# Patient Record
Sex: Female | Born: 1940 | Race: White | Hispanic: No | Marital: Married | Smoking: Never smoker
Health system: Southern US, Community
[De-identification: ages and names within clinical notes are randomized; demographics above are authoritative.]

## PROBLEM LIST (undated history)

## (undated) DIAGNOSIS — F419 Anxiety disorder, unspecified: Secondary | ICD-10-CM

## (undated) DIAGNOSIS — D45 Polycythemia vera: Principal | ICD-10-CM

## (undated) DIAGNOSIS — H9190 Unspecified hearing loss, unspecified ear: Secondary | ICD-10-CM

## (undated) DIAGNOSIS — E785 Hyperlipidemia, unspecified: Secondary | ICD-10-CM

## (undated) DIAGNOSIS — Z9981 Dependence on supplemental oxygen: Secondary | ICD-10-CM

## (undated) DIAGNOSIS — K219 Gastro-esophageal reflux disease without esophagitis: Secondary | ICD-10-CM

## (undated) DIAGNOSIS — L0390 Cellulitis, unspecified: Secondary | ICD-10-CM

## (undated) DIAGNOSIS — I509 Heart failure, unspecified: Secondary | ICD-10-CM

## (undated) DIAGNOSIS — D500 Iron deficiency anemia secondary to blood loss (chronic): Principal | ICD-10-CM

## (undated) DIAGNOSIS — I8390 Asymptomatic varicose veins of unspecified lower extremity: Secondary | ICD-10-CM

## (undated) DIAGNOSIS — E039 Hypothyroidism, unspecified: Secondary | ICD-10-CM

## (undated) DIAGNOSIS — G629 Polyneuropathy, unspecified: Secondary | ICD-10-CM

## (undated) DIAGNOSIS — J449 Chronic obstructive pulmonary disease, unspecified: Secondary | ICD-10-CM

## (undated) DIAGNOSIS — I96 Gangrene, not elsewhere classified: Secondary | ICD-10-CM

## (undated) DIAGNOSIS — J9611 Chronic respiratory failure with hypoxia: Secondary | ICD-10-CM

## (undated) DIAGNOSIS — I10 Essential (primary) hypertension: Secondary | ICD-10-CM

## (undated) DIAGNOSIS — I739 Peripheral vascular disease, unspecified: Secondary | ICD-10-CM

## (undated) DIAGNOSIS — L97309 Non-pressure chronic ulcer of unspecified ankle with unspecified severity: Secondary | ICD-10-CM

## (undated) DIAGNOSIS — I482 Chronic atrial fibrillation, unspecified: Secondary | ICD-10-CM

## (undated) DIAGNOSIS — T148XXA Other injury of unspecified body region, initial encounter: Secondary | ICD-10-CM

## (undated) DIAGNOSIS — M1990 Unspecified osteoarthritis, unspecified site: Secondary | ICD-10-CM

## (undated) DIAGNOSIS — I82409 Acute embolism and thrombosis of unspecified deep veins of unspecified lower extremity: Secondary | ICD-10-CM

## (undated) HISTORY — DX: Peripheral vascular disease, unspecified: I73.9

## (undated) HISTORY — DX: Polycythemia vera: D45

## (undated) HISTORY — DX: Unspecified hearing loss, unspecified ear: H91.90

## (undated) HISTORY — PX: ABDOMINAL HYSTERECTOMY: SHX81

## (undated) HISTORY — DX: Acute embolism and thrombosis of unspecified deep veins of unspecified lower extremity: I82.409

## (undated) HISTORY — PX: TUBAL LIGATION: SHX77

## (undated) HISTORY — DX: Asymptomatic varicose veins of unspecified lower extremity: I83.90

## (undated) HISTORY — DX: Non-pressure chronic ulcer of unspecified ankle with unspecified severity: L97.309

## (undated) HISTORY — DX: Heart failure, unspecified: I50.9

## (undated) HISTORY — PX: CORONARY ANGIOPLASTY: SHX604

## (undated) HISTORY — DX: Iron deficiency anemia secondary to blood loss (chronic): D50.0

## (undated) HISTORY — DX: Unspecified osteoarthritis, unspecified site: M19.90

## (undated) HISTORY — DX: Polyneuropathy, unspecified: G62.9

---

## 1999-05-20 ENCOUNTER — Ambulatory Visit (HOSPITAL_COMMUNITY): Admission: RE | Admit: 1999-05-20 | Discharge: 1999-05-20 | Payer: Self-pay | Admitting: Cardiovascular Disease

## 2004-01-18 ENCOUNTER — Ambulatory Visit (HOSPITAL_COMMUNITY): Admission: RE | Admit: 2004-01-18 | Discharge: 2004-01-18 | Payer: Self-pay | Admitting: Family Medicine

## 2004-01-19 ENCOUNTER — Ambulatory Visit (HOSPITAL_COMMUNITY): Admission: RE | Admit: 2004-01-19 | Discharge: 2004-01-19 | Payer: Self-pay | Admitting: Family Medicine

## 2004-03-25 ENCOUNTER — Encounter (HOSPITAL_COMMUNITY): Admission: RE | Admit: 2004-03-25 | Discharge: 2004-04-24 | Payer: Self-pay | Admitting: Oncology

## 2004-03-25 ENCOUNTER — Encounter: Admission: RE | Admit: 2004-03-25 | Discharge: 2004-03-25 | Payer: Self-pay | Admitting: Oncology

## 2004-04-26 ENCOUNTER — Encounter: Admission: RE | Admit: 2004-04-26 | Discharge: 2004-05-03 | Payer: Self-pay | Admitting: Oncology

## 2004-04-26 ENCOUNTER — Encounter (HOSPITAL_COMMUNITY): Admission: RE | Admit: 2004-04-26 | Discharge: 2004-05-03 | Payer: Self-pay | Admitting: Oncology

## 2004-05-09 ENCOUNTER — Encounter (HOSPITAL_COMMUNITY): Admission: RE | Admit: 2004-05-09 | Discharge: 2004-06-08 | Payer: Self-pay | Admitting: Oncology

## 2004-05-09 ENCOUNTER — Encounter: Admission: RE | Admit: 2004-05-09 | Discharge: 2004-05-09 | Payer: Self-pay | Admitting: Oncology

## 2004-06-26 ENCOUNTER — Encounter (HOSPITAL_COMMUNITY): Admission: RE | Admit: 2004-06-26 | Discharge: 2004-07-26 | Payer: Self-pay | Admitting: Oncology

## 2004-06-26 ENCOUNTER — Ambulatory Visit (HOSPITAL_COMMUNITY): Payer: Self-pay | Admitting: Oncology

## 2004-06-26 ENCOUNTER — Encounter: Admission: RE | Admit: 2004-06-26 | Discharge: 2004-06-26 | Payer: Self-pay | Admitting: Oncology

## 2004-07-31 ENCOUNTER — Encounter (HOSPITAL_COMMUNITY): Admission: RE | Admit: 2004-07-31 | Discharge: 2004-08-30 | Payer: Self-pay | Admitting: Oncology

## 2004-07-31 ENCOUNTER — Encounter: Admission: RE | Admit: 2004-07-31 | Discharge: 2004-07-31 | Payer: Self-pay | Admitting: Oncology

## 2004-08-12 ENCOUNTER — Ambulatory Visit (HOSPITAL_COMMUNITY): Payer: Self-pay | Admitting: Oncology

## 2004-09-06 ENCOUNTER — Encounter (HOSPITAL_COMMUNITY): Admission: RE | Admit: 2004-09-06 | Discharge: 2004-10-06 | Payer: Self-pay | Admitting: Oncology

## 2004-09-06 ENCOUNTER — Encounter: Admission: RE | Admit: 2004-09-06 | Discharge: 2004-09-06 | Payer: Self-pay | Admitting: Oncology

## 2004-10-04 ENCOUNTER — Ambulatory Visit (HOSPITAL_COMMUNITY): Payer: Self-pay | Admitting: Oncology

## 2004-11-01 ENCOUNTER — Encounter: Admission: RE | Admit: 2004-11-01 | Discharge: 2004-11-01 | Payer: Self-pay | Admitting: Oncology

## 2004-11-01 ENCOUNTER — Encounter (HOSPITAL_COMMUNITY): Admission: RE | Admit: 2004-11-01 | Discharge: 2004-12-01 | Payer: Self-pay | Admitting: Oncology

## 2004-11-29 ENCOUNTER — Ambulatory Visit (HOSPITAL_COMMUNITY): Payer: Self-pay | Admitting: Oncology

## 2004-12-27 ENCOUNTER — Encounter: Admission: RE | Admit: 2004-12-27 | Discharge: 2004-12-27 | Payer: Self-pay | Admitting: Oncology

## 2004-12-27 ENCOUNTER — Encounter (HOSPITAL_COMMUNITY): Admission: RE | Admit: 2004-12-27 | Discharge: 2005-01-26 | Payer: Self-pay | Admitting: Oncology

## 2005-01-24 ENCOUNTER — Ambulatory Visit (HOSPITAL_COMMUNITY): Payer: Self-pay | Admitting: Oncology

## 2005-01-30 ENCOUNTER — Ambulatory Visit (HOSPITAL_COMMUNITY): Admission: RE | Admit: 2005-01-30 | Discharge: 2005-01-30 | Payer: Self-pay | Admitting: Family Medicine

## 2005-02-21 ENCOUNTER — Encounter: Admission: RE | Admit: 2005-02-21 | Discharge: 2005-02-21 | Payer: Self-pay | Admitting: Oncology

## 2005-02-21 ENCOUNTER — Encounter (HOSPITAL_COMMUNITY): Admission: RE | Admit: 2005-02-21 | Discharge: 2005-03-23 | Payer: Self-pay | Admitting: Oncology

## 2005-02-24 ENCOUNTER — Ambulatory Visit (HOSPITAL_COMMUNITY): Admission: RE | Admit: 2005-02-24 | Discharge: 2005-02-24 | Payer: Self-pay | Admitting: Family Medicine

## 2005-03-21 ENCOUNTER — Ambulatory Visit (HOSPITAL_COMMUNITY): Payer: Self-pay | Admitting: Oncology

## 2005-04-18 ENCOUNTER — Encounter: Admission: RE | Admit: 2005-04-18 | Discharge: 2005-05-02 | Payer: Self-pay | Admitting: Oncology

## 2005-04-18 ENCOUNTER — Encounter (HOSPITAL_COMMUNITY): Admission: RE | Admit: 2005-04-18 | Discharge: 2005-05-02 | Payer: Self-pay | Admitting: Oncology

## 2005-05-16 ENCOUNTER — Encounter: Admission: RE | Admit: 2005-05-16 | Discharge: 2005-05-16 | Payer: Self-pay | Admitting: Oncology

## 2005-05-16 ENCOUNTER — Encounter (HOSPITAL_COMMUNITY): Admission: RE | Admit: 2005-05-16 | Discharge: 2005-06-15 | Payer: Self-pay | Admitting: Oncology

## 2005-05-16 ENCOUNTER — Ambulatory Visit (HOSPITAL_COMMUNITY): Payer: Self-pay | Admitting: Oncology

## 2005-07-17 ENCOUNTER — Encounter (HOSPITAL_COMMUNITY): Admission: RE | Admit: 2005-07-17 | Discharge: 2005-07-17 | Payer: Self-pay | Admitting: Oncology

## 2005-07-17 ENCOUNTER — Ambulatory Visit (HOSPITAL_COMMUNITY): Payer: Self-pay | Admitting: Oncology

## 2005-07-17 ENCOUNTER — Encounter: Admission: RE | Admit: 2005-07-17 | Discharge: 2005-07-17 | Payer: Self-pay | Admitting: Oncology

## 2005-08-14 ENCOUNTER — Encounter (HOSPITAL_COMMUNITY): Admission: RE | Admit: 2005-08-14 | Discharge: 2005-09-13 | Payer: Self-pay | Admitting: Oncology

## 2005-08-14 ENCOUNTER — Encounter: Admission: RE | Admit: 2005-08-14 | Discharge: 2005-08-14 | Payer: Self-pay | Admitting: Oncology

## 2005-09-08 ENCOUNTER — Ambulatory Visit (HOSPITAL_COMMUNITY): Payer: Self-pay | Admitting: Oncology

## 2005-09-15 ENCOUNTER — Encounter (HOSPITAL_COMMUNITY): Admission: RE | Admit: 2005-09-15 | Discharge: 2005-10-15 | Payer: Self-pay | Admitting: Oncology

## 2005-09-15 ENCOUNTER — Encounter: Admission: RE | Admit: 2005-09-15 | Discharge: 2005-09-15 | Payer: Self-pay | Admitting: Oncology

## 2005-10-27 ENCOUNTER — Encounter (HOSPITAL_COMMUNITY): Admission: RE | Admit: 2005-10-27 | Discharge: 2005-11-26 | Payer: Self-pay | Admitting: Oncology

## 2005-10-27 ENCOUNTER — Ambulatory Visit (HOSPITAL_COMMUNITY): Payer: Self-pay | Admitting: Oncology

## 2005-10-27 ENCOUNTER — Encounter: Admission: RE | Admit: 2005-10-27 | Discharge: 2005-10-27 | Payer: Self-pay | Admitting: Oncology

## 2005-12-08 ENCOUNTER — Encounter (HOSPITAL_COMMUNITY): Admission: RE | Admit: 2005-12-08 | Discharge: 2006-01-07 | Payer: Self-pay | Admitting: Oncology

## 2005-12-08 ENCOUNTER — Encounter: Admission: RE | Admit: 2005-12-08 | Discharge: 2005-12-08 | Payer: Self-pay | Admitting: Oncology

## 2006-01-19 ENCOUNTER — Encounter: Admission: RE | Admit: 2006-01-19 | Discharge: 2006-01-19 | Payer: Self-pay | Admitting: Oncology

## 2006-01-19 ENCOUNTER — Encounter (HOSPITAL_COMMUNITY): Admission: RE | Admit: 2006-01-19 | Discharge: 2006-02-18 | Payer: Self-pay | Admitting: Oncology

## 2006-01-19 ENCOUNTER — Ambulatory Visit (HOSPITAL_COMMUNITY): Payer: Self-pay | Admitting: Oncology

## 2006-02-25 ENCOUNTER — Ambulatory Visit (HOSPITAL_COMMUNITY): Admission: RE | Admit: 2006-02-25 | Discharge: 2006-02-25 | Payer: Self-pay | Admitting: Family Medicine

## 2006-03-02 ENCOUNTER — Encounter (HOSPITAL_COMMUNITY): Admission: RE | Admit: 2006-03-02 | Discharge: 2006-04-01 | Payer: Self-pay | Admitting: Oncology

## 2006-03-02 ENCOUNTER — Encounter: Admission: RE | Admit: 2006-03-02 | Discharge: 2006-03-02 | Payer: Self-pay | Admitting: Oncology

## 2006-03-23 ENCOUNTER — Ambulatory Visit (HOSPITAL_COMMUNITY): Payer: Self-pay | Admitting: Oncology

## 2006-04-13 ENCOUNTER — Encounter: Admission: RE | Admit: 2006-04-13 | Discharge: 2006-05-01 | Payer: Self-pay | Admitting: Oncology

## 2006-04-13 ENCOUNTER — Encounter (HOSPITAL_COMMUNITY): Admission: RE | Admit: 2006-04-13 | Discharge: 2006-05-01 | Payer: Self-pay | Admitting: Oncology

## 2006-05-25 ENCOUNTER — Encounter: Admission: RE | Admit: 2006-05-25 | Discharge: 2006-05-25 | Payer: Self-pay | Admitting: Oncology

## 2006-05-25 ENCOUNTER — Encounter (HOSPITAL_COMMUNITY): Admission: RE | Admit: 2006-05-25 | Discharge: 2006-06-24 | Payer: Self-pay | Admitting: Oncology

## 2006-05-25 ENCOUNTER — Ambulatory Visit (HOSPITAL_COMMUNITY): Payer: Self-pay | Admitting: Oncology

## 2006-07-06 ENCOUNTER — Encounter (HOSPITAL_COMMUNITY): Admission: RE | Admit: 2006-07-06 | Discharge: 2006-08-03 | Payer: Self-pay | Admitting: Oncology

## 2006-08-17 ENCOUNTER — Ambulatory Visit (HOSPITAL_COMMUNITY): Payer: Self-pay | Admitting: Oncology

## 2006-08-17 ENCOUNTER — Encounter (HOSPITAL_COMMUNITY): Admission: RE | Admit: 2006-08-17 | Discharge: 2006-09-16 | Payer: Self-pay | Admitting: Oncology

## 2006-09-28 ENCOUNTER — Encounter (HOSPITAL_COMMUNITY): Admission: RE | Admit: 2006-09-28 | Discharge: 2006-10-28 | Payer: Self-pay | Admitting: Oncology

## 2006-11-09 ENCOUNTER — Encounter (HOSPITAL_COMMUNITY): Admission: RE | Admit: 2006-11-09 | Discharge: 2006-12-09 | Payer: Self-pay | Admitting: Oncology

## 2006-11-09 ENCOUNTER — Ambulatory Visit (HOSPITAL_COMMUNITY): Payer: Self-pay | Admitting: Oncology

## 2006-12-21 ENCOUNTER — Encounter (HOSPITAL_COMMUNITY): Admission: RE | Admit: 2006-12-21 | Discharge: 2007-01-20 | Payer: Self-pay | Admitting: Oncology

## 2007-02-01 ENCOUNTER — Encounter (HOSPITAL_COMMUNITY): Admission: RE | Admit: 2007-02-01 | Discharge: 2007-03-03 | Payer: Self-pay | Admitting: Oncology

## 2007-02-01 ENCOUNTER — Ambulatory Visit (HOSPITAL_COMMUNITY): Payer: Self-pay | Admitting: Oncology

## 2007-03-15 ENCOUNTER — Encounter (HOSPITAL_COMMUNITY): Admission: RE | Admit: 2007-03-15 | Discharge: 2007-04-14 | Payer: Self-pay | Admitting: Oncology

## 2007-03-29 ENCOUNTER — Ambulatory Visit (HOSPITAL_COMMUNITY): Admission: RE | Admit: 2007-03-29 | Discharge: 2007-03-29 | Payer: Self-pay | Admitting: Family Medicine

## 2007-04-12 ENCOUNTER — Ambulatory Visit (HOSPITAL_COMMUNITY): Payer: Self-pay | Admitting: Oncology

## 2007-04-26 ENCOUNTER — Encounter (HOSPITAL_COMMUNITY): Admission: RE | Admit: 2007-04-26 | Discharge: 2007-05-04 | Payer: Self-pay | Admitting: Oncology

## 2007-06-07 ENCOUNTER — Encounter (HOSPITAL_COMMUNITY): Admission: RE | Admit: 2007-06-07 | Discharge: 2007-07-07 | Payer: Self-pay | Admitting: Oncology

## 2007-06-07 ENCOUNTER — Ambulatory Visit (HOSPITAL_COMMUNITY): Payer: Self-pay | Admitting: Oncology

## 2007-07-19 ENCOUNTER — Encounter (HOSPITAL_COMMUNITY): Admission: RE | Admit: 2007-07-19 | Discharge: 2007-08-04 | Payer: Self-pay | Admitting: Oncology

## 2007-08-30 ENCOUNTER — Ambulatory Visit (HOSPITAL_COMMUNITY): Payer: Self-pay | Admitting: Oncology

## 2007-08-30 ENCOUNTER — Encounter (HOSPITAL_COMMUNITY): Admission: RE | Admit: 2007-08-30 | Discharge: 2007-09-29 | Payer: Self-pay | Admitting: Oncology

## 2007-11-22 ENCOUNTER — Encounter (HOSPITAL_COMMUNITY): Admission: RE | Admit: 2007-11-22 | Discharge: 2007-12-22 | Payer: Self-pay | Admitting: Oncology

## 2007-11-22 ENCOUNTER — Ambulatory Visit (HOSPITAL_COMMUNITY): Payer: Self-pay | Admitting: Oncology

## 2008-01-03 ENCOUNTER — Encounter (HOSPITAL_COMMUNITY): Admission: RE | Admit: 2008-01-03 | Discharge: 2008-02-02 | Payer: Self-pay | Admitting: Oncology

## 2008-02-14 ENCOUNTER — Ambulatory Visit (HOSPITAL_COMMUNITY): Payer: Self-pay | Admitting: Oncology

## 2008-02-14 ENCOUNTER — Encounter (HOSPITAL_COMMUNITY): Admission: RE | Admit: 2008-02-14 | Discharge: 2008-03-15 | Payer: Self-pay | Admitting: Oncology

## 2008-03-27 ENCOUNTER — Encounter (HOSPITAL_COMMUNITY): Admission: RE | Admit: 2008-03-27 | Discharge: 2008-04-26 | Payer: Self-pay | Admitting: Oncology

## 2008-03-29 ENCOUNTER — Ambulatory Visit (HOSPITAL_COMMUNITY): Admission: RE | Admit: 2008-03-29 | Discharge: 2008-03-29 | Payer: Self-pay | Admitting: Family Medicine

## 2008-04-11 ENCOUNTER — Ambulatory Visit (HOSPITAL_COMMUNITY): Payer: Self-pay | Admitting: Oncology

## 2008-05-08 ENCOUNTER — Encounter (HOSPITAL_COMMUNITY): Admission: RE | Admit: 2008-05-08 | Discharge: 2008-06-07 | Payer: Self-pay | Admitting: Oncology

## 2008-06-28 ENCOUNTER — Encounter (HOSPITAL_COMMUNITY): Admission: RE | Admit: 2008-06-28 | Discharge: 2008-07-28 | Payer: Self-pay | Admitting: Oncology

## 2008-06-28 ENCOUNTER — Ambulatory Visit (HOSPITAL_COMMUNITY): Payer: Self-pay | Admitting: Oncology

## 2008-08-09 ENCOUNTER — Encounter (HOSPITAL_COMMUNITY): Admission: RE | Admit: 2008-08-09 | Discharge: 2008-09-08 | Payer: Self-pay | Admitting: Oncology

## 2008-09-20 ENCOUNTER — Encounter (HOSPITAL_COMMUNITY): Admission: RE | Admit: 2008-09-20 | Discharge: 2008-10-20 | Payer: Self-pay | Admitting: Oncology

## 2008-09-20 ENCOUNTER — Ambulatory Visit (HOSPITAL_COMMUNITY): Payer: Self-pay | Admitting: Oncology

## 2008-11-28 ENCOUNTER — Encounter (HOSPITAL_COMMUNITY): Admission: RE | Admit: 2008-11-28 | Discharge: 2008-12-28 | Payer: Self-pay | Admitting: Oncology

## 2008-11-28 ENCOUNTER — Ambulatory Visit (HOSPITAL_COMMUNITY): Payer: Self-pay | Admitting: Oncology

## 2009-01-09 ENCOUNTER — Encounter (HOSPITAL_COMMUNITY): Admission: RE | Admit: 2009-01-09 | Discharge: 2009-02-08 | Payer: Self-pay | Admitting: Oncology

## 2009-02-20 ENCOUNTER — Ambulatory Visit (HOSPITAL_COMMUNITY): Payer: Self-pay | Admitting: Oncology

## 2009-02-20 ENCOUNTER — Encounter (HOSPITAL_COMMUNITY): Admission: RE | Admit: 2009-02-20 | Discharge: 2009-03-22 | Payer: Self-pay | Admitting: Oncology

## 2009-03-31 ENCOUNTER — Ambulatory Visit (HOSPITAL_COMMUNITY): Admission: RE | Admit: 2009-03-31 | Discharge: 2009-03-31 | Payer: Self-pay | Admitting: Family Medicine

## 2009-04-03 ENCOUNTER — Encounter (HOSPITAL_COMMUNITY): Admission: RE | Admit: 2009-04-03 | Discharge: 2009-05-02 | Payer: Self-pay | Admitting: Oncology

## 2009-04-23 ENCOUNTER — Ambulatory Visit (HOSPITAL_COMMUNITY): Payer: Self-pay | Admitting: Internal Medicine

## 2009-05-12 ENCOUNTER — Emergency Department (HOSPITAL_COMMUNITY): Admission: EM | Admit: 2009-05-12 | Discharge: 2009-05-12 | Payer: Self-pay | Admitting: Emergency Medicine

## 2009-05-15 ENCOUNTER — Encounter (HOSPITAL_COMMUNITY): Admission: RE | Admit: 2009-05-15 | Discharge: 2009-06-14 | Payer: Self-pay | Admitting: Oncology

## 2009-05-15 ENCOUNTER — Ambulatory Visit (HOSPITAL_COMMUNITY): Payer: Self-pay | Admitting: Oncology

## 2009-06-19 ENCOUNTER — Encounter (HOSPITAL_COMMUNITY): Admission: RE | Admit: 2009-06-19 | Discharge: 2009-07-19 | Payer: Self-pay | Admitting: Oncology

## 2009-07-06 ENCOUNTER — Encounter (INDEPENDENT_AMBULATORY_CARE_PROVIDER_SITE_OTHER): Payer: Self-pay | Admitting: Cardiology

## 2009-07-06 ENCOUNTER — Ambulatory Visit (HOSPITAL_COMMUNITY): Admission: RE | Admit: 2009-07-06 | Discharge: 2009-07-06 | Payer: Self-pay | Admitting: Cardiology

## 2009-07-18 ENCOUNTER — Inpatient Hospital Stay (HOSPITAL_COMMUNITY): Admission: EM | Admit: 2009-07-18 | Discharge: 2009-07-20 | Payer: Self-pay | Admitting: Emergency Medicine

## 2009-07-18 ENCOUNTER — Ambulatory Visit: Payer: Self-pay | Admitting: Cardiology

## 2009-07-19 ENCOUNTER — Ambulatory Visit: Payer: Self-pay | Admitting: Oncology

## 2009-07-19 ENCOUNTER — Encounter (INDEPENDENT_AMBULATORY_CARE_PROVIDER_SITE_OTHER): Payer: Self-pay | Admitting: General Surgery

## 2009-08-16 ENCOUNTER — Ambulatory Visit (HOSPITAL_COMMUNITY): Payer: Self-pay | Admitting: Oncology

## 2009-08-16 ENCOUNTER — Encounter (HOSPITAL_COMMUNITY): Admission: RE | Admit: 2009-08-16 | Discharge: 2009-09-15 | Payer: Self-pay | Admitting: Oncology

## 2009-09-18 ENCOUNTER — Encounter (HOSPITAL_COMMUNITY): Admission: RE | Admit: 2009-09-18 | Discharge: 2009-10-18 | Payer: Self-pay | Admitting: Oncology

## 2009-10-12 ENCOUNTER — Ambulatory Visit (HOSPITAL_COMMUNITY): Payer: Self-pay | Admitting: Oncology

## 2009-10-13 ENCOUNTER — Emergency Department (HOSPITAL_COMMUNITY): Admission: EM | Admit: 2009-10-13 | Discharge: 2009-10-13 | Payer: Self-pay | Admitting: Emergency Medicine

## 2009-11-13 ENCOUNTER — Encounter (HOSPITAL_COMMUNITY): Admission: RE | Admit: 2009-11-13 | Discharge: 2009-12-13 | Payer: Self-pay | Admitting: Oncology

## 2009-12-11 ENCOUNTER — Ambulatory Visit (HOSPITAL_COMMUNITY): Payer: Self-pay | Admitting: Oncology

## 2010-01-08 ENCOUNTER — Encounter (HOSPITAL_COMMUNITY): Admission: RE | Admit: 2010-01-08 | Discharge: 2010-02-07 | Payer: Self-pay | Admitting: Oncology

## 2010-02-06 ENCOUNTER — Ambulatory Visit (HOSPITAL_COMMUNITY): Payer: Self-pay | Admitting: Oncology

## 2010-03-06 ENCOUNTER — Encounter (HOSPITAL_COMMUNITY): Admission: RE | Admit: 2010-03-06 | Discharge: 2010-04-05 | Payer: Self-pay | Admitting: Oncology

## 2010-04-03 ENCOUNTER — Ambulatory Visit (HOSPITAL_COMMUNITY): Payer: Self-pay | Admitting: Oncology

## 2010-04-15 ENCOUNTER — Encounter (HOSPITAL_COMMUNITY)
Admission: RE | Admit: 2010-04-15 | Discharge: 2010-05-03 | Payer: Self-pay | Source: Home / Self Care | Admitting: Oncology

## 2010-05-29 ENCOUNTER — Ambulatory Visit (HOSPITAL_COMMUNITY): Payer: Self-pay | Admitting: Oncology

## 2010-05-29 ENCOUNTER — Encounter (HOSPITAL_COMMUNITY)
Admission: RE | Admit: 2010-05-29 | Discharge: 2010-06-28 | Payer: Self-pay | Source: Home / Self Care | Admitting: Oncology

## 2010-07-23 ENCOUNTER — Ambulatory Visit (HOSPITAL_COMMUNITY): Payer: Self-pay | Admitting: Oncology

## 2010-07-23 ENCOUNTER — Encounter (HOSPITAL_COMMUNITY)
Admission: RE | Admit: 2010-07-23 | Discharge: 2010-08-22 | Payer: Self-pay | Source: Home / Self Care | Attending: Oncology | Admitting: Oncology

## 2010-08-21 LAB — CBC
HCT: 45.6 % (ref 36.0–46.0)
Hemoglobin: 15.5 g/dL — ABNORMAL HIGH (ref 12.0–15.0)
MCH: 34.8 pg — ABNORMAL HIGH (ref 26.0–34.0)
MCHC: 34 g/dL (ref 30.0–36.0)
MCV: 102.2 fL — ABNORMAL HIGH (ref 78.0–100.0)
Platelets: 358 10*3/uL (ref 150–400)
RBC: 4.46 MIL/uL (ref 3.87–5.11)
RDW: 15.6 % — ABNORMAL HIGH (ref 11.5–15.5)
WBC: 7.2 10*3/uL (ref 4.0–10.5)

## 2010-08-26 ENCOUNTER — Encounter: Payer: Self-pay | Admitting: Family Medicine

## 2010-09-17 ENCOUNTER — Other Ambulatory Visit (HOSPITAL_COMMUNITY): Payer: Medicare Other

## 2010-09-17 ENCOUNTER — Encounter (HOSPITAL_COMMUNITY): Payer: Medicare Other | Attending: Oncology

## 2010-09-17 DIAGNOSIS — D45 Polycythemia vera: Secondary | ICD-10-CM | POA: Insufficient documentation

## 2010-09-30 ENCOUNTER — Ambulatory Visit (HOSPITAL_COMMUNITY): Payer: Medicare Other | Admitting: Oncology

## 2010-09-30 DIAGNOSIS — D45 Polycythemia vera: Secondary | ICD-10-CM

## 2010-10-09 ENCOUNTER — Encounter (HOSPITAL_COMMUNITY): Payer: Medicare Other

## 2010-10-09 DIAGNOSIS — D45 Polycythemia vera: Secondary | ICD-10-CM

## 2010-10-14 LAB — CBC
HCT: 44.5 % (ref 36.0–46.0)
Hemoglobin: 15 g/dL (ref 12.0–15.0)
MCH: 34.7 pg — ABNORMAL HIGH (ref 26.0–34.0)
MCHC: 33.7 g/dL (ref 30.0–36.0)
MCV: 103 fL — ABNORMAL HIGH (ref 78.0–100.0)
Platelets: 299 10*3/uL (ref 150–400)
RBC: 4.32 MIL/uL (ref 3.87–5.11)
RDW: 16.5 % — ABNORMAL HIGH (ref 11.5–15.5)
WBC: 5.9 10*3/uL (ref 4.0–10.5)

## 2010-10-15 LAB — CBC
HCT: 44.3 % (ref 36.0–46.0)
Hemoglobin: 15.1 g/dL — ABNORMAL HIGH (ref 12.0–15.0)
MCH: 34.7 pg — ABNORMAL HIGH (ref 26.0–34.0)
MCHC: 34.1 g/dL (ref 30.0–36.0)
MCV: 101.8 fL — ABNORMAL HIGH (ref 78.0–100.0)
Platelets: 383 10*3/uL (ref 150–400)
RBC: 4.35 MIL/uL (ref 3.87–5.11)
RDW: 16.8 % — ABNORMAL HIGH (ref 11.5–15.5)
WBC: 7.5 10*3/uL (ref 4.0–10.5)

## 2010-10-16 LAB — CBC
HCT: 45.9 % (ref 36.0–46.0)
Hemoglobin: 14.9 g/dL (ref 12.0–15.0)
MCH: 34 pg (ref 26.0–34.0)
MCHC: 32.4 g/dL (ref 30.0–36.0)
MCV: 105 fL — ABNORMAL HIGH (ref 78.0–100.0)
Platelets: 400 10*3/uL (ref 150–400)
RBC: 4.37 MIL/uL (ref 3.87–5.11)
RDW: 18.1 % — ABNORMAL HIGH (ref 11.5–15.5)
WBC: 6.4 10*3/uL (ref 4.0–10.5)

## 2010-10-17 LAB — CBC
HCT: 46.4 % — ABNORMAL HIGH (ref 36.0–46.0)
HCT: 46.5 % — ABNORMAL HIGH (ref 36.0–46.0)
Hemoglobin: 14.8 g/dL (ref 12.0–15.0)
Hemoglobin: 15.1 g/dL — ABNORMAL HIGH (ref 12.0–15.0)
MCH: 33.3 pg (ref 26.0–34.0)
MCH: 33.8 pg (ref 26.0–34.0)
MCHC: 32 g/dL (ref 30.0–36.0)
MCHC: 32.5 g/dL (ref 30.0–36.0)
MCV: 103.7 fL — ABNORMAL HIGH (ref 78.0–100.0)
MCV: 104.2 fL — ABNORMAL HIGH (ref 78.0–100.0)
Platelets: 466 10*3/uL — ABNORMAL HIGH (ref 150–400)
Platelets: 481 10*3/uL — ABNORMAL HIGH (ref 150–400)
RBC: 4.46 MIL/uL (ref 3.87–5.11)
RBC: 4.49 MIL/uL (ref 3.87–5.11)
RDW: 17 % — ABNORMAL HIGH (ref 11.5–15.5)
RDW: 17 % — ABNORMAL HIGH (ref 11.5–15.5)
WBC: 7.1 10*3/uL (ref 4.0–10.5)
WBC: 8 10*3/uL (ref 4.0–10.5)

## 2010-10-18 LAB — CBC
HCT: 45 % (ref 36.0–46.0)
Hemoglobin: 14.6 g/dL (ref 12.0–15.0)
MCH: 34 pg (ref 26.0–34.0)
MCHC: 32.4 g/dL (ref 30.0–36.0)
MCV: 104.9 fL — ABNORMAL HIGH (ref 78.0–100.0)
Platelets: 468 10*3/uL — ABNORMAL HIGH (ref 150–400)
RBC: 4.29 MIL/uL (ref 3.87–5.11)
RDW: 16.8 % — ABNORMAL HIGH (ref 11.5–15.5)
WBC: 7.5 10*3/uL (ref 4.0–10.5)

## 2010-10-20 LAB — CBC
HCT: 41.5 % (ref 36.0–46.0)
HCT: 45.8 % (ref 36.0–46.0)
Hemoglobin: 13.7 g/dL (ref 12.0–15.0)
Hemoglobin: 14.6 g/dL (ref 12.0–15.0)
MCH: 34.3 pg — ABNORMAL HIGH (ref 26.0–34.0)
MCHC: 32.9 g/dL (ref 30.0–36.0)
MCHC: 32 g/dL (ref 30.0–36.0)
MCV: 104.2 fL — ABNORMAL HIGH (ref 78.0–100.0)
MCV: 95 fL (ref 78.0–100.0)
Platelets: 398 10*3/uL (ref 150–400)
Platelets: 314 10*3/uL (ref 150–400)
RBC: 3.98 MIL/uL (ref 3.87–5.11)
RBC: 4.82 MIL/uL (ref 3.87–5.11)
RDW: 18.1 % — ABNORMAL HIGH (ref 11.5–15.5)
RDW: 19.2 % — ABNORMAL HIGH (ref 11.5–15.5)
WBC: 6.6 10*3/uL (ref 4.0–10.5)
WBC: 7.3 10*3/uL (ref 4.0–10.5)

## 2010-10-21 LAB — CBC
HCT: 43.5 % (ref 36.0–46.0)
Hemoglobin: 14.3 g/dL (ref 12.0–15.0)
MCHC: 32.8 g/dL (ref 30.0–36.0)
MCV: 102.3 fL — ABNORMAL HIGH (ref 78.0–100.0)
Platelets: 316 10*3/uL (ref 150–400)
RBC: 4.26 MIL/uL (ref 3.87–5.11)
RDW: 18.5 % — ABNORMAL HIGH (ref 11.5–15.5)
WBC: 7.6 10*3/uL (ref 4.0–10.5)

## 2010-10-22 LAB — CBC
HCT: 43.4 % (ref 36.0–46.0)
Hemoglobin: 14.6 g/dL (ref 12.0–15.0)
MCHC: 33.5 g/dL (ref 30.0–36.0)
MCV: 98.9 fL (ref 78.0–100.0)
Platelets: 259 10*3/uL (ref 150–400)
RBC: 4.39 MIL/uL (ref 3.87–5.11)
RDW: 19.6 % — ABNORMAL HIGH (ref 11.5–15.5)
WBC: 6.1 10*3/uL (ref 4.0–10.5)

## 2010-10-23 LAB — CBC
Hemoglobin: 14.8 g/dL (ref 12.0–15.0)
Hemoglobin: 15.2 g/dL — ABNORMAL HIGH (ref 12.0–15.0)
MCHC: 32.3 g/dL (ref 30.0–36.0)
RBC: 4.94 MIL/uL (ref 3.87–5.11)
RDW: 19.9 % — ABNORMAL HIGH (ref 11.5–15.5)
WBC: 6.6 10*3/uL (ref 4.0–10.5)

## 2010-10-23 LAB — THERAPEUTIC PHLEBOTOMY

## 2010-10-27 LAB — DIFFERENTIAL
Basophils Relative: 0 % (ref 0–1)
Lymphs Abs: 0.8 10*3/uL (ref 0.7–4.0)
Monocytes Relative: 3 % (ref 3–12)
Neutro Abs: 6.7 10*3/uL (ref 1.7–7.7)
Neutrophils Relative %: 86 % — ABNORMAL HIGH (ref 43–77)

## 2010-10-27 LAB — CBC
HCT: 43.3 % (ref 36.0–46.0)
HCT: 43.7 % (ref 36.0–46.0)
Hemoglobin: 14.2 g/dL (ref 12.0–15.0)
Hemoglobin: 14.5 g/dL (ref 12.0–15.0)
MCHC: 33.2 g/dL (ref 30.0–36.0)
RDW: 18.1 % — ABNORMAL HIGH (ref 11.5–15.5)
RDW: 18.7 % — ABNORMAL HIGH (ref 11.5–15.5)
WBC: 6.7 10*3/uL (ref 4.0–10.5)

## 2010-10-27 LAB — COMPREHENSIVE METABOLIC PANEL
BUN: 15 mg/dL (ref 6–23)
Calcium: 9.2 mg/dL (ref 8.4–10.5)
Creatinine, Ser: 0.74 mg/dL (ref 0.4–1.2)
Glucose, Bld: 117 mg/dL — ABNORMAL HIGH (ref 70–99)
Total Protein: 7 g/dL (ref 6.0–8.3)

## 2010-10-27 LAB — PROTIME-INR
INR: 1.65 — ABNORMAL HIGH (ref 0.00–1.49)
Prothrombin Time: 19.4 seconds — ABNORMAL HIGH (ref 11.6–15.2)

## 2010-10-28 ENCOUNTER — Other Ambulatory Visit (HOSPITAL_COMMUNITY): Payer: Medicare Other

## 2010-10-29 ENCOUNTER — Other Ambulatory Visit (HOSPITAL_COMMUNITY): Payer: Medicare Other

## 2010-11-04 LAB — DIFFERENTIAL
Basophils Absolute: 0 10*3/uL (ref 0.0–0.1)
Eosinophils Absolute: 0 10*3/uL (ref 0.0–0.7)
Eosinophils Absolute: 0 10*3/uL (ref 0.0–0.7)
Eosinophils Relative: 0 % (ref 0–5)
Eosinophils Relative: 0 % (ref 0–5)
Lymphs Abs: 1.1 10*3/uL (ref 0.7–4.0)
Monocytes Absolute: 0.3 10*3/uL (ref 0.1–1.0)
Monocytes Absolute: 0.4 10*3/uL (ref 0.1–1.0)
Monocytes Relative: 6 % (ref 3–12)

## 2010-11-04 LAB — COMPREHENSIVE METABOLIC PANEL
ALT: 9 U/L (ref 0–35)
AST: 16 U/L (ref 0–37)
Albumin: 3.3 g/dL — ABNORMAL LOW (ref 3.5–5.2)
CO2: 25 mEq/L (ref 19–32)
Calcium: 9.1 mg/dL (ref 8.4–10.5)
GFR calc Af Amer: >60 mL/min (ref >60)
GFR calc non Af Amer: >60 mL/min (ref >60)
Sodium: 138 mEq/L (ref 135–145)

## 2010-11-04 LAB — LIPID PANEL
Cholesterol: 119 mg/dL (ref 0–200)
LDL Cholesterol: 62 mg/dL (ref 0–99)
Triglycerides: 124 mg/dL (ref <150)

## 2010-11-04 LAB — PROTIME-INR
INR: 1.36 (ref 0.00–1.49)
Prothrombin Time: 18.1 seconds — ABNORMAL HIGH (ref 11.6–15.2)

## 2010-11-04 LAB — CBC
HCT: 41.8 % (ref 36.0–46.0)
Hemoglobin: 13.6 g/dL (ref 12.0–15.0)
MCHC: 32.5 g/dL (ref 30.0–36.0)
MCHC: 32.5 g/dL (ref 30.0–36.0)
MCV: 96 fL (ref 78.0–100.0)
Platelets: 300 10*3/uL (ref 150–400)
RBC: 4.28 MIL/uL (ref 3.87–5.11)
RDW: 20.6 % — ABNORMAL HIGH (ref 11.5–15.5)
WBC: 6.6 10*3/uL (ref 4.0–10.5)

## 2010-11-04 LAB — APTT: aPTT: 35 seconds (ref 24–37)

## 2010-11-04 LAB — MAGNESIUM: Magnesium: 2 mg/dL (ref 1.5–2.5)

## 2010-11-05 LAB — COMPREHENSIVE METABOLIC PANEL
Albumin: 3.8 g/dL (ref 3.5–5.2)
Alkaline Phosphatase: 73 U/L (ref 39–117)
BUN: 13 mg/dL (ref 6–23)
Chloride: 102 mEq/L (ref 96–112)
Potassium: 4.2 mEq/L (ref 3.5–5.1)
Total Bilirubin: 1.4 mg/dL — ABNORMAL HIGH (ref 0.3–1.2)

## 2010-11-05 LAB — URINALYSIS, ROUTINE W REFLEX MICROSCOPIC
Glucose, UA: NEGATIVE mg/dL
Hgb urine dipstick: NEGATIVE
Ketones, ur: NEGATIVE mg/dL
Protein, ur: NEGATIVE mg/dL
pH: 6 (ref 5.0–8.0)

## 2010-11-05 LAB — URINE MICROSCOPIC-ADD ON

## 2010-11-05 LAB — DIFFERENTIAL
Basophils Absolute: 0 10*3/uL (ref 0.0–0.1)
Basophils Relative: 0 % (ref 0–1)
Eosinophils Absolute: 0 10*3/uL (ref 0.0–0.7)
Eosinophils Relative: 1 % (ref 0–5)
Monocytes Absolute: 0.3 10*3/uL (ref 0.1–1.0)
Neutro Abs: 6.6 10*3/uL (ref 1.7–7.7)

## 2010-11-05 LAB — CBC
HCT: 46.6 % — ABNORMAL HIGH (ref 36.0–46.0)
Hemoglobin: 15.1 g/dL — ABNORMAL HIGH (ref 12.0–15.0)
Platelets: 315 10*3/uL (ref 150–400)
WBC: 8.2 10*3/uL (ref 4.0–10.5)

## 2010-11-06 ENCOUNTER — Other Ambulatory Visit (HOSPITAL_COMMUNITY): Payer: Self-pay | Admitting: Oncology

## 2010-11-06 ENCOUNTER — Ambulatory Visit (HOSPITAL_COMMUNITY): Payer: Medicare Other

## 2010-11-06 ENCOUNTER — Encounter (HOSPITAL_COMMUNITY): Payer: Medicare Other | Attending: Oncology

## 2010-11-06 DIAGNOSIS — D45 Polycythemia vera: Secondary | ICD-10-CM

## 2010-11-06 LAB — CBC
HCT: 43.7 % (ref 36.0–46.0)
Hemoglobin: 14.2 g/dL (ref 12.0–15.0)
MCH: 32.2 pg (ref 26.0–34.0)
MCHC: 32.4 g/dL (ref 30.0–36.0)
MCHC: 31.7 g/dL (ref 30.0–36.0)
MCV: 91.5 fL (ref 78.0–100.0)
Platelets: 388 10*3/uL (ref 150–400)
Platelets: 379 10*3/uL (ref 150–400)
RDW: 18.5 % — ABNORMAL HIGH (ref 11.5–15.5)
RDW: 19.1 % — ABNORMAL HIGH (ref 11.5–15.5)
WBC: 6.9 10*3/uL (ref 4.0–10.5)

## 2010-11-07 LAB — URINE MICROSCOPIC-ADD ON

## 2010-11-07 LAB — COMPREHENSIVE METABOLIC PANEL
ALT: 15 U/L (ref 0–35)
Alkaline Phosphatase: 72 U/L (ref 39–117)
CO2: 27 mEq/L (ref 19–32)
Calcium: 10 mg/dL (ref 8.4–10.5)
Chloride: 105 mEq/L (ref 96–112)
GFR calc non Af Amer: 60 mL/min — ABNORMAL LOW (ref >60)
Glucose, Bld: 86 mg/dL (ref 70–99)
Sodium: 139 mEq/L (ref 135–145)
Total Bilirubin: 0.8 mg/dL (ref 0.3–1.2)

## 2010-11-07 LAB — DIFFERENTIAL
Basophils Absolute: 0 10*3/uL (ref 0.0–0.1)
Basophils Absolute: 0.1 10*3/uL (ref 0.0–0.1)
Basophils Relative: 1 % (ref 0–1)
Basophils Relative: 1 % (ref 0–1)
Basophils Relative: 1 % (ref 0–1)
Eosinophils Absolute: 0.1 10*3/uL (ref 0.0–0.7)
Eosinophils Relative: 1 % (ref 0–5)
Lymphocytes Relative: 22 % (ref 12–46)
Lymphs Abs: 1.4 10*3/uL (ref 0.7–4.0)
Lymphs Abs: 1.9 10*3/uL (ref 0.7–4.0)
Monocytes Absolute: 0.3 10*3/uL (ref 0.1–1.0)
Monocytes Relative: 5 % (ref 3–12)
Neutro Abs: 6.7 10*3/uL (ref 1.7–7.7)
Neutrophils Relative %: 71 % (ref 43–77)
Neutrophils Relative %: 74 % (ref 43–77)
Neutrophils Relative %: 77 % (ref 43–77)

## 2010-11-07 LAB — BASIC METABOLIC PANEL
CO2: 24 mEq/L (ref 19–32)
Calcium: 9.9 mg/dL (ref 8.4–10.5)
Creatinine, Ser: 0.97 mg/dL (ref 0.4–1.2)
GFR calc Af Amer: >60 mL/min (ref >60)
Glucose, Bld: 180 mg/dL — ABNORMAL HIGH (ref 70–99)

## 2010-11-07 LAB — CBC
Hemoglobin: 14.1 g/dL (ref 12.0–15.0)
Hemoglobin: 16 g/dL — ABNORMAL HIGH (ref 12.0–15.0)
MCH: 33 pg (ref 26.0–34.0)
MCHC: 29.6 g/dL — ABNORMAL LOW (ref 30.0–36.0)
MCHC: 32.5 g/dL (ref 30.0–36.0)
MCHC: 32.5 g/dL (ref 30.0–36.0)
RBC: 5.28 MIL/uL — ABNORMAL HIGH (ref 3.87–5.11)
RBC: 5.33 MIL/uL — ABNORMAL HIGH (ref 3.87–5.11)
RDW: 17.9 % — ABNORMAL HIGH (ref 11.5–15.5)
RDW: 18.1 % — ABNORMAL HIGH (ref 11.5–15.5)

## 2010-11-07 LAB — POCT CARDIAC MARKERS
CKMB, poc: 1.1 ng/mL (ref 1.0–8.0)
CKMB, poc: <1 ng/mL — ABNORMAL LOW (ref 1.0–8.0)
Myoglobin, poc: 88.3 ng/mL (ref 12–200)

## 2010-11-07 LAB — URINALYSIS, ROUTINE W REFLEX MICROSCOPIC
Nitrite: NEGATIVE
Specific Gravity, Urine: 1.02 (ref 1.005–1.030)
Urobilinogen, UA: 1 mg/dL (ref 0.0–1.0)

## 2010-11-07 LAB — URINE CULTURE: Culture: NO GROWTH

## 2010-11-09 LAB — DIFFERENTIAL
Lymphocytes Relative: 21 % (ref 12–46)
Lymphs Abs: 1.5 10*3/uL (ref 0.7–4.0)
Monocytes Relative: 3 % (ref 3–12)
Neutro Abs: 5.2 10*3/uL (ref 1.7–7.7)
Neutrophils Relative %: 74 % (ref 43–77)

## 2010-11-09 LAB — CBC
RBC: 5.21 MIL/uL — ABNORMAL HIGH (ref 3.87–5.11)
WBC: 7 10*3/uL (ref 4.0–10.5)

## 2010-11-10 LAB — DIFFERENTIAL
Basophils Relative: 1 % (ref 0–1)
Eosinophils Absolute: 0.1 10*3/uL (ref 0.0–0.7)
Lymphs Abs: 1.5 10*3/uL (ref 0.7–4.0)
Monocytes Absolute: 0.3 10*3/uL (ref 0.1–1.0)
Monocytes Relative: 4 % (ref 3–12)
Neutro Abs: 5.4 10*3/uL (ref 1.7–7.7)

## 2010-11-10 LAB — CBC
Hemoglobin: 14 g/dL (ref 12.0–15.0)
MCHC: 33.1 g/dL (ref 30.0–36.0)
MCV: 90.7 fL (ref 78.0–100.0)
RBC: 4.67 MIL/uL (ref 3.87–5.11)
WBC: 7.4 10*3/uL (ref 4.0–10.5)

## 2010-11-11 LAB — CBC
Hemoglobin: 16 g/dL — ABNORMAL HIGH (ref 12.0–15.0)
MCHC: 33.4 g/dL (ref 30.0–36.0)
MCV: 91.1 fL (ref 78.0–100.0)
RDW: 18.9 % — ABNORMAL HIGH (ref 11.5–15.5)

## 2010-11-11 LAB — DIFFERENTIAL
Basophils Absolute: 0 10*3/uL (ref 0.0–0.1)
Eosinophils Absolute: 0.1 10*3/uL (ref 0.0–0.7)
Eosinophils Relative: 1 % (ref 0–5)
Monocytes Absolute: 0.5 10*3/uL (ref 0.1–1.0)

## 2010-11-11 LAB — THERAPEUTIC PHLEBOTOMY

## 2010-11-13 LAB — CBC
MCHC: 32.5 g/dL (ref 30.0–36.0)
MCV: 91 fL (ref 78.0–100.0)
RDW: 17.9 % — ABNORMAL HIGH (ref 11.5–15.5)

## 2010-11-13 LAB — DIFFERENTIAL
Eosinophils Relative: 1 % (ref 0–5)
Neutro Abs: 6.5 10*3/uL (ref 1.7–7.7)
Neutrophils Relative %: 78 % — ABNORMAL HIGH (ref 43–77)

## 2010-11-14 LAB — CBC
Hemoglobin: 14.2 g/dL (ref 12.0–15.0)
MCHC: 32.4 g/dL (ref 30.0–36.0)
MCV: 90.9 fL (ref 78.0–100.0)
RBC: 4.8 MIL/uL (ref 3.87–5.11)

## 2010-11-14 LAB — BASIC METABOLIC PANEL
CO2: 26 mEq/L (ref 19–32)
Chloride: 100 mEq/L (ref 96–112)
Creatinine, Ser: 0.76 mg/dL (ref 0.4–1.2)
GFR calc Af Amer: >60 mL/min (ref >60)

## 2010-11-14 LAB — DIFFERENTIAL
Basophils Relative: 1 % (ref 0–1)
Eosinophils Absolute: 0.1 10*3/uL (ref 0.0–0.7)
Monocytes Absolute: 0.4 10*3/uL (ref 0.1–1.0)
Monocytes Relative: 5 % (ref 3–12)
Neutrophils Relative %: 73 % (ref 43–77)

## 2010-11-18 LAB — CBC
MCHC: 32.2 g/dL (ref 30.0–36.0)
Platelets: 453 10*3/uL — ABNORMAL HIGH (ref 150–400)
RDW: 17.3 % — ABNORMAL HIGH (ref 11.5–15.5)

## 2010-11-18 LAB — DIFFERENTIAL
Basophils Absolute: 0.1 10*3/uL (ref 0.0–0.1)
Basophils Relative: 1 % (ref 0–1)
Neutro Abs: 5.4 10*3/uL (ref 1.7–7.7)
Neutrophils Relative %: 71 % (ref 43–77)

## 2010-11-19 LAB — CBC
Hemoglobin: 14.1 g/dL (ref 12.0–15.0)
RBC: 4.76 MIL/uL (ref 3.87–5.11)

## 2010-11-19 LAB — DIFFERENTIAL
Basophils Absolute: 0.1 10*3/uL (ref 0.0–0.1)
Lymphocytes Relative: 24 % (ref 12–46)
Monocytes Absolute: 0.4 10*3/uL (ref 0.1–1.0)
Monocytes Relative: 5 % (ref 3–12)
Neutro Abs: 5 10*3/uL (ref 1.7–7.7)

## 2010-11-28 ENCOUNTER — Other Ambulatory Visit (HOSPITAL_COMMUNITY): Payer: Medicare Other

## 2010-12-04 ENCOUNTER — Other Ambulatory Visit (HOSPITAL_COMMUNITY): Payer: Self-pay | Admitting: Oncology

## 2010-12-04 ENCOUNTER — Encounter (HOSPITAL_COMMUNITY): Payer: Medicare Other | Attending: Oncology

## 2010-12-04 DIAGNOSIS — D45 Polycythemia vera: Secondary | ICD-10-CM | POA: Insufficient documentation

## 2010-12-06 LAB — CBC
HCT: 43.6 % (ref 36.0–46.0)
Hemoglobin: 13.9 g/dL (ref 12.0–15.0)
MCHC: 31.9 g/dL (ref 30.0–36.0)

## 2010-12-06 LAB — DIFFERENTIAL
Basophils Relative: 1 % (ref 0–1)
Lymphs Abs: 1.7 10*3/uL (ref 0.7–4.0)
Monocytes Absolute: 0.3 10*3/uL (ref 0.1–1.0)
Monocytes Relative: 5 % (ref 3–12)
Neutro Abs: 4.8 10*3/uL (ref 1.7–7.7)

## 2010-12-26 ENCOUNTER — Other Ambulatory Visit (HOSPITAL_COMMUNITY): Payer: Medicare Other

## 2011-01-01 ENCOUNTER — Other Ambulatory Visit (HOSPITAL_COMMUNITY): Payer: Self-pay | Admitting: Oncology

## 2011-01-01 ENCOUNTER — Encounter (HOSPITAL_COMMUNITY): Payer: Medicare Other

## 2011-01-01 DIAGNOSIS — D45 Polycythemia vera: Secondary | ICD-10-CM

## 2011-01-01 LAB — DIFFERENTIAL
Basophils Relative: 1 % (ref 0–1)
Eosinophils Absolute: 0.1 10*3/uL (ref 0.0–0.7)
Eosinophils Relative: 1 % (ref 0–5)
Monocytes Absolute: 0.3 10*3/uL (ref 0.1–1.0)
Monocytes Relative: 5 % (ref 3–12)
Neutrophils Relative %: 66 % (ref 43–77)

## 2011-01-01 LAB — CBC
HCT: 43 % (ref 36.0–46.0)
Hemoglobin: 13.9 g/dL (ref 12.0–15.0)
MCH: 32.1 pg (ref 26.0–34.0)
MCHC: 32.3 g/dL (ref 30.0–36.0)
MCV: 99.3 fL (ref 78.0–100.0)

## 2011-01-23 ENCOUNTER — Ambulatory Visit (INDEPENDENT_AMBULATORY_CARE_PROVIDER_SITE_OTHER): Payer: Medicare Other | Admitting: Otolaryngology

## 2011-01-23 DIAGNOSIS — R070 Pain in throat: Secondary | ICD-10-CM

## 2011-01-23 DIAGNOSIS — K2100 Gastro-esophageal reflux disease with esophagitis, without bleeding: Secondary | ICD-10-CM

## 2011-01-23 DIAGNOSIS — H903 Sensorineural hearing loss, bilateral: Secondary | ICD-10-CM

## 2011-01-29 ENCOUNTER — Other Ambulatory Visit (HOSPITAL_COMMUNITY): Payer: Self-pay | Admitting: Oncology

## 2011-01-29 ENCOUNTER — Encounter (HOSPITAL_COMMUNITY): Payer: Medicare Other | Attending: Oncology

## 2011-01-29 DIAGNOSIS — D45 Polycythemia vera: Secondary | ICD-10-CM | POA: Insufficient documentation

## 2011-01-29 LAB — DIFFERENTIAL
Basophils Absolute: 0.1 10*3/uL (ref 0.0–0.1)
Basophils Relative: 1 % (ref 0–1)
Eosinophils Absolute: 0.1 10*3/uL (ref 0.0–0.7)
Eosinophils Relative: 1 % (ref 0–5)
Monocytes Absolute: 0.5 10*3/uL (ref 0.1–1.0)
Monocytes Relative: 6 % (ref 3–12)

## 2011-01-29 LAB — CBC
MCH: 32 pg (ref 26.0–34.0)
MCHC: 32.2 g/dL (ref 30.0–36.0)
RDW: 16.7 % — ABNORMAL HIGH (ref 11.5–15.5)

## 2011-02-26 ENCOUNTER — Other Ambulatory Visit (HOSPITAL_COMMUNITY): Payer: Self-pay | Admitting: Oncology

## 2011-02-26 ENCOUNTER — Encounter (HOSPITAL_COMMUNITY): Payer: Medicare Other | Attending: Oncology

## 2011-02-26 ENCOUNTER — Telehealth (HOSPITAL_COMMUNITY): Payer: Self-pay

## 2011-02-26 DIAGNOSIS — D45 Polycythemia vera: Secondary | ICD-10-CM

## 2011-02-26 LAB — CBC
Platelets: 441 10*3/uL — ABNORMAL HIGH (ref 150–400)
RBC: 4.55 MIL/uL (ref 3.87–5.11)
RDW: 16.9 % — ABNORMAL HIGH (ref 11.5–15.5)
WBC: 7.9 10*3/uL (ref 4.0–10.5)

## 2011-02-26 LAB — DIFFERENTIAL
Basophils Absolute: 0.1 10*3/uL (ref 0.0–0.1)
Eosinophils Absolute: 0.1 10*3/uL (ref 0.0–0.7)
Eosinophils Relative: 1 % (ref 0–5)
Lymphocytes Relative: 20 % (ref 12–46)
Lymphs Abs: 1.6 10*3/uL (ref 0.7–4.0)
Neutrophils Relative %: 74 % (ref 43–77)

## 2011-02-26 NOTE — Progress Notes (Signed)
Labs drawn today for cbc/diff 

## 2011-02-26 NOTE — Telephone Encounter (Signed)
Patient notified to continue same dose of Hydrea and labs as ordered.

## 2011-03-26 ENCOUNTER — Encounter (HOSPITAL_COMMUNITY): Payer: Medicare Other | Attending: Oncology

## 2011-03-26 DIAGNOSIS — R51 Headache: Secondary | ICD-10-CM | POA: Insufficient documentation

## 2011-03-26 DIAGNOSIS — D45 Polycythemia vera: Secondary | ICD-10-CM

## 2011-03-26 LAB — DIFFERENTIAL
Basophils Absolute: 0.1 10*3/uL (ref 0.0–0.1)
Eosinophils Relative: 1 % (ref 0–5)
Lymphocytes Relative: 23 % (ref 12–46)
Lymphs Abs: 1.7 10*3/uL (ref 0.7–4.0)
Neutro Abs: 5.2 10*3/uL (ref 1.7–7.7)

## 2011-03-26 LAB — CBC
HCT: 45.4 % (ref 36.0–46.0)
MCV: 97.2 fL (ref 78.0–100.0)
Platelets: 308 10*3/uL (ref 150–400)
RBC: 4.67 MIL/uL (ref 3.87–5.11)
RDW: 17.1 % — ABNORMAL HIGH (ref 11.5–15.5)
WBC: 7.3 10*3/uL (ref 4.0–10.5)

## 2011-03-26 NOTE — Progress Notes (Signed)
Labs drawn today for cbc,diff 

## 2011-03-28 ENCOUNTER — Encounter (HOSPITAL_COMMUNITY): Payer: Self-pay | Admitting: Oncology

## 2011-03-28 ENCOUNTER — Encounter (HOSPITAL_BASED_OUTPATIENT_CLINIC_OR_DEPARTMENT_OTHER): Payer: Medicare Other | Admitting: Oncology

## 2011-03-28 DIAGNOSIS — G8929 Other chronic pain: Secondary | ICD-10-CM

## 2011-03-28 DIAGNOSIS — D45 Polycythemia vera: Secondary | ICD-10-CM

## 2011-03-28 DIAGNOSIS — R51 Headache: Secondary | ICD-10-CM
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
## 2011-03-28 NOTE — Progress Notes (Signed)
This office note has been dictated.
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
## 2011-04-01 ENCOUNTER — Encounter (HOSPITAL_COMMUNITY): Payer: Self-pay

## 2011-04-01 ENCOUNTER — Ambulatory Visit (HOSPITAL_COMMUNITY)
Admission: RE | Admit: 2011-04-01 | Discharge: 2011-04-01 | Disposition: A | Discharge status: Home / Self Care | Payer: Medicare Other | Source: Ambulatory Visit | Attending: Oncology | Admitting: Oncology

## 2011-04-01 DIAGNOSIS — G8929 Other chronic pain: Secondary | ICD-10-CM

## 2011-04-01 DIAGNOSIS — R51 Headache: Secondary | ICD-10-CM | POA: Insufficient documentation

## 2011-04-02 ENCOUNTER — Telehealth (HOSPITAL_COMMUNITY): Payer: Self-pay | Admitting: *Deleted

## 2011-04-02 NOTE — Telephone Encounter (Signed)
Message left as below.
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
## 2011-04-24 ENCOUNTER — Encounter (HOSPITAL_COMMUNITY): Payer: Medicare Other | Attending: Oncology

## 2011-04-24 DIAGNOSIS — D45 Polycythemia vera: Secondary | ICD-10-CM | POA: Insufficient documentation

## 2011-04-24 LAB — DIFFERENTIAL
Basophils Absolute: 0.1
Eosinophils Absolute: 0.1
Eosinophils Relative: 1
Lymphocytes Relative: 22 % (ref 12–46)
Lymphocytes Relative: 22
Lymphs Abs: 1.5 10*3/uL (ref 0.7–4.0)
Monocytes Absolute: 0.4
Monocytes Relative: 5 % (ref 3–12)
Neutro Abs: 5 10*3/uL (ref 1.7–7.7)
Neutrophils Relative %: 71 % (ref 43–77)

## 2011-04-24 LAB — CBC
Hemoglobin: 14.4 g/dL (ref 12.0–15.0)
Hemoglobin: 15.4 — ABNORMAL HIGH
MCHC: 33.2
MCV: 94.3
RBC: 4.56 MIL/uL (ref 3.87–5.11)
RBC: 4.91
WBC: 7 10*3/uL (ref 4.0–10.5)
WBC: 7.9

## 2011-04-24 NOTE — Progress Notes (Signed)
Labs drawn today for cbc/diff 

## 2011-04-29 LAB — CBC
HCT: 46.7 — ABNORMAL HIGH
MCV: 94.2
Platelets: 416 — ABNORMAL HIGH
RDW: 17.8 — ABNORMAL HIGH
WBC: 7.9

## 2011-04-29 LAB — THERAPEUTIC PHLEBOTOMY: Volume Drawn: 500

## 2011-04-29 LAB — DIFFERENTIAL
Basophils Absolute: 0
Eosinophils Absolute: 0.1
Eosinophils Relative: 1
Lymphocytes Relative: 22
Neutrophils Relative %: 72

## 2011-05-01 LAB — DIFFERENTIAL
Basophils Absolute: 0
Basophils Absolute: 0.1
Basophils Relative: 0
Lymphocytes Relative: 23
Lymphocytes Relative: 24
Lymphs Abs: 1.6
Monocytes Absolute: 0.3
Monocytes Relative: 5
Neutro Abs: 5.3
Neutro Abs: 4.8
Neutrophils Relative %: 70

## 2011-05-01 LAB — CBC
Hemoglobin: 14.8
MCHC: 34.1
RBC: 4.76
RDW: 16.9 — ABNORMAL HIGH
WBC: 6.9

## 2011-05-05 LAB — DIFFERENTIAL
Eosinophils Absolute: 0.1
Lymphs Abs: 1.7
Monocytes Relative: 5
Neutrophils Relative %: 71

## 2011-05-05 LAB — CBC
Hemoglobin: 14.9
MCV: 95.3
RBC: 4.73
WBC: 7.5

## 2011-05-05 LAB — THERAPEUTIC PHLEBOTOMY

## 2011-05-06 LAB — CBC
HCT: 42.8
Hemoglobin: 13.9
RDW: 16.5 — ABNORMAL HIGH
WBC: 7.3

## 2011-05-06 LAB — DIFFERENTIAL
Eosinophils Relative: 1
Lymphocytes Relative: 25
Lymphs Abs: 1.8
Monocytes Absolute: 0.4

## 2011-05-09 LAB — CBC
HCT: 44.8
MCHC: 32.6
MCV: 96.9
Platelets: 445 — ABNORMAL HIGH
RDW: 14.2

## 2011-05-09 LAB — DIFFERENTIAL
Basophils Absolute: 0
Eosinophils Absolute: 0.1 — ABNORMAL LOW
Eosinophils Relative: 1

## 2011-05-13 LAB — DIFFERENTIAL
Basophils Absolute: 0.1
Eosinophils Absolute: 0.1
Lymphocytes Relative: 25
Lymphs Abs: 1.8
Neutrophils Relative %: 67

## 2011-05-13 LAB — CBC
Platelets: 405 — ABNORMAL HIGH
WBC: 7.2

## 2011-05-15 LAB — DIFFERENTIAL
Eosinophils Absolute: 0.1
Lymphocytes Relative: 26
Lymphs Abs: 1.7
Monocytes Relative: 6
Neutrophils Relative %: 67

## 2011-05-15 LAB — CBC
HCT: 44.5
MCV: 99.6
RBC: 4.47
WBC: 6.6

## 2011-05-19 LAB — DIFFERENTIAL
Basophils Relative: 0
Eosinophils Absolute: 0.1
Eosinophils Relative: 1
Lymphs Abs: 1.4
Monocytes Absolute: 0.4
Monocytes Relative: 5
Neutrophils Relative %: 76

## 2011-05-19 LAB — CBC
HCT: 45.1
Hemoglobin: 14.8
MCHC: 32.9
MCV: 100.4 — ABNORMAL HIGH
RBC: 4.49

## 2011-05-21 LAB — DIFFERENTIAL
Basophils Absolute: 0
Basophils Relative: 1
Eosinophils Absolute: 0.1
Neutro Abs: 6
Neutrophils Relative %: 76

## 2011-05-21 LAB — CBC
MCHC: 33.4
MCV: 99.8
Platelets: 485 — ABNORMAL HIGH
RDW: 15.4 — ABNORMAL HIGH

## 2011-05-22 ENCOUNTER — Encounter (HOSPITAL_COMMUNITY): Payer: Medicare Other | Attending: Oncology

## 2011-05-22 DIAGNOSIS — D45 Polycythemia vera: Secondary | ICD-10-CM

## 2011-05-22 LAB — DIFFERENTIAL
Eosinophils Relative: 1 % (ref 0–5)
Lymphocytes Relative: 21 % (ref 12–46)
Lymphs Abs: 1.5 10*3/uL (ref 0.7–4.0)
Monocytes Absolute: 0.5 10*3/uL (ref 0.1–1.0)

## 2011-05-22 LAB — CBC
Hemoglobin: 14.8 g/dL (ref 12.0–15.0)
Platelets: 410 10*3/uL — ABNORMAL HIGH (ref 150–400)
RBC: 4.73 MIL/uL (ref 3.87–5.11)

## 2011-05-22 NOTE — Progress Notes (Signed)
Labs drawn today for cbc,diff 

## 2011-05-26 ENCOUNTER — Other Ambulatory Visit (HOSPITAL_COMMUNITY): Payer: Self-pay | Admitting: Internal Medicine

## 2011-05-26 ENCOUNTER — Other Ambulatory Visit (HOSPITAL_COMMUNITY): Payer: Self-pay | Admitting: Family Medicine

## 2011-05-26 DIAGNOSIS — Z139 Encounter for screening, unspecified: Secondary | ICD-10-CM

## 2011-05-30 ENCOUNTER — Ambulatory Visit (HOSPITAL_COMMUNITY)
Admission: RE | Admit: 2011-05-30 | Discharge: 2011-05-30 | Disposition: A | Discharge status: Home / Self Care | Payer: Medicare Other | Source: Ambulatory Visit | Attending: Internal Medicine | Admitting: Internal Medicine

## 2011-05-30 DIAGNOSIS — M818 Other osteoporosis without current pathological fracture: Secondary | ICD-10-CM | POA: Insufficient documentation

## 2011-05-30 DIAGNOSIS — Z139 Encounter for screening, unspecified: Secondary | ICD-10-CM

## 2011-06-19 ENCOUNTER — Encounter (HOSPITAL_COMMUNITY): Payer: Medicare Other | Attending: Oncology

## 2011-06-19 DIAGNOSIS — D45 Polycythemia vera: Secondary | ICD-10-CM | POA: Insufficient documentation

## 2011-06-19 LAB — CBC
HCT: 44.7 % (ref 36.0–46.0)
MCH: 31.7 pg (ref 26.0–34.0)
MCHC: 32 g/dL (ref 30.0–36.0)
MCV: 99.1 fL (ref 78.0–100.0)
RDW: 17.4 % — ABNORMAL HIGH (ref 11.5–15.5)

## 2011-06-19 LAB — DIFFERENTIAL
Basophils Absolute: 0.1 10*3/uL (ref 0.0–0.1)
Basophils Relative: 1 % (ref 0–1)
Eosinophils Relative: 1 % (ref 0–5)
Monocytes Absolute: 0.3 10*3/uL (ref 0.1–1.0)

## 2011-06-19 NOTE — Progress Notes (Signed)
Labs drawn today for cbc/diff 

## 2011-06-20 ENCOUNTER — Encounter (HOSPITAL_COMMUNITY): Payer: Self-pay

## 2011-07-17 ENCOUNTER — Encounter (HOSPITAL_COMMUNITY): Payer: Medicare Other | Attending: Oncology

## 2011-07-17 DIAGNOSIS — D45 Polycythemia vera: Secondary | ICD-10-CM

## 2011-07-17 LAB — DIFFERENTIAL
Basophils Absolute: 0.1 10*3/uL (ref 0.0–0.1)
Basophils Relative: 1 % (ref 0–1)
Monocytes Absolute: 0.2 10*3/uL (ref 0.1–1.0)
Neutro Abs: 3.8 10*3/uL (ref 1.7–7.7)
Neutrophils Relative %: 68 % (ref 43–77)

## 2011-07-17 LAB — CBC
MCHC: 32.7 g/dL (ref 30.0–36.0)
RDW: 19.1 % — ABNORMAL HIGH (ref 11.5–15.5)

## 2011-07-17 NOTE — Progress Notes (Signed)
Charlotte [name] presented for labwork. Labs per MD order drawn via Peripheral Line 25 gauge needle inserted in lt ac  Good blood return present. Procedure without incident.  Needle removed intact. Patient tolerated procedure well.

## 2011-07-18 ENCOUNTER — Other Ambulatory Visit (HOSPITAL_COMMUNITY): Payer: Self-pay

## 2011-07-18 DIAGNOSIS — D45 Polycythemia vera: Secondary | ICD-10-CM

## 2011-08-12 DIAGNOSIS — I4892 Unspecified atrial flutter: Secondary | ICD-10-CM | POA: Diagnosis not present

## 2011-08-14 ENCOUNTER — Encounter (HOSPITAL_COMMUNITY): Payer: Medicare Other | Attending: Oncology

## 2011-08-14 DIAGNOSIS — D45 Polycythemia vera: Secondary | ICD-10-CM | POA: Diagnosis not present

## 2011-08-14 LAB — DIFFERENTIAL
Basophils Absolute: 0.1 10*3/uL (ref 0.0–0.1)
Basophils Relative: 1 % (ref 0–1)
Lymphocytes Relative: 30 % (ref 12–46)
Monocytes Absolute: 0.3 10*3/uL (ref 0.1–1.0)
Neutro Abs: 3.2 10*3/uL (ref 1.7–7.7)

## 2011-08-14 LAB — CBC
HCT: 43.8 % (ref 36.0–46.0)
MCHC: 32.6 g/dL (ref 30.0–36.0)
Platelets: 403 10*3/uL — ABNORMAL HIGH (ref 150–400)
RDW: 21.2 % — ABNORMAL HIGH (ref 11.5–15.5)
WBC: 5.1 10*3/uL (ref 4.0–10.5)

## 2011-08-14 NOTE — Progress Notes (Signed)
Labs drawn today for cbc/diff 

## 2011-09-09 DIAGNOSIS — I4891 Unspecified atrial fibrillation: Secondary | ICD-10-CM | POA: Diagnosis not present

## 2011-09-11 ENCOUNTER — Encounter (HOSPITAL_COMMUNITY): Payer: Medicare Other | Attending: Oncology

## 2011-09-11 DIAGNOSIS — D45 Polycythemia vera: Secondary | ICD-10-CM | POA: Insufficient documentation

## 2011-09-11 LAB — CBC
HCT: 42 % (ref 36.0–46.0)
Hemoglobin: 13.7 g/dL (ref 12.0–15.0)
MCHC: 32.6 g/dL (ref 30.0–36.0)
RDW: 20.8 % — ABNORMAL HIGH (ref 11.5–15.5)
WBC: 4.3 10*3/uL (ref 4.0–10.5)

## 2011-09-11 LAB — DIFFERENTIAL
Basophils Absolute: 0 10*3/uL (ref 0.0–0.1)
Basophils Relative: 1 % (ref 0–1)
Lymphocytes Relative: 32 % (ref 12–46)
Neutro Abs: 2.7 10*3/uL (ref 1.7–7.7)
Neutrophils Relative %: 62 % (ref 43–77)

## 2011-09-11 NOTE — Progress Notes (Signed)
Charlotte [name] presented for labwork. Labs per MD order drawn via Peripheral Line 25 gauge needle inserted in lt ac.  Good blood return present. Procedure without incident.  Needle removed intact. Patient tolerated procedure well.

## 2011-09-26 ENCOUNTER — Ambulatory Visit (HOSPITAL_COMMUNITY): Payer: Medicare Other | Admitting: Oncology

## 2011-09-29 ENCOUNTER — Encounter (HOSPITAL_BASED_OUTPATIENT_CLINIC_OR_DEPARTMENT_OTHER): Payer: Medicare Other | Admitting: Oncology

## 2011-09-29 ENCOUNTER — Encounter (HOSPITAL_COMMUNITY): Payer: Self-pay | Admitting: Oncology

## 2011-09-29 VITALS — BP 131/81 | HR 67 | Temp 97.4°F | Wt 199.0 lb

## 2011-09-29 DIAGNOSIS — I4891 Unspecified atrial fibrillation: Secondary | ICD-10-CM

## 2011-09-29 DIAGNOSIS — I10 Essential (primary) hypertension: Secondary | ICD-10-CM | POA: Diagnosis not present

## 2011-09-29 DIAGNOSIS — D45 Polycythemia vera: Secondary | ICD-10-CM | POA: Diagnosis not present

## 2011-09-29 NOTE — Patient Instructions (Signed)
Charlotte [name]  [id number] 07-02-1941   [facility]  Discharge Instructions  RECOMMENDATIONS MADE BY THE CONSULTANT AND ANY TEST RESULTS WILL BE SENT TO YOUR REFERRING DOCTOR.   EXAM FINDINGS BY MD TODAY AND SIGNS AND SYMPTOMS TO REPORT TO CLINIC OR PRIMARY MD: Your blood counts are good.  Will continue the same dosage of hydrea that you are taking.  2 pills each morning and 1 pill each evening.  MEDICATIONS PRESCRIBED: none   INSTRUCTIONS GIVEN AND DISCUSSED: Other :  Report fevers, infections, unusual bruising or bleeding.  SPECIAL INSTRUCTIONS/FOLLOW-UP: Lab work Needed every 4 weeks and Return to Clinic in 6 months to see Dr. Delphia Grates   I acknowledge that I have been informed and understand all the instructions given to me and received a copy. I do not have any more questions at this time, but understand that I may call the Specialty Clinic at [facility] at [phone] during business hours should I have any further questions or need assistance in obtaining follow-up care.    __________________________________________  _____________  __________ Signature of Patient or Authorized Representative            Date                   Time    __________________________________________ Nurse's Signature

## 2011-09-29 NOTE — Progress Notes (Signed)
This office note has been dictated.
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
## 2011-10-01 ENCOUNTER — Other Ambulatory Visit (HOSPITAL_COMMUNITY): Payer: Self-pay | Admitting: Oncology

## 2011-10-01 ENCOUNTER — Encounter (HOSPITAL_COMMUNITY): Payer: Self-pay | Admitting: Oncology

## 2011-10-01 DIAGNOSIS — D45 Polycythemia vera: Secondary | ICD-10-CM | POA: Insufficient documentation

## 2011-10-01 HISTORY — DX: Polycythemia vera: D45

## 2011-10-01 MED ORDER — HYDROXYUREA 500 MG PO CAPS: ORAL_CAPSULE | ORAL | Status: DC

## 2011-10-07 DIAGNOSIS — I4891 Unspecified atrial fibrillation: Secondary | ICD-10-CM | POA: Diagnosis not present

## 2011-10-09 ENCOUNTER — Encounter (HOSPITAL_COMMUNITY): Payer: Medicare Other | Attending: Oncology

## 2011-10-09 DIAGNOSIS — D45 Polycythemia vera: Secondary | ICD-10-CM | POA: Diagnosis not present

## 2011-10-09 LAB — CBC
MCH: 38.5 pg — ABNORMAL HIGH (ref 26.0–34.0)
MCHC: 33.7 g/dL (ref 30.0–36.0)
MCV: 114.2 fL — ABNORMAL HIGH (ref 78.0–100.0)
Platelets: 326 10*3/uL (ref 150–400)
RBC: 3.74 MIL/uL — ABNORMAL LOW (ref 3.87–5.11)

## 2011-10-09 LAB — DIFFERENTIAL
Basophils Relative: 1 % (ref 0–1)
Eosinophils Absolute: 0 10*3/uL (ref 0.0–0.7)
Eosinophils Relative: 0 % (ref 0–5)
Lymphs Abs: 1.3 10*3/uL (ref 0.7–4.0)
Monocytes Relative: 7 % (ref 3–12)

## 2011-10-09 NOTE — Progress Notes (Signed)
Lab draw

## 2011-10-10 ENCOUNTER — Encounter (HOSPITAL_COMMUNITY): Payer: Medicare Other

## 2011-10-14 DIAGNOSIS — B351 Tinea unguium: Secondary | ICD-10-CM | POA: Diagnosis not present

## 2011-10-21 DIAGNOSIS — I4892 Unspecified atrial flutter: Secondary | ICD-10-CM | POA: Diagnosis not present

## 2011-11-06 ENCOUNTER — Encounter (HOSPITAL_COMMUNITY): Payer: Medicare Other

## 2011-11-06 ENCOUNTER — Encounter (HOSPITAL_COMMUNITY): Payer: Medicare Other | Attending: Oncology

## 2011-11-06 DIAGNOSIS — D45 Polycythemia vera: Secondary | ICD-10-CM

## 2011-11-06 LAB — CBC
MCH: 40.9 pg — ABNORMAL HIGH (ref 26.0–34.0)
MCV: 118.5 fL — ABNORMAL HIGH (ref 78.0–100.0)
Platelets: 238 10*3/uL (ref 150–400)
RDW: 16.7 % — ABNORMAL HIGH (ref 11.5–15.5)
WBC: 4.7 10*3/uL (ref 4.0–10.5)

## 2011-11-06 LAB — DIFFERENTIAL
Basophils Absolute: 0 10*3/uL (ref 0.0–0.1)
Eosinophils Absolute: 0 10*3/uL (ref 0.0–0.7)
Eosinophils Relative: 0 % (ref 0–5)

## 2011-11-07 ENCOUNTER — Other Ambulatory Visit: Payer: Self-pay

## 2011-11-07 ENCOUNTER — Emergency Department (HOSPITAL_COMMUNITY)
Admission: EM | Admit: 2011-11-07 | Discharge: 2011-11-07 | Disposition: A | Discharge status: Home / Self Care | Payer: Medicare Other | Attending: Emergency Medicine | Admitting: Emergency Medicine

## 2011-11-07 ENCOUNTER — Encounter (HOSPITAL_COMMUNITY): Payer: Self-pay

## 2011-11-07 DIAGNOSIS — I10 Essential (primary) hypertension: Secondary | ICD-10-CM | POA: Insufficient documentation

## 2011-11-07 DIAGNOSIS — Z7901 Long term (current) use of anticoagulants: Secondary | ICD-10-CM | POA: Diagnosis not present

## 2011-11-07 DIAGNOSIS — R42 Dizziness and giddiness: Secondary | ICD-10-CM | POA: Insufficient documentation

## 2011-11-07 DIAGNOSIS — I499 Cardiac arrhythmia, unspecified: Secondary | ICD-10-CM | POA: Insufficient documentation

## 2011-11-07 LAB — CBC
Hemoglobin: 14.2 g/dL (ref 12.0–15.0)
MCHC: 34.7 g/dL (ref 30.0–36.0)

## 2011-11-07 LAB — DIFFERENTIAL
Basophils Absolute: 0 10*3/uL (ref 0.0–0.1)
Basophils Relative: 1 % (ref 0–1)
Eosinophils Absolute: 0 10*3/uL (ref 0.0–0.7)
Lymphocytes Relative: 32 % (ref 12–46)
Neutrophils Relative %: 62 % (ref 43–77)

## 2011-11-07 LAB — BASIC METABOLIC PANEL
CO2: 25 mEq/L (ref 19–32)
Calcium: 9.9 mg/dL (ref 8.4–10.5)
GFR calc Af Amer: >90 mL/min (ref >90)
GFR calc non Af Amer: 84 mL/min — ABNORMAL LOW (ref >90)
Sodium: 138 mEq/L (ref 135–145)

## 2011-11-07 LAB — PROTIME-INR
INR: 3.13 — ABNORMAL HIGH (ref 0.00–1.49)
Prothrombin Time: 32.7 seconds — ABNORMAL HIGH (ref 11.6–15.2)

## 2011-11-07 MED ORDER — MECLIZINE HCL 25 MG PO TABS: 25.0000 mg | ORAL_TABLET | Freq: Three times a day (TID) | ORAL | Status: AC | PRN

## 2011-11-07 MED ORDER — MECLIZINE HCL 12.5 MG PO TABS
25.0000 mg | ORAL_TABLET | Freq: Once | ORAL | Status: AC
  Administered 2011-11-07: 25 mg via ORAL
  Filled 2011-11-07: qty 2
  Filled 2011-11-07: qty 1

## 2011-11-07 MED ORDER — METOCLOPRAMIDE HCL 10 MG PO TABS: 10.0000 mg | ORAL_TABLET | Freq: Four times a day (QID) | ORAL | Status: DC | PRN

## 2011-11-07 MED ORDER — ONDANSETRON HCL 4 MG/2ML IJ SOLN
4.0000 mg | Freq: Once | INTRAMUSCULAR | Status: AC
  Administered 2011-11-07: 4 mg via INTRAVENOUS
  Filled 2011-11-07: qty 2

## 2011-11-07 NOTE — ED Notes (Signed)
Pt presents with hypertension (154/100), dizziness, and nausea that started today.

## 2011-11-07 NOTE — Progress Notes (Signed)
Lab draw

## 2011-11-07 NOTE — Discharge Instructions (Signed)
Continue monitoring her blood pressure at home. Keep a record of your blood pressures and take it with you when you see Dr. Hilma Favors. Return to the emergency department if symptoms worsen, otherwise, see Dr. Hilma Favors next week.  Vertigo Vertigo means you feel like you or your surroundings are moving when they are not. Vertigo can be dangerous if it occurs when you are at work, driving, or performing difficult activities.  CAUSES  Vertigo occurs when there is a conflict of signals sent to your brain from the visual and sensory systems in your body. There are many different causes of vertigo, including:  Infections, especially in the inner ear.   A bad reaction to a drug or misuse of alcohol and medicines.   Withdrawal from drugs or alcohol.   Rapidly changing positions, such as lying down or rolling over in bed.   A migraine headache.   Decreased blood flow to the brain.   Increased pressure in the brain from a head injury, infection, tumor, or bleeding.  SYMPTOMS  You may feel as though the world is spinning around or you are falling to the ground. Because your balance is upset, vertigo can cause nausea and vomiting. You may have involuntary eye movements (nystagmus). DIAGNOSIS  Vertigo is usually diagnosed by physical exam. If the cause of your vertigo is unknown, your caregiver may perform imaging tests, such as an MRI scan (magnetic resonance imaging). TREATMENT  Most cases of vertigo resolve on their own, without treatment. Depending on the cause, your caregiver may prescribe certain medicines. If your vertigo is related to body position issues, your caregiver may recommend movements or procedures to correct the problem. In rare cases, if your vertigo is caused by certain inner ear problems, you may need surgery. HOME CARE INSTRUCTIONS   Follow your caregiver's instructions.   Avoid driving.   Avoid operating heavy machinery.   Avoid performing any tasks that would be dangerous to  you or others during a vertigo episode.   Tell your caregiver if you notice that certain medicines seem to be causing your vertigo. Some of the medicines used to treat vertigo episodes can actually make them worse in some people.  SEEK IMMEDIATE MEDICAL CARE IF:   Your medicines do not relieve your vertigo or are making it worse.   You develop problems with talking, walking, weakness, or using your arms, hands, or legs.   You develop severe headaches.   Your nausea or vomiting continues or gets worse.   You develop visual changes.   A family member notices behavioral changes.   Your condition gets worse.  MAKE SURE YOU:  Understand these instructions.   Will watch your condition.   Will get help right away if you are not doing well or get worse.  Document Released: 04/30/2005 Document Revised: 07/10/2011 Document Reviewed: 02/06/2011 [facility] Patient Information 2012 [employer].  Meclizine tablets or capsules What is this medicine? MECLIZINE (MEK li zeen) is an antihistamine. It is used to prevent nausea, vomiting, or dizziness caused by motion sickness. It is also used to prevent and treat vertigo (extreme dizziness or a feeling that you or your surroundings are tilting or spinning around). This medicine may be used for other purposes; ask your health care provider or pharmacist if you have questions. What should I tell my health care provider before I take this medicine? They need to know if you have any of these conditions: -asthma -glaucoma -prostate trouble -stomach problems -urinary problems -an unusual or  allergic reaction to meclizine, other medicines, foods, dyes, or preservatives -pregnant or trying to get pregnant -breast-feeding How should I use this medicine? Take this medicine by mouth with a glass of water. Follow the directions on the prescription label. If you are using this medicine to prevent motion sickness, take the dose at least 1 hour before  travel. If it upsets your stomach, take it with food or milk. Take your doses at regular intervals. Do not take your medicine more often than directed. Talk to your pediatrician regarding the use of this medicine in children. Special care may be needed. Overdosage: If you think you have taken too much of this medicine contact a poison control center or emergency room at once. NOTE: This medicine is only for you. Do not share this medicine with others. What if I miss a dose? If you miss a dose, take it as soon as you can. If it is almost time for your next dose, take only that dose. Do not take double or extra doses. What may interact with this medicine? -barbiturate medicines for inducing sleep or treating seizures -digoxin -medicines for anxiety or sleeping problems, like alprazolam, diazepam or temazepam -medicines for hay fever and other allergies -medicines for mental depression -medicines for movement abnormalities as in Parkinson's disease, or for stomach problems -medicines for pain -medicines that relax muscles This list may not describe all possible interactions. Give your health care provider a list of all the medicines, herbs, non-prescription drugs, or dietary supplements you use. Also tell them if you smoke, drink alcohol, or use illegal drugs. Some items may interact with your medicine. What should I watch for while using this medicine? If you are taking this medicine on a regular schedule, visit your doctor or health care professional for regular checks on your progress. You may get dizzy, drowsy or have blurred vision. Do not drive, use machinery, or do anything that needs mental alertness until you know how this medicine affects you. Do not stand or sit up quickly, especially if you are an older patient. This reduces the risk of dizzy or fainting spells. Alcohol can increase possible dizziness. Avoid alcoholic drinks. Your mouth may get dry. Chewing sugarless gum or sucking hard  candy, and drinking plenty of water may help. Contact your doctor if the problem does not go away or is severe. This medicine may cause dry eyes and blurred vision. If you wear contact lenses you may feel some discomfort. Lubricating drops may help. See your eye doctor if the problem does not go away or is severe. What side effects may I notice from receiving this medicine? Side effects that you should report to your doctor or health care professional as soon as possible: -fainting spells -fast or irregular heartbeat Side effects that usually do not require medical attention (report to your doctor or health care professional if they continue or are bothersome): -constipation -difficulty passing urine -difficulty sleeping -headache -stomach upset This list may not describe all possible side effects. Call your doctor for medical advice about side effects. You may report side effects to FDA at 1-800-FDA-1088. Where should I keep my medicine? Keep out of the reach of children. Store at room temperature between 15 and 30 degrees C (59 and 86 degrees F). Keep container tightly closed. Throw away any unused medicine after the expiration date. NOTE: This sheet is a summary. It may not cover all possible information. If you have questions about this medicine, talk to your doctor, pharmacist, or  health care provider.  2012, Elsevier/Gold Standard. (01/27/2008 10:35:36 AM)  Metoclopramide tablets What is this medicine? METOCLOPRAMIDE (met oh kloe PRA mide) is used to treat the symptoms of gastroesophageal reflux disease (GERD) like heartburn. It is also used to treat people with slow emptying of the stomach and intestinal tract. This medicine may be used for other purposes; ask your health care provider or pharmacist if you have questions. What should I tell my health care provider before I take this medicine? They need to know if you have any of these conditions: -breast  cancer -depression -diabetes -heart failure -high blood pressure -kidney disease -liver disease -Parkinson's disease or a movement disorder -pheochromocytoma -seizures -stomach obstruction, bleeding, or perforation -an unusual or allergic reaction to metoclopramide, procainamide, sulfites, other medicines, foods, dyes, or preservatives -pregnant or trying to get pregnant -breast-feeding How should I use this medicine? Take this medicine by mouth with a glass of water. Follow the directions on the prescription label. Take this medicine on an empty stomach, about 30 minutes before eating. Take your doses at regular intervals. Do not take your medicine more often than directed. Do not stop taking except on the advice of your doctor or health care professional. A special MedGuide will be given to you by the pharmacist with each prescription and refill. Be sure to read this information carefully each time. Talk to your pediatrician regarding the use of this medicine in children. Special care may be needed. Overdosage: If you think you have taken too much of this medicine contact a poison control center or emergency room at once. NOTE: This medicine is only for you. Do not share this medicine with others. What if I miss a dose? If you miss a dose, take it as soon as you can. If it is almost time for your next dose, take only that dose. Do not take double or extra doses. What may interact with this medicine? -acetaminophen -cyclosporine -digoxin -medicines for blood pressure -medicines for diabetes, including insulin -medicines for hay fever and other allergies -medicines for depression, especially an Monoamine Oxidase Inhibitor (MAOI) -medicines for Parkinson's disease, like levodopa -medicines for sleep or for pain -tetracycline This list may not describe all possible interactions. Give your health care provider a list of all the medicines, herbs, non-prescription drugs, or dietary  supplements you use. Also tell them if you smoke, drink alcohol, or use illegal drugs. Some items may interact with your medicine. What should I watch for while using this medicine? It may take a few weeks for your stomach condition to start to get better. However, do not take this medicine for longer than 12 weeks. The longer you take this medicine, and the more you take it, the greater your chances are of developing serious side effects. If you are an elderly patient, a female patient, or you have diabetes, you may be at an increased risk for side effects from this medicine. Contact your doctor immediately if you start having movements you cannot control such as lip smacking, rapid movements of the tongue, involuntary or uncontrollable movements of the eyes, head, arms and legs, or muscle twitches and spasms. Patients and their families should watch out for worsening depression or thoughts of suicide. Also watch out for any sudden or severe changes in feelings such as feeling anxious, agitated, panicky, irritable, hostile, aggressive, impulsive, severely restless, overly excited and hyperactive, or not being able to sleep. If this happens, especially at the beginning of treatment or after a change in  dose, call your doctor. Do not treat yourself for high fever. Ask your doctor or health care professional for advice. You may get drowsy or dizzy. Do not drive, use machinery, or do anything that needs mental alertness until you know how this drug affects you. Do not stand or sit up quickly, especially if you are an older patient. This reduces the risk of dizzy or fainting spells. Alcohol can make you more drowsy and dizzy. Avoid alcoholic drinks. What side effects may I notice from receiving this medicine? Side effects that you should report to your doctor or health care professional as soon as possible: -allergic reactions like skin rash, itching or hives, swelling of the face, lips, or tongue -abnormal  production of milk in females -breast enlargement in both males and females -change in the way you walk -difficulty moving, speaking or swallowing -drooling, lip smacking, or rapid movements of the tongue -excessive sweating -fever -involuntary or uncontrollable movements of the eyes, head, arms and legs -irregular heartbeat or palpitations -muscle twitches and spasms -unusually weak or tired Side effects that usually do not require medical attention (report to your doctor or health care professional if they continue or are bothersome): -change in sex drive or performance -depressed mood -diarrhea -difficulty sleeping -headache -menstrual changes -restless or nervous This list may not describe all possible side effects. Call your doctor for medical advice about side effects. You may report side effects to FDA at 1-800-FDA-1088. Where should I keep my medicine? Keep out of the reach of children. Store at room temperature between 20 and 25 degrees C (68 and 77 degrees F). Protect from light. Keep container tightly closed. Throw away any unused medicine after the expiration date. NOTE: This sheet is a summary. It may not cover all possible information. If you have questions about this medicine, talk to your doctor, pharmacist, or health care provider.  2012, [employer]vier/Gold Standard. (03/15/2008 4:30:05 PM)

## 2011-11-07 NOTE — ED Notes (Signed)
Pt complain of nausea and vomiting. Vomiting yellow liquid

## 2011-11-07 NOTE — ED Provider Notes (Signed)
History   This chart was scribed for Delora Fuel, MD by Marin Comment . The patient was seen in room APA01/APA01 and the patient's care was started at 3:12pm.   CSN: [id number]  Arrival date & time 11/07/11  1444   First MD Initiated Contact with Patient 11/07/11 1457      Chief Complaint  Patient presents with  . Dizziness  . Nausea  . Hypertension    (Consider location/radiation/quality/duration/timing/severity/associated sxs/prior treatment) HPI Charlotte [name] is a 71 y.o. female who has a h/o HTN and irregular heart rate, presents to the Emergency Department complaining of constant, moderate dizziness that started this morning. Patient describes the onset as sudden. Patient reports associated nausea and HTN, but denies vomiting. Patient denies any ear pain or ear ringing. Patient denies any chest pain. Patient reports no associated pain. Patient states that the dizziness is made better by laying down or being still. Patient states that the dizziness is aggravated by movement and bending over. Patient denies taking any medications for her symptoms. Patient denies smoking or alcohol use. Patient notes that she has a h/o inner ear problems in the past, but states that today's symptoms are not similar. Patient states that her BP was 154/100 at home. BP here in ED is 180/106.   PCP: Dr. Hilma Favors   Past Medical History  Diagnosis Date  . Hypertension   . Arthritis   . Varicose veins   . Deaf   . Elevated WBC count     and platelets  . Thyroid disease   . Irregular heart rate   . Polycythemia vera   . Peripheral neuropathy     feet  . Polycythemia vera 10/01/2011    Past Surgical History  Procedure Date  . Blt   . Abdominal hysterectomy   . Coronary angioplasty   . Ct of abd     No family history on file.  History  Substance Use Topics  . Smoking status: Never Smoker   . Smokeless tobacco: Not on file  . Alcohol Use: No    OB History    Grav Para Term  Preterm Abortions TAB SAB Ect Mult Living                  Review of Systems A complete 10 system review of systems was obtained and all systems are negative except as noted in the HPI and PMH.   Allergies  Review of patient's allergies indicates no known allergies.  Home Medications   Current Outpatient Rx  Name Route Sig Dispense Refill  . ASPIRIN 81 MG PO TABS Oral Take 81 mg by mouth daily.      Marland Kitchen DILTIAZEM HCL ER BEADS 180 MG PO CP24 Oral Take 180 mg by mouth daily.    Marland Kitchen GABAPENTIN 100 MG PO CAPS Oral Take 100 mg by mouth 3 (three) times daily.      Marland Kitchen HYDROXYUREA 500 MG PO CAPS  Taking 2 pills each morning and 1 pill each evening. 90 capsule 3  . LEVOTHYROXINE SODIUM 25 MCG PO TABS Oral Take 25 mcg by mouth daily.      Marland Kitchen METOPROLOL SUCCINATE ER 100 MG PO TB24 Oral Take 100 mg by mouth 2 (two) times daily.      Marland Kitchen FISH OIL 1200 MG PO CAPS Oral Take 1 capsule by mouth 3 (three) times daily.     . WARFARIN SODIUM 5 MG PO TABS Oral Take 5 mg by mouth daily.  BP 164/93  Pulse 85  Temp(Src) 97.9 F (36.6 C) (Oral)  Resp 15  Ht 6' (1.829 m)  Wt 197 lb (89.359 kg)  BMI 26.72 kg/m2  SpO2 95%  Physical Exam  Nursing note and vitals reviewed. Constitutional: She is oriented to person, place, and time. She appears well-developed and well-nourished. No distress.       Dizziness reproducible by head movements.   HENT:  Head: Normocephalic and atraumatic.       TM's normal bilaterally.   Eyes: EOM are normal. Pupils are equal, round, and reactive to light. Right eye exhibits no nystagmus. Left eye exhibits no nystagmus.  Neck: Normal range of motion. Neck supple. No tracheal deviation present.  Cardiovascular: Normal rate and normal heart sounds.  An irregular rhythm present.        No carotid bruits.  Pulmonary/Chest: Effort normal and breath sounds normal. No respiratory distress. She has no wheezes. She has no rales.  Abdominal: Soft. Bowel sounds are normal. She exhibits  no distension. There is no tenderness.  Musculoskeletal: Normal range of motion. She exhibits no edema.  Neurological: She is alert and oriented to person, place, and time. No sensory deficit.  Skin: Skin is warm and dry.  Psychiatric: She has a normal mood and affect. Her behavior is normal.    ED Course  Procedures (including critical care time)  DIAGNOSTIC STUDIES: Oxygen Saturation is 100% on room air, normal by my interpretation.    COORDINATION OF CARE:  1520: Discussed planned course of treatment with the patient who is agreeable at this time.  1530: Medication Orders: Meclizine tablet 25 mg-once 1700: Medication Orders: Ondansetron injection 4 mg-once  Results for orders placed during the hospital encounter of 11/07/11  CBC      Component Value Range   WBC 4.2  4.0 - 10.5 (K/uL)   RBC 3.46 (*) 3.87 - 5.11 (MIL/uL)   Hemoglobin 14.2  12.0 - 15.0 (g/dL)   HCT 40.9  36.0 - 46.0 (%)   MCV 118.2 (*) 78.0 - 100.0 (fL)   MCH 41.0 (*) 26.0 - 34.0 (pg)   MCHC 34.7  30.0 - 36.0 (g/dL)   RDW 16.5 (*) 11.5 - 15.5 (%)   Platelets 239  150 - 400 (K/uL)  DIFFERENTIAL      Component Value Range   Neutrophils Relative 62  43 - 77 (%)   Lymphocytes Relative 32  12 - 46 (%)   Monocytes Relative 4  3 - 12 (%)   Eosinophils Relative 1  0 - 5 (%)   Basophils Relative 1  0 - 1 (%)   Neutro Abs 2.7  1.7 - 7.7 (K/uL)   Lymphs Abs 1.3  0.7 - 4.0 (K/uL)   Monocytes Absolute 0.2  0.1 - 1.0 (K/uL)   Eosinophils Absolute 0.0  0.0 - 0.7 (K/uL)   Basophils Absolute 0.0  0.0 - 0.1 (K/uL)   RBC Morphology STOMATOCYTES     Smear Review LARGE PLATELETS PRESENT    BASIC METABOLIC PANEL      Component Value Range   Sodium 138  135 - 145 (mEq/L)   Potassium 4.1  3.5 - 5.1 (mEq/L)   Chloride 102  96 - 112 (mEq/L)   CO2 25  19 - 32 (mEq/L)   Glucose, Bld 98  70 - 99 (mg/dL)   BUN 19  6 - 23 (mg/dL)   Creatinine, Ser 0.75  0.50 - 1.10 (mg/dL)   Calcium 9.9  8.4 - 10.5 (  mg/dL)   GFR calc non Af  Amer 84 (*) >90 (mL/min)   GFR calc Af Amer >90  >90 (mL/min)  PROTIME-INR      Component Value Range   Prothrombin Time 32.7 (*) 11.6 - 15.2 (seconds)   INR 3.13 (*) 0.00 - 1.49     Date: 11/07/2011  Rate: 79  Rhythm: atrial fibrillation  QRS Axis: normal  Intervals: normal  ST/T Wave abnormalities: nonspecific ST changes  Conduction Disutrbances:none  Narrative Interpretation: Atrial fibrillation with nonspecific ST changes. When compared with ECG of 07/06/2009, no significant changes are seen.  Old EKG Reviewed: unchanged  1700: She feels much better after her oral meclizine. She'll be discharged with a prescription for metoclopramide and meclizine. INR is minimally supratherapeutic. No adjustments will be made on warfarin dose.  1. Vertigo   2. Hypertension       MDM  Dizziness which seems most likely to be labyrinthine in origin. She does not show any of the signs of central vertigo. She will get treated with meclizine and he reevaluated.   I personally performed the services described in this documentation, which was scribed in my presence. The recorded information has been reviewed and considered.         Delora Fuel, MD [id number]

## 2011-11-10 ENCOUNTER — Other Ambulatory Visit (HOSPITAL_COMMUNITY): Payer: Medicare Other

## 2011-11-11 DIAGNOSIS — I4891 Unspecified atrial fibrillation: Secondary | ICD-10-CM | POA: Diagnosis not present

## 2011-11-15 ENCOUNTER — Emergency Department (HOSPITAL_COMMUNITY): Payer: Medicare Other

## 2011-11-15 ENCOUNTER — Inpatient Hospital Stay (HOSPITAL_COMMUNITY)
Admission: EM | Admit: 2011-11-15 | Discharge: 2011-11-21 | DRG: 482 | Disposition: A | Discharge status: Home Health | Payer: Medicare Other | Attending: Internal Medicine | Admitting: Internal Medicine

## 2011-11-15 ENCOUNTER — Encounter (HOSPITAL_COMMUNITY): Payer: Self-pay | Admitting: *Deleted

## 2011-11-15 DIAGNOSIS — I10 Essential (primary) hypertension: Secondary | ICD-10-CM | POA: Diagnosis not present

## 2011-11-15 DIAGNOSIS — S61409A Unspecified open wound of unspecified hand, initial encounter: Secondary | ICD-10-CM | POA: Diagnosis not present

## 2011-11-15 DIAGNOSIS — S62609A Fracture of unspecified phalanx of unspecified finger, initial encounter for closed fracture: Secondary | ICD-10-CM

## 2011-11-15 DIAGNOSIS — S60219A Contusion of unspecified wrist, initial encounter: Secondary | ICD-10-CM | POA: Diagnosis not present

## 2011-11-15 DIAGNOSIS — I517 Cardiomegaly: Secondary | ICD-10-CM | POA: Diagnosis not present

## 2011-11-15 DIAGNOSIS — W19XXXA Unspecified fall, initial encounter: Secondary | ICD-10-CM

## 2011-11-15 DIAGNOSIS — I4891 Unspecified atrial fibrillation: Secondary | ICD-10-CM | POA: Diagnosis not present

## 2011-11-15 DIAGNOSIS — M25539 Pain in unspecified wrist: Secondary | ICD-10-CM | POA: Diagnosis not present

## 2011-11-15 DIAGNOSIS — R791 Abnormal coagulation profile: Secondary | ICD-10-CM | POA: Diagnosis present

## 2011-11-15 DIAGNOSIS — Z4789 Encounter for other orthopedic aftercare: Secondary | ICD-10-CM | POA: Diagnosis not present

## 2011-11-15 DIAGNOSIS — R42 Dizziness and giddiness: Secondary | ICD-10-CM | POA: Diagnosis present

## 2011-11-15 DIAGNOSIS — Y92009 Unspecified place in unspecified non-institutional (private) residence as the place of occurrence of the external cause: Secondary | ICD-10-CM

## 2011-11-15 DIAGNOSIS — T45515A Adverse effect of anticoagulants, initial encounter: Secondary | ICD-10-CM | POA: Diagnosis present

## 2011-11-15 DIAGNOSIS — S72009A Fracture of unspecified part of neck of unspecified femur, initial encounter for closed fracture: Secondary | ICD-10-CM | POA: Diagnosis not present

## 2011-11-15 DIAGNOSIS — M7989 Other specified soft tissue disorders: Secondary | ICD-10-CM | POA: Diagnosis not present

## 2011-11-15 DIAGNOSIS — E039 Hypothyroidism, unspecified: Secondary | ICD-10-CM | POA: Diagnosis present

## 2011-11-15 DIAGNOSIS — G609 Hereditary and idiopathic neuropathy, unspecified: Secondary | ICD-10-CM | POA: Diagnosis present

## 2011-11-15 DIAGNOSIS — IMO0002 Reserved for concepts with insufficient information to code with codable children: Secondary | ICD-10-CM | POA: Diagnosis not present

## 2011-11-15 DIAGNOSIS — I482 Chronic atrial fibrillation, unspecified: Secondary | ICD-10-CM | POA: Diagnosis present

## 2011-11-15 DIAGNOSIS — H9190 Unspecified hearing loss, unspecified ear: Secondary | ICD-10-CM | POA: Diagnosis present

## 2011-11-15 DIAGNOSIS — Z7901 Long term (current) use of anticoagulants: Secondary | ICD-10-CM | POA: Diagnosis not present

## 2011-11-15 DIAGNOSIS — D45 Polycythemia vera: Secondary | ICD-10-CM | POA: Diagnosis not present

## 2011-11-15 DIAGNOSIS — M79609 Pain in unspecified limb: Secondary | ICD-10-CM | POA: Diagnosis not present

## 2011-11-15 DIAGNOSIS — Z79899 Other long term (current) drug therapy: Secondary | ICD-10-CM

## 2011-11-15 DIAGNOSIS — I8390 Asymptomatic varicose veins of unspecified lower extremity: Secondary | ICD-10-CM | POA: Diagnosis present

## 2011-11-15 DIAGNOSIS — M25559 Pain in unspecified hip: Secondary | ICD-10-CM | POA: Diagnosis not present

## 2011-11-15 DIAGNOSIS — Y999 Unspecified external cause status: Secondary | ICD-10-CM

## 2011-11-15 DIAGNOSIS — D6832 Hemorrhagic disorder due to extrinsic circulating anticoagulants: Secondary | ICD-10-CM

## 2011-11-15 DIAGNOSIS — Z7982 Long term (current) use of aspirin: Secondary | ICD-10-CM

## 2011-11-15 DIAGNOSIS — W108XXA Fall (on) (from) other stairs and steps, initial encounter: Secondary | ICD-10-CM | POA: Diagnosis present

## 2011-11-15 DIAGNOSIS — S72001A Fracture of unspecified part of neck of right femur, initial encounter for closed fracture: Secondary | ICD-10-CM | POA: Diagnosis present

## 2011-11-15 HISTORY — DX: Chronic atrial fibrillation, unspecified: I48.20

## 2011-11-15 LAB — COMPREHENSIVE METABOLIC PANEL
ALT: 14 U/L (ref 0–35)
Alkaline Phosphatase: 84 U/L (ref 39–117)
CO2: 24 mEq/L (ref 19–32)
GFR calc Af Amer: >90 mL/min (ref >90)
GFR calc non Af Amer: 83 mL/min — ABNORMAL LOW (ref >90)
Glucose, Bld: 95 mg/dL (ref 70–99)
Potassium: 3.6 mEq/L (ref 3.5–5.1)
Sodium: 138 mEq/L (ref 135–145)

## 2011-11-15 LAB — URINALYSIS, ROUTINE W REFLEX MICROSCOPIC
Bilirubin Urine: NEGATIVE
Leukocytes, UA: NEGATIVE
Nitrite: NEGATIVE
Protein, ur: NEGATIVE mg/dL

## 2011-11-15 LAB — DIFFERENTIAL
Basophils Absolute: 0 10*3/uL (ref 0.0–0.1)
Basophils Relative: 0 % (ref 0–1)
Eosinophils Absolute: 0 10*3/uL (ref 0.0–0.7)
Neutro Abs: 3.2 10*3/uL (ref 1.7–7.7)
Neutrophils Relative %: 77 % (ref 43–77)

## 2011-11-15 LAB — CBC
MCHC: 34.5 g/dL (ref 30.0–36.0)
Platelets: 303 10*3/uL (ref 150–400)
RDW: 16.2 % — ABNORMAL HIGH (ref 11.5–15.5)

## 2011-11-15 LAB — SAMPLE TO BLOOD BANK

## 2011-11-15 MED ORDER — ASPIRIN EC 81 MG PO TBEC
81.0000 mg | DELAYED_RELEASE_TABLET | Freq: Every day | ORAL | Status: DC
  Administered 2011-11-15 – 2011-11-21 (×6): 81 mg via ORAL
  Filled 2011-11-15 (×9): qty 1

## 2011-11-15 MED ORDER — METOPROLOL TARTRATE 1 MG/ML IV SOLN
5.0000 mg | Freq: Four times a day (QID) | INTRAVENOUS | Status: DC | PRN
  Administered 2011-11-20: 5 mg via INTRAVENOUS
  Filled 2011-11-15: qty 5

## 2011-11-15 MED ORDER — DILTIAZEM HCL ER BEADS 180 MG PO CP24
180.0000 mg | ORAL_CAPSULE | Freq: Every day | ORAL | Status: DC
  Filled 2011-11-15 (×3): qty 1

## 2011-11-15 MED ORDER — GABAPENTIN 100 MG PO CAPS
100.0000 mg | ORAL_CAPSULE | Freq: Three times a day (TID) | ORAL | Status: DC
  Administered 2011-11-15 – 2011-11-21 (×16): 100 mg via ORAL
  Filled 2011-11-15 (×21): qty 1

## 2011-11-15 MED ORDER — ALBUTEROL SULFATE (5 MG/ML) 0.5% IN NEBU: 2.5000 mg | INHALATION_SOLUTION | RESPIRATORY_TRACT | Status: DC | PRN

## 2011-11-15 MED ORDER — LEVOTHYROXINE SODIUM 25 MCG PO TABS
25.0000 ug | ORAL_TABLET | Freq: Every day | ORAL | Status: DC
  Filled 2011-11-15 (×3): qty 1

## 2011-11-15 MED ORDER — METOPROLOL SUCCINATE ER 100 MG PO TB24
100.0000 mg | ORAL_TABLET | Freq: Two times a day (BID) | ORAL | Status: DC
  Filled 2011-11-15 (×4): qty 1

## 2011-11-15 MED ORDER — LEVOTHYROXINE SODIUM 25 MCG PO TABS
25.0000 ug | ORAL_TABLET | Freq: Every day | ORAL | Status: DC
  Administered 2011-11-15 – 2011-11-21 (×6): 25 ug via ORAL
  Filled 2011-11-15 (×9): qty 1

## 2011-11-15 MED ORDER — ONDANSETRON HCL 4 MG/2ML IJ SOLN
4.0000 mg | Freq: Four times a day (QID) | INTRAMUSCULAR | Status: DC | PRN
  Administered 2011-11-16: 4 mg via INTRAVENOUS
  Filled 2011-11-15: qty 2

## 2011-11-15 MED ORDER — ACETAMINOPHEN 650 MG RE SUPP: 650.0000 mg | Freq: Four times a day (QID) | RECTAL | Status: DC | PRN

## 2011-11-15 MED ORDER — ONDANSETRON HCL 4 MG/2ML IJ SOLN
4.0000 mg | Freq: Once | INTRAMUSCULAR | Status: AC
  Administered 2011-11-15: 4 mg via INTRAVENOUS
  Filled 2011-11-15: qty 2

## 2011-11-15 MED ORDER — SODIUM CHLORIDE 0.9 % IV SOLN
INTRAVENOUS | Status: DC
  Administered 2011-11-15: 17:00:00 via INTRAVENOUS

## 2011-11-15 MED ORDER — MORPHINE SULFATE 4 MG/ML IJ SOLN
4.0000 mg | Freq: Once | INTRAMUSCULAR | Status: AC
  Administered 2011-11-15: 4 mg via INTRAVENOUS
  Filled 2011-11-15: qty 1

## 2011-11-15 MED ORDER — DOCUSATE SODIUM 100 MG PO CAPS
100.0000 mg | ORAL_CAPSULE | Freq: Two times a day (BID) | ORAL | Status: DC
  Administered 2011-11-15 – 2011-11-21 (×11): 100 mg via ORAL
  Filled 2011-11-15 (×16): qty 1

## 2011-11-15 MED ORDER — METOPROLOL SUCCINATE ER 50 MG PO TB24
100.0000 mg | ORAL_TABLET | Freq: Two times a day (BID) | ORAL | Status: DC
  Administered 2011-11-15 – 2011-11-21 (×11): 100 mg via ORAL
  Filled 2011-11-15 (×3): qty 2
  Filled 2011-11-15: qty 1
  Filled 2011-11-15: qty 2
  Filled 2011-11-15: qty 1
  Filled 2011-11-15: qty 2
  Filled 2011-11-15: qty 1
  Filled 2011-11-15 (×4): qty 2
  Filled 2011-11-15: qty 1
  Filled 2011-11-15: qty 2

## 2011-11-15 MED ORDER — ACETAMINOPHEN 325 MG PO TABS: 650.0000 mg | ORAL_TABLET | Freq: Four times a day (QID) | ORAL | Status: DC | PRN

## 2011-11-15 MED ORDER — ONDANSETRON HCL 4 MG PO TABS: 4.0000 mg | ORAL_TABLET | Freq: Four times a day (QID) | ORAL | Status: DC | PRN

## 2011-11-15 MED ORDER — METOCLOPRAMIDE HCL 10 MG PO TABS: 10.0000 mg | ORAL_TABLET | Freq: Four times a day (QID) | ORAL | Status: DC | PRN

## 2011-11-15 MED ORDER — DILTIAZEM HCL ER BEADS 180 MG PO CP24
180.0000 mg | ORAL_CAPSULE | Freq: Every day | ORAL | Status: DC
  Administered 2011-11-15: 180 mg via ORAL
  Filled 2011-11-15 (×3): qty 1

## 2011-11-15 MED ORDER — OXYCODONE HCL 5 MG PO TABS
5.0000 mg | ORAL_TABLET | ORAL | Status: DC | PRN
  Administered 2011-11-16: 5 mg via ORAL
  Filled 2011-11-15: qty 1

## 2011-11-15 MED ORDER — MECLIZINE HCL 12.5 MG PO TABS: 25.0000 mg | ORAL_TABLET | Freq: Three times a day (TID) | ORAL | Status: DC | PRN

## 2011-11-15 MED ORDER — GABAPENTIN 100 MG PO CAPS
100.0000 mg | ORAL_CAPSULE | Freq: Three times a day (TID) | ORAL | Status: DC
  Filled 2011-11-15 (×5): qty 1

## 2011-11-15 MED ORDER — HYDROMORPHONE HCL PF 1 MG/ML IJ SOLN: 1.0000 mg | INTRAMUSCULAR | Status: DC | PRN

## 2011-11-15 MED ORDER — DEXTROSE-NACL 5-0.45 % IV SOLN
INTRAVENOUS | Status: DC
  Administered 2011-11-15: via INTRAVENOUS

## 2011-11-15 MED ORDER — SODIUM CHLORIDE 0.9 % IJ SOLN
3.0000 mL | Freq: Two times a day (BID) | INTRAMUSCULAR | Status: DC
  Administered 2011-11-16 (×2): 3 mL via INTRAVENOUS
  Filled 2011-11-15 (×2): qty 3

## 2011-11-15 MED ORDER — HYDROXYUREA 500 MG PO CAPS
500.0000 mg | ORAL_CAPSULE | Freq: Two times a day (BID) | ORAL | Status: DC
  Administered 2011-11-16: 500 mg via ORAL
  Filled 2011-11-15 (×3): qty 2
  Filled 2011-11-15: qty 1
  Filled 2011-11-15: qty 2

## 2011-11-15 NOTE — ED Provider Notes (Cosign Needed)
History     CSN: [id number]  Arrival date & time 11/15/11  1428   First MD Initiated Contact with Patient 11/15/11 1541      Chief Complaint  Patient presents with  . Fall    (Consider location/radiation/quality/duration/timing/severity/associated sxs/prior treatment) HPI  She relates about 1 PM she was stepping out of her back door onto the porch and she misjudged the depth of the step and fell. She states she reached out with her left hand to grab the railing and has a bruise and pain in her left wrist and her left little finger. She also hit her right elbow. She states she landed on her right hip pain since she fell she's been unable to walk even with a cane. She denies hitting her head or having any loss of consciousness.  PCP Dr. Hilma Favors Cardiologist Dr. Lonny Prude heart and vascular Orthopedist none  Past Medical History  Diagnosis Date  . Hypertension   . Arthritis   . Varicose veins   . Deaf   . Elevated WBC count     and platelets  . Thyroid disease   . Irregular heart rate   . Polycythemia vera   . Peripheral neuropathy     feet  . Polycythemia vera 10/01/2011  . Atrial fibrillation, chronic     Past Surgical History  Procedure Date  . Blt   . Abdominal hysterectomy   . Coronary angioplasty   . Ct of abd     Family History  Problem Relation Age of Onset  . Diabetes Son     History  Substance Use Topics  . Smoking status: Never Smoker   . Smokeless tobacco: Not on file  . Alcohol Use: No  lives with husband Hard of hearing  OB History    Grav Para Term Preterm Abortions TAB SAB Ect Mult Living                  Review of Systems  All other systems reviewed and are negative.    Allergies  Review of patient's allergies indicates no known allergies.  Home Medications   Current Outpatient Rx  Name Route Sig Dispense Refill  . ASPIRIN EC 81 MG PO TBEC Oral Take 81 mg by mouth daily.    Marland Kitchen DILTIAZEM HCL ER BEADS 180 MG PO CP24 Oral  Take 180 mg by mouth daily.    Marland Kitchen GABAPENTIN 100 MG PO CAPS Oral Take 100 mg by mouth 3 (three) times daily.      Marland Kitchen HYDROXYUREA 500 MG PO CAPS  Taking 2 pills each morning and 1 pill each evening. 90 capsule 3  . LEVOTHYROXINE SODIUM 25 MCG PO TABS Oral Take 25 mcg by mouth daily.      Marland Kitchen MECLIZINE HCL 25 MG PO TABS Oral Take 1 tablet (25 mg total) by mouth 3 (three) times daily as needed. 30 tablet 0  . METOCLOPRAMIDE HCL 10 MG PO TABS Oral Take 1 tablet (10 mg total) by mouth every 6 (six) hours as needed (nausea). 30 tablet 0  . METOPROLOL SUCCINATE ER 100 MG PO TB24 Oral Take 100 mg by mouth 2 (two) times daily.      Marland Kitchen FISH OIL 1200 MG PO CAPS Oral Take 1 capsule by mouth 3 (three) times daily.     . WARFARIN SODIUM 5 MG PO TABS Oral Take 5 mg by mouth daily.        BP 148/83  Pulse 71  Temp(Src) 97.3 F (  36.3 C) (Oral)  Resp 18  Ht 6' (1.829 m)  Wt 196 lb (88.905 kg)  BMI 26.58 kg/m2  SpO2 98%  Vital signs normal    Physical Exam  Constitutional: She is oriented to person, place, and time. She appears well-developed and well-nourished.  Non-toxic appearance. She does not appear ill. No distress.  HENT:  Head: Normocephalic and atraumatic.  Right Ear: External ear normal.  Left Ear: External ear normal.  Nose: Nose normal. No mucosal edema or rhinorrhea.  Mouth/Throat: Oropharynx is clear and moist and mucous membranes are normal. No dental abscesses or uvula swelling.       Patient has absolutely no pain to palpation of her head.  Eyes: Conjunctivae and EOM are normal. Pupils are equal, round, and reactive to light.  Neck: Normal range of motion and full passive range of motion without pain. Neck supple.  Cardiovascular: Normal rate, regular rhythm and normal heart sounds.  Exam reveals no gallop and no friction rub.   No murmur heard. Pulmonary/Chest: Effort normal and breath sounds normal. No respiratory distress. She has no wheezes. She has no rhonchi. She has no rales. She  exhibits no tenderness and no crepitus.  Abdominal: Soft. Normal appearance and bowel sounds are normal. She exhibits no distension. There is no tenderness. There is no rebound and no guarding.  Musculoskeletal: Normal range of motion. She exhibits no edema and no tenderness.       Patient is unable to move her right lower leg because of pain in her right hip. There is no shortening or external rotation or internal rotation noted. As good distal pulses. She started to have a lot of bruising on the volar aspect of her left wrist. She does have good range of motion of her wrist. She also has bruising and swelling over the proximal left little finger that is painful on range of motion. She has some bruising on her right elbow however she has excellent range of motion without pain.  Neurological: She is alert and oriented to person, place, and time. She has normal strength. No cranial nerve deficit.  Skin: Skin is warm, dry and intact. No rash noted. No erythema. No pallor.  Psychiatric: She has a normal mood and affect. Her speech is normal and behavior is normal. Her mood appears not anxious.    ED Course  Procedures (including critical care time)    Medications  0.9 %  sodium chloride infusion (  Intravenous New Bag/Given 11/15/11 1656)  aspirin EC 81 MG tablet (not administered)  meclizine (ANTIVERT) tablet 25 mg (not administered)  metoCLOPramide (REGLAN) tablet 10 mg (not administered)  diltiazem (TIAZAC) 24 hr capsule 180 mg (not administered)  gabapentin (NEURONTIN) capsule 100 mg (not administered)  levothyroxine (SYNTHROID, LEVOTHROID) tablet 25 mcg (not administered)  metoprolol succinate (TOPROL-XL) 24 hr tablet 100 mg (not administered)  morphine 4 MG/ML injection 4 mg (4 mg Intravenous Given 11/15/11 1657)  ondansetron (ZOFRAN) injection 4 mg (4 mg Intravenous Given 11/15/11 1657)   19:11 Dr Luna Glasgow, asks hospitalist to admit  1942 Dr [place] Pink, admit to tel  Finger splint  applied.    Results for orders placed during the hospital encounter of 11/15/11  CBC      Component Value Range   WBC 4.2  4.0 - 10.5 (K/uL)   RBC 3.57 (*) 3.87 - 5.11 (MIL/uL)   Hemoglobin 14.7  12.0 - 15.0 (g/dL)   HCT 42.6  36.0 - 46.0 (%)   MCV 119.3 (*)  78.0 - 100.0 (fL)   MCH 41.2 (*) 26.0 - 34.0 (pg)   MCHC 34.5  30.0 - 36.0 (g/dL)   RDW 16.2 (*) 11.5 - 15.5 (%)   Platelets 303  150 - 400 (K/uL)  DIFFERENTIAL      Component Value Range   Neutrophils Relative 77  43 - 77 (%)   Neutro Abs 3.2  1.7 - 7.7 (K/uL)   Lymphocytes Relative 20  12 - 46 (%)   Lymphs Abs 0.8  0.7 - 4.0 (K/uL)   Monocytes Relative 3  3 - 12 (%)   Monocytes Absolute 0.1  0.1 - 1.0 (K/uL)   Eosinophils Relative 0  0 - 5 (%)   Eosinophils Absolute 0.0  0.0 - 0.7 (K/uL)   Basophils Relative 0  0 - 1 (%)   Basophils Absolute 0.0  0.0 - 0.1 (K/uL)  SAMPLE TO BLOOD BANK      Component Value Range   Blood Bank Specimen [id number]     Sample Expiration 11/18/2011    URINALYSIS, ROUTINE W REFLEX MICROSCOPIC      Component Value Range   Color, Urine YELLOW  YELLOW    APPearance CLEAR  CLEAR    Specific Gravity, Urine 1.020  1.005 - 1.030    pH 6.0  5.0 - 8.0    Glucose, UA NEGATIVE  NEGATIVE (mg/dL)   Hgb urine dipstick NEGATIVE  NEGATIVE    Bilirubin Urine NEGATIVE  NEGATIVE    Ketones, ur TRACE (*) NEGATIVE (mg/dL)   Protein, ur NEGATIVE  NEGATIVE (mg/dL)   Urobilinogen, UA 1.0  0.0 - 1.0 (mg/dL)   Nitrite NEGATIVE  NEGATIVE    Leukocytes, UA NEGATIVE  NEGATIVE   COMPREHENSIVE METABOLIC PANEL      Component Value Range   Sodium 138  135 - 145 (mEq/L)   Potassium 3.6  3.5 - 5.1 (mEq/L)   Chloride 102  96 - 112 (mEq/L)   CO2 24  19 - 32 (mEq/L)   Glucose, Bld 95  70 - 99 (mg/dL)   BUN 20  6 - 23 (mg/dL)   Creatinine, Ser 0.78  0.50 - 1.10 (mg/dL)   Calcium 9.5  8.4 - 10.5 (mg/dL)   Total Protein 7.2  6.0 - 8.3 (g/dL)   Albumin 4.0  3.5 - 5.2 (g/dL)   AST 22  0 - 37 (U/L)   ALT 14  0 - 35 (U/L)    Alkaline Phosphatase 84  39 - 117 (U/L)   Total Bilirubin 1.1  0.3 - 1.2 (mg/dL)   GFR calc non Af Amer 83 (*) >90 (mL/min)   GFR calc Af Amer >90  >90 (mL/min)  PROTIME-INR      Component Value Range   Prothrombin Time 27.3 (*) 11.6 - 15.2 (seconds)   INR 2.49 (*) 0.00 - 1.49   APTT      Component Value Range   aPTT 55 (*) 24 - 37 (seconds)    Laboratory interpretation all normal except therapeutic INR therapeutic  Dg Chest 1 View  11/15/2011  *RADIOLOGY REPORT*  Clinical Data: Question right hip fracture  CHEST - 1 VIEW  Comparison: Chest radiograph 05/12/2009.  Findings: Stable enlarged heart silhouette.  No effusion, infiltrate, or pneumothorax. No acute osseous abnormality.  IMPRESSION: No acute cardiopulmonary process.  Original Report Authenticated By: Suzy Bouchard, M.D.   Dg Wrist Complete Left  11/15/2011  *RADIOLOGY REPORT*  Clinical Data: Fall.  Pain and bruising across posterior wrist.  LEFT WRIST -  COMPLETE 3+ VIEW  Comparison: Hand films from the same day.  Findings: A nondisplaced fracture at the base of the fifth proximal phalanx is again noted.  The carpal bones are intact.  No significant bone or soft tissue abnormalities present at the wrist.  IMPRESSION:  1.  Nondisplaced fracture involving the proximal phalanx in the fifth digit. 2.  No acute abnormality of the wrist.  Original Report Authenticated By: Resa Miner. MATTERN, M.D.   Dg Hip Complete Right  11/15/2011  *RADIOLOGY REPORT*  Clinical Data: Lateral hip pain status post fall today.  RIGHT HIP - COMPLETE 2+ VIEW  Comparison: None.  Findings: There is generalized osteopenia.  There is a mildly impacted fracture of the mid right femoral neck.  There is no evidence of pelvic fracture or dislocation.  No significant hip joint space loss is identified.  IMPRESSION: Mildly impacted acute fracture of the mid right femoral neck. Per CMS PQRS reporting requirements (PQRS Measure 24): Given the patient's age of greater  than 71 and the fracture site (hip, distal radius, or spine), the patient should be tested for osteoporosis using DXA, and the appropriate treatment considered based on the DXA results.  Original Report Authenticated By: Vivia Ewing, M.D.   Dg Hand Complete Left  11/15/2011  *RADIOLOGY REPORT*  Clinical Data: Status post fall.  Proximal hand laceration.  LEFT HAND - COMPLETE 3+ VIEW  Comparison: None.  Findings: There is generalized osteopenia with interphalangeal joint space loss.  There is suspicion of a nondisplaced intra- articular fracture involving the ulnar base of the fifth proximal phalanx, best seen on the oblique view.  No other acute fractures are identified.  No foreign bodies are seen.  IMPRESSION: Suspected fracture of the base of the fifth proximal phalanx. Correlate clinically.  Mild interphalangeal degenerative changes.  Original Report Authenticated By: Vivia Ewing, M.D.   Dg Finger Little Left  11/15/2011  *RADIOLOGY REPORT*  Clinical Data: Left little finger injury and pain.  LEFT LITTLE FINGER 2+V  Comparison: None  Findings: A fracture at the base of the proximal phalanx is identified extending into the joint.  No significant displacement is identified. There is no evidence of subluxation or dislocation. Soft tissue swelling is identified.  IMPRESSION: Nondisplaced intrarticular fracture at the base of the proximal phalanx.  Original Report Authenticated By: Lura Em, M.D.    Date: 11/15/2011  Rate: 82  Rhythm: atrial fibrillation  QRS Axis: normal  Intervals: normal  ST/T Wave abnormalities: nonspecific T wave changes  Conduction Disutrbances:none  Narrative Interpretation:   Old EKG Reviewed: none available      1. Polycythemia vera   2. Hip fracture, right   3. Finger fracture, left   4. Fall   5. Warfarin-induced coagulopathy    Plan admission  Rolland Porter, MD, [place], MD 11/15/11 [phone]

## 2011-11-15 NOTE — ED Notes (Signed)
Pt fell on the steps today. States that she missed her step and fell. C/o pain in her right hip and left hand.

## 2011-11-15 NOTE — H&P (Signed)
Charlotte [name] is an 71 y.o. female.    PCP: Charlotte Kilts, MD, MD   Chief Complaint: Pain in the right hip, as well as in the left hand  HPI: This is a 71 year old, Caucasian female, with a past medical history of chronic atrial fibrillation on anticoagulation, hypertension, history of vertigo, polycythemia vera, who was in her usual state of health earlier today when she missed a step at her house and fell. She used her left arm to block the fall. As a result she fell on her right hip, as well as injured her left hand and left wrist area. She did not hit her head. Did not have any syncopal episodes. No chest pain. She denies any dizziness or lightheadedness prior to the fall. Pain is reasonably well controlled at this time. She is extremely hard of hearing. She denies any chest pain with exertion at home. She's had the stress test, and cardiac catheterization in the past according to the patient, but doesn't have any stents.   Home Medications: Prior to Admission medications   Medication Sig Start Date End Date Taking? Authorizing Provider  aspirin EC 81 MG tablet Take 81 mg by mouth daily.   Yes Historical Provider, MD  diltiazem (TIAZAC) 180 MG 24 hr capsule Take 180 mg by mouth daily.   Yes Historical Provider, MD  gabapentin (NEURONTIN) 100 MG capsule Take 100 mg by mouth 3 (three) times daily.     Yes Historical Provider, MD  hydroxyurea (HYDREA) 500 MG capsule Taking 2 pills each morning and 1 pill each evening. 10/01/11  Yes Baird Cancer, PA  levothyroxine (SYNTHROID, LEVOTHROID) 25 MCG tablet Take 25 mcg by mouth daily.     Yes Historical Provider, MD  meclizine (ANTIVERT) 25 MG tablet Take 1 tablet (25 mg total) by mouth 3 (three) times daily as needed. 11/07/11 123XX123 Yes Charlotte Fuel, MD  metoCLOPramide (REGLAN) 10 MG tablet Take 1 tablet (10 mg total) by mouth every 6 (six) hours as needed (nausea). 11/07/11 123XX123 Yes Charlotte Fuel, MD  metoprolol (TOPROL-XL) 100 MG 24 hr  tablet Take 100 mg by mouth 2 (two) times daily.     Yes Historical Provider, MD  Omega-3 Fatty Acids (FISH OIL) 1200 MG CAPS Take 1 capsule by mouth 3 (three) times daily.    Yes Historical Provider, MD  warfarin (COUMADIN) 5 MG tablet Take 5 mg by mouth daily.     Yes Historical Provider, MD    Allergies: No Known Allergies  Past Medical History: Past Medical History  Diagnosis Date  . Hypertension   . Arthritis   . Varicose veins   . Deaf   . Elevated WBC count     and platelets  . Thyroid disease   . Irregular heart rate   . Polycythemia vera   . Peripheral neuropathy     feet  . Polycythemia vera 10/01/2011  . Atrial fibrillation, chronic     Past Surgical History  Procedure Date  . Blt   . Abdominal hysterectomy   . Coronary angioplasty   . Ct of abd     Social History:  reports that she has never smoked. She does not have any smokeless tobacco history on file. She reports that she does not drink alcohol or use illicit drugs.  Family History:  Family History  Problem Relation Age of Onset  . Diabetes Son     Review of Systems - History obtained from the patient General ROS: negative Psychological  ROS: negative Ophthalmic ROS: negative ENT ROS: positive for - hearing impairment Allergy and Immunology ROS:  Hematological and Lymphatic ROS: negative Endocrine ROS: negative Respiratory ROS: no cough, shortness of breath, or wheezing Cardiovascular ROS: no chest pain or dyspnea on exertion Gastrointestinal ROS: no abdominal pain, change in bowel habits, or black or bloody stools Genito-Urinary ROS: no dysuria, trouble voiding, or hematuria Musculoskeletal ROS: As in history of present illness Neurological ROS: negative Dermatological ROS: negative  Physical Examination Blood pressure 148/83, pulse 71, temperature 97.3 F (36.3 C), temperature source Oral, resp. rate 18, height 6' (1.829 m), weight 88.905 kg (196 lb), SpO2 98.00%.  General appearance:  alert, cooperative, appears stated age and no distress Head: Normocephalic, without obvious abnormality, atraumatic Eyes: conjunctivae/corneas clear. PERRL, EOM's intact.  Throat: lips, mucosa, and tongue normal; teeth and gums normal Neck: no adenopathy, no carotid bruit, no JVD, supple, symmetrical, trachea midline and thyroid not enlarged, symmetric, no tenderness/mass/nodules Resp: clear to auscultation bilaterally Cardio: irregular rhythm, no murmur, click, rub or gallop GI: soft, non-tender; bowel sounds normal; no masses,  no organomegaly Extremities: Right lower extremity is externally rotated. She's bruises on her left arm and wrist as well as hand. She has bruising and swelling around the left fifth digit as well. She is limited range of motion in that digit. She's got good range of motion in the left elbow as well as left wrist Pulses: 2+ and symmetric Skin: Skin color, texture, turgor normal. No rashes or lesions Lymph nodes: Cervical, supraclavicular, and axillary nodes normal. Neurologic: Grossly normal  Laboratory Data: Results for orders placed during the hospital encounter of 11/15/11 (from the past 48 hour(s))  CBC     Status: Abnormal   Collection Time   11/15/11  4:54 PM      Component Value Range Comment   WBC 4.2  4.0 - 10.5 (K/uL)    RBC 3.57 (*) 3.87 - 5.11 (MIL/uL)    Hemoglobin 14.7  12.0 - 15.0 (g/dL)    HCT 42.6  36.0 - 46.0 (%)    MCV 119.3 (*) 78.0 - 100.0 (fL)    MCH 41.2 (*) 26.0 - 34.0 (pg)    MCHC 34.5  30.0 - 36.0 (g/dL)    RDW 16.2 (*) 11.5 - 15.5 (%)    Platelets 303  150 - 400 (K/uL)   DIFFERENTIAL     Status: Normal   Collection Time   11/15/11  4:54 PM      Component Value Range Comment   Neutrophils Relative 77  43 - 77 (%)    Neutro Abs 3.2  1.7 - 7.7 (K/uL)    Lymphocytes Relative 20  12 - 46 (%)    Lymphs Abs 0.8  0.7 - 4.0 (K/uL)    Monocytes Relative 3  3 - 12 (%)    Monocytes Absolute 0.1  0.1 - 1.0 (K/uL)    Eosinophils Relative 0   0 - 5 (%)    Eosinophils Absolute 0.0  0.0 - 0.7 (K/uL)    Basophils Relative 0  0 - 1 (%)    Basophils Absolute 0.0  0.0 - 0.1 (K/uL)   SAMPLE TO BLOOD BANK     Status: Normal   Collection Time   11/15/11  4:54 PM      Component Value Range Comment   Blood Bank Specimen [id number]      Sample Expiration 11/18/2011     URINALYSIS, ROUTINE W REFLEX MICROSCOPIC     Status: Abnormal  Collection Time   11/15/11  5:24 PM      Component Value Range Comment   Color, Urine YELLOW  YELLOW     APPearance CLEAR  CLEAR     Specific Gravity, Urine 1.020  1.005 - 1.030     pH 6.0  5.0 - 8.0     Glucose, UA NEGATIVE  NEGATIVE (mg/dL)    Hgb urine dipstick NEGATIVE  NEGATIVE     Bilirubin Urine NEGATIVE  NEGATIVE     Ketones, ur TRACE (*) NEGATIVE (mg/dL)    Protein, ur NEGATIVE  NEGATIVE (mg/dL)    Urobilinogen, UA 1.0  0.0 - 1.0 (mg/dL)    Nitrite NEGATIVE  NEGATIVE     Leukocytes, UA NEGATIVE  NEGATIVE  MICROSCOPIC NOT DONE ON URINES WITH NEGATIVE PROTEIN, BLOOD, LEUKOCYTES, NITRITE, OR GLUCOSE <1000 mg/dL.  COMPREHENSIVE METABOLIC PANEL     Status: Abnormal   Collection Time   11/15/11  5:30 PM      Component Value Range Comment   Sodium 138  135 - 145 (mEq/L)    Potassium 3.6  3.5 - 5.1 (mEq/L)    Chloride 102  96 - 112 (mEq/L)    CO2 24  19 - 32 (mEq/L)    Glucose, Bld 95  70 - 99 (mg/dL)    BUN 20  6 - 23 (mg/dL)    Creatinine, Ser 0.78  0.50 - 1.10 (mg/dL)    Calcium 9.5  8.4 - 10.5 (mg/dL)    Total Protein 7.2  6.0 - 8.3 (g/dL)    Albumin 4.0  3.5 - 5.2 (g/dL)    AST 22  0 - 37 (U/L)    ALT 14  0 - 35 (U/L)    Alkaline Phosphatase 84  39 - 117 (U/L)    Total Bilirubin 1.1  0.3 - 1.2 (mg/dL)    GFR calc non Af Amer 83 (*) >90 (mL/min)    GFR calc Af Amer >90  >90 (mL/min)   PROTIME-INR     Status: Abnormal   Collection Time   11/15/11  5:30 PM      Component Value Range Comment   Prothrombin Time 27.3 (*) 11.6 - 15.2 (seconds)    INR 2.49 (*) 0.00 - 1.49    APTT     Status:  Abnormal   Collection Time   11/15/11  5:30 PM      Component Value Range Comment   aPTT 55 (*) 24 - 37 (seconds)     Radiology Reports: Dg Chest 1 View  11/15/2011  *RADIOLOGY REPORT*  Clinical Data: Question right hip fracture  CHEST - 1 VIEW  Comparison: Chest radiograph 05/12/2009.  Findings: Stable enlarged heart silhouette.  No effusion, infiltrate, or pneumothorax. No acute osseous abnormality.  IMPRESSION: No acute cardiopulmonary process.  Original Report Authenticated By: Suzy Bouchard, M.D.   Dg Wrist Complete Left  11/15/2011  *RADIOLOGY REPORT*  Clinical Data: Fall.  Pain and bruising across posterior wrist.  LEFT WRIST - COMPLETE 3+ VIEW  Comparison: Hand films from the same day.  Findings: A nondisplaced fracture at the base of the fifth proximal phalanx is again noted.  The carpal bones are intact.  No significant bone or soft tissue abnormalities present at the wrist.  IMPRESSION:  1.  Nondisplaced fracture involving the proximal phalanx in the fifth digit. 2.  No acute abnormality of the wrist.  Original Report Authenticated By: Resa Miner. MATTERN, M.D.   Dg Hip Complete Right  11/15/2011  *  RADIOLOGY REPORT*  Clinical Data: Lateral hip pain status post fall today.  RIGHT HIP - COMPLETE 2+ VIEW  Comparison: None.  Findings: There is generalized osteopenia.  There is a mildly impacted fracture of the mid right femoral neck.  There is no evidence of pelvic fracture or dislocation.  No significant hip joint space loss is identified.  IMPRESSION: Mildly impacted acute fracture of the mid right femoral neck. Per CMS PQRS reporting requirements (PQRS Measure 24): Given the patient's age of greater than 22 and the fracture site (hip, distal radius, or spine), the patient should be tested for osteoporosis using DXA, and the appropriate treatment considered based on the DXA results.  Original Report Authenticated By: Vivia Ewing, M.D.   Dg Hand Complete Left  11/15/2011   *RADIOLOGY REPORT*  Clinical Data: Status post fall.  Proximal hand laceration.  LEFT HAND - COMPLETE 3+ VIEW  Comparison: None.  Findings: There is generalized osteopenia with interphalangeal joint space loss.  There is suspicion of a nondisplaced intra- articular fracture involving the ulnar base of the fifth proximal phalanx, best seen on the oblique view.  No other acute fractures are identified.  No foreign bodies are seen.  IMPRESSION: Suspected fracture of the base of the fifth proximal phalanx. Correlate clinically.  Mild interphalangeal degenerative changes.  Original Report Authenticated By: Vivia Ewing, M.D.   Dg Finger Little Left  11/15/2011  *RADIOLOGY REPORT*  Clinical Data: Left little finger injury and pain.  LEFT LITTLE FINGER 2+V  Comparison: None  Findings: A fracture at the base of the proximal phalanx is identified extending into the joint.  No significant displacement is identified. There is no evidence of subluxation or dislocation. Soft tissue swelling is identified.  IMPRESSION: Nondisplaced intrarticular fracture at the base of the proximal phalanx.  Original Report Authenticated By: Lura Em, M.D.    Electrocardiogram: Atrial fibrillation at 82 beats per minute. Normal axis. Normal Intervals. PVCs are seen. No acute ST or T-wave changes are noted. No Q waves are present.  Assessment/Plan  Principal Problem:  *Hip fracture, right Active Problems:  Polycythemia vera  Chronic atrial fibrillation  Anticoagulant long-term use  HTN (hypertension)  Hypothyroidism   #1 right hip fracture: This will need to be operated on. We await orthopedic consultation. They have been notified. Patient does have atrial fibrillation. However, she doesn't have any other high risk predictors. She's got good functional capacity. She had an echocardiogram in 2010, which showed normal. EF. At this time patient may proceed to surgery without additional testing, when her INR is below a  level that is comfortable for her surgeon.  #2 fracture of the left fifth digit of the hand: Will defer this to the orthopedic surgeon. No other injuries identified  #3 chronic atrial fibrillation: She has had DC cardioversion in the past, which has not been successful. Rate is very well controlled at this time. Continue with Cardizem as well as beta blockers. We will hold anticoagulation for the surgery. May continue with aspirin.  #4 history of polycythemia vera: This is stable.  #5 history of hypertension, stable.  #6 history of hypothyroidism. Continue with Synthroid.  #7 history of vertigo. Continue with meclizine as needed  DVT, prophylaxis. She has had therapeutic INR.  Further management decisions will depend on results of further testing and patient's response to treatment.  [facility]  [facility] Pager [phone]  11/15/2011, 8:09 PM

## 2011-11-15 NOTE — ED Notes (Signed)
Floor nurse will call me back

## 2011-11-16 ENCOUNTER — Inpatient Hospital Stay (HOSPITAL_COMMUNITY): Payer: Medicare Other

## 2011-11-16 DIAGNOSIS — IMO0002 Reserved for concepts with insufficient information to code with codable children: Secondary | ICD-10-CM | POA: Diagnosis not present

## 2011-11-16 DIAGNOSIS — S72009A Fracture of unspecified part of neck of unspecified femur, initial encounter for closed fracture: Secondary | ICD-10-CM | POA: Diagnosis not present

## 2011-11-16 LAB — CBC
Hemoglobin: 11.8 g/dL — ABNORMAL LOW (ref 12.0–15.0)
MCV: 120.1 fL — ABNORMAL HIGH (ref 78.0–100.0)
Platelets: 238 10*3/uL (ref 150–400)
RBC: 2.84 MIL/uL — ABNORMAL LOW (ref 3.87–5.11)
WBC: 3.6 10*3/uL — ABNORMAL LOW (ref 4.0–10.5)

## 2011-11-16 LAB — COMPREHENSIVE METABOLIC PANEL
AST: 16 U/L (ref 0–37)
Albumin: 3.3 g/dL — ABNORMAL LOW (ref 3.5–5.2)
BUN: 19 mg/dL (ref 6–23)
Calcium: 9.1 mg/dL (ref 8.4–10.5)
Chloride: 104 mEq/L (ref 96–112)
Creatinine, Ser: 0.75 mg/dL (ref 0.50–1.10)
Total Bilirubin: 1.5 mg/dL — ABNORMAL HIGH (ref 0.3–1.2)
Total Protein: 6.3 g/dL (ref 6.0–8.3)

## 2011-11-16 MED ORDER — DILTIAZEM HCL ER COATED BEADS 180 MG PO CP24
ORAL_CAPSULE | ORAL | Status: AC
  Filled 2011-11-16: qty 1

## 2011-11-16 MED ORDER — HYDROXYUREA 500 MG PO CAPS
500.0000 mg | ORAL_CAPSULE | Freq: Every day | ORAL | Status: DC
  Administered 2011-11-16 – 2011-11-20 (×5): 500 mg via ORAL
  Filled 2011-11-16 (×4): qty 1

## 2011-11-16 MED ORDER — DILTIAZEM HCL ER COATED BEADS 180 MG PO CP24
180.0000 mg | ORAL_CAPSULE | Freq: Every day | ORAL | Status: DC
  Administered 2011-11-16 – 2011-11-21 (×5): 180 mg via ORAL
  Filled 2011-11-16 (×5): qty 1

## 2011-11-16 MED ORDER — HYDROXYUREA 500 MG PO CAPS
1000.0000 mg | ORAL_CAPSULE | Freq: Every day | ORAL | Status: DC
  Administered 2011-11-16 – 2011-11-21 (×5): 1000 mg via ORAL
  Filled 2011-11-16 (×6): qty 2

## 2011-11-16 MED ORDER — BIOTENE DRY MOUTH MT LIQD
15.0000 mL | Freq: Two times a day (BID) | OROMUCOSAL | Status: DC
  Administered 2011-11-16 – 2011-11-21 (×9): 15 mL via OROMUCOSAL

## 2011-11-16 MED ORDER — PHYTONADIONE 5 MG PO TABS
5.0000 mg | ORAL_TABLET | Freq: Once | ORAL | Status: AC
  Administered 2011-11-16: 5 mg via ORAL
  Filled 2011-11-16: qty 1

## 2011-11-16 NOTE — Progress Notes (Signed)
Subjective: Pain is controlled, no chest pain or shortness of breath  Objective: Vital signs in last 24 hours: Temp:  [97.3 F (36.3 C)-97.7 F (36.5 C)] 97.7 F (36.5 C) (04/14 0559) Pulse Rate:  [71-99] 72  (04/14 0559) Resp:  [16-20] 20  (04/14 0559) BP: (115-148)/(74-88) 115/74 mmHg (04/14 0559) SpO2:  [84 %-98 %] 90 % (04/14 0559) Weight:  [88.905 kg (196 lb)] 88.905 kg (196 lb) (04/13 1517) Weight change:  Last BM Date: 11/15/11  Intake/Output from previous day: 04/13 0701 - 04/14 0700 In: 218.3 [I.V.:218.3] Out: -      Physical Exam: General: Alert, awake, oriented x3, in no acute distress. HEENT: No bruits, no goiter. Heart: irregular Lungs: Clear to auscultation bilaterally. Abdomen: Soft, nontender, nondistended, positive bowel sounds. Extremities: No clubbing cyanosis or edema with positive pedal pulses. Neuro: Grossly intact, nonfocal.    Lab Results: Basic Metabolic Panel:  Basename 11/16/11 0655 11/15/11 1730  NA 136 138  K 4.4 3.6  CL 104 102  CO2 23 24  GLUCOSE 123* 95  BUN 19 20  CREATININE 0.75 0.78  CALCIUM 9.1 9.5  MG -- --  PHOS -- --   Liver Function Tests:  [facility] 11/16/11 0655 11/15/11 1730  AST 16 22  ALT 7 14  ALKPHOS 68 84  BILITOT 1.5* 1.1  PROT 6.3 7.2  ALBUMIN 3.3* 4.0   No results found for this basename: LIPASE:2,AMYLASE:2 in the last 72 hours No results found for this basename: AMMONIA:2 in the last 72 hours CBC:  Basename 11/16/11 0655 11/15/11 1654  WBC 3.6* 4.2  NEUTROABS -- 3.2  HGB 11.8* 14.7  HCT 34.1* 42.6  MCV 120.1* 119.3*  PLT 238 303   Cardiac Enzymes: No results found for this basename: CKTOTAL:3,CKMB:3,CKMBINDEX:3,TROPONINI:3 in the last 72 hours BNP: No results found for this basename: PROBNP:3 in the last 72 hours D-Dimer: No results found for this basename: DDIMER:2 in the last 72 hours CBG: No results found for this basename: GLUCAP:6 in the last 72 hours Hemoglobin A1C: No results  found for this basename: HGBA1C in the last 72 hours Fasting Lipid Panel: No results found for this basename: CHOL,HDL,LDLCALC,TRIG,CHOLHDL,LDLDIRECT in the last 72 hours Thyroid Function Tests: No results found for this basename: TSH,T4TOTAL,FREET4,T3FREE,THYROIDAB in the last 72 hours Anemia Panel: No results found for this basename: VITAMINB12,FOLATE,FERRITIN,TIBC,IRON,RETICCTPCT in the last 72 hours Coagulation:  Basename 11/16/11 0655 11/15/11 1730  LABPROT 26.8* 27.3*  INR 2.43* 2.49*   Urine Drug Screen: Drugs of Abuse  No results found for this basename: labopia, cocainscrnur, labbenz, amphetmu, thcu, labbarb    Alcohol Level: No results found for this basename: ETH:2 in the last 72 hours Urinalysis:  Basename 11/15/11 1724  COLORURINE YELLOW  LABSPEC 1.020  PHURINE 6.0  GLUCOSEU NEGATIVE  HGBUR NEGATIVE  BILIRUBINUR NEGATIVE  KETONESUR TRACE*  PROTEINUR NEGATIVE  UROBILINOGEN 1.0  NITRITE NEGATIVE  LEUKOCYTESUR NEGATIVE    No results found for this or any previous visit (from the past 240 hour(s)).  Studies/Results: Dg Chest 1 View  11/15/2011  *RADIOLOGY REPORT*  Clinical Data: Question right hip fracture  CHEST - 1 VIEW  Comparison: Chest radiograph 05/12/2009.  Findings: Stable enlarged heart silhouette.  No effusion, infiltrate, or pneumothorax. No acute osseous abnormality.  IMPRESSION: No acute cardiopulmonary process.  Original Report Authenticated By: Suzy Bouchard, M.D.   Dg Wrist Complete Left  11/15/2011  *RADIOLOGY REPORT*  Clinical Data: Fall.  Pain and bruising across posterior wrist.  LEFT WRIST - COMPLETE  3+ VIEW  Comparison: Hand films from the same day.  Findings: A nondisplaced fracture at the base of the fifth proximal phalanx is again noted.  The carpal bones are intact.  No significant bone or soft tissue abnormalities present at the wrist.  IMPRESSION:  1.  Nondisplaced fracture involving the proximal phalanx in the fifth digit. 2.  No acute  abnormality of the wrist.  Original Report Authenticated By: Resa Miner. MATTERN, M.D.   Dg Hip Complete Right  11/15/2011  *RADIOLOGY REPORT*  Clinical Data: Lateral hip pain status post fall today.  RIGHT HIP - COMPLETE 2+ VIEW  Comparison: None.  Findings: There is generalized osteopenia.  There is a mildly impacted fracture of the mid right femoral neck.  There is no evidence of pelvic fracture or dislocation.  No significant hip joint space loss is identified.  IMPRESSION: Mildly impacted acute fracture of the mid right femoral neck. Per CMS PQRS reporting requirements (PQRS Measure 24): Given the patient's age of greater than 34 and the fracture site (hip, distal radius, or spine), the patient should be tested for osteoporosis using DXA, and the appropriate treatment considered based on the DXA results.  Original Report Authenticated By: Vivia Ewing, M.D.   Dg Hand Complete Left  11/15/2011  *RADIOLOGY REPORT*  Clinical Data: Status post fall.  Proximal hand laceration.  LEFT HAND - COMPLETE 3+ VIEW  Comparison: None.  Findings: There is generalized osteopenia with interphalangeal joint space loss.  There is suspicion of a nondisplaced intra- articular fracture involving the ulnar base of the fifth proximal phalanx, best seen on the oblique view.  No other acute fractures are identified.  No foreign bodies are seen.  IMPRESSION: Suspected fracture of the base of the fifth proximal phalanx. Correlate clinically.  Mild interphalangeal degenerative changes.  Original Report Authenticated By: Vivia Ewing, M.D.   Dg Finger Little Left  11/15/2011  *RADIOLOGY REPORT*  Clinical Data: Left little finger injury and pain.  LEFT LITTLE FINGER 2+V  Comparison: None  Findings: A fracture at the base of the proximal phalanx is identified extending into the joint.  No significant displacement is identified. There is no evidence of subluxation or dislocation. Soft tissue swelling is identified.   IMPRESSION: Nondisplaced intrarticular fracture at the base of the proximal phalanx.  Original Report Authenticated By: Lura Em, M.D.    Medications: Scheduled Meds:   . antiseptic oral rinse  15 mL Mouth Rinse BID  . aspirin EC  81 mg Oral Daily  . diltiazem  180 mg Oral Daily  . docusate sodium  100 mg Oral BID  . gabapentin  100 mg Oral TID  . hydroxyurea  1,000 mg Oral Daily   And  . hydroxyurea  500 mg Oral QHS  . levothyroxine  25 mcg Oral Daily  . metoprolol succinate  100 mg Oral BID  .  morphine injection  4 mg Intravenous Once  . ondansetron (ZOFRAN) IV  4 mg Intravenous Once  . sodium chloride  3 mL Intravenous Q12H  . DISCONTD: diltiazem  180 mg Oral Daily  . DISCONTD: diltiazem  180 mg Oral Daily  . DISCONTD: gabapentin  100 mg Oral TID  . DISCONTD: hydroxyurea  500-1,000 mg Oral BID  . DISCONTD: levothyroxine  25 mcg Oral Daily  . DISCONTD: metoprolol succinate  100 mg Oral BID   Continuous Infusions:   . dextrose 5 % and 0.45% NaCl 50 mL/hr at 11/15/11 2358  . DISCONTD: sodium chloride  75 mL/hr at 11/15/11 1656   PRN Meds:.acetaminophen, acetaminophen, albuterol, HYDROmorphone, meclizine, metoprolol, ondansetron (ZOFRAN) IV, ondansetron, oxyCODONE, DISCONTD: meclizine, DISCONTD: metoCLOPramide  Assessment/Plan:  Principal Problem:  *Hip fracture, right Active Problems:  Polycythemia vera  Chronic atrial fibrillation  Anticoagulant long-term use  HTN (hypertension)  Hypothyroidism  Plan:  1. Hip fracture/ fracture of left hand, await orthopedics input, would likely require surgical repair.  2. Chronic Atrial Fib.  Appears to be rate controlled.  She is anticoagulated.  Will hold further anticoagulation and give small dose of vit k in anticipation for surgery.  Continue rate control meds and aspirin.  Will need to restart anticoagulation with lovenox/coumadin after surgery  3. History of polycythemia vera, stable  4. Hypothyroidism, stable    LOS: 1 day   Abeera Flannery [facility] Pager: [phone] 11/16/2011, 12:08 PM

## 2011-11-16 NOTE — Consult Note (Signed)
Reason for Consult:fracture of the right hip and left little finger Referring Physician: hospitalist  Charlotte [name] is an 71 y.o. female.  HPI: She fell at home yesterday and hurt her right hip and left little finger.  Xrays in the ER showed fracture of the right femoral neck nondisplaced and of the left little finger proximal phalanx nondisplaced.  She is on Coumadin for atrial fibrillation.  She will need surgery on the right hip once her blood count has returned to normal.  Past Medical History  Diagnosis Date  . Hypertension   . Arthritis   . Varicose veins   . Deaf   . Elevated WBC count     and platelets  . Thyroid disease   . Irregular heart rate   . Polycythemia vera   . Peripheral neuropathy     feet  . Polycythemia vera 10/01/2011  . Atrial fibrillation, chronic     Past Surgical History  Procedure Date  . Blt   . Abdominal hysterectomy   . Coronary angioplasty   . Ct of abd     Family History  Problem Relation Age of Onset  . Diabetes Son     Social History:  reports that she has never smoked. She does not have any smokeless tobacco history on file. She reports that she does not drink alcohol or use illicit drugs.  Allergies: No Known Allergies  Medications: I have reviewed the patient's current medications.  Results for orders placed during the hospital encounter of 11/15/11 (from the past 48 hour(s))  CBC     Status: Abnormal   Collection Time   11/15/11  4:54 PM      Component Value Range Comment   WBC 4.2  4.0 - 10.5 (K/uL)    RBC 3.57 (*) 3.87 - 5.11 (MIL/uL)    Hemoglobin 14.7  12.0 - 15.0 (g/dL)    HCT 42.6  36.0 - 46.0 (%)    MCV 119.3 (*) 78.0 - 100.0 (fL)    MCH 41.2 (*) 26.0 - 34.0 (pg)    MCHC 34.5  30.0 - 36.0 (g/dL)    RDW 16.2 (*) 11.5 - 15.5 (%)    Platelets 303  150 - 400 (K/uL)   DIFFERENTIAL     Status: Normal   Collection Time   11/15/11  4:54 PM      Component Value Range Comment   Neutrophils Relative 77  43 - 77 (%)    Neutro Abs 3.2  1.7 - 7.7 (K/uL)    Lymphocytes Relative 20  12 - 46 (%)    Lymphs Abs 0.8  0.7 - 4.0 (K/uL)    Monocytes Relative 3  3 - 12 (%)    Monocytes Absolute 0.1  0.1 - 1.0 (K/uL)    Eosinophils Relative 0  0 - 5 (%)    Eosinophils Absolute 0.0  0.0 - 0.7 (K/uL)    Basophils Relative 0  0 - 1 (%)    Basophils Absolute 0.0  0.0 - 0.1 (K/uL)   SAMPLE TO BLOOD BANK     Status: Normal   Collection Time   11/15/11  4:54 PM      Component Value Range Comment   Blood Bank Specimen [id number]      Sample Expiration 11/18/2011     URINALYSIS, ROUTINE W REFLEX MICROSCOPIC     Status: Abnormal   Collection Time   11/15/11  5:24 PM      Component Value Range Comment  Color, Urine YELLOW  YELLOW     APPearance CLEAR  CLEAR     Specific Gravity, Urine 1.020  1.005 - 1.030     pH 6.0  5.0 - 8.0     Glucose, UA NEGATIVE  NEGATIVE (mg/dL)    Hgb urine dipstick NEGATIVE  NEGATIVE     Bilirubin Urine NEGATIVE  NEGATIVE     Ketones, ur TRACE (*) NEGATIVE (mg/dL)    Protein, ur NEGATIVE  NEGATIVE (mg/dL)    Urobilinogen, UA 1.0  0.0 - 1.0 (mg/dL)    Nitrite NEGATIVE  NEGATIVE     Leukocytes, UA NEGATIVE  NEGATIVE  MICROSCOPIC NOT DONE ON URINES WITH NEGATIVE PROTEIN, BLOOD, LEUKOCYTES, NITRITE, OR GLUCOSE <1000 mg/dL.  COMPREHENSIVE METABOLIC PANEL     Status: Abnormal   Collection Time   11/15/11  5:30 PM      Component Value Range Comment   Sodium 138  135 - 145 (mEq/L)    Potassium 3.6  3.5 - 5.1 (mEq/L)    Chloride 102  96 - 112 (mEq/L)    CO2 24  19 - 32 (mEq/L)    Glucose, Bld 95  70 - 99 (mg/dL)    BUN 20  6 - 23 (mg/dL)    Creatinine, Ser 0.78  0.50 - 1.10 (mg/dL)    Calcium 9.5  8.4 - 10.5 (mg/dL)    Total Protein 7.2  6.0 - 8.3 (g/dL)    Albumin 4.0  3.5 - 5.2 (g/dL)    AST 22  0 - 37 (U/L)    ALT 14  0 - 35 (U/L)    Alkaline Phosphatase 84  39 - 117 (U/L)    Total Bilirubin 1.1  0.3 - 1.2 (mg/dL)    GFR calc non Af Amer 83 (*) >90 (mL/min)    GFR calc Af Amer >90  >90  (mL/min)   PROTIME-INR     Status: Abnormal   Collection Time   11/15/11  5:30 PM      Component Value Range Comment   Prothrombin Time 27.3 (*) 11.6 - 15.2 (seconds)    INR 2.49 (*) 0.00 - 1.49    APTT     Status: Abnormal   Collection Time   11/15/11  5:30 PM      Component Value Range Comment   aPTT 55 (*) 24 - 37 (seconds)   COMPREHENSIVE METABOLIC PANEL     Status: Abnormal   Collection Time   11/16/11  6:55 AM      Component Value Range Comment   Sodium 136  135 - 145 (mEq/L)    Potassium 4.4  3.5 - 5.1 (mEq/L) DELTA CHECK NOTED   Chloride 104  96 - 112 (mEq/L)    CO2 23  19 - 32 (mEq/L)    Glucose, Bld 123 (*) 70 - 99 (mg/dL)    BUN 19  6 - 23 (mg/dL)    Creatinine, Ser 0.75  0.50 - 1.10 (mg/dL)    Calcium 9.1  8.4 - 10.5 (mg/dL)    Total Protein 6.3  6.0 - 8.3 (g/dL)    Albumin 3.3 (*) 3.5 - 5.2 (g/dL)    AST 16  0 - 37 (U/L)    ALT 7  0 - 35 (U/L)    Alkaline Phosphatase 68  39 - 117 (U/L)    Total Bilirubin 1.5 (*) 0.3 - 1.2 (mg/dL)    GFR calc non Af Amer 84 (*) >90 (mL/min)    GFR calc  Af Amer >90  >90 (mL/min)   CBC     Status: Abnormal   Collection Time   11/16/11  6:55 AM      Component Value Range Comment   WBC 3.6 (*) 4.0 - 10.5 (K/uL)    RBC 2.84 (*) 3.87 - 5.11 (MIL/uL)    Hemoglobin 11.8 (*) 12.0 - 15.0 (g/dL) DELTA CHECK NOTED   HCT 34.1 (*) 36.0 - 46.0 (%)    MCV 120.1 (*) 78.0 - 100.0 (fL)    MCH 41.5 (*) 26.0 - 34.0 (pg)    MCHC 34.6  30.0 - 36.0 (g/dL)    RDW 16.1 (*) 11.5 - 15.5 (%)    Platelets 238  150 - 400 (K/uL)   PROTIME-INR     Status: Abnormal   Collection Time   11/16/11  6:55 AM      Component Value Range Comment   Prothrombin Time 26.8 (*) 11.6 - 15.2 (seconds)    INR 2.43 (*) 0.00 - 1.49      Dg Chest 1 View  11/15/2011  *RADIOLOGY REPORT*  Clinical Data: Question right hip fracture  CHEST - 1 VIEW  Comparison: Chest radiograph 05/12/2009.  Findings: Stable enlarged heart silhouette.  No effusion, infiltrate, or pneumothorax. No  acute osseous abnormality.  IMPRESSION: No acute cardiopulmonary process.  Original Report Authenticated By: Suzy Bouchard, M.D.   Dg Wrist Complete Left  11/15/2011  *RADIOLOGY REPORT*  Clinical Data: Fall.  Pain and bruising across posterior wrist.  LEFT WRIST - COMPLETE 3+ VIEW  Comparison: Hand films from the same day.  Findings: A nondisplaced fracture at the base of the fifth proximal phalanx is again noted.  The carpal bones are intact.  No significant bone or soft tissue abnormalities present at the wrist.  IMPRESSION:  1.  Nondisplaced fracture involving the proximal phalanx in the fifth digit. 2.  No acute abnormality of the wrist.  Original Report Authenticated By: Resa Miner. MATTERN, M.D.   Dg Hip Complete Right  11/15/2011  *RADIOLOGY REPORT*  Clinical Data: Lateral hip pain status post fall today.  RIGHT HIP - COMPLETE 2+ VIEW  Comparison: None.  Findings: There is generalized osteopenia.  There is a mildly impacted fracture of the mid right femoral neck.  There is no evidence of pelvic fracture or dislocation.  No significant hip joint space loss is identified.  IMPRESSION: Mildly impacted acute fracture of the mid right femoral neck. Per CMS PQRS reporting requirements (PQRS Measure 24): Given the patient's age of greater than 20 and the fracture site (hip, distal radius, or spine), the patient should be tested for osteoporosis using DXA, and the appropriate treatment considered based on the DXA results.  Original Report Authenticated By: Vivia Ewing, M.D.   Dg Hand Complete Left  11/15/2011  *RADIOLOGY REPORT*  Clinical Data: Status post fall.  Proximal hand laceration.  LEFT HAND - COMPLETE 3+ VIEW  Comparison: None.  Findings: There is generalized osteopenia with interphalangeal joint space loss.  There is suspicion of a nondisplaced intra- articular fracture involving the ulnar base of the fifth proximal phalanx, best seen on the oblique view.  No other acute fractures are  identified.  No foreign bodies are seen.  IMPRESSION: Suspected fracture of the base of the fifth proximal phalanx. Correlate clinically.  Mild interphalangeal degenerative changes.  Original Report Authenticated By: Vivia Ewing, M.D.   Dg Finger Little Left  11/15/2011  *RADIOLOGY REPORT*  Clinical Data: Left little finger injury and pain.  LEFT LITTLE FINGER 2+V  Comparison: None  Findings: A fracture at the base of the proximal phalanx is identified extending into the joint.  No significant displacement is identified. There is no evidence of subluxation or dislocation. Soft tissue swelling is identified.  IMPRESSION: Nondisplaced intrarticular fracture at the base of the proximal phalanx.  Original Report Authenticated By: Lura Em, M.D.    Review of Systems  Constitutional: Negative.   HENT: Negative.   Eyes: Negative.   Respiratory: Negative.   Cardiovascular: Positive for palpitations.       History of atrial fibrillation  Gastrointestinal: Negative.   Genitourinary: Negative.   Musculoskeletal: Positive for joint pain (right hip) and falls (fell at home yesterday.  Hurt right hip and left little finger.).  Skin: Negative.   Neurological: Negative.   Endo/Heme/Allergies:       History of polycythemia vera  Psychiatric/Behavioral: Negative.    Blood pressure 115/74, pulse 72, temperature 97.7 F (36.5 C), temperature source Oral, resp. rate 20, height 6' (1.829 m), weight 88.905 kg (196 lb), SpO2 90.00%. Physical Exam  Constitutional: She is oriented to person, place, and time. She appears well-developed and well-nourished.  HENT:  Head: Normocephalic and atraumatic.  Eyes: Conjunctivae are normal. Pupils are equal, round, and reactive to light.  Neck: Normal range of motion. Neck supple.  Cardiovascular:       Irregular rhythm   Respiratory: Effort normal and breath sounds normal.  GI: Soft. Bowel sounds are normal.  Musculoskeletal: She exhibits tenderness (right  hip pain and left little finger in splint).       Hands:      Legs: Neurological: She is alert and oriented to person, place, and time. She has normal reflexes.  Skin: Skin is warm and dry.  Psychiatric: She has a normal mood and affect. Her behavior is normal. Judgment and thought content normal.    Assessment/Plan: Fracture of the right femoral neck of hip and the left little finger.  Continue splint for little finger. She will need surgery on the right hip.  If it remains nondisplaced, then Asnis pinning in situ.  If the hip displaces, she will need bipolar hip.  I have talked to her and her husband about this.  They appear to understand.  It will probably be Wednesday before surgery can be done because of the Coumadin.  Charlotte [name] 11/16/2011, 1:08 PM

## 2011-11-17 LAB — BASIC METABOLIC PANEL
Calcium: 9.2 mg/dL (ref 8.4–10.5)
GFR calc non Af Amer: 86 mL/min — ABNORMAL LOW (ref >90)
Sodium: 137 mEq/L (ref 135–145)

## 2011-11-17 LAB — CBC
MCH: 41.5 pg — ABNORMAL HIGH (ref 26.0–34.0)
Platelets: 264 10*3/uL (ref 150–400)
RBC: 2.99 MIL/uL — ABNORMAL LOW (ref 3.87–5.11)
WBC: 4.1 10*3/uL (ref 4.0–10.5)

## 2011-11-17 LAB — URINE CULTURE: Colony Count: NO GROWTH

## 2011-11-17 LAB — PROTIME-INR
INR: 1.65 — ABNORMAL HIGH (ref 0.00–1.49)
Prothrombin Time: 19.8 seconds — ABNORMAL HIGH (ref 11.6–15.2)

## 2011-11-17 MED ORDER — ENOXAPARIN SODIUM 100 MG/ML ~~LOC~~ SOLN
1.0000 mg/kg | Freq: Two times a day (BID) | SUBCUTANEOUS | Status: DC
  Administered 2011-11-17 – 2011-11-18 (×2): 90 mg via SUBCUTANEOUS
  Filled 2011-11-17 (×8): qty 1

## 2011-11-17 NOTE — Progress Notes (Signed)
Teaching done with patient about hip fracture as well as finger fracture.  Also gave additional information on atrial fibrillation.  Spoke with her at length about these and the benefit of Buck's traction.  Patient verbalized understanding.   D. Deneisha Dade. RN

## 2011-11-17 NOTE — Progress Notes (Signed)
UR Chart Review Completed  

## 2011-11-17 NOTE — Progress Notes (Signed)
ANTICOAGULATION CONSULT NOTE - Initial Consult  Pharmacy Consult for Lovenox  Indication: atrial fibrillation  No Known Allergies  Patient Measurements: Height: 6' (182.9 cm) Weight: 196 lb (88.905 kg) IBW/kg (Calculated) : 73.1    Vital Signs: Temp: 98.1 F (36.7 C) (04/15 0608) Temp src: Oral (04/15 0608) BP: 157/118 mmHg (04/15 0608) Pulse Rate: 81  (04/15 0608)  Labs:  Basename 11/17/11 0541 11/16/11 0655 11/15/11 1730 11/15/11 1654  HGB 12.4 11.8* -- --  HCT 36.1 34.1* -- 42.6  PLT 264 238 -- 303  APTT -- -- 55* --  LABPROT 19.8* 26.8* 27.3* --  INR 1.65* 2.43* 2.49* --  HEPARINUNFRC -- -- -- --  CREATININE 0.72 0.75 0.78 --  CKTOTAL -- -- -- --  CKMB -- -- -- --  TROPONINI -- -- -- --   Estimated Creatinine Clearance: 83.2 ml/min (by C-G formula based on Cr of 0.72).  Medical History: Past Medical History  Diagnosis Date  . Hypertension   . Arthritis   . Varicose veins   . Deaf   . Elevated WBC count     and platelets  . Thyroid disease   . Irregular heart rate   . Polycythemia vera   . Peripheral neuropathy     feet  . Polycythemia vera 10/01/2011  . Atrial fibrillation, chronic     Medications:  Scheduled:    . antiseptic oral rinse  15 mL Mouth Rinse BID  . aspirin EC  81 mg Oral Daily  . diltiazem  180 mg Oral Daily  . docusate sodium  100 mg Oral BID  . enoxaparin (LOVENOX) injection  1 mg/kg Subcutaneous Q12H  . gabapentin  100 mg Oral TID  . hydroxyurea  1,000 mg Oral Daily   And  . hydroxyurea  500 mg Oral QHS  . levothyroxine  25 mcg Oral Daily  . metoprolol succinate  100 mg Oral BID  . DISCONTD: sodium chloride  3 mL Intravenous Q12H    Assessment: Ok for protocol Good Renal function  Goal of Therapy:  Lovenox full dose anticoagulation   Plan:  Lovenox 1 mg/kg every 12 hours  Labs per protocol   Abner Greenspan, Kemonie Cutillo Bennett 11/17/2011,4:08 PM

## 2011-11-17 NOTE — Progress Notes (Signed)
Subjective: My hip is OK.   Objective: Vital signs in last 24 hours: Temp:  [97.6 F (36.4 C)-98.5 F (36.9 C)] 98.1 F (36.7 C) (04/15 0608) Pulse Rate:  [54-81] 81  (04/15 0608) Resp:  [16-20] 20  (04/15 0608) BP: (93-157)/(56-118) 157/118 mmHg (04/15 0608) SpO2:  [90 %-91 %] 91 % (04/15 0608)  Intake/Output from previous day: 04/14 0701 - 04/15 0700 In: 943.3 [P.O.:360; I.V.:583.3] Out: 500 [Urine:500] Intake/Output this shift:     Basename 11/17/11 0541 11/16/11 0655 11/15/11 1654  HGB 12.4 11.8* 14.7    Basename 11/17/11 0541 11/16/11 0655  WBC 4.1 3.6*  RBC 2.99* 2.84*  HCT 36.1 34.1*  PLT 264 238    Basename 11/17/11 0541 11/16/11 0655  NA 137 136  K 3.7 4.4  CL 104 104  CO2 24 23  BUN 14 19  CREATININE 0.72 0.75  GLUCOSE 111* 123*  CALCIUM 9.2 9.1    Basename 11/17/11 0541 11/16/11 0655  LABPT -- --  INR 1.65* 2.43*    Neurologically intact Neurovascular intact Sensation intact distally Intact pulses distally Dorsiflexion/Plantar flexion intact  Assessment/Plan: Her INR is decreased today.  I will not be able to do the surgery until Wednesday at the earliest.  I will post the case today for Wednesday.   Charlotte [name] 11/17/2011, 7:39 AM

## 2011-11-17 NOTE — Progress Notes (Signed)
Subjective: No complaints of pain or shortness of breath  Objective: Vital signs in last 24 hours: Temp:  [98.1 F (36.7 C)-98.5 F (36.9 C)] 98.1 F (36.7 C) (04/15 0608) Pulse Rate:  [69-81] 81  (04/15 0608) Resp:  [20] 20  (04/15 0608) BP: (122-157)/(76-118) 157/118 mmHg (04/15 0608) SpO2:  [90 %-91 %] 91 % (04/15 [id number]) Weight change:  Last BM Date: 11/15/11  Intake/Output from previous day: 04/14 0701 - 04/15 0700 In: 943.3 [P.O.:360; I.V.:583.3] Out: 500 [Urine:500] Total I/O In: 480 [P.O.:480] Out: -    Physical Exam: General: Alert, awake, oriented x3, in no acute distress. HEENT: No bruits, no goiter. Heart: irregular Lungs: Clear to auscultation bilaterally. Abdomen: Soft, nontender, nondistended, positive bowel sounds. Extremities: No clubbing cyanosis or edema with positive pedal pulses. Neuro: Grossly intact, nonfocal.    Lab Results: Basic Metabolic Panel:  Basename 11/17/11 0541 11/16/11 0655  NA 137 136  K 3.7 4.4  CL 104 104  CO2 24 23  GLUCOSE 111* 123*  BUN 14 19  CREATININE 0.72 0.75  CALCIUM 9.2 9.1  MG -- --  PHOS -- --   Liver Function Tests:  [facility] 11/16/11 0655 11/15/11 1730  AST 16 22  ALT 7 14  ALKPHOS 68 84  BILITOT 1.5* 1.1  PROT 6.3 7.2  ALBUMIN 3.3* 4.0   No results found for this basename: LIPASE:2,AMYLASE:2 in the last 72 hours No results found for this basename: AMMONIA:2 in the last 72 hours CBC:  Basename 11/17/11 0541 11/16/11 0655 11/15/11 1654  WBC 4.1 3.6* --  NEUTROABS -- -- 3.2  HGB 12.4 11.8* --  HCT 36.1 34.1* --  MCV 120.7* 120.1* --  PLT 264 238 --   Cardiac Enzymes: No results found for this basename: CKTOTAL:3,CKMB:3,CKMBINDEX:3,TROPONINI:3 in the last 72 hours BNP: No results found for this basename: PROBNP:3 in the last 72 hours D-Dimer: No results found for this basename: DDIMER:2 in the last 72 hours CBG: No results found for this basename: GLUCAP:6 in the last 72 hours Hemoglobin  A1C: No results found for this basename: HGBA1C in the last 72 hours Fasting Lipid Panel: No results found for this basename: CHOL,HDL,LDLCALC,TRIG,CHOLHDL,LDLDIRECT in the last 72 hours Thyroid Function Tests: No results found for this basename: TSH,T4TOTAL,FREET4,T3FREE,THYROIDAB in the last 72 hours Anemia Panel: No results found for this basename: VITAMINB12,FOLATE,FERRITIN,TIBC,IRON,RETICCTPCT in the last 72 hours Coagulation:  Basename 11/17/11 0541 11/16/11 0655  LABPROT 19.8* 26.8*  INR 1.65* 2.43*   Urine Drug Screen: Drugs of Abuse  No results found for this basename: labopia, cocainscrnur, labbenz, amphetmu, thcu, labbarb    Alcohol Level: No results found for this basename: ETH:2 in the last 72 hours Urinalysis:  Basename 11/15/11 1724  COLORURINE YELLOW  LABSPEC 1.020  PHURINE 6.0  GLUCOSEU NEGATIVE  HGBUR NEGATIVE  BILIRUBINUR NEGATIVE  KETONESUR TRACE*  PROTEINUR NEGATIVE  UROBILINOGEN 1.0  NITRITE NEGATIVE  LEUKOCYTESUR NEGATIVE    No results found for this or any previous visit (from the past 240 hour(s)).  Studies/Results: Dg Wrist Complete Left  11/15/2011  *RADIOLOGY REPORT*  Clinical Data: Fall.  Pain and bruising across posterior wrist.  LEFT WRIST - COMPLETE 3+ VIEW  Comparison: Hand films from the same day.  Findings: A nondisplaced fracture at the base of the fifth proximal phalanx is again noted.  The carpal bones are intact.  No significant bone or soft tissue abnormalities present at the wrist.  IMPRESSION:  1.  Nondisplaced fracture involving the proximal phalanx in the  fifth digit. 2.  No acute abnormality of the wrist.  Original Report Authenticated By: Resa Miner. MATTERN, M.D.   Dg Hip 1 View Right  11/17/2011  *RADIOLOGY REPORT*  Clinical Data: Follow up right hip fracture.  Assess for interval displacement.  RIGHT HIP - 1 VIEW  Comparison: Right hip radiographs performed 11/15/2011  Findings: A minimally displaced transcervical fracture  is again noted through the right femoral neck, grossly unchanged from the prior study.  The right femoral head remains seated at the acetabulum.  No significant soft tissue abnormalities are characterized on radiograph.  No new fractures are seen.  IMPRESSION: No significant interval change in the minimally displaced transcervical fracture through the right femoral neck.  Original Report Authenticated By: Santa Lighter, M.D.   Dg Finger Little Left  11/15/2011  *RADIOLOGY REPORT*  Clinical Data: Left little finger injury and pain.  LEFT LITTLE FINGER 2+V  Comparison: None  Findings: A fracture at the base of the proximal phalanx is identified extending into the joint.  No significant displacement is identified. There is no evidence of subluxation or dislocation. Soft tissue swelling is identified.  IMPRESSION: Nondisplaced intrarticular fracture at the base of the proximal phalanx.  Original Report Authenticated By: Lura Em, M.D.    Medications: Scheduled Meds:   . antiseptic oral rinse  15 mL Mouth Rinse BID  . aspirin EC  81 mg Oral Daily  . diltiazem  180 mg Oral Daily  . docusate sodium  100 mg Oral BID  . gabapentin  100 mg Oral TID  . hydroxyurea  1,000 mg Oral Daily   And  . hydroxyurea  500 mg Oral QHS  . levothyroxine  25 mcg Oral Daily  . metoprolol succinate  100 mg Oral BID  . DISCONTD: sodium chloride  3 mL Intravenous Q12H   Continuous Infusions:   . dextrose 5 % and 0.45% NaCl 50 mL/hr at 11/15/11 2358   PRN Meds:.acetaminophen, acetaminophen, HYDROmorphone, meclizine, metoprolol, ondansetron (ZOFRAN) IV, ondansetron, oxyCODONE, DISCONTD: albuterol  Assessment/Plan:  Principal Problem:  *Hip fracture, right Active Problems:  Polycythemia vera  Chronic atrial fibrillation  Anticoagulant long-term use  HTN (hypertension)  Hypothyroidism  Plan:  1. Hip fracture/ fracture of left hand, surgical repair of hip per ortho .  2. Chronic Atrial Fib. Appears to be  rate controlled. She was on coumadin but this is being reversed for surgery.  We will start lovenox now since her INR is less than 2.  3. History of polycythemia vera, stable   4. Hypothyroidism, stable    LOS: 2 days   Tamaya Pun [facility] Pager: [phone] 11/17/2011, 3:53 PM

## 2011-11-18 LAB — PROTIME-INR: Prothrombin Time: 16.5 seconds — ABNORMAL HIGH (ref 11.6–15.2)

## 2011-11-18 LAB — SURGICAL PCR SCREEN: MRSA, PCR: NEGATIVE

## 2011-11-18 LAB — ABO/RH: ABO/RH(D): A POS

## 2011-11-18 MED ORDER — CEFAZOLIN SODIUM 1-5 GM-% IV SOLN
1.0000 g | Freq: Once | INTRAVENOUS | Status: DC
  Filled 2011-11-18: qty 50

## 2011-11-18 MED ORDER — POVIDONE-IODINE 10 % EX SOLN
Freq: Once | CUTANEOUS | Status: AC
  Administered 2011-11-18: 23:00:00 via TOPICAL
  Filled 2011-11-18: qty 118

## 2011-11-18 NOTE — Progress Notes (Signed)
Subjective: No complaints today, pain controlled.  Objective: Vital signs in last 24 hours: Temp:  [97.5 F (36.4 C)-98.2 F (36.8 C)] 97.5 F (36.4 C) (04/16 0445) Pulse Rate:  [87-91] 91  (04/16 0445) Resp:  [20] 20  (04/16 0445) BP: (135-149)/(94-95) 149/95 mmHg (04/16 0445) SpO2:  [91 %-92 %] 91 % (04/16 0445) Weight change:  Last BM Date: 11/14/11  Intake/Output from previous day: 04/15 0701 - 04/16 0700 In: 720 [P.O.:720] Out: 3450 [Urine:3450]     Physical Exam: General: Alert, awake, oriented x3, in no acute distress. HEENT: No bruits, no goiter. Heart: irregular Lungs: Clear to auscultation bilaterally. Abdomen: Soft, nontender, nondistended, positive bowel sounds. Extremities: No clubbing cyanosis or edema with positive pedal pulses. Neuro: Grossly intact, nonfocal.    Lab Results: Basic Metabolic Panel:  Basename 11/17/11 0541 11/16/11 0655  NA 137 136  K 3.7 4.4  CL 104 104  CO2 24 23  GLUCOSE 111* 123*  BUN 14 19  CREATININE 0.72 0.75  CALCIUM 9.2 9.1  MG -- --  PHOS -- --   Liver Function Tests:  [facility] 11/16/11 0655 11/15/11 1730  AST 16 22  ALT 7 14  ALKPHOS 68 84  BILITOT 1.5* 1.1  PROT 6.3 7.2  ALBUMIN 3.3* 4.0   No results found for this basename: LIPASE:2,AMYLASE:2 in the last 72 hours No results found for this basename: AMMONIA:2 in the last 72 hours CBC:  Basename 11/17/11 0541 11/16/11 0655 11/15/11 1654  WBC 4.1 3.6* --  NEUTROABS -- -- 3.2  HGB 12.4 11.8* --  HCT 36.1 34.1* --  MCV 120.7* 120.1* --  PLT 264 238 --   Cardiac Enzymes: No results found for this basename: CKTOTAL:3,CKMB:3,CKMBINDEX:3,TROPONINI:3 in the last 72 hours BNP: No results found for this basename: PROBNP:3 in the last 72 hours D-Dimer: No results found for this basename: DDIMER:2 in the last 72 hours CBG: No results found for this basename: GLUCAP:6 in the last 72 hours Hemoglobin A1C: No results found for this basename: HGBA1C in the last  72 hours Fasting Lipid Panel: No results found for this basename: CHOL,HDL,LDLCALC,TRIG,CHOLHDL,LDLDIRECT in the last 72 hours Thyroid Function Tests: No results found for this basename: TSH,T4TOTAL,FREET4,T3FREE,THYROIDAB in the last 72 hours Anemia Panel: No results found for this basename: VITAMINB12,FOLATE,FERRITIN,TIBC,IRON,RETICCTPCT in the last 72 hours Coagulation:  Basename 11/18/11 0900 11/17/11 0541  LABPROT 16.5* 19.8*  INR 1.31 1.65*   Urine Drug Screen: Drugs of Abuse  No results found for this basename: labopia, cocainscrnur, labbenz, amphetmu, thcu, labbarb    Alcohol Level: No results found for this basename: ETH:2 in the last 72 hours Urinalysis:  Basename 11/15/11 1724  COLORURINE YELLOW  LABSPEC 1.020  PHURINE 6.0  GLUCOSEU NEGATIVE  HGBUR NEGATIVE  BILIRUBINUR NEGATIVE  KETONESUR TRACE*  PROTEINUR NEGATIVE  UROBILINOGEN 1.0  NITRITE NEGATIVE  LEUKOCYTESUR NEGATIVE   Recent Results (from the past 240 hour(s))  URINE CULTURE     Status: Normal   Collection Time   11/15/11  5:24 PM      Component Value Range Status Comment   Specimen Description URINE, CATHETERIZED   Final    Special Requests NONE   Final    Culture  Setup Time [id number]   Final    Colony Count NO GROWTH   Final    Culture NO GROWTH   Final    Report Status 11/17/2011 FINAL   Final     Studies/Results: Dg Hip 1 View Right  11/17/2011  *RADIOLOGY REPORT*  Clinical Data: Follow up right hip fracture.  Assess for interval displacement.  RIGHT HIP - 1 VIEW  Comparison: Right hip radiographs performed 11/15/2011  Findings: A minimally displaced transcervical fracture is again noted through the right femoral neck, grossly unchanged from the prior study.  The right femoral head remains seated at the acetabulum.  No significant soft tissue abnormalities are characterized on radiograph.  No new fractures are seen.  IMPRESSION: No significant interval change in the minimally displaced  transcervical fracture through the right femoral neck.  Original Report Authenticated By: Santa Lighter, M.D.    Medications: Scheduled Meds:   . antiseptic oral rinse  15 mL Mouth Rinse BID  . aspirin EC  81 mg Oral Daily  .  ceFAZolin (ANCEF) IV  1 g Intravenous Once  . diltiazem  180 mg Oral Daily  . docusate sodium  100 mg Oral BID  . enoxaparin (LOVENOX) injection  1 mg/kg Subcutaneous Q12H  . gabapentin  100 mg Oral TID  . hydroxyurea  1,000 mg Oral Daily   And  . hydroxyurea  500 mg Oral QHS  . levothyroxine  25 mcg Oral Daily  . metoprolol succinate  100 mg Oral BID  . povidone-iodine   Topical Once   Continuous Infusions:   . DISCONTD: dextrose 5 % and 0.45% NaCl 50 mL/hr at 11/15/11 2358   PRN Meds:.acetaminophen, acetaminophen, HYDROmorphone, meclizine, metoprolol, ondansetron (ZOFRAN) IV, ondansetron, oxyCODONE  Assessment/Plan:  Principal Problem:  *Hip fracture, right Active Problems:  Polycythemia vera  Chronic atrial fibrillation  Anticoagulant long-term use  HTN (hypertension)  Hypothyroidism   Plan:  1. Hip fracture/ fracture of left hand, surgical repair of hip per ortho  .  2. Chronic Atrial Fib. Appears to be rate controlled. She was on coumadin but this is being held  for surgery. Currently on full dose lovenox.  Will hold dose tonight in anticipation for surgery tomorrow  3. History of polycythemia vera, on hydroxyurea, stable   4. Hypothyroidism, stable    LOS: 3 days   Ziah Leandro [facility] Pager: [phone] 11/18/2011, 1:29 PM

## 2011-11-18 NOTE — Progress Notes (Signed)
   CARE MANAGEMENT NOTE 11/18/2011  Patient:  Charlotte [name], Charlotte [name]   Account Number:  [id number]  Date Initiated:  11/18/2011  Documentation initiated by:  Claretha Cooper  Subjective/Objective Assessment:   pt admitted 4/13 13 for fractured /r hip  after fall. Due to surgical schedule, was unable to schedule her untile 11/19/11. PTA lived at home with her husband at home.     Action/Plan:   Spoke with pt at bedside. After surgery we anticipate  HH and DME needs. Pt selected [place]. We will speak again after surgery to better assess [facility] DME needs   Anticipated DC Date:  11/22/2011   Anticipated DC Plan:  [place]  CM consult      Choice offered to / List presented to:     DME arranged  3-N-1      DME agency  [place] arranged  HH-1 RN  [place].   Status of service:  In process, will continue to follow Medicare Important Message given?   (If response is "NO", the following Medicare IM given date fields will be blank) Date Medicare IM given:   Date Additional Medicare IM given:    Discharge Disposition:    Per UR Regulation:    If discussed at Long Length of Stay Meetings, dates discussed:    Comments:  11/18/11 [place]

## 2011-11-18 NOTE — Progress Notes (Signed)
Subjective: I am ready to get my hip fixed.   Objective: Vital signs in last 24 hours: Temp:  [97.5 F (36.4 C)-98.2 F (36.8 C)] 97.5 F (36.4 C) (04/16 0445) Pulse Rate:  [87-91] 91  (04/16 0445) Resp:  [20] 20  (04/16 0445) BP: (135-149)/(94-95) 149/95 mmHg (04/16 0445) SpO2:  [91 %-92 %] 91 % (04/16 0445)  Intake/Output from previous day: 04/15 0701 - 04/16 0700 In: 720 [P.O.:720] Out: 3450 [Urine:3450] Intake/Output this shift:     Basename 11/17/11 0541 11/16/11 0655 11/15/11 1654  HGB 12.4 11.8* 14.7    Basename 11/17/11 0541 11/16/11 0655  WBC 4.1 3.6*  RBC 2.99* 2.84*  HCT 36.1 34.1*  PLT 264 238    Basename 11/17/11 0541 11/16/11 0655  NA 137 136  K 3.7 4.4  CL 104 104  CO2 24 23  BUN 14 19  CREATININE 0.72 0.75  GLUCOSE 111* 123*  CALCIUM 9.2 9.1    Basename 11/18/11 0900 11/17/11 0541  LABPT -- --  INR 1.31 1.65*    Neurologically intact Neurovascular intact Sensation intact distally Intact pulses distally Dorsiflexion/Plantar flexion intact  Assessment/Plan: For surgery of the right hip tomorrow.   Ladale Sherburn 11/18/2011, 11:03 AM

## 2011-11-19 ENCOUNTER — Inpatient Hospital Stay (HOSPITAL_COMMUNITY): Payer: Medicare Other

## 2011-11-19 ENCOUNTER — Inpatient Hospital Stay (HOSPITAL_COMMUNITY): Payer: Medicare Other | Admitting: Anesthesiology

## 2011-11-19 ENCOUNTER — Encounter (HOSPITAL_COMMUNITY): Payer: Self-pay | Admitting: *Deleted

## 2011-11-19 ENCOUNTER — Encounter (HOSPITAL_COMMUNITY): Payer: Self-pay | Admitting: Anesthesiology

## 2011-11-19 ENCOUNTER — Encounter (HOSPITAL_COMMUNITY)
Admission: EM | Disposition: A | Discharge status: Home Health | Payer: Self-pay | Source: Home / Self Care | Attending: Internal Medicine

## 2011-11-19 HISTORY — PX: HIP PINNING,CANNULATED: SHX1758

## 2011-11-19 LAB — CBC
HCT: 38.7 % (ref 36.0–46.0)
MCHC: 33.9 g/dL (ref 30.0–36.0)
MCV: 120.9 fL — ABNORMAL HIGH (ref 78.0–100.0)
Platelets: 309 10*3/uL (ref 150–400)
RDW: 15.8 % — ABNORMAL HIGH (ref 11.5–15.5)

## 2011-11-19 LAB — BASIC METABOLIC PANEL
BUN: 13 mg/dL (ref 6–23)
Creatinine, Ser: 0.68 mg/dL (ref 0.50–1.10)
GFR calc Af Amer: >90 mL/min (ref >90)
GFR calc non Af Amer: 87 mL/min — ABNORMAL LOW (ref >90)

## 2011-11-19 SURGERY — FIXATION, FEMUR, NECK, PERCUTANEOUS, USING SCREW
Anesthesia: Spinal | Site: Hip | Laterality: Right | Wound class: Clean

## 2011-11-19 MED ORDER — FENTANYL CITRATE 0.05 MG/ML IJ SOLN
INTRAMUSCULAR | Status: DC | PRN
  Administered 2011-11-19: 50 ug via INTRAVENOUS
  Administered 2011-11-19: 25 ug via INTRATHECAL

## 2011-11-19 MED ORDER — SODIUM CHLORIDE 0.9 % IJ SOLN
INTRAMUSCULAR | Status: AC
  Filled 2011-11-19: qty 6

## 2011-11-19 MED ORDER — ENOXAPARIN SODIUM 150 MG/ML ~~LOC~~ SOLN
140.0000 mg | SUBCUTANEOUS | Status: DC
  Administered 2011-11-20 – 2011-11-21 (×2): 140 mg via SUBCUTANEOUS
  Filled 2011-11-19 (×5): qty 1

## 2011-11-19 MED ORDER — SODIUM CHLORIDE 0.9 % IJ SOLN: 9.0000 mL | INTRAMUSCULAR | Status: DC | PRN

## 2011-11-19 MED ORDER — ONDANSETRON HCL 4 MG/2ML IJ SOLN: 4.0000 mg | Freq: Four times a day (QID) | INTRAMUSCULAR | Status: DC | PRN

## 2011-11-19 MED ORDER — DIPHENHYDRAMINE HCL 50 MG/ML IJ SOLN: 12.5000 mg | Freq: Four times a day (QID) | INTRAMUSCULAR | Status: DC | PRN

## 2011-11-19 MED ORDER — NALOXONE HCL 0.4 MG/ML IJ SOLN: 0.4000 mg | INTRAMUSCULAR | Status: DC | PRN

## 2011-11-19 MED ORDER — LACTATED RINGERS IV SOLN
INTRAVENOUS | Status: DC
  Administered 2011-11-19: 1000 mL via INTRAVENOUS

## 2011-11-19 MED ORDER — ZOLPIDEM TARTRATE 5 MG PO TABS
5.0000 mg | ORAL_TABLET | Freq: Every day | ORAL | Status: DC
  Administered 2011-11-19 – 2011-11-20 (×2): 5 mg via ORAL
  Filled 2011-11-19 (×2): qty 1

## 2011-11-19 MED ORDER — ONDANSETRON HCL 4 MG/2ML IJ SOLN
INTRAMUSCULAR | Status: AC
  Filled 2011-11-19: qty 2

## 2011-11-19 MED ORDER — ACETAMINOPHEN 10 MG/ML IV SOLN
1000.0000 mg | Freq: Four times a day (QID) | INTRAVENOUS | Status: AC
  Administered 2011-11-19 – 2011-11-20 (×4): 1000 mg via INTRAVENOUS
  Filled 2011-11-19 (×4): qty 100

## 2011-11-19 MED ORDER — FENTANYL CITRATE 0.05 MG/ML IJ SOLN
INTRAMUSCULAR | Status: AC
  Filled 2011-11-19: qty 2

## 2011-11-19 MED ORDER — MORPHINE SULFATE (PF) 1 MG/ML IV SOLN
INTRAVENOUS | Status: DC
  Administered 2011-11-19: 4.5 mg via INTRAVENOUS
  Administered 2011-11-19: 16:00:00 via INTRAVENOUS
  Administered 2011-11-20: 1.5 mg via INTRAVENOUS
  Filled 2011-11-19: qty 25

## 2011-11-19 MED ORDER — MIDAZOLAM HCL 5 MG/5ML IJ SOLN
INTRAMUSCULAR | Status: DC | PRN
  Administered 2011-11-19: 2 mg via INTRAVENOUS

## 2011-11-19 MED ORDER — WARFARIN - PHARMACIST DOSING INPATIENT: Freq: Every day | Status: DC

## 2011-11-19 MED ORDER — ONDANSETRON HCL 4 MG/2ML IJ SOLN
4.0000 mg | Freq: Once | INTRAMUSCULAR | Status: AC | PRN
  Administered 2011-11-19: 4 mg via INTRAVENOUS

## 2011-11-19 MED ORDER — FENTANYL CITRATE 0.05 MG/ML IJ SOLN: 25.0000 ug | INTRAMUSCULAR | Status: DC | PRN

## 2011-11-19 MED ORDER — PROPOFOL 10 MG/ML IV BOLUS
INTRAVENOUS | Status: DC | PRN
  Administered 2011-11-19: 20 mg via INTRAVENOUS
  Administered 2011-11-19: 10 mg via INTRAVENOUS

## 2011-11-19 MED ORDER — ENOXAPARIN SODIUM 40 MG/0.4ML ~~LOC~~ SOLN: 40.0000 mg | SUBCUTANEOUS | Status: DC

## 2011-11-19 MED ORDER — BUPIVACAINE IN DEXTROSE 0.75-8.25 % IT SOLN
INTRATHECAL | Status: DC | PRN
  Administered 2011-11-19: 15 mg via INTRATHECAL

## 2011-11-19 MED ORDER — MIDAZOLAM HCL 2 MG/2ML IJ SOLN
1.0000 mg | INTRAMUSCULAR | Status: DC | PRN
  Administered 2011-11-19: 2 mg via INTRAVENOUS

## 2011-11-19 MED ORDER — BUPIVACAINE IN DEXTROSE 0.75-8.25 % IT SOLN
INTRATHECAL | Status: AC
  Filled 2011-11-19: qty 2

## 2011-11-19 MED ORDER — PROPOFOL 10 MG/ML IV EMUL
INTRAVENOUS | Status: AC
  Filled 2011-11-19: qty 20

## 2011-11-19 MED ORDER — MIDAZOLAM HCL 2 MG/2ML IJ SOLN
INTRAMUSCULAR | Status: AC
  Filled 2011-11-19: qty 2

## 2011-11-19 MED ORDER — DIPHENHYDRAMINE HCL 12.5 MG/5ML PO ELIX: 12.5000 mg | ORAL_SOLUTION | Freq: Four times a day (QID) | ORAL | Status: DC | PRN

## 2011-11-19 MED ORDER — DEXAMETHASONE SODIUM PHOSPHATE 4 MG/ML IJ SOLN
INTRAMUSCULAR | Status: AC
  Administered 2011-11-19: 4 mg via INTRAVENOUS
  Filled 2011-11-19: qty 1

## 2011-11-19 MED ORDER — DEXAMETHASONE SODIUM PHOSPHATE 4 MG/ML IJ SOLN
4.0000 mg | Freq: Once | INTRAMUSCULAR | Status: AC
  Administered 2011-11-19: 4 mg via INTRAVENOUS

## 2011-11-19 MED ORDER — PROPOFOL 10 MG/ML IV EMUL
INTRAVENOUS | Status: DC | PRN
  Administered 2011-11-19: 25 ug/kg/min via INTRAVENOUS

## 2011-11-19 MED ORDER — ONDANSETRON HCL 4 MG/2ML IJ SOLN
4.0000 mg | Freq: Once | INTRAMUSCULAR | Status: AC
  Administered 2011-11-19: 4 mg via INTRAVENOUS

## 2011-11-19 MED ORDER — PROMETHAZINE HCL 25 MG/ML IJ SOLN: 12.5000 mg | INTRAMUSCULAR | Status: DC | PRN

## 2011-11-19 MED ORDER — ONDANSETRON HCL 4 MG/2ML IJ SOLN
INTRAMUSCULAR | Status: AC
  Administered 2011-11-19: 4 mg via INTRAVENOUS
  Filled 2011-11-19: qty 2

## 2011-11-19 MED ORDER — WARFARIN SODIUM 5 MG PO TABS
5.0000 mg | ORAL_TABLET | Freq: Once | ORAL | Status: AC
  Administered 2011-11-19: 5 mg via ORAL
  Filled 2011-11-19: qty 1

## 2011-11-19 MED ORDER — ALBUTEROL SULFATE (5 MG/ML) 0.5% IN NEBU
2.5000 mg | INHALATION_SOLUTION | Freq: Four times a day (QID) | RESPIRATORY_TRACT | Status: DC
  Administered 2011-11-19 – 2011-11-21 (×5): 2.5 mg via RESPIRATORY_TRACT
  Filled 2011-11-19 (×7): qty 0.5

## 2011-11-19 MED ORDER — SODIUM CHLORIDE 0.9 % IR SOLN
Status: DC | PRN
  Administered 2011-11-19: 1000 mL

## 2011-11-19 SURGICAL SUPPLY — 44 items
BAG HAMPER (MISCELLANEOUS) ×2 IMPLANT
BLADE SURG SZ10 CARB STEEL (BLADE) ×2 IMPLANT
CLOTH BEACON ORANGE TIMEOUT ST (SAFETY) ×2 IMPLANT
COVER MAYO STAND XLG (DRAPE) ×2 IMPLANT
COVER SURGICAL LIGHT HANDLE (MISCELLANEOUS) ×4 IMPLANT
DRAPE STERI IOBAN 125X83 (DRAPES) ×2 IMPLANT
DRSG XEROFORM 1X8 (GAUZE/BANDAGES/DRESSINGS) ×2 IMPLANT
EVACUATOR 3/16  PVC DRAIN (DRAIN) ×1
EVACUATOR 3/16 PVC DRAIN (DRAIN) ×1 IMPLANT
GAUZE KERLIX 2  STERILE LF (GAUZE/BANDAGES/DRESSINGS) ×2 IMPLANT
GAUZE XEROFORM 5X9 LF (GAUZE/BANDAGES/DRESSINGS) IMPLANT
GLOVE BIO SURGEON STRL SZ8 (GLOVE) ×2 IMPLANT
GLOVE BIO SURGEON STRL SZ8.5 (GLOVE) ×2 IMPLANT
GLOVE BIOGEL PI IND STRL 7.0 (GLOVE) ×2 IMPLANT
GLOVE BIOGEL PI INDICATOR 7.0 (GLOVE) ×2
GLOVE ECLIPSE 6.5 STRL STRAW (GLOVE) ×4 IMPLANT
GLOVE EXAM NITRILE MD LF STRL (GLOVE) ×2 IMPLANT
GLOVE INDICATOR 7.0 STRL GRN (GLOVE) ×4 IMPLANT
GOWN STRL REIN XL XLG (GOWN DISPOSABLE) ×8 IMPLANT
GUIDEWIRE CALB ASNIS (WIRE) ×6 IMPLANT
INST SET MAJOR BONE (KITS) ×2 IMPLANT
KIT BLADEGUARD II DBL (SET/KITS/TRAYS/PACK) ×2 IMPLANT
KIT ROOM TURNOVER APOR (KITS) ×2 IMPLANT
MANIFOLD NEPTUNE II (INSTRUMENTS) ×2 IMPLANT
MARKER SKIN DUAL TIP RULER LAB (MISCELLANEOUS) ×2 IMPLANT
NS IRRIG 1000ML POUR BTL (IV SOLUTION) ×2 IMPLANT
PACK BASIC III (CUSTOM PROCEDURE TRAY) ×1
PACK SRG BSC III STRL LF ECLPS (CUSTOM PROCEDURE TRAY) ×1 IMPLANT
PAD ABD 5X9 TENDERSORB (GAUZE/BANDAGES/DRESSINGS) ×2 IMPLANT
PENCIL HANDSWITCHING (ELECTRODE) ×2 IMPLANT
SCREW ASNIS 6.5X105MM (Screw) ×1 IMPLANT
SCREW ASNIS 90MM (Screw) ×2 IMPLANT
SCREW ASNIS 95MM (Screw) ×2 IMPLANT
SCREW CANN ASNIS 6.5X105 STRL (Screw) ×1 IMPLANT
SET BASIN LINEN APH (SET/KITS/TRAYS/PACK) ×2 IMPLANT
SPONGE GAUZE 4X4 12PLY (GAUZE/BANDAGES/DRESSINGS) ×2 IMPLANT
SPONGE LAP 18X18 X RAY DECT (DISPOSABLE) ×2 IMPLANT
STAPLER VISISTAT 35W (STAPLE) ×2 IMPLANT
SUT BRALON NAB BRD #1 30IN (SUTURE) ×4 IMPLANT
SUT PLAIN 2 0 XLH (SUTURE) ×2 IMPLANT
SUT SILK 0 FSL (SUTURE) ×2 IMPLANT
SYR BULB IRRIGATION 50ML (SYRINGE) ×2 IMPLANT
TAPE MEDIFIX FOAM 3 (GAUZE/BANDAGES/DRESSINGS) ×2 IMPLANT
YANKAUER SUCT 12FT TUBE ARGYLE (SUCTIONS) ×2 IMPLANT

## 2011-11-19 NOTE — Progress Notes (Signed)
Subjective: Patient is seen in her room after surgery. She feels somewhat nauseous but denies any significant pain. Denies shortness of breath  Objective: Vital signs in last 24 hours: Temp:  [97.2 F (36.2 C)-98.3 F (36.8 C)] 97.2 F (36.2 C) (04/17 1619) Pulse Rate:  [84-104] 102  (04/17 1619) Resp:  [10-26] 20  (04/17 1619) BP: (102-168)/(59-113) 134/88 mmHg (04/17 1619) SpO2:  [90 %-97 %] 92 % (04/17 1619) Weight change:  Last BM Date: 11/18/11  Intake/Output from previous day: 04/16 0701 - 04/17 0700 In: 960 [P.O.:960] Out: 2450 [Urine:2450] Total I/O In: 1100 [I.V.:1100] Out: 400 [Urine:350; Blood:50]   Physical Exam: General: Alert, awake, oriented x3, in no acute distress. HEENT: No bruits, no goiter. Heart: irregular Lungs: Clear to auscultation bilaterally. Abdomen: Soft, nontender, nondistended, positive bowel sounds. Extremities: No clubbing cyanosis or edema with positive pedal pulses. Neuro: Grossly intact, nonfocal.    Lab Results: Basic Metabolic Panel:  Basename 11/19/11 0511 11/17/11 0541  NA 139 137  K 3.9 3.7  CL 104 104  CO2 25 24  GLUCOSE 97 111*  BUN 13 14  CREATININE 0.68 0.72  CALCIUM 9.7 9.2  MG -- --  PHOS -- --   Liver Function Tests: No results found for this basename: AST:2,ALT:2,ALKPHOS:2,BILITOT:2,PROT:2,ALBUMIN:2 in the last 72 hours No results found for this basename: LIPASE:2,AMYLASE:2 in the last 72 hours No results found for this basename: AMMONIA:2 in the last 72 hours CBC:  Basename 11/19/11 0511 11/17/11 0541  WBC 4.0 4.1  NEUTROABS -- --  HGB 13.1 12.4  HCT 38.7 36.1  MCV 120.9* 120.7*  PLT 309 264   Cardiac Enzymes: No results found for this basename: CKTOTAL:3,CKMB:3,CKMBINDEX:3,TROPONINI:3 in the last 72 hours BNP: No results found for this basename: PROBNP:3 in the last 72 hours D-Dimer: No results found for this basename: DDIMER:2 in the last 72 hours CBG: No results found for this basename:  GLUCAP:6 in the last 72 hours Hemoglobin A1C: No results found for this basename: HGBA1C in the last 72 hours Fasting Lipid Panel: No results found for this basename: CHOL,HDL,LDLCALC,TRIG,CHOLHDL,LDLDIRECT in the last 72 hours Thyroid Function Tests: No results found for this basename: TSH,T4TOTAL,FREET4,T3FREE,THYROIDAB in the last 72 hours Anemia Panel: No results found for this basename: VITAMINB12,FOLATE,FERRITIN,TIBC,IRON,RETICCTPCT in the last 72 hours Coagulation:  Basename 11/18/11 0900 11/17/11 0541  LABPROT 16.5* 19.8*  INR 1.31 1.65*   Urine Drug Screen: Drugs of Abuse  No results found for this basename: labopia, cocainscrnur, labbenz, amphetmu, thcu, labbarb    Alcohol Level: No results found for this basename: ETH:2 in the last 72 hours Urinalysis: No results found for this basename: COLORURINE:2,APPERANCEUR:2,LABSPEC:2,PHURINE:2,GLUCOSEU:2,HGBUR:2,BILIRUBINUR:2,KETONESUR:2,PROTEINUR:2,UROBILINOGEN:2,NITRITE:2,LEUKOCYTESUR:2 in the last 72 hours  Recent Results (from the past 240 hour(s))  URINE CULTURE     Status: Normal   Collection Time   11/15/11  5:24 PM      Component Value Range Status Comment   Specimen Description URINE, CATHETERIZED   Final    Special Requests NONE   Final    Culture  Setup Time [id number]   Final    Colony Count NO GROWTH   Final    Culture NO GROWTH   Final    Report Status 11/17/2011 FINAL   Final   SURGICAL PCR SCREEN     Status: Normal   Collection Time   11/18/11  3:03 PM      Component Value Range Status Comment   MRSA, PCR NEGATIVE  NEGATIVE  Final    Staphylococcus aureus NEGATIVE  NEGATIVE  Final     Studies/Results: Dg Hip Operative Right  11/19/2011  *RADIOLOGY REPORT*  Clinical Data: Fracture fixation.  OPERATIVE RIGHT HIP  Comparison: Plain films 11/15/2011.  Findings: We are provided three fluoroscopic intraoperative spot views of the right hip.  Images demonstrate placement of three pins for fixation of a  subcapital right hip fracture.  No evidence of complication.  IMPRESSION: ORIF right hip fracture.  Original Report Authenticated By: Arvid Right. Luther Parody, M.D.    Medications: Scheduled Meds:   . acetaminophen  1,000 mg Intravenous Q6H  . albuterol  2.5 mg Nebulization Q6H  . antiseptic oral rinse  15 mL Mouth Rinse BID  . aspirin EC  81 mg Oral Daily  . bupivacaine 0.75% in dextrose 8.25% (intrathecal)      .  ceFAZolin (ANCEF) IV  1 g Intravenous Once  . dexamethasone  4 mg Intravenous Once  . diltiazem  180 mg Oral Daily  . docusate sodium  100 mg Oral BID  . enoxaparin (LOVENOX) injection  40 mg Subcutaneous Q24H  . enoxaparin (LOVENOX) injection  1 mg/kg Subcutaneous Q12H  . fentaNYL      . gabapentin  100 mg Oral TID  . hydroxyurea  1,000 mg Oral Daily   And  . hydroxyurea  500 mg Oral QHS  . levothyroxine  25 mcg Oral Daily  . metoprolol succinate  100 mg Oral BID  . midazolam      . midazolam      . morphine   Intravenous Q4H  . ondansetron      . ondansetron (ZOFRAN) IV  4 mg Intravenous Once  . povidone-iodine   Topical Once  . propofol      . zolpidem  5 mg Oral QHS   Continuous Infusions:   . DISCONTD: lactated ringers 1,000 mL (11/19/11 1142)   PRN Meds:.acetaminophen, acetaminophen, diphenhydrAMINE, diphenhydrAMINE, HYDROmorphone, meclizine, metoprolol, naloxone, ondansetron (ZOFRAN) IV, ondansetron (ZOFRAN) IV, ondansetron (ZOFRAN) IV, ondansetron, oxyCODONE, promethazine, sodium chloride, DISCONTD: fentaNYL, DISCONTD: midazolam, DISCONTD: sodium chloride irrigation  Assessment/Plan:  Principal Problem:  *Hip fracture, right Active Problems:  Polycythemia vera  Chronic atrial fibrillation  Anticoagulant long-term use  HTN (hypertension)  Hypothyroidism  Plan:  1. Hip fracture/ fracture of left hand, s/p surgical repair of hip fracture, physical therapy .  2. Chronic Atrial Fib. Appears to be rate controlled. Currently on lovenox, will add coumadin  per pharmacy.   3. History of polycythemia vera, on hydroxyurea, stable   4. Hypothyroidism, stable   LOS: 4 days   Phill Steck [facility] Pager: [phone] 11/19/2011, 4:36 PM

## 2011-11-19 NOTE — Anesthesia Postprocedure Evaluation (Signed)
  Anesthesia Post-op Note  Patient: Charlotte [name]  Procedure(s) Performed: Procedure(s) (LRB): CANNULATED HIP PINNING (Right)  Patient Location: PACU  Anesthesia Type: Spinal  Level of Consciousness: awake, alert  and oriented  Airway and Oxygen Therapy: Patient Spontanous Breathing and Patient connected to face mask oxygen  Post-op Pain: none  Post-op Assessment: Post-op Vital signs reviewed, Patient's Cardiovascular Status Stable, Respiratory Function Stable, Patent Airway and No signs of Nausea or vomiting  Post-op Vital Signs: Reviewed and stable  Complications: No apparent anesthesia complications  T8

## 2011-11-19 NOTE — Transfer of Care (Signed)
Immediate Anesthesia Transfer of Care Note  Patient: Charlotte [name]  Procedure(s) Performed: Procedure(s) (LRB): CANNULATED HIP PINNING (Right)  Patient Location: PACU  Anesthesia Type: Spinal  Level of Consciousness: awake, alert  and oriented  Airway & Oxygen Therapy: Patient Spontanous Breathing and Patient connected to face mask oxygen  Post-op Assessment: Report given to PACU RN  Post vital signs: Reviewed and stable  Complications: No apparent anesthesia complications

## 2011-11-19 NOTE — Anesthesia Preprocedure Evaluation (Addendum)
Anesthesia Evaluation  Patient identified by MRN, date of birth, ID band Patient awake    Reviewed: Allergy & Precautions, H&P , NPO status , Patient's Chart, lab work & pertinent test results, reviewed documented beta blocker date and time   Airway Mallampati: I  Neck ROM: Full    Dental  (+) Edentulous Upper and Edentulous Lower   Pulmonary  breath sounds clear to auscultation        Cardiovascular hypertension, Pt. on medications + dysrhythmias Atrial Fibrillation Rhythm:Irregular Rate:Normal     Neuro/Psych  Neuromuscular disease    GI/Hepatic   Endo/Other  Hypothyroidism   Renal/GU      Musculoskeletal   Abdominal   Peds  Hematology  (+) Blood dyscrasia (polycythemia Vera), ,   Anesthesia Other Findings   Reproductive/Obstetrics                           Anesthesia Physical Anesthesia Plan  ASA: III  Anesthesia Plan: Spinal   Post-op Pain Management:    Induction:   Airway Management Planned: Nasal Cannula  Additional Equipment:   Intra-op Plan:   Post-operative Plan:   Informed Consent: I have reviewed the patients History and Physical, chart, labs and discussed the procedure including the risks, benefits and alternatives for the proposed anesthesia with the patient or authorized representative who has indicated his/her understanding and acceptance.     Plan Discussed with:   Anesthesia Plan Comments:         Anesthesia Quick Evaluation

## 2011-11-19 NOTE — Brief Op Note (Signed)
11/15/2011 - 11/19/2011  1:48 PM  PATIENT:  Charlotte [name]  71 y.o. female  PRE-OPERATIVE DIAGNOSIS:  right hip fracture femoral neck, [place] type II  POST-OPERATIVE DIAGNOSIS:  right hip fracture  PROCEDURE:  Procedure(s) (LRB): CANNULATED HIP PINNING (Right) in situ  SURGEON:  Surgeon(s) and Role:    * Sanjuana Kava, MD - Primary  PHYSICIAN ASSISTANT:   ASSISTANTS: none   ANESTHESIA:   spinal  EBL:  Total I/O In: 250 [I.V.:250] Out: 350 [Urine:300; Blood:50]  BLOOD ADMINISTERED:none  DRAINS: (Large) Hemovact drain(s) in the right hip with  Suction Open   LOCAL MEDICATIONS USED:  NONE  SPECIMEN:  No Specimen  DISPOSITION OF SPECIMEN:  N/A  COUNTS:  YES  TOURNIQUET:  * No tourniquets in log *  DICTATION: .Other Dictation: Dictation Number [id number]  PLAN OF CARE: Admit to inpatient   PATIENT DISPOSITION:  PACU - hemodynamically stable.   Delay start of Pharmacological VTE agent (>24hrs) due to surgical blood loss or risk of bleeding: no

## 2011-11-19 NOTE — Progress Notes (Addendum)
[place] for Lovenox  Indication: atrial fibrillation  No Known Allergies  Patient Measurements: Height: 6' (182.9 cm) Weight: 196 lb (88.905 kg) IBW/kg (Calculated) : 73.1   Vital Signs: Temp: 97.2 F (36.2 C) (04/17 1619) Temp src: Oral (04/17 1619) BP: 134/88 mmHg (04/17 1619) Pulse Rate: 102  (04/17 1619)  Labs:  Basename 11/19/11 0511 11/18/11 0900 11/17/11 0541  HGB 13.1 -- 12.4  HCT 38.7 -- 36.1  PLT 309 -- 264  APTT -- -- --  LABPROT -- 16.5* 19.8*  INR -- 1.31 1.65*  HEPARINUNFRC -- -- --  CREATININE 0.68 -- 0.72  CKTOTAL -- -- --  CKMB -- -- --  TROPONINI -- -- --   Estimated Creatinine Clearance: 83.2 ml/min (by C-G formula based on Cr of 0.68).  Medical History: Past Medical History  Diagnosis Date  . Hypertension   . Arthritis   . Varicose veins   . Deaf   . Elevated WBC count     and platelets  . Thyroid disease   . Irregular heart rate   . Polycythemia vera   . Peripheral neuropathy     feet  . Polycythemia vera 10/01/2011  . Atrial fibrillation, chronic    Medications:  Scheduled:     . acetaminophen  1,000 mg Intravenous Q6H  . albuterol  2.5 mg Nebulization Q6H  . antiseptic oral rinse  15 mL Mouth Rinse BID  . aspirin EC  81 mg Oral Daily  . bupivacaine 0.75% in dextrose 8.25% (intrathecal)      .  ceFAZolin (ANCEF) IV  1 g Intravenous Once  . dexamethasone  4 mg Intravenous Once  . diltiazem  180 mg Oral Daily  . docusate sodium  100 mg Oral BID  . enoxaparin (LOVENOX) injection  40 mg Subcutaneous Q24H  . enoxaparin (LOVENOX) injection  1 mg/kg Subcutaneous Q12H  . fentaNYL      . gabapentin  100 mg Oral TID  . hydroxyurea  1,000 mg Oral Daily   And  . hydroxyurea  500 mg Oral QHS  . levothyroxine  25 mcg Oral Daily  . metoprolol succinate  100 mg Oral BID  . midazolam      . midazolam      . morphine   Intravenous Q4H  . ondansetron      . ondansetron (ZOFRAN) IV  4 mg  Intravenous Once  . povidone-iodine   Topical Once  . propofol      . warfarin  5 mg Oral ONCE-1800  . Warfarin - Pharmacist Dosing Inpatient   Does not apply q1800  . zolpidem  5 mg Oral QHS   Assessment: Resume coumadin protocol today per MD [facility] for protocol  Goal of Therapy:  Lovenox full dose anticoagulation INR 2-3   Plan:  Lovenox 1.5mg /Kg daily until INR therapeutic Labs per protocol Coumadin 5mg  today x 1 INR daily until stable  Charlotte [name] 11/19/2011,5:08 PM

## 2011-11-19 NOTE — Anesthesia Procedure Notes (Signed)
Spinal  Patient location during procedure: OR Start time: 11/19/2011 1:03 PM Staffing CRNA/Resident: Tressie Stalker E Preanesthetic Checklist Completed: patient identified, site marked, surgical consent, pre-op evaluation, timeout performed, IV checked, risks and benefits discussed and monitors and equipment checked Spinal Block Patient position: right lateral decubitus Prep: Betadine Patient monitoring: heart rate, cardiac monitor, continuous pulse ox and blood pressure Approach: right paramedian Location: L3-4 Injection technique: single-shot Needle Needle type: Spinocan  Needle gauge: 22 G Needle length: 9 cm Assessment Sensory level: T8 Additional Notes ATTEMPTS:1 TRAY [id number] TRAY EXPIRATION DATE:02/2013

## 2011-11-19 NOTE — Progress Notes (Signed)
Patient has done well during the night.  She has not been in pain and has not had pain medication, although it was assessed and offered.  She did get the Betadine scrub to her right hip in preparation for the surgery and her Lovenox will be held this am.  The CHG bath will not be given until day shift as it needs to be given within 3 hours of surgery.  Spoke with patient about the surgery and stressed how important it was for her to get moving quickly after surgery in order to achieve a full recovery.  Encouraged her to use her incentive spirometer and to use a pillow to splint when she coughs, deep breathes, or sneezes.  She verbalized understanding of my instructions and I encouraged her to ask questions if she thought of any.

## 2011-11-20 LAB — DIFFERENTIAL
Basophils Relative: 0 % (ref 0–1)
Eosinophils Absolute: 0 10*3/uL (ref 0.0–0.7)
Eosinophils Relative: 0 % (ref 0–5)
Lymphs Abs: 0.6 10*3/uL — ABNORMAL LOW (ref 0.7–4.0)
Monocytes Absolute: 0.3 10*3/uL (ref 0.1–1.0)
Monocytes Relative: 6 % (ref 3–12)

## 2011-11-20 LAB — BASIC METABOLIC PANEL
BUN: 18 mg/dL (ref 6–23)
CO2: 25 mEq/L (ref 19–32)
GFR calc non Af Amer: 89 mL/min — ABNORMAL LOW (ref >90)
Glucose, Bld: 122 mg/dL — ABNORMAL HIGH (ref 70–99)
Potassium: 3.8 mEq/L (ref 3.5–5.1)
Sodium: 135 mEq/L (ref 135–145)

## 2011-11-20 LAB — CBC
Hemoglobin: 12.3 g/dL (ref 12.0–15.0)
MCH: 41.1 pg — ABNORMAL HIGH (ref 26.0–34.0)
MCHC: 34.5 g/dL (ref 30.0–36.0)
MCV: 119.4 fL — ABNORMAL HIGH (ref 78.0–100.0)
RBC: 2.99 MIL/uL — ABNORMAL LOW (ref 3.87–5.11)

## 2011-11-20 LAB — PROTIME-INR: INR: 1.23 (ref 0.00–1.49)

## 2011-11-20 MED ORDER — WARFARIN SODIUM 5 MG PO TABS
5.0000 mg | ORAL_TABLET | Freq: Once | ORAL | Status: AC
  Administered 2011-11-20: 5 mg via ORAL
  Filled 2011-11-20: qty 1

## 2011-11-20 MED ORDER — HYDROMORPHONE HCL PF 1 MG/ML IJ SOLN: 1.0000 mg | INTRAMUSCULAR | Status: DC | PRN

## 2011-11-20 NOTE — Addendum Note (Signed)
Addendum  created 11/20/11 0753 by Jule Economy, CRNA   Modules edited:Notes Section

## 2011-11-20 NOTE — Progress Notes (Signed)
   CARE MANAGEMENT NOTE 11/20/2011  Patient:  Charlotte [name], Charlotte [name]   Account Number:  [id number]  Date Initiated:  11/18/2011  Documentation initiated by:  Claretha Cooper  Subjective/Objective Assessment:   pt admitted 4/13 13 for fractured /r hip  after fall. Due to surgical schedule, was unable to schedule her untile 11/19/11. PTA lived at home with her husband at home.     Action/Plan:   Spoke with pt at bedside. After surgery we anticipate  HH and DME needs. Pt selected [place]. We will speak again after surgery to better assess [facility] DME needs   Anticipated DC Date:  11/22/2011   Anticipated DC Plan:  [place]  CM consult      Choice offered to / List presented to:     DME arranged  3-N-1  [place]      DME agency  [place] arranged  HH-1 RN  [place].   Status of service:  In process, will continue to follow Medicare Important Message given?   (If response is "NO", the following Medicare IM given date fields will be blank) Date Medicare IM given:   Date Additional Medicare IM given:    Discharge Disposition:    Per UR Regulation:    If discussed at Long Length of Stay Meetings, dates discussed:    Comments:  11/20/11 [place] BSN CM Pt doing great. Per PT, pt will need 3 in 1 and rolling walker with 5"wheels. [place] RN possibly needed for INR checks for levonox to coumadin bridge. Pt definitely wants to go home as soon as possible.  11/18/11 [place] BSN CM

## 2011-11-20 NOTE — Evaluation (Signed)
Physical Therapy Evaluation Patient Details Name: Charlotte [name] PATIENT MRN: [id number] DOB: 1941-01-21 Today's Date: 11/20/2011  Problem List:  Patient Active Problem List  Diagnoses  . Polycythemia vera  . Hip fracture, right  . Chronic atrial fibrillation  . Anticoagulant long-term use  . HTN (hypertension)  . Hypothyroidism    Past Medical History:  Past Medical History  Diagnosis Date  . Hypertension   . Arthritis   . Varicose veins   . Deaf   . Elevated WBC count     and platelets  . Thyroid disease   . Irregular heart rate   . Polycythemia vera   . Peripheral neuropathy     feet  . Polycythemia vera 10/01/2011  . Atrial fibrillation, chronic    Past Surgical History:  Past Surgical History  Procedure Date  . Blt   . Abdominal hysterectomy   . Coronary angioplasty   . Ct of abd     PT Assessment/Plan/Recommendation PT Assessment Clinical Impression Statement: Pt with new hip fx, highly motivated to increase mobility.  She  is able to move RLE independently in the bed with only min assist for transfers.  Able to ambulate 15' with walker, excellent gait pattern.  she should be able to transition to home at d/c with HHPT. PT Recommendation/Assessment: Patient will need skilled PT in the acute care venue PT Problem List: Decreased strength;Decreased range of motion;Decreased activity tolerance;Decreased mobility;Decreased knowledge of use of DME;Decreased knowledge of precautions;Pain PT Therapy Diagnosis : Difficulty walking;Abnormality of gait;Acute pain PT Plan PT Frequency: 7X/week PT Treatment/Interventions: DME instruction;Gait training;Stair training;Functional mobility training;Therapeutic exercise;Patient/family education PT Recommendation Recommendations for Other Services: OT consult Follow Up Recommendations: Home health PT Equipment Recommended: Rolling walker with 5" wheels;3 in 1 bedside comode PT Goals  Acute Rehab PT Goals PT Goal Formulation:  With patient Time For Goal Achievement: 7 days Pt will go Sit to Supine/Side: with modified independence PT Goal: Sit to Supine/Side - Progress: Goal set today Pt will Transfer Bed to Chair/Chair to Bed: with modified independence PT Transfer Goal: Bed to Chair/Chair to Bed - Progress: Goal set today Pt will Ambulate: 16 - 50 feet;with modified independence PT Goal: Ambulate - Progress: Goal set today Pt will Go Up / Down Stairs: 3-5 stairs;with rail(s);with min assist PT Goal: Up/Down Stairs - Progress: Goal set today  PT Evaluation Precautions/Restrictions  Precautions Precautions: Fall Restrictions Weight Bearing Restrictions: Yes RLE Weight Bearing: Touchdown weight bearing Prior Functioning  Home Living Lives With: Spouse;Son Available Help at Discharge: Family Type of Home: Mobile home Home Access: Stairs to enter Technical brewer of Steps: 4 Entrance Stairs-Rails: Right Home Layout: One level Bathroom Shower/Tub: Chiropodist: Standard Bathroom Accessibility: Yes How Accessible: Accessible via walker Home Adaptive Equipment: Straight cane Prior Function Level of Independence: Independent Able to Take Stairs?: Yes Driving: Yes Vocation: Retired Public librarian Arousal/Alertness: Awake/alert Overall Cognitive Status: Appears within functional limits for tasks assessed Orientation Level: Oriented X4 Sensation/Coordination Sensation Light Touch: Appears Intact Stereognosis: Not tested Hot/Cold: Not tested Proprioception: Appears Intact Coordination Gross Motor Movements are Fluid and Coordinated: Yes Fine Motor Movements are Fluid and Coordinated: Yes Extremity Assessment RUE Assessment RUE Assessment: Within Functional Limits LUE Assessment LUE Assessment: Within Functional Limits RLE Assessment RLE Assessment: Exceptions to [facility] RLE Strength RLE Overall Strength Comments: 3-/5 at hip LLE Assessment LLE Assessment: Within  Functional Limits Mobility (including Balance) Bed Mobility Bed Mobility: Yes Supine to Sit: 4: Min assist;HOB elevated (Comment degrees) (HOB at  40 deg) Supine to Sit Details (indicate cue type and reason): to assist RLE Sit to Supine: 4: Min assist Transfers Transfers: Yes Sit to Stand: 6: Modified independent (Device/Increase time) Stand to Sit: 6: Modified independent (Device/Increase time) Ambulation/Gait Ambulation/Gait: Yes Ambulation/Gait Assistance: 5: Supervision Ambulation/Gait Assistance Details (indicate cue type and reason): for gait technique Ambulation Distance (Feet): 15 Feet Assistive device: Rolling walker Gait Pattern: Decreased stance time - right;Antalgic Stairs: No Wheelchair Mobility Wheelchair Mobility: No  Posture/Postural Control Posture/Postural Control: No significant limitations Balance Balance Assessed: No Exercise  General Exercises - Lower Extremity Ankle Circles/Pumps: AROM;Both;10 reps;Supine Quad Sets: AROM;Both;10 reps;Supine Gluteal Sets: AROM;Both;10 reps;Supine Short Arc Quad: AAROM;Right;10 reps;Supine Heel Slides: AAROM;Right;10 reps;Supine Hip ABduction/ADduction: AAROM;Right;10 reps;Supine End of Session PT - End of Session Equipment Utilized During Treatment: Gait belt Activity Tolerance: Patient tolerated treatment well Patient left: in chair;with call bell in reach Nurse Communication: Mobility status for transfers;Mobility status for ambulation General Behavior During Session: [facility] for tasks performed Cognition: [facility] for tasks performed  Sable Feil 11/20/2011, 9:45 AM

## 2011-11-20 NOTE — Anesthesia Postprocedure Evaluation (Signed)
  Anesthesia Post-op Note  Patient: Charlotte [name]  Procedure(s) Performed: Procedure(s) (LRB): CANNULATED HIP PINNING (Right)  Patient Location: Room 307  Anesthesia Type: Spinal  Level of Consciousness: awake, alert , oriented and patient cooperative  Airway and Oxygen Therapy: Patient Spontanous Breathing and Patient connected to nasal cannula oxygen  Post-op Pain: mild  Post-op Assessment: Post-op Vital signs reviewed, Patient's Cardiovascular Status Stable, Respiratory Function Stable, Patent Airway and Pain level controlled  Post-op Vital Signs: Reviewed and stable  Complications: No apparent anesthesia complications

## 2011-11-20 NOTE — Progress Notes (Signed)
[place] for Lovenox and Warfarin Indication: atrial fibrillation, s/p hip surgery fx  No Known Allergies  Patient Measurements: Height: 6' (182.9 cm) Weight: 196 lb (88.905 kg) IBW/kg (Calculated) : 73.1   Vital Signs: Temp: 97.3 F (36.3 C) (04/18 0549) BP: 144/88 mmHg (04/18 0549) Pulse Rate: 93  (04/18 0549)  Labs:  Basename 11/20/11 0555 11/19/11 0511 11/18/11 0900  HGB 12.3 13.1 --  HCT 35.7* 38.7 --  PLT 355 309 --  APTT -- -- --  LABPROT 15.8* -- 16.5*  INR 1.23 -- 1.31  HEPARINUNFRC -- -- --  CREATININE 0.63 0.68 --  CKTOTAL -- -- --  CKMB -- -- --  TROPONINI -- -- --   Estimated Creatinine Clearance: 83.2 ml/min (by C-G formula based on Cr of 0.63).  Medical History: Past Medical History  Diagnosis Date  . Hypertension   . Arthritis   . Varicose veins   . Deaf   . Elevated WBC count     and platelets  . Thyroid disease   . Irregular heart rate   . Polycythemia vera   . Peripheral neuropathy     feet  . Polycythemia vera 10/01/2011  . Atrial fibrillation, chronic    Medications:  Scheduled:     . acetaminophen  1,000 mg Intravenous Q6H  . albuterol  2.5 mg Nebulization Q6H  . antiseptic oral rinse  15 mL Mouth Rinse BID  . aspirin EC  81 mg Oral Daily  . bupivacaine 0.75% in dextrose 8.25% (intrathecal)      .  ceFAZolin (ANCEF) IV  1 g Intravenous Once  . diltiazem  180 mg Oral Daily  . docusate sodium  100 mg Oral BID  . enoxaparin (LOVENOX) injection  140 mg Subcutaneous Q24H  . fentaNYL      . gabapentin  100 mg Oral TID  . hydroxyurea  1,000 mg Oral Daily   And  . hydroxyurea  500 mg Oral QHS  . levothyroxine  25 mcg Oral Daily  . metoprolol succinate  100 mg Oral BID  . midazolam      . midazolam      . morphine   Intravenous Q4H  . ondansetron      . propofol      . sodium chloride      . warfarin  5 mg Oral ONCE-1800  . Warfarin - Pharmacist Dosing Inpatient   Does not apply q1800  .  zolpidem  5 mg Oral QHS  . DISCONTD: enoxaparin (LOVENOX) injection  40 mg Subcutaneous Q24H  . DISCONTD: enoxaparin (LOVENOX) injection  1 mg/kg Subcutaneous Q12H   Assessment: OK to resume coumadin protocol per MD INR below goal and trending down  Goal of Therapy:  Lovenox full dose anticoagulation INR 2-3   Plan:  Lovenox 1.5mg /Kg daily until INR therapeutic Labs per protocol Coumadin 5mg  today x 1 INR daily until stable  Margrette Wynia A 11/20/2011,12:59 PM

## 2011-11-20 NOTE — Progress Notes (Signed)
Physical Therapy Treatment Patient Details Name: Charlotte [name] MRN: [id number] DOB: 11-12-1940 Today's Date: 11/20/2011  PT Assessment/Plan  PT - Assessment/Plan Comments on Treatment Session: Excellent tolerance to activity today.  Able to ambulate 25', TTWB R, with walker.  Doing well with ther ex and transfers.  Will plan to increase gait endurance tomorrow and instruct in gait on steps either tomorrow or Sat. PT Plan: Discharge plan remains appropriate;Frequency remains appropriate PT Frequency: 7X/week Recommendations for Other Services: OT consult Follow Up Recommendations: Home health PT Equipment Recommended: Rolling walker with 5" wheels;3 in 1 bedside comode PT Goals  Acute Rehab PT Goals PT Goal Formulation: With patient Time For Goal Achievement: 7 days Pt will go Sit to Supine/Side: with modified independence PT Goal: Sit to Supine/Side - Progress: Goal set today Pt will Transfer Bed to Chair/Chair to Bed: with modified independence PT Transfer Goal: Bed to Chair/Chair to Bed - Progress: Goal set today Pt will Ambulate: 16 - 50 feet;with modified independence PT Goal: Ambulate - Progress: Progressing toward goal Pt will Go Up / Down Stairs: 3-5 stairs;with rail(s);with min assist PT Goal: Up/Down Stairs - Progress: Goal set today  PT Treatment Precautions/Restrictions  Precautions Precautions: Fall Restrictions Weight Bearing Restrictions: Yes RLE Weight Bearing: Touchdown weight bearing Mobility (including Balance) Bed Mobility Bed Mobility: Yes Supine to Sit: 4: Min assist;HOB elevated (Comment degrees) (HOB at 40 deg) Supine to Sit Details (indicate cue type and reason): to assist RLE Sit to Supine: 4: Min assist Transfers Transfers: Yes Sit to Stand: From chair/3-in-1;With upper extremity assist;6: Modified independent (Device/Increase time) Stand to Sit: 6: Modified independent (Device/Increase time);To chair/3-in-1;Without upper extremity  assist Ambulation/Gait Ambulation/Gait: Yes Ambulation/Gait Assistance: 5: Supervision Ambulation/Gait Assistance Details (indicate cue type and reason): cues for increased thoracic extension Ambulation Distance (Feet): 25 Feet Assistive device: Rolling walker Gait Pattern: Decreased hip/knee flexion - right;Decreased stance time - right Gait velocity: WNL Stairs: No Wheelchair Mobility Wheelchair Mobility: No  Posture/Postural Control Posture/Postural Control: No significant limitations Balance Balance Assessed: No Exercise  General Exercises - Lower Extremity Ankle Circles/Pumps: AROM;Both;10 reps;Seated Quad Sets: AROM;Both;10 reps;Seated Gluteal Sets: AROM;Both;10 reps;Seated Short Arc Quad: AAROM;Right;10 reps;Supine Long Arc Quad: AROM;Right;10 reps;Seated Heel Slides: AAROM;Right;10 reps;Seated Hip ABduction/ADduction: AAROM;Right;10 reps;Seated End of Session PT - End of Session Equipment Utilized During Treatment: Gait belt Activity Tolerance: Patient tolerated treatment well Patient left: in chair (pt requests to sit in chair until after bath) Nurse Communication: Mobility status for transfers;Mobility status for ambulation General Behavior During Session: [facility] for tasks performed Cognition: [facility] for tasks performed  Sable Feil 11/20/2011, 1:05 PM

## 2011-11-20 NOTE — Progress Notes (Signed)
Subjective: 1 Day Post-Op Procedure(s) (LRB): CANNULATED HIP PINNING (Right) Patient reports pain as 2 on 0-10 scale.    Objective: Vital signs in last 24 hours: Temp:  [97.1 F (36.2 C)-98.3 F (36.8 C)] 97.3 F (36.3 C) (04/18 0549) Pulse Rate:  [91-106] 93  (04/18 0549) Resp:  [10-26] 14  (04/18 0549) BP: (102-168)/(59-113) 144/88 mmHg (04/18 0549) SpO2:  [90 %-98 %] 95 % (04/18 0734)  Intake/Output from previous day: 04/17 0701 - 04/18 0700 In: 1220 [P.O.:120; I.V.:1100] Out: 1750 [Urine:1700; Blood:50] Intake/Output this shift:     Basename 11/20/11 0555 11/19/11 0511  HGB 12.3 13.1    Basename 11/20/11 0555 11/19/11 0511  WBC 4.4 4.0  RBC 2.99* 3.20*  HCT 35.7* 38.7  PLT 355 309    Basename 11/20/11 0555 11/19/11 0511  NA 135 139  K 3.8 3.9  CL 99 104  CO2 25 25  BUN 18 13  CREATININE 0.63 0.68  GLUCOSE 122* 97  CALCIUM 9.6 9.7    Basename 11/20/11 0555 11/18/11 0900  LABPT -- --  INR 1.23 1.31    Neurologically intact Neurovascular intact Sensation intact distally Intact pulses distally Dorsiflexion/Plantar flexion intact  Assessment/Plan: 1 Day Post-Op Procedure(s) (LRB): CANNULATED HIP PINNING (Right) Up with therapy  Charlotte [name] 11/20/2011, 9:06 AM

## 2011-11-20 NOTE — Op Note (Signed)
NAMESHALU, JANKOWSKI             ACCOUNT NO.:  [id number]  MEDICAL RECORD NO.:  [id number]  LOCATION:  A307                          FACILITY:  [facility]  PHYSICIAN:  J. Sanjuana Kava, M.D. DATE OF BIRTH:  May 17, 1941  DATE OF PROCEDURE: DATE OF DISCHARGE:                              OPERATIVE REPORT   PREOPERATIVE DIAGNOSIS:  Femoral neck fracture, right hip, Garden type 2.  POSTOPERATIVE DIAGNOSIS:  Femoral neck fracture, right hip, Garden type 2.  PROCEDURE:  In situ pinning of cannulated screws of the right hip. Three parallel Asnis pins were placed measuring from 105 mm to 90 mm.  ANESTHESIA:  Spinal.  SURGEON:  J. Sanjuana Kava, MD.  DRAINS:  One large Hemovac drain.  ESTIMATED BLOOD LOSS:  Less than 50 mL.  INDICATIONS:  The patient is a 71 year old female who fell, injured her hip over the weekend.  She had a fractured to hip, however, she has been on Coumadin for other medical problems, who had to wait until INR and Protime that come back to normal,and I thought it would probably take until today and it has.  She had increased risk for being off the Coumadin, but we have to stop that.  She has femoral neck fracture.  I explained to her that the possibility of avascular necrosis can develop that happens at about 20-35% of the hips, even though pinned in situ and with minimal difficulty that she could have problems with the blood supply to the femoral head.  If that develops, she may have to have another hip procedures such as a bipolar hip prosthesis.  We will have to see how she does.  Should be limited weightbearing for a period of time also.  I have talked to her about the possibility of blood transfusion, possibility of infection, need for anesthesia and recommended spinal anesthesia.  I have also talked to her about post hospital care and the possibility of rehab or nursing home placement.  She appears to understand.  She has appropriate questions.  The  patient is seen in the holding area, and the right hip was identified as correct surgical site.  I have talked to the patient and her family.  I put a mark on the right hip.  She was brought back to the operating room, given spinal anesthesia and placed on the fracture table.  AP and lateral views ascertained to be satisfactory using C-arm fluoroscopy unit.  Prior to using the C-arm equipment, everyone had lead aprons and everyone had thyroid shields and x-ray bandages and the machine was working properly.  The patient was then prepped and draped in the usual manner.  We had a generalized time-out identifying the patient as Ms. Bechtel.  We are doing a right hip for femoral neck fracture.  All instrumentation was properly positioned and appeared to be working well.  OR team knew each other.  The C-arm fluoroscopy unit was working as stated.  Small incision was made laterally through skin and subcutaneous tissue, tensor fascia lata and vastus lateralis.  Guide pin was placed with good AP and lateral views and then 2 more parallel pins were then placed.  This measured from 105 to 90  mm in length. Cannulated screws were then inserted, appeared to look good on AP and lateral views and there were parallel to each other very nicely.  The guide pins have been removed.  Permanent x-rays were taken.  Hemovac drain was placed, sewn in with 2-0 silk.  The vastus lateralis reapproximated using a locking running #1 Bralon suture.  Tensor fascia lata reapproximated with a figure-of-eight #1 Bralon suture. Subcutaneous tissue was closed with 2-0 plain.  Skin reapproximated with skin staples.  Sterile dressing applied.  The patient tolerated the procedure well and will go to recovery in good condition.          ______________________________ Lenna Sciara. Sanjuana Kava, M.D.     JWK/MEDQ  D:  11/19/2011  T:  11/20/2011  Job:  [id number]

## 2011-11-20 NOTE — Progress Notes (Signed)
Subjective: Doing well, pain controlled, ambulated with walker  Objective: Vital signs in last 24 hours: Temp:  [97.1 F (36.2 C)-97.7 F (36.5 C)] 97.3 F (36.3 C) (04/18 0549) Pulse Rate:  [91-106] 93  (04/18 0549) Resp:  [10-21] 14  (04/18 1200) BP: (119-144)/(79-103) 144/88 mmHg (04/18 0549) SpO2:  [91 %-98 %] 97 % (04/18 1305) FiO2 (%):  [29 %] 29 % (04/18 0800) Weight change:  Last BM Date: 11/18/11  Intake/Output from previous day: 04/17 0701 - 04/18 0700 In: 1220 [P.O.:120; I.V.:1100] Out: 1750 [Urine:1700; Blood:50] Total I/O In: 120 [P.O.:120] Out: -    Physical Exam: General: Alert, awake, oriented x3, in no acute distress. HEENT: No bruits, no goiter. Heart: Regular rate and rhythm, without murmurs, rubs, gallops. Lungs: Clear to auscultation bilaterally. Abdomen: Soft, nontender, nondistended, positive bowel sounds. Extremities: No clubbing cyanosis or edema with positive pedal pulses. Neuro: Grossly intact, nonfocal.    Lab Results: Basic Metabolic Panel:  Basename 11/20/11 0555 11/19/11 0511  NA 135 139  K 3.8 3.9  CL 99 104  CO2 25 25  GLUCOSE 122* 97  BUN 18 13  CREATININE 0.63 0.68  CALCIUM 9.6 9.7  MG -- --  PHOS -- --   Liver Function Tests: No results found for this basename: AST:2,ALT:2,ALKPHOS:2,BILITOT:2,PROT:2,ALBUMIN:2 in the last 72 hours No results found for this basename: LIPASE:2,AMYLASE:2 in the last 72 hours No results found for this basename: AMMONIA:2 in the last 72 hours CBC:  Basename 11/20/11 0555 11/19/11 0511  WBC 4.4 4.0  NEUTROABS 3.5 --  HGB 12.3 13.1  HCT 35.7* 38.7  MCV 119.4* 120.9*  PLT 355 309   Cardiac Enzymes: No results found for this basename: CKTOTAL:3,CKMB:3,CKMBINDEX:3,TROPONINI:3 in the last 72 hours BNP: No results found for this basename: PROBNP:3 in the last 72 hours D-Dimer: No results found for this basename: DDIMER:2 in the last 72 hours CBG: No results found for this basename:  GLUCAP:6 in the last 72 hours Hemoglobin A1C: No results found for this basename: HGBA1C in the last 72 hours Fasting Lipid Panel: No results found for this basename: CHOL,HDL,LDLCALC,TRIG,CHOLHDL,LDLDIRECT in the last 72 hours Thyroid Function Tests: No results found for this basename: TSH,T4TOTAL,FREET4,T3FREE,THYROIDAB in the last 72 hours Anemia Panel: No results found for this basename: VITAMINB12,FOLATE,FERRITIN,TIBC,IRON,RETICCTPCT in the last 72 hours Coagulation:  Basename 11/20/11 0555 11/18/11 0900  LABPROT 15.8* 16.5*  INR 1.23 1.31   Urine Drug Screen: Drugs of Abuse  No results found for this basename: labopia, cocainscrnur, labbenz, amphetmu, thcu, labbarb    Alcohol Level: No results found for this basename: ETH:2 in the last 72 hours Urinalysis: No results found for this basename: COLORURINE:2,APPERANCEUR:2,LABSPEC:2,PHURINE:2,GLUCOSEU:2,HGBUR:2,BILIRUBINUR:2,KETONESUR:2,PROTEINUR:2,UROBILINOGEN:2,NITRITE:2,LEUKOCYTESUR:2 in the last 72 hours  Recent Results (from the past 240 hour(s))  URINE CULTURE     Status: Normal   Collection Time   11/15/11  5:24 PM      Component Value Range Status Comment   Specimen Description URINE, CATHETERIZED   Final    Special Requests NONE   Final    Culture  Setup Time [id number]   Final    Colony Count NO GROWTH   Final    Culture NO GROWTH   Final    Report Status 11/17/2011 FINAL   Final   SURGICAL PCR SCREEN     Status: Normal   Collection Time   11/18/11  3:03 PM      Component Value Range Status Comment   MRSA, PCR NEGATIVE  NEGATIVE  Final  Staphylococcus aureus NEGATIVE  NEGATIVE  Final     Studies/Results: Dg Hip Operative Right  11/19/2011  *RADIOLOGY REPORT*  Clinical Data: Fracture fixation.  OPERATIVE RIGHT HIP  Comparison: Plain films 11/15/2011.  Findings: We are provided three fluoroscopic intraoperative spot views of the right hip.  Images demonstrate placement of three pins for fixation of a subcapital  right hip fracture.  No evidence of complication.  IMPRESSION: ORIF right hip fracture.  Original Report Authenticated By: Arvid Right. Luther Parody, M.D.    Medications: Scheduled Meds:   . acetaminophen  1,000 mg Intravenous Q6H  . albuterol  2.5 mg Nebulization Q6H  . antiseptic oral rinse  15 mL Mouth Rinse BID  . aspirin EC  81 mg Oral Daily  . bupivacaine 0.75% in dextrose 8.25% (intrathecal)      .  ceFAZolin (ANCEF) IV  1 g Intravenous Once  . diltiazem  180 mg Oral Daily  . docusate sodium  100 mg Oral BID  . enoxaparin (LOVENOX) injection  140 mg Subcutaneous Q24H  . fentaNYL      . gabapentin  100 mg Oral TID  . hydroxyurea  1,000 mg Oral Daily   And  . hydroxyurea  500 mg Oral QHS  . levothyroxine  25 mcg Oral Daily  . metoprolol succinate  100 mg Oral BID  . midazolam      . midazolam      . morphine   Intravenous Q4H  . ondansetron      . propofol      . sodium chloride      . warfarin  5 mg Oral ONCE-1800  . warfarin  5 mg Oral ONCE-1800  . Warfarin - Pharmacist Dosing Inpatient   Does not apply q1800  . zolpidem  5 mg Oral QHS  . DISCONTD: enoxaparin (LOVENOX) injection  40 mg Subcutaneous Q24H  . DISCONTD: enoxaparin (LOVENOX) injection  1 mg/kg Subcutaneous Q12H   Continuous Infusions:   . DISCONTD: lactated ringers 1,000 mL (11/19/11 1142)   PRN Meds:.acetaminophen, acetaminophen, diphenhydrAMINE, diphenhydrAMINE, HYDROmorphone, meclizine, metoprolol, naloxone, ondansetron (ZOFRAN) IV, ondansetron (ZOFRAN) IV, ondansetron (ZOFRAN) IV, ondansetron, oxyCODONE, promethazine, sodium chloride, DISCONTD: fentaNYL, DISCONTD: midazolam  Assessment/Plan:  Principal Problem:  *Hip fracture, right Active Problems:  Polycythemia vera  Chronic atrial fibrillation  Anticoagulant long-term use  HTN (hypertension)  Hypothyroidism  Plan: 1. Hip fracture/ fracture of left hand, s/p surgical repair of hip fracture, plans are for home health PT .  2. Chronic Atrial  Fib. Appears to be rate controlled. Currently on lovenox, and coumadin, will likely need to be bridged at home.  3. History of polycythemia vera, on hydroxyurea, stable   4. Hypothyroidism, stable  5. Macrocytosis.  Check b12 and folate level  6. Dispo.  Plans are to discharge home with HHPT when cleared by ortho.  ?tomorrow?   LOS: 5 days   Saylor Sheckler [facility] Pager: [phone] 11/20/2011, 2:15 PM

## 2011-11-21 LAB — CBC
Hemoglobin: 12.1 g/dL (ref 12.0–15.0)
MCH: 41.4 pg — ABNORMAL HIGH (ref 26.0–34.0)
MCHC: 34.3 g/dL (ref 30.0–36.0)
RDW: 16.1 % — ABNORMAL HIGH (ref 11.5–15.5)

## 2011-11-21 LAB — BASIC METABOLIC PANEL
BUN: 20 mg/dL (ref 6–23)
Calcium: 9.6 mg/dL (ref 8.4–10.5)
Creatinine, Ser: 0.68 mg/dL (ref 0.50–1.10)
GFR calc Af Amer: >90 mL/min (ref >90)
GFR calc non Af Amer: 87 mL/min — ABNORMAL LOW (ref >90)
Glucose, Bld: 92 mg/dL (ref 70–99)

## 2011-11-21 LAB — FOLATE RBC: RBC Folate: 812 ng/mL — ABNORMAL HIGH (ref >=366)

## 2011-11-21 LAB — DIFFERENTIAL
Basophils Absolute: 0 10*3/uL (ref 0.0–0.1)
Basophils Relative: 0 % (ref 0–1)
Eosinophils Absolute: 0 10*3/uL (ref 0.0–0.7)
Monocytes Relative: 3 % (ref 3–12)
Neutrophils Relative %: 62 % (ref 43–77)

## 2011-11-21 LAB — PROTIME-INR: INR: 1.32 (ref 0.00–1.49)

## 2011-11-21 MED ORDER — ENOXAPARIN (LOVENOX) PATIENT EDUCATION KIT
PACK | Freq: Once | Status: DC
  Filled 2011-11-21: qty 1

## 2011-11-21 MED ORDER — OXYCODONE HCL 5 MG PO TABS: 5.0000 mg | ORAL_TABLET | ORAL | Status: AC | PRN

## 2011-11-21 MED ORDER — ENOXAPARIN SODIUM 100 MG/ML ~~LOC~~ SOLN: 140.0000 mg | SUBCUTANEOUS | Status: DC

## 2011-11-21 MED ORDER — WARFARIN SODIUM 5 MG PO TABS: 5.0000 mg | ORAL_TABLET | Freq: Once | ORAL | Status: DC

## 2011-11-21 NOTE — Progress Notes (Signed)
Patient discharge by Bo Merino, RN.

## 2011-11-21 NOTE — Progress Notes (Signed)
   CARE MANAGEMENT NOTE 11/21/2011  Patient:  Charlotte [name], Charlotte [name]   Account Number:  [id number]  Date Initiated:  11/18/2011  Documentation initiated by:  Claretha Cooper  Subjective/Objective Assessment:   pt admitted 4/13 13 for fractured /r hip  after fall. Due to surgical schedule, was unable to schedule her untile 11/19/11. PTA lived at home with her husband at home.     Action/Plan:   Spoke with pt at bedside. After surgery we anticipate  HH and DME needs. Pt selected [facility]. We will speak again after surgery to better assess [facility] DME needs   Anticipated DC Date:  11/22/2011   Anticipated DC Plan:  [place]  CM consult      Choice offered to / List presented to:     DME arranged  3-N-1  [facility]ass[facility] Moselle      DME agency  [place] arranged  HH-1 RN  [place].   Status of service:  Completed, signed off Medicare Important Message given?   (If response is "NO", the following Medicare IM given date fields will be blank) Date Medicare IM given:   Date Additional Medicare IM given:    Discharge Disposition:  [place]  Per UR Regulation:    If discussed at Long Length of Stay Meetings, dates discussed:    Comments:  11/20/11 [place] BSN CM Pt doing great. Per PT, pt will need 3 in 1 and rolling walker with 5"wheels. [place] RN possibly needed for INR checks for levonox to coumadin bridge. Pt definitely wants to go home as soon as possible.  11/18/11 [place] BSN CM

## 2011-11-21 NOTE — Progress Notes (Signed)
Physician Discharge Summary  Patient ID: Charlotte [name] MRN: [id number] DOB/AGE: 10/12/1940 71 y.o.  Admit date: 11/15/2011 Discharge date: 11/21/2011  Primary Care Physician:  [name], MD, MD   Discharge Diagnoses:    Principal Problem:  *Hip fracture, right Active Problems:  Polycythemia vera  Chronic atrial fibrillation  Anticoagulant long-term use  HTN (hypertension)  Hypothyroidism Mechanical Fall   Medication List  As of 11/21/2011 12:35 PM   STOP taking these medications         meclizine 25 MG tablet         TAKE these medications         aspirin EC 81 MG tablet   Take 81 mg by mouth daily.      diltiazem 180 MG 24 hr capsule   Commonly known as: TIAZAC   Take 180 mg by mouth daily.      enoxaparin 100 MG/ML injection   Commonly known as: LOVENOX   Inject 1.4 mLs (140 mg total) into the skin daily.      Fish Oil 1200 MG Caps   Take 1 capsule by mouth 3 (three) times daily.      gabapentin 100 MG capsule   Commonly known as: NEURONTIN   Take 100 mg by mouth 3 (three) times daily.      hydroxyurea 500 MG capsule   Commonly known as: HYDREA   Taking 2 pills each morning and 1 pill each evening.      levothyroxine 25 MCG tablet   Commonly known as: SYNTHROID, LEVOTHROID   Take 25 mcg by mouth daily.      metoCLOPramide 10 MG tablet   Commonly known as: REGLAN   Take 1 tablet (10 mg total) by mouth every 6 (six) hours as needed (nausea).      metoprolol succinate 100 MG 24 hr tablet   Commonly known as: TOPROL-XL   Take 100 mg by mouth 2 (two) times daily.      oxyCODONE 5 MG immediate release tablet   Commonly known as: Oxy IR/ROXICODONE   Take 1 tablet (5 mg total) by mouth every 4 (four) hours as needed.      warfarin 5 MG tablet   Commonly known as: COUMADIN   Take 5 mg by mouth daily.           Discharge Exam: Blood pressure 146/90, pulse 69, temperature 97.7 F (36.5 C), temperature source Oral, resp. rate 20, height  6' (1.829 m), weight 88.905 kg (196 lb), SpO2 92.00%. NAD CTA B S1,S2, RRR Soft, NT, BS+ No edema b/l  Disposition and Follow-up:  Discharge home today Home health PT/RN Follow up with Dr. Luna Glasgow in 1 month Follow up with PCP in 2 weeks INR in 3 days with results sent to Dr. Debara Pickett at [facility]  Consults:  Orthopedics, Dr. Luna Glasgow   Significant Diagnostic Studies:  Dg Hip Operative Right  11/19/2011  *RADIOLOGY REPORT*  Clinical Data: Fracture fixation.  OPERATIVE RIGHT HIP  Comparison: Plain films 11/15/2011.  Findings: We are provided three fluoroscopic intraoperative spot views of the right hip.  Images demonstrate placement of three pins for fixation of a subcapital right hip fracture.  No evidence of complication.  IMPRESSION: ORIF right hip fracture.  Original Report Authenticated By: Arvid Right. D'ALESSIO, M.D.    Brief H and P: For complete details please refer to admission H and P, but in brief This is a 71 year old, Caucasian female, with a past medical history of chronic atrial fibrillation  on anticoagulation, hypertension, history of vertigo, polycythemia vera, who was in her usual state of health earlier today when she missed a step at her house and fell. She used her left arm to block the fall. As a result she fell on her right hip, as well as injured her left hand and left wrist area. She did not hit her head. Did not have any syncopal episodes. No chest pain. She denies any dizziness or lightheadedness prior to the fall. Pain is reasonably well controlled at this time. She is extremely hard of hearing. She denies any chest pain with exertion at home. She's had the stress test, and cardiac catheterization in the past according to the patient, but doesn't have any stents.   Hospital Course:  This lady was admitted to the hospital after suffering a mechanical fall and resultant hip fracture. She is on coumadin for atrial fibrillation.  Her INR was reversed for surgery.  She underwent  operative repair by Dr. Luna Glasgow and tolerated very well.  She is ambulating with a walker and is felt safe to discharge home today.  She has been restarted on coumadin with lovenox bridge which will be continued at home.  She will follow up with Dr. Luna Glasgow in one month. The remainder of her chronic medical issues have remained stable.  Time spent on Discharge: 53mins  Signed: An Lannan [facility] Pager: [phone] 11/21/2011, 12:35 PM

## 2011-11-21 NOTE — Progress Notes (Signed)
Subjective: 2 Days Post-Op Procedure(s) (LRB): CANNULATED HIP PINNING (Right) Patient reports pain as 1 on 0-10 scale.    Objective: Vital signs in last 24 hours: Temp:  [97.3 F (36.3 C)-97.7 F (36.5 C)] 97.7 F (36.5 C) (04/19 0539) Pulse Rate:  [69] 69  (04/18 1400) Resp:  [14-20] 20  (04/19 0539) BP: (100-146)/(54-90) 146/90 mmHg (04/19 0539) SpO2:  [92 %-97 %] 92 % (04/19 0539) FiO2 (%):  [29 %] 29 % (04/18 0800)  Intake/Output from previous day: 04/18 0701 - 04/19 0700 In: 480 [P.O.:480] Out: 2350 [Urine:2350] Intake/Output this shift:     Basename 11/21/11 0541 11/20/11 0555 11/19/11 0511  HGB 12.1 12.3 13.1    Basename 11/21/11 0541 11/20/11 0555  WBC 4.1 4.4  RBC 2.92* 2.99*  HCT 35.3* 35.7*  PLT 375 355    Basename 11/21/11 0541 11/20/11 0555  NA 139 135  K 3.8 3.8  CL 104 99  CO2 26 25  BUN 20 18  CREATININE 0.68 0.63  GLUCOSE 92 122*  CALCIUM 9.6 9.6    Basename 11/21/11 0541 11/20/11 0555  LABPT -- --  INR 1.32 1.23    Neurologically intact Neurovascular intact Sensation intact distally Intact pulses distally Dorsiflexion/Plantar flexion intact Incision: scant drainage No cellulitis present Compartment soft  Assessment/Plan: 2 Days Post-Op Procedure(s) (LRB): CANNULATED HIP PINNING (Right) Discharge home with home health when medicine feels appropriate for anti-coagulation reasons.  She is doing excellent.  She is up in chair independent.  She has good ROM of the right hip just two days after surgery.  She has little pain.  She can go home to home health.  I will need to see in one month in office.  Staples to be removed by home health April 28 or 29th.  She will need to be on the enoxaparin until she achieves desired INR level with the Coumadin.    She will need elevated toilet seat also.  Charlotte [name] 11/21/2011, 7:40 AM

## 2011-11-21 NOTE — Progress Notes (Signed)
[facility] for Lovenox and Warfarin Indication: atrial fibrillation, s/p hip surgery fx  No Known Allergies  Patient Measurements: Height: 6' (182.9 cm) Weight: 196 lb (88.905 kg) IBW/kg (Calculated) : 73.1   Vital Signs: Temp: 97.7 F (36.5 C) (04/19 0539) Temp src: Oral (04/19 0539) BP: 146/90 mmHg (04/19 0539)  Labs:  Basename 11/21/11 0541 11/20/11 0555 11/19/11 0511  HGB 12.1 12.3 --  HCT 35.3* 35.7* 38.7  PLT 375 355 309  APTT -- -- --  LABPROT 16.6* 15.8* --  INR 1.32 1.23 --  HEPARINUNFRC -- -- --  CREATININE 0.68 0.63 0.68  CKTOTAL -- -- --  CKMB -- -- --  TROPONINI -- -- --   Estimated Creatinine Clearance: 83.2 ml/min (by C-G formula based on Cr of 0.68).  Medical History: Past Medical History  Diagnosis Date  . Hypertension   . Arthritis   . Varicose veins   . Deaf   . Elevated WBC count     and platelets  . Thyroid disease   . Irregular heart rate   . Polycythemia vera   . Peripheral neuropathy     feet  . Polycythemia vera 10/01/2011  . Atrial fibrillation, chronic    Medications:  Scheduled:     . acetaminophen  1,000 mg Intravenous Q6H  . albuterol  2.5 mg Nebulization Q6H  . antiseptic oral rinse  15 mL Mouth Rinse BID  . aspirin EC  81 mg Oral Daily  .  ceFAZolin (ANCEF) IV  1 g Intravenous Once  . diltiazem  180 mg Oral Daily  . docusate sodium  100 mg Oral BID  . enoxaparin (LOVENOX) injection  140 mg Subcutaneous Q24H  . gabapentin  100 mg Oral TID  . hydroxyurea  1,000 mg Oral Daily   And  . hydroxyurea  500 mg Oral QHS  . levothyroxine  25 mcg Oral Daily  . metoprolol succinate  100 mg Oral BID  . warfarin  5 mg Oral ONCE-1800  . warfarin  5 mg Oral ONCE-1800  . Warfarin - Pharmacist Dosing Inpatient   Does not apply q1800  . zolpidem  5 mg Oral QHS  . DISCONTD: morphine   Intravenous Q4H   Assessment: OK to resume coumadin protocol per MD INR below goal   Goal of Therapy:    Lovenox full dose anticoagulation INR 2-3   Plan:  Lovenox 1.5mg /Kg daily until INR therapeutic Labs per protocol Coumadin 5mg  today x 1 INR daily until stable  Charlotte [name] A 11/21/2011,11:37 AM

## 2011-11-21 NOTE — Discharge Instructions (Signed)
Hip Fracture (Upper Femoral Fracture) You have a hip fracture (break in bone). This is a fracture of the upper part of the big bone (femur, thigh bone) between your hip and knee. If your caregiver feels it is a stable fracture, occasionally it can be treated without surgery. Usually these fractures are unstable. This means that the bones will not heal properly without surgery. Surgery is necessary to hold the bones together in a good position where they will heal well. DIAGNOSIS A physical exam can determine if a fracture has occurred. X-ray studies are needed to see what type of fracture is present and to look for other injuries. These studies will help your caregiver determine what the best treatment is for you. If there is more than one option, your caregiver can give you the information needed to help you decide on the treatment. TREATMENT  The treatment for an unstable fracture is usually surgery. This means using a screw, nail, or rod to hold the bones in place.  RISKS AND COMPLICATIONS All surgery is associated with risks. Sometimes the implant may fail. Other complications of surgery include infection or the bones not healing properly. Sometimes the fracture may damage the blood supply to the head of the femur. That portion of bone may die (osteonecrosis or avascular necrosis). Sometimes to avoid this complication, an implant is used which just replaces the ball of the femur (hemi-arthroplasty or prosthetic replacement). Some of the other risks are:  Excessive bleeding.   Infection.   Dislocation if a hemi-arthroplasty or a total hip was inserted.   Failure to heal properly resulting in an unstable hip.   Stiffness of hip following repair.   On occasion, blood may have to be replaced before or during the procedure  LET YOUR CAREGIVERS KNOW ABOUT:  Allergies.   Medications taken including herbs, eye drops, over the counter medications, and creams.   Use of steroids (by mouth or  creams).   History of bleeding or blood problems.   History of serious infection.   Previous problems with anesthetics or novocaine.   Possibility of pregnancy, if this applies.   History of blood clots (thrombophlebitis).   Previous surgery.   Other health problems.  BEFORE THE PROCEDURE Before surgery, an IV (intravenous line connected to your vein) may be started. You will be given an anesthetic (medications and gas to make you sleep) or given medications in your back to make you numb from the waist down (spinal anesthetic). AFTER THE PROCEDURE After surgery, you will be taken to the recovery area where a nurse will watch your progress. You may have a catheter (a long, narrow, hollow tube) in your bladder that helps you pass your water. Once you're awake, stable, and taking fluids well, you will be returned to your room. You will receive physical therapy and other care until you are doing well and your caregiver feels it is safe for you to be transferred either to home or to an extended care facility. Your activity level will change as your caregiver determines what is best for you.  You may resume normal diet and activities as directed or allowed.   Change dressings if necessary or as directed.   Only take over-the-counter or prescription medicines for pain, discomfort, or fever as directed by your caregiver.   You may be placed on blood thinners for 4-6 weeks to prevent blood clots.  SEEK IMMEDIATE MEDICAL CARE IF:  There is swelling of your calf or leg.   You  have shortness of breath or chest pain.   There is redness, swelling, or increasing pain in the wound.   There is pus coming from wound.   You have an unexplained oral temperature above 102 F (38.9 C).   There is a foul (bad) smell coming from the wound or dressing.   There is a breaking open of the wound (edges not staying together) after sutures or staples have been removed.   There is a marked increase in  pain or shortening of the leg.   You have severe pain anywhere in the leg.   There is any change in color or temperature of your leg below the injury.  MAKE SURE YOU:   Understand these instructions.   Will watch your condition.   Will get help right away if you are not doing well or get worse.  Document Released: 07/21/2005 Document Revised: 07/10/2011 Document Reviewed: 03/10/2008 [facility] Patient Information 2012 [place].Cast or Splint Care Casts and splints support injured limbs and keep bones from moving while they heal.  HOME CARE  Keep the cast or splint uncovered during the drying period.   A plaster cast can take 24 to 48 hours to dry.   A fiberglass cast will dry in less than 1 hour.   Do not rest the cast on anything harder than a pillow for 24 hours.   Do not put weight on your injured limb. Do not put pressure on the cast. Wait for your doctor's approval.   Keep the cast or splint dry.   Cover the cast or splint with a plastic bag during baths or wet weather.   If you have a cast over your chest and belly (trunk), take sponge baths until the cast is taken off.   Keep your cast or splint clean. Wash a dirty cast with a damp cloth.   Do not put any objects under your cast or splint. Do not scratch the skin under the cast with an object.   Do not take out the padding from inside your cast.   Exercise your joints near the cast as told by your doctor.   Raise (elevate) your injured limb on 1 or 2 pillows for the first 1 to 3 days.  GET HELP RIGHT AWAY IF:  Your cast or splint cracks.   Your cast or splint is too tight or too loose.   You itch badly under the cast.   Your cast gets wet or has a soft spot.   You have a bad smell coming from the cast.   You get an object stuck under the cast.   Your skin around the cast becomes red or raw.   You have new or more pain after the cast is put on.   You have fluid leaking through the cast.   You  cannot move your fingers or toes.   Your fingers or toes turn colors or are cool, painful, or puffy (swollen).   You have tingling or lose feeling (numbness) around the injured area.   You have pain or pressure under the cast.   You have trouble breathing or have shortness of breath.   You have chest pain.  MAKE SURE YOU:  Understand these instructions.   Will watch your condition.   Will get help right away if you are not doing well or get worse.  Document Released: 11/20/2010 Document Revised: 07/10/2011 Document Reviewed: 11/20/2010 [facility] Patient Information 2012 [place].

## 2011-11-21 NOTE — Plan of Care (Signed)
Problem: Phase I Progression Outcomes Goal: Post op clear liquids, advance diet as tolerated Outcome: Completed/Met Date Met:  11/21/11 Regular diet, tolerating well.

## 2011-11-21 NOTE — Progress Notes (Addendum)
Physical Therapy Treatment Patient Details Name: Charlotte [name] MRN: [id number] DOB: 11-16-1940 Today's Date: 11/21/2011 Time: [id number] PT Time Calculation (min): 27 min  Patient continues to improve gait distance today and completed stair training;supervision with all activities, minimal cueing (15%)  needed for sequencing on stairs.  PT Assessment / Plan / Recommendation Clinical Impression       Follow Up Recommendations       Equipment Recommendations       Frequency      Precautions / Restrictions     Pertinent Vitals/Pain     Mobility  Transfers Sit to Stand: 6: Modified independent (Device/Increase time);From chair/3-in-1;With upper extremity assist Stand to Sit: 6: Modified independent (Device/Increase time) Ambulation/Gait Ambulation/Gait Assistance: 5: Supervision Ambulation Distance (Feet): 120 Feet (80' + 40') Assistive device: Rolling walker Stairs: Yes Stairs Assistance: 5: Supervision Stairs Assistance Details (indicate cue type and reason): verbal cues for sequencing Stair Management Technique: One rail Right;With walker Number of Stairs: 2     Exercises General Exercises - Lower Extremity Long Arc Quad: Both;20 reps Other Exercises Other Exercises: sit<>stand x5   PT Goals Acute Rehab PT Goals PT Goal: Ambulate - Progress: Progressing toward goal PT Goal: Up/Down Stairs - Progress: Progressing toward goal  Visit Information  Last PT Received On: 11/21/11    Subjective Data      Cognition       Balance     End of Session @[id number]    Kamalani Mastro, [place] 11/21/2011, 11:19 AM

## 2011-11-22 DIAGNOSIS — G609 Hereditary and idiopathic neuropathy, unspecified: Secondary | ICD-10-CM | POA: Diagnosis not present

## 2011-11-22 DIAGNOSIS — Z7901 Long term (current) use of anticoagulants: Secondary | ICD-10-CM | POA: Diagnosis not present

## 2011-11-22 DIAGNOSIS — I10 Essential (primary) hypertension: Secondary | ICD-10-CM | POA: Diagnosis not present

## 2011-11-22 DIAGNOSIS — I4891 Unspecified atrial fibrillation: Secondary | ICD-10-CM | POA: Diagnosis not present

## 2011-11-22 DIAGNOSIS — S7290XD Unspecified fracture of unspecified femur, subsequent encounter for closed fracture with routine healing: Secondary | ICD-10-CM | POA: Diagnosis not present

## 2011-11-22 DIAGNOSIS — Z5181 Encounter for therapeutic drug level monitoring: Secondary | ICD-10-CM | POA: Diagnosis not present

## 2011-11-22 LAB — TYPE AND SCREEN
ABO/RH(D): A POS
Antibody Screen: NEGATIVE
Unit division: 0
Unit division: 0

## 2011-11-24 DIAGNOSIS — Z5181 Encounter for therapeutic drug level monitoring: Secondary | ICD-10-CM | POA: Diagnosis not present

## 2011-11-24 DIAGNOSIS — I10 Essential (primary) hypertension: Secondary | ICD-10-CM | POA: Diagnosis not present

## 2011-11-24 DIAGNOSIS — S7290XD Unspecified fracture of unspecified femur, subsequent encounter for closed fracture with routine healing: Secondary | ICD-10-CM | POA: Diagnosis not present

## 2011-11-24 DIAGNOSIS — Z7901 Long term (current) use of anticoagulants: Secondary | ICD-10-CM | POA: Diagnosis not present

## 2011-11-24 DIAGNOSIS — I4891 Unspecified atrial fibrillation: Secondary | ICD-10-CM | POA: Diagnosis not present

## 2011-11-24 DIAGNOSIS — G609 Hereditary and idiopathic neuropathy, unspecified: Secondary | ICD-10-CM | POA: Diagnosis not present

## 2011-11-26 DIAGNOSIS — G609 Hereditary and idiopathic neuropathy, unspecified: Secondary | ICD-10-CM | POA: Diagnosis not present

## 2011-11-26 DIAGNOSIS — I10 Essential (primary) hypertension: Secondary | ICD-10-CM | POA: Diagnosis not present

## 2011-11-26 DIAGNOSIS — S7290XD Unspecified fracture of unspecified femur, subsequent encounter for closed fracture with routine healing: Secondary | ICD-10-CM | POA: Diagnosis not present

## 2011-11-26 DIAGNOSIS — Z7901 Long term (current) use of anticoagulants: Secondary | ICD-10-CM | POA: Diagnosis not present

## 2011-11-26 DIAGNOSIS — I4891 Unspecified atrial fibrillation: Secondary | ICD-10-CM | POA: Diagnosis not present

## 2011-11-26 DIAGNOSIS — Z5181 Encounter for therapeutic drug level monitoring: Secondary | ICD-10-CM | POA: Diagnosis not present

## 2011-11-27 DIAGNOSIS — Z7901 Long term (current) use of anticoagulants: Secondary | ICD-10-CM | POA: Diagnosis not present

## 2011-11-27 DIAGNOSIS — S7290XD Unspecified fracture of unspecified femur, subsequent encounter for closed fracture with routine healing: Secondary | ICD-10-CM | POA: Diagnosis not present

## 2011-11-27 DIAGNOSIS — I10 Essential (primary) hypertension: Secondary | ICD-10-CM | POA: Diagnosis not present

## 2011-11-27 DIAGNOSIS — I4891 Unspecified atrial fibrillation: Secondary | ICD-10-CM | POA: Diagnosis not present

## 2011-11-27 DIAGNOSIS — Z5181 Encounter for therapeutic drug level monitoring: Secondary | ICD-10-CM | POA: Diagnosis not present

## 2011-11-27 DIAGNOSIS — G609 Hereditary and idiopathic neuropathy, unspecified: Secondary | ICD-10-CM | POA: Diagnosis not present

## 2011-11-28 ENCOUNTER — Emergency Department (HOSPITAL_COMMUNITY): Payer: Medicare Other

## 2011-11-28 ENCOUNTER — Inpatient Hospital Stay (HOSPITAL_COMMUNITY)
Admission: EM | Admit: 2011-11-28 | Discharge: 2011-12-08 | DRG: 500 | Disposition: A | Discharge status: Home Health | Payer: Medicare Other | Attending: Orthopedic Surgery | Admitting: Orthopedic Surgery

## 2011-11-28 ENCOUNTER — Encounter (HOSPITAL_COMMUNITY): Payer: Self-pay

## 2011-11-28 DIAGNOSIS — IMO0002 Reserved for concepts with insufficient information to code with codable children: Secondary | ICD-10-CM | POA: Diagnosis not present

## 2011-11-28 DIAGNOSIS — G8918 Other acute postprocedural pain: Secondary | ICD-10-CM

## 2011-11-28 DIAGNOSIS — M79609 Pain in unspecified limb: Secondary | ICD-10-CM | POA: Diagnosis not present

## 2011-11-28 DIAGNOSIS — S7010XA Contusion of unspecified thigh, initial encounter: Secondary | ICD-10-CM | POA: Diagnosis not present

## 2011-11-28 DIAGNOSIS — E876 Hypokalemia: Secondary | ICD-10-CM | POA: Diagnosis present

## 2011-11-28 DIAGNOSIS — M79A29 Nontraumatic compartment syndrome of unspecified lower extremity: Secondary | ICD-10-CM | POA: Clinically undetermined

## 2011-11-28 DIAGNOSIS — I5033 Acute on chronic diastolic (congestive) heart failure: Secondary | ICD-10-CM | POA: Diagnosis present

## 2011-11-28 DIAGNOSIS — M799 Soft tissue disorder, unspecified: Secondary | ICD-10-CM | POA: Diagnosis not present

## 2011-11-28 DIAGNOSIS — D45 Polycythemia vera: Secondary | ICD-10-CM | POA: Diagnosis present

## 2011-11-28 DIAGNOSIS — D62 Acute posthemorrhagic anemia: Secondary | ICD-10-CM | POA: Diagnosis not present

## 2011-11-28 DIAGNOSIS — R609 Edema, unspecified: Secondary | ICD-10-CM | POA: Diagnosis not present

## 2011-11-28 DIAGNOSIS — M79651 Pain in right thigh: Secondary | ICD-10-CM | POA: Diagnosis present

## 2011-11-28 DIAGNOSIS — M7981 Nontraumatic hematoma of soft tissue: Secondary | ICD-10-CM | POA: Diagnosis present

## 2011-11-28 DIAGNOSIS — M66259 Spontaneous rupture of extensor tendons, unspecified thigh: Principal | ICD-10-CM | POA: Diagnosis present

## 2011-11-28 DIAGNOSIS — I4891 Unspecified atrial fibrillation: Secondary | ICD-10-CM | POA: Diagnosis present

## 2011-11-28 DIAGNOSIS — R52 Pain, unspecified: Secondary | ICD-10-CM | POA: Diagnosis not present

## 2011-11-28 DIAGNOSIS — I509 Heart failure, unspecified: Secondary | ICD-10-CM | POA: Diagnosis present

## 2011-11-28 DIAGNOSIS — M62838 Other muscle spasm: Secondary | ICD-10-CM | POA: Diagnosis present

## 2011-11-28 DIAGNOSIS — Z7901 Long term (current) use of anticoagulants: Secondary | ICD-10-CM

## 2011-11-28 DIAGNOSIS — I10 Essential (primary) hypertension: Secondary | ICD-10-CM | POA: Diagnosis present

## 2011-11-28 DIAGNOSIS — M25551 Pain in right hip: Secondary | ICD-10-CM

## 2011-11-28 DIAGNOSIS — M25559 Pain in unspecified hip: Secondary | ICD-10-CM | POA: Diagnosis not present

## 2011-11-28 DIAGNOSIS — M7989 Other specified soft tissue disorders: Secondary | ICD-10-CM | POA: Diagnosis not present

## 2011-11-28 LAB — URINALYSIS, ROUTINE W REFLEX MICROSCOPIC
Bilirubin Urine: NEGATIVE
Glucose, UA: NEGATIVE mg/dL
Hgb urine dipstick: NEGATIVE
Specific Gravity, Urine: 1.02 (ref 1.005–1.030)
pH: 6.5 (ref 5.0–8.0)

## 2011-11-28 LAB — CBC
MCH: 41.6 pg — ABNORMAL HIGH (ref 26.0–34.0)
MCV: 122.6 fL — ABNORMAL HIGH (ref 78.0–100.0)
Platelets: 477 10*3/uL — ABNORMAL HIGH (ref 150–400)
RBC: 2.74 MIL/uL — ABNORMAL LOW (ref 3.87–5.11)

## 2011-11-28 LAB — BASIC METABOLIC PANEL
BUN: 20 mg/dL (ref 6–23)
Calcium: 10.3 mg/dL (ref 8.4–10.5)
Creatinine, Ser: 0.83 mg/dL (ref 0.50–1.10)
GFR calc non Af Amer: 70 mL/min — ABNORMAL LOW (ref >90)
Glucose, Bld: 118 mg/dL — ABNORMAL HIGH (ref 70–99)
Sodium: 136 mEq/L (ref 135–145)

## 2011-11-28 LAB — DIFFERENTIAL
Eosinophils Absolute: 0 10*3/uL (ref 0.0–0.7)
Eosinophils Relative: 0 % (ref 0–5)
Lymphs Abs: 0.9 10*3/uL (ref 0.7–4.0)

## 2011-11-28 LAB — PROTIME-INR: Prothrombin Time: 24.3 seconds — ABNORMAL HIGH (ref 11.6–15.2)

## 2011-11-28 LAB — CK: Total CK: 163 U/L (ref 7–177)

## 2011-11-28 MED ORDER — HYDROMORPHONE HCL PF 1 MG/ML IJ SOLN
0.5000 mg | Freq: Once | INTRAMUSCULAR | Status: AC
  Administered 2011-11-28: 0.5 mg via INTRAVENOUS

## 2011-11-28 MED ORDER — SODIUM CHLORIDE 0.9 % IV SOLN
INTRAVENOUS | Status: DC
  Administered 2011-11-28: 09:00:00 via INTRAVENOUS

## 2011-11-28 MED ORDER — DILTIAZEM HCL ER COATED BEADS 180 MG PO CP24
180.0000 mg | ORAL_CAPSULE | Freq: Every day | ORAL | Status: DC
  Administered 2011-11-28 – 2011-12-03 (×6): 180 mg via ORAL
  Filled 2011-11-28 (×6): qty 1

## 2011-11-28 MED ORDER — METOCLOPRAMIDE HCL 10 MG PO TABS: 10.0000 mg | ORAL_TABLET | Freq: Four times a day (QID) | ORAL | Status: DC | PRN

## 2011-11-28 MED ORDER — TEMAZEPAM 15 MG PO CAPS
15.0000 mg | ORAL_CAPSULE | Freq: Every evening | ORAL | Status: DC | PRN
  Administered 2011-11-28: 15 mg via ORAL
  Filled 2011-11-28: qty 1

## 2011-11-28 MED ORDER — HYDROMORPHONE HCL PF 1 MG/ML IJ SOLN
0.5000 mg | INTRAMUSCULAR | Status: DC | PRN
  Administered 2011-11-28 – 2011-11-29 (×3): 1 mg via INTRAVENOUS
  Filled 2011-11-28 (×3): qty 1

## 2011-11-28 MED ORDER — METOPROLOL SUCCINATE ER 50 MG PO TB24
100.0000 mg | ORAL_TABLET | Freq: Two times a day (BID) | ORAL | Status: DC
  Administered 2011-11-28 – 2011-11-30 (×5): 100 mg via ORAL
  Filled 2011-11-28: qty 1
  Filled 2011-11-28 (×4): qty 2
  Filled 2011-11-28: qty 1

## 2011-11-28 MED ORDER — SODIUM CHLORIDE 0.9 % IV SOLN: INTRAVENOUS | Status: DC

## 2011-11-28 MED ORDER — SODIUM CHLORIDE 0.9 % IV SOLN
INTRAVENOUS | Status: DC
  Administered 2011-11-28: 950 mL via INTRAVENOUS
  Administered 2011-11-29: 10:00:00 via INTRAVENOUS

## 2011-11-28 MED ORDER — GABAPENTIN 100 MG PO CAPS
100.0000 mg | ORAL_CAPSULE | Freq: Three times a day (TID) | ORAL | Status: DC
  Administered 2011-11-28 – 2011-12-08 (×29): 100 mg via ORAL
  Filled 2011-11-28 (×35): qty 1

## 2011-11-28 MED ORDER — OXYCODONE HCL 5 MG PO TABS
5.0000 mg | ORAL_TABLET | ORAL | Status: DC | PRN
  Administered 2011-11-28 – 2011-11-29 (×2): 5 mg via ORAL
  Filled 2011-11-28 (×2): qty 1

## 2011-11-28 MED ORDER — ONDANSETRON HCL 4 MG/2ML IJ SOLN
4.0000 mg | Freq: Once | INTRAMUSCULAR | Status: AC
  Administered 2011-11-28 – 2011-11-29 (×2): 4 mg via INTRAVENOUS
  Filled 2011-11-28: qty 2

## 2011-11-28 MED ORDER — HYDROMORPHONE HCL PF 1 MG/ML IJ SOLN
1.0000 mg | INTRAMUSCULAR | Status: DC | PRN
  Administered 2011-11-28 (×2): 1 mg via INTRAVENOUS
  Filled 2011-11-28 (×3): qty 1

## 2011-11-28 MED ORDER — ONDANSETRON 8 MG PO TBDP
8.0000 mg | ORAL_TABLET | Freq: Once | ORAL | Status: AC
  Administered 2011-11-28: 8 mg via ORAL
  Filled 2011-11-28: qty 1

## 2011-11-28 MED ORDER — MAGNESIUM HYDROXIDE 400 MG/5ML PO SUSP: 30.0000 mL | Freq: Every day | ORAL | Status: DC | PRN

## 2011-11-28 MED ORDER — ONDANSETRON HCL 4 MG/2ML IJ SOLN
4.0000 mg | Freq: Once | INTRAMUSCULAR | Status: AC
  Administered 2011-11-28: 4 mg via INTRAVENOUS
  Filled 2011-11-28: qty 2

## 2011-11-28 MED ORDER — DIPHENHYDRAMINE HCL 50 MG/ML IJ SOLN
25.0000 mg | Freq: Once | INTRAMUSCULAR | Status: AC
  Administered 2011-11-28: 25 mg via INTRAVENOUS
  Filled 2011-11-28: qty 1

## 2011-11-28 MED ORDER — HYDROXYUREA 500 MG PO CAPS
500.0000 mg | ORAL_CAPSULE | Freq: Every day | ORAL | Status: DC
  Administered 2011-11-28 – 2011-12-07 (×10): 500 mg via ORAL
  Filled 2011-11-28 (×13): qty 1

## 2011-11-28 MED ORDER — HYDROMORPHONE HCL PF 1 MG/ML IJ SOLN
1.0000 mg | INTRAMUSCULAR | Status: DC | PRN
  Administered 2011-11-28: 1 mg via INTRAMUSCULAR
  Filled 2011-11-28: qty 1

## 2011-11-28 MED ORDER — ACETAMINOPHEN 500 MG PO TABS
500.0000 mg | ORAL_TABLET | Freq: Once | ORAL | Status: AC
  Administered 2011-11-28: 500 mg via ORAL
  Filled 2011-11-28: qty 1

## 2011-11-28 MED ORDER — HYDROXYUREA 500 MG PO CAPS
1000.0000 mg | ORAL_CAPSULE | Freq: Every day | ORAL | Status: DC
  Administered 2011-11-29 – 2011-12-08 (×10): 1000 mg via ORAL
  Filled 2011-11-28 (×7): qty 2
  Filled 2011-11-28: qty 1
  Filled 2011-11-28: qty 2

## 2011-11-28 MED ORDER — SODIUM CHLORIDE 0.9 % IV BOLUS (SEPSIS)
1000.0000 mL | Freq: Once | INTRAVENOUS | Status: AC
  Administered 2011-11-28: 1000 mL via INTRAVENOUS

## 2011-11-28 MED ORDER — HYDROXYUREA 500 MG PO CAPS: 500.0000 mg | ORAL_CAPSULE | Freq: Two times a day (BID) | ORAL | Status: DC

## 2011-11-28 MED ORDER — LEVOTHYROXINE SODIUM 25 MCG PO TABS
25.0000 ug | ORAL_TABLET | Freq: Every day | ORAL | Status: DC
  Administered 2011-11-28 – 2011-12-08 (×10): 25 ug via ORAL
  Filled 2011-11-28 (×11): qty 1

## 2011-11-28 MED ORDER — HYDROCODONE-ACETAMINOPHEN 5-325 MG PO TABS
1.0000 | ORAL_TABLET | Freq: Once | ORAL | Status: AC
  Administered 2011-11-28: 1 via ORAL
  Filled 2011-11-28: qty 1

## 2011-11-28 MED ORDER — HYDROMORPHONE HCL PF 1 MG/ML IJ SOLN
1.0000 mg | Freq: Once | INTRAMUSCULAR | Status: AC
Start: 2011-11-28 — End: 2011-11-28
  Administered 2011-11-28: 1 mg via INTRAVENOUS
  Filled 2011-11-28: qty 1

## 2011-11-28 MED ORDER — HYDROMORPHONE HCL PF 1 MG/ML IJ SOLN: 1.0000 mg | INTRAMUSCULAR | Status: DC | PRN

## 2011-11-28 NOTE — Progress Notes (Signed)
Patient ID: Charlotte [name], female   DOB: [birth year], 71 y.o.   MRN: [id number] Chief Complaint  Patient presents with  . Hip Pain   Reexamination right thigh  Medications Dilaudid and oxycodone  The patient has less tenderness than she had in the emergency room. The tension in the anterior compartment is less. The thigh is still swollen.  Continue present management using ice and pain medication monitoring amount of medication given and if increasing medication needed. Patient is awake alert and can cooperate with examination.  Impending compartment syndrome continue monitoring and examination

## 2011-11-28 NOTE — ED Provider Notes (Addendum)
History    This chart was scribed for Charlena Cross, MD, MD by Rhae Lerner. The patient was seen in room APA05 and the patient's care was started at 8:23AM.   CSN: [id number]  Arrival date & time 11/28/11  0711   First MD Initiated Contact with Patient 11/28/11 [phone]      Chief Complaint  Patient presents with  . Hip Pain    (Consider location/radiation/quality/duration/timing/severity/associated sxs/prior treatment) The history is provided by the patient, medical records and the spouse.   Charlotte [name] is a 71 y.o. female who presents to the Emergency Department BIB EMS complaining of severe right hip/thigh pain. Pt had surgery on April 13th following a traumatic right hip fracture, procedure performed by Dr. Luna Glasgow. The patient was working with PT yesterday and did multiple exercises, notably working on stairs/stairclimbing. Pt reports pain started gradually worsening after the exercises and became moderate to more severe in intensity later that night around 9PM. Pt rates pain at 9/10. The pain has been constant without radiation, localized to the right hip, right thigh laterally, medially, and anteriorly, described as aching, worsened by flexion of the right hip, extension of the right knee, or palpation of the right upper leg/hip.  Ecchymosis apparent at the proximal/lateral, and mid-distal/medial thigh with moderately severe tenderness to palpation, swelling of the upper leg, and muscle compartments that feel moderately tense on palpation compared to the left upper leg. Pt took pain medication pta without relief and is having difficulty experiencing adequate relief of pain in the ED with repeated doses of narcotics.  Pain improved at best to 5/10 lasting less than an hour before worsening again. Dr. Celedonio Miyamoto is surgeon  Past Medical History  Diagnosis Date  . Hypertension   . Arthritis   . Varicose veins   . Deaf   . Elevated WBC count     and platelets  . Thyroid disease     . Irregular heart rate   . Polycythemia vera   . Peripheral neuropathy     feet  . Polycythemia vera 10/01/2011  . Atrial fibrillation, chronic     Past Surgical History  Procedure Date  . Blt   . Abdominal hysterectomy   . Coronary angioplasty   . Ct of abd     Family History  Problem Relation Age of Onset  . Diabetes Son     History  Substance Use Topics  . Smoking status: Never Smoker   . Smokeless tobacco: Not on file  . Alcohol Use: No    OB History    Grav Para Term Preterm Abortions TAB SAB Ect Mult Living                  Review of Systems  All other systems reviewed and are negative.   10 Systems reviewed and all are negative for acute change except as noted in the HPI.   Allergies  Review of patient's allergies indicates no known allergies.  Home Medications   Current Outpatient Rx  Name Route Sig Dispense Refill  . ASPIRIN EC 81 MG PO TBEC Oral Take 81 mg by mouth daily.    Marland Kitchen DILTIAZEM HCL ER BEADS 180 MG PO CP24 Oral Take 180 mg by mouth daily.    Marland Kitchen ENOXAPARIN SODIUM 100 MG/ML [place] SOLN Subcutaneous Inject 1.4 mLs (140 mg total) into the skin daily. 10 Syringe 0  . GABAPENTIN 100 MG PO CAPS Oral Take 100 mg by mouth 3 (three) times daily.      Marland Kitchen  HYDROXYUREA 500 MG PO CAPS  Taking 2 pills each morning and 1 pill each evening. 90 capsule 3  . LEVOTHYROXINE SODIUM 25 MCG PO TABS Oral Take 25 mcg by mouth daily.      Marland Kitchen METOCLOPRAMIDE HCL 10 MG PO TABS Oral Take 1 tablet (10 mg total) by mouth every 6 (six) hours as needed (nausea). 30 tablet 0  . METOPROLOL SUCCINATE ER 100 MG PO TB24 Oral Take 100 mg by mouth 2 (two) times daily.      Marland Kitchen FISH OIL 1200 MG PO CAPS Oral Take 1 capsule by mouth 3 (three) times daily.     . OXYCODONE HCL 5 MG PO TABS Oral Take 1 tablet (5 mg total) by mouth every 4 (four) hours as needed. 30 tablet 0  . WARFARIN SODIUM 5 MG PO TABS Oral Take 5 mg by mouth daily.        There were no vitals taken for this  visit.  Physical Exam  Nursing note and vitals reviewed. Constitutional: She is oriented to person, place, and time. She appears well-developed and well-nourished. She appears distressed.  HENT:  Head: Normocephalic and atraumatic.  Eyes: EOM are normal. Pupils are equal, round, and reactive to light.  Neck: Normal range of motion. Neck supple. No JVD present.  Cardiovascular: Normal rate, regular rhythm, normal heart sounds and intact distal pulses.  Exam reveals no gallop and no friction rub.   No murmur heard.      Distal dorsalis pedis and posterior tibialis pulses are full to palpation at the right foot.  Capillary refill at the toes is less than 3 seconds.  No pallor, no dusky appearance to suggest perfusion defecit to distal rle.  No palpable cord or significant tenderness/swelling/tension of muscular compartment of the right calf on palpation.  Pulmonary/Chest: Effort normal and breath sounds normal. No respiratory distress. She has no wheezes. She has no rales.  Abdominal: Soft. Bowel sounds are normal. She exhibits no distension. There is no tenderness.  Musculoskeletal: She exhibits tenderness.       Lateral right hip and proximal lateral right thigh tenderness  7 cm linear surgical incision at lateral and proximal right thigh without suppuration, dehiscence, erythema or other evident local would complication.  Right ant./lat./medial muscular compartment tenderness, moderately firm compared to contralateral limb.  Right thigh appears enlarged compared to left.  Mild to moderate swelling in right knee with no ecchymosis.  Mild Tenderness at bilateral right knee joint-line without other deformity or instability. Good dp and good pt pulse Distal right thigh and proximal lateral right thigh Movement is intact distally toes warm and well perfused  capillary refill about 2 seconds  Inversion and eversion of right hip  Mild tension of muscle compartment of right thigh but not suggestive of  compartment syndrome  Pelvis is stable   Neurological: She is alert and oriented to person, place, and time. No cranial nerve deficit. She exhibits normal muscle tone.       The patient reports pre-existent peripheral neuropathy at lower legs that is unchanged.  No new numbness/paresthesia reported but diminished light touch sensation at bilateral lower extremities.  Skin: Skin is warm and dry. No rash noted. She is not diaphoretic. No erythema. No pallor.       Ecchymosis at proximal/lateral right thigh and mid-distal medial right thigh.  Psychiatric: She has a normal mood and affect. Her behavior is normal.    ED Course  Procedures (including critical care time)  COORDINATION  OF CARE: 8:34AM EDP discusses pt Ed treatment course with pt.  8:34AM EDP ordered medication: 0.96% NaCl infusion, dilaudid 1-2 mg, zofran 8 mg 12:27PM RECHECK EDP discusses pt's lab results with pt. Pt reports pain 9/10. EDP orders more pain medication.     Labs Reviewed  PROTIME-INR - Abnormal; Notable for the following:    Prothrombin Time 24.3 (*)    INR 2.14 (*)    All other components within normal limits  APTT - Abnormal; Notable for the following:    aPTT 57 (*)    All other components within normal limits  CBC - Abnormal; Notable for the following:    RBC 2.74 (*)    Hemoglobin 11.4 (*)    HCT 33.6 (*)    MCV 122.6 (*)    MCH 41.6 (*)    RDW 15.9 (*)    Platelets 477 (*)    All other components within normal limits  DIFFERENTIAL - Abnormal; Notable for the following:    Neutrophils Relative 81 (*)    All other components within normal limits  BASIC METABOLIC PANEL - Abnormal; Notable for the following:    Glucose, Bld 118 (*)    GFR calc non Af Amer 70 (*)    GFR calc Af Amer 82 (*)    All other components within normal limits  LACTIC ACID, PLASMA - Abnormal; Notable for the following:    Lactic Acid, Venous 2.6 (*)    All other components within normal limits  KETONES, URINE  CK    URINALYSIS, ROUTINE W REFLEX MICROSCOPIC   Dg Pelvis 1-2 Views  11/28/2011  *RADIOLOGY REPORT*  Clinical Data: Status post ORIF for right femoral neck fracture.  RIGHT HIP - COMPLETE 2+ VIEW  Comparison: 11/19/2011 and 11/16/2011.  Findings: Cannulated compression screw is identified in the right femoral neck, as before.  No evidence for hardware complication. The head of the most caudal screw has not been included on the film.  SI joints and symphysis pubis are unremarkable.  IMPRESSION: No complicating features identified on this film although the inferior most aspect of the orthopedic hardware has not been visualized.  Per CMS PQRS reporting requirements (PQRS Measure 24): Given the patient's age of greater than 18 and the fracture site (hip, distal radius, or spine), the patient should be tested for osteoporosis using DXA, and the appropriate treatment considered based on the DXA results.  Original Report Authenticated By: ERIC A. MANSELL, M.D.   Dg Femur Right  11/28/2011  *RADIOLOGY REPORT*  Clinical Data: Status post ORIF for hip fracture.  RIGHT FEMUR - 2 VIEW  Comparison: 11/19/2011.  Findings: The patient has had pain placement for right femoral neck fracture.  No evidence for hardware complications.  IMPRESSION: Status post ORIF for right femoral neck fracture.  No evidence for hardware complications.  Original Report Authenticated By: ERIC A. MANSELL, M.D.   US Venous Img Lower Unilateral Right  11/28/2011  *RADIOLOGY REPORT*  Clinical Data: Status post ORIF for right femoral neck fracture. Now with right lower extremity pain and swelling.  RIGHT LOWER EXTREMITY VENOUS DUPLEX ULTRASOUND  Technique: Gray-scale sonography with graded compression, as well as color Doppler and duplex ultrasound, were performed to evaluate the deep venous system of the right lower extremity from the level of the common femoral vein through the popliteal and proximal calf veins. Spectral Doppler was utilized to  evaluate flow at rest and with distal augmentation maneuvers.  Comparison: No comparison studies available.  Findings: The  right common femoral vein, superficial femoral vein, deep femoral vein, saphenofemoral junction, and popliteal vein show normal patent directional flow, normal phasicity, normal augmentation, and normal compression.  Flow signal is seen within the posterior tibial and peroneal veins where visualized below the knee.  IMPRESSION: No evidence for DVT in the right lower extremity.  Original Report Authenticated By: ERIC A. MANSELL, M.D.   [place] Extrem Low Right Ltd  11/28/2011  *RADIOLOGY REPORT*  Clinical Data: Bruising to right medial thigh, evaluate for hematoma status post right hip surgery  ULTRASOUND RIGHT LOWER EXTREMITY LIMITED  Technique:  Ultrasound examination of the region of interest in the right lower extremity was performed.  Comparison:  None.  Findings: Limited evaluation of the area of clinical concern (medial right thigh) demonstrates mild subcutaneous edema without organized fluid collection, abscess, or hematoma.  IMPRESSION: No hematoma is seen at the site of clinical concern (medial right thigh).  Original Report Authenticated By: Julian Hy, M.D.   Studies and lab results reviewed by me.  No apparent post-operative complications of fracture, hardware migration/failure/displacement, significant intra-muscular hematoma, or DVT.  No apparent rhabdomyolysis.  Physical exam does not indicate impaired circulation to the distal lower extremity at this time.  The patient is requiring what is for her, a disproportionate amount of analgesia with insufficient relief concerning for compartment syndrome that I worry may be due to intramuscular bleeding due to anticoagulation despite no apparent hematoma on ultrasound.   No diagnosis found.  CRITICAL CARE Performed by: Charlena Cross   Total critical care time: 60 minutes  Critical care time was exclusive of separately  billable procedures and treating other patients.  Critical care was necessary to treat or prevent imminent or life-threatening deterioration.  Critical care was time spent personally by me on the following activities: development of treatment plan with patient and/or surrogate as well as nursing, discussions with consultants, evaluation of patient's response to treatment, examination of patient, obtaining history from patient or surrogate, ordering and performing treatments and interventions, ordering and review of laboratory studies, ordering and review of radiographic studies, pulse oximetry and re-evaluation of patient's condition.   MDM  Hip fractue, displacement of surgical hardware, muscle strain, intramuscular hematoma, compartment syndrome, DVT, postoperative pain, rhabdomyolysis, tissue ischemia/limb ischemia considered among other potential etiologies in the patient's differential diagnosis.  If compartment syndrome is developing, it would be in early stages at this time, despite the lower liklihood that this complication would arise this late after injury/surgery and after the less invasive/traumatic nature of the surgical repair, and cannot be fully excluded in the relatively short time of an ED presentation and evaluation. In my opinion, her symptoms, examination, and ED course warrants emergent orthopedic evaluation and further observation as an inpatient.    Dr. Luna Glasgow was contacted and informed of the patient's presentation, symptoms, and findings and my concern regarding possible developing compartment syndrome, and the possibility of subsequent tissue ischemia that could result and her need for orthopedic consultation and observation as an inpatient.  He agreed that compartment syndrome would typically be less likely at this time in her postoperative course, but that orthopedic evaluation was justified.  He will not be available however, and he refers me to on-call orthopedist Dr.  Aline Brochure.  The above concerns were discussed with Dr. Aline Brochure who agrees to bring the patient into the hospital for observation, evaluation, pain control, and further management by him.     I personally performed the services described in this documentation, which was scribed  in my presence. The recorded information has been reviewed and considered.    Charlena Cross, MD 11/29/11 [id number]  Charlena Cross, MD 11/29/11 [id number]  Charlena Cross, MD 11/29/11 [phone]

## 2011-11-28 NOTE — H&P (Signed)
Charlotte [name] is an 71 y.o. female.   Chief Complaint: right thigh pain  HPI:  Hip Pain   (Consider location/radiation/quality/duration/timing/severity/associated sxs/prior  treatment)  The history is provided by the patient.  Charlotte [name] is a 71 y.o. female who presents to the Emergency Department By [facility] complaining of moderate right hip pain. Pt had surgery on April 17th. Pt was at PT 1 day ago and did multiple exercises. Pt reports pain started after the exercises later that night around 9PM. Pt rates pain at 9/10. The pain has been constant without radiation. Pt took pain medication pta without relief.  Dr. Celedonio Miyamoto is surgeon   Past Medical History  Diagnosis Date  . Hypertension   . Arthritis   . Varicose veins   . Deaf   . Elevated WBC count     and platelets  . Thyroid disease   . Irregular heart rate   . Polycythemia vera   . Peripheral neuropathy     feet  . Polycythemia vera 10/01/2011  . Atrial fibrillation, chronic     Past Surgical History  Procedure Date  . Blt   . Abdominal hysterectomy   . Coronary angioplasty   . Ct of abd   . Hip pinning,cannulated 11/19/2011    Procedure: CANNULATED HIP PINNING;  Surgeon: Sanjuana Kava, MD;  Location: [facility];  Service: Orthopedics;  Laterality: Right;    Family History  Problem Relation Age of Onset  . Diabetes Son    Social History:  reports that she has never smoked. She does not have any smokeless tobacco history on file. She reports that she does not drink alcohol or use illicit drugs.  Allergies: No Known Allergies  Medications Prior to Admission  Medication Sig Dispense Refill  . aspirin EC 81 MG tablet Take 81 mg by mouth daily.      Marland Kitchen diltiazem (TIAZAC) 180 MG 24 hr capsule Take 180 mg by mouth daily.      Marland Kitchen enoxaparin (LOVENOX) 100 MG/ML injection Inject 1.4 mLs (140 mg total) into the skin daily.  10 Syringe  0  . gabapentin (NEURONTIN) 100 MG capsule Take 100 mg by mouth 3 (three) times daily.         . hydroxyurea (HYDREA) 500 MG capsule Take 500-1,000 mg by mouth 2 (two) times daily. Take 2 capsules each morning and 1 capsule each evening.      Marland Kitchen levothyroxine (SYNTHROID, LEVOTHROID) 25 MCG tablet Take 25 mcg by mouth daily.        . metoprolol (TOPROL-XL) 100 MG 24 hr tablet Take 100 mg by mouth 2 (two) times daily.        Marland Kitchen oxyCODONE (OXY IR/ROXICODONE) 5 MG immediate release tablet Take 1 tablet (5 mg total) by mouth every 4 (four) hours as needed.  30 tablet  0  . warfarin (COUMADIN) 5 MG tablet Take 5 mg by mouth daily.       . metoCLOPramide (REGLAN) 10 MG tablet Take 1 tablet (10 mg total) by mouth every 6 (six) hours as needed (nausea).  30 tablet  0    Results for orders placed during the hospital encounter of 11/28/11 (from the past 48 hour(s))  PROTIME-INR     Status: Abnormal   Collection Time   11/28/11  8:37 AM      Component Value Range Comment   Prothrombin Time 24.3 (*) 11.6 - 15.2 (seconds)    INR 2.14 (*) 0.00 - 1.49    APTT  Status: Abnormal   Collection Time   11/28/11  8:37 AM      Component Value Range Comment   aPTT 57 (*) 24 - 37 (seconds)   CBC     Status: Abnormal   Collection Time   11/28/11  8:37 AM      Component Value Range Comment   WBC 6.2  4.0 - 10.5 (K/uL)    RBC 2.74 (*) 3.87 - 5.11 (MIL/uL)    Hemoglobin 11.4 (*) 12.0 - 15.0 (g/dL)    HCT 33.6 (*) 36.0 - 46.0 (%)    MCV 122.6 (*) 78.0 - 100.0 (fL)    MCH 41.6 (*) 26.0 - 34.0 (pg)    MCHC 33.9  30.0 - 36.0 (g/dL)    RDW 15.9 (*) 11.5 - 15.5 (%)    Platelets 477 (*) 150 - 400 (K/uL)   DIFFERENTIAL     Status: Abnormal   Collection Time   11/28/11  8:37 AM      Component Value Range Comment   Neutrophils Relative 81 (*) 43 - 77 (%)    Neutro Abs 5.1  1.7 - 7.7 (K/uL)    Lymphocytes Relative 14  12 - 46 (%)    Lymphs Abs 0.9  0.7 - 4.0 (K/uL)    Monocytes Relative 5  3 - 12 (%)    Monocytes Absolute 0.3  0.1 - 1.0 (K/uL)    Eosinophils Relative 0  0 - 5 (%)    Eosinophils Absolute  0.0  0.0 - 0.7 (K/uL)    Basophils Relative 1  0 - 1 (%)    Basophils Absolute 0.0  0.0 - 0.1 (K/uL)   BASIC METABOLIC PANEL     Status: Abnormal   Collection Time   11/28/11  8:37 AM      Component Value Range Comment   Sodium 136  135 - 145 (mEq/L)    Potassium 4.9  3.5 - 5.1 (mEq/L)    Chloride 100  96 - 112 (mEq/L)    CO2 25  19 - 32 (mEq/L)    Glucose, Bld 118 (*) 70 - 99 (mg/dL)    BUN 20  6 - 23 (mg/dL)    Creatinine, Ser 0.83  0.50 - 1.10 (mg/dL)    Calcium 10.3  8.4 - 10.5 (mg/dL)    GFR calc non Af Amer 70 (*) >90 (mL/min)    GFR calc Af Amer 82 (*) >90 (mL/min)   CK     Status: Normal   Collection Time   11/28/11  8:37 AM      Component Value Range Comment   Total CK 163  7 - 177 (U/L)   LACTIC ACID, PLASMA     Status: Abnormal   Collection Time   11/28/11  8:38 AM      Component Value Range Comment   Lactic Acid, Venous 2.6 (*) 0.5 - 2.2 (mmol/L)   KETONES, URINE     Status: Normal   Collection Time   11/28/11  8:52 AM      Component Value Range Comment   Ketones, ur NEGATIVE  NEGATIVE (mg/dL)   URINALYSIS, ROUTINE W REFLEX MICROSCOPIC     Status: Normal   Collection Time   11/28/11 10:30 AM      Component Value Range Comment   Color, Urine YELLOW  YELLOW     APPearance CLEAR  CLEAR     Specific Gravity, Urine 1.020  1.005 - 1.030     pH 6.5  5.0 - 8.0     Glucose, UA NEGATIVE  NEGATIVE (mg/dL)    Hgb urine dipstick NEGATIVE  NEGATIVE     Bilirubin Urine NEGATIVE  NEGATIVE     Ketones, ur NEGATIVE  NEGATIVE (mg/dL)    Protein, ur NEGATIVE  NEGATIVE (mg/dL)    Urobilinogen, UA 0.2  0.0 - 1.0 (mg/dL)    Nitrite NEGATIVE  NEGATIVE     Leukocytes, UA NEGATIVE  NEGATIVE  MICROSCOPIC NOT DONE ON URINES WITH NEGATIVE PROTEIN, BLOOD, LEUKOCYTES, NITRITE, OR GLUCOSE <1000 mg/dL.   Dg Pelvis 1-2 Views  11/28/2011  *RADIOLOGY REPORT*  Clinical Data: Status post ORIF for right femoral neck fracture.  RIGHT HIP - COMPLETE 2+ VIEW  Comparison: 11/19/2011 and 11/16/2011.   Findings: Cannulated compression screw is identified in the right femoral neck, as before.  No evidence for hardware complication. The head of the most caudal screw has not been included on the film.  SI joints and symphysis pubis are unremarkable.  IMPRESSION: No complicating features identified on this film although the inferior most aspect of the orthopedic hardware has not been visualized.  Per CMS PQRS reporting requirements (PQRS Measure 24): Given the patient's age of greater than 33 and the fracture site (hip, distal radius, or spine), the patient should be tested for osteoporosis using DXA, and the appropriate treatment considered based on the DXA results.  Original Report Authenticated By: ERIC A. MANSELL, M.D.   Dg Femur Right  11/28/2011  *RADIOLOGY REPORT*  Clinical Data: Status post ORIF for hip fracture.  RIGHT FEMUR - 2 VIEW  Comparison: 11/19/2011.  Findings: The patient has had pain placement for right femoral neck fracture.  No evidence for hardware complications.  IMPRESSION: Status post ORIF for right femoral neck fracture.  No evidence for hardware complications.  Original Report Authenticated By: ERIC A. MANSELL, M.D.   US Venous Img Lower Unilateral Right  11/28/2011  *RADIOLOGY REPORT*  Clinical Data: Status post ORIF for right femoral neck fracture. Now with right lower extremity pain and swelling.  RIGHT LOWER EXTREMITY VENOUS DUPLEX ULTRASOUND  Technique: Gray-scale sonography with graded compression, as well as color Doppler and duplex ultrasound, were performed to evaluate the deep venous system of the right lower extremity from the level of the common femoral vein through the popliteal and proximal calf veins. Spectral Doppler was utilized to evaluate flow at rest and with distal augmentation maneuvers.  Comparison: No comparison studies available.  Findings: The right common femoral vein, superficial femoral vein, deep femoral vein, saphenofemoral junction, and popliteal vein  show normal patent directional flow, normal phasicity, normal augmentation, and normal compression.  Flow signal is seen within the posterior tibial and peroneal veins where visualized below the knee.  IMPRESSION: No evidence for DVT in the right lower extremity.  Original Report Authenticated By: ERIC A. MANSELL, M.D.   [place] Extrem Low Right Ltd  11/28/2011  *RADIOLOGY REPORT*  Clinical Data: Bruising to right medial thigh, evaluate for hematoma status post right hip surgery  ULTRASOUND RIGHT LOWER EXTREMITY LIMITED  Technique:  Ultrasound examination of the region of interest in the right lower extremity was performed.  Comparison:  None.  Findings: Limited evaluation of the area of clinical concern (medial right thigh) demonstrates mild subcutaneous edema without organized fluid collection, abscess, or hematoma.  IMPRESSION: No hematoma is seen at the site of clinical concern (medial right thigh).  Original Report Authenticated By: Julian Hy, M.D.    Review of Systems  Constitutional: Negative for  fever, chills and diaphoresis.  Eyes: Negative for photophobia.  Respiratory: Negative for sputum production and shortness of breath.   Cardiovascular: Positive for palpitations and leg swelling. Negative for chest pain.  Gastrointestinal: Positive for nausea and vomiting. Negative for heartburn and abdominal pain.  Genitourinary: Negative for dysuria.  Musculoskeletal: Positive for myalgias.  Neurological: Positive for weakness. Negative for dizziness, tingling, sensory change, speech change and headaches.  Endo/Heme/Allergies: Bruises/bleeds easily.  Psychiatric/Behavioral: Negative for depression.    Blood pressure 157/88, pulse 110, temperature 97.7 F (36.5 C), temperature source Oral, resp. rate 15, height 5\' 9"  (1.753 m), weight 196 lb (88.905 kg), SpO2 94.00%. Physical Exam  Musculoskeletal:       Vital signs are stable as recorded  General appearance is normal  The patient is  alert and oriented x3  The patient's mood and affect are normal  Gait assessment: in bed  The cardiovascular exam reveals normal pulses and temperature without edema swelling.  The lymphatic system is negative for palpable lymph nodes  The sensory exam is normal.  There are no pathologic reflexes.  Balance is normal.   Exam of the right thigh and leg Inspection mild swelling right thigh, tender anterior compartment , increased tension vs left but not tense or hard  Range of motion hip not tested, POPS TEST OF THE KNEE : MILD DISCOMFORT, NOT EXCRUCIATING OR EXCESSIVE Stability HIP KNEE NORMAL Strength NOT TESTED  Skin NEW ECCHYMOSIS MEDIAL THIGH, OLD LATERAL THIGH NEAR INCISION; STAPLES ARE IN   Upper extremity exam  Inspection and palpation revealed no abnormalities in the upper extremities.  Range of motion is full without contracture.  Motor exam is normal with grade 5 strength.  The joints are fully reduced without subluxation.  There is no atrophy or tremor and muscle tone is normal.  All joints are stable.    RIGHT THIGH IS NORMAL      Assessment/Plan RIGHT THIGH PAIN RIGHT HIP FRACTURE S/P OTIF CANNULATED SCREWS IMPENDING COMPARTMENT SYNDROME   PLAN OBSERVATION SERIAL EXAMS   THE PATIENT IS AWAKE AND RELIABLE    Arther Abbott 11/28/2011, 4:28 PM

## 2011-11-28 NOTE — ED Notes (Signed)
Pt became diaphoretic in xray and vomiting. Dr. Kae Heller made aware.

## 2011-11-28 NOTE — Progress Notes (Addendum)
Subjective: Right thigh pain x since last night after PT with the stairs  Obvious bleed into right thigh    Objective: Vital signs in last 24 hours: Temp:  [97.7 F (36.5 C)] 97.7 F (36.5 C) (04/26 0713) Pulse Rate:  [96-108] 108  (04/26 1004) Resp:  [20-22] 22  (04/26 1004) BP: (142-183)/(100-103) 183/103 mmHg (04/26 1004) SpO2:  [94 %-98 %] 94 % (04/26 1004) Weight:  [196 lb (88.905 kg)] 196 lb (88.905 kg) (04/26 0713)  Intake/Output from previous day:   Intake/Output this shift:     Basename 11/28/11 0837  HGB 11.4*    Basename 11/28/11 0837  WBC 6.2  RBC 2.74*  HCT 33.6*  PLT 477*    Basename 11/28/11 0837  NA 136  K 4.9  CL 100  CO2 25  BUN 20  CREATININE 0.83  GLUCOSE 118*  CALCIUM 10.3    Basename 11/28/11 0837  LABPT --  INR 2.14*    Neurologically intact Sensation intact distally Intact pulses distally Dorsiflexion/Plantar flexion intact Compartment soft  but tender anterior thigh compartment   Assessment/Plan: Observation , pain control  No anticoagulants    Charlotte [name] 11/28/2011, 3:24 PM

## 2011-11-28 NOTE — ED Notes (Signed)
Arrived to er by ems, c/o right hip pain, had hip surgery April 17th, was at pt yesterday and did multiple exercises,  Began having severe pain around 9pm last hs, she has moderate swelling and limited movement to ext.

## 2011-11-29 ENCOUNTER — Encounter (HOSPITAL_COMMUNITY): Payer: Self-pay | Admitting: Anesthesiology

## 2011-11-29 ENCOUNTER — Encounter (HOSPITAL_COMMUNITY)
Admission: EM | Disposition: A | Discharge status: Home Health | Payer: Self-pay | Source: Home / Self Care | Attending: Orthopedic Surgery

## 2011-11-29 ENCOUNTER — Observation Stay (HOSPITAL_COMMUNITY): Payer: Medicare Other | Admitting: Anesthesiology

## 2011-11-29 DIAGNOSIS — R609 Edema, unspecified: Secondary | ICD-10-CM | POA: Diagnosis not present

## 2011-11-29 DIAGNOSIS — T79A29A Traumatic compartment syndrome of unspecified lower extremity, initial encounter: Secondary | ICD-10-CM

## 2011-11-29 DIAGNOSIS — M7981 Nontraumatic hematoma of soft tissue: Secondary | ICD-10-CM | POA: Diagnosis present

## 2011-11-29 DIAGNOSIS — M79A29 Nontraumatic compartment syndrome of unspecified lower extremity: Secondary | ICD-10-CM | POA: Diagnosis not present

## 2011-11-29 DIAGNOSIS — R918 Other nonspecific abnormal finding of lung field: Secondary | ICD-10-CM | POA: Diagnosis not present

## 2011-11-29 DIAGNOSIS — J90 Pleural effusion, not elsewhere classified: Secondary | ICD-10-CM | POA: Diagnosis not present

## 2011-11-29 DIAGNOSIS — I10 Essential (primary) hypertension: Secondary | ICD-10-CM | POA: Diagnosis not present

## 2011-11-29 DIAGNOSIS — IMO0002 Reserved for concepts with insufficient information to code with codable children: Secondary | ICD-10-CM | POA: Diagnosis not present

## 2011-11-29 DIAGNOSIS — I4891 Unspecified atrial fibrillation: Secondary | ICD-10-CM | POA: Diagnosis not present

## 2011-11-29 DIAGNOSIS — Z7901 Long term (current) use of anticoagulants: Secondary | ICD-10-CM | POA: Diagnosis not present

## 2011-11-29 DIAGNOSIS — I509 Heart failure, unspecified: Secondary | ICD-10-CM | POA: Diagnosis not present

## 2011-11-29 DIAGNOSIS — R112 Nausea with vomiting, unspecified: Secondary | ICD-10-CM | POA: Diagnosis not present

## 2011-11-29 DIAGNOSIS — M7989 Other specified soft tissue disorders: Secondary | ICD-10-CM | POA: Diagnosis not present

## 2011-11-29 DIAGNOSIS — G8918 Other acute postprocedural pain: Secondary | ICD-10-CM | POA: Diagnosis not present

## 2011-11-29 DIAGNOSIS — I5033 Acute on chronic diastolic (congestive) heart failure: Secondary | ICD-10-CM | POA: Diagnosis not present

## 2011-11-29 DIAGNOSIS — R0602 Shortness of breath: Secondary | ICD-10-CM | POA: Diagnosis not present

## 2011-11-29 DIAGNOSIS — R0902 Hypoxemia: Secondary | ICD-10-CM | POA: Diagnosis not present

## 2011-11-29 DIAGNOSIS — D45 Polycythemia vera: Secondary | ICD-10-CM | POA: Diagnosis not present

## 2011-11-29 DIAGNOSIS — Z9889 Other specified postprocedural states: Secondary | ICD-10-CM | POA: Diagnosis not present

## 2011-11-29 DIAGNOSIS — M62838 Other muscle spasm: Secondary | ICD-10-CM | POA: Diagnosis present

## 2011-11-29 DIAGNOSIS — S7010XA Contusion of unspecified thigh, initial encounter: Secondary | ICD-10-CM | POA: Diagnosis not present

## 2011-11-29 DIAGNOSIS — M799 Soft tissue disorder, unspecified: Secondary | ICD-10-CM | POA: Diagnosis not present

## 2011-11-29 DIAGNOSIS — M79609 Pain in unspecified limb: Secondary | ICD-10-CM | POA: Diagnosis not present

## 2011-11-29 DIAGNOSIS — M25559 Pain in unspecified hip: Secondary | ICD-10-CM | POA: Diagnosis not present

## 2011-11-29 DIAGNOSIS — M66259 Spontaneous rupture of extensor tendons, unspecified thigh: Secondary | ICD-10-CM | POA: Diagnosis not present

## 2011-11-29 DIAGNOSIS — E876 Hypokalemia: Secondary | ICD-10-CM | POA: Diagnosis present

## 2011-11-29 DIAGNOSIS — D62 Acute posthemorrhagic anemia: Secondary | ICD-10-CM | POA: Diagnosis not present

## 2011-11-29 HISTORY — PX: FASCIOTOMY: SHX132

## 2011-11-29 LAB — BASIC METABOLIC PANEL
CO2: 27 mEq/L (ref 19–32)
GFR calc non Af Amer: 89 mL/min — ABNORMAL LOW (ref >90)
Glucose, Bld: 117 mg/dL — ABNORMAL HIGH (ref 70–99)
Potassium: 5 mEq/L (ref 3.5–5.1)
Sodium: 136 mEq/L (ref 135–145)

## 2011-11-29 LAB — PROTIME-INR
INR: 2.73 — ABNORMAL HIGH (ref 0.00–1.49)
Prothrombin Time: 29.4 seconds — ABNORMAL HIGH (ref 11.6–15.2)

## 2011-11-29 LAB — PREPARE RBC (CROSSMATCH)

## 2011-11-29 LAB — DIFFERENTIAL
Basophils Absolute: 0 10*3/uL (ref 0.0–0.1)
Lymphocytes Relative: 15 % (ref 12–46)
Monocytes Absolute: 0.5 10*3/uL (ref 0.1–1.0)
Monocytes Relative: 8 % (ref 3–12)
Neutro Abs: 5.1 10*3/uL (ref 1.7–7.7)

## 2011-11-29 LAB — CBC
HCT: 29 % — ABNORMAL LOW (ref 36.0–46.0)
Hemoglobin: 9.8 g/dL — ABNORMAL LOW (ref 12.0–15.0)
RBC: 2.29 MIL/uL — ABNORMAL LOW (ref 3.87–5.11)
WBC: 6.7 10*3/uL (ref 4.0–10.5)

## 2011-11-29 LAB — CK TOTAL AND CKMB (NOT AT ARMC): Relative Index: 0.9 (ref 0.0–2.5)

## 2011-11-29 SURGERY — FASCIOTOMY, UPPER EXTREMITY
Anesthesia: General | Laterality: Right

## 2011-11-29 SURGERY — FASCIOTOMY, UPPER EXTREMITY
Anesthesia: General | Laterality: Right | Wound class: Clean

## 2011-11-29 MED ORDER — OXYCODONE HCL 5 MG PO TABS
ORAL_TABLET | ORAL | Status: AC
  Filled 2011-11-29: qty 1

## 2011-11-29 MED ORDER — LACTATED RINGERS IV SOLN
INTRAVENOUS | Status: DC | PRN
  Administered 2011-11-29: 14:00:00 via INTRAVENOUS

## 2011-11-29 MED ORDER — ONDANSETRON HCL 4 MG/2ML IJ SOLN
INTRAMUSCULAR | Status: AC
  Administered 2011-11-29: 4 mg via INTRAVENOUS
  Filled 2011-11-29: qty 2

## 2011-11-29 MED ORDER — ONDANSETRON HCL 4 MG/2ML IJ SOLN: 4.0000 mg | Freq: Once | INTRAMUSCULAR | Status: DC

## 2011-11-29 MED ORDER — PHENOL 1.4 % MT LIQD
1.0000 | OROMUCOSAL | Status: DC | PRN
  Filled 2011-11-29: qty 177

## 2011-11-29 MED ORDER — CEFAZOLIN SODIUM-DEXTROSE 2-3 GM-% IV SOLR: 2.0000 g | INTRAVENOUS | Status: DC

## 2011-11-29 MED ORDER — METOCLOPRAMIDE HCL 10 MG PO TABS: 5.0000 mg | ORAL_TABLET | Freq: Three times a day (TID) | ORAL | Status: DC | PRN

## 2011-11-29 MED ORDER — HYDROMORPHONE HCL PF 1 MG/ML IJ SOLN
0.5000 mg | INTRAMUSCULAR | Status: DC | PRN
  Administered 2011-11-29 – 2011-12-05 (×4): 0.5 mg via INTRAVENOUS
  Filled 2011-11-29 (×4): qty 1

## 2011-11-29 MED ORDER — FLEET ENEMA 7-19 GM/118ML RE ENEM: 1.0000 | ENEMA | Freq: Once | RECTAL | Status: AC | PRN

## 2011-11-29 MED ORDER — SENNOSIDES-DOCUSATE SODIUM 8.6-50 MG PO TABS
1.0000 | ORAL_TABLET | Freq: Every evening | ORAL | Status: DC | PRN
  Administered 2011-11-29 – 2011-12-03 (×3): 1 via ORAL
  Filled 2011-11-29 (×6): qty 1

## 2011-11-29 MED ORDER — ACETAMINOPHEN 10 MG/ML IV SOLN
1000.0000 mg | Freq: Once | INTRAVENOUS | Status: AC
  Administered 2011-11-29: 1000 mg via INTRAVENOUS

## 2011-11-29 MED ORDER — VITAMIN K1 10 MG/ML IJ SOLN
5.0000 mg | Freq: Once | INTRAVENOUS | Status: AC
  Administered 2011-11-29: 5 mg via INTRAVENOUS
  Filled 2011-11-29: qty 1
  Filled 2011-11-29: qty 0.5

## 2011-11-29 MED ORDER — CEFAZOLIN SODIUM-DEXTROSE 2-3 GM-% IV SOLR
INTRAVENOUS | Status: AC
  Administered 2011-11-29: 2000 mg via INTRAVENOUS
  Filled 2011-11-29: qty 50

## 2011-11-29 MED ORDER — LIDOCAINE HCL 1 % IJ SOLN
INTRAMUSCULAR | Status: DC | PRN
  Administered 2011-11-29: 30 mg via INTRADERMAL

## 2011-11-29 MED ORDER — FENTANYL CITRATE 0.05 MG/ML IJ SOLN
INTRAMUSCULAR | Status: AC
  Filled 2011-11-29: qty 2

## 2011-11-29 MED ORDER — FENTANYL CITRATE 0.05 MG/ML IJ SOLN
INTRAMUSCULAR | Status: DC | PRN
  Administered 2011-11-29 (×4): 50 ug via INTRAVENOUS

## 2011-11-29 MED ORDER — ETOMIDATE 2 MG/ML IV SOLN
INTRAVENOUS | Status: DC | PRN
  Administered 2011-11-29: 12 mg via INTRAVENOUS
  Administered 2011-11-29: 4 mg via INTRAVENOUS

## 2011-11-29 MED ORDER — MENTHOL 3 MG MT LOZG
1.0000 | LOZENGE | OROMUCOSAL | Status: DC | PRN
  Filled 2011-11-29: qty 9

## 2011-11-29 MED ORDER — METHOCARBAMOL 100 MG/ML IJ SOLN
500.0000 mg | Freq: Four times a day (QID) | INTRAVENOUS | Status: DC
  Filled 2011-11-29 (×36): qty 5

## 2011-11-29 MED ORDER — SODIUM CHLORIDE 0.9 % IJ SOLN
INTRAMUSCULAR | Status: AC
  Filled 2011-11-29: qty 3

## 2011-11-29 MED ORDER — METHOCARBAMOL 100 MG/ML IJ SOLN
INTRAMUSCULAR | Status: AC
  Filled 2011-11-29: qty 10

## 2011-11-29 MED ORDER — CEFAZOLIN SODIUM 1-5 GM-% IV SOLN
INTRAVENOUS | Status: DC | PRN
  Administered 2011-11-29: 2 g via INTRAVENOUS

## 2011-11-29 MED ORDER — FERROUS SULFATE 325 (65 FE) MG PO TABS
325.0000 mg | ORAL_TABLET | Freq: Three times a day (TID) | ORAL | Status: DC
  Administered 2011-11-29 – 2011-12-08 (×26): 325 mg via ORAL
  Filled 2011-11-29 (×25): qty 1

## 2011-11-29 MED ORDER — 0.9 % SODIUM CHLORIDE (POUR BTL) OPTIME
TOPICAL | Status: DC | PRN
  Administered 2011-11-29: 1000 mL

## 2011-11-29 MED ORDER — HYDROMORPHONE HCL PF 1 MG/ML IJ SOLN
INTRAMUSCULAR | Status: AC
  Administered 2011-11-29: 0.5 mg via INTRAVENOUS
  Filled 2011-11-29: qty 1

## 2011-11-29 MED ORDER — ONDANSETRON HCL 4 MG PO TABS: 4.0000 mg | ORAL_TABLET | Freq: Four times a day (QID) | ORAL | Status: DC | PRN

## 2011-11-29 MED ORDER — SUCCINYLCHOLINE CHLORIDE 20 MG/ML IJ SOLN
INTRAMUSCULAR | Status: DC | PRN
  Administered 2011-11-29: 120 mg via INTRAVENOUS

## 2011-11-29 MED ORDER — ALUM & MAG HYDROXIDE-SIMETH 200-200-20 MG/5ML PO SUSP: 30.0000 mL | ORAL | Status: DC | PRN

## 2011-11-29 MED ORDER — MIDAZOLAM HCL 2 MG/2ML IJ SOLN
INTRAMUSCULAR | Status: AC
  Filled 2011-11-29: qty 2

## 2011-11-29 MED ORDER — MIDAZOLAM HCL 5 MG/5ML IJ SOLN
INTRAMUSCULAR | Status: DC | PRN
  Administered 2011-11-29: 2 mg via INTRAVENOUS

## 2011-11-29 MED ORDER — LIDOCAINE HCL (PF) 1 % IJ SOLN
INTRAMUSCULAR | Status: AC
  Filled 2011-11-29: qty 5

## 2011-11-29 MED ORDER — ONDANSETRON HCL 4 MG/2ML IJ SOLN
INTRAMUSCULAR | Status: AC
  Filled 2011-11-29: qty 2

## 2011-11-29 MED ORDER — ONDANSETRON HCL 4 MG/2ML IJ SOLN
INTRAMUSCULAR | Status: DC | PRN
  Administered 2011-11-29: 4 mg via INTRAVENOUS

## 2011-11-29 MED ORDER — ETOMIDATE 2 MG/ML IV SOLN
INTRAVENOUS | Status: AC
  Filled 2011-11-29: qty 10

## 2011-11-29 MED ORDER — DOCUSATE SODIUM 100 MG PO CAPS
100.0000 mg | ORAL_CAPSULE | Freq: Two times a day (BID) | ORAL | Status: DC
  Administered 2011-11-29 – 2011-12-08 (×18): 100 mg via ORAL
  Filled 2011-11-29 (×19): qty 1

## 2011-11-29 MED ORDER — OXYCODONE HCL 5 MG PO TABS: 5.0000 mg | ORAL_TABLET | ORAL | Status: DC

## 2011-11-29 MED ORDER — SODIUM CHLORIDE 0.9 % IV SOLN
INTRAVENOUS | Status: DC
  Administered 2011-11-29 – 2011-11-30 (×2): via INTRAVENOUS
  Administered 2011-11-30: 1000 mL via INTRAVENOUS
  Administered 2011-12-01: 07:00:00 via INTRAVENOUS

## 2011-11-29 MED ORDER — HYDROMORPHONE HCL PF 1 MG/ML IJ SOLN
0.5000 mg | INTRAMUSCULAR | Status: DC | PRN
  Administered 2011-11-29: 0.5 mg via INTRAVENOUS

## 2011-11-29 MED ORDER — ALBUTEROL SULFATE (5 MG/ML) 0.5% IN NEBU
2.5000 mg | INHALATION_SOLUTION | Freq: Four times a day (QID) | RESPIRATORY_TRACT | Status: DC
  Administered 2011-11-29 – 2011-12-05 (×22): 2.5 mg via RESPIRATORY_TRACT
  Filled 2011-11-29 (×22): qty 0.5

## 2011-11-29 MED ORDER — CEFAZOLIN SODIUM-DEXTROSE 2-3 GM-% IV SOLR
2.0000 g | Freq: Four times a day (QID) | INTRAVENOUS | Status: AC
  Administered 2011-11-29: 2 g via INTRAVENOUS
  Administered 2011-11-29: 2000 mg via INTRAVENOUS
  Administered 2011-11-30: 2 g via INTRAVENOUS
  Filled 2011-11-29 (×3): qty 50

## 2011-11-29 MED ORDER — ACETAMINOPHEN 10 MG/ML IV SOLN
INTRAVENOUS | Status: AC
  Administered 2011-11-29: 1000 mg via INTRAVENOUS
  Filled 2011-11-29: qty 100

## 2011-11-29 MED ORDER — ROCURONIUM BROMIDE 50 MG/5ML IV SOLN
INTRAVENOUS | Status: AC
  Filled 2011-11-29: qty 1

## 2011-11-29 MED ORDER — SUCCINYLCHOLINE CHLORIDE 20 MG/ML IJ SOLN
INTRAMUSCULAR | Status: AC
  Filled 2011-11-29: qty 1

## 2011-11-29 MED ORDER — ONDANSETRON HCL 4 MG/2ML IJ SOLN: 4.0000 mg | Freq: Four times a day (QID) | INTRAMUSCULAR | Status: DC | PRN

## 2011-11-29 MED ORDER — METHOCARBAMOL 500 MG PO TABS
500.0000 mg | ORAL_TABLET | Freq: Four times a day (QID) | ORAL | Status: DC
  Administered 2011-11-29 – 2011-12-07 (×33): 500 mg via ORAL
  Filled 2011-11-29 (×32): qty 1

## 2011-11-29 MED ORDER — SENNA 8.6 MG PO TABS
1.0000 | ORAL_TABLET | Freq: Two times a day (BID) | ORAL | Status: DC
  Administered 2011-11-29 – 2011-12-07 (×14): 8.6 mg via ORAL
  Filled 2011-11-29 (×13): qty 1

## 2011-11-29 MED ORDER — METOPROLOL TARTRATE 1 MG/ML IV SOLN
INTRAVENOUS | Status: AC
  Filled 2011-11-29: qty 5

## 2011-11-29 MED ORDER — METOCLOPRAMIDE HCL 5 MG/ML IJ SOLN: 5.0000 mg | Freq: Three times a day (TID) | INTRAMUSCULAR | Status: DC | PRN

## 2011-11-29 MED ORDER — CEFAZOLIN SODIUM-DEXTROSE 2-3 GM-% IV SOLR
INTRAVENOUS | Status: AC
  Filled 2011-11-29: qty 100

## 2011-11-29 MED ORDER — BISACODYL 5 MG PO TBEC
5.0000 mg | DELAYED_RELEASE_TABLET | Freq: Every day | ORAL | Status: DC | PRN
  Administered 2011-11-30: 5 mg via ORAL
  Filled 2011-11-29: qty 1

## 2011-11-29 MED ORDER — CEFAZOLIN SODIUM-DEXTROSE 2-3 GM-% IV SOLR
INTRAVENOUS | Status: AC
  Filled 2011-11-29: qty 50

## 2011-11-29 MED ORDER — METOPROLOL TARTRATE 1 MG/ML IV SOLN
INTRAVENOUS | Status: DC | PRN
  Administered 2011-11-29: 2 mg via INTRAVENOUS

## 2011-11-29 MED ORDER — OXYCODONE HCL 5 MG PO TABS
5.0000 mg | ORAL_TABLET | ORAL | Status: DC
  Administered 2011-11-29 – 2011-12-01 (×8): 5 mg via ORAL
  Filled 2011-11-29 (×8): qty 1

## 2011-11-29 MED ORDER — TEMAZEPAM 15 MG PO CAPS: 15.0000 mg | ORAL_CAPSULE | Freq: Every evening | ORAL | Status: DC | PRN

## 2011-11-29 SURGICAL SUPPLY — 31 items
BANDAGE ELASTIC 6 VELCRO NS (GAUZE/BANDAGES/DRESSINGS) ×2 IMPLANT
BANDAGE GAUZE ELAST BULKY 4 IN (GAUZE/BANDAGES/DRESSINGS) ×4 IMPLANT
BLADE SURG SZ10 CARB STEEL (BLADE) ×4 IMPLANT
BNDG COHESIVE 4X5 TAN STRL (GAUZE/BANDAGES/DRESSINGS) ×2 IMPLANT
COVER LIGHT HANDLE STERIS (MISCELLANEOUS) ×4 IMPLANT
DRAPE INCISE IOBAN 44X35 STRL (DRAPES) ×2 IMPLANT
DRAPE ORTHO 2.5IN SPLIT 77X108 (DRAPES) ×2 IMPLANT
DRAPE ORTHO SPLIT 77X108 STRL (DRAPES) ×2
DRAPE PROXIMA HALF (DRAPES) ×2 IMPLANT
ELECT REM PT RETURN 9FT ADLT (ELECTROSURGICAL) ×2
ELECTRODE REM PT RTRN 9FT ADLT (ELECTROSURGICAL) ×1 IMPLANT
GAUZE XEROFORM 5X9 LF (GAUZE/BANDAGES/DRESSINGS) ×2 IMPLANT
GLOVE BIOGEL PI IND STRL 7.0 (GLOVE) ×2 IMPLANT
GLOVE BIOGEL PI INDICATOR 7.0 (GLOVE) ×2
GLOVE ECLIPSE 6.5 STRL STRAW (GLOVE) ×2 IMPLANT
GOWN STRL REIN XL XLG (GOWN DISPOSABLE) ×6 IMPLANT
INST SET MINOR BONE (KITS) ×2 IMPLANT
MARKER SKIN DUAL TIP RULER LAB (MISCELLANEOUS) ×2 IMPLANT
PACK BASIC III (CUSTOM PROCEDURE TRAY) ×1
PACK SRG BSC III STRL LF ECLPS (CUSTOM PROCEDURE TRAY) ×1 IMPLANT
PAD ABD 5X9 TENDERSORB (GAUZE/BANDAGES/DRESSINGS) ×2 IMPLANT
PADDING CAST COTTON 6X4 STRL (CAST SUPPLIES) ×2 IMPLANT
SET BASIN LINEN APH (SET/KITS/TRAYS/PACK) ×2 IMPLANT
SPONGE GAUZE 4X4 12PLY (GAUZE/BANDAGES/DRESSINGS) ×4 IMPLANT
SPONGE LAP 18X18 X RAY DECT (DISPOSABLE) ×6 IMPLANT
STAPLER VISISTAT 35W (STAPLE) ×2 IMPLANT
STOCKINETTE IMPERVIOUS LG (DRAPES) ×2 IMPLANT
SUT VIC AB 0 CTX 36 (SUTURE) ×4
SUT VIC AB 0 CTX36XBRD ANTBCTR (SUTURE) ×4 IMPLANT
SYR BULB IRRIGATION 50ML (SYRINGE) ×2 IMPLANT
YANKAUER SUCT BULB TIP 10FT TU (MISCELLANEOUS) ×2 IMPLANT

## 2011-11-29 NOTE — Anesthesia Postprocedure Evaluation (Addendum)
  Anesthesia Post-op Note  Patient: Charlotte [name]  Procedure(s) Performed: Procedure(s) (LRB): FASCIOTOMY (Right)  Patient Location: PACU  Anesthesia Type: General  Level of Consciousness: sedated  Airway and Oxygen Therapy: Patient Spontanous Breathing and Patient connected to face mask oxygen  Post-op Pain: none  Post-op Assessment: Post-op Vital signs reviewed, Patient's Cardiovascular Status Stable, Respiratory Function Stable, Patent Airway and No signs of Nausea or vomiting  Post-op Vital Signs: Reviewed and stable  Complications: No apparent anesthesia complications Patient doing well today.  VSS.  No complaints or concerns regarding anesthesia.

## 2011-11-29 NOTE — Brief Op Note (Signed)
11/28/2011 - 11/29/2011  3:29 PM  PATIENT:  Charlotte [name]  71 y.o. female  PRE-OPERATIVE DIAGNOSIS: Compartment Syndrome Right Thigh                                                       POST-OPERATIVE DIAGNOSIS:  Compartment Syndrome Right Thigh                                                         Rupture vastus intermedius  PROCEDURE:  Procedure(s) (LRB): FASCIOTOMY (Right) Irrigation and debridement right quadriceps  Operative findings the deep musculature of the quadriceps muscle primarily the vastus intermedius was ruptured with a large hematoma. The rectus femoris vastus lateralis and vastus medialis were intact with good color and good contractility and no clot. The vastus intermedius however was ruptured from its proximal portion with large clot in its compartment and the distal portion of the musculature had moderate contractility.  SURGEON:  Surgeon(s) and Role:    * Carole Civil, MD - Primary  PHYSICIAN ASSISTANT:   ASSISTANTS: none   ANESTHESIA:   general  EBL:  Total I/O In: 400 [I.V.:400] Out: -   BLOOD ADMINISTERED:2 FFP  DRAINS: 1 Curlex packing   LOCAL MEDICATIONS USED:  NONE  SPECIMEN:  No Specimen  DISPOSITION OF SPECIMEN:  N/A  COUNTS:  YES  TOURNIQUET:  * No tourniquets in log *  DICTATION: .Dragon Dictation  PLAN OF CARE: Admit to ICU  PATIENT DISPOSITION:  PACU - hemodynamically stable.   Delay start of Pharmacological VTE agent (>24hrs) due to surgical blood loss or risk of bleeding: yes

## 2011-11-29 NOTE — Op Note (Signed)
11/28/2011 - 11/29/2011  Surgery date April 27  3:29 PM  PATIENT:  Charlotte [name]  71 y.o. female  PRE-OPERATIVE DIAGNOSIS: Compartment Syndrome Right Thigh                                                       POST-OPERATIVE DIAGNOSIS:  Compartment Syndrome Right Thigh                                                         Rupture vastus intermedius  Primary indication for procedure compartment syndrome with elevated compartment pressures and clinical course not improving  PROCEDURE:  Procedure(s) (LRB): FASCIOTOMY (Right) Irrigation and debridement right quadriceps  Operative findings the deep musculature of the quadriceps muscle primarily the vastus intermedius was ruptured with a large hematoma. The rectus femoris vastus lateralis and vastus medialis were intact with good color and good contractility and no clot. The vastus intermedius however was ruptured from its proximal portion with large clot in its compartment and the distal portion of the musculature had moderate contractility.  SURGEON:  Surgeon(s) and Role:    * Carole Civil, MD - Primary  Details of procedure:  The site was marked and confirmed the chart was reviewed and the consent was signed  The patient was taken to the operating room given 2 g of Ancef based on her weight of over 80 kg. Her FFP second unit was given as well.  General anesthesia was given. In the supine position with a 5 pound sandbag under the right leg the right lower extremity was prepped and draped with Betadine. The timeout procedure was completed  A midline incision was made over the rectus femoris down to the level of the patella.  The subcutaneous tissue was divided. The rectus fascia was opened. Pressure release was noted but was not as traumatic as expected  The vastus medialis was then explored and the fascia was opened. The vastus lateralis was explored and the fascia was opened as well. These 2 compartments had minimal  evidence of pressure release. Further dissection was carried out to the vastus intermedius where a large clot was found of old blood. The muscle had ruptured from its proximal aspect. This muscle was not contractile and was excised. The distal portion of the muscle was noted to have moderate contractility and was kept. Further hand dissection was made throughout all the compartments proximally and distally in between the compartments to evaluate for any other pathology.  The wound was packed with a Kerlix wet with saline and closure was performed with 0 Monocryl and staples in a loose fashion. At the end of the procedure it was noted that the compartments were soft at the side looked very good and the compartments had been decompressed PHYSICIAN ASSISTANT:   ASSISTANTS: none   ANESTHESIA:   general  EBL:  Total I/O In: 400 [I.V.:400] Out: -   BLOOD ADMINISTERED:2 FFP  DRAINS: 1 Curlex packing   LOCAL MEDICATIONS USED:  NONE  SPECIMEN:  No Specimen  DISPOSITION OF SPECIMEN:  N/A  COUNTS:  YES  TOURNIQUET:  * No tourniquets in log *  DICTATION: .Viviann Spare  Dictation  PLAN OF CARE: Admit to ICU  PATIENT DISPOSITION:  PACU - hemodynamically stable.   Delay start of Pharmacological VTE agent (>24hrs) due to surgical blood loss or risk of bleeding: yes

## 2011-11-29 NOTE — Anesthesia Procedure Notes (Signed)
Procedure Name: Intubation Date/Time: 11/29/2011 2:21 PM Performed by: Tressie Stalker E Pre-anesthesia Checklist: Patient identified, Patient being monitored, Timeout performed, Emergency Drugs available and Suction available Patient Re-evaluated:Patient Re-evaluated prior to inductionOxygen Delivery Method: Circle System Utilized Preoxygenation: Pre-oxygenation with 100% oxygen Intubation Type: IV induction, Rapid sequence and Cricoid Pressure applied Laryngoscope Size: Mac and 3 Grade View: Grade I Tube type: Oral Tube size: 7.0 mm Number of attempts: 1 Airway Equipment and Method: stylet Placement Confirmation: ETT inserted through vocal cords under direct vision,  positive ETCO2 and breath sounds checked- equal and bilateral Secured at: 21 cm Tube secured with: Tape Dental Injury: Teeth and Oropharynx as per pre-operative assessment

## 2011-11-29 NOTE — Anesthesia Preprocedure Evaluation (Signed)
Anesthesia Evaluation  Patient identified by MRN, date of birth, ID band Patient awake    Reviewed: Allergy & Precautions, H&P , NPO status , Patient's Chart, lab work & pertinent test results, reviewed documented beta blocker date and time   History of Anesthesia Complications (+) PONV  Airway Mallampati: I TM Distance: >3 FB     Dental  (+) Edentulous Lower and Edentulous Upper   Pulmonary former smoker breath sounds clear to auscultation        Cardiovascular hypertension, Pt. on medications + dysrhythmias Atrial Fibrillation Rhythm:Irregular Rate:Normal     Neuro/Psych    GI/Hepatic   Endo/Other  Hypothyroidism   Renal/GU      Musculoskeletal   Abdominal   Peds  Hematology   Anesthesia Other Findings   Reproductive/Obstetrics                           Anesthesia Physical Anesthesia Plan  ASA: III and Emergent  Anesthesia Plan: General   Post-op Pain Management:    Induction: Intravenous  Airway Management Planned: Oral ETT  Additional Equipment:   Intra-op Plan:   Post-operative Plan: Extubation in OR  Informed Consent: I have reviewed the patients History and Physical, chart, labs and discussed the procedure including the risks, benefits and alternatives for the proposed anesthesia with the patient or authorized representative who has indicated his/her understanding and acceptance.     Plan Discussed with: Anesthesiologist  Anesthesia Plan Comments:         Anesthesia Quick Evaluation

## 2011-11-29 NOTE — Progress Notes (Signed)
Subjective: Right thigh pain   Objective: Vital signs in last 24 hours: Temp:  [97.9 F (36.6 C)] 97.9 F (36.6 C) (04/27 0419) Pulse Rate:  [82-110] 82  (04/27 0419) Resp:  [10-18] 18  (04/27 0419) BP: (120-167)/(77-115) 120/77 mmHg (04/27 0419) SpO2:  [93 %-98 %] 94 % (04/27 0419)  Intake/Output from previous day: 04/26 0701 - 04/27 0700 In: 978 [P.O.:240; I.V.:738] Out: 575 [Urine:575] Intake/Output this shift:     Basename 11/29/11 0705 11/28/11 0837  HGB 9.8* 11.4*    Basename 11/29/11 0705 11/28/11 0837  WBC 6.7 6.2  RBC 2.29* 2.74*  HCT 29.0* 33.6*  PLT 446* 477*    Basename 11/29/11 0705 11/28/11 0837  NA 136 136  K 5.0 4.9  CL 103 100  CO2 27 25  BUN 16 20  CREATININE 0.64 0.83  GLUCOSE 117* 118*  CALCIUM 9.2 10.3    Basename 11/29/11 0705 11/28/11 0837  LABPT -- --  INR 2.63* 2.14*   The cpk is elevated from yesterday  Although the thigh is less tense and the pain is better controlled the pressures are elevated   right thigh compartment pressure: anterior compartment 44, 42 Lateral compartment: 33  Assessment/Plan:  Compartment syndrome right thigh   Rt thigh fasciotomy    Arther Abbott 11/29/2011, 10:59 AM

## 2011-11-29 NOTE — Transfer of Care (Signed)
Immediate Anesthesia Transfer of Care Note  Patient: Charlotte [name]  Procedure(s) Performed: Procedure(s) (LRB): FASCIOTOMY (Right)  Patient Location: PACU  Anesthesia Type: General  Level of Consciousness: sedated  Airway & Oxygen Therapy: Patient Spontanous Breathing and Patient connected to face mask oxygen  Post-op Assessment: Report given to PACU RN  Post vital signs: Reviewed and stable  Complications: No apparent anesthesia complications

## 2011-11-29 NOTE — Progress Notes (Signed)
Received call from [name] in lab with a critical lab value of CK-MB 7.4 @ 817.  Will notify physician and continue to monitor px.

## 2011-11-29 NOTE — Consult Note (Signed)
Charlotte [name] MRN: [id number] DOB/AGE: Feb 23, 1941 71 y.o. Referring Physician: Dr. Aline Brochure. Admit date: 11/28/2011 Chief Complaint:  Right leg swelling with probable compartment syndrome, chronic atrial fibrillation on anticoagulation, polycythemia rubra vera. HPI: This 71 year old lady was admitted to the hospital yesterday with symptoms of her swollen right leg associated with pain. The pain was rated 9/10. Dr. Aline Brochure, orthopedics, feels that the patient likely has impending compartment syndrome. Her CPK has been elevated from yesterday. Her leg is firm and swollen. She has chronic medical problems including chronic atrial fibrillation, polycythemia rubra vera, hypertension.  Past Medical History  Diagnosis Date  . Hypertension   . Arthritis   . Varicose veins   . Deaf   . Elevated WBC count     and platelets  . Thyroid disease   . Irregular heart rate   . Polycythemia vera   . Peripheral neuropathy     feet  . Polycythemia vera 10/01/2011  . Atrial fibrillation, chronic    Past Surgical History  Procedure Date  . Blt   . Abdominal hysterectomy   . Coronary angioplasty   . Ct of abd   . Hip pinning,cannulated 11/19/2011    Procedure: CANNULATED HIP PINNING;  Surgeon: Sanjuana Kava, MD;  Location: [facility];  Service: Orthopedics;  Laterality: Right;        Family History  Problem Relation Age of Onset  . Diabetes Son     Social History:  reports that she has never smoked. She does not have any smokeless tobacco history on file. She reports that she does not drink alcohol or use illicit drugs.   Allergies: No Known Allergies  Medications Prior to Admission  Medication Sig Dispense Refill  . aspirin EC 81 MG tablet Take 81 mg by mouth daily.      Marland Kitchen diltiazem (TIAZAC) 180 MG 24 hr capsule Take 180 mg by mouth daily.      Marland Kitchen enoxaparin (LOVENOX) 100 MG/ML injection Inject 1.4 mLs (140 mg total) into the skin daily.  10 Syringe  0  . gabapentin (NEURONTIN) 100 MG  capsule Take 100 mg by mouth 3 (three) times daily.        . hydroxyurea (HYDREA) 500 MG capsule Take 500-1,000 mg by mouth 2 (two) times daily. Take 2 capsules each morning and 1 capsule each evening.      Marland Kitchen levothyroxine (SYNTHROID, LEVOTHROID) 25 MCG tablet Take 25 mcg by mouth daily.        . metoprolol (TOPROL-XL) 100 MG 24 hr tablet Take 100 mg by mouth 2 (two) times daily.        Marland Kitchen oxyCODONE (OXY IR/ROXICODONE) 5 MG immediate release tablet Take 1 tablet (5 mg total) by mouth every 4 (four) hours as needed.  30 tablet  0  . warfarin (COUMADIN) 5 MG tablet Take 5 mg by mouth daily.       . metoCLOPramide (REGLAN) 10 MG tablet Take 1 tablet (10 mg total) by mouth every 6 (six) hours as needed (nausea).  30 tablet  0       [id number] from the symptoms mentioned above,there are no other symptoms referable to all systems reviewed.  Physical Exam: Blood pressure 120/77, pulse 82, temperature 97.9 F (36.6 C), temperature source Oral, resp. rate 18, height 5\' 9"  (1.753 m), weight 88.905 kg (196 lb), SpO2 94.00%. She currently looks systemically well. She is not toxic or septic. She is hemodynamically stable. She is in atrial fibrillation. Lung fields are clear. She is  alert and orientated. Her abdomen is soft and nontender. Examination of the right leg shows significant swelling in the whole leg with ecchymosis and there is a tightness around the leg.    Basename 11/29/11 0705 11/28/11 0837  WBC 6.7 6.2  NEUTROABS 5.1 5.1  HGB 9.8* 11.4*  HCT 29.0* 33.6*  MCV 126.6* 122.6*  PLT 446* 477*    Basename 11/29/11 0705 11/28/11 0837  NA 136 136  K 5.0 4.9  CL 103 100  CO2 27 25  GLUCOSE 117* 118*  BUN 16 20  CREATININE 0.64 0.83  CALCIUM 9.2 10.3  MG -- --   INR 2.63. CPK 804.      Dg Chest 1 View  11/15/2011  *RADIOLOGY REPORT*  Clinical Data: Question right hip fracture  CHEST - 1 VIEW  Comparison: Chest radiograph 05/12/2009.  Findings: Stable enlarged heart silhouette.   No effusion, infiltrate, or pneumothorax. No acute osseous abnormality.  IMPRESSION: No acute cardiopulmonary process.  Original Report Authenticated By: Suzy Bouchard, M.D.   Dg Pelvis 1-2 Views  11/28/2011  *RADIOLOGY REPORT*  Clinical Data: Status post ORIF for right femoral neck fracture.  RIGHT HIP - COMPLETE 2+ VIEW  Comparison: 11/19/2011 and 11/16/2011.  Findings: Cannulated compression screw is identified in the right femoral neck, as before.  No evidence for hardware complication. The head of the most caudal screw has not been included on the film.  SI joints and symphysis pubis are unremarkable.  IMPRESSION: No complicating features identified on this film although the inferior most aspect of the orthopedic hardware has not been visualized.  Per CMS PQRS reporting requirements (PQRS Measure 24): Given the patient's age of greater than 80 and the fracture site (hip, distal radius, or spine), the patient should be tested for osteoporosis using DXA, and the appropriate treatment considered based on the DXA results.  Original Report Authenticated By: ERIC A. MANSELL, M.D.   Dg Wrist Complete Left  11/15/2011  *RADIOLOGY REPORT*  Clinical Data: Fall.  Pain and bruising across posterior wrist.  LEFT WRIST - COMPLETE 3+ VIEW  Comparison: Hand films from the same day.  Findings: A nondisplaced fracture at the base of the fifth proximal phalanx is again noted.  The carpal bones are intact.  No significant bone or soft tissue abnormalities present at the wrist.  IMPRESSION:  1.  Nondisplaced fracture involving the proximal phalanx in the fifth digit. 2.  No acute abnormality of the wrist.  Original Report Authenticated By: Resa Miner. MATTERN, M.D.   Dg Hip 1 View Right  11/17/2011  *RADIOLOGY REPORT*  Clinical Data: Follow up right hip fracture.  Assess for interval displacement.  RIGHT HIP - 1 VIEW  Comparison: Right hip radiographs performed 11/15/2011  Findings: A minimally displaced transcervical  fracture is again noted through the right femoral neck, grossly unchanged from the prior study.  The right femoral head remains seated at the acetabulum.  No significant soft tissue abnormalities are characterized on radiograph.  No new fractures are seen.  IMPRESSION: No significant interval change in the minimally displaced transcervical fracture through the right femoral neck.  Original Report Authenticated By: Santa Lighter, M.D.   Dg Hip Complete Right  11/15/2011  *RADIOLOGY REPORT*  Clinical Data: Lateral hip pain status post fall today.  RIGHT HIP - COMPLETE 2+ VIEW  Comparison: None.  Findings: There is generalized osteopenia.  There is a mildly impacted fracture of the mid right femoral neck.  There is no evidence of pelvic fracture or  dislocation.  No significant hip joint space loss is identified.  IMPRESSION: Mildly impacted acute fracture of the mid right femoral neck. Per CMS PQRS reporting requirements (PQRS Measure 24): Given the patient's age of greater than 25 and the fracture site (hip, distal radius, or spine), the patient should be tested for osteoporosis using DXA, and the appropriate treatment considered based on the DXA results.  Original Report Authenticated By: Vivia Ewing, M.D.   Dg Hip Operative Right  11/19/2011  *RADIOLOGY REPORT*  Clinical Data: Fracture fixation.  OPERATIVE RIGHT HIP  Comparison: Plain films 11/15/2011.  Findings: We are provided three fluoroscopic intraoperative spot views of the right hip.  Images demonstrate placement of three pins for fixation of a subcapital right hip fracture.  No evidence of complication.  IMPRESSION: ORIF right hip fracture.  Original Report Authenticated By: Arvid Right. D'ALESSIO, M.D.   Dg Femur Right  11/28/2011  *RADIOLOGY REPORT*  Clinical Data: Status post ORIF for hip fracture.  RIGHT FEMUR - 2 VIEW  Comparison: 11/19/2011.  Findings: The patient has had pain placement for right femoral neck fracture.  No evidence for  hardware complications.  IMPRESSION: Status post ORIF for right femoral neck fracture.  No evidence for hardware complications.  Original Report Authenticated By: ERIC A. MANSELL, M.D.   US Venous Img Lower Unilateral Right  11/28/2011  *RADIOLOGY REPORT*  Clinical Data: Status post ORIF for right femoral neck fracture. Now with right lower extremity pain and swelling.  RIGHT LOWER EXTREMITY VENOUS DUPLEX ULTRASOUND  Technique: Gray-scale sonography with graded compression, as well as color Doppler and duplex ultrasound, were performed to evaluate the deep venous system of the right lower extremity from the level of the common femoral vein through the popliteal and proximal calf veins. Spectral Doppler was utilized to evaluate flow at rest and with distal augmentation maneuvers.  Comparison: No comparison studies available.  Findings: The right common femoral vein, superficial femoral vein, deep femoral vein, saphenofemoral junction, and popliteal vein show normal patent directional flow, normal phasicity, normal augmentation, and normal compression.  Flow signal is seen within the posterior tibial and peroneal veins where visualized below the knee.  IMPRESSION: No evidence for DVT in the right lower extremity.  Original Report Authenticated By: ERIC A. MANSELL, M.D.   [place] Extrem Low Right Ltd  11/28/2011  *RADIOLOGY REPORT*  Clinical Data: Bruising to right medial thigh, evaluate for hematoma status post right hip surgery  ULTRASOUND RIGHT LOWER EXTREMITY LIMITED  Technique:  Ultrasound examination of the region of interest in the right lower extremity was performed.  Comparison:  None.  Findings: Limited evaluation of the area of clinical concern (medial right thigh) demonstrates mild subcutaneous edema without organized fluid collection, abscess, or hematoma.  IMPRESSION: No hematoma is seen at the site of clinical concern (medial right thigh).  Original Report Authenticated By: Julian Hy, M.D.   Dg  Hand Complete Left  11/15/2011  *RADIOLOGY REPORT*  Clinical Data: Status post fall.  Proximal hand laceration.  LEFT HAND - COMPLETE 3+ VIEW  Comparison: None.  Findings: There is generalized osteopenia with interphalangeal joint space loss.  There is suspicion of a nondisplaced intra- articular fracture involving the ulnar base of the fifth proximal phalanx, best seen on the oblique view.  No other acute fractures are identified.  No foreign bodies are seen.  IMPRESSION: Suspected fracture of the base of the fifth proximal phalanx. Correlate clinically.  Mild interphalangeal degenerative changes.  Original Report Authenticated By: Jenne Pane.  Lin Landsman, M.D.   Dg Finger Little Left  11/15/2011  *RADIOLOGY REPORT*  Clinical Data: Left little finger injury and pain.  LEFT LITTLE FINGER 2+V  Comparison: None  Findings: A fracture at the base of the proximal phalanx is identified extending into the joint.  No significant displacement is identified. There is no evidence of subluxation or dislocation. Soft tissue swelling is identified.  IMPRESSION: Nondisplaced intrarticular fracture at the base of the proximal phalanx.  Original Report Authenticated By: Lura Em, M.D.   Impression: 1. Impending compartment syndrome in the right leg. 2. Chronic atrial fibrillation with anticoagulation, therapeutic. 3. Polycythemia rubra vera with hemoglobin 9.8, which is a decrease from yesterday., Likely indicating bleeding. 4. Anemia, acute blood loss. 5. Hypertension, controlled.      Plan: 1. Reverse the effects of Coumadin with vitamin K. Repeat INR. 2.  SCDs for the time being. 3. We'll follow with you postoperatively.   Doree Albee Pager [phone]  11/29/2011, 10:17 AM

## 2011-11-30 LAB — PROTIME-INR: INR: 1.44 (ref 0.00–1.49)

## 2011-11-30 LAB — PREPARE FRESH FROZEN PLASMA: Unit division: 0

## 2011-11-30 LAB — CBC
Hemoglobin: 8.1 g/dL — ABNORMAL LOW (ref 12.0–15.0)
MCH: 44.8 pg — ABNORMAL HIGH (ref 26.0–34.0)
Platelets: 384 10*3/uL (ref 150–400)
RBC: 1.81 MIL/uL — ABNORMAL LOW (ref 3.87–5.11)

## 2011-11-30 LAB — BASIC METABOLIC PANEL
BUN: 12 mg/dL (ref 6–23)
CO2: 25 mEq/L (ref 19–32)
GFR calc non Af Amer: 89 mL/min — ABNORMAL LOW (ref >90)
Glucose, Bld: 106 mg/dL — ABNORMAL HIGH (ref 70–99)
Potassium: 3.6 mEq/L (ref 3.5–5.1)
Sodium: 133 mEq/L — ABNORMAL LOW (ref 135–145)

## 2011-11-30 LAB — HEMOGLOBIN AND HEMATOCRIT, BLOOD: HCT: 27.3 % — ABNORMAL LOW (ref 36.0–46.0)

## 2011-11-30 MED ORDER — ACETAMINOPHEN 500 MG PO TABS
500.0000 mg | ORAL_TABLET | ORAL | Status: DC | PRN
  Administered 2011-11-30: 500 mg via ORAL
  Filled 2011-11-30: qty 1

## 2011-11-30 NOTE — Progress Notes (Signed)
Both units of prbc's have infused. No transfusion reaction.  Pt being transfered to to room 306. Vss. Pt alert and oriented. Denies any rle pain. neurochecks to rle are good. Ice pack in place to rt thigh. Foley catheter is now removed.o2 at 3l/min via [place]. I will continue  As this pt's nurse to the end of this 12hr shift.

## 2011-11-30 NOTE — Plan of Care (Signed)
Problem: Phase I Progression Outcomes Goal: Voiding-avoid urinary catheter unless indicated Outcome: Not Progressing Has foley but should be removed on post op day 1.

## 2011-11-30 NOTE — Progress Notes (Signed)
FIRST UNIT PRBC  TRANSFUSION STARTED.

## 2011-11-30 NOTE — Progress Notes (Signed)
Pt sat's decrease while asleep, o2 increased to 3lpm/

## 2011-11-30 NOTE — Progress Notes (Signed)
FIRST UNIT PRBC COMPLETED AT 1200. SECOND UNIT PRBC FOR TRANSFUSION STARTED. NO TRANSFUSION REACTION OBSERVED SO FAR.

## 2011-11-30 NOTE — Addendum Note (Signed)
Addendum  created 11/30/11 1308 by Ollen Bowl, CRNA   Modules edited:Notes Section

## 2011-11-30 NOTE — Progress Notes (Signed)
Subjective: This lady underwent surgery by Dr. Aline Brochure for right sided compartment syndrome. There was apparently significant amount of blood clot that he had to evacuate. She is now in the intensive care unit. She has done well overnight.           Physical Exam: Blood pressure 114/71, pulse 95, temperature 100.8 F (38.2 C), temperature source Axillary, resp. rate 19, height 6' (1.829 m), weight 98.1 kg (216 lb 4.3 oz), SpO2 98.00%. She looks systemically well. Her peripheries are warm and then they were yesterday. She remains any true fibrillation. Lung fields are clear. She is alert and orientated. Both her feet are warm, especially the right one. Pulses there are good.   Investigations:     Basic Metabolic Panel:  Basename 11/30/11 0429 11/29/11 0705  NA 133* 136  K 3.6 5.0  CL 101 103  CO2 25 27  GLUCOSE 106* 117*  BUN 12 16  CREATININE 0.64 0.64  CALCIUM 8.6 9.2  MG -- --  PHOS -- --   Liver Function Tests: No results found for this basename: AST:2,ALT:2,ALKPHOS:2,BILITOT:2,PROT:2,ALBUMIN:2 in the last 72 hours   CBC:  Basename 11/30/11 0429 11/29/11 0705 11/28/11 0837  WBC 4.4 6.7 --  NEUTROABS -- 5.1 5.1  HGB 8.1* 9.8* --  HCT 23.0* 29.0* --  MCV 127.1* 126.6* --  PLT 384 446* --    Dg Pelvis 1-2 Views  11/28/2011  *RADIOLOGY REPORT*  Clinical Data: Status post ORIF for right femoral neck fracture.  RIGHT HIP - COMPLETE 2+ VIEW  Comparison: 11/19/2011 and 11/16/2011.  Findings: Cannulated compression screw is identified in the right femoral neck, as before.  No evidence for hardware complication. The head of the most caudal screw has not been included on the film.  SI joints and symphysis pubis are unremarkable.  IMPRESSION: No complicating features identified on this film although the inferior most aspect of the orthopedic hardware has not been visualized.  Per CMS PQRS reporting requirements (PQRS Measure 24): Given the patient's age of greater than 23  and the fracture site (hip, distal radius, or spine), the patient should be tested for osteoporosis using DXA, and the appropriate treatment considered based on the DXA results.  Original Report Authenticated By: ERIC A. MANSELL, M.D.   Dg Femur Right  11/28/2011  *RADIOLOGY REPORT*  Clinical Data: Status post ORIF for hip fracture.  RIGHT FEMUR - 2 VIEW  Comparison: 11/19/2011.  Findings: The patient has had pain placement for right femoral neck fracture.  No evidence for hardware complications.  IMPRESSION: Status post ORIF for right femoral neck fracture.  No evidence for hardware complications.  Original Report Authenticated By: ERIC A. MANSELL, M.D.   US Venous Img Lower Unilateral Right  11/28/2011  *RADIOLOGY REPORT*  Clinical Data: Status post ORIF for right femoral neck fracture. Now with right lower extremity pain and swelling.  RIGHT LOWER EXTREMITY VENOUS DUPLEX ULTRASOUND  Technique: Gray-scale sonography with graded compression, as well as color Doppler and duplex ultrasound, were performed to evaluate the deep venous system of the right lower extremity from the level of the common femoral vein through the popliteal and proximal calf veins. Spectral Doppler was utilized to evaluate flow at rest and with distal augmentation maneuvers.  Comparison: No comparison studies available.  Findings: The right common femoral vein, superficial femoral vein, deep femoral vein, saphenofemoral junction, and popliteal vein show normal patent directional flow, normal phasicity, normal augmentation, and normal compression.  Flow signal is seen within the posterior  tibial and peroneal veins where visualized below the knee.  IMPRESSION: No evidence for DVT in the right lower extremity.  Original Report Authenticated By: ERIC A. MANSELL, M.D.   [place] Extrem Low Right Ltd  11/28/2011  *RADIOLOGY REPORT*  Clinical Data: Bruising to right medial thigh, evaluate for hematoma status post right hip surgery  ULTRASOUND  RIGHT LOWER EXTREMITY LIMITED  Technique:  Ultrasound examination of the region of interest in the right lower extremity was performed.  Comparison:  None.  Findings: Limited evaluation of the area of clinical concern (medial right thigh) demonstrates mild subcutaneous edema without organized fluid collection, abscess, or hematoma.  IMPRESSION: No hematoma is seen at the site of clinical concern (medial right thigh).  Original Report Authenticated By: Julian Hy, M.D.      Medications:  Scheduled:   . acetaminophen  1,000 mg Intravenous Once  . albuterol  2.5 mg Nebulization QID  .  ceFAZolin (ANCEF) IV  2 g Intravenous Q6H  . diltiazem  180 mg Oral Daily  . docusate sodium  100 mg Oral BID  . etomidate      . fentaNYL      . fentaNYL      . ferrous sulfate  325 mg Oral TID PC  . gabapentin  100 mg Oral TID  . hydroxyurea  1,000 mg Oral Daily  . hydroxyurea  500 mg Oral QHS  . levothyroxine  25 mcg Oral Daily  . lidocaine      . methocarbamol  500 mg Oral QID   Or  . methocarbamol (ROBAXIN) IV  500 mg Intravenous QID  . metoprolol      . metoprolol succinate  100 mg Oral BID  . midazolam      . ondansetron      . ondansetron  4 mg Intravenous Once  . oxyCODONE  5 mg Oral Q4H  . phytonadione (VITAMIN K) IV  5 mg Intravenous Once  . rocuronium      . senna  1 tablet Oral BID  . sodium chloride      . sodium chloride      . succinylcholine      . DISCONTD:  ceFAZolin (ANCEF) IV  2 g Intravenous 60 min Pre-Op  . DISCONTD: ondansetron (ZOFRAN) IV  4 mg Intravenous Once  . DISCONTD: oxyCODONE  5 mg Oral Q4H    Impression: 1. Acute blood loss anemia secondary to blood loss in the leg. 2. Compartment syndrome, status post fasciotomy and release. 3. Atrial fibrillation, anticoagulation held for the time being and reversed. 4. Polycythemia vera. 5. Hypertension, controlled.     Plan: 1. 2 units blood transfusion. 2. Decrease IV fluids a little. 3. She should be able  to move to the telemetry floor, if okay with Dr. Aline Brochure.     LOS: 2 days   Doree Albee Pager [phone]  11/30/2011, 7:35 AM

## 2011-11-30 NOTE — Progress Notes (Signed)
Subjective: 1 Day Post-Op Procedure(s) (LRB): FASCIOTOMY (Right) Patient reports pain as significantly improved .    Objective: Vital signs in last 24 hours: Temp:  [97.9 F (36.6 C)-100.8 F (38.2 C)] 100.8 F (38.2 C) (04/28 0400) Pulse Rate:  [79-108] 95  (04/28 0700) Resp:  [13-19] 19  (04/28 0700) BP: (89-146)/(46-90) 114/71 mmHg (04/28 0700) SpO2:  [90 %-100 %] 98 % (04/28 0700) Weight:  [94 kg (207 lb 3.7 oz)-98.1 kg (216 lb 4.3 oz)] 98.1 kg (216 lb 4.3 oz) (04/28 0500)  Intake/Output from previous day: 04/27 0701 - 04/28 0700 In: 5473 [P.O.:720; I.V.:2500; Blood:653; IV Piggyback:300] Out: 1975 [Urine:1825; Blood:150] Intake/Output this shift:     Basename 11/30/11 0429 11/29/11 0705 11/28/11 0837  HGB 8.1* 9.8* 11.4*    Basename 11/30/11 0429 11/29/11 0705  WBC 4.4 6.7  RBC 1.81* 2.29*  HCT 23.0* 29.0*  PLT 384 446*    Basename 11/30/11 0429 11/29/11 0705  NA 133* 136  K 3.6 5.0  CL 101 103  CO2 25 27  BUN 12 16  CREATININE 0.64 0.64  GLUCOSE 106* 117*  CALCIUM 8.6 9.2    Basename 11/30/11 0429 11/29/11 1235  LABPT -- --  INR 1.44 2.73*    Neurologically intact Neurovascular intact Sensation intact distally Intact pulses distally Dorsiflexion/Plantar flexion intact Compartment soft  Assessment/Plan: 1 Day Post-Op Procedure(s) (LRB): FASCIOTOMY (Right) Advance diet Transfer to floor telemetry Transfuse 2 units of blood   Arther Abbott 11/30/2011, 8:05 AM

## 2011-12-01 LAB — COMPREHENSIVE METABOLIC PANEL
ALT: 15 U/L (ref 0–35)
AST: 26 U/L (ref 0–37)
Albumin: 2.4 g/dL — ABNORMAL LOW (ref 3.5–5.2)
CO2: 24 mEq/L (ref 19–32)
Chloride: 99 mEq/L (ref 96–112)
Creatinine, Ser: 0.67 mg/dL (ref 0.50–1.10)
GFR calc non Af Amer: 88 mL/min — ABNORMAL LOW (ref >90)
Potassium: 3.6 mEq/L (ref 3.5–5.1)
Sodium: 131 mEq/L — ABNORMAL LOW (ref 135–145)
Total Bilirubin: 1.4 mg/dL — ABNORMAL HIGH (ref 0.3–1.2)

## 2011-12-01 LAB — CBC
Hemoglobin: 8.9 g/dL — ABNORMAL LOW (ref 12.0–15.0)
RBC: 2.31 MIL/uL — ABNORMAL LOW (ref 3.87–5.11)
WBC: 6.5 10*3/uL (ref 4.0–10.5)

## 2011-12-01 LAB — PROTIME-INR
INR: 1.52 — ABNORMAL HIGH (ref 0.00–1.49)
Prothrombin Time: 18.6 seconds — ABNORMAL HIGH (ref 11.6–15.2)

## 2011-12-01 MED ORDER — OXYCODONE HCL 5 MG PO TABS
5.0000 mg | ORAL_TABLET | ORAL | Status: DC | PRN
  Administered 2011-12-01 – 2011-12-05 (×4): 5 mg via ORAL
  Filled 2011-12-01 (×4): qty 1

## 2011-12-01 MED ORDER — CHLORHEXIDINE GLUCONATE 4 % EX LIQD
60.0000 mL | Freq: Once | CUTANEOUS | Status: DC
  Filled 2011-12-01: qty 60

## 2011-12-01 MED ORDER — METOPROLOL SUCCINATE ER 50 MG PO TB24
50.0000 mg | ORAL_TABLET | Freq: Two times a day (BID) | ORAL | Status: DC
  Administered 2011-12-01 – 2011-12-03 (×5): 50 mg via ORAL
  Filled 2011-12-01 (×5): qty 1

## 2011-12-01 NOTE — Progress Notes (Signed)
Subjective: This lady underwent surgery by Dr. Aline Brochure for right sided compartment syndrome. There was apparently significant amount of blood clot that he had to evacuate. She received 2 units of blood yesterday and post transfusion hemoglobin was still only 8.1! Her hemoglobin this morning is 8.9 and she is due to receive 2 further units of blood. She feels well, she is not short of breath.           Physical Exam: Blood pressure 105/69, pulse 93, temperature 99.4 F (37.4 C), temperature source Oral, resp. rate 20, height 6' (1.829 m), weight 98.1 kg (216 lb 4.3 oz), SpO2 93.00%. She looks systemically well. Her peripheries are warm and then they were yesterday. She remains in atrial fibrillation. Lung fields are clear. She is alert and orientated. Both her feet are warm, especially the right one. Pulses there are good.   Investigations:     Basic Metabolic Panel:  Basename 12/01/11 0506 11/30/11 0429  NA 131* 133*  K 3.6 3.6  CL 99 101  CO2 24 25  GLUCOSE 125* 106*  BUN 12 12  CREATININE 0.67 0.64  CALCIUM 8.6 8.6  MG -- --  PHOS -- --   Liver Function Tests:  [facility] 12/01/11 0506  AST 26  ALT 15  ALKPHOS 60  BILITOT 1.4*  PROT 5.3*  ALBUMIN 2.4*     CBC:  Basename 12/01/11 0506 11/30/11 1725 11/30/11 0429 11/29/11 0705 11/28/11 0837  WBC 6.5 -- 4.4 -- --  NEUTROABS -- -- -- 5.1 5.1  HGB 8.9* POST TRANSFUSION SPECIMEN -- -- --  HCT 26.3* 27.3* -- -- --  MCV 113.9* -- 127.1* -- --  PLT 320 -- 384 -- --    No results found.    Medications:  Scheduled:    . albuterol  2.5 mg Nebulization QID  . diltiazem  180 mg Oral Daily  . docusate sodium  100 mg Oral BID  . ferrous sulfate  325 mg Oral TID PC  . gabapentin  100 mg Oral TID  . hydroxyurea  1,000 mg Oral Daily  . hydroxyurea  500 mg Oral QHS  . levothyroxine  25 mcg Oral Daily  . methocarbamol  500 mg Oral QID   Or  . methocarbamol (ROBAXIN) IV  500 mg Intravenous QID  . metoprolol  succinate  50 mg Oral BID  . senna  1 tablet Oral BID  . DISCONTD: metoprolol succinate  100 mg Oral BID  . DISCONTD: oxyCODONE  5 mg Oral Q4H    Impression: 1. Acute blood loss anemia secondary to blood loss in the leg, status post 2 units blood transfusion, due to have further 2 units today. 2. Compartment syndrome, status post fasciotomy and release. 3. Atrial fibrillation, anticoagulation held for the time being and reversed. 4. Polycythemia vera. 5. Hypertension, blood pressure on the lower side.     Plan: 1. 2 units blood transfusion. 2. Reduce metoprolol dose in view of lower blood pressures.      LOS: 3 days   Doree Albee Pager [phone]  12/01/2011, 8:22 AM

## 2011-12-01 NOTE — Progress Notes (Signed)
Clinical Social Work Department BRIEF PSYCHOSOCIAL ASSESSMENT 12/01/2011  Patient:  Charlotte [name], Charlotte [name]     Account Number:  [id number]     Admit date:  11/28/2011  Clinical Social Worker:  Daiva Huge  Date/Time:  12/01/2011 11:45 AM  Referred by:  RN  Date Referred:  12/01/2011 Referred for  SNF Placement?   Other Referral:   Per report, patient admitted from a SNF but actually is from home.   Interview type:  Family Other interview type:   Stanley,Billy son- [phone]    PSYCHOSOCIAL DATA Living Status:  FAMILY Admitted from facility:   Level of care:   Primary support name:  Billy son and husband Primary support relationship to patient:  FAMILY Degree of support available:   good    CURRENT CONCERNS Current Concerns  Post-Acute Placement   Other Concerns:    SOCIAL WORK ASSESSMENT / PLAN Son states the patient did not come to [place] from a SNF- has been living at home with her husband and a son- [facility] is active and providing [place] care- Son denies any additional needs and declines interest in SNF.   Assessment/plan status:  Other - See comment Other assessment/ plan:   RNCM advised for HH/DME arrangements at d/c.   Information/referral to community resources:    PATIENT'S/FAMILY'S RESPONSE TO PLAN OF CARE: Son and patient agreeable to these plans-     Eduard Clos, MSW, [place]

## 2011-12-01 NOTE — Progress Notes (Signed)
UR Chart Review Completed  

## 2011-12-01 NOTE — Evaluation (Signed)
Physical Therapy Evaluation Patient Details Name: Charlotte [name] MRN: [id number] DOB: [birth year] Today's Date: 12/01/2011 Time: [id number] PT Time Calculation (min): 28 min  PT Assessment / Plan / Recommendation Clinical Impression  Pt now with very limited mobility due to recent R thigh surgery.  She remains very cooperative and well motivated, pain is well controlled until she tries to move.  She was premedicated for PT visit but c/o significant pain in R thigh during movement of Leg OOB.  Mod assist was required for transfer bed to chair using a walker.  She as able to tolerate some weight on RLE.  Will follow along 2x daily for transfers until orders  are updated.    PT Assessment  Patient needs continued PT services    Follow Up Recommendations  Home health PT    Equipment Recommendations  Defer to next venue    Frequency 7X/week    Precautions / Restrictions Precautions Precautions: Fall Restrictions Weight Bearing Restrictions: No RLE Weight Bearing: Weight bearing as tolerated   Pertinent Vitals/Pain       Mobility  Bed Mobility Bed Mobility: Supine to Sit;Sit to Supine Supine to Sit: 3: Mod assist;HOB elevated Details for Bed Mobility Assistance: unable to move RLE in the bed Transfers Transfers: Sit to Stand;Stand to Sit Sit to Stand: 3: Mod assist;From elevated surface;With upper extremity assist Stand to Sit: 4: Min assist;With upper extremity assist;To chair/3-in-1 Ambulation/Gait Ambulation/Gait Assistance: Not tested (comment) (not ordered)    Exercises     PT Goals Acute Rehab PT Goals PT Goal Formulation: With patient Time For Goal Achievement: 12/08/11 Potential to Achieve Goals: Good Pt will go Supine/Side to Sit: with min assist PT Goal: Supine/Side to Sit - Progress: Goal set today Pt will go Sit to Supine/Side: with min assist PT Goal: Sit to Supine/Side - Progress: Goal set today Pt will go Sit to Stand: with supervision PT Goal: Sit to  Stand - Progress: Goal set today Pt will go Stand to Sit: with supervision PT Goal: Stand to Sit - Progress: Goal set today Pt will Transfer Bed to Chair/Chair to Bed: with min assist PT Transfer Goal: Bed to Chair/Chair to Bed - Progress: Goal set today PT Goal: Ambulate - Progress: Discontinued (comment) (no gait ordered) PT Goal: Up/Down Stairs - Progress: Discontinued (comment)  Visit Information  Last PT Received On: 12/01/11    Subjective Data  Subjective: I feel OK Patient Stated Goal: none stated   Prior Functioning  Communication Communication: HOH    Cognition  Overall Cognitive Status: Appears within functional limits for tasks assessed/performed Arousal/Alertness: Awake/alert Orientation Level: Appears intact for tasks assessed Behavior During Session: [facility] for tasks performed    Extremity/Trunk Assessment Right Upper Extremity Assessment RUE ROM/Strength/Tone: Within functional levels Left Upper Extremity Assessment LUE ROM/Strength/Tone: Within functional levels Right Lower Extremity Assessment RLE ROM/Strength/Tone:  (R thigh wrapped in Ace- unable to flex knee) Left Lower Extremity Assessment LLE ROM/Strength/Tone: Within functional levels LLE Sensation: WFL - Light Touch;WFL - Proprioception LLE Coordination: WFL - gross motor   Balance Balance Balance Assessed: No  End of Session PT - End of Session Equipment Utilized During Treatment: Gait belt Activity Tolerance: Patient tolerated treatment well Patient left: in chair;with call bell/phone within reach Nurse Communication: Mobility status   Sable Feil 12/01/2011, 10:10 AM

## 2011-12-01 NOTE — Progress Notes (Signed)
Physical Therapy Treatment Patient Details Name: Charlotte [name] MRN: [id number] DOB: May 23, 1941 Today's Date: 12/01/2011 Time: [id number] PT Time Calculation (min): 21 min  PT Assessment / Plan / Recommendation Comments on Treatment Session  Pt currently receiving blood transfusion.  Instructed in chair to [facility] to Bed transfer with walker, mod assist.  Fatigued.  Pt states that she is to have more surgery tomorrow.    Follow Up Recommendations  Home health PT    Equipment Recommendations  Defer to next venue    Frequency 7X/week   Plan      Precautions / Restrictions Precautions Precautions: Fall Restrictions Weight Bearing Restrictions: Yes RLE Weight Bearing: Weight bearing as tolerated   Pertinent Vitals/Pain     Mobility  Bed Mobility Bed Mobility: Supine to Sit;Sit to Supine Supine to Sit: 3: Mod assist;HOB elevated Sit to Supine: 3: Mod assist Details for Bed Mobility Assistance: unable to move RLE in the bed Transfers Transfers: Squat Pivot Transfers Sit to Stand: 3: Mod assist;With upper extremity assist;From chair/3-in-1 Stand to Sit: 4: Min assist;With upper extremity assist Squat Pivot Transfers: 3: Mod assist;With upper extremity assistance Ambulation/Gait Ambulation/Gait Assistance: Not tested (comment) (not ordered)    Exercises     PT Goals Acute Rehab PT Goals PT Goal Formulation: With patient Time For Goal Achievement: 12/08/11 Potential to Achieve Goals: Good Pt will go Supine/Side to Sit: with min assist PT Goal: Supine/Side to Sit - Progress: Progressing toward goal Pt will go Sit to Supine/Side: with min assist PT Goal: Sit to Supine/Side - Progress: Progressing toward goal Pt will go Sit to Stand: with supervision PT Goal: Sit to Stand - Progress: Progressing toward goal Pt will go Stand to Sit: with supervision PT Goal: Stand to Sit - Progress: Progressing toward goal Pt will Transfer Bed to Chair/Chair to Bed: with min assist PT  Transfer Goal: Bed to Chair/Chair to Bed - Progress: Progressing toward goal PT Goal: Ambulate - Progress: Discontinued (comment) (no gait ordered) PT Goal: Up/Down Stairs - Progress: Discontinued (comment)  Visit Information  Last PT Received On: 12/01/11    Subjective Data  Subjective: I feel OK Patient Stated Goal: none stated   Cognition  Overall Cognitive Status: Appears within functional limits for tasks assessed/performed Arousal/Alertness: Awake/alert Orientation Level: Appears intact for tasks assessed Behavior During Session: [facility] for tasks performed    Balance  Balance Balance Assessed: No  End of Session PT - End of Session Equipment Utilized During Treatment: Gait belt Activity Tolerance: Patient tolerated treatment well Patient left: in bed;with call bell/phone within reach Nurse Communication: Mobility status    Sable Feil 12/01/2011, 1:26 PM

## 2011-12-02 ENCOUNTER — Encounter (HOSPITAL_COMMUNITY)
Admission: EM | Disposition: A | Discharge status: Home Health | Payer: Self-pay | Source: Home / Self Care | Attending: Orthopedic Surgery

## 2011-12-02 ENCOUNTER — Inpatient Hospital Stay (HOSPITAL_COMMUNITY): Payer: Medicare Other | Admitting: Anesthesiology

## 2011-12-02 ENCOUNTER — Inpatient Hospital Stay (HOSPITAL_COMMUNITY): Payer: Medicare Other

## 2011-12-02 ENCOUNTER — Encounter (HOSPITAL_COMMUNITY): Payer: Self-pay | Admitting: Orthopedic Surgery

## 2011-12-02 ENCOUNTER — Encounter (HOSPITAL_COMMUNITY): Payer: Self-pay | Admitting: Anesthesiology

## 2011-12-02 HISTORY — PX: DRESSING CHANGE UNDER ANESTHESIA: SHX5237

## 2011-12-02 LAB — CBC
MCV: 107.4 fL — ABNORMAL HIGH (ref 78.0–100.0)
Platelets: 341 10*3/uL (ref 150–400)
RBC: 2.96 MIL/uL — ABNORMAL LOW (ref 3.87–5.11)
WBC: 6 10*3/uL (ref 4.0–10.5)

## 2011-12-02 LAB — BASIC METABOLIC PANEL
CO2: 24 mEq/L (ref 19–32)
Chloride: 100 mEq/L (ref 96–112)
Creatinine, Ser: 0.63 mg/dL (ref 0.50–1.10)
Sodium: 134 mEq/L — ABNORMAL LOW (ref 135–145)

## 2011-12-02 LAB — PROTIME-INR
INR: 1.44 (ref 0.00–1.49)
Prothrombin Time: 17.8 seconds — ABNORMAL HIGH (ref 11.6–15.2)

## 2011-12-02 SURGERY — FASCIOTOMY CLOSURE
Anesthesia: General | Site: Thigh | Laterality: Right | Wound class: Dirty or Infected

## 2011-12-02 MED ORDER — MIDAZOLAM HCL 2 MG/2ML IJ SOLN
INTRAMUSCULAR | Status: AC
  Administered 2011-12-02: 2 mg via INTRAVENOUS
  Filled 2011-12-02: qty 2

## 2011-12-02 MED ORDER — ACETAMINOPHEN 10 MG/ML IV SOLN
INTRAVENOUS | Status: AC
  Administered 2011-12-02: 1000 mg via INTRAVENOUS
  Filled 2011-12-02: qty 100

## 2011-12-02 MED ORDER — PROPOFOL 10 MG/ML IV EMUL
INTRAVENOUS | Status: AC
  Filled 2011-12-02: qty 20

## 2011-12-02 MED ORDER — TRAMADOL HCL 50 MG PO TABS
50.0000 mg | ORAL_TABLET | Freq: Once | ORAL | Status: AC
  Administered 2011-12-02: 50 mg via ORAL

## 2011-12-02 MED ORDER — FENTANYL CITRATE 0.05 MG/ML IJ SOLN
INTRAMUSCULAR | Status: AC
  Administered 2011-12-02: 25 ug via INTRAVENOUS
  Filled 2011-12-02: qty 2

## 2011-12-02 MED ORDER — ONDANSETRON HCL 4 MG/2ML IJ SOLN: 4.0000 mg | Freq: Once | INTRAMUSCULAR | Status: DC | PRN

## 2011-12-02 MED ORDER — SODIUM CHLORIDE 0.9 % IV SOLN
INTRAVENOUS | Status: DC
  Administered 2011-12-02: 18:00:00 via INTRAVENOUS
  Administered 2011-12-03: 1000 mL via INTRAVENOUS

## 2011-12-02 MED ORDER — ACETAMINOPHEN 10 MG/ML IV SOLN
1000.0000 mg | Freq: Four times a day (QID) | INTRAVENOUS | Status: AC
  Administered 2011-12-02 – 2011-12-03 (×3): 1000 mg via INTRAVENOUS
  Filled 2011-12-02 (×3): qty 100

## 2011-12-02 MED ORDER — PROPOFOL 10 MG/ML IV BOLUS
INTRAVENOUS | Status: DC | PRN
  Administered 2011-12-02: 150 mg via INTRAVENOUS

## 2011-12-02 MED ORDER — LIDOCAINE HCL (PF) 1 % IJ SOLN
INTRAMUSCULAR | Status: AC
  Filled 2011-12-02: qty 5

## 2011-12-02 MED ORDER — FENTANYL CITRATE 0.05 MG/ML IJ SOLN
INTRAMUSCULAR | Status: DC | PRN
  Administered 2011-12-02 (×2): 50 ug via INTRAVENOUS

## 2011-12-02 MED ORDER — FENTANYL CITRATE 0.05 MG/ML IJ SOLN
25.0000 ug | INTRAMUSCULAR | Status: DC | PRN
  Administered 2011-12-02: 25 ug via INTRAVENOUS
  Administered 2011-12-02: 50 ug via INTRAVENOUS
  Administered 2011-12-02: 25 ug via INTRAVENOUS

## 2011-12-02 MED ORDER — LACTATED RINGERS IV SOLN
INTRAVENOUS | Status: DC
  Administered 2011-12-02: 1000 mL via INTRAVENOUS

## 2011-12-02 MED ORDER — SODIUM CHLORIDE 0.9 % IR SOLN
Status: DC | PRN
  Administered 2011-12-02: 1000 mL

## 2011-12-02 MED ORDER — ACETAMINOPHEN 10 MG/ML IV SOLN
1000.0000 mg | Freq: Once | INTRAVENOUS | Status: AC
  Administered 2011-12-02: 1000 mg via INTRAVENOUS

## 2011-12-02 MED ORDER — FENTANYL CITRATE 0.05 MG/ML IJ SOLN
INTRAMUSCULAR | Status: AC
  Administered 2011-12-02: 50 ug via INTRAVENOUS
  Filled 2011-12-02: qty 2

## 2011-12-02 MED ORDER — ONDANSETRON HCL 4 MG/2ML IJ SOLN
4.0000 mg | Freq: Once | INTRAMUSCULAR | Status: AC
  Administered 2011-12-02: 4 mg via INTRAVENOUS

## 2011-12-02 MED ORDER — MIDAZOLAM HCL 5 MG/5ML IJ SOLN
INTRAMUSCULAR | Status: DC | PRN
  Administered 2011-12-02: 2 mg via INTRAVENOUS

## 2011-12-02 MED ORDER — ONDANSETRON HCL 4 MG/2ML IJ SOLN
INTRAMUSCULAR | Status: AC
  Administered 2011-12-02: 4 mg via INTRAVENOUS
  Filled 2011-12-02: qty 2

## 2011-12-02 MED ORDER — MIDAZOLAM HCL 2 MG/2ML IJ SOLN
1.0000 mg | INTRAMUSCULAR | Status: DC | PRN
Start: 2011-12-02 — End: 2011-12-02
  Administered 2011-12-02: 2 mg via INTRAVENOUS

## 2011-12-02 MED ORDER — TRAMADOL HCL 50 MG PO TABS
ORAL_TABLET | ORAL | Status: AC
  Administered 2011-12-02: 50 mg via ORAL
  Filled 2011-12-02: qty 1

## 2011-12-02 MED ORDER — LIDOCAINE HCL 1 % IJ SOLN
INTRAMUSCULAR | Status: DC | PRN
  Administered 2011-12-02: 50 mg via INTRADERMAL

## 2011-12-02 MED ORDER — MIDAZOLAM HCL 2 MG/2ML IJ SOLN
INTRAMUSCULAR | Status: AC
  Filled 2011-12-02: qty 2

## 2011-12-02 SURGICAL SUPPLY — 49 items
BAG HAMPER (MISCELLANEOUS) ×3 IMPLANT
BANDAGE ELASTIC 4 VELCRO NS (GAUZE/BANDAGES/DRESSINGS) IMPLANT
BANDAGE ELASTIC 6 VELCRO NS (GAUZE/BANDAGES/DRESSINGS) ×3 IMPLANT
BANDAGE ESMARK 4X12 BL STRL LF (DISPOSABLE) ×2 IMPLANT
BANDAGE GAUZE ELAST BULKY 4 IN (GAUZE/BANDAGES/DRESSINGS) IMPLANT
BLADE HEX COATED 2.75 (ELECTRODE) ×3 IMPLANT
BNDG COHESIVE 4X5 TAN NS LF (GAUZE/BANDAGES/DRESSINGS) ×3 IMPLANT
BNDG ESMARK 4X12 BLUE STRL LF (DISPOSABLE) ×3
CLOTH BEACON ORANGE TIMEOUT ST (SAFETY) ×3 IMPLANT
COVER SURGICAL LIGHT HANDLE (MISCELLANEOUS) ×6 IMPLANT
CUFF TOURNIQUET SINGLE 18IN (TOURNIQUET CUFF) IMPLANT
DECANTER SPIKE VIAL GLASS SM (MISCELLANEOUS) IMPLANT
DRSG XEROFORM 1X8 (GAUZE/BANDAGES/DRESSINGS) ×3 IMPLANT
ELECT REM PT RETURN 9FT ADLT (ELECTROSURGICAL) ×3
ELECTRODE REM PT RTRN 9FT ADLT (ELECTROSURGICAL) ×2 IMPLANT
EVACUATOR 3/16  PVC DRAIN (DRAIN) ×1
EVACUATOR 3/16 PVC DRAIN (DRAIN) ×2 IMPLANT
GAUZE XEROFORM 5X9 LF (GAUZE/BANDAGES/DRESSINGS) ×3 IMPLANT
GLOVE SKINSENSE NS SZ8.0 LF (GLOVE) ×1
GLOVE SKINSENSE STRL SZ8.0 LF (GLOVE) ×2 IMPLANT
GLOVE SS N UNI LF 8.5 STRL (GLOVE) ×3 IMPLANT
GOWN STRL REIN XL XLG (GOWN DISPOSABLE) ×6 IMPLANT
HANDPIECE INTERPULSE COAX TIP (DISPOSABLE)
INST SET MINOR BONE (KITS) ×3 IMPLANT
IV NS IRRIG 3000ML ARTHROMATIC (IV SOLUTION) IMPLANT
KIT ROOM TURNOVER APOR (KITS) ×3 IMPLANT
MANIFOLD NEPTUNE II (INSTRUMENTS) ×3 IMPLANT
NS IRRIG 1000ML POUR BTL (IV SOLUTION) ×6 IMPLANT
PACK BASIC LIMB (CUSTOM PROCEDURE TRAY) ×3 IMPLANT
PAD ABD 5X9 TENDERSORB (GAUZE/BANDAGES/DRESSINGS) ×3 IMPLANT
PAD ARMBOARD 7.5X6 YLW CONV (MISCELLANEOUS) ×3 IMPLANT
PAD CAST 4YDX4 CTTN HI CHSV (CAST SUPPLIES) ×2 IMPLANT
PADDING CAST COTTON 4X4 STRL (CAST SUPPLIES) ×1
PADDING CAST COTTON 6X4 STRL (CAST SUPPLIES) ×3 IMPLANT
SET BASIN LINEN APH (SET/KITS/TRAYS/PACK) ×3 IMPLANT
SET HNDPC FAN SPRY TIP SCT (DISPOSABLE) IMPLANT
SOL PREP PROV IODINE SCRUB 4OZ (MISCELLANEOUS) ×3 IMPLANT
SPONGE DRAIN TRACH 4X4 STRL 2S (GAUZE/BANDAGES/DRESSINGS) ×3 IMPLANT
SPONGE GAUZE 4X4 12PLY (GAUZE/BANDAGES/DRESSINGS) ×3 IMPLANT
SPONGE LAP 18X18 X RAY DECT (DISPOSABLE) ×6 IMPLANT
STAPLER VISISTAT 35W (STAPLE) ×3 IMPLANT
SUT ETHILON 3 0 FSL (SUTURE) ×3 IMPLANT
SUT MNCRL 0 VIOLET CTX 36 (SUTURE) ×6 IMPLANT
SUT MON AB 0 CT1 (SUTURE) ×3 IMPLANT
SUT MON AB 2-0 CT1 36 (SUTURE) ×3 IMPLANT
SUT MONOCRYL 0 CTX 36 (SUTURE) ×3
SWAB CULTURE LIQ STUART DBL (MISCELLANEOUS) IMPLANT
SYR BULB IRRIGATION 50ML (SYRINGE) ×3 IMPLANT
TUBE ANAEROBIC PORT A CUL  W/M (MISCELLANEOUS) IMPLANT

## 2011-12-02 NOTE — Progress Notes (Signed)
Patient ID: Charlotte [name], female   DOB: 1941-07-25, 71 y.o.   MRN: [id number] The patient has been scheduled for surgery to change the packing and closed the wound of the right thigh status post right thigh fasciotomy

## 2011-12-02 NOTE — Progress Notes (Signed)
   CARE MANAGEMENT NOTE 12/02/2011  Patient:  Charlotte [name], Charlotte [name]   Account Number:  [id number]  Date Initiated:  12/01/2011  Documentation initiated by:  Vladimir Creeks  Subjective/Objective Assessment:   Pt admitted with compartment syndrome, and had surgical intervention, with wound left open. She had a fx hip and surgery 2-3 weeks ago, and was doing well at home with [facility] until 4/26.     Action/Plan:   Plan is to return home with spouse and son, with [facility], if does well after the surgery to close the wound on 12/02/11   Anticipated DC Date:  12/04/2011   Anticipated DC Plan:  [place]  CM consult      [place]   Choice offered to / List presented to:  [name]        [place] arranged  HH-1 RN  [place].   Status of service:  In process, will continue to follow Medicare Important Message given?   (If response is "NO", the following Medicare IM given date fields will be blank) Date Medicare IM given:   Date Additional Medicare IM given:    Discharge Disposition:    Per UR Regulation:  Reviewed for med. necessity/level of care/duration of stay  If discussed at [place] of Stay Meetings, dates discussed:    Comments:  12/01/11 [place]

## 2011-12-02 NOTE — Transfer of Care (Signed)
Immediate Anesthesia Transfer of Care Note  Patient: Charlotte [name]  Procedure(s) Performed: Procedure(s) (LRB): FASCIOTOMY CLOSURE (Right)  Patient Location: PACU  Anesthesia Type: General  Level of Consciousness: awake and patient cooperative  Airway & Oxygen Therapy: Patient Spontanous Breathing and Patient connected to face mask oxygen  Post-op Assessment: Report given to PACU RN, Post -op Vital signs reviewed and stable and Patient moving all extremities  Post vital signs: Reviewed and stable  Complications: No apparent anesthesia complications

## 2011-12-02 NOTE — Anesthesia Preprocedure Evaluation (Signed)
Anesthesia Evaluation  Patient identified by MRN, date of birth, ID band Patient awake    Reviewed: Allergy & Precautions, H&P , NPO status , Patient's Chart, lab work & pertinent test results, reviewed documented beta blocker date and time   History of Anesthesia Complications Negative for: history of anesthetic complications  Airway Mallampati: I  Neck ROM: Full    Dental  (+) Edentulous Upper and Edentulous Lower   Pulmonary  breath sounds clear to auscultation        Cardiovascular hypertension, Pt. on medications + dysrhythmias Atrial Fibrillation Rhythm:Irregular Rate:Normal     Neuro/Psych  Neuromuscular disease    GI/Hepatic   Endo/Other  Hypothyroidism   Renal/GU      Musculoskeletal   Abdominal   Peds  Hematology  (+) Blood dyscrasia (polycythemia Vera), ,   Anesthesia Other Findings   Reproductive/Obstetrics                           Anesthesia Physical Anesthesia Plan  ASA: III  Anesthesia Plan: General   Post-op Pain Management:    Induction:   Airway Management Planned: LMA  Additional Equipment:   Intra-op Plan:   Post-operative Plan: Extubation in OR  Informed Consent: I have reviewed the patients History and Physical, chart, labs and discussed the procedure including the risks, benefits and alternatives for the proposed anesthesia with the patient or authorized representative who has indicated his/her understanding and acceptance.     Plan Discussed with:   Anesthesia Plan Comments:         Anesthesia Quick Evaluation

## 2011-12-02 NOTE — Op Note (Signed)
11/28/2011 - 12/02/2011  2:39 PM  PATIENT:  Charlotte [name]  71 y.o. female  PRE-OPERATIVE DIAGNOSIS:  status post fasciotomy right thigh  POST-OPERATIVE DIAGNOSIS:  status post fasciotomy right thigh  PROCEDURE:  Procedure(s) (LRB): FASCIOTOMY CLOSURE (Right)  FINDINGS: 3 cm of poorly contracting muscle and ~ 100c of blood clot, clean wound   DETAILS: After proper site marking and chart update, the consent was signed  The patient was taken back to the operating room for general anesthesia  The right lower extremity was prepped and draped in sterile technique  The timeout was completed  The wound was opened through the previous incision the packing was removed, the old sutures were taken out. The wound is explored. 100 cc of blood clot were removed  The vastus medialis muscle was tested for contractility, color, blood flow and consistency. Approximately 3 cm of the muscle was removed. The remaining musculature was contractile and had good color and bleeding.  The wound was then irrigated and closed with 0 Monocryl, 2-0 Monocryl and staples over a suction drain.  After 48 hours anticoagulation can be restarted  SURGEON:  Surgeon(s) and Role:    * Carole Civil, MD - Primary  PHYSICIAN ASSISTANT:   ASSISTANTS: none   ANESTHESIA:   general  EBL:  Total I/O In: 700 [I.V.:700] Out: 100 [Blood:100]  BLOOD ADMINISTERED:none  DRAINS: (1) Hemovact drain(s) in the right thigh  with  Suction Open   LOCAL MEDICATIONS USED:  NONE  SPECIMEN:  No Specimen  DISPOSITION OF SPECIMEN:  N/A  COUNTS:  YES  TOURNIQUET:  * No tourniquets in log *  DICTATION: .Dragon Dictation  PLAN OF CARE: Admit to inpatient   PATIENT DISPOSITION:  PACU - hemodynamically stable.   Delay start of Pharmacological VTE agent (>24hrs) due to surgical blood loss or risk of bleeding: yes

## 2011-12-02 NOTE — Interval H&P Note (Signed)
History and Physical Interval Note:  12/02/2011 1:02 PM  Charlotte [name]  has presented today for surgery, with the diagnosis of status post fasciotomy right thigh  The various methods of treatment have been discussed with the patient and family. After consideration of risks, benefits and other options for treatment, the patient has consented to  Procedure(s) (LRB):dressing change and wound closure / INCISION AND DRAINAGE WOUND WITH FASCIOTOMY (Right) as a surgical intervention .  The patients' history has been reviewed, patient examined, no change in status, stable for surgery.  I have reviewed the patients' chart and labs.  Questions were answered to the patient's satisfaction.     Arther Abbott

## 2011-12-02 NOTE — Brief Op Note (Addendum)
11/28/2011 - 12/02/2011  2:39 PM  PATIENT:  Charlotte [name]  71 y.o. female  PRE-OPERATIVE DIAGNOSIS:  status post fasciotomy right thigh  POST-OPERATIVE DIAGNOSIS:  status post fasciotomy right thigh  PROCEDURE:  Procedure(s) (LRB): FASCIOTOMY CLOSURE (Right)  3 cm of poorly contracting muscle and ~ 100c of blood clot, clean wound   SURGEON:  Surgeon(s) and Role:    * Carole Civil, MD - Primary  PHYSICIAN ASSISTANT:   ASSISTANTS: none   ANESTHESIA:   general  EBL:  Total I/O In: 700 [I.V.:700] Out: 100 [Blood:100]  BLOOD ADMINISTERED:none  DRAINS: (1) Hemovact drain(s) in the right thigh  with  Suction Open   LOCAL MEDICATIONS USED:  NONE  SPECIMEN:  No Specimen  DISPOSITION OF SPECIMEN:  N/A  COUNTS:  YES  TOURNIQUET:  * No tourniquets in log *  DICTATION: .Dragon Dictation  PLAN OF CARE: Admit to inpatient   PATIENT DISPOSITION:  PACU - hemodynamically stable.   Delay start of Pharmacological VTE agent (>24hrs) due to surgical blood loss or risk of bleeding: yes

## 2011-12-02 NOTE — Progress Notes (Signed)
Subjective: This lady feels well after another 2 units of blood transfusion. She continues on oxygen and I'm not sure why. She denies any dyspnea.           Physical Exam: Blood pressure 145/92, pulse 95, temperature 97.9 F (36.6 C), temperature source Oral, resp. rate 18, height 6' (1.829 m), weight 98.1 kg (216 lb 4.3 oz), SpO2 91.00%. She looks systemically well. Her peripheries are warm and then they were yesterday. She remains in atrial fibrillation. Lung fields are clear. She is alert and orientated. Both her feet are warm, especially the right one. Pulses there are good.   Investigations:     Basic Metabolic Panel:  Basename 12/02/11 0530 12/01/11 0506  NA 134* 131*  K 3.9 3.6  CL 100 99  CO2 24 24  GLUCOSE 101* 125*  BUN 13 12  CREATININE 0.63 0.67  CALCIUM 9.1 8.6  MG -- --  PHOS -- --   Liver Function Tests:  [facility] 12/01/11 0506  AST 26  ALT 15  ALKPHOS 60  BILITOT 1.4*  PROT 5.3*  ALBUMIN 2.4*     CBC:  Basename 12/02/11 0530 12/01/11 0506  WBC 6.0 6.5  NEUTROABS -- --  HGB 10.9* 8.9*  HCT 31.8* 26.3*  MCV 107.4* 113.9*  PLT 341 320    No results found.    Medications:  Scheduled:    . albuterol  2.5 mg Nebulization QID  . chlorhexidine  60 mL Topical Once  . chlorhexidine  60 mL Topical Once  . diltiazem  180 mg Oral Daily  . docusate sodium  100 mg Oral BID  . ferrous sulfate  325 mg Oral TID PC  . gabapentin  100 mg Oral TID  . hydroxyurea  1,000 mg Oral Daily  . hydroxyurea  500 mg Oral QHS  . levothyroxine  25 mcg Oral Daily  . methocarbamol  500 mg Oral QID   Or  . methocarbamol (ROBAXIN) IV  500 mg Intravenous QID  . metoprolol succinate  50 mg Oral BID  . senna  1 tablet Oral BID    Impression: 1. Acute blood loss anemia secondary to blood loss in the leg, status post 4 units blood transfusion. 2. Compartment syndrome, status post fasciotomy and release. 3. Atrial fibrillation, anticoagulation held for the  time being and reversed. 4. Polycythemia vera. 5. Hypertension, blood pressure on the lower side.     Plan: 1. Continue with current therapy. 2. Chest x-ray today and try and wean off oxygen..      LOS: 4 days   Doree Albee Pager [phone]  12/02/2011, 8:18 AM

## 2011-12-02 NOTE — Anesthesia Postprocedure Evaluation (Signed)
  Anesthesia Post-op Note  Patient: Charlotte [name]  Procedure(s) Performed: Procedure(s) (LRB): FASCIOTOMY CLOSURE (Right)  Patient Location: PACU  Anesthesia Type: General  Level of Consciousness: awake, alert , oriented and patient cooperative  Airway and Oxygen Therapy: Patient Spontanous Breathing  Post-op Pain: none  Post-op Assessment: Post-op Vital signs reviewed, Patient's Cardiovascular Status Stable, Respiratory Function Stable, RESPIRATORY FUNCTION UNSTABLE, No signs of Nausea or vomiting and Pain level controlled  Post-op Vital Signs: Reviewed and stable  Complications: No apparent anesthesia complications

## 2011-12-02 NOTE — H&P (View-Only) (Signed)
Subjective: Right thigh pain x since last night after PT with the stairs  Obvious bleed into right thigh    Objective: Vital signs in last 24 hours: Temp:  [97.7 F (36.5 C)] 97.7 F (36.5 C) (04/26 0713) Pulse Rate:  [96-108] 108  (04/26 1004) Resp:  [20-22] 22  (04/26 1004) BP: (142-183)/(100-103) 183/103 mmHg (04/26 1004) SpO2:  [94 %-98 %] 94 % (04/26 1004) Weight:  [196 lb (88.905 kg)] 196 lb (88.905 kg) (04/26 0713)  Intake/Output from previous day:   Intake/Output this shift:     Basename 11/28/11 0837  HGB 11.4*    Basename 11/28/11 0837  WBC 6.2  RBC 2.74*  HCT 33.6*  PLT 477*    Basename 11/28/11 0837  NA 136  K 4.9  CL 100  CO2 25  BUN 20  CREATININE 0.83  GLUCOSE 118*  CALCIUM 10.3    Basename 11/28/11 0837  LABPT --  INR 2.14*    Neurologically intact Sensation intact distally Intact pulses distally Dorsiflexion/Plantar flexion intact Compartment soft  but tender anterior thigh compartment   Assessment/Plan: Observation , pain control  No anticoagulants    Charlotte [name] 11/28/2011, 3:24 PM

## 2011-12-02 NOTE — Anesthesia Procedure Notes (Signed)
Procedure Name: LMA Insertion Date/Time: 12/02/2011 1:26 PM Performed by: Charmaine Downs Pre-anesthesia Checklist: Emergency Drugs available, Patient identified, Suction available and Patient being monitored Patient Re-evaluated:Patient Re-evaluated prior to inductionOxygen Delivery Method: Circle system utilized Preoxygenation: Pre-oxygenation with 100% oxygen Intubation Type: IV induction Ventilation: Mask ventilation without difficulty LMA Size: 3.0 Tube type: Oral Number of attempts: 2 Placement Confirmation: positive ETCO2 and breath sounds checked- equal and bilateral Tube secured with: Tape Dental Injury: Teeth and Oropharynx as per pre-operative assessment

## 2011-12-03 DIAGNOSIS — I4891 Unspecified atrial fibrillation: Secondary | ICD-10-CM

## 2011-12-03 DIAGNOSIS — Z7901 Long term (current) use of anticoagulants: Secondary | ICD-10-CM

## 2011-12-03 DIAGNOSIS — R112 Nausea with vomiting, unspecified: Secondary | ICD-10-CM

## 2011-12-03 DIAGNOSIS — I10 Essential (primary) hypertension: Secondary | ICD-10-CM

## 2011-12-03 LAB — TYPE AND SCREEN
ABO/RH(D): A POS
Unit division: 0
Unit division: 0
Unit division: 0

## 2011-12-03 LAB — BASIC METABOLIC PANEL
BUN: 11 mg/dL (ref 6–23)
CO2: 24 mEq/L (ref 19–32)
Chloride: 102 mEq/L (ref 96–112)
Creatinine, Ser: 0.52 mg/dL (ref 0.50–1.10)
GFR calc Af Amer: >90 mL/min (ref >90)
Potassium: 3.6 mEq/L (ref 3.5–5.1)

## 2011-12-03 LAB — PROTIME-INR: Prothrombin Time: 18.4 seconds — ABNORMAL HIGH (ref 11.6–15.2)

## 2011-12-03 LAB — CBC
HCT: 31.4 % — ABNORMAL LOW (ref 36.0–46.0)
MCV: 107.9 fL — ABNORMAL HIGH (ref 78.0–100.0)
Platelets: 311 10*3/uL (ref 150–400)
RBC: 2.91 MIL/uL — ABNORMAL LOW (ref 3.87–5.11)
WBC: 5.3 10*3/uL (ref 4.0–10.5)

## 2011-12-03 MED ORDER — SODIUM CHLORIDE 0.9 % IJ SOLN
INTRAMUSCULAR | Status: AC
  Administered 2011-12-03: 10 mL
  Filled 2011-12-03: qty 3

## 2011-12-03 MED ORDER — BIOTENE DRY MOUTH MT LIQD
15.0000 mL | Freq: Two times a day (BID) | OROMUCOSAL | Status: DC
  Administered 2011-12-03 – 2011-12-08 (×10): 15 mL via OROMUCOSAL

## 2011-12-03 MED ORDER — FUROSEMIDE 10 MG/ML IJ SOLN
40.0000 mg | Freq: Once | INTRAMUSCULAR | Status: AC
  Administered 2011-12-03: 40 mg via INTRAVENOUS
  Filled 2011-12-03: qty 4

## 2011-12-03 MED ORDER — METOPROLOL SUCCINATE ER 50 MG PO TB24
100.0000 mg | ORAL_TABLET | Freq: Two times a day (BID) | ORAL | Status: DC
  Administered 2011-12-04 – 2011-12-08 (×9): 100 mg via ORAL
  Filled 2011-12-03: qty 2
  Filled 2011-12-03: qty 1
  Filled 2011-12-03 (×3): qty 2
  Filled 2011-12-03: qty 1
  Filled 2011-12-03 (×2): qty 2
  Filled 2011-12-03: qty 1
  Filled 2011-12-03: qty 2

## 2011-12-03 NOTE — Addendum Note (Signed)
Addendum  created 12/03/11 1124 by Jule Economy, CRNA   Modules edited:Notes Section

## 2011-12-03 NOTE — Anesthesia Postprocedure Evaluation (Signed)
  Anesthesia Post-op Note  Patient: Charlotte [name]  Procedure(s) Performed: Procedure(s) (LRB): FASCIOTOMY CLOSURE (Right)  Patient Location: Room 306  Anesthesia Type: General  Level of Consciousness: awake, alert , oriented and patient cooperative  Airway and Oxygen Therapy: Patient Spontanous Breathing  Post-op Pain: mild  Post-op Assessment: Post-op Vital signs reviewed, Patient's Cardiovascular Status Stable, Respiratory Function Stable and Patent Airway  Post-op Vital Signs: Reviewed and stable  Complications: No apparent anesthesia complications

## 2011-12-03 NOTE — Progress Notes (Signed)
Subjective: 1 Day Post-Op Procedure(s) (LRB): FASCIOTOMY CLOSURE (Right) Patient reports pain as mild.    Objective: Vital signs in last 24 hours: Temp:  [97 F (36.1 C)-98.4 F (36.9 C)] 98.4 F (36.9 C) (05/01 0646) Pulse Rate:  [42-117] 117  (05/01 0646) Resp:  [10-37] 20  (05/01 0646) BP: (107-157)/(71-95) 157/92 mmHg (05/01 0646) SpO2:  [88 %-100 %] 92 % (05/01 0646)  Intake/Output from previous day: 04/30 0701 - 05/01 0700 In: 800 [I.V.:800] Out: 905 [Urine:800; Drains:5; Blood:100] Intake/Output this shift: Total I/O In: -  Out: 805 [Urine:800; Drains:5]   Basename 12/02/11 0530 12/01/11 0506 11/30/11 1725  HGB 10.9* 8.9* POST TRANSFUSION SPECIMEN    Basename 12/02/11 0530 12/01/11 0506  WBC 6.0 6.5  RBC 2.96* 2.31*  HCT 31.8* 26.3*  PLT 341 320    Basename 12/02/11 0530 12/01/11 0506  NA 134* 131*  K 3.9 3.6  CL 100 99  CO2 24 24  BUN 13 12  CREATININE 0.63 0.67  GLUCOSE 101* 125*  CALCIUM 9.1 8.6    Basename 12/02/11 0530 12/01/11 0506  LABPT -- --  INR 1.44 1.52*    Neurologically intact Neurovascular intact Sensation intact distally Intact pulses distally Dorsiflexion/Plantar flexion intact Compartment soft  Assessment/Plan: 1 Day Post-Op Procedure(s) (LRB): FASCIOTOMY CLOSURE (Right)  PT   Start anticoagulation tomorrow   Remove drains tomorrow   Arther Abbott 12/03/2011, 6:56 AM

## 2011-12-03 NOTE — Progress Notes (Signed)
Subjective: Pain is improved, no shortness of breath, no new complaints  Objective: Vital signs in last 24 hours: Temp:  [97 F (36.1 C)-98.4 F (36.9 C)] 98.4 F (36.9 C) (05/01 0646) Pulse Rate:  [42-117] 117  (05/01 0646) Resp:  [10-37] 20  (05/01 0800) BP: (107-157)/(71-95) 157/92 mmHg (05/01 0646) SpO2:  [88 %-100 %] 92 % (05/01 0658) Weight change:  Last BM Date: 11/30/11  Intake/Output from previous day: 04/30 0701 - 05/01 0700 In: 800 [I.V.:800] Out: 905 [Urine:800; Drains:5; Blood:100] Total I/O In: -  Out: 5 [Drains:5]   Physical Exam: General: Alert, awake, oriented x3, in no acute distress. HEENT: No bruits, no goiter. Heart: irregular Lungs: Clear to auscultation bilaterally. Abdomen: Soft, nontender, nondistended, positive bowel sounds. Extremities: No clubbing cyanosis or edema with positive pedal pulses. Neuro: Grossly intact, nonfocal.    Lab Results: Basic Metabolic Panel:  Basename 12/03/11 0530 12/02/11 0530  NA 135 134*  K 3.6 3.9  CL 102 100  CO2 24 24  GLUCOSE 96 101*  BUN 11 13  CREATININE 0.52 0.63  CALCIUM 8.5 9.1  MG -- --  PHOS -- --   Liver Function Tests:  Basename 12/01/11 0506  AST 26  ALT 15  ALKPHOS 60  BILITOT 1.4*  PROT 5.3*  ALBUMIN 2.4*   No results found for this basename: LIPASE:2,AMYLASE:2 in the last 72 hours No results found for this basename: AMMONIA:2 in the last 72 hours CBC:  Basename 12/03/11 0530 12/02/11 0530  WBC 5.3 6.0  NEUTROABS -- --  HGB 10.6* 10.9*  HCT 31.4* 31.8*  MCV 107.9* 107.4*  PLT 311 341   Cardiac Enzymes:  Basename 12/01/11 0506  CKTOTAL 758*  CKMB --  CKMBINDEX --  TROPONINI --   BNP: No results found for this basename: PROBNP:3 in the last 72 hours D-Dimer: No results found for this basename: DDIMER:2 in the last 72 hours CBG: No results found for this basename: GLUCAP:6 in the last 72 hours Hemoglobin A1C: No results found for this basename: HGBA1C in the last  72 hours Fasting Lipid Panel: No results found for this basename: CHOL,HDL,LDLCALC,TRIG,CHOLHDL,LDLDIRECT in the last 72 hours Thyroid Function Tests: No results found for this basename: TSH,T4TOTAL,FREET4,T3FREE,THYROIDAB in the last 72 hours Anemia Panel: No results found for this basename: VITAMINB12,FOLATE,FERRITIN,TIBC,IRON,RETICCTPCT in the last 72 hours Coagulation:  Basename 12/03/11 0530 12/02/11 0530  LABPROT 18.4* 17.8*  INR 1.50* 1.44   Urine Drug Screen: Drugs of Abuse  No results found for this basename: labopia, cocainscrnur, labbenz, amphetmu, thcu, labbarb    Alcohol Level: No results found for this basename: ETH:2 in the last 72 hours Urinalysis: No results found for this basename: COLORURINE:2,APPERANCEUR:2,LABSPEC:2,PHURINE:2,GLUCOSEU:2,HGBUR:2,BILIRUBINUR:2,KETONESUR:2,PROTEINUR:2,UROBILINOGEN:2,NITRITE:2,LEUKOCYTESUR:2 in the last 72 hours  No results found for this or any previous visit (from the past 240 hour(s)).  Studies/Results: Dg Chest Port 1 View  12/02/2011  *RADIOLOGY REPORT*  Clinical Data: Hypoxia.  PORTABLE CHEST - 1 VIEW  Comparison: Chest x-ray 11/15/2011.  Findings: There is an opacity at the left base partially obscuring the left hemidiaphragm.  Blunting of both costophrenic sulci is noted, compatible with small bilateral pleural effusions. There is cephalization of the pulmonary vasculature and slight indistinctness of the interstitial markings suggestive of mild pulmonary edema.  Heart is borderline enlarged.  Mediastinal contours are unremarkable.  IMPRESSION: 1.  Findings as above, concerning for mild congestive heart failure with small bilateral pleural effusions. 2.  In addition, there is a more focal opacity in the medial aspect of  the left lower lobe which may simply reflect atelectasis, however, underlying airspace consolidation from infection or aspiration is not excluded.  Original Report Authenticated By: Etheleen Mayhew, M.D.     Medications: Scheduled Meds:   . acetaminophen  1,000 mg Intravenous Q6H  . acetaminophen  1,000 mg Intravenous Once  . albuterol  2.5 mg Nebulization QID  . antiseptic oral rinse  15 mL Mouth Rinse BID  . diltiazem  180 mg Oral Daily  . docusate sodium  100 mg Oral BID  . ferrous sulfate  325 mg Oral TID PC  . gabapentin  100 mg Oral TID  . hydroxyurea  1,000 mg Oral Daily  . hydroxyurea  500 mg Oral QHS  . levothyroxine  25 mcg Oral Daily  . lidocaine      . methocarbamol  500 mg Oral QID   Or  . methocarbamol (ROBAXIN) IV  500 mg Intravenous QID  . metoprolol succinate  50 mg Oral BID  . midazolam      . ondansetron (ZOFRAN) IV  4 mg Intravenous Once  . ondansetron (ZOFRAN) IV  4 mg Intravenous Once  . propofol      . senna  1 tablet Oral BID  . traMADol  50 mg Oral Once  . DISCONTD: chlorhexidine  60 mL Topical Once  . DISCONTD: chlorhexidine  60 mL Topical Once   Continuous Infusions:   . sodium chloride 20 mL/hr at 12/03/11 0933  . DISCONTD: sodium chloride 10 mL/hr at 12/01/11 0706  . DISCONTD: lactated ringers 1,000 mL (12/02/11 1233)   PRN Meds:.acetaminophen, alum & mag hydroxide-simeth, bisacodyl, HYDROmorphone (DILAUDID) injection, magnesium hydroxide, menthol-cetylpyridinium, metoCLOPramide (REGLAN) injection, metoCLOPramide, oxyCODONE, phenol, senna-docusate, temazepam, DISCONTD: fentaNYL, DISCONTD: midazolam, DISCONTD: ondansetron (ZOFRAN) IV, DISCONTD: ondansetron (ZOFRAN) IV, DISCONTD: sodium chloride irrigation  Assessment/Plan:  1. Acute blood loss anemia. S/p 4 units prbc.  Hemoglobin stable at this point 2. Compartment syndrome, s/p fasciotomy and release 3. A fib with RVR.  Will increase toprol back to home dose also on diltiazem, restart anticoagulation tomorrow 4. 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 Hypertension, stable 6. Mild CHF, likely due to transfusions, will give one dose of lasix, try and wean off oxygen.   LOS: 5 days    Hance Caspers [facility] Pager: [phone] 12/03/2011, 10:12 AM

## 2011-12-03 NOTE — Progress Notes (Signed)
Physical Therapy Treatment Patient Details Name: Charlotte [name] MRN: [id number] DOB: 15-Jan-1941 Today's Date: 12/03/2011 Time: [id number] PT Time Calculation (min): 16 min  PT Assessment / Plan / Recommendation Comments on Treatment Session  Pt is highly motivated to return home at d/c, so states that she is now able to try to ambulate.  She fatigued easily, but gait was stable.  She should now progress well with walking.  She has steps to get into her home, so this will be a challenge to do before discharge.    Follow Up Recommendations  Home health PT;Skilled nursing facility (to be determined)    Equipment Recommendations  Defer to next venue    Frequency 7X/week   Plan      Precautions / Restrictions Precautions Precautions: Fall Restrictions Weight Bearing Restrictions: No   Pertinent Vitals/Pain     Mobility  Bed Mobility Bed Mobility: Supine to Sit;Sit to Supine;Sitting - Scoot to Edge of Bed Supine to Sit: 3: Mod assist;HOB elevated Sitting - Scoot to Edge of Bed: 4: Min guard Sit to Supine: 4: Min assist Details for Bed Mobility Assistance: less fearful of moving RLE Transfers Sit to Stand: 4: Min guard Stand to Sit: 4: Min guard Ambulation/Gait Ambulation/Gait Assistance: 4: Min guard Assistive device: Rolling walker Gait Pattern: Step-through pattern;Antalgic General Gait Details: able to put 50% weight on RLE without pain Stairs: No    Exercises     PT Goals Acute Rehab PT Goals PT Goal Formulation: With patient Time For Goal Achievement: 12/10/11 Potential to Achieve Goals: Good Pt will go Supine/Side to Sit: with supervision PT Goal: Supine/Side to Sit - Progress: Updated due to goal met Pt will go Sit to Supine/Side: with supervision PT Goal: Sit to Supine/Side - Progress: Updated due to goal met Pt will go Sit to Stand: with modified independence PT Goal: Sit to Stand - Progress: Updated due to goal met Pt will go Stand to Sit: with modified  independence PT Goal: Stand to Sit - Progress: Updated due to goals met Pt will Transfer Bed to Chair/Chair to Bed: with min assist PT Transfer Goal: Bed to Chair/Chair to Bed - Progress: Goal set today Pt will Ambulate: 16 - 50 feet;with rolling walker PT Goal: Ambulate - Progress: Updated due to goal met Pt will Go Up / Down Stairs: 3-5 stairs;with rail(s);with min assist PT Goal: Up/Down Stairs - Progress: Goal set today  Visit Information  Last PT Received On: 12/03/11    Subjective Data  Subjective: I don't want to go to rehab Patient Stated Goal: wants to return home   Cognition  Overall Cognitive Status: Appears within functional limits for tasks assessed/performed Arousal/Alertness: Awake/alert Orientation Level: Appears intact for tasks assessed Behavior During Session: [facility] for tasks performed    Balance  Balance Balance Assessed: No  End of Session PT - End of Session Equipment Utilized During Treatment: Gait belt Activity Tolerance: Patient tolerated treatment well Patient left: in bed;with call bell/phone within reach Nurse Communication: Mobility status    Sable Feil 12/03/2011, 1:59 PM

## 2011-12-03 NOTE — Evaluation (Signed)
Physical Therapy Evaluation Patient Details Name: Charlotte [name] MRN: [id number] DOB: [birth year] Today's Date: 12/03/2011 Time: [id number] PT Time Calculation (min): 30 min  PT Assessment / Plan / Recommendation Clinical Impression  Pt now post closure of R thigh incision.  Pain controlled at rest but c/o significant pain with movement.  She has become very deconditioned through this ordeal and feels that she is unable to ambulate.  From prior experience with her, I know that she is not malingering.  I am concerned that she might need SNF at d/c, but pt is resistant to this.  She states that she will be able to do more tomorrow.  We will keep monitoring and progress to gait as soon as possible.          PT Assessment  Patient needs continued PT services    Follow Up Recommendations  Home health PT;Skilled nursing facility (to be determined)    Equipment Recommendations  Defer to next venue    Frequency 7X/week    Precautions / Restrictions Precautions Precautions: Fall Restrictions Weight Bearing Restrictions: No RLE Weight Bearing: Weight bearing as tolerated (with PT per report)   Pertinent Vitals/Pain       Mobility  Bed Mobility Bed Mobility: Supine to Sit;Sit to Supine;Sitting - Scoot to Edge of Bed Supine to Sit: 3: Mod assist;HOB elevated Sitting - Scoot to Edge of Bed: 4: Min guard Transfers Sit to Stand: 3: Mod assist;With upper extremity assist;From elevated surface Stand to Sit: 4: Min assist;To chair/3-in-1;With upper extremity assist Ambulation/Gait Ambulation/Gait Assistance: Not tested (comment) (Pt feels too weak)    Exercises     PT Goals Acute Rehab PT Goals PT Goal Formulation: With patient Time For Goal Achievement: 12/10/11 Potential to Achieve Goals: Good Pt will go Supine/Side to Sit: with min assist;with HOB 0 degrees PT Goal: Supine/Side to Sit - Progress: Goal set today Pt will go Sit to Supine/Side: with min assist;with HOB not 0 degrees  (comment degree) PT Goal: Sit to Supine/Side - Progress: Goal set today Pt will go Sit to Stand: with supervision;with upper extremity assist PT Goal: Sit to Stand - Progress: Goal set today Pt will go Stand to Sit: with supervision;with upper extremity assist PT Goal: Stand to Sit - Progress: Goal set today Pt will Transfer Bed to Chair/Chair to Bed: with min assist PT Transfer Goal: Bed to Chair/Chair to Bed - Progress: Goal set today Pt will Ambulate: 1 - 15 feet;with supervision;with rolling walker PT Goal: Ambulate - Progress: Goal set today Pt will Go Up / Down Stairs: 3-5 stairs;with rail(s);with min assist PT Goal: Up/Down Stairs - Progress: Goal set today  Visit Information  Last PT Received On: 12/03/11    Subjective Data  Subjective: I don't think I can walk Patient Stated Goal: wants to return home   Prior Functioning       Cognition  Overall Cognitive Status: Appears within functional limits for tasks assessed/performed Arousal/Alertness: Awake/alert Orientation Level: Appears intact for tasks assessed Behavior During Session: [facility] for tasks performed    Extremity/Trunk Assessment Right Upper Extremity Assessment RUE ROM/Strength/Tone: Within functional levels RUE Sensation: WFL - Light Touch;WFL - Proprioception RUE Coordination: WFL - gross motor Left Upper Extremity Assessment LUE ROM/Strength/Tone: Within functional levels LUE Sensation: WFL - Light Touch;WFL - Proprioception LUE Coordination: WFL - gross motor Right Lower Extremity Assessment RLE ROM/Strength/Tone: Unable to fully assess;Due to pain (Has pain with movement-premedicated) RLE Sensation: WFL - Light Touch Left Lower Extremity Assessment LLE  ROM/Strength/Tone: [facility] for tasks assessed LLE Sensation: WFL - Light Touch;WFL - Proprioception LLE Coordination: WFL - gross motor Trunk Assessment Trunk Assessment: Normal   Balance Balance Balance Assessed: No  End of Session PT - End of  Session Equipment Utilized During Treatment: Gait belt Activity Tolerance: Patient limited by fatigue;Patient limited by pain Patient left: in chair;with call bell/phone within reach Nurse Communication: Mobility status   Sable Feil 12/03/2011, 11:28 AM

## 2011-12-04 ENCOUNTER — Encounter (HOSPITAL_COMMUNITY): Payer: Medicare Other

## 2011-12-04 DIAGNOSIS — R112 Nausea with vomiting, unspecified: Secondary | ICD-10-CM

## 2011-12-04 DIAGNOSIS — I10 Essential (primary) hypertension: Secondary | ICD-10-CM

## 2011-12-04 DIAGNOSIS — I4891 Unspecified atrial fibrillation: Secondary | ICD-10-CM

## 2011-12-04 DIAGNOSIS — Z7901 Long term (current) use of anticoagulants: Secondary | ICD-10-CM

## 2011-12-04 LAB — CBC
Hemoglobin: 10.9 g/dL — ABNORMAL LOW (ref 12.0–15.0)
MCH: 37.2 pg — ABNORMAL HIGH (ref 26.0–34.0)
Platelets: 312 10*3/uL (ref 150–400)
RBC: 2.93 MIL/uL — ABNORMAL LOW (ref 3.87–5.11)
WBC: 5.4 10*3/uL (ref 4.0–10.5)

## 2011-12-04 LAB — BASIC METABOLIC PANEL
CO2: 24 mEq/L (ref 19–32)
Calcium: 8.7 mg/dL (ref 8.4–10.5)
Chloride: 101 mEq/L (ref 96–112)
Glucose, Bld: 104 mg/dL — ABNORMAL HIGH (ref 70–99)
Sodium: 135 mEq/L (ref 135–145)

## 2011-12-04 LAB — PROTIME-INR
INR: 1.47 (ref 0.00–1.49)
Prothrombin Time: 18.1 seconds — ABNORMAL HIGH (ref 11.6–15.2)

## 2011-12-04 MED ORDER — WARFARIN SODIUM 5 MG PO TABS
5.0000 mg | ORAL_TABLET | Freq: Every day | ORAL | Status: AC
  Administered 2011-12-04: 5 mg via ORAL
  Filled 2011-12-04: qty 1

## 2011-12-04 MED ORDER — METOPROLOL TARTRATE 1 MG/ML IV SOLN
5.0000 mg | Freq: Once | INTRAVENOUS | Status: AC
  Administered 2011-12-04: 5 mg via INTRAVENOUS
  Filled 2011-12-04: qty 5

## 2011-12-04 MED ORDER — POTASSIUM CHLORIDE CRYS ER 20 MEQ PO TBCR
40.0000 meq | EXTENDED_RELEASE_TABLET | Freq: Two times a day (BID) | ORAL | Status: DC
  Administered 2011-12-04 – 2011-12-05 (×3): 40 meq via ORAL
  Filled 2011-12-04: qty 1
  Filled 2011-12-04 (×2): qty 2

## 2011-12-04 MED ORDER — DILTIAZEM HCL ER COATED BEADS 180 MG PO CP24
300.0000 mg | ORAL_CAPSULE | Freq: Every day | ORAL | Status: DC
  Administered 2011-12-04: 300 mg via ORAL
  Filled 2011-12-04 (×2): qty 1

## 2011-12-04 MED ORDER — WARFARIN - PHYSICIAN DOSING INPATIENT
Freq: Every day | Status: DC
  Administered 2011-12-05: 18:00:00

## 2011-12-04 MED ORDER — SODIUM CHLORIDE 0.9 % IN NEBU
INHALATION_SOLUTION | RESPIRATORY_TRACT | Status: AC
  Administered 2011-12-04: 3 mL
  Filled 2011-12-04: qty 3

## 2011-12-04 NOTE — Progress Notes (Signed)
Physical Therapy Treatment Patient Details Name: Charlotte [name] MRN: [id number] DOB: 23-Jan-1941 Today's Date: 12/04/2011 Time: [id number] PT Time Calculation (min): 30 min  PT Assessment / Plan / Recommendation Comments on Treatment Session  Patient was able to complete a total of 24' of gait training with RW;min guard. Patient fatigued easily and c/o 5/10 RLE pain with activity (pain meds admin prior to therapy). Plan is to attempt stair training this afternoon if appropriate due to pt fatiguing so quickly Gait was completed 45' x2 with seat rest break needed. Patient was also able to tolerate 5 mins of standing with RW during pericare.    Follow Up Recommendations       Equipment Recommendations       Frequency     Plan      Precautions / Restrictions Restrictions Weight Bearing Restrictions: Yes RLE Weight Bearing: Weight bearing as tolerated   Pertinent Vitals/Pain 5/10 RLE with pain meds    Mobility  Transfers Sit to Stand: 4: Min guard Stand to Sit: 4: Min guard Ambulation/Gait Ambulation/Gait Assistance: 4: Min guard Ambulation Distance (Feet): 24 Feet (12' + 12' with seated rest break between) Assistive device: Rolling walker Gait Pattern: Trunk flexed;Step-through pattern;Antalgic Gait velocity: slow and labored Stairs:  (will attempt at 2nd visit if appropriate)    Exercises Other Exercises Other Exercises: sit<>stand x3   PT Goals Acute Rehab PT Goals PT Goal: Stand to Sit - Progress: Progressing toward goal PT Transfer Goal: Bed to Chair/Chair to Bed - Progress: Met PT Goal: Ambulate - Progress: Progressing toward goal PT Goal: Up/Down Stairs - Progress: Not met  Visit Information  Last PT Received On: 12/04/11    Subjective Data      Cognition       Balance     End of Session PT - End of Session Equipment Utilized During Treatment: Gait belt Activity Tolerance: Patient limited by fatigue Patient left: in chair;with call bell/phone within  reach    [facility], [place] 12/04/2011, 10:31 AM

## 2011-12-04 NOTE — Progress Notes (Signed)
Patient ID: Charlotte [name], female   DOB: 1940/09/17, 71 y.o.   MRN: [id number] Chief Complaint  Patient presents with  . Hip Pain   BP 148/84  Pulse 125  Temp(Src) 98.3 F (36.8 C) (Oral)  Resp 20  Ht 6' (1.829 m)  Wt 98.1 kg (216 lb 4.3 oz)  BMI 29.33 kg/m2  SpO2 94% BMET    Component Value Date/Time   NA 135 12/04/2011 0528   K 3.0* 12/04/2011 0528   CL 101 12/04/2011 0528   CO2 24 12/04/2011 0528   GLUCOSE 104* 12/04/2011 0528   BUN 9 12/04/2011 0528   CREATININE 0.52 12/04/2011 0528   CALCIUM 8.7 12/04/2011 0528   GFRNONAA >90 12/04/2011 0528   GFRAA >90 12/04/2011 0528    CBC    Component Value Date/Time   WBC 5.4 12/04/2011 0528   RBC 2.93* 12/04/2011 0528   HGB 10.9* 12/04/2011 0528   HCT 31.5* 12/04/2011 0528   PLT 312 12/04/2011 0528   MCV 107.5* 12/04/2011 0528   MCH 37.2* 12/04/2011 0528   MCHC 34.6 12/04/2011 0528   RDW 24.2* 12/04/2011 0528   LYMPHSABS 1.0 11/29/2011 0705   MONOABS 0.5 11/29/2011 0705   EOSABS 0.0 11/29/2011 0705   BASOSABS 0.0 11/29/2011 0705    The patient is status post wound closure from a fasciotomy. She's in stable condition she did have a rapid heart rate today.  She can start Coumadin again. I will dose the Coumadin we'll start with 5 mg daily. No Lovenox. No aspirin at this time.  The staples were removed from her original surgical wound that wound is clean. The dressing was changed from the wound closure and that wound is clean.  The patient is eager to return to home and I anticipate a return to home with home health probably tomorrow.

## 2011-12-04 NOTE — Progress Notes (Signed)
Subjective: Patient denies any complaints, sitting up in bed, no shortness of breath, no chest pain  Objective: Vital signs in last 24 hours: Temp:  [97.8 F (36.6 C)-99 F (37.2 C)] 98.2 F (36.8 C) (05/02 1407) Pulse Rate:  [94-125] 94  (05/02 1407) Resp:  [18-20] 18  (05/02 1407) BP: (125-148)/(82-95) 125/82 mmHg (05/02 1407) SpO2:  [90 %-94 %] 91 % (05/02 1407) FiO2 (%):  [21 %] 21 % (05/02 1123) Weight change:  Last BM Date: 12/02/11  Intake/Output from previous day: 05/01 0701 - 05/02 0700 In: 880 [P.O.:720; I.V.:56; IV Piggyback:104] Out: 3305 [Urine:3300; Drains:5] Total I/O In: [id number] [P.O.:240; I.V.:411] Out: 600 [Urine:600]   Physical Exam: General: Alert, awake, oriented x3, in no acute distress. HEENT: No bruits, no goiter. Heart: irregular, tachycardic Lungs: Clear to auscultation bilaterally. Abdomen: Soft, nontender, nondistended, positive bowel sounds. Extremities: No clubbing cyanosis or edema with positive pedal pulses. Neuro: Grossly intact, nonfocal.    Lab Results: Basic Metabolic Panel:  Basename 12/04/11 0528 12/03/11 0530  NA 135 135  K 3.0* 3.6  CL 101 102  CO2 24 24  GLUCOSE 104* 96  BUN 9 11  CREATININE 0.52 0.52  CALCIUM 8.7 8.5  MG -- --  PHOS -- --   Liver Function Tests: No results found for this basename: AST:2,ALT:2,ALKPHOS:2,BILITOT:2,PROT:2,ALBUMIN:2 in the last 72 hours No results found for this basename: LIPASE:2,AMYLASE:2 in the last 72 hours No results found for this basename: AMMONIA:2 in the last 72 hours CBC:  Basename 12/04/11 0528 12/03/11 0530  WBC 5.4 5.3  NEUTROABS -- --  HGB 10.9* 10.6*  HCT 31.5* 31.4*  MCV 107.5* 107.9*  PLT 312 311   Cardiac Enzymes: No results found for this basename: CKTOTAL:3,CKMB:3,CKMBINDEX:3,TROPONINI:3 in the last 72 hours BNP: No results found for this basename: PROBNP:3 in the last 72 hours D-Dimer: No results found for this basename: DDIMER:2 in the last 72  hours CBG: No results found for this basename: GLUCAP:6 in the last 72 hours Hemoglobin A1C: No results found for this basename: HGBA1C in the last 72 hours Fasting Lipid Panel: No results found for this basename: CHOL,HDL,LDLCALC,TRIG,CHOLHDL,LDLDIRECT in the last 72 hours Thyroid Function Tests: No results found for this basename: TSH,T4TOTAL,FREET4,T3FREE,THYROIDAB in the last 72 hours Anemia Panel: No results found for this basename: VITAMINB12,FOLATE,FERRITIN,TIBC,IRON,RETICCTPCT in the last 72 hours Coagulation:  Basename 12/04/11 0528 12/03/11 0530  LABPROT 18.1* 18.4*  INR 1.47 1.50*   Urine Drug Screen: Drugs of Abuse  No results found for this basename: labopia, cocainscrnur, labbenz, amphetmu, thcu, labbarb    Alcohol Level: No results found for this basename: ETH:2 in the last 72 hours Urinalysis: No results found for this basename: COLORURINE:2,APPERANCEUR:2,LABSPEC:2,PHURINE:2,GLUCOSEU:2,HGBUR:2,BILIRUBINUR:2,KETONESUR:2,PROTEINUR:2,UROBILINOGEN:2,NITRITE:2,LEUKOCYTESUR:2 in the last 72 hours  No results found for this or any previous visit (from the past 240 hour(s)).  Studies/Results: No results found.  Medications: Scheduled Meds:   . albuterol  2.5 mg Nebulization QID  . antiseptic oral rinse  15 mL Mouth Rinse BID  . diltiazem  300 mg Oral Daily  . docusate sodium  100 mg Oral BID  . ferrous sulfate  325 mg Oral TID PC  . gabapentin  100 mg Oral TID  . hydroxyurea  1,000 mg Oral Daily  . hydroxyurea  500 mg Oral QHS  . levothyroxine  25 mcg Oral Daily  . methocarbamol  500 mg Oral QID   Or  . methocarbamol (ROBAXIN) IV  500 mg Intravenous QID  . metoprolol  5 mg Intravenous Once  .  metoprolol succinate  100 mg Oral BID  . potassium chloride  40 mEq Oral BID  . senna  1 tablet Oral BID  . sodium chloride      . warfarin  5 mg Oral q1800  . Warfarin - Physician Dosing Inpatient   Does not apply q1800  . DISCONTD: diltiazem  180 mg Oral Daily    Continuous Infusions:   . sodium chloride 20 mL/hr at 12/03/11 0933   PRN Meds:.acetaminophen, alum & mag hydroxide-simeth, bisacodyl, HYDROmorphone (DILAUDID) injection, magnesium hydroxide, menthol-cetylpyridinium, metoCLOPramide (REGLAN) injection, metoCLOPramide, oxyCODONE, phenol, senna-docusate, temazepam  Assessment/Plan:  1. Acute blood loss anemia. S/p 4 units prbc. Hemoglobin stable at this point  2. Compartment syndrome, s/p fasciotomy and release  3. A fib with RVR.Increased toprol and diltiazem.  HR is better this afternoon, started back on anticoagulation 4. 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 Hypertension, stable  6. Mild CHF, likely due to transfusions, improved with lasix.  If HR remains stable, then can possibly discharge home tomorrow   LOS: 6 days   Janalyn Higby [facility] Pager: [phone] 12/04/2011, 2:52 PM

## 2011-12-04 NOTE — Progress Notes (Signed)
Physical Therapy Treatment Patient Details Name: Charlotte [name] MRN: [id number] DOB: 1940-10-12 Today's Date: 12/04/2011 Time: [id number] PT Time Calculation (min): 20 min Charges: Gait x 10'   PT Assessment / Plan / Recommendation Comments on Treatment Session  Pt able to ambulate 66' this session before becoming too fatigued. Stair training not completed secondary to fatigue with gait. Pt reports pain increase to 4/10 with gait. Pt ambulates safely with walker with minimal gait deviations. Pt requires vc's for safety with sit-stand/ stand-sit.                       Mobility  Transfers Sit to Stand: 4: Min guard (with vc's for safety) Stand to Sit: 4: Min guard (with vc's for safety) Ambulation/Gait Ambulation/Gait Assistance: 4: Min guard Ambulation Distance (Feet): 36 Feet (without rest break) Assistive device: Rolling walker Gait Pattern: Step-through pattern;Decreased stride length;Trunk flexed Gait velocity: normal with mild gait deviations Stairs:  (will attempt at 2nd visit if appropriate)    Exercises Other Exercises Other Exercises: sit<>stand x3   PT Goals Acute Rehab PT Goals PT Goal: Stand to Sit - Progress: Progressing toward goal PT Transfer Goal: Bed to Chair/Chair to Bed - Progress: Met PT Goal: Ambulate - Progress: Progressing toward goal PT Goal: Up/Down Stairs - Progress: Not met  Visit Information  Last PT Received On: 12/04/11    Subjective Data  Subjective: I only have pain when I move my leg.         End of Session PT - End of Session Equipment Utilized During Treatment: Gait belt Activity Tolerance: Patient limited by fatigue Patient left: in bed;with call bell/phone within reach    Rachelle Hora, PTA 12/04/2011, 1:48 PM

## 2011-12-05 ENCOUNTER — Encounter (HOSPITAL_COMMUNITY): Payer: Self-pay | Admitting: Orthopedic Surgery

## 2011-12-05 ENCOUNTER — Inpatient Hospital Stay (HOSPITAL_COMMUNITY): Payer: Medicare Other

## 2011-12-05 DIAGNOSIS — R112 Nausea with vomiting, unspecified: Secondary | ICD-10-CM

## 2011-12-05 DIAGNOSIS — Z7901 Long term (current) use of anticoagulants: Secondary | ICD-10-CM

## 2011-12-05 DIAGNOSIS — I4891 Unspecified atrial fibrillation: Secondary | ICD-10-CM

## 2011-12-05 DIAGNOSIS — I10 Essential (primary) hypertension: Secondary | ICD-10-CM

## 2011-12-05 LAB — PROTIME-INR: Prothrombin Time: 18.6 seconds — ABNORMAL HIGH (ref 11.6–15.2)

## 2011-12-05 LAB — BASIC METABOLIC PANEL
BUN: 11 mg/dL (ref 6–23)
Calcium: 9 mg/dL (ref 8.4–10.5)
Chloride: 101 mEq/L (ref 96–112)
Creatinine, Ser: 0.54 mg/dL (ref 0.50–1.10)
GFR calc Af Amer: >90 mL/min (ref >90)
GFR calc non Af Amer: >90 mL/min (ref >90)

## 2011-12-05 MED ORDER — HYDROXYUREA 500 MG PO CAPS: 500.0000 mg | ORAL_CAPSULE | Freq: Every day | ORAL | Status: DC

## 2011-12-05 MED ORDER — FERROUS SULFATE 325 (65 FE) MG PO TABS: 325.0000 mg | ORAL_TABLET | Freq: Three times a day (TID) | ORAL | Status: DC

## 2011-12-05 MED ORDER — ALBUTEROL SULFATE (5 MG/ML) 0.5% IN NEBU
2.5000 mg | INHALATION_SOLUTION | Freq: Two times a day (BID) | RESPIRATORY_TRACT | Status: DC
  Administered 2011-12-05 – 2011-12-08 (×6): 2.5 mg via RESPIRATORY_TRACT
  Filled 2011-12-05 (×6): qty 0.5

## 2011-12-05 MED ORDER — SODIUM CHLORIDE 0.9 % IJ SOLN
INTRAMUSCULAR | Status: AC
  Filled 2011-12-05: qty 3

## 2011-12-05 MED ORDER — WARFARIN SODIUM 5 MG PO TABS
5.0000 mg | ORAL_TABLET | Freq: Every day | ORAL | Status: DC
  Administered 2011-12-05 – 2011-12-07 (×3): 5 mg via ORAL
  Filled 2011-12-05 (×3): qty 1

## 2011-12-05 MED ORDER — POTASSIUM CHLORIDE CRYS ER 20 MEQ PO TBCR
40.0000 meq | EXTENDED_RELEASE_TABLET | Freq: Two times a day (BID) | ORAL | Status: DC
  Administered 2011-12-05 – 2011-12-08 (×6): 40 meq via ORAL
  Filled 2011-12-05 (×6): qty 2

## 2011-12-05 MED ORDER — DIGOXIN 250 MCG PO TABS
0.2500 mg | ORAL_TABLET | Freq: Every day | ORAL | Status: DC
  Administered 2011-12-05 – 2011-12-08 (×4): 0.25 mg via ORAL
  Filled 2011-12-05 (×3): qty 1

## 2011-12-05 MED ORDER — FUROSEMIDE 10 MG/ML IJ SOLN
40.0000 mg | Freq: Once | INTRAMUSCULAR | Status: AC
  Administered 2011-12-05: 40 mg via INTRAVENOUS
  Filled 2011-12-05: qty 4

## 2011-12-05 MED ORDER — METOCLOPRAMIDE HCL 10 MG PO TABS: 10.0000 mg | ORAL_TABLET | Freq: Four times a day (QID) | ORAL | Status: DC | PRN

## 2011-12-05 MED ORDER — DIGOXIN 0.25 MG/ML IJ SOLN
0.5000 mg | Freq: Once | INTRAMUSCULAR | Status: AC
  Administered 2011-12-05: 0.5 mg via INTRAVENOUS

## 2011-12-05 MED ORDER — OXYCODONE HCL 5 MG PO TABS: 5.0000 mg | ORAL_TABLET | ORAL | Status: DC | PRN

## 2011-12-05 MED ORDER — FUROSEMIDE 10 MG/ML IJ SOLN
40.0000 mg | Freq: Two times a day (BID) | INTRAMUSCULAR | Status: DC
  Administered 2011-12-05 – 2011-12-08 (×6): 40 mg via INTRAVENOUS
  Filled 2011-12-05 (×6): qty 4

## 2011-12-05 MED ORDER — DILTIAZEM HCL ER COATED BEADS 180 MG PO CP24
360.0000 mg | ORAL_CAPSULE | Freq: Every day | ORAL | Status: DC
  Administered 2011-12-05 – 2011-12-08 (×4): 360 mg via ORAL
  Filled 2011-12-05 (×2): qty 2
  Filled 2011-12-05: qty 1
  Filled 2011-12-05: qty 2

## 2011-12-05 NOTE — Discharge Summary (Addendum)
Physician Discharge Summary  Patient ID: Charlotte [name] MRN: [id number] DOB/AGE: [birth year] 71 y.o.  Admit date: 11/28/2011 Discharge date: 12/05/2011  Admission Diagnoses: right thigh pain   Discharge Diagnoses: compartment syndrome right thigh  Active Problems:  Pain of right thigh  Compartment syndrome, nontraumatic, lower extremity compartment syndrome traumatic   Discharged Condition: good  Hospital Course: The patient was admitted on Friday, 11/28/2011 with right thigh pain and impending compartment syndrome. She was treated with IV pain medication seemed to improve initially was able to sleep through the night but in the morning her examination revealed that her thigh was more tense. Compartment pressures measured 44 and 42 in the rectus femoris muscle and 33 on the vastus lateralis muscle and it was felt that her clinical course with worsening she was therefore taken to surgery on Saturday, April 27. She underwent a right thigh fasciotomy. At the time of surgery we found a torn vastus intermedius muscle with a large hematoma consisting of clotted blood. The vastus intermedius musculature had questionable viability, a portion was resected. The portion that was contractile was left intact and the wound was packed. The patient required several blood transfusions but was taken back to surgery on Tuesday, April 30 for second look and wound closure. The remaining vastus musculature was intact contractile with good color. The wound is therefore closed and drained. The drain was subsequently removed the patient tolerated physical therapy well she had good pain control and was scheduled for discharged home on May 3 but kept for HR > 100. This was treated with dig. She also had some fluid on her lungs which was treated with lasix. The lasix dropped her potassium which led to hypokalemia and need for supplementation.    Consults: Internal medicine for management of medical problems  Significant  Diagnostic Studies: labs:  CBC    Component Value Date/Time   WBC 5.4 12/04/2011 0528   RBC 2.93* 12/04/2011 0528   HGB 11.5* 12/08/2011 0516   HCT 34.0* 12/08/2011 0516   PLT 312 12/04/2011 0528   MCV 107.5* 12/04/2011 0528   MCH 37.2* 12/04/2011 0528   MCHC 34.6 12/04/2011 0528   RDW 24.2* 12/04/2011 0528   LYMPHSABS 1.0 11/29/2011 0705   MONOABS 0.5 11/29/2011 0705   EOSABS 0.0 11/29/2011 0705   BASOSABS 0.0 11/29/2011 0705    BMET    Component Value Date/Time   NA 134* 12/08/2011 0516   K 3.8 12/08/2011 0516   CL 99 12/08/2011 0516   CO2 23 12/08/2011 0516   GLUCOSE 108* 12/08/2011 0516   BUN 12 12/08/2011 0516   CREATININE 0.55 12/08/2011 0516   CALCIUM 9.0 12/08/2011 0516   GFRNONAA >90 12/08/2011 0516   GFRAA >90 12/08/2011 0516      INR/Prothrombin Time: 1.8   Treatments: surgery: Right thigh fasciotomy. 4 units of blood.  Discharge Exam: Blood pressure 137/96, pulse 108, temperature 98.9 F (37.2 C), temperature source Oral, resp. rate 16, height 6' (1.829 m), weight 98.1 kg (216 lb 4.3 oz), SpO2 95.00%. Incision/Wound: clean. Thigh compartment soft. Pain controlled.  Disposition: 06-Home-Health Care Svc  Discharge Orders    Future Appointments: Provider: Department: Dept Phone: Center:   12/08/2011 9:10 AM [place] [phone] None   01/08/2012 9:20 AM [place] [phone] None   02/06/2012 9:20 AM [place] [phone] None   03/25/2012 10:00 AM [place] [phone] None   03/29/2012 10:30 AM Pieter Partridge, MD [place]  [phone] None     Future Orders Please Complete By Expires   Diet - low sodium heart healthy      Call MD / Call 911      Comments:   If you experience chest pain or shortness of breath, CALL 911 and be transported to the hospital emergency room.  If you develope a fever above 101 F, pus (white drainage) or increased drainage or redness at the wound, or calf pain, call your  surgeon's office.   Constipation Prevention      Comments:   Drink plenty of fluids.  Prune juice may be helpful.  You may use a stool softener, such as Colace (over the counter) 100 mg twice a day.  Use MiraLax (over the counter) for constipation as needed.   Increase activity slowly as tolerated      Weight Bearing as taught in Physical Therapy      Comments:   Use a walker or crutches as instructed.   Home Health      Scheduling Instructions:   Pt/inr M and Th   PT gait train and rom exercises left knee and hip WBAT   Questions: Responses:   To provide the following care/treatments PT    [place]   Face-to-face encounter      Comments:   I Arther Abbott certify that this patient is under my care and that I, or a nurse practitioner or physician's assistant working with me, had a face-to-face encounter that meets the physician face-to-face encounter requirements with this patient on 12/05/2011.       Questions: Responses:   The encounter with the patient was in whole, or in part, for the following medical condition, which is the primary reason for home health care hip fracture , fasciotomy   I certify that, based on my findings, the following services are medically necessary home health services Physical therapy    Nursing   My clinical findings support the need for the above services Patients with multiple co-morbidities (wounds and uncontrolled DM for example)   Further, I certify that my clinical findings support that this patient is homebound (i.e. absences from home require considerable and taxing effort and are for medical reasons or religious services or infrequently or of short duration when for other reasons) Ambulates short distances less than 300 feet   To provide the following care/treatments PT    [place] A[facility]     Medication List  As of 12/05/2011  7:44 AM   STOP taking these medications         enoxaparin 100 MG/ML injection         TAKE  these medications         aspirin EC 81 MG tablet   Take 81 mg by mouth daily.      diltiazem 180 MG 24 hr capsule   Commonly known as: TIAZAC   Take 180 mg by mouth daily.      ferrous sulfate 325 (65 FE) MG tablet   Take 1 tablet (325 mg total) by mouth 3 (three) times daily after meals.      gabapentin 100 MG capsule   Commonly known as: NEURONTIN   Take 100 mg by mouth 3 (three) times daily.      hydroxyurea 500 MG capsule   Commonly known as: HYDREA   Take 1 capsule (500 mg total) by mouth at bedtime. May take with food to  minimize GI side effects.      levothyroxine 25 MCG tablet   Commonly known as: SYNTHROID, LEVOTHROID   Take 25 mcg by mouth daily.      metoCLOPramide 10 MG tablet   Commonly known as: REGLAN   Take 1 tablet (10 mg total) by mouth every 6 (six) hours as needed (nausea).      metoprolol succinate 100 MG 24 hr tablet   Commonly known as: TOPROL-XL   Take 100 mg by mouth 2 (two) times daily.      oxyCODONE 5 MG immediate release tablet   Commonly known as: Oxy IR/ROXICODONE   Take 1 tablet (5 mg total) by mouth every 4 (four) hours as needed.      warfarin 5 MG tablet   Commonly known as: COUMADIN   Take 5 mg by mouth daily.           Follow-up Information    Follow up with Arther Abbott, MD on 12/15/2011.   Contact information:   [address], [place] [phone]       Follow up with Sanjuana Kava, MD on 01/05/2012.   Contact information:   [address] [place] [phone]       Follow up with Pieter Partridge, MD on 12/17/2011. (adjust medications hydroxyurea)    Contact information:   [address] [phone]         patient will go be on warfarin 7.5 m, w , f and s,t,th,sa onwarfarin 5 mg a day with PT/INRs on tues and friday. She should see the hematologist to adjust her Hydrea. I will see her  regarding her wound related to the fasciotomy. She will followup with her orthopedist related to her hip fracture.  Signed: Arther Abbott 12/05/2011, 7:44 AM

## 2011-12-05 NOTE — Progress Notes (Signed)
*  PRELIMINARY RESULTS* Echocardiogram 2D Echocardiogram has been performed.  Charlotte [name] 12/05/2011, 11:47 AM

## 2011-12-05 NOTE — Care Management Note (Addendum)
    Page 1 of 1   12/08/2011     12:25:28 PM   CARE MANAGEMENT NOTE 12/08/2011  Patient:  Charlotte [name], Charlotte [name]   Account Number:  [id number]  Date Initiated:  12/01/2011  Documentation initiated by:  Vladimir Creeks  Subjective/Objective Assessment:   Pt admitted with compartment syndrome, and had surgical intervention, with wound left open. She had a fx hip and surgery 2-3 weeks ago, and was doing well at home with [facility] until 4/26.     Action/Plan:   Plan is to return home with spouse and son, with [facility], if does well after the surgery to close the wound on 12/02/11   Anticipated DC Date:  12/04/2011   Anticipated DC Plan:  [place]  CM consult      [place]   Choice offered to / List presented to:  [name]   DME arranged  [facility]      DME agency  [place] arranged  HH-1 RN  [place]   Status of service:  Completed, signed off Medicare Important Message given?   (If response is "NO", the following Medicare IM given date fields will be blank) Date Medicare IM given:   Date Additional Medicare IM given:    Discharge Disposition:  [place]  Per UR Regulation:  Reviewed for med. necessity/level of care/duration of stay  If discussed at [place] of Stay Meetings, dates discussed:   12/04/2011    Comments:  12/08/11 [place] notified to bring O2 for DC home.  12/05/11 [place], RN BSN CM [facility] RN will draw PT/INR on Monday's and Thursday's and call results to Dr. Aline Brochure. Romualdo Bolk of [facility] is aware.  12/05/11 [place], RN BSN CM Pt to be discharged on 12/06/11. Pt discharge for today cancelled due to pt having increased heart rate. Hospitalist services requested discharged be held today. Romualdo Bolk of [facility] is aware of hold on discharge and will collect information from chart and arrange  services to be started on 12/07/11 if pt is discharged on 12/06/11. Pt will require RN and aide. Pt and pts nurse is aware of pts discharge arrangements.  12/01/11 [place]

## 2011-12-05 NOTE — Progress Notes (Signed)
Patient ID: Charlotte [name], female   DOB: [birth year], 71 y.o.   MRN: [id number] Discharge cancelled   HR elevated

## 2011-12-05 NOTE — Progress Notes (Signed)
Subjective: Patient having significant pain in her leg, denies any shortness of breath or chest pain  Objective: Vital signs in last 24 hours: Temp:  [97.8 F (36.6 C)-98.9 F (37.2 C)] 98.9 F (37.2 C) (05/02 2030) Pulse Rate:  [94-125] 108  (05/02 2030) Resp:  [16-18] 16  (05/02 2030) BP: (125-143)/(82-96) 137/96 mmHg (05/02 2030) SpO2:  [89 %-95 %] 95 % (05/03 0656) FiO2 (%):  [21 %] 21 % (05/02 1123) Weight change:  Last BM Date: 12/04/11  Intake/Output from previous day: 05/02 0701 - 05/03 0700 In: 1073 [P.O.:240; I.V.:833] Out: 1200 [Urine:1200] Total I/O In: 120 [P.O.:120] Out: -    Physical Exam: General: Alert, awake, oriented x3, in no acute distress. HEENT: No bruits, no goiter. Heart: irregular Lungs: Clear to auscultation bilaterally. Abdomen: Soft, nontender, nondistended, positive bowel sounds. Extremities: 1+ edema b/l Neuro: Grossly intact, nonfocal.    Lab Results: Basic Metabolic Panel:  Basename 12/05/11 0506 12/04/11 0528  NA 137 135  K 4.2 3.0*  CL 101 101  CO2 23 24  GLUCOSE 101* 104*  BUN 11 9  CREATININE 0.54 0.52  CALCIUM 9.0 8.7  MG -- --  PHOS -- --   Liver Function Tests: No results found for this basename: AST:2,ALT:2,ALKPHOS:2,BILITOT:2,PROT:2,ALBUMIN:2 in the last 72 hours No results found for this basename: LIPASE:2,AMYLASE:2 in the last 72 hours No results found for this basename: AMMONIA:2 in the last 72 hours CBC:  Basename 12/04/11 0528 12/03/11 0530  WBC 5.4 5.3  NEUTROABS -- --  HGB 10.9* 10.6*  HCT 31.5* 31.4*  MCV 107.5* 107.9*  PLT 312 311   Cardiac Enzymes: No results found for this basename: CKTOTAL:3,CKMB:3,CKMBINDEX:3,TROPONINI:3 in the last 72 hours BNP: No results found for this basename: PROBNP:3 in the last 72 hours D-Dimer: No results found for this basename: DDIMER:2 in the last 72 hours CBG: No results found for this basename: GLUCAP:6 in the last 72 hours Hemoglobin A1C: No results found  for this basename: HGBA1C in the last 72 hours Fasting Lipid Panel: No results found for this basename: CHOL,HDL,LDLCALC,TRIG,CHOLHDL,LDLDIRECT in the last 72 hours Thyroid Function Tests: No results found for this basename: TSH,T4TOTAL,FREET4,T3FREE,THYROIDAB in the last 72 hours Anemia Panel: No results found for this basename: VITAMINB12,FOLATE,FERRITIN,TIBC,IRON,RETICCTPCT in the last 72 hours Coagulation:  Basename 12/05/11 0503 12/04/11 0528  LABPROT 18.6* 18.1*  INR 1.52* 1.47   Urine Drug Screen: Drugs of Abuse  No results found for this basename: labopia, cocainscrnur, labbenz, amphetmu, thcu, labbarb    Alcohol Level: No results found for this basename: ETH:2 in the last 72 hours Urinalysis: No results found for this basename: COLORURINE:2,APPERANCEUR:2,LABSPEC:2,PHURINE:2,GLUCOSEU:2,HGBUR:2,BILIRUBINUR:2,KETONESUR:2,PROTEINUR:2,UROBILINOGEN:2,NITRITE:2,LEUKOCYTESUR:2 in the last 72 hours  No results found for this or any previous visit (from the past 240 hour(s)).  Studies/Results: No results found.  Medications: Scheduled Meds:   . albuterol  2.5 mg Nebulization QID  . antiseptic oral rinse  15 mL Mouth Rinse BID  . digoxin  0.5 mg Intravenous Once  . digoxin  0.25 mg Oral Daily  . diltiazem  360 mg Oral Daily  . docusate sodium  100 mg Oral BID  . ferrous sulfate  325 mg Oral TID PC  . gabapentin  100 mg Oral TID  . hydroxyurea  1,000 mg Oral Daily  . hydroxyurea  500 mg Oral QHS  . levothyroxine  25 mcg Oral Daily  . methocarbamol  500 mg Oral QID   Or  . methocarbamol (ROBAXIN) IV  500 mg Intravenous QID  . metoprolol  5 mg Intravenous Once  . metoprolol succinate  100 mg Oral BID  . potassium chloride  40 mEq Oral BID  . senna  1 tablet Oral BID  . sodium chloride      . warfarin  5 mg Oral q1800  . Warfarin - Physician Dosing Inpatient   Does not apply q1800  . DISCONTD: diltiazem  300 mg Oral Daily   Continuous Infusions:   . sodium chloride 20  mL/hr at 12/03/11 0933   PRN Meds:.acetaminophen, alum & mag hydroxide-simeth, bisacodyl, HYDROmorphone (DILAUDID) injection, magnesium hydroxide, menthol-cetylpyridinium, metoCLOPramide (REGLAN) injection, metoCLOPramide, oxyCODONE, phenol, senna-docusate, temazepam  Assessment/Plan:  1. Acute blood loss anemia. S/p 4 units prbc. Hemoglobin stable at this point   2. Compartment syndrome, s/p fasciotomy and release   3. A fib with RVR. Patient's heart rate remains elevated again today in the 120 range.  Will add digoxin for better rate control.  Will check 2d echo to assess EF. She is on anticoagulation. Her cardiologist is Mali Hilty at [facility]  4. 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 Hypertension, stable   6. Mild CHF, likely due to transfusions, will give another dose of lasix, as she does have some pitting edema.  Repeat chest xray and check bnp.  Check oxygen saturation on room air.  Patient will likely require another 61 - 48hrs in the hospital.  Discussed with Dr. Aline Brochure.   LOS: 7 days   Alexandrya Chim [facility] Pager: [phone] 12/05/2011, 9:11 AM

## 2011-12-06 DIAGNOSIS — R112 Nausea with vomiting, unspecified: Secondary | ICD-10-CM

## 2011-12-06 DIAGNOSIS — I10 Essential (primary) hypertension: Secondary | ICD-10-CM

## 2011-12-06 DIAGNOSIS — I4891 Unspecified atrial fibrillation: Secondary | ICD-10-CM

## 2011-12-06 DIAGNOSIS — Z7901 Long term (current) use of anticoagulants: Secondary | ICD-10-CM

## 2011-12-06 LAB — BASIC METABOLIC PANEL
BUN: 9 mg/dL (ref 6–23)
CO2: 23 mEq/L (ref 19–32)
Calcium: 9 mg/dL (ref 8.4–10.5)
Glucose, Bld: 103 mg/dL — ABNORMAL HIGH (ref 70–99)
Sodium: 134 mEq/L — ABNORMAL LOW (ref 135–145)

## 2011-12-06 NOTE — Progress Notes (Signed)
Physical Therapy Treatment Patient Details Name: Charlotte [name] MRN: [id number] DOB: Mar 07, 1941 Today's Date: 12/06/2011 Time: 1200-1229 PT Time Calculation (min): 29 min  PT Assessment / Plan / Recommendation Comments on Treatment Session  Pt. modified independent with all transfers/mobility.  Pt. completed all tasks with proper safety awareness today.  Continued difficulty with AROM of R LE but able to  lift LE to complete supine to sit independently.                Precautions / Restrictions Restrictions Weight Bearing Restrictions: Yes RLE Weight Bearing: Weight bearing as tolerated   Pertinent Vitals/Pain     Mobility  Bed Mobility Bed Mobility: Supine to Sit;Sitting - Scoot to [employer] of Bed Sitting - Scoot to [place] of Bed: 6: Modified independent (Device/Increase time) Sit to Supine: 6: Modified independent (Device/Increase time) Transfers Transfers: Sit to Stand;Stand to Sit Sit to Stand: 6: Modified independent (Device/Increase time) Stand to Sit: 6: Modified independent (Device/Increase time) Ambulation/Gait Ambulation/Gait Assistance: 6: Modified independent (Device/Increase time) Ambulation Distance (Feet): 15 Feet Assistive device: Rolling walker Ambulation/Gait Assistance Details: postural cues but otherwise with good stride and technique Gait Pattern: Trunk flexed;Step-through pattern General Gait Details: slower but with normal stride Stairs: No    Exercises General Exercises - Lower Extremity Ankle Circles/Pumps: AROM;Both;10 reps Heel Slides: AROM;Right;10 reps   PT Goals Acute Rehab PT Goals PT Goal: Sit to Stand - Progress: Met PT Goal: Stand to Sit - Progress: Met PT Transfer Goal: Bed to Chair/Chair to Bed - Progress: Met PT Goal: Ambulate - Progress: Progressing toward goal PT Goal: Up/Down Stairs - Progress: Not met  Visit Information  Last PT Received On: 12/06/11    Subjective Data  Subjective: Pt. states she is not having any pain;  reports she has been having problems with her heart rate.         End of Session PT - End of Session Equipment Utilized During Treatment: Gait belt Activity Tolerance: Patient tolerated treatment well Patient left: in chair;with call bell/phone within reach    Lendy Dittrich B. Mare Ferrari, PTA 12/06/2011, 12:39 PM

## 2011-12-06 NOTE — Progress Notes (Signed)
Subjective: Patient just got back to bed, has some pain in her leg.  No shortness of breath at rest but does feel short of breath on exertion.  Objective: Vital signs in last 24 hours: Temp:  [98.1 F (36.7 C)-98.3 F (36.8 C)] 98.3 F (36.8 C) (05/04 0623) Pulse Rate:  [82-114] 114  (05/04 0623) Resp:  [16-18] 18  (05/04 0800) BP: (130-167)/(76-104) 167/104 mmHg (05/04 0623) SpO2:  [89 %-93 %] 92 % (05/04 0800) Weight change:  Last BM Date: 12/04/11  Intake/Output from previous day: 05/03 0701 - 05/04 0700 In: 600 [P.O.:600] Out: 1625 [Urine:1625] Total I/O In: 320 [P.O.:320] Out: 900 [Urine:900]   Physical Exam: General: Alert, awake, oriented x3, in no acute distress. HEENT: No bruits, no goiter. Heart: irregular Lungs: crackles at bases Abdomen: Soft, nontender, nondistended, positive bowel sounds. Extremities:1+ edema in LE b/l Neuro: Grossly intact, nonfocal.    Lab Results: Basic Metabolic Panel:  Basename 12/06/11 0620 12/05/11 0506  NA 134* 137  K 3.3* 4.2  CL 99 101  CO2 23 23  GLUCOSE 103* 101*  BUN 9 11  CREATININE 0.47* 0.54  CALCIUM 9.0 9.0  MG -- --  PHOS -- --   Liver Function Tests: No results found for this basename: AST:2,ALT:2,ALKPHOS:2,BILITOT:2,PROT:2,ALBUMIN:2 in the last 72 hours No results found for this basename: LIPASE:2,AMYLASE:2 in the last 72 hours No results found for this basename: AMMONIA:2 in the last 72 hours CBC:  Basename 12/04/11 0528  WBC 5.4  NEUTROABS --  HGB 10.9*  HCT 31.5*  MCV 107.5*  PLT 312   Cardiac Enzymes: No results found for this basename: CKTOTAL:3,CKMB:3,CKMBINDEX:3,TROPONINI:3 in the last 72 hours BNP:  Basename 12/05/11 0506  PROBNP 2256.0*   D-Dimer: No results found for this basename: DDIMER:2 in the last 72 hours CBG: No results found for this basename: GLUCAP:6 in the last 72 hours Hemoglobin A1C: No results found for this basename: HGBA1C in the last 72 hours Fasting Lipid  Panel: No results found for this basename: CHOL,HDL,LDLCALC,TRIG,CHOLHDL,LDLDIRECT in the last 72 hours Thyroid Function Tests: No results found for this basename: TSH,T4TOTAL,FREET4,T3FREE,THYROIDAB in the last 72 hours Anemia Panel: No results found for this basename: VITAMINB12,FOLATE,FERRITIN,TIBC,IRON,RETICCTPCT in the last 72 hours Coagulation:  Basename 12/06/11 0620 12/05/11 0503  LABPROT 19.5* 18.6*  INR 1.62* 1.52*   Urine Drug Screen: Drugs of Abuse  No results found for this basename: labopia, cocainscrnur, labbenz, amphetmu, thcu, labbarb    Alcohol Level: No results found for this basename: ETH:2 in the last 72 hours Urinalysis: No results found for this basename: COLORURINE:2,APPERANCEUR:2,LABSPEC:2,PHURINE:2,GLUCOSEU:2,HGBUR:2,BILIRUBINUR:2,KETONESUR:2,PROTEINUR:2,UROBILINOGEN:2,NITRITE:2,LEUKOCYTESUR:2 in the last 72 hours  No results found for this or any previous visit (from the past 240 hour(s)).  Studies/Results: Dg Chest Port 1 View  12/05/2011  *RADIOLOGY REPORT*  Clinical Data: Shortness of breath and CHF  PORTABLE CHEST - 1 VIEW  Comparison: 12/02/2011  Findings:  Heart size appears mildly enlarged.  There are bilateral pleural effusions and there is interstitial edema.  No focal airspace consolidation.  The visualized osseous structures appear intact.  IMPRESSION:  1.  Mild CHF.  Unchanged from previous exam.  Original Report Authenticated By: Angelita Ingles, M.D.    Medications: Scheduled Meds:   . albuterol  2.5 mg Nebulization BID  . antiseptic oral rinse  15 mL Mouth Rinse BID  . digoxin  0.25 mg Oral Daily  . diltiazem  360 mg Oral Daily  . docusate sodium  100 mg Oral BID  . ferrous sulfate  325  mg Oral TID PC  . furosemide  40 mg Intravenous BID  . gabapentin  100 mg Oral TID  . hydroxyurea  1,000 mg Oral Daily  . hydroxyurea  500 mg Oral QHS  . levothyroxine  25 mcg Oral Daily  . methocarbamol  500 mg Oral QID   Or  . methocarbamol  (ROBAXIN) IV  500 mg Intravenous QID  . metoprolol succinate  100 mg Oral BID  . potassium chloride  40 mEq Oral BID  . senna  1 tablet Oral BID  . sodium chloride      . warfarin  5 mg Oral q1800  . Warfarin - Physician Dosing Inpatient   Does not apply q1800  . DISCONTD: potassium chloride  40 mEq Oral BID   Continuous Infusions:   . sodium chloride 20 mL/hr at 12/03/11 0933   PRN Meds:.acetaminophen, alum & mag hydroxide-simeth, bisacodyl, HYDROmorphone (DILAUDID) injection, magnesium hydroxide, menthol-cetylpyridinium, metoCLOPramide (REGLAN) injection, metoCLOPramide, oxyCODONE, phenol, senna-docusate, temazepam  Assessment/Plan:  1. Acute blood loss anemia. S/p 4 units prbc. Hemoglobin stable at this point   2. Compartment syndrome, s/p fasciotomy and release   3. A fib with RVR. Heart rate still elevated but does not appear to be as high as yesterday.  Continue current management and check digoxin level in am, since she may need another loading dose.  If heart rate fails to stabilize by tomorrow, may need to get cardiology input.   4. 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 Hypertension, stable   6. Acute on chronic diastolic CHF, likely due to transfusions, Still appears to be volume overloaded.  Continue lasix. EF intact on echo. BNP >2000. Continue diuresis.  7. Hypokalemia, replete   LOS: 8 days   MEMON,JEHANZEB [facility] Pager: [phone] 12/06/2011, 2:52 PM

## 2011-12-06 NOTE — Progress Notes (Signed)
Subjective: 4 Days Post-Op Procedure(s) (LRB): FASCIOTOMY CLOSURE (Right) DRESSING CHANGE UNDER ANESTHESIA (Right) Patient reports pain as 2 on 0-10 scale.    Objective: Vital signs in last 24 hours: Temp:  [98.1 F (36.7 C)-98.3 F (36.8 C)] 98.3 F (36.8 C) (05/04 0623) Pulse Rate:  [82-114] 114  (05/04 0623) Resp:  [16-18] 18  (05/04 0623) BP: (130-167)/(76-104) 167/104 mmHg (05/04 0623) SpO2:  [89 %-93 %] 93 % (05/04 0719)  Intake/Output from previous day: 05/03 0701 - 05/04 0700 In: 600 [P.O.:600] Out: 1625 [Urine:1625] Intake/Output this shift: Total I/O In: 120 [P.O.:120] Out: -    Basename 12/04/11 0528  HGB 10.9*    Basename 12/04/11 0528  WBC 5.4  RBC 2.93*  HCT 31.5*  PLT 312    Basename 12/06/11 0620 12/05/11 0506  NA 134* 137  K 3.3* 4.2  CL 99 101  CO2 23 23  BUN 9 11  CREATININE 0.47* 0.54  GLUCOSE 103* 101*  CALCIUM 9.0 9.0    Basename 12/06/11 0620 12/05/11 0503  LABPT -- --  INR 1.62* 1.52*    Neurologically intact Neurovascular intact Sensation intact distally Intact pulses distally Dorsiflexion/Plantar flexion intact Compartment soft  Assessment/Plan: 4 Days Post-Op Procedure(s) (LRB): FASCIOTOMY CLOSURE (Right) DRESSING CHANGE UNDER ANESTHESIA (Right) Up with therapy Correct k Check mg   Arther Abbott 12/06/2011, 8:29 AM

## 2011-12-07 DIAGNOSIS — Z7901 Long term (current) use of anticoagulants: Secondary | ICD-10-CM

## 2011-12-07 DIAGNOSIS — I4891 Unspecified atrial fibrillation: Secondary | ICD-10-CM

## 2011-12-07 DIAGNOSIS — R112 Nausea with vomiting, unspecified: Secondary | ICD-10-CM

## 2011-12-07 DIAGNOSIS — I10 Essential (primary) hypertension: Secondary | ICD-10-CM

## 2011-12-07 LAB — BASIC METABOLIC PANEL
BUN: 10 mg/dL (ref 6–23)
CO2: 24 mEq/L (ref 19–32)
Chloride: 97 mEq/L (ref 96–112)
GFR calc Af Amer: >90 mL/min (ref >90)
Potassium: 3.5 mEq/L (ref 3.5–5.1)

## 2011-12-07 LAB — HEMOGLOBIN AND HEMATOCRIT, BLOOD: Hemoglobin: 11.8 g/dL — ABNORMAL LOW (ref 12.0–15.0)

## 2011-12-07 LAB — PROTIME-INR: INR: 1.85 — ABNORMAL HIGH (ref 0.00–1.49)

## 2011-12-07 LAB — MAGNESIUM: Magnesium: 1.7 mg/dL (ref 1.5–2.5)

## 2011-12-07 LAB — DIGOXIN LEVEL: Digoxin Level: 0.5 ng/mL — ABNORMAL LOW (ref 0.8–2.0)

## 2011-12-07 MED ORDER — POTASSIUM CHLORIDE CRYS ER 20 MEQ PO TBCR: 40.0000 meq | EXTENDED_RELEASE_TABLET | Freq: Two times a day (BID) | ORAL | Status: DC

## 2011-12-07 MED ORDER — FUROSEMIDE 40 MG PO TABS: 40.0000 mg | ORAL_TABLET | Freq: Two times a day (BID) | ORAL | Status: DC

## 2011-12-07 MED ORDER — DILTIAZEM HCL ER COATED BEADS 360 MG PO CP24: 360.0000 mg | ORAL_CAPSULE | Freq: Every day | ORAL | Status: DC

## 2011-12-07 MED ORDER — DIGOXIN 250 MCG PO TABS: 0.2500 mg | ORAL_TABLET | Freq: Every day | ORAL | Status: DC

## 2011-12-07 MED ORDER — DIGOXIN 0.25 MG/ML IJ SOLN
0.5000 mg | Freq: Once | INTRAMUSCULAR | Status: AC
  Administered 2011-12-07: 0.5 mg via INTRAVENOUS
  Filled 2011-12-07: qty 2

## 2011-12-07 NOTE — Progress Notes (Signed)
Subjective: No complaints, pain controlled, no shortness of breath  Objective: Vital signs in last 24 hours: Temp:  [98 F (36.7 C)-98.5 F (36.9 C)] 98 F (36.7 C) (05/05 0520) Pulse Rate:  [75-108] 75  (05/05 0520) Resp:  [18] 18  (05/05 0520) BP: (147-162)/(95-105) 147/100 mmHg (05/05 0520) SpO2:  [89 %-92 %] 91 % (05/05 0715) Weight change:  Last BM Date: 12/04/11  Intake/Output from previous day: 05/04 0701 - 05/05 0700 In: 1100 [P.O.:1100] Out: 2725 [Urine:2725] Total I/O In: 240 [P.O.:240] Out: -    Physical Exam: General: Alert, awake, oriented x3, in no acute distress. HEENT: No bruits, no goiter. Heart: irregular Lungs: Clear to auscultation bilaterally. Abdomen: Soft, nontender, nondistended, positive bowel sounds. Extremities: +edema b/l Neuro: Grossly intact, nonfocal.    Lab Results: Basic Metabolic Panel:  Basename 12/07/11 0635 12/06/11 0620  NA 133* 134*  K 3.5 3.3*  CL 97 99  CO2 24 23  GLUCOSE 112* 103*  BUN 10 9  CREATININE 0.51 0.47*  CALCIUM 9.0 9.0  MG 1.7 --  PHOS -- --   Liver Function Tests: No results found for this basename: AST:2,ALT:2,ALKPHOS:2,BILITOT:2,PROT:2,ALBUMIN:2 in the last 72 hours No results found for this basename: LIPASE:2,AMYLASE:2 in the last 72 hours No results found for this basename: AMMONIA:2 in the last 72 hours CBC:  Basename 12/07/11 0635  WBC --  NEUTROABS --  HGB 11.8*  HCT 34.7*  MCV --  PLT --   Cardiac Enzymes: No results found for this basename: CKTOTAL:3,CKMB:3,CKMBINDEX:3,TROPONINI:3 in the last 72 hours BNP:  Basename 12/05/11 0506  PROBNP 2256.0*   D-Dimer: No results found for this basename: DDIMER:2 in the last 72 hours CBG: No results found for this basename: GLUCAP:6 in the last 72 hours Hemoglobin A1C: No results found for this basename: HGBA1C in the last 72 hours Fasting Lipid Panel: No results found for this basename: CHOL,HDL,LDLCALC,TRIG,CHOLHDL,LDLDIRECT in the last  72 hours Thyroid Function Tests: No results found for this basename: TSH,T4TOTAL,FREET4,T3FREE,THYROIDAB in the last 72 hours Anemia Panel: No results found for this basename: VITAMINB12,FOLATE,FERRITIN,TIBC,IRON,RETICCTPCT in the last 72 hours Coagulation:  Basename 12/07/11 0635 12/06/11 0620  LABPROT 21.7* 19.5*  INR 1.85* 1.62*   Urine Drug Screen: Drugs of Abuse  No results found for this basename: labopia, cocainscrnur, labbenz, amphetmu, thcu, labbarb    Alcohol Level: No results found for this basename: ETH:2 in the last 72 hours Urinalysis: No results found for this basename: COLORURINE:2,APPERANCEUR:2,LABSPEC:2,PHURINE:2,GLUCOSEU:2,HGBUR:2,BILIRUBINUR:2,KETONESUR:2,PROTEINUR:2,UROBILINOGEN:2,NITRITE:2,LEUKOCYTESUR:2 in the last 72 hours  No results found for this or any previous visit (from the past 240 hour(s)).  Studies/Results: Dg Chest Port 1 View  12/05/2011  *RADIOLOGY REPORT*  Clinical Data: Shortness of breath and CHF  PORTABLE CHEST - 1 VIEW  Comparison: 12/02/2011  Findings:  Heart size appears mildly enlarged.  There are bilateral pleural effusions and there is interstitial edema.  No focal airspace consolidation.  The visualized osseous structures appear intact.  IMPRESSION:  1.  Mild CHF.  Unchanged from previous exam.  Original Report Authenticated By: Angelita Ingles, M.D.    Medications: Scheduled Meds:   . albuterol  2.5 mg Nebulization BID  . antiseptic oral rinse  15 mL Mouth Rinse BID  . digoxin  0.25 mg Oral Daily  . diltiazem  360 mg Oral Daily  . docusate sodium  100 mg Oral BID  . ferrous sulfate  325 mg Oral TID PC  . furosemide  40 mg Intravenous BID  . gabapentin  100 mg Oral TID  .  hydroxyurea  1,000 mg Oral Daily  . hydroxyurea  500 mg Oral QHS  . levothyroxine  25 mcg Oral Daily  . methocarbamol  500 mg Oral QID   Or  . methocarbamol (ROBAXIN) IV  500 mg Intravenous QID  . metoprolol succinate  100 mg Oral BID  . potassium chloride   40 mEq Oral BID  . senna  1 tablet Oral BID  . sodium chloride      . warfarin  5 mg Oral q1800  . Warfarin - Physician Dosing Inpatient   Does not apply q1800   Continuous Infusions:   . sodium chloride 20 mL/hr at 12/03/11 0933   PRN Meds:.acetaminophen, alum & mag hydroxide-simeth, bisacodyl, HYDROmorphone (DILAUDID) injection, magnesium hydroxide, menthol-cetylpyridinium, metoCLOPramide (REGLAN) injection, metoCLOPramide, oxyCODONE, phenol, senna-docusate, temazepam  Assessment/Plan:  1. Acute blood loss anemia. S/p 4 units prbc. Hemoglobin stable at this point   2. Compartment syndrome, s/p fasciotomy and release   3. A fib with RVR. Heart appears to be improved today.  Mostly running in 90's to low 100s overnight.  Her digoxin level is low, so we will give one more IV dose.  Cont digoxin, toprol and cardizem for rate control.  She is on anticoagulation.  4. 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 Hypertension, stable   6. Acute on chronic diastolic CHF, likely due to transfusions, Although she still appears to have some excess volume, I think we can transition her to oral lasix.  She will likely need to go home with some oxygen which can be titrated off as an outpatient.   7. Hypokalemia, replete  Disposition. Patient reports that she will plan on close follow up with her cardiologist this week.  From my perspective, I think she can discharge home today and follow with her cardiologist this coming week.  I have ordered home oxygen and home PT/RN.    LOS: 9 days   Charlotte [name] [facility] Pager: [phone] 12/07/2011, 9:00 AM

## 2011-12-07 NOTE — Progress Notes (Signed)
Subjective: 5 Days Post-Op Procedure(s) (LRB): FASCIOTOMY CLOSURE (Right) DRESSING CHANGE UNDER ANESTHESIA (Right) Patient reports pain as mild.    Objective: Vital signs in last 24 hours: Temp:  [98 F (36.7 C)-98.5 F (36.9 C)] 98 F (36.7 C) (05/05 0520) Pulse Rate:  [75-108] 75  (05/05 0520) Resp:  [18] 18  (05/05 0520) BP: (147-162)/(95-105) 147/100 mmHg (05/05 0520) SpO2:  [89 %-92 %] 91 % (05/05 0715)  Intake/Output from previous day: 05/04 0701 - 05/05 0700 In: 1100 [P.O.:1100] Out: 2725 [Urine:2725] Intake/Output this shift:     Basename 12/07/11 0635  HGB 11.8*    Basename 12/07/11 0635  WBC --  RBC --  HCT 34.7*  PLT --    Basename 12/06/11 0620 12/05/11 0506  NA 134* 137  K 3.3* 4.2  CL 99 101  CO2 23 23  BUN 9 11  CREATININE 0.47* 0.54  GLUCOSE 103* 101*  CALCIUM 9.0 9.0    Basename 12/06/11 0620 12/05/11 0503  LABPT -- --  INR 1.62* 1.52*    Neurologically intact  Assessment/Plan: 5 Days Post-Op Procedure(s) (LRB): FASCIOTOMY CLOSURE (Right) DRESSING CHANGE UNDER ANESTHESIA (Right) Up with therapy Potassium correction HR correction  Arther Abbott 12/07/2011, 7:21 AM

## 2011-12-08 ENCOUNTER — Other Ambulatory Visit (HOSPITAL_COMMUNITY): Payer: Medicare Other

## 2011-12-08 DIAGNOSIS — Z7901 Long term (current) use of anticoagulants: Secondary | ICD-10-CM

## 2011-12-08 DIAGNOSIS — I4891 Unspecified atrial fibrillation: Secondary | ICD-10-CM

## 2011-12-08 DIAGNOSIS — R112 Nausea with vomiting, unspecified: Secondary | ICD-10-CM

## 2011-12-08 DIAGNOSIS — I10 Essential (primary) hypertension: Secondary | ICD-10-CM

## 2011-12-08 LAB — BASIC METABOLIC PANEL
Chloride: 99 mEq/L (ref 96–112)
GFR calc Af Amer: >90 mL/min (ref >90)
GFR calc non Af Amer: >90 mL/min (ref >90)
Potassium: 3.8 mEq/L (ref 3.5–5.1)
Sodium: 134 mEq/L — ABNORMAL LOW (ref 135–145)

## 2011-12-08 LAB — HEMOGLOBIN AND HEMATOCRIT, BLOOD
HCT: 34 % — ABNORMAL LOW (ref 36.0–46.0)
Hemoglobin: 11.5 g/dL — ABNORMAL LOW (ref 12.0–15.0)

## 2011-12-08 LAB — PROTIME-INR
INR: 1.81 — ABNORMAL HIGH (ref 0.00–1.49)
Prothrombin Time: 21.3 seconds — ABNORMAL HIGH (ref 11.6–15.2)

## 2011-12-08 LAB — MAGNESIUM: Magnesium: 1.7 mg/dL (ref 1.5–2.5)

## 2011-12-08 MED ORDER — WARFARIN SODIUM 5 MG PO TABS: 5.0000 mg | ORAL_TABLET | ORAL | Status: DC

## 2011-12-08 MED ORDER — WARFARIN SODIUM 7.5 MG PO TABS: 7.5000 mg | ORAL_TABLET | ORAL | Status: DC

## 2011-12-08 MED ORDER — ASPIRIN 81 MG PO CHEW
81.0000 mg | CHEWABLE_TABLET | Freq: Every day | ORAL | Status: DC
  Administered 2011-12-08: 81 mg via ORAL
  Filled 2011-12-08: qty 1

## 2011-12-08 MED ORDER — FUROSEMIDE 40 MG PO TABS: 40.0000 mg | ORAL_TABLET | Freq: Two times a day (BID) | ORAL | Status: DC

## 2011-12-08 NOTE — Progress Notes (Signed)
Brief Nutrition Note  Chart reviewed due to LOS (day 10). Pt is on a heart healthy diet. Good intake; PO: 40-100%. Noted pt to be discharged on 12/06/11, but discharged cancelled due to increased heart rate. Per Hospitalist notes, pt medically stable for discharge. No nutrition recommendations or interventions at this time. Continue with current nutrition plan of care.  Joaquim Lai, RD, LDN Pager: [phone]

## 2011-12-08 NOTE — Progress Notes (Signed)
Pt did not qualify for O2 because she does not have a chronic pulmonary disease. Charlotte [name] D

## 2011-12-08 NOTE — Progress Notes (Signed)
Physical Therapy Treatment Patient Details Name: Charlotte [name] MRN: [id number] DOB: 09-03-1940 Today's Date: 12/08/2011 Time:  -    Patient being d/c to home today. Spoke with patient regarding stair training before leaving hospital and she and husband comfortable with negotiating stairs at home without practicing here due to having been trained here 2 weeks ago with prior admission. Educated safety points of stairs again for reinforcement.   Donnell Beauchamp ATKINSO 12/08/2011, 12:26 PM

## 2011-12-08 NOTE — Progress Notes (Signed)
12/08/11 1316 Patient being discharged home. Reviewed discharge instructions with patient and husband at bedside via teachback method, verbalize understanding. Given copy of instructions, med list, prescriptions, and follow-up appointments. Some prescriptions called in to pharmacy per dr Aline Brochure. IV site d/c'd, site clean and dry, telemetry d/c'd. [facility]. Home O2 to be delivered to hospital prior to discharge per case management. Denies pain or discomfort, dressings changed and dry and intact. Pt in stable condition awaiting discharge.

## 2011-12-08 NOTE — Progress Notes (Signed)
Subjective: This lady has been doing well. There is no dyspnea, cough, palpitations or chest pain. She has had no fever. However ventricular rate is fairly well controlled with her atrial fibrillation.          Physical Exam: Blood pressure 150/91, pulse 94, temperature 98.8 F (37.1 C), temperature source Oral, resp. rate 18, height 6' (1.829 m), weight 98.1 kg (216 lb 4.3 oz), SpO2 97.00%. She looks systemically well. Heart sounds are present and appear to be in atrial fibrillation. Lung fields are entirely clear. She is now clinically in heart failure. There is no peripheral pitting edema. She is alert and orientated.   Investigations:     Basic Metabolic Panel:  Basename 12/08/11 0516 12/07/11 0635  NA 134* 133*  K 3.8 3.5  CL 99 97  CO2 23 24  GLUCOSE 108* 112*  BUN 12 10  CREATININE 0.55 0.51  CALCIUM 9.0 9.0  MG 1.7 1.7  PHOS -- --       CBC:  Basename 12/08/11 0516 12/07/11 0635  WBC -- --  NEUTROABS -- --  HGB 11.5* 11.8*  HCT 34.0* 34.7*  MCV -- --  PLT -- --        Medications:  Scheduled:   . albuterol  2.5 mg Nebulization BID  . antiseptic oral rinse  15 mL Mouth Rinse BID  . aspirin  81 mg Oral Daily  . digoxin  0.5 mg Intravenous Once  . digoxin  0.25 mg Oral Daily  . diltiazem  360 mg Oral Daily  . docusate sodium  100 mg Oral BID  . ferrous sulfate  325 mg Oral TID PC  . furosemide  40 mg Oral BID  . gabapentin  100 mg Oral TID  . hydroxyurea  1,000 mg Oral Daily  . hydroxyurea  500 mg Oral QHS  . levothyroxine  25 mcg Oral Daily  . methocarbamol  500 mg Oral QID   Or  . methocarbamol (ROBAXIN) IV  500 mg Intravenous QID  . metoprolol succinate  100 mg Oral BID  . potassium chloride  40 mEq Oral BID  . senna  1 tablet Oral BID  . warfarin  5 mg Oral Q T,Th,S,Su-1800  . warfarin  7.5 mg Oral Q M,W,F-1800  . Warfarin - Physician Dosing Inpatient   Does not apply q1800  . DISCONTD: furosemide  40 mg Intravenous BID  .  DISCONTD: warfarin  5 mg Oral q1800  . DISCONTD: warfarin  5 mg Oral QODAY  . DISCONTD: warfarin  7.5 mg Oral QODAY    Impression: 1. Acute blood loss anemia. S/p 4 units prbc. Hemoglobin stable at this point  2. Compartment syndrome, s/p fasciotomy and release  3. A fib with RVR. Heart appears to be improved. Mostly running in 90's to low 100s overnight.  Cont digoxin, toprol and cardizem for rate control. She is on anticoagulation.  4. 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 Hypertension, stable  6. Acute on chronic diastolic CHF, likely due to transfusions, Although she still appears to have some excess volume, I think we can transition her to oral lasix. She will likely need to go home with some oxygen which can be titrated off as an outpatient.       Plan: 1. Discontinue her IV Lasix and started her on oral Lasix. 2. From my point of view, she is now medically stable, and can be discharged home or skilled nursing facility for rehabilitation, as felt appropriate by Dr. Aline Brochure.  3. I will sign off. Please call/consult again if required.     LOS: 10 days   Doree Albee Pager [phone]  12/08/2011, 9:31 AM

## 2011-12-09 DIAGNOSIS — I4891 Unspecified atrial fibrillation: Secondary | ICD-10-CM | POA: Diagnosis not present

## 2011-12-09 DIAGNOSIS — Z7901 Long term (current) use of anticoagulants: Secondary | ICD-10-CM | POA: Diagnosis not present

## 2011-12-09 DIAGNOSIS — S7290XD Unspecified fracture of unspecified femur, subsequent encounter for closed fracture with routine healing: Secondary | ICD-10-CM | POA: Diagnosis not present

## 2011-12-09 DIAGNOSIS — I10 Essential (primary) hypertension: Secondary | ICD-10-CM | POA: Diagnosis not present

## 2011-12-09 DIAGNOSIS — Z5181 Encounter for therapeutic drug level monitoring: Secondary | ICD-10-CM | POA: Diagnosis not present

## 2011-12-09 DIAGNOSIS — G609 Hereditary and idiopathic neuropathy, unspecified: Secondary | ICD-10-CM | POA: Diagnosis not present

## 2011-12-10 DIAGNOSIS — I10 Essential (primary) hypertension: Secondary | ICD-10-CM | POA: Diagnosis not present

## 2011-12-10 DIAGNOSIS — G609 Hereditary and idiopathic neuropathy, unspecified: Secondary | ICD-10-CM | POA: Diagnosis not present

## 2011-12-10 DIAGNOSIS — Z7901 Long term (current) use of anticoagulants: Secondary | ICD-10-CM | POA: Diagnosis not present

## 2011-12-10 DIAGNOSIS — S7290XD Unspecified fracture of unspecified femur, subsequent encounter for closed fracture with routine healing: Secondary | ICD-10-CM | POA: Diagnosis not present

## 2011-12-10 DIAGNOSIS — Z5181 Encounter for therapeutic drug level monitoring: Secondary | ICD-10-CM | POA: Diagnosis not present

## 2011-12-10 DIAGNOSIS — I4891 Unspecified atrial fibrillation: Secondary | ICD-10-CM | POA: Diagnosis not present

## 2011-12-11 DIAGNOSIS — Z7901 Long term (current) use of anticoagulants: Secondary | ICD-10-CM | POA: Diagnosis not present

## 2011-12-11 DIAGNOSIS — G609 Hereditary and idiopathic neuropathy, unspecified: Secondary | ICD-10-CM | POA: Diagnosis not present

## 2011-12-11 DIAGNOSIS — I10 Essential (primary) hypertension: Secondary | ICD-10-CM | POA: Diagnosis not present

## 2011-12-11 DIAGNOSIS — Z5181 Encounter for therapeutic drug level monitoring: Secondary | ICD-10-CM | POA: Diagnosis not present

## 2011-12-11 DIAGNOSIS — I4891 Unspecified atrial fibrillation: Secondary | ICD-10-CM | POA: Diagnosis not present

## 2011-12-11 DIAGNOSIS — S7290XD Unspecified fracture of unspecified femur, subsequent encounter for closed fracture with routine healing: Secondary | ICD-10-CM | POA: Diagnosis not present

## 2011-12-15 ENCOUNTER — Ambulatory Visit (INDEPENDENT_AMBULATORY_CARE_PROVIDER_SITE_OTHER): Payer: Medicare Other | Admitting: Orthopedic Surgery

## 2011-12-15 ENCOUNTER — Ambulatory Visit: Payer: Medicare Other | Admitting: Orthopedic Surgery

## 2011-12-15 ENCOUNTER — Encounter: Payer: Self-pay | Admitting: Orthopedic Surgery

## 2011-12-15 ENCOUNTER — Other Ambulatory Visit: Payer: Self-pay | Admitting: *Deleted

## 2011-12-15 VITALS — BP 120/78 | Ht 72.0 in | Wt 216.0 lb

## 2011-12-15 DIAGNOSIS — M79A29 Nontraumatic compartment syndrome of unspecified lower extremity: Secondary | ICD-10-CM

## 2011-12-15 DIAGNOSIS — S7290XD Unspecified fracture of unspecified femur, subsequent encounter for closed fracture with routine healing: Secondary | ICD-10-CM | POA: Diagnosis not present

## 2011-12-15 DIAGNOSIS — Z7901 Long term (current) use of anticoagulants: Secondary | ICD-10-CM | POA: Diagnosis not present

## 2011-12-15 DIAGNOSIS — Z5181 Encounter for therapeutic drug level monitoring: Secondary | ICD-10-CM | POA: Diagnosis not present

## 2011-12-15 DIAGNOSIS — I4891 Unspecified atrial fibrillation: Secondary | ICD-10-CM | POA: Diagnosis not present

## 2011-12-15 DIAGNOSIS — G609 Hereditary and idiopathic neuropathy, unspecified: Secondary | ICD-10-CM | POA: Diagnosis not present

## 2011-12-15 DIAGNOSIS — S72001A Fracture of unspecified part of neck of right femur, initial encounter for closed fracture: Secondary | ICD-10-CM

## 2011-12-15 DIAGNOSIS — I10 Essential (primary) hypertension: Secondary | ICD-10-CM | POA: Diagnosis not present

## 2011-12-15 MED ORDER — OXYCODONE HCL 5 MG PO TABS: 5.0000 mg | ORAL_TABLET | ORAL | Status: AC | PRN

## 2011-12-15 NOTE — Patient Instructions (Signed)
Please go to [facility] for chest x ray  F/u with Dr Luna Glasgow for hip fracture   Dr Hilma Favors for chest pain

## 2011-12-15 NOTE — Progress Notes (Signed)
Patient ID: Charlotte [name], female   DOB: 03/26/1941, 71 y.o.   MRN: [id number] Chief Complaint  Patient presents with  . Follow-up    Hospital follow up on right thigh, wound check.    The incision has been clean. The staples were removed.  She did complain of some right-sided chest pain. We know that in the hospital. She had some fluid in her lungs and we are going to send her for a chest x-ray. She's not short of breath and her blood pressure was good. Today.  She will follow up for her hip fracture with Dr Luna Glasgow and she will also followup with Dr. Hilma Favors, pending the results of the chest x-ray.

## 2011-12-16 ENCOUNTER — Ambulatory Visit (HOSPITAL_COMMUNITY)
Admission: RE | Admit: 2011-12-16 | Discharge: 2011-12-16 | Disposition: A | Discharge status: Home / Self Care | Payer: Medicare Other | Source: Ambulatory Visit | Attending: Orthopedic Surgery | Admitting: Orthopedic Surgery

## 2011-12-16 ENCOUNTER — Other Ambulatory Visit: Payer: Self-pay | Admitting: Orthopedic Surgery

## 2011-12-16 ENCOUNTER — Telehealth: Payer: Self-pay | Admitting: *Deleted

## 2011-12-16 DIAGNOSIS — R071 Chest pain on breathing: Secondary | ICD-10-CM | POA: Insufficient documentation

## 2011-12-16 DIAGNOSIS — R0789 Other chest pain: Secondary | ICD-10-CM

## 2011-12-16 DIAGNOSIS — Z8709 Personal history of other diseases of the respiratory system: Secondary | ICD-10-CM | POA: Diagnosis not present

## 2011-12-16 DIAGNOSIS — J9819 Other pulmonary collapse: Secondary | ICD-10-CM | POA: Insufficient documentation

## 2011-12-16 DIAGNOSIS — J90 Pleural effusion, not elsewhere classified: Secondary | ICD-10-CM | POA: Diagnosis not present

## 2011-12-16 DIAGNOSIS — I10 Essential (primary) hypertension: Secondary | ICD-10-CM | POA: Diagnosis not present

## 2011-12-16 DIAGNOSIS — G609 Hereditary and idiopathic neuropathy, unspecified: Secondary | ICD-10-CM | POA: Diagnosis not present

## 2011-12-16 DIAGNOSIS — Z5181 Encounter for therapeutic drug level monitoring: Secondary | ICD-10-CM | POA: Diagnosis not present

## 2011-12-16 DIAGNOSIS — J438 Other emphysema: Secondary | ICD-10-CM | POA: Diagnosis not present

## 2011-12-16 DIAGNOSIS — I4891 Unspecified atrial fibrillation: Secondary | ICD-10-CM | POA: Diagnosis not present

## 2011-12-16 DIAGNOSIS — S7290XD Unspecified fracture of unspecified femur, subsequent encounter for closed fracture with routine healing: Secondary | ICD-10-CM | POA: Diagnosis not present

## 2011-12-16 DIAGNOSIS — Z7901 Long term (current) use of anticoagulants: Secondary | ICD-10-CM | POA: Diagnosis not present

## 2011-12-17 DIAGNOSIS — Z5181 Encounter for therapeutic drug level monitoring: Secondary | ICD-10-CM | POA: Diagnosis not present

## 2011-12-17 DIAGNOSIS — G609 Hereditary and idiopathic neuropathy, unspecified: Secondary | ICD-10-CM | POA: Diagnosis not present

## 2011-12-17 DIAGNOSIS — S7290XD Unspecified fracture of unspecified femur, subsequent encounter for closed fracture with routine healing: Secondary | ICD-10-CM | POA: Diagnosis not present

## 2011-12-17 DIAGNOSIS — I10 Essential (primary) hypertension: Secondary | ICD-10-CM | POA: Diagnosis not present

## 2011-12-17 DIAGNOSIS — I4891 Unspecified atrial fibrillation: Secondary | ICD-10-CM | POA: Diagnosis not present

## 2011-12-17 DIAGNOSIS — Z7901 Long term (current) use of anticoagulants: Secondary | ICD-10-CM | POA: Diagnosis not present

## 2011-12-17 NOTE — Telephone Encounter (Signed)
Weightbearing as tolerated per dr Aline Brochure

## 2011-12-17 NOTE — Telephone Encounter (Signed)
Nurse aware 

## 2011-12-17 NOTE — Telephone Encounter (Signed)
This info is ion the chart and discharge summary

## 2011-12-18 ENCOUNTER — Ambulatory Visit (HOSPITAL_COMMUNITY): Payer: Medicare Other | Admitting: Oncology

## 2011-12-18 DIAGNOSIS — I4891 Unspecified atrial fibrillation: Secondary | ICD-10-CM | POA: Diagnosis not present

## 2011-12-18 DIAGNOSIS — Z7901 Long term (current) use of anticoagulants: Secondary | ICD-10-CM | POA: Diagnosis not present

## 2011-12-18 DIAGNOSIS — Z5181 Encounter for therapeutic drug level monitoring: Secondary | ICD-10-CM | POA: Diagnosis not present

## 2011-12-18 DIAGNOSIS — S7290XD Unspecified fracture of unspecified femur, subsequent encounter for closed fracture with routine healing: Secondary | ICD-10-CM | POA: Diagnosis not present

## 2011-12-18 DIAGNOSIS — G609 Hereditary and idiopathic neuropathy, unspecified: Secondary | ICD-10-CM | POA: Diagnosis not present

## 2011-12-18 DIAGNOSIS — I10 Essential (primary) hypertension: Secondary | ICD-10-CM | POA: Diagnosis not present

## 2011-12-19 DIAGNOSIS — Z5181 Encounter for therapeutic drug level monitoring: Secondary | ICD-10-CM | POA: Diagnosis not present

## 2011-12-19 DIAGNOSIS — I4891 Unspecified atrial fibrillation: Secondary | ICD-10-CM | POA: Diagnosis not present

## 2011-12-19 DIAGNOSIS — I10 Essential (primary) hypertension: Secondary | ICD-10-CM | POA: Diagnosis not present

## 2011-12-19 DIAGNOSIS — S7290XD Unspecified fracture of unspecified femur, subsequent encounter for closed fracture with routine healing: Secondary | ICD-10-CM | POA: Diagnosis not present

## 2011-12-19 DIAGNOSIS — G609 Hereditary and idiopathic neuropathy, unspecified: Secondary | ICD-10-CM | POA: Diagnosis not present

## 2011-12-19 DIAGNOSIS — Z7901 Long term (current) use of anticoagulants: Secondary | ICD-10-CM | POA: Diagnosis not present

## 2011-12-22 ENCOUNTER — Ambulatory Visit: Payer: Medicare Other | Admitting: Orthopedic Surgery

## 2011-12-22 DIAGNOSIS — Z7901 Long term (current) use of anticoagulants: Secondary | ICD-10-CM | POA: Diagnosis not present

## 2011-12-22 DIAGNOSIS — Z5181 Encounter for therapeutic drug level monitoring: Secondary | ICD-10-CM | POA: Diagnosis not present

## 2011-12-22 DIAGNOSIS — I4891 Unspecified atrial fibrillation: Secondary | ICD-10-CM | POA: Diagnosis not present

## 2011-12-22 DIAGNOSIS — I10 Essential (primary) hypertension: Secondary | ICD-10-CM | POA: Diagnosis not present

## 2011-12-22 DIAGNOSIS — S7290XD Unspecified fracture of unspecified femur, subsequent encounter for closed fracture with routine healing: Secondary | ICD-10-CM | POA: Diagnosis not present

## 2011-12-22 DIAGNOSIS — G609 Hereditary and idiopathic neuropathy, unspecified: Secondary | ICD-10-CM | POA: Diagnosis not present

## 2011-12-23 DIAGNOSIS — Z5181 Encounter for therapeutic drug level monitoring: Secondary | ICD-10-CM | POA: Diagnosis not present

## 2011-12-23 DIAGNOSIS — I10 Essential (primary) hypertension: Secondary | ICD-10-CM | POA: Diagnosis not present

## 2011-12-23 DIAGNOSIS — Z7901 Long term (current) use of anticoagulants: Secondary | ICD-10-CM | POA: Diagnosis not present

## 2011-12-23 DIAGNOSIS — I4891 Unspecified atrial fibrillation: Secondary | ICD-10-CM | POA: Diagnosis not present

## 2011-12-23 DIAGNOSIS — G609 Hereditary and idiopathic neuropathy, unspecified: Secondary | ICD-10-CM | POA: Diagnosis not present

## 2011-12-23 DIAGNOSIS — S7290XD Unspecified fracture of unspecified femur, subsequent encounter for closed fracture with routine healing: Secondary | ICD-10-CM | POA: Diagnosis not present

## 2011-12-23 DIAGNOSIS — S72143A Displaced intertrochanteric fracture of unspecified femur, initial encounter for closed fracture: Secondary | ICD-10-CM | POA: Diagnosis not present

## 2011-12-24 DIAGNOSIS — I4891 Unspecified atrial fibrillation: Secondary | ICD-10-CM | POA: Diagnosis not present

## 2011-12-24 DIAGNOSIS — Z5181 Encounter for therapeutic drug level monitoring: Secondary | ICD-10-CM | POA: Diagnosis not present

## 2011-12-24 DIAGNOSIS — G609 Hereditary and idiopathic neuropathy, unspecified: Secondary | ICD-10-CM | POA: Diagnosis not present

## 2011-12-24 DIAGNOSIS — Z7901 Long term (current) use of anticoagulants: Secondary | ICD-10-CM | POA: Diagnosis not present

## 2011-12-24 DIAGNOSIS — S7290XD Unspecified fracture of unspecified femur, subsequent encounter for closed fracture with routine healing: Secondary | ICD-10-CM | POA: Diagnosis not present

## 2011-12-24 DIAGNOSIS — I10 Essential (primary) hypertension: Secondary | ICD-10-CM | POA: Diagnosis not present

## 2011-12-25 DIAGNOSIS — S7290XD Unspecified fracture of unspecified femur, subsequent encounter for closed fracture with routine healing: Secondary | ICD-10-CM | POA: Diagnosis not present

## 2011-12-25 DIAGNOSIS — G609 Hereditary and idiopathic neuropathy, unspecified: Secondary | ICD-10-CM | POA: Diagnosis not present

## 2011-12-25 DIAGNOSIS — I10 Essential (primary) hypertension: Secondary | ICD-10-CM | POA: Diagnosis not present

## 2011-12-25 DIAGNOSIS — Z7901 Long term (current) use of anticoagulants: Secondary | ICD-10-CM | POA: Diagnosis not present

## 2011-12-25 DIAGNOSIS — Z5181 Encounter for therapeutic drug level monitoring: Secondary | ICD-10-CM | POA: Diagnosis not present

## 2011-12-25 DIAGNOSIS — I4891 Unspecified atrial fibrillation: Secondary | ICD-10-CM | POA: Diagnosis not present

## 2011-12-26 DIAGNOSIS — Z7901 Long term (current) use of anticoagulants: Secondary | ICD-10-CM | POA: Diagnosis not present

## 2011-12-26 DIAGNOSIS — S7290XD Unspecified fracture of unspecified femur, subsequent encounter for closed fracture with routine healing: Secondary | ICD-10-CM | POA: Diagnosis not present

## 2011-12-26 DIAGNOSIS — I10 Essential (primary) hypertension: Secondary | ICD-10-CM | POA: Diagnosis not present

## 2011-12-26 DIAGNOSIS — I4891 Unspecified atrial fibrillation: Secondary | ICD-10-CM | POA: Diagnosis not present

## 2011-12-26 DIAGNOSIS — Z5181 Encounter for therapeutic drug level monitoring: Secondary | ICD-10-CM | POA: Diagnosis not present

## 2011-12-26 DIAGNOSIS — G609 Hereditary and idiopathic neuropathy, unspecified: Secondary | ICD-10-CM | POA: Diagnosis not present

## 2011-12-30 DIAGNOSIS — G609 Hereditary and idiopathic neuropathy, unspecified: Secondary | ICD-10-CM | POA: Diagnosis not present

## 2011-12-30 DIAGNOSIS — Z7901 Long term (current) use of anticoagulants: Secondary | ICD-10-CM | POA: Diagnosis not present

## 2011-12-30 DIAGNOSIS — I10 Essential (primary) hypertension: Secondary | ICD-10-CM | POA: Diagnosis not present

## 2011-12-30 DIAGNOSIS — S7290XD Unspecified fracture of unspecified femur, subsequent encounter for closed fracture with routine healing: Secondary | ICD-10-CM | POA: Diagnosis not present

## 2011-12-30 DIAGNOSIS — Z5181 Encounter for therapeutic drug level monitoring: Secondary | ICD-10-CM | POA: Diagnosis not present

## 2011-12-30 DIAGNOSIS — I4891 Unspecified atrial fibrillation: Secondary | ICD-10-CM | POA: Diagnosis not present

## 2011-12-31 ENCOUNTER — Ambulatory Visit (HOSPITAL_COMMUNITY): Payer: Medicare Other | Admitting: Oncology

## 2012-01-01 ENCOUNTER — Other Ambulatory Visit (HOSPITAL_COMMUNITY): Payer: Self-pay | Admitting: Oncology

## 2012-01-01 ENCOUNTER — Encounter (HOSPITAL_COMMUNITY): Payer: Medicare Other | Attending: Oncology | Admitting: Oncology

## 2012-01-01 ENCOUNTER — Encounter (HOSPITAL_COMMUNITY): Payer: Medicare Other

## 2012-01-01 VITALS — BP 120/78 | HR 101 | Temp 97.0°F | Ht 72.0 in | Wt 178.0 lb

## 2012-01-01 DIAGNOSIS — G608 Other hereditary and idiopathic neuropathies: Secondary | ICD-10-CM | POA: Diagnosis not present

## 2012-01-01 DIAGNOSIS — D45 Polycythemia vera: Secondary | ICD-10-CM | POA: Diagnosis not present

## 2012-01-01 DIAGNOSIS — G609 Hereditary and idiopathic neuropathy, unspecified: Secondary | ICD-10-CM | POA: Diagnosis not present

## 2012-01-01 DIAGNOSIS — Z7901 Long term (current) use of anticoagulants: Secondary | ICD-10-CM

## 2012-01-01 DIAGNOSIS — I4891 Unspecified atrial fibrillation: Secondary | ICD-10-CM | POA: Diagnosis not present

## 2012-01-01 DIAGNOSIS — Z5181 Encounter for therapeutic drug level monitoring: Secondary | ICD-10-CM | POA: Diagnosis not present

## 2012-01-01 DIAGNOSIS — I10 Essential (primary) hypertension: Secondary | ICD-10-CM | POA: Diagnosis not present

## 2012-01-01 DIAGNOSIS — S7290XD Unspecified fracture of unspecified femur, subsequent encounter for closed fracture with routine healing: Secondary | ICD-10-CM | POA: Diagnosis not present

## 2012-01-01 LAB — COMPREHENSIVE METABOLIC PANEL
ALT: 8 U/L (ref 0–35)
AST: 15 U/L (ref 0–37)
Albumin: 3.4 g/dL — ABNORMAL LOW (ref 3.5–5.2)
Chloride: 99 mEq/L (ref 96–112)
Creatinine, Ser: 0.78 mg/dL (ref 0.50–1.10)
Potassium: 4 mEq/L (ref 3.5–5.1)
Sodium: 138 mEq/L (ref 135–145)
Total Bilirubin: 0.9 mg/dL (ref 0.3–1.2)

## 2012-01-01 LAB — DIFFERENTIAL
Basophils Absolute: 0.1 10*3/uL (ref 0.0–0.1)
Basophils Relative: 1 % (ref 0–1)
Eosinophils Absolute: 0.1 10*3/uL (ref 0.0–0.7)
Eosinophils Relative: 1 % (ref 0–5)
Neutrophils Relative %: 77 % (ref 43–77)

## 2012-01-01 LAB — CBC
MCH: 36.8 pg — ABNORMAL HIGH (ref 26.0–34.0)
MCHC: 33.3 g/dL (ref 30.0–36.0)
Platelets: 746 10*3/uL — ABNORMAL HIGH (ref 150–400)
RBC: 3.8 MIL/uL — ABNORMAL LOW (ref 3.87–5.11)
RDW: 20.3 % — ABNORMAL HIGH (ref 11.5–15.5)
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
## 2012-01-01 NOTE — Patient Instructions (Signed)
[facility]  Discharge Instructions KESHEA CAMAJ  [id number] 04/22/1941 Dr. Everardo All  RECOMMENDATIONS MADE BY THE CONSULTANT AND ANY TEST RESULTS WILL BE SENT TO YOUR REFERRING DOCTOR.   EXAM FINDINGS BY MD TODAY AND SIGNS AND SYMPTOMS TO REPORT TO CLINIC OR PRIMARY MD:  Exam today per Alma: we will call you with instructions on your hydrea based on labs today.   INSTRUCTIONS GIVEN AND DISCUSSED: Lab work monthly  SPECIAL INSTRUCTIONS/FOLLOW-UP: 6 months to see Dr. Tressie Stalker   I acknowledge that I have been informed and understand all the instructions given to me and received a copy. I do not have any more questions at this time, but understand that I may call the [facility] at [facility] at [phone] during business hours should I have any further questions or need assistance in obtaining follow-up care.

## 2012-01-02 DIAGNOSIS — Z7901 Long term (current) use of anticoagulants: Secondary | ICD-10-CM | POA: Diagnosis not present

## 2012-01-02 DIAGNOSIS — I4891 Unspecified atrial fibrillation: Secondary | ICD-10-CM | POA: Diagnosis not present

## 2012-01-02 DIAGNOSIS — Z5181 Encounter for therapeutic drug level monitoring: Secondary | ICD-10-CM | POA: Diagnosis not present

## 2012-01-02 DIAGNOSIS — I10 Essential (primary) hypertension: Secondary | ICD-10-CM | POA: Diagnosis not present

## 2012-01-02 DIAGNOSIS — G609 Hereditary and idiopathic neuropathy, unspecified: Secondary | ICD-10-CM | POA: Diagnosis not present

## 2012-01-02 DIAGNOSIS — S7290XD Unspecified fracture of unspecified femur, subsequent encounter for closed fracture with routine healing: Secondary | ICD-10-CM | POA: Diagnosis not present

## 2012-01-02 DIAGNOSIS — T8140XA Infection following a procedure, unspecified, initial encounter: Secondary | ICD-10-CM | POA: Diagnosis not present

## 2012-01-05 DIAGNOSIS — Z7901 Long term (current) use of anticoagulants: Secondary | ICD-10-CM | POA: Diagnosis not present

## 2012-01-05 DIAGNOSIS — S7290XD Unspecified fracture of unspecified femur, subsequent encounter for closed fracture with routine healing: Secondary | ICD-10-CM | POA: Diagnosis not present

## 2012-01-05 DIAGNOSIS — I4891 Unspecified atrial fibrillation: Secondary | ICD-10-CM | POA: Diagnosis not present

## 2012-01-05 DIAGNOSIS — G609 Hereditary and idiopathic neuropathy, unspecified: Secondary | ICD-10-CM | POA: Diagnosis not present

## 2012-01-05 DIAGNOSIS — Z5181 Encounter for therapeutic drug level monitoring: Secondary | ICD-10-CM | POA: Diagnosis not present

## 2012-01-05 DIAGNOSIS — I10 Essential (primary) hypertension: Secondary | ICD-10-CM | POA: Diagnosis not present

## 2012-01-07 ENCOUNTER — Encounter: Payer: Self-pay | Admitting: Orthopedic Surgery

## 2012-01-07 ENCOUNTER — Telehealth: Payer: Self-pay | Admitting: Orthopedic Surgery

## 2012-01-07 ENCOUNTER — Ambulatory Visit (INDEPENDENT_AMBULATORY_CARE_PROVIDER_SITE_OTHER): Payer: Medicare Other | Admitting: Orthopedic Surgery

## 2012-01-07 ENCOUNTER — Other Ambulatory Visit (HOSPITAL_COMMUNITY): Payer: Medicare Other

## 2012-01-07 VITALS — BP 104/78 | Ht 72.0 in | Wt 178.0 lb

## 2012-01-07 DIAGNOSIS — I10 Essential (primary) hypertension: Secondary | ICD-10-CM | POA: Diagnosis not present

## 2012-01-07 DIAGNOSIS — S7290XD Unspecified fracture of unspecified femur, subsequent encounter for closed fracture with routine healing: Secondary | ICD-10-CM | POA: Diagnosis not present

## 2012-01-07 DIAGNOSIS — T8140XA Infection following a procedure, unspecified, initial encounter: Secondary | ICD-10-CM

## 2012-01-07 DIAGNOSIS — G609 Hereditary and idiopathic neuropathy, unspecified: Secondary | ICD-10-CM | POA: Diagnosis not present

## 2012-01-07 DIAGNOSIS — T8149XA Infection following a procedure, other surgical site, initial encounter: Secondary | ICD-10-CM

## 2012-01-07 DIAGNOSIS — I4891 Unspecified atrial fibrillation: Secondary | ICD-10-CM | POA: Diagnosis not present

## 2012-01-07 DIAGNOSIS — Z5181 Encounter for therapeutic drug level monitoring: Secondary | ICD-10-CM | POA: Diagnosis not present

## 2012-01-07 DIAGNOSIS — Z7901 Long term (current) use of anticoagulants: Secondary | ICD-10-CM | POA: Diagnosis not present

## 2012-01-07 MED ORDER — OXYCODONE-ACETAMINOPHEN 5-325 MG PO TABS: 1.0000 | ORAL_TABLET | ORAL | Status: AC | PRN

## 2012-01-07 MED ORDER — CIPROFLOXACIN HCL 500 MG PO TABS: 500.0000 mg | ORAL_TABLET | Freq: Two times a day (BID) | ORAL | Status: DC

## 2012-01-07 NOTE — Progress Notes (Signed)
Addended by: Christia Reading on: 01/07/2012 09:32 AM   Modules accepted: Orders

## 2012-01-07 NOTE — Progress Notes (Signed)
Subjective:     Patient ID: Charlotte [name], female   DOB: [birth year], 71 y.o.   MRN: [id number]  Wound Check  Unscheduled visit.  71 year old female had cannulated screw fixation, RIGHT hip abductor Keeling on April 17 developed compartment syndrome and fasciotomy on April 27 followed by 2nd looking wound closure on April 30 presented to Dr. Brooke Bonito office Friday with wound drainage x5 days. Culture was taken on May 31 and it showed staph. Aureus, sensitive to tetracycline, and ciprofloxacin with ciprofloxacin be in a better antibiotic so she was switched from the tetracycline, that she's been on since the 31st to Cipro. Wound debridement was performed and wet to dry dressing was placed. Noticeably sent to the house to perform wet to dry dressing changes daily and I will see her again on Monday. If she is not improving we will go to a wound VAC.   Review of Systems     Objective:   Physical Exam     Assessment:     Wound infection     Plan:     Wet to dry dressing and change antibiotics

## 2012-01-07 NOTE — Telephone Encounter (Signed)
[facility] supervisor Charlotte [name] called, following up on orders received regarding daily wound care.  Asking what specifically do we want them to do, what type of dressing?  Also, do you want them to pack it?  If so, with what?   Please call and advise at ph# [phone], Ext [phone].  Patient's next scheduled appointment is 01/12/12.

## 2012-01-07 NOTE — Patient Instructions (Signed)
Stop doxycycline and start ciprofloxicin

## 2012-01-08 ENCOUNTER — Other Ambulatory Visit (HOSPITAL_COMMUNITY): Payer: Medicare Other

## 2012-01-08 ENCOUNTER — Other Ambulatory Visit: Payer: Self-pay | Admitting: *Deleted

## 2012-01-08 DIAGNOSIS — G609 Hereditary and idiopathic neuropathy, unspecified: Secondary | ICD-10-CM | POA: Diagnosis not present

## 2012-01-08 DIAGNOSIS — T8189XA Other complications of procedures, not elsewhere classified, initial encounter: Secondary | ICD-10-CM

## 2012-01-08 DIAGNOSIS — I4891 Unspecified atrial fibrillation: Secondary | ICD-10-CM | POA: Diagnosis not present

## 2012-01-08 DIAGNOSIS — Z5181 Encounter for therapeutic drug level monitoring: Secondary | ICD-10-CM | POA: Diagnosis not present

## 2012-01-08 DIAGNOSIS — Z7901 Long term (current) use of anticoagulants: Secondary | ICD-10-CM | POA: Diagnosis not present

## 2012-01-08 DIAGNOSIS — S7290XD Unspecified fracture of unspecified femur, subsequent encounter for closed fracture with routine healing: Secondary | ICD-10-CM | POA: Diagnosis not present

## 2012-01-08 DIAGNOSIS — I10 Essential (primary) hypertension: Secondary | ICD-10-CM | POA: Diagnosis not present

## 2012-01-08 NOTE — Telephone Encounter (Signed)
Faxed new order

## 2012-01-09 ENCOUNTER — Other Ambulatory Visit (HOSPITAL_COMMUNITY): Payer: Self-pay | Admitting: Oncology

## 2012-01-09 ENCOUNTER — Encounter (HOSPITAL_COMMUNITY): Payer: Medicare Other | Attending: Oncology

## 2012-01-09 DIAGNOSIS — G609 Hereditary and idiopathic neuropathy, unspecified: Secondary | ICD-10-CM | POA: Diagnosis not present

## 2012-01-09 DIAGNOSIS — S7290XD Unspecified fracture of unspecified femur, subsequent encounter for closed fracture with routine healing: Secondary | ICD-10-CM | POA: Diagnosis not present

## 2012-01-09 DIAGNOSIS — I10 Essential (primary) hypertension: Secondary | ICD-10-CM | POA: Diagnosis not present

## 2012-01-09 DIAGNOSIS — D45 Polycythemia vera: Secondary | ICD-10-CM

## 2012-01-09 DIAGNOSIS — I4891 Unspecified atrial fibrillation: Secondary | ICD-10-CM | POA: Diagnosis not present

## 2012-01-09 DIAGNOSIS — Z5181 Encounter for therapeutic drug level monitoring: Secondary | ICD-10-CM | POA: Diagnosis not present

## 2012-01-09 DIAGNOSIS — Z7901 Long term (current) use of anticoagulants: Secondary | ICD-10-CM | POA: Diagnosis not present

## 2012-01-09 LAB — CBC
Hemoglobin: 15.3 g/dL — ABNORMAL HIGH (ref 12.0–15.0)
MCH: 36.8 pg — ABNORMAL HIGH (ref 26.0–34.0)
MCHC: 32.8 g/dL (ref 30.0–36.0)
MCV: 112 fL — ABNORMAL HIGH (ref 78.0–100.0)
Platelets: 556 10*3/uL — ABNORMAL HIGH (ref 150–400)
RBC: 4.16 MIL/uL (ref 3.87–5.11)

## 2012-01-09 NOTE — Progress Notes (Signed)
Charlotte [name] presented for labwork. Labs per MD order drawn via Peripheral Line 24 gauge needle inserted in lt ac  Good blood return present. Procedure without incident.  Needle removed intact. Patient tolerated procedure well.

## 2012-01-12 ENCOUNTER — Encounter: Payer: Self-pay | Admitting: Orthopedic Surgery

## 2012-01-12 ENCOUNTER — Ambulatory Visit (INDEPENDENT_AMBULATORY_CARE_PROVIDER_SITE_OTHER): Payer: Medicare Other | Admitting: Orthopedic Surgery

## 2012-01-12 VITALS — BP 98/60 | Ht 72.0 in | Wt 178.0 lb

## 2012-01-12 DIAGNOSIS — T8149XA Infection following a procedure, other surgical site, initial encounter: Secondary | ICD-10-CM

## 2012-01-12 DIAGNOSIS — T8140XA Infection following a procedure, unspecified, initial encounter: Secondary | ICD-10-CM

## 2012-01-12 NOTE — Patient Instructions (Signed)
Continue antibiotics and dress change

## 2012-01-12 NOTE — Progress Notes (Signed)
Patient ID: Charlotte [name], female   DOB: 08-13-1940, 71 y.o.   MRN: [id number] Chief Complaint  Patient presents with  . Follow-up    Recheck wound on right thigh.   Current Outpatient Prescriptions on File Prior to Visit  Medication Sig Dispense Refill  . aspirin EC 81 MG tablet Take 81 mg by mouth daily.      . ciprofloxacin (CIPRO) 500 MG tablet Take 1 tablet (500 mg total) by mouth 2 (two) times daily.  42 tablet  1  . digoxin (LANOXIN) 0.25 MG tablet Take 1 tablet (0.25 mg total) by mouth daily.  30 tablet  1  . diltiazem (CARDIZEM CD) 360 MG 24 hr capsule Take 180 mg by mouth daily.      . ferrous sulfate 325 (65 FE) MG tablet Take 1 tablet (325 mg total) by mouth 3 (three) times daily after meals.  60 tablet  0  . fish oil-omega-3 fatty acids 1000 MG capsule Take 1,200 mg by mouth daily.      . furosemide (LASIX) 40 MG tablet Take 1 tablet (40 mg total) by mouth 2 (two) times daily.  10 tablet  0  . gabapentin (NEURONTIN) 100 MG capsule Take 100 mg by mouth 3 (three) times daily.        Marland Kitchen levothyroxine (SYNTHROID, LEVOTHROID) 25 MCG tablet Take 25 mcg by mouth daily.        . metoprolol (TOPROL-XL) 100 MG 24 hr tablet Take 100 mg by mouth 2 (two) times daily.        Marland Kitchen oxyCODONE-acetaminophen (ROXICET) 5-325 MG per tablet Take 1 tablet by mouth every 4 (four) hours as needed for pain.  60 tablet  0  . potassium chloride SA (K-DUR,KLOR-CON) 20 MEQ tablet Take 2 tablets (40 mEq total) by mouth 2 (two) times daily.  30 tablet  0  . warfarin (COUMADIN) 5 MG tablet Take 5 mg by mouth daily.      Marland Kitchen doxycycline (DORYX) 100 MG DR capsule Take 100 mg by mouth 2 (two) times daily.      . metoCLOPramide (REGLAN) 10 MG tablet Take 1 tablet (10 mg total) by mouth every 6 (six) hours as needed (nausea).  30 tablet  0  . metoCLOPramide (REGLAN) 10 MG tablet Take 1 tablet (10 mg total) by mouth every 6 (six) hours as needed (nausea).  30 tablet  0  . oxycodone (OXY-IR) 5 MG capsule Take 5 mg by mouth  every 6 (six) hours as needed.      . warfarin (COUMADIN) 7.5 MG tablet Take 1 tablet (7.5 mg total) by mouth every Monday, Wednesday, and Friday at 6 PM.  30 tablet  1    Wound has improved with dressing changes.  Continue current antibiotics and dressing change daily.

## 2012-01-13 DIAGNOSIS — S7290XD Unspecified fracture of unspecified femur, subsequent encounter for closed fracture with routine healing: Secondary | ICD-10-CM | POA: Diagnosis not present

## 2012-01-13 DIAGNOSIS — G609 Hereditary and idiopathic neuropathy, unspecified: Secondary | ICD-10-CM | POA: Diagnosis not present

## 2012-01-13 DIAGNOSIS — I4891 Unspecified atrial fibrillation: Secondary | ICD-10-CM | POA: Diagnosis not present

## 2012-01-13 DIAGNOSIS — Z7901 Long term (current) use of anticoagulants: Secondary | ICD-10-CM | POA: Diagnosis not present

## 2012-01-13 DIAGNOSIS — Z5181 Encounter for therapeutic drug level monitoring: Secondary | ICD-10-CM | POA: Diagnosis not present

## 2012-01-13 DIAGNOSIS — I10 Essential (primary) hypertension: Secondary | ICD-10-CM | POA: Diagnosis not present

## 2012-01-15 DIAGNOSIS — S7290XD Unspecified fracture of unspecified femur, subsequent encounter for closed fracture with routine healing: Secondary | ICD-10-CM | POA: Diagnosis not present

## 2012-01-15 DIAGNOSIS — I4891 Unspecified atrial fibrillation: Secondary | ICD-10-CM | POA: Diagnosis not present

## 2012-01-15 DIAGNOSIS — I10 Essential (primary) hypertension: Secondary | ICD-10-CM | POA: Diagnosis not present

## 2012-01-15 DIAGNOSIS — Z5181 Encounter for therapeutic drug level monitoring: Secondary | ICD-10-CM | POA: Diagnosis not present

## 2012-01-15 DIAGNOSIS — G609 Hereditary and idiopathic neuropathy, unspecified: Secondary | ICD-10-CM | POA: Diagnosis not present

## 2012-01-15 DIAGNOSIS — Z7901 Long term (current) use of anticoagulants: Secondary | ICD-10-CM | POA: Diagnosis not present

## 2012-01-19 ENCOUNTER — Ambulatory Visit (INDEPENDENT_AMBULATORY_CARE_PROVIDER_SITE_OTHER): Payer: Medicare Other | Admitting: Orthopedic Surgery

## 2012-01-19 ENCOUNTER — Encounter: Payer: Self-pay | Admitting: Orthopedic Surgery

## 2012-01-19 VITALS — BP 100/78 | Ht 72.0 in | Wt 178.0 lb

## 2012-01-19 DIAGNOSIS — G609 Hereditary and idiopathic neuropathy, unspecified: Secondary | ICD-10-CM | POA: Diagnosis not present

## 2012-01-19 DIAGNOSIS — T8149XA Infection following a procedure, other surgical site, initial encounter: Secondary | ICD-10-CM

## 2012-01-19 DIAGNOSIS — Z5181 Encounter for therapeutic drug level monitoring: Secondary | ICD-10-CM | POA: Diagnosis not present

## 2012-01-19 DIAGNOSIS — I4891 Unspecified atrial fibrillation: Secondary | ICD-10-CM | POA: Diagnosis not present

## 2012-01-19 DIAGNOSIS — S7290XD Unspecified fracture of unspecified femur, subsequent encounter for closed fracture with routine healing: Secondary | ICD-10-CM | POA: Diagnosis not present

## 2012-01-19 DIAGNOSIS — I10 Essential (primary) hypertension: Secondary | ICD-10-CM | POA: Diagnosis not present

## 2012-01-19 DIAGNOSIS — T8140XA Infection following a procedure, unspecified, initial encounter: Secondary | ICD-10-CM

## 2012-01-19 DIAGNOSIS — Z7901 Long term (current) use of anticoagulants: Secondary | ICD-10-CM | POA: Diagnosis not present

## 2012-01-19 NOTE — Progress Notes (Signed)
Patient ID: Charlotte [name], female   DOB: [birth year], 71 y.o.   MRN: [id number] Chief Complaint  Patient presents with  . Wound Check    1 week recheck wound, DOS 11/19/11    BP 100/78  Ht 6' (1.829 m)  Wt 178 lb (80.74 kg)  BMI 24.14 kg/m2  Current Outpatient Prescriptions on File Prior to Visit  Medication Sig Dispense Refill  . aspirin EC 81 MG tablet Take 81 mg by mouth daily.      . digoxin (LANOXIN) 0.25 MG tablet Take 1 tablet (0.25 mg total) by mouth daily.  30 tablet  1  . diltiazem (CARDIZEM CD) 360 MG 24 hr capsule Take 180 mg by mouth daily.      Marland Kitchen doxycycline (DORYX) 100 MG DR capsule Take 100 mg by mouth 2 (two) times daily.      . ferrous sulfate 325 (65 FE) MG tablet Take 1 tablet (325 mg total) by mouth 3 (three) times daily after meals.  60 tablet  0  . fish oil-omega-3 fatty acids 1000 MG capsule Take 1,200 mg by mouth daily.      . furosemide (LASIX) 40 MG tablet Take 1 tablet (40 mg total) by mouth 2 (two) times daily.  10 tablet  0  . gabapentin (NEURONTIN) 100 MG capsule Take 100 mg by mouth 3 (three) times daily.        Marland Kitchen levothyroxine (SYNTHROID, LEVOTHROID) 25 MCG tablet Take 25 mcg by mouth daily.        . metoprolol (TOPROL-XL) 100 MG 24 hr tablet Take 100 mg by mouth 2 (two) times daily.        Marland Kitchen oxycodone (OXY-IR) 5 MG capsule Take 5 mg by mouth every 6 (six) hours as needed.      . potassium chloride SA (K-DUR,KLOR-CON) 20 MEQ tablet Take 2 tablets (40 mEq total) by mouth 2 (two) times daily.  30 tablet  0  . warfarin (COUMADIN) 5 MG tablet Take 5 mg by mouth daily.      Marland Kitchen warfarin (COUMADIN) 7.5 MG tablet Take 1 tablet (7.5 mg total) by mouth every Monday, Wednesday, and Friday at 6 PM.  30 tablet  1

## 2012-01-20 DIAGNOSIS — I10 Essential (primary) hypertension: Secondary | ICD-10-CM | POA: Diagnosis not present

## 2012-01-20 DIAGNOSIS — I4891 Unspecified atrial fibrillation: Secondary | ICD-10-CM | POA: Diagnosis not present

## 2012-01-20 DIAGNOSIS — Z7901 Long term (current) use of anticoagulants: Secondary | ICD-10-CM | POA: Diagnosis not present

## 2012-01-20 DIAGNOSIS — S7290XD Unspecified fracture of unspecified femur, subsequent encounter for closed fracture with routine healing: Secondary | ICD-10-CM | POA: Diagnosis not present

## 2012-01-20 DIAGNOSIS — Z5181 Encounter for therapeutic drug level monitoring: Secondary | ICD-10-CM | POA: Diagnosis not present

## 2012-01-20 DIAGNOSIS — G609 Hereditary and idiopathic neuropathy, unspecified: Secondary | ICD-10-CM | POA: Diagnosis not present

## 2012-01-21 DIAGNOSIS — Z4789 Encounter for other orthopedic aftercare: Secondary | ICD-10-CM | POA: Diagnosis not present

## 2012-01-21 DIAGNOSIS — I509 Heart failure, unspecified: Secondary | ICD-10-CM | POA: Diagnosis not present

## 2012-01-21 DIAGNOSIS — S7290XD Unspecified fracture of unspecified femur, subsequent encounter for closed fracture with routine healing: Secondary | ICD-10-CM | POA: Diagnosis not present

## 2012-01-21 DIAGNOSIS — G609 Hereditary and idiopathic neuropathy, unspecified: Secondary | ICD-10-CM | POA: Diagnosis not present

## 2012-01-21 DIAGNOSIS — I5033 Acute on chronic diastolic (congestive) heart failure: Secondary | ICD-10-CM | POA: Diagnosis not present

## 2012-01-21 DIAGNOSIS — I4891 Unspecified atrial fibrillation: Secondary | ICD-10-CM | POA: Diagnosis not present

## 2012-01-23 DIAGNOSIS — I5033 Acute on chronic diastolic (congestive) heart failure: Secondary | ICD-10-CM | POA: Diagnosis not present

## 2012-01-23 DIAGNOSIS — I509 Heart failure, unspecified: Secondary | ICD-10-CM | POA: Diagnosis not present

## 2012-01-23 DIAGNOSIS — G609 Hereditary and idiopathic neuropathy, unspecified: Secondary | ICD-10-CM | POA: Diagnosis not present

## 2012-01-23 DIAGNOSIS — Z4789 Encounter for other orthopedic aftercare: Secondary | ICD-10-CM | POA: Diagnosis not present

## 2012-01-23 DIAGNOSIS — I4891 Unspecified atrial fibrillation: Secondary | ICD-10-CM | POA: Diagnosis not present

## 2012-01-23 DIAGNOSIS — S7290XD Unspecified fracture of unspecified femur, subsequent encounter for closed fracture with routine healing: Secondary | ICD-10-CM | POA: Diagnosis not present

## 2012-01-27 DIAGNOSIS — G609 Hereditary and idiopathic neuropathy, unspecified: Secondary | ICD-10-CM | POA: Diagnosis not present

## 2012-01-27 DIAGNOSIS — I4891 Unspecified atrial fibrillation: Secondary | ICD-10-CM | POA: Diagnosis not present

## 2012-01-27 DIAGNOSIS — Z4789 Encounter for other orthopedic aftercare: Secondary | ICD-10-CM | POA: Diagnosis not present

## 2012-01-27 DIAGNOSIS — S7290XD Unspecified fracture of unspecified femur, subsequent encounter for closed fracture with routine healing: Secondary | ICD-10-CM | POA: Diagnosis not present

## 2012-01-27 DIAGNOSIS — I509 Heart failure, unspecified: Secondary | ICD-10-CM | POA: Diagnosis not present

## 2012-01-27 DIAGNOSIS — I5033 Acute on chronic diastolic (congestive) heart failure: Secondary | ICD-10-CM | POA: Diagnosis not present

## 2012-01-29 ENCOUNTER — Encounter (HOSPITAL_COMMUNITY): Payer: Medicare Other

## 2012-01-29 ENCOUNTER — Other Ambulatory Visit (HOSPITAL_COMMUNITY): Payer: Medicare Other

## 2012-01-30 ENCOUNTER — Encounter (HOSPITAL_BASED_OUTPATIENT_CLINIC_OR_DEPARTMENT_OTHER): Payer: Medicare Other

## 2012-01-30 DIAGNOSIS — D45 Polycythemia vera: Secondary | ICD-10-CM

## 2012-01-30 DIAGNOSIS — G609 Hereditary and idiopathic neuropathy, unspecified: Secondary | ICD-10-CM | POA: Diagnosis not present

## 2012-01-30 DIAGNOSIS — I509 Heart failure, unspecified: Secondary | ICD-10-CM | POA: Diagnosis not present

## 2012-01-30 DIAGNOSIS — Z4789 Encounter for other orthopedic aftercare: Secondary | ICD-10-CM | POA: Diagnosis not present

## 2012-01-30 DIAGNOSIS — I4891 Unspecified atrial fibrillation: Secondary | ICD-10-CM | POA: Diagnosis not present

## 2012-01-30 DIAGNOSIS — S7290XD Unspecified fracture of unspecified femur, subsequent encounter for closed fracture with routine healing: Secondary | ICD-10-CM | POA: Diagnosis not present

## 2012-01-30 DIAGNOSIS — I5033 Acute on chronic diastolic (congestive) heart failure: Secondary | ICD-10-CM | POA: Diagnosis not present

## 2012-01-30 LAB — CBC
HCT: 45.5 % (ref 36.0–46.0)
MCHC: 32.7 g/dL (ref 30.0–36.0)
MCV: 109.6 fL — ABNORMAL HIGH (ref 78.0–100.0)
Platelets: 375 10*3/uL (ref 150–400)
RDW: 17.8 % — ABNORMAL HIGH (ref 11.5–15.5)

## 2012-01-30 LAB — DIFFERENTIAL
Basophils Absolute: 0.1 10*3/uL (ref 0.0–0.1)
Lymphocytes Relative: 19 % (ref 12–46)
Monocytes Absolute: 0.2 10*3/uL (ref 0.1–1.0)
Monocytes Relative: 3 % (ref 3–12)
Neutro Abs: 5.1 10*3/uL (ref 1.7–7.7)

## 2012-01-30 NOTE — Progress Notes (Signed)
Charlotte [name] presented for labwork. Labs per MD order drawn via Peripheral Line 23 gauge needle inserted in right AC  Good blood return present. Procedure without incident.  Needle removed intact. Patient tolerated procedure well.

## 2012-02-02 ENCOUNTER — Ambulatory Visit (INDEPENDENT_AMBULATORY_CARE_PROVIDER_SITE_OTHER): Payer: Medicare Other | Admitting: Orthopedic Surgery

## 2012-02-02 ENCOUNTER — Encounter: Payer: Self-pay | Admitting: Orthopedic Surgery

## 2012-02-02 VITALS — BP 102/70 | Ht 72.0 in | Wt 178.0 lb

## 2012-02-02 DIAGNOSIS — T8149XA Infection following a procedure, other surgical site, initial encounter: Secondary | ICD-10-CM

## 2012-02-02 DIAGNOSIS — M79A29 Nontraumatic compartment syndrome of unspecified lower extremity: Secondary | ICD-10-CM

## 2012-02-02 DIAGNOSIS — T8140XA Infection following a procedure, unspecified, initial encounter: Secondary | ICD-10-CM

## 2012-02-02 NOTE — Patient Instructions (Signed)
Continue antibiotics and dressings

## 2012-02-02 NOTE — Progress Notes (Signed)
Patient ID: Charlotte [name], female   DOB: [birth year], 71 y.o.   MRN: [id number] Chief Complaint  Patient presents with  . Follow-up    2 week recheck wound Rt thigh, DOS 11/19/11    BP 102/70  Ht 6' (1.829 m)  Wt 178 lb (80.74 kg)  BMI 24.14 kg/m2  Postoperative wound breakdown after fasciotomy of the RIGHT thigh. She's currently on oral antibiotics. Her wound measures 15 x 11 mm and is a good granulation bed.  She will come back in 4 weeks. Continue dressing changes

## 2012-02-03 DIAGNOSIS — S7290XD Unspecified fracture of unspecified femur, subsequent encounter for closed fracture with routine healing: Secondary | ICD-10-CM | POA: Diagnosis not present

## 2012-02-03 DIAGNOSIS — Z4789 Encounter for other orthopedic aftercare: Secondary | ICD-10-CM | POA: Diagnosis not present

## 2012-02-03 DIAGNOSIS — I4891 Unspecified atrial fibrillation: Secondary | ICD-10-CM | POA: Diagnosis not present

## 2012-02-03 DIAGNOSIS — I509 Heart failure, unspecified: Secondary | ICD-10-CM | POA: Diagnosis not present

## 2012-02-03 DIAGNOSIS — G609 Hereditary and idiopathic neuropathy, unspecified: Secondary | ICD-10-CM | POA: Diagnosis not present

## 2012-02-03 DIAGNOSIS — I5033 Acute on chronic diastolic (congestive) heart failure: Secondary | ICD-10-CM | POA: Diagnosis not present

## 2012-02-04 DIAGNOSIS — I5033 Acute on chronic diastolic (congestive) heart failure: Secondary | ICD-10-CM | POA: Diagnosis not present

## 2012-02-04 DIAGNOSIS — Z4789 Encounter for other orthopedic aftercare: Secondary | ICD-10-CM | POA: Diagnosis not present

## 2012-02-04 DIAGNOSIS — Z7901 Long term (current) use of anticoagulants: Secondary | ICD-10-CM | POA: Diagnosis not present

## 2012-02-04 DIAGNOSIS — S7290XD Unspecified fracture of unspecified femur, subsequent encounter for closed fracture with routine healing: Secondary | ICD-10-CM | POA: Diagnosis not present

## 2012-02-04 DIAGNOSIS — I509 Heart failure, unspecified: Secondary | ICD-10-CM | POA: Diagnosis not present

## 2012-02-04 DIAGNOSIS — G609 Hereditary and idiopathic neuropathy, unspecified: Secondary | ICD-10-CM | POA: Diagnosis not present

## 2012-02-04 DIAGNOSIS — I4891 Unspecified atrial fibrillation: Secondary | ICD-10-CM | POA: Diagnosis not present

## 2012-02-05 DIAGNOSIS — Z4789 Encounter for other orthopedic aftercare: Secondary | ICD-10-CM | POA: Diagnosis not present

## 2012-02-05 DIAGNOSIS — S7290XD Unspecified fracture of unspecified femur, subsequent encounter for closed fracture with routine healing: Secondary | ICD-10-CM | POA: Diagnosis not present

## 2012-02-05 DIAGNOSIS — I5033 Acute on chronic diastolic (congestive) heart failure: Secondary | ICD-10-CM | POA: Diagnosis not present

## 2012-02-05 DIAGNOSIS — I4891 Unspecified atrial fibrillation: Secondary | ICD-10-CM | POA: Diagnosis not present

## 2012-02-05 DIAGNOSIS — I509 Heart failure, unspecified: Secondary | ICD-10-CM | POA: Diagnosis not present

## 2012-02-05 DIAGNOSIS — G609 Hereditary and idiopathic neuropathy, unspecified: Secondary | ICD-10-CM | POA: Diagnosis not present

## 2012-02-06 ENCOUNTER — Other Ambulatory Visit (HOSPITAL_COMMUNITY): Payer: Medicare Other

## 2012-02-09 DIAGNOSIS — I5033 Acute on chronic diastolic (congestive) heart failure: Secondary | ICD-10-CM | POA: Diagnosis not present

## 2012-02-09 DIAGNOSIS — G609 Hereditary and idiopathic neuropathy, unspecified: Secondary | ICD-10-CM | POA: Diagnosis not present

## 2012-02-09 DIAGNOSIS — Z4789 Encounter for other orthopedic aftercare: Secondary | ICD-10-CM | POA: Diagnosis not present

## 2012-02-09 DIAGNOSIS — I509 Heart failure, unspecified: Secondary | ICD-10-CM | POA: Diagnosis not present

## 2012-02-09 DIAGNOSIS — I4891 Unspecified atrial fibrillation: Secondary | ICD-10-CM | POA: Diagnosis not present

## 2012-02-09 DIAGNOSIS — S7290XD Unspecified fracture of unspecified femur, subsequent encounter for closed fracture with routine healing: Secondary | ICD-10-CM | POA: Diagnosis not present

## 2012-02-11 ENCOUNTER — Telehealth: Payer: Self-pay | Admitting: Orthopedic Surgery

## 2012-02-11 NOTE — Telephone Encounter (Signed)
Ms. [name] , RN with [facility] left a message that Charlotte [name] will be discharged from wound care as of Monday, 02/16/12. Says she is independent with her wound care.

## 2012-02-16 DIAGNOSIS — Z4789 Encounter for other orthopedic aftercare: Secondary | ICD-10-CM | POA: Diagnosis not present

## 2012-02-16 DIAGNOSIS — I4891 Unspecified atrial fibrillation: Secondary | ICD-10-CM | POA: Diagnosis not present

## 2012-02-16 DIAGNOSIS — I5033 Acute on chronic diastolic (congestive) heart failure: Secondary | ICD-10-CM | POA: Diagnosis not present

## 2012-02-16 DIAGNOSIS — I509 Heart failure, unspecified: Secondary | ICD-10-CM | POA: Diagnosis not present

## 2012-02-16 DIAGNOSIS — S7290XD Unspecified fracture of unspecified femur, subsequent encounter for closed fracture with routine healing: Secondary | ICD-10-CM | POA: Diagnosis not present

## 2012-02-16 DIAGNOSIS — G609 Hereditary and idiopathic neuropathy, unspecified: Secondary | ICD-10-CM | POA: Diagnosis not present

## 2012-02-17 DIAGNOSIS — B351 Tinea unguium: Secondary | ICD-10-CM | POA: Diagnosis not present

## 2012-02-17 DIAGNOSIS — M79609 Pain in unspecified limb: Secondary | ICD-10-CM | POA: Diagnosis not present

## 2012-02-18 ENCOUNTER — Other Ambulatory Visit: Payer: Self-pay | Admitting: Orthopedic Surgery

## 2012-02-20 DIAGNOSIS — IMO0002 Reserved for concepts with insufficient information to code with codable children: Secondary | ICD-10-CM | POA: Diagnosis not present

## 2012-02-20 DIAGNOSIS — I4892 Unspecified atrial flutter: Secondary | ICD-10-CM | POA: Diagnosis not present

## 2012-02-20 DIAGNOSIS — I10 Essential (primary) hypertension: Secondary | ICD-10-CM | POA: Diagnosis not present

## 2012-02-26 ENCOUNTER — Other Ambulatory Visit (HOSPITAL_COMMUNITY): Payer: Medicare Other

## 2012-02-26 ENCOUNTER — Encounter (HOSPITAL_COMMUNITY): Payer: Medicare Other

## 2012-02-27 ENCOUNTER — Encounter (HOSPITAL_COMMUNITY): Payer: Medicare Other | Attending: Oncology

## 2012-02-27 DIAGNOSIS — D45 Polycythemia vera: Secondary | ICD-10-CM | POA: Diagnosis not present

## 2012-02-27 LAB — DIFFERENTIAL
Basophils Relative: 1 % (ref 0–1)
Lymphs Abs: 1.2 10*3/uL (ref 0.7–4.0)
Monocytes Absolute: 0.2 10*3/uL (ref 0.1–1.0)
Monocytes Relative: 4 % (ref 3–12)
Neutro Abs: 4.2 10*3/uL (ref 1.7–7.7)

## 2012-02-27 LAB — CBC
HCT: 42.9 % (ref 36.0–46.0)
Hemoglobin: 14.4 g/dL (ref 12.0–15.0)
MCHC: 33.6 g/dL (ref 30.0–36.0)

## 2012-02-27 NOTE — Progress Notes (Signed)
Labs drawn today for cbc/diff 

## 2012-03-08 ENCOUNTER — Encounter: Payer: Self-pay | Admitting: Orthopedic Surgery

## 2012-03-08 ENCOUNTER — Ambulatory Visit (INDEPENDENT_AMBULATORY_CARE_PROVIDER_SITE_OTHER): Payer: Medicare Other | Admitting: Orthopedic Surgery

## 2012-03-08 VITALS — BP 130/90 | Ht 72.0 in | Wt 179.2 lb

## 2012-03-08 DIAGNOSIS — G629 Polyneuropathy, unspecified: Secondary | ICD-10-CM

## 2012-03-08 DIAGNOSIS — G609 Hereditary and idiopathic neuropathy, unspecified: Secondary | ICD-10-CM

## 2012-03-08 DIAGNOSIS — M79A29 Nontraumatic compartment syndrome of unspecified lower extremity: Secondary | ICD-10-CM

## 2012-03-08 MED ORDER — GABAPENTIN 100 MG PO CAPS: 100.0000 mg | ORAL_CAPSULE | Freq: Three times a day (TID) | ORAL | Status: DC

## 2012-03-08 NOTE — Patient Instructions (Signed)
Finish antibiotics

## 2012-03-08 NOTE — Progress Notes (Signed)
Patient ID: Charlotte [name], female   DOB: 1940-11-19, 71 y.o.   MRN: [id number] Chief Complaint  Patient presents with  . Follow-up    recheck wound right thigh, DOS 11/19/11     Postoperative wound breakdown after fasciotomy of the RIGHT thigh. She's currently on oral antibiotics.   The wound has closed. The compartments are soft.  She is bothered by peripheral neuropathy 100 mg of Neurontin 3 times a day.  I increased to 300 mg at night and 100 mg twice a day.  She should finish her antibiotics and follow up as needed

## 2012-03-09 ENCOUNTER — Other Ambulatory Visit (HOSPITAL_COMMUNITY): Payer: Medicare Other

## 2012-03-18 DIAGNOSIS — S72143A Displaced intertrochanteric fracture of unspecified femur, initial encounter for closed fracture: Secondary | ICD-10-CM | POA: Diagnosis not present

## 2012-03-19 ENCOUNTER — Other Ambulatory Visit (HOSPITAL_COMMUNITY): Payer: Self-pay | Admitting: Oncology

## 2012-03-25 ENCOUNTER — Encounter (HOSPITAL_COMMUNITY): Payer: Medicare Other

## 2012-03-25 DIAGNOSIS — I4891 Unspecified atrial fibrillation: Secondary | ICD-10-CM | POA: Diagnosis not present

## 2012-03-25 DIAGNOSIS — I83893 Varicose veins of bilateral lower extremities with other complications: Secondary | ICD-10-CM | POA: Diagnosis not present

## 2012-03-25 DIAGNOSIS — I10 Essential (primary) hypertension: Secondary | ICD-10-CM | POA: Diagnosis not present

## 2012-03-26 ENCOUNTER — Encounter (HOSPITAL_COMMUNITY): Payer: Medicare Other | Attending: Oncology

## 2012-03-26 DIAGNOSIS — D45 Polycythemia vera: Secondary | ICD-10-CM

## 2012-03-26 LAB — CBC
HCT: 42.9 % (ref 36.0–46.0)
MCH: 37.2 pg — ABNORMAL HIGH (ref 26.0–34.0)
MCHC: 34 g/dL (ref 30.0–36.0)
MCV: 109.4 fL — ABNORMAL HIGH (ref 78.0–100.0)
RDW: 19.9 % — ABNORMAL HIGH (ref 11.5–15.5)

## 2012-03-26 LAB — DIFFERENTIAL
Basophils Absolute: 0.1 10*3/uL (ref 0.0–0.1)
Eosinophils Relative: 1 % (ref 0–5)
Lymphocytes Relative: 24 % (ref 12–46)
Lymphs Abs: 1.5 10*3/uL (ref 0.7–4.0)
Monocytes Absolute: 0.2 10*3/uL (ref 0.1–1.0)
Monocytes Relative: 3 % (ref 3–12)

## 2012-03-26 NOTE — Progress Notes (Signed)
Labs drawn today for cbc/diff 

## 2012-03-29 ENCOUNTER — Ambulatory Visit (HOSPITAL_COMMUNITY): Payer: Medicare Other | Admitting: Oncology

## 2012-04-01 ENCOUNTER — Encounter (HOSPITAL_COMMUNITY): Payer: Medicare Other

## 2012-04-16 DIAGNOSIS — I4892 Unspecified atrial flutter: Secondary | ICD-10-CM | POA: Diagnosis not present

## 2012-04-23 ENCOUNTER — Encounter (HOSPITAL_COMMUNITY): Payer: Medicare Other | Attending: Oncology

## 2012-04-23 DIAGNOSIS — D45 Polycythemia vera: Secondary | ICD-10-CM | POA: Diagnosis not present

## 2012-04-23 DIAGNOSIS — S72143A Displaced intertrochanteric fracture of unspecified femur, initial encounter for closed fracture: Secondary | ICD-10-CM | POA: Diagnosis not present

## 2012-04-23 LAB — DIFFERENTIAL
Basophils Absolute: 0.1 10*3/uL (ref 0.0–0.1)
Eosinophils Relative: 1 % (ref 0–5)
Lymphocytes Relative: 24 % (ref 12–46)

## 2012-04-23 LAB — CBC
MCV: 114.7 fL — ABNORMAL HIGH (ref 78.0–100.0)
Platelets: 331 10*3/uL (ref 150–400)
RDW: 19.4 % — ABNORMAL HIGH (ref 11.5–15.5)
WBC: 4.5 10*3/uL (ref 4.0–10.5)

## 2012-04-23 NOTE — Progress Notes (Signed)
Labs drawn today for cbc/diff 

## 2012-04-29 ENCOUNTER — Other Ambulatory Visit (HOSPITAL_COMMUNITY): Payer: Medicare Other

## 2012-05-04 DIAGNOSIS — M79609 Pain in unspecified limb: Secondary | ICD-10-CM | POA: Diagnosis not present

## 2012-05-04 DIAGNOSIS — B351 Tinea unguium: Secondary | ICD-10-CM | POA: Diagnosis not present

## 2012-05-07 DIAGNOSIS — I4892 Unspecified atrial flutter: Secondary | ICD-10-CM | POA: Diagnosis not present

## 2012-05-11 DIAGNOSIS — Z23 Encounter for immunization: Secondary | ICD-10-CM | POA: Diagnosis not present

## 2012-05-21 ENCOUNTER — Encounter (HOSPITAL_COMMUNITY): Payer: Medicare Other | Attending: Oncology

## 2012-05-21 DIAGNOSIS — D45 Polycythemia vera: Secondary | ICD-10-CM | POA: Diagnosis not present

## 2012-05-21 LAB — CBC
Hemoglobin: 14.5 g/dL (ref 12.0–15.0)
MCHC: 34.4 g/dL (ref 30.0–36.0)
RDW: 17.5 % — ABNORMAL HIGH (ref 11.5–15.5)

## 2012-05-21 LAB — DIFFERENTIAL
Basophils Absolute: 0.1 10*3/uL (ref 0.0–0.1)
Basophils Relative: 1 % (ref 0–1)
Monocytes Relative: 3 % (ref 3–12)
Neutro Abs: 5.2 10*3/uL (ref 1.7–7.7)
Neutrophils Relative %: 78 % — ABNORMAL HIGH (ref 43–77)

## 2012-05-21 NOTE — Progress Notes (Signed)
Labs drawn today for cbc/diff 

## 2012-05-25 DIAGNOSIS — B351 Tinea unguium: Secondary | ICD-10-CM | POA: Diagnosis not present

## 2012-05-25 DIAGNOSIS — R609 Edema, unspecified: Secondary | ICD-10-CM | POA: Diagnosis not present

## 2012-05-25 DIAGNOSIS — L259 Unspecified contact dermatitis, unspecified cause: Secondary | ICD-10-CM | POA: Diagnosis not present

## 2012-05-27 ENCOUNTER — Other Ambulatory Visit (HOSPITAL_COMMUNITY): Payer: Medicare Other

## 2012-06-04 DIAGNOSIS — I4892 Unspecified atrial flutter: Secondary | ICD-10-CM | POA: Diagnosis not present

## 2012-06-18 ENCOUNTER — Encounter (HOSPITAL_COMMUNITY): Payer: Medicare Other | Attending: Oncology

## 2012-06-18 DIAGNOSIS — D45 Polycythemia vera: Secondary | ICD-10-CM | POA: Insufficient documentation

## 2012-06-18 LAB — DIFFERENTIAL
Lymphocytes Relative: 31 % (ref 12–46)
Lymphs Abs: 1.1 10*3/uL (ref 0.7–4.0)
Monocytes Relative: 5 % (ref 3–12)
Neutro Abs: 2.3 10*3/uL (ref 1.7–7.7)
Neutrophils Relative %: 63 % (ref 43–77)

## 2012-06-18 LAB — CBC
Hemoglobin: 14 g/dL (ref 12.0–15.0)
RBC: 3.25 MIL/uL — ABNORMAL LOW (ref 3.87–5.11)
WBC: 3.6 10*3/uL — ABNORMAL LOW (ref 4.0–10.5)

## 2012-06-18 NOTE — Progress Notes (Signed)
Labs drawn today for cbc/diff 

## 2012-06-24 ENCOUNTER — Other Ambulatory Visit (HOSPITAL_COMMUNITY): Payer: Medicare Other

## 2012-06-25 DIAGNOSIS — I4892 Unspecified atrial flutter: Secondary | ICD-10-CM | POA: Diagnosis not present

## 2012-07-02 ENCOUNTER — Ambulatory Visit (HOSPITAL_COMMUNITY): Payer: Medicare Other | Admitting: Oncology

## 2012-07-06 ENCOUNTER — Ambulatory Visit (HOSPITAL_COMMUNITY): Payer: Medicare Other | Admitting: Oncology

## 2012-07-14 ENCOUNTER — Other Ambulatory Visit (HOSPITAL_COMMUNITY): Payer: Self-pay | Admitting: Oncology

## 2012-07-16 ENCOUNTER — Ambulatory Visit (HOSPITAL_COMMUNITY)
Admission: RE | Admit: 2012-07-16 | Discharge: 2012-07-16 | Disposition: A | Discharge status: Home / Self Care | Payer: Medicare Other | Source: Ambulatory Visit | Attending: Oncology | Admitting: Oncology

## 2012-07-16 ENCOUNTER — Encounter (HOSPITAL_COMMUNITY): Payer: Medicare Other | Attending: Oncology | Admitting: Oncology

## 2012-07-16 ENCOUNTER — Other Ambulatory Visit (HOSPITAL_COMMUNITY): Payer: Self-pay | Admitting: Oncology

## 2012-07-16 ENCOUNTER — Encounter (HOSPITAL_COMMUNITY): Payer: Self-pay | Admitting: Oncology

## 2012-07-16 VITALS — BP 128/82 | HR 72 | Temp 97.3°F | Resp 16 | Wt 183.7 lb

## 2012-07-16 DIAGNOSIS — M79606 Pain in leg, unspecified: Secondary | ICD-10-CM

## 2012-07-16 DIAGNOSIS — M79609 Pain in unspecified limb: Secondary | ICD-10-CM | POA: Insufficient documentation

## 2012-07-16 DIAGNOSIS — D45 Polycythemia vera: Secondary | ICD-10-CM | POA: Diagnosis not present

## 2012-07-16 DIAGNOSIS — I82409 Acute embolism and thrombosis of unspecified deep veins of unspecified lower extremity: Secondary | ICD-10-CM | POA: Insufficient documentation

## 2012-07-16 DIAGNOSIS — I824Y9 Acute embolism and thrombosis of unspecified deep veins of unspecified proximal lower extremity: Secondary | ICD-10-CM | POA: Diagnosis not present

## 2012-07-16 DIAGNOSIS — R0989 Other specified symptoms and signs involving the circulatory and respiratory systems: Secondary | ICD-10-CM | POA: Diagnosis not present

## 2012-07-16 LAB — CBC
HCT: 42.5 % (ref 36.0–46.0)
Hemoglobin: 14.8 g/dL (ref 12.0–15.0)
MCV: 123.2 fL — ABNORMAL HIGH (ref 78.0–100.0)
RDW: 14.8 % (ref 11.5–15.5)
WBC: 6.4 10*3/uL (ref 4.0–10.5)

## 2012-07-16 LAB — DIFFERENTIAL
Basophils Absolute: 0.1 10*3/uL (ref 0.0–0.1)
Eosinophils Relative: 1 % (ref 0–5)
Lymphocytes Relative: 24 % (ref 12–46)
Monocytes Absolute: 0.1 10*3/uL (ref 0.1–1.0)
Monocytes Relative: 2 % — ABNORMAL LOW (ref 3–12)

## 2012-07-16 NOTE — Patient Instructions (Addendum)
[facility] Discharge Instructions  RECOMMENDATIONS MADE BY THE CONSULTANT AND ANY TEST RESULTS WILL BE SENT TO YOUR REFERRING PHYSICIAN.  EXAM FINDINGS BY THE PHYSICIAN TODAY AND SIGNS OR SYMPTOMS TO REPORT TO CLINIC OR PRIMARY PHYSICIAN:  Exam and discussion by MD.  [place] Scrape that the problem with your feet is arterial in nature.  Need to do some xray studies to see if we can find out what's going on.  Will also check your blood work today and if we need to make any changes in your hydrea we will call you.  MEDICATIONS PRESCRIBED:  none    SPECIAL INSTRUCTIONS/FOLLOW-UP: Lab work today and to see PA in 1 month.  Thank you for choosing [facility] to provide your oncology and hematology care.  To afford each patient quality time with our providers, please arrive at least 15 minutes before your scheduled appointment time.  With your help, our goal is to use those 15 minutes to complete the necessary work-up to ensure our physicians have the information they need to help with your evaluation and healthcare recommendations.    Effective January 1st, 2014, we ask that you re-schedule your appointment with our physicians should you arrive 10 or more minutes late for your appointment.  We strive to give you quality time with our providers, and arriving late affects you and other patients whose appointments are after yours.    Again, thank you for choosing [facility].  Our hope is that these requests will decrease the amount of time that you wait before being seen by our physicians.       _____________________________________________________________  I acknowledge that I have been informed and understand all the instructions given to me and received a copy. I do not have anymore questions at this time but understand that I may call the [place] at [facility] at [phone] during business hours should I have any further questions or need  assistance in obtaining follow-up care.    __________________________________________  _____________  __________ Signature of Patient or Authorized Representative            Date                   Time    __________________________________________ Nurse's Signature

## 2012-07-16 NOTE — Progress Notes (Signed)
Patient appears to have a subacute clot in the right femoral veins with some recanalization. She is already  on Coumadin and therefore I do not think we need to do anything different. We will await the arterial study

## 2012-07-16 NOTE — Progress Notes (Signed)
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 She is accompanied by her husband and the only complaint she has his chronic right foot pain close to the ankle with some burning the bottom of the right foot some on the left but nothing like the right.  She is on gabapentin 100 mg 3 times a day by her for Dr. with no improvement. She does state that her cardiologist, Dr. Debara Pickett, mentioned to her that she might need work on her vessels of her feet. This was about a year ago but she does not remember having arterial Dopplers etc.  We will set up those Dopplers both of the veins and arteries and see if she has insufficiency and then decide what to do. With her falling and hip fracture and fasciotomy earlier this year I don't think increasing the gabapentin right now is what I want to do.

## 2012-07-21 DIAGNOSIS — I4891 Unspecified atrial fibrillation: Secondary | ICD-10-CM | POA: Diagnosis not present

## 2012-08-11 DIAGNOSIS — I4891 Unspecified atrial fibrillation: Secondary | ICD-10-CM | POA: Diagnosis not present

## 2012-08-16 ENCOUNTER — Ambulatory Visit (HOSPITAL_COMMUNITY): Payer: Medicare Other | Admitting: Oncology

## 2012-08-20 ENCOUNTER — Encounter (HOSPITAL_COMMUNITY): Payer: Self-pay | Admitting: Oncology

## 2012-08-20 ENCOUNTER — Encounter (HOSPITAL_COMMUNITY): Payer: Medicare Other | Attending: Oncology | Admitting: Oncology

## 2012-08-20 VITALS — BP 117/78 | HR 65 | Temp 97.8°F | Resp 16 | Wt 184.0 lb

## 2012-08-20 DIAGNOSIS — Z7901 Long term (current) use of anticoagulants: Secondary | ICD-10-CM

## 2012-08-20 DIAGNOSIS — D45 Polycythemia vera: Secondary | ICD-10-CM | POA: Diagnosis not present

## 2012-08-20 DIAGNOSIS — I771 Stricture of artery: Secondary | ICD-10-CM

## 2012-08-20 DIAGNOSIS — I4891 Unspecified atrial fibrillation: Secondary | ICD-10-CM | POA: Diagnosis not present

## 2012-08-20 LAB — COMPREHENSIVE METABOLIC PANEL
AST: 19 U/L (ref 0–37)
Albumin: 3.9 g/dL (ref 3.5–5.2)
Alkaline Phosphatase: 72 U/L (ref 39–117)
BUN: 18 mg/dL (ref 6–23)
Chloride: 104 mEq/L (ref 96–112)
Potassium: 4.6 mEq/L (ref 3.5–5.1)
Total Bilirubin: 0.9 mg/dL (ref 0.3–1.2)

## 2012-08-20 LAB — CBC WITH DIFFERENTIAL/PLATELET
Basophils Relative: 1 % (ref 0–1)
Hemoglobin: 15.1 g/dL — ABNORMAL HIGH (ref 12.0–15.0)
MCHC: 34.4 g/dL (ref 30.0–36.0)
Monocytes Relative: 3 % (ref 3–12)
Neutro Abs: 4.1 10*3/uL (ref 1.7–7.7)
Neutrophils Relative %: 74 % (ref 43–77)
RBC: 3.56 MIL/uL — ABNORMAL LOW (ref 3.87–5.11)
WBC: 5.6 10*3/uL (ref 4.0–10.5)
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
## 2012-08-20 NOTE — Patient Instructions (Addendum)
[facility] Discharge Instructions  RECOMMENDATIONS MADE BY THE CONSULTANT AND ANY TEST RESULTS WILL BE SENT TO YOUR REFERRING PHYSICIAN.  Lab work today. Lab work every 4 weeks. We will make a referral to a vascular surgeon. Return to clinic in 6 months to see MD.  Thank you for choosing [facility] to provide your oncology and hematology care.  To afford each patient quality time with our providers, please arrive at least 15 minutes before your scheduled appointment time.  With your help, our goal is to use those 15 minutes to complete the necessary work-up to ensure our physicians have the information they need to help with your evaluation and healthcare recommendations.    Effective January 1st, 2014, we ask that you re-schedule your appointment with our physicians should you arrive 10 or more minutes late for your appointment.  We strive to give you quality time with our providers, and arriving late affects you and other patients whose appointments are after yours.    Again, thank you for choosing [facility].  Our hope is that these requests will decrease the amount of time that you wait before being seen by our physicians.       _____________________________________________________________  Should you have questions after your visit to [facility], please contact our office at [phone] between the hours of 8:30 a.m. and 5:00 p.m.  Voicemails left after 4:30 p.m. will not be returned until the following business day.  For prescription refill requests, have your pharmacy contact our office with your prescription refill request.

## 2012-08-24 DIAGNOSIS — B351 Tinea unguium: Secondary | ICD-10-CM | POA: Diagnosis not present

## 2012-08-24 DIAGNOSIS — M79609 Pain in unspecified limb: Secondary | ICD-10-CM | POA: Diagnosis not present

## 2012-08-25 DIAGNOSIS — I4892 Unspecified atrial flutter: Secondary | ICD-10-CM | POA: Diagnosis not present

## 2012-08-31 ENCOUNTER — Other Ambulatory Visit: Payer: Self-pay | Admitting: *Deleted

## 2012-08-31 DIAGNOSIS — I83893 Varicose veins of bilateral lower extremities with other complications: Secondary | ICD-10-CM

## 2012-09-03 ENCOUNTER — Encounter: Payer: Self-pay | Admitting: Vascular Surgery

## 2012-09-03 ENCOUNTER — Other Ambulatory Visit: Payer: Self-pay | Admitting: *Deleted

## 2012-09-03 DIAGNOSIS — I739 Peripheral vascular disease, unspecified: Secondary | ICD-10-CM

## 2012-09-06 ENCOUNTER — Ambulatory Visit (INDEPENDENT_AMBULATORY_CARE_PROVIDER_SITE_OTHER): Payer: Medicare Other | Admitting: Vascular Surgery

## 2012-09-06 ENCOUNTER — Encounter (INDEPENDENT_AMBULATORY_CARE_PROVIDER_SITE_OTHER): Payer: Medicare Other | Admitting: *Deleted

## 2012-09-06 ENCOUNTER — Other Ambulatory Visit: Payer: Self-pay

## 2012-09-06 ENCOUNTER — Encounter: Payer: Self-pay | Admitting: Vascular Surgery

## 2012-09-06 ENCOUNTER — Encounter (HOSPITAL_COMMUNITY): Payer: Self-pay | Admitting: Pharmacy Technician

## 2012-09-06 VITALS — BP 156/97 | HR 84 | Resp 18 | Ht 72.0 in | Wt 184.0 lb

## 2012-09-06 DIAGNOSIS — I739 Peripheral vascular disease, unspecified: Secondary | ICD-10-CM

## 2012-09-06 DIAGNOSIS — I7092 Chronic total occlusion of artery of the extremities: Secondary | ICD-10-CM

## 2012-09-06 DIAGNOSIS — I83893 Varicose veins of bilateral lower extremities with other complications: Secondary | ICD-10-CM

## 2012-09-06 NOTE — Progress Notes (Signed)
Subjective:     Patient ID: Charlotte [name], female   DOB: [birth year], 72 y.o.   MRN: [id number]  HPI this 72 year old female was referred by Dr. Tressie Stalker for evaluation of right leg pain. Patient has had a previous fracture of the right pelvis which required additional surgery on the anterior aspect of the right thigh about one year ago. She has no known history of DVT. She has been having pain in the right lower leg particularly with ambulation. This limits her considerably. She is able to walk about 50 feet. She denies rest pain. 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 She is on chronic Coumadin therapy for atrial fibrillation but has had no embolic events  She also has polycythemia vera. She has no history of nonhealing ulcers infection gangrene and rest pain in the right foot.  Past Medical History  Diagnosis Date  . Hypertension   . Arthritis   . Varicose veins   . Deaf   . Elevated WBC count     and platelets  . Thyroid disease   . Irregular heart rate   . Polycythemia vera   . Peripheral neuropathy     feet  . Polycythemia vera 10/01/2011  . Atrial fibrillation, chronic   . 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 Smoking status: Never Smoker   . Smokeless tobacco: Never Used  . Alcohol Use: No    Family History  Problem Relation Age of Onset  . Diabetes Son     No Known Allergies  Current outpatient prescriptions:aspirin EC 81 MG tablet, Take 81 mg by mouth daily., Disp: , Rfl: ;  diltiazem (DILACOR XR) 180 MG 24 hr capsule, Take 180 mg by mouth daily., Disp: , Rfl: ;  fish oil-omega-3 fatty acids 1000 MG capsule, Take 1,200 mg by mouth daily., Disp: , Rfl: ;  fluticasone (CUTIVATE) 0.05 % cream, Apply 1 application topically. Apply to right ankle and foot, Disp: , Rfl:  gabapentin (NEURONTIN) 100 MG capsule, Take 100 mg by mouth 3 (three) times daily.  , Disp: , Rfl: ;  hydroxyurea (HYDREA) 500 MG capsule, , Disp: ,  Rfl: ;  levothyroxine (SYNTHROID, LEVOTHROID) 25 MCG tablet, Take 25 mcg by mouth daily.  , Disp: , Rfl: ;  metoprolol (TOPROL-XL) 100 MG 24 hr tablet, Take 100 mg by mouth 2 (two) times daily.  , Disp: , Rfl:  warfarin (COUMADIN) 5 MG tablet, Take 5 mg by mouth daily. Takes 1 tablet Mondays, Tuesdays, Thursdays, Saturdays and Sundays, Disp: , Rfl: ;  warfarin (COUMADIN) 7.5 MG tablet, Take 1 tablet (7.5 mg total) by mouth every Monday, Wednesday, and Friday at 6 PM., Disp: 30 tablet, Rfl: 1  BP 156/97  Pulse 84  Resp 18  Ht 6' (1.829 m)  Wt 184 lb (83.462 kg)  BMI 24.95 kg/m2  Body mass index is 24.95 kg/(m^2).       Review of Systems denies chest pain, dyspnea on exertion, PND, orthopnea. Does have history of varicose veins with darkening of the skin     Objective:   Physical Exam blood pressure 1.6 and 97 heart rate 84 respirations 18 Gen.-alert and oriented x3 in no apparent distress HEENT normal for age Lungs no rhonchi or wheezing Cardiovascular regular rhythm no murmurs carotid pulses 3+ palpable no bruits audible Abdomen soft nontender no palpable masses Musculoskeletal free of  major deformities Skin clear -no rashes Neurologic normal Lower extremities-right leg with  absent femoral and distal pulses. Motion and sensation intact. Evidence of chronic venous insufficiency with hyperpigmentation lower third right leg with no active ulcers. Multiple reticular and spider veins in the right lower leg. Left leg 3+ femoral 2+ popliteal 2+ posterior tibial pulse palpable. Evidence of chronic venous insufficiency left leg but not as severe as right. No active ulcers noted.  Today I ordered bi  lateral lower extremity arterial study which revealed right common femoral occlusion with ABI of 0.54 left leg with ABI 1.08 Also ordered right leg venous duplex exam. This revealed reflux in the right great saphenous vein in the right small saphenous vein. There is also evidence of previous DVT  with some chronic deep venous thrombosis in the right superficial femoral and popliteal veins as well as the tibial veins.    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 Trula Slade on Tuesday, February 11 #3 surgery on Wednesday-either right femoral endarterectomy or left-to-right femoral-femoral bypass depending on findings of angiogram Discussed this with patient and she would like to proceed

## 2012-09-07 ENCOUNTER — Other Ambulatory Visit: Payer: Self-pay

## 2012-09-13 MED ORDER — DEXTROSE 5 % IV SOLN
1.5000 g | INTRAVENOUS | Status: DC
  Filled 2012-09-13: qty 1.5

## 2012-09-14 ENCOUNTER — Inpatient Hospital Stay (HOSPITAL_COMMUNITY)
Admission: RE | Admit: 2012-09-14 | Discharge: 2012-09-18 | DRG: 253 | Disposition: A | Discharge status: Home / Self Care | Payer: Medicare Other | Source: Ambulatory Visit | Attending: Vascular Surgery | Admitting: Vascular Surgery

## 2012-09-14 ENCOUNTER — Encounter (HOSPITAL_COMMUNITY): Payer: Medicare Other

## 2012-09-14 ENCOUNTER — Encounter (HOSPITAL_COMMUNITY)
Admission: RE | Disposition: A | Discharge status: Home / Self Care | Payer: Self-pay | Source: Ambulatory Visit | Attending: Vascular Surgery

## 2012-09-14 ENCOUNTER — Other Ambulatory Visit: Payer: Self-pay

## 2012-09-14 DIAGNOSIS — D45 Polycythemia vera: Secondary | ICD-10-CM | POA: Diagnosis present

## 2012-09-14 DIAGNOSIS — I70219 Atherosclerosis of native arteries of extremities with intermittent claudication, unspecified extremity: Secondary | ICD-10-CM

## 2012-09-14 DIAGNOSIS — H9190 Unspecified hearing loss, unspecified ear: Secondary | ICD-10-CM | POA: Diagnosis present

## 2012-09-14 DIAGNOSIS — I82509 Chronic embolism and thrombosis of unspecified deep veins of unspecified lower extremity: Secondary | ICD-10-CM | POA: Diagnosis present

## 2012-09-14 DIAGNOSIS — G609 Hereditary and idiopathic neuropathy, unspecified: Secondary | ICD-10-CM | POA: Diagnosis not present

## 2012-09-14 DIAGNOSIS — I872 Venous insufficiency (chronic) (peripheral): Secondary | ICD-10-CM | POA: Diagnosis present

## 2012-09-14 DIAGNOSIS — I7092 Chronic total occlusion of artery of the extremities: Secondary | ICD-10-CM

## 2012-09-14 DIAGNOSIS — I10 Essential (primary) hypertension: Secondary | ICD-10-CM | POA: Diagnosis present

## 2012-09-14 DIAGNOSIS — Z833 Family history of diabetes mellitus: Secondary | ICD-10-CM

## 2012-09-14 DIAGNOSIS — I4891 Unspecified atrial fibrillation: Secondary | ICD-10-CM | POA: Diagnosis present

## 2012-09-14 HISTORY — PX: ABDOMINAL AORTAGRAM: SHX5454

## 2012-09-14 LAB — POCT I-STAT, CHEM 8
BUN: 20 mg/dL (ref 6–23)
Calcium, Ion: 1.23 mmol/L (ref 1.13–1.30)
Chloride: 109 mEq/L (ref 96–112)
Creatinine, Ser: 1.4 mg/dL — ABNORMAL HIGH (ref 0.50–1.10)
Sodium: 141 mEq/L (ref 135–145)
TCO2: 22 mmol/L (ref 0–100)

## 2012-09-14 LAB — APTT
aPTT: 41 seconds — ABNORMAL HIGH (ref 24–37)
aPTT: 38 seconds — ABNORMAL HIGH (ref 24–37)

## 2012-09-14 LAB — ABO/RH: ABO/RH(D): A POS

## 2012-09-14 LAB — URINALYSIS, ROUTINE W REFLEX MICROSCOPIC
Bilirubin Urine: NEGATIVE
Ketones, ur: NEGATIVE mg/dL
Nitrite: NEGATIVE
Protein, ur: NEGATIVE mg/dL
Urobilinogen, UA: 1 mg/dL (ref 0.0–1.0)

## 2012-09-14 LAB — PROTIME-INR
INR: 1.3 (ref 0.00–1.49)
Prothrombin Time: 15.9 seconds — ABNORMAL HIGH (ref 11.6–15.2)

## 2012-09-14 SURGERY — ABDOMINAL AORTAGRAM
Anesthesia: LOCAL

## 2012-09-14 MED ORDER — SODIUM CHLORIDE 0.9 % IV SOLN: INTRAVENOUS | Status: DC

## 2012-09-14 MED ORDER — HYDROXYUREA 500 MG PO CAPS
500.0000 mg | ORAL_CAPSULE | Freq: Every day | ORAL | Status: DC
  Filled 2012-09-14: qty 1

## 2012-09-14 MED ORDER — METOPROLOL TARTRATE 1 MG/ML IV SOLN: 2.0000 mg | INTRAVENOUS | Status: DC | PRN

## 2012-09-14 MED ORDER — DEXTROSE 5 % IV SOLN
1.5000 g | INTRAVENOUS | Status: AC
  Administered 2012-09-15: 1.5 g via INTRAVENOUS
  Filled 2012-09-14: qty 1.5

## 2012-09-14 MED ORDER — SODIUM CHLORIDE 0.9 % IV SOLN: 1.0000 mL/kg/h | INTRAVENOUS | Status: DC

## 2012-09-14 MED ORDER — ACETAMINOPHEN 650 MG RE SUPP: 325.0000 mg | RECTAL | Status: DC | PRN

## 2012-09-14 MED ORDER — ALUM & MAG HYDROXIDE-SIMETH 200-200-20 MG/5ML PO SUSP: 15.0000 mL | ORAL | Status: DC | PRN

## 2012-09-14 MED ORDER — GABAPENTIN 100 MG PO CAPS
100.0000 mg | ORAL_CAPSULE | Freq: Three times a day (TID) | ORAL | Status: DC
  Administered 2012-09-14 – 2012-09-17 (×10): 100 mg via ORAL
  Filled 2012-09-14 (×15): qty 1

## 2012-09-14 MED ORDER — LIDOCAINE HCL (PF) 1 % IJ SOLN
INTRAMUSCULAR | Status: AC
  Filled 2012-09-14: qty 30

## 2012-09-14 MED ORDER — HYDROXYUREA 500 MG PO CAPS
1000.0000 mg | ORAL_CAPSULE | Freq: Every day | ORAL | Status: DC
  Administered 2012-09-14 – 2012-09-17 (×4): 1000 mg via ORAL
  Filled 2012-09-14 (×6): qty 2

## 2012-09-14 MED ORDER — OXYCODONE-ACETAMINOPHEN 5-325 MG PO TABS
1.0000 | ORAL_TABLET | ORAL | Status: DC | PRN
  Administered 2012-09-16: 2 via ORAL
  Filled 2012-09-14: qty 2

## 2012-09-14 MED ORDER — ACETAMINOPHEN 325 MG PO TABS: 325.0000 mg | ORAL_TABLET | ORAL | Status: DC | PRN

## 2012-09-14 MED ORDER — MORPHINE SULFATE 2 MG/ML IJ SOLN
2.0000 mg | INTRAMUSCULAR | Status: DC | PRN
  Administered 2012-09-16: 2 mg via INTRAVENOUS
  Filled 2012-09-14: qty 1

## 2012-09-14 MED ORDER — CLONIDINE HCL 0.2 MG PO TABS
0.2000 mg | ORAL_TABLET | ORAL | Status: DC | PRN
  Administered 2012-09-14: 0.2 mg via ORAL
  Filled 2012-09-14 (×2): qty 1

## 2012-09-14 MED ORDER — MIDAZOLAM HCL 2 MG/2ML IJ SOLN
INTRAMUSCULAR | Status: AC
  Filled 2012-09-14: qty 2

## 2012-09-14 MED ORDER — GUAIFENESIN-DM 100-10 MG/5ML PO SYRP: 15.0000 mL | ORAL_SOLUTION | ORAL | Status: DC | PRN

## 2012-09-14 MED ORDER — PHENOL 1.4 % MT LIQD
1.0000 | OROMUCOSAL | Status: DC | PRN
  Filled 2012-09-14: qty 177

## 2012-09-14 MED ORDER — ONDANSETRON HCL 4 MG/2ML IJ SOLN: 4.0000 mg | Freq: Four times a day (QID) | INTRAMUSCULAR | Status: DC | PRN

## 2012-09-14 MED ORDER — FENTANYL CITRATE 0.05 MG/ML IJ SOLN
INTRAMUSCULAR | Status: AC
  Filled 2012-09-14: qty 2

## 2012-09-14 MED ORDER — METOPROLOL SUCCINATE ER 100 MG PO TB24
100.0000 mg | ORAL_TABLET | Freq: Two times a day (BID) | ORAL | Status: DC
  Administered 2012-09-14 – 2012-09-17 (×6): 100 mg via ORAL
  Filled 2012-09-14 (×10): qty 1

## 2012-09-14 MED ORDER — SODIUM CHLORIDE 0.9 % IV SOLN
INTRAVENOUS | Status: DC
  Administered 2012-09-14 – 2012-09-16 (×4): via INTRAVENOUS

## 2012-09-14 MED ORDER — SODIUM CHLORIDE 0.9 % IV SOLN
INTRAVENOUS | Status: DC
  Administered 2012-09-14: 06:00:00 via INTRAVENOUS

## 2012-09-14 MED ORDER — LEVOTHYROXINE SODIUM 25 MCG PO TABS
25.0000 ug | ORAL_TABLET | Freq: Every day | ORAL | Status: DC
  Administered 2012-09-15 – 2012-09-18 (×4): 25 ug via ORAL
  Filled 2012-09-14 (×6): qty 1

## 2012-09-14 MED ORDER — ASPIRIN EC 81 MG PO TBEC
81.0000 mg | DELAYED_RELEASE_TABLET | Freq: Every day | ORAL | Status: DC
  Administered 2012-09-14 – 2012-09-17 (×3): 81 mg via ORAL
  Filled 2012-09-14 (×5): qty 1

## 2012-09-14 MED ORDER — DILTIAZEM HCL ER 180 MG PO CP24
180.0000 mg | ORAL_CAPSULE | Freq: Every day | ORAL | Status: DC
  Administered 2012-09-16 – 2012-09-17 (×2): 180 mg via ORAL
  Filled 2012-09-14 (×4): qty 1

## 2012-09-14 MED ORDER — HYDROXYUREA 500 MG PO CAPS
500.0000 mg | ORAL_CAPSULE | Freq: Every day | ORAL | Status: DC
  Administered 2012-09-16 – 2012-09-17 (×2): 500 mg via ORAL
  Filled 2012-09-14 (×4): qty 1

## 2012-09-14 NOTE — Care Management Note (Signed)
    Page 1 of 1   09/17/2012     4:21:57 PM   CARE MANAGEMENT NOTE 09/17/2012  Patient:  Charlotte [name], Charlotte [name]   Account Number:  [id number]  Date Initiated:  09/14/2012  Documentation initiated by:  Kaleia Longhi  Subjective/Objective Assessment:   PT S/P AORTOGRAM 09/14/12.  PTA, PT INDEPENDENT, LIVES WITH SPOUSE.  FEM POP SCHEDULED FOR 2/12.     Action/Plan:   HUSBAND AND SON TO PROVIDE CARE AT DC.  WILL FOLLOW FOR HOME NEEDS AS PT PROGRESSES.   Anticipated DC Date:  09/17/2012   Anticipated DC Plan:  [place]  CM consult      [facility]  HOME HEALTH   Choice offered to / List presented to:  [name]        [place] arranged  [place] PT      [place].   Status of service:  Completed, signed off Medicare Important Message given?   (If response is "NO", the following Medicare IM given date fields will be blank) Date Medicare IM given:   Date Additional Medicare IM given:    Discharge Disposition:    Per UR Regulation:  Reviewed for med. necessity/level of care/duration of stay  If discussed at [place] of Stay Meetings, dates discussed:    Comments:  09/17/12 Ardith Test,RN,BSN [id number] PT [place].  WILL NEED HHPT FOLLOW UP. REFERRAL TO [facility], PER PT CHOICE.   START OF CARE 24-48H POST DC DATE.  PT HAS RW AT HOME.

## 2012-09-14 NOTE — H&P (View-Only) (Signed)
Subjective:     Patient ID: Charlotte [name], female   DOB: June 18, 1941, 72 y.o.   MRN: [id number]  HPI this 72 year old female was referred by Dr. Tressie Stalker for evaluation of right leg pain. Patient has had a previous fracture of the right pelvis which required additional surgery on the anterior aspect of the right thigh about one year ago. She has no known history of DVT. She has been having pain in the right lower leg particularly with ambulation. This limits her considerably. She is able to walk about 50 feet. She denies rest pain. 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 She is on chronic Coumadin therapy for atrial fibrillation but has had no embolic events  She also has polycythemia vera. She has no history of nonhealing ulcers infection gangrene and rest pain in the right foot.  Past Medical History  Diagnosis Date  . Hypertension   . Arthritis   . Varicose veins   . Deaf   . Elevated WBC count     and platelets  . Thyroid disease   . Irregular heart rate   . Polycythemia vera   . Peripheral neuropathy     feet  . Polycythemia vera 10/01/2011  . Atrial fibrillation, chronic   . 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 Smoking status: Never Smoker   . Smokeless tobacco: Never Used  . Alcohol Use: No    Family History  Problem Relation Age of Onset  . Diabetes Son     No Known Allergies  Current outpatient prescriptions:aspirin EC 81 MG tablet, Take 81 mg by mouth daily., Disp: , Rfl: ;  diltiazem (DILACOR XR) 180 MG 24 hr capsule, Take 180 mg by mouth daily., Disp: , Rfl: ;  fish oil-omega-3 fatty acids 1000 MG capsule, Take 1,200 mg by mouth daily., Disp: , Rfl: ;  fluticasone (CUTIVATE) 0.05 % cream, Apply 1 application topically. Apply to right ankle and foot, Disp: , Rfl:  gabapentin (NEURONTIN) 100 MG capsule, Take 100 mg by mouth 3 (three) times daily.  , Disp: , Rfl: ;  hydroxyurea (HYDREA) 500 MG capsule, , Disp: ,  Rfl: ;  levothyroxine (SYNTHROID, LEVOTHROID) 25 MCG tablet, Take 25 mcg by mouth daily.  , Disp: , Rfl: ;  metoprolol (TOPROL-XL) 100 MG 24 hr tablet, Take 100 mg by mouth 2 (two) times daily.  , Disp: , Rfl:  warfarin (COUMADIN) 5 MG tablet, Take 5 mg by mouth daily. Takes 1 tablet Mondays, Tuesdays, Thursdays, Saturdays and Sundays, Disp: , Rfl: ;  warfarin (COUMADIN) 7.5 MG tablet, Take 1 tablet (7.5 mg total) by mouth every Monday, Wednesday, and Friday at 6 PM., Disp: 30 tablet, Rfl: 1  BP 156/97  Pulse 84  Resp 18  Ht 6' (1.829 m)  Wt 184 lb (83.462 kg)  BMI 24.95 kg/m2  Body mass index is 24.95 kg/(m^2).       Review of Systems denies chest pain, dyspnea on exertion, PND, orthopnea. Does have history of varicose veins with darkening of the skin     Objective:   Physical Exam blood pressure 1.6 and 97 heart rate 84 respirations 18 Gen.-alert and oriented x3 in no apparent distress HEENT normal for age Lungs no rhonchi or wheezing Cardiovascular regular rhythm no murmurs carotid pulses 3+ palpable no bruits audible Abdomen soft nontender no palpable masses Musculoskeletal free of  major deformities Skin clear -no rashes Neurologic normal Lower extremities-right leg with  absent femoral and distal pulses. Motion and sensation intact. Evidence of chronic venous insufficiency with hyperpigmentation lower third right leg with no active ulcers. Multiple reticular and spider veins in the right lower leg. Left leg 3+ femoral 2+ popliteal 2+ posterior tibial pulse palpable. Evidence of chronic venous insufficiency left leg but not as severe as right. No active ulcers noted.  Today I ordered bi  lateral lower extremity arterial study which revealed right common femoral occlusion with ABI of 0.54 left leg with ABI 1.08 Also ordered right leg venous duplex exam. This revealed reflux in the right great saphenous vein in the right small saphenous vein. There is also evidence of previous DVT  with some chronic deep venous thrombosis in the right superficial femoral and popliteal veins as well as the tibial veins.    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 Trula Slade on Tuesday, February 11 #3 surgery on Wednesday-either right femoral endarterectomy or left-to-right femoral-femoral bypass depending on findings of angiogram Discussed this with patient and she would like to proceed

## 2012-09-14 NOTE — Interval H&P Note (Signed)
History and Physical Interval Note:  09/14/2012 6:56 AM  Charlotte [name]  has presented today for surgery, with the diagnosis of PVD  The various methods of treatment have been discussed with the patient and family. After consideration of risks, benefits and other options for treatment, the patient has consented to  Procedure(s): ABDOMINAL AORTAGRAM (N/A) as a surgical intervention .  The patient's history has been reviewed, patient examined, no change in status, stable for surgery.  I have reviewed the patient's chart and labs.  Questions were answered to the patient's satisfaction.     Marcellino Fidalgo IV, V. WELLS

## 2012-09-14 NOTE — Op Note (Signed)
09/14/2012 Pre-operative Diagnosis: Right leg claudication Post-operative diagnosis:  Same Surgeon:  Eldridge Abrahams Procedure Performed:  1.  ultrasound-guided access, left femoral artery  2.  abdominal aortogram  3.  bilateral lower extremity runoff  4.  second order catheterization   Indications:  The patient suffers from venous insufficiency as well as arterial insufficiency. Duplex ultrasound revealed a possible occluded femoral artery on the right. She comes in today for further evaluation and possible treatment.  Procedure:  The patient was identified in the holding area and taken to room 8.  The patient was then placed supine on the table and prepped and draped in the usual sterile fashion.  A time out was called.  Ultrasound was used to evaluate the left common femoral artery.  It was patent .  A digital ultrasound image was acquired.  A micropuncture needle was used to access the left common femoral artery under ultrasound guidance.  An 018 wire was advanced without resistance and a micropuncture sheath was placed.  The 018 wire was removed and a benson wire was placed.  The micropuncture sheath was exchanged for a 5 french sheath.  An omniflush catheter was advanced over the wire to the level of L-1.  An abdominal angiogram was obtained.  Next, using the omniflush catheter and a benson wire, the aortic bifurcation was crossed and the catheter was placed into theright external iliac artery and right runoff was obtained.  left runoff was performed via retrograde sheath injections.  Findings:   Aortogram:  The visualized portions of the suprarenal abdominal aorta showed no significant stenosis. The catheter was slightly below the renal arteries at the time of the abdominal aortogram. Both renal arteries are visualized. No obvious renal artery stenosis is identified. The infrarenal abdominal aorta is widely patent. The left common and external iliac arteries arriving patent. The right  common iliac artery is widely patent the right external iliac artery is widely patent  Right Lower Extremity:  There is acute occlusion of the right common femoral artery which extends into the proximal profunda femoral artery as well as the proximal superficial femoral artery. There is reconstitution of the distal superficial femoral artery. The popliteal artery is widely patent. There is two-vessel runoff via the anterior tibial and peroneal artery.  Left Lower Extremity:  Left common femoral artery is widely patent as is the profunda femoral artery. There is mild disease within the left superficial femoral artery with out evidence of hemodynamically significant stenosis. The popliteal artery is widely patent. The dominant runoff is the posterior tibial artery. The peroneal and anterior tibial artery are superimposed at the ankle. The anterior tibial artery appears to occlude at the ankle.  Intervention:  None performed today  Impression:  #1  right common femoral, profundofemoral, and superficial femoral artery occlusion with reconstitution of the distal superficial femoral artery with 2 vessel runoff via the anterior tibial and peroneal artery    V. Annamarie Major, M.D. [facility] Office: [phone] Pager:  [phone]

## 2012-09-15 ENCOUNTER — Encounter (HOSPITAL_COMMUNITY): Payer: Self-pay | Admitting: *Deleted

## 2012-09-15 ENCOUNTER — Encounter (HOSPITAL_COMMUNITY): Payer: Self-pay | Admitting: Anesthesiology

## 2012-09-15 ENCOUNTER — Ambulatory Visit (HOSPITAL_COMMUNITY): Payer: Medicare Other

## 2012-09-15 ENCOUNTER — Ambulatory Visit (HOSPITAL_COMMUNITY): Payer: Medicare Other | Admitting: *Deleted

## 2012-09-15 ENCOUNTER — Encounter (HOSPITAL_COMMUNITY)
Admission: RE | Disposition: A | Discharge status: Home / Self Care | Payer: Self-pay | Source: Ambulatory Visit | Attending: Vascular Surgery

## 2012-09-15 ENCOUNTER — Inpatient Hospital Stay (HOSPITAL_COMMUNITY): Admission: RE | Admit: 2012-09-15 | Payer: Medicare Other | Source: Ambulatory Visit | Admitting: Vascular Surgery

## 2012-09-15 DIAGNOSIS — I745 Embolism and thrombosis of iliac artery: Secondary | ICD-10-CM | POA: Diagnosis not present

## 2012-09-15 DIAGNOSIS — J9819 Other pulmonary collapse: Secondary | ICD-10-CM | POA: Diagnosis not present

## 2012-09-15 DIAGNOSIS — I70219 Atherosclerosis of native arteries of extremities with intermittent claudication, unspecified extremity: Secondary | ICD-10-CM | POA: Diagnosis not present

## 2012-09-15 DIAGNOSIS — I743 Embolism and thrombosis of arteries of the lower extremities: Secondary | ICD-10-CM | POA: Diagnosis not present

## 2012-09-15 DIAGNOSIS — I4891 Unspecified atrial fibrillation: Secondary | ICD-10-CM | POA: Diagnosis not present

## 2012-09-15 DIAGNOSIS — G609 Hereditary and idiopathic neuropathy, unspecified: Secondary | ICD-10-CM | POA: Diagnosis not present

## 2012-09-15 DIAGNOSIS — I70209 Unspecified atherosclerosis of native arteries of extremities, unspecified extremity: Secondary | ICD-10-CM | POA: Diagnosis not present

## 2012-09-15 DIAGNOSIS — Z01818 Encounter for other preprocedural examination: Secondary | ICD-10-CM | POA: Diagnosis not present

## 2012-09-15 DIAGNOSIS — I10 Essential (primary) hypertension: Secondary | ICD-10-CM | POA: Diagnosis not present

## 2012-09-15 DIAGNOSIS — I872 Venous insufficiency (chronic) (peripheral): Secondary | ICD-10-CM | POA: Diagnosis not present

## 2012-09-15 DIAGNOSIS — I82509 Chronic embolism and thrombosis of unspecified deep veins of unspecified lower extremity: Secondary | ICD-10-CM | POA: Diagnosis not present

## 2012-09-15 HISTORY — PX: FEMORAL-POPLITEAL BYPASS GRAFT: SHX937

## 2012-09-15 HISTORY — PX: ENDARTERECTOMY FEMORAL: SHX5804

## 2012-09-15 HISTORY — PX: PATCH ANGIOPLASTY: SHX6230

## 2012-09-15 LAB — BASIC METABOLIC PANEL
BUN: 16 mg/dL (ref 6–23)
Calcium: 8.9 mg/dL (ref 8.4–10.5)
GFR calc non Af Amer: 89 mL/min — ABNORMAL LOW (ref >90)
Glucose, Bld: 95 mg/dL (ref 70–99)
Potassium: 3.6 mEq/L (ref 3.5–5.1)

## 2012-09-15 LAB — CBC
Hemoglobin: 14.3 g/dL (ref 12.0–15.0)
MCH: 41.6 pg — ABNORMAL HIGH (ref 26.0–34.0)
MCHC: 34.5 g/dL (ref 30.0–36.0)

## 2012-09-15 LAB — PROTIME-INR: Prothrombin Time: 16.3 seconds — ABNORMAL HIGH (ref 11.6–15.2)

## 2012-09-15 SURGERY — ENDARTERECTOMY, FEMORAL
Anesthesia: General | Site: Leg Upper | Laterality: Right | Wound class: Clean

## 2012-09-15 MED ORDER — SODIUM CHLORIDE 0.9 % IV SOLN: 500.0000 mL | Freq: Once | INTRAVENOUS | Status: AC | PRN

## 2012-09-15 MED ORDER — LACTATED RINGERS IV SOLN
INTRAVENOUS | Status: DC
  Administered 2012-09-15: 11:00:00 via INTRAVENOUS

## 2012-09-15 MED ORDER — DEXTROSE 5 % IV SOLN
1.5000 g | Freq: Two times a day (BID) | INTRAVENOUS | Status: AC
  Administered 2012-09-16 (×2): 1.5 g via INTRAVENOUS
  Filled 2012-09-15 (×2): qty 1.5

## 2012-09-15 MED ORDER — HYDROMORPHONE HCL PF 1 MG/ML IJ SOLN: 0.2500 mg | INTRAMUSCULAR | Status: DC | PRN

## 2012-09-15 MED ORDER — 0.9 % SODIUM CHLORIDE (POUR BTL) OPTIME
TOPICAL | Status: DC | PRN
  Administered 2012-09-15 (×2): 1000 mL

## 2012-09-15 MED ORDER — DOPAMINE-DEXTROSE 3.2-5 MG/ML-% IV SOLN
3.0000 ug/kg/min | INTRAVENOUS | Status: DC | PRN
  Filled 2012-09-15: qty 250

## 2012-09-15 MED ORDER — SODIUM CHLORIDE 0.9 % IR SOLN
Status: DC | PRN
  Administered 2012-09-15: 11:00:00

## 2012-09-15 MED ORDER — LACTATED RINGERS IV SOLN
INTRAVENOUS | Status: DC | PRN
  Administered 2012-09-15 (×2): via INTRAVENOUS

## 2012-09-15 MED ORDER — ONDANSETRON HCL 4 MG/2ML IJ SOLN
INTRAMUSCULAR | Status: AC
  Filled 2012-09-15: qty 2

## 2012-09-15 MED ORDER — FENTANYL CITRATE 0.05 MG/ML IJ SOLN
INTRAMUSCULAR | Status: DC | PRN
  Administered 2012-09-15 (×2): 100 ug via INTRAVENOUS
  Administered 2012-09-15 (×3): 25 ug via INTRAVENOUS
  Administered 2012-09-15: 50 ug via INTRAVENOUS

## 2012-09-15 MED ORDER — PANTOPRAZOLE SODIUM 40 MG PO TBEC
40.0000 mg | DELAYED_RELEASE_TABLET | Freq: Every day | ORAL | Status: DC
  Administered 2012-09-16 – 2012-09-17 (×3): 40 mg via ORAL
  Filled 2012-09-15 (×3): qty 1

## 2012-09-15 MED ORDER — POTASSIUM CHLORIDE CRYS ER 20 MEQ PO TBCR: 20.0000 meq | EXTENDED_RELEASE_TABLET | Freq: Once | ORAL | Status: AC | PRN

## 2012-09-15 MED ORDER — DOCUSATE SODIUM 100 MG PO CAPS
100.0000 mg | ORAL_CAPSULE | Freq: Every day | ORAL | Status: DC
  Administered 2012-09-16 – 2012-09-17 (×2): 100 mg via ORAL
  Filled 2012-09-15 (×3): qty 1

## 2012-09-15 MED ORDER — PROMETHAZINE HCL 25 MG/ML IJ SOLN
6.2500 mg | INTRAMUSCULAR | Status: DC | PRN
  Administered 2012-09-15: 6.25 mg via INTRAVENOUS

## 2012-09-15 MED ORDER — PROMETHAZINE HCL 25 MG/ML IJ SOLN
INTRAMUSCULAR | Status: AC
  Administered 2012-09-15: 6.25 mg via INTRAVENOUS
  Filled 2012-09-15: qty 1

## 2012-09-15 MED ORDER — PROTAMINE SULFATE 10 MG/ML IV SOLN
INTRAVENOUS | Status: DC | PRN
  Administered 2012-09-15: 10 mg via INTRAVENOUS
  Administered 2012-09-15: 40 mg via INTRAVENOUS

## 2012-09-15 MED ORDER — ONDANSETRON HCL 4 MG/2ML IJ SOLN
INTRAMUSCULAR | Status: DC | PRN
  Administered 2012-09-15: 4 mg via INTRAVENOUS

## 2012-09-15 MED ORDER — LIDOCAINE HCL (CARDIAC) 20 MG/ML IV SOLN
INTRAVENOUS | Status: DC | PRN
  Administered 2012-09-15: 100 mg via INTRAVENOUS

## 2012-09-15 MED ORDER — ONDANSETRON HCL 4 MG/2ML IJ SOLN
4.0000 mg | Freq: Once | INTRAMUSCULAR | Status: AC | PRN
  Administered 2012-09-15: 4 mg via INTRAVENOUS

## 2012-09-15 MED ORDER — ROCURONIUM BROMIDE 100 MG/10ML IV SOLN
INTRAVENOUS | Status: DC | PRN
  Administered 2012-09-15: 30 mg via INTRAVENOUS
  Administered 2012-09-15 (×2): 20 mg via INTRAVENOUS

## 2012-09-15 MED ORDER — NEOSTIGMINE METHYLSULFATE 1 MG/ML IJ SOLN
INTRAMUSCULAR | Status: DC | PRN
  Administered 2012-09-15: 3 mg via INTRAVENOUS

## 2012-09-15 MED ORDER — PHENYLEPHRINE HCL 10 MG/ML IJ SOLN
INTRAMUSCULAR | Status: DC | PRN
  Administered 2012-09-15 (×8): 40 ug via INTRAVENOUS

## 2012-09-15 MED ORDER — PROPOFOL 10 MG/ML IV BOLUS
INTRAVENOUS | Status: DC | PRN
  Administered 2012-09-15: 20 mg via INTRAVENOUS
  Administered 2012-09-15: 130 mg via INTRAVENOUS

## 2012-09-15 MED ORDER — HEPARIN SODIUM (PORCINE) 1000 UNIT/ML IJ SOLN
INTRAMUSCULAR | Status: DC | PRN
  Administered 2012-09-15: 6000 [IU] via INTRAVENOUS

## 2012-09-15 MED ORDER — GLYCOPYRROLATE 0.2 MG/ML IJ SOLN
INTRAMUSCULAR | Status: DC | PRN
  Administered 2012-09-15: 0.4 mg via INTRAVENOUS

## 2012-09-15 MED ORDER — LIDOCAINE HCL 4 % MT SOLN
OROMUCOSAL | Status: DC | PRN
  Administered 2012-09-15: 4 mL via TOPICAL

## 2012-09-15 MED ORDER — BISACODYL 10 MG RE SUPP: 10.0000 mg | Freq: Every day | RECTAL | Status: DC | PRN

## 2012-09-15 SURGICAL SUPPLY — 64 items
BANDAGE ESMARK 6X9 LF (GAUZE/BANDAGES/DRESSINGS) IMPLANT
BNDG ESMARK 6X9 LF (GAUZE/BANDAGES/DRESSINGS)
BOOT SUTURE AID YELLOW STND (SUTURE) IMPLANT
CANISTER SUCTION 2500CC (MISCELLANEOUS) ×3 IMPLANT
CLIP TI MEDIUM 24 (CLIP) ×3 IMPLANT
CLIP TI WIDE RED SMALL 24 (CLIP) ×3 IMPLANT
CLOTH BEACON ORANGE TIMEOUT ST (SAFETY) ×3 IMPLANT
COVER SURGICAL LIGHT HANDLE (MISCELLANEOUS) ×3 IMPLANT
DECANTER SPIKE VIAL GLASS SM (MISCELLANEOUS) IMPLANT
DERMABOND ADVANCED (GAUZE/BANDAGES/DRESSINGS) ×2
DERMABOND ADVANCED .7 DNX12 (GAUZE/BANDAGES/DRESSINGS) ×4 IMPLANT
DRAIN SNY 10X20 3/4 PERF (WOUND CARE) IMPLANT
DRAPE WARM FLUID 44X44 (DRAPE) ×3 IMPLANT
DRAPE X-RAY CASS 24X20 (DRAPES) IMPLANT
DRSG COVADERM 4X8 (GAUZE/BANDAGES/DRESSINGS) ×6 IMPLANT
ELECT REM PT RETURN 9FT ADLT (ELECTROSURGICAL) ×3
ELECTRODE REM PT RTRN 9FT ADLT (ELECTROSURGICAL) ×2 IMPLANT
EVACUATOR SILICONE 100CC (DRAIN) IMPLANT
GLOVE BIO SURGEON STRL SZ 6.5 (GLOVE) ×3 IMPLANT
GLOVE BIOGEL PI IND STRL 6.5 (GLOVE) ×6 IMPLANT
GLOVE BIOGEL PI IND STRL 7.0 (GLOVE) ×2 IMPLANT
GLOVE BIOGEL PI INDICATOR 6.5 (GLOVE) ×3
GLOVE BIOGEL PI INDICATOR 7.0 (GLOVE) ×1
GLOVE ECLIPSE 7.5 STRL STRAW (GLOVE) ×6 IMPLANT
GLOVE SS BIOGEL STRL SZ 7 (GLOVE) ×2 IMPLANT
GLOVE SUPERSENSE BIOGEL SZ 7 (GLOVE) ×1
GOWN PREVENTION PLUS XLARGE (GOWN DISPOSABLE) ×6 IMPLANT
GOWN STRL NON-REIN LRG LVL3 (GOWN DISPOSABLE) ×9 IMPLANT
GRAFT GORETEX 6X40 (Vascular Products) ×3 IMPLANT
HEMASHIELD FINESSE CARDIO (Vascular Products) ×3 IMPLANT
INSERT FOGARTY SM (MISCELLANEOUS) ×3 IMPLANT
KIT BASIN OR (CUSTOM PROCEDURE TRAY) ×3 IMPLANT
KIT ROOM TURNOVER OR (KITS) ×3 IMPLANT
NS IRRIG 1000ML POUR BTL (IV SOLUTION) ×6 IMPLANT
PACK PERIPHERAL VASCULAR (CUSTOM PROCEDURE TRAY) ×3 IMPLANT
PAD ARMBOARD 7.5X6 YLW CONV (MISCELLANEOUS) ×6 IMPLANT
PADDING CAST COTTON 6X4 STRL (CAST SUPPLIES) IMPLANT
PATCH HEMASHIELD FINESS CARDIO (Vascular Products) ×2 IMPLANT
SET COLLECT BLD 21X3/4 12 (NEEDLE) IMPLANT
SPONGE INTESTINAL PEANUT (DISPOSABLE) ×3 IMPLANT
SPONGE LAP 18X18 X RAY DECT (DISPOSABLE) ×6 IMPLANT
STAPLER VISISTAT 35W (STAPLE) IMPLANT
STOPCOCK 4 WAY LG BORE MALE ST (IV SETS) IMPLANT
SUT PROLENE 5 0 C 1 24 (SUTURE) ×3 IMPLANT
SUT PROLENE 6 0 BV (SUTURE) IMPLANT
SUT PROLENE 6 0 C 1 24 (SUTURE) ×6 IMPLANT
SUT PROLENE 6 0 C 1 30 (SUTURE) ×3 IMPLANT
SUT PROLENE 6 0 CC (SUTURE) ×12 IMPLANT
SUT PROLENE 7 0 BV 1 (SUTURE) IMPLANT
SUT PROLENE 7 0 BV1 MDA (SUTURE) IMPLANT
SUT SILK 2 0 SH (SUTURE) ×3 IMPLANT
SUT SILK 3 0 (SUTURE)
SUT SILK 3-0 18XBRD TIE 12 (SUTURE) IMPLANT
SUT VIC AB 2-0 CTX 36 (SUTURE) ×6 IMPLANT
SUT VIC AB 3-0 SH 27 (SUTURE) ×2
SUT VIC AB 3-0 SH 27X BRD (SUTURE) ×4 IMPLANT
SUT VICRYL 4-0 PS2 18IN ABS (SUTURE) ×3 IMPLANT
SYR TB 1ML LUER SLIP (SYRINGE) ×3 IMPLANT
TOWEL OR 17X24 6PK STRL BLUE (TOWEL DISPOSABLE) ×9 IMPLANT
TOWEL OR 17X26 10 PK STRL BLUE (TOWEL DISPOSABLE) ×9 IMPLANT
TRAY FOLEY CATH 14FRSI W/METER (CATHETERS) ×3 IMPLANT
TUBING EXTENTION W/L.L. (IV SETS) IMPLANT
UNDERPAD 30X30 INCONTINENT (UNDERPADS AND DIAPERS) ×3 IMPLANT
WATER STERILE IRR 1000ML POUR (IV SOLUTION) ×3 IMPLANT

## 2012-09-15 NOTE — Anesthesia Preprocedure Evaluation (Signed)
Anesthesia Evaluation  Patient identified by MRN, date of birth, ID band Patient awake    Reviewed: Allergy & Precautions, H&P , NPO status , Patient's Chart, lab work & pertinent test results  Airway Mallampati: I TM Distance: >3 FB Neck ROM: full    Dental   Pulmonary          Cardiovascular hypertension, + Peripheral Vascular Disease + dysrhythmias Atrial Fibrillation Rhythm:irregular Rate:Normal     Neuro/Psych  Neuromuscular disease    GI/Hepatic   Endo/Other  Hypothyroidism   Renal/GU      Musculoskeletal   Abdominal   Peds  Hematology  (+) Blood dyscrasia, ,   Anesthesia Other Findings   Reproductive/Obstetrics                           Anesthesia Physical Anesthesia Plan  ASA: III  Anesthesia Plan: General   Post-op Pain Management:    Induction: Intravenous  Airway Management Planned: Oral ETT  Additional Equipment:   Intra-op Plan:   Post-operative Plan: Extubation in OR  Informed Consent: I have reviewed the patients History and Physical, chart, labs and discussed the procedure including the risks, benefits and alternatives for the proposed anesthesia with the patient or authorized representative who has indicated his/her understanding and acceptance.     Plan Discussed with: CRNA, Anesthesiologist and Surgeon  Anesthesia Plan Comments:         Anesthesia Quick Evaluation

## 2012-09-15 NOTE — Interval H&P Note (Signed)
History and Physical Interval Note:  09/15/2012 11:04 AM  Charlotte [name]  has presented today for surgery, with the diagnosis of Right Iliac Occlusive Disease  The various methods of treatment have been discussed with the patient and family. After consideration of risks, benefits and other options for treatment, the patient has consented to  Procedure(s) with comments: ENDARTERECTOMY FEMORAL (Right) - Left to Right Femoral Femoral Artery Bypass BYPASS GRAFT FEMORAL-POPLITEAL ARTERY (Right) as a surgical intervention .  The patient's history has been reviewed, patient examined, no change in status, stable for surgery.  I have reviewed the patient's chart and labs.  Questions were answered to the patient's satisfaction.     Tinnie Gens

## 2012-09-15 NOTE — Preoperative (Signed)
Beta Blockers   Reason not to administer Beta Blockers:Not Applicable, took this AM

## 2012-09-15 NOTE — Anesthesia Postprocedure Evaluation (Signed)
  Anesthesia Post-op Note  Patient: Charlotte [name]  Procedure(s) Performed: Procedure(s): ENDARTERECTOMY FEMORAL (Right) BYPASS GRAFT FEMORAL-POPLITEAL ARTERY (Right) PATCH ANGIOPLASTY (Right)  Patient Location: PACU  Anesthesia Type:General  Level of Consciousness: awake, oriented, sedated and patient cooperative  Airway and Oxygen Therapy: Patient Spontanous Breathing  Post-op Pain: mild  Post-op Assessment: Post-op Vital signs reviewed, Patient's Cardiovascular Status Stable, Respiratory Function Stable, Patent Airway, No signs of Nausea or vomiting and Pain level controlled  Post-op Vital Signs: stable  Complications: No apparent anesthesia complications

## 2012-09-15 NOTE — Transfer of Care (Signed)
Immediate Anesthesia Transfer of Care Note  Patient: Charlotte [name]  Procedure(s) Performed: Procedure(s): ENDARTERECTOMY FEMORAL (Right) BYPASS GRAFT FEMORAL-POPLITEAL ARTERY (Right) PATCH ANGIOPLASTY (Right)  Patient Location: PACU  Anesthesia Type:General  Level of Consciousness: patient cooperative, lethargic and responds to stimulation  Airway & Oxygen Therapy: Patient Spontanous Breathing and Patient connected to face mask oxygen  Post-op Assessment: Report given to PACU RN and Post -op Vital signs reviewed and stable  Post vital signs: Reviewed and stable  Complications: No apparent anesthesia complications

## 2012-09-15 NOTE — Op Note (Signed)
OPERATIVE REPORT  Date of Surgery: 09/14/2012 - 09/15/2012  Surgeon: Tinnie Gens, MD  Assistant: Johnette Abraham  Pre-op Diagnosis: Occlusion right common femoral and profunda femoris artery and superficial femoral artery with severe claudication right leg  Post-op Diagnosis: Same Procedure: Procedure(s): Right femoral endarterectomy with Dacron patch angioplasty and profundoplasty Right femoral popliteal bypass graft using 6 mm Gore-Tex (above-knee)  Anesthesia: General  EBL: [id number]  Complications: None  The patient was taken to the operating room placed in the supine position at which time satisfactory general endotracheal anesthesia was administered. The right leg was prepped with Betadine scrub and solution draped in routine sterile manner. Longitudinal incision was made in the right inguinal area common superficial and profunda femoris arteries dissected free. There was total occlusion of the common femoral, superficial femoral, and the origin of the profunda. External iliac was exposed anything malignant or it had an excellent pulse was normal in appearance. Following this the above-knee popliteal artery was exposed through a medial incision in the distal aspect of the right thigh. It was diffusely diseased proximally but was soft distally. Care was taken not to injure the saphenous vein. Subfascial anatomic tunnel was created a 6 mm standard Gore-Tex graft delivered through the tunnel and the patient heparinized. Femoral vessels were occluded with vascular clamps of the external iliac occluded beneath the inguinal ligament longitudinal opening made in the common femoral artery extended up to the external iliac and down through the occluded area into the profunda. Endarterectomy was performed. Eversion endarterectomy of the profunda was performed with a long tapered plaque being removed Backbleeding. A large lateral branch was also preserved. After all loose debris was carefully removed  a Dacron patch was fashioned and sewn in place with 6-0 Prolene. Following completion of this vessels were opened and attention turned to the popliteal artery which is occluded proximally and distally opened the 15 blade extended with Potts scissors. It was a good vessel. Gore-Tex was spatulated and anastomosed end-to-side with 6-0 Prolene. Following this attention return to the groin where vessels reoccluded and a Dacron patch which was placed was opened longitudinally an oval opening created and the Gore-Tex spatulated and anastomosed end to side with 6-0 Prolene. Clamps released there was an excellent pulse in the vessels and excellent Doppler flow in the dorsalis pedis with a palpable dorsalis pedis pulse. Protamine was given to reverse the heparin following adequate hemostasis the wounds irrigated with saline closed in layers with Vicryl in a subcuticular fashion with Dermabond patient taken to recovery in stable condition  Procedure Details:   Tinnie Gens, MD 09/15/2012 2:15 PM

## 2012-09-16 ENCOUNTER — Encounter (HOSPITAL_COMMUNITY): Payer: Self-pay | Admitting: Family Medicine

## 2012-09-16 DIAGNOSIS — Z48812 Encounter for surgical aftercare following surgery on the circulatory system: Secondary | ICD-10-CM | POA: Diagnosis not present

## 2012-09-16 DIAGNOSIS — G609 Hereditary and idiopathic neuropathy, unspecified: Secondary | ICD-10-CM | POA: Diagnosis present

## 2012-09-16 DIAGNOSIS — I872 Venous insufficiency (chronic) (peripheral): Secondary | ICD-10-CM | POA: Diagnosis present

## 2012-09-16 DIAGNOSIS — I70219 Atherosclerosis of native arteries of extremities with intermittent claudication, unspecified extremity: Secondary | ICD-10-CM | POA: Diagnosis present

## 2012-09-16 DIAGNOSIS — D45 Polycythemia vera: Secondary | ICD-10-CM | POA: Diagnosis present

## 2012-09-16 DIAGNOSIS — I4891 Unspecified atrial fibrillation: Secondary | ICD-10-CM | POA: Diagnosis present

## 2012-09-16 DIAGNOSIS — I10 Essential (primary) hypertension: Secondary | ICD-10-CM | POA: Diagnosis present

## 2012-09-16 DIAGNOSIS — M79609 Pain in unspecified limb: Secondary | ICD-10-CM | POA: Diagnosis not present

## 2012-09-16 DIAGNOSIS — Z833 Family history of diabetes mellitus: Secondary | ICD-10-CM | POA: Diagnosis not present

## 2012-09-16 DIAGNOSIS — H9190 Unspecified hearing loss, unspecified ear: Secondary | ICD-10-CM | POA: Diagnosis present

## 2012-09-16 DIAGNOSIS — I82509 Chronic embolism and thrombosis of unspecified deep veins of unspecified lower extremity: Secondary | ICD-10-CM | POA: Diagnosis present

## 2012-09-16 LAB — BASIC METABOLIC PANEL
BUN: 12 mg/dL (ref 6–23)
CO2: 24 mEq/L (ref 19–32)
Calcium: 8.7 mg/dL (ref 8.4–10.5)
Creatinine, Ser: 0.66 mg/dL (ref 0.50–1.10)
GFR calc non Af Amer: 88 mL/min — ABNORMAL LOW (ref >90)
Glucose, Bld: 96 mg/dL (ref 70–99)

## 2012-09-16 LAB — CBC
Hemoglobin: 12.7 g/dL (ref 12.0–15.0)
MCH: 40.4 pg — ABNORMAL HIGH (ref 26.0–34.0)
MCHC: 34 g/dL (ref 30.0–36.0)
MCV: 119.1 fL — ABNORMAL HIGH (ref 78.0–100.0)
RBC: 3.14 MIL/uL — ABNORMAL LOW (ref 3.87–5.11)

## 2012-09-16 MED ORDER — WARFARIN SODIUM 7.5 MG PO TABS
7.5000 mg | ORAL_TABLET | Freq: Once | ORAL | Status: AC
  Administered 2012-09-16: 7.5 mg via ORAL
  Filled 2012-09-16: qty 1

## 2012-09-16 MED ORDER — WARFARIN - PHARMACIST DOSING INPATIENT
Freq: Every day | Status: DC
  Administered 2012-09-16 – 2012-09-17 (×2)

## 2012-09-16 NOTE — Progress Notes (Addendum)
ANTICOAGULATION CONSULT NOTE - Follow Up Consult  Pharmacy Consult for coumadin Indication: atrial fibrillation, Chronic right DVT-right lower extremity   No Known Allergies  Patient Measurements: Height: 6' (182.9 cm) Weight: 184 lb (83.462 kg) IBW/kg (Calculated) : 73.1  Vital Signs: Temp: 97.3 F (36.3 C) (02/13 0700) Temp src: Oral (02/13 0800) BP: 121/67 mmHg (02/13 0800) Pulse Rate: 95 (02/13 0533)  Labs:  Recent Labs  09/14/12 0600  09/14/12 0607 09/14/12 1746 09/15/12 0612 09/16/12 0320  HGB  --   < > 17.0*  --  14.3 12.7  HCT  --   --  50.0*  --  41.4 37.4  PLT  --   --   --   --  178 170  APTT 41*  --   --  38*  --   --   LABPROT 15.9*  --   --   --  16.3* 15.7*  INR 1.30  --   --   --  1.34 1.28  CREATININE  --   --  1.40*  --  0.65 0.66  < > = values in this interval not displayed.  Estimated Creatinine Clearance: 76.6 ml/min (by C-G formula based on Cr of 0.66).   Medications:  Scheduled:  . aspirin EC  81 mg Oral Daily  . [COMPLETED] cefUROXime (ZINACEF)  IV  1.5 g Intravenous Q12H  . diltiazem  180 mg Oral Daily  . docusate sodium  100 mg Oral Daily  . gabapentin  100 mg Oral TID  . hydroxyurea  1,000 mg Oral QHS  . hydroxyurea  500 mg Oral Daily  . levothyroxine  25 mcg Oral Q breakfast  . metoprolol succinate  100 mg Oral BID  . [EXPIRED] ondansetron      . [EXPIRED] ondansetron      . pantoprazole  40 mg Oral Daily    Assessment: 72 yo female found with chronic right DVT-right lower extremity now s/p right femoral endarterectomy with profundoplasty and right femoral-popliteal bypass graft (POD #1). Patient on coumadin PTA for afib and to continue while admitted. INR today is 1.28, Hg=12.7 and trend down noted.   Goal of Therapy:  INR 2-3 Monitor platelets by anticoagulation protocol: Yes   Plan:  -Coumadin 7.5mg  today -Daily PT/INR  Hildred Laser, Pharm D 09/16/2012 11:37 AM

## 2012-09-16 NOTE — Evaluation (Signed)
Occupational Therapy Evaluation Patient Details Name: Charlotte [name] MRN: [id number] DOB: 17-Nov-1940 Today's Date: 09/16/2012 Time: [id number] OT Time Calculation (min): 9 min  OT Assessment / Plan / Recommendation Clinical Impression  72 yo female admitted for p right femoral endarterectomy with Dacron patch angioplasty and profundoplasty and right femoral popliteal bypass graft.  Ot to follow acutely. Recommend HHOT for d/c planning.    OT Assessment  Patient needs continued OT Services    Follow Up Recommendations  Home health OT    Barriers to Discharge      Equipment Recommendations  None recommended by OT    Recommendations for Other Services    Frequency  Min 2X/week    Precautions / Restrictions Precautions Precautions: Fall   Pertinent Vitals/Pain Reports fatigue from being up most of the AM and recent change of rooms Pt decline OOB at this time and prefer to have lunch in bed watching snow    ADL  Eating/Feeding: Independent Where Assessed - Eating/Feeding: Bed level Grooming: Wash/dry hands;Wash/dry face;Independent Where Assessed - Grooming: Unsupported sitting Lower Body Dressing: Supervision/safety Where Assessed - Lower Body Dressing: Unsupported sit to stand Toilet Transfer: Minimal assistance Toilet Transfer Method: Sit to stand Toilet Transfer Equipment: Regular height toilet Equipment Used: Gait belt Transfers/Ambulation Related to ADLs: Pt completed sit<>Stand at EOB. Pt s/p tranfer from 3300 to 2025 at this time. Pt fatigued but agreeable to EOB task. Pt reports "ive been up in the chair all morning" ADL Comments: Pt is able to touch toes on bil LE demonstrating no need for AE> Pt will have assistance of husband for x1 week and then son. Pt's huband taking 1 week off to be at home. Pt completed bed mobility at mod I level. Pt progressing quickly    OT Diagnosis: Generalized weakness;Acute pain  OT Problem List: Decreased strength;Decreased activity  tolerance;Impaired balance (sitting and/or standing);Pain;Decreased safety awareness;Decreased knowledge of use of DME or AE;Decreased knowledge of precautions OT Treatment Interventions: Self-care/ADL training;DME and/or AE instruction;Therapeutic activities;Patient/family education;Balance training   OT Goals Acute Rehab OT Goals OT Goal Formulation: With patient Time For Goal Achievement: 09/30/12 Potential to Achieve Goals: Good ADL Goals Pt Will Transfer to Toilet: Independently;Ambulation;3-in-1 ADL Goal: Toilet Transfer - Progress: Goal set today Miscellaneous OT Goals Miscellaneous OT Goal #1: Pt will complete dynamic standing at sink level for 2 grooming task MOD I level  OT Goal: Miscellaneous Goal #1 - Progress: Goal set today Miscellaneous OT Goal #2: Pt will complete basic transfer supervision level with RW OT Goal: Miscellaneous Goal #2 - Progress: Goal set today  Visit Information  Last OT Received On: 09/16/12 Assistance Needed: +1    Subjective Data  Subjective: "they had me up in the chair all morning" Patient Stated Goal: to go home and to watch it snow today   Prior [place] Lives With: Spouse;Son Available Help at Discharge: Family;Available PRN/intermittently Type of Home: Mobile home Home Access: Stairs to enter Entrance Stairs-Number of Steps: 5 Entrance Stairs-Rails: Left;Right;Can reach both Home Layout: One level Bathroom Shower/Tub: Chiropodist: Standard Home Adaptive Equipment: Bedside commode/3-in-1;Walker - rolling;Straight cane Prior Function Level of Independence: Independent Able to Take Stairs?: Yes Driving: Yes Communication Communication: No difficulties;HOH Dominant Hand: Right         Vision/Perception Vision - History Baseline Vision: No visual deficits Patient Visual Report: No change from baseline   Cognition  Cognition Overall Cognitive Status: Appears within functional limits  for tasks assessed/performed  Arousal/Alertness: Awake/alert Orientation Level: Appears intact for tasks assessed Behavior During Session: [facility] for tasks performed    Extremity/Trunk Assessment Right Upper Extremity Assessment RUE ROM/Strength/Tone: Within functional levels RUE Sensation: WFL - Light Touch RUE Coordination: WFL - gross/fine motor Left Upper Extremity Assessment LUE ROM/Strength/Tone: Within functional levels LUE Sensation: WFL - Light Touch LUE Coordination: WFL - gross/fine motor  Trunk Assessment Trunk Assessment: Normal     Mobility Bed Mobility Bed Mobility: Supine to Sit;Sitting - Scoot to Edge of Bed;Sit to Supine Supine to Sit: 6: Modified independent (Device/Increase time);HOB flat Sitting - Scoot to Edge of Bed: 6: Modified independent (Device/Increase time) Sit to Supine: 6: Modified independent (Device/Increase time) Details for Bed Mobility Assistance: WFL for home Transfers Transfers: Sit to Stand;Stand to Sit Sit to Stand: 4: Min assist;With upper extremity assist;From bed Stand to Sit: 4: Min assist;With upper extremity assist;To bed Details for Transfer Assistance: Pt reports pain with weight bearing on Rt LE. Pt demonstrates incr independence carrying over education from PT [place]: Yes Static Standing Balance Static Standing - Balance Support: Left upper extremity supported;During functional activity Static Standing - Level of Assistance: 4: Min assist    End of Session OT - End of Session Activity Tolerance: Patient tolerated treatment well Patient left: in bed;with call bell/phone within reach Nurse Communication: Mobility status;Precautions  GO     Veneda Melter 09/16/2012, 1:45 PM Pager: [phone]

## 2012-09-16 NOTE — Progress Notes (Signed)
VASCULAR LAB PRELIMINARY  ARTERIAL  ABI completed:    RIGHT    LEFT    PRESSURE WAVEFORM  PRESSURE WAVEFORM  BRACHIAL 113 Tri BRACHIAL 114 Tri  DP 121 Bi DP 121 Mono   AT   AT    PT 124 Mono  PT 120 Tri   PER   PER    GREAT TOE  NA GREAT TOE  NA    RIGHT LEFT  ABI 1.09 1.06     Landry Mellow, RDMS, RVT  09/16/2012, 3:49 PM

## 2012-09-16 NOTE — Evaluation (Signed)
Physical Therapy Evaluation Patient Details Name: Charlotte [name] MRN: [id number] DOB: [birth year] Today's Date: 09/16/2012 Time: 1150-1208 PT Time Calculation (min): 18 min  PT Assessment / Plan / Recommendation Clinical Impression  Patient is a 72 y/o female s/p right femoral endarterectomy with Dacron patch angioplasty and profundoplasty and right femoral popliteal bypass graft.  She presents with decreased balance, decreased strength,acute pain, decreased activity tolerance all limiting independence with mobility and will benefit from skilled PT in the acute setting to maximize independence and allow d/c home with family assist.    PT Assessment  Patient needs continued PT services    Follow Up Recommendations  Home health PT          Equipment Recommendations  None recommended by PT       Frequency Min 3X/week    Precautions / Restrictions Precautions Precautions: Fall   Pertinent Vitals/Pain 5-6/10 right knee and groin      Mobility  Bed Mobility Details for Bed Mobility Assistance: patient out of bed in recliner Transfers Transfers: Sit to Stand;Stand to Sit Sit to Stand: 3: Mod assist;With upper extremity assist;From chair/3-in-1;With armrests Stand to Sit: 4: Min assist;To chair/3-in-1;With armrests;With upper extremity assist Details for Transfer Assistance: increased pain with movement especially transitions, kept right leg out in front independently Ambulation/Gait Ambulation/Gait Assistance: 4: Min assist Ambulation Distance (Feet): 120 Feet Assistive device: Rolling walker Ambulation/Gait Assistance Details: stopped about 4-5 times due to burning pain right knee (comes and goes), cues for safety Gait Pattern: Step-to pattern;Decreased hip/knee flexion - right;Decreased step length - left;Decreased stride length;Trunk flexed    Exercises General Exercises - Lower Extremity Ankle Circles/Pumps: AROM;Both;5 reps;Seated Heel Slides: AROM;Right;Other reps  (comment);Seated (3 reps)   PT Diagnosis: Difficulty walking;Acute pain;Generalized weakness  PT Problem List: Decreased strength;Decreased range of motion;Decreased activity tolerance;Decreased balance;Decreased mobility;Pain PT Treatment Interventions: Gait training;Stair training;DME instruction;Therapeutic activities;Therapeutic exercise;Functional mobility training;Patient/family education;Balance training   PT Goals Acute Rehab PT Goals PT Goal Formulation: With patient Time For Goal Achievement: 09/23/12 Potential to Achieve Goals: Good Pt will go Supine/Side to Sit: with min assist;with HOB 0 degrees PT Goal: Supine/Side to Sit - Progress: Goal set today Pt will go Sit to Supine/Side: with min assist PT Goal: Sit to Supine/Side - Progress: Goal set today Pt will go Sit to Stand: with supervision;with upper extremity assist PT Goal: Sit to Stand - Progress: Goal set today Pt will go Stand to Sit: with supervision;with upper extremity assist PT Goal: Stand to Sit - Progress: Goal set today Pt will Stand: with modified independence;with unilateral upper extremity support;3 - 5 min PT Goal: Stand - Progress: Goal set today Pt will Ambulate: >150 feet;with supervision;with rolling walker PT Goal: Ambulate - Progress: Goal set today Pt will Go Up / Down Stairs: 3-5 stairs;with rail(s);with min assist PT Goal: Up/Down Stairs - Progress: Goal set today Pt will Perform Home Exercise Program: with supervision, verbal cues required/provided PT Goal: Perform Home Exercise Program - Progress: Goal set today  Visit Information  Last PT Received On: 09/16/12 Assistance Needed: +1    Subjective Data  Subjective: I'm surprised I could do this well.  Really hurt getting out of bed this morning. Patient Stated Goal: To return to independent   Prior [place] Lives With: Spouse;Son Available Help at Discharge: Family;Available PRN/intermittently Type of Home: Mobile  home Home Access: Stairs to enter Entrance Stairs-Number of Steps: 5 Entrance Stairs-Rails: Left;Right;Can reach both Home Layout: One level Home Adaptive Equipment: Bedside  commode/3-in-1;Walker - rolling;Straight cane Prior Function Level of Independence: Independent Able to Take Stairs?: Yes Driving: Yes Communication Communication: No difficulties;HOH    Cognition  Cognition Overall Cognitive Status: Appears within functional limits for tasks assessed/performed Arousal/Alertness: Awake/alert Orientation Level: Appears intact for tasks assessed Behavior During Session: [facility] for tasks performed    Extremity/Trunk Assessment Right Lower Extremity Assessment RLE ROM/Strength/Tone: Deficits RLE ROM/Strength/Tone Deficits: AROM knee flexion approx 70 with pain, hip flexion 100; strength hip flexion 3/5, knee extension 3+/5, ankle dorsiflexion 4/5 RLE Sensation: Deficits;History of peripheral neuropathy RLE Sensation Deficits: decreased to light touch to mid shin (reports some better since surgery) Left Lower Extremity Assessment LLE ROM/Strength/Tone: [facility] for tasks assessed LLE Sensation: Deficits LLE Sensation Deficits: decreased to light touch on foot   Balance Balance Balance Assessed: Yes Static Standing Balance Static Standing - Balance Support: Bilateral upper extremity supported Static Standing - Level of Assistance: 4: Min assist Static Standing - Comment/# of Minutes: due to pain pt unable to focus for safe balance independently  End of Session PT - End of Session Equipment Utilized During Treatment: Gait belt Activity Tolerance: Patient limited by pain Patient left: in chair;with call bell/phone within reach  GP Functional Assessment Tool Used: Clinical Judgement Functional Limitation: Mobility: Walking and moving around Mobility: Walking and Moving Around Current Status [id number]): At least 20 percent but less than 40 percent impaired, limited or restricted Mobility:  Walking and Moving Around Goal Status [phone]): At least 1 percent but less than 20 percent impaired, limited or restricted   [facility] 09/16/2012, 12:24 PM Magda Kiel, [place] 09/16/2012

## 2012-09-16 NOTE — Progress Notes (Signed)
ANTICOAGULATION CONSULT NOTE - Initial Consult  Pharmacy Consult for Coumadin Indication: h/o Afib  No Known Allergies  Patient Measurements: Height: 6' (182.9 cm) Weight: 184 lb (83.462 kg) IBW/kg (Calculated) : 73.1  Vital Signs: Temp: 97.9 F (36.6 C) (02/12 1630) BP: 146/90 mmHg (02/12 2015) Pulse Rate: 94 (02/12 2015)  Labs:  Recent Labs  09/14/12 0600 09/14/12 0607 09/14/12 1746 09/15/12 0612  HGB  --  17.0*  --  14.3  HCT  --  50.0*  --  41.4  PLT  --   --   --  178  APTT 41*  --  38*  --   LABPROT 15.9*  --   --  16.3*  INR 1.30  --   --  1.34  CREATININE  --  1.40*  --  0.65    Estimated Creatinine Clearance: 76.6 ml/min (by C-G formula based on Cr of 0.65).   Medical History: Past Medical History  Diagnosis Date  . Hypertension   . Arthritis   . Varicose veins   . Deaf   . Elevated WBC count     and platelets  . Thyroid disease   . Irregular heart rate   . Polycythemia vera   . Peripheral neuropathy     feet  . Polycythemia vera 10/01/2011  . Atrial fibrillation, chronic   . 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 aspirin EC 81 MG tablet Take 81 mg by mouth daily.      Marland Kitchen diltiazem (DILACOR XR) 180 MG 24 hr capsule Take 180 mg by mouth daily.      Marland Kitchen gabapentin (NEURONTIN) 100 MG capsule Take 100 mg by mouth 3 (three) times daily.        . hydroxyurea (HYDREA) 500 MG capsule Take 500-1,000 mg by mouth 2 (two) times daily. 1 capsule in morning and 2 capsules in the evening      . levothyroxine (SYNTHROID, LEVOTHROID) 25 MCG tablet Take 25 mcg by mouth daily.        . metoprolol (TOPROL-XL) 100 MG 24 hr tablet Take 100 mg by mouth 2 (two) times daily.        Marland Kitchen warfarin (COUMADIN) 5 MG tablet Take 5 mg by mouth daily.         Assessment: 72 yo female s/p femoral endarterectomy, h/o Afib, to resume anticoagulation  Goal of Therapy:  INR 2-3 Monitor  platelets by anticoagulation protocol: Yes   Plan:  F/U daily INR  Trenton Verne, Bronson Curb 09/16/2012,12:21 AM

## 2012-09-16 NOTE — Progress Notes (Signed)
Patient ID: Charlotte [name], female   DOB: [birth year], 72 y.o.   MRN: [id number] Vascular Surgery Progress Note  Subjective: One day post right femoral endarterectomy with profundoplasty and right femoral-popliteal bypass graft. Patient with no complaints today other than mild discomfort right leg. Hungry. Objective:  Filed Vitals:   09/16/12 0533  BP: 133/80  Pulse: 95  Temp: 98.2 F (36.8 C)  Resp: 17    General well-developed well-nourished female in no apparent stress alert and oriented x3 Lungs no rhonchi or wheezing Right lower extremity with dry dressings no evidence of hematoma a 3+ dorsalis pedis pulse palpable and well perfused right foot   Labs:  Recent Labs Lab 09/14/12 0607 09/15/12 0612 09/16/12 0320  CREATININE 1.40* 0.65 0.66    Recent Labs Lab 09/14/12 0607 09/15/12 0612 09/16/12 0320  NA 141 140 135  K 4.1 3.6 3.9  CL 109 109 102  CO2  --  22 24  BUN 20 16 12   CREATININE 1.40* 0.65 0.66  GLUCOSE 88 95 96  CALCIUM  --  8.9 8.7    Recent Labs Lab 09/14/12 0607 09/15/12 0612 09/16/12 0320  WBC  --  4.6 5.1  HGB 17.0* 14.3 12.7  HCT 50.0* 41.4 37.4  PLT  --  178 170    Recent Labs Lab 09/14/12 0600 09/15/12 0612 09/16/12 0320  INR 1.30 1.34 1.28    I/O last 3 completed shifts: In: [id number] [P.O.:240; I.V.:4500; IV Piggyback:50] Out: 1150 [Urine:1150]  Imaging: Dg Chest 2 View  09/15/2012  *RADIOLOGY REPORT*  Clinical Data: Preop.  CHEST - 2 VIEW  Comparison: 12/16/2011.  Findings: Trachea is midline.  Heart size stable.  Biapical pleural parenchymal scarring.  Lungs are hyperinflated with pleural parenchymal scarring at the right costophrenic angle.  Minimal left basilar atelectasis or scarring and probable pleural thickening.  IMPRESSION: Probable bibasilar pleural parenchymal scarring.  Difficult to exclude subsegmental atelectasis in the left lung base.   Original Report Authenticated By: Lorin Picket, M.D.      Assessment/Plan:  POD #1  LOS: 2 days  s/p Procedure(s): ENDARTERECTOMY FEMORAL BYPASS GRAFT FEMORAL-POPLITEAL ARTERY PATCH ANGIOPLASTY-left femoral artery  #1 out of bed today in chair #2 DC IV and Foley catheter-done #3 begin to ambulate short distances #4 transfer to 2000 today #5 resume Coumadin therapy-per pharmacy  Doing well-DC'd home in next few days     Tinnie Gens, MD 09/16/2012 8:33 AM

## 2012-09-17 ENCOUNTER — Other Ambulatory Visit (HOSPITAL_COMMUNITY): Payer: Medicare Other

## 2012-09-17 ENCOUNTER — Telehealth: Payer: Self-pay | Admitting: Vascular Surgery

## 2012-09-17 LAB — TYPE AND SCREEN
Antibody Screen: NEGATIVE
Unit division: 0

## 2012-09-17 MED ORDER — WARFARIN SODIUM 7.5 MG PO TABS
7.5000 mg | ORAL_TABLET | Freq: Once | ORAL | Status: AC
  Administered 2012-09-17: 7.5 mg via ORAL
  Filled 2012-09-17: qty 1

## 2012-09-17 NOTE — Telephone Encounter (Signed)
Message copied by Berniece Salines on Fri Sep 17, 2012  2:18 PM ------      Message from: Alfonso Patten      Created: Fri Sep 17, 2012  1:10 PM                   ----- Message -----         From: Richrd Prime, PA         Sent: 09/17/2012  12:44 PM           To: Alfonso Patten, RN            4 weeks fem-pop F/U - lawson ------

## 2012-09-17 NOTE — Progress Notes (Signed)
ANTICOAGULATION CONSULT NOTE - Follow Up Consult  Pharmacy Consult for coumadin Indication: atrial fibrillation, Chronic right DVT-right lower extremity   No Known Allergies  Patient Measurements: Height: 6' (182.9 cm) Weight: 184 lb (83.462 kg) IBW/kg (Calculated) : 73.1  Vital Signs: Temp: 97.1 F (36.2 C) (02/14 0506) Temp src: Oral (02/14 0506) BP: 136/80 mmHg (02/14 0506) Pulse Rate: 89 (02/14 0506)  Labs:  Recent Labs  09/14/12 1746 09/15/12 0612 09/16/12 0320 09/17/12 0445  HGB  --  14.3 12.7  --   HCT  --  41.4 37.4  --   PLT  --  178 170  --   APTT 38*  --   --   --   LABPROT  --  16.3* 15.7* 15.9*  INR  --  1.34 1.28 1.30  CREATININE  --  0.65 0.66  --     Estimated Creatinine Clearance: 76.6 ml/min (by C-G formula based on Cr of 0.66).   Medications:  Scheduled:  . aspirin EC  81 mg Oral Daily  . [COMPLETED] cefUROXime (ZINACEF)  IV  1.5 g Intravenous Q12H  . diltiazem  180 mg Oral Daily  . docusate sodium  100 mg Oral Daily  . gabapentin  100 mg Oral TID  . hydroxyurea  1,000 mg Oral QHS  . hydroxyurea  500 mg Oral Daily  . levothyroxine  25 mcg Oral Q breakfast  . metoprolol succinate  100 mg Oral BID  . pantoprazole  40 mg Oral Daily  . [COMPLETED] warfarin  7.5 mg Oral ONCE-1800  . Warfarin - Pharmacist Dosing Inpatient   Does not apply q1800    Assessment: 72 yo female found with chronic DVT-right lower extremity now s/p right femoral endarterectomy with profundoplasty and right femoral-popliteal bypass graft (POD #2). Patient on coumadin PTA for afib and to continue while admitted. INR today is 1.3.    Goal of Therapy:  INR 2-3   Plan:  -Coumadin 7.5mg  today -Daily PT/INR  [employer], Pharm.D., BCPS Clinical Pharmacist Pager [phone] 09/17/2012 8:35 AM

## 2012-09-17 NOTE — Progress Notes (Addendum)
[facility] [place]  Progress Note Bypass Surgery  Date of Surgery: 09/14/2012 - 09/15/2012  Procedure(s): ENDARTERECTOMY FEMORAL BYPASS GRAFT FEMORAL-POPLITEAL ARTERY PATCH ANGIOPLASTY Surgeon: Surgeon(s): Mal Misty, MD  2 Days Post-Op  History of Present Illness  Charlotte [name] is a 72 y.o. female who is S/P Procedure(s): ENDARTERECTOMY FEMORAL BYPASS GRAFT FEMORAL-POPLITEAL ARTERY PATCH ANGIOPLASTY right.  The patient's pre-op symptoms of pain are Improved . Patients pain is well controlled.    VASC. LAB Studies: Pending  Imaging: No results found.  Significant Diagnostic Studies: CBC Lab Results  Component Value Date   WBC 5.1 09/16/2012   HGB 12.7 09/16/2012   HCT 37.4 09/16/2012   MCV 119.1* 09/16/2012   PLT 170 09/16/2012    BMET     Component Value Date/Time   NA 135 09/16/2012 0320   K 3.9 09/16/2012 0320   CL 102 09/16/2012 0320   CO2 24 09/16/2012 0320   GLUCOSE 96 09/16/2012 0320   BUN 12 09/16/2012 0320   CREATININE 0.66 09/16/2012 0320   CALCIUM 8.7 09/16/2012 0320   GFRNONAA 88* 09/16/2012 0320   GFRAA >90 09/16/2012 0320    COAG Lab Results  Component Value Date   INR 1.30 09/17/2012   INR 1.28 09/16/2012   INR 1.34 09/15/2012   No results found for this basename: PTT    Physical Examination  BP Readings from Last 3 Encounters:  09/17/12 136/80  09/17/12 136/80  09/17/12 136/80   Temp Readings from Last 3 Encounters:  09/17/12 97.1 F (36.2 C) Oral  09/17/12 97.1 F (36.2 C) Oral  09/17/12 97.1 F (36.2 C) Oral   SpO2 Readings from Last 3 Encounters:  09/17/12 97%  09/17/12 97%  09/17/12 97%   Pulse Readings from Last 3 Encounters:  09/17/12 89  09/17/12 89  09/17/12 89    Pt is A&O x 3 right lower extremity: Incision/s is/are clean,dry.intact, and  healing without hematoma, erythema or drainage Ecchymosis around the incisions Limb is warm; with good color palpable Dp right  foot   Assessment/Plan: Pt. Doing well Post-op pain is controlled Wounds are healing well PT/OT for ambulation Plan D/C  Laurence Slate [facility] [id number] 09/17/2012 10:05 AM       Agree with the above-assessment  3+ dorsalis pedis pulse palpable-wounds okay Beginning to ambulate  Possible home in a.m. Coumadin to be managed following discharge from hospital

## 2012-09-17 NOTE — Progress Notes (Signed)
Physical Therapy Treatment Patient Details Name: Charlotte [name] MRN: [id number] DOB: [birth year] Today's Date: 09/17/2012 Time: [id number] PT Time Calculation (min): 20 min  PT Assessment / Plan / Recommendation Comments on Treatment Session  Patient progressing with activity tolerance, distance with ambulation, and independence.  Feel will be ready for d/c tomorrow with HHPT to follow.    Follow Up Recommendations  Home health PT           Equipment Recommendations  None recommended by PT       Frequency Min 3X/week   Plan Discharge plan remains appropriate    Precautions / Restrictions Precautions Precautions: Fall   Pertinent Vitals/Pain 6/10 right LE with mobility    Mobility  Bed Mobility Supine to Sit: 6: Modified independent (Device/Increase time) Sitting - Scoot to Edge of Bed: 6: Modified independent (Device/Increase time) Sit to Supine: 6: Modified independent (Device/Increase time) Transfers Sit to Stand: 4: Min assist;From bed;With upper extremity assist Stand to Sit: 5: Supervision;With upper extremity assist;To bed Ambulation/Gait Ambulation/Gait Assistance: 5: Supervision;4: Min guard Ambulation Distance (Feet): 200 Feet Assistive device: Rolling walker Ambulation/Gait Assistance Details: Only two standing rests (one due to pain, and one due to conversation with PA) Gait Pattern: Step-through pattern;Antalgic;Decreased step length - left;Decreased stance time - right    Exercises General Exercises - Lower Extremity Ankle Circles/Pumps: AROM;Right;10 reps;Seated Long Arc Quad: AROM;Right;10 reps;Seated   PT Goals Acute Rehab PT Goals Pt will go Supine/Side to Sit: with HOB 0 degrees;with modified independence PT Goal: Supine/Side to Sit - Progress: Updated due to goal met Pt will go Sit to Supine/Side: with modified independence PT Goal: Sit to Supine/Side - Progress: Updated due to goal met Pt will go Sit to Stand: with modified independence PT  Goal: Sit to Stand - Progress: Updated due to goal met Pt will go Stand to Sit: with modified independence PT Goal: Stand to Sit - Progress: Updated due to goals met Pt will Stand: with modified independence;with unilateral upper extremity support;3 - 5 min PT Goal: Stand - Progress: Progressing toward goal Pt will Ambulate: >150 feet;with rolling walker;with modified independence PT Goal: Ambulate - Progress: Updated due to goal met Pt will Perform Home Exercise Program: with supervision, verbal cues required/provided PT Goal: Perform Home Exercise Program - Progress: Progressing toward goal  Visit Information  Last PT Received On: 09/17/12    Subjective Data  Subjective: Feels a little better, just got dressing off. Going home tomorrow.   Cognition  Cognition Overall Cognitive Status: Appears within functional limits for tasks assessed/performed Arousal/Alertness: Awake/alert Orientation Level: Appears intact for tasks assessed Behavior During Session: [facility] for tasks performed    Balance  Static Standing Balance Static Standing - Balance Support: Bilateral upper extremity supported Static Standing - Level of Assistance: 5: Stand by assistance Static Standing - Comment/# of Minutes: standing with walker at bedside  End of Session PT - End of Session Equipment Utilized During Treatment: Gait belt Activity Tolerance: Patient tolerated treatment well Patient left: in bed;with call bell/phone within reach   GP     [facility] 09/17/2012, 11:23 AM Magda Kiel, [place] 09/17/2012

## 2012-09-18 LAB — PROTIME-INR
INR: 1.55 — ABNORMAL HIGH (ref 0.00–1.49)
Prothrombin Time: 18.1 seconds — ABNORMAL HIGH (ref 11.6–15.2)

## 2012-09-18 MED ORDER — OXYCODONE-ACETAMINOPHEN 5-325 MG PO TABS: 1.0000 | ORAL_TABLET | ORAL | Status: DC | PRN

## 2012-09-18 NOTE — Progress Notes (Signed)
[facility] [place]  Progress Note Bypass Surgery  Date of Surgery: 09/14/2012 - 09/15/2012  Procedure(s): ENDARTERECTOMY FEMORAL BYPASS GRAFT FEMORAL-POPLITEAL ARTERY PATCH ANGIOPLASTY Surgeon: Surgeon(s): Mal Misty, MD  3 Days Post-Op  History of Present Illness  Charlotte [name] is a 72 y.o. female who is S/P Procedure(s): ENDARTERECTOMY FEMORAL BYPASS GRAFT FEMORAL-POPLITEAL ARTERY PATCH ANGIOPLASTY right.  The patient's pre-op symptoms of pain are Improved . Patients pain is well controlled.    VASC. LAB Studies: Right 1.09, Left 1.06   Imaging: No results found.  Significant Diagnostic Studies: CBC Lab Results  Component Value Date   WBC 5.1 09/16/2012   HGB 12.7 09/16/2012   HCT 37.4 09/16/2012   MCV 119.1* 09/16/2012   PLT 170 09/16/2012    BMET     Component Value Date/Time   NA 135 09/16/2012 0320   K 3.9 09/16/2012 0320   CL 102 09/16/2012 0320   CO2 24 09/16/2012 0320   GLUCOSE 96 09/16/2012 0320   BUN 12 09/16/2012 0320   CREATININE 0.66 09/16/2012 0320   CALCIUM 8.7 09/16/2012 0320   GFRNONAA 88* 09/16/2012 0320   GFRAA >90 09/16/2012 0320    COAG Lab Results  Component Value Date   INR 1.55* 09/18/2012   INR 1.30 09/17/2012   INR 1.28 09/16/2012   No results found for this basename: PTT    Physical Examination  BP Readings from Last 3 Encounters:  09/18/12 133/84  09/18/12 133/84  09/18/12 133/84   Temp Readings from Last 3 Encounters:  09/18/12 98.3 F (36.8 C) Oral  09/18/12 98.3 F (36.8 C) Oral  09/18/12 98.3 F (36.8 C) Oral   SpO2 Readings from Last 3 Encounters:  09/18/12 91%  09/18/12 91%  09/18/12 91%   Pulse Readings from Last 3 Encounters:  09/18/12 109  09/18/12 109  09/18/12 109    Pt is A&O x 3 right lower extremity: Incision/s is/are clean,dry.intact, and  healing without hematoma, erythema or drainage Limb is warm; with good color Palpable DP pulses  Assessment/Plan: Pt. Doing  well Post-op pain is controlled Wounds are healing well PT/OT for ambulation   Laurence Slate [facility] [id number] 09/18/2012 7:55 AM

## 2012-09-18 NOTE — Discharge Summary (Signed)
[facility] Discharge Summary   Patient ID:  Charlotte [name] MRN: [id number] DOB/AGE: [birth year] 72 y.o.  Admit date: 09/14/2012 Discharge date: 09/18/2012 Date of Surgery: 09/14/2012 - 09/15/2012 Surgeon: Juliann Mule): Mal Misty, MD  Admission Diagnosis: PVD Right Iliac Occlusive Disease  Discharge Diagnoses:  PVD Right Iliac Occlusive Disease  Secondary Diagnoses: Past Medical History  Diagnosis Date  . Hypertension   . Arthritis   . Varicose veins   . Deaf   . Elevated WBC count     and platelets  . Thyroid disease   . Irregular heart rate   . Polycythemia vera   . Peripheral neuropathy     feet  . Polycythemia vera 10/01/2011  . Atrial fibrillation, chronic   . 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 Tressie Stalker for evaluation of right leg pain. Patient has had a previous fracture of the right pelvis which required additional surgery on the anterior aspect of the right thigh about one year ago. She has no known history of DVT. She has been having pain in the right lower leg particularly with ambulation. This limits her considerably. She is able to walk about 50 feet. She denies rest pain. 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 She is on chronic Coumadin therapy for atrial fibrillation but has had no embolic events  She also has polycythemia vera. She has no history of nonhealing ulcers infection gangrene and rest pain in the right foot.     Hospital Course:  Charlotte [name] is a 72 y.o. female is S/P Right Procedure(s): ENDARTERECTOMY FEMORAL BYPASS GRAFT FEMORAL-POPLITEAL ARTERY PATCH ANGIOPLASTY Extubated: POD # 0 Post-op wounds healing well Pt. Ambulating, voiding and taking PO diet without difficulty. Pt pain controlled with PO pain meds. Labs as  below Complications:none  Consults:     Significant Diagnostic Studies: CBC Lab Results  Component Value Date   WBC 5.1 09/16/2012   HGB 12.7 09/16/2012   HCT 37.4 09/16/2012   MCV 119.1* 09/16/2012   PLT 170 09/16/2012    BMET    Component Value Date/Time   NA 135 09/16/2012 0320   K 3.9 09/16/2012 0320   CL 102 09/16/2012 0320   CO2 24 09/16/2012 0320   GLUCOSE 96 09/16/2012 0320   BUN 12 09/16/2012 0320   CREATININE 0.66 09/16/2012 0320   CALCIUM 8.7 09/16/2012 0320   GFRNONAA 88* 09/16/2012 0320   GFRAA >90 09/16/2012 0320   COAG Lab Results  Component Value Date   INR 1.55* 09/18/2012   INR 1.30 09/17/2012   INR 1.28 09/16/2012     Disposition:  Discharge to :Home Discharge Orders   Future Appointments Provider Department Dept Phone   10/19/2012 12:45 PM Mal Misty, MD [facility] [phone]   02/18/2013 1:00 PM Pieter Partridge, MD [facility] [phone]   Future Orders Complete By Expires     Call MD for:  redness, tenderness, or signs of infection (pain, swelling, bleeding, redness, odor or green/yellow discharge around incision site)  As directed     Call MD for:  severe or increased pain, loss or decreased feeling  in affected limb(s)  As directed     Call MD for:  temperature >100.5  As directed     Discharge patient  As directed     Comments:  Discharge pt to home    Driving Restrictions  As directed     Comments:      No driving for 3 weeks    Increase activity slowly  As directed     Comments:      Walk with assistance use walker or cane as needed    May shower   As directed     No dressing needed  As directed     Resume previous diet  As directed     may wash over wound with mild soap and water  As directed       @DISCHMEDS @ Verbal and written Discharge instructions given to the patient. Wound care per Discharge AVS Follow-up Information   Follow up with Tinnie Gens, MD In 4 weeks. (offfice will  arrange -sent)    Contact information:   [address] [place] [address] [phone]       Signed: Laurence Slate [facility] 09/18/2012, 7:58 AM

## 2012-09-19 NOTE — Progress Notes (Signed)
09/19/2012 1400 Notified [facility] of pt's dc home. Jonnie Finner RN CCM Case Mgmt phone [phone]

## 2012-09-20 ENCOUNTER — Encounter (HOSPITAL_COMMUNITY): Payer: Self-pay | Admitting: Vascular Surgery

## 2012-09-21 DIAGNOSIS — G609 Hereditary and idiopathic neuropathy, unspecified: Secondary | ICD-10-CM | POA: Diagnosis not present

## 2012-09-21 DIAGNOSIS — M159 Polyosteoarthritis, unspecified: Secondary | ICD-10-CM | POA: Diagnosis not present

## 2012-09-21 DIAGNOSIS — I10 Essential (primary) hypertension: Secondary | ICD-10-CM | POA: Diagnosis not present

## 2012-09-21 DIAGNOSIS — IMO0001 Reserved for inherently not codable concepts without codable children: Secondary | ICD-10-CM | POA: Diagnosis not present

## 2012-09-21 DIAGNOSIS — D45 Polycythemia vera: Secondary | ICD-10-CM | POA: Diagnosis not present

## 2012-09-21 DIAGNOSIS — Z48812 Encounter for surgical aftercare following surgery on the circulatory system: Secondary | ICD-10-CM | POA: Diagnosis not present

## 2012-09-23 DIAGNOSIS — D45 Polycythemia vera: Secondary | ICD-10-CM | POA: Diagnosis not present

## 2012-09-23 DIAGNOSIS — G609 Hereditary and idiopathic neuropathy, unspecified: Secondary | ICD-10-CM | POA: Diagnosis not present

## 2012-09-23 DIAGNOSIS — M159 Polyosteoarthritis, unspecified: Secondary | ICD-10-CM | POA: Diagnosis not present

## 2012-09-23 DIAGNOSIS — I10 Essential (primary) hypertension: Secondary | ICD-10-CM | POA: Diagnosis not present

## 2012-09-23 DIAGNOSIS — Z48812 Encounter for surgical aftercare following surgery on the circulatory system: Secondary | ICD-10-CM | POA: Diagnosis not present

## 2012-09-23 DIAGNOSIS — IMO0001 Reserved for inherently not codable concepts without codable children: Secondary | ICD-10-CM | POA: Diagnosis not present

## 2012-09-24 ENCOUNTER — Encounter: Payer: Self-pay | Admitting: Neurosurgery

## 2012-09-24 ENCOUNTER — Ambulatory Visit (INDEPENDENT_AMBULATORY_CARE_PROVIDER_SITE_OTHER): Payer: Medicare Other | Admitting: Neurosurgery

## 2012-09-24 VITALS — BP 126/88 | HR 58 | Temp 97.1°F | Resp 16 | Ht 72.0 in | Wt 190.0 lb

## 2012-09-24 DIAGNOSIS — I739 Peripheral vascular disease, unspecified: Secondary | ICD-10-CM

## 2012-09-24 DIAGNOSIS — M7989 Other specified soft tissue disorders: Secondary | ICD-10-CM

## 2012-09-24 NOTE — Progress Notes (Signed)
Subjective:     Patient ID: Charlotte [name], female   DOB: 16-May-1941, 72 y.o.   MRN: [id number]  HPI: 72 year old female patient of Dr. Kellie Simmering who is status post a right femoral endarterectomy with Dacron patch angioplasty and profundoplasty and right femoral popliteal bypass graft above the knee that was completed 09/15/2012. The patient called office asking to be seen due to to some bruising on the posterior aspect of of her leg and was brought in for evaluation. The patient denies any other difficulties.   Review of Systems: 12 point review of systems is notable for the difficulties described above otherwise unremarkable     Objective:   Physical Exam: Afebrile, vital signs are stable, surgical wounds are healing well, Dermabond is still in place, there is some bruising with mild hematoma on the posterior aspect of the right leg. Dr. Bridgett Larsson explained to the patient as well as I that this was blood that was pooling due to the space created during the procedure itself.     Assessment:     Mild hematoma with surgical bruising posterior aspect right leg distal incision.    Plan:     The patient will followup with Dr. Kellie Simmering as scheduled in March, her questions were encouraged and answered, she is in agreement with this plan.  Beatris Ship ANP  Clinic M.D.: Bridgett Larsson

## 2012-09-27 DIAGNOSIS — I10 Essential (primary) hypertension: Secondary | ICD-10-CM | POA: Diagnosis not present

## 2012-09-27 DIAGNOSIS — D45 Polycythemia vera: Secondary | ICD-10-CM | POA: Diagnosis not present

## 2012-09-27 DIAGNOSIS — IMO0001 Reserved for inherently not codable concepts without codable children: Secondary | ICD-10-CM | POA: Diagnosis not present

## 2012-09-27 DIAGNOSIS — Z48812 Encounter for surgical aftercare following surgery on the circulatory system: Secondary | ICD-10-CM | POA: Diagnosis not present

## 2012-09-27 DIAGNOSIS — M159 Polyosteoarthritis, unspecified: Secondary | ICD-10-CM | POA: Diagnosis not present

## 2012-09-27 DIAGNOSIS — G609 Hereditary and idiopathic neuropathy, unspecified: Secondary | ICD-10-CM | POA: Diagnosis not present

## 2012-09-30 DIAGNOSIS — G609 Hereditary and idiopathic neuropathy, unspecified: Secondary | ICD-10-CM | POA: Diagnosis not present

## 2012-09-30 DIAGNOSIS — I10 Essential (primary) hypertension: Secondary | ICD-10-CM | POA: Diagnosis not present

## 2012-09-30 DIAGNOSIS — D45 Polycythemia vera: Secondary | ICD-10-CM | POA: Diagnosis not present

## 2012-09-30 DIAGNOSIS — IMO0001 Reserved for inherently not codable concepts without codable children: Secondary | ICD-10-CM | POA: Diagnosis not present

## 2012-09-30 DIAGNOSIS — Z48812 Encounter for surgical aftercare following surgery on the circulatory system: Secondary | ICD-10-CM | POA: Diagnosis not present

## 2012-09-30 DIAGNOSIS — M159 Polyosteoarthritis, unspecified: Secondary | ICD-10-CM | POA: Diagnosis not present

## 2012-10-04 DIAGNOSIS — IMO0001 Reserved for inherently not codable concepts without codable children: Secondary | ICD-10-CM | POA: Diagnosis not present

## 2012-10-04 DIAGNOSIS — M159 Polyosteoarthritis, unspecified: Secondary | ICD-10-CM | POA: Diagnosis not present

## 2012-10-04 DIAGNOSIS — G609 Hereditary and idiopathic neuropathy, unspecified: Secondary | ICD-10-CM | POA: Diagnosis not present

## 2012-10-04 DIAGNOSIS — I10 Essential (primary) hypertension: Secondary | ICD-10-CM | POA: Diagnosis not present

## 2012-10-04 DIAGNOSIS — D45 Polycythemia vera: Secondary | ICD-10-CM | POA: Diagnosis not present

## 2012-10-04 DIAGNOSIS — Z48812 Encounter for surgical aftercare following surgery on the circulatory system: Secondary | ICD-10-CM | POA: Diagnosis not present

## 2012-10-07 DIAGNOSIS — M159 Polyosteoarthritis, unspecified: Secondary | ICD-10-CM | POA: Diagnosis not present

## 2012-10-07 DIAGNOSIS — IMO0001 Reserved for inherently not codable concepts without codable children: Secondary | ICD-10-CM | POA: Diagnosis not present

## 2012-10-07 DIAGNOSIS — G609 Hereditary and idiopathic neuropathy, unspecified: Secondary | ICD-10-CM | POA: Diagnosis not present

## 2012-10-07 DIAGNOSIS — I10 Essential (primary) hypertension: Secondary | ICD-10-CM | POA: Diagnosis not present

## 2012-10-07 DIAGNOSIS — Z48812 Encounter for surgical aftercare following surgery on the circulatory system: Secondary | ICD-10-CM | POA: Diagnosis not present

## 2012-10-07 DIAGNOSIS — D45 Polycythemia vera: Secondary | ICD-10-CM | POA: Diagnosis not present

## 2012-10-08 ENCOUNTER — Other Ambulatory Visit (HOSPITAL_COMMUNITY): Payer: Medicare Other

## 2012-10-10 ENCOUNTER — Ambulatory Visit: Payer: Self-pay | Admitting: Internal Medicine

## 2012-10-10 DIAGNOSIS — Z7901 Long term (current) use of anticoagulants: Secondary | ICD-10-CM

## 2012-10-10 DIAGNOSIS — I482 Chronic atrial fibrillation, unspecified: Secondary | ICD-10-CM

## 2012-10-13 DIAGNOSIS — I4892 Unspecified atrial flutter: Secondary | ICD-10-CM | POA: Diagnosis not present

## 2012-10-15 ENCOUNTER — Encounter (HOSPITAL_COMMUNITY): Payer: Medicare Other | Attending: Oncology

## 2012-10-15 DIAGNOSIS — D45 Polycythemia vera: Secondary | ICD-10-CM | POA: Insufficient documentation

## 2012-10-15 LAB — CBC WITH DIFFERENTIAL/PLATELET
Basophils Absolute: 0.1 10*3/uL (ref 0.0–0.1)
Basophils Relative: 2 % — ABNORMAL HIGH (ref 0–1)
HCT: 43.2 % (ref 36.0–46.0)
Hemoglobin: 14.4 g/dL (ref 12.0–15.0)
Lymphocytes Relative: 21 % (ref 12–46)
MCHC: 33.3 g/dL (ref 30.0–36.0)
Monocytes Absolute: 0.2 10*3/uL (ref 0.1–1.0)
Neutro Abs: 4.2 10*3/uL (ref 1.7–7.7)
Neutrophils Relative %: 73 % (ref 43–77)
RDW: 15.4 % (ref 11.5–15.5)
WBC: 5.8 10*3/uL (ref 4.0–10.5)

## 2012-10-15 NOTE — Progress Notes (Signed)
Labs drawn today for cbc/diff 

## 2012-10-18 ENCOUNTER — Encounter: Payer: Self-pay | Admitting: Vascular Surgery

## 2012-10-18 DIAGNOSIS — I4891 Unspecified atrial fibrillation: Secondary | ICD-10-CM | POA: Diagnosis not present

## 2012-10-18 DIAGNOSIS — E039 Hypothyroidism, unspecified: Secondary | ICD-10-CM | POA: Diagnosis not present

## 2012-10-18 DIAGNOSIS — IMO0002 Reserved for concepts with insufficient information to code with codable children: Secondary | ICD-10-CM | POA: Diagnosis not present

## 2012-10-18 DIAGNOSIS — E785 Hyperlipidemia, unspecified: Secondary | ICD-10-CM | POA: Diagnosis not present

## 2012-10-19 ENCOUNTER — Ambulatory Visit (INDEPENDENT_AMBULATORY_CARE_PROVIDER_SITE_OTHER): Payer: Medicare Other | Admitting: Vascular Surgery

## 2012-10-19 ENCOUNTER — Encounter: Payer: Self-pay | Admitting: Vascular Surgery

## 2012-10-19 VITALS — BP 123/90 | HR 79 | Resp 16 | Ht 72.0 in | Wt 181.9 lb

## 2012-10-19 DIAGNOSIS — M79609 Pain in unspecified limb: Secondary | ICD-10-CM | POA: Insufficient documentation

## 2012-10-19 DIAGNOSIS — I739 Peripheral vascular disease, unspecified: Secondary | ICD-10-CM

## 2012-10-19 NOTE — Progress Notes (Signed)
Subjective:     Patient ID: Charlotte [name], female   DOB: [birth year], 72 y.o.   MRN: [id number]  HPI this 72 year old female returns 4 weeks post right femoral endarterectomy and femoral-popliteal above-knee bypass using 6 mm Gore-Tex for severe claudication right leg. She states her claudication symptoms are completely relieved and she has no rest pain. She does continue to have peripheral neuropathy. She takes one aspirin per day.   Review of Systems     Objective:   Physical Exam BP 123/90  Pulse 79  Resp 16  Ht 6' (1.829 m)  Wt 181 lb 14.4 oz (82.509 kg)  BMI 24.66 kg/m2  SpO2 96%  General well-developed well-nourished female in no apparent stress alert and oriented x3 Right lower extremity with well-healed inguinal and distal thigh wound. 3+ dorsalis pedis pulse palpable with well-perfused right foot but decreased sensation      Assessment:     Nicely functioning right femoral endarterectomy and femoral-popliteal Gore-Tex graft-above-knee-resolution of claudication symptoms     Plan:     Return in 2 months for duplex scan of bypass and ABIs and will then be followed on a regular basis for surveillance

## 2012-10-20 ENCOUNTER — Other Ambulatory Visit (HOSPITAL_COMMUNITY): Payer: Self-pay | Admitting: Oncology

## 2012-10-22 DIAGNOSIS — I10 Essential (primary) hypertension: Secondary | ICD-10-CM | POA: Diagnosis not present

## 2012-10-22 DIAGNOSIS — S72143A Displaced intertrochanteric fracture of unspecified femur, initial encounter for closed fracture: Secondary | ICD-10-CM | POA: Diagnosis not present

## 2012-10-26 DIAGNOSIS — I739 Peripheral vascular disease, unspecified: Secondary | ICD-10-CM | POA: Diagnosis not present

## 2012-10-26 DIAGNOSIS — I83893 Varicose veins of bilateral lower extremities with other complications: Secondary | ICD-10-CM | POA: Diagnosis not present

## 2012-10-26 DIAGNOSIS — I4891 Unspecified atrial fibrillation: Secondary | ICD-10-CM | POA: Diagnosis not present

## 2012-10-26 DIAGNOSIS — Z79899 Other long term (current) drug therapy: Secondary | ICD-10-CM | POA: Diagnosis not present

## 2012-10-26 DIAGNOSIS — E782 Mixed hyperlipidemia: Secondary | ICD-10-CM | POA: Diagnosis not present

## 2012-11-02 ENCOUNTER — Other Ambulatory Visit: Payer: Self-pay | Admitting: *Deleted

## 2012-11-02 DIAGNOSIS — Z48812 Encounter for surgical aftercare following surgery on the circulatory system: Secondary | ICD-10-CM

## 2012-11-02 DIAGNOSIS — I739 Peripheral vascular disease, unspecified: Secondary | ICD-10-CM

## 2012-11-12 ENCOUNTER — Encounter (HOSPITAL_COMMUNITY): Payer: Medicare Other | Attending: Oncology

## 2012-11-12 DIAGNOSIS — D45 Polycythemia vera: Secondary | ICD-10-CM | POA: Diagnosis not present

## 2012-11-12 LAB — CBC WITH DIFFERENTIAL/PLATELET
HCT: 43.3 % (ref 36.0–46.0)
Hemoglobin: 14.7 g/dL (ref 12.0–15.0)
Lymphocytes Relative: 22 % (ref 12–46)
Lymphs Abs: 1.3 10*3/uL (ref 0.7–4.0)
MCHC: 33.9 g/dL (ref 30.0–36.0)
Monocytes Absolute: 0.3 10*3/uL (ref 0.1–1.0)
Monocytes Relative: 4 % (ref 3–12)
Neutro Abs: 4.2 10*3/uL (ref 1.7–7.7)
Neutrophils Relative %: 72 % (ref 43–77)
RBC: 3.64 MIL/uL — ABNORMAL LOW (ref 3.87–5.11)
WBC: 5.8 10*3/uL (ref 4.0–10.5)

## 2012-11-12 NOTE — Progress Notes (Signed)
Labs drawn today for cbc/diff 

## 2012-11-23 DIAGNOSIS — I4892 Unspecified atrial flutter: Secondary | ICD-10-CM | POA: Diagnosis not present

## 2012-11-29 DIAGNOSIS — E039 Hypothyroidism, unspecified: Secondary | ICD-10-CM | POA: Diagnosis not present

## 2012-11-29 DIAGNOSIS — Z6825 Body mass index (BMI) 25.0-25.9, adult: Secondary | ICD-10-CM | POA: Diagnosis not present

## 2012-12-10 ENCOUNTER — Encounter (HOSPITAL_COMMUNITY): Payer: Medicare Other | Attending: Oncology

## 2012-12-10 DIAGNOSIS — D45 Polycythemia vera: Secondary | ICD-10-CM | POA: Diagnosis not present

## 2012-12-10 LAB — CBC WITH DIFFERENTIAL/PLATELET
Basophils Relative: 2 % — ABNORMAL HIGH (ref 0–1)
Eosinophils Absolute: 0 10*3/uL (ref 0.0–0.7)
HCT: 42.4 % (ref 36.0–46.0)
Hemoglobin: 14.3 g/dL (ref 12.0–15.0)
Lymphs Abs: 1.2 10*3/uL (ref 0.7–4.0)
MCH: 40.5 pg — ABNORMAL HIGH (ref 26.0–34.0)
MCHC: 33.7 g/dL (ref 30.0–36.0)
MCV: 120.1 fL — ABNORMAL HIGH (ref 78.0–100.0)
Monocytes Absolute: 0.1 10*3/uL (ref 0.1–1.0)
Monocytes Relative: 3 % (ref 3–12)
Neutrophils Relative %: 73 % (ref 43–77)
RBC: 3.53 MIL/uL — ABNORMAL LOW (ref 3.87–5.11)

## 2012-12-10 NOTE — Progress Notes (Signed)
Labs drawn today for cbc/diff 

## 2012-12-20 ENCOUNTER — Encounter: Payer: Self-pay | Admitting: Vascular Surgery

## 2012-12-21 ENCOUNTER — Ambulatory Visit (INDEPENDENT_AMBULATORY_CARE_PROVIDER_SITE_OTHER): Payer: Medicare Other | Admitting: Vascular Surgery

## 2012-12-21 ENCOUNTER — Encounter (INDEPENDENT_AMBULATORY_CARE_PROVIDER_SITE_OTHER): Payer: Medicare Other | Admitting: *Deleted

## 2012-12-21 ENCOUNTER — Ambulatory Visit: Payer: Medicare Other

## 2012-12-21 ENCOUNTER — Encounter: Payer: Self-pay | Admitting: Vascular Surgery

## 2012-12-21 VITALS — BP 138/93 | HR 95 | Ht 72.0 in | Wt 184.9 lb

## 2012-12-21 DIAGNOSIS — Z48812 Encounter for surgical aftercare following surgery on the circulatory system: Secondary | ICD-10-CM | POA: Insufficient documentation

## 2012-12-21 DIAGNOSIS — I739 Peripheral vascular disease, unspecified: Secondary | ICD-10-CM | POA: Diagnosis not present

## 2012-12-21 NOTE — Progress Notes (Signed)
Subjective:     Patient ID: Charlotte [name], female   DOB: 12/05/1940, 72 y.o.   MRN: [id number]  HPI this 72 year old female returns 3-1/2 months post right femoral-popliteal bypass graft and extensive right femoral endarterectomy for severe claudication right leg. Her claudication symptoms are relieved. She does have chronic neuropathy in both lower extremities. She is also had some worsening of edema in the right leg below the knee and does have a rash on the lateral aspect of her right foot which is new. She has no symptoms in her left leg. She denies rest pain or nonhealing ulcers.  Past Medical History  Diagnosis Date  . Hypertension   . Arthritis   . Varicose veins   . Deaf   . Elevated WBC count     and platelets  . Thyroid disease   . Irregular heart rate   . Polycythemia vera   . Peripheral neuropathy     feet  . Polycythemia vera 10/01/2011  . Atrial fibrillation, chronic   . 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 Peripheral vascular disease     History  Substance Use Topics  . Smoking status: Never Smoker   . Smokeless tobacco: Never Used  . Alcohol Use: No    Family History  Problem Relation Age of Onset  . Diabetes Son     No Known Allergies  Current outpatient prescriptions:aspirin EC 81 MG tablet, Take 81 mg by mouth daily., Disp: , Rfl: ;  diltiazem (DILACOR XR) 180 MG 24 hr capsule, Take 180 mg by mouth daily., Disp: , Rfl: ;  gabapentin (NEURONTIN) 100 MG capsule, Take 100 mg by mouth 3 (three) times daily.  , Disp: , Rfl: ;  hydroxyurea (HYDREA) 500 MG capsule, Take 500-1,000 mg by mouth 2 (two) times daily. 1 capsule in morning and 2 capsules in the evening, Disp: , Rfl:  hydroxyurea (HYDREA) 500 MG capsule, TAKE TWO CAPSULES BY MOUTH EVERY DAY IN THE MORNING AND ONE IN THE EVENING, Disp: 90 capsule, Rfl: 2;  levothyroxine (SYNTHROID, LEVOTHROID) 25 MCG tablet, Take 25 mcg by mouth daily.  , Disp: , Rfl: ;  metoprolol (TOPROL-XL) 100 MG 24 hr  tablet, Take 100 mg by mouth 2 (two) times daily.  , Disp: , Rfl:  oxyCODONE-acetaminophen (PERCOCET/ROXICET) 5-325 MG per tablet, Take 1-2 tablets by mouth every 4 (four) hours as needed., Disp: 30 tablet, Rfl: 0;  warfarin (COUMADIN) 5 MG tablet, Take 5 mg by mouth daily. , Disp: , Rfl:   BP 138/93  Pulse 95  Ht 6' (1.829 m)  Wt 184 lb 14.4 oz (83.87 kg)  BMI 25.07 kg/m2  SpO2 95%  Body mass index is 25.07 kg/(m^2).          Review of Systems denies chest pain, dyspnea on exertion, PND, orthopnea, hemoptysis. Complains of chronic neuropathy in both lower extremities.     Objective:   Physical Exam blood pressure 130/93 heart rate 95 respirations 16 Gen.-alert and oriented x3 in no apparent distress HEENT normal for age Lungs no rhonchi or wheezing Cardiovascular regular rhythm no murmurs carotid pulses 3+ palpable no bruits audible Abdomen soft nontender no palpable masses Musculoskeletal free of  major deformities Skin clear -no rashes Neurologic normal Lower extremities 3+ femoral and dorsalis pedis pulses palpable bilaterally with no edema left leg, 1+ edema right calf and ankle. There is a scaly erythematous rash on the lateral aspect of her right foot with no ulceration noted.  Today I ordered  lower extremity arterial study which I reviewed and interpreted. ABIs 1.13 compared to 0.54 preoperatively.       Assessment:     Patent right femoral popliteal bypass graft (Gore-Tex) with resolution of claudication Mild chronic venous insufficiency with chronic edema and reflux in the right great saphenous vein and deep venous system with skin rash right foot    Plan:     #1 short-leg elastic compression stockings #2 try Cortaid ointment on rash on foot-no improvement C. dermatologist #3 patient does have symptoms of chronic venous insufficiency but will preserve right great saphenous vein for possible revascularization in the future-if Gore-Tex fails  #4 repeat duplex  scan of right leg bypass in 3 months

## 2012-12-22 ENCOUNTER — Ambulatory Visit: Payer: Medicare Other

## 2012-12-22 ENCOUNTER — Ambulatory Visit (INDEPENDENT_AMBULATORY_CARE_PROVIDER_SITE_OTHER): Payer: Medicare Other | Admitting: Pharmacist Clinician (PhC)/ Clinical Pharmacy Specialist

## 2012-12-22 DIAGNOSIS — Z7901 Long term (current) use of anticoagulants: Secondary | ICD-10-CM | POA: Diagnosis not present

## 2012-12-22 DIAGNOSIS — I4891 Unspecified atrial fibrillation: Secondary | ICD-10-CM

## 2012-12-22 DIAGNOSIS — I482 Chronic atrial fibrillation, unspecified: Secondary | ICD-10-CM

## 2012-12-23 ENCOUNTER — Other Ambulatory Visit: Payer: Self-pay | Admitting: *Deleted

## 2012-12-23 DIAGNOSIS — I739 Peripheral vascular disease, unspecified: Secondary | ICD-10-CM

## 2012-12-23 DIAGNOSIS — Z48812 Encounter for surgical aftercare following surgery on the circulatory system: Secondary | ICD-10-CM

## 2013-01-07 ENCOUNTER — Encounter (HOSPITAL_COMMUNITY): Payer: Medicare Other | Attending: Oncology

## 2013-01-07 DIAGNOSIS — D45 Polycythemia vera: Secondary | ICD-10-CM | POA: Insufficient documentation

## 2013-01-07 LAB — CBC WITH DIFFERENTIAL/PLATELET
Basophils Absolute: 0.1 10*3/uL (ref 0.0–0.1)
Eosinophils Absolute: 0 10*3/uL (ref 0.0–0.7)
Eosinophils Relative: 0 % (ref 0–5)
HCT: 43.8 % (ref 36.0–46.0)
Lymphocytes Relative: 17 % (ref 12–46)
Lymphs Abs: 1.2 10*3/uL (ref 0.7–4.0)
MCH: 40.5 pg — ABNORMAL HIGH (ref 26.0–34.0)
MCV: 119 fL — ABNORMAL HIGH (ref 78.0–100.0)
Monocytes Absolute: 0.2 10*3/uL (ref 0.1–1.0)
Platelets: 218 10*3/uL (ref 150–400)
RDW: 15.1 % (ref 11.5–15.5)
WBC: 7 10*3/uL (ref 4.0–10.5)

## 2013-01-07 NOTE — Progress Notes (Signed)
Labs drawn today for cbc/diff 

## 2013-01-18 ENCOUNTER — Other Ambulatory Visit (HOSPITAL_COMMUNITY): Payer: Self-pay | Admitting: Oncology

## 2013-01-18 DIAGNOSIS — D45 Polycythemia vera: Secondary | ICD-10-CM

## 2013-01-18 MED ORDER — HYDROXYUREA 500 MG PO CAPS: ORAL_CAPSULE | ORAL | Status: DC

## 2013-01-19 ENCOUNTER — Ambulatory Visit: Payer: Medicare Other | Admitting: Pharmacist Clinician (PhC)/ Clinical Pharmacy Specialist

## 2013-01-19 DIAGNOSIS — I4891 Unspecified atrial fibrillation: Secondary | ICD-10-CM | POA: Diagnosis not present

## 2013-02-02 DIAGNOSIS — I4891 Unspecified atrial fibrillation: Secondary | ICD-10-CM | POA: Diagnosis not present

## 2013-02-03 ENCOUNTER — Encounter (HOSPITAL_COMMUNITY): Payer: Medicare Other

## 2013-02-03 ENCOUNTER — Encounter (HOSPITAL_COMMUNITY): Payer: Medicare Other | Attending: Oncology

## 2013-02-03 DIAGNOSIS — D45 Polycythemia vera: Secondary | ICD-10-CM | POA: Diagnosis not present

## 2013-02-03 LAB — CBC WITH DIFFERENTIAL/PLATELET
Basophils Absolute: 0.1 10*3/uL (ref 0.0–0.1)
Basophils Relative: 2 % — ABNORMAL HIGH (ref 0–1)
Eosinophils Absolute: 0 10*3/uL (ref 0.0–0.7)
Eosinophils Relative: 1 % (ref 0–5)
HCT: 43.7 % (ref 36.0–46.0)
MCHC: 33.9 g/dL (ref 30.0–36.0)
MCV: 119.1 fL — ABNORMAL HIGH (ref 78.0–100.0)
Monocytes Absolute: 0.2 10*3/uL (ref 0.1–1.0)
Platelets: 272 10*3/uL (ref 150–400)
RDW: 15.1 % (ref 11.5–15.5)

## 2013-02-03 NOTE — Progress Notes (Signed)
Labs drawn today for cbc/diff 

## 2013-02-16 DIAGNOSIS — I4891 Unspecified atrial fibrillation: Secondary | ICD-10-CM | POA: Diagnosis not present

## 2013-02-16 DIAGNOSIS — Z7901 Long term (current) use of anticoagulants: Secondary | ICD-10-CM | POA: Diagnosis not present

## 2013-02-18 ENCOUNTER — Encounter (HOSPITAL_BASED_OUTPATIENT_CLINIC_OR_DEPARTMENT_OTHER): Payer: Medicare Other

## 2013-02-18 VITALS — BP 132/76 | HR 102 | Temp 97.4°F | Resp 20 | Wt 181.7 lb

## 2013-02-18 DIAGNOSIS — D45 Polycythemia vera: Secondary | ICD-10-CM | POA: Diagnosis not present

## 2013-02-18 NOTE — Patient Instructions (Addendum)
[facility] Discharge Instructions  RECOMMENDATIONS MADE BY THE CONSULTANT AND ANY TEST RESULTS WILL BE SENT TO YOUR REFERRING PHYSICIAN.  EXAM FINDINGS BY THE PHYSICIAN TODAY AND SIGNS OR SYMPTOMS TO REPORT TO CLINIC OR PRIMARY PHYSICIAN:    We are checking for JAK 2 genotype mutation. This is a genetic mutation that can occur in some people. It will take about a week to get back.   See you in 6 months August 22, 2013 @ 11:30   Thank you for choosing [facility] to provide your oncology and hematology care.  To afford each patient quality time with our providers, please arrive at least 15 minutes before your scheduled appointment time.  With your help, our goal is to use those 15 minutes to complete the necessary work-up to ensure our physicians have the information they need to help with your evaluation and healthcare recommendations.    Effective January 1st, 2014, we ask that you re-schedule your appointment with our physicians should you arrive 10 or more minutes late for your appointment.  We strive to give you quality time with our providers, and arriving late affects you and other patients whose appointments are after yours.    Again, thank you for choosing [facility].  Our hope is that these requests will decrease the amount of time that you wait before being seen by our physicians.       _____________________________________________________________  Should you have questions after your visit to [facility], please contact our office at [phone] between the hours of 8:30 a.m. and 5:00 p.m.  Voicemails left after 4:30 p.m. will not be returned until the following business day.  For prescription refill requests, have your pharmacy contact our office with your prescription refill request.

## 2013-02-18 NOTE — Addendum Note (Signed)
Addended by: Gerhard Perches on: 02/18/2013 02:07 PM   Modules accepted: Orders

## 2013-02-18 NOTE — Progress Notes (Addendum)
[facility] [phone]   Fax:([phone]) [place], MD [id number] [place] [address]  DIAGNOSIS:  Polycythemia vera.    INTERVAL HISTORY:   Charlotte [name] 72 y.o. female returns to the clinic today for scheduled follow up.  She has a history of polycythemia which dates back to 2005 at least. Presently she tell me that she  is on Hydrea 500 mg in morning and 1 g in the afternoon daily.  She is tolerating this very well and denies any side effects at this time.   She's had multiple recent surgeries on the right lower extremity locate to anterior disease and hip problem.    Patient has significant hearing impairment. She tells me she feels well, denies any pruritus, does not report any recent thromboembolic event or CVA. She also denies fever. Since approximately one year ago,she has had about 35 pounds Weight Loss which she attributes  to multiple recent surgeries and associates reduced oral intake. She was accompanied by her husband.  MEDICAL HISTORY: Past Medical History  Diagnosis Date  . Hypertension   . Arthritis   . Varicose veins   . Deaf   . Elevated WBC count     and platelets  . Thyroid disease   . Irregular heart rate   . 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 Peripheral neuropathy     feet  . 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 Atrial fibrillation, chronic   . 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 Peripheral vascular disease     ALLERGIES:  has No Known Allergies.  MEDICATIONS:  Current Outpatient Prescriptions  Medication Sig Dispense Refill  . aspirin EC 81 MG tablet Take 81 mg by mouth daily.      Marland Kitchen diltiazem (DILACOR XR) 180 MG 24 hr capsule Take 180 mg by mouth daily.      Marland Kitchen gabapentin (NEURONTIN) 100 MG capsule Take 100 mg by mouth 3 (three) times daily.        . hydroxyurea (HYDREA) 500 MG capsule Take 500mg  in AM and 1000 mg in PM, PO, daily        . levothyroxine (SYNTHROID, LEVOTHROID) 50 MCG tablet Take 50 mcg by mouth daily before breakfast.      . metoprolol (TOPROL-XL) 100 MG 24 hr tablet Take 100 mg by mouth 2 (two) times daily.        Marland Kitchen warfarin (COUMADIN) 5 MG tablet Take 5 mg by mouth daily.       No current facility-administered medications for this visit.    SURGICAL HISTORY:  Past Surgical History  Procedure Laterality Date  . Blt    . Abdominal hysterectomy    . Coronary angioplasty    . Ct of abd    . Hip pinning,cannulated  11/19/2011    Procedure: CANNULATED HIP PINNING;  Surgeon: Sanjuana Kava, MD;  Location: [facility];  Service: Orthopedics;  Laterality: Right;  . Fasciotomy  11/29/2011    Procedure: FASCIOTOMY;  Surgeon: Carole Civil, MD;  Location: [facility];  Service: Orthopedics;  Laterality: Right;  right thigh   . Dressing change under anesthesia  12/02/2011    Procedure: DRESSING CHANGE UNDER ANESTHESIA;  Surgeon: Carole Civil, MD;  Location: [facility];  Service: Orthopedics;  Laterality: Right;  . Endarterectomy femoral Right 09/15/2012    Procedure: ENDARTERECTOMY FEMORAL;  Surgeon: Mal Misty, MD;  Location: [facility];  Service: Vascular;  Laterality: Right;  . Femoral-popliteal bypass graft Right 09/15/2012    Procedure: BYPASS GRAFT FEMORAL-POPLITEAL ARTERY;  Surgeon: Mal Misty, MD;  Location: [facility];  Service: Vascular;  Laterality: Right;  . Patch angioplasty Right 09/15/2012    Procedure: PATCH ANGIOPLASTY;  Surgeon: Mal Misty, MD;  Location: [facility];  Service: Vascular;  Laterality: Right;     REVIEW OF SYSTEMS: 14 point review of system is as in the history above otherwise negative.    PHYSICAL EXAMINATION:  Blood pressure 132/76, pulse 102, temperature 97.4 F (36.3 C), temperature source Oral, resp. rate 20, weight 181 lb 11.2 oz (82.419 kg). GENERAL: No distress. SKIN:  No rashes or significant lesions, ecchymosis or petechial rash. HEAD: Normocephalic, No masses, lesions,  tenderness or abnormalities  EYES: Conjunctiva are pink and non-injected  ENT: External ears normal ,lips, buccal mucosa, and tongue normal and mucous membranes are moist  LYMPH: No palpable lymphadenopathy, in the neck or supraclavicular areas. LUNGS: clear to auscultation , no crackles or wheezes HEART: regular rate & rhythm, no murmurs, no gallops, S1 normal and S2 normal  ABDOMEN: Abdomen soft, non-tender, normal bowel sounds, no masses or organomegaly and no hepatosplenomegaly palpable EXTREMITIES: No edema, no skin discoloration or tenderness      LABORATORY DATA: Lab Results  Component Value Date   WBC 5.6 02/03/2013   HGB 14.8 02/03/2013   HCT 43.7 02/03/2013   MCV 119.1* 02/03/2013   PLT 272 02/03/2013      Chemistry      Component Value Date/Time   NA 135 09/16/2012 0320   K 3.9 09/16/2012 0320   CL 102 09/16/2012 0320   CO2 24 09/16/2012 0320   BUN 12 09/16/2012 0320   CREATININE 0.66 09/16/2012 0320      Component Value Date/Time   CALCIUM 8.7 09/16/2012 0320   ALKPHOS 72 08/20/2012 1215   AST 19 08/20/2012 1215   ALT 12 08/20/2012 1215   BILITOT 0.9 08/20/2012 1215       RADIOGRAPHIC STUDIES: No results found.   ASSESSMENT:  Ms. Orear is stable and as regards the polycythemia.  Hematocrit is up to get that less than 45.  She's had some weight loss but this is actually able to her recent surgeries.  I looked into  her paper charts and noted that the patient has not had a Jak 2 testing documented. Low EPO level at diagnosis noted.   PLAN:  1. I ordered Jak 2 testing today. 2. Repeat CBC / CMP at next visit. 3. Return to clinic in 6 months.     All questions were satisfactorily answered. Patient knows to call if  any concern arises.  I spent more than 50 % counseling the patient face to face. The total time spent in the appointment was 30 minutes.   Verlan Friends, MD FACP. Hematology/Oncology.     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

## 2013-02-24 LAB — JAK2 GENOTYPR: JAK2 GenotypR: DETECTED

## 2013-02-25 DIAGNOSIS — S72143A Displaced intertrochanteric fracture of unspecified femur, initial encounter for closed fracture: Secondary | ICD-10-CM | POA: Diagnosis not present

## 2013-02-25 DIAGNOSIS — I10 Essential (primary) hypertension: Secondary | ICD-10-CM | POA: Diagnosis not present

## 2013-03-04 ENCOUNTER — Encounter (HOSPITAL_COMMUNITY): Payer: Medicare Other | Attending: Oncology

## 2013-03-04 DIAGNOSIS — D45 Polycythemia vera: Secondary | ICD-10-CM | POA: Insufficient documentation

## 2013-03-04 LAB — CBC WITH DIFFERENTIAL/PLATELET
Basophils Absolute: 0.1 10*3/uL (ref 0.0–0.1)
Basophils Relative: 1 % (ref 0–1)
Eosinophils Absolute: 0 10*3/uL (ref 0.0–0.7)
MCH: 38.7 pg — ABNORMAL HIGH (ref 26.0–34.0)
MCHC: 33.4 g/dL (ref 30.0–36.0)
Monocytes Relative: 3 % (ref 3–12)
Neutrophils Relative %: 73 % (ref 43–77)
Platelets: 147 10*3/uL — ABNORMAL LOW (ref 150–400)
RDW: 14.9 % (ref 11.5–15.5)

## 2013-03-04 NOTE — Progress Notes (Signed)
Labs drawn today for cbc/diff 

## 2013-03-17 DIAGNOSIS — Z7901 Long term (current) use of anticoagulants: Secondary | ICD-10-CM | POA: Diagnosis not present

## 2013-03-17 DIAGNOSIS — I4891 Unspecified atrial fibrillation: Secondary | ICD-10-CM | POA: Diagnosis not present

## 2013-03-29 ENCOUNTER — Encounter (INDEPENDENT_AMBULATORY_CARE_PROVIDER_SITE_OTHER): Payer: Medicare Other | Admitting: *Deleted

## 2013-03-29 DIAGNOSIS — Z48812 Encounter for surgical aftercare following surgery on the circulatory system: Secondary | ICD-10-CM

## 2013-03-29 DIAGNOSIS — I739 Peripheral vascular disease, unspecified: Secondary | ICD-10-CM

## 2013-03-30 ENCOUNTER — Other Ambulatory Visit: Payer: Self-pay | Admitting: *Deleted

## 2013-03-30 DIAGNOSIS — I739 Peripheral vascular disease, unspecified: Secondary | ICD-10-CM

## 2013-03-30 DIAGNOSIS — Z48812 Encounter for surgical aftercare following surgery on the circulatory system: Secondary | ICD-10-CM

## 2013-03-31 ENCOUNTER — Encounter: Payer: Self-pay | Admitting: Vascular Surgery

## 2013-04-01 ENCOUNTER — Encounter (HOSPITAL_BASED_OUTPATIENT_CLINIC_OR_DEPARTMENT_OTHER): Payer: Medicare Other

## 2013-04-01 DIAGNOSIS — D45 Polycythemia vera: Secondary | ICD-10-CM | POA: Diagnosis not present

## 2013-04-01 LAB — DIFFERENTIAL
Eosinophils Absolute: 0 10*3/uL (ref 0.0–0.7)
Lymphs Abs: 1.4 10*3/uL (ref 0.7–4.0)
Neutro Abs: 4 10*3/uL (ref 1.7–7.7)
Neutrophils Relative %: 69 % (ref 43–77)

## 2013-04-01 LAB — CBC
MCH: 38.8 pg — ABNORMAL HIGH (ref 26.0–34.0)
Platelets: 198 10*3/uL (ref 150–400)
RBC: 3.94 MIL/uL (ref 3.87–5.11)
WBC: 5.8 10*3/uL (ref 4.0–10.5)

## 2013-04-01 NOTE — Progress Notes (Signed)
Labs drawn today for cbc/diff 

## 2013-04-08 ENCOUNTER — Other Ambulatory Visit (HOSPITAL_COMMUNITY): Payer: Medicare Other

## 2013-04-13 DIAGNOSIS — Z7901 Long term (current) use of anticoagulants: Secondary | ICD-10-CM | POA: Diagnosis not present

## 2013-04-13 DIAGNOSIS — I4891 Unspecified atrial fibrillation: Secondary | ICD-10-CM | POA: Diagnosis not present

## 2013-04-14 ENCOUNTER — Other Ambulatory Visit (HOSPITAL_COMMUNITY): Payer: Self-pay | Admitting: Oncology

## 2013-04-14 DIAGNOSIS — D45 Polycythemia vera: Secondary | ICD-10-CM

## 2013-04-14 MED ORDER — HYDROXYUREA 500 MG PO CAPS: ORAL_CAPSULE | ORAL | Status: DC

## 2013-04-18 ENCOUNTER — Ambulatory Visit: Payer: Self-pay | Admitting: Pharmacist Clinician (PhC)/ Clinical Pharmacy Specialist

## 2013-04-18 ENCOUNTER — Other Ambulatory Visit: Payer: Self-pay | Admitting: Internal Medicine

## 2013-04-18 DIAGNOSIS — Z7901 Long term (current) use of anticoagulants: Secondary | ICD-10-CM

## 2013-04-18 DIAGNOSIS — I482 Chronic atrial fibrillation, unspecified: Secondary | ICD-10-CM

## 2013-04-29 ENCOUNTER — Encounter (HOSPITAL_COMMUNITY): Payer: Medicare Other | Attending: Oncology

## 2013-04-29 DIAGNOSIS — D45 Polycythemia vera: Secondary | ICD-10-CM

## 2013-04-29 LAB — DIFFERENTIAL
Eosinophils Absolute: 0 10*3/uL (ref 0.0–0.7)
Eosinophils Relative: 0 % (ref 0–5)
Lymphs Abs: 1.4 10*3/uL (ref 0.7–4.0)
Monocytes Relative: 3 % (ref 3–12)
Neutro Abs: 5.1 10*3/uL (ref 1.7–7.7)

## 2013-04-29 LAB — CBC
MCV: 114.3 fL — ABNORMAL HIGH (ref 78.0–100.0)
Platelets: 116 10*3/uL — ABNORMAL LOW (ref 150–400)
RBC: 3.84 MIL/uL — ABNORMAL LOW (ref 3.87–5.11)
WBC: 6.9 10*3/uL (ref 4.0–10.5)

## 2013-04-29 NOTE — Progress Notes (Signed)
Labs drawn today for cbc/diff 

## 2013-05-09 DIAGNOSIS — Z23 Encounter for immunization: Secondary | ICD-10-CM | POA: Diagnosis not present

## 2013-05-11 DIAGNOSIS — Z7901 Long term (current) use of anticoagulants: Secondary | ICD-10-CM | POA: Diagnosis not present

## 2013-05-11 DIAGNOSIS — I4891 Unspecified atrial fibrillation: Secondary | ICD-10-CM | POA: Diagnosis not present

## 2013-05-23 ENCOUNTER — Encounter: Payer: Self-pay | Admitting: Cardiovascular Disease

## 2013-05-23 ENCOUNTER — Ambulatory Visit (INDEPENDENT_AMBULATORY_CARE_PROVIDER_SITE_OTHER): Payer: Medicare Other | Admitting: Cardiovascular Disease

## 2013-05-23 VITALS — BP 140/84 | HR 60 | Ht 72.0 in | Wt 187.5 lb

## 2013-05-23 DIAGNOSIS — I10 Essential (primary) hypertension: Secondary | ICD-10-CM | POA: Diagnosis not present

## 2013-05-23 DIAGNOSIS — Z7901 Long term (current) use of anticoagulants: Secondary | ICD-10-CM | POA: Diagnosis not present

## 2013-05-23 DIAGNOSIS — I7092 Chronic total occlusion of artery of the extremities: Secondary | ICD-10-CM

## 2013-05-23 DIAGNOSIS — I4891 Unspecified atrial fibrillation: Secondary | ICD-10-CM | POA: Diagnosis not present

## 2013-05-23 DIAGNOSIS — I482 Chronic atrial fibrillation, unspecified: Secondary | ICD-10-CM

## 2013-05-23 NOTE — Progress Notes (Signed)
Patient ID: Charlotte [name], female   DOB: [birth year], 72 y.o.   MRN: [id number]       CARDIOLOGY CONSULT NOTE  Patient ID: Charlotte [name] MRN: [id number] DOB/AGE: 1941-07-10 72 y.o.  Admit date: (Not on file) Primary Physician Charlotte Kilts, MD  Reason for Consultation: atrial fibrillation  HPI: Charlotte [name] is a 72 yr old woman with a PMH significant for permanent atrial fibrillation, polycythemia vera, HTN, and PVD with a h/o right fem-pop bypass and endarterectomy. She was formerly seen by Dr. Mali [name] of [facility].  She is doing well, and denies chest pain, shortness of breath, palpitations, and syncope.  Echocardiogram from 12/2011 revealed normal LV systolic function, EF 0000000, mild MR, and moderate LAE/mild RAE.  SocHx: denies smoking and alcohol use.  FamHx: noncontributory    No Known Allergies  Current Outpatient Prescriptions  Medication Sig Dispense Refill  . aspirin EC 81 MG tablet Take 81 mg by mouth daily.      Marland Kitchen diltiazem (DILACOR XR) 180 MG 24 hr capsule Take 180 mg by mouth daily.      Marland Kitchen gabapentin (NEURONTIN) 100 MG capsule Take 100 mg by mouth 3 (three) times daily.        . hydroxyurea (HYDREA) 500 MG capsule Take 500mg  in AM and 1000 mg in PM, PO, daily  90 capsule  2  . levothyroxine (SYNTHROID, LEVOTHROID) 50 MCG tablet Take 50 mcg by mouth daily before breakfast.      . metoprolol (TOPROL-XL) 100 MG 24 hr tablet Take 100 mg by mouth 2 (two) times daily.        Marland Kitchen warfarin (COUMADIN) 5 MG tablet TAKE AS DIRECTED BY PHYSICIAN  30 tablet  0   No current facility-administered medications for this visit.    Past Medical History  Diagnosis Date  . Hypertension   . Arthritis   . Varicose veins   . Deaf   . Elevated WBC count     and platelets  . Thyroid disease   . Irregular heart rate   . 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 Peripheral neuropathy     feet  . 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 Atrial fibrillation, chronic   . 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 Peripheral vascular disease     Past Surgical History  Procedure Laterality Date  . Blt    . Abdominal hysterectomy    . Coronary angioplasty    . Ct of abd    . Hip pinning,cannulated  11/19/2011    Procedure: CANNULATED HIP PINNING;  Surgeon: Charlotte Kava, MD;  Location: [facility];  Service: Orthopedics;  Laterality: Right;  . Fasciotomy  11/29/2011    Procedure: FASCIOTOMY;  Surgeon: Charlotte Civil, MD;  Location: [facility];  Service: Orthopedics;  Laterality: Right;  right thigh   . Dressing change under anesthesia  12/02/2011    Procedure: DRESSING CHANGE UNDER ANESTHESIA;  Surgeon: Charlotte Civil, MD;  Location: [facility];  Service: Orthopedics;  Laterality: Right;  . Endarterectomy femoral Right 09/15/2012    Procedure: ENDARTERECTOMY FEMORAL;  Surgeon: Charlotte Misty, MD;  Location: [place];  Service: Vascular;  Laterality: Right;  . Femoral-popliteal bypass graft Right 09/15/2012    Procedure: BYPASS GRAFT FEMORAL-POPLITEAL ARTERY;  Surgeon: Charlotte Misty, MD;  Location: [place];  Service: Vascular;  Laterality: Right;  . Patch angioplasty Right 09/15/2012    Procedure: PATCH ANGIOPLASTY;  Surgeon: Charlotte Misty, MD;  Location: [place];  Service:  Vascular;  Laterality: Right;    History   Social History  . Marital Status: Married    Spouse Name: N/A    Number of Children: N/A  . Years of Education: N/A   Occupational History  . Not on file.   Social History Main Topics  . Smoking status: Never Smoker   . Smokeless tobacco: Never Used  . Alcohol Use: No  . Drug Use: No  . Sexual Activity: No   Other Topics Concern  . Not on file   Social History Narrative  . No narrative on file     Family History  Problem Relation Age of Onset  . Diabetes Son      Prior to Admission medications   Medication Sig Start Date End Date Taking? Authorizing Provider  aspirin EC 81 MG tablet Take 81 mg by mouth daily.   Yes Historical Provider, MD   diltiazem (DILACOR XR) 180 MG 24 hr capsule Take 180 mg by mouth daily.   Yes Historical Provider, MD  gabapentin (NEURONTIN) 100 MG capsule Take 100 mg by mouth 3 (three) times daily.     Yes Historical Provider, MD  hydroxyurea (HYDREA) 500 MG capsule Take 500mg  in AM and 1000 mg in PM, PO, daily 04/14/13  Yes Charlotte Cancer, PA-C  levothyroxine (SYNTHROID, LEVOTHROID) 50 MCG tablet Take 50 mcg by mouth daily before breakfast.   Yes Historical Provider, MD  metoprolol (TOPROL-XL) 100 MG 24 hr tablet Take 100 mg by mouth 2 (two) times daily.     Yes Historical Provider, MD  warfarin (COUMADIN) 5 MG tablet TAKE AS DIRECTED BY PHYSICIAN 04/18/13  Yes Charlotte [name], RPH-CPP     Review of systems complete and found to be negative unless listed above in HPI     Physical exam Blood pressure 140/84, pulse 60, height 6' (1.829 m), weight 187 lb 8 oz (85.049 kg). General: NAD Neck: No JVD, no thyromegaly or thyroid nodule.  Lungs: Clear to auscultation bilaterally with normal respiratory effort. CV: Nondisplaced PMI.  Heart irregular rhythm, with normal S1/S2, no S3/S4, no murmur.  No peripheral edema.  No carotid bruit.  Normal pedal pulses.  Abdomen: Soft, nontender, no hepatosplenomegaly, no distention.  Skin: Intact without lesions or rashes.  Neurologic: Alert and oriented x 3.  Psych: Normal affect. Extremities: No clubbing or cyanosis.  HEENT: Normal.   Labs:   Lab Results  Component Value Date   WBC 6.9 04/29/2013   HGB 14.6 04/29/2013   HCT 43.9 04/29/2013   MCV 114.3* 04/29/2013   PLT 116* 04/29/2013   No results found for this basename: NA, K, CL, CO2, BUN, CREATININE, CALCIUM, LABALBU, PROT, BILITOT, ALKPHOS, ALT, AST, GLUCOSE,  in the last 168 hours Lab Results  Component Value Date   CKTOTAL 758* 12/01/2011   CKMB 7.4* 11/29/2011    Lab Results  Component Value Date   CHOL  Value: 119        ATP III CLASSIFICATION:  <200     mg/dL   Desirable  200-239  mg/dL    Borderline High  >=240    mg/dL   High        07/19/2009   Lab Results  Component Value Date   HDL 32* 07/19/2009   Lab Results  Component Value Date   LDLCALC  Value: 62        Total Cholesterol/HDL:CHD Risk Coronary Heart Disease Risk Table  Men   Women  1/2 Average Risk   3.4   3.3  Average Risk       5.0   4.4  2 X Average Risk   9.6   7.1  3 X Average Risk  23.4   11.0        Use the calculated Patient Ratio above and the CHD Risk Table to determine the patient's CHD Risk.        ATP III CLASSIFICATION (LDL):  <100     mg/dL   Optimal  100-129  mg/dL   Near or Above                    Optimal  130-159  mg/dL   Borderline  160-189  mg/dL   High  >190     mg/dL   Very High 07/19/2009   Lab Results  Component Value Date   TRIG 124 07/19/2009   Lab Results  Component Value Date   CHOLHDL 3.7 07/19/2009   No results found for this basename: LDLDIRECT       EKG: see 09/2012  Studies: See HPI for echo results   ASSESSMENT AND PLAN: 1. Permanent atrial fibrillation: rate is controlled on current doses of metoprolol and diltiazem. Will enroll in warfarin clinic for INR monitoring. 2. HTN: relatively well controlled. 3. PVD: follows closely with vascular surgery.  Signed: Kate Sable, M.D., F.A.C.C.  05/23/2013, 1:43 PM

## 2013-05-23 NOTE — Patient Instructions (Addendum)
Your physician recommends that you schedule a follow-up appointment in: ONE YEAR WITH Dr. Delorise Jackson WITH [facility]

## 2013-05-24 DIAGNOSIS — R0989 Other specified symptoms and signs involving the circulatory and respiratory systems: Secondary | ICD-10-CM | POA: Diagnosis not present

## 2013-05-24 DIAGNOSIS — R0609 Other forms of dyspnea: Secondary | ICD-10-CM | POA: Diagnosis not present

## 2013-05-24 DIAGNOSIS — D3190 Benign neoplasm of unspecified part of unspecified eye: Secondary | ICD-10-CM | POA: Diagnosis not present

## 2013-05-24 DIAGNOSIS — E039 Hypothyroidism, unspecified: Secondary | ICD-10-CM | POA: Diagnosis not present

## 2013-05-24 DIAGNOSIS — I4891 Unspecified atrial fibrillation: Secondary | ICD-10-CM | POA: Diagnosis not present

## 2013-05-24 DIAGNOSIS — IMO0002 Reserved for concepts with insufficient information to code with codable children: Secondary | ICD-10-CM | POA: Diagnosis not present

## 2013-05-25 ENCOUNTER — Ambulatory Visit (INDEPENDENT_AMBULATORY_CARE_PROVIDER_SITE_OTHER): Payer: Medicare Other | Admitting: *Deleted

## 2013-05-25 ENCOUNTER — Encounter: Payer: Self-pay | Admitting: Cardiovascular Disease

## 2013-05-25 DIAGNOSIS — I482 Chronic atrial fibrillation, unspecified: Secondary | ICD-10-CM

## 2013-05-25 DIAGNOSIS — I4891 Unspecified atrial fibrillation: Secondary | ICD-10-CM | POA: Diagnosis not present

## 2013-05-25 DIAGNOSIS — Z7901 Long term (current) use of anticoagulants: Secondary | ICD-10-CM

## 2013-05-25 LAB — POCT INR: INR: 2.5

## 2013-05-25 MED ORDER — WARFARIN SODIUM 5 MG PO TABS: 5.0000 mg | ORAL_TABLET | Freq: Every day | ORAL | Status: DC

## 2013-05-27 ENCOUNTER — Encounter (HOSPITAL_COMMUNITY): Payer: Medicare Other | Attending: Oncology

## 2013-05-27 DIAGNOSIS — D45 Polycythemia vera: Secondary | ICD-10-CM | POA: Diagnosis not present

## 2013-05-27 LAB — CBC WITH DIFFERENTIAL/PLATELET
Basophils Absolute: 0.1 10*3/uL (ref 0.0–0.1)
Eosinophils Relative: 1 % (ref 0–5)
HCT: 45.5 % (ref 36.0–46.0)
Hemoglobin: 15.2 g/dL — ABNORMAL HIGH (ref 12.0–15.0)
Lymphocytes Relative: 17 % (ref 12–46)
MCHC: 33.4 g/dL (ref 30.0–36.0)
MCV: 116.7 fL — ABNORMAL HIGH (ref 78.0–100.0)
Monocytes Absolute: 0.2 10*3/uL (ref 0.1–1.0)
Neutrophils Relative %: 77 % (ref 43–77)
RBC: 3.9 MIL/uL (ref 3.87–5.11)
RDW: 16 % — ABNORMAL HIGH (ref 11.5–15.5)
WBC: 5.6 10*3/uL (ref 4.0–10.5)

## 2013-05-27 NOTE — Progress Notes (Signed)
Labs drawn today for cbc/diff 

## 2013-06-10 DIAGNOSIS — I10 Essential (primary) hypertension: Secondary | ICD-10-CM | POA: Diagnosis not present

## 2013-06-10 DIAGNOSIS — S72143A Displaced intertrochanteric fracture of unspecified femur, initial encounter for closed fracture: Secondary | ICD-10-CM | POA: Diagnosis not present

## 2013-06-14 ENCOUNTER — Other Ambulatory Visit: Payer: Self-pay | Admitting: Vascular Surgery

## 2013-06-14 DIAGNOSIS — I739 Peripheral vascular disease, unspecified: Secondary | ICD-10-CM

## 2013-06-14 DIAGNOSIS — Z48812 Encounter for surgical aftercare following surgery on the circulatory system: Secondary | ICD-10-CM

## 2013-06-15 ENCOUNTER — Ambulatory Visit (INDEPENDENT_AMBULATORY_CARE_PROVIDER_SITE_OTHER): Payer: Medicare Other | Admitting: *Deleted

## 2013-06-15 DIAGNOSIS — Z7901 Long term (current) use of anticoagulants: Secondary | ICD-10-CM | POA: Diagnosis not present

## 2013-06-15 DIAGNOSIS — I482 Chronic atrial fibrillation, unspecified: Secondary | ICD-10-CM

## 2013-06-15 DIAGNOSIS — I4891 Unspecified atrial fibrillation: Secondary | ICD-10-CM

## 2013-06-24 ENCOUNTER — Encounter (HOSPITAL_COMMUNITY): Payer: Medicare Other | Attending: Oncology

## 2013-06-24 DIAGNOSIS — D45 Polycythemia vera: Secondary | ICD-10-CM | POA: Diagnosis not present

## 2013-06-24 LAB — DIFFERENTIAL
Basophils Absolute: 0.1 10*3/uL (ref 0.0–0.1)
Basophils Relative: 2 % — ABNORMAL HIGH (ref 0–1)
Eosinophils Absolute: 0 10*3/uL (ref 0.0–0.7)
Lymphocytes Relative: 17 % (ref 12–46)
Monocytes Relative: 4 % (ref 3–12)
Neutrophils Relative %: 77 % (ref 43–77)

## 2013-06-24 LAB — CBC
HCT: 48.8 % — ABNORMAL HIGH (ref 36.0–46.0)
Hemoglobin: 16.4 g/dL — ABNORMAL HIGH (ref 12.0–15.0)
MCHC: 33.6 g/dL (ref 30.0–36.0)
Platelets: 178 10*3/uL (ref 150–400)
RDW: 16.9 % — ABNORMAL HIGH (ref 11.5–15.5)
WBC: 6.6 10*3/uL (ref 4.0–10.5)

## 2013-06-24 NOTE — Progress Notes (Signed)
Labs drawn today for cbc/diff 

## 2013-06-28 ENCOUNTER — Ambulatory Visit: Payer: Medicare Other | Admitting: Family

## 2013-06-28 ENCOUNTER — Other Ambulatory Visit (HOSPITAL_COMMUNITY): Payer: Medicare Other

## 2013-06-28 ENCOUNTER — Encounter (HOSPITAL_COMMUNITY): Payer: Medicare Other

## 2013-07-06 ENCOUNTER — Ambulatory Visit (INDEPENDENT_AMBULATORY_CARE_PROVIDER_SITE_OTHER): Payer: Medicare Other | Admitting: *Deleted

## 2013-07-06 DIAGNOSIS — Z7901 Long term (current) use of anticoagulants: Secondary | ICD-10-CM

## 2013-07-06 DIAGNOSIS — I4891 Unspecified atrial fibrillation: Secondary | ICD-10-CM

## 2013-07-06 DIAGNOSIS — I482 Chronic atrial fibrillation, unspecified: Secondary | ICD-10-CM

## 2013-07-12 ENCOUNTER — Encounter (HOSPITAL_COMMUNITY): Payer: Medicare Other

## 2013-07-12 ENCOUNTER — Ambulatory Visit: Payer: Medicare Other | Admitting: Family

## 2013-07-12 ENCOUNTER — Other Ambulatory Visit (HOSPITAL_COMMUNITY): Payer: Medicare Other

## 2013-07-18 ENCOUNTER — Other Ambulatory Visit (HOSPITAL_COMMUNITY): Payer: Self-pay | Admitting: Oncology

## 2013-07-18 DIAGNOSIS — D45 Polycythemia vera: Secondary | ICD-10-CM

## 2013-07-18 MED ORDER — HYDROXYUREA 500 MG PO CAPS: ORAL_CAPSULE | ORAL | Status: DC

## 2013-07-22 ENCOUNTER — Encounter (HOSPITAL_COMMUNITY): Payer: Medicare Other | Attending: Oncology

## 2013-07-22 ENCOUNTER — Other Ambulatory Visit (HOSPITAL_COMMUNITY): Payer: Self-pay | Admitting: Oncology

## 2013-07-22 DIAGNOSIS — D45 Polycythemia vera: Secondary | ICD-10-CM

## 2013-07-22 LAB — COMPREHENSIVE METABOLIC PANEL
ALT: 10 U/L (ref 0–35)
AST: 17 U/L (ref 0–37)
Alkaline Phosphatase: 97 U/L (ref 39–117)
BUN: 18 mg/dL (ref 6–23)
CO2: 25 mEq/L (ref 19–32)
Calcium: 9.7 mg/dL (ref 8.4–10.5)
Chloride: 102 mEq/L (ref 96–112)
GFR calc non Af Amer: 67 mL/min — ABNORMAL LOW (ref >90)
Glucose, Bld: 97 mg/dL (ref 70–99)
Potassium: 4.4 mEq/L (ref 3.5–5.1)
Sodium: 138 mEq/L (ref 135–145)
Total Bilirubin: 1.1 mg/dL (ref 0.3–1.2)
Total Protein: 7.5 g/dL (ref 6.0–8.3)

## 2013-07-22 LAB — CBC WITH DIFFERENTIAL/PLATELET
Basophils Relative: 2 % — ABNORMAL HIGH (ref 0–1)
Eosinophils Absolute: 0 10*3/uL (ref 0.0–0.7)
Eosinophils Relative: 0 % (ref 0–5)
Hemoglobin: 16.1 g/dL — ABNORMAL HIGH (ref 12.0–15.0)
Lymphocytes Relative: 19 % (ref 12–46)
Lymphs Abs: 0.9 10*3/uL (ref 0.7–4.0)
MCV: 118.2 fL — ABNORMAL HIGH (ref 78.0–100.0)
Monocytes Relative: 4 % (ref 3–12)
Neutro Abs: 3.5 10*3/uL (ref 1.7–7.7)
Neutrophils Relative %: 75 % (ref 43–77)
Platelets: 384 10*3/uL (ref 150–400)
RBC: 4.01 MIL/uL (ref 3.87–5.11)
WBC: 4.7 10*3/uL (ref 4.0–10.5)

## 2013-07-22 MED ORDER — HYDROXYUREA 500 MG PO CAPS: ORAL_CAPSULE | ORAL | Status: DC

## 2013-07-22 NOTE — Progress Notes (Signed)
Charlotte [name] presented for labwork. Labs per MD order drawn via Peripheral Line 23 gauge needle inserted in LT AC  Good blood return present. Procedure without incident.  Needle removed intact. Patient tolerated procedure well.

## 2013-08-03 ENCOUNTER — Ambulatory Visit (INDEPENDENT_AMBULATORY_CARE_PROVIDER_SITE_OTHER): Payer: Medicare Other | Admitting: *Deleted

## 2013-08-03 DIAGNOSIS — Z7901 Long term (current) use of anticoagulants: Secondary | ICD-10-CM

## 2013-08-03 DIAGNOSIS — I4891 Unspecified atrial fibrillation: Secondary | ICD-10-CM

## 2013-08-03 DIAGNOSIS — I482 Chronic atrial fibrillation, unspecified: Secondary | ICD-10-CM

## 2013-08-03 LAB — POCT INR: INR: 2.2

## 2013-08-09 DIAGNOSIS — Z6825 Body mass index (BMI) 25.0-25.9, adult: Secondary | ICD-10-CM | POA: Diagnosis not present

## 2013-08-09 DIAGNOSIS — I4891 Unspecified atrial fibrillation: Secondary | ICD-10-CM | POA: Diagnosis not present

## 2013-08-09 DIAGNOSIS — E039 Hypothyroidism, unspecified: Secondary | ICD-10-CM | POA: Diagnosis not present

## 2013-08-12 ENCOUNTER — Encounter: Payer: Self-pay | Admitting: *Deleted

## 2013-08-12 ENCOUNTER — Encounter: Payer: Self-pay | Admitting: Cardiovascular Disease

## 2013-08-12 ENCOUNTER — Ambulatory Visit (INDEPENDENT_AMBULATORY_CARE_PROVIDER_SITE_OTHER): Payer: Medicare Other | Admitting: Cardiovascular Disease

## 2013-08-12 VITALS — BP 140/99 | HR 80 | Ht 72.0 in | Wt 188.0 lb

## 2013-08-12 DIAGNOSIS — R0602 Shortness of breath: Secondary | ICD-10-CM | POA: Diagnosis not present

## 2013-08-12 DIAGNOSIS — I7092 Chronic total occlusion of artery of the extremities: Secondary | ICD-10-CM

## 2013-08-12 DIAGNOSIS — R079 Chest pain, unspecified: Secondary | ICD-10-CM

## 2013-08-12 DIAGNOSIS — I482 Chronic atrial fibrillation, unspecified: Secondary | ICD-10-CM

## 2013-08-12 DIAGNOSIS — I10 Essential (primary) hypertension: Secondary | ICD-10-CM | POA: Diagnosis not present

## 2013-08-12 DIAGNOSIS — I4891 Unspecified atrial fibrillation: Secondary | ICD-10-CM

## 2013-08-12 MED ORDER — ISOSORBIDE DINITRATE 10 MG PO TABS: 10.0000 mg | ORAL_TABLET | Freq: Three times a day (TID) | ORAL | Status: DC

## 2013-08-12 NOTE — Patient Instructions (Addendum)
Your physician recommends that you schedule a follow-up appointment in: [place] has recommended you make the following change in your medication:   1) START TAKING ISORDIL 10MG  [place]   Your physician has requested that you have a lexiscan myoview. For further information please visit [email or web address]. Please follow instruction sheet, as given.  WE WILL CALL YOU WITH YOUR TEST RESULTS/INSTRUCTIONS/NEXT STEPS ONCE RECEIVED BY THE PROVIDER  PLEASE BE ADVISED YOU WILL STILL NEED TO KEEP YOUR FOLLOW UP APPOINTMENT TO DISCUSS FURTHER DETAILS OF YOUR TEST RESULTS/NEXT STEPS/FUTURE PLAN OF CARE WITH YOUR PROVIDER DESPITE THE FACT THAT YOU MAY HAVE NORMAL TEST RESULTS

## 2013-08-12 NOTE — Progress Notes (Signed)
Patient ID: Charlotte [name], female   DOB: 1941/07/07, 73 y.o.   MRN: [id number]      SUBJECTIVE: Mrs. Fayer is a 73 yr old woman with a PMH significant for permanent atrial fibrillation, polycythemia vera, HTN, and PVD with a h/o right fem-pop bypass and endarterectomy.  Echocardiogram from 12/2011 revealed normal LV systolic function, EF 0000000, mild MR, and moderate LAE/mild RAE. She has been experiencing chest pain behind her left breast which may last 30-40 seconds at a time. It may occur anywhere from 2-3 times per week. It can occur both with and without exertion. She describes it as a throbbing sensation and says it is similar to a toothache. She's also had associated shortness of breath. She denies leg swelling. These symptoms have been going on for the past 2 months.     No Known Allergies  Current Outpatient Prescriptions  Medication Sig Dispense Refill  . aspirin EC 81 MG tablet Take 81 mg by mouth daily.      Marland Kitchen diltiazem (DILACOR XR) 180 MG 24 hr capsule Take 180 mg by mouth daily.      Marland Kitchen gabapentin (NEURONTIN) 100 MG capsule Take 100 mg by mouth 3 (three) times daily.        . hydroxyurea (HYDREA) 500 MG capsule Take 500mg  in AM and 1000 mg in PM PO M-F and 1000 mg BID on Saturday and Sunday  100 capsule  2  . levothyroxine (SYNTHROID, LEVOTHROID) 50 MCG tablet Take 75 mcg by mouth daily before breakfast.       . metoprolol (TOPROL-XL) 100 MG 24 hr tablet Take 100 mg by mouth 2 (two) times daily.        Marland Kitchen warfarin (COUMADIN) 5 MG tablet Take 1 tablet (5 mg total) by mouth daily.  30 tablet  3   No current facility-administered medications for this visit.    Past Medical History  Diagnosis Date  . Hypertension   . Arthritis   . Varicose veins   . Deaf   . Elevated WBC count     and platelets  . Thyroid disease   . Irregular heart rate   . 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 Peripheral neuropathy     feet  . 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 Atrial fibrillation,  chronic   . 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 Peripheral vascular disease     Past Surgical History  Procedure Laterality Date  . Blt    . Abdominal hysterectomy    . Coronary angioplasty    . Ct of abd    . Hip pinning,cannulated  11/19/2011    Procedure: CANNULATED HIP PINNING;  Surgeon: Sanjuana Kava, MD;  Location: [facility];  Service: Orthopedics;  Laterality: Right;  . Fasciotomy  11/29/2011    Procedure: FASCIOTOMY;  Surgeon: Carole Civil, MD;  Location: [facility];  Service: Orthopedics;  Laterality: Right;  right thigh   . Dressing change under anesthesia  12/02/2011    Procedure: DRESSING CHANGE UNDER ANESTHESIA;  Surgeon: Carole Civil, MD;  Location: [facility];  Service: Orthopedics;  Laterality: Right;  . Endarterectomy femoral Right 09/15/2012    Procedure: ENDARTERECTOMY FEMORAL;  Surgeon: Mal Misty, MD;  Location: [place];  Service: Vascular;  Laterality: Right;  . Femoral-popliteal bypass graft Right 09/15/2012    Procedure: BYPASS GRAFT FEMORAL-POPLITEAL ARTERY;  Surgeon: Mal Misty, MD;  Location: [place];  Service: Vascular;  Laterality: Right;  . Patch angioplasty Right 09/15/2012  Procedure: PATCH ANGIOPLASTY;  Surgeon: Mal Misty, MD;  Location: [place];  Service: Vascular;  Laterality: Right;    History   Social History  . Marital Status: Married    Spouse Name: N/A    Number of Children: N/A  . Years of Education: N/A   Occupational History  . Not on file.   Social History Main Topics  . Smoking status: Never Smoker   . Smokeless tobacco: Never Used  . Alcohol Use: No  . Drug Use: No  . Sexual Activity: No   Other Topics Concern  . Not on file   Social History Narrative  . No narrative on file     Filed Vitals:   08/12/13 1556  BP: 140/99  Pulse: 80  Height: 6' (1.829 m)  Weight: 188 lb (85.276 kg)    PHYSICAL EXAM General: NAD Neck: No JVD, no thyromegaly or thyroid nodule.  Lungs: Clear to auscultation  bilaterally with normal respiratory effort. CV: Nondisplaced PMI.  Heart regular S1/S2, no S3/S4, no murmur.  No peripheral edema.  No carotid bruit.  Normal pedal pulses.  Abdomen: Soft, nontender, no hepatosplenomegaly, no distention.  Neurologic: Alert and oriented x 3.  Psych: Normal affect. Extremities: No clubbing or cyanosis.   ECG: reviewed and available in electronic records.      ASSESSMENT AND PLAN: 1. Permanent atrial fibrillation: rate is controlled on current doses of metoprolol and diltiazem. Will continue warfarin. 2. HTN: borderline elevation of systolic pressure, with diastolic pressure being mildly elevated. I will monitor this. 3. PVD: follows closely with vascular surgery. 4. Chest pain and shortness of breath: Given her risk factors for coronary artery disease (HTN, PVD), I will proceed with a Lexiscan Cardiolite stress test to evaluate for inducible ischemia. I will also initiate Isordil 10 mg 3 times daily.  Dispo: f/u 1 month.  Kate Sable, M.D., F.A.C.C.

## 2013-08-17 ENCOUNTER — Encounter (HOSPITAL_COMMUNITY)
Admission: RE | Admit: 2013-08-17 | Discharge: 2013-08-17 | Disposition: A | Discharge status: Home / Self Care | Payer: Medicare Other | Source: Ambulatory Visit | Attending: Cardiovascular Disease | Admitting: Cardiovascular Disease

## 2013-08-17 ENCOUNTER — Encounter (HOSPITAL_COMMUNITY): Payer: Self-pay

## 2013-08-17 DIAGNOSIS — R0602 Shortness of breath: Secondary | ICD-10-CM | POA: Diagnosis not present

## 2013-08-17 DIAGNOSIS — R0609 Other forms of dyspnea: Secondary | ICD-10-CM | POA: Diagnosis not present

## 2013-08-17 DIAGNOSIS — R079 Chest pain, unspecified: Secondary | ICD-10-CM | POA: Diagnosis not present

## 2013-08-17 DIAGNOSIS — I4891 Unspecified atrial fibrillation: Secondary | ICD-10-CM | POA: Diagnosis not present

## 2013-08-17 DIAGNOSIS — I10 Essential (primary) hypertension: Secondary | ICD-10-CM | POA: Diagnosis not present

## 2013-08-17 DIAGNOSIS — I739 Peripheral vascular disease, unspecified: Secondary | ICD-10-CM | POA: Insufficient documentation

## 2013-08-17 DIAGNOSIS — R0989 Other specified symptoms and signs involving the circulatory and respiratory systems: Secondary | ICD-10-CM | POA: Insufficient documentation

## 2013-08-17 MED ORDER — SODIUM CHLORIDE 0.9 % IJ SOLN
INTRAMUSCULAR | Status: AC
  Administered 2013-08-17: 10 mL via INTRAVENOUS
  Filled 2013-08-17: qty 10

## 2013-08-17 MED ORDER — TECHNETIUM TC 99M SESTAMIBI - CARDIOLITE
30.0000 | Freq: Once | INTRAVENOUS | Status: AC | PRN
  Administered 2013-08-17: 30 via INTRAVENOUS

## 2013-08-17 MED ORDER — TECHNETIUM TC 99M SESTAMIBI - CARDIOLITE
10.0000 | Freq: Once | INTRAVENOUS | Status: AC | PRN
  Administered 2013-08-17: 08:00:00 10 via INTRAVENOUS

## 2013-08-17 MED ORDER — REGADENOSON 0.4 MG/5ML IV SOLN
INTRAVENOUS | Status: AC
  Administered 2013-08-17: 0.4 mg via INTRAVENOUS
  Filled 2013-08-17: qty 5

## 2013-08-17 NOTE — Progress Notes (Signed)
Stress Lab Nurses Notes - [facility]  TALITA MATIN 08/17/2013 Reason for doing test: Chest Pain and Dyspnea Type of test: Wille Glaser Nurse performing test: Gerrit Halls, RN Nuclear Medicine Tech: Melburn Hake Echo Tech: Not Applicable MD performing test: Dr. Clearance Coots.Bonnell Public PA Family MD: Dr. Hilma Favors Test explained and consent signed: yes IV started: 22g jelco, Saline lock flushed, No redness or edema and Saline lock started in radiology Symptoms: SOB Treatment/Intervention: None Reason test stopped: protocol completed After recovery IV was: Discontinued via X-ray tech and No redness or edema Patient to return to [facility]. Med at : 9:30 Patient discharged: Home Patient's Condition upon discharge was: stable Comments: Pre test BP 134/105 . During test BP 146/51 & HR 85.  Recovery BP 139/89 & HR 74.  Symptoms resolved in recovery. Geanie Cooley T

## 2013-08-19 ENCOUNTER — Encounter (HOSPITAL_COMMUNITY): Payer: Medicare Other | Attending: Oncology

## 2013-08-19 DIAGNOSIS — D45 Polycythemia vera: Secondary | ICD-10-CM | POA: Diagnosis not present

## 2013-08-19 LAB — CBC WITH DIFFERENTIAL/PLATELET
Basophils Absolute: 0.1 10*3/uL (ref 0.0–0.1)
Basophils Relative: 1 % (ref 0–1)
EOS ABS: 0 10*3/uL (ref 0.0–0.7)
EOS PCT: 0 % (ref 0–5)
HEMATOCRIT: 42.1 % (ref 36.0–46.0)
Hemoglobin: 15 g/dL (ref 12.0–15.0)
Lymphocytes Relative: 24 % (ref 12–46)
Lymphs Abs: 1.2 10*3/uL (ref 0.7–4.0)
MCH: 43.1 pg — AB (ref 26.0–34.0)
MCHC: 35.6 g/dL (ref 30.0–36.0)
MCV: 121 fL — AB (ref 78.0–100.0)
MONO ABS: 0.1 10*3/uL (ref 0.1–1.0)
Monocytes Relative: 2 % — ABNORMAL LOW (ref 3–12)
Neutro Abs: 3.8 10*3/uL (ref 1.7–7.7)
Neutrophils Relative %: 73 % (ref 43–77)
Platelets: 129 10*3/uL — ABNORMAL LOW (ref 150–400)
RBC: 3.48 MIL/uL — ABNORMAL LOW (ref 3.87–5.11)
RDW: 17.1 % — AB (ref 11.5–15.5)
WBC: 5.2 10*3/uL (ref 4.0–10.5)

## 2013-08-19 LAB — COMPREHENSIVE METABOLIC PANEL
ALT: 9 U/L (ref 0–35)
AST: 17 U/L (ref 0–37)
Albumin: 3.4 g/dL — ABNORMAL LOW (ref 3.5–5.2)
Alkaline Phosphatase: 93 U/L (ref 39–117)
BILIRUBIN TOTAL: 1.2 mg/dL (ref 0.3–1.2)
BUN: 21 mg/dL (ref 6–23)
CALCIUM: 9.1 mg/dL (ref 8.4–10.5)
CHLORIDE: 105 meq/L (ref 96–112)
CO2: 25 meq/L (ref 19–32)
CREATININE: 0.87 mg/dL (ref 0.50–1.10)
GFR, EST AFRICAN AMERICAN: 76 mL/min — AB (ref >90)
GFR, EST NON AFRICAN AMERICAN: 66 mL/min — AB (ref >90)
GLUCOSE: 94 mg/dL (ref 70–99)
Potassium: 4.4 mEq/L (ref 3.7–5.3)
Sodium: 142 mEq/L (ref 137–147)
Total Protein: 6.6 g/dL (ref 6.0–8.3)

## 2013-08-19 LAB — RETICULOCYTES
RBC.: 3.48 MIL/uL — ABNORMAL LOW (ref 3.87–5.11)
Retic Count, Absolute: 87 10*3/uL (ref 19.0–186.0)
Retic Ct Pct: 2.5 % (ref 0.4–3.1)

## 2013-08-19 LAB — LACTATE DEHYDROGENASE: LDH: 326 U/L — AB (ref 94–250)

## 2013-08-19 NOTE — Progress Notes (Signed)
Labs drawn today for cbc/diff,retic,cmp,ldh

## 2013-08-22 ENCOUNTER — Encounter (HOSPITAL_COMMUNITY): Payer: Self-pay

## 2013-08-22 ENCOUNTER — Encounter (HOSPITAL_BASED_OUTPATIENT_CLINIC_OR_DEPARTMENT_OTHER): Payer: Medicare Other

## 2013-08-22 ENCOUNTER — Ambulatory Visit (HOSPITAL_COMMUNITY)
Admission: RE | Admit: 2013-08-22 | Discharge: 2013-08-22 | Disposition: A | Discharge status: Home / Self Care | Payer: Medicare Other | Source: Ambulatory Visit | Attending: Hematology and Oncology | Admitting: Hematology and Oncology

## 2013-08-22 VITALS — BP 124/81 | HR 73 | Temp 97.1°F | Resp 18 | Wt 187.1 lb

## 2013-08-22 DIAGNOSIS — D45 Polycythemia vera: Secondary | ICD-10-CM

## 2013-08-22 DIAGNOSIS — R0602 Shortness of breath: Secondary | ICD-10-CM | POA: Diagnosis not present

## 2013-08-22 NOTE — Progress Notes (Signed)
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 polycythemia vera JAK-2 positive    Hydrea therapy    CURRENT THERAPY: Hydrea 571m AM,1000 mg p.m. for 5 days, 1000 mg daily for 2 days out of every week.  INTERVAL HISTORY: Charlotte VENTOLA73y.o. female returns for followup of P. Vera, JAK-2 positive, while taking Hydrea. She is markedly hard of hearing. She has had increased shortness of breath over the past month and was evaluated with a stress test recently with negative findings. She does suffer with chronic atrial fibrillation. Appetite is good with no nausea or vomiting. She denies diarrhea, constipation, melena, hematochezia, hematuria, easy bruising, epistaxis, cough, wheezing, chest pain, PND, orthopnea, palpitations, expectoration, chronic cough, wheezing, or heartburn symptoms.  MEDICAL HISTORY: Past Medical History  Diagnosis Date  . Hypertension   . Arthritis   . Varicose veins   . Deaf   . Elevated WBC count     and platelets  . Thyroid disease   . Irregular heart rate   . 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 Peripheral neuropathy     feet  . 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 Atrial fibrillation, chronic   . 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    ALLERGIES:  has No Known Allergies.  MEDICATIONS: has a current medication list which includes the following prescription(s): aspirin ec, diltiazem, gabapentin, hydroxyurea, levothyroxine, metoprolol succinate, warfarin, and isosorbide dinitrate.  SURGICAL HISTORY:  Past Surgical History  Procedure Laterality Date  . Blt    . Abdominal hysterectomy    . Coronary angioplasty    . Ct of abd    . Hip pinning,cannulated  11/19/2011    Procedure: CANNULATED HIP PINNING;  Surgeon: WSanjuana Kava MD;  Location: [facility];  Service: Orthopedics;  Laterality: Right;  . Fasciotomy  11/29/2011    Procedure: FASCIOTOMY;  Surgeon: SCarole Civil MD;  Location: [facility];  Service: Orthopedics;  Laterality: Right;  right thigh   . Dressing change under anesthesia  12/02/2011    Procedure: DRESSING CHANGE UNDER ANESTHESIA;  Surgeon: SCarole Civil MD;  Location: [facility];  Service: Orthopedics;  Laterality: Right;  . Endarterectomy femoral Right 09/15/2012    Procedure: ENDARTERECTOMY FEMORAL;  Surgeon: JMal Misty MD;  Location: [facility]  Service: Vascular;  Laterality: Right;  . Femoral-popliteal bypass graft Right 09/15/2012    Procedure: BYPASS GRAFT FEMORAL-POPLITEAL ARTERY;  Surgeon: JMal Misty MD;  Location[facility]  Service: Vascular;  Laterality: Right;  . Patch angioplasty Right 09/15/2012    Procedure: PATCH ANGIOPLASTY;  Surgeon: JMal Misty MD;  [facility]  Service: Vascular;  Laterality: Right;    FAMILY  HISTORY: family history includes Diabetes in her son.  SOCIAL HISTORY:  reports that she has never smoked. She has never used smokeless tobacco. She reports that she does not drink alcohol or use illicit drugs.  REVIEW OF  SYSTEMS:  Other than that discussed above is noncontributory.  PHYSICAL EXAMINATION: ECOG PERFORMANCE STATUS: 1 - Symptomatic but completely ambulatory  Blood pressure 124/81, pulse 73, temperature 97.1 F (36.2 C), temperature source Oral, resp. rate 18, weight 187 lb 1.6 oz (84.868 kg), SpO2 99.00%.  GENERAL:alert, no distress and comfortable SKIN: skin color, texture, turgor are normal, no rashes or significant lesions EYES: PERLA; Conjunctiva are pink and non-injected, sclera clear OROPHARYNX:no exudate, no erythema on lips, buccal mucosa, or tongue. NECK: supple, thyroid normal size, non-tender, without nodularity. No masses CHEST: normal AP diameter with no breast masses. LYMPH:  no palpable lymphadenopathy in the cervical, axillary or inguinal LUNGS: clear to auscultation and percussion with normal breathing effort HEART:irregularly irregular with no S3. ABDOMEN:abdomen soft, non-tender and normal bowel sounds. Spleen not palpable. MUSCULOSKELETAL:no cyanosis of digits and no clubbing. Range of motion normal.  NEURO: alert & oriented x 3 with fluent speech, no focal motor/sensory deficits   LABORATORY DATA: Infusion on 08/19/2013  Component Date Value Range Status  . WBC 08/19/2013 5.2  4.0 - 10.5 K/uL Final  . RBC 08/19/2013 3.48* 3.87 - 5.11 MIL/uL Final  . Hemoglobin 08/19/2013 15.0  12.0 - 15.0 g/dL Final  . HCT 08/19/2013 42.1  36.0 - 46.0 % Final  . MCV 08/19/2013 121.0* 78.0 - 100.0 fL Final  . MCH 08/19/2013 43.1* 26.0 - 34.0 pg Final  . MCHC 08/19/2013 35.6  30.0 - 36.0 g/dL Final  . RDW 08/19/2013 17.1* 11.5 - 15.5 % Final  . Platelets 08/19/2013 129* 150 - 400 K/uL Final  . Neutrophils Relative % 08/19/2013 73  43 - 77 % Final  . Neutro Abs 08/19/2013 3.8  1.7 - 7.7 K/uL Final  . Lymphocytes Relative 08/19/2013 24  12 - 46 % Final  . Lymphs Abs 08/19/2013 1.2  0.7 - 4.0 K/uL Final  . Monocytes Relative 08/19/2013 2* 3 - 12 % Final  . Monocytes Absolute  08/19/2013 0.1  0.1 - 1.0 K/uL Final  . Eosinophils Relative 08/19/2013 0  0 - 5 % Final  . Eosinophils Absolute 08/19/2013 0.0  0.0 - 0.7 K/uL Final  . Basophils Relative 08/19/2013 1  0 - 1 % Final  . Basophils Absolute 08/19/2013 0.1  0.0 - 0.1 K/uL Final  . Retic Ct Pct 08/19/2013 2.5  0.4 - 3.1 % Final  . RBC. 08/19/2013 3.48* 3.87 - 5.11 MIL/uL Final  . Retic Count, Manual 08/19/2013 87.0  19.0 - 186.0 K/uL Final  . Sodium 08/19/2013 142  137 - 147 mEq/L Final  . Potassium 08/19/2013 4.4  3.7 - 5.3 mEq/L Final  . Chloride 08/19/2013 105  96 - 112 mEq/L Final  . CO2 08/19/2013 25  19 - 32 mEq/L Final  . Glucose, Bld 08/19/2013 94  70 - 99 mg/dL Final  . BUN 08/19/2013 21  6 - 23 mg/dL Final  . Creatinine, Ser 08/19/2013 0.87  0.50 - 1.10 mg/dL Final  . Calcium 08/19/2013 9.1  8.4 - 10.5 mg/dL Final  . Total Protein 08/19/2013 6.6  6.0 - 8.3 g/dL Final  . Albumin 08/19/2013 3.4* 3.5 - 5.2 g/dL Final  . AST 08/19/2013 17  0 - 37 U/L Final  . ALT 08/19/2013 9  0 - 35 U/L  Final  . Alkaline Phosphatase 08/19/2013 93  39 - 117 U/L Final  . Total Bilirubin 08/19/2013 1.2  0.3 - 1.2 mg/dL Final  . GFR calc non Af Amer 08/19/2013 66* >90 mL/min Final  . GFR calc Af Amer 08/19/2013 76* >90 mL/min Final   Comment: (NOTE)                          The eGFR has been calculated using the CKD EPI equation.                          This calculation has not been validated in all clinical situations.                          eGFR's persistently <90 mL/min signify possible Chronic Kidney                          Disease.  Marland Kitchen LDH 08/19/2013 326* 94 - 250 U/L Final  Anti-coag visit on 08/03/2013  Component Date Value Range Status  . INR 08/03/2013 2.2   Final    PATHOLOGY:peripheral smear failed to reveal evidence of premature forms.  Urinalysis    Component Value Date/Time   COLORURINE YELLOW 09/14/2012 2337   APPEARANCEUR CLEAR 09/14/2012 2337   LABSPEC 1.020 09/14/2012 2337   PHURINE 6.5  09/14/2012 2337   GLUCOSEU NEGATIVE 09/14/2012 2337   HGBUR NEGATIVE 09/14/2012 2337   BILIRUBINUR NEGATIVE 09/14/2012 2337   KETONESUR NEGATIVE 09/14/2012 2337   PROTEINUR NEGATIVE 09/14/2012 2337   UROBILINOGEN 1.0 09/14/2012 2337   NITRITE NEGATIVE 09/14/2012 2337   LEUKOCYTESUR NEGATIVE 09/14/2012 2337    RADIOGRAPHIC STUDIES: Nm Myocar Single W/spect W/wall Motion And Ef  08/17/2013   CLINICAL DATA:  73 year old woman with history of atrial fibrillation, hypertension, peripheral arterial disease, now referred with symptoms of chest pain and shortness of breath for the assessment of ischemia.  EXAM: MYOCARDIAL IMAGING WITH SPECT (REST AND PHARMACOLOGIC-STRESS)  GATED LEFT VENTRICULAR WALL MOTION STUDY  LEFT VENTRICULAR EJECTION FRACTION  TECHNIQUE: Standard myocardial SPECT imaging was performed after resting intravenous injection of 10 mCi [id number]. Subsequently, intravenous infusion of Lexiscan was performed under the supervision of the Cardiology staff. At peak effect of the drug, 30 mCi [id number] was injected intravenously and standard myocardial SPECT imaging was performed. Quantitative gated imaging was also performed to evaluate left ventricular wall motion, and estimate left ventricular ejection fraction.  COMPARISON:  None.  FINDINGS: Baseline tracing shows atrial fibrillation at 77 beats per min with PVCs verses aberrantly conducted complexes, nonspecific ST-T changes. Lexiscan bolus was given in standard fashion. Heart rate increased from 72 beats per min up to 90 beats per min, and blood pressure changed from 134/105 to 151/87. No chest pain was reported. No diagnostic ST segment abnormalities were noted by standard criteria.  Analysis of the overall perfusion data finds breast attenuation.  Tomographic views were obtained using the short axis, vertical long axis, and horizontal long axis planes. There is a small, mild to moderate intensity, anteroseptal defect that is fixed and  consistent with soft tissue attenuation. No reversible defects to indicate ischemia.  Gated imaging reveals an EDV of 63, ESV of 24, TID ratio of 1.03, and LVEF of 61% without wall motion abnormalities.  IMPRESSION: Low risk Lexiscan Cardiolite. There were no diagnostic ST segment abnormalities, atrial fibrillation  persistent throughout. Perfusion imaging is consistent with breast attenuation affecting the anteroseptal wall toward the apex, no clear evidence of scar or ischemia. LV volumes are normal with normal LVEF of 61%.   Electronically Signed   By: Rozann Lesches M.D.   On: 08/17/2013 15:21    ASSESSMENT:  #1. Polycythemia vera, currently on Hydrea, now with thrombocytopenia. #2. Increasing shortness of breath, rule out pulmonary pathology. #3. Chronic atrial fibrillation, controlled ventricular response. #4. Hypothyroidism, on treatment. #5. Peripheral vascular disease, status post femoral endarterectomy and femorals-popliteal bypass. #6. Severe hearing deficit.   PLAN:  #1. Chest x-ray PA and lateral today. #2. Pulmonary function tests with DLCO.patient was told to call after testing is completed for discussion of the results. #3. Change Hydrea dose to 500 mg in the morning, 1035m in the evening 7 days a week.. #4. Followup in 3 months with CBC.   All questions were answered. The patient knows to call the clinic with any problems, questions or concerns. We can certainly see the patient much sooner if necessary.   I spent 25 minutes counseling the patient face to face. The total time spent in the appointment was 30 minutes.    FDoroteo Bradford MD 08/22/2013 12:06 PM

## 2013-08-22 NOTE — Patient Instructions (Signed)
[facility] Discharge Instructions  RECOMMENDATIONS MADE BY THE CONSULTANT AND ANY TEST RESULTS WILL BE SENT TO YOUR REFERRING PHYSICIAN.  EXAM FINDINGS BY THE PHYSICIAN TODAY AND SIGNS OR SYMPTOMS TO REPORT TO CLINIC OR PRIMARY PHYSICIAN: Exam and findings as discussed by Dr. Barnet Glasgow.  Will have you get a chest xray as you leave today and will get you scheduled for Pulmonary Function Tests.  Report unusual bruising or bleeding.  MEDICATIONS PRESCRIBED:  Hydrea change dosage to 500 mg every morning and 1000 mg every evening.  Take this amount on the weekend as well.  INSTRUCTIONS/FOLLOW-UP: Pulmonary Function Test on 1/27 at 10:30am - on 4th floor (same location as the [facility]) Blood work and follow-up in 3 months.  Thank you for choosing [facility] to provide your oncology and hematology care.  To afford each patient quality time with our providers, please arrive at least 15 minutes before your scheduled appointment time.  With your help, our goal is to use those 15 minutes to complete the necessary work-up to ensure our physicians have the information they need to help with your evaluation and healthcare recommendations.    Effective January 1st, 2014, we ask that you re-schedule your appointment with our physicians should you arrive 10 or more minutes late for your appointment.  We strive to give you quality time with our providers, and arriving late affects you and other patients whose appointments are after yours.    Again, thank you for choosing [facility].  Our hope is that these requests will decrease the amount of time that you wait before being seen by our physicians.       _____________________________________________________________  Should you have questions after your visit to [facility], please contact our office at [phone] between the hours of 8:30 a.m. and 5:00 p.m.  Voicemails left after 4:30 p.m.  will not be returned until the following business day.  For prescription refill requests, have your pharmacy contact our office with your prescription refill request.

## 2013-08-30 ENCOUNTER — Ambulatory Visit (HOSPITAL_COMMUNITY)
Admission: RE | Admit: 2013-08-30 | Discharge: 2013-08-30 | Disposition: A | Discharge status: Home / Self Care | Payer: Medicare Other | Source: Ambulatory Visit | Attending: Hematology and Oncology | Admitting: Hematology and Oncology

## 2013-08-30 DIAGNOSIS — D45 Polycythemia vera: Secondary | ICD-10-CM

## 2013-08-30 DIAGNOSIS — R0602 Shortness of breath: Secondary | ICD-10-CM | POA: Insufficient documentation

## 2013-08-30 LAB — PULMONARY FUNCTION TEST
DL/VA % PRED: 42 %
DL/VA: 2.28 ml/min/mmHg/L
DLCO UNC % PRED: 34 %
DLCO UNC: 11.24 ml/min/mmHg
DLCO cor % pred: 34 %
DLCO cor: 11.24 ml/min/mmHg
FEF 25-75 POST: 1.88 L/s
FEF 25-75 Pre: 1.34 L/sec
FEF2575-%Change-Post: 40 %
FEF2575-%PRED-POST: 84 %
FEF2575-%Pred-Pre: 60 %
FEV1-%CHANGE-POST: 11 %
FEV1-%PRED-POST: 93 %
FEV1-%Pred-Pre: 83 %
FEV1-POST: 2.64 L
FEV1-Pre: 2.38 L
FEV1FVC-%Change-Post: 12 %
FEV1FVC-%Pred-Pre: 87 %
FEV6-%Change-Post: 1 %
FEV6-%PRED-PRE: 97 %
FEV6-%Pred-Post: 98 %
FEV6-POST: 3.53 L
FEV6-Pre: 3.48 L
FEV6FVC-%CHANGE-POST: 2 %
FEV6FVC-%PRED-POST: 103 %
FEV6FVC-%PRED-PRE: 101 %
FVC-%Change-Post: 0 %
FVC-%PRED-PRE: 95 %
FVC-%Pred-Post: 94 %
FVC-PRE: 3.57 L
FVC-Post: 3.54 L
Post FEV1/FVC ratio: 75 %
Post FEV6/FVC ratio: 100 %
Pre FEV1/FVC ratio: 66 %
Pre FEV6/FVC Ratio: 97 %
RV % PRED: 105 %
RV: 2.65 L
TLC % pred: 101 %
TLC: 6.06 L

## 2013-08-30 MED ORDER — ALBUTEROL SULFATE (2.5 MG/3ML) 0.083% IN NEBU
2.5000 mg | INHALATION_SOLUTION | Freq: Once | RESPIRATORY_TRACT | Status: AC
  Administered 2013-08-30: 2.5 mg via RESPIRATORY_TRACT

## 2013-08-31 ENCOUNTER — Ambulatory Visit (INDEPENDENT_AMBULATORY_CARE_PROVIDER_SITE_OTHER): Payer: Medicare Other | Admitting: *Deleted

## 2013-08-31 DIAGNOSIS — Z5181 Encounter for therapeutic drug level monitoring: Secondary | ICD-10-CM | POA: Diagnosis not present

## 2013-08-31 DIAGNOSIS — I4891 Unspecified atrial fibrillation: Secondary | ICD-10-CM | POA: Diagnosis not present

## 2013-08-31 DIAGNOSIS — Z7901 Long term (current) use of anticoagulants: Secondary | ICD-10-CM | POA: Diagnosis not present

## 2013-08-31 DIAGNOSIS — I482 Chronic atrial fibrillation, unspecified: Secondary | ICD-10-CM

## 2013-08-31 LAB — POCT INR: INR: 2.1

## 2013-08-31 NOTE — Procedures (Signed)
NAMEEMMERSON, ZANELLI             ACCOUNT NO.:  [id number]  MEDICAL RECORD NO.:  [id number]  LOCATION:  RESP                          FACILITY:  [facility]  PHYSICIAN:  Keerthana Vanrossum L. Luan Pulling, M.D.DATE OF BIRTH:  Feb 01, 1941  DATE OF PROCEDURE: DATE OF DISCHARGE:  08/30/2013                           PULMONARY FUNCTION TEST   REASON FOR PULMONARY FUNCTION TESTING:  Polycythemia vera.  1. Spirometry shows no ventilatory defect, but evidence of airflow     obstruction. 2. Lung volumes are normal. 3. DLCO is severely reduced, but does correct somewhat when volume is     taken into account. 4. Airway resistance is normal. 5. There is significant bronchodilator improvement. 6. The study is consistent with ventilatory defect, which may be     related to airflow obstruction and clinical correlation is     suggested.     Robbyn Hodkinson L. Luan Pulling, M.D.     ELH/MEDQ  D:  08/30/2013  T:  08/31/2013  Job:  [id number]

## 2013-09-16 ENCOUNTER — Other Ambulatory Visit (HOSPITAL_COMMUNITY): Payer: Medicare Other

## 2013-09-19 ENCOUNTER — Ambulatory Visit (INDEPENDENT_AMBULATORY_CARE_PROVIDER_SITE_OTHER): Payer: Medicare Other | Admitting: Cardiovascular Disease

## 2013-09-19 ENCOUNTER — Ambulatory Visit: Payer: Medicare Other | Admitting: Cardiovascular Disease

## 2013-09-19 VITALS — BP 144/91 | HR 82 | Ht 72.0 in | Wt 190.5 lb

## 2013-09-19 DIAGNOSIS — R0602 Shortness of breath: Secondary | ICD-10-CM

## 2013-09-19 DIAGNOSIS — IMO0001 Reserved for inherently not codable concepts without codable children: Secondary | ICD-10-CM

## 2013-09-19 DIAGNOSIS — I4891 Unspecified atrial fibrillation: Secondary | ICD-10-CM | POA: Diagnosis not present

## 2013-09-19 DIAGNOSIS — Z7901 Long term (current) use of anticoagulants: Secondary | ICD-10-CM

## 2013-09-19 DIAGNOSIS — I10 Essential (primary) hypertension: Secondary | ICD-10-CM

## 2013-09-19 DIAGNOSIS — R079 Chest pain, unspecified: Secondary | ICD-10-CM

## 2013-09-19 DIAGNOSIS — I482 Chronic atrial fibrillation, unspecified: Secondary | ICD-10-CM

## 2013-09-19 DIAGNOSIS — I7092 Chronic total occlusion of artery of the extremities: Secondary | ICD-10-CM

## 2013-09-19 DIAGNOSIS — Z136 Encounter for screening for cardiovascular disorders: Secondary | ICD-10-CM

## 2013-09-19 DIAGNOSIS — R942 Abnormal results of pulmonary function studies: Secondary | ICD-10-CM

## 2013-09-19 MED ORDER — DILTIAZEM HCL ER COATED BEADS 240 MG PO CP24: 240.0000 mg | ORAL_CAPSULE | Freq: Every day | ORAL | Status: DC

## 2013-09-19 NOTE — Progress Notes (Signed)
Patient ID: Charlotte [name], female   DOB: 1941/04/24, 73 y.o.   MRN: [id number]      SUBJECTIVE: Charlotte [name] is a 73 yr old woman with a PMH significant for permanent atrial fibrillation, polycythemia vera, HTN, and PVD with a h/o right fem-pop bypass and endarterectomy.  Echocardiogram from 12/2011 revealed normal LV systolic function, EF 0000000, mild MR, and moderate LAE/mild RAE.  She had been experiencing chest pain and shortness of breath, and I ordered a stress test at her last visit and started isosorbide dinitrate. Since that time, she underwent PFT's which demonstrated a ventilatory defect with significantly reduced DLCO. Her nuclear stress test was normal. She stopped Isordil due to headaches. Her chest pain has since resolved. She experiences intermittent shortness of breath. Chest xray showed mild basilar scarring at the lateral right lung base and medial left lung base, but no evidence of acute cardiopulmonary disease.       No Known Allergies  Current Outpatient Prescriptions  Medication Sig Dispense Refill  . aspirin EC 81 MG tablet Take 81 mg by mouth daily.      Marland Kitchen diltiazem (DILACOR XR) 180 MG 24 hr capsule Take 180 mg by mouth daily.      Marland Kitchen gabapentin (NEURONTIN) 100 MG capsule Take 100 mg by mouth 3 (three) times daily.        . hydroxyurea (HYDREA) 500 MG capsule Take 500mg  in AM and 1000 mg in PM PO M-F and 1000 mg BID on Saturday and Sunday  100 capsule  2  . levothyroxine (SYNTHROID, LEVOTHROID) 50 MCG tablet Take 75 mcg by mouth daily before breakfast.       . metoprolol (TOPROL-XL) 100 MG 24 hr tablet Take 100 mg by mouth 2 (two) times daily.        Marland Kitchen warfarin (COUMADIN) 5 MG tablet Take 1 tablet (5 mg total) by mouth daily.  30 tablet  3  . isosorbide dinitrate (ISORDIL) 10 MG tablet Take 1 tablet (10 mg total) by mouth 3 (three) times daily.  90 tablet  6   No current facility-administered medications for this visit.    Past Medical History  Diagnosis Date   . Hypertension   . Arthritis   . Varicose veins   . Deaf   . Elevated WBC count     and platelets  . Thyroid disease   . Irregular heart rate   . 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 Peripheral neuropathy     feet  . 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 Atrial fibrillation, chronic   . 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 Peripheral vascular disease     Past Surgical History  Procedure Laterality Date  . Blt    . Abdominal hysterectomy    . Coronary angioplasty    . Ct of abd    . Hip pinning,cannulated  11/19/2011    Procedure: CANNULATED HIP PINNING;  Surgeon: Sanjuana Kava, MD;  Location: [facility];  Service: Orthopedics;  Laterality: Right;  . Fasciotomy  11/29/2011    Procedure: FASCIOTOMY;  Surgeon: Carole Civil, MD;  Location: [facility];  Service: Orthopedics;  Laterality: Right;  right thigh   . Dressing change under anesthesia  12/02/2011    Procedure: DRESSING CHANGE UNDER ANESTHESIA;  Surgeon: Carole Civil, MD;  Location: [facility];  Service: Orthopedics;  Laterality: Right;  . Endarterectomy femoral Right 09/15/2012    Procedure: ENDARTERECTOMY FEMORAL;  Surgeon: Mal Misty, MD;  Location: [facility];  Service:  Vascular;  Laterality: Right;  . Femoral-popliteal bypass graft Right 09/15/2012    Procedure: BYPASS GRAFT FEMORAL-POPLITEAL ARTERY;  Surgeon: Mal Misty, MD;  Location: [facility];  Service: Vascular;  Laterality: Right;  . Patch angioplasty Right 09/15/2012    Procedure: PATCH ANGIOPLASTY;  Surgeon: Mal Misty, MD;  Location: [facility];  Service: Vascular;  Laterality: Right;    History   Social History  . Marital Status: Married    Spouse Name: N/A    Number of Children: N/A  . Years of Education: N/A   Occupational History  . Not on file.   Social History Main Topics  . Smoking status: Never Smoker   . Smokeless tobacco: Never Used  . Alcohol Use: No  . Drug Use: No  . Sexual Activity: No   Other Topics Concern  . Not on file   Social  History Narrative  . No narrative on file     Filed Vitals:   09/19/13 1016  BP: 144/91  Pulse: 82  Height: 6' (1.829 m)  Weight: 190 lb 8 oz (86.41 kg)    PHYSICAL EXAM General: NAD Neck: No JVD, no thyromegaly or thyroid nodule.  Lungs: Clear to auscultation bilaterally with normal respiratory effort. CV: Nondisplaced PMI.  Heart regular S1/S2, no S3/S4, no murmur.  No peripheral edema.  No carotid bruit.  Normal pedal pulses.  Abdomen: Soft, nontender, no hepatosplenomegaly, no distention.  Neurologic: Alert and oriented x 3.  Psych: Normal affect. Extremities: No clubbing or cyanosis.   ECG: reviewed and available in electronic records.      ASSESSMENT AND PLAN: 1. Permanent atrial fibrillation: rate is controlled on current doses of metoprolol and diltiazem. Will continue warfarin. Will increase diltiazem for more optimal BP control. 2. HTN: she continues to have borderline elevation of systolic pressure, with diastolic pressure being mildly elevated as well. I will increase long-acting diltiazem to 240 mg daily. 3. PVD: follows closely with vascular surgery.  4. Chest pain and shortness of breath: Lexiscan Cardiolite stress test was negative for inducible ischemia. Symptoms of chest pain have since resolved. She did not tolerate Isordil due to headaches. DLCO significantly reduced with ventilatory defect. No further cardiac testing is indicated. I wonder if she has pulmonary thromboembolic disease, albeit she is on warfarin and hyrdroxyurea. Will defer to pulmonary.  Dispo: f/u 6 months.    Kate Sable, M.D., F.A.C.C.

## 2013-09-19 NOTE — Patient Instructions (Addendum)
Your physician recommends that you schedule a follow-up appointment in: 6 months   Your physician has recommended you make the following change in your medication:    Increase Diltiazem to 240 mg daily   Thanks for choosing [facility] !

## 2013-09-21 ENCOUNTER — Telehealth: Payer: Self-pay | Admitting: *Deleted

## 2013-09-21 NOTE — Telephone Encounter (Signed)
Called pharmacy and instead of 90 day we did 30 day. Pt made aware and states "that is better"

## 2013-09-21 NOTE — Telephone Encounter (Signed)
Pt dose of diltizem was changed and called in. Pt can not afford increase in cost for this medication and wants to know if we can call in a generic

## 2013-09-28 ENCOUNTER — Ambulatory Visit (INDEPENDENT_AMBULATORY_CARE_PROVIDER_SITE_OTHER): Payer: Medicare Other | Admitting: *Deleted

## 2013-09-28 DIAGNOSIS — I4891 Unspecified atrial fibrillation: Secondary | ICD-10-CM | POA: Diagnosis not present

## 2013-09-28 DIAGNOSIS — I482 Chronic atrial fibrillation, unspecified: Secondary | ICD-10-CM

## 2013-09-28 DIAGNOSIS — Z5181 Encounter for therapeutic drug level monitoring: Secondary | ICD-10-CM | POA: Diagnosis not present

## 2013-09-28 DIAGNOSIS — Z7901 Long term (current) use of anticoagulants: Secondary | ICD-10-CM | POA: Diagnosis not present

## 2013-09-28 LAB — POCT INR: INR: 2.6

## 2013-10-04 DIAGNOSIS — I10 Essential (primary) hypertension: Secondary | ICD-10-CM | POA: Diagnosis not present

## 2013-10-04 DIAGNOSIS — Z6825 Body mass index (BMI) 25.0-25.9, adult: Secondary | ICD-10-CM | POA: Diagnosis not present

## 2013-10-04 DIAGNOSIS — E039 Hypothyroidism, unspecified: Secondary | ICD-10-CM | POA: Diagnosis not present

## 2013-10-04 DIAGNOSIS — J45990 Exercise induced bronchospasm: Secondary | ICD-10-CM | POA: Diagnosis not present

## 2013-10-11 ENCOUNTER — Other Ambulatory Visit (HOSPITAL_COMMUNITY): Payer: Self-pay | Admitting: Oncology

## 2013-10-11 DIAGNOSIS — D45 Polycythemia vera: Secondary | ICD-10-CM

## 2013-10-11 MED ORDER — HYDROXYUREA 500 MG PO CAPS: ORAL_CAPSULE | ORAL | Status: DC

## 2013-10-14 ENCOUNTER — Emergency Department (HOSPITAL_COMMUNITY): Payer: Medicare Other

## 2013-10-14 ENCOUNTER — Emergency Department (HOSPITAL_COMMUNITY)
Admission: EM | Admit: 2013-10-14 | Discharge: 2013-10-14 | Disposition: A | Discharge status: Home / Self Care | Payer: Medicare Other | Attending: Emergency Medicine | Admitting: Emergency Medicine

## 2013-10-14 ENCOUNTER — Encounter (HOSPITAL_COMMUNITY): Payer: Self-pay | Admitting: Emergency Medicine

## 2013-10-14 DIAGNOSIS — Z9861 Coronary angioplasty status: Secondary | ICD-10-CM | POA: Diagnosis not present

## 2013-10-14 DIAGNOSIS — I10 Essential (primary) hypertension: Secondary | ICD-10-CM | POA: Insufficient documentation

## 2013-10-14 DIAGNOSIS — Z7982 Long term (current) use of aspirin: Secondary | ICD-10-CM | POA: Diagnosis not present

## 2013-10-14 DIAGNOSIS — R0602 Shortness of breath: Secondary | ICD-10-CM | POA: Insufficient documentation

## 2013-10-14 DIAGNOSIS — H9190 Unspecified hearing loss, unspecified ear: Secondary | ICD-10-CM | POA: Diagnosis not present

## 2013-10-14 DIAGNOSIS — E079 Disorder of thyroid, unspecified: Secondary | ICD-10-CM | POA: Diagnosis not present

## 2013-10-14 DIAGNOSIS — J449 Chronic obstructive pulmonary disease, unspecified: Secondary | ICD-10-CM | POA: Diagnosis not present

## 2013-10-14 DIAGNOSIS — I4891 Unspecified atrial fibrillation: Secondary | ICD-10-CM | POA: Diagnosis not present

## 2013-10-14 DIAGNOSIS — Z7901 Long term (current) use of anticoagulants: Secondary | ICD-10-CM | POA: Diagnosis not present

## 2013-10-14 DIAGNOSIS — J90 Pleural effusion, not elsewhere classified: Secondary | ICD-10-CM | POA: Diagnosis not present

## 2013-10-14 DIAGNOSIS — M129 Arthropathy, unspecified: Secondary | ICD-10-CM | POA: Insufficient documentation

## 2013-10-14 DIAGNOSIS — Z8669 Personal history of other diseases of the nervous system and sense organs: Secondary | ICD-10-CM | POA: Insufficient documentation

## 2013-10-14 DIAGNOSIS — R911 Solitary pulmonary nodule: Secondary | ICD-10-CM | POA: Diagnosis not present

## 2013-10-14 LAB — CBC WITH DIFFERENTIAL/PLATELET
Basophils Absolute: 0 10*3/uL (ref 0.0–0.1)
Basophils Relative: 1 % (ref 0–1)
EOS PCT: 0 % (ref 0–5)
Eosinophils Absolute: 0 10*3/uL (ref 0.0–0.7)
HEMATOCRIT: 49 % — AB (ref 36.0–46.0)
Hemoglobin: 16.6 g/dL — ABNORMAL HIGH (ref 12.0–15.0)
LYMPHS ABS: 0.4 10*3/uL — AB (ref 0.7–4.0)
LYMPHS PCT: 10 % — AB (ref 12–46)
MCH: 41.8 pg — AB (ref 26.0–34.0)
MCHC: 33.9 g/dL (ref 30.0–36.0)
MCV: 123.4 fL — AB (ref 78.0–100.0)
MONO ABS: 0.1 10*3/uL (ref 0.1–1.0)
MONOS PCT: 2 % — AB (ref 3–12)
Neutro Abs: 3.8 10*3/uL (ref 1.7–7.7)
Neutrophils Relative %: 88 % — ABNORMAL HIGH (ref 43–77)
Platelets: 177 10*3/uL (ref 150–400)
RBC: 3.97 MIL/uL (ref 3.87–5.11)
RDW: 14.4 % (ref 11.5–15.5)
WBC: 4.3 10*3/uL (ref 4.0–10.5)

## 2013-10-14 LAB — PROTIME-INR
INR: 2.45 — ABNORMAL HIGH (ref 0.00–1.49)
Prothrombin Time: 25.8 seconds — ABNORMAL HIGH (ref 11.6–15.2)

## 2013-10-14 LAB — BASIC METABOLIC PANEL
BUN: 17 mg/dL (ref 6–23)
CO2: 24 meq/L (ref 19–32)
CREATININE: 0.79 mg/dL (ref 0.50–1.10)
Calcium: 9.6 mg/dL (ref 8.4–10.5)
Chloride: 103 mEq/L (ref 96–112)
GFR calc Af Amer: >90 mL/min (ref >90)
GFR calc non Af Amer: 82 mL/min — ABNORMAL LOW (ref >90)
Glucose, Bld: 102 mg/dL — ABNORMAL HIGH (ref 70–99)
Potassium: 4.6 mEq/L (ref 3.7–5.3)
Sodium: 142 mEq/L (ref 137–147)

## 2013-10-14 LAB — TROPONIN I: Troponin I: <0.3 ng/mL (ref <0.30)

## 2013-10-14 LAB — D-DIMER, QUANTITATIVE (NOT AT ARMC): D DIMER QUANT: 1.42 ug{FEU}/mL — AB (ref 0.00–0.48)

## 2013-10-14 MED ORDER — ONDANSETRON 4 MG PO TBDP
4.0000 mg | ORAL_TABLET | Freq: Once | ORAL | Status: AC
  Administered 2013-10-14: 4 mg via ORAL
  Filled 2013-10-14: qty 1

## 2013-10-14 MED ORDER — SODIUM CHLORIDE 0.9 % IJ SOLN
INTRAMUSCULAR | Status: AC
  Filled 2013-10-14: qty 200

## 2013-10-14 MED ORDER — IOHEXOL 350 MG/ML SOLN
100.0000 mL | Freq: Once | INTRAVENOUS | Status: AC | PRN
  Administered 2013-10-14: 100 mL via INTRAVENOUS

## 2013-10-14 MED ORDER — SODIUM CHLORIDE 0.9 % IV BOLUS (SEPSIS)
500.0000 mL | Freq: Once | INTRAVENOUS | Status: AC
  Administered 2013-10-14: 500 mL via INTRAVENOUS

## 2013-10-14 NOTE — ED Provider Notes (Signed)
CSN: [id number]     Arrival date & time 10/14/13  0429 History   First MD Initiated Contact with Patient 10/14/13 0454     No chief complaint on file.    (Consider location/radiation/quality/duration/timing/severity/associated sxs/prior Treatment) HPI  This is a 73 year old female with a history of atrial fibrillation, polycythemia vera, peripheral vascular disease, and hypertension who presents with shortness of breath.  Patient reports that she's been having shortness of breath ever since before Christmas. She states "I've seen 3 different doctors but nobody can help me." Per the patient, she has been cleared by cardiology. She was given an inhaler last week but has not had any improvement of symptoms. She reports a chronic cough that is nonproductive. She states that this morning she became acutely more short of breath. She denies any chest pain during this time. She denies any recent fevers.  Patient denies any leg swelling, recent trips or hospitalizations. She's currently on Coumadin for her atrial fibrillation.   Patient is satting 98% on room air during my evaluation.   Past Medical History  Diagnosis Date  . Hypertension   . Arthritis   . Varicose veins   . Deaf   . Elevated WBC count     and platelets  . Thyroid disease   . Irregular heart rate   . 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 Peripheral neuropathy     feet  . 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 Atrial fibrillation, chronic   . 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 Peripheral vascular disease    Past Surgical History  Procedure Laterality Date  . Blt    . Abdominal hysterectomy    . Coronary angioplasty    . Ct of abd    . Hip pinning,cannulated  11/19/2011    Procedure: CANNULATED HIP PINNING;  Surgeon: Sanjuana Kava, MD;  Location: [facility];  Service: Orthopedics;  Laterality: Right;  . Fasciotomy  11/29/2011    Procedure: FASCIOTOMY;  Surgeon: Carole Civil, MD;  Location: [facility];  Service:  Orthopedics;  Laterality: Right;  right thigh   . Dressing change under anesthesia  12/02/2011    Procedure: DRESSING CHANGE UNDER ANESTHESIA;  Surgeon: Carole Civil, MD;  Location: [facility];  Service: Orthopedics;  Laterality: Right;  . Endarterectomy femoral Right 09/15/2012    Procedure: ENDARTERECTOMY FEMORAL;  Surgeon: Mal Misty, MD;  Location: [place];  Service: Vascular;  Laterality: Right;  . Femoral-popliteal bypass graft Right 09/15/2012    Procedure: BYPASS GRAFT FEMORAL-POPLITEAL ARTERY;  Surgeon: Mal Misty, MD;  Location: [place];  Service: Vascular;  Laterality: Right;  . Patch angioplasty Right 09/15/2012    Procedure: PATCH ANGIOPLASTY;  Surgeon: Mal Misty, MD;  Location: [place];  Service: Vascular;  Laterality: Right;   Family History  Problem Relation Age of Onset  . Diabetes Son    History  Substance Use Topics  . Smoking status: Never Smoker   . Smokeless tobacco: Never Used  . Alcohol Use: No   OB History   Grav Para Term Preterm Abortions TAB SAB Ect Mult Living                 Review of Systems  Constitutional: Negative for fever and chills.  Respiratory: Positive for cough and shortness of breath. Negative for chest tightness and wheezing.   Cardiovascular: Negative for chest pain, palpitations and leg swelling.  Gastrointestinal: Negative for nausea, vomiting and abdominal pain.  Genitourinary: Negative for dysuria.  Musculoskeletal:  Negative for back pain.  Skin: Negative for wound.  Neurological: Negative for headaches.  Psychiatric/Behavioral: Negative for confusion.  All other systems reviewed and are negative.      Allergies  Review of patient's allergies indicates no known allergies.  Home Medications   Current Outpatient Rx  Name  Route  Sig  Dispense  Refill  . aspirin EC 81 MG tablet   Oral   Take 81 mg by mouth daily.         Marland Kitchen diltiazem (CARDIZEM CD) 240 MG 24 hr capsule   Oral   Take 1 capsule (240 mg total) by  mouth daily.   90 capsule   3   . gabapentin (NEURONTIN) 100 MG capsule   Oral   Take 100 mg by mouth 3 (three) times daily.           . hydroxyurea (HYDREA) 500 MG capsule      Take 500mg  in AM and 1000 mg in PM PO daily   90 capsule   5   . levothyroxine (SYNTHROID, LEVOTHROID) 50 MCG tablet   Oral   Take 75 mcg by mouth daily before breakfast.          . metoprolol (TOPROL-XL) 100 MG 24 hr tablet   Oral   Take 100 mg by mouth 2 (two) times daily.           Marland Kitchen warfarin (COUMADIN) 5 MG tablet   Oral   Take 1 tablet (5 mg total) by mouth daily.   30 tablet   3    BP 130/83  Pulse 78  Temp(Src) 97.5 F (36.4 C) (Oral)  Resp 20  Ht 6\' 1"  (1.854 m)  Wt 188 lb (85.276 kg)  BMI 24.81 kg/m2  SpO2 98% Physical Exam  Nursing note and vitals reviewed. Constitutional: She is oriented to person, place, and time.  Elderly, nontoxic  HENT:  Head: Normocephalic and atraumatic.  Eyes: Pupils are equal, round, and reactive to light.  Neck: Neck supple.  Cardiovascular: Normal rate and normal heart sounds.   Irregular rate  Pulmonary/Chest: Effort normal and breath sounds normal. No respiratory distress. She has no wheezes.  Abdominal: Soft. There is no tenderness.  Musculoskeletal:  Trace bilateral lower extremity edema  Neurological: She is alert and oriented to person, place, and time.  Skin: Skin is warm and dry.  Psychiatric: She has a normal mood and affect.    ED Course  Procedures (including critical care time) Labs Review Labs Reviewed  CBC WITH DIFFERENTIAL - Abnormal; Notable for the following:    Hemoglobin 16.6 (*)    HCT 49.0 (*)    MCV 123.4 (*)    MCH 41.8 (*)    Neutrophils Relative % 88 (*)    Lymphocytes Relative 10 (*)    Lymphs Abs 0.4 (*)    Monocytes Relative 2 (*)    All other components within normal limits  BASIC METABOLIC PANEL - Abnormal; Notable for the following:    Glucose, Bld 102 (*)    GFR calc non Af Amer 82 (*)    All  other components within normal limits  D-DIMER, QUANTITATIVE - Abnormal; Notable for the following:    D-Dimer, Quant 1.42 (*)    All other components within normal limits  PROTIME-INR - Abnormal; Notable for the following:    Prothrombin Time 25.8 (*)    INR 2.45 (*)    All other components within normal limits  TROPONIN I  Imaging Review Dg Chest 2 View  10/14/2013   CLINICAL DATA:  Shortness of breath  EXAM: CHEST  2 VIEW  COMPARISON:  Prior radiograph from 08/22/2013  FINDINGS: The cardiac and mediastinal silhouettes are stable in size and contour, and remain within normal limits.  Lungs are mildly hyperinflated with attenuation of the pulmonary markings, suggestive of COPD. No airspace consolidation, pleural effusion, or pulmonary edema is identified. There is no pneumothorax.  No acute osseous abnormality identified.  IMPRESSION: COPD.  No acute cardiopulmonary abnormality identified.   Electronically Signed   By: Jeannine Boga M.D.   On: 10/14/2013 06:01   Ct Angio Chest W/cm &/or Wo Cm  10/14/2013   ADDENDUM REPORT: 10/14/2013 07:07  ADDENDUM: Calcified granuloma left upper lobe.  Noncalcified 3 mm nodule left upper lobe series 8, image 107. If the patient is at high risk for bronchogenic carcinoma, follow-up chest CT at 1 year is recommended. If the patient is at low risk, no follow-up is needed. This recommendation follows the consensus statement: Guidelines for Management of Small Pulmonary Nodules Detected on CT Scans: A Statement from the [place] as published in Radiology 2005; 237:395-400.   Electronically Signed   By: Carlos Levering M.D.   On: 10/14/2013 07:07   10/14/2013   CLINICAL DATA:  Shortness of breath  EXAM: CT ANGIOGRAPHY CHEST WITH CONTRAST  TECHNIQUE: Multidetector CT imaging of the chest was performed using the standard protocol during bolus administration of intravenous contrast. Multiplanar CT image reconstructions and MIPs were obtained to evaluate  the vascular anatomy.  CONTRAST:  135mL OMNIPAQUE IOHEXOL 350 MG/ML SOLN  COMPARISON:  10/14/2013 radiograph, 08/22/2013 radiograph  FINDINGS: Degraded by respiratory motion. The central pulmonary arteries are patent. The segmental and subsegmental branches to the lower lobes are nondiagnostic due to motion.  Normal caliber aorta with scattered atherosclerotic disease of the aorta and branch vessels.  Heart size upper normal to mildly enlarged. Coronary artery calcifications. No pericardial effusion.  Trace right pleural effusion.  Central airways are patent.  No pneumothorax.  Lower lobe interlobular septal thickening and mild lung base linear opacities.  No acute osseous finding.  Review of the MIP images confirms the above findings.  IMPRESSION: Degraded by respiratory motion. No central pulmonary embolism. Lower lobe segmental and subsegmental branches are nondiagnostic due to motion.  Trace right pleural effusion. Lower lobe interlobular septal thickening may reflect interstitial edema.  Mild linear lung base opacities, favor atelectasis or scarring.  Electronically Signed: By: Carlos Levering M.D. On: 10/14/2013 07:00     EKG Interpretation   Date/Time:  Friday October 14 2013 04:43:24 EDT Ventricular Rate:  82 PR Interval:    QRS Duration: 88 QT Interval:  402 QTC Calculation: 469 R Axis:   84 Text Interpretation:  Atrial fibrillation with premature ventricular or  aberrantly conducted complexes Nonspecific ST abnormality Abnormal QRS-T  angle, consider primary T wave abnormality Abnormal ECG When compared with  ECG of 14-Sep-2012 06:12, No significant change was found Confirmed by  Ransome Helwig  MD, Donterius Filley ([id number]) on 10/14/2013 4:46:51 AM      MDM   Final diagnoses:  Shortness of breath    Patient presents with shortness of breath. She's had difficulty with shortness of breath over the last several months. He does worsen this morning. She is satting 98% on my evaluation. Lung exam is  clear. I reviewed the patient's chart. She's been cleared from a cardiology perspective. An EKG here is nonischemic.  Patient is on Coumadin  however she has not had evaluation for clot or PE. D-dimer is mildly elevated at 1.42. Patient was sent to the CT scanner and has no evidence of pulmonary embolism. She does have a small pulmonary nodule but has no smoking history and this is of unknown significance.  Discuss workup with the patient. Patient ambulated with pulse ox and maintained oxygen saturations greater than 90%. She has both cardiology and pulmonology followup. Patient was instructed to call to get appointments as soon as possible given recent worsening of shortness of breath.  After history, exam, and medical workup I feel the patient has been appropriately medically screened and is safe for discharge home. Pertinent diagnoses were discussed with the patient. Patient was given return precautions.     Merryl Hacker, MD 10/14/13 [phone]

## 2013-10-14 NOTE — ED Notes (Signed)
Pt states she has been having breathing problems for several months, has had  Multiple tests for same, states she awoke this am feeling sob again, denies pain

## 2013-10-14 NOTE — ED Notes (Signed)
Discharge instructions reviewed with pt, questions answered. Pt verbalized understanding.  

## 2013-10-14 NOTE — Discharge Instructions (Signed)

## 2013-10-25 ENCOUNTER — Telehealth: Payer: Self-pay | Admitting: Cardiovascular Disease

## 2013-10-25 MED ORDER — WARFARIN SODIUM 5 MG PO TABS: ORAL_TABLET | ORAL | Status: DC

## 2013-10-25 NOTE — Telephone Encounter (Signed)
Received fax refill request  Rx # [id number] Medication:  Warfarin 5 mg tab Qty 30 Sig:  Take one tablet by mouth once daily Physician:  Bronson Ing

## 2013-11-09 ENCOUNTER — Ambulatory Visit (INDEPENDENT_AMBULATORY_CARE_PROVIDER_SITE_OTHER): Payer: Medicare Other | Admitting: *Deleted

## 2013-11-09 DIAGNOSIS — Z5181 Encounter for therapeutic drug level monitoring: Secondary | ICD-10-CM

## 2013-11-09 DIAGNOSIS — I4891 Unspecified atrial fibrillation: Secondary | ICD-10-CM

## 2013-11-09 DIAGNOSIS — I482 Chronic atrial fibrillation, unspecified: Secondary | ICD-10-CM

## 2013-11-09 LAB — POCT INR
INR: 2.6
INR: 4.6

## 2013-11-14 ENCOUNTER — Encounter (HOSPITAL_COMMUNITY): Payer: Medicare Other | Attending: Oncology

## 2013-11-14 DIAGNOSIS — D45 Polycythemia vera: Secondary | ICD-10-CM | POA: Insufficient documentation

## 2013-11-14 LAB — CBC WITH DIFFERENTIAL/PLATELET
BASOS ABS: 0.1 10*3/uL (ref 0.0–0.1)
BASOS PCT: 2 % — AB (ref 0–1)
Eosinophils Absolute: 0 10*3/uL (ref 0.0–0.7)
Eosinophils Relative: 0 % (ref 0–5)
HCT: 45.7 % (ref 36.0–46.0)
Hemoglobin: 15.2 g/dL — ABNORMAL HIGH (ref 12.0–15.0)
Lymphocytes Relative: 21 % (ref 12–46)
Lymphs Abs: 1.2 10*3/uL (ref 0.7–4.0)
MCH: 40.1 pg — ABNORMAL HIGH (ref 26.0–34.0)
MCHC: 33.3 g/dL (ref 30.0–36.0)
MCV: 120.6 fL — ABNORMAL HIGH (ref 78.0–100.0)
MONOS PCT: 3 % (ref 3–12)
Monocytes Absolute: 0.2 10*3/uL (ref 0.1–1.0)
Neutro Abs: 4.2 10*3/uL (ref 1.7–7.7)
Neutrophils Relative %: 74 % (ref 43–77)
Platelets: 135 10*3/uL — ABNORMAL LOW (ref 150–400)
RBC: 3.79 MIL/uL — AB (ref 3.87–5.11)
RDW: 14.5 % (ref 11.5–15.5)
WBC: 5.7 10*3/uL (ref 4.0–10.5)

## 2013-11-14 NOTE — Progress Notes (Signed)
Labs drawn today for cbc/diff 

## 2013-11-15 ENCOUNTER — Encounter (HOSPITAL_COMMUNITY): Payer: Self-pay

## 2013-11-15 ENCOUNTER — Encounter (HOSPITAL_BASED_OUTPATIENT_CLINIC_OR_DEPARTMENT_OTHER): Payer: Medicare Other

## 2013-11-15 VITALS — BP 139/85 | HR 62 | Temp 97.3°F | Resp 18 | Wt 188.0 lb

## 2013-11-15 DIAGNOSIS — D696 Thrombocytopenia, unspecified: Secondary | ICD-10-CM | POA: Diagnosis not present

## 2013-11-15 DIAGNOSIS — Z7901 Long term (current) use of anticoagulants: Secondary | ICD-10-CM

## 2013-11-15 DIAGNOSIS — D45 Polycythemia vera: Secondary | ICD-10-CM

## 2013-11-15 DIAGNOSIS — E039 Hypothyroidism, unspecified: Secondary | ICD-10-CM

## 2013-11-15 DIAGNOSIS — I4891 Unspecified atrial fibrillation: Secondary | ICD-10-CM | POA: Diagnosis not present

## 2013-11-15 NOTE — Patient Instructions (Signed)
[facility] Discharge Instructions  RECOMMENDATIONS MADE BY THE CONSULTANT AND ANY TEST RESULTS WILL BE SENT TO YOUR REFERRING PHYSICIAN.  EXAM FINDINGS BY THE PHYSICIAN TODAY AND SIGNS OR SYMPTOMS TO REPORT TO CLINIC OR PRIMARY PHYSICIAN: Exam and findings as discussed by Dr. Barnet Glasgow.  MEDICATIONS PRESCRIBED:  None  INSTRUCTIONS/FOLLOW-UP: Return in 4 months for blood work and follow-up visit with doctor.  Thank you for choosing [facility] to provide your oncology and hematology care.  To afford each patient quality time with our providers, please arrive at least 15 minutes before your scheduled appointment time.  With your help, our goal is to use those 15 minutes to complete the necessary work-up to ensure our physicians have the information they need to help with your evaluation and healthcare recommendations.    Effective January 1st, 2014, we ask that you re-schedule your appointment with our physicians should you arrive 10 or more minutes late for your appointment.  We strive to give you quality time with our providers, and arriving late affects you and other patients whose appointments are after yours.    Again, thank you for choosing [facility].  Our hope is that these requests will decrease the amount of time that you wait before being seen by our physicians.       _____________________________________________________________  Should you have questions after your visit to [facility], please contact our office at [phone] between the hours of 8:30 a.m. and 5:00 p.m.  Voicemails left after 4:30 p.m. will not be returned until the following business day.  For prescription refill requests, have your pharmacy contact our office with your prescription refill request.

## 2013-11-15 NOTE — Progress Notes (Signed)
[name]  OFFICE PROGRESS NOTE  [name], MD [id number] [place] [address]  DIAGNOSIS: Polycythemia vera(238.4)  Anticoagulant long-term use  Chief Complaint  Patient presents with  . Polycythemia vera    CURRENT THERAPY: Hydrea 500 mg in the morning and 1000 mg at bedtime 7 days a week.  INTERVAL HISTORY: Charlotte [name] 73 y.o. female returns for followup of JAK-2 positive P. vera while on Hydrea therapy.  She continues to do well and had cardiac medications altered recently improving shortness of breath. She denies any PND, orthopnea, palpitations, melena, better cheesy, hematuria, hemoptysis, lower extremity swelling or redness, peripheral cyanosis, skin rash, headache, or seizures.  MEDICAL HISTORY: Past Medical History  Diagnosis Date  . Hypertension   . Arthritis   . Varicose veins   . Deaf   . Elevated WBC count     and platelets  . Thyroid disease   . Irregular heart rate   . 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 Peripheral neuropathy     feet  . 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 Atrial fibrillation, chronic   . 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    ALLERGIES:  has No Known Allergies.  MEDICATIONS: has a current medication list which  includes the following prescription(s): aspirin ec, diltiazem, gabapentin, hydroxyurea, levothyroxine, metoprolol succinate, and warfarin.  SURGICAL HISTORY:  Past Surgical History  Procedure Laterality Date  . Blt    . Abdominal hysterectomy    . Coronary angioplasty    . Ct of abd    . Hip pinning,cannulated  11/19/2011    Procedure: CANNULATED HIP PINNING;  Surgeon: Sanjuana Kava, MD;  Location: [facility];  Service: Orthopedics;  Laterality: Right;  . Fasciotomy  11/29/2011    Procedure: FASCIOTOMY;  Surgeon: Carole Civil, MD;  Location: [facility];  Service: Orthopedics;  Laterality: Right;  right thigh   . Dressing change under anesthesia  12/02/2011    Procedure: DRESSING CHANGE UNDER ANESTHESIA;  Surgeon: Carole Civil, MD;  Location: [facility];  Service: Orthopedics;  Laterality: Right;  . Endarterectomy femoral Right 09/15/2012    Procedure: ENDARTERECTOMY FEMORAL;  Surgeon: Mal Misty, MD;  Location: [place];  Service: Vascular;  Laterality: Right;  . Femoral-popliteal bypass graft Right 09/15/2012    Procedure: BYPASS GRAFT FEMORAL-POPLITEAL ARTERY;  Surgeon: Mal Misty, MD;  Location: [place];  Service: Vascular;  Laterality: Right;  . Patch angioplasty Right 09/15/2012    Procedure: PATCH ANGIOPLASTY;  Surgeon: Mal Misty, MD;  Location: [facility];  Service: Vascular;  Laterality: Right;    FAMILY HISTORY: family history includes Diabetes in her son.  SOCIAL HISTORY:  reports that she has never smoked. She has never used smokeless tobacco. She reports that she does not drink alcohol or use illicit drugs.  REVIEW OF SYSTEMS:  Other than that discussed above is noncontributory.  PHYSICAL EXAMINATION: ECOG PERFORMANCE  STATUS: 1 - Symptomatic but completely ambulatory  Blood pressure 139/85, pulse 62, temperature 97.3 F (36.3 C), temperature source Oral, resp. rate 18, weight 188 lb (85.276 kg).  GENERAL:alert, no distress and comfortable SKIN: skin color, texture, turgor  are normal, no rashes or significant lesions EYES: PERLA; Conjunctiva are pink and non-injected, sclera clear SINUSES: No redness or tenderness over maxillary or ethmoid sinuses OROPHARYNX:no exudate, no erythema on lips, buccal mucosa, or tongue. NECK: supple, thyroid normal size, non-tender, without nodularity. No masses CHEST: Increased AP diameter with no breast masses. LYMPH:  no palpable lymphadenopathy in the cervical, axillary or inguinal LUNGS: clear to auscultation and percussion with normal breathing effort HEART: Irregularly irregular with no S3. ABDOMEN:abdomen soft, non-tender and normal bowel sounds. Liver and spleen not enlarged. MUSCULOSKELETAL:no cyanosis of digits and no clubbing. Range of motion normal.  NEURO: alert & oriented x 3 with fluent speech, no focal motor/sensory deficits   LABORATORY DATA: Infusion on 11/14/2013  Component Date Value Ref Range Status  . WBC 11/14/2013 5.7  4.0 - 10.5 K/uL Final  . RBC 11/14/2013 3.79* 3.87 - 5.11 MIL/uL Final  . Hemoglobin 11/14/2013 15.2* 12.0 - 15.0 g/dL Final  . HCT 11/14/2013 45.7  36.0 - 46.0 % Final  . MCV 11/14/2013 120.6* 78.0 - 100.0 fL Final  . MCH 11/14/2013 40.1* 26.0 - 34.0 pg Final  . MCHC 11/14/2013 33.3  30.0 - 36.0 g/dL Final  . RDW 11/14/2013 14.5  11.5 - 15.5 % Final  . Platelets 11/14/2013 135* 150 - 400 K/uL Final  . Neutrophils Relative % 11/14/2013 74  43 - 77 % Final  . Neutro Abs 11/14/2013 4.2  1.7 - 7.7 K/uL Final  . Lymphocytes Relative 11/14/2013 21  12 - 46 % Final  . Lymphs Abs 11/14/2013 1.2  0.7 - 4.0 K/uL Final  . Monocytes Relative 11/14/2013 3  3 - 12 % Final  . Monocytes Absolute 11/14/2013 0.2  0.1 - 1.0 K/uL Final  . Eosinophils Relative 11/14/2013 0  0 - 5 % Final  . Eosinophils Absolute 11/14/2013 0.0  0.0 - 0.7 K/uL Final  . Basophils Relative 11/14/2013 2* 0 - 1 % Final  . Basophils Absolute 11/14/2013 0.1  0.0 - 0.1 K/uL Final  Anti-coag visit on 11/09/2013  Component  Date Value Ref Range Status  . INR 11/09/2013 4.6   Final  . INR 11/09/2013 2.6   Final    PATHOLOGY: No new pathology. JAK-2 mutation is positive.  Urinalysis    Component Value Date/Time   COLORURINE YELLOW 09/14/2012 2337   APPEARANCEUR CLEAR 09/14/2012 2337   LABSPEC 1.020 09/14/2012 2337   PHURINE 6.5 09/14/2012 2337   GLUCOSEU NEGATIVE 09/14/2012 2337   HGBUR NEGATIVE 09/14/2012 2337   BILIRUBINUR NEGATIVE 09/14/2012 2337   KETONESUR NEGATIVE 09/14/2012 2337   PROTEINUR NEGATIVE 09/14/2012 2337   UROBILINOGEN 1.0 09/14/2012 2337   NITRITE NEGATIVE 09/14/2012 2337   LEUKOCYTESUR NEGATIVE 09/14/2012 2337    RADIOGRAPHIC STUDIES: CT Angio Chest W/Cm &/Or Wo Cm Status: Edited Result - FINAL         PACS Images    Show images for CT Angio Chest W/Cm &/Or Wo Cm         Addendum    Lucilla Lame, MD [facility] Oct 14, 2013 7:10:00 AM EDT       ADDENDUM REPORT: 10/14/2013 07:07  ADDENDUM:  Calcified granuloma left upper lobe.  Noncalcified 3 mm nodule left upper lobe series 8, image 107.  If the  patient is at high risk for bronchogenic carcinoma, follow-up chest  CT at 1 year is recommended. If the patient is at low risk, no  follow-up is needed. This recommendation follows the consensus  statement: Guidelines for Management of Small Pulmonary Nodules  Detected on CT Scans: A Statement from the [place] as  published in Radiology 2005; 237:395-400.  Electronically Signed  By: Carlos Levering M.D.  On: 10/14/2013 07:07       Study Result    CLINICAL DATA: Shortness of breath  EXAM:  CT ANGIOGRAPHY CHEST WITH CONTRAST  TECHNIQUE:  Multidetector CT imaging of the chest was performed using the  standard protocol during bolus administration of intravenous  contrast. Multiplanar CT image reconstructions and MIPs were  obtained to evaluate the vascular anatomy.  CONTRAST: 136mL OMNIPAQUE IOHEXOL 350 MG/ML SOLN  COMPARISON: 10/14/2013 radiograph, 08/22/2013  radiograph  FINDINGS:  Degraded by respiratory motion. The central pulmonary arteries are  patent. The segmental and subsegmental branches to the lower lobes  are nondiagnostic due to motion.  Normal caliber aorta with scattered atherosclerotic disease of the  aorta and branch vessels.  Heart size upper normal to mildly enlarged. Coronary artery  calcifications. No pericardial effusion.  Trace right pleural effusion.  Central airways are patent. No pneumothorax.  Lower lobe interlobular septal thickening and mild lung base linear  opacities.  No acute osseous finding.  Review of the MIP images confirms the above findings.  IMPRESSION:  Degraded by respiratory motion. No central pulmonary embolism. Lower  lobe segmental and subsegmental branches are nondiagnostic due to  motion.  Trace right pleural effusion. Lower lobe interlobular septal  thickening may reflect interstitial edema.  Mild linear lung base opacities, favor atelectasis or scarring.  Electronically Signed:  By: Carlos Levering M.D.  On: 10/14/2013     ASSESSMENT:  #1. Polycythemia vera, currently on Hydrea, now with thrombocytopenia.  #2. Increasing shortness of breath, rule out pulmonary pathology.  #3. Chronic atrial fibrillation, controlled ventricular response.  #4. Hypothyroidism, on treatment.  #5. Peripheral vascular disease, status post femoral endarterectomy and femorals-popliteal bypass.  #6. Severe hearing deficit    PLAN:  #1. Continue Hydrea 500 mg in the morning and 1000 mg in the evening 7 days per week. #2. Followup in 4 months with CBC, chem profile, and LDH. Warfarin dosages being monitored by her cardiologist.   All questions were answered. The patient knows to call the clinic with any problems, questions or concerns. We can certainly see the patient much sooner if necessary.   I spent 25 minutes counseling the patient face to face. The total time spent in the appointment was 30  minutes.    Farrel Gobble, MD 11/15/2013 11:05 AM

## 2013-11-19 DIAGNOSIS — I83009 Varicose veins of unspecified lower extremity with ulcer of unspecified site: Secondary | ICD-10-CM | POA: Diagnosis not present

## 2013-11-19 DIAGNOSIS — I10 Essential (primary) hypertension: Secondary | ICD-10-CM | POA: Diagnosis not present

## 2013-11-19 DIAGNOSIS — Z6825 Body mass index (BMI) 25.0-25.9, adult: Secondary | ICD-10-CM | POA: Diagnosis not present

## 2013-11-21 ENCOUNTER — Other Ambulatory Visit (HOSPITAL_COMMUNITY): Payer: Self-pay | Admitting: Family Medicine

## 2013-11-21 ENCOUNTER — Telehealth: Payer: Self-pay | Admitting: *Deleted

## 2013-11-21 DIAGNOSIS — Z139 Encounter for screening, unspecified: Secondary | ICD-10-CM

## 2013-11-21 DIAGNOSIS — Z1231 Encounter for screening mammogram for malignant neoplasm of breast: Secondary | ICD-10-CM

## 2013-11-21 NOTE — Telephone Encounter (Signed)
Patient states that she was started on antibiotic on Saturday.  Please call with instructions / tgs

## 2013-11-21 NOTE — Telephone Encounter (Signed)
Started on Keflex 500mg  qid x 5 days on Saturday for toe wound.  Told pt to take coumadin 1/2 tablet daily until INR check on 11/24/13.  She verbalized understanding.

## 2013-11-22 DIAGNOSIS — E039 Hypothyroidism, unspecified: Secondary | ICD-10-CM | POA: Diagnosis not present

## 2013-11-22 DIAGNOSIS — J45990 Exercise induced bronchospasm: Secondary | ICD-10-CM | POA: Diagnosis not present

## 2013-11-22 DIAGNOSIS — I10 Essential (primary) hypertension: Secondary | ICD-10-CM | POA: Diagnosis not present

## 2013-11-22 DIAGNOSIS — Z6825 Body mass index (BMI) 25.0-25.9, adult: Secondary | ICD-10-CM | POA: Diagnosis not present

## 2013-11-24 ENCOUNTER — Ambulatory Visit (INDEPENDENT_AMBULATORY_CARE_PROVIDER_SITE_OTHER): Payer: Medicare Other | Admitting: *Deleted

## 2013-11-24 ENCOUNTER — Ambulatory Visit (HOSPITAL_COMMUNITY)
Admission: RE | Admit: 2013-11-24 | Discharge: 2013-11-24 | Disposition: A | Discharge status: Home / Self Care | Payer: Medicare Other | Source: Ambulatory Visit | Attending: Family Medicine | Admitting: Family Medicine

## 2013-11-24 DIAGNOSIS — I4891 Unspecified atrial fibrillation: Secondary | ICD-10-CM

## 2013-11-24 DIAGNOSIS — I482 Chronic atrial fibrillation, unspecified: Secondary | ICD-10-CM

## 2013-11-24 DIAGNOSIS — Z1231 Encounter for screening mammogram for malignant neoplasm of breast: Secondary | ICD-10-CM | POA: Diagnosis not present

## 2013-11-24 DIAGNOSIS — Z5181 Encounter for therapeutic drug level monitoring: Secondary | ICD-10-CM | POA: Diagnosis not present

## 2013-11-24 LAB — POCT INR: INR: 1.6

## 2013-11-28 ENCOUNTER — Other Ambulatory Visit: Payer: Self-pay | Admitting: Family Medicine

## 2013-11-28 DIAGNOSIS — R928 Other abnormal and inconclusive findings on diagnostic imaging of breast: Secondary | ICD-10-CM

## 2013-12-08 ENCOUNTER — Ambulatory Visit (INDEPENDENT_AMBULATORY_CARE_PROVIDER_SITE_OTHER): Payer: Medicare Other | Admitting: *Deleted

## 2013-12-08 DIAGNOSIS — Z5181 Encounter for therapeutic drug level monitoring: Secondary | ICD-10-CM | POA: Diagnosis not present

## 2013-12-08 DIAGNOSIS — I4891 Unspecified atrial fibrillation: Secondary | ICD-10-CM | POA: Diagnosis not present

## 2013-12-08 DIAGNOSIS — I482 Chronic atrial fibrillation, unspecified: Secondary | ICD-10-CM

## 2013-12-08 LAB — POCT INR: INR: 2.5

## 2013-12-13 ENCOUNTER — Other Ambulatory Visit: Payer: Self-pay | Admitting: Family Medicine

## 2013-12-13 ENCOUNTER — Ambulatory Visit (HOSPITAL_COMMUNITY)
Admission: RE | Admit: 2013-12-13 | Discharge: 2013-12-13 | Disposition: A | Discharge status: Home / Self Care | Payer: Medicare Other | Source: Ambulatory Visit | Attending: Family Medicine | Admitting: Family Medicine

## 2013-12-13 DIAGNOSIS — R928 Other abnormal and inconclusive findings on diagnostic imaging of breast: Secondary | ICD-10-CM

## 2013-12-13 DIAGNOSIS — N63 Unspecified lump in unspecified breast: Secondary | ICD-10-CM | POA: Diagnosis not present

## 2013-12-21 ENCOUNTER — Encounter (HOSPITAL_COMMUNITY): Payer: Medicare Other

## 2013-12-27 ENCOUNTER — Ambulatory Visit (HOSPITAL_COMMUNITY)
Admission: RE | Admit: 2013-12-27 | Discharge: 2013-12-27 | Disposition: A | Discharge status: Home / Self Care | Payer: Medicare Other | Source: Ambulatory Visit | Attending: Family Medicine | Admitting: Family Medicine

## 2013-12-27 ENCOUNTER — Other Ambulatory Visit (HOSPITAL_COMMUNITY): Payer: Self-pay | Admitting: Family Medicine

## 2013-12-27 DIAGNOSIS — M25473 Effusion, unspecified ankle: Secondary | ICD-10-CM | POA: Insufficient documentation

## 2013-12-27 DIAGNOSIS — IMO0002 Reserved for concepts with insufficient information to code with codable children: Secondary | ICD-10-CM

## 2013-12-27 DIAGNOSIS — Z6825 Body mass index (BMI) 25.0-25.9, adult: Secondary | ICD-10-CM | POA: Diagnosis not present

## 2013-12-27 DIAGNOSIS — M25476 Effusion, unspecified foot: Secondary | ICD-10-CM | POA: Diagnosis not present

## 2013-12-27 DIAGNOSIS — S81809A Unspecified open wound, unspecified lower leg, initial encounter: Secondary | ICD-10-CM

## 2013-12-27 DIAGNOSIS — S81009A Unspecified open wound, unspecified knee, initial encounter: Secondary | ICD-10-CM | POA: Diagnosis not present

## 2013-12-27 DIAGNOSIS — IMO0001 Reserved for inherently not codable concepts without codable children: Secondary | ICD-10-CM | POA: Insufficient documentation

## 2013-12-27 DIAGNOSIS — M25579 Pain in unspecified ankle and joints of unspecified foot: Secondary | ICD-10-CM | POA: Diagnosis not present

## 2013-12-27 DIAGNOSIS — L02419 Cutaneous abscess of limb, unspecified: Secondary | ICD-10-CM | POA: Diagnosis not present

## 2013-12-27 DIAGNOSIS — M7989 Other specified soft tissue disorders: Secondary | ICD-10-CM | POA: Diagnosis not present

## 2013-12-27 DIAGNOSIS — S91009A Unspecified open wound, unspecified ankle, initial encounter: Secondary | ICD-10-CM

## 2013-12-27 NOTE — Progress Notes (Signed)
Physical Therapy - Wound Therapy Evaluation  Evaluation   Patient Details  Name: Charlotte [name] MRN: [id number] Date of Birth: 05-28-1941  Today's Date: 12/27/2013 Time: [id number] Time Calculation (min): 35 min    Charges: 1 Evaluation and 1440-1515 debrideemnt Visit#: 1 of 15  Re-eval:  01/26/2014  Subjective Subjective Assessment Subjective: Patient's primary complaint is Lt medial maleolus pain and  Patient and Family Stated Goals: for wound to heal Date of Onset: 11/29/13 Prior Treatments: no  Pain Assessment Pain Assessment Pain Assessment: 0-10 Pain Score: 9  Pain Location: Ankle  Wound Therapy Wound / Incision (Open or Dehisced) 12/27/13 Puncture (Active)  Dressing Type Honeycomb;Gauze (Comment) 12/27/2013  7:01 PM  Dressing Changed New 12/27/2013  7:01 PM  Dressing Status Clean 12/27/2013  7:01 PM  Dressing Change Frequency Daily 12/27/2013  7:01 PM  Site / Wound Assessment Bleeding;Yellow;Red 12/27/2013  7:01 PM  % Wound base Red or Granulating 5% 12/27/2013  7:01 PM  % Wound base Yellow 95% 12/27/2013  7:01 PM  % Wound base Black 0% 12/27/2013  7:01 PM  % Wound base Other (Comment) 0% 12/27/2013  7:01 PM  Peri-wound Assessment Erythema (blanchable);Intact 12/27/2013  7:01 PM  Wound Length (cm) 2.9 cm 12/27/2013  7:01 PM  Wound Width (cm) 3 cm 12/27/2013  7:01 PM  Margins Unattached edges (unapproximated) 12/27/2013  7:01 PM  Drainage Amount Minimal 12/27/2013  7:01 PM  Drainage Description Serosanguineous;Sanguineous 12/27/2013  7:01 PM  Treatment Cleansed;Debridement (Selective) 12/27/2013  7:01 PM     Incision 11/19/11 Hip Right (Active)     Incision 11/29/11 Hip Right (Active)     Incision 12/02/11 Leg Right (Active)     Incision 09/14/12 Groin Left (Active)     Incision 09/15/12 Groin Right (Active)     Incision 09/15/12 Thigh Right (Active)   Selective Debridement Selective Debridement - Location: Lt ankle lateral maleolus  Selective Debridement - Tools  Used: Forceps Selective Debridement - Tissue Removed: slaugh   Physical Therapy Assessment and Plan Wound Therapy - Assess/Plan/Recommendations Wound Therapy - Clinical Statement: Patient has high amounts of pain during debridement with wound slough being very resistant to debridment little debridement was obtained. Utilizing honey to improve wound heling environement in anticipation of improved debridemnt next session.  Wound Therapy - Functional Problem List: pain with walking Factors Delaying/Impairing Wound Healing: Altered sensation (suspecting possibly vascular compromise though patient denies this. ) Wound Therapy - Frequency: 3X / week Wound Plan: Continue dressing change once daily and debridement 3x a week to improve wound healing enviroment and decrease risk of infection.       Goals Wound Therapy Goals - Improve the function of patient's integumentary system by progressing the wound(s) through the phases of wound healing by: Decrease Necrotic Tissue to: 0% Decrease Necrotic Tissue - Progress: Goal set today Increase Granulation Tissue to: 10% Increase Granulation Tissue - Progress: Goal set today Decrease Length/Width/Depth by (cm): 3x2.9 Decrease Length/Width/Depth - Progress: Goal set today Improve Drainage Characteristics: Min Improve Drainage Characteristics - Progress: Goal set today Patient/Family will be able to : perform dressign changes independently at home.  Patient/Family Instruction Goal - Progress: Goal set today Goals/treatment plan/discharge plan were made with and agreed upon by patient/family: Yes Time For Goal Achievement: Other (comment) (4 weeks)  Problem List Patient Active Problem List   Diagnosis Date Noted  . Encounter for therapeutic drug monitoring 08/31/2013  . Aftercare following surgery of the circulatory system, [place] 12/21/2012  . Pain in limb 10/19/2012  .  Peripheral vascular disease, unspecified 09/24/2012  . Swollen leg 09/24/2012  .  Varicose veins of lower extremities with other complications [id number]  . Chronic total occlusion of artery of the extremities 09/06/2012  . 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 Wound infection after surgery 01/07/2012  . Compartment syndrome, nontraumatic, lower extremity 11/29/2011  . Pain of right thigh 11/28/2011  . Hip fracture, right 11/15/2011  . Chronic atrial fibrillation 11/15/2011  . Anticoagulant long-term use 11/15/2011  . HTN (hypertension) 11/15/2011  . Hypothyroidism 11/15/2011  . Polycythemia vera 10/01/2011    GP Functional Assessment Tool Used: Clinical judgement Functional Limitation: Mobility: Walking and moving around Mobility: Walking and Moving Around Current Status [phone]): At least 20 percent but less than 40 percent impaired, limited or restricted Mobility: Walking and Moving Around Goal Status [phone]): At least 1 percent but less than 20 percent impaired, limited or restricted  Ortha Metts R Karan Inclan 12/27/2013, 7:16 PM

## 2013-12-28 ENCOUNTER — Ambulatory Visit (HOSPITAL_COMMUNITY)
Admission: RE | Admit: 2013-12-28 | Discharge: 2013-12-28 | Disposition: A | Discharge status: Home / Self Care | Payer: Medicare Other | Source: Ambulatory Visit | Attending: Family Medicine | Admitting: Family Medicine

## 2013-12-28 DIAGNOSIS — Z6825 Body mass index (BMI) 25.0-25.9, adult: Secondary | ICD-10-CM | POA: Diagnosis not present

## 2013-12-28 DIAGNOSIS — L03119 Cellulitis of unspecified part of limb: Secondary | ICD-10-CM | POA: Diagnosis not present

## 2013-12-28 DIAGNOSIS — L02419 Cutaneous abscess of limb, unspecified: Secondary | ICD-10-CM | POA: Diagnosis not present

## 2013-12-28 NOTE — Progress Notes (Signed)
Physical Therapy - Wound Therapy  Treatment   Patient Details  Name: Charlotte [name] MRN: [id number] Date of Birth: [birth year]  Today's Date: 12/28/2013 Time: [id number] Time Calculation (min): 30 min Charges: Selective debridement (= or < 20 cm)   Visit#: 2 of 15   Subjective Subjective Assessment Subjective: Pt states that her wound aches at night.  Pain Assessment Pain Assessment Pain Assessment: No/denies pain  Wound Therapy Wound / Incision (Open or Dehisced) 12/27/13 Puncture (Active)  Dressing Type Honeycomb;Gauze (Comment) 12/28/2013  1:38 PM  Dressing Changed Changed 12/28/2013  1:38 PM  Dressing Status Clean;Dry;Intact 12/28/2013  1:38 PM  Dressing Change Frequency Every other day 12/28/2013  1:38 PM  Site / Wound Assessment Bleeding;Yellow;Red 12/28/2013  1:38 PM  % Wound base Red or Granulating 5% 12/28/2013  1:38 PM  % Wound base Yellow 95% 12/28/2013  1:38 PM  % Wound base Black 0% 12/28/2013  1:38 PM  % Wound base Other (Comment) 0% 12/28/2013  1:38 PM  Peri-wound Assessment Erythema (blanchable);Intact 12/28/2013  1:38 PM  Wound Length (cm) 2.9 cm 12/27/2013  7:01 PM  Wound Width (cm) 3 cm 12/27/2013  7:01 PM  Margins Unattached edges (unapproximated) 12/28/2013  1:38 PM  Drainage Amount Minimal 12/28/2013  1:38 PM  Drainage Description Serosanguineous;Sanguineous 12/28/2013  1:38 PM  Treatment Cleansed;Debridement (Selective) 12/28/2013  1:38 PM     Incision 11/19/11 Hip Right (Active)     Incision 11/29/11 Hip Right (Active)     Incision 12/02/11 Leg Right (Active)     Incision 09/14/12 Groin Left (Active)     Incision 09/15/12 Groin Right (Active)     Incision 09/15/12 Thigh Right (Active)   Selective Debridement Selective Debridement - Location: Lt ankle lateral maleolus  Selective Debridement - Tools Used: Forceps;Scalpel Selective Debridement - Tissue Removed: slough   Physical Therapy Assessment and Plan Wound Therapy -  Assess/Plan/Recommendations Wound Therapy - Clinical Statement: Pt continues to have low tolerance debridement. Therapist able to cleanse wound and remove small amount of necrotic tissue with scalpel and forceps. Continued with honey alginate as wound appears to be responding well to it. Vaseline applied to periwound to protect skin integrity. Wound Plan: Continue wound care per PT POC.  Problem List Patient Active Problem List   Diagnosis Date Noted  . Encounter for therapeutic drug monitoring 08/31/2013  . Aftercare following surgery of the circulatory system, [place] 12/21/2012  . Pain in limb 10/19/2012  . Peripheral vascular disease, unspecified 09/24/2012  . Swollen leg 09/24/2012  . Varicose veins of lower extremities with other complications [id number]  . Chronic total occlusion of artery of the extremities 09/06/2012  . 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 Wound infection after surgery 01/07/2012  . Compartment syndrome, nontraumatic, lower extremity 11/29/2011  . Pain of right thigh 11/28/2011  . Hip fracture, right 11/15/2011  . Chronic atrial fibrillation 11/15/2011  . Anticoagulant long-term use 11/15/2011  . HTN (hypertension) 11/15/2011  . Hypothyroidism 11/15/2011  . Polycythemia vera 10/01/2011   Rachelle Hora, PTA 12/28/2013, 1:45 PM

## 2013-12-29 ENCOUNTER — Telehealth: Payer: Self-pay | Admitting: *Deleted

## 2013-12-29 NOTE — Telephone Encounter (Signed)
Pt states she was started on Augmentin today. Will finish 6/10.  Wants to know what to do about coumadin.  Told pt to decrease coumadin to 1/2 tablet daily except 1 tablet on Tuesdays and Fridays until INR appt on 6/4.  She verbalized understanding.

## 2013-12-30 ENCOUNTER — Ambulatory Visit (HOSPITAL_COMMUNITY)
Admission: RE | Admit: 2013-12-30 | Discharge: 2013-12-30 | Disposition: A | Discharge status: Home / Self Care | Payer: Medicare Other | Source: Ambulatory Visit | Attending: Family Medicine | Admitting: Family Medicine

## 2013-12-30 NOTE — Progress Notes (Signed)
Physical Therapy - Wound Therapy  Treatment   Patient Details  Name: Charlotte [name] MRN: [id number] Date of Birth: 1941/04/03  Today's Date: 12/30/2013 Time: 0930-0950 Time Calculation (min): 20 min  Visit#: 3 of 15  Re-eval: 01/26/14  Subjective Subjective Assessment Subjective: Pt states that her wound aches at night and is still sensitive to touch whcih is whyu she doesn't wear shoes very often.   Pain Assessment Pain Assessment Pain Score: 7  Pain Location: Ankle  Wound Therapy Wound / Incision (Open or Dehisced) 12/27/13 Puncture (Active)  Dressing Type Honeycomb;Gauze (Comment) 12/30/2013  9:55 AM  Dressing Changed Changed 12/30/2013  9:55 AM  Dressing Status Clean;Dry;Intact 12/30/2013  9:55 AM  Dressing Change Frequency Every other day 12/30/2013  9:55 AM  Site / Wound Assessment Bleeding;Yellow;Red 12/30/2013  9:55 AM  % Wound base Red or Granulating 15% 12/30/2013  9:55 AM  % Wound base Yellow 85% 12/30/2013  9:55 AM  % Wound base Black 0% 12/30/2013  9:55 AM  % Wound base Other (Comment) 0% 12/30/2013  9:55 AM  Peri-wound Assessment Erythema (blanchable);Intact 12/30/2013  9:55 AM  Wound Length (cm) 2.9 cm 12/27/2013  7:01 PM  Wound Width (cm) 3 cm 12/27/2013  7:01 PM  Margins Unattached edges (unapproximated) 12/30/2013  9:55 AM  Drainage Amount Minimal 12/30/2013  9:55 AM  Drainage Description Serosanguineous;Sanguineous 12/30/2013  9:55 AM  Treatment Cleansed;Debridement (Selective) 12/30/2013  9:55 AM     Incision 11/19/11 Hip Right (Active)     Incision 11/29/11 Hip Right (Active)     Incision 12/02/11 Leg Right (Active)     Incision 09/14/12 Groin Left (Active)     Incision 09/15/12 Groin Right (Active)     Incision 09/15/12 Thigh Right (Active)   Selective Debridement Selective Debridement - Location: Lt ankle lateral maleolus  Selective Debridement - Tools Used: Forceps;Scalpel Selective Debridement - Tissue Removed: slough   Physical Therapy Assessment  and Plan Wound Therapy - Assess/Plan/Recommendations Wound Therapy - Clinical Statement: Pt continues to have low tolerance debridement with patient noting severe pain during selective debridement. Therapist able to cleanse wound and remove small amount of necrotic tissue with scalpel and forceps. Continued with honey alginate as wound appears to be responding well to it. Vaseline applied to periwound to protect skin integrity. Wound Therapy - Functional Problem List: pain with walking Factors Delaying/Impairing Wound Healing: Altered sensation Wound Therapy - Frequency: 3X / week Wound Plan: Continue wound care 2 to 3 times a week for 2 more weeks.       Goals Wound Therapy Goals - Improve the function of patient's integumentary system by progressing the wound(s) through the phases of wound healing by: Decrease Necrotic Tissue to: 0% Decrease Necrotic Tissue - Progress: Progressing toward goal Increase Granulation Tissue to: 100% Increase Granulation Tissue - Progress: Progressing toward goal Decrease Length/Width/Depth by (cm): 3x2.9 Decrease Length/Width/Depth - Progress: Progressing toward goal Patient/Family will be able to : perform dressign changes independently at home.  Patient/Family Instruction Goal - Progress: Progressing toward goal  Problem List Patient Active Problem List   Diagnosis Date Noted  . Encounter for therapeutic drug monitoring 08/31/2013  . Aftercare following surgery of the circulatory system, [place] 12/21/2012  . Pain in limb 10/19/2012  . Peripheral vascular disease, unspecified 09/24/2012  . Swollen leg 09/24/2012  . Varicose veins of lower extremities with other complications [id number]  . Chronic total occlusion of artery of the extremities 09/06/2012  . 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 Wound  infection after surgery 01/07/2012  . Compartment syndrome, nontraumatic, lower extremity 11/29/2011  . Pain of right thigh 11/28/2011  . Hip  fracture, right 11/15/2011  . Chronic atrial fibrillation 11/15/2011  . Anticoagulant long-term use 11/15/2011  . HTN (hypertension) 11/15/2011  . Hypothyroidism 11/15/2011  . Polycythemia vera 10/01/2011    GP    Rilynne Lonsway R Jannae Fagerstrom PT DPT 12/30/2013, 9:59 AM

## 2014-01-03 ENCOUNTER — Ambulatory Visit (HOSPITAL_COMMUNITY)
Admission: RE | Admit: 2014-01-03 | Discharge: 2014-01-03 | Disposition: A | Discharge status: Home / Self Care | Payer: Medicare Other | Source: Ambulatory Visit | Attending: Family Medicine | Admitting: Family Medicine

## 2014-01-03 DIAGNOSIS — I739 Peripheral vascular disease, unspecified: Secondary | ICD-10-CM | POA: Insufficient documentation

## 2014-01-03 DIAGNOSIS — IMO0001 Reserved for inherently not codable concepts without codable children: Secondary | ICD-10-CM | POA: Insufficient documentation

## 2014-01-03 DIAGNOSIS — L97309 Non-pressure chronic ulcer of unspecified ankle with unspecified severity: Secondary | ICD-10-CM | POA: Insufficient documentation

## 2014-01-03 NOTE — Progress Notes (Signed)
Physical Therapy - Wound Therapy  Treatment   Patient Details  Name: Charlotte [name] MRN: [id number] Date of Birth: 10/08/1940  Today's Date: 01/03/2014 Time: [id number] Time Calculation (min): 32 min Charge: selective debridement <20 cm  Visit#: 4 of 15  Re-eval: 01/26/14  Subjective Subjective Assessment Subjective: Pt stated wound pain scale 8/10, stated MD told her to remove dressings and let her wound air out.    Pain Assessment Pain Assessment Pain Score: 8  Pain Location: Ankle Pain Orientation: Left  Wound Therapy  01/03/14 1340  Subjective Assessment  Subjective Pt stated wound pain scale 8/10, stated MD told her to remove dressings and let her wound air out.    Evaluation and Treatment  Evaluation and Treatment Procedures Explained to Patient/Family Yes  Evaluation and Treatment Procedures agreed to  Wound / Incision (Open or Dehisced)  Date First Assessed/Time First Assessed: 12/27/13 1600   Wound Type: Puncture  Dressing Type Other (Comment);Gauze (Comment);Compression wrap (medihoney alginate)  Dressing Changed New  Dressing Status Clean;Dry  Site / Wound Assessment Bleeding;Yellow;Red  % Wound base Red or Granulating 35%  % Wound base Yellow 65%  % Wound base Black 0%  Peri-wound Assessment Erythema (blanchable);Intact  Margins Unattached edges (unapproximated)  Drainage Amount Minimal  Drainage Description Serosanguineous;Sanguineous  Treatment Cleansed;Debridement (Selective);Other (Comment);Ice applied (Ice massage complete prior wound debridement for pain contro)  Selective Debridement  Selective Debridement - Location Lt ankle lateral maleolus   Selective Debridement - Tools Used Forceps;Scalpel  Selective Debridement - Tissue Removed slough  Wound Therapy - Assess/Plan/Recommendations  Wound Therapy - Clinical Statement Pt continues to be limited by pain with debridement.  Began session with ice massage for pain control.  Increase ease with  slough removal with use of medihoney, continued with medihoney dressings this session.  Pt reported she needs pain medication to continue debridement.   Ended session due to pain.  Pt educated on wound healing process and encouraged to keep wound covered. for maximum benefits.    Hydrotherapy Plan Debridement  Wound Plan Continue wound care as tolerated.    Selective Debridement Selective Debridement - Location: Lt ankle lateral maleolus  Selective Debridement - Tools Used: Forceps;Scalpel Selective Debridement - Tissue Removed: slough   Physical Therapy Assessment and Plan Wound Therapy - Assess/Plan/Recommendations Wound Therapy - Clinical Statement: Pt continues to be limited by pain with debridement.  Began session with ice massage for pain control.  Increase ease with slough removal with use of medihoney, continued with medihoney dressings this session.  Pt reported she needs pain medication to continue debridement.   Ended session due to pain.  Pt educated on wound healing process and encouraged to keep wound covered. for maximum benefits.   Hydrotherapy Plan: Debridement Wound Plan: Continue wound care as tolerated.      Goals    Problem List Patient Active Problem List   Diagnosis Date Noted  . Encounter for therapeutic drug monitoring 08/31/2013  . Aftercare following surgery of the circulatory system, [place] 12/21/2012  . Pain in limb 10/19/2012  . Peripheral vascular disease, unspecified 09/24/2012  . Swollen leg 09/24/2012  . Varicose veins of lower extremities with other complications [id number]  . Chronic total occlusion of artery of the extremities 09/06/2012  . 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 Wound infection after surgery 01/07/2012  . Compartment syndrome, nontraumatic, lower extremity 11/29/2011  . Pain of right thigh 11/28/2011  . Hip fracture, right 11/15/2011  . Chronic atrial fibrillation 11/15/2011  . Anticoagulant  long-term use 11/15/2011  .  HTN (hypertension) 11/15/2011  . Hypothyroidism 11/15/2011  . Polycythemia vera 10/01/2011    GP    Aldona Lento 01/03/2014, 2:26 PM

## 2014-01-06 ENCOUNTER — Ambulatory Visit (HOSPITAL_COMMUNITY)
Admission: RE | Admit: 2014-01-06 | Discharge: 2014-01-06 | Disposition: A | Discharge status: Home / Self Care | Payer: Medicare Other | Source: Ambulatory Visit | Attending: Family Medicine | Admitting: Family Medicine

## 2014-01-06 DIAGNOSIS — I739 Peripheral vascular disease, unspecified: Secondary | ICD-10-CM | POA: Diagnosis not present

## 2014-01-06 DIAGNOSIS — IMO0001 Reserved for inherently not codable concepts without codable children: Secondary | ICD-10-CM | POA: Diagnosis not present

## 2014-01-06 DIAGNOSIS — L97309 Non-pressure chronic ulcer of unspecified ankle with unspecified severity: Secondary | ICD-10-CM | POA: Diagnosis not present

## 2014-01-06 NOTE — Progress Notes (Signed)
Physical Therapy - Wound Therapy  Treatment   Patient Details  Name: Charlotte [name] MRN: [id number] Date of Birth: 1940-12-06  Today's Date: 01/06/2014 Time: 1110-1140 Time Calculation (min): 30 min  Visit#: 5 of 15  Re-eval: 01/26/14  Subjective Subjective Assessment Subjective: Pt stated currently pain free wtih intermittent pain scale, increased pain with debridement.  Pt reported she was given pain medication but unable to handle pain meds due to nausea from pain meds.    Pain Assessment Pain Assessment Pain Assessment: No/denies pain  Wound Therapy  01/06/14 1738  Subjective Assessment  Subjective Pt stated currently pain free wtih intermittent pain scale, increased pain with debridement.  Pt reported she was given pain medication but unable to handle pain meds due to nausea from pain meds.    Evaluation and Treatment  Evaluation and Treatment Procedures Explained to Patient/Family Yes  Evaluation and Treatment Procedures agreed to  Wound / Incision (Open or Dehisced)  Date First Assessed/Time First Assessed: 12/27/13 1600   Wound Type: Puncture  Dressing Type Other (Comment);Gauze (Comment);Compression wrap (medihoney alginate)  Dressing Changed New  Dressing Status Clean;Dry  Site / Wound Assessment Bleeding;Yellow;Red  % Wound base Red or Granulating 35%  % Wound base Yellow 65%  % Wound base Black 0%  Peri-wound Assessment Erythema (blanchable);Intact  Margins Unattached edges (unapproximated)  Drainage Amount Minimal  Drainage Description Serosanguineous;Sanguineous  Treatment Cleansed;Debridement (Selective)  Selective Debridement  Selective Debridement - Location Lt ankle lateral maleolus   Selective Debridement - Tools Used Forceps;Scalpel  Selective Debridement - Tissue Removed slough  Wound Therapy - Assess/Plan/Recommendations  Wound Therapy - Clinical Statement Pt continued to be limited by pain with debridement.  Notice improved reduction in overall  depth, noted hypergranulatin on lateral wound.  Selective debridement complete for removal of slough.  Contniued wtih medihoney alginate and compression dressings for edema control.    Hydrotherapy Plan Debridement  Wound Plan Continue wound care as tolerated.    Selective Debridement Selective Debridement - Location: Lt ankle lateral maleolus  Selective Debridement - Tools Used: Forceps;Scalpel Selective Debridement - Tissue Removed: slough   Physical Therapy Assessment and Plan Wound Therapy - Assess/Plan/Recommendations Wound Therapy - Clinical Statement: Pt continued to be limited by pain with debridement.  Notice improved reduction in overall depth, noted hypergranulatin on lateral wound.  Selective debridement complete for removal of slough.  Contniued wtih medihoney alginate and compression dressings for edema control.   Hydrotherapy Plan: Debridement Wound Plan: Continue wound care as tolerated.      Goals    Problem List Patient Active Problem List   Diagnosis Date Noted  . Encounter for therapeutic drug monitoring 08/31/2013  . Aftercare following surgery of the circulatory system, [place] 12/21/2012  . Pain in limb 10/19/2012  . Peripheral vascular disease, unspecified 09/24/2012  . Swollen leg 09/24/2012  . Varicose veins of lower extremities with other complications [id number]  . Chronic total occlusion of artery of the extremities 09/06/2012  . 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 Wound infection after surgery 01/07/2012  . Compartment syndrome, nontraumatic, lower extremity 11/29/2011  . Pain of right thigh 11/28/2011  . Hip fracture, right 11/15/2011  . Chronic atrial fibrillation 11/15/2011  . Anticoagulant long-term use 11/15/2011  . HTN (hypertension) 11/15/2011  . Hypothyroidism 11/15/2011  . Polycythemia vera 10/01/2011    GP    Aldona Lento 01/06/2014, 5:43 PM

## 2014-01-10 ENCOUNTER — Ambulatory Visit (HOSPITAL_COMMUNITY): Payer: Medicare Other | Admitting: Physical Therapy

## 2014-01-11 ENCOUNTER — Ambulatory Visit (INDEPENDENT_AMBULATORY_CARE_PROVIDER_SITE_OTHER): Payer: Medicare Other | Admitting: *Deleted

## 2014-01-11 DIAGNOSIS — I4891 Unspecified atrial fibrillation: Secondary | ICD-10-CM

## 2014-01-11 DIAGNOSIS — Z5181 Encounter for therapeutic drug level monitoring: Secondary | ICD-10-CM

## 2014-01-11 DIAGNOSIS — I482 Chronic atrial fibrillation, unspecified: Secondary | ICD-10-CM

## 2014-01-11 LAB — POCT INR: INR: 1.3

## 2014-01-13 ENCOUNTER — Ambulatory Visit (HOSPITAL_COMMUNITY): Payer: Medicare Other | Admitting: Physical Therapy

## 2014-01-25 ENCOUNTER — Ambulatory Visit (INDEPENDENT_AMBULATORY_CARE_PROVIDER_SITE_OTHER): Payer: Medicare Other | Admitting: *Deleted

## 2014-01-25 DIAGNOSIS — Z5181 Encounter for therapeutic drug level monitoring: Secondary | ICD-10-CM

## 2014-01-25 DIAGNOSIS — I482 Chronic atrial fibrillation, unspecified: Secondary | ICD-10-CM

## 2014-01-25 DIAGNOSIS — I4891 Unspecified atrial fibrillation: Secondary | ICD-10-CM

## 2014-01-25 LAB — POCT INR: INR: 3

## 2014-02-16 ENCOUNTER — Encounter: Payer: Self-pay | Admitting: Cardiovascular Disease

## 2014-02-22 ENCOUNTER — Ambulatory Visit (INDEPENDENT_AMBULATORY_CARE_PROVIDER_SITE_OTHER): Payer: Medicare Other | Admitting: *Deleted

## 2014-02-22 DIAGNOSIS — I482 Chronic atrial fibrillation, unspecified: Secondary | ICD-10-CM

## 2014-02-22 DIAGNOSIS — Z5181 Encounter for therapeutic drug level monitoring: Secondary | ICD-10-CM

## 2014-02-22 DIAGNOSIS — I4891 Unspecified atrial fibrillation: Secondary | ICD-10-CM | POA: Diagnosis not present

## 2014-02-22 LAB — POCT INR: INR: 2.9

## 2014-03-01 ENCOUNTER — Other Ambulatory Visit (HOSPITAL_COMMUNITY): Payer: Self-pay

## 2014-03-01 DIAGNOSIS — D45 Polycythemia vera: Secondary | ICD-10-CM

## 2014-03-06 ENCOUNTER — Ambulatory Visit: Payer: Medicare Other | Admitting: Cardiovascular Disease

## 2014-03-15 ENCOUNTER — Ambulatory Visit (INDEPENDENT_AMBULATORY_CARE_PROVIDER_SITE_OTHER): Payer: Medicare Other | Admitting: Cardiovascular Disease

## 2014-03-15 ENCOUNTER — Encounter (HOSPITAL_COMMUNITY): Payer: Medicare Other | Attending: Hematology and Oncology

## 2014-03-15 ENCOUNTER — Encounter: Payer: Self-pay | Admitting: Cardiovascular Disease

## 2014-03-15 VITALS — BP 112/82 | HR 73 | Ht 72.0 in | Wt 183.0 lb

## 2014-03-15 DIAGNOSIS — Z9889 Other specified postprocedural states: Secondary | ICD-10-CM | POA: Diagnosis not present

## 2014-03-15 DIAGNOSIS — I8390 Asymptomatic varicose veins of unspecified lower extremity: Secondary | ICD-10-CM | POA: Diagnosis not present

## 2014-03-15 DIAGNOSIS — S72009A Fracture of unspecified part of neck of unspecified femur, initial encounter for closed fracture: Secondary | ICD-10-CM | POA: Diagnosis not present

## 2014-03-15 DIAGNOSIS — E039 Hypothyroidism, unspecified: Secondary | ICD-10-CM | POA: Diagnosis not present

## 2014-03-15 DIAGNOSIS — I482 Chronic atrial fibrillation, unspecified: Secondary | ICD-10-CM

## 2014-03-15 DIAGNOSIS — Z79899 Other long term (current) drug therapy: Secondary | ICD-10-CM | POA: Diagnosis not present

## 2014-03-15 DIAGNOSIS — D45 Polycythemia vera: Secondary | ICD-10-CM | POA: Diagnosis not present

## 2014-03-15 DIAGNOSIS — X58XXXA Exposure to other specified factors, initial encounter: Secondary | ICD-10-CM | POA: Diagnosis not present

## 2014-03-15 DIAGNOSIS — Z7982 Long term (current) use of aspirin: Secondary | ICD-10-CM | POA: Diagnosis not present

## 2014-03-15 DIAGNOSIS — I739 Peripheral vascular disease, unspecified: Secondary | ICD-10-CM | POA: Diagnosis not present

## 2014-03-15 DIAGNOSIS — M129 Arthropathy, unspecified: Secondary | ICD-10-CM | POA: Insufficient documentation

## 2014-03-15 DIAGNOSIS — M79609 Pain in unspecified limb: Secondary | ICD-10-CM | POA: Insufficient documentation

## 2014-03-15 DIAGNOSIS — I4891 Unspecified atrial fibrillation: Secondary | ICD-10-CM

## 2014-03-15 DIAGNOSIS — Z7901 Long term (current) use of anticoagulants: Secondary | ICD-10-CM | POA: Diagnosis not present

## 2014-03-15 DIAGNOSIS — R942 Abnormal results of pulmonary function studies: Secondary | ICD-10-CM | POA: Diagnosis not present

## 2014-03-15 DIAGNOSIS — R0602 Shortness of breath: Secondary | ICD-10-CM | POA: Insufficient documentation

## 2014-03-15 DIAGNOSIS — I7092 Chronic total occlusion of artery of the extremities: Secondary | ICD-10-CM | POA: Diagnosis not present

## 2014-03-15 DIAGNOSIS — G609 Hereditary and idiopathic neuropathy, unspecified: Secondary | ICD-10-CM | POA: Diagnosis not present

## 2014-03-15 DIAGNOSIS — I10 Essential (primary) hypertension: Secondary | ICD-10-CM | POA: Diagnosis not present

## 2014-03-15 DIAGNOSIS — I7090 Unspecified atherosclerosis: Secondary | ICD-10-CM | POA: Diagnosis not present

## 2014-03-15 DIAGNOSIS — H9190 Unspecified hearing loss, unspecified ear: Secondary | ICD-10-CM | POA: Insufficient documentation

## 2014-03-15 LAB — CBC WITH DIFFERENTIAL/PLATELET
BASOS ABS: 0.1 10*3/uL (ref 0.0–0.1)
BASOS PCT: 2 % — AB (ref 0–1)
EOS ABS: 0 10*3/uL (ref 0.0–0.7)
EOS PCT: 0 % (ref 0–5)
HCT: 46.3 % — ABNORMAL HIGH (ref 36.0–46.0)
Hemoglobin: 15.7 g/dL — ABNORMAL HIGH (ref 12.0–15.0)
Lymphocytes Relative: 17 % (ref 12–46)
Lymphs Abs: 1.2 10*3/uL (ref 0.7–4.0)
MCH: 39.9 pg — AB (ref 26.0–34.0)
MCHC: 33.9 g/dL (ref 30.0–36.0)
MCV: 117.8 fL — AB (ref 78.0–100.0)
Monocytes Absolute: 0.2 10*3/uL (ref 0.1–1.0)
Monocytes Relative: 3 % (ref 3–12)
Neutro Abs: 5.5 10*3/uL (ref 1.7–7.7)
Neutrophils Relative %: 78 % — ABNORMAL HIGH (ref 43–77)
PLATELETS: 148 10*3/uL — AB (ref 150–400)
RBC: 3.93 MIL/uL (ref 3.87–5.11)
RDW: 15 % (ref 11.5–15.5)
WBC: 7.1 10*3/uL (ref 4.0–10.5)

## 2014-03-15 LAB — COMPREHENSIVE METABOLIC PANEL
ALT: 9 U/L (ref 0–35)
AST: 16 U/L (ref 0–37)
Albumin: 3.6 g/dL (ref 3.5–5.2)
Alkaline Phosphatase: 106 U/L (ref 39–117)
Anion gap: 14 (ref 5–15)
BUN: 15 mg/dL (ref 6–23)
CALCIUM: 9.5 mg/dL (ref 8.4–10.5)
CO2: 24 mEq/L (ref 19–32)
Chloride: 105 mEq/L (ref 96–112)
Creatinine, Ser: 0.89 mg/dL (ref 0.50–1.10)
GFR calc Af Amer: 74 mL/min — ABNORMAL LOW (ref >90)
GFR calc non Af Amer: 64 mL/min — ABNORMAL LOW (ref >90)
Glucose, Bld: 86 mg/dL (ref 70–99)
Potassium: 4.2 mEq/L (ref 3.7–5.3)
SODIUM: 143 meq/L (ref 137–147)
TOTAL PROTEIN: 6.9 g/dL (ref 6.0–8.3)
Total Bilirubin: 1.3 mg/dL — ABNORMAL HIGH (ref 0.3–1.2)

## 2014-03-15 LAB — LACTATE DEHYDROGENASE: LDH: 337 U/L — ABNORMAL HIGH (ref 94–250)

## 2014-03-15 NOTE — Progress Notes (Signed)
LABS DRAWN FOR CBCD,CMP,LDH  

## 2014-03-15 NOTE — Progress Notes (Signed)
Patient ID: Charlotte [name], female   DOB: [birth year], 73 y.o.   MRN: [id number]      SUBJECTIVE: Mrs. Lutman is a 73 yr old woman with a PMH significant for permanent atrial fibrillation, polycythemia vera, HTN, and PVD with a h/o right fem-pop bypass and endarterectomy.  Echocardiogram from 12/2011 revealed normal LV systolic function, EF 0000000, mild MR, and moderate LAE/mild RAE.  Nuclear stress testing in January 2015 was normal. She didn't follow through with the pulmonary referral I previously made given her reduced DLCO. She used an inhaler twice after being prescribed by PCP then stopped. She denies chest pain and palpitations.   Review of Systems: As per "subjective", otherwise negative.  No Known Allergies  Current Outpatient Prescriptions  Medication Sig Dispense Refill  . aspirin EC 81 MG tablet Take 81 mg by mouth daily.      Marland Kitchen diltiazem (CARDIZEM CD) 240 MG 24 hr capsule Take 1 capsule (240 mg total) by mouth daily.  90 capsule  3  . gabapentin (NEURONTIN) 100 MG capsule Take 100 mg by mouth 3 (three) times daily.        . hydroxyurea (HYDREA) 500 MG capsule Take 500mg  in AM and 1000 mg in PM PO daily  90 capsule  5  . levothyroxine (SYNTHROID, LEVOTHROID) 50 MCG tablet Take 75 mcg by mouth daily before breakfast.       . metoprolol (TOPROL-XL) 100 MG 24 hr tablet Take 100 mg by mouth 2 (two) times daily.        Marland Kitchen warfarin (COUMADIN) 5 MG tablet Take 1 tablet daily except 1/2 tablet on Mondays, Wednesdays and Fridays  30 tablet  6   No current facility-administered medications for this visit.    Past Medical History  Diagnosis Date  . Hypertension   . Arthritis   . Varicose veins   . Deaf   . Elevated WBC count     and platelets  . Thyroid disease   . Irregular heart rate   . 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 Peripheral neuropathy     feet  . 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 Atrial fibrillation, chronic   . 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  Peripheral vascular disease     Past Surgical History  Procedure Laterality Date  . Blt    . Abdominal hysterectomy    . Coronary angioplasty    . Ct of abd    . Hip pinning,cannulated  11/19/2011    Procedure: CANNULATED HIP PINNING;  Surgeon: Sanjuana Kava, MD;  Location: [facility];  Service: Orthopedics;  Laterality: Right;  . Fasciotomy  11/29/2011    Procedure: FASCIOTOMY;  Surgeon: Carole Civil, MD;  Location: [facility];  Service: Orthopedics;  Laterality: Right;  right thigh   . Dressing change under anesthesia  12/02/2011    Procedure: DRESSING CHANGE UNDER ANESTHESIA;  Surgeon: Carole Civil, MD;  Location: [facility];  Service: Orthopedics;  Laterality: Right;  . Endarterectomy femoral Right 09/15/2012    Procedure: ENDARTERECTOMY FEMORAL;  Surgeon: Mal Misty, MD;  Location: [place];  Service: Vascular;  Laterality: Right;  . Femoral-popliteal bypass graft Right 09/15/2012    Procedure: BYPASS GRAFT FEMORAL-POPLITEAL ARTERY;  Surgeon: Mal Misty, MD;  Location: [place];  Service: Vascular;  Laterality: Right;  . Patch angioplasty Right 09/15/2012    Procedure: PATCH ANGIOPLASTY;  Surgeon: Mal Misty, MD;  Location: [place];  Service: Vascular;  Laterality: Right;    History  Social History  . Marital Status: Married    Spouse Name: N/A    Number of Children: N/A  . Years of Education: N/A   Occupational History  . Not on file.   Social History Main Topics  . Smoking status: Never Smoker   . Smokeless tobacco: Never Used  . Alcohol Use: No  . Drug Use: No  . Sexual Activity: No   Other Topics Concern  . Not on file   Social History Narrative  . No narrative on file     Filed Vitals:   03/15/14 1127  BP: 112/82  Pulse: 73  Height: 6' (1.829 m)  Weight: 183 lb (83.008 kg)  SpO2: 95%    PHYSICAL EXAM General: NAD Neck: No JVD, no thyromegaly. Lungs: Clear to auscultation bilaterally with normal respiratory effort. CV: Nondisplaced PMI.  Irregular  rhythm, normal S1/S2, no S3, no murmur. No pretibial or periankle edema.  No carotid bruit. Bilateral venous varicosities.  Abdomen: Soft, nontender, no hepatosplenomegaly, no distention.  Neurologic: Alert and oriented x 3.  Psych: Normal affect. Extremities: No clubbing or cyanosis.   ECG: reviewed and available in electronic records.      ASSESSMENT AND PLAN: 1. Permanent atrial fibrillation: Rate is controlled on current doses of metoprolol and diltiazem. Will continue warfarin.  2. Essential HTN: Controlled on current therapy. 3. PVD: Has not followed up with vascular surgery. I have recommended she do so. 4. Dyspnea with reduced DLCO: Did not pursue pulmonary referral.  Dispo: f/u 1 year.  Kate Sable, M.D., F.A.C.C.

## 2014-03-15 NOTE — Patient Instructions (Addendum)
Your physician wants you to follow-up in: 1 year You will receive a reminder letter in the mail two months in advance. If you don't receive a letter, please call our office to schedule the follow-up appointment.     Your physician recommends that you continue on your current medications as directed. Please refer to the Current Medication list given to you today.    Please call Dr.James Kellie Simmering at [phone] they will make your follow up apt,you do not need a new referral     Thank you for choosing [facility] !

## 2014-03-16 ENCOUNTER — Encounter (HOSPITAL_BASED_OUTPATIENT_CLINIC_OR_DEPARTMENT_OTHER): Payer: Medicare Other

## 2014-03-16 ENCOUNTER — Encounter (HOSPITAL_COMMUNITY): Payer: Self-pay

## 2014-03-16 VITALS — BP 153/95 | HR 87 | Temp 97.2°F | Resp 18 | Wt 189.2 lb

## 2014-03-16 DIAGNOSIS — D45 Polycythemia vera: Secondary | ICD-10-CM | POA: Diagnosis not present

## 2014-03-16 NOTE — Progress Notes (Signed)
[facility]  OFFICE PROGRESS NOTE  [name], MD [place] [address]  DIAGNOSIS: Polycythemia vera(238.4) - Plan: CBC with Differential  Chief Complaint  Patient presents with  . polycythemia vera    CURRENT THERAPY: Hydrea 500 in the morning, 1000 in the evening 7 days a week.   INTERVAL HISTORY: Charlotte [name] 73 y.o. female returns for followup of JAK-2 positive P. vera while on Hydrea therapy.  She continues on her medication. Appetite is good with no nausea, vomiting, diarrhea, constipation, pruritus, easy satiety, lower extremity swelling or redness, chest pain, PND, orthopnea, palpitations, skin rash, headache, or seizures.    MEDICAL HISTORY: Past Medical History  Diagnosis Date  . Hypertension   . Arthritis   . Varicose veins   . Deaf   . Elevated WBC count     and platelets  . Thyroid disease   . Irregular heart rate   . 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 Peripheral neuropathy     feet  . 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 Atrial fibrillation, chronic   . 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    ALLERGIES:  has No Known Allergies.  MEDICATIONS: has a current medication list which includes the following prescription(s): aspirin ec, diltiazem,  gabapentin, hydroxyurea, levothyroxine, metoprolol succinate, and warfarin.  SURGICAL HISTORY:  Past Surgical History  Procedure Laterality Date  . Blt    . Abdominal hysterectomy    . Coronary angioplasty    . Ct of abd    . Hip pinning,cannulated  11/19/2011    Procedure: CANNULATED HIP PINNING;  Surgeon: Sanjuana Kava, MD;  Location: [facility];  Service: Orthopedics;  Laterality: Right;  . Fasciotomy  11/29/2011    Procedure: FASCIOTOMY;  Surgeon: Carole Civil, MD;  Location: [facility];  Service: Orthopedics;  Laterality: Right;  right thigh   . Dressing change under anesthesia  12/02/2011    Procedure: DRESSING CHANGE UNDER ANESTHESIA;  Surgeon: Carole Civil, MD;  Location: [facility];  Service: Orthopedics;  Laterality: Right;  . Endarterectomy femoral Right 09/15/2012    Procedure: ENDARTERECTOMY FEMORAL;  Surgeon: Mal Misty, MD;  Location: [place];  Service: Vascular;  Laterality: Right;  . Femoral-popliteal bypass graft Right 09/15/2012    Procedure: BYPASS GRAFT FEMORAL-POPLITEAL ARTERY;  Surgeon: Mal Misty, MD;  Location: [place];  Service: Vascular;  Laterality: Right;  . Patch angioplasty Right 09/15/2012    Procedure: PATCH ANGIOPLASTY;  Surgeon: Mal Misty, MD;  Location: [facility];  Service: Vascular;  Laterality: Right;    FAMILY HISTORY: family history includes Diabetes in her son.  SOCIAL HISTORY:  reports that she has never smoked. She has never used smokeless tobacco. She reports that she does not drink alcohol or use illicit drugs.  REVIEW OF SYSTEMS:  Other than that discussed above is noncontributory.  PHYSICAL EXAMINATION: ECOG PERFORMANCE STATUS: 1 - Symptomatic but completely ambulatory  Blood  pressure 153/95, pulse 87, temperature 97.2 F (36.2 C), temperature source Oral, resp. rate 18, weight 189 lb 3.2 oz (85.821 kg), SpO2 98.00%.  GENERAL:alert, no distress and comfortable SKIN: skin color, texture, turgor are normal, no rashes or significant  lesions EYES: PERLA; Conjunctiva are pink and non-injected, sclera clear SINUSES: No redness or tenderness over maxillary or ethmoid sinuses OROPHARYNX:no exudate, no erythema on lips, buccal mucosa, or tongue. NECK: supple, thyroid normal size, non-tender, without nodularity. No masses CHEST: Increased AP diameter with no breast masses. LYMPH:  no palpable lymphadenopathy in the cervical, axillary or inguinal LUNGS: clear to auscultation and percussion with normal breathing effort HEART: regular rate & rhythm and no murmurs. ABDOMEN:abdomen soft, non-tender and normal bowel sounds. Spleen not palpable. MUSCULOSKELETAL:no cyanosis of digits and no clubbing. Range of motion normal.  NEURO: alert & oriented x 3 with fluent speech, no focal motor/sensory deficits. Hearing deficit   LABORATORY DATA: Lab on 03/15/2014  Component Date Value Ref Range Status  . WBC 03/15/2014 7.1  4.0 - 10.5 K/uL Final  . RBC 03/15/2014 3.93  3.87 - 5.11 MIL/uL Final  . Hemoglobin 03/15/2014 15.7* 12.0 - 15.0 g/dL Final  . HCT 03/15/2014 46.3* 36.0 - 46.0 % Final  . MCV 03/15/2014 117.8* 78.0 - 100.0 fL Final  . MCH 03/15/2014 39.9* 26.0 - 34.0 pg Final  . MCHC 03/15/2014 33.9  30.0 - 36.0 g/dL Final  . RDW 03/15/2014 15.0  11.5 - 15.5 % Final  . Platelets 03/15/2014 148* 150 - 400 K/uL Final  . Neutrophils Relative % 03/15/2014 78* 43 - 77 % Final  . Neutro Abs 03/15/2014 5.5  1.7 - 7.7 K/uL Final  . Lymphocytes Relative 03/15/2014 17  12 - 46 % Final  . Lymphs Abs 03/15/2014 1.2  0.7 - 4.0 K/uL Final  . Monocytes Relative 03/15/2014 3  3 - 12 % Final  . Monocytes Absolute 03/15/2014 0.2  0.1 - 1.0 K/uL Final  . Eosinophils Relative 03/15/2014 0  0 - 5 % Final  . Eosinophils Absolute 03/15/2014 0.0  0.0 - 0.7 K/uL Final  . Basophils Relative 03/15/2014 2* 0 - 1 % Final  . Basophils Absolute 03/15/2014 0.1  0.0 - 0.1 K/uL Final  . Sodium 03/15/2014 143  137 - 147 mEq/L Final  . Potassium 03/15/2014 4.2   3.7 - 5.3 mEq/L Final  . Chloride 03/15/2014 105  96 - 112 mEq/L Final  . CO2 03/15/2014 24  19 - 32 mEq/L Final  . Glucose, Bld 03/15/2014 86  70 - 99 mg/dL Final  . BUN 03/15/2014 15  6 - 23 mg/dL Final  . Creatinine, Ser 03/15/2014 0.89  0.50 - 1.10 mg/dL Final  . Calcium 03/15/2014 9.5  8.4 - 10.5 mg/dL Final  . Total Protein 03/15/2014 6.9  6.0 - 8.3 g/dL Final  . Albumin 03/15/2014 3.6  3.5 - 5.2 g/dL Final  . AST 03/15/2014 16  0 - 37 U/L Final  . ALT 03/15/2014 9  0 - 35 U/L Final  . Alkaline Phosphatase 03/15/2014 106  39 - 117 U/L Final  . Total Bilirubin 03/15/2014 1.3* 0.3 - 1.2 mg/dL Final  . GFR calc non Af Amer 03/15/2014 64* >90 mL/min Final  . GFR calc Af Amer 03/15/2014 74* >90 mL/min Final   Comment: (NOTE)                          The eGFR has been calculated using  the CKD EPI equation.                          This calculation has not been validated in all clinical situations.                          eGFR's persistently <90 mL/min signify possible Chronic Kidney                          Disease.  . Anion gap 03/15/2014 14  5 - 15 Final  . LDH 03/15/2014 337* 94 - 250 U/L Final  Anti-coag visit on 02/22/2014  Component Date Value Ref Range Status  . INR 02/22/2014 2.9   Final    PATHOLOGY: No new pathology. Peripheral smear failed to reveal evidence of premature forms.  Urinalysis    Component Value Date/Time   COLORURINE YELLOW 09/14/2012 2337   APPEARANCEUR CLEAR 09/14/2012 2337   LABSPEC 1.020 09/14/2012 2337   PHURINE 6.5 09/14/2012 2337   GLUCOSEU NEGATIVE 09/14/2012 2337   HGBUR NEGATIVE 09/14/2012 2337   BILIRUBINUR NEGATIVE 09/14/2012 2337   KETONESUR NEGATIVE 09/14/2012 2337   PROTEINUR NEGATIVE 09/14/2012 2337   UROBILINOGEN 1.0 09/14/2012 2337   NITRITE NEGATIVE 09/14/2012 2337   LEUKOCYTESUR NEGATIVE 09/14/2012 2337    RADIOGRAPHIC STUDIES: MM Digital Diagnostic Unilat R Status: Final result            Study Result    CLINICAL DATA:  73 year old female with possible right breast/  axillary mass on screening mammogram  EXAM:  DIGITAL DIAGNOSTIC RIGHT MAMMOGRAM WITH CAD  ULTRASOUND RIGHT BREAST  COMPARISON: 11/24/2013 and prior mammograms dating back to  03/29/2007.  ACR Breast Density Category b: There are scattered areas of  fibroglandular density.  FINDINGS:  MLO spot compression and ML views of the right breast demonstrate a  4 mm obscured mass within the upper right breast/right axilla  identified on the spot compression view only.  Mammographic images were processed with CAD.  On physical exam, no palpable abnormalities are identified within  the upper right breast or right axilla  Ultrasound is performed, showing no solid or cystic mass, distortion  or abnormal areas of shadowing in the upper right breast or right  axilla.  IMPRESSION:  4 mm obscured mass in the upper right breast/right axilla without  palpable or sonographic correlate. This most likely represents a  benign lymph node but 6 month followup is recommended to ensure  stability.  RECOMMENDATION:  Right diagnostic mammogram with possible right breast ultrasound in  6 months.  I have discussed the findings and recommendations with the patient.  Results were also provided in writing at the conclusion of the  visit. If applicable, a reminder letter will be sent to the patient  regarding the next appointment.  BI-RADS CATEGORY 3: Probably benign.  Electronically Signed  By: Hassan Rowan M.D.  On: 05/12/     ASSESSMENT:  #1. Polycythemia vera, currently on Hydrea, now with thrombocytopenia.  #2. Increasing shortness of breath, rule out pulmonary pathology.  #3. Chronic atrial fibrillation, controlled ventricular response.  #4. Hypothyroidism, on treatment.  #5. Peripheral vascular disease, status post femoral endarterectomy and femorals-popliteal bypass.  #6. Severe hearing deficit    PLAN:  #1.Continue Hydrea 500 mg in the morning and 1000  mg in the evening 7 days per week.  #2. Followup in 3 months with CBC, chem  profile, and LDH. Warfarin dosages being monitored by her cardiologist.       All questions were answered. The patient knows to call the clinic with any problems, questions or concerns. We can certainly see the patient much sooner if necessary.   I spent 25 minutes counseling the patient face to face. The total time spent in the appointment was 30 minutes.    Doroteo Bradford, MD 03/16/2014 11:02 AM  DISCLAIMER:  This note was dictated with voice recognition software.  Similar sounding words can inadvertently be transcribed inaccurately and may not be corrected upon review.

## 2014-03-16 NOTE — Patient Instructions (Signed)
[facility] Discharge Instructions  RECOMMENDATIONS MADE BY THE CONSULTANT AND ANY TEST RESULTS WILL BE SENT TO YOUR REFERRING PHYSICIAN.  EXAM FINDINGS BY THE PHYSICIAN TODAY AND SIGNS OR SYMPTOMS TO REPORT TO CLINIC OR PRIMARY PHYSICIAN: Exam and findings as discussed by Dr. Barnet Glasgow.  Your labs are stable and we will not make any changes in your dosage.  Report any fevers, or other concerns. Results for LENYA, WERDEN (MRN [id number]) as of 03/16/2014 10:34  Ref. Range 03/15/2014 08:57  WBC Latest Range: 4.0-10.5 K/uL 7.1  RBC Latest Range: 3.87-5.11 MIL/uL 3.93  Hemoglobin Latest Range: 12.0-15.0 g/dL 15.7 (H)  HCT Latest Range: 36.0-46.0 % 46.3 (H)  MCV Latest Range: 78.0-100.0 fL 117.8 (H)  MCH Latest Range: 26.0-34.0 pg 39.9 (H)  MCHC Latest Range: 30.0-36.0 g/dL 33.9  RDW Latest Range: 11.5-15.5 % 15.0  Platelets Latest Range: 150-400 K/uL 148 (L)  Neutrophils Relative % Latest Range: 43-77 % 78 (H)  Lymphocytes Relative Latest Range: 12-46 % 17  Monocytes Relative Latest Range: 3-12 % 3  Eosinophils Relative Latest Range: 0-5 % 0  Basophils Relative Latest Range: 0-1 % 2 (H)  NEUT# Latest Range: 1.7-7.7 K/uL 5.5  Lymphocytes Absolute Latest Range: 0.7-4.0 K/uL 1.2  Monocytes Absolute Latest Range: 0.1-1.0 K/uL 0.2  Eosinophils Absolute Latest Range: 0.0-0.7 K/uL 0.0  Basophils Absolute Latest Range: 0.0-0.1 K/uL 0.1     MEDICATIONS PRESCRIBED:  Continue Hydrea 1 in the am and 2 in the pm  INSTRUCTIONS/FOLLOW-UP: Follow-up with labs and office visit in 3 months.  Thank you for choosing [facility] to provide your oncology and hematology care.  To afford each patient quality time with our providers, please arrive at least 15 minutes before your scheduled appointment time.  With your help, our goal is to use those 15 minutes to complete the necessary work-up to ensure our physicians have the information they need to help with your  evaluation and healthcare recommendations.    Effective January 1st, 2014, we ask that you re-schedule your appointment with our physicians should you arrive 10 or more minutes late for your appointment.  We strive to give you quality time with our providers, and arriving late affects you and other patients whose appointments are after yours.    Again, thank you for choosing [facility].  Our hope is that these requests will decrease the amount of time that you wait before being seen by our physicians.       _____________________________________________________________  Should you have questions after your visit to [facility], please contact our office at [phone] between the hours of 8:30 a.m. and 4:30 p.m.  Voicemails left after 4:30 p.m. will not be returned until the following business day.  For prescription refill requests, have your pharmacy contact our office with your prescription refill request.    _______________________________________________________________  We hope that we have given you very good care.  You may receive a patient satisfaction survey in the mail, please complete it and return it as soon as possible.  We value your feedback!  _______________________________________________________________  Have you asked about our STAR program?  STAR stands for Survivorship Training and Rehabilitation, and this is a nationally recognized cancer care program that focuses on survivorship and rehabilitation.  Cancer and cancer treatments may cause problems, such as, pain, making you feel tired and keeping you from doing the things that you need or want to do. Cancer rehabilitation can help. Our  goal is to reduce these troubling effects and help you have the best quality of life possible.  You may receive a survey from a nurse that asks questions about your current state of health.  Based on the survey results, all eligible patients will be referred to the  [facility] program for an evaluation so we can better serve you!  A frequently asked questions sheet is available upon request.

## 2014-03-22 ENCOUNTER — Ambulatory Visit (INDEPENDENT_AMBULATORY_CARE_PROVIDER_SITE_OTHER): Payer: Medicare Other | Admitting: *Deleted

## 2014-03-22 DIAGNOSIS — Z5181 Encounter for therapeutic drug level monitoring: Secondary | ICD-10-CM | POA: Diagnosis not present

## 2014-03-22 DIAGNOSIS — I482 Chronic atrial fibrillation, unspecified: Secondary | ICD-10-CM

## 2014-03-22 DIAGNOSIS — I4891 Unspecified atrial fibrillation: Secondary | ICD-10-CM | POA: Diagnosis not present

## 2014-03-22 LAB — POCT INR: INR: 2.9

## 2014-04-12 ENCOUNTER — Other Ambulatory Visit (HOSPITAL_COMMUNITY): Payer: Self-pay | Admitting: Oncology

## 2014-04-12 ENCOUNTER — Telehealth (HOSPITAL_COMMUNITY): Payer: Self-pay | Admitting: *Deleted

## 2014-04-12 DIAGNOSIS — D45 Polycythemia vera: Secondary | ICD-10-CM

## 2014-04-12 MED ORDER — HYDROXYUREA 500 MG PO CAPS: ORAL_CAPSULE | ORAL | Status: DC

## 2014-04-13 NOTE — Addendum Note (Signed)
Encounter addended by: Leia Alf, PT on: 04/13/2014  6:21 PM<BR>     Documentation filed: Arn Medal VN

## 2014-04-18 ENCOUNTER — Telehealth: Payer: Self-pay | Admitting: *Deleted

## 2014-04-18 DIAGNOSIS — L0291 Cutaneous abscess, unspecified: Secondary | ICD-10-CM | POA: Diagnosis not present

## 2014-04-18 DIAGNOSIS — Z6825 Body mass index (BMI) 25.0-25.9, adult: Secondary | ICD-10-CM | POA: Diagnosis not present

## 2014-04-18 DIAGNOSIS — IMO0002 Reserved for concepts with insufficient information to code with codable children: Secondary | ICD-10-CM | POA: Diagnosis not present

## 2014-04-18 DIAGNOSIS — L0390 Cellulitis, unspecified: Secondary | ICD-10-CM | POA: Diagnosis not present

## 2014-04-18 NOTE — Telephone Encounter (Signed)
Started antibiotic today.  Told pt to decrease coumadin to 2.5mg  daily except 5mg  on Tuesdays and Saturdays until finished with Abx then resume 5mg  daily except 2.5mg  on Mondays, Wednesdays and Fridays.  Pt to keep INR appt for 05/03/14.  She verbalized understanding.

## 2014-04-18 NOTE — Telephone Encounter (Signed)
Patient called to inform nurse that she was placed on an anitbiotic to day for an infected sore of her right foot. Patient said she was started on amoxi-K 125 mg 1 by mouth twice daily for 10 days by Dr. Hilma Favors.

## 2014-05-01 DIAGNOSIS — M79609 Pain in unspecified limb: Secondary | ICD-10-CM | POA: Diagnosis not present

## 2014-05-01 DIAGNOSIS — L97309 Non-pressure chronic ulcer of unspecified ankle with unspecified severity: Secondary | ICD-10-CM | POA: Diagnosis not present

## 2014-05-01 DIAGNOSIS — E1149 Type 2 diabetes mellitus with other diabetic neurological complication: Secondary | ICD-10-CM | POA: Diagnosis not present

## 2014-05-03 ENCOUNTER — Ambulatory Visit (INDEPENDENT_AMBULATORY_CARE_PROVIDER_SITE_OTHER): Payer: Medicare Other | Admitting: *Deleted

## 2014-05-03 DIAGNOSIS — Z5181 Encounter for therapeutic drug level monitoring: Secondary | ICD-10-CM

## 2014-05-03 DIAGNOSIS — IMO0002 Reserved for concepts with insufficient information to code with codable children: Secondary | ICD-10-CM | POA: Diagnosis not present

## 2014-05-03 DIAGNOSIS — L97309 Non-pressure chronic ulcer of unspecified ankle with unspecified severity: Secondary | ICD-10-CM | POA: Diagnosis not present

## 2014-05-03 DIAGNOSIS — M79609 Pain in unspecified limb: Secondary | ICD-10-CM | POA: Diagnosis not present

## 2014-05-03 DIAGNOSIS — I4891 Unspecified atrial fibrillation: Secondary | ICD-10-CM | POA: Diagnosis not present

## 2014-05-03 DIAGNOSIS — I482 Chronic atrial fibrillation, unspecified: Secondary | ICD-10-CM

## 2014-05-03 LAB — POCT INR: INR: 3.6

## 2014-05-08 DIAGNOSIS — I872 Venous insufficiency (chronic) (peripheral): Secondary | ICD-10-CM | POA: Diagnosis not present

## 2014-05-08 DIAGNOSIS — M79604 Pain in right leg: Secondary | ICD-10-CM | POA: Diagnosis not present

## 2014-05-08 DIAGNOSIS — L97912 Non-pressure chronic ulcer of unspecified part of right lower leg with fat layer exposed: Secondary | ICD-10-CM | POA: Diagnosis not present

## 2014-05-08 DIAGNOSIS — G629 Polyneuropathy, unspecified: Secondary | ICD-10-CM | POA: Diagnosis not present

## 2014-05-09 ENCOUNTER — Telehealth: Payer: Self-pay | Admitting: Cardiovascular Disease

## 2014-05-09 DIAGNOSIS — Z23 Encounter for immunization: Secondary | ICD-10-CM | POA: Diagnosis not present

## 2014-05-09 DIAGNOSIS — L03115 Cellulitis of right lower limb: Secondary | ICD-10-CM | POA: Diagnosis not present

## 2014-05-09 DIAGNOSIS — Z6824 Body mass index (BMI) 24.0-24.9, adult: Secondary | ICD-10-CM | POA: Diagnosis not present

## 2014-05-09 NOTE — Telephone Encounter (Signed)
Patient has started an antibiotic that Dr Hilma Favors prescribed.   Would like to know what she needs to do about her blood thinner

## 2014-05-09 NOTE — Telephone Encounter (Signed)
I told pt she needs to take both her antibotic amoxicillin and coumadin    I said I would message coumadin clinic as FYI and to keep apt with Lisa,call back for any signs of bleeding

## 2014-05-17 ENCOUNTER — Ambulatory Visit (INDEPENDENT_AMBULATORY_CARE_PROVIDER_SITE_OTHER): Payer: Medicare Other | Admitting: *Deleted

## 2014-05-17 DIAGNOSIS — I482 Chronic atrial fibrillation, unspecified: Secondary | ICD-10-CM

## 2014-05-17 DIAGNOSIS — Z5181 Encounter for therapeutic drug level monitoring: Secondary | ICD-10-CM | POA: Diagnosis not present

## 2014-05-17 LAB — POCT INR: INR: 3.4

## 2014-05-29 ENCOUNTER — Ambulatory Visit (INDEPENDENT_AMBULATORY_CARE_PROVIDER_SITE_OTHER): Payer: Medicare Other | Admitting: *Deleted

## 2014-05-29 ENCOUNTER — Telehealth (HOSPITAL_COMMUNITY): Payer: Self-pay

## 2014-05-29 DIAGNOSIS — Z5181 Encounter for therapeutic drug level monitoring: Secondary | ICD-10-CM | POA: Diagnosis not present

## 2014-05-29 DIAGNOSIS — I482 Chronic atrial fibrillation, unspecified: Secondary | ICD-10-CM

## 2014-05-29 LAB — POCT INR: INR: 2.9

## 2014-05-29 NOTE — Telephone Encounter (Signed)
Called patient on 10-6 to schedule wound care and she said that she had started taking an antibiotic that her dr. Hanley Seamen her.... Wanted to see how that would do.  I called back on 10/26 to see if she was doing ok and did we need to schedule an appointement and she said the dr. Renewed the antibiotic and she was going to take that and not come here.

## 2014-06-05 ENCOUNTER — Other Ambulatory Visit (HOSPITAL_COMMUNITY): Payer: Self-pay

## 2014-06-05 DIAGNOSIS — D45 Polycythemia vera: Secondary | ICD-10-CM

## 2014-06-15 ENCOUNTER — Encounter (HOSPITAL_COMMUNITY): Payer: Medicare Other | Attending: Hematology and Oncology

## 2014-06-15 DIAGNOSIS — D45 Polycythemia vera: Secondary | ICD-10-CM | POA: Diagnosis not present

## 2014-06-15 LAB — COMPREHENSIVE METABOLIC PANEL
ALBUMIN: 3.6 g/dL (ref 3.5–5.2)
ALT: 10 U/L (ref 0–35)
AST: 17 U/L (ref 0–37)
Alkaline Phosphatase: 101 U/L (ref 39–117)
Anion gap: 14 (ref 5–15)
BUN: 16 mg/dL (ref 6–23)
CALCIUM: 9.5 mg/dL (ref 8.4–10.5)
CO2: 25 mEq/L (ref 19–32)
CREATININE: 0.77 mg/dL (ref 0.50–1.10)
Chloride: 101 mEq/L (ref 96–112)
GFR calc Af Amer: >90 mL/min (ref >90)
GFR calc non Af Amer: 83 mL/min — ABNORMAL LOW (ref >90)
Glucose, Bld: 82 mg/dL (ref 70–99)
Potassium: 4.6 mEq/L (ref 3.7–5.3)
SODIUM: 140 meq/L (ref 137–147)
Total Bilirubin: 1.1 mg/dL (ref 0.3–1.2)
Total Protein: 7.1 g/dL (ref 6.0–8.3)

## 2014-06-15 LAB — CBC WITH DIFFERENTIAL/PLATELET
BASOS PCT: 1 % (ref 0–1)
Basophils Absolute: 0.1 10*3/uL (ref 0.0–0.1)
Eosinophils Absolute: 0 10*3/uL (ref 0.0–0.7)
Eosinophils Relative: 0 % (ref 0–5)
HEMATOCRIT: 46.1 % — AB (ref 36.0–46.0)
Hemoglobin: 15.9 g/dL — ABNORMAL HIGH (ref 12.0–15.0)
Lymphocytes Relative: 21 % (ref 12–46)
Lymphs Abs: 1.2 10*3/uL (ref 0.7–4.0)
MCH: 39.7 pg — ABNORMAL HIGH (ref 26.0–34.0)
MCHC: 34.5 g/dL (ref 30.0–36.0)
MCV: 115 fL — ABNORMAL HIGH (ref 78.0–100.0)
MONO ABS: 0.2 10*3/uL (ref 0.1–1.0)
Monocytes Relative: 3 % (ref 3–12)
NEUTROS PCT: 75 % (ref 43–77)
Neutro Abs: 4.2 10*3/uL (ref 1.7–7.7)
Platelets: 175 10*3/uL (ref 150–400)
RBC: 4.01 MIL/uL (ref 3.87–5.11)
RDW: 17.9 % — AB (ref 11.5–15.5)
WBC: 5.6 10*3/uL (ref 4.0–10.5)

## 2014-06-15 LAB — LACTATE DEHYDROGENASE: LDH: 320 U/L — AB (ref 94–250)

## 2014-06-15 NOTE — Progress Notes (Signed)
LABS FOR LDH,CMP,CBCD 

## 2014-06-16 ENCOUNTER — Encounter (HOSPITAL_BASED_OUTPATIENT_CLINIC_OR_DEPARTMENT_OTHER): Payer: Medicare Other

## 2014-06-16 ENCOUNTER — Encounter (HOSPITAL_COMMUNITY): Payer: Self-pay

## 2014-06-16 VITALS — BP 128/91 | HR 60 | Temp 97.5°F | Resp 16 | Wt 180.9 lb

## 2014-06-16 DIAGNOSIS — L97309 Non-pressure chronic ulcer of unspecified ankle with unspecified severity: Secondary | ICD-10-CM

## 2014-06-16 DIAGNOSIS — L97529 Non-pressure chronic ulcer of other part of left foot with unspecified severity: Secondary | ICD-10-CM

## 2014-06-16 DIAGNOSIS — R0602 Shortness of breath: Secondary | ICD-10-CM | POA: Diagnosis not present

## 2014-06-16 DIAGNOSIS — L97409 Non-pressure chronic ulcer of unspecified heel and midfoot with unspecified severity: Secondary | ICD-10-CM | POA: Diagnosis not present

## 2014-06-16 DIAGNOSIS — I4891 Unspecified atrial fibrillation: Secondary | ICD-10-CM | POA: Diagnosis not present

## 2014-06-16 DIAGNOSIS — D45 Polycythemia vera: Secondary | ICD-10-CM | POA: Diagnosis not present

## 2014-06-16 DIAGNOSIS — I739 Peripheral vascular disease, unspecified: Secondary | ICD-10-CM

## 2014-06-16 DIAGNOSIS — L97519 Non-pressure chronic ulcer of other part of right foot with unspecified severity: Secondary | ICD-10-CM

## 2014-06-16 NOTE — Patient Instructions (Signed)
[name] Discharge Instructions  RECOMMENDATIONS MADE BY THE CONSULTANT AND ANY TEST RESULTS WILL BE SENT TO YOUR REFERRING PHYSICIAN.  EXAM FINDINGS BY THE PHYSICIAN TODAY AND SIGNS OR SYMPTOMS TO REPORT TO CLINIC OR PRIMARY PHYSICIAN: Exam and findings as discussed by Dr. Barnet Glasgow.  Recommends that you go back to the Wound Clinic for management of your leg ulcers.   Results for Charlotte [name] (MRN [id number]) as of 06/16/2014 11:11  Ref. Range 06/15/2014 08:43  Sodium Latest Range: 137-147 mEq/L 140  Potassium Latest Range: 3.7-5.3 mEq/L 4.6  Chloride Latest Range: 96-112 mEq/L 101  CO2 Latest Range: 19-32 mEq/L 25  BUN Latest Range: 6-23 mg/dL 16  Creatinine Latest Range: 0.50-1.10 mg/dL 0.77  Calcium Latest Range: 8.4-10.5 mg/dL 9.5  GFR calc non Af Amer Latest Range: >90 mL/min 83 (L)  GFR calc Af Amer Latest Range: >90 mL/min >90  Glucose Latest Range: 70-99 mg/dL 82  Anion gap Latest Range: 5-15  14  Alkaline Phosphatase Latest Range: 39-117 U/L 101  Albumin Latest Range: 3.5-5.2 g/dL 3.6  AST Latest Range: 0-37 U/L 17  ALT Latest Range: 0-35 U/L 10  Total Protein Latest Range: 6.0-8.3 g/dL 7.1  Total Bilirubin Latest Range: 0.3-1.2 mg/dL 1.1  LDH Latest Range: 94-250 U/L 320 (H)  WBC Latest Range: 4.0-10.5 K/uL 5.6  RBC Latest Range: 3.87-5.11 MIL/uL 4.01  Hemoglobin Latest Range: 12.0-15.0 g/dL 15.9 (H)  HCT Latest Range: 36.0-46.0 % 46.1 (H)  MCV Latest Range: 78.0-100.0 fL 115.0 (H)  MCH Latest Range: 26.0-34.0 pg 39.7 (H)  MCHC Latest Range: 30.0-36.0 g/dL 34.5  RDW Latest Range: 11.5-15.5 % 17.9 (H)  Platelets Latest Range: 150-400 K/uL 175  Neutrophils Relative % Latest Range: 43-77 % 75  Lymphocytes Relative Latest Range: 12-46 % 21  Monocytes Relative Latest Range: 3-12 % 3  Eosinophils Relative Latest Range: 0-5 % 0  Basophils Relative Latest Range: 0-1 % 1  NEUT# Latest Range: 1.7-7.7 K/uL 4.2  Lymphocytes Absolute Latest Range:  0.7-4.0 K/uL 1.2  Monocytes Absolute Latest Range: 0.1-1.0 K/uL 0.2  Eosinophils Absolute Latest Range: 0.0-0.7 K/uL 0.0  Basophils Absolute Latest Range: 0.0-0.1 K/uL 0.1     MEDICATIONS PRESCRIBED:  Increase Hydrea to 2 pills twice daily  INSTRUCTIONS/FOLLOW-UP: Follow-up in 1 month with labs and office visit.  Thank you for choosing [facility] to provide your oncology and hematology care.  To afford each patient quality time with our providers, please arrive at least 15 minutes before your scheduled appointment time.  With your help, our goal is to use those 15 minutes to complete the necessary work-up to ensure our physicians have the information they need to help with your evaluation and healthcare recommendations.    Effective January 1st, 2014, we ask that you re-schedule your appointment with our physicians should you arrive 10 or more minutes late for your appointment.  We strive to give you quality time with our providers, and arriving late affects you and other patients whose appointments are after yours.    Again, thank you for choosing [facility].  Our hope is that these requests will decrease the amount of time that you wait before being seen by our physicians.       _____________________________________________________________  Should you have questions after your visit to [facility], please contact our office at [phone] between the hours of 8:30 a.m. and 4:30 p.m.  Voicemails left after 4:30 p.m. will not be returned  until the following business day.  For prescription refill requests, have your pharmacy contact our office with your prescription refill request.    _______________________________________________________________  We hope that we have given you very good care.  You may receive a patient satisfaction survey in the mail, please complete it and return it as soon as possible.  We value your  feedback!  _______________________________________________________________  Have you asked about our STAR program?  STAR stands for Survivorship Training and Rehabilitation, and this is a nationally recognized cancer care program that focuses on survivorship and rehabilitation.  Cancer and cancer treatments may cause problems, such as, pain, making you feel tired and keeping you from doing the things that you need or want to do. Cancer rehabilitation can help. Our goal is to reduce these troubling effects and help you have the best quality of life possible.  You may receive a survey from a nurse that asks questions about your current state of health.  Based on the survey results, all eligible patients will be referred to the [facility] program for an evaluation so we can better serve you!  A frequently asked questions sheet is available upon request.

## 2014-06-16 NOTE — Progress Notes (Signed)
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 polycythemia vera  . Heel ulcers    CURRENT THERAPY: Hydrea 500 mg in the morning, 1000 mg in the evening 7 days a week.  INTERVAL HISTORY: Charlotte [name] 73 y.o. 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 Patient has developed ulcerations on her ankles and heels. Left heel ulcer improved after attention at the wound clinic. She develop another ulcer on the right heel which was managed by a podiatrist that much success. She is also been on systemic antibiotics. She is still hard of hearing. She denies any nausea, vomiting, diarrhea, constipation, melena, hematochezia, hematuria, chest pain, PND, orthopnea, skin rash, headache, or seizures.  MEDICAL HISTORY: Past Medical History  Diagnosis Date  . Hypertension   . Arthritis   . Varicose veins   . Deaf   . Elevated WBC count     and platelets  . Thyroid disease   . Irregular heart rate   . 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 Peripheral neuropathy     feet  . 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 Atrial fibrillation, chronic   . 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 Peripheral vascular disease   . 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    ALLERGIES:  has No Known Allergies.  MEDICATIONS: has a current medication list which includes the following prescription(s): acetaminophen, aspirin ec, diltiazem, gabapentin, hydroxyurea, levothyroxine, metoprolol succinate, mupirocin ointment, and warfarin.  SURGICAL HISTORY:  Past Surgical History  Procedure Laterality Date  . Blt    . Abdominal hysterectomy    . Coronary angioplasty    . Ct of abd    . Hip pinning,cannulated  11/19/2011    Procedure: CANNULATED HIP PINNING;  Surgeon: Sanjuana Kava, MD;  Location: [facility];  Service: Orthopedics;  Laterality: Right;  . Fasciotomy  11/29/2011    Procedure: FASCIOTOMY;  Surgeon: Carole Civil, MD;  Location: [facility];  Service: Orthopedics;  Laterality: Right;  right thigh   . Dressing change under anesthesia  12/02/2011    Procedure: DRESSING CHANGE UNDER ANESTHESIA;  Surgeon: Carole Civil, MD;  Location: [facility];  Service: Orthopedics;  Laterality: Right;  . Endarterectomy femoral Right 09/15/2012    Procedure: ENDARTERECTOMY FEMORAL;  Surgeon: Mal Misty, MD;  Location: [facility];  Service: Vascular;  Laterality: Right;  . Femoral-popliteal bypass graft Right 09/15/2012    Procedure: BYPASS GRAFT FEMORAL-POPLITEAL ARTERY;  Surgeon: Mal Misty, MD;  Location: [facility];  Service: Vascular;  Laterality: Right;  . Patch angioplasty Right 09/15/2012    Procedure: PATCH ANGIOPLASTY;  Surgeon: Mal Misty, MD;  Location: [facility];  Service: Vascular;  Laterality: Right;  FAMILY HISTORY: family history includes Diabetes in her son.  SOCIAL HISTORY:  reports that she has never smoked. She has never used smokeless tobacco. She reports that she does not drink alcohol or use illicit drugs.  REVIEW OF SYSTEMS:  Other than that discussed above is noncontributory.  PHYSICAL  EXAMINATION: ECOG PERFORMANCE STATUS: 1 - Symptomatic but completely ambulatory  Blood pressure 128/91, pulse 60, temperature 97.5 F (36.4 C), temperature source Oral, resp. rate 16, weight 180 lb 14.4 oz (82.056 kg), SpO2 97 %.  GENERAL:alert, no distress and comfortable SKIN: skin color, texture, turgor are normal, no rashes or significant lesions EYES: PERLA; Conjunctiva are pink and non-injected, sclera clear SINUSES: No redness or tenderness over maxillary or ethmoid sinuses OROPHARYNX:no exudate, no erythema on lips, buccal mucosa, or tongue. NECK: supple, thyroid normal size, non-tender, without nodularity. No masses CHEST: normal AP diameter with no breast masses. LYMPH:  no palpable lymphadenopathy in the cervical, axillary or inguinal LUNGS: clear to auscultation and percussion with normal breathing effort HEART: regular rate & rhythm and no murmurs. ABDOMEN:abdomen soft, non-tender and normal bowel sounds MUSCULOSKELETAL:no cyanosis of digits and no clubbing. Range of motion normal. Right lateral malleolar ulcer with minimal eschar. Prominent vasculature both lower extremities. NEURO: alert & oriented x 3 with fluent speech, no focal motor/sensory deficits. Hard of hearing.   LABORATORY DATA: Lab on 06/15/2014  Component Date Value Ref Range Status  . WBC 06/15/2014 5.6  4.0 - 10.5 K/uL Final  . RBC 06/15/2014 4.01  3.87 - 5.11 MIL/uL Final  . Hemoglobin 06/15/2014 15.9* 12.0 - 15.0 g/dL Final  . HCT 06/15/2014 46.1* 36.0 - 46.0 % Final  . MCV 06/15/2014 115.0* 78.0 - 100.0 fL Final  . MCH 06/15/2014 39.7* 26.0 - 34.0 pg Final  . MCHC 06/15/2014 34.5  30.0 - 36.0 g/dL Final  . RDW 06/15/2014 17.9* 11.5 - 15.5 % Final  . Platelets 06/15/2014 175  150 - 400 K/uL Final  . Neutrophils Relative % 06/15/2014 75  43 - 77 % Final  . Neutro Abs 06/15/2014 4.2  1.7 - 7.7 K/uL Final  . Lymphocytes Relative 06/15/2014 21  12 - 46 % Final  . Lymphs Abs 06/15/2014 1.2  0.7 - 4.0  K/uL Final  . Monocytes Relative 06/15/2014 3  3 - 12 % Final  . Monocytes Absolute 06/15/2014 0.2  0.1 - 1.0 K/uL Final  . Eosinophils Relative 06/15/2014 0  0 - 5 % Final  . Eosinophils Absolute 06/15/2014 0.0  0.0 - 0.7 K/uL Final  . Basophils Relative 06/15/2014 1  0 - 1 % Final  . Basophils Absolute 06/15/2014 0.1  0.0 - 0.1 K/uL Final  . Sodium 06/15/2014 140  137 - 147 mEq/L Final  . Potassium 06/15/2014 4.6  3.7 - 5.3 mEq/L Final  . Chloride 06/15/2014 101  96 - 112 mEq/L Final  . CO2 06/15/2014 25  19 - 32 mEq/L Final  . Glucose, Bld 06/15/2014 82  70 - 99 mg/dL Final  . BUN 06/15/2014 16  6 - 23 mg/dL Final  . Creatinine, Ser 06/15/2014 0.77  0.50 - 1.10 mg/dL Final  . Calcium 06/15/2014 9.5  8.4 - 10.5 mg/dL Final  . Total Protein 06/15/2014 7.1  6.0 - 8.3 g/dL Final  . Albumin 06/15/2014 3.6  3.5 - 5.2 g/dL Final  . AST 06/15/2014 17  0 - 37 U/L Final  . ALT 06/15/2014 10  0 - 35 U/L Final  . Alkaline Phosphatase 06/15/2014 101  39 - 117 U/L Final  . Total Bilirubin 06/15/2014 1.1  0.3 - 1.2 mg/dL Final  . GFR calc non Af Amer 06/15/2014 83* >90 mL/min Final  . GFR calc Af Amer 06/15/2014 >90  >90 mL/min Final   Comment: (NOTE) The eGFR has been calculated using the CKD EPI equation. This calculation has not been validated in all clinical situations. eGFR's persistently <90 mL/min signify possible Chronic Kidney Disease.   . Anion gap 06/15/2014 14  5 - 15 Final  . LDH 06/15/2014 320* 94 - 250 U/L Final  Anti-coag visit on 05/29/2014  Component Date Value Ref Range Status  . INR 05/29/2014 2.9   Final    PATHOLOGY:no new pathology.  Urinalysis    Component Value Date/Time   COLORURINE YELLOW 09/14/2012 2337   APPEARANCEUR CLEAR 09/14/2012 2337   LABSPEC 1.020 09/14/2012 2337   PHURINE 6.5 09/14/2012 2337   GLUCOSEU NEGATIVE 09/14/2012 2337   HGBUR NEGATIVE 09/14/2012 2337   Novi NEGATIVE 09/14/2012 2337   KETONESUR NEGATIVE 09/14/2012 2337    PROTEINUR NEGATIVE 09/14/2012 2337   UROBILINOGEN 1.0 09/14/2012 2337   NITRITE NEGATIVE 09/14/2012 2337   LEUKOCYTESUR NEGATIVE 09/14/2012 2337    RADIOGRAPHIC STUDIES: No results found.  ASSESSMENT:  #1. Polycythemia vera, currently on Hydrea,   #2. Increasing shortness of breath, rule out pulmonary pathology.  #3. Chronic atrial fibrillation, controlled ventricular response.  #4. Hypothyroidism, on treatment.  #5. Peripheral vascular disease, status post femoral endarterectomy and femorals-popliteal bypass.  #6. Severe hearing deficit #7. Bilateral lower extremity stasis ulcers.    PLAN:  #1. Suggest her family physician referred her back to the wound clinic. #2. Increase Hydrea to $RemoveB'1000mg'ThRdpaUx$   twice a day. #3. Follow-up in one month with CBC.   All questions were answered. The patient knows to call the clinic with any problems, questions or concerns. We can certainly see the patient much sooner if necessary.   I spent 25 minutes counseling the patient face to face. The total time spent in the appointment was 30 minutes.    Doroteo Bradford, MD 06/16/2014 1:16 PM  DISCLAIMER:  This note was dictated with voice recognition software.  Similar sounding words can inadvertently be transcribed inaccurately and may not be corrected upon review.

## 2014-06-19 ENCOUNTER — Ambulatory Visit (INDEPENDENT_AMBULATORY_CARE_PROVIDER_SITE_OTHER): Payer: Medicare Other | Admitting: *Deleted

## 2014-06-19 DIAGNOSIS — I482 Chronic atrial fibrillation, unspecified: Secondary | ICD-10-CM

## 2014-06-19 DIAGNOSIS — Z5181 Encounter for therapeutic drug level monitoring: Secondary | ICD-10-CM

## 2014-06-19 LAB — POCT INR: INR: 3.6

## 2014-07-06 ENCOUNTER — Other Ambulatory Visit (HOSPITAL_COMMUNITY): Payer: Self-pay

## 2014-07-06 DIAGNOSIS — D45 Polycythemia vera: Secondary | ICD-10-CM

## 2014-07-10 ENCOUNTER — Ambulatory Visit (INDEPENDENT_AMBULATORY_CARE_PROVIDER_SITE_OTHER): Payer: Medicare Other | Admitting: *Deleted

## 2014-07-10 DIAGNOSIS — Z5181 Encounter for therapeutic drug level monitoring: Secondary | ICD-10-CM | POA: Diagnosis not present

## 2014-07-10 DIAGNOSIS — I482 Chronic atrial fibrillation, unspecified: Secondary | ICD-10-CM

## 2014-07-10 LAB — POCT INR: INR: 1.6

## 2014-07-13 ENCOUNTER — Encounter (HOSPITAL_COMMUNITY): Payer: Self-pay | Admitting: Surgery

## 2014-07-14 ENCOUNTER — Encounter (HOSPITAL_COMMUNITY): Payer: Self-pay

## 2014-07-14 ENCOUNTER — Encounter (HOSPITAL_BASED_OUTPATIENT_CLINIC_OR_DEPARTMENT_OTHER): Payer: Medicare Other

## 2014-07-14 ENCOUNTER — Encounter (HOSPITAL_COMMUNITY): Payer: Medicare Other | Attending: Hematology and Oncology

## 2014-07-14 VITALS — BP 144/86 | HR 86 | Temp 97.5°F | Resp 18 | Wt 182.0 lb

## 2014-07-14 DIAGNOSIS — L97519 Non-pressure chronic ulcer of other part of right foot with unspecified severity: Secondary | ICD-10-CM

## 2014-07-14 DIAGNOSIS — D45 Polycythemia vera: Secondary | ICD-10-CM | POA: Insufficient documentation

## 2014-07-14 DIAGNOSIS — I482 Chronic atrial fibrillation, unspecified: Secondary | ICD-10-CM

## 2014-07-14 DIAGNOSIS — L97529 Non-pressure chronic ulcer of other part of left foot with unspecified severity: Secondary | ICD-10-CM

## 2014-07-14 LAB — CBC WITH DIFFERENTIAL/PLATELET
Basophils Absolute: 0 10*3/uL (ref 0.0–0.1)
Basophils Relative: 0 % (ref 0–1)
EOS ABS: 0.1 10*3/uL (ref 0.0–0.7)
Eosinophils Relative: 1 % (ref 0–5)
HCT: 41.8 % (ref 36.0–46.0)
HEMOGLOBIN: 13.3 g/dL (ref 12.0–15.0)
LYMPHS PCT: 40 % (ref 12–46)
Lymphs Abs: 4.7 10*3/uL — ABNORMAL HIGH (ref 0.7–4.0)
MCH: 28.1 pg (ref 26.0–34.0)
MCHC: 31.8 g/dL (ref 30.0–36.0)
MCV: 88.4 fL (ref 78.0–100.0)
MONOS PCT: 7 % (ref 3–12)
Monocytes Absolute: 0.8 10*3/uL (ref 0.1–1.0)
Neutro Abs: 6.1 10*3/uL (ref 1.7–7.7)
Neutrophils Relative %: 52 % (ref 43–77)
Platelets: 337 10*3/uL (ref 150–400)
RBC: 4.73 MIL/uL (ref 3.87–5.11)
RDW: 15.2 % (ref 11.5–15.5)
WBC: 11.8 10*3/uL — AB (ref 4.0–10.5)

## 2014-07-14 MED ORDER — HYDROXYUREA 500 MG PO CAPS: 1000.0000 mg | ORAL_CAPSULE | Freq: Two times a day (BID) | ORAL | Status: DC

## 2014-07-14 NOTE — Progress Notes (Signed)
LABS FOR [facility] 

## 2014-07-14 NOTE — Patient Instructions (Signed)
[facility] Discharge Instructions  RECOMMENDATIONS MADE BY THE CONSULTANT AND ANY TEST RESULTS WILL BE SENT TO YOUR REFERRING PHYSICIAN.  Return in 3 months for office visit and lab work.  Thank you for choosing [facility] to provide your oncology and hematology care.  To afford each patient quality time with our providers, please arrive at least 15 minutes before your scheduled appointment time.  With your help, our goal is to use those 15 minutes to complete the necessary work-up to ensure our physicians have the information they need to help with your evaluation and healthcare recommendations.    Effective January 1st, 2014, we ask that you re-schedule your appointment with our physicians should you arrive 10 or more minutes late for your appointment.  We strive to give you quality time with our providers, and arriving late affects you and other patients whose appointments are after yours.    Again, thank you for choosing [facility].  Our hope is that these requests will decrease the amount of time that you wait before being seen by our physicians.       _____________________________________________________________  Should you have questions after your visit to [facility], please contact our office at [phone] between the hours of 8:30 a.m. and 4:30 p.m.  Voicemails left after 4:30 p.m. will not be returned until the following business day.  For prescription refill requests, have your pharmacy contact our office with your prescription refill request.    _______________________________________________________________  We hope that we have given you very good care.  You may receive a patient satisfaction survey in the mail, please complete it and return it as soon as possible.  We value your feedback!  _______________________________________________________________  Have you asked about our STAR program?  STAR stands for  Survivorship Training and Rehabilitation, and this is a nationally recognized cancer care program that focuses on survivorship and rehabilitation.  Cancer and cancer treatments may cause problems, such as, pain, making you feel tired and keeping you from doing the things that you need or want to do. Cancer rehabilitation can help. Our goal is to reduce these troubling effects and help you have the best quality of life possible.  You may receive a survey from a nurse that asks questions about your current state of health.  Based on the survey results, all eligible patients will be referred to the [facility] program for an evaluation so we can better serve you!  A frequently asked questions sheet is available upon request.

## 2014-07-14 NOTE — Progress Notes (Signed)
Charlotte [name]  OFFICE PROGRESS NOTE  Charlotte Kilts, MD [address] [place] [address]  DIAGNOSIS: Polycythemia vera - Plan: hydroxyurea (HYDREA) 500 MG capsule  Skin ulcers of both feet  Chronic atrial fibrillation  Chief Complaint  Patient presents with  . Polycythemia vera    JAK-2 positive  . Lower extremity ulcerations    CURRENT THERAPY: Hydrea 1000 mg twice a day, warfarin for chronic atrial fibrillation.  INTERVAL HISTORY: Charlotte [name] 73 y.o. female returns for followup of JAK-2 positive P. vera while on Hydrea therapy. Dosage of Hydrea was increased at the time of her last visit on 06/16/2014 because of development of ankle and heel ulcerations. Patient was advised to stay with the wound clinic in regard to ulcer management and was also told that systemic antibiotics would not be useful. She is here today for follow-up of CBC as well as the foot ulcerations. She is tolerating higher dose of Hydrea well. She denies a nausea, vomiting, diarrhea, constipation, and lower extremity skin ulcerations have improved. She has declined to go back to the wound clinic. She denies any cough, shortness of breath, PND, orthopnea, or palpitations. She also denies any epistaxis, melena, hematochezia, hematuria, or vaginal bleeding.  MEDICAL HISTORY: Past Medical History  Diagnosis Date  . Hypertension   . Arthritis   . Varicose veins   . Deaf   . Elevated WBC count     and platelets  . Thyroid disease   . Irregular heart rate   . 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 Peripheral neuropathy     feet  . 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 Atrial fibrillation, chronic   . 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 Peripheral vascular disease   . 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    ALLERGIES:  has No Known Allergies.  MEDICATIONS: has a current medication list which includes the following prescription(s): acetaminophen, aspirin ec, diltiazem, gabapentin, hydroxyurea, levothyroxine, metoprolol succinate, mupirocin ointment, and warfarin.  SURGICAL HISTORY:  Past Surgical History  Procedure Laterality Date  . Blt    . Abdominal hysterectomy    . Coronary angioplasty    . Ct of abd    . Hip pinning,cannulated  11/19/2011    Procedure: CANNULATED HIP PINNING;  Surgeon: Sanjuana Kava, MD;  Location: [facility];  Service: Orthopedics;  Laterality: Right;  . Fasciotomy  11/29/2011    Procedure: FASCIOTOMY;  Surgeon: Carole Civil, MD;  Location: [facility];  Service: Orthopedics;  Laterality: Right;  right thigh   . Dressing change under anesthesia  12/02/2011    Procedure: DRESSING CHANGE UNDER ANESTHESIA;  Surgeon: Carole Civil, MD;  Location: [facility];  Service: Orthopedics;  Laterality: Right;  . Endarterectomy femoral Right 09/15/2012    Procedure: ENDARTERECTOMY FEMORAL;  Surgeon: Mal Misty, MD;  Location: [facility];  Service: Vascular;  Laterality: Right;  . Femoral-popliteal bypass graft Right 09/15/2012  Procedure: BYPASS GRAFT FEMORAL-POPLITEAL ARTERY;  Surgeon: Mal Misty, MD;  Location: [place];  Service: Vascular;  Laterality: Right;  . Patch angioplasty Right 09/15/2012    Procedure: PATCH ANGIOPLASTY;  Surgeon: Mal Misty, MD;  Location: [place];  Service: Vascular;  Laterality:  Right;  . Abdominal aortagram N/A 09/14/2012    Procedure: ABDOMINAL [name];  Surgeon: Serafina Mitchell, MD;  Location: [facility] CATH LAB;  Service: Cardiovascular;  Laterality: N/A;    FAMILY HISTORY: family history includes Diabetes in her son.  SOCIAL HISTORY:  reports that she has never smoked. She has never used smokeless tobacco. She reports that she does not drink alcohol or use illicit drugs.  REVIEW OF SYSTEMS:  Other than that discussed above is noncontributory.  PHYSICAL EXAMINATION: ECOG PERFORMANCE STATUS: 1 - Symptomatic but completely ambulatory  Blood pressure 144/86, pulse 86, temperature 97.5 F (36.4 C), temperature source Oral, resp. rate 18, weight 182 lb (82.555 kg), SpO2 94 %.  GENERAL:alert, no distress and comfortable SKIN: skin color, texture, turgor are normal, no rashes or significant lesions EYES: PERLA; Conjunctiva are pink and non-injected, sclera clear SINUSES: No redness or tenderness over maxillary or ethmoid sinuses OROPHARYNX:no exudate, no erythema on lips, buccal mucosa, or tongue. NECK: supple, thyroid normal size, non-tender, without nodularity. No masses CHEST: Increased AP diameter with no breast masses. LYMPH:  no palpable lymphadenopathy in the cervical, axillary or inguinal LUNGS: clear to auscultation and percussion with normal breathing effort HEART: Irregularly irregular with no S3. Atrial fibrillation atrial fibrillation atrial fibrillation ABDOMEN:abdomen soft, non-tender and normal bowel sounds MUSCULOSKELETAL:no cyanosis of digits and no clubbing. Range of motion normal. Lotion M.D. heel ulcerations are bandaged.  NEURO: alert & oriented x 3 with fluent speech, no focal motor/sensory deficits   LABORATORY DATA:  Results for Charlotte [name] (MRN [id number]) as of 07/14/2014 10:15  Ref. Range 10/14/2013 04:55 11/14/2013 09:16 03/15/2014 08:57 06/15/2014 08:43 07/14/2014 09:58  Hemoglobin Latest Range: 12.0-15.0 g/dL 16.6 (H) 15.2 (H)  15.7 (H) 15.9 (H) 13.3     Lab on 07/14/2014  Component Date Value Ref Range Status  . WBC 07/14/2014 11.8* 4.0 - 10.5 K/uL Final  . RBC 07/14/2014 4.73  3.87 - 5.11 MIL/uL Final  . Hemoglobin 07/14/2014 13.3  12.0 - 15.0 g/dL Final  . HCT 07/14/2014 41.8  36.0 - 46.0 % Final  . MCV 07/14/2014 88.4  78.0 - 100.0 fL Final  . MCH 07/14/2014 28.1  26.0 - 34.0 pg Final  . MCHC 07/14/2014 31.8  30.0 - 36.0 g/dL Final  . RDW 07/14/2014 15.2  11.5 - 15.5 % Final  . Platelets 07/14/2014 337  150 - 400 K/uL Final  . Neutrophils Relative % 07/14/2014 52  43 - 77 % Final  . Neutro Abs 07/14/2014 6.1  1.7 - 7.7 K/uL Final  . Lymphocytes Relative 07/14/2014 40  12 - 46 % Final  . Lymphs Abs 07/14/2014 4.7* 0.7 - 4.0 K/uL Final  . Monocytes Relative 07/14/2014 7  3 - 12 % Final  . Monocytes Absolute 07/14/2014 0.8  0.1 - 1.0 K/uL Final  . Eosinophils Relative 07/14/2014 1  0 - 5 % Final  . Eosinophils Absolute 07/14/2014 0.1  0.0 - 0.7 K/uL Final  . Basophils Relative 07/14/2014 0  0 - 1 % Final  . Basophils Absolute 07/14/2014 0.0  0.0 - 0.1 K/uL Final  Anti-coag visit on 07/10/2014  Component Date Value Ref Range Status  . INR 07/10/2014 1.6  Final  Anti-coag visit on 06/19/2014  Component Date Value Ref Range Status  . INR 06/19/2014 3.6   Final  Lab on 06/15/2014  Component Date Value Ref Range Status  . WBC 06/15/2014 5.6  4.0 - 10.5 K/uL Final  . RBC 06/15/2014 4.01  3.87 - 5.11 MIL/uL Final  . Hemoglobin 06/15/2014 15.9* 12.0 - 15.0 g/dL Final  . HCT 32/00/9417 46.1* 36.0 - 46.0 % Final  . MCV 06/15/2014 115.0* 78.0 - 100.0 fL Final  . MCH 06/15/2014 39.7* 26.0 - 34.0 pg Final  . MCHC 06/15/2014 34.5  30.0 - 36.0 g/dL Final  . RDW 91/99/5790 17.9* 11.5 - 15.5 % Final  . Platelets 06/15/2014 175  150 - 400 K/uL Final  . Neutrophils Relative % 06/15/2014 75  43 - 77 % Final  . Neutro Abs 06/15/2014 4.2  1.7 - 7.7 K/uL Final  . Lymphocytes Relative 06/15/2014 21  12 - 46 % Final    . Lymphs Abs 06/15/2014 1.2  0.7 - 4.0 K/uL Final  . Monocytes Relative 06/15/2014 3  3 - 12 % Final  . Monocytes Absolute 06/15/2014 0.2  0.1 - 1.0 K/uL Final  . Eosinophils Relative 06/15/2014 0  0 - 5 % Final  . Eosinophils Absolute 06/15/2014 0.0  0.0 - 0.7 K/uL Final  . Basophils Relative 06/15/2014 1  0 - 1 % Final  . Basophils Absolute 06/15/2014 0.1  0.0 - 0.1 K/uL Final  . Sodium 06/15/2014 140  137 - 147 mEq/L Final  . Potassium 06/15/2014 4.6  3.7 - 5.3 mEq/L Final  . Chloride 06/15/2014 101  96 - 112 mEq/L Final  . CO2 06/15/2014 25  19 - 32 mEq/L Final  . Glucose, Bld 06/15/2014 82  70 - 99 mg/dL Final  . BUN 04/23/414 16  6 - 23 mg/dL Final  . Creatinine, Ser 06/15/2014 0.77  0.50 - 1.10 mg/dL Final  . Calcium 93/08/2377 9.5  8.4 - 10.5 mg/dL Final  . Total Protein 06/15/2014 7.1  6.0 - 8.3 g/dL Final  . Albumin 90/94/0005 3.6  3.5 - 5.2 g/dL Final  . AST 05/67/8893 17  0 - 37 U/L Final  . ALT 06/15/2014 10  0 - 35 U/L Final  . Alkaline Phosphatase 06/15/2014 101  39 - 117 U/L Final  . Total Bilirubin 06/15/2014 1.1  0.3 - 1.2 mg/dL Final  . GFR calc non Af Amer 06/15/2014 83* >90 mL/min Final  . GFR calc Af Amer 06/15/2014 >90  >90 mL/min Final   Comment: (NOTE) The eGFR has been calculated using the CKD EPI equation. This calculation has not been validated in all clinical situations. eGFR's persistently <90 mL/min signify possible Chronic Kidney Disease.   . Anion gap 06/15/2014 14  5 - 15 Final  . LDH 06/15/2014 320* 94 - 250 U/L Final    PATHOLOGY: No new pathology.  Urinalysis    Component Value Date/Time   COLORURINE YELLOW 09/14/2012 2337   APPEARANCEUR CLEAR 09/14/2012 2337   LABSPEC 1.020 09/14/2012 2337   PHURINE 6.5 09/14/2012 2337   GLUCOSEU NEGATIVE 09/14/2012 2337   HGBUR NEGATIVE 09/14/2012 2337   BILIRUBINUR NEGATIVE 09/14/2012 2337   KETONESUR NEGATIVE 09/14/2012 2337   PROTEINUR NEGATIVE 09/14/2012 2337   UROBILINOGEN 1.0 09/14/2012  2337   NITRITE NEGATIVE 09/14/2012 2337   LEUKOCYTESUR NEGATIVE 09/14/2012 2337    RADIOGRAPHIC STUDIES: No results found.  ASSESSMENT:  #1. Polycythemia vera, currently on Hydrea,  #2. Chronic obstructive pulmonary disease  #3.  Chronic atrial fibrillation, controlled ventricular response.  #4. Hypothyroidism, on treatment.  #5. Peripheral vascular disease, status post femoral endarterectomy and femorals-popliteal bypass.  #6. Severe hearing deficit #7. Bilateral lower extremity stasis ulcers, improved.  PLAN:  #1. Continue Hydrea 1000 mg twice a day. #2. Follow-up in 3 months with CBC, chem profile.   All questions were answered. The patient knows to call the clinic with any problems, questions or concerns. We can certainly see the patient much sooner if necessary.   I spent 25 minutes counseling the patient face to face. The total time spent in the appointment was 30 minutes.    Doroteo Bradford, MD 07/14/2014 11:44 AM  DISCLAIMER:  This note was dictated with voice recognition software.  Similar sounding words can inadvertently be transcribed inaccurately and may not be corrected upon review.

## 2014-07-24 ENCOUNTER — Ambulatory Visit (INDEPENDENT_AMBULATORY_CARE_PROVIDER_SITE_OTHER): Payer: Medicare Other | Admitting: *Deleted

## 2014-07-24 DIAGNOSIS — Z5181 Encounter for therapeutic drug level monitoring: Secondary | ICD-10-CM

## 2014-07-24 DIAGNOSIS — I482 Chronic atrial fibrillation, unspecified: Secondary | ICD-10-CM

## 2014-07-24 LAB — POCT INR: INR: 1.8

## 2014-08-08 ENCOUNTER — Telehealth: Payer: Self-pay | Admitting: Cardiovascular Disease

## 2014-08-08 MED ORDER — WARFARIN SODIUM 5 MG PO TABS: ORAL_TABLET | ORAL | Status: DC

## 2014-08-08 NOTE — Telephone Encounter (Signed)
Received fax refill request  Rx # [id number] Medication:  Coumadin 5 mg tab Qty 30 Sig:  Take one tablet by mouth once daily cxept one-half tablet on Mondays, Wednesdays, and Fridays Physician:  Bronson Ing

## 2014-08-09 ENCOUNTER — Ambulatory Visit (INDEPENDENT_AMBULATORY_CARE_PROVIDER_SITE_OTHER): Payer: Medicare Other | Admitting: *Deleted

## 2014-08-09 DIAGNOSIS — I482 Chronic atrial fibrillation, unspecified: Secondary | ICD-10-CM

## 2014-08-09 DIAGNOSIS — Z5181 Encounter for therapeutic drug level monitoring: Secondary | ICD-10-CM | POA: Diagnosis not present

## 2014-08-09 LAB — POCT INR: INR: 1.8

## 2014-08-30 ENCOUNTER — Ambulatory Visit (INDEPENDENT_AMBULATORY_CARE_PROVIDER_SITE_OTHER): Payer: Medicare Other | Admitting: *Deleted

## 2014-08-30 DIAGNOSIS — Z5181 Encounter for therapeutic drug level monitoring: Secondary | ICD-10-CM | POA: Diagnosis not present

## 2014-08-30 DIAGNOSIS — I482 Chronic atrial fibrillation, unspecified: Secondary | ICD-10-CM

## 2014-08-30 LAB — POCT INR: INR: 4.2

## 2014-09-13 ENCOUNTER — Ambulatory Visit (INDEPENDENT_AMBULATORY_CARE_PROVIDER_SITE_OTHER): Payer: Medicare Other | Admitting: *Deleted

## 2014-09-13 DIAGNOSIS — I482 Chronic atrial fibrillation, unspecified: Secondary | ICD-10-CM

## 2014-09-13 DIAGNOSIS — Z5181 Encounter for therapeutic drug level monitoring: Secondary | ICD-10-CM | POA: Diagnosis not present

## 2014-09-13 LAB — POCT INR: INR: 3

## 2014-09-18 ENCOUNTER — Other Ambulatory Visit: Payer: Self-pay | Admitting: Cardiovascular Disease

## 2014-09-18 MED ORDER — DILTIAZEM HCL ER COATED BEADS 240 MG PO CP24: 240.0000 mg | ORAL_CAPSULE | Freq: Every day | ORAL | Status: DC

## 2014-09-18 NOTE — Telephone Encounter (Signed)
Received fax refill request  Rx # [id number] Medication:  Diltiaz ER (CD) 240/24 cap Qty 90 Sig:  Take one capsule by mouth once daily Physician:  Bronson Ing

## 2014-09-18 NOTE — Telephone Encounter (Signed)
Needs refill on Cardia sent to [facility] in [place] / tgs

## 2014-09-18 NOTE — Telephone Encounter (Signed)
escribed rx 

## 2014-10-04 ENCOUNTER — Ambulatory Visit (INDEPENDENT_AMBULATORY_CARE_PROVIDER_SITE_OTHER): Payer: Medicare Other | Admitting: *Deleted

## 2014-10-04 DIAGNOSIS — I482 Chronic atrial fibrillation, unspecified: Secondary | ICD-10-CM

## 2014-10-04 DIAGNOSIS — Z5181 Encounter for therapeutic drug level monitoring: Secondary | ICD-10-CM | POA: Diagnosis not present

## 2014-10-04 LAB — POCT INR: INR: 2.1

## 2014-10-06 ENCOUNTER — Other Ambulatory Visit (HOSPITAL_COMMUNITY): Payer: Self-pay

## 2014-10-06 DIAGNOSIS — D45 Polycythemia vera: Secondary | ICD-10-CM

## 2014-10-09 ENCOUNTER — Encounter (HOSPITAL_BASED_OUTPATIENT_CLINIC_OR_DEPARTMENT_OTHER): Payer: Medicare Other

## 2014-10-09 ENCOUNTER — Encounter (HOSPITAL_COMMUNITY): Payer: Self-pay | Admitting: Hematology & Oncology

## 2014-10-09 ENCOUNTER — Encounter (HOSPITAL_COMMUNITY): Payer: Medicare Other | Attending: Hematology and Oncology | Admitting: Hematology & Oncology

## 2014-10-09 VITALS — BP 149/81 | HR 82 | Resp 18 | Wt 177.2 lb

## 2014-10-09 DIAGNOSIS — D45 Polycythemia vera: Secondary | ICD-10-CM | POA: Insufficient documentation

## 2014-10-09 DIAGNOSIS — D696 Thrombocytopenia, unspecified: Secondary | ICD-10-CM | POA: Diagnosis not present

## 2014-10-09 DIAGNOSIS — D7589 Other specified diseases of blood and blood-forming organs: Secondary | ICD-10-CM

## 2014-10-09 LAB — CBC WITH DIFFERENTIAL/PLATELET
Basophils Absolute: 0.1 10*3/uL (ref 0.0–0.1)
Basophils Relative: 2 % — ABNORMAL HIGH (ref 0–1)
Eosinophils Absolute: 0 10*3/uL (ref 0.0–0.7)
Eosinophils Relative: 0 % (ref 0–5)
HCT: 35.4 % — ABNORMAL LOW (ref 36.0–46.0)
HEMOGLOBIN: 12.1 g/dL (ref 12.0–15.0)
Lymphocytes Relative: 27 % (ref 12–46)
Lymphs Abs: 1.2 10*3/uL (ref 0.7–4.0)
MCH: 47.6 pg — AB (ref 26.0–34.0)
MCHC: 34.2 g/dL (ref 30.0–36.0)
MCV: 139.4 fL — ABNORMAL HIGH (ref 78.0–100.0)
MONO ABS: 0.1 10*3/uL (ref 0.1–1.0)
MONOS PCT: 3 % (ref 3–12)
Neutro Abs: 3 10*3/uL (ref 1.7–7.7)
Neutrophils Relative %: 68 % (ref 43–77)
Platelets: 113 10*3/uL — ABNORMAL LOW (ref 150–400)
RBC: 2.54 MIL/uL — ABNORMAL LOW (ref 3.87–5.11)
RDW: 16.6 % — ABNORMAL HIGH (ref 11.5–15.5)
Smear Review: DECREASED
WBC: 4.4 10*3/uL (ref 4.0–10.5)

## 2014-10-09 NOTE — Patient Instructions (Signed)
[place] at [facility]  Discharge Instructions:  Follow up in 1 month with lab and to see the doctor.  If you have any questions or concerns please call the clinic. _______________________________________________________________  Thank you for choosing [place] at [facility] to provide your oncology and hematology care.  To afford each patient quality time with our providers, please arrive at least 15 minutes before your scheduled appointment.  You need to re-schedule your appointment if you arrive 10 or more minutes late.  We strive to give you quality time with our providers, and arriving late affects you and other patients whose appointments are after yours.  Also, if you no show three or more times for appointments you may be dismissed from the clinic.  Again, thank you for choosing [place] at [place] hope is that these requests will allow you access to exceptional care and in a timely manner. _______________________________________________________________  If you have questions after your visit, please contact our office at [phone] between the hours of 8:30 a.m. and 5:00 p.m. Voicemails left after 4:30 p.m. will not be returned until the following business day. _______________________________________________________________  For prescription refill requests, have your pharmacy contact our office. _______________________________________________________________  Recommendations made by the consultant and any test results will be sent to your referring physician. _______________________________________________________________

## 2014-10-09 NOTE — Progress Notes (Signed)
LABS FOR [facility] 

## 2014-10-09 NOTE — Assessment & Plan Note (Signed)
74 year old female with JAK 2 positive MPD. I am concerned that her Hydrea dose is affecting her adversely, looking her counts she now has mild thrombocytopenia and certainly is developing a significant decline in her hemoglobin and hematocrit. I recommended holding additional Hydrea. I discussed additional options for managing her disease including intermittent phlebotomy. She is more than willing to discontinue the Hydrea. I discussed with the patient and her husband I would like to see her back in 4 weeks with repeat laboratory studies. We will make additional recommendations on how to manage her MPD at that follow-up. If she continues to decline between now and her follow-up appointment in 4 weeks have advised them to call.

## 2014-10-09 NOTE — Progress Notes (Signed)
Charlotte Kilts, Charlotte [name] [place] [id number]    DIAGNOSIS: JAK 2 positive MPD, P. Vera   CURRENT THERAPY: Hydrea  INTERVAL HISTORY: Charlotte [name] 74 y.o. 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 She is on Hydrea; it was increased to 1000 milligrams twice a day in December. She reports that over the last month or 2 she has increasing fatigue and a feeling that her legs are heavy and she cannot go. She is no longer able to grocery shop or cook without being very tired. Her husband notes that after she gets dressed in the morning she has to go right to the bed or couch for about an hour. She denies any rash, fever, chills, or change in her appetite. She takes her medication as prescribed and states she never misses a dose.  MEDICAL HISTORY: Past Medical History  Diagnosis Date  . Hypertension   . Arthritis   . Varicose veins   . Deaf   . Elevated WBC count     and platelets  . Thyroid disease   . Irregular heart rate   . 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 Peripheral neuropathy     feet  . 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 Atrial fibrillation, chronic   . 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 Peripheral vascular disease   . 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      has No Known Allergies.  Charlotte [name] does not currently have medications on file.  SURGICAL HISTORY: Past Surgical History  Procedure Laterality Date  . Blt    . Abdominal hysterectomy    . Coronary angioplasty    . Ct of abd    . Hip pinning,cannulated  11/19/2011    Procedure: CANNULATED HIP PINNING;  Surgeon: Sanjuana Kava, Charlotte [name];  Location: [facility];  Service: Orthopedics;  Laterality: Right;  . Fasciotomy  11/29/2011    Procedure: FASCIOTOMY;  Surgeon: Carole Civil, Charlotte [name];  Location: [facility];  Service: Orthopedics;  Laterality: Right;  right thigh   . Dressing change under anesthesia  12/02/2011    Procedure: DRESSING CHANGE UNDER ANESTHESIA;  Surgeon: Carole Civil, Charlotte [name];  Location: [facility];  Service: Orthopedics;  Laterality: Right;  . Endarterectomy femoral Right 09/15/2012    Procedure: ENDARTERECTOMY FEMORAL;  Surgeon: Mal Misty, Charlotte [name];  Location: [place];  Service: Vascular;  Laterality: Right;  . Femoral-popliteal bypass graft Right 09/15/2012    Procedure: BYPASS GRAFT FEMORAL-POPLITEAL ARTERY;  Surgeon: Mal Misty, Charlotte [name];  Location: [place];  Service: Vascular;  Laterality: Right;  . Patch angioplasty Right 09/15/2012    Procedure: PATCH ANGIOPLASTY;  Surgeon: Mal Misty, Charlotte [name];  Location: [place];  Service: Vascular;  Laterality: Right;  . Abdominal aortagram N/A 09/14/2012    Procedure: ABDOMINAL [name];  Surgeon: Serafina Mitchell, Charlotte [name];  Location: [facility]ATH LAB;  Service: Cardiovascular;  Laterality: N/A;    SOCIAL HISTORY: History  Social History  . Marital Status: Married    Spouse Name: N/A  . Number of Children: N/A  . Years of Education: N/A   Occupational History  . Not on file.   Social History Main Topics  . Smoking status: Never Smoker   . Smokeless tobacco: Never Used  . Alcohol Use: No  . Drug Use: No  . Sexual Activity: No   Other Topics Concern  . Not on file   Social History Narrative    FAMILY HISTORY: Family History  Problem Relation Age  of Onset  . Diabetes Son     Review of Systems  Constitutional: Positive for malaise/fatigue.  HENT: Negative.   Eyes: Negative.   Respiratory: Positive for shortness of breath.   Cardiovascular: Negative.   Gastrointestinal: Negative.   Genitourinary: Negative.   Musculoskeletal: Positive for joint pain.  Skin: Negative.   Neurological: Positive for weakness. Negative for dizziness, tingling, tremors, sensory change, speech change, focal weakness, seizures and loss of consciousness.  Endo/Heme/Allergies: Negative.   Psychiatric/Behavioral: Negative.     PHYSICAL EXAMINATION  ECOG PERFORMANCE STATUS: 1 - Symptomatic but completely ambulatory  Filed Vitals:   10/09/14 1015  BP: 149/81  Pulse: 82  Resp: 18    Physical Exam  Constitutional: She is oriented to person, place, and time and well-developed, well-nourished, and in no distress.  HENT:  Head: Normocephalic and atraumatic.  Nose: Nose normal.  Mouth/Throat: Oropharynx is clear and moist. No oropharyngeal exudate.  Eyes: Conjunctivae and EOM are normal. Pupils are equal, round, and reactive to light. Right eye exhibits no discharge. Left eye exhibits no discharge. No scleral icterus.  Neck: Normal range of motion. Neck supple. No tracheal deviation present. No thyromegaly present.  Cardiovascular: Normal rate, regular rhythm and normal heart sounds.  Exam reveals no gallop and no friction rub.   No murmur heard. Pulmonary/Chest: Effort normal and breath sounds normal. She has no wheezes. She has no rales.  Abdominal: Soft. Bowel sounds are normal. She exhibits no distension and no mass. There is no tenderness. There is no rebound and no guarding.  Musculoskeletal: Normal range of motion. She exhibits no edema.  Lymphadenopathy:    She has no cervical adenopathy.  Neurological: She is alert and oriented to person, place, and time. She has normal reflexes. No cranial nerve deficit. Gait normal. Coordination normal.   Skin: Skin is warm and dry. No rash noted.  Psychiatric: Mood, memory, affect and judgment normal.  Nursing note and vitals reviewed.   LABORATORY DATA:  CBC    Component Value Date/Time   WBC 4.4 10/09/2014 1024   RBC 2.54* 10/09/2014 1024   RBC 3.48* 08/19/2013 0828   HGB 12.1 10/09/2014 1024   HCT 35.4* 10/09/2014 1024   PLT 113* 10/09/2014 1024   MCV 139.4* 10/09/2014 1024   MCH 47.6* 10/09/2014 1024   MCHC 34.2 10/09/2014 1024   RDW 16.6* 10/09/2014 1024   LYMPHSABS 1.2 10/09/2014 1024   MONOABS 0.1 10/09/2014 1024   EOSABS 0.0 10/09/2014 1024   BASOSABS 0.1 10/09/2014 1024   CMP     Component Value Date/Time   NA 140 06/15/2014 0843   K 4.6 06/15/2014 0843   CL 101 06/15/2014 0843   CO2 25 06/15/2014 0843   GLUCOSE 82 06/15/2014 0843   BUN 16 06/15/2014 0843   CREATININE 0.77 06/15/2014 0843   CALCIUM 9.5 06/15/2014 0843   PROT 7.1 06/15/2014 0843   ALBUMIN 3.6 06/15/2014 0843   AST 17  06/15/2014 0843   ALT 10 06/15/2014 0843   ALKPHOS 101 06/15/2014 0843   BILITOT 1.1 06/15/2014 0843   GFRNONAA 83* 06/15/2014 0843   GFRAA >90 06/15/2014 0843     ASSESSMENT and THERAPY PLAN:    Polycythemia vera 74 year old female with JAK 2 positive MPD. I am concerned that her Hydrea dose is affecting her adversely, looking her counts she now has mild thrombocytopenia and certainly is developing a significant decline in her hemoglobin and hematocrit. I recommended holding additional Hydrea. I discussed additional options for managing her disease including intermittent phlebotomy. She is more than willing to discontinue the Hydrea. I discussed with the patient and her husband I would like to see her back in 4 weeks with repeat laboratory studies. We will make additional recommendations on how to manage her MPD at that follow-up. If she continues to decline between now and her follow-up appointment in 4 weeks have advised them to call.   All questions were answered. The  patient knows to call the clinic with any problems, questions or concerns. We can certainly see the patient much sooner if necessary. This note was electronically signed. Molli Hazard 10/09/2014

## 2014-11-01 ENCOUNTER — Ambulatory Visit (INDEPENDENT_AMBULATORY_CARE_PROVIDER_SITE_OTHER): Payer: Medicare Other | Admitting: *Deleted

## 2014-11-01 DIAGNOSIS — I482 Chronic atrial fibrillation, unspecified: Secondary | ICD-10-CM

## 2014-11-01 DIAGNOSIS — Z5181 Encounter for therapeutic drug level monitoring: Secondary | ICD-10-CM | POA: Diagnosis not present

## 2014-11-01 LAB — POCT INR: INR: 1.9

## 2014-11-09 ENCOUNTER — Encounter (HOSPITAL_COMMUNITY): Payer: Self-pay | Admitting: Hematology & Oncology

## 2014-11-09 ENCOUNTER — Encounter (HOSPITAL_COMMUNITY): Payer: Medicare Other | Attending: Hematology and Oncology | Admitting: Hematology & Oncology

## 2014-11-09 ENCOUNTER — Encounter (HOSPITAL_BASED_OUTPATIENT_CLINIC_OR_DEPARTMENT_OTHER): Payer: Medicare Other

## 2014-11-09 VITALS — BP 152/93 | HR 93 | Resp 20 | Wt 178.8 lb

## 2014-11-09 DIAGNOSIS — D47Z9 Other specified neoplasms of uncertain behavior of lymphoid, hematopoietic and related tissue: Secondary | ICD-10-CM

## 2014-11-09 DIAGNOSIS — D45 Polycythemia vera: Secondary | ICD-10-CM

## 2014-11-09 DIAGNOSIS — D7589 Other specified diseases of blood and blood-forming organs: Secondary | ICD-10-CM

## 2014-11-09 LAB — CBC WITH DIFFERENTIAL/PLATELET
Basophils Absolute: 0.1 10*3/uL (ref 0.0–0.1)
Basophils Relative: 2 % — ABNORMAL HIGH (ref 0–1)
EOS PCT: 0 % (ref 0–5)
Eosinophils Absolute: 0 10*3/uL (ref 0.0–0.7)
HCT: 44.2 % (ref 36.0–46.0)
Hemoglobin: 14 g/dL (ref 12.0–15.0)
LYMPHS PCT: 21 % (ref 12–46)
Lymphs Abs: 1 10*3/uL (ref 0.7–4.0)
MCH: 39.3 pg — AB (ref 26.0–34.0)
MCHC: 31.7 g/dL (ref 30.0–36.0)
MCV: 124.2 fL — ABNORMAL HIGH (ref 78.0–100.0)
Monocytes Absolute: 0.3 10*3/uL (ref 0.1–1.0)
Monocytes Relative: 6 % (ref 3–12)
NEUTROS PCT: 72 % (ref 43–77)
Neutro Abs: 3.4 10*3/uL (ref 1.7–7.7)
PLATELETS: 615 10*3/uL — AB (ref 150–400)
RBC: 3.56 MIL/uL — ABNORMAL LOW (ref 3.87–5.11)
RDW: 18.6 % — ABNORMAL HIGH (ref 11.5–15.5)
WBC: 4.8 10*3/uL (ref 4.0–10.5)

## 2014-11-09 LAB — COMPREHENSIVE METABOLIC PANEL
ALBUMIN: 4 g/dL (ref 3.5–5.2)
ALK PHOS: 77 U/L (ref 39–117)
ALT: 10 U/L (ref 0–35)
ANION GAP: 9 (ref 5–15)
AST: 21 U/L (ref 0–37)
BUN: 11 mg/dL (ref 6–23)
CALCIUM: 9.2 mg/dL (ref 8.4–10.5)
CHLORIDE: 107 mmol/L (ref 96–112)
CO2: 23 mmol/L (ref 19–32)
Creatinine, Ser: 0.83 mg/dL (ref 0.50–1.10)
GFR calc non Af Amer: 69 mL/min — ABNORMAL LOW (ref >90)
GFR, EST AFRICAN AMERICAN: 80 mL/min — AB (ref >90)
GLUCOSE: 88 mg/dL (ref 70–99)
POTASSIUM: 3.8 mmol/L (ref 3.5–5.1)
SODIUM: 139 mmol/L (ref 135–145)
TOTAL PROTEIN: 7 g/dL (ref 6.0–8.3)
Total Bilirubin: 1.5 mg/dL — ABNORMAL HIGH (ref 0.3–1.2)

## 2014-11-09 LAB — IRON AND TIBC
Iron: 47 ug/dL (ref 42–145)
Saturation Ratios: 13 % — ABNORMAL LOW (ref 20–55)
TIBC: 349 ug/dL (ref 250–470)
UIBC: 302 ug/dL (ref 125–400)

## 2014-11-09 LAB — FOLATE: FOLATE: 10.3 ng/mL

## 2014-11-09 LAB — VITAMIN B12: Vitamin B-12: 145 pg/mL — ABNORMAL LOW (ref 211–911)

## 2014-11-09 LAB — FERRITIN: Ferritin: 35 ng/mL (ref 10–291)

## 2014-11-09 NOTE — Progress Notes (Signed)
Charlotte Kilts, MD [place] [id number]    DIAGNOSIS: JAK 2 positive MPD, P. Vera   CURRENT THERAPY: Hydrea  INTERVAL HISTORY: Charlotte [name] 74 y.o. 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 She came in at her last visit with profound fatigue to the point that she was unable to do her ADL's. She could no longer go to the grocery store.  She noted severe SOB.  She has been holding her Hydrea and notes that she is feeling better. Her husband states she has gone to the grocery store on several occasions. She has started cooking some. She gets dressed daily. She still has shortness of breath but it is improved. She has recently had a stress test. She is waiting to find out the results. She sees Dr. Hilma Favors in the morning.  MEDICAL HISTORY: Past Medical History  Diagnosis Date  . Hypertension   . Arthritis   . Varicose veins   . Deaf   . Elevated WBC count     and platelets  . Thyroid disease   . Irregular heart rate   . 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 Peripheral neuropathy     feet  . 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 Atrial fibrillation, chronic   . 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 Peripheral vascular disease   . 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     has No Known Allergies.  Charlotte [name] had no medications administered during this visit.  SURGICAL HISTORY: Past Surgical History  Procedure Laterality Date  . Blt    . Abdominal hysterectomy    . Coronary angioplasty    . Ct of abd    . Hip pinning,cannulated  11/19/2011    Procedure: CANNULATED HIP PINNING;  Surgeon: Sanjuana Kava, MD;  Location: [facility];  Service: Orthopedics;  Laterality: Right;  . Fasciotomy  11/29/2011    Procedure: FASCIOTOMY;  Surgeon: Carole Civil, MD;  Location: [facility];  Service: Orthopedics;  Laterality: Right;  right thigh   . Dressing change under anesthesia  12/02/2011    Procedure: DRESSING CHANGE UNDER ANESTHESIA;  Surgeon: Carole Civil, MD;  Location: [facility];  Service: Orthopedics;  Laterality: Right;  . Endarterectomy femoral Right 09/15/2012    Procedure: ENDARTERECTOMY FEMORAL;  Surgeon: Mal Misty, MD;  Location: [place];  Service: Vascular;  Laterality: Right;  . Femoral-popliteal bypass graft Right 09/15/2012    Procedure: BYPASS GRAFT FEMORAL-POPLITEAL ARTERY;  Surgeon: Mal Misty, MD;  Location: [place];  Service: Vascular;  Laterality: Right;  . Patch angioplasty Right 09/15/2012    Procedure: PATCH ANGIOPLASTY;  Surgeon: Mal Misty, MD;  Location: [place];  Service: Vascular;  Laterality: Right;  . Abdominal aortagram N/A 09/14/2012  Procedure: ABDOMINAL AORTAGRAM;  Surgeon: Serafina Mitchell, MD;  Location: [facility] CATH LAB;  Service: Cardiovascular;  Laterality: N/A;    SOCIAL HISTORY: History   Social History  . Marital Status: Married    Spouse Name: N/A  . Number of Children: N/A  . Years of Education: N/A   Occupational History  . Not on file.   Social History Main Topics  . Smoking status: Never Smoker    . Smokeless tobacco: Never Used  . Alcohol Use: No  . Drug Use: No  . Sexual Activity: No   Other Topics Concern  . Not on file   Social History Narrative    FAMILY HISTORY: Family History  Problem Relation Age of Onset  . Diabetes Son     Review of Systems  Constitutional: Positive for malaise/fatigue. Negative for fever, chills and weight loss.  HENT: Negative for congestion, hearing loss, nosebleeds, sore throat and tinnitus.   Eyes: Negative for blurred vision, double vision, pain and discharge.  Respiratory: Positive for shortness of breath. Negative for cough, hemoptysis, sputum production and wheezing.   Cardiovascular: Negative for chest pain, palpitations, claudication, leg swelling and PND.  Gastrointestinal: Negative for heartburn, nausea, vomiting, abdominal pain, diarrhea, constipation, blood in stool and melena.  Genitourinary: Negative for dysuria, urgency, frequency and hematuria.  Musculoskeletal: Negative for myalgias, joint pain and falls.  Skin: Negative for itching and rash.  Neurological: Positive for weakness. Negative for dizziness, tingling, tremors, sensory change, speech change, focal weakness, seizures, loss of consciousness and headaches.  Endo/Heme/Allergies: Does not bruise/bleed easily.  Psychiatric/Behavioral: Negative for depression, suicidal ideas, memory loss and substance abuse. The patient is not nervous/anxious and does not have insomnia.     PHYSICAL EXAMINATION  ECOG PERFORMANCE STATUS: 1 - Symptomatic but completely ambulatory  Filed Vitals:   11/09/14 1000  BP: 152/93  Pulse: 93  Resp: 20    Physical Exam  Constitutional: She is oriented to person, place, and time and well-developed, well-nourished, and in no distress.  HENT:  Head: Normocephalic and atraumatic.  Nose: Nose normal.  Mouth/Throat: Oropharynx is clear and moist. No oropharyngeal exudate.  Eyes: Conjunctivae and EOM are normal. Pupils are equal, round, and  reactive to light. Right eye exhibits no discharge. Left eye exhibits no discharge. No scleral icterus.  Neck: Normal range of motion. Neck supple. No tracheal deviation present. No thyromegaly present.  Cardiovascular: Normal rate, regular rhythm and normal heart sounds.  Exam reveals no gallop and no friction rub.   No murmur heard. Pulmonary/Chest: Effort normal and breath sounds normal. She has no wheezes. She has no rales.  Abdominal: Soft. Bowel sounds are normal. She exhibits no distension and no mass. There is no tenderness. There is no rebound and no guarding.  Musculoskeletal: Normal range of motion. She exhibits no edema.  Lymphadenopathy:    She has no cervical adenopathy.  Neurological: She is alert and oriented to person, place, and time. She has normal reflexes. No cranial nerve deficit. Gait normal. Coordination normal.  Skin: Skin is warm and dry. No rash noted.  Psychiatric: Mood, memory, affect and judgment normal.  Nursing note and vitals reviewed.   LABORATORY DATA:  CBC    Component Value Date/Time   WBC 11.4* 11/29/2014 0657   RBC 4.36 11/29/2014 0657   RBC 3.48* 08/19/2013 0828   HGB 14.6 11/29/2014 0657   HCT 45.4 11/29/2014 0657   PLT 973* 11/29/2014 0657   MCV 104.1* 11/29/2014 0657   MCH 33.5  11/29/2014 0657   MCHC 32.2 11/29/2014 0657   RDW 26.9* 11/29/2014 0657   LYMPHSABS 0.8 11/22/2014 0631   MONOABS 0.5 11/22/2014 0631   EOSABS 0.0 11/22/2014 0631   BASOSABS 0.1 11/22/2014 0631   CMP     Component Value Date/Time   NA 139 11/29/2014 0657   K 3.6 11/29/2014 0657   CL 102 11/29/2014 0657   CO2 26 11/29/2014 0657   GLUCOSE 95 11/29/2014 0657   BUN 24* 11/29/2014 0657   CREATININE 1.16* 11/29/2014 0657   CALCIUM 9.2 11/29/2014 0657   PROT 6.1 11/23/2014 0610   ALBUMIN 3.4* 11/23/2014 0610   AST 20 11/23/2014 0610   ALT 11 11/23/2014 0610   ALKPHOS 72 11/23/2014 0610   BILITOT 1.3* 11/23/2014 0610   GFRNONAA 46* 11/29/2014 0657   GFRAA  53* 11/29/2014 0657     ASSESSMENT and THERAPY PLAN:  JAK 2 positive MPD  She is going to continue to hold her Hydrea and we will recheck laboratory studies in 2 weeks. We discussed different options for managing her disease. We may resort to phlebotomy. We will advise her of her laboratory studies at the two-week mark. If her hematocrit is significantly elevated we will discuss phlebotomy versus reinitiation of Hydrea. We will plan on formally seeing her back in the clinic in 6 weeks.   All questions were answered. The patient knows to call the clinic with any problems, questions or concerns. We can certainly see the patient much sooner if necessary. This note was electronically signed. Molli Hazard MD 12/04/2014

## 2014-11-09 NOTE — Progress Notes (Signed)
LABS DRAWN

## 2014-11-09 NOTE — Patient Instructions (Signed)
[place] [facility] Discharge Instructions  RECOMMENDATIONS MADE BY THE CONSULTANT AND ANY TEST RESULTS WILL BE SENT TO YOUR REFERRING PHYSICIAN.  Exam and discussion by Dr. Whitney Muse. Continue to hold the hydrea. Call with any issues or concerns.  Follow-up with labs only in 2 weeks Labs and office visit in 6 weeks.  Thank you for choosing [place] at [facility] to provide your oncology and hematology care.  To afford each patient quality time with our provider, please arrive at least 15 minutes before your scheduled appointment time.    You need to re-schedule your appointment should you arrive 10 or more minutes late.  We strive to give you quality time with our providers, and arriving late affects you and other patients whose appointments are after yours.  Also, if you no show three or more times for appointments you may be dismissed from the clinic at the providers discretion.     Again, thank you for choosing [facility].  Our hope is that these requests will decrease the amount of time that you wait before being seen by our physicians.       _____________________________________________________________  Should you have questions after your visit to [facility], please contact our office at [phone] between the hours of 8:30 a.m. and 4:30 p.m.  Voicemails left after 4:30 p.m. will not be returned until the following business day.  For prescription refill requests, have your pharmacy contact our office.

## 2014-11-10 DIAGNOSIS — E063 Autoimmune thyroiditis: Secondary | ICD-10-CM | POA: Diagnosis not present

## 2014-11-10 DIAGNOSIS — E782 Mixed hyperlipidemia: Secondary | ICD-10-CM | POA: Diagnosis not present

## 2014-11-10 DIAGNOSIS — I10 Essential (primary) hypertension: Secondary | ICD-10-CM | POA: Diagnosis not present

## 2014-11-10 DIAGNOSIS — Z6824 Body mass index (BMI) 24.0-24.9, adult: Secondary | ICD-10-CM | POA: Diagnosis not present

## 2014-11-22 ENCOUNTER — Emergency Department (HOSPITAL_COMMUNITY): Payer: Medicare Other

## 2014-11-22 ENCOUNTER — Inpatient Hospital Stay (HOSPITAL_COMMUNITY)
Admission: EM | Admit: 2014-11-22 | Discharge: 2014-11-29 | DRG: 291 | Disposition: A | Discharge status: Home / Self Care | Payer: Medicare Other | Attending: Internal Medicine | Admitting: Internal Medicine

## 2014-11-22 ENCOUNTER — Encounter (HOSPITAL_COMMUNITY): Payer: Self-pay | Admitting: *Deleted

## 2014-11-22 DIAGNOSIS — I10 Essential (primary) hypertension: Secondary | ICD-10-CM | POA: Diagnosis not present

## 2014-11-22 DIAGNOSIS — Z9861 Coronary angioplasty status: Secondary | ICD-10-CM

## 2014-11-22 DIAGNOSIS — Z90710 Acquired absence of both cervix and uterus: Secondary | ICD-10-CM | POA: Diagnosis not present

## 2014-11-22 DIAGNOSIS — H9190 Unspecified hearing loss, unspecified ear: Secondary | ICD-10-CM | POA: Diagnosis present

## 2014-11-22 DIAGNOSIS — I482 Chronic atrial fibrillation, unspecified: Secondary | ICD-10-CM | POA: Diagnosis present

## 2014-11-22 DIAGNOSIS — Z7982 Long term (current) use of aspirin: Secondary | ICD-10-CM

## 2014-11-22 DIAGNOSIS — G629 Polyneuropathy, unspecified: Secondary | ICD-10-CM | POA: Diagnosis present

## 2014-11-22 DIAGNOSIS — E039 Hypothyroidism, unspecified: Secondary | ICD-10-CM | POA: Diagnosis not present

## 2014-11-22 DIAGNOSIS — I739 Peripheral vascular disease, unspecified: Secondary | ICD-10-CM | POA: Diagnosis present

## 2014-11-22 DIAGNOSIS — D473 Essential (hemorrhagic) thrombocythemia: Secondary | ICD-10-CM | POA: Diagnosis present

## 2014-11-22 DIAGNOSIS — J90 Pleural effusion, not elsewhere classified: Secondary | ICD-10-CM | POA: Diagnosis not present

## 2014-11-22 DIAGNOSIS — Z7901 Long term (current) use of anticoagulants: Secondary | ICD-10-CM

## 2014-11-22 DIAGNOSIS — M1990 Unspecified osteoarthritis, unspecified site: Secondary | ICD-10-CM | POA: Diagnosis present

## 2014-11-22 DIAGNOSIS — J811 Chronic pulmonary edema: Secondary | ICD-10-CM | POA: Diagnosis present

## 2014-11-22 DIAGNOSIS — I5033 Acute on chronic diastolic (congestive) heart failure: Principal | ICD-10-CM | POA: Insufficient documentation

## 2014-11-22 DIAGNOSIS — I5031 Acute diastolic (congestive) heart failure: Secondary | ICD-10-CM | POA: Diagnosis not present

## 2014-11-22 DIAGNOSIS — J948 Other specified pleural conditions: Secondary | ICD-10-CM | POA: Diagnosis not present

## 2014-11-22 DIAGNOSIS — R0600 Dyspnea, unspecified: Secondary | ICD-10-CM

## 2014-11-22 DIAGNOSIS — I509 Heart failure, unspecified: Secondary | ICD-10-CM

## 2014-11-22 DIAGNOSIS — D75839 Thrombocytosis, unspecified: Secondary | ICD-10-CM | POA: Diagnosis present

## 2014-11-22 DIAGNOSIS — J9601 Acute respiratory failure with hypoxia: Secondary | ICD-10-CM | POA: Diagnosis not present

## 2014-11-22 DIAGNOSIS — R0602 Shortness of breath: Secondary | ICD-10-CM

## 2014-11-22 DIAGNOSIS — J810 Acute pulmonary edema: Secondary | ICD-10-CM

## 2014-11-22 DIAGNOSIS — E038 Other specified hypothyroidism: Secondary | ICD-10-CM | POA: Diagnosis not present

## 2014-11-22 DIAGNOSIS — D45 Polycythemia vera: Secondary | ICD-10-CM | POA: Diagnosis not present

## 2014-11-22 LAB — CBC WITH DIFFERENTIAL/PLATELET
BASOS PCT: 2 % — AB (ref 0–1)
Basophils Absolute: 0.1 10*3/uL (ref 0.0–0.1)
EOS ABS: 0 10*3/uL (ref 0.0–0.7)
EOS PCT: 1 % (ref 0–5)
HCT: 45.7 % (ref 36.0–46.0)
Hemoglobin: 14.4 g/dL (ref 12.0–15.0)
LYMPHS ABS: 0.8 10*3/uL (ref 0.7–4.0)
Lymphocytes Relative: 10 % — ABNORMAL LOW (ref 12–46)
MCH: 35.1 pg — ABNORMAL HIGH (ref 26.0–34.0)
MCHC: 31.5 g/dL (ref 30.0–36.0)
MCV: 111.5 fL — AB (ref 78.0–100.0)
Monocytes Absolute: 0.5 10*3/uL (ref 0.1–1.0)
Monocytes Relative: 7 % (ref 3–12)
NEUTROS ABS: 6.3 10*3/uL (ref 1.7–7.7)
Neutrophils Relative %: 81 % — ABNORMAL HIGH (ref 43–77)
PLATELETS: 779 10*3/uL — AB (ref 150–400)
RBC: 4.1 MIL/uL (ref 3.87–5.11)
RDW: 24.7 % — ABNORMAL HIGH (ref 11.5–15.5)
SMEAR REVIEW: INCREASED
WBC: 7.9 10*3/uL (ref 4.0–10.5)

## 2014-11-22 LAB — BASIC METABOLIC PANEL
Anion gap: 9 (ref 5–15)
BUN: 14 mg/dL (ref 6–23)
CO2: 23 mmol/L (ref 19–32)
CREATININE: 0.87 mg/dL (ref 0.50–1.10)
Calcium: 9.1 mg/dL (ref 8.4–10.5)
Chloride: 109 mmol/L (ref 96–112)
GFR, EST AFRICAN AMERICAN: 75 mL/min — AB (ref >90)
GFR, EST NON AFRICAN AMERICAN: 65 mL/min — AB (ref >90)
Glucose, Bld: 109 mg/dL — ABNORMAL HIGH (ref 70–99)
POTASSIUM: 4.2 mmol/L (ref 3.5–5.1)
Sodium: 141 mmol/L (ref 135–145)

## 2014-11-22 LAB — PROTIME-INR
INR: 2.47 — AB (ref 0.00–1.49)
PROTHROMBIN TIME: 27 s — AB (ref 11.6–15.2)

## 2014-11-22 LAB — TROPONIN I

## 2014-11-22 LAB — BRAIN NATRIURETIC PEPTIDE: B NATRIURETIC PEPTIDE 5: 456 pg/mL — AB (ref 0.0–100.0)

## 2014-11-22 LAB — TSH: TSH: 1.984 u[IU]/mL (ref 0.350–4.500)

## 2014-11-22 MED ORDER — ONDANSETRON HCL 4 MG/2ML IJ SOLN: 4.0000 mg | Freq: Four times a day (QID) | INTRAMUSCULAR | Status: DC | PRN

## 2014-11-22 MED ORDER — IPRATROPIUM-ALBUTEROL 0.5-2.5 (3) MG/3ML IN SOLN
3.0000 mL | RESPIRATORY_TRACT | Status: DC
  Administered 2014-11-22 – 2014-11-23 (×3): 3 mL via RESPIRATORY_TRACT
  Filled 2014-11-22 (×3): qty 3

## 2014-11-22 MED ORDER — DILTIAZEM HCL ER COATED BEADS 240 MG PO CP24
240.0000 mg | ORAL_CAPSULE | Freq: Every day | ORAL | Status: DC
  Administered 2014-11-23: 240 mg via ORAL
  Filled 2014-11-22: qty 1

## 2014-11-22 MED ORDER — ONDANSETRON HCL 4 MG PO TABS: 4.0000 mg | ORAL_TABLET | Freq: Four times a day (QID) | ORAL | Status: DC | PRN

## 2014-11-22 MED ORDER — WARFARIN SODIUM 5 MG PO TABS
2.5000 mg | ORAL_TABLET | Freq: Once | ORAL | Status: AC
  Administered 2014-11-22: 2.5 mg via ORAL
  Filled 2014-11-22: qty 1

## 2014-11-22 MED ORDER — HYDRALAZINE HCL 20 MG/ML IJ SOLN: 5.0000 mg | INTRAMUSCULAR | Status: DC | PRN

## 2014-11-22 MED ORDER — ACETAMINOPHEN 650 MG RE SUPP: 650.0000 mg | Freq: Four times a day (QID) | RECTAL | Status: DC | PRN

## 2014-11-22 MED ORDER — ASPIRIN EC 81 MG PO TBEC
81.0000 mg | DELAYED_RELEASE_TABLET | Freq: Every day | ORAL | Status: DC
Start: 2014-11-22 — End: 2014-11-29
  Administered 2014-11-22 – 2014-11-29 (×8): 81 mg via ORAL
  Filled 2014-11-22 (×8): qty 1

## 2014-11-22 MED ORDER — ALUM & MAG HYDROXIDE-SIMETH 200-200-20 MG/5ML PO SUSP: 30.0000 mL | Freq: Four times a day (QID) | ORAL | Status: DC | PRN

## 2014-11-22 MED ORDER — SODIUM CHLORIDE 0.9 % IJ SOLN
3.0000 mL | INTRAMUSCULAR | Status: DC | PRN
  Administered 2014-11-22: 3 mL via INTRAVENOUS
  Filled 2014-11-22: qty 3

## 2014-11-22 MED ORDER — SODIUM CHLORIDE 0.9 % IV SOLN: 250.0000 mL | INTRAVENOUS | Status: DC | PRN

## 2014-11-22 MED ORDER — ACETAMINOPHEN 325 MG PO TABS: 650.0000 mg | ORAL_TABLET | Freq: Four times a day (QID) | ORAL | Status: DC | PRN

## 2014-11-22 MED ORDER — HYDROCODONE-ACETAMINOPHEN 5-325 MG PO TABS: 1.0000 | ORAL_TABLET | ORAL | Status: DC | PRN

## 2014-11-22 MED ORDER — TRAZODONE HCL 50 MG PO TABS: 25.0000 mg | ORAL_TABLET | Freq: Every evening | ORAL | Status: DC | PRN

## 2014-11-22 MED ORDER — SODIUM CHLORIDE 0.9 % IJ SOLN
3.0000 mL | Freq: Two times a day (BID) | INTRAMUSCULAR | Status: DC
  Administered 2014-11-22 – 2014-11-27 (×5): 3 mL via INTRAVENOUS

## 2014-11-22 MED ORDER — LEVOTHYROXINE SODIUM 75 MCG PO TABS
75.0000 ug | ORAL_TABLET | Freq: Every day | ORAL | Status: DC
  Administered 2014-11-23 – 2014-11-29 (×7): 75 ug via ORAL
  Filled 2014-11-22 (×7): qty 1

## 2014-11-22 MED ORDER — METOPROLOL SUCCINATE ER 50 MG PO TB24
100.0000 mg | ORAL_TABLET | Freq: Two times a day (BID) | ORAL | Status: DC
  Administered 2014-11-22: 100 mg via ORAL
  Filled 2014-11-22 (×2): qty 2

## 2014-11-22 MED ORDER — FUROSEMIDE 10 MG/ML IJ SOLN
40.0000 mg | Freq: Once | INTRAMUSCULAR | Status: AC
  Administered 2014-11-22: 40 mg via INTRAVENOUS
  Filled 2014-11-22: qty 4

## 2014-11-22 MED ORDER — WARFARIN - PHARMACIST DOSING INPATIENT: Status: DC

## 2014-11-22 MED ORDER — GABAPENTIN 100 MG PO CAPS
100.0000 mg | ORAL_CAPSULE | Freq: Three times a day (TID) | ORAL | Status: DC
  Administered 2014-11-22 – 2014-11-29 (×22): 100 mg via ORAL
  Filled 2014-11-22 (×23): qty 1

## 2014-11-22 MED ORDER — FUROSEMIDE 10 MG/ML IJ SOLN
40.0000 mg | Freq: Two times a day (BID) | INTRAMUSCULAR | Status: DC
  Administered 2014-11-22 – 2014-11-23 (×2): 40 mg via INTRAVENOUS
  Filled 2014-11-22 (×2): qty 4

## 2014-11-22 MED ORDER — POLYETHYLENE GLYCOL 3350 17 G PO PACK: 17.0000 g | PACK | Freq: Every day | ORAL | Status: DC | PRN

## 2014-11-22 MED ORDER — SODIUM CHLORIDE 0.9 % IJ SOLN
3.0000 mL | Freq: Two times a day (BID) | INTRAMUSCULAR | Status: DC
  Administered 2014-11-22 – 2014-11-29 (×11): 3 mL via INTRAVENOUS

## 2014-11-22 NOTE — Progress Notes (Signed)
[place] for Warfarin Indication: atrial fibrillation, Hx DVT  No Known Allergies  Patient Measurements: Height: 6' (182.9 cm) Weight: 179 lb 3.7 oz (81.3 kg) IBW/kg (Calculated) : 73.1  Vital Signs: Temp: 97.5 F (36.4 C) (04/20 0940) Temp Source: Oral (04/20 0940) BP: 152/96 mmHg (04/20 1058) Pulse Rate: 91 (04/20 0940)  Labs:  Recent Labs  11/22/14 0631  HGB 14.4  HCT 45.7  PLT 779*  LABPROT 27.0*  INR 2.47*  CREATININE 0.87  TROPONINI <0.03    Estimated Creatinine Clearance: 67.5 mL/min (by C-G formula based on Cr of 0.87).   Medical History: Past Medical History  Diagnosis Date  . Hypertension   . Arthritis   . Varicose veins   . Deaf   . Elevated WBC count     and platelets  . Thyroid disease   . Irregular heart rate   . 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 Peripheral neuropathy     feet  . 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 Atrial fibrillation, chronic   . 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 Peripheral vascular disease   . Ulcer of ankle 05/2014    left and right ankle   Medications:  Prescriptions prior to admission  Medication Sig Dispense Refill Last Dose  . aspirin EC 81 MG tablet Take 81 mg by mouth daily.   11/21/2014 at 0500  . Cholecalciferol (VITAMIN D3) 5000 UNITS CAPS Take 1 capsule by mouth daily.   11/21/2014 at 1500  . diltiazem (CARDIZEM CD) 240 MG 24 hr capsule Take 1 capsule (240 mg total) by mouth daily. 90 capsule 3 11/22/2014 at 0500  . gabapentin (NEURONTIN) 100 MG capsule Take 100 mg by mouth 3 (three) times daily.     11/21/2014 at 2000  . levothyroxine (SYNTHROID, LEVOTHROID) 75 MCG tablet    11/21/2014 at 0500  . metoprolol (TOPROL-XL) 100 MG 24 hr tablet Take 100 mg by mouth 2 (two) times daily.     11/22/2014 at 0500  . warfarin (COUMADIN) 5 MG tablet Take 1 tablet daily except 1/2 tablet on Mondays, Wednesdays and Fridays (Patient taking differently: Take 2.5-5 mg by mouth See  admin instructions. Take 1 tablet daily except 1/2 tablet on Mondays, Wednesdays, Fridays and Saturdays.) 30 tablet 6 11/21/2014 at 1800    Assessment: Okay for Protocol, INR @ goal.  Last dose 4/19.  Goal of Therapy:  INR 2-3   Plan:  Warfarin 2.5mg  PO today. Daily PT/INR.  Biagio Quint R 11/22/2014,11:38 AM

## 2014-11-22 NOTE — Progress Notes (Signed)
Patient BP 152/96. Scheduled Metoprolol was for 1100. Pt refused and stated she had medication this AM before coming to the ER. Made PA aware of BP and states she would put in a PRN order for High BP.

## 2014-11-22 NOTE — Progress Notes (Signed)
  Echocardiogram 2D Echocardiogram has been performed.  Charlotte [name] 11/22/2014, 4:48 PM

## 2014-11-22 NOTE — ED Notes (Signed)
Pt reporting SOB worse last night.  Pt reports increased SOB when laying flat.  Denies cough or any CP.

## 2014-11-22 NOTE — Consult Note (Signed)
Reason for Consult: CHF Referring Physician: [facility] Cardiologist Dr.Koneswaran   Charlotte [name] is an 74 y.o. female.  HPI: This is a 74 year old female patient of Dr.Koneswaran with history of chronic atrial fibrillation treated with metoprolol and diltiazem and warfarin. She also has hypertension, peripheral vascular disease status post right femoropopliteal bypass and endarterectomy. She also has polycythemia vera. She had a low risk Lexi scan Myoview January 2015 and 2-D echo in 2013 revealing normal LV function EF 55-60% with mild MR. She last saw Dr. Bronson Ing  03/2014 at which time she was doing well. She was referred to pulmonary for reduced DLCO but she had not gone.  Patient was on Hydrea for polycythemia vera until one month ago when she developed severe weakness and it was stopped. She has follow-up tomorrow. She began to feel better but over the past couple weeks has had progressive dyspnea on exertion which progressed to dyspnea at rest and orthopnea. She denied any edema or weight gain. She tries to watch her salt but does eat out at a steak house fairly often. She denies any chest pain, palpitations, dizziness or presyncope. BNP elevated, troponins negative  Past Medical History  Diagnosis Date  . Hypertension   . Arthritis   . Varicose veins   . Deaf   . Elevated WBC count     and platelets  . Thyroid disease   . Irregular heart rate   . 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 Peripheral neuropathy     feet  . 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 Atrial fibrillation, chronic   . 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 Peripheral vascular disease   . Ulcer of ankle 05/2014    left and right ankle    Past Surgical History  Procedure Laterality Date  . Blt    . Abdominal hysterectomy    . Coronary angioplasty    . Ct of abd    . Hip pinning,cannulated  11/19/2011    Procedure: CANNULATED HIP PINNING;  Surgeon: Sanjuana Kava, MD;  Location: [facility];  Service:  Orthopedics;  Laterality: Right;  . Fasciotomy  11/29/2011    Procedure: FASCIOTOMY;  Surgeon: Carole Civil, MD;  Location: [facility];  Service: Orthopedics;  Laterality: Right;  right thigh   . Dressing change under anesthesia  12/02/2011    Procedure: DRESSING CHANGE UNDER ANESTHESIA;  Surgeon: Carole Civil, MD;  Location: [facility];  Service: Orthopedics;  Laterality: Right;  . Endarterectomy femoral Right 09/15/2012    Procedure: ENDARTERECTOMY FEMORAL;  Surgeon: Mal Misty, MD;  Location: [place];  Service: Vascular;  Laterality: Right;  . Femoral-popliteal bypass graft Right 09/15/2012    Procedure: BYPASS GRAFT FEMORAL-POPLITEAL ARTERY;  Surgeon: Mal Misty, MD;  Location: [place];  Service: Vascular;  Laterality: Right;  . Patch angioplasty Right 09/15/2012    Procedure: PATCH ANGIOPLASTY;  Surgeon: Mal Misty, MD;  Location: [place];  Service: Vascular;  Laterality: Right;  . Abdominal aortagram N/A 09/14/2012    Procedure: ABDOMINAL [name];  Surgeon: Serafina Mitchell, MD;  Location: [facility] CATH LAB;  Service: Cardiovascular;  Laterality: N/A;    Family History  Problem Relation Age of Onset  . Diabetes Son     Social History:  reports that she has never smoked. She has never used smokeless tobacco. She reports that she does not drink alcohol or use illicit drugs.  Allergies: No Known Allergies  Medications: Scheduled Meds: . aspirin EC  81 mg Oral Daily  .  diltiazem  240 mg Oral Daily  . furosemide  40 mg Intravenous Q12H  . gabapentin  100 mg Oral TID  . [START ON 11/23/2014] levothyroxine  75 mcg Oral QAC breakfast  . metoprolol succinate  100 mg Oral BID  . sodium chloride  3 mL Intravenous Q12H  . sodium chloride  3 mL Intravenous Q12H  . warfarin  2.5 mg Oral Once  . [START ON 11/23/2014] Warfarin - Pharmacist Dosing Inpatient   Does not apply Q24H   Continuous Infusions:  PRN Meds:.sodium chloride, acetaminophen **OR** acetaminophen, alum & mag  hydroxide-simeth, hydrALAZINE, HYDROcodone-acetaminophen, ondansetron **OR** ondansetron (ZOFRAN) IV, polyethylene glycol, sodium chloride, traZODone   Results for orders placed or performed during the hospital encounter of 11/22/14 (from the past 48 hour(s))  TSH     Status: None   Collection Time: 11/22/14  6:26 AM  Result Value Ref Range   TSH 1.984 0.350 - 4.500 uIU/mL  CBC with Differential     Status: Abnormal   Collection Time: 11/22/14  6:31 AM  Result Value Ref Range   WBC 7.9 4.0 - 10.5 K/uL   RBC 4.10 3.87 - 5.11 MIL/uL   Hemoglobin 14.4 12.0 - 15.0 g/dL   HCT 45.7 36.0 - 46.0 %   MCV 111.5 (H) 78.0 - 100.0 fL   MCH 35.1 (H) 26.0 - 34.0 pg   MCHC 31.5 30.0 - 36.0 g/dL   RDW 24.7 (H) 11.5 - 15.5 %   Platelets 779 (H) 150 - 400 K/uL   Neutrophils Relative % 81 (H) 43 - 77 %   Neutro Abs 6.3 1.7 - 7.7 K/uL   Lymphocytes Relative 10 (L) 12 - 46 %   Lymphs Abs 0.8 0.7 - 4.0 K/uL   Monocytes Relative 7 3 - 12 %   Monocytes Absolute 0.5 0.1 - 1.0 K/uL   Eosinophils Relative 1 0 - 5 %   Eosinophils Absolute 0.0 0.0 - 0.7 K/uL   Basophils Relative 2 (H) 0 - 1 %   Basophils Absolute 0.1 0.0 - 0.1 K/uL   Smear Review PLATELETS APPEAR INCREASED   Basic metabolic panel     Status: Abnormal   Collection Time: 11/22/14  6:31 AM  Result Value Ref Range   Sodium 141 135 - 145 mmol/L   Potassium 4.2 3.5 - 5.1 mmol/L   Chloride 109 96 - 112 mmol/L   CO2 23 19 - 32 mmol/L   Glucose, Bld 109 (H) 70 - 99 mg/dL   BUN 14 6 - 23 mg/dL   Creatinine, Ser 0.87 0.50 - 1.10 mg/dL   Calcium 9.1 8.4 - 10.5 mg/dL   GFR calc non Af Amer 65 (L) >90 mL/min   GFR calc Af Amer 75 (L) >90 mL/min    Comment: (NOTE) The eGFR has been calculated using the CKD EPI equation. This calculation has not been validated in all clinical situations. eGFR's persistently <90 mL/min signify possible Chronic Kidney Disease.    Anion gap 9 5 - 15  Brain natriuretic peptide     Status: Abnormal   Collection  Time: 11/22/14  6:31 AM  Result Value Ref Range   B Natriuretic Peptide 456.0 (H) 0.0 - 100.0 pg/mL  Troponin I     Status: None   Collection Time: 11/22/14  6:31 AM  Result Value Ref Range   Troponin I <0.03 <0.031 ng/mL    Comment:        NO INDICATION OF MYOCARDIAL INJURY.   Protime-INR  Status: Abnormal   Collection Time: 11/22/14  6:31 AM  Result Value Ref Range   Prothrombin Time 27.0 (H) 11.6 - 15.2 seconds   INR 2.47 (H) 0.00 - 1.49    Dg Chest Portable 1 View  11/22/2014   CLINICAL DATA:  Shortness of breath this morning. History of atrial fibrillation, polycythemia vera.  EXAM: PORTABLE CHEST - 1 VIEW  COMPARISON:  Chest radiograph October 14, 2013  FINDINGS: The cardiac silhouette is appears mild to moderately enlarged, mediastinal silhouette is nonsuspicious. Pulmonary vascular congestion, patchy LEFT greater than RIGHT lower lobe consolidation with small to moderate LEFT, small RIGHT pleural effusions. No pneumothorax. Mild osteopenia. Soft tissue planes are nonsuspicious.  IMPRESSION: Mild-to-moderate cardiomegaly. Pulmonary vascular congestion. LEFT greater than RIGHT alveolar airspace opacities and small pleural effusions concerning for pulmonary edema, though superimposed pneumonia cannot be excluded. Recommend followup chest radiograph after treatment to verify improvement.   Electronically Signed   By: Elon Alas   On: 11/22/2014 06:16    ROS  See HPI Eyes: Negative Ears:Negative for hearing loss, tinnitus Cardiovascular: Negative for chest pain, palpitations,irregular heartbeat,  near-syncope,  paroxysmal nocturnal dyspnea and syncope,edema, claudication, cyanosis,.  Respiratory:   Negative for cough, hemoptysis, sleep disturbances due to breathing, sputum production and wheezing.   Endocrine: Negative for cold intolerance and heat intolerance.  Hematologic/Lymphatic: Negative for adenopathy and bleeding problem. Does not bruise/bleed easily.   Musculoskeletal: Negative.   Gastrointestinal: Negative for nausea, vomiting, reflux, abdominal pain, diarrhea, constipation.   Genitourinary: Negative for bladder incontinence, dysuria, flank pain, frequency, hematuria, hesitancy, nocturia and urgency.  Neurological: Negative.  Allergic/Immunologic: Negative for environmental allergies.  Blood pressure 152/96, pulse 91, temperature 97.5 F (36.4 C), temperature source Oral, resp. rate 18, height 6' (1.829 m), weight 179 lb 3.7 oz (81.3 kg), SpO2 90 %. Physical Exam PHYSICAL EXAM: Well-nournished, in no acute distress. Neck: No JVD, HJR, Bruit, or thyroid enlargement Lungs: Decreased breath sounds with few crackles at the bases  Cardiovascular: Irregular irregular, PMI not displaced, 2/6 systolic murmur at the left sternal border, no  gallops, bruit, thrill, or heave. Abdomen: BS normal. Soft without organomegaly, masses, lesions or tenderness. Extremities: without cyanosis, clubbing or edema. Good distal pulses bilateral SKin: Warm, no lesions or rashes  Musculoskeletal: No deformities Neuro: no focal signs  IMPRESSION: Low risk Lexiscan Cardiolite. There were no diagnostic ST segment abnormalities, atrial fibrillation persistent throughout. Perfusion imaging is consistent with breast attenuation affecting the anteroseptal wall toward the apex, no clear evidence of scar or ischemia. LV volumes are normal with normal LVEF of 61%.     Electronically Signed   By: Rozann Lesches M.D.   On: 08/17/2013 15:21    2-D echo 2013 Study Conclusions  - Left ventricle: The cavity size was normal. There was   severe concentric hypertrophy. Systolic function was   normal. The estimated ejection fraction was in the range   of 55% to 60%. Wall motion was normal; there were no   regional wall motion abnormalities. The study is not   technically sufficient to allow evaluation of LV diastolic   function. - Mitral valve: Calcified annulus.  Mild regurgitation. - Left atrium: Moderately dilated left atrium. - Right atrium: The atrium was mildly dilated. - Inferior vena cava: The vessel was normal in size; the   respirophasic diameter changes were in the normal range (=   50%); findings are consistent with normal central venous   pressure. - Pericardium, extracardiac: ? ascites There was a  left   pleural effusion.  EKG atrial fibrillation with controlled ventricular rate, no acute change   Assessment/Plan: Acute  CHF patients had a normal EF in the past. Repeat 2-D echo pending. Continue diuresis on IV Lasix 40 mg twice a day  Chronic atrial fibrillation with controlled ventricular rate on diltiazem, Toprol and Coumadin  Polycythemia vera recently Hydrea was stopped because of side effects  Hypertension blood pressure elevated  Peripheral vascular disease status post femoropopliteal bypass    Ermalinda Barrios 11/22/2014, 12:49 PM   Patient seen and discussed with [facility], I agree with her documentation. 74 yo female hx of afib, polycythemia vera, HTN, PAD, HTN admitted with SOB and orthopnea.    TSH 1.98, BNP 456, K 4.2, Cr 0.87, Hgb 14.4, Plt 779, trop neg x 1 CXR cardiomegaly, pulm edema, bilateral pleural effusions 12/2011 echo LVEF 55-60%, severe LVH, cannot eval diastolic function, mild MR EKG afib, RAD   Presentation consistent acute heart failure, echo is pending to further evaluate CHF etiology. Continue IV lasix, will monitor I/Os and renal function. Continue beta blocker and CCB for afib rate control, continue coumadin for stroke prevention.    Zandra Abts MD

## 2014-11-22 NOTE — ED Provider Notes (Signed)
CSN: [id number]     Arrival date & time 11/22/14  0542 History   First MD Initiated Contact with Patient 11/22/14 0550     No chief complaint on file.    (Consider location/radiation/quality/duration/timing/severity/associated sxs/prior Treatment) HPI  This is a 74 year old female with a history of hypertension, polycythemia vera, atrial fibrillation on Coumadin who presents with shortness of breath. Patient reports increasing shortness of breath over the last 24 hours. Reports recent history of shortness of breath. Has seen her primary physician for this. She states that her shortness of breath is worse when lying flat. Denies any fever, cough, or chest pain. Denies any lower extremity swelling. Does not use home oxygen.  No history of COPD and never smoked.  Past Medical History  Diagnosis Date  . Hypertension   . Arthritis   . Varicose veins   . Deaf   . Elevated WBC count     and platelets  . Thyroid disease   . Irregular heart rate   . 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 Peripheral neuropathy     feet  . 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 Atrial fibrillation, chronic   . 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 Peripheral vascular disease   . Ulcer of ankle 05/2014    left and right ankle   Past Surgical History  Procedure Laterality Date  . Blt    . Abdominal hysterectomy    . Coronary angioplasty    . Ct of abd    . Hip pinning,cannulated  11/19/2011    Procedure: CANNULATED HIP PINNING;  Surgeon: Sanjuana Kava, MD;  Location: [facility];  Service: Orthopedics;  Laterality: Right;  . Fasciotomy  11/29/2011    Procedure: FASCIOTOMY;  Surgeon: Carole Civil, MD;  Location: [facility];  Service: Orthopedics;  Laterality: Right;  right thigh   . Dressing change under anesthesia  12/02/2011    Procedure: DRESSING CHANGE UNDER ANESTHESIA;  Surgeon: Carole Civil, MD;  Location: [facility];  Service: Orthopedics;  Laterality: Right;  . Endarterectomy femoral Right 09/15/2012     Procedure: ENDARTERECTOMY FEMORAL;  Surgeon: Mal Misty, MD;  Location: [place];  Service: Vascular;  Laterality: Right;  . Femoral-popliteal bypass graft Right 09/15/2012    Procedure: BYPASS GRAFT FEMORAL-POPLITEAL ARTERY;  Surgeon: Mal Misty, MD;  Location: [place];  Service: Vascular;  Laterality: Right;  . Patch angioplasty Right 09/15/2012    Procedure: PATCH ANGIOPLASTY;  Surgeon: Mal Misty, MD;  Location: [place];  Service: Vascular;  Laterality: Right;  . Abdominal aortagram N/A 09/14/2012    Procedure: ABDOMINAL [name];  Surgeon: Serafina Mitchell, MD;  Location: [facility] CATH LAB;  Service: Cardiovascular;  Laterality: N/A;   Family History  Problem Relation Age of Onset  . Diabetes Son    History  Substance Use Topics  . Smoking status: Never Smoker   . Smokeless tobacco: Never Used  . Alcohol Use: No   OB History    No data available     Review of Systems  Constitutional: Negative for fever and chills.  Respiratory: Positive for cough and shortness of breath. Negative for chest tightness.   Cardiovascular: Negative for chest pain and leg swelling.  Gastrointestinal: Negative for nausea, vomiting and abdominal pain.  Genitourinary: Negative for dysuria.  Musculoskeletal: Negative for back pain.  Neurological: Negative for headaches.  Psychiatric/Behavioral: Negative for confusion.  All other systems reviewed and are negative.     Allergies  Review of patient's allergies indicates  no known allergies.  Home Medications   Prior to Admission medications   Medication Sig Start Date End Date Taking? Authorizing Provider  Acetaminophen (TYLENOL EXTRA STRENGTH PO) Take 1 tablet by mouth as needed.    Historical Provider, MD  aspirin EC 81 MG tablet Take 81 mg by mouth daily.    Historical Provider, MD  diltiazem (CARDIZEM CD) 240 MG 24 hr capsule Take 1 capsule (240 mg total) by mouth daily. 09/18/14   Herminio Commons, MD  gabapentin (NEURONTIN) 100 MG capsule  Take 100 mg by mouth 3 (three) times daily.      Historical Provider, MD  hydroxyurea (HYDREA) 500 MG capsule Take 2 capsules (1,000 mg total) by mouth 2 (two) times daily. Patient not taking: Reported on 11/09/2014 07/14/14   Farrel Gobble, MD  levothyroxine (SYNTHROID, LEVOTHROID) 75 MCG tablet  10/16/14   Historical Provider, MD  metoprolol (TOPROL-XL) 100 MG 24 hr tablet Take 100 mg by mouth 2 (two) times daily.      Historical Provider, MD  warfarin (COUMADIN) 5 MG tablet Take 1 tablet daily except 1/2 tablet on Mondays, Wednesdays and Fridays 08/08/14   Herminio Commons, MD   BP 180/96 mmHg  Pulse 88  Temp(Src) 98.5 F (36.9 C) (Oral)  Resp 18  Ht 6' (1.829 m)  Wt 178 lb (80.74 kg)  BMI 24.14 kg/m2  SpO2 90% Physical Exam  Constitutional: She is oriented to person, place, and time. No distress.  Elderly  HENT:  Head: Normocephalic and atraumatic.  Cardiovascular: Normal rate and normal heart sounds.   Irregular rhythm  Pulmonary/Chest: Effort normal. No respiratory distress. She has no wheezes.  Crackles left lower lobe, O2 in place, mildly tachypneic  Abdominal: Soft. Bowel sounds are normal. There is no tenderness. There is no rebound.  Musculoskeletal:  Trace symmetric bilateral lower extremity edema  Neurological: She is alert and oriented to person, place, and time.  Skin: Skin is warm and dry.  Psychiatric: She has a normal mood and affect.  Nursing note and vitals reviewed.   ED Course  Procedures (including critical care time) Labs Review Labs Reviewed  CBC WITH DIFFERENTIAL/PLATELET - Abnormal; Notable for the following:    MCV 111.5 (*)    MCH 35.1 (*)    RDW 24.7 (*)    Platelets 779 (*)    Neutrophils Relative % 81 (*)    Lymphocytes Relative 10 (*)    Basophils Relative 2 (*)    All other components within normal limits  BASIC METABOLIC PANEL - Abnormal; Notable for the following:    Glucose, Bld 109 (*)    GFR calc non Af Amer 65 (*)    GFR calc  Af Amer 75 (*)    All other components within normal limits  BRAIN NATRIURETIC PEPTIDE - Abnormal; Notable for the following:    B Natriuretic Peptide 456.0 (*)    All other components within normal limits  PROTIME-INR - Abnormal; Notable for the following:    Prothrombin Time 27.0 (*)    INR 2.47 (*)    All other components within normal limits  TROPONIN I    Imaging Review Dg Chest Portable 1 View  11/22/2014   CLINICAL DATA:  Shortness of breath this morning. History of atrial fibrillation, polycythemia vera.  EXAM: PORTABLE CHEST - 1 VIEW  COMPARISON:  Chest radiograph October 14, 2013  FINDINGS: The cardiac silhouette is appears mild to moderately enlarged, mediastinal silhouette is nonsuspicious. Pulmonary vascular congestion, patchy  LEFT greater than RIGHT lower lobe consolidation with small to moderate LEFT, small RIGHT pleural effusions. No pneumothorax. Mild osteopenia. Soft tissue planes are nonsuspicious.  IMPRESSION: Mild-to-moderate cardiomegaly. Pulmonary vascular congestion. LEFT greater than RIGHT alveolar airspace opacities and small pleural effusions concerning for pulmonary edema, though superimposed pneumonia cannot be excluded. Recommend followup chest radiograph after treatment to verify improvement.   Electronically Signed   By: Elon Alas   On: 11/22/2014 06:16     EKG Interpretation   Date/Time:  Wednesday November 22 2014 06:00:36 EDT Ventricular Rate:  92 PR Interval:    QRS Duration: 83 QT Interval:  435 QTC Calculation: 538 R Axis:   102 Text Interpretation:  Atrial fibrillation Right axis deviation Borderline  low voltage, extremity leads Prolonged QT interval Baseline wander in  lead(s) V1 V2 Confirmed by HORTON  MD, COURTNEY ([id number]) on 11/22/2014  6:23:41 AM      MDM   Final diagnoses:  Shortness of breath  Pleural effusion  Acute pulmonary edema    History presents with shortness of breath. Mild tachypnea noted. Patient satting 80% on room  air. Not on home oxygen. Patient placed on 2 L nasal cannula. Crackles at the lung bases. No overt signs of volume overload. No history of heart failure. Denies infectious symptoms including fever. Does endorse orthopnea. Echo from 2013 with normal EF. Basic labwork obtained. BNP 456. Chest x-ray shows mild to moderate cardiomegaly with pulmonary vascular congestion as well as bilateral pleural effusions concerning for pulmonary edema. Pneumonia cannot be ruled out. No evidence of leukocytosis in patient denies other infectious symptoms. Will defer antibiotics at this time. Patient given IV Lasix. Given oxygen requirement and new onset pulmonary edema, will admit for further workup and diuresis.    Merryl Hacker, MD 11/22/14 [phone]

## 2014-11-22 NOTE — H&P (Signed)
[facility] History and Physical  ASHLEYLYNN BEZANSON [id number] DOB: Aug 05, 1940 DOA: 11/22/2014  Referring physician: Dina Rich PCP: [name], MD   Chief Complaint: Shortness of breath  HPI: MATTALYNN BAUMEL is a Vertell Limber very pleasant 74 y.o. female with a past medical history that includes A. fib on Coumadin, hypertension, PVD, [place]. Demeo vera, peripheral vascular disease, DVT presents to the emergency department with chief complaint of shortness of breath. Initial evaluation reveals pulmonary edema and small pleural effusion per chest x-ray and an elevated BNP of 456. Patient reports she has been developing gradual worsening shortness of breath for the last 2 days. Last night however she was unable to sleep or lie flat. She denies chest pain palpitations diaphoresis nausea vomiting headache visual disturbances numbness or tingling of extremities. She denies lower extremity edema dysuria hematuria frequency or urgency. She denies fever chills or recent sick contacts. Reports compliance with all of her medications and physician visits. Workup in the emergency department includes basic metabolic panel that is unremarkable, complete blood count significant for platelets of 779. Initial troponin is negative, BNP 456, INR 2.47, chest x-ray as noted above, EKG with A. Fib. She is afebrile blood pressure trending towards the higher end of normal heart rate 87 she is not hypoxic. In the emergency department she is provided with Lasix 40 mg IV at the time of my exam respiratory status much improved.  Review of Systems:  10 point review of systems completed and all systems are negative except as indicated in the history of present illness  Past Medical History  Diagnosis Date  . Hypertension   . Arthritis   . Varicose veins   . Deaf   . Elevated WBC count     and platelets  . Thyroid disease   . Irregular heart rate   . 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 Peripheral neuropathy     feet  . 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 Atrial fibrillation, chronic   . 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 Peripheral vascular disease   . Ulcer of ankle 05/2014    left and right ankle   Past Surgical History  Procedure Laterality Date  . Blt    . Abdominal hysterectomy    . Coronary angioplasty    . Ct of abd    . Hip pinning,cannulated  11/19/2011    Procedure: CANNULATED HIP PINNING;  Surgeon: Sanjuana Kava, MD;  Location: [facility];  Service: Orthopedics;  Laterality: Right;  . Fasciotomy  11/29/2011    Procedure: FASCIOTOMY;  Surgeon: Carole Civil, MD;  Location: [facility];  Service: Orthopedics;  Laterality: Right;  right thigh   . Dressing change under anesthesia  12/02/2011    Procedure: DRESSING CHANGE UNDER ANESTHESIA;  Surgeon: Carole Civil, MD;  Location: [facility];  Service: Orthopedics;  Laterality: Right;  . Endarterectomy femoral Right 09/15/2012    Procedure: ENDARTERECTOMY FEMORAL;  Surgeon: Mal Misty, MD;  Location: [place];  Service: Vascular;  Laterality: Right;  . Femoral-popliteal bypass graft Right 09/15/2012    Procedure: BYPASS GRAFT FEMORAL-POPLITEAL ARTERY;  Surgeon: Mal Misty, MD;  Location: [place];  Service: Vascular;  Laterality: Right;  . Patch angioplasty Right 09/15/2012    Procedure: PATCH ANGIOPLASTY;  Surgeon: Mal Misty, MD;  Location: [place];  Service: Vascular;  Laterality: Right;  . Abdominal aortagram N/A 09/14/2012    Procedure: ABDOMINAL [name];  Surgeon: Serafina Mitchell, MD;  Location: [facility] C[facility] LAB;  Service:  Cardiovascular;  Laterality: N/A;   Social History:  reports that she has never smoked. She has never used smokeless tobacco. She reports that she does not drink alcohol or use illicit drugs. 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  Diabetes Son    mother deceased in her 15s medical history of CAD and multiple MIs, father deceased in his [id number] from complications of emphysema she has 3 siblings their collective medical history is positive for stroke  Prior to Admission medications   Medication Sig Start Date End Date Taking? Authorizing Provider  aspirin EC 81 MG tablet Take 81 mg by mouth daily.   Yes Historical Provider, MD  Cholecalciferol (VITAMIN D3) 5000 UNITS CAPS Take 1 capsule by mouth daily.   Yes Historical Provider, MD  diltiazem (CARDIZEM CD) 240 MG 24 hr capsule Take 1 capsule (240 mg total) by mouth daily. 09/18/14  Yes Herminio Commons, MD  gabapentin (NEURONTIN) 100 MG capsule Take 100 mg by mouth 3 (three) times daily.     Yes Historical Provider, MD  levothyroxine (SYNTHROID, LEVOTHROID) 75 MCG tablet  10/16/14  Yes Historical Provider, MD  metoprolol (TOPROL-XL) 100 MG 24 hr tablet Take 100 mg by mouth 2 (two) times daily.     Yes Historical Provider, MD  warfarin (COUMADIN) 5 MG tablet Take 1 tablet daily except 1/2 tablet on Mondays, Wednesdays and Fridays 08/08/14  Yes Herminio Commons, MD  hydroxyurea (HYDREA) 500 MG capsule Take 2 capsules (1,000 mg total) by mouth 2 (two) times daily. Patient not taking: Reported on 11/09/2014 07/14/14   Farrel Gobble, MD   Physical Exam: Filed Vitals:   11/22/14 0557 11/22/14 0700 11/22/14 0800 11/22/14 0830  BP: 180/96 128/80 149/92 154/95  Pulse: 88 77 85 98  Temp: 98.5 F (36.9 C)     TempSrc: Oral     Resp: 18 16 14 13   Height: 6' (1.829 m)     Weight: 80.74 kg (178 lb)     SpO2: 90% 92% 91% 92%    Wt Readings from Last 3 Encounters:  11/22/14 80.74 kg (178 lb)  11/09/14 81.103 kg (178 lb 12.8 oz)  10/09/14 80.377 kg (177 lb 3.2 oz)    General:  Appears calm and comfortable Eyes: PERRL, normal lids, irises & conjunctiva ENT: grossly normal hearing, lips & tongue, he does membranes of her mouth are moist and pink Neck: no LAD, masses or  thyromegaly Cardiovascular: Irregularly irregular I hear no murmur no gallop there is trace lower extremity edema. Respiratory: Breath sounds are diminished throughout but I hear fair air flow fine crackles bilateral bases no wheeze her effort is normal with conversation Abdomen: soft, ntnd other bowel sounds no guarding no rebound Skin: no rash or induration seen on limited exam Musculoskeletal: grossly normal tone BUE/BLE Psychiatric: grossly normal mood and affect, speech fluent and appropriate Neurologic: grossly non-focal. Speech clear facial symmetry           Labs on Admission:  Basic Metabolic Panel:  Recent Labs Lab 11/22/14 0631  NA 141  K 4.2  CL 109  CO2 23  GLUCOSE 109*  BUN 14  CREATININE 0.87  CALCIUM 9.1   Liver Function Tests: No results for input(s): AST, ALT, ALKPHOS, BILITOT, PROT, ALBUMIN in the last 168 hours. No results for input(s): LIPASE,  AMYLASE in the last 168 hours. No results for input(s): AMMONIA in the last 168 hours. CBC:  Recent Labs Lab 11/22/14 0631  WBC 7.9  NEUTROABS 6.3  HGB 14.4  HCT 45.7  MCV 111.5*  PLT 779*   Cardiac Enzymes:  Recent Labs Lab 11/22/14 0631  TROPONINI <0.03    BNP (last 3 results)  Recent Labs  11/22/14 0631  BNP 456.0*    ProBNP (last 3 results) No results for input(s): PROBNP in the last 8760 hours.  CBG: No results for input(s): GLUCAP in the last 168 hours.  Radiological Exams on Admission: Dg Chest Portable 1 View  11/22/2014   CLINICAL DATA:  Shortness of breath this morning. History of atrial fibrillation, polycythemia vera.  EXAM: PORTABLE CHEST - 1 VIEW  COMPARISON:  Chest radiograph October 14, 2013  FINDINGS: The cardiac silhouette is appears mild to moderately enlarged, mediastinal silhouette is nonsuspicious. Pulmonary vascular congestion, patchy LEFT greater than RIGHT lower lobe consolidation with small to moderate LEFT, small RIGHT pleural effusions. No pneumothorax. Mild  osteopenia. Soft tissue planes are nonsuspicious.  IMPRESSION: Mild-to-moderate cardiomegaly. Pulmonary vascular congestion. LEFT greater than RIGHT alveolar airspace opacities and small pleural effusions concerning for pulmonary edema, though superimposed pneumonia cannot be excluded. Recommend followup chest radiograph after treatment to verify improvement.   Electronically Signed   By: Elon Alas   On: 11/22/2014 06:16    EKG: Independently reviewed A. fib  Assessment/Plan Principal Problem:   Dyspnea: likely related to pulmonary edema confirmed on chest xray concerning, for acute chf in setting of chronic afib and hx low DLCO. Last echo 2013 chart review indicates a normal EF at that time. Admit to telemetry we'll continue IV Lasix will cycle troponins and obtain a 2-D echo. Repeat EKG in the morning. Monitor intake and output and obtain daily weights. Will continue home aspirin and metoprolol. Will consider repeating chest x-ray in the a.m. Chart review indicates last cardiology visit September 2015 and pulmonary referral was made the patient did not follow-up. Cardiology consult  Active Problems:  Chronic atrial fibrillation: Currently rate controlled. INR therapeutic. Will request pharmacy for Coumadin dosing. Monitor on telemetry.    HTN (hypertension): At time of my exam blood pressure trending towards high end of normal. However she not taken her morning medications. Will continue her Cardizem and beta blocker. Monitor  Pulmonary edema/  Pleural effusion: Per chest x-ray. Likely contributing to #1. See #1 plan.  Hypothyroidism: We'll obtain a TSH. Continue her home Synthroid  Thrombocytosis: Latest to polycythemia vera. Last month platelets 677. Trending up.    Polycythemia vera: Currently followed by Dr. Whitney Muse. Chart review indicates recently taken off Hydrea due to a decline in hemoglobin and hematocrit.        Peripheral vascular disease: Appears stable at baseline       Hard of hearing: stable   cardiology  Code Status: full DVT Prophylaxis: Family Communication: none present Disposition Plan: home when ready  Time spent: 27 minutes  [facility] Pager [phone]

## 2014-11-22 NOTE — ED Notes (Signed)
Emptied bedside commode of 850 ml urine.

## 2014-11-23 ENCOUNTER — Other Ambulatory Visit (HOSPITAL_COMMUNITY): Payer: Medicare Other

## 2014-11-23 ENCOUNTER — Inpatient Hospital Stay (HOSPITAL_COMMUNITY): Payer: Medicare Other

## 2014-11-23 DIAGNOSIS — I5033 Acute on chronic diastolic (congestive) heart failure: Secondary | ICD-10-CM | POA: Insufficient documentation

## 2014-11-23 DIAGNOSIS — I5031 Acute diastolic (congestive) heart failure: Secondary | ICD-10-CM

## 2014-11-23 DIAGNOSIS — E039 Hypothyroidism, unspecified: Secondary | ICD-10-CM

## 2014-11-23 DIAGNOSIS — J9601 Acute respiratory failure with hypoxia: Secondary | ICD-10-CM

## 2014-11-23 LAB — COMPREHENSIVE METABOLIC PANEL
ALT: 11 U/L (ref 0–35)
AST: 20 U/L (ref 0–37)
Albumin: 3.4 g/dL — ABNORMAL LOW (ref 3.5–5.2)
Alkaline Phosphatase: 72 U/L (ref 39–117)
Anion gap: 10 (ref 5–15)
BUN: 15 mg/dL (ref 6–23)
CO2: 24 mmol/L (ref 19–32)
Calcium: 8.7 mg/dL (ref 8.4–10.5)
Chloride: 106 mmol/L (ref 96–112)
Creatinine, Ser: 0.85 mg/dL (ref 0.50–1.10)
GFR calc Af Amer: 77 mL/min — ABNORMAL LOW (ref >90)
GFR calc non Af Amer: 67 mL/min — ABNORMAL LOW (ref >90)
GLUCOSE: 98 mg/dL (ref 70–99)
Potassium: 3.4 mmol/L — ABNORMAL LOW (ref 3.5–5.1)
Sodium: 140 mmol/L (ref 135–145)
Total Bilirubin: 1.3 mg/dL — ABNORMAL HIGH (ref 0.3–1.2)
Total Protein: 6.1 g/dL (ref 6.0–8.3)

## 2014-11-23 LAB — TROPONIN I: Troponin I: <0.03 ng/mL (ref <0.031)

## 2014-11-23 LAB — PROTIME-INR
INR: 2.36 — ABNORMAL HIGH (ref 0.00–1.49)
PROTHROMBIN TIME: 26 s — AB (ref 11.6–15.2)

## 2014-11-23 MED ORDER — FUROSEMIDE 10 MG/ML IJ SOLN
20.0000 mg | Freq: Once | INTRAMUSCULAR | Status: AC
  Administered 2014-11-23: 20 mg via INTRAVENOUS
  Filled 2014-11-23: qty 2

## 2014-11-23 MED ORDER — METOPROLOL SUCCINATE ER 50 MG PO TB24
150.0000 mg | ORAL_TABLET | Freq: Two times a day (BID) | ORAL | Status: DC
  Filled 2014-11-23: qty 3

## 2014-11-23 MED ORDER — IPRATROPIUM-ALBUTEROL 0.5-2.5 (3) MG/3ML IN SOLN
3.0000 mL | RESPIRATORY_TRACT | Status: DC
  Administered 2014-11-23 – 2014-11-24 (×10): 3 mL via RESPIRATORY_TRACT
  Filled 2014-11-23 (×11): qty 3

## 2014-11-23 MED ORDER — METOPROLOL SUCCINATE ER 50 MG PO TB24
100.0000 mg | ORAL_TABLET | Freq: Two times a day (BID) | ORAL | Status: DC
  Administered 2014-11-24 – 2014-11-29 (×11): 100 mg via ORAL
  Filled 2014-11-23 (×12): qty 2

## 2014-11-23 MED ORDER — DILTIAZEM HCL ER COATED BEADS 180 MG PO CP24
300.0000 mg | ORAL_CAPSULE | Freq: Every day | ORAL | Status: DC
  Administered 2014-11-24 – 2014-11-29 (×6): 300 mg via ORAL
  Filled 2014-11-23 (×12): qty 1

## 2014-11-23 MED ORDER — POTASSIUM CHLORIDE CRYS ER 20 MEQ PO TBCR
40.0000 meq | EXTENDED_RELEASE_TABLET | Freq: Once | ORAL | Status: AC
  Administered 2014-11-23: 40 meq via ORAL
  Filled 2014-11-23: qty 2

## 2014-11-23 MED ORDER — FUROSEMIDE 10 MG/ML IJ SOLN
60.0000 mg | Freq: Two times a day (BID) | INTRAMUSCULAR | Status: DC
  Administered 2014-11-23 – 2014-11-25 (×4): 60 mg via INTRAVENOUS
  Filled 2014-11-23 (×4): qty 6

## 2014-11-23 MED ORDER — WARFARIN SODIUM 5 MG PO TABS
5.0000 mg | ORAL_TABLET | Freq: Once | ORAL | Status: AC
  Administered 2014-11-23: 5 mg via ORAL
  Filled 2014-11-23: qty 1

## 2014-11-23 MED ORDER — METOPROLOL SUCCINATE ER 50 MG PO TB24
150.0000 mg | ORAL_TABLET | Freq: Two times a day (BID) | ORAL | Status: DC
  Administered 2014-11-23: 150 mg via ORAL

## 2014-11-23 NOTE — Progress Notes (Signed)
Patient started on incentive. Patient saturation on 5lpm/[place] run 88-91.

## 2014-11-23 NOTE — Progress Notes (Signed)
UR chart review completed.  

## 2014-11-23 NOTE — Progress Notes (Signed)
[facility] for Warfarin Indication: atrial fibrillation, Hx DVT  No Known Allergies  Patient Measurements: Height: 6' (182.9 cm) Weight: 181 lb 4.8 oz (82.237 kg) IBW/kg (Calculated) : 73.1  Vital Signs: Temp: 98 F (36.7 C) (04/21 0458) Temp Source: Oral (04/21 0458) BP: 120/82 mmHg (04/21 1003) Pulse Rate: 105 (04/21 1024)  Labs:  Recent Labs  11/22/14 0631 11/22/14 1324 11/23/14 0610  HGB 14.4  --   --   HCT 45.7  --   --   PLT 779*  --   --   LABPROT 27.0*  --  26.0*  INR 2.47*  --  2.36*  CREATININE 0.87  --  0.85  TROPONINI <0.03 <0.03 <0.03    Estimated Creatinine Clearance: 69 mL/min (by C-G formula based on Cr of 0.85).   Medical History: Past Medical History  Diagnosis Date  . Hypertension   . Arthritis   . Varicose veins   . Deaf   . Elevated WBC count     and platelets  . Thyroid disease   . Irregular heart rate   . 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 Peripheral neuropathy     feet  . 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 Atrial fibrillation, chronic   . 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 Peripheral vascular disease   . Ulcer of ankle 05/2014    left and right ankle   Medications:  Prescriptions prior to admission  Medication Sig Dispense Refill Last Dose  . aspirin EC 81 MG tablet Take 81 mg by mouth daily.   11/21/2014 at 0500  . Cholecalciferol (VITAMIN D3) 5000 UNITS CAPS Take 1 capsule by mouth daily.   11/21/2014 at 1500  . diltiazem (CARDIZEM CD) 240 MG 24 hr capsule Take 1 capsule (240 mg total) by mouth daily. 90 capsule 3 11/22/2014 at 0500  . gabapentin (NEURONTIN) 100 MG capsule Take 100 mg by mouth 3 (three) times daily.     11/21/2014 at 2000  . levothyroxine (SYNTHROID, LEVOTHROID) 75 MCG tablet    11/21/2014 at 0500  . metoprolol (TOPROL-XL) 100 MG 24 hr tablet Take 100 mg by mouth 2 (two) times daily.     11/22/2014 at 0500  . warfarin (COUMADIN) 5 MG tablet Take 1 tablet daily except  1/2 tablet on Mondays, Wednesdays and Fridays (Patient taking differently: Take 2.5-5 mg by mouth See admin instructions. Take 1 tablet daily except 1/2 tablet on Mondays, Wednesdays, Fridays and Saturdays.) 30 tablet 6 11/21/2014 at 1800    Assessment: Okay for Protocol, INR @ goal.  Last dose 4/19.  Goal of Therapy:  INR 2-3   Plan:  Warfarin 5mg  PO today. Daily PT/INR.  Pricilla Larsson 11/23/2014,11:03 AM

## 2014-11-23 NOTE — Progress Notes (Signed)
[facility] PROGRESS NOTE  Charlotte [name] [id number] DOB: 03/16/1941 DOA: 11/22/2014 PCP: [name], MD  Assessment/Plan: 1. Acute respiratory failure with hypoxia. Related to CHF. Patient is requiring increasing amounts of oxygen. Currently on 6 L but does not appear to be in any distress. Agree with rechecking chest x-ray. 2. Acute diastolic congestive heart failure. Cardiology following. Increase in Lasix dosing today. Continue to monitor intake and output. 3. Hypertension. Stable 4. Chronic atrial fibrillation. Rate controlled. Anticoagulated with Coumadin. 5. Hypothyroidism. TSH normal. Continue Synthroid 6. Bilateral pleural effusions. Related to #2. Continue to current treatments 7. Polycythemia vera. Follow-up with hematology as an outpatient.   Code Status: full code Family Communication: discussed with patient Disposition Plan: discharge home once improved   Consultants:  Cardiology  Procedures:  Echo: - Left ventricle: The cavity size was normal. Wall thickness was normal. Systolic function was normal. The estimated ejection fraction was in the range of 55% to 60%. Wall motion was normal; there were no regional wall motion abnormalities. - Aortic valve: Mildly calcified annulus. Trileaflet; mildly thickened leaflets. Valve area (VTI): 2.15 cm^2. Valve area (Vmax): 2.35 cm^2. - Mitral valve: Mildly calcified annulus. Mildly thickened leaflets . There was mild regurgitation. - Left atrium: The atrium was severely dilated. - Right ventricle: The cavity size was mildly dilated. - Right atrium: The atrium was severely dilated. - Atrial septum: No defect or patent foramen ovale was identified. - Pulmonary arteries: Systolic pressure was moderately increased. PA peak pressure: 50 mm Hg (S). - Inferior vena cava: The vessel was dilated. The respirophasic diameter changes were blunted (< 50%), consistent with elevated central venous  pressure. - Pericardium, extracardiac: There is a large left pleural effusion.  Antibiotics:    HPI/Subjective: Patient feels that shortness of breath is getting better. No cough. She is now on 6L nasal cannula but does not describe worsening shortness of breath  Objective: Filed Vitals:   11/23/14 1024  BP:   Pulse: 105  Temp:   Resp:     Intake/Output Summary (Last 24 hours) at 11/23/14 1049 Last data filed at 11/23/14 0917  Gross per 24 hour  Intake    600 ml  Output      0 ml  Net    600 ml   Filed Weights   11/22/14 0557 11/22/14 0940 11/23/14 0458  Weight: 80.74 kg (178 lb) 81.3 kg (179 lb 3.7 oz) 82.237 kg (181 lb 4.8 oz)    Exam:   General:  NAD, sitting up in bed, appears comfortable  Cardiovascular: s1, s2, irregular  Respiratory: good air movement bilaterally, no wheezes  Abdomen: soft, nt, nd, bs+  Musculoskeletal: no edema b/l   Data Reviewed: Basic Metabolic Panel:  Recent Labs Lab 11/22/14 0631 11/23/14 0610  NA 141 140  K 4.2 3.4*  CL 109 106  CO2 23 24  GLUCOSE 109* 98  BUN 14 15  CREATININE 0.87 0.85  CALCIUM 9.1 8.7   Liver Function Tests:  Recent Labs Lab 11/23/14 0610  AST 20  ALT 11  ALKPHOS 72  BILITOT 1.3*  PROT 6.1  ALBUMIN 3.4*   No results for input(s): LIPASE, AMYLASE in the last 168 hours. No results for input(s): AMMONIA in the last 168 hours. CBC:  Recent Labs Lab 11/22/14 0631  WBC 7.9  NEUTROABS 6.3  HGB 14.4  HCT 45.7  MCV 111.5*  PLT 779*   Cardiac Enzymes:  Recent Labs Lab 11/22/14 0631 11/22/14 1324 11/23/14 0610  TROPONINI <  0.03 <0.03 <0.03   BNP (last 3 results)  Recent Labs  11/22/14 0631  BNP 456.0*    ProBNP (last 3 results) No results for input(s): PROBNP in the last 8760 hours.  CBG: No results for input(s): GLUCAP in the last 168 hours.  No results found for this or any previous visit (from the past 240 hour(s)).   Studies: Dg Chest Portable 1  View  11/22/2014   CLINICAL DATA:  Shortness of breath this morning. History of atrial fibrillation, polycythemia vera.  EXAM: PORTABLE CHEST - 1 VIEW  COMPARISON:  Chest radiograph October 14, 2013  FINDINGS: The cardiac silhouette is appears mild to moderately enlarged, mediastinal silhouette is nonsuspicious. Pulmonary vascular congestion, patchy LEFT greater than RIGHT lower lobe consolidation with small to moderate LEFT, small RIGHT pleural effusions. No pneumothorax. Mild osteopenia. Soft tissue planes are nonsuspicious.  IMPRESSION: Mild-to-moderate cardiomegaly. Pulmonary vascular congestion. LEFT greater than RIGHT alveolar airspace opacities and small pleural effusions concerning for pulmonary edema, though superimposed pneumonia cannot be excluded. Recommend followup chest radiograph after treatment to verify improvement.   Electronically Signed   By: Elon Alas   On: 11/22/2014 06:16    Scheduled Meds: . aspirin EC  81 mg Oral Daily  . [START ON 11/24/2014] diltiazem  300 mg Oral Daily  . furosemide  20 mg Intravenous Once  . furosemide  60 mg Intravenous Q12H  . gabapentin  100 mg Oral TID  . ipratropium-albuterol  3 mL Nebulization Q4H WA  . levothyroxine  75 mcg Oral QAC breakfast  . metoprolol succinate  100 mg Oral BID  . sodium chloride  3 mL Intravenous Q12H  . sodium chloride  3 mL Intravenous Q12H  . Warfarin - Pharmacist Dosing Inpatient   Does not apply Q24H   Continuous Infusions:   Principal Problem:   Acute respiratory failure with hypoxia Active Problems:   Polycythemia vera   Chronic atrial fibrillation   HTN (hypertension)   Hypothyroidism   Peripheral vascular disease   Pulmonary edema   Dyspnea   Pleural effusion   Hard of hearing   Thrombocytosis   Acute heart failure   Acute pulmonary edema    Time spent: 82mins    Carmalita Wakefield  [facility] Pager [phone]. If 7PM-7AM, please contact night-coverage at [email or web address], password  [facility] 11/23/2014, 10:49 AM  LOS: 1 day

## 2014-11-23 NOTE — Care Management Note (Addendum)
    Page 1 of 2   11/28/2014     2:02:44 PM CARE MANAGEMENT NOTE 11/28/2014  Patient:  Charlotte [name], Charlotte [name]   Account Number:  [id number]  Date Initiated:  11/23/2014  Documentation initiated by:  Theophilus Kinds  Subjective/Objective Assessment:   Pt admitted from home with acute respiratory failure and CHF. Pt lives with her husband and will return home at discharge. Pt is independent with ADL's.     Action/Plan:   No Cm needs noted. Anticipate discharge within 24 hours.   Anticipated DC Date:  11/24/2014   Anticipated DC Plan:  [place]  CM consult      PAC Choice  DURABLE MEDICAL EQUIPMENT   Choice offered to / List presented to:  [name]   DME arranged  [facility]      DME agency  [facility].        Status of service:  Completed, signed off Medicare Important Message given?  YES (If response is "NO", the following Medicare IM given date fields will be blank) Date Medicare IM given:  11/24/2014 Medicare IM given by:  Christinia Gully C Date Additional Medicare IM given:  11/28/2014 Additional Medicare IM given by:  JESSICA CHILDRESS  Discharge Disposition:  HOME/SELF CARE  Per UR Regulation:    If discussed at Long Length of Stay Meetings, dates discussed:   11/28/2014    Comments:  11/28/2014 [place], R,N MSN, CM Pt discharging home today. Pt meets criteria for home O2. Pt has choosen [facility] for DME needs. Emma, of [facility], has been notified and will obtain pt info from chart and have O2 delivered to pt room prior to discharge. No further CM needs.  11/24/14 [place], RN BSN CM Potential discharge over the weekend. If pt needs home health or home O2/neb machine, pt would like that arranged with [place].  11/23/14 [place], RN BSN CM

## 2014-11-23 NOTE — Progress Notes (Signed)
Consulting cardiologist: Carlyle Dolly MD Primary Cardiologist: Kate Sable MD  Cardiology Specific Problem List:  1. Chronic Atrial fibrillation-CHADS VASC Score 4. 2. Hypertension 3. Acute CHF   Subjective:    Breathing much better. Wants to go home.   Objective:   Temp:  [97.4 F (36.3 C)-98 F (36.7 C)] 98 F (36.7 C) (04/21 0458) Pulse Rate:  [79-98] 97 (04/21 0458) Resp:  [13-18] 18 (04/21 0458) BP: (123-165)/(68-97) 138/89 mmHg (04/21 0458) SpO2:  [88 %-93 %] 88 % (04/21 0736) Weight:  [179 lb 3.7 oz (81.3 kg)-181 lb 4.8 oz (82.237 kg)] 181 lb 4.8 oz (82.237 kg) (04/21 0458) Last BM Date: 11/22/14  Filed Weights   11/22/14 0557 11/22/14 0940 11/23/14 0458  Weight: 178 lb (80.74 kg) 179 lb 3.7 oz (81.3 kg) 181 lb 4.8 oz (82.237 kg)    Intake/Output Summary (Last 24 hours) at 11/23/14 0810 Last data filed at 11/23/14 0443  Gross per 24 hour  Intake    360 ml  Output      0 ml  Net    360 ml    Telemetry: Atrial fib rates in the 90-110 bpm range.   Exam:  General: No acute distress.  HEENT: Conjunctiva and lids normal, oropharynx clear.  Lungs: Clear to auscultation, nonlabored.Slgihtlyly diminished in the left lower lobe.   Cardiac: No elevated JVP or bruits. IRRR, tachycardic, no gallop or rub.   Abdomen: Normoactive bowel sounds, nontender, nondistended.  Extremities: No pitting edema, distal pulses full.Woody thickened skin changes bilateral LE  Neuropsychiatric: Alert and oriented x3, affect appropriate. Hard of hearing.   Echocardiogram 11/22/2014 Left ventricle: The cavity size was normal. Wall thickness was normal. Systolic function was normal. The estimated ejection fraction was in the range of 55% to 60%. Wall motion was normal; there were no regional wall motion abnormalities. - Aortic valve: Mildly calcified annulus. Trileaflet; mildly thickened leaflets. Valve area (VTI): 2.15 cm^2. Valve area (Vmax): 2.35  cm^2. - Mitral valve: Mildly calcified annulus. Mildly thickened leaflets . There was mild regurgitation. - Left atrium: The atrium was severely dilated. - Right ventricle: The cavity size was mildly dilated. - Right atrium: The atrium was severely dilated. - Atrial septum: No defect or patent foramen ovale was identified. - Pulmonary arteries: Systolic pressure was moderately increased. PA peak pressure: 50 mm Hg (S). - Inferior vena cava: The vessel was dilated. The respirophasic diameter changes were blunted (< 50%), consistent with elevated central venous pressure. - Pericardium, extracardiac: There is a large left pleural effusion. - Technically adequate study.  Lab Results:  Basic Metabolic Panel:  Recent Labs Lab 11/22/14 0631 11/23/14 0610  NA 141 140  K 4.2 3.4*  CL 109 106  CO2 23 24  GLUCOSE 109* 98  BUN 14 15  CREATININE 0.87 0.85  CALCIUM 9.1 8.7    Liver Function Tests:  Recent Labs Lab 11/23/14 0610  AST 20  ALT 11  ALKPHOS 72  BILITOT 1.3*  PROT 6.1  ALBUMIN 3.4*    CBC:  Recent Labs Lab 11/22/14 0631  WBC 7.9  HGB 14.4  HCT 45.7  MCV 111.5*  PLT 779*    Cardiac Enzymes:  Recent Labs Lab 11/22/14 0631 11/22/14 1324 11/23/14 0610  TROPONINI <0.03 <0.03 <0.03    Coagulation:  Recent Labs Lab 11/22/14 0631 11/23/14 0610  INR 2.47* 2.36*    Radiology: Dg Chest Portable 1 View  11/22/2014   CLINICAL DATA:  Shortness of breath this morning. History  of atrial fibrillation, polycythemia vera.  EXAM: PORTABLE CHEST - 1 VIEW  COMPARISON:  Chest radiograph October 14, 2013  FINDINGS: The cardiac silhouette is appears mild to moderately enlarged, mediastinal silhouette is nonsuspicious. Pulmonary vascular congestion, patchy LEFT greater than RIGHT lower lobe consolidation with small to moderate LEFT, small RIGHT pleural effusions. No pneumothorax. Mild osteopenia. Soft tissue planes are nonsuspicious.  IMPRESSION:  Mild-to-moderate cardiomegaly. Pulmonary vascular congestion. LEFT greater than RIGHT alveolar airspace opacities and small pleural effusions concerning for pulmonary edema, though superimposed pneumonia cannot be excluded. Recommend followup chest radiograph after treatment to verify improvement.   Electronically Signed   By: Elon Alas   On: 11/22/2014 06:16     Medications:   Scheduled Medications: . aspirin EC  81 mg Oral Daily  . diltiazem  240 mg Oral Daily  . furosemide  40 mg Intravenous Q12H  . gabapentin  100 mg Oral TID  . ipratropium-albuterol  3 mL Nebulization Q4H WA  . levothyroxine  75 mcg Oral QAC breakfast  . metoprolol succinate  100 mg Oral BID  . sodium chloride  3 mL Intravenous Q12H  . sodium chloride  3 mL Intravenous Q12H  . Warfarin - Pharmacist Dosing Inpatient   Does not apply Q24H      PRN Medications: sodium chloride, acetaminophen **OR** acetaminophen, alum & mag hydroxide-simeth, hydrALAZINE, HYDROcodone-acetaminophen, ondansetron **OR** ondansetron (ZOFRAN) IV, polyethylene glycol, sodium chloride, traZODone   Assessment and Plan:   1. Atrial fibrillation: Heart rate is not well controlled at rest, but she is without complaint. I will increase dilt cd to 300mg  daily. Coumadin with CHADS VASC Score of 4. INR this am.2.36.   2. CHF: She appears to be well-compensated currently, states that she has urinated a lot, and is breathing better. I/0 documentation in unimpressive, wts are higher. Still on IV lasix 40 mg BID. Echo demonstrates normal EF with high pulmonary pressures  No concern for PE as she has been on coumadin. Creatinine 0.85. CO2 24. Potassium 3.4. Will repleate  Will repeat CXR. If pleural effusion is still large, recommend decubitus films. Will order strict I/O.   3.Hypertension: Stable.  [name]. Lawrence NP [facility]  11/23/2014, 8:10 AM   Patient seen and discussed with NP Purcell Nails, I agree with her documentation above. Echo with  normal LVEF 55-60%, cannot eval diastolic function due to afib, however severe biatrial enlargement suggests significant diastolic dysfunction. PASP 50 ( likely due to left sided diastolic dysfunction,she is also being worked up for decreased Grant on PFTs), dilated IVC. I/Os are incomplete, wts are overall stable. She has been on lasix 40mg  IV bid, renal function remains stable. Will increase IV lasix to 60mg  bid. Afib with some elevated rates a times, will increase dilt cd to 300mg  daily.   Zandra Abts MD

## 2014-11-24 DIAGNOSIS — D45 Polycythemia vera: Secondary | ICD-10-CM

## 2014-11-24 DIAGNOSIS — J90 Pleural effusion, not elsewhere classified: Secondary | ICD-10-CM | POA: Insufficient documentation

## 2014-11-24 DIAGNOSIS — J948 Other specified pleural conditions: Secondary | ICD-10-CM

## 2014-11-24 LAB — BASIC METABOLIC PANEL
ANION GAP: 10 (ref 5–15)
BUN: 16 mg/dL (ref 6–23)
CALCIUM: 8.8 mg/dL (ref 8.4–10.5)
CO2: 24 mmol/L (ref 19–32)
Chloride: 104 mmol/L (ref 96–112)
Creatinine, Ser: 0.97 mg/dL (ref 0.50–1.10)
GFR, EST AFRICAN AMERICAN: 66 mL/min — AB (ref >90)
GFR, EST NON AFRICAN AMERICAN: 57 mL/min — AB (ref >90)
GLUCOSE: 91 mg/dL (ref 70–99)
POTASSIUM: 3.8 mmol/L (ref 3.5–5.1)
SODIUM: 138 mmol/L (ref 135–145)

## 2014-11-24 LAB — PROTIME-INR
INR: 1.98 — AB (ref 0.00–1.49)
Prothrombin Time: 22.7 seconds — ABNORMAL HIGH (ref 11.6–15.2)

## 2014-11-24 MED ORDER — WARFARIN SODIUM 5 MG PO TABS
5.0000 mg | ORAL_TABLET | Freq: Once | ORAL | Status: AC
  Administered 2014-11-24: 5 mg via ORAL
  Filled 2014-11-24: qty 1

## 2014-11-24 MED ORDER — WARFARIN - PHARMACIST DOSING INPATIENT
Status: DC
  Administered 2014-11-24: 16:00:00
  Administered 2014-11-25: 1

## 2014-11-24 NOTE — Progress Notes (Signed)
[facility] PROGRESS NOTE  Charlotte [name] [id number] DOB: 11-Aug-1940 DOA: 11/22/2014 PCP: [name], MD  Assessment/Plan: 1. Acute respiratory failure with hypoxia. Related to CHF. Patient is requiring increasing amounts of oxygen. Currently on 6 L but does not appear to be in any distress. We'll continue to try to wean down oxygen as tolerated. Continue to consider thoracentesis if she is unable to wean. 2. Acute diastolic congestive heart failure. Cardiology following. She is on 60 mg of IV Lasix twice a day. Intake and output has not been accurately recorded. Chest x-ray repeated on 4/21 had showed improvement of congestion. Continue current treatments 3. Hypertension. Stable 4. Chronic atrial fibrillation. Rate controlled. Anticoagulated with Coumadin. Will hold Coumadin for now in case thoracentesis as needed. 5. Hypothyroidism. TSH normal. Continue Synthroid 6. Bilateral pleural effusions. Related to #2. Continue to current treatments. May need to consider thoracentesis if she does not continue to improve 7. Polycythemia vera. Follow-up with hematology as an outpatient.   Code Status: full code Family Communication: discussed with patient Disposition Plan: discharge home once improved   Consultants:  Cardiology  Procedures:  Echo: - Left ventricle: The cavity size was normal. Wall thickness was normal. Systolic function was normal. The estimated ejection fraction was in the range of 55% to 60%. Wall motion was normal; there were no regional wall motion abnormalities. - Aortic valve: Mildly calcified annulus. Trileaflet; mildly thickened leaflets. Valve area (VTI): 2.15 cm^2. Valve area (Vmax): 2.35 cm^2. - Mitral valve: Mildly calcified annulus. Mildly thickened leaflets . There was mild regurgitation. - Left atrium: The atrium was severely dilated. - Right ventricle: The cavity size was mildly dilated. - Right atrium: The atrium was  severely dilated. - Atrial septum: No defect or patent foramen ovale was identified. - Pulmonary arteries: Systolic pressure was moderately increased. PA peak pressure: 50 mm Hg (S). - Inferior vena cava: The vessel was dilated. The respirophasic diameter changes were blunted (< 50%), consistent with elevated central venous pressure. - Pericardium, extracardiac: There is a large left pleural effusion.  Antibiotics:    HPI/Subjective: Feeling better. Shortness of breath improving. Reports fair urine output  Objective: Filed Vitals:   11/24/14 0607  BP: 137/84  Pulse: 87  Temp: 97.8 F (36.6 C)  Resp: 20    Intake/Output Summary (Last 24 hours) at 11/24/14 0919 Last data filed at 11/24/14 0900  Gross per 24 hour  Intake    720 ml  Output      0 ml  Net    720 ml   Filed Weights   11/22/14 0940 11/23/14 0458 11/24/14 0607  Weight: 81.3 kg (179 lb 3.7 oz) 82.237 kg (181 lb 4.8 oz) 81.829 kg (180 lb 6.4 oz)    Exam:   General:  NAD, sitting up in bed, appears comfortable, normal resp effort  Cardiovascular: s1, s2, irregular  Respiratory: clear breath sounds bilaterally, diminished at bases L>R  Abdomen: soft, nt, nd, bs+  Musculoskeletal: no edema b/l   Data Reviewed: Basic Metabolic Panel:  Recent Labs Lab 11/22/14 0631 11/23/14 0610 11/24/14 0631  NA 141 140 138  K 4.2 3.4* 3.8  CL 109 106 104  CO2 23 24 24   GLUCOSE 109* 98 91  BUN 14 15 16   CREATININE 0.87 0.85 0.97  CALCIUM 9.1 8.7 8.8   Liver Function Tests:  Recent Labs Lab 11/23/14 0610  AST 20  ALT 11  ALKPHOS 72  BILITOT 1.3*  PROT 6.1  ALBUMIN 3.4*  No results for input(s): LIPASE, AMYLASE in the last 168 hours. No results for input(s): AMMONIA in the last 168 hours. CBC:  Recent Labs Lab 11/22/14 0631  WBC 7.9  NEUTROABS 6.3  HGB 14.4  HCT 45.7  MCV 111.5*  PLT 779*   Cardiac Enzymes:  Recent Labs Lab 11/22/14 0631 11/22/14 1324 11/23/14 0610   TROPONINI <0.03 <0.03 <0.03   BNP (last 3 results)  Recent Labs  11/22/14 0631  BNP 456.0*    ProBNP (last 3 results) No results for input(s): PROBNP in the last 8760 hours.  CBG: No results for input(s): GLUCAP in the last 168 hours.  No results found for this or any previous visit (from the past 240 hour(s)).   Studies: Dg Chest 2 View  11/23/2014   CLINICAL DATA:  Left-sided pleural effusion with shortness of Breath  EXAM: CHEST  2 VIEW  COMPARISON:  11/22/2014  FINDINGS: Bilateral pleural effusions are again seen left greater than right. The degree of bibasilar opacities has improved in the interval from the prior exam. No new focal infiltrate is seen. No bony abnormality is noted.  IMPRESSION: Bilateral pleural effusions as described. Improved aeration is noted bilaterally.   Electronically Signed   By: Inez Catalina M.D.   On: 11/23/2014 12:23    Scheduled Meds: . aspirin EC  81 mg Oral Daily  . diltiazem  300 mg Oral Daily  . furosemide  60 mg Intravenous Q12H  . gabapentin  100 mg Oral TID  . ipratropium-albuterol  3 mL Nebulization Q4H WA  . levothyroxine  75 mcg Oral QAC breakfast  . metoprolol succinate  100 mg Oral BID  . sodium chloride  3 mL Intravenous Q12H  . sodium chloride  3 mL Intravenous Q12H  . Warfarin - Pharmacist Dosing Inpatient   Does not apply Q24H   Continuous Infusions:   Principal Problem:   Acute respiratory failure with hypoxia Active Problems:   Polycythemia vera   Chronic atrial fibrillation   HTN (hypertension)   Hypothyroidism   Peripheral vascular disease   Pulmonary edema   Dyspnea   Pleural effusion   Hard of hearing   Thrombocytosis   Acute heart failure   Acute pulmonary edema   Acute diastolic congestive heart failure    Time spent: 46mins    Charlotte [name]  [facility] Pager [phone]. If 7PM-7AM, please contact night-coverage at [email or web address], password [facility] 11/24/2014, 9:19 AM  LOS: 2 days

## 2014-11-24 NOTE — Progress Notes (Signed)
Consulting cardiologist: Carlyle Dolly MD Primary Cardiologist: Kate Sable MD  Cardiology Specific Problem List:  1. Chronic Atrial fibrillation-CHADS VASC Score 4. 2. Hypertension 3. Acute CHF   Subjective:    Breathing well good night sleep   Objective:   Temp:  [97.7 F (36.5 C)-97.8 F (36.6 C)] 97.8 F (36.6 C) (04/22 0607) Pulse Rate:  [51-105] 87 (04/22 0607) Resp:  [20] 20 (04/22 0607) BP: (120-137)/(64-84) 137/84 mmHg (04/22 0607) SpO2:  [87 %-95 %] 94 % (04/22 0757) Weight:  [180 lb 6.4 oz (81.829 kg)] 180 lb 6.4 oz (81.829 kg) (04/22 0607) Last BM Date: 11/22/14  Filed Weights   11/22/14 0940 11/23/14 0458 11/24/14 [id number]  Weight: 179 lb 3.7 oz (81.3 kg) 181 lb 4.8 oz (82.237 kg) 180 lb 6.4 oz (81.829 kg)    Intake/Output Summary (Last 24 hours) at 11/24/14 0848 Last data filed at 11/23/14 2105  Gross per 24 hour  Intake    720 ml  Output      0 ml  Net    720 ml    Telemetry: Atrial fib rates in the 80-100  bpm range.   Exam:  General: No acute distress.  HEENT: Conjunctiva and lids normal, oropharynx clear.  Lungs: Clear to auscultation, nonlabored.Slgihtlyly diminished in the left lower lobe.   Cardiac: No elevated JVP or bruits. IRRR, tachycardic, no gallop or rub.   Abdomen: Normoactive bowel sounds, nontender, nondistended.  Extremities: No pitting edema, distal pulses full.Woody thickened skin changes bilateral LE  Neuropsychiatric: Alert and oriented x3, affect appropriate. Hard of hearing.   Echocardiogram 11/22/2014 Left ventricle: The cavity size was normal. Wall thickness was normal. Systolic function was normal. The estimated ejection fraction was in the range of 55% to 60%. Wall motion was normal; there were no regional wall motion abnormalities. - Aortic valve: Mildly calcified annulus. Trileaflet; mildly thickened leaflets. Valve area (VTI): 2.15 cm^2. Valve area (Vmax): 2.35 cm^2. - Mitral valve:  Mildly calcified annulus. Mildly thickened leaflets . There was mild regurgitation. - Left atrium: The atrium was severely dilated. - Right ventricle: The cavity size was mildly dilated. - Right atrium: The atrium was severely dilated. - Atrial septum: No defect or patent foramen ovale was identified. - Pulmonary arteries: Systolic pressure was moderately increased. PA peak pressure: 50 mm Hg (S). - Inferior vena cava: The vessel was dilated. The respirophasic diameter changes were blunted (< 50%), consistent with elevated central venous pressure. - Pericardium, extracardiac: There is a large left pleural effusion. - Technically adequate study.  Lab Results:  Basic Metabolic Panel:  Recent Labs Lab 11/22/14 0631 11/23/14 0610 11/24/14 0631  NA 141 140 138  K 4.2 3.4* 3.8  CL 109 106 104  CO2 23 24 24   GLUCOSE 109* 98 91  BUN 14 15 16   CREATININE 0.87 0.85 0.97  CALCIUM 9.1 8.7 8.8    Liver Function Tests:  Recent Labs Lab 11/23/14 0610  AST 20  ALT 11  ALKPHOS 72  BILITOT 1.3*  PROT 6.1  ALBUMIN 3.4*    CBC:  Recent Labs Lab 11/22/14 0631  WBC 7.9  HGB 14.4  HCT 45.7  MCV 111.5*  PLT 779*    Cardiac Enzymes:  Recent Labs Lab 11/22/14 0631 11/22/14 1324 11/23/14 0610  TROPONINI <0.03 <0.03 <0.03    Coagulation:  Recent Labs Lab 11/22/14 0631 11/23/14 0610 11/24/14 0631  INR 2.47* 2.36* 1.98*    Radiology: Dg Chest 2 View  11/23/2014   CLINICAL  DATA:  Left-sided pleural effusion with shortness of Breath  EXAM: CHEST  2 VIEW  COMPARISON:  11/22/2014  FINDINGS: Bilateral pleural effusions are again seen left greater than right. The degree of bibasilar opacities has improved in the interval from the prior exam. No new focal infiltrate is seen. No bony abnormality is noted.  IMPRESSION: Bilateral pleural effusions as described. Improved aeration is noted bilaterally.   Electronically Signed   By: Inez Catalina M.D.   On: 11/23/2014  12:23     Medications:   Scheduled Medications: . aspirin EC  81 mg Oral Daily  . diltiazem  300 mg Oral Daily  . furosemide  60 mg Intravenous Q12H  . gabapentin  100 mg Oral TID  . ipratropium-albuterol  3 mL Nebulization Q4H WA  . levothyroxine  75 mcg Oral QAC breakfast  . metoprolol succinate  100 mg Oral BID  . sodium chloride  3 mL Intravenous Q12H  . sodium chloride  3 mL Intravenous Q12H  . Warfarin - Pharmacist Dosing Inpatient   Does not apply Q24H      PRN Medications: sodium chloride, acetaminophen **OR** acetaminophen, alum & mag hydroxide-simeth, hydrALAZINE, HYDROcodone-acetaminophen, ondansetron **OR** ondansetron (ZOFRAN) IV, polyethylene glycol, sodium chloride, traZODone   Assessment and Plan:   1. Atrial fibrillation: Heart rate better cardizem increased yesterday  Coumadin with CHADS VASC Score of 4. INR this am 1.98   2. CHF: She appears to be well-compensated currently, states that she has urinated a lot, and is breathing better. I/0 documentation in unimpressive, wts are higher. Lasix increased to 60 iv bid yesterday . Echo demonstrates normal EF with high pulmonary pressures  No concern for PE as she has been on coumadin.   Would consider left sided thoracentesis   3.Hypertension: Stable.  Jenkins Rouge

## 2014-11-24 NOTE — Progress Notes (Signed)
[place] for Warfarin Indication: atrial fibrillation, Hx DVT  No Known Allergies  Patient Measurements: Height: 6' (182.9 cm) Weight: 180 lb 6.4 oz (81.829 kg) IBW/kg (Calculated) : 73.1  Vital Signs: Temp: 97.8 F (36.6 C) (04/22 0607) Temp Source: Oral (04/22 0607) BP: 106/75 mmHg (04/22 0937) Pulse Rate: 100 (04/22 0937)  Labs:  Recent Labs  11/22/14 0631 11/22/14 1324 11/23/14 0610 11/24/14 0631  HGB 14.4  --   --   --   HCT 45.7  --   --   --   PLT 779*  --   --   --   LABPROT 27.0*  --  26.0* 22.7*  INR 2.47*  --  2.36* 1.98*  CREATININE 0.87  --  0.85 0.97  TROPONINI <0.03 <0.03 <0.03  --     Estimated Creatinine Clearance: 60.5 mL/min (by C-G formula based on Cr of 0.97).   Medical History: Past Medical History  Diagnosis Date  . Hypertension   . Arthritis   . Varicose veins   . Deaf   . Elevated WBC count     and platelets  . Thyroid disease   . Irregular heart rate   . 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 Peripheral neuropathy     feet  . 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 Atrial fibrillation, chronic   . 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 Peripheral vascular disease   . Ulcer of ankle 05/2014    left and right ankle   Medications:  Prescriptions prior to admission  Medication Sig Dispense Refill Last Dose  . aspirin EC 81 MG tablet Take 81 mg by mouth daily.   11/21/2014 at 0500  . Cholecalciferol (VITAMIN D3) 5000 UNITS CAPS Take 1 capsule by mouth daily.   11/21/2014 at 1500  . diltiazem (CARDIZEM CD) 240 MG 24 hr capsule Take 1 capsule (240 mg total) by mouth daily. 90 capsule 3 11/22/2014 at 0500  . gabapentin (NEURONTIN) 100 MG capsule Take 100 mg by mouth 3 (three) times daily.     11/21/2014 at 2000  . levothyroxine (SYNTHROID, LEVOTHROID) 75 MCG tablet    11/21/2014 at 0500  . metoprolol (TOPROL-XL) 100 MG 24 hr tablet Take 100 mg by mouth 2 (two) times daily.     11/22/2014 at 0500  .  warfarin (COUMADIN) 5 MG tablet Take 1 tablet daily except 1/2 tablet on Mondays, Wednesdays and Fridays (Patient taking differently: Take 2.5-5 mg by mouth See admin instructions. Take 1 tablet daily except 1/2 tablet on Mondays, Wednesdays, Fridays and Saturdays.) 30 tablet 6 11/21/2014 at 1800    Assessment: Okay for Protocol, INR below goal and trending down.   Goal of Therapy:  INR 2-3   Plan:  Repeat Warfarin 5mg  PO today. Daily PT/INR.  Hart Robinsons A 11/24/2014,2:11 PM

## 2014-11-25 LAB — PROTIME-INR
INR: 2.07 — ABNORMAL HIGH (ref 0.00–1.49)
Prothrombin Time: 23.5 seconds — ABNORMAL HIGH (ref 11.6–15.2)

## 2014-11-25 LAB — BASIC METABOLIC PANEL
Anion gap: 8 (ref 5–15)
BUN: 15 mg/dL (ref 6–23)
CO2: 25 mmol/L (ref 19–32)
Calcium: 8.6 mg/dL (ref 8.4–10.5)
Chloride: 106 mmol/L (ref 96–112)
Creatinine, Ser: 0.91 mg/dL (ref 0.50–1.10)
GFR calc Af Amer: 71 mL/min — ABNORMAL LOW (ref >90)
GFR calc non Af Amer: 62 mL/min — ABNORMAL LOW (ref >90)
Glucose, Bld: 90 mg/dL (ref 70–99)
Potassium: 3.4 mmol/L — ABNORMAL LOW (ref 3.5–5.1)
SODIUM: 139 mmol/L (ref 135–145)

## 2014-11-25 MED ORDER — WARFARIN SODIUM 5 MG PO TABS
5.0000 mg | ORAL_TABLET | Freq: Once | ORAL | Status: AC
  Administered 2014-11-25: 5 mg via ORAL
  Filled 2014-11-25: qty 1

## 2014-11-25 MED ORDER — POTASSIUM CHLORIDE CRYS ER 20 MEQ PO TBCR
40.0000 meq | EXTENDED_RELEASE_TABLET | Freq: Once | ORAL | Status: AC
  Administered 2014-11-25: 40 meq via ORAL
  Filled 2014-11-25: qty 2

## 2014-11-25 MED ORDER — FUROSEMIDE 10 MG/ML IJ SOLN
80.0000 mg | Freq: Two times a day (BID) | INTRAMUSCULAR | Status: DC
  Administered 2014-11-25 – 2014-11-28 (×6): 80 mg via INTRAVENOUS
  Filled 2014-11-25 (×6): qty 8

## 2014-11-25 MED ORDER — IPRATROPIUM-ALBUTEROL 0.5-2.5 (3) MG/3ML IN SOLN
3.0000 mL | Freq: Three times a day (TID) | RESPIRATORY_TRACT | Status: DC
  Administered 2014-11-25 – 2014-11-27 (×9): 3 mL via RESPIRATORY_TRACT
  Filled 2014-11-25 (×9): qty 3

## 2014-11-25 NOTE — Progress Notes (Signed)
[place] for Warfarin Indication: atrial fibrillation, Hx DVT  No Known Allergies  Patient Measurements: Height: 6' (182.9 cm) Weight: 180 lb 8 oz (81.874 kg) IBW/kg (Calculated) : 73.1  Vital Signs: Temp: 97.9 F (36.6 C) (04/23 0507) Temp Source: Oral (04/23 0507) BP: 131/72 mmHg (04/23 0507) Pulse Rate: 88 (04/23 0507)  Labs:  Recent Labs  11/22/14 1324 11/23/14 0610 11/24/14 0631 11/25/14 0455  LABPROT  --  26.0* 22.7* 23.5*  INR  --  2.36* 1.98* 2.07*  CREATININE  --  0.85 0.97  --   TROPONINI <0.03 <0.03  --   --     Estimated Creatinine Clearance: 60.5 mL/min (by C-G formula based on Cr of 0.97).  Medical History: Past Medical History  Diagnosis Date  . Hypertension   . Arthritis   . Varicose veins   . Deaf   . Elevated WBC count     and platelets  . Thyroid disease   . Irregular heart rate   . 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 Peripheral neuropathy     feet  . 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 Atrial fibrillation, chronic   . 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 Peripheral vascular disease   . Ulcer of ankle 05/2014    left and right ankle   Medications:  Prescriptions prior to admission  Medication Sig Dispense Refill Last Dose  . aspirin EC 81 MG tablet Take 81 mg by mouth daily.   11/21/2014 at 0500  . Cholecalciferol (VITAMIN D3) 5000 UNITS CAPS Take 1 capsule by mouth daily.   11/21/2014 at 1500  . diltiazem (CARDIZEM CD) 240 MG 24 hr capsule Take 1 capsule (240 mg total) by mouth daily. 90 capsule 3 11/22/2014 at 0500  . gabapentin (NEURONTIN) 100 MG capsule Take 100 mg by mouth 3 (three) times daily.     11/21/2014 at 2000  . levothyroxine (SYNTHROID, LEVOTHROID) 75 MCG tablet    11/21/2014 at 0500  . metoprolol (TOPROL-XL) 100 MG 24 hr tablet Take 100 mg by mouth 2 (two) times daily.     11/22/2014 at 0500  . warfarin (COUMADIN) 5 MG tablet Take 1 tablet daily except 1/2 tablet on Mondays,  Wednesdays and Fridays (Patient taking differently: Take 2.5-5 mg by mouth See admin instructions. Take 1 tablet daily except 1/2 tablet on Mondays, Wednesdays, Fridays and Saturdays.) 30 tablet 6 11/21/2014 at 1800    Assessment: Okay for Protocol, INR at goal.    Goal of Therapy:  INR 2-3   Plan:  Repeat Warfarin 5mg  PO today. Daily PT/INR.  [place] Crane, Deloyce Walthers A 11/25/2014,9:35 AM

## 2014-11-25 NOTE — Plan of Care (Signed)
Problem: Phase I Progression Outcomes Goal: OOB as tolerated unless otherwise ordered Outcome: Completed/Met Date Met:  11/25/14 Patient OOB to Chair

## 2014-11-25 NOTE — Progress Notes (Signed)
[facility] PROGRESS NOTE  ROZITA TILMON [id number] DOB: 1941/07/19 DOA: 11/22/2014 PCP: [name], MD  Assessment/Plan: 1. Acute respiratory failure with hypoxia. Related to CHF. Patient is requiring increasing amounts of oxygen. Currently on 5 L and does not appear to be in any distress. We'll continue to try to wean down oxygen as tolerated. Continue to consider thoracentesis if she is unable to wean. 2. Acute diastolic congestive heart failure. Cardiology following. Her weight is minimally changed since admission. Urine output has not been accurately recorded. She is on 60 mg of IV Lasix twice a day. Will increase dose to 80mg  bid. Chest x-ray was repeated on 4/21 and had showed improvement of congestion. Continue current treatments 3. Hypertension. Stable 4. Chronic atrial fibrillation. Rate controlled.  Will hold Coumadin for now in case thoracentesis is needed. 5. Hypothyroidism. TSH normal. Continue Synthroid 6. Bilateral pleural effusions. Related to #2. Continue to current treatments. May need to consider thoracentesis if she does not continue to improve. Will need repeat chest xray in next 24-48 hours. 7. Polycythemia vera. Follow-up with hematology as an outpatient.   Code Status: full code Family Communication: discussed with patient Disposition Plan: discharge home once improved   Consultants:  Cardiology  Procedures:  Echo: - Left ventricle: The cavity size was normal. Wall thickness was normal. Systolic function was normal. The estimated ejection fraction was in the range of 55% to 60%. Wall motion was normal; there were no regional wall motion abnormalities. - Aortic valve: Mildly calcified annulus. Trileaflet; mildly thickened leaflets. Valve area (VTI): 2.15 cm^2. Valve area (Vmax): 2.35 cm^2. - Mitral valve: Mildly calcified annulus. Mildly thickened leaflets . There was mild regurgitation. - Left atrium: The atrium was  severely dilated. - Right ventricle: The cavity size was mildly dilated. - Right atrium: The atrium was severely dilated. - Atrial septum: No defect or patent foramen ovale was identified. - Pulmonary arteries: Systolic pressure was moderately increased. PA peak pressure: 50 mm Hg (S). - Inferior vena cava: The vessel was dilated. The respirophasic diameter changes were blunted (< 50%), consistent with elevated central venous pressure. - Pericardium, extracardiac: There is a large left pleural effusion.  Antibiotics:    HPI/Subjective: Feels that she may be producing more urine now. Feels that breathing is slowly improving. She is still requiring 5L oxygen  Objective: Filed Vitals:   11/25/14 0507  BP: 131/72  Pulse: 88  Temp: 97.9 F (36.6 C)  Resp: 18    Intake/Output Summary (Last 24 hours) at 11/25/14 1336 Last data filed at 11/25/14 0800  Gross per 24 hour  Intake    360 ml  Output   1250 ml  Net   -890 ml   Filed Weights   11/23/14 0458 11/24/14 0607 11/25/14 0507  Weight: 82.237 kg (181 lb 4.8 oz) 81.829 kg (180 lb 6.4 oz) 81.874 kg (180 lb 8 oz)    Exam:   General:  Sitting up in chair, NAD  Cardiovascular: s1, s2, irregular  Respiratory: clear breath sounds bilaterally, diminished at bases L>R, no wheezing  Abdomen: soft, nt, nd, bs+  Musculoskeletal: no edema b/l   Data Reviewed: Basic Metabolic Panel:  Recent Labs Lab 11/22/14 0631 11/23/14 0610 11/24/14 0631 11/25/14 0853  NA 141 140 138 139  K 4.2 3.4* 3.8 3.4*  CL 109 106 104 106  CO2 23 24 24 25   GLUCOSE 109* 98 91 90  BUN 14 15 16 15   CREATININE 0.87 0.85 0.97 0.91  CALCIUM  9.1 8.7 8.8 8.6   Liver Function Tests:  Recent Labs Lab 11/23/14 0610  AST 20  ALT 11  ALKPHOS 72  BILITOT 1.3*  PROT 6.1  ALBUMIN 3.4*   No results for input(s): LIPASE, AMYLASE in the last 168 hours. No results for input(s): AMMONIA in the last 168 hours. CBC:  Recent Labs Lab  11/22/14 0631  WBC 7.9  NEUTROABS 6.3  HGB 14.4  HCT 45.7  MCV 111.5*  PLT 779*   Cardiac Enzymes:  Recent Labs Lab 11/22/14 0631 11/22/14 1324 11/23/14 0610  TROPONINI <0.03 <0.03 <0.03   BNP (last 3 results)  Recent Labs  11/22/14 0631  BNP 456.0*    ProBNP (last 3 results) No results for input(s): PROBNP in the last 8760 hours.  CBG: No results for input(s): GLUCAP in the last 168 hours.  No results found for this or any previous visit (from the past 240 hour(s)).   Studies: No results found.  Scheduled Meds: . aspirin EC  81 mg Oral Daily  . diltiazem  300 mg Oral Daily  . furosemide  80 mg Intravenous Q12H  . gabapentin  100 mg Oral TID  . ipratropium-albuterol  3 mL Nebulization TID  . levothyroxine  75 mcg Oral QAC breakfast  . metoprolol succinate  100 mg Oral BID  . potassium chloride  40 mEq Oral Once  . sodium chloride  3 mL Intravenous Q12H  . sodium chloride  3 mL Intravenous Q12H  . warfarin  5 mg Oral Once  . Warfarin - Pharmacist Dosing Inpatient   Does not apply Q24H   Continuous Infusions:   Principal Problem:   Acute respiratory failure with hypoxia Active Problems:   Polycythemia vera   Chronic atrial fibrillation   HTN (hypertension)   Hypothyroidism   Peripheral vascular disease   Pulmonary edema   Dyspnea   Pleural effusion   Hard of hearing   Thrombocytosis   Acute heart failure   Acute pulmonary edema   Acute diastolic congestive heart failure   Pleural effusion on left    Time spent: 46mins    MEMON,JEHANZEB  [facility] Pager [phone]. If 7PM-7AM, please contact night-coverage at [email or web address], password [facility] 11/25/2014, 1:36 PM  LOS: 3 days

## 2014-11-26 DIAGNOSIS — I10 Essential (primary) hypertension: Secondary | ICD-10-CM

## 2014-11-26 LAB — PROTIME-INR
INR: 2.3 — ABNORMAL HIGH (ref 0.00–1.49)
Prothrombin Time: 25.5 seconds — ABNORMAL HIGH (ref 11.6–15.2)

## 2014-11-26 LAB — BASIC METABOLIC PANEL
Anion gap: 9 (ref 5–15)
BUN: 20 mg/dL (ref 6–23)
CHLORIDE: 105 mmol/L (ref 96–112)
CO2: 26 mmol/L (ref 19–32)
Calcium: 8.9 mg/dL (ref 8.4–10.5)
Creatinine, Ser: 0.93 mg/dL (ref 0.50–1.10)
GFR, EST AFRICAN AMERICAN: 69 mL/min — AB (ref >90)
GFR, EST NON AFRICAN AMERICAN: 60 mL/min — AB (ref >90)
Glucose, Bld: 95 mg/dL (ref 70–99)
Potassium: 4 mmol/L (ref 3.5–5.1)
SODIUM: 140 mmol/L (ref 135–145)

## 2014-11-26 MED ORDER — PHYTONADIONE 5 MG PO TABS
2.5000 mg | ORAL_TABLET | Freq: Once | ORAL | Status: AC
  Administered 2014-11-26: 2.5 mg via ORAL
  Filled 2014-11-26: qty 1

## 2014-11-26 NOTE — Progress Notes (Signed)
[facility] PROGRESS NOTE  Charlotte [name] [id number] DOB: 10/07/1940 DOA: 11/22/2014 PCP: [name], MD  Assessment/Plan: 1. Acute respiratory failure with hypoxia. Related to CHF. Patient is requiring increasing amounts of oxygen. Currently on 5 L and does not appear to be in any distress. Plan to repeat chest x-ray in a.m. Persistent effusions, can consider thoracentesis 2. Acute diastolic congestive heart failure. Cardiology following. Her weight is minimally changed since admission. Urine output has not been accurately recorded. Currently on Lasix 80 mg IV twice a day. Continue current treatments. Repeat chest x-ray in a.m. and consider thoracentesis if necessary. 3. Hypertension. Stable 4. Chronic atrial fibrillation. Rate controlled.  Continue to hold Coumadin. We'll give small dose of vitamin K so that thoracentesis may be done tomorrow if needed.. 5. Hypothyroidism. TSH normal. Continue Synthroid 6. Bilateral pleural effusions. Related to #2. Continue current treatments. May need to consider thoracentesis if she does not continue to improve. Will need repeat chest xray in a.m. 7. Polycythemia vera. Follow-up with hematology as an outpatient.   Code Status: full code Family Communication: discussed with patient Disposition Plan: discharge home once improved   Consultants:  Cardiology  Procedures:  Echo: - Left ventricle: The cavity size was normal. Wall thickness was normal. Systolic function was normal. The estimated ejection fraction was in the range of 55% to 60%. Wall motion was normal; there were no regional wall motion abnormalities. - Aortic valve: Mildly calcified annulus. Trileaflet; mildly thickened leaflets. Valve area (VTI): 2.15 cm^2. Valve area (Vmax): 2.35 cm^2. - Mitral valve: Mildly calcified annulus. Mildly thickened leaflets . There was mild regurgitation. - Left atrium: The atrium was severely dilated. - Right ventricle:  The cavity size was mildly dilated. - Right atrium: The atrium was severely dilated. - Atrial septum: No defect or patent foramen ovale was identified. - Pulmonary arteries: Systolic pressure was moderately increased. PA peak pressure: 50 mm Hg (S). - Inferior vena cava: The vessel was dilated. The respirophasic diameter changes were blunted (< 50%), consistent with elevated central venous pressure. - Pericardium, extracardiac: There is a large left pleural effusion.  Antibiotics:    HPI/Subjective: Feeling better. Wants to walk around. Denies cough or chest pain. Reports significant urine output  Objective: Filed Vitals:   11/26/14 0624  BP: 114/69  Pulse: 89  Temp: 97.4 F (36.3 C)  Resp: 20    Intake/Output Summary (Last 24 hours) at 11/26/14 1029 Last data filed at 11/26/14 [id number]  Gross per 24 hour  Intake    480 ml  Output   1950 ml  Net  -1470 ml   Filed Weights   11/24/14 0607 11/25/14 0507 11/26/14 0624  Weight: 81.829 kg (180 lb 6.4 oz) 81.874 kg (180 lb 8 oz) 82.01 kg (180 lb 12.8 oz)    Exam:   General:  Sitting up in bed NAD  Cardiovascular: s1, s2, irregular  Respiratory: clear breath sounds bilaterally, good air movement, diminished at bases L>R, no wheezing  Abdomen: soft, nt, nd, bs+  Musculoskeletal: trace edema b/l   Data Reviewed: Basic Metabolic Panel:  Recent Labs Lab 11/22/14 0631 11/23/14 0610 11/24/14 0631 11/25/14 0853 11/26/14 0512  NA 141 140 138 139 140  K 4.2 3.4* 3.8 3.4* 4.0  CL 109 106 104 106 105  CO2 23 24 24 25 26   GLUCOSE 109* 98 91 90 95  BUN 14 15 16 15 20   CREATININE 0.87 0.85 0.97 0.91 0.93  CALCIUM 9.1 8.7 8.8 8.6 8.9  Liver Function Tests:  Recent Labs Lab 11/23/14 0610  AST 20  ALT 11  ALKPHOS 72  BILITOT 1.3*  PROT 6.1  ALBUMIN 3.4*   No results for input(s): LIPASE, AMYLASE in the last 168 hours. No results for input(s): AMMONIA in the last 168 hours. CBC:  Recent Labs Lab  11/22/14 0631  WBC 7.9  NEUTROABS 6.3  HGB 14.4  HCT 45.7  MCV 111.5*  PLT 779*   Cardiac Enzymes:  Recent Labs Lab 11/22/14 0631 11/22/14 1324 11/23/14 0610  TROPONINI <0.03 <0.03 <0.03   BNP (last 3 results)  Recent Labs  11/22/14 0631  BNP 456.0*    ProBNP (last 3 results) No results for input(s): PROBNP in the last 8760 hours.  CBG: No results for input(s): GLUCAP in the last 168 hours.  No results found for this or any previous visit (from the past 240 hour(s)).   Studies: No results found.  Scheduled Meds: . aspirin EC  81 mg Oral Daily  . diltiazem  300 mg Oral Daily  . furosemide  80 mg Intravenous Q12H  . gabapentin  100 mg Oral TID  . ipratropium-albuterol  3 mL Nebulization TID  . levothyroxine  75 mcg Oral QAC breakfast  . metoprolol succinate  100 mg Oral BID  . phytonadione  2.5 mg Oral Once  . sodium chloride  3 mL Intravenous Q12H  . sodium chloride  3 mL Intravenous Q12H   Continuous Infusions:   Principal Problem:   Acute respiratory failure with hypoxia Active Problems:   Polycythemia vera   Chronic atrial fibrillation   HTN (hypertension)   Hypothyroidism   Peripheral vascular disease   Pulmonary edema   Dyspnea   Pleural effusion   Hard of hearing   Thrombocytosis   Acute heart failure   Acute pulmonary edema   Acute diastolic congestive heart failure   Pleural effusion on left    Time spent: 57mins    Milania Haubner  [facility] Pager [phone]. If 7PM-7AM, please contact night-coverage at [email or web address], password [facility] 11/26/2014, 10:29 AM  LOS: 4 days

## 2014-11-26 NOTE — Progress Notes (Signed)
Patient ambulated in the hall with little assist from the nurse tech

## 2014-11-26 NOTE — Progress Notes (Signed)
Patient walked around nursing station twice with out oxygen with nursing aide following and showed little to no sign of hypoxia. Her saturation on oxygen still reading low. Will most likely need a abg to determine true blood oxygen level. She had a PFT in the past.

## 2014-11-27 ENCOUNTER — Inpatient Hospital Stay (HOSPITAL_COMMUNITY): Payer: Medicare Other

## 2014-11-27 DIAGNOSIS — I5033 Acute on chronic diastolic (congestive) heart failure: Principal | ICD-10-CM

## 2014-11-27 DIAGNOSIS — J90 Pleural effusion, not elsewhere classified: Secondary | ICD-10-CM

## 2014-11-27 LAB — BASIC METABOLIC PANEL
ANION GAP: 9 (ref 5–15)
BUN: 22 mg/dL (ref 6–23)
CO2: 25 mmol/L (ref 19–32)
Calcium: 9.1 mg/dL (ref 8.4–10.5)
Chloride: 105 mmol/L (ref 96–112)
Creatinine, Ser: 0.93 mg/dL (ref 0.50–1.10)
GFR calc non Af Amer: 60 mL/min — ABNORMAL LOW (ref >90)
GFR, EST AFRICAN AMERICAN: 69 mL/min — AB (ref >90)
Glucose, Bld: 100 mg/dL — ABNORMAL HIGH (ref 70–99)
Potassium: 3.9 mmol/L (ref 3.5–5.1)
Sodium: 139 mmol/L (ref 135–145)

## 2014-11-27 LAB — CBC
HCT: 47.6 % — ABNORMAL HIGH (ref 36.0–46.0)
Hemoglobin: 15.5 g/dL — ABNORMAL HIGH (ref 12.0–15.0)
MCH: 34.4 pg — ABNORMAL HIGH (ref 26.0–34.0)
MCHC: 32.6 g/dL (ref 30.0–36.0)
MCV: 105.8 fL — ABNORMAL HIGH (ref 78.0–100.0)
Platelets: 932 10*3/uL (ref 150–400)
RBC: 4.5 MIL/uL (ref 3.87–5.11)
RDW: 26.4 % — AB (ref 11.5–15.5)
WBC: 10.7 10*3/uL — ABNORMAL HIGH (ref 4.0–10.5)

## 2014-11-27 LAB — BLOOD GAS, ARTERIAL
Acid-Base Excess: 0.3 mmol/L (ref 0.0–2.0)
Bicarbonate: 23.6 mEq/L (ref 20.0–24.0)
Drawn by: 27407
FIO2: 0.21 %
O2 SAT: 84.8 %
PCO2 ART: 32.5 mmHg — AB (ref 35.0–45.0)
PO2 ART: 49.6 mmHg — AB (ref 80.0–100.0)
Patient temperature: 37
TCO2: 20.3 mmol/L (ref 0–100)
pH, Arterial: 7.474 — ABNORMAL HIGH (ref 7.350–7.450)

## 2014-11-27 LAB — PROTIME-INR
INR: 1.59 — AB (ref 0.00–1.49)
Prothrombin Time: 19.1 seconds — ABNORMAL HIGH (ref 11.6–15.2)

## 2014-11-27 MED ORDER — WARFARIN - PHARMACIST DOSING INPATIENT
Status: DC
  Administered 2014-11-27: 16:00:00

## 2014-11-27 MED ORDER — WARFARIN SODIUM 5 MG PO TABS
5.0000 mg | ORAL_TABLET | Freq: Once | ORAL | Status: AC
  Administered 2014-11-27: 5 mg via ORAL
  Filled 2014-11-27: qty 1

## 2014-11-27 NOTE — Progress Notes (Signed)
Charlotte [name] for Warfarin Indication: atrial fibrillation, Hx DVT  No Known Allergies  Patient Measurements: Height: 6' (182.9 cm) Weight: 177 lb 8 oz (80.513 kg) IBW/kg (Calculated) : 73.1  Vital Signs: Temp: 97.8 F (36.6 C) (04/25 0651) Temp Source: Oral (04/25 0651) BP: 119/69 mmHg (04/25 0651) Pulse Rate: 87 (04/25 0651)  Labs:  Recent Labs  11/25/14 0455 11/25/14 0853 11/26/14 0512 11/27/14 0620  HGB  --   --   --  15.5*  HCT  --   --   --  47.6*  PLT  --   --   --  932*  LABPROT 23.5*  --  25.5* 19.1*  INR 2.07*  --  2.30* 1.59*  CREATININE  --  0.91 0.93 0.93    Estimated Creatinine Clearance: 63.1 mL/min (by C-G formula based on Cr of 0.93).  Medical History: Past Medical History  Diagnosis Date  . Hypertension   . Arthritis   . Varicose veins   . Deaf   . Elevated WBC count     and platelets  . Thyroid disease   . Irregular heart rate   . 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 Peripheral neuropathy     feet  . 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 Atrial fibrillation, chronic   . 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 Peripheral vascular disease   . Ulcer of ankle 05/2014    left and right ankle   Medications:  Prescriptions prior to admission  Medication Sig Dispense Refill Last Dose  . aspirin EC 81 MG tablet Take 81 mg by mouth daily.   11/21/2014 at 0500  . Cholecalciferol (VITAMIN D3) 5000 UNITS CAPS Take 1 capsule by mouth daily.   11/21/2014 at 1500  . diltiazem (CARDIZEM CD) 240 MG 24 hr capsule Take 1 capsule (240 mg total) by mouth daily. 90 capsule 3 11/22/2014 at 0500  . gabapentin (NEURONTIN) 100 MG capsule Take 100 mg by mouth 3 (three) times daily.     11/21/2014 at 2000  . levothyroxine (SYNTHROID, LEVOTHROID) 75 MCG tablet    11/21/2014 at 0500  . metoprolol (TOPROL-XL) 100 MG 24 hr tablet Take 100 mg by mouth 2 (two) times daily.     11/22/2014 at 0500  . warfarin (COUMADIN) 5 MG tablet Take 1  tablet daily except 1/2 tablet on Mondays, Wednesdays and Fridays (Patient taking differently: Take 2.5-5 mg by mouth See admin instructions. Take 1 tablet daily except 1/2 tablet on Mondays, Wednesdays, Fridays and Saturdays.) 30 tablet 6 11/21/2014 at 1800   Assessment: Okay for Protocol, INR at goal on admission. Coumadin was held and pt was given Vitamin K 2.5mg  yesterday in anticipation of thoracentesis but plans have changed, thoracentesis likely not needed.  Asked to resume Coumadin Rx.  INR is below goal today due to Vit K. Records from Coumadin clinic re: Coumadin dosing regimen noted below:  Anticoagulation Monitoring 11/01/2014  INR goal 2.0-3.0  Assoc. INR Date 11/01/2014  Associated INR 1.9  Pt. deviation No  Current weekly dose 25 mg  Sunday dose 5 mg  Monday dose 2.5 mg  Tuesday dose 5 mg  Wednesday dose 2.5 mg  Thursday dose 5 mg  Friday dose 2.5 mg  Saturday dose 2.5 mg  Weekly dose 25 mg  Dose description Take coumadin 1 tablet tonight then resume 1/2  tablet daily except 1 tablet on Sundays, Tuesdays and Thursdays  Return date 11/22/2014   Goal of Therapy:  INR 2-3  Plan:  Warfarin 5mg  PO today to boost INR and attempt to overcome Vit K effect Daily PT/INR.  [place] Crane, Ethyle Tiedt A 11/27/2014,1:15 PM

## 2014-11-27 NOTE — Progress Notes (Signed)
Notified Dr. Roderic Palau notified that the patient is currently on the O2 sat machine that is at the bedside in the room, not the O2 sat monitored by CMT.  The sats on room air was 82% room air so the patient placed on 2 LPM.  The patient appeared to be stable she had no complaints of discomfort or SOB.  On the 2 LPM via n/c the patient seemed to range from 90-94% with occassional drops in the 80's.  MD states to leave the O2 machine on with the probe on her forehead to monitor the patient throughout to night he asked me to pass on in report to leave the patient at 2 lpm as long as the patient is clinically stable.  I verbalized understanding and will continue to monitor the patient.

## 2014-11-27 NOTE — Progress Notes (Signed)
Platelets [phone].  Dr. Roderic Palau Notified.

## 2014-11-27 NOTE — Progress Notes (Signed)
[facility] PROGRESS NOTE  Charlotte [name] [id number] DOB: 05-22-1941 DOA: 11/22/2014 PCP: [name], MD  Assessment/Plan:  Acute respiratory failure with hypoxia. Likely related to CHF but some concern pleural effusion playing part. Continues to require 4-5L oxygen. Not on oxygen at home. Reportedly ambulated in hall without oxygen without hypoxia.  Repeat chest x-ray reviewed and shows slight improvement bilateral pleural effusions. Not completely sure oxygen saturation data accurate. Will obtain a room air ABG  Acute diastolic congestive heart failure.  Her weight is 80.5kg from 82.0kg yesterday. Urine output has not been accurately recorded. Evaluated by cardiology who recommend continuing.  Lasix 80 mg IV twice a day.   Hypertension. Remains stable  Chronic atrial fibrillation. Rate controlled.INR sub therapeutic. Have been holding coumadin in anticipation of possible thoracentesis. Will resume coumadin as not likely needing thoracentiesis. ..  Hypothyroidism. TSH normal. Continue Synthroid  Bilateral pleural effusions. Related to #2. See #1.  Continue current treatments.   Polycythemia vera. Follow-up with hematology as an outpatient.   Code Status: full Family Communication: none present Disposition Plan: home when ready hopefully 24-48 hours   Consultants:  cardiology  Procedures:  Echo: - Left ventricle: The cavity size was normal. Wall thickness was normal. Systolic function was normal. The estimated ejection fraction was in the range of 55% to 60%. Wall motion was normal; there were no regional wall motion abnormalities. - Aortic valve: Mildly calcified annulus. Trileaflet; mildly thickened leaflets. Valve area (VTI): 2.15 cm^2. Valve area (Vmax): 2.35 cm^2. - Mitral valve: Mildly calcified annulus. Mildly thickened leaflets . There was mild regurgitation. - Left atrium: The atrium was severely dilated. - Right ventricle: The  cavity size was mildly dilated. - Right atrium: The atrium was severely dilated. - Atrial septum: No defect or patent foramen ovale was identified. - Pulmonary arteries: Systolic pressure was moderately increased. PA peak pressure: 50 mm Hg (S). - Inferior vena cava: The vessel was dilated. The respirophasic diameter changes were blunted (< 50%), consistent with elevated central venous pressure. - Pericardium, extracardiac: There is a large left pleural  Antibiotics: none  HPI/Subjective: Sitting up in chair smiling. Denies pain/discomfort or shortness of breath  Objective: Filed Vitals:   11/27/14 0651  BP: 119/69  Pulse: 87  Temp: 97.8 F (36.6 C)  Resp: 18    Intake/Output Summary (Last 24 hours) at 11/27/14 1147 Last data filed at 11/27/14 0657  Gross per 24 hour  Intake    720 ml  Output   2150 ml  Net  -1430 ml   Filed Weights   11/25/14 0507 11/26/14 0624 11/27/14 0651  Weight: 81.874 kg (180 lb 8 oz) 82.01 kg (180 lb 12.8 oz) 80.513 kg (177 lb 8 oz)    Exam:   General:  Well nourished appears well  Cardiovascular: irregularly irregular no MGR no LE edema  Respiratory: normal effort BS diminished in bases but otherwise clear to auscultation  Abdomen: flat soft +BS non-tender to palpation  Musculoskeletal: no clubbing or cyanosis   Data Reviewed: Basic Metabolic Panel:  Recent Labs Lab 11/23/14 0610 11/24/14 0631 11/25/14 0853 11/26/14 0512 11/27/14 0620  NA 140 138 139 140 139  K 3.4* 3.8 3.4* 4.0 3.9  CL 106 104 106 105 105  CO2 24 24 25 26 25   GLUCOSE 98 91 90 95 100*  BUN 15 16 15 20 22   CREATININE 0.85 0.97 0.91 0.93 0.93  CALCIUM 8.7 8.8 8.6 8.9 9.1   Liver Function Tests:  Recent  Labs Lab 11/23/14 0610  AST 20  ALT 11  ALKPHOS 72  BILITOT 1.3*  PROT 6.1  ALBUMIN 3.4*   No results for input(s): LIPASE, AMYLASE in the last 168 hours. No results for input(s): AMMONIA in the last 168 hours. CBC:  Recent Labs Lab  11/22/14 0631 11/27/14 0620  WBC 7.9 10.7*  NEUTROABS 6.3  --   HGB 14.4 15.5*  HCT 45.7 47.6*  MCV 111.5* 105.8*  PLT 779* 932*   Cardiac Enzymes:  Recent Labs Lab 11/22/14 0631 11/22/14 1324 11/23/14 0610  TROPONINI <0.03 <0.03 <0.03   BNP (last 3 results)  Recent Labs  11/22/14 0631  BNP 456.0*    ProBNP (last 3 results) No results for input(s): PROBNP in the last 8760 hours.  CBG: No results for input(s): GLUCAP in the last 168 hours.  No results found for this or any previous visit (from the past 240 hour(s)).   Studies: Dg Chest 2 View  11/27/2014   CLINICAL DATA:  Shortness of breath.  EXAM: CHEST  2 VIEW  COMPARISON:  11/23/2014, 11/22/2014.  FINDINGS: Mediastinum and hilar structures normal. Cardiomegaly with mild pulmonary vascular prominence and diffuse interstitial prominence with bilateral pleural effusions. These findings consistent congestive heart failure. No acute bony abnormality .  IMPRESSION: Cardiomegaly with pulmonary vascular prominence, bilateral interstitial prominence bilateral pleural effusions consistent with congestive heart failure. Pulmonary interstitial prominence has increased slightly from prior exams   Electronically Signed   By: Kelly   On: 11/27/2014 09:33    Scheduled Meds: . aspirin EC  81 mg Oral Daily  . diltiazem  300 mg Oral Daily  . furosemide  80 mg Intravenous Q12H  . gabapentin  100 mg Oral TID  . ipratropium-albuterol  3 mL Nebulization TID  . levothyroxine  75 mcg Oral QAC breakfast  . metoprolol succinate  100 mg Oral BID  . sodium chloride  3 mL Intravenous Q12H  . sodium chloride  3 mL Intravenous Q12H   Continuous Infusions:   Principal Problem:   Acute respiratory failure with hypoxia Active Problems:   Polycythemia vera   Chronic atrial fibrillation   HTN (hypertension)   Hypothyroidism   Peripheral vascular disease   Pulmonary edema   Dyspnea   Pleural effusion   Hard of hearing    Thrombocytosis   Acute heart failure   Acute pulmonary edema   Acute diastolic congestive heart failure   Pleural effusion on left    Time spent: 30 minutes    [facility] Pager [phone]. If 7PM-7AM, please contact night-coverage at [email or web address], password [facility] 11/27/2014, 11:47 AM  LOS: 5 days

## 2014-11-27 NOTE — Plan of Care (Signed)
Problem: Phase II Progression Outcomes Goal: Progress activity as tolerated unless otherwise ordered Outcome: Progressing Patient ambulates to the bathroom, and has ambulated out into the hall.

## 2014-11-27 NOTE — Progress Notes (Signed)
Primary Cardiologist: Kate Sable MD  Cardiology Specific Problem List: 1. Atrial fibrillation 2. Hypertension  3. Diastolic CHF  Subjective:    Breathing better, no chest pain   Objective:   Temp:  [97.5 F (36.4 C)-97.8 F (36.6 C)] 97.8 F (36.6 C) (04/25 0651) Pulse Rate:  [66-87] 87 (04/25 0651) Resp:  [18] 18 (04/25 0651) BP: (109-119)/(69-74) 119/69 mmHg (04/25 0651) SpO2:  [90 %-97 %] 94 % (04/25 0809) Weight:  [177 lb 8 oz (80.513 kg)] 177 lb 8 oz (80.513 kg) (04/25 0651) Last BM Date: 11/25/14  Filed Weights   11/25/14 0507 11/26/14 0624 11/27/14 0651  Weight: 180 lb 8 oz (81.874 kg) 180 lb 12.8 oz (82.01 kg) 177 lb 8 oz (80.513 kg)    Intake/Output Summary (Last 24 hours) at 11/27/14 1015 Last data filed at 11/27/14 0657  Gross per 24 hour  Intake    720 ml  Output   2150 ml  Net  -1430 ml   Echocardiogram: 11/22/2014 Left ventricle: The cavity size was normal. Wall thickness was normal. Systolic function was normal. The estimated ejection fraction was in the range of 55% to 60%. Wall motion was normal; there were no regional wall motion abnormalities. - Aortic valve: Mildly calcified annulus. Trileaflet; mildly thickened leaflets. Valve area (VTI): 2.15 cm^2. Valve area (Vmax): 2.35 cm^2. - Mitral valve: Mildly calcified annulus. Mildly thickened leaflets . There was mild regurgitation. - Left atrium: The atrium was severely dilated. - Right ventricle: The cavity size was mildly dilated. - Right atrium: The atrium was severely dilated. - Atrial septum: No defect or patent foramen ovale was identified. - Pulmonary arteries: Systolic pressure was moderately increased. PA peak pressure: 50 mm Hg (S). - Inferior vena cava: The vessel was dilated. The respirophasic diameter changes were blunted (< 50%), consistent with elevated central venous pressure. - Pericardium, extracardiac: There is a large left pleural effusion. -  Technically adequate study.  Telemetry: Atrial fibrillation rates in the 80's.   Exam:  General: No acute distress.  HEENT: Conjunctiva and lids normal, oropharynx clear.  Lungs: Clear to auscultation, diminished n the bases without wheezes or rhonchi. .  Cardiac: No elevated JVP or bruits. IRRR, no gallop or rub.   Extremities: No pitting edema, distal pulses full.Woody skin thickening in the lower extremities.   Neuropsychiatric: Alert and oriented x3, affect appropriate. Hard of hearing.    Lab Results:  Basic Metabolic Panel:  Recent Labs Lab 11/25/14 0853 11/26/14 0512 11/27/14 0620  NA 139 140 139  K 3.4* 4.0 3.9  CL 106 105 105  CO2 25 26 25   GLUCOSE 90 95 100*  BUN 15 20 22   CREATININE 0.91 0.93 0.93  CALCIUM 8.6 8.9 9.1    CBC:  Recent Labs Lab 11/22/14 0631 11/27/14 0620  WBC 7.9 10.7*  HGB 14.4 15.5*  HCT 45.7 47.6*  MCV 111.5* 105.8*  PLT 779* 932*    Cardiac Enzymes:  Recent Labs Lab 11/22/14 0631 11/22/14 1324 11/23/14 0610  TROPONINI <0.03 <0.03 <0.03    Coagulation:  Recent Labs Lab 11/25/14 0455 11/26/14 0512 11/27/14 0620  INR 2.07* 2.30* 1.59*     Medications:   Scheduled Medications: . aspirin EC  81 mg Oral Daily  . diltiazem  300 mg Oral Daily  . furosemide  80 mg Intravenous Q12H  . gabapentin  100 mg Oral TID  . ipratropium-albuterol  3 mL Nebulization TID  . levothyroxine  75 mcg Oral QAC breakfast  .  metoprolol succinate  100 mg Oral BID  . sodium chloride  3 mL Intravenous Q12H  . sodium chloride  3 mL Intravenous Q12H    PRN Medications: sodium chloride, acetaminophen **OR** acetaminophen, alum & mag hydroxide-simeth, hydrALAZINE, HYDROcodone-acetaminophen, ondansetron **OR** ondansetron (ZOFRAN) IV, polyethylene glycol, sodium chloride, traZODone   Assessment and Plan:   1. Atrial fibrillation: Has been off coumadin in anticipation of possible thoracentesis. INR this am 1.59. Rate is controlled, on  metoprolol succinate 100 mg BID and Cardizem CD 300 mg daily. CHADSVASC score of 4.   2. Acute on chronic diastolic CHF: Weight down 4 pounds from admission. Between 2 and 3 liters diuresed since admission. She remains on lasix 80 mg Q 12 hours. Creatinine 0.93, CO2 25 this am. She does not appear to be volume overloaded on exam. Chest x-ray shows improving bilateral pleural effusions.   3. Hypertension: BP is well controlled.   Charlotte [name]. Lawrence NP [place]  11/27/2014, 10:15 AM    Attending note:  Patient seen and examined. Patient seen and examined. Reviewed hospital course and modified above note by Ms. Lawrence NP. Ms. Tardie reports improved shortness of breath, still has oxygen requirement. Her weight is down 4 pounds from admission and she has diuresed between 2-3 L on IV Lasix. On examination she is comfortable, lungs exhibit decreased breath sounds at the bases, cardiac exam with irregularly irregular rhythm. Her chest x-ray does show improving bilateral pleural effusions, left greater than right. Not entirely clear that therapeutic thoracentesis would make a tremendous difference in her symptoms at this time or necessarily even her oxygen status. Would probably continue IV diuresis further. Otherwise heart rate is controlled in atrial fibrillation.   Satira Sark, M.D., F.A.C.C.

## 2014-11-28 LAB — CBC
HEMATOCRIT: 45.3 % (ref 36.0–46.0)
HEMOGLOBIN: 14.1 g/dL (ref 12.0–15.0)
MCH: 32.9 pg (ref 26.0–34.0)
MCHC: 31.1 g/dL (ref 30.0–36.0)
MCV: 105.8 fL — ABNORMAL HIGH (ref 78.0–100.0)
PLATELETS: 852 10*3/uL — AB (ref 150–400)
RBC: 4.28 MIL/uL (ref 3.87–5.11)
RDW: 26.9 % — ABNORMAL HIGH (ref 11.5–15.5)
WBC: 10.3 10*3/uL (ref 4.0–10.5)

## 2014-11-28 LAB — BASIC METABOLIC PANEL
ANION GAP: 11 (ref 5–15)
BUN: 23 mg/dL (ref 6–23)
CO2: 25 mmol/L (ref 19–32)
Calcium: 8.7 mg/dL (ref 8.4–10.5)
Chloride: 104 mmol/L (ref 96–112)
Creatinine, Ser: 0.97 mg/dL (ref 0.50–1.10)
GFR calc Af Amer: 66 mL/min — ABNORMAL LOW (ref >90)
GFR calc non Af Amer: 57 mL/min — ABNORMAL LOW (ref >90)
Glucose, Bld: 100 mg/dL — ABNORMAL HIGH (ref 70–99)
POTASSIUM: 3.2 mmol/L — AB (ref 3.5–5.1)
SODIUM: 140 mmol/L (ref 135–145)

## 2014-11-28 LAB — PROTIME-INR
INR: 1.38 (ref 0.00–1.49)
PROTHROMBIN TIME: 17.1 s — AB (ref 11.6–15.2)

## 2014-11-28 MED ORDER — METOLAZONE 5 MG PO TABS
2.5000 mg | ORAL_TABLET | Freq: Once | ORAL | Status: AC
  Administered 2014-11-28: 2.5 mg via ORAL
  Filled 2014-11-28: qty 1

## 2014-11-28 MED ORDER — FUROSEMIDE 40 MG PO TABS: 40.0000 mg | ORAL_TABLET | Freq: Every day | ORAL | Status: DC

## 2014-11-28 MED ORDER — WARFARIN SODIUM 5 MG PO TABS
5.0000 mg | ORAL_TABLET | Freq: Once | ORAL | Status: AC
  Administered 2014-11-28: 5 mg via ORAL
  Filled 2014-11-28: qty 1

## 2014-11-28 MED ORDER — TORSEMIDE 20 MG PO TABS: 20.0000 mg | ORAL_TABLET | Freq: Every day | ORAL | Status: DC

## 2014-11-28 MED ORDER — TORSEMIDE 20 MG PO TABS
20.0000 mg | ORAL_TABLET | Freq: Every day | ORAL | Status: DC
  Administered 2014-11-28 – 2014-11-29 (×2): 20 mg via ORAL
  Filled 2014-11-28 (×2): qty 1

## 2014-11-28 MED ORDER — IPRATROPIUM-ALBUTEROL 0.5-2.5 (3) MG/3ML IN SOLN
3.0000 mL | Freq: Two times a day (BID) | RESPIRATORY_TRACT | Status: DC
  Administered 2014-11-28 – 2014-11-29 (×3): 3 mL via RESPIRATORY_TRACT
  Filled 2014-11-28 (×3): qty 3

## 2014-11-28 MED ORDER — POTASSIUM CHLORIDE CRYS ER 20 MEQ PO TBCR
40.0000 meq | EXTENDED_RELEASE_TABLET | Freq: Once | ORAL | Status: AC
  Administered 2014-11-28: 40 meq via ORAL
  Filled 2014-11-28: qty 2

## 2014-11-28 MED ORDER — DILTIAZEM HCL ER COATED BEADS 300 MG PO CP24: 300.0000 mg | ORAL_CAPSULE | Freq: Every day | ORAL | Status: DC

## 2014-11-28 NOTE — Progress Notes (Signed)
Patient up sitting in the chair tolerated well. No shortness of breathe noted when patient ambulated in hallway earlier.

## 2014-11-28 NOTE — Progress Notes (Signed)
[place] for Warfarin Indication: atrial fibrillation, Hx DVT  No Known Allergies  Patient Measurements: Height: 6' (182.9 cm) Weight: 178 lb 8 oz (80.967 kg) IBW/kg (Calculated) : 73.1  Vital Signs: Temp: 97.7 F (36.5 C) (04/26 0537) Temp Source: Oral (04/26 0537) BP: 123/74 mmHg (04/26 0537) Pulse Rate: 80 (04/26 0537)  Labs:  Recent Labs  11/26/14 0512 11/27/14 0620 11/28/14 0644  HGB  --  15.5* 14.1  HCT  --  47.6* 45.3  PLT  --  932* 852*  LABPROT 25.5* 19.1* 17.1*  INR 2.30* 1.59* 1.38  CREATININE 0.93 0.93 0.97   Estimated Creatinine Clearance: 60.5 mL/min (by C-G formula based on Cr of 0.97).  Medical History: Past Medical History  Diagnosis Date  . Hypertension   . Arthritis   . Varicose veins   . Deaf   . Elevated WBC count     and platelets  . Thyroid disease   . Irregular heart rate   . 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 Peripheral neuropathy     feet  . 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 Atrial fibrillation, chronic   . 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 Peripheral vascular disease   . Ulcer of ankle 05/2014    left and right ankle   Medications:  Prescriptions prior to admission  Medication Sig Dispense Refill Last Dose  . aspirin EC 81 MG tablet Take 81 mg by mouth daily.   11/21/2014 at 0500  . Cholecalciferol (VITAMIN D3) 5000 UNITS CAPS Take 1 capsule by mouth daily.   11/21/2014 at 1500  . diltiazem (CARDIZEM CD) 240 MG 24 hr capsule Take 1 capsule (240 mg total) by mouth daily. 90 capsule 3 11/22/2014 at 0500  . gabapentin (NEURONTIN) 100 MG capsule Take 100 mg by mouth 3 (three) times daily.     11/21/2014 at 2000  . levothyroxine (SYNTHROID, LEVOTHROID) 75 MCG tablet    11/21/2014 at 0500  . metoprolol (TOPROL-XL) 100 MG 24 hr tablet Take 100 mg by mouth 2 (two) times daily.     11/22/2014 at 0500  . warfarin (COUMADIN) 5 MG tablet Take 1 tablet daily except 1/2 tablet on Mondays,  Wednesdays and Fridays (Patient taking differently: Take 2.5-5 mg by mouth See admin instructions. Take 1 tablet daily except 1/2 tablet on Mondays, Wednesdays, Fridays and Saturdays.) 30 tablet 6 11/21/2014 at 1800   Assessment: Okay for Protocol, INR at goal on admission. Coumadin was held and pt was given Vitamin K 2.5mg  yesterday in anticipation of thoracentesis but plans have changed, thoracentesis likely not needed.  Asked to resume Coumadin Rx.  INR is below goal today due to Vit K. Records from Coumadin clinic re: Coumadin dosing regimen noted below:  Anticoagulation Monitoring 11/01/2014  INR goal 2.0-3.0  Assoc. INR Date 11/01/2014  Associated INR 1.9  Pt. deviation No  Current weekly dose 25 mg  Sunday dose 5 mg  Monday dose 2.5 mg  Tuesday dose 5 mg  Wednesday dose 2.5 mg  Thursday dose 5 mg  Friday dose 2.5 mg  Saturday dose 2.5 mg  Weekly dose 25 mg  Dose description Take coumadin 1 tablet tonight then resume 1/2  tablet daily except 1 tablet on Sundays, Tuesdays and Thursdays  Return date 11/22/2014   Goal of Therapy:  INR 2-3   Plan:  Warfarin 5mg  PO today (per home regimen) Daily PT/INR.  [place] Crane, [place] A 11/28/2014,10:57 AM

## 2014-11-28 NOTE — Discharge Summary (Signed)
Physician Discharge Summary  Charlotte [name] [id number] DOB: 14-Feb-1941 DOA: 11/22/2014  PCP: [name], MD  Admit date: 11/22/2014 Discharge date: 11/28/2014  Time spent: 40 minutes  Recommendations for Outpatient Follow-up:  1. PCP 1 week for evaluation of respiratory status and continued need for oxygen. Recommend BMET to assess electrolytes 2. Follow up with Jory Sims to assess afb and HTN control  3. Home oxygen ordered  Discharge Diagnoses:  Principal Problem:   Acute respiratory failure with hypoxia Active Problems:   Polycythemia vera   Chronic atrial fibrillation   HTN (hypertension)   Hypothyroidism   Peripheral vascular disease   Pulmonary edema   Dyspnea   Pleural effusion   Hard of hearing   Thrombocytosis   Acute heart failure   Acute pulmonary edema   Acute diastolic congestive heart failure   Pleural effusion on left   Discharge Condition: stable  Diet recommendation: heart healthy  Filed Weights   11/26/14 0624 11/27/14 0651 11/28/14 0537  Weight: 82.01 kg (180 lb 12.8 oz) 80.513 kg (177 lb 8 oz) 80.967 kg (178 lb 8 oz)    History of present illness:  Charlotte [name] a very pleasant 74 y.o. female with a past medical history that includes A. fib on Coumadin, hypertension, PVD, peripheral vascular disease, DVT presented to the emergency department on 11/22/14 with chief complaint of shortness of breath. Initial evaluation revealed pulmonary edema and small pleural effusion per chest x-ray and an elevated BNP of 456.  Patient reported she had been developing gradual worsening shortness of breath for the previous 2 days. Night prior however she was unable to sleep or lie flat. She denied chest pain palpitations diaphoresis nausea vomiting headache visual disturbances numbness or tingling of extremities. She denied lower extremity edema dysuria hematuria frequency or urgency. She denied fever chills or recent sick contacts. Reported  compliance with all of her medications and physician visits. Workup in the emergency department included basic metabolic panel that was unremarkable, complete blood count significant for platelets of 779. Initial troponin is negative, BNP 456, INR 2.47, chest x-ray as noted above, EKG with A. Fib. She was afebrile blood pressure trending towards the higher end of normal heart rate 87 she was not hypoxic. In the emergency department she was provided with Lasix 40 mg IV at the time of my exam respiratory status much improved.  Hospital Course:   Acute respiratory failure with hypoxia. Related to CHF in setting of  pleural effusion. Provided with IV lasix and diuresed between 2-3 liters.  Continued to require oxygen to keep sats >90%. Repeat chest x-ray on 11/27/14 reviewed and showed slight improvement bilateral pleural effusions. Evaluated by cardiology who opined hypoxia not likely related to pleural effusions.  ABG on room air with pO2 49.6 and CO2 32.5. At discharge sats 91-99% on 2L. Will discharge with home oxygen. Follow up with PCP in 1 week to evaluation continued need for home oxygen. Also has appointment with cardiology scheduled 12/11/14.  Acute diastolic congestive heart failure.IV lasix given.  Her weight is 80.kg at discharge, down  from 82.0kg.  Evaluated by cardiology who recommend demadex 20mg  BID at discharge.    Hypertension. Remained stable  Chronic atrial fibrillation. Rate controlled on dilt and metoprolol.INR 1.3 at discharge. Coumadin held for awhile adn Vit K given in anticipation of possible thoracentesis. 5mg  coumadin given on day of discharge and home regimen resumed. Has appointment for INR check 12/04/14.  Hypothyroidism. TSH normal. Continue Synthroid  Bilateral  pleural effusions. Related to #2. See #1.   Polycythemia vera. Follow-up with hematology as an outpatient as schedule  Procedures:  Echo: - Left ventricle: The cavity size was normal. Wall thickness  was normal. Systolic function was normal. The estimated ejection fraction was in the range of 55% to 60%. Wall motion was normal; there were no regional wall motion abnormalities. - Aortic valve: Mildly calcified annulus. Trileaflet; mildly thickened leaflets. Valve area (VTI): 2.15 cm^2. Valve area (Vmax): 2.35 cm^2. - Mitral valve: Mildly calcified annulus. Mildly thickened leaflets . There was mild regurgitation. - Left atrium: The atrium was severely dilated. - Right ventricle: The cavity size was mildly dilated. - Right atrium: The atrium was severely dilated. - Atrial septum: No defect or patent foramen ovale was identified. - Pulmonary arteries: Systolic pressure was moderately increased. PA peak pressure: 50 mm Hg (S). - Inferior vena cava: The vessel was dilated. The respirophasic diameter changes were blunted (< 50%), consistent with elevated central venous pressure. - Pericardium, extracardiac: There is a large left pleural  Consultations:  cardiology  Discharge Exam: Filed Vitals:   11/28/14 0537  BP: 123/74  Pulse: 80  Temp: 97.7 F (36.5 C)  Resp: 18    General: well nourished appears well Cardiovascular: irregularly irregular no MGR No LE edema Respiratory: normal effort BS fine crackles bases no wheeze  Discharge Instructions    Current Discharge Medication List    START taking these medications   Details  torsemide (DEMADEX) 20 MG tablet Take 1 tablet (20 mg total) by mouth daily. Qty: 60 tablet, Refills: 1      CONTINUE these medications which have CHANGED   Details  diltiazem (CARDIZEM CD) 300 MG 24 hr capsule Take 1 capsule (300 mg total) by mouth daily. Qty: 30 capsule, Refills: 1      CONTINUE these medications which have NOT CHANGED   Details  aspirin EC 81 MG tablet Take 81 mg by mouth daily.    Cholecalciferol (VITAMIN D3) 5000 UNITS CAPS Take 1 capsule by mouth daily.    gabapentin (NEURONTIN) 100 MG capsule  Take 100 mg by mouth 3 (three) times daily.      levothyroxine (SYNTHROID, LEVOTHROID) 75 MCG tablet    Associated Diagnoses: Polycythemia vera    metoprolol (TOPROL-XL) 100 MG 24 hr tablet Take 100 mg by mouth 2 (two) times daily.      warfarin (COUMADIN) 5 MG tablet Take 1 tablet daily except 1/2 tablet on Mondays, Wednesdays and Fridays Qty: 30 tablet, Refills: 6       No Known Allergies Follow-up Information    Follow up with Jory Sims, NP On 12/11/2014.   Specialty:  Nurse Practitioner   Why:  3:10 pm   Contact information:   [place] [phone]       Follow up On 12/04/2014.   Why:  9:50 am coumadin clinic       Follow up with [name], MD In 1 week.   Specialty:  Family Medicine   Why:  evaluate respiratory status and need for continuous oxygen.    Contact information:   [address] [place] [id number] [phone]        The results of significant diagnostics from this hospitalization (including imaging, microbiology, ancillary and laboratory) are listed below for reference.    Significant Diagnostic Studies: Dg Chest 2 View  11/27/2014   CLINICAL DATA:  Shortness of breath.  EXAM: CHEST  2 VIEW  COMPARISON:  11/23/2014, 11/22/2014.  FINDINGS: Mediastinum and hilar structures normal. Cardiomegaly with mild pulmonary vascular prominence and diffuse interstitial prominence with bilateral pleural effusions. These findings consistent congestive heart failure. No acute bony abnormality .  IMPRESSION: Cardiomegaly with pulmonary vascular prominence, bilateral interstitial prominence bilateral pleural effusions consistent with congestive heart failure. Pulmonary interstitial prominence has increased slightly from prior exams   Electronically Signed   By: [place]   On: 11/27/2014 09:33   Dg Chest 2 View  11/23/2014   CLINICAL DATA:  Left-sided pleural effusion with shortness of Breath  EXAM: CHEST  2 VIEW   COMPARISON:  11/22/2014  FINDINGS: Bilateral pleural effusions are again seen left greater than right. The degree of bibasilar opacities has improved in the interval from the prior exam. No new focal infiltrate is seen. No bony abnormality is noted.  IMPRESSION: Bilateral pleural effusions as described. Improved aeration is noted bilaterally.   Electronically Signed   By: Inez Catalina M.D.   On: 11/23/2014 12:23   Dg Chest Portable 1 View  11/22/2014   CLINICAL DATA:  Shortness of breath this morning. History of atrial fibrillation, polycythemia vera.  EXAM: PORTABLE CHEST - 1 VIEW  COMPARISON:  Chest radiograph October 14, 2013  FINDINGS: The cardiac silhouette is appears mild to moderately enlarged, mediastinal silhouette is nonsuspicious. Pulmonary vascular congestion, patchy LEFT greater than RIGHT lower lobe consolidation with small to moderate LEFT, small RIGHT pleural effusions. No pneumothorax. Mild osteopenia. Soft tissue planes are nonsuspicious.  IMPRESSION: Mild-to-moderate cardiomegaly. Pulmonary vascular congestion. LEFT greater than RIGHT alveolar airspace opacities and small pleural effusions concerning for pulmonary edema, though superimposed pneumonia cannot be excluded. Recommend followup chest radiograph after treatment to verify improvement.   Electronically Signed   By: Elon Alas   On: 11/22/2014 06:16    Microbiology: No results found for this or any previous visit (from the past 240 hour(s)).   Labs: Basic Metabolic Panel:  Recent Labs Lab 11/24/14 0631 11/25/14 0853 11/26/14 0512 11/27/14 0620 11/28/14 0644  NA 138 139 140 139 140  K 3.8 3.4* 4.0 3.9 3.2*  CL 104 106 105 105 104  CO2 24 25 26 25 25   GLUCOSE 91 90 95 100* 100*  BUN 16 15 20 22 23   CREATININE 0.97 0.91 0.93 0.93 0.97  CALCIUM 8.8 8.6 8.9 9.1 8.7   Liver Function Tests:  Recent Labs Lab 11/23/14 0610  AST 20  ALT 11  ALKPHOS 72  BILITOT 1.3*  PROT 6.1  ALBUMIN 3.4*   No results for  input(s): LIPASE, AMYLASE in the last 168 hours. No results for input(s): AMMONIA in the last 168 hours. CBC:  Recent Labs Lab 11/22/14 0631 11/27/14 0620 11/28/14 0644  WBC 7.9 10.7* 10.3  NEUTROABS 6.3  --   --   HGB 14.4 15.5* 14.1  HCT 45.7 47.6* 45.3  MCV 111.5* 105.8* 105.8*  PLT 779* 932* 852*   Cardiac Enzymes:  Recent Labs Lab 11/22/14 0631 11/22/14 1324 11/23/14 0610  TROPONINI <0.03 <0.03 <0.03   BNP: BNP (last 3 results)  Recent Labs  11/22/14 0631  BNP 456.0*    ProBNP (last 3 results) No results for input(s): PROBNP in the last 8760 hours.  CBG: No results for input(s): GLUCAP in the last 168 hours.     SignedRadene Gunning  [facility] 11/28/2014, 11:59 AM

## 2014-11-28 NOTE — Progress Notes (Signed)
SATURATION QUALIFICATIONS: (This note is used to comply with regulatory documentation for home oxygen)  Patient Saturations on Room Air at Rest  90%  Patient Saturations on Room Air while Ambulating  84%  Patient Saturations on 3 Liters of oxygen while Ambulating 92%  Please briefly explain why patient needs home oxygen:

## 2014-11-28 NOTE — Progress Notes (Signed)
Primary Cardiologist: Bronson Ing MD  Cardiology Specific Problem List:  1. Atrial fibrillation 2. Hypertension 3. Diastolic CHF  Subjective:    Feels great. Wants to go home. No issues with breathing    Objective:   Temp:  [97.7 F (36.5 C)-98.7 F (37.1 C)] 97.7 F (36.5 C) (04/26 0537) Pulse Rate:  [79-80] 80 (04/26 0537) Resp:  [18] 18 (04/26 0537) BP: (105-123)/(59-74) 123/74 mmHg (04/26 0537) SpO2:  [90 %-100 %] 95 % (04/26 0710) Weight:  [178 lb 8 oz (80.967 kg)] 178 lb 8 oz (80.967 kg) (04/26 0537) Last BM Date: 11/26/14  Filed Weights   11/26/14 0624 11/27/14 0651 11/28/14 0537  Weight: 180 lb 12.8 oz (82.01 kg) 177 lb 8 oz (80.513 kg) 178 lb 8 oz (80.967 kg)    Intake/Output Summary (Last 24 hours) at 11/28/14 [id number] Last data filed at 11/28/14 0653  Gross per 24 hour  Intake    720 ml  Output   1150 ml  Net   -430 ml    Telemetry: NSR rate of 60-80 bpm  Exam:  General: No acute distress.  HEENT: Conjunctiva and lids normal, oropharynx clear.  Lungs: Clear to auscultation, diminished in the bases, with mild crackles.   Cardiac: No elevated JVP or bruits. RRR, no gallop or rub.   Abdomen: Normoactive bowel sounds, nontender, nondistended.  Extremities: No pitting edema, distal pulses full.  Neuropsychiatric: Alert and oriented x3, affect appropriate.   Lab Results:  Basic Metabolic Panel:  Recent Labs Lab 11/26/14 0512 11/27/14 0620 11/28/14 0644  NA 140 139 140  K 4.0 3.9 3.2*  CL 105 105 104  CO2 26 25 25   GLUCOSE 95 100* 100*  BUN 20 22 23   CREATININE 0.93 0.93 0.97  CALCIUM 8.9 9.1 8.7    Liver Function Tests:  Recent Labs Lab 11/23/14 0610  AST 20  ALT 11  ALKPHOS 72  BILITOT 1.3*  PROT 6.1  ALBUMIN 3.4*    CBC:  Recent Labs Lab 11/22/14 0631 11/27/14 0620 11/28/14 0644  WBC 7.9 10.7* 10.3  HGB 14.4 15.5* 14.1  HCT 45.7 47.6* 45.3  MCV 111.5* 105.8* 105.8*  PLT 779* 932* 852*    Cardiac  Enzymes:  Recent Labs Lab 11/22/14 0631 11/22/14 1324 11/23/14 0610  TROPONINI <0.03 <0.03 <0.03   Coagulation:  Recent Labs Lab 11/26/14 0512 11/27/14 0620 11/28/14 0644  INR 2.30* 1.59* 1.38    Radiology: Dg Chest 2 View  11/27/2014   CLINICAL DATA:  Shortness of breath.  EXAM: CHEST  2 VIEW  COMPARISON:  11/23/2014, 11/22/2014.  FINDINGS: Mediastinum and hilar structures normal. Cardiomegaly with mild pulmonary vascular prominence and diffuse interstitial prominence with bilateral pleural effusions. These findings consistent congestive heart failure. No acute bony abnormality .  IMPRESSION: Cardiomegaly with pulmonary vascular prominence, bilateral interstitial prominence bilateral pleural effusions consistent with congestive heart failure. Pulmonary interstitial prominence has increased slightly from prior exams   Electronically Signed   By: [place]   On: 11/27/2014 09:33     Medications:   Scheduled Medications: . aspirin EC  81 mg Oral Daily  . diltiazem  300 mg Oral Daily  . furosemide  80 mg Intravenous Q12H  . gabapentin  100 mg Oral TID  . ipratropium-albuterol  3 mL Nebulization BID  . levothyroxine  75 mcg Oral QAC breakfast  . metoprolol succinate  100 mg Oral BID  . sodium chloride  3 mL Intravenous Q12H  . sodium chloride  3 mL  Intravenous Q12H  . Warfarin - Pharmacist Dosing Inpatient   Does not apply Q24H   PRN Medications: sodium chloride, acetaminophen **OR** acetaminophen, alum & mag hydroxide-simeth, hydrALAZINE, HYDROcodone-acetaminophen, ondansetron **OR** ondansetron (ZOFRAN) IV, polyethylene glycol, sodium chloride, traZODone   Assessment and Plan:   1.Atrial Fibrillation: Heart rate is controlled. Coumadin is restarted Will make a follow up in our office coumadin clinic for Monday, at 9:50 am. Follow up appt is made for our office in 2 weeks post hospitalization follow up.   2. Diastolic CHF:  Will d/c IV lasix and begin oral torsemide  20mg  bid. She will need a follow up BMET in one week.  3. Pleural Effusion: No need for thoracentesis with improvement in breathing with possible home O2 on discharge.     [name]. Lawrence NP [facility]  11/28/2014, 8:22 AM   1. Afib - rate controlled with dilt and metoprolol, anticoag with coumadin - continue current meds  2. Acute on chronic diastolic HF - I/O's are incomplete this admit, she is negative at least 2.5 liters but likely more. Weights are unclear but appears to be down around 3 lbs. From yesterday I/Os appears to have only put on 1119mL of urine despite receiving lasix IV bid.  - she is on lasix 80mg  bid, Cr and BUN overall stable.  - CXR yesterday still with pulm edema and pleural effusions  - overall her diuresis is difficult to assess by available I/Os and weights. She still has O2 requirement, xray yesterday still with pulm edema and effusions. Yesterday just 1150 out despite 80mg  of lasix bid. Will give dose of metolazone 2.5mg  today. Can change to oral torsemide.       Carlyle Dolly, M.D.

## 2014-11-29 ENCOUNTER — Other Ambulatory Visit (HOSPITAL_COMMUNITY): Payer: Self-pay | Admitting: Oncology

## 2014-11-29 DIAGNOSIS — E038 Other specified hypothyroidism: Secondary | ICD-10-CM

## 2014-11-29 LAB — CBC
HCT: 45.4 % (ref 36.0–46.0)
Hemoglobin: 14.6 g/dL (ref 12.0–15.0)
MCH: 33.5 pg (ref 26.0–34.0)
MCHC: 32.2 g/dL (ref 30.0–36.0)
MCV: 104.1 fL — ABNORMAL HIGH (ref 78.0–100.0)
Platelets: 973 10*3/uL (ref 150–400)
RBC: 4.36 MIL/uL (ref 3.87–5.11)
RDW: 26.9 % — ABNORMAL HIGH (ref 11.5–15.5)
WBC: 11.4 10*3/uL — ABNORMAL HIGH (ref 4.0–10.5)

## 2014-11-29 LAB — BASIC METABOLIC PANEL
Anion gap: 11 (ref 5–15)
BUN: 24 mg/dL — AB (ref 6–23)
CHLORIDE: 102 mmol/L (ref 96–112)
CO2: 26 mmol/L (ref 19–32)
CREATININE: 1.16 mg/dL — AB (ref 0.50–1.10)
Calcium: 9.2 mg/dL (ref 8.4–10.5)
GFR calc non Af Amer: 46 mL/min — ABNORMAL LOW (ref >90)
GFR, EST AFRICAN AMERICAN: 53 mL/min — AB (ref >90)
GLUCOSE: 95 mg/dL (ref 70–99)
Potassium: 3.6 mmol/L (ref 3.5–5.1)
Sodium: 139 mmol/L (ref 135–145)

## 2014-11-29 LAB — PROTIME-INR
INR: 1.45 (ref 0.00–1.49)
Prothrombin Time: 17.8 seconds — ABNORMAL HIGH (ref 11.6–15.2)

## 2014-11-29 MED ORDER — WARFARIN SODIUM 5 MG PO TABS: 5.0000 mg | ORAL_TABLET | Freq: Once | ORAL | Status: DC

## 2014-11-29 MED ORDER — POTASSIUM CHLORIDE ER 20 MEQ PO TBCR: 20.0000 meq | EXTENDED_RELEASE_TABLET | Freq: Every day | ORAL | Status: DC

## 2014-11-29 MED ORDER — POTASSIUM CHLORIDE ER 20 MEQ PO TBCR: 10.0000 meq | EXTENDED_RELEASE_TABLET | Freq: Every day | ORAL | Status: DC

## 2014-11-29 MED ORDER — POTASSIUM CHLORIDE CRYS ER 20 MEQ PO TBCR
20.0000 meq | EXTENDED_RELEASE_TABLET | Freq: Once | ORAL | Status: AC
  Administered 2014-11-29: 20 meq via ORAL
  Filled 2014-11-29: qty 1

## 2014-11-29 NOTE — Discharge Summary (Deleted)
No changes in status since discharge summary dated 11/28/14.   Will follow up with cardiology 12/04/14  Will follow up with PCP 12/12/14. Recommend BMET to track creatinine level.     Dyanne Carrel NP

## 2014-11-29 NOTE — Discharge Summary (Signed)
Physician Discharge Summary  Charlotte [name] [id number] DOB: 13-Jan-1941 DOA: 11/22/2014  PCP: [name], MD  Admit date: 11/22/2014 Discharge date: 11/29/2014  Time spent: 40 minutes  Recommendations for Outpatient Follow-up:  1. Follow up with cardiology 12/04/14 as scheduled for evaluation of CHF, volume status, afib and HTN control 2. PCP 12/12/14 for evaluation of respiratory status and continued need for home oxygen, recommend BMET to assess potassium level 3. Has appointment with oncology/hematology 12/21/14 for follow up. Hematology will contact patient to schedule lab work sooner than 5/19.   Discharge Diagnoses:  Principal Problem:   Acute respiratory failure with hypoxia Active Problems:   Polycythemia vera   Chronic atrial fibrillation   HTN (hypertension)   Hypothyroidism   Peripheral vascular disease   Pulmonary edema   Dyspnea   Pleural effusion   Hard of hearing   Thrombocytosis   Acute heart failure   Acute pulmonary edema   Acute diastolic congestive heart failure   Pleural effusion on left   Discharge Condition: stable  Diet recommendation: heart healthy  Filed Weights   11/27/14 0651 11/28/14 0537 11/29/14 0500  Weight: 80.513 kg (177 lb 8 oz) 80.967 kg (178 lb 8 oz) 80.513 kg (177 lb 8 oz)    History of present illness:  Charlotte [name] a very pleasant 74 y.o. female with a past medical history that includes A. fib on Coumadin, hypertension, PVD, peripheral vascular disease, DVT presented to the emergency department on 11/22/14 with chief complaint of shortness of breath. Initial evaluation revealed pulmonary edema and small pleural effusion per chest x-ray and an elevated BNP of 456.  Patient reported she had been developing gradual worsening shortness of breath for the previous 2 days. Night prior however she was unable to sleep or lie flat. She denied chest pain palpitations diaphoresis nausea vomiting headache visual disturbances numbness  or tingling of extremities. She denied lower extremity edema dysuria hematuria frequency or urgency. She denied fever chills or recent sick contacts. Reported compliance with all of her medications and physician visits. Workup in the emergency department included basic metabolic panel that was unremarkable, complete blood count significant for platelets of 779. Initial troponin is negative, BNP 456, INR 2.47, chest x-ray as noted above, EKG with A. Fib. She was afebrile blood pressure trending towards the higher end of normal heart rate 87 she was not hypoxic. In the emergency department she was provided with Lasix 40 mg IV at the time of my exam respiratory status much improved.   Hospital Course:   Acute respiratory failure with hypoxia. Related to CHF in setting of pleural effusion. Provided with IV lasix and diuresed between 2-3 liters. Continued to require oxygen to keep sats >90%. Repeat chest x-ray on 11/27/14 reviewed and showed slight improvement bilateral pleural effusions. Evaluated by cardiology who opined hypoxia not likely related to pleural effusions. ABG on room air with pO2 49.6 and CO2 32.5. At discharge sats 91-99% on 2L. Will discharge with home oxygen. Follow up with PCP in 1 week to evaluation continued need for home oxygen. Also has appointment with cardiology scheduled 12/04/14.  Acute diastolic congestive heart failure.IV lasix given. Her weight is 80.kg at discharge, down from 82.0kg. Evaluated by cardiology who recommend demadex 20mg  BID at discharge.will discharge with daily potassium supplement as well.    Hypertension. Remained stable  Chronic atrial fibrillation. Rate controlled on dilt and metoprolol.INR 1.3 at discharge. Coumadin held for awhile adn Vit K given in anticipation of possible thoracentesis.  5mg  coumadin given on day of discharge and home regimen resumed. Has appointment for INR check 12/04/14.  Hypothyroidism. TSH normal. Continue  Synthroid  Bilateral pleural effusions. Related to #2. See #1.   Polycythemia vera. Platelet count trending up during this hospitalization. Has appointment for follow up 5/19 with hem. Spoke with hematology and they will contact patient for lab work to be done earlier than 12/21/14 Follow-up with hematology as an outpatient as schedule on 12/21/14  Procedures:  Echo: - Left ventricle: The cavity size was normal. Wall thickness was normal. Systolic function was normal. The estimated ejection fraction was in the range of 55% to 60%. Wall motion was normal; there were no regional wall motion abnormalities. - Aortic valve: Mildly calcified annulus. Trileaflet; mildly thickened leaflets. Valve area (VTI): 2.15 cm^2. Valve area (Vmax): 2.35 cm^2. - Mitral valve: Mildly calcified annulus. Mildly thickened leaflets . There was mild regurgitation. - Left atrium: The atrium was severely dilated. - Right ventricle: The cavity size was mildly dilated. - Right atrium: The atrium was severely dilated. - Atrial septum: No defect or patent foramen ovale was identified. - Pulmonary arteries: Systolic pressure was moderately increased. PA peak pressure: 50 mm Hg (S). - Inferior vena cava: The vessel was dilated. The respirophasic diameter changes were blunted (< 50%), consistent with elevated central venous pressure. - Pericardium, extracardiac: There is a large left pleural  Consultations:  cardiology  Discharge Exam: Filed Vitals:   11/29/14 0647  BP: 110/63  Pulse: 80  Temp: 97.5 F (36.4 C)  Resp: 20    General: well nourished appears comfortable Cardiovascular: irregularly irregular no MGR No LE edema Respiratory: normal effort BS clear bilaterally no wheeze  Discharge Instructions    Current Discharge Medication List    START taking these medications   Details  Potassium Chloride ER 20 MEQ TBCR Take 20 mEq by mouth daily. Qty: 30 tablet, Refills: 1     torsemide (DEMADEX) 20 MG tablet Take 1 tablet (20 mg total) by mouth daily. Qty: 60 tablet, Refills: 1      CONTINUE these medications which have CHANGED   Details  diltiazem (CARDIZEM CD) 300 MG 24 hr capsule Take 1 capsule (300 mg total) by mouth daily. Qty: 30 capsule, Refills: 1      CONTINUE these medications which have NOT CHANGED   Details  aspirin EC 81 MG tablet Take 81 mg by mouth daily.    Cholecalciferol (VITAMIN D3) 5000 UNITS CAPS Take 1 capsule by mouth daily.    gabapentin (NEURONTIN) 100 MG capsule Take 100 mg by mouth 3 (three) times daily.      levothyroxine (SYNTHROID, LEVOTHROID) 75 MCG tablet    Associated Diagnoses: Polycythemia vera    metoprolol (TOPROL-XL) 100 MG 24 hr tablet Take 100 mg by mouth 2 (two) times daily.      warfarin (COUMADIN) 5 MG tablet Take 1 tablet daily except 1/2 tablet on Mondays, Wednesdays and Fridays Qty: 30 tablet, Refills: 6       No Known Allergies Follow-up Information    Follow up with Ermalinda Barrios, PA-C On 12/04/2014.   Specialty:  Cardiology   Why:  at 11:40 am   Contact information:   [address] [place] [address] [phone]       Follow up with Delman Cheadle, PA-C On 12/12/2014.   Specialty:  Family Medicine   Why:  appointment at 12 noon   Contact information:   [address] [place] [id number] [phone]  The results of significant diagnostics from this hospitalization (including imaging, microbiology, ancillary and laboratory) are listed below for reference.    Significant Diagnostic Studies: Dg Chest 2 View  11/27/2014   CLINICAL DATA:  Shortness of breath.  EXAM: CHEST  2 VIEW  COMPARISON:  11/23/2014, 11/22/2014.  FINDINGS: Mediastinum and hilar structures normal. Cardiomegaly with mild pulmonary vascular prominence and diffuse interstitial prominence with bilateral pleural effusions. These findings consistent congestive heart failure. No acute bony abnormality .   IMPRESSION: Cardiomegaly with pulmonary vascular prominence, bilateral interstitial prominence bilateral pleural effusions consistent with congestive heart failure. Pulmonary interstitial prominence has increased slightly from prior exams   Electronically Signed   By: [place]   On: 11/27/2014 09:33   Dg Chest 2 View  11/23/2014   CLINICAL DATA:  Left-sided pleural effusion with shortness of Breath  EXAM: CHEST  2 VIEW  COMPARISON:  11/22/2014  FINDINGS: Bilateral pleural effusions are again seen left greater than right. The degree of bibasilar opacities has improved in the interval from the prior exam. No new focal infiltrate is seen. No bony abnormality is noted.  IMPRESSION: Bilateral pleural effusions as described. Improved aeration is noted bilaterally.   Electronically Signed   By: Inez Catalina M.D.   On: 11/23/2014 12:23   Dg Chest Portable 1 View  11/22/2014   CLINICAL DATA:  Shortness of breath this morning. History of atrial fibrillation, polycythemia vera.  EXAM: PORTABLE CHEST - 1 VIEW  COMPARISON:  Chest radiograph October 14, 2013  FINDINGS: The cardiac silhouette is appears mild to moderately enlarged, mediastinal silhouette is nonsuspicious. Pulmonary vascular congestion, patchy LEFT greater than RIGHT lower lobe consolidation with small to moderate LEFT, small RIGHT pleural effusions. No pneumothorax. Mild osteopenia. Soft tissue planes are nonsuspicious.  IMPRESSION: Mild-to-moderate cardiomegaly. Pulmonary vascular congestion. LEFT greater than RIGHT alveolar airspace opacities and small pleural effusions concerning for pulmonary edema, though superimposed pneumonia cannot be excluded. Recommend followup chest radiograph after treatment to verify improvement.   Electronically Signed   By: Elon Alas   On: 11/22/2014 06:16    Microbiology: No results found for this or any previous visit (from the past 240 hour(s)).   Labs: Basic Metabolic Panel:  Recent Labs Lab  11/25/14 0853 11/26/14 0512 11/27/14 0620 11/28/14 0644 11/29/14 0657  NA 139 140 139 140 139  K 3.4* 4.0 3.9 3.2* 3.6  CL 106 105 105 104 102  CO2 25 26 25 25 26   GLUCOSE 90 95 100* 100* 95  BUN 15 20 22 23  24*  CREATININE 0.91 0.93 0.93 0.97 1.16*  CALCIUM 8.6 8.9 9.1 8.7 9.2   Liver Function Tests:  Recent Labs Lab 11/23/14 0610  AST 20  ALT 11  ALKPHOS 72  BILITOT 1.3*  PROT 6.1  ALBUMIN 3.4*   No results for input(s): LIPASE, AMYLASE in the last 168 hours. No results for input(s): AMMONIA in the last 168 hours. CBC:  Recent Labs Lab 11/27/14 0620 11/28/14 0644 11/29/14 0657  WBC 10.7* 10.3 11.4*  HGB 15.5* 14.1 14.6  HCT 47.6* 45.3 45.4  MCV 105.8* 105.8* 104.1*  PLT 932* 852* 973*   Cardiac Enzymes:  Recent Labs Lab 11/23/14 0610  TROPONINI <0.03   BNP: BNP (last 3 results)  Recent Labs  11/22/14 0631  BNP 456.0*    ProBNP (last 3 results) No results for input(s): PROBNP in the last 8760 hours.  CBG: No results for input(s): GLUCAP in the last 168 hours.  SignedRadene Gunning  [facility] 11/29/2014, 1:24 PM

## 2014-11-29 NOTE — Progress Notes (Signed)
Platelet count of 973,000. Dr.fisher was notified. Will continue to monitor

## 2014-11-29 NOTE — Progress Notes (Signed)
[place] for Warfarin Indication: atrial fibrillation, Hx DVT  No Known Allergies  Patient Measurements: Height: 6' (182.9 cm) Weight: 177 lb 8 oz (80.513 kg) IBW/kg (Calculated) : 73.1  Vital Signs: Temp: 97.5 F (36.4 C) (04/27 0647) Temp Source: Oral (04/27 0647) BP: 110/63 mmHg (04/27 0647) Pulse Rate: 80 (04/27 0647)  Labs:  Recent Labs  11/27/14 0620 11/28/14 0644 11/29/14 0657  HGB 15.5* 14.1 14.6  HCT 47.6* 45.3 45.4  PLT 932* 852* 973*  LABPROT 19.1* 17.1* 17.8*  INR 1.59* 1.38 1.45  CREATININE 0.93 0.97 1.16*   Estimated Creatinine Clearance: 50.6 mL/min (by C-G formula based on Cr of 1.16).  Medical History: Past Medical History  Diagnosis Date  . Hypertension   . Arthritis   . Varicose veins   . Deaf   . Elevated WBC count     and platelets  . Thyroid disease   . Irregular heart rate   . 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 Peripheral neuropathy     feet  . 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 Atrial fibrillation, chronic   . 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 Peripheral vascular disease   . Ulcer of ankle 05/2014    left and right ankle   Medications:  Prescriptions prior to admission  Medication Sig Dispense Refill Last Dose  . aspirin EC 81 MG tablet Take 81 mg by mouth daily.   11/21/2014 at 0500  . Cholecalciferol (VITAMIN D3) 5000 UNITS CAPS Take 1 capsule by mouth daily.   11/21/2014 at 1500  . diltiazem (CARDIZEM CD) 240 MG 24 hr capsule Take 1 capsule (240 mg total) by mouth daily. 90 capsule 3 11/22/2014 at 0500  . gabapentin (NEURONTIN) 100 MG capsule Take 100 mg by mouth 3 (three) times daily.     11/21/2014 at 2000  . levothyroxine (SYNTHROID, LEVOTHROID) 75 MCG tablet    11/21/2014 at 0500  . metoprolol (TOPROL-XL) 100 MG 24 hr tablet Take 100 mg by mouth 2 (two) times daily.     11/22/2014 at 0500  . warfarin (COUMADIN) 5 MG tablet Take 1 tablet daily except 1/2 tablet on Mondays,  Wednesdays and Fridays (Patient taking differently: Take 2.5-5 mg by mouth See admin instructions. Take 1 tablet daily except 1/2 tablet on Mondays, Wednesdays, Fridays and Saturdays.) 30 tablet 6 11/21/2014 at 1800   Assessment: 74 yo F on chronic Coumadin for Afib.  INR at goal on admission.  Home dose listed above. Coumadin was held and pt was given Vitamin K 2.5mg  4/25 in anticipation of thoracentesis but plans have changed & Coumadin resumed 4/26.  INR is below goal today due to Vit K. No bleeding noted.   Goal of Therapy:  INR 2-3   Plan:  Warfarin 5mg  PO today Daily PT/INR Plan resume previous home regimen at discharge  Biagio Borg 11/29/2014,8:36 AM

## 2014-11-29 NOTE — Progress Notes (Signed)
Patient ID: Charlotte [name], female   DOB: August 24, 1940, 74 y.o.   MRN: [id number]      Subjective:    SOB improving  Objective:   Temp:  [97.5 F (36.4 C)-98 F (36.7 C)] 97.5 F (36.4 C) (04/27 0647) Pulse Rate:  [80] 80 (04/27 0647) Resp:  [18-20] 20 (04/27 0647) BP: (100-110)/(58-63) 110/63 mmHg (04/27 0647) SpO2:  [90 %-96 %] 94 % (04/27 0738) Weight:  [177 lb 8 oz (80.513 kg)] 177 lb 8 oz (80.513 kg) (04/27 0500) Last BM Date: 11/26/14  Filed Weights   11/27/14 0651 11/28/14 0537 11/29/14 0500  Weight: 177 lb 8 oz (80.513 kg) 178 lb 8 oz (80.967 kg) 177 lb 8 oz (80.513 kg)    Intake/Output Summary (Last 24 hours) at 11/29/14 0819 Last data filed at 11/29/14 0630  Gross per 24 hour  Intake   1320 ml  Output   2450 ml  Net  -1130 ml    Telemetry: afib  Exam:  General: NAD  Resp: CTAB  Cardiac: irreg, no m/r/g, no JVD  [id number] soft, Nt, ND  MSK: no LE edema  Neuro: no focal deficits   Lab Results:  Basic Metabolic Panel:  Recent Labs Lab 11/27/14 0620 11/28/14 0644 11/29/14 0657  NA 139 140 139  K 3.9 3.2* 3.6  CL 105 104 102  CO2 25 25 26   GLUCOSE 100* 100* 95  BUN 22 23 24*  CREATININE 0.93 0.97 1.16*  CALCIUM 9.1 8.7 9.2    Liver Function Tests:  Recent Labs Lab 11/23/14 0610  AST 20  ALT 11  ALKPHOS 72  BILITOT 1.3*  PROT 6.1  ALBUMIN 3.4*    CBC:  Recent Labs Lab 11/27/14 0620 11/28/14 0644 11/29/14 0657  WBC 10.7* 10.3 11.4*  HGB 15.5* 14.1 14.6  HCT 47.6* 45.3 45.4  MCV 105.8* 105.8* 104.1*  PLT 932* 852* 973*    Cardiac Enzymes:  Recent Labs Lab 11/22/14 1324 11/23/14 0610  TROPONINI <0.03 <0.03    BNP: No results for input(s): PROBNP in the last 8760 hours.  Coagulation:  Recent Labs Lab 11/27/14 0620 11/28/14 0644 11/29/14 0657  INR 1.59* 1.38 1.45    ECG:   Medications:   Scheduled Medications: . aspirin EC  81 mg Oral Daily  . diltiazem  300 mg Oral Daily  . gabapentin  100 mg  Oral TID  . ipratropium-albuterol  3 mL Nebulization BID  . levothyroxine  75 mcg Oral QAC breakfast  . metoprolol succinate  100 mg Oral BID  . sodium chloride  3 mL Intravenous Q12H  . sodium chloride  3 mL Intravenous Q12H  . torsemide  20 mg Oral Daily  . Warfarin - Pharmacist Dosing Inpatient   Does not apply Q24H     Infusions:     PRN Medications:  sodium chloride, acetaminophen **OR** acetaminophen, alum & mag hydroxide-simeth, hydrALAZINE, HYDROcodone-acetaminophen, ondansetron **OR** ondansetron (ZOFRAN) IV, polyethylene glycol, sodium chloride, traZODone     Assessment/Plan    1.Atrial Fibrillation - rate controlled with dilt and metoprolol, anticoag with coumadin - continue current meds  2. Diastolic CHF: - I/O's are incomplete this admit, she is negative at least 3.6 liters but likely more. Weights are unclear but appears to be down at least 4 lbs.  - yesterday changed to oral torsemide, given one time dose of metolazone 2.5mg . Uptrend in Cr and BUN, agree with changing torsemide to 20mg  daily - from cardiac standpoint can consider discharge, will need home  O2 for a period at discharge. She can f/u with [place] in clinic in 1 week.       Carlyle Dolly, M.D.

## 2014-11-29 NOTE — Progress Notes (Signed)
NURSING PROGRESS NOTE  Charlotte [name] [id number] Discharge Data: 11/29/2014 7:07 PM Attending Provider: No att. providers found [id number], Betsy Coder, MD   [name] to be D/C'd Home per MD order.    All IV's discontinued and monitored for bleeding.  All belongings returned to patient for patient to take home.  AVS summary and prescriptions reviewed with patient and spouse.  Patient left floor via wheelchair, escorted by NT.  Last Documented Vital Signs:  Blood pressure 110/63, pulse 80, temperature 97.5 F (36.4 C), temperature source Oral, resp. rate 20, height 6' (1.829 m), weight 80.513 kg (177 lb 8 oz), SpO2 89 %.  [name] D

## 2014-11-29 NOTE — Progress Notes (Signed)
SATURATION QUALIFICATIONS: (This note is used to comply with regulatory documentation for home oxygen)  Patient Saturations on Room Air at Rest = 90%  Patient Saturations on Room Air while Ambulating = 84%  Patient Saturations on 2 Liters of oxygen while Ambulating = 90%  Please briefly explain why patient needs home oxygen: Unable to maintain O2 saturations >88% on room air with exertion.

## 2014-11-30 NOTE — Care Management Utilization Note (Signed)
UR completed 

## 2014-12-04 ENCOUNTER — Encounter (HOSPITAL_COMMUNITY): Payer: Self-pay | Admitting: Hematology & Oncology

## 2014-12-04 ENCOUNTER — Ambulatory Visit (INDEPENDENT_AMBULATORY_CARE_PROVIDER_SITE_OTHER): Payer: Medicare Other | Admitting: *Deleted

## 2014-12-04 ENCOUNTER — Encounter: Payer: Self-pay | Admitting: Physician Assistant

## 2014-12-04 ENCOUNTER — Ambulatory Visit (INDEPENDENT_AMBULATORY_CARE_PROVIDER_SITE_OTHER): Payer: Medicare Other | Admitting: Physician Assistant

## 2014-12-04 VITALS — BP 118/68 | HR 72 | Ht 72.0 in | Wt 177.4 lb

## 2014-12-04 DIAGNOSIS — I5032 Chronic diastolic (congestive) heart failure: Secondary | ICD-10-CM

## 2014-12-04 DIAGNOSIS — I482 Chronic atrial fibrillation, unspecified: Secondary | ICD-10-CM

## 2014-12-04 DIAGNOSIS — Z136 Encounter for screening for cardiovascular disorders: Secondary | ICD-10-CM | POA: Diagnosis not present

## 2014-12-04 DIAGNOSIS — Z5181 Encounter for therapeutic drug level monitoring: Secondary | ICD-10-CM | POA: Diagnosis not present

## 2014-12-04 DIAGNOSIS — I10 Essential (primary) hypertension: Secondary | ICD-10-CM

## 2014-12-04 LAB — BASIC METABOLIC PANEL
BUN: 24 mg/dL — AB (ref 6–23)
CALCIUM: 9.9 mg/dL (ref 8.4–10.5)
CHLORIDE: 102 meq/L (ref 96–112)
CO2: 25 mEq/L (ref 19–32)
Creat: 1.07 mg/dL (ref 0.50–1.10)
GLUCOSE: 79 mg/dL (ref 70–99)
Potassium: 5.3 mEq/L (ref 3.5–5.3)
Sodium: 142 mEq/L (ref 135–145)

## 2014-12-04 LAB — POCT INR: INR: 2

## 2014-12-04 NOTE — Progress Notes (Signed)
Cardiology Office Note   Date:  12/04/2014   ID:  Charlotte [name], DOB [birth year], MRN [id number]  PCP:  [name], MD  Cardiologist:  Dr. Bronson Ing  Chief Complaint: irreg heartbeat    History of Present Illness: Charlotte [name] is a 74 y.o. female who presents for post hospital follow-up after admission with congestive heart failure. 2-D echo showed normal LV function EF 55-60%. Diastolic dysfunction could not be evaluated because of atrial fibrillation however she had severe biatrial enlargement suggestive of significant diastolic dysfunction. PA SP was 50 when she had dilated IVC. She will diuresed and diltiazem was increased to 300 mg daily for better rate control. She had already been on Coumadin. She was sent home on oxygen but only had to use it 1 day. She is feeling much better and denies any worsening dyspnea, edema, palpitations or chest pain. She is watching her salt closely. She sees primary care later this week.  She has history of chronic atrial fibrillation, hypertension, peripheral vascular disease status post right femoral popliteal bypass and endarterectomy. She also has polycythemia vera treated with Hydrea for years but recently stopped because of severe weakness. Lexi scan Myoview in January 2015 was normal.    Past Medical History  Diagnosis Date  . Hypertension   . Arthritis   . Varicose veins   . Deaf   . Elevated WBC count     and platelets  . Thyroid disease   . Irregular heart rate   . 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 Peripheral neuropathy     feet  . 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 Atrial fibrillation, chronic   . 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 Peripheral vascular disease   . Ulcer of ankle 05/2014    left and right ankle    Past Surgical History  Procedure Laterality Date  . Blt    . Abdominal hysterectomy    . Coronary angioplasty    . Ct of abd    . Hip pinning,cannulated  11/19/2011    Procedure: CANNULATED  HIP PINNING;  Surgeon: Sanjuana Kava, MD;  Location: [facility];  Service: Orthopedics;  Laterality: Right;  . Fasciotomy  11/29/2011    Procedure: FASCIOTOMY;  Surgeon: Carole Civil, MD;  Location: [facility];  Service: Orthopedics;  Laterality: Right;  right thigh   . Dressing change under anesthesia  12/02/2011    Procedure: DRESSING CHANGE UNDER ANESTHESIA;  Surgeon: Carole Civil, MD;  Location: [facility];  Service: Orthopedics;  Laterality: Right;  . Endarterectomy femoral Right 09/15/2012    Procedure: ENDARTERECTOMY FEMORAL;  Surgeon: Mal Misty, MD;  Location: [place];  Service: Vascular;  Laterality: Right;  . Femoral-popliteal bypass graft Right 09/15/2012    Procedure: BYPASS GRAFT FEMORAL-POPLITEAL ARTERY;  Surgeon: Mal Misty, MD;  Location: [place];  Service: Vascular;  Laterality: Right;  . Patch angioplasty Right 09/15/2012    Procedure: PATCH ANGIOPLASTY;  Surgeon: Mal Misty, MD;  Location: [place];  Service: Vascular;  Laterality: Right;  . Abdominal aortagram N/A 09/14/2012    Procedure: ABDOMINAL [name];  Surgeon: Serafina Mitchell, MD;  Location: [facility] CATH LAB;  Service: Cardiovascular;  Laterality: N/A;     Current Outpatient Prescriptions  Medication Sig Dispense Refill  . aspirin EC 81 MG tablet Take 81 mg by mouth daily.    . Cholecalciferol (VITAMIN D3) 5000 UNITS CAPS Take 1 capsule by mouth daily.    Marland Kitchen diltiazem (CARDIZEM CD) 300 MG  24 hr capsule Take 1 capsule (300 mg total) by mouth daily. 30 capsule 1  . gabapentin (NEURONTIN) 100 MG capsule Take 100 mg by mouth 3 (three) times daily.      Marland Kitchen levothyroxine (SYNTHROID, LEVOTHROID) 75 MCG tablet     . metoprolol (TOPROL-XL) 100 MG 24 hr tablet Take 100 mg by mouth 2 (two) times daily.      . Potassium Chloride ER 20 MEQ TBCR Take 20 mEq by mouth daily. 30 tablet 1  . torsemide (DEMADEX) 20 MG tablet Take 1 tablet (20 mg total) by mouth daily. 60 tablet 1  . warfarin (COUMADIN) 5 MG tablet Take 1 tablet daily  except 1/2 tablet on Mondays, Wednesdays and Fridays (Patient taking differently: Take 2.5-5 mg by mouth See admin instructions. Take 1 tablet daily except 1/2 tablet on Mondays, Wednesdays, Fridays and Saturdays.) 30 tablet 6   No current facility-administered medications for this visit.    Allergies:   Review of patient's allergies indicates no known allergies.    Social History:  The patient  reports that she has never smoked. She has never used smokeless tobacco. She reports that she does not drink alcohol or use illicit drugs.   Family History:  The patient's family history includes Diabetes in her son.    ROS:  Please see the history of present illness.   Otherwise, review of systems are positive for none.   All other systems are reviewed and negative.    PHYSICAL EXAM: VS:  BP 118/68 mmHg  Pulse 72  Ht 6' (1.829 m)  Wt 177 lb 6.4 oz (80.468 kg)  BMI 24.05 kg/m2  SpO2 97% , BMI Body mass index is 24.05 kg/(m^2). GEN: Well nourished, well developed, in no acute distress Neck: no JVD, HJR, carotid bruits, or masses Cardiac: Irreg irreg; [id number] systolic murmur LSB no gallop, rubs, thrill or heave,  Respiratory:  clear to auscultation bilaterally, normal work of breathing GI: soft, nontender, nondistended, + BS MS: no deformity or atrophy Extremities: without cyanosis, clubbing, edema, good distal pulses bilaterally.  Skin: warm and dry, no rash Neuro:  Strength and sensation are intact    EKG:  EKG is ordered today. The ekg ordered today demonstrates atrial fibrillation at 77 beats per minute, no acute change Recent Labs: 11/22/2014: B Natriuretic Peptide 456.0*; TSH 1.984 11/23/2014: ALT 11 11/29/2014: BUN 24*; Creatinine 1.16*; Hemoglobin 14.6; Platelets 973*; Potassium 3.6; Sodium 139    Lipid Panel    Component Value Date/Time   CHOL  07/19/2009 0448    119        ATP III CLASSIFICATION:  <200     mg/dL   Desirable  200-239  mg/dL   Borderline High  >=240     mg/dL   High          TRIG 124 07/19/2009 0448   HDL 32* 07/19/2009 0448   CHOLHDL 3.7 07/19/2009 0448   VLDL 25 07/19/2009 0448   LDLCALC  07/19/2009 0448    62        Total Cholesterol/HDL:CHD Risk Coronary Heart Disease Risk Table                     Men   Women  1/2 Average Risk   3.4   3.3  Average Risk       5.0   4.4  2 X Average Risk   9.6   7.1  3 X Average Risk  23.4   11.0  Use the calculated Patient Ratio above and the CHD Risk Table to determine the patient's CHD Risk.        ATP III CLASSIFICATION (LDL):  <100     mg/dL   Optimal  100-129  mg/dL   Near or Above                    Optimal  130-159  mg/dL   Borderline  160-189  mg/dL   High  >190     mg/dL   Very High      Wt Readings from Last 3 Encounters:  12/04/14 177 lb 6.4 oz (80.468 kg)  11/29/14 177 lb 8 oz (80.513 kg)  11/09/14 178 lb 12.8 oz (81.103 kg)      Other studies Reviewed: Additional studies/ records that were reviewed today include and review of the records demonstrates:  Echocardiogram 11/22/2014 Left ventricle: The cavity size was normal. Wall thickness was   normal. Systolic function was normal. The estimated ejection   fraction was in the range of 55% to 60%. Wall motion was normal;   there were no regional wall motion abnormalities. - Aortic valve: Mildly calcified annulus. Trileaflet; mildly   thickened leaflets. Valve area (VTI): 2.15 cm^2. Valve area   (Vmax): 2.35 cm^2. - Mitral valve: Mildly calcified annulus. Mildly thickened leaflets   . There was mild regurgitation. - Left atrium: The atrium was severely dilated. - Right ventricle: The cavity size was mildly dilated. - Right atrium: The atrium was severely dilated. - Atrial septum: No defect or patent foramen ovale was identified. - Pulmonary arteries: Systolic pressure was moderately increased.   PA peak pressure: 50 mm Hg (S). - Inferior vena cava: The vessel was dilated. The respirophasic   diameter changes  were blunted (< 50%), consistent with elevated   central venous pressure. - Pericardium, extracardiac: There is a large left pleural   effusion. - Technically adequate study.     ASSESSMENT AND PLAN:  Chronic diastolic congestive heart failure Patient had recent admission for acute on chronic diastolic heart failure. 2-D echo showed normal LV function. Diastolic function could not be assessed but she had severely dilated right and left atrium PA pressure was 50 mmHg. She diuresed and is currently compensated. Will check renal function and potassium today. Continue current diuretics   Chronic atrial fibrillation Rate better controlled on higher dose diltiazem. Continue metoprolol and Coumadin.   HTN (hypertension) Controlled     Signed, Ermalinda Barrios, PA-C  12/04/2014 12:10 PM    [facility] [address], [place], [place]  [address] Phone: [phone]; Fax: [phone]

## 2014-12-04 NOTE — Addendum Note (Signed)
Addended by: Imogene Burn on: 12/04/2014 12:35 PM   Modules accepted: Level of Service

## 2014-12-04 NOTE — Assessment & Plan Note (Signed)
Controlled.  

## 2014-12-04 NOTE — Assessment & Plan Note (Signed)
Rate better controlled on higher dose diltiazem. Continue metoprolol and Coumadin.

## 2014-12-04 NOTE — Assessment & Plan Note (Signed)
Patient had recent admission for acute on chronic diastolic heart failure. 2-D echo showed normal LV function. Diastolic function could not be assessed but she had severely dilated right and left atrium PA pressure was 50 mmHg. She diuresed and is currently compensated. Will check renal function and potassium today. Continue current diuretics

## 2014-12-04 NOTE — Patient Instructions (Signed)
Your physician recommends that you schedule a follow-up appointment in: 4 -6 weeks with Dr. Bronson Ing  Your physician recommends that you return for lab work in: BMET  Your physician recommends that you continue on your current medications as directed. Please refer to the Current Medication list given to you today.  Thank you for choosing [facility]!

## 2014-12-11 ENCOUNTER — Encounter: Payer: Medicare Other | Admitting: Adult Health

## 2014-12-12 ENCOUNTER — Encounter (HOSPITAL_COMMUNITY): Payer: Self-pay | Admitting: Emergency Medicine

## 2014-12-12 ENCOUNTER — Other Ambulatory Visit (HOSPITAL_COMMUNITY): Payer: Self-pay | Admitting: Hematology & Oncology

## 2014-12-12 ENCOUNTER — Encounter (HOSPITAL_COMMUNITY): Payer: Medicare Other | Attending: Hematology and Oncology

## 2014-12-12 DIAGNOSIS — Z6824 Body mass index (BMI) 24.0-24.9, adult: Secondary | ICD-10-CM | POA: Diagnosis not present

## 2014-12-12 DIAGNOSIS — D45 Polycythemia vera: Secondary | ICD-10-CM | POA: Diagnosis not present

## 2014-12-12 DIAGNOSIS — H9193 Unspecified hearing loss, bilateral: Secondary | ICD-10-CM | POA: Diagnosis not present

## 2014-12-12 DIAGNOSIS — J90 Pleural effusion, not elsewhere classified: Secondary | ICD-10-CM | POA: Diagnosis not present

## 2014-12-12 DIAGNOSIS — I482 Chronic atrial fibrillation: Secondary | ICD-10-CM | POA: Diagnosis not present

## 2014-12-12 DIAGNOSIS — I5031 Acute diastolic (congestive) heart failure: Secondary | ICD-10-CM | POA: Diagnosis not present

## 2014-12-12 LAB — CBC WITH DIFFERENTIAL/PLATELET
BASOS PCT: 1 % (ref 0–1)
Basophils Absolute: 0.2 10*3/uL — ABNORMAL HIGH (ref 0.0–0.1)
EOS PCT: 1 % (ref 0–5)
Eosinophils Absolute: 0.2 10*3/uL (ref 0.0–0.7)
HEMATOCRIT: 45.5 % (ref 36.0–46.0)
Hemoglobin: 14.3 g/dL (ref 12.0–15.0)
LYMPHS ABS: 1.4 10*3/uL (ref 0.7–4.0)
Lymphocytes Relative: 9 % — ABNORMAL LOW (ref 12–46)
MCH: 31 pg (ref 26.0–34.0)
MCHC: 31.4 g/dL (ref 30.0–36.0)
MCV: 98.5 fL (ref 78.0–100.0)
MONO ABS: 0.8 10*3/uL (ref 0.1–1.0)
MONOS PCT: 5 % (ref 3–12)
Neutro Abs: 14.2 10*3/uL — ABNORMAL HIGH (ref 1.7–7.7)
Neutrophils Relative %: 84 % — ABNORMAL HIGH (ref 43–77)
Platelets: 1024 10*3/uL (ref 150–400)
RBC: 4.62 MIL/uL (ref 3.87–5.11)
RDW: 28.4 % — ABNORMAL HIGH (ref 11.5–15.5)
WBC: 16.8 10*3/uL — ABNORMAL HIGH (ref 4.0–10.5)

## 2014-12-12 MED ORDER — ANAGRELIDE HCL 1 MG PO CAPS: 1.0000 mg | ORAL_CAPSULE | Freq: Two times a day (BID) | ORAL | Status: DC

## 2014-12-12 NOTE — Progress Notes (Signed)
CRITICAL VALUE ALERT Critical value received:  Platelets [id number] Date of notification:  12/12/2014 Time of notification: [id number] Critical value read back:  Yes.   Nurse who received alert:  Shellia Carwin RN MD notified (1st page):  Dr Micheline Maze pt to notify her to start taking hydrea 500 mg once a day again.  Follow up with Dr Barbee Shropshire on 12/21/2014.  Pt verbalized understanding

## 2014-12-12 NOTE — Progress Notes (Signed)
LABS DRAWN

## 2014-12-21 ENCOUNTER — Encounter (HOSPITAL_COMMUNITY): Payer: Self-pay | Admitting: Hematology & Oncology

## 2014-12-21 ENCOUNTER — Encounter (HOSPITAL_BASED_OUTPATIENT_CLINIC_OR_DEPARTMENT_OTHER): Payer: Medicare Other | Admitting: Hematology & Oncology

## 2014-12-21 ENCOUNTER — Encounter (HOSPITAL_BASED_OUTPATIENT_CLINIC_OR_DEPARTMENT_OTHER): Payer: Medicare Other

## 2014-12-21 ENCOUNTER — Telehealth (HOSPITAL_COMMUNITY): Payer: Self-pay

## 2014-12-21 VITALS — BP 108/70 | HR 67 | Temp 98.0°F | Resp 20 | Wt 182.2 lb

## 2014-12-21 DIAGNOSIS — D47Z9 Other specified neoplasms of uncertain behavior of lymphoid, hematopoietic and related tissue: Secondary | ICD-10-CM

## 2014-12-21 DIAGNOSIS — D45 Polycythemia vera: Secondary | ICD-10-CM | POA: Diagnosis present

## 2014-12-21 DIAGNOSIS — I4891 Unspecified atrial fibrillation: Secondary | ICD-10-CM | POA: Diagnosis not present

## 2014-12-21 LAB — CBC WITH DIFFERENTIAL/PLATELET
Basophils Absolute: 0.3 10*3/uL — ABNORMAL HIGH (ref 0.0–0.1)
Basophils Relative: 2 % — ABNORMAL HIGH (ref 0–1)
EOS PCT: 2 % (ref 0–5)
Eosinophils Absolute: 0.3 10*3/uL (ref 0.0–0.7)
HEMATOCRIT: 45 % (ref 36.0–46.0)
Hemoglobin: 13.9 g/dL (ref 12.0–15.0)
LYMPHS PCT: 10 % — AB (ref 12–46)
Lymphs Abs: 1.4 10*3/uL (ref 0.7–4.0)
MCH: 28.9 pg (ref 26.0–34.0)
MCHC: 30.9 g/dL (ref 30.0–36.0)
MCV: 93.6 fL (ref 78.0–100.0)
MONO ABS: 0.6 10*3/uL (ref 0.1–1.0)
Monocytes Relative: 4 % (ref 3–12)
Neutro Abs: 11.8 10*3/uL — ABNORMAL HIGH (ref 1.7–7.7)
Neutrophils Relative %: 82 % — ABNORMAL HIGH (ref 43–77)
PLATELETS: 925 10*3/uL — AB (ref 150–400)
RBC: 4.81 MIL/uL (ref 3.87–5.11)
RDW: 28.5 % — ABNORMAL HIGH (ref 11.5–15.5)
WBC: 14.3 10*3/uL — ABNORMAL HIGH (ref 4.0–10.5)

## 2014-12-21 LAB — COMPREHENSIVE METABOLIC PANEL
ALK PHOS: 96 U/L (ref 38–126)
ALT: 10 U/L — ABNORMAL LOW (ref 14–54)
ANION GAP: 9 (ref 5–15)
AST: 18 U/L (ref 15–41)
Albumin: 3.5 g/dL (ref 3.5–5.0)
BUN: 18 mg/dL (ref 6–20)
CALCIUM: 8.9 mg/dL (ref 8.9–10.3)
CO2: 26 mmol/L (ref 22–32)
Chloride: 105 mmol/L (ref 101–111)
Creatinine, Ser: 1.04 mg/dL — ABNORMAL HIGH (ref 0.44–1.00)
GFR calc non Af Amer: 52 mL/min — ABNORMAL LOW (ref >60)
GLUCOSE: 89 mg/dL (ref 65–99)
Potassium: 4.7 mmol/L (ref 3.5–5.1)
Sodium: 140 mmol/L (ref 135–145)
Total Bilirubin: 1.5 mg/dL — ABNORMAL HIGH (ref 0.3–1.2)
Total Protein: 6.4 g/dL — ABNORMAL LOW (ref 6.5–8.1)

## 2014-12-21 LAB — FERRITIN: Ferritin: 23 ng/mL (ref 11–307)

## 2014-12-21 NOTE — Telephone Encounter (Signed)
CRITICAL VALUE ALERT Critical value received:  Platelet count [phone] Date of notification:  12/21/14 Time of notification: 11:30 Critical value read back:  Yes.   Nurse who received alert:  Mickie Kay, RN MD notified (1st page):  Dr. Whitney Muse - patient being seen in clinic today.

## 2014-12-21 NOTE — Progress Notes (Signed)
[name], MD [place] [id number]    DIAGNOSIS: JAK 2 positive MPD, P. Vera   CURRENT THERAPY: Hydrea  INTERVAL HISTORY: Charlotte [name] 74 y.o. 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   She had developed pancytopenia on her hydrea with worsening fatigue. Therapy was interrupted with improvement in her ADL's and energy level.  She however has just been admitted with newly diagnosed atrial fibrillation and diastolic CHF. Hemoglobin and HCT have completely normalized but platelet count today is 925K. She denies any bleeding or bruising.She is now on coumadin and continues on baby asa daily.   She has a follow-up appointment with cardiology on Friday.  MEDICAL HISTORY: Past Medical History  Diagnosis Date  . Hypertension   . Arthritis   . Varicose veins   . Deaf   . Elevated WBC count     and platelets  . Thyroid disease   . Irregular heart rate   . 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 Peripheral neuropathy     feet  . 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 Atrial fibrillation, chronic   . 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 Peripheral vascular disease   . Ulcer of ankle 05/2014    left and right ankle  . Heart failure   . 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     has No Known Allergies.  Current Outpatient Prescriptions on File Prior to Visit  Medication Sig Dispense Refill  . aspirin EC 81 MG tablet Take 81 mg by mouth daily.    . Cholecalciferol (VITAMIN D3) 5000 UNITS CAPS Take 1 capsule by mouth daily.    Marland Kitchen diltiazem (CARDIZEM CD) 300 MG 24 hr capsule Take 1 capsule (300 mg total) by mouth daily. 30 capsule 1  . gabapentin (NEURONTIN) 100 MG capsule Take 100 mg by mouth 3 (three) times daily.      . hydroxyurea (HYDREA) 500 MG capsule Take 500 mg by mouth daily. May take with food to minimize GI side effects.    Marland Kitchen levothyroxine (SYNTHROID, LEVOTHROID) 75 MCG tablet     . metoprolol (TOPROL-XL) 100 MG 24 hr tablet Take 100 mg by mouth 2 (two) times daily.      . Potassium Chloride ER 20 MEQ TBCR Take 20 mEq by mouth daily. 30 tablet 1  . torsemide (DEMADEX) 20 MG tablet Take 1 tablet (20 mg total) by mouth daily. 60 tablet 1  . warfarin (COUMADIN) 5 MG tablet Take 1 tablet daily except 1/2 tablet on Mondays, Wednesdays and Fridays (Patient taking differently: Take 2.5-5 mg by mouth See admin instructions. Take 1 tablet daily except 1/2 tablet on Mondays, Wednesdays, Fridays and Saturdays.) 30 tablet 6   No current facility-administered medications on file prior to visit.     SURGICAL HISTORY: Past  Surgical History  Procedure Laterality Date  . Blt    . Abdominal hysterectomy    . Coronary angioplasty    . Ct of abd    . Hip pinning,cannulated  11/19/2011    Procedure: CANNULATED HIP PINNING;  Surgeon: Sanjuana Kava, MD;  Location: [facility];  Service: Orthopedics;  Laterality: Right;  . Fasciotomy  11/29/2011    Procedure: FASCIOTOMY;  Surgeon: Carole Civil, MD;  Location: [facility];  Service: Orthopedics;  Laterality: Right;  right thigh     . Dressing change under anesthesia  12/02/2011    Procedure: DRESSING CHANGE UNDER ANESTHESIA;  Surgeon: Carole Civil, MD;  Location: [facility];  Service: Orthopedics;  Laterality: Right;  . Endarterectomy femoral Right 09/15/2012    Procedure: ENDARTERECTOMY FEMORAL;  Surgeon: Mal Misty, MD;  Location: [place];  Service: Vascular;  Laterality: Right;  . Femoral-popliteal bypass graft Right 09/15/2012    Procedure: BYPASS GRAFT FEMORAL-POPLITEAL ARTERY;  Surgeon: Mal Misty, MD;  Location: [place];  Service: Vascular;  Laterality: Right;  . Patch angioplasty Right 09/15/2012    Procedure: PATCH ANGIOPLASTY;  Surgeon: Mal Misty, MD;  Location: [place];  Service: Vascular;  Laterality: Right;  . Abdominal aortagram N/A 09/14/2012    Procedure: ABDOMINAL [name];  Surgeon: Serafina Mitchell, MD;  Location: [facility] CATH LAB;  Service: Cardiovascular;  Laterality: N/A;    SOCIAL HISTORY: History   Social History  . Marital Status: Married    Spouse Name: N/A  . Number of Children: N/A  . Years of Education: N/A   Occupational History  . Not on file.   Social History Main Topics  . Smoking status: Never Smoker   . Smokeless tobacco: Never Used  . Alcohol Use: No  . Drug Use: No  . Sexual Activity: No   Other Topics Concern  . Not on file   Social History Narrative    FAMILY HISTORY: Family History  Problem Relation Age of Onset  . Diabetes Son     Review of Systems  Constitutional: Positive for malaise/fatigue. Negative for fever, chills and weight loss.  HENT: Negative for congestion, hearing loss, nosebleeds, sore throat and tinnitus.   Eyes: Negative for blurred vision, double vision, pain and discharge.  Respiratory: Positive for shortness of breath. Negative for cough, hemoptysis, sputum production and wheezing.   Cardiovascular: Negative for chest pain, palpitations, claudication, leg swelling and PND.  Gastrointestinal: Negative for heartburn, nausea, vomiting,  abdominal pain, diarrhea, constipation, blood in stool and melena.  Genitourinary: Negative for dysuria, urgency, frequency and hematuria.  Musculoskeletal: Negative for myalgias, joint pain and falls.  Skin: Negative for itching and rash.  Neurological: Positive for weakness. Negative for dizziness, tingling, tremors, sensory change, speech change, focal weakness, seizures, loss of consciousness and headaches.  Endo/Heme/Allergies: Does not bruise/bleed easily.  Psychiatric/Behavioral: Negative for depression, suicidal ideas, memory loss and substance abuse. The patient is not nervous/anxious and does not have insomnia.     PHYSICAL EXAMINATION  ECOG PERFORMANCE STATUS: 1 - Symptomatic but completely ambulatory  Filed Vitals:   12/21/14 1055  BP: 108/70  Pulse: 67  Temp: 98 F (36.7 C)  Resp: 20    Physical Exam  Constitutional: She is oriented to person, place, and time and well-developed, well-nourished, and in no distress.  HENT:  Head: Normocephalic and atraumatic.  Nose: Nose normal.  Mouth/Throat: Oropharynx is clear and moist. No oropharyngeal exudate.  Eyes: Conjunctivae and EOM are normal. Pupils are equal,  round, and reactive to light. Right eye exhibits no discharge. Left eye exhibits no discharge. No scleral icterus.  Neck: Normal range of motion. Neck supple. No tracheal deviation present. No thyromegaly present.  Cardiovascular: Normal rate, regular rhythm and normal heart sounds.  Exam reveals no gallop and no friction rub.   No murmur heard. Pulmonary/Chest: Effort normal and breath sounds normal. She has no wheezes. She has no rales.  Abdominal: Soft. Bowel sounds are normal. She exhibits no distension and no mass. There is no tenderness. There is no rebound and no guarding.  Musculoskeletal: Normal range of motion. She exhibits no edema.  Lymphadenopathy:    She has no cervical adenopathy.  Neurological: She is alert and oriented to person, place, and time. She  has normal reflexes. No cranial nerve deficit. Gait normal. Coordination normal.  Skin: Skin is warm and dry. No rash noted.  Psychiatric: Mood, memory, affect and judgment normal.  Nursing note and vitals reviewed.   LABORATORY DATA:  CBC    Component Value Date/Time   WBC 14.3* 12/21/2014 1104   RBC 4.81 12/21/2014 1104   RBC 3.48* 08/19/2013 0828   HGB 13.9 12/21/2014 1104   HCT 45.0 12/21/2014 1104   PLT 925* 12/21/2014 1104   MCV 93.6 12/21/2014 1104   MCH 28.9 12/21/2014 1104   MCHC 30.9 12/21/2014 1104   RDW 28.5* 12/21/2014 1104   LYMPHSABS 1.4 12/21/2014 1104   MONOABS 0.6 12/21/2014 1104   EOSABS 0.3 12/21/2014 1104   BASOSABS 0.3* 12/21/2014 1104   CMP     Component Value Date/Time   NA 140 12/21/2014 1104   K 4.7 12/21/2014 1104   CL 105 12/21/2014 1104   CO2 26 12/21/2014 1104   GLUCOSE 89 12/21/2014 1104   BUN 18 12/21/2014 1104   CREATININE 1.04* 12/21/2014 1104   CREATININE 1.07 12/04/2014 1248   CALCIUM 8.9 12/21/2014 1104   PROT 6.4* 12/21/2014 1104   ALBUMIN 3.5 12/21/2014 1104   AST 18 12/21/2014 1104   ALT 10* 12/21/2014 1104   ALKPHOS 96 12/21/2014 1104   BILITOT 1.5* 12/21/2014 1104   GFRNONAA 52* 12/21/2014 1104   GFRAA >60 12/21/2014 1104     ASSESSMENT and THERAPY PLAN:  JAK 2 positive MPD Atrial Fibrillation Anticoagulation with coumadin  We will increase her hydrea to 500 mg po bid. She is not a candidate for anagrelide given her cardiac issues. I am hoping that with slow titration we can get her on an acceptable dose of hydrea that will maintain her other counts and control her thrombocytosis.  She will return in one month with repeat labs and an office visit. Hydrea will be adjusted pending her labs at follow-up.  All questions were answered. The patient knows to call the clinic with any problems, questions or concerns. We can certainly see the patient much sooner if necessary. This note was electronically signed.  This  document serves as a record of services personally performed by Ancil Linsey, MD. It was created on her behalf by Pearlie Oyster, a trained medical scribe. The creation of this record is based on the scribe's personal observations and the provider's statements to them. This document has been checked and approved by the attending provider.    I have reviewed the above documentation for accuracy and completeness, and I agree with the above.  Kelby Fam. Penland MD

## 2014-12-21 NOTE — Patient Instructions (Signed)
[place] at [facility] Discharge Instructions  RECOMMENDATIONS MADE BY THE CONSULTANT AND ANY TEST RESULTS WILL BE SENT TO YOUR REFERRING PHYSICIAN.  Exam and discussion by Dr. Whitney Muse. Please use your oxygen at home.  Your saturation is too low.  It will make you feel better. Increase your hydrea to 500 mg in the am and 500 mg in the afternoon. Labs every 2 weeks and office visit in 1 month.  Thank you for choosing [place] at [facility] to provide your oncology and hematology care.  To afford each patient quality time with our provider, please arrive at least 15 minutes before your scheduled appointment time.    You need to re-schedule your appointment should you arrive 10 or more minutes late.  We strive to give you quality time with our providers, and arriving late affects you and other patients whose appointments are after yours.  Also, if you no show three or more times for appointments you may be dismissed from the clinic at the providers discretion.     Again, thank you for choosing [facility].  Our hope is that these requests will decrease the amount of time that you wait before being seen by our physicians.       _____________________________________________________________  Should you have questions after your visit to [facility], please contact our office at [phone] between the hours of 8:30 a.m. and 4:30 p.m.  Voicemails left after 4:30 p.m. will not be returned until the following business day.  For prescription refill requests, have your pharmacy contact our office.

## 2014-12-21 NOTE — Progress Notes (Signed)
Labs drawn

## 2014-12-25 ENCOUNTER — Ambulatory Visit (INDEPENDENT_AMBULATORY_CARE_PROVIDER_SITE_OTHER): Payer: Medicare Other | Admitting: *Deleted

## 2014-12-25 DIAGNOSIS — I482 Chronic atrial fibrillation, unspecified: Secondary | ICD-10-CM

## 2014-12-25 DIAGNOSIS — Z5181 Encounter for therapeutic drug level monitoring: Secondary | ICD-10-CM | POA: Diagnosis not present

## 2014-12-25 LAB — POCT INR: INR: 3.8

## 2014-12-29 ENCOUNTER — Ambulatory Visit (INDEPENDENT_AMBULATORY_CARE_PROVIDER_SITE_OTHER): Payer: Medicare Other | Admitting: Cardiovascular Disease

## 2014-12-29 ENCOUNTER — Encounter: Payer: Self-pay | Admitting: Cardiovascular Disease

## 2014-12-29 VITALS — BP 116/68 | HR 92 | Ht 72.0 in | Wt 179.0 lb

## 2014-12-29 DIAGNOSIS — I482 Chronic atrial fibrillation, unspecified: Secondary | ICD-10-CM

## 2014-12-29 DIAGNOSIS — I5032 Chronic diastolic (congestive) heart failure: Secondary | ICD-10-CM | POA: Diagnosis not present

## 2014-12-29 DIAGNOSIS — I10 Essential (primary) hypertension: Secondary | ICD-10-CM | POA: Diagnosis not present

## 2014-12-29 DIAGNOSIS — I739 Peripheral vascular disease, unspecified: Secondary | ICD-10-CM | POA: Diagnosis not present

## 2014-12-29 NOTE — Addendum Note (Signed)
Addended by: Barbarann Ehlers A on: 12/29/2014 02:29 PM   Modules accepted: Level of Service

## 2014-12-29 NOTE — Progress Notes (Signed)
Patient ID: Charlotte [name], female   DOB: 1941-01-04, 74 y.o.   MRN: [id number]      SUBJECTIVE: Mrs. Vandevander presents for follow up of chronic atrial fibrillation, chronic diastolic heart failure, polycythemia vera, HTN, and PVD with a h/o right fem-pop bypass and endarterectomy.   She was hospitalized earlier this year for heart failure. Echocardiogram showed normal LV systolic function, EF 0000000. Diastolic dysfunction could not be evaluated because of atrial fibrillation however she had severe biatrial enlargement suggestive of significant diastolic dysfunction. PASP was 50 and she had a dilated IVC.  She is doing well and denies palpitations. She sometimes gets short of breath and uses 2L oxygen but this is infrequent.   Review of Systems: As per "subjective", otherwise negative.  No Known Allergies  Current Outpatient Prescriptions  Medication Sig Dispense Refill  . aspirin EC 81 MG tablet Take 81 mg by mouth daily.    . Cholecalciferol (VITAMIN D3) 5000 UNITS CAPS Take 1 capsule by mouth daily.    Marland Kitchen diltiazem (CARDIZEM CD) 300 MG 24 hr capsule Take 1 capsule (300 mg total) by mouth daily. 30 capsule 1  . gabapentin (NEURONTIN) 100 MG capsule Take 100 mg by mouth 3 (three) times daily.      . hydroxyurea (HYDREA) 500 MG capsule Take 500 mg by mouth daily. May take with food to minimize GI side effects.    Marland Kitchen levothyroxine (SYNTHROID, LEVOTHROID) 75 MCG tablet     . metoprolol (TOPROL-XL) 100 MG 24 hr tablet Take 100 mg by mouth 2 (two) times daily.      . Potassium Chloride ER 20 MEQ TBCR Take 20 mEq by mouth daily. 30 tablet 1  . torsemide (DEMADEX) 20 MG tablet Take 1 tablet (20 mg total) by mouth daily. 60 tablet 1  . warfarin (COUMADIN) 5 MG tablet Take 1 tablet daily except 1/2 tablet on Mondays, Wednesdays and Fridays (Patient taking differently: Take 2.5-5 mg by mouth See admin instructions. Take 1 tablet daily except 1/2 tablet on Mondays, Wednesdays, Fridays and Saturdays.)  30 tablet 6   No current facility-administered medications for this visit.    Past Medical History  Diagnosis Date  . Hypertension   . Arthritis   . Varicose veins   . Deaf   . Elevated WBC count     and platelets  . Thyroid disease   . Irregular heart rate   . 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 Peripheral neuropathy     feet  . 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 Atrial fibrillation, chronic   . 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 Peripheral vascular disease   . Ulcer of ankle 05/2014    left and right ankle  . Heart failure   . Pulmonary edema     Past Surgical History  Procedure Laterality Date  . Blt    . Abdominal hysterectomy    . Coronary angioplasty    . Ct of abd    . Hip pinning,cannulated  11/19/2011    Procedure: CANNULATED HIP PINNING;  Surgeon: Sanjuana Kava, MD;  Location: [facility];  Service: Orthopedics;  Laterality: Right;  . Fasciotomy  11/29/2011    Procedure: FASCIOTOMY;  Surgeon: Carole Civil, MD;  Location: [facility];  Service: Orthopedics;  Laterality: Right;  right thigh   . Dressing change under anesthesia  12/02/2011    Procedure: DRESSING CHANGE UNDER ANESTHESIA;  Surgeon: Carole Civil, MD;  Location: [facility];  Service: Orthopedics;  Laterality: Right;  .  Endarterectomy femoral Right 09/15/2012    Procedure: ENDARTERECTOMY FEMORAL;  Surgeon: Mal Misty, MD;  Location: [place];  Service: Vascular;  Laterality: Right;  . Femoral-popliteal bypass graft Right 09/15/2012    Procedure: BYPASS GRAFT FEMORAL-POPLITEAL ARTERY;  Surgeon: Mal Misty, MD;  Location: [place];  Service: Vascular;  Laterality: Right;  . Patch angioplasty Right 09/15/2012    Procedure: PATCH ANGIOPLASTY;  Surgeon: Mal Misty, MD;  Location: [place];  Service: Vascular;  Laterality: Right;  . Abdominal aortagram N/A 09/14/2012    Procedure: ABDOMINAL [name];  Surgeon: Serafina Mitchell, MD;  Location: [facility];  Service: Cardiovascular;  Laterality: N/A;     History   Social History  . Marital Status: Married    Spouse Name: N/A  . Number of Children: N/A  . Years of Education: N/A   Occupational History  . Not on file.   Social History Main Topics  . Smoking status: Never Smoker   . Smokeless tobacco: Never Used  . Alcohol Use: No  . Drug Use: No  . Sexual Activity: No   Other Topics Concern  . Not on file   Social History Narrative     Filed Vitals:   12/29/14 1405  BP: 116/68  Pulse: 92  Height: 6' (1.829 m)  Weight: 179 lb (81.194 kg)  SpO2: 94%    PHYSICAL EXAM General: NAD Neck: No JVD, no thyromegaly. Lungs: Diminished but clear to auscultation bilaterally with normal respiratory effort. CV: Nondisplaced PMI. Irregular rhythm, normal S1/S2, no S3, no murmur. No pretibial or periankle edema. No carotid bruit. Bilateral venous varicosities.  Abdomen: Soft, nontender, no distention.  Neurologic: Alert and oriented x 3.  Psych: Normal affect. Skin: Normal. Extremities: No clubbing or cyanosis.  Musculoskeletal: No gross deformities.    ECG: Most recent ECG reviewed.      ASSESSMENT AND PLAN: 1. Chronic atrial fibrillation: Symptomatically stable. Rate is controlled on current doses of metoprolol and diltiazem. Will continue warfarin.   2. Essential HTN: Controlled on current therapy. No changes.  3. PVD: Should follow up with vascular surgery. On ASA.  4. Chronic diastolic heart failure: Euvolemic and stable. No changes to diuretic regimen, torsemide 20 mg daily.  Dispo: f/u 6 months.   Kate Sable, M.D., F.A.C.C.

## 2014-12-29 NOTE — Patient Instructions (Signed)
Your physician wants you to follow-up in: 6 months You will receive a reminder letter in the mail two months in advance. If you don't receive a letter, please call our office to schedule the follow-up appointment.     Your physician recommends that you continue on your current medications as directed. Please refer to the Current Medication list given to you today.      Thank you for choosing [facility] !        

## 2015-01-01 ENCOUNTER — Encounter (HOSPITAL_COMMUNITY): Payer: Self-pay | Admitting: Hematology & Oncology

## 2015-01-04 ENCOUNTER — Encounter (HOSPITAL_COMMUNITY): Payer: Medicare Other | Attending: Hematology and Oncology

## 2015-01-04 DIAGNOSIS — D45 Polycythemia vera: Secondary | ICD-10-CM

## 2015-01-04 DIAGNOSIS — D47Z9 Other specified neoplasms of uncertain behavior of lymphoid, hematopoietic and related tissue: Secondary | ICD-10-CM

## 2015-01-04 LAB — CBC WITH DIFFERENTIAL/PLATELET
Basophils Absolute: 0.2 10*3/uL — ABNORMAL HIGH (ref 0.0–0.1)
Basophils Relative: 3 % — ABNORMAL HIGH (ref 0–1)
EOS ABS: 0.1 10*3/uL (ref 0.0–0.7)
Eosinophils Relative: 2 % (ref 0–5)
HCT: 46.1 % — ABNORMAL HIGH (ref 36.0–46.0)
Hemoglobin: 14.2 g/dL (ref 12.0–15.0)
Lymphocytes Relative: 9 % — ABNORMAL LOW (ref 12–46)
Lymphs Abs: 0.9 10*3/uL (ref 0.7–4.0)
MCH: 27.8 pg (ref 26.0–34.0)
MCHC: 30.8 g/dL (ref 30.0–36.0)
MCV: 90.2 fL (ref 78.0–100.0)
MONO ABS: 0.3 10*3/uL (ref 0.1–1.0)
Monocytes Relative: 3 % (ref 3–12)
NEUTROS ABS: 8 10*3/uL — AB (ref 1.7–7.7)
NEUTROS PCT: 84 % — AB (ref 43–77)
Platelets: 196 10*3/uL (ref 150–400)
RBC: 5.11 MIL/uL (ref 3.87–5.11)
RDW: 26.6 % — AB (ref 11.5–15.5)
WBC: 9.6 10*3/uL (ref 4.0–10.5)

## 2015-01-04 NOTE — Progress Notes (Signed)
LABS DRAWN

## 2015-01-15 ENCOUNTER — Ambulatory Visit (INDEPENDENT_AMBULATORY_CARE_PROVIDER_SITE_OTHER): Payer: Medicare Other | Admitting: *Deleted

## 2015-01-15 DIAGNOSIS — Z5181 Encounter for therapeutic drug level monitoring: Secondary | ICD-10-CM

## 2015-01-15 DIAGNOSIS — I482 Chronic atrial fibrillation, unspecified: Secondary | ICD-10-CM

## 2015-01-15 LAB — POCT INR: INR: 3.1

## 2015-01-23 ENCOUNTER — Encounter (HOSPITAL_BASED_OUTPATIENT_CLINIC_OR_DEPARTMENT_OTHER): Payer: Medicare Other | Admitting: Oncology

## 2015-01-23 ENCOUNTER — Encounter (HOSPITAL_BASED_OUTPATIENT_CLINIC_OR_DEPARTMENT_OTHER): Payer: Medicare Other

## 2015-01-23 ENCOUNTER — Encounter (HOSPITAL_COMMUNITY): Payer: Self-pay | Admitting: Oncology

## 2015-01-23 VITALS — BP 109/68 | HR 71 | Temp 97.8°F | Resp 18 | Wt 175.3 lb

## 2015-01-23 DIAGNOSIS — D45 Polycythemia vera: Secondary | ICD-10-CM

## 2015-01-23 NOTE — Patient Instructions (Addendum)
[place] at [facility] Discharge Instructions  RECOMMENDATIONS MADE BY THE CONSULTANT AND ANY TEST RESULTS WILL BE SENT TO YOUR REFERRING PHYSICIAN.  Exam completed by Kirby Crigler today Lab work is still pending today we will call with the results Lab work every 2 weeks Return to see the doctor in 8 weeks Please call the clinic if you have any questions or concerns  Thank you for choosing [place] at [facility] to provide your oncology and hematology care.  To afford each patient quality time with our provider, please arrive at least 15 minutes before your scheduled appointment time.    You need to re-schedule your appointment should you arrive 10 or more minutes late.  We strive to give you quality time with our providers, and arriving late affects you and other patients whose appointments are after yours.  Also, if you no show three or more times for appointments you may be dismissed from the clinic at the providers discretion.     Again, thank you for choosing [facility].  Our hope is that these requests will decrease the amount of time that you wait before being seen by our physicians.       _____________________________________________________________  Should you have questions after your visit to [facility], please contact our office at [phone] between the hours of 8:30 a.m. and 4:30 p.m.  Voicemails left after 4:30 p.m. will not be returned until the following business day.  For prescription refill requests, have your pharmacy contact our office.

## 2015-01-23 NOTE — Assessment & Plan Note (Addendum)
JAK 2 positive MPD, P. Vera. Currently on Hydrea 500 mg BID after an interruption in the past secondary to symptomatic pancytopenia.  Counts normalized with cessation of Hydrea, but thrombocytosis was noted.  She is not a candidate for Anagrelide given her cardiac issue(s).  Hydrea was restarted with a slow titration.  Labs today: CBC diff.  Labs every 2 weeks: CBC diff  "Can I stop taking Torsemide?"  The answer is no according to Dr. Court Joy note and this is relayed to the patient.  Return in 6-8  weeks for follow-up.  Continue same dose of Hydrea (500 mg BID).

## 2015-01-23 NOTE — Progress Notes (Signed)
Charlotte Kilts, MD [place] [id number]  Polycythemia vera  CURRENT THERAPY: Hydrea 500 mg BID (after interruption in past with symptomatic pancytopenia)  INTERVAL HISTORY: Charlotte [name] 74 y.o. female returns for followup of JAK 2 positive MPD, P. Vera.   I personally reviewed and went over laboratory results with the patient.  The results are noted within this dictation.  Most recent labs are pending at the time of this dictation.  Chart reviewed.  Dr. Court Joy note appreciated and reviewed: 1. Chronic atrial fibrillation: Symptomatically stable. Rate is controlled on current doses of metoprolol and diltiazem. Will continue warfarin.  2. Essential HTN: Controlled on current therapy. No changes. 3. PVD: Should follow up with vascular surgery. On ASA. 4. Chronic diastolic heart failure: Euvolemic and stable. No changes to diuretic regimen, torsemide 20 mg daily. Dispo: f/u 6 months.  "Can I stop my Torsemide?"  The answer is no and this is relayed to the patient in no confusing terms.   She reports her energy level is improved.  She denies any active complaints today.  Past Medical History  Diagnosis Date  . Hypertension   . Arthritis   . Varicose veins   . Deaf   . Elevated WBC count     and platelets  . Thyroid disease   . Irregular heart rate   . 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 Peripheral neuropathy     feet  . 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 Atrial fibrillation, chronic   . 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 Peripheral vascular disease   . Ulcer of ankle 05/2014    left and right ankle  . Heart failure   . 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     has No Known Allergies.  Current Outpatient Prescriptions on File Prior to Visit  Medication Sig Dispense Refill  . aspirin EC 81 MG tablet Take 81 mg by mouth daily.    . Cholecalciferol (VITAMIN D3) 5000 UNITS CAPS Take 1 capsule by mouth daily.    Marland Kitchen diltiazem (CARDIZEM CD) 300 MG 24 hr capsule Take 1 capsule (300 mg total) by mouth daily. 30 capsule 1  . gabapentin (NEURONTIN) 100 MG capsule Take 100 mg by mouth 3 (three) times daily.      . hydroxyurea (HYDREA) 500 MG capsule Take 500 mg by mouth daily. May take with food to minimize GI side effects.  Take one in morning one in  afternoon Monday through friday    . levothyroxine (SYNTHROID, LEVOTHROID) 75 MCG tablet     . metoprolol (TOPROL-XL) 100 MG 24 hr tablet Take 100 mg by mouth 2 (two) times daily.      . Potassium Chloride ER 20 MEQ TBCR Take 20 mEq by mouth daily. 30 tablet 1  . torsemide (DEMADEX) 20 MG tablet Take 1 tablet (  20 mg total) by mouth daily. 60 tablet 1  . warfarin (COUMADIN) 5 MG tablet Take 1 tablet daily except 1/2 tablet on Mondays, Wednesdays and Fridays (Patient taking differently: Take 2.5-5 mg by mouth See admin instructions. Take 1 tablet daily except 1/2 tablet on Mondays, Wednesdays, Fridays) 30 tablet 6   No current facility-administered medications on file prior to visit.    Past Surgical History  Procedure Laterality Date  . Blt    . Abdominal hysterectomy    . Coronary  angioplasty    . Ct of abd    . Hip pinning,cannulated  11/19/2011    Procedure: CANNULATED HIP PINNING;  Surgeon: Sanjuana Kava, MD;  Location: [facility];  Service: Orthopedics;  Laterality: Right;  . Fasciotomy  11/29/2011    Procedure: FASCIOTOMY;  Surgeon: Carole Civil, MD;  Location: [facility];  Service: Orthopedics;  Laterality: Right;  right thigh   . Dressing change under anesthesia  12/02/2011    Procedure: DRESSING CHANGE UNDER ANESTHESIA;  Surgeon: Carole Civil, MD;  Location: [facility];  Service: Orthopedics;  Laterality: Right;  . Endarterectomy femoral Right 09/15/2012    Procedure: ENDARTERECTOMY FEMORAL;  Surgeon: Mal Misty, MD;  Location: [place];  Service: Vascular;  Laterality: Right;  . Femoral-popliteal bypass graft Right 09/15/2012    Procedure: BYPASS GRAFT FEMORAL-POPLITEAL ARTERY;  Surgeon: Mal Misty, MD;  Location: [place];  Service: Vascular;  Laterality: Right;  . Patch angioplasty Right 09/15/2012    Procedure: PATCH ANGIOPLASTY;  Surgeon: Mal Misty, MD;  Location: [place];  Service: Vascular;  Laterality: Right;  . Abdominal aortagram N/A 09/14/2012    Procedure: ABDOMINAL [name];  Surgeon: Serafina Mitchell, MD;  Location: [facility] C[facility] LAB;  Service: Cardiovascular;  Laterality: N/A;    Denies any headaches, dizziness, double vision, fevers, chills, night sweats, nausea, vomiting, diarrhea, constipation, chest pain, heart palpitations, shortness of breath, blood in stool, black tarry stool, urinary pain, urinary burning, urinary frequency, hematuria.   PHYSICAL EXAMINATION  ECOG PERFORMANCE STATUS: 1 - Symptomatic but completely ambulatory  Filed Vitals:   01/23/15 1326  BP: 109/68  Pulse: 71  Temp: 97.8 F (36.6 C)  Resp: 18    GENERAL:alert, no distress, well nourished, well developed, comfortable, cooperative, smiling and accompanied by husband. SKIN: skin color, texture, turgor are normal, no rashes or significant lesions HEAD: Normocephalic, No  masses, lesions, tenderness or abnormalities EYES: normal, PERRLA, EOMI, Conjunctiva are pink and non-injected EARS: External ears normal OROPHARYNX:lips, buccal mucosa, and tongue normal and mucous membranes are moist  NECK: supple, no adenopathy, thyroid normal size, non-tender, without nodularity, trachea midline LYMPH:  no palpable lymphadenopathy BREAST:not examined LUNGS: clear to auscultation and percussion HEART: irregularly irregular ABDOMEN:abdomen soft, non-tender and normal bowel sounds BACK: Back symmetric, no curvature. EXTREMITIES:less then 2 second capillary refill, no joint deformities, effusion, or inflammation, no skin discoloration, no cyanosis  NEURO: alert & oriented x 3 with fluent speech, no focal motor/sensory deficits   LABORATORY DATA: CBC    Component Value Date/Time   WBC 9.6 01/04/2015 0931   RBC 5.11 01/04/2015 0931   RBC 3.48* 08/19/2013 0828   HGB 14.2 01/04/2015 0931   HCT 46.1* 01/04/2015 0931   PLT 196 01/04/2015 0931   MCV 90.2 01/04/2015 0931   MCH 27.8 01/04/2015 0931   MCHC 30.8 01/04/2015 0931   RDW 26.6* 01/04/2015 0931   LYMPHSABS 0.9 01/04/2015 0931   MONOABS 0.3 01/04/2015 0931   EOSABS 0.1 01/04/2015  0931   BASOSABS 0.2* 01/04/2015 0931      Chemistry      Component Value Date/Time   NA 140 12/21/2014 1104   K 4.7 12/21/2014 1104   CL 105 12/21/2014 1104   CO2 26 12/21/2014 1104   BUN 18 12/21/2014 1104   CREATININE 1.04* 12/21/2014 1104   CREATININE 1.07 12/04/2014 1248      Component Value Date/Time   CALCIUM 8.9 12/21/2014 1104   ALKPHOS 96 12/21/2014 1104   AST 18 12/21/2014 1104   ALT 10* 12/21/2014 1104   BILITOT 1.5* 12/21/2014 1104       ASSESSMENT AND PLAN:  Polycythemia vera JAK 2 positive MPD, P. Vera. Currently on Hydrea 500 mg BID after an interruption in the past secondary to symptomatic pancytopenia.  Counts normalized with cessation of Hydrea, but thrombocytosis was noted.  She is not a candidate  for Anagrelide given her cardiac issue(s).  Hydrea was restarted with a slow titration.  Labs today: CBC diff.  Labs in 4 weeks: CBC diff  "Can I stop taking Torsemide?"  The answer is no according to Dr. Court Joy note and this is relayed to the patient.  Return in 4 weeks for follow-up.  Continue same dose of Hydrea (500 mg BID).    THERAPY PLAN:  Continue wit Hydrea as prescribed.  Will adjust dose if necessary.  All questions were answered. The patient knows to call the clinic with any problems, questions or concerns. We can certainly see the patient much sooner if necessary.  Patient and plan discussed with Dr. Ancil Linsey and she is in agreement with the aforementioned.   This note is electronically signed by: Doy Mince 01/23/2015 1:38 PM

## 2015-01-24 ENCOUNTER — Telehealth (HOSPITAL_COMMUNITY): Payer: Self-pay | Admitting: Emergency Medicine

## 2015-01-24 LAB — CBC WITH DIFFERENTIAL/PLATELET
Basophils Absolute: 0.2 10*3/uL — ABNORMAL HIGH (ref 0.0–0.1)
Basophils Relative: 2 % — ABNORMAL HIGH (ref 0–1)
Eosinophils Absolute: 0.1 10*3/uL (ref 0.0–0.7)
Eosinophils Relative: 1 % (ref 0–5)
HCT: 46.1 % — ABNORMAL HIGH (ref 36.0–46.0)
Hemoglobin: 14 g/dL (ref 12.0–15.0)
LYMPHS PCT: 9 % — AB (ref 12–46)
Lymphs Abs: 1.2 10*3/uL (ref 0.7–4.0)
MCH: 26.6 pg (ref 26.0–34.0)
MCHC: 30.4 g/dL (ref 30.0–36.0)
MCV: 87.5 fL (ref 78.0–100.0)
Monocytes Absolute: 0.5 10*3/uL (ref 0.1–1.0)
Monocytes Relative: 4 % (ref 3–12)
NEUTROS ABS: 10.9 10*3/uL — AB (ref 1.7–7.7)
Neutrophils Relative %: 85 % — ABNORMAL HIGH (ref 43–77)
PLATELETS: 514 10*3/uL — AB (ref 150–400)
RBC: 5.27 MIL/uL — ABNORMAL HIGH (ref 3.87–5.11)
RDW: 25.7 % — ABNORMAL HIGH (ref 11.5–15.5)
WBC: 12.9 10*3/uL — ABNORMAL HIGH (ref 4.0–10.5)

## 2015-01-24 NOTE — Telephone Encounter (Signed)
Pt advised that she can keep taking the same dose of hydrea, pt verbalized understanding

## 2015-01-24 NOTE — Progress Notes (Signed)
Labs drawn

## 2015-01-24 NOTE — Telephone Encounter (Signed)
-----   Message from Baird Cancer, PA-C sent at 01/23/2015  6:54 PM EDT ----- So can we get a platelet count?  If not, she needs to come back for repeat due to quick decrease in platelets with Hydrea in short amount of time.  I want to make sure she is not thrombocytopenic.  Let me know.

## 2015-02-06 ENCOUNTER — Encounter (HOSPITAL_COMMUNITY): Payer: Medicare Other | Attending: Hematology and Oncology

## 2015-02-06 DIAGNOSIS — D45 Polycythemia vera: Secondary | ICD-10-CM

## 2015-02-06 LAB — CBC WITH DIFFERENTIAL/PLATELET
BASOS PCT: 2 % — AB (ref 0–1)
Basophils Absolute: 0.3 10*3/uL — ABNORMAL HIGH (ref 0.0–0.1)
EOS ABS: 0.2 10*3/uL (ref 0.0–0.7)
Eosinophils Relative: 1 % (ref 0–5)
HCT: 46.2 % — ABNORMAL HIGH (ref 36.0–46.0)
Hemoglobin: 14.2 g/dL (ref 12.0–15.0)
Lymphocytes Relative: 11 % — ABNORMAL LOW (ref 12–46)
Lymphs Abs: 1.5 10*3/uL (ref 0.7–4.0)
MCH: 26.4 pg (ref 26.0–34.0)
MCHC: 30.7 g/dL (ref 30.0–36.0)
MCV: 85.9 fL (ref 78.0–100.0)
Monocytes Absolute: 0.4 10*3/uL (ref 0.1–1.0)
Monocytes Relative: 3 % (ref 3–12)
Neutro Abs: 10.8 10*3/uL — ABNORMAL HIGH (ref 1.7–7.7)
Neutrophils Relative %: 82 % — ABNORMAL HIGH (ref 43–77)
PLATELETS: 348 10*3/uL (ref 150–400)
RBC: 5.38 MIL/uL — ABNORMAL HIGH (ref 3.87–5.11)
RDW: 25.2 % — ABNORMAL HIGH (ref 11.5–15.5)
WBC: 13.2 10*3/uL — ABNORMAL HIGH (ref 4.0–10.5)

## 2015-02-06 NOTE — Progress Notes (Signed)
Labs drawn

## 2015-02-07 ENCOUNTER — Ambulatory Visit (INDEPENDENT_AMBULATORY_CARE_PROVIDER_SITE_OTHER): Payer: Medicare Other | Admitting: *Deleted

## 2015-02-07 DIAGNOSIS — I482 Chronic atrial fibrillation, unspecified: Secondary | ICD-10-CM

## 2015-02-07 DIAGNOSIS — Z5181 Encounter for therapeutic drug level monitoring: Secondary | ICD-10-CM

## 2015-02-07 LAB — POCT INR: INR: 3.4

## 2015-02-16 ENCOUNTER — Encounter: Payer: Self-pay | Admitting: *Deleted

## 2015-02-20 ENCOUNTER — Encounter (HOSPITAL_BASED_OUTPATIENT_CLINIC_OR_DEPARTMENT_OTHER): Payer: Medicare Other

## 2015-02-20 ENCOUNTER — Telehealth (HOSPITAL_COMMUNITY): Payer: Self-pay | Admitting: Emergency Medicine

## 2015-02-20 DIAGNOSIS — D45 Polycythemia vera: Secondary | ICD-10-CM | POA: Diagnosis present

## 2015-02-20 LAB — CBC WITH DIFFERENTIAL/PLATELET
Basophils Absolute: 0.2 10*3/uL — ABNORMAL HIGH (ref 0.0–0.1)
Basophils Relative: 2 % — ABNORMAL HIGH (ref 0–1)
Eosinophils Absolute: 0.2 10*3/uL (ref 0.0–0.7)
Eosinophils Relative: 1 % (ref 0–5)
HCT: 47.2 % — ABNORMAL HIGH (ref 36.0–46.0)
Hemoglobin: 14.4 g/dL (ref 12.0–15.0)
Lymphocytes Relative: 11 % — ABNORMAL LOW (ref 12–46)
Lymphs Abs: 1.2 10*3/uL (ref 0.7–4.0)
MCH: 25.7 pg — AB (ref 26.0–34.0)
MCHC: 30.5 g/dL (ref 30.0–36.0)
MCV: 84.1 fL (ref 78.0–100.0)
Monocytes Absolute: 0.4 10*3/uL (ref 0.1–1.0)
Monocytes Relative: 4 % (ref 3–12)
NEUTROS ABS: 8.9 10*3/uL — AB (ref 1.7–7.7)
Neutrophils Relative %: 82 % — ABNORMAL HIGH (ref 43–77)
PLATELETS: 432 10*3/uL — AB (ref 150–400)
RBC: 5.61 MIL/uL — AB (ref 3.87–5.11)
RDW: 24.9 % — ABNORMAL HIGH (ref 11.5–15.5)
WBC: 10.9 10*3/uL — AB (ref 4.0–10.5)

## 2015-02-20 NOTE — Telephone Encounter (Signed)
-----   Message from Baird Cancer, PA-C sent at 02/20/2015  3:32 PM EDT ----- Stable.  If HCT is > 45% with next lab test, she will need a phlebotomy.  Labs as scheduled

## 2015-02-20 NOTE — Telephone Encounter (Signed)
Notified pt of lab results 

## 2015-02-21 NOTE — Progress Notes (Signed)
Labs drawn

## 2015-02-22 ENCOUNTER — Encounter: Payer: Self-pay | Admitting: Internal Medicine

## 2015-03-05 ENCOUNTER — Ambulatory Visit (INDEPENDENT_AMBULATORY_CARE_PROVIDER_SITE_OTHER): Payer: Medicare Other | Admitting: *Deleted

## 2015-03-05 DIAGNOSIS — I482 Chronic atrial fibrillation, unspecified: Secondary | ICD-10-CM

## 2015-03-05 DIAGNOSIS — Z5181 Encounter for therapeutic drug level monitoring: Secondary | ICD-10-CM

## 2015-03-05 LAB — POCT INR: INR: 1.7

## 2015-03-06 ENCOUNTER — Encounter (HOSPITAL_COMMUNITY): Payer: Medicare Other | Attending: Hematology and Oncology

## 2015-03-06 DIAGNOSIS — D45 Polycythemia vera: Secondary | ICD-10-CM | POA: Diagnosis not present

## 2015-03-06 LAB — CBC WITH DIFFERENTIAL/PLATELET
BASOS PCT: 2 % — AB (ref 0–1)
Basophils Absolute: 0.2 10*3/uL — ABNORMAL HIGH (ref 0.0–0.1)
EOS ABS: 0.1 10*3/uL (ref 0.0–0.7)
EOS PCT: 1 % (ref 0–5)
HEMATOCRIT: 45.9 % (ref 36.0–46.0)
Hemoglobin: 14.1 g/dL (ref 12.0–15.0)
LYMPHS ABS: 1.4 10*3/uL (ref 0.7–4.0)
Lymphocytes Relative: 11 % — ABNORMAL LOW (ref 12–46)
MCH: 25.8 pg — ABNORMAL LOW (ref 26.0–34.0)
MCHC: 30.7 g/dL (ref 30.0–36.0)
MCV: 84.1 fL (ref 78.0–100.0)
MONOS PCT: 3 % (ref 3–12)
Monocytes Absolute: 0.4 10*3/uL (ref 0.1–1.0)
NEUTROS PCT: 83 % — AB (ref 43–77)
Neutro Abs: 9.9 10*3/uL — ABNORMAL HIGH (ref 1.7–7.7)
Platelets: 424 10*3/uL — ABNORMAL HIGH (ref 150–400)
RBC: 5.46 MIL/uL — ABNORMAL HIGH (ref 3.87–5.11)
RDW: 24.4 % — ABNORMAL HIGH (ref 11.5–15.5)
WBC: 12 10*3/uL — AB (ref 4.0–10.5)

## 2015-03-06 NOTE — Progress Notes (Signed)
..  Charlotte [name]'s reason for visit today are for labs as scheduled per MD orders.  Venipuncture performed with a 23 gauge butterfly needle to R Antecubital.  Charlotte [name] tolerated venipuncture well and without incident; questions were answered and patient was discharged.

## 2015-03-20 ENCOUNTER — Ambulatory Visit (HOSPITAL_COMMUNITY): Payer: Medicare Other | Admitting: Oncology

## 2015-03-20 ENCOUNTER — Encounter (HOSPITAL_COMMUNITY): Payer: Medicare Other

## 2015-03-22 ENCOUNTER — Encounter (HOSPITAL_BASED_OUTPATIENT_CLINIC_OR_DEPARTMENT_OTHER): Payer: Medicare Other | Admitting: Oncology

## 2015-03-22 ENCOUNTER — Encounter (HOSPITAL_COMMUNITY): Payer: Self-pay | Admitting: Oncology

## 2015-03-22 ENCOUNTER — Encounter (HOSPITAL_COMMUNITY): Payer: Medicare Other

## 2015-03-22 VITALS — BP 131/63 | HR 67 | Temp 97.6°F | Resp 18 | Wt 176.0 lb

## 2015-03-22 DIAGNOSIS — D45 Polycythemia vera: Secondary | ICD-10-CM

## 2015-03-22 LAB — CBC WITH DIFFERENTIAL/PLATELET
BASOS ABS: 0.3 10*3/uL — AB (ref 0.0–0.1)
Basophils Relative: 2 % — ABNORMAL HIGH (ref 0–1)
Eosinophils Absolute: 0.2 10*3/uL (ref 0.0–0.7)
Eosinophils Relative: 1 % (ref 0–5)
HEMATOCRIT: 44.5 % (ref 36.0–46.0)
HEMOGLOBIN: 13.4 g/dL (ref 12.0–15.0)
LYMPHS PCT: 7 % — AB (ref 12–46)
Lymphs Abs: 1.2 10*3/uL (ref 0.7–4.0)
MCH: 26.1 pg (ref 26.0–34.0)
MCHC: 30.1 g/dL (ref 30.0–36.0)
MCV: 86.6 fL (ref 78.0–100.0)
Monocytes Absolute: 0.4 10*3/uL (ref 0.1–1.0)
Monocytes Relative: 2 % — ABNORMAL LOW (ref 3–12)
NEUTROS ABS: 13.8 10*3/uL — AB (ref 1.7–7.7)
NEUTROS PCT: 87 % — AB (ref 43–77)
PLATELETS: 354 10*3/uL (ref 150–400)
RBC: 5.14 MIL/uL — AB (ref 3.87–5.11)
RDW: 23.8 % — ABNORMAL HIGH (ref 11.5–15.5)
WBC: 15.8 10*3/uL — AB (ref 4.0–10.5)

## 2015-03-22 NOTE — Patient Instructions (Signed)
[place] at [facility] Discharge Instructions  RECOMMENDATIONS MADE BY THE CONSULTANT AND ANY TEST RESULTS WILL BE SENT TO YOUR REFERRING PHYSICIAN.  Exam and discussion by Robynn Pane, PA-C No changes in hydrea. Call with any concerns or questions.  Labs every 4 weeks and office visit in 12 weeks.  Thank you for choosing [place] at [facility] to provide your oncology and hematology care.  To afford each patient quality time with our provider, please arrive at least 15 minutes before your scheduled appointment time.    You need to re-schedule your appointment should you arrive 10 or more minutes late.  We strive to give you quality time with our providers, and arriving late affects you and other patients whose appointments are after yours.  Also, if you no show three or more times for appointments you may be dismissed from the clinic at the providers discretion.     Again, thank you for choosing [facility].  Our hope is that these requests will decrease the amount of time that you wait before being seen by our physicians.       _____________________________________________________________  Should you have questions after your visit to [facility], please contact our office at [phone] between the hours of 8:30 a.m. and 4:30 p.m.  Voicemails left after 4:30 p.m. will not be returned until the following business day.  For prescription refill requests, have your pharmacy contact our office.

## 2015-03-22 NOTE — Progress Notes (Signed)
[name], MD [place] [id number]  Polycythemia vera  CURRENT THERAPY: Hydrea 500 mg BID (after interruption in past with symptomatic pancytopenia)  INTERVAL HISTORY: Charlotte [name] 74 y.o. female returns for followup of JAK 2 positive MPD, P. Vera.   I personally reviewed and went over laboratory results with the patient.  The results are noted within this dictation.  Labs updated today demonstrating a Hgb WNL and platelet count WNL.  2 weeks ago, she was at [employer] and reached for a menu.  She reports a right shoulder blade pain, which she calls a "muscle strain."  She reports that a few days later, she noted a large ecchymosis on her right side, encompassing the inferior portion of right breast, right hip, right pelvic area, and right sided back.  She notes that it is improving.  Her last INR on 8/2 was 1.7.  She reports that she is due for an INR check on Monday.  She reports her ecchymosis is improving.  She denies any active complaints today.  Past Medical History  Diagnosis Date  . Hypertension   . Arthritis   . Varicose veins   . Deaf   . Elevated WBC count     and platelets  . Thyroid disease   . Irregular heart rate   . 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 Peripheral neuropathy     feet  . 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 Atrial fibrillation, chronic   . 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 Peripheral vascular disease   . Ulcer of ankle 05/2014    left and right ankle  . Heart failure   . Pulmonary edema   . 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     has No Known Allergies.  Current Outpatient Prescriptions on File Prior to Visit  Medication Sig Dispense Refill  . aspirin EC 81 MG tablet Take 81 mg by mouth daily.    . Cholecalciferol (VITAMIN D3) 5000 UNITS CAPS Take 1 capsule by mouth daily.    Marland Kitchen diltiazem (CARDIZEM CD) 300 MG 24 hr capsule Take 1 capsule (300 mg total) by mouth daily. 30 capsule 1  . gabapentin (NEURONTIN) 100 MG capsule Take 100 mg by mouth 3 (three) times daily.      . hydroxyurea (HYDREA) 500 MG capsule Take 500 mg by mouth daily. May take with food to minimize GI side effects.  Take one in morning one in  afternoon Monday through friday    . levothyroxine (SYNTHROID, LEVOTHROID) 75 MCG tablet     . metoprolol (TOPROL-XL) 100 MG 24 hr tablet Take 100 mg by mouth 2 (two) times daily.      . Potassium Chloride ER 20 MEQ TBCR Take 20 mEq by mouth daily. 30 tablet 1  . torsemide (DEMADEX) 20 MG tablet Take  1 tablet (20 mg total) by mouth daily. 60 tablet 1  . warfarin (COUMADIN) 5 MG tablet Take 1 tablet daily except 1/2 tablet on Mondays, Wednesdays and Fridays (Patient taking differently: Take 2.5-5 mg by mouth See admin instructions. Take 1 tablet daily except 1/2 tablet on Mondays, Wednesdays, Fridays) 30 tablet 6   No current facility-administered medications on file prior to visit.    Past Surgical History  Procedure Laterality Date  . Blt    . Abdominal hysterectomy    . Coronary angioplasty    . Ct of abd    . Hip pinning,cannulated  11/19/2011    Procedure: CANNULATED  HIP PINNING;  Surgeon: Sanjuana Kava, MD;  Location: [facility];  Service: Orthopedics;  Laterality: Right;  . Fasciotomy  11/29/2011    Procedure: FASCIOTOMY;  Surgeon: Carole Civil, MD;  Location: [facility];  Service: Orthopedics;  Laterality: Right;  right thigh   . Dressing change under anesthesia  12/02/2011    Procedure: DRESSING CHANGE UNDER ANESTHESIA;  Surgeon: Carole Civil, MD;  Location: [facility];  Service: Orthopedics;  Laterality: Right;  . Endarterectomy femoral Right 09/15/2012    Procedure: ENDARTERECTOMY FEMORAL;  Surgeon: Mal Misty, MD;  Location: [place];  Service: Vascular;  Laterality: Right;  . Femoral-popliteal bypass graft Right 09/15/2012    Procedure: BYPASS GRAFT FEMORAL-POPLITEAL ARTERY;  Surgeon: Mal Misty, MD;  Location: [place];  Service: Vascular;  Laterality: Right;  . Patch angioplasty Right 09/15/2012    Procedure: PATCH ANGIOPLASTY;  Surgeon: Mal Misty, MD;  Location: [place];  Service: Vascular;  Laterality: Right;  . Abdominal aortagram N/A 09/14/2012    Procedure: ABDOMINAL [name];  Surgeon: Serafina Mitchell, MD;  Location: [facility] CATH LAB;  Service: Cardiovascular;  Laterality: N/A;    Denies any headaches, dizziness, double vision, fevers, chills, night sweats, nausea, vomiting, diarrhea, constipation, chest pain, heart palpitations, shortness of breath, blood in stool, black tarry stool, urinary pain, urinary burning, urinary frequency, hematuria.   PHYSICAL EXAMINATION  ECOG PERFORMANCE STATUS: 1 - Symptomatic but completely ambulatory  Filed Vitals:   03/22/15 1301  BP: 131/63  Pulse: 67  Temp: 97.6 F (36.4 C)  Resp: 18    GENERAL:alert, no distress, well nourished, well developed, comfortable, cooperative, smiling and accompanied by husband. SKIN: skin color, texture, turgor are normal, no rashes or significant lesions, large right sided ecchymosis encompassing the inferior portion of right breast, large portion of right back, right  pelvis, and right hip. HEAD: Normocephalic, No masses, lesions, tenderness or abnormalities EYES: normal, PERRLA, EOMI, Conjunctiva are pink and non-injected EARS: External ears normal OROPHARYNX:lips, buccal mucosa, and tongue normal and mucous membranes are moist  NECK: supple, no adenopathy, thyroid normal size, non-tender, without nodularity, trachea midline LYMPH:  no palpable lymphadenopathy BREAST:not examined LUNGS: clear to auscultation and percussion HEART: irregularly irregular ABDOMEN:abdomen soft, non-tender and normal bowel sounds BACK: Back symmetric, no curvature. EXTREMITIES:less then 2 second capillary refill, no joint deformities, effusion, or inflammation, no skin discoloration, no cyanosis  NEURO: alert & oriented x 3 with fluent speech, no focal motor/sensory deficits   LABORATORY DATA: CBC    Component Value Date/Time   WBC 15.8* 03/22/2015 1231   RBC 5.14* 03/22/2015 1231   RBC 3.48* 08/19/2013 0828   HGB 13.4 03/22/2015 1231   HCT 44.5 03/22/2015 1231   PLT 354 03/22/2015 1231   MCV 86.6 03/22/2015 1231   MCH 26.1 03/22/2015 1231   MCHC 30.1 03/22/2015 1231  RDW 23.8* 03/22/2015 1231   LYMPHSABS 1.2 03/22/2015 1231   MONOABS 0.4 03/22/2015 1231   EOSABS 0.2 03/22/2015 1231   BASOSABS 0.3* 03/22/2015 1231      Chemistry      Component Value Date/Time   NA 140 12/21/2014 1104   K 4.7 12/21/2014 1104   CL 105 12/21/2014 1104   CO2 26 12/21/2014 1104   BUN 18 12/21/2014 1104   CREATININE 1.04* 12/21/2014 1104   CREATININE 1.07 12/04/2014 1248      Component Value Date/Time   CALCIUM 8.9 12/21/2014 1104   ALKPHOS 96 12/21/2014 1104   AST 18 12/21/2014 1104   ALT 10* 12/21/2014 1104   BILITOT 1.5* 12/21/2014 1104       ASSESSMENT AND PLAN:  Polycythemia vera JAK 2 positive MPD, P. Vera. Currently on Hydrea 500 mg BID after an interruption in the past secondary to symptomatic pancytopenia.  Counts normalized with cessation of Hydrea, but  thrombocytosis was noted.  She is not a candidate for Anagrelide given her cardiac issue(s).  Hydrea was restarted with a slow titration.  Labs today: CBC diff.  Labs every 4 weeks: CBC diff  INR check on Monday as scheduled.  Ecchymosis is improving and therefore concern for supratherapeutic INR is less suspicious.    Return in 12  weeks for follow-up.  Continue same dose of Hydrea (500 mg BID).  Will adjust dose if necessary.   THERAPY PLAN:  Continue with Hydrea as prescribed.  Will adjust dose if necessary.  All questions were answered. The patient knows to call the clinic with any problems, questions or concerns. We can certainly see the patient much sooner if necessary.  Patient and plan discussed with Dr. Ancil Linsey and she is in agreement with the aforementioned.   This note is electronically signed by: Doy Mince 03/22/2015 1:37 PM

## 2015-03-22 NOTE — Assessment & Plan Note (Addendum)
JAK 2 positive MPD, P. Vera. Currently on Hydrea 500 mg BID after an interruption in the past secondary to symptomatic pancytopenia.  Counts normalized with cessation of Hydrea, but thrombocytosis was noted.  She is not a candidate for Anagrelide given her cardiac issue(s).  Hydrea was restarted with a slow titration.  Labs today: CBC diff.  Labs every 4 weeks: CBC diff  INR check on Monday as scheduled.  Ecchymosis is improving and therefore concern for supratherapeutic INR is less suspicious.    Return in 12  weeks for follow-up.  Continue same dose of Hydrea (500 mg BID).  Will adjust dose if necessary.

## 2015-03-26 ENCOUNTER — Ambulatory Visit (INDEPENDENT_AMBULATORY_CARE_PROVIDER_SITE_OTHER): Payer: Medicare Other | Admitting: *Deleted

## 2015-03-26 DIAGNOSIS — I482 Chronic atrial fibrillation, unspecified: Secondary | ICD-10-CM

## 2015-03-26 DIAGNOSIS — Z5181 Encounter for therapeutic drug level monitoring: Secondary | ICD-10-CM | POA: Diagnosis not present

## 2015-03-26 LAB — POCT INR: INR: 2.3

## 2015-03-27 DIAGNOSIS — Z1389 Encounter for screening for other disorder: Secondary | ICD-10-CM | POA: Diagnosis not present

## 2015-03-27 DIAGNOSIS — Z6824 Body mass index (BMI) 24.0-24.9, adult: Secondary | ICD-10-CM | POA: Diagnosis not present

## 2015-03-27 DIAGNOSIS — I509 Heart failure, unspecified: Secondary | ICD-10-CM | POA: Diagnosis not present

## 2015-03-27 DIAGNOSIS — R600 Localized edema: Secondary | ICD-10-CM | POA: Diagnosis not present

## 2015-03-27 DIAGNOSIS — E063 Autoimmune thyroiditis: Secondary | ICD-10-CM | POA: Diagnosis not present

## 2015-03-27 DIAGNOSIS — I4891 Unspecified atrial fibrillation: Secondary | ICD-10-CM | POA: Diagnosis not present

## 2015-03-27 DIAGNOSIS — I10 Essential (primary) hypertension: Secondary | ICD-10-CM | POA: Diagnosis not present

## 2015-04-05 ENCOUNTER — Other Ambulatory Visit (HOSPITAL_COMMUNITY): Payer: Self-pay

## 2015-04-05 DIAGNOSIS — D45 Polycythemia vera: Secondary | ICD-10-CM

## 2015-04-19 ENCOUNTER — Encounter (HOSPITAL_COMMUNITY): Payer: Medicare Other | Attending: Hematology and Oncology

## 2015-04-19 DIAGNOSIS — D45 Polycythemia vera: Secondary | ICD-10-CM | POA: Diagnosis not present

## 2015-04-19 LAB — CBC WITH DIFFERENTIAL/PLATELET
BASOS ABS: 0.3 10*3/uL — AB (ref 0.0–0.1)
Basophils Relative: 2 %
Eosinophils Absolute: 0.2 10*3/uL (ref 0.0–0.7)
Eosinophils Relative: 1 %
HEMATOCRIT: 47.4 % — AB (ref 36.0–46.0)
Hemoglobin: 14.6 g/dL (ref 12.0–15.0)
LYMPHS PCT: 9 %
Lymphs Abs: 1.4 10*3/uL (ref 0.7–4.0)
MCH: 26.7 pg (ref 26.0–34.0)
MCHC: 30.8 g/dL (ref 30.0–36.0)
MCV: 86.7 fL (ref 78.0–100.0)
Monocytes Absolute: 0.5 10*3/uL (ref 0.1–1.0)
Monocytes Relative: 3 %
Neutro Abs: 13.7 10*3/uL — ABNORMAL HIGH (ref 1.7–7.7)
Neutrophils Relative %: 85 %
PLATELETS: 449 10*3/uL — AB (ref 150–400)
RBC: 5.47 MIL/uL — AB (ref 3.87–5.11)
RDW: 21.6 % — ABNORMAL HIGH (ref 11.5–15.5)
WBC: 16.2 10*3/uL — AB (ref 4.0–10.5)

## 2015-04-23 ENCOUNTER — Ambulatory Visit (INDEPENDENT_AMBULATORY_CARE_PROVIDER_SITE_OTHER): Payer: Medicare Other | Admitting: *Deleted

## 2015-04-23 DIAGNOSIS — Z5181 Encounter for therapeutic drug level monitoring: Secondary | ICD-10-CM | POA: Diagnosis not present

## 2015-04-23 DIAGNOSIS — I482 Chronic atrial fibrillation, unspecified: Secondary | ICD-10-CM

## 2015-04-23 LAB — POCT INR: INR: 2.3

## 2015-05-14 DIAGNOSIS — Z23 Encounter for immunization: Secondary | ICD-10-CM | POA: Diagnosis not present

## 2015-05-17 ENCOUNTER — Encounter (HOSPITAL_COMMUNITY): Payer: Medicare Other | Attending: Hematology and Oncology

## 2015-05-17 ENCOUNTER — Telehealth (HOSPITAL_COMMUNITY): Payer: Self-pay | Admitting: *Deleted

## 2015-05-17 DIAGNOSIS — D75839 Thrombocytosis, unspecified: Secondary | ICD-10-CM

## 2015-05-17 DIAGNOSIS — D45 Polycythemia vera: Secondary | ICD-10-CM

## 2015-05-17 DIAGNOSIS — D473 Essential (hemorrhagic) thrombocythemia: Secondary | ICD-10-CM

## 2015-05-17 LAB — CBC WITH DIFFERENTIAL/PLATELET
Basophils Absolute: 0.3 10*3/uL — ABNORMAL HIGH (ref 0.0–0.1)
Basophils Relative: 2 %
EOS ABS: 0.2 10*3/uL (ref 0.0–0.7)
Eosinophils Relative: 1 %
HEMATOCRIT: 46.5 % — AB (ref 36.0–46.0)
Hemoglobin: 14.2 g/dL (ref 12.0–15.0)
Lymphocytes Relative: 6 %
Lymphs Abs: 1.1 10*3/uL (ref 0.7–4.0)
MCH: 26.4 pg (ref 26.0–34.0)
MCHC: 30.5 g/dL (ref 30.0–36.0)
MCV: 86.6 fL (ref 78.0–100.0)
MONOS PCT: 3 %
Monocytes Absolute: 0.5 10*3/uL (ref 0.1–1.0)
NEUTROS ABS: 15.1 10*3/uL — AB (ref 1.7–7.7)
Neutrophils Relative %: 87 %
Platelets: 437 10*3/uL — ABNORMAL HIGH (ref 150–400)
RBC: 5.37 MIL/uL — ABNORMAL HIGH (ref 3.87–5.11)
RDW: 21.2 % — ABNORMAL HIGH (ref 11.5–15.5)
WBC: 17.2 10*3/uL — ABNORMAL HIGH (ref 4.0–10.5)

## 2015-05-17 NOTE — Telephone Encounter (Signed)
Patient notified that short stay will be calling her to schedule a therapeutic phlebotomy

## 2015-05-18 ENCOUNTER — Ambulatory Visit (HOSPITAL_COMMUNITY)
Admission: RE | Admit: 2015-05-18 | Discharge: 2015-05-18 | Disposition: A | Discharge status: Home / Self Care | Payer: Medicare Other | Source: Ambulatory Visit | Attending: Hematology & Oncology | Admitting: Hematology & Oncology

## 2015-05-18 VITALS — BP 133/94 | Temp 98.7°F | Resp 16

## 2015-05-18 DIAGNOSIS — D45 Polycythemia vera: Secondary | ICD-10-CM

## 2015-05-18 NOTE — Progress Notes (Signed)
Labs drawn

## 2015-05-18 NOTE — Progress Notes (Signed)
Charlotte [name] presents today for phlebotomy per MD orders. Phlebotomy procedure started at 1038 and ended at 1047. 20oz removed. Patient tolerated procedure well. IV needle removed intact.

## 2015-05-21 ENCOUNTER — Ambulatory Visit (INDEPENDENT_AMBULATORY_CARE_PROVIDER_SITE_OTHER): Payer: Medicare Other | Admitting: *Deleted

## 2015-05-21 DIAGNOSIS — I482 Chronic atrial fibrillation, unspecified: Secondary | ICD-10-CM

## 2015-05-21 DIAGNOSIS — Z5181 Encounter for therapeutic drug level monitoring: Secondary | ICD-10-CM | POA: Diagnosis not present

## 2015-05-21 LAB — POCT INR: INR: 1.8

## 2015-05-22 ENCOUNTER — Other Ambulatory Visit: Payer: Self-pay

## 2015-05-22 MED ORDER — WARFARIN SODIUM 5 MG PO TABS: ORAL_TABLET | ORAL | Status: DC

## 2015-06-05 DIAGNOSIS — I10 Essential (primary) hypertension: Secondary | ICD-10-CM | POA: Diagnosis not present

## 2015-06-05 DIAGNOSIS — E039 Hypothyroidism, unspecified: Secondary | ICD-10-CM | POA: Diagnosis not present

## 2015-06-05 DIAGNOSIS — Z6823 Body mass index (BMI) 23.0-23.9, adult: Secondary | ICD-10-CM | POA: Diagnosis not present

## 2015-06-05 DIAGNOSIS — Z1389 Encounter for screening for other disorder: Secondary | ICD-10-CM | POA: Diagnosis not present

## 2015-06-11 ENCOUNTER — Ambulatory Visit (INDEPENDENT_AMBULATORY_CARE_PROVIDER_SITE_OTHER): Payer: Medicare Other | Admitting: *Deleted

## 2015-06-11 DIAGNOSIS — I482 Chronic atrial fibrillation, unspecified: Secondary | ICD-10-CM

## 2015-06-11 DIAGNOSIS — Z5181 Encounter for therapeutic drug level monitoring: Secondary | ICD-10-CM | POA: Diagnosis not present

## 2015-06-11 LAB — POCT INR: INR: 2.1

## 2015-06-13 DIAGNOSIS — D510 Vitamin B12 deficiency anemia due to intrinsic factor deficiency: Secondary | ICD-10-CM | POA: Diagnosis not present

## 2015-06-14 ENCOUNTER — Encounter (HOSPITAL_BASED_OUTPATIENT_CLINIC_OR_DEPARTMENT_OTHER): Payer: Medicare Other

## 2015-06-14 ENCOUNTER — Encounter (HOSPITAL_COMMUNITY): Payer: Medicare Other | Attending: Oncology | Admitting: Oncology

## 2015-06-14 VITALS — BP 125/64 | HR 62 | Temp 97.7°F | Resp 20 | Wt 180.6 lb

## 2015-06-14 DIAGNOSIS — D45 Polycythemia vera: Secondary | ICD-10-CM

## 2015-06-14 DIAGNOSIS — R0609 Other forms of dyspnea: Secondary | ICD-10-CM | POA: Diagnosis not present

## 2015-06-14 LAB — CBC WITH DIFFERENTIAL/PLATELET
Basophils Absolute: 0.3 10*3/uL — ABNORMAL HIGH (ref 0.0–0.1)
Basophils Relative: 2 %
Eosinophils Absolute: 0.3 10*3/uL (ref 0.0–0.7)
Eosinophils Relative: 2 %
HEMATOCRIT: 44.2 % (ref 36.0–46.0)
HEMOGLOBIN: 13.2 g/dL (ref 12.0–15.0)
LYMPHS PCT: 8 %
Lymphs Abs: 1.4 10*3/uL (ref 0.7–4.0)
MCH: 25.6 pg — ABNORMAL LOW (ref 26.0–34.0)
MCHC: 29.9 g/dL — AB (ref 30.0–36.0)
MCV: 85.7 fL (ref 78.0–100.0)
MONO ABS: 0.4 10*3/uL (ref 0.1–1.0)
MONOS PCT: 2 %
NEUTROS ABS: 14.6 10*3/uL — AB (ref 1.7–7.7)
Neutrophils Relative %: 86 %
Platelets: 345 10*3/uL (ref 150–400)
RBC: 5.16 MIL/uL — ABNORMAL HIGH (ref 3.87–5.11)
RDW: 20.6 % — AB (ref 11.5–15.5)
WBC: 16.9 10*3/uL — ABNORMAL HIGH (ref 4.0–10.5)

## 2015-06-14 MED ORDER — HYDROXYUREA 500 MG PO CAPS: ORAL_CAPSULE | ORAL | Status: DC

## 2015-06-14 NOTE — Patient Instructions (Addendum)
Charlotte [name] at [facility] Discharge Instructions  RECOMMENDATIONS MADE BY THE CONSULTANT AND ANY TEST RESULTS WILL BE SENT TO YOUR REFERRING PHYSICIAN.  Labs every 4 weeks  Continue Hydrea  Hydrea 500mg  twice a day M-F  Return follow up in 3 months to see Charlotte [name]    Thank you for choosing [name] at [facility] to provide your oncology and hematology care.  To afford each patient quality time with our provider, please arrive at least 15 minutes before your scheduled appointment time.    You need to re-schedule your appointment should you arrive 10 or more minutes late.  We strive to give you quality time with our providers, and arriving late affects you and other patients whose appointments are after yours.  Also, if you no show three or more times for appointments you may be dismissed from the clinic at the providers discretion.     Again, thank you for choosing [facility].  Our hope is that these requests will decrease the amount of time that you wait before being seen by our physicians.       _____________________________________________________________  Should you have questions after your visit to [facility], please contact our office at [phone] between the hours of 8:30 a.m. and 4:30 p.m.  Voicemails left after 4:30 p.m. will not be returned until the following business day.  For prescription refill requests, have your pharmacy contact our office.

## 2015-06-14 NOTE — Assessment & Plan Note (Signed)
JAK 2 positive MPD, P. Vera. Currently on Hydrea 500 mg BID after an interruption in the past secondary to symptomatic pancytopenia.  Counts normalized with cessation of Hydrea, but thrombocytosis was noted.  She is not a candidate for Anagrelide given her cardiac issue(s).  Hydrea was restarted with a slow titration.  Labs today: CBC diff.  Labs every 4 weeks: CBC diff  Return in 12  weeks for follow-up.  Continue same dose of Hydrea (500 mg BID).  Will adjust dose if necessary.  Patient is not seen with O2 presently.  She notes that she has O2 at home.  In the clinic her O2 was 81% with walking, 87% with sitting.  She is not in acute distress.  She is smiling.  She denies any complaints.  She will follow-up with cardiology as planned.

## 2015-06-14 NOTE — Progress Notes (Signed)
..  Charlotte [name]'s reason for visit today is for labs as scheduled per MD orders.  Venipuncture performed with a 23 gauge butterfly needle to R Antecubital.  Charlotte [name] tolerated procedure well and without incident; questions were answered and patient was discharged.

## 2015-06-14 NOTE — Progress Notes (Signed)
Charlotte Kilts, MD [place] [id number]  Polycythemia vera [facility]) - Plan: hydroxyurea (HYDREA) 500 MG capsule  CURRENT THERAPY: Hydrea 500 mg BID M-F.  INTERVAL HISTORY: Charlotte [name] 74 y.o. female returns for followup of JAK 2 positive MPD, P. Vera.   I personally reviewed and went over laboratory results with the patient.  The results are noted within this dictation.  Labs updated today demonstrating a Hgb WNL and platelet count WNL with a HCT < 45%.  She reports that her breathing is at baseline.  She notes that her shortness of breath is exacerbated with exertion.  She has O2 at home.  I will keep her cardiologist informed of today's visit.  She has an upcoming appointment with him upcoming.    She denies any chest pain or heart palpations.  She denies any cough.  Past Medical History  Diagnosis Date  . Hypertension   . Arthritis   . Varicose veins   . Deaf   . Elevated WBC count     and platelets  . Thyroid disease   . Irregular heart rate   . 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 Peripheral neuropathy     feet  . 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 Atrial fibrillation, chronic   . 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 Peripheral vascular disease   . Ulcer of ankle 05/2014    left and right ankle  . Heart failure   . Pulmonary edema   . 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); Peripheral vascular disease ([place]); Swollen leg; Pain in limb; Aftercare following surgery of the circulatory system, NEC; Encounter for  therapeutic drug monitoring; Pulmonary edema; Dyspnea; Pleural effusion; Hard of hearing; Thrombocytosis ([place]); Acute heart failure ([place]); Acute pulmonary edema ([place]); Acute respiratory failure with hypoxia ([place]); Chronic diastolic congestive heart failure ([place]); and Pleural effusion on left on her problem list.     has No Known Allergies.  Current Outpatient Prescriptions on File Prior to Visit  Medication Sig Dispense Refill  . aspirin EC 81 MG tablet Take 81 mg by mouth daily.    . Cholecalciferol (VITAMIN D3) 5000 UNITS CAPS Take 1 capsule by mouth daily.    Marland Kitchen diltiazem (CARDIZEM CD) 300 MG 24 hr capsule Take 1 capsule (300 mg total) by mouth daily. 30 capsule 1  . gabapentin (NEURONTIN) 100 MG capsule Take 100 mg by mouth 3 (three) times daily.      Marland Kitchen levothyroxine (SYNTHROID, LEVOTHROID) 75 MCG tablet     . metoprolol (TOPROL-XL) 100 MG 24 hr tablet Take 100 mg by mouth 2 (two) times daily.      . Potassium Chloride ER 20 MEQ TBCR Take 20 mEq by mouth daily. 30 tablet 1  . torsemide (DEMADEX) 20 MG tablet Take 1 tablet (20 mg total) by mouth daily. 60 tablet 1  . warfarin (COUMADIN) 5 MG tablet Take as directed by [facility] 30 tablet 3   No current facility-administered medications on file prior to visit.    Past Surgical History  Procedure Laterality Date  . Blt    . Abdominal hysterectomy    .  Coronary angioplasty    . Ct of abd    . Hip pinning,cannulated  11/19/2011    Procedure: CANNULATED HIP PINNING;  Surgeon: Sanjuana Kava, MD;  Location: [facility];  Service: Orthopedics;  Laterality: Right;  . Fasciotomy  11/29/2011    Procedure: FASCIOTOMY;  Surgeon: Carole Civil, MD;  Location: [facility];  Service: Orthopedics;  Laterality: Right;  right thigh   . Dressing change under anesthesia  12/02/2011    Procedure: DRESSING CHANGE UNDER ANESTHESIA;  Surgeon: Carole Civil, MD;  Location: [facility];  Service: Orthopedics;  Laterality: Right;  . Endarterectomy femoral Right  09/15/2012    Procedure: ENDARTERECTOMY FEMORAL;  Surgeon: Mal Misty, MD;  Location: [place];  Service: Vascular;  Laterality: Right;  . Femoral-popliteal bypass graft Right 09/15/2012    Procedure: BYPASS GRAFT FEMORAL-POPLITEAL ARTERY;  Surgeon: Mal Misty, MD;  Location: [place];  Service: Vascular;  Laterality: Right;  . Patch angioplasty Right 09/15/2012    Procedure: PATCH ANGIOPLASTY;  Surgeon: Mal Misty, MD;  Location: [place];  Service: Vascular;  Laterality: Right;  . Abdominal aortagram N/A 09/14/2012    Procedure: ABDOMINAL [name];  Surgeon: Serafina Mitchell, MD;  Location: [facility] CATH LAB;  Service: Cardiovascular;  Laterality: N/A;    Denies any headaches, dizziness, double vision, fevers, chills, night sweats, nausea, vomiting, diarrhea, constipation, chest pain, heart palpitations, shortness of breath, blood in stool, black tarry stool, urinary pain, urinary burning, urinary frequency, hematuria.   PHYSICAL EXAMINATION  ECOG PERFORMANCE STATUS: 1 - Symptomatic but completely ambulatory  Filed Vitals:   06/14/15 1252  BP: 125/64  Pulse: 62  Temp: 97.7 F (36.5 C)  Resp: 20    GENERAL:alert, no distress, well nourished, well developed, comfortable, cooperative, smiling and accompanied by husband. SKIN: skin color, texture, turgor are normal, no rashes or significant lesions HEAD: Normocephalic, No masses, lesions, tenderness or abnormalities EYES: normal, PERRLA, EOMI, Conjunctiva are pink and non-injected EARS: External ears normal OROPHARYNX:lips, buccal mucosa, and tongue normal and mucous membranes are moist  NECK: supple, no adenopathy, thyroid normal size, non-tender, without nodularity, trachea midline LYMPH:  no palpable lymphadenopathy BREAST:not examined LUNGS: clear to auscultation and percussion HEART: irregularly irregular without murmur ABDOMEN:abdomen soft, non-tender and normal bowel sounds BACK: Back symmetric, no curvature. EXTREMITIES:less then  2 second capillary refill, no joint deformities, effusion, or inflammation, no skin discoloration, no cyanosis  NEURO: alert & oriented x 3 with fluent speech, no focal motor/sensory deficits   LABORATORY DATA: CBC    Component Value Date/Time   WBC 16.9* 06/14/2015 1240   RBC 5.16* 06/14/2015 1240   RBC 3.48* 08/19/2013 0828   HGB 13.2 06/14/2015 1240   HCT 44.2 06/14/2015 1240   PLT 345 06/14/2015 1240   MCV 85.7 06/14/2015 1240   MCH 25.6* 06/14/2015 1240   MCHC 29.9* 06/14/2015 1240   RDW 20.6* 06/14/2015 1240   LYMPHSABS 1.4 06/14/2015 1240   MONOABS 0.4 06/14/2015 1240   EOSABS 0.3 06/14/2015 1240   BASOSABS 0.3* 06/14/2015 1240      Chemistry      Component Value Date/Time   NA 140 12/21/2014 1104   K 4.7 12/21/2014 1104   CL 105 12/21/2014 1104   CO2 26 12/21/2014 1104   BUN 18 12/21/2014 1104   CREATININE 1.04* 12/21/2014 1104   CREATININE 1.07 12/04/2014 1248      Component Value Date/Time   CALCIUM 8.9 12/21/2014 1104   ALKPHOS 96 12/21/2014 1104  AST 18 12/21/2014 1104   ALT 10* 12/21/2014 1104   BILITOT 1.5* 12/21/2014 1104       ASSESSMENT AND PLAN:  Polycythemia vera JAK 2 positive MPD, P. Vera. Currently on Hydrea 500 mg BID after an interruption in the past secondary to symptomatic pancytopenia.  Counts normalized with cessation of Hydrea, but thrombocytosis was noted.  She is not a candidate for Anagrelide given her cardiac issue(s).  Hydrea was restarted with a slow titration.  Labs today: CBC diff.  Labs every 4 weeks: CBC diff  Return in 12  weeks for follow-up.  Continue same dose of Hydrea (500 mg BID).  Will adjust dose if necessary.  Patient is not seen with O2 presently.  She notes that she has O2 at home.  In the clinic her O2 was 81% with walking, 87% with sitting.  She is not in acute distress.  She is smiling.  She denies any complaints.  She will follow-up with cardiology as planned.   THERAPY PLAN:  Continue with Hydrea as  prescribed.  Will adjust dose if necessary.  All questions were answered. The patient knows to call the clinic with any problems, questions or concerns. We can certainly see the patient much sooner if necessary.  Patient and plan discussed with Dr. Ancil Linsey and she is in agreement with the aforementioned.   This note is electronically signed by: Doy Mince 06/14/2015 5:12 PM

## 2015-06-27 ENCOUNTER — Encounter: Payer: Self-pay | Admitting: Cardiovascular Disease

## 2015-06-27 ENCOUNTER — Ambulatory Visit (INDEPENDENT_AMBULATORY_CARE_PROVIDER_SITE_OTHER): Payer: Medicare Other | Admitting: Cardiovascular Disease

## 2015-06-27 VITALS — BP 128/80 | HR 84 | Ht 72.0 in | Wt 175.6 lb

## 2015-06-27 DIAGNOSIS — I10 Essential (primary) hypertension: Secondary | ICD-10-CM | POA: Diagnosis not present

## 2015-06-27 DIAGNOSIS — I482 Chronic atrial fibrillation, unspecified: Secondary | ICD-10-CM

## 2015-06-27 DIAGNOSIS — I5032 Chronic diastolic (congestive) heart failure: Secondary | ICD-10-CM | POA: Diagnosis not present

## 2015-06-27 DIAGNOSIS — I739 Peripheral vascular disease, unspecified: Secondary | ICD-10-CM

## 2015-06-27 DIAGNOSIS — R0602 Shortness of breath: Secondary | ICD-10-CM

## 2015-06-27 NOTE — Progress Notes (Signed)
Patient ID: Charlotte [name], female   DOB: [birth year], 74 y.o.   MRN: [id number]      SUBJECTIVE: Mrs. Railsback presents for follow up of chronic atrial fibrillation, chronic diastolic heart failure, polycythemia vera, HTN, and PVD with a h/o right fem-pop bypass and endarterectomy.   She was hospitalized earlier this year for heart failure. Echocardiogram showed normal LV systolic function, EF 0000000. Diastolic dysfunction could not be evaluated because of atrial fibrillation however she had severe biatrial enlargement suggestive of significant diastolic dysfunction. PASP was 50 and she had a dilated IVC.  She occasionally becomes short of breath when doing household activities and uses between 2.5 and 3 L of oxygen. She denies chest pain, leg swelling, and palpitations.    Review of Systems: As per "subjective", otherwise negative.  No Known Allergies  Current Outpatient Prescriptions  Medication Sig Dispense Refill  . aspirin EC 81 MG tablet Take 81 mg by mouth daily.    . Cholecalciferol (VITAMIN D3) 5000 UNITS CAPS Take 1 capsule by mouth daily.    Marland Kitchen diltiazem (CARDIZEM CD) 300 MG 24 hr capsule Take 1 capsule (300 mg total) by mouth daily. 30 capsule 1  . gabapentin (NEURONTIN) 100 MG capsule Take 100 mg by mouth 3 (three) times daily.      . hydroxyurea (HYDREA) 500 MG capsule Take 500 mg PO BID Monday- Friday.  May take with food to minimize GI side effects. 40 capsule 5  . levothyroxine (SYNTHROID, LEVOTHROID) 75 MCG tablet     . metoprolol (TOPROL-XL) 100 MG 24 hr tablet Take 100 mg by mouth 2 (two) times daily.      . Potassium Chloride ER 20 MEQ TBCR Take 20 mEq by mouth daily. 30 tablet 1  . torsemide (DEMADEX) 20 MG tablet Take 1 tablet (20 mg total) by mouth daily. 60 tablet 1  . warfarin (COUMADIN) 5 MG tablet Take as directed by [facility] 30 tablet 3   No current facility-administered medications for this visit.    Past Medical History  Diagnosis Date  .  Hypertension   . Arthritis   . Varicose veins   . Deaf   . Elevated WBC count     and platelets  . Thyroid disease   . Irregular heart rate   . 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 Peripheral neuropathy (HCC)     feet  . 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 Atrial fibrillation, chronic ([place])   . 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 Peripheral vascular disease ([place])   . Ulcer of ankle ([place]) 05/2014    left and right ankle  . Heart failure ([place])   . Pulmonary edema   . DVT (deep venous thrombosis) [facility])     Past Surgical History  Procedure Laterality Date  . Blt    . Abdominal hysterectomy    . Coronary angioplasty    . Ct of abd    . Hip pinning,cannulated  11/19/2011    Procedure: CANNULATED HIP PINNING;  Surgeon: Sanjuana Kava, MD;  Location: [facility];  Service: Orthopedics;  Laterality: Right;  . Fasciotomy  11/29/2011    Procedure: FASCIOTOMY;  Surgeon: Carole Civil, MD;  Location: [facility];  Service: Orthopedics;  Laterality: Right;  right thigh   . Dressing change under anesthesia  12/02/2011    Procedure: DRESSING CHANGE UNDER ANESTHESIA;  Surgeon: Carole Civil, MD;  Location: [facility];  Service: Orthopedics;  Laterality: Right;  . Endarterectomy femoral Right 09/15/2012  Procedure: ENDARTERECTOMY FEMORAL;  Surgeon: Mal Misty, MD;  Location: [place];  Service: Vascular;  Laterality: Right;  . Femoral-popliteal bypass graft Right 09/15/2012    Procedure: BYPASS GRAFT FEMORAL-POPLITEAL ARTERY;  Surgeon: Mal Misty, MD;  Location: [place];  Service: Vascular;  Laterality: Right;  . Patch angioplasty Right 09/15/2012    Procedure: PATCH ANGIOPLASTY;  Surgeon: Mal Misty, MD;  Location: [place];  Service: Vascular;  Laterality: Right;  . Abdominal aortagram N/A 09/14/2012    Procedure: ABDOMINAL [name];  Surgeon: Serafina Mitchell, MD;  Location: [facility] CATH LAB;  Service: Cardiovascular;  Laterality: N/A;    Social History   Social History  . Marital  Status: Married    Spouse Name: N/A  . Number of Children: N/A  . Years of Education: N/A   Occupational History  . Not on file.   Social History Main Topics  . Smoking status: Never Smoker   . Smokeless tobacco: Never Used  . Alcohol Use: No  . Drug Use: No  . Sexual Activity: No   Other Topics Concern  . Not on file   Social History Narrative     Filed Vitals:   06/27/15 0941  BP: 128/80  Pulse: 84  Height: 6' (1.829 m)  Weight: 175 lb 9.6 oz (79.652 kg)  SpO2: 88%    PHYSICAL EXAM General: NAD, hard of hearing Neck: No JVD, no thyromegaly. Lungs: Diminished but clear to auscultation bilaterally with normal respiratory effort. CV: Nondisplaced PMI. Irregular rhythm, normal S1/S2, no S3, no murmur. No pretibial or periankle edema. No carotid bruit. Bilateral venous varicosities.  Abdomen: Soft, nontender, no distention.  Neurologic: Alert and oriented x 3.  Psych: Normal affect. Skin: Normal. Extremities: No clubbing or cyanosis.  Musculoskeletal: No gross deformities.   ECG: Most recent ECG reviewed.      ASSESSMENT AND PLAN: 1. Chronic atrial fibrillation: Symptomatically stable. Rate is controlled on current doses of metoprolol and diltiazem. Will continue warfarin.   2. Essential HTN: Controlled on current therapy. No changes.  3. PVD: Should follow up with vascular surgery. On ASA.  4. Chronic diastolic heart failure: Euvolemic and stable. No changes to diuretic regimen, torsemide 20 mg daily.  Dispo: f/u 6 months with [facility].  Kate Sable, M.D., F.A.C.C.

## 2015-06-27 NOTE — Patient Instructions (Signed)
Your physician wants you to follow-up in: 6 months with K Lawrence NP You will receive a reminder letter in the mail two months in advance. If you don't receive a letter, please call our office to schedule the follow-up appointment.    Your physician recommends that you continue on your current medications as directed. Please refer to the Current Medication list given to you today.     If you need a refill on your cardiac medications before your next appointment, please call your pharmacy.     Thank you for choosing [facility] !        

## 2015-07-09 ENCOUNTER — Ambulatory Visit (INDEPENDENT_AMBULATORY_CARE_PROVIDER_SITE_OTHER): Payer: Medicare Other | Admitting: *Deleted

## 2015-07-09 DIAGNOSIS — I482 Chronic atrial fibrillation, unspecified: Secondary | ICD-10-CM

## 2015-07-09 DIAGNOSIS — Z5181 Encounter for therapeutic drug level monitoring: Secondary | ICD-10-CM

## 2015-07-09 LAB — POCT INR: INR: 2.4

## 2015-07-12 ENCOUNTER — Encounter (HOSPITAL_COMMUNITY): Payer: Medicare Other | Attending: Hematology and Oncology

## 2015-07-12 DIAGNOSIS — D45 Polycythemia vera: Secondary | ICD-10-CM | POA: Diagnosis not present

## 2015-07-12 LAB — CBC WITH DIFFERENTIAL/PLATELET
BASOS ABS: 0.4 10*3/uL — AB (ref 0.0–0.1)
BASOS PCT: 2 %
EOS ABS: 0.3 10*3/uL (ref 0.0–0.7)
Eosinophils Relative: 1 %
HEMATOCRIT: 45 % (ref 36.0–46.0)
HEMOGLOBIN: 13.6 g/dL (ref 12.0–15.0)
Lymphocytes Relative: 7 %
Lymphs Abs: 1.4 10*3/uL (ref 0.7–4.0)
MCH: 25.3 pg — ABNORMAL LOW (ref 26.0–34.0)
MCHC: 30.2 g/dL (ref 30.0–36.0)
MCV: 83.6 fL (ref 78.0–100.0)
MONOS PCT: 3 %
Monocytes Absolute: 0.6 10*3/uL (ref 0.1–1.0)
NEUTROS ABS: 17.2 10*3/uL — AB (ref 1.7–7.7)
NEUTROS PCT: 87 %
Platelets: 402 10*3/uL — ABNORMAL HIGH (ref 150–400)
RBC: 5.38 MIL/uL — AB (ref 3.87–5.11)
RDW: 20.6 % — ABNORMAL HIGH (ref 11.5–15.5)
WBC: 19.9 10*3/uL — AB (ref 4.0–10.5)

## 2015-07-12 NOTE — Progress Notes (Signed)
LABS DRAWN

## 2015-08-07 ENCOUNTER — Inpatient Hospital Stay (HOSPITAL_COMMUNITY)
Admission: EM | Admit: 2015-08-07 | Discharge: 2015-08-10 | DRG: 193 | Disposition: A | Discharge status: Home / Self Care | Payer: Medicare Other | Attending: Internal Medicine | Admitting: Internal Medicine

## 2015-08-07 ENCOUNTER — Emergency Department (HOSPITAL_COMMUNITY): Payer: Medicare Other

## 2015-08-07 ENCOUNTER — Encounter (HOSPITAL_COMMUNITY): Payer: Self-pay | Admitting: Emergency Medicine

## 2015-08-07 DIAGNOSIS — R0602 Shortness of breath: Secondary | ICD-10-CM | POA: Diagnosis not present

## 2015-08-07 DIAGNOSIS — Z23 Encounter for immunization: Secondary | ICD-10-CM | POA: Diagnosis not present

## 2015-08-07 DIAGNOSIS — J101 Influenza due to other identified influenza virus with other respiratory manifestations: Secondary | ICD-10-CM | POA: Diagnosis present

## 2015-08-07 DIAGNOSIS — Z7982 Long term (current) use of aspirin: Secondary | ICD-10-CM | POA: Diagnosis not present

## 2015-08-07 DIAGNOSIS — I10 Essential (primary) hypertension: Secondary | ICD-10-CM | POA: Diagnosis present

## 2015-08-07 DIAGNOSIS — H9190 Unspecified hearing loss, unspecified ear: Secondary | ICD-10-CM | POA: Diagnosis present

## 2015-08-07 DIAGNOSIS — J9621 Acute and chronic respiratory failure with hypoxia: Secondary | ICD-10-CM | POA: Diagnosis present

## 2015-08-07 DIAGNOSIS — J1000 Influenza due to other identified influenza virus with unspecified type of pneumonia: Secondary | ICD-10-CM | POA: Diagnosis not present

## 2015-08-07 DIAGNOSIS — I5032 Chronic diastolic (congestive) heart failure: Secondary | ICD-10-CM | POA: Diagnosis present

## 2015-08-07 DIAGNOSIS — J189 Pneumonia, unspecified organism: Secondary | ICD-10-CM | POA: Diagnosis present

## 2015-08-07 DIAGNOSIS — G629 Polyneuropathy, unspecified: Secondary | ICD-10-CM | POA: Diagnosis present

## 2015-08-07 DIAGNOSIS — J90 Pleural effusion, not elsewhere classified: Secondary | ICD-10-CM

## 2015-08-07 DIAGNOSIS — I5033 Acute on chronic diastolic (congestive) heart failure: Secondary | ICD-10-CM | POA: Diagnosis present

## 2015-08-07 DIAGNOSIS — Z7901 Long term (current) use of anticoagulants: Secondary | ICD-10-CM

## 2015-08-07 DIAGNOSIS — Z9981 Dependence on supplemental oxygen: Secondary | ICD-10-CM | POA: Diagnosis not present

## 2015-08-07 DIAGNOSIS — D45 Polycythemia vera: Secondary | ICD-10-CM | POA: Diagnosis present

## 2015-08-07 DIAGNOSIS — I482 Chronic atrial fibrillation, unspecified: Secondary | ICD-10-CM | POA: Diagnosis present

## 2015-08-07 DIAGNOSIS — R918 Other nonspecific abnormal finding of lung field: Secondary | ICD-10-CM | POA: Diagnosis not present

## 2015-08-07 DIAGNOSIS — J9601 Acute respiratory failure with hypoxia: Secondary | ICD-10-CM | POA: Diagnosis not present

## 2015-08-07 DIAGNOSIS — R0600 Dyspnea, unspecified: Secondary | ICD-10-CM

## 2015-08-07 DIAGNOSIS — I739 Peripheral vascular disease, unspecified: Secondary | ICD-10-CM | POA: Diagnosis present

## 2015-08-07 LAB — PROTIME-INR
INR: 1.83 — ABNORMAL HIGH (ref 0.00–1.49)
Prothrombin Time: 21.1 seconds — ABNORMAL HIGH (ref 11.6–15.2)

## 2015-08-07 LAB — CBC
HCT: 46.7 % — ABNORMAL HIGH (ref 36.0–46.0)
Hemoglobin: 14.1 g/dL (ref 12.0–15.0)
MCH: 25.2 pg — AB (ref 26.0–34.0)
MCHC: 30.2 g/dL (ref 30.0–36.0)
MCV: 83.5 fL (ref 78.0–100.0)
PLATELETS: 314 10*3/uL (ref 150–400)
RBC: 5.59 MIL/uL — AB (ref 3.87–5.11)
RDW: 21.4 % — ABNORMAL HIGH (ref 11.5–15.5)
WBC: 16.8 10*3/uL — ABNORMAL HIGH (ref 4.0–10.5)

## 2015-08-07 LAB — LACTIC ACID, PLASMA
Lactic Acid, Venous: 1.5 mmol/L (ref 0.5–2.0)
Lactic Acid, Venous: 2.5 mmol/L (ref 0.5–2.0)

## 2015-08-07 LAB — BASIC METABOLIC PANEL
Anion gap: 12 (ref 5–15)
BUN: 15 mg/dL (ref 6–20)
CO2: 27 mmol/L (ref 22–32)
Calcium: 9.3 mg/dL (ref 8.9–10.3)
Chloride: 104 mmol/L (ref 101–111)
Creatinine, Ser: 1 mg/dL (ref 0.44–1.00)
GFR calc Af Amer: >60 mL/min (ref >60)
GFR, EST NON AFRICAN AMERICAN: 55 mL/min — AB (ref >60)
GLUCOSE: 114 mg/dL — AB (ref 65–99)
POTASSIUM: 3.8 mmol/L (ref 3.5–5.1)
Sodium: 143 mmol/L (ref 135–145)

## 2015-08-07 LAB — I-STAT CG4 LACTIC ACID, ED: LACTIC ACID, VENOUS: 3.07 mmol/L — AB (ref 0.5–2.0)

## 2015-08-07 LAB — TROPONIN I: Troponin I: <0.03 ng/mL (ref <0.031)

## 2015-08-07 MED ORDER — CEFTRIAXONE SODIUM 1 G IJ SOLR
1.0000 g | Freq: Once | INTRAMUSCULAR | Status: AC
  Administered 2015-08-07: 1 g via INTRAVENOUS
  Filled 2015-08-07: qty 10

## 2015-08-07 MED ORDER — CETYLPYRIDINIUM CHLORIDE 0.05 % MT LIQD
7.0000 mL | Freq: Two times a day (BID) | OROMUCOSAL | Status: DC
  Administered 2015-08-07 – 2015-08-09 (×5): 7 mL via OROMUCOSAL

## 2015-08-07 MED ORDER — ALBUTEROL SULFATE (2.5 MG/3ML) 0.083% IN NEBU
5.0000 mg | INHALATION_SOLUTION | Freq: Once | RESPIRATORY_TRACT | Status: AC
  Administered 2015-08-07: 5 mg via RESPIRATORY_TRACT
  Filled 2015-08-07: qty 6

## 2015-08-07 MED ORDER — PNEUMOCOCCAL VAC POLYVALENT 25 MCG/0.5ML IJ INJ
0.5000 mL | INJECTION | INTRAMUSCULAR | Status: AC
  Administered 2015-08-08: 0.5 mL via INTRAMUSCULAR
  Filled 2015-08-07: qty 0.5

## 2015-08-07 MED ORDER — SODIUM CHLORIDE 0.9 % IV BOLUS (SEPSIS): 500.0000 mL | INTRAVENOUS | Status: DC

## 2015-08-07 MED ORDER — SODIUM CHLORIDE 0.9 % IV BOLUS (SEPSIS)
1000.0000 mL | INTRAVENOUS | Status: AC
  Administered 2015-08-07: 1000 mL via INTRAVENOUS

## 2015-08-07 MED ORDER — DEXTROSE 5 % IV SOLN
500.0000 mg | Freq: Once | INTRAVENOUS | Status: AC
  Administered 2015-08-07: 500 mg via INTRAVENOUS
  Filled 2015-08-07: qty 500

## 2015-08-07 MED ORDER — ASPIRIN EC 81 MG PO TBEC
81.0000 mg | DELAYED_RELEASE_TABLET | Freq: Every day | ORAL | Status: DC
  Administered 2015-08-08 – 2015-08-10 (×3): 81 mg via ORAL
  Filled 2015-08-07 (×3): qty 1

## 2015-08-07 MED ORDER — VITAMIN D 1000 UNITS PO TABS
5000.0000 [IU] | ORAL_TABLET | Freq: Every day | ORAL | Status: DC
  Administered 2015-08-08 – 2015-08-09 (×2): 5000 [IU] via ORAL
  Filled 2015-08-07 (×2): qty 5

## 2015-08-07 MED ORDER — DEXTROSE 5 % IV SOLN
500.0000 mg | INTRAVENOUS | Status: DC
  Filled 2015-08-07: qty 500

## 2015-08-07 MED ORDER — WARFARIN - PHARMACIST DOSING INPATIENT
Status: DC
  Administered 2015-08-09: 16:00:00

## 2015-08-07 MED ORDER — METOPROLOL TARTRATE 50 MG PO TABS
100.0000 mg | ORAL_TABLET | Freq: Two times a day (BID) | ORAL | Status: DC
  Administered 2015-08-07 – 2015-08-10 (×6): 100 mg via ORAL
  Filled 2015-08-07 (×6): qty 2

## 2015-08-07 MED ORDER — DEXTROSE 5 % IV SOLN
1.0000 g | INTRAVENOUS | Status: DC
  Filled 2015-08-07: qty 10

## 2015-08-07 MED ORDER — GABAPENTIN 100 MG PO CAPS
100.0000 mg | ORAL_CAPSULE | Freq: Three times a day (TID) | ORAL | Status: DC
  Administered 2015-08-07 – 2015-08-10 (×8): 100 mg via ORAL
  Filled 2015-08-07 (×8): qty 1

## 2015-08-07 MED ORDER — LEVOTHYROXINE SODIUM 75 MCG PO TABS
75.0000 ug | ORAL_TABLET | Freq: Every day | ORAL | Status: DC
Start: 2015-08-08 — End: 2015-08-10
  Administered 2015-08-08 – 2015-08-10 (×3): 75 ug via ORAL
  Filled 2015-08-07 (×3): qty 1

## 2015-08-07 MED ORDER — DILTIAZEM HCL ER COATED BEADS 240 MG PO CP24
240.0000 mg | ORAL_CAPSULE | Freq: Every day | ORAL | Status: DC
  Administered 2015-08-08 – 2015-08-10 (×3): 240 mg via ORAL
  Filled 2015-08-07 (×3): qty 1

## 2015-08-07 MED ORDER — WARFARIN SODIUM 5 MG PO TABS
5.0000 mg | ORAL_TABLET | Freq: Once | ORAL | Status: AC
  Administered 2015-08-07: 5 mg via ORAL
  Filled 2015-08-07: qty 1

## 2015-08-07 MED ORDER — METHYLPREDNISOLONE SODIUM SUCC 125 MG IJ SOLR
125.0000 mg | Freq: Once | INTRAMUSCULAR | Status: AC
  Administered 2015-08-07: 125 mg via INTRAVENOUS
  Filled 2015-08-07: qty 2

## 2015-08-07 NOTE — ED Notes (Signed)
C/o chills, nasal and chest congestion. And c/o SOB, uses O2 @ 3l/m.

## 2015-08-07 NOTE — H&P (Signed)
PCP:   [name], MD   Chief Complaint:  Cough, chills, sob for over a day  HPI: 75 yo female h/o htn, afib on coumadin, chf, on 3 liters oxygen at home prn comes in with sob, chills , cough , weakness for a day.  She has had a lot of nasal congestion, coughing a lot last night.  Today got worse sob, and started having chills.  Came to the ED mainly for her dyspnea.  No swelling in legs.  She did not check her temperature.  No sick contacts.  No recent abx.  Found to have pna.  Pt oxygen sats on arrival were 81% on RA.  She feels better now that she has oxygen on and given some ivf.  referrred for admission for her hypoxia and pna.  Review of Systems:  Positive and negative as per HPI otherwise all other systems are negative  Past Medical History: Past Medical History  Diagnosis Date  . Hypertension   . Arthritis   . Varicose veins   . Deaf   . Elevated WBC count     and platelets  . Thyroid disease   . Irregular heart rate   . 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 Peripheral neuropathy (HCC)     feet  . 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 Atrial fibrillation, chronic ([place])   . 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 Peripheral vascular disease ([place])   . Ulcer of ankle ([place]) 05/2014    left and right ankle  . Heart failure ([place])   . Pulmonary edema   . DVT (deep venous thrombosis) [facility])    Past Surgical History  Procedure Laterality Date  . Blt    . Abdominal hysterectomy    . Coronary angioplasty    . Ct of abd    . Hip pinning,cannulated  11/19/2011    Procedure: CANNULATED HIP PINNING;  Surgeon: Sanjuana Kava, MD;  [facility];  Service: Orthopedics;  Laterality: Right;  . Fasciotomy  11/29/2011    Procedure: FASCIOTOMY;  Surgeon: Carole Civil, MD;  [facility];  Service: Orthopedics;  Laterality: Right;  right thigh   . Dressing change under anesthesia  12/02/2011    Procedure: DRESSING CHANGE UNDER ANESTHESIA;  Surgeon: Carole Civil, MD;  Location[facility];  Service: Orthopedics;  Laterality: Right;  . Endarterectomy femoral Right 09/15/2012    Procedure: ENDARTERECTOMY FEMORAL;  Surgeon: Mal Misty, MD;  Location: [place];  Service: Vascular;  Laterality: Right;  . Femoral-popliteal bypass graft Right 09/15/2012    Procedure: BYPASS GRAFT FEMORAL-POPLITEAL ARTERY;  Surgeon: Mal Misty, MD;  Location: [place];  Service: Vascular;  Laterality: Right;  . Patch angioplasty Right 09/15/2012    Procedure: PATCH ANGIOPLASTY;  Surgeon: Mal Misty, MD;  Location: [place];  Service: Vascular;  Laterality: Right;  . Abdominal aortagram N/A 09/14/2012    Procedure: ABDOMINAL [name];  Surgeon: Serafina Mitchell, MD;  Location: [facility] CATH LAB;  Service: Cardiovascular;  Laterality: N/A;    Medications: Prior to Admission medications   Medication Sig Start Date End Date Taking? Authorizing Provider  aspirin EC 81 MG tablet Take 81 mg by mouth daily.   Yes Historical Provider, MD  CARTIA XT 240 MG 24 hr capsule Take 240 mg by mouth daily.  06/18/15  Yes Historical Provider, MD  Cholecalciferol (VITAMIN D3) 5000 UNITS CAPS Take 1 capsule by mouth daily.   Yes Historical Provider, MD  gabapentin (  NEURONTIN) 100 MG capsule Take 100 mg by mouth 3 (three) times daily.     Yes Historical Provider, MD  hydroxyurea (HYDREA) 500 MG capsule Take 500 mg PO BID Monday- Friday.  May take with food to minimize GI side effects. Patient taking differently: Take 500 mg by mouth 2 (two) times daily. Monday- Friday ONLY.  May take with food to minimize GI side effects. 06/14/15  Yes Baird Cancer, PA-C  levothyroxine (SYNTHROID, LEVOTHROID) 75 MCG tablet Take 75 mcg by mouth daily before breakfast.  10/16/14  Yes Historical Provider, MD  metoprolol (LOPRESSOR) 100 MG tablet Take 100 mg by mouth 2 (two) times daily. 06/05/15  Yes Historical Provider, MD  Potassium Chloride ER 20 MEQ TBCR Take 20 mEq by mouth daily. 11/29/14  Yes Lezlie Octave Black, NP   torsemide (DEMADEX) 20 MG tablet Take 1 tablet (20 mg total) by mouth daily. 11/28/14  Yes Radene Gunning, NP  warfarin (COUMADIN) 5 MG tablet Take as directed by [facility] Patient taking differently: Take 5 mg by mouth. 5mg  on MWF, and take 2.5mg  on all other days. Take as directed by [facility] 05/22/15  Yes Herminio Commons, MD    Allergies:  No Known Allergies  Social History:  reports that she has never smoked. She has never used smokeless tobacco. She reports that she does not drink alcohol or use illicit drugs.  Family History: Family History  Problem Relation Age of Onset  . Diabetes Son   . Heart failure Mother   . Stroke Father   . Hypertension Sister   . Hypertension Sister   . Stroke Sister     Physical Exam: Filed Vitals:   08/07/15 1830 08/07/15 1832 08/07/15 1900 08/07/15 1930  BP: 131/83  127/88 134/82  Pulse: 100  87 99  Temp:      TempSrc:      Resp: 19  20 16   Height:      Weight:      SpO2: 91% 95% 88% 89%   General appearance: alert, cooperative and no distress Head: Normocephalic, without obvious abnormality, atraumatic Eyes: negative Nose: Nares normal. Septum midline. Mucosa normal. No drainage or sinus tenderness. Neck: no JVD and supple, symmetrical, trachea midline Lungs: clear to auscultation bilaterally Heart: regular rate and rhythm, S1, S2 normal, no murmur, click, rub or gallop Abdomen: soft, non-tender; bowel sounds normal; no masses,  no organomegaly Extremities: extremities normal, atraumatic, no cyanosis or edema Pulses: 2+ and symmetric Skin: Skin color, texture, turgor normal. No rashes or lesions Neurologic: Grossly normal    Labs on Admission:   Recent Labs  08/07/15 1733  NA 143  K 3.8  CL 104  CO2 27  GLUCOSE 114*  BUN 15  CREATININE 1.00  CALCIUM 9.3    Recent Labs  08/07/15 1733  WBC 16.8*  HGB 14.1  HCT 46.7*  MCV 83.5  PLT 314    Recent Labs  08/07/15 1733  TROPONINI <0.03    Radiological Exams on Admission: Dg Chest 1 View  08/07/2015  CLINICAL DATA:  75 year old female with a history of shortness of breath. EXAM: CHEST 1 VIEW COMPARISON:  CT 10/14/2013, chest x-ray 11/22/2014, 11/23/2014, 11/27/2014 FINDINGS: Cardiomediastinal silhouette again demonstrates cardiomegaly. More pronounced than the comparison. Bilateral dense basilar opacities, greater on the left. Obscuration the left hemidiaphragm and left heart border. Blunting of the right costophrenic angle. Airspace opacity at the right base. Interlobular septal thickening with coarsened interstitial markings and prominence of the  central vasculature. IMPRESSION: Bilateral basilar opacities, worst on the left, may reflect a combination of edema, left greater than right pleural effusion, and/ or consolidation. Signed, Dulcy Fanny. Earleen Newport, DO [facility] Electronically Signed   By: Corrie Mckusick D.O.   On: 08/07/2015 18:10   Old chart reviewed Case discussed with dr ray  Assessment/Plan  75 yo female with acute hypoxic respiratory failure secondary to CAP  Principal Problem:   CAP (community acquired pneumonia)- pna pathway, rocephin and azithromycin.  Blood and sputum cx ordered.  oxgyen supplentation to keep sats above 92%.  Appears nontoxic.  Check quick flu.  Lactate 3, given appropriate ivf bolus in ED, will serial lactic acid levels q 3 hours until resolves.  Active Problems:   Polycythemia vera ([place])- noted   Chronic atrial fibrillation ([place])- rate controlled, coumadin per pharm consult   Peripheral vascular disease ([place])- noted   Dyspnea- due to pna   Hard of hearing- noted   Acute respiratory failure with hypoxia ([place])- due to pna, improved on 2 liters [place]   Chronic diastolic congestive heart failure ([place])- stable and compensated at this time.   Hold her diuretics for now.    Admit to medical bed.  Full code.  Jamilynn Whitacre A 08/07/2015, 8:01  PM

## 2015-08-07 NOTE — Progress Notes (Signed)
ANTICOAGULATION CONSULT NOTE - Initial Consult  Pharmacy Consult for coumadin Indication: atrial fibrillation  No Known Allergies  Patient Measurements: Height: 6' (182.9 cm) Weight: 174 lb (78.926 kg) IBW/kg (Calculated) : 73.1   Vital Signs: Temp: 98.4 F (36.9 C) (01/03 2102) Temp Source: Oral (01/03 2102) BP: 129/73 mmHg (01/03 2102) Pulse Rate: 89 (01/03 2102)  Labs:  Recent Labs  08/07/15 1733  HGB 14.1  HCT 46.7*  PLT 314  LABPROT 21.1*  INR 1.83*  CREATININE 1.00  TROPONINI <0.03    Estimated Creatinine Clearance: 58.7 mL/min (by C-G formula based on Cr of 1).   Medical History: Past Medical History  Diagnosis Date  . Hypertension   . Arthritis   . Varicose veins   . Deaf   . Elevated WBC count     and platelets  . Thyroid disease   . Irregular heart rate   . 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 Peripheral neuropathy (HCC)     feet  . 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 Atrial fibrillation, chronic ([place])   . 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 Peripheral vascular disease ([place])   . Ulcer of ankle ([place]) 05/2014    left and right ankle  . Heart failure ([place])   . Pulmonary edema   . DVT (deep venous thrombosis) (HCC)     Medications:  Prescriptions prior to admission  Medication Sig Dispense Refill Last Dose  . aspirin EC 81 MG tablet Take 81 mg by mouth daily.   08/07/2015 at Unknown time  . CARTIA XT 240 MG 24 hr capsule Take 240 mg by mouth daily.    08/07/2015 at Unknown time  . Cholecalciferol (VITAMIN D3) 5000 UNITS CAPS Take 1 capsule by mouth daily.   08/07/2015 at Unknown time  . gabapentin (NEURONTIN) 100 MG capsule Take 100 mg by mouth 3 (three) times daily.     08/07/2015 at Unknown time  . hydroxyurea (HYDREA) 500 MG capsule Take 500 mg PO BID Monday- Friday.  May take with food to minimize GI side effects. (Patient taking differently: Take 500 mg by mouth 2 (two) times daily. Monday- Friday ONLY.  May take with food to  minimize GI side effects.) 40 capsule 5 08/07/2015 at Unknown time  . levothyroxine (SYNTHROID, LEVOTHROID) 75 MCG tablet Take 75 mcg by mouth daily before breakfast.    08/07/2015 at Unknown time  . metoprolol (LOPRESSOR) 100 MG tablet Take 100 mg by mouth 2 (two) times daily.   08/07/2015 at 1700  . Potassium Chloride ER 20 MEQ TBCR Take 20 mEq by mouth daily. 30 tablet 1 08/07/2015 at Unknown time  . torsemide (DEMADEX) 20 MG tablet Take 1 tablet (20 mg total) by mouth daily. 60 tablet 1 08/07/2015 at Unknown time  . warfarin (COUMADIN) 5 MG tablet Take as directed by [facility] (Patient taking differently: Take 5 mg by mouth. 5mg  on MWF, and take 2.5mg  on all other days. Take as directed by [facility]madin Clinic) 30 tablet 3 08/06/2015 at 1800    Assessment: 75 yo lady to continue coumadin for afib.  Admission INR subtherapeutic. Goal of Therapy:  INR 2-3 Monitor platelets by anticoagulation protocol: Yes   Plan:  Coumadin 5 mg po tonight. Daily PT/INR Monitor for bleeding complications  Thanks for allowing pharmacy to be a part of this patient's care.  Excell Seltzer, PharmD Clinical Pharmacist  08/07/2015,9:31 PM

## 2015-08-07 NOTE — ED Provider Notes (Signed)
CSN: [id number]     Arrival date & time 08/07/15  1711 History   First MD Initiated Contact with Patient 08/07/15 1726     Chief Complaint  Patient presents with  . Nasal Congestion  . Chills  . Shortness of Breath     (Consider location/radiation/quality/duration/timing/severity/associated sxs/prior Treatment) HPI  75 year old female history of polycythemia vera, chronic atrial fibrillation, and peripheral vascular disease who presents today complaining of cough, congestion, and dyspnea. She reports beginning to have some URI symptoms that began yesterday. Cough has been intermittently productive but she is unable to state whether or not the sputum has been discolored. She does not know if she has had a fever but has had some chills and body aches. Today she became more dyspneic and came in secondary to this. She has had her flu shot this year. No known sick contacts.  Past Medical History  Diagnosis Date  . Hypertension   . Arthritis   . Varicose veins   . Deaf   . Elevated WBC count     and platelets  . Thyroid disease   . Irregular heart rate   . 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 Peripheral neuropathy (HCC)     feet  . 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 Atrial fibrillation, chronic ([place])   . 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 Peripheral vascular disease ([place])   . Ulcer of ankle ([place]) 05/2014    left and right ankle  . Heart failure ([place])   . Pulmonary edema   . DVT (deep venous thrombosis) [facility])    Past Surgical History  Procedure Laterality Date  . Blt    . Abdominal hysterectomy    . Coronary angioplasty    . Ct of abd    . Hip pinning,cannulated  11/19/2011    Procedure: CANNULATED HIP PINNING;  Surgeon: Sanjuana Kava, MD;  Location: [facility];  Service: Orthopedics;  Laterality: Right;  . Fasciotomy  11/29/2011    Procedure: FASCIOTOMY;  Surgeon: Carole Civil, MD;  Location: [facility];  Service: Orthopedics;  Laterality: Right;  right thigh   .  Dressing change under anesthesia  12/02/2011    Procedure: DRESSING CHANGE UNDER ANESTHESIA;  Surgeon: Carole Civil, MD;  Location: [facility];  Service: Orthopedics;  Laterality: Right;  . Endarterectomy femoral Right 09/15/2012    Procedure: ENDARTERECTOMY FEMORAL;  Surgeon: Mal Misty, MD;  Location: [place];  Service: Vascular;  Laterality: Right;  . Femoral-popliteal bypass graft Right 09/15/2012    Procedure: BYPASS GRAFT FEMORAL-POPLITEAL ARTERY;  Surgeon: Mal Misty, MD;  Location: [place];  Service: Vascular;  Laterality: Right;  . Patch angioplasty Right 09/15/2012    Procedure: PATCH ANGIOPLASTY;  Surgeon: Mal Misty, MD;  Location: [place];  Service: Vascular;  Laterality: Right;  . Abdominal aortagram N/A 09/14/2012    Procedure: ABDOMINAL [name];  Surgeon: Serafina Mitchell, MD;  Location: [facility] CATH LAB;  Service: Cardiovascular;  Laterality: N/A;   Family History  Problem Relation Age of Onset  . Diabetes Son   . Heart failure Mother   . Stroke Father   . Hypertension Sister   . Hypertension Sister   . Stroke Sister    Social History  Substance Use Topics  . Smoking status: Never Smoker   . Smokeless tobacco: Never Used  . Alcohol Use: No   OB History    No data available     Review of Systems  All other systems reviewed  and are negative.     Allergies  Review of patient's allergies indicates no known allergies.  Home Medications   Prior to Admission medications   Medication Sig Start Date End Date Taking? Authorizing Provider  aspirin EC 81 MG tablet Take 81 mg by mouth daily.   Yes Historical Provider, MD  CARTIA XT 240 MG 24 hr capsule Take 240 mg by mouth daily.  06/18/15  Yes Historical Provider, MD  Cholecalciferol (VITAMIN D3) 5000 UNITS CAPS Take 1 capsule by mouth daily.   Yes Historical Provider, MD  gabapentin (NEURONTIN) 100 MG capsule Take 100 mg by mouth 3 (three) times daily.     Yes Historical Provider, MD  hydroxyurea (HYDREA) 500 MG  capsule Take 500 mg PO BID Monday- Friday.  May take with food to minimize GI side effects. Patient taking differently: Take 500 mg by mouth 2 (two) times daily. Monday- Friday ONLY.  May take with food to minimize GI side effects. 06/14/15  Yes Baird Cancer, PA-C  levothyroxine (SYNTHROID, LEVOTHROID) 75 MCG tablet Take 75 mcg by mouth daily before breakfast.  10/16/14  Yes Historical Provider, MD  metoprolol (LOPRESSOR) 100 MG tablet Take 100 mg by mouth 2 (two) times daily. 06/05/15  Yes Historical Provider, MD  Potassium Chloride ER 20 MEQ TBCR Take 20 mEq by mouth daily. 11/29/14  Yes Lezlie Octave Black, NP  torsemide (DEMADEX) 20 MG tablet Take 1 tablet (20 mg total) by mouth daily. 11/28/14  Yes Radene Gunning, NP  warfarin (COUMADIN) 5 MG tablet Take as directed by [facility] Patient taking differently: Take 5 mg by mouth. 5mg  on MWF, and take 2.5mg  on all other days. Take as directed by [facility] 05/22/15  Yes Herminio Commons, MD   BP 131/83 mmHg  Pulse 100  Temp(Src) 100.7 F (38.2 C) (Rectal)  Resp 19  Ht 6' (1.829 m)  Wt 79.833 kg  BMI 23.86 kg/m2  SpO2 95% Physical Exam  Constitutional: She is oriented to person, place, and time. She appears well-developed and well-nourished. No distress.  Patient is hard of hearing  HENT:  Head: Normocephalic and atraumatic.  Right Ear: External ear normal.  Left Ear: External ear normal.  Mouth/Throat: Oropharynx is clear and moist.  Eyes: Conjunctivae are normal. Pupils are equal, round, and reactive to light.  Neck: Normal range of motion.  Cardiovascular: An irregularly irregular rhythm present.  Pulmonary/Chest: Effort normal.  Decreased breath sounds bilateral bases no Rales noted  Abdominal: Soft. Bowel sounds are normal. She exhibits no distension. There is no tenderness. There is no rebound and no guarding.  Musculoskeletal: Normal range of motion.  Neurological: She is alert and oriented to person, place, and time.   Skin: Skin is warm and dry.  Psychiatric: She has a normal mood and affect.  Nursing note and vitals reviewed.   ED Course  Procedures (including critical care time) Labs Review Labs Reviewed  CBC - Abnormal; Notable for the following:    WBC 16.8 (*)    RBC 5.59 (*)    HCT 46.7 (*)    MCH 25.2 (*)    RDW 21.4 (*)    All other components within normal limits  BASIC METABOLIC PANEL - Abnormal; Notable for the following:    Glucose, Bld 114 (*)    GFR calc non Af Amer 55 (*)    All other components within normal limits  PROTIME-INR - Abnormal; Notable for the following:    Prothrombin Time 21.1 (*)  INR 1.83 (*)    All other components within normal limits  I-STAT CG4 LACTIC ACID, ED - Abnormal; Notable for the following:    Lactic Acid, Venous 3.07 (*)    All other components within normal limits  CULTURE, BLOOD (ROUTINE X 2)  CULTURE, BLOOD (ROUTINE X 2)  URINE CULTURE  TROPONIN I    Imaging Review Dg Chest 1 View  08/07/2015  CLINICAL DATA:  75 year old female with a history of shortness of breath. EXAM: CHEST 1 VIEW COMPARISON:  CT 10/14/2013, chest x-Esterlene Atiyeh 11/22/2014, 11/23/2014, 11/27/2014 FINDINGS: Cardiomediastinal silhouette again demonstrates cardiomegaly. More pronounced than the comparison. Bilateral dense basilar opacities, greater on the left. Obscuration the left hemidiaphragm and left heart border. Blunting of the right costophrenic angle. Airspace opacity at the right base. Interlobular septal thickening with coarsened interstitial markings and prominence of the central vasculature. IMPRESSION: Bilateral basilar opacities, worst on the left, may reflect a combination of edema, left greater than right pleural effusion, and/ or consolidation. Signed, Dulcy Fanny. Earleen Newport, DO [facility] Radiology Electronically Signed   By: Corrie Mckusick D.O.   On: 08/07/2015 18:10   I have personally reviewed and evaluated these images  and lab results as part of my medical decision-making.   EKG Interpretation None      MDM   Final diagnoses:  Pleural effusion      1- dyspnea- patient with cough, fever, and uri symptoms.  Sats 81% on arrival and now 95% on oxygen at 4 l/m.  CXR with bibasilar opacities, suspect pneumonia alhtough cannot rule out edema/effusion.  Patient does not otherwise appear volume overloaded.She is treated here with zithromax and rocephin for cap.  WBC elevated but has been somewhat chronically elevated.  Temp 100.7.  Patient with initial elevated lactic acid at 3 but normotensive.   2- atrial fib- rate 85-100.   3- subtherapeutic inr- 1.83   Plan admission for futher evaluation and treatment.   Pattricia Boss, MD 08/09/15 [phone]

## 2015-08-08 DIAGNOSIS — J1000 Influenza due to other identified influenza virus with unspecified type of pneumonia: Secondary | ICD-10-CM | POA: Diagnosis not present

## 2015-08-08 DIAGNOSIS — J9601 Acute respiratory failure with hypoxia: Secondary | ICD-10-CM

## 2015-08-08 DIAGNOSIS — I482 Chronic atrial fibrillation: Secondary | ICD-10-CM

## 2015-08-08 DIAGNOSIS — I5032 Chronic diastolic (congestive) heart failure: Secondary | ICD-10-CM

## 2015-08-08 DIAGNOSIS — J189 Pneumonia, unspecified organism: Secondary | ICD-10-CM

## 2015-08-08 DIAGNOSIS — D45 Polycythemia vera: Secondary | ICD-10-CM

## 2015-08-08 DIAGNOSIS — I739 Peripheral vascular disease, unspecified: Secondary | ICD-10-CM

## 2015-08-08 DIAGNOSIS — H9193 Unspecified hearing loss, bilateral: Secondary | ICD-10-CM

## 2015-08-08 DIAGNOSIS — R0600 Dyspnea, unspecified: Secondary | ICD-10-CM

## 2015-08-08 LAB — CBC WITH DIFFERENTIAL/PLATELET
Basophils Absolute: 0 10*3/uL (ref 0.0–0.1)
Basophils Relative: 0 %
Eosinophils Absolute: 0 10*3/uL (ref 0.0–0.7)
Eosinophils Relative: 0 %
HEMATOCRIT: 42.8 % (ref 36.0–46.0)
Hemoglobin: 12.9 g/dL (ref 12.0–15.0)
LYMPHS PCT: 3 %
Lymphs Abs: 0.5 10*3/uL — ABNORMAL LOW (ref 0.7–4.0)
MCH: 25.3 pg — ABNORMAL LOW (ref 26.0–34.0)
MCHC: 30.1 g/dL (ref 30.0–36.0)
MCV: 83.9 fL (ref 78.0–100.0)
MONO ABS: 0.1 10*3/uL (ref 0.1–1.0)
MONOS PCT: 1 %
NEUTROS ABS: 16 10*3/uL — AB (ref 1.7–7.7)
Neutrophils Relative %: 96 %
Platelets: 288 10*3/uL (ref 150–400)
RBC: 5.1 MIL/uL (ref 3.87–5.11)
RDW: 21.2 % — AB (ref 11.5–15.5)
WBC: 16.6 10*3/uL — ABNORMAL HIGH (ref 4.0–10.5)

## 2015-08-08 LAB — BASIC METABOLIC PANEL
ANION GAP: 11 (ref 5–15)
BUN: 15 mg/dL (ref 6–20)
CALCIUM: 8.9 mg/dL (ref 8.9–10.3)
CO2: 27 mmol/L (ref 22–32)
Chloride: 105 mmol/L (ref 101–111)
Creatinine, Ser: 0.91 mg/dL (ref 0.44–1.00)
GFR calc Af Amer: >60 mL/min (ref >60)
GFR calc non Af Amer: >60 mL/min (ref >60)
GLUCOSE: 140 mg/dL — AB (ref 65–99)
POTASSIUM: 3.7 mmol/L (ref 3.5–5.1)
Sodium: 143 mmol/L (ref 135–145)

## 2015-08-08 LAB — INFLUENZA PANEL BY PCR (TYPE A & B)
H1N1FLUPCR: NOT DETECTED
INFLBPCR: NEGATIVE
Influenza A By PCR: POSITIVE — AB

## 2015-08-08 LAB — STREP PNEUMONIAE URINARY ANTIGEN: Strep Pneumo Urinary Antigen: NEGATIVE

## 2015-08-08 LAB — PROTIME-INR
INR: 1.78 — ABNORMAL HIGH (ref 0.00–1.49)
Prothrombin Time: 20.7 seconds — ABNORMAL HIGH (ref 11.6–15.2)

## 2015-08-08 MED ORDER — GUAIFENESIN ER 600 MG PO TB12
1200.0000 mg | ORAL_TABLET | Freq: Two times a day (BID) | ORAL | Status: DC
  Administered 2015-08-08 – 2015-08-10 (×5): 1200 mg via ORAL
  Filled 2015-08-08 (×5): qty 2

## 2015-08-08 MED ORDER — OSELTAMIVIR PHOSPHATE 75 MG PO CAPS
75.0000 mg | ORAL_CAPSULE | Freq: Two times a day (BID) | ORAL | Status: DC
  Administered 2015-08-08 – 2015-08-10 (×6): 75 mg via ORAL
  Filled 2015-08-08 (×6): qty 1

## 2015-08-08 MED ORDER — POTASSIUM CHLORIDE CRYS ER 20 MEQ PO TBCR
40.0000 meq | EXTENDED_RELEASE_TABLET | Freq: Once | ORAL | Status: AC
  Administered 2015-08-08: 40 meq via ORAL
  Filled 2015-08-08: qty 2

## 2015-08-08 MED ORDER — TORSEMIDE 20 MG PO TABS
20.0000 mg | ORAL_TABLET | Freq: Every day | ORAL | Status: DC
  Administered 2015-08-09 – 2015-08-10 (×2): 20 mg via ORAL
  Filled 2015-08-08 (×2): qty 1

## 2015-08-08 MED ORDER — GUAIFENESIN-DM 100-10 MG/5ML PO SYRP: 5.0000 mL | ORAL_SOLUTION | ORAL | Status: DC | PRN

## 2015-08-08 MED ORDER — WARFARIN SODIUM 5 MG PO TABS
2.5000 mg | ORAL_TABLET | Freq: Once | ORAL | Status: DC
Start: 2015-08-08 — End: 2015-08-08

## 2015-08-08 MED ORDER — FUROSEMIDE 10 MG/ML IJ SOLN
40.0000 mg | Freq: Once | INTRAMUSCULAR | Status: AC
  Administered 2015-08-08: 40 mg via INTRAVENOUS
  Filled 2015-08-08: qty 4

## 2015-08-08 MED ORDER — WARFARIN SODIUM 5 MG PO TABS
5.0000 mg | ORAL_TABLET | Freq: Once | ORAL | Status: AC
  Administered 2015-08-08: 5 mg via ORAL
  Filled 2015-08-08: qty 1

## 2015-08-08 NOTE — Care Management Note (Signed)
Case Management Note  Patient Details  Name: ONDREA VANWAGNER MRN: [id number] Date of Birth: 08-24-1940  Subjective/Objective:                  Pt is from home, lives with her husband and son. Pt is ind at baseline but has cane, walker, wheelchair and BSC to use if she needs them. Pt has home O2 with port tanks prior to admission. Pt uses home O2 as needed. Pt has used [facility] for [facility] services in the past but is not currently active.   Action/Plan: Pt plans to return home with self care. No CM needs.   Expected Discharge Date:  08/09/15               Expected Discharge Plan:  Home/Self Care  In-House Referral:  NA  Discharge planning Services  CM Consult  Post Acute Care Choice:  NA Choice offered to:  NA  DME Arranged:    DME Agency:     HH Arranged:    HH Agency:     Status of Service:  Completed, signed off  Medicare Important Message Given:    Date Medicare IM Given:    Medicare IM give by:    Date Additional Medicare IM Given:    Additional Medicare Important Message give by:     If discussed at [place] of Stay Meetings, dates discussed:    Additional Comments:  Sherald Barge, RN 08/08/2015, 11:26 AM

## 2015-08-08 NOTE — Progress Notes (Signed)
[facility] PROGRESS NOTE   Charlotte [name] [id number] DOB: 1941-07-18 DOA: 08/07/2015 PCP: [name], MD  HPI/Subjective: Patient is very hard of hearing, appears on the left side better. Denies any new complaints, feels much better than yesterday, no fever or chills overnight.  Assessment/Plan: Principal Problem:   CAP (community acquired pneumonia) Active Problems:   Polycythemia vera ([place])   Chronic atrial fibrillation (HCC)   Peripheral vascular disease (HCC)   Dyspnea   Hard of hearing   Acute respiratory failure with hypoxia (HCC)   Chronic diastolic congestive heart failure (HCC)   CAP, likely related to influenza type A Pna pathway, rocephin and azithromycin.  Blood and sputum cx ordered. oxgyen supplentation to keep sats above 92%.  Treated with IV fluids and antibiotics in the emergency department, positive for influenza type A. As patient appears nontoxic, discontinue antibiotics and continue Tamiflu for total 5 days.  Acute on chronic respiratory failure with hypoxia Patient is oxygen, 3 L at home as needed, presented with O2 saturation of 81% on room air. Currently on 4 L of oxygen saturation is in the mid 90s.  Polycythemia vera Patient follows with oncology as outpatient, is on Hydrea and aspirin, continue.  Chronic atrial fibrillation Rate controlled, coumadin per pharm consult This patients CHA2DS2-VASc Score and unadjusted Ischemic Stroke Rate (% per year) is equal to 3.2 % stroke rate/year from a score of 3 for CHF, HTN and PVD Above score calculated as 1 point each if present [CHF, HTN, DM, Vascular=MI/PAD/Aortic Plaque, Age if 65-74, or Female] Above score calculated as 2 points each if present [Age > 75, or Stroke/TIA/TE]  Peripheral vascular disease No acute issues for now.  Dyspnea- due to pna  Hard of hearing, chronic conditions.  Chronic diastolic congestive heart failure Oh decompensation, given IV fluids and  diuretics held, restart diuretics.  Code Status: Full Code Family Communication: Plan discussed with the patient. Disposition Plan: Remains inpatient Diet: Diet Heart Room service appropriate?: Yes; Fluid consistency:: Thin  Consultants:  None  Procedures:  None  Antibiotics:  None   Objective: Filed Vitals:   08/07/15 2350 08/08/15 0723  BP: 118/68 143/77  Pulse: 90 96  Temp: 98.9 F (37.2 C) 97.6 F (36.4 C)  Resp: 20 20    Intake/Output Summary (Last 24 hours) at 08/08/15 1214 Last data filed at 08/08/15 0415  Gross per 24 hour  Intake   1250 ml  Output    100 ml  Net   1150 ml   Filed Weights   08/07/15 1716 08/07/15 2102  Weight: 79.833 kg (176 lb) 78.926 kg (174 lb)    Exam: General: Alert and awake, oriented x3, not in any acute distress. HEENT: anicteric sclera, pupils reactive to light and accommodation, EOMI CVS: S1-S2 clear, no murmur rubs or gallops Chest: clear to auscultation bilaterally, no wheezing, rales or rhonchi Abdomen: soft nontender, nondistended, normal bowel sounds, no organomegaly Extremities: no cyanosis, clubbing or edema noted bilaterally Neuro: Cranial nerves II-XII intact, no focal neurological deficits  Data Reviewed: Basic Metabolic Panel:  Recent Labs Lab 08/07/15 1733 08/08/15 0619  NA 143 143  K 3.8 3.7  CL 104 105  CO2 27 27  GLUCOSE 114* 140*  BUN 15 15  CREATININE 1.00 0.91  CALCIUM 9.3 8.9   Liver Function Tests: No results for input(s): AST, ALT, ALKPHOS, BILITOT, PROT, ALBUMIN in the last 168 hours. No results for input(s): LIPASE, AMYLASE in the last 168 hours. No results for input(s):  AMMONIA in the last 168 hours. CBC:  Recent Labs Lab 08/07/15 1733 08/08/15 0619  WBC 16.8* 16.6*  NEUTROABS  --  16.0*  HGB 14.1 12.9  HCT 46.7* 42.8  MCV 83.5 83.9  PLT 314 288   Cardiac Enzymes:  Recent Labs Lab 08/07/15 1733  TROPONINI <0.03   BNP (last 3 results)  Recent Labs  11/22/14 0631    BNP 456.0*    ProBNP (last 3 results) No results for input(s): PROBNP in the last 8760 hours.  CBG: No results for input(s): GLUCAP in the last 168 hours.  Micro Recent Results (from the past 240 hour(s))  Blood culture (routine x 2)     Status: None (Preliminary result)   Collection Time: 08/07/15  6:48 PM  Result Value Ref Range Status   Specimen Description BLOOD LEFT ARM  Final   Special Requests BOTTLES DRAWN AEROBIC AND ANAEROBIC 6CC EACH  Final   Culture NO GROWTH < 24 HOURS  Final   Report Status PENDING  Incomplete  Blood culture (routine x 2)     Status: None (Preliminary result)   Collection Time: 08/07/15  6:51 PM  Result Value Ref Range Status   Specimen Description BLOOD LEFT ARM  Final   Special Requests   Final    BOTTLES DRAWN AEROBIC AND ANAEROBIC AEB=8CC ANA=6CC   Culture NO GROWTH < 24 HOURS  Final   Report Status PENDING  Incomplete     Studies: Dg Chest 1 View  08/07/2015  CLINICAL DATA:  75 year old female with a history of shortness of breath. EXAM: CHEST 1 VIEW COMPARISON:  CT 10/14/2013, chest x-ray 11/22/2014, 11/23/2014, 11/27/2014 FINDINGS: Cardiomediastinal silhouette again demonstrates cardiomegaly. More pronounced than the comparison. Bilateral dense basilar opacities, greater on the left. Obscuration the left hemidiaphragm and left heart border. Blunting of the right costophrenic angle. Airspace opacity at the right base. Interlobular septal thickening with coarsened interstitial markings and prominence of the central vasculature. IMPRESSION: Bilateral basilar opacities, worst on the left, may reflect a combination of edema, left greater than right pleural effusion, and/ or consolidation. Signed, Dulcy Fanny. Earleen Newport, DO [facility] Radiology Electronically Signed   By: Corrie Mckusick D.O.   On: 08/07/2015 18:10    Scheduled Meds: . antiseptic oral rinse  7 mL Mouth Rinse BID  . aspirin EC  81 mg Oral  Daily  . azithromycin  500 mg Intravenous Q24H  . cefTRIAXone (ROCEPHIN)  IV  1 g Intravenous Q24H  . cholecalciferol  5,000 Units Oral Daily  . diltiazem  240 mg Oral Daily  . gabapentin  100 mg Oral TID  . levothyroxine  75 mcg Oral QAC breakfast  . metoprolol  100 mg Oral BID  . oseltamivir  75 mg Oral BID  . warfarin  5 mg Oral Once  . Warfarin - Pharmacist Dosing Inpatient   Does not apply Q24H   Continuous Infusions:      Time spent: 35 minutes    [facility]  [facility] Pager [phone] If 7PM-7AM, please contact night-coverage at [email or web address], password [facility] 08/08/2015, 12:14 PM  LOS: 1 day

## 2015-08-08 NOTE — Progress Notes (Addendum)
ANTICOAGULATION CONSULT NOTE - follow up  Pharmacy Consult for coumadin Indication: atrial fibrillation  No Known Allergies  Patient Measurements: Height: 6' (182.9 cm) Weight: 174 lb (78.926 kg) IBW/kg (Calculated) : 73.1   Vital Signs: Temp: 97.6 F (36.4 C) (01/04 0723) Temp Source: Oral (01/04 0723) BP: 143/77 mmHg (01/04 0723) Pulse Rate: 96 (01/04 0723)  Labs:  Recent Labs  08/07/15 1733 08/08/15 0619  HGB 14.1 12.9  HCT 46.7* 42.8  PLT 314 288  LABPROT 21.1* 20.7*  INR 1.83* 1.78*  CREATININE 1.00 0.91  TROPONINI <0.03  --     Estimated Creatinine Clearance: 64.5 mL/min (by C-G formula based on Cr of 0.91).   Medical History: Past Medical History  Diagnosis Date  . Hypertension   . Arthritis   . Varicose veins   . Deaf   . Elevated WBC count     and platelets  . Thyroid disease   . Irregular heart rate   . 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 Peripheral neuropathy (HCC)     feet  . 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 Atrial fibrillation, chronic ([place])   . 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 Peripheral vascular disease ([place])   . Ulcer of ankle ([place]) 05/2014    left and right ankle  . Heart failure ([place])   . Pulmonary edema   . DVT (deep venous thrombosis) (HCC)     Medications:  Prescriptions prior to admission  Medication Sig Dispense Refill Last Dose  . aspirin EC 81 MG tablet Take 81 mg by mouth daily.   08/07/2015 at Unknown time  . CARTIA XT 240 MG 24 hr capsule Take 240 mg by mouth daily.    08/07/2015 at Unknown time  . Cholecalciferol (VITAMIN D3) 5000 UNITS CAPS Take 1 capsule by mouth daily.   08/07/2015 at Unknown time  . gabapentin (NEURONTIN) 100 MG capsule Take 100 mg by mouth 3 (three) times daily.     08/07/2015 at Unknown time  . hydroxyurea (HYDREA) 500 MG capsule Take 500 mg PO BID Monday- Friday.  May take with food to minimize GI side effects. (Patient taking differently: Take 500 mg by mouth 2 (two) times daily.  Monday- Friday ONLY.  May take with food to minimize GI side effects.) 40 capsule 5 08/07/2015 at Unknown time  . levothyroxine (SYNTHROID, LEVOTHROID) 75 MCG tablet Take 75 mcg by mouth daily before breakfast.    08/07/2015 at Unknown time  . metoprolol (LOPRESSOR) 100 MG tablet Take 100 mg by mouth 2 (two) times daily.   08/07/2015 at 1700  . Potassium Chloride ER 20 MEQ TBCR Take 20 mEq by mouth daily. 30 tablet 1 08/07/2015 at Unknown time  . torsemide (DEMADEX) 20 MG tablet Take 1 tablet (20 mg total) by mouth daily. 60 tablet 1 08/07/2015 at Unknown time  . warfarin (COUMADIN) 5 MG tablet Take as directed by Coumadin Clinic (Patient taking differently: Take 5 mg by mouth. 5mg  on MWF, and take 2.5mg  on all other days. Take as directed by Coumadin Clinic) 30 tablet 3 08/06/2015 at 1800    Assessment: 75 yo lady to continue coumadin for afib.  Admission INR subtherapeutic and remains today just slightly below goal. With abx anticipate may increase INR, therefore will keep home regimen.  Goal of Therapy:  INR 2-3 Monitor platelets by anticoagulation protocol: Yes   Plan:  Coumadin 5 mg po tonight. Daily PT/INR Monitor for bleeding complications  Thanks for allowing pharmacy to be a  part of this patient's care.  Isac Sarna, BS Vena [place], BCPS Clinical Pharmacist Pager [phone]  08/08/2015,8:33 AM

## 2015-08-09 ENCOUNTER — Inpatient Hospital Stay (HOSPITAL_COMMUNITY): Payer: Medicare Other

## 2015-08-09 ENCOUNTER — Encounter (HOSPITAL_COMMUNITY): Payer: Medicare Other

## 2015-08-09 LAB — PROTIME-INR
INR: 2.24 — AB (ref 0.00–1.49)
Prothrombin Time: 24.6 seconds — ABNORMAL HIGH (ref 11.6–15.2)

## 2015-08-09 LAB — URINE CULTURE

## 2015-08-09 MED ORDER — POTASSIUM CHLORIDE CRYS ER 20 MEQ PO TBCR
40.0000 meq | EXTENDED_RELEASE_TABLET | Freq: Once | ORAL | Status: AC
  Administered 2015-08-09: 40 meq via ORAL
  Filled 2015-08-09: qty 2

## 2015-08-09 MED ORDER — WARFARIN SODIUM 5 MG PO TABS
2.5000 mg | ORAL_TABLET | Freq: Once | ORAL | Status: AC
  Administered 2015-08-09: 2.5 mg via ORAL
  Filled 2015-08-09: qty 1

## 2015-08-09 MED ORDER — FUROSEMIDE 10 MG/ML IJ SOLN
40.0000 mg | Freq: Once | INTRAMUSCULAR | Status: AC
  Administered 2015-08-09: 40 mg via INTRAVENOUS
  Filled 2015-08-09: qty 4

## 2015-08-09 NOTE — Consult Note (Signed)
   [facility] CM Inpatient Consult   08/09/2015  Charlotte [name] 1940-10-21 [id number]  Spoke with patient at bedside regarding [facility] services. Patient does not want to participate with [facility] at this time. Patient given [facility] brochure and contact information for future reference.  Of note, [facility] Care Management services would not replace or interfere with any services that are arranged by inpatient case management or social work. For additional questions or referrals please contact:  Royetta Crochet. Laymond Purser, RN, BSN, [facility] Liaison [phone]

## 2015-08-09 NOTE — Progress Notes (Signed)
ANTICOAGULATION CONSULT NOTE - follow up  Pharmacy Consult for coumadin Indication: atrial fibrillation  No Known Allergies  Patient Measurements: Height: 6' (182.9 cm) Weight: 174 lb (78.926 kg) IBW/kg (Calculated) : 73.1  Vital Signs: Temp: 98 F (36.7 C) (01/05 0652) Temp Source: Oral (01/05 0652) BP: 147/83 mmHg (01/05 0810) Pulse Rate: 85 (01/05 0810)  Labs:  Recent Labs  08/07/15 1733 08/08/15 0619 08/09/15 0602  HGB 14.1 12.9  --   HCT 46.7* 42.8  --   PLT 314 288  --   LABPROT 21.1* 20.7* 24.6*  INR 1.83* 1.78* 2.24*  CREATININE 1.00 0.91  --   TROPONINI <0.03  --   --    Estimated Creatinine Clearance: 64.5 mL/min (by C-G formula based on Cr of 0.91).  Medical History: Past Medical History  Diagnosis Date  . Hypertension   . Arthritis   . Varicose veins   . Deaf   . Elevated WBC count     and platelets  . Thyroid disease   . Irregular heart rate   . 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 Peripheral neuropathy (HCC)     feet  . 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 Atrial fibrillation, chronic ([place])   . 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 Peripheral vascular disease ([place])   . Ulcer of ankle ([place]) 05/2014    left and right ankle  . Heart failure ([place])   . Pulmonary edema   . DVT (deep venous thrombosis) (HCC)    Medications:  Prescriptions prior to admission  Medication Sig Dispense Refill Last Dose  . aspirin EC 81 MG tablet Take 81 mg by mouth daily.   08/07/2015 at Unknown time  . CARTIA XT 240 MG 24 hr capsule Take 240 mg by mouth daily.    08/07/2015 at Unknown time  . Cholecalciferol (VITAMIN D3) 5000 UNITS CAPS Take 1 capsule by mouth daily.   08/07/2015 at Unknown time  . gabapentin (NEURONTIN) 100 MG capsule Take 100 mg by mouth 3 (three) times daily.     08/07/2015 at Unknown time  . hydroxyurea (HYDREA) 500 MG capsule Take 500 mg PO BID Monday- Friday.  May take with food to minimize GI side effects. (Patient taking differently:  Take 500 mg by mouth 2 (two) times daily. Monday- Friday ONLY.  May take with food to minimize GI side effects.) 40 capsule 5 08/07/2015 at Unknown time  . levothyroxine (SYNTHROID, LEVOTHROID) 75 MCG tablet Take 75 mcg by mouth daily before breakfast.    08/07/2015 at Unknown time  . metoprolol (LOPRESSOR) 100 MG tablet Take 100 mg by mouth 2 (two) times daily.   08/07/2015 at 1700  . Potassium Chloride ER 20 MEQ TBCR Take 20 mEq by mouth daily. 30 tablet 1 08/07/2015 at Unknown time  . torsemide (DEMADEX) 20 MG tablet Take 1 tablet (20 mg total) by mouth daily. 60 tablet 1 08/07/2015 at Unknown time  . warfarin (COUMADIN) 5 MG tablet Take as directed by Coumadin Clinic (Patient taking differently: Take 5 mg by mouth. 5mg  on MWF, and take 2.5mg  on all other days. Take as directed by Coumadin Clinic) 30 tablet 3 08/06/2015 at 1800   Assessment: 75 yo lady to continue coumadin for afib.  Admission INR subtherapeutic but has now trended up to therapeutic level.  Home dose noted above.   Goal of Therapy:  INR 2-3 Monitor platelets by anticoagulation protocol: Yes   Plan:  Coumadin 2.5 mg po tonight. Daily PT/INR Monitor for  bleeding complications  Thanks for allowing pharmacy to be a part of this patient's care.  Hart Robinsons, PharmD Clinical Pharmacist  08/09/2015,1:27 PM

## 2015-08-09 NOTE — Progress Notes (Signed)
[facility] PROGRESS NOTE   Charlotte [name] [id number] DOB: 1940/12/04 DOA: 08/07/2015 PCP: [name], MD  HPI/Subjective: Patient is very hard of hearing, reported feeling much better. No fever or chills overnight.  Assessment/Plan: Principal Problem:   CAP (community acquired pneumonia) Active Problems:   Polycythemia vera ([place])   Chronic atrial fibrillation (HCC)   Peripheral vascular disease (HCC)   Dyspnea   Hard of hearing   Acute respiratory failure with hypoxia (HCC)   Chronic diastolic congestive heart failure (HCC)   CAP, likely related to influenza type A Pna pathway, rocephin and azithromycin.  Blood and sputum cx ordered. oxgyen supplentation to keep sats above 92%.  Treated with IV fluids and antibiotics in the emergency department, positive for influenza type A. As patient appears nontoxic, discontinue antibiotics and continue Tamiflu for total 5 days. Repeat x-ray, PA and lateral.  Acute on chronic respiratory failure with hypoxia Patient is oxygen, 3 L at home as needed, presented with O2 saturation of 81% on room air. Currently on 4 L of oxygen saturation is in the mid 90s. We'll try to wean off of oxygen.   Polycythemia vera Patient follows with oncology as outpatient, is on Hydrea and aspirin, continue.  Chronic atrial fibrillation Rate controlled, coumadin per pharm consult This patients CHA2DS2-VASc Score and unadjusted Ischemic Stroke Rate (% per year) is equal to 3.2 % stroke rate/year from a score of 3 for CHF, HTN and PVD Above score calculated as 1 point each if present [CHF, HTN, DM, Vascular=MI/PAD/Aortic Plaque, Age if 65-74, or Female] Above score calculated as 2 points each if present [Age > 75, or Stroke/TIA/TE]  Peripheral vascular disease No acute issues for now.  Dyspnea- due to pna  Hard of hearing, chronic conditions.  Chronic diastolic congestive heart failure Oh decompensation, given IV fluids and  diuretics held, restart diuretics. She is on Demadex at home, will start IV Lasix to keep negative fluid balance.  Code Status: Full Code Family Communication: Plan discussed with the patient. Disposition Plan: Remains inpatient Diet: Diet Heart Room service appropriate?: Yes; Fluid consistency:: Thin  Consultants:  None  Procedures:  None  Antibiotics:  None   Objective: Filed Vitals:   08/09/15 0652 08/09/15 0810  BP: 155/97 147/83  Pulse: 63 85  Temp: 98 F (36.7 C)   Resp: 20 19    Intake/Output Summary (Last 24 hours) at 08/09/15 1059 Last data filed at 08/09/15 0817  Gross per 24 hour  Intake    720 ml  Output      0 ml  Net    720 ml   Filed Weights   08/07/15 1716 08/07/15 2102  Weight: 79.833 kg (176 lb) 78.926 kg (174 lb)    Exam: General: Alert and awake, oriented x3, not in any acute distress. HEENT: anicteric sclera, pupils reactive to light and accommodation, EOMI CVS: S1-S2 clear, no murmur rubs or gallops Chest: clear to auscultation bilaterally, no wheezing, rales or rhonchi Abdomen: soft nontender, nondistended, normal bowel sounds, no organomegaly Extremities: no cyanosis, clubbing or edema noted bilaterally Neuro: Cranial nerves II-XII intact, no focal neurological deficits  Data Reviewed: Basic Metabolic Panel:  Recent Labs Lab 08/07/15 1733 08/08/15 0619  NA 143 143  K 3.8 3.7  CL 104 105  CO2 27 27  GLUCOSE 114* 140*  BUN 15 15  CREATININE 1.00 0.91  CALCIUM 9.3 8.9   Liver Function Tests: No results for input(s): AST, ALT, ALKPHOS, BILITOT, PROT, ALBUMIN in the  last 168 hours. No results for input(s): LIPASE, AMYLASE in the last 168 hours. No results for input(s): AMMONIA in the last 168 hours. CBC:  Recent Labs Lab 08/07/15 1733 08/08/15 0619  WBC 16.8* 16.6*  NEUTROABS  --  16.0*  HGB 14.1 12.9  HCT 46.7* 42.8  MCV 83.5 83.9  PLT 314 288   Cardiac Enzymes:  Recent Labs Lab 08/07/15 1733  TROPONINI  <0.03   BNP (last 3 results)  Recent Labs  11/22/14 0631  BNP 456.0*    ProBNP (last 3 results) No results for input(s): PROBNP in the last 8760 hours.  CBG: No results for input(s): GLUCAP in the last 168 hours.  Micro Recent Results (from the past 240 hour(s))  Blood culture (routine x 2)     Status: None (Preliminary result)   Collection Time: 08/07/15  6:48 PM  Result Value Ref Range Status   Specimen Description BLOOD LEFT ARM  Final   Special Requests BOTTLES DRAWN AEROBIC AND ANAEROBIC 6CC EACH  Final   Culture NO GROWTH 2 DAYS  Final   Report Status PENDING  Incomplete  Blood culture (routine x 2)     Status: None (Preliminary result)   Collection Time: 08/07/15  6:51 PM  Result Value Ref Range Status   Specimen Description BLOOD LEFT ARM  Final   Special Requests   Final    BOTTLES DRAWN AEROBIC AND ANAEROBIC AEB=8CC ANA=6CC   Culture NO GROWTH 2 DAYS  Final   Report Status PENDING  Incomplete  Urine culture     Status: None (Preliminary result)   Collection Time: 08/08/15  4:15 AM  Result Value Ref Range Status   Specimen Description URINE, CLEAN CATCH  Final   Special Requests NONE  Final   Culture   Final    TOO YOUNG TO READ Performed at [facility]    Report Status PENDING  Incomplete     Studies: Dg Chest 1 View  08/07/2015  CLINICAL DATA:  75 year old female with a history of shortness of breath. EXAM: CHEST 1 VIEW COMPARISON:  CT 10/14/2013, chest x-ray 11/22/2014, 11/23/2014, 11/27/2014 FINDINGS: Cardiomediastinal silhouette again demonstrates cardiomegaly. More pronounced than the comparison. Bilateral dense basilar opacities, greater on the left. Obscuration the left hemidiaphragm and left heart border. Blunting of the right costophrenic angle. Airspace opacity at the right base. Interlobular septal thickening with coarsened interstitial markings and prominence of the central vasculature. IMPRESSION: Bilateral basilar opacities, worst on the  left, may reflect a combination of edema, left greater than right pleural effusion, and/ or consolidation. Signed, Dulcy Fanny. Earleen Newport, DO [facility] Electronically Signed   By: Corrie Mckusick D.O.   On: 08/07/2015 18:10    Scheduled Meds: . antiseptic oral rinse  7 mL Mouth Rinse BID  . aspirin EC  81 mg Oral Daily  . cholecalciferol  5,000 Units Oral Daily  . diltiazem  240 mg Oral Daily  . gabapentin  100 mg Oral TID  . guaiFENesin  1,200 mg Oral BID  . levothyroxine  75 mcg Oral QAC breakfast  . metoprolol  100 mg Oral BID  . oseltamivir  75 mg Oral BID  . torsemide  20 mg Oral Daily  . Warfarin - Pharmacist Dosing Inpatient   Does not apply Q24H   Continuous Infusions:      Time spent: 35 minutes    [facility]  [facility] Pager [phone] If 7PM-7AM, please contact night-coverage at [email or web address],  password [email or web address] 08/09/2015, 10:59 AM  LOS: 2 days

## 2015-08-10 LAB — RENAL FUNCTION PANEL
ALBUMIN: 3.4 g/dL — AB (ref 3.5–5.0)
ANION GAP: 10 (ref 5–15)
BUN: 19 mg/dL (ref 6–20)
CO2: 29 mmol/L (ref 22–32)
Calcium: 8.8 mg/dL — ABNORMAL LOW (ref 8.9–10.3)
Chloride: 103 mmol/L (ref 101–111)
Creatinine, Ser: 0.82 mg/dL (ref 0.44–1.00)
GFR calc Af Amer: >60 mL/min (ref >60)
GFR calc non Af Amer: >60 mL/min (ref >60)
GLUCOSE: 77 mg/dL (ref 65–99)
Phosphorus: 3.1 mg/dL (ref 2.5–4.6)
Potassium: 3.6 mmol/L (ref 3.5–5.1)
SODIUM: 142 mmol/L (ref 135–145)

## 2015-08-10 LAB — PROTIME-INR
INR: 2.04 — AB (ref 0.00–1.49)
Prothrombin Time: 22.9 seconds — ABNORMAL HIGH (ref 11.6–15.2)

## 2015-08-10 MED ORDER — WARFARIN SODIUM 5 MG PO TABS
5.0000 mg | ORAL_TABLET | Freq: Once | ORAL | Status: DC
Start: 2015-08-10 — End: 2015-08-10

## 2015-08-10 MED ORDER — OSELTAMIVIR PHOSPHATE 75 MG PO CAPS: 75.0000 mg | ORAL_CAPSULE | Freq: Two times a day (BID) | ORAL | Status: DC

## 2015-08-10 NOTE — Discharge Summary (Signed)
Physician Discharge Summary  Charlotte [name] [id number] DOB: 02-19-1941 DOA: 08/07/2015  PCP: [name], MD  Admit date: 08/07/2015 Discharge date: 08/10/2015  Time spent: 40 minutes  Recommendations for Outpatient Follow-up:  1. Follow-up with primary care physician within one week.   Discharge Diagnoses:  Principal Problem:   CAP (community acquired pneumonia) Active Problems:   Polycythemia vera ([place])   Chronic atrial fibrillation (HCC)   Peripheral vascular disease ([place])   Dyspnea   Hard of hearing   Acute respiratory failure with hypoxia (HCC)   Chronic diastolic congestive heart failure ([place])   Discharge Condition: Stable  Diet recommendation: Heart healthy  Filed Weights   08/07/15 1716 08/07/15 2102  Weight: 79.833 kg (176 lb) 78.926 kg (174 lb)    History of present illness:  75 yo female h/o htn, afib on coumadin, chf, on 3 liters oxygen at home prn comes in with sob, chills , cough , weakness for a day. She has had a lot of nasal congestion, coughing a lot last night. Today got worse sob, and started having chills. Came to the ED mainly for her dyspnea. No swelling in legs. She did not check her temperature. No sick contacts. No recent abx. Found to have pna. Pt oxygen sats on arrival were 81% on RA. She feels better now that she has oxygen on and given some ivf. referrred for admission for her hypoxia and pna.  Hospital Course:   CAP, likely related to influenza type A Started initially on PNA pathway, rocephin and azithromycin.  Blood and sputum cx ordered. oxgyen supplentation to keep sats above 92%.  Treated with IV fluids and antibiotics in the ED, positive for influenza type A. As patient appears nontoxic, discontinue antibiotics and continue Tamiflu for total 5 days. Repeat x-ray, PA and lateral showed improvement on x-ray, discharge and Tamiflu to complete total of 5 days.  Acute on chronic respiratory failure with  hypoxia Patient is oxygen, 3 L at home as needed, presented with O2 saturation of 81% on room air. Patient currently on 3 L of oxygen, continue home oxygen.  Polycythemia vera Patient follows with oncology as outpatient, is on Hydrea and aspirin, continue.  Chronic atrial fibrillation Rate controlled, Coumadin continued per pharmacy consultation. This patients CHA2DS2-VASc Score and unadjusted Ischemic Stroke Rate (% per year) is equal to 3.2 % stroke rate/year from a score of 3 for CHF, HTN and PVD Above score calculated as 1 point each if present [CHF, HTN, DM, Vascular=MI/PAD/Aortic Plaque, Age if 65-74, or Female] Above score calculated as 2 points each if present [Age > 75, or Stroke/TIA/TE]  Peripheral vascular disease No acute issues for now.  Hard of hearing, chronic conditions.  Chronic diastolic congestive heart failure No decompensation, given IV fluids and diuretics held, restart diuretics. She is on Demadex at home, diuretics restarted on discharge.  Procedures:  None  Consultations:  None  Discharge Exam: Filed Vitals:   08/09/15 2243 08/10/15 0547  BP: 152/87 150/80  Pulse: 96 85  Temp: 97.7 F (36.5 C) 97.9 F (36.6 C)  Resp: 18 18   General: Alert and awake, oriented x3, not in any acute distress. HEENT: anicteric sclera, pupils reactive to light and accommodation, EOMI CVS: S1-S2 clear, no murmur rubs or gallops Chest: clear to auscultation bilaterally, no wheezing, rales or rhonchi Abdomen: soft nontender, nondistended, normal bowel sounds, no organomegaly Extremities: no cyanosis, clubbing or edema noted bilaterally Neuro: Cranial nerves II-XII intact, no focal neurological deficits  Discharge  Instructions   Discharge Instructions    Diet - low sodium heart healthy    Complete by:  As directed      Increase activity slowly    Complete by:  As directed           Current Discharge Medication List    START taking these medications    Details  oseltamivir (TAMIFLU) 75 MG capsule Take 1 capsule (75 mg total) by mouth 2 (two) times daily. Qty: 6 capsule, Refills: 0      CONTINUE these medications which have NOT CHANGED   Details  aspirin EC 81 MG tablet Take 81 mg by mouth daily.    CARTIA XT 240 MG 24 hr capsule Take 240 mg by mouth daily.     Cholecalciferol (VITAMIN D3) 5000 UNITS CAPS Take 1 capsule by mouth daily.    gabapentin (NEURONTIN) 100 MG capsule Take 100 mg by mouth 3 (three) times daily.      hydroxyurea (HYDREA) 500 MG capsule Take 500 mg PO BID Monday- Friday.  May take with food to minimize GI side effects. Qty: 40 capsule, Refills: 5   Associated Diagnoses: Polycythemia vera (HCC)    levothyroxine (SYNTHROID, LEVOTHROID) 75 MCG tablet Take 75 mcg by mouth daily before breakfast.    Associated Diagnoses: Polycythemia vera (HCC)    metoprolol (LOPRESSOR) 100 MG tablet Take 100 mg by mouth 2 (two) times daily.    Potassium Chloride ER 20 MEQ TBCR Take 20 mEq by mouth daily. Qty: 30 tablet, Refills: 1    torsemide (DEMADEX) 20 MG tablet Take 1 tablet (20 mg total) by mouth daily. Qty: 60 tablet, Refills: 1    warfarin (COUMADIN) 5 MG tablet Take as directed by [facility] Clinic Qty: 30 tablet, Refills: 3       No Known Allergies Follow-up Information    Follow up with [name], MD In 1 week.   Specialty:  Family Medicine   Contact information:   [address] [place] [id number] [phone]        The results of significant diagnostics from this hospitalization (including imaging, microbiology, ancillary and laboratory) are listed below for reference.    Significant Diagnostic Studies: Dg Chest 1 View  08/07/2015  CLINICAL DATA:  75 year old female with a history of shortness of breath. EXAM: CHEST 1 VIEW COMPARISON:  CT 10/14/2013, chest x-ray 11/22/2014, 11/23/2014, 11/27/2014 FINDINGS: Cardiomediastinal silhouette again demonstrates cardiomegaly. More pronounced  than the comparison. Bilateral dense basilar opacities, greater on the left. Obscuration the left hemidiaphragm and left heart border. Blunting of the right costophrenic angle. Airspace opacity at the right base. Interlobular septal thickening with coarsened interstitial markings and prominence of the central vasculature. IMPRESSION: Bilateral basilar opacities, worst on the left, may reflect a combination of edema, left greater than right pleural effusion, and/ or consolidation. Signed, Dulcy Fanny. Earleen Newport, DO [facility]iology Electronically Signed   By: Corrie Mckusick D.O.   On: 08/07/2015 18:10   Dg Chest 2 View  08/09/2015  CLINICAL DATA:  Shortness of breath, pleural effusion EXAM: CHEST  2 VIEW COMPARISON:  08/07/2015 FINDINGS: Cardiomediastinal silhouette is stable. Central mild vascular congestion without convincing pulmonary edema. Trace right pleural effusion. Stable left pleural effusion with left lower lobe atelectasis or infiltrate. Mild degenerative changes thoracic spine again noted. IMPRESSION: Central mild vascular congestion without convincing pulmonary edema. Trace right pleural effusion. Stable left pleural effusion with left lower lobe atelectasis or infiltrate. Electronically Signed  By: Lahoma Crocker M.D.   On: 08/09/2015 14:48    Microbiology: Recent Results (from the past 240 hour(s))  Blood culture (routine x 2)     Status: None (Preliminary result)   Collection Time: 08/07/15  6:48 PM  Result Value Ref Range Status   Specimen Description BLOOD LEFT ARM  Final   Special Requests BOTTLES DRAWN AEROBIC AND ANAEROBIC 6CC EACH  Final   Culture NO GROWTH 3 DAYS  Final   Report Status PENDING  Incomplete  Blood culture (routine x 2)     Status: None (Preliminary result)   Collection Time: 08/07/15  6:51 PM  Result Value Ref Range Status   Specimen Description BLOOD LEFT ARM  Final   Special Requests   Final    BOTTLES DRAWN AEROBIC  AND ANAEROBIC AEB=8CC ANA=6CC   Culture NO GROWTH 3 DAYS  Final   Report Status PENDING  Incomplete  Urine culture     Status: None   Collection Time: 08/08/15  4:15 AM  Result Value Ref Range Status   Specimen Description URINE, CLEAN CATCH  Final   Special Requests NONE  Final   Culture   Final    MULTIPLE SPECIES PRESENT, SUGGEST RECOLLECTION Performed at [facility]    Report Status 08/09/2015 FINAL  Final     Labs: Basic Metabolic Panel:  Recent Labs Lab 08/07/15 1733 08/08/15 0619 08/10/15 0620  NA 143 143 142  K 3.8 3.7 3.6  CL 104 105 103  CO2 27 27 29   GLUCOSE 114* 140* 77  BUN 15 15 19   CREATININE 1.00 0.91 0.82  CALCIUM 9.3 8.9 8.8*  PHOS  --   --  3.1   Liver Function Tests:  Recent Labs Lab 08/10/15 0620  ALBUMIN 3.4*   No results for input(s): LIPASE, AMYLASE in the last 168 hours. No results for input(s): AMMONIA in the last 168 hours. CBC:  Recent Labs Lab 08/07/15 1733 08/08/15 0619  WBC 16.8* 16.6*  NEUTROABS  --  16.0*  HGB 14.1 12.9  HCT 46.7* 42.8  MCV 83.5 83.9  PLT 314 288   Cardiac Enzymes:  Recent Labs Lab 08/07/15 1733  TROPONINI <0.03   BNP: BNP (last 3 results)  Recent Labs  11/22/14 0631  BNP 456.0*    ProBNP (last 3 results) No results for input(s): PROBNP in the last 8760 hours.  CBG: No results for input(s): GLUCAP in the last 168 hours.     Signed:  Birdie Hopes MD  [facility] 08/10/2015, 10:17 AM

## 2015-08-10 NOTE — Care Management Note (Signed)
Case Management Note  Patient Details  Name: Charlotte [name] MRN: [id number] Date of Birth: 1940-10-21  Subjective/Objective:                    Action/Plan:   Expected Discharge Date:  08/09/15               Expected Discharge Plan:  Home/Self Care  In-House Referral:  NA  Discharge planning Services  CM Consult  Post Acute Care Choice:  NA Choice offered to:  NA  DME Arranged:    DME Agency:     HH Arranged:    [place] Agency:     Status of Service:  Completed, signed off  Medicare Important Message Given:  Yes Date Medicare IM Given:    Medicare IM give by:    Date Additional Medicare IM Given:    Additional Medicare Important Message give by:     If discussed at [place] of Stay Meetings, dates discussed:    Additional Comments: Pt discharged home today. No CM needs noted. Christinia Gully Wickliffe, RN 08/10/2015, 10:40 AM

## 2015-08-10 NOTE — Progress Notes (Signed)
ANTICOAGULATION CONSULT NOTE - follow up  Pharmacy Consult for coumadin Indication: atrial fibrillation  No Known Allergies  Patient Measurements: Height: 6' (182.9 cm) Weight: 174 lb (78.926 kg) IBW/kg (Calculated) : 73.1  Vital Signs: Temp: 97.9 F (36.6 C) (01/06 0547) Temp Source: Oral (01/06 0547) BP: 150/80 mmHg (01/06 0547) Pulse Rate: 85 (01/06 0547)  Labs:  Recent Labs  08/07/15 1733 08/08/15 0619 08/09/15 0602 08/10/15 0620  HGB 14.1 12.9  --   --   HCT 46.7* 42.8  --   --   PLT 314 288  --   --   LABPROT 21.1* 20.7* 24.6* 22.9*  INR 1.83* 1.78* 2.24* 2.04*  CREATININE 1.00 0.91  --  0.82  TROPONINI <0.03  --   --   --    Estimated Creatinine Clearance: 71.6 mL/min (by C-G formula based on Cr of 0.82).  Medical History: Past Medical History  Diagnosis Date  . Hypertension   . Arthritis   . Varicose veins   . Deaf   . Elevated WBC count     and platelets  . Thyroid disease   . Irregular heart rate   . 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 Peripheral neuropathy (HCC)     feet  . 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 Atrial fibrillation, chronic ([place])   . 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 Peripheral vascular disease ([place])   . Ulcer of ankle ([place]) 05/2014    left and right ankle  . Heart failure ([place])   . Pulmonary edema   . DVT (deep venous thrombosis) (HCC)    Medications:  Prescriptions prior to admission  Medication Sig Dispense Refill Last Dose  . aspirin EC 81 MG tablet Take 81 mg by mouth daily.   08/07/2015 at Unknown time  . CARTIA XT 240 MG 24 hr capsule Take 240 mg by mouth daily.    08/07/2015 at Unknown time  . Cholecalciferol (VITAMIN D3) 5000 UNITS CAPS Take 1 capsule by mouth daily.   08/07/2015 at Unknown time  . gabapentin (NEURONTIN) 100 MG capsule Take 100 mg by mouth 3 (three) times daily.     08/07/2015 at Unknown time  . hydroxyurea (HYDREA) 500 MG capsule Take 500 mg PO BID Monday- Friday.  May take with food to  minimize GI side effects. (Patient taking differently: Take 500 mg by mouth 2 (two) times daily. Monday- Friday ONLY.  May take with food to minimize GI side effects.) 40 capsule 5 08/07/2015 at Unknown time  . levothyroxine (SYNTHROID, LEVOTHROID) 75 MCG tablet Take 75 mcg by mouth daily before breakfast.    08/07/2015 at Unknown time  . metoprolol (LOPRESSOR) 100 MG tablet Take 100 mg by mouth 2 (two) times daily.   08/07/2015 at 1700  . Potassium Chloride ER 20 MEQ TBCR Take 20 mEq by mouth daily. 30 tablet 1 08/07/2015 at Unknown time  . torsemide (DEMADEX) 20 MG tablet Take 1 tablet (20 mg total) by mouth daily. 60 tablet 1 08/07/2015 at Unknown time  . warfarin (COUMADIN) 5 MG tablet Take as directed by [facility] (Patient taking differently: Take 5 mg by mouth. 5mg  on MWF, and take 2.5mg  on all other days. Take as directed by [facility]in Clinic) 30 tablet 3 08/06/2015 at 1800   Assessment: 75 yo lady to continue coumadin for afib.  Admission INR subtherapeutic but has now trended up to therapeutic level.  Home dose noted above.   Goal of Therapy:  INR 2-3 Monitor platelets  by anticoagulation protocol: Yes   Plan:  Coumadin 5 mg po tonight. Daily PT/INR Monitor for bleeding complications  Thanks for allowing pharmacy to be a part of this patient's care.  Excell Seltzer, PharmD 08/10/2015,8:35 AM

## 2015-08-10 NOTE — Care Management Important Message (Signed)
Important Message  Patient Details  Name: Charlotte [name] MRN: [id number] Date of Birth: 26-Nov-1940   Medicare Important Message Given:  Yes    Joylene Draft, RN 08/10/2015, 10:40 AM

## 2015-08-10 NOTE — Progress Notes (Signed)
Discharge instructions read to patient and family. All questions were satisfied.  Discharged to home with family.

## 2015-08-12 LAB — CULTURE, BLOOD (ROUTINE X 2)
CULTURE: NO GROWTH
CULTURE: NO GROWTH

## 2015-08-16 DIAGNOSIS — Z1389 Encounter for screening for other disorder: Secondary | ICD-10-CM | POA: Diagnosis not present

## 2015-08-16 DIAGNOSIS — I5030 Unspecified diastolic (congestive) heart failure: Secondary | ICD-10-CM | POA: Diagnosis not present

## 2015-08-16 DIAGNOSIS — J111 Influenza due to unidentified influenza virus with other respiratory manifestations: Secondary | ICD-10-CM | POA: Diagnosis not present

## 2015-08-16 DIAGNOSIS — Z6823 Body mass index (BMI) 23.0-23.9, adult: Secondary | ICD-10-CM | POA: Diagnosis not present

## 2015-08-20 ENCOUNTER — Ambulatory Visit (INDEPENDENT_AMBULATORY_CARE_PROVIDER_SITE_OTHER): Payer: Medicare Other | Admitting: *Deleted

## 2015-08-20 DIAGNOSIS — Z5181 Encounter for therapeutic drug level monitoring: Secondary | ICD-10-CM | POA: Diagnosis not present

## 2015-08-20 DIAGNOSIS — I482 Chronic atrial fibrillation, unspecified: Secondary | ICD-10-CM

## 2015-08-20 LAB — POCT INR: INR: 2.8

## 2015-09-05 NOTE — Progress Notes (Signed)
Charlotte Kilts, MD [place] [id number]  Polycythemia vera [facility])  CURRENT THERAPY: Hydrea 500 mg BID M-F and intermittent therapeutic phlebotomies.  Last one was on 05/18/2015.  INTERVAL HISTORY: Charlotte [name] 75 y.o. female returns for followup of JAK 2 positive MPD, P. Vera.   I personally reviewed and went over laboratory results with the patient.  The results are noted within this dictation.  Labs updated today demonstrating a Hgb WNL and platelet count WNL with a HCT < 45%.  Chart reviewed.  Hospitalization noted from 08/07/2015- 08/10/2015 for CAP.  She reports complete recovery from this hospitalization.    She is oxygen dependent and uses O2 at home as needed.  Her O2 sat today in the clinic on RA is 90%.    She reports compliance with Hydrea.  She denies any missed doses.  ROS questioning is negative.  Past Medical History  Diagnosis Date  . Hypertension   . Arthritis   . Varicose veins   . Deaf   . Elevated WBC count     and platelets  . Thyroid disease   . Irregular heart rate   . 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 Peripheral neuropathy (HCC)     feet  . 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 Atrial fibrillation, chronic ([place])   . 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 Peripheral vascular disease ([place])   . Ulcer of ankle ([place]) 05/2014    left and right ankle  . Heart failure ([place])   . Pulmonary edema   . 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     has No Known Allergies.  Current Outpatient Prescriptions on File Prior to Visit  Medication Sig Dispense Refill  . aspirin EC 81 MG tablet Take 81 mg by mouth daily.    Marland Kitchen CARTIA XT 240 MG 24 hr capsule Take 240 mg by mouth daily.     . Cholecalciferol (VITAMIN D3) 5000 UNITS CAPS Take 1 capsule by mouth daily.    Marland Kitchen gabapentin (NEURONTIN) 100 MG capsule Take 100 mg by mouth 3 (three) times daily.      . hydroxyurea (HYDREA) 500 MG capsule Take 500 mg PO BID Monday- Friday.  May take with food to minimize GI side effects. (Patient taking differently: Take 500 mg by mouth 2 (two) times daily. Monday- Friday ONLY.  May take with food to minimize GI side effects.) 40 capsule 5  . levothyroxine (SYNTHROID, LEVOTHROID) 75 MCG tablet Take 75 mcg by mouth daily before breakfast.     . metoprolol (LOPRESSOR) 100 MG tablet Take 100 mg by mouth 2 (two) times daily.    Marland Kitchen oseltamivir (TAMIFLU) 75 MG capsule Take 1 capsule (75 mg total) by mouth 2 (  two) times daily. 6 capsule 0  . Potassium Chloride ER 20 MEQ TBCR Take 20 mEq by mouth daily. 30 tablet 1  . torsemide (DEMADEX) 20 MG tablet Take 1 tablet (20 mg total) by mouth daily. 60 tablet 1  . warfarin (COUMADIN) 5 MG tablet Take as directed by [facility]madin Clinic (Patient taking differently: Take 5 mg by mouth. 5mg  on MWF, and take 2.5mg  on all other days. Take as directed by Coumadin Clinic) 30 tablet 3   No current facility-administered medications on file prior to visit.    Past Surgical History  Procedure Laterality Date  . Blt     . Abdominal hysterectomy    . Coronary angioplasty    . Ct of abd    . Hip pinning,cannulated  11/19/2011    Procedure: CANNULATED HIP PINNING;  Surgeon: Sanjuana Kava, MD;  [facility];  Service: Orthopedics;  Laterality: Right;  . Fasciotomy  11/29/2011    Procedure: FASCIOTOMY;  Surgeon: Carole Civil, MD;  Location: [facility];  Service: Orthopedics;  Laterality: Right;  right thigh   . Dressing change under anesthesia  12/02/2011    Procedure: DRESSING CHANGE UNDER ANESTHESIA;  Surgeon: Carole Civil, MD;  Location: [facility];  Service: Orthopedics;  Laterality: Right;  . Endarterectomy femoral Right 09/15/2012    Procedure: ENDARTERECTOMY FEMORAL;  Surgeon: Mal Misty, MD;  Location: [place];  Service: Vascular;  Laterality: Right;  . Femoral-popliteal bypass graft Right 09/15/2012    Procedure: BYPASS GRAFT FEMORAL-POPLITEAL ARTERY;  Surgeon: Mal Misty, MD;  Location: [place];  Service: Vascular;  Laterality: Right;  . Patch angioplasty Right 09/15/2012    Procedure: PATCH ANGIOPLASTY;  Surgeon: Mal Misty, MD;  Location: [place];  Service: Vascular;  Laterality: Right;  . Abdominal aortagram N/A 09/14/2012    Procedure: ABDOMINAL [name];  Surgeon: Serafina Mitchell, MD;  Location: [facility] CATH LAB;  Service: Cardiovascular;  Laterality: N/A;    Denies any headaches, dizziness, double vision, fevers, chills, night sweats, nausea, vomiting, diarrhea, constipation, chest pain, heart palpitations, shortness of breath, blood in stool, black tarry stool, urinary pain, urinary burning, urinary frequency, hematuria.   PHYSICAL EXAMINATION  ECOG PERFORMANCE STATUS: 1 - Symptomatic but completely ambulatory  Filed Vitals:   09/06/15 1300  BP: 131/75  Pulse: 76  Temp: 97.9 F (36.6 C)  Resp: 18    GENERAL:alert, no distress, well nourished, well developed, comfortable, cooperative, smiling and accompanied by husband. SKIN: skin color, texture, turgor are normal, no rashes or  significant lesions HEAD: Normocephalic, No masses, lesions, tenderness or abnormalities EYES: normal, PERRLA, EOMI, Conjunctiva are pink and non-injected EARS: External ears normal OROPHARYNX:lips, buccal mucosa, and tongue normal and mucous membranes are moist  NECK: supple, no adenopathy, thyroid normal size, non-tender, without nodularity, trachea midline LYMPH:  no palpable lymphadenopathy BREAST:not examined LUNGS: clear to auscultation and percussion HEART: irregularly irregular without murmur ABDOMEN:abdomen soft, non-tender and normal bowel sounds BACK: Back symmetric, no curvature. EXTREMITIES:less then 2 second capillary refill, no joint deformities, effusion, or inflammation, no skin discoloration, no cyanosis  NEURO: alert & oriented x 3 with fluent speech, no focal motor/sensory deficits   LABORATORY DATA: CBC    Component Value Date/Time   WBC 18.9* 09/06/2015 1157   RBC 5.30* 09/06/2015 1157   RBC 3.48* 08/19/2013 0828   HGB 13.0 09/06/2015 1157   HCT 43.3 09/06/2015 1157   PLT 300 09/06/2015 1157   MCV 81.7 09/06/2015 1157   MCH 24.5*  09/06/2015 1157   MCHC 30.0 09/06/2015 1157   RDW 21.9* 09/06/2015 1157   LYMPHSABS 1.6 09/06/2015 1157   MONOABS 0.6 09/06/2015 1157   EOSABS 0.4 09/06/2015 1157   BASOSABS 0.4* 09/06/2015 1157      Chemistry      Component Value Date/Time   NA 142 08/10/2015 0620   K 3.6 08/10/2015 0620   CL 103 08/10/2015 0620   CO2 29 08/10/2015 0620   BUN 19 08/10/2015 0620   CREATININE 0.82 08/10/2015 0620   CREATININE 1.07 12/04/2014 1248      Component Value Date/Time   CALCIUM 8.8* 08/10/2015 0620   ALKPHOS 96 12/21/2014 1104   AST 18 12/21/2014 1104   ALT 10* 12/21/2014 1104   BILITOT 1.5* 12/21/2014 1104       ASSESSMENT AND PLAN:  Polycythemia vera ([place]) JAK 2 positive MPD, P. Vera. Currently on Hydrea 500 mg BID M-F with a history of interruption of therapy secondary symptomatic pancytopenia.  Counts normalized  with cessation of Hydrea, but thrombocytosis was noted.  She is not a candidate for Anagrelide given her cardiac issue(s).  Hydrea was restarted and she is very stable at the current dose of 500 mg BID M-F with intermittent therapeutic phlebotomy.  Labs today: CBC diff.  Labs every 4 weeks: CBC diff  Return in 12  weeks for follow-up.  Continue same dose of Hydrea (500 mg BID).  Will adjust dose if necessary.   THERAPY PLAN:  Continue with Hydrea as prescribed.  Will adjust dose if necessary.  All questions were answered. The patient knows to call the clinic with any problems, questions or concerns. We can certainly see the patient much sooner if necessary.  Patient and plan discussed with Dr. Ancil Linsey and she is in agreement with the aforementioned.   This note is electronically signed by: Doy Mince 09/06/2015 1:44 PM

## 2015-09-05 NOTE — Assessment & Plan Note (Addendum)
JAK 2 positive MPD, P. Vera. Currently on Hydrea 500 mg BID M-F with a history of interruption of therapy secondary symptomatic pancytopenia.  Counts normalized with cessation of Hydrea, but thrombocytosis was noted.  She is not a candidate for Anagrelide given her cardiac issue(s).  Hydrea was restarted and she is very stable at the current dose of 500 mg BID M-F with intermittent therapeutic phlebotomy.  Labs today: CBC diff.  Labs every 4 weeks: CBC diff  Return in 12  weeks for follow-up.  Continue same dose of Hydrea (500 mg BID).  Will adjust dose if necessary.

## 2015-09-06 ENCOUNTER — Encounter (HOSPITAL_COMMUNITY): Payer: Self-pay | Admitting: Oncology

## 2015-09-06 ENCOUNTER — Encounter (HOSPITAL_COMMUNITY): Payer: Medicare Other | Attending: Hematology and Oncology | Admitting: Oncology

## 2015-09-06 ENCOUNTER — Encounter (HOSPITAL_COMMUNITY): Payer: Medicare Other

## 2015-09-06 VITALS — BP 131/75 | HR 76 | Temp 97.9°F | Resp 18 | Wt 179.0 lb

## 2015-09-06 DIAGNOSIS — D45 Polycythemia vera: Secondary | ICD-10-CM | POA: Insufficient documentation

## 2015-09-06 LAB — CBC WITH DIFFERENTIAL/PLATELET
Basophils Absolute: 0.4 10*3/uL — ABNORMAL HIGH (ref 0.0–0.1)
Basophils Relative: 2 %
Eosinophils Absolute: 0.4 10*3/uL (ref 0.0–0.7)
Eosinophils Relative: 2 %
HEMATOCRIT: 43.3 % (ref 36.0–46.0)
Hemoglobin: 13 g/dL (ref 12.0–15.0)
LYMPHS PCT: 9 %
Lymphs Abs: 1.6 10*3/uL (ref 0.7–4.0)
MCH: 24.5 pg — AB (ref 26.0–34.0)
MCHC: 30 g/dL (ref 30.0–36.0)
MCV: 81.7 fL (ref 78.0–100.0)
MONO ABS: 0.6 10*3/uL (ref 0.1–1.0)
MONOS PCT: 3 %
NEUTROS ABS: 15.9 10*3/uL — AB (ref 1.7–7.7)
Neutrophils Relative %: 84 %
Platelets: 300 10*3/uL (ref 150–400)
RBC: 5.3 MIL/uL — ABNORMAL HIGH (ref 3.87–5.11)
RDW: 21.9 % — AB (ref 11.5–15.5)
WBC: 18.9 10*3/uL — ABNORMAL HIGH (ref 4.0–10.5)

## 2015-09-06 NOTE — Patient Instructions (Signed)
[place] at [facility] Discharge Instructions  RECOMMENDATIONS MADE BY THE CONSULTANT AND ANY TEST RESULTS WILL BE SENT TO YOUR REFERRING PHYSICIAN.  Exam and discussion today with Kirby Crigler, PA-C. Continue Hydrea 500 mg twice a day on Monday-Friday. Lab work every 4 weeks. Office visit in 12 weeks with Dr. Whitney Muse.   Thank you for choosing [place] at [facility] to provide your oncology and hematology care.  To afford each patient quality time with our provider, please arrive at least 15 minutes before your scheduled appointment time.   Beginning January 23rd 2017 lab work for the [facility] will be done in the  Main lab at [facility] on 1st floor. If you have a lab appointment with the [facility] please come in thru the  Main Entrance and check in at the main information desk  You need to re-schedule your appointment should you arrive 10 or more minutes late.  We strive to give you quality time with our providers, and arriving late affects you and other patients whose appointments are after yours.  Also, if you no show three or more times for appointments you may be dismissed from the clinic at the providers discretion.     Again, thank you for choosing [facility].  Our hope is that these requests will decrease the amount of time that you wait before being seen by our physicians.       _____________________________________________________________  Should you have questions after your visit to [facility], please contact our office at [phone] between the hours of 8:30 a.m. and 4:30 p.m.  Voicemails left after 4:30 p.m. will not be returned until the following business day.  For prescription refill requests, have your pharmacy contact our office.

## 2015-09-27 ENCOUNTER — Encounter (HOSPITAL_COMMUNITY): Payer: Medicare Other

## 2015-09-27 DIAGNOSIS — D45 Polycythemia vera: Secondary | ICD-10-CM

## 2015-09-27 LAB — CBC WITH DIFFERENTIAL/PLATELET
BASOS PCT: 2 %
Basophils Absolute: 0.4 10*3/uL — ABNORMAL HIGH (ref 0.0–0.1)
Eosinophils Absolute: 0.3 10*3/uL (ref 0.0–0.7)
Eosinophils Relative: 2 %
HEMATOCRIT: 44.1 % (ref 36.0–46.0)
HEMOGLOBIN: 13.2 g/dL (ref 12.0–15.0)
LYMPHS ABS: 1.2 10*3/uL (ref 0.7–4.0)
LYMPHS PCT: 6 %
MCH: 24.4 pg — ABNORMAL LOW (ref 26.0–34.0)
MCHC: 29.9 g/dL — AB (ref 30.0–36.0)
MCV: 81.7 fL (ref 78.0–100.0)
MONO ABS: 0.6 10*3/uL (ref 0.1–1.0)
MONOS PCT: 3 %
NEUTROS ABS: 17.1 10*3/uL — AB (ref 1.7–7.7)
NEUTROS PCT: 88 %
Platelets: 320 10*3/uL (ref 150–400)
RBC: 5.4 MIL/uL — ABNORMAL HIGH (ref 3.87–5.11)
RDW: 22.1 % — AB (ref 11.5–15.5)
WBC: 19.5 10*3/uL — ABNORMAL HIGH (ref 4.0–10.5)

## 2015-10-01 ENCOUNTER — Ambulatory Visit (INDEPENDENT_AMBULATORY_CARE_PROVIDER_SITE_OTHER): Payer: Medicare Other | Admitting: *Deleted

## 2015-10-01 DIAGNOSIS — I482 Chronic atrial fibrillation, unspecified: Secondary | ICD-10-CM

## 2015-10-01 DIAGNOSIS — Z5181 Encounter for therapeutic drug level monitoring: Secondary | ICD-10-CM | POA: Diagnosis not present

## 2015-10-01 LAB — POCT INR: INR: 2.6

## 2015-10-18 ENCOUNTER — Encounter (HOSPITAL_COMMUNITY): Payer: Medicare Other | Attending: Hematology and Oncology

## 2015-10-18 DIAGNOSIS — D45 Polycythemia vera: Secondary | ICD-10-CM | POA: Insufficient documentation

## 2015-10-18 LAB — CBC WITH DIFFERENTIAL/PLATELET
BASOS PCT: 1 %
Basophils Absolute: 0.2 10*3/uL — ABNORMAL HIGH (ref 0.0–0.1)
EOS ABS: 0.3 10*3/uL (ref 0.0–0.7)
Eosinophils Relative: 2 %
HCT: 44.7 % (ref 36.0–46.0)
Hemoglobin: 13.1 g/dL (ref 12.0–15.0)
Lymphocytes Relative: 5 %
Lymphs Abs: 1 10*3/uL (ref 0.7–4.0)
MCH: 24.1 pg — ABNORMAL LOW (ref 26.0–34.0)
MCHC: 29.3 g/dL — AB (ref 30.0–36.0)
MCV: 82.2 fL (ref 78.0–100.0)
MONO ABS: 0.5 10*3/uL (ref 0.1–1.0)
MONOS PCT: 3 %
Neutro Abs: 17.1 10*3/uL — ABNORMAL HIGH (ref 1.7–7.7)
Neutrophils Relative %: 89 %
PLATELETS: 497 10*3/uL — AB (ref 150–400)
RBC: 5.44 MIL/uL — ABNORMAL HIGH (ref 3.87–5.11)
RDW: 22.2 % — AB (ref 11.5–15.5)
WBC: 19.2 10*3/uL — ABNORMAL HIGH (ref 4.0–10.5)

## 2015-11-05 ENCOUNTER — Other Ambulatory Visit: Payer: Self-pay | Admitting: Cardiovascular Disease

## 2015-11-08 ENCOUNTER — Encounter (HOSPITAL_COMMUNITY): Payer: Medicare Other | Attending: Hematology and Oncology

## 2015-11-08 DIAGNOSIS — D45 Polycythemia vera: Secondary | ICD-10-CM | POA: Insufficient documentation

## 2015-11-08 LAB — CBC WITH DIFFERENTIAL/PLATELET
Basophils Absolute: 0.4 10*3/uL — ABNORMAL HIGH (ref 0.0–0.1)
Basophils Relative: 2 %
EOS PCT: 1 %
Eosinophils Absolute: 0.3 10*3/uL (ref 0.0–0.7)
HCT: 42.7 % (ref 36.0–46.0)
HEMOGLOBIN: 12.5 g/dL (ref 12.0–15.0)
LYMPHS ABS: 0.9 10*3/uL (ref 0.7–4.0)
LYMPHS PCT: 4 %
MCH: 24 pg — AB (ref 26.0–34.0)
MCHC: 29.3 g/dL — AB (ref 30.0–36.0)
MCV: 82 fL (ref 78.0–100.0)
MONOS PCT: 3 %
Monocytes Absolute: 0.6 10*3/uL (ref 0.1–1.0)
NEUTROS PCT: 90 %
Neutro Abs: 18.3 10*3/uL — ABNORMAL HIGH (ref 1.7–7.7)
Platelets: 491 10*3/uL — ABNORMAL HIGH (ref 150–400)
RBC: 5.21 MIL/uL — AB (ref 3.87–5.11)
RDW: 22.3 % — ABNORMAL HIGH (ref 11.5–15.5)
WBC: 20.4 10*3/uL — ABNORMAL HIGH (ref 4.0–10.5)

## 2015-11-14 ENCOUNTER — Ambulatory Visit (INDEPENDENT_AMBULATORY_CARE_PROVIDER_SITE_OTHER): Payer: Medicare Other | Admitting: *Deleted

## 2015-11-14 DIAGNOSIS — I482 Chronic atrial fibrillation, unspecified: Secondary | ICD-10-CM

## 2015-11-14 DIAGNOSIS — Z5181 Encounter for therapeutic drug level monitoring: Secondary | ICD-10-CM

## 2015-11-14 LAB — POCT INR: INR: 2

## 2015-11-19 DIAGNOSIS — Z1389 Encounter for screening for other disorder: Secondary | ICD-10-CM | POA: Diagnosis not present

## 2015-11-19 DIAGNOSIS — Z6824 Body mass index (BMI) 24.0-24.9, adult: Secondary | ICD-10-CM | POA: Diagnosis not present

## 2015-11-19 DIAGNOSIS — R600 Localized edema: Secondary | ICD-10-CM | POA: Diagnosis not present

## 2015-11-19 DIAGNOSIS — R0602 Shortness of breath: Secondary | ICD-10-CM | POA: Diagnosis not present

## 2015-11-21 ENCOUNTER — Other Ambulatory Visit (HOSPITAL_COMMUNITY)
Admission: RE | Admit: 2015-11-21 | Discharge: 2015-11-21 | Disposition: A | Discharge status: Home / Self Care | Payer: Medicare Other | Source: Ambulatory Visit | Attending: Physician Assistant | Admitting: Physician Assistant

## 2015-11-21 ENCOUNTER — Encounter: Payer: Self-pay | Admitting: Physician Assistant

## 2015-11-21 ENCOUNTER — Ambulatory Visit (INDEPENDENT_AMBULATORY_CARE_PROVIDER_SITE_OTHER): Payer: Medicare Other | Admitting: Physician Assistant

## 2015-11-21 VITALS — BP 114/68 | HR 75 | Ht 72.0 in | Wt 180.0 lb

## 2015-11-21 DIAGNOSIS — I482 Chronic atrial fibrillation, unspecified: Secondary | ICD-10-CM

## 2015-11-21 DIAGNOSIS — I10 Essential (primary) hypertension: Secondary | ICD-10-CM

## 2015-11-21 DIAGNOSIS — I5031 Acute diastolic (congestive) heart failure: Secondary | ICD-10-CM | POA: Diagnosis not present

## 2015-11-21 LAB — BASIC METABOLIC PANEL
ANION GAP: 9 (ref 5–15)
BUN: 18 mg/dL (ref 6–20)
CALCIUM: 9 mg/dL (ref 8.9–10.3)
CO2: 27 mmol/L (ref 22–32)
Chloride: 105 mmol/L (ref 101–111)
Creatinine, Ser: 0.98 mg/dL (ref 0.44–1.00)
GFR, EST NON AFRICAN AMERICAN: 56 mL/min — AB (ref >60)
GLUCOSE: 87 mg/dL (ref 65–99)
POTASSIUM: 4 mmol/L (ref 3.5–5.1)
SODIUM: 141 mmol/L (ref 135–145)

## 2015-11-21 LAB — BRAIN NATRIURETIC PEPTIDE: B NATRIURETIC PEPTIDE 5: 815 pg/mL — AB (ref 0.0–100.0)

## 2015-11-21 NOTE — Progress Notes (Signed)
Cardiology Office Note    Date:  11/21/2015   ID:  Charlotte [name], DOB 1940/10/03, MRN [id number]  PCP:  [name], MD  Cardiologist: Dr. Bronson Ing   Chief complaint: Shortness of breath and edema  History of Present Illness:  Charlotte [name] is a 75 y.o. female with history of chronic atrial fibrillation on Coumadin, chronic diastolic heart failure, polycythemia vera, hypertension, PVD status post right femoropopliteal bypass grafting and endarterectomy. EF 55-60% on echo 11/22/14.  Patient comes in today on referral from her primary care with 2 week history of worsening dyspnea on exertion, edema and weight gain. She eats out a lot at the steak house, fish house and [place]. At home she tries to avoid salt. She denies chest pain, rapid heartbeats. She is on oxygen for COPD but did not bring it in with her today because she doesn't like to carry the heavy tank.    Past Medical History  Diagnosis Date  . Hypertension   . Arthritis   . Varicose veins   . Deaf   . Elevated WBC count     and platelets  . Thyroid disease   . Irregular heart rate   . 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 Peripheral neuropathy (HCC)     feet  . 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 Atrial fibrillation, chronic ([place])   . 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 Peripheral vascular disease ([place])   . Ulcer of ankle ([place]) 05/2014    left and right ankle  . Heart failure ([place])   . Pulmonary edema   . DVT (deep venous thrombosis) [facility])     Past Surgical History  Procedure Laterality Date  . Blt    . Abdominal hysterectomy    . Coronary angioplasty    . Ct of abd    . Hip pinning,cannulated  11/19/2011    Procedure: CANNULATED HIP PINNING;  Surgeon: Sanjuana Kava, MD;  Location[facility];  Service: Orthopedics;  Laterality: Right;  . Fasciotomy  11/29/2011    Procedure: FASCIOTOMY;  Surgeon: Carole Civil, MD;  Location: [facility];  Service: Orthopedics;  Laterality:  Right;  right thigh   . Dressing change under anesthesia  12/02/2011    Procedure: DRESSING CHANGE UNDER ANESTHESIA;  Surgeon: Carole Civil, MD;  Location[facility];  Service: Orthopedics;  Laterality: Right;  . Endarterectomy femoral Right 09/15/2012    Procedure: ENDARTERECTOMY FEMORAL;  Surgeon: Mal Misty, MD;  Location: [place];  Service: Vascular;  Laterality: Right;  . Femoral-popliteal bypass graft Right 09/15/2012    Procedure: BYPASS GRAFT FEMORAL-POPLITEAL ARTERY;  Surgeon: Mal Misty, MD;  Location: [place];  Service: Vascular;  Laterality: Right;  . Patch angioplasty Right 09/15/2012    Procedure: PATCH ANGIOPLASTY;  Surgeon: Mal Misty, MD;  Location: [place];  Service: Vascular;  Laterality: Right;  . Abdominal aortagram N/A 09/14/2012    Procedure: ABDOMINAL [name];  Surgeon: Serafina Mitchell, MD;  Location: [facility] CATH LAB;  Service: Cardiovascular;  Laterality: N/A;    Current Medications: Outpatient Prescriptions Prior to Visit  Medication Sig Dispense Refill  . aspirin EC 81 MG tablet Take 81 mg by mouth daily.    Marland Kitchen CARTIA XT 240 MG 24 hr capsule Take 240 mg by mouth daily.     . Cholecalciferol (VITAMIN D3) 5000 UNITS CAPS Take 1 capsule by mouth daily.    Marland Kitchen gabapentin (NEURONTIN) 100 MG capsule Take 100 mg by mouth  3 (three) times daily.      . hydroxyurea (HYDREA) 500 MG capsule Take 500 mg PO BID Monday- Friday.  May take with food to minimize GI side effects. (Patient taking differently: Take 500 mg by mouth 2 (two) times daily. Monday- Friday ONLY.  May take with food to minimize GI side effects.) 40 capsule 5  . levothyroxine (SYNTHROID, LEVOTHROID) 75 MCG tablet Take 75 mcg by mouth daily before breakfast.     . metoprolol (LOPRESSOR) 100 MG tablet Take 100 mg by mouth 2 (two) times daily.    . Potassium Chloride ER 20 MEQ TBCR Take 20 mEq by mouth daily. 30 tablet 1  . torsemide (DEMADEX) 20 MG tablet Take 1 tablet (20 mg total) by mouth daily. 60 tablet 1  .  warfarin (COUMADIN) 5 MG tablet TAKE AS DIRECTED BY  COUMADIN  CLINIC 30 tablet 0  . oseltamivir (TAMIFLU) 75 MG capsule Take 1 capsule (75 mg total) by mouth 2 (two) times daily. 6 capsule 0   No facility-administered medications prior to visit.     Allergies:   Review of patient's allergies indicates no known allergies.   Social History   Social History  . Marital Status: Married    Spouse Name: N/A  . Number of Children: N/A  . Years of Education: N/A   Social History Main Topics  . Smoking status: Never Smoker   . Smokeless tobacco: Never Used  . Alcohol Use: No  . Drug Use: No  . Sexual Activity: No   Other Topics Concern  . None   Social History Narrative     Family History:  The patient's    family history includes Diabetes in her son; Heart failure in her mother; Hypertension in her sister and sister; Stroke in her father and sister.   ROS:   Please see the history of present illness.    Review of Systems  Constitution: Positive for malaise/fatigue and weight gain.  HENT: Positive for hearing loss.   Cardiovascular: Positive for claudication, dyspnea on exertion, irregular heartbeat, leg swelling and orthopnea.  Respiratory: Positive for shortness of breath and sleep disturbances due to breathing.   Endocrine: Positive for cold intolerance.  Musculoskeletal: Positive for muscle weakness.  Gastrointestinal: Negative.   Neurological: Negative.    All other systems reviewed and are negative.   PHYSICAL EXAM:   VS:  BP 114/68 mmHg  Pulse 75  Ht 6' (1.829 m)  Wt 180 lb (81.647 kg)  BMI 24.41 kg/m2  SpO2 87%   GEN: Well nourished, well developed, in no acute distress Neck: Increased JVD, no carotid bruits, or masses Cardiac: Irregular irregular; no murmurs, rubs, or gallops,no edema  Respiratory: Decreased breath sounds throughout but clear to auscultation bilaterally GI: soft, nontender, nondistended, + BS Skin: warm and dry, no rash Neuro:  Alert and  Oriented x 3, Strength and sensation are intact   Wt Readings from Last 3 Encounters:  11/21/15 180 lb (81.647 kg)  09/06/15 179 lb (81.194 kg)  08/07/15 174 lb (78.926 kg)      Studies/Labs Reviewed:   EKG:  EKG isIs not ordered today.    Recent Labs: 11/22/2014: B Natriuretic Peptide 456.0*; TSH 1.984 12/21/2014: ALT 10* 08/10/2015: BUN 19; Creatinine, Ser 0.82; Potassium 3.6; Sodium 142 11/08/2015: Hemoglobin 12.5; Platelets 491*   Lipid Panel    Component Value Date/Time   CHOL  07/19/2009 0448    119        ATP III CLASSIFICATION:  <  200     mg/dL   Desirable  200-239  mg/dL   Borderline High  >=240    mg/dL   High          TRIG 124 07/19/2009 0448   HDL 32* 07/19/2009 0448   CHOLHDL 3.7 07/19/2009 0448   VLDL 25 07/19/2009 0448   LDLCALC  07/19/2009 0448    62        Total Cholesterol/HDL:CHD Risk Coronary Heart Disease Risk Table                     Men   Women  1/2 Average Risk   3.4   3.3  Average Risk       5.0   4.4  2 X Average Risk   9.6   7.1  3 X Average Risk  23.4   11.0        Use the calculated Patient Ratio above and the CHD Risk Table to determine the patient's CHD Risk.        ATP III CLASSIFICATION (LDL):  <100     mg/dL   Optimal  100-129  mg/dL   Near or Above                    Optimal  130-159  mg/dL   Borderline  160-189  mg/dL   High  >190     mg/dL   Very High    Additional studies/ records that were reviewed today include:  2-D echo 4/2016Echocardiogram 11/22/2014 Left ventricle: The cavity size was normal. Wall thickness was   normal. Systolic function was normal. The estimated ejection   fraction was in the range of 55% to 60%. Wall motion was normal;   there were no regional wall motion abnormalities. - Aortic valve: Mildly calcified annulus. Trileaflet; mildly   thickened leaflets. Valve area (VTI): 2.15 cm^2. Valve area   (Vmax): 2.35 cm^2. - Mitral valve: Mildly calcified annulus. Mildly thickened leaflets   . There was  mild regurgitation. - Left atrium: The atrium was severely dilated. - Right ventricle: The cavity size was mildly dilated. - Right atrium: The atrium was severely dilated. - Atrial septum: No defect or patent foramen ovale was identified. - Pulmonary arteries: Systolic pressure was moderately increased.   PA peak pressure: 50 mm Hg (S). - Inferior vena cava: The vessel was dilated. The respirophasic   diameter changes were blunted (< 50%), consistent with elevated   central venous pressure. - Pericardium, extracardiac: There is a large left pleural   effusion. - Technically adequate study.      ASSESSMENT:    1. Acute diastolic heart failure ([place])   2. Chronic atrial fibrillation (HCC)   3. Essential hypertension      PLAN:  In order of problems listed above:  Diastolic heart failure I suspect is secondary to dietary indiscretion. She has over 5 pound weight gain and increased edema. Recommend increasing Demadex to 40 mg daily for 5 days increase potassium to 40 mEq daily for 5 days then back to her 20 mEq daily and Demadex 20 mg daily. Check be met and BNP today. Follow-up with me in 2 weeks. 2 g sodium diet given and discussed with her in detail.  Patient has chronic atrial fibrillation and is on Coumadin. Rate is controlled today  Hypertension controlled   Medication Adjustments/Labs and Tests Ordered: Current medicines are reviewed at length with the patient today.  Concerns regarding medicines are outlined  above.  Medication changes, Labs and Tests ordered today are listed in the Patient Instructions below. There are no Patient Instructions on file for this visit.   Signed, Ermalinda Barrios, PA-C  11/21/2015 2:25 PM    [facility] [place], [place], [place]  [address] Phone: [phone]; Fax: [phone]

## 2015-11-21 NOTE — Patient Instructions (Signed)
Your physician recommends that you schedule a follow-up appointment in: 2 weeks with Ermalinda Barrios, PA-C  Your physician recommends that you have lab work done today.   Your physician has recommended you make the following change in your medication:   Increase Demadex (Torsemide) to 2 Tablet ( 40 mg) Daily for the next 5 days and then return to taking 20 mg Daily.   Increase Potassium to 40 mEq (2 Tablets) Daily for the next 5 days and then return to taking 20 mEq Daily.   If you need a refill on your cardiac medications before your next appointment, please call your pharmacy.  Thank you for choosing [facility]!

## 2015-11-28 DIAGNOSIS — J069 Acute upper respiratory infection, unspecified: Secondary | ICD-10-CM | POA: Diagnosis not present

## 2015-11-28 DIAGNOSIS — R070 Pain in throat: Secondary | ICD-10-CM | POA: Diagnosis not present

## 2015-11-28 DIAGNOSIS — J209 Acute bronchitis, unspecified: Secondary | ICD-10-CM | POA: Diagnosis not present

## 2015-11-28 DIAGNOSIS — Z6824 Body mass index (BMI) 24.0-24.9, adult: Secondary | ICD-10-CM | POA: Diagnosis not present

## 2015-11-28 DIAGNOSIS — R05 Cough: Secondary | ICD-10-CM | POA: Diagnosis not present

## 2015-11-28 DIAGNOSIS — J343 Hypertrophy of nasal turbinates: Secondary | ICD-10-CM | POA: Diagnosis not present

## 2015-11-28 DIAGNOSIS — R0602 Shortness of breath: Secondary | ICD-10-CM | POA: Diagnosis not present

## 2015-11-28 DIAGNOSIS — J302 Other seasonal allergic rhinitis: Secondary | ICD-10-CM | POA: Diagnosis not present

## 2015-11-29 ENCOUNTER — Encounter (HOSPITAL_COMMUNITY): Payer: Self-pay | Admitting: Hematology & Oncology

## 2015-11-29 ENCOUNTER — Encounter (HOSPITAL_COMMUNITY): Payer: Self-pay | Admitting: Emergency Medicine

## 2015-11-29 ENCOUNTER — Other Ambulatory Visit: Payer: Self-pay

## 2015-11-29 ENCOUNTER — Inpatient Hospital Stay (HOSPITAL_COMMUNITY)
Admission: EM | Admit: 2015-11-29 | Discharge: 2015-12-03 | DRG: 291 | Disposition: A | Discharge status: Home / Self Care | Payer: Medicare Other | Attending: Internal Medicine | Admitting: Internal Medicine

## 2015-11-29 ENCOUNTER — Emergency Department (HOSPITAL_COMMUNITY): Payer: Medicare Other

## 2015-11-29 ENCOUNTER — Encounter (HOSPITAL_COMMUNITY): Payer: Medicare Other

## 2015-11-29 ENCOUNTER — Encounter (HOSPITAL_BASED_OUTPATIENT_CLINIC_OR_DEPARTMENT_OTHER): Payer: Medicare Other | Admitting: Hematology & Oncology

## 2015-11-29 ENCOUNTER — Other Ambulatory Visit (HOSPITAL_COMMUNITY): Payer: Self-pay

## 2015-11-29 VITALS — BP 128/70 | HR 102 | Temp 98.9°F | Resp 22 | Wt 175.3 lb

## 2015-11-29 DIAGNOSIS — I5030 Unspecified diastolic (congestive) heart failure: Secondary | ICD-10-CM

## 2015-11-29 DIAGNOSIS — Z9861 Coronary angioplasty status: Secondary | ICD-10-CM | POA: Diagnosis not present

## 2015-11-29 DIAGNOSIS — R05 Cough: Secondary | ICD-10-CM

## 2015-11-29 DIAGNOSIS — Z823 Family history of stroke: Secondary | ICD-10-CM | POA: Diagnosis not present

## 2015-11-29 DIAGNOSIS — D45 Polycythemia vera: Secondary | ICD-10-CM | POA: Diagnosis present

## 2015-11-29 DIAGNOSIS — Z9981 Dependence on supplemental oxygen: Secondary | ICD-10-CM | POA: Diagnosis not present

## 2015-11-29 DIAGNOSIS — I272 Other secondary pulmonary hypertension: Secondary | ICD-10-CM | POA: Diagnosis present

## 2015-11-29 DIAGNOSIS — I739 Peripheral vascular disease, unspecified: Secondary | ICD-10-CM | POA: Diagnosis present

## 2015-11-29 DIAGNOSIS — I482 Chronic atrial fibrillation, unspecified: Secondary | ICD-10-CM | POA: Diagnosis present

## 2015-11-29 DIAGNOSIS — I5033 Acute on chronic diastolic (congestive) heart failure: Secondary | ICD-10-CM | POA: Diagnosis not present

## 2015-11-29 DIAGNOSIS — Z8249 Family history of ischemic heart disease and other diseases of the circulatory system: Secondary | ICD-10-CM | POA: Diagnosis not present

## 2015-11-29 DIAGNOSIS — J9621 Acute and chronic respiratory failure with hypoxia: Secondary | ICD-10-CM | POA: Diagnosis not present

## 2015-11-29 DIAGNOSIS — I110 Hypertensive heart disease with heart failure: Secondary | ICD-10-CM | POA: Diagnosis not present

## 2015-11-29 DIAGNOSIS — R0902 Hypoxemia: Secondary | ICD-10-CM

## 2015-11-29 DIAGNOSIS — I509 Heart failure, unspecified: Secondary | ICD-10-CM | POA: Diagnosis not present

## 2015-11-29 DIAGNOSIS — Z79899 Other long term (current) drug therapy: Secondary | ICD-10-CM | POA: Diagnosis not present

## 2015-11-29 DIAGNOSIS — Z86718 Personal history of other venous thrombosis and embolism: Secondary | ICD-10-CM | POA: Diagnosis not present

## 2015-11-29 DIAGNOSIS — Z7901 Long term (current) use of anticoagulants: Secondary | ICD-10-CM

## 2015-11-29 DIAGNOSIS — Z833 Family history of diabetes mellitus: Secondary | ICD-10-CM | POA: Diagnosis not present

## 2015-11-29 DIAGNOSIS — Z9111 Patient's noncompliance with dietary regimen: Secondary | ICD-10-CM | POA: Diagnosis not present

## 2015-11-29 DIAGNOSIS — E876 Hypokalemia: Secondary | ICD-10-CM | POA: Diagnosis present

## 2015-11-29 DIAGNOSIS — J189 Pneumonia, unspecified organism: Secondary | ICD-10-CM | POA: Diagnosis not present

## 2015-11-29 DIAGNOSIS — R0602 Shortness of breath: Secondary | ICD-10-CM | POA: Diagnosis not present

## 2015-11-29 DIAGNOSIS — I5031 Acute diastolic (congestive) heart failure: Secondary | ICD-10-CM | POA: Diagnosis not present

## 2015-11-29 DIAGNOSIS — H9190 Unspecified hearing loss, unspecified ear: Secondary | ICD-10-CM | POA: Diagnosis present

## 2015-11-29 DIAGNOSIS — E039 Hypothyroidism, unspecified: Secondary | ICD-10-CM | POA: Diagnosis present

## 2015-11-29 DIAGNOSIS — I10 Essential (primary) hypertension: Secondary | ICD-10-CM | POA: Diagnosis not present

## 2015-11-29 DIAGNOSIS — R0600 Dyspnea, unspecified: Secondary | ICD-10-CM

## 2015-11-29 DIAGNOSIS — R059 Cough, unspecified: Secondary | ICD-10-CM | POA: Diagnosis present

## 2015-11-29 HISTORY — DX: Essential (primary) hypertension: I10

## 2015-11-29 HISTORY — DX: Hypothyroidism, unspecified: E03.9

## 2015-11-29 LAB — CBC
HCT: 44.4 % (ref 36.0–46.0)
Hemoglobin: 13 g/dL (ref 12.0–15.0)
MCH: 23.9 pg — ABNORMAL LOW (ref 26.0–34.0)
MCHC: 29.3 g/dL — ABNORMAL LOW (ref 30.0–36.0)
MCV: 81.6 fL (ref 78.0–100.0)
Platelets: 270 10*3/uL (ref 150–400)
RBC: 5.44 MIL/uL — ABNORMAL HIGH (ref 3.87–5.11)
RDW: 22.8 % — ABNORMAL HIGH (ref 11.5–15.5)
WBC: 12.1 10*3/uL — ABNORMAL HIGH (ref 4.0–10.5)

## 2015-11-29 LAB — CBC WITH DIFFERENTIAL/PLATELET
BASOS ABS: 0.2 10*3/uL — AB (ref 0.0–0.1)
Basophils Relative: 1 %
EOS ABS: 0.1 10*3/uL (ref 0.0–0.7)
EOS PCT: 1 %
HCT: 44.3 % (ref 36.0–46.0)
Hemoglobin: 13.1 g/dL (ref 12.0–15.0)
LYMPHS ABS: 0.7 10*3/uL (ref 0.7–4.0)
Lymphocytes Relative: 5 %
MCH: 24.2 pg — AB (ref 26.0–34.0)
MCHC: 29.6 g/dL — ABNORMAL LOW (ref 30.0–36.0)
MCV: 81.7 fL (ref 78.0–100.0)
MONO ABS: 0.8 10*3/uL (ref 0.1–1.0)
Monocytes Relative: 6 %
Neutro Abs: 11 10*3/uL — ABNORMAL HIGH (ref 1.7–7.7)
Neutrophils Relative %: 86 %
PLATELETS: 280 10*3/uL (ref 150–400)
RBC: 5.42 MIL/uL — AB (ref 3.87–5.11)
RDW: 22.7 % — AB (ref 11.5–15.5)
WBC: 12.8 10*3/uL — AB (ref 4.0–10.5)

## 2015-11-29 LAB — TROPONIN I: Troponin I: <0.03 ng/mL (ref <0.031)

## 2015-11-29 LAB — COMPREHENSIVE METABOLIC PANEL
ALT: 15 U/L (ref 14–54)
AST: 20 U/L (ref 15–41)
Albumin: 3.4 g/dL — ABNORMAL LOW (ref 3.5–5.0)
Alkaline Phosphatase: 66 U/L (ref 38–126)
Anion gap: 10 (ref 5–15)
BUN: 21 mg/dL — ABNORMAL HIGH (ref 6–20)
CO2: 27 mmol/L (ref 22–32)
Calcium: 8.5 mg/dL — ABNORMAL LOW (ref 8.9–10.3)
Chloride: 101 mmol/L (ref 101–111)
Creatinine, Ser: 0.97 mg/dL (ref 0.44–1.00)
GFR calc Af Amer: >60 mL/min (ref >60)
GFR calc non Af Amer: 57 mL/min — ABNORMAL LOW (ref >60)
Glucose, Bld: 91 mg/dL (ref 65–99)
Potassium: 3.5 mmol/L (ref 3.5–5.1)
Sodium: 138 mmol/L (ref 135–145)
Total Bilirubin: 1.9 mg/dL — ABNORMAL HIGH (ref 0.3–1.2)
Total Protein: 6.5 g/dL (ref 6.5–8.1)

## 2015-11-29 LAB — PROTIME-INR
INR: 3.03 — ABNORMAL HIGH (ref 0.00–1.49)
Prothrombin Time: 30.9 seconds — ABNORMAL HIGH (ref 11.6–15.2)

## 2015-11-29 LAB — BRAIN NATRIURETIC PEPTIDE: B Natriuretic Peptide: 816 pg/mL — ABNORMAL HIGH (ref 0.0–100.0)

## 2015-11-29 MED ORDER — POTASSIUM CHLORIDE CRYS ER 10 MEQ PO TBCR: 20.0000 meq | EXTENDED_RELEASE_TABLET | Freq: Every day | ORAL | Status: DC

## 2015-11-29 MED ORDER — CEFTRIAXONE SODIUM 1 G IJ SOLR
1.0000 g | Freq: Once | INTRAMUSCULAR | Status: AC
  Administered 2015-11-29: 1 g via INTRAVENOUS
  Filled 2015-11-29: qty 10

## 2015-11-29 MED ORDER — DEXTROSE 5 % IV SOLN
500.0000 mg | INTRAVENOUS | Status: DC
  Administered 2015-11-29: 500 mg via INTRAVENOUS
  Filled 2015-11-29: qty 500

## 2015-11-29 MED ORDER — ASPIRIN EC 81 MG PO TBEC
81.0000 mg | DELAYED_RELEASE_TABLET | Freq: Every day | ORAL | Status: DC
  Administered 2015-11-30 – 2015-12-03 (×4): 81 mg via ORAL
  Filled 2015-11-29 (×4): qty 1

## 2015-11-29 MED ORDER — ENSURE ENLIVE PO LIQD
237.0000 mL | Freq: Two times a day (BID) | ORAL | Status: DC
  Administered 2015-12-01 (×2): 237 mL via ORAL

## 2015-11-29 MED ORDER — ONDANSETRON HCL 4 MG/2ML IJ SOLN: 4.0000 mg | Freq: Three times a day (TID) | INTRAMUSCULAR | Status: DC | PRN

## 2015-11-29 MED ORDER — IPRATROPIUM-ALBUTEROL 0.5-2.5 (3) MG/3ML IN SOLN
3.0000 mL | Freq: Once | RESPIRATORY_TRACT | Status: AC
  Administered 2015-11-29: 3 mL via RESPIRATORY_TRACT
  Filled 2015-11-29: qty 3

## 2015-11-29 MED ORDER — FUROSEMIDE 10 MG/ML IJ SOLN
60.0000 mg | Freq: Two times a day (BID) | INTRAMUSCULAR | Status: DC
  Administered 2015-11-29: 60 mg via INTRAVENOUS
  Filled 2015-11-29 (×2): qty 6

## 2015-11-29 MED ORDER — LEVOTHYROXINE SODIUM 75 MCG PO TABS
75.0000 ug | ORAL_TABLET | Freq: Every day | ORAL | Status: DC
  Administered 2015-11-30 – 2015-12-03 (×4): 75 ug via ORAL
  Filled 2015-11-29 (×4): qty 1

## 2015-11-29 MED ORDER — DEXTROSE 5 % IV SOLN
1.0000 g | INTRAVENOUS | Status: DC
  Administered 2015-11-30 – 2015-12-02 (×3): 1 g via INTRAVENOUS
  Filled 2015-11-29 (×4): qty 10

## 2015-11-29 MED ORDER — CETYLPYRIDINIUM CHLORIDE 0.05 % MT LIQD
7.0000 mL | Freq: Two times a day (BID) | OROMUCOSAL | Status: DC
  Administered 2015-11-29 – 2015-12-03 (×8): 7 mL via OROMUCOSAL

## 2015-11-29 MED ORDER — GABAPENTIN 100 MG PO CAPS
100.0000 mg | ORAL_CAPSULE | Freq: Three times a day (TID) | ORAL | Status: DC
  Administered 2015-11-29 – 2015-12-03 (×11): 100 mg via ORAL
  Filled 2015-11-29 (×11): qty 1

## 2015-11-29 MED ORDER — SODIUM CHLORIDE 0.9 % IV SOLN: 250.0000 mL | INTRAVENOUS | Status: DC | PRN

## 2015-11-29 MED ORDER — LISINOPRIL 10 MG PO TABS
10.0000 mg | ORAL_TABLET | Freq: Every day | ORAL | Status: DC
  Administered 2015-11-30 – 2015-12-03 (×4): 10 mg via ORAL
  Filled 2015-11-29 (×4): qty 1

## 2015-11-29 MED ORDER — ALBUTEROL SULFATE (2.5 MG/3ML) 0.083% IN NEBU: 2.5000 mg | INHALATION_SOLUTION | Freq: Four times a day (QID) | RESPIRATORY_TRACT | Status: AC | PRN

## 2015-11-29 MED ORDER — ACETAMINOPHEN 325 MG PO TABS: 650.0000 mg | ORAL_TABLET | ORAL | Status: DC | PRN

## 2015-11-29 MED ORDER — SODIUM CHLORIDE 0.9% FLUSH: 3.0000 mL | INTRAVENOUS | Status: DC | PRN

## 2015-11-29 MED ORDER — ONDANSETRON HCL 4 MG/2ML IJ SOLN: 4.0000 mg | Freq: Four times a day (QID) | INTRAMUSCULAR | Status: DC | PRN

## 2015-11-29 MED ORDER — DILTIAZEM HCL 25 MG/5ML IV SOLN
10.0000 mg | Freq: Once | INTRAVENOUS | Status: AC
  Administered 2015-11-29: 10 mg via INTRAVENOUS
  Filled 2015-11-29: qty 5

## 2015-11-29 MED ORDER — DEXTROSE 5 % IV SOLN
250.0000 mg | INTRAVENOUS | Status: DC
  Administered 2015-11-30 – 2015-12-01 (×2): 250 mg via INTRAVENOUS
  Filled 2015-11-29 (×3): qty 250

## 2015-11-29 MED ORDER — DILTIAZEM HCL ER COATED BEADS 120 MG PO CP24
120.0000 mg | ORAL_CAPSULE | Freq: Every day | ORAL | Status: DC
  Administered 2015-11-29 – 2015-12-03 (×5): 120 mg via ORAL
  Filled 2015-11-29 (×5): qty 1

## 2015-11-29 MED ORDER — FUROSEMIDE 10 MG/ML IJ SOLN
60.0000 mg | Freq: Once | INTRAMUSCULAR | Status: AC
  Administered 2015-11-29: 60 mg via INTRAVENOUS
  Filled 2015-11-29: qty 6

## 2015-11-29 MED ORDER — HYDROXYUREA 500 MG PO CAPS
500.0000 mg | ORAL_CAPSULE | Freq: Two times a day (BID) | ORAL | Status: DC
  Administered 2015-11-30 – 2015-12-03 (×7): 500 mg via ORAL
  Filled 2015-11-29 (×7): qty 1

## 2015-11-29 MED ORDER — METOPROLOL TARTRATE 50 MG PO TABS
100.0000 mg | ORAL_TABLET | Freq: Two times a day (BID) | ORAL | Status: DC
Start: 2015-11-29 — End: 2015-12-03
  Administered 2015-11-29 – 2015-12-03 (×8): 100 mg via ORAL
  Filled 2015-11-29 (×8): qty 2

## 2015-11-29 MED ORDER — SODIUM CHLORIDE 0.9% FLUSH
3.0000 mL | Freq: Two times a day (BID) | INTRAVENOUS | Status: DC
  Administered 2015-11-29 – 2015-12-03 (×6): 3 mL via INTRAVENOUS

## 2015-11-29 NOTE — H&P (Signed)
[facility] History and Physical  Charlotte [name] [id number] DOB: 14-Mar-1941 DOA: 11/29/2015  Referring physician: Dr Wilson Singer PCP: [name], MD   Chief Complaint: SOB  HPI: Charlotte [name] is a 75 y.o. female with history of DVT, PVD, ankle ulcer, chron afib, polycythemia vera, neuropathy, deafness, DJD and HTN. Hx of diast CHF and PNA this January, treated here at [facility].  Patient was seen by Hem-Onc today in clinic for f/u of PV, on Hydrea.  Pt noted worsening SOB, and low O2 levels at home despite home O2.  Sent to ED for evaluation.  In ED SaO2's are high 80's to low 90's on 6L [place] O2.  Pt reports SOB for 2-3 days.  She has had chills and some prod cough, was seen by PCP yesterday and received " a shot" and Rx for augmentin.  SOB worse today.  No chest pain, no abd pain, n/v/d.  No voiding problems.  Takes torsemide at home.  We are asked to see for hypoxemia.    Patient has hx of polycythemia vera x 14 yrs, on chemoRx per Dr Whitney Muse.  Her son was in [facility] this week for PNA and DM.  She was cold visiting him at the hospital this week and had to keep a coat on.  Then the last two days she had a bad cough, w some yellow/ grayish colored sputum.  Chills are better today after ABx in PCP's office yetserday.  She notes her abdomen is more swollen just today.  Her legs were swollen but not now.  In jan she says she was here with PNA and then developed CHF also.    Followed in [place] for heart problems, afib.  No hx of any MI or stents.  Mother died of CAD.    Worked at [employer] center in [place] for 3 yrs, also worked at a [employer] which is now closed.  Never smoked or drank, but husband was a "chain smoker" for many years.  She had R pelvic bone fx in [id number] complicated by "pressure in my thigh" a week later for which they had to go in and open the R thigh.  Then in 2014 she had "blood clot" in her R leg and Dr Kellie Simmering did surgery/created a bypass in the R leg.  Has had  afib for about 4 yrs per pt.      Chart review: 2010 - abd pain , poss splenic infarctions due to PV and/or afib.  Sub Rx INR. HTN. Mild LVH on echo. HTN rx's with dilt / MTP.  PV rx'd with hydrea.  2013 - R thigh compartment syndrome (after recent R pelvic fx) - rx 'd with fasciotomy here at [facility].  Intraop findings were a torn vastus intermedius muscle with a large hematoma consisting of clotted blood.  Some nonviable muscle was resected, the contractile portion was left in place.  She had "fluid in her lungs" rx'd with lasix.   2014 - R fem endarterectomy w R fem-pop BPG and patch angioplasty.  Came in with R leg pain, prior R pelvis fracture and compt syndrome as above. Hx of DVT and afib.  Also PV>  Apr 2016- acute resp failure w hypoxmiea/ diast HF acute.  Rx with IV lasix. Improved. dc'd on home O2. Jan 2017 - acute / chron resp failure, influenza A infection. Rx with abx first and IVF's.   Diuretics resumed upon dc.    ROS  denies CP  no joint pain  no HA  no blurry vision  no rash  no diarrhea  no nausea/ vomiting  no dysuria  no difficulty voiding  no change in urine color    Where does patient live home w husband Can patient participate in ADLs? yes  Past Medical History  Past Medical History  Diagnosis Date  . Hypertension   . Arthritis   . Varicose veins   . Deaf   . Elevated WBC count     and platelets  . Thyroid disease   . Irregular heart rate   . 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 Peripheral neuropathy (HCC)     feet  . 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 Atrial fibrillation, chronic ([place])   . 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 Peripheral vascular disease ([place])   . Ulcer of ankle ([place]) 05/2014    left and right ankle  . Heart failure ([place])   . Pulmonary edema   . DVT (deep venous thrombosis) [facility])    Past Surgical History  Past Surgical History  Procedure Laterality Date  . Blt    . Abdominal hysterectomy    . Coronary angioplasty     . Ct of abd    . Hip pinning,cannulated  11/19/2011    Procedure: CANNULATED HIP PINNING;  Surgeon: Sanjuana Kava, MD;  Location: [facility];  Service: Orthopedics;  Laterality: Right;  . Fasciotomy  11/29/2011    Procedure: FASCIOTOMY;  Surgeon: Carole Civil, MD;  Location: [facility];  Service: Orthopedics;  Laterality: Right;  right thigh   . Dressing change under anesthesia  12/02/2011    Procedure: DRESSING CHANGE UNDER ANESTHESIA;  Surgeon: Carole Civil, MD;  Location: [facility];  Service: Orthopedics;  Laterality: Right;  . Endarterectomy femoral Right 09/15/2012    Procedure: ENDARTERECTOMY FEMORAL;  Surgeon: Mal Misty, MD;  Location: [place];  Service: Vascular;  Laterality: Right;  . Femoral-popliteal bypass graft Right 09/15/2012    Procedure: BYPASS GRAFT FEMORAL-POPLITEAL ARTERY;  Surgeon: Mal Misty, MD;  Location: [place];  Service: Vascular;  Laterality: Right;  . Patch angioplasty Right 09/15/2012    Procedure: PATCH ANGIOPLASTY;  Surgeon: Mal Misty, MD;  Location: [place];  Service: Vascular;  Laterality: Right;  . Abdominal aortagram N/A 09/14/2012    Procedure: ABDOMINAL [name];  Surgeon: Serafina Mitchell, MD;  Location: [facility] CATH LAB;  Service: Cardiovascular;  Laterality: N/A;   Family History  Family History  Problem Relation Age of Onset  . Diabetes Son   . Heart failure Mother   . Stroke Father   . Hypertension Sister   . Hypertension Sister   . Stroke Sister    Social History  reports that she has never smoked. She has never used smokeless tobacco. She reports that she does not drink alcohol or use illicit drugs. Allergies No Known Allergies Home medications Prior to Admission medications   Medication Sig Start Date End Date Taking? Authorizing Provider  aspirin EC 81 MG tablet Take 81 mg by mouth daily.   Yes Historical Provider, MD  benzonatate (TESSALON) 100 MG capsule  11/28/15  Yes Historical Provider, MD  CARTIA XT 240 MG 24 hr capsule Take 240 mg  by mouth daily.  06/18/15  Yes Historical Provider, MD  Cholecalciferol (VITAMIN D3) 5000 UNITS CAPS Take 1 capsule by mouth daily.   Yes Historical Provider, MD  gabapentin (NEURONTIN) 100 MG capsule Take 100 mg by mouth 3 (three) times daily.  Yes Historical Provider, MD  hydroxyurea (HYDREA) 500 MG capsule Take 500 mg PO BID Monday- Friday.  May take with food to minimize GI side effects. Patient taking differently: Take 500 mg by mouth 2 (two) times daily. Monday- Friday ONLY.  May take with food to minimize GI side effects. 06/14/15  Yes Baird Cancer, PA-C  levothyroxine (SYNTHROID, LEVOTHROID) 75 MCG tablet Take 75 mcg by mouth daily before breakfast.  10/16/14  Yes Historical Provider, MD  metoprolol (LOPRESSOR) 100 MG tablet Take 100 mg by mouth 2 (two) times daily. 06/05/15  Yes Historical Provider, MD  Potassium Chloride ER 20 MEQ TBCR Take 20 mEq by mouth daily. 11/29/14  Yes Lezlie Octave Black, NP  torsemide (DEMADEX) 20 MG tablet Take 1 tablet (20 mg total) by mouth daily. 11/28/14  Yes Radene Gunning, NP  warfarin (COUMADIN) 5 MG tablet TAKE AS DIRECTED BY  COUMADIN  CLINIC 11/05/15  Yes Herminio Commons, MD  amoxicillin-clavulanate (AUGMENTIN) 875-125 MG tablet  11/28/15   Historical Provider, MD   Liver Function Tests  Recent Labs Lab 11/29/15 1349  AST 20  ALT 15  ALKPHOS 66  BILITOT 1.9*  PROT 6.5  ALBUMIN 3.4*   No results for input(s): LIPASE, AMYLASE in the last 168 hours. CBC  Recent Labs Lab 11/29/15 1206 11/29/15 1349  WBC 12.8* 12.1*  NEUTROABS 11.0*  --   HGB 13.1 13.0  HCT 44.3 44.4  MCV 81.7 81.6  PLT 280 [id number]   Basic Metabolic Panel  Recent Labs Lab 11/29/15 1349  NA 138  K 3.5  CL 101  CO2 27  GLUCOSE 91  BUN 21*  CREATININE 0.97  CALCIUM 8.5*     Filed Vitals:   11/29/15 1730 11/29/15 1800 11/29/15 1830 11/29/15 1900  BP: 139/86 121/78 123/81 124/74  Pulse: 93 88 96 89  Temp:      TempSrc:      Resp: 20 18 24 16   Height:       Weight:      SpO2: 88% 87% 87% 87%   Exam: VS: BP and HR stable, SaO2 is borderline 88-91% on 6L [place] Gen is not in distress, lips slightly cyanotic, chron ill appearing, pleasant No rash, cyanosis or gangrene Sclera anicteric, throat clear  No jvd or bruits Chest bibasilar rales/ rhonchi/ exp wheezing Cor irreg irreg no MRG Abd soft nt no mass or ascites, mildly distended GU deferred MS no joint effusions or deformity Ext 1+ bilat pretib/ pedal edema /chronic skin changes w erythema bilat LE's Neuro is alert, Ox 3 , nonfocal   EKG (independently reviewed) > afib 80's, PVC's, RAD, no acute changes CXR (independently reviewed) > bilat pleural effusions, small-moderate, was there on old films too. No pulm edema. All old CXR's from 2016-2017 are showing bilat effusions +/- pulm edema.  Before that in 2014 CXR's are normal  Last echo Apr 2016 > LV 55-60%, severe dilated LA/RA. RV mildly dilated. PA pressures mod increased 50 mm Hg. IVC respirophasic diameter changes were blunted c/w ^CVP. Large left pleural effusion.   Home medications: Cartia (dilt) XT 240/d, metoprolol 100 bid, KCl, torsemide 20 mg qd Coumadin as directed Synthroid, hydroxyurea, neurontin, vit D3, tessalon, ecasa  Na 138 K 3.5 BUN 21 Creat 0.97 Ca 8.5 Alb 3.5 LFT"s wnl. BNP 816 ^ Trop < 0.03 WBC 12k Hb 13 plt 270 INR 3.0   Assessment: 1. Cough / SOB / hypoxemia - in patient w known diastolic CHF.  No  fever/ WBC slightly up.  Some URI symptoms/ prod cough.  Suspect acute/ chron diast HF, can't r/o basilar PNA as well or bronchitis.  Will treat for both w IV lasix and IV abx for CAP.  Have decreased diltiazem (consider dc altogether), added low dose ace inhibitor, increase as needed but not while diuresing acutely. BP's normal now. Chronically ill lady with multiple medical problems. Doubt PE with INR 3.0 on coumadin.  2. Polycythemia vera - on hydrea 3. Chron afib on coumadin 4. HypoT4 5. Pulm HTN /diast  dysfunction - last echo 6. HTN on diltiazem/ BB   Plan - as above, inpatient admit/ telemetry   DVT Prophylaxis on coumadin  Code Status: full, d/w pt  Family Communication: none here  Disposition Plan: home when better    Sol Blazing [facility] Pager [phone]  Cell [phone]  If 7PM-7AM, please contact night-coverage [email or web address] Password [facility] 11/29/2015, 7:55 PM

## 2015-11-29 NOTE — ED Notes (Signed)
Pt voiding in bedpan frequently and overflowing into bed. Linen changed. Sats decrease to 80 % when moving in bed to be changed

## 2015-11-29 NOTE — ED Notes (Signed)
Pt sent from cancer center due to SOB and cyanosis of lips. Pt reports that she has been coughing since Monday. Cough is productive. Seen by PCP and given augmentin for URI. Reports difficulty breathing since yesterday. Oxygen level low yesterday and was told to wear home O2 continuosly. O2 increased to 6 L/via [place]. Pt is alert and oriented. Family at bedside

## 2015-11-29 NOTE — ED Notes (Signed)
Hospitalist at bedside. Aware of oxygen levels in upper 80's

## 2015-11-29 NOTE — Progress Notes (Signed)
[name], MD [place] [id number]    DIAGNOSIS: JAK 2 positive MPD, P. Vera   CURRENT THERAPY: Hydrea  INTERVAL HISTORY: Charlotte [name] 75 y.o. 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   She continues on hydrea. Today she presents with multiple complaints. [name] was here today with her husband and was in a wheelchair with an oxygen tank.  She is not feeling well today. She started feeling bad around Tuesday. She notes that she feels she cannot breathe. She was seen by her PCP yesterday and was given a "shot." She now has oral antibiotics. She notes she is getting worse and not better. She was told her to wear an oxygen tank all the time.   The inside of her mouth is raw and very dry. She said that this has been like this for several months. She said that nothing she eats or drinks helps and that it burns.   She went to go visit her son recently for 3 days; he is currently in the hospital. She said that it was freezing in his room the entire time she was there. She thinks maybe she has pneumonia.  She has no appetite and has not been eating anything.   She denies any fevers. She notes that she feels unable to go home. "I feel so bad."  MEDICAL HISTORY: Past Medical History  Diagnosis Date  . Hypertension   . Arthritis   . Varicose veins   . Deaf   . Elevated WBC count     and platelets  . Thyroid disease   . Irregular heart rate   . 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 Peripheral neuropathy (HCC)     feet  . 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 Atrial fibrillation, chronic ([place])   . 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 Peripheral vascular disease ([place])   . Ulcer of ankle ([place]) 05/2014    left and right ankle  . Heart failure ([place])   . Pulmonary edema   . 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     has No Known Allergies.  Current Outpatient Prescriptions on File Prior to Visit  Medication Sig Dispense Refill  . aspirin EC 81 MG tablet Take 81 mg by mouth daily.    Marland Kitchen CARTIA XT 240 MG 24 hr capsule Take 240 mg by mouth daily.     . Cholecalciferol (VITAMIN D3) 5000 UNITS CAPS Take 1 capsule by mouth daily.    Marland Kitchen gabapentin (NEURONTIN) 100 MG capsule Take 100 mg by mouth 3 (three) times daily.      . hydroxyurea (HYDREA) 500 MG capsule Take 500 mg PO BID Monday- Friday.  May take with food to minimize GI side effects. (Patient taking differently: Take 500 mg by mouth 2 (two) times daily. Monday- Friday ONLY.  May take with food to minimize GI side effects.) 40 capsule 5  . levothyroxine (SYNTHROID, LEVOTHROID) 75 MCG tablet Take 75 mcg by mouth daily before breakfast.     . metoprolol (LOPRESSOR) 100 MG tablet Take 100 mg by mouth 2 (two) times daily.    . Potassium Chloride ER 20 MEQ TBCR Take 20 mEq by mouth daily. 30 tablet 1  . torsemide (DEMADEX) 20 MG tablet Take 1 tablet (20 mg total) by mouth daily. 60 tablet 1  .  warfarin (COUMADIN) 5 MG tablet TAKE AS DIRECTED BY  COUMADIN  CLINIC 30 tablet 0   No current facility-administered medications on file prior to visit.     SURGICAL HISTORY: Past Surgical History  Procedure Laterality Date  . Blt    . Abdominal hysterectomy    . Coronary angioplasty    . Ct of abd    . Hip pinning,cannulated  11/19/2011    Procedure: CANNULATED HIP PINNING;  Surgeon: Sanjuana Kava, MD;  Location: [facility];  Service: Orthopedics;  Laterality: Right;  . Fasciotomy  11/29/2011    Procedure: FASCIOTOMY;  Surgeon: Carole Civil, MD;  Location: [facility];  Service: Orthopedics;  Laterality: Right;  right thigh   . Dressing change under anesthesia  12/02/2011    Procedure: DRESSING CHANGE UNDER ANESTHESIA;  Surgeon: Carole Civil, MD;  Location: [facility];  Service: Orthopedics;  Laterality: Right;  . Endarterectomy femoral Right 09/15/2012    Procedure: ENDARTERECTOMY FEMORAL;  Surgeon: Mal Misty, MD;  Location: [place];  Service: Vascular;  Laterality: Right;  . Femoral-popliteal bypass graft Right 09/15/2012    Procedure: BYPASS GRAFT FEMORAL-POPLITEAL ARTERY;  Surgeon: Mal Misty, MD;  Location: [place];  Service: Vascular;  Laterality: Right;  . Patch angioplasty Right 09/15/2012    Procedure: PATCH ANGIOPLASTY;  Surgeon: Mal Misty, MD;  Location: [place];  Service: Vascular;  Laterality: Right;  . Abdominal aortagram N/A 09/14/2012    Procedure: ABDOMINAL [name];  Surgeon: Serafina Mitchell, MD;  Location: [facility] CATH LAB;  Service: Cardiovascular;  Laterality: N/A;    SOCIAL HISTORY: Social History   Social History  . Marital Status: Married    Spouse Name: N/A  . Number of Children: N/A  . Years of Education: N/A   Occupational History  . Not on file.   Social History Main Topics  . Smoking status: Never Smoker   . Smokeless tobacco: Never Used  . Alcohol Use: No  . Drug Use: No  . Sexual Activity: No   Other Topics Concern  . Not on file   Social  History Narrative    FAMILY HISTORY: Family History  Problem Relation Age of Onset  . Diabetes Son   . Heart failure Mother   . Stroke Father   . Hypertension Sister   . Hypertension Sister   . Stroke Sister     Review of Systems  Constitutional: Positive for malaise/fatigue. Negative for fever, chills and weight loss.  HENT: Positive for dry mouth. Negative for congestion, hearing loss, nosebleeds, sore throat and tinnitus.   Mouth is dry and burns when she eats or drinks.  Eyes: Negative for blurred vision, double vision, pain and discharge.  Respiratory: Positive for shortness of breath, congestion. Negative for cough, hemoptysis, sputum production and wheezing.   Has a lot of congestion  in his lungs and it is hard to breathe.  Cardiovascular: Negative for chest pain, palpitations, claudication, leg swelling and PND.  Gastrointestinal: Negative for heartburn, nausea, vomiting, abdominal pain, diarrhea, constipation, blood in stool and melena.  Genitourinary: Negative for dysuria, urgency, frequency and hematuria.  Musculoskeletal: Negative for myalgias, joint pain and falls.  Skin: Negative for itching and rash.  Neurological: Positive for weakness. Negative for dizziness, tingling, tremors, sensory change, speech change, focal weakness, seizures, loss of consciousness and headaches.  Endo/Heme/Allergies: Does not bruise/bleed easily.  Psychiatric/Behavioral: Negative for depression, suicidal ideas, memory loss and substance abuse. The patient is not nervous/anxious and does not have insomnia.     PHYSICAL EXAMINATION  ECOG PERFORMANCE STATUS: 1 - Symptomatic but completely ambulatory  Filed Vitals:   11/29/15 1200  BP: 128/70  Pulse: 102  Temp: 98.9 F (37.2 C)  Resp: 22    Physical Exam  Constitutional: She is oriented to person, place, and time and well-developed, She is in distress Wearing O2. sats 89% on 3.5 L HENT:  Head: Normocephalic and atraumatic.  Nose:  Nose normal.  Mouth/Throat: Mucous membranes are dry and red. No oropharyngeal exudate.  Eyes: Conjunctivae and EOM are normal. Pupils are equal, round, and reactive to light. Right eye exhibits no discharge. Left eye exhibits no discharge. No scleral icterus.  Neck: Normal range of motion. Neck supple. No tracheal deviation present. No thyromegaly present.  Cardiovascular: Normal rate, regular rhythm and normal heart sounds.  Exam reveals no gallop and no friction rub.   No murmur heard. Pulmonary/Chest: Coarse BS scattered throughout.   Abdominal: Soft. Bowel sounds are normal. She exhibits no distension and no mass. There is no tenderness. There is no rebound and no guarding.  Musculoskeletal: Normal range of motion. She exhibits no edema.  Lymphadenopathy:    She has no cervical adenopathy.  Neurological: She is alert and oriented to person, place, and time. She has normal reflexes. No cranial nerve deficit In wheelchair Skin: Skin is warm and dry. No rash noted.   Nursing note and vitals reviewed.   LABORATORY DATA: I have reviwed the data as listed.  CBC    Component Value Date/Time   WBC 12.8* 11/29/2015 1206   RBC 5.42* 11/29/2015 1206   RBC 3.48* 08/19/2013 0828   HGB 13.1 11/29/2015 1206   HCT 44.3 11/29/2015 1206   PLT 280 11/29/2015 1206   MCV 81.7 11/29/2015 1206   MCH 24.2* 11/29/2015 1206   MCHC 29.6* 11/29/2015 1206   RDW 22.7* 11/29/2015 1206   LYMPHSABS 0.7 11/29/2015 1206   MONOABS 0.8 11/29/2015 1206   EOSABS 0.1 11/29/2015 1206   BASOSABS 0.2* 11/29/2015 1206   CMP     Component Value Date/Time   NA 141 11/21/2015 1439   K 4.0 11/21/2015 1439   CL 105 11/21/2015 1439   CO2 27 11/21/2015 1439   GLUCOSE 87 11/21/2015 1439   BUN 18 11/21/2015 1439   CREATININE 0.98 11/21/2015 1439   CREATININE 1.07 12/04/2014 1248   CALCIUM 9.0 11/21/2015 1439   PROT 6.4* 12/21/2014 1104   ALBUMIN 3.4* 08/10/2015 0620   AST 18 12/21/2014 1104   ALT 10* 12/21/2014  1104   ALKPHOS 96 12/21/2014 1104   BILITOT 1.5* 12/21/2014 1104   GFRNONAA 56* 11/21/2015 1439   GFRAA >60 11/21/2015 1439    ASSESSMENT and THERAPY PLAN:  JAK 2 positive MPD Atrial Fibrillation Anticoagulation with coumadin Hypoxemia   I am going to hold her hydrea. She  will return in 3 weeks for ongoing follow-up of her MPD. Based upon her PE, hypoxemia I believe she will need admission. I have called the ER attending and discussed her case with him. The patient and husband agree to go to the ED for further evaluation and stabilization.   She will return in 3 weeks for a follow up.   All questions were answered. The patient knows to call the clinic with any problems, questions or concerns. We can certainly see the patient much sooner if necessary. This note was electronically signed.  This document serves as a record of services personally performed by Ancil Linsey, MD. It was created on her behalf by Kandace Blitz, a trained medical scribe. The creation of this record is based on the scribe's personal observations and the provider's statements to them. This document has been checked and approved by the attending provider.  I have reviewed the above documentation for accuracy and completeness, and I agree with the above.  Kelby Fam. [place] MD

## 2015-11-29 NOTE — ED Provider Notes (Signed)
CSN: [id number]     Arrival date & time 11/29/15  1332 History   First MD Initiated Contact with Patient 11/29/15 1411     Chief Complaint  Patient presents with  . Shortness of Breath     (Consider location/radiation/quality/duration/timing/severity/associated sxs/prior Treatment) HPI   75yF with chronic atrial fibrillation, chronic diastolic heart failure, myeloproliferative disease, HTN, DVT and PVD. Presenting from cancer center where she was following up and noted to be hypoxic. She reports increasing dyspnea over the past 3-4 days. She was evaluated by cardiology on 4/19 though and progress note at that time mentions worsening dyspnea over the previous 2-3 weeks. Her torsemide was doubled to 40mg  for 5 days and she is currently back on 20mg  daily. She reports her LE edema improved but her dyspnea has not. She has baseline respiratory failure on 2-3L oxygen at baseline. Reports she saw her PCP two days ago for these symptoms and advised to increase her oxygen to 4L and also started on augmentin for possible infection. Symptoms have progressed despite this. She denies fever but has had chills. Has has increasing nonproductive cough. Denies any CP. Is on coumadin for hx of afib. Denies overt bleeding/melena/easy bruising, etc.   Cardiologist is Dr. Bronson Ing. Last ECHO was almost exactly a year ago and showed normal LV systolic function, EF 0000000. Atrial fibrillation limited assessment but had severe biatrial enlargement suggesting significant diastolic dysfunction.  Past Medical History  Diagnosis Date  . Hypertension   . Arthritis   . Varicose veins   . Deaf   . Elevated WBC count     and platelets  . Thyroid disease   . Irregular heart rate   . 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 Peripheral neuropathy (HCC)     feet  . 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 Atrial fibrillation, chronic ([place])   . 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 Peripheral vascular disease ([place])    . Ulcer of ankle ([place]) 05/2014    left and right ankle  . Heart failure ([place])   . Pulmonary edema   . DVT (deep venous thrombosis) [facility])    Past Surgical History  Procedure Laterality Date  . Blt    . Abdominal hysterectomy    . Coronary angioplasty    . Ct of abd    . Hip pinning,cannulated  11/19/2011    Procedure: CANNULATED HIP PINNING;  Surgeon: Sanjuana Kava, MD;  Location: [facility];  Service: Orthopedics;  Laterality: Right;  . Fasciotomy  11/29/2011    Procedure: FASCIOTOMY;  Surgeon: Carole Civil, MD;  Location: [facility];  Service: Orthopedics;  Laterality: Right;  right thigh   . Dressing change under anesthesia  12/02/2011    Procedure: DRESSING CHANGE UNDER ANESTHESIA;  Surgeon: Carole Civil, MD;  Location: [facility];  Service: Orthopedics;  Laterality: Right;  . Endarterectomy femoral Right 09/15/2012    Procedure: ENDARTERECTOMY FEMORAL;  Surgeon: Mal Misty, MD;  Location: [place];  Service: Vascular;  Laterality: Right;  . Femoral-popliteal bypass graft Right 09/15/2012    Procedure: BYPASS GRAFT FEMORAL-POPLITEAL ARTERY;  Surgeon: Mal Misty, MD;  Location: [place];  Service: Vascular;  Laterality: Right;  . Patch angioplasty Right 09/15/2012    Procedure: PATCH ANGIOPLASTY;  Surgeon: Mal Misty, MD;  Location: [place];  Service: Vascular;  Laterality: Right;  . Abdominal aortagram N/A 09/14/2012    Procedure: ABDOMINAL [name];  Surgeon: Serafina Mitchell, MD;  Location: [facility] CATH LAB;  Service:  Cardiovascular;  Laterality: N/A;   Family History  Problem Relation Age of Onset  . Diabetes Son   . Heart failure Mother   . Stroke Father   . Hypertension Sister   . Hypertension Sister   . Stroke Sister    Social History  Substance Use Topics  . Smoking status: Never Smoker   . Smokeless tobacco: Never Used  . Alcohol Use: No   OB History    No data available     Review of Systems  All systems reviewed and negative, other than as noted in HPI.    Allergies  Review of patient's allergies indicates no known allergies.  Home Medications   Prior to Admission medications   Medication Sig Start Date End Date Taking? Authorizing Provider  amoxicillin-clavulanate (AUGMENTIN) 875-125 MG tablet  11/28/15   Historical Provider, MD  aspirin EC 81 MG tablet Take 81 mg by mouth daily.    Historical Provider, MD  benzonatate (TESSALON) 100 MG capsule  11/28/15   Historical Provider, MD  CARTIA XT 240 MG 24 hr capsule Take 240 mg by mouth daily.  06/18/15   Historical Provider, MD  Cholecalciferol (VITAMIN D3) 5000 UNITS CAPS Take 1 capsule by mouth daily.    Historical Provider, MD  gabapentin (NEURONTIN) 100 MG capsule Take 100 mg by mouth 3 (three) times daily.      Historical Provider, MD  hydroxyurea (HYDREA) 500 MG capsule Take 500 mg PO BID Monday- Friday.  May take with food to minimize GI side effects. Patient taking differently: Take 500 mg by mouth 2 (two) times daily. Monday- Friday ONLY.  May take with food to minimize GI side effects. 06/14/15   Baird Cancer, PA-C  levothyroxine (SYNTHROID, LEVOTHROID) 75 MCG tablet Take 75 mcg by mouth daily before breakfast.  10/16/14   Historical Provider, MD  metoprolol (LOPRESSOR) 100 MG tablet Take 100 mg by mouth 2 (two) times daily. 06/05/15   Historical Provider, MD  Potassium Chloride ER 20 MEQ TBCR Take 20 mEq by mouth daily. 11/29/14   Radene Gunning, NP  torsemide (DEMADEX) 20 MG tablet Take 1 tablet (20 mg total) by mouth daily. 11/28/14   Radene Gunning, NP  warfarin (COUMADIN) 5 MG tablet TAKE AS DIRECTED BY  COUMADIN  CLINIC 11/05/15   Herminio Commons, MD   BP 135/83 mmHg  Pulse 94  Temp(Src) 98.8 F (37.1 C) (Oral)  Resp 24  Ht 6' (1.829 m)  Wt 175 lb (79.379 kg)  BMI 23.73 kg/m2  SpO2 90% Physical Exam  Constitutional: She appears well-developed and well-nourished. No distress.  Sitting in bed. NAD. Hard of hearing.   HENT:  Head: Normocephalic and atraumatic.  Eyes:  Conjunctivae are normal. Right eye exhibits no discharge. Left eye exhibits no discharge.  Neck: Neck supple.  Cardiovascular: Regular rhythm and normal heart sounds.  Exam reveals no gallop and no friction rub.   No murmur heard. Irregularly irregular  Pulmonary/Chest:  Mild tachypnea. No accessory muscle usage. rhonchorous breath sounds b/l.   Abdominal: Soft. She exhibits no distension. There is no tenderness.  Musculoskeletal: She exhibits edema. She exhibits no tenderness.  Mild LE edema with chronic stasis changes  Neurological: She is alert.  Skin: Skin is warm and dry. She is not diaphoretic.  Psychiatric: She has a normal mood and affect. Her behavior is normal. Thought content normal.  Nursing note and vitals reviewed.   ED Course  Procedures (including critical care time) Labs Review  Labs Reviewed  CBC - Abnormal; Notable for the following:    WBC 12.1 (*)    RBC 5.44 (*)    MCH 23.9 (*)    MCHC 29.3 (*)    RDW 22.8 (*)    All other components within normal limits  COMPREHENSIVE METABOLIC PANEL - Abnormal; Notable for the following:    BUN 21 (*)    Calcium 8.5 (*)    Albumin 3.4 (*)    Total Bilirubin 1.9 (*)    GFR calc non Af Amer 57 (*)    All other components within normal limits  BRAIN NATRIURETIC PEPTIDE - Abnormal; Notable for the following:    B Natriuretic Peptide 816.0 (*)    All other components within normal limits  PROTIME-INR - Abnormal; Notable for the following:    Prothrombin Time 30.9 (*)    INR 3.03 (*)    All other components within normal limits  TROPONIN I    Imaging Review Dg Chest 2 View  11/29/2015  CLINICAL DATA:  Productive cough, congestion since Monday. Increased sob/hypoxia x 2 days. Oxygen upped to 6 liters from 4 constantly. EXAM: CHEST  2 VIEW COMPARISON:  08/09/2015 FINDINGS: There are moderate left and small right pleural effusions. The left has decreased in size from prior x-ray. Right is stable. There is associated basilar  lung opacity, greater on the left, most likely atelectasis. There are prominent bronchovascular markings but no overt pulmonary edema. No No pneumothorax. Cardiac silhouette is mildly enlarged. No mediastinal or hilar masses or evidence of adenopathy. Bony thorax is demineralized but grossly intact. IMPRESSION: 1. No acute cardiopulmonary disease. 2. Moderate left and small right pleural effusions with associated lung base atelectasis. No convincing pulmonary edema. Left pleural effusion is smaller than it was on the prior chest radiographs. Electronically Signed   By: Lajean Manes M.D.   On: 11/29/2015 14:30   I have personally reviewed and evaluated these images and lab results as part of my medical decision-making.   EKG Interpretation   Date/Time:  Thursday November 29 2015 14:07:54 EDT Ventricular Rate:  82 PR Interval:    QRS Duration: 89 QT Interval:  343 QTC Calculation: 400 R Axis:   128 Text Interpretation:  Atrial fibrillation Ventricular premature complex  Right axis deviation Nonspecific T abnormalities, anterior leads Confirmed  by Kaylani Fromme  MD, Ikechukwu Cerny ([id number]) on 11/29/2015 3:21:38 PM      MDM   Final diagnoses:  Acute on chronic respiratory failure with hypoxia (HCC)    75yF with increasing dyspnea and increasing oxygen requirement. I'm not completely sure of exact etiology. Probably multifactorial. Hx of diastolic HF. Diuretic recently increased with improving peripheral edema, but not dyspnea. BNP elevated but stable from 8 days ago. CXR w/o overt edema. Hx of afib. Ventricular rate is somewhat elevated, but not terribly. Mostly 90-100 range but occasionally higher. Will give a dose of cardizem. She reports cough, chills and has leukocytosis, but elevated WBC appears chronic and may reflect underlying myelodysplastic disorder. Will cover with abx at this time, but not convinced of infectious bacterial process. Possibly viral? Doubt ACS. Atypical symptoms. Troponin normal after  days-weeks of symptoms. Consider PE but not likely with INR of 3. Given lasix, abx and breathing tx in ED. Remains symptomatic at rest and needing increased oxygen from her baseline.     Virgel Manifold, MD 12/05/15 1323

## 2015-11-29 NOTE — ED Notes (Signed)
Pts sats 87-88% on O2 at 6 L via [place]. No resp distress noted.  Dr Wilson Singer aware  No new orders

## 2015-11-29 NOTE — Patient Instructions (Addendum)
[place] at [facility] Discharge Instructions  RECOMMENDATIONS MADE BY THE CONSULTANT AND ANY TEST RESULTS WILL BE SENT TO YOUR REFERRING PHYSICIAN.   Exam and discussion by Dr Whitney Muse today Hold hydrea til you come back to see [place] in 3 years  Return to see the doctor in 3 weeks with labs Please call the clinic if you have any questions or concerns     Thank you for choosing [place] at [facility] to provide your oncology and hematology care.  To afford each patient quality time with our provider, please arrive at least 15 minutes before your scheduled appointment time.   Beginning January 23rd 2017 lab work for the [facility] will be done in the  Main lab at [facility] on 1st floor. If you have a lab appointment with the [place] please come in thru the  Main Entrance and check in at the main information desk  You need to re-schedule your appointment should you arrive 10 or more minutes late.  We strive to give you quality time with our providers, and arriving late affects you and other patients whose appointments are after yours.  Also, if you no show three or more times for appointments you may be dismissed from the clinic at the providers discretion.     Again, thank you for choosing [facility].  Our hope is that these requests will decrease the amount of time that you wait before being seen by our physicians.       _____________________________________________________________  Should you have questions after your visit to [facility], please contact our office at [phone] between the hours of 8:30 a.m. and 4:30 p.m.  Voicemails left after 4:30 p.m. will not be returned until the following business day.  For prescription refill requests, have your pharmacy contact our office.         Resources For Cancer Patients and their Caregivers ? American Cancer Society: Can assist with  transportation, wigs, general needs, runs Look Good Feel Better.        6706791924 ? Cancer Care: Provides financial assistance, online support groups, medication/co-pay assistance.  1-800-813-HOPE 660-328-3799) ? [place] Assists [place] cancer patients and their families through emotional , educational and financial support.  603-728-1509 ? [place] DSS Where to apply for food stamps, Medicaid and utility assistance. 936-198-5168 ? [facility]: Transportation to medical appointments. 619-309-6009 ? Social Security Administration: May apply for disability if have a Stage IV cancer. 864-470-0669 336-258-5645 ? [employer], Disability and Transit Services: Assists with nutrition, care and transit needs. 602-872-8990

## 2015-11-29 NOTE — ED Notes (Signed)
Pt states she has been more short of breath than normal.  Pt was seen by primary doctor yesterday and found to be hypoxic.  Instructed to turn oxygen up to 4L and to wear 24/7.  Pt was seen at the cancer center today and found to be hypoxic and sent to ER for eval.  Pt in no distress.

## 2015-11-30 ENCOUNTER — Encounter (HOSPITAL_COMMUNITY): Payer: Self-pay | Admitting: Hematology & Oncology

## 2015-11-30 ENCOUNTER — Inpatient Hospital Stay (HOSPITAL_COMMUNITY): Payer: Medicare Other

## 2015-11-30 DIAGNOSIS — R0902 Hypoxemia: Secondary | ICD-10-CM

## 2015-11-30 DIAGNOSIS — J189 Pneumonia, unspecified organism: Secondary | ICD-10-CM

## 2015-11-30 DIAGNOSIS — I509 Heart failure, unspecified: Secondary | ICD-10-CM

## 2015-11-30 DIAGNOSIS — D45 Polycythemia vera: Secondary | ICD-10-CM

## 2015-11-30 DIAGNOSIS — I10 Essential (primary) hypertension: Secondary | ICD-10-CM

## 2015-11-30 DIAGNOSIS — I482 Chronic atrial fibrillation: Secondary | ICD-10-CM

## 2015-11-30 DIAGNOSIS — R0600 Dyspnea, unspecified: Secondary | ICD-10-CM

## 2015-11-30 DIAGNOSIS — J9621 Acute and chronic respiratory failure with hypoxia: Secondary | ICD-10-CM

## 2015-11-30 DIAGNOSIS — I5033 Acute on chronic diastolic (congestive) heart failure: Secondary | ICD-10-CM

## 2015-11-30 LAB — BASIC METABOLIC PANEL
Anion gap: 11 (ref 5–15)
BUN: 16 mg/dL (ref 6–20)
CHLORIDE: 101 mmol/L (ref 101–111)
CO2: 28 mmol/L (ref 22–32)
CREATININE: 0.84 mg/dL (ref 0.44–1.00)
Calcium: 8.2 mg/dL — ABNORMAL LOW (ref 8.9–10.3)
GFR calc Af Amer: >60 mL/min (ref >60)
GLUCOSE: 93 mg/dL (ref 65–99)
POTASSIUM: 2.8 mmol/L — AB (ref 3.5–5.1)
SODIUM: 140 mmol/L (ref 135–145)

## 2015-11-30 LAB — ECHOCARDIOGRAM COMPLETE
HEIGHTINCHES: 72 in
WEIGHTICAEL: 2701.96 [oz_av]

## 2015-11-30 LAB — PROTIME-INR
INR: 4.31 — ABNORMAL HIGH (ref 0.00–1.49)
PROTHROMBIN TIME: 40.2 s — AB (ref 11.6–15.2)

## 2015-11-30 MED ORDER — POTASSIUM CHLORIDE CRYS ER 20 MEQ PO TBCR
40.0000 meq | EXTENDED_RELEASE_TABLET | Freq: Two times a day (BID) | ORAL | Status: AC
  Administered 2015-11-30 (×2): 40 meq via ORAL
  Filled 2015-11-30 (×2): qty 2

## 2015-11-30 MED ORDER — POTASSIUM CHLORIDE CRYS ER 10 MEQ PO TBCR: 20.0000 meq | EXTENDED_RELEASE_TABLET | Freq: Every day | ORAL | Status: DC

## 2015-11-30 MED ORDER — TORSEMIDE 20 MG PO TABS
40.0000 mg | ORAL_TABLET | Freq: Every day | ORAL | Status: DC
  Administered 2015-12-01 – 2015-12-03 (×3): 40 mg via ORAL
  Filled 2015-11-30 (×3): qty 2

## 2015-11-30 NOTE — Consult Note (Signed)
CARDIOLOGY CONSULT NOTE   Patient ID: Charlotte [name] MRN: [id number] DOB/AGE: [birth year] 75 y.o.  Admit Date: 11/29/2015 Referring Physician: TRH-Chui MD Primary Physician: [name], MD Consulting Cardiologist: Kate Sable  Primary Cardiologist Kate Sable MD Reason for Consultation: Acute on Chronic Diastolic CHF and atrial fibrillation  Clinical Summary Charlotte [name] is a 75 y.o.female with known history of chronic atrial fibrillation on Coumadin, CHADS VASC score of  5, (hypertrension,female, age, vascular disease,) chronic diastolic heart failure, polycythemia vera, hypertension, and peripheral vascular disease status post right femoral pop bypass grafting and endarterectomy. She has recently seen in our office on 11/21/2015 on referral from primary care with worsening dyspnea on exertion edema and weight gain. She admitted to dietary noncompliance, especially eating out. On that office visit the patient had torsemide increased to 40 mg daily for 5 days and increase potassium to 40 mEq for 5 days, and thereafter return to Demadex 20 mg daily along with potassium  20 mEq daily. Weight in the office was 180 pounds.  Unfortunately, the patient presented to the emergency room on 11/29/2015 with worsening dyspnea, cough, chills, and edema. The patient has been seen by her primary care physician the day before and was given an injection and prescription for Augmentin by PCP. The patient states that after increasing her dose of torsemide as directed on last cardiology office visit she did urinate more and lower extremity edema was improved. She returned to normal daily dosing on Sunday, but cold symptoms and cough and congestion worsened by Monday. She saw her primary care physician on Wednesday. She had a follow-up appointment with oncology, Dr. Whitney Muse who sent her to ER after evaluating her in her office on Thursday. 11/29/2015.  On arrival to the emergency room the  patient's blood pressure was 120/70, heart rate 89, O2 sat 89% on room air, she was afebrile. PTT 30.9, INR 3.03. Blood blood cells were elevated to 12.1. Potassium was 3.5 BUN 21 creatinine 0.97. Bili was elevated at 1.9. BNP 816. Chest x-ray revealed no acute cardiopulmonary disease, moderate left and small right pleural effusions with associated lung base atelectasis. No events in pulmonary edema, the left pleural effusion was smaller than it was on prior chest radiographs. No EKG was ordered. The patient was given IV Lasix 60 mg 1, with 470 cc fluid output, started on IV Rocephin, and IV azithromycin. The patient was also given 10 mg of diltiazem IV. We are asked for cardiology recommendations for diastolic heart failure and atrial fibrillation management. Weight on admission 175 lbs, weight this a.m. 168 lbs.   She states that she is feeling much better, continues to have some cough and congestion.  No Known Allergies  Medications Scheduled Medications: . antiseptic oral rinse  7 mL Mouth Rinse BID  . aspirin EC  81 mg Oral Daily  . azithromycin  250 mg Intravenous Q24H  . cefTRIAXone (ROCEPHIN)  IV  1 g Intravenous Q24H  . diltiazem  120 mg Oral Daily  . feeding supplement (ENSURE ENLIVE)  237 mL Oral BID BM  . furosemide  60 mg Intravenous Q12H  . gabapentin  100 mg Oral TID  . hydroxyurea  500 mg Oral BID  . levothyroxine  75 mcg Oral QAC breakfast  . lisinopril  10 mg Oral Daily  . metoprolol  100 mg Oral BID  . [START ON 12/01/2015] potassium chloride  20 mEq Oral Daily  . potassium chloride  40 mEq Oral BID  . sodium  chloride flush  3 mL Intravenous Q12H    Infusions:    PRN Medications: sodium chloride, acetaminophen, albuterol, ondansetron (ZOFRAN) IV, sodium chloride flush   Past Medical History  Diagnosis Date  . Hypertension   . Arthritis   . Varicose veins   . Deaf   . Elevated WBC count     and platelets  . Thyroid disease   . Irregular heart rate   .  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 Peripheral neuropathy (HCC)     feet  . 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 Atrial fibrillation, chronic ([place])   . 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 Peripheral vascular disease ([place])   . Ulcer of ankle ([place]) 05/2014    left and right ankle  . Heart failure ([place])   . Pulmonary edema   . DVT (deep venous thrombosis) [name])     Past Surgical History  Procedure Laterality Date  . Blt    . Abdominal hysterectomy    . Coronary angioplasty    . Ct of abd    . Hip pinning,cannulated  11/19/2011    Procedure: CANNULATED HIP PINNING;  Surgeon: Sanjuana Kava, MD;  Location: [facility];  Service: Orthopedics;  Laterality: Right;  . Fasciotomy  11/29/2011    Procedure: FASCIOTOMY;  Surgeon: Carole Civil, MD;  Location: [facility];  Service: Orthopedics;  Laterality: Right;  right thigh   . Dressing change under anesthesia  12/02/2011    Procedure: DRESSING CHANGE UNDER ANESTHESIA;  Surgeon: Carole Civil, MD;  Location: [facility];  Service: Orthopedics;  Laterality: Right;  . Endarterectomy femoral Right 09/15/2012    Procedure: ENDARTERECTOMY FEMORAL;  Surgeon: Mal Misty, MD;  Location: [place];  Service: Vascular;  Laterality: Right;  . Femoral-popliteal bypass graft Right 09/15/2012    Procedure: BYPASS GRAFT FEMORAL-POPLITEAL ARTERY;  Surgeon: Mal Misty, MD;  Location: [place];  Service: Vascular;  Laterality: Right;  . Patch angioplasty Right 09/15/2012    Procedure: PATCH ANGIOPLASTY;  Surgeon: Mal Misty, MD;  Location: [place];  Service: Vascular;  Laterality: Right;  . Abdominal aortagram N/A 09/14/2012    Procedure: ABDOMINAL [name];  Surgeon: Serafina Mitchell, MD;  Location: [facility] CATH LAB;  Service: Cardiovascular;  Laterality: N/A;    Family History  Problem Relation Age of Onset  . Diabetes Son   . Heart failure Mother   . Stroke Father   . Hypertension Sister   . Hypertension Sister   . Stroke Sister     Social  History Charlotte [name] reports that she has never smoked. She has never used smokeless tobacco. Charlotte [name] reports that she does not drink alcohol.  Review of Systems Complete review of systems are found to be negative unless outlined in H&P above.  Physical Examination Blood pressure 130/69, pulse 98, temperature 98.6 F (37 C), temperature source Oral, resp. rate 20, height 6' (1.829 m), weight 168 lb 14 oz (76.6 kg), SpO2 91 %.  Intake/Output Summary (Last 24 hours) at 11/30/15 0804 Last data filed at 11/29/15 1725  Gross per 24 hour  Intake      0 ml  Output    470 ml  Net   -470 ml    Telemetry: Atrial fibrillation with occasional PVCs  GEN: Ill-appearing in no acute distress HEENT: Conjunctiva and lids normal, oropharynx clear with moist mucosa. Neck: Supple, no elevated JVP or carotid bruits, no thyromegaly. Lungs: Bilateral wheezes on inspiration, with crackles. Cardiac: Irregular rate  and rhythm, no S3 or significant systolic murmur, no pericardial rub. Abdomen: Soft, nontender, no hepatomegaly, bowel sounds present, no guarding or rebound. Extremities: No pitting edema, distal pulses 2+. Large well-healed incision in the upper right thigh. Skin: Warm and dry. Musculoskeletal: No kyphosis. Neuropsychiatric: Alert and oriented x3, affect grossly appropriate.  Prior Cardiac Testing/Procedures  1. Echocardiogram 11/22/2014 Left ventricle: The cavity size was normal. Wall thickness was normal. Systolic function was normal. The estimated ejection fraction was in the range of 55% to 60%. Wall motion was normal; there were no regional wall motion abnormalities. - Aortic valve: Mildly calcified annulus. Trileaflet; mildly thickened leaflets. Valve area (VTI): 2.15 cm^2. Valve area (Vmax): 2.35 cm^2. - Mitral valve: Mildly calcified annulus. Mildly thickened leaflets . There was mild regurgitation. - Left atrium: The atrium was severely dilated. - Right ventricle:  The cavity size was mildly dilated. - Right atrium: The atrium was severely dilated. - Atrial septum: No defect or patent foramen ovale was identified. - Pulmonary arteries: Systolic pressure was moderately increased. PA peak pressure: 50 mm Hg (S). - Inferior vena cava: The vessel was dilated. The respirophasic diameter changes were blunted (< 50%), consistent with elevated central venous pressure. - Pericardium, extracardiac: There is a large left pleural effusion.  NM Study 08/17/2013 IMPRESSION: Low risk Lexiscan Cardiolite. There were no diagnostic ST segment abnormalities, atrial fibrillation persistent throughout. Perfusion imaging is consistent with breast attenuation affecting the anteroseptal wall toward the apex, no clear evidence of scar or ischemia. LV volumes are normal with normal LVEF of 61%. Lab Results  Basic Metabolic Panel:  Recent Labs Lab 11/29/15 1349 11/30/15 0423  NA 138 140  K 3.5 2.8*  CL 101 101  CO2 27 28  GLUCOSE 91 93  BUN 21* 16  CREATININE 0.97 0.84  CALCIUM 8.5* 8.2*    Liver Function Tests:  Recent Labs Lab 11/29/15 1349  AST 20  ALT 15  ALKPHOS 66  BILITOT 1.9*  PROT 6.5  ALBUMIN 3.4*    CBC:  Recent Labs Lab 11/29/15 1206 11/29/15 1349  WBC 12.8* 12.1*  NEUTROABS 11.0*  --   HGB 13.1 13.0  HCT 44.3 44.4  MCV 81.7 81.6  PLT 280 270    Cardiac Enzymes:  Recent Labs Lab 11/29/15 1349  TROPONINI <0.03    BNP: Invalid input(s): POCBNP   Radiology: Dg Chest 2 View  11/29/2015  CLINICAL DATA:  Productive cough, congestion since Monday. Increased sob/hypoxia x 2 days. Oxygen upped to 6 liters from 4 constantly. EXAM: CHEST  2 VIEW COMPARISON:  08/09/2015 FINDINGS: There are moderate left and small right pleural effusions. The left has decreased in size from prior x-ray. Right is stable. There is associated basilar lung opacity, greater on the left, most likely atelectasis. There are prominent  bronchovascular markings but no overt pulmonary edema. No No pneumothorax. Cardiac silhouette is mildly enlarged. No mediastinal or hilar masses or evidence of adenopathy. Bony thorax is demineralized but grossly intact. IMPRESSION: 1. No acute cardiopulmonary disease. 2. Moderate left and small right pleural effusions with associated lung base atelectasis. No convincing pulmonary edema. Left pleural effusion is smaller than it was on the prior chest radiographs. Electronically Signed   By: Lajean Manes M.D.   On: 11/29/2015 14:30     ECG: Dated 11/29/2014: Atrial fibrillation rate of 82 bpm with nonspecific T-wave abnormalities.   Impression and Recommendations  1. Acute on chronic diastolic heart failure: No evidence of decompensation on examination this a.m. There  is no evidence of lower extremity edema, weight is down 5 pounds from initial office evaluation at 180 pounds down to 175 pounds, this a.m. she isn't 168 pounds after one dose of IV furosemide. She remains on IV furosemide 60 mg every 12 hours. We will transition back to by mouth on dose she was taking at home of torsemide 40 mg daily. She is hypokalemic this a.m. with a potassium of 2.8 and is receiving by mouth potassium replacement. Will repeat echo.  2. Atrial fibrillation: Heart rate is currently controlled on 120 mg daily. Coumadin is currently on hold due to elevated INR (3.03). We'll consult pharmacy for dosing. CHADS VASC score of 5.  3. Community-acquired pneumonia:. This is being treated by Hosppitalist service with IV antibiotics.   4.  Oxygen-dependent COPD: Remains on oxygen via nasal cannula. Complaining of burning in her nose and throat. We'll add humidification.   Signed: [name]. Lawrence NP [place]  11/30/2015, 8:04 AM Co-Sign MD  The patient was seen and examined, and I agree with the history, physical exam, assessment and plan as documented above which has been discussed with Arnold Long NP, with modifications  as noted below. Upon reviewing chest films, left pleural effusion is moderate in size and slightly smaller when compared to January. She is about to be administered another dose of IV Lasix 60 mg. The patient feels better when wearing oxygen.  No convincing pulmonary edema nor infiltrate. Presumptively being treated for both community acquired pneumonia and acute on chronic diastolic heart failure.  Discussed with Dr. Wyline Copas. If necessary, can give another dose of IV Lasix this evening and over the weekend as deemed necessary, to see if resolution of pleural effusion improves oxygenation with concomitant treatment for pneumonia. Otherwise, agree with switching back to oral diuretics.  Kate Sable, MD, [facility]  11/30/2015 9:44 AM

## 2015-11-30 NOTE — Care Management Note (Signed)
Case Management Note  Patient Details  Name: Charlotte [name] MRN: [id number] Date of Birth: 03-Aug-1941  Subjective/Objective:                  Pt admitted with HF. Pt is from home, lives with husband and grandson. Pt has PCP, drives herself to appointments and has no difficulty affording medications. Pt has home O2 with port tank for PRN use. Pt does NOT have neb machine. Pt has cane, walker, WC, BSC at home if she needs them. Pt is ind with ADL's. Pt plans to return home with self care at DC.   Action/Plan: No CM needs anticipated.   Expected Discharge Date:  12/02/15               Expected Discharge Plan:  Home/Self Care  In-House Referral:  NA  Discharge planning Services  CM Consult  Post Acute Care Choice:  NA Choice offered to:  NA  DME Arranged:    DME Agency:     HH Arranged:    HH Agency:     Status of Service:  Completed, signed off  Medicare Important Message Given:  Yes Date Medicare IM Given:    Medicare IM give by:    Date Additional Medicare IM Given:    Additional Medicare Important Message give by:     If discussed at [place] of Stay Meetings, dates discussed:    Additional Comments:  Sherald Barge, RN 11/30/2015, 1:36 PM

## 2015-11-30 NOTE — Progress Notes (Signed)
PROGRESS NOTE    Charlotte [name]  [id number] DOB: Jan 25, 1941 DOA: 11/29/2015 PCP: [name], MD  Outpatient Specialists:     Brief Narrative:  75 y.o. female with history of DVT, PVD, ankle ulcer, chron afib, polycythemia vera, neuropathy, deafness, DJD and HTN. Hx of diast CHF and PNA this January, treated here at [facility]. Patient was seen by Hem-Onc today in clinic for f/u of PV, on Hydrea. Pt noted worsening SOB, and low O2 levels at home despite home O2. Sent to ED for evaluation. In ED SaO2's are high 80's to low 90's on 6L [place] O2. Pt reports SOB for 2-3 days. She has had chills and some prod cough, was seen by PCP yesterday and received " a shot" and Rx for augmentin. SOB worse today. No chest pain, no abd pain, n/v/d. No voiding problems. Takes torsemide at home. Patient was admitted for work up of hypoxia   Assessment & Plan:   Principal Problem:   Acute on chronic diastolic (congestive) heart failure (HCC) Active Problems:   Polycythemia vera (HCC)   Chronic atrial fibrillation (HCC)   HTN (hypertension)   Dyspnea   Cough   Hypoxemia   Community acquired pneumonia   CHF (congestive heart failure) ([place])   Acute on chronic respiratory failure with hypoxia ([place])   1. Cough / SOB / hypoxemia -  1. in patient w known diastolic CHF.  2. No fever/ WBC slightly up.  3. Suspect acute/ chron diast HF,  4. Patient has been continued with IV lasix and empirically IV abx for CAP.  5. Doubt PE with INR 3.0 on coumadin.  6. Cardiology was consulted and appreciate input 2. Polycythemia vera -  1. on hydrea 3. Chron afib  1. on coumadin 4. Hypothyroid 1. Cont thyroid replacement 5. Pulm HTN /diast dysfunction -  1. 2d echo done 4/28, pending results 6. HTN 1. Cont on diltiazem/ BB   DVT prophylaxis: coumadin Code Status: Full Family Communication: Pt in room Disposition Plan: Unclear at this point   Consultants:   Cardiology  Procedures:  2d  echo 4/28  Antimicrobials:   Rocephin 4/28>>>  azithro 4/28>>>    Subjective: Reports feeling somewhat better today  Objective: Filed Vitals:   11/29/15 2000 11/29/15 2026 11/29/15 2056 11/30/15 0630  BP: 134/81 125/87 143/80 130/69  Pulse: 97 102 104 98  Temp:  98.4 F (36.9 C) 99.2 F (37.3 C) 98.6 F (37 C)  TempSrc:  Oral Oral Oral  Resp: 26 24  20   Height:   6' (1.829 m)   Weight:   77.6 kg (171 lb 1.2 oz) 76.6 kg (168 lb 14 oz)  SpO2: 88% 89% 91% 91%    Intake/Output Summary (Last 24 hours) at 11/30/15 1309 Last data filed at 11/30/15 1220  Gross per 24 hour  Intake    240 ml  Output    470 ml  Net   -230 ml   Filed Weights   11/29/15 1336 11/29/15 2056 11/30/15 0630  Weight: 79.379 kg (175 lb) 77.6 kg (171 lb 1.2 oz) 76.6 kg (168 lb 14 oz)    Examination:  General exam: Appears calm and comfortable  Respiratory system: Clear to auscultation. Respiratory effort normal. Cardiovascular system: S1 & S2 heard, RRR.Marland Kitchen Gastrointestinal system: Abdomen is nondistended, soft and nontender. No organomegaly or masses felt. Normal bowel sounds heard. Central nervous system: Alert and oriented. No focal neurological deficits. Extremities: Symmetric 5 x 5 power. Skin: No rashes, lesions Psychiatry:  Judgement and insight appear normal. Mood & affect appropriate.     Data Reviewed: I have personally reviewed following labs and imaging studies  CBC:  Recent Labs Lab 11/29/15 1206 11/29/15 1349  WBC 12.8* 12.1*  NEUTROABS 11.0*  --   HGB 13.1 13.0  HCT 44.3 44.4  MCV 81.7 81.6  PLT 280 [id number]   Basic Metabolic Panel:  Recent Labs Lab 11/29/15 1349 11/30/15 0423  NA 138 140  K 3.5 2.8*  CL 101 101  CO2 27 28  GLUCOSE 91 93  BUN 21* 16  CREATININE 0.97 0.84  CALCIUM 8.5* 8.2*   GFR: Estimated Creatinine Clearance: 68.8 mL/min (by C-G formula based on Cr of 0.84). Liver Function Tests:  Recent Labs Lab 11/29/15 1349  AST 20  ALT 15  ALKPHOS 66   BILITOT 1.9*  PROT 6.5  ALBUMIN 3.4*   No results for input(s): LIPASE, AMYLASE in the last 168 hours. No results for input(s): AMMONIA in the last 168 hours. Coagulation Profile:  Recent Labs Lab 11/29/15 1415 11/30/15 1019  INR 3.03* 4.31*   Cardiac Enzymes:  Recent Labs Lab 11/29/15 1349  TROPONINI <0.03   BNP (last 3 results) No results for input(s): PROBNP in the last 8760 hours. HbA1C: No results for input(s): HGBA1C in the last 72 hours. CBG: No results for input(s): GLUCAP in the last 168 hours. Lipid Profile: No results for input(s): CHOL, HDL, LDLCALC, TRIG, CHOLHDL, LDLDIRECT in the last 72 hours. Thyroid Function Tests: No results for input(s): TSH, T4TOTAL, FREET4, T3FREE, THYROIDAB in the last 72 hours. Anemia Panel: No results for input(s): VITAMINB12, FOLATE, FERRITIN, TIBC, IRON, RETICCTPCT in the last 72 hours. Urine analysis:    Component Value Date/Time   COLORURINE YELLOW 09/14/2012 2337   APPEARANCEUR CLEAR 09/14/2012 2337   LABSPEC 1.020 09/14/2012 2337   PHURINE 6.5 09/14/2012 2337   GLUCOSEU NEGATIVE 09/14/2012 2337   HGBUR NEGATIVE 09/14/2012 2337   BILIRUBINUR NEGATIVE 09/14/2012 2337   KETONESUR NEGATIVE 09/14/2012 2337   PROTEINUR NEGATIVE 09/14/2012 2337   UROBILINOGEN 1.0 09/14/2012 2337   NITRITE NEGATIVE 09/14/2012 2337   LEUKOCYTESUR NEGATIVE 09/14/2012 2337   Sepsis Labs: @LABRCNTIP (procalcitonin:4,lacticidven:4)  )No results found for this or any previous visit (from the past 240 hour(s)).       Radiology Studies: Dg Chest 2 View  11/29/2015  CLINICAL DATA:  Productive cough, congestion since Monday. Increased sob/hypoxia x 2 days. Oxygen upped to 6 liters from 4 constantly. EXAM: CHEST  2 VIEW COMPARISON:  08/09/2015 FINDINGS: There are moderate left and small right pleural effusions. The left has decreased in size from prior x-ray. Right is stable. There is associated basilar lung opacity, greater on the left, most  likely atelectasis. There are prominent bronchovascular markings but no overt pulmonary edema. No No pneumothorax. Cardiac silhouette is mildly enlarged. No mediastinal or hilar masses or evidence of adenopathy. Bony thorax is demineralized but grossly intact. IMPRESSION: 1. No acute cardiopulmonary disease. 2. Moderate left and small right pleural effusions with associated lung base atelectasis. No convincing pulmonary edema. Left pleural effusion is smaller than it was on the prior chest radiographs. Electronically Signed   By: Lajean Manes M.D.   On: 11/29/2015 14:30        Scheduled Meds: . antiseptic oral rinse  7 mL Mouth Rinse BID  . aspirin EC  81 mg Oral Daily  . azithromycin  250 mg Intravenous Q24H  . cefTRIAXone (ROCEPHIN)  IV  1 g Intravenous Q24H  .  diltiazem  120 mg Oral Daily  . feeding supplement (ENSURE ENLIVE)  237 mL Oral BID BM  . gabapentin  100 mg Oral TID  . hydroxyurea  500 mg Oral BID  . levothyroxine  75 mcg Oral QAC breakfast  . lisinopril  10 mg Oral Daily  . metoprolol  100 mg Oral BID  . [START ON 12/01/2015] potassium chloride  20 mEq Oral Daily  . potassium chloride  40 mEq Oral BID  . sodium chloride flush  3 mL Intravenous Q12H  . torsemide  40 mg Oral Daily   Continuous Infusions:    LOS: 1 day    CHIU, Orpah Melter, MD [facility] Pager [phone]  If 7PM-7AM, please contact night-coverage [email or web address] Password [email or web address] 11/30/2015, 1:09 PM

## 2015-11-30 NOTE — Care Management Important Message (Signed)
Important Message  Patient Details  Name: Charlotte [name] MRN: [id number] Date of Birth: March 17, 1941   Medicare Important Message Given:  Yes    Sherald Barge, RN 11/30/2015, 1:34 PM

## 2015-11-30 NOTE — Progress Notes (Signed)
ANTICOAGULATION CONSULT NOTE - Initial Consult  Pharmacy Consult for coumadin Indication: atrial fibrillation  No Known Allergies  Patient Measurements: Height: 6' (182.9 cm) Weight: 168 lb 14 oz (76.6 kg) IBW/kg (Calculated) : 73.1   Vital Signs: Temp: 98.6 F (37 C) (04/28 0630) Temp Source: Oral (04/28 0630) BP: 130/69 mmHg (04/28 0630) Pulse Rate: 98 (04/28 0630)  Labs:  Recent Labs  11/29/15 1206 11/29/15 1349 11/29/15 1415 11/30/15 0423 11/30/15 1019  HGB 13.1 13.0  --   --   --   HCT 44.3 44.4  --   --   --   PLT 280 270  --   --   --   LABPROT  --   --  30.9*  --  40.2*  INR  --   --  3.03*  --  4.31*  CREATININE  --  0.97  --  0.84  --   TROPONINI  --  <0.03  --   --   --     Estimated Creatinine Clearance: 68.8 mL/min (by C-G formula based on Cr of 0.84).   Medical History: Past Medical History  Diagnosis Date  . Hypertension   . Arthritis   . Varicose veins   . Deaf   . Elevated WBC count     and platelets  . Thyroid disease   . Irregular heart rate   . 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 Peripheral neuropathy (HCC)     feet  . 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 Atrial fibrillation, chronic ([place])   . 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 Peripheral vascular disease ([place])   . Ulcer of ankle ([place]) 05/2014    left and right ankle  . Heart failure ([place])   . Pulmonary edema   . DVT (deep venous thrombosis) (HCC)     Medications:  Prescriptions prior to admission  Medication Sig Dispense Refill Last Dose  . aspirin EC 81 MG tablet Take 81 mg by mouth daily.   11/29/2015 at Unknown time  . benzonatate (TESSALON) 100 MG capsule    11/29/2015 at Unknown time  . CARTIA XT 240 MG 24 hr capsule Take 240 mg by mouth daily.    11/28/2015 at Unknown time  . Cholecalciferol (VITAMIN D3) 5000 UNITS CAPS Take 1 capsule by mouth daily.   11/29/2015 at Unknown time  . gabapentin (NEURONTIN) 100 MG capsule Take 100 mg by mouth 3 (three) times  daily.     11/29/2015 at Unknown time  . hydroxyurea (HYDREA) 500 MG capsule Take 500 mg PO BID Monday- Friday.  May take with food to minimize GI side effects. (Patient taking differently: Take 500 mg by mouth 2 (two) times daily. Monday- Friday ONLY.  May take with food to minimize GI side effects.) 40 capsule 5 11/29/2015 at Unknown time  . levothyroxine (SYNTHROID, LEVOTHROID) 75 MCG tablet Take 75 mcg by mouth daily before breakfast.    11/29/2015 at Unknown time  . metoprolol (LOPRESSOR) 100 MG tablet Take 100 mg by mouth 2 (two) times daily.   11/29/2015 at 0730  . Potassium Chloride ER 20 MEQ TBCR Take 20 mEq by mouth daily. 30 tablet 1 11/29/2015 at Unknown time  . torsemide (DEMADEX) 20 MG tablet Take 1 tablet (20 mg total) by mouth daily. 60 tablet 1 11/29/2015 at Unknown time  . warfarin (COUMADIN) 5 MG tablet TAKE AS DIRECTED BY  COUMADIN  CLINIC 30 tablet 0 11/29/2015 at 0730  . amoxicillin-clavulanate (AUGMENTIN) 875-125 MG tablet  Taking    Assessment: 75 yo lady to continue coumadin for afib.  INR today is elevated at 4.31.  No bleeding noted Goal of Therapy:  INR 2-3 Monitor platelets by anticoagulation protocol: Yes   Plan:  No coumadin today Daily PT/INR Monitor for bleeding complications  Sonnet Rizor Poteet 11/30/2015,11:55 AM

## 2015-11-30 NOTE — Plan of Care (Addendum)
Problem: Food- and Nutrition-Related Knowledge Deficit (NB-1.1) Goal: Nutrition education Formal process to instruct or train a patient/client in a skill or to impart knowledge to help patients/clients voluntarily manage or modify food choices and eating behavior to maintain or improve health. Outcome: Adequate for Discharge Nutrition Education Note  RD consulted for nutrition education regarding acute CHF.  RD provided "Low Sodium Nutrition Therapy" handout from the Academy of [employer]. Reviewed patient's dietary recall. Provided examples on ways to decrease sodium intake in diet. Discouraged intake of processed foods and use of salt shaker. Encouraged fresh fruits and vegetables as well as whole grain sources of carbohydrates to maximize fiber intake.   RD discussed why it is important for patient to adhere to diet recommendations, and emphasized the role of fluids, foods to avoid, and importance of weighing self daily. Teach back method used.  Expect good compliance.  Body mass index is 22.9 kg/(m^2). Pt meets criteria for normal range based on current BMI. Usual body weight reported around 175# per pt.  Current diet order is heart healthy, patient is consuming approximately 50% of meals at this time. Labs and medications reviewed. No further nutrition interventions warranted at this time. RD contact information provided. If additional nutrition issues arise, please re-consult RD.   Colman Cater MS,RD,CSG,LDN Office: [phone] Pager: [phone]

## 2015-12-01 DIAGNOSIS — I5031 Acute diastolic (congestive) heart failure: Secondary | ICD-10-CM

## 2015-12-01 LAB — BASIC METABOLIC PANEL
Anion gap: 9 (ref 5–15)
BUN: 17 mg/dL (ref 6–20)
CALCIUM: 8.4 mg/dL — AB (ref 8.9–10.3)
CHLORIDE: 102 mmol/L (ref 101–111)
CO2: 28 mmol/L (ref 22–32)
CREATININE: 0.86 mg/dL (ref 0.44–1.00)
Glucose, Bld: 111 mg/dL — ABNORMAL HIGH (ref 65–99)
Potassium: 3 mmol/L — ABNORMAL LOW (ref 3.5–5.1)
SODIUM: 139 mmol/L (ref 135–145)

## 2015-12-01 LAB — PROTIME-INR
INR: 2.84 — AB (ref 0.00–1.49)
PROTHROMBIN TIME: 29.3 s — AB (ref 11.6–15.2)

## 2015-12-01 LAB — GLUCOSE, CAPILLARY: GLUCOSE-CAPILLARY: 96 mg/dL (ref 65–99)

## 2015-12-01 LAB — MAGNESIUM: Magnesium: 1.6 mg/dL — ABNORMAL LOW (ref 1.7–2.4)

## 2015-12-01 MED ORDER — WARFARIN - PHARMACIST DOSING INPATIENT: Freq: Every day | Status: DC

## 2015-12-01 MED ORDER — POTASSIUM CHLORIDE CRYS ER 10 MEQ PO TBCR
20.0000 meq | EXTENDED_RELEASE_TABLET | Freq: Every day | ORAL | Status: DC
  Administered 2015-12-02 – 2015-12-03 (×2): 20 meq via ORAL
  Filled 2015-12-01 (×2): qty 2

## 2015-12-01 MED ORDER — POTASSIUM CHLORIDE CRYS ER 20 MEQ PO TBCR
40.0000 meq | EXTENDED_RELEASE_TABLET | Freq: Two times a day (BID) | ORAL | Status: AC
  Administered 2015-12-01 (×2): 40 meq via ORAL
  Filled 2015-12-01 (×2): qty 2

## 2015-12-01 MED ORDER — MAGNESIUM SULFATE 2 GM/50ML IV SOLN
2.0000 g | Freq: Once | INTRAVENOUS | Status: AC
  Administered 2015-12-01: 2 g via INTRAVENOUS
  Filled 2015-12-01: qty 50

## 2015-12-01 MED ORDER — SALINE SPRAY 0.65 % NA SOLN
1.0000 | NASAL | Status: DC | PRN
  Filled 2015-12-01: qty 44

## 2015-12-01 MED ORDER — WARFARIN SODIUM 5 MG PO TABS
5.0000 mg | ORAL_TABLET | Freq: Once | ORAL | Status: AC
Start: 2015-12-01 — End: 2015-12-01
  Administered 2015-12-01: 5 mg via ORAL
  Filled 2015-12-01: qty 1

## 2015-12-01 NOTE — Progress Notes (Signed)
PROGRESS NOTE    Charlotte [name]  [id number] DOB: Jan 01, 1941 DOA: 11/29/2015 PCP: [name], MD  Outpatient Specialists:     Brief Narrative:  75 y.o. female with history of DVT, PVD, ankle ulcer, chron afib, polycythemia vera, neuropathy, deafness, DJD and HTN. Hx of diast CHF and PNA this January, treated here at [facility]. Patient was seen by Hem-Onc today in clinic for f/u of PV, on Hydrea. Pt noted worsening SOB, and low O2 levels at home despite home O2. Sent to ED for evaluation. In ED SaO2's are high 80's to low 90's on 6L [place] O2. Pt reports SOB for 2-3 days. She has had chills and some prod cough, was seen by PCP yesterday and received " a shot" and Rx for augmentin. SOB worse today. No chest pain, no abd pain, n/v/d. No voiding problems. Takes torsemide at home. 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 Cough / SOB / hypoxemia -  1. in patient w known diastolic CHF.  2. No fever/ WBC slightly up.  3. Likely acute/ chron diast HF,  4. Patient has been continued with IV lasix and empirically IV abx for CAP.  5. Unlikely PE with INR 3.0 on coumadin.  6. Cardiology was consulted and appreciate input 7. Continue to wean o2 as tolerated 2. Polycythemia vera -  1. on hydrea 3. Chron afib  1. on coumadin 4. Hypothyroid 1. Cont thyroid replacement 5. Pulm HTN /diast dysfunction -  1. 2d echo done 4/28, normal EF, unable to assess diastolic function 6. HTN 1. Cont on diltiazem/ BB   DVT prophylaxis: coumadin Code Status: Full Family Communication: Pt in room, husband at  bedside Disposition Plan: Unclear at this point   Consultants:   Cardiology  Procedures:  2d echo 4/28  Antimicrobials:   Rocephin 4/28>>>  azithro 4/28>>>    Subjective: Patients states she is breathing better today. Husband agrees pt seems much improved since admission.  Objective: Filed Vitals:   11/30/15 1349 11/30/15 1812 11/30/15 2144 12/01/15 0505  BP: 111/58  137/78 141/74  Pulse: 66  54 74  Temp: 98.4 F (36.9 C)  97.8 F (36.6 C) 97.8 F (36.6 C)  TempSrc: Oral  Oral Oral  Resp: 20  20 20   Height:      Weight:    76.25 kg (168 lb 1.6 oz)  SpO2: 93% 93% 92% 95%    Intake/Output Summary (Last 24 hours) at 12/01/15 1522 Last data filed at 12/01/15 1131  Gross per 24 hour  Intake    120 ml  Output    450 ml  Net   -330 ml   Filed Weights   11/29/15 2056 11/30/15 0630 12/01/15 0505  Weight: 77.6 kg (171 lb 1.2 oz) 76.6 kg (168 lb 14 oz) 76.25 kg (168 lb 1.6 oz)    Examination:  General exam: Appears calm and comfortable, laying in bed Respiratory system: Clear to auscultation. Respiratory effort normal. Cardiovascular system: S1 & S2 heard, RRR.Marland Kitchen Gastrointestinal system: Abdomen is nondistended, soft and nontender. No organomegaly or masses felt. Normal bowel sounds heard.  Central nervous system: Alert and oriented. No focal neurological deficits. Extremities: Symmetric 5 x 5 power. Skin: No rashes, lesions Psychiatry: Judgement and insight appear normal. Mood & affect appropriate.     Data Reviewed: I have personally reviewed following labs and imaging studies  CBC:  Recent Labs Lab 11/29/15 1206 11/29/15 1349  WBC 12.8* 12.1*  NEUTROABS 11.0*  --   HGB 13.1 13.0  HCT 44.3 44.4  MCV 81.7 81.6  PLT 280 [id number]   Basic Metabolic Panel:  Recent Labs Lab 11/29/15 1349 11/30/15 0423 12/01/15 0608  NA 138 140 139  K 3.5 2.8* 3.0*  CL 101 101 102  CO2 27 28 28   GLUCOSE 91 93 111*  BUN 21* 16 17  CREATININE 0.97 0.84 0.86  CALCIUM  8.5* 8.2* 8.4*  MG  --   --  1.6*   GFR: Estimated Creatinine Clearance: 67.2 mL/min (by C-G formula based on Cr of 0.86). Liver Function Tests:  Recent Labs Lab 11/29/15 1349  AST 20  ALT 15  ALKPHOS 66  BILITOT 1.9*  PROT 6.5  ALBUMIN 3.4*   No results for input(s): LIPASE, AMYLASE in the last 168 hours. No results for input(s): AMMONIA in the last 168 hours. Coagulation Profile:  Recent Labs Lab 11/29/15 1415 11/30/15 1019 12/01/15 0608  INR 3.03* 4.31* 2.84*   Cardiac Enzymes:  Recent Labs Lab 11/29/15 1349  TROPONINI <0.03   BNP (last 3 results) No results for input(s): PROBNP in the last 8760 hours. HbA1C: No results for input(s): HGBA1C in the last 72 hours. CBG: No results for input(s): GLUCAP in the last 168 hours. Lipid Profile: No results for input(s): CHOL, HDL, LDLCALC, TRIG, CHOLHDL, LDLDIRECT in the last 72 hours. Thyroid Function Tests: No results for input(s): TSH, T4TOTAL, FREET4, T3FREE, THYROIDAB in the last 72 hours. Anemia Panel: No results for input(s): VITAMINB12, FOLATE, FERRITIN, TIBC, IRON, RETICCTPCT in the last 72 hours. Urine analysis:    Component Value Date/Time   COLORURINE YELLOW 09/14/2012 2337   APPEARANCEUR CLEAR 09/14/2012 2337   LABSPEC 1.020 09/14/2012 2337   PHURINE 6.5 09/14/2012 2337   GLUCOSEU NEGATIVE 09/14/2012 2337   HGBUR NEGATIVE 09/14/2012 2337   BILIRUBINUR NEGATIVE 09/14/2012 2337   KETONESUR NEGATIVE 09/14/2012 2337   PROTEINUR NEGATIVE 09/14/2012 2337   UROBILINOGEN 1.0 09/14/2012 2337   NITRITE NEGATIVE 09/14/2012 2337   LEUKOCYTESUR NEGATIVE 09/14/2012 2337   Sepsis Labs: @LABRCNTIP (procalcitonin:4,lacticidven:4)  )No results found for this or any previous visit (from the past 240 hour(s)).       Radiology Studies: No results found.      Scheduled Meds: . antiseptic oral rinse  7 mL Mouth Rinse BID  . aspirin EC  81 mg Oral Daily  . azithromycin  250 mg Intravenous Q24H  .  cefTRIAXone (ROCEPHIN)  IV  1 g Intravenous Q24H  . diltiazem  120 mg Oral Daily  . feeding supplement (ENSURE ENLIVE)  237 mL Oral BID BM  . gabapentin  100 mg Oral TID  . hydroxyurea  500 mg Oral BID  . levothyroxine  75 mcg Oral QAC breakfast  . lisinopril  10 mg Oral Daily  . metoprolol  100 mg Oral BID  . [START ON 12/02/2015] potassium chloride  20 mEq Oral Daily  . potassium chloride  40 mEq Oral BID  . sodium chloride flush  3 mL Intravenous Q12H  . torsemide  40 mg Oral Daily  . warfarin  5 mg Oral Once  . Warfarin -  Pharmacist Dosing Inpatient   Does not apply q1800   Continuous Infusions:    LOS: 2 days    [name], Charlotte Melter, MD [facility] Pager [phone]  If 7PM-7AM, please contact night-coverage [email or web address] Password [email or web address] 12/01/2015, 3:22 PM

## 2015-12-01 NOTE — Progress Notes (Signed)
ANTICOAGULATION CONSULT NOTE   Pharmacy Consult for coumadin Indication: atrial fibrillation  No Known Allergies  Patient Measurements: Height: 6' (182.9 cm) Weight: 168 lb 1.6 oz (76.25 kg) IBW/kg (Calculated) : 73.1   Vital Signs: Temp: 97.8 F (36.6 C) (04/29 0505) Temp Source: Oral (04/29 0505) BP: 141/74 mmHg (04/29 0505) Pulse Rate: 74 (04/29 0505)  Labs:  Recent Labs  11/29/15 1206 11/29/15 1349 11/29/15 1415 11/30/15 0423 11/30/15 1019 12/01/15 0608  HGB 13.1 13.0  --   --   --   --   HCT 44.3 44.4  --   --   --   --   PLT 280 270  --   --   --   --   LABPROT  --   --  30.9*  --  40.2* 29.3*  INR  --   --  3.03*  --  4.31* 2.84*  CREATININE  --  0.97  --  0.84  --  0.86  TROPONINI  --  <0.03  --   --   --   --     Estimated Creatinine Clearance: 67.2 mL/min (by C-G formula based on Cr of 0.86).   Medical History: Past Medical History  Diagnosis Date  . Hypertension   . Arthritis   . Varicose veins   . Deaf   . Elevated WBC count     and platelets  . Thyroid disease   . Irregular heart rate   . 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 Peripheral neuropathy (HCC)     feet  . 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 Atrial fibrillation, chronic ([place])   . 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 Peripheral vascular disease ([place])   . Ulcer of ankle ([place]) 05/2014    left and right ankle  . Heart failure ([place])   . Pulmonary edema   . DVT (deep venous thrombosis) (HCC)     Medications:  Prescriptions prior to admission  Medication Sig Dispense Refill Last Dose  . aspirin EC 81 MG tablet Take 81 mg by mouth daily.   11/29/2015 at Unknown time  . benzonatate (TESSALON) 100 MG capsule    11/29/2015 at Unknown time  . CARTIA XT 240 MG 24 hr capsule Take 240 mg by mouth daily.    11/28/2015 at Unknown time  . Cholecalciferol (VITAMIN D3) 5000 UNITS CAPS Take 1 capsule by mouth daily.   11/29/2015 at Unknown time  . gabapentin (NEURONTIN) 100 MG capsule  Take 100 mg by mouth 3 (three) times daily.     11/29/2015 at Unknown time  . hydroxyurea (HYDREA) 500 MG capsule Take 500 mg PO BID Monday- Friday.  May take with food to minimize GI side effects. (Patient taking differently: Take 500 mg by mouth 2 (two) times daily. Monday- Friday ONLY.  May take with food to minimize GI side effects.) 40 capsule 5 11/29/2015 at Unknown time  . levothyroxine (SYNTHROID, LEVOTHROID) 75 MCG tablet Take 75 mcg by mouth daily before breakfast.    11/29/2015 at Unknown time  . metoprolol (LOPRESSOR) 100 MG tablet Take 100 mg by mouth 2 (two) times daily.   11/29/2015 at 0730  . Potassium Chloride ER 20 MEQ TBCR Take 20 mEq by mouth daily. 30 tablet 1 11/29/2015 at Unknown time  . torsemide (DEMADEX) 20 MG tablet Take 1 tablet (20 mg total) by mouth daily. 60 tablet 1 11/29/2015 at Unknown time  . warfarin (COUMADIN) 5 MG tablet TAKE AS DIRECTED BY  COUMADIN  CLINIC 30 tablet 0 11/29/2015 at 0730  . amoxicillin-clavulanate (AUGMENTIN) 875-125 MG tablet    Taking    Assessment: 75 yo lady to continue coumadin for afib.  INR today is therapeutic.  No bleeding noted Goal of Therapy:  INR 2-3 Monitor platelets by anticoagulation protocol: Yes   Plan:  Coumadin 5 mg po today Daily PT/INR Monitor for bleeding complications  Charlotte [name] 12/01/2015,8:37 AM

## 2015-12-02 LAB — BASIC METABOLIC PANEL
Anion gap: 8 (ref 5–15)
BUN: 16 mg/dL (ref 6–20)
CALCIUM: 8.4 mg/dL — AB (ref 8.9–10.3)
CO2: 30 mmol/L (ref 22–32)
CREATININE: 0.87 mg/dL (ref 0.44–1.00)
Chloride: 103 mmol/L (ref 101–111)
GFR calc Af Amer: >60 mL/min (ref >60)
GLUCOSE: 89 mg/dL (ref 65–99)
Potassium: 3.4 mmol/L — ABNORMAL LOW (ref 3.5–5.1)
Sodium: 141 mmol/L (ref 135–145)

## 2015-12-02 LAB — PROTIME-INR
INR: 2.73 — ABNORMAL HIGH (ref 0.00–1.49)
Prothrombin Time: 28.5 seconds — ABNORMAL HIGH (ref 11.6–15.2)

## 2015-12-02 MED ORDER — AZITHROMYCIN 250 MG PO TABS
250.0000 mg | ORAL_TABLET | ORAL | Status: DC
  Administered 2015-12-02: 250 mg via ORAL
  Filled 2015-12-02: qty 1

## 2015-12-02 MED ORDER — WARFARIN SODIUM 5 MG PO TABS
5.0000 mg | ORAL_TABLET | Freq: Once | ORAL | Status: AC
  Administered 2015-12-02: 5 mg via ORAL
  Filled 2015-12-02: qty 1

## 2015-12-02 NOTE — Progress Notes (Signed)
[facility] for coumadin Indication: atrial fibrillation  No Known Allergies  Patient Measurements: Height: 6' (182.9 cm) Weight: 168 lb 3.4 oz (76.3 kg) IBW/kg (Calculated) : 73.1   Vital Signs: Temp: 97.7 F (36.5 C) (04/30 0533) Temp Source: Oral (04/30 0533) BP: 151/89 mmHg (04/30 0533) Pulse Rate: 78 (04/30 0533)  Labs:  Recent Labs  11/29/15 1206  11/29/15 1349  11/30/15 0423 11/30/15 1019 12/01/15 0608 12/02/15 0452  HGB 13.1  --  13.0  --   --   --   --   --   HCT 44.3  --  44.4  --   --   --   --   --   PLT 280  --  270  --   --   --   --   --   LABPROT  --   --   --   < >  --  40.2* 29.3* 28.5*  INR  --   --   --   < >  --  4.31* 2.84* 2.73*  CREATININE  --   < > 0.97  --  0.84  --  0.86 0.87  TROPONINI  --   --  <0.03  --   --   --   --   --   < > = values in this interval not displayed.  Estimated Creatinine Clearance: 66.5 mL/min (by C-G formula based on Cr of 0.87).   Medical History: Past Medical History  Diagnosis Date  . Hypertension   . Arthritis   . Varicose veins   . Deaf   . Elevated WBC count     and platelets  . Thyroid disease   . Irregular heart rate   . 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 Peripheral neuropathy (HCC)     feet  . 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 Atrial fibrillation, chronic ([place])   . 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 Peripheral vascular disease ([place])   . Ulcer of ankle ([place]) 05/2014    left and right ankle  . Heart failure ([place])   . Pulmonary edema   . DVT (deep venous thrombosis) (HCC)     Medications:  Prescriptions prior to admission  Medication Sig Dispense Refill Last Dose  . aspirin EC 81 MG tablet Take 81 mg by mouth daily.   11/29/2015 at Unknown time  . benzonatate (TESSALON) 100 MG capsule    11/29/2015 at Unknown time  . CARTIA XT 240 MG 24 hr capsule Take 240 mg by mouth daily.    11/28/2015 at Unknown time  . Cholecalciferol (VITAMIN D3)  5000 UNITS CAPS Take 1 capsule by mouth daily.   11/29/2015 at Unknown time  . gabapentin (NEURONTIN) 100 MG capsule Take 100 mg by mouth 3 (three) times daily.     11/29/2015 at Unknown time  . hydroxyurea (HYDREA) 500 MG capsule Take 500 mg PO BID Monday- Friday.  May take with food to minimize GI side effects. (Patient taking differently: Take 500 mg by mouth 2 (two) times daily. Monday- Friday ONLY.  May take with food to minimize GI side effects.) 40 capsule 5 11/29/2015 at Unknown time  . levothyroxine (SYNTHROID, LEVOTHROID) 75 MCG tablet Take 75 mcg by mouth daily before breakfast.    11/29/2015 at Unknown time  . metoprolol (LOPRESSOR) 100 MG tablet Take 100 mg by mouth 2 (two) times daily.   11/29/2015 at 0730  . Potassium Chloride ER 20 MEQ  TBCR Take 20 mEq by mouth daily. 30 tablet 1 11/29/2015 at Unknown time  . torsemide (DEMADEX) 20 MG tablet Take 1 tablet (20 mg total) by mouth daily. 60 tablet 1 11/29/2015 at Unknown time  . warfarin (COUMADIN) 5 MG tablet TAKE AS DIRECTED BY  COUMADIN  CLINIC 30 tablet 0 11/29/2015 at 0730  . amoxicillin-clavulanate (AUGMENTIN) 875-125 MG tablet    Taking    Assessment: 75 yo lady to continue coumadin for afib.  INR today is therapeutic.  No bleeding noted Goal of Therapy:  INR 2-3 Monitor platelets by anticoagulation protocol: Yes   Plan:  Coumadin 5 mg po today Daily PT/INR Monitor for bleeding complications  Teri Diltz Poteet 12/02/2015,8:02 AM

## 2015-12-02 NOTE — Progress Notes (Signed)
PROGRESS NOTE    ARLINGTON RILE  [id number] DOB: 03/17/1941 DOA: 11/29/2015 PCP: [name], MD  Outpatient Specialists:     Brief Narrative:  75 y.o. female with history of DVT, PVD, ankle ulcer, chron afib, polycythemia vera, neuropathy, deafness, DJD and HTN. Hx of diast CHF and PNA this January, treated here at [facility]. Patient was seen by Hem-Onc today in clinic for f/u of PV, on Hydrea. Pt noted worsening SOB, and low O2 levels at home despite home O2. Sent to ED for evaluation. In ED SaO2's are high 80's to low 90's on 6L [place] O2. Pt reports SOB for 2-3 days. She has had chills and some prod cough, was seen by PCP yesterday and received " a shot" and Rx for augmentin. SOB worse today. No chest pain, no abd pain, n/v/d. No voiding problems. Takes torsemide at home. Patient was admitted for work up of hypoxia   Assessment & Plan:   Principal Problem:   Acute on chronic diastolic (congestive) heart failure (HCC) Active Problems:   Polycythemia vera (HCC)   Chronic atrial fibrillation (HCC)   HTN (hypertension)   Dyspnea   Cough   Hypoxemia   Community acquired pneumonia   CHF (congestive heart failure) ([place])   Acute on chronic respiratory failure with hypoxia ([place])  1. Cough / SOB / hypoxemia likely secondary to acute on chronic diastolic chf with CAP  1. Patient w/ known diastolic CHF.  2. Patient has been continued empirically IV abx for CAP.  3. Unlikely PE with INR 3.0 on coumadin.  4. Cardiology was consulted and is following 5. Continue to wean o2 as tolerated, down to 3L today 6. I/o not accurately obtained overnight 7. As pt is showing clinical improvement, will cont PO diuretic for now 2. Polycythemia vera -  1. on hydrea 3. Chron afib  1. on coumadin 4. Hypothyroid 1. Cont thyroid replacement 5. Pulm HTN /diast dysfunction -  1. 2d echo done 4/28, normal EF, unable to assess diastolic function 6. HTN 1. Cont on diltiazem/ BB   DVT  prophylaxis: coumadin Code Status: Full Family Communication: Pt in room, husband at bedside Disposition Plan: Unclear at this point   Consultants:   Cardiology  Procedures:  2d echo 4/28  Antimicrobials:   Rocephin 4/28>>>  azithro 4/28>>>    Subjective: States breathing better today  Objective: Filed Vitals:   12/01/15 2203 12/02/15 0533 12/02/15 0800 12/02/15 0859  BP: 153/96 151/89    Pulse: 73 78    Temp: 97.7 F (36.5 C) 97.7 F (36.5 C)    TempSrc: Oral Oral    Resp: 20 20    Height:      Weight:  76.3 kg (168 lb 3.4 oz)    SpO2: 95% 95% 95% 92%   No intake or output data in the 24 hours ending 12/02/15 1434 Filed Weights   11/30/15 0630 12/01/15 0505 12/02/15 0533  Weight: 76.6 kg (168 lb 14 oz) 76.25 kg (168 lb 1.6 oz) 76.3 kg (168 lb 3.4 oz)    Examination:  General exam: Appears calm and comfortable, laying in bed Respiratory system: Clear to auscultation. Respiratory effort normal, no wheezing Cardiovascular system: S1 & S2 heard, RRR.Marland Kitchen Gastrointestinal system: Abdomen is nondistended, soft and nontender. No organomegaly or masses felt. Normal bowel sounds heard. Central nervous system: Alert and oriented. No focal neurological deficits. Extremities: Symmetric 5 x 5 power, no clubbing Skin: No rashes, lesions Psychiatry: Judgement and insight appear normal. Mood &  affect appropriate.     Data Reviewed: I have personally reviewed following labs and imaging studies  CBC:  Recent Labs Lab 11/29/15 1206 11/29/15 1349  WBC 12.8* 12.1*  NEUTROABS 11.0*  --   HGB 13.1 13.0  HCT 44.3 44.4  MCV 81.7 81.6  PLT 280 [id number]   Basic Metabolic Panel:  Recent Labs Lab 11/29/15 1349 11/30/15 0423 12/01/15 0608 12/02/15 0452  NA 138 140 139 141  K 3.5 2.8* 3.0* 3.4*  CL 101 101 102 103  CO2 27 28 28 30   GLUCOSE 91 93 111* 89  BUN 21* 16 17 16   CREATININE 0.97 0.84 0.86 0.87  CALCIUM 8.5* 8.2* 8.4* 8.4*  MG  --   --  1.6*  --     GFR: Estimated Creatinine Clearance: 66.5 mL/min (by C-G formula based on Cr of 0.87). Liver Function Tests:  Recent Labs Lab 11/29/15 1349  AST 20  ALT 15  ALKPHOS 66  BILITOT 1.9*  PROT 6.5  ALBUMIN 3.4*   No results for input(s): LIPASE, AMYLASE in the last 168 hours. No results for input(s): AMMONIA in the last 168 hours. Coagulation Profile:  Recent Labs Lab 11/29/15 1415 11/30/15 1019 12/01/15 0608 12/02/15 0452  INR 3.03* 4.31* 2.84* 2.73*   Cardiac Enzymes:  Recent Labs Lab 11/29/15 1349  TROPONINI <0.03   BNP (last 3 results) No results for input(s): PROBNP in the last 8760 hours. HbA1C: No results for input(s): HGBA1C in the last 72 hours. CBG:  Recent Labs Lab 12/01/15 2013  GLUCAP 96   Lipid Profile: No results for input(s): CHOL, HDL, LDLCALC, TRIG, CHOLHDL, LDLDIRECT in the last 72 hours. Thyroid Function Tests: No results for input(s): TSH, T4TOTAL, FREET4, T3FREE, THYROIDAB in the last 72 hours. Anemia Panel: No results for input(s): VITAMINB12, FOLATE, FERRITIN, TIBC, IRON, RETICCTPCT in the last 72 hours. Urine analysis:    Component Value Date/Time   COLORURINE YELLOW 09/14/2012 2337   APPEARANCEUR CLEAR 09/14/2012 2337   LABSPEC 1.020 09/14/2012 2337   PHURINE 6.5 09/14/2012 2337   GLUCOSEU NEGATIVE 09/14/2012 2337   HGBUR NEGATIVE 09/14/2012 2337   BILIRUBINUR NEGATIVE 09/14/2012 2337   KETONESUR NEGATIVE 09/14/2012 2337   PROTEINUR NEGATIVE 09/14/2012 2337   UROBILINOGEN 1.0 09/14/2012 2337   NITRITE NEGATIVE 09/14/2012 2337   LEUKOCYTESUR NEGATIVE 09/14/2012 2337   Sepsis Labs: @LABRCNTIP (procalcitonin:4,lacticidven:4)  )No results found for this or any previous visit (from the past 240 hour(s)).       Radiology Studies: No results found.      Scheduled Meds: . antiseptic oral rinse  7 mL Mouth Rinse BID  . aspirin EC  81 mg Oral Daily  . azithromycin  250 mg Oral Q24H  . cefTRIAXone (ROCEPHIN)  IV  1 g  Intravenous Q24H  . diltiazem  120 mg Oral Daily  . feeding supplement (ENSURE ENLIVE)  237 mL Oral BID BM  . gabapentin  100 mg Oral TID  . hydroxyurea  500 mg Oral BID  . levothyroxine  75 mcg Oral QAC breakfast  . lisinopril  10 mg Oral Daily  . metoprolol  100 mg Oral BID  . potassium chloride  20 mEq Oral Daily  . sodium chloride flush  3 mL Intravenous Q12H  . torsemide  40 mg Oral Daily  . warfarin  5 mg Oral Once  . Warfarin - Pharmacist Dosing Inpatient   Does not apply q1800   Continuous Infusions:    LOS: 3 days    Devarius Nelles,  Orpah Melter, MD [facility] Pager [phone]  If 7PM-7AM, please contact night-coverage [email or web address] Password [facility] 12/02/2015, 2:34 PM

## 2015-12-03 ENCOUNTER — Encounter (HOSPITAL_COMMUNITY): Payer: Self-pay | Admitting: Cardiology

## 2015-12-03 LAB — BASIC METABOLIC PANEL
ANION GAP: 10 (ref 5–15)
BUN: 15 mg/dL (ref 6–20)
CALCIUM: 8.6 mg/dL — AB (ref 8.9–10.3)
CO2: 30 mmol/L (ref 22–32)
Chloride: 102 mmol/L (ref 101–111)
Creatinine, Ser: 0.75 mg/dL (ref 0.44–1.00)
GFR calc Af Amer: >60 mL/min (ref >60)
GFR calc non Af Amer: >60 mL/min (ref >60)
GLUCOSE: 88 mg/dL (ref 65–99)
Potassium: 3.5 mmol/L (ref 3.5–5.1)
Sodium: 142 mmol/L (ref 135–145)

## 2015-12-03 LAB — PROTIME-INR
INR: 2.84 — ABNORMAL HIGH (ref 0.00–1.49)
PROTHROMBIN TIME: 29.4 s — AB (ref 11.6–15.2)

## 2015-12-03 MED ORDER — LISINOPRIL 10 MG PO TABS: 10.0000 mg | ORAL_TABLET | Freq: Every day | ORAL | Status: DC

## 2015-12-03 MED ORDER — SALINE SPRAY 0.65 % NA SOLN: 1.0000 | NASAL | Status: DC | PRN

## 2015-12-03 MED ORDER — TORSEMIDE 20 MG PO TABS: 40.0000 mg | ORAL_TABLET | Freq: Every day | ORAL | Status: DC

## 2015-12-03 MED ORDER — AZITHROMYCIN 250 MG PO TABS: ORAL_TABLET | ORAL | Status: DC

## 2015-12-03 MED ORDER — WARFARIN SODIUM 5 MG PO TABS: 5.0000 mg | ORAL_TABLET | Freq: Once | ORAL | Status: DC

## 2015-12-03 MED ORDER — CEFUROXIME AXETIL 250 MG PO TABS: 250.0000 mg | ORAL_TABLET | Freq: Two times a day (BID) | ORAL | Status: DC

## 2015-12-03 NOTE — Progress Notes (Signed)
Discharged PT per MD order and protocol. Discharge handouts reviewed/explained. Education completed.  Pt verbalized understanding and left with all belongings. O2 ambulation test completed and patient will continue oxygen at home. Oxygen education completed. VSS. IV catheter D/C.  Patient wheeled down by staff member.

## 2015-12-03 NOTE — Care Management Important Message (Signed)
Important Message  Patient Details  Name: KEVIANNA KLEPINGER MRN: [id number] Date of Birth: [birth year]   Medicare Important Message Given:  Yes    Sherald Barge, RN 12/03/2015, 11:20 AM

## 2015-12-03 NOTE — Progress Notes (Signed)
Primary cardiologist: Dr. Kate Sable  Seen for followup: Diastolic heart failure  Subjective:    No chest pain or shortness of breath at rest. No leg edema. Reports some sinus congestion and intermittent cough.  Objective:   Temp:  [97.5 F (36.4 C)-97.8 F (36.6 C)] 97.8 F (36.6 C) (05/01 0429) Pulse Rate:  [52-88] 88 (05/01 0429) Resp:  [20] 20 (05/01 0429) BP: (122-155)/(73-88) 155/88 mmHg (05/01 0429) SpO2:  [92 %-96 %] 92 % (05/01 0429) Weight:  [165 lb 5.5 oz (75 kg)] 165 lb 5.5 oz (75 kg) (05/01 0429) Last BM Date: 11/30/15  Filed Weights   12/01/15 0505 12/02/15 0533 12/03/15 0429  Weight: 168 lb 1.6 oz (76.25 kg) 168 lb 3.4 oz (76.3 kg) 165 lb 5.5 oz (75 kg)    Intake/Output Summary (Last 24 hours) at 12/03/15 0930 Last data filed at 12/03/15 0800  Gross per 24 hour  Intake    120 ml  Output      0 ml  Net    120 ml    Telemetry: Atrial fibrillation.  Exam:  General: Elderly woman in no distress.  Lungs: Decreased breath sounds at the bases consistent with effusions.  Cardiac: Irregularly irregular.  Abdomen: NABS.  Extremities: No pitting edema.  Lab Results:  Basic Metabolic Panel:  Recent Labs Lab 12/01/15 0608 12/02/15 0452 12/03/15 0456  NA 139 141 142  K 3.0* 3.4* 3.5  CL 102 103 102  CO2 28 30 30   GLUCOSE 111* 89 88  BUN 17 16 15   CREATININE 0.86 0.87 0.75  CALCIUM 8.4* 8.4* 8.6*  MG 1.6*  --   --     Liver Function Tests:  Recent Labs Lab 11/29/15 1349  AST 20  ALT 15  ALKPHOS 66  BILITOT 1.9*  PROT 6.5  ALBUMIN 3.4*    CBC:  Recent Labs Lab 11/29/15 1206 11/29/15 1349  WBC 12.8* 12.1*  HGB 13.1 13.0  HCT 44.3 44.4  MCV 81.7 81.6  PLT 280 270    Cardiac Enzymes:  Recent Labs Lab 11/29/15 1349  TROPONINI <0.03    Coagulation:  Recent Labs Lab 12/01/15 0608 12/02/15 0452 12/03/15 0456  INR 2.84* 2.73* 2.84*    Echocardiogram 11/22/2015: Study Conclusions  - Left ventricle:  The cavity size was normal. Wall thickness was  increased in a pattern of mild LVH. Systolic function was normal.  The estimated ejection fraction was in the range of 60% to 65%.  Wall motion was normal; there were no regional wall motion  abnormalities. The study was not technically sufficient to allow  evaluation of LV diastolic dysfunction due to atrial  fibrillation. - Mitral valve: Calcified annulus. There was mild regurgitation. - Left atrium: The atrium was severely dilated. - Right ventricle: Systolic function was mildly reduced. - Right atrium: The atrium was severely dilated. - Tricuspid valve: There was mild regurgitation. - Pulmonary arteries: Systolic pressure was mildly to moderately  increased. PA peak pressure: 48 mm Hg (S). - Systemic veins: IVC dilated with normal respiratory variation.  Estimated CVP 8 mmHg.   Medications:   Scheduled Medications: . antiseptic oral rinse  7 mL Mouth Rinse BID  . aspirin EC  81 mg Oral Daily  . azithromycin  250 mg Oral Q24H  . cefTRIAXone (ROCEPHIN)  IV  1 g Intravenous Q24H  . diltiazem  120 mg Oral Daily  . feeding supplement (ENSURE ENLIVE)  237 mL Oral BID BM  . gabapentin  100 mg  Oral TID  . hydroxyurea  500 mg Oral BID  . levothyroxine  75 mcg Oral QAC breakfast  . lisinopril  10 mg Oral Daily  . metoprolol  100 mg Oral BID  . potassium chloride  20 mEq Oral Daily  . sodium chloride flush  3 mL Intravenous Q12H  . torsemide  40 mg Oral Daily  . Warfarin - Pharmacist Dosing Inpatient   Does not apply q1800     PRN Medications:  sodium chloride, acetaminophen, ondansetron (ZOFRAN) IV, sodium chloride, sodium chloride flush   Assessment:   1. Acute on chronic diastolic heart failure. Overall diuresis not well documented, but weight is down between 5 and 10 pounds since admission. She is currently on Demadex at 40 mg daily which is higher than prior outpatient dose.  2. Chronic atrial fibrillation. She  continues on Coumadin for stroke prophylaxis and diltiazem CD well as metoprolol for heart rate control.  3. Hypoxic respiratory failure, requiring supplemental oxygen. Not entirely clear that diastolic heart failure is entirely to blame, suspect underlying chronic lung disease as well. Would have her ambulate today to reassess pulse oximetry on baseline oxygen requirement. May need follow-up chest x-ray to reassess effusions if hypoxemia persists. Might also want to consider outpatient PFTs if not done recently.  Plan/Discussion:    Discussed with Dr. Wyline Copas. Continue current regimen including Cardizem CD, metoprolol, and Coumadin. INR therapeutic at 2.8. She continues on Demadex 40 mg daily, no change in dose for now. If remains hypoxic with ambulation on supplemental oxygen, consider follow-up chest x-ray and conversion to IV diuretic if effusions persist to significant degree. Otherwise may want to workup for possible chronic lung disease with PFTs.  Satira Sark, M.D., F.A.C.C.

## 2015-12-03 NOTE — Care Management Note (Signed)
Case Management Note  Patient Details  Name: Charlotte [name] MRN: [id number] Date of Birth: [birth year]  Expected Discharge Date:  12/02/15               Expected Discharge Plan:  Home/Self Care  In-House Referral:  NA  Discharge planning Services  CM Consult  Post Acute Care Choice:  NA Choice offered to:  NA  DME Arranged:    DME Agency:     HH Arranged:    [place] Agency:     Status of Service:  Completed, signed off  Medicare Important Message Given:  Yes Date Medicare IM Given:    Medicare IM give by:    Date Additional Medicare IM Given:    Additional Medicare Important Message give by:     If discussed at Home of Stay Meetings, dates discussed:    Additional Comments: Pt is discharging home today with self care. Pt's husband is bringing port O2 tank for transport home. No CM needs.   Sherald Barge, RN 12/03/2015, 11:20 AM

## 2015-12-03 NOTE — Discharge Summary (Signed)
Physician Discharge Summary  Charlotte [name] [id number] DOB: 1940-11-29 DOA: 11/29/2015  PCP: [name], MD  Admit date: 11/29/2015 Discharge date: 12/03/2015  Time spent: 20 minutes  Recommendations for Outpatient Follow-up:  1. Follow up with PCP in 2-3 weeks 2. Consider outpatient PFT's to work up chronic lung disease  Discharge Diagnoses:  Principal Problem:   Acute on chronic diastolic (congestive) heart failure (HCC) Active Problems:   Polycythemia vera (HCC)   Chronic atrial fibrillation (HCC)   HTN (hypertension)   Dyspnea   Cough   Hypoxemia   Community acquired pneumonia   CHF (congestive heart failure) ([place])   Acute on chronic respiratory failure with hypoxia [facility])   Discharge Condition: Improved  Diet recommendation: Heart healthy  Filed Weights   12/01/15 0505 12/02/15 0533 12/03/15 0429  Weight: 76.25 kg (168 lb 1.6 oz) 76.3 kg (168 lb 3.4 oz) 75 kg (165 lb 5.5 oz)    History of present illness:  Please review dictated H and P from 4/27 for details. Briefly, 75 y.o. female with history of DVT, PVD, ankle ulcer, chron afib, polycythemia vera, neuropathy, deafness, DJD and HTN. Hx of diast CHF and PNA this January, treated here at [facility]. Patient was seen by Hem-Onc today in clinic for f/u of PV, on Hydrea. Pt noted worsening SOB, and low O2 levels at home despite home O2. Sent to ED for evaluation. In ED SaO2's are high 80's to low 90's on 6L [place] O2. Pt reports SOB for 2-3 days. She has had chills and some prod cough, was seen by PCP yesterday and received " a shot" and Rx for augmentin. SOB worse today. No chest pain, no abd pain, n/v/d. No voiding problems. Takes torsemide at home. Patient was admitted for work up of hypoxia  Hospital Course:  1. Cough / SOB / hypoxemia likely secondary to acute on chronic diastolic chf with CAP  1. Patient w/ known diastolic CHF.  2. Patient was continued empirically on IV abx for CAP.  3. Unlikely PE  with INR 3.0 on coumadin.  4. Cardiology was consulted and is following 5. Weaned o2 to baseline 3L 6. Per Cardiology, plan to continue on increased dose of torsemide on discharge 2. Polycythemia vera -  1. Continued on hydrea 3. Chron afib  1. on coumadin 4. Hypothyroid 1. Cont thyroid replacement 5. Pulm HTN /diast dysfunction -  1. 2d echo done 4/28, normal EF, unable to assess diastolic function 6. HTN 1. Cont on diltiazem/ BB  Consultations:  Cardiology  Discharge Exam: Filed Vitals:   12/02/15 0859 12/02/15 1440 12/02/15 2032 12/03/15 0429  BP:  122/77 142/73 155/88  Pulse:  78 52 88  Temp:  97.5 F (36.4 C) 97.8 F (36.6 C) 97.8 F (36.6 C)  TempSrc:  Oral Oral Oral  Resp:  20 20 20   Height:      Weight:    75 kg (165 lb 5.5 oz)  SpO2: 92% 92% 96% 92%    General: Awake, in nad Cardiovascular: regular, s1, s2 Respiratory: normal resp effort, no wheezing  Discharge Instructions     Medication List    STOP taking these medications        amoxicillin-clavulanate 875-125 MG tablet  Commonly known as:  AUGMENTIN      TAKE these medications        aspirin EC 81 MG tablet  Take 81 mg by mouth daily.     azithromycin 250 MG tablet  Commonly known as:  ZITHROMAX  1 tab po qday x 3 more days starting on 5/2, zero refills     benzonatate 100 MG capsule  Commonly known as:  TESSALON     CARTIA XT 240 MG 24 hr capsule  Generic drug:  diltiazem  Take 240 mg by mouth daily.     cefUROXime 250 MG tablet  Commonly known as:  CEFTIN  Take 1 tablet (250 mg total) by mouth 2 (two) times daily with a meal.     gabapentin 100 MG capsule  Commonly known as:  NEURONTIN  Take 100 mg by mouth 3 (three) times daily.     hydroxyurea 500 MG capsule  Commonly known as:  HYDREA  Take 500 mg PO BID Monday- Friday.  May take with food to minimize GI side effects.     levothyroxine 75 MCG tablet  Commonly known as:  SYNTHROID, LEVOTHROID  Take 75 mcg by mouth  daily before breakfast.     lisinopril 10 MG tablet  Commonly known as:  PRINIVIL,ZESTRIL  Take 1 tablet (10 mg total) by mouth daily.     metoprolol 100 MG tablet  Commonly known as:  LOPRESSOR  Take 100 mg by mouth 2 (two) times daily.     Potassium Chloride ER 20 MEQ Tbcr  Take 20 mEq by mouth daily.     sodium chloride 0.65 % Soln nasal spray  Commonly known as:  OCEAN  Place 1 spray into both nostrils as needed for congestion.     torsemide 20 MG tablet  Commonly known as:  DEMADEX  Take 2 tablets (40 mg total) by mouth daily.     Vitamin D3 5000 units Caps  Take 1 capsule by mouth daily.     warfarin 5 MG tablet  Commonly known as:  COUMADIN  TAKE AS DIRECTED BY  COUMADIN  CLINIC       No Known Allergies Follow-up Information    Follow up with Earleen Newport, NP On 12/11/2015.   Specialty:  Psychiatry   Why:  at 10:30 am   Contact information:   [address] [place] [facility] [address] [phone]        The results of significant diagnostics from this hospitalization (including imaging, microbiology, ancillary and laboratory) are listed below for reference.    Significant Diagnostic Studies: Dg Chest 2 View  11/29/2015  CLINICAL DATA:  Productive cough, congestion since Monday. Increased sob/hypoxia x 2 days. Oxygen upped to 6 liters from 4 constantly. EXAM: CHEST  2 VIEW COMPARISON:  08/09/2015 FINDINGS: There are moderate left and small right pleural effusions. The left has decreased in size from prior x-ray. Right is stable. There is associated basilar lung opacity, greater on the left, most likely atelectasis. There are prominent bronchovascular markings but no overt pulmonary edema. No No pneumothorax. Cardiac silhouette is mildly enlarged. No mediastinal or hilar masses or evidence of adenopathy. Bony thorax is demineralized but grossly intact. IMPRESSION: 1. No acute cardiopulmonary disease. 2. Moderate left and small right pleural effusions with associated  lung base atelectasis. No convincing pulmonary edema. Left pleural effusion is smaller than it was on the prior chest radiographs. Electronically Signed   By: Lajean Manes M.D.   On: 11/29/2015 14:30    Microbiology: No results found for this or any previous visit (from the past 240 hour(s)).   Labs: Basic Metabolic Panel:  Recent Labs Lab 11/29/15 1349 11/30/15 0423 12/01/15 0608 12/02/15 0452 12/03/15 0456  NA 138 140 139 141 142  K 3.5 2.8* 3.0* 3.4* 3.5  CL 101 101 102 103 102  CO2 27 28 28 30 30   GLUCOSE 91 93 111* 89 88  BUN 21* 16 17 16 15   CREATININE 0.97 0.84 0.86 0.87 0.75  CALCIUM 8.5* 8.2* 8.4* 8.4* 8.6*  MG  --   --  1.6*  --   --    Liver Function Tests:  Recent Labs Lab 11/29/15 1349  AST 20  ALT 15  ALKPHOS 66  BILITOT 1.9*  PROT 6.5  ALBUMIN 3.4*   No results for input(s): LIPASE, AMYLASE in the last 168 hours. No results for input(s): AMMONIA in the last 168 hours. CBC:  Recent Labs Lab 11/29/15 1206 11/29/15 1349  WBC 12.8* 12.1*  NEUTROABS 11.0*  --   HGB 13.1 13.0  HCT 44.3 44.4  MCV 81.7 81.6  PLT 280 270   Cardiac Enzymes:  Recent Labs Lab 11/29/15 1349  TROPONINI <0.03   BNP: BNP (last 3 results)  Recent Labs  11/21/15 1439 11/29/15 1349  BNP 815.0* 816.0*    ProBNP (last 3 results) No results for input(s): PROBNP in the last 8760 hours.  CBG:  Recent Labs Lab 12/01/15 2013  GLUCAP 96   Signed:  CHIU, STEPHEN K  [facility] 12/03/2015, 5:30 PM

## 2015-12-03 NOTE — Progress Notes (Signed)
SATURATION QUALIFICATIONS: (This note is used to comply with regulatory documentation for home oxygen)  Patient Saturations on Room Air at Rest = 88%  Patient Saturations on Room Air while Ambulating = 85%  Patient Saturations on 3 Liters of oxygen while Ambulating = 93%

## 2015-12-03 NOTE — Progress Notes (Signed)
ANTICOAGULATION CONSULT NOTE   Pharmacy Consult for coumadin Indication: atrial fibrillation  No Known Allergies  Patient Measurements: Height: 6' (182.9 cm) Weight: 165 lb 5.5 oz (75 kg) IBW/kg (Calculated) : 73.1   Vital Signs: Temp: 97.8 F (36.6 C) (05/01 0429) Temp Source: Oral (05/01 0429) BP: 155/88 mmHg (05/01 0429) Pulse Rate: 88 (05/01 0429)  Labs:  Recent Labs  12/01/15 0608 12/02/15 0452 12/03/15 0456  LABPROT 29.3* 28.5* 29.4*  INR 2.84* 2.73* 2.84*  CREATININE 0.86 0.87 0.75    Estimated Creatinine Clearance: 72.3 mL/min (by C-G formula based on Cr of 0.75).   Medical History: Past Medical History  Diagnosis Date  . Essential hypertension   . Arthritis   . Varicose veins   . Deaf   . Peripheral neuropathy ([place])   . 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 Atrial fibrillation, chronic ([place])   . Peripheral vascular disease ([place])   . Ulcer of ankle ([place])     Bilateral 2015  . DVT (deep venous thrombosis) ([place])   . Hypothyroidism   . Diastolic heart failure (HCC)     Medications:  Prescriptions prior to admission  Medication Sig Dispense Refill Last Dose  . aspirin EC 81 MG tablet Take 81 mg by mouth daily.   11/29/2015 at Unknown time  . benzonatate (TESSALON) 100 MG capsule    11/29/2015 at Unknown time  . CARTIA XT 240 MG 24 hr capsule Take 240 mg by mouth daily.    11/28/2015 at Unknown time  . Cholecalciferol (VITAMIN D3) 5000 UNITS CAPS Take 1 capsule by mouth daily.   11/29/2015 at Unknown time  . gabapentin (NEURONTIN) 100 MG capsule Take 100 mg by mouth 3 (three) times daily.     11/29/2015 at Unknown time  . hydroxyurea (HYDREA) 500 MG capsule Take 500 mg PO BID Monday- Friday.  May take with food to minimize GI side effects. (Patient taking differently: Take 500 mg by mouth 2 (two) times daily. Monday- Friday ONLY.  May take with food to minimize GI side effects.) 40 capsule 5 11/29/2015 at Unknown time  . levothyroxine (SYNTHROID, LEVOTHROID)  75 MCG tablet Take 75 mcg by mouth daily before breakfast.    11/29/2015 at Unknown time  . metoprolol (LOPRESSOR) 100 MG tablet Take 100 mg by mouth 2 (two) times daily.   11/29/2015 at 0730  . Potassium Chloride ER 20 MEQ TBCR Take 20 mEq by mouth daily. 30 tablet 1 11/29/2015 at Unknown time  . torsemide (DEMADEX) 20 MG tablet Take 1 tablet (20 mg total) by mouth daily. 60 tablet 1 11/29/2015 at Unknown time  . warfarin (COUMADIN) 5 MG tablet TAKE AS DIRECTED BY  COUMADIN  CLINIC 30 tablet 0 11/29/2015 at 0730  . amoxicillin-clavulanate (AUGMENTIN) 875-125 MG tablet    Taking    Assessment: 75 yo lady to continue coumadin for afib.  INR today is therapeutic.  No bleeding noted. Per anticoagulation clinic patient takes 5mg  on MWF, and 2.5mg  on other days. Last visit on 11/14/2015.   Goal of Therapy:  INR 2-3 Monitor platelets by anticoagulation protocol: Yes   Plan:  Coumadin 5 mg po today Daily PT/INR Monitor for bleeding complications  Isac Sarna, BS [place]ena [place], BCPS Clinical Pharmacist Pager [phone] 12/03/2015,10:42 AM

## 2015-12-05 ENCOUNTER — Ambulatory Visit: Payer: Medicare Other | Admitting: Physician Assistant

## 2015-12-10 DIAGNOSIS — I509 Heart failure, unspecified: Secondary | ICD-10-CM | POA: Diagnosis not present

## 2015-12-10 DIAGNOSIS — E039 Hypothyroidism, unspecified: Secondary | ICD-10-CM | POA: Diagnosis not present

## 2015-12-10 DIAGNOSIS — I10 Essential (primary) hypertension: Secondary | ICD-10-CM | POA: Diagnosis not present

## 2015-12-10 DIAGNOSIS — Z6822 Body mass index (BMI) 22.0-22.9, adult: Secondary | ICD-10-CM | POA: Diagnosis not present

## 2015-12-20 ENCOUNTER — Encounter (HOSPITAL_COMMUNITY): Payer: Medicare Other | Attending: Hematology and Oncology | Admitting: Hematology & Oncology

## 2015-12-20 ENCOUNTER — Encounter (HOSPITAL_COMMUNITY): Payer: Medicare Other

## 2015-12-20 ENCOUNTER — Encounter (HOSPITAL_COMMUNITY): Payer: Self-pay | Admitting: Hematology & Oncology

## 2015-12-20 VITALS — BP 105/72 | HR 63 | Temp 98.3°F | Resp 20 | Wt 162.8 lb

## 2015-12-20 DIAGNOSIS — D45 Polycythemia vera: Secondary | ICD-10-CM | POA: Diagnosis not present

## 2015-12-20 DIAGNOSIS — I4891 Unspecified atrial fibrillation: Secondary | ICD-10-CM | POA: Diagnosis not present

## 2015-12-20 DIAGNOSIS — C946 Myelodysplastic disease, not classified: Secondary | ICD-10-CM

## 2015-12-20 LAB — CBC WITH DIFFERENTIAL/PLATELET
Basophils Absolute: 0.5 10*3/uL — ABNORMAL HIGH (ref 0.0–0.1)
Basophils Relative: 2 %
EOS ABS: 0.3 10*3/uL (ref 0.0–0.7)
EOS PCT: 2 %
HEMATOCRIT: 45.5 % (ref 36.0–46.0)
Hemoglobin: 13.3 g/dL (ref 12.0–15.0)
LYMPHS ABS: 1.5 10*3/uL (ref 0.7–4.0)
LYMPHS PCT: 7 %
MCH: 24.4 pg — AB (ref 26.0–34.0)
MCHC: 29.2 g/dL — AB (ref 30.0–36.0)
MCV: 83.6 fL (ref 78.0–100.0)
MONO ABS: 0.5 10*3/uL (ref 0.1–1.0)
Monocytes Relative: 2 %
Neutro Abs: 18.2 10*3/uL — ABNORMAL HIGH (ref 1.7–7.7)
Neutrophils Relative %: 87 %
Platelets: 394 10*3/uL (ref 150–400)
RBC: 5.44 MIL/uL — ABNORMAL HIGH (ref 3.87–5.11)
RDW: 24.2 % — AB (ref 11.5–15.5)
WBC: 21 10*3/uL — ABNORMAL HIGH (ref 4.0–10.5)

## 2015-12-20 NOTE — Patient Instructions (Addendum)
[place] at [facility] Discharge Instructions  RECOMMENDATIONS MADE BY THE CONSULTANT AND ANY TEST RESULTS WILL BE SENT TO YOUR REFERRING PHYSICIAN  Exam and discussion with Dr. Whitney Muse. Lab work in 4 weeks. Office visit in 8 weeks with Tom.   Thank you for choosing [place] at [facility] to provide your oncology and hematology care.  To afford each patient quality time with our provider, please arrive at least 15 minutes before your scheduled appointment time.   Beginning January 23rd 2017 lab work for the [facility] will be done in the  Main lab at [facility] on 1st floor. If you have a lab appointment with the [facility] please come in thru the  Main Entrance and check in at the main information desk  You need to re-schedule your appointment should you arrive 10 or more minutes late.  We strive to give you quality time with our providers, and arriving late affects you and other patients whose appointments are after yours.  Also, if you no show three or more times for appointments you may be dismissed from the clinic at the providers discretion.     Again, thank you for choosing [facility].  Our hope is that these requests will decrease the amount of time that you wait before being seen by our physicians.       _____________________________________________________________  Should you have questions after your visit to [facility], please contact our office at [phone] between the hours of 8:30 a.m. and 4:30 p.m.  Voicemails left after 4:30 p.m. will not be returned until the following business day.  For prescription refill requests, have your pharmacy contact our office.         Resources For Cancer Patients and their Caregivers ? American Cancer Society: Can assist with transportation, wigs, general needs, runs Look Good Feel Better.        514-479-5743 ? Cancer Care: Provides financial  assistance, online support groups, medication/co-pay assistance.  1-800-813-HOPE 519-306-0031) ? [place] Assists [place] cancer patients and their families through emotional , educational and financial support.  (206)888-7008 ? [place] DSS Where to apply for food stamps, Medicaid and utility assistance. 505-400-3000 ? [facility]: Transportation to medical appointments. 774-670-0955 ? Social Security Administration: May apply for disability if have a Stage IV cancer. (786)385-1709 269-748-6649 ? [employer], Disability and Transit Services: Assists with nutrition, care and transit needs. [place] Support Programs: @10RELATIVEDAYS @ > Cancer Support Group  2nd Tuesday of the month 1pm-2pm, Journey Room  > Creative Journey  3rd Tuesday of the month 1130am-1pm, Journey Room  > Look Good Feel Better  1st Wednesday of the month 10am-12 noon, Journey Room (Call [place] to register [phone])

## 2015-12-20 NOTE — Progress Notes (Signed)
Charlotte Kilts, MD [place] [id number]    DIAGNOSIS: JAK 2 positive MPD, P. Vera   CURRENT THERAPY: Hydrea  INTERVAL HISTORY: Charlotte [name] 75 y.o. 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   At our last visit she was taken to the ED with hypoxemia. She was admitted and noted to have CHF.   Charlotte [name] is accompanied by her husband.  She continues to take Hydrea. She is smiling today. No O2. She notes she is comfortable and feeling well.  At times she feels short of breath, but this is managed with intermittent oxygen at home. Her strength is improving. She is eating well. She will follow with [facility] cardiology next week.  She denies nausea, vomiting, no headaches, blurry vision.   MEDICAL HISTORY: Past Medical History  Diagnosis Date  . Essential hypertension   . Arthritis   . Varicose veins   . Deaf   . Peripheral neuropathy ([place])   . 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 Atrial fibrillation, chronic ([place])   . Peripheral vascular disease ([place])   . Ulcer of ankle ([place])     Bilateral 2015  . DVT (deep venous thrombosis) ([place])   . Hypothyroidism   . 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     has No Known Allergies.  Current Outpatient Prescriptions on File Prior to Visit  Medication Sig Dispense Refill  . aspirin EC 81 MG tablet Take 81 mg by mouth daily.    Marland Kitchen CARTIA XT 240 MG 24 hr capsule Take 240 mg by mouth daily.     . Cholecalciferol (VITAMIN D3) 5000 UNITS CAPS Take 1 capsule by mouth daily.    Marland Kitchen gabapentin (NEURONTIN) 100 MG capsule Take 100 mg by mouth 3 (three) times daily.      . hydroxyurea (HYDREA) 500 MG capsule Take 500 mg PO BID Monday- Friday.  May take with food to minimize GI side effects. (Patient taking differently: Take 500 mg by mouth 2 (two) times daily. Monday- Friday ONLY.  May take with food to minimize GI side effects.) 40 capsule 5  . levothyroxine (SYNTHROID, LEVOTHROID) 75 MCG tablet Take 75 mcg by mouth daily before breakfast.     . lisinopril (PRINIVIL,ZESTRIL) 10 MG tablet Take 1 tablet (10 mg total) by mouth daily. 30 tablet 0  . metoprolol (LOPRESSOR) 100 MG tablet Take 100 mg by mouth 2 (two) times daily.    . Potassium Chloride ER 20 MEQ TBCR Take 20 mEq by mouth daily. 30 tablet 1  . sodium chloride (OCEAN) 0.65 % SOLN nasal spray Place 1 spray into both nostrils as needed for congestion. 1 Bottle 0  . torsemide (DEMADEX) 20 MG tablet Take  2 tablets (40 mg total) by mouth daily. 30 tablet 0  . warfarin (COUMADIN) 5 MG tablet TAKE AS DIRECTED BY  COUMADIN  CLINIC 30 tablet 0  . benzonatate (TESSALON) 100 MG capsule Reported on 12/20/2015     No current facility-administered medications on file prior to visit.     SURGICAL HISTORY: Past Surgical History  Procedure Laterality Date  . Tubal ligation    . Abdominal hysterectomy    . Coronary angioplasty    . Hip pinning,cannulated  11/19/2011     Procedure: CANNULATED HIP PINNING;  Surgeon: Sanjuana Kava, MD;  Location: [facility];  Service: Orthopedics;  Laterality: Right;  . Fasciotomy  11/29/2011    Procedure: FASCIOTOMY;  Surgeon: Carole Civil, MD;  Location: [facility];  Service: Orthopedics;  Laterality: Right;  right thigh   . Dressing change under anesthesia  12/02/2011    Procedure: DRESSING CHANGE UNDER ANESTHESIA;  Surgeon: Carole Civil, MD;  Location: [facility];  Service: Orthopedics;  Laterality: Right;  . Endarterectomy femoral Right 09/15/2012    Procedure: ENDARTERECTOMY FEMORAL;  Surgeon: Mal Misty, MD;  Location: [place];  Service: Vascular;  Laterality: Right;  . Femoral-popliteal bypass graft Right 09/15/2012    Procedure: BYPASS GRAFT FEMORAL-POPLITEAL ARTERY;  Surgeon: Mal Misty, MD;  Location: [place];  Service: Vascular;  Laterality: Right;  . Patch angioplasty Right 09/15/2012    Procedure: PATCH ANGIOPLASTY;  Surgeon: Mal Misty, MD;  Location: [place];  Service: Vascular;  Laterality: Right;  . Abdominal aortagram N/A 09/14/2012    Procedure: ABDOMINAL [name];  Surgeon: Serafina Mitchell, MD;  Location[facility] C[facility] LAB;  Service: Cardiovascular;  Laterality: N/A;    SOCIAL HISTORY: Social History   Social History  . Marital Status: Married    Spouse Name: N/A  . Number of Children: N/A  . Years of Education: N/A   Occupational History  . Not on file.   Social History Main Topics  . Smoking status: Never Smoker   . Smokeless tobacco: Never Used  . Alcohol Use: No  . Drug Use: No  . Sexual Activity: No   Other Topics Concern  . Not on file   Social History Narrative    FAMILY HISTORY: Family History  Problem Relation Age of Onset  . Diabetes Son   . Heart failure Mother   . Stroke Father   . Hypertension Sister   . Hypertension Sister   . Stroke Sister     Review of Systems  Constitutional: Positive for malaise/fatigue. Negative for fever, chills and weight loss.  HENT:  Negative  for congestion, hearing loss, nosebleeds, sore throat and tinnitus.    Eyes: Negative for blurred vision, double vision, pain and discharge.  Respiratory: Positive for shortness of breath. Negative for cough, hemoptysis, sputum production and wheezing.   SOB managed with O2 at home. Cardiovascular: Negative for chest pain, palpitations, claudication, leg swelling and PND.  Gastrointestinal: Negative for heartburn, nausea, vomiting, abdominal pain, diarrhea, constipation, blood in stool and melena.  Genitourinary: Negative for dysuria, urgency, frequency and hematuria.  Musculoskeletal: Negative for myalgias, joint pain and falls.  Skin: Negative for itching and rash.  Neurological: Positive for weakness, improving. Negative for dizziness, tingling, tremors, sensory change, speech change, focal weakness, seizures, loss of consciousness and headaches.  Endo/Heme/Allergies: Does not bruise/bleed easily.  Psychiatric/Behavioral: Negative for depression, suicidal ideas, memory loss and substance abuse. The patient is not nervous/anxious and does not have insomnia.  PHYSICAL EXAMINATION  ECOG PERFORMANCE STATUS: 1 - Symptomatic but completely ambulatory  Filed Vitals:   12/20/15 1329  BP: 105/72  Pulse: 63  Temp: 98.3 F (36.8 C)  Resp: 20    Physical Exam  Constitutional: She is oriented to person, place, and time and well-developed, not in distress. HENT:  Head: Normocephalic and atraumatic.  Nose: Nose normal.  Mouth/Throat:  No oropharyngeal exudate.  Eyes: Conjunctivae and EOM are normal. Pupils are equal, round, and reactive to light. Right eye exhibits no discharge. Left eye exhibits no discharge. No scleral icterus.  Neck: Normal range of motion. Neck supple. No tracheal deviation present. No thyromegaly present.  Cardiovascular: Normal rate, regular rhythm and normal heart sounds.  Exam reveals no gallop and no friction rub.   No murmur heard. Pulmonary/Chest: Clear today     Abdominal: Soft. Bowel sounds are normal. She exhibits no distension and no mass. There is no tenderness. There is no rebound and no guarding.  Musculoskeletal: Normal range of motion. She exhibits no edema.  Lymphadenopathy:    She has no cervical adenopathy.  Neurological: She is alert and oriented to person, place, and time. She has normal reflexes. No cranial nerve deficit In wheelchair Skin: Skin is warm and dry. No rash noted.   Nursing note and vitals reviewed.   LABORATORY DATA: I have reviewed the data as listed.  CBC    Component Value Date/Time   WBC 21.0* 12/20/2015 1246   RBC 5.44* 12/20/2015 1246   RBC 3.48* 08/19/2013 0828   HGB 13.3 12/20/2015 1246   HCT 45.5 12/20/2015 1246   PLT 394 12/20/2015 1246   MCV 83.6 12/20/2015 1246   MCH 24.4* 12/20/2015 1246   MCHC 29.2* 12/20/2015 1246   RDW 24.2* 12/20/2015 1246   LYMPHSABS 1.5 12/20/2015 1246   MONOABS 0.5 12/20/2015 1246   EOSABS 0.3 12/20/2015 1246   BASOSABS 0.5* 12/20/2015 1246   CMP     Component Value Date/Time   NA 142 12/03/2015 0456   K 3.5 12/03/2015 0456   CL 102 12/03/2015 0456   CO2 30 12/03/2015 0456   GLUCOSE 88 12/03/2015 0456   BUN 15 12/03/2015 0456   CREATININE 0.75 12/03/2015 0456   CREATININE 1.07 12/04/2014 1248   CALCIUM 8.6* 12/03/2015 0456   PROT 6.5 11/29/2015 1349   ALBUMIN 3.4* 11/29/2015 1349   AST 20 11/29/2015 1349   ALT 15 11/29/2015 1349   ALKPHOS 66 11/29/2015 1349   BILITOT 1.9* 11/29/2015 1349   GFRNONAA >60 12/03/2015 0456   GFRAA >60 12/03/2015 0456    ASSESSMENT and THERAPY PLAN:  JAK 2 positive MPD Atrial Fibrillation Anticoagulation with coumadin Hypoxemia, CHF   She has continued on her Hydrea. She is clinically doing well today.  Ideally goal is for HCT < 45. I am not going to phlebotomize her given her recent cardiac issues, instead I will repeat a CBC in a few weeks and if HCT is still mildly elevated will consider phlebotomy at that  time.  The patient will return for labs in 1 month and follow up in 2 months.   Orders Placed This Encounter  Procedures  . CBC with Differential    Standing Status: Future     Number of Occurrences:      Standing Expiration Date: 12/19/2016    All questions were answered. The patient knows to call the clinic with any problems, questions or concerns. We can certainly see the patient much sooner if necessary.  This note was electronically signed.  This document serves as a record of services personally performed by Ancil Linsey, MD. It was created on her behalf by Arlyce Harman, a trained medical scribe. The creation of this record is based on the scribe's personal observations and the provider's statements to them. This document has been checked and approved by the attending provider.  I have reviewed the above documentation for accuracy and completeness, and I agree with the above.  [place] Fam. Orrin Yurkovich MD

## 2015-12-21 ENCOUNTER — Encounter (HOSPITAL_COMMUNITY): Payer: Self-pay | Admitting: Hematology & Oncology

## 2015-12-24 ENCOUNTER — Other Ambulatory Visit (HOSPITAL_COMMUNITY)
Admission: RE | Admit: 2015-12-24 | Discharge: 2015-12-24 | Disposition: A | Discharge status: Home / Self Care | Payer: Medicare Other | Source: Ambulatory Visit | Attending: Adult Health | Admitting: Adult Health

## 2015-12-24 ENCOUNTER — Ambulatory Visit (INDEPENDENT_AMBULATORY_CARE_PROVIDER_SITE_OTHER): Payer: Medicare Other | Admitting: *Deleted

## 2015-12-24 ENCOUNTER — Other Ambulatory Visit (HOSPITAL_COMMUNITY): Payer: Self-pay | Admitting: Oncology

## 2015-12-24 ENCOUNTER — Ambulatory Visit (INDEPENDENT_AMBULATORY_CARE_PROVIDER_SITE_OTHER): Payer: Medicare Other | Admitting: Adult Health

## 2015-12-24 ENCOUNTER — Encounter: Payer: Self-pay | Admitting: Adult Health

## 2015-12-24 ENCOUNTER — Other Ambulatory Visit: Payer: Self-pay | Admitting: Cardiovascular Disease

## 2015-12-24 VITALS — BP 112/62 | HR 84 | Ht 72.0 in | Wt 162.0 lb

## 2015-12-24 DIAGNOSIS — Z5181 Encounter for therapeutic drug level monitoring: Secondary | ICD-10-CM | POA: Diagnosis not present

## 2015-12-24 DIAGNOSIS — I4891 Unspecified atrial fibrillation: Secondary | ICD-10-CM

## 2015-12-24 DIAGNOSIS — I10 Essential (primary) hypertension: Secondary | ICD-10-CM | POA: Diagnosis not present

## 2015-12-24 DIAGNOSIS — Z79899 Other long term (current) drug therapy: Secondary | ICD-10-CM | POA: Diagnosis not present

## 2015-12-24 DIAGNOSIS — I482 Chronic atrial fibrillation, unspecified: Secondary | ICD-10-CM

## 2015-12-24 DIAGNOSIS — I5032 Chronic diastolic (congestive) heart failure: Secondary | ICD-10-CM

## 2015-12-24 LAB — BASIC METABOLIC PANEL
ANION GAP: 8 (ref 5–15)
BUN: 18 mg/dL (ref 6–20)
CHLORIDE: 101 mmol/L (ref 101–111)
CO2: 27 mmol/L (ref 22–32)
Calcium: 8.8 mg/dL — ABNORMAL LOW (ref 8.9–10.3)
Creatinine, Ser: 1.01 mg/dL — ABNORMAL HIGH (ref 0.44–1.00)
GFR calc Af Amer: >60 mL/min (ref >60)
GFR, EST NON AFRICAN AMERICAN: 54 mL/min — AB (ref >60)
Glucose, Bld: 91 mg/dL (ref 65–99)
POTASSIUM: 3.7 mmol/L (ref 3.5–5.1)
SODIUM: 136 mmol/L (ref 135–145)

## 2015-12-24 LAB — POCT INR: INR: 3.3

## 2015-12-24 MED ORDER — LISINOPRIL 10 MG PO TABS: 10.0000 mg | ORAL_TABLET | Freq: Every day | ORAL | Status: DC

## 2015-12-24 MED ORDER — TORSEMIDE 20 MG PO TABS: 40.0000 mg | ORAL_TABLET | Freq: Every day | ORAL | Status: DC

## 2015-12-24 NOTE — Progress Notes (Signed)
Name: Charlotte [name]    DOB: Aug 17, 1940  Age: 75 y.o.  MR#: [id number]       PCP:  [name], MD      Insurance: Payor: MEDICARE / Plan: MEDICARE PART A AND B / Product Type: *No Product type* /   CC:   No chief complaint on file.   VS Filed Vitals:   12/24/15 1257  BP: 112/62  Pulse: 84  Height: 6' (1.829 m)  Weight: 162 lb (73.483 kg)  SpO2: 91%    Weights Current Weight  12/24/15 162 lb (73.483 kg)  12/20/15 162 lb 12.8 oz (73.846 kg)  12/03/15 165 lb 5.5 oz (75 kg)    Blood Pressure  BP Readings from Last 3 Encounters:  12/24/15 112/62  12/20/15 105/72  12/03/15 155/88     Admit date:  (Not on file) Last encounter with RMR:  Visit date not found   Allergy Review of patient's allergies indicates no known allergies.  Current Outpatient Prescriptions  Medication Sig Dispense Refill  . aspirin EC 81 MG tablet Take 81 mg by mouth daily.    . benzonatate (TESSALON) 100 MG capsule Reported on 12/20/2015    . CARTIA XT 240 MG 24 hr capsule Take 240 mg by mouth daily.     . Cholecalciferol (VITAMIN D3) 5000 UNITS CAPS Take 1 capsule by mouth daily.    Marland Kitchen gabapentin (NEURONTIN) 100 MG capsule Take 100 mg by mouth 3 (three) times daily.      . hydroxyurea (HYDREA) 500 MG capsule Take 500 mg PO BID Monday- Friday.  May take with food to minimize GI side effects. (Patient taking differently: Take 500 mg by mouth 2 (two) times daily. Monday- Friday ONLY.  May take with food to minimize GI side effects.) 40 capsule 5  . levothyroxine (SYNTHROID, LEVOTHROID) 75 MCG tablet Take 75 mcg by mouth daily before breakfast.     . lisinopril (PRINIVIL,ZESTRIL) 10 MG tablet Take 1 tablet (10 mg total) by mouth daily. 30 tablet 0  . metoprolol (LOPRESSOR) 100 MG tablet Take 100 mg by mouth 2 (two) times daily.    . Potassium Chloride ER 20 MEQ TBCR Take 20 mEq by mouth daily. 30 tablet 1  . sodium chloride (OCEAN) 0.65 % SOLN nasal spray Place 1 spray into both nostrils as needed for  congestion. 1 Bottle 0  . torsemide (DEMADEX) 20 MG tablet Take 2 tablets (40 mg total) by mouth daily. 30 tablet 0  . warfarin (COUMADIN) 5 MG tablet TAKE AS DIRECTED BY  COUMADIN  CLINIC 30 tablet 0   No current facility-administered medications for this visit.    Discontinued Meds:   There are no discontinued medications.  Patient Active Problem List   Diagnosis Date Noted  . Cough 11/29/2015  . Hypoxemia 11/29/2015  . Community acquired pneumonia 11/29/2015  . CHF (congestive heart failure) ([place]) 11/29/2015  . Acute on chronic respiratory failure with hypoxia ([place])   . Pleural effusion on left   . Acute on chronic diastolic (congestive) heart failure ([place])   . Dyspnea 11/22/2014  . Pleural effusion 11/22/2014  . Hard of hearing 11/22/2014  . Thrombocytosis ([place]) 11/22/2014  . Encounter for therapeutic drug monitoring 08/31/2013  . Aftercare following surgery of the circulatory system, [place] 12/21/2012  . Pain in limb 10/19/2012  . Peripheral vascular disease ([place]) 09/24/2012  . Swollen leg 09/24/2012  . Varicose veins of lower extremities with other complications [id number]  . 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 Arterial occlusion due to stenosis ([place]) 08/20/2012  . Wound infection after surgery 01/07/2012  . Compartment syndrome, nontraumatic, lower extremity 11/29/2011  . Pain of right thigh 11/28/2011  . Hip fracture, right ([place]) 11/15/2011  . Chronic atrial fibrillation ([place]) 11/15/2011  . Anticoagulant long-term use 11/15/2011  . HTN (hypertension) 11/15/2011  . Hypothyroidism 11/15/2011  . 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/01/2011    LABS    Component Value Date/Time   NA 142 12/03/2015 0456   NA 141 12/02/2015 0452   NA 139 12/01/2015 0608   K 3.5 12/03/2015 0456   K 3.4* 12/02/2015 0452   K 3.0* 12/01/2015 0608   CL 102 12/03/2015 0456   CL 103 12/02/2015 0452   CL 102 12/01/2015 0608   CO2 30 12/03/2015 0456   CO2 30  12/02/2015 0452   CO2 28 12/01/2015 0608   GLUCOSE 88 12/03/2015 0456   GLUCOSE 89 12/02/2015 0452   GLUCOSE 111* 12/01/2015 0608   BUN 15 12/03/2015 0456   BUN 16 12/02/2015 0452   BUN 17 12/01/2015 0608   CREATININE 0.75 12/03/2015 0456   CREATININE 0.87 12/02/2015 0452   CREATININE 0.86 12/01/2015 0608   CREATININE 1.07 12/04/2014 1248   CALCIUM 8.6* 12/03/2015 0456   CALCIUM 8.4* 12/02/2015 0452   CALCIUM 8.4* 12/01/2015 0608   GFRNONAA >60 12/03/2015 0456   GFRNONAA >60 12/02/2015 0452   GFRNONAA >60 12/01/2015 0608   GFRAA >60 12/03/2015 0456   GFRAA >60 12/02/2015 0452   GFRAA >60 12/01/2015 0608   CMP     Component Value Date/Time   NA 142 12/03/2015 0456   K 3.5 12/03/2015 0456   CL 102 12/03/2015 0456   CO2 30 12/03/2015 0456   GLUCOSE 88 12/03/2015 0456   BUN 15 12/03/2015 0456   CREATININE 0.75 12/03/2015 0456   CREATININE 1.07 12/04/2014 1248   CALCIUM 8.6* 12/03/2015 0456   PROT 6.5 11/29/2015 1349   ALBUMIN 3.4* 11/29/2015 1349   AST 20 11/29/2015 1349   ALT 15 11/29/2015 1349   ALKPHOS 66 11/29/2015 1349   BILITOT 1.9* 11/29/2015 1349   GFRNONAA >60 12/03/2015 0456   GFRAA >60 12/03/2015 0456       Component Value Date/Time   WBC 21.0* 12/20/2015 1246   WBC 12.1* 11/29/2015 1349   WBC 12.8* 11/29/2015 1206   HGB 13.3 12/20/2015 1246   HGB 13.0 11/29/2015 1349   HGB 13.1 11/29/2015 1206   HCT 45.5 12/20/2015 1246   HCT 44.4 11/29/2015 1349   HCT 44.3 11/29/2015 1206   MCV 83.6 12/20/2015 1246   MCV 81.6 11/29/2015 1349   MCV 81.7 11/29/2015 1206    Lipid Panel     Component Value Date/Time   CHOL  07/19/2009 0448    119        ATP III CLASSIFICATION:  <200     mg/dL   Desirable  200-239  mg/dL   Borderline High  >=240    mg/dL   High          TRIG 124 07/19/2009 0448   HDL 32* 07/19/2009 0448   CHOLHDL 3.7 07/19/2009 0448   VLDL 25 07/19/2009 0448   LDLCALC  07/19/2009 0448    62        Total Cholesterol/HDL:CHD Risk Coronary  Heart Disease Risk Table                     Men  Women  1/2 Average Risk   3.4   3.3  Average Risk       5.0   4.4  2 X Average Risk   9.6   7.1  3 X Average Risk  23.4   11.0        Use the calculated Patient Ratio above and the CHD Risk Table to determine the patient's CHD Risk.        ATP III CLASSIFICATION (LDL):  <100     mg/dL   Optimal  100-129  mg/dL   Near or Above                    Optimal  130-159  mg/dL   Borderline  160-189  mg/dL   High  >190     mg/dL   Very High    ABG    Component Value Date/Time   PHART 7.474* 11/27/2014 1215   PCO2ART 32.5* 11/27/2014 1215   PO2ART 49.6* 11/27/2014 1215   HCO3 23.6 11/27/2014 1215   TCO2 20.3 11/27/2014 1215   O2SAT 84.8 11/27/2014 1215     Lab Results  Component Value Date   TSH 1.984 11/22/2014   BNP (last 3 results)  Recent Labs  11/21/15 1439 11/29/15 1349  BNP 815.0* 816.0*    ProBNP (last 3 results) No results for input(s): PROBNP in the last 8760 hours.  Cardiac Panel (last 3 results) No results for input(s): CKTOTAL, CKMB, TROPONINI, RELINDX in the last 72 hours.  Iron/TIBC/Ferritin/ %Sat    Component Value Date/Time   IRON 47 11/09/2014 1046   TIBC 349 11/09/2014 1046   FERRITIN 23 12/21/2014 1104   IRONPCTSAT 13* 11/09/2014 1046     EKG Orders placed or performed during the hospital encounter of 11/29/15  . EKG     Prior Assessment and Plan Problem List as of 12/24/2015      Cardiovascular and Mediastinum   Chronic atrial fibrillation [facility])   Last Assessment & Plan 12/04/2014 Office Visit Written 12/04/2014 12:18 PM by Imogene Burn, PA-C    Rate better controlled on higher dose diltiazem. Continue metoprolol and Coumadin.      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 2-D echo showed normal LV function. Diastolic function could not be assessed but she had severely dilated right and left atrium PA pressure was 50 mmHg. She diuresed and is currently compensated. Will check renal function and potassium today. Continue current diuretics      CHF (congestive heart failure) (HCC)     Respiratory   Pleural effusion   Pleural effusion on left   Community acquired pneumonia   Acute on chronic respiratory failure with hypoxia (HCC)     Endocrine   Hypothyroidism     Nervous and Auditory   Hard of hearing     Musculoskeletal and Integument   Hip fracture, right (HCC)   Compartment syndrome, nontraumatic, lower  extremity     Hematopoietic and Hemostatic   Thrombocytosis ([place])     Other   Anticoagulant long-term use   Polycythemia vera [facility])   Last Assessment & Plan 09/06/2015 Office Visit Edited 09/06/2015  1:44 PM by Baird Cancer, PA-C    JAK 2 positive MPD, P. Vera. Currently on Hydrea 500 mg BID M-F with a history of interruption of therapy secondary symptomatic pancytopenia.  Counts normalized with cessation of Hydrea, but thrombocytosis was noted.  She is not a candidate for Anagrelide given her cardiac issue(s).  Hydrea was restarted and she is very stable at the current dose of 500 mg BID M-F with intermittent therapeutic phlebotomy.  Labs today: CBC diff.  Labs every 4 weeks: CBC diff  Return in 12  weeks for follow-up.  Continue same dose of Hydrea (500 mg BID).  Will adjust dose if necessary.      Pain of right thigh   Wound infection after surgery   Swollen leg   Pain in limb   Aftercare following surgery of the  circulatory system, NEC   Encounter for therapeutic drug monitoring   Dyspnea   Cough   Hypoxemia       Imaging: Dg Chest 2 View  11/29/2015  CLINICAL DATA:  Productive cough, congestion since Monday. Increased sob/hypoxia x 2 days. Oxygen upped to 6 liters from 4 constantly. EXAM: CHEST  2 VIEW COMPARISON:  08/09/2015 FINDINGS: There are moderate left and small right pleural effusions. The left has decreased in size from prior x-ray. Right is stable. There is associated basilar lung opacity, greater on the left, most likely atelectasis. There are prominent bronchovascular markings but no overt pulmonary edema. No No pneumothorax. Cardiac silhouette is mildly enlarged. No mediastinal or hilar masses or evidence of adenopathy. Bony thorax is demineralized but grossly intact. IMPRESSION: 1. No acute cardiopulmonary disease. 2. Moderate left and small right pleural effusions with associated lung base atelectasis. No convincing pulmonary edema. Left pleural effusion is smaller than it was on the prior chest radiographs. Electronically Signed   By: Lajean Manes M.D.   On: 11/29/2015 14:30

## 2015-12-24 NOTE — Patient Instructions (Signed)
Medication Instructions:  Your physician recommends that you continue on your current medications as directed. Please refer to the Current Medication list given to you today.    Labwork: Your physician recommends that you return for lab work in: today  bmet    Testing/Procedures: none  Follow-Up: Your physician recommends that you schedule a follow-up appointment in: 3 months    Any Other Special Instructions Will Be Listed Below (If Applicable).     If you need a refill on your cardiac medications before your next appointment, please call your pharmacy.

## 2015-12-24 NOTE — Progress Notes (Signed)
Cardiology Office Note   Date:  12/24/2015   ID:  Charlotte [name], DOB 1941/05/28, MRN [id number]  PCP:  [name], MD  Cardiologist: Woodroe Chen, NP   No chief complaint on file.     History of Present Illness: Charlotte [name] is a 75 y.o. female who presents for ongoing assessment and management of chronic diastolic heart failure, atrial fibrillation on Coumadin,most recent echocardiogram April 2017 revealing EF of 60-65% with severely dilated left atrium. PA pressure 48 mm mercury.  She was discharged from [facility] on 11/29/2015 after decompensated diastolic heart failure.she was diuresed with IV diuretics, she was sent home on higher doses of torsemide at 40 mg daily, continued on lisinopril, potassium, and diltiazem 240 mg daily.she is also to continue Coumadin therapy.  She is also followed by Dr. Whitney Muse, oncology, for myeloproliferative disease and polycythemia vera.  She is here today feeling very well. Maintaining her weight. Medications have not caused any problems with dizziness bleeding she has not felt palpitations. She is able to sleep through the night now without getting up often in order to breath, as she had been in the past. INR is checked in the office today. The more  Past Medical History  Diagnosis Date  . Essential hypertension   . Arthritis   . Varicose veins   . Deaf   . Peripheral neuropathy ([place])   . 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 Atrial fibrillation, chronic ([place])   . Peripheral vascular disease ([place])   . Ulcer of ankle ([place])     Bilateral 2015  . DVT (deep venous thrombosis) ([place])   . Hypothyroidism   . Diastolic heart failure [facility])     Past Surgical History  Procedure Laterality Date  . Tubal ligation    . Abdominal hysterectomy    . Coronary angioplasty    . Hip pinning,cannulated  11/19/2011    Procedure: CANNULATED HIP PINNING;  Surgeon: Sanjuana Kava, MD;  Location: [facility];  Service:  Orthopedics;  Laterality: Right;  . Fasciotomy  11/29/2011    Procedure: FASCIOTOMY;  Surgeon: Carole Civil, MD;  Location: [facility];  Service: Orthopedics;  Laterality: Right;  right thigh   . Dressing change under anesthesia  12/02/2011    Procedure: DRESSING CHANGE UNDER ANESTHESIA;  Surgeon: Carole Civil, MD;  Location: [facility];  Service: Orthopedics;  Laterality: Right;  . Endarterectomy femoral Right 09/15/2012    Procedure: ENDARTERECTOMY FEMORAL;  Surgeon: Mal Misty, MD;  Location: [place];  Service: Vascular;  Laterality: Right;  . Femoral-popliteal bypass graft Right 09/15/2012    Procedure: BYPASS GRAFT FEMORAL-POPLITEAL ARTERY;  Surgeon: Mal Misty, MD;  Location: [place];  Service: Vascular;  Laterality: Right;  . Patch angioplasty Right 09/15/2012    Procedure: PATCH ANGIOPLASTY;  Surgeon: Mal Misty, MD;  Location: [place];  Service: Vascular;  Laterality: Right;  . Abdominal aortagram N/A 09/14/2012    Procedure: ABDOMINAL [name];  Surgeon: Serafina Mitchell, MD;  Location: [facility] CATH LAB;  Service: Cardiovascular;  Laterality: N/A;     Current Outpatient Prescriptions  Medication Sig Dispense Refill  . aspirin EC 81 MG tablet Take 81 mg by mouth daily.    . benzonatate (TESSALON) 100 MG capsule Reported on 12/20/2015    . CARTIA XT 240 MG 24 hr capsule Take 240 mg by mouth daily.     . Cholecalciferol (VITAMIN D3) 5000 UNITS CAPS Take 1 capsule by mouth daily.    Marland Kitchen  gabapentin (NEURONTIN) 100 MG capsule Take 100 mg by mouth 3 (three) times daily.      . hydroxyurea (HYDREA) 500 MG capsule Take 500 mg PO BID Monday- Friday.  May take with food to minimize GI side effects. (Patient taking differently: Take 500 mg by mouth 2 (two) times daily. Monday- Friday ONLY.  May take with food to minimize GI side effects.) 40 capsule 5  . levothyroxine (SYNTHROID, LEVOTHROID) 75 MCG tablet Take 75 mcg by mouth daily before breakfast.     . lisinopril (PRINIVIL,ZESTRIL) 10 MG tablet  Take 1 tablet (10 mg total) by mouth daily. 30 tablet 0  . metoprolol (LOPRESSOR) 100 MG tablet Take 100 mg by mouth 2 (two) times daily.    . Potassium Chloride ER 20 MEQ TBCR Take 20 mEq by mouth daily. 30 tablet 1  . sodium chloride (OCEAN) 0.65 % SOLN nasal spray Place 1 spray into both nostrils as needed for congestion. 1 Bottle 0  . torsemide (DEMADEX) 20 MG tablet Take 2 tablets (40 mg total) by mouth daily. 30 tablet 0  . warfarin (COUMADIN) 5 MG tablet TAKE AS DIRECTED BY  COUMADIN  CLINIC 30 tablet 0   No current facility-administered medications for this visit.    Allergies:   Review of patient's allergies indicates no known allergies.    Social History:  The patient  reports that she has never smoked. She has never used smokeless tobacco. She reports that she does not drink alcohol or use illicit drugs.   Family History:  The patient's family history includes Diabetes in her son; Heart failure in her mother; Hypertension in her sister and sister; Stroke in her father and sister.    ROS: All other systems are reviewed and negative. Unless otherwise mentioned in H&P    PHYSICAL EXAM: VS:  BP 112/62 mmHg  Pulse 84  Ht 6' (1.829 m)  Wt 162 lb (73.483 kg)  BMI 21.97 kg/m2  SpO2 91% , BMI Body mass index is 21.97 kg/(m^2). GEN: Well nourished, well developed, in no acute distress HEENT: normal Neck: no JVD, carotid bruits, or masses Cardiac: IRRR; no murmurs, rubs, or gallops,no edema  Respiratory:  clear to auscultation bilaterally, normal work of breathing GI: soft, nontender, nondistended, + BS MS: no deformity or atrophy Skin: warm and dry, no rash. Multiple varicosities. Neuro:  Strength and sensation are intact Psych: euthymic mood, full affect   Recent Labs: 11/29/2015: ALT 15; B Natriuretic Peptide 816.0* 12/01/2015: Magnesium 1.6* 12/03/2015: BUN 15; Creatinine, Ser 0.75; Potassium 3.5; Sodium 142 12/20/2015: Hemoglobin 13.3; Platelets 394    Lipid Panel     Component Value Date/Time   CHOL  07/19/2009 0448    119        ATP III CLASSIFICATION:  <200     mg/dL   Desirable  200-239  mg/dL   Borderline High  >=240    mg/dL   High          TRIG 124 07/19/2009 0448   HDL 32* 07/19/2009 0448   CHOLHDL 3.7 07/19/2009 0448   VLDL 25 07/19/2009 0448   LDLCALC  07/19/2009 0448    62        Total Cholesterol/HDL:CHD Risk Coronary Heart Disease Risk Table                     Men   Women  1/2 Average Risk   3.4   3.3  Average Risk  5.0   4.4  2 X Average Risk   9.6   7.1  3 X Average Risk  23.4   11.0        Use the calculated Patient Ratio above and the CHD Risk Table to determine the patient's CHD Risk.        ATP III CLASSIFICATION (LDL):  <100     mg/dL   Optimal  100-129  mg/dL   Near or Above                    Optimal  130-159  mg/dL   Borderline  160-189  mg/dL   High  >190     mg/dL   Very High      Wt Readings from Last 3 Encounters:  12/24/15 162 lb (73.483 kg)  12/20/15 162 lb 12.8 oz (73.846 kg)  12/03/15 165 lb 5.5 oz (75 kg)      ASSESSMENT AND PLAN:  1.  Atrial fibrillation: heart rate is currently well-controlled on Cartia 240 mg daily, and metoprolol 100 mg twice a day. She also has tolerated Coumadin and will have INR checked today. No changes in her medication regimen.  2. Chronic diastolic CHF: she has maintained her weight, is avoiding salty foods, and weighing daily. I will give her refills on lisinopril 10 mg daily and torsemide 40 mg daily. I will check a BMET.  3. Hypertension: blood pressure is stable. We'll continue her on her current medication regimen. We'll not make any changes at this time.   Current medicines are reviewed at length with the patient today.    Labs/ tests ordered today include: BMET No orders of the defined types were placed in this encounter.     Disposition:   FU with 3 months Signed, Jory Sims, NP  12/24/2015 12:59 PM    [facility] [address], [place], [place] [address] Phone: [phone]; Fax: [phone]

## 2015-12-31 ENCOUNTER — Other Ambulatory Visit: Payer: Self-pay | Admitting: Cardiovascular Disease

## 2016-01-17 ENCOUNTER — Encounter (HOSPITAL_COMMUNITY): Payer: Medicare Other | Attending: Hematology & Oncology

## 2016-01-17 DIAGNOSIS — D45 Polycythemia vera: Secondary | ICD-10-CM | POA: Diagnosis not present

## 2016-01-17 LAB — CBC WITH DIFFERENTIAL/PLATELET
BASOS ABS: 0.3 10*3/uL — AB (ref 0.0–0.1)
Basophils Relative: 2 %
EOS PCT: 2 %
Eosinophils Absolute: 0.3 10*3/uL (ref 0.0–0.7)
HEMATOCRIT: 40.9 % (ref 36.0–46.0)
Hemoglobin: 12.2 g/dL (ref 12.0–15.0)
LYMPHS ABS: 1.5 10*3/uL (ref 0.7–4.0)
LYMPHS PCT: 9 %
MCH: 25.2 pg — AB (ref 26.0–34.0)
MCHC: 29.8 g/dL — ABNORMAL LOW (ref 30.0–36.0)
MCV: 84.3 fL (ref 78.0–100.0)
MONO ABS: 0.5 10*3/uL (ref 0.1–1.0)
MONOS PCT: 3 %
NEUTROS ABS: 14.2 10*3/uL — AB (ref 1.7–7.7)
Neutrophils Relative %: 84 %
PLATELETS: 398 10*3/uL (ref 150–400)
RBC: 4.85 MIL/uL (ref 3.87–5.11)
RDW: 23.3 % — AB (ref 11.5–15.5)
WBC: 16.9 10*3/uL — ABNORMAL HIGH (ref 4.0–10.5)

## 2016-01-21 ENCOUNTER — Ambulatory Visit (INDEPENDENT_AMBULATORY_CARE_PROVIDER_SITE_OTHER): Payer: Medicare Other | Admitting: *Deleted

## 2016-01-21 DIAGNOSIS — I482 Chronic atrial fibrillation, unspecified: Secondary | ICD-10-CM

## 2016-01-21 DIAGNOSIS — Z5181 Encounter for therapeutic drug level monitoring: Secondary | ICD-10-CM

## 2016-01-21 LAB — POCT INR: INR: 2.1

## 2016-02-14 ENCOUNTER — Encounter (HOSPITAL_COMMUNITY): Payer: Medicare Other

## 2016-02-14 ENCOUNTER — Encounter (HOSPITAL_COMMUNITY): Payer: Medicare Other | Attending: Hematology and Oncology | Admitting: Oncology

## 2016-02-14 VITALS — BP 120/62 | HR 78 | Temp 97.8°F | Resp 20 | Wt 164.9 lb

## 2016-02-14 DIAGNOSIS — D45 Polycythemia vera: Secondary | ICD-10-CM | POA: Insufficient documentation

## 2016-02-14 LAB — CBC WITH DIFFERENTIAL/PLATELET
BASOS ABS: 0.5 10*3/uL — AB (ref 0.0–0.1)
Basophils Relative: 2 %
EOS ABS: 0.4 10*3/uL (ref 0.0–0.7)
EOS PCT: 2 %
HCT: 39.3 % (ref 36.0–46.0)
Hemoglobin: 11.5 g/dL — ABNORMAL LOW (ref 12.0–15.0)
Lymphocytes Relative: 6 %
Lymphs Abs: 1.2 10*3/uL (ref 0.7–4.0)
MCH: 24.7 pg — AB (ref 26.0–34.0)
MCHC: 29.3 g/dL — ABNORMAL LOW (ref 30.0–36.0)
MCV: 84.3 fL (ref 78.0–100.0)
MONO ABS: 0.7 10*3/uL (ref 0.1–1.0)
Monocytes Relative: 3 %
Neutro Abs: 18.4 10*3/uL — ABNORMAL HIGH (ref 1.7–7.7)
Neutrophils Relative %: 87 %
PLATELETS: 426 10*3/uL — AB (ref 150–400)
RBC: 4.66 MIL/uL (ref 3.87–5.11)
RDW: 21.2 % — AB (ref 11.5–15.5)
WBC: 21.1 10*3/uL — AB (ref 4.0–10.5)

## 2016-02-14 NOTE — Progress Notes (Signed)
Charlotte Kilts, MD [facility] [id number]  Polycythemia vera [facility]) - Plan: CBC, CBC, CBC with Differential  CURRENT THERAPY: Hydrea 1000 mg Monday-Friday  INTERVAL HISTORY: Charlotte [name] 75 y.o. 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    From a hematologic standpoint, the patient is doing very well from a hematologic standpoint.  She denies any complaints or new complaints.    She is tolerating Hydrea very well.  Review of Systems  Constitutional: Negative for fever, chills and weight loss.  HENT: Negative.   Eyes: Negative.   Respiratory: Positive for shortness of breath (at baseline).   Cardiovascular: Negative.   Gastrointestinal: Negative.   Genitourinary: Negative.   Musculoskeletal: Negative.   Skin: Negative.  Negative for rash.  Neurological: Negative.   Endo/Heme/Allergies: Negative.   Psychiatric/Behavioral: Negative.     Past Medical History  Diagnosis Date  . Essential hypertension   . Arthritis   . Varicose veins   . Deaf   . Peripheral neuropathy ([place])   . 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 Atrial fibrillation, chronic ([place])   . Peripheral vascular disease ([place])   . Ulcer of ankle ([place])     Bilateral 2015  . DVT (deep venous thrombosis) ([place])   . Hypothyroidism   . Diastolic heart failure [facility])     Past Surgical History  Procedure Laterality Date  . Tubal ligation    . Abdominal hysterectomy    . Coronary angioplasty    . Hip pinning,cannulated  11/19/2011    Procedure: CANNULATED HIP PINNING;  Surgeon: Sanjuana Kava, MD;  Location: [facility];  Service: Orthopedics;  Laterality: Right;  . Fasciotomy  11/29/2011    Procedure: FASCIOTOMY;  Surgeon: Carole Civil, MD;  Location: [facility];  Service: Orthopedics;  Laterality: Right;  right thigh   . Dressing change under anesthesia  12/02/2011    Procedure: DRESSING  CHANGE UNDER ANESTHESIA;  Surgeon: Carole Civil, MD;  Location: [facility];  Service: Orthopedics;  Laterality: Right;  . Endarterectomy femoral Right 09/15/2012    Procedure: ENDARTERECTOMY FEMORAL;  Surgeon: Mal Misty, MD;  Location: [place];  Service: Vascular;  Laterality: Right;  . Femoral-popliteal bypass graft Right 09/15/2012    Procedure: BYPASS GRAFT FEMORAL-POPLITEAL ARTERY;  Surgeon: Mal Misty, MD;  Location: [place];  Service: Vascular;  Laterality: Right;  . Patch angioplasty Right 09/15/2012    Procedure: PATCH ANGIOPLASTY;  Surgeon: Mal Misty, MD;  Location: [place];  Service: Vascular;  Laterality: Right;  . Abdominal aortagram N/A 09/14/2012    Procedure: ABDOMINAL [name];  Surgeon: Serafina Mitchell, MD;  Location: [facility] CATH LAB;  Service: Cardiovascular;  Laterality: N/A;    Family History  Problem Relation Age of Onset  . Diabetes Son   . Heart failure Mother   . Stroke Father   . Hypertension Sister   . Hypertension Sister   . Stroke Sister     Social History   Social History  . Marital Status: Married    Spouse Name: N/A  . Number of Children: N/A  . Years of Education: N/A   Social History Main Topics  . Smoking status: Never Smoker   . Smokeless tobacco: Never Used  . Alcohol Use: No  . Drug Use: No  . Sexual Activity: No   Other Topics Concern  . Not on file   Social History Narrative  PHYSICAL EXAMINATION  ECOG PERFORMANCE STATUS: 1 - Symptomatic but completely ambulatory  Filed Vitals:   02/14/16 1449  Temp: 97.8 F (36.6 C)  Resp: 20    GENERAL:alert, no distress, comfortable, cooperative, smiling and accompanied by her husband SKIN: skin color, texture, turgor are normal, no rashes or significant lesions HEAD: Normocephalic, No masses, lesions, tenderness or abnormalities EYES: normal, PERRLA, Conjunctiva are pink and non-injected EARS: External ears normal OROPHARYNX:lips, buccal mucosa, and tongue normal and mucous  membranes are moist  NECK: supple, trachea midline LYMPH:  not examined BREAST:not examined LUNGS: clear to auscultation , decreased breath sounds HEART: irregularly irregular ABDOMEN:abdomen soft and normal bowel sounds BACK: Back symmetric, no curvature. EXTREMITIES:less then 2 second capillary refill, no joint deformities, effusion, or inflammation, no skin discoloration, no cyanosis, positive findings:  Varicose veins in lower extremities.  NEURO: alert & oriented x 3 with fluent speech, no focal motor/sensory deficits, gait normal   LABORATORY DATA: CBC    Component Value Date/Time   WBC 21.1* 02/14/2016 1342   RBC 4.66 02/14/2016 1342   RBC 3.48* 08/19/2013 0828   HGB 11.5* 02/14/2016 1342   HCT 39.3 02/14/2016 1342   PLT 426* 02/14/2016 1342   MCV 84.3 02/14/2016 1342   MCH 24.7* 02/14/2016 1342   MCHC 29.3* 02/14/2016 1342   RDW 21.2* 02/14/2016 1342   LYMPHSABS 1.2 02/14/2016 1342   MONOABS 0.7 02/14/2016 1342   EOSABS 0.4 02/14/2016 1342   BASOSABS 0.5* 02/14/2016 1342      Chemistry      Component Value Date/Time   NA 136 12/24/2015 1354   K 3.7 12/24/2015 1354   CL 101 12/24/2015 1354   CO2 27 12/24/2015 1354   BUN 18 12/24/2015 1354   CREATININE 1.01* 12/24/2015 1354   CREATININE 1.07 12/04/2014 1248      Component Value Date/Time   CALCIUM 8.8* 12/24/2015 1354   ALKPHOS 66 11/29/2015 1349   AST 20 11/29/2015 1349   ALT 15 11/29/2015 1349   BILITOT 1.9* 11/29/2015 1349        PENDING LABS:   RADIOGRAPHIC STUDIES:  No results found.   PATHOLOGY:    ASSESSMENT AND PLAN:  Polycythemia vera ([place]) JAK 2 positive MPD, P. Vera. Currently on Hydrea 1000 mg daily M-F with a history of interruption of therapy secondary symptomatic pancytopenia.  Counts normalized with cessation of Hydrea, but thrombocytosis was noted.  She is not a candidate for Anagrelide given her cardiac issue(s).  Hydrea was restarted and she is very stable at the current dose  of 1000 mg M-F with intermittent therapeutic phlebotomy.  This is complicated by chronic a-fib requiring anticoagulation, and COPD and chronic hypoxemia.  Labs today: CBC diff.  I personally reviewed and went over laboratory results with the patient.  The results are noted within this dictation.  She is noted to be anemic today.  Given her significant cardiac issues, it would be prudent to reduce her Hydrea dose with short-interval repeat CBC check.  We will decrease dose to 500 mg Monday-Friday.  Labs in 2 weeks: CBC  Labs in 4 weeks: CBC  Labs in 8 weeks: CBC diff.  Return in 8 weeks for follow-up with possible dosing adjustments moving forward depending on response to decrease Hydrea dose.    ORDERS PLACED FOR THIS ENCOUNTER: Orders Placed This Encounter  Procedures  . CBC  . CBC  . CBC with Differential    MEDICATIONS PRESCRIBED THIS ENCOUNTER: No orders of the defined types  were placed in this encounter.    THERAPY PLAN:  Continue with Hydrea as planned with outlined dosing adjustment.   All questions were answered. The patient knows to call the clinic with any problems, questions or concerns. We can certainly see the patient much sooner if necessary.  Patient and plan discussed with Dr. Ancil Linsey and she is in agreement with the aforementioned.   This note is electronically signed by: Doy Mince 02/14/2016 3:23 PM

## 2016-02-14 NOTE — Patient Instructions (Signed)
[place] at [facility] Discharge Instructions  RECOMMENDATIONS MADE BY THE CONSULTANT AND ANY TEST RESULTS WILL BE SENT TO YOUR REFERRING PHYSICIAN.  You were seen by Kirby Crigler, PA today. He would like you to decrease your hydrea to 500 mg Monday-Friday. Do not take this on Saturday and Sunday. Lab work in 2 weeks, 4 weeks, and then 8 weeks. Return to clinic in 8 weeks for follow-up appointment. Call clinic with any questions or concerns.   Thank you for choosing [place] at [facility] to provide your oncology and hematology care.  To afford each patient quality time with our provider, please arrive at least 15 minutes before your scheduled appointment time.   Beginning January 23rd 2017 lab work for the [facility] will be done in the  Main lab at [facility] on 1st floor. If you have a lab appointment with the [facility] please come in thru the  Main Entrance and check in at the main information desk  You need to re-schedule your appointment should you arrive 10 or more minutes late.  We strive to give you quality time with our providers, and arriving late affects you and other patients whose appointments are after yours.  Also, if you no show three or more times for appointments you may be dismissed from the clinic at the providers discretion.     Again, thank you for choosing [facility].  Our hope is that these requests will decrease the amount of time that you wait before being seen by our physicians.       _____________________________________________________________  Should you have questions after your visit to [facility], please contact our office at [phone] between the hours of 8:30 a.m. and 4:30 p.m.  Voicemails left after 4:30 p.m. will not be returned until the following business day.  For prescription refill requests, have your pharmacy contact our office.         Resources For Cancer  Patients and their Caregivers ? American Cancer Society: Can assist with transportation, wigs, general needs, runs Look Good Feel Better.        412-530-1475 ? Cancer Care: Provides financial assistance, online support groups, medication/co-pay assistance.  1-800-813-HOPE 660-787-2488) ? [place] Assists [place] cancer patients and their families through emotional , educational and financial support.  (347)454-3340 ? [place] DSS Where to apply for food stamps, Medicaid and utility assistance. 518 615 0922 ? [employer]: Transportation to medical appointments. 215-319-4209 ? Social Security Administration: May apply for disability if have a Stage IV cancer. (562) 823-6695 424-020-5049 ? [employer], Disability and Transit Services: Assists with nutrition, care and transit needs. [place] Support Programs: @10RELATIVEDAYS @ > Cancer Support Group  2nd Tuesday of the month 1pm-2pm, Journey Room  > Creative Journey  3rd Tuesday of the month 1130am-1pm, Journey Room  > Look Good Feel Better  1st Wednesday of the month 10am-12 noon, Journey Room (Call [place] to register [phone])

## 2016-02-14 NOTE — Assessment & Plan Note (Addendum)
JAK 2 positive MPD, P. Vera. Currently on Hydrea 1000 mg daily M-F with a history of interruption of therapy secondary symptomatic pancytopenia.  Counts normalized with cessation of Hydrea, but thrombocytosis was noted.  She is not a candidate for Anagrelide given her cardiac issue(s).  Hydrea was restarted and she is very stable at the current dose of 1000 mg M-F with intermittent therapeutic phlebotomy.  This is complicated by chronic a-fib requiring anticoagulation, and COPD and chronic hypoxemia.  Labs today: CBC diff.  I personally reviewed and went over laboratory results with the patient.  The results are noted within this dictation.  She is noted to be anemic today.  Given her significant cardiac issues, it would be prudent to reduce her Hydrea dose with short-interval repeat CBC check.  We will decrease dose to 500 mg Monday-Friday.  Labs in 2 weeks: CBC  Labs in 4 weeks: CBC  Labs in 8 weeks: CBC diff.  Return in 8 weeks for follow-up with possible dosing adjustments moving forward depending on response to decrease Hydrea dose.

## 2016-02-18 ENCOUNTER — Ambulatory Visit (INDEPENDENT_AMBULATORY_CARE_PROVIDER_SITE_OTHER): Payer: Medicare Other | Admitting: *Deleted

## 2016-02-18 DIAGNOSIS — Z5181 Encounter for therapeutic drug level monitoring: Secondary | ICD-10-CM | POA: Diagnosis not present

## 2016-02-18 DIAGNOSIS — I482 Chronic atrial fibrillation, unspecified: Secondary | ICD-10-CM

## 2016-02-18 LAB — POCT INR: INR: 2

## 2016-02-26 ENCOUNTER — Other Ambulatory Visit: Payer: Self-pay | Admitting: Cardiovascular Disease

## 2016-02-28 ENCOUNTER — Encounter (HOSPITAL_COMMUNITY): Payer: Medicare Other

## 2016-02-28 DIAGNOSIS — D45 Polycythemia vera: Secondary | ICD-10-CM | POA: Diagnosis not present

## 2016-02-28 LAB — CBC
HCT: 41 % (ref 36.0–46.0)
HEMOGLOBIN: 11.9 g/dL — AB (ref 12.0–15.0)
MCH: 23.7 pg — AB (ref 26.0–34.0)
MCHC: 29 g/dL — ABNORMAL LOW (ref 30.0–36.0)
MCV: 81.5 fL (ref 78.0–100.0)
Platelets: 739 10*3/uL — ABNORMAL HIGH (ref 150–400)
RBC: 5.03 MIL/uL (ref 3.87–5.11)
RDW: 20.6 % — ABNORMAL HIGH (ref 11.5–15.5)
WBC: 26.7 10*3/uL — ABNORMAL HIGH (ref 4.0–10.5)

## 2016-03-04 DIAGNOSIS — L03116 Cellulitis of left lower limb: Secondary | ICD-10-CM | POA: Diagnosis not present

## 2016-03-04 DIAGNOSIS — Z6822 Body mass index (BMI) 22.0-22.9, adult: Secondary | ICD-10-CM | POA: Diagnosis not present

## 2016-03-04 DIAGNOSIS — E039 Hypothyroidism, unspecified: Secondary | ICD-10-CM | POA: Diagnosis not present

## 2016-03-04 DIAGNOSIS — Z1389 Encounter for screening for other disorder: Secondary | ICD-10-CM | POA: Diagnosis not present

## 2016-03-05 ENCOUNTER — Telehealth: Payer: Self-pay | Admitting: *Deleted

## 2016-03-06 NOTE — Telephone Encounter (Signed)
Pt was started on Augmentin 1 tablet 2 x day x 10 days for foot wound.  Told pt to decrease coumadin to 2.5mg  daily except 5mg  on Friday until finished with antibiotic. Then resume 2.5mg  daily except 5mg  on M,W,F.  Keep INR appt on 8/14.  Pt verbalized understanding.

## 2016-03-13 ENCOUNTER — Encounter (HOSPITAL_COMMUNITY): Payer: Medicare Other | Attending: Hematology & Oncology

## 2016-03-13 ENCOUNTER — Encounter (HOSPITAL_COMMUNITY): Payer: Self-pay | Admitting: Emergency Medicine

## 2016-03-13 ENCOUNTER — Other Ambulatory Visit (HOSPITAL_COMMUNITY): Payer: Self-pay | Admitting: Oncology

## 2016-03-13 DIAGNOSIS — D45 Polycythemia vera: Secondary | ICD-10-CM | POA: Insufficient documentation

## 2016-03-13 DIAGNOSIS — D473 Essential (hemorrhagic) thrombocythemia: Secondary | ICD-10-CM | POA: Diagnosis not present

## 2016-03-13 DIAGNOSIS — D649 Anemia, unspecified: Secondary | ICD-10-CM | POA: Insufficient documentation

## 2016-03-13 DIAGNOSIS — D72829 Elevated white blood cell count, unspecified: Secondary | ICD-10-CM | POA: Insufficient documentation

## 2016-03-13 LAB — CBC
HCT: 38.8 % (ref 36.0–46.0)
Hemoglobin: 11.4 g/dL — ABNORMAL LOW (ref 12.0–15.0)
MCH: 23.6 pg — ABNORMAL LOW (ref 26.0–34.0)
MCHC: 29.4 g/dL — ABNORMAL LOW (ref 30.0–36.0)
MCV: 80.3 fL (ref 78.0–100.0)
PLATELETS: 949 10*3/uL — AB (ref 150–400)
RBC: 4.83 MIL/uL (ref 3.87–5.11)
RDW: 20.7 % — ABNORMAL HIGH (ref 11.5–15.5)
WBC: 24.6 10*3/uL — AB (ref 4.0–10.5)

## 2016-03-13 NOTE — Progress Notes (Signed)
Called pt to verify dose that she was taking on her hydrea.  She was taking 2 capsule daily m-f.  Kirby Crigler PA notified.  Called pt to let her know to take 1000 mg Monday through Sunday (everyday).  Pt verbalized understanding.

## 2016-03-13 NOTE — Progress Notes (Unsigned)
CRITICAL VALUE ALERT Critical value received:  Platelets [phone] 03/13/16  Date of notification:  03/13/16 Time of notification: [id number] Critical value read back:  Yes.   Nurse who received alert:  A.Travis,LPN MD notified (1st page):  Verbal to Meriel Flavors at 1245

## 2016-03-14 LAB — PATHOLOGIST SMEAR REVIEW

## 2016-03-17 ENCOUNTER — Ambulatory Visit (INDEPENDENT_AMBULATORY_CARE_PROVIDER_SITE_OTHER): Payer: Medicare Other | Admitting: *Deleted

## 2016-03-17 DIAGNOSIS — Z5181 Encounter for therapeutic drug level monitoring: Secondary | ICD-10-CM

## 2016-03-17 DIAGNOSIS — I482 Chronic atrial fibrillation, unspecified: Secondary | ICD-10-CM

## 2016-03-17 LAB — POCT INR: INR: 1.5

## 2016-03-25 ENCOUNTER — Ambulatory Visit (INDEPENDENT_AMBULATORY_CARE_PROVIDER_SITE_OTHER): Payer: Medicare Other | Admitting: Adult Health

## 2016-03-25 ENCOUNTER — Encounter: Payer: Self-pay | Admitting: Adult Health

## 2016-03-25 VITALS — BP 110/68 | HR 69 | Ht 72.0 in | Wt 170.0 lb

## 2016-03-25 DIAGNOSIS — I10 Essential (primary) hypertension: Secondary | ICD-10-CM | POA: Diagnosis not present

## 2016-03-25 DIAGNOSIS — I5032 Chronic diastolic (congestive) heart failure: Secondary | ICD-10-CM | POA: Diagnosis not present

## 2016-03-25 DIAGNOSIS — I482 Chronic atrial fibrillation, unspecified: Secondary | ICD-10-CM

## 2016-03-25 MED ORDER — TORSEMIDE 20 MG PO TABS: 40.0000 mg | ORAL_TABLET | Freq: Every day | ORAL | 3 refills | Status: DC

## 2016-03-25 NOTE — Progress Notes (Signed)
Name: Charlotte [name]    DOB: October 18, 1940  Age: 75 y.o.  MR#: [id number]       PCP:  [name], MD      Insurance: Payor: MEDICARE / Plan: MEDICARE PART A AND B / Product Type: *No Product type* /   CC:    Chief Complaint  Patient presents with  . Congestive Heart Failure  . Atrial Fibrillation    VS Vitals:   03/25/16 1256  Weight: 170 lb (77.1 kg)  Height: 6' (1.829 m)    Weights Current Weight  03/25/16 170 lb (77.1 kg)  02/14/16 164 lb 14.4 oz (74.8 kg)  12/24/15 162 lb (73.5 kg)    Blood Pressure  BP Readings from Last 3 Encounters:  02/14/16 120/62  12/24/15 112/62  12/20/15 105/72     Admit date:  (Not on file) Last encounter with RMR:  12/24/2015   Allergy Review of patient's allergies indicates no known allergies.  Current Outpatient Prescriptions  Medication Sig Dispense Refill  . aspirin EC 81 MG tablet Take 81 mg by mouth daily.    Marland Kitchen CARTIA XT 240 MG 24 hr capsule Take 240 mg by mouth daily.     Marland Kitchen CARTIA XT 240 MG 24 hr capsule TAKE ONE CAPSULE BY MOUTH ONCE DAILY 90 capsule 3  . Cholecalciferol (VITAMIN D3) 5000 UNITS CAPS Take 1 capsule by mouth daily.    Marland Kitchen gabapentin (NEURONTIN) 100 MG capsule Take 100 mg by mouth 3 (three) times daily.      . hydroxyurea (HYDREA) 500 MG capsule TAKE ONE CAPSULE BY MOUTH TWICE DAILY MONDAY  THROUGH  FRIDAY  --MAY  TAKE  WITH  FOOD  TO  MINIMIZE  GI  SIDE  EFFECTS 40 capsule 3  . levothyroxine (SYNTHROID, LEVOTHROID) 75 MCG tablet Take 75 mcg by mouth daily before breakfast.     . metoprolol (LOPRESSOR) 100 MG tablet Take 100 mg by mouth 2 (two) times daily.    . Potassium Chloride ER 20 MEQ TBCR Take 20 mEq by mouth daily. 30 tablet 1  . torsemide (DEMADEX) 20 MG tablet TAKE TWO TABLETS BY MOUTH ONCE DAILY 30 tablet 3  . warfarin (COUMADIN) 5 MG tablet TAKE AS DIRECTED BY  COUMADIN  CLINIC 30 tablet 4   No current facility-administered medications for this visit.     Discontinued Meds:   There are no  discontinued medications.  Patient Active Problem List   Diagnosis Date Noted  . Cough 11/29/2015  . Hypoxemia 11/29/2015  . Community acquired pneumonia 11/29/2015  . CHF (congestive heart failure) ([employer]) 11/29/2015  . Acute on chronic respiratory failure with hypoxia ([place])   . Pleural effusion on left   . Acute on chronic diastolic (congestive) heart failure ([place])   . Dyspnea 11/22/2014  . Pleural effusion 11/22/2014  . Hard of hearing 11/22/2014  . Thrombocytosis ([place]) 11/22/2014  . Encounter for therapeutic drug monitoring 08/31/2013  . Aftercare following surgery of the circulatory system, [place] 12/21/2012  . Pain in limb 10/19/2012  . Peripheral vascular disease ([place]) 09/24/2012  . Swollen leg 09/24/2012  . Varicose veins of lower extremities with other complications [id number]  . 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 Wound infection after surgery 01/07/2012  . Compartment syndrome, nontraumatic, lower extremity 11/29/2011  . Pain of right thigh 11/28/2011  . Hip fracture, right ([place]) 11/15/2011  . Chronic atrial fibrillation ([place]) 11/15/2011  .  Anticoagulant long-term use 11/15/2011  . HTN (hypertension) 11/15/2011  . Hypothyroidism 11/15/2011  . Polycythemia vera ([place]) 10/01/2011    LABS    Component Value Date/Time   NA 136 12/24/2015 1354   NA 142 12/03/2015 0456   NA 141 12/02/2015 0452   K 3.7 12/24/2015 1354   K 3.5 12/03/2015 0456   K 3.4 (L) 12/02/2015 0452   CL 101 12/24/2015 1354   CL 102 12/03/2015 0456   CL 103 12/02/2015 0452   CO2 27 12/24/2015 1354   CO2 30 12/03/2015 0456   CO2 30 12/02/2015 0452   GLUCOSE 91 12/24/2015 1354   GLUCOSE 88 12/03/2015 0456   GLUCOSE 89 12/02/2015 0452   BUN 18 12/24/2015 1354   BUN 15 12/03/2015 0456   BUN 16 12/02/2015 0452   CREATININE 1.01 (H) 12/24/2015 1354   CREATININE 0.75 12/03/2015 0456   CREATININE 0.87  12/02/2015 0452   CREATININE 1.07 12/04/2014 1248   CALCIUM 8.8 (L) 12/24/2015 1354   CALCIUM 8.6 (L) 12/03/2015 0456   CALCIUM 8.4 (L) 12/02/2015 0452   GFRNONAA 54 (L) 12/24/2015 1354   GFRNONAA >60 12/03/2015 0456   GFRNONAA >60 12/02/2015 0452   GFRAA >60 12/24/2015 1354   GFRAA >60 12/03/2015 0456   GFRAA >60 12/02/2015 0452   CMP     Component Value Date/Time   NA 136 12/24/2015 1354   K 3.7 12/24/2015 1354   CL 101 12/24/2015 1354   CO2 27 12/24/2015 1354   GLUCOSE 91 12/24/2015 1354   BUN 18 12/24/2015 1354   CREATININE 1.01 (H) 12/24/2015 1354   CREATININE 1.07 12/04/2014 1248   CALCIUM 8.8 (L) 12/24/2015 1354   PROT 6.5 11/29/2015 1349   ALBUMIN 3.4 (L) 11/29/2015 1349   AST 20 11/29/2015 1349   ALT 15 11/29/2015 1349   ALKPHOS 66 11/29/2015 1349   BILITOT 1.9 (H) 11/29/2015 1349   GFRNONAA 54 (L) 12/24/2015 1354   GFRAA >60 12/24/2015 1354       Component Value Date/Time   WBC 24.6 (H) 03/13/2016 1149   WBC 26.7 (H) 02/28/2016 1132   WBC 21.1 (H) 02/14/2016 1342   HGB 11.4 (L) 03/13/2016 1149   HGB 11.9 (L) 02/28/2016 1132   HGB 11.5 (L) 02/14/2016 1342   HCT 38.8 03/13/2016 1149   HCT 41.0 02/28/2016 1132   HCT 39.3 02/14/2016 1342   MCV 80.3 03/13/2016 1149   MCV 81.5 02/28/2016 1132   MCV 84.3 02/14/2016 1342    Lipid Panel     Component Value Date/Time   CHOL  07/19/2009 0448    119        ATP III CLASSIFICATION:  <200     mg/dL   Desirable  200-239  mg/dL   Borderline High  >=240    mg/dL   High          TRIG 124 07/19/2009 0448   HDL 32 (L) 07/19/2009 0448   CHOLHDL 3.7 07/19/2009 0448   VLDL 25 07/19/2009 0448   LDLCALC  07/19/2009 0448    62        Total Cholesterol/HDL:CHD Risk Coronary Heart Disease Risk Table                     Men   Women  1/2 Average Risk   3.4   3.3  Average Risk       5.0   4.4  2 X Average Risk   9.6   7.1  3 X Average Risk  23.4   11.0        Use the calculated Patient Ratio above and the CHD Risk  Table to determine the patient's CHD Risk.        ATP III CLASSIFICATION (LDL):  <100     mg/dL   Optimal  100-129  mg/dL   Near or Above                    Optimal  130-159  mg/dL   Borderline  160-189  mg/dL   High  >190     mg/dL   Very High    ABG    Component Value Date/Time   PHART 7.474 (H) 11/27/2014 1215   PCO2ART 32.5 (L) 11/27/2014 1215   PO2ART 49.6 (L) 11/27/2014 1215   HCO3 23.6 11/27/2014 1215   TCO2 20.3 11/27/2014 1215   O2SAT 84.8 11/27/2014 1215     Lab Results  Component Value Date   TSH 1.984 11/22/2014   BNP (last 3 results)  Recent Labs  11/21/15 1439 11/29/15 1349  BNP 815.0* 816.0*    ProBNP (last 3 results) No results for input(s): PROBNP in the last 8760 hours.  Cardiac Panel (last 3 results) No results for input(s): CKTOTAL, CKMB, TROPONINI, RELINDX in the last 72 hours.  Iron/TIBC/Ferritin/ %Sat    Component Value Date/Time   IRON 47 11/09/2014 1046   TIBC 349 11/09/2014 1046   FERRITIN 23 12/21/2014 1104   IRONPCTSAT 13 (L) 11/09/2014 1046     EKG Orders placed or performed during the hospital encounter of 11/29/15  . EKG     Prior Assessment and Plan Problem List as of 03/25/2016 Reviewed: 02/14/2016  3:24 PM by Robynn Pane, PA-C     Cardiovascular and Mediastinum   Chronic atrial fibrillation [facility])   Last Assessment & Plan 12/04/2014 Office Visit Written 12/04/2014 12:18 PM by Imogene Burn, PA-C    Rate better controlled on higher dose diltiazem. Continue metoprolol and Coumadin.      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 2-D echo showed normal LV function. Diastolic function could not be assessed but she had severely dilated right and left atrium PA pressure was 50 mmHg. She diuresed and is currently compensated. Will check renal function and potassium today. Continue current diuretics      CHF (congestive heart failure) (HCC)     Respiratory   Pleural effusion   Pleural effusion on left   Community acquired pneumonia   Acute on chronic respiratory failure with hypoxia (HCC)     Endocrine   Hypothyroidism     Nervous and Auditory   Hard of hearing     Musculoskeletal and Integument   Hip fracture, right (HCC)   Compartment syndrome, nontraumatic, lower extremity     Hematopoietic and Hemostatic   Thrombocytosis (HCC)     Other   Anticoagulant long-term use  Polycythemia vera [facility])   Last Assessment & Plan 02/14/2016 Office Visit Edited 02/14/2016  3:14 PM by Baird Cancer, PA-C    JAK 2 positive MPD, P. Vera. Currently on Hydrea 1000 mg daily M-F with a history of interruption of therapy secondary symptomatic pancytopenia.  Counts normalized with cessation of Hydrea, but thrombocytosis was noted.  She is not a candidate for Anagrelide given her cardiac issue(s).  Hydrea was restarted and she is very stable at the current dose of 1000 mg M-F with intermittent therapeutic phlebotomy.  This is complicated by chronic a-fib requiring anticoagulation, and COPD and chronic hypoxemia.  Labs today: CBC diff.  I personally reviewed and went over laboratory results with the patient.  The results are noted within this dictation.  She is noted to be anemic today.  Given her significant cardiac issues, it would be prudent to reduce her Hydrea dose with short-interval repeat CBC check.  We will decrease dose to 500 mg Monday-Friday.  Labs in 2 weeks: CBC  Labs in 4 weeks:  CBC  Labs in 8 weeks: CBC diff.  Return in 8 weeks for follow-up with possible dosing adjustments moving forward depending on response to decrease Hydrea dose.      Pain of right thigh   Wound infection after surgery   Swollen leg   Pain in limb   Aftercare following surgery of the circulatory system, NEC   Encounter for therapeutic drug monitoring   Dyspnea   Cough   Hypoxemia       Imaging: No results found.

## 2016-03-25 NOTE — Progress Notes (Signed)
Cardiology Office Note   Date:  03/25/2016   ID:  Charlotte [name], DOB 18-Nov-1940, MRN [id number]  PCP:  [name], MD  Cardiologist: Woodroe Chen, NP   Chief Complaint  Patient presents with  . Congestive Heart Failure  . Atrial Fibrillation      History of Present Illness: Charlotte [name] is a 75 y.o. female who presents for  ongoing assessment and management of chronic diastolic heart failure, atrial fibrillation on Coumadin, CHADS VASC 4, most recent echocardiogram April 2017 revealing EF of 60-65% with severely dilated left atrium. PA pressure 48 mm mercury.  She is also followed by Dr. Whitney Muse, oncology, for myeloproliferative disease and polycythemia vera.  She is here for 3 months follow up.   She comes today with continued DOE and is O2 Dependent. She is requesting renewal of her Disability sticker for her car. She is medically compliant and is being followed by PCP monthly along with our coumadin clinic in [place].    Past Medical History:  Diagnosis Date  . Arthritis   . Atrial fibrillation, chronic ([place])   . Deaf   . Diastolic heart failure ([place])   . DVT (deep venous thrombosis) ([place])   . Essential hypertension   . Hypothyroidism   . Peripheral neuropathy ([place])   . Peripheral vascular disease ([place])   . 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 Ulcer of ankle ([place])    Bilateral 2015  . Varicose veins     Past Surgical History:  Procedure Laterality Date  . ABDOMINAL AORTAGRAM N/A 09/14/2012   Procedure: ABDOMINAL [name];  Surgeon: Serafina Mitchell, MD;  Location: [facility] CATH LAB;  Service: Cardiovascular;  Laterality: N/A;  . ABDOMINAL HYSTERECTOMY    . CORONARY ANGIOPLASTY    . DRESSING CHANGE UNDER ANESTHESIA  12/02/2011   Procedure: DRESSING CHANGE UNDER ANESTHESIA;  Surgeon: Carole Civil, MD;  Location: [facility];  Service: Orthopedics;  Laterality: Right;  . ENDARTERECTOMY FEMORAL Right 09/15/2012   Procedure:  ENDARTERECTOMY FEMORAL;  Surgeon: Mal Misty, MD;  Location: [place];  Service: Vascular;  Laterality: Right;  . FASCIOTOMY  11/29/2011   Procedure: FASCIOTOMY;  Surgeon: Carole Civil, MD;  Location: [facility];  Service: Orthopedics;  Laterality: Right;  right thigh   . FEMORAL-POPLITEAL BYPASS GRAFT Right 09/15/2012   Procedure: BYPASS GRAFT FEMORAL-POPLITEAL ARTERY;  Surgeon: Mal Misty, MD;  Location: [place];  Service: Vascular;  Laterality: Right;  . HIP PINNING,CANNULATED  11/19/2011   Procedure: CANNULATED HIP PINNING;  Surgeon: Sanjuana Kava, MD;  Location: [facility];  Service: Orthopedics;  Laterality: Right;  . PATCH ANGIOPLASTY Right 09/15/2012   Procedure: PATCH ANGIOPLASTY;  Surgeon: Mal Misty, MD;  Location: [place];  Service: Vascular;  Laterality: Right;  . TUBAL LIGATION       Current Outpatient Prescriptions  Medication Sig Dispense Refill  . aspirin EC 81 MG tablet Take 81 mg by mouth daily.    Marland Kitchen CARTIA XT 240 MG 24 hr capsule Take 240 mg by mouth daily.     Marland Kitchen CARTIA XT 240 MG 24 hr capsule TAKE ONE CAPSULE BY MOUTH ONCE DAILY 90 capsule 3  . Cholecalciferol (VITAMIN D3) 5000 UNITS CAPS Take 1 capsule by mouth daily.    Marland Kitchen gabapentin (NEURONTIN) 100 MG capsule Take 100 mg by mouth 3 (three) times daily.      . hydroxyurea (HYDREA) 500 MG capsule TAKE ONE CAPSULE BY MOUTH TWICE DAILY MONDAY  THROUGH  FRIDAY  --  MAY  TAKE  WITH  FOOD  TO  MINIMIZE  GI  SIDE  EFFECTS 40 capsule 3  . levothyroxine (SYNTHROID, LEVOTHROID) 75 MCG tablet Take 75 mcg by mouth daily before breakfast.     . metoprolol (LOPRESSOR) 100 MG tablet Take 100 mg by mouth 2 (two) times daily.    . Potassium Chloride ER 20 MEQ TBCR Take 20 mEq by mouth daily. 30 tablet 1  . torsemide (DEMADEX) 20 MG tablet TAKE TWO TABLETS BY MOUTH ONCE DAILY 30 tablet 3  . warfarin (COUMADIN) 5 MG tablet TAKE AS DIRECTED BY  COUMADIN  CLINIC 30 tablet 4   No current facility-administered medications for this visit.      Allergies:   Review of patient's allergies indicates no known allergies.    Social History:  The patient  reports that she has never smoked. She has never used smokeless tobacco. She reports that she does not drink alcohol or use drugs.   Family History:  The patient's family history includes Diabetes in her son; Heart failure in her mother; Hypertension in her sister and sister; Stroke in her father and sister.    ROS: All other systems are reviewed and negative. Unless otherwise mentioned in H&P    PHYSICAL EXAM: VS:  Ht 6' (1.829 m)   Wt 170 lb (77.1 kg)   BMI 23.06 kg/m  , BMI Body mass index is 23.06 kg/m. GEN: Well nourished, well developed, in no acute distress  HEENT: normal  Neck: no JVD, carotid bruits, or masses Cardiac: IRRR; no murmurs, rubs, or gallops,no edema  Respiratory:  clear to auscultation bilaterally, increased work of breathing on O2.  GI: soft, nontender, nondistended, + BS MS: no deformity or atrophy Significant venous insufficiency with vascularity and stasis skin changes.  Skin: warm and dry, no rash Neuro:  Strength and sensation are intact Psych: euthymic mood, full affect   Recent Labs: 11/29/2015: ALT 15; B Natriuretic Peptide 816.0 12/01/2015: Magnesium 1.6 12/24/2015: BUN 18; Creatinine, Ser 1.01; Potassium 3.7; Sodium 136 03/13/2016: Hemoglobin 11.4; Platelets 949    Lipid Panel    Component Value Date/Time   CHOL  07/19/2009 0448    119        ATP III CLASSIFICATION:  <200     mg/dL   Desirable  200-239  mg/dL   Borderline High  >=240    mg/dL   High          TRIG 124 07/19/2009 0448   HDL 32 (L) 07/19/2009 0448   CHOLHDL 3.7 07/19/2009 0448   VLDL 25 07/19/2009 0448   LDLCALC  07/19/2009 0448    62        Total Cholesterol/HDL:CHD Risk Coronary Heart Disease Risk Table                     Men   Women  1/2 Average Risk   3.4   3.3  Average Risk       5.0   4.4  2 X Average Risk   9.6   7.1  3 X Average Risk  23.4   11.0         Use the calculated Patient Ratio above and the CHD Risk Table to determine the patient's CHD Risk.        ATP III CLASSIFICATION (LDL):  <100     mg/dL   Optimal  100-129  mg/dL   Near or Above  Optimal  130-159  mg/dL   Borderline  160-189  mg/dL   High  >190     mg/dL   Very High      Wt Readings from Last 3 Encounters:  03/25/16 170 lb (77.1 kg)  02/14/16 164 lb 14.4 oz (74.8 kg)  12/24/15 162 lb (73.5 kg)       ASSESSMENT AND PLAN:  1.  Chronic Diastolic CHF: She does not appear to be decompensated on exam. I have refilled her torsemide 20 mg BID, with 90 day supply and 3 refills. She continues to have NYHA Class III symptoms and is O2 dependent. I have renewed her disability seal for her car. Labs have just been drawn by PCP.  2. Atrial fibrillation: She is without complains of melena, or excessive bruising. She is being followed in our coumadin clinic once a month. Rate is well controlled on Cartia XT 240 mg daily.   3. Hypertension:  BP is well controlled currently. No changes in management,.    Current medicines are reviewed at length with the patient today.    Labs/ tests ordered today include:  No orders of the defined types were placed in this encounter.    Disposition:   FU with 4 months.   Signed, Jory Sims, NP  03/25/2016 1:00 PM    [facility] [address] [place], [place] [address] Phone: [phone]; Fax: [phone]

## 2016-03-25 NOTE — Patient Instructions (Signed)
Your physician recommends that you schedule a follow-up appointment in: 4 Months with Dr. Bronson Ing.   Your physician recommends that you continue on your current medications as directed. Please refer to the Current Medication list given to you today.  If you need a refill on your cardiac medications before your next appointment, please call your pharmacy.  Thank you for choosing [facility]!

## 2016-04-02 ENCOUNTER — Ambulatory Visit (INDEPENDENT_AMBULATORY_CARE_PROVIDER_SITE_OTHER): Payer: Medicare Other | Admitting: *Deleted

## 2016-04-02 DIAGNOSIS — I482 Chronic atrial fibrillation, unspecified: Secondary | ICD-10-CM

## 2016-04-02 DIAGNOSIS — Z5181 Encounter for therapeutic drug level monitoring: Secondary | ICD-10-CM | POA: Diagnosis not present

## 2016-04-02 LAB — POCT INR: INR: 2.7

## 2016-04-10 ENCOUNTER — Encounter (HOSPITAL_COMMUNITY): Payer: Self-pay | Admitting: Oncology

## 2016-04-10 ENCOUNTER — Encounter (HOSPITAL_COMMUNITY): Payer: Medicare Other | Attending: Oncology | Admitting: Oncology

## 2016-04-10 ENCOUNTER — Ambulatory Visit (HOSPITAL_COMMUNITY)
Admission: RE | Admit: 2016-04-10 | Discharge: 2016-04-10 | Disposition: A | Discharge status: Home / Self Care | Payer: Medicare Other | Source: Ambulatory Visit | Attending: Oncology | Admitting: Oncology

## 2016-04-10 ENCOUNTER — Encounter (HOSPITAL_COMMUNITY): Payer: Medicare Other

## 2016-04-10 VITALS — BP 107/57 | HR 62 | Temp 97.4°F | Resp 18

## 2016-04-10 DIAGNOSIS — R0902 Hypoxemia: Secondary | ICD-10-CM | POA: Diagnosis not present

## 2016-04-10 DIAGNOSIS — R059 Cough, unspecified: Secondary | ICD-10-CM

## 2016-04-10 DIAGNOSIS — D45 Polycythemia vera: Secondary | ICD-10-CM

## 2016-04-10 DIAGNOSIS — R05 Cough: Secondary | ICD-10-CM

## 2016-04-10 DIAGNOSIS — J90 Pleural effusion, not elsewhere classified: Secondary | ICD-10-CM | POA: Insufficient documentation

## 2016-04-10 DIAGNOSIS — J449 Chronic obstructive pulmonary disease, unspecified: Secondary | ICD-10-CM | POA: Diagnosis not present

## 2016-04-10 DIAGNOSIS — I517 Cardiomegaly: Secondary | ICD-10-CM | POA: Insufficient documentation

## 2016-04-10 DIAGNOSIS — Z7901 Long term (current) use of anticoagulants: Secondary | ICD-10-CM | POA: Insufficient documentation

## 2016-04-10 DIAGNOSIS — I482 Chronic atrial fibrillation: Secondary | ICD-10-CM | POA: Insufficient documentation

## 2016-04-10 LAB — CBC WITH DIFFERENTIAL/PLATELET
BASOS ABS: 0.3 10*3/uL — AB (ref 0.0–0.1)
BASOS PCT: 2 %
Eosinophils Absolute: 0.3 10*3/uL (ref 0.0–0.7)
Eosinophils Relative: 2 %
HEMATOCRIT: 40.3 % (ref 36.0–46.0)
HEMOGLOBIN: 11.7 g/dL — AB (ref 12.0–15.0)
Lymphocytes Relative: 5 %
Lymphs Abs: 1 10*3/uL (ref 0.7–4.0)
MCH: 23.8 pg — ABNORMAL LOW (ref 26.0–34.0)
MCHC: 29 g/dL — ABNORMAL LOW (ref 30.0–36.0)
MCV: 82.1 fL (ref 78.0–100.0)
Monocytes Absolute: 0.8 10*3/uL (ref 0.1–1.0)
Monocytes Relative: 4 %
NEUTROS ABS: 16.5 10*3/uL — AB (ref 1.7–7.7)
NEUTROS PCT: 87 %
Platelets: 833 10*3/uL — ABNORMAL HIGH (ref 150–400)
RBC: 4.91 MIL/uL (ref 3.87–5.11)
RDW: 25.5 % — AB (ref 11.5–15.5)
WBC: 18.9 10*3/uL — AB (ref 4.0–10.5)

## 2016-04-10 MED ORDER — HYDROXYUREA 500 MG PO CAPS: 1000.0000 mg | ORAL_CAPSULE | Freq: Every day | ORAL | 2 refills | Status: DC

## 2016-04-10 NOTE — Patient Instructions (Signed)
[place] at [facility] Discharge Instructions  RECOMMENDATIONS MADE BY THE CONSULTANT AND ANY TEST RESULTS WILL BE SENT TO YOUR REFERRING PHYSICIAN.  You were seen by [name] today. Continue Hydrea 1000mg  by mouth daily Labs every 2 weeks Return in 12 weeks for follow up  Thank you for choosing [place] at [facility] to provide your oncology and hematology care.  To afford each patient quality time with our provider, please arrive at least 15 minutes before your scheduled appointment time.   Beginning January 23rd 2017 lab work for the [facility] will be done in the  Main lab at [facility] on 1st floor. If you have a lab appointment with the [facility] please come in thru the  Main Entrance and check in at the main information desk  You need to re-schedule your appointment should you arrive 10 or more minutes late.  We strive to give you quality time with our providers, and arriving late affects you and other patients whose appointments are after yours.  Also, if you no show three or more times for appointments you may be dismissed from the clinic at the providers discretion.     Again, thank you for choosing [facility].  Our hope is that these requests will decrease the amount of time that you wait before being seen by our physicians.       _____________________________________________________________  Should you have questions after your visit to [name], please contact our office at [phone] between the hours of 8:30 a.m. and 4:30 p.m.  Voicemails left after 4:30 p.m. will not be returned until the following business day.  For prescription refill requests, have your pharmacy contact our office.         Resources For Cancer Patients and their Caregivers ? American Cancer Society: Can assist with transportation, wigs, general needs, runs Look Good Feel Better.        202-367-3180 ? Cancer  Care: Provides financial assistance, online support groups, medication/co-pay assistance.  1-800-813-HOPE (902)843-5057) ? [place] Assists [place] cancer patients and their families through emotional , educational and financial support.  (414) 410-2139 ? [place] DSS Where to apply for food stamps, Medicaid and utility assistance. (564) 472-9257 ? [facility]: Transportation to medical appointments. 343-419-5986 ? Social Security Administration: May apply for disability if have a Stage IV cancer. 915 167 5324 719-181-9068 ? [employer], Disability and Transit Services: Assists with nutrition, care and transit needs. [place] Support Programs: @10RELATIVEDAYS @ > Cancer Support Group  2nd Tuesday of the month 1pm-2pm, Journey Room  > Creative Journey  3rd Tuesday of the month 1130am-1pm, Journey Room  > Look Good Feel Better  1st Wednesday of the month 10am-12 noon, Journey Room (Call [place] to register [phone])

## 2016-04-10 NOTE — Assessment & Plan Note (Addendum)
JAK 2 positive MPD, P. Vera. Currently on Hydrea 1000 mg daily with a history of interruption of therapy secondary symptomatic pancytopenia.  Counts normalized with cessation of Hydrea, but thrombocytosis was noted.  She is not a candidate for Anagrelide given her cardiac issue(s).  This is complicated by chronic a-fib requiring anticoagulation, and COPD and chronic hypoxemia.  Labs today: CBC diff.  I personally reviewed and went over laboratory results with the patient.  The results are noted within this dictation.  She is noted to be minimally anemic today.  Platelet count has come down since 03/13/2016 when her Hydrea dose was decreased.  Labs every 2 weeks: CBC  She notes an intermittent coughing episode that causes her difficulty to catch her breath.  She denies any sputum production.  She denies any new medication.  She denies any fevers.  Order for Chest Xray is placed.  Return in 12 weeks for follow-up with possible dosing adjustments moving forward depending on response to decrease Hydrea dose.  Given her thrombocytosis and limited options moving forward regarding treatment, we may need to sacrifice some mild anemia in order to maintain platelet count at a reasonable level.

## 2016-04-10 NOTE — Progress Notes (Signed)
Charlotte Kilts, MD [place] [address]  Polycythemia vera [facility]) - Plan: hydroxyurea (HYDREA) 500 MG capsule, CBC  Cough - Plan: DG Chest 1 View  CURRENT THERAPY: Hydrea 1000 mg daily  INTERVAL HISTORY: Charlotte [name] 75 y.o. 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    From a hematologic standpoint, the patient is doing very well from a hematologic standpoint.  She denies any complaints or new complaints.    She is tolerating Hydrea very well.  She reports compliance to hydrea as well.  She notes an intermittent cough.  When it occurs, she has a difficult time catching her breath.  She denies any sputum production, fevers, chest pain, hemoptysis.  Review of Systems  Constitutional: Negative for chills, fever and weight loss.  HENT: Negative.   Eyes: Negative.   Respiratory: Positive for cough and shortness of breath (at baseline). Negative for hemoptysis and sputum production.   Cardiovascular: Negative.  Negative for chest pain.  Gastrointestinal: Negative.   Genitourinary: Negative.   Musculoskeletal: Negative.   Skin: Negative.  Negative for rash.  Neurological: Negative.   Endo/Heme/Allergies: Negative.   Psychiatric/Behavioral: Negative.     Past Medical History:  Diagnosis Date  . Arthritis   . Atrial fibrillation, chronic ([place])   . Deaf   . Diastolic heart failure ([place])   . DVT (deep venous thrombosis) ([place])   . Essential hypertension   . Hypothyroidism   . Peripheral neuropathy ([place])   . Peripheral vascular disease ([place])   . 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 Ulcer of ankle ([place])    Bilateral 2015  . Varicose veins     Past Surgical History:  Procedure Laterality Date  . ABDOMINAL AORTAGRAM N/A 09/14/2012   Procedure: ABDOMINAL [name];  Surgeon: Serafina Mitchell, MD;  Location: [facility] CATH LAB;  Service: Cardiovascular;   Laterality: N/A;  . ABDOMINAL HYSTERECTOMY    . CORONARY ANGIOPLASTY    . DRESSING CHANGE UNDER ANESTHESIA  12/02/2011   Procedure: DRESSING CHANGE UNDER ANESTHESIA;  Surgeon: Carole Civil, MD;  Location: [facility];  Service: Orthopedics;  Laterality: Right;  . ENDARTERECTOMY FEMORAL Right 09/15/2012   Procedure: ENDARTERECTOMY FEMORAL;  Surgeon: Mal Misty, MD;  Location: [place];  Service: Vascular;  Laterality: Right;  . FASCIOTOMY  11/29/2011   Procedure: FASCIOTOMY;  Surgeon: Carole Civil, MD;  Location: [facility];  Service: Orthopedics;  Laterality: Right;  right thigh   . FEMORAL-POPLITEAL BYPASS GRAFT Right 09/15/2012   Procedure: BYPASS GRAFT FEMORAL-POPLITEAL ARTERY;  Surgeon: Mal Misty, MD;  Location: [place];  Service: Vascular;  Laterality: Right;  . HIP PINNING,CANNULATED  11/19/2011   Procedure: CANNULATED HIP PINNING;  Surgeon: Sanjuana Kava, MD;  Location: [facility];  Service: Orthopedics;  Laterality: Right;  . PATCH ANGIOPLASTY Right 09/15/2012   Procedure: PATCH ANGIOPLASTY;  Surgeon: Mal Misty, MD;  Location: [place];  Service: Vascular;  Laterality: Right;  . TUBAL LIGATION      Family History  Problem Relation Age of Onset  . Heart failure Mother   . Stroke Father   . Diabetes Son   . Hypertension Sister   . Hypertension Sister   . Stroke Sister     Social History   Social History  . Marital status: Married    Spouse name: N/A  . Number of children: N/A  . Years of education: N/A  Social History Main Topics  . Smoking status: Never Smoker  . Smokeless tobacco: Never Used  . Alcohol use No  . Drug use: No  . Sexual activity: No   Other Topics Concern  . None   Social History Narrative  . None     PHYSICAL EXAMINATION  ECOG PERFORMANCE STATUS: 1 - Symptomatic but completely ambulatory  Vitals:   04/10/16 1348  BP: (!) 107/57  Pulse: 62  Resp: 18  Temp: 97.4 F (36.3 C)    GENERAL:alert, no distress, comfortable, cooperative,  smiling and accompanied by her husband SKIN: skin color, texture, turgor are normal, no rashes or significant lesions HEAD: Normocephalic, No masses, lesions, tenderness or abnormalities EYES: normal, PERRLA, Conjunctiva are pink and non-injected EARS: External ears normal OROPHARYNX:lips, buccal mucosa, and tongue normal and mucous membranes are moist  NECK: supple, trachea midline LYMPH:  not examined BREAST:not examined LUNGS: clear to auscultation, decreased breath sounds HEART: irregularly irregular ABDOMEN:abdomen soft and normal bowel sounds BACK: Back symmetric, no curvature. EXTREMITIES:less then 2 second capillary refill, no joint deformities, effusion, or inflammation, no skin discoloration, no cyanosis, positive findings:  Varicose veins in lower extremities.  NEURO: alert & oriented x 3 with fluent speech, no focal motor/sensory deficits, gait normal   LABORATORY DATA: CBC    Component Value Date/Time   WBC 18.9 (H) 04/10/2016 1322   RBC 4.91 04/10/2016 1322   HGB 11.7 (L) 04/10/2016 1322   HCT 40.3 04/10/2016 1322   PLT 833 (H) 04/10/2016 1322   MCV 82.1 04/10/2016 1322   MCH 23.8 (L) 04/10/2016 1322   MCHC 29.0 (L) 04/10/2016 1322   RDW 25.5 (H) 04/10/2016 1322   LYMPHSABS 1.0 04/10/2016 1322   MONOABS 0.8 04/10/2016 1322   EOSABS 0.3 04/10/2016 1322   BASOSABS 0.3 (H) 04/10/2016 1322      Chemistry      Component Value Date/Time   NA 136 12/24/2015 1354   K 3.7 12/24/2015 1354   CL 101 12/24/2015 1354   CO2 27 12/24/2015 1354   BUN 18 12/24/2015 1354   CREATININE 1.01 (H) 12/24/2015 1354   CREATININE 1.07 12/04/2014 1248      Component Value Date/Time   CALCIUM 8.8 (L) 12/24/2015 1354   ALKPHOS 66 11/29/2015 1349   AST 20 11/29/2015 1349   ALT 15 11/29/2015 1349   BILITOT 1.9 (H) 11/29/2015 1349        PENDING LABS:   RADIOGRAPHIC STUDIES:  No results found.   PATHOLOGY:    ASSESSMENT AND PLAN:  Polycythemia vera ([place]) JAK 2  positive MPD, P. Vera. Currently on Hydrea 1000 mg daily with a history of interruption of therapy secondary symptomatic pancytopenia.  Counts normalized with cessation of Hydrea, but thrombocytosis was noted.  She is not a candidate for Anagrelide given her cardiac issue(s).  This is complicated by chronic a-fib requiring anticoagulation, and COPD and chronic hypoxemia.  Labs today: CBC diff.  I personally reviewed and went over laboratory results with the patient.  The results are noted within this dictation.  She is noted to be minimally anemic today.  Platelet count has come down since 03/13/2016 when her Hydrea dose was decreased.  Labs every 2 weeks: CBC  She notes an intermittent coughing episode that causes her difficulty to catch her breath.  She denies any sputum production.  She denies any new medication.  She denies any fevers.  Order for Chest Xray is placed.  Return in 12 weeks for follow-up with  possible dosing adjustments moving forward depending on response to decrease Hydrea dose.  Given her thrombocytosis and limited options moving forward regarding treatment, we may need to sacrifice some mild anemia in order to maintain platelet count at a reasonable level.    ORDERS PLACED FOR THIS ENCOUNTER: Orders Placed This Encounter  Procedures  . DG Chest 1 View  . CBC    MEDICATIONS PRESCRIBED THIS ENCOUNTER: Meds ordered this encounter  Medications  . hydroxyurea (HYDREA) 500 MG capsule    Sig: Take 2 capsules (1,000 mg total) by mouth daily. May take with food to minimize GI side effects.    Dispense:  60 capsule    Refill:  2    Order Specific Question:   Supervising Provider    Answer:   Patrici Ranks [id number]    THERAPY PLAN:  Continue with Hydrea as planned with outlined dosing adjustment.   All questions were answered. The patient knows to call the clinic with any problems, questions or concerns. We can certainly see the patient much sooner if  necessary.  Patient and plan discussed with Dr. Ancil Linsey and she is in agreement with the aforementioned.   This note is electronically signed by: Doy Mince 04/10/2016 2:27 PM

## 2016-04-11 ENCOUNTER — Telehealth (HOSPITAL_COMMUNITY): Payer: Self-pay | Admitting: *Deleted

## 2016-04-11 NOTE — Telephone Encounter (Signed)
Pt aware chest xray is stable.

## 2016-04-11 NOTE — Telephone Encounter (Signed)
-----   Message from Baird Cancer, PA-C sent at 04/11/2016 10:55 AM EDT ----- Stable.  Discussed with cardiology NP.  No obvious cause for her cough.

## 2016-04-24 ENCOUNTER — Encounter (HOSPITAL_COMMUNITY): Payer: Medicare Other

## 2016-04-24 DIAGNOSIS — D45 Polycythemia vera: Secondary | ICD-10-CM

## 2016-04-24 DIAGNOSIS — Z7901 Long term (current) use of anticoagulants: Secondary | ICD-10-CM | POA: Diagnosis not present

## 2016-04-24 DIAGNOSIS — I482 Chronic atrial fibrillation: Secondary | ICD-10-CM | POA: Diagnosis not present

## 2016-04-24 DIAGNOSIS — J449 Chronic obstructive pulmonary disease, unspecified: Secondary | ICD-10-CM | POA: Diagnosis not present

## 2016-04-24 DIAGNOSIS — R0902 Hypoxemia: Secondary | ICD-10-CM | POA: Diagnosis not present

## 2016-04-24 LAB — CBC
HCT: 38.9 % (ref 36.0–46.0)
HEMOGLOBIN: 11.6 g/dL — AB (ref 12.0–15.0)
MCH: 24.9 pg — AB (ref 26.0–34.0)
MCHC: 29.8 g/dL — AB (ref 30.0–36.0)
MCV: 83.5 fL (ref 78.0–100.0)
Platelets: 57 10*3/uL — ABNORMAL LOW (ref 150–400)
RBC: 4.66 MIL/uL (ref 3.87–5.11)
RDW: 27.6 % — ABNORMAL HIGH (ref 11.5–15.5)
WBC: 16.8 10*3/uL — ABNORMAL HIGH (ref 4.0–10.5)

## 2016-04-25 ENCOUNTER — Encounter (HOSPITAL_COMMUNITY): Payer: Medicare Other

## 2016-04-25 DIAGNOSIS — D45 Polycythemia vera: Secondary | ICD-10-CM | POA: Diagnosis not present

## 2016-04-25 DIAGNOSIS — Z7901 Long term (current) use of anticoagulants: Secondary | ICD-10-CM | POA: Diagnosis not present

## 2016-04-25 DIAGNOSIS — J449 Chronic obstructive pulmonary disease, unspecified: Secondary | ICD-10-CM | POA: Diagnosis not present

## 2016-04-25 DIAGNOSIS — I482 Chronic atrial fibrillation: Secondary | ICD-10-CM | POA: Diagnosis not present

## 2016-04-25 DIAGNOSIS — R0902 Hypoxemia: Secondary | ICD-10-CM | POA: Diagnosis not present

## 2016-04-25 LAB — CBC WITH DIFFERENTIAL/PLATELET
BASOS ABS: 0.4 10*3/uL — AB (ref 0.0–0.1)
Basophils Relative: 2 %
EOS ABS: 0.4 10*3/uL (ref 0.0–0.7)
EOS PCT: 2 %
HCT: 42.1 % (ref 36.0–46.0)
HEMOGLOBIN: 12.4 g/dL (ref 12.0–15.0)
LYMPHS ABS: 2.3 10*3/uL (ref 0.7–4.0)
Lymphocytes Relative: 12 %
MCH: 25.1 pg — AB (ref 26.0–34.0)
MCHC: 29.5 g/dL — AB (ref 30.0–36.0)
MCV: 85.1 fL (ref 78.0–100.0)
MONO ABS: 0.6 10*3/uL (ref 0.1–1.0)
Monocytes Relative: 3 %
NEUTROS PCT: 81 %
Neutro Abs: 15.6 10*3/uL — ABNORMAL HIGH (ref 1.7–7.7)
Platelets: 84 10*3/uL — ABNORMAL LOW (ref 150–400)
RBC: 4.95 MIL/uL (ref 3.87–5.11)
RDW: 27.8 % — AB (ref 11.5–15.5)
WBC: 19.3 10*3/uL — AB (ref 4.0–10.5)

## 2016-04-26 ENCOUNTER — Other Ambulatory Visit (HOSPITAL_COMMUNITY): Payer: Self-pay | Admitting: Oncology

## 2016-04-26 DIAGNOSIS — D45 Polycythemia vera: Secondary | ICD-10-CM

## 2016-04-30 ENCOUNTER — Ambulatory Visit (INDEPENDENT_AMBULATORY_CARE_PROVIDER_SITE_OTHER): Payer: Medicare Other | Admitting: *Deleted

## 2016-04-30 DIAGNOSIS — I482 Chronic atrial fibrillation, unspecified: Secondary | ICD-10-CM

## 2016-04-30 DIAGNOSIS — Z5181 Encounter for therapeutic drug level monitoring: Secondary | ICD-10-CM

## 2016-04-30 LAB — POCT INR: INR: 1.7

## 2016-05-01 ENCOUNTER — Other Ambulatory Visit (HOSPITAL_COMMUNITY): Payer: Medicare Other

## 2016-05-01 DIAGNOSIS — H3530 Unspecified macular degeneration: Secondary | ICD-10-CM | POA: Diagnosis not present

## 2016-05-02 ENCOUNTER — Encounter (HOSPITAL_COMMUNITY): Payer: Medicare Other

## 2016-05-02 DIAGNOSIS — D45 Polycythemia vera: Secondary | ICD-10-CM

## 2016-05-02 DIAGNOSIS — I482 Chronic atrial fibrillation: Secondary | ICD-10-CM | POA: Diagnosis not present

## 2016-05-02 DIAGNOSIS — J449 Chronic obstructive pulmonary disease, unspecified: Secondary | ICD-10-CM | POA: Diagnosis not present

## 2016-05-02 DIAGNOSIS — Z7901 Long term (current) use of anticoagulants: Secondary | ICD-10-CM | POA: Diagnosis not present

## 2016-05-02 DIAGNOSIS — R0902 Hypoxemia: Secondary | ICD-10-CM | POA: Diagnosis not present

## 2016-05-02 LAB — CBC
HCT: 40.4 % (ref 36.0–46.0)
Hemoglobin: 11.6 g/dL — ABNORMAL LOW (ref 12.0–15.0)
MCH: 24.5 pg — ABNORMAL LOW (ref 26.0–34.0)
MCHC: 28.7 g/dL — ABNORMAL LOW (ref 30.0–36.0)
MCV: 85.2 fL (ref 78.0–100.0)
PLATELETS: 743 10*3/uL — AB (ref 150–400)
RBC: 4.74 MIL/uL (ref 3.87–5.11)
RDW: 28.9 % — ABNORMAL HIGH (ref 11.5–15.5)
WBC: 21 10*3/uL — AB (ref 4.0–10.5)

## 2016-05-05 ENCOUNTER — Other Ambulatory Visit (HOSPITAL_COMMUNITY): Payer: Self-pay | Admitting: Oncology

## 2016-05-05 DIAGNOSIS — D45 Polycythemia vera: Secondary | ICD-10-CM

## 2016-05-06 ENCOUNTER — Telehealth (HOSPITAL_COMMUNITY): Payer: Self-pay | Admitting: *Deleted

## 2016-05-06 NOTE — Telephone Encounter (Signed)
-----   Message from Baird Cancer, PA-C sent at 05/05/2016  5:04 PM EDT ----- Continue same dose.  Repeat CBC in ~1-2 weeks.  Order is in.

## 2016-05-06 NOTE — Telephone Encounter (Signed)
Pt aware of labs and to continue taking the same dose as now. Pt also aware that she will need labs in 1-2 weeks. Pt has an appointment for the 5th that will need to moved to the beginning of next week.

## 2016-05-08 ENCOUNTER — Other Ambulatory Visit (HOSPITAL_COMMUNITY): Payer: Medicare Other

## 2016-05-13 DIAGNOSIS — Z23 Encounter for immunization: Secondary | ICD-10-CM | POA: Diagnosis not present

## 2016-05-14 ENCOUNTER — Encounter (HOSPITAL_COMMUNITY): Payer: Medicare Other | Attending: Oncology

## 2016-05-14 ENCOUNTER — Ambulatory Visit (INDEPENDENT_AMBULATORY_CARE_PROVIDER_SITE_OTHER): Payer: Medicare Other | Admitting: *Deleted

## 2016-05-14 DIAGNOSIS — R0902 Hypoxemia: Secondary | ICD-10-CM | POA: Diagnosis not present

## 2016-05-14 DIAGNOSIS — Z5181 Encounter for therapeutic drug level monitoring: Secondary | ICD-10-CM | POA: Diagnosis not present

## 2016-05-14 DIAGNOSIS — I482 Chronic atrial fibrillation, unspecified: Secondary | ICD-10-CM

## 2016-05-14 DIAGNOSIS — D45 Polycythemia vera: Secondary | ICD-10-CM | POA: Insufficient documentation

## 2016-05-14 DIAGNOSIS — Z7901 Long term (current) use of anticoagulants: Secondary | ICD-10-CM | POA: Insufficient documentation

## 2016-05-14 DIAGNOSIS — J449 Chronic obstructive pulmonary disease, unspecified: Secondary | ICD-10-CM | POA: Insufficient documentation

## 2016-05-14 LAB — POCT INR: INR: 1.6

## 2016-05-14 LAB — CBC
HCT: 40.5 % (ref 36.0–46.0)
HEMOGLOBIN: 11.8 g/dL — AB (ref 12.0–15.0)
MCH: 24.5 pg — AB (ref 26.0–34.0)
MCHC: 29.1 g/dL — ABNORMAL LOW (ref 30.0–36.0)
MCV: 84.2 fL (ref 78.0–100.0)
PLATELETS: 580 10*3/uL — AB (ref 150–400)
RBC: 4.81 MIL/uL (ref 3.87–5.11)
RDW: 26.8 % — ABNORMAL HIGH (ref 11.5–15.5)
WBC: 20.2 10*3/uL — AB (ref 4.0–10.5)

## 2016-05-22 ENCOUNTER — Encounter (HOSPITAL_COMMUNITY): Payer: Medicare Other

## 2016-05-22 DIAGNOSIS — D45 Polycythemia vera: Secondary | ICD-10-CM | POA: Diagnosis not present

## 2016-05-22 DIAGNOSIS — J449 Chronic obstructive pulmonary disease, unspecified: Secondary | ICD-10-CM | POA: Diagnosis not present

## 2016-05-22 DIAGNOSIS — Z7901 Long term (current) use of anticoagulants: Secondary | ICD-10-CM | POA: Diagnosis not present

## 2016-05-22 DIAGNOSIS — I482 Chronic atrial fibrillation: Secondary | ICD-10-CM | POA: Diagnosis not present

## 2016-05-22 DIAGNOSIS — R0902 Hypoxemia: Secondary | ICD-10-CM | POA: Diagnosis not present

## 2016-05-22 LAB — CBC
HEMATOCRIT: 41.6 % (ref 36.0–46.0)
HEMOGLOBIN: 12.2 g/dL (ref 12.0–15.0)
MCH: 24.9 pg — AB (ref 26.0–34.0)
MCHC: 29.3 g/dL — ABNORMAL LOW (ref 30.0–36.0)
MCV: 84.9 fL (ref 78.0–100.0)
Platelets: 470 10*3/uL — ABNORMAL HIGH (ref 150–400)
RBC: 4.9 MIL/uL (ref 3.87–5.11)
RDW: 26 % — ABNORMAL HIGH (ref 11.5–15.5)
WBC: 31.2 10*3/uL — ABNORMAL HIGH (ref 4.0–10.5)

## 2016-05-26 ENCOUNTER — Ambulatory Visit (INDEPENDENT_AMBULATORY_CARE_PROVIDER_SITE_OTHER): Payer: Medicare Other | Admitting: *Deleted

## 2016-05-26 DIAGNOSIS — I482 Chronic atrial fibrillation, unspecified: Secondary | ICD-10-CM

## 2016-05-26 DIAGNOSIS — Z5181 Encounter for therapeutic drug level monitoring: Secondary | ICD-10-CM | POA: Diagnosis not present

## 2016-05-26 LAB — POCT INR: INR: 3.2

## 2016-06-05 ENCOUNTER — Encounter (HOSPITAL_COMMUNITY): Payer: Medicare Other | Attending: Hematology & Oncology

## 2016-06-05 DIAGNOSIS — D45 Polycythemia vera: Secondary | ICD-10-CM | POA: Insufficient documentation

## 2016-06-05 LAB — CBC
HCT: 39.3 % (ref 36.0–46.0)
Hemoglobin: 11.6 g/dL — ABNORMAL LOW (ref 12.0–15.0)
MCH: 24.3 pg — AB (ref 26.0–34.0)
MCHC: 29.5 g/dL — AB (ref 30.0–36.0)
MCV: 82.4 fL (ref 78.0–100.0)
Platelets: 868 10*3/uL — ABNORMAL HIGH (ref 150–400)
RBC: 4.77 MIL/uL (ref 3.87–5.11)
RDW: 24.6 % — AB (ref 11.5–15.5)
WBC: 26.8 10*3/uL — ABNORMAL HIGH (ref 4.0–10.5)

## 2016-06-12 ENCOUNTER — Emergency Department (HOSPITAL_COMMUNITY)
Admission: EM | Admit: 2016-06-12 | Discharge: 2016-06-12 | Disposition: A | Discharge status: Home / Self Care | Payer: Medicare Other | Attending: Emergency Medicine | Admitting: Emergency Medicine

## 2016-06-12 ENCOUNTER — Emergency Department (HOSPITAL_COMMUNITY): Payer: Medicare Other

## 2016-06-12 ENCOUNTER — Encounter (HOSPITAL_COMMUNITY): Payer: Self-pay | Admitting: Emergency Medicine

## 2016-06-12 DIAGNOSIS — X58XXXA Exposure to other specified factors, initial encounter: Secondary | ICD-10-CM | POA: Diagnosis not present

## 2016-06-12 DIAGNOSIS — E039 Hypothyroidism, unspecified: Secondary | ICD-10-CM | POA: Insufficient documentation

## 2016-06-12 DIAGNOSIS — Z79899 Other long term (current) drug therapy: Secondary | ICD-10-CM | POA: Insufficient documentation

## 2016-06-12 DIAGNOSIS — Y929 Unspecified place or not applicable: Secondary | ICD-10-CM | POA: Insufficient documentation

## 2016-06-12 DIAGNOSIS — Y999 Unspecified external cause status: Secondary | ICD-10-CM | POA: Insufficient documentation

## 2016-06-12 DIAGNOSIS — Y939 Activity, unspecified: Secondary | ICD-10-CM | POA: Diagnosis not present

## 2016-06-12 DIAGNOSIS — I5033 Acute on chronic diastolic (congestive) heart failure: Secondary | ICD-10-CM | POA: Insufficient documentation

## 2016-06-12 DIAGNOSIS — Z7901 Long term (current) use of anticoagulants: Secondary | ICD-10-CM | POA: Insufficient documentation

## 2016-06-12 DIAGNOSIS — Z7982 Long term (current) use of aspirin: Secondary | ICD-10-CM | POA: Diagnosis not present

## 2016-06-12 DIAGNOSIS — I110 Hypertensive heart disease with heart failure: Secondary | ICD-10-CM | POA: Insufficient documentation

## 2016-06-12 DIAGNOSIS — L03116 Cellulitis of left lower limb: Secondary | ICD-10-CM | POA: Diagnosis not present

## 2016-06-12 DIAGNOSIS — S91002A Unspecified open wound, left ankle, initial encounter: Secondary | ICD-10-CM | POA: Diagnosis present

## 2016-06-12 DIAGNOSIS — J90 Pleural effusion, not elsewhere classified: Secondary | ICD-10-CM | POA: Diagnosis not present

## 2016-06-12 LAB — CBC WITH DIFFERENTIAL/PLATELET
Basophils Absolute: 0.2 10*3/uL — ABNORMAL HIGH (ref 0.0–0.1)
Basophils Relative: 2 %
Eosinophils Absolute: 0.2 10*3/uL (ref 0.0–0.7)
Eosinophils Relative: 2 %
HEMATOCRIT: 40.2 % (ref 36.0–46.0)
HEMOGLOBIN: 11.6 g/dL — AB (ref 12.0–15.0)
LYMPHS ABS: 0.5 10*3/uL — AB (ref 0.7–4.0)
Lymphocytes Relative: 5 %
MCH: 23.7 pg — AB (ref 26.0–34.0)
MCHC: 28.9 g/dL — AB (ref 30.0–36.0)
MCV: 82.2 fL (ref 78.0–100.0)
MONO ABS: 0.4 10*3/uL (ref 0.1–1.0)
MONOS PCT: 4 %
NEUTROS ABS: 9.3 10*3/uL — AB (ref 1.7–7.7)
NEUTROS PCT: 87 %
Platelets: 237 10*3/uL (ref 150–400)
RBC: 4.89 MIL/uL (ref 3.87–5.11)
RDW: 23.4 % — AB (ref 11.5–15.5)
WBC: 10.6 10*3/uL — ABNORMAL HIGH (ref 4.0–10.5)

## 2016-06-12 LAB — COMPREHENSIVE METABOLIC PANEL
ALK PHOS: 92 U/L (ref 38–126)
ALT: 9 U/L — ABNORMAL LOW (ref 14–54)
ANION GAP: 7 (ref 5–15)
AST: 14 U/L — ABNORMAL LOW (ref 15–41)
Albumin: 3.5 g/dL (ref 3.5–5.0)
BUN: 24 mg/dL — ABNORMAL HIGH (ref 6–20)
CALCIUM: 8.8 mg/dL — AB (ref 8.9–10.3)
CO2: 28 mmol/L (ref 22–32)
Chloride: 102 mmol/L (ref 101–111)
Creatinine, Ser: 1.15 mg/dL — ABNORMAL HIGH (ref 0.44–1.00)
GFR, EST AFRICAN AMERICAN: 53 mL/min — AB (ref >60)
GFR, EST NON AFRICAN AMERICAN: 46 mL/min — AB (ref >60)
GLUCOSE: 84 mg/dL (ref 65–99)
POTASSIUM: 4.6 mmol/L (ref 3.5–5.1)
Sodium: 137 mmol/L (ref 135–145)
TOTAL PROTEIN: 6.4 g/dL — AB (ref 6.5–8.1)
Total Bilirubin: 1.7 mg/dL — ABNORMAL HIGH (ref 0.3–1.2)

## 2016-06-12 LAB — PROTIME-INR
INR: 2.07
PROTHROMBIN TIME: 23.7 s — AB (ref 11.4–15.2)

## 2016-06-12 MED ORDER — CLINDAMYCIN HCL 300 MG PO CAPS: 300.0000 mg | ORAL_CAPSULE | Freq: Four times a day (QID) | ORAL | 0 refills | Status: DC

## 2016-06-12 MED ORDER — VANCOMYCIN HCL IN DEXTROSE 1-5 GM/200ML-% IV SOLN
1000.0000 mg | Freq: Once | INTRAVENOUS | Status: AC
  Administered 2016-06-12: 1000 mg via INTRAVENOUS
  Filled 2016-06-12: qty 200

## 2016-06-12 NOTE — ED Triage Notes (Signed)
Patient complaining of wound infection to left foot "for months." States "My doctor gave me a shot of antibiotic a couple of months ago and I went to see him today but he couldn't see me." Patient O2 saturation 84% in triage. States "I'm on oxygen sometimes, 3L, but I haven't used it this morning at all." Denies shortness of breath.

## 2016-06-12 NOTE — Discharge Instructions (Signed)
Follow-up with your family doctor next week. Return sooner if your foot is worse

## 2016-06-12 NOTE — ED Notes (Signed)
Pt taken to xray. nad 

## 2016-06-12 NOTE — ED Provider Notes (Signed)
[facility] DEPT Provider Note   CSN: [id number] Arrival date & time: 06/12/16  8657  By signing my name below, I, [name], attest that this documentation has been prepared under the direction and in the presence of Milton Ferguson, MD. Electronically Signed: [name], ED Scribe. 06/12/16. 10:31 AM.   History   Chief Complaint Chief Complaint  Patient presents with  . Wound Infection    HPI HPI Comments: Charlotte [name] is a 75 y.o. female who presents to the Emergency Department complaining of a worsening wound to her left ankle which has been an ongoing issue for a few months. She states she was initially evaluated and given IM abx and a topical antibiotic w/o significant relief. She applied topical ointment to it this morning. Pt reports associated clear drainage and moderate pain across the wound. She is on home O2 intermittently. No other associated symptoms at this time.   The history is provided by the patient. No language interpreter was used.  Rash     - Past Medical History:  Diagnosis Date  . Arthritis   . Atrial fibrillation, chronic ([place])   . Deaf   . Diastolic heart failure ([place])   . DVT (deep venous thrombosis) ([place])   . Essential hypertension   . Hypothyroidism   . Peripheral neuropathy ([place])   . Peripheral vascular disease ([place])   . 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 Ulcer of ankle ([place])    Bilateral 2015  . Varicose veins     Patient Active Problem List   Diagnosis Date Noted  . Cough 11/29/2015  . Hypoxemia 11/29/2015  . Community acquired pneumonia 11/29/2015  . CHF (congestive heart failure) ([place]) 11/29/2015  . Acute on chronic respiratory failure with hypoxia ([place])   . Pleural effusion on left   . Acute on chronic diastolic (congestive) heart failure   . Dyspnea 11/22/2014  . Pleural effusion 11/22/2014  . Hard of hearing 11/22/2014  . Thrombocytosis ([place]) 11/22/2014  . Encounter for therapeutic drug monitoring 08/31/2013   . Aftercare following surgery of the circulatory system, [place] 12/21/2012  . Pain in limb 10/19/2012  . Peripheral vascular disease ([place]) 09/24/2012  . Swollen leg 09/24/2012  . Varicose veins of lower extremities with other complications 84/69/6295  . 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 Wound infection after surgery 01/07/2012  . Compartment syndrome, nontraumatic, lower extremity 11/29/2011  . Pain of right thigh 11/28/2011  . Hip fracture, right ([place]) 11/15/2011  . Chronic atrial fibrillation ([place]) 11/15/2011  . Anticoagulant long-term use 11/15/2011  . HTN (hypertension) 11/15/2011  . Hypothyroidism 11/15/2011  . Polycythemia vera ([place]) 10/01/2011    Past Surgical History:  Procedure Laterality Date  . ABDOMINAL AORTAGRAM N/A 09/14/2012   Procedure: ABDOMINAL [name];  Surgeon: Serafina Mitchell, MD;  Location: [facility] CATH LAB;  Service: Cardiovascular;  Laterality: N/A;  . ABDOMINAL HYSTERECTOMY    . CORONARY ANGIOPLASTY    . DRESSING CHANGE UNDER ANESTHESIA  12/02/2011   Procedure: DRESSING CHANGE UNDER ANESTHESIA;  Surgeon: Carole Civil, MD;  Location: [facility];  Service: Orthopedics;  Laterality: Right;  . ENDARTERECTOMY FEMORAL Right 09/15/2012   Procedure: ENDARTERECTOMY FEMORAL;  Surgeon: Mal Misty, MD;  Location: [facility];  Service: Vascular;  Laterality: Right;  . FASCIOTOMY  11/29/2011   Procedure: FASCIOTOMY;  Surgeon: Carole Civil, MD;  Location: [facility];  Service: Orthopedics;  Laterality: Right;  right  thigh   . FEMORAL-POPLITEAL BYPASS GRAFT Right 09/15/2012   Procedure: BYPASS GRAFT FEMORAL-POPLITEAL ARTERY;  Surgeon: Mal Misty, MD;  Location: [facility];  Service: Vascular;  Laterality: Right;  . HIP PINNING,CANNULATED  11/19/2011   Procedure: CANNULATED HIP PINNING;  Surgeon: Sanjuana Kava, MD;  Location: [facility];  Service: Orthopedics;  Laterality: Right;  . PATCH  ANGIOPLASTY Right 09/15/2012   Procedure: PATCH ANGIOPLASTY;  Surgeon: Mal Misty, MD;  Location: [facility];  Service: Vascular;  Laterality: Right;  . TUBAL LIGATION      OB History    No data available       Home Medications    Prior to Admission medications   Medication Sig Start Date End Date Taking? Authorizing Provider  aspirin EC 81 MG tablet Take 81 mg by mouth daily.    Historical Provider, MD  CARTIA XT 240 MG 24 hr capsule Take 240 mg by mouth daily.  06/18/15   Historical Provider, MD  CARTIA XT 240 MG 24 hr capsule TAKE ONE CAPSULE BY MOUTH ONCE DAILY 12/24/15   Lendon Colonel, NP  Cholecalciferol (VITAMIN D3) 5000 UNITS CAPS Take 1 capsule by mouth daily.    Historical Provider, MD  gabapentin (NEURONTIN) 100 MG capsule Take 100 mg by mouth 3 (three) times daily.      Historical Provider, MD  hydroxyurea (HYDREA) 500 MG capsule Take 2 capsules (1,000 mg total) by mouth daily. May take with food to minimize GI side effects. 04/10/16   Baird Cancer, PA-C  levothyroxine (SYNTHROID, LEVOTHROID) 75 MCG tablet Take 75 mcg by mouth daily before breakfast.  10/16/14   Historical Provider, MD  metoprolol (LOPRESSOR) 100 MG tablet Take 100 mg by mouth 2 (two) times daily. 06/05/15   Historical Provider, MD  Potassium Chloride ER 20 MEQ TBCR Take 20 mEq by mouth daily. 11/29/14   Radene Gunning, NP  torsemide (DEMADEX) 20 MG tablet Take 2 tablets (40 mg total) by mouth daily. 03/25/16   Lendon Colonel, NP  warfarin (COUMADIN) 5 MG tablet TAKE AS DIRECTED BY  COUMADIN  CLINIC 01/01/16   Herminio Commons, MD    Family History Family History  Problem Relation Age of Onset  . Heart failure Mother   . Stroke Father   . Diabetes Son   . Hypertension Sister   . Hypertension Sister   . Stroke Sister     Social History Social History  Substance Use Topics  . Smoking status: Never Smoker  . Smokeless tobacco: Never Used  . Alcohol use No     Allergies   Patient has no  known allergies.   Review of Systems Review of Systems  Constitutional: Negative for appetite change and fatigue.  HENT: Negative for congestion, ear discharge and sinus pressure.   Eyes: Negative for discharge.  Respiratory: Negative for cough.   Cardiovascular: Negative for chest pain.  Gastrointestinal: Negative for abdominal pain and diarrhea.  Genitourinary: Negative for frequency and hematuria.  Musculoskeletal: Positive for arthralgias. Negative for back pain.  Skin: Positive for color change and wound. Negative for rash.  Neurological: Negative for seizures and headaches.  Psychiatric/Behavioral: Negative for hallucinations.   Physical Exam Updated Vital Signs BP 121/72 (BP Location: Right Arm)   Pulse 81   Temp 97.5 F (36.4 C) (Oral)   Resp 24   Ht 6' (1.829 m)   Wt 164 lb (74.4 kg)   SpO2 (!) 84%   BMI 22.24 kg/m  Physical Exam  Constitutional: She is oriented to person, place, and time. She appears well-developed.  HENT:  Head: Normocephalic.  Eyes: Conjunctivae and EOM are normal. No scleral icterus.  Neck: Neck supple. No thyromegaly present.  Cardiovascular: Normal rate and regular rhythm.  Exam reveals no gallop and no friction rub.   No murmur heard. Pulmonary/Chest: No stridor. She has no wheezes. She has no rales. She exhibits no tenderness.  Abdominal: She exhibits no distension. There is no tenderness. There is no rebound.  Musculoskeletal: Normal range of motion. She exhibits no edema.  Lateral left ankle with mild swelling, scaling rash and minimal tenderness  Lymphadenopathy:    She has no cervical adenopathy.  Neurological: She is oriented to person, place, and time. She exhibits normal muscle tone. Coordination normal.  Neurovascularly intact  Skin: No rash noted. No erythema.  Psychiatric: She has a normal mood and affect. Her behavior is normal.   ED Treatments / Results  Labs (all labs ordered are listed, but only abnormal results are  displayed) Labs Reviewed  CBC WITH DIFFERENTIAL/PLATELET - Abnormal; Notable for the following:       Result Value   WBC 10.6 (*)    Hemoglobin 11.6 (*)    MCH 23.7 (*)    MCHC 28.9 (*)    RDW 23.4 (*)    Neutro Abs 21.3 (*)    Basophils Absolute 0.4 (*)    All other components within normal limits  COMPREHENSIVE METABOLIC PANEL - Abnormal; Notable for the following:    BUN 24 (*)    Creatinine, Ser 1.15 (*)    Calcium 8.8 (*)    Total Protein 6.4 (*)    AST 14 (*)    ALT 9 (*)    Total Bilirubin 1.7 (*)    GFR calc non Af Amer 46 (*)    GFR calc Af Amer 53 (*)    All other components within normal limits  PROTIME-INR - Abnormal; Notable for the following:    Prothrombin Time 23.7 (*)    All other components within normal limits    EKG  EKG Interpretation None       Radiology No results found.  Procedures Procedures  COORDINATION OF CARE: 10:30 AM Discussed next steps with pt. Pt verbalized understanding and is agreeable with the plan.    Medications Ordered in ED Medications - No data to display   Initial Impression / Assessment and Plan / ED Course  I have reviewed the triage vital signs and the nursing notes.  Pertinent labs & imaging results that were available during my care of the patient were reviewed by me and considered in my medical decision making (see chart for details).  Clinical Course     Cellulitis to left foot. Patient given IV antibiotics here. She'll be discharged home on clindamycin and will follow-up with her PCP  The chart was scribed for me under my direct supervision.  I personally performed the history, physical, and medical decision making and all procedures in the evaluation of this patient..  Final Clinical Impressions(s) / ED Diagnoses   Final diagnoses:  None    New Prescriptions New Prescriptions   No medications on file     Milton Ferguson, MD 06/12/16 1526

## 2016-06-12 NOTE — ED Notes (Signed)
Placed patient on 3L O2, oxygen saturation increased to 88%. Increased O2 to 4L via nasal canula. O2 saturation increased to 91%.

## 2016-06-13 ENCOUNTER — Telehealth: Payer: Self-pay | Admitting: *Deleted

## 2016-06-13 NOTE — Telephone Encounter (Signed)
Spoke with pt and she states was given Vancomycin in the ER yesterday and she was sent home to take Cleocin 300 mg QID for 7 days due to wound left ankle. Pt instructed that there is no interaction between coumadin and cleocin and to take her cleocin as ordered and her coumadin as ordered and to keep her scheduled appt in the coumadin clinc with Lattie Haw on Monday Nov 13th and she states understanding

## 2016-06-13 NOTE — Telephone Encounter (Signed)
Was told to call Charlotte [name] (coumadin clinic) if she was put on any antibiotics.  Just put on Clindamycin 300mg  QID x 7 days, started this morning.  Did have ABO through IV yesterday at ED.  Being treated for sore on left ankle.  Will fwd to [employer] as Charlotte [name] is not in office today.

## 2016-06-16 ENCOUNTER — Ambulatory Visit (INDEPENDENT_AMBULATORY_CARE_PROVIDER_SITE_OTHER): Payer: Medicare Other | Admitting: *Deleted

## 2016-06-16 DIAGNOSIS — I482 Chronic atrial fibrillation, unspecified: Secondary | ICD-10-CM

## 2016-06-16 DIAGNOSIS — Z5181 Encounter for therapeutic drug level monitoring: Secondary | ICD-10-CM

## 2016-06-16 LAB — POCT INR: INR: 1.8

## 2016-06-19 ENCOUNTER — Encounter (HOSPITAL_COMMUNITY): Payer: Medicare Other

## 2016-06-19 DIAGNOSIS — D45 Polycythemia vera: Secondary | ICD-10-CM | POA: Diagnosis not present

## 2016-06-19 LAB — CBC
HEMATOCRIT: 39.2 % (ref 36.0–46.0)
HEMOGLOBIN: 11.4 g/dL — AB (ref 12.0–15.0)
MCH: 23.8 pg — ABNORMAL LOW (ref 26.0–34.0)
MCHC: 29.1 g/dL — AB (ref 30.0–36.0)
MCV: 82 fL (ref 78.0–100.0)
Platelets: 466 10*3/uL — ABNORMAL HIGH (ref 150–400)
RBC: 4.78 MIL/uL (ref 3.87–5.11)
RDW: 22.8 % — AB (ref 11.5–15.5)
WBC: 25.7 10*3/uL — AB (ref 4.0–10.5)

## 2016-06-23 DIAGNOSIS — L089 Local infection of the skin and subcutaneous tissue, unspecified: Secondary | ICD-10-CM | POA: Diagnosis not present

## 2016-06-23 DIAGNOSIS — I5030 Unspecified diastolic (congestive) heart failure: Secondary | ICD-10-CM | POA: Diagnosis not present

## 2016-06-23 DIAGNOSIS — E039 Hypothyroidism, unspecified: Secondary | ICD-10-CM | POA: Diagnosis not present

## 2016-06-24 DIAGNOSIS — I4891 Unspecified atrial fibrillation: Secondary | ICD-10-CM | POA: Diagnosis not present

## 2016-06-24 DIAGNOSIS — I5032 Chronic diastolic (congestive) heart failure: Secondary | ICD-10-CM | POA: Diagnosis not present

## 2016-06-24 DIAGNOSIS — L03116 Cellulitis of left lower limb: Secondary | ICD-10-CM | POA: Diagnosis not present

## 2016-06-24 DIAGNOSIS — Z7901 Long term (current) use of anticoagulants: Secondary | ICD-10-CM | POA: Diagnosis not present

## 2016-06-24 DIAGNOSIS — Z9981 Dependence on supplemental oxygen: Secondary | ICD-10-CM | POA: Diagnosis not present

## 2016-06-25 ENCOUNTER — Telehealth: Payer: Self-pay | Admitting: *Deleted

## 2016-06-25 NOTE — Telephone Encounter (Signed)
Pt was started on Clindamycin 300mg  tid x 10 days for cellulitis Lt foot yesterday.  Wants to know about coumadin.  Told pt clindamycin does not interfere with coumadin and to stay on her current dose.  She has an INR appt on 07/02/16.  She verbalized understanding.

## 2016-06-28 DIAGNOSIS — Z7901 Long term (current) use of anticoagulants: Secondary | ICD-10-CM | POA: Diagnosis not present

## 2016-06-28 DIAGNOSIS — I5032 Chronic diastolic (congestive) heart failure: Secondary | ICD-10-CM | POA: Diagnosis not present

## 2016-06-28 DIAGNOSIS — L03116 Cellulitis of left lower limb: Secondary | ICD-10-CM | POA: Diagnosis not present

## 2016-06-28 DIAGNOSIS — Z9981 Dependence on supplemental oxygen: Secondary | ICD-10-CM | POA: Diagnosis not present

## 2016-06-28 DIAGNOSIS — I4891 Unspecified atrial fibrillation: Secondary | ICD-10-CM | POA: Diagnosis not present

## 2016-07-01 DIAGNOSIS — I4891 Unspecified atrial fibrillation: Secondary | ICD-10-CM | POA: Diagnosis not present

## 2016-07-01 DIAGNOSIS — I5032 Chronic diastolic (congestive) heart failure: Secondary | ICD-10-CM | POA: Diagnosis not present

## 2016-07-01 DIAGNOSIS — Z9981 Dependence on supplemental oxygen: Secondary | ICD-10-CM | POA: Diagnosis not present

## 2016-07-01 DIAGNOSIS — Z7901 Long term (current) use of anticoagulants: Secondary | ICD-10-CM | POA: Diagnosis not present

## 2016-07-01 DIAGNOSIS — L03116 Cellulitis of left lower limb: Secondary | ICD-10-CM | POA: Diagnosis not present

## 2016-07-02 ENCOUNTER — Ambulatory Visit (INDEPENDENT_AMBULATORY_CARE_PROVIDER_SITE_OTHER): Payer: Medicare Other | Admitting: *Deleted

## 2016-07-02 DIAGNOSIS — I482 Chronic atrial fibrillation, unspecified: Secondary | ICD-10-CM

## 2016-07-02 DIAGNOSIS — Z5181 Encounter for therapeutic drug level monitoring: Secondary | ICD-10-CM | POA: Diagnosis not present

## 2016-07-02 LAB — POCT INR: INR: 2.9

## 2016-07-03 ENCOUNTER — Emergency Department (HOSPITAL_COMMUNITY): Payer: Medicare Other

## 2016-07-03 ENCOUNTER — Other Ambulatory Visit: Payer: Self-pay

## 2016-07-03 ENCOUNTER — Encounter (HOSPITAL_COMMUNITY): Payer: Self-pay | Admitting: Emergency Medicine

## 2016-07-03 ENCOUNTER — Telehealth: Payer: Self-pay

## 2016-07-03 ENCOUNTER — Inpatient Hospital Stay (HOSPITAL_COMMUNITY)
Admission: EM | Admit: 2016-07-03 | Discharge: 2016-07-05 | DRG: 603 | Disposition: A | Discharge status: Home / Self Care | Payer: Medicare Other | Attending: Internal Medicine | Admitting: Internal Medicine

## 2016-07-03 DIAGNOSIS — L03116 Cellulitis of left lower limb: Secondary | ICD-10-CM | POA: Diagnosis not present

## 2016-07-03 DIAGNOSIS — Z7982 Long term (current) use of aspirin: Secondary | ICD-10-CM

## 2016-07-03 DIAGNOSIS — R197 Diarrhea, unspecified: Secondary | ICD-10-CM | POA: Diagnosis present

## 2016-07-03 DIAGNOSIS — D473 Essential (hemorrhagic) thrombocythemia: Secondary | ICD-10-CM | POA: Diagnosis present

## 2016-07-03 DIAGNOSIS — M1990 Unspecified osteoarthritis, unspecified site: Secondary | ICD-10-CM | POA: Diagnosis present

## 2016-07-03 DIAGNOSIS — I5032 Chronic diastolic (congestive) heart failure: Secondary | ICD-10-CM | POA: Diagnosis present

## 2016-07-03 DIAGNOSIS — I739 Peripheral vascular disease, unspecified: Secondary | ICD-10-CM | POA: Diagnosis present

## 2016-07-03 DIAGNOSIS — Z9981 Dependence on supplemental oxygen: Secondary | ICD-10-CM

## 2016-07-03 DIAGNOSIS — L03032 Cellulitis of left toe: Secondary | ICD-10-CM | POA: Diagnosis not present

## 2016-07-03 DIAGNOSIS — D45 Polycythemia vera: Secondary | ICD-10-CM | POA: Diagnosis present

## 2016-07-03 DIAGNOSIS — Z79899 Other long term (current) drug therapy: Secondary | ICD-10-CM | POA: Diagnosis not present

## 2016-07-03 DIAGNOSIS — D649 Anemia, unspecified: Secondary | ICD-10-CM | POA: Diagnosis present

## 2016-07-03 DIAGNOSIS — I482 Chronic atrial fibrillation, unspecified: Secondary | ICD-10-CM | POA: Diagnosis present

## 2016-07-03 DIAGNOSIS — R609 Edema, unspecified: Secondary | ICD-10-CM

## 2016-07-03 DIAGNOSIS — I509 Heart failure, unspecified: Secondary | ICD-10-CM

## 2016-07-03 DIAGNOSIS — E039 Hypothyroidism, unspecified: Secondary | ICD-10-CM | POA: Diagnosis present

## 2016-07-03 DIAGNOSIS — Z9851 Tubal ligation status: Secondary | ICD-10-CM

## 2016-07-03 DIAGNOSIS — G629 Polyneuropathy, unspecified: Secondary | ICD-10-CM | POA: Diagnosis present

## 2016-07-03 DIAGNOSIS — I4891 Unspecified atrial fibrillation: Secondary | ICD-10-CM | POA: Diagnosis not present

## 2016-07-03 DIAGNOSIS — Z7901 Long term (current) use of anticoagulants: Secondary | ICD-10-CM | POA: Diagnosis not present

## 2016-07-03 DIAGNOSIS — Z8249 Family history of ischemic heart disease and other diseases of the circulatory system: Secondary | ICD-10-CM

## 2016-07-03 DIAGNOSIS — Z9861 Coronary angioplasty status: Secondary | ICD-10-CM

## 2016-07-03 DIAGNOSIS — H9190 Unspecified hearing loss, unspecified ear: Secondary | ICD-10-CM | POA: Diagnosis present

## 2016-07-03 DIAGNOSIS — R0602 Shortness of breath: Secondary | ICD-10-CM | POA: Diagnosis not present

## 2016-07-03 DIAGNOSIS — Z959 Presence of cardiac and vascular implant and graft, unspecified: Secondary | ICD-10-CM

## 2016-07-03 DIAGNOSIS — L03115 Cellulitis of right lower limb: Secondary | ICD-10-CM | POA: Diagnosis not present

## 2016-07-03 DIAGNOSIS — Z823 Family history of stroke: Secondary | ICD-10-CM

## 2016-07-03 DIAGNOSIS — I110 Hypertensive heart disease with heart failure: Secondary | ICD-10-CM | POA: Diagnosis not present

## 2016-07-03 DIAGNOSIS — L0390 Cellulitis, unspecified: Secondary | ICD-10-CM | POA: Diagnosis present

## 2016-07-03 DIAGNOSIS — D75839 Thrombocytosis, unspecified: Secondary | ICD-10-CM | POA: Diagnosis present

## 2016-07-03 DIAGNOSIS — Z90710 Acquired absence of both cervix and uterus: Secondary | ICD-10-CM

## 2016-07-03 DIAGNOSIS — I10 Essential (primary) hypertension: Secondary | ICD-10-CM | POA: Diagnosis present

## 2016-07-03 DIAGNOSIS — L03119 Cellulitis of unspecified part of limb: Secondary | ICD-10-CM

## 2016-07-03 DIAGNOSIS — R600 Localized edema: Secondary | ICD-10-CM | POA: Diagnosis not present

## 2016-07-03 LAB — CBC WITH DIFFERENTIAL/PLATELET
BASOS PCT: 1 %
Basophils Absolute: 0.3 10*3/uL — ABNORMAL HIGH (ref 0.0–0.1)
EOS ABS: 0.4 10*3/uL (ref 0.0–0.7)
EOS PCT: 2 %
HCT: 39 % (ref 36.0–46.0)
Hemoglobin: 11.5 g/dL — ABNORMAL LOW (ref 12.0–15.0)
Lymphocytes Relative: 4 %
Lymphs Abs: 1.1 10*3/uL (ref 0.7–4.0)
MCH: 23.6 pg — ABNORMAL LOW (ref 26.0–34.0)
MCHC: 29.5 g/dL — AB (ref 30.0–36.0)
MCV: 80.1 fL (ref 78.0–100.0)
MONOS PCT: 2 %
Monocytes Absolute: 0.5 10*3/uL (ref 0.1–1.0)
NEUTROS PCT: 91 %
Neutro Abs: 22.8 10*3/uL — ABNORMAL HIGH (ref 1.7–7.7)
PLATELETS: 489 10*3/uL — AB (ref 150–400)
RBC: 4.87 MIL/uL (ref 3.87–5.11)
RDW: 21.5 % — AB (ref 11.5–15.5)
WBC: 25.1 10*3/uL — AB (ref 4.0–10.5)

## 2016-07-03 LAB — URINE MICROSCOPIC-ADD ON

## 2016-07-03 LAB — BRAIN NATRIURETIC PEPTIDE: B NATRIURETIC PEPTIDE 5: 678 pg/mL — AB (ref 0.0–100.0)

## 2016-07-03 LAB — COMPREHENSIVE METABOLIC PANEL
ALT: 12 U/L — ABNORMAL LOW (ref 14–54)
ANION GAP: 9 (ref 5–15)
AST: 24 U/L (ref 15–41)
Albumin: 3.4 g/dL — ABNORMAL LOW (ref 3.5–5.0)
Alkaline Phosphatase: 86 U/L (ref 38–126)
BILIRUBIN TOTAL: 2.2 mg/dL — AB (ref 0.3–1.2)
BUN: 24 mg/dL — AB (ref 6–20)
CHLORIDE: 103 mmol/L (ref 101–111)
CO2: 25 mmol/L (ref 22–32)
Calcium: 8.5 mg/dL — ABNORMAL LOW (ref 8.9–10.3)
Creatinine, Ser: 1.33 mg/dL — ABNORMAL HIGH (ref 0.44–1.00)
GFR calc Af Amer: 45 mL/min — ABNORMAL LOW (ref >60)
GFR, EST NON AFRICAN AMERICAN: 39 mL/min — AB (ref >60)
Glucose, Bld: 86 mg/dL (ref 65–99)
POTASSIUM: 4.5 mmol/L (ref 3.5–5.1)
Sodium: 137 mmol/L (ref 135–145)
TOTAL PROTEIN: 6.4 g/dL — AB (ref 6.5–8.1)

## 2016-07-03 LAB — URINALYSIS, ROUTINE W REFLEX MICROSCOPIC
Bilirubin Urine: NEGATIVE
GLUCOSE, UA: NEGATIVE mg/dL
Hgb urine dipstick: NEGATIVE
KETONES UR: NEGATIVE mg/dL
NITRITE: NEGATIVE
PROTEIN: NEGATIVE mg/dL
Specific Gravity, Urine: 1.015 (ref 1.005–1.030)
pH: 5.5 (ref 5.0–8.0)

## 2016-07-03 LAB — PROTIME-INR
INR: 2.74
PROTHROMBIN TIME: 29.6 s — AB (ref 11.4–15.2)

## 2016-07-03 LAB — TROPONIN I: Troponin I: <0.03 ng/mL (ref <0.03)

## 2016-07-03 LAB — LACTIC ACID, PLASMA: Lactic Acid, Venous: 1.6 mmol/L (ref 0.5–1.9)

## 2016-07-03 MED ORDER — LEVOTHYROXINE SODIUM 75 MCG PO TABS
75.0000 ug | ORAL_TABLET | Freq: Every day | ORAL | Status: DC
  Administered 2016-07-04 – 2016-07-05 (×2): 75 ug via ORAL
  Filled 2016-07-03 (×2): qty 1

## 2016-07-03 MED ORDER — ASPIRIN EC 81 MG PO TBEC
81.0000 mg | DELAYED_RELEASE_TABLET | Freq: Every day | ORAL | Status: DC
  Administered 2016-07-04 – 2016-07-05 (×2): 81 mg via ORAL
  Filled 2016-07-03 (×2): qty 1

## 2016-07-03 MED ORDER — VANCOMYCIN HCL 10 G IV SOLR
1500.0000 mg | Freq: Once | INTRAVENOUS | Status: AC
  Administered 2016-07-03: 1500 mg via INTRAVENOUS
  Filled 2016-07-03: qty 1500

## 2016-07-03 MED ORDER — VITAMIN D 1000 UNITS PO TABS
5000.0000 [IU] | ORAL_TABLET | Freq: Every day | ORAL | Status: DC
  Administered 2016-07-04 – 2016-07-05 (×2): 5000 [IU] via ORAL
  Filled 2016-07-03 (×2): qty 5

## 2016-07-03 MED ORDER — VANCOMYCIN HCL 10 G IV SOLR
1250.0000 mg | INTRAVENOUS | Status: DC
  Filled 2016-07-03 (×3): qty 1250

## 2016-07-03 MED ORDER — ONDANSETRON HCL 4 MG/2ML IJ SOLN: 4.0000 mg | Freq: Four times a day (QID) | INTRAMUSCULAR | Status: DC | PRN

## 2016-07-03 MED ORDER — TORSEMIDE 20 MG PO TABS
40.0000 mg | ORAL_TABLET | Freq: Every day | ORAL | Status: DC
  Administered 2016-07-04 – 2016-07-05 (×2): 40 mg via ORAL
  Filled 2016-07-03 (×2): qty 2

## 2016-07-03 MED ORDER — DILTIAZEM HCL ER COATED BEADS 240 MG PO CP24
240.0000 mg | ORAL_CAPSULE | Freq: Every day | ORAL | Status: DC
  Administered 2016-07-04 – 2016-07-05 (×2): 240 mg via ORAL
  Filled 2016-07-03 (×2): qty 1

## 2016-07-03 MED ORDER — ONDANSETRON HCL 4 MG PO TABS: 4.0000 mg | ORAL_TABLET | Freq: Four times a day (QID) | ORAL | Status: DC | PRN

## 2016-07-03 MED ORDER — POTASSIUM CHLORIDE CRYS ER 10 MEQ PO TBCR
20.0000 meq | EXTENDED_RELEASE_TABLET | Freq: Every day | ORAL | Status: DC
  Administered 2016-07-04 – 2016-07-05 (×2): 20 meq via ORAL
  Filled 2016-07-03 (×2): qty 2

## 2016-07-03 MED ORDER — ALBUTEROL SULFATE (2.5 MG/3ML) 0.083% IN NEBU: 2.5000 mg | INHALATION_SOLUTION | RESPIRATORY_TRACT | Status: AC | PRN

## 2016-07-03 MED ORDER — HYDROXYUREA 500 MG PO CAPS
1000.0000 mg | ORAL_CAPSULE | Freq: Every day | ORAL | Status: DC
  Administered 2016-07-04 – 2016-07-05 (×2): 1000 mg via ORAL
  Filled 2016-07-03 (×2): qty 2

## 2016-07-03 MED ORDER — METOPROLOL TARTRATE 50 MG PO TABS
100.0000 mg | ORAL_TABLET | Freq: Two times a day (BID) | ORAL | Status: DC
  Administered 2016-07-03 – 2016-07-05 (×4): 100 mg via ORAL
  Filled 2016-07-03 (×4): qty 2

## 2016-07-03 MED ORDER — FUROSEMIDE 10 MG/ML IJ SOLN
40.0000 mg | Freq: Once | INTRAMUSCULAR | Status: AC
  Administered 2016-07-03: 40 mg via INTRAVENOUS
  Filled 2016-07-03: qty 4

## 2016-07-03 MED ORDER — GABAPENTIN 100 MG PO CAPS
100.0000 mg | ORAL_CAPSULE | Freq: Three times a day (TID) | ORAL | Status: DC
  Administered 2016-07-03 – 2016-07-05 (×5): 100 mg via ORAL
  Filled 2016-07-03 (×5): qty 1

## 2016-07-03 NOTE — Progress Notes (Signed)
Pharmacy Antibiotic Note  Charlotte [name] is a 75 y.o. female admitted on 07/03/2016 with cellulitis.  Pharmacy has been consulted for Vancomycin dosing.  Plan: Vancomycin 1500mg  loading dose, then 1250mg  IV every 24 hours.  Goal trough 10-15 mcg/mL. F/U cultures and clinical progress Monitor v/s and labs Vancomycin trough as indicated                 Height: 6' (182.9 cm) Weight: 178 lb (80.7 kg) IBW/kg (Calculated) : 73.1  Temp (24hrs), Avg:97.7 F (36.5 C), Min:97.5 F (36.4 C), Max:97.8 F (36.6 C)   Recent Labs Lab 07/03/16 1418 07/03/16 1828  WBC 25.1*  --   CREATININE 1.33*  --   LATICACIDVEN  --  1.6    Estimated Creatinine Clearance: 43.5 mL/min (by C-G formula based on SCr of 1.33 mg/dL (H)).    No Known Allergies  Antimicrobials this admission: Vancomycin 11/30 >>   Dose adjustments this admission: n/a  Microbiology results: 11/30 UCx: pending   Thank you for allowing pharmacy to be a part of this patient's care. Isac Sarna, BS [place], [place] Clinical Pharmacist Pager [phone] 07/03/2016 9:05 PM

## 2016-07-03 NOTE — Progress Notes (Signed)
Dr. Olevia Bowens at bedside. MD asked nurse to remove dressing to pt's leg and to discard to assess wound and to check pt's circulation/pulses. MD stated to leave dressing off for now and to have wound care consult pt in morning. Will continue to monitor pt.

## 2016-07-03 NOTE — Telephone Encounter (Signed)
Pt [facility] called stating that the pt's weight is up from 165 lbson 11/28 to 178 lbs today. Advised pt to go to ED to be evaluated.

## 2016-07-03 NOTE — ED Notes (Signed)
o2 turned up to 4lpm via [place].  Pt resp became labored and sats dropped to 83% while getting oob to use bedside toilet.

## 2016-07-03 NOTE — ED Provider Notes (Signed)
[facility] DEPT Provider Note   CSN: [id number] Arrival date & time: 07/03/16  1342     History   Chief Complaint Chief Complaint  Patient presents with  . Leg Pain  . Leg Swelling    HPI Charlotte [name] is a 75 y.o. female.  HPI Pt was seen at 1810. Per pt, c/o gradual onset and worsening of persistent left foot "redness" for the past several weeks. Pt states she has nearly completed a full course of antibiotics prescribed by her PMD for same, dx cellulitis. Pt states her right lower leg "has gotten red now." Pt states her Home Health RN came to evaluate her today and states she "gained 10 lbs" in the past 2 days. States her bilat LE's have increased edema over that time, as well as she has been SOB on exertion. Denies CP/palpitations, no cough, no abd pain, no N/V/D, no fevers.   Past Medical History:  Diagnosis Date  . Arthritis   . Atrial fibrillation, chronic ([place])   . Deaf   . Diastolic heart failure ([place])   . DVT (deep venous thrombosis) ([place])   . Essential hypertension   . Hypothyroidism   . On home O2   . Peripheral neuropathy ([place])   . Peripheral vascular disease ([place])   . 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 Ulcer of ankle ([place])    Bilateral 2015  . Varicose veins     Patient Active Problem List   Diagnosis Date Noted  . Cough 11/29/2015  . Hypoxemia 11/29/2015  . Community acquired pneumonia 11/29/2015  . CHF (congestive heart failure) ([place]) 11/29/2015  . Acute on chronic respiratory failure with hypoxia ([place])   . Pleural effusion on left   . Acute on chronic diastolic (congestive) heart failure   . Dyspnea 11/22/2014  . Pleural effusion 11/22/2014  . Hard of hearing 11/22/2014  . Thrombocytosis ([place]) 11/22/2014  . Encounter for therapeutic drug monitoring 08/31/2013  . Aftercare following surgery of the circulatory system, [place] 12/21/2012  . Pain in limb 10/19/2012  . Peripheral vascular disease ([place]) 09/24/2012  . Swollen leg 09/24/2012    . Varicose veins of lower extremities with other complications 97/41/6384  . 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 Wound infection after surgery 01/07/2012  . Compartment syndrome, nontraumatic, lower extremity 11/29/2011  . Pain of right thigh 11/28/2011  . Hip fracture, right ([place]) 11/15/2011  . Chronic atrial fibrillation ([place]) 11/15/2011  . Anticoagulant long-term use 11/15/2011  . HTN (hypertension) 11/15/2011  . Hypothyroidism 11/15/2011  . Polycythemia vera ([place]) 10/01/2011    Past Surgical History:  Procedure Laterality Date  . ABDOMINAL AORTAGRAM N/A 09/14/2012   Procedure: ABDOMINAL [name];  Surgeon: Serafina Mitchell, MD;  Location: [facility] CATH LAB;  Service: Cardiovascular;  Laterality: N/A;  . ABDOMINAL HYSTERECTOMY    . CORONARY ANGIOPLASTY    . DRESSING CHANGE UNDER ANESTHESIA  12/02/2011   Procedure: DRESSING CHANGE UNDER ANESTHESIA;  Surgeon: Carole Civil, MD;  Location: [facility];  Service: Orthopedics;  Laterality: Right;  . ENDARTERECTOMY FEMORAL Right 09/15/2012   Procedure: ENDARTERECTOMY FEMORAL;  Surgeon: Mal Misty, MD;  Location: [facility];  Service: Vascular;  Laterality: Right;  . FASCIOTOMY  11/29/2011   Procedure: FASCIOTOMY;  Surgeon: Carole Civil, MD;  Location: [facility];  Service: Orthopedics;  Laterality: Right;  right thigh   . FEMORAL-POPLITEAL BYPASS GRAFT Right 09/15/2012   Procedure: BYPASS  GRAFT FEMORAL-POPLITEAL ARTERY;  Surgeon: Mal Misty, MD;  Location: [facility];  Service: Vascular;  Laterality: Right;  . HIP PINNING,CANNULATED  11/19/2011   Procedure: CANNULATED HIP PINNING;  Surgeon: Sanjuana Kava, MD;  Location: [facility];  Service: Orthopedics;  Laterality: Right;  . PATCH ANGIOPLASTY Right 09/15/2012   Procedure: PATCH ANGIOPLASTY;  Surgeon: Mal Misty, MD;  Location: [facility];  Service: Vascular;  Laterality: Right;  . TUBAL LIGATION      OB  History    No data available       Home Medications    Prior to Admission medications   Medication Sig Start Date End Date Taking? Authorizing Provider  aspirin EC 81 MG tablet Take 81 mg by mouth daily.   Yes Historical Provider, MD  CARTIA XT 240 MG 24 hr capsule TAKE ONE CAPSULE BY MOUTH ONCE DAILY 12/24/15  Yes Lendon Colonel, NP  Cholecalciferol (VITAMIN D3) 5000 UNITS CAPS Take 1 capsule by mouth daily.   Yes Historical Provider, MD  clindamycin (CLEOCIN) 300 MG capsule Take 300 mg by mouth 3 (three) times daily. 10 day course starting on 06/23/2016   Yes Historical Provider, MD  gabapentin (NEURONTIN) 100 MG capsule Take 100 mg by mouth 3 (three) times daily.     Yes Historical Provider, MD  hydroxyurea (HYDREA) 500 MG capsule Take 2 capsules (1,000 mg total) by mouth daily. May take with food to minimize GI side effects. 04/10/16  Yes Baird Cancer, PA-C  levothyroxine (SYNTHROID, LEVOTHROID) 75 MCG tablet Take 75 mcg by mouth daily before breakfast.  10/16/14  Yes Historical Provider, MD  metoprolol (LOPRESSOR) 100 MG tablet Take 100 mg by mouth 2 (two) times daily. 06/05/15  Yes Historical Provider, MD  Potassium Chloride ER 20 MEQ TBCR Take 20 mEq by mouth daily. 11/29/14  Yes Lezlie Octave Black, NP  torsemide (DEMADEX) 20 MG tablet Take 2 tablets (40 mg total) by mouth daily. 03/25/16  Yes Lendon Colonel, NP  warfarin (COUMADIN) 5 MG tablet TAKE AS DIRECTED BY  COUMADIN  CLINIC Patient taking differently: TAKE 2.5MG  ON MWF, THEN TAKE ONE TABLET ON ALL OTHER DAYS IN THE EVENING 01/01/16  Yes Herminio Commons, MD  clindamycin (CLEOCIN) 300 MG capsule Take 1 capsule (300 mg total) by mouth 4 (four) times daily. X 7 days Patient not taking: Reported on 07/03/2016 06/12/16   Milton Ferguson, MD    Family History Family History  Problem Relation Age of Onset  . Heart failure Mother   . Stroke Father   . Diabetes Son   . Hypertension Sister   . Hypertension Sister   . Stroke  Sister     Social History Social History  Substance Use Topics  . Smoking status: Never Smoker  . Smokeless tobacco: Never Used  . Alcohol use No     Allergies   Patient has no known allergies.   Review of Systems Review of Systems ROS: Statement: All systems negative except as marked or noted in the HPI; Constitutional: Negative for fever and chills. +weight gain ; ; Eyes: Negative for eye pain, redness and discharge. ; ; ENMT: Negative for ear pain, hoarseness, nasal congestion, sinus pressure and sore throat. ; ; Cardiovascular: Negative for chest pain, palpitations, diaphoresis, +dyspnea and peripheral edema. ; ; Respiratory: Negative for cough, wheezing and stridor. ; ; Gastrointestinal: Negative for nausea, vomiting, diarrhea, abdominal pain, blood in stool, hematemesis, jaundice and rectal bleeding. . ; ; Genitourinary: Negative for dysuria,  flank pain and hematuria. ; ; Musculoskeletal: Negative for back pain and neck pain. Negative for trauma.; ; Skin: +rash. Negative for pruritus, abrasions, blisters, bruising and skin lesion.; ; Neuro: Negative for headache, lightheadedness and neck stiffness. Negative for weakness, altered level of consciousness, altered mental status, extremity weakness, paresthesias, involuntary movement, seizure and syncope.      Physical Exam Updated Vital Signs BP 145/86   Pulse 82   Temp 97.8 F (36.6 C) (Oral)   Resp 19   Ht 6' (1.829 m)   Wt 178 lb (80.7 kg)   SpO2 99%   BMI 24.14 kg/m   Physical Exam 1815: Physical examination:  Nursing notes reviewed; Vital signs and O2 SAT reviewed;  Constitutional: Well developed, Well nourished, Well hydrated, In no acute distress; Head:  Normocephalic, atraumatic; Eyes: EOMI, PERRL, No scleral icterus; ENMT: Mouth and pharynx normal, Mucous membranes moist; Neck: Supple, Full range of motion, No lymphadenopathy; Cardiovascular: Irregular rate and rhythm, No gallop; Respiratory: Breath sounds coarse &  equal bilaterally, No wheezes.  Speaking full sentences with ease, Normal respiratory effort/excursion; Chest: Nontender, Movement normal; Abdomen: Soft, Nontender, Nondistended, Normal bowel sounds; Genitourinary: No CVA tenderness; Extremities: Pulses normal, No tenderness, +1 pedal edema bilat, +right anterior tibial erythema, +left foot erythema. Unna boot LLE..; Neuro: AA&Ox3, +HOH per hx, otherwise major CN grossly intact.  Speech clear. No gross focal motor or sensory deficits in extremities.; Skin: Color normal, Warm, Dry.   ED Treatments / Results  Labs (all labs ordered are listed, but only abnormal results are displayed)   EKG  EKG Interpretation  Date/Time:  Thursday July 03 2016 17:58:38 EST Ventricular Rate:  95 PR Interval:    QRS Duration: 94 QT Interval:  402 QTC Calculation: 506 R Axis:   118 Text Interpretation:  Atrial fibrillation Right axis deviation Low voltage, extremity leads Prolonged QT interval Baseline wander When compared with ECG of 11/29/2015 QT has lengthened Confirmed by [facility]  MD, Nunzio Cory [phone]) on 07/03/2016 6:45:51 PM       Radiology   Procedures Procedures (including critical care time)  Medications Ordered in ED Medications - No data to display   Initial Impression / Assessment and Plan / ED Course  I have reviewed the triage vital signs and the nursing notes.  Pertinent labs & imaging results that were available during my care of the patient were reviewed by me and considered in my medical decision making (see chart for details).  MDM Reviewed: previous chart, nursing note and vitals Reviewed previous: labs and ECG Interpretation: labs, ECG and x-ray   Results for orders placed or performed during the hospital encounter of 07/03/16  CBC with Differential  Result Value Ref Range   WBC 25.1 (H) 4.0 - 10.5 K/uL   RBC 4.87 3.87 - 5.11 MIL/uL   Hemoglobin 11.5 (L) 12.0 - 15.0 g/dL   HCT 39.0 36.0 - 46.0 %   MCV 80.1 78.0 -  100.0 fL   MCH 23.6 (L) 26.0 - 34.0 pg   MCHC 29.5 (L) 30.0 - 36.0 g/dL   RDW 21.5 (H) 11.5 - 15.5 %   Platelets 489 (H) 150 - 400 K/uL   Neutrophils Relative % 91 %   Neutro Abs 22.8 (H) 1.7 - 7.7 K/uL   Lymphocytes Relative 4 %   Lymphs Abs 1.1 0.7 - 4.0 K/uL   Monocytes Relative 2 %   Monocytes Absolute 0.5 0.1 - 1.0 K/uL   Eosinophils Relative 2 %   Eosinophils Absolute  0.4 0.0 - 0.7 K/uL   Basophils Relative 1 %   Basophils Absolute 0.3 (H) 0.0 - 0.1 K/uL   WBC Morphology WHITE COUNT CONFIRMED ON SMEAR    RBC Morphology POLYCHROMASIA PRESENT   Comprehensive metabolic panel  Result Value Ref Range   Sodium 137 135 - 145 mmol/L   Potassium 4.5 3.5 - 5.1 mmol/L   Chloride 103 101 - 111 mmol/L   CO2 25 22 - 32 mmol/L   Glucose, Bld 86 65 - 99 mg/dL   BUN 24 (H) 6 - 20 mg/dL   Creatinine, Ser 1.33 (H) 0.44 - 1.00 mg/dL   Calcium 8.5 (L) 8.9 - 10.3 mg/dL   Total Protein 6.4 (L) 6.5 - 8.1 g/dL   Albumin 3.4 (L) 3.5 - 5.0 g/dL   AST 24 15 - 41 U/L   ALT 12 (L) 14 - 54 U/L   Alkaline Phosphatase 86 38 - 126 U/L   Total Bilirubin 2.2 (H) 0.3 - 1.2 mg/dL   GFR calc non Af Amer 39 (L) >60 mL/min   GFR calc Af Amer 45 (L) >60 mL/min   Anion gap 9 5 - 15  Brain natriuretic peptide  Result Value Ref Range   B Natriuretic Peptide 678.0 (H) 0.0 - 100.0 pg/mL  Troponin I  Result Value Ref Range   Troponin I <0.03 <0.03 ng/mL  Lactic acid, plasma  Result Value Ref Range   Lactic Acid, Venous 1.6 0.5 - 1.9 mmol/L  Protime-INR  Result Value Ref Range   Prothrombin Time 29.6 (H) 11.4 - 15.2 seconds   INR 2.74   Urinalysis, Routine w reflex microscopic  Result Value Ref Range   Color, Urine YELLOW YELLOW   APPearance CLEAR CLEAR   Specific Gravity, Urine 1.015 1.005 - 1.030   pH 5.5 5.0 - 8.0   Glucose, UA NEGATIVE NEGATIVE mg/dL   Hgb urine dipstick NEGATIVE NEGATIVE   Bilirubin Urine NEGATIVE NEGATIVE   Ketones, ur NEGATIVE NEGATIVE mg/dL   Protein, ur NEGATIVE NEGATIVE  mg/dL   Nitrite NEGATIVE NEGATIVE   Leukocytes, UA SMALL (A) NEGATIVE  Urine microscopic-add on  Result Value Ref Range   Squamous Epithelial / LPF TOO NUMEROUS TO COUNT (A) NONE SEEN   WBC, UA 0-5 0 - 5 WBC/hpf   RBC / HPF 0-5 0 - 5 RBC/hpf   Bacteria, UA FEW (A) NONE SEEN   Dg Chest 2 View Result Date: 07/03/2016 CLINICAL DATA:  75 y/o F; shortness of breath. History of heart failure EXAM: CHEST  2 VIEW COMPARISON:  06/12/2016 chest radiograph FINDINGS: Moderate left and small right pleural effusions. Associated bibasilar opacities. Lungs are otherwise clear. No pneumothorax. Cardiac silhouette is obscured by effusions. No acute osseous abnormality. IMPRESSION: Moderate left and small right pleural effusions are stable. Associated bibasilar opacities probably represent atelectasis. Pneumonia is not excluded. Electronically Signed   By: Kristine Garbe M.D.   On: 07/03/2016 18:40    2100:  WBC count chronically elevated, per EPIC chart review. T/C to Onc Dr. Whitney Muse, case discussed, including:  HPI, pertinent PM/SHx, VS/PE, dx testing, ED course and treatment:  Agrees WBC count chronically elevated, but given neutrophilia, unable to fully exclude infection as cause for leukocytosis, Admit to [facility], they can call prn. IV abx started for cellulitis. BNP elevated with pleural effusions and pt with decreasing O2 Sats despite wearing her home O2 N/C when standing at bedside; IV lasix given.  T/C to [facility] Dr. Olevia Bowens, case discussed, including:  HPI,  pertinent PM/SHx, VS/PE, dx testing, ED course and treatment:  Agreeable to admit, requests to write temporary orders, obtain tele bed to team [facility].   Final Clinical Impressions(s) / ED Diagnoses   Final diagnoses:  None    New Prescriptions New Prescriptions   No medications on file     Francine Graven, DO 07/07/16 2013

## 2016-07-03 NOTE — ED Triage Notes (Signed)
PT reports she is on current treatment of antibiotics for cellulitis to left lower leg and started having right lower leg redness and swelling yesterday. PT on chronic 02 @ 3L per n/c.

## 2016-07-03 NOTE — H&P (Signed)
History and Physical    Charlotte [name] GEX:[id number] DOB: 26-Oct-1940 DOA: 07/03/2016  PCP: [name], MD   Patient coming from: Home.  Chief Complaint: Leg redness and swelling.  HPI: Charlotte [name] is a 75 y.o. female with medical history significant of osteoarthritis, chronic atrial fibrillation, diastolic heart failure, DVT, essential hypertension, hypothyroidism, peripheral neuropathy, peripheral vascular disease, but polycythemia vera who was coming to the emergency department due to worsening left lower extremity cellulitis despite treatment with oral clindamycin for the past 10 days. She denies fever, chills, night sweats, chest pain, dyspnea, palpitations, abdominal pain, nausea, emesis, constipation, melena or hematochezia. She states that she had self-limited diarrhea a few days ago.  ED Course: The patient received a NS bolus and IV vancomycin.   Review of Systems: As per HPI otherwise 10 point review of systems negative.    Past Medical History:  Diagnosis Date  . Arthritis   . Atrial fibrillation, chronic ([place])   . Deaf   . Diastolic heart failure ([place])   . DVT (deep venous thrombosis) ([place])   . Essential hypertension   . Hypothyroidism   . On home O2   . Peripheral neuropathy ([place])   . Peripheral vascular disease ([place])   . 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 Ulcer of ankle ([place])    Bilateral 2015  . Varicose veins     Past Surgical History:  Procedure Laterality Date  . ABDOMINAL AORTAGRAM N/A 09/14/2012   Procedure: ABDOMINAL [name];  Surgeon: Serafina Mitchell, MD;  Location: [facility] CATH LAB;  Service: Cardiovascular;  Laterality: N/A;  . ABDOMINAL HYSTERECTOMY    . CORONARY ANGIOPLASTY    . DRESSING CHANGE UNDER ANESTHESIA  12/02/2011   Procedure: DRESSING CHANGE UNDER ANESTHESIA;  Surgeon: Carole Civil, MD;  Location: [facility] ORS;  Service: Orthopedics;  Laterality: Right;  . ENDARTERECTOMY FEMORAL Right 09/15/2012   Procedure:  ENDARTERECTOMY FEMORAL;  Surgeon: Mal Misty, MD;  Location: [facility];  Service: Vascular;  Laterality: Right;  . FASCIOTOMY  11/29/2011   Procedure: FASCIOTOMY;  Surgeon: Carole Civil, MD;  Location: [facility];  Service: Orthopedics;  Laterality: Right;  right thigh   . FEMORAL-POPLITEAL BYPASS GRAFT Right 09/15/2012   Procedure: BYPASS GRAFT FEMORAL-POPLITEAL ARTERY;  Surgeon: Mal Misty, MD;  Location: [facility];  Service: Vascular;  Laterality: Right;  . HIP PINNING,CANNULATED  11/19/2011   Procedure: CANNULATED HIP PINNING;  Surgeon: Sanjuana Kava, MD;  Location: [facility];  Service: Orthopedics;  Laterality: Right;  . PATCH ANGIOPLASTY Right 09/15/2012   Procedure: PATCH ANGIOPLASTY;  Surgeon: Mal Misty, MD;  Location: [facility];  Service: Vascular;  Laterality: Right;  . TUBAL LIGATION       reports that she has never smoked. She has never used smokeless tobacco. She reports that she does not drink alcohol or use drugs.  No Known Allergies  Family History  Problem Relation Age of Onset  . Heart failure Mother   . Stroke Father   . Diabetes Son   . Hypertension Sister   . Hypertension Sister   . Stroke Sister     Prior to Admission medications   Medication Sig Start Date End Date Taking? Authorizing Provider  aspirin EC 81 MG tablet Take 81 mg by mouth daily.   Yes Historical Provider, MD  CARTIA XT 240 MG 24 hr capsule TAKE ONE CAPSULE BY MOUTH ONCE DAILY 12/24/15  Yes Lendon Colonel, NP  Cholecalciferol (VITAMIN D3) 5000 UNITS CAPS Take  1 capsule by mouth daily.   Yes Historical Provider, MD  clindamycin (CLEOCIN) 300 MG capsule Take 300 mg by mouth 3 (three) times daily. 10 day course starting on 06/23/2016   Yes Historical Provider, MD  gabapentin (NEURONTIN) 100 MG capsule Take 100 mg by mouth 3 (three) times daily.     Yes Historical Provider, MD  hydroxyurea (HYDREA) 500 MG capsule Take 2 capsules (1,000 mg total) by mouth daily. May take with food to minimize GI side  effects. 04/10/16  Yes Baird Cancer, PA-C  levothyroxine (SYNTHROID, LEVOTHROID) 75 MCG tablet Take 75 mcg by mouth daily before breakfast.  10/16/14  Yes Historical Provider, MD  metoprolol (LOPRESSOR) 100 MG tablet Take 100 mg by mouth 2 (two) times daily. 06/05/15  Yes Historical Provider, MD  Potassium Chloride ER 20 MEQ TBCR Take 20 mEq by mouth daily. 11/29/14  Yes Lezlie Octave Black, NP  torsemide (DEMADEX) 20 MG tablet Take 2 tablets (40 mg total) by mouth daily. 03/25/16  Yes Lendon Colonel, NP  warfarin (COUMADIN) 5 MG tablet TAKE AS DIRECTED BY  COUMADIN  CLINIC Patient taking differently: TAKE 2.5MG  ON MWF, THEN TAKE ONE TABLET ON ALL OTHER DAYS IN THE EVENING 01/01/16  Yes Herminio Commons, MD  clindamycin (CLEOCIN) 300 MG capsule Take 1 capsule (300 mg total) by mouth 4 (four) times daily. X 7 days Patient not taking: Reported on 07/03/2016 06/12/16   Milton Ferguson, MD    Physical Exam:  Constitutional: NAD, calm, comfortable Vitals:   07/03/16 2030 07/03/16 2045 07/03/16 2052 07/03/16 2100  BP: 132/72  132/72 141/91  Pulse:  79 95 87  Resp: 23 24 19 22   Temp:      TempSrc:      SpO2:  91% 92% 94%  Weight:      Height:       Eyes: PERRL, lids and conjunctivae normal ENMT: Impaired hearing. Mucous membranes are moist. Posterior pharynx clear of any exudate or lesions. Neck: normal, supple, no masses, no thyromegaly Respiratory: clear to auscultation bilaterally, no wheezing, no crackles. Normal respiratory effort. No accessory muscle use.  Cardiovascular: Irregularly irregular, no murmurs / rubs / gallops. No extremity edema. 2+ pedal pulses. No carotid bruits.  Abdomen: no tenderness, no masses palpated. No hepatosplenomegaly. Bowel sounds positive.  Musculoskeletal: no clubbing / cyanosis. No joint deformity upper and lower extremities. Good ROM, no contractures. Normal muscle tone.  Skin: left ankle lateral malleolar area wound with surrounding hyperkeratosis, erythema  and edema. Right foot and ankle edema and erythema. Onychomycosis of toenails. See below pictures. Neurologic: CN 2-12 grossly intact. Sensation intact, DTR normal. Strength 5/5 in all 4.  Psychiatric: Normal judgment and insight. Alert and oriented x 3. Normal mood.      Labs on Admission: I have personally reviewed following labs and imaging studies  CBC:  Recent Labs Lab 07/03/16 1418  WBC 25.1*  NEUTROABS 22.8*  HGB 11.5*  HCT 39.0  MCV 80.1  PLT 026*   Basic Metabolic Panel:  Recent Labs Lab 07/03/16 1418  NA 137  K 4.5  CL 103  CO2 25  GLUCOSE 86  BUN 24*  CREATININE 1.33*  CALCIUM 8.5*   GFR: Estimated Creatinine Clearance: 43.5 mL/min (by C-G formula based on SCr of 1.33 mg/dL (H)). Liver Function Tests:  Recent Labs Lab 07/03/16 1418  AST 24  ALT 12*  ALKPHOS 86  BILITOT 2.2*  PROT 6.4*  ALBUMIN 3.4*   No results for input(s):  LIPASE, AMYLASE in the last 168 hours. No results for input(s): AMMONIA in the last 168 hours. Coagulation Profile:  Recent Labs Lab 07/02/16 1225 07/03/16 1828  INR 2.9 2.74   Cardiac Enzymes:  Recent Labs Lab 07/03/16 1828  TROPONINI <0.03   BNP (last 3 results) No results for input(s): PROBNP in the last 8760 hours. HbA1C: No results for input(s): HGBA1C in the last 72 hours. CBG: No results for input(s): GLUCAP in the last 168 hours. Lipid Profile: No results for input(s): CHOL, HDL, LDLCALC, TRIG, CHOLHDL, LDLDIRECT in the last 72 hours. Thyroid Function Tests: No results for input(s): TSH, T4TOTAL, FREET4, T3FREE, THYROIDAB in the last 72 hours. Anemia Panel: No results for input(s): VITAMINB12, FOLATE, FERRITIN, TIBC, IRON, RETICCTPCT in the last 72 hours. Urine analysis:    Component Value Date/Time   COLORURINE YELLOW 07/03/2016 1900   APPEARANCEUR CLEAR 07/03/2016 1900   LABSPEC 1.015 07/03/2016 1900   PHURINE 5.5 07/03/2016 1900   GLUCOSEU NEGATIVE 07/03/2016 1900   HGBUR NEGATIVE  07/03/2016 1900   BILIRUBINUR NEGATIVE 07/03/2016 1900   KETONESUR NEGATIVE 07/03/2016 1900   PROTEINUR NEGATIVE 07/03/2016 1900   UROBILINOGEN 1.0 09/14/2012 2337   NITRITE NEGATIVE 07/03/2016 1900   LEUKOCYTESUR SMALL (A) 07/03/2016 1900    Radiological Exams on Admission: Dg Chest 2 View  Result Date: 07/03/2016 CLINICAL DATA:  75 y/o F; shortness of breath. History of heart failure EXAM: CHEST  2 VIEW COMPARISON:  06/12/2016 chest radiograph FINDINGS: Moderate left and small right pleural effusions. Associated bibasilar opacities. Lungs are otherwise clear. No pneumothorax. Cardiac silhouette is obscured by effusions. No acute osseous abnormality. IMPRESSION: Moderate left and small right pleural effusions are stable. Associated bibasilar opacities probably represent atelectasis. Pneumonia is not excluded. Electronically Signed   By: Kristine Garbe M.D.   On: 07/03/2016 18:40   Echocardiogram 11/30/2015  ------------------------------------------------------------------- LV EF: 60% -   65%  ------------------------------------------------------------------- Indications:      CHF - 428.0.  ------------------------------------------------------------------- History:   PMH:   Dyspnea.  Atrial fibrillation.  PMH:  Long term Anticoagulation  Risk factors:  Hypertension.  ------------------------------------------------------------------- Study Conclusions  - Left ventricle: The cavity size was normal. Wall thickness was   increased in a pattern of mild LVH. Systolic function was normal.   The estimated ejection fraction was in the range of 60% to 65%.   Wall motion was normal; there were no regional wall motion   abnormalities. The study was not technically sufficient to allow   evaluation of LV diastolic dysfunction due to atrial   fibrillation. - Mitral valve: Calcified annulus. There was mild regurgitation. - Left atrium: The atrium was severely dilated. - Right  ventricle: Systolic function was mildly reduced. - Right atrium: The atrium was severely dilated. - Tricuspid valve: There was mild regurgitation. - Pulmonary arteries: Systolic pressure was mildly to moderately   increased. PA peak pressure: 48 mm Hg (S). - Systemic veins: IVC dilated with normal respiratory variation.   Estimated CVP 8 mmHg.  EKG: Independently reviewed. Vent. rate 95 BPM PR interval * ms QRS duration 94 ms QT/QTc 402/506 ms P-R-T axes * 118 -8 Atrial fibrillation Right axis deviation Low voltage, extremity leads Prolonged QT interval Baseline wander  Assessment/Plan Principal Problem:   Cellulitis Admit to telemetry/inpatient. Continue vancomycin per pharmacy. Zosyn per pharmacy. Consult wound care. Consult case management and/or social services.  Active Problems:   Chronic atrial fibrillation (HCC) CHA2DS2-VASc Score of at least 5. Continue Cardizem CD 24-hour 240 mg  by mouth daily. Continue metoprolol 100 mg by mouth twice a day. Warfarin per pharmacy.    HTN (hypertension) Continue Cardizem 240 mg by mouth daily. Monitor blood pressure    Hypothyroidism Continue levothyroxine. Interval monitoring of TSH or as needed.    Thrombocytosis (HCC) Monitor platelets. On warfarin per pharmacy.    Anemia Monitor hematocrit and hemoglobin.    Leukocytosis Discussed by Dr. Thurnell Garbe with Dr. Tanna Savoy (oncology), who suggested to monitor wound clinically and not be guided by WBC in this instance given her chronic leukocytosis with left shift.     DVT prophylaxis: On warfarin. Code Status: Full code. Family Communication:  Disposition Plan: Admit for IV antibiotic therapy for 2-3 days. Consults called:  Admission status: Inpatient/telemetry.   Reubin Milan MD [facility] Pager [phone].  If 7PM-7AM, please contact night-coverage [email or web address] Password [email or web address]  07/03/2016, 10:40 PM

## 2016-07-04 ENCOUNTER — Other Ambulatory Visit (HOSPITAL_COMMUNITY): Payer: Medicare Other

## 2016-07-04 ENCOUNTER — Ambulatory Visit (HOSPITAL_COMMUNITY): Payer: Medicare Other | Admitting: Oncology

## 2016-07-04 DIAGNOSIS — L03032 Cellulitis of left toe: Secondary | ICD-10-CM

## 2016-07-04 LAB — COMPREHENSIVE METABOLIC PANEL
ALBUMIN: 3 g/dL — AB (ref 3.5–5.0)
ALK PHOS: 80 U/L (ref 38–126)
ALT: 10 U/L — ABNORMAL LOW (ref 14–54)
AST: 17 U/L (ref 15–41)
Anion gap: 8 (ref 5–15)
BILIRUBIN TOTAL: 2.4 mg/dL — AB (ref 0.3–1.2)
BUN: 20 mg/dL (ref 6–20)
CALCIUM: 8.1 mg/dL — AB (ref 8.9–10.3)
CO2: 24 mmol/L (ref 22–32)
Chloride: 105 mmol/L (ref 101–111)
Creatinine, Ser: 1.03 mg/dL — ABNORMAL HIGH (ref 0.44–1.00)
GFR calc Af Amer: >60 mL/min (ref >60)
GFR, EST NON AFRICAN AMERICAN: 53 mL/min — AB (ref >60)
GLUCOSE: 91 mg/dL (ref 65–99)
POTASSIUM: 3.5 mmol/L (ref 3.5–5.1)
Sodium: 137 mmol/L (ref 135–145)
TOTAL PROTEIN: 5.4 g/dL — AB (ref 6.5–8.1)

## 2016-07-04 LAB — CBC WITH DIFFERENTIAL/PLATELET
BASOS ABS: 0.4 10*3/uL — AB (ref 0.0–0.1)
BASOS PCT: 2 %
Eosinophils Absolute: 0.4 10*3/uL (ref 0.0–0.7)
Eosinophils Relative: 2 %
HEMATOCRIT: 36.3 % (ref 36.0–46.0)
HEMOGLOBIN: 10.5 g/dL — AB (ref 12.0–15.0)
LYMPHS PCT: 5 %
Lymphs Abs: 1.1 10*3/uL (ref 0.7–4.0)
MCH: 23.1 pg — ABNORMAL LOW (ref 26.0–34.0)
MCHC: 28.9 g/dL — AB (ref 30.0–36.0)
MCV: 79.8 fL (ref 78.0–100.0)
MONO ABS: 0.6 10*3/uL (ref 0.1–1.0)
MONOS PCT: 3 %
NEUTROS ABS: 20.6 10*3/uL — AB (ref 1.7–7.7)
NEUTROS PCT: 90 %
Platelets: 456 10*3/uL — ABNORMAL HIGH (ref 150–400)
RBC: 4.55 MIL/uL (ref 3.87–5.11)
RDW: 21.4 % — AB (ref 11.5–15.5)
WBC: 23 10*3/uL — ABNORMAL HIGH (ref 4.0–10.5)

## 2016-07-04 MED ORDER — IBUPROFEN 400 MG PO TABS
400.0000 mg | ORAL_TABLET | Freq: Three times a day (TID) | ORAL | Status: DC | PRN
  Administered 2016-07-04 (×2): 400 mg via ORAL
  Filled 2016-07-04 (×2): qty 1

## 2016-07-04 MED ORDER — WARFARIN SODIUM 5 MG PO TABS
2.5000 mg | ORAL_TABLET | Freq: Once | ORAL | Status: AC
  Administered 2016-07-04: 2.5 mg via ORAL
  Filled 2016-07-04: qty 1

## 2016-07-04 MED ORDER — WARFARIN - PHARMACIST DOSING INPATIENT: Status: DC

## 2016-07-04 MED ORDER — HYDROCERIN EX CREA
TOPICAL_CREAM | Freq: Two times a day (BID) | CUTANEOUS | Status: DC
  Administered 2016-07-04 – 2016-07-05 (×3): via TOPICAL
  Filled 2016-07-04: qty 113

## 2016-07-04 MED ORDER — CLINDAMYCIN HCL 150 MG PO CAPS
300.0000 mg | ORAL_CAPSULE | Freq: Four times a day (QID) | ORAL | Status: DC
  Administered 2016-07-04 – 2016-07-05 (×4): 300 mg via ORAL
  Filled 2016-07-04 (×4): qty 2

## 2016-07-04 NOTE — Consult Note (Signed)
[facility] Nurse wound consult note Reason for Consult: Patient with cellulitis, dry, flaking skin.  No open wounds/ulcers Wound type: infectious Pressure Ulcer POA: No Measurement:DIffuse presentation of erythema (L>R) with dry flaking skin on LEs Wound HDQ:QIWL Drainage (amount, consistency, odor) None Periwound:No wounds Dressing procedure/placement/frequency: I have provided Nursing with guidance via the Orders for twice daily cleansing and applying of a moisturizer/emollient.  Heels are to be floated off of bed surface to prevent pressure injury. [place] nursing team will not follow, but will remain available to this patient, the nursing and medical teams.  Please re-consult if needed. Thanks, Maudie Flakes, MSN, RN, [place], Arther Abbott  Pager# [phone]

## 2016-07-04 NOTE — Progress Notes (Signed)
PROGRESS NOTE    Charlotte [name]  ZCH:[id number] DOB: July 18, 1941 DOA: 07/03/2016 PCP: [name], MD     Brief Narrative:  75 y/o woman admitted to the hospital from home on 11/30 due to cellulitis of the left ankle that failed to respond to a 10 day course of clindamyicn as an OP. Admission requested.   Assessment & Plan:   Principal Problem:   Cellulitis Active Problems:   Chronic atrial fibrillation (HCC)   HTN (hypertension)   Hypothyroidism   Thrombocytosis (HCC)   Anemia   Cellulitis -Seems to be improving. -Will narrow abx to clindamycin as I do not believe she failed treatment course as she states the cellulitis is much better. I believe she just needs a longer course of abx. -As long as no worsening overnight, plan to DC home in am.  A Fib -Chronic; rate controlled. -Continue anticoagulation with coumadin.   DVT prophylaxis: coumadin Code Status: full code Family Communication: husband at bedside updated on plan of care and all questions answered Disposition Plan: likely home in am  Consultants:   none  Procedures:   none  Antimicrobials:  Anti-infectives    Start     Dose/Rate Route Frequency Ordered Stop   07/04/16 2130  vancomycin (VANCOCIN) 1,250 mg in sodium chloride 0.9 % 250 mL IVPB     1,250 mg 166.7 mL/hr over 90 Minutes Intravenous Every 24 hours 07/03/16 2104     07/03/16 2130  vancomycin (VANCOCIN) 1,500 mg in sodium chloride 0.9 % 500 mL IVPB     1,500 mg 250 mL/hr over 120 Minutes Intravenous  Once 07/03/16 2104 07/03/16 2338       Subjective: No complaints  Objective: Vitals:   07/03/16 2100 07/03/16 2300 07/04/16 0511 07/04/16 1432  BP: 141/91 125/78 120/63 124/74  Pulse: 87 92 86 84  Resp: 22 20 20 18   Temp:  97.6 F (36.4 C) 97.9 F (36.6 C) 98.2 F (36.8 C)  TempSrc:  Oral Oral   SpO2: 94% 94% 92% 94%  Weight:  81.6 kg (180 lb)    Height:        Intake/Output Summary (Last 24 hours) at 07/04/16  1632 Last data filed at 07/04/16 1100  Gross per 24 hour  Intake              720 ml  Output              900 ml  Net             -180 ml   Filed Weights   07/03/16 1356 07/03/16 2300  Weight: 80.7 kg (178 lb) 81.6 kg (180 lb)    Examination:  General exam: Alert, awake, oriented x 3 Respiratory system: Clear to auscultation. Respiratory effort normal. Cardiovascular system:RRR. No murmurs, rubs, gallops. Gastrointestinal system: Abdomen is nondistended, soft and nontender. No organomegaly or masses felt. Normal bowel sounds heard. Central nervous system: Alert and oriented. No focal neurological deficits. Extremities: some redness over left ankle Skin: No rashes, lesions or ulcers Psychiatry: Judgement and insight appear normal. Mood & affect appropriate.     Data Reviewed: I have personally reviewed following labs and imaging studies  CBC:  Recent Labs Lab 07/03/16 1418 07/04/16 0503  WBC 25.1* 23.0*  NEUTROABS 22.8* 20.6*  HGB 11.5* 10.5*  HCT 39.0 36.3  MCV 80.1 79.8  PLT 489* 287*   Basic Metabolic Panel:  Recent Labs Lab 07/03/16 1418 07/04/16 0503  NA 137 137  K  4.5 3.5  CL 103 105  CO2 25 24  GLUCOSE 86 91  BUN 24* 20  CREATININE 1.33* 1.03*  CALCIUM 8.5* 8.1*   GFR: Estimated Creatinine Clearance: 56.1 mL/min (by C-G formula based on SCr of 1.03 mg/dL (H)). Liver Function Tests:  Recent Labs Lab 07/03/16 1418 07/04/16 0503  AST 24 17  ALT 12* 10*  ALKPHOS 86 80  BILITOT 2.2* 2.4*  PROT 6.4* 5.4*  ALBUMIN 3.4* 3.0*   No results for input(s): LIPASE, AMYLASE in the last 168 hours. No results for input(s): AMMONIA in the last 168 hours. Coagulation Profile:  Recent Labs Lab 07/02/16 1225 07/03/16 1828  INR 2.9 2.74   Cardiac Enzymes:  Recent Labs Lab 07/03/16 1828  TROPONINI <0.03   BNP (last 3 results) No results for input(s): PROBNP in the last 8760 hours. HbA1C: No results for input(s): HGBA1C in the last 72  hours. CBG: No results for input(s): GLUCAP in the last 168 hours. Lipid Profile: No results for input(s): CHOL, HDL, LDLCALC, TRIG, CHOLHDL, LDLDIRECT in the last 72 hours. Thyroid Function Tests: No results for input(s): TSH, T4TOTAL, FREET4, T3FREE, THYROIDAB in the last 72 hours. Anemia Panel: No results for input(s): VITAMINB12, FOLATE, FERRITIN, TIBC, IRON, RETICCTPCT in the last 72 hours. Urine analysis:    Component Value Date/Time   COLORURINE YELLOW 07/03/2016 1900   APPEARANCEUR CLEAR 07/03/2016 1900   LABSPEC 1.015 07/03/2016 1900   PHURINE 5.5 07/03/2016 1900   GLUCOSEU NEGATIVE 07/03/2016 1900   HGBUR NEGATIVE 07/03/2016 1900   BILIRUBINUR NEGATIVE 07/03/2016 1900   KETONESUR NEGATIVE 07/03/2016 1900   PROTEINUR NEGATIVE 07/03/2016 1900   UROBILINOGEN 1.0 09/14/2012 2337   NITRITE NEGATIVE 07/03/2016 1900   LEUKOCYTESUR SMALL (A) 07/03/2016 1900   Sepsis Labs: @LABRCNTIP (procalcitonin:4,lacticidven:4)  )No results found for this or any previous visit (from the past 240 hour(s)).       Radiology Studies: Dg Chest 2 View  Result Date: 07/03/2016 CLINICAL DATA:  75 y/o F; shortness of breath. History of heart failure EXAM: CHEST  2 VIEW COMPARISON:  06/12/2016 chest radiograph FINDINGS: Moderate left and small right pleural effusions. Associated bibasilar opacities. Lungs are otherwise clear. No pneumothorax. Cardiac silhouette is obscured by effusions. No acute osseous abnormality. IMPRESSION: Moderate left and small right pleural effusions are stable. Associated bibasilar opacities probably represent atelectasis. Pneumonia is not excluded. Electronically Signed   By: Kristine Garbe M.D.   On: 07/03/2016 18:40        Scheduled Meds: . aspirin EC  81 mg Oral Daily  . cholecalciferol  5,000 Units Oral Daily  . diltiazem  240 mg Oral Daily  . gabapentin  100 mg Oral TID  . hydrocerin   Topical BID  . hydroxyurea  1,000 mg Oral Daily  .  levothyroxine  75 mcg Oral QAC breakfast  . metoprolol  100 mg Oral BID  . potassium chloride  20 mEq Oral Daily  . torsemide  40 mg Oral Daily  . vancomycin  1,250 mg Intravenous Q24H  . warfarin  2.5 mg Oral Once  . [START ON 07/05/2016] Warfarin - Pharmacist Dosing Inpatient   Does not apply Q24H   Continuous Infusions:   LOS: 1 day    Time spent: 25 minutes. Greater than 50% of this time was spent in direct contact with the patient coordinating care.     Lelon Frohlich, MD [facility] Pager [phone]  If 7PM-7AM, please contact night-coverage [email or web address] Password [email or web address] 07/04/2016, 4:32 PM

## 2016-07-04 NOTE — Progress Notes (Signed)
ANTICOAGULATION CONSULT NOTE - Initial Consult  Pharmacy Consult for COUMADIN (chronic Rx PTA) Indication: atrial fibrillation  No Known Allergies  Patient Measurements: Height: 6' (182.9 cm) Weight: 180 lb (81.6 kg) IBW/kg (Calculated) : 73.1  Vital Signs: Temp: 98.2 F (36.8 C) (12/01 1432) Temp Source: Oral (12/01 0511) BP: 124/74 (12/01 1432) Pulse Rate: 84 (12/01 1432)  Labs:  Recent Labs  07/02/16 1225 07/03/16 1418 07/03/16 1828 07/04/16 0503  HGB  --  11.5*  --  10.5*  HCT  --  39.0  --  36.3  PLT  --  489*  --  456*  LABPROT  --   --  29.6*  --   INR 2.9  --  2.74  --   CREATININE  --  1.33*  --  1.03*  TROPONINI  --   --  <0.03  --     Estimated Creatinine Clearance: 56.1 mL/min (by C-G formula based on SCr of 1.03 mg/dL (H)).   Medical History: Past Medical History:  Diagnosis Date  . Arthritis   . Atrial fibrillation, chronic ([place])   . Deaf   . Diastolic heart failure ([place])   . DVT (deep venous thrombosis) ([place])   . Essential hypertension   . Hypothyroidism   . On home O2   . Peripheral neuropathy ([place])   . Peripheral vascular disease ([place])   . 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 Ulcer of ankle ([place])    Bilateral 2015  . Varicose veins     Medications:  Prescriptions Prior to Admission  Medication Sig Dispense Refill Last Dose  . aspirin EC 81 MG tablet Take 81 mg by mouth daily.   07/03/2016 at Unknown time  . CARTIA XT 240 MG 24 hr capsule TAKE ONE CAPSULE BY MOUTH ONCE DAILY 90 capsule 3 07/03/2016 at Unknown time  . Cholecalciferol (VITAMIN D3) 5000 UNITS CAPS Take 1 capsule by mouth daily.   07/03/2016 at Unknown time  . clindamycin (CLEOCIN) 300 MG capsule Take 300 mg by mouth 3 (three) times daily. 10 day course starting on 06/23/2016   07/03/2016 at Unknown time  . gabapentin (NEURONTIN) 100 MG capsule Take 100 mg by mouth 3 (three) times daily.     07/03/2016 at Unknown time  . hydroxyurea (HYDREA) 500 MG capsule Take 2 capsules  (1,000 mg total) by mouth daily. May take with food to minimize GI side effects. 60 capsule 2 07/03/2016 at Unknown time  . levothyroxine (SYNTHROID, LEVOTHROID) 75 MCG tablet Take 75 mcg by mouth daily before breakfast.    07/03/2016 at Unknown time  . metoprolol (LOPRESSOR) 100 MG tablet Take 100 mg by mouth 2 (two) times daily.   07/03/2016 at [place]  . Potassium Chloride ER 20 MEQ TBCR Take 20 mEq by mouth daily. 30 tablet 1 07/03/2016 at Unknown time  . torsemide (DEMADEX) 20 MG tablet Take 2 tablets (40 mg total) by mouth daily. 180 tablet 3 07/03/2016 at Unknown time  . warfarin (COUMADIN) 5 MG tablet TAKE AS DIRECTED BY  COUMADIN  CLINIC (Patient taking differently: TAKE 2.5MG  ON MWF, THEN TAKE ONE TABLET ON ALL OTHER DAYS IN THE EVENING) 30 tablet 4 07/02/2016 at Unknown time  . clindamycin (CLEOCIN) 300 MG capsule Take 1 capsule (300 mg total) by mouth 4 (four) times daily. X 7 days (Patient not taking: Reported on 07/03/2016) 28 capsule 0 Not Taking at Unknown time    Assessment: Asked to continue coumadin from home for afib.  INR therapeutic.  Goal of Therapy:  INR 2-3 Monitor platelets by anticoagulation protocol: Yes   Plan:  Coumadin 2.5mg  po today x 1 (home dose) INR daily Monitor for s/sx bleeding  Hart Robinsons A 07/04/2016,4:28 PM

## 2016-07-05 LAB — PROTIME-INR
INR: 1.95
PROTHROMBIN TIME: 22.5 s — AB (ref 11.4–15.2)

## 2016-07-05 MED ORDER — CLINDAMYCIN HCL 300 MG PO CAPS: 300.0000 mg | ORAL_CAPSULE | Freq: Four times a day (QID) | ORAL | 0 refills | Status: DC

## 2016-07-05 MED ORDER — WARFARIN SODIUM 5 MG PO TABS: 5.0000 mg | ORAL_TABLET | Freq: Once | ORAL | Status: DC

## 2016-07-05 NOTE — Discharge Summary (Signed)
Physician Discharge Summary  Charlotte [name] SVX:[id number] DOB: 1940/09/07 DOA: 07/03/2016  PCP: Charlotte Kilts, MD  Admit date: 07/03/2016 Discharge date: 07/05/2016  Time spent: 45 minutes  Recommendations for Outpatient Follow-up:  -Will be discharged home today. -Advised to follow up with PCP in 2 weeks to monitor cellulitis.   Discharge Diagnoses:  Principal Problem:   Cellulitis Active Problems:   Chronic atrial fibrillation (HCC)   HTN (hypertension)   Hypothyroidism   Thrombocytosis (HCC)   Anemia   Discharge Condition: Stable and improved  Filed Weights   07/03/16 1356 07/03/16 2300  Weight: 80.7 kg (178 lb) 81.6 kg (180 lb)    History of present illness:  As per Dr. Olevia Bowens on 11/30: Charlotte [name] is a 75 y.o. female with medical history significant of osteoarthritis, chronic atrial fibrillation, diastolic heart failure, DVT, essential hypertension, hypothyroidism, peripheral neuropathy, peripheral vascular disease, but polycythemia vera who was coming to the emergency department due to worsening left lower extremity cellulitis despite treatment with oral clindamycin for the past 10 days. She denies fever, chills, night sweats, chest pain, dyspnea, palpitations, abdominal pain, nausea, emesis, constipation, melena or hematochezia. She states that she had self-limited diarrhea a few days ago.  ED Course: The patient received a NS bolus and IV vancomycin.   Hospital Course:   Cellulitis -Seems to be improving. -Will narrow abx to clindamycin as I do not believe she failed treatment course as she states the cellulitis is much better. I believe she just needs a longer course of abx. -Will give her an extra 10 days on clinda. -Picture on Dc:         A Fib -Chronic; rate controlled. -Continue anticoagulation with coumadin.    Procedures:  None   Consultations:  None  Discharge Instructions  Discharge Instructions    Diet - low  sodium heart healthy    Complete by:  As directed    Increase activity slowly    Complete by:  As directed        Medication List    TAKE these medications   aspirin EC 81 MG tablet Take 81 mg by mouth daily.   CARTIA XT 240 MG 24 hr capsule Generic drug:  diltiazem TAKE ONE CAPSULE BY MOUTH ONCE DAILY   clindamycin 300 MG capsule Commonly known as:  CLEOCIN Take 1 capsule (300 mg total) by mouth every 6 (six) hours. What changed:  when to take this  additional instructions  Another medication with the same name was removed. Continue taking this medication, and follow the directions you see here.   gabapentin 100 MG capsule Commonly known as:  NEURONTIN Take 100 mg by mouth 3 (three) times daily.   hydroxyurea 500 MG capsule Commonly known as:  HYDREA Take 2 capsules (1,000 mg total) by mouth daily. May take with food to minimize GI side effects.   levothyroxine 75 MCG tablet Commonly known as:  SYNTHROID, LEVOTHROID Take 75 mcg by mouth daily before breakfast.   metoprolol 100 MG tablet Commonly known as:  LOPRESSOR Take 100 mg by mouth 2 (two) times daily.   Potassium Chloride ER 20 MEQ Tbcr Take 20 mEq by mouth daily.   torsemide 20 MG tablet Commonly known as:  DEMADEX Take 2 tablets (40 mg total) by mouth daily.   Vitamin D3 5000 units Caps Take 1 capsule by mouth daily.   warfarin 5 MG tablet Commonly known as:  COUMADIN TAKE AS DIRECTED BY  COUMADIN  CLINIC What changed:  See the new instructions.      No Known Allergies Follow-up Information    Charlotte Kilts, MD. Schedule an appointment as soon as possible for a visit in 2 week(s).   Specialty:  Family Medicine Contact information: [address] [place] [address] [phone]            The results of significant diagnostics from this hospitalization (including imaging, microbiology, ancillary and laboratory) are listed below for reference.    Significant  Diagnostic Studies: Dg Chest 2 View  Result Date: 07/03/2016 CLINICAL DATA:  75 y/o F; shortness of breath. History of heart failure EXAM: CHEST  2 VIEW COMPARISON:  06/12/2016 chest radiograph FINDINGS: Moderate left and small right pleural effusions. Associated bibasilar opacities. Lungs are otherwise clear. No pneumothorax. Cardiac silhouette is obscured by effusions. No acute osseous abnormality. IMPRESSION: Moderate left and small right pleural effusions are stable. Associated bibasilar opacities probably represent atelectasis. Pneumonia is not excluded. Electronically Signed   By: Kristine Garbe M.D.   On: 07/03/2016 18:40   Dg Chest 2 View  Result Date: 06/12/2016 CLINICAL DATA:  Hypoxia EXAM: CHEST  2 VIEW COMPARISON:  04/10/2016 FINDINGS: Bilateral pleural effusion, left more than right. The left effusion is low moderate volume. The underlying lung is obscured. No visible air bronchogram. Atelectatic changes seen. Pulmonary vascular congestion. IMPRESSION: 1. Left more than right pleural effusion, chronic based on previous studies. Left effusion is moderate volume. 2. Pulmonary vascular congestion. Electronically Signed   By: Monte Fantasia M.D.   On: 06/12/2016 13:58    Microbiology: Recent Results (from the past 240 hour(s))  Urine culture     Status: Abnormal (Preliminary result)   Collection Time: 07/03/16  7:00 PM  Result Value Ref Range Status   Specimen Description URINE, CLEAN CATCH  Final   Special Requests NONE  Final   Culture 80,000 COLONIES/mL GRAM NEGATIVE RODS (A)  Final   Report Status PENDING  Incomplete     Labs: Basic Metabolic Panel:  Recent Labs Lab 07/03/16 1418 07/04/16 0503  NA 137 137  K 4.5 3.5  CL 103 105  CO2 25 24  GLUCOSE 86 91  BUN 24* 20  CREATININE 1.33* 1.03*  CALCIUM 8.5* 8.1*   Liver Function Tests:  Recent Labs Lab 07/03/16 1418 07/04/16 0503  AST 24 17  ALT 12* 10*  ALKPHOS 86 80  BILITOT 2.2* 2.4*  PROT 6.4*  5.4*  ALBUMIN 3.4* 3.0*   No results for input(s): LIPASE, AMYLASE in the last 168 hours. No results for input(s): AMMONIA in the last 168 hours. CBC:  Recent Labs Lab 07/03/16 1418 07/04/16 0503  WBC 25.1* 23.0*  NEUTROABS 22.8* 20.6*  HGB 11.5* 10.5*  HCT 39.0 36.3  MCV 80.1 79.8  PLT 489* 456*   Cardiac Enzymes:  Recent Labs Lab 07/03/16 1828  TROPONINI <0.03   BNP: BNP (last 3 results)  Recent Labs  11/21/15 1439 11/29/15 1349 07/03/16 1828  BNP 815.0* 816.0* 678.0*    ProBNP (last 3 results) No results for input(s): PROBNP in the last 8760 hours.  CBG: No results for input(s): GLUCAP in the last 168 hours.     SignedLelon Frohlich  [facility] Pager: [phone] 07/05/2016, 12:31 PM

## 2016-07-05 NOTE — Progress Notes (Signed)
Client is stable at this time, reviewed discharge instructions, educated on s/s to report to md/ems, educated on follow up appointments, reviewed medication list including next dose due. Client to be discharged to home with spouse, client and spouse voiced understanding to instructions provided.

## 2016-07-05 NOTE — Progress Notes (Signed)
[place] for COUMADIN (chronic Rx PTA) Indication: atrial fibrillation  No Known Allergies  Patient Measurements: Height: 6' (182.9 cm) Weight: 180 lb (81.6 kg) IBW/kg (Calculated) : 73.1  Vital Signs: Temp: 97.9 F (36.6 C) (12/02 0440) Temp Source: Oral (12/02 0440) BP: 135/72 (12/02 0440) Pulse Rate: 76 (12/02 0440)  Labs:  Recent Labs  07/02/16 1225 07/03/16 1418 07/03/16 1828 07/04/16 0503 07/05/16 0633  HGB  --  11.5*  --  10.5*  --   HCT  --  39.0  --  36.3  --   PLT  --  489*  --  456*  --   LABPROT  --   --  29.6*  --  22.5*  INR 2.9  --  2.74  --  1.95  CREATININE  --  1.33*  --  1.03*  --   TROPONINI  --   --  <0.03  --   --     Estimated Creatinine Clearance: 56.1 mL/min (by C-G formula based on SCr of 1.03 mg/dL (H)).   Medical History: Past Medical History:  Diagnosis Date  . Arthritis   . Atrial fibrillation, chronic ([place])   . Deaf   . Diastolic heart failure ([place])   . DVT (deep venous thrombosis) ([place])   . Essential hypertension   . Hypothyroidism   . On home O2   . Peripheral neuropathy ([place])   . Peripheral vascular disease ([place])   . 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 Ulcer of ankle ([place])    Bilateral 2015  . Varicose veins     Medications:  Prescriptions Prior to Admission  Medication Sig Dispense Refill Last Dose  . aspirin EC 81 MG tablet Take 81 mg by mouth daily.   07/03/2016 at Unknown time  . CARTIA XT 240 MG 24 hr capsule TAKE ONE CAPSULE BY MOUTH ONCE DAILY 90 capsule 3 07/03/2016 at Unknown time  . Cholecalciferol (VITAMIN D3) 5000 UNITS CAPS Take 1 capsule by mouth daily.   07/03/2016 at Unknown time  . clindamycin (CLEOCIN) 300 MG capsule Take 300 mg by mouth 3 (three) times daily. 10 day course starting on 06/23/2016   07/03/2016 at Unknown time  . gabapentin (NEURONTIN) 100 MG capsule Take 100 mg by mouth 3 (three) times daily.     07/03/2016 at Unknown time  . hydroxyurea  (HYDREA) 500 MG capsule Take 2 capsules (1,000 mg total) by mouth daily. May take with food to minimize GI side effects. 60 capsule 2 07/03/2016 at Unknown time  . levothyroxine (SYNTHROID, LEVOTHROID) 75 MCG tablet Take 75 mcg by mouth daily before breakfast.    07/03/2016 at Unknown time  . metoprolol (LOPRESSOR) 100 MG tablet Take 100 mg by mouth 2 (two) times daily.   07/03/2016 at [place]  . Potassium Chloride ER 20 MEQ TBCR Take 20 mEq by mouth daily. 30 tablet 1 07/03/2016 at Unknown time  . torsemide (DEMADEX) 20 MG tablet Take 2 tablets (40 mg total) by mouth daily. 180 tablet 3 07/03/2016 at Unknown time  . warfarin (COUMADIN) 5 MG tablet TAKE AS DIRECTED BY  COUMADIN  CLINIC (Patient taking differently: TAKE 2.5MG  ON MWF, THEN TAKE ONE TABLET ON ALL OTHER DAYS IN THE EVENING) 30 tablet 4 07/02/2016 at Unknown time  . clindamycin (CLEOCIN) 300 MG capsule Take 1 capsule (300 mg total) by mouth 4 (four) times daily. X 7 days (Patient not taking: Reported on 07/03/2016) 28 capsule 0 Not Taking at Unknown time  Assessment: Asked to continue coumadin from home for afib.  INR trending down.    Goal of Therapy:  INR 2-3 Monitor platelets by anticoagulation protocol: Yes   Plan:  Coumadin 5mg  po today x 1 INR daily Monitor for s/sx bleeding  Hart Robinsons A 07/05/2016,8:29 AM

## 2016-07-06 LAB — URINE CULTURE

## 2016-07-07 ENCOUNTER — Telehealth: Payer: Self-pay | Admitting: *Deleted

## 2016-07-07 DIAGNOSIS — I4891 Unspecified atrial fibrillation: Secondary | ICD-10-CM | POA: Diagnosis not present

## 2016-07-07 DIAGNOSIS — Z7901 Long term (current) use of anticoagulants: Secondary | ICD-10-CM | POA: Diagnosis not present

## 2016-07-07 DIAGNOSIS — L03116 Cellulitis of left lower limb: Secondary | ICD-10-CM | POA: Diagnosis not present

## 2016-07-07 DIAGNOSIS — Z9981 Dependence on supplemental oxygen: Secondary | ICD-10-CM | POA: Diagnosis not present

## 2016-07-07 DIAGNOSIS — I5032 Chronic diastolic (congestive) heart failure: Secondary | ICD-10-CM | POA: Diagnosis not present

## 2016-07-07 NOTE — Telephone Encounter (Signed)
Order given for [facility] nurse to check INR on 07/09/16.  Spoke with Benjamine Mola.

## 2016-07-07 NOTE — Telephone Encounter (Signed)
Has questions regarding INR check / tg

## 2016-07-09 ENCOUNTER — Telehealth: Payer: Self-pay | Admitting: *Deleted

## 2016-07-09 ENCOUNTER — Ambulatory Visit (INDEPENDENT_AMBULATORY_CARE_PROVIDER_SITE_OTHER): Payer: Medicare Other | Admitting: *Deleted

## 2016-07-09 DIAGNOSIS — Z9981 Dependence on supplemental oxygen: Secondary | ICD-10-CM | POA: Diagnosis not present

## 2016-07-09 DIAGNOSIS — I5032 Chronic diastolic (congestive) heart failure: Secondary | ICD-10-CM | POA: Diagnosis not present

## 2016-07-09 DIAGNOSIS — Z5181 Encounter for therapeutic drug level monitoring: Secondary | ICD-10-CM

## 2016-07-09 DIAGNOSIS — L03116 Cellulitis of left lower limb: Secondary | ICD-10-CM | POA: Diagnosis not present

## 2016-07-09 DIAGNOSIS — I482 Chronic atrial fibrillation, unspecified: Secondary | ICD-10-CM

## 2016-07-09 DIAGNOSIS — Z7901 Long term (current) use of anticoagulants: Secondary | ICD-10-CM | POA: Diagnosis not present

## 2016-07-09 DIAGNOSIS — I4891 Unspecified atrial fibrillation: Secondary | ICD-10-CM | POA: Diagnosis not present

## 2016-07-09 LAB — POCT INR: INR: 2.8

## 2016-07-09 NOTE — Telephone Encounter (Signed)
Done.  See coumadin note. 

## 2016-07-09 NOTE — Telephone Encounter (Signed)
INR 2.8  Per Maudie Mercury [phone]

## 2016-07-10 DIAGNOSIS — L03119 Cellulitis of unspecified part of limb: Secondary | ICD-10-CM | POA: Diagnosis not present

## 2016-07-10 DIAGNOSIS — I509 Heart failure, unspecified: Secondary | ICD-10-CM | POA: Diagnosis not present

## 2016-07-10 DIAGNOSIS — E876 Hypokalemia: Secondary | ICD-10-CM | POA: Diagnosis not present

## 2016-07-10 DIAGNOSIS — Z1389 Encounter for screening for other disorder: Secondary | ICD-10-CM | POA: Diagnosis not present

## 2016-07-10 DIAGNOSIS — Z6824 Body mass index (BMI) 24.0-24.9, adult: Secondary | ICD-10-CM | POA: Diagnosis not present

## 2016-07-11 DIAGNOSIS — Z9981 Dependence on supplemental oxygen: Secondary | ICD-10-CM | POA: Diagnosis not present

## 2016-07-11 DIAGNOSIS — I4891 Unspecified atrial fibrillation: Secondary | ICD-10-CM | POA: Diagnosis not present

## 2016-07-11 DIAGNOSIS — I5032 Chronic diastolic (congestive) heart failure: Secondary | ICD-10-CM | POA: Diagnosis not present

## 2016-07-11 DIAGNOSIS — L03116 Cellulitis of left lower limb: Secondary | ICD-10-CM | POA: Diagnosis not present

## 2016-07-11 DIAGNOSIS — Z7901 Long term (current) use of anticoagulants: Secondary | ICD-10-CM | POA: Diagnosis not present

## 2016-07-14 DIAGNOSIS — L03116 Cellulitis of left lower limb: Secondary | ICD-10-CM | POA: Diagnosis not present

## 2016-07-14 DIAGNOSIS — I4891 Unspecified atrial fibrillation: Secondary | ICD-10-CM | POA: Diagnosis not present

## 2016-07-14 DIAGNOSIS — Z7901 Long term (current) use of anticoagulants: Secondary | ICD-10-CM | POA: Diagnosis not present

## 2016-07-14 DIAGNOSIS — I5032 Chronic diastolic (congestive) heart failure: Secondary | ICD-10-CM | POA: Diagnosis not present

## 2016-07-14 DIAGNOSIS — Z9981 Dependence on supplemental oxygen: Secondary | ICD-10-CM | POA: Diagnosis not present

## 2016-07-16 ENCOUNTER — Ambulatory Visit (INDEPENDENT_AMBULATORY_CARE_PROVIDER_SITE_OTHER): Payer: Medicare Other | Admitting: *Deleted

## 2016-07-16 DIAGNOSIS — I4891 Unspecified atrial fibrillation: Secondary | ICD-10-CM | POA: Diagnosis not present

## 2016-07-16 DIAGNOSIS — I482 Chronic atrial fibrillation, unspecified: Secondary | ICD-10-CM

## 2016-07-16 DIAGNOSIS — I5032 Chronic diastolic (congestive) heart failure: Secondary | ICD-10-CM | POA: Diagnosis not present

## 2016-07-16 DIAGNOSIS — L03116 Cellulitis of left lower limb: Secondary | ICD-10-CM | POA: Diagnosis not present

## 2016-07-16 DIAGNOSIS — Z9981 Dependence on supplemental oxygen: Secondary | ICD-10-CM | POA: Diagnosis not present

## 2016-07-16 DIAGNOSIS — Z5181 Encounter for therapeutic drug level monitoring: Secondary | ICD-10-CM

## 2016-07-16 DIAGNOSIS — Z7901 Long term (current) use of anticoagulants: Secondary | ICD-10-CM | POA: Diagnosis not present

## 2016-07-16 LAB — POCT INR: INR: 2.6

## 2016-07-17 ENCOUNTER — Encounter: Payer: Self-pay | Admitting: Cardiovascular Disease

## 2016-07-17 ENCOUNTER — Ambulatory Visit (INDEPENDENT_AMBULATORY_CARE_PROVIDER_SITE_OTHER): Payer: Medicare Other | Admitting: Cardiovascular Disease

## 2016-07-17 VITALS — BP 90/56 | HR 75 | Ht 72.0 in | Wt 180.0 lb

## 2016-07-17 DIAGNOSIS — I10 Essential (primary) hypertension: Secondary | ICD-10-CM

## 2016-07-17 DIAGNOSIS — I482 Chronic atrial fibrillation, unspecified: Secondary | ICD-10-CM

## 2016-07-17 DIAGNOSIS — I5031 Acute diastolic (congestive) heart failure: Secondary | ICD-10-CM

## 2016-07-17 DIAGNOSIS — I739 Peripheral vascular disease, unspecified: Secondary | ICD-10-CM

## 2016-07-17 DIAGNOSIS — Z79899 Other long term (current) drug therapy: Secondary | ICD-10-CM | POA: Diagnosis not present

## 2016-07-17 MED ORDER — METOLAZONE 2.5 MG PO TABS: ORAL_TABLET | ORAL | 3 refills | Status: DC

## 2016-07-17 NOTE — Patient Instructions (Addendum)
Your physician recommends that you schedule a follow-up appointment in: 2 months Dr Bronson Ing   Take Metolazone 2.5 mg on Monday,Wednesday, and Fridays   Get lab work in 2 weeks:BMET  Your physician has requested that you have an ankle brachial index (ABI). During this test an ultrasound and blood pressure cuff are used to evaluate the arteries that supply the arms and legs with blood. Allow thirty minutes for this exam. There are no restrictions or special instructions.   Your physician has requested that you have a lower extremity arterial duplex. This test is an ultrasound of the arteries in the legs or arms. It looks at arterial blood flow in the legs. Allow one hour for Lower and Upper Arterial scans. There are no restrictions or special instructions  You have been referred to Vascular Surgery in [facility], vascular and [facility]      Thank you for choosing [facility] !

## 2016-07-17 NOTE — Progress Notes (Signed)
SUBJECTIVE: The patient presents for follow-up of chronic atrial fibrillation. She was recently hospitalized for cellulitis.  She also has a history of chronic diastolic heart failure, polycythemia vera, HTN, and PVD with a h/o right fem-pop bypass and endarterectomy.   Echocardiogram 11/30/15 showed normal left ventricular systolic function, LVEF 35-59%, mild LVH, mild mitral and tricuspid regurgitation, severe biatrial dilatation, moderately elevated pulmonary pressures 48 mmHg.  She has difficulty breathing when lying down. She props her legs up on 3 pillows to alleviate swelling. She has orthopnea. She now uses between 3 and 3.5 L of oxygen. Denies chest pain and palpitations. She has had more difficulty recently with lower extremity wound healing.  ABIs 03/29/13: Left 1.11, right 1.24.  07/04/16: BUN 20, creatinine 1.03.   Review of Systems: As per "subjective", otherwise negative.  No Known Allergies  Current Outpatient Prescriptions  Medication Sig Dispense Refill  . aspirin EC 81 MG tablet Take 81 mg by mouth daily.    Marland Kitchen CARTIA XT 240 MG 24 hr capsule TAKE ONE CAPSULE BY MOUTH ONCE DAILY 90 capsule 3  . Cholecalciferol (VITAMIN D3) 5000 UNITS CAPS Take 1 capsule by mouth daily.    . clindamycin (CLEOCIN) 300 MG capsule Take 1 capsule (300 mg total) by mouth every 6 (six) hours. 40 capsule 0  . gabapentin (NEURONTIN) 100 MG capsule Take 100 mg by mouth 3 (three) times daily.      . hydroxyurea (HYDREA) 500 MG capsule Take 2 capsules (1,000 mg total) by mouth daily. May take with food to minimize GI side effects. 60 capsule 2  . levothyroxine (SYNTHROID, LEVOTHROID) 75 MCG tablet Take 75 mcg by mouth daily before breakfast.     . metoprolol (LOPRESSOR) 100 MG tablet Take 100 mg by mouth 2 (two) times daily.    . Potassium Chloride ER 20 MEQ TBCR Take 20 mEq by mouth daily. 30 tablet 1  . torsemide (DEMADEX) 20 MG tablet Take 2 tablets (40 mg total) by mouth daily. 180 tablet  3  . warfarin (COUMADIN) 5 MG tablet TAKE AS DIRECTED BY  COUMADIN  CLINIC (Patient taking differently: TAKE 2.5MG  ON MWF, THEN TAKE ONE TABLET ON ALL OTHER DAYS IN THE EVENING) 30 tablet 4   No current facility-administered medications for this visit.     Past Medical History:  Diagnosis Date  . Arthritis   . Atrial fibrillation, chronic ([place])   . Deaf   . Diastolic heart failure ([place])   . DVT (deep venous thrombosis) ([place])   . Essential hypertension   . Hypothyroidism   . On home O2   . Peripheral neuropathy ([place])   . Peripheral vascular disease ([place])   . 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 Ulcer of ankle ([place])    Bilateral 2015  . Varicose veins     Past Surgical History:  Procedure Laterality Date  . ABDOMINAL AORTAGRAM N/A 09/14/2012   Procedure: ABDOMINAL [name];  Surgeon: Serafina Mitchell, MD;  Location: [facility] C[facility];  Service: Cardiovascular;  Laterality: N/A;  . ABDOMINAL HYSTERECTOMY    . CORONARY ANGIOPLASTY    . DRESSING CHANGE UNDER ANESTHESIA  12/02/2011   Procedure: DRESSING CHANGE UNDER ANESTHESIA;  Surgeon: Carole Civil, MD;  Location: [facility];  Service: Orthopedics;  Laterality: Right;  . ENDARTERECTOMY FEMORAL Right 09/15/2012   Procedure: ENDARTERECTOMY FEMORAL;  Surgeon: Mal Misty, MD;  Location: [facility];  Service: Vascular;  Laterality: Right;  . FASCIOTOMY  11/29/2011   Procedure:  FASCIOTOMY;  Surgeon: Carole Civil, MD;  Location: [facility];  Service: Orthopedics;  Laterality: Right;  right thigh   . FEMORAL-POPLITEAL BYPASS GRAFT Right 09/15/2012   Procedure: BYPASS GRAFT FEMORAL-POPLITEAL ARTERY;  Surgeon: Mal Misty, MD;  Location: [facility];  Service: Vascular;  Laterality: Right;  . HIP PINNING,CANNULATED  11/19/2011   Procedure: CANNULATED HIP PINNING;  Surgeon: Sanjuana Kava, MD;  Location: [facility];  Service: Orthopedics;  Laterality: Right;  . PATCH ANGIOPLASTY Right 09/15/2012   Procedure: PATCH ANGIOPLASTY;  Surgeon: Mal Misty, MD;   Location: [facility];  Service: Vascular;  Laterality: Right;  . TUBAL LIGATION      Social History   Social History  . Marital status: Married    Spouse name: N/A  . Number of children: N/A  . Years of education: N/A   Occupational History  . Not on file.   Social History Main Topics  . Smoking status: Never Smoker  . Smokeless tobacco: Never Used  . Alcohol use No  . Drug use: No  . Sexual activity: Not Currently   Other Topics Concern  . Not on file   Social History Narrative  . No narrative on file     Vitals:   07/17/16 1306  BP: (!) 90/56  Pulse: 75  SpO2: (!) 89%  Weight: 180 lb (81.6 kg)  Height: 6' (1.829 m)    PHYSICAL EXAM General: NAD, hard of hearing, using O2 by nasal cannula Neck: No JVD, no thyromegaly. Lungs: Diminished but clear to auscultation bilaterally with normal respiratory effort. CV: Nondisplaced PMI. Regular rate,irregular rhythm, normal S1/S2, no S3, no murmur. 1+ pitting pretibial edema b/l, left leg bandaged. No carotid bruit. Bilateral venous varicosities.  Abdomen: Soft, nontender, no distention.  Neurologic: Alert and oriented x 3.  Psych: Normal affect. Skin: Normal.    ECG: Most recent ECG reviewed.      ASSESSMENT AND PLAN: 1. Chronic atrial fibrillation: Symptomatically stable. Rate is controlled on current doses of metoprolol and diltiazem. Will continue warfarin.   2. Essential HTN: Hypotensive. However, needs more diuresis.  3. PVD: Should follow up with vascular surgery. I will obtain ABI's. On ASA.  4. Acute on chronic diastolic heart failure: Edematous and orthopneic on torsemide 40 mg daily. Will add metolazone 2.5 mg every Mon, Wed, Fri. Check BMET next week.  Dispo: f/u 2 months   Kate [place], M.D., F.A.C.C.

## 2016-07-21 ENCOUNTER — Telehealth: Payer: Self-pay

## 2016-07-21 DIAGNOSIS — I5032 Chronic diastolic (congestive) heart failure: Secondary | ICD-10-CM | POA: Diagnosis not present

## 2016-07-21 DIAGNOSIS — Z7901 Long term (current) use of anticoagulants: Secondary | ICD-10-CM | POA: Diagnosis not present

## 2016-07-21 DIAGNOSIS — I4891 Unspecified atrial fibrillation: Secondary | ICD-10-CM | POA: Diagnosis not present

## 2016-07-21 DIAGNOSIS — L03116 Cellulitis of left lower limb: Secondary | ICD-10-CM | POA: Diagnosis not present

## 2016-07-21 DIAGNOSIS — Z9981 Dependence on supplemental oxygen: Secondary | ICD-10-CM | POA: Diagnosis not present

## 2016-07-21 NOTE — Telephone Encounter (Signed)
-----   Message from Herminio Commons, MD sent at 07/21/2016  4:15 PM EST ----- Regarding: RE: zaroxolyn denied by pharmacy Then increase torsemide to 40 mg q am and 20 mg q pm x 5 days and then back to 40 mg daily.  ----- Message ----- From: Bernita Raisin, RN Sent: 07/21/2016   9:19 AM To: Herminio Commons, MD Subject: zaroxolyn denied by pharmacy                   Zaroxolyn is Non formulary, they want her to try chlorthalidone,indapamide, hctz,chlorothiazide

## 2016-07-21 NOTE — Telephone Encounter (Signed)
I explained to pt that insurance will not cover zaroxolyn, and she is to take 20 mg extra demadex for 5 days in the pm

## 2016-07-25 DIAGNOSIS — Z9981 Dependence on supplemental oxygen: Secondary | ICD-10-CM | POA: Diagnosis not present

## 2016-07-25 DIAGNOSIS — Z7901 Long term (current) use of anticoagulants: Secondary | ICD-10-CM | POA: Diagnosis not present

## 2016-07-25 DIAGNOSIS — I4891 Unspecified atrial fibrillation: Secondary | ICD-10-CM | POA: Diagnosis not present

## 2016-07-25 DIAGNOSIS — L03116 Cellulitis of left lower limb: Secondary | ICD-10-CM | POA: Diagnosis not present

## 2016-07-25 DIAGNOSIS — I5032 Chronic diastolic (congestive) heart failure: Secondary | ICD-10-CM | POA: Diagnosis not present

## 2016-07-30 ENCOUNTER — Ambulatory Visit (INDEPENDENT_AMBULATORY_CARE_PROVIDER_SITE_OTHER): Payer: Medicare Other | Admitting: *Deleted

## 2016-07-30 ENCOUNTER — Other Ambulatory Visit: Payer: Self-pay | Admitting: Cardiovascular Disease

## 2016-07-30 DIAGNOSIS — I482 Chronic atrial fibrillation, unspecified: Secondary | ICD-10-CM

## 2016-07-30 DIAGNOSIS — Z5181 Encounter for therapeutic drug level monitoring: Secondary | ICD-10-CM

## 2016-07-30 DIAGNOSIS — L03116 Cellulitis of left lower limb: Secondary | ICD-10-CM | POA: Diagnosis not present

## 2016-07-30 DIAGNOSIS — I5032 Chronic diastolic (congestive) heart failure: Secondary | ICD-10-CM | POA: Diagnosis not present

## 2016-07-30 DIAGNOSIS — Z9981 Dependence on supplemental oxygen: Secondary | ICD-10-CM | POA: Diagnosis not present

## 2016-07-30 DIAGNOSIS — I4891 Unspecified atrial fibrillation: Secondary | ICD-10-CM | POA: Diagnosis not present

## 2016-07-30 DIAGNOSIS — Z7901 Long term (current) use of anticoagulants: Secondary | ICD-10-CM | POA: Diagnosis not present

## 2016-07-30 LAB — POCT INR: INR: 3

## 2016-07-31 ENCOUNTER — Telehealth: Payer: Self-pay

## 2016-07-31 DIAGNOSIS — Z79899 Other long term (current) drug therapy: Secondary | ICD-10-CM | POA: Diagnosis not present

## 2016-07-31 LAB — BASIC METABOLIC PANEL
BUN: 39 mg/dL — AB (ref 7–25)
CHLORIDE: 100 mmol/L (ref 98–110)
CO2: 29 mmol/L (ref 20–31)
CREATININE: 1.27 mg/dL — AB (ref 0.60–0.93)
Calcium: 9 mg/dL (ref 8.6–10.4)
Glucose, Bld: 80 mg/dL (ref 65–99)
POTASSIUM: 4.3 mmol/L (ref 3.5–5.3)
Sodium: 139 mmol/L (ref 135–146)

## 2016-07-31 NOTE — Telephone Encounter (Signed)
Pt is not on Metolazone,insurance wouldn't cover (see note from 07/21/16) .You had her take an extra 20 mg Demadex in the pm for 5 days, which should have ended on 07/26/16, BUT , pt kept taking extra PM dose until I just spoke with her.She repeated to me that she will only take 40 mg daily now and repeat labs in 2 weeks

## 2016-07-31 NOTE — Telephone Encounter (Signed)
-----   Message from Herminio Commons, MD sent at 07/31/2016  3:46 PM EST ----- Cut back metolazone to 2.5 mg every Monday and Friday. Repeat BMET in 2 weeks.

## 2016-08-01 DIAGNOSIS — I5032 Chronic diastolic (congestive) heart failure: Secondary | ICD-10-CM | POA: Diagnosis not present

## 2016-08-01 DIAGNOSIS — I4891 Unspecified atrial fibrillation: Secondary | ICD-10-CM | POA: Diagnosis not present

## 2016-08-01 DIAGNOSIS — L03116 Cellulitis of left lower limb: Secondary | ICD-10-CM | POA: Diagnosis not present

## 2016-08-01 DIAGNOSIS — Z7901 Long term (current) use of anticoagulants: Secondary | ICD-10-CM | POA: Diagnosis not present

## 2016-08-01 DIAGNOSIS — Z9981 Dependence on supplemental oxygen: Secondary | ICD-10-CM | POA: Diagnosis not present

## 2016-08-06 ENCOUNTER — Ambulatory Visit: Payer: Medicare Other

## 2016-08-06 ENCOUNTER — Telehealth: Payer: Self-pay | Admitting: Cardiovascular Disease

## 2016-08-06 DIAGNOSIS — L03116 Cellulitis of left lower limb: Secondary | ICD-10-CM | POA: Diagnosis not present

## 2016-08-06 DIAGNOSIS — Z7901 Long term (current) use of anticoagulants: Secondary | ICD-10-CM | POA: Diagnosis not present

## 2016-08-06 DIAGNOSIS — R202 Paresthesia of skin: Secondary | ICD-10-CM | POA: Diagnosis not present

## 2016-08-06 DIAGNOSIS — I739 Peripheral vascular disease, unspecified: Secondary | ICD-10-CM

## 2016-08-06 DIAGNOSIS — I5032 Chronic diastolic (congestive) heart failure: Secondary | ICD-10-CM | POA: Diagnosis not present

## 2016-08-06 DIAGNOSIS — Z9981 Dependence on supplemental oxygen: Secondary | ICD-10-CM | POA: Diagnosis not present

## 2016-08-06 DIAGNOSIS — I4891 Unspecified atrial fibrillation: Secondary | ICD-10-CM | POA: Diagnosis not present

## 2016-08-06 LAB — VAS US LOWER EXTREMITY ARTERIAL DUPLEX
RIGHT ANT DIST TIBAL SYS PSV: 35 cm/s
RIGHT POST TIB DIST SYS: 45 cm/s
Right peroneal sys PSV: 25 cm/s
Right popliteal dist sys PSV: 43 cm/s

## 2016-08-06 NOTE — Telephone Encounter (Signed)
Please give [facility] home [facility] nurse Shara Blazing a call @ [phone] --she'd like to speak w/ someone about pt's weight gain and DVT study

## 2016-08-07 NOTE — Telephone Encounter (Signed)
Pt was taking torsemide 40 mg dialy , I advised her to take 40 bid for next two days and then resume 40 daily. She voiced understanding.

## 2016-08-07 NOTE — Telephone Encounter (Signed)
Dr Marthann Schiller last note and her med list indicate she is to be taking torsemide 40mg  once daily. Is she taking 40mg  bid? Please clarify how she is taking, ok to take additional torsemide dose x 2 days then resumre prior regimen  Zandra Abts MD

## 2016-08-07 NOTE — Telephone Encounter (Signed)
Returned call to [place] ([facility]) and she was concerned that the pt has had a 4 lb weight gain since Saturday. ( she is 176 lbs and was 172 lbs.)She is having some SOB( but no more than usual)  her right ankle is swollen and  weeping a little, but complains of no chest pain or other symptoms.  She was taking torsemide three times daily, but was recently told to decrease to two times daily per Dr. Bronson Ing after labs. She does have a few metolazone 2.5 pills left and was wondering if she could take 1. Please advise.

## 2016-08-08 ENCOUNTER — Telehealth: Payer: Self-pay

## 2016-08-08 DIAGNOSIS — Z7901 Long term (current) use of anticoagulants: Secondary | ICD-10-CM | POA: Diagnosis not present

## 2016-08-08 DIAGNOSIS — L03116 Cellulitis of left lower limb: Secondary | ICD-10-CM | POA: Diagnosis not present

## 2016-08-08 DIAGNOSIS — Z9981 Dependence on supplemental oxygen: Secondary | ICD-10-CM | POA: Diagnosis not present

## 2016-08-08 DIAGNOSIS — I4891 Unspecified atrial fibrillation: Secondary | ICD-10-CM | POA: Diagnosis not present

## 2016-08-08 DIAGNOSIS — I5032 Chronic diastolic (congestive) heart failure: Secondary | ICD-10-CM | POA: Diagnosis not present

## 2016-08-08 NOTE — Telephone Encounter (Signed)
-----   Message from Laurine Blazer, LPN sent at 09/04/310  8:46 AM EST -----   ----- Message ----- From: Herminio Commons, MD Sent: 08/08/2016   8:28 AM To: Laurine Blazer, LPN  Needs to see vascular surgery. Please make referral.

## 2016-08-08 NOTE — Telephone Encounter (Signed)
Spoke with pt,has apt with [facility] on 08/19/16

## 2016-08-11 ENCOUNTER — Telehealth: Payer: Self-pay | Admitting: Cardiovascular Disease

## 2016-08-11 DIAGNOSIS — I4891 Unspecified atrial fibrillation: Secondary | ICD-10-CM | POA: Diagnosis not present

## 2016-08-11 DIAGNOSIS — Z7901 Long term (current) use of anticoagulants: Secondary | ICD-10-CM | POA: Diagnosis not present

## 2016-08-11 DIAGNOSIS — Z9981 Dependence on supplemental oxygen: Secondary | ICD-10-CM | POA: Diagnosis not present

## 2016-08-11 DIAGNOSIS — L03116 Cellulitis of left lower limb: Secondary | ICD-10-CM | POA: Diagnosis not present

## 2016-08-11 DIAGNOSIS — I5032 Chronic diastolic (congestive) heart failure: Secondary | ICD-10-CM | POA: Diagnosis not present

## 2016-08-11 NOTE — Telephone Encounter (Signed)
Please call Benjamine Mola w/ [facility] @ [phone] has had a 3.5 lb weight gain over night

## 2016-08-11 NOTE — Telephone Encounter (Signed)
Home Health Nurse states that pt was 176 lbs yesterday and was 179.5 lbs now.They noted she started gaining when she was taken off the metolazone 3 times a week. They wonder if they can have a prn order when she has extra weight gain so they don't have to call us each time

## 2016-08-12 MED ORDER — METOLAZONE 2.5 MG PO TABS: ORAL_TABLET | ORAL | 3 refills | Status: DC

## 2016-08-12 NOTE — Telephone Encounter (Signed)
That would be fine. 2.5 mg for weight gain of 3 lbs or more in 24 hrs.

## 2016-08-12 NOTE — Telephone Encounter (Signed)
I left message for Benjamine Mola that the metolazone was escribed to the local pharmacy and left her Dr Court Joy instructions

## 2016-08-13 ENCOUNTER — Ambulatory Visit (INDEPENDENT_AMBULATORY_CARE_PROVIDER_SITE_OTHER): Payer: Medicare Other | Admitting: *Deleted

## 2016-08-13 DIAGNOSIS — I482 Chronic atrial fibrillation, unspecified: Secondary | ICD-10-CM

## 2016-08-13 DIAGNOSIS — Z7901 Long term (current) use of anticoagulants: Secondary | ICD-10-CM | POA: Diagnosis not present

## 2016-08-13 DIAGNOSIS — Z9981 Dependence on supplemental oxygen: Secondary | ICD-10-CM | POA: Diagnosis not present

## 2016-08-13 DIAGNOSIS — I5032 Chronic diastolic (congestive) heart failure: Secondary | ICD-10-CM | POA: Diagnosis not present

## 2016-08-13 DIAGNOSIS — Z5181 Encounter for therapeutic drug level monitoring: Secondary | ICD-10-CM

## 2016-08-13 DIAGNOSIS — L03116 Cellulitis of left lower limb: Secondary | ICD-10-CM | POA: Diagnosis not present

## 2016-08-13 DIAGNOSIS — I4891 Unspecified atrial fibrillation: Secondary | ICD-10-CM | POA: Diagnosis not present

## 2016-08-13 LAB — POCT INR: INR: 2.8

## 2016-08-14 ENCOUNTER — Encounter: Payer: Self-pay | Admitting: Vascular Surgery

## 2016-08-14 DIAGNOSIS — Z79899 Other long term (current) drug therapy: Secondary | ICD-10-CM | POA: Diagnosis not present

## 2016-08-14 LAB — BASIC METABOLIC PANEL
BUN: 25 mg/dL (ref 7–25)
CALCIUM: 9 mg/dL (ref 8.6–10.4)
CO2: 28 mmol/L (ref 20–31)
Chloride: 101 mmol/L (ref 98–110)
Creat: 1.21 mg/dL — ABNORMAL HIGH (ref 0.60–0.93)
Glucose, Bld: 73 mg/dL (ref 65–99)
Potassium: 4.4 mmol/L (ref 3.5–5.3)
Sodium: 139 mmol/L (ref 135–146)

## 2016-08-19 ENCOUNTER — Encounter: Payer: Self-pay | Admitting: Vascular Surgery

## 2016-08-19 ENCOUNTER — Encounter: Payer: Medicare Other | Admitting: Vascular Surgery

## 2016-08-19 ENCOUNTER — Ambulatory Visit (INDEPENDENT_AMBULATORY_CARE_PROVIDER_SITE_OTHER): Payer: Medicare Other | Admitting: Vascular Surgery

## 2016-08-19 VITALS — BP 102/64 | HR 82 | Temp 96.7°F | Resp 18 | Ht 72.0 in | Wt 172.0 lb

## 2016-08-19 DIAGNOSIS — I739 Peripheral vascular disease, unspecified: Secondary | ICD-10-CM

## 2016-08-19 DIAGNOSIS — I87303 Chronic venous hypertension (idiopathic) without complications of bilateral lower extremity: Secondary | ICD-10-CM | POA: Diagnosis not present

## 2016-08-19 MED ORDER — SILVER SULFADIAZINE 1 % EX CREA: 1.0000 "application " | TOPICAL_CREAM | Freq: Every day | CUTANEOUS | 0 refills | Status: DC

## 2016-08-19 NOTE — Progress Notes (Signed)
Vascular and Vein Specialist of [place]  Patient name: Charlotte [name] MRN: [id number] DOB: 07-23-1941 Sex: female  REASON FOR CONSULT: Lower extremity swelling and pain with a history of prior right femoral to above-knee popliteal Gore-Tex graft  HPI: Charlotte [name] is a 76 y.o. female, who is here today for follow-up. She had undergone a right femoral endarterectomy and femoral to above-knee popliteal bypass with Dr. Kellie Simmering in 2014. Her last visit with [place] was in the spring of 2014 at which time she had a normal ankle arm index on the right and the mildly diminished on the left at 0.7. She had been lost to follow-up. She has had recent difficulty with swelling in both lower extremities with ulceration initially over her left lateral malleolus and now on ulceration over her right medial malleolus. She had that required hospitalization for the cellulitis in her left leg and is undergoing local wound care with home health with the compression. She's had near complete healing of her left leg and marked improvement in the swelling. Her main complaint is of swelling in both lower from it is more so on the right than on the left. She does have swelling and stiffness around her right knee. She does have a prior history of right hip fracture requiring surgical repair. She had postop complication of a compartment syndrome and had extensive fasciotomy through the anterior portion of her right thigh. She does not have any arterial rest pain. She does very little walking due to her severe pulmonary dysfunction. She reports that she becomes short of breath with minimal walking and is on oxygen at home. She specifically denies claudication type symptoms  Past Medical History:  Diagnosis Date  . Arthritis   . Atrial fibrillation ([place])   . Atrial fibrillation, chronic ([place])   . CHF (congestive heart failure) ([place])   . Deaf   . Diastolic heart failure ([place])   . DVT (deep  venous thrombosis) ([place])   . Essential hypertension   . Hypothyroidism   . On home O2   . Peripheral neuropathy ([place])   . Peripheral vascular disease ([place])   . 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 Ulcer of ankle ([place])    Bilateral 2015  . Varicose veins     Family History  Problem Relation Age of Onset  . Heart failure Mother   . Heart disease Mother   . Stroke Father   . Heart disease Sister   . Diabetes Son   . Hypertension Sister   . Hypertension Sister   . Stroke Sister     SOCIAL HISTORY: Social History   Social History  . Marital status: Married    Spouse name: N/A  . Number of children: N/A  . Years of education: N/A   Occupational History  . Not on file.   Social History Main Topics  . Smoking status: Never Smoker  . Smokeless tobacco: Never Used  . Alcohol use No  . Drug use: No  . Sexual activity: Not Currently   Other Topics Concern  . Not on file   Social History Narrative  . No narrative on file    No Known Allergies  Current Outpatient Prescriptions  Medication Sig Dispense Refill  . aspirin EC 81 MG tablet Take 81 mg by mouth daily.    Marland Kitchen CARTIA XT 240 MG 24 hr capsule TAKE ONE CAPSULE BY MOUTH ONCE DAILY 90 capsule 3  . Cholecalciferol (VITAMIN D3) 5000 UNITS CAPS Take 1 capsule  by mouth daily.    . clindamycin (CLEOCIN) 300 MG capsule Take 1 capsule (300 mg total) by mouth every 6 (six) hours. 40 capsule 0  . gabapentin (NEURONTIN) 100 MG capsule Take 100 mg by mouth 3 (three) times daily.      . hydroxyurea (HYDREA) 500 MG capsule Take 2 capsules (1,000 mg total) by mouth daily. May take with food to minimize GI side effects. 60 capsule 2  . levothyroxine (SYNTHROID, LEVOTHROID) 75 MCG tablet Take 75 mcg by mouth daily before breakfast.     . metolazone (ZAROXOLYN) 2.5 MG tablet Take 2.5 mg if wt gain is 3 lbs or more in 24 hrs 30 tablet 3  . metoprolol (LOPRESSOR) 100 MG tablet Take 100 mg by mouth 2 (two) times daily.    . Potassium  Chloride ER 20 MEQ TBCR Take 20 mEq by mouth daily. 30 tablet 1  . torsemide (DEMADEX) 20 MG tablet Take 2 tablets (40 mg total) by mouth daily. 180 tablet 3  . warfarin (COUMADIN) 5 MG tablet Take 1 tablet daily except 1/2 tablet on Mondays, Wednesdays and Fridays 90 tablet 4   No current facility-administered medications for this visit.     REVIEW OF SYSTEMS:  [X]  denotes positive finding, [ ]  denotes negative finding Cardiac  Comments:  Chest pain or chest pressure:    Shortness of breath upon exertion: x   Short of breath when lying flat: x   Irregular heart rhythm: x       Vascular    Pain in calf, thigh, or hip brought on by ambulation:    Pain in feet at night that wakes you up from your sleep:  x   Blood clot in your veins:    Leg swelling:  x       Pulmonary    Oxygen at home: x   Productive cough:     Wheezing:         Neurologic    Sudden weakness in arms or legs:     Sudden numbness in arms or legs:     Sudden onset of difficulty speaking or slurred speech:    Temporary loss of vision in one eye:     Problems with dizziness:         Gastrointestinal    Blood in stool:     Vomited blood:         Genitourinary    Burning when urinating:     Blood in urine:        Psychiatric    Major depression:         Hematologic    Bleeding problems:    Problems with blood clotting too easily:        Skin    Rashes or ulcers:        Constitutional    Fever or chills:      PHYSICAL EXAM: Vitals:   08/19/16 0922  BP: 102/64  Pulse: 82  Resp: 18  Temp: (!) 96.7 F (35.9 C)  TempSrc: Oral  SpO2: 90%  Weight: 172 lb (78 kg)  Height: 6' (1.829 m)    GENERAL: The patient is a well-nourished female, in no acute distress. The vital signs are documented above. CARDIOVASCULAR: Carotid arteries without bruits bilaterally. Heart rate irregular. 2+ femoral pulses and absent popliteal and distal pulses bilaterally PULMONARY: There is good air exchange  ABDOMEN:  Soft and non-tender  MUSCULOSKELETAL: There are no major deformities or cyanosis. NEUROLOGIC: No focal  weakness or paresthesias are detected. SKIN: Long incision over her anterior right thigh. Thickening and changes consistent with chronic venous hypertension in both lower extremities. Small ulcer over her left lateral ankle and excoriated open area over her right medial ankle PSYCHIATRIC: The patient has a normal affect.  DATA:  Noninvasive studies from [facility] were reviewed ankle arm index was 0.71 on the right and normal at 1.1 on the left. Duplex imaging revealed superficial femoral artery occlusion and also occlusion of her old Gore-Tex film above-knee popliteal bypass  MEDICAL ISSUES: Had long discussion with the patient and her husband present. She does not have any symptoms associated with her occluded femoropopliteal bypass. It is difficult to know how long this is been occluded since his last documented patency was 2014. She specifically does not walk to the level of claudication and does not have arterial rest pain. She does have severe venous hypertension changes with venous stasis ulceration. I did explain that rarely arterial insufficiency makes venous stasis ulceration more difficult to heal. She has done extremely well with local wound care and compression on her left leg. I explained that it will be important for her to have the same treatment on the right leg. I would recommend Silvadene and compression. She is being seen by home health nurse currently. We dressed both legs today with Silvadene and compression and recommended that she continue this treatment with home health. She was reassured with this discussion will see [place] again on an as-needed basis   Rosetta Posner, MD [facility] Vascular and Vein Specialists of [facility] Tel [phone] Pager [phone]

## 2016-08-22 ENCOUNTER — Ambulatory Visit (INDEPENDENT_AMBULATORY_CARE_PROVIDER_SITE_OTHER): Payer: Medicare Other | Admitting: Cardiology

## 2016-08-22 ENCOUNTER — Telehealth: Payer: Self-pay | Admitting: Cardiovascular Disease

## 2016-08-22 DIAGNOSIS — I482 Chronic atrial fibrillation, unspecified: Secondary | ICD-10-CM

## 2016-08-22 DIAGNOSIS — Z9981 Dependence on supplemental oxygen: Secondary | ICD-10-CM | POA: Diagnosis not present

## 2016-08-22 DIAGNOSIS — Z7901 Long term (current) use of anticoagulants: Secondary | ICD-10-CM | POA: Diagnosis not present

## 2016-08-22 DIAGNOSIS — L03116 Cellulitis of left lower limb: Secondary | ICD-10-CM | POA: Diagnosis not present

## 2016-08-22 DIAGNOSIS — I5032 Chronic diastolic (congestive) heart failure: Secondary | ICD-10-CM | POA: Diagnosis not present

## 2016-08-22 DIAGNOSIS — Z5181 Encounter for therapeutic drug level monitoring: Secondary | ICD-10-CM

## 2016-08-22 DIAGNOSIS — I4891 Unspecified atrial fibrillation: Secondary | ICD-10-CM | POA: Diagnosis not present

## 2016-08-22 LAB — POCT INR: INR: 2.3

## 2016-08-22 NOTE — Telephone Encounter (Signed)
INR 2.3  See coumadin encounter of this date

## 2016-08-22 NOTE — Telephone Encounter (Signed)
Pt 27.0 inr 2.3

## 2016-08-23 DIAGNOSIS — I5032 Chronic diastolic (congestive) heart failure: Secondary | ICD-10-CM | POA: Diagnosis not present

## 2016-08-23 DIAGNOSIS — I110 Hypertensive heart disease with heart failure: Secondary | ICD-10-CM | POA: Diagnosis not present

## 2016-08-23 DIAGNOSIS — I739 Peripheral vascular disease, unspecified: Secondary | ICD-10-CM | POA: Diagnosis not present

## 2016-08-23 DIAGNOSIS — I4891 Unspecified atrial fibrillation: Secondary | ICD-10-CM | POA: Diagnosis not present

## 2016-08-23 DIAGNOSIS — I872 Venous insufficiency (chronic) (peripheral): Secondary | ICD-10-CM | POA: Diagnosis not present

## 2016-08-23 DIAGNOSIS — L97321 Non-pressure chronic ulcer of left ankle limited to breakdown of skin: Secondary | ICD-10-CM | POA: Diagnosis not present

## 2016-08-25 ENCOUNTER — Encounter (HOSPITAL_COMMUNITY): Payer: Medicare Other | Attending: Adult Health | Admitting: Adult Health

## 2016-08-25 ENCOUNTER — Encounter (HOSPITAL_COMMUNITY): Payer: Self-pay | Admitting: Adult Health

## 2016-08-25 ENCOUNTER — Encounter (HOSPITAL_COMMUNITY): Payer: Medicare Other

## 2016-08-25 VITALS — BP 95/46 | HR 77 | Temp 97.5°F | Resp 18 | Wt 165.7 lb

## 2016-08-25 DIAGNOSIS — D45 Polycythemia vera: Secondary | ICD-10-CM

## 2016-08-25 LAB — CBC
HCT: 36.5 % (ref 36.0–46.0)
Hemoglobin: 10.5 g/dL — ABNORMAL LOW (ref 12.0–15.0)
MCH: 22.8 pg — AB (ref 26.0–34.0)
MCHC: 28.8 g/dL — AB (ref 30.0–36.0)
MCV: 79.3 fL (ref 78.0–100.0)
PLATELETS: 616 10*3/uL — AB (ref 150–400)
RBC: 4.6 MIL/uL (ref 3.87–5.11)
RDW: 21.9 % — AB (ref 11.5–15.5)
WBC: 24 10*3/uL — ABNORMAL HIGH (ref 4.0–10.5)

## 2016-08-25 NOTE — Progress Notes (Signed)
[place], [place] [address]    CLINIC:  Medical Oncology/Hematology  PRIMARY CARE PROVIDER:  [name], MD [place] [address]  REASON FOR FOLLOW-UP:  Polycythemia vera   CURRENT THERAPY: Hydrea 1000 mg daily  INTERVAL HISTORY: Charlotte [name] 76 y.o. 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    She is seen today with her husband. She is wearing oxygen; her O2 sats on RA were 77%; 92% on 2 L.  She has been off and on the O2 at home for the past year or so.    She has bilateral lower extremity cellulitis and swelling.  She tells me, "I've lost about 10 lbs since December in fluid. They've still got me on the fluid pills now."  (our records indicate she has lost about 15 lbs since mid-December).  Home Health comes out to her home once weekly to help manage cellulitis.    Her appetite and energy levels are good.  She denies any bleeding.  She continues to tolerate the Hydrea well, largely with no complaints of the medication.     REVIEW OF SYSTEMS:  Review of Systems  Constitutional: Negative for chills, fever, malaise/fatigue and weight loss.  HENT: Negative.   Eyes: Negative.   Respiratory: Positive for cough (dry cough for about ~1 year; chronic and unchanged) and shortness of breath (at baseline). Negative for hemoptysis and sputum production.   Cardiovascular: Positive for leg swelling. Negative for chest pain.  Gastrointestinal: Negative.  Negative for blood in stool and melena.  Genitourinary: Negative.  Negative for dysuria and hematuria.  Musculoskeletal: Negative.   Skin: Negative.  Negative for rash.  Neurological: Negative.   Endo/Heme/Allergies: Negative.   Psychiatric/Behavioral: Negative.     PAST MEDICAL/SURGICAL HISTORY:  Past Medical History:  Diagnosis Date  . Arthritis   . Atrial fibrillation  ([place])   . Atrial fibrillation, chronic ([place])   . CHF (congestive heart failure) ([place])   . Deaf   . Diastolic heart failure ([place])   . DVT (deep venous thrombosis) ([place])   . Essential hypertension   . Hypothyroidism   . On home O2   . Peripheral neuropathy ([place])   . Peripheral vascular disease ([place])   . 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 Ulcer of ankle ([place])    Bilateral 2015  . Varicose veins     Past Surgical History:  Procedure Laterality Date  . ABDOMINAL AORTAGRAM N/A 09/14/2012   Procedure: ABDOMINAL [name];  Surgeon: Serafina Mitchell, MD;  [facility] C[facility] LAB;  Service: Cardiovascular;  Laterality: N/A;  . ABDOMINAL HYSTERECTOMY    . CORONARY ANGIOPLASTY    . DRESSING CHANGE UNDER ANESTHESIA  12/02/2011   Procedure: DRESSING CHANGE UNDER ANESTHESIA;  Surgeon: Carole Civil, MD;  Location: [facility] OR[facility];  Service: Orthopedics;  Laterality: Right;  . ENDARTERECTOMY FEMORAL Right 09/15/2012   Procedure: ENDARTERECTOMY FEMORAL;  Surgeon: Mal Misty, MD;  Location: [place] [facility];  Service: Vascular;  Laterality: Right;  . FASCIOTOMY  11/29/2011   Procedure: FASCIOTOMY;  Surgeon: Carole Civil, MD;  Location: [facility];  Service: Orthopedics;  Laterality: Right;  right thigh   . FEMORAL-POPLITEAL BYPASS GRAFT Right 09/15/2012   Procedure: BYPASS GRAFT FEMORAL-POPLITEAL ARTERY;  Surgeon: Mal Misty, MD;  Location: [facility];  Service: Vascular;  Laterality: Right;  . HIP PINNING,CANNULATED  11/19/2011   Procedure: CANNULATED HIP PINNING;  Surgeon: Sanjuana Kava, MD;  Location: [facility];  Service: Orthopedics;  Laterality: Right;  . PATCH ANGIOPLASTY Right 09/15/2012   Procedure: PATCH ANGIOPLASTY;  Surgeon: Mal Misty, MD;  Location: [facility];  Service: Vascular;  Laterality: Right;  . TUBAL LIGATION      FAMILY/SOCIAL HISTORY:  Family History  Problem Relation Age of Onset  . Heart failure Mother   . Heart disease Mother   . Stroke Father   . Heart disease Sister   . Diabetes  Son   . Hypertension Sister   . Hypertension Sister   . Stroke Sister     Social History   Social History  . Marital status: Married    Spouse name: N/A  . Number of children: N/A  . Years of education: N/A   Social History Main Topics  . Smoking status: Never Smoker  . Smokeless tobacco: Never Used  . Alcohol use No  . Drug use: No  . Sexual activity: Not Currently   Other Topics Concern  . Not on file   Social History Narrative  . No narrative on file     CURRENT MEDICATIONS:  Outpatient Encounter Prescriptions as of 08/25/2016  Medication Sig Note  . aspirin EC 81 MG tablet Take 81 mg by mouth daily.   Marland Kitchen CARTIA XT 240 MG 24 hr capsule TAKE ONE CAPSULE BY MOUTH ONCE DAILY   . Cholecalciferol (VITAMIN D3) 5000 UNITS CAPS Take 1 capsule by mouth daily.   . clindamycin (CLEOCIN) 300 MG capsule Take 1 capsule (300 mg total) by mouth every 6 (six) hours.   . gabapentin (NEURONTIN) 100 MG capsule Take 100 mg by mouth 3 (three) times daily.     . hydroxyurea (HYDREA) 500 MG capsule Take 2 capsules (1,000 mg total) by mouth daily. May take with food to minimize GI side effects.   Marland Kitchen levothyroxine (SYNTHROID, LEVOTHROID) 75 MCG tablet Take 75 mcg by mouth daily before breakfast.  11/09/2014: Received from: External Pharmacy  . metolazone (ZAROXOLYN) 2.5 MG tablet Take 2.5 mg if wt gain is 3 lbs or more in 24 hrs   . metoprolol (LOPRESSOR) 100 MG tablet Take 100 mg by mouth 2 (two) times daily. 08/07/2015: Received from: External Pharmacy Received Sig:   . Potassium Chloride ER 20 MEQ TBCR Take 20 mEq by mouth daily.   . silver sulfADIAZINE (SILVADENE) 1 % cream Apply 1 application topically daily.   Marland Kitchen torsemide (DEMADEX) 20 MG tablet Take 2 tablets (40 mg total) by mouth daily.   Marland Kitchen warfarin (COUMADIN) 5 MG tablet Take 1 tablet daily except 1/2 tablet on Mondays, Wednesdays and Fridays    No facility-administered encounter medications on file as of 08/25/2016.     ALLERGIES:  No  Known Allergies  PHYSICAL EXAMINATION  ECOG PERFORMANCE STATUS: 1-2   Vitals:   08/25/16 1506  BP: (!) 95/46  Pulse: 77  Resp: 18  Temp: 97.5 F (36.4 C)    Filed Weights   08/25/16 1506  Weight: 165 lb 11.2 oz (75.2 kg)    General: Female in no acute distress wearing oxygen.  Accompanied by her husband.   HEENT: Head is normocephalic.  Pupils equal and reactive to light. Conjunctivae clear without exudate.  Sclerae anicteric. Oral mucosa is pink and moist without lesions. Oropharynx is pink and moist without lesions. Lymph: No cervical, supraclavicular, infraclavicular lymphadenopathy noted on palpation.   Cardiovascular: Irregular rhythm, normal rate.  Respiratory: Diminished breath sounds throughout, but clear to auscultation  bilaterally.  Breathing non-labored.    GU: Deferred.   GI: Soft, non-tender abdomen. Normoactive bowel sounds. No hepatosplenomegaly.  Neuro: No focal deficits. Steady gait.   Psych: Normal mood and affect for situation. Extremities: 2+ BLE edema; legs wrapped.      LABORATORY DATA: CBC    Component Value Date/Time   WBC 24.0 (H) 08/25/2016 1336   RBC 4.60 08/25/2016 1336   HGB 10.5 (L) 08/25/2016 1336   HCT 36.5 08/25/2016 1336   PLT 616 (H) 08/25/2016 1336   MCV 79.3 08/25/2016 1336   MCH 22.8 (L) 08/25/2016 1336   MCHC 28.8 (L) 08/25/2016 1336   RDW 21.9 (H) 08/25/2016 1336   LYMPHSABS 1.1 07/04/2016 0503   MONOABS 0.6 07/04/2016 0503   EOSABS 0.4 07/04/2016 0503   BASOSABS 0.4 (H) 07/04/2016 0503       Chemistry      Component Value Date/Time   NA 139 08/14/2016 0733   K 4.4 08/14/2016 0733   CL 101 08/14/2016 0733   CO2 28 08/14/2016 0733   BUN 25 08/14/2016 0733   CREATININE 1.21 (H) 08/14/2016 0733      Component Value Date/Time   CALCIUM 9.0 08/14/2016 0733   ALKPHOS 80 07/04/2016 0503   AST 17 07/04/2016 0503   ALT 10 (L) 07/04/2016 0503   BILITOT 2.4 (H) 07/04/2016 0503        PENDING LABS:   RADIOGRAPHIC  STUDIES:  No results found.   PATHOLOGY:    ASSESSMENT AND PLAN:  Ms. Eggleton is pleasant 76 y.o. female with JAK2+ polycythemia vera. Also h/o chronic A-fib on anticoagulation with coumadin.   Polycythemia vera:  -Blood counts are largely stable from previous. Hemoglobin stable at 10.5; platelets elevated at 616. Given her cardiac and respiratory comorbidities, maintain current dose of Hydrea at 1000 mg daily. She is not a candidate for Anagrelide d/t cardiac history.  She will return in 4 months for continued follow-up.   Bilateral lower extremity cellulitis:  -She is on Clindamycin. Maintain current Home Health schedule with once weekly visits for continued management.    Chronic hypoxia:  -Encouraged her to maintain O2 via nasal cannula to keep O2 sats above 90%.  Her hemoglobin is stable at 10.5.    ORDERS PLACED FOR THIS ENCOUNTER: No orders of the defined types were placed in this encounter.   MEDICATIONS PRESCRIBED THIS ENCOUNTER: No orders of the defined types were placed in this encounter.   THERAPY PLAN:  Continue with Hydrea as planned.   All questions were answered. The patient knows to call the clinic with any problems, questions or concerns. We can certainly see the patient much sooner if necessary.  Patient and plan discussed with Dr. Ancil Linsey and she is in agreement with the aforementioned.   Mike Craze, NP [place] [phone]

## 2016-08-25 NOTE — Patient Instructions (Signed)
[place] at [facility] Discharge Instructions  RECOMMENDATIONS MADE BY THE CONSULTANT AND ANY TEST RESULTS WILL BE SENT TO YOUR REFERRING PHYSICIAN.  Exam with Mike Craze, NP today. We will see you in 4 months for follow up with labs.    Thank you for choosing [place] at [facility] to provide your oncology and hematology care.  To afford each patient quality time with our provider, please arrive at least 15 minutes before your scheduled appointment time.    If you have a lab appointment with the [facility] please come in thru the  Main Entrance and check in at the main information desk  You need to re-schedule your appointment should you arrive 10 or more minutes late.  We strive to give you quality time with our providers, and arriving late affects you and other patients whose appointments are after yours.  Also, if you no show three or more times for appointments you may be dismissed from the clinic at the providers discretion.     Again, thank you for choosing [facility].  Our hope is that these requests will decrease the amount of time that you wait before being seen by our physicians.       _____________________________________________________________  Should you have questions after your visit to [facility], please contact our office at [phone] between the hours of 8:30 a.m. and 4:30 p.m.  Voicemails left after 4:30 p.m. will not be returned until the following business day.  For prescription refill requests, have your pharmacy contact our office.       Resources For Cancer Patients and their Caregivers ? American Cancer Society: Can assist with transportation, wigs, general needs, runs Look Good Feel Better.        614-477-8928 ? Cancer Care: Provides financial assistance, online support groups, medication/co-pay assistance.  1-800-813-HOPE 303-301-2566) ? [facility] Assists [place] cancer patients and their families through emotional , educational and financial support.  (575) 125-3322 ? [place] DSS Where to apply for food stamps, Medicaid and utility assistance. (231)594-3271 ? [facility]: Transportation to medical appointments. (832)840-3233 ? Social Security Administration: May apply for disability if have a Stage IV cancer. 9170059625 2027352715 ? [employer], Disability and Transit Services: Assists with nutrition, care and transit needs. [place] Support Programs: @10RELATIVEDAYS @ > Cancer Support Group  2nd Tuesday of the month 1pm-2pm, Journey Room  > Creative Journey  3rd Tuesday of the month 1130am-1pm, Journey Room  > Look Good Feel Better  1st Wednesday of the month 10am-12 noon, Journey Room (Call [facility] to register [phone])

## 2016-08-29 DIAGNOSIS — I110 Hypertensive heart disease with heart failure: Secondary | ICD-10-CM | POA: Diagnosis not present

## 2016-08-29 DIAGNOSIS — I4891 Unspecified atrial fibrillation: Secondary | ICD-10-CM | POA: Diagnosis not present

## 2016-08-29 DIAGNOSIS — I872 Venous insufficiency (chronic) (peripheral): Secondary | ICD-10-CM | POA: Diagnosis not present

## 2016-08-29 DIAGNOSIS — I5032 Chronic diastolic (congestive) heart failure: Secondary | ICD-10-CM | POA: Diagnosis not present

## 2016-08-29 DIAGNOSIS — L97321 Non-pressure chronic ulcer of left ankle limited to breakdown of skin: Secondary | ICD-10-CM | POA: Diagnosis not present

## 2016-08-29 DIAGNOSIS — I739 Peripheral vascular disease, unspecified: Secondary | ICD-10-CM | POA: Diagnosis not present

## 2016-09-03 ENCOUNTER — Other Ambulatory Visit (HOSPITAL_COMMUNITY): Payer: Self-pay | Admitting: Oncology

## 2016-09-03 ENCOUNTER — Telehealth: Payer: Self-pay

## 2016-09-03 ENCOUNTER — Ambulatory Visit (INDEPENDENT_AMBULATORY_CARE_PROVIDER_SITE_OTHER): Payer: Medicare Other | Admitting: *Deleted

## 2016-09-03 DIAGNOSIS — I739 Peripheral vascular disease, unspecified: Secondary | ICD-10-CM | POA: Diagnosis not present

## 2016-09-03 DIAGNOSIS — I4891 Unspecified atrial fibrillation: Secondary | ICD-10-CM | POA: Diagnosis not present

## 2016-09-03 DIAGNOSIS — I110 Hypertensive heart disease with heart failure: Secondary | ICD-10-CM | POA: Diagnosis not present

## 2016-09-03 DIAGNOSIS — L97321 Non-pressure chronic ulcer of left ankle limited to breakdown of skin: Secondary | ICD-10-CM | POA: Diagnosis not present

## 2016-09-03 DIAGNOSIS — I872 Venous insufficiency (chronic) (peripheral): Secondary | ICD-10-CM | POA: Diagnosis not present

## 2016-09-03 DIAGNOSIS — I5032 Chronic diastolic (congestive) heart failure: Secondary | ICD-10-CM | POA: Diagnosis not present

## 2016-09-03 DIAGNOSIS — I482 Chronic atrial fibrillation, unspecified: Secondary | ICD-10-CM

## 2016-09-03 DIAGNOSIS — D45 Polycythemia vera: Secondary | ICD-10-CM

## 2016-09-03 DIAGNOSIS — Z5181 Encounter for therapeutic drug level monitoring: Secondary | ICD-10-CM

## 2016-09-03 LAB — POCT INR: INR: 2.8

## 2016-09-03 NOTE — Telephone Encounter (Signed)
Charlotte [name] from [facility] called to report that patient is refusing treatment including LE dressing change and compression wraps. Advised that Dr. Donnetta Hutching did state in dictation at last OV 08/19/16 that he would see her on an as needed basis. She states she will report to PCP and let them decided how to handle this.

## 2016-09-10 DIAGNOSIS — I110 Hypertensive heart disease with heart failure: Secondary | ICD-10-CM | POA: Diagnosis not present

## 2016-09-10 DIAGNOSIS — I4891 Unspecified atrial fibrillation: Secondary | ICD-10-CM | POA: Diagnosis not present

## 2016-09-10 DIAGNOSIS — I739 Peripheral vascular disease, unspecified: Secondary | ICD-10-CM | POA: Diagnosis not present

## 2016-09-10 DIAGNOSIS — I872 Venous insufficiency (chronic) (peripheral): Secondary | ICD-10-CM | POA: Diagnosis not present

## 2016-09-10 DIAGNOSIS — I5032 Chronic diastolic (congestive) heart failure: Secondary | ICD-10-CM | POA: Diagnosis not present

## 2016-09-10 DIAGNOSIS — L97321 Non-pressure chronic ulcer of left ankle limited to breakdown of skin: Secondary | ICD-10-CM | POA: Diagnosis not present

## 2016-09-17 ENCOUNTER — Ambulatory Visit (INDEPENDENT_AMBULATORY_CARE_PROVIDER_SITE_OTHER): Payer: Medicare Other | Admitting: *Deleted

## 2016-09-17 DIAGNOSIS — I5032 Chronic diastolic (congestive) heart failure: Secondary | ICD-10-CM | POA: Diagnosis not present

## 2016-09-17 DIAGNOSIS — I482 Chronic atrial fibrillation, unspecified: Secondary | ICD-10-CM

## 2016-09-17 DIAGNOSIS — I110 Hypertensive heart disease with heart failure: Secondary | ICD-10-CM | POA: Diagnosis not present

## 2016-09-17 DIAGNOSIS — I872 Venous insufficiency (chronic) (peripheral): Secondary | ICD-10-CM | POA: Diagnosis not present

## 2016-09-17 DIAGNOSIS — L97321 Non-pressure chronic ulcer of left ankle limited to breakdown of skin: Secondary | ICD-10-CM | POA: Diagnosis not present

## 2016-09-17 DIAGNOSIS — Z5181 Encounter for therapeutic drug level monitoring: Secondary | ICD-10-CM

## 2016-09-17 DIAGNOSIS — I4891 Unspecified atrial fibrillation: Secondary | ICD-10-CM | POA: Diagnosis not present

## 2016-09-17 DIAGNOSIS — I739 Peripheral vascular disease, unspecified: Secondary | ICD-10-CM | POA: Diagnosis not present

## 2016-09-17 LAB — POCT INR: INR: 2.6

## 2016-09-22 ENCOUNTER — Ambulatory Visit (INDEPENDENT_AMBULATORY_CARE_PROVIDER_SITE_OTHER): Payer: Medicare Other | Admitting: Cardiovascular Disease

## 2016-09-22 ENCOUNTER — Encounter: Payer: Self-pay | Admitting: Cardiovascular Disease

## 2016-09-22 VITALS — BP 90/70 | HR 99 | Ht 73.0 in | Wt 161.0 lb

## 2016-09-22 DIAGNOSIS — I5032 Chronic diastolic (congestive) heart failure: Secondary | ICD-10-CM | POA: Diagnosis not present

## 2016-09-22 DIAGNOSIS — I10 Essential (primary) hypertension: Secondary | ICD-10-CM | POA: Diagnosis not present

## 2016-09-22 DIAGNOSIS — I482 Chronic atrial fibrillation, unspecified: Secondary | ICD-10-CM

## 2016-09-22 DIAGNOSIS — I739 Peripheral vascular disease, unspecified: Secondary | ICD-10-CM

## 2016-09-22 NOTE — Patient Instructions (Signed)
Your physician recommends that you schedule a follow-up appointment in: 4 months with Dr Bronson Ing     Your physician recommends that you continue on your current medications as directed. Please refer to the Current Medication list given to you today.    Thank you for choosing [facility] !

## 2016-09-22 NOTE — Progress Notes (Signed)
SUBJECTIVE: The patient presents for follow-up of chronic atrial fibrillation, acute on chronic diastolic heart failure, polycythemia vera, PVD, and HTN.  Has an occluded femoropopliteal bypass and follows with vascular surgery. Also has severe venous hypertension changes with venous stasis ulceration.   Echocardiogram 11/30/15 showed normal left ventricular systolic function, LVEF 42-59%, mild LVH, mild mitral and tricuspid regurgitation, severe biatrial dilatation, moderately elevated pulmonary pressures 48 mmHg.  I added metolazone at her last visit on 07/17/16. She now takes it for rapid weight gain and has only had to do so twice since I last saw her.  Wt today 161 lbs (180 lbs 07/17/16)  She is feeling much better and denies chest pain and leg swelling.   Review of Systems: As per "subjective", otherwise negative.  No Known Allergies  Current Outpatient Prescriptions  Medication Sig Dispense Refill  . aspirin EC 81 MG tablet Take 81 mg by mouth daily.    Marland Kitchen CARTIA XT 240 MG 24 hr capsule TAKE ONE CAPSULE BY MOUTH ONCE DAILY 90 capsule 3  . Cholecalciferol (VITAMIN D3) 5000 UNITS CAPS Take 1 capsule by mouth daily.    Marland Kitchen gabapentin (NEURONTIN) 100 MG capsule Take 100 mg by mouth 3 (three) times daily.      . hydroxyurea (HYDREA) 500 MG capsule TAKE TWO CAPSULES BY MOUTH ONCE DAILY.  MAY TAKE WITH FOOD TO MINIMIZE GI SIDE EFFECTS 60 capsule 2  . levothyroxine (SYNTHROID, LEVOTHROID) 75 MCG tablet Take 75 mcg by mouth daily before breakfast.     . metolazone (ZAROXOLYN) 2.5 MG tablet Take 2.5 mg if wt gain is 3 lbs or more in 24 hrs 30 tablet 3  . metoprolol (LOPRESSOR) 100 MG tablet Take 100 mg by mouth 2 (two) times daily.    . Potassium Chloride ER 20 MEQ TBCR Take 20 mEq by mouth daily. 30 tablet 1  . silver sulfADIAZINE (SILVADENE) 1 % cream Apply 1 application topically daily. 50 g 0  . torsemide (DEMADEX) 20 MG tablet Take 2 tablets (40 mg total) by mouth daily. 180  tablet 3  . warfarin (COUMADIN) 5 MG tablet Take 1 tablet daily except 1/2 tablet on Mondays, Wednesdays and Fridays 90 tablet 4   No current facility-administered medications for this visit.     Past Medical History:  Diagnosis Date  . Arthritis   . Atrial fibrillation ([place])   . Atrial fibrillation, chronic ([place])   . CHF (congestive heart failure) ([place])   . Deaf   . Diastolic heart failure ([place])   . DVT (deep venous thrombosis) ([place])   . Essential hypertension   . Hypothyroidism   . On home O2   . Peripheral neuropathy ([place])   . Peripheral vascular disease ([place])   . 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 Ulcer of ankle ([place])    Bilateral 2015  . Varicose veins     Past Surgical History:  Procedure Laterality Date  . ABDOMINAL AORTAGRAM N/A 09/14/2012   Procedure: ABDOMINAL [name];  Surgeon: Serafina Mitchell, MD;  Location: [facility] CATH LAB;  Service: Cardiovascular;  Laterality: N/A;  . ABDOMINAL HYSTERECTOMY    . CORONARY ANGIOPLASTY    . DRESSING CHANGE UNDER ANESTHESIA  12/02/2011   Procedure: DRESSING CHANGE UNDER ANESTHESIA;  Surgeon: Carole Civil, MD;  Location: [facility];  Service: Orthopedics;  Laterality: Right;  . ENDARTERECTOMY FEMORAL Right 09/15/2012   Procedure: ENDARTERECTOMY FEMORAL;  Surgeon: Mal Misty, MD;  Location: [place];  Service: Vascular;  Laterality: Right;  . FASCIOTOMY  11/29/2011   Procedure: FASCIOTOMY;  Surgeon: Carole Civil, MD;  Location: [facility];  Service: Orthopedics;  Laterality: Right;  right thigh   . FEMORAL-POPLITEAL BYPASS GRAFT Right 09/15/2012   Procedure: BYPASS GRAFT FEMORAL-POPLITEAL ARTERY;  Surgeon: Mal Misty, MD;  Location: [place];  Service: Vascular;  Laterality: Right;  . HIP PINNING,CANNULATED  11/19/2011   Procedure: CANNULATED HIP PINNING;  Surgeon: Sanjuana Kava, MD;  Location: [facility];  Service: Orthopedics;  Laterality: Right;  . PATCH ANGIOPLASTY Right 09/15/2012   Procedure: PATCH ANGIOPLASTY;  Surgeon: Mal Misty, MD;  Location: [place];  Service: Vascular;  Laterality: Right;  . TUBAL LIGATION      Social History   Social History  . Marital status: Married    Spouse name: N/A  . Number of children: N/A  . Years of education: N/A   Occupational History  . Not on file.   Social History Main Topics  . Smoking status: Never Smoker  . Smokeless tobacco: Never Used  . Alcohol use No  . Drug use: No  . Sexual activity: Not Currently   Other Topics Concern  . Not on file   Social History Narrative  . No narrative on file     Vitals:   09/22/16 1303  BP: 90/70  Pulse: 99  SpO2: (!) 86%  Weight: 161 lb (73 kg)  Height: 6\' 1"  (1.854 m)    PHYSICAL EXAM General: NAD, hard of hearing, using O2 by nasal cannula Neck: No JVD, no thyromegaly. Lungs: Diminished but clear to auscultation bilaterally with normal respiratory effort. CV: Nondisplaced PMI. Regular rate,irregular rhythm, normal S1/S2, no S3, no murmur. No pretibial edema. Bilateral venous varicosities.  Abdomen: Soft, nontender, no distention.  Neurologic: Alert and oriented x 3.  Psych: Normal affect. Skin: Normal.   ECG: Most recent ECG reviewed.      ASSESSMENT AND PLAN: 1. Chronic atrial fibrillation: Symptomatically stable. Rate is controlled on current doses of metoprolol and diltiazem. Will continue warfarin.   2. Essential HTN: Low normal but asymptomatic. No changes.  3. PVD: Follows with vascular surgery. On ASA.  4. Chronic diastolic heart failure: Euvolemic on torsemide 40 mg daily. Continue metolazone 2.5 mg for weight gain of 3 or more lbs within 24 hrs.  Dispo: f/u 4 months   Kate Sable, M.D., F.A.C.C.

## 2016-09-25 DIAGNOSIS — I4891 Unspecified atrial fibrillation: Secondary | ICD-10-CM | POA: Diagnosis not present

## 2016-09-25 DIAGNOSIS — I110 Hypertensive heart disease with heart failure: Secondary | ICD-10-CM | POA: Diagnosis not present

## 2016-09-25 DIAGNOSIS — I739 Peripheral vascular disease, unspecified: Secondary | ICD-10-CM | POA: Diagnosis not present

## 2016-09-25 DIAGNOSIS — I872 Venous insufficiency (chronic) (peripheral): Secondary | ICD-10-CM | POA: Diagnosis not present

## 2016-09-25 DIAGNOSIS — I5032 Chronic diastolic (congestive) heart failure: Secondary | ICD-10-CM | POA: Diagnosis not present

## 2016-09-25 DIAGNOSIS — L97321 Non-pressure chronic ulcer of left ankle limited to breakdown of skin: Secondary | ICD-10-CM | POA: Diagnosis not present

## 2016-10-15 ENCOUNTER — Ambulatory Visit (INDEPENDENT_AMBULATORY_CARE_PROVIDER_SITE_OTHER): Payer: Medicare Other | Admitting: *Deleted

## 2016-10-15 DIAGNOSIS — I482 Chronic atrial fibrillation, unspecified: Secondary | ICD-10-CM

## 2016-10-15 DIAGNOSIS — Z5181 Encounter for therapeutic drug level monitoring: Secondary | ICD-10-CM

## 2016-10-15 LAB — POCT INR: INR: 3.9

## 2016-11-03 ENCOUNTER — Ambulatory Visit (INDEPENDENT_AMBULATORY_CARE_PROVIDER_SITE_OTHER): Payer: Medicare Other | Admitting: *Deleted

## 2016-11-03 DIAGNOSIS — Z5181 Encounter for therapeutic drug level monitoring: Secondary | ICD-10-CM

## 2016-11-03 DIAGNOSIS — I482 Chronic atrial fibrillation, unspecified: Secondary | ICD-10-CM

## 2016-11-03 LAB — POCT INR: INR: 2.4

## 2016-11-18 DIAGNOSIS — Z6821 Body mass index (BMI) 21.0-21.9, adult: Secondary | ICD-10-CM | POA: Diagnosis not present

## 2016-11-18 DIAGNOSIS — E039 Hypothyroidism, unspecified: Secondary | ICD-10-CM | POA: Diagnosis not present

## 2016-11-18 DIAGNOSIS — Z1389 Encounter for screening for other disorder: Secondary | ICD-10-CM | POA: Diagnosis not present

## 2016-11-18 DIAGNOSIS — K120 Recurrent oral aphthae: Secondary | ICD-10-CM | POA: Diagnosis not present

## 2016-12-01 ENCOUNTER — Ambulatory Visit (INDEPENDENT_AMBULATORY_CARE_PROVIDER_SITE_OTHER): Payer: Medicare Other | Admitting: *Deleted

## 2016-12-01 DIAGNOSIS — Z5181 Encounter for therapeutic drug level monitoring: Secondary | ICD-10-CM | POA: Diagnosis not present

## 2016-12-01 DIAGNOSIS — I482 Chronic atrial fibrillation, unspecified: Secondary | ICD-10-CM

## 2016-12-01 LAB — POCT INR: INR: 2.5

## 2016-12-16 ENCOUNTER — Other Ambulatory Visit: Payer: Self-pay | Admitting: Adult Health

## 2016-12-22 ENCOUNTER — Ambulatory Visit (HOSPITAL_COMMUNITY): Payer: Medicare Other

## 2016-12-22 ENCOUNTER — Encounter (HOSPITAL_COMMUNITY): Payer: Medicare Other | Attending: Oncology

## 2016-12-22 DIAGNOSIS — D45 Polycythemia vera: Secondary | ICD-10-CM | POA: Diagnosis not present

## 2016-12-22 LAB — CBC
HEMATOCRIT: 34.4 % — AB (ref 36.0–46.0)
Hemoglobin: 9.5 g/dL — ABNORMAL LOW (ref 12.0–15.0)
MCH: 20.3 pg — ABNORMAL LOW (ref 26.0–34.0)
MCHC: 27.6 g/dL — ABNORMAL LOW (ref 30.0–36.0)
MCV: 73.7 fL — ABNORMAL LOW (ref 78.0–100.0)
PLATELETS: 625 10*3/uL — AB (ref 150–400)
RBC: 4.67 MIL/uL (ref 3.87–5.11)
RDW: 22.7 % — AB (ref 11.5–15.5)
WBC: 23.9 10*3/uL — ABNORMAL HIGH (ref 4.0–10.5)

## 2017-01-05 ENCOUNTER — Ambulatory Visit (INDEPENDENT_AMBULATORY_CARE_PROVIDER_SITE_OTHER): Payer: Medicare Other | Admitting: *Deleted

## 2017-01-05 DIAGNOSIS — I482 Chronic atrial fibrillation, unspecified: Secondary | ICD-10-CM

## 2017-01-05 DIAGNOSIS — Z5181 Encounter for therapeutic drug level monitoring: Secondary | ICD-10-CM | POA: Diagnosis not present

## 2017-01-05 LAB — POCT INR: INR: 2.6

## 2017-01-08 DIAGNOSIS — H3530 Unspecified macular degeneration: Secondary | ICD-10-CM | POA: Diagnosis not present

## 2017-01-15 ENCOUNTER — Ambulatory Visit (HOSPITAL_COMMUNITY): Payer: Medicare Other

## 2017-01-15 ENCOUNTER — Other Ambulatory Visit (HOSPITAL_COMMUNITY): Payer: Medicare Other

## 2017-01-16 ENCOUNTER — Ambulatory Visit: Payer: Medicare Other | Admitting: Cardiovascular Disease

## 2017-01-16 DIAGNOSIS — I4891 Unspecified atrial fibrillation: Secondary | ICD-10-CM | POA: Diagnosis not present

## 2017-01-16 DIAGNOSIS — D638 Anemia in other chronic diseases classified elsewhere: Secondary | ICD-10-CM | POA: Diagnosis not present

## 2017-01-16 DIAGNOSIS — D45 Polycythemia vera: Secondary | ICD-10-CM | POA: Diagnosis not present

## 2017-01-16 DIAGNOSIS — Z6820 Body mass index (BMI) 20.0-20.9, adult: Secondary | ICD-10-CM | POA: Diagnosis not present

## 2017-01-16 DIAGNOSIS — I10 Essential (primary) hypertension: Secondary | ICD-10-CM | POA: Diagnosis not present

## 2017-01-16 DIAGNOSIS — H2640 Unspecified secondary cataract: Secondary | ICD-10-CM | POA: Diagnosis not present

## 2017-01-16 DIAGNOSIS — E039 Hypothyroidism, unspecified: Secondary | ICD-10-CM | POA: Diagnosis not present

## 2017-01-16 DIAGNOSIS — I5030 Unspecified diastolic (congestive) heart failure: Secondary | ICD-10-CM | POA: Diagnosis not present

## 2017-01-20 ENCOUNTER — Encounter (INDEPENDENT_AMBULATORY_CARE_PROVIDER_SITE_OTHER): Payer: Medicare Other | Admitting: Ophthalmology

## 2017-01-20 DIAGNOSIS — H43813 Vitreous degeneration, bilateral: Secondary | ICD-10-CM

## 2017-01-20 DIAGNOSIS — I10 Essential (primary) hypertension: Secondary | ICD-10-CM

## 2017-01-20 DIAGNOSIS — H35033 Hypertensive retinopathy, bilateral: Secondary | ICD-10-CM | POA: Diagnosis not present

## 2017-01-20 DIAGNOSIS — H353132 Nonexudative age-related macular degeneration, bilateral, intermediate dry stage: Secondary | ICD-10-CM | POA: Diagnosis not present

## 2017-01-27 ENCOUNTER — Other Ambulatory Visit (HOSPITAL_COMMUNITY): Payer: Medicare Other

## 2017-01-27 ENCOUNTER — Ambulatory Visit (HOSPITAL_COMMUNITY): Payer: Medicare Other

## 2017-01-28 DIAGNOSIS — E039 Hypothyroidism, unspecified: Secondary | ICD-10-CM | POA: Diagnosis not present

## 2017-01-28 DIAGNOSIS — D45 Polycythemia vera: Secondary | ICD-10-CM | POA: Diagnosis not present

## 2017-01-28 DIAGNOSIS — I6523 Occlusion and stenosis of bilateral carotid arteries: Secondary | ICD-10-CM | POA: Diagnosis not present

## 2017-01-28 DIAGNOSIS — H2640 Unspecified secondary cataract: Secondary | ICD-10-CM | POA: Diagnosis not present

## 2017-01-28 DIAGNOSIS — I10 Essential (primary) hypertension: Secondary | ICD-10-CM | POA: Diagnosis not present

## 2017-01-28 DIAGNOSIS — I4891 Unspecified atrial fibrillation: Secondary | ICD-10-CM | POA: Diagnosis not present

## 2017-02-02 DIAGNOSIS — R944 Abnormal results of kidney function studies: Secondary | ICD-10-CM | POA: Diagnosis not present

## 2017-02-03 ENCOUNTER — Other Ambulatory Visit (HOSPITAL_COMMUNITY): Payer: Self-pay | Admitting: *Deleted

## 2017-02-03 DIAGNOSIS — D649 Anemia, unspecified: Secondary | ICD-10-CM

## 2017-02-05 ENCOUNTER — Encounter (HOSPITAL_COMMUNITY): Payer: Medicare Other | Attending: Oncology | Admitting: Oncology

## 2017-02-05 ENCOUNTER — Encounter (HOSPITAL_COMMUNITY): Payer: Self-pay

## 2017-02-05 ENCOUNTER — Encounter (HOSPITAL_COMMUNITY): Payer: Medicare Other | Attending: Oncology

## 2017-02-05 VITALS — BP 91/59 | HR 79 | Temp 97.9°F | Resp 16 | Wt 155.6 lb

## 2017-02-05 DIAGNOSIS — D473 Essential (hemorrhagic) thrombocythemia: Secondary | ICD-10-CM | POA: Diagnosis not present

## 2017-02-05 DIAGNOSIS — Z833 Family history of diabetes mellitus: Secondary | ICD-10-CM | POA: Insufficient documentation

## 2017-02-05 DIAGNOSIS — Z79899 Other long term (current) drug therapy: Secondary | ICD-10-CM | POA: Diagnosis not present

## 2017-02-05 DIAGNOSIS — D45 Polycythemia vera: Secondary | ICD-10-CM | POA: Diagnosis not present

## 2017-02-05 DIAGNOSIS — Z7982 Long term (current) use of aspirin: Secondary | ICD-10-CM | POA: Diagnosis not present

## 2017-02-05 DIAGNOSIS — Z90710 Acquired absence of both cervix and uterus: Secondary | ICD-10-CM | POA: Diagnosis not present

## 2017-02-05 DIAGNOSIS — Z8249 Family history of ischemic heart disease and other diseases of the circulatory system: Secondary | ICD-10-CM | POA: Diagnosis not present

## 2017-02-05 DIAGNOSIS — I482 Chronic atrial fibrillation: Secondary | ICD-10-CM | POA: Insufficient documentation

## 2017-02-05 DIAGNOSIS — Z9889 Other specified postprocedural states: Secondary | ICD-10-CM | POA: Diagnosis not present

## 2017-02-05 DIAGNOSIS — I739 Peripheral vascular disease, unspecified: Secondary | ICD-10-CM | POA: Insufficient documentation

## 2017-02-05 DIAGNOSIS — D649 Anemia, unspecified: Secondary | ICD-10-CM

## 2017-02-05 DIAGNOSIS — G629 Polyneuropathy, unspecified: Secondary | ICD-10-CM | POA: Insufficient documentation

## 2017-02-05 DIAGNOSIS — D75839 Thrombocytosis, unspecified: Secondary | ICD-10-CM

## 2017-02-05 DIAGNOSIS — I5032 Chronic diastolic (congestive) heart failure: Secondary | ICD-10-CM | POA: Insufficient documentation

## 2017-02-05 DIAGNOSIS — Z823 Family history of stroke: Secondary | ICD-10-CM | POA: Diagnosis not present

## 2017-02-05 DIAGNOSIS — Z9981 Dependence on supplemental oxygen: Secondary | ICD-10-CM | POA: Insufficient documentation

## 2017-02-05 DIAGNOSIS — I110 Hypertensive heart disease with heart failure: Secondary | ICD-10-CM | POA: Insufficient documentation

## 2017-02-05 DIAGNOSIS — E039 Hypothyroidism, unspecified: Secondary | ICD-10-CM | POA: Insufficient documentation

## 2017-02-05 LAB — CBC
HCT: 33.3 % — ABNORMAL LOW (ref 36.0–46.0)
HEMOGLOBIN: 9.2 g/dL — AB (ref 12.0–15.0)
MCH: 20.7 pg — AB (ref 26.0–34.0)
MCHC: 27.6 g/dL — AB (ref 30.0–36.0)
MCV: 75 fL — AB (ref 78.0–100.0)
PLATELETS: 418 10*3/uL — AB (ref 150–400)
RBC: 4.44 MIL/uL (ref 3.87–5.11)
RDW: 23.4 % — ABNORMAL HIGH (ref 11.5–15.5)
WBC: 29.8 10*3/uL — ABNORMAL HIGH (ref 4.0–10.5)

## 2017-02-05 LAB — IRON AND TIBC
IRON: 14 ug/dL — AB (ref 28–170)
Saturation Ratios: 3 % — ABNORMAL LOW (ref 10.4–31.8)
TIBC: 461 ug/dL — ABNORMAL HIGH (ref 250–450)
UIBC: 447 ug/dL

## 2017-02-05 NOTE — Progress Notes (Signed)
[address], [place] [address]    CLINIC:  Medical Oncology/Hematology  PRIMARY CARE PROVIDER:  [name], [place] [address]  REASON FOR FOLLOW-UP:  Polycythemia vera   CURRENT THERAPY: Hydrea   INTERVAL HISTORY: Charlotte [name] 76 y.o. 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    She is seen today with her husband. Patient states that she was having an infection on both her feet and she put on some antibiotic cream that she bought from [employer]. This actually caused a rash to develop on her bilateral feet. She continues to take her hydrea 1000 mg PO daily.  REVIEW OF SYSTEMS:  Review of Systems  Constitutional: Negative for chills, fever, malaise/fatigue and weight loss.  HENT: Negative.   Eyes: Negative.   Respiratory: Negative for cough, hemoptysis, sputum production and shortness of breath.   Cardiovascular: Negative for chest pain and leg swelling.  Gastrointestinal: Negative.  Negative for blood in stool and melena.  Genitourinary: Negative.  Negative for dysuria and hematuria.  Musculoskeletal: Negative.        Complains of a rash around her feet  Skin: Negative.  Negative for rash.  Neurological: Negative.   Endo/Heme/Allergies: Negative.   Psychiatric/Behavioral: Negative.     PAST MEDICAL/SURGICAL HISTORY:  Past Medical History:  Diagnosis Date  . Arthritis   . Atrial fibrillation ([place])   . Atrial fibrillation, chronic ([place])   . CHF (congestive heart failure) ([place])   . Deaf   . Diastolic heart failure ([place])   . DVT (deep venous thrombosis) ([place])   . Essential hypertension   . Hypothyroidism   . On home O2   . Peripheral neuropathy   . Peripheral vascular disease ([place])   . 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 Ulcer of ankle ([place])    Bilateral 2015  . Varicose veins     Past Surgical  History:  Procedure Laterality Date  . ABDOMINAL AORTAGRAM N/A 09/14/2012   Procedure: ABDOMINAL [name];  Surgeon: Serafina Mitchell, MD;  Location: [facility] CATH LAB;  Service: Cardiovascular;  Laterality: N/A;  . ABDOMINAL HYSTERECTOMY    . CORONARY ANGIOPLASTY    . DRESSING CHANGE UNDER ANESTHESIA  12/02/2011   Procedure: DRESSING CHANGE UNDER ANESTHESIA;  Surgeon: Carole Civil, MD;  Location: [facility];  Service: Orthopedics;  Laterality: Right;  . ENDARTERECTOMY FEMORAL Right 09/15/2012   Procedure: ENDARTERECTOMY FEMORAL;  Surgeon: Mal Misty, MD;  Location: [place];  Service: Vascular;  Laterality: Right;  . FASCIOTOMY  11/29/2011   Procedure: FASCIOTOMY;  Surgeon: Carole Civil, MD;  Location: [facility];  Service: Orthopedics;  Laterality: Right;  right thigh   . FEMORAL-POPLITEAL BYPASS GRAFT Right 09/15/2012   Procedure: BYPASS GRAFT FEMORAL-POPLITEAL ARTERY;  Surgeon: Mal Misty, MD;  Location: [place];  Service: Vascular;  Laterality: Right;  . HIP PINNING,CANNULATED  11/19/2011   Procedure: CANNULATED HIP PINNING;  Surgeon: Sanjuana Kava, MD;  Location: [facility];  Service: Orthopedics;  Laterality: Right;  . PATCH ANGIOPLASTY Right 09/15/2012   Procedure: PATCH ANGIOPLASTY;  Surgeon: Mal Misty, MD;  Location: [place];  Service: Vascular;  Laterality: Right;  . TUBAL LIGATION      FAMILY/SOCIAL HISTORY:  Family History  Problem Relation Age of Onset  . Heart failure Mother   . Heart disease Mother   . Stroke Father   . Heart disease Sister   .  Diabetes Son   . Hypertension Sister   . Hypertension Sister   . Stroke Sister     Social History   Social History  . Marital status: Married    Spouse name: N/A  . Number of children: N/A  . Years of education: N/A   Social History Main Topics  . Smoking status: Never Smoker  . Smokeless tobacco: Never Used  . Alcohol use No  . Drug use: No  . Sexual activity: Not Currently   Other Topics Concern  . None   Social  History Narrative  . None     CURRENT MEDICATIONS:  Outpatient Encounter Prescriptions as of 02/05/2017  Medication Sig Note  . aspirin EC 81 MG tablet Take 81 mg by mouth daily.   Marland Kitchen CARTIA XT 240 MG 24 hr capsule TAKE ONE CAPSULE BY MOUTH ONCE DAILY   . Cholecalciferol (VITAMIN D3) 5000 UNITS CAPS Take 1 capsule by mouth daily.   Marland Kitchen gabapentin (NEURONTIN) 100 MG capsule Take 100 mg by mouth 3 (three) times daily.     . hydroxyurea (HYDREA) 500 MG capsule TAKE TWO CAPSULES BY MOUTH ONCE DAILY.  MAY TAKE WITH FOOD TO MINIMIZE GI SIDE EFFECTS   . levothyroxine (SYNTHROID, LEVOTHROID) 75 MCG tablet Take 75 mcg by mouth daily before breakfast.  11/09/2014: Received from: External Pharmacy  . metolazone (ZAROXOLYN) 2.5 MG tablet Take 2.5 mg if wt gain is 3 lbs or more in 24 hrs   . metoprolol (LOPRESSOR) 100 MG tablet Take 100 mg by mouth 2 (two) times daily. 08/07/2015: Received from: External Pharmacy Received Sig:   . Potassium Chloride ER 20 MEQ TBCR Take 20 mEq by mouth daily.   . pravastatin (PRAVACHOL) 20 MG tablet Take 20 mg by mouth daily. Half a tablet everyday   . torsemide (DEMADEX) 20 MG tablet Take 2 tablets (40 mg total) by mouth daily.   Marland Kitchen warfarin (COUMADIN) 5 MG tablet Take 1 tablet daily except 1/2 tablet on Mondays, Wednesdays and Fridays   . silver sulfADIAZINE (SILVADENE) 1 % cream Apply 1 application topically daily. (Patient not taking: Reported on 02/05/2017)    No facility-administered encounter medications on file as of 02/05/2017.     ALLERGIES:  No Known Allergies  PHYSICAL EXAMINATION  ECOG PERFORMANCE STATUS: 1-2   Vitals:   02/05/17 1024  BP: (!) 91/59  Pulse: 79  Resp: 16  Temp: 97.9 F (36.6 C)    Filed Weights   02/05/17 1024  Weight: 155 lb 9.6 oz (70.6 kg)    General: Female in no acute distress wearing oxygen.  Accompanied by her husband.   HEENT: Head is normocephalic.  Pupils equal and reactive to light. Conjunctivae clear without exudate.  Sclerae  anicteric. Oral mucosa is pink and moist without lesions. Oropharynx is pink and moist without lesions. Lymph: No cervical, supraclavicular, infraclavicular lymphadenopathy noted on palpation.   Cardiovascular: Irregular rhythm, normal rate.  Respiratory: Diminished breath sounds throughout, but clear to auscultation bilaterally.  Breathing non-labored.    GU: Deferred.   GI: Soft, non-tender abdomen. Normoactive bowel sounds. No hepatosplenomegaly.  Neuro: No focal deficits. Steady gait.   Psych: Normal mood and affect for situation. Extremities: Erythema over the skin of bilateral feet around the ankles.  LABORATORY DATA: CBC    Component Value Date/Time   WBC 29.8 (H) 02/05/2017 0949   RBC 4.44 02/05/2017 0949   HGB 9.2 (L) 02/05/2017 0949   HCT 33.3 (L) 02/05/2017 0949   PLT 418 (  H) 02/05/2017 0949   MCV 75.0 (L) 02/05/2017 0949   MCH 20.7 (L) 02/05/2017 0949   MCHC 27.6 (L) 02/05/2017 0949   RDW 23.4 (H) 02/05/2017 0949   LYMPHSABS 1.1 07/04/2016 0503   MONOABS 0.6 07/04/2016 0503   EOSABS 0.4 07/04/2016 0503   BASOSABS 0.4 (H) 07/04/2016 0503       Chemistry      Component Value Date/Time   NA 139 08/14/2016 0733   K 4.4 08/14/2016 0733   CL 101 08/14/2016 0733   CO2 28 08/14/2016 0733   BUN 25 08/14/2016 0733   CREATININE 1.21 (H) 08/14/2016 0733      Component Value Date/Time   CALCIUM 9.0 08/14/2016 0733   ALKPHOS 80 07/04/2016 0503   AST 17 07/04/2016 0503   ALT 10 (L) 07/04/2016 0503   BILITOT 2.4 (H) 07/04/2016 0503        PENDING LABS:   RADIOGRAPHIC STUDIES:  No results found.   PATHOLOGY:    ASSESSMENT AND PLAN:  Ms. Amadon is pleasant 76 y.o. female with JAK2+ polycythemia vera. Also h/o chronic A-fib on anticoagulation with coumadin.   Polycythemia vera:  -Patient's hemoglobin continues to trend downwards with a microcytosis. I will add on iron studies to her labs today. Patient denies any bleeding. She should have a macrocytosis  caused by the hydrea, however she doesn't although she states she's compliant. -Given her declining hemoglobin I have advised patient to decrease her hydrea to 500mg  PO daily. -RTC in 2 months for follow up with labs.   ORDERS PLACED FOR THIS ENCOUNTER: Orders Placed This Encounter  Procedures  . Iron and TIBC  . Ferritin  . CBC with Differential  . Comprehensive metabolic panel    THERAPY PLAN:  Continue with Hydrea   All questions were answered. The patient knows to call the clinic with any problems, questions or concerns. We can certainly see the patient much sooner if necessary.  Twana First, MD

## 2017-02-05 NOTE — Addendum Note (Signed)
Addended by: Farley Ly on: 02/05/2017 04:07 PM   Modules accepted: Orders

## 2017-02-07 ENCOUNTER — Other Ambulatory Visit (HOSPITAL_COMMUNITY): Payer: Self-pay | Admitting: Oncology

## 2017-02-07 DIAGNOSIS — D45 Polycythemia vera: Secondary | ICD-10-CM

## 2017-02-12 ENCOUNTER — Inpatient Hospital Stay (HOSPITAL_COMMUNITY)
Admission: EM | Admit: 2017-02-12 | Discharge: 2017-02-15 | DRG: 194 | Disposition: A | Discharge status: Home / Self Care | Payer: Medicare Other | Attending: Internal Medicine | Admitting: Internal Medicine

## 2017-02-12 ENCOUNTER — Encounter (HOSPITAL_COMMUNITY): Payer: Self-pay | Admitting: *Deleted

## 2017-02-12 ENCOUNTER — Emergency Department (HOSPITAL_COMMUNITY): Payer: Medicare Other

## 2017-02-12 ENCOUNTER — Other Ambulatory Visit: Payer: Self-pay

## 2017-02-12 DIAGNOSIS — J9611 Chronic respiratory failure with hypoxia: Secondary | ICD-10-CM | POA: Diagnosis present

## 2017-02-12 DIAGNOSIS — D473 Essential (hemorrhagic) thrombocythemia: Secondary | ICD-10-CM | POA: Diagnosis present

## 2017-02-12 DIAGNOSIS — H9190 Unspecified hearing loss, unspecified ear: Secondary | ICD-10-CM

## 2017-02-12 DIAGNOSIS — D75839 Thrombocytosis, unspecified: Secondary | ICD-10-CM

## 2017-02-12 DIAGNOSIS — Z7901 Long term (current) use of anticoagulants: Secondary | ICD-10-CM

## 2017-02-12 DIAGNOSIS — J189 Pneumonia, unspecified organism: Secondary | ICD-10-CM | POA: Diagnosis present

## 2017-02-12 DIAGNOSIS — R1310 Dysphagia, unspecified: Secondary | ICD-10-CM | POA: Diagnosis present

## 2017-02-12 DIAGNOSIS — Z9981 Dependence on supplemental oxygen: Secondary | ICD-10-CM

## 2017-02-12 DIAGNOSIS — D72829 Elevated white blood cell count, unspecified: Secondary | ICD-10-CM | POA: Diagnosis not present

## 2017-02-12 DIAGNOSIS — J90 Pleural effusion, not elsewhere classified: Secondary | ICD-10-CM | POA: Diagnosis not present

## 2017-02-12 DIAGNOSIS — D45 Polycythemia vera: Secondary | ICD-10-CM | POA: Diagnosis present

## 2017-02-12 DIAGNOSIS — R05 Cough: Secondary | ICD-10-CM | POA: Diagnosis not present

## 2017-02-12 DIAGNOSIS — I739 Peripheral vascular disease, unspecified: Secondary | ICD-10-CM | POA: Diagnosis not present

## 2017-02-12 DIAGNOSIS — I5032 Chronic diastolic (congestive) heart failure: Secondary | ICD-10-CM | POA: Diagnosis not present

## 2017-02-12 DIAGNOSIS — I10 Essential (primary) hypertension: Secondary | ICD-10-CM | POA: Diagnosis not present

## 2017-02-12 DIAGNOSIS — R918 Other nonspecific abnormal finding of lung field: Secondary | ICD-10-CM

## 2017-02-12 DIAGNOSIS — I110 Hypertensive heart disease with heart failure: Secondary | ICD-10-CM | POA: Diagnosis present

## 2017-02-12 DIAGNOSIS — Z8249 Family history of ischemic heart disease and other diseases of the circulatory system: Secondary | ICD-10-CM

## 2017-02-12 DIAGNOSIS — I482 Chronic atrial fibrillation, unspecified: Secondary | ICD-10-CM | POA: Diagnosis present

## 2017-02-12 DIAGNOSIS — Z823 Family history of stroke: Secondary | ICD-10-CM

## 2017-02-12 DIAGNOSIS — D509 Iron deficiency anemia, unspecified: Secondary | ICD-10-CM | POA: Diagnosis not present

## 2017-02-12 DIAGNOSIS — E039 Hypothyroidism, unspecified: Secondary | ICD-10-CM | POA: Diagnosis present

## 2017-02-12 DIAGNOSIS — Z833 Family history of diabetes mellitus: Secondary | ICD-10-CM

## 2017-02-12 DIAGNOSIS — R059 Cough, unspecified: Secondary | ICD-10-CM | POA: Diagnosis present

## 2017-02-12 LAB — BASIC METABOLIC PANEL
ANION GAP: 13 (ref 5–15)
BUN: 38 mg/dL — ABNORMAL HIGH (ref 6–20)
CALCIUM: 8.7 mg/dL — AB (ref 8.9–10.3)
CHLORIDE: 94 mmol/L — AB (ref 101–111)
CO2: 27 mmol/L (ref 22–32)
Creatinine, Ser: 1.54 mg/dL — ABNORMAL HIGH (ref 0.44–1.00)
GFR calc non Af Amer: 32 mL/min — ABNORMAL LOW (ref >60)
GFR, EST AFRICAN AMERICAN: 37 mL/min — AB (ref >60)
GLUCOSE: 85 mg/dL (ref 65–99)
POTASSIUM: 3.6 mmol/L (ref 3.5–5.1)
Sodium: 134 mmol/L — ABNORMAL LOW (ref 135–145)

## 2017-02-12 LAB — CBC WITH DIFFERENTIAL/PLATELET
BASOS ABS: 0.4 10*3/uL — AB (ref 0.0–0.1)
Basophils Relative: 1 %
EOS PCT: 2 %
Eosinophils Absolute: 0.8 10*3/uL — ABNORMAL HIGH (ref 0.0–0.7)
HEMATOCRIT: 33 % — AB (ref 36.0–46.0)
HEMOGLOBIN: 9.3 g/dL — AB (ref 12.0–15.0)
LYMPHS ABS: 1.8 10*3/uL (ref 0.7–4.0)
LYMPHS PCT: 4 %
MCH: 20.8 pg — ABNORMAL LOW (ref 26.0–34.0)
MCHC: 28.2 g/dL — ABNORMAL LOW (ref 30.0–36.0)
MCV: 73.8 fL — AB (ref 78.0–100.0)
Monocytes Absolute: 1.5 10*3/uL — ABNORMAL HIGH (ref 0.1–1.0)
Monocytes Relative: 4 %
NEUTROS ABS: 38 10*3/uL — AB (ref 1.7–7.7)
NEUTROS PCT: 90 %
Platelets: 884 10*3/uL — ABNORMAL HIGH (ref 150–400)
RBC: 4.47 MIL/uL (ref 3.87–5.11)
RDW: 23.2 % — ABNORMAL HIGH (ref 11.5–15.5)
WBC: 43.1 10*3/uL — AB (ref 4.0–10.5)

## 2017-02-12 LAB — HEPATIC FUNCTION PANEL
ALBUMIN: 3.3 g/dL — AB (ref 3.5–5.0)
ALK PHOS: 113 U/L (ref 38–126)
ALT: 9 U/L — ABNORMAL LOW (ref 14–54)
AST: 17 U/L (ref 15–41)
BILIRUBIN TOTAL: 1.7 mg/dL — AB (ref 0.3–1.2)
Bilirubin, Direct: 0.4 mg/dL (ref 0.1–0.5)
Indirect Bilirubin: 1.3 mg/dL — ABNORMAL HIGH (ref 0.3–0.9)
TOTAL PROTEIN: 6.6 g/dL (ref 6.5–8.1)

## 2017-02-12 LAB — CG4 I-STAT (LACTIC ACID)
LACTIC ACID, VENOUS: 1.23 mmol/L (ref 0.5–1.9)
Lactic Acid, Venous: 1.87 mmol/L (ref 0.5–1.9)

## 2017-02-12 LAB — POCT I-STAT TROPONIN I: Troponin i, poc: 0.02 ng/mL (ref 0.00–0.08)

## 2017-02-12 LAB — PROTIME-INR
INR: 7.57
Prothrombin Time: 66.7 seconds — ABNORMAL HIGH (ref 11.4–15.2)

## 2017-02-12 LAB — BRAIN NATRIURETIC PEPTIDE: B Natriuretic Peptide: 608 pg/mL — ABNORMAL HIGH (ref 0.0–100.0)

## 2017-02-12 MED ORDER — HYDROXYUREA 500 MG PO CAPS
500.0000 mg | ORAL_CAPSULE | Freq: Every day | ORAL | Status: DC
  Administered 2017-02-13 – 2017-02-15 (×3): 500 mg via ORAL
  Filled 2017-02-12 (×3): qty 1

## 2017-02-12 MED ORDER — LEVOFLOXACIN IN D5W 500 MG/100ML IV SOLN
500.0000 mg | Freq: Once | INTRAVENOUS | Status: AC
  Administered 2017-02-12: 500 mg via INTRAVENOUS
  Filled 2017-02-12: qty 100

## 2017-02-12 MED ORDER — SODIUM CHLORIDE 0.9% FLUSH: 3.0000 mL | INTRAVENOUS | Status: DC | PRN

## 2017-02-12 MED ORDER — PRAVASTATIN SODIUM 10 MG PO TABS
10.0000 mg | ORAL_TABLET | Freq: Every day | ORAL | Status: DC
  Administered 2017-02-13 – 2017-02-15 (×3): 10 mg via ORAL
  Filled 2017-02-12 (×3): qty 1

## 2017-02-12 MED ORDER — DEXTROSE 5 % IV SOLN
500.0000 mg | INTRAVENOUS | Status: DC
  Administered 2017-02-13 – 2017-02-14 (×2): 500 mg via INTRAVENOUS
  Filled 2017-02-12 (×3): qty 500

## 2017-02-12 MED ORDER — SODIUM CHLORIDE 0.9 % IV SOLN: 250.0000 mL | INTRAVENOUS | Status: DC | PRN

## 2017-02-12 MED ORDER — DEXTROSE 5 % IV SOLN
1.0000 g | INTRAVENOUS | Status: DC
  Administered 2017-02-13 – 2017-02-14 (×3): 1 g via INTRAVENOUS
  Filled 2017-02-12 (×5): qty 10

## 2017-02-12 MED ORDER — TORSEMIDE 20 MG PO TABS
40.0000 mg | ORAL_TABLET | Freq: Every day | ORAL | Status: DC
  Administered 2017-02-13 – 2017-02-15 (×3): 40 mg via ORAL
  Filled 2017-02-12 (×3): qty 2

## 2017-02-12 MED ORDER — SODIUM CHLORIDE 0.9% FLUSH
3.0000 mL | Freq: Two times a day (BID) | INTRAVENOUS | Status: DC
  Administered 2017-02-13 – 2017-02-15 (×6): 3 mL via INTRAVENOUS

## 2017-02-12 MED ORDER — ASPIRIN EC 81 MG PO TBEC
81.0000 mg | DELAYED_RELEASE_TABLET | Freq: Every day | ORAL | Status: DC
  Administered 2017-02-13 – 2017-02-15 (×3): 81 mg via ORAL
  Filled 2017-02-12 (×3): qty 1

## 2017-02-12 MED ORDER — DILTIAZEM HCL ER COATED BEADS 240 MG PO CP24
240.0000 mg | ORAL_CAPSULE | Freq: Every day | ORAL | Status: DC
  Administered 2017-02-13 – 2017-02-15 (×3): 240 mg via ORAL
  Filled 2017-02-12 (×3): qty 1

## 2017-02-12 MED ORDER — VITAMIN D 1000 UNITS PO TABS
5000.0000 [IU] | ORAL_TABLET | Freq: Every day | ORAL | Status: DC
  Administered 2017-02-13 – 2017-02-15 (×3): 5000 [IU] via ORAL
  Filled 2017-02-12 (×3): qty 5

## 2017-02-12 MED ORDER — METOPROLOL TARTRATE 50 MG PO TABS
100.0000 mg | ORAL_TABLET | Freq: Two times a day (BID) | ORAL | Status: DC
  Administered 2017-02-12 – 2017-02-15 (×6): 100 mg via ORAL
  Filled 2017-02-12 (×6): qty 2

## 2017-02-12 MED ORDER — LEVOTHYROXINE SODIUM 75 MCG PO TABS
75.0000 ug | ORAL_TABLET | Freq: Every day | ORAL | Status: DC
  Administered 2017-02-13 – 2017-02-15 (×3): 75 ug via ORAL
  Filled 2017-02-12 (×3): qty 1

## 2017-02-12 MED ORDER — GABAPENTIN 100 MG PO CAPS
100.0000 mg | ORAL_CAPSULE | Freq: Three times a day (TID) | ORAL | Status: DC
  Administered 2017-02-12 – 2017-02-15 (×9): 100 mg via ORAL
  Filled 2017-02-12 (×9): qty 1

## 2017-02-12 MED ORDER — POTASSIUM CHLORIDE CRYS ER 20 MEQ PO TBCR
20.0000 meq | EXTENDED_RELEASE_TABLET | Freq: Every day | ORAL | Status: DC
  Administered 2017-02-12 – 2017-02-15 (×4): 20 meq via ORAL
  Filled 2017-02-12 (×4): qty 1

## 2017-02-12 MED ORDER — WARFARIN - PHARMACIST DOSING INPATIENT: Status: DC

## 2017-02-12 NOTE — ED Triage Notes (Signed)
Pt c/o productive cough, chest congestion x 2-3 days. Denies fever.

## 2017-02-12 NOTE — Progress Notes (Signed)
ANTICOAGULATION CONSULT NOTE - Initial Consult  Pharmacy Consult for Warfarin Indication: atrial fibrillation  No Known Allergies  Patient Measurements: Height: 6\' 1"  (185.4 cm) Weight: 152 lb 3.2 oz (69 kg) IBW/kg (Calculated) : 75.4  Vital Signs: Temp: 97.5 F (36.4 C) (07/12 1357) Temp Source: Oral (07/12 2100) BP: 105/60 (07/12 2100) Pulse Rate: 83 (07/12 2100)  Labs:  Recent Labs  02/12/17 1505  HGB 9.3*  HCT 33.0*  PLT 884*  LABPROT 66.7*  INR 7.57*  CREATININE 1.54*    Estimated Creatinine Clearance: 34.9 mL/min (A) (by C-G formula based on SCr of 1.54 mg/dL (H)).   Medical History: Past Medical History:  Diagnosis Date  . Arthritis   . Atrial fibrillation ([place])   . Atrial fibrillation, chronic ([place])   . CHF (congestive heart failure) ([place])   . Deaf   . Diastolic heart failure ([place])   . DVT (deep venous thrombosis) ([place])   . Essential hypertension   . Hypothyroidism   . On home O2   . Peripheral neuropathy   . Peripheral vascular disease ([place])   . 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 Ulcer of ankle ([place])    Bilateral 2015  . Varicose veins     Medications:  Prescriptions Prior to Admission  Medication Sig Dispense Refill Last Dose  . aspirin EC 81 MG tablet Take 81 mg by mouth daily.   02/12/2017 at Unknown time  . CARTIA XT 240 MG 24 hr capsule TAKE ONE CAPSULE BY MOUTH ONCE DAILY 90 capsule 3 02/12/2017 at Unknown time  . gabapentin (NEURONTIN) 100 MG capsule Take 100 mg by mouth 3 (three) times daily.     02/12/2017 at Unknown time  . hydroxyurea (HYDREA) 500 MG capsule TAKE TWO CAPSULES BY MOUTH ONCE DAILY WITH FOOD TO  MINIMIZE  GI  SIDE  EFFECTS 60 capsule 2 02/12/2017 at Unknown time  . levothyroxine (SYNTHROID, LEVOTHROID) 75 MCG tablet Take 75 mcg by mouth daily before breakfast.    02/12/2017 at Unknown time  . metolazone (ZAROXOLYN) 2.5 MG tablet Take 2.5 mg if wt gain is 3 lbs or more in 24 hrs (Patient taking differently: Take 2.5 mg by  mouth daily as needed (for fluid). Take 2.5 mg if wt gain is 3 lbs or more in 24 hrs) 30 tablet 3 unknown  . metoprolol (LOPRESSOR) 100 MG tablet Take 100 mg by mouth 2 (two) times daily.   02/12/2017 at [place]  . Potassium Chloride ER 20 MEQ TBCR Take 20 mEq by mouth daily. 30 tablet 1 Past Week at Unknown time  . pravastatin (PRAVACHOL) 20 MG tablet Take 10 mg by mouth daily.    02/12/2017 at Unknown time  . torsemide (DEMADEX) 20 MG tablet Take 2 tablets (40 mg total) by mouth daily. 180 tablet 3 02/12/2017 at Unknown time  . warfarin (COUMADIN) 5 MG tablet Take 1 tablet daily except 1/2 tablet on Mondays, Wednesdays and Fridays (Patient taking differently: Take 2.5-5 mg by mouth See admin instructions. Take 1 tablet daily except 1/2 tablet on Mondays, Wednesdays, Fridays, and Saturdays) 90 tablet 4 02/11/2017 at 1800    Assessment: Okay for Protocol, elevated INR.  Goal of Therapy:  INR 2-3   Plan:  No Warfarin today. Daily PT/INR. Monitor for signs and symptoms of bleeding.   Pricilla Larsson 02/12/2017,9:27 PM

## 2017-02-12 NOTE — ED Notes (Signed)
CRITICAL VALUE ALERT  Critical Value:  INR 7.57  Date & Time Notied:  02/12/2017 at 1800  Provider Notified: Dr. Lita Mains   Orders Received/Actions taken:

## 2017-02-12 NOTE — ED Provider Notes (Signed)
[facility] DEPT Provider Note   CSN: [id number] Arrival date & time: 02/12/17  1352     History   Chief Complaint Chief Complaint  Patient presents with  . Cough    HPI Charlotte [name] is a 76 y.o. female.  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 She's had some increased shortness of breath. She denies any chest or thoracic back pain. No new lower extremity swelling. States she is on 3-1/2 L of oxygen at home. Denies fevers or chills. She does sometimes feel bilateral leg weakness. Past Medical History:  Diagnosis Date  . Arthritis   . Atrial fibrillation ([place])   . Atrial fibrillation, chronic ([place])   . CHF (congestive heart failure) ([place])   . Deaf   . Diastolic heart failure ([place])   . DVT (deep venous thrombosis) ([place])   . Essential hypertension   . Hypothyroidism   . On home O2   . Peripheral neuropathy   . Peripheral vascular disease ([place])   . 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 Ulcer of ankle ([place])    Bilateral 2015  . Varicose veins     Patient Active Problem List   Diagnosis Date Noted  . Chronic respiratory failure with hypoxia ([place]) 02/12/2017  . CAP (community acquired pneumonia) 02/12/2017  . Cellulitis 07/03/2016  . Anemia 07/03/2016  . Cough 11/29/2015  . Hypoxemia 11/29/2015  . Community acquired pneumonia 11/29/2015  . CHF (congestive heart failure) ([place]) 11/29/2015  . Acute on chronic respiratory failure with hypoxia ([place])   . Pleural effusion on left   . Acute on chronic diastolic (congestive) heart failure ([place])   . Dyspnea 11/22/2014  . Pleural effusion 11/22/2014  . Hard of hearing 11/22/2014  . Thrombocytosis ([place]) 11/22/2014  . Encounter for therapeutic drug monitoring 08/31/2013  . Aftercare following surgery of the circulatory system, [place] 12/21/2012  . Pain in limb 10/19/2012  . Peripheral vascular disease ([place]) 09/24/2012  . Swollen  leg 09/24/2012  . Varicose veins of lower extremities with other complications [id number]/41/[id number]  . 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 Arterial occlusion due to stenosis ([place]) 08/20/2012  . Wound infection after surgery 01/07/2012  . Compartment syndrome, nontraumatic, lower extremity 11/29/2011  . Pain of right thigh 11/28/2011  . Hip fracture, right ([place]) 11/15/2011  . Chronic atrial fibrillation ([place]) 11/15/2011  . Anticoagulant long-term use 11/15/2011  . HTN (hypertension) 11/15/2011  . Hypothyroidism 11/15/2011  . Polycythemia vera ([place]) 10/01/2011    Past Surgical History:  Procedure Laterality Date  . ABDOMINAL AORTAGRAM N/A 09/14/2012   Procedure: ABDOMINAL [name];  Surgeon: Serafina Mitchell, MD;  Location: [facility] CATH LAB;  Service: Cardiovascular;  Laterality: N/A;  . ABDOMINAL HYSTERECTOMY    . CORONARY ANGIOPLASTY    . DRESSING CHANGE UNDER ANESTHESIA  12/02/2011   Procedure: DRESSING CHANGE UNDER ANESTHESIA;  Surgeon: Carole Civil, MD;  Location: [facility] ORS;  Service: Orthopedics;  Laterality: Right;  . ENDARTERECTOMY FEMORAL Right 09/15/2012   Procedure: ENDARTERECTOMY FEMORAL;  Surgeon: Mal Misty, MD;  Location: [facility];  Service: Vascular;  Laterality: Right;  . FASCIOTOMY  11/29/2011   Procedure: FASCIOTOMY;  Surgeon: Carole Civil, MD;  Location: [facility]S;  Service: Orthopedics;  Laterality: Right;  right thigh   . FEMORAL-POPLITEAL BYPASS GRAFT Right 09/15/2012   Procedure: BYPASS GRAFT FEMORAL-POPLITEAL ARTERY;  Surgeon: Mal Misty, MD;  Location: [facility]  OR;  Service: Vascular;  Laterality: Right;  . HIP PINNING,CANNULATED  11/19/2011   Procedure: CANNULATED HIP PINNING;  Surgeon: Sanjuana Kava, MD;  Location: [facility]S;  Service: Orthopedics;  Laterality: Right;  . PATCH ANGIOPLASTY Right 09/15/2012   Procedure: PATCH ANGIOPLASTY;  Surgeon: Mal Misty, MD;  Location: [facility];  Service: Vascular;  Laterality: Right;  . TUBAL LIGATION       OB History    No data available       Home Medications    Prior to Admission medications   Medication Sig Start Date End Date Taking? Authorizing Provider  aspirin EC 81 MG tablet Take 81 mg by mouth daily.   Yes [provider]  CARTIA XT 240 MG 24 hr capsule TAKE ONE CAPSULE BY MOUTH ONCE DAILY 12/16/16  Yes Herminio Commons, MD  gabapentin (NEURONTIN) 100 MG capsule Take 100 mg by mouth 3 (three) times daily.     Yes [provider]  hydroxyurea (HYDREA) 500 MG capsule TAKE TWO CAPSULES BY MOUTH ONCE DAILY WITH FOOD TO  MINIMIZE  GI  SIDE  EFFECTS 02/08/17  Yes Kefalas, Manon Hilding, PA-C  levothyroxine (SYNTHROID, LEVOTHROID) 75 MCG tablet Take 75 mcg by mouth daily before breakfast.  10/16/14  Yes [provider]  metolazone (ZAROXOLYN) 2.5 MG tablet Take 2.5 mg if wt gain is 3 lbs or more in 24 hrs Patient taking differently: Take 2.5 mg by mouth daily as needed (for fluid). Take 2.5 mg if wt gain is 3 lbs or more in 24 hrs 08/12/16  Yes Herminio Commons, MD  metoprolol (LOPRESSOR) 100 MG tablet Take 100 mg by mouth 2 (two) times daily. 06/05/15  Yes [provider]  Potassium Chloride ER 20 MEQ TBCR Take 20 mEq by mouth daily. 11/29/14  Yes Black, Lezlie Octave, NP  pravastatin (PRAVACHOL) 20 MG tablet Take 10 mg by mouth daily.    Yes [provider]  torsemide (DEMADEX) 20 MG tablet Take 2 tablets (40 mg total) by mouth daily. 03/25/16  Yes Lendon Colonel, NP  warfarin (COUMADIN) 5 MG tablet Take 1 tablet daily except 1/2 tablet on Mondays, Wednesdays and Fridays Patient taking differently: Take 2.5-5 mg by mouth See admin instructions. Take 1 tablet daily except 1/2 tablet on Mondays, Wednesdays, Fridays, and Saturdays 07/31/16  Yes Herminio Commons, MD    Family History Family History  Problem Relation Age of Onset  . Heart failure Mother   . Heart disease Mother   . Stroke Father   . Heart disease Sister   . Diabetes Son     . Hypertension Sister   . Hypertension Sister   . Stroke Sister     Social History Social History  Substance Use Topics  . Smoking status: Never Smoker  . Smokeless tobacco: Never Used  . Alcohol use No     Allergies   Patient has no known allergies.   Review of Systems Review of Systems  Constitutional: Positive for fatigue. Negative for chills and fever.  Respiratory: Positive for cough and shortness of breath. Negative for wheezing.   Cardiovascular: Negative for chest pain, palpitations and leg swelling.  Gastrointestinal: Negative for abdominal pain, diarrhea, nausea and vomiting.  Genitourinary: Negative for dysuria, flank pain and frequency.  Musculoskeletal: Negative for back pain, myalgias, neck pain and neck stiffness.  Skin: Negative for rash and wound.  Neurological: Positive for weakness. Negative for dizziness, numbness and headaches.  All other systems reviewed and  are negative.    Physical Exam Updated Vital Signs BP 105/60 (BP Location: Left Arm)   Pulse 83   Temp 97.6 F (36.4 C) (Oral)   Resp 16   Ht 6\' 1"  (1.854 m)   Wt 69 kg (152 lb 3.2 oz)   SpO2 93%   BMI 20.08 kg/m   Physical Exam  Constitutional: She is oriented to person, place, and time. She appears well-developed and well-nourished. No distress.  HENT:  Head: Normocephalic and atraumatic.  Mouth/Throat: Oropharynx is clear and moist. No oropharyngeal exudate.  Eyes: Pupils are equal, round, and reactive to light. EOM are normal.  Neck: Normal range of motion. Neck supple. No JVD present.  Cardiovascular: Normal rate.   Irregular  Pulmonary/Chest: Effort normal. She has rales.  Scattered rhonchi with mildly diminished lung sounds in the bases.  Abdominal: Soft. Bowel sounds are normal. There is no tenderness. There is no rebound and no guarding.  Musculoskeletal: Normal range of motion. She exhibits no edema or tenderness.  No lower extremity swelling, asymmetry or tenderness.   Lymphadenopathy:    She has no cervical adenopathy.  Neurological: She is alert and oriented to person, place, and time.  Moving all extremities without focal deficit. Sensation intact.  Skin: Skin is warm and dry. No rash noted. No erythema.  Psychiatric: She has a normal mood and affect. Her behavior is normal.  Nursing note and vitals reviewed.    ED Treatments / Results  Labs (all labs ordered are listed, but only abnormal results are displayed) Labs Reviewed  CBC WITH DIFFERENTIAL/PLATELET - Abnormal; Notable for the following:       Result Value   WBC 43.1 (*)    Hemoglobin 9.3 (*)    HCT 33.0 (*)    MCV 73.8 (*)    MCH 20.8 (*)    MCHC 28.2 (*)    RDW 23.2 (*)    Platelets 884 (*)    Neutro Abs 38.0 (*)    Monocytes Absolute 1.5 (*)    Eosinophils Absolute 0.8 (*)    Basophils Absolute 0.4 (*)    All other components within normal limits  BASIC METABOLIC PANEL - Abnormal; Notable for the following:    Sodium 134 (*)    Chloride 94 (*)    BUN 38 (*)    Creatinine, Ser 1.54 (*)    Calcium 8.7 (*)    GFR calc non Af Amer 32 (*)    GFR calc Af Amer 37 (*)    All other components within normal limits  BRAIN NATRIURETIC PEPTIDE - Abnormal; Notable for the following:    B Natriuretic Peptide 608.0 (*)    All other components within normal limits  HEPATIC FUNCTION PANEL - Abnormal; Notable for the following:    Albumin 3.3 (*)    ALT 9 (*)    Total Bilirubin 1.7 (*)    Indirect Bilirubin 1.3 (*)    All other components within normal limits  PROTIME-INR - Abnormal; Notable for the following:    Prothrombin Time 66.7 (*)    INR 7.57 (*)    All other components within normal limits  CULTURE, BLOOD (ROUTINE X 2)  CULTURE, BLOOD (ROUTINE X 2)  CULTURE, EXPECTORATED SPUTUM-ASSESSMENT  GRAM STAIN  HIV ANTIBODY (ROUTINE TESTING)  STREP PNEUMONIAE URINARY ANTIGEN  PROTIME-INR  CG4 I-STAT (LACTIC ACID)  POCT I-STAT TROPONIN I  CG4 I-STAT (LACTIC ACID)    EKG   EKG Interpretation None  Radiology Dg Chest 2 View  Result Date: 02/12/2017 CLINICAL DATA:  Patient with productive cough for multiple days. EXAM: CHEST  2 VIEW COMPARISON:  Chest radiograph 07/03/2016. FINDINGS: Stable cardiomegaly. Small to moderate bilateral pleural effusions with underlying pulmonary consolidation. No pneumothorax. Thoracic spine degenerative changes. IMPRESSION: Small to moderate bilateral pleural effusions with underlying consolidation which may represent atelectasis or infection. Electronically Signed   By: Lovey Newcomer M.D.   On: 02/12/2017 14:17    Procedures Procedures (including critical care time)  Medications Ordered in ED Medications  hydroxyurea (HYDREA) capsule 500 mg (not administered)  pravastatin (PRAVACHOL) tablet 10 mg (not administered)  diltiazem (CARDIZEM CD) 24 hr capsule 240 mg (not administered)  torsemide (DEMADEX) tablet 40 mg (not administered)  metoprolol tartrate (LOPRESSOR) tablet 100 mg (100 mg Oral Given 02/12/17 2335)  cholecalciferol (VITAMIN D) tablet 5,000 Units (not administered)  potassium chloride SA (K-DUR,KLOR-CON) CR tablet 20 mEq (20 mEq Oral Given 02/12/17 2334)  levothyroxine (SYNTHROID, LEVOTHROID) tablet 75 mcg (not administered)  aspirin EC tablet 81 mg (not administered)  gabapentin (NEURONTIN) capsule 100 mg (100 mg Oral Given 02/12/17 2335)  cefTRIAXone (ROCEPHIN) 1 g in dextrose 5 % 50 mL IVPB (not administered)  azithromycin (ZITHROMAX) 500 mg in dextrose 5 % 250 mL IVPB (not administered)  sodium chloride flush (NS) 0.9 % injection 3 mL (not administered)  sodium chloride flush (NS) 0.9 % injection 3 mL (not administered)  0.9 %  sodium chloride infusion (not administered)  Warfarin - Pharmacist Dosing Inpatient (not administered)  levofloxacin (LEVAQUIN) IVPB 500 mg (0 mg Intravenous Stopped 02/12/17 1950)     Initial Impression / Assessment and Plan / ED Course  I have reviewed the triage vital signs and  the nursing notes.  Pertinent labs & imaging results that were available during my care of the patient were reviewed by me and considered in my medical decision making (see chart for details).    Started on antibiotics for presumed pneumonia. Discussed with heme all in the patient's increase in her white blood cell count is likely reactive to infection. We'll need to follow-up closely as an outpatient once patient is discharge. Discussed with hospitalist and will admit.  Final Clinical Impressions(s) / ED Diagnoses   Final diagnoses:  Leukocytosis, unspecified type  Thrombocytosis [facility])  Pulmonary infiltrates    New Prescriptions Current Discharge Medication List       Julianne Rice, MD 02/12/17 2342

## 2017-02-12 NOTE — H&P (Signed)
[facility] History and Physical  Charlotte [name] JKK:[id number] DOB: October 16, 1940 DOA: 02/12/2017  Referring physician: Dr Lita Mains PCP: [name], MD   Chief Complaint: Cough and congestion x 2-3 days  HPI: Charlotte [name] is a 76 y.o. female with history of chronic afib, PVera, diast CHF, PAD, chron resp failure on home O2, deafness and hypothyroid presenting to ED today with 2-3 days hx of progressive cough and nasal congestion.  Sputum has been green per pt.  No LE edema, "I'm taking my fluid pills".  No fevers or chills.  No abd or chest pain, no n/v/d. On home O2 at 3.5 L baseline 24/7.  In ED WBC and plt count are higher than usual at 43k and 884 respectively.  CXR shows bibasilar infiltrates and/or effusions.  Asked to see for admission for CAP.    Pt was in Hematologist's office on July 5th for PV.  Fe studies showed low tsat and she was placed on po Fe tabs and the Hydrea dose was lowered due to lower Hb values.  RTC in 2 mos.   Patient notes swallowing difficulty, "chokes" on her food and her pills at times.    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  Is married and lives w/ her husband still in [place], they have 3 grown boys.  No tob/ etoh.  Husband smoked but quit a few yrs ago.  In her spare time she likes to go out to dinner in [place]. She doesn't use any aids for walking. She is very HOH.   Chart: April 13 - R thigh compartment syndrome, nontraumatic > underwent R thigh fasciotomy and found to have torn vastus intermedius muscle w large hematoma.  Feb 14 - SP right femoral endarterectomy w/ fem-pop bypass grafting Apr 16 - resp distress > pulm edema/ effusions, dx was CHF, normal LVEF by echo. Rx lasix IV and O2.  Rx demadex at DC.  Chron afib also, HTN, low T4.  Jan 17 - cough/ SOB/ weak > RA 81%, bilat PNA, rx for CAP IV abx, IVF and was also +flu A. Improved. DC'd on home O2 3L prn. Hx  PVera on hydrea and asa. CAF on coumadin.  Apr 17 - acute/ chron diast CHF > SOB/ hypoxia, prod cough, felt to have exac of diast CHF. Rx with IV abx for CAP also.  DC' on torsemide.  Dec 17 - L ankle cellulitis, chron afib, diast CHF (stable), PVera  ROS  denies CP  no joint pain   no HA  no blurry vision  no rash  no diarrhea  no nausea/ vomiting  no dysuria  no difficulty voiding  no change in urine color    Past Medical History  Past Medical History:  Diagnosis Date  . Arthritis   . Atrial fibrillation ([place])   . Atrial fibrillation, chronic ([place])   . CHF (congestive heart failure) ([place])   . Deaf   . Diastolic heart failure ([place])   . DVT (deep venous thrombosis) ([place])   . Essential hypertension   . Hypothyroidism   . On home O2   . Peripheral neuropathy   . Peripheral vascular disease ([place])   . 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 Ulcer of ankle ([place])    Bilateral 2015  . Varicose veins    Past Surgical History  Past Surgical History:  Procedure Laterality Date  . ABDOMINAL AORTAGRAM N/A 09/14/2012  Procedure: ABDOMINAL AORTAGRAM;  Surgeon: Serafina Mitchell, MD;  Location: [facility] CATH LAB;  Service: Cardiovascular;  Laterality: N/A;  . ABDOMINAL HYSTERECTOMY    . CORONARY ANGIOPLASTY    . DRESSING CHANGE UNDER ANESTHESIA  12/02/2011   Procedure: DRESSING CHANGE UNDER ANESTHESIA;  Surgeon: Carole Civil, MD;  Location: [facility];  Service: Orthopedics;  Laterality: Right;  . ENDARTERECTOMY FEMORAL Right 09/15/2012   Procedure: ENDARTERECTOMY FEMORAL;  Surgeon: Mal Misty, MD;  Location: [place];  Service: Vascular;  Laterality: Right;  . FASCIOTOMY  11/29/2011   Procedure: FASCIOTOMY;  Surgeon: Carole Civil, MD;  Location: [facility];  Service: Orthopedics;  Laterality: Right;  right thigh   . FEMORAL-POPLITEAL BYPASS GRAFT Right 09/15/2012   Procedure: BYPASS GRAFT FEMORAL-POPLITEAL ARTERY;  Surgeon: Mal Misty, MD;  Location: [place];  Service: Vascular;   Laterality: Right;  . HIP PINNING,CANNULATED  11/19/2011   Procedure: CANNULATED HIP PINNING;  Surgeon: Sanjuana Kava, MD;  Location: [facility];  Service: Orthopedics;  Laterality: Right;  . PATCH ANGIOPLASTY Right 09/15/2012   Procedure: PATCH ANGIOPLASTY;  Surgeon: Mal Misty, MD;  Location: [facility];  Service: Vascular;  Laterality: Right;  . TUBAL LIGATION     Family History  Family History  Problem Relation Age of Onset  . Heart failure Mother   . Heart disease Mother   . Stroke Father   . Heart disease Sister   . Diabetes Son   . Hypertension Sister   . Hypertension Sister   . Stroke Sister    Social History  reports that she has never smoked. She has never used smokeless tobacco. She reports that she does not drink alcohol or use drugs. Allergies No Known Allergies Home medications Prior to Admission medications   Medication Sig Start Date End Date Taking? Authorizing Provider  aspirin EC 81 MG tablet Take 81 mg by mouth daily.    [provider]  CARTIA XT 240 MG 24 hr capsule TAKE ONE CAPSULE BY MOUTH ONCE DAILY 12/16/16   Herminio Commons, MD  Cholecalciferol (VITAMIN D3) 5000 UNITS CAPS Take 1 capsule by mouth daily.    [provider]  gabapentin (NEURONTIN) 100 MG capsule Take 100 mg by mouth 3 (three) times daily.      [provider]  hydroxyurea (HYDREA) 500 MG capsule TAKE TWO CAPSULES BY MOUTH ONCE DAILY WITH FOOD TO  MINIMIZE  GI  SIDE  EFFECTS 02/08/17   Baird Cancer, PA-C  levothyroxine (SYNTHROID, LEVOTHROID) 75 MCG tablet Take 75 mcg by mouth daily before breakfast.  10/16/14   [provider]  metolazone (ZAROXOLYN) 2.5 MG tablet Take 2.5 mg if wt gain is 3 lbs or more in 24 hrs 08/12/16   Herminio Commons, MD  metoprolol (LOPRESSOR) 100 MG tablet Take 100 mg by mouth 2 (two) times daily. 06/05/15   [provider]  Potassium Chloride ER 20 MEQ TBCR Take 20 mEq by mouth daily. 11/29/14   Black, Lezlie Octave, NP   pravastatin (PRAVACHOL) 20 MG tablet Take 20 mg by mouth daily. Half a tablet everyday    [provider]  silver sulfADIAZINE (SILVADENE) 1 % cream Apply 1 application topically daily. Patient not taking: Reported on 02/05/2017 08/19/16   Rosetta Posner, MD  torsemide (DEMADEX) 20 MG tablet Take 2 tablets (40 mg total) by mouth daily. 03/25/16   Lendon Colonel, NP  warfarin (COUMADIN) 5 MG tablet Take 1 tablet daily except 1/2  tablet on Mondays, Wednesdays and Fridays 07/31/16   Herminio Commons, MD   Liver Function Tests  Recent Labs Lab 02/12/17 1505  AST 17  ALT 9*  ALKPHOS 113  BILITOT 1.7*  PROT 6.6  ALBUMIN 3.3*   No results for input(s): LIPASE, AMYLASE in the last 168 hours. CBC  Recent Labs Lab 02/12/17 1505  WBC 43.1*  NEUTROABS 38.0*  HGB 9.3*  HCT 33.0*  MCV 73.8*  PLT 654*   Basic Metabolic Panel  Recent Labs Lab 02/12/17 1505  NA 134*  K 3.6  CL 94*  CO2 27  GLUCOSE 85  BUN 38*  CREATININE 1.54*  CALCIUM 8.7*     Vitals:   02/12/17 1357 02/12/17 1647  BP: (!) 101/54 113/66  Pulse: 67 78  Resp: 18 16  Temp: (!) 97.5 F (36.4 C)   TempSrc: Oral   SpO2: 91% 97%  Weight: 69.4 kg (153 lb)   Height: 6\' 1"  (1.854 m)    Exam: Gen chron ill appearing, pleasant, nasal congestion noted No rash, cyanosis or gangrene Sclera anicteric, throat clear  No jvd or bruits, flat neck veins Chest mildly dec'd BS throughout, faint occ basilar rales bilat but mostly clear Cor irreg irreg no MRG Abd soft ntnd no mass or ascites +bs no bruits GU  defer MS no joint effusions or deformity Ext extensive varicosities in LE's, no pitting LE edema / no wounds or ulcers Neuro is alert, Ox 3 , nf   Home meds > asa, Cartia XT, neurontin, hydrea, T4, metolazone, metoprolol, KCl, pravastatin, demadex 40mg / d, coumadin 5mg  1/2 on MWF and 1 on the other days  Na 134  K 3.6  CO2 27  BUN 38  Cr 1.54   Ca 8.7   Alb 3.3   Tbili 1.7 LFT's ok BNP 608    Trop 0.02   Tsat 3%   LA 1.23 INR 7.57 WBC 43k (baseline 20-30k)  Hb 9.3   Plt 884 (baseline 400- 700)  ECHO April '17 > LVEF 60%, mild LVH, no WMA. Mild MR, severe LAE and RAE.  RV systolic slightly reduced. Mild TR.  PA peak 45mm Hg.     Assess: 1  Comm acquired PNA - w/ prod cough/ purulent sputum/ URI sxs/ basilar infiltrates on CXR.  Basilar changes can be seen in prior CXR's as well, there may be a chronic component.  Doesn't appear vol overloaded on exam, BNP up but was elevated at same level when last checked.  Plan admit, O2, IV abx for PNA.  2  Chron diast CHF - cont torsemide  3  HTN - cont Cartia, metoprolol; bp's soft 4  Chron afib - hold coumadin d/t Jeannie Done, asked pharm for help 5  Polycythemia vera - cont hydrea 500/d 6  Chronic resp failure - on home O2 7  ^^INR - pharm consult, hold coumadin 8  Dysphagia - get Speech consult. May have chronic aspiration  Plan - as above      [place] D [facility] Pager [phone]   If 7PM-7AM, please contact night-coverage [email or web address] Password [facility] 02/12/2017, 6:31 PM

## 2017-02-13 DIAGNOSIS — D45 Polycythemia vera: Secondary | ICD-10-CM | POA: Diagnosis not present

## 2017-02-13 DIAGNOSIS — J189 Pneumonia, unspecified organism: Principal | ICD-10-CM

## 2017-02-13 DIAGNOSIS — I482 Chronic atrial fibrillation: Secondary | ICD-10-CM

## 2017-02-13 DIAGNOSIS — I110 Hypertensive heart disease with heart failure: Secondary | ICD-10-CM | POA: Diagnosis present

## 2017-02-13 DIAGNOSIS — Z823 Family history of stroke: Secondary | ICD-10-CM | POA: Diagnosis not present

## 2017-02-13 DIAGNOSIS — Z8249 Family history of ischemic heart disease and other diseases of the circulatory system: Secondary | ICD-10-CM | POA: Diagnosis not present

## 2017-02-13 DIAGNOSIS — D473 Essential (hemorrhagic) thrombocythemia: Secondary | ICD-10-CM | POA: Diagnosis not present

## 2017-02-13 DIAGNOSIS — D72829 Elevated white blood cell count, unspecified: Secondary | ICD-10-CM | POA: Diagnosis not present

## 2017-02-13 DIAGNOSIS — J9611 Chronic respiratory failure with hypoxia: Secondary | ICD-10-CM | POA: Diagnosis not present

## 2017-02-13 DIAGNOSIS — Z7901 Long term (current) use of anticoagulants: Secondary | ICD-10-CM

## 2017-02-13 DIAGNOSIS — I10 Essential (primary) hypertension: Secondary | ICD-10-CM | POA: Diagnosis not present

## 2017-02-13 DIAGNOSIS — I739 Peripheral vascular disease, unspecified: Secondary | ICD-10-CM | POA: Diagnosis present

## 2017-02-13 DIAGNOSIS — H9190 Unspecified hearing loss, unspecified ear: Secondary | ICD-10-CM | POA: Diagnosis present

## 2017-02-13 DIAGNOSIS — D509 Iron deficiency anemia, unspecified: Secondary | ICD-10-CM | POA: Diagnosis not present

## 2017-02-13 DIAGNOSIS — R1310 Dysphagia, unspecified: Secondary | ICD-10-CM | POA: Diagnosis present

## 2017-02-13 DIAGNOSIS — I5032 Chronic diastolic (congestive) heart failure: Secondary | ICD-10-CM | POA: Diagnosis present

## 2017-02-13 DIAGNOSIS — E039 Hypothyroidism, unspecified: Secondary | ICD-10-CM | POA: Diagnosis present

## 2017-02-13 DIAGNOSIS — Z833 Family history of diabetes mellitus: Secondary | ICD-10-CM | POA: Diagnosis not present

## 2017-02-13 DIAGNOSIS — Z9981 Dependence on supplemental oxygen: Secondary | ICD-10-CM | POA: Diagnosis not present

## 2017-02-13 LAB — STREP PNEUMONIAE URINARY ANTIGEN: Strep Pneumo Urinary Antigen: NEGATIVE

## 2017-02-13 LAB — PROTIME-INR
INR: 6.81 — AB
Prothrombin Time: 61.2 seconds — ABNORMAL HIGH (ref 11.4–15.2)

## 2017-02-13 MED ORDER — GUAIFENESIN ER 600 MG PO TB12
1200.0000 mg | ORAL_TABLET | Freq: Two times a day (BID) | ORAL | Status: DC
  Administered 2017-02-13 – 2017-02-15 (×4): 1200 mg via ORAL
  Filled 2017-02-13 (×4): qty 2

## 2017-02-13 MED ORDER — IPRATROPIUM-ALBUTEROL 0.5-2.5 (3) MG/3ML IN SOLN
3.0000 mL | Freq: Four times a day (QID) | RESPIRATORY_TRACT | Status: DC
  Administered 2017-02-13 – 2017-02-15 (×8): 3 mL via RESPIRATORY_TRACT
  Filled 2017-02-13 (×8): qty 3

## 2017-02-13 MED ORDER — BENZONATATE 100 MG PO CAPS: 100.0000 mg | ORAL_CAPSULE | Freq: Three times a day (TID) | ORAL | Status: DC | PRN

## 2017-02-13 MED ORDER — WARFARIN - PHARMACIST DOSING INPATIENT: Status: DC

## 2017-02-13 NOTE — Progress Notes (Signed)
PROGRESS NOTE    Charlotte [name]  UMP:[id number] DOB: 1940-10-31 DOA: 02/12/2017 PCP: [name], MD    Brief Narrative:  76 year old female with multiple medical problems, presented with cough and congestion for 2-3 days prior to admission. She is found to have pneumonia on chest x-ray and started on intravenous antibiotics.   Assessment & Plan:   Principal Problem:   Community acquired pneumonia Active Problems:   Polycythemia vera ([place])   Chronic atrial fibrillation (HCC)   Anticoagulant long-term use   HTN (hypertension)   Hard of hearing   Thrombocytosis (HCC)   Cough   Chronic respiratory failure with hypoxia (HCC)   CAP (community acquired pneumonia)   1. Community acquired pneumonia. Started on Rocephin and azithromycin. Seen by speech therapy, no concerns for aspiration. Continue current treatments. 2. Chronic diastolic congestive heart failure. No signs of decompensation at this time. Continue on torsemide. 3. Hypertension, continue on cardiac metoprolol. Blood pressures are running on the low side. Continue to monitor 4. Chronic atrial fibrillation. Anticoagulated with Coumadin. INR supratherapeutic. Hold further Coumadin for now. She is rate controlled. 5. Polycythemia vera. Continue on Hydrea. CBC reviewed with hematologist, Dr. Oliva Bustard. Recommendations were to continue outpatient dose of Hydrea for now. Continue to follow CBC 6. Chronic respiratory failure. She is chronically on 3 L of oxygen at home. Currently at her baseline oxygen requirement.   DVT prophylaxis: Coumadin Code Status: Full code Family Communication: No family present Disposition Plan: Discharge home once improved   Consultants:   Speech therapy  Procedures:     Antimicrobials:   Rocephin 7/12>>  Azithromycin 7/12>>   Subjective: Continues to have productive cough. Shortness of breath is improving.  Objective: Vitals:   02/12/17 1834 02/12/17 2100 02/13/17 0546 02/13/17  1351  BP: 109/76 105/60 119/71 (!) 99/58  Pulse: 82 83 88 74  Resp: 16 16 18 18   Temp:  97.6 F (36.4 C) 97.6 F (36.4 C) 98.4 F (36.9 C)  TempSrc:  Oral Oral Oral  SpO2: 94% 93% 96% 92%  Weight:  69 kg (152 lb 3.2 oz)    Height:  6\' 1"  (1.854 m)      Intake/Output Summary (Last 24 hours) at 02/13/17 1912 Last data filed at 02/13/17 1800  Gross per 24 hour  Intake              540 ml  Output             1150 ml  Net             -610 ml   Filed Weights   02/12/17 1357 02/12/17 2100  Weight: 69.4 kg (153 lb) 69 kg (152 lb 3.2 oz)    Examination:  General exam: Appears calm and comfortable  Respiratory system: Clear to auscultation. Respiratory effort normal. Cardiovascular system: S1 & S2 heard,Irregular. No JVD, murmurs, rubs, gallops or clicks. No pedal edema. Gastrointestinal system: Abdomen is nondistended, soft and nontender. No organomegaly or masses felt. Normal bowel sounds heard. Central nervous system: Alert and oriented. No focal neurological deficits. Extremities: Symmetric 5 x 5 power. Skin: No rashes, lesions or ulcers Psychiatry: Judgement and insight appear normal. Mood & affect appropriate.     Data Reviewed: I have personally reviewed following labs and imaging studies  CBC:  Recent Labs Lab 02/12/17 1505  WBC 43.1*  NEUTROABS 38.0*  HGB 9.3*  HCT 33.0*  MCV 73.8*  PLT 400*   Basic Metabolic Panel:  Recent Labs Lab 02/12/17 1505  NA 134*  K 3.6  CL 94*  CO2 27  GLUCOSE 85  BUN 38*  CREATININE 1.54*  CALCIUM 8.7*   GFR: Estimated Creatinine Clearance: 34.9 mL/min (A) (by C-G formula based on SCr of 1.54 mg/dL (H)). Liver Function Tests:  Recent Labs Lab 02/12/17 1505  AST 17  ALT 9*  ALKPHOS 113  BILITOT 1.7*  PROT 6.6  ALBUMIN 3.3*   No results for input(s): LIPASE, AMYLASE in the last 168 hours. No results for input(s): AMMONIA in the last 168 hours. Coagulation Profile:  Recent Labs Lab 02/12/17 1505  02/13/17 0608  INR 7.57* 6.81*   Cardiac Enzymes: No results for input(s): CKTOTAL, CKMB, CKMBINDEX, TROPONINI in the last 168 hours. BNP (last 3 results) No results for input(s): PROBNP in the last 8760 hours. HbA1C: No results for input(s): HGBA1C in the last 72 hours. CBG: No results for input(s): GLUCAP in the last 168 hours. Lipid Profile: No results for input(s): CHOL, HDL, LDLCALC, TRIG, CHOLHDL, LDLDIRECT in the last 72 hours. Thyroid Function Tests: No results for input(s): TSH, T4TOTAL, FREET4, T3FREE, THYROIDAB in the last 72 hours. Anemia Panel: No results for input(s): VITAMINB12, FOLATE, FERRITIN, TIBC, IRON, RETICCTPCT in the last 72 hours. Sepsis Labs:  Recent Labs Lab 02/12/17 1744 02/12/17 1758  LATICACIDVEN 1.87 1.23    Recent Results (from the past 240 hour(s))  Culture, sputum-assessment     Status: None (Preliminary result)   Collection Time: 02/12/17  5:25 AM  Result Value Ref Range Status   Specimen Description SPUTUM  Final   Special Requests NONE  Final   Sputum evaluation THIS SPECIMEN IS ACCEPTABLE FOR SPUTUM CULTURE  Final   Report Status PENDING  Incomplete  Culture, respiratory (NON-Expectorated)     Status: None (Preliminary result)   Collection Time: 02/12/17  5:25 AM  Result Value Ref Range Status   Specimen Description SPUTUM  Final   Special Requests NONE Reflexed from [id number]  Final   Gram Stain   Final    ABUNDANT WBC PRESENT, PREDOMINANTLY PMN NO SQUAMOUS EPITHELIAL CELLS SEEN RARE YEAST Performed at [facility], [address], [place], [place] [address]    Culture PENDING  Incomplete   Report Status PENDING  Incomplete  Culture, blood (Routine X 2) w Reflex to ID Panel     Status: None (Preliminary result)   Collection Time: 02/12/17  3:14 PM  Result Value Ref Range Status   Specimen Description BLOOD LEFT FOREARM  Final   Special Requests   Final    BOTTLES DRAWN AEROBIC AND ANAEROBIC Blood Culture adequate volume    Culture NO GROWTH < 12 HOURS  Final   Report Status PENDING  Incomplete  Culture, blood (Routine X 2) w Reflex to ID Panel     Status: None (Preliminary result)   Collection Time: 02/12/17  5:35 PM  Result Value Ref Range Status   Specimen Description LEFT ANTECUBITAL  Final   Special Requests   Final    BOTTLES DRAWN AEROBIC AND ANAEROBIC Blood Culture adequate volume   Culture NO GROWTH < 12 HOURS  Final   Report Status PENDING  Incomplete         Radiology Studies: Dg Chest 2 View  Result Date: 02/12/2017 CLINICAL DATA:  Patient with productive cough for multiple days. EXAM: CHEST  2 VIEW COMPARISON:  Chest radiograph 07/03/2016. FINDINGS: Stable cardiomegaly. Small to moderate bilateral pleural effusions with underlying pulmonary consolidation. No pneumothorax. Thoracic spine degenerative changes. IMPRESSION:  Small to moderate bilateral pleural effusions with underlying consolidation which may represent atelectasis or infection. Electronically Signed   By: Lovey Newcomer M.D.   On: 02/12/2017 14:17        Scheduled Meds: . aspirin EC  81 mg Oral Daily  . cholecalciferol  5,000 Units Oral Daily  . diltiazem  240 mg Oral Daily  . gabapentin  100 mg Oral TID  . hydroxyurea  500 mg Oral Daily  . levothyroxine  75 mcg Oral QAC breakfast  . metoprolol tartrate  100 mg Oral BID  . potassium chloride SA  20 mEq Oral Daily  . pravastatin  10 mg Oral q1800  . sodium chloride flush  3 mL Intravenous Q12H  . torsemide  40 mg Oral Daily  . Warfarin - Pharmacist Dosing Inpatient   Does not apply Q24H   Continuous Infusions: . sodium chloride    . azithromycin Stopped (02/13/17 1732)  . cefTRIAXone (ROCEPHIN)  IV Stopped (02/13/17 0038)     LOS: 0 days    Time spent: 54mins    Adrian Specht, MD [facility] Pager [phone]  If 7PM-7AM, please contact night-coverage [email or web address] Password [facility] 02/13/2017, 7:12 PM

## 2017-02-13 NOTE — Care Management Note (Signed)
Case Management Note  Patient Details  Name: Charlotte [name] MRN: [id number] Date of Birth: 1941-05-31  Subjective/Objective:                  Pt admitted with CAP. She is from home, lives with her husband. She is ind with ADL's. She has a walker that she doesn't normally use but cane if she needs to. She wears supplemental oxygen pta. She plans to return home at DC. She communicates no needs.   Action/Plan: CM will follow to DC.   Expected Discharge Date:     02/16/2017             Expected Discharge Plan:  Home/Self Care  In-House Referral:  NA  Discharge planning Services  CM Consult  Post Acute Care Choice:  NA Choice offered to:  NA  Status of Service:  In process, will continue to follow  Sherald Barge, RN 02/13/2017, 10:26 AM

## 2017-02-13 NOTE — Progress Notes (Signed)
ANTICOAGULATION CONSULT NOTE - follow up  Pharmacy Consult for Warfarin Indication: atrial fibrillation  No Known Allergies  Patient Measurements: Height: 6\' 1"  (185.4 cm) Weight: 152 lb 3.2 oz (69 kg) IBW/kg (Calculated) : 75.4  Vital Signs: Temp: 97.6 F (36.4 C) (07/13 0546) Temp Source: Oral (07/13 0546) BP: 119/71 (07/13 0546) Pulse Rate: 88 (07/13 0546)  Labs:  Recent Labs  02/12/17 1505 02/13/17 0608  HGB 9.3*  --   HCT 33.0*  --   PLT 884*  --   LABPROT 66.7* 61.2*  INR 7.57* 6.81*  CREATININE 1.54*  --    Estimated Creatinine Clearance: 34.9 mL/min (A) (by C-G formula based on SCr of 1.54 mg/dL (H)).  Medical History: Past Medical History:  Diagnosis Date  . Arthritis   . Atrial fibrillation ([place])   . Atrial fibrillation, chronic ([place])   . CHF (congestive heart failure) ([place])   . Deaf   . Diastolic heart failure ([place])   . DVT (deep venous thrombosis) ([place])   . Essential hypertension   . Hypothyroidism   . On home O2   . Peripheral neuropathy   . Peripheral vascular disease ([place])   . 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 Ulcer of ankle ([place])    Bilateral 2015  . Varicose veins    Medications:  Prescriptions Prior to Admission  Medication Sig Dispense Refill Last Dose  . aspirin EC 81 MG tablet Take 81 mg by mouth daily.   02/12/2017 at Unknown time  . CARTIA XT 240 MG 24 hr capsule TAKE ONE CAPSULE BY MOUTH ONCE DAILY 90 capsule 3 02/12/2017 at Unknown time  . gabapentin (NEURONTIN) 100 MG capsule Take 100 mg by mouth 3 (three) times daily.     02/12/2017 at Unknown time  . hydroxyurea (HYDREA) 500 MG capsule TAKE TWO CAPSULES BY MOUTH ONCE DAILY WITH FOOD TO  MINIMIZE  GI  SIDE  EFFECTS 60 capsule 2 02/12/2017 at Unknown time  . levothyroxine (SYNTHROID, LEVOTHROID) 75 MCG tablet Take 75 mcg by mouth daily before breakfast.    02/12/2017 at Unknown time  . metolazone (ZAROXOLYN) 2.5 MG tablet Take 2.5 mg if wt gain is 3 lbs or more in 24 hrs (Patient  taking differently: Take 2.5 mg by mouth daily as needed (for fluid). Take 2.5 mg if wt gain is 3 lbs or more in 24 hrs) 30 tablet 3 unknown  . metoprolol (LOPRESSOR) 100 MG tablet Take 100 mg by mouth 2 (two) times daily.   02/12/2017 at [place]  . Potassium Chloride ER 20 MEQ TBCR Take 20 mEq by mouth daily. 30 tablet 1 Past Week at Unknown time  . pravastatin (PRAVACHOL) 20 MG tablet Take 10 mg by mouth daily.    02/12/2017 at Unknown time  . torsemide (DEMADEX) 20 MG tablet Take 2 tablets (40 mg total) by mouth daily. 180 tablet 3 02/12/2017 at Unknown time  . warfarin (COUMADIN) 5 MG tablet Take 1 tablet daily except 1/2 tablet on Mondays, Wednesdays and Fridays (Patient taking differently: Take 2.5-5 mg by mouth See admin instructions. Take 1 tablet daily except 1/2 tablet on Mondays, Wednesdays, Fridays, and Saturdays) 90 tablet 4 02/11/2017 at 1800   Assessment: Okay for Protocol, elevated INR, appears to be trending down.  Defer any Vitamin K orders to MD.  Goal of Therapy:  INR 2-3   Plan:  No Warfarin today, allow INR to trend down Daily PT/INR. Monitor for signs and symptoms of bleeding.   Hart Robinsons A  02/13/2017,8:44 AM

## 2017-02-13 NOTE — Care Management Obs Status (Signed)
[place] NOTIFICATION   Patient Details  Name: Charlotte [name] MRN: [id number] Date of Birth: [birth year]   Medicare Observation Status Notification Given:  Yes    Sherald Barge, RN 02/13/2017, 10:26 AM

## 2017-02-13 NOTE — Progress Notes (Signed)
CRITICAL VALUE ALERT  Critical Value:  INR 6.81  Date & Time Notied:  02/13/2017 1660  Provider Notified: Roderic Palau, MD  Orders Received/Actions taken: MD notified, awaiting further instructions

## 2017-02-13 NOTE — Evaluation (Signed)
Clinical/Bedside Swallow Evaluation Patient Details  Name: Charlotte [name] MRN: [id number] Date of Birth: 1940/12/25  Today's Date: 02/13/2017 Time: SLP Start Time (ACUTE ONLY): 1558 SLP Stop Time (ACUTE ONLY): 1623 SLP Time Calculation (min) (ACUTE ONLY): 25 min  Past Medical History:  Past Medical History:  Diagnosis Date  . Arthritis   . Atrial fibrillation ([place])   . Atrial fibrillation, chronic ([place])   . CHF (congestive heart failure) ([place])   . Deaf   . Diastolic heart failure ([place])   . DVT (deep venous thrombosis) ([place])   . Essential hypertension   . Hypothyroidism   . On home O2   . Peripheral neuropathy   . Peripheral vascular disease ([place])   . 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 Ulcer of ankle ([place])    Bilateral 2015  . Varicose veins    Past Surgical History:  Past Surgical History:  Procedure Laterality Date  . ABDOMINAL AORTAGRAM N/A 09/14/2012   Procedure: ABDOMINAL [name];  Surgeon: Serafina Mitchell, MD;  Location: [facility] CATH LAB;  Service: Cardiovascular;  Laterality: N/A;  . ABDOMINAL HYSTERECTOMY    . CORONARY ANGIOPLASTY    . DRESSING CHANGE UNDER ANESTHESIA  12/02/2011   Procedure: DRESSING CHANGE UNDER ANESTHESIA;  Surgeon: Carole Civil, MD;  Location: [facility] ORS;  Service: Orthopedics;  Laterality: Right;  . ENDARTERECTOMY FEMORAL Right 09/15/2012   Procedure: ENDARTERECTOMY FEMORAL;  Surgeon: Mal Misty, MD;  Location: [facility];  Service: Vascular;  Laterality: Right;  . FASCIOTOMY  11/29/2011   Procedure: FASCIOTOMY;  Surgeon: Carole Civil, MD;  Location: [facility];  Service: Orthopedics;  Laterality: Right;  right thigh   . FEMORAL-POPLITEAL BYPASS GRAFT Right 09/15/2012   Procedure: BYPASS GRAFT FEMORAL-POPLITEAL ARTERY;  Surgeon: Mal Misty, MD;  Location: [facility];  Service: Vascular;  Laterality: Right;  . HIP PINNING,CANNULATED  11/19/2011   Procedure: CANNULATED HIP PINNING;  Surgeon: Sanjuana Kava, MD;  Location: [facility];  Service:  Orthopedics;  Laterality: Right;  . PATCH ANGIOPLASTY Right 09/15/2012   Procedure: PATCH ANGIOPLASTY;  Surgeon: Mal Misty, MD;  Location: [facility];  Service: Vascular;  Laterality: Right;  . TUBAL LIGATION     HPI:  Charlotte [name] a 76 y.o.femalewith history of chronic afib, PVera, diast CHF, PAD, chron resp failure on home O2, deafness and hypothyroid presenting to ED today with 2-3 days hx of progressive cough and nasal congestion. Sputum has been green per pt. No LE edema, "I'm taking my fluid pills". No fevers or chills. No abd or chest pain, no n/v/d. On home O2 at 3.5 L baseline 24/7. In ED WBC and plt count are higher than usual at 43k and 884 respectively. CXR shows bibasilar infiltrates and/or effusions. Asked to see for admission for CAP. Pt also reports swallowing diffiuclty; she is currently on a regular/thin diet.   Assessment / Plan / Recommendation Clinical Impression  Pt was assessed at bedside by means of a clinical swallowing evaluation where she consumed regular textures, puree and thin liquids. Pt reports she has occasional difficulty swallowing large pills and that she gets "strangled" approximately once a week. She however consumed all textures and consistencies with no overt s/sx of aspiration. Pt was provided education of universal aspiration precautions and instructed to utilize a slow eating rate, to take small bites/sips and to always be seated at 90 degrees during all PO intake. Pt recommended a persistent swollen lymph gland on the R side of her neck and persistent coughing  any time she lays on that side; Recommend ENT referral to further address this concern (Pt reports she has had this difficulty for ~1 year). Recommend potassium tablet and other large pills be cut in half and crushed if pt demonstrates persistent difficulty with swallowing them. Recommend continue with regular diet and thin liquids. There are no further ST needs noted at this time.  SLP  Visit Diagnosis: Dysphagia, oropharyngeal phase (R13.12)    Aspiration Risk  Mild aspiration risk    Diet Recommendation Regular;Thin liquid   Liquid Administration via: Cup;Straw Medication Administration: Whole meds with liquid (cut large meds in half) Supervision: Intermittent supervision to cue for compensatory strategies Compensations: Minimize environmental distractions;Small sips/bites;Multiple dry swallows after each bite/sip;Follow solids with liquid;Clear throat after each swallow;Hard cough after swallow    Other  Recommendations Recommended Consults: Consider ENT evaluation Oral Care Recommendations: Oral care BID   Follow up Recommendations None             Prognosis Prognosis for Safe Diet Advancement: Good      Swallow Study   General Date of Onset: 02/12/17 HPI: Charlotte [name] a 76 y.o.femalewith history of chronic afib, PVera, diast CHF, PAD, chron resp failure on home O2, deafness and hypothyroid presenting to ED today with 2-3 days hx of progressive cough and nasal congestion. Sputum has been green per pt. No LE edema, "I'm taking my fluid pills". No fevers or chills. No abd or chest pain, no n/v/d. On home O2 at 3.5 L baseline 24/7. In ED WBC and plt count are higher than usual at 43k and 884 respectively. CXR shows bibasilar infiltrates and/or effusions. Asked to see for admission for CAP. Pt also reports swallowing diffiuclty; she is currently on a regular/thin diet. Type of Study: Bedside Swallow Evaluation Previous Swallow Assessment: none Diet Prior to this Study: Regular;Thin liquids Temperature Spikes Noted: No Respiratory Status: Nasal cannula (L3/mn) History of Recent Intubation: No Behavior/Cognition: Alert;Cooperative;Pleasant mood Oral Cavity Assessment: Within Functional Limits Vision: Functional for self-feeding Self-Feeding Abilities: Able to feed self Patient Positioning: Upright in bed Baseline Vocal Quality:  Normal Volitional Cough: Strong Volitional Swallow: Able to elicit    Oral/Motor/Sensory Function Overall Oral Motor/Sensory Function: Within functional limits   Ice Chips Ice chips: Within functional limits   Thin Liquid Thin Liquid: Within functional limits    Nectar Thick Nectar Thick Liquid: Not tested   Honey Thick Honey Thick Liquid: Not tested   Puree Puree: Within functional limits   Solid            Amelia H. Roddie Mc, CCC-SLP Speech Language Pathologist    GO   Solid: Within functional limits    Functional Assessment Tool Used: clinical judgement Functional Limitations: Swallowing Swallow Current Status [phone]): At least 1 percent but less than 20 percent impaired, limited or restricted Swallow Goal Status [phone]): At least 1 percent but less than 20 percent impaired, limited or restricted Swallow Discharge Status [phone]): At least 1 percent but less than 20 percent impaired, limited or restricted   Wende Bushy 02/13/2017,4:26 PM

## 2017-02-14 LAB — HIV ANTIBODY (ROUTINE TESTING W REFLEX): HIV SCREEN 4TH GENERATION: NONREACTIVE

## 2017-02-14 LAB — BASIC METABOLIC PANEL
ANION GAP: 9 (ref 5–15)
BUN: 26 mg/dL — ABNORMAL HIGH (ref 6–20)
CHLORIDE: 99 mmol/L — AB (ref 101–111)
CO2: 28 mmol/L (ref 22–32)
Calcium: 8.4 mg/dL — ABNORMAL LOW (ref 8.9–10.3)
Creatinine, Ser: 1.21 mg/dL — ABNORMAL HIGH (ref 0.44–1.00)
GFR calc Af Amer: 50 mL/min — ABNORMAL LOW (ref >60)
GFR, EST NON AFRICAN AMERICAN: 43 mL/min — AB (ref >60)
Glucose, Bld: 98 mg/dL (ref 65–99)
POTASSIUM: 3.6 mmol/L (ref 3.5–5.1)
SODIUM: 136 mmol/L (ref 135–145)

## 2017-02-14 LAB — CBC
HCT: 31.9 % — ABNORMAL LOW (ref 36.0–46.0)
HEMOGLOBIN: 8.7 g/dL — AB (ref 12.0–15.0)
MCH: 20.2 pg — AB (ref 26.0–34.0)
MCHC: 27.3 g/dL — ABNORMAL LOW (ref 30.0–36.0)
MCV: 74 fL — AB (ref 78.0–100.0)
PLATELETS: 880 10*3/uL — AB (ref 150–400)
RBC: 4.31 MIL/uL (ref 3.87–5.11)
RDW: 22.7 % — ABNORMAL HIGH (ref 11.5–15.5)
WBC: 36.6 10*3/uL — AB (ref 4.0–10.5)

## 2017-02-14 LAB — PROTIME-INR
INR: 4.22
PROTHROMBIN TIME: 43.2 s — AB (ref 11.4–15.2)

## 2017-02-14 NOTE — Progress Notes (Signed)
ANTICOAGULATION CONSULT NOTE - follow up  Pharmacy Consult for Warfarin Indication: atrial fibrillation  No Known Allergies  Patient Measurements: Height: 6\' 1"  (185.4 cm) Weight: 152 lb 3.2 oz (69 kg) IBW/kg (Calculated) : 75.4  Vital Signs:    Labs:  Recent Labs  02/12/17 1505 02/13/17 0608 02/14/17 0547  HGB 9.3*  --  8.7*  HCT 33.0*  --  31.9*  PLT 884*  --  880*  LABPROT 66.7* 61.2* 43.2*  INR 7.57* 6.81* 4.22*  CREATININE 1.54*  --  1.21*   Estimated Creatinine Clearance: 44.4 mL/min (A) (by C-G formula based on SCr of 1.21 mg/dL (H)).  Medical History: Past Medical History:  Diagnosis Date  . Arthritis   . Atrial fibrillation ([place])   . Atrial fibrillation, chronic ([place])   . CHF (congestive heart failure) ([place])   . Deaf   . Diastolic heart failure ([place])   . DVT (deep venous thrombosis) ([place])   . Essential hypertension   . Hypothyroidism   . On home O2   . Peripheral neuropathy   . Peripheral vascular disease ([place])   . 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 Ulcer of ankle ([place])    Bilateral 2015  . Varicose veins    Medications:  Prescriptions Prior to Admission  Medication Sig Dispense Refill Last Dose  . aspirin EC 81 MG tablet Take 81 mg by mouth daily.   02/12/2017 at Unknown time  . CARTIA XT 240 MG 24 hr capsule TAKE ONE CAPSULE BY MOUTH ONCE DAILY 90 capsule 3 02/12/2017 at Unknown time  . gabapentin (NEURONTIN) 100 MG capsule Take 100 mg by mouth 3 (three) times daily.     02/12/2017 at Unknown time  . hydroxyurea (HYDREA) 500 MG capsule TAKE TWO CAPSULES BY MOUTH ONCE DAILY WITH FOOD TO  MINIMIZE  GI  SIDE  EFFECTS 60 capsule 2 02/12/2017 at Unknown time  . levothyroxine (SYNTHROID, LEVOTHROID) 75 MCG tablet Take 75 mcg by mouth daily before breakfast.    02/12/2017 at Unknown time  . metolazone (ZAROXOLYN) 2.5 MG tablet Take 2.5 mg if wt gain is 3 lbs or more in 24 hrs (Patient taking differently: Take 2.5 mg by mouth daily as needed (for fluid).  Take 2.5 mg if wt gain is 3 lbs or more in 24 hrs) 30 tablet 3 unknown  . metoprolol (LOPRESSOR) 100 MG tablet Take 100 mg by mouth 2 (two) times daily.   02/12/2017 at [place]  . Potassium Chloride ER 20 MEQ TBCR Take 20 mEq by mouth daily. 30 tablet 1 Past Week at Unknown time  . pravastatin (PRAVACHOL) 20 MG tablet Take 10 mg by mouth daily.    02/12/2017 at Unknown time  . torsemide (DEMADEX) 20 MG tablet Take 2 tablets (40 mg total) by mouth daily. 180 tablet 3 02/12/2017 at Unknown time  . warfarin (COUMADIN) 5 MG tablet Take 1 tablet daily except 1/2 tablet on Mondays, Wednesdays and Fridays (Patient taking differently: Take 2.5-5 mg by mouth See admin instructions. Take 1 tablet daily except 1/2 tablet on Mondays, Wednesdays, Fridays, and Saturdays) 90 tablet 4 02/11/2017 at 1800   Assessment: Okay for Protocol, elevated INR, appears to be trending down.  Defer any Vitamin K orders to MD.  Goal of Therapy:  INR 2-3   Plan:  No Warfarin today, allow INR to trend down Daily PT/INR. Monitor for signs and symptoms of bleeding.   Hart Robinsons A 02/14/2017,10:34 AM

## 2017-02-14 NOTE — Progress Notes (Signed)
CRITICAL VALUE ALERT  Critical Value:  4.22 INR  Date & Time Notied:  02/14/17 @1003   Provider Notified: Pearletha Forge  Orders Received/Actions taken: MD made aware. Will continue to monitor.

## 2017-02-14 NOTE — Progress Notes (Signed)
PROGRESS NOTE    Charlotte [name]  VHQ:[id number] DOB: 13-Jun-1941 DOA: 02/12/2017 PCP: [name], MD    Brief Narrative:  76 year old female with multiple medical problems, presented with cough and congestion for 2-3 days prior to admission. She is found to have pneumonia on chest x-ray and started on intravenous antibiotics.   Assessment & Plan:   Principal Problem:   Community acquired pneumonia Active Problems:   Polycythemia vera ([place])   Chronic atrial fibrillation (HCC)   Anticoagulant long-term use   HTN (hypertension)   Hard of hearing   Thrombocytosis (HCC)   Cough   Chronic respiratory failure with hypoxia (HCC)   CAP (community acquired pneumonia)   1. Community acquired pneumonia. Continue on Rocephin and azithromycin. Seen by speech therapy, no concerns for aspiration. Continue current treatments. Continue pulmonary hygiene. If continues improvement, possible transition to oral antibiotics in a.m. 2. Chronic diastolic congestive heart failure. No signs of decompensation at this time. Continue on torsemide. 3. Hypertension, continue on Cardizem and metoprolol. Blood pressures are running on the low side. Continue to monitor 4. Chronic atrial fibrillation. Anticoagulated with Coumadin. INR supratherapeutic. Hold further Coumadin for now. INR is trending down. She is rate controlled. 5. Polycythemia vera. Continue on Hydrea. CBC reviewed with hematologist, Dr. Oliva Bustard. Recommendations were to continue outpatient dose of Hydrea for now. Continue to follow CBC 6. Chronic respiratory failure. She is chronically on 3 L of oxygen at home. Currently at her baseline oxygen requirement.   DVT prophylaxis: Coumadin Code Status: Full code Family Communication: No family present Disposition Plan: Discharge home once improved   Consultants:   Speech therapy  Procedures:     Antimicrobials:   Rocephin 7/12>>  Azithromycin 7/12>>   Subjective: Feeling better  today. Shortness of breath is improving. She does have a productive cough.  Objective: Vitals:   02/14/17 0143 02/14/17 0756 02/14/17 1300 02/14/17 1456  BP:   (!) 99/56   Pulse:   75   Resp:   20   Temp:   98.5 F (36.9 C)   TempSrc:   Oral   SpO2: 94% 93% 94% 92%  Weight:      Height:        Intake/Output Summary (Last 24 hours) at 02/14/17 1713 Last data filed at 02/14/17 1141  Gross per 24 hour  Intake              490 ml  Output             1300 ml  Net             -810 ml   Filed Weights   02/12/17 1357 02/12/17 2100  Weight: 69.4 kg (153 lb) 69 kg (152 lb 3.2 oz)    Examination:  General exam: Appears calm and comfortable  Respiratory system: Coarse breath sounds bilaterally. Respiratory effort normal. Cardiovascular system: S1 & S2 heard,Irregular. No JVD, murmurs, rubs, gallops or clicks. No pedal edema. Gastrointestinal system: Abdomen is nondistended, soft and nontender. No organomegaly or masses felt. Normal bowel sounds heard. Central nervous system: Alert and oriented. No focal neurological deficits. Extremities: Symmetric 5 x 5 power. Skin: No rashes, lesions or ulcers Psychiatry: Judgement and insight appear normal. Mood & affect appropriate.     Data Reviewed: I have personally reviewed following labs and imaging studies  CBC:  Recent Labs Lab 02/12/17 1505 02/14/17 0547  WBC 43.1* 36.6*  NEUTROABS 38.0*  --   HGB 9.3* 8.7*  HCT 33.0* 31.9*  MCV 73.8* 74.0*  PLT 884* 347*   Basic Metabolic Panel:  Recent Labs Lab 02/12/17 1505 02/14/17 0547  NA 134* 136  K 3.6 3.6  CL 94* 99*  CO2 27 28  GLUCOSE 85 98  BUN 38* 26*  CREATININE 1.54* 1.21*  CALCIUM 8.7* 8.4*   GFR: Estimated Creatinine Clearance: 44.4 mL/min (A) (by C-G formula based on SCr of 1.21 mg/dL (H)). Liver Function Tests:  Recent Labs Lab 02/12/17 1505  AST 17  ALT 9*  ALKPHOS 113  BILITOT 1.7*  PROT 6.6  ALBUMIN 3.3*   No results for input(s): LIPASE,  AMYLASE in the last 168 hours. No results for input(s): AMMONIA in the last 168 hours. Coagulation Profile:  Recent Labs Lab 02/12/17 1505 02/13/17 0608 02/14/17 0547  INR 7.57* 6.81* 4.22*   Cardiac Enzymes: No results for input(s): CKTOTAL, CKMB, CKMBINDEX, TROPONINI in the last 168 hours. BNP (last 3 results) No results for input(s): PROBNP in the last 8760 hours. HbA1C: No results for input(s): HGBA1C in the last 72 hours. CBG: No results for input(s): GLUCAP in the last 168 hours. Lipid Profile: No results for input(s): CHOL, HDL, LDLCALC, TRIG, CHOLHDL, LDLDIRECT in the last 72 hours. Thyroid Function Tests: No results for input(s): TSH, T4TOTAL, FREET4, T3FREE, THYROIDAB in the last 72 hours. Anemia Panel: No results for input(s): VITAMINB12, FOLATE, FERRITIN, TIBC, IRON, RETICCTPCT in the last 72 hours. Sepsis Labs:  Recent Labs Lab 02/12/17 1744 02/12/17 1758  LATICACIDVEN 1.87 1.23    Recent Results (from the past 240 hour(s))  Culture, sputum-assessment     Status: None (Preliminary result)   Collection Time: 02/12/17  5:25 AM  Result Value Ref Range Status   Specimen Description SPUTUM  Final   Special Requests NONE  Final   Sputum evaluation THIS SPECIMEN IS ACCEPTABLE FOR SPUTUM CULTURE  Final   Report Status PENDING  Incomplete  Culture, respiratory (NON-Expectorated)     Status: None (Preliminary result)   Collection Time: 02/12/17  5:25 AM  Result Value Ref Range Status   Specimen Description SPUTUM  Final   Special Requests NONE Reflexed from [id number]  Final   Gram Stain   Final    ABUNDANT WBC PRESENT, PREDOMINANTLY PMN NO SQUAMOUS EPITHELIAL CELLS SEEN RARE YEAST    Culture   Final    CULTURE REINCUBATED FOR BETTER GROWTH Performed at [facility], [place] [address], [place], [place] [address]    Report Status PENDING  Incomplete  Culture, blood (Routine X 2) w Reflex to ID Panel     Status: None (Preliminary result)   Collection Time:  02/12/17  3:14 PM  Result Value Ref Range Status   Specimen Description BLOOD LEFT FOREARM  Final   Special Requests   Final    BOTTLES DRAWN AEROBIC AND ANAEROBIC Blood Culture adequate volume   Culture NO GROWTH 2 DAYS  Final   Report Status PENDING  Incomplete  Culture, blood (Routine X 2) w Reflex to ID Panel     Status: None (Preliminary result)   Collection Time: 02/12/17  5:35 PM  Result Value Ref Range Status   Specimen Description LEFT ANTECUBITAL  Final   Special Requests   Final    BOTTLES DRAWN AEROBIC AND ANAEROBIC Blood Culture adequate volume   Culture NO GROWTH 2 DAYS  Final   Report Status PENDING  Incomplete         Radiology Studies: No results found.      Scheduled  Meds: . aspirin EC  81 mg Oral Daily  . cholecalciferol  5,000 Units Oral Daily  . diltiazem  240 mg Oral Daily  . gabapentin  100 mg Oral TID  . guaiFENesin  1,200 mg Oral BID  . hydroxyurea  500 mg Oral Daily  . ipratropium-albuterol  3 mL Nebulization Q6H  . levothyroxine  75 mcg Oral QAC breakfast  . metoprolol tartrate  100 mg Oral BID  . potassium chloride SA  20 mEq Oral Daily  . pravastatin  10 mg Oral q1800  . sodium chloride flush  3 mL Intravenous Q12H  . torsemide  40 mg Oral Daily  . Warfarin - Pharmacist Dosing Inpatient   Does not apply Q24H   Continuous Infusions: . sodium chloride    . azithromycin 500 mg (02/14/17 1707)  . cefTRIAXone (ROCEPHIN)  IV Stopped (02/13/17 2251)     LOS: 1 day    Time spent: 23mins    Hira Trent, MD [facility] Pager [phone]  If 7PM-7AM, please contact night-coverage [email or web address] Password [facility] 02/14/2017, 5:13 PM

## 2017-02-15 DIAGNOSIS — D473 Essential (hemorrhagic) thrombocythemia: Secondary | ICD-10-CM

## 2017-02-15 DIAGNOSIS — D72829 Elevated white blood cell count, unspecified: Secondary | ICD-10-CM

## 2017-02-15 DIAGNOSIS — I10 Essential (primary) hypertension: Secondary | ICD-10-CM

## 2017-02-15 LAB — BASIC METABOLIC PANEL
ANION GAP: 13 (ref 5–15)
BUN: 23 mg/dL — AB (ref 6–20)
CALCIUM: 8.9 mg/dL (ref 8.9–10.3)
CO2: 29 mmol/L (ref 22–32)
Chloride: 100 mmol/L — ABNORMAL LOW (ref 101–111)
Creatinine, Ser: 1.11 mg/dL — ABNORMAL HIGH (ref 0.44–1.00)
GFR calc Af Amer: 55 mL/min — ABNORMAL LOW (ref >60)
GFR, EST NON AFRICAN AMERICAN: 48 mL/min — AB (ref >60)
Glucose, Bld: 97 mg/dL (ref 65–99)
POTASSIUM: 4.4 mmol/L (ref 3.5–5.1)
SODIUM: 142 mmol/L (ref 135–145)

## 2017-02-15 LAB — CULTURE, RESPIRATORY W GRAM STAIN

## 2017-02-15 LAB — PROTIME-INR
INR: 2.88
PROTHROMBIN TIME: 30.7 s — AB (ref 11.4–15.2)

## 2017-02-15 LAB — CULTURE, RESPIRATORY: CULTURE: NORMAL

## 2017-02-15 LAB — CBC
HCT: 33.5 % — ABNORMAL LOW (ref 36.0–46.0)
Hemoglobin: 9 g/dL — ABNORMAL LOW (ref 12.0–15.0)
MCH: 20.2 pg — ABNORMAL LOW (ref 26.0–34.0)
MCHC: 26.9 g/dL — ABNORMAL LOW (ref 30.0–36.0)
MCV: 75.1 fL — ABNORMAL LOW (ref 78.0–100.0)
PLATELETS: 984 10*3/uL — AB (ref 150–400)
RBC: 4.46 MIL/uL (ref 3.87–5.11)
RDW: 23 % — AB (ref 11.5–15.5)
WBC: 35.5 10*3/uL — AB (ref 4.0–10.5)

## 2017-02-15 MED ORDER — CEFUROXIME AXETIL 500 MG PO TABS: 500.0000 mg | ORAL_TABLET | Freq: Two times a day (BID) | ORAL | 0 refills | Status: AC

## 2017-02-15 MED ORDER — SODIUM CHLORIDE 0.9 % IV SOLN
510.0000 mg | Freq: Once | INTRAVENOUS | Status: AC
  Administered 2017-02-15: 510 mg via INTRAVENOUS
  Filled 2017-02-15: qty 17

## 2017-02-15 MED ORDER — WARFARIN SODIUM 5 MG PO TABS
5.0000 mg | ORAL_TABLET | Freq: Once | ORAL | Status: AC
  Administered 2017-02-15: 5 mg via ORAL
  Filled 2017-02-15: qty 1

## 2017-02-15 MED ORDER — BENZONATATE 100 MG PO CAPS: 100.0000 mg | ORAL_CAPSULE | Freq: Three times a day (TID) | ORAL | 0 refills | Status: DC | PRN

## 2017-02-15 MED ORDER — AZITHROMYCIN 500 MG PO TABS: 500.0000 mg | ORAL_TABLET | Freq: Every day | ORAL | 0 refills | Status: AC

## 2017-02-15 MED ORDER — HYDROXYUREA 500 MG PO CAPS: 500.0000 mg | ORAL_CAPSULE | Freq: Every day | ORAL | 2 refills | Status: DC

## 2017-02-15 MED ORDER — GUAIFENESIN ER 600 MG PO TB12: 600.0000 mg | ORAL_TABLET | Freq: Two times a day (BID) | ORAL | 0 refills | Status: DC

## 2017-02-15 MED ORDER — ALBUTEROL SULFATE (2.5 MG/3ML) 0.083% IN NEBU: 2.5000 mg | INHALATION_SOLUTION | Freq: Four times a day (QID) | RESPIRATORY_TRACT | 12 refills | Status: DC | PRN

## 2017-02-15 NOTE — Progress Notes (Signed)
CRITICAL VALUE ALERT  Critical Value:  Platelets 984  Date & Time Notifed:  02/15/17 0840  Provider Notified: Dr. Roderic Palau via text page  Orders Received/Actions taken: Dr. Roderic Palau to see patient and consult with hematology.

## 2017-02-15 NOTE — Progress Notes (Signed)
Patient ambulated in hallway on 4L [place].  Sats 91-93%.  Tolerated well.  Dr. Roderic Palau notified via text page.

## 2017-02-15 NOTE — Discharge Summary (Signed)
Physician Discharge Summary  Charlotte [name] GYF:[id number] DOB: 1941/04/23 DOA: 02/12/2017  PCP: [name], MD  Admit date: 02/12/2017 Discharge date: 02/15/2017  Admitted From: home Disposition:  home  Recommendations for Outpatient Follow-up:  1. Follow up with PCP in 1-2 weeks 2. Please obtain BMP/CBC in one week 3. Patient will follow up with oncology next week.  Home Health: Equipment/Devices:oxygen 4L, neb machine  Discharge Condition: stable CODE STATUS: full code Diet recommendation: Heart Healthy  Brief/Interim Summary: 76 year old female with multiple medical problems, presented with cough and congestion for 2-3 days prior to admission. She is found to have pneumonia on chest x-ray and started on intravenous antibiotics.  Discharge Diagnoses:  Principal Problem:   Community acquired pneumonia Active Problems:   Polycythemia vera ([place])   Chronic atrial fibrillation (HCC)   Anticoagulant long-term use   HTN (hypertension)   Hard of hearing   Thrombocytosis (HCC)   Cough   Chronic respiratory failure with hypoxia (HCC)   CAP (community acquired pneumonia)  1. Community acquired pneumonia. Patient was treated with Rocephin and azithromycin. Seen by speech therapy, no concerns for aspiration. She's been afebrile. Transition to oral antibiotics.  Continue pulmonary hygiene. 2. Chronic diastolic congestive heart failure. No signs of decompensation at this time. Continue on torsemide. 3. Hypertension, continue on Cardizem and metoprolol. Blood pressures were stable 4. Chronic atrial fibrillation. Anticoagulated with Coumadin. Heart rate is stable. 5. Polycythemia vera. Continue on Hydrea. Case discussed with Dr. Talbert Cage who is the patient's primary hematologist. CBC was reviewed and it was felt that she likely has an iron deficiency anemia. She was given 1 dose of Feraheme prior to discharge. He was recommended to continue on patient's current dose of Hydrea. She will be  followed up next week by oncology. 6. Chronic respiratory failure. She is chronically on 3 L of oxygen at home. Currently near her baseline oxygen requirement. She is able to ambulate without difficulty.  Discharge Instructions  Discharge Instructions    DME Nebulizer machine    Complete by:  As directed    Patient needs a nebulizer to treat with the following condition:  CHF (congestive heart failure) (HCC)   Diet - low sodium heart healthy    Complete by:  As directed    Increase activity slowly    Complete by:  As directed      Allergies as of 02/15/2017   No Known Allergies     Medication List    TAKE these medications   albuterol (2.5 MG/3ML) 0.083% nebulizer solution Commonly known as:  PROVENTIL Take 3 mLs (2.5 mg total) by nebulization every 6 (six) hours as needed for wheezing or shortness of breath.   aspirin EC 81 MG tablet Take 81 mg by mouth daily.   azithromycin 500 MG tablet Commonly known as:  ZITHROMAX Take 1 tablet (500 mg total) by mouth daily.   benzonatate 100 MG capsule Commonly known as:  TESSALON Take 1 capsule (100 mg total) by mouth 3 (three) times daily as needed for cough.   CARTIA XT 240 MG 24 hr capsule Generic drug:  diltiazem TAKE ONE CAPSULE BY MOUTH ONCE DAILY   cefUROXime 500 MG tablet Commonly known as:  CEFTIN Take 1 tablet (500 mg total) by mouth 2 (two) times daily.   gabapentin 100 MG capsule Commonly known as:  NEURONTIN Take 100 mg by mouth 3 (three) times daily.   guaiFENesin 600 MG 12 hr tablet Commonly known as:  MUCINEX Take 1 tablet (600  mg total) by mouth 2 (two) times daily.   hydroxyurea 500 MG capsule Commonly known as:  HYDREA Take 1 capsule (500 mg total) by mouth daily. May take with food to minimize GI side effects. What changed:  See the new instructions.   levothyroxine 75 MCG tablet Commonly known as:  SYNTHROID, LEVOTHROID Take 75 mcg by mouth daily before breakfast.   metolazone 2.5 MG  tablet Commonly known as:  ZAROXOLYN Take 2.5 mg if wt gain is 3 lbs or more in 24 hrs What changed:  how much to take  how to take this  when to take this  reasons to take this  additional instructions   metoprolol tartrate 100 MG tablet Commonly known as:  LOPRESSOR Take 100 mg by mouth 2 (two) times daily.   Potassium Chloride ER 20 MEQ Tbcr Take 20 mEq by mouth daily.   pravastatin 20 MG tablet Commonly known as:  PRAVACHOL Take 10 mg by mouth daily.   torsemide 20 MG tablet Commonly known as:  DEMADEX Take 2 tablets (40 mg total) by mouth daily.   warfarin 5 MG tablet Commonly known as:  COUMADIN Take 1 tablet daily except 1/2 tablet on Mondays, Wednesdays and Fridays What changed:  how much to take  how to take this  when to take this  additional instructions            Durable Medical Equipment        Start     Ordered   02/15/17 0000  DME Nebulizer machine    Question:  Patient needs a nebulizer to treat with the following condition  Answer:  CHF (congestive heart failure) ([place])   02/15/17 1516      No Known Allergies  Consultations:     Procedures/Studies: Dg Chest 2 View  Result Date: 02/12/2017 CLINICAL DATA:  Patient with productive cough for multiple days. EXAM: CHEST  2 VIEW COMPARISON:  Chest radiograph 07/03/2016. FINDINGS: Stable cardiomegaly. Small to moderate bilateral pleural effusions with underlying pulmonary consolidation. No pneumothorax. Thoracic spine degenerative changes. IMPRESSION: Small to moderate bilateral pleural effusions with underlying consolidation which may represent atelectasis or infection. Electronically Signed   By: Lovey Newcomer M.D.   On: 02/12/2017 14:17       Subjective: Feeling better. Breathing has improved. Still has productive cough  Discharge Exam: Vitals:   02/14/17 2109 02/15/17 0549  BP: 118/65 120/64  Pulse: 95 87  Resp: 20 20  Temp: 98.3 F (36.8 C) 98.2 F (36.8 C)   Vitals:    02/15/17 0132 02/15/17 0549 02/15/17 0800 02/15/17 1452  BP:  120/64    Pulse:  87    Resp:  20    Temp:  98.2 F (36.8 C)    TempSrc:  Oral    SpO2: 95% 94% 95% 94%  Weight:      Height:        General: Pt is alert, awake, not in acute distress Cardiovascular: RRR, S1/S2 +, no rubs, no gallops Respiratory: CTA bilaterally, no wheezing, no rhonchi Abdominal: Soft, NT, ND, bowel sounds + Extremities: no edema, no cyanosis    The results of significant diagnostics from this hospitalization (including imaging, microbiology, ancillary and laboratory) are listed below for reference.     Microbiology: Recent Results (from the past 240 hour(s))  Culture, sputum-assessment     Status: None (Preliminary result)   Collection Time: 02/12/17  5:25 AM  Result Value Ref Range Status   Specimen Description SPUTUM  Final   Special Requests NONE  Final   Sputum evaluation THIS SPECIMEN IS ACCEPTABLE FOR SPUTUM CULTURE  Final   Report Status PENDING  Incomplete  Culture, respiratory (NON-Expectorated)     Status: None   Collection Time: 02/12/17  5:25 AM  Result Value Ref Range Status   Specimen Description SPUTUM  Final   Special Requests NONE Reflexed from [id number]  Final   Gram Stain   Final    ABUNDANT WBC PRESENT, PREDOMINANTLY PMN NO SQUAMOUS EPITHELIAL CELLS SEEN RARE YEAST    Culture   Final    Consistent with normal respiratory flora. Performed at [facility], [place] [address] [place], [place] [address]    Report Status 02/15/2017 FINAL  Final  Culture, blood (Routine X 2) w Reflex to ID Panel     Status: None (Preliminary result)   Collection Time: 02/12/17  3:14 PM  Result Value Ref Range Status   Specimen Description BLOOD LEFT FOREARM  Final   Special Requests   Final    BOTTLES DRAWN AEROBIC AND ANAEROBIC Blood Culture adequate volume   Culture NO GROWTH 3 DAYS  Final   Report Status PENDING  Incomplete  Culture, blood (Routine X 2) w Reflex to ID Panel      Status: None (Preliminary result)   Collection Time: 02/12/17  5:35 PM  Result Value Ref Range Status   Specimen Description LEFT ANTECUBITAL  Final   Special Requests   Final    BOTTLES DRAWN AEROBIC AND ANAEROBIC Blood Culture adequate volume   Culture NO GROWTH 3 DAYS  Final   Report Status PENDING  Incomplete     Labs: BNP (last 3 results)  Recent Labs  07/03/16 1828 02/12/17 1505  BNP 678.0* 902.4*   Basic Metabolic Panel:  Recent Labs Lab 02/12/17 1505 02/14/17 0547 02/15/17 0556  NA 134* 136 142  K 3.6 3.6 4.4  CL 94* 99* 100*  CO2 27 28 29   GLUCOSE 85 98 97  BUN 38* 26* 23*  CREATININE 1.54* 1.21* 1.11*  CALCIUM 8.7* 8.4* 8.9   Liver Function Tests:  Recent Labs Lab 02/12/17 1505  AST 17  ALT 9*  ALKPHOS 113  BILITOT 1.7*  PROT 6.6  ALBUMIN 3.3*   No results for input(s): LIPASE, AMYLASE in the last 168 hours. No results for input(s): AMMONIA in the last 168 hours. CBC:  Recent Labs Lab 02/12/17 1505 02/14/17 0547 02/15/17 0556  WBC 43.1* 36.6* 35.5*  NEUTROABS 38.0*  --   --   HGB 9.3* 8.7* 9.0*  HCT 33.0* 31.9* 33.5*  MCV 73.8* 74.0* 75.1*  PLT 884* 880* 984*   Cardiac Enzymes: No results for input(s): CKTOTAL, CKMB, CKMBINDEX, TROPONINI in the last 168 hours. BNP: Invalid input(s): POCBNP CBG: No results for input(s): GLUCAP in the last 168 hours. D-Dimer No results for input(s): DDIMER in the last 72 hours. Hgb A1c No results for input(s): HGBA1C in the last 72 hours. Lipid Profile No results for input(s): CHOL, HDL, LDLCALC, TRIG, CHOLHDL, LDLDIRECT in the last 72 hours. Thyroid function studies No results for input(s): TSH, T4TOTAL, T3FREE, THYROIDAB in the last 72 hours.  Invalid input(s): FREET3 Anemia work up No results for input(s): VITAMINB12, FOLATE, FERRITIN, TIBC, IRON, RETICCTPCT in the last 72 hours. Urinalysis    Component Value Date/Time   COLORURINE YELLOW 07/03/2016 1900   APPEARANCEUR CLEAR 07/03/2016  1900   LABSPEC 1.015 07/03/2016 1900   PHURINE 5.5 07/03/2016 1900   GLUCOSEU  NEGATIVE 07/03/2016 1900   HGBUR NEGATIVE 07/03/2016 1900   BILIRUBINUR NEGATIVE 07/03/2016 1900   KETONESUR NEGATIVE 07/03/2016 1900   PROTEINUR NEGATIVE 07/03/2016 1900   UROBILINOGEN 1.0 09/14/2012 2337   NITRITE NEGATIVE 07/03/2016 1900   LEUKOCYTESUR SMALL (A) 07/03/2016 1900   Sepsis Labs Invalid input(s): PROCALCITONIN,  WBC,  LACTICIDVEN Microbiology Recent Results (from the past 240 hour(s))  Culture, sputum-assessment     Status: None (Preliminary result)   Collection Time: 02/12/17  5:25 AM  Result Value Ref Range Status   Specimen Description SPUTUM  Final   Special Requests NONE  Final   Sputum evaluation THIS SPECIMEN IS ACCEPTABLE FOR SPUTUM CULTURE  Final   Report Status PENDING  Incomplete  Culture, respiratory (NON-Expectorated)     Status: None   Collection Time: 02/12/17  5:25 AM  Result Value Ref Range Status   Specimen Description SPUTUM  Final   Special Requests NONE Reflexed from [id number]  Final   Gram Stain   Final    ABUNDANT WBC PRESENT, PREDOMINANTLY PMN NO SQUAMOUS EPITHELIAL CELLS SEEN RARE YEAST    Culture   Final    Consistent with normal respiratory flora. Performed at [facility], [place] [address] [place], [place] [address]    Report Status 02/15/2017 FINAL  Final  Culture, blood (Routine X 2) w Reflex to ID Panel     Status: None (Preliminary result)   Collection Time: 02/12/17  3:14 PM  Result Value Ref Range Status   Specimen Description BLOOD LEFT FOREARM  Final   Special Requests   Final    BOTTLES DRAWN AEROBIC AND ANAEROBIC Blood Culture adequate volume   Culture NO GROWTH 3 DAYS  Final   Report Status PENDING  Incomplete  Culture, blood (Routine X 2) w Reflex to ID Panel     Status: None (Preliminary result)   Collection Time: 02/12/17  5:35 PM  Result Value Ref Range Status   Specimen Description LEFT ANTECUBITAL  Final   Special Requests    Final    BOTTLES DRAWN AEROBIC AND ANAEROBIC Blood Culture adequate volume   Culture NO GROWTH 3 DAYS  Final   Report Status PENDING  Incomplete     Time coordinating discharge: Over 30 minutes  SIGNED:   Kathie Dike, MD  [facility] 02/15/2017, 3:17 PM Pager   If 7PM-7AM, please contact night-coverage [email or web address] Password [email or web address]

## 2017-02-15 NOTE — Progress Notes (Signed)
Order for nebulizer machine sent to [facility] per patient request.  Per [place], nebulizer machines are not delivered on the week.  They are either picked up M-F or delivered via mail.  Dr. Roderic Palau notified.  Dr. Roderic Palau stated it is ok if machine is picked up tomorrow.

## 2017-02-15 NOTE — Progress Notes (Signed)
ANTICOAGULATION CONSULT NOTE - follow up  Pharmacy Consult for Warfarin Indication: atrial fibrillation  No Known Allergies  Patient Measurements: Height: 6\' 1"  (185.4 cm) Weight: 152 lb 3.2 oz (69 kg) IBW/kg (Calculated) : 75.4  Vital Signs: Temp: 98.2 F (36.8 C) (07/15 0549) Temp Source: Oral (07/15 0549) BP: 120/64 (07/15 0549) Pulse Rate: 87 (07/15 0549)  Labs:  Recent Labs  02/12/17 1505 02/13/17 0608 02/14/17 0547 02/15/17 0556  HGB 9.3*  --  8.7*  --   HCT 33.0*  --  31.9*  --   PLT 884*  --  880*  --   LABPROT 66.7* 61.2* 43.2* 30.7*  INR 7.57* 6.81* 4.22* 2.88  CREATININE 1.54*  --  1.21* 1.11*   Estimated Creatinine Clearance: 48.4 mL/min (A) (by C-G formula based on SCr of 1.11 mg/dL (H)).  Medical History: Past Medical History:  Diagnosis Date  . Arthritis   . Atrial fibrillation ([place])   . Atrial fibrillation, chronic ([place])   . CHF (congestive heart failure) ([place])   . Deaf   . Diastolic heart failure ([place])   . DVT (deep venous thrombosis) ([place])   . Essential hypertension   . Hypothyroidism   . On home O2   . Peripheral neuropathy   . Peripheral vascular disease ([place])   . 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 Ulcer of ankle ([place])    Bilateral 2015  . Varicose veins    Medications:  Prescriptions Prior to Admission  Medication Sig Dispense Refill Last Dose  . aspirin EC 81 MG tablet Take 81 mg by mouth daily.   02/12/2017 at Unknown time  . CARTIA XT 240 MG 24 hr capsule TAKE ONE CAPSULE BY MOUTH ONCE DAILY 90 capsule 3 02/12/2017 at Unknown time  . gabapentin (NEURONTIN) 100 MG capsule Take 100 mg by mouth 3 (three) times daily.     02/12/2017 at Unknown time  . hydroxyurea (HYDREA) 500 MG capsule TAKE TWO CAPSULES BY MOUTH ONCE DAILY WITH FOOD TO  MINIMIZE  GI  SIDE  EFFECTS 60 capsule 2 02/12/2017 at Unknown time  . levothyroxine (SYNTHROID, LEVOTHROID) 75 MCG tablet Take 75 mcg by mouth daily before breakfast.    02/12/2017 at Unknown time  .  metolazone (ZAROXOLYN) 2.5 MG tablet Take 2.5 mg if wt gain is 3 lbs or more in 24 hrs (Patient taking differently: Take 2.5 mg by mouth daily as needed (for fluid). Take 2.5 mg if wt gain is 3 lbs or more in 24 hrs) 30 tablet 3 unknown  . metoprolol (LOPRESSOR) 100 MG tablet Take 100 mg by mouth 2 (two) times daily.   02/12/2017 at [place]  . Potassium Chloride ER 20 MEQ TBCR Take 20 mEq by mouth daily. 30 tablet 1 Past Week at Unknown time  . pravastatin (PRAVACHOL) 20 MG tablet Take 10 mg by mouth daily.    02/12/2017 at Unknown time  . torsemide (DEMADEX) 20 MG tablet Take 2 tablets (40 mg total) by mouth daily. 180 tablet 3 02/12/2017 at Unknown time  . warfarin (COUMADIN) 5 MG tablet Take 1 tablet daily except 1/2 tablet on Mondays, Wednesdays and Fridays (Patient taking differently: Take 2.5-5 mg by mouth See admin instructions. Take 1 tablet daily except 1/2 tablet on Mondays, Wednesdays, Fridays, and Saturdays) 90 tablet 4 02/11/2017 at 1800   Assessment: Okay for Protocol, elevated INR on admission, has now trended down to goal range.  No Coumadin given since admission.   Goal of Therapy:  INR 2-3  Plan:  Coumadin 5mg  today x 1 Daily PT/INR. Monitor for signs and symptoms of bleeding.   Hart Robinsons A 02/15/2017,8:31 AM

## 2017-02-15 NOTE — Progress Notes (Signed)
Patient's IV removed.  Site WNL.  AVS reviewed with patient and patient's spouse.  Verbalized understanding of discharge instructions, physician follow-up (PCP, oncology), medications.  Husband to pickup nebulizer machine tomorrow.  Patient transported via w/c to main entrance at discharge.  Patient stable at time of discharge.

## 2017-02-16 ENCOUNTER — Telehealth: Payer: Self-pay | Admitting: *Deleted

## 2017-02-16 ENCOUNTER — Ambulatory Visit (INDEPENDENT_AMBULATORY_CARE_PROVIDER_SITE_OTHER): Payer: PRIVATE HEALTH INSURANCE | Admitting: *Deleted

## 2017-02-16 ENCOUNTER — Other Ambulatory Visit (HOSPITAL_COMMUNITY): Payer: Self-pay | Admitting: Oncology

## 2017-02-16 DIAGNOSIS — Z5181 Encounter for therapeutic drug level monitoring: Secondary | ICD-10-CM

## 2017-02-16 DIAGNOSIS — I482 Chronic atrial fibrillation, unspecified: Secondary | ICD-10-CM

## 2017-02-16 DIAGNOSIS — D45 Polycythemia vera: Secondary | ICD-10-CM

## 2017-02-16 LAB — POCT INR: INR: 2.3

## 2017-02-16 LAB — PATHOLOGIST SMEAR REVIEW

## 2017-02-16 NOTE — Telephone Encounter (Signed)
Pt called to change INR apt,transferred to Rennie Natter RN

## 2017-02-16 NOTE — Progress Notes (Signed)
Thank you. We have made a follow up appointment for the patient to see [place] as well as to repeat her labs.

## 2017-02-16 NOTE — Telephone Encounter (Signed)
Pt would like to speak w/you

## 2017-02-17 LAB — CULTURE, BLOOD (ROUTINE X 2)
Culture: NO GROWTH
Culture: NO GROWTH
Special Requests: ADEQUATE
Special Requests: ADEQUATE

## 2017-02-23 DIAGNOSIS — I5030 Unspecified diastolic (congestive) heart failure: Secondary | ICD-10-CM | POA: Diagnosis not present

## 2017-02-23 DIAGNOSIS — J189 Pneumonia, unspecified organism: Secondary | ICD-10-CM | POA: Diagnosis not present

## 2017-02-23 DIAGNOSIS — Z6821 Body mass index (BMI) 21.0-21.9, adult: Secondary | ICD-10-CM | POA: Diagnosis not present

## 2017-02-23 DIAGNOSIS — I4891 Unspecified atrial fibrillation: Secondary | ICD-10-CM | POA: Diagnosis not present

## 2017-02-23 LAB — EXPECTORATED SPUTUM ASSESSMENT W GRAM STAIN, RFLX TO RESP C

## 2017-02-23 LAB — EXPECTORATED SPUTUM ASSESSMENT W REFEX TO RESP CULTURE

## 2017-02-25 ENCOUNTER — Ambulatory Visit (INDEPENDENT_AMBULATORY_CARE_PROVIDER_SITE_OTHER): Payer: PRIVATE HEALTH INSURANCE | Admitting: *Deleted

## 2017-02-25 DIAGNOSIS — Z5181 Encounter for therapeutic drug level monitoring: Secondary | ICD-10-CM

## 2017-02-25 DIAGNOSIS — I482 Chronic atrial fibrillation, unspecified: Secondary | ICD-10-CM

## 2017-02-25 LAB — POCT INR: INR: 1.5

## 2017-03-03 ENCOUNTER — Encounter (HOSPITAL_BASED_OUTPATIENT_CLINIC_OR_DEPARTMENT_OTHER): Payer: Medicare Other | Admitting: Oncology

## 2017-03-03 ENCOUNTER — Encounter (HOSPITAL_COMMUNITY): Payer: Medicare Other

## 2017-03-03 VITALS — BP 104/52 | HR 85 | Temp 97.7°F | Resp 16 | Wt 154.9 lb

## 2017-03-03 DIAGNOSIS — D45 Polycythemia vera: Secondary | ICD-10-CM

## 2017-03-03 LAB — CBC WITH DIFFERENTIAL/PLATELET
BAND NEUTROPHILS: 5 %
BASOS PCT: 2 %
Basophils Absolute: 0.9 10*3/uL — ABNORMAL HIGH (ref 0.0–0.1)
Blasts: 0 %
EOS PCT: 0 %
Eosinophils Absolute: 0 10*3/uL (ref 0.0–0.7)
HEMATOCRIT: 37.9 % (ref 36.0–46.0)
Hemoglobin: 10.6 g/dL — ABNORMAL LOW (ref 12.0–15.0)
LYMPHS ABS: 1.7 10*3/uL (ref 0.7–4.0)
Lymphocytes Relative: 4 %
MCH: 21.9 pg — ABNORMAL LOW (ref 26.0–34.0)
MCHC: 28 g/dL — AB (ref 30.0–36.0)
MCV: 78.1 fL (ref 78.0–100.0)
METAMYELOCYTES PCT: 0 %
MONO ABS: 1.7 10*3/uL — AB (ref 0.1–1.0)
MONOS PCT: 4 %
Myelocytes: 0 %
NEUTROS PCT: 85 %
Neutro Abs: 39.2 10*3/uL — ABNORMAL HIGH (ref 1.7–7.7)
Platelets: 1234 10*3/uL (ref 150–400)
Promyelocytes Absolute: 0 %
RBC: 4.85 MIL/uL (ref 3.87–5.11)
RDW: 25.9 % — AB (ref 11.5–15.5)
WBC: 43.5 10*3/uL — ABNORMAL HIGH (ref 4.0–10.5)
nRBC: 0 /100 WBC

## 2017-03-03 LAB — COMPREHENSIVE METABOLIC PANEL
ALBUMIN: 3.1 g/dL — AB (ref 3.5–5.0)
ALT: 9 U/L — ABNORMAL LOW (ref 14–54)
ANION GAP: 11 (ref 5–15)
AST: 25 U/L (ref 15–41)
Alkaline Phosphatase: 133 U/L — ABNORMAL HIGH (ref 38–126)
BILIRUBIN TOTAL: 1.1 mg/dL (ref 0.3–1.2)
BUN: 33 mg/dL — ABNORMAL HIGH (ref 6–20)
CO2: 29 mmol/L (ref 22–32)
Calcium: 8.7 mg/dL — ABNORMAL LOW (ref 8.9–10.3)
Chloride: 97 mmol/L — ABNORMAL LOW (ref 101–111)
Creatinine, Ser: 1.56 mg/dL — ABNORMAL HIGH (ref 0.44–1.00)
GFR calc Af Amer: 37 mL/min — ABNORMAL LOW (ref >60)
GFR, EST NON AFRICAN AMERICAN: 32 mL/min — AB (ref >60)
Glucose, Bld: 103 mg/dL — ABNORMAL HIGH (ref 65–99)
POTASSIUM: 3.7 mmol/L (ref 3.5–5.1)
Sodium: 137 mmol/L (ref 135–145)
TOTAL PROTEIN: 6.7 g/dL (ref 6.5–8.1)

## 2017-03-03 LAB — IRON AND TIBC
IRON: 15 ug/dL — AB (ref 28–170)
SATURATION RATIOS: 4 % — AB (ref 10.4–31.8)
TIBC: 347 ug/dL (ref 250–450)
UIBC: 332 ug/dL

## 2017-03-03 LAB — FERRITIN: Ferritin: 61 ng/mL (ref 11–307)

## 2017-03-03 MED ORDER — HYDROXYUREA 500 MG PO CAPS: 1000.0000 mg | ORAL_CAPSULE | Freq: Every day | ORAL | 2 refills | Status: DC

## 2017-03-03 NOTE — Addendum Note (Signed)
Addended by: Twana First on: 03/03/2017 12:18 PM   Modules accepted: Orders

## 2017-03-03 NOTE — Patient Instructions (Signed)
[facility] at [facility] Discharge Instructions  RECOMMENDATIONS MADE BY THE CONSULTANT AND ANY TEST RESULTS WILL BE SENT TO YOUR REFERRING PHYSICIAN.    Thank you for choosing [facility] at [facility] to provide your oncology and hematology care.  To afford each patient quality time with our provider, please arrive at least 15 minutes before your scheduled appointment time.    If you have a lab appointment with the Cancer Center please come in thru the  Main Entrance and check in at the main information desk  You need to re-schedule your appointment should you arrive 10 or more minutes late.  We strive to give you quality time with our providers, and arriving late affects you and other patients whose appointments are after yours.  Also, if you no show three or more times for appointments you may be dismissed from the clinic at the providers discretion.     Again, thank you for choosing [facility].  Our hope is that these requests will decrease the amount of time that you wait before being seen by our physicians.       _____________________________________________________________  Should you have questions after your visit to  Cancer Center, please contact our office at [phone] between the hours of 8:30 a.m. and 4:30 p.m.  Voicemails left after 4:30 p.m. will not be returned until the following business day.  For prescription refill requests, have your pharmacy contact our office.       Resources For Cancer Patients and their Caregivers ? American Cancer Society: Can assist with transportation, wigs, general needs, runs Look Good Feel Better.        1-888-227-6333 ? Cancer Care: Provides financial assistance, online support groups, medication/co-pay assistance.  1-800-813-HOPE (4673) ? [facility] Assists [place] cancer patients and their families through emotional , educational  and financial support.  336-427-4357 ? [place] DSS Where to apply for food stamps, Medicaid and utility assistance. 336-342-1394 ? [facility]: Transportation to medical appointments. 336-347-2287 ? Social Security Administration: May apply for disability if have a Stage IV cancer. 336-342-7796 1-800-772-1213 ? [place] Aging, Disability and Transit Services: Assists with nutrition, care and transit needs. 336-349-2343  Cancer Center Support Programs: @[email or web address]@ > Cancer Support Group  2nd Tuesday of the month 1pm-2pm, Journey Room  > Creative Journey  3rd Tuesday of the month 1130am-1pm, Journey Room  > Look Good Feel Better  1st Wednesday of the month 10am-12 noon, Journey Room (Call American Cancer Society to register 1-800-395-5775)   

## 2017-03-03 NOTE — Progress Notes (Addendum)
[address], [place] [address]    CLINIC:  Medical Oncology/Hematology  PRIMARY CARE PROVIDER:  [name], [place] [address]  REASON FOR FOLLOW-UP:  Polycythemia vera   CURRENT THERAPY: Hydrea   INTERVAL HISTORY: Charlotte [name] 76 y.o. 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    She is seen today with her husband. Patient was hospitalized on 02/12/17 for pneumonia. On 02/12/17 her WBC was 40 3.1K, hemoglobin 9.3, hematocrit 33, MCV 73.8, platelet count 884K. At the time of discharge on 02/15/17 her platelet count was 984 K. Patient states that she has been taking her Hydrea and takes 500 mg by mouth daily Monday through Friday. She still has a residual minor cough. She is completed her antibiotic course. She states that her primary care recommended that she stay on oxygen continuously from now on.  REVIEW OF SYSTEMS:  Review of Systems  Constitutional: Negative for chills, fever, malaise/fatigue and weight loss.  HENT: Negative.   Eyes: Negative.   Respiratory: Positive for cough. Negative for hemoptysis, sputum production and shortness of breath.   Cardiovascular: Negative for chest pain and leg swelling.  Gastrointestinal: Negative.  Negative for blood in stool and melena.  Genitourinary: Negative.  Negative for dysuria and hematuria.  Musculoskeletal: Negative.   Skin: Negative.  Negative for rash.  Neurological: Negative.   Endo/Heme/Allergies: Negative.   Psychiatric/Behavioral: Negative.     PAST MEDICAL/SURGICAL HISTORY:  Past Medical History:  Diagnosis Date  . Arthritis   . Atrial fibrillation ([place])   . Atrial fibrillation, chronic ([place])   . CHF (congestive heart failure) ([place])   . Deaf   . Diastolic heart failure ([place])   . DVT (deep venous thrombosis) ([place])   . Essential hypertension   . Hypothyroidism     . On home O2   . Peripheral neuropathy   . Peripheral vascular disease ([place])   . 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 Ulcer of ankle ([place])    Bilateral 2015  . Varicose veins     Past Surgical History:  Procedure Laterality Date  . ABDOMINAL AORTAGRAM N/A 09/14/2012   Procedure: ABDOMINAL [name];  Surgeon: Serafina Mitchell, MD;  Location: [facility] CATH LAB;  Service: Cardiovascular;  Laterality: N/A;  . ABDOMINAL HYSTERECTOMY    . CORONARY ANGIOPLASTY    . DRESSING CHANGE UNDER ANESTHESIA  12/02/2011   Procedure: DRESSING CHANGE UNDER ANESTHESIA;  Surgeon: Carole Civil, MD;  Location: [facility] ORS;  Service: Orthopedics;  Laterality: Right;  . ENDARTERECTOMY FEMORAL Right 09/15/2012   Procedure: ENDARTERECTOMY FEMORAL;  Surgeon: Mal Misty, MD;  Location: [facility];  Service: Vascular;  Laterality: Right;  . FASCIOTOMY  11/29/2011   Procedure: FASCIOTOMY;  Surgeon: Carole Civil, MD;  Location: [facility];  Service: Orthopedics;  Laterality: Right;  right thigh   . FEMORAL-POPLITEAL BYPASS GRAFT Right 09/15/2012   Procedure: BYPASS GRAFT FEMORAL-POPLITEAL ARTERY;  Surgeon: Mal Misty, MD;  Location: [place];  Service: Vascular;  Laterality: Right;  . HIP PINNING,CANNULATED  11/19/2011   Procedure: CANNULATED HIP PINNING;  Surgeon: Sanjuana Kava, MD;  Location: [facility];  Service: Orthopedics;  Laterality: Right;  . PATCH ANGIOPLASTY Right 09/15/2012   Procedure: PATCH ANGIOPLASTY;  Surgeon: Mal Misty, MD;  Location: [facility];  Service: Vascular;  Laterality: Right;  . TUBAL LIGATION      FAMILY/SOCIAL HISTORY:  Family History  Problem Relation Age of Onset  . Heart failure Mother   . Heart disease Mother   . Stroke Father   . Heart disease Sister   . Diabetes Son   . Hypertension Sister   . Hypertension Sister   . Stroke Sister     Social History   Social History  . Marital status: Married    Spouse name: N/A  . Number of children: N/A  . Years of education: N/A   Social  History Main Topics  . Smoking status: Never Smoker  . Smokeless tobacco: Never Used  . Alcohol use No  . Drug use: No  . Sexual activity: Not Currently   Other Topics Concern  . Not on file   Social History Narrative  . No narrative on file     CURRENT MEDICATIONS:  Outpatient Encounter Prescriptions as of 03/03/2017  Medication Sig  . aspirin EC 81 MG tablet Take 81 mg by mouth daily.  . benzonatate (TESSALON) 100 MG capsule Take 1 capsule (100 mg total) by mouth 3 (three) times daily as needed for cough.  . CARTIA XT 240 MG 24 hr capsule TAKE ONE CAPSULE BY MOUTH ONCE DAILY  . gabapentin (NEURONTIN) 100 MG capsule Take 100 mg by mouth 3 (three) times daily.    Marland Kitchen guaiFENesin (MUCINEX) 600 MG 12 hr tablet Take 1 tablet (600 mg total) by mouth 2 (two) times daily.  . hydroxyurea (HYDREA) 500 MG capsule Take 1 capsule (500 mg total) by mouth daily. May take with food to minimize GI side effects.  Marland Kitchen levothyroxine (SYNTHROID, LEVOTHROID) 75 MCG tablet Take 75 mcg by mouth daily before breakfast.   . metolazone (ZAROXOLYN) 2.5 MG tablet Take 2.5 mg if wt gain is 3 lbs or more in 24 hrs (Patient taking differently: Take 2.5 mg by mouth daily as needed (for fluid). Take 2.5 mg if wt gain is 3 lbs or more in 24 hrs)  . metoprolol (LOPRESSOR) 100 MG tablet Take 100 mg by mouth 2 (two) times daily.  . Potassium Chloride ER 20 MEQ TBCR Take 20 mEq by mouth daily.  . pravastatin (PRAVACHOL) 20 MG tablet Take 10 mg by mouth daily.   Marland Kitchen torsemide (DEMADEX) 20 MG tablet Take 2 tablets (40 mg total) by mouth daily.  Marland Kitchen warfarin (COUMADIN) 5 MG tablet Take 1 tablet daily except 1/2 tablet on Mondays, Wednesdays and Fridays (Patient taking differently: Take 2.5-5 mg by mouth See admin instructions. Take 1 tablet daily except 1/2 tablet on Mondays, Wednesdays, Fridays, and Saturdays)  . [DISCONTINUED] albuterol (PROVENTIL) (2.5 MG/3ML) 0.083% nebulizer solution Take 3 mLs (2.5 mg total) by nebulization  every 6 (six) hours as needed for wheezing or shortness of breath.   No facility-administered encounter medications on file as of 03/03/2017.     ALLERGIES:  No Known Allergies  PHYSICAL EXAMINATION  ECOG PERFORMANCE STATUS: 1-2   Vitals:   03/03/17 1104  BP: (!) 104/52  Pulse: 85  Resp: 16  Temp: 97.7 F (36.5 C)    Filed Weights   03/03/17 1104  Weight: 154 lb 14.4 oz (70.3 kg)    General: Female in no acute distress wearing oxygen.  Accompanied by her husband.   HEENT: Head is normocephalic.  Pupils equal and reactive to light. Conjunctivae clear without exudate.  Sclerae anicteric. Oral mucosa is pink and moist without lesions. Oropharynx is pink and moist without lesions. Lymph: No cervical, supraclavicular, infraclavicular lymphadenopathy noted on palpation.   Cardiovascular: Irregular rhythm,  normal rate.  Respiratory: Diminished breath sounds throughout, but clear to auscultation bilaterally.  Breathing non-labored.    GU: Deferred.   GI: Soft, non-tender abdomen. Normoactive bowel sounds. No hepatosplenomegaly.  Neuro: No focal deficits. Steady gait.   Psych: Normal mood and affect for situation. Extremities:No rash or skin lesions.  LABORATORY DATA: CBC    Component Value Date/Time   WBC 35.5 (H) 02/15/2017 0556   RBC 4.46 02/15/2017 0556   HGB 9.0 (L) 02/15/2017 0556   HCT 33.5 (L) 02/15/2017 0556   PLT 984 (HH) 02/15/2017 0556   MCV 75.1 (L) 02/15/2017 0556   MCH 20.2 (L) 02/15/2017 0556   MCHC 26.9 (L) 02/15/2017 0556   RDW 23.0 (H) 02/15/2017 0556   LYMPHSABS 1.8 02/12/2017 1505   MONOABS 1.5 (H) 02/12/2017 1505   EOSABS 0.8 (H) 02/12/2017 1505   BASOSABS 0.4 (H) 02/12/2017 1505       Chemistry      Component Value Date/Time   NA 137 03/03/2017 1039   K 3.7 03/03/2017 1039   CL 97 (L) 03/03/2017 1039   CO2 29 03/03/2017 1039   BUN 33 (H) 03/03/2017 1039   CREATININE 1.56 (H) 03/03/2017 1039   CREATININE 1.21 (H) 08/14/2016 0733        Component Value Date/Time   CALCIUM 8.7 (L) 03/03/2017 1039   ALKPHOS 133 (H) 03/03/2017 1039   AST 25 03/03/2017 1039   ALT 9 (L) 03/03/2017 1039   BILITOT 1.1 03/03/2017 1039        PENDING LABS:   RADIOGRAPHIC STUDIES:  Dg Chest 2 View  Result Date: 02/12/2017 CLINICAL DATA:  Patient with productive cough for multiple days. EXAM: CHEST  2 VIEW COMPARISON:  Chest radiograph 07/03/2016. FINDINGS: Stable cardiomegaly. Small to moderate bilateral pleural effusions with underlying pulmonary consolidation. No pneumothorax. Thoracic spine degenerative changes. IMPRESSION: Small to moderate bilateral pleural effusions with underlying consolidation which may represent atelectasis or infection. Electronically Signed   By: Lovey Newcomer M.D.   On: 02/12/2017 14:17     PATHOLOGY:    ASSESSMENT AND PLAN:  Ms. Guedes is pleasant 76 y.o. female with JAK2+ polycythemia vera. Also h/o chronic A-fib on anticoagulation with coumadin.   Polycythemia vera:  -Her labs are pending today. Her worsening thrombocytosis may have been reactive to the pneumonia. We will see where her platelet count is today. She may need to have her hydrea dose increased if it remains high. Currently patient is taking hydrea 500mg  PO daily M-F. Patient states that she is compliant with her hydrea, however she should have a macrocytosis due to the hydrea and she is microcytic instead. Follow up on her iron studies from today. -RTC in 2 months for follow up with labs.   ORDERS PLACED FOR THIS ENCOUNTER: Orders Placed This Encounter  Procedures  . CBC with Differential  . Comprehensive metabolic panel    THERAPY PLAN:  Continue with Hydrea   All questions were answered. The patient knows to call the clinic with any problems, questions or concerns. We can certainly see the patient much sooner if necessary.  Twana First, MD  ADDENDUM: Got patient's CBC back. CBC    Component Value Date/Time   WBC 43.5 (H)  03/03/2017 1039   RBC 4.85 03/03/2017 1039   HGB 10.6 (L) 03/03/2017 1039   HCT 37.9 03/03/2017 1039   PLT 1,234 (HH) 03/03/2017 1039   MCV 78.1 03/03/2017 1039   MCH 21.9 (L) 03/03/2017 1039   MCHC 28.0 (L)  03/03/2017 1039   RDW 25.9 (H) 03/03/2017 1039   LYMPHSABS 1.7 03/03/2017 1039   MONOABS 1.7 (H) 03/03/2017 1039   EOSABS 0.0 03/03/2017 1039   BASOSABS 0.9 (H) 03/03/2017 1039   Advised patient to increase her hydrea to 1000mg  PO daily. Patient verbalized understanding and repeated the directions back to me. New Rx sent to her pharmacy. 02/21/17 12:18 pm

## 2017-03-03 NOTE — Progress Notes (Unsigned)
CRITICAL VALUE STICKER  CRITICAL VALUE: platelet 1234  RECEIVER: Jene Every, RN   DATE & TIME NOTIFIED: 1200 03/03/17  MD NOTIFIED: Dr. Talbert Cage notified 1210

## 2017-03-04 ENCOUNTER — Inpatient Hospital Stay (HOSPITAL_COMMUNITY)
Admission: EM | Admit: 2017-03-04 | Discharge: 2017-03-29 | DRG: 252 | Disposition: A | Discharge status: Other Acute Inpatient Hospital | Payer: Medicare Other | Attending: Internal Medicine | Admitting: Internal Medicine

## 2017-03-04 ENCOUNTER — Encounter (HOSPITAL_COMMUNITY): Payer: Self-pay | Admitting: *Deleted

## 2017-03-04 DIAGNOSIS — R0603 Acute respiratory distress: Secondary | ICD-10-CM

## 2017-03-04 DIAGNOSIS — I340 Nonrheumatic mitral (valve) insufficiency: Secondary | ICD-10-CM | POA: Diagnosis not present

## 2017-03-04 DIAGNOSIS — D509 Iron deficiency anemia, unspecified: Secondary | ICD-10-CM | POA: Diagnosis not present

## 2017-03-04 DIAGNOSIS — R918 Other nonspecific abnormal finding of lung field: Secondary | ICD-10-CM | POA: Diagnosis not present

## 2017-03-04 DIAGNOSIS — R0902 Hypoxemia: Secondary | ICD-10-CM

## 2017-03-04 DIAGNOSIS — J9600 Acute respiratory failure, unspecified whether with hypoxia or hypercapnia: Secondary | ICD-10-CM

## 2017-03-04 DIAGNOSIS — R34 Anuria and oliguria: Secondary | ICD-10-CM | POA: Diagnosis not present

## 2017-03-04 DIAGNOSIS — E038 Other specified hypothyroidism: Secondary | ICD-10-CM | POA: Diagnosis not present

## 2017-03-04 DIAGNOSIS — L03116 Cellulitis of left lower limb: Secondary | ICD-10-CM | POA: Diagnosis not present

## 2017-03-04 DIAGNOSIS — Z7901 Long term (current) use of anticoagulants: Secondary | ICD-10-CM

## 2017-03-04 DIAGNOSIS — H9190 Unspecified hearing loss, unspecified ear: Secondary | ICD-10-CM | POA: Diagnosis present

## 2017-03-04 DIAGNOSIS — I959 Hypotension, unspecified: Secondary | ICD-10-CM | POA: Diagnosis not present

## 2017-03-04 DIAGNOSIS — R1111 Vomiting without nausea: Secondary | ICD-10-CM | POA: Diagnosis not present

## 2017-03-04 DIAGNOSIS — I509 Heart failure, unspecified: Secondary | ICD-10-CM | POA: Diagnosis not present

## 2017-03-04 DIAGNOSIS — I361 Nonrheumatic tricuspid (valve) insufficiency: Secondary | ICD-10-CM | POA: Diagnosis not present

## 2017-03-04 DIAGNOSIS — E46 Unspecified protein-calorie malnutrition: Secondary | ICD-10-CM | POA: Diagnosis not present

## 2017-03-04 DIAGNOSIS — I70261 Atherosclerosis of native arteries of extremities with gangrene, right leg: Secondary | ICD-10-CM | POA: Diagnosis not present

## 2017-03-04 DIAGNOSIS — D45 Polycythemia vera: Secondary | ICD-10-CM | POA: Diagnosis present

## 2017-03-04 DIAGNOSIS — D689 Coagulation defect, unspecified: Secondary | ICD-10-CM | POA: Diagnosis not present

## 2017-03-04 DIAGNOSIS — I482 Chronic atrial fibrillation, unspecified: Secondary | ICD-10-CM | POA: Diagnosis present

## 2017-03-04 DIAGNOSIS — I481 Persistent atrial fibrillation: Secondary | ICD-10-CM | POA: Diagnosis not present

## 2017-03-04 DIAGNOSIS — M79604 Pain in right leg: Secondary | ICD-10-CM

## 2017-03-04 DIAGNOSIS — N179 Acute kidney failure, unspecified: Secondary | ICD-10-CM | POA: Diagnosis not present

## 2017-03-04 DIAGNOSIS — J9621 Acute and chronic respiratory failure with hypoxia: Secondary | ICD-10-CM | POA: Diagnosis not present

## 2017-03-04 DIAGNOSIS — I700 Atherosclerosis of aorta: Secondary | ICD-10-CM | POA: Diagnosis present

## 2017-03-04 DIAGNOSIS — Z9981 Dependence on supplemental oxygen: Secondary | ICD-10-CM | POA: Diagnosis not present

## 2017-03-04 DIAGNOSIS — Z95828 Presence of other vascular implants and grafts: Secondary | ICD-10-CM | POA: Diagnosis not present

## 2017-03-04 DIAGNOSIS — Z823 Family history of stroke: Secondary | ICD-10-CM

## 2017-03-04 DIAGNOSIS — R579 Shock, unspecified: Secondary | ICD-10-CM | POA: Diagnosis not present

## 2017-03-04 DIAGNOSIS — Z7982 Long term (current) use of aspirin: Secondary | ICD-10-CM

## 2017-03-04 DIAGNOSIS — A419 Sepsis, unspecified organism: Secondary | ICD-10-CM | POA: Diagnosis not present

## 2017-03-04 DIAGNOSIS — J449 Chronic obstructive pulmonary disease, unspecified: Secondary | ICD-10-CM | POA: Diagnosis present

## 2017-03-04 DIAGNOSIS — J440 Chronic obstructive pulmonary disease with acute lower respiratory infection: Secondary | ICD-10-CM | POA: Diagnosis not present

## 2017-03-04 DIAGNOSIS — D473 Essential (hemorrhagic) thrombocythemia: Secondary | ICD-10-CM | POA: Diagnosis present

## 2017-03-04 DIAGNOSIS — R339 Retention of urine, unspecified: Secondary | ICD-10-CM | POA: Diagnosis not present

## 2017-03-04 DIAGNOSIS — J9690 Respiratory failure, unspecified, unspecified whether with hypoxia or hypercapnia: Secondary | ICD-10-CM | POA: Diagnosis not present

## 2017-03-04 DIAGNOSIS — E785 Hyperlipidemia, unspecified: Secondary | ICD-10-CM | POA: Diagnosis present

## 2017-03-04 DIAGNOSIS — I5033 Acute on chronic diastolic (congestive) heart failure: Secondary | ICD-10-CM | POA: Diagnosis not present

## 2017-03-04 DIAGNOSIS — R1313 Dysphagia, pharyngeal phase: Secondary | ICD-10-CM | POA: Diagnosis not present

## 2017-03-04 DIAGNOSIS — E876 Hypokalemia: Secondary | ICD-10-CM | POA: Diagnosis not present

## 2017-03-04 DIAGNOSIS — J150 Pneumonia due to Klebsiella pneumoniae: Secondary | ICD-10-CM | POA: Diagnosis not present

## 2017-03-04 DIAGNOSIS — E875 Hyperkalemia: Secondary | ICD-10-CM | POA: Diagnosis not present

## 2017-03-04 DIAGNOSIS — L8990 Pressure ulcer of unspecified site, unspecified stage: Secondary | ICD-10-CM | POA: Insufficient documentation

## 2017-03-04 DIAGNOSIS — R740 Nonspecific elevation of levels of transaminase and lactic acid dehydrogenase [LDH]: Secondary | ICD-10-CM

## 2017-03-04 DIAGNOSIS — Z833 Family history of diabetes mellitus: Secondary | ICD-10-CM

## 2017-03-04 DIAGNOSIS — Z452 Encounter for adjustment and management of vascular access device: Secondary | ICD-10-CM | POA: Diagnosis not present

## 2017-03-04 DIAGNOSIS — I70621 Atherosclerosis of nonbiological bypass graft(s) of the extremities with rest pain, right leg: Principal | ICD-10-CM | POA: Diagnosis present

## 2017-03-04 DIAGNOSIS — N189 Chronic kidney disease, unspecified: Secondary | ICD-10-CM | POA: Diagnosis not present

## 2017-03-04 DIAGNOSIS — E872 Acidosis: Secondary | ICD-10-CM | POA: Diagnosis not present

## 2017-03-04 DIAGNOSIS — I081 Rheumatic disorders of both mitral and tricuspid valves: Secondary | ICD-10-CM | POA: Diagnosis present

## 2017-03-04 DIAGNOSIS — I130 Hypertensive heart and chronic kidney disease with heart failure and stage 1 through stage 4 chronic kidney disease, or unspecified chronic kidney disease: Secondary | ICD-10-CM | POA: Diagnosis not present

## 2017-03-04 DIAGNOSIS — I998 Other disorder of circulatory system: Secondary | ICD-10-CM

## 2017-03-04 DIAGNOSIS — E039 Hypothyroidism, unspecified: Secondary | ICD-10-CM | POA: Diagnosis present

## 2017-03-04 DIAGNOSIS — N170 Acute kidney failure with tubular necrosis: Secondary | ICD-10-CM | POA: Diagnosis not present

## 2017-03-04 DIAGNOSIS — R7401 Elevation of levels of liver transaminase levels: Secondary | ICD-10-CM

## 2017-03-04 DIAGNOSIS — G629 Polyneuropathy, unspecified: Secondary | ICD-10-CM | POA: Diagnosis present

## 2017-03-04 DIAGNOSIS — T45515A Adverse effect of anticoagulants, initial encounter: Secondary | ICD-10-CM | POA: Diagnosis present

## 2017-03-04 DIAGNOSIS — Z66 Do not resuscitate: Secondary | ICD-10-CM | POA: Diagnosis not present

## 2017-03-04 DIAGNOSIS — E162 Hypoglycemia, unspecified: Secondary | ICD-10-CM | POA: Diagnosis not present

## 2017-03-04 DIAGNOSIS — R1110 Vomiting, unspecified: Secondary | ICD-10-CM

## 2017-03-04 DIAGNOSIS — D751 Secondary polycythemia: Secondary | ICD-10-CM | POA: Diagnosis present

## 2017-03-04 DIAGNOSIS — K8020 Calculus of gallbladder without cholecystitis without obstruction: Secondary | ICD-10-CM | POA: Diagnosis not present

## 2017-03-04 DIAGNOSIS — R6521 Severe sepsis with septic shock: Secondary | ICD-10-CM | POA: Diagnosis not present

## 2017-03-04 DIAGNOSIS — M1990 Unspecified osteoarthritis, unspecified site: Secondary | ICD-10-CM | POA: Diagnosis present

## 2017-03-04 DIAGNOSIS — D6832 Hemorrhagic disorder due to extrinsic circulating anticoagulants: Secondary | ICD-10-CM | POA: Diagnosis present

## 2017-03-04 DIAGNOSIS — R680 Hypothermia, not associated with low environmental temperature: Secondary | ICD-10-CM | POA: Diagnosis not present

## 2017-03-04 DIAGNOSIS — D72829 Elevated white blood cell count, unspecified: Secondary | ICD-10-CM | POA: Diagnosis not present

## 2017-03-04 DIAGNOSIS — Z9911 Dependence on respirator [ventilator] status: Secondary | ICD-10-CM

## 2017-03-04 DIAGNOSIS — Z90710 Acquired absence of both cervix and uterus: Secondary | ICD-10-CM

## 2017-03-04 DIAGNOSIS — I70221 Atherosclerosis of native arteries of extremities with rest pain, right leg: Secondary | ICD-10-CM | POA: Diagnosis not present

## 2017-03-04 DIAGNOSIS — K7200 Acute and subacute hepatic failure without coma: Secondary | ICD-10-CM | POA: Diagnosis not present

## 2017-03-04 DIAGNOSIS — R5381 Other malaise: Secondary | ICD-10-CM | POA: Diagnosis not present

## 2017-03-04 DIAGNOSIS — K3189 Other diseases of stomach and duodenum: Secondary | ICD-10-CM | POA: Diagnosis not present

## 2017-03-04 DIAGNOSIS — Z9861 Coronary angioplasty status: Secondary | ICD-10-CM

## 2017-03-04 DIAGNOSIS — Z6823 Body mass index (BMI) 23.0-23.9, adult: Secondary | ICD-10-CM

## 2017-03-04 DIAGNOSIS — Z978 Presence of other specified devices: Secondary | ICD-10-CM

## 2017-03-04 DIAGNOSIS — J9601 Acute respiratory failure with hypoxia: Secondary | ICD-10-CM

## 2017-03-04 DIAGNOSIS — M21379 Foot drop, unspecified foot: Secondary | ICD-10-CM | POA: Diagnosis not present

## 2017-03-04 DIAGNOSIS — Z01810 Encounter for preprocedural cardiovascular examination: Secondary | ICD-10-CM | POA: Diagnosis not present

## 2017-03-04 DIAGNOSIS — Z4682 Encounter for fitting and adjustment of non-vascular catheter: Secondary | ICD-10-CM | POA: Diagnosis not present

## 2017-03-04 DIAGNOSIS — D500 Iron deficiency anemia secondary to blood loss (chronic): Secondary | ICD-10-CM | POA: Diagnosis present

## 2017-03-04 DIAGNOSIS — I110 Hypertensive heart disease with heart failure: Secondary | ICD-10-CM | POA: Diagnosis not present

## 2017-03-04 DIAGNOSIS — I739 Peripheral vascular disease, unspecified: Secondary | ICD-10-CM | POA: Diagnosis not present

## 2017-03-04 DIAGNOSIS — I50811 Acute right heart failure: Secondary | ICD-10-CM | POA: Diagnosis not present

## 2017-03-04 DIAGNOSIS — D75839 Thrombocytosis, unspecified: Secondary | ICD-10-CM | POA: Diagnosis present

## 2017-03-04 DIAGNOSIS — Z86718 Personal history of other venous thrombosis and embolism: Secondary | ICD-10-CM

## 2017-03-04 DIAGNOSIS — I10 Essential (primary) hypertension: Secondary | ICD-10-CM | POA: Diagnosis not present

## 2017-03-04 DIAGNOSIS — J90 Pleural effusion, not elsewhere classified: Secondary | ICD-10-CM | POA: Diagnosis not present

## 2017-03-04 DIAGNOSIS — Z01818 Encounter for other preprocedural examination: Secondary | ICD-10-CM

## 2017-03-04 DIAGNOSIS — Z8249 Family history of ischemic heart disease and other diseases of the circulatory system: Secondary | ICD-10-CM

## 2017-03-04 DIAGNOSIS — Z4659 Encounter for fitting and adjustment of other gastrointestinal appliance and device: Secondary | ICD-10-CM

## 2017-03-04 DIAGNOSIS — I4891 Unspecified atrial fibrillation: Secondary | ICD-10-CM | POA: Diagnosis not present

## 2017-03-04 DIAGNOSIS — N183 Chronic kidney disease, stage 3 (moderate): Secondary | ICD-10-CM | POA: Diagnosis present

## 2017-03-04 DIAGNOSIS — I517 Cardiomegaly: Secondary | ICD-10-CM | POA: Diagnosis not present

## 2017-03-04 HISTORY — DX: Hyperlipidemia, unspecified: E78.5

## 2017-03-04 LAB — BASIC METABOLIC PANEL
Anion gap: 14 (ref 5–15)
BUN: 39 mg/dL — ABNORMAL HIGH (ref 6–20)
CO2: 26 mmol/L (ref 22–32)
Calcium: 8.7 mg/dL — ABNORMAL LOW (ref 8.9–10.3)
Chloride: 100 mmol/L — ABNORMAL LOW (ref 101–111)
Creatinine, Ser: 1.39 mg/dL — ABNORMAL HIGH (ref 0.44–1.00)
GFR calc Af Amer: 42 mL/min — ABNORMAL LOW (ref >60)
GFR, EST NON AFRICAN AMERICAN: 36 mL/min — AB (ref >60)
Glucose, Bld: 88 mg/dL (ref 65–99)
Potassium: 3.1 mmol/L — ABNORMAL LOW (ref 3.5–5.1)
SODIUM: 140 mmol/L (ref 135–145)

## 2017-03-04 LAB — CBC WITH DIFFERENTIAL/PLATELET
BLASTS: 0 %
Band Neutrophils: 0 %
Basophils Absolute: 0.5 10*3/uL — ABNORMAL HIGH (ref 0.0–0.1)
Basophils Relative: 1 %
Eosinophils Absolute: 0.5 10*3/uL (ref 0.0–0.7)
Eosinophils Relative: 1 %
HCT: 35.8 % — ABNORMAL LOW (ref 36.0–46.0)
Hemoglobin: 10 g/dL — ABNORMAL LOW (ref 12.0–15.0)
LYMPHS PCT: 6 %
Lymphs Abs: 2.8 10*3/uL (ref 0.7–4.0)
MCH: 21.5 pg — AB (ref 26.0–34.0)
MCHC: 27.9 g/dL — ABNORMAL LOW (ref 30.0–36.0)
MCV: 77 fL — AB (ref 78.0–100.0)
MONOS PCT: 2 %
Metamyelocytes Relative: 0 %
Monocytes Absolute: 0.9 10*3/uL (ref 0.1–1.0)
Myelocytes: 0 %
NEUTROS ABS: 41.9 10*3/uL — AB (ref 1.7–7.7)
Neutrophils Relative %: 90 %
OTHER: 0 %
PLATELETS: 1620 10*3/uL — AB (ref 150–400)
Promyelocytes Absolute: 0 %
RBC: 4.65 MIL/uL (ref 3.87–5.11)
RDW: 25.9 % — AB (ref 11.5–15.5)
WBC: 46.6 10*3/uL — AB (ref 4.0–10.5)
nRBC: 0 /100 WBC

## 2017-03-04 LAB — PROTIME-INR
INR: 3.74
Prothrombin Time: 37.9 seconds — ABNORMAL HIGH (ref 11.4–15.2)

## 2017-03-04 MED ORDER — MIDODRINE HCL 5 MG PO TABS
10.0000 mg | ORAL_TABLET | Freq: Once | ORAL | Status: DC
  Filled 2017-03-04: qty 2

## 2017-03-04 MED ORDER — HEPARIN (PORCINE) IN NACL 100-0.45 UNIT/ML-% IJ SOLN
12.0000 [IU]/kg/h | INTRAMUSCULAR | Status: DC
  Administered 2017-03-04: 12 [IU]/kg/h via INTRAVENOUS
  Filled 2017-03-04: qty 250

## 2017-03-04 MED ORDER — MORPHINE SULFATE (PF) 4 MG/ML IV SOLN
4.0000 mg | Freq: Once | INTRAVENOUS | Status: AC
  Administered 2017-03-04: 4 mg via INTRAVENOUS
  Filled 2017-03-04: qty 1

## 2017-03-04 MED ORDER — MORPHINE SULFATE (PF) 4 MG/ML IV SOLN
4.0000 mg | INTRAVENOUS | Status: DC | PRN
  Administered 2017-03-05: 4 mg via INTRAVENOUS
  Filled 2017-03-04: qty 1

## 2017-03-04 NOTE — ED Notes (Signed)
Date and time results received: 03/04/17 2253 (use smartphrase ".now" to insert current time)  Test: platelets Critical Value: 1620  Name of Provider Notified: dr long  Orders Received? Or Actions Taken?: Actions Taken: no orders received.

## 2017-03-04 NOTE — Consult Note (Signed)
[facility] [place]  Patient name: Charlotte [name] MRN: [id number] DOB: 1941-02-24 Sex: female   REQUESTING PROVIDER:    ER   REASON FOR CONSULT:    Leg pain for several days  HISTORY OF PRESENT ILLNESS:   Charlotte [name] is a 76 y.o. female, who has a history of a right femoral endarterectomy and femoral to above knee popliteal bypass with 1mm Gortex for claudication by Dr. Kellie Simmering in 2014.  She was last seen by Dr. Donnetta Hutching in January of 2018.  At that time she came with an ultrasound that revealed an occluded bbypass.  She had no idea when her bypass occluded.  She did not have claudication, as her walking was limited by her pulmonary status. Her ABI at that time was 0.7.  Her ABI prior to her bypass surgery was 0.54.  She has a history of peripheral neuropathy as well as leg swelling.  She was diagnosed with venous hypertension.  She had been treated for ulceration, however with compression and wound care, her wounds have healed.  Since about Sunday, she has been complaining of worsening pain in her right leg which is somewhat discolored.  She caem to the [facility] for evaluation.  SHe has a history of AFIB, on coumadin.  She is followed by Dr. Bronson Ing for acute on chronic diastolic heart failure with an EF of 60% in 2017.  She is medically managed for hypertension.  She is a non smoker, but is on home O2.  She also has polycythemia vera.She is very hard of hearing  PAST MEDICAL HISTORY    Past Medical History:  Diagnosis Date  . Arthritis   . Atrial fibrillation ([place])   . Atrial fibrillation, chronic ([place])   . CHF (congestive heart failure) ([place])   . Deaf   . Diastolic heart failure ([place])   . DVT (deep venous thrombosis) ([place])   . Essential hypertension   . Hypothyroidism   . On home O2   . Peripheral neuropathy   . Peripheral vascular disease ([place])   . 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 Ulcer of ankle  ([place])    Bilateral 2015  . Varicose veins      FAMILY HISTORY   Family History  Problem Relation Age of Onset  . Heart failure Mother   . Heart disease Mother   . Stroke Father   . Heart disease Sister   . Diabetes Son   . Hypertension Sister   . Hypertension Sister   . Stroke Sister     SOCIAL HISTORY:   Social History   Social History  . Marital status: Married    Spouse name: N/A  . Number of children: N/A  . Years of education: N/A   Occupational History  . Not on file.   Social History Main Topics  . Smoking status: Never Smoker  . Smokeless tobacco: Never Used  . Alcohol use No  . Drug use: No  . Sexual activity: Not Currently   Other Topics Concern  . Not on file   Social History Narrative  . No narrative on file    ALLERGIES:    No Known Allergies  CURRENT MEDICATIONS:    Current Facility-Administered Medications  Medication Dose Route Frequency Provider Last Rate Last Dose  . morphine 4 MG/ML injection 4 mg  4 mg Intravenous Once Long, [place]onda [place], MD       Current Outpatient Prescriptions  Medication Sig Dispense Refill  .  aspirin EC 81 MG tablet Take 81 mg by mouth daily.    . benzonatate (TESSALON) 100 MG capsule Take 1 capsule (100 mg total) by mouth 3 (three) times daily as needed for cough. 20 capsule 0  . CARTIA XT 240 MG 24 hr capsule TAKE ONE CAPSULE BY MOUTH ONCE DAILY 90 capsule 3  . gabapentin (NEURONTIN) 100 MG capsule Take 100 mg by mouth 3 (three) times daily.      Marland Kitchen guaiFENesin (MUCINEX) 600 MG 12 hr tablet Take 1 tablet (600 mg total) by mouth 2 (two) times daily. 30 tablet 0  . hydroxyurea (HYDREA) 500 MG capsule Take 2 capsules (1,000 mg total) by mouth daily. May take with food to minimize GI side effects. 60 capsule 2  . levothyroxine (SYNTHROID, LEVOTHROID) 75 MCG tablet Take 75 mcg by mouth daily before breakfast.     . metolazone (ZAROXOLYN) 2.5 MG tablet Take 2.5 mg if wt gain is 3 lbs or more in 24 hrs (Patient taking  differently: Take 2.5 mg by mouth daily as needed (for fluid). Take 2.5 mg if wt gain is 3 lbs or more in 24 hrs) 30 tablet 3  . metoprolol (LOPRESSOR) 100 MG tablet Take 100 mg by mouth 2 (two) times daily.    . Potassium Chloride ER 20 MEQ TBCR Take 20 mEq by mouth daily. 30 tablet 1  . pravastatin (PRAVACHOL) 20 MG tablet Take 10 mg by mouth daily.     Marland Kitchen torsemide (DEMADEX) 20 MG tablet Take 2 tablets (40 mg total) by mouth daily. 180 tablet 3  . warfarin (COUMADIN) 5 MG tablet Take 1 tablet daily except 1/2 tablet on Mondays, Wednesdays and Fridays (Patient taking differently: Take 2.5-5 mg by mouth See admin instructions. Take 1 tablet daily except 1/2 tablet on Mondays, Wednesdays, Fridays, and Saturdays) 90 tablet 4    REVIEW OF SYSTEMS:   [X]  denotes positive finding, [ ]  denotes negative finding Cardiac  Comments:  Chest pain or chest pressure:    Shortness of breath upon exertion:    Short of breath when lying flat:    Irregular heart rhythm:        Vascular    Pain in calf, thigh, or hip brought on by ambulation: x   Pain in feet at night that wakes you up from your sleep:  x   Blood clot in your veins:    Leg swelling:         Pulmonary    Oxygen at home:    Productive cough:     Wheezing:         Neurologic    Sudden weakness in arms or legs:     Sudden numbness in arms or legs:     Sudden onset of difficulty speaking or slurred speech:    Temporary loss of vision in one eye:     Problems with dizziness:         Gastrointestinal    Blood in stool:      Vomited blood:         Genitourinary    Burning when urinating:     Blood in urine:        Psychiatric    Major depression:         Hematologic    Bleeding problems:    Problems with blood clotting too easily:        Skin    Rashes or ulcers:        Constitutional  Fever or chills:     PHYSICAL EXAM:   Vitals:   03/04/17 2111  BP: 117/71  Pulse: 89  Resp: (!) 22  Temp: 97.8 F (36.6 C)    TempSrc: Oral  SpO2: 96%  Weight: 154 lb (69.9 kg)    GENERAL: The patient is a well-nourished female, in no acute distress. The vital signs are documented above. CARDIAC: There is a regular rate and rhythm.  VASCULAR: Faint dorsalis pedis Doppler signal on the right leg PULMONARY: Nonlabored respirations ABDOMEN: Soft and non-tender with normal pitched bowel sounds.  MUSCULOSKELETAL: There are no major deformities or cyanosis. NEUROLOGIC: Chronic sensation loss in the right foot.  Motor function appears intact. SKIN: There are no ulcers or rashes noted. PSYCHIATRIC: The patient has a normal affect.  STUDIES:   I have reviewed her old angiogram where she had right femoral artery occlusion, along with SFA occlusion with reconstitution of the distal SFA.  Dr. Evelena Leyden op-note states that he took care not to damage the saphenous vein  Old venous u/s show a DVT in the femoral and popliteal veins  ASSESSMENT and PLAN   Rest pain, right leg: The patient is going to need an arteriogram to define her anatomy.  Her bypass graft is known to be occluded.  I would like to do for arteriogram Friday, however she is on Coumadin with a supratherapeutic INR.  I would like to try and correct her INR today with FFP so that she can have an arteriogram tomorrow.  All attempts at percutaneous revascularization will be tried tomorrow at angiography.  She will need to be nothing by mouth after midnight.   Annamarie Major, MD [facility] [phone] Pager [phone]

## 2017-03-04 NOTE — ED Provider Notes (Signed)
Emergency Department Provider Note   I have reviewed the triage vital signs and the nursing notes.   HISTORY  Chief Complaint Leg Pain   HPI Charlotte [name] is a 76 y.o. female with PMH of a-fib on Coumadin, CHF, PVD with chronic occluded fem-pop bypass graft last followed by Dr. Donnetta Hutching in 08/2016 presents to the ED with acute onset right foot/lower leg pain with color change starting 3 days prior. The patient reports pain much worse than her typical neuropathy symptoms. She was told that she has a 70% blockage in the right leg and is concerned that this has gotten worse. She denies significant lower extremity swelling. She denies any abdominal pain, chest pain, difficulty breathing. Patient is not very ambulatory at baseline. No significant modifying factors for the pain. She describes it as severe.   Past Medical History:  Diagnosis Date  . Arthritis   . Atrial fibrillation ([place])   . Atrial fibrillation, chronic ([place])   . CHF (congestive heart failure) ([place])   . Deaf   . Diastolic heart failure ([place])   . DVT (deep venous thrombosis) ([place])   . Essential hypertension   . Hypothyroidism   . On home O2   . Peripheral neuropathy   . Peripheral vascular disease ([place])   . 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 Ulcer of ankle ([place])    Bilateral 2015  . Varicose veins     Patient Active Problem List   Diagnosis Date Noted  . Chronic respiratory failure with hypoxia ([place]) 02/12/2017  . CAP (community acquired pneumonia) 02/12/2017  . Cellulitis 07/03/2016  . Anemia 07/03/2016  . Cough 11/29/2015  . Hypoxemia 11/29/2015  . Community acquired pneumonia 11/29/2015  . CHF (congestive heart failure) ([place]) 11/29/2015  . Acute on chronic respiratory failure with hypoxia ([place])   . Pleural effusion on left   . Acute on chronic diastolic (congestive) heart failure ([place])   . Dyspnea 11/22/2014  . Pleural effusion 11/22/2014  . Hard of hearing 11/22/2014  . Thrombocytosis ([place])  11/22/2014  . Encounter for therapeutic drug monitoring 08/31/2013  . Aftercare following surgery of the circulatory system, [place] 12/21/2012  . Pain in limb 10/19/2012  . Peripheral vascular disease ([place]) 09/24/2012  . Swollen leg 09/24/2012  . Varicose veins of lower extremities with other complications 35/36/1443  . 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 Wound infection after surgery 01/07/2012  . Compartment syndrome, nontraumatic, lower extremity 11/29/2011  . Pain of right thigh 11/28/2011  . Hip fracture, right ([place]) 11/15/2011  . Chronic atrial fibrillation ([place]) 11/15/2011  . Anticoagulant Amanda Pote-term use 11/15/2011  . HTN (hypertension) 11/15/2011  . Hypothyroidism 11/15/2011  . Polycythemia vera ([place]) 10/01/2011    Past Surgical History:  Procedure Laterality Date  . ABDOMINAL AORTAGRAM N/A 09/14/2012   Procedure: ABDOMINAL [name];  Surgeon: Serafina Mitchell, MD;  Location[facility] CATH LAB;  Service: Cardiovascular;  Laterality: N/A;  . ABDOMINAL HYSTERECTOMY    . CORONARY ANGIOPLASTY    . DRESSING CHANGE UNDER ANESTHESIA  12/02/2011   Procedure: DRESSING CHANGE UNDER ANESTHESIA;  Surgeon: Carole Civil, MD;  Location: [facility] OR[facility];  Service: Orthopedics;  Laterality: Right;  . ENDARTERECTOMY FEMORAL Right 09/15/2012   Procedure: ENDARTERECTOMY FEMORAL;  Surgeon: Mal Misty, MD;  Location: [facility];  Service: Vascular;  Laterality: Right;  . FASCIOTOMY  11/29/2011   Procedure: FASCIOTOMY;  Surgeon: Carole Civil, MD;  Location: [facility];  Service: Orthopedics;  Laterality: Right;  right thigh   . FEMORAL-POPLITEAL BYPASS GRAFT Right 09/15/2012   Procedure: BYPASS GRAFT FEMORAL-POPLITEAL ARTERY;  Surgeon: Mal Misty, MD;  Location: [facility];  Service: Vascular;  Laterality: Right;  . HIP PINNING,CANNULATED  11/19/2011   Procedure: CANNULATED HIP PINNING;  Surgeon: Sanjuana Kava, MD;   Location: [facility];  Service: Orthopedics;  Laterality: Right;  . PATCH ANGIOPLASTY Right 09/15/2012   Procedure: PATCH ANGIOPLASTY;  Surgeon: Mal Misty, MD;  Location: [facility];  Service: Vascular;  Laterality: Right;  . TUBAL LIGATION      Current Outpatient Rx  . Order #: [id number] Class: Historical Med  . Order #: [id number] Class: Normal  . Order #: [id number] Class: Historical Med  . Order #: [id number] Class: Normal  . Order #: [id number] Class: Normal  . Order #: [id number] Class: Historical Med  . Order #: [id number] Class: Normal  . Order #: [id number] Class: Historical Med  . Order #: [id number] Class: Print  . Order #: [id number] Class: Historical Med  . Order #: [id number] Class: Normal  . Order #: [id number] Class: Fax  . Order #: [id number] Class: Normal    Allergies Patient has no known allergies.  Family History  Problem Relation Age of Onset  . Heart failure Mother   . Heart disease Mother   . Stroke Father   . Heart disease Sister   . Diabetes Son   . Hypertension Sister   . Hypertension Sister   . Stroke Sister     Social History Social History  Substance Use Topics  . Smoking status: Never Smoker  . Smokeless tobacco: Never Used  . Alcohol use No    Review of Systems  Constitutional: No fever/chills Eyes: No visual changes. ENT: No sore throat. Cardiovascular: Denies chest pain. Respiratory: Denies shortness of breath. Gastrointestinal: No abdominal pain.  No nausea, no vomiting.  No diarrhea.  No constipation. Genitourinary: Negative for dysuria. Musculoskeletal: Negative for back pain. Positive right leg/foot pain and discoloration.  Skin: Negative for rash. Neurological: Negative for headaches, focal weakness or numbness.  10-point ROS otherwise negative.  ____________________________________________   PHYSICAL EXAM:  VITAL SIGNS: ED Triage Vitals  Enc Vitals Group     BP 03/04/17 2111 117/71     Pulse Rate 03/04/17 2111 89     Resp 03/04/17 2111 (!)  22     Temp 03/04/17 2111 97.8 F (36.6 C)     Temp Source 03/04/17 2111 Oral     SpO2 03/04/17 2111 96 %     Weight 03/04/17 2111 154 lb (69.9 kg)     Pain Score 03/04/17 2124 5   Constitutional: Alert and oriented. Chronically ill-appearing but in no acute distress.  Eyes: Conjunctivae are normal.  Head: Atraumatic. Nose: No congestion/rhinnorhea. [place] O2 in place per patient baseline.  Mouth/Throat: Mucous membranes are moist. Neck: No stridor.  Cardiovascular: Normal rate, regular rhythm. Grossly normal heart sounds.   Respiratory: Normal respiratory effort.  No retractions. Lungs CTAB. Gastrointestinal: Soft and nontender. No distention.  Musculoskeletal: No lower extremity edema with decreased movement in the RLE (toes) but still able to move slightly. No palpable or dopplerable pulses in the RLE. Normal DP and PT in the LLE.  Neurologic:  Normal speech and language. No gross focal neurologic deficits are appreciated.  Skin: Cool right foot with blueish discoloration. No open wound or ulceration.   ____________________________________________   LABS (all labs ordered are listed, but only abnormal results are displayed)  Labs Reviewed  BASIC METABOLIC  PANEL - Abnormal; Notable for the following:       Result Value   Potassium 3.1 (*)    Chloride 100 (*)    BUN 39 (*)    Creatinine, Ser 1.39 (*)    Calcium 8.7 (*)    GFR calc non Af Amer 36 (*)    GFR calc Af Amer 42 (*)    All other components within normal limits  CBC WITH DIFFERENTIAL/PLATELET - Abnormal; Notable for the following:    WBC 46.6 (*)    Hemoglobin 10.0 (*)    HCT 35.8 (*)    MCV 77.0 (*)    MCH 21.5 (*)    MCHC 27.9 (*)    RDW 25.9 (*)    Platelets 1,620 (*)    All other components within normal limits  PROTIME-INR - Abnormal; Notable for the following:    Prothrombin Time 37.9 (*)    All other components within normal limits  HEPARIN LEVEL (UNFRACTIONATED)  CBC  PATHOLOGIST SMEAR REVIEW    ____________________________________________  RADIOLOGY  None ____________________________________________   PROCEDURES  Procedure(s) performed:   Procedures  CRITICAL CARE Performed by: Margette Fast Total critical care time: 45 minutes Critical care time was exclusive of separately billable procedures and treating other patients. Critical care was necessary to treat or prevent imminent or life-threatening deterioration. Critical care was time spent personally by me on the following activities: development of treatment plan with patient and/or surrogate as well as nursing, discussions with consultants, evaluation of patient's response to treatment, examination of patient, obtaining history from patient or surrogate, ordering and performing treatments and interventions, ordering and review of laboratory studies, ordering and review of radiographic studies, pulse oximetry and re-evaluation of patient's condition.  Nanda Quinton, MD Emergency Medicine  ____________________________________________   INITIAL IMPRESSION / ASSESSMENT AND PLAN / ED COURSE  Pertinent labs & imaging results that were available during my care of the patient were reviewed by me and considered in my medical decision making (see chart for details).  Patient presents to the emergency department for evaluation of right leg/foot pain with positive color change. Symptoms began 3 days ago. Symptoms are severe and at rest. Patient not particularly active. The patient's right foot and lower calf are blue in color with no palpable or dopplerable pulse at bedside. Foot is cool to touch. Review of note from Dr. Donnetta Hutching in January shows an occluded superficial femoral artery as well as graft occlusion in the same leg. Plan for labs, pain control, Heparin, and emergent vascular surgery consult with concern for acute limb ischemia.   09:43 PM Spoke with Dr. Trula Slade with Vascular surgery. Suspects that the patient will need  arterial studies to decide on re-vascularization options with worsening of known chronic occlusion. Plan to start Heparin and he will see in the AM at [facility] No need to call when she arrives. Keep NPO after midnight in case coags are normal and angiography can happen tomorrow. Otherwise, patient may have angio day after. Requests Hospitalist admission to [facility] given her complex PMH and potential lack of vascular surgery options.   Discussed patient's case with Hospitalist, Dr. Olevia Bowens. Patient and family (if present) updated with plan. Care transferred to Hospitalist service.  I reviewed all nursing notes, vitals, pertinent old records, EKGs, labs, imaging (as available).  ____________________________________________  FINAL CLINICAL IMPRESSION(S) / ED DIAGNOSES  Final diagnoses:  Right leg pain  Lower limb ischemia     MEDICATIONS GIVEN DURING THIS VISIT:  Medications  heparin ADULT infusion 100 units/mL (25000 units/277mL sodium chloride 0.45%) (12 Units/kg/hr  69.9 kg Intravenous New Bag/Given 03/04/17 2227)  morphine 4 MG/ML injection 4 mg (4 mg Intravenous Given 03/04/17 2202)     NEW OUTPATIENT MEDICATIONS STARTED DURING THIS VISIT:  None   Note:  This document was prepared using Dragon voice recognition software and may include unintentional dictation errors.  Nanda Quinton, MD Emergency Medicine    Yashar Inclan, [facility], MD 03/04/17 [phone]

## 2017-03-04 NOTE — Progress Notes (Signed)
ANTICOAGULATION CONSULT NOTE - Initial Consult  Pharmacy Consult for heparin Indication: pulseless foot  No Known Allergies  Patient Measurements: Weight: 154 lb (69.9 kg) Heparin Dosing Weight: 69.9 kg  Vital Signs: Temp: 97.8 F (36.6 C) (08/01 2111) Temp Source: Oral (08/01 2111) BP: 117/71 (08/01 2111) Pulse Rate: 89 (08/01 2111)  Labs:  Recent Labs  03/03/17 1039  HGB 10.6*  HCT 37.9  PLT 1,234*  CREATININE 1.56*    Estimated Creatinine Clearance: 34.9 mL/min (A) (by C-G formula based on SCr of 1.56 mg/dL (H)).   Medical History: Past Medical History:  Diagnosis Date  . Arthritis   . Atrial fibrillation ([place])   . Atrial fibrillation, chronic ([place])   . CHF (congestive heart failure) ([employer])   . Deaf   . Diastolic heart failure ([place])   . DVT (deep venous thrombosis) ([place])   . Essential hypertension   . Hypothyroidism   . On home O2   . Peripheral neuropathy   . Peripheral vascular disease ([place])   . 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 Ulcer of ankle ([place])    Bilateral 2015  . Varicose veins     Medications:  See medication history  Assessment: 76 yo lady who presents with foot pain and discoloration.  She reports 70% blockage in right leg and has no dopplerable pulse in right foot.  She is on coumadin at home, INR is pending.  Given potential severity of occlusion will start heparin now with no bolus at lower dose.Plan to transfer to [facility] for evaluation. Goal of Therapy:  Heparin level 0.3-0.7 units/ml Monitor platelets by anticoagulation protocol: Yes   Plan:  Heparin drip at 12 units/kg/hr (850 units/hr) Check heparin level and CBC daily while on heparin  Mayjor Ager [place] 03/04/2017,10:15 PM

## 2017-03-04 NOTE — ED Triage Notes (Addendum)
Pt c/o right leg pain and discoloration  that became worse Sunday, pt reports that she has history of 70% blockage in her right leg, unable to palpate pedal pulse in triage,

## 2017-03-05 ENCOUNTER — Encounter (HOSPITAL_COMMUNITY): Payer: Self-pay | Admitting: Internal Medicine

## 2017-03-05 DIAGNOSIS — E785 Hyperlipidemia, unspecified: Secondary | ICD-10-CM | POA: Diagnosis present

## 2017-03-05 LAB — CBC WITH DIFFERENTIAL/PLATELET
Band Neutrophils: 0 %
Basophils Absolute: 2.2 10*3/uL — ABNORMAL HIGH (ref 0.0–0.1)
Basophils Relative: 5 %
Blasts: 0 %
Eosinophils Absolute: 0.9 10*3/uL — ABNORMAL HIGH (ref 0.0–0.7)
Eosinophils Relative: 2 %
HCT: 33.1 % — ABNORMAL LOW (ref 36.0–46.0)
Hemoglobin: 9.1 g/dL — ABNORMAL LOW (ref 12.0–15.0)
Lymphocytes Relative: 4 %
Lymphs Abs: 1.8 10*3/uL (ref 0.7–4.0)
MCH: 20.9 pg — ABNORMAL LOW (ref 26.0–34.0)
MCHC: 27.5 g/dL — ABNORMAL LOW (ref 30.0–36.0)
MCV: 75.9 fL — ABNORMAL LOW (ref 78.0–100.0)
Metamyelocytes Relative: 0 %
Monocytes Absolute: 0.9 10*3/uL (ref 0.1–1.0)
Monocytes Relative: 2 %
Myelocytes: 0 %
Neutro Abs: 39 10*3/uL — ABNORMAL HIGH (ref 1.7–7.7)
Neutrophils Relative %: 87 %
Platelets: 1392 10*3/uL (ref 150–400)
Promyelocytes Absolute: 0 %
RBC: 4.36 MIL/uL (ref 3.87–5.11)
RDW: 25.3 % — ABNORMAL HIGH (ref 11.5–15.5)
WBC: 44.8 10*3/uL — ABNORMAL HIGH (ref 4.0–10.5)
nRBC: 0 /100 WBC

## 2017-03-05 LAB — PATHOLOGIST SMEAR REVIEW

## 2017-03-05 LAB — COMPREHENSIVE METABOLIC PANEL
ALBUMIN: 2.8 g/dL — AB (ref 3.5–5.0)
ALK PHOS: 126 U/L (ref 38–126)
ALT: 11 U/L — ABNORMAL LOW (ref 14–54)
AST: 26 U/L (ref 15–41)
Anion gap: 9 (ref 5–15)
BILIRUBIN TOTAL: 1 mg/dL (ref 0.3–1.2)
BUN: 35 mg/dL — AB (ref 6–20)
CALCIUM: 8.2 mg/dL — AB (ref 8.9–10.3)
CO2: 29 mmol/L (ref 22–32)
Chloride: 99 mmol/L — ABNORMAL LOW (ref 101–111)
Creatinine, Ser: 1.55 mg/dL — ABNORMAL HIGH (ref 0.44–1.00)
GFR calc Af Amer: 37 mL/min — ABNORMAL LOW (ref >60)
GFR, EST NON AFRICAN AMERICAN: 32 mL/min — AB (ref >60)
GLUCOSE: 108 mg/dL — AB (ref 65–99)
Potassium: 2.8 mmol/L — ABNORMAL LOW (ref 3.5–5.1)
Sodium: 137 mmol/L (ref 135–145)
TOTAL PROTEIN: 5.9 g/dL — AB (ref 6.5–8.1)

## 2017-03-05 LAB — PROTIME-INR
INR: 3.93
Prothrombin Time: 39.5 seconds — ABNORMAL HIGH (ref 11.4–15.2)

## 2017-03-05 LAB — MAGNESIUM: Magnesium: 2.3 mg/dL (ref 1.7–2.4)

## 2017-03-05 MED ORDER — PANTOPRAZOLE SODIUM 40 MG PO TBEC
40.0000 mg | DELAYED_RELEASE_TABLET | Freq: Every day | ORAL | Status: DC
  Administered 2017-03-05 – 2017-03-10 (×6): 40 mg via ORAL
  Filled 2017-03-05 (×7): qty 1

## 2017-03-05 MED ORDER — METOLAZONE 2.5 MG PO TABS: 2.5000 mg | ORAL_TABLET | Freq: Every day | ORAL | Status: DC | PRN

## 2017-03-05 MED ORDER — ONDANSETRON HCL 4 MG PO TABS
4.0000 mg | ORAL_TABLET | Freq: Four times a day (QID) | ORAL | Status: DC | PRN
  Administered 2017-03-05: 4 mg via ORAL
  Filled 2017-03-05: qty 1

## 2017-03-05 MED ORDER — METOPROLOL TARTRATE 100 MG PO TABS
100.0000 mg | ORAL_TABLET | Freq: Two times a day (BID) | ORAL | Status: DC
  Administered 2017-03-05: 100 mg via ORAL
  Filled 2017-03-05: qty 1

## 2017-03-05 MED ORDER — VITAMIN K1 10 MG/ML IJ SOLN
2.5000 mg | Freq: Once | INTRAVENOUS | Status: AC
  Administered 2017-03-05: 2.5 mg via INTRAVENOUS
  Filled 2017-03-05: qty 0.25

## 2017-03-05 MED ORDER — TORSEMIDE 20 MG PO TABS
40.0000 mg | ORAL_TABLET | Freq: Every day | ORAL | Status: DC
  Administered 2017-03-05: 40 mg via ORAL
  Filled 2017-03-05: qty 2

## 2017-03-05 MED ORDER — HYDROXYUREA 500 MG PO CAPS
1000.0000 mg | ORAL_CAPSULE | Freq: Every day | ORAL | Status: DC
  Administered 2017-03-05 – 2017-03-10 (×6): 1000 mg via ORAL
  Filled 2017-03-05 (×8): qty 2

## 2017-03-05 MED ORDER — PRAVASTATIN SODIUM 20 MG PO TABS
10.0000 mg | ORAL_TABLET | Freq: Every day | ORAL | Status: DC
  Administered 2017-03-05 – 2017-03-10 (×6): 10 mg via ORAL
  Filled 2017-03-05 (×7): qty 1

## 2017-03-05 MED ORDER — HYDROMORPHONE HCL 1 MG/ML IJ SOLN: 1.0000 mg | Freq: Once | INTRAMUSCULAR | Status: DC

## 2017-03-05 MED ORDER — DILTIAZEM HCL ER COATED BEADS 240 MG PO CP24
240.0000 mg | ORAL_CAPSULE | Freq: Every day | ORAL | Status: DC
  Administered 2017-03-05: 240 mg via ORAL
  Filled 2017-03-05: qty 1

## 2017-03-05 MED ORDER — GABAPENTIN 100 MG PO CAPS
100.0000 mg | ORAL_CAPSULE | Freq: Three times a day (TID) | ORAL | Status: DC
  Administered 2017-03-05 – 2017-03-10 (×18): 100 mg via ORAL
  Filled 2017-03-05 (×19): qty 1

## 2017-03-05 MED ORDER — SODIUM CHLORIDE 0.9 % IV SOLN
INTRAVENOUS | Status: DC
  Administered 2017-03-05 – 2017-03-06 (×3): via INTRAVENOUS

## 2017-03-05 MED ORDER — POTASSIUM CHLORIDE CRYS ER 20 MEQ PO TBCR
40.0000 meq | EXTENDED_RELEASE_TABLET | Freq: Once | ORAL | Status: AC
  Administered 2017-03-05: 40 meq via ORAL
  Filled 2017-03-05: qty 2

## 2017-03-05 MED ORDER — LEVOTHYROXINE SODIUM 75 MCG PO TABS
75.0000 ug | ORAL_TABLET | Freq: Every day | ORAL | Status: DC
  Administered 2017-03-05 – 2017-03-11 (×7): 75 ug via ORAL
  Filled 2017-03-05 (×7): qty 1

## 2017-03-05 MED ORDER — DILTIAZEM HCL 60 MG PO TABS
60.0000 mg | ORAL_TABLET | Freq: Four times a day (QID) | ORAL | Status: DC
  Administered 2017-03-05 – 2017-03-11 (×22): 60 mg via ORAL
  Filled 2017-03-05 (×22): qty 1

## 2017-03-05 MED ORDER — ASPIRIN EC 81 MG PO TBEC
81.0000 mg | DELAYED_RELEASE_TABLET | Freq: Every day | ORAL | Status: DC
Start: 2017-03-05 — End: 2017-03-12
  Administered 2017-03-05 – 2017-03-10 (×6): 81 mg via ORAL
  Filled 2017-03-05 (×7): qty 1

## 2017-03-05 MED ORDER — POTASSIUM CHLORIDE CRYS ER 20 MEQ PO TBCR
20.0000 meq | EXTENDED_RELEASE_TABLET | Freq: Every day | ORAL | Status: DC
  Administered 2017-03-05 – 2017-03-09 (×5): 20 meq via ORAL
  Filled 2017-03-05 (×5): qty 1

## 2017-03-05 MED ORDER — METOPROLOL TARTRATE 50 MG PO TABS
50.0000 mg | ORAL_TABLET | Freq: Two times a day (BID) | ORAL | Status: DC
  Administered 2017-03-05 – 2017-03-10 (×8): 50 mg via ORAL
  Filled 2017-03-05 (×8): qty 1

## 2017-03-05 MED ORDER — OXYCODONE HCL 5 MG PO TABS
5.0000 mg | ORAL_TABLET | Freq: Four times a day (QID) | ORAL | Status: AC | PRN
  Administered 2017-03-05 – 2017-03-06 (×3): 5 mg via ORAL
  Filled 2017-03-05 (×2): qty 1

## 2017-03-05 MED ORDER — GUAIFENESIN ER 600 MG PO TB12
600.0000 mg | ORAL_TABLET | Freq: Two times a day (BID) | ORAL | Status: DC
  Administered 2017-03-05 – 2017-03-10 (×12): 600 mg via ORAL
  Filled 2017-03-05 (×13): qty 1

## 2017-03-05 MED ORDER — ONDANSETRON HCL 4 MG/2ML IJ SOLN
4.0000 mg | Freq: Four times a day (QID) | INTRAMUSCULAR | Status: DC | PRN
  Administered 2017-03-05 – 2017-03-07 (×2): 4 mg via INTRAVENOUS
  Filled 2017-03-05: qty 2

## 2017-03-05 NOTE — H&P (Signed)
History and Physical    Charlotte [name]:[id number] DOB: [birth year] DOA: 03/04/2017  PCP: [name], MD   Patient coming from: Home.  I have personally briefly reviewed patient's old medical records in [place]  Chief Complaint: Right lower extremity pain and discoloration.  HPI: Charlotte [name] is a 76 y.o. female with medical history significant of osteoarthritis, chronic atrial fibrillation, diastolic heart failure on home oxygen, impaired hearing, history of DVT, essential hypertension, hyperlipidemia, hypothyroidism, peripheral neuropathy, polycythemia vera, history of severe peripheral vascular disease with a history of right femoral endarterectomy and femoral to above knee popliteal bypass in 2014, who was followed in early January this year by vascular surgery and at that time ultrasound revealed that her bypass was occluded. She denies history of claudication at that time, but has has a history of lower extremity wounds and ulcerations. She mentions that since Sunday her right lower extremity pain has increased and it looks discolored when compared to the left lower extremity. She denies fever, chills, but feels fatigue. Denies headache, sore throat, worsening chronic dyspnea, productive cough, chest pain, palpitations, dizziness, diaphoresis, lower extremity edema, PND or orthopnea. She denies abdominal pain, nausea, emesis, diarrhea, constipation, melena, hematochezia, dysuria, frequency or hematuria. She denies polyuria, polydipsia or blurred vision.  ED Course: Initial vital signs in the emergency department temperature 97.69F, pulse 89, respirations 22, blood pressure 117/71 mmHg and O2 sat 96% on nasal cannula oxygen. Her WBC was 46.6 with 90% neutrophils which is consistent with previous values. Her hemoglobin was 10.0 g/dL and platelets 1620. PTT 37.9 and INR 3.74. Her BMP shows a potassium of 3.1 mmol/L, but other electrolytes levels are normal. BUN was 39 and  creatinine 1.39 and glucose 100 mg/dL. Her creatinine level in the past 15 months has ranged from 0.87 in April last year up to 1.56 mg/dL 2 days ago.  Vascular surgery was consulted by Dr. Laverta Baltimore who recommended starting heparin infusion and transferred to [facility] for further evaluation.  Review of Systems: As per HPI otherwise 10 point review of systems negative.    Past Medical History:  Diagnosis Date  . Arthritis   . Atrial fibrillation ([place])   . Atrial fibrillation, chronic ([place])   . CHF (congestive heart failure) ([place])   . Deaf   . Diastolic heart failure ([place])   . DVT (deep venous thrombosis) ([place])   . Essential hypertension   . Hypothyroidism   . On home O2   . Peripheral neuropathy   . Peripheral vascular disease ([place])   . 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 Ulcer of ankle ([place])    Bilateral 2015  . Varicose veins     Past Surgical History:  Procedure Laterality Date  . ABDOMINAL AORTAGRAM N/A 09/14/2012   Procedure: ABDOMINAL [name];  Surgeon: Serafina Mitchell, MD;  Location: [facility] CATH LAB;  Service: Cardiovascular;  Laterality: N/A;  . ABDOMINAL HYSTERECTOMY    . CORONARY ANGIOPLASTY    . DRESSING CHANGE UNDER ANESTHESIA  12/02/2011   Procedure: DRESSING CHANGE UNDER ANESTHESIA;  Surgeon: Carole Civil, MD;  Location: [facility];  Service: Orthopedics;  Laterality: Right;  . ENDARTERECTOMY FEMORAL Right 09/15/2012   Procedure: ENDARTERECTOMY FEMORAL;  Surgeon: Mal Misty, MD;  Location: [place];  Service: Vascular;  Laterality: Right;  . FASCIOTOMY  11/29/2011   Procedure: FASCIOTOMY;  Surgeon: Carole Civil, MD;  Location: [facility];  Service: Orthopedics;  Laterality: Right;  right thigh   . FEMORAL-POPLITEAL BYPASS  GRAFT Right 09/15/2012   Procedure: BYPASS GRAFT FEMORAL-POPLITEAL ARTERY;  Surgeon: Mal Misty, MD;  Location: [place];  Service: Vascular;  Laterality: Right;  . HIP PINNING,CANNULATED  11/19/2011   Procedure: CANNULATED HIP PINNING;   Surgeon: Sanjuana Kava, MD;  Location: [facility];  Service: Orthopedics;  Laterality: Right;  . PATCH ANGIOPLASTY Right 09/15/2012   Procedure: PATCH ANGIOPLASTY;  Surgeon: Mal Misty, MD;  Location: [place];  Service: Vascular;  Laterality: Right;  . TUBAL LIGATION       reports that she has never smoked. She has never used smokeless tobacco. She reports that she does not drink alcohol or use drugs.  No Known Allergies  Family History  Problem Relation Age of Onset  . Heart failure Mother   . Heart disease Mother   . Stroke Father   . Heart disease Sister   . Diabetes Son   . Hypertension Sister   . Hypertension Sister   . Stroke Sister     Prior to Admission medications   Medication Sig Start Date End Date Taking? Authorizing Provider  aspirin EC 81 MG tablet Take 81 mg by mouth daily.   Yes [provider]  CARTIA XT 240 MG 24 hr capsule TAKE ONE CAPSULE BY MOUTH ONCE DAILY 12/16/16  Yes Herminio Commons, MD  gabapentin (NEURONTIN) 100 MG capsule Take 100 mg by mouth 3 (three) times daily.     Yes [provider]  guaiFENesin (MUCINEX) 600 MG 12 hr tablet Take 1 tablet (600 mg total) by mouth 2 (two) times daily. 02/15/17  Yes Kathie Dike, MD  hydroxyurea (HYDREA) 500 MG capsule Take 2 capsules (1,000 mg total) by mouth daily. May take with food to minimize GI side effects. 03/03/17  Yes Twana First, MD  levothyroxine (SYNTHROID, LEVOTHROID) 75 MCG tablet Take 75 mcg by mouth daily before breakfast.  10/16/14  Yes [provider]  metolazone (ZAROXOLYN) 2.5 MG tablet Take 2.5 mg if wt gain is 3 lbs or more in 24 hrs Patient taking differently: Take 2.5 mg by mouth daily as needed (for fluid). Take 2.5 mg if wt gain is 3 lbs or more in 24 hrs 08/12/16  Yes Herminio Commons, MD  metoprolol (LOPRESSOR) 100 MG tablet Take 100 mg by mouth 2 (two) times daily. 06/05/15  Yes [provider]  Potassium Chloride ER 20 MEQ TBCR Take 20 mEq by mouth  daily. 11/29/14  Yes Black, Lezlie Octave, NP  pravastatin (PRAVACHOL) 20 MG tablet Take 10 mg by mouth daily.    Yes [provider]  torsemide (DEMADEX) 20 MG tablet Take 2 tablets (40 mg total) by mouth daily. 03/25/16  Yes Lendon Colonel, NP  warfarin (COUMADIN) 5 MG tablet Take 1 tablet daily except 1/2 tablet on Mondays, Wednesdays and Fridays Patient taking differently: Take 2.5-5 mg by mouth See admin instructions. Take 1 tablet daily except 1/2 tablet on Mondays, Wednesdays, Fridays, and Saturdays 07/31/16  Yes Herminio Commons, MD  benzonatate (TESSALON) 100 MG capsule Take 1 capsule (100 mg total) by mouth 3 (three) times daily as needed for cough. Patient not taking: Reported on 03/04/2017 02/15/17   Kathie Dike, MD    Physical Exam: Vitals:   03/04/17 2230 03/04/17 2300 03/04/17 2330 03/04/17 2345  BP: (!) 113/58 107/73 108/72   Pulse: 80 85 (!) 40 79  Resp: 18 16 20 20   Temp:      TempSrc:      SpO2: 94%  93% 92% 94%  Weight:        Constitutional: NAD, calm, comfortable Eyes: PERRL, lids and conjunctivae normal ENMT: Mucous membranes are moist. Posterior pharynx clear of any exudate or lesions. Neck: normal, supple, no masses, no thyromegaly Respiratory: clear to auscultation bilaterally, no wheezing, no crackles. Normal respiratory effort. No accessory muscle use.  Cardiovascular: Regular rate and rhythm, no murmurs / rubs / gallops. No extremity edema. Nonpalpable pedal pulse on RLE. LLE pulses are palpable. No carotid bruits.  Abdomen: no tenderness, no masses palpated. No hepatosplenomegaly. Bowel sounds positive.  Musculoskeletal: no clubbing / cyanosis.  Good ROM, no contractures. Normal muscle tone.  Skin: Multiple areas of ecchymosis on extremities. Mild cyanosis of RLE. Neurologic: CN 2-12 grossly intact. Decreased sensation on lower extremities, DTR normal. Strength 5/5 in all 4.  Psychiatric: Normal judgment and insight. Alert and oriented x 4.  Normal mood.     Labs on Admission: I have personally reviewed following labs and imaging studies  CBC:  Recent Labs Lab 03/03/17 1039 03/04/17 2159  WBC 43.5* 46.6*  NEUTROABS 39.2* 41.9*  HGB 10.6* 10.0*  HCT 37.9 35.8*  MCV 78.1 77.0*  PLT 1,234* 9,381*   Basic Metabolic Panel:  Recent Labs Lab 03/03/17 1039 03/04/17 2159  NA 137 140  K 3.7 3.1*  CL 97* 100*  CO2 29 26  GLUCOSE 103* 88  BUN 33* 39*  CREATININE 1.56* 1.39*  CALCIUM 8.7* 8.7*   GFR: Estimated Creatinine Clearance: 39.2 mL/min (A) (by C-G formula based on SCr of 1.39 mg/dL (H)). Liver Function Tests:  Recent Labs Lab 03/03/17 1039  AST 25  ALT 9*  ALKPHOS 133*  BILITOT 1.1  PROT 6.7  ALBUMIN 3.1*   No results for input(s): LIPASE, AMYLASE in the last 168 hours. No results for input(s): AMMONIA in the last 168 hours. Coagulation Profile:  Recent Labs Lab 03/04/17 2159  INR 3.74   Cardiac Enzymes: No results for input(s): CKTOTAL, CKMB, CKMBINDEX, TROPONINI in the last 168 hours. BNP (last 3 results) No results for input(s): PROBNP in the last 8760 hours. HbA1C: No results for input(s): HGBA1C in the last 72 hours. CBG: No results for input(s): GLUCAP in the last 168 hours. Lipid Profile: No results for input(s): CHOL, HDL, LDLCALC, TRIG, CHOLHDL, LDLDIRECT in the last 72 hours. Thyroid Function Tests: No results for input(s): TSH, T4TOTAL, FREET4, T3FREE, THYROIDAB in the last 72 hours. Anemia Panel:  Recent Labs  03/03/17 1039  FERRITIN 61  TIBC 347  IRON 15*   Urine analysis:    Component Value Date/Time   COLORURINE YELLOW 07/03/2016 1900   APPEARANCEUR CLEAR 07/03/2016 1900   LABSPEC 1.015 07/03/2016 1900   PHURINE 5.5 07/03/2016 1900   GLUCOSEU NEGATIVE 07/03/2016 1900   HGBUR NEGATIVE 07/03/2016 1900   BILIRUBINUR NEGATIVE 07/03/2016 1900   KETONESUR NEGATIVE 07/03/2016 1900   PROTEINUR NEGATIVE 07/03/2016 1900   UROBILINOGEN 1.0 09/14/2012 2337   NITRITE  NEGATIVE 07/03/2016 1900   LEUKOCYTESUR SMALL (A) 07/03/2016 1900    Radiological Exams on Admission: No results found.  11/30/2015 Echo completely without image enhancing agent ------------------------------------------------------------------- LV EF: 60% -   65%  ------------------------------------------------------------------- Indications:      CHF - 428.0.  ------------------------------------------------------------------- History:   PMH:   Dyspnea.  Atrial fibrillation.  PMH:  Long term Anticoagulation  Risk factors:  Hypertension.  ------------------------------------------------------------------- Study Conclusions  - Left ventricle: The cavity size was normal. Wall thickness was   increased in a pattern of  mild LVH. Systolic function was normal.   The estimated ejection fraction was in the range of 60% to 65%.   Wall motion was normal; there were no regional wall motion   abnormalities. The study was not technically sufficient to allow   evaluation of LV diastolic dysfunction due to atrial   fibrillation. - Mitral valve: Calcified annulus. There was mild regurgitation. - Left atrium: The atrium was severely dilated. - Right ventricle: Systolic function was mildly reduced. - Right atrium: The atrium was severely dilated. - Tricuspid valve: There was mild regurgitation. - Pulmonary arteries: Systolic pressure was mildly to moderately   increased. PA peak pressure: 48 mm Hg (S). - Systemic veins: IVC dilated with normal respiratory variation.   Estimated CVP 8 mmHg.  EKG: Independently reviewed.   Assessment/Plan Principal Problem:   Lower extremity arterial insufficiency, severe, right ([place]) Transferred to [facility]/stepdown. Continue heparin infusion. On home warfarin with a supratherapeutic INR 3.74 Continue analgesics and antiemetics as needed. Vascular surgery to evaluate.  Active Problems:   Peripheral vascular disease (HCC) Currently on  heparin infusion and has supratherapeutic INR. Vascular surgery to evaluate at [facility].    Chronic atrial fibrillation (HCC) CHA?DS?-VASc Score of at least 7. On warfarin at home. INR is currently supratherapeutic. The patient is currently on heparin infusion. Continue Cardizem and CD 240 mg by mouth daily. Continue metoprolol 100 mg by mouth twice a day.    HTN (hypertension) Continue Cardizem 240 mg by mouth daily. Continue metoprolol 100 mg by mouth twice a day. Also taking torsemide 40 mg by mouth once a day. Monitor blood pressure, renal function and electrolytes..    Hyperlipidemia Continue pravastatin 10 mg by mouth daily. Monitor LFTs periodically. Fasting lipid monitoring as an outpatient.    Hypothyroidism Continue levothyroxine 75 g by mouth daily.    Polycythemia vera   Thrombocytosis (HCC) Continue hydroxyurea. Continue anticoagulation. Monitor hemoglobin and platelet count.    DVT prophylaxis: Currently on warfarin for A. fib and started on heparin infusion per vascular surgery recommendation. Code Status: Full code. Family Communication:  Disposition Plan: Transfer to [facility] for vascular surgery evaluation and further treatment. Consults called: Vascular surgery Annamarie Major, MD) Admission status: Inpatient/stepdown.   Reubin Milan MD [facility] Pager [phone].  If 7PM-7AM, please contact night-coverage [email or web address] Password [email or web address]  03/05/2017, 12:00 AM

## 2017-03-05 NOTE — Plan of Care (Signed)
Problem: Safety: Goal: Ability to remain free from injury will improve Outcome: Progressing Call bell within reach. Bed in low position.

## 2017-03-05 NOTE — Progress Notes (Signed)
Patient admitted from [place] with right lower leg occlusion. Alert and oriented. Denies pain but nauseated. Vitals stable. Telemetry applied and CCMD called for verification. Oriented to room and surroundings. Will continue to monitor.

## 2017-03-05 NOTE — Progress Notes (Signed)
Lab called critical Platelets on patient. MD on call notified.

## 2017-03-05 NOTE — Progress Notes (Signed)
ANTICOAGULATION CONSULT NOTE - Initial Consult  Pharmacy Consult for Coumadin Indication: Afib and PVD  No Known Allergies  Patient Measurements: Weight: 154 lb (69.9 kg)  Vital Signs: Temp: 97.8 F (36.6 C) (08/01 2111) Temp Source: Oral (08/01 2111) BP: 102/63 (08/02 0000) Pulse Rate: 77 (08/02 0000)  Labs:  Recent Labs  03/03/17 1039 03/04/17 2159  HGB 10.6* 10.0*  HCT 37.9 35.8*  PLT 1,234* 1,620*  LABPROT  --  37.9*  INR  --  3.74  CREATININE 1.56* 1.39*    Estimated Creatinine Clearance: 39.2 mL/min (A) (by C-G formula based on SCr of 1.39 mg/dL (H)).   Medical History: Past Medical History:  Diagnosis Date  . Arthritis   . Atrial fibrillation ([place])   . Atrial fibrillation, chronic ([place])   . CHF (congestive heart failure) ([place])   . Deaf   . Diastolic heart failure ([place])   . DVT (deep venous thrombosis) ([place])   . Essential hypertension   . Hypothyroidism   . On home O2   . Peripheral neuropathy   . Peripheral vascular disease ([place])   . 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 Ulcer of ankle ([place])    Bilateral 2015  . Varicose veins     Medications:  Prescriptions Prior to Admission  Medication Sig Dispense Refill Last Dose  . aspirin EC 81 MG tablet Take 81 mg by mouth daily.   03/04/2017 at Unknown time  . CARTIA XT 240 MG 24 hr capsule TAKE ONE CAPSULE BY MOUTH ONCE DAILY 90 capsule 3 03/04/2017 at Unknown time  . gabapentin (NEURONTIN) 100 MG capsule Take 100 mg by mouth 3 (three) times daily.     03/04/2017 at Unknown time  . guaiFENesin (MUCINEX) 600 MG 12 hr tablet Take 1 tablet (600 mg total) by mouth 2 (two) times daily. 30 tablet 0 03/04/2017 at Unknown time  . hydroxyurea (HYDREA) 500 MG capsule Take 2 capsules (1,000 mg total) by mouth daily. May take with food to minimize GI side effects. 60 capsule 2 03/04/2017 at Unknown time  . levothyroxine (SYNTHROID, LEVOTHROID) 75 MCG tablet Take 75 mcg by mouth daily before breakfast.    03/04/2017 at Unknown  time  . metolazone (ZAROXOLYN) 2.5 MG tablet Take 2.5 mg if wt gain is 3 lbs or more in 24 hrs (Patient taking differently: Take 2.5 mg by mouth daily as needed (for fluid). Take 2.5 mg if wt gain is 3 lbs or more in 24 hrs) 30 tablet 3 unknown  . metoprolol (LOPRESSOR) 100 MG tablet Take 100 mg by mouth 2 (two) times daily.   03/04/2017 at Unknown time  . Potassium Chloride ER 20 MEQ TBCR Take 20 mEq by mouth daily. 30 tablet 1 03/04/2017 at Unknown time  . pravastatin (PRAVACHOL) 20 MG tablet Take 10 mg by mouth daily.    03/04/2017 at Unknown time  . torsemide (DEMADEX) 20 MG tablet Take 2 tablets (40 mg total) by mouth daily. 180 tablet 3 03/04/2017 at Unknown time  . warfarin (COUMADIN) 5 MG tablet Take 1 tablet daily except 1/2 tablet on Mondays, Wednesdays and Fridays (Patient taking differently: Take 2.5-5 mg by mouth See admin instructions. Take 1 tablet daily except 1/2 tablet on Mondays, Wednesdays, Fridays, and Saturdays) 90 tablet 4 03/04/2017 at 1800  . benzonatate (TESSALON) 100 MG capsule Take 1 capsule (100 mg total) by mouth 3 (three) times daily as needed for cough. (Patient not taking: Reported on 03/04/2017) 20 capsule 0 Not Taking at Unknown time  Scheduled:  . aspirin EC  81 mg Oral Daily  . diltiazem  240 mg Oral Daily  . gabapentin  100 mg Oral TID  . guaiFENesin  600 mg Oral BID  . hydroxyurea  1,000 mg Oral Q breakfast  . levothyroxine  75 mcg Oral QAC breakfast  . metoprolol tartrate  100 mg Oral BID  . pantoprazole  40 mg Oral Daily  . Potassium Chloride ER  20 mEq Oral Daily  . pravastatin  10 mg Oral Daily  . torsemide  40 mg Oral Daily   Infusions:  . heparin 12 Units/kg/hr (03/04/17 2227)    Assessment: 76yo female c/o leg pain, known chronic occlusion, vascular surgery to evaluate, to continue Coumadin for Afib and now PVD; current INR above goal with last dose of Coumadin taken 8/1.  Heparin was started at [facility] prior to INR result.  Goal of Therapy:  INR 2-3    Plan:  Will hold heparin and warfarin for now and monitor INR.  Wynona Neat, PharmD, BCPS  03/05/2017,1:02 AM

## 2017-03-05 NOTE — Progress Notes (Addendum)
[facility] Progress Note  Subjective: R calf and leg hurt " a whole lot",  Has morphine ordered but not asking for it. Nauseous   Vitals:   03/05/17 0113 03/05/17 0430 03/05/17 0828 03/05/17 1354  BP:  113/78 111/62 116/69  Pulse:  76  90  Resp:  14 19   Temp:  (!) 97.4 F (36.3 C)  97.9 F (36.6 C)  TempSrc:  Oral  Oral  SpO2:  94% 98%   Weight:      Height: 6' (1.829 m)       Inpatient medications: . aspirin EC  81 mg Oral Daily  . diltiazem  60 mg Oral Q6H  . gabapentin  100 mg Oral TID  . guaiFENesin  600 mg Oral BID  . hydroxyurea  1,000 mg Oral Q breakfast  . levothyroxine  75 mcg Oral QAC breakfast  . metoprolol tartrate  50 mg Oral BID  . pantoprazole  40 mg Oral Daily  . potassium chloride SA  20 mEq Oral Daily  . pravastatin  10 mg Oral Daily   . sodium chloride     morphine, ondansetron **OR** ondansetron (ZOFRAN) IV  Exam: Thin tall WF , no distress, nasal O2 No jvd Chest dec'd at bases, no wheezing Irreg irreg no mrg Abd soft ntnd no ascites Ext healed scars R upper leg; lower legs cyanotic, discolored purplish No wounds , no LE edema, warm to touch bilat. Absent pulses bilat feet  Creat up 1.55, K down 2.8      Impression: 1  R leg pain/ ischemic RLE - hx R fem-pop 3-4 yrs ago; on IV hep, reversing ^INR w vit K, for procedure tomorrow per [facility].   2  Chronic afib - CHADSVASC 2 score at least 7; warfarin at home, IV hep here. BP's soft, changed dilt to 60 qid for now and dec'd MTP to 50 bid from 100 bid.  3   AKI - not eating on torsemide. DC'd torse and give IVF at 75 overnight pre procedure 4  HTN - cont dilt/ MTP 5  PVera - cont hydrea 6  HL  Plan - as above   Kelly Splinter MD [facility] pgr [phone] 03/05/2017, 7:25 PM    Recent Labs Lab 03/03/17 1039 03/04/17 2159 03/05/17 0224  NA 137 140 137  K 3.7 3.1* 2.8*  CL 97* 100* 99*  CO2 29 26 29   GLUCOSE 103* 88 108*  BUN 33* 39* 35*  CREATININE 1.56* 1.39* 1.55*   CALCIUM 8.7* 8.7* 8.2*    Recent Labs Lab 03/03/17 1039 03/05/17 0224  AST 25 26  ALT 9* 11*  ALKPHOS 133* 126  BILITOT 1.1 1.0  PROT 6.7 5.9*  ALBUMIN 3.1* 2.8*    Recent Labs Lab 03/03/17 1039 03/04/17 2159 03/05/17 0224  WBC 43.5* 46.6* 44.8*  NEUTROABS 39.2* 41.9* 39.0*  HGB 10.6* 10.0* 9.1*  HCT 37.9 35.8* 33.1*  MCV 78.1 77.0* 75.9*  PLT 1,234* 1,620* 1,392*   Iron/TIBC/Ferritin/ %Sat    Component Value Date/Time   IRON 15 (L) 03/03/2017 1039   TIBC 347 03/03/2017 1039   FERRITIN 61 03/03/2017 1039   IRONPCTSAT 4 (L) 03/03/2017 1039

## 2017-03-06 ENCOUNTER — Encounter (HOSPITAL_COMMUNITY): Payer: Self-pay | Admitting: Vascular Surgery

## 2017-03-06 ENCOUNTER — Encounter (HOSPITAL_COMMUNITY)
Admission: EM | Disposition: A | Discharge status: Nursing Home (Includes ICF) | Payer: Self-pay | Source: Home / Self Care | Attending: Pulmonary Disease

## 2017-03-06 DIAGNOSIS — I10 Essential (primary) hypertension: Secondary | ICD-10-CM

## 2017-03-06 DIAGNOSIS — Z01810 Encounter for preprocedural cardiovascular examination: Secondary | ICD-10-CM

## 2017-03-06 DIAGNOSIS — I739 Peripheral vascular disease, unspecified: Secondary | ICD-10-CM

## 2017-03-06 DIAGNOSIS — E785 Hyperlipidemia, unspecified: Secondary | ICD-10-CM

## 2017-03-06 DIAGNOSIS — I482 Chronic atrial fibrillation: Secondary | ICD-10-CM

## 2017-03-06 DIAGNOSIS — D45 Polycythemia vera: Secondary | ICD-10-CM

## 2017-03-06 HISTORY — PX: LOWER EXTREMITY ANGIOGRAPHY: CATH118251

## 2017-03-06 HISTORY — PX: ABDOMINAL AORTOGRAM: CATH118222

## 2017-03-06 LAB — CBC WITH DIFFERENTIAL/PLATELET
BAND NEUTROPHILS: 0 %
BASOS ABS: 0 10*3/uL (ref 0.0–0.1)
BLASTS: 0 %
Basophils Relative: 0 %
Eosinophils Absolute: 1 10*3/uL — ABNORMAL HIGH (ref 0.0–0.7)
Eosinophils Relative: 2 %
HEMATOCRIT: 34.1 % — AB (ref 36.0–46.0)
HEMOGLOBIN: 9.5 g/dL — AB (ref 12.0–15.0)
LYMPHS ABS: 3.4 10*3/uL (ref 0.7–4.0)
Lymphocytes Relative: 7 %
MCH: 21.1 pg — ABNORMAL LOW (ref 26.0–34.0)
MCHC: 27.9 g/dL — ABNORMAL LOW (ref 30.0–36.0)
MCV: 75.8 fL — ABNORMAL LOW (ref 78.0–100.0)
METAMYELOCYTES PCT: 0 %
MYELOCYTES: 0 %
Monocytes Absolute: 0.5 10*3/uL (ref 0.1–1.0)
Monocytes Relative: 1 %
Neutro Abs: 44.2 10*3/uL — ABNORMAL HIGH (ref 1.7–7.7)
Neutrophils Relative %: 90 %
PLATELETS: 1591 10*3/uL — AB (ref 150–400)
PROMYELOCYTES ABS: 0 %
RBC: 4.5 MIL/uL (ref 3.87–5.11)
RDW: 25.4 % — ABNORMAL HIGH (ref 11.5–15.5)
WBC: 49.1 10*3/uL — ABNORMAL HIGH (ref 4.0–10.5)
nRBC: 0 /100 WBC

## 2017-03-06 LAB — PATHOLOGIST SMEAR REVIEW

## 2017-03-06 LAB — BASIC METABOLIC PANEL
ANION GAP: 8 (ref 5–15)
BUN: 34 mg/dL — ABNORMAL HIGH (ref 6–20)
CO2: 27 mmol/L (ref 22–32)
Calcium: 8.5 mg/dL — ABNORMAL LOW (ref 8.9–10.3)
Chloride: 99 mmol/L — ABNORMAL LOW (ref 101–111)
Creatinine, Ser: 1.6 mg/dL — ABNORMAL HIGH (ref 0.44–1.00)
GFR calc Af Amer: 36 mL/min — ABNORMAL LOW (ref >60)
GFR, EST NON AFRICAN AMERICAN: 31 mL/min — AB (ref >60)
GLUCOSE: 101 mg/dL — AB (ref 65–99)
POTASSIUM: 4 mmol/L (ref 3.5–5.1)
Sodium: 134 mmol/L — ABNORMAL LOW (ref 135–145)

## 2017-03-06 LAB — SURGICAL PCR SCREEN
MRSA, PCR: NEGATIVE
STAPHYLOCOCCUS AUREUS: NEGATIVE

## 2017-03-06 LAB — PROTIME-INR
INR: 1.85
INR: 1.49
PROTHROMBIN TIME: 18.2 s — AB (ref 11.4–15.2)
Prothrombin Time: 21.6 seconds — ABNORMAL HIGH (ref 11.4–15.2)

## 2017-03-06 SURGERY — ABDOMINAL AORTOGRAM
Anesthesia: LOCAL

## 2017-03-06 MED ORDER — ONDANSETRON HCL 4 MG/2ML IJ SOLN: 4.0000 mg | Freq: Four times a day (QID) | INTRAMUSCULAR | Status: DC | PRN

## 2017-03-06 MED ORDER — OXYCODONE HCL 5 MG PO TABS
5.0000 mg | ORAL_TABLET | Freq: Four times a day (QID) | ORAL | Status: DC | PRN
  Administered 2017-03-06 – 2017-03-09 (×9): 5 mg via ORAL
  Filled 2017-03-06 (×9): qty 1

## 2017-03-06 MED ORDER — IODIXANOL 320 MG/ML IV SOLN
INTRAVENOUS | Status: DC | PRN
  Administered 2017-03-06: 176 mL via INTRAVENOUS

## 2017-03-06 MED ORDER — MORPHINE SULFATE (PF) 2 MG/ML IV SOLN
2.0000 mg | INTRAVENOUS | Status: DC | PRN
  Administered 2017-03-08: 2 mg via INTRAVENOUS
  Administered 2017-03-08 – 2017-03-10 (×6): 4 mg via INTRAVENOUS
  Filled 2017-03-06 (×3): qty 2
  Filled 2017-03-06: qty 1
  Filled 2017-03-06 (×4): qty 2

## 2017-03-06 MED ORDER — HEPARIN (PORCINE) IN NACL 2-0.9 UNIT/ML-% IJ SOLN
INTRAMUSCULAR | Status: AC | PRN
  Administered 2017-03-06: 1000 mL

## 2017-03-06 MED ORDER — ACETAMINOPHEN 325 MG PO TABS
325.0000 mg | ORAL_TABLET | ORAL | Status: DC | PRN
  Administered 2017-03-07 – 2017-03-13 (×2): 650 mg via ORAL
  Filled 2017-03-06 (×2): qty 2

## 2017-03-06 MED ORDER — LIDOCAINE HCL (PF) 1 % IJ SOLN
INTRAMUSCULAR | Status: DC | PRN
  Administered 2017-03-06: 15 mL

## 2017-03-06 MED ORDER — SODIUM CHLORIDE 0.9 % IV SOLN: 500.0000 mL | Freq: Once | INTRAVENOUS | Status: DC | PRN

## 2017-03-06 MED ORDER — SODIUM CHLORIDE 0.45 % IV SOLN
INTRAVENOUS | Status: DC
  Administered 2017-03-06 – 2017-03-09 (×2): via INTRAVENOUS

## 2017-03-06 MED ORDER — HYDRALAZINE HCL 20 MG/ML IJ SOLN: 5.0000 mg | INTRAMUSCULAR | Status: DC | PRN

## 2017-03-06 MED ORDER — SODIUM CHLORIDE 0.9 % IV SOLN
Freq: Once | INTRAVENOUS | Status: AC
  Administered 2017-03-06: 10:00:00 via INTRAVENOUS

## 2017-03-06 MED ORDER — ACETAMINOPHEN 650 MG RE SUPP: 325.0000 mg | RECTAL | Status: DC | PRN

## 2017-03-06 MED ORDER — METOPROLOL TARTRATE 5 MG/5ML IV SOLN
2.0000 mg | INTRAVENOUS | Status: AC | PRN
  Administered 2017-03-18 – 2017-03-22 (×2): 5 mg via INTRAVENOUS
  Filled 2017-03-06 (×2): qty 5

## 2017-03-06 MED ORDER — HEPARIN (PORCINE) IN NACL 2-0.9 UNIT/ML-% IJ SOLN
INTRAMUSCULAR | Status: AC
  Filled 2017-03-06: qty 1000

## 2017-03-06 MED ORDER — LABETALOL HCL 5 MG/ML IV SOLN: 10.0000 mg | INTRAVENOUS | Status: DC | PRN

## 2017-03-06 MED ORDER — HEPARIN (PORCINE) IN NACL 100-0.45 UNIT/ML-% IJ SOLN
1450.0000 [IU]/h | INTRAMUSCULAR | Status: DC
  Administered 2017-03-06: 800 [IU]/h via INTRAVENOUS
  Administered 2017-03-07: 1200 [IU]/h via INTRAVENOUS
  Filled 2017-03-06 (×2): qty 250

## 2017-03-06 MED ORDER — HEPARIN (PORCINE) IN NACL 100-0.45 UNIT/ML-% IJ SOLN
800.0000 [IU]/h | INTRAMUSCULAR | Status: DC
  Administered 2017-03-06: 800 [IU]/h via INTRAVENOUS
  Filled 2017-03-06: qty 250

## 2017-03-06 MED ORDER — GUAIFENESIN-DM 100-10 MG/5ML PO SYRP
15.0000 mL | ORAL_SOLUTION | ORAL | Status: DC | PRN
  Filled 2017-03-06: qty 15

## 2017-03-06 MED ORDER — PHENOL 1.4 % MT LIQD: 1.0000 | OROMUCOSAL | Status: DC | PRN

## 2017-03-06 MED ORDER — LIDOCAINE HCL (PF) 1 % IJ SOLN
INTRAMUSCULAR | Status: AC
  Filled 2017-03-06: qty 30

## 2017-03-06 SURGICAL SUPPLY — 12 items
CATH ANGIO 5F PIGTAIL 65CM (CATHETERS) ×3 IMPLANT
CATH CROSS OVER TEMPO 5F (CATHETERS) ×3 IMPLANT
CATH STRAIGHT 5FR 65CM (CATHETERS) ×3 IMPLANT
COVER PRB 48X5XTLSCP FOLD TPE (BAG) ×2 IMPLANT
COVER PROBE 5X48 (BAG) ×1
GUIDEWIRE ANGLED .035X150CM (WIRE) ×3 IMPLANT
KIT PV (KITS) ×3 IMPLANT
SHEATH PINNACLE 5F 10CM (SHEATH) ×3 IMPLANT
SYRINGE MEDRAD AVANTA MACH 7 (SYRINGE) ×3 IMPLANT
TRANSDUCER W/STOPCOCK (MISCELLANEOUS) ×3 IMPLANT
TRAY PV CATH (CUSTOM PROCEDURE TRAY) ×3 IMPLANT
WIRE HITORQ VERSACORE ST 145CM (WIRE) ×3 IMPLANT

## 2017-03-06 NOTE — Progress Notes (Signed)
ANTICOAGULATION CONSULT NOTE - Follow Up Consult  Pharmacy Consult for Heparin when INR <2 (warfarin on hold) Indication: Afib/PVD  No Known Allergies  Patient Measurements: Height: 6' (182.9 cm) Weight: 154 lb (69.9 kg) IBW/kg (Calculated) : 73.1  Vital Signs: Temp: 98 F (36.7 C) (08/02 2000) Temp Source: Oral (08/02 2000) BP: 100/63 (08/03 0025) Pulse Rate: 78 (08/02 2141)  Labs:  Recent Labs  03/04/17 2159 03/05/17 0224 03/06/17 0329  HGB 10.0* 9.1* 9.5*  HCT 35.8* 33.1* 34.1*  PLT 1,620* 1,392* 1,591*  LABPROT 37.9* 39.5* 21.6*  INR 3.74 3.93 1.85  CREATININE 1.39* 1.55* 1.60*    Estimated Creatinine Clearance: 34 mL/min (A) (by C-G formula based on SCr of 1.6 mg/dL (H)).   Assessment:  76 y/o F on warfarin PTA that been reversed with Vit K/FFP. Plans to start heparin when INR <2. INR is 1.85 this AM. Plans for arteriogram once INR is acceptable for vascular surgery.   Goal of Therapy:  Heparin level 0.3-0.7 units/ml Monitor platelets by anticoagulation protocol: Yes   Plan:  -Start heparin drip at 800 units/hr -1300 HL  Jenelle Drennon 03/06/2017,4:38 AM

## 2017-03-06 NOTE — Op Note (Addendum)
Procedure: Abdominal aortogram with bilateral lower extremity runoff  Preoperative diagnosis: Rest pain right foot. Postoperative diagnosis: Same  Anesthesia: Local  Operative findings: #1 right leg occlusion of right superficial femoral popliteal posterior tibial and anterior tibial arteries and reconstitution of diminutive peroneal artery  Left lower extremity: Patent left lower extremity arterial tree in its entirety  Operative details: Cut obtaining informed consent, the patient taken PV lab. The patient was supine position the Angio table. Both groins were prepped and draped in usual sterile fashion. Ultrasound was used to locate the left common femoral artery as well as the femoral bifurcation. Local anesthesia was inferior over the left common femoral artery and an introducer needle was used to cannulate the left common femoral artery without difficulty. An 035 versacore wire was inserted up the abdominal aorta under fluoroscopic guidance. A 5 French sheath was placed over the guidewire and the left common femoral artery. This was thoroughly flushed with heparinized saline. A 5 French pigtail catheter was advanced over the guidewire into the abdominal aorta and abdominal aortogram was obtained in AP projection. The left and right renal arteries are patent. The infrarenal abdominal aorta is patent without stenosis. The left and right common external and internal iliac arteries are all patent. Next the Catheters pulled down just above the aortic bifurcation and bilateral extremity runoff views were performed through the pigtail catheter.  In the left lower extremity, the left common femoral profunda femoris and superficial femoral popliteal anterior tibial posterior tibial and peroneal arteries are all widely patent.  In the right lower extremity, the vessels were not very well opacified distally. So at this point I removed the pigtail catheter over a guidewire and the right common iliac artery  was selected with a crossover catheter. An 035 angled Glidewire was then advanced into the distal right external iliac artery. The Crosser catheter was removed and exchanged for a 5 French straight catheter. Right arch the arteriogram was then obtained through the straight catheter.  In the right lower externa, the right common femoral and profunda is patent. There are not very many distal branches off the profunda however it does give off some collaterals. The native right superficial femoral artery is occluded. There is reconstitution faintly of what appears to be either the very distal popliteal artery or tibioperoneal trunk via collaterals from the profunda. There appears to be one-vessel runoff via a very faint small peroneal artery which gives off a posterior communicating branch to the foot. The posterior tibial and anterior tibial arteries are occluded.  At this point the straight catheter was removed. The 5 French sheath was removed and hemostasis obtained with direct pressure.  Operative management: I will discuss the findings of the patient's angiogram with Dr. Trula Slade. Potentially the patient might be a candidate for a right common femoral to tibioperoneal trunk/peroneal bypass if she has adequate conduit. She also has tenuous pulmonary status and may not be a candidate for a bypass. I will review the films further with Dr. Trula Slade and he will make a decision regarding her further management.  Ruta Hinds, MD [facility] Office: [phone] Pager: [phone]

## 2017-03-06 NOTE — Consult Note (Signed)
Cardiology Consult    Patient ID: REJINA ODLE MRN: [id number], DOB/AGE: 1940-10-10   Admit date: 03/04/2017 Date of Consult: 03/06/2017  Primary Physician: [name], MD Reason for Consult: Preoperative Clearance Primary Cardiologist: Dr. Bronson Ing Requesting Provider: Dr. Trula Slade  Patient Profile    Charlotte [name] is a 76 y.o. female with past medical history of persistent atrial fibrillation (on Coumadin), chronic diastolic CHF, chronic respiratory failure (on 3L [place] at baseline), HTN, Stage 3 CKD, polycythemia vera, and PVD (s/p right femoral endarterectomy and femoral to above-knee popliteal bypass in 2014, known superficial femoral artery occlusion and occlusion of fem-pop bypass by imaging in 08/2016) who is being seen today for the evaluation of preoperative cardiac clearance at the request of Dr. Trula Slade.   History of Present Illness    She presented to [facility] ED on 03/04/2017 for evaluation of acute onset right foot pain with color changes over the past few days. Vascular surgery was consulted with plans to perform an arteriogram once INR was subtherapeutic. Labs today show WBC of 49.1, Hgb 9.5, platelets 1591, Na+ 134, K+ 4.0, creatinine 1.60 (close to baseline). INR 1.85. EKG shows atrial fibrillation, HR 79, with PVC's and nonspecific ST changes along inferior and lateral leads (slightly more prominent when compared to prior tracings).     She went to the catheterization lab today which showed the right common femoral and profunda is patent with the native right superficial femoral artery being occluded. There is reconstitution faintly of what appears to be either the very distal popliteal artery or tibioperoneal trunk via collaterals from the profunda. There appears to be one-vessel runoff via a very faint small peroneal artery which gives off a posterior communicating branch to the foot. The posterior tibial and anterior tibial arteries are occluded. The plan is  potentially for a right common femoral to tibioperoneal trunk/peroneal bypass early next week, therefore Cardiology is consulted for cardiac clearance.   In talking with the patient, functional status is difficult to assess as she is not overly active at baseline. She has required oxygen supplementation since last year due to "low oxygen levels". The patient nor her husband are sure of any diagnosis of COPD or ILD. She denies any recent chest pain or palpitations. No orthopnea, PND, or lower extremity edema. She is able to perform ADL's and household chores independently but cannot walk more than 20 ft at a time without stopping to rest due to her right foot pain and dyspnea. .   She denies any history of CAD or prior MI's. HR and BP have been well-controlled at home per her report.   Past Medical History   Past Medical History:  Diagnosis Date  . Arthritis   . Atrial fibrillation, chronic (HCC)    a. on Coumadin for anticoagulation.   . CHF (congestive heart failure) ([place])   . Deaf   . Diastolic heart failure ([place])   . DVT (deep venous thrombosis) ([place])   . Essential hypertension   . Hyperlipidemia   . Hypothyroidism   . On home O2   . Peripheral neuropathy   . Peripheral vascular disease ([place])   . 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 Ulcer of ankle ([place])    Bilateral 2015  . Varicose veins     Past Surgical History:  Procedure Laterality Date  . ABDOMINAL AORTAGRAM N/A 09/14/2012   Procedure: ABDOMINAL [name];  Surgeon: Serafina Mitchell, MD;  Location: [facility] CATH LAB;  Service: Cardiovascular;  Laterality: N/A;  .  ABDOMINAL AORTOGRAM N/A 03/06/2017   Procedure: ABDOMINAL AORTOGRAM;  Surgeon: Elam Dutch, MD;  Location: [place] CV LAB;  Service: Cardiovascular;  Laterality: N/A;  . ABDOMINAL HYSTERECTOMY    . CORONARY ANGIOPLASTY    . DRESSING CHANGE UNDER ANESTHESIA  12/02/2011   Procedure: DRESSING CHANGE UNDER ANESTHESIA;  Surgeon: Carole Civil, MD;  Location: [facility]  ORS;  Service: Orthopedics;  Laterality: Right;  . ENDARTERECTOMY FEMORAL Right 09/15/2012   Procedure: ENDARTERECTOMY FEMORAL;  Surgeon: Mal Misty, MD;  Location: [facility];  Service: Vascular;  Laterality: Right;  . FASCIOTOMY  11/29/2011   Procedure: FASCIOTOMY;  Surgeon: Carole Civil, MD;  Location: [facility] ORS;  Service: Orthopedics;  Laterality: Right;  right thigh   . FEMORAL-POPLITEAL BYPASS GRAFT Right 09/15/2012   Procedure: BYPASS GRAFT FEMORAL-POPLITEAL ARTERY;  Surgeon: Mal Misty, MD;  Location: [facility];  Service: Vascular;  Laterality: Right;  . HIP PINNING,CANNULATED  11/19/2011   Procedure: CANNULATED HIP PINNING;  Surgeon: Sanjuana Kava, MD;  Location: [facility] ORS;  Service: Orthopedics;  Laterality: Right;  . LOWER EXTREMITY ANGIOGRAPHY Bilateral 03/06/2017   Procedure: Lower Extremity Angiography;  Surgeon: Elam Dutch, MD;  Location: [place] [facility] CV LAB;  Service: Cardiovascular;  Laterality: Bilateral;  . PATCH ANGIOPLASTY Right 09/15/2012   Procedure: PATCH ANGIOPLASTY;  Surgeon: Mal Misty, MD;  Location: [facility];  Service: Vascular;  Laterality: Right;  . TUBAL LIGATION       Allergies  No Known Allergies  Inpatient Medications    . aspirin EC  81 mg Oral Daily  . diltiazem  60 mg Oral Q6H  . gabapentin  100 mg Oral TID  . guaiFENesin  600 mg Oral BID  . hydroxyurea  1,000 mg Oral Q breakfast  . levothyroxine  75 mcg Oral QAC breakfast  . metoprolol tartrate  50 mg Oral BID  . pantoprazole  40 mg Oral Daily  . potassium chloride SA  20 mEq Oral Daily  . pravastatin  10 mg Oral Daily    Family History    Family History  Problem Relation Age of Onset  . Heart failure Mother   . Heart disease Mother   . Stroke Father   . Heart disease Sister   . Diabetes Son   . Hypertension Sister   . Hypertension Sister   . Stroke Sister     Social History    Social History   Social History  . Marital status: Married    Spouse name: N/A  . Number of  children: N/A  . Years of education: N/A   Occupational History  . Not on file.   Social History Main Topics  . Smoking status: Never Smoker  . Smokeless tobacco: Never Used  . Alcohol use No  . Drug use: No  . Sexual activity: Not Currently   Other Topics Concern  . Not on file   Social History Narrative  . No narrative on file     Review of Systems    General:  No chills, fever, night sweats or weight changes.  Cardiovascular:  No chest pain, edema, orthopnea, palpitations, paroxysmal nocturnal dyspnea. Positive for dyspnea on exertion and right foot pain.  Dermatological: No rash, lesions/masses Respiratory: No cough, dyspnea Urologic: No hematuria, dysuria Abdominal:   No nausea, vomiting, diarrhea, bright red blood per rectum, melena, or hematemesis Neurologic:  No visual changes, wkns, changes in mental status.  All other systems reviewed and are otherwise negative except as noted  above.  Physical Exam    Blood pressure 117/70, pulse 79, temperature 98.3 F (36.8 C), temperature source Oral, resp. rate 17, height 6' (1.829 m), weight 154 lb 8.7 oz (70.1 kg), SpO2 97 %.  General: Pleasant, Caucasian female appearing in NAD Psych: Normal affect. Neuro: Alert and oriented X 3. Moves all extremities spontaneously. HEENT: Normal  Neck: Supple without bruits or JVD. Lungs:  Resp regular and unlabored, CTA without wheezing or rales. Heart: Irregularly irregular, no s3, s4, or murmurs. Abdomen: Soft, non-tender, non-distended, BS + x 4.  Extremities: No clubbing, cyanosis or edema. Distal pulses are 1+ on the left and significant diminished on the right. Right foot is cool to touch.   Labs    Troponin (Point of Care Test) No results for input(s): TROPIPOC in the last 72 hours. No results for input(s): CKTOTAL, CKMB, TROPONINI in the last 72 hours. Lab Results  Component Value Date   WBC 49.1 (H) 03/06/2017   HGB 9.5 (L) 03/06/2017   HCT 34.1 (L) 03/06/2017   MCV  75.8 (L) 03/06/2017   PLT 1,591 (HH) 03/06/2017     Recent Labs Lab 03/05/17 0224 03/06/17 0329  NA 137 134*  K 2.8* 4.0  CL 99* 99*  CO2 29 27  BUN 35* 34*  CREATININE 1.55* 1.60*  CALCIUM 8.2* 8.5*  PROT 5.9*  --   BILITOT 1.0  --   ALKPHOS 126  --   ALT 11*  --   AST 26  --   GLUCOSE 108* 101*   Lab Results  Component Value Date   CHOL  07/19/2009    119        ATP III CLASSIFICATION:  <200     mg/dL   Desirable  200-239  mg/dL   Borderline High  >=240    mg/dL   High          HDL 32 (L) 07/19/2009   LDLCALC  07/19/2009    62        Total Cholesterol/HDL:CHD Risk Coronary Heart Disease Risk Table                     Men   Women  1/2 Average Risk   3.4   3.3  Average Risk       5.0   4.4  2 X Average Risk   9.6   7.1  3 X Average Risk  23.4   11.0        Use the calculated Patient Ratio above and the CHD Risk Table to determine the patient's CHD Risk.        ATP III CLASSIFICATION (LDL):  <100     mg/dL   Optimal  100-129  mg/dL   Near or Above                    Optimal  130-159  mg/dL   Borderline  160-189  mg/dL   High  >190     mg/dL   Very High   TRIG 124 07/19/2009   Lab Results  Component Value Date   DDIMER 1.42 (H) 10/14/2013     Radiology Studies    Dg Chest 2 View  Result Date: 02/12/2017 CLINICAL DATA:  Patient with productive cough for multiple days. EXAM: CHEST  2 VIEW COMPARISON:  Chest radiograph 07/03/2016. FINDINGS: Stable cardiomegaly. Small to moderate bilateral pleural effusions with underlying pulmonary consolidation. No pneumothorax. Thoracic spine degenerative changes. IMPRESSION: Small to moderate  bilateral pleural effusions with underlying consolidation which may represent atelectasis or infection. Electronically Signed   By: Lovey Newcomer M.D.   On: 02/12/2017 14:17    EKG & Cardiac Imaging    EKG:  Atrial fibrillation, HR 79, with PVC's and nonspecific ST changes along inferior and lateral leads (slightly more prominent  when compared to prior tracings).   - Personally Reviewed  Echocardiogram: 11/2015 Study Conclusions  - Left ventricle: The cavity size was normal. Wall thickness was   increased in a pattern of mild LVH. Systolic function was normal.   The estimated ejection fraction was in the range of 60% to 65%.   Wall motion was normal; there were no regional wall motion   abnormalities. The study was not technically sufficient to allow   evaluation of LV diastolic dysfunction due to atrial   fibrillation. - Mitral valve: Calcified annulus. There was mild regurgitation. - Left atrium: The atrium was severely dilated. - Right ventricle: Systolic function was mildly reduced. - Right atrium: The atrium was severely dilated. - Tricuspid valve: There was mild regurgitation. - Pulmonary arteries: Systolic pressure was mildly to moderately   increased. PA peak pressure: 48 mm Hg (S). - Systemic veins: IVC dilated with normal respiratory variation.   Estimated CVP 8 mmHg.  Lower Extremity Angiography: 03/2017 Left lower extremity: Patent left lower extremity arterial tree in its entirety  Operative details: Cut obtaining informed consent, the patient taken PV lab. The patient was supine position the Angio table. Both groins were prepped and draped in usual sterile fashion. Ultrasound was used to locate the left common femoral artery as well as the femoral bifurcation. Local anesthesia was inferior over the left common femoral artery and an introducer needle was used to cannulate the left common femoral artery without difficulty. An 035 versacore wire was inserted up the abdominal aorta under fluoroscopic guidance. A 5 French sheath was placed over the guidewire and the left common femoral artery. This was thoroughly flushed with heparinized saline. A 5 French pigtail catheter was advanced over the guidewire into the abdominal aorta and abdominal aortogram was obtained in AP projection. The left and right  renal arteries are patent. The infrarenal abdominal aorta is patent without stenosis. The left and right common external and internal iliac arteries are all patent. Next the Catheters pulled down just above the aortic bifurcation and bilateral extremity runoff views were performed through the pigtail catheter.  In the left lower extremity, the left common femoral profunda femoris and superficial femoral popliteal anterior tibial posterior tibial and peroneal arteries are all widely patent.  In the right lower extremity, the vessels were not very well opacified distally. So at this point I removed the pigtail catheter over a guidewire and the right common iliac artery was selected with a crossover catheter. An 035 angled Glidewire was then advanced into the distal right external iliac artery. The Crosser catheter was removed and exchanged for a 5 French straight catheter. Right arch the arteriogram was then obtained through the straight catheter.  In the right lower externa, the right common femoral and profunda is patent. There are not very many distal branches off the profunda however it does give off some collaterals. The native right superficial femoral artery is occluded. There is reconstitution faintly of what appears to be either the very distal popliteal artery or tibioperoneal trunk via collaterals from the profunda. There appears to be one-vessel runoff via a very faint small peroneal artery which gives off a posterior  communicating branch to the foot. The posterior tibial and anterior tibial arteries are occluded.  At this point the straight catheter was removed. The 5 French sheath was removed and hemostasis obtained with direct pressure.  Operative management: I will discuss the findings of the patient's angiogram with Dr. Trula Slade. Potentially the patient might be a candidate for a right common femoral to tibioperoneal trunk/peroneal bypass if she has adequate conduit. She also has tenuous  pulmonary status and may not be a candidate for a bypass. I will review the films further with Dr. Trula Slade and he will make a decision regarding her further management.  Assessment & Plan    1. Preoperative Cardiac Clearance for Right common femoral to tibioperoneal trunk/peroneal bypass - she has known PVD, requiring right femoral endarterectomy and femoral to above-knee popliteal bypass in 2014, known superficial femoral artery occlusion and occlusion of fem-pop bypass by imaging in 08/2016.  - she presented with acute onset right foot pain with color changes over the past few days with angiography performed by vascular today. The plan is for a tibioperoneal trunk/peroneal bypass early next week. - functional status is difficult to assess as she is not very active at home secondary to claudication and respiratory symptoms. EKG shows nonspecific ST changes. Discussed with Dr. Meda [place]. Will plan for a Lexiscan Myoview in the morning to assess for significant ischemia.   2. Persistent Atrial Fibrillation - HR remains well-controlled in the 70's - 80's. Continue Cardizem and Lopressor for rate-control. - Coumadin held for upcoming surgery. Being bridged with Heparin. Appreciate Pharmacy's assistance.   3. Chronic Diastolic CHF - echo in 83/1517 showed a preserved EF of 60-65%.  - she does not appear volume overloaded by physical examination today.   4. Stage 3 CKD - creatinine stable at 1.60. Follow closely following arteriogram earlier today with repeat BMET in the AM.   5. Polycythemia Vera - CBC shows WBC of 49.1, Hgb 9.5, platelets 1591.  6. Chronic Hypoxic Respiratory Failure - on 3L [place] at home. Unknown etiology as the patient denies any history of COPD or ILD.   Signed, Erma Heritage, PA-C 03/06/2017, 3:48 PM Pager: [phone]   The patient was seen, examined and discussed with Bernerd Pho, PA-C and I agree with the above.    A very pleasant 76 year old female with h/o  persistent atrial fibrillation (on Coumadin), chronic diastolic CHF, HTN, Stage 3 CKD, polycythemia vera, and PVD (s/p right femoral endarterectomy and femoral to above-knee popliteal bypass in 2014, known superficial femoral artery occlusion and occlusion of fem-pop bypass by imaging in 08/2016) who was admitted with acute ischemia of her RLE. Arteriogram done today showed right common femoral and profunda is patent with the native right superficial femoral artery being occluded. There is one-vessel runoff via a very faint small peroneal artery which gives off a posterior communicating branch to the foot. The posterior tibial and anterior tibial arteries are occluded. The plan is potentially for a right common femoral to tibioperoneal trunk/peroneal bypass early next week, therefore Cardiology is consulted for cardiac clearance.   The patient is minimally active sec to PAD and chronic respiratory failure, it is unclear why she is on home O2 since last year, she has never smoked and doesn't have a known lung disease. She is unable to walk more than 20 steps at the time (less than 4 METS).  She denies any prior chest pain, the last stress test done in 2015 was negative for ischemia.  Her  baseline ECG shows a-fib with PVCs and negative T waves in the inferolateral leads. This is not significantly changed from prior. The last echo in 11/2015 showed LVEF 60-65%, moderate pulmonary hypertension.  We will schedule a Lexiscan nuclear stress tomorrow. Unless there is a significant ischemia we would recommend to proceed with vascular surgery on Monday.  She doesn't have symptoms of CHF and has no known significant valvular abnormalities. She will be increased risk based on underlying respiratory failure, pulmonary hypertension and poor functional status.   Ena Dawley, MD 03/06/2017

## 2017-03-06 NOTE — Progress Notes (Signed)
ANTICOAGULATION CONSULT NOTE - Follow Up Consult  Pharmacy Consult for Heparin Bridging while Coumadin is held for upcoming surgery Indication: Afib/PVD  No Known Allergies  Patient Measurements: Height: 6' (182.9 cm) Weight: 154 lb 8.7 oz (70.1 kg) IBW/kg (Calculated) : 73.1  Vital Signs: Temp: 98.3 F (36.8 C) (08/03 1136) Temp Source: Oral (08/03 1136) BP: 106/54 (08/03 1615) Pulse Rate: 79 (08/03 1456)  Labs:  Recent Labs  03/04/17 2159 03/05/17 0224 03/06/17 0329 03/06/17 1142  HGB 10.0* 9.1* 9.5*  --   HCT 35.8* 33.1* 34.1*  --   PLT 1,620* 1,392* 1,591*  --   LABPROT 37.9* 39.5* 21.6* 18.2*  INR 3.74 3.93 1.85 1.49  CREATININE 1.39* 1.55* 1.60*  --     Estimated Creatinine Clearance: 34.1 mL/min (A) (by C-G formula based on SCr of 1.6 mg/dL (H)).   Assessment:  75 y/o F on warfarin PTA that was reversed with Vit K/FFP 8/2 and 8/3.  S/p abdominal aortogram with bilateral lower extremity runoff.  Patient may be a candidate for a right common femoral to tibioperoneal trunk/peroneal bypass.   Pharmacy to resume heparin at 9pm tonight for bridging while Coumadin is held for upcoming surgery. INR 1.49 today,    Goal of Therapy:  Heparin level 0.3-0.7 units/ml Monitor platelets by anticoagulation protocol: Yes   Plan:  -At 9 pm tonight, start heparin drip at 800 units/hr Check 8h heparin level Daily heparin level and CBC   Nicole Cella, RPh Clinical Pharmacist Pager: [phone] 03/06/2017,5:14 PM

## 2017-03-06 NOTE — Progress Notes (Signed)
PROGRESS NOTE    Charlotte [name]  FAO:[id number] DOB: Mar 25, 1941 DOA: 03/04/2017 PCP: [name], MD   Chief Complaint  Patient presents with  . Leg Pain    Brief Narrative:  76 year old female with history of arthritis, chronic atrial fibrillation, diastolic heart failure, hypertension, hypothyroidism, peripheral neuropathy, polycythemia vera, peripheral vascular disease presented with complaints of right lower extremity pain. Patient found to have ischemic limb, vascular surgery consulted and appreciated. Plan for arteriogram today.  Assessment & Plan   Right lower extremity pain/ischemic/severe PVD -Patient with history of right femoral-popliteal bypass 3-4 years ago -Currently on IV heparin -Patient with supratherapeutic INR, was given vitamin K for reversal -Vascular surgery consulted and appreciated plan for arteriogram today -Continue pain control  Chronic atrial fibrillation -CHADSVASC 7 -Patient was on warfarin at home, as above, patient was given vitamin K for reversal -INR currently 1.49 -Continue IV heparin -Continue diltiazem and metoprolol for rate control  Acute kidney injury on chronic kidney disease, stage III -Secondary to poor oral intake as well as torsemide -Torsemide discontinued, patient given IV fluids -Bump in creatinine to 1.6, baseline approximately 1.2-1.3 -Continue to monitor BMP  Essential hypertension -Continue metoprolol, diltiazem  Polycythemia vera -Continue Hydrea  Hyperlipidemia -Continue statin  Hypokalemia -Replaced, continue to monitor BMP  Hypothyroidism -Continue Synthroid  Chronic diastolic heart failure -Currently appears to be euvolemic -Torsemide and metolazone held -Continue to monitor intake and output, daily weights  Chronic respiratory failure -On home oxygen, currently stable  DVT Prophylaxis  Heparin  Code Status: Full  Family Communication: None at bedside  Disposition Plan: Admitted. Pending  vascular procedure today and further recommendations.   Consultants Vascular surgery  Procedures  None  Antibiotics   Anti-infectives    None      Subjective:   Charlotte [name] seen and examined today. Patient denies any chest pain, shortness of breath, abdominal pain, nausea vomiting, diarrhea or constipation. Does complain of right leg soreness and pain.  Objective:   Vitals:   03/06/17 1044 03/06/17 1045 03/06/17 1049 03/06/17 1136  BP: 100/67 100/67 (!) 100/59   Pulse: 64  66 84  Resp: 18 15 (!) 22   Temp: 98 F (36.7 C)  97.9 F (36.6 C) 98.3 F (36.8 C)  TempSrc: Oral  Oral Oral  SpO2: 90% 91% (!) 89%   Weight:      Height:        Intake/Output Summary (Last 24 hours) at 03/06/17 1253 Last data filed at 03/06/17 1137  Gross per 24 hour  Intake            775.8 ml  Output             1000 ml  Net           -224.2 ml   Filed Weights   03/04/17 2111  Weight: 69.9 kg (154 lb)    Exam  General: Well developed, well nourished, NAD, appears stated age  HEENT: NCAT, mucous membranes moist.   Cardiovascular: S1 S2 auscultated, Irregular  Respiratory: Clear to auscultation bilaterally with equal chest rise  Abdomen: Soft, nontender, nondistended, + bowel sounds  Extremities: warm dry without clubbing or edema. LE with discoloration. Absent pulses B/L feet.  Neuro: AAOx3, nonfocal  Psych: Normal affect and demeanor with intact judgement and insight   Data Reviewed: I have personally reviewed following labs and imaging studies  CBC:  Recent Labs Lab 03/03/17 1039 03/04/17 2159 03/05/17 0224 03/06/17 0329  WBC 43.5* 46.6* 44.8*  49.1*  NEUTROABS 39.2* 41.9* 39.0* 44.2*  HGB 10.6* 10.0* 9.1* 9.5*  HCT 37.9 35.8* 33.1* 34.1*  MCV 78.1 77.0* 75.9* 75.8*  PLT 1,234* 1,620* 1,392* 0,045*   Basic Metabolic Panel:  Recent Labs Lab 03/03/17 1039 03/04/17 2159 03/05/17 0224 03/06/17 0329  NA 137 140 137 134*  K 3.7 3.1* 2.8* 4.0  CL 97* 100*  99* 99*  CO2 29 26 29 27   GLUCOSE 103* 88 108* 101*  BUN 33* 39* 35* 34*  CREATININE 1.56* 1.39* 1.55* 1.60*  CALCIUM 8.7* 8.7* 8.2* 8.5*  MG  --   --  2.3  --    GFR: Estimated Creatinine Clearance: 34 mL/min (A) (by C-G formula based on SCr of 1.6 mg/dL (H)). Liver Function Tests:  Recent Labs Lab 03/03/17 1039 03/05/17 0224  AST 25 26  ALT 9* 11*  ALKPHOS 133* 126  BILITOT 1.1 1.0  PROT 6.7 5.9*  ALBUMIN 3.1* 2.8*   No results for input(s): LIPASE, AMYLASE in the last 168 hours. No results for input(s): AMMONIA in the last 168 hours. Coagulation Profile:  Recent Labs Lab 03/04/17 2159 03/05/17 0224 03/06/17 0329 03/06/17 1142  INR 3.74 3.93 1.85 1.49   Cardiac Enzymes: No results for input(s): CKTOTAL, CKMB, CKMBINDEX, TROPONINI in the last 168 hours. BNP (last 3 results) No results for input(s): PROBNP in the last 8760 hours. HbA1C: No results for input(s): HGBA1C in the last 72 hours. CBG: No results for input(s): GLUCAP in the last 168 hours. Lipid Profile: No results for input(s): CHOL, HDL, LDLCALC, TRIG, CHOLHDL, LDLDIRECT in the last 72 hours. Thyroid Function Tests: No results for input(s): TSH, T4TOTAL, FREET4, T3FREE, THYROIDAB in the last 72 hours. Anemia Panel: No results for input(s): VITAMINB12, FOLATE, FERRITIN, TIBC, IRON, RETICCTPCT in the last 72 hours. Urine analysis:    Component Value Date/Time   COLORURINE YELLOW 07/03/2016 1900   APPEARANCEUR CLEAR 07/03/2016 1900   LABSPEC 1.015 07/03/2016 1900   PHURINE 5.5 07/03/2016 1900   GLUCOSEU NEGATIVE 07/03/2016 1900   HGBUR NEGATIVE 07/03/2016 1900   BILIRUBINUR NEGATIVE 07/03/2016 1900   KETONESUR NEGATIVE 07/03/2016 1900   PROTEINUR NEGATIVE 07/03/2016 1900   UROBILINOGEN 1.0 09/14/2012 2337   NITRITE NEGATIVE 07/03/2016 1900   LEUKOCYTESUR SMALL (A) 07/03/2016 1900   Sepsis Labs: @LABRCNTIP (procalcitonin:4,lacticidven:4)  )No results found for this or any previous visit (from  the past 240 hour(s)).    Radiology Studies: No results found.   Scheduled Meds: . aspirin EC  81 mg Oral Daily  . diltiazem  60 mg Oral Q6H  . gabapentin  100 mg Oral TID  . guaiFENesin  600 mg Oral BID  . hydroxyurea  1,000 mg Oral Q breakfast  . levothyroxine  75 mcg Oral QAC breakfast  . metoprolol tartrate  50 mg Oral BID  . pantoprazole  40 mg Oral Daily  . potassium chloride SA  20 mEq Oral Daily  . pravastatin  10 mg Oral Daily   Continuous Infusions: . sodium chloride 75 mL/hr at 03/06/17 0825     LOS: 2 days   Time Spent in minutes   30 minutes  Khalea Ventura D.O. on 03/06/2017 at 12:53 PM  Between 7am to 7pm - Pager - [phone]  After 7pm go to [email or web address] - password [email or web address]  And look for the night coverage person covering for me after hours  [facility] Office  [phone]

## 2017-03-06 NOTE — H&P (View-Only) (Signed)
[facility] [place]  Patient name: Charlotte [name] MRN: [id number] DOB: 1940-09-10 Sex: female   REQUESTING PROVIDER:    ER   REASON FOR CONSULT:    Leg pain for several days  HISTORY OF PRESENT ILLNESS:   Charlotte [name] is a 76 y.o. female, who has a history of a right femoral endarterectomy and femoral to above knee popliteal bypass with 19mm Gortex for claudication by Dr. Kellie Simmering in 2014.  She was last seen by Dr. Donnetta Hutching in January of 2018.  At that time she came with an ultrasound that revealed an occluded bbypass.  She had no idea when her bypass occluded.  She did not have claudication, as her walking was limited by her pulmonary status. Her ABI at that time was 0.7.  Her ABI prior to her bypass surgery was 0.54.  She has a history of peripheral neuropathy as well as leg swelling.  She was diagnosed with venous hypertension.  She had been treated for ulceration, however with compression and wound care, her wounds have healed.  Since about Sunday, she has been complaining of worsening pain in her right leg which is somewhat discolored.  She caem to the [facility] for evaluation.  SHe has a history of AFIB, on coumadin.  She is followed by Dr. Bronson Ing for acute on chronic diastolic heart failure with an EF of 60% in 2017.  She is medically managed for hypertension.  She is a non smoker, but is on home O2.  She also has polycythemia vera.She is very hard of hearing  PAST MEDICAL HISTORY    Past Medical History:  Diagnosis Date  . Arthritis   . Atrial fibrillation ([place])   . Atrial fibrillation, chronic ([place])   . CHF (congestive heart failure) ([place])   . Deaf   . Diastolic heart failure ([place])   . DVT (deep venous thrombosis) ([place])   . Essential hypertension   . Hypothyroidism   . On home O2   . Peripheral neuropathy   . Peripheral vascular disease ([place])   . 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 Ulcer of ankle  ([place])    Bilateral 2015  . Varicose veins      FAMILY HISTORY   Family History  Problem Relation Age of Onset  . Heart failure Mother   . Heart disease Mother   . Stroke Father   . Heart disease Sister   . Diabetes Son   . Hypertension Sister   . Hypertension Sister   . Stroke Sister     SOCIAL HISTORY:   Social History   Social History  . Marital status: Married    Spouse name: N/A  . Number of children: N/A  . Years of education: N/A   Occupational History  . Not on file.   Social History Main Topics  . Smoking status: Never Smoker  . Smokeless tobacco: Never Used  . Alcohol use No  . Drug use: No  . Sexual activity: Not Currently   Other Topics Concern  . Not on file   Social History Narrative  . No narrative on file    ALLERGIES:    No Known Allergies  CURRENT MEDICATIONS:    Current Facility-Administered Medications  Medication Dose Route Frequency Provider Last Rate Last Dose  . morphine 4 MG/ML injection 4 mg  4 mg Intravenous Once Long, Wonda Olds, MD       Current Outpatient Prescriptions  Medication Sig Dispense Refill  .  aspirin EC 81 MG tablet Take 81 mg by mouth daily.    . benzonatate (TESSALON) 100 MG capsule Take 1 capsule (100 mg total) by mouth 3 (three) times daily as needed for cough. 20 capsule 0  . CARTIA XT 240 MG 24 hr capsule TAKE ONE CAPSULE BY MOUTH ONCE DAILY 90 capsule 3  . gabapentin (NEURONTIN) 100 MG capsule Take 100 mg by mouth 3 (three) times daily.      Marland Kitchen guaiFENesin (MUCINEX) 600 MG 12 hr tablet Take 1 tablet (600 mg total) by mouth 2 (two) times daily. 30 tablet 0  . hydroxyurea (HYDREA) 500 MG capsule Take 2 capsules (1,000 mg total) by mouth daily. May take with food to minimize GI side effects. 60 capsule 2  . levothyroxine (SYNTHROID, LEVOTHROID) 75 MCG tablet Take 75 mcg by mouth daily before breakfast.     . metolazone (ZAROXOLYN) 2.5 MG tablet Take 2.5 mg if wt gain is 3 lbs or more in 24 hrs (Patient taking  differently: Take 2.5 mg by mouth daily as needed (for fluid). Take 2.5 mg if wt gain is 3 lbs or more in 24 hrs) 30 tablet 3  . metoprolol (LOPRESSOR) 100 MG tablet Take 100 mg by mouth 2 (two) times daily.    . Potassium Chloride ER 20 MEQ TBCR Take 20 mEq by mouth daily. 30 tablet 1  . pravastatin (PRAVACHOL) 20 MG tablet Take 10 mg by mouth daily.     Marland Kitchen torsemide (DEMADEX) 20 MG tablet Take 2 tablets (40 mg total) by mouth daily. 180 tablet 3  . warfarin (COUMADIN) 5 MG tablet Take 1 tablet daily except 1/2 tablet on Mondays, Wednesdays and Fridays (Patient taking differently: Take 2.5-5 mg by mouth See admin instructions. Take 1 tablet daily except 1/2 tablet on Mondays, Wednesdays, Fridays, and Saturdays) 90 tablet 4    REVIEW OF SYSTEMS:   [X]  denotes positive finding, [ ]  denotes negative finding Cardiac  Comments:  Chest pain or chest pressure:    Shortness of breath upon exertion:    Short of breath when lying flat:    Irregular heart rhythm:        Vascular    Pain in calf, thigh, or hip brought on by ambulation: x   Pain in feet at night that wakes you up from your sleep:  x   Blood clot in your veins:    Leg swelling:         Pulmonary    Oxygen at home:    Productive cough:     Wheezing:         Neurologic    Sudden weakness in arms or legs:     Sudden numbness in arms or legs:     Sudden onset of difficulty speaking or slurred speech:    Temporary loss of vision in one eye:     Problems with dizziness:         Gastrointestinal    Blood in stool:      Vomited blood:         Genitourinary    Burning when urinating:     Blood in urine:        Psychiatric    Major depression:         Hematologic    Bleeding problems:    Problems with blood clotting too easily:        Skin    Rashes or ulcers:        Constitutional  Fever or chills:     PHYSICAL EXAM:   Vitals:   03/04/17 2111  BP: 117/71  Pulse: 89  Resp: (!) 22  Temp: 97.8 F (36.6 C)    TempSrc: Oral  SpO2: 96%  Weight: 154 lb (69.9 kg)    GENERAL: The patient is a well-nourished female, in no acute distress. The vital signs are documented above. CARDIAC: There is a regular rate and rhythm.  VASCULAR: Faint dorsalis pedis Doppler signal on the right leg PULMONARY: Nonlabored respirations ABDOMEN: Soft and non-tender with normal pitched bowel sounds.  MUSCULOSKELETAL: There are no major deformities or cyanosis. NEUROLOGIC: Chronic sensation loss in the right foot.  Motor function appears intact. SKIN: There are no ulcers or rashes noted. PSYCHIATRIC: The patient has a normal affect.  STUDIES:   I have reviewed her old angiogram where she had right femoral artery occlusion, along with SFA occlusion with reconstitution of the distal SFA.  Dr. Evelena Leyden op-note states that he took care not to damage the saphenous vein  Old venous u/s show a DVT in the femoral and popliteal veins  ASSESSMENT and PLAN   Rest pain, right leg: The patient is going to need an arteriogram to define her anatomy.  Her bypass graft is known to be occluded.  I would like to do for arteriogram Friday, however she is on Coumadin with a supratherapeutic INR.  I would like to try and correct her INR today with FFP so that she can have an arteriogram tomorrow.  All attempts at percutaneous revascularization will be tried tomorrow at angiography.  She will need to be nothing by mouth after midnight.   Annamarie Major, MD [facility] [phone] Pager [phone]

## 2017-03-06 NOTE — Interval H&P Note (Signed)
History and Physical Interval Note:  03/06/2017 1:40 PM  Charlotte [name]  has presented today for surgery, with the diagnosis of pvd w/ right leg rest pain  The various methods of treatment have been discussed with the patient and family. After consideration of risks, benefits and other options for treatment, the patient has consented to  Procedure(s): ABDOMINAL AORTOGRAM W/LOWER EXTREMITY (N/A) as a surgical intervention .  The patient's history has been reviewed, patient examined, no change in status, stable for surgery.  I have reviewed the patient's chart and labs.  Questions were answered to the patient's satisfaction.     Ruta Hinds

## 2017-03-07 ENCOUNTER — Inpatient Hospital Stay (HOSPITAL_COMMUNITY): Payer: Medicare Other

## 2017-03-07 DIAGNOSIS — Z01810 Encounter for preprocedural cardiovascular examination: Secondary | ICD-10-CM

## 2017-03-07 DIAGNOSIS — E038 Other specified hypothyroidism: Secondary | ICD-10-CM

## 2017-03-07 DIAGNOSIS — I481 Persistent atrial fibrillation: Secondary | ICD-10-CM

## 2017-03-07 LAB — BASIC METABOLIC PANEL
Anion gap: 8 (ref 5–15)
BUN: 37 mg/dL — ABNORMAL HIGH (ref 6–20)
CALCIUM: 8.4 mg/dL — AB (ref 8.9–10.3)
CO2: 26 mmol/L (ref 22–32)
CREATININE: 1.73 mg/dL — AB (ref 0.44–1.00)
Chloride: 103 mmol/L (ref 101–111)
GFR, EST AFRICAN AMERICAN: 32 mL/min — AB (ref >60)
GFR, EST NON AFRICAN AMERICAN: 28 mL/min — AB (ref >60)
Glucose, Bld: 99 mg/dL (ref 65–99)
POTASSIUM: 3.8 mmol/L (ref 3.5–5.1)
SODIUM: 137 mmol/L (ref 135–145)

## 2017-03-07 LAB — CBC WITH DIFFERENTIAL/PLATELET
BAND NEUTROPHILS: 0 %
BASOS ABS: 0 10*3/uL (ref 0.0–0.1)
BASOS PCT: 0 %
BLASTS: 0 %
EOS ABS: 0.9 10*3/uL — AB (ref 0.0–0.7)
Eosinophils Relative: 2 %
HEMATOCRIT: 33.9 % — AB (ref 36.0–46.0)
HEMOGLOBIN: 9.7 g/dL — AB (ref 12.0–15.0)
LYMPHS PCT: 1 %
Lymphs Abs: 0.4 10*3/uL — ABNORMAL LOW (ref 0.7–4.0)
MCH: 21.9 pg — ABNORMAL LOW (ref 26.0–34.0)
MCHC: 28.6 g/dL — ABNORMAL LOW (ref 30.0–36.0)
MCV: 76.5 fL — ABNORMAL LOW (ref 78.0–100.0)
MYELOCYTES: 0 %
Metamyelocytes Relative: 0 %
Monocytes Absolute: 1.7 10*3/uL — ABNORMAL HIGH (ref 0.1–1.0)
Monocytes Relative: 4 %
Neutro Abs: 39.8 10*3/uL — ABNORMAL HIGH (ref 1.7–7.7)
Neutrophils Relative %: 93 %
PROMYELOCYTES ABS: 0 %
Platelets: 1854 10*3/uL (ref 150–400)
RBC: 4.43 MIL/uL (ref 3.87–5.11)
RDW: 26.3 % — ABNORMAL HIGH (ref 11.5–15.5)
WBC: 42.8 10*3/uL — ABNORMAL HIGH (ref 4.0–10.5)
nRBC: 0 /100 WBC

## 2017-03-07 LAB — PREPARE FRESH FROZEN PLASMA
UNIT DIVISION: 0
Unit division: 0

## 2017-03-07 LAB — BPAM FFP
BLOOD PRODUCT EXPIRATION DATE: 201808082359
BLOOD PRODUCT EXPIRATION DATE: 201808082359
ISSUE DATE / TIME: 201808030941
ISSUE DATE / TIME: 201808031023
UNIT TYPE AND RH: 6200
Unit Type and Rh: 6200

## 2017-03-07 LAB — HEPARIN LEVEL (UNFRACTIONATED)
Heparin Unfractionated: <0.1 IU/mL — ABNORMAL LOW (ref 0.30–0.70)
Heparin Unfractionated: <0.1 IU/mL — ABNORMAL LOW (ref 0.30–0.70)

## 2017-03-07 LAB — NM MYOCAR MULTI W/SPECT W/WALL MOTION / EF
CHL CUP MPHR: 146 {beats}/min
CSEPED: 5 min
CSEPHR: 64 %
CSEPPHR: 94 {beats}/min
Estimated workload: 1 METS
Rest HR: 82 {beats}/min

## 2017-03-07 LAB — PROTIME-INR
INR: 1.54
Prothrombin Time: 18.6 seconds — ABNORMAL HIGH (ref 11.4–15.2)

## 2017-03-07 MED ORDER — TECHNETIUM TC 99M TETROFOSMIN IV KIT
10.0000 | PACK | Freq: Once | INTRAVENOUS | Status: AC | PRN
  Administered 2017-03-07: 10 via INTRAVENOUS

## 2017-03-07 MED ORDER — REGADENOSON 0.4 MG/5ML IV SOLN
INTRAVENOUS | Status: AC
  Filled 2017-03-07: qty 5

## 2017-03-07 MED ORDER — TECHNETIUM TC 99M TETROFOSMIN IV KIT
30.0000 | PACK | Freq: Once | INTRAVENOUS | Status: AC
  Administered 2017-03-07: 30 via INTRAVENOUS

## 2017-03-07 MED ORDER — REGADENOSON 0.4 MG/5ML IV SOLN
0.4000 mg | Freq: Once | INTRAVENOUS | Status: AC
  Administered 2017-03-07: 0.4 mg via INTRAVENOUS

## 2017-03-07 MED ORDER — ONDANSETRON HCL 4 MG/2ML IJ SOLN
INTRAMUSCULAR | Status: AC
  Administered 2017-03-07: 4 mg via INTRAVENOUS
  Filled 2017-03-07: qty 2

## 2017-03-07 NOTE — Progress Notes (Signed)
ANTICOAGULATION CONSULT NOTE  Pharmacy Consult for Heparin  Indication: Afib/PVD  No Known Allergies  Patient Measurements: Height: 6' (182.9 cm) Weight: 157 lb 11.2 oz (71.5 kg) IBW/kg (Calculated) : 73.1  Vital Signs: Temp: 98.3 F (36.8 C) (08/04 0513) Temp Source: Oral (08/04 0513) BP: 101/64 (08/04 0513) Pulse Rate: 73 (08/04 0513)  Labs:  Recent Labs  03/05/17 0224 03/06/17 0329 03/06/17 1142 03/07/17 0443  HGB 9.1* 9.5*  --  9.7*  HCT 33.1* 34.1*  --  33.9*  PLT 1,392* 1,591*  --  1,854*  LABPROT 39.5* 21.6* 18.2* 18.6*  INR 3.93 1.85 1.49 1.54  HEPARINUNFRC  --   --   --  <0.10*  CREATININE 1.55* 1.60*  --  1.73*    Estimated Creatinine Clearance: 32.2 mL/min (A) (by C-G formula based on SCr of 1.73 mg/dL (H)).   Assessment:  76 y/o Female with h/o Afib, Coumadin on hold, for heparin   Goal of Therapy:  Heparin level 0.3-0.7 units/ml Monitor platelets by anticoagulation protocol: Yes   Plan:  Increase Heparin 1000 units/hr Check heparin level in 8 hours.   Phillis Knack, PharmD, BCPS   03/07/2017,6:03 AM

## 2017-03-07 NOTE — Progress Notes (Signed)
PROGRESS NOTE    Charlotte [name]  TLX:[id number] DOB: [birth year] DOA: 03/04/2017 PCP: [name], MD    Brief Narrative: 76 year old female with history of arthritis, chronic atrial fibrillation, diastolic heart failure, hypertension, hypothyroidism, peripheral neuropathy, polycythemia vera, peripheral vascular disease presented with complaints of right lower extremity pain. Patient found to have ischemic limb, vascular surgery consulted and appreciated. Patient underwent abdominal aortogram with bilateral lower extremity run off. She was found ot have right leg occlusion of the right superficial femoral popliteal posterior tibial and anterior tibial arteries and reconstitution of diminutive peroneal artery. Plan for a right common femoral to tibioperoneal trunk/peroneal bypass once she is cleared by cardiology and with adequate vein conduit. Vein mapping to be done. She is also scheduled for nuclear stress test today.    Assessment & Plan:   Principal Problem:   Lower extremity arterial insufficiency, severe, right (HCC) Active Problems:   Polycythemia vera (HCC)   Chronic atrial fibrillation (HCC)   HTN (hypertension)   Hypothyroidism   Peripheral vascular disease (HCC)   Thrombocytosis (HCC)   Hyperlipidemia   Right lower extremity ischemia secondary to severe PAD and right leg occlusion of the right superficial femoral, popliteal, posterior and anterior tibial arteries.  Started on IV heparin , vascular surgery consulted and underwent abdominal aortogram showing the above.  Plan for right common femoral to tibioperoneal trunk/peroneal bypass , with adequate vein conduit after cardiology clearance.  Pain control.     chronic atrial fibrillation:  Rate controlled, with metoprolol and cardizem.  Was on coumadin at home, transitioned to IV heparin for the surgery.  INR subtherapeutic at 1.54   Acute kidney injury sec to CKD stage 3.  Possibly from torsemide and hypoperfusion.   Gentle hydration and repeat renal parameters in am.   Hypothyroidism: resume synthroid.   Hypertension; well controlled. Resume cardizem and metoprolol, prn hydralazine.    Polycythemia Vera:  Resume hydroxyurea.  Hemoglobin stable around 9.  No signs of bleeding.       DVT prophylaxis: lovenox.  Code Status: full code.  Family Communication: none at bedside.  Disposition Plan: pending further evaluation.   Consultants:   Cardiology  Vascular surgery.    Procedures: NST    Antimicrobials: none.    Subjective: No new complaints.   Objective: Vitals:   03/07/17 0846 03/07/17 1046 03/07/17 1113 03/07/17 1114  BP: 103/61 (!) 168/94 122/65 106/63  Pulse: 78 76 76 80  Resp:      Temp: 98 F (36.7 C)     TempSrc: Oral     SpO2:      Weight:      Height:        Intake/Output Summary (Last 24 hours) at 03/07/17 1223 Last data filed at 03/07/17 0900  Gross per 24 hour  Intake            924.5 ml  Output              300 ml  Net            624.5 ml   Filed Weights   03/04/17 2111 03/06/17 1300 03/07/17 0513  Weight: 69.9 kg (154 lb) 70.1 kg (154 lb 8.7 oz) 71.5 kg (157 lb 11.2 oz)    Examination:  General exam: Appears calm and comfortable  Respiratory system: Clear to auscultation. Respiratory effort normal. Cardiovascular system: S1 & S2 heard, RRR. No JVD, murmurs, rubs, gallops or clicks. No pedal edema. Gastrointestinal system: Abdomen is nondistended, soft  and nontender. No organomegaly or masses felt. Normal bowel sounds heard. Central nervous system: Alert and oriented. No focal neurological deficits. Extremities: Symmetric 5 x 5 power. Skin: No rashes, lesions or ulcers Psychiatry: Judgement and insight appear normal. Mood & affect appropriate.     Data Reviewed: I have personally reviewed following labs and imaging studies  CBC:  Recent Labs Lab 03/03/17 1039 03/04/17 2159 03/05/17 0224 03/06/17 0329 03/07/17 0443  WBC 43.5*  46.6* 44.8* 49.1* 42.8*  NEUTROABS 39.2* 41.9* 39.0* 44.2* 39.8*  HGB 10.6* 10.0* 9.1* 9.5* 9.7*  HCT 37.9 35.8* 33.1* 34.1* 33.9*  MCV 78.1 77.0* 75.9* 75.8* 76.5*  PLT 1,234* 1,620* 1,392* 1,591* 8,756*   Basic Metabolic Panel:  Recent Labs Lab 03/03/17 1039 03/04/17 2159 03/05/17 0224 03/06/17 0329 03/07/17 0443  NA 137 140 137 134* 137  K 3.7 3.1* 2.8* 4.0 3.8  CL 97* 100* 99* 99* 103  CO2 29 26 29 27 26   GLUCOSE 103* 88 108* 101* 99  BUN 33* 39* 35* 34* 37*  CREATININE 1.56* 1.39* 1.55* 1.60* 1.73*  CALCIUM 8.7* 8.7* 8.2* 8.5* 8.4*  MG  --   --  2.3  --   --    GFR: Estimated Creatinine Clearance: 32.2 mL/min (A) (by C-G formula based on SCr of 1.73 mg/dL (H)). Liver Function Tests:  Recent Labs Lab 03/03/17 1039 03/05/17 0224  AST 25 26  ALT 9* 11*  ALKPHOS 133* 126  BILITOT 1.1 1.0  PROT 6.7 5.9*  ALBUMIN 3.1* 2.8*   No results for input(s): LIPASE, AMYLASE in the last 168 hours. No results for input(s): AMMONIA in the last 168 hours. Coagulation Profile:  Recent Labs Lab 03/04/17 2159 03/05/17 0224 03/06/17 0329 03/06/17 1142 03/07/17 0443  INR 3.74 3.93 1.85 1.49 1.54   Cardiac Enzymes: No results for input(s): CKTOTAL, CKMB, CKMBINDEX, TROPONINI in the last 168 hours. BNP (last 3 results) No results for input(s): PROBNP in the last 8760 hours. HbA1C: No results for input(s): HGBA1C in the last 72 hours. CBG: No results for input(s): GLUCAP in the last 168 hours. Lipid Profile: No results for input(s): CHOL, HDL, LDLCALC, TRIG, CHOLHDL, LDLDIRECT in the last 72 hours. Thyroid Function Tests: No results for input(s): TSH, T4TOTAL, FREET4, T3FREE, THYROIDAB in the last 72 hours. Anemia Panel: No results for input(s): VITAMINB12, FOLATE, FERRITIN, TIBC, IRON, RETICCTPCT in the last 72 hours. Sepsis Labs: No results for input(s): PROCALCITON, LATICACIDVEN in the last 168 hours.  Recent Results (from the past 240 hour(s))  Surgical PCR  screen     Status: None   Collection Time: 03/06/17 12:54 PM  Result Value Ref Range Status   MRSA, PCR NEGATIVE NEGATIVE Final   Staphylococcus aureus NEGATIVE NEGATIVE Final    Comment:        The Xpert SA Assay (FDA approved for NASAL specimens in patients over 59 years of age), is one component of a comprehensive surveillance program.  Test performance has been validated by [facility] for patients greater than or equal to 62 year old. It is not intended to diagnose infection nor to guide or monitor treatment.          Radiology Studies: No results found.      Scheduled Meds: . aspirin EC  81 mg Oral Daily  . diltiazem  60 mg Oral Q6H  . gabapentin  100 mg Oral TID  . guaiFENesin  600 mg Oral BID  . hydroxyurea  1,000 mg Oral Q breakfast  .  levothyroxine  75 mcg Oral QAC breakfast  . metoprolol tartrate  50 mg Oral BID  . pantoprazole  40 mg Oral Daily  . potassium chloride SA  20 mEq Oral Daily  . pravastatin  10 mg Oral Daily  . regadenoson       Continuous Infusions: . sodium chloride 10 mL/hr at 03/06/17 2048  . sodium chloride    . heparin 1,000 Units/hr (03/07/17 0619)     LOS: 3 days    Time spent: 35 MINUTES.     Hosie Poisson, MD [facility] Pager [phone]  If 7PM-7AM, please contact night-coverage [email or web address] Password [facility] 03/07/2017, 12:23 PM

## 2017-03-07 NOTE — Progress Notes (Signed)
Progress Note  Patient Name: Charlotte [name] Date of Encounter: 03/07/2017  Primary Cardiologist: Dr. Bronson Ing   Subjective   Only complaint is right LE pain. No chest pain or increased dyspnea. Tolerated NST well.   Inpatient Medications    Scheduled Meds: . aspirin EC  81 mg Oral Daily  . diltiazem  60 mg Oral Q6H  . gabapentin  100 mg Oral TID  . guaiFENesin  600 mg Oral BID  . hydroxyurea  1,000 mg Oral Q breakfast  . levothyroxine  75 mcg Oral QAC breakfast  . metoprolol tartrate  50 mg Oral BID  . pantoprazole  40 mg Oral Daily  . potassium chloride SA  20 mEq Oral Daily  . pravastatin  10 mg Oral Daily  . regadenoson       Continuous Infusions: . sodium chloride 10 mL/hr at 03/06/17 2048  . sodium chloride    . heparin 1,000 Units/hr (03/07/17 0619)   PRN Meds: sodium chloride, acetaminophen **OR** acetaminophen, guaiFENesin-dextromethorphan, hydrALAZINE, labetalol, metoprolol tartrate, morphine injection, ondansetron **OR** ondansetron (ZOFRAN) IV, ondansetron, oxyCODONE, phenol   Vital Signs    Vitals:   03/07/17 0256 03/07/17 0307 03/07/17 0513 03/07/17 0846  BP:   101/64 103/61  Pulse:   73 78  Resp: (!) 22 (!) 22 20   Temp:   98.3 F (36.8 C) 98 F (36.7 C)  TempSrc:   Oral Oral  SpO2: (!) 89% 92% 92%   Weight:   157 lb 11.2 oz (71.5 kg)   Height:        Intake/Output Summary (Last 24 hours) at 03/07/17 1044 Last data filed at 03/07/17 0900  Gross per 24 hour  Intake           995.33 ml  Output              300 ml  Net           695.33 ml   Filed Weights   03/04/17 2111 03/06/17 1300 03/07/17 0513  Weight: 154 lb (69.9 kg) 154 lb 8.7 oz (70.1 kg) 157 lb 11.2 oz (71.5 kg)    Telemetry    Atrial fibrillation w/ CVR - Personally Reviewed  ECG    Atrial fibrillation - Personally Reviewed  Physical Exam   GEN: No acute distress.   Neck: No JVD Cardiac: irregularlly irregular, regular rate, no murmurs, rubs, or gallops.    Respiratory: Clear to auscultation bilaterally. GI: Soft, nontender, non-distended  MS: No edema; No deformity. Extremities:  decreased distal LE pulses, slight cyanosis of RLE digits Psych: Normal affect   Labs    Chemistry Recent Labs Lab 03/03/17 1039  03/05/17 0224 03/06/17 0329 03/07/17 0443  NA 137  < > 137 134* 137  K 3.7  < > 2.8* 4.0 3.8  CL 97*  < > 99* 99* 103  CO2 29  < > 29 27 26   GLUCOSE 103*  < > 108* 101* 99  BUN 33*  < > 35* 34* 37*  CREATININE 1.56*  < > 1.55* 1.60* 1.73*  CALCIUM 8.7*  < > 8.2* 8.5* 8.4*  PROT 6.7  --  5.9*  --   --   ALBUMIN 3.1*  --  2.8*  --   --   AST 25  --  26  --   --   ALT 9*  --  11*  --   --   ALKPHOS 133*  --  126  --   --  BILITOT 1.1  --  1.0  --   --   GFRNONAA 32*  < > 32* 31* 28*  GFRAA 37*  < > 37* 36* 32*  ANIONGAP 11  < > 9 8 8   < > = values in this interval not displayed.   Hematology Recent Labs Lab 03/05/17 0224 03/06/17 0329 03/07/17 0443  WBC 44.8* 49.1* 42.8*  RBC 4.36 4.50 4.43  HGB 9.1* 9.5* 9.7*  HCT 33.1* 34.1* 33.9*  MCV 75.9* 75.8* 76.5*  MCH 20.9* 21.1* 21.9*  MCHC 27.5* 27.9* 28.6*  RDW 25.3* 25.4* 26.3*  PLT 1,392* 1,591* 1,854*    Cardiac EnzymesNo results for input(s): TROPONINI in the last 168 hours. No results for input(s): TROPIPOC in the last 168 hours.   BNPNo results for input(s): BNP, PROBNP in the last 168 hours.   DDimer No results for input(s): DDIMER in the last 168 hours.   Radiology    No results found.  Cardiac Studies   NST 03/07/17 - pending   Patient Profile     Charlotte [name] is a 76 y.o. female with past medical history of persistent atrial fibrillation (on Coumadin), chronic diastolic CHF, chronic respiratory failure (on 3L [place] at baseline), HTN, Stage 3 CKD, polycythemia vera, and PVD (s/p right femoral endarterectomy and femoral to above-knee popliteal bypass in 2014, known superficial femoral artery occlusion and occlusion of fem-pop bypass by imaging in  08/2016) who is being seen  for the evaluation of preoperative cardiac clearance at the request of Dr. Trula Slade. Pt needs right common femoral to tibioperoneal trunk/peroneal bypass. Nuclear study being performed today as part of pre-op w/u.   Assessment & Plan    1. Pre-op Clearance: pt needing to undergo right common femoral to tibioperoneal trunk/peroneal bypass.  NST pending to r/o ischemia. If negative, she can be cleared for surgery.   2. Persistent Atrial Fibrillation:  Ventricular rates are well controlled on current regimen. Coumadin held for upcoming surgery. Being bridged with Heparin.  3. Chronic Diastolic CHF: echo 08/9620 showed normal EF at 60-65%. Volume status is stable.   4. Stage 2 CKD: slight bump in SCr overnight from 1.60>>1.73. Monitor.   5. Chronic Hypoxic Respiratory Failure: on chronic home O2.   6. Polycythemia Vera: per primary   Signed, Lyda Jester, PA-C  03/07/2017, 10:44 AM    Cardiology attending  Patient seen and examined. Agree with above. She has undergone stress testing. I have reviewed the findings. She has a low risk stress test. She can proceed with vascular surgery. Her biggest risk is with rapid VR in atrial fib leading to worsening CHF.   Mikle Bosworth.D.

## 2017-03-07 NOTE — Progress Notes (Signed)
Right Lower Extremity Vein Map    Right Great Saphenous Vein   Segment Diameter Comment  1. Origin 4.61mm   2. High Thigh 2.59mm branch  3. Mid Thigh 2mm branch  4. Low Thigh 19mm branch  5. At Knee 25mm branch  6. High Calf 3.80mm Multiple branches  7. Low Calf 3.51mm Multiple branches  8. Ankle 2.50mm    mm    mm    mm      Left Lower Extremity Vein Map    Left Great Saphenous Vein   Segment Diameter Comment  1. Origin 4.44mm   2. High Thigh 4.53mm   3. Mid Thigh 4.72mm   4. Low Thigh 3.79mm   5. At Knee 3.79mm   6. High Calf 4.51mm Multiple branches  7. Low Calf 67mm Multiple branches  8. Ankle 3.24mm Mutliple branches   mm    mm    mm

## 2017-03-07 NOTE — Progress Notes (Signed)
ANTICOAGULATION CONSULT NOTE  Pharmacy Consult for Heparin  Indication: Afib/PVD  No Known Allergies  Patient Measurements: Height: 6' (182.9 cm) Weight: 157 lb 11.2 oz (71.5 kg) IBW/kg (Calculated) : 73.1  Vital Signs: Temp: 98 F (36.7 C) (08/04 1325) Temp Source: Oral (08/04 1325) BP: 106/63 (08/04 1114) Pulse Rate: 80 (08/04 1114)  Labs:  Recent Labs  03/05/17 0224 03/06/17 0329 03/06/17 1142 03/07/17 0443 03/07/17 1416  HGB 9.1* 9.5*  --  9.7*  --   HCT 33.1* 34.1*  --  33.9*  --   PLT 1,392* 1,591*  --  1,854*  --   LABPROT 39.5* 21.6* 18.2* 18.6*  --   INR 3.93 1.85 1.49 1.54  --   HEPARINUNFRC  --   --   --  <0.10* <0.10*  CREATININE 1.55* 1.60*  --  1.73*  --     Estimated Creatinine Clearance: 32.2 mL/min (A) (by C-G formula based on SCr of 1.73 mg/dL (H)).   Assessment:  76 y/o Female with h/o Afib, Coumadin on hold for upcoming surgery. Patient is being bridged with heparin. Heparin level remains undetectable (<0.10) despite earlier rate increase. H/H stable. Platelets on the rise.  Per nurse no issues with the line. Patient was off of the floor for a stress test but heparin was running at 1000 units/hr when patient returned. No signs of bleeding noted.   Goal of Therapy:  Heparin level 0.3-0.7 units/ml Monitor platelets by anticoagulation protocol: Yes   Plan:  Increase Heparin to 1200 units/hr  Check heparin level in 8 hours.  Daily heparin level/CBC  Jimmy Footman, PharmD PGY2 Infectious Diseases Pharmacy Resident  Pager: [phone]  03/07/2017,3:29 PM

## 2017-03-07 NOTE — Progress Notes (Addendum)
  Progress Note  SUBJECTIVE:     Some pain right leg.   OBJECTIVE:   Vitals:   03/07/17 0307 03/07/17 0513  BP:  101/64  Pulse:  73  Resp: (!) 22 20  Temp:  98.3 F (36.8 C)    Intake/Output Summary (Last 24 hours) at 03/07/17 0843 Last data filed at 03/07/17 0630  Gross per 24 hour  Intake           1124.5 ml  Output              300 ml  Net            824.5 ml   Left groin without hematoma. Moving right toes. Peripheral neuropathy at baseline with minimal sensation right foot.  Unable to detect reliable peroneal signal right foot. Sounds mixed venous/arterial.   ASSESSMENT/PLAN:   76 y.o. female with rest pain right foot s/p: aortogram with bilateral runoff, has occlusion of right SFA, popliteal, posterior tibial and anterior tibial arteries with reconstitution of dimuinitive peroneal artery 1 Day Post-Op   Patient with history of chronically occluded right femoral to above knee bypass now with rest pain. Potentially candidate for right femoral to peroneal bypass pending cardiology clearance and adequate vein conduit. Vein mapping pending.  Appreciate cardiology assistance. For nuclear stress test today.   Alvia Grove 03/07/2017 8:43 AM -- LABS:   CBC    Component Value Date/Time   WBC 42.8 (H) 03/07/2017 0443   HGB 9.7 (L) 03/07/2017 0443   HCT 33.9 (L) 03/07/2017 0443   PLT 1,854 (HH) 03/07/2017 0443    BMET    Component Value Date/Time   NA 137 03/07/2017 0443   K 3.8 03/07/2017 0443   CL 103 03/07/2017 0443   CO2 26 03/07/2017 0443   GLUCOSE 99 03/07/2017 0443   BUN 37 (H) 03/07/2017 0443   CREATININE 1.73 (H) 03/07/2017 0443   CREATININE 1.21 (H) 08/14/2016 0733   CALCIUM 8.4 (L) 03/07/2017 0443   GFRNONAA 28 (L) 03/07/2017 0443   GFRAA 32 (L) 03/07/2017 0443    COAG Lab Results  Component Value Date   INR 1.54 03/07/2017   INR 1.49 03/06/2017   INR 1.85 03/06/2017   No results found for: PTT  ANTIBIOTICS:   Anti-infectives    None       Virgina Jock, PA-C [facility] Office: [phone] Pager: [phone] 03/07/2017 8:43 AM  I have interviewed the patient and examined the patient. I agree with the findings by the PA. Significant rest pain in right foot. I have encouraged to sit in chair with leg down as this will help. Unable to review films (they may not have been sent to server), however, Dr. Oneida Alar and Dr. Trula Slade reviewed films and felt that she might be a candidate for a redo bypass if she has a reasonable GSV. Vein map is pending. Cardiac preop eval in progress.   Gae Gallop, MD [phone]

## 2017-03-08 DIAGNOSIS — I998 Other disorder of circulatory system: Secondary | ICD-10-CM

## 2017-03-08 DIAGNOSIS — M79604 Pain in right leg: Secondary | ICD-10-CM

## 2017-03-08 LAB — BASIC METABOLIC PANEL
Anion gap: 13 (ref 5–15)
BUN: 44 mg/dL — ABNORMAL HIGH (ref 6–20)
CALCIUM: 8.6 mg/dL — AB (ref 8.9–10.3)
CO2: 22 mmol/L (ref 22–32)
CREATININE: 2.18 mg/dL — AB (ref 0.44–1.00)
Chloride: 98 mmol/L — ABNORMAL LOW (ref 101–111)
GFR calc Af Amer: 24 mL/min — ABNORMAL LOW (ref >60)
GFR calc non Af Amer: 21 mL/min — ABNORMAL LOW (ref >60)
GLUCOSE: 106 mg/dL — AB (ref 65–99)
Potassium: 4.6 mmol/L (ref 3.5–5.1)
Sodium: 133 mmol/L — ABNORMAL LOW (ref 135–145)

## 2017-03-08 LAB — CBC
HCT: 34.8 % — ABNORMAL LOW (ref 36.0–46.0)
Hemoglobin: 9.7 g/dL — ABNORMAL LOW (ref 12.0–15.0)
MCH: 21.3 pg — AB (ref 26.0–34.0)
MCHC: 27.9 g/dL — AB (ref 30.0–36.0)
MCV: 76.3 fL — ABNORMAL LOW (ref 78.0–100.0)
PLATELETS: 1948 10*3/uL — AB (ref 150–400)
RBC: 4.56 MIL/uL (ref 3.87–5.11)
RDW: 25.8 % — ABNORMAL HIGH (ref 11.5–15.5)
WBC: 37.5 10*3/uL — ABNORMAL HIGH (ref 4.0–10.5)

## 2017-03-08 LAB — HEPARIN LEVEL (UNFRACTIONATED)
HEPARIN UNFRACTIONATED: 0.24 [IU]/mL — AB (ref 0.30–0.70)
Heparin Unfractionated: 0.1 IU/mL — ABNORMAL LOW (ref 0.30–0.70)
Heparin Unfractionated: 0.18 IU/mL — ABNORMAL LOW (ref 0.30–0.70)

## 2017-03-08 LAB — PROTIME-INR
INR: 1.41
Prothrombin Time: 17.4 seconds — ABNORMAL HIGH (ref 11.4–15.2)

## 2017-03-08 MED ORDER — ONDANSETRON HCL 4 MG PO TABS: 4.0000 mg | ORAL_TABLET | Freq: Four times a day (QID) | ORAL | Status: DC | PRN

## 2017-03-08 MED ORDER — HEPARIN (PORCINE) IN NACL 100-0.45 UNIT/ML-% IJ SOLN
1850.0000 [IU]/h | INTRAMUSCULAR | Status: DC
  Administered 2017-03-08: 1500 [IU]/h via INTRAVENOUS
  Administered 2017-03-10: 1850 [IU]/h via INTRAVENOUS
  Administered 2017-03-10: 1700 [IU]/h via INTRAVENOUS
  Administered 2017-03-11 (×2): 1850 [IU]/h via INTRAVENOUS
  Filled 2017-03-08 (×5): qty 250

## 2017-03-08 MED ORDER — HYDROMORPHONE HCL 1 MG/ML IJ SOLN
1.0000 mg | Freq: Once | INTRAMUSCULAR | Status: AC
  Administered 2017-03-08: 1 mg via INTRAVENOUS
  Filled 2017-03-08: qty 1

## 2017-03-08 MED ORDER — ONDANSETRON HCL 4 MG/2ML IJ SOLN: 4.0000 mg | Freq: Four times a day (QID) | INTRAMUSCULAR | Status: DC | PRN

## 2017-03-08 NOTE — Progress Notes (Signed)
ANTICOAGULATION CONSULT NOTE  Pharmacy Consult for Heparin  Indication: Afib/PVD  No Known Allergies  Patient Measurements: Height: 6' (182.9 cm) Weight: 157 lb 11.2 oz (71.5 kg) IBW/kg (Calculated) : 73.1  Vital Signs: Temp: 98.5 F (36.9 C) (08/04 2053) Temp Source: Oral (08/04 2053) BP: 104/61 (08/04 2359) Pulse Rate: 88 (08/04 2053)  Labs:  Recent Labs  03/05/17 0224 03/06/17 0329 03/06/17 1142 03/07/17 0443 03/07/17 1416 03/08/17 0038  HGB 9.1* 9.5*  --  9.7*  --   --   HCT 33.1* 34.1*  --  33.9*  --   --   PLT 1,392* 1,591*  --  1,854*  --   --   LABPROT 39.5* 21.6* 18.2* 18.6*  --   --   INR 3.93 1.85 1.49 1.54  --   --   HEPARINUNFRC  --   --   --  <0.10* <0.10* 0.10*  CREATININE 1.55* 1.60*  --  1.73*  --   --     Estimated Creatinine Clearance: 32.2 mL/min (A) (by C-G formula based on SCr of 1.73 mg/dL (H)).   Assessment:  76 y/o Female with h/o Afib, Coumadin on hold, for heparin   Goal of Therapy:  Heparin level 0.3-0.7 units/ml Monitor platelets by anticoagulation protocol: Yes   Plan:  Increase Heparin 1450 units/hr Check heparin level in 8 hours.   Phillis Knack, PharmD, BCPS   03/08/2017,1:22 AM

## 2017-03-08 NOTE — Progress Notes (Signed)
ANTICOAGULATION CONSULT NOTE  Pharmacy Consult for Heparin  Indication: Afib/PVD  No Known Allergies  Patient Measurements: Height: 6' (182.9 cm) Weight: 149 lb 14.4 oz (68 kg) IBW/kg (Calculated) : 73.1  Vital Signs: Temp: 98 F (36.7 C) (08/05 1559) Temp Source: Oral (08/05 1559) BP: 120/63 (08/05 1722) Pulse Rate: 92 (08/05 1722)  Labs:  Recent Labs  03/06/17 0329 03/06/17 1142 03/07/17 0443  03/08/17 0038 03/08/17 0654 03/08/17 1857  HGB 9.5*  --  9.7*  --   --  9.7*  --   HCT 34.1*  --  33.9*  --   --  34.8*  --   PLT 1,591*  --  1,854*  --   --  1,948*  --   LABPROT 21.6* 18.2* 18.6*  --   --  17.4*  --   INR 1.85 1.49 1.54  --   --  1.41  --   HEPARINUNFRC  --   --  <0.10*  < > 0.10* 0.24* 0.18*  CREATININE 1.60*  --  1.73*  --   --  2.18*  --   < > = values in this interval not displayed.  Estimated Creatinine Clearance: 24.3 mL/min (A) (by C-G formula based on SCr of 2.18 mg/dL (H)).   Assessment:  76 y/o Female with h/o Afib, Coumadin on hold for upcoming surgery, currently on heparin. Pt had low risk stress test yesterday. Patient scheduled to have redo bypass. H/H stable; however, creatinine increased from 1.73 to 2.18 overnight.   Heparin level this evening is subtherapeutic despite increase to 1500 units/hr. H/H this AM was WNL. Spoke with nurse and no issues with line or bleeding noted.   Goal of Therapy:  Heparin level 0.3-0.7 units/ml Monitor platelets by anticoagulation protocol: Yes   Plan:  Increase heparin to 1700 units/hr  F/u daily heparin level, CBCs, s/sx bleeding Re-check heparin level with AM labs (~ 8 hours)   Jimmy Footman, PharmD PGY2 Infectious Diseases Pharmacy Resident Pager: [phone] 03/08/2017,7:56 PM

## 2017-03-08 NOTE — Progress Notes (Signed)
PROGRESS NOTE    Charlotte [name]  NTI:[id number] DOB: 1941-01-28 DOA: 03/04/2017 PCP: [name], MD    Brief Narrative: 76 year old female with history of arthritis, chronic atrial fibrillation, diastolic heart failure, hypertension, hypothyroidism, peripheral neuropathy, polycythemia vera, peripheral vascular disease presented with complaints of right lower extremity pain. Patient found to have ischemic limb, vascular surgery consulted and appreciated. Patient underwent abdominal aortogram with bilateral lower extremity run off. She was found ot have right leg occlusion of the right superficial femoral popliteal posterior tibial and anterior tibial arteries and reconstitution of diminutive peroneal artery. Plan for a right common femoral to tibioperoneal trunk/peroneal bypass once she is cleared by cardiology and with adequate vein conduit. Vein mapping to be done. She is also scheduled for nuclear stress test today.    Assessment & Plan:   Principal Problem:   Lower extremity arterial insufficiency, severe, right (HCC) Active Problems:   Polycythemia vera (HCC)   Chronic atrial fibrillation (HCC)   HTN (hypertension)   Hypothyroidism   Peripheral vascular disease (HCC)   Thrombocytosis (HCC)   Hyperlipidemia   Right lower extremity ischemia secondary to severe PAD and right leg occlusion of the right superficial femoral, popliteal, posterior and anterior tibial arteries.  Started on IV heparin , vascular surgery consulted and underwent abdominal aortogram showing the above.  Plan for right common femoral to tibioperoneal trunk/peroneal bypass , with adequate vein conduit . Pt is cleared from cardiology stand point to undergo the procedure.  Vein mapping done.  Awaiting recommendations from vascular.  Pain control.     chronic atrial fibrillation:  Rate controlled, with metoprolol and cardizem.  Was on coumadin at home, transitioned to IV heparin for the surgery.  INR  subtherapeutic.   Acute kidney injury sec to CKD stage 3.  Possibly from torsemide and hypoperfusion.  Gentle hydration and repeat renal parameters show a slight increase in the creatinine. Repeat bMP in am.   Hypothyroidism: resume synthroid.   Hypertension; well controlled. Resume cardizem and metoprolol, prn hydralazine.    Polycythemia Vera:  Resume hydroxyurea.  Hemoglobin stable around 9.  No signs of bleeding.  Leukocytosis improving.  Thrombocytosis is worsening. Will get hematology input in am regarding the hydroxy urea dose.       DVT prophylaxis: lovenox.  Code Status: full code.  Family Communication: none at bedside.  Disposition Plan: pending further evaluation.   Consultants:   Cardiology  Vascular surgery.    Procedures: NST    Antimicrobials: none.    Subjective: No new complaints. No chest pain or sob.  She was weaned downto her home oxygen at 4 lit/min.   Objective: Vitals:   03/08/17 0736 03/08/17 0900 03/08/17 1559 03/08/17 1722  BP: 99/67   120/63  Pulse: 76   92  Resp:      Temp: 97.6 F (36.4 C)  98 F (36.7 C)   TempSrc: Oral  Oral   SpO2:  90% 97%   Weight:      Height:        Intake/Output Summary (Last 24 hours) at 03/08/17 1938 Last data filed at 03/08/17 1800  Gross per 24 hour  Intake           734.25 ml  Output              850 ml  Net          -115.75 ml   Filed Weights   03/06/17 1300 03/07/17 0513 03/08/17 0434  Weight: 70.1  kg (154 lb 8.7 oz) 71.5 kg (157 lb 11.2 oz) 68 kg (149 lb 14.4 oz)    Examination:  General exam: Appears calm and comfortable  Respiratory system: Clear to auscultation. Respiratory effort normal. Cardiovascular system: S1 & S2 heard, RRR. No JVD, murmurs, rubs, gallops or clicks. No pedal edema. Gastrointestinal system: Abdomen is nondistended, soft and nontender. No organomegaly or masses felt. Normal bowel sounds heard. Central nervous system: Alert and oriented. No focal  neurological deficits. Extremities: Symmetric 5 x 5 power. Cold extremities more on the right when compared to left. Venous stasis changes bialteral lower extremities.  Skin: No rashes, lesions or ulcers Psychiatry: Judgement and insight appear normal. Mood & affect appropriate.     Data Reviewed: I have personally reviewed following labs and imaging studies  CBC:  Recent Labs Lab 03/03/17 1039 03/04/17 2159 03/05/17 0224 03/06/17 0329 03/07/17 0443 03/08/17 0654  WBC 43.5* 46.6* 44.8* 49.1* 42.8* 37.5*  NEUTROABS 39.2* 41.9* 39.0* 44.2* 39.8*  --   HGB 10.6* 10.0* 9.1* 9.5* 9.7* 9.7*  HCT 37.9 35.8* 33.1* 34.1* 33.9* 34.8*  MCV 78.1 77.0* 75.9* 75.8* 76.5* 76.3*  PLT 1,234* 1,620* 1,392* 1,591* 1,854* 1,610*   Basic Metabolic Panel:  Recent Labs Lab 03/04/17 2159 03/05/17 0224 03/06/17 0329 03/07/17 0443 03/08/17 0654  NA 140 137 134* 137 133*  K 3.1* 2.8* 4.0 3.8 4.6  CL 100* 99* 99* 103 98*  CO2 26 29 27 26 22   GLUCOSE 88 108* 101* 99 106*  BUN 39* 35* 34* 37* 44*  CREATININE 1.39* 1.55* 1.60* 1.73* 2.18*  CALCIUM 8.7* 8.2* 8.5* 8.4* 8.6*  MG  --  2.3  --   --   --    GFR: Estimated Creatinine Clearance: 24.3 mL/min (A) (by C-G formula based on SCr of 2.18 mg/dL (H)). Liver Function Tests:  Recent Labs Lab 03/03/17 1039 03/05/17 0224  AST 25 26  ALT 9* 11*  ALKPHOS 133* 126  BILITOT 1.1 1.0  PROT 6.7 5.9*  ALBUMIN 3.1* 2.8*   No results for input(s): LIPASE, AMYLASE in the last 168 hours. No results for input(s): AMMONIA in the last 168 hours. Coagulation Profile:  Recent Labs Lab 03/05/17 0224 03/06/17 0329 03/06/17 1142 03/07/17 0443 03/08/17 0654  INR 3.93 1.85 1.49 1.54 1.41   Cardiac Enzymes: No results for input(s): CKTOTAL, CKMB, CKMBINDEX, TROPONINI in the last 168 hours. BNP (last 3 results) No results for input(s): PROBNP in the last 8760 hours. HbA1C: No results for input(s): HGBA1C in the last 72 hours. CBG: No results for  input(s): GLUCAP in the last 168 hours. Lipid Profile: No results for input(s): CHOL, HDL, LDLCALC, TRIG, CHOLHDL, LDLDIRECT in the last 72 hours. Thyroid Function Tests: No results for input(s): TSH, T4TOTAL, FREET4, T3FREE, THYROIDAB in the last 72 hours. Anemia Panel: No results for input(s): VITAMINB12, FOLATE, FERRITIN, TIBC, IRON, RETICCTPCT in the last 72 hours. Sepsis Labs: No results for input(s): PROCALCITON, LATICACIDVEN in the last 168 hours.  Recent Results (from the past 240 hour(s))  Surgical PCR screen     Status: None   Collection Time: 03/06/17 12:54 PM  Result Value Ref Range Status   MRSA, PCR NEGATIVE NEGATIVE Final   Staphylococcus aureus NEGATIVE NEGATIVE Final    Comment:        The Xpert SA Assay (FDA approved for NASAL specimens in patients over 100 years of age), is one component of a comprehensive surveillance program.  Test performance has been validated by [facility] for patients greater than or equal to 47 year old. It is not intended to diagnose infection nor to guide or monitor treatment.          Radiology Studies: Nm Myocar Multi W/spect W/wall Motion / Ef  Result Date: 03/07/2017 CLINICAL DATA:  Atrial fibrillation. Hypertension and hyperlipidemia. EXAM: MYOCARDIAL IMAGING WITH SPECT (REST AND PHARMACOLOGIC-STRESS) GATED LEFT VENTRICULAR WALL MOTION STUDY LEFT VENTRICULAR EJECTION FRACTION TECHNIQUE: Standard myocardial SPECT imaging was performed after resting intravenous injection of 10 mCi Tc-15m tetrofosmin. Subsequently, intravenous infusion of Lexiscan was performed under the supervision of the Cardiology staff. At peak effect of the drug, 30 mCi Tc-48m tetrofosmin was injected intravenously and standard myocardial SPECT imaging was performed. Quantitative gated imaging was also performed to evaluate left ventricular wall motion, and estimate left ventricular ejection fraction. COMPARISON:  None. FINDINGS: Perfusion: No decreased activity  in the left ventricle on stress imaging to suggest reversible ischemia or infarction. Wall Motion: Normal left ventricular wall motion. No left ventricular dilation. Left Ventricular Ejection Fraction: 69 % End diastolic volume 88 ml End systolic volume 27 ml IMPRESSION: 1. No reversible ischemia or infarction. 2. Normal left ventricular wall motion. 3. Left ventricular ejection fraction 69% 4. Non invasive risk stratification*: Low *2012 Appropriate Use Criteria for Coronary Revascularization Focused Update: J Am Coll Cardiol. 7035;00(9):381-829. http://content.airportbarriers.com.aspx?articleid=[email or web address] Electronically Signed   By: Earle Gell M.D.   On: 03/07/2017 14:34        Scheduled Meds: . aspirin EC  81 mg Oral Daily  . diltiazem  60 mg Oral Q6H  . gabapentin  100 mg Oral TID  . guaiFENesin  600 mg Oral BID  . hydroxyurea  1,000 mg Oral Q breakfast  . levothyroxine  75 mcg Oral QAC breakfast  . metoprolol tartrate  50 mg Oral BID  . pantoprazole  40 mg Oral Daily  . potassium chloride SA  20 mEq Oral Daily  . pravastatin  10 mg Oral Daily   Continuous Infusions: . sodium chloride 10 mL/hr at 03/08/17 0552  . sodium chloride    . heparin 1,500 Units/hr (03/08/17 1557)     LOS: 4 days    Time spent: 35 MINUTES.     Hosie Poisson, MD [facility] Pager [phone]  If 7PM-7AM, please contact night-coverage [email or web address] Password [facility] 03/08/2017, 7:38 PM

## 2017-03-08 NOTE — Progress Notes (Signed)
ANTICOAGULATION CONSULT NOTE  Pharmacy Consult for Heparin  Indication: Afib/PVD  No Known Allergies  Patient Measurements: Height: 6' (182.9 cm) Weight: 149 lb 14.4 oz (68 kg) IBW/kg (Calculated) : 73.1  Vital Signs: Temp: 97.6 F (36.4 C) (08/05 0736) Temp Source: Oral (08/05 0736) BP: 99/67 (08/05 0736) Pulse Rate: 76 (08/05 0736)  Labs:  Recent Labs  03/06/17 0329 03/06/17 1142  03/07/17 0443 03/07/17 1416 03/08/17 0038 03/08/17 0654  HGB 9.5*  --   --  9.7*  --   --  9.7*  HCT 34.1*  --   --  33.9*  --   --  34.8*  PLT 1,591*  --   --  1,854*  --   --  1,948*  LABPROT 21.6* 18.2*  --  18.6*  --   --  17.4*  INR 1.85 1.49  --  1.54  --   --  1.41  HEPARINUNFRC  --   --   < > <0.10* <0.10* 0.10* 0.24*  CREATININE 1.60*  --   --  1.73*  --   --  2.18*  < > = values in this interval not displayed.  Estimated Creatinine Clearance: 24.3 mL/min (A) (by C-G formula based on SCr of 2.18 mg/dL (H)).   Assessment:  76 y/o Female with h/o Afib, Coumadin on hold for upcoming surgery, currently on heparin. Pt had low risk stress test yesterday. Patient scheduled to have redo bypass. H/H stable; however, creatinine increased from 1.73 to 2.18 overnight. No signs/sx bleeding and no issues per RN.    0700 HL 0.24 from undetectable this morning; level collected roughly 3 hours early, didn't start 1450 units/hr infusion until 0200, and drew level at 0700. Therefore, level may be even lower than shown.  Goal of Therapy:  Heparin level 0.3-0.7 units/ml Monitor platelets by anticoagulation protocol: Yes   Plan:  Increase heparin to 1500 units/hr because of early level. F/u daily heparin level, CBCs, s/sx bleeding Monitor renal function closely and make adjustments as necessary. Check heparin level in 8 hours.  Nida Boatman, PharmD PGY1 Acute Care Pharmacy Resident Pager: [phone] 03/08/2017,10:03 AM

## 2017-03-08 NOTE — Progress Notes (Addendum)
Progress Note  SUBJECTIVE:     Right foot painful but tolerable.   OBJECTIVE:   Vitals:   03/08/17 0629 03/08/17 0736  BP: (!) 101/59 99/67  Pulse:  76  Resp:    Temp:  97.6 F (36.4 C)    Intake/Output Summary (Last 24 hours) at 03/08/17 0912 Last data filed at 03/08/17 0552  Gross per 24 hour  Intake           254.25 ml  Output              500 ml  Net          -245.75 ml   Right foot with motor sensation intact.  Has diminished feeling right foot.   DATA  Vein mapping 03/07/17  Right Great Saphenous Vein  Segment Diameter Comment  1. Origin 4.12mm   2. High Thigh 2.14mm branch  3. Mid Thigh 61mm branch  4. Low Thigh 33mm branch  5. At Knee 7mm branch  6. High Calf 3.29mm Multiple branches  7. Low Calf 3.72mm Multiple branches  8. Ankle 2.47mm    Left Great Saphenous Vein   Segment Diameter Comment  1. Origin 4.35mm   2. High Thigh 4.46mm   3. Mid Thigh 4.12mm   4. Low Thigh 3.63mm   5. At Knee 3.95mm   6. High Calf 4.5mm Multiple branches  7. Low Calf 63mm Multiple branches  8. Ankle 3.68mm Mutliple      ASSESSMENT/PLAN:   76 y.o. female with rest pain right foot s/p: aortogram with bilateral runoff, has occlusion of right SFA, popliteal, posterior tibial and anterior tibial arteries with reconstitution of dimuinitive peroneal artery  Had low risk nuclear stress test yesterday. GSV vein mapping completed. Right GSV is smaller around (64mm) at the thigh. Left GSV is of better caliber .  Dr. Scot Dock to see patient and discuss timing of redo bypass.   Alvia Grove 03/08/2017 9:12 AM -- LABS:   CBC    Component Value Date/Time   WBC 37.5 (H) 03/08/2017 0654   HGB 9.7 (L) 03/08/2017 0654   HCT 34.8 (L) 03/08/2017 0654   PLT 1,948 (HH) 03/08/2017 0654    BMET    Component Value Date/Time   NA 133 (L) 03/08/2017 0654   K 4.6 03/08/2017 0654   CL 98 (L) 03/08/2017 0654   CO2 22 03/08/2017 0654   GLUCOSE 106 (H) 03/08/2017 0654   BUN 44  (H) 03/08/2017 0654   CREATININE 2.18 (H) 03/08/2017 0654   CREATININE 1.21 (H) 08/14/2016 0733   CALCIUM 8.6 (L) 03/08/2017 0654   GFRNONAA 21 (L) 03/08/2017 0654   GFRAA 24 (L) 03/08/2017 0654    COAG Lab Results  Component Value Date   INR 1.41 03/08/2017   INR 1.54 03/07/2017   INR 1.49 03/06/2017   No results found for: PTT  ANTIBIOTICS:   Anti-infectives    None       Virgina Jock, PA-C [facility] Office: [phone] Pager: [phone] 03/08/2017 9:12 AM  I have interviewed the patient and examined the patient. I agree with the findings by the PA. I have reviewed the vein map. The great saphenous vein is small in the mid and distal thigh. I was unable to review her angiogram as the films have not been sent to the server. Cardiology has cleared the patient for surgery. I've explained to the patient that Dr. Donnetta Hutching will review the arteriogram and vein map tomorrow and decide if  she is a candidate for a bypass. If she is not felt to be a good candidate for bypass she may be able to get by as she states that her rest pain is tolerable. If she is not a candidate for bypass and her pain becomes intolerable then she would require a below the knee amputation. I've discussed this with her and her husband.  Gae Gallop, MD [phone]

## 2017-03-09 ENCOUNTER — Inpatient Hospital Stay (HOSPITAL_COMMUNITY): Payer: Medicare Other

## 2017-03-09 LAB — BASIC METABOLIC PANEL
ANION GAP: 9 (ref 5–15)
Anion gap: 12 (ref 5–15)
BUN: 50 mg/dL — AB (ref 6–20)
BUN: 48 mg/dL — ABNORMAL HIGH (ref 6–20)
CALCIUM: 8.5 mg/dL — AB (ref 8.9–10.3)
CALCIUM: 8.2 mg/dL — AB (ref 8.9–10.3)
CO2: 20 mmol/L — ABNORMAL LOW (ref 22–32)
CO2: 20 mmol/L — AB (ref 22–32)
CREATININE: 2.74 mg/dL — AB (ref 0.44–1.00)
CREATININE: 2.51 mg/dL — AB (ref 0.44–1.00)
Chloride: 100 mmol/L — ABNORMAL LOW (ref 101–111)
Chloride: 101 mmol/L (ref 101–111)
GFR calc non Af Amer: 16 mL/min — ABNORMAL LOW (ref >60)
GFR calc non Af Amer: 18 mL/min — ABNORMAL LOW (ref >60)
GFR, EST AFRICAN AMERICAN: 19 mL/min — AB (ref >60)
GFR, EST AFRICAN AMERICAN: 21 mL/min — AB (ref >60)
Glucose, Bld: 97 mg/dL (ref 65–99)
Glucose, Bld: 91 mg/dL (ref 65–99)
Potassium: 6.3 mmol/L (ref 3.5–5.1)
Potassium: 4.3 mmol/L (ref 3.5–5.1)
SODIUM: 132 mmol/L — AB (ref 135–145)
Sodium: 130 mmol/L — ABNORMAL LOW (ref 135–145)

## 2017-03-09 LAB — URINALYSIS, ROUTINE W REFLEX MICROSCOPIC
BILIRUBIN URINE: NEGATIVE
Glucose, UA: NEGATIVE mg/dL
Ketones, ur: NEGATIVE mg/dL
LEUKOCYTES UA: NEGATIVE
NITRITE: NEGATIVE
PH: 5 (ref 5.0–8.0)
Protein, ur: NEGATIVE mg/dL
SPECIFIC GRAVITY, URINE: 1.021 (ref 1.005–1.030)

## 2017-03-09 LAB — GLUCOSE, RANDOM: Glucose, Bld: 69 mg/dL (ref 65–99)

## 2017-03-09 LAB — CBC
HCT: 34.3 % — ABNORMAL LOW (ref 36.0–46.0)
HEMOGLOBIN: 9.7 g/dL — AB (ref 12.0–15.0)
MCH: 21.5 pg — ABNORMAL LOW (ref 26.0–34.0)
MCHC: 28.3 g/dL — ABNORMAL LOW (ref 30.0–36.0)
MCV: 76.1 fL — ABNORMAL LOW (ref 78.0–100.0)
PLATELETS: 2065 10*3/uL — AB (ref 150–400)
RBC: 4.51 MIL/uL (ref 3.87–5.11)
RDW: 25.6 % — ABNORMAL HIGH (ref 11.5–15.5)
WBC: 33 10*3/uL — AB (ref 4.0–10.5)

## 2017-03-09 LAB — NA AND K (SODIUM & POTASSIUM), RAND UR
Potassium Urine: 55 mmol/L
Sodium, Ur: <10 mmol/L

## 2017-03-09 LAB — HEPARIN LEVEL (UNFRACTIONATED): HEPARIN UNFRACTIONATED: 0.34 [IU]/mL (ref 0.30–0.70)

## 2017-03-09 LAB — PROTIME-INR
INR: 1.5
PROTHROMBIN TIME: 18.3 s — AB (ref 11.4–15.2)

## 2017-03-09 LAB — GLUCOSE, CAPILLARY: GLUCOSE-CAPILLARY: 96 mg/dL (ref 65–99)

## 2017-03-09 LAB — POTASSIUM
POTASSIUM: 3.9 mmol/L (ref 3.5–5.1)
Potassium: 6.3 mmol/L (ref 3.5–5.1)
Potassium: 4.1 mmol/L (ref 3.5–5.1)

## 2017-03-09 MED ORDER — SODIUM POLYSTYRENE SULFONATE 15 GM/60ML PO SUSP
30.0000 g | Freq: Once | ORAL | Status: AC
  Administered 2017-03-09: 30 g via ORAL
  Filled 2017-03-09: qty 120

## 2017-03-09 MED ORDER — SODIUM CHLORIDE 0.9 % IV SOLN
INTRAVENOUS | Status: DC
  Administered 2017-03-09 – 2017-03-11 (×5): via INTRAVENOUS

## 2017-03-09 MED ORDER — SENNOSIDES-DOCUSATE SODIUM 8.6-50 MG PO TABS
1.0000 | ORAL_TABLET | Freq: Two times a day (BID) | ORAL | Status: DC
  Administered 2017-03-09 – 2017-03-10 (×3): 1 via ORAL
  Filled 2017-03-09 (×4): qty 1

## 2017-03-09 MED ORDER — SODIUM CHLORIDE 0.9 % IV BOLUS (SEPSIS)
1000.0000 mL | Freq: Once | INTRAVENOUS | Status: AC
  Administered 2017-03-09: 1000 mL via INTRAVENOUS

## 2017-03-09 MED ORDER — SODIUM POLYSTYRENE SULFONATE 15 GM/60ML PO SUSP
45.0000 g | Freq: Once | ORAL | Status: AC
  Administered 2017-03-09: 45 g via ORAL
  Filled 2017-03-09 (×2): qty 180

## 2017-03-09 MED ORDER — DEXTROSE 50 % IV SOLN
1.0000 | Freq: Once | INTRAVENOUS | Status: AC
  Administered 2017-03-09: 50 mL via INTRAVENOUS
  Filled 2017-03-09: qty 50

## 2017-03-09 MED ORDER — POLYETHYLENE GLYCOL 3350 17 G PO PACK
17.0000 g | PACK | Freq: Every day | ORAL | Status: DC
  Administered 2017-03-12 – 2017-03-18 (×3): 17 g via ORAL
  Filled 2017-03-09 (×5): qty 1

## 2017-03-09 MED ORDER — SODIUM CHLORIDE 0.9 % IV SOLN
1.0000 g | Freq: Once | INTRAVENOUS | Status: AC
  Administered 2017-03-09: 1 g via INTRAVENOUS
  Filled 2017-03-09: qty 10

## 2017-03-09 MED ORDER — INSULIN ASPART 100 UNIT/ML IV SOLN
10.0000 [IU] | Freq: Once | INTRAVENOUS | Status: AC
  Administered 2017-03-09: 10 [IU] via INTRAVENOUS

## 2017-03-09 NOTE — Progress Notes (Signed)
Progress Note  Patient Name: Charlotte [name] Date of Encounter: 03/09/2017  Primary Cardiologist: Dr Bronson Ing  Subjective   Complains of RLE pain; no chest pain or dyspnea  Inpatient Medications    Scheduled Meds: . aspirin EC  81 mg Oral Daily  . diltiazem  60 mg Oral Q6H  . gabapentin  100 mg Oral TID  . guaiFENesin  600 mg Oral BID  . hydroxyurea  1,000 mg Oral Q breakfast  . levothyroxine  75 mcg Oral QAC breakfast  . metoprolol tartrate  50 mg Oral BID  . pantoprazole  40 mg Oral Daily  . potassium chloride SA  20 mEq Oral Daily  . pravastatin  10 mg Oral Daily   Continuous Infusions: . sodium chloride 10 mL/hr at 03/08/17 0552  . sodium chloride    . heparin 1,700 Units/hr (03/08/17 2007)   PRN Meds: sodium chloride, acetaminophen **OR** acetaminophen, guaiFENesin-dextromethorphan, hydrALAZINE, labetalol, metoprolol tartrate, morphine injection, ondansetron **OR** ondansetron (ZOFRAN) IV, oxyCODONE, phenol   Vital Signs    Vitals:   03/08/17 1722 03/08/17 2020 03/09/17 0433 03/09/17 0800  BP: 120/63 122/74 114/61   Pulse: 92 (!) 107 79   Resp:      Temp:  98 F (36.7 C) (!) 97.5 F (36.4 C) 98 F (36.7 C)  TempSrc:  Oral Oral Oral  SpO2:    96%  Weight:   73.8 kg (162 lb 11.2 oz)   Height:        Intake/Output Summary (Last 24 hours) at 03/09/17 0917 Last data filed at 03/09/17 0800  Gross per 24 hour  Intake              360 ml  Output              850 ml  Net             -490 ml   Filed Weights   03/07/17 0513 03/08/17 0434 03/09/17 0433  Weight: 71.5 kg (157 lb 11.2 oz) 68 kg (149 lb 14.4 oz) 73.8 kg (162 lb 11.2 oz)    Telemetry    Atrial fibrillation; rate controlled- Personally Reviewed   Physical Exam   GEN: No acute distress.   Neck: No JVD Cardiac: irregular Respiratory: Mildly diminished BS bases GI: Soft, nontender, non-distended  MS: No edema Neuro:  Nonfocal  Psych: Normal affect   Labs    Chemistry Recent  Labs Lab 03/03/17 1039  03/05/17 0224 03/06/17 0329 03/07/17 0443 03/08/17 0654  NA 137  < > 137 134* 137 133*  K 3.7  < > 2.8* 4.0 3.8 4.6  CL 97*  < > 99* 99* 103 98*  CO2 29  < > 29 27 26 22   GLUCOSE 103*  < > 108* 101* 99 106*  BUN 33*  < > 35* 34* 37* 44*  CREATININE 1.56*  < > 1.55* 1.60* 1.73* 2.18*  CALCIUM 8.7*  < > 8.2* 8.5* 8.4* 8.6*  PROT 6.7  --  5.9*  --   --   --   ALBUMIN 3.1*  --  2.8*  --   --   --   AST 25  --  26  --   --   --   ALT 9*  --  11*  --   --   --   ALKPHOS 133*  --  126  --   --   --   BILITOT 1.1  --  1.0  --   --   --  GFRNONAA 32*  < > 32* 31* 28* 21*  GFRAA 37*  < > 37* 36* 32* 24*  ANIONGAP 11  < > 9 8 8 13   < > = values in this interval not displayed.   Hematology Recent Labs Lab 03/07/17 0443 03/08/17 0654 03/09/17 0254  WBC 42.8* 37.5* 33.0*  RBC 4.43 4.56 4.51  HGB 9.7* 9.7* 9.7*  HCT 33.9* 34.8* 34.3*  MCV 76.5* 76.3* 76.1*  MCH 21.9* 21.3* 21.5*  MCHC 28.6* 27.9* 28.3*  RDW 26.3* 25.8* 25.6*  PLT 1,854* 1,948* 2,065*    Radiology    Nm Myocar Multi W/spect W/wall Motion / Ef  Result Date: 03/07/2017 CLINICAL DATA:  Atrial fibrillation. Hypertension and hyperlipidemia. EXAM: MYOCARDIAL IMAGING WITH SPECT (REST AND PHARMACOLOGIC-STRESS) GATED LEFT VENTRICULAR WALL MOTION STUDY LEFT VENTRICULAR EJECTION FRACTION TECHNIQUE: Standard myocardial SPECT imaging was performed after resting intravenous injection of 10 mCi Tc-53m tetrofosmin. Subsequently, intravenous infusion of Lexiscan was performed under the supervision of the Cardiology staff. At peak effect of the drug, 30 mCi Tc-50m tetrofosmin was injected intravenously and standard myocardial SPECT imaging was performed. Quantitative gated imaging was also performed to evaluate left ventricular wall motion, and estimate left ventricular ejection fraction. COMPARISON:  None. FINDINGS: Perfusion: No decreased activity in the left ventricle on stress imaging to suggest reversible  ischemia or infarction. Wall Motion: Normal left ventricular wall motion. No left ventricular dilation. Left Ventricular Ejection Fraction: 69 % End diastolic volume 88 ml End systolic volume 27 ml IMPRESSION: 1. No reversible ischemia or infarction. 2. Normal left ventricular wall motion. 3. Left ventricular ejection fraction 69% 4. Non invasive risk stratification*: Low *2012 Appropriate Use Criteria for Coronary Revascularization Focused Update: J Am Coll Cardiol. 9323;55(7):322-025. http://content.airportbarriers.com.aspx?articleid=1201161 Electronically Signed   By: Earle Gell M.D.   On: 03/07/2017 14:34    Patient Profile     76 y.o. female Charlotte [name] a 76 y.o. femalewith past medical history of persistent atrial fibrillation (on Coumadin), chronic diastolic CHF, chronic respiratory failure (on 3L [place] at baseline),HTN, Stage 3 CKD, polycythemia vera, and PVD (s/p right femoral endarterectomy and femoral to above-knee popliteal bypass in 2014, known superficial femoral artery occlusion and occlusion of fem-pop bypass by imaging in 08/2016) who is being seen  for the evaluation of preoperative cardiac clearanceat the request of Dr. Trula Slade. Pt needs right common femoral to tibioperoneal trunk/peroneal bypass. Nuclear study shows no ischemia or infarction.  Assessment & Plan    1 preoperative evaluation prior to peripheral vascular surgery-nuclear study shows no ischemia or infarction and normal LV function. Patient may proceed with her surgery.  2 permanent atrial fibrillation-continue metoprolol and Cardizem for rate control. Her Coumadin is being held in anticipation of surgery. Continue heparin.  3 Vascular disease-management per vascular surgery. Final decision concerning revascularization pending.  4 chronic diastolic congestive heart failure-patient's diuretics are on hold. Patient appears to be euvolemic.  5 acute on chronic stage III kidney disease-renal function is  slightly worse. Follow closely. I will hold potassium as she is not receiving diuretics.  6 hypertension-blood pressure is controlled. Continue present medications.   Signed, Kirk Ruths, MD  03/09/2017, 9:17 AM

## 2017-03-09 NOTE — Progress Notes (Signed)
Patient ID: Charlotte [name], female   DOB: August 10, 1940, 76 y.o.   MRN: [id number] Reviewed the patient's arteriogram, examined the patient had a long discussion with the patient and HER-2 sons and husband. Very difficult management situation. Patient is currently ambulatory. She is having severe rest pain in her right leg. No motor or sensory loss currently. Do not feel that she could tolerate this level of pain. Explained her only option would be right femoral to peroneal bypass versus right above-knee amputation. Explained that she did have a small diseased distal site to the peroneal artery. It does appear that her tibial peroneal trunk may be patent. Also her vein is patent throughout its course but marginal and mid thigh with 2 mm size. I am hesitant to recommend primary amputation since she is a bypass candidate. Expanding that this would not be possible until later in the week. Can tentatively plan this for Thursday. She will continue her discussion with her family. Following with you

## 2017-03-09 NOTE — Progress Notes (Signed)
Straight cath completed, 576ml of urine output. Pt is due to void at 22:30pm, if unable to void, insert a foley (per  Dr. Karleen Hampshire).

## 2017-03-09 NOTE — Progress Notes (Signed)
ANTICOAGULATION CONSULT NOTE - Follow Up Consult  Pharmacy Consult for Heparin  Indication: Afib/PVD  No Known Allergies  Patient Measurements: Height: 6' (182.9 cm) Weight: 162 lb 11.2 oz (73.8 kg) IBW/kg (Calculated) : 73.1  Vital Signs: Temp: 98 F (36.7 C) (08/06 0800) Temp Source: Oral (08/06 0800) BP: 114/61 (08/06 0433) Pulse Rate: 79 (08/06 0433)  Labs:  Recent Labs  03/07/17 0443  03/08/17 0654 03/08/17 1857 03/09/17 0254  HGB 9.7*  --  9.7*  --  9.7*  HCT 33.9*  --  34.8*  --  34.3*  PLT 1,854*  --  1,948*  --  2,065*  LABPROT 18.6*  --  17.4*  --  18.3*  INR 1.54  --  1.41  --  1.50  HEPARINUNFRC <0.10*  < > 0.24* 0.18* 0.34  CREATININE 1.73*  --  2.18*  --   --   < > = values in this interval not displayed.  Estimated Creatinine Clearance: 26.1 mL/min (A) (by C-G formula based on SCr of 2.18 mg/dL (H)).  Medications: Heparin @ 1700 units/hr  Assessment:  76yof on coumadin pta for afib, transitioned to heparin anticipating need for vascular procedure.  INR 3.93 on admit - received 2.5mg  vitamin k and several units of FFP - INR down to 1.5 today. Heparin level is therapeutic at 0.34. CBC stable. No bleeding.  Goal of Therapy:  Heparin level 0.3-0.7 units/ml Monitor platelets by anticoagulation protocol: Yes   Plan:  1) Continue heparin at 1700 units/hr 2) Daily heparin level and CBC 3) Follow up vascular surgery plan re: bypass vs BKA  Nena Jordan, PharmD, BCPS 03/09/2017,8:20 AM

## 2017-03-09 NOTE — Progress Notes (Signed)
Patient bladder scanned upon return from [place].  89 mL found in bladder.  Will continue to monitor.

## 2017-03-09 NOTE — Progress Notes (Signed)
PROGRESS NOTE    Charlotte [name]  WYO:[id number] DOB: September 23, 1940 DOA: 03/04/2017 PCP: [name], MD    Brief Narrative: 75 year old female with history of arthritis, chronic atrial fibrillation, diastolic heart failure, hypertension, hypothyroidism, peripheral neuropathy, polycythemia vera, peripheral vascular disease presented with complaints of right lower extremity pain. Patient found to have ischemic limb, vascular surgery consulted and appreciated. Patient underwent abdominal aortogram with bilateral lower extremity run off. She was found ot have right leg occlusion of the right superficial femoral popliteal posterior tibial and anterior tibial arteries and reconstitution of diminutive peroneal artery. Plan for a right common femoral to tibioperoneal trunk/peroneal bypass once she is cleared by cardiology and with adequate vein conduit. Vein mapping to be done. NST done on 8/4 , cleared by cardiology to undergo surgery.    Assessment & Plan:   Principal Problem:   Lower extremity arterial insufficiency, severe, right (HCC) Active Problems:   Polycythemia vera (HCC)   Chronic atrial fibrillation (HCC)   HTN (hypertension)   Hypothyroidism   Peripheral vascular disease (HCC)   Thrombocytosis (HCC)   Hyperlipidemia   Right lower extremity ischemia secondary to severe PAD and right leg occlusion of the right superficial femoral, popliteal, posterior and anterior tibial arteries.  Started on IV heparin , vascular surgery consulted and underwent abdominal aortogram showing the above.  Plan for right common femoral to tibioperoneal trunk/peroneal bypass , with adequate vein conduit vs AKA  as recommended by vascular . Pt is cleared from cardiology stand point to undergo the procedure.  Vein mapping done.  Pain control.     chronic atrial fibrillation:  Rate controlled, with metoprolol and cardizem.  Was on coumadin at home, transitioned to IV heparin for the surgery.  INR  subtherapeutic.   Acute kidney injury on  CKD stage 3.  Possibly from torsemide and hypoperfusion. Vs urinary retention.  Pt has minimal oral intake and hypotensive yesterday, her torsemide has been on hold. Fluid bolus ordered and increased her fluids.  Urine electrolytes, calculate FeNa. UA,  US renal to check for obstruction.  If her renal parameters does not improve with the increase in fluds, will get nephrology consult for recommendations.  She had urinary retention earlier today and had to undergo in and out cath, monitor urine output and if she continuesto have urinary retention, plan for foley catheter placement.   Hypothyroidism: resume synthroid.   Hypertension; bp running low. Hold bp med if sbp<90 mmhg.    Polycythemia Vera:  Resume hydroxyurea.  Hemoglobin stable around 9.  No signs of bleeding.  Leukocytosis improving.  Thrombocytosis is worsening. Spoke with Dr Talbert Cage over the phone, who has been seeing the patient for the PV, recommended to continue the same treatment for now.    Hyperkalemia:  Possibly from potassium supplements/ constipation.   Kayexalate, senna, colace and miralax ordered.  Calcium gluconate ordered.  ekg does not show any peaked t waves.     DVT prophylaxis: lovenox.  Code Status: full code.  Family Communication: none at bedside.  Disposition Plan: pending further evaluation.   Consultants:   Cardiology  Vascular surgery.    Procedures: NST    Antimicrobials: none.    Subjective: No new complaints. No chest pain or sob.  She was weaned downto her home oxygen at 4 lit/min.  She was teary today and little anxious.   Objective: Vitals:   03/09/17 0433 03/09/17 0800 03/09/17 1200 03/09/17 1544  BP: 114/61  94/68 101/64  Pulse: 79  Resp:   20 19  Temp: (!) 97.5 F (36.4 C) 98 F (36.7 C) 98 F (36.7 C) 98.3 F (36.8 C)  TempSrc: Oral Oral Oral Oral  SpO2:  96% 100% 99%  Weight: 73.8 kg (162 lb 11.2 oz)     Height:         Intake/Output Summary (Last 24 hours) at 03/09/17 1611 Last data filed at 03/09/17 0800  Gross per 24 hour  Intake              240 ml  Output              650 ml  Net             -410 ml   Filed Weights   03/07/17 0513 03/08/17 0434 03/09/17 0433  Weight: 71.5 kg (157 lb 11.2 oz) 68 kg (149 lb 14.4 oz) 73.8 kg (162 lb 11.2 oz)    Examination:  General exam: Appears calm and comfortable on 4 lit of [place] oxygen.  Respiratory system: Clear to auscultation. Respiratory effort normal. No wheeizng or rhonchi.  Cardiovascular system: S1 & S2 heard, RRR.  Gastrointestinal system: Abdomen is nondistended, soft and nontender. No organomegaly or masses felt. Normal bowel sounds heard. Central nervous system: Alert and oriented. No focal neurological deficits. Extremities: Symmetric 5 x 5 power. Cold extremities more on the right when compared to left. Venous stasis changes bialteral lower extremities.  Skin: No rashes, lesions or ulcers Psychiatry: . Anxious , but not agitated.    Data Reviewed: I have personally reviewed following labs and imaging studies  CBC:  Recent Labs Lab 03/03/17 1039 03/04/17 2159 03/05/17 0224 03/06/17 0329 03/07/17 0443 03/08/17 0654 03/09/17 0254  WBC 43.5* 46.6* 44.8* 49.1* 42.8* 37.5* 33.0*  NEUTROABS 39.2* 41.9* 39.0* 44.2* 39.8*  --   --   HGB 10.6* 10.0* 9.1* 9.5* 9.7* 9.7* 9.7*  HCT 37.9 35.8* 33.1* 34.1* 33.9* 34.8* 34.3*  MCV 78.1 77.0* 75.9* 75.8* 76.5* 76.3* 76.1*  PLT 1,234* 1,620* 1,392* 1,591* 1,854* 1,948* 1,962*   Basic Metabolic Panel:  Recent Labs Lab 03/05/17 0224 03/06/17 0329 03/07/17 0443 03/08/17 0654 03/09/17 1020  NA 137 134* 137 133* 132*  K 2.8* 4.0 3.8 4.6 6.3*  CL 99* 99* 103 98* 100*  CO2 29 27 26 22  20*  GLUCOSE 108* 101* 99 106* 97  BUN 35* 34* 37* 44* 50*  CREATININE 1.55* 1.60* 1.73* 2.18* 2.74*  CALCIUM 8.2* 8.5* 8.4* 8.6* 8.5*  MG 2.3  --   --   --   --    GFR: Estimated Creatinine Clearance:  20.8 mL/min (A) (by C-G formula based on SCr of 2.74 mg/dL (H)). Liver Function Tests:  Recent Labs Lab 03/03/17 1039 03/05/17 0224  AST 25 26  ALT 9* 11*  ALKPHOS 133* 126  BILITOT 1.1 1.0  PROT 6.7 5.9*  ALBUMIN 3.1* 2.8*   No results for input(s): LIPASE, AMYLASE in the last 168 hours. No results for input(s): AMMONIA in the last 168 hours. Coagulation Profile:  Recent Labs Lab 03/06/17 0329 03/06/17 1142 03/07/17 0443 03/08/17 0654 03/09/17 0254  INR 1.85 1.49 1.54 1.41 1.50   Cardiac Enzymes: No results for input(s): CKTOTAL, CKMB, CKMBINDEX, TROPONINI in the last 168 hours. BNP (last 3 results) No results for input(s): PROBNP in the last 8760 hours. HbA1C: No results for input(s): HGBA1C in the last 72 hours. CBG:  Recent Labs Lab 03/09/17 1430  GLUCAP 96   Lipid Profile: No  results for input(s): CHOL, HDL, LDLCALC, TRIG, CHOLHDL, LDLDIRECT in the last 72 hours. Thyroid Function Tests: No results for input(s): TSH, T4TOTAL, FREET4, T3FREE, THYROIDAB in the last 72 hours. Anemia Panel: No results for input(s): VITAMINB12, FOLATE, FERRITIN, TIBC, IRON, RETICCTPCT in the last 72 hours. Sepsis Labs: No results for input(s): PROCALCITON, LATICACIDVEN in the last 168 hours.  Recent Results (from the past 240 hour(s))  Surgical PCR screen     Status: None   Collection Time: 03/06/17 12:54 PM  Result Value Ref Range Status   MRSA, PCR NEGATIVE NEGATIVE Final   Staphylococcus aureus NEGATIVE NEGATIVE Final    Comment:        The Xpert SA Assay (FDA approved for NASAL specimens in patients over 41 years of age), is one component of a comprehensive surveillance program.  Test performance has been validated by [facility] for patients greater than or equal to 8 year old. It is not intended to diagnose infection nor to guide or monitor treatment.          Radiology Studies: No results found.      Scheduled Meds: . aspirin EC  81 mg Oral  Daily  . diltiazem  60 mg Oral Q6H  . gabapentin  100 mg Oral TID  . guaiFENesin  600 mg Oral BID  . hydroxyurea  1,000 mg Oral Q breakfast  . levothyroxine  75 mcg Oral QAC breakfast  . metoprolol tartrate  50 mg Oral BID  . pantoprazole  40 mg Oral Daily  . pravastatin  10 mg Oral Daily   Continuous Infusions: . sodium chloride    . sodium chloride    . heparin 1,700 Units/hr (03/08/17 2007)  . sodium chloride       LOS: 5 days    Time spent: 35 MINUTES.     Hosie Poisson, MD [facility] Pager [phone]  If 7PM-7AM, please contact night-coverage [email or web address] Password [email or web address] 03/09/2017, 4:11 PM

## 2017-03-09 NOTE — Care Management Important Message (Signed)
Important Message  Patient Details  Name: Charlotte [name] MRN: [id number] Date of Birth: [birth year]   Medicare Important Message Given:  Yes    Nathen May 03/09/2017, 10:22 AM

## 2017-03-09 NOTE — Progress Notes (Signed)
CRITICAL VALUE ALERT  Critical Value:  Potassium- 6.3. Dr. Karleen Hampshire paged.

## 2017-03-10 DIAGNOSIS — D473 Essential (hemorrhagic) thrombocythemia: Secondary | ICD-10-CM

## 2017-03-10 LAB — HEPARIN LEVEL (UNFRACTIONATED)
HEPARIN UNFRACTIONATED: 0.37 [IU]/mL (ref 0.30–0.70)
Heparin Unfractionated: 0.23 IU/mL — ABNORMAL LOW (ref 0.30–0.70)

## 2017-03-10 LAB — BASIC METABOLIC PANEL
ANION GAP: 11 (ref 5–15)
BUN: 46 mg/dL — ABNORMAL HIGH (ref 6–20)
CALCIUM: 8.2 mg/dL — AB (ref 8.9–10.3)
CHLORIDE: 103 mmol/L (ref 101–111)
CO2: 20 mmol/L — AB (ref 22–32)
Creatinine, Ser: 2.3 mg/dL — ABNORMAL HIGH (ref 0.44–1.00)
GFR calc non Af Amer: 20 mL/min — ABNORMAL LOW (ref >60)
GFR, EST AFRICAN AMERICAN: 23 mL/min — AB (ref >60)
Glucose, Bld: 90 mg/dL (ref 65–99)
POTASSIUM: 4 mmol/L (ref 3.5–5.1)
Sodium: 134 mmol/L — ABNORMAL LOW (ref 135–145)

## 2017-03-10 LAB — CBC
HEMATOCRIT: 32.1 % — AB (ref 36.0–46.0)
HEMOGLOBIN: 9.1 g/dL — AB (ref 12.0–15.0)
MCH: 21.3 pg — AB (ref 26.0–34.0)
MCHC: 28.3 g/dL — ABNORMAL LOW (ref 30.0–36.0)
MCV: 75.2 fL — AB (ref 78.0–100.0)
Platelets: 1898 10*3/uL (ref 150–400)
RBC: 4.27 MIL/uL (ref 3.87–5.11)
RDW: 25.4 % — ABNORMAL HIGH (ref 11.5–15.5)
WBC: 25.6 10*3/uL — ABNORMAL HIGH (ref 4.0–10.5)

## 2017-03-10 LAB — PROTIME-INR
INR: 1.67
Prothrombin Time: 19.9 seconds — ABNORMAL HIGH (ref 11.4–15.2)

## 2017-03-10 LAB — POTASSIUM: Potassium: 4.4 mmol/L (ref 3.5–5.1)

## 2017-03-10 MED ORDER — DIPHENHYDRAMINE HCL 25 MG PO CAPS: 25.0000 mg | ORAL_CAPSULE | Freq: Every evening | ORAL | Status: DC | PRN

## 2017-03-10 MED ORDER — OXYCODONE HCL 5 MG PO TABS
10.0000 mg | ORAL_TABLET | Freq: Four times a day (QID) | ORAL | Status: DC | PRN
  Administered 2017-03-10 – 2017-03-11 (×2): 10 mg via ORAL
  Filled 2017-03-10 (×2): qty 2

## 2017-03-10 MED ORDER — HYDROMORPHONE HCL 1 MG/ML IJ SOLN
1.0000 mg | INTRAMUSCULAR | Status: DC | PRN
  Administered 2017-03-10: 1 mg via INTRAVENOUS
  Administered 2017-03-10: 2 mg via INTRAVENOUS
  Administered 2017-03-11: 1 mg via INTRAVENOUS
  Filled 2017-03-10: qty 2
  Filled 2017-03-10 (×2): qty 1

## 2017-03-10 NOTE — Progress Notes (Signed)
ANTICOAGULATION CONSULT NOTE - Follow Up Consult  Pharmacy Consult for Heparin  Indication: Afib/PVD  No Known Allergies  Patient Measurements: Height: 6' (182.9 cm) Weight: 162 lb 11.2 oz (73.8 kg) IBW/kg (Calculated) : 73.1  Vital Signs: Temp: 98 F (36.7 C) (08/07 0007) Temp Source: Oral (08/07 0007) BP: 115/65 (08/07 0007) Pulse Rate: 110 (08/07 0007)  Labs:  Recent Labs  03/08/17 0654 03/08/17 1857 03/09/17 0254 03/09/17 1020 03/09/17 1755 03/10/17 0336  HGB 9.7*  --  9.7*  --   --   --   HCT 34.8*  --  34.3*  --   --   --   PLT 1,948*  --  2,065*  --   --   --   LABPROT 17.4*  --  18.3*  --   --  19.9*  INR 1.41  --  1.50  --   --  1.67  HEPARINUNFRC 0.24* 0.18* 0.34  --   --  0.23*  CREATININE 2.18*  --   --  2.74* 2.51* 2.30*    Estimated Creatinine Clearance: 24.8 mL/min (A) (by C-G formula based on SCr of 2.3 mg/dL (H)).  Assessment:  76yof on coumadin pta for afib, transitioned to heparin anticipating need for vascular procedure. INR 3.93 on admit - received 2.5mg  vitamin k and several units of FFP - INR down to 1.67. Heparin level is subtherapeutic at 0.23. RN reports one interruption for approximately 15 minutes overnight. CBC low, but stable and no bleeding noted.  Goal of Therapy:  Heparin level 0.3-0.7 units/ml Monitor platelets by anticoagulation protocol: Yes   Plan:  Increase heparin to 1850 units/hr  Heparin level in 8 hours  Daily heparin level and CBC Follow up vascular surgery plan re: bypass vs BKA  Argie Ramming, PharmD Clinical Pharmacist  03/10/17 5:17 AM

## 2017-03-10 NOTE — Care Management Note (Signed)
Case Management Note Charlotte Gibbons RN, BSN Unit 4E-Case Manager [phone]  Patient Details  Name: Charlotte [name] MRN: [id number] Date of Birth: [birth year]  Subjective/Objective:     Pt admitted with LE arterial insufficiency- plan for OR on Thur 8/9 for  Fem-tibioperoneal bypass for attempted limb salvage- remains on IV heparin              Action/Plan: PTA pt lived at home with spouse- will need PT/OT evals when able to participate to assist in d/c planning- pt may be eligible for Home First program with [facility]- CM will continue to follow post op for d/c needs and plan.   Expected Discharge Date:                 Expected Discharge Plan:  [place]  In-House Referral:  Clinical Social Work  Discharge planning Services  CM Consult  Post Acute Care Choice:    Choice offered to:     DME Arranged:    DME Agency:     HH Arranged:    [place] Agency:     Status of Service:  In process, will continue to follow  If discussed at Long Length of Stay Meetings, dates discussed:  8/7  Discharge Disposition:   Additional Comments:  Dawayne Patricia, RN 03/10/2017, 10:38 AM

## 2017-03-10 NOTE — Progress Notes (Signed)
Subjective: Interval History: none.. Continued with severe rest pain last night  Objective: Vital signs in last 24 hours: Temp:  [97.5 F (36.4 C)-98.4 F (36.9 C)] 97.5 F (36.4 C) (08/07 0801) Pulse Rate:  [88-118] 88 (08/07 0930) Resp:  [17-24] 17 (08/07 0801) BP: (94-122)/(64-83) 121/83 (08/07 0801) SpO2:  [98 %-100 %] 100 % (08/07 0801) Weight:  [169 lb 8.5 oz (76.9 kg)] 169 lb 8.5 oz (76.9 kg) (08/07 0630)  Intake/Output from previous day: 08/06 0701 - 08/07 0700 In: 2342 [P.O.:480; I.V.:1862] Out: 1575 [Urine:1575] Intake/Output this shift: No intake/output data recorded.  No change in physical exam. Right foot cool compared to left. Motor and sensory intact. No tissue loss  Lab Results:  Recent Labs  03/09/17 0254 03/10/17 0336  WBC 33.0* 25.6*  HGB 9.7* 9.1*  HCT 34.3* 32.1*  PLT 2,065* 1,898*   BMET  Recent Labs  03/09/17 1755  03/10/17 0336 03/10/17 0808  NA 130*  --  134*  --   K 4.3  < > 4.0 4.4  CL 101  --  103  --   CO2 20*  --  20*  --   GLUCOSE 91  --  90  --   BUN 48*  --  46*  --   CREATININE 2.51*  --  2.30*  --   CALCIUM 8.2*  --  8.2*  --   < > = values in this interval not displayed.  Studies/Results: Dg Chest 2 View  Result Date: 02/12/2017 CLINICAL DATA:  Patient with productive cough for multiple days. EXAM: CHEST  2 VIEW COMPARISON:  Chest radiograph 07/03/2016. FINDINGS: Stable cardiomegaly. Small to moderate bilateral pleural effusions with underlying pulmonary consolidation. No pneumothorax. Thoracic spine degenerative changes. IMPRESSION: Small to moderate bilateral pleural effusions with underlying consolidation which may represent atelectasis or infection. Electronically Signed   By: Lovey Newcomer M.D.   On: 02/12/2017 14:17   US Renal  Result Date: 03/09/2017 CLINICAL DATA:  Acute onset of renal failure.  Initial encounter. EXAM: RENAL / URINARY TRACT ULTRASOUND COMPLETE COMPARISON:  CT of the abdomen and pelvis performed  07/18/2009 FINDINGS: Right Kidney: Length: 9.6 cm. Mildly increased renal parenchymal echogenicity is suggested. No mass or hydronephrosis visualized. Left Kidney: Length: 10.4 cm. Mildly increased renal parenchymal echogenicity is suggested. Left-sided pelvicaliectasis remains within normal limits. No mass or hydronephrosis visualized. Bladder: Decompressed and not well assessed. IMPRESSION: 1. No evidence of hydronephrosis. 2. Mildly increased renal parenchymal echogenicity may reflect medical renal disease. Electronically Signed   By: Garald Balding M.D.   On: 03/09/2017 21:21   Nm Myocar Multi W/spect W/wall Motion / Ef  Result Date: 03/07/2017 CLINICAL DATA:  Atrial fibrillation. Hypertension and hyperlipidemia. EXAM: MYOCARDIAL IMAGING WITH SPECT (REST AND PHARMACOLOGIC-STRESS) GATED LEFT VENTRICULAR WALL MOTION STUDY LEFT VENTRICULAR EJECTION FRACTION TECHNIQUE: Standard myocardial SPECT imaging was performed after resting intravenous injection of 10 mCi Tc-13m tetrofosmin. Subsequently, intravenous infusion of Lexiscan was performed under the supervision of the Cardiology staff. At peak effect of the drug, 30 mCi Tc-55m tetrofosmin was injected intravenously and standard myocardial SPECT imaging was performed. Quantitative gated imaging was also performed to evaluate left ventricular wall motion, and estimate left ventricular ejection fraction. COMPARISON:  None. FINDINGS: Perfusion: No decreased activity in the left ventricle on stress imaging to suggest reversible ischemia or infarction. Wall Motion: Normal left ventricular wall motion. No left ventricular dilation. Left Ventricular Ejection Fraction: 69 % End diastolic volume 88 ml End systolic volume 27 ml IMPRESSION:  1. No reversible ischemia or infarction. 2. Normal left ventricular wall motion. 3. Left ventricular ejection fraction 69% 4. Non invasive risk stratification*: Low *2012 Appropriate Use Criteria for Coronary Revascularization Focused  Update: J Am Coll Cardiol. 9444;61(9):012-224. http://content.airportbarriers.com.aspx?articleid=[email or web address] Electronically Signed   By: Earle Gell M.D.   On: 03/07/2017 14:34   Anti-infectives: Anti-infectives    None      Assessment/Plan: s/p Procedure(s): ABDOMINAL AORTOGRAM (N/A) Lower Extremity Angiography (Bilateral) Discussed with the patient and her husband. Recommend right femoral to tibioperoneal trunk with vein for attempted limb salvage. Not able to get on the operative schedule until Thursday. Patient understands. Continue heparin until that time   LOS: 6 days   Charlotte [name] 03/10/2017, 10:02 AM

## 2017-03-10 NOTE — Progress Notes (Signed)
ANTICOAGULATION CONSULT NOTE - Follow Up Consult  Pharmacy Consult for Heparin  Indication: Afib/PVD  No Known Allergies  Patient Measurements: Height: 6' (182.9 cm) Weight: 169 lb 8.5 oz (76.9 kg) IBW/kg (Calculated) : 73.1  Vital Signs: Temp: 98.9 F (37.2 C) (08/07 1543) Temp Source: Oral (08/07 1543) BP: 123/71 (08/07 1543) Pulse Rate: 86 (08/07 1113)  Labs:  Recent Labs  03/08/17 0654  03/09/17 0254 03/09/17 1020 03/09/17 1755 03/10/17 0336 03/10/17 1518  HGB 9.7*  --  9.7*  --   --  9.1*  --   HCT 34.8*  --  34.3*  --   --  32.1*  --   PLT 1,948*  --  2,065*  --   --  1,898*  --   LABPROT 17.4*  --  18.3*  --   --  19.9*  --   INR 1.41  --  1.50  --   --  1.67  --   HEPARINUNFRC 0.24*  < > 0.34  --   --  0.23* 0.37  CREATININE 2.18*  --   --  2.74* 2.51* 2.30*  --   < > = values in this interval not displayed.  Estimated Creatinine Clearance: 24.8 mL/min (A) (by C-G formula based on SCr of 2.3 mg/dL (H)).  Assessment:  76yof on coumadin pta for afib, transitioned to heparin anticipating need for vascular procedure. Heparin level 0.37, therapeutic after rate increased to 1850 units/hr this morning.   Goal of Therapy:  Heparin level 0.3-0.7 units/ml Monitor platelets by anticoagulation protocol: Yes   Plan:  Continue heparin 1850 units/hr  Daily heparin level and CBC  Maryanna Shape, PharmD, BCPS  Clinical Pharmacist  Pager: [phone]   03/10/17 4:23 PM

## 2017-03-10 NOTE — Progress Notes (Signed)
PROGRESS NOTE    Charlotte [name]  GNF:[id number] DOB: May 10, 1941 DOA: 03/04/2017 PCP: [name], MD    Brief Narrative: 76 year old female with history of arthritis, chronic atrial fibrillation, diastolic heart failure, hypertension, hypothyroidism, peripheral neuropathy, polycythemia vera, peripheral vascular disease presented with complaints of right lower extremity pain. Patient found to have ischemic limb, vascular surgery consulted and appreciated. Patient underwent abdominal aortogram with bilateral lower extremity run off. She was found ot have right leg occlusion of the right superficial femoral popliteal posterior tibial and anterior tibial arteries and reconstitution of diminutive peroneal artery. Plan for a right common femoral to tibioperoneal trunk/peroneal bypass once she is cleared by cardiology and with adequate vein conduit. Vein mapping to be done. NST done on 8/4 , cleared by cardiology to undergo surgery.    Assessment & Plan:   Principal Problem:   Lower extremity arterial insufficiency, severe, right (HCC) Active Problems:   Polycythemia vera (HCC)   Chronic atrial fibrillation (HCC)   HTN (hypertension)   Hypothyroidism   Peripheral vascular disease (HCC)   Thrombocytosis (HCC)   Hyperlipidemia   Right lower extremity ischemia secondary to severe PAD and right leg occlusion of the right superficial femoral, popliteal, posterior and anterior tibial arteries.  Started on IV heparin , vascular surgery consulted and underwent abdominal aortogram showing the above.  Plan for right common femoral to tibioperoneal trunk/peroneal bypass , with adequate vein conduit as recommended by vascular on thursday . Pt is cleared from cardiology stand point to undergo the procedure.  Vein mapping done.  Pain control.     chronic atrial fibrillation:  Rate controlled, with metoprolol and cardizem.  Was on coumadin at home, transitioned to IV heparin for the surgery.  INR  subtherapeutic.   Acute kidney injury on  CKD stage 3.  Possibly from torsemide and hypoperfusion. Vs urinary retention.  Pt has minimal oral intake and hypotensive last 48 hours, her torsemide has been on hold.  UA negative for infection.  US renal does not show any hydronephrosis, has medical renal disease.  She had urinary retention earlier today and had to undergo in and out cath, monitor urine output and if she continuesto have urinary retention, plan for foley catheter placement.  Renal parameters improved with IV fluids, but not back to baseline.    Hypothyroidism: resume synthroid.   Hypertension; better bp today, on cardizem and metoprolol.    Polycythemia Vera:  Resume hydroxyurea.  Hemoglobin stable around 9.  No signs of bleeding.  Leukocytosis improving.  Thrombocytosis is better than yesterday. Spoke with Dr Talbert Cage over the phone, who has been seeing the patient for the PV, recommended to continue the same treatment for now. No changes.    Hyperkalemia:  Possibly from potassium supplements/ constipation.   Kayexalate, senna, colace and miralax ordered.  Calcium gluconate ordered.  ekg does not show any peaked t waves.  She had two bowel movements and her subsequent potassium levels have normalized.     DVT prophylaxis: heparin gtt.  Code Status: full code.  Family Communication: none at bedside.  Disposition Plan: pending surgery on Thursday.   Consultants:   Cardiology  Vascular surgery.    Procedures: NST    Antimicrobials: none.    Subjective: Right lower extremity pain,   Objective: Vitals:   03/10/17 0630 03/10/17 0801 03/10/17 0930 03/10/17 1113  BP:  121/83  106/64  Pulse:   88 86  Resp:  17    Temp:  (!) 97.5 F (36.4  C)    TempSrc:  Oral    SpO2:  100%  95%  Weight: 76.9 kg (169 lb 8.5 oz)     Height:        Intake/Output Summary (Last 24 hours) at 03/10/17 1123 Last data filed at 03/10/17 1000  Gross per 24 hour  Intake           2581.96 ml  Output             1175 ml  Net          1406.96 ml   Filed Weights   03/08/17 0434 03/09/17 0433 03/10/17 0630  Weight: 68 kg (149 lb 14.4 oz) 73.8 kg (162 lb 11.2 oz) 76.9 kg (169 lb 8.5 oz)    Examination:  General exam: Appears calm and comfortable on 4 lit of  oxygen.  Respiratory system: no wheezing or rhonchi, clear to auscultation.  Cardiovascular system: S1 & S2 heard, RRR.  Gastrointestinal system: Abdomen is soft NT nd bs+ Central nervous system: Alert and oriented. No focal neurological deficits. Extremities: Symmetric 5 x 5 power. Cold extremities more on the right when compared to left. Venous stasis changes bialteral lower extremities. No pedal edema.  Skin: No rashes, lesions or ulcers Psychiatry: . Mood and affect appropriate.    Data Reviewed: I have personally reviewed following labs and imaging studies  CBC:  Recent Labs Lab 03/04/17 2159 03/05/17 0224 03/06/17 0329 03/07/17 0443 03/08/17 0654 03/09/17 0254 03/10/17 0336  WBC 46.6* 44.8* 49.1* 42.8* 37.5* 33.0* 25.6*  NEUTROABS 41.9* 39.0* 44.2* 39.8*  --   --   --   HGB 10.0* 9.1* 9.5* 9.7* 9.7* 9.7* 9.1*  HCT 35.8* 33.1* 34.1* 33.9* 34.8* 34.3* 32.1*  MCV 77.0* 75.9* 75.8* 76.5* 76.3* 76.1* 75.2*  PLT 1,620* 1,392* 1,591* 1,854* 1,948* 2,065* 5,573*   Basic Metabolic Panel:  Recent Labs Lab 03/05/17 0224  03/07/17 0443 03/08/17 0654 03/09/17 1020 03/09/17 1542 03/09/17 1755 03/09/17 2136 03/09/17 2334 03/10/17 0336 03/10/17 0808  NA 137  < > 137 133* 132*  --  130*  --   --  134*  --   K 2.8*  < > 3.8 4.6 6.3* 6.3* 4.3 4.1 3.9 4.0 4.4  CL 99*  < > 103 98* 100*  --  101  --   --  103  --   CO2 29  < > 26 22 20*  --  20*  --   --  20*  --   GLUCOSE 108*  < > 99 106* 97 69 91  --   --  90  --   BUN 35*  < > 37* 44* 50*  --  48*  --   --  46*  --   CREATININE 1.55*  < > 1.73* 2.18* 2.74*  --  2.51*  --   --  2.30*  --   CALCIUM 8.2*  < > 8.4* 8.6* 8.5*  --  8.2*  --    --  8.2*  --   MG 2.3  --   --   --   --   --   --   --   --   --   --   < > = values in this interval not displayed. GFR: Estimated Creatinine Clearance: 24.8 mL/min (A) (by C-G formula based on SCr of 2.3 mg/dL (H)). Liver Function Tests:  Recent Labs Lab 03/05/17 0224  AST 26  ALT 11*  ALKPHOS 126  BILITOT  1.0  PROT 5.9*  ALBUMIN 2.8*   No results for input(s): LIPASE, AMYLASE in the last 168 hours. No results for input(s): AMMONIA in the last 168 hours. Coagulation Profile:  Recent Labs Lab 03/06/17 1142 03/07/17 0443 03/08/17 0654 03/09/17 0254 03/10/17 0336  INR 1.49 1.54 1.41 1.50 1.67   Cardiac Enzymes: No results for input(s): CKTOTAL, CKMB, CKMBINDEX, TROPONINI in the last 168 hours. BNP (last 3 results) No results for input(s): PROBNP in the last 8760 hours. HbA1C: No results for input(s): HGBA1C in the last 72 hours. CBG:  Recent Labs Lab 03/09/17 1430  GLUCAP 96   Lipid Profile: No results for input(s): CHOL, HDL, LDLCALC, TRIG, CHOLHDL, LDLDIRECT in the last 72 hours. Thyroid Function Tests: No results for input(s): TSH, T4TOTAL, FREET4, T3FREE, THYROIDAB in the last 72 hours. Anemia Panel: No results for input(s): VITAMINB12, FOLATE, FERRITIN, TIBC, IRON, RETICCTPCT in the last 72 hours. Sepsis Labs: No results for input(s): PROCALCITON, LATICACIDVEN in the last 168 hours.  Recent Results (from the past 240 hour(s))  Surgical PCR screen     Status: None   Collection Time: 03/06/17 12:54 PM  Result Value Ref Range Status   MRSA, PCR NEGATIVE NEGATIVE Final   Staphylococcus aureus NEGATIVE NEGATIVE Final    Comment:        The Xpert SA Assay (FDA approved for NASAL specimens in patients over 29 years of age), is one component of a comprehensive surveillance program.  Test performance has been validated by [facility] for patients greater than or equal to 64 year old. It is not intended to diagnose infection nor to guide or monitor  treatment.          Radiology Studies: US Renal  Result Date: 03/09/2017 CLINICAL DATA:  Acute onset of renal failure.  Initial encounter. EXAM: RENAL / URINARY TRACT ULTRASOUND COMPLETE COMPARISON:  CT of the abdomen and pelvis performed 07/18/2009 FINDINGS: Right Kidney: Length: 9.6 cm. Mildly increased renal parenchymal echogenicity is suggested. No mass or hydronephrosis visualized. Left Kidney: Length: 10.4 cm. Mildly increased renal parenchymal echogenicity is suggested. Left-sided pelvicaliectasis remains within normal limits. No mass or hydronephrosis visualized. Bladder: Decompressed and not well assessed. IMPRESSION: 1. No evidence of hydronephrosis. 2. Mildly increased renal parenchymal echogenicity may reflect medical renal disease. Electronically Signed   By: Garald Balding M.D.   On: 03/09/2017 21:21        Scheduled Meds: . aspirin EC  81 mg Oral Daily  . diltiazem  60 mg Oral Q6H  . gabapentin  100 mg Oral TID  . guaiFENesin  600 mg Oral BID  . hydroxyurea  1,000 mg Oral Q breakfast  . levothyroxine  75 mcg Oral QAC breakfast  . metoprolol tartrate  50 mg Oral BID  . pantoprazole  40 mg Oral Daily  . polyethylene glycol  17 g Oral Daily  . pravastatin  10 mg Oral Daily  . senna-docusate  1 tablet Oral BID   Continuous Infusions: . sodium chloride    . sodium chloride 125 mL/hr at 03/10/17 1108  . heparin 1,850 Units/hr (03/10/17 0539)     LOS: 6 days    Time spent: 35 MINUTES.     Hosie Poisson, MD [facility] Pager [phone]  If 7PM-7AM, please contact night-coverage [email or web address] Password [email or web address] 03/10/2017, 11:23 AM

## 2017-03-11 ENCOUNTER — Inpatient Hospital Stay (HOSPITAL_COMMUNITY): Payer: Medicare Other | Admitting: Certified Registered Nurse Anesthetist

## 2017-03-11 ENCOUNTER — Encounter (HOSPITAL_COMMUNITY)
Admission: EM | Disposition: A | Discharge status: Nursing Home (Includes ICF) | Payer: Self-pay | Source: Home / Self Care | Attending: Pulmonary Disease

## 2017-03-11 ENCOUNTER — Encounter (HOSPITAL_COMMUNITY): Payer: Self-pay | Admitting: Certified Registered Nurse Anesthetist

## 2017-03-11 ENCOUNTER — Inpatient Hospital Stay (HOSPITAL_COMMUNITY): Payer: Medicare Other

## 2017-03-11 DIAGNOSIS — N189 Chronic kidney disease, unspecified: Secondary | ICD-10-CM

## 2017-03-11 DIAGNOSIS — I70221 Atherosclerosis of native arteries of extremities with rest pain, right leg: Secondary | ICD-10-CM

## 2017-03-11 HISTORY — PX: FEMORAL-TIBIAL BYPASS GRAFT: SHX938

## 2017-03-11 LAB — BASIC METABOLIC PANEL
Anion gap: 12 (ref 5–15)
BUN: 44 mg/dL — ABNORMAL HIGH (ref 6–20)
CO2: 18 mmol/L — ABNORMAL LOW (ref 22–32)
Calcium: 8.2 mg/dL — ABNORMAL LOW (ref 8.9–10.3)
Chloride: 105 mmol/L (ref 101–111)
Creatinine, Ser: 1.99 mg/dL — ABNORMAL HIGH (ref 0.44–1.00)
GFR calc Af Amer: 27 mL/min — ABNORMAL LOW (ref >60)
GFR calc non Af Amer: 24 mL/min — ABNORMAL LOW (ref >60)
Glucose, Bld: 85 mg/dL (ref 65–99)
Potassium: 3.7 mmol/L (ref 3.5–5.1)
Sodium: 135 mmol/L (ref 135–145)

## 2017-03-11 LAB — BLOOD GAS, ARTERIAL
ACID-BASE DEFICIT: 5.3 mmol/L — AB (ref 0.0–2.0)
BICARBONATE: 19.5 mmol/L — AB (ref 20.0–28.0)
Drawn by: 51155
FIO2: 100
MECHVT: 580 mL
O2 SAT: 96.9 %
PATIENT TEMPERATURE: 98.6
PEEP/CPAP: 5 cmH2O
RATE: 12 resp/min
pCO2 arterial: 37.4 mmHg (ref 32.0–48.0)
pH, Arterial: 7.336 — ABNORMAL LOW (ref 7.350–7.450)
pO2, Arterial: 93.9 mmHg (ref 83.0–108.0)

## 2017-03-11 LAB — CBC
HCT: 31.4 % — ABNORMAL LOW (ref 36.0–46.0)
Hemoglobin: 8.6 g/dL — ABNORMAL LOW (ref 12.0–15.0)
MCH: 20.8 pg — ABNORMAL LOW (ref 26.0–34.0)
MCHC: 27.4 g/dL — ABNORMAL LOW (ref 30.0–36.0)
MCV: 75.8 fL — ABNORMAL LOW (ref 78.0–100.0)
Platelets: 1690 10*3/uL (ref 150–400)
RBC: 4.14 MIL/uL (ref 3.87–5.11)
RDW: 25.2 % — ABNORMAL HIGH (ref 11.5–15.5)
WBC: 26.1 10*3/uL — ABNORMAL HIGH (ref 4.0–10.5)

## 2017-03-11 LAB — PROCALCITONIN: Procalcitonin: 0.5 ng/mL

## 2017-03-11 LAB — LACTIC ACID, PLASMA: LACTIC ACID, VENOUS: 0.9 mmol/L (ref 0.5–1.9)

## 2017-03-11 LAB — PREPARE RBC (CROSSMATCH)

## 2017-03-11 LAB — PROTIME-INR
INR: 2.1
Prothrombin Time: 23.9 seconds — ABNORMAL HIGH (ref 11.4–15.2)

## 2017-03-11 LAB — HEPARIN LEVEL (UNFRACTIONATED): Heparin Unfractionated: 0.14 IU/mL — ABNORMAL LOW (ref 0.30–0.70)

## 2017-03-11 LAB — BRAIN NATRIURETIC PEPTIDE: B Natriuretic Peptide: 1374.8 pg/mL — ABNORMAL HIGH (ref 0.0–100.0)

## 2017-03-11 SURGERY — CREATION, BYPASS, ARTERIAL, FEMORAL TO TIBIAL, USING GRAFT
Anesthesia: General | Site: Leg Lower | Laterality: Right

## 2017-03-11 MED ORDER — ROCURONIUM BROMIDE 10 MG/ML (PF) SYRINGE
PREFILLED_SYRINGE | INTRAVENOUS | Status: AC
  Filled 2017-03-11: qty 5

## 2017-03-11 MED ORDER — METOPROLOL TARTRATE 50 MG PO TABS: 50.0000 mg | ORAL_TABLET | Freq: Two times a day (BID) | ORAL | Status: DC

## 2017-03-11 MED ORDER — FENTANYL CITRATE (PF) 100 MCG/2ML IJ SOLN: 50.0000 ug | Freq: Once | INTRAMUSCULAR | Status: DC

## 2017-03-11 MED ORDER — MIDAZOLAM HCL 2 MG/2ML IJ SOLN
INTRAMUSCULAR | Status: AC
  Filled 2017-03-11: qty 2

## 2017-03-11 MED ORDER — LACTATED RINGERS IV SOLN
INTRAVENOUS | Status: DC
  Administered 2017-03-11 (×2): via INTRAVENOUS

## 2017-03-11 MED ORDER — PHENYLEPHRINE HCL 10 MG/ML IJ SOLN
INTRAVENOUS | Status: DC | PRN
  Administered 2017-03-11: 85 ug/min via INTRAVENOUS

## 2017-03-11 MED ORDER — DEXTROSE 5 % IV SOLN
1.5000 g | Freq: Two times a day (BID) | INTRAVENOUS | Status: AC
  Administered 2017-03-11 – 2017-03-12 (×2): 1.5 g via INTRAVENOUS
  Filled 2017-03-11 (×2): qty 1.5

## 2017-03-11 MED ORDER — POTASSIUM CHLORIDE CRYS ER 20 MEQ PO TBCR: 20.0000 meq | EXTENDED_RELEASE_TABLET | Freq: Every day | ORAL | Status: DC | PRN

## 2017-03-11 MED ORDER — FENTANYL 2500MCG IN NS 250ML (10MCG/ML) PREMIX INFUSION
25.0000 ug/h | INTRAVENOUS | Status: DC
  Administered 2017-03-11: 50 ug/h via INTRAVENOUS
  Administered 2017-03-12: 100 ug/h via INTRAVENOUS
  Administered 2017-03-12: 200 ug/h via INTRAVENOUS
  Administered 2017-03-13 – 2017-03-14 (×2): 100 ug/h via INTRAVENOUS
  Administered 2017-03-16 (×2): 150 ug/h via INTRAVENOUS
  Administered 2017-03-17 – 2017-03-18 (×2): 125 ug/h via INTRAVENOUS
  Filled 2017-03-11 (×9): qty 250

## 2017-03-11 MED ORDER — PHENYLEPHRINE 40 MCG/ML (10ML) SYRINGE FOR IV PUSH (FOR BLOOD PRESSURE SUPPORT)
PREFILLED_SYRINGE | INTRAVENOUS | Status: AC
  Filled 2017-03-11: qty 10

## 2017-03-11 MED ORDER — ORAL CARE MOUTH RINSE
15.0000 mL | Freq: Four times a day (QID) | OROMUCOSAL | Status: DC
  Administered 2017-03-11 – 2017-03-12 (×3): 15 mL via OROMUCOSAL

## 2017-03-11 MED ORDER — EPHEDRINE 5 MG/ML INJ
INTRAVENOUS | Status: AC
  Filled 2017-03-11: qty 30

## 2017-03-11 MED ORDER — LEVOTHYROXINE SODIUM 75 MCG PO TABS
75.0000 ug | ORAL_TABLET | Freq: Every day | ORAL | Status: DC
  Administered 2017-03-13 – 2017-03-20 (×8): 75 ug via NASOGASTRIC
  Filled 2017-03-11 (×9): qty 1

## 2017-03-11 MED ORDER — DEXTROSE 5 % IV SOLN
1.5000 g | INTRAVENOUS | Status: AC
  Administered 2017-03-11: 1.5 g via INTRAVENOUS
  Filled 2017-03-11: qty 1.5

## 2017-03-11 MED ORDER — HEPARIN SODIUM (PORCINE) 5000 UNIT/ML IJ SOLN
INTRAMUSCULAR | Status: DC | PRN
  Administered 2017-03-11: 500 mL

## 2017-03-11 MED ORDER — HEPARIN (PORCINE) IN NACL 100-0.45 UNIT/ML-% IJ SOLN: 500.0000 [IU]/h | INTRAMUSCULAR | Status: DC

## 2017-03-11 MED ORDER — PAPAVERINE HCL 30 MG/ML IJ SOLN
INTRAMUSCULAR | Status: AC
  Filled 2017-03-11: qty 2

## 2017-03-11 MED ORDER — ALUM & MAG HYDROXIDE-SIMETH 200-200-20 MG/5ML PO SUSP: 15.0000 mL | ORAL | Status: DC | PRN

## 2017-03-11 MED ORDER — PROPOFOL 10 MG/ML IV BOLUS
INTRAVENOUS | Status: AC
  Filled 2017-03-11: qty 20

## 2017-03-11 MED ORDER — 0.9 % SODIUM CHLORIDE (POUR BTL) OPTIME
TOPICAL | Status: DC | PRN
  Administered 2017-03-11: 2000 mL

## 2017-03-11 MED ORDER — FENTANYL CITRATE (PF) 250 MCG/5ML IJ SOLN
INTRAMUSCULAR | Status: AC
  Filled 2017-03-11: qty 5

## 2017-03-11 MED ORDER — PANTOPRAZOLE SODIUM 40 MG IV SOLR
40.0000 mg | INTRAVENOUS | Status: DC
  Administered 2017-03-12 – 2017-03-18 (×7): 40 mg via INTRAVENOUS
  Filled 2017-03-11 (×7): qty 40

## 2017-03-11 MED ORDER — FENTANYL BOLUS VIA INFUSION
25.0000 ug | INTRAVENOUS | Status: DC | PRN
  Filled 2017-03-11: qty 25

## 2017-03-11 MED ORDER — LACTATED RINGERS IV SOLN
INTRAVENOUS | Status: DC | PRN
  Administered 2017-03-11: 14:00:00 via INTRAVENOUS

## 2017-03-11 MED ORDER — ROCURONIUM BROMIDE 100 MG/10ML IV SOLN
INTRAVENOUS | Status: DC | PRN
  Administered 2017-03-11: 50 mg via INTRAVENOUS

## 2017-03-11 MED ORDER — SODIUM BICARBONATE 4.2 % IV SOLN
INTRAVENOUS | Status: DC | PRN
  Administered 2017-03-11: 50 meq via INTRAVENOUS

## 2017-03-11 MED ORDER — FENTANYL CITRATE (PF) 100 MCG/2ML IJ SOLN
INTRAMUSCULAR | Status: DC | PRN
  Administered 2017-03-11: 50 ug via INTRAVENOUS
  Administered 2017-03-11: 100 ug via INTRAVENOUS
  Administered 2017-03-11 (×2): 50 ug via INTRAVENOUS

## 2017-03-11 MED ORDER — MAGNESIUM SULFATE 2 GM/50ML IV SOLN: 2.0000 g | Freq: Every day | INTRAVENOUS | Status: DC | PRN

## 2017-03-11 MED ORDER — SODIUM CHLORIDE 0.9 % IV SOLN: INTRAVENOUS | Status: DC

## 2017-03-11 MED ORDER — CHLORHEXIDINE GLUCONATE 0.12% ORAL RINSE (MEDLINE KIT)
15.0000 mL | Freq: Two times a day (BID) | OROMUCOSAL | Status: DC
  Administered 2017-03-11 – 2017-03-12 (×2): 15 mL via OROMUCOSAL

## 2017-03-11 MED ORDER — SODIUM CHLORIDE 0.9 % IV SOLN: 500.0000 mL | Freq: Once | INTRAVENOUS | Status: DC | PRN

## 2017-03-11 MED ORDER — ALBUMIN HUMAN 5 % IV SOLN
INTRAVENOUS | Status: DC | PRN
  Administered 2017-03-11: 15:00:00 via INTRAVENOUS

## 2017-03-11 MED ORDER — DOCUSATE SODIUM 100 MG PO CAPS: 100.0000 mg | ORAL_CAPSULE | Freq: Every day | ORAL | Status: DC

## 2017-03-11 MED ORDER — PANTOPRAZOLE SODIUM 40 MG IV SOLR: 40.0000 mg | INTRAVENOUS | Status: DC

## 2017-03-11 MED ORDER — DEXMEDETOMIDINE HCL IN NACL 400 MCG/100ML IV SOLN
INTRAVENOUS | Status: DC | PRN
  Administered 2017-03-11: 0.7 ug/kg/h via INTRAVENOUS

## 2017-03-11 SURGICAL SUPPLY — 72 items
APPLIER CLIP ROT 10 11.4 M/L (STAPLE) ×2
BANDAGE ESMARK 6X9 LF (GAUZE/BANDAGES/DRESSINGS) ×1 IMPLANT
BNDG ESMARK 6X9 LF (GAUZE/BANDAGES/DRESSINGS) ×2
CANISTER SUCT 3000ML PPV (MISCELLANEOUS) ×2 IMPLANT
CANNULA VESSEL 3MM 2 BLNT TIP (CANNULA) IMPLANT
CATH EMB 3FR 80CM (CATHETERS) ×2 IMPLANT
CLIP APPLIE ROT 10 11.4 M/L (STAPLE) ×1 IMPLANT
CLIP TI WIDE RED SMALL 24 (CLIP) ×2 IMPLANT
CLIP VESOCCLUDE MED 24/CT (CLIP) ×2 IMPLANT
CLIP VESOCCLUDE SM WIDE 24/CT (CLIP) ×2 IMPLANT
COVER PROBE W GEL 5X96 (DRAPES) ×4 IMPLANT
CUFF TOURNIQUET SINGLE 24IN (TOURNIQUET CUFF) IMPLANT
CUFF TOURNIQUET SINGLE 34IN LL (TOURNIQUET CUFF) ×2 IMPLANT
CUFF TOURNIQUET SINGLE 44IN (TOURNIQUET CUFF) IMPLANT
DERMABOND ADVANCED (GAUZE/BANDAGES/DRESSINGS) ×1
DERMABOND ADVANCED .7 DNX12 (GAUZE/BANDAGES/DRESSINGS) ×1 IMPLANT
DRAIN CHANNEL 15F RND FF W/TCR (WOUND CARE) IMPLANT
DRAPE C-ARM 42X72 X-RAY (DRAPES) IMPLANT
DRAPE HALF SHEET 40X57 (DRAPES) IMPLANT
DRSG COVADERM 4X14 (GAUZE/BANDAGES/DRESSINGS) ×2 IMPLANT
DRSG COVADERM 4X6 (GAUZE/BANDAGES/DRESSINGS) ×2 IMPLANT
DRSG COVADERM 4X8 (GAUZE/BANDAGES/DRESSINGS) ×4 IMPLANT
ELECT CAUTERY BLADE 6.4 (BLADE) ×2 IMPLANT
ELECT REM PT RETURN 9FT ADLT (ELECTROSURGICAL) ×4
ELECTRODE REM PT RTRN 9FT ADLT (ELECTROSURGICAL) ×2 IMPLANT
EVACUATOR SILICONE 100CC (DRAIN) IMPLANT
GLOVE BIO SURGEON STRL SZ 6.5 (GLOVE) ×8 IMPLANT
GLOVE BIO SURGEON STRL SZ7.5 (GLOVE) ×4 IMPLANT
GLOVE BIOGEL PI IND STRL 6.5 (GLOVE) ×4 IMPLANT
GLOVE BIOGEL PI IND STRL 7.0 (GLOVE) ×1 IMPLANT
GLOVE BIOGEL PI INDICATOR 6.5 (GLOVE) ×4
GLOVE BIOGEL PI INDICATOR 7.0 (GLOVE) ×1
GLOVE INDICATOR 7.5 STRL GRN (GLOVE) ×2 IMPLANT
GLOVE SURG SS PI 6.5 STRL IVOR (GLOVE) ×4 IMPLANT
GOWN STRL REUS W/ TWL LRG LVL3 (GOWN DISPOSABLE) ×5 IMPLANT
GOWN STRL REUS W/ TWL XL LVL3 (GOWN DISPOSABLE) ×1 IMPLANT
GOWN STRL REUS W/TWL LRG LVL3 (GOWN DISPOSABLE) ×5
GOWN STRL REUS W/TWL XL LVL3 (GOWN DISPOSABLE) ×1
HEMOSTAT SPONGE AVITENE ULTRA (HEMOSTASIS) IMPLANT
INSERT FOGARTY SM (MISCELLANEOUS) ×2 IMPLANT
KIT BASIN OR (CUSTOM PROCEDURE TRAY) ×2 IMPLANT
KIT ROOM TURNOVER OR (KITS) ×2 IMPLANT
MARKER GRAFT CORONARY BYPASS (MISCELLANEOUS) IMPLANT
NS IRRIG 1000ML POUR BTL (IV SOLUTION) ×4 IMPLANT
PACK PERIPHERAL VASCULAR (CUSTOM PROCEDURE TRAY) ×2 IMPLANT
PAD ARMBOARD 7.5X6 YLW CONV (MISCELLANEOUS) ×4 IMPLANT
PENCIL BUTTON HOLSTER BLD 10FT (ELECTRODE) ×2 IMPLANT
SET COLLECT BLD 21X3/4 12 (NEEDLE) IMPLANT
SET MICROPUNCTURE 5F STIFF (MISCELLANEOUS) ×2 IMPLANT
SPONGE LAP 18X18 X RAY DECT (DISPOSABLE) ×8 IMPLANT
STAPLER VISISTAT (STAPLE) ×2 IMPLANT
STAPLER VISISTAT 35W (STAPLE) ×2 IMPLANT
SUT ETHILON 3 0 PS 1 (SUTURE) IMPLANT
SUT GORETEX 6.0 TT13 (SUTURE) IMPLANT
SUT GORETEX 6.0 TT9 (SUTURE) IMPLANT
SUT MNCRL AB 4-0 PS2 18 (SUTURE) ×4 IMPLANT
SUT PROLENE 5 0 C 1 24 (SUTURE) ×10 IMPLANT
SUT PROLENE 6 0 BV (SUTURE) ×10 IMPLANT
SUT PROLENE 6 0 CC (SUTURE) ×4 IMPLANT
SUT SILK 2 0 SH (SUTURE) ×4 IMPLANT
SUT SILK 3 0 (SUTURE) ×2
SUT SILK 3-0 18XBRD TIE 12 (SUTURE) ×2 IMPLANT
SUT VIC AB 2-0 CT1 27 (SUTURE) ×2
SUT VIC AB 2-0 CT1 TAPERPNT 27 (SUTURE) ×2 IMPLANT
SUT VIC AB 3-0 SH 27 (SUTURE) ×4
SUT VIC AB 3-0 SH 27X BRD (SUTURE) ×4 IMPLANT
SUT VIC AB 4-0 PS2 27 (SUTURE) ×4 IMPLANT
SYR 3ML LL SCALE MARK (SYRINGE) ×2 IMPLANT
SYR 5ML LL (SYRINGE) ×2 IMPLANT
TRAY FOLEY W/METER SILVER 16FR (SET/KITS/TRAYS/PACK) ×2 IMPLANT
UNDERPAD 30X30 (UNDERPADS AND DIAPERS) ×2 IMPLANT
WATER STERILE IRR 1000ML POUR (IV SOLUTION) ×2 IMPLANT

## 2017-03-11 NOTE — Progress Notes (Signed)
ANTICOAGULATION CONSULT NOTE - Follow Up Consult  Pharmacy Consult for Heparin Indication: PVD  No Known Allergies  Patient Measurements:  Heparin Dosing Weight: 79 kg  Vital Signs: Temp: 97.6 F (36.4 C) (08/08 0500) Temp Source: Oral (08/08 0500) BP: 92/57 (08/08 0400)  Labs:  Recent Labs  03/09/17 0254  03/09/17 1755 03/10/17 0336 03/10/17 1518 03/11/17 0233  HGB 9.7*  --   --  9.1*  --  8.6*  HCT 34.3*  --   --  32.1*  --  31.4*  PLT 2,065*  --   --  1,898*  --  1,690*  LABPROT 18.3*  --   --  19.9*  --  23.9*  INR 1.50  --   --  1.67  --  2.10  HEPARINUNFRC 0.34  --   --  0.23* 0.37 0.14*  CREATININE  --   < > 2.51* 2.30*  --  1.99*  < > = values in this interval not displayed.  Estimated Creatinine Clearance: 28.6 mL/min (A) (by C-G formula based on SCr of 1.99 mg/dL (H)).   Assessment:  Anticoag: On heparin gtt Coumadin for Afib PTA and now PVD; INR was above goal with last dose of Coumadin taken 8/1. Holding Coumadin> heparin gtt prior to surgery. Did not get FFP/vit k 8/1, vit k ordered 8/2, giving FFP/vit k 8/3. INR 1.4 > 1.5>1.67>2.1 today. HL 0.14 low but not adjusted earlier this AM due to elevated INR. - 8/8: Per vascular note, right foot is cold and more mottled than on previous exam. Right toes are cyanotic. Unable to move right toes and no sensation detected in foot. Do not give any Vit K/FFP currently due to cold foot per T/O with Dr. Donnetta Hutching. Possibly operative exploration today. May have to give some during surgery.  Goal of Therapy:  Heparin level 0.3-0.7 units/ml Monitor platelets by anticoagulation protocol: Yes   Plan:  Heparin 1850 units/hr (no adjustment) Coumadin on hold Possibly operative exploration today. Will f/u afterwards for resumption of heparin   Maryjayne Kleven S. Alford Highland, PharmD, BCPS Clinical Staff Pharmacist Pager [phone]  Eilene Ghazi Stillinger 03/11/2017,11:25 AM

## 2017-03-11 NOTE — Anesthesia Procedure Notes (Signed)
Arterial Line Insertion Start/End8/03/2017 3:15 PM, 03/11/2017 3:30 PM Performed by: Lynford Humphrey, attending, CRNA  Preanesthetic checklist: patient identified, IV checked, site marked, monitors and equipment checked and timeout performed Emergency situation Patient sedated Right, radial was placed Catheter size: 20 G Hand hygiene performed , maximum sterile barriers used  and Seldinger technique used Allen's test indicative of satisfactory collateral circulation Attempts: 3 Procedure performed using ultrasound guided technique. Ultrasound Notes:anatomy identified, needle tip was noted to be adjacent to the nerve/plexus identified and no ultrasound evidence of intravascular and/or intraneural injection Following insertion, Biopatch and dressing applied. Post procedure assessment: normal  Patient tolerated the procedure well with no immediate complications. Additional procedure comments: Difficulty obtaining a-line.  3 attempts.  CRNA attempted on left x2.  Surgeon placed under [place] on right.  Marland Kitchen

## 2017-03-11 NOTE — Progress Notes (Signed)
eLink Physician-Brief Progress Note Patient Name: Charlotte [name] DOB: 07/15/1941 MRN: [id number]   Date of Service  03/11/2017  HPI/Events of Note  Intubated and ventilated - Notified of need for stress ulcer prophylaxis.   eICU Interventions  Will order Protonix IV.      Intervention Category Intermediate Interventions: Best-practice therapies (e.g. DVT, beta blocker, etc.)  Sommer,Steven Eugene 03/11/2017, 8:49 PM

## 2017-03-11 NOTE — Transfer of Care (Signed)
Immediate Anesthesia Transfer of Care Note  Patient: Charlotte [name]  Procedure(s) Performed: Procedure(s): RIGHT FEMORAL-TIBIAL BYPASS IN-SITU GREATER SAPHENOUS VEIN (Right)  Patient Location: SICU  Anesthesia Type:General  Level of Consciousness: sedated and Patient remains intubated per anesthesia plan  Airway & Oxygen Therapy: Patient remains intubated per anesthesia plan and Patient placed on Ventilator (see vital sign flow sheet for setting)  Post-op Assessment: Report given to RN and Post -op Vital signs reviewed and stable  Post vital signs: Reviewed and stable  Last Vitals:  Vitals:   03/11/17 1233 03/11/17 1234  BP:    Pulse:    Resp: 14 16  Temp:      Last Pain:  Vitals:   03/11/17 1220  TempSrc: Oral  PainSc:       Patients Stated Pain Goal: 3 (32/02/33 4356)  Complications: No apparent anesthesia complications

## 2017-03-11 NOTE — Anesthesia Procedure Notes (Signed)
Procedure Name: Intubation Date/Time: 03/11/2017 2:35 PM Performed by: Oletta Lamas Pre-anesthesia Checklist: Patient identified, Emergency Drugs available, Suction available and Patient being monitored Patient Re-evaluated:Patient Re-evaluated prior to induction Oxygen Delivery Method: Circle System Utilized Preoxygenation: Pre-oxygenation with 100% oxygen Induction Type: IV induction Ventilation: Mask ventilation without difficulty Laryngoscope Size: Mac and 4 Grade View: Grade I Tube type: Oral Tube size: 7.5 mm Number of attempts: 1 Airway Equipment and Method: Stylet and Oral airway Placement Confirmation: ETT inserted through vocal cords under direct vision,  positive ETCO2 and breath sounds checked- equal and bilateral Secured at: 22 cm Tube secured with: Tape Dental Injury: Teeth and Oropharynx as per pre-operative assessment

## 2017-03-11 NOTE — Progress Notes (Signed)
  Progress Note    03/11/2017 2:05 PM Day of Surgery  Subjective: severe right foot pain  Vitals:   03/11/17 1233 03/11/17 1234  BP:    Pulse:    Resp: (P) 14 (P) 16  Temp:      Physical Exam: [id number] Palpable femoral pulse 2+ Right foot is cool and mottles without sensation and minimal motor  CBC    Component Value Date/Time   WBC 26.1 (H) 03/11/2017 0233   RBC 4.14 03/11/2017 0233   HGB 8.6 (L) 03/11/2017 0233   HCT 31.4 (L) 03/11/2017 0233   PLT 1,690 (HH) 03/11/2017 0233   MCV 75.8 (L) 03/11/2017 0233   MCH 20.8 (L) 03/11/2017 0233   MCHC 27.4 (L) 03/11/2017 0233   RDW 25.2 (H) 03/11/2017 0233   LYMPHSABS 0.4 (L) 03/07/2017 0443   MONOABS 1.7 (H) 03/07/2017 0443   EOSABS 0.9 (H) 03/07/2017 0443   BASOSABS 0.0 03/07/2017 0443    BMET    Component Value Date/Time   NA 135 03/11/2017 0233   K 3.7 03/11/2017 0233   CL 105 03/11/2017 0233   CO2 18 (L) 03/11/2017 0233   GLUCOSE 85 03/11/2017 0233   BUN 44 (H) 03/11/2017 0233   CREATININE 1.99 (H) 03/11/2017 0233   CREATININE 1.21 (H) 08/14/2016 0733   CALCIUM 8.2 (L) 03/11/2017 0233   GFRNONAA 24 (L) 03/11/2017 0233   GFRAA 27 (L) 03/11/2017 0233    INR    Component Value Date/Time   INR 2.10 03/11/2017 0233     Intake/Output Summary (Last 24 hours) at 03/11/17 1405 Last data filed at 03/11/17 1200  Gross per 24 hour  Intake             2202 ml  Output             1325 ml  Net              877 ml     Assessment:  76 y.o. female is here with now rutherford 2a ischemia, failed previous R fem-ak pop bypass  Plan: OR today for right lower extremity bypass with possible vein and possible graft. She understands that rle may not be salvageable but is willing to proceed and wants everything done to save it.   Brandon C. [name], MD [facility] Office: [phone] Pager: [phone]  03/11/2017 2:05 PM

## 2017-03-11 NOTE — Anesthesia Preprocedure Evaluation (Addendum)
Anesthesia Evaluation  Patient identified by MRN, date of birth, ID band Patient awake    Reviewed: Allergy & Precautions, NPO status , Patient's Chart, lab work & pertinent test results  Airway Mallampati: II  TM Distance: >3 FB     Dental   Pulmonary shortness of breath, pneumonia,    breath sounds clear to auscultation       Cardiovascular hypertension, + Peripheral Vascular Disease and +CHF   Rhythm:Regular Rate:Normal     Neuro/Psych    GI/Hepatic negative GI ROS, Neg liver ROS,   Endo/Other  Hypothyroidism   Renal/GU negative Renal ROS     Musculoskeletal  (+) Arthritis ,   Abdominal   Peds  Hematology  (+) anemia ,   Anesthesia Other Findings   Reproductive/Obstetrics                             Anesthesia Physical Anesthesia Plan  ASA: IV  Anesthesia Plan: General   Post-op Pain Management:    Induction: Intravenous  PONV Risk Score and Plan: 3 and Ondansetron, Dexamethasone, Midazolam and Propofol infusion  Airway Management Planned: Oral ETT  Additional Equipment:   Intra-op Plan:   Post-operative Plan:   Informed Consent: I have reviewed the patients History and Physical, chart, labs and discussed the procedure including the risks, benefits and alternatives for the proposed anesthesia with the patient or authorized representative who has indicated his/her understanding and acceptance.   Dental advisory given  Plan Discussed with: CRNA and Anesthesiologist  Anesthesia Plan Comments:         Anesthesia Quick Evaluation

## 2017-03-11 NOTE — Consult Note (Signed)
PULMONARY / CRITICAL CARE MEDICINE   Name: HARJIT DOUDS MRN: [id number] DOB: [birth year]    ADMISSION DATE:  03/04/2017 CONSULTATION DATE:  03/11/2017  REFERRING MD:  Dr. Clementeen Graham   CHIEF COMPLAINT:  Post-Operative Vent Management   HISTORY OF PRESENT ILLNESS:   76 year old female with PMH of Arthritis, A.Fib (on coumadin), Diastolic HF (EF 60) on home oxygen (2L), Diastolic HF, DVT, HTN, HLD, Hypothyroidism, Peripheral neuropathy, PVD s/p right femoral endarterectomy and femoral to above knee popliteal bypass in 2014, Polycythemia, Varicose Veins   Presents to ED on 8/1 with leg pain for several days. Vascular surgery consulted and patient underwent abdominal aortogram with bilateral lower extremity run off, found to have right leg occlusion of the right superficial femoral popliteal posterior tibial and anterior tibial arteries and reconstitution of diminutive peroneal artery. On 8/8 patient underwent right common femoral to tibioperoneal trunk/peroneal bypass. Post-operatively reminded intubated. PCCM asked to consult.   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  PAST SURGICAL HISTORY: She  has a past surgical history that includes Tubal ligation; Abdominal hysterectomy; Coronary angioplasty; Hip pinning, cannulated (11/19/2011); Fasciotomy (11/29/2011); Dressing change under anesthesia (12/02/2011); Endarterectomy femoral (Right, 09/15/2012); Femoral-popliteal Bypass Graft (Right, 09/15/2012); Patch angioplasty (Right, 09/15/2012); abdominal aortagram (N/A, 09/14/2012); ABDOMINAL AORTOGRAM (N/A, 03/06/2017); and Lower Extremity  Angiography (Bilateral, 03/06/2017).  No Known Allergies  No current facility-administered medications on file prior to encounter.    Current Outpatient Prescriptions on File Prior to Encounter  Medication Sig  . aspirin EC 81 MG tablet Take 81 mg by mouth daily.  Marland Kitchen CARTIA XT 240 MG 24 hr capsule TAKE ONE CAPSULE BY MOUTH ONCE DAILY  . gabapentin (NEURONTIN) 100 MG capsule Take 100 mg by mouth 3 (three) times daily.    Marland Kitchen guaiFENesin (MUCINEX) 600 MG 12 hr tablet Take 1 tablet (600 mg total) by mouth 2 (two) times daily.  . hydroxyurea (HYDREA) 500 MG capsule Take 2 capsules (1,000 mg total) by mouth daily. May take with food to minimize GI side effects.  Marland Kitchen levothyroxine (SYNTHROID, LEVOTHROID) 75 MCG tablet Take 75 mcg by mouth daily before breakfast.   . metolazone (ZAROXOLYN) 2.5 MG tablet Take 2.5 mg if wt gain is 3 lbs or more in 24 hrs (Patient taking differently: Take 2.5 mg by mouth daily as needed (for fluid). Take 2.5 mg if wt gain is 3 lbs or more in 24 hrs)  . metoprolol (LOPRESSOR) 100 MG tablet Take 100 mg by mouth 2 (two) times daily.  . Potassium Chloride ER 20 MEQ TBCR Take 20 mEq by mouth daily.  . pravastatin (PRAVACHOL) 20 MG tablet Take 10 mg by mouth daily.   Marland Kitchen torsemide (DEMADEX) 20 MG tablet Take 2 tablets (40 mg total) by mouth daily.  Marland Kitchen warfarin (COUMADIN) 5 MG tablet Take 1 tablet daily except 1/2 tablet on Mondays, Wednesdays and Fridays (Patient taking differently: Take 2.5-5 mg by mouth See admin instructions. Take 1 tablet daily except 1/2 tablet on Mondays, Wednesdays, Fridays, and Saturdays)  . benzonatate (TESSALON) 100 MG capsule Take 1 capsule (100 mg total) by mouth 3 (three) times daily as needed for cough. (Patient not taking: Reported on 03/04/2017)    FAMILY HISTORY:  Her indicated that her mother is deceased. She indicated that her father is deceased. She indicated that two of her five sisters are alive. She indicated that her brother is alive. She  indicated that her maternal grandmother is deceased. She indicated that her maternal grandfather is deceased. She indicated that her paternal grandmother is deceased. She indicated that her paternal grandfather is deceased. She indicated that the status of her son is unknown.    SOCIAL HISTORY: She  reports that she has never smoked. She has never used smokeless tobacco. She reports that she does not drink alcohol or use drugs.  REVIEW OF SYSTEMS:   Unable to review as patient is sedated and intubated   SUBJECTIVE:   VITAL SIGNS: BP (!) 102/58 (BP Location: Left Arm)   Pulse 98   Temp 98.3 F (36.8 C) (Oral)   Resp 16   Ht 6' (1.829 m)   Wt 79 kg (174 lb 2.6 oz)   SpO2 95%   BMI 23.62 kg/m   HEMODYNAMICS:    VENTILATOR SETTINGS:    INTAKE / OUTPUT: I/O last 3 completed shifts: In: 6192 [P.O.:980; I.V.:3582; Blood:630; IV Piggyback:1000] Out: 2020 [Urine:1770; Blood:250]  PHYSICAL EXAMINATION: General:  Elderly female, no distress  Neuro:  Sedated, does not follow commands, pupils intact  HEENT:  ETT in place Cardiovascular:  Tachy, no MRG Lungs:  Diminished to bases, non-labored, no wheeze  Abdomen:  Non-distended, active bowel sounds  Musculoskeletal:  -edema, right foot cold to touch  Skin:  Dry, intact   LABS:  BMET  Recent Labs Lab 03/09/17 1755  03/10/17 0336 03/10/17 0808 03/11/17 0233  NA 130*  --  134*  --  135  K 4.3  < > 4.0 4.4 3.7  CL 101  --  103  --  105  CO2 20*  --  20*  --  18*  BUN 48*  --  46*  --  44*  CREATININE 2.51*  --  2.30*  --  1.99*  GLUCOSE 91  --  90  --  85  < > = values in this interval not displayed.  Electrolytes  Recent Labs Lab 03/05/17 0224  03/09/17 1755 03/10/17 0336 03/11/17 0233  CALCIUM 8.2*  < > 8.2* 8.2* 8.2*  MG 2.3  --   --   --   --   < > = values in this interval not displayed.  CBC  Recent Labs Lab 03/09/17 0254 03/10/17 0336 03/11/17 0233  WBC 33.0* 25.6* 26.1*  HGB 9.7* 9.1* 8.6*   HCT 34.3* 32.1* 31.4*  PLT 2,065* 1,898* 1,690*    Coag's  Recent Labs Lab 03/09/17 0254 03/10/17 0336 03/11/17 0233  INR 1.50 1.67 2.10    Sepsis Markers No results for input(s): LATICACIDVEN, PROCALCITON, O2SATVEN in the last 168 hours.  ABG No results for input(s): PHART, PCO2ART, PO2ART in the last 168 hours.  Liver Enzymes  Recent Labs Lab 03/05/17 0224  AST 26  ALT 11*  ALKPHOS 126  BILITOT 1.0  ALBUMIN 2.8*    Cardiac Enzymes No results for input(s): TROPONINI, PROBNP in the last 168 hours.  Glucose  Recent Labs Lab 03/09/17 1430  GLUCAP 96    Imaging No results found.   STUDIES:  NM Myocar 8/4 > No decreased activity in the left ventricle on stress imaging to suggest reversible ischemia or infarction. Normal left ventricular wall motion. No left ventricular dilation. Left Ventricular Ejection Fraction: 69 % US renal 8/6 > No evidence  of hydronephrosis, mildly increased renal parenchymal echogenicity may reflect medical renal disease CXR 8/8 >  Moderate bilateral pleural effusions with associated atelectasis, pneumonia or a combination. No convincing pulmonary edema  CULTURES: MRSA by PCR 8/3 > Negative  Staph A. 8/3 > Negative  Sputum 8/3 >>   ANTIBIOTICS: Cefuroxime 8/8 >>   SIGNIFICANT EVENTS: 8/1 > Presents to ED  8/8 > Underwent right lower extremity bypass   LINES/TUBES: ETT 8/8 >> Right Radial 8/8 >>   DISCUSSION: 76 year old female with sever right lower extremity arterial insufficiency s/p bypass on 8/8. Post-operatively remained intubated.   ASSESSMENT / PLAN:  PULMONARY A: Acute on Chronic Hypoxic Respiratory Failure  CXR with lower lobe atelectasis/infiltrate/pleural effusion   -On 2L home oxygen  P:   Vent Support  Trend ABG/CXR VAP Bundle  Wean in AM   CARDIOVASCULAR A:  Severe Right lower extremity arterial insufficiency s/p bypass  H/O PVD, A.Fib, HTN, HLD, diastolic HF  P:  Per Vascular Surgery   Continue Heparin gt (on home coumadin)  Continue home metoprolol  Hold home Cardizem   RENAL A:   Acute on Chronic Kidney Disease Stage 3 (Baseline Crt 1.1-1.3) in setting of contrast > improving  -US renal negative for hydronephrosis  Urinary Retention  Hyperkalemia > resolved  P:   Trend BMP Replace electrolytes as needed  Continue holding home torsemide  Continue foley   GASTROINTESTINAL A:   No issues  P:   NPO PPI  HEMATOLOGIC A:   Polycythemia  Thrombocytosis  P:  Trend CBC Continue home hydroxyurea  Monitor for S&S of Bleeding  Maintain Hbg >7   INFECTIOUS A:   Leukocytosis  P:   Trend WBC and Fever Curve  Send Sputum Culture  Trend Lactic Acid and PCT  Continue Cefuroxime per Vascular   ENDOCRINE A:   Hypothyroidism    P:   Trend Glucose  Continue Synthroid   NEUROLOGIC A:   Acute Encephalopathy secondary to sedation  Peripheral Neuropathy  P:   RASS goal: -1/-2 Wean Precedex down, start fentanyl gtt and wean to achieve RASS    FAMILY  - Updates: no family at bedside   - Inter-disciplinary family meet or Palliative Care meeting due by: 03/18/2017   CC Time: 76 minutes   Hayden Pedro, AGACNP-BC [facility]  Pgr: [phone]  PCCM Pgr: [phone]  Attending Addendum: I personally examined this patient and agree with plan as detailed above. Briefly this is a 4yoF with Afib (on anticoag), Diastolic CHF, CKD, HTN, Chronic Hypoxia, PVD, Polycythemia vera, who has severe PAD with RLE pain from ischemia. She has a failed Right fem-pop bypass and wen to the OR tonight for RLE bypass. Operative note still pending. Patient returns to ICU postoperatively still intubated, acute hypoxic respiratory failure due to RLL infiltrate vs atelectasis and R-pleural effusion. Think she is likely volume overloaded. Has good UOP so will stop the NS gtt. Check BNP. Has AKI-on-CKD with creatinine 1.99 now, down from high of 2.74, up  from baseline of 1.1-1.39. Timeline of the worsening renal function seems to correlate to an arteriogram she had earlier this admission, so suspect this is contrast induced nephropathy. On exam, both legs are cold/mottled/blue with faint pulses (right worse than left). Continue heparin gtt. For her Afib, her HR is currently 100. Restart metoprolol but hold dilt for now as SBP only in 90's. Re-eval in AM if BP better may restart dilt then.   60 minutes critical  care time  Vernie Murders, MD Pulmonary & Critical Care

## 2017-03-11 NOTE — Progress Notes (Signed)
ANTICOAGULATION CONSULT NOTE - Follow Up Consult  Pharmacy Consult for Heparin Indication: PVD  No Known Allergies  Patient Measurements:  Heparin Dosing Weight: 79 kg  Vital Signs: Temp: 98.3 F (36.8 C) (08/08 1220) Temp Source: Oral (08/08 1220) BP: 102/58 (08/08 1220) Pulse Rate: 98 (08/08 1220)  Labs:  Recent Labs  03/09/17 0254  03/09/17 1755 03/10/17 0336 03/10/17 1518 03/11/17 0233  HGB 9.7*  --   --  9.1*  --  8.6*  HCT 34.3*  --   --  32.1*  --  31.4*  PLT 2,065*  --   --  1,898*  --  1,690*  LABPROT 18.3*  --   --  19.9*  --  23.9*  INR 1.50  --   --  1.67  --  2.10  HEPARINUNFRC 0.34  --   --  0.23* 0.37 0.14*  CREATININE  --   < > 2.51* 2.30*  --  1.99*  < > = values in this interval not displayed.  Estimated Creatinine Clearance: 28.6 mL/min (A) (by C-G formula based on SCr of 1.99 mg/dL (H)).   Assessment: 76 yo F presents with leg pain and known chronic occlusion. On heparin gtt for PVD. Now s/p OR for R lower extremity bypass with graft. Had been near low end of goal on heparin 1,850 units/hr prior to surgery. Vascular only wants to run heparin at 500 units/hr after surgery and will follow up in the morning. Hgb down to 8.6, plts elevated at 1690.  Goal of Therapy:  Heparin level 0.3-0.7 units/ml Monitor platelets by anticoagulation protocol: Yes   Plan:  Heparin gtt at 500 units/hr per Vascular Monitor daily heparin level, CBC, s/s of bleed  Would expect heparin level in AM to be low, Rx will continue to monitor and will need to follow up with Vascular in the morning on plan going forward  [name], PharmD, [facility] Clinical Pharmacist Pager [phone] 03/11/2017 8:06 PM

## 2017-03-11 NOTE — Progress Notes (Addendum)
Patient left unit to short stay for emergent surgery. Report called to [place], [place] stay. Reported to Jackelyn Poling that patient had no written orders for consent, type of surgery and surgeon performing surgery. Patient on heparin infusing at 1850 u/hr, family followed patient to short stay. After patient left unit this nurse called Vascular surgeon office, spoke with Arbie Cookey one of the vascular schedulers, gave me a pager number for Dr. [name] [phone]), Dr. [name] called back and this nurse explained above referenced situation. Dr. [name] explained that either he or Dr. Russella Dar would be performing the surgery, consent would be signed in short stay, ok with heparin infusing. Will f/u with patient in short stay.

## 2017-03-11 NOTE — Progress Notes (Addendum)
PROGRESS NOTE                                                                                                                                                                                                             Patient Demographics:    Charlotte [name], is a 76 y.o. female, DOB - 04-Sep-1940, BHA:[id number]  Admit date - 03/04/2017   Admitting Physician Reubin Milan, MD  Outpatient Primary MD for the patient is [name], MD  LOS - 7  Outpatient Specialists:None  Chief Complaint  Patient presents with  . Leg Pain       Brief Narrative   76 year old female with history of chronic atrial fibrillation on anticoagulation, diastolic CHF, hypertension, hypothyroidism, peripheral neuropathy, polycythemia vera and peripheral vascular disease presented with severe right lower leg pain. She was found to have ischemic limb and vascular surgery consulted. She underwent abdominal aortogram with bilateral lower extremity done of and was found to have occlusion of the right superficial femoral, popliteal, posterior tibial and anterior tibial arteries and reconstruction of diminutive peroneal artery. Patient was planned to have right femoral to tibioperoneal trunk bypass for limb salvage on 8/9 but given her worsening pain and critical ischemia she is being taken to the OR today for salvage procedure.   Subjective:   Patient very hard of hearing. Complains of persistent pain in her right leg.   Assessment  & Plan :    Principal Problem:   Lower extremity arterial insufficiency, severe, right (HCC) Secondary to severe peripheral artery disease. Abdominal aortogram pending as above. On IV heparin. Vascular surgery plan on right common femoral to tibioperoneal trunk bypass for limb salvage today.  Patient cleared for surgery by cardiology. Continue pain control.  Active Problems: Acute on chronic kidney disease stage  III Suspect prerenal use of diuretic. Also concerning for urinary retention. Diuretic has been held. Foley placed in. UA negative for infection. Ultrasound abdomen without hydronephrosis. On 8/7 she had urinary retention and required in and out catheterization. Continue to monitor with Foley.    Chronic atrial fibrillation (HCC) Continue rate control with metoprolol and Cardizem. (Monitor with soft blood pressure) . On IV heparin (was on Coumadin at home)    Polycythemia vera ([place]) Chronically elevated platelets. Continue hydroxyurea. Hemoglobin stable. My partner spoke with her hematologist Dr Talbert Cage on the phone  on 8/6 was recommended to continue current treatment and no changes. Leukocytosis improving. Monitor.     Hypothyroidism Continue Synthroid.      Code Status : Full code  Family Communication  : None at bedside   Disposition Plan  :Pending hospital course  Barriers For Discharge : Active symptoms   Consults  :  Vascular surgery  Procedures  :  Abdominal aortogram  Femoropopliteal bypass  DVT Prophylaxis  : IV heparin  Lab Results  Component Value Date   PLT 1,690 (HH) 03/11/2017    Antibiotics  :    Anti-infectives    None        Objective:   Vitals:   03/10/17 2102 03/11/17 0000 03/11/17 0400 03/11/17 0500  BP: (!) 107/55 91/61 (!) 92/57   Pulse:      Resp: (!) 22 19 (!) 9 (!) 22  Temp:    97.6 F (36.4 C)  TempSrc:    Oral  SpO2: 93% 96% 96% 98%  Weight:    79 kg (174 lb 2.6 oz)  Height:        Wt Readings from Last 3 Encounters:  03/11/17 79 kg (174 lb 2.6 oz)  03/03/17 70.3 kg (154 lb 14.4 oz)  02/12/17 69 kg (152 lb 3.2 oz)     Intake/Output Summary (Last 24 hours) at 03/11/17 1227 Last data filed at 03/11/17 0954  Gross per 24 hour  Intake             2202 ml  Output             1075 ml  Net             1127 ml     Physical Exam Gen.: Elderly female not in distress, hard of hearing HEENT: Pallor present, moist mucosa, supple  neck Chest: Clear to auscultation bilaterally   CVS: S1 and S2 irregular irregular, no murmurs  GI: Soft, nondistended, nontender Musculoskeletal: Cold right lower leg with absent pulses and minimal sensations, venous stasis changes bilaterally     Data Review:    CBC  Recent Labs Lab 03/04/17 2159 03/05/17 0224 03/06/17 0329 03/07/17 0443 03/08/17 0654 03/09/17 0254 03/10/17 0336 03/11/17 0233  WBC 46.6* 44.8* 49.1* 42.8* 37.5* 33.0* 25.6* 26.1*  HGB 10.0* 9.1* 9.5* 9.7* 9.7* 9.7* 9.1* 8.6*  HCT 35.8* 33.1* 34.1* 33.9* 34.8* 34.3* 32.1* 31.4*  PLT 1,620* 1,392* 1,591* 1,854* 1,948* 2,065* 1,898* 1,690*  MCV 77.0* 75.9* 75.8* 76.5* 76.3* 76.1* 75.2* 75.8*  MCH 21.5* 20.9* 21.1* 21.9* 21.3* 21.5* 21.3* 20.8*  MCHC 27.9* 27.5* 27.9* 28.6* 27.9* 28.3* 28.3* 27.4*  RDW 25.9* 25.3* 25.4* 26.3* 25.8* 25.6* 25.4* 25.2*  LYMPHSABS 2.8 1.8 3.4 0.4*  --   --   --   --   MONOABS 0.9 0.9 0.5 1.7*  --   --   --   --   EOSABS 0.5 0.9* 1.0* 0.9*  --   --   --   --   BASOSABS 0.5* 2.2* 0.0 0.0  --   --   --   --     Chemistries   Recent Labs Lab 03/05/17 0224  03/08/17 0654 03/09/17 1020 03/09/17 1542 03/09/17 1755 03/09/17 2136 03/09/17 2334 03/10/17 0336 03/10/17 0808 03/11/17 0233  NA 137  < > 133* 132*  --  130*  --   --  134*  --  135  K 2.8*  < > 4.6 6.3* 6.3* 4.3 4.1 3.9 4.0 4.4 3.7  CL 99*  < > 98* 100*  --  101  --   --  103  --  105  CO2 29  < > 22 20*  --  20*  --   --  20*  --  18*  GLUCOSE 108*  < > 106* 97 69 91  --   --  90  --  85  BUN 35*  < > 44* 50*  --  48*  --   --  46*  --  44*  CREATININE 1.55*  < > 2.18* 2.74*  --  2.51*  --   --  2.30*  --  1.99*  CALCIUM 8.2*  < > 8.6* 8.5*  --  8.2*  --   --  8.2*  --  8.2*  MG 2.3  --   --   --   --   --   --   --   --   --   --   AST 26  --   --   --   --   --   --   --   --   --   --   ALT 11*  --   --   --   --   --   --   --   --   --   --   ALKPHOS 126  --   --   --   --   --   --   --   --   --   --    BILITOT 1.0  --   --   --   --   --   --   --   --   --   --   < > = values in this interval not displayed. ------------------------------------------------------------------------------------------------------------------ No results for input(s): CHOL, HDL, LDLCALC, TRIG, CHOLHDL, LDLDIRECT in the last 72 hours.  No results found for: HGBA1C ------------------------------------------------------------------------------------------------------------------ No results for input(s): TSH, T4TOTAL, T3FREE, THYROIDAB in the last 72 hours.  Invalid input(s): FREET3 ------------------------------------------------------------------------------------------------------------------ No results for input(s): VITAMINB12, FOLATE, FERRITIN, TIBC, IRON, RETICCTPCT in the last 72 hours.  Coagulation profile  Recent Labs Lab 03/07/17 0443 03/08/17 0654 03/09/17 0254 03/10/17 0336 03/11/17 0233  INR 1.54 1.41 1.50 1.67 2.10    No results for input(s): DDIMER in the last 72 hours.  Cardiac Enzymes No results for input(s): CKMB, TROPONINI, MYOGLOBIN in the last 168 hours.  Invalid input(s): CK ------------------------------------------------------------------------------------------------------------------    Component Value Date/Time   BNP 608.0 (H) 02/12/2017 1505    Inpatient Medications  Scheduled Meds: . aspirin EC  81 mg Oral Daily  . diltiazem  60 mg Oral Q6H  . gabapentin  100 mg Oral TID  . guaiFENesin  600 mg Oral BID  . hydroxyurea  1,000 mg Oral Q breakfast  . levothyroxine  75 mcg Oral QAC breakfast  . metoprolol tartrate  50 mg Oral BID  . pantoprazole  40 mg Oral Daily  . polyethylene glycol  17 g Oral Daily  . pravastatin  10 mg Oral Daily  . senna-docusate  1 tablet Oral BID   Continuous Infusions: . sodium chloride    . sodium chloride 75 mL/hr at 03/11/17 0954  . heparin 1,850 Units/hr (03/11/17 0954)   PRN Meds:.sodium chloride, acetaminophen **OR**  acetaminophen, diphenhydrAMINE, guaiFENesin-dextromethorphan, hydrALAZINE, HYDROmorphone (DILAUDID) injection, labetalol, metoprolol tartrate, ondansetron **OR** ondansetron (ZOFRAN) IV, oxyCODONE, phenol  Micro Results Recent Results (from the past 240 hour(s))  Surgical PCR  screen     Status: None   Collection Time: 03/06/17 12:54 PM  Result Value Ref Range Status   MRSA, PCR NEGATIVE NEGATIVE Final   Staphylococcus aureus NEGATIVE NEGATIVE Final    Comment:        The Xpert SA Assay (FDA approved for NASAL specimens in patients over 78 years of age), is one component of a comprehensive surveillance program.  Test performance has been validated by [facility] for patients greater than or equal to 46 year old. It is not intended to diagnose infection nor to guide or monitor treatment.     Radiology Reports Dg Chest 2 View  Result Date: 02/12/2017 CLINICAL DATA:  Patient with productive cough for multiple days. EXAM: CHEST  2 VIEW COMPARISON:  Chest radiograph 07/03/2016. FINDINGS: Stable cardiomegaly. Small to moderate bilateral pleural effusions with underlying pulmonary consolidation. No pneumothorax. Thoracic spine degenerative changes. IMPRESSION: Small to moderate bilateral pleural effusions with underlying consolidation which may represent atelectasis or infection. Electronically Signed   By: Lovey Newcomer M.D.   On: 02/12/2017 14:17   US Renal  Result Date: 03/09/2017 CLINICAL DATA:  Acute onset of renal failure.  Initial encounter. EXAM: RENAL / URINARY TRACT ULTRASOUND COMPLETE COMPARISON:  CT of the abdomen and pelvis performed 07/18/2009 FINDINGS: Right Kidney: Length: 9.6 cm. Mildly increased renal parenchymal echogenicity is suggested. No mass or hydronephrosis visualized. Left Kidney: Length: 10.4 cm. Mildly increased renal parenchymal echogenicity is suggested. Left-sided pelvicaliectasis remains within normal limits. No mass or hydronephrosis visualized. Bladder:  Decompressed and not well assessed. IMPRESSION: 1. No evidence of hydronephrosis. 2. Mildly increased renal parenchymal echogenicity may reflect medical renal disease. Electronically Signed   By: Garald Balding M.D.   On: 03/09/2017 21:21   Nm Myocar Multi W/spect W/wall Motion / Ef  Result Date: 03/07/2017 CLINICAL DATA:  Atrial fibrillation. Hypertension and hyperlipidemia. EXAM: MYOCARDIAL IMAGING WITH SPECT (REST AND PHARMACOLOGIC-STRESS) GATED LEFT VENTRICULAR WALL MOTION STUDY LEFT VENTRICULAR EJECTION FRACTION TECHNIQUE: Standard myocardial SPECT imaging was performed after resting intravenous injection of 10 mCi Tc-31m tetrofosmin. Subsequently, intravenous infusion of Lexiscan was performed under the supervision of the Cardiology staff. At peak effect of the drug, 30 mCi Tc-74m tetrofosmin was injected intravenously and standard myocardial SPECT imaging was performed. Quantitative gated imaging was also performed to evaluate left ventricular wall motion, and estimate left ventricular ejection fraction. COMPARISON:  None. FINDINGS: Perfusion: No decreased activity in the left ventricle on stress imaging to suggest reversible ischemia or infarction. Wall Motion: Normal left ventricular wall motion. No left ventricular dilation. Left Ventricular Ejection Fraction: 69 % End diastolic volume 88 ml End systolic volume 27 ml IMPRESSION: 1. No reversible ischemia or infarction. 2. Normal left ventricular wall motion. 3. Left ventricular ejection fraction 69% 4. Non invasive risk stratification*: Low *2012 Appropriate Use Criteria for Coronary Revascularization Focused Update: J Am Coll Cardiol. 5329;92(4):268-341. http://content.airportbarriers.com.aspx?articleid=1201161 Electronically Signed   By: Earle Gell M.D.   On: 03/07/2017 14:34    Time Spent in minutes  35   Louellen Molder M.D on 03/11/2017 at 12:27 PM  Between 7am to 7pm - Pager - [phone]  After 7pm go to [email or web address] - password  [facility]  [facility] -  Office  [phone]

## 2017-03-11 NOTE — Consult Note (Signed)
   [facility] Inpatient Consult   03/11/2017  Charlotte [name] March 16, 1941 [id number]   Screened for potential [facility] Care Management program services.   Spoke with inpatient RNCM about potential disposition plans. Discussed potential [facility] program pending hospital course and pending surgery. Too early to tell what the plan will be for post hospital discharge.   Will continue to follow and engage for potential [facility] Care Management services if and when appropriate.    Marthenia Rolling, MSN-Ed, RN,BSN [facility] Liaison [phone]

## 2017-03-11 NOTE — Progress Notes (Signed)
Dr. Oneida Alar notified of absent pulses in right lower extremity, MD to assess at bedside.

## 2017-03-11 NOTE — Brief Op Note (Signed)
03/04/2017 - 03/11/2017  9:12 PM  PATIENT:  Charlotte [name]  76 y.o. female  PRE-OPERATIVE DIAGNOSIS:  Peripheral Vascular Disease with Right Lower Extremity Rest Pain  I70.221  POST-OPERATIVE DIAGNOSIS:  Peripheral Vascular Disease with Right Lower Extremity Rest Pain    PROCEDURE:  1. US guided placement of right radial arterial monitoring line 2. Harvest of right greater saphenous vein 3. Redo exposure of right common femoral artery > 30 days 4. Right femoral to tp trunk bypass with in situ gsv  SURGEON:  Surgeon(s) and Role:    * [name], Georgia Dom, MD - Primary    * Fields, Jessy Oto, MD - Assisting  PHYSICIAN ASSISTANT: Leontine Locket, PA  ANESTHESIA:   general  EBL:  250cc  BLOOD ADMINISTERED:2 units CC PRBC  DRAINS: none   TOURNIQUET:   Total Tourniquet Time Documented: Thigh (Right) - 18 minutes Total: Thigh (Right) - 18 minutes   PATIENT DISPOSITION:  ICU - extubated and stable.   Delay start of Pharmacological VTE agent (>24hrs) due to surgical

## 2017-03-11 NOTE — Progress Notes (Addendum)
  Progress Note  SUBJECTIVE:    Had a lot of pain right foot yesterday afternoon.   OBJECTIVE:   Vitals:   03/11/17 0400 03/11/17 0500  BP: (!) 92/57   Pulse:    Resp: (!) 9 (!) 22  Temp:  97.6 F (36.4 C)    Intake/Output Summary (Last 24 hours) at 03/11/17 0739 Last data filed at 03/11/17 6384  Gross per 24 hour  Intake             1737 ml  Output             1075 ml  Net              662 ml   Doppler not working in room.  Right foot is cold and appears more mottled than on previous exam. Right toes are cyanotic. Unable to move right toes and no sensation detected in foot. This is different from exam yesterday.   ASSESSMENT/PLAN:   76 y.o. female with severe rest pain right foot.  5 Days Post-Op   Right foot is cold and mottled this am. She has no motor or sensory function of her right foot compared to yesterday's exam. Is scheduled for bypass surgery tomorrow but given state of right foot today, may need to cancel. Dr. Donnetta Hutching to see.   Alvia Grove 03/11/2017 7:39 AM -- LABS:   CBC    Component Value Date/Time   WBC 26.1 (H) 03/11/2017 0233   HGB 8.6 (L) 03/11/2017 0233   HCT 31.4 (L) 03/11/2017 0233   PLT 1,690 (HH) 03/11/2017 0233    BMET    Component Value Date/Time   NA 135 03/11/2017 0233   K 3.7 03/11/2017 0233   CL 105 03/11/2017 0233   CO2 18 (L) 03/11/2017 0233   GLUCOSE 85 03/11/2017 0233   BUN 44 (H) 03/11/2017 0233   CREATININE 1.99 (H) 03/11/2017 0233   CREATININE 1.21 (H) 08/14/2016 0733   CALCIUM 8.2 (L) 03/11/2017 0233   GFRNONAA 24 (L) 03/11/2017 0233   GFRAA 27 (L) 03/11/2017 0233    COAG Lab Results  Component Value Date   INR 2.10 03/11/2017   INR 1.67 03/10/2017   INR 1.50 03/09/2017   No results found for: PTT  ANTIBIOTICS:   Anti-infectives    None       Virgina Jock, PA-C [facility] Office: [phone] Pager: [phone] 03/11/2017 7:39 AM  I have examined the patient, reviewed  and agree with above.Significant change in her foot exam since yesterday. Foot now cold with no motor or sensory function. Some tenderness in her calf as well. Discussed with patient. Will make her in when necessary plan operative exploration today. Very difficult management situation with the high risk for above-knee amputation  Curt Jews, MD 03/11/2017 8:52 AM

## 2017-03-12 ENCOUNTER — Inpatient Hospital Stay (HOSPITAL_COMMUNITY): Payer: Medicare Other

## 2017-03-12 ENCOUNTER — Encounter (HOSPITAL_COMMUNITY): Payer: Self-pay | Admitting: Vascular Surgery

## 2017-03-12 DIAGNOSIS — N170 Acute kidney failure with tubular necrosis: Secondary | ICD-10-CM

## 2017-03-12 DIAGNOSIS — N179 Acute kidney failure, unspecified: Secondary | ICD-10-CM

## 2017-03-12 DIAGNOSIS — J9601 Acute respiratory failure with hypoxia: Secondary | ICD-10-CM

## 2017-03-12 LAB — CBC
HCT: 35.1 % — ABNORMAL LOW (ref 36.0–46.0)
HEMOGLOBIN: 10.4 g/dL — AB (ref 12.0–15.0)
MCH: 23.3 pg — AB (ref 26.0–34.0)
MCHC: 29.6 g/dL — ABNORMAL LOW (ref 30.0–36.0)
MCV: 78.5 fL (ref 78.0–100.0)
PLATELETS: 1767 10*3/uL — AB (ref 150–400)
RBC: 4.47 MIL/uL (ref 3.87–5.11)
RDW: 24.7 % — ABNORMAL HIGH (ref 11.5–15.5)
WBC: 23.4 10*3/uL — AB (ref 4.0–10.5)

## 2017-03-12 LAB — PHOSPHORUS
PHOSPHORUS: 4.3 mg/dL (ref 2.5–4.6)
PHOSPHORUS: 5 mg/dL — AB (ref 2.5–4.6)
Phosphorus: 5.3 mg/dL — ABNORMAL HIGH (ref 2.5–4.6)

## 2017-03-12 LAB — POCT I-STAT 3, ART BLOOD GAS (G3+)
Acid-base deficit: 2 mmol/L (ref 0.0–2.0)
Bicarbonate: 23 mmol/L (ref 20.0–28.0)
O2 Saturation: 91 %
TCO2: 24 mmol/L (ref 0–100)
pCO2 arterial: 41.6 mmHg (ref 32.0–48.0)
pH, Arterial: 7.351 (ref 7.350–7.450)
pO2, Arterial: 64 mmHg — ABNORMAL LOW (ref 83.0–108.0)

## 2017-03-12 LAB — BASIC METABOLIC PANEL
ANION GAP: 12 (ref 5–15)
Anion gap: 10 (ref 5–15)
BUN: 36 mg/dL — ABNORMAL HIGH (ref 6–20)
BUN: 42 mg/dL — AB (ref 6–20)
CALCIUM: 8.1 mg/dL — AB (ref 8.9–10.3)
CALCIUM: 8.3 mg/dL — AB (ref 8.9–10.3)
CO2: 21 mmol/L — AB (ref 22–32)
CO2: 19 mmol/L — ABNORMAL LOW (ref 22–32)
CREATININE: 1.73 mg/dL — AB (ref 0.44–1.00)
Chloride: 107 mmol/L (ref 101–111)
Chloride: 107 mmol/L (ref 101–111)
Creatinine, Ser: 2.31 mg/dL — ABNORMAL HIGH (ref 0.44–1.00)
GFR calc Af Amer: 32 mL/min — ABNORMAL LOW (ref >60)
GFR calc Af Amer: 23 mL/min — ABNORMAL LOW (ref >60)
GFR calc non Af Amer: 28 mL/min — ABNORMAL LOW (ref >60)
GFR, EST NON AFRICAN AMERICAN: 20 mL/min — AB (ref >60)
GLUCOSE: 100 mg/dL — AB (ref 65–99)
GLUCOSE: 166 mg/dL — AB (ref 65–99)
Potassium: 3 mmol/L — ABNORMAL LOW (ref 3.5–5.1)
Potassium: 4.7 mmol/L (ref 3.5–5.1)
Sodium: 140 mmol/L (ref 135–145)
Sodium: 136 mmol/L (ref 135–145)

## 2017-03-12 LAB — POCT I-STAT 7, (LYTES, BLD GAS, ICA,H+H)
ACID-BASE DEFICIT: 3 mmol/L — AB (ref 0.0–2.0)
ACID-BASE DEFICIT: 5 mmol/L — AB (ref 0.0–2.0)
Acid-base deficit: 12 mmol/L — ABNORMAL HIGH (ref 0.0–2.0)
Bicarbonate: 15.8 mmol/L — ABNORMAL LOW (ref 20.0–28.0)
Bicarbonate: 22 mmol/L (ref 20.0–28.0)
Bicarbonate: 19.8 mmol/L — ABNORMAL LOW (ref 20.0–28.0)
Calcium, Ion: 1.08 mmol/L — ABNORMAL LOW (ref 1.15–1.40)
Calcium, Ion: 1.17 mmol/L (ref 1.15–1.40)
Calcium, Ion: 1.14 mmol/L — ABNORMAL LOW (ref 1.15–1.40)
HCT: 33 % — ABNORMAL LOW (ref 36.0–46.0)
HEMATOCRIT: 35 % — AB (ref 36.0–46.0)
HEMATOCRIT: 36 % (ref 36.0–46.0)
HEMOGLOBIN: 12.2 g/dL (ref 12.0–15.0)
HEMOGLOBIN: 11.2 g/dL — AB (ref 12.0–15.0)
Hemoglobin: 11.9 g/dL — ABNORMAL LOW (ref 12.0–15.0)
O2 SAT: 93 %
O2 SAT: 94 %
O2 Saturation: 82 %
PCO2 ART: 38.5 mmHg (ref 32.0–48.0)
PCO2 ART: 40.6 mmHg (ref 32.0–48.0)
PCO2 ART: 33.9 mmHg (ref 32.0–48.0)
PH ART: 7.365 (ref 7.350–7.450)
PO2 ART: 73 mmHg — AB (ref 83.0–108.0)
POTASSIUM: 3.7 mmol/L (ref 3.5–5.1)
POTASSIUM: 3.1 mmol/L — AB (ref 3.5–5.1)
Potassium: 3.2 mmol/L — ABNORMAL LOW (ref 3.5–5.1)
SODIUM: 136 mmol/L (ref 135–145)
SODIUM: 139 mmol/L (ref 135–145)
Sodium: 140 mmol/L (ref 135–145)
TCO2: 17 mmol/L (ref 0–100)
TCO2: 23 mmol/L (ref 0–100)
TCO2: 21 mmol/L (ref 0–100)
pH, Arterial: 7.197 — CL (ref 7.350–7.450)
pH, Arterial: 7.376 (ref 7.350–7.450)
pO2, Arterial: 56 mmHg — ABNORMAL LOW (ref 83.0–108.0)
pO2, Arterial: 70 mmHg — ABNORMAL LOW (ref 83.0–108.0)

## 2017-03-12 LAB — PROCALCITONIN
PROCALCITONIN: 0.74 ng/mL
PROCALCITONIN: 0.75 ng/mL

## 2017-03-12 LAB — MAGNESIUM
Magnesium: 2.1 mg/dL (ref 1.7–2.4)
Magnesium: 2.2 mg/dL (ref 1.7–2.4)
Magnesium: 2 mg/dL (ref 1.7–2.4)

## 2017-03-12 LAB — PROTIME-INR
INR: 2.3
Prothrombin Time: 25.7 seconds — ABNORMAL HIGH (ref 11.4–15.2)

## 2017-03-12 LAB — CORTISOL: CORTISOL PLASMA: 32.4 ug/dL

## 2017-03-12 LAB — GLUCOSE, CAPILLARY
GLUCOSE-CAPILLARY: 103 mg/dL — AB (ref 65–99)
GLUCOSE-CAPILLARY: 137 mg/dL — AB (ref 65–99)
Glucose-Capillary: 122 mg/dL — ABNORMAL HIGH (ref 65–99)

## 2017-03-12 LAB — LACTIC ACID, PLASMA
LACTIC ACID, VENOUS: 1.5 mmol/L (ref 0.5–1.9)
Lactic Acid, Venous: 0.9 mmol/L (ref 0.5–1.9)
Lactic Acid, Venous: 3 mmol/L (ref 0.5–1.9)

## 2017-03-12 LAB — TROPONIN I: Troponin I: 0.16 ng/mL (ref <0.03)

## 2017-03-12 LAB — HEPARIN LEVEL (UNFRACTIONATED)

## 2017-03-12 MED ORDER — PRO-STAT SUGAR FREE PO LIQD
30.0000 mL | Freq: Three times a day (TID) | ORAL | Status: DC
  Administered 2017-03-12 – 2017-03-20 (×22): 30 mL
  Filled 2017-03-12 (×22): qty 30

## 2017-03-12 MED ORDER — SENNOSIDES-DOCUSATE SODIUM 8.6-50 MG PO TABS
1.0000 | ORAL_TABLET | Freq: Two times a day (BID) | ORAL | Status: DC
  Administered 2017-03-12 – 2017-03-14 (×3): 1
  Filled 2017-03-12 (×4): qty 1

## 2017-03-12 MED ORDER — PRAVASTATIN SODIUM 20 MG PO TABS
10.0000 mg | ORAL_TABLET | Freq: Every day | ORAL | Status: DC
  Administered 2017-03-12 – 2017-03-14 (×3): 10 mg
  Filled 2017-03-12 (×3): qty 1

## 2017-03-12 MED ORDER — SODIUM CHLORIDE 0.9% FLUSH
10.0000 mL | Freq: Two times a day (BID) | INTRAVENOUS | Status: DC
  Administered 2017-03-12 – 2017-03-20 (×14): 10 mL
  Administered 2017-03-21: 20 mL
  Administered 2017-03-22: 10 mL
  Administered 2017-03-22: 40 mL
  Administered 2017-03-23 – 2017-03-27 (×6): 10 mL
  Administered 2017-03-28: 30 mL
  Administered 2017-03-28 – 2017-03-29 (×2): 10 mL

## 2017-03-12 MED ORDER — AMIODARONE HCL 200 MG PO TABS
400.0000 mg | ORAL_TABLET | Freq: Two times a day (BID) | ORAL | Status: DC
  Administered 2017-03-12: 400 mg
  Filled 2017-03-12: qty 2

## 2017-03-12 MED ORDER — VANCOMYCIN HCL 10 G IV SOLR
1750.0000 mg | Freq: Once | INTRAVENOUS | Status: AC
  Administered 2017-03-12: 1750 mg via INTRAVENOUS
  Filled 2017-03-12: qty 1750

## 2017-03-12 MED ORDER — AMIODARONE LOAD VIA INFUSION
150.0000 mg | Freq: Once | INTRAVENOUS | Status: AC
  Administered 2017-03-12: 150 mg via INTRAVENOUS
  Filled 2017-03-12: qty 83.34

## 2017-03-12 MED ORDER — FUROSEMIDE 10 MG/ML IJ SOLN
40.0000 mg | Freq: Every day | INTRAMUSCULAR | Status: DC
  Filled 2017-03-12: qty 4

## 2017-03-12 MED ORDER — HYDROXYUREA 100 MG/ML ORAL SUSPENSION
1000.0000 mg | Freq: Every day | ORAL | Status: DC
  Administered 2017-03-13 – 2017-03-20 (×7): 1000 mg
  Filled 2017-03-12 (×11): qty 10

## 2017-03-12 MED ORDER — DEXTROSE 5 % IV SOLN
2.0000 g | INTRAVENOUS | Status: DC
  Administered 2017-03-12 – 2017-03-14 (×3): 2 g via INTRAVENOUS
  Filled 2017-03-12 (×3): qty 2

## 2017-03-12 MED ORDER — FENTANYL BOLUS VIA INFUSION
25.0000 ug | INTRAVENOUS | Status: DC | PRN
  Filled 2017-03-12: qty 25

## 2017-03-12 MED ORDER — AMIODARONE HCL 200 MG PO TABS
400.0000 mg | ORAL_TABLET | Freq: Two times a day (BID) | ORAL | Status: DC
  Filled 2017-03-12: qty 2

## 2017-03-12 MED ORDER — DOCUSATE SODIUM 50 MG/5ML PO LIQD
100.0000 mg | Freq: Every day | ORAL | Status: DC
  Administered 2017-03-12 – 2017-03-18 (×3): 100 mg
  Filled 2017-03-12 (×4): qty 10

## 2017-03-12 MED ORDER — ORAL CARE MOUTH RINSE
15.0000 mL | OROMUCOSAL | Status: DC
  Administered 2017-03-12 – 2017-03-21 (×83): 15 mL via OROMUCOSAL

## 2017-03-12 MED ORDER — POTASSIUM CHLORIDE 10 MEQ/100ML IV SOLN
10.0000 meq | INTRAVENOUS | Status: AC
  Administered 2017-03-12 (×6): 10 meq via INTRAVENOUS
  Filled 2017-03-12 (×6): qty 100

## 2017-03-12 MED ORDER — SODIUM CHLORIDE 0.9 % IV SOLN
0.0000 ug/min | INTRAVENOUS | Status: DC
  Administered 2017-03-12: 70 ug/min via INTRAVENOUS
  Filled 2017-03-12 (×4): qty 1

## 2017-03-12 MED ORDER — SODIUM CHLORIDE 0.9% FLUSH: 10.0000 mL | INTRAVENOUS | Status: DC | PRN

## 2017-03-12 MED ORDER — CHLORHEXIDINE GLUCONATE 0.12% ORAL RINSE (MEDLINE KIT)
15.0000 mL | Freq: Two times a day (BID) | OROMUCOSAL | Status: DC
  Administered 2017-03-12 – 2017-03-21 (×18): 15 mL via OROMUCOSAL

## 2017-03-12 MED ORDER — AMIODARONE IV BOLUS ONLY 150 MG/100ML
150.0000 mg | Freq: Once | INTRAVENOUS | Status: AC
  Administered 2017-03-12: 150 mg via INTRAVENOUS

## 2017-03-12 MED ORDER — GABAPENTIN 250 MG/5ML PO SOLN
100.0000 mg | Freq: Three times a day (TID) | ORAL | Status: DC
Start: 2017-03-12 — End: 2017-03-20
  Administered 2017-03-12 – 2017-03-20 (×21): 100 mg
  Filled 2017-03-12 (×26): qty 2

## 2017-03-12 MED ORDER — POTASSIUM CHLORIDE CRYS ER 20 MEQ PO TBCR
40.0000 meq | EXTENDED_RELEASE_TABLET | Freq: Once | ORAL | Status: DC
  Filled 2017-03-12: qty 2

## 2017-03-12 MED ORDER — GUAIFENESIN 100 MG/5ML PO SOLN
20.0000 mL | Freq: Three times a day (TID) | ORAL | Status: DC
  Administered 2017-03-12 – 2017-03-18 (×17): 400 mg
  Filled 2017-03-12: qty 5
  Filled 2017-03-12 (×5): qty 20
  Filled 2017-03-12: qty 5
  Filled 2017-03-12 (×2): qty 20
  Filled 2017-03-12 (×2): qty 5
  Filled 2017-03-12: qty 20
  Filled 2017-03-12: qty 5
  Filled 2017-03-12 (×2): qty 20
  Filled 2017-03-12: qty 15
  Filled 2017-03-12: qty 10
  Filled 2017-03-12 (×2): qty 20

## 2017-03-12 MED ORDER — DEXTROSE 5 % IV SOLN
0.0000 ug/min | INTRAVENOUS | Status: DC
  Administered 2017-03-12: 20 ug/min via INTRAVENOUS
  Administered 2017-03-12: 2 ug/min via INTRAVENOUS
  Administered 2017-03-12: 20 ug/min via INTRAVENOUS
  Administered 2017-03-12: 16 ug/min via INTRAVENOUS
  Administered 2017-03-13: 20 ug/min via INTRAVENOUS
  Administered 2017-03-13: 22 ug/min via INTRAVENOUS
  Filled 2017-03-12 (×11): qty 4

## 2017-03-12 MED ORDER — VITAL HIGH PROTEIN PO LIQD
1000.0000 mL | ORAL | Status: DC
  Administered 2017-03-12: 1000 mL

## 2017-03-12 MED ORDER — VITAL AF 1.2 CAL PO LIQD
1000.0000 mL | ORAL | Status: DC
  Administered 2017-03-12 – 2017-03-17 (×3): 1000 mL

## 2017-03-12 MED ORDER — AMIODARONE HCL IN DEXTROSE 360-4.14 MG/200ML-% IV SOLN
60.0000 mg/h | INTRAVENOUS | Status: DC
  Administered 2017-03-12 (×2): 60 mg/h via INTRAVENOUS
  Filled 2017-03-12: qty 200

## 2017-03-12 MED ORDER — PRO-STAT SUGAR FREE PO LIQD
30.0000 mL | Freq: Two times a day (BID) | ORAL | Status: DC
  Administered 2017-03-12: 30 mL
  Filled 2017-03-12: qty 30

## 2017-03-12 MED ORDER — INSULIN ASPART 100 UNIT/ML ~~LOC~~ SOLN
0.0000 [IU] | SUBCUTANEOUS | Status: DC
  Administered 2017-03-13: 2 [IU] via SUBCUTANEOUS
  Administered 2017-03-13: 3 [IU] via SUBCUTANEOUS
  Administered 2017-03-13 – 2017-03-14 (×5): 2 [IU] via SUBCUTANEOUS
  Administered 2017-03-14: 3 [IU] via SUBCUTANEOUS
  Administered 2017-03-14 – 2017-03-23 (×9): 2 [IU] via SUBCUTANEOUS

## 2017-03-12 MED ORDER — AMIODARONE HCL IN DEXTROSE 360-4.14 MG/200ML-% IV SOLN
30.0000 mg/h | INTRAVENOUS | Status: DC
  Administered 2017-03-12 – 2017-03-17 (×10): 30 mg/h via INTRAVENOUS
  Filled 2017-03-12 (×13): qty 200

## 2017-03-12 MED ORDER — CHLORHEXIDINE GLUCONATE CLOTH 2 % EX PADS
6.0000 | MEDICATED_PAD | Freq: Every day | CUTANEOUS | Status: DC
  Administered 2017-03-12 – 2017-03-13 (×2): 6 via TOPICAL

## 2017-03-12 MED ORDER — ASPIRIN 81 MG PO CHEW
81.0000 mg | CHEWABLE_TABLET | Freq: Every day | ORAL | Status: DC
  Administered 2017-03-12 – 2017-03-20 (×8): 81 mg
  Filled 2017-03-12 (×8): qty 1

## 2017-03-12 MED ORDER — VANCOMYCIN HCL 10 G IV SOLR
1250.0000 mg | INTRAVENOUS | Status: DC
  Filled 2017-03-12: qty 1250

## 2017-03-12 MED ORDER — POTASSIUM CHLORIDE 20 MEQ/15ML (10%) PO SOLN
40.0000 meq | Freq: Once | ORAL | Status: AC
  Administered 2017-03-12: 40 meq
  Filled 2017-03-12: qty 30

## 2017-03-12 NOTE — Progress Notes (Signed)
Pharmacy Antibiotic Note  Charlotte [name] is a 76 y.o. female admitted on 03/04/2017 with pneumonia.  Pharmacy has been consulted for Cefepime and Vancomycin dosing.   SCr 1.73 (BL~ 1.1), WBC 26.7, Est. CrCl ~ 30-35 mL/min   Plan: -Vancomycin 1750 mg IV x 1 dose then Vancomycin 1250 IV every 24 hours.  Goal trough 15-20 mcg/mL.  -Cefepime 2 gm IV Q 24 hours  -Monitor CBC, renal fx, cultures and clinical progress -VT at [facility]   Height: 6' (182.9 cm) Weight: 195 lb 12.3 oz (88.8 kg) IBW/kg (Calculated) : 73.1  Temp (24hrs), Avg:98.3 F (36.8 C), Min:97.5 F (36.4 C), Max:99.2 F (37.3 C)   Recent Labs Lab 03/07/17 0443 03/08/17 0654 03/09/17 0254 03/09/17 1020 03/09/17 1755 03/10/17 0336 03/11/17 0233 03/11/17 2115 03/11/17 2343 03/12/17 0433  WBC 42.8* 37.5* 33.0*  --   --  25.6* 26.1*  --   --   --   CREATININE 1.73* 2.18*  --  2.74* 2.51* 2.30* 1.99*  --   --  1.73*  LATICACIDVEN  --   --   --   --   --   --   --  0.9 0.9  --     Estimated Creatinine Clearance: 35.8 mL/min (A) (by C-G formula based on SCr of 1.73 mg/dL (H)).    No Known Allergies  Antimicrobials this admission: Vanc 8/9 >>  Cefepime 8/9 >>   Dose adjustments this admission: None   Microbiology results: 8/8 Trach asp >> rare GPC 8/3 MRSA PCR: neg  Thank you for allowing pharmacy to be a part of this patient's care.  Albertina Parr, PharmD., BCPS Clinical Pharmacist Pager [phone]

## 2017-03-12 NOTE — Procedures (Signed)
Central Venous Catheter Insertion Procedure Note Charlotte [name] [id number] 13-Aug-1940  Procedure: Insertion of Central Venous Catheter Indications: Assessment of intravascular volume  Procedure Details Consent: Risks of procedure as well as the alternatives and risks of each were explained to the (patient/caregiver).  Consent for procedure obtained. Time Out: Verified patient identification, verified procedure, site/side was marked, verified correct patient position, special equipment/implants available, medications/allergies/relevent history reviewed, required imaging and test results available.  Performed  Maximum sterile technique was used including antiseptics, cap, gloves, gown, hand hygiene, mask and sheet. Skin prep: Chlorhexidine; local anesthetic administered A antimicrobial bonded/coated triple lumen catheter was placed in the left subclavian vein using the Seldinger technique.  Ultrasound was used to verify the patency of the vein and for real time needle guidance.  Evaluation Blood flow good Complications: No apparent complications Patient did tolerate procedure well. Chest X-ray ordered to verify placement.  CXR: pending.  Simonne Maffucci 03/12/2017, 9:20 AM

## 2017-03-12 NOTE — Progress Notes (Signed)
  Progress Note    03/12/2017 7:52 AM 1 Day Post-Op  Subjective: remains intubated, sedated  Vitals:   03/12/17 0645 03/12/17 0700  BP:  98/62  Pulse: (!) 53 (!) 113  Resp: 12 12  Temp:      Physical Exam: Intubated, minimally responsive Incisions are cdi rle with dressings in tact Bypass has strong signal in mid thigh Foot is warmer although remains cool in forefoot, cannot assess neurologic function  CBC    Component Value Date/Time   WBC 26.1 (H) 03/11/2017 0233   RBC 4.14 03/11/2017 0233   HGB 11.2 (L) 03/11/2017 1831   HCT 33.0 (L) 03/11/2017 1831   PLT 1,690 (HH) 03/11/2017 0233   MCV 75.8 (L) 03/11/2017 0233   MCH 20.8 (L) 03/11/2017 0233   MCHC 27.4 (L) 03/11/2017 0233   RDW 25.2 (H) 03/11/2017 0233   LYMPHSABS 0.4 (L) 03/07/2017 0443   MONOABS 1.7 (H) 03/07/2017 0443   EOSABS 0.9 (H) 03/07/2017 0443   BASOSABS 0.0 03/07/2017 0443    BMET    Component Value Date/Time   NA 140 03/12/2017 0433   K 3.0 (L) 03/12/2017 0433   CL 107 03/12/2017 0433   CO2 21 (L) 03/12/2017 0433   GLUCOSE 100 (H) 03/12/2017 0433   BUN 36 (H) 03/12/2017 0433   CREATININE 1.73 (H) 03/12/2017 0433   CREATININE 1.21 (H) 08/14/2016 0733   CALCIUM 8.1 (L) 03/12/2017 0433   GFRNONAA 28 (L) 03/12/2017 0433   GFRAA 32 (L) 03/12/2017 0433    INR    Component Value Date/Time   INR 2.30 03/12/2017 0433     Intake/Output Summary (Last 24 hours) at 03/12/17 0752 Last data filed at 03/12/17 0647  Gross per 24 hour  Intake          4744.42 ml  Output             1175 ml  Net          3569.42 ml     Assessment/plan:  76 y.o. female is s/p right femoral to tp trunk bypass. The bypass appears patent by doppler in the thigh but she does not have large vessel runoff to the foot. The foot is warmer but has unknown viability until we can assess the neuro status. She will be at high risk of needs aka and I will communicate this to the family when they are available. pccm managing vent  and appreciated. No plans for further surgery at this time, heparin can be held with therapeutic inr. Needs aspirin.     Edithe Dobbin C. [name], MD [facility] [place] Office: [phone] Pager: [phone]  03/12/2017 7:52 AM

## 2017-03-12 NOTE — Progress Notes (Signed)
OT Cancellation Note  Patient Details Name: Charlotte [name] MRN: [id number] DOB: 1940-11-21   Cancelled Treatment:    Reason Eval/Treat Not Completed: Patient not medically ready. Spoke to RN--pt still intubated,sedated, and on pressors. Will re-attempt eval at later time.  Almon Register [phone] 03/12/2017, 8:08 AM

## 2017-03-12 NOTE — Progress Notes (Signed)
eLink Physician-Brief Progress Note Patient Name: Charlotte [name] DOB: [birth year] MRN: [id number]   Date of Service  03/12/2017  HPI/Events of Note  Hypoxia - Sat = 92% AM CXR read as atelectasis. and 2. Troponin = 0.16  eICU Interventions  Will order: 1. Increase PEEP to 10.     Intervention Category Major Interventions: Hypoxemia - evaluation and management  Sommer,Steven Eugene 03/12/2017, 6:26 PM

## 2017-03-12 NOTE — Progress Notes (Signed)
eLink Physician-Brief Progress Note Patient Name: Charlotte [name] DOB: July 08, 1941 MRN: [id number]   Date of Service  03/12/2017  HPI/Events of Note  Hyperglycemia  eICU Interventions  SSI - moderate scale ordered     Intervention Category Intermediate Interventions: Hyperglycemia - evaluation and treatment  DETERDING,ELIZABETH 03/12/2017, 11:59 PM

## 2017-03-12 NOTE — Progress Notes (Signed)
PULMONARY / CRITICAL CARE MEDICINE   Name: Charlotte [name] MRN: [id number] DOB: Dec 18, 1940    ADMISSION DATE:  03/04/2017 CONSULTATION DATE:  03/11/2017  REFERRING MD:  Windell Moulding  CHIEF COMPLAINT:  Leg pain  BRIEF:   76 y/o female with known Afib, polycythemia, and peripheral vascular disease and CHF on home O2 admitted on 8/1 with leg pain due to limb ischemia.  On 8/8 underwent R fem to tp trunk bypass, returned to the ICU intubated.     SUBJECTIVE:  On neo overnight Oliguric Relatively high O2 needs Worsening limb ischemia  VITAL SIGNS: BP 98/62   Pulse (!) 113   Temp 99.2 F (37.3 C) (Oral)   Resp 12   Ht 6' (1.829 m)   Wt 88.8 kg (195 lb 12.3 oz)   SpO2 95%   BMI 26.55 kg/m   HEMODYNAMICS:    VENTILATOR SETTINGS: Vent Mode: PRVC FiO2 (%):  [60 %-100 %] 60 % Set Rate:  [12 bmp] 12 bmp Vt Set:  [580 mL] 580 mL PEEP:  [5 cmH20] 5 cmH20 Plateau Pressure:  [16 cmH20-22 cmH20] 16 cmH20  INTAKE / OUTPUT: I/O last 3 completed shifts: In: 6096.4 [P.O.:480; I.V.:3786.4; Blood:630; IV Piggyback:1200] Out: 1450 [Urine:1200; Blood:250]  PHYSICAL EXAMINATION:  General:  In bed on vent HENT: NCAT ETT in place PULM: Crackles bases B, vent supported breathing CV: Tachy, irregular, no mgr GI: BS+, soft, nontender Derm: R foot cool, cyanotic, no palpable pulses Neuro: awake, on vent, eyes open but not following commands   LABS:  BMET  Recent Labs Lab 03/10/17 0336  03/11/17 0233  03/11/17 1610 03/11/17 1831 03/12/17 0433  NA 134*  --  135  < > 140 139 140  K 4.0  < > 3.7  < > 3.2* 3.1* 3.0*  CL 103  --  105  --   --   --  107  CO2 20*  --  18*  --   --   --  21*  BUN 46*  --  44*  --   --   --  36*  CREATININE 2.30*  --  1.99*  --   --   --  1.73*  GLUCOSE 90  --  85  --   --   --  100*  < > = values in this interval not displayed.  Electrolytes  Recent Labs Lab 03/10/17 0336 03/11/17 0233 03/12/17 0433  CALCIUM 8.2* 8.2* 8.1*  MG  --   --   2.1  PHOS  --   --  4.3    CBC  Recent Labs Lab 03/09/17 0254 03/10/17 0336 03/11/17 0233 03/11/17 1541 03/11/17 1610 03/11/17 1831  WBC 33.0* 25.6* 26.1*  --   --   --   HGB 9.7* 9.1* 8.6* 12.2 11.9* 11.2*  HCT 34.3* 32.1* 31.4* 36.0 35.0* 33.0*  PLT 2,065* 1,898* 1,690*  --   --   --     Coag's  Recent Labs Lab 03/10/17 0336 03/11/17 0233 03/12/17 0433  INR 1.67 2.10 2.30    Sepsis Markers  Recent Labs Lab 03/11/17 2115 03/11/17 2343 03/12/17 0433  LATICACIDVEN 0.9 0.9  --   PROCALCITON 0.50  --  0.74    ABG  Recent Labs Lab 03/11/17 1831 03/11/17 2100 03/12/17 0445  PHART 7.376 7.336* 7.351  PCO2ART 33.9 37.4 41.6  PO2ART 73.0* 93.9 64.0*    Liver Enzymes No results for input(s): AST, ALT, ALKPHOS, BILITOT, ALBUMIN in the last 168 hours.  Cardiac Enzymes No results for input(s): TROPONINI, PROBNP in the last 168 hours.  Glucose  Recent Labs Lab 03/09/17 1430  GLUCAP 96    Imaging Dg Abd 1 View  Result Date: 03/11/2017 CLINICAL DATA:  Encounter for nasogastric tube placement. EXAM: ABDOMEN - 1 VIEW COMPARISON:  None. FINDINGS: Nasal/orogastric tube passes below the diaphragm into the stomach. IMPRESSION: Well-positioned nasal/ orogastric tube. Electronically Signed   By: Lajean Manes M.D.   On: 03/11/2017 21:45   Dg Chest Port 1 View  Result Date: 03/11/2017 CLINICAL DATA:  Encounter for endotracheal tube placement. EXAM: PORTABLE CHEST 1 VIEW COMPARISON:  02/12/2017 FINDINGS: Endotracheal tube tip projects 5 cm above the carina. Nasogastric tube passes below the included field of view, not definitely below diaphragm, but likely into the stomach. All There is opacity in the lung bases obscuring hemidiaphragms extending to the perihilar regions. This is likely due to a combination of moderate pleural effusions with either atelectasis, pneumonia or a combination. No convincing pulmonary edema. Lung apices are clear. No pneumothorax. Skeletal  structures are demineralized but grossly intact. IMPRESSION: 1. Endotracheal tube tip projects 5 cm above carina. 2. Nasogastric tube incompletely imaged but likely extends to the stomach. 3. Moderate bilateral pleural effusions with associated atelectasis, pneumonia or a combination. No convincing pulmonary edema. Electronically Signed   By: Lajean Manes M.D.   On: 03/11/2017 20:41     STUDIES:  NM Myocar 8/4 > No decreased activity in the left ventricle on stress imaging to suggest reversible ischemia or infarction. Normal left ventricular wall motion. No left ventricular dilation. Left Ventricular Ejection Fraction: 69 % US renal 8/6 > No evidence of hydronephrosis, mildly increased renal parenchymal echogenicity may reflect medical renal disease CXR 8/8 >  Moderate bilateral pleural effusions with associated atelectasis, pneumonia or a combination. No convincing pulmonary edema  CULTURES: MRSA by PCR 8/3 > Negative  Staph A. 8/3 > Negative  Sputum 8/3 >>   ANTIBIOTICS: Cefuroxime 8/8 >>   SIGNIFICANT EVENTS: 8/1 > Presents to ED  8/8 > Underwent right lower extremity bypass   LINES/TUBES: ETT 8/8 >> Right Radial 8/8 >>   DISCUSSION: 76 y/o chronically ill lady with critical R leg ischemia due to peripheral vascular disease, now with post operative respiratory failure presumably due to acute pulmonary edema in the setting of known diastolic chf and poorly controlled heart rate from Afib.   ASSESSMENT / PLAN:  PULMONARY A: Acute respiratory failure Acute pulmonary edema Bilateral pleural effusions Baseline Home O2 requirement  P:   Full mechanical vent support VAP prevention Daily WUA/SBT Daily CXR Prn ABG   CARDIOVASCULAR A:  Afib with RVR Acute diastolic heart failure Shock, worsening, uncertain etiology; Afib/rate related? Sepsis?  I wonder if BP readings are spurious as renal function has been improving over last 2-3 days. P:  Tele Place central line now  to measure CVP, get Coox Check lactic acid Check ABG Check cortisol Continue amiodarone Continue heparin Check troponin Check hemoglobin Check procalcitonin  RENAL A:   Hypokalemia Acute on chronic renal failure P:   Monitor BMET and UOP Replace electrolytes as needed KVO fluids for now as appears volume up  GASTROINTESTINAL A:   No acute issues P:   Continue PPI for stress ulcer prophylaxis Start tube feedings  HEMATOLOGIC A:   Polycythemia vera History of DVT P:  Continue heparin gtt Check CBC now  INFECTIOUS A:   Ischemic R foot but no obvious infection 8/9 (WBC down, afebrile) P:  Continue cefuroxime for now Send procalcitonin  ENDOCRINE A:   Hypothyroidism   P:   Continue synthroid  NEUROLOGIC A:   Sedation needs for vent synchrony P:   RASS goal: -1 Fentanyl infusion per PAD protocol   FAMILY  - Updates: none bedside  - Inter-disciplinary family meet or Palliative Care meeting due by:  day 7  My cc time 40 minutes  Roselie Awkward, MD [facility] Pager: [phone] Cell: [phone] After 3pm or if no response, call [phone]  03/12/2017, 8:08 AM

## 2017-03-12 NOTE — Anesthesia Postprocedure Evaluation (Signed)
Anesthesia Post Note  Patient: LEIGHLA CHESTNUTT  Procedure(s) Performed: Procedure(s) (LRB): RIGHT FEMORAL-TIBIAL BYPASS IN-SITU GREATER SAPHENOUS VEIN (Right)     Patient location during evaluation: SICU Anesthesia Type: General Level of consciousness: sedated Pain management: pain level controlled Vital Signs Assessment: vitals unstable Respiratory status: patient on ventilator - see flowsheet for VS Cardiovascular status: unstable (on pressors ) Anesthetic complications: no    Last Vitals:  Vitals:   03/12/17 1415 03/12/17 1430  BP: (!) 85/57 (!) 89/60  Pulse: (!) 111 (!) 110  Resp: 11 13  Temp:    SpO2: 93% 93%    Last Pain:  Vitals:   03/12/17 1259  TempSrc: Oral  PainSc:                  Tonetta Napoles P Velmer Broadfoot

## 2017-03-12 NOTE — Progress Notes (Signed)
[facility]  Persistent/worsening shock with poorly controlled Afib Change to levophed Stop neo Add empiric antibiotics Stop po amiodarone, start IV  Roselie Awkward, MD [facility] Pager: [phone] Cell: [phone] After 3pm or if no response, call [phone]

## 2017-03-12 NOTE — Progress Notes (Signed)
CRITICAL VALUE ALERT  Critical Value:  Trop .16  Date & Time Notied: 03/12/17 1128  Provider Notified: Dr. Lake Bells  Orders Received/Actions taken: No new orders. Pt in Afib RVR.

## 2017-03-12 NOTE — Progress Notes (Signed)
eLink Physician-Brief Progress Note Patient Name: JALEIA HANKE DOB: Jun 02, 1941 MRN: [id number]   Date of Service  03/12/2017  HPI/Events of Note  Remains in AFIB with RVR - Ventricular rate = 130's to 140's. Already on an Amiodarone IV infusion. K+ = 4.7.  eICU Interventions  Will order: 1. Bolus with Amiodarone 150 mg IV over 10 minutes. Now.      Intervention Category Major Interventions: Arrhythmia - evaluation and management  Kailene Steinhart Eugene 03/12/2017, 10:52 PM

## 2017-03-12 NOTE — Progress Notes (Signed)
ANTICOAGULATION CONSULT NOTE - Follow Up Consult  Pharmacy Consult for Heparin Indication: Afib  No Known Allergies  Patient Measurements:  Heparin Dosing Weight: 79 kg  Vital Signs: Temp: 99.2 F (37.3 C) (08/09 0407) Temp Source: Oral (08/09 0407) BP: 98/62 (08/09 0700) Pulse Rate: 113 (08/09 0700)  Labs:  Recent Labs  03/10/17 0336 03/10/17 1518 03/11/17 0233 03/11/17 1541 03/11/17 1610 03/11/17 1831 03/12/17 0433  HGB 9.1*  --  8.6* 12.2 11.9* 11.2*  --   HCT 32.1*  --  31.4* 36.0 35.0* 33.0*  --   PLT 1,898*  --  1,690*  --   --   --   --   LABPROT 19.9*  --  23.9*  --   --   --  25.7*  INR 1.67  --  2.10  --   --   --  2.30  HEPARINUNFRC 0.23* 0.37 0.14*  --   --   --  <0.10*  CREATININE 2.30*  --  1.99*  --   --   --  1.73*    Estimated Creatinine Clearance: 35.8 mL/min (A) (by C-G formula based on SCr of 1.73 mg/dL (H)).   Assessment: 76 yo F presents with leg pain and known chronic occlusion. On heparin gtt for Afib. Now s/p OR for R lower extremity bypass with graft. As of this morning, the bypass appears patent in the thigh but no large vessel runoff to the foot per vascular surgery. Discussed patient's anticoagulation plan with vascular surgery. She will likely require further surgery. INR is therapeutic this AM at 2.3. Her last Coumadin dose was prior to admission. Currently on IV heparin at 500 units/hr. H/H 11.2/33, Plt 1690  Goal of Therapy:  Heparin level 0.3-0.7 units/ml Monitor platelets by anticoagulation protocol: Yes   Plan:  Stop heparin infusion since INR therapeutic. Discussed with vascular surgery  Hold off on resuming warfarin since she may likely need further surgery  Monitor daily PT/INR and s/s of bleeding  Albertina Parr, PharmD., BCPS Clinical Pharmacist Pager [phone]

## 2017-03-12 NOTE — Progress Notes (Signed)
CRITICAL VALUE ALERT  Critical Value:  Lactic Acid 3.0  Date & Time Notied:  03/12/17 1120  Provider Notified: Dr. Lake Bells  Orders Received/Actions taken: Repeat value in six hours.

## 2017-03-12 NOTE — Op Note (Addendum)
Patient name: Charlotte [name] MRN: [id number] DOB: 05-08-1941 Sex: female  03/11/2017  PRE-OPERATIVE DIAGNOSIS:  Peripheral Vascular Disease with Right Lower Extremity Rest Pain  I70.221  POST-OPERATIVE DIAGNOSIS:  Peripheral Vascular Disease with Right Lower Extremity Rest Pain    PROCEDURE:  1. US guided placement of right radial arterial monitoring line 2. Harvest of right greater saphenous vein 3. Redo exposure of right common femoral artery > 30 days 4. Right femoral to tp trunk bypass with in situ gsv  SURGEON: Waynetta Sandy, MD - Primary  Fields, Jessy Oto, MD - Assisting  PHYSICIAN ASSISTANT: Leontine Locket, PA  ANESTHESIA:   general  EBL:  250cc  BLOOD ADMINISTERED: 2 units CC PRBC   Indications:  76 year old female with a history of a femoral to above-knee popliteal artery bypass with graft several years ago. This was accompanied with common femoral endarterectomy and Dacron patch angioplasty. She has a known thrombosed graft. She is now admitted with right lower extremity rest pain and overnight prior to this procedure developed acute limb ischemia requiring surgery on an urgent basis. We have discussed the risks benefits and alternatives to bypass including the high likelihood of primary amputation at completion.  Findings: There are multiple varicose veins causing bleeding to the skin incisions below right lower extremity. The below-knee popliteal and TP trunk were soft and amenable for bypass. The greater saphenous vein was large throughout its course amenable for bypass. Common femoral artery had a very strong pulse in the graft proximally was used for the bypass. At completion there was a very strong signal in the bypass with flow through diastole demonstrating runoff through some vessels. There was only a monophasic signal at the posterior tibial artery at the level of the ankle with no strong signals distally.   Procedure:  The patient was  identified in the holding area and taken to the operating room where she was placed supine on the operating table general anesthesia was induced. She was first sterilely prepped in her right wrist. [place] was used to identify the radial artery. This was cannulated with micopuncture needle followed by wire.   A radial catheter was then placed and transduced expected waveform. It was affixed with silk suture.  She was sterilely prepped and draped in the right lower extremity given antibiotics and timeout called. Following this a longitudinal incision was made below the knee in which we encountered significant varicosities and bleeding was controlled. I then identified the greater saphenous vein distally protected this throughout its course and the incision divided branches between clips and ties and retracted this posteriorly. The fascia was then divided deep the soleus muscle taken down. We then identified the popliteal vein which there were multiple small veins around it was hard to dissected free from the popliteal artery. I then divided the deep fascia down to the TP trunk and this was then demonstrated by dividing multiple crossing veins. I did not place a vessel loop around this with the plan for using a tourniquet for the bypass. At the same time Dr. fields began with reopening the right femoral incision. He dissected down onto the common femoral artery with dacryon patch and then identified the previous bypass in the native SFA both of which were occluded. Given the significant scar tissue noted of these vessels were encircled with vessel loops but we dissected out the bypass for several centimeters. We then transected it where was noted to be occluded and with limited thrombectomy we had very  strong inflow. Via a series of 5 additional incisions we then exposed the greater saphenous vein leaving it in situ and clipping branches. There was one large branch above-the-knee which I clipped and plan to use for a  valvulotome insertion. At this time we then dissected out the greater saphenous vein through the common femoral exposure incision where there were multiple branches. These were all divided between ties down to the saphenofemoral junction. She had remained on her heparin drip throughout the case but an additional dose of heparin was given. The saphenous vein was then clamped at the junction and divided and clamped distally. I oversewed the saphenofemoral junction with 5-0 Prolene suture. The existing graft was already clamped for a previous femoropopliteal bypass. We again demonstrated brisk flow through this and trimmed back to its near origin and spatulated. The vein was then spatulated and sewn end and the previous PTFE graft with 5-0 Prolene suture. Upon releasing the clamp I then had brisk flow through the origin of the vein graft. The branch above-the-knee was then cut off and accessed with a valvulotome which was passed on 2 separate occasions until there was pulsatile antegrade bleeding down to that level. This was then oversewn. I then terminated attention to the very distal vein which was clamped distally and tied off and divided. Valvulotome was introduced through the most distal aspect of the vein and the valves lysed with 2 passes until we had antegrade bleeding. At this time we marked the vein for orientation. A tourniquet was brought to the above-knee area and placed. I confirmed that we were still good timing for heparin. Which she had maintained on a drip throughout the course of the case. An Esmarch was then used to exsanguinate the leg and tourniquet inflated to 250 mmHg. The leg was then straightened and vein trimmed to size and spatulated. TP trunk was opened longitudinally and noted to be soft and amenable for bypass. The vein was then sewn end to side with 6-0 Prolene suture. Prior to completing the anastomosis we did allow the tourniquet down we had strong antegrade bleeding we also had  backbleeding from our TP trunk. On completion the signal in the vein graft was multiphasic was flow through diastole. There was only a weak monophasic signal at the PT at the level of the ankle which did augment with compression of the vein graft. Satisfied with this we evaluated the muscle which was noted to be viable and reactive to electrocautery. Satisfied with this we irrigated all of our wounds obtain hemostasis. They were all then closed with Vicryl and staples excepting the groin incision which was closed at the level of the skin with Monocryl and Dermabond placed above that. Patient did tolerate the procedure well and will be transferred to ICU and remained intubated for her pulmonary status. All counts were correct at completion.     Cliffton Spradley C. [name], MD [facility] Office: [phone] Pager: [phone]

## 2017-03-12 NOTE — Progress Notes (Signed)
Called to see pt for absent doppler signals right foot.  No PT or DP or peroneal doppler Foot is cool and dusky Most likely bypass has occluded.   Flow was very diminished at conclusion of case in what was a longshot bypass Will not pursue thrombectomy as really no other options especially in light of the pts overall poor condition She will most likely need AKA Will inform Dr [name] later this morning  Ruta Hinds, MD [facility]: [phone] Pager: [phone]

## 2017-03-12 NOTE — Progress Notes (Signed)
PT Cancellation Note  Patient Details Name: Charlotte [name] MRN: [id number] DOB: 07/31/1941   Cancelled Treatment:    Reason Eval/Treat Not Completed: Patient not medically ready.  pt still intubated,sedated, and on pressors.  Will defer the eval today and see tomorrow as able. 03/12/2017  Donnella Sham, [place] [phone]  (pager)   Tessie Fass Isauro Skelley 03/12/2017, 9:44 AM

## 2017-03-12 NOTE — Progress Notes (Addendum)
eLink Physician-Brief Progress Note Patient Name: CYRENA KUCHENBECKER DOB: [birth year] MRN: [id number]   Date of Service  03/12/2017  HPI/Events of Note  AFIB with RVR - Ventricular rate = 120's to 130's. Mg++ = 2.0.  eICU Interventions  Will order:. 1. BMP STAT. 2. Bolus with Amiodarone 150 mg IV over 10 minutes now.       Intervention Category Major Interventions: Arrhythmia - evaluation and management  Delilah Mulgrew Eugene 03/12/2017, 7:10 PM

## 2017-03-12 NOTE — Progress Notes (Signed)
Progress Note  Patient Name: Charlotte [name] Date of Encounter: 03/12/2017  Primary Cardiologist: Dr Bronson Ing  Subjective   Intubated; opens eyes  Inpatient Medications    Scheduled Meds: . aspirin EC  81 mg Oral Daily  . chlorhexidine gluconate (MEDLINE KIT)  15 mL Mouth Rinse BID  . docusate sodium  100 mg Oral Daily  . fentaNYL (SUBLIMAZE) injection  50 mcg Intravenous Once  . gabapentin  100 mg Oral TID  . guaiFENesin  600 mg Oral BID  . hydroxyurea  1,000 mg Oral Q breakfast  . levothyroxine  75 mcg Per NG tube QAC breakfast  . mouth rinse  15 mL Mouth Rinse QID  . metoprolol tartrate  50 mg Per Tube BID  . pantoprazole (PROTONIX) IV  40 mg Intravenous Q24H  . polyethylene glycol  17 g Oral Daily  . pravastatin  10 mg Oral Daily  . senna-docusate  1 tablet Oral BID   Continuous Infusions: . cefUROXime (ZINACEF)  IV Stopped (03/11/17 2121)  . fentaNYL infusion INTRAVENOUS 200 mcg/hr (03/12/17 3009)  . heparin 500 Units/hr (03/11/17 2100)  . phenylephrine (NEO-SYNEPHRINE) Adult infusion 70 mcg/min (03/12/17 0647)  . potassium chloride Stopped (03/12/17 0648)   PRN Meds: acetaminophen **OR** acetaminophen, alum & mag hydroxide-simeth, fentaNYL, guaiFENesin-dextromethorphan, hydrALAZINE, labetalol, metoprolol tartrate, ondansetron **OR** ondansetron (ZOFRAN) IV, phenol   Vital Signs    Vitals:   03/12/17 0615 03/12/17 0630 03/12/17 0645 03/12/17 0700  BP:    98/62  Pulse: (!) 103 90 (!) 53 (!) 113  Resp: _0 Temp:      TempSrc:      SpO2: 96% 96% 97% 95%  Weight:      Height:        Intake/Output Summary (Last 24 hours) at 03/12/17 0731 Last data filed at 03/12/17 0647  Gross per 24 hour  Intake          4744.42 ml  Output             1175 ml  Net          3569.42 ml   Filed Weights   03/10/17 0630 03/11/17 0500 03/12/17 0500  Weight: 76.9 kg (169 lb 8.5 oz) 79 kg (174 lb 2.6 oz) 88.8 kg (195 lb 12.3 oz)    Telemetry    Atrial  fibrillation; rate elevated- Personally Reviewed   Physical Exam   GEN: WD, intubated Neck: No JVD, supple Cardiac: irregular, tachycardic Respiratory: Diminished BS bases GI: Soft, no masses MS: s/p RLE surgery, cool  Neuro:  moves all ext  Labs    Chemistry  Recent Labs Lab 03/10/17 0336 03/10/17 0808 03/11/17 0233 03/12/17 0433  NA 134*  --  135 140  K 4.0 4.4 3.7 3.0*  CL 103  --  105 107  CO2 20*  --  18* 21*  GLUCOSE 90  --  85 100*  BUN 46*  --  44* 36*  CREATININE 2.30*  --  1.99* 1.73*  CALCIUM 8.2*  --  8.2* 8.1*  GFRNONAA 20*  --  24* 28*  GFRAA 23*  --  27* 32*  ANIONGAP 11  --  12 12     Hematology  Recent Labs Lab 03/09/17 0254 03/10/17 0336 03/11/17 0233  WBC 33.0* 25.6* 26.1*  RBC 4.51 4.27 4.14  HGB 9.7* 9.1* 8.6*  HCT 34.3* 32.1* 31.4*  MCV 76.1* 75.2* 75.8*  MCH 21.5* 21.3* 20.8*  MCHC 28.3* 28.3* 27.4*  RDW 25.6* 25.4* 25.2*  PLT 2,065* 1,898* 1,690*    Radiology    Dg Abd 1 View  Result Date: 03/11/2017 CLINICAL DATA:  Encounter for nasogastric tube placement. EXAM: ABDOMEN - 1 VIEW COMPARISON:  None. FINDINGS: Nasal/orogastric tube passes below the diaphragm into the stomach. IMPRESSION: Well-positioned nasal/ orogastric tube. Electronically Signed   By: Lajean Manes M.D.   On: 03/11/2017 21:45   Dg Chest Port 1 View  Result Date: 03/11/2017 CLINICAL DATA:  Encounter for endotracheal tube placement. EXAM: PORTABLE CHEST 1 VIEW COMPARISON:  02/12/2017 FINDINGS: Endotracheal tube tip projects 5 cm above the carina. Nasogastric tube passes below the included field of view, not definitely below diaphragm, but likely into the stomach. All There is opacity in the lung bases obscuring hemidiaphragms extending to the perihilar regions. This is likely due to a combination of moderate pleural effusions with either atelectasis, pneumonia or a combination. No convincing pulmonary edema. Lung apices are clear. No pneumothorax. Skeletal structures  are demineralized but grossly intact. IMPRESSION: 1. Endotracheal tube tip projects 5 cm above carina. 2. Nasogastric tube incompletely imaged but likely extends to the stomach. 3. Moderate bilateral pleural effusions with associated atelectasis, pneumonia or a combination. No convincing pulmonary edema. Electronically Signed   By: Lajean Manes M.D.   On: 03/11/2017 20:41    Patient Profile     76 y.o. female Charlotte [name] a 76 y.o. femalewith past medical history of persistent atrial fibrillation (on Coumadin), chronic diastolic CHF, chronic respiratory failure (on 3L [place] at baseline),HTN, Stage 3 CKD, polycythemia vera, and PVD (s/p right femoral endarterectomy and femoral to above-knee popliteal bypass in 2014, known superficial femoral artery occlusion and occlusion of fem-pop bypass by imaging in 08/2016) who is being seen  for the evaluation of preoperative cardiac clearanceat the request of Dr. Trula Slade. Pt needs right common femoral to tibioperoneal trunk/peroneal bypass. Nuclear study shows no ischemia or infarction. Pt had surgery 8/8.  Assessment & Plan    1 permanent atrial fibrillation-HR elevated postoperatively and she is hypotensive requiring phenylephrine to maintain blood pressure. I will hold metoprolol and Cardizem for now. I will add amiodarone 400 mg twice a day for rate control. Given COPD I would not plan to continue this long-term. We will resume preadmission AV nodal blocking agents when blood pressure allows. Continue heparin.  2 Vascular disease-management per vascular surgery. Status post revascularization but right foot is cool.  3 chronic diastolic congestive heart failure-patient is volume overloaded. Would gently diurese as tolerated. We'll begin Lasix 40 mg IV daily.   4 acute on chronic stage III kidney disease-follow renal function closely with diuresis.  5 VDRF-management per CCM.  6 hypokalemia-supplement   Signed, Kirk Ruths, MD  03/12/2017,  7:31 AM

## 2017-03-12 NOTE — Progress Notes (Signed)
Initial Nutrition Assessment  INTERVENTION:   D/C Vital High Protein   Initiate Vital AF 1.2 @ 40 ml/hr (960 ml/day) Increase Prostat to 30 ml TID Provides: 1452 kcal, 117 grams protein, and 778 ml free water.    NUTRITION DIAGNOSIS:   Increased nutrient needs related to acute illness as evidenced by estimated needs.  GOAL:   Patient will meet greater than or equal to 90% of their needs  MONITOR:   TF tolerance, Vent status, I & O's  REASON FOR ASSESSMENT:   Consult, Ventilator Enteral/tube feeding initiation and management  ASSESSMENT:   Pt with PMH of Afib, PVD, CHF on home O2 admitted with R limb ischemia on 8/1, s/p fem to tp trunk bypass 8/8.    Pt remains intubated after surgery 8/8 for acute respiratory failure, acute pulmonary edema and bilateral pleural effusions.  She remains at high risk for AKA.  Per CCM pt with persistent/worsening shock changing to norepinephrine stopping phenylephrine  MV: 6.9 L/min Temp (24hrs), Avg:98.4 F (36.9 C), Min:97.5 F (36.4 C), Max:99.2 F (37.3 C) MAP: [phone] Medications reviewed and include: colace, lasix, synthroid, miralax, senokot-s, 10 mEq KCl, phenylephrine, norepinephrine Labs reviewed: K+ 3.0 (L)  Nutrition-Focused physical exam completed. Findings are no fat depletion, mild/moderate muscle depletion of temples, and moderate edema.   Meal Completion: 25-50% during admission TF: Vital High Protein @ 40 ml/hr with 30 ml Prostat BID (1160 kcal and 114 grams protein)  Diet Order:  Diet NPO time specified  Skin:   (R groin and leg incisions)  Last BM:  8/7 large  Height:   Ht Readings from Last 1 Encounters:  03/05/17 6' (1.829 m)    Weight:   Wt Readings from Last 1 Encounters:  03/12/17 195 lb 12.3 oz (88.8 kg)  Admission weight: 154 lb (69.9 kg)  Ideal Body Weight:  72.7 kg  BMI:  Body mass index is 26.55 kg/m.  Estimated Nutritional Needs:   Kcal:  1494  Protein:  105-115  grams  Fluid:  > 1.5 L/day  EDUCATION NEEDS:   No education needs identified at this time  [place], [place], [place] Pager [phone] After Hours Pager

## 2017-03-13 ENCOUNTER — Inpatient Hospital Stay (HOSPITAL_COMMUNITY): Payer: Medicare Other

## 2017-03-13 ENCOUNTER — Encounter (HOSPITAL_COMMUNITY): Payer: Medicare Other

## 2017-03-13 DIAGNOSIS — I361 Nonrheumatic tricuspid (valve) insufficiency: Secondary | ICD-10-CM

## 2017-03-13 DIAGNOSIS — I340 Nonrheumatic mitral (valve) insufficiency: Secondary | ICD-10-CM

## 2017-03-13 LAB — BLOOD GAS, ARTERIAL
Acid-base deficit: 10.3 mmol/L — ABNORMAL HIGH (ref 0.0–2.0)
Acid-base deficit: 10.2 mmol/L — ABNORMAL HIGH (ref 0.0–2.0)
Bicarbonate: 14.3 mmol/L — ABNORMAL LOW (ref 20.0–28.0)
Bicarbonate: 14.4 mmol/L — ABNORMAL LOW (ref 20.0–28.0)
FIO2: 70
FIO2: 60
LHR: 30 {breaths}/min
MECHVT: 600 mL
MECHVT: 600 mL
O2 Saturation: 98.9 %
O2 Saturation: 89.4 %
PATIENT TEMPERATURE: 99
PATIENT TEMPERATURE: 98.6
PCO2 ART: 27 mmHg — AB (ref 32.0–48.0)
PEEP/CPAP: 10 cmH2O
PEEP: 10 cmH2O
PO2 ART: 125 mmHg — AB (ref 83.0–108.0)
PO2 ART: 58.2 mmHg — AB (ref 83.0–108.0)
RATE: 30 resp/min
pCO2 arterial: 27.4 mmHg — ABNORMAL LOW (ref 32.0–48.0)
pH, Arterial: 7.344 — ABNORMAL LOW (ref 7.350–7.450)
pH, Arterial: 7.341 — ABNORMAL LOW (ref 7.350–7.450)

## 2017-03-13 LAB — GLUCOSE, CAPILLARY
GLUCOSE-CAPILLARY: 144 mg/dL — AB (ref 65–99)
GLUCOSE-CAPILLARY: 124 mg/dL — AB (ref 65–99)
GLUCOSE-CAPILLARY: 117 mg/dL — AB (ref 65–99)
GLUCOSE-CAPILLARY: 125 mg/dL — AB (ref 65–99)
Glucose-Capillary: 121 mg/dL — ABNORMAL HIGH (ref 65–99)
Glucose-Capillary: 194 mg/dL — ABNORMAL HIGH (ref 65–99)

## 2017-03-13 LAB — CBC WITH DIFFERENTIAL/PLATELET
Basophils Absolute: 0 10*3/uL (ref 0.0–0.1)
Basophils Relative: 0 %
EOS ABS: 0 10*3/uL (ref 0.0–0.7)
Eosinophils Relative: 0 %
HCT: 38.6 % (ref 36.0–46.0)
Hemoglobin: 11.1 g/dL — ABNORMAL LOW (ref 12.0–15.0)
LYMPHS PCT: 3 %
Lymphs Abs: 0.7 10*3/uL (ref 0.7–4.0)
MCH: 23 pg — ABNORMAL LOW (ref 26.0–34.0)
MCHC: 28.8 g/dL — ABNORMAL LOW (ref 30.0–36.0)
MCV: 80.1 fL (ref 78.0–100.0)
MONO ABS: 1.5 10*3/uL — AB (ref 0.1–1.0)
Monocytes Relative: 7 %
NEUTROS PCT: 90 %
Neutro Abs: 19.9 10*3/uL — ABNORMAL HIGH (ref 1.7–7.7)
PLATELETS: 1631 10*3/uL — AB (ref 150–400)
RBC: 4.82 MIL/uL (ref 3.87–5.11)
RDW: 25.3 % — AB (ref 11.5–15.5)
WBC: 22.1 10*3/uL — AB (ref 4.0–10.5)

## 2017-03-13 LAB — HEPATIC FUNCTION PANEL
ALK PHOS: 83 U/L (ref 38–126)
ALT: 146 U/L — AB (ref 14–54)
AST: 259 U/L — ABNORMAL HIGH (ref 15–41)
Albumin: 2.3 g/dL — ABNORMAL LOW (ref 3.5–5.0)
BILIRUBIN DIRECT: 0.8 mg/dL — AB (ref 0.1–0.5)
BILIRUBIN INDIRECT: 0.8 mg/dL (ref 0.3–0.9)
BILIRUBIN TOTAL: 1.6 mg/dL — AB (ref 0.3–1.2)
Total Protein: 4.7 g/dL — ABNORMAL LOW (ref 6.5–8.1)

## 2017-03-13 LAB — POCT I-STAT 3, ART BLOOD GAS (G3+)
ACID-BASE DEFICIT: 14 mmol/L — AB (ref 0.0–2.0)
ACID-BASE DEFICIT: 8 mmol/L — AB (ref 0.0–2.0)
BICARBONATE: 15.4 mmol/L — AB (ref 20.0–28.0)
Bicarbonate: 14.6 mmol/L — ABNORMAL LOW (ref 20.0–28.0)
O2 Saturation: 87 %
O2 Saturation: 94 %
PH ART: 7.396 (ref 7.350–7.450)
PO2 ART: 71 mmHg — AB (ref 83.0–108.0)
TCO2: 17 mmol/L (ref 0–100)
TCO2: 15 mmol/L (ref 0–100)
pCO2 arterial: 48.7 mmHg — ABNORMAL HIGH (ref 32.0–48.0)
pCO2 arterial: 23.7 mmHg — ABNORMAL LOW (ref 32.0–48.0)
pH, Arterial: 7.11 — CL (ref 7.350–7.450)
pO2, Arterial: 68 mmHg — ABNORMAL LOW (ref 83.0–108.0)

## 2017-03-13 LAB — BASIC METABOLIC PANEL
ANION GAP: 12 (ref 5–15)
Anion gap: 10 (ref 5–15)
BUN: 44 mg/dL — ABNORMAL HIGH (ref 6–20)
BUN: 49 mg/dL — ABNORMAL HIGH (ref 6–20)
CALCIUM: 7.6 mg/dL — AB (ref 8.9–10.3)
CO2: 16 mmol/L — ABNORMAL LOW (ref 22–32)
CO2: 14 mmol/L — AB (ref 22–32)
Calcium: 7.8 mg/dL — ABNORMAL LOW (ref 8.9–10.3)
Chloride: 108 mmol/L (ref 101–111)
Chloride: 110 mmol/L (ref 101–111)
Creatinine, Ser: 2.5 mg/dL — ABNORMAL HIGH (ref 0.44–1.00)
Creatinine, Ser: 2.51 mg/dL — ABNORMAL HIGH (ref 0.44–1.00)
GFR calc Af Amer: 21 mL/min — ABNORMAL LOW (ref >60)
GFR calc non Af Amer: 18 mL/min — ABNORMAL LOW (ref >60)
GFR, EST AFRICAN AMERICAN: 21 mL/min — AB (ref >60)
GFR, EST NON AFRICAN AMERICAN: 18 mL/min — AB (ref >60)
Glucose, Bld: 156 mg/dL — ABNORMAL HIGH (ref 65–99)
Glucose, Bld: 134 mg/dL — ABNORMAL HIGH (ref 65–99)
Potassium: 4.4 mmol/L (ref 3.5–5.1)
Potassium: 3.8 mmol/L (ref 3.5–5.1)
SODIUM: 136 mmol/L (ref 135–145)
Sodium: 134 mmol/L — ABNORMAL LOW (ref 135–145)

## 2017-03-13 LAB — PROTIME-INR
INR: 5.97 — AB
PROTHROMBIN TIME: 55.1 s — AB (ref 11.4–15.2)

## 2017-03-13 LAB — PHOSPHORUS: Phosphorus: 5.4 mg/dL — ABNORMAL HIGH (ref 2.5–4.6)

## 2017-03-13 LAB — ECHOCARDIOGRAM COMPLETE
HEIGHTINCHES: 72 in
Weight: 3192.26 oz

## 2017-03-13 LAB — PROCALCITONIN: Procalcitonin: 5.08 ng/mL

## 2017-03-13 LAB — PATHOLOGIST SMEAR REVIEW

## 2017-03-13 LAB — MAGNESIUM: MAGNESIUM: 2 mg/dL (ref 1.7–2.4)

## 2017-03-13 MED ORDER — SODIUM BICARBONATE 8.4 % IV SOLN
100.0000 meq | Freq: Once | INTRAVENOUS | Status: AC
  Administered 2017-03-13: 100 meq via INTRAVENOUS
  Filled 2017-03-13: qty 100

## 2017-03-13 MED ORDER — FUROSEMIDE 10 MG/ML IJ SOLN
40.0000 mg | Freq: Two times a day (BID) | INTRAMUSCULAR | Status: DC
  Filled 2017-03-13: qty 4

## 2017-03-13 MED ORDER — FUROSEMIDE 10 MG/ML IJ SOLN
80.0000 mg | Freq: Three times a day (TID) | INTRAMUSCULAR | Status: DC
  Administered 2017-03-13: 80 mg via INTRAVENOUS
  Filled 2017-03-13: qty 8

## 2017-03-13 MED ORDER — MIDAZOLAM HCL 2 MG/2ML IJ SOLN
1.0000 mg | INTRAMUSCULAR | Status: DC | PRN
  Administered 2017-03-13 – 2017-03-15 (×6): 2 mg via INTRAVENOUS
  Filled 2017-03-13 (×5): qty 2

## 2017-03-13 MED ORDER — DEXTROSE 5 % IV SOLN
0.0000 ug/min | INTRAVENOUS | Status: DC
  Administered 2017-03-13: 35 ug/min via INTRAVENOUS
  Administered 2017-03-13: 22 ug/min via INTRAVENOUS
  Administered 2017-03-14: 20 ug/min via INTRAVENOUS
  Administered 2017-03-14: 22 ug/min via INTRAVENOUS
  Administered 2017-03-15: 7 ug/min via INTRAVENOUS
  Administered 2017-03-17: 8.96 ug/min via INTRAVENOUS
  Administered 2017-03-18: 9 ug/min via INTRAVENOUS
  Filled 2017-03-13 (×8): qty 16

## 2017-03-13 MED ORDER — FUROSEMIDE 10 MG/ML IJ SOLN
120.0000 mg | Freq: Three times a day (TID) | INTRAMUSCULAR | Status: DC
  Administered 2017-03-13 – 2017-03-21 (×24): 120 mg via INTRAVENOUS
  Filled 2017-03-13: qty 10
  Filled 2017-03-13 (×2): qty 12
  Filled 2017-03-13 (×2): qty 10
  Filled 2017-03-13: qty 12
  Filled 2017-03-13: qty 10
  Filled 2017-03-13 (×5): qty 12
  Filled 2017-03-13: qty 4
  Filled 2017-03-13 (×3): qty 10
  Filled 2017-03-13 (×4): qty 12
  Filled 2017-03-13: qty 10
  Filled 2017-03-13 (×4): qty 12
  Filled 2017-03-13 (×2): qty 10

## 2017-03-13 MED ORDER — MIDAZOLAM HCL 2 MG/2ML IJ SOLN
INTRAMUSCULAR | Status: AC
  Administered 2017-03-13: 2 mg via INTRAVENOUS
  Filled 2017-03-13: qty 2

## 2017-03-13 MED ORDER — VANCOMYCIN HCL IN DEXTROSE 1-5 GM/200ML-% IV SOLN
1000.0000 mg | INTRAVENOUS | Status: DC
  Administered 2017-03-13: 1000 mg via INTRAVENOUS
  Filled 2017-03-13 (×2): qty 200

## 2017-03-13 NOTE — Progress Notes (Signed)
RT advanced pt's ETT to 23cm at the lip per MD order. Patient had good return volumes.

## 2017-03-13 NOTE — Progress Notes (Signed)
RT NOTE:  Critical ISTAT ABG results reported to Mick Sell, RN @ [phone].   03/13/2017 05:15  Sample type ARTERIAL  pH, Arterial 7.110 (LL)  pCO2 arterial 48.7 (H)  pO2, Arterial 71.0 (L)  TCO2 17  Acid-base deficit 14.0 (H)  Bicarbonate 15.4 (L)  O2 Saturation 87.0  Patient temperature 99.0 F  Collection site ARTERIAL LINE   RN will report to [facility]. RT will make vent adjustments as ordered.

## 2017-03-13 NOTE — Progress Notes (Signed)
  Echocardiogram 2D Echocardiogram has been performed.  Darlina Sicilian M 03/13/2017, 1:51 PM

## 2017-03-13 NOTE — Progress Notes (Signed)
    Recent Labs Lab 03/11/17 1541 03/11/17 1610 03/11/17 1831 03/11/17 2100 03/12/17 0445 03/13/17 0515 03/13/17 0655 03/13/17 0955  PHART 7.197* 7.365 7.376 7.336* 7.351 7.110* 7.344* 7.341*  PCO2ART 40.6 38.5 33.9 37.4 41.6 48.7* 27.0* 27.4*  PO2ART 56.0* 70.0* 73.0* 93.9 64.0* 71.0* 125* 58.2*  HCO3 15.8* 22.0 19.8* 19.5* 23.0 15.4* 14.3* 14.4*  TCO2 17 23 21   --  24 17  --   --   O2SAT 82.0 93.0 94.0 96.9 91.0 87.0 98.9 89.4    Mild acidodsis +  Plan Repeat ABG and bmet at 3pm and based on this consider renal consult +/- bic gtt   Dr. Brand Males, M.D., [facility].C.P Pulmonary and Critical Care Medicine Staff Physician [place] [facility] Pager: [phone], If no answer or between  15:00h - 7:00h: call [phone]  03/13/2017 11:06 AM

## 2017-03-13 NOTE — Progress Notes (Signed)
OT Cancellation Note  Patient Details Name: Charlotte [name] MRN: [id number] DOB: 02/23/1941   Cancelled Treatment:    Reason Eval/Treat Not Completed: Medical issues which prohibited therapy.  Will check back next week for appropriateness.   [place], OTR/L [phone]   [name] 03/13/2017, 11:55 AM

## 2017-03-13 NOTE — Progress Notes (Signed)
eLink Physician-Brief Progress Note Patient Name: Charlotte [name] DOB: 1940/09/05 MRN: [id number]   Date of Service  03/13/2017  HPI/Events of Note  Oliguria - CVP = 16 and Creatinine = 2.51. No real response to Lasix 80 mg IV Q 8 hours.   eICU Interventions  Will increase Lasix dose to 120 mg IV Q 8 hours.      Intervention Category Intermediate Interventions: Oliguria - evaluation and management  [place] Tome [place] 03/13/2017, 5:19 PM

## 2017-03-13 NOTE — Progress Notes (Addendum)
Progress Note  SUBJECTIVE:     Awake on vent. Not following commands.   OBJECTIVE:   Vitals:   03/13/17 0630 03/13/17 0645  BP: 96/70 96/70  Pulse: (!) 101 84  Resp: (!) 30 (!) 30  Temp:    SpO2: 96% 96%    Intake/Output Summary (Last 24 hours) at 03/13/17 0759 Last data filed at 03/13/17 0600  Gross per 24 hour  Intake          4891.38 ml  Output               90 ml  Net          4801.38 ml   Right distal foot cool and mottled. Monophasic right PT signal.  Right leg dressings with minimal drainage.  Not following commands.  ASSESSMENT/PLAN:   76 y.o. female is s/p: right femoral to TPT bypass with in situ gsv 2 Days Post-Op   Remains intubated this am. Not following commands to assess neuro function. Right foot is cool, but there is a monophasic right PT signal.  Family understands she is at high risk of AKA.   Charlotte [name] 03/13/2017 7:59 AM -- LABS:   CBC    Component Value Date/Time   WBC 22.1 (H) 03/13/2017 0308   HGB 11.1 (L) 03/13/2017 0308   HCT 38.6 03/13/2017 0308   PLT 1,631 (HH) 03/13/2017 0308    BMET    Component Value Date/Time   NA 134 (L) 03/13/2017 0308   K 4.4 03/13/2017 0308   CL 108 03/13/2017 0308   CO2 16 (L) 03/13/2017 0308   GLUCOSE 156 (H) 03/13/2017 0308   BUN 44 (H) 03/13/2017 0308   CREATININE 2.50 (H) 03/13/2017 0308   CREATININE 1.21 (H) 08/14/2016 0733   CALCIUM 7.8 (L) 03/13/2017 0308   GFRNONAA 18 (L) 03/13/2017 0308   GFRAA 21 (L) 03/13/2017 0308    COAG Lab Results  Component Value Date   INR 5.97 (HH) 03/13/2017   INR 2.30 03/12/2017   INR 2.10 03/11/2017   No results found for: PTT  ANTIBIOTICS:   Anti-infectives    Start     Dose/Rate Route Frequency Ordered Stop   03/13/17 1100  vancomycin (VANCOCIN) 1,250 mg in sodium chloride 0.9 % 250 mL IVPB  Status:  Discontinued     1,250 mg 166.7 mL/hr over 90 Minutes Intravenous Every 24 hours 03/12/17 0937 03/13/17 0741   03/13/17 1100  vancomycin  (VANCOCIN) IVPB 1000 mg/200 mL premix     1,000 mg 200 mL/hr over 60 Minutes Intravenous Every 24 hours 03/13/17 0741     03/12/17 1000  ceFEPIme (MAXIPIME) 2 g in dextrose 5 % 50 mL IVPB     2 g 100 mL/hr over 30 Minutes Intravenous Every 24 hours 03/12/17 0931     03/12/17 1000  vancomycin (VANCOCIN) 1,750 mg in sodium chloride 0.9 % 500 mL IVPB     1,750 mg 250 mL/hr over 120 Minutes Intravenous  Once 03/12/17 0931 03/12/17 1210   03/11/17 2000  cefUROXime (ZINACEF) 1.5 g in dextrose 5 % 50 mL IVPB     1.5 g 100 mL/hr over 30 Minutes Intravenous Every 12 hours 03/11/17 1955 03/12/17 0930   03/11/17 1245  cefUROXime (ZINACEF) 1.5 g in dextrose 5 % 50 mL IVPB     1.5 g 100 mL/hr over 30 Minutes Intravenous On call to O.R. 03/11/17 1244 03/11/17 [place], PA-C [facility] Office: [phone] Pager: [phone] 03/13/2017 7:59 AM   I have independently interviewed and examined patient and agree with PA assessment and plan above. Bypass is patent but difficult to assess neuro status of right leg. Cardiopulmonary are greatest issues at this time being managed by pccm.   Kolten Ryback C. [name], MD [facility] [place] Office: [phone] Pager: [phone]

## 2017-03-13 NOTE — Progress Notes (Addendum)
PULMONARY / CRITICAL CARE MEDICINE   Name: Charlotte [name] MRN: [id number] DOB: November 04, 1940    ADMISSION DATE:  03/04/2017 CONSULTATION DATE:  03/11/2017  REFERRING MD:  Windell Moulding  CHIEF COMPLAINT:  Leg pain  BRIEF:   76 y/o female with known Afib, polycythemia, and peripheral vascular disease and CHF on home O2 admitted on 8/1 with leg pain due to limb ischemia.  On 8/8 underwent R fem to tp trunk bypass, returned to the ICU intubated.     STUDIES:  NM Myocar 8/4 > No decreased activity in the left ventricle on stress imaging to suggest reversible ischemia or infarction. Normal left ventricular wall motion. No left ventricular dilation. Left Ventricular Ejection Fraction: 69 % US renal 8/6 > No evidence of hydronephrosis, mildly increased renal parenchymal echogenicity may reflect medical renal disease CXR 8/8 >  Moderate bilateral pleural effusions with associated atelectasis, pneumonia or a combination. No convincing pulmonary edema  CULTURES: MRSA by PCR 8/3 > Negative  Staph A. 8/3 > Negative  Sputum 8/3 >>   ANTIBIOTICS: Cefuroxime 8/8 >>  LINES/TUBES: ETT 8/8 >> Right Radial 8/8 >>    SIGNIFICANT EVENTS: 8/1 > Presents to ED  8/8 > Underwent right lower extremity bypass  8/9 - On neo overnight Oliguric Relatively high O2 needs Worsening limb ischemia    SUBJECTIVE/OVERNIGHT/INTERVAL HX 03/13/17 - worsening AKI creat 2.5. Severe thromboctyosis improving. Leukocytosis improving. PCT high 5s. Per RN- still on levophed 37mcg but improving , nearly anuric, probably acidotic. On amio gtt. On fent gtt but needed fent and versed prn for calm . Rt foot still cold with toes purple. R DP - not dopplerable. On 60% fio2/peep 10 with effusion on cxr  VITAL SIGNS: BP 92/66 (BP Location: Left Arm)   Pulse (!) 111   Temp 98.7 F (37.1 C) (Axillary)   Resp (!) 32   Ht 6' (1.829 m)   Wt 90.5 kg (199 lb 8.3 oz)   SpO2 95%   BMI 27.06 kg/m   HEMODYNAMICS: CVP:  [18  mmHg-22 mmHg] 22 mmHg  VENTILATOR SETTINGS: Vent Mode: PRVC FiO2 (%):  [50 %-70 %] 60 % Set Rate:  [12 bmp-30 bmp] 30 bmp Vt Set:  [580 mL-600 mL] 600 mL PEEP:  [5 cmH20-10 cmH20] 10 cmH20 Plateau Pressure:  [14 cmH20-25 cmH20] 25 cmH20  INTAKE / OUTPUT: I/O last 3 completed shifts: In: 5420.8 [I.V.:3220.8; NG/GT:1050; IV Piggyback:1150] Out: 320 [Urine:320]  PHYSICAL EXAMINATION:  General Appearance:    Looks criticall ill   Head:    Normocephalic, without obvious abnormality, atraumatic  Eyes:    PERRL - yes, conjunctiva/corneas - clear      Ears:    Normal external ear canals, both ears  Nose:   NG tube - no  Throat:  ETT TUBE - yes , OG tube - yes  Neck:   Supple,  No enlargement/tenderness/nodules     Lungs:     Clear to auscultation bilaterally, Ventilator   Synchrony - yes 60% fio2/peep 10  Chest wall:    No deformity  Heart:    S1 and S2 normal, no murmur, CVP 16.  Pressors - levophed 14mcg  Abdomen:     Soft, no masses, no organomegaly  Genitalia:    Not done  Rectal:   not done  Extremities:   Extremities- Rt foot cold with DP on right without dopplerable pulse     Skin:   Intact in exposed areas . Rt foot skin blue  Neurologic:   Sedation -fent gtt with fent prn and versed prn-> RASS - -3 but off +2       LABS:   PULMONARY  Recent Labs Lab 03/11/17 1541 03/11/17 1610 03/11/17 1831 03/11/17 2100 03/12/17 0445 03/13/17 0515 03/13/17 0655  PHART 7.197* 7.365 7.376 7.336* 7.351 7.110* 7.344*  PCO2ART 40.6 38.5 33.9 37.4 41.6 48.7* 27.0*  PO2ART 56.0* 70.0* 73.0* 93.9 64.0* 71.0* 125*  HCO3 15.8* 22.0 19.8* 19.5* 23.0 15.4* 14.3*  TCO2 17 23 21   --  24 17  --   O2SAT 82.0 93.0 94.0 96.9 91.0 87.0 98.9    CBC  Recent Labs Lab 03/11/17 0233  03/11/17 1831 03/12/17 1030 03/13/17 0308  HGB 8.6*  < > 11.2* 10.4* 11.1*  HCT 31.4*  < > 33.0* 35.1* 38.6  WBC 26.1*  --   --  23.4* 22.1*  PLT 1,690*  --   --  1,767* 1,631*  < > = values in  this interval not displayed.  COAGULATION  Recent Labs Lab 03/09/17 0254 03/10/17 0336 03/11/17 0233 03/12/17 0433 03/13/17 0308  INR 1.50 1.67 2.10 2.30 5.97*    CARDIAC   Recent Labs Lab 03/12/17 1030  TROPONINI 0.16*   No results for input(s): PROBNP in the last 168 hours.   CHEMISTRY  Recent Labs Lab 03/10/17 0336  03/11/17 0233  03/11/17 1610 03/11/17 1831 03/12/17 0433 03/12/17 1030 03/12/17 1553 03/12/17 2021 03/13/17 0308  NA 134*  --  135  < > 140 139 140  --   --  136 134*  K 4.0  < > 3.7  < > 3.2* 3.1* 3.0*  --   --  4.7 4.4  CL 103  --  105  --   --   --  107  --   --  107 108  CO2 20*  --  18*  --   --   --  21*  --   --  19* 16*  GLUCOSE 90  --  85  --   --   --  100*  --   --  166* 156*  BUN 46*  --  44*  --   --   --  36*  --   --  42* 44*  CREATININE 2.30*  --  1.99*  --   --   --  1.73*  --   --  2.31* 2.50*  CALCIUM 8.2*  --  8.2*  --   --   --  8.1*  --   --  8.3* 7.8*  MG  --   --   --   --   --   --  2.1 2.2 2.0  --  2.0  PHOS  --   --   --   --   --   --  4.3 5.3* 5.0*  --  5.4*  < > = values in this interval not displayed. Estimated Creatinine Clearance: 25 mL/min (A) (by C-G formula based on SCr of 2.5 mg/dL (H)).   LIVER  Recent Labs Lab 03/09/17 0254 03/10/17 0336 03/11/17 0233 03/12/17 0433 03/13/17 0308 03/13/17 0501  AST  --   --   --   --   --  259*  ALT  --   --   --   --   --  146*  ALKPHOS  --   --   --   --   --  36  BILITOT  --   --   --   --   --  1.6*  PROT  --   --   --   --   --  4.7*  ALBUMIN  --   --   --   --   --  2.3*  INR 1.50 1.67 2.10 2.30 5.97*  --      INFECTIOUS  Recent Labs Lab 03/11/17 2343 03/12/17 0433 03/12/17 1030 03/12/17 1553 03/13/17 0308  LATICACIDVEN 0.9  --  3.0* 1.5  --   PROCALCITON  --  0.74 0.75  --  5.08     ENDOCRINE CBG (last 3)   Recent Labs  03/12/17 1613 03/12/17 2353 03/13/17 0827  GLUCAP 137* 194* 121*         IMAGING x48h  - image(s)  personally visualized  -   highlighted in bold Dg Abd 1 View  Result Date: 03/11/2017 CLINICAL DATA:  Encounter for nasogastric tube placement. EXAM: ABDOMEN - 1 VIEW COMPARISON:  None. FINDINGS: Nasal/orogastric tube passes below the diaphragm into the stomach. IMPRESSION: Well-positioned nasal/ orogastric tube. Electronically Signed   By: Lajean Manes M.D.   On: 03/11/2017 21:45   Dg Chest Port 1 View  Result Date: 03/12/2017 CLINICAL DATA:  Central line placement. EXAM: PORTABLE CHEST 1 VIEW COMPARISON:  Chest x-ray from earlier today. FINDINGS: Interval placement of a left subclavian central venous catheter with tip at the cavoatrial junction. Endotracheal and enteric tubes are in unchanged position. Stable cardiomegaly. Persistent bibasilar hazy opacities reflecting moderate effusions and atelectasis. No pneumothorax. IMPRESSION: 1. Interval insertion of a left subclavian central venous catheter with tip in good position projecting over the cavoatrial junction. No pneumothorax. 2. Persistent bibasilar hazy opacities, likely combination of effusion and atelectasis. Electronically Signed   By: Titus Dubin M.D.   On: 03/12/2017 09:47   Dg Chest Port 1 View  Result Date: 03/12/2017 CLINICAL DATA:  Endotracheal tube placement, respiratory failure. EXAM: PORTABLE CHEST 1 VIEW COMPARISON:  03/11/2017 FINDINGS: Endotracheal tube with tip 6.1 cm above the carina. Nasogastric tube courses into the region of the stomach and off the inferior portion of the film. Exam demonstrates no change to slight worsening of hazy opacification over the mid to lower lungs likely moderate bilateral pleural effusions with associated bibasilar atelectasis. Right effusion appears slightly improved no left effusion slightly worse. Likely mild associated interstitial edema. Stable cardiomegaly. Remainder of the exam is unchanged. IMPRESSION: Persistent hazy opacification over the mid to lower lungs likely bilateral effusions with  bibasilar atelectasis. Right effusion slightly improved on left effusion slightly worse. Mild interstitial edema.  Stable cardiomegaly. Tubes and lines as described. Electronically Signed   By: Marin Olp M.D.   On: 03/12/2017 08:14   Dg Chest Port 1 View  Result Date: 03/11/2017 CLINICAL DATA:  Encounter for endotracheal tube placement. EXAM: PORTABLE CHEST 1 VIEW COMPARISON:  02/12/2017 FINDINGS: Endotracheal tube tip projects 5 cm above the carina. Nasogastric tube passes below the included field of view, not definitely below diaphragm, but likely into the stomach. All There is opacity in the lung bases obscuring hemidiaphragms extending to the perihilar regions. This is likely due to a combination of moderate pleural effusions with either atelectasis, pneumonia or a combination. No convincing pulmonary edema. Lung apices are clear. No pneumothorax. Skeletal structures are demineralized but grossly intact. IMPRESSION: 1. Endotracheal tube tip projects 5 cm above carina. 2. Nasogastric tube incompletely imaged but likely extends to the stomach. 3. Moderate bilateral pleural effusions with associated atelectasis, pneumonia or a combination. No convincing pulmonary edema. Electronically Signed  By: Lajean Manes M.D.   On: 03/11/2017 20:41      DISCUSSION: 76 y/o chronically ill lady with critical R leg ischemia due to peripheral vascular disease, now with post operative respiratory failure presumably due to acute pulmonary edema in the setting of known diastolic chf and poorly controlled heart rate from Afib.   ASSESSMENT / PLAN:  PULMONARY A: Acute respiratory failure Acute pulmonary edema Bilateral pleural effusions Baseline Home O2 requirement  03/13/2017 - > does NOT  meet criteria for SBT/Extubation in setting of Acute Respiratory Failure due to 60% fip3, circulatory shock and renal failure    P:   PRVC No sbt or extubation 03/13/2017   CARDIOVASCULAR A:  Afib with RVR Acute  diastolic heart failure Shock, worsening, uncertain etiology; Afib/rate related? Sepsis?  I wonder if BP readings are spurious as renal function has been improving over last 2-3 days.    -03/13/2017 ->  YES on Pressor levopjhed. Cirrculatory shock +. CVP 16. CXR wet. IV heparin on hold (A fib) INR > 5  P:  Continue amiodarone gtt  heparin per pharmacy START DIURESIS -  RENAL A:   Acute on chronic renal failure - anuric. Might be acidotic  P:   Aggressie diuresis Check ABG and if acidotic - start bic gtt and would need renal consult  GASTROINTESTINAL A:   No acute issues other than rising LFT 03/13/2017  P:   Continue PPI for stress ulcer prophylaxis Start tube feedings Track LFT 03/14/17   HEMATOLOGIC A:   Polycythemia vera History of DVT P:  Continue heparin gtt Check CBC now  INFECTIOUS A:   Ischemic R foot but no obvious infection 8/9 (WBC down, afebrile)   - 03/13/2017 - RT foot cold  P:   Antibiotics Given (last 72 hours)    Date/Time Action Medication Dose Rate   03/11/17 1500 New Bag/Given   cefUROXime (ZINACEF) 1.5 g in dextrose 5 % 50 mL IVPB 1.5 g    03/11/17 2051 New Bag/Given   cefUROXime (ZINACEF) 1.5 g in dextrose 5 % 50 mL IVPB 1.5 g 100 mL/hr   03/12/17 0900 New Bag/Given   cefUROXime (ZINACEF) 1.5 g in dextrose 5 % 50 mL IVPB 1.5 g 100 mL/hr   03/12/17 1000 New Bag/Given   ceFEPIme (MAXIPIME) 2 g in dextrose 5 % 50 mL IVPB 2 g 100 mL/hr   03/12/17 1010 New Bag/Given   vancomycin (VANCOCIN) 1,750 mg in sodium chloride 0.9 % 500 mL IVPB 1,750 mg 250 mL/hr       ENDOCRINE A:   Hypothyroidism   P:   Continue synthroid  NEUROLOGIC A:   Sedation needs for vent synchrony  03/13/2017 - agitated encephlaopathy   P:   RASS goal: -1 Fentanyl infusion per PAD protocol Start versed prn Continue fent prn Might need versed gtt  FAMILY  - Updates: none bedside 03/13/2017   - Inter-disciplinary family meet or Palliative Care meeting due  by:  day 7     The patient is critically ill with multiple organ systems failure and requires high complexity decision making for assessment and support, frequent evaluation and titration of therapies, application of advanced monitoring technologies and extensive interpretation of multiple databases.   Critical Care Time devoted to patient care services described in this note is  30  Minutes. This time reflects time of care of this signee Dr Brand Males. This critical care time does not reflect procedure time, or teaching time or supervisory time of  PA/NP/Med student/Med Resident etc but could involve care discussion time    Dr. Brand Males, M.D., [facility].C.P Pulmonary and Critical Care Medicine Staff Physician [place] Pager: [phone], If no answer or between  15:00h - 7:00h: call [phone]  03/13/2017 9:12 AM

## 2017-03-13 NOTE — Progress Notes (Signed)
eLink Physician-Brief Progress Note Patient Name: Charlotte [name] DOB: 1941/07/23 MRN: [id number]   Date of Service  03/13/2017  HPI/Events of Note  ABG showing mixed resp and metabolic acidosis with pH 7.11/49/71/15  eICU Interventions  Increase RR to 30 Increase TV to 600 cc 2 amps of bicarb IVP Recheck ABG in 2 hours     Intervention Category Major Interventions: Acid-Base disturbance - evaluation and management  DETERDING,ELIZABETH 03/13/2017, 5:24 AM

## 2017-03-13 NOTE — Progress Notes (Signed)
Progress Note  Patient Name: Charlotte [name] Date of Encounter: 03/13/2017  Primary Cardiologist: Dr Bronson Ing  Subjective   Intubated; does not respond to commands  Inpatient Medications    Scheduled Meds: . aspirin  81 mg Per Tube Daily  . chlorhexidine gluconate (MEDLINE KIT)  15 mL Mouth Rinse BID  . Chlorhexidine Gluconate Cloth  6 each Topical Daily  . docusate  100 mg Per Tube Daily  . feeding supplement (PRO-STAT SUGAR FREE 64)  30 mL Per Tube TID  . fentaNYL (SUBLIMAZE) injection  50 mcg Intravenous Once  . furosemide  40 mg Intravenous Daily  . gabapentin  100 mg Per Tube Q8H  . guaiFENesin  20 mL Per Tube TID  . hydroxyurea  1,000 mg Per Tube Daily  . insulin aspart  0-15 Units Subcutaneous Q4H  . levothyroxine  75 mcg Per NG tube QAC breakfast  . mouth rinse  15 mL Mouth Rinse 10 times per day  . pantoprazole (PROTONIX) IV  40 mg Intravenous Q24H  . polyethylene glycol  17 g Oral Daily  . pravastatin  10 mg Per Tube Daily  . senna-docusate  1 tablet Per Tube BID  . sodium chloride flush  10-40 mL Intracatheter Q12H   Continuous Infusions: . amiodarone 30 mg/hr (03/13/17 0600)  . ceFEPime (MAXIPIME) IV Stopped (03/12/17 1030)  . feeding supplement (VITAL AF 1.2 CAL) 1,000 mL (03/13/17 0600)  . fentaNYL infusion INTRAVENOUS 50 mcg/hr (03/13/17 0600)  . norepinephrine (LEVOPHED) Adult infusion 22 mcg/min (03/13/17 0600)  . phenylephrine (NEO-SYNEPHRINE) Adult infusion Stopped (03/12/17 1245)  . vancomycin     PRN Meds: acetaminophen **OR** acetaminophen, alum & mag hydroxide-simeth, fentaNYL, guaiFENesin-dextromethorphan, hydrALAZINE, labetalol, metoprolol tartrate, ondansetron **OR** ondansetron (ZOFRAN) IV, phenol, sodium chloride flush   Vital Signs    Vitals:   03/13/17 0600 03/13/17 0615 03/13/17 0630 03/13/17 0645  BP: 95/74 101/83 96/70 96/70  Pulse: 79 (!) 106 (!) 101 84  Resp: (!) 30 (!) 30 (!) 30 (!) 30  Temp:      TempSrc:      SpO2: 97%  96% 96% 96%  Weight:      Height:        Intake/Output Summary (Last 24 hours) at 03/13/17 0729 Last data filed at 03/13/17 0600  Gross per 24 hour  Intake          4891.38 ml  Output               90 ml  Net          4801.38 ml   Filed Weights   03/11/17 0500 03/12/17 0500 03/13/17 0500  Weight: 79 kg (174 lb 2.6 oz) 88.8 kg (195 lb 12.3 oz) 90.5 kg (199 lb 8.3 oz)    Telemetry    Atrial fibrillation; rate elevated- Personally Reviewed   Physical Exam   GEN: WD, chronically ill appearing, intubated Neck: Positive JVD Cardiac: irregular, tachycardic, no murmur Respiratory: CTA anteriorly GI: Soft; not distended; presacral edema MS: s/p RLE surgery, cool; 1+ edema Neuro:  does not respond; eyes open   Labs    Chemistry  Recent Labs Lab 03/12/17 0433 03/12/17 2021 03/13/17 0308 03/13/17 0501  NA 140 136 134*  --   K 3.0* 4.7 4.4  --   CL 107 107 108  --   CO2 21* 19* 16*  --   GLUCOSE 100* 166* 156*  --   BUN 36* 42* 44*  --   CREATININE 1.73*  2.31* 2.50*  --   CALCIUM 8.1* 8.3* 7.8*  --   PROT  --   --   --  4.7*  ALBUMIN  --   --   --  2.3*  AST  --   --   --  259*  ALT  --   --   --  146*  ALKPHOS  --   --   --  83  BILITOT  --   --   --  1.6*  GFRNONAA 28* 20* 18*  --   GFRAA 32* 23* 21*  --   ANIONGAP _0 --      Hematology  Recent Labs Lab 03/11/17 0233  03/11/17 1831 03/12/17 1030 03/13/17 0308  WBC 26.1*  --   --  23.4* 22.1*  RBC 4.14  --   --  4.47 4.82  HGB 8.6*  < > 11.2* 10.4* 11.1*  HCT 31.4*  < > 33.0* 35.1* 38.6  MCV 75.8*  --   --  78.5 80.1  MCH 20.8*  --   --  23.3* 23.0*  MCHC 27.4*  --   --  29.6* 28.8*  RDW 25.2*  --   --  24.7* 25.3*  PLT 1,690*  --   --  1,767* 1,631*  < > = values in this interval not displayed.  Radiology    Dg Abd 1 View  Result Date: 03/11/2017 CLINICAL DATA:  Encounter for nasogastric tube placement. EXAM: ABDOMEN - 1 VIEW COMPARISON:  None. FINDINGS: Nasal/orogastric tube passes below  the diaphragm into the stomach. IMPRESSION: Well-positioned nasal/ orogastric tube. Electronically Signed   By: Lajean Manes M.D.   On: 03/11/2017 21:45   Dg Chest Port 1 View  Result Date: 03/12/2017 CLINICAL DATA:  Central line placement. EXAM: PORTABLE CHEST 1 VIEW COMPARISON:  Chest x-ray from earlier today. FINDINGS: Interval placement of a left subclavian central venous catheter with tip at the cavoatrial junction. Endotracheal and enteric tubes are in unchanged position. Stable cardiomegaly. Persistent bibasilar hazy opacities reflecting moderate effusions and atelectasis. No pneumothorax. IMPRESSION: 1. Interval insertion of a left subclavian central venous catheter with tip in good position projecting over the cavoatrial junction. No pneumothorax. 2. Persistent bibasilar hazy opacities, likely combination of effusion and atelectasis. Electronically Signed   By: Titus Dubin M.D.   On: 03/12/2017 09:47   Dg Chest Port 1 View  Result Date: 03/12/2017 CLINICAL DATA:  Endotracheal tube placement, respiratory failure. EXAM: PORTABLE CHEST 1 VIEW COMPARISON:  03/11/2017 FINDINGS: Endotracheal tube with tip 6.1 cm above the carina. Nasogastric tube courses into the region of the stomach and off the inferior portion of the film. Exam demonstrates no change to slight worsening of hazy opacification over the mid to lower lungs likely moderate bilateral pleural effusions with associated bibasilar atelectasis. Right effusion appears slightly improved no left effusion slightly worse. Likely mild associated interstitial edema. Stable cardiomegaly. Remainder of the exam is unchanged. IMPRESSION: Persistent hazy opacification over the mid to lower lungs likely bilateral effusions with bibasilar atelectasis. Right effusion slightly improved on left effusion slightly worse. Mild interstitial edema.  Stable cardiomegaly. Tubes and lines as described. Electronically Signed   By: Marin Olp M.D.   On: 03/12/2017  08:14   Dg Chest Port 1 View  Result Date: 03/11/2017 CLINICAL DATA:  Encounter for endotracheal tube placement. EXAM: PORTABLE CHEST 1 VIEW COMPARISON:  02/12/2017 FINDINGS: Endotracheal tube tip projects 5 cm above the carina. Nasogastric tube passes below the  included field of view, not definitely below diaphragm, but likely into the stomach. All There is opacity in the lung bases obscuring hemidiaphragms extending to the perihilar regions. This is likely due to a combination of moderate pleural effusions with either atelectasis, pneumonia or a combination. No convincing pulmonary edema. Lung apices are clear. No pneumothorax. Skeletal structures are demineralized but grossly intact. IMPRESSION: 1. Endotracheal tube tip projects 5 cm above carina. 2. Nasogastric tube incompletely imaged but likely extends to the stomach. 3. Moderate bilateral pleural effusions with associated atelectasis, pneumonia or a combination. No convincing pulmonary edema. Electronically Signed   By: Lajean Manes M.D.   On: 03/11/2017 20:41    Patient Profile     76 y.o. female GERLINE RATTO a 76 y.o. femalewith past medical history of persistent atrial fibrillation (on Coumadin), chronic diastolic CHF, chronic respiratory failure (on 3L [place] at baseline),HTN, Stage 3 CKD, polycythemia vera, and PVD (s/p right femoral endarterectomy and femoral to above-knee popliteal bypass in 2014, known superficial femoral artery occlusion and occlusion of fem-pop bypass by imaging in 08/2016) who was seen for preoperative cardiac clearanceat the request of Dr. Trula Slade. Nuclear study showed no ischemia or infarction. Pt had right common femoral to tibioperoneal trunk/peroneal bypass 8/8.   Assessment & Plan    1 permanent atrial fibrillation-Patient remains hypotensive postoperatively but some improvement this AM with reduced requirement of norepinephrine. She has long-standing permanent atrial fibrillation and thus I doubt this is  cause of hypotension/shock. Heart rate is elevated. Presently we cannot use beta blockade or calcium channel blockers. Continue IV amiodarone for rate control. We'll resume preadmission medications as blood pressure improves. Continue heparin. Note troponin only minimally elevated and this does not appear to be ischemia mediated. Will check echocardiogram for LV function.   2 Vascular disease-right foot remains cool. Managed by vascular surgery.  3 chronic diastolic congestive heart failure-patient remains volume overloaded. CVP 22. Would gently diurese as tolerated by BP. Increase lasix to 40 mg BID. Follow renal function.   4 acute on chronic stage III kidney disease-follow renal function closely with diuresis.  5 VDRF-management per CCM.  6 hypokalemia-supplement   Signed, Kirk Ruths, MD  03/13/2017, 7:29 AM

## 2017-03-13 NOTE — Progress Notes (Signed)
CRITICAL VALUE ALERT  Critical Value:  INR 5.97  Date & Time Notied:  5834 03/13/2017   Provider Notified: Dr. Jimmy Footman  Orders Received/Actions taken: Labs ordered

## 2017-03-13 NOTE — Progress Notes (Signed)
Pharmacy Antibiotic Note  Charlotte [name] is a 76 y.o. female admitted on 03/04/2017 with pneumonia.  Pharmacy managing Cefepime and Vancomycin dosing.   SCr up to 2.5 (BL~ 1.1), WBC down to 22.1, Est. CrCl ~ 25 mL/min   Plan: -Decrease Vancomycin to 1000 IV every 24 hours.  Goal trough 15-20 mcg/mL.  -Continue Cefepime 2 gm IV Q 24 hours  -Monitor CBC, renal fx, cultures and clinical progress -VT at [facility]   Height: 6' (182.9 cm) Weight: 199 lb 8.3 oz (90.5 kg) IBW/kg (Calculated) : 73.1  Temp (24hrs), Avg:99.3 F (37.4 C), Min:97.8 F (36.6 C), Max:101.4 F (38.6 C)   Recent Labs Lab 03/09/17 0254  03/10/17 0336 03/11/17 0233 03/11/17 2115 03/11/17 2343 03/12/17 0433 03/12/17 1030 03/12/17 1553 03/12/17 2021 03/13/17 0308  WBC 33.0*  --  25.6* 26.1*  --   --   --  23.4*  --   --  22.1*  CREATININE  --   < > 2.30* 1.99*  --   --  1.73*  --   --  2.31* 2.50*  LATICACIDVEN  --   --   --   --  0.9 0.9  --  3.0* 1.5  --   --   < > = values in this interval not displayed.  Estimated Creatinine Clearance: 25 mL/min (A) (by C-G formula based on SCr of 2.5 mg/dL (H)).    No Known Allergies  Antimicrobials this admission: Vanc 8/9 >>  Cefepime 8/9 >>   Dose adjustments this admission: None   Microbiology results: 8/9 Trach asp >> pending  8/8 Trach asp >> rare GPC 8/3 MRSA PCR: neg  Thank you for allowing pharmacy to be a part of this patient's care.  Albertina Parr, PharmD., BCPS Clinical Pharmacist Pager [phone]

## 2017-03-13 NOTE — Progress Notes (Signed)
PT Cancellation Note  Patient Details Name: Charlotte [name] MRN: [id number] DOB: [birth year]   Cancelled Treatment:    Reason Eval/Treat Not Completed: Patient not medically ready.  Will check back 8/11, but expect may still not be ready. 03/13/2017  Donnella Sham, [place] [phone]  (pager)   Tessie Fass Jaesean Litzau 03/13/2017, 2:34 PM

## 2017-03-14 ENCOUNTER — Encounter (HOSPITAL_COMMUNITY): Payer: Medicare Other

## 2017-03-14 ENCOUNTER — Encounter (HOSPITAL_COMMUNITY): Payer: Self-pay

## 2017-03-14 ENCOUNTER — Inpatient Hospital Stay (HOSPITAL_COMMUNITY): Payer: Medicare Other

## 2017-03-14 DIAGNOSIS — L8990 Pressure ulcer of unspecified site, unspecified stage: Secondary | ICD-10-CM | POA: Insufficient documentation

## 2017-03-14 LAB — CULTURE, RESPIRATORY W GRAM STAIN

## 2017-03-14 LAB — CBC
HEMATOCRIT: 34.9 % — AB (ref 36.0–46.0)
HEMATOCRIT: 35 % — AB (ref 36.0–46.0)
HEMOGLOBIN: 10.6 g/dL — AB (ref 12.0–15.0)
HEMOGLOBIN: 10.8 g/dL — AB (ref 12.0–15.0)
MCH: 22.6 pg — ABNORMAL LOW (ref 26.0–34.0)
MCH: 23 pg — ABNORMAL LOW (ref 26.0–34.0)
MCHC: 30.4 g/dL (ref 30.0–36.0)
MCHC: 30.9 g/dL (ref 30.0–36.0)
MCV: 74.4 fL — AB (ref 78.0–100.0)
MCV: 74.5 fL — AB (ref 78.0–100.0)
Platelets: 1171 10*3/uL (ref 150–400)
Platelets: 1134 10*3/uL (ref 150–400)
RBC: 4.69 MIL/uL (ref 3.87–5.11)
RBC: 4.7 MIL/uL (ref 3.87–5.11)
RDW: 25.7 % — AB (ref 11.5–15.5)
RDW: 25.5 % — AB (ref 11.5–15.5)
WBC: 25.2 10*3/uL — AB (ref 4.0–10.5)
WBC: 29.1 10*3/uL — AB (ref 4.0–10.5)

## 2017-03-14 LAB — CULTURE, RESPIRATORY

## 2017-03-14 LAB — HEPATIC FUNCTION PANEL
ALBUMIN: 1.9 g/dL — AB (ref 3.5–5.0)
ALK PHOS: 74 U/L (ref 38–126)
ALT: 285 U/L — ABNORMAL HIGH (ref 14–54)
AST: 150 U/L — ABNORMAL HIGH (ref 15–41)
BILIRUBIN DIRECT: 0.6 mg/dL — AB (ref 0.1–0.5)
BILIRUBIN TOTAL: 1.5 mg/dL — AB (ref 0.3–1.2)
Indirect Bilirubin: 0.9 mg/dL (ref 0.3–0.9)
TOTAL PROTEIN: 4.5 g/dL — AB (ref 6.5–8.1)

## 2017-03-14 LAB — GLUCOSE, CAPILLARY
GLUCOSE-CAPILLARY: 129 mg/dL — AB (ref 65–99)
GLUCOSE-CAPILLARY: 122 mg/dL — AB (ref 65–99)
Glucose-Capillary: 146 mg/dL — ABNORMAL HIGH (ref 65–99)
Glucose-Capillary: 165 mg/dL — ABNORMAL HIGH (ref 65–99)
Glucose-Capillary: 136 mg/dL — ABNORMAL HIGH (ref 65–99)

## 2017-03-14 LAB — POCT I-STAT 3, ART BLOOD GAS (G3+)
Acid-base deficit: 11 mmol/L — ABNORMAL HIGH (ref 0.0–2.0)
Bicarbonate: 13.4 mmol/L — ABNORMAL LOW (ref 20.0–28.0)
O2 Saturation: 94 %
PH ART: 7.338 — AB (ref 7.350–7.450)
TCO2: 14 mmol/L (ref 0–100)
pCO2 arterial: 25 mmHg — ABNORMAL LOW (ref 32.0–48.0)
pO2, Arterial: 73 mmHg — ABNORMAL LOW (ref 83.0–108.0)

## 2017-03-14 LAB — BASIC METABOLIC PANEL
ANION GAP: 12 (ref 5–15)
BUN: 59 mg/dL — ABNORMAL HIGH (ref 6–20)
CALCIUM: 7.6 mg/dL — AB (ref 8.9–10.3)
CO2: 15 mmol/L — ABNORMAL LOW (ref 22–32)
Chloride: 108 mmol/L (ref 101–111)
Creatinine, Ser: 2.6 mg/dL — ABNORMAL HIGH (ref 0.44–1.00)
GFR, EST AFRICAN AMERICAN: 20 mL/min — AB (ref >60)
GFR, EST NON AFRICAN AMERICAN: 17 mL/min — AB (ref >60)
GLUCOSE: 140 mg/dL — AB (ref 65–99)
Potassium: 3.4 mmol/L — ABNORMAL LOW (ref 3.5–5.1)
SODIUM: 135 mmol/L (ref 135–145)

## 2017-03-14 LAB — PROTIME-INR
INR: 5.49
Prothrombin Time: 51.6 seconds — ABNORMAL HIGH (ref 11.4–15.2)

## 2017-03-14 LAB — BLOOD PRODUCT ORDER (VERBAL) VERIFICATION

## 2017-03-14 LAB — PROCALCITONIN: Procalcitonin: 8.73 ng/mL

## 2017-03-14 MED ORDER — CHLORHEXIDINE GLUCONATE CLOTH 2 % EX PADS
6.0000 | MEDICATED_PAD | Freq: Every day | CUTANEOUS | Status: DC
  Administered 2017-03-14 – 2017-03-21 (×8): 6 via TOPICAL

## 2017-03-14 MED ORDER — CEFTRIAXONE SODIUM 1 G IJ SOLR: 1.0000 g | INTRAMUSCULAR | Status: DC

## 2017-03-14 MED ORDER — DEXTROSE 5 % IV SOLN
1.0000 g | INTRAVENOUS | Status: DC
  Administered 2017-03-15: 1 g via INTRAVENOUS
  Filled 2017-03-14 (×2): qty 10

## 2017-03-14 NOTE — Progress Notes (Signed)
PT Cancellation Note  Patient Details Name: Charlotte [name] MRN: [id number] DOB: [birth year]   Cancelled Treatment:    Reason Eval/Treat Not Completed: Patient not medically ready. Pt remains intubated and sedated per Cardiology this AM. PT will continue to f/u with pt as available and appropriate.   [place] 03/14/2017, 8:19 AM

## 2017-03-14 NOTE — Progress Notes (Signed)
PULMONARY / CRITICAL CARE MEDICINE   Name: Charlotte [name] MRN: [id number] DOB: 05-Aug-1940    ADMISSION DATE:  03/04/2017 CONSULTATION DATE:  03/11/2017  REFERRING MD:  Windell Moulding  CHIEF COMPLAINT:  Leg pain  BRIEF:   76 y/o female with known Afib, polycythemia, and peripheral vascular disease and CHF on home O2 admitted on 8/1 with leg pain due to limb ischemia.  On 8/8 underwent R fem to tp trunk bypass, returned to the ICU intubated.    SUBJECTIVE:  RN reports doppler positive pulses in RLE, no acute events.  Remains on amiodarone 30 mg/hr, levohped 22 mcg, fentanyl 100 mcg.     VITAL SIGNS: BP 102/65   Pulse (!) 127   Temp 98.9 F (37.2 C) (Oral)   Resp (!) 30   Ht 6' (1.829 m)   Wt 216 lb 14.9 oz (98.4 kg)   SpO2 94%   BMI 29.42 kg/m   HEMODYNAMICS: CVP:  [13 mmHg-16 mmHg] 14 mmHg  VENTILATOR SETTINGS: Vent Mode: PRVC FiO2 (%):  [60 %-70 %] 60 % Set Rate:  [30 bmp] 30 bmp Vt Set:  [600 mL] 600 mL PEEP:  [10 cmH20] 10 cmH20 Plateau Pressure:  [25 cmH20-28 cmH20] 26 cmH20  INTAKE / OUTPUT: I/O last 3 completed shifts: In: 4928.3 [I.V.:2674.3; Other:10; NG/GT:1870; IV Piggyback:374] Out: 445 [Urine:445]  PHYSICAL EXAMINATION: General: ill appearing elderly female in NAD on vent  HEENT: MM pink/moist, ETT Neuro: opens eyes to voice, no follow commands, generalized weakness  CV: s1s2 rrr, no m/r/g PULM: even/non-labored, lungs bilaterally coarse anterior, diminished lower NW:GNFA, non-tender, bsx4 active  Extremities: LLE wnl, RLE post-op bypass, cool, pulses + doppler, blue discoloration.  Incision c/d/i, R hand warm (radial aline site) Skin: no rashes or lesions   LABS:  BMET  Recent Labs Lab 03/13/17 0308 03/13/17 1544 03/14/17 0418  NA 134* 136 135  K 4.4 3.8 3.4*  CL 108 110 108  CO2 16* 14* 15*  BUN 44* 49* 59*  CREATININE 2.50* 2.51* 2.60*  GLUCOSE 156* 134* 140*    Electrolytes  Recent Labs Lab 03/12/17 1030 03/12/17 1553   03/13/17 0308 03/13/17 1544 03/14/17 0418  CALCIUM  --   --   < > 7.8* 7.6* 7.6*  MG 2.2 2.0  --  2.0  --   --   PHOS 5.3* 5.0*  --  5.4*  --   --   < > = values in this interval not displayed.  CBC  Recent Labs Lab 03/12/17 1030 03/13/17 0308 03/14/17 0635  WBC 23.4* 22.1* 25.2*  HGB 10.4* 11.1* 10.6*  HCT 35.1* 38.6 34.9*  PLT 1,767* 1,631* 1,171*    Coag's  Recent Labs Lab 03/12/17 0433 03/13/17 0308 03/14/17 0418  INR 2.30 5.97* 5.49*    Sepsis Markers  Recent Labs Lab 03/11/17 2343  03/12/17 1030 03/12/17 1553 03/13/17 0308 03/14/17 0418  LATICACIDVEN 0.9  --  3.0* 1.5  --   --   PROCALCITON  --   < > 0.75  --  5.08 8.73  < > = values in this interval not displayed.  ABG  Recent Labs Lab 03/13/17 0955 03/13/17 1553 03/14/17 0424  PHART 7.341* 7.396 7.338*  PCO2ART 27.4* 23.7* 25.0*  PO2ART 58.2* 68.0* 73.0*    Liver Enzymes  Recent Labs Lab 03/13/17 0501 03/14/17 0418  AST 259* 150*  ALT 146* 285*  ALKPHOS 83 74  BILITOT 1.6* 1.5*  ALBUMIN 2.3* 1.9*    Cardiac Enzymes  Recent Labs Lab 03/12/17 1030  TROPONINI 0.16*    Glucose  Recent Labs Lab 03/13/17 1155 03/13/17 1609 03/13/17 1945 03/14/17 0107 03/14/17 0422 03/14/17 0905  GLUCAP 124* 117* 125* 146* 165* 136*    Imaging Dg Chest Port 1 View  Result Date: 03/14/2017 CLINICAL DATA:  Endotracheal tube placement. EXAM: PORTABLE CHEST 1 VIEW COMPARISON:  03/13/2017 FINDINGS: Endotracheal tube tip is 4.8 cm above the carina. Left subclavian central venous catheter with tip over the SVC unchanged. Nasogastric tube courses into the region of the stomach and off the inferior portion of the film unchanged. Lungs are adequately inflated demonstrate persistent hazy opacification over the mid to lower lungs bilaterally suggesting moderate size effusions with associated atelectasis. Cannot exclude a component of interstitial edema or infection. Stable cardiomegaly. Remainder the  exam is unchanged. IMPRESSION: Stable hazy airspace density over the mid to lower lung suggesting moderate effusions with atelectasis. Cannot exclude infection or component of mild interstitial edema. Stable cardiomegaly. Tubes and lines as described. Electronically Signed   By: Marin Olp M.D.   On: 03/14/2017 07:28     STUDIES:  NM Myocar 8/4 > No decreased activity in the left ventricle on stress imaging to suggest reversible ischemia or infarction. Normal left ventricular wall motion. No left ventricular dilation. Left Ventricular Ejection Fraction: 69 % US renal 8/6 > No evidence of hydronephrosis, mildly increased renal parenchymal echogenicity may reflect medical renal disease CXR 8/8 >  Moderate bilateral pleural effusions with associated atelectasis, pneumonia or a combination. No convincing pulmonary edema  CULTURES: MRSA by PCR 8/3 > Negative  Staph A. 8/3 > Negative  Sputum 8/3 >> moderate klebsiella pneumoniae >> S- rocephine  ANTIBIOTICS: Cefuroxime 8/8 >> 8/11 Rocephin 8/11 >>   SIGNIFICANT EVENTS: 8/01  Presents to ED  8/08  Underwent right lower extremity bypass  8/09  On neo overnight, oliguric, increased O2 needs, concern for worsening limb ischemia  LINES/TUBES: ETT 8/8 >> Right Radial 8/8 >>   DISCUSSION: 76 y/o chronically ill lady with critical R leg ischemia due to peripheral vascular disease, now with post operative respiratory failure presumably due to acute pulmonary edema in the setting of known diastolic CHF and poorly controlled heart rate from Afib.   ASSESSMENT / PLAN:  PULMONARY A: Acute on Chronic Hypoxic Respiratory Failure  Bilateral Pleural Effusions  Acute Pulmonary Edema  P:   PRVC 8 cc/kg  Wean PEEP / FiO2 for sats > 92% VAP prevention measures  Intermittent CXR Daily WUA / SBT  Consider assessment of effusions in am on CXR  CARDIOVASCULAR A:  Afib with RVR Acute diastolic heart failure Shock, worsening, uncertain  etiology; Afib/rate related? Sepsis?  I wonder if BP readings are spurious as renal function has been improving over last 2-3 days. P:  ICU monitoring  Follow CVP Continue amiodarone  RENAL A:   Hypokalemia Acute on chronic renal failure Metabolic Acidosis  P:   Trend BMP / urinary output Replace electrolytes as indicated Avoid nephrotoxic agents, ensure adequate renal perfusion May need Nephrology input pending sr cr trend / acidosis > no emergent HD needs at this time Discontinue vanco   GASTROINTESTINAL A:   Elevated LFT's - ? 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     CC Time: 30 minutes  Noe Gens, NP-C [facility] Pgr: [phone] or if no answer [phone] 03/14/2017, 9:58 AM

## 2017-03-14 NOTE — Progress Notes (Addendum)
Pharmacy Antibiotic/Anticoagulant Note  Charlotte [name] is a 76 y.o. female admitted on 03/04/2017 with pneumonia.  Pharmacy managing Cefepime and Vancomycin dosing. SCr up to 2.6 (BL~ 1.1) with poor UOP, WBC and PCT back up.   Pharmacy is also consulted for anticoagulation. Heparin currently held as INR today is 5.49. Pt on warfarin PTA for AFib, last dose 8/1.  Plan: -Continue Vancomycin 1000 IV every 24 hours.  Goal trough 15-20 mcg/mL.  -Continue cefepime 2g IV Q 24 hours  -Monitor CBC, renal fx, cultures and clinical progress -VT at [facility] -Continue to hold heparin and warfarin -INR daily  Height: 6' (182.9 cm) Weight: 216 lb 14.9 oz (98.4 kg) IBW/kg (Calculated) : 73.1  Temp (24hrs), Avg:98.4 F (36.9 C), Min:97.8 F (36.6 C), Max:98.9 F (37.2 C)   Recent Labs Lab 03/10/17 0336 03/11/17 0233 03/11/17 2115 03/11/17 2343 03/12/17 0433 03/12/17 1030 03/12/17 1553 03/12/17 2021 03/13/17 0308 03/13/17 1544 03/14/17 0418 03/14/17 0635  WBC 25.6* 26.1*  --   --   --  23.4*  --   --  22.1*  --   --  25.2*  CREATININE 2.30* 1.99*  --   --  1.73*  --   --  2.31* 2.50* 2.51* 2.60*  --   LATICACIDVEN  --   --  0.9 0.9  --  3.0* 1.5  --   --   --   --   --     Estimated Creatinine Clearance: 24.9 mL/min (A) (by C-G formula based on SCr of 2.6 mg/dL (H)).    No Known Allergies  Antimicrobials this admission: Vanc 8/9 >>  Cefepime 8/9 >>   Dose adjustments this admission: None   Microbiology results: 8/9 Trach asp >> moderate Kleb pneumoniae, rare yeast 8/8 Trach asp >> rare GPC 8/3 MRSA PCR: neg  Thank you for allowing pharmacy to be a part of this patient's care.  Arrie Senate, PharmD PGY-2 Cardiology Pharmacy Resident Pager: [phone] 03/14/2017

## 2017-03-14 NOTE — Plan of Care (Signed)
Problem: Activity: Goal: Ability to tolerate increased activity will improve Outcome: Not Progressing Pt remains intubated and sedated  Problem: Coping: Goal: Level of anxiety will decrease Outcome: Progressing Decreasing sedation  Problem: Nutritional: Goal: Intake of prescribed amount of daily calories will improve Outcome: Progressing Tolerating tube feeds at goal  Problem: Respiratory: Goal: Ability to maintain a clear airway and adequate ventilation will improve Outcome: Progressing Unable to wean  Problem: Role Relationship: Goal: Method of communication will improve Outcome: Not Progressing Intubated and sedated

## 2017-03-14 NOTE — Progress Notes (Signed)
  Progress Note    03/14/2017 11:42 AM 3 Days Post-Op  Subjective: remains intubated and sedated  Vitals:   03/14/17 0930 03/14/17 1000  BP:  90/65  Pulse: (!) 127 (!) 116  Resp: (!) 30 (!) 30  Temp:    SpO2: 94% 95%    Physical Exam: Intubated and sedated, no neuro function evaluated RLE incisions are cdi with staples R foot is warmer with good cap refill. Monophasic signal at pt  CBC    Component Value Date/Time   WBC 29.1 (H) 03/14/2017 0930   RBC 4.70 03/14/2017 0930   HGB 10.8 (L) 03/14/2017 0930   HCT 35.0 (L) 03/14/2017 0930   PLT 1,134 (HH) 03/14/2017 0930   MCV 74.5 (L) 03/14/2017 0930   MCH 23.0 (L) 03/14/2017 0930   MCHC 30.9 03/14/2017 0930   RDW 25.5 (H) 03/14/2017 0930   LYMPHSABS 0.7 03/13/2017 0308   MONOABS 1.5 (H) 03/13/2017 0308   EOSABS 0.0 03/13/2017 0308   BASOSABS 0.0 03/13/2017 0308    BMET    Component Value Date/Time   NA 135 03/14/2017 0418   K 3.4 (L) 03/14/2017 0418   CL 108 03/14/2017 0418   CO2 15 (L) 03/14/2017 0418   GLUCOSE 140 (H) 03/14/2017 0418   BUN 59 (H) 03/14/2017 0418   CREATININE 2.60 (H) 03/14/2017 0418   CREATININE 1.21 (H) 08/14/2016 0733   CALCIUM 7.6 (L) 03/14/2017 0418   GFRNONAA 17 (L) 03/14/2017 0418   GFRAA 20 (L) 03/14/2017 0418    INR    Component Value Date/Time   INR 5.49 (HH) 03/14/2017 0418     Intake/Output Summary (Last 24 hours) at 03/14/17 1142 Last data filed at 03/14/17 1000  Gross per 24 hour  Intake          2760.98 ml  Output              455 ml  Net          2305.98 ml     Assessment:  76 y.o. female is s/p R fem-tp trunk bypass with in situ vein  Plan: Ccm managing cardiopulmonary issues Keep right foot warm. Family updated   Eda Paschal. [name], MD [facility] Office: [phone] Pager: [phone]  03/14/2017 11:42 AM

## 2017-03-14 NOTE — Progress Notes (Signed)
Progress Note  Patient Name: Charlotte [name] Date of Encounter: 03/14/2017  Primary Cardiologist: Dr Bronson Ing  Subjective   Intubated; sedated; not responsive  Inpatient Medications    Scheduled Meds: . aspirin  81 mg Per Tube Daily  . chlorhexidine gluconate (MEDLINE KIT)  15 mL Mouth Rinse BID  . Chlorhexidine Gluconate Cloth  6 each Topical Daily  . docusate  100 mg Per Tube Daily  . feeding supplement (PRO-STAT SUGAR FREE 64)  30 mL Per Tube TID  . fentaNYL (SUBLIMAZE) injection  50 mcg Intravenous Once  . gabapentin  100 mg Per Tube Q8H  . guaiFENesin  20 mL Per Tube TID  . hydroxyurea  1,000 mg Per Tube Daily  . insulin aspart  0-15 Units Subcutaneous Q4H  . levothyroxine  75 mcg Per NG tube QAC breakfast  . mouth rinse  15 mL Mouth Rinse 10 times per day  . pantoprazole (PROTONIX) IV  40 mg Intravenous Q24H  . polyethylene glycol  17 g Oral Daily  . pravastatin  10 mg Per Tube Daily  . senna-docusate  1 tablet Per Tube BID  . sodium chloride flush  10-40 mL Intracatheter Q12H   Continuous Infusions: . amiodarone 30 mg/hr (03/14/17 0636)  . ceFEPime (MAXIPIME) IV Stopped (03/13/17 1111)  . feeding supplement (VITAL AF 1.2 CAL) 1,000 mL (03/14/17 0500)  . fentaNYL infusion INTRAVENOUS 100 mcg/hr (03/14/17 0500)  . furosemide Stopped (03/14/17 0316)  . norepinephrine (LEVOPHED) Adult infusion 22 mcg/min (03/14/17 0500)  . phenylephrine (NEO-SYNEPHRINE) Adult infusion Stopped (03/12/17 1245)  . vancomycin Stopped (03/13/17 1157)   PRN Meds: alum & mag hydroxide-simeth, fentaNYL, guaiFENesin-dextromethorphan, hydrALAZINE, labetalol, metoprolol tartrate, midazolam, ondansetron **OR** ondansetron (ZOFRAN) IV, phenol, sodium chloride flush   Vital Signs    Vitals:   03/14/17 0445 03/14/17 0500 03/14/17 0515 03/14/17 0530  BP:  100/60    Pulse: (!) 101 (!) 107 (!) 106 96  Resp: (!) 30 (!) 30 (!) 30 (!) 30  Temp:      TempSrc:      SpO2: 97% 97% 97% 97%    Weight:  98.4 kg (216 lb 14.9 oz)    Height:        Intake/Output Summary (Last 24 hours) at 03/14/17 0716 Last data filed at 03/14/17 0636  Gross per 24 hour  Intake          2715.63 ml  Output              425 ml  Net          2290.63 ml   Filed Weights   03/12/17 0500 03/13/17 0500 03/14/17 0500  Weight: 88.8 kg (195 lb 12.3 oz) 90.5 kg (199 lb 8.3 oz) 98.4 kg (216 lb 14.9 oz)    Telemetry    Atrial fibrillation; rate elevated but improved- Personally Reviewed   Physical Exam   GEN: WD, chronically ill appearing, intubated, not responsive Neck: JVP 12 cm Cardiac: irregular, mildly tachycardic Respiratory: CTA anteriorly; no wheeze GI: Soft; not distended; no masses MS: s/p RLE surgery, cool; blistering noted Neuro:  not responsive; sedated  Labs    Chemistry  Recent Labs Lab 03/13/17 0308 03/13/17 0501 03/13/17 1544 03/14/17 0418  NA 134*  --  136 135  K 4.4  --  3.8 3.4*  CL 108  --  110 108  CO2 16*  --  14* 15*  GLUCOSE 156*  --  134* 140*  BUN 44*  --  49*  59*  CREATININE 2.50*  --  2.51* 2.60*  CALCIUM 7.8*  --  7.6* 7.6*  PROT  --  4.7*  --  4.5*  ALBUMIN  --  2.3*  --  1.9*  AST  --  259*  --  150*  ALT  --  146*  --  285*  ALKPHOS  --  83  --  74  BILITOT  --  1.6*  --  1.5*  GFRNONAA 18*  --  18* 17*  GFRAA 21*  --  21* 20*  ANIONGAP 10  --  12 12     Hematology  Recent Labs Lab 03/11/17 0233  03/11/17 1831 03/12/17 1030 03/13/17 0308  WBC 26.1*  --   --  23.4* 22.1*  RBC 4.14  --   --  4.47 4.82  HGB 8.6*  < > 11.2* 10.4* 11.1*  HCT 31.4*  < > 33.0* 35.1* 38.6  MCV 75.8*  --   --  78.5 80.1  MCH 20.8*  --   --  23.3* 23.0*  MCHC 27.4*  --   --  29.6* 28.8*  RDW 25.2*  --   --  24.7* 25.3*  PLT 1,690*  --   --  1,767* 1,631*  < > = values in this interval not displayed.  Radiology    Dg Chest Port 1 View  Result Date: 03/13/2017 CLINICAL DATA:  Acute respiratory failure with hypoxemia EXAM: PORTABLE CHEST 1 VIEW  COMPARISON:  03/12/2017 FINDINGS: Endotracheal tube in good position. Central venous catheter tip SVC. NG in the proximal stomach unchanged. Moderate bilateral effusions unchanged. Bibasilar airspace disease unchanged. Probable heart failure with edema. IMPRESSION: No significant change. Bilateral pleural effusions with bibasilar consolidation. Probable heart failure. Electronically Signed   By: Marlan Palau M.D.   On: 03/13/2017 09:21   Dg Chest Port 1 View  Result Date: 03/12/2017 CLINICAL DATA:  Central line placement. EXAM: PORTABLE CHEST 1 VIEW COMPARISON:  Chest x-ray from earlier today. FINDINGS: Interval placement of a left subclavian central venous catheter with tip at the cavoatrial junction. Endotracheal and enteric tubes are in unchanged position. Stable cardiomegaly. Persistent bibasilar hazy opacities reflecting moderate effusions and atelectasis. No pneumothorax. IMPRESSION: 1. Interval insertion of a left subclavian central venous catheter with tip in good position projecting over the cavoatrial junction. No pneumothorax. 2. Persistent bibasilar hazy opacities, likely combination of effusion and atelectasis. Electronically Signed   By: Obie Dredge M.D.   On: 03/12/2017 09:47    Patient Profile     76 y.o. female Charlotte [name] a 76 y.o. femalewith past medical history of persistent atrial fibrillation (on Coumadin), chronic diastolic CHF, chronic respiratory failure (on 3L [place] at baseline),HTN, Stage 3 CKD, polycythemia vera, and PVD (s/p right femoral endarterectomy and femoral to above-knee popliteal bypass in 2014, known superficial femoral artery occlusion and occlusion of fem-pop bypass by imaging in 08/2016) who was seen for preoperative cardiac clearanceat the request of Dr. Myra Gianotti. Nuclear study showed no ischemia or infarction. Pt had right common femoral to tibioperoneal trunk/peroneal bypass 8/8.   Assessment & Plan    1 permanent atrial fibrillation-Patient  remains hypotensive postoperatively; continue present dose of levophed and wean as tolerated. She has long-standing permanent atrial fibrillation and thus I doubt this is cause of hypotension/shock. Heart rate improved; continue IV amiodarone for rate control. Resume preadmission dose of cardizem and metoprolol later as BP allows. Continue heparin. Echo shows normal LV function. There is note of severe RV  dysfunction.   2 Vascular disease-right foot remains cool and now with blistering. Vascular surgery following.  3 chronic diastolic congestive heart failure-patient remains volume overloaded. CVP 13. Continue aggressive diuresis and follow renal function.   4 acute on chronic stage III kidney disease-renal function slightly worse today; may need nephrology eval.  5 VDRF-management per CCM.  6 hypokalemia-supplement   Signed, Kirk Ruths, MD  03/14/2017, 7:16 AM

## 2017-03-14 NOTE — Plan of Care (Signed)
Problem: Pain Managment: Goal: General experience of comfort will improve Outcome: Progressing Continuous fentanyl infusion.  Problem: Skin Integrity: Goal: Risk for impaired skin integrity will decrease Outcome: Progressing Pt has multiple serous blisters, weeping from legs. Right leg incisions clean, approximated, some minimal serosanguinous oozing.  Problem: Fluid Volume: Goal: Ability to maintain a balanced intake and output will improve Outcome: Not Progressing Pt positive fluid d/t minimal urine output.

## 2017-03-15 ENCOUNTER — Inpatient Hospital Stay (HOSPITAL_COMMUNITY): Payer: Medicare Other

## 2017-03-15 DIAGNOSIS — Z95828 Presence of other vascular implants and grafts: Secondary | ICD-10-CM

## 2017-03-15 LAB — CBC WITH DIFFERENTIAL/PLATELET
BASOS ABS: 0.3 10*3/uL — AB (ref 0.0–0.1)
BASOS PCT: 1 %
EOS ABS: 0.3 10*3/uL (ref 0.0–0.7)
Eosinophils Relative: 1 %
HCT: 32.1 % — ABNORMAL LOW (ref 36.0–46.0)
Hemoglobin: 10.3 g/dL — ABNORMAL LOW (ref 12.0–15.0)
LYMPHS ABS: 0.8 10*3/uL (ref 0.7–4.0)
LYMPHS PCT: 3 %
MCH: 23.4 pg — ABNORMAL LOW (ref 26.0–34.0)
MCHC: 32.1 g/dL (ref 30.0–36.0)
MCV: 73 fL — ABNORMAL LOW (ref 78.0–100.0)
MONO ABS: 2.2 10*3/uL — AB (ref 0.1–1.0)
Monocytes Relative: 8 %
NEUTROS PCT: 87 %
Neutro Abs: 24.1 10*3/uL — ABNORMAL HIGH (ref 1.7–7.7)
Platelets: 895 10*3/uL — ABNORMAL HIGH (ref 150–400)
RBC: 4.4 MIL/uL (ref 3.87–5.11)
RDW: 25.6 % — AB (ref 11.5–15.5)
WBC: 27.7 10*3/uL — AB (ref 4.0–10.5)

## 2017-03-15 LAB — GLUCOSE, CAPILLARY
GLUCOSE-CAPILLARY: 117 mg/dL — AB (ref 65–99)
GLUCOSE-CAPILLARY: 118 mg/dL — AB (ref 65–99)
Glucose-Capillary: 101 mg/dL — ABNORMAL HIGH (ref 65–99)
Glucose-Capillary: 111 mg/dL — ABNORMAL HIGH (ref 65–99)
Glucose-Capillary: 126 mg/dL — ABNORMAL HIGH (ref 65–99)
Glucose-Capillary: 130 mg/dL — ABNORMAL HIGH (ref 65–99)

## 2017-03-15 LAB — BASIC METABOLIC PANEL
ANION GAP: 12 (ref 5–15)
BUN: 69 mg/dL — ABNORMAL HIGH (ref 6–20)
CALCIUM: 7.7 mg/dL — AB (ref 8.9–10.3)
CHLORIDE: 109 mmol/L (ref 101–111)
CO2: 14 mmol/L — AB (ref 22–32)
Creatinine, Ser: 2.59 mg/dL — ABNORMAL HIGH (ref 0.44–1.00)
GFR calc non Af Amer: 17 mL/min — ABNORMAL LOW (ref >60)
GFR, EST AFRICAN AMERICAN: 20 mL/min — AB (ref >60)
GLUCOSE: 129 mg/dL — AB (ref 65–99)
POTASSIUM: 3.1 mmol/L — AB (ref 3.5–5.1)
Sodium: 135 mmol/L (ref 135–145)

## 2017-03-15 LAB — TYPE AND SCREEN
ABO/RH(D): A POS
Antibody Screen: NEGATIVE
UNIT DIVISION: 0
UNIT DIVISION: 0
Unit division: 0
Unit division: 0

## 2017-03-15 LAB — BPAM RBC
BLOOD PRODUCT EXPIRATION DATE: 201808292359
BLOOD PRODUCT EXPIRATION DATE: 201808292359
Blood Product Expiration Date: 201808292359
Blood Product Expiration Date: 201808292359
ISSUE DATE / TIME: 201808081456
ISSUE DATE / TIME: 201808081456
ISSUE DATE / TIME: 201808081456
ISSUE DATE / TIME: 201808081456
UNIT TYPE AND RH: 6200
UNIT TYPE AND RH: 6200
Unit Type and Rh: 6200
Unit Type and Rh: 6200

## 2017-03-15 LAB — PROTIME-INR
INR: 2.92
PROTHROMBIN TIME: 31.1 s — AB (ref 11.4–15.2)

## 2017-03-15 LAB — HEPATIC FUNCTION PANEL
ALBUMIN: 1.7 g/dL — AB (ref 3.5–5.0)
ALT: 236 U/L — AB (ref 14–54)
AST: 146 U/L — AB (ref 15–41)
Alkaline Phosphatase: 82 U/L (ref 38–126)
BILIRUBIN DIRECT: 0.4 mg/dL (ref 0.1–0.5)
BILIRUBIN TOTAL: 1.4 mg/dL — AB (ref 0.3–1.2)
Indirect Bilirubin: 1 mg/dL — ABNORMAL HIGH (ref 0.3–0.9)
Total Protein: 4.4 g/dL — ABNORMAL LOW (ref 6.5–8.1)

## 2017-03-15 MED ORDER — SENNOSIDES 8.8 MG/5ML PO SYRP
5.0000 mL | ORAL_SOLUTION | Freq: Two times a day (BID) | ORAL | Status: DC
  Administered 2017-03-17 – 2017-03-19 (×5): 5 mL
  Filled 2017-03-15 (×5): qty 5

## 2017-03-15 MED ORDER — POTASSIUM CHLORIDE 10 MEQ/50ML IV SOLN
10.0000 meq | INTRAVENOUS | Status: AC
  Administered 2017-03-15 (×2): 10 meq via INTRAVENOUS
  Filled 2017-03-15 (×2): qty 50

## 2017-03-15 NOTE — Progress Notes (Signed)
Pharmacy Anticoagulant Note  Charlotte [name] is a 76 y.o. female admitted on 03/04/2017 with pneumonia. Pt is on warfarin PTA for AFib with last dose 8/1. Pharmacy is consulted for anticoagulation. Plan is to initiate heparin once INR < 2. INR is trending down but remains elevated at 2.92.  Plan: -Continue to hold heparin and warfarin -INR daily  Height: 6' (182.9 cm) Weight: 215 lb 6.2 oz (97.7 kg) IBW/kg (Calculated) : 73.1  Temp (24hrs), Avg:97.7 F (36.5 C), Min:96.8 F (36 C), Max:98.9 F (37.2 C)   Recent Labs Lab 03/11/17 2115 03/11/17 2343  03/12/17 1030 03/12/17 1553 03/12/17 2021 03/13/17 0308 03/13/17 1544 03/14/17 0418 03/14/17 0635 03/14/17 0930 03/15/17 0410  WBC  --   --   --  23.4*  --   --  22.1*  --   --  25.2* 29.1* 27.7*  CREATININE  --   --   < >  --   --  2.31* 2.50* 2.51* 2.60*  --   --  2.59*  LATICACIDVEN 0.9 0.9  --  3.0* 1.5  --   --   --   --   --   --   --   < > = values in this interval not displayed.  Estimated Creatinine Clearance: 24.9 mL/min (A) (by C-G formula based on SCr of 2.59 mg/dL (H)).    No Known Allergies  Thank you for allowing pharmacy to be a part of this patient's care.  Arrie Senate, PharmD PGY-2 Cardiology Pharmacy Resident Pager: [phone] 03/15/2017

## 2017-03-15 NOTE — Progress Notes (Signed)
Progress Note  Patient Name: Charlotte [name] Date of Encounter: 03/15/2017  Primary Cardiologist: Dr Bronson Ing  Subjective   Intubated; opens eyes; no purposeful response  Inpatient Medications    Scheduled Meds: . aspirin  81 mg Per Tube Daily  . chlorhexidine gluconate (MEDLINE KIT)  15 mL Mouth Rinse BID  . Chlorhexidine Gluconate Cloth  6 each Topical Daily  . docusate  100 mg Per Tube Daily  . feeding supplement (PRO-STAT SUGAR FREE 64)  30 mL Per Tube TID  . fentaNYL (SUBLIMAZE) injection  50 mcg Intravenous Once  . gabapentin  100 mg Per Tube Q8H  . guaiFENesin  20 mL Per Tube TID  . hydroxyurea  1,000 mg Per Tube Daily  . insulin aspart  0-15 Units Subcutaneous Q4H  . levothyroxine  75 mcg Per NG tube QAC breakfast  . mouth rinse  15 mL Mouth Rinse 10 times per day  . pantoprazole (PROTONIX) IV  40 mg Intravenous Q24H  . polyethylene glycol  17 g Oral Daily  . pravastatin  10 mg Per Tube Daily  . senna-docusate  1 tablet Per Tube BID  . sodium chloride flush  10-40 mL Intracatheter Q12H   Continuous Infusions: . amiodarone 30 mg/hr (03/15/17 0600)  . cefTRIAXone (ROCEPHIN)  IV    . feeding supplement (VITAL AF 1.2 CAL) Stopped (03/15/17 0501)  . fentaNYL infusion INTRAVENOUS 50 mcg/hr (03/15/17 0600)  . furosemide Stopped (03/15/17 0327)  . norepinephrine (LEVOPHED) Adult infusion 15.04 mcg/min (03/15/17 0600)  . potassium chloride 10 mEq (03/15/17 0732)   PRN Meds: alum & mag hydroxide-simeth, fentaNYL, guaiFENesin-dextromethorphan, hydrALAZINE, labetalol, metoprolol tartrate, midazolam, ondansetron **OR** ondansetron (ZOFRAN) IV, phenol, sodium chloride flush   Vital Signs    Vitals:   03/15/17 0630 03/15/17 0645 03/15/17 0700 03/15/17 0715  BP:   105/61   Pulse: (!) 122 (!) 104 (!) 105 (!) 106  Resp: (!) 30 (!) 30 (!) 30 (!) 30  Temp:      TempSrc:      SpO2: 99% 99% 99% 99%  Weight:      Height:        Intake/Output Summary (Last 24 hours)  at 03/15/17 0744 Last data filed at 03/15/17 0600  Gross per 24 hour  Intake          2313.44 ml  Output             1725 ml  Net           588.44 ml   Filed Weights   03/13/17 0500 03/14/17 0500 03/15/17 0455  Weight: 90.5 kg (199 lb 8.3 oz) 98.4 kg (216 lb 14.9 oz) 97.7 kg (215 lb 6.2 oz)    Telemetry    Atrial fibrillation; rate elevated at times- Personally Reviewed   Physical Exam   GEN: WD, WN chronically ill appearing, intubated, not responsive, eyes open Neck: JVP 10 cm Cardiac: irregular, mildly tachycardic, no murmur Respiratory: CTA anteriorly; no rhonchi GI: Soft; not distended; no masses, no HSM MS: s/p RLE surgery, cool; blistering noted, 1+ edema Neuro:  not responsive; eyes open  Labs    Chemistry  Recent Labs Lab 03/13/17 0501 03/13/17 1544 03/14/17 0418 03/15/17 0410  NA  --  136 135 135  K  --  3.8 3.4* 3.1*  CL  --  110 108 109  CO2  --  14* 15* 14*  GLUCOSE  --  134* 140* 129*  BUN  --  49* 59* 69*  CREATININE  --  2.51* 2.60* 2.59*  CALCIUM  --  7.6* 7.6* 7.7*  PROT 4.7*  --  4.5* 4.4*  ALBUMIN 2.3*  --  1.9* 1.7*  AST 259*  --  150* 146*  ALT 146*  --  285* 236*  ALKPHOS 83  --  74 82  BILITOT 1.6*  --  1.5* 1.4*  GFRNONAA  --  18* 17* 17*  GFRAA  --  21* 20* 20*  ANIONGAP  --  '12 12 12     '$ Hematology  Recent Labs Lab 03/14/17 0635 03/14/17 0930 03/15/17 0410  WBC 25.2* 29.1* 27.7*  RBC 4.69 4.70 4.40  HGB 10.6* 10.8* 10.3*  HCT 34.9* 35.0* 32.1*  MCV 74.4* 74.5* 73.0*  MCH 22.6* 23.0* 23.4*  MCHC 30.4 30.9 32.1  RDW 25.7* 25.5* 25.6*  PLT 1,171* 1,134* 895*     Patient Profile     76 y.o. female Charlotte [name] a 76 y.o. femalewith past medical history of persistent atrial fibrillation (on Coumadin), chronic diastolic CHF, chronic respiratory failure (on 3L [place] at baseline),HTN, Stage 3 CKD, polycythemia vera, and PVD (s/p right femoral endarterectomy and femoral to above-knee popliteal bypass in 2014, known  superficial femoral artery occlusion and occlusion of fem-pop bypass by imaging in 08/2016) who was seen for preoperative cardiac clearanceat the request of Dr. Trula Slade. Nuclear study showed no ischemia or infarction. Pt had right common femoral to tibioperoneal trunk/peroneal bypass 8/8.   Assessment & Plan    1 permanent atrial fibrillation-Patient remains in permanent atrial fibrillation; hypotensive postoperatively; continue present dose of levophed and wean as tolerated. Continue IV amiodarone for rate control. Resume preadmission dose of cardizem and metoprolol later as BP allows. Continue heparin. Echo shows normal LV function. There is note of severe RV dysfunction.   2 Vascular disease-right foot remains cool and now with blistering. Vascular surgery following. May need AKA.  3 chronic diastolic congestive heart failure-patient remains volume overloaded. CVP 14. Continue aggressive diuresis and follow renal function.   4 acute on chronic stage III kidney disease-Renal function worse; may need nephrology eval. Will leave to CCM.  5 VDRF-management per CCM.  6 hypokalemia-supplement   Signed, Charlotte Ruths, MD  03/15/2017, 7:44 AM

## 2017-03-15 NOTE — Progress Notes (Signed)
eLink Physician-Brief Progress Note Patient Name: Charlotte [name] DOB: July 27, 1941 MRN: [id number]   Date of Service  03/15/2017  HPI/Events of Note  K 3.1  eICU Interventions  KCl 20 meq IV given     Intervention Category Major Interventions: Electrolyte abnormality - evaluation and management  YACOUB,WESAM 03/15/2017, 6:38 AM

## 2017-03-15 NOTE — Progress Notes (Signed)
VASCULAR LAB PRELIMINARY  ARTERIAL  ABI completed: Previous ABI's obtained on 08/06/16 demonstrated values of 0.71 on the right and 1.1 on the left. Right ABI of 0.74 is suggestive of moderate arterial occlusive disease at rest. Left ABI of 0.86 is suggestive of mild arterial occlusive disease at rest. Unable to obtain bilateral TBI's due to low amplitude waveforms.   RIGHT    LEFT    PRESSURE WAVEFORM  PRESSURE WAVEFORM  BRACHIAL 144 Triphasic BRACHIAL IV   DP 73 Biphasic DP 104 Biphasic  PT 106 Biphasic PT 124 Biphasic    RIGHT LEFT  ABI 0.74 0.86     Legrand Como, RVT 03/15/2017, 1:51 PM

## 2017-03-15 NOTE — Progress Notes (Signed)
ETT advanced 2cm per MD order.  Tube secured at 25 at lip.

## 2017-03-15 NOTE — Progress Notes (Signed)
OGT advanced 5cm per MD order

## 2017-03-15 NOTE — Progress Notes (Signed)
Pt vomited twice. Elink notified and tube feeding stopped. No further orders at this time. Will continue to monitor closely. Eleonore Chiquito RN 2 Heart

## 2017-03-15 NOTE — Progress Notes (Signed)
  Progress Note    03/15/2017 9:56 AM 4 Days Post-Op  Subjective:  Remains intubated and sedated  Vitals:   03/15/17 0800 03/15/17 0826  BP: 117/72 117/72  Pulse: (!) 120 (!) 126  Resp: (!) 30 (!) 30  Temp:    SpO2: 98% 100%    Physical Exam: Intubated and sedated, no neuro function evaluated RLE incisions are cdi with staples. Groin closed and in tact Strong right pt signal   CBC    Component Value Date/Time   WBC 27.7 (H) 03/15/2017 0410   RBC 4.40 03/15/2017 0410   HGB 10.3 (L) 03/15/2017 0410   HCT 32.1 (L) 03/15/2017 0410   PLT 895 (H) 03/15/2017 0410   MCV 73.0 (L) 03/15/2017 0410   MCH 23.4 (L) 03/15/2017 0410   MCHC 32.1 03/15/2017 0410   RDW 25.6 (H) 03/15/2017 0410   LYMPHSABS PENDING 03/15/2017 0410   MONOABS PENDING 03/15/2017 0410   EOSABS PENDING 03/15/2017 0410   BASOSABS PENDING 03/15/2017 0410    BMET    Component Value Date/Time   NA 135 03/15/2017 0410   K 3.1 (L) 03/15/2017 0410   CL 109 03/15/2017 0410   CO2 14 (L) 03/15/2017 0410   GLUCOSE 129 (H) 03/15/2017 0410   BUN 69 (H) 03/15/2017 0410   CREATININE 2.59 (H) 03/15/2017 0410   CREATININE 1.21 (H) 08/14/2016 0733   CALCIUM 7.7 (L) 03/15/2017 0410   GFRNONAA 17 (L) 03/15/2017 0410   GFRAA 20 (L) 03/15/2017 0410    INR    Component Value Date/Time   INR 2.92 03/15/2017 0410     Intake/Output Summary (Last 24 hours) at 03/15/17 0956 Last data filed at 03/15/17 0900  Gross per 24 hour  Intake          1899.34 ml  Output             2375 ml  Net          -475.66 ml     Assessment:  76 y.o. female is s/p R fem-tp trunk bypass with in situ vein, patent by doppler. Foot remains with purple discoloration but with brisk capillary refill  Plan: pccm managing cardiopulmonary issues Keep right foot warm.   Yuritzy Zehring C. [name], MD [facility] Office: [phone] Pager: [phone]  03/15/2017 9:56 AM

## 2017-03-15 NOTE — Progress Notes (Signed)
PULMONARY / CRITICAL CARE MEDICINE   Name: Charlotte [name] MRN: [id number] DOB: [birth year]    ADMISSION DATE:  03/04/2017 CONSULTATION DATE:  03/11/2017  REFERRING MD:  Windell Moulding  CHIEF COMPLAINT:  Leg pain  BRIEF:   76 y/o female with known Afib, polycythemia, and peripheral vascular disease and CHF on home O2 admitted on 8/1 with leg pain due to limb ischemia.  On 8/8 underwent R fem to tp trunk bypass, returned to the ICU intubated.    SUBJECTIVE:   Vomited overnight, NPO, NGT to LIWS. Remains on Levophed.    VITAL SIGNS: BP 117/72   Pulse (!) 126   Temp 98.9 F (37.2 C) (Oral)   Resp (!) 30   Ht 6' (1.829 m)   Wt 215 lb 6.2 oz (97.7 kg)   SpO2 100%   BMI 29.21 kg/m   HEMODYNAMICS:    VENTILATOR SETTINGS: Vent Mode: PRVC FiO2 (%):  [50 %-60 %] 50 % Set Rate:  [30 bmp] 30 bmp Vt Set:  [600 mL] 600 mL PEEP:  [10 cmH20] 10 cmH20 Plateau Pressure:  [24 cmH20-27 cmH20] 25 cmH20  INTAKE / OUTPUT: I/O last 3 completed shifts: In: 3726.4 [I.V.:1533.7; Other:10; NG/GT:2120.7; IV Piggyback:62] Out: 2010 [Urine:1310; Stool:700]  PHYSICAL EXAMINATION: General: ill appearing elderly female in NAD on vent  HEENT: MM pink/moist, ETT Neuro: opens eyes to voice, no follow commands, generalized weakness  CV: s1s2 rrr, no m/r/g PULM: even/non-labored, lungs bilaterally coarse anterior, diminished lower KG:YJEH, non-tender, BS sluggish , NGT to LIWS   Extremities: LLE wnl, RLE post-op bypass, cool, pulses + doppler, blue discoloration.  Incision c/d/i, R hand warm (radial aline site) Skin: no rashes or lesions   LABS:  BMET  Recent Labs Lab 03/13/17 1544 03/14/17 0418 03/15/17 0410  NA 136 135 135  K 3.8 3.4* 3.1*  CL 110 108 109  CO2 14* 15* 14*  BUN 49* 59* 69*  CREATININE 2.51* 2.60* 2.59*  GLUCOSE 134* 140* 129*    Electrolytes  Recent Labs Lab 03/12/17 1030 03/12/17 1553  03/13/17 0308 03/13/17 1544 03/14/17 0418 03/15/17 0410  CALCIUM  --    --   < > 7.8* 7.6* 7.6* 7.7*  MG 2.2 2.0  --  2.0  --   --   --   PHOS 5.3* 5.0*  --  5.4*  --   --   --   < > = values in this interval not displayed.  CBC  Recent Labs Lab 03/14/17 0635 03/14/17 0930 03/15/17 0410  WBC 25.2* 29.1* 27.7*  HGB 10.6* 10.8* 10.3*  HCT 34.9* 35.0* 32.1*  PLT 1,171* 1,134* 895*    Coag's  Recent Labs Lab 03/13/17 0308 03/14/17 0418 03/15/17 0410  INR 5.97* 5.49* 2.92    Sepsis Markers  Recent Labs Lab 03/11/17 2343  03/12/17 1030 03/12/17 1553 03/13/17 0308 03/14/17 0418  LATICACIDVEN 0.9  --  3.0* 1.5  --   --   PROCALCITON  --   < > 0.75  --  5.08 8.73  < > = values in this interval not displayed.  ABG  Recent Labs Lab 03/13/17 0955 03/13/17 1553 03/14/17 0424  PHART 7.341* 7.396 7.338*  PCO2ART 27.4* 23.7* 25.0*  PO2ART 58.2* 68.0* 73.0*    Liver Enzymes  Recent Labs Lab 03/13/17 0501 03/14/17 0418 03/15/17 0410  AST 259* 150* 146*  ALT 146* 285* 236*  ALKPHOS 83 74 82  BILITOT 1.6* 1.5* 1.4*  ALBUMIN 2.3* 1.9* 1.7*  Cardiac Enzymes  Recent Labs Lab 03/12/17 1030  TROPONINI 0.16*    Glucose  Recent Labs Lab 03/14/17 0905 03/14/17 1659 03/14/17 2006 03/15/17 0009 03/15/17 0358 03/15/17 0741  GLUCAP 136* 129* 122* 130* 126* 101*    Imaging Dg Chest Port 1 View  Result Date: 03/15/2017 CLINICAL DATA:  Acute respiratory failure. EXAM: PORTABLE CHEST 1 VIEW COMPARISON:  One-view chest x-ray the one-view chest x-ray 03/14/2017 FINDINGS: Endotracheal tube is stable, 5 cm above the carina. Side port of the NG tube is just above the GE junction and could be advanced for more optimal positioning. A left subclavian line is stable. Cardiac enlargement, mild edema, and bilateral effusions are again noted. Bibasilar airspace disease is present, likely reflecting atelectasis. There is no significant interval change. IMPRESSION: 1. Stable cardiomegaly, mild edema, and pleural effusions compatible with  congestive heart failure. 2. Support apparatus is stable. 3. The side port of the NG tube is likely just above the GE junction and could be advanced 6-10 cm for more optimal positioning. Electronically Signed   By: San Morelle M.D.   On: 03/15/2017 07:25     STUDIES:  NM Myocar 8/4 > No decreased activity in the left ventricle on stress imaging to suggest reversible ischemia or infarction. Normal left ventricular wall motion. No left ventricular dilation. Left Ventricular Ejection Fraction: 69 % US renal 8/6 > No evidence of hydronephrosis, mildly increased renal parenchymal echogenicity may reflect medical renal disease CXR 8/8 >  Moderate bilateral pleural effusions with associated atelectasis, pneumonia or a combination. No convincing pulmonary edema  CULTURES: MRSA by PCR 8/3 > Negative  Staph A. 8/3 > Negative  Sputum 8/3 >> moderate klebsiella pneumoniae >> S- rocephine  ANTIBIOTICS: Cefuroxime 8/8 >> 8/11 Rocephin 8/11 >>   SIGNIFICANT EVENTS: 8/01  Presents to ED  8/08  Underwent right lower extremity bypass  8/09  On neo overnight, oliguric, increased O2 needs, concern for worsening limb ischemia  LINES/TUBES: ETT 8/8 >> Right Radial 8/8 >>   DISCUSSION: 76 y/o chronically ill lady with critical R leg ischemia due to peripheral vascular disease, now with post operative respiratory failure presumably due to acute pulmonary edema in the setting of known diastolic CHF and poorly controlled heart rate from Afib.   ASSESSMENT / PLAN:  PULMONARY A: Acute on Chronic Hypoxic Respiratory Failure  Bilateral Pleural Effusions  Acute Pulmonary Edema  P:   PRVC 8 cc/kg  Wean PEEP / FiO2 for sats > 92% VAP prevention measures  Intermittent CXR Daily WUA / SBT   CARDIOVASCULAR A:  Afib with RVR Acute diastolic heart failure Shock,  P:  ICU monitoring  Follow CVP Continue amiodarone Wean pressors for MAP >65 IV Lasix   RENAL A:   Hypokalemia Acute on  chronic renal failure Metabolic Acidosis  P:   Trend BMP / urinary output Replace electrolytes as indicated Avoid nephrotoxic agents, ensure adequate renal perfusion May need Nephrology input pending sr cr trend / acidosis > no emergent HD needs at this time    GASTROINTESTINAL A:   Elevated LFT's - ? Shock Liver  Vomiting 8/12  P:   PPI for SUP  Continue TF per Nutrition -on hold 8/12  Trend LFT's Check KUB.   HEMATOLOGIC A:   Polycythemia Vera History of DVT on Coumadin -INR tr down  P:  Heparin gtt on hold with INR >2.9 , check INR in am .  Heparin per pharmacy -on hold for now .  Trend CBC  INFECTIOUS A:   Ischemic R foot  Klebsiella in Sputum - difficult to discern infiltrate with effusions P:   Narrow abx to Rocephin, D5/x vanco off 8/11.  Trend PCT   ENDOCRINE A:   Hypothyroidism   P:   Synthroid  Follow glucose  NEUROLOGIC A:   Sedation needs for vent synchrony P:   RASS goal: 0 to -1  Fentanyl infusion per PAD protocol    FAMILY  - Updates:  No family at bedside am 8/11.     Eldon Zietlow NP-C  [place] [facility]  [phone]   03/15/2017, 10:23 AM

## 2017-03-15 NOTE — Progress Notes (Signed)
PT Cancellation Note  Patient Details Name: Charlotte [name] MRN: [id number] DOB: 02/11/1941   Cancelled Treatment:    Reason Eval/Treat Not Completed: Patient not medically ready   Remains intubated and sedated; PT will continue to f/u with pt as available and appropriate.  Thank you,  Roney Marion, PT  Acute Rehabilitation Services Pager [phone] Office [phone]    [name] 03/15/2017, 9:43 AM

## 2017-03-16 ENCOUNTER — Inpatient Hospital Stay (HOSPITAL_COMMUNITY): Payer: Medicare Other

## 2017-03-16 DIAGNOSIS — I50811 Acute right heart failure: Secondary | ICD-10-CM

## 2017-03-16 DIAGNOSIS — N179 Acute kidney failure, unspecified: Secondary | ICD-10-CM

## 2017-03-16 LAB — LIPASE, BLOOD: LIPASE: 43 U/L (ref 11–51)

## 2017-03-16 LAB — PROTIME-INR
INR: 3.33
Prothrombin Time: 34.6 seconds — ABNORMAL HIGH (ref 11.4–15.2)

## 2017-03-16 LAB — GLUCOSE, CAPILLARY
GLUCOSE-CAPILLARY: 102 mg/dL — AB (ref 65–99)
GLUCOSE-CAPILLARY: 105 mg/dL — AB (ref 65–99)
GLUCOSE-CAPILLARY: 108 mg/dL — AB (ref 65–99)
GLUCOSE-CAPILLARY: 131 mg/dL — AB (ref 65–99)
GLUCOSE-CAPILLARY: 110 mg/dL — AB (ref 65–99)
Glucose-Capillary: 112 mg/dL — ABNORMAL HIGH (ref 65–99)
Glucose-Capillary: 110 mg/dL — ABNORMAL HIGH (ref 65–99)

## 2017-03-16 LAB — COMPREHENSIVE METABOLIC PANEL
ALK PHOS: 91 U/L (ref 38–126)
ALT: 272 U/L — ABNORMAL HIGH (ref 14–54)
ANION GAP: 13 (ref 5–15)
AST: 183 U/L — ABNORMAL HIGH (ref 15–41)
Albumin: 1.7 g/dL — ABNORMAL LOW (ref 3.5–5.0)
BILIRUBIN TOTAL: 1.2 mg/dL (ref 0.3–1.2)
BUN: 74 mg/dL — ABNORMAL HIGH (ref 6–20)
CALCIUM: 8 mg/dL — AB (ref 8.9–10.3)
CO2: 16 mmol/L — ABNORMAL LOW (ref 22–32)
Chloride: 107 mmol/L (ref 101–111)
Creatinine, Ser: 2.32 mg/dL — ABNORMAL HIGH (ref 0.44–1.00)
GFR, EST AFRICAN AMERICAN: 23 mL/min — AB (ref >60)
GFR, EST NON AFRICAN AMERICAN: 20 mL/min — AB (ref >60)
Glucose, Bld: 120 mg/dL — ABNORMAL HIGH (ref 65–99)
Potassium: 3.1 mmol/L — ABNORMAL LOW (ref 3.5–5.1)
Sodium: 136 mmol/L (ref 135–145)
TOTAL PROTEIN: 4.4 g/dL — AB (ref 6.5–8.1)

## 2017-03-16 LAB — POCT I-STAT 3, ART BLOOD GAS (G3+)
Acid-base deficit: 6 mmol/L — ABNORMAL HIGH (ref 0.0–2.0)
Bicarbonate: 16.3 mmol/L — ABNORMAL LOW (ref 20.0–28.0)
O2 SAT: 99 %
PCO2 ART: 24.1 mmHg — AB (ref 32.0–48.0)
PH ART: 7.438 (ref 7.350–7.450)
PO2 ART: 125 mmHg — AB (ref 83.0–108.0)
Patient temperature: 98.6
TCO2: 17 mmol/L (ref 0–100)

## 2017-03-16 LAB — CBC WITH DIFFERENTIAL/PLATELET
BASOS ABS: 0 10*3/uL (ref 0.0–0.1)
Basophils Relative: 0 %
EOS ABS: 0 10*3/uL (ref 0.0–0.7)
Eosinophils Relative: 0 %
HCT: 31.3 % — ABNORMAL LOW (ref 36.0–46.0)
Hemoglobin: 10.3 g/dL — ABNORMAL LOW (ref 12.0–15.0)
Lymphocytes Relative: 3 %
Lymphs Abs: 0.8 10*3/uL (ref 0.7–4.0)
MCH: 23.3 pg — AB (ref 26.0–34.0)
MCHC: 32.9 g/dL (ref 30.0–36.0)
MCV: 70.7 fL — ABNORMAL LOW (ref 78.0–100.0)
Monocytes Absolute: 2.2 10*3/uL — ABNORMAL HIGH (ref 0.1–1.0)
Monocytes Relative: 8 %
NEUTROS ABS: 24.1 10*3/uL — AB (ref 1.7–7.7)
NEUTROS PCT: 89 %
PLATELETS: 677 10*3/uL — AB (ref 150–400)
RBC: 4.43 MIL/uL (ref 3.87–5.11)
RDW: 25.5 % — ABNORMAL HIGH (ref 11.5–15.5)
WBC: 27.1 10*3/uL — AB (ref 4.0–10.5)

## 2017-03-16 LAB — PHOSPHORUS: Phosphorus: 3.7 mg/dL (ref 2.5–4.6)

## 2017-03-16 LAB — MAGNESIUM: Magnesium: 1.7 mg/dL (ref 1.7–2.4)

## 2017-03-16 LAB — AMYLASE: Amylase: 102 U/L — ABNORMAL HIGH (ref 28–100)

## 2017-03-16 MED ORDER — VANCOMYCIN HCL 10 G IV SOLR
2000.0000 mg | Freq: Once | INTRAVENOUS | Status: AC
  Administered 2017-03-16: 2000 mg via INTRAVENOUS
  Filled 2017-03-16: qty 2000

## 2017-03-16 MED ORDER — SODIUM CHLORIDE 0.9 % IV SOLN
0.4000 ug/kg/h | INTRAVENOUS | Status: DC
  Administered 2017-03-16: 0.2 ug/kg/h via INTRAVENOUS
  Filled 2017-03-16: qty 2

## 2017-03-16 MED ORDER — CIPROFLOXACIN IN D5W 400 MG/200ML IV SOLN
400.0000 mg | INTRAVENOUS | Status: DC
  Administered 2017-03-16 – 2017-03-21 (×6): 400 mg via INTRAVENOUS
  Filled 2017-03-16 (×6): qty 200

## 2017-03-16 MED ORDER — POTASSIUM CHLORIDE 10 MEQ/50ML IV SOLN
10.0000 meq | INTRAVENOUS | Status: AC
  Administered 2017-03-16 (×3): 10 meq via INTRAVENOUS
  Filled 2017-03-16 (×3): qty 50

## 2017-03-16 MED ORDER — VANCOMYCIN HCL IN DEXTROSE 750-5 MG/150ML-% IV SOLN
750.0000 mg | INTRAVENOUS | Status: DC
  Administered 2017-03-17 – 2017-03-18 (×2): 750 mg via INTRAVENOUS
  Filled 2017-03-16 (×5): qty 150

## 2017-03-16 NOTE — Progress Notes (Addendum)
DAILY PROGRESS NOTE   Patient Name: Charlotte [name] Date of Encounter: 03/16/2017  Hospital Problem List   Principal Problem:   Lower extremity arterial insufficiency, severe, right ([place]) Active Problems:   Polycythemia vera ([place])   Chronic atrial fibrillation (HCC)   HTN (hypertension)   Hypothyroidism   Peripheral vascular disease (HCC)   Thrombocytosis (HCC)   Hyperlipidemia   Acute renal failure (ARF) ([place])   Chief Complaint   Sedated, intubated on vent  Subjective   Mrs. Shrestha remains intubated, but requiring lower PEEP. In permanent a-fib, but on amiodarone for rate control. Significant right leg ischemia. Vascular surgery following - however, ABI's only mild to moderately decreased at 0.74R and 0.86L. She has diuresed about 1.8L negative. BP stable on neosynephrine.  Objective   Vitals:   03/16/17 0645 03/16/17 0700 03/16/17 0715 03/16/17 0722  BP: 98/67 136/84 124/78 124/78  Pulse: 77 97 (!) 101 (!) 108  Resp: (!) 30 (!) 30 (!) 30 (!) 30  Temp:      TempSrc:      SpO2: 100% 100% 100% 100%  Weight:      Height:        Intake/Output Summary (Last 24 hours) at 03/16/17 0844 Last data filed at 03/16/17 0656  Gross per 24 hour  Intake           948.44 ml  Output             2245 ml  Net         -1296.56 ml   Filed Weights   03/14/17 0500 03/15/17 0455 03/16/17 0505  Weight: 216 lb 14.9 oz (98.4 kg) 215 lb 6.2 oz (97.7 kg) 208 lb 1.8 oz (94.4 kg)    Physical Exam   General appearance: no distress and intubated, opens eyes to voice, lightly sedated Neck: no carotid bruit, no JVD and thyroid not enlarged, symmetric, no tenderness/mass/nodules Lungs: diminished breath sounds bilaterally Heart: irregularly irregular rhythm Abdomen: soft, non-tender; bowel sounds normal; no masses,  no organomegaly Extremities: right lower extremity cool and somewhat cyanotic Pulses: decreased pulses to the right lower extremity Skin: skin weeping, cyanosis of the  RLE Neurologic: Mental status: Intubated, opens eyes to voice Psych: Unable to assess  Inpatient Medications    Scheduled Meds: . aspirin  81 mg Per Tube Daily  . chlorhexidine gluconate (MEDLINE KIT)  15 mL Mouth Rinse BID  . Chlorhexidine Gluconate Cloth  6 each Topical Daily  . docusate  100 mg Per Tube Daily  . feeding supplement (PRO-STAT SUGAR FREE 64)  30 mL Per Tube TID  . fentaNYL (SUBLIMAZE) injection  50 mcg Intravenous Once  . gabapentin  100 mg Per Tube Q8H  . guaiFENesin  20 mL Per Tube TID  . hydroxyurea  1,000 mg Per Tube Daily  . insulin aspart  0-15 Units Subcutaneous Q4H  . levothyroxine  75 mcg Per NG tube QAC breakfast  . mouth rinse  15 mL Mouth Rinse 10 times per day  . pantoprazole (PROTONIX) IV  40 mg Intravenous Q24H  . polyethylene glycol  17 g Oral Daily  . sennosides  5 mL Per Tube BID  . sodium chloride flush  10-40 mL Intracatheter Q12H    Continuous Infusions: . amiodarone 30 mg/hr (03/16/17 0600)  . feeding supplement (VITAL AF 1.2 CAL) Stopped (03/15/17 0501)  . fentaNYL infusion INTRAVENOUS 200 mcg/hr (03/16/17 0656)  . furosemide Stopped (03/16/17 0359)  . norepinephrine (LEVOPHED) Adult infusion 5 mcg/min (03/16/17  0721)  . potassium chloride 10 mEq (03/16/17 0752)    PRN Meds: alum & mag hydroxide-simeth, fentaNYL, guaiFENesin-dextromethorphan, hydrALAZINE, labetalol, metoprolol tartrate, midazolam, ondansetron **OR** ondansetron (ZOFRAN) IV, phenol, sodium chloride flush   Labs   Results for orders placed or performed during the hospital encounter of 03/04/17 (from the past 48 hour(s))  Glucose, capillary     Status: Abnormal   Collection Time: 03/14/17  9:05 AM  Result Value Ref Range   Glucose-Capillary 136 (H) 65 - 99 mg/dL   Comment 1 Arterial Specimen   CBC     Status: Abnormal   Collection Time: 03/14/17  9:30 AM  Result Value Ref Range   WBC 29.1 (H) 4.0 - 10.5 K/uL   RBC 4.70 3.87 - 5.11 MIL/uL   Hemoglobin 10.8 (L) 12.0  - 15.0 g/dL   HCT 35.0 (L) 36.0 - 46.0 %   MCV 74.5 (L) 78.0 - 100.0 fL   MCH 23.0 (L) 26.0 - 34.0 pg   MCHC 30.9 30.0 - 36.0 g/dL   RDW 25.5 (H) 11.5 - 15.5 %   Platelets 1,134 (HH) 150 - 400 K/uL    Comment: REPEATED TO VERIFY CRITICAL VALUE NOTED.  VALUE IS CONSISTENT WITH PREVIOUSLY REPORTED AND CALLED VALUE.   Provider-confirm verbal Blood Bank order - RBC; 4 Units; Order taken: 03/11/2017; 2:58 PM; Surgery Documenting verbal order to Blood Bank for 4 units RBC for surgery. 4 units issued, 2 transfused     Status: None   Collection Time: 03/14/17  3:30 PM  Result Value Ref Range   Blood product order confirm MD AUTHORIZATION REQUESTED   Glucose, capillary     Status: Abnormal   Collection Time: 03/14/17  4:59 PM  Result Value Ref Range   Glucose-Capillary 129 (H) 65 - 99 mg/dL   Comment 1 Arterial Specimen   Glucose, capillary     Status: Abnormal   Collection Time: 03/14/17  8:06 PM  Result Value Ref Range   Glucose-Capillary 122 (H) 65 - 99 mg/dL   Comment 1 Notify RN   Glucose, capillary     Status: Abnormal   Collection Time: 03/15/17 12:09 AM  Result Value Ref Range   Glucose-Capillary 130 (H) 65 - 99 mg/dL   Comment 1 Notify RN   Glucose, capillary     Status: Abnormal   Collection Time: 03/15/17  3:58 AM  Result Value Ref Range   Glucose-Capillary 126 (H) 65 - 99 mg/dL   Comment 1 Notify RN   Protime-INR     Status: Abnormal   Collection Time: 03/15/17  4:10 AM  Result Value Ref Range   Prothrombin Time 31.1 (H) 11.4 - 15.2 seconds   INR 2.92   CBC with Differential/Platelet     Status: Abnormal   Collection Time: 03/15/17  4:10 AM  Result Value Ref Range   WBC 27.7 (H) 4.0 - 10.5 K/uL    Comment: WHITE COUNT CONFIRMED ON SMEAR   RBC 4.40 3.87 - 5.11 MIL/uL   Hemoglobin 10.3 (L) 12.0 - 15.0 g/dL   HCT 32.1 (L) 36.0 - 46.0 %   MCV 73.0 (L) 78.0 - 100.0 fL   MCH 23.4 (L) 26.0 - 34.0 pg   MCHC 32.1 30.0 - 36.0 g/dL   RDW 25.6 (H) 11.5 - 15.5 %   Platelets 895  (H) 150 - 400 K/uL    Comment: PLATELET COUNT CONFIRMED BY SMEAR   Neutrophils Relative % 87 %   Lymphocytes Relative 3 %  Monocytes Relative 8 %   Eosinophils Relative 1 %   Basophils Relative 1 %   RBC Morphology BURR CELLS     Comment: ELLIPTOCYTES POLYCHROMASIA PRESENT RARE NRBCs    WBC Morphology MILD LEFT SHIFT (1-5% METAS, OCC MYELO, OCC BANDS)    Neutro Abs 24.1 (H) 1.7 - 7.7 K/uL   Lymphs Abs 0.8 0.7 - 4.0 K/uL   Monocytes Absolute 2.2 (H) 0.1 - 1.0 K/uL   Eosinophils Absolute 0.3 0.0 - 0.7 K/uL   Basophils Absolute 0.3 (H) 0.0 - 0.1 K/uL  Basic metabolic panel     Status: Abnormal   Collection Time: 03/15/17  4:10 AM  Result Value Ref Range   Sodium 135 135 - 145 mmol/L   Potassium 3.1 (L) 3.5 - 5.1 mmol/L   Chloride 109 101 - 111 mmol/L   CO2 14 (L) 22 - 32 mmol/L   Glucose, Bld 129 (H) 65 - 99 mg/dL   BUN 69 (H) 6 - 20 mg/dL   Creatinine, Ser 2.59 (H) 0.44 - 1.00 mg/dL   Calcium 7.7 (L) 8.9 - 10.3 mg/dL   GFR calc non Af Amer 17 (L) >60 mL/min   GFR calc Af Amer 20 (L) >60 mL/min    Comment: (NOTE) The eGFR has been calculated using the CKD EPI equation. This calculation has not been validated in all clinical situations. eGFR's persistently <60 mL/min signify possible Chronic Kidney Disease.    Anion gap 12 5 - 15  Hepatic function panel     Status: Abnormal   Collection Time: 03/15/17  4:10 AM  Result Value Ref Range   Total Protein 4.4 (L) 6.5 - 8.1 g/dL   Albumin 1.7 (L) 3.5 - 5.0 g/dL   AST 146 (H) 15 - 41 U/L   ALT 236 (H) 14 - 54 U/L   Alkaline Phosphatase 82 38 - 126 U/L   Total Bilirubin 1.4 (H) 0.3 - 1.2 mg/dL   Bilirubin, Direct 0.4 0.1 - 0.5 mg/dL   Indirect Bilirubin 1.0 (H) 0.3 - 0.9 mg/dL  Glucose, capillary     Status: Abnormal   Collection Time: 03/15/17  7:41 AM  Result Value Ref Range   Glucose-Capillary 101 (H) 65 - 99 mg/dL  Glucose, capillary     Status: Abnormal   Collection Time: 03/15/17 11:34 AM  Result Value Ref Range    Glucose-Capillary 117 (H) 65 - 99 mg/dL   Comment 1 Arterial Specimen   Glucose, capillary     Status: Abnormal   Collection Time: 03/15/17 12:57 PM  Result Value Ref Range   Glucose-Capillary 112 (H) 65 - 99 mg/dL  Glucose, capillary     Status: Abnormal   Collection Time: 03/15/17  4:01 PM  Result Value Ref Range   Glucose-Capillary 118 (H) 65 - 99 mg/dL   Comment 1 Notify RN   Glucose, capillary     Status: Abnormal   Collection Time: 03/15/17 11:48 PM  Result Value Ref Range   Glucose-Capillary 111 (H) 65 - 99 mg/dL   Comment 1 Notify RN   Comprehensive metabolic panel     Status: Abnormal   Collection Time: 03/16/17  3:56 AM  Result Value Ref Range   Sodium 136 135 - 145 mmol/L   Potassium 3.1 (L) 3.5 - 5.1 mmol/L   Chloride 107 101 - 111 mmol/L   CO2 16 (L) 22 - 32 mmol/L   Glucose, Bld 120 (H) 65 - 99 mg/dL   BUN 74 (H) 6 -  20 mg/dL   Creatinine, Ser 2.32 (H) 0.44 - 1.00 mg/dL   Calcium 8.0 (L) 8.9 - 10.3 mg/dL   Total Protein 4.4 (L) 6.5 - 8.1 g/dL   Albumin 1.7 (L) 3.5 - 5.0 g/dL   AST 183 (H) 15 - 41 U/L   ALT 272 (H) 14 - 54 U/L   Alkaline Phosphatase 91 38 - 126 U/L   Total Bilirubin 1.2 0.3 - 1.2 mg/dL   GFR calc non Af Amer 20 (L) >60 mL/min   GFR calc Af Amer 23 (L) >60 mL/min    Comment: (NOTE) The eGFR has been calculated using the CKD EPI equation. This calculation has not been validated in all clinical situations. eGFR's persistently <60 mL/min signify possible Chronic Kidney Disease.    Anion gap 13 5 - 15  CBC with Differential/Platelet     Status: Abnormal   Collection Time: 03/16/17  3:56 AM  Result Value Ref Range   WBC 27.1 (H) 4.0 - 10.5 K/uL    Comment: REPEATED TO VERIFY WHITE COUNT CONFIRMED ON SMEAR    RBC 4.43 3.87 - 5.11 MIL/uL   Hemoglobin 10.3 (L) 12.0 - 15.0 g/dL   HCT 31.3 (L) 36.0 - 46.0 %   MCV 70.7 (L) 78.0 - 100.0 fL   MCH 23.3 (L) 26.0 - 34.0 pg   MCHC 32.9 30.0 - 36.0 g/dL   RDW 25.5 (H) 11.5 - 15.5 %   Platelets 677  (H) 150 - 400 K/uL    Comment: REPEATED TO VERIFY PLATELET COUNT CONFIRMED BY SMEAR    Neutrophils Relative % 89 %   Lymphocytes Relative 3 %   Monocytes Relative 8 %   Eosinophils Relative 0 %   Basophils Relative 0 %   Neutro Abs 24.1 (H) 1.7 - 7.7 K/uL   Lymphs Abs 0.8 0.7 - 4.0 K/uL   Monocytes Absolute 2.2 (H) 0.1 - 1.0 K/uL   Eosinophils Absolute 0.0 0.0 - 0.7 K/uL   Basophils Absolute 0.0 0.0 - 0.1 K/uL   RBC Morphology POLYCHROMASIA PRESENT     Comment: BURR CELLS ELLIPTOCYTES RARE NRBCs SPHEROCYTES    WBC Morphology HYPERSEGMENTED NEUT     Comment: TOXIC GRANULATION PYKNOTIC NUCLEI   Magnesium     Status: None   Collection Time: 03/16/17  3:56 AM  Result Value Ref Range   Magnesium 1.7 1.7 - 2.4 mg/dL  Phosphorus     Status: None   Collection Time: 03/16/17  3:56 AM  Result Value Ref Range   Phosphorus 3.7 2.5 - 4.6 mg/dL  Protime-INR     Status: Abnormal   Collection Time: 03/16/17  3:56 AM  Result Value Ref Range   Prothrombin Time 34.6 (H) 11.4 - 15.2 seconds   INR 3.33   Glucose, capillary     Status: Abnormal   Collection Time: 03/16/17  4:04 AM  Result Value Ref Range   Glucose-Capillary 105 (H) 65 - 99 mg/dL   Comment 1 Notify RN     ECG   N/A - Personally Reviewed  Telemetry   A-fib with CVR - Personally Reviewed  Radiology    Dg Abd 1 View  Result Date: 03/15/2017 CLINICAL DATA:  Adjustment of abdominal device EXAM: ABDOMEN - 1 VIEW COMPARISON:  03/15/2017 FINDINGS: NG tube advanced with the tip in the body the stomach. Normal bowel gas pattern. IMPRESSION: NG tube in the body the stomach. Electronically Signed   By: Franchot Gallo M.D.   On: 03/15/2017 19:37  Dg Chest Port 1 View  Result Date: 03/15/2017 CLINICAL DATA:  ETT advanced 2 cm EXAM: PORTABLE CHEST 1 VIEW COMPARISON:  03/15/2017 FINDINGS: Endotracheal tube 2 cm above the carina.  Central line in the SVC Bilateral pleural effusions and bibasilar atelectasis mildly improved.  IMPRESSION: Endotracheal tube now 2 cm above the carina Bilateral pleural effusions and bibasilar atelectasis show mild interval improvement. Electronically Signed   By: Franchot Gallo M.D.   On: 03/15/2017 19:35   Dg Chest Port 1 View  Result Date: 03/15/2017 CLINICAL DATA:  Acute respiratory failure. EXAM: PORTABLE CHEST 1 VIEW COMPARISON:  One-view chest x-ray 6/the one-view chest x-ray 03/14/2017 FINDINGS: Endotracheal tube is stable, 5 cm above the carina. Side port of the NG tube is just above the GE junction and could be advanced for more optimal positioning. A left subclavian line is stable. Cardiac enlargement, mild edema, and bilateral effusions are again noted. Bibasilar airspace disease is present, likely reflecting atelectasis. There is no significant interval change. IMPRESSION: 1. Stable cardiomegaly, mild edema, and pleural effusions compatible with congestive heart failure. 2. Support apparatus is stable. 3. The side port of the NG tube is likely just above the GE junction and could be advanced 6-10 cm for more optimal positioning. Electronically Signed   By: San Morelle M.D.   On: 03/15/2017 07:25   Dg Abd Portable 1v  Result Date: 03/15/2017 CLINICAL DATA:  Increased vomiting. EXAM: PORTABLE ABDOMEN - 1 VIEW COMPARISON:  03/11/2017. FINDINGS: Paucity of intestinal gas with no visible dilated bowel loops. No significant change in bilateral pleural effusions. Enlarged cardiac silhouette. Curvilinear density in the inferior pelvis in the expected position of the urinary bladder. Right hip fixation screws. Atheromatous arterial calcifications. Nasogastric tube tip in the proximal stomach and side hole in the region of the gastroesophageal junction. IMPRESSION: 1. Nasogastric tube tip in the proximal stomach and side hole in the region of the gastroesophageal junction. 2. Curvilinear density in the inferior pelvis in the expected position of the urinary bladder, most likely  representing a Foley catheter. A rectal tube is also a possibility. 3. Cardiomegaly and bilateral pleural effusions. Electronically Signed   By: Claudie Revering M.D.   On: 03/15/2017 11:13    Cardiac Studies   LV EF: 65% -   70%  ------------------------------------------------------------------- Indications:      Atrial fibrillation - 427.31.  ------------------------------------------------------------------- History:   PMH:  Chronic Kidney Failure. Shock unknown etiology. Right foot Ischemia. Peripheral Vascular Disease. Bilateral Pleural Effusions.  Congestive heart failure.  ------------------------------------------------------------------- Study Conclusions  - Left ventricle: The cavity size was normal. Systolic function was   vigorous. The estimated ejection fraction was in the range of 65%   to 70%. Wall motion was normal; there were no regional wall   motion abnormalities. The study was not technically sufficient to   allow evaluation of LV diastolic dysfunction due to atrial   fibrillation. - Ventricular septum: The contour showed diastolic flattening and   systolic flattening. - Mitral valve: Calcified annulus. Mildly thickened leaflets .   There was mild regurgitation. - Left atrium: The atrium was severely dilated. - Right ventricle: The cavity size was moderately dilated. Wall   thickness was normal. Systolic function was moderately to   severely reduced. - Right atrium: The atrium was severely dilated. - Tricuspid valve: There was moderate regurgitation. - Pulmonic valve: There was no regurgitation. - Pulmonary arteries: Systolic pressure was moderately increased.   PA peak pressure: 59 mm Hg (S). -  Pericardium, extracardiac: A trivial pericardial effusion was   identified. Features were not consistent with tamponade   physiology. There was a large left pleural effusion.  Assessment   1. Principal Problem: 2.   Lower extremity arterial insufficiency,  severe, right ([place]) 3. Active Problems: 4.   Polycythemia vera ([place]) 5.   Chronic atrial fibrillation (HCC) 6.   HTN (hypertension) 7.   Hypothyroidism 8.   Peripheral vascular disease ([place]) 9.   Thrombocytosis ([place]) 10.   Hyperlipidemia 11.   Acute renal failure (ARF) ([place])  Plan   Remains critically ill, but stable. A-fib rate-controlled on IV amiodarone. Net negative fluid balance. Echo on 8/10 shows LVEF of 65-70% with severe biatrial enlargement, moderate RVE and moderate to severely reduced RV systolic function. Continue IV amiodarone, IV lasix TID - creatinine improving. BNP 1374 on 8/8. CXR yesterday showed mild interval improvement in bilateral pleural effusions.  Time Spent Directly with Patient:  I have spent a total of 25 minutes with the patient reviewing hospital notes, telemetry, EKGs, labs and examining the patient as well as establishing an assessment and plan that was discussed personally with the patient. > 50% of time was spent in direct patient care.  Length of Stay:  LOS: 12 days   Pixie Casino, MD, [place]  Attending Cardiologist  Direct Dial: [phone]  Fax: [phone]  Website:  [email or web address] 03/16/2017, 8:44 AM

## 2017-03-16 NOTE — Progress Notes (Addendum)
PT Cancellation Note  Patient Details Name: Charlotte [name] MRN: [id number] DOB: May 27, 1941   Cancelled Treatment:    Reason Eval/Treat Not Completed: Medical issues which prohibited therapy (Pt still intubated with plan to extubate this pm. ) Nursing asked PT to check back tomorrow. Thanks.   [place] 03/16/2017, 11:42 AM [facility] [phone] (pager)

## 2017-03-16 NOTE — Progress Notes (Signed)
  Progress Note    03/16/2017 12:59 PM 5 Days Post-Op  Subjective:  Intubated and unresponsive  Vitals:   03/16/17 1229 03/16/17 1230  BP:  108/65  Pulse:  (!) 39  Resp:  20  Temp:    SpO2: 100% 100%    Physical Exam: Intubated and sedated RLE incisions are cdi with staples. Groin closed and in tact Strong right pt signal R foot has brisk capillary refill   CBC    Component Value Date/Time   WBC 27.1 (H) 03/16/2017 0356   RBC 4.43 03/16/2017 0356   HGB 10.3 (L) 03/16/2017 0356   HCT 31.3 (L) 03/16/2017 0356   PLT 677 (H) 03/16/2017 0356   MCV 70.7 (L) 03/16/2017 0356   MCH 23.3 (L) 03/16/2017 0356   MCHC 32.9 03/16/2017 0356   RDW 25.5 (H) 03/16/2017 0356   LYMPHSABS 0.8 03/16/2017 0356   MONOABS 2.2 (H) 03/16/2017 0356   EOSABS 0.0 03/16/2017 0356   BASOSABS 0.0 03/16/2017 0356    BMET    Component Value Date/Time   NA 136 03/16/2017 0356   K 3.1 (L) 03/16/2017 0356   CL 107 03/16/2017 0356   CO2 16 (L) 03/16/2017 0356   GLUCOSE 120 (H) 03/16/2017 0356   BUN 74 (H) 03/16/2017 0356   CREATININE 2.32 (H) 03/16/2017 0356   CREATININE 1.21 (H) 08/14/2016 0733   CALCIUM 8.0 (L) 03/16/2017 0356   GFRNONAA 20 (L) 03/16/2017 0356   GFRAA 23 (L) 03/16/2017 0356    INR    Component Value Date/Time   INR 3.33 03/16/2017 0356     Intake/Output Summary (Last 24 hours) at 03/16/17 1259 Last data filed at 03/16/17 1200  Gross per 24 hour  Intake          1555.61 ml  Output             2675 ml  Net         -1119.39 ml     Assessment: 76 y.o.femaleis s/p R fem-tp trunk bypass with in situ vein, patent by doppler. Bypass patent by physical exam and ABI's  Plan: pccm managing cardiopulmonary issues Keep incisions clean and dry and right foot warm.    Kamarie Veno C. [name], MD [facility] Office: [phone] Pager: [phone]  03/16/2017 12:59 PM

## 2017-03-16 NOTE — Progress Notes (Signed)
03/16/2017 1300 Pt. Noted SBP in 60's. Precedex and Fentanyl paused at this time. Levophed titrated up per prior orders to maintain BP. Pt. arousable and agitated upon arousal. Dr. Ashok Cordia paged and made aware. Verbal order received to keep precedex on hold for now. Orders enacted. 1320: Pt. BP stable at 110/75. Will continue to closely monitor patient.  Ladavia Lindenbaum, [place] Lime

## 2017-03-16 NOTE — Plan of Care (Signed)
Problem: Activity: Goal: Ability to tolerate increased activity will improve Outcome: Not Progressing Pt remains intubated and sedated  Problem: Coping: Goal: Level of anxiety will decrease Outcome: Not Progressing Requiring increased sedation  Problem: Nutritional: Goal: Intake of prescribed amount of daily calories will improve Outcome: Not Progressing Holding tube feeds due to emesis on 8/12

## 2017-03-16 NOTE — Progress Notes (Signed)
OT Cancellation Note  Patient Details Name: DESTYNI HOPPEL MRN: [id number] DOB: 1941-07-31   Cancelled Treatment:    Reason Eval/Treat Not Completed: Medical issues which prohibited therapy.  Will reattempt.  [place], OTR/L [phone]   [name] 03/16/2017, 11:03 AM

## 2017-03-16 NOTE — Progress Notes (Signed)
eLink Physician-Brief Progress Note Patient Name: Charlotte [name] DOB: [birth year] MRN: [id number]   Date of Service  03/16/2017  HPI/Events of Note  Hypothermia - Temp = 95 F.   eICU Interventions  Will order [facility].      Intervention Category Major Interventions: Other:  Lysle Dingwall 03/16/2017, 10:12 PM

## 2017-03-16 NOTE — Progress Notes (Addendum)
PULMONARY / CRITICAL CARE MEDICINE   Name: Charlotte [name] MRN: [id number] DOB: 1941-02-16    ADMISSION DATE:  03/04/2017   CONSULTATION DATE:  03/11/2017  REFERRING MD:  Windell Moulding  CHIEF COMPLAINT:  Leg pain  BRIEF HPI:  76 y.o.   female with known atrial fibrillation, polycythemia, and peripheral vascular disease. Patient also has known congestive heart failure and is on chronic oxygen therapy. Admitted on 8/1 with limb ischemia and subsequently pain. Underwent bypass on 8/8 and return to the intensive care unit intubated.  SUBJECTIVE:  Patient weaning PEEP today. Has not done spontaneous breathing trial yet.  REVIEW OF SYSTEMS:  Unable to obtain secondary to intubation and sedation.  VITAL SIGNS: BP 124/78   Pulse (!) 108   Temp 97.7 F (36.5 C) (Oral)   Resp (!) 30   Ht 6' (1.829 m)   Wt 208 lb 1.8 oz (94.4 kg)   SpO2 100%   BMI 28.23 kg/m   HEMODYNAMICS:    VENTILATOR SETTINGS: Vent Mode: PRVC FiO2 (%):  [40 %-50 %] 40 % Set Rate:  [30 bmp] 30 bmp Vt Set:  [600 mL] 600 mL PEEP:  [8 cmH20-10 cmH20] 8 cmH20 Plateau Pressure:  [22 cmH20-28 cmH20] 22 cmH20  INTAKE / OUTPUT: I/O last 3 completed shifts: In: 1934 [I.V.:1133.3; NG/GT:500.7; IV Piggyback:300] Out: 1448 [Urine:3075; Emesis/NG output:200; Stool:350]  PHYSICAL EXAMINATION: General:  No distress. Family at bedside. Eyes closed. Integument:  No rash on exposed skin. Extremities: Cyanosis and bluish discoloration of right foot. HEENT:  Endotracheal tube in place. No scleral icterus. Cardiovascular:  Regular rate. Sinus rhythm. Unable to appreciate JVD. Pulmonary:  Distant breath sounds. Symmetric chest wall expansion on ventilator. Abdomen: Soft. Normal bowel sounds. Protuberant. Neurological: Somewhat sedated. Opens eyes to voice. Not following commands. Pupils symmetric.  LABS:  BMET  Recent Labs Lab 03/14/17 0418 03/15/17 0410 03/16/17 0356  NA 135 135 136  K 3.4* 3.1* 3.1*  CL 108 109  107  CO2 15* 14* 16*  BUN 59* 69* 74*  CREATININE 2.60* 2.59* 2.32*  GLUCOSE 140* 129* 120*    Electrolytes  Recent Labs Lab 03/12/17 1553  03/13/17 0308  03/14/17 0418 03/15/17 0410 03/16/17 0356  CALCIUM  --   < > 7.8*  < > 7.6* 7.7* 8.0*  MG 2.0  --  2.0  --   --   --  1.7  PHOS 5.0*  --  5.4*  --   --   --  3.7  < > = values in this interval not displayed.  CBC  Recent Labs Lab 03/14/17 0930 03/15/17 0410 03/16/17 0356  WBC 29.1* 27.7* 27.1*  HGB 10.8* 10.3* 10.3*  HCT 35.0* 32.1* 31.3*  PLT 1,134* 895* 677*    Coag's  Recent Labs Lab 03/14/17 0418 03/15/17 0410 03/16/17 0356  INR 5.49* 2.92 3.33    Sepsis Markers  Recent Labs Lab 03/11/17 2343  03/12/17 1030 03/12/17 1553 03/13/17 0308 03/14/17 0418  LATICACIDVEN 0.9  --  3.0* 1.5  --   --   PROCALCITON  --   < > 0.75  --  5.08 8.73  < > = values in this interval not displayed.  ABG  Recent Labs Lab 03/13/17 0955 03/13/17 1553 03/14/17 0424  PHART 7.341* 7.396 7.338*  PCO2ART 27.4* 23.7* 25.0*  PO2ART 58.2* 68.0* 73.0*    Liver Enzymes  Recent Labs Lab 03/14/17 0418 03/15/17 0410 03/16/17 0356  AST 150* 146* 183*  ALT 285* 236* 272*  ALKPHOS 74 82 91  BILITOT 1.5* 1.4* 1.2  ALBUMIN 1.9* 1.7* 1.7*    Cardiac Enzymes  Recent Labs Lab 03/12/17 1030  TROPONINI 0.16*    Glucose  Recent Labs Lab 03/15/17 0741 03/15/17 1134 03/15/17 1257 03/15/17 1601 03/15/17 2348 03/16/17 0404  GLUCAP 101* 117* 112* 118* 111* 105*    Imaging Dg Chest Port 1 View  Result Date: 03/15/2017 CLINICAL DATA:  Acute respiratory failure. EXAM: PORTABLE CHEST 1 VIEW COMPARISON:  One-view chest x-ray the one-view chest x-ray 03/14/2017 FINDINGS: Endotracheal tube is stable, 5 cm above the carina. Side port of the NG tube is just above the GE junction and could be advanced for more optimal positioning. A left subclavian line is stable. Cardiac enlargement, mild edema, and bilateral  effusions are again noted. Bibasilar airspace disease is present, likely reflecting atelectasis. There is no significant interval change. IMPRESSION: 1. Stable cardiomegaly, mild edema, and pleural effusions compatible with congestive heart failure. 2. Support apparatus is stable. 3. The side port of the NG tube is likely just above the GE junction and could be advanced 6-10 cm for more optimal positioning. Electronically Signed   By: San Morelle M.D.   On: 03/15/2017 07:25     IMAGING/STUDIES: PFT 08/30/13: FVC 3.57 L (95%) FEV1 2.38 L (83%) FEV1/FVC 0.66 FEF 25-75 1.34 L (60%) negative bronchodilator response TLC 101% RV 105% DLCO uncorrected 34% RENAL U/S 8/6:   IMPRESSION: 1. No evidence of hydronephrosis. 2. Mildly increased renal parenchymal echogenicity may reflect medical renal disease. TTE 8/10:  LV normal in size with EF 65-70%. No regional wall motion abnormalities. Unable to assess diastolic function due to atrial fibrillation. LA & RA severely dilated. RV moderately dilated with moderate to severely reduced systolic function. No aortic stenosis or regurgitation. Aortic root normal in size. Mild mitral regurgitation without stenosis. Ventricular septum with diastolic flattening and systolic flattening. Moderate tricuspid regurgitation. No pulmonic stenosis or regurgitation. Trivial pericardial effusion. PORT CXR 8/11:  Previously reviewed by me. Endotracheal tube and central  catheter in good position. Enteric feeding tube coursing low diaphragm. Hazy bilateral lower lung opacification consistent with pleural effusions. PORT ABD X-RAY 8/12: IMPRESSION: 1. Nasogastric tube tip in the proximal stomach and side hole in the region of the gastroesophageal junction. 2. Curvilinear density in the inferior pelvis in the expected position of the urinary bladder, most likely representing a Foley catheter. A rectal tube is also a possibility. 3. Cardiomegaly and bilateral pleural  effusions. PORT CXR 8/12:  Pereviously reviewed by me. Persistent bilateral lower lung hazy opacification consistent with pleural effusion. Endotracheal tube approximately 2 cm too high. Enteric feeding tube with side-port at gastroesophageal junction. RUQ U/S >>>  MICROBIOLOGY: MRSA PCR 8/3:  Negative  Tracheal Aspirate Culture 8/8:  Klebsiella pneumoniae Tracheal Aspirate Culture 8/9:  Klebsiella pneumoniae  ANTIBIOTICS: Cefuroxime 8/8 (periop) Cefepime 8/9 - 8/11 Rocephin 8/11 - 8/12 Vancomycin 8/9 - 8/10; Vancomycin 8/13 >>> Cipro 8/13>>>  SIGNIFICANT EVENTS: 08/01 - Admit 08/08 - RLE Fem-Pop bypass & returned to ICU intubated 08/09 - On Neo-Synephrine w/ oliguria & increasing oxygen needs 08/12 - N/V overnight >> tube feedings on hold 08/13 - Changed from Rocephin to Cipro w/ increasing LFTs  LINES/TUBES: OETT 8/8 >>> L [place] CVL 8/9 >>> R RADIAL ART LINE 8/8 >>> Foley >>> OGT >>> PIV  ASSESSMENT / PLAN:  PULMONARY A: Acute on chronic hypoxic respiratory failure: Multifactorial from pneumonia, pleural effusions, and pulmonary edema. Bilateral pleural effusions: Deferring thoracentesis given coagulopathy.  P:   Continuing to wean PEEP Full ventilator support for now Plan to attempt spontaneous breathing trial/pressure support wean wean PEEP is 8 Continuous pulse oximetry monitoring Checking portable chest x-ray in a.m. ABG now  CARDIOVASCULAR A:  Shock: Likely multifactorial but primarily sepsis. Improving. Atrial fibrillation Acute diastolic congestive heart failure Limb Ischemia:  S/P bypass.  P:  Continuous monitoring on telemetry Monitoring vitals per unit protocol Continuing amiodarone Weaning Levophed for mean arterial pressure >65 Diuresis with Lasix Post-op care per vascular surgery  Cardiology following - appreciate assistance  RENAL A:   Hypokalemia: Replaced. Acute on chronic renal failure: Slowly improving. Metabolic acidosis: Slowly  improving.  P:   Trending urine output with Foley catheter Monitor electrolytes and renal function daily Avoiding nephrotoxic agents   GASTROINTESTINAL A:   Transaminitis: Possibly secondary to shock liver. Upper trending. Nausea with vomiting: Unclear etiology. Possibly secondary to transaminitis. Enteric feeding tube advanced on 8/12.  P:   Continuing to hold tube feedings Trending hepatic function panel daily Checking amylase & lipase Checking right upper quadrant ultrasound   HEMATOLOGIC A:   Coagulopathy: Secondary to Coumadin. On Coumadin chronically for history of DVT.  Polycythemia Vera Leukocytosis: Slow improvement. Likely secondary to sepsis. Anemia: No signs of active bleeding. Hemoglobin stable.  P:  Continuing to trend cell counts daily with CBC Trending INR daily Heparin drip per pharmacy protocol once INR < 2.0 Transfusing for Hgb <7.0 or active bleeding  INFECTIOUS A:   Sepsis Klebsiella pneumonia Ischemic right foot/cellulitis  P:   Switching from Rocephin to ciprofloxacin day #5 of 10 of antibiotics Trending Procalcitonin per algorithm Plan to reculture for fever Restarting vancomycin  ENDOCRINE A:   Hypothyroidism  P:   Synthroid via tube daily  NEUROLOGIC A:   Sedation on ventilator Pain control postoperatively  P:   RASS goal: 0 to -1  Fentanyl drip & IV bolus as needed for pain Versed IV when necessary sedation Starting Precedex infusion to allow weaning of fentanyl drip  Prophylaxis:   Heparin drip per pharmacy consult. Protonix IV daily. Diet:  NPO. Holding tube feedings with emesis. Code Status:  Full Code per previous physician discussions. Disposition:  Remains critically ill.  Family Update:  Husband, sister, and son updated at bedside.  DISCUSSION:  76 y.o. female with right leg ischemia post bypass. Acute respiratory failure likely secondary to a combination of pulmonary edema as well as pneumonia. Patient slowly  improving but with guarded prognosis.   I have spent a total of 32 minutes of critical care time today caring for the patient, updating family at bedside, and reviewing the patient's electronic medical record.   Sonia Baller Ashok Cordia, M.D. [facility] Pager:  [phone] After 3pm or if no response, call [phone] 03/16/2017, 8:34 AM

## 2017-03-16 NOTE — Progress Notes (Signed)
eLink Physician-Brief Progress Note Patient Name: Charlotte [name] DOB: 1940-12-19 MRN: [id number]   Date of Service  03/16/2017  HPI/Events of Note    eICU Interventions  Hypokalemia -repleted      Intervention Category Intermediate Interventions: Electrolyte abnormality - evaluation and management  Burnell Matlin V. 03/16/2017, 5:33 AM

## 2017-03-16 NOTE — Progress Notes (Signed)
Pharmacy Antibiotic/Anticoagulant Note  Charlotte [name] is a 76 y.o. female admitted on 03/04/2017 with pneumonia and r/o cellulitis. Currently on IV ceftriaxone for Klebsiella pneumonia. Pharmacy consulted to switch ceftriaxone to ciprofloxacin for rising LFTs and restart vancomycin for r/o cellulitis. SCr up to 2.6 (BL~ 1.1) with poor UOP, WBC and PCT back up.   AC: Pharmacy currently managing IV heparin for afib. Heparin continues to be on hold for INR > 2. H/H low stable. Plt down to 677  Plan: -Cipro 400 mg IV Q 24 hours. Monitor transaminases as these can also be affected by cipro.  -Vancomycin 2 gm IV load followed by vancomycin 750 mg IV Q 24 hours  -Monitor CBC, renal fx, cultures and clinical progress -VT at [facility]  -Continue to hold IV heparin   Height: 6' (182.9 cm) Weight: 208 lb 1.8 oz (94.4 kg) IBW/kg (Calculated) : 73.1  Temp (24hrs), Avg:98 F (36.7 C), Min:97.1 F (36.2 C), Max:98.6 F (37 C)   Recent Labs Lab 03/11/17 2115 03/11/17 2343  03/12/17 1030 03/12/17 1553  03/13/17 0308 03/13/17 1544 03/14/17 0418 03/14/17 0635 03/14/17 0930 03/15/17 0410 03/16/17 0356  WBC  --   --   --  23.4*  --   --  22.1*  --   --  25.2* 29.1* 27.7* 27.1*  CREATININE  --   --   < >  --   --   < > 2.50* 2.51* 2.60*  --   --  2.59* 2.32*  LATICACIDVEN 0.9 0.9  --  3.0* 1.5  --   --   --   --   --   --   --   --   < > = values in this interval not displayed.  Estimated Creatinine Clearance: 27.4 mL/min (A) (by C-G formula based on SCr of 2.32 mg/dL (H)).    No Known Allergies  Antimicrobials this admission: Vanc 8/9 >> 8/11; 8/13>> Cefepime 8/9 >> 8/11 Ceftriaxone 8/11 >>8/13 Cipro 8/13>>  Dose adjustments this admission: None   Microbiology results: 8/9 Trach asp >> rare yeast, mod Kleb  8/8 Trach asp >> rare GPC, rare Kleb 8/3 MRSA PCR: neg Thank you for allowing pharmacy to be a part of this patient's care.  Albertina Parr, PharmD., BCPS Clinical  Pharmacist Pager [phone]

## 2017-03-17 ENCOUNTER — Encounter (HOSPITAL_COMMUNITY): Payer: Self-pay | Admitting: Radiology

## 2017-03-17 ENCOUNTER — Inpatient Hospital Stay (HOSPITAL_COMMUNITY): Payer: Medicare Other

## 2017-03-17 LAB — GLUCOSE, CAPILLARY
Glucose-Capillary: 102 mg/dL — ABNORMAL HIGH (ref 65–99)
Glucose-Capillary: 104 mg/dL — ABNORMAL HIGH (ref 65–99)
Glucose-Capillary: 106 mg/dL — ABNORMAL HIGH (ref 65–99)
Glucose-Capillary: 121 mg/dL — ABNORMAL HIGH (ref 65–99)
Glucose-Capillary: 98 mg/dL (ref 65–99)
Glucose-Capillary: 99 mg/dL (ref 65–99)
Glucose-Capillary: 99 mg/dL (ref 65–99)

## 2017-03-17 LAB — COMPREHENSIVE METABOLIC PANEL
ALBUMIN: 1.8 g/dL — AB (ref 3.5–5.0)
ALK PHOS: 68 U/L (ref 38–126)
ALT: 244 U/L — AB (ref 14–54)
AST: 103 U/L — AB (ref 15–41)
Anion gap: 10 (ref 5–15)
BUN: 77 mg/dL — AB (ref 6–20)
CALCIUM: 7.9 mg/dL — AB (ref 8.9–10.3)
CO2: 19 mmol/L — AB (ref 22–32)
CREATININE: 2.22 mg/dL — AB (ref 0.44–1.00)
Chloride: 108 mmol/L (ref 101–111)
GFR calc Af Amer: 24 mL/min — ABNORMAL LOW (ref >60)
GFR calc non Af Amer: 21 mL/min — ABNORMAL LOW (ref >60)
Glucose, Bld: 123 mg/dL — ABNORMAL HIGH (ref 65–99)
Potassium: 3 mmol/L — ABNORMAL LOW (ref 3.5–5.1)
SODIUM: 137 mmol/L (ref 135–145)
Total Bilirubin: 1.3 mg/dL — ABNORMAL HIGH (ref 0.3–1.2)
Total Protein: 4.5 g/dL — ABNORMAL LOW (ref 6.5–8.1)

## 2017-03-17 LAB — CBC WITH DIFFERENTIAL/PLATELET
BASOS PCT: 0 %
Basophils Absolute: 0 10*3/uL (ref 0.0–0.1)
EOS PCT: 1 %
Eosinophils Absolute: 0.2 10*3/uL (ref 0.0–0.7)
HEMATOCRIT: 33.9 % — AB (ref 36.0–46.0)
HEMOGLOBIN: 10.8 g/dL — AB (ref 12.0–15.0)
LYMPHS ABS: 1 10*3/uL (ref 0.7–4.0)
Lymphocytes Relative: 4 %
MCH: 22.7 pg — ABNORMAL LOW (ref 26.0–34.0)
MCHC: 31.9 g/dL (ref 30.0–36.0)
MCV: 71.4 fL — AB (ref 78.0–100.0)
MONOS PCT: 12 %
Monocytes Absolute: 2.9 10*3/uL — ABNORMAL HIGH (ref 0.1–1.0)
NEUTROS PCT: 83 %
Neutro Abs: 19.8 10*3/uL — ABNORMAL HIGH (ref 1.7–7.7)
Platelets: 593 10*3/uL — ABNORMAL HIGH (ref 150–400)
RBC: 4.75 MIL/uL (ref 3.87–5.11)
RDW: 25.3 % — ABNORMAL HIGH (ref 11.5–15.5)
WBC: 23.9 10*3/uL — AB (ref 4.0–10.5)

## 2017-03-17 LAB — MAGNESIUM: Magnesium: 1.8 mg/dL (ref 1.7–2.4)

## 2017-03-17 LAB — PROTIME-INR
INR: 3.98
PROTHROMBIN TIME: 41.8 s — AB (ref 11.4–15.2)

## 2017-03-17 LAB — PATHOLOGIST SMEAR REVIEW

## 2017-03-17 LAB — PHOSPHORUS: Phosphorus: 4.4 mg/dL (ref 2.5–4.6)

## 2017-03-17 MED ORDER — POTASSIUM CHLORIDE 10 MEQ/50ML IV SOLN
10.0000 meq | INTRAVENOUS | Status: AC
  Administered 2017-03-17 (×6): 10 meq via INTRAVENOUS
  Filled 2017-03-17 (×6): qty 50

## 2017-03-17 MED ORDER — IOPAMIDOL (ISOVUE-300) INJECTION 61%
INTRAVENOUS | Status: AC
  Administered 2017-03-17: 30 mL
  Filled 2017-03-17: qty 30

## 2017-03-17 NOTE — Progress Notes (Addendum)
  Progress Note    03/17/2017 8:45 AM 6 Days Post-Op  Subjective:  Intubated.  Family says she has been a little bit more alert this morning.  Family says right foot looks better to them.  Afebrile HR 70's-120's Afib 54'S-568'L systolic 27% .[id number]  Gtts: Fentanyl Amiodarone Levophed   Vitals:   03/17/17 0600 03/17/17 0700  BP: (!) 103/54 99/64  Pulse: 95 (!) 108  Resp: 20 20  Temp:    SpO2: 96% 96%    Physical Exam: Cardiac:  irregular Lungs:  Intubated on .[id number] Extremities:  Brisk doppler signals right PT/AT   CBC    Component Value Date/Time   WBC 23.9 (H) 03/17/2017 0528   RBC 4.75 03/17/2017 0528   HGB 10.8 (L) 03/17/2017 0528   HCT 33.9 (L) 03/17/2017 0528   PLT 593 (H) 03/17/2017 0528   MCV 71.4 (L) 03/17/2017 0528   MCH 22.7 (L) 03/17/2017 0528   MCHC 31.9 03/17/2017 0528   RDW 25.3 (H) 03/17/2017 0528   LYMPHSABS PENDING 03/17/2017 0528   MONOABS PENDING 03/17/2017 0528   EOSABS PENDING 03/17/2017 0528   BASOSABS PENDING 03/17/2017 0528    BMET    Component Value Date/Time   NA 136 03/16/2017 0356   K 3.1 (L) 03/16/2017 0356   CL 107 03/16/2017 0356   CO2 16 (L) 03/16/2017 0356   GLUCOSE 120 (H) 03/16/2017 0356   BUN 74 (H) 03/16/2017 0356   CREATININE 2.32 (H) 03/16/2017 0356   CREATININE 1.21 (H) 08/14/2016 0733   CALCIUM 8.0 (L) 03/16/2017 0356   GFRNONAA 20 (L) 03/16/2017 0356   GFRAA 23 (L) 03/16/2017 0356    INR    Component Value Date/Time   INR 3.98 03/17/2017 0528     Intake/Output Summary (Last 24 hours) at 03/17/17 0845 Last data filed at 03/17/17 0700  Gross per 24 hour  Intake          1764.16 ml  Output             1960 ml  Net          -195.84 ml     Assessment:  76 y.o. female is s/p:  fem-tp trunk bypass with in situ vein  6 Days Post-Op  Plan: -pt continues to be on vent -she has patent bypass with brisk doppler signals right PT/AT -vent/medical issues per CCM -still with leukocytosis but starting  to trend downward    Leontine Locket, PA-C [facility] [phone] 03/17/2017 8:45 AM   I have independently interviewed and examined patient and agree with PA assessment and plan above. Right 4th/5th toes appear to be more dusky but the rest of her foot looks good with brisk capillary refill. Appreciate care by pccm.  Brandon C. [name], MD [facility] [place] Office: [phone] Pager: [phone]

## 2017-03-17 NOTE — Care Management Note (Addendum)
Case Management Note [name] RN, BSN Unit 4E-Case Manager [phone]  Patient Details  Name: Charlotte [name] MRN: [id number] Date of Birth: 1940-12-06  Subjective/Objective:     Pt admitted with LE arterial insufficiency- plan for OR on Thur 8/9 for  Fem-tibioperoneal bypass for attempted limb salvage- remains on IV heparin              Action/Plan: PTA pt lived at home with spouse- will need PT/OT evals when able to participate to assist in d/c planning- pt may be eligible for Home First program with [facility]- CM will continue to follow post op for d/c needs and plan.   Expected Discharge Date:                 Expected Discharge Plan:  [place]  In-House Referral:  Clinical Social Work  Discharge planning Services  CM Consult  Post Acute Care Choice:    Choice offered to:     DME Arranged:    DME Agency:     HH Arranged:    [place] Agency:     Status of Service:  In process, will continue to follow  If discussed at Long Length of Stay Meetings, dates discussed:  8/7  Discharge Disposition:   Additional Comments: 03/17/2017  Informed by [facility] that pt was referred to them by Vascular Surgery for  [facility], PT and OT if needed.    Discussed in LOS 8/14 - pt remains appropriate for continued stay.  LTACH referral given during LOS - however attending has deemed LTACH referral at this time to be inappropriate; pt remains on ETT vent and not stable to transfer. [name], RN 03/17/2017, 10:26 AM

## 2017-03-17 NOTE — Progress Notes (Signed)
PT Cancellation Note  Patient Details Name: STARLIT RABURN MRN: [id number] DOB: 1941-07-16   Cancelled Treatment:    Reason Eval/Treat Not Completed: Medical issues which prohibited therapy. Pt remains intubated. Have been checking on since 8/9. Will sign off at this time. Please re-order when medically ready. Thanks.   Shary Decamp [facility] 03/17/2017, 9:51 AM  Suanne Marker PT [phone]

## 2017-03-17 NOTE — Progress Notes (Signed)
OT Cancellation Note  Patient Details Name: ZONIA CAPLIN MRN: [id number] DOB: [birth year]   Cancelled Treatment:    Reason Eval/Treat Not Completed: Medical issues which prohibited therapy.  Pt remains intubated  And sedated.  Have been checking on since last week.  Will sign off at this time, please reorder once medically appropriate and able to participate.  [place], OTR/L [phone]   [name] 03/17/2017, 10:32 AM

## 2017-03-17 NOTE — Progress Notes (Signed)
DAILY PROGRESS NOTE   Patient Name: Charlotte [name] Date of Encounter: 03/17/2017  Hospital Problem List   Principal Problem:   Lower extremity arterial insufficiency, severe, right ([place]) Active Problems:   Polycythemia vera ([place])   Chronic atrial fibrillation ([place])   HTN (hypertension)   Hypothyroidism   Peripheral vascular disease (HCC)   Thrombocytosis (HCC)   Hyperlipidemia   Acute renal failure (ARF) (HCC)   Pressure injury of skin    Chief Complaint   Remains intubated - A-fib with rates in the 100-120 range  Subjective   Diuresed 2L overnight, but only 234 cc negative. Creatinine yesterday was 2.32. Weight is down 2 lbs.  Objective   Vitals:   03/17/17 0800 03/17/17 0810 03/17/17 0850 03/17/17 0900  BP: 115/61 115/61  110/62  Pulse: (!) 110 (!) 108  (!) 129  Resp: '20 20  20  '$ Temp:   98.2 F (36.8 C)   TempSrc:   Axillary   SpO2: 97% 96%  95%  Weight:      Height:        Intake/Output Summary (Last 24 hours) at 03/17/17 1117 Last data filed at 03/17/17 9518  Gross per 24 hour  Intake          1009.26 ml  Output             1780 ml  Net          -770.74 ml   Filed Weights   03/15/17 0455 03/16/17 0505 03/17/17 0456  Weight: 215 lb 6.2 oz (97.7 kg) 208 lb 1.8 oz (94.4 kg) 206 lb 5.6 oz (93.6 kg)    Physical Exam   General appearance: intubated, eyes open but staring blankly Neck: no carotid bruit, no JVD and thyroid not enlarged, symmetric, no tenderness/mass/nodules Lungs: diminished breath sounds bilaterally Heart: irregularly irregular rhythm Abdomen: soft, non-tender; bowel sounds normal; no masses,  no organomegaly Extremities: right lower extremity cool and somewhat cyanotic Pulses: decreased pulses to the right lower extremity Skin: skin weeping, cyanosis of the RLE Neurologic: Mental status: Intubated, eyes open, pupils pinpoint Psych: Unable to assess  Inpatient Medications    Scheduled Meds: . aspirin  81 mg Per Tube Daily  .  chlorhexidine gluconate (MEDLINE KIT)  15 mL Mouth Rinse BID  . Chlorhexidine Gluconate Cloth  6 each Topical Daily  . docusate  100 mg Per Tube Daily  . feeding supplement (PRO-STAT SUGAR FREE 64)  30 mL Per Tube TID  . fentaNYL (SUBLIMAZE) injection  50 mcg Intravenous Once  . gabapentin  100 mg Per Tube Q8H  . guaiFENesin  20 mL Per Tube TID  . hydroxyurea  1,000 mg Per Tube Daily  . insulin aspart  0-15 Units Subcutaneous Q4H  . levothyroxine  75 mcg Per NG tube QAC breakfast  . mouth rinse  15 mL Mouth Rinse 10 times per day  . pantoprazole (PROTONIX) IV  40 mg Intravenous Q24H  . polyethylene glycol  17 g Oral Daily  . sennosides  5 mL Per Tube BID  . sodium chloride flush  10-40 mL Intracatheter Q12H    Continuous Infusions: . amiodarone 30 mg/hr (03/17/17 0900)  . ciprofloxacin Stopped (03/17/17 1044)  . dexmedetomidine (PRECEDEX) IV infusion Stopped (03/16/17 1300)  . feeding supplement (VITAL AF 1.2 CAL) Stopped (03/15/17 0501)  . fentaNYL infusion INTRAVENOUS 125 mcg/hr (03/17/17 0900)  . furosemide Stopped (03/17/17 1044)  . norepinephrine (LEVOPHED) Adult infusion 8.96 mcg/min (03/17/17 0900)  . vancomycin  PRN Meds: alum & mag hydroxide-simeth, fentaNYL, guaiFENesin-dextromethorphan, hydrALAZINE, labetalol, metoprolol tartrate, midazolam, ondansetron **OR** ondansetron (ZOFRAN) IV, phenol, sodium chloride flush   Labs   Results for orders placed or performed during the hospital encounter of 03/04/17 (from the past 48 hour(s))  Glucose, capillary     Status: Abnormal   Collection Time: 03/15/17 11:34 AM  Result Value Ref Range   Glucose-Capillary 117 (H) 65 - 99 mg/dL   Comment 1 Arterial Specimen   Glucose, capillary     Status: Abnormal   Collection Time: 03/15/17 12:57 PM  Result Value Ref Range   Glucose-Capillary 112 (H) 65 - 99 mg/dL  Glucose, capillary     Status: Abnormal   Collection Time: 03/15/17  4:01 PM  Result Value Ref Range    Glucose-Capillary 118 (H) 65 - 99 mg/dL   Comment 1 Notify RN   Glucose, capillary     Status: Abnormal   Collection Time: 03/15/17  8:17 PM  Result Value Ref Range   Glucose-Capillary 102 (H) 65 - 99 mg/dL   Comment 1 Notify RN   Glucose, capillary     Status: Abnormal   Collection Time: 03/15/17 11:48 PM  Result Value Ref Range   Glucose-Capillary 111 (H) 65 - 99 mg/dL   Comment 1 Notify RN   Comprehensive metabolic panel     Status: Abnormal   Collection Time: 03/16/17  3:56 AM  Result Value Ref Range   Sodium 136 135 - 145 mmol/L   Potassium 3.1 (L) 3.5 - 5.1 mmol/L   Chloride 107 101 - 111 mmol/L   CO2 16 (L) 22 - 32 mmol/L   Glucose, Bld 120 (H) 65 - 99 mg/dL   BUN 74 (H) 6 - 20 mg/dL   Creatinine, Ser 2.32 (H) 0.44 - 1.00 mg/dL   Calcium 8.0 (L) 8.9 - 10.3 mg/dL   Total Protein 4.4 (L) 6.5 - 8.1 g/dL   Albumin 1.7 (L) 3.5 - 5.0 g/dL   AST 183 (H) 15 - 41 U/L   ALT 272 (H) 14 - 54 U/L   Alkaline Phosphatase 91 38 - 126 U/L   Total Bilirubin 1.2 0.3 - 1.2 mg/dL   GFR calc non Af Amer 20 (L) >60 mL/min   GFR calc Af Amer 23 (L) >60 mL/min    Comment: (NOTE) The eGFR has been calculated using the CKD EPI equation. This calculation has not been validated in all clinical situations. eGFR's persistently <60 mL/min signify possible Chronic Kidney Disease.    Anion gap 13 5 - 15  CBC with Differential/Platelet     Status: Abnormal   Collection Time: 03/16/17  3:56 AM  Result Value Ref Range   WBC 27.1 (H) 4.0 - 10.5 K/uL    Comment: REPEATED TO VERIFY WHITE COUNT CONFIRMED ON SMEAR    RBC 4.43 3.87 - 5.11 MIL/uL   Hemoglobin 10.3 (L) 12.0 - 15.0 g/dL   HCT 31.3 (L) 36.0 - 46.0 %   MCV 70.7 (L) 78.0 - 100.0 fL   MCH 23.3 (L) 26.0 - 34.0 pg   MCHC 32.9 30.0 - 36.0 g/dL   RDW 25.5 (H) 11.5 - 15.5 %   Platelets 677 (H) 150 - 400 K/uL    Comment: REPEATED TO VERIFY PLATELET COUNT CONFIRMED BY SMEAR    Neutrophils Relative % 89 %   Lymphocytes Relative 3 %    Monocytes Relative 8 %   Eosinophils Relative 0 %   Basophils Relative 0 %  Neutro Abs 24.1 (H) 1.7 - 7.7 K/uL   Lymphs Abs 0.8 0.7 - 4.0 K/uL   Monocytes Absolute 2.2 (H) 0.1 - 1.0 K/uL   Eosinophils Absolute 0.0 0.0 - 0.7 K/uL   Basophils Absolute 0.0 0.0 - 0.1 K/uL   RBC Morphology POLYCHROMASIA PRESENT     Comment: BURR CELLS ELLIPTOCYTES RARE NRBCs SPHEROCYTES    WBC Morphology HYPERSEGMENTED NEUT     Comment: TOXIC GRANULATION PYKNOTIC NUCLEI   Magnesium     Status: None   Collection Time: 03/16/17  3:56 AM  Result Value Ref Range   Magnesium 1.7 1.7 - 2.4 mg/dL  Phosphorus     Status: None   Collection Time: 03/16/17  3:56 AM  Result Value Ref Range   Phosphorus 3.7 2.5 - 4.6 mg/dL  Protime-INR     Status: Abnormal   Collection Time: 03/16/17  3:56 AM  Result Value Ref Range   Prothrombin Time 34.6 (H) 11.4 - 15.2 seconds   INR 3.33   Glucose, capillary     Status: Abnormal   Collection Time: 03/16/17  4:04 AM  Result Value Ref Range   Glucose-Capillary 105 (H) 65 - 99 mg/dL   Comment 1 Notify RN   Amylase     Status: Abnormal   Collection Time: 03/16/17  9:00 AM  Result Value Ref Range   Amylase 102 (H) 28 - 100 U/L  Lipase, blood     Status: None   Collection Time: 03/16/17  9:00 AM  Result Value Ref Range   Lipase 43 11 - 51 U/L  Glucose, capillary     Status: Abnormal   Collection Time: 03/16/17  9:10 AM  Result Value Ref Range   Glucose-Capillary 110 (H) 65 - 99 mg/dL   Comment 1 Capillary Specimen   I-STAT 3, arterial blood gas (G3+)     Status: Abnormal   Collection Time: 03/16/17 10:31 AM  Result Value Ref Range   pH, Arterial 7.438 7.350 - 7.450   pCO2 arterial 24.1 (L) 32.0 - 48.0 mmHg   pO2, Arterial 125.0 (H) 83.0 - 108.0 mmHg   Bicarbonate 16.3 (L) 20.0 - 28.0 mmol/L   TCO2 17 0 - 100 mmol/L   O2 Saturation 99.0 %   Acid-base deficit 6.0 (H) 0.0 - 2.0 mmol/L   Patient temperature 98.6 F    Collection site RADIAL, ALLEN'S TEST ACCEPTABLE     Drawn by Operator    Sample type ARTERIAL   Glucose, capillary     Status: Abnormal   Collection Time: 03/16/17  1:50 PM  Result Value Ref Range   Glucose-Capillary 108 (H) 65 - 99 mg/dL   Comment 1 Capillary Specimen   Glucose, capillary     Status: Abnormal   Collection Time: 03/16/17  5:32 PM  Result Value Ref Range   Glucose-Capillary 131 (H) 65 - 99 mg/dL   Comment 1 Capillary Specimen   Glucose, capillary     Status: Abnormal   Collection Time: 03/16/17  8:54 PM  Result Value Ref Range   Glucose-Capillary 110 (H) 65 - 99 mg/dL   Comment 1 Capillary Specimen    Comment 2 Notify RN   Glucose, capillary     Status: Abnormal   Collection Time: 03/17/17 12:06 AM  Result Value Ref Range   Glucose-Capillary 121 (H) 65 - 99 mg/dL   Comment 1 Capillary Specimen    Comment 2 Notify RN   Glucose, capillary     Status: Abnormal  Collection Time: 03/17/17  4:21 AM  Result Value Ref Range   Glucose-Capillary 106 (H) 65 - 99 mg/dL   Comment 1 Capillary Specimen    Comment 2 Notify RN   Protime-INR     Status: Abnormal   Collection Time: 03/17/17  5:28 AM  Result Value Ref Range   Prothrombin Time 41.8 (H) 11.4 - 15.2 seconds   INR 3.98   CBC with Differential/Platelet     Status: Abnormal   Collection Time: 03/17/17  5:28 AM  Result Value Ref Range   WBC 23.9 (H) 4.0 - 10.5 K/uL   RBC 4.75 3.87 - 5.11 MIL/uL   Hemoglobin 10.8 (L) 12.0 - 15.0 g/dL   HCT 20.5 (L) 79.3 - 41.6 %   MCV 71.4 (L) 78.0 - 100.0 fL   MCH 22.7 (L) 26.0 - 34.0 pg   MCHC 31.9 30.0 - 36.0 g/dL   RDW 10.6 (H) 61.6 - 81.5 %   Platelets 593 (H) 150 - 400 K/uL    Comment: PLATELET COUNT CONFIRMED BY SMEAR   Neutrophils Relative % 83 %   Lymphocytes Relative 4 %   Monocytes Relative 12 %   Eosinophils Relative 1 %   Basophils Relative 0 %   Neutro Abs 19.8 (H) 1.7 - 7.7 K/uL   Lymphs Abs 1.0 0.7 - 4.0 K/uL   Monocytes Absolute 2.9 (H) 0.1 - 1.0 K/uL   Eosinophils Absolute 0.2 0.0 - 0.7 K/uL    Basophils Absolute 0.0 0.0 - 0.1 K/uL   RBC Morphology POLYCHROMASIA PRESENT     Comment: TARGET CELLS BURR CELLS ELLIPTOCYTES    WBC Morphology VACUOLATED NEUTROPHILS     Comment: TOXIC GRANULATION  Glucose, capillary     Status: None   Collection Time: 03/17/17  8:41 AM  Result Value Ref Range   Glucose-Capillary 98 65 - 99 mg/dL   Comment 1 Capillary Specimen    Comment 2 Notify RN     ECG   N/A - Personally Reviewed  Telemetry   A-fib with CVR - Personally Reviewed  Radiology    Dg Abd 1 View  Result Date: 03/15/2017 CLINICAL DATA:  Adjustment of abdominal device EXAM: ABDOMEN - 1 VIEW COMPARISON:  03/15/2017 FINDINGS: NG tube advanced with the tip in the body the stomach. Normal bowel gas pattern. IMPRESSION: NG tube in the body the stomach. Electronically Signed   By: Marlan Palau M.D.   On: 03/15/2017 19:37   Dg Chest Port 1 View  Result Date: 03/15/2017 CLINICAL DATA:  ETT advanced 2 cm EXAM: PORTABLE CHEST 1 VIEW COMPARISON:  03/15/2017 FINDINGS: Endotracheal tube 2 cm above the carina.  Central line in the SVC Bilateral pleural effusions and bibasilar atelectasis mildly improved. IMPRESSION: Endotracheal tube now 2 cm above the carina Bilateral pleural effusions and bibasilar atelectasis show mild interval improvement. Electronically Signed   By: Marlan Palau M.D.   On: 03/15/2017 19:35   Dg Chest Port 1 View  Result Date: 03/15/2017 CLINICAL DATA:  Acute respiratory failure. EXAM: PORTABLE CHEST 1 VIEW COMPARISON:  One-view chest x-ray 6/the one-view chest x-ray 03/14/2017 FINDINGS: Endotracheal tube is stable, 5 cm above the carina. Side port of the NG tube is just above the GE junction and could be advanced for more optimal positioning. A left subclavian line is stable. Cardiac enlargement, mild edema, and bilateral effusions are again noted. Bibasilar airspace disease is present, likely reflecting atelectasis. There is no significant interval change. IMPRESSION:  1. Stable cardiomegaly, mild edema,  and pleural effusions compatible with congestive heart failure. 2. Support apparatus is stable. 3. The side port of the NG tube is likely just above the GE junction and could be advanced 6-10 cm for more optimal positioning. Electronically Signed   By: San Morelle M.D.   On: 03/15/2017 07:25   Dg Abd Portable 1v  Result Date: 03/15/2017 CLINICAL DATA:  Increased vomiting. EXAM: PORTABLE ABDOMEN - 1 VIEW COMPARISON:  03/11/2017. FINDINGS: Paucity of intestinal gas with no visible dilated bowel loops. No significant change in bilateral pleural effusions. Enlarged cardiac silhouette. Curvilinear density in the inferior pelvis in the expected position of the urinary bladder. Right hip fixation screws. Atheromatous arterial calcifications. Nasogastric tube tip in the proximal stomach and side hole in the region of the gastroesophageal junction. IMPRESSION: 1. Nasogastric tube tip in the proximal stomach and side hole in the region of the gastroesophageal junction. 2. Curvilinear density in the inferior pelvis in the expected position of the urinary bladder, most likely representing a Foley catheter. A rectal tube is also a possibility. 3. Cardiomegaly and bilateral pleural effusions. Electronically Signed   By: Claudie Revering M.D.   On: 03/15/2017 11:13    Cardiac Studies   LV EF: 65% -   70%  ------------------------------------------------------------------- Indications:      Atrial fibrillation - 427.31.  ------------------------------------------------------------------- History:   PMH:  Chronic Kidney Failure. Shock unknown etiology. Right foot Ischemia. Peripheral Vascular Disease. Bilateral Pleural Effusions.  Congestive heart failure.  ------------------------------------------------------------------- Study Conclusions  - Left ventricle: The cavity size was normal. Systolic function was   vigorous. The estimated ejection fraction was in the  range of 65%   to 70%. Wall motion was normal; there were no regional wall   motion abnormalities. The study was not technically sufficient to   allow evaluation of LV diastolic dysfunction due to atrial   fibrillation. - Ventricular septum: The contour showed diastolic flattening and   systolic flattening. - Mitral valve: Calcified annulus. Mildly thickened leaflets .   There was mild regurgitation. - Left atrium: The atrium was severely dilated. - Right ventricle: The cavity size was moderately dilated. Wall   thickness was normal. Systolic function was moderately to   severely reduced. - Right atrium: The atrium was severely dilated. - Tricuspid valve: There was moderate regurgitation. - Pulmonic valve: There was no regurgitation. - Pulmonary arteries: Systolic pressure was moderately increased.   PA peak pressure: 59 mm Hg (S). - Pericardium, extracardiac: A trivial pericardial effusion was   identified. Features were not consistent with tamponade   physiology. There was a large left pleural effusion.  Assessment   Principal Problem:   Lower extremity arterial insufficiency, severe, right (HCC) Active Problems:   Polycythemia vera (HCC)   Chronic atrial fibrillation (HCC)   HTN (hypertension)   Hypothyroidism   Peripheral vascular disease (HCC)   Thrombocytosis (HCC)   Hyperlipidemia   Acute renal failure (ARF) (HCC)   Pressure injury of skin   Plan   CXR today shows mild improvement in pleural effusions. Minimally net negative d/t excess in's. Would continue IV diuresis and amiodarone for rate-control. Remains on neosynephrine - BP will not tolerate additional rate control meds.  Time Spent Directly with Patient:  I have spent a total of 25 minutes with the patient reviewing hospital notes, telemetry, EKGs, labs and examining the patient as well as establishing an assessment and plan that was discussed personally with the patient. > 50% of time was spent in direct  patient care.  Length of Stay:  LOS: 13 days   Pixie Casino, MD, [place]  Attending Cardiologist  Direct Dial: [phone]  Fax: [phone]  Website:  [email or web address] 03/17/2017, 11:17 AM

## 2017-03-17 NOTE — Progress Notes (Signed)
ANTICOAGULATION CONSULT NOTE - Follow Up Consult  Pharmacy Consult for Heparin Indication: Afib  No Known Allergies  Patient Measurements:  Heparin Dosing Weight: 79 kg  Vital Signs: Temp: 98.2 F (36.8 C) (08/14 0850) Temp Source: Axillary (08/14 0850) BP: 110/62 (08/14 0900) Pulse Rate: 129 (08/14 0900)  Labs:  Recent Labs  03/15/17 0410 03/16/17 0356 03/17/17 0528  HGB 10.3* 10.3* 10.8*  HCT 32.1* 31.3* 33.9*  PLT 895* 677* 593*  LABPROT 31.1* 34.6* 41.8*  INR 2.92 3.33 3.98  CREATININE 2.59* 2.32*  --     Estimated Creatinine Clearance: 27.3 mL/min (A) (by C-G formula based on SCr of 2.32 mg/dL (H)).   Assessment: 76 yo F presents with leg pain and known chronic occlusion. Pt is on warfarin PTA for AFib that was bridged with IV heparin pending procedures. INR then trended up despite holding warfarin throughout admit and giving vitamin K 8/2, so heparin infusion was turned off.   INR remains supratherapeutic at 3.98 this morning. Hgb stable, pltc elevated.  Goal of Therapy:  Heparin level 0.3-0.7 units/ml Monitor platelets by anticoagulation protocol: Yes   Plan:  -Continue to hold heparin - F/U with [facility] prior to resuming if INR trends down -Monitor INR, S/Sx bleeding daily  Arrie Senate, PharmD PGY-2 Cardiology Pharmacy Resident Pager: [phone] 03/17/2017

## 2017-03-17 NOTE — Progress Notes (Addendum)
Restarted tube feed at 10 ml/hr per order. Approximately 30 minutes after start patient vomited green bile. Tube feed was stopped and RN has paged ordering provider.   Dr. Ashok Cordia returned page, will order a CT abdomen and patient to be NPO at this time.

## 2017-03-17 NOTE — Progress Notes (Signed)
PULMONARY / CRITICAL CARE MEDICINE   Name: Charlotte [name] MRN: [id number] DOB: 03-25-1941    ADMISSION DATE:  03/04/2017   CONSULTATION DATE:  03/11/2017  REFERRING MD:  Windell Moulding  CHIEF COMPLAINT:  Leg pain  BRIEF HPI:  76 y.o.   female with known atrial fibrillation, polycythemia, and peripheral vascular disease. Patient also has known congestive heart failure and is on chronic oxygen therapy. Admitted on 8/1 with limb ischemia and subsequently pain. Underwent bypass on 8/8 and return to the intensive care unit intubated.  SUBJECTIVE:  Continuing to wean PEEP. Still not on any tube feedings. Not following commands today.  REVIEW OF SYSTEMS:  Unable to obtain secondary to intubation and sedation.  VITAL SIGNS: BP 125/63   Pulse (!) 112   Temp 99.2 F (37.3 C) (Axillary)   Resp 20   Ht 6' (1.829 m)   Wt 206 lb 5.6 oz (93.6 kg)   SpO2 96%   BMI 27.99 kg/m   HEMODYNAMICS:    VENTILATOR SETTINGS: Vent Mode: PRVC FiO2 (%):  [30 %-40 %] 30 % Set Rate:  [20 bmp] 20 bmp Vt Set:  [600 mL] 600 mL PEEP:  [5 cmH20] 5 cmH20 Plateau Pressure:  [16 cmH20-18 cmH20] 16 cmH20  INTAKE / OUTPUT: I/O last 3 completed shifts: In: 2394.7 [I.V.:1514.7; NG/GT:180; IV [id number]:326] Out: 7124 [Urine:2775; Emesis/NG output:600; Stool:150]  PHYSICAL EXAMINATION: General:  Eyes open. No family at bedside. No distress. Integument:  No rash on exposed skin. Erythema over right lower extremity. Incision clean, dry, and intact. Extremities: Continued cyanosis of right foot. HEENT:  No scleral icterus. Endotracheal tube remains in place. Cardiovascular:  Slightly tachycardic. Irregular rhythm. Pulmonary:  Good aeration bilaterally. Symmetric chest wall rise on ventilator. Abdomen: Soft. Normal bowel sounds. Protuberant. Neurological: Eyes open. Spontaneously moving upper extremities. Does not attend to voice or not to questions.  LABS:  BMET  Recent Labs Lab 03/14/17 0418  03/15/17 0410 03/16/17 0356  NA 135 135 136  K 3.4* 3.1* 3.1*  CL 108 109 107  CO2 15* 14* 16*  BUN 59* 69* 74*  CREATININE 2.60* 2.59* 2.32*  GLUCOSE 140* 129* 120*    Electrolytes  Recent Labs Lab 03/12/17 1553  03/13/17 0308  03/14/17 0418 03/15/17 0410 03/16/17 0356  CALCIUM  --   < > 7.8*  < > 7.6* 7.7* 8.0*  MG 2.0  --  2.0  --   --   --  1.7  PHOS 5.0*  --  5.4*  --   --   --  3.7  < > = values in this interval not displayed.  CBC  Recent Labs Lab 03/15/17 0410 03/16/17 0356 03/17/17 0528  WBC 27.7* 27.1* 23.9*  HGB 10.3* 10.3* 10.8*  HCT 32.1* 31.3* 33.9*  PLT 895* 677* 593*    Coag's  Recent Labs Lab 03/15/17 0410 03/16/17 0356 03/17/17 0528  INR 2.92 3.33 3.98    Sepsis Markers  Recent Labs Lab 03/11/17 2343  03/12/17 1030 03/12/17 1553 03/13/17 0308 03/14/17 0418  LATICACIDVEN 0.9  --  3.0* 1.5  --   --   PROCALCITON  --   < > 0.75  --  5.08 8.73  < > = values in this interval not displayed.  ABG  Recent Labs Lab 03/13/17 1553 03/14/17 0424 03/16/17 1031  PHART 7.396 7.338* 7.438  PCO2ART 23.7* 25.0* 24.1*  PO2ART 68.0* 73.0* 125.0*    Liver Enzymes  Recent Labs Lab 03/14/17 0418 03/15/17 0410 03/16/17  0356  AST 150* 146* 183*  ALT 285* 236* 272*  ALKPHOS 74 82 91  BILITOT 1.5* 1.4* 1.2  ALBUMIN 1.9* 1.7* 1.7*    Cardiac Enzymes  Recent Labs Lab 03/12/17 1030  TROPONINI 0.16*    Glucose  Recent Labs Lab 03/16/17 1732 03/16/17 2054 03/17/17 0006 03/17/17 0421 03/17/17 0841 03/17/17 1113  GLUCAP 131* 110* 121* 106* 98 99    Imaging Dg Chest Port 1 View  Result Date: 03/15/2017 CLINICAL DATA:  Acute respiratory failure. EXAM: PORTABLE CHEST 1 VIEW COMPARISON:  One-view chest x-ray the one-view chest x-ray 03/14/2017 FINDINGS: Endotracheal tube is stable, 5 cm above the carina. Side port of the NG tube is just above the GE junction and could be advanced for more optimal positioning. A left  subclavian line is stable. Cardiac enlargement, mild edema, and bilateral effusions are again noted. Bibasilar airspace disease is present, likely reflecting atelectasis. There is no significant interval change. IMPRESSION: 1. Stable cardiomegaly, mild edema, and pleural effusions compatible with congestive heart failure. 2. Support apparatus is stable. 3. The side port of the NG tube is likely just above the GE junction and could be advanced 6-10 cm for more optimal positioning. Electronically Signed   By: San Morelle M.D.   On: 03/15/2017 07:25     IMAGING/STUDIES: PFT 08/30/13: FVC 3.57 L (95%) FEV1 2.38 L (83%) FEV1/FVC 0.66 FEF 25-75 1.34 L (60%) negative bronchodilator response TLC 101% RV 105% DLCO uncorrected 34% RENAL U/S 8/6:   IMPRESSION: 1. No evidence of hydronephrosis. 2. Mildly increased renal parenchymal echogenicity may reflect medical renal disease. TTE 8/10:  LV normal in size with EF 65-70%. No regional wall motion abnormalities. Unable to assess diastolic function due to atrial fibrillation. LA & RA severely dilated. RV moderately dilated with moderate to severely reduced systolic function. No aortic stenosis or regurgitation. Aortic root normal in size. Mild mitral regurgitation without stenosis. Ventricular septum with diastolic flattening and systolic flattening. Moderate tricuspid regurgitation. No pulmonic stenosis or regurgitation. Trivial pericardial effusion. PORT CXR 8/11:  Previously reviewed by me. Endotracheal tube and central  catheter in good position. Enteric feeding tube coursing low diaphragm. Hazy bilateral lower lung opacification consistent with pleural effusions. PORT ABD X-RAY 8/12: IMPRESSION: 1. Nasogastric tube tip in the proximal stomach and side hole in the region of the gastroesophageal junction. 2. Curvilinear density in the inferior pelvis in the expected position of the urinary bladder, most likely representing a Foley catheter. A rectal tube  is also a possibility. 3. Cardiomegaly and bilateral pleural effusions. PORT CXR 8/12:  Pereviously reviewed by me. Persistent bilateral lower lung hazy opacification consistent with pleural effusion. Endotracheal tube approximately 2 cm too high. Enteric feeding tube with side-port at gastroesophageal junction. RUQ U/S 8/13: IMPRESSION: 1. Biliary sludge and small gallstones within the gallbladder lumen. 2. No evidence of biliary obstruction. 3. Liver parenchyma mildly heterogeneous without evidence of significant steatosis or cirrhosis. A small amount of ascites is present as well as a right pleural effusion. PORT CXR 8/14:  Personally reviewed by me. Endotracheal tube and central venous catheter in good position. Enteric feeding tube coursing low diaphragm. Continue to hazy bilateral lower lung opacities consistent with pleural effusions.  MICROBIOLOGY: MRSA PCR 8/3:  Negative  Tracheal Aspirate Culture 8/8:  Klebsiella pneumoniae Tracheal Aspirate Culture 8/9:  Klebsiella pneumoniae  ANTIBIOTICS: Cefuroxime 8/8 (periop) Cefepime 8/9 - 8/11 Rocephin 8/11 - 8/12 Vancomycin 8/9 - 8/10; Vancomycin 8/13 >>> Cipro 8/13>>>  SIGNIFICANT EVENTS: 08/01 -  Admit 08/08 - RLE Fem-Pop bypass & returned to ICU intubated 08/09 - On Neo-Synephrine w/ oliguria & increasing oxygen needs 08/12 - N/V overnight >> tube feedings on hold 08/13 - Changed from Rocephin to Cipro w/ increasing LFTs  LINES/TUBES: OETT 8/8 >>> L [place] CVL 8/9 >>> R RADIAL ART LINE 8/8 >>> Foley >>> OGT >>> PIV  ASSESSMENT / PLAN:  PULMONARY A: Acute on chronic hypoxic respiratory failure: Multifactorial from pneumonia, pleural effusions, and pulmonary edema. Bilateral pleural effusions: Deferring thoracentesis given coagulopathy.    P:   Continuing ventilator weaning Daily spontaneous breathing trial/pressure support wean as tolerated Continuous pulse oximetry monitoring Intermittent imaging with  x-ray Continuing to defer thoracentesis based on coagulopathy  CARDIOVASCULAR A:  Shock: Likely multifactorial but primarily sepsis. Improving. Atrial fibrillation Acute diastolic congestive heart failure Limb Ischemia:  S/P bypass.  P:  Continuing to wean vasopressor infusion for mean arterial pressure >65 Continuous telemetry monitoring Vitals per unit protocol Continuing amiodarone Continuing Lasix diuresis Postoperative care per vascular surgery Cardiology following & appreciate assistance  RENAL A:   Hypokalemia: Replaced. Acute on chronic renal failure: Slowly improving. Metabolic acidosis: Slowly improving.  P:   Awaiting a.m. labs Trending urine output with Foley Continue to monitor renal function daily along with electrolytes   GASTROINTESTINAL A:   Transaminitis: Likely secondary to shock liver. Trending upward. Nausea with vomiting: Unclear etiology. Possibly secondary to transaminitis. Enteric feeding tube advanced on 8/12.  P:   Awaiting results of a.m. labs Trending hepatic function panel daily Nothing by mouth  HEMATOLOGIC A:   Coagulopathy: Secondary to Coumadin. On Coumadin chronically for history of DVT.  Polycythemia Vera Leukocytosis: Improving. Secondary to sepsis. Anemia: Hemoglobin stable. No signs of active bleeding.  P:  Trending cell counts and INR daily Heparin drip per pharmacy protocol when INR <2.0 Transfusing for Hgb <7.0 or active bleeding  INFECTIOUS A:   Sepsis Klebsiella pneumonia Ischemic right foot/cellulitis  P:   Continuing ciprofloxacin day #6/10 total Plan to reculture for fever Continuing vancomycin   ENDOCRINE A:   Hypothyroidism  P:   Synthroid via tube daily  NEUROLOGIC A:   Sedation on ventilator Pain control postoperatively  P:   RASS goal: 0 to -1  Weaning Fentanyl drip & IV bolus as needed for pain Versed IV when necessary sedation Continuing Precedex drip  Prophylaxis:   Heparin drip per  pharmacy consult. Protonix IV daily. Diet:  NPO. Restarting trickle tube feedings with instructions not to advance. Code Status:  Full Code per previous physician discussions. Disposition:  Remains critically ill.  Family Update:   no family at bedside today. Family updated yesterday during rounds.  DISCUSSION:  76 y.o. female with right leg ischemia post bypass. Acute respiratory failure multifactorial from pulmonary edema as well as pneumonia. Slow progress. Hopefully ileus will resolve.   I have spent a total of 31 minutes of critical care time today caring for the patient and reviewing the patient's electronic medical record.   Sonia Baller Ashok Cordia, M.D. [facility] Pulmonary & Critical Care Pager:  [phone] After 3pm or if no response, call [phone] 03/17/2017, 11:53 AM

## 2017-03-17 NOTE — Progress Notes (Signed)
RT NOTE:  Pt transported to CT2 and back to 2H25 without event.

## 2017-03-18 ENCOUNTER — Ambulatory Visit: Payer: Medicare Other | Admitting: Cardiovascular Disease

## 2017-03-18 LAB — GLUCOSE, CAPILLARY
Glucose-Capillary: 106 mg/dL — ABNORMAL HIGH (ref 65–99)
Glucose-Capillary: 113 mg/dL — ABNORMAL HIGH (ref 65–99)
Glucose-Capillary: 116 mg/dL — ABNORMAL HIGH (ref 65–99)
Glucose-Capillary: 118 mg/dL — ABNORMAL HIGH (ref 65–99)
Glucose-Capillary: 125 mg/dL — ABNORMAL HIGH (ref 65–99)
Glucose-Capillary: 127 mg/dL — ABNORMAL HIGH (ref 65–99)

## 2017-03-18 LAB — PHOSPHORUS: Phosphorus: 4.2 mg/dL (ref 2.5–4.6)

## 2017-03-18 LAB — BASIC METABOLIC PANEL
Anion gap: 9 (ref 5–15)
BUN: 76 mg/dL — ABNORMAL HIGH (ref 6–20)
CO2: 21 mmol/L — ABNORMAL LOW (ref 22–32)
Calcium: 8 mg/dL — ABNORMAL LOW (ref 8.9–10.3)
Chloride: 108 mmol/L (ref 101–111)
Creatinine, Ser: 1.89 mg/dL — ABNORMAL HIGH (ref 0.44–1.00)
GFR calc Af Amer: 29 mL/min — ABNORMAL LOW (ref >60)
GFR calc non Af Amer: 25 mL/min — ABNORMAL LOW (ref >60)
Glucose, Bld: 119 mg/dL — ABNORMAL HIGH (ref 65–99)
Potassium: 3.3 mmol/L — ABNORMAL LOW (ref 3.5–5.1)
Sodium: 138 mmol/L (ref 135–145)

## 2017-03-18 LAB — CBC WITH DIFFERENTIAL/PLATELET
BASOS PCT: 0 %
Basophils Absolute: 0 10*3/uL (ref 0.0–0.1)
EOS ABS: 0.4 10*3/uL (ref 0.0–0.7)
Eosinophils Relative: 2 %
HEMATOCRIT: 34 % — AB (ref 36.0–46.0)
Hemoglobin: 10.8 g/dL — ABNORMAL LOW (ref 12.0–15.0)
LYMPHS ABS: 0.8 10*3/uL (ref 0.7–4.0)
LYMPHS PCT: 4 %
MCH: 23.3 pg — ABNORMAL LOW (ref 26.0–34.0)
MCHC: 31.8 g/dL (ref 30.0–36.0)
MCV: 73.4 fL — AB (ref 78.0–100.0)
MONO ABS: 2 10*3/uL — AB (ref 0.1–1.0)
Monocytes Relative: 10 %
NEUTROS PCT: 84 %
Neutro Abs: 16.6 10*3/uL — ABNORMAL HIGH (ref 1.7–7.7)
Platelets: 380 10*3/uL (ref 150–400)
RBC: 4.63 MIL/uL (ref 3.87–5.11)
RDW: 26.1 % — AB (ref 11.5–15.5)
WBC: 19.8 10*3/uL — AB (ref 4.0–10.5)

## 2017-03-18 LAB — COMPREHENSIVE METABOLIC PANEL
ALBUMIN: 1.9 g/dL — AB (ref 3.5–5.0)
ALT: 180 U/L — ABNORMAL HIGH (ref 14–54)
AST: 47 U/L — AB (ref 15–41)
Alkaline Phosphatase: 57 U/L (ref 38–126)
Anion gap: 12 (ref 5–15)
BUN: 75 mg/dL — AB (ref 6–20)
CHLORIDE: 107 mmol/L (ref 101–111)
CO2: 19 mmol/L — AB (ref 22–32)
Calcium: 7.9 mg/dL — ABNORMAL LOW (ref 8.9–10.3)
Creatinine, Ser: 2.05 mg/dL — ABNORMAL HIGH (ref 0.44–1.00)
GFR calc Af Amer: 26 mL/min — ABNORMAL LOW (ref >60)
GFR calc non Af Amer: 23 mL/min — ABNORMAL LOW (ref >60)
GLUCOSE: 108 mg/dL — AB (ref 65–99)
POTASSIUM: 2.8 mmol/L — AB (ref 3.5–5.1)
Sodium: 138 mmol/L (ref 135–145)
Total Bilirubin: 1.2 mg/dL (ref 0.3–1.2)
Total Protein: 4.4 g/dL — ABNORMAL LOW (ref 6.5–8.1)

## 2017-03-18 LAB — PROTIME-INR
INR: 4.65 — AB
PROTHROMBIN TIME: 44.9 s — AB (ref 11.4–15.2)

## 2017-03-18 LAB — MAGNESIUM
Magnesium: 1.6 mg/dL — ABNORMAL LOW (ref 1.7–2.4)
Magnesium: 2.1 mg/dL (ref 1.7–2.4)

## 2017-03-18 MED ORDER — FENTANYL CITRATE (PF) 100 MCG/2ML IJ SOLN: 25.0000 ug | INTRAMUSCULAR | Status: DC | PRN

## 2017-03-18 MED ORDER — POTASSIUM CHLORIDE 20 MEQ/15ML (10%) PO SOLN
30.0000 meq | Freq: Once | ORAL | Status: AC
  Administered 2017-03-18: 30 meq
  Filled 2017-03-18: qty 30

## 2017-03-18 MED ORDER — DOCUSATE SODIUM 50 MG/5ML PO LIQD
100.0000 mg | Freq: Two times a day (BID) | ORAL | Status: DC
  Administered 2017-03-18 – 2017-03-20 (×4): 100 mg
  Filled 2017-03-18 (×4): qty 10

## 2017-03-18 MED ORDER — VITAMIN K1 10 MG/ML IJ SOLN
1.0000 mg | Freq: Once | INTRAMUSCULAR | Status: AC
  Administered 2017-03-18: 1 mg via INTRAVENOUS
  Filled 2017-03-18: qty 0.1

## 2017-03-18 MED ORDER — MAGNESIUM SULFATE 2 GM/50ML IV SOLN
2.0000 g | Freq: Once | INTRAVENOUS | Status: AC
  Administered 2017-03-18: 2 g via INTRAVENOUS
  Filled 2017-03-18: qty 50

## 2017-03-18 MED ORDER — AMIODARONE HCL 200 MG PO TABS
200.0000 mg | ORAL_TABLET | Freq: Every day | ORAL | Status: DC
  Administered 2017-03-18 – 2017-03-20 (×3): 200 mg
  Filled 2017-03-18 (×3): qty 1

## 2017-03-18 MED ORDER — POTASSIUM CHLORIDE 10 MEQ/50ML IV SOLN
10.0000 meq | INTRAVENOUS | Status: AC
  Administered 2017-03-18 (×3): 10 meq via INTRAVENOUS
  Filled 2017-03-18 (×3): qty 50

## 2017-03-18 NOTE — Care Management (Deleted)
CM contacted [facility] requesting discharge today to [facility] will have a bed today

## 2017-03-18 NOTE — Progress Notes (Signed)
Abdominal CT revealed no obstruction. Tube Feed restarted at trickle rate of 76ml per Dr. Elsworth Soho.

## 2017-03-18 NOTE — Progress Notes (Addendum)
Progress Note  SUBJECTIVE:    POD #7  Alert on vent. Following commands.   OBJECTIVE:   Vitals:   03/18/17 0800 03/18/17 0802  BP: 93/69   Pulse: (!) 110   Resp: 20   Temp:    SpO2: (!) 88% 94%    Intake/Output Summary (Last 24 hours) at 03/18/17 0917 Last data filed at 03/18/17 0836  Gross per 24 hour  Intake          1269.01 ml  Output             3505 ml  Net         -2235.99 ml   Alert on ventilator Abdomen soft and non tender Incisions right leg clean and intact.  Moving left toes.  Right foot warm with cyanotic right 4th and 5th toes. Appears improved from pre-op. Unable to move right toes. Unsure if sensation is intact to right foot.  Brisk right AT and PT signals  ASSESSMENT/PLAN:   76 y.o. female is s/p: fem-tp trunk bypass with in situ vein  7 Days Post-Op   Good doppler flow right foot. Still with lack of motor function right foot and cyanotic toes. Will continue to follow. Incisions clean.  Hopefully can extubate soon.   Alvia Grove 03/18/2017 9:17 AM -- LABS:   CBC    Component Value Date/Time   WBC 19.8 (H) 03/18/2017 0508   HGB 10.8 (L) 03/18/2017 0508   HCT 34.0 (L) 03/18/2017 0508   PLT 380 03/18/2017 0508    BMET    Component Value Date/Time   NA 138 03/18/2017 0752   K 2.8 (L) 03/18/2017 0752   CL 107 03/18/2017 0752   CO2 19 (L) 03/18/2017 0752   GLUCOSE 108 (H) 03/18/2017 0752   BUN 75 (H) 03/18/2017 0752   CREATININE 2.05 (H) 03/18/2017 0752   CREATININE 1.21 (H) 08/14/2016 0733   CALCIUM 7.9 (L) 03/18/2017 0752   GFRNONAA 23 (L) 03/18/2017 0752   GFRAA 26 (L) 03/18/2017 0752    COAG Lab Results  Component Value Date   INR 4.65 (HH) 03/18/2017   INR 3.98 03/17/2017   INR 3.33 03/16/2017   No results found for: PTT  ANTIBIOTICS:   Anti-infectives    Start     Dose/Rate Route Frequency Ordered Stop   03/17/17 1100  vancomycin (VANCOCIN) IVPB 750 mg/150 ml premix     750 mg 150 mL/hr over 60 Minutes  Intravenous Every 24 hours 03/16/17 0929     03/16/17 1000  ciprofloxacin (CIPRO) IVPB 400 mg     400 mg 200 mL/hr over 60 Minutes Intravenous Every 24 hours 03/16/17 0858     03/16/17 1000  vancomycin (VANCOCIN) 2,000 mg in sodium chloride 0.9 % 500 mL IVPB     2,000 mg 250 mL/hr over 120 Minutes Intravenous  Once 03/16/17 0929 03/16/17 1250   03/15/17 1000  cefTRIAXone (ROCEPHIN) 1 g in dextrose 5 % 50 mL IVPB  Status:  Discontinued     1 g 100 mL/hr over 30 Minutes Intravenous Every 24 hours 03/14/17 1054 03/16/17 0841   03/14/17 1100  cefTRIAXone (ROCEPHIN) 1 g in dextrose 5 % 50 mL IVPB  Status:  Discontinued     1 g 100 mL/hr over 30 Minutes Intravenous Every 24 hours 03/14/17 1054 03/14/17 1054   03/13/17 1100  vancomycin (VANCOCIN) 1,250 mg in sodium chloride 0.9 % 250 mL IVPB  Status:  Discontinued     1,250 mg 166.7  mL/hr over 90 Minutes Intravenous Every 24 hours 03/12/17 0937 03/13/17 0741   03/13/17 1100  vancomycin (VANCOCIN) IVPB 1000 mg/200 mL premix  Status:  Discontinued     1,000 mg 200 mL/hr over 60 Minutes Intravenous Every 24 hours 03/13/17 0741 03/14/17 1053   03/12/17 1000  ceFEPIme (MAXIPIME) 2 g in dextrose 5 % 50 mL IVPB  Status:  Discontinued     2 g 100 mL/hr over 30 Minutes Intravenous Every 24 hours 03/12/17 0931 03/14/17 1053   03/12/17 1000  vancomycin (VANCOCIN) 1,750 mg in sodium chloride 0.9 % 500 mL IVPB     1,750 mg 250 mL/hr over 120 Minutes Intravenous  Once 03/12/17 0931 03/12/17 1210   03/11/17 2000  cefUROXime (ZINACEF) 1.5 g in dextrose 5 % 50 mL IVPB     1.5 g 100 mL/hr over 30 Minutes Intravenous Every 12 hours 03/11/17 1955 03/12/17 0930   03/11/17 1245  cefUROXime (ZINACEF) 1.5 g in dextrose 5 % 50 mL IVPB     1.5 g 100 mL/hr over 30 Minutes Intravenous On call to O.R. 03/11/17 1244 03/11/17 [place], PA-C [facility] Office: [phone] Pager: [phone] 03/18/2017 9:17 AM   I have  independently interviewed and examined patient and agree with PA assessment and plan above. Will need to get better sensation and motor exam when more awake.   Brandon C. [name], MD [facility] [place] Office: [phone] Pager: [phone]

## 2017-03-18 NOTE — Progress Notes (Signed)
ANTICOAGULATION CONSULT NOTE - Follow Up Consult  Pharmacy Consult for Heparin Indication: Afib  No Known Allergies  Patient Measurements:  Heparin Dosing Weight: 79 kg  Vital Signs: Temp: 97.8 F (36.6 C) (08/15 0731) Temp Source: Axillary (08/15 0731) BP: 93/69 (08/15 0800) Pulse Rate: 110 (08/15 0800)  Labs:  Recent Labs  03/16/17 0356 03/17/17 0528 03/17/17 1230 03/18/17 0508 03/18/17 0752  HGB 10.3* 10.8*  --  10.8*  --   HCT 31.3* 33.9*  --  34.0*  --   PLT 677* 593*  --  380  --   LABPROT 34.6* 41.8*  --  44.9*  --   INR 3.33 3.98  --  4.65*  --   CREATININE 2.32*  --  2.22*  --  2.05*    Estimated Creatinine Clearance: 30.7 mL/min (A) (by C-G formula based on SCr of 2.05 mg/dL (H)).   Assessment: 76 yo F presents with leg pain and known chronic occlusion. Pt is on warfarin PTA for AFib that was bridged with IV heparin pending procedures. INR then trended up despite holding warfarin throughout admit and giving vitamin K 8/2, so heparin infusion was turned off.   INR remains supratherapeutic at 4.65 this morning. Hgb stable, pltc trending down.  Goal of Therapy:  Heparin level 0.3-0.7 units/ml Monitor platelets by anticoagulation protocol: Yes   Plan:  -Continue to hold heparin - F/U with [facility] prior to resuming if INR trends down -Monitor INR, S/Sx bleeding daily  Arrie Senate, PharmD PGY-2 Cardiology Pharmacy Resident Pager: [phone] 03/18/2017

## 2017-03-18 NOTE — Progress Notes (Signed)
PULMONARY / CRITICAL CARE MEDICINE   Name: Charlotte [name] MRN: [id number] DOB: 06/06/1941    ADMISSION DATE:  03/04/2017   CONSULTATION DATE:  03/11/2017  REFERRING MD:  Windell Moulding  CHIEF COMPLAINT:  Leg pain  BRIEF HPI:  76 y.o.   female with known atrial fibrillation, polycythemia, and peripheral vascular disease. Patient also has known congestive heart failure and is on chronic oxygen therapy. Admitted on 8/1 with limb ischemia and subsequently pain. Underwent bypass on 8/8 and return to the intensive care unit intubated.  SUBJECTIVE:  Continuing pressure support wean. Nausea and vomiting again overnight prompting CT imaging.  REVIEW OF SYSTEMS:  Unable to obtain secondary to intubation and sedation.  VITAL SIGNS: BP 93/69   Pulse (!) 110   Temp 97.8 F (36.6 C) (Axillary)   Resp 20   Ht 6' (1.829 m)   Wt 203 lb 4.2 oz (92.2 kg)   SpO2 94%   BMI 27.57 kg/m   HEMODYNAMICS:    VENTILATOR SETTINGS: Vent Mode: PRVC FiO2 (%):  [30 %] 30 % Set Rate:  [20 bmp] 20 bmp Vt Set:  [600 mL] 600 mL PEEP:  [5 cmH20] 5 cmH20 Plateau Pressure:  [15 cmH20-17 cmH20] 15 cmH20  INTAKE / OUTPUT: I/O last 3 completed shifts: In: 1712.2 [I.V.:1307.5; NG/GT:54.7; IV Piggyback:350] Out: 0867 [Urine:3610; Emesis/NG output:480; Stool:50]  PHYSICAL EXAMINATION: General:  No family at bedside. Awake. Alert.  Integument:  No rash. Warm. Dry. Extremities: Continued cyanosis of right foot. HEENT:  Endotracheal tube in place. No scleral icterus. Moist membranes. Cardiovascular:  Irregular rhythm. Regular rate. Unable to appreciate JVD. Pulmonary:  Slightly diminished breath sounds bilaterally. Otherwise good aeration. Symmetric chest wall rise on ventilator. Abdomen: Good bowel sounds. Soft. Mildly protuberant. Neurological: Following commands. Nods to questions. Moving all 4 extremities equally.  LABS:  BMET  Recent Labs Lab 03/16/17 0356 03/17/17 1230 03/18/17 0752  NA 136 137  138  K 3.1* 3.0* 2.8*  CL 107 108 107  CO2 16* 19* 19*  BUN 74* 77* 75*  CREATININE 2.32* 2.22* 2.05*  GLUCOSE 120* 123* 108*    Electrolytes  Recent Labs Lab 03/16/17 0356 03/17/17 1230 03/18/17 0752  CALCIUM 8.0* 7.9* 7.9*  MG 1.7 1.8 1.6*  PHOS 3.7 4.4 4.2    CBC  Recent Labs Lab 03/16/17 0356 03/17/17 0528 03/18/17 0508  WBC 27.1* 23.9* 19.8*  HGB 10.3* 10.8* 10.8*  HCT 31.3* 33.9* 34.0*  PLT 677* 593* 380    Coag's  Recent Labs Lab 03/16/17 0356 03/17/17 0528 03/18/17 0508  INR 3.33 3.98 4.65*    Sepsis Markers  Recent Labs Lab 03/11/17 2343  03/12/17 1030 03/12/17 1553 03/13/17 0308 03/14/17 0418  LATICACIDVEN 0.9  --  3.0* 1.5  --   --   PROCALCITON  --   < > 0.75  --  5.08 8.73  < > = values in this interval not displayed.  ABG  Recent Labs Lab 03/13/17 1553 03/14/17 0424 03/16/17 1031  PHART 7.396 7.338* 7.438  PCO2ART 23.7* 25.0* 24.1*  PO2ART 68.0* 73.0* 125.0*    Liver Enzymes  Recent Labs Lab 03/16/17 0356 03/17/17 1230 03/18/17 0752  AST 183* 103* 47*  ALT 272* 244* 180*  ALKPHOS 91 68 57  BILITOT 1.2 1.3* 1.2  ALBUMIN 1.7* 1.8* 1.9*    Cardiac Enzymes  Recent Labs Lab 03/12/17 1030  TROPONINI 0.16*    Glucose  Recent Labs Lab 03/17/17 1113 03/17/17 1617 03/17/17 1936 03/17/17 2330 03/18/17  0338 03/18/17 0723  GLUCAP 99 104* 99 102* 113* 106*    Imaging Dg Chest Port 1 View  Result Date: 03/15/2017 CLINICAL DATA:  Acute respiratory failure. EXAM: PORTABLE CHEST 1 VIEW COMPARISON:  One-view chest x-ray the one-view chest x-ray 03/14/2017 FINDINGS: Endotracheal tube is stable, 5 cm above the carina. Side port of the NG tube is just above the GE junction and could be advanced for more optimal positioning. A left subclavian line is stable. Cardiac enlargement, mild edema, and bilateral effusions are again noted. Bibasilar airspace disease is present, likely reflecting atelectasis. There is no  significant interval change. IMPRESSION: 1. Stable cardiomegaly, mild edema, and pleural effusions compatible with congestive heart failure. 2. Support apparatus is stable. 3. The side port of the NG tube is likely just above the GE junction and could be advanced 6-10 cm for more optimal positioning. Electronically Signed   By: San Morelle M.D.   On: 03/15/2017 07:25     IMAGING/STUDIES: PFT 08/30/13: FVC 3.57 L (95%) FEV1 2.38 L (83%) FEV1/FVC 0.66 FEF 25-75 1.34 L (60%) negative bronchodilator response TLC 101% RV 105% DLCO uncorrected 34% RENAL U/S 8/6:   IMPRESSION: 1. No evidence of hydronephrosis. 2. Mildly increased renal parenchymal echogenicity may reflect medical renal disease. TTE 8/10:  LV normal in size with EF 65-70%. No regional wall motion abnormalities. Unable to assess diastolic function due to atrial fibrillation. LA & RA severely dilated. RV moderately dilated with moderate to severely reduced systolic function. No aortic stenosis or regurgitation. Aortic root normal in size. Mild mitral regurgitation without stenosis. Ventricular septum with diastolic flattening and systolic flattening. Moderate tricuspid regurgitation. No pulmonic stenosis or regurgitation. Trivial pericardial effusion. PORT CXR 8/11:  Previously reviewed by me. Endotracheal tube and central  catheter in good position. Enteric feeding tube coursing low diaphragm. Hazy bilateral lower lung opacification consistent with pleural effusions. PORT ABD X-RAY 8/12: IMPRESSION: 1. Nasogastric tube tip in the proximal stomach and side hole in the region of the gastroesophageal junction. 2. Curvilinear density in the inferior pelvis in the expected position of the urinary bladder, most likely representing a Foley catheter. A rectal tube is also a possibility. 3. Cardiomegaly and bilateral pleural effusions. PORT CXR 8/12:  Pereviously reviewed by me. Persistent bilateral lower lung hazy opacification consistent  with pleural effusion. Endotracheal tube approximately 2 cm too high. Enteric feeding tube with side-port at gastroesophageal junction. RUQ U/S 8/13: IMPRESSION: 1. Biliary sludge and small gallstones within the gallbladder lumen. 2. No evidence of biliary obstruction. 3. Liver parenchyma mildly heterogeneous without evidence of significant steatosis or cirrhosis. A small amount of ascites is present as well as a right pleural effusion. PORT CXR 8/14:  Previously reviewed by me. Endotracheal tube and central venous catheter in good position. Enteric feeding tube coursing low diaphragm. Continue to hazy bilateral lower lung opacities consistent with pleural effusions. CT ABD/PELVIS W/O CONTRAST 8/14: IMPRESSION: 1. No explanation for bilious vomiting.  No bowel obstruction. 2. Findings consistent with fluid overload with body wall edema, third-spacing, small amount of abdominopelvic ascites and moderate to large bilateral pleural effusions. 3. Cortical nephrocalcinosis. 4. Gallstones/sludge. 5. Advanced aortic atherosclerosis.  MICROBIOLOGY: MRSA PCR 8/3:  Negative  Tracheal Aspirate Culture 8/8:  Klebsiella pneumoniae Tracheal Aspirate Culture 8/9:  Klebsiella pneumoniae  ANTIBIOTICS: Cefuroxime 8/8 (periop) Cefepime 8/9 - 8/11 Rocephin 8/11 - 8/12 Vancomycin 8/9 - 8/10; Vancomycin 8/13 >>> Cipro 8/13>>>  SIGNIFICANT EVENTS: 08/01 - Admit 08/08 - RLE Fem-Pop bypass & returned to  ICU intubated 08/09 - On Neo-Synephrine w/ oliguria & increasing oxygen needs 08/12 - N/V overnight >> tube feedings on hold 08/13 - Changed from Rocephin to Cipro w/ increasing LFTs 08/15 - Vitamin K 1mg  IV for INR >4.0  LINES/TUBES: OETT 8/8 >>> L [place] CVL 8/9 >>> R RADIAL ART LINE 8/8 >>> Foley >>> OGT >>> PIV  ASSESSMENT / PLAN:  PULMONARY A: Acute on chronic hypoxic respiratory failure: Multifactorial from pneumonia, pleural effusions, and pulmonary edema. Bilateral pleural effusions:  Deferring thoracentesis given coagulopathy.    P:   Continuing daily spontaneous breathing trial/pressure support wean Pulse oximetry monitoring Intermittent chest x-ray Deferring thoracentesis based on coagulopathy  CARDIOVASCULAR A:  Shock: Likely multifactorial but primarily sepsis. Improving. Atrial fibrillation Acute diastolic congestive heart failure Limb Ischemia:  S/P bypass.  P:  Cardiology following & appreciate assistance Continuing Lasix diuresis Continuing amiodarone drip Continuous telemetry monitoring Vitals per unit protocol Vascular surgery following & guided postop care  RENAL A:   Hypokalemia: Replaced. Hypomagnesemia: Replaced.  Acute on chronic renal failure: Slowly improving. Metabolic acidosis:  Stable.   P:   KCl 30 mEq via tube KCl 10 mEq IV 3 rounds Magnesium sulfate 2 g IV Repeat electrolytes at 6 PM Monitoring urine output with Foley catheter Trending renal function and electrolytes daily   GASTROINTESTINAL A:   Transaminitis: Likely secondary to shock liver. Improving.  Nausea with vomiting: Unclear etiology. Possibly secondary to transaminitis.   P:   Trending LFTs daily Nothing by mouth   HEMATOLOGIC A:   Coagulopathy: Continuing to worsen. Known history of chronic Coumadin. Polycythemia Vera Leukocytosis: Steadily improving. Likely secondary to sepsis. Anemia: Hemoglobin stable. No evidence of active bleeding.Marland Kitchen History of DVT  P:  Vitamin K 1 mg IV today Trending cell counts and INR daily Heparin drip per pharmacy protocol when INR <2.0 Transfusing for Hgb <7.0 or active bleeding  INFECTIOUS A:   Sepsis Klebsiella pneumonia Ischemic right foot/cellulitis  P:   Continuing ciprofloxacin day #7/10 total Plan to repeat culture for any fever Continuing empiric vancomycin for cellulitis  ENDOCRINE A:   Hypothyroidism  P:   Synthroid via tube daily  NEUROLOGIC A:   Sedation on ventilator Pain control  postoperatively  P:   RASS goal: 0 to -1  Weaning Fentanyl drip & IV bolus as needed for pain Versed IV when necessary sedation Continuing to hold fentanyl drip  Prophylaxis:   Heparin drip per pharmacy consult. Protonix IV daily. Diet:  NPO. Restarting trickle tube feedings with instructions not to advance. Code Status:  Full Code per previous physician discussions. Disposition:  Remains critically ill.  Family Update: No family at bedside today.  DISCUSSION:  76 y.o.  female with right leg ischemia post bypass. Respiratory failure slowly improving. Restarting gentle trickle tube feedings. Holding on thoracentesis given coagulopathy.   I have spent a total of 33 minutes of critical care time today caring for the patient and reviewing the patient's electronic medical record.   Sonia Baller Ashok Cordia, M.D. [facility] Pulmonary & Critical Care Pager:  [phone] After 3pm or if no response, call [phone] 03/18/2017, 10:38 AM

## 2017-03-18 NOTE — Progress Notes (Signed)
[place] Progress Note Patient Name: DAWT REEB DOB: Feb 10, 1941 MRN: [id number]   Date of Service  03/18/2017  HPI/Events of Note  K+ = 3.3, Mg++ = 2.1 and Creatinine =2.1.  eICU Interventions  Will replace K+.     Intervention Category Major Interventions: Electrolyte abnormality - evaluation and management  Estefanny Moler [place] 03/18/2017, 9:48 PM

## 2017-03-18 NOTE — Progress Notes (Addendum)
Chaplain stopped in to visit with patient.  Patient intubated and not awake.  No family in room.  Chaplain is available should nurse or medical staff feel that this patient or family can benefit from spiritual/pastoral care support.    03/18/17 1542  Clinical Encounter Type  Visited With Patient not available  Visit Type Psychological support;Spiritual support;Social support

## 2017-03-19 LAB — GLUCOSE, CAPILLARY
GLUCOSE-CAPILLARY: 120 mg/dL — AB (ref 65–99)
Glucose-Capillary: 102 mg/dL — ABNORMAL HIGH (ref 65–99)
Glucose-Capillary: 103 mg/dL — ABNORMAL HIGH (ref 65–99)
Glucose-Capillary: 103 mg/dL — ABNORMAL HIGH (ref 65–99)
Glucose-Capillary: 105 mg/dL — ABNORMAL HIGH (ref 65–99)
Glucose-Capillary: 113 mg/dL — ABNORMAL HIGH (ref 65–99)

## 2017-03-19 LAB — COMPREHENSIVE METABOLIC PANEL
ALBUMIN: 2.1 g/dL — AB (ref 3.5–5.0)
ALT: 134 U/L — ABNORMAL HIGH (ref 14–54)
ANION GAP: 12 (ref 5–15)
AST: 50 U/L — ABNORMAL HIGH (ref 15–41)
Alkaline Phosphatase: 67 U/L (ref 38–126)
BUN: 79 mg/dL — ABNORMAL HIGH (ref 6–20)
CO2: 19 mmol/L — AB (ref 22–32)
Calcium: 7.9 mg/dL — ABNORMAL LOW (ref 8.9–10.3)
Chloride: 108 mmol/L (ref 101–111)
Creatinine, Ser: 1.79 mg/dL — ABNORMAL HIGH (ref 0.44–1.00)
GFR calc Af Amer: 31 mL/min — ABNORMAL LOW (ref >60)
GFR calc non Af Amer: 27 mL/min — ABNORMAL LOW (ref >60)
GLUCOSE: 103 mg/dL — AB (ref 65–99)
Potassium: 5.1 mmol/L (ref 3.5–5.1)
Sodium: 139 mmol/L (ref 135–145)
Total Bilirubin: 1.6 mg/dL — ABNORMAL HIGH (ref 0.3–1.2)
Total Protein: 5 g/dL — ABNORMAL LOW (ref 6.5–8.1)

## 2017-03-19 LAB — CBC WITH DIFFERENTIAL/PLATELET
BASOS ABS: 0 10*3/uL (ref 0.0–0.1)
BASOS PCT: 0 %
Eosinophils Absolute: 0.5 10*3/uL (ref 0.0–0.7)
Eosinophils Relative: 2 %
HCT: 35.9 % — ABNORMAL LOW (ref 36.0–46.0)
HEMOGLOBIN: 10.8 g/dL — AB (ref 12.0–15.0)
LYMPHS PCT: 4 %
Lymphs Abs: 1.1 10*3/uL (ref 0.7–4.0)
MCH: 22.9 pg — AB (ref 26.0–34.0)
MCHC: 30.1 g/dL (ref 30.0–36.0)
MCV: 76.1 fL — ABNORMAL LOW (ref 78.0–100.0)
MONOS PCT: 9 %
Monocytes Absolute: 2.4 10*3/uL — ABNORMAL HIGH (ref 0.1–1.0)
NEUTROS ABS: 22.4 10*3/uL — AB (ref 1.7–7.7)
Neutrophils Relative %: 85 %
Platelets: 387 10*3/uL (ref 150–400)
RBC: 4.72 MIL/uL (ref 3.87–5.11)
RDW: 27.4 % — ABNORMAL HIGH (ref 11.5–15.5)
WBC: 26.4 10*3/uL — ABNORMAL HIGH (ref 4.0–10.5)

## 2017-03-19 LAB — MAGNESIUM: MAGNESIUM: 2.1 mg/dL (ref 1.7–2.4)

## 2017-03-19 LAB — BASIC METABOLIC PANEL
Anion gap: 10 (ref 5–15)
BUN: 76 mg/dL — ABNORMAL HIGH (ref 6–20)
CO2: 23 mmol/L (ref 22–32)
Calcium: 8 mg/dL — ABNORMAL LOW (ref 8.9–10.3)
Chloride: 108 mmol/L (ref 101–111)
Creatinine, Ser: 1.73 mg/dL — ABNORMAL HIGH (ref 0.44–1.00)
GFR calc Af Amer: 32 mL/min — ABNORMAL LOW (ref >60)
GFR calc non Af Amer: 28 mL/min — ABNORMAL LOW (ref >60)
Glucose, Bld: 102 mg/dL — ABNORMAL HIGH (ref 65–99)
Potassium: 2.7 mmol/L — CL (ref 3.5–5.1)
Sodium: 141 mmol/L (ref 135–145)

## 2017-03-19 LAB — HEPARIN LEVEL (UNFRACTIONATED)

## 2017-03-19 LAB — PHOSPHORUS: Phosphorus: 4.2 mg/dL (ref 2.5–4.6)

## 2017-03-19 LAB — PROTIME-INR
INR: 1.42
PROTHROMBIN TIME: 17.5 s — AB (ref 11.4–15.2)

## 2017-03-19 LAB — VANCOMYCIN, TROUGH: VANCOMYCIN TR: 31 ug/mL — AB (ref 15–20)

## 2017-03-19 MED ORDER — POTASSIUM CHLORIDE 10 MEQ/50ML IV SOLN
10.0000 meq | INTRAVENOUS | Status: AC
  Administered 2017-03-19 – 2017-03-20 (×4): 10 meq via INTRAVENOUS
  Filled 2017-03-19 (×4): qty 50

## 2017-03-19 MED ORDER — HEPARIN (PORCINE) IN NACL 100-0.45 UNIT/ML-% IJ SOLN
1750.0000 [IU]/h | INTRAMUSCULAR | Status: DC
  Administered 2017-03-19: 1400 [IU]/h via INTRAVENOUS
  Administered 2017-03-20: 1750 [IU]/h via INTRAVENOUS
  Administered 2017-03-20: 1600 [IU]/h via INTRAVENOUS
  Administered 2017-03-21 – 2017-03-23 (×4): 1750 [IU]/h via INTRAVENOUS
  Filled 2017-03-19 (×7): qty 250

## 2017-03-19 MED ORDER — VITAL AF 1.2 CAL PO LIQD
1000.0000 mL | ORAL | Status: DC
  Administered 2017-03-19: 1000 mL

## 2017-03-19 MED ORDER — POTASSIUM CHLORIDE 20 MEQ/15ML (10%) PO SOLN
40.0000 meq | Freq: Once | ORAL | Status: AC
  Administered 2017-03-19: 40 meq
  Filled 2017-03-19: qty 30

## 2017-03-19 MED ORDER — POTASSIUM CHLORIDE 10 MEQ/100ML IV SOLN: 10.0000 meq | INTRAVENOUS | Status: DC

## 2017-03-19 MED ORDER — PANTOPRAZOLE SODIUM 40 MG PO PACK
40.0000 mg | PACK | Freq: Every day | ORAL | Status: DC
  Administered 2017-03-20: 40 mg
  Filled 2017-03-19: qty 20

## 2017-03-19 NOTE — Progress Notes (Signed)
ANTICOAGULATION CONSULT NOTE - Follow Up Consult  Pharmacy Consult for Heparin Indication: Afib  No Known Allergies  Patient Measurements:  Heparin Dosing Weight: 86.7kg  Vital Signs: Temp: 98.5 F (36.9 C) (08/16 0407) Temp Source: Oral (08/16 0407) BP: 127/79 (08/16 0735) Pulse Rate: 130 (08/16 0735)  Labs:  Recent Labs  03/17/17 0528  03/18/17 0508 03/18/17 0752 03/18/17 1919 03/19/17 0422  HGB 10.8*  --  10.8*  --   --  10.8*  HCT 33.9*  --  34.0*  --   --  35.9*  PLT 593*  --  380  --   --  387  LABPROT 41.8*  --  44.9*  --   --  17.5*  INR 3.98  --  4.65*  --   --  1.42  CREATININE  --   < >  --  2.05* 1.89* 1.79*  < > = values in this interval not displayed.  Estimated Creatinine Clearance: 31.8 mL/min (A) (by C-G formula based on SCr of 1.79 mg/dL (H)).   Assessment: 81 yoF presents with leg pain and known chronic occlusion. Pt is on warfarin PTA for AFib that was bridged with IV heparin pending procedures. INR then trended up despite holding warfarin throughout admit and giving vitamin K 8/2, so heparin infusion was turned off.   INR now subtherapeutic at 1.42 today s/p IV vitamin K yesterday. Will initiate heparin drip with no bolus. Earlier in admit heparin was therapeutic at 1700 units/hr but will start slightly lower given critical illness. CBC stable, no S/Sx bleeding per RN.  Goal of Therapy:  Heparin level 0.3-0.7 units/ml Monitor platelets by anticoagulation protocol: Yes   Plan:  -Initiate heparin at 1400 units/hr -Check 8-hr heparin level -Monitor heparin level, CBC, S/Sx bleeding daily  Arrie Senate, PharmD PGY-2 Cardiology Pharmacy Resident Pager: [phone] 03/19/2017

## 2017-03-19 NOTE — Progress Notes (Signed)
PULMONARY / CRITICAL CARE MEDICINE   Name: Charlotte [name] MRN: [id number] DOB: April 08, 1941    ADMISSION DATE:  03/04/2017   CONSULTATION DATE:  03/11/2017  REFERRING MD:  Windell Moulding  CHIEF COMPLAINT:  Leg pain  BRIEF HPI:  76 y.o.   female with known atrial fibrillation, polycythemia, and peripheral vascular disease. Patient also has known congestive heart failure and is on chronic oxygen therapy. Admitted on 8/1 with limb ischemia and subsequently pain. Underwent bypass on 8/8 and return to the intensive care unit intubated.  SUBJECTIVE:  TOl PS 8/5 but c/o SOB.     VITAL SIGNS: BP 124/84   Pulse (!) 129   Temp 98.1 F (36.7 C) (Oral)   Resp 19   Ht 6' (1.829 m)   Wt 86.7 kg (191 lb 2.2 oz)   SpO2 97%   BMI 25.92 kg/m   HEMODYNAMICS:    VENTILATOR SETTINGS: Vent Mode: PSV;CPAP FiO2 (%):  [30 %] 30 % Set Rate:  [16 bmp-20 bmp] 16 bmp Vt Set:  [600 mL] 600 mL PEEP:  [5 cmH20] 5 cmH20 Pressure Support:  [8 cmH20] 8 cmH20 Plateau Pressure:  [12 cmH20-17 cmH20] 17 cmH20  INTAKE / OUTPUT: I/O last 3 completed shifts: In: 1368.8 [I.V.:704.1; NG/GT:314.7; IV Piggyback:350] Out: 7260 [Urine:6860; Stool:400]  PHYSICAL EXAMINATION: General:  No family at bedside. Integument:  No rash. Warm. Dry. Extremities: Continued cyanosis of right foot. HEENT:  Endotracheal tube in place. No scleral icterus. Moist membranes. Cardiovascular:  Irregular rhythm. Regular rate. Unable to appreciate JVD. Pulmonary:  Slightly diminished breath sounds bilaterally. resps even non labored on PS 8/5.  Abdomen: Good bowel sounds. Soft. Mildly protuberant. Neurological:  Awake. Alert. Nods appropriately.  Following commands.  LABS:  BMET  Recent Labs Lab 03/18/17 0752 03/18/17 1919 03/19/17 0422  NA 138 138 139  K 2.8* 3.3* 5.1  CL 107 108 108  CO2 19* 21* 19*  BUN 75* 76* 79*  CREATININE 2.05* 1.89* 1.79*  GLUCOSE 108* 119* 103*    Electrolytes  Recent Labs Lab  03/17/17 1230 03/18/17 0752 03/18/17 1919 03/19/17 0422  CALCIUM 7.9* 7.9* 8.0* 7.9*  MG 1.8 1.6* 2.1 2.1  PHOS 4.4 4.2  --  4.2    CBC  Recent Labs Lab 03/17/17 0528 03/18/17 0508 03/19/17 0422  WBC 23.9* 19.8* 26.4*  HGB 10.8* 10.8* 10.8*  HCT 33.9* 34.0* 35.9*  PLT 593* 380 387    Coag's  Recent Labs Lab 03/17/17 0528 03/18/17 0508 03/19/17 0422  INR 3.98 4.65* 1.42    Sepsis Markers  Recent Labs Lab 03/12/17 1030 03/12/17 1553 03/13/17 0308 03/14/17 0418  LATICACIDVEN 3.0* 1.5  --   --   PROCALCITON 0.75  --  5.08 8.73    ABG  Recent Labs Lab 03/13/17 1553 03/14/17 0424 03/16/17 1031  PHART 7.396 7.338* 7.438  PCO2ART 23.7* 25.0* 24.1*  PO2ART 68.0* 73.0* 125.0*    Liver Enzymes  Recent Labs Lab 03/17/17 1230 03/18/17 0752 03/19/17 0422  AST 103* 47* 50*  ALT 244* 180* 134*  ALKPHOS 68 57 67  BILITOT 1.3* 1.2 1.6*  ALBUMIN 1.8* 1.9* 2.1*    Cardiac Enzymes  Recent Labs Lab 03/12/17 1030  TROPONINI 0.16*    Glucose  Recent Labs Lab 03/18/17 0723 03/18/17 1206 03/18/17 1551 03/18/17 1934 03/18/17 2347 03/19/17 0342  GLUCAP 106* 118* 125* 116* 127* 120*    Imaging Dg Chest Port 1 View  Result Date: 03/15/2017 CLINICAL DATA:  Acute respiratory failure.  EXAM: PORTABLE CHEST 1 VIEW COMPARISON:  One-view chest x-ray the one-view chest x-ray 03/14/2017 FINDINGS: Endotracheal tube is stable, 5 cm above the carina. Side port of the NG tube is just above the GE junction and could be advanced for more optimal positioning. A left subclavian line is stable. Cardiac enlargement, mild edema, and bilateral effusions are again noted. Bibasilar airspace disease is present, likely reflecting atelectasis. There is no significant interval change. IMPRESSION: 1. Stable cardiomegaly, mild edema, and pleural effusions compatible with congestive heart failure. 2. Support apparatus is stable. 3. The side port of the NG tube is likely just above  the GE junction and could be advanced 6-10 cm for more optimal positioning. Electronically Signed   By: San Morelle M.D.   On: 03/15/2017 07:25     IMAGING/STUDIES: PFT 08/30/13: FVC 3.57 L (95%) FEV1 2.38 L (83%) FEV1/FVC 0.66 FEF 25-75 1.34 L (60%) negative bronchodilator response TLC 101% RV 105% DLCO uncorrected 34% RENAL U/S 8/6:   IMPRESSION: 1. No evidence of hydronephrosis. 2. Mildly increased renal parenchymal echogenicity may reflect medical renal disease. TTE 8/10:  LV normal in size with EF 65-70%. No regional wall motion abnormalities. Unable to assess diastolic function due to atrial fibrillation. LA & RA severely dilated. RV moderately dilated with moderate to severely reduced systolic function. No aortic stenosis or regurgitation. Aortic root normal in size. Mild mitral regurgitation without stenosis. Ventricular septum with diastolic flattening and systolic flattening. Moderate tricuspid regurgitation. No pulmonic stenosis or regurgitation. Trivial pericardial effusion. PORT CXR 8/11:  Previously reviewed by me. Endotracheal tube and central  catheter in good position. Enteric feeding tube coursing low diaphragm. Hazy bilateral lower lung opacification consistent with pleural effusions. PORT ABD X-RAY 8/12: IMPRESSION: 1. Nasogastric tube tip in the proximal stomach and side hole in the region of the gastroesophageal junction. 2. Curvilinear density in the inferior pelvis in the expected position of the urinary bladder, most likely representing a Foley catheter. A rectal tube is also a possibility. 3. Cardiomegaly and bilateral pleural effusions. PORT CXR 8/12:  Pereviously reviewed by me. Persistent bilateral lower lung hazy opacification consistent with pleural effusion. Endotracheal tube approximately 2 cm too high. Enteric feeding tube with side-port at gastroesophageal junction. RUQ U/S 8/13: IMPRESSION: 1. Biliary sludge and small gallstones within the gallbladder  lumen. 2. No evidence of biliary obstruction. 3. Liver parenchyma mildly heterogeneous without evidence of significant steatosis or cirrhosis. A small amount of ascites is present as well as a right pleural effusion. PORT CXR 8/14:  Previously reviewed by me. Endotracheal tube and central venous catheter in good position. Enteric feeding tube coursing low diaphragm. Continue to hazy bilateral lower lung opacities consistent with pleural effusions. CT ABD/PELVIS W/O CONTRAST 8/14: IMPRESSION: 1. No explanation for bilious vomiting.  No bowel obstruction. 2. Findings consistent with fluid overload with body wall edema, third-spacing, small amount of abdominopelvic ascites and moderate to large bilateral pleural effusions. 3. Cortical nephrocalcinosis. 4. Gallstones/sludge. 5. Advanced aortic atherosclerosis.  MICROBIOLOGY: MRSA PCR 8/3:  Negative  Tracheal Aspirate Culture 8/8:  Klebsiella pneumoniae Tracheal Aspirate Culture 8/9:  Klebsiella pneumoniae  ANTIBIOTICS: Cefuroxime 8/8 (periop) Cefepime 8/9 - 8/11 Rocephin 8/11 - 8/12 Vancomycin 8/9 - 8/10; Vancomycin 8/13 >>> Cipro 8/13>>>  SIGNIFICANT EVENTS: 08/01 - Admit 08/08 - RLE Fem-Pop bypass & returned to ICU intubated 08/09 - On Neo-Synephrine w/ oliguria & increasing oxygen needs 08/12 - N/V overnight >> tube feedings on hold 08/13 - Changed from Rocephin to Cipro w/ increasing  LFTs 08/15 - Vitamin K 1mg  IV for INR >4.0  LINES/TUBES: OETT 8/8 >>> L [place] CVL 8/9 >>> R RADIAL ART LINE 8/8 >>>out Foley >>> OGT >>> PIV  ASSESSMENT / PLAN:  PULMONARY A: Acute on chronic hypoxic respiratory failure: Multifactorial from pneumonia, pleural effusions, and pulmonary edema. Bilateral pleural effusions: Deferring thoracentesis given coagulopathy.    P:   Continuing daily spontaneous breathing trial/pressure support wean - approaching D8 vent - if continues to wean may need trial at extubation v discussion early trach   Pulse oximetry monitoring Intermittent f/u chest x-ray Deferring thoracentesis based on coagulopathy Continue diuresis as BP and Scr tol   CARDIOVASCULAR A:  Shock: Likely multifactorial but primarily sepsis. Improving. Atrial fibrillation Acute diastolic congestive heart failure Limb Ischemia:  S/P bypass.  P:  Cardiology following & appreciate assistance Continuing Lasix diuresis Continuing amiodarone drip Continuous telemetry monitoring Vitals per unit protocol Vascular surgery following & guided postop care  RENAL A:   Hypokalemia: Replaced. Hypomagnesemia: Replaced.  Acute on chronic renal failure: Slowly improving. Metabolic acidosis:  Stable.   P:   Recheck chem this afternoon and in am  Monitoring urine output with Foley catheter Trending renal function and electrolytes daily   GASTROINTESTINAL A:   Transaminitis: Likely secondary to shock liver. Improving.  Nausea with vomiting: Unclear etiology. Possibly secondary to transaminitis.   P:   Trending LFTs daily Trickle feeds   HEMATOLOGIC A:   Coagulopathy: Continuing to worsen. Known history of chronic Coumadin. Polycythemia Vera Leukocytosis: Steadily improving. Likely secondary to sepsis. Anemia: Hemoglobin stable. No evidence of active bleeding.Marland Kitchen History of DVT  P:  Trending cell counts and INR daily Heparin drip per pharmacy protocol  Transfusing for Hgb <7.0 or active bleeding  INFECTIOUS A:   Sepsis Klebsiella pneumonia Ischemic right foot/cellulitis  P:   Continuing ciprofloxacin day #8/10 total Plan to repeat culture for any fever Continuing empiric vancomycin for cellulitis  ENDOCRINE A:   Hypothyroidism  P:   Synthroid via tube daily  NEUROLOGIC A:   Sedation on ventilator Pain control postoperatively  P:   RASS goal: 0 to -1  Weaning Fentanyl drip & IV bolus as needed for pain Versed IV when necessary sedation Continuing to hold fentanyl drip  Prophylaxis:    Heparin drip per pharmacy consult. Protonix IV daily. Diet:  NPO. Restarting trickle tube feedings with instructions not to advance. Code Status:  Full Code per previous physician discussions. Disposition:  Remains critically ill.  Family Update: No family at bedside 8/16.   Nickolas Madrid, NP 03/19/2017  9:27 AM Pager: [phone] or [phone]

## 2017-03-19 NOTE — Progress Notes (Signed)
ANTICOAGULATION CONSULT NOTE - Follow Up Consult  Pharmacy Consult for Heparin Indication: Afib  No Known Allergies  Patient Measurements:  Heparin Dosing Weight: 86.7kg  Vital Signs: Temp: 98.7 F (37.1 C) (08/16 1953) Temp Source: Oral (08/16 1953) BP: 85/57 (08/16 1800) Pulse Rate: 109 (08/16 1944)  Labs:  Recent Labs  03/17/17 0528  03/18/17 0508  03/18/17 1919 03/19/17 0422 03/19/17 1600 03/19/17 1859  HGB 10.8*  --  10.8*  --   --  10.8*  --   --   HCT 33.9*  --  34.0*  --   --  35.9*  --   --   PLT 593*  --  380  --   --  387  --   --   LABPROT 41.8*  --  44.9*  --   --  17.5*  --   --   INR 3.98  --  4.65*  --   --  1.42  --   --   HEPARINUNFRC  --   --   --   --   --   --  <0.10*  --   CREATININE  --   < >  --   < > 1.89* 1.79*  --  1.73*  < > = values in this interval not displayed.  Estimated Creatinine Clearance: 32.9 mL/min (A) (by C-G formula based on SCr of 1.73 mg/dL (H)).   Assessment: 55 yoF presents with leg pain and known chronic occlusion. Pt is on warfarin PTA for AFib that was bridged with IV heparin pending procedures. INR then trended up despite holding warfarin throughout admit and giving vitamin K 8/2, so heparin infusion was turned off.   INR now subtherapeutic at 1.42 today s/p IV vitamin K yesterday. Will initiate heparin drip with no bolus. Earlier in admit heparin was therapeutic at 1700 units/hr but will start slightly lower given critical illness. CBC stable, no S/Sx bleeding per RN.  Heparin drip 1400 uts/hr HL < 0.1 less than goal - IV line ok seems to be infusing properly  Goal of Therapy:  Heparin level 0.3-0.7 units/ml Monitor platelets by anticoagulation protocol: Yes   Plan:  Increase  heparin 1600 units/hr -Monitor heparin level, CBC, S/Sx bleeding daily  Bonnita Nasuti Pharm.D. CPP, BCPS Clinical Pharmacist [phone] 03/19/2017 8:21 PM

## 2017-03-19 NOTE — Progress Notes (Signed)
Pharmacy Antibiotic Note  Charlotte [name] is a 76 y.o. female admitted on 03/04/2017 with pneumonia and cellulitis.  Pharmacy has been consulted for ciprofloxacin and vancomycin dosing. Vancomycin trough today is elevated at 31 mcg/ml despite SCr improving and patient having great UOP.  Plan: -Hold vancomycin -Obtain vancomycin random for further dosing -Continue ciprofloxacin at 400mg  IV q24h -Monitor renal function, cultures, LOT   Height: 6' (182.9 cm) Weight: 191 lb 2.2 oz (86.7 kg) IBW/kg (Calculated) : 73.1  Temp (24hrs), Avg:98.1 F (36.7 C), Min:97.5 F (36.4 C), Max:98.5 F (36.9 C)   Recent Labs Lab 03/12/17 1553  03/15/17 0410 03/16/17 0356 03/17/17 0528 03/17/17 1230 03/18/17 0508 03/18/17 0752 03/18/17 1919 03/19/17 0422 03/19/17 1050  WBC  --   < > 27.7* 27.1* 23.9*  --  19.8*  --   --  26.4*  --   CREATININE  --   < > 2.59* 2.32*  --  2.22*  --  2.05* 1.89* 1.79*  --   LATICACIDVEN 1.5  --   --   --   --   --   --   --   --   --   --   VANCOTROUGH  --   --   --   --   --   --   --   --   --   --  31*  < > = values in this interval not displayed.  Estimated Creatinine Clearance: 31.8 mL/min (A) (by C-G formula based on SCr of 1.79 mg/dL (H)).    No Known Allergies  Antimicrobials this admission: Vancomycin 8/9 >> 8/11; 8/13>> Cefepime 8/9 >> 8/11 Ceftriaxone 8/11 >>8/13 Ciprofloxacin 8/13>>  Dose adjustments this admission: 8/16 VT = 31 - hold vancomycin  Microbiology results: 8/9 Trach asp >> rare yeast, mod Kleb  8/8 Trach asp >> rare GPC, rare Kleb 8/3 MRSA PCR: neg  Thank you for allowing pharmacy to be a part of this patient's care.  Arrie Senate, PharmD PGY-2 Cardiology Pharmacy Resident Pager: [phone] 03/19/2017

## 2017-03-19 NOTE — Progress Notes (Signed)
DAILY PROGRESS NOTE   Patient Name: Charlotte [name] Date of Encounter: 03/19/2017  Hospital Problem List   Principal Problem:   Lower extremity arterial insufficiency, severe, right ([place]) Active Problems:   Polycythemia vera ([place])   Chronic atrial fibrillation ([place])   HTN (hypertension)   Hypothyroidism   Peripheral vascular disease (HCC)   Thrombocytosis (HCC)   Hyperlipidemia   Acute renal failure (ARF) ([place])   Pressure injury of skin    Chief Complaint   "indicates trouble breathing with hand gestures"  Subjective   Remains intubated - diuresing well overnight. MAP in 90's, remains on levophed. This can likely be weaned down - hopefully to off. On amiodarone per tube.  Objective   Vitals:   03/19/17 0800 03/19/17 0900 03/19/17 0903 03/19/17 0904  BP: 111/68 124/84    Pulse: (!) 117 (!) 118 (!) 129   Resp: 15 (!) 25 19   Temp:    98.1 F (36.7 C)  TempSrc:    Oral  SpO2: 97% 96% 97%   Weight:      Height:        Intake/Output Summary (Last 24 hours) at 03/19/17 0943 Last data filed at 03/19/17 9794  Gross per 24 hour  Intake           469.52 ml  Output             5700 ml  Net         -5230.48 ml   Filed Weights   03/17/17 0456 03/18/17 0443 03/19/17 0500  Weight: 206 lb 5.6 oz (93.6 kg) 203 lb 4.2 oz (92.2 kg) 191 lb 2.2 oz (86.7 kg)    Physical Exam   General appearance: alert, no distress and intubated Neck: no carotid bruit, no JVD and thyroid not enlarged, symmetric, no tenderness/mass/nodules Lungs: diminished breath sounds bilaterally Heart: regular rate and rhythm Abdomen: soft, non-tender; bowel sounds normal; no masses,  no organomegaly Extremities: venous stasis dermatitis noted and decreased pulses and discoloration of the RLE Pulses: faint pulses Skin: LE venous stasis changes Neurologic: Mental status: Alert, follows commands Psych: Cannot asses  Inpatient Medications    Scheduled Meds: . amiodarone  200 mg Per Tube Daily    . aspirin  81 mg Per Tube Daily  . chlorhexidine gluconate (MEDLINE KIT)  15 mL Mouth Rinse BID  . Chlorhexidine Gluconate Cloth  6 each Topical Daily  . docusate  100 mg Per Tube BID  . feeding supplement (PRO-STAT SUGAR FREE 64)  30 mL Per Tube TID  . fentaNYL (SUBLIMAZE) injection  50 mcg Intravenous Once  . gabapentin  100 mg Per Tube Q8H  . hydroxyurea  1,000 mg Per Tube Daily  . insulin aspart  0-15 Units Subcutaneous Q4H  . levothyroxine  75 mcg Per NG tube QAC breakfast  . mouth rinse  15 mL Mouth Rinse 10 times per day  . pantoprazole (PROTONIX) IV  40 mg Intravenous Q24H  . polyethylene glycol  17 g Oral Daily  . sennosides  5 mL Per Tube BID  . sodium chloride flush  10-40 mL Intracatheter Q12H    Continuous Infusions: . ciprofloxacin Stopped (03/18/17 1316)  . dexmedetomidine (PRECEDEX) IV infusion Stopped (03/16/17 1300)  . feeding supplement (VITAL AF 1.2 CAL) 1,000 mL (03/19/17 0600)  . fentaNYL infusion INTRAVENOUS Stopped (03/18/17 0800)  . furosemide Stopped (03/19/17 0350)  . heparin 1,400 Units/hr (03/19/17 0904)  . norepinephrine (LEVOPHED) Adult infusion 8 mcg/min (03/19/17 0904)  .  vancomycin Stopped (03/18/17 1439)    PRN Meds: alum & mag hydroxide-simeth, fentaNYL, fentaNYL (SUBLIMAZE) injection, hydrALAZINE, labetalol, metoprolol tartrate, midazolam, ondansetron **OR** ondansetron (ZOFRAN) IV, sodium chloride flush   Labs   Results for orders placed or performed during the hospital encounter of 03/04/17 (from the past 48 hour(s))  Glucose, capillary     Status: None   Collection Time: 03/17/17 11:13 AM  Result Value Ref Range   Glucose-Capillary 99 65 - 99 mg/dL   Comment 1 Capillary Specimen    Comment 2 Notify RN   Comprehensive metabolic panel     Status: Abnormal   Collection Time: 03/17/17 12:30 PM  Result Value Ref Range   Sodium 137 135 - 145 mmol/L   Potassium 3.0 (L) 3.5 - 5.1 mmol/L   Chloride 108 101 - 111 mmol/L   CO2 19 (L) 22 -  32 mmol/L   Glucose, Bld 123 (H) 65 - 99 mg/dL   BUN 77 (H) 6 - 20 mg/dL   Creatinine, Ser 2.22 (H) 0.44 - 1.00 mg/dL   Calcium 7.9 (L) 8.9 - 10.3 mg/dL   Total Protein 4.5 (L) 6.5 - 8.1 g/dL   Albumin 1.8 (L) 3.5 - 5.0 g/dL   AST 103 (H) 15 - 41 U/L   ALT 244 (H) 14 - 54 U/L   Alkaline Phosphatase 68 38 - 126 U/L   Total Bilirubin 1.3 (H) 0.3 - 1.2 mg/dL   GFR calc non Af Amer 21 (L) >60 mL/min   GFR calc Af Amer 24 (L) >60 mL/min    Comment: (NOTE) The eGFR has been calculated using the CKD EPI equation. This calculation has not been validated in all clinical situations. eGFR's persistently <60 mL/min signify possible Chronic Kidney Disease.    Anion gap 10 5 - 15  Phosphorus     Status: None   Collection Time: 03/17/17 12:30 PM  Result Value Ref Range   Phosphorus 4.4 2.5 - 4.6 mg/dL  Magnesium     Status: None   Collection Time: 03/17/17 12:30 PM  Result Value Ref Range   Magnesium 1.8 1.7 - 2.4 mg/dL  Glucose, capillary     Status: Abnormal   Collection Time: 03/17/17  4:17 PM  Result Value Ref Range   Glucose-Capillary 104 (H) 65 - 99 mg/dL   Comment 1 Capillary Specimen    Comment 2 Notify RN   Glucose, capillary     Status: None   Collection Time: 03/17/17  7:36 PM  Result Value Ref Range   Glucose-Capillary 99 65 - 99 mg/dL   Comment 1 Capillary Specimen    Comment 2 Notify RN    Comment 3 Document in Chart   Glucose, capillary     Status: Abnormal   Collection Time: 03/17/17 11:30 PM  Result Value Ref Range   Glucose-Capillary 102 (H) 65 - 99 mg/dL   Comment 1 Capillary Specimen    Comment 2 Notify RN    Comment 3 Document in Chart   Glucose, capillary     Status: Abnormal   Collection Time: 03/18/17  3:38 AM  Result Value Ref Range   Glucose-Capillary 113 (H) 65 - 99 mg/dL   Comment 1 Capillary Specimen    Comment 2 Notify RN    Comment 3 Document in Chart   Protime-INR     Status: Abnormal   Collection Time: 03/18/17  5:08 AM  Result Value Ref  Range   Prothrombin Time 44.9 (H) 11.4 - 15.2 seconds  INR 4.65 (HH)     Comment: REPEATED TO VERIFY CRITICAL RESULT CALLED TO, READ BACK BY AND VERIFIED WITH: B. TREVENIS,RN [phone] BY Rhea Bleacher   CBC with Differential/Platelet     Status: Abnormal   Collection Time: 03/18/17  5:08 AM  Result Value Ref Range   WBC 19.8 (H) 4.0 - 10.5 K/uL   RBC 4.63 3.87 - 5.11 MIL/uL   Hemoglobin 10.8 (L) 12.0 - 15.0 g/dL   HCT 34.0 (L) 36.0 - 46.0 %   MCV 73.4 (L) 78.0 - 100.0 fL   MCH 23.3 (L) 26.0 - 34.0 pg   MCHC 31.8 30.0 - 36.0 g/dL   RDW 26.1 (H) 11.5 - 15.5 %   Platelets 380 150 - 400 K/uL   Neutrophils Relative % 84 %   Lymphocytes Relative 4 %   Monocytes Relative 10 %   Eosinophils Relative 2 %   Basophils Relative 0 %   Neutro Abs 16.6 (H) 1.7 - 7.7 K/uL   Lymphs Abs 0.8 0.7 - 4.0 K/uL   Monocytes Absolute 2.0 (H) 0.1 - 1.0 K/uL   Eosinophils Absolute 0.4 0.0 - 0.7 K/uL   Basophils Absolute 0.0 0.0 - 0.1 K/uL   RBC Morphology POLYCHROMASIA PRESENT     Comment: RARE NRBCs BASOPHILIC STIPPLING TARGET CELLS    WBC Morphology TOXIC GRANULATION     Comment: DOHLE BODIES  Glucose, capillary     Status: Abnormal   Collection Time: 03/18/17  7:23 AM  Result Value Ref Range   Glucose-Capillary 106 (H) 65 - 99 mg/dL   Comment 1 Capillary Specimen    Comment 2 Notify RN   Comprehensive metabolic panel     Status: Abnormal   Collection Time: 03/18/17  7:52 AM  Result Value Ref Range   Sodium 138 135 - 145 mmol/L   Potassium 2.8 (L) 3.5 - 5.1 mmol/L   Chloride 107 101 - 111 mmol/L   CO2 19 (L) 22 - 32 mmol/L   Glucose, Bld 108 (H) 65 - 99 mg/dL   BUN 75 (H) 6 - 20 mg/dL   Creatinine, Ser 2.05 (H) 0.44 - 1.00 mg/dL   Calcium 7.9 (L) 8.9 - 10.3 mg/dL   Total Protein 4.4 (L) 6.5 - 8.1 g/dL   Albumin 1.9 (L) 3.5 - 5.0 g/dL   AST 47 (H) 15 - 41 U/L   ALT 180 (H) 14 - 54 U/L   Alkaline Phosphatase 57 38 - 126 U/L   Total Bilirubin 1.2 0.3 - 1.2 mg/dL   GFR calc non Af  Amer 23 (L) >60 mL/min   GFR calc Af Amer 26 (L) >60 mL/min    Comment: (NOTE) The eGFR has been calculated using the CKD EPI equation. This calculation has not been validated in all clinical situations. eGFR's persistently <60 mL/min signify possible Chronic Kidney Disease.    Anion gap 12 5 - 15  Magnesium     Status: Abnormal   Collection Time: 03/18/17  7:52 AM  Result Value Ref Range   Magnesium 1.6 (L) 1.7 - 2.4 mg/dL  Phosphorus     Status: None   Collection Time: 03/18/17  7:52 AM  Result Value Ref Range   Phosphorus 4.2 2.5 - 4.6 mg/dL  Glucose, capillary     Status: Abnormal   Collection Time: 03/18/17 12:06 PM  Result Value Ref Range   Glucose-Capillary 118 (H) 65 - 99 mg/dL   Comment 1 Capillary Specimen    Comment 2 Notify  RN   Glucose, capillary     Status: Abnormal   Collection Time: 03/18/17  3:51 PM  Result Value Ref Range   Glucose-Capillary 125 (H) 65 - 99 mg/dL   Comment 1 Capillary Specimen   Basic metabolic panel     Status: Abnormal   Collection Time: 03/18/17  7:19 PM  Result Value Ref Range   Sodium 138 135 - 145 mmol/L   Potassium 3.3 (L) 3.5 - 5.1 mmol/L   Chloride 108 101 - 111 mmol/L   CO2 21 (L) 22 - 32 mmol/L   Glucose, Bld 119 (H) 65 - 99 mg/dL   BUN 76 (H) 6 - 20 mg/dL   Creatinine, Ser 1.89 (H) 0.44 - 1.00 mg/dL   Calcium 8.0 (L) 8.9 - 10.3 mg/dL   GFR calc non Af Amer 25 (L) >60 mL/min   GFR calc Af Amer 29 (L) >60 mL/min    Comment: (NOTE) The eGFR has been calculated using the CKD EPI equation. This calculation has not been validated in all clinical situations. eGFR's persistently <60 mL/min signify possible Chronic Kidney Disease.    Anion gap 9 5 - 15  Magnesium     Status: None   Collection Time: 03/18/17  7:19 PM  Result Value Ref Range   Magnesium 2.1 1.7 - 2.4 mg/dL  Glucose, capillary     Status: Abnormal   Collection Time: 03/18/17  7:34 PM  Result Value Ref Range   Glucose-Capillary 116 (H) 65 - 99 mg/dL   Comment 1  Capillary Specimen    Comment 2 Notify RN    Comment 3 Document in Chart   Glucose, capillary     Status: Abnormal   Collection Time: 03/18/17 11:47 PM  Result Value Ref Range   Glucose-Capillary 127 (H) 65 - 99 mg/dL   Comment 1 Capillary Specimen    Comment 2 Notify RN    Comment 3 Document in Chart   Glucose, capillary     Status: Abnormal   Collection Time: 03/19/17  3:42 AM  Result Value Ref Range   Glucose-Capillary 120 (H) 65 - 99 mg/dL   Comment 1 Capillary Specimen    Comment 2 Notify RN    Comment 3 Document in Chart   Protime-INR     Status: Abnormal   Collection Time: 03/19/17  4:22 AM  Result Value Ref Range   Prothrombin Time 17.5 (H) 11.4 - 15.2 seconds   INR 1.42   Comprehensive metabolic panel     Status: Abnormal   Collection Time: 03/19/17  4:22 AM  Result Value Ref Range   Sodium 139 135 - 145 mmol/L   Potassium 5.1 3.5 - 5.1 mmol/L    Comment: DELTA CHECK NOTED SPECIMEN HEMOLYZED. HEMOLYSIS MAY AFFECT INTEGRITY OF RESULTS.    Chloride 108 101 - 111 mmol/L   CO2 19 (L) 22 - 32 mmol/L   Glucose, Bld 103 (H) 65 - 99 mg/dL   BUN 79 (H) 6 - 20 mg/dL   Creatinine, Ser 1.79 (H) 0.44 - 1.00 mg/dL   Calcium 7.9 (L) 8.9 - 10.3 mg/dL   Total Protein 5.0 (L) 6.5 - 8.1 g/dL   Albumin 2.1 (L) 3.5 - 5.0 g/dL   AST 50 (H) 15 - 41 U/L   ALT 134 (H) 14 - 54 U/L   Alkaline Phosphatase 67 38 - 126 U/L   Total Bilirubin 1.6 (H) 0.3 - 1.2 mg/dL   GFR calc non Af Amer 27 (L) >60 mL/min  GFR calc Af Amer 31 (L) >60 mL/min    Comment: (NOTE) The eGFR has been calculated using the CKD EPI equation. This calculation has not been validated in all clinical situations. eGFR's persistently <60 mL/min signify possible Chronic Kidney Disease.    Anion gap 12 5 - 15  CBC with Differential/Platelet     Status: Abnormal   Collection Time: 03/19/17  4:22 AM  Result Value Ref Range   WBC 26.4 (H) 4.0 - 10.5 K/uL   RBC 4.72 3.87 - 5.11 MIL/uL   Hemoglobin 10.8 (L) 12.0 - 15.0  g/dL   HCT 35.9 (L) 36.0 - 46.0 %   MCV 76.1 (L) 78.0 - 100.0 fL   MCH 22.9 (L) 26.0 - 34.0 pg   MCHC 30.1 30.0 - 36.0 g/dL   RDW 27.4 (H) 11.5 - 15.5 %   Platelets 387 150 - 400 K/uL    Comment: REPEATED TO VERIFY PLATELET COUNT CONFIRMED BY SMEAR    Neutrophils Relative % 85 %   Lymphocytes Relative 4 %   Monocytes Relative 9 %   Eosinophils Relative 2 %   Basophils Relative 0 %   Neutro Abs 22.4 (H) 1.7 - 7.7 K/uL   Lymphs Abs 1.1 0.7 - 4.0 K/uL   Monocytes Absolute 2.4 (H) 0.1 - 1.0 K/uL   Eosinophils Absolute 0.5 0.0 - 0.7 K/uL   Basophils Absolute 0.0 0.0 - 0.1 K/uL   RBC Morphology POLYCHROMASIA PRESENT     Comment: RARE NRBCs   WBC Morphology TOXIC GRANULATION     Comment: VACUOLATED NEUTROPHILS  Magnesium     Status: None   Collection Time: 03/19/17  4:22 AM  Result Value Ref Range   Magnesium 2.1 1.7 - 2.4 mg/dL  Phosphorus     Status: None   Collection Time: 03/19/17  4:22 AM  Result Value Ref Range   Phosphorus 4.2 2.5 - 4.6 mg/dL    ECG   N/A - Personally Reviewed  Telemetry   A-fib with RVR - Personally Reviewed  Radiology    Ct Abdomen Pelvis Wo Contrast  Result Date: 03/17/2017 CLINICAL DATA:  Nausea.  Bilious vomiting. EXAM: CT ABDOMEN AND PELVIS WITHOUT CONTRAST TECHNIQUE: Multidetector CT imaging of the abdomen and pelvis was performed following the standard protocol without IV contrast. COMPARISON:  Right upper quadrant ultrasound yesterday. Remote abdominal CT 07/18/2009 FINDINGS: Lower chest: Bilateral pleural effusions, moderate to large in degree, partially included. There is adjacent compressive atelectasis in the lower lobes. Cardiomegaly with right greater than left heart dilatation. Hepatobiliary: No focal hepatic lesion allowing for lack contrast and arms down positioning. Layering high-density material gallbladder consistent with sludge and stones is seen on recent ultrasound. No biliary dilatation. Pancreas: No ductal dilatation. No gross  peripancreatic inflammation. Spleen: Subcapsular splenic calcifications likely sequela of infarcts seen on prior CT. Spleen is upper normal in size spanning 12.7 cm cranial caudal. Adrenals/Urinary Tract: No adrenal nodules, adrenal glands are partially obscured. There is increased density of the bilateral renal cortices on this noncontrast exam suggesting cortical nephrocalcinosis. No hydronephrosis. No definite urinary tract calculi. Urinary bladder is decompressed by Foley catheter. Stomach/Bowel: Enteric tube in place, tip in the stomach. Stomach is physiologically distended. No evidence of bowel or duodenal obstruction. Enteric contrast throughout nondilated small bowel. Appendix tentatively identified and normal. Majority of the colon is nondistended, scattered air-fluid levels. No definite colonic wall thickening. A rectal tube is in place. Vascular/Lymphatic: Advanced aortic and branch atherosclerosis. Limited assessment for adenopathy given lack  of contrast. Reproductive: Uterus is not seen, atrophic versus surgically absent. No gross adnexal mass. Other: Small volume abdominopelvic ascites. Diffuse edema of the intra-abdominal fat. Marked subcutaneous edema, confluent in the flanks. No free intra-abdominal air. No evidence of intra-abdominal abscess. Musculoskeletal: Right hip pinning. Bones are under mineralized. There are no acute or suspicious osseous abnormalities. IMPRESSION: 1. No explanation for bilious vomiting.  No bowel obstruction. 2. Findings consistent with fluid overload with body wall edema, third-spacing, small amount of abdominopelvic ascites and moderate to large bilateral pleural effusions. 3. Cortical nephrocalcinosis. 4. Gallstones/sludge. 5. Advanced aortic atherosclerosis. Electronically Signed   By: Jeb Levering M.D.   On: 03/17/2017 22:35    Cardiac Studies   N/A  Assessment   1. Principal Problem: 2.   Lower extremity arterial insufficiency, severe, right  ([place]) 3. Active Problems: 4.   Polycythemia vera ([place]) 5.   Chronic atrial fibrillation (HCC) 6.   HTN (hypertension) 7.   Hypothyroidism 8.   Peripheral vascular disease ([place]) 9.   Thrombocytosis ([place]) 10.   Hyperlipidemia 11.   Acute renal failure (ARF) ([place]) 12.   Pressure injury of skin 13.   Plan   1. Continue aggressive diuresis. Creatinine is improving. Wean levophed to off if possible. Continue po amiodarone - suspect a-fib with RVR worsened by SBT.   Time Spent Directly with Patient:  I have spent a total of 15 minutes with the patient reviewing hospital notes, telemetry, EKGs, labs and examining the patient as well as establishing an assessment and plan that was discussed personally with the patient. > 50% of time was spent in direct patient care.  Length of Stay:  LOS: 15 days   Pixie Casino, MD, [place]  Attending Cardiologist  Direct Dial: [phone]  Fax: [phone]  Website:  [email or web address] 03/19/2017, 9:43 AM

## 2017-03-19 NOTE — Progress Notes (Signed)
  Progress Note    03/19/2017 7:45 AM 8 Days Post-Op   No new issues  Vitals:   03/19/17 0645 03/19/17 0735  BP:  127/79  Pulse: (!) 121 (!) 130  Resp: 18 (!) 23  Temp:    SpO2: 94% 98%    Physical Exam: More alert this a.m. Abdomen is soft Incisions are cdi Right foot has dusky 4th/5th toes with blistering on lower leg Right foot cap refill is brisk, strong left pt signal  CBC    Component Value Date/Time   WBC 26.4 (H) 03/19/2017 0422   RBC 4.72 03/19/2017 0422   HGB 10.8 (L) 03/19/2017 0422   HCT 35.9 (L) 03/19/2017 0422   PLT 387 03/19/2017 0422   MCV 76.1 (L) 03/19/2017 0422   MCH 22.9 (L) 03/19/2017 0422   MCHC 30.1 03/19/2017 0422   RDW 27.4 (H) 03/19/2017 0422   LYMPHSABS 1.1 03/19/2017 0422   MONOABS 2.4 (H) 03/19/2017 0422   EOSABS 0.5 03/19/2017 0422   BASOSABS 0.0 03/19/2017 0422    BMET    Component Value Date/Time   NA 139 03/19/2017 0422   K 5.1 03/19/2017 0422   CL 108 03/19/2017 0422   CO2 19 (L) 03/19/2017 0422   GLUCOSE 103 (H) 03/19/2017 0422   BUN 79 (H) 03/19/2017 0422   CREATININE 1.79 (H) 03/19/2017 0422   CREATININE 1.21 (H) 08/14/2016 0733   CALCIUM 7.9 (L) 03/19/2017 0422   GFRNONAA 27 (L) 03/19/2017 0422   GFRAA 31 (L) 03/19/2017 0422    INR    Component Value Date/Time   INR 1.42 03/19/2017 0422     Intake/Output Summary (Last 24 hours) at 03/19/17 0745 Last data filed at 03/19/17 0600  Gross per 24 hour  Intake           492.05 ml  Output             5000 ml  Net         -4507.95 ml     Assessment:  76 y.o. female is s/p right femoral to tibial artery bypass Plan: Vent management per pccm Will allow right foot to demarcate and get better sensorimotor exam when extubated Continue aspirin consider heparin drip now that inr normalized   Charlotte [name] C. [name], MD [facility] Office: [phone] Pager: [phone]  03/19/2017 7:45 AM

## 2017-03-19 NOTE — Care Management Note (Signed)
Case Management Note [name] RN, BSN Unit 4E-Case Manager [phone]  Patient Details  Name: Charlotte [name] MRN: [id number] Date of Birth: 1941/01/16  Subjective/Objective:     Pt admitted with LE arterial insufficiency- plan for OR on Thur 8/9 for  Fem-tibioperoneal bypass for attempted limb salvage- remains on IV heparin              Action/Plan: PTA pt lived at home with spouse- will need PT/OT evals when able to participate to assist in d/c planning- pt may be eligible for Home First program with [facility]- CM will continue to follow post op for d/c needs and plan.   Expected Discharge Date:                 Expected Discharge Plan:  [place]  In-House Referral:  Clinical Social Work  Discharge planning Services  CM Consult  Post Acute Care Choice:    Choice offered to:     DME Arranged:    DME Agency:     HH Arranged:    [place] Agency:     Status of Service:  In process, will continue to follow  If discussed at Long Length of Stay Meetings, dates discussed:  8/7  Discharge Disposition:   Additional Comments: 03/19/2017  Discussed in LOS 8/16 - pt remains appropriate for continued stay.  Pt remains on ventilator and not following commands  03/18/17 Informed by [facility] that pt was referred to them by Vascular Surgery for  [facility], PT and OT if needed.    Discussed in LOS 8/14 - pt remains appropriate for continued stay.  LTACH referral given during LOS - however attending has deemed LTACH referral at this time to be inappropriate; pt remains on ETT vent and not stable to transfer. [name], RN 03/19/2017, 1:31 PM

## 2017-03-19 NOTE — Progress Notes (Signed)
235ml of Fentanyl wasted down sink from IV bag- Witnessed by Lavella Lemons, RN.

## 2017-03-19 NOTE — Progress Notes (Signed)
eLink Physician-Brief Progress Note Patient Name: Charlotte [name] DOB: 10/01/1940 MRN: [id number]   Date of Service  03/19/2017  HPI/Events of Note  K 2.7  eICU Interventions  Repleted     Intervention Category Intermediate Interventions: Electrolyte abnormality - evaluation and management  [name] 03/19/2017, 8:20 PM

## 2017-03-20 LAB — MAGNESIUM: MAGNESIUM: 1.9 mg/dL (ref 1.7–2.4)

## 2017-03-20 LAB — CBC
HEMATOCRIT: 33 % — AB (ref 36.0–46.0)
HEMOGLOBIN: 10.1 g/dL — AB (ref 12.0–15.0)
MCH: 22.9 pg — AB (ref 26.0–34.0)
MCHC: 30.6 g/dL (ref 30.0–36.0)
MCV: 74.7 fL — AB (ref 78.0–100.0)
Platelets: 194 10*3/uL (ref 150–400)
RBC: 4.42 MIL/uL (ref 3.87–5.11)
RDW: 25.4 % — ABNORMAL HIGH (ref 11.5–15.5)
WBC: 22.5 10*3/uL — ABNORMAL HIGH (ref 4.0–10.5)

## 2017-03-20 LAB — GLUCOSE, CAPILLARY
GLUCOSE-CAPILLARY: 102 mg/dL — AB (ref 65–99)
GLUCOSE-CAPILLARY: 78 mg/dL (ref 65–99)
GLUCOSE-CAPILLARY: 77 mg/dL (ref 65–99)
GLUCOSE-CAPILLARY: 63 mg/dL — AB (ref 65–99)
GLUCOSE-CAPILLARY: 74 mg/dL (ref 65–99)
GLUCOSE-CAPILLARY: 83 mg/dL (ref 65–99)
Glucose-Capillary: 77 mg/dL (ref 65–99)

## 2017-03-20 LAB — HEPARIN LEVEL (UNFRACTIONATED)
HEPARIN UNFRACTIONATED: 0.23 [IU]/mL — AB (ref 0.30–0.70)
HEPARIN UNFRACTIONATED: 0.49 [IU]/mL (ref 0.30–0.70)

## 2017-03-20 LAB — COMPREHENSIVE METABOLIC PANEL
ALBUMIN: 1.9 g/dL — AB (ref 3.5–5.0)
ALT: 85 U/L — ABNORMAL HIGH (ref 14–54)
AST: 74 U/L — ABNORMAL HIGH (ref 15–41)
Alkaline Phosphatase: 73 U/L (ref 38–126)
Anion gap: 11 (ref 5–15)
BUN: 75 mg/dL — AB (ref 6–20)
CHLORIDE: 108 mmol/L (ref 101–111)
CO2: 22 mmol/L (ref 22–32)
Calcium: 8 mg/dL — ABNORMAL LOW (ref 8.9–10.3)
Creatinine, Ser: 1.57 mg/dL — ABNORMAL HIGH (ref 0.44–1.00)
GFR calc Af Amer: 36 mL/min — ABNORMAL LOW (ref >60)
GFR calc non Af Amer: 31 mL/min — ABNORMAL LOW (ref >60)
GLUCOSE: 91 mg/dL (ref 65–99)
POTASSIUM: 5.2 mmol/L — AB (ref 3.5–5.1)
SODIUM: 141 mmol/L (ref 135–145)
Total Bilirubin: 1.9 mg/dL — ABNORMAL HIGH (ref 0.3–1.2)
Total Protein: 4.4 g/dL — ABNORMAL LOW (ref 6.5–8.1)

## 2017-03-20 LAB — PROTIME-INR
INR: 1.64
Prothrombin Time: 19.7 seconds — ABNORMAL HIGH (ref 11.4–15.2)

## 2017-03-20 LAB — PHOSPHORUS: Phosphorus: 4 mg/dL (ref 2.5–4.6)

## 2017-03-20 MED ORDER — PANTOPRAZOLE SODIUM 40 MG IV SOLR
40.0000 mg | INTRAVENOUS | Status: DC
  Administered 2017-03-21 – 2017-03-23 (×3): 40 mg via INTRAVENOUS
  Filled 2017-03-20 (×3): qty 40

## 2017-03-20 MED ORDER — ASPIRIN 81 MG PO CHEW
81.0000 mg | CHEWABLE_TABLET | Freq: Every day | ORAL | Status: DC
  Administered 2017-03-21 – 2017-03-29 (×9): 81 mg via ORAL
  Filled 2017-03-20 (×9): qty 1

## 2017-03-20 MED ORDER — GABAPENTIN 100 MG PO CAPS: 100.0000 mg | ORAL_CAPSULE | Freq: Three times a day (TID) | ORAL | Status: DC

## 2017-03-20 MED ORDER — DEXTROSE 50 % IV SOLN
INTRAVENOUS | Status: AC
  Filled 2017-03-20: qty 50

## 2017-03-20 MED ORDER — CHLORHEXIDINE GLUCONATE 0.12 % MT SOLN
15.0000 mL | Freq: Two times a day (BID) | OROMUCOSAL | Status: DC
  Administered 2017-03-20: 15 mL via OROMUCOSAL

## 2017-03-20 MED ORDER — DOCUSATE SODIUM 50 MG/5ML PO LIQD: 100.0000 mg | Freq: Two times a day (BID) | ORAL | Status: DC

## 2017-03-20 MED ORDER — GABAPENTIN 250 MG/5ML PO SOLN
100.0000 mg | Freq: Three times a day (TID) | ORAL | Status: DC
  Filled 2017-03-20: qty 2

## 2017-03-20 MED ORDER — FENTANYL CITRATE (PF) 100 MCG/2ML IJ SOLN: 12.5000 ug | INTRAMUSCULAR | Status: DC | PRN

## 2017-03-20 MED ORDER — HYDROXYUREA 500 MG PO CAPS
1000.0000 mg | ORAL_CAPSULE | Freq: Every day | ORAL | Status: DC
  Administered 2017-03-23 – 2017-03-29 (×7): 1000 mg via ORAL
  Filled 2017-03-20 (×11): qty 2

## 2017-03-20 MED ORDER — ORAL CARE MOUTH RINSE
15.0000 mL | Freq: Two times a day (BID) | OROMUCOSAL | Status: DC
  Administered 2017-03-20: 15 mL via OROMUCOSAL

## 2017-03-20 MED ORDER — DEXTROSE 50 % IV SOLN
25.0000 g | Freq: Once | INTRAVENOUS | Status: AC
  Administered 2017-03-20: 25 g via INTRAVENOUS

## 2017-03-20 MED ORDER — HYDROXYUREA 100 MG/ML ORAL SUSPENSION: 1000.0000 mg | Freq: Every day | ORAL | Status: DC

## 2017-03-20 MED ORDER — WHITE PETROLATUM GEL
Status: AC
  Administered 2017-03-20: 19:00:00
  Filled 2017-03-20: qty 1

## 2017-03-20 MED ORDER — LEVOTHYROXINE SODIUM 75 MCG PO TABS
75.0000 ug | ORAL_TABLET | Freq: Every day | ORAL | Status: DC
  Administered 2017-03-22 – 2017-03-26 (×5): 75 ug via ORAL
  Filled 2017-03-20 (×5): qty 1

## 2017-03-20 MED ORDER — AMIODARONE HCL 200 MG PO TABS
200.0000 mg | ORAL_TABLET | Freq: Every day | ORAL | Status: DC
  Administered 2017-03-21 – 2017-03-22 (×2): 200 mg via ORAL
  Filled 2017-03-20 (×2): qty 1

## 2017-03-20 MED ORDER — DEXTROSE 10 % IV SOLN
INTRAVENOUS | Status: DC
  Administered 2017-03-20 – 2017-03-23 (×2): 1000 mL via INTRAVENOUS
  Administered 2017-03-26 – 2017-03-28 (×2): via INTRAVENOUS

## 2017-03-20 MED ORDER — SENNOSIDES-DOCUSATE SODIUM 8.6-50 MG PO TABS: 1.0000 | ORAL_TABLET | Freq: Every day | ORAL | Status: DC

## 2017-03-20 NOTE — Progress Notes (Signed)
Orthopedic Tech Progress Note Patient Details:  Charlotte [name] [birth year] [id number]  Patient ID: [name], female   DOB: 03-08-1941, 76 y.o.   MRN: [id number]   [place] Pink 03/20/2017, 1:24 PMCalled [employer] for foot and ankle orthosis.

## 2017-03-20 NOTE — Progress Notes (Signed)
ANTICOAGULATION CONSULT NOTE - Follow Up Consult  Pharmacy Consult for Heparin  Indication: atrial fibrillation  No Known Allergies  Patient Measurements: Height: 6' (182.9 cm) Weight: 189 lb 9.5 oz (86 kg) IBW/kg (Calculated) : 73.1  Vital Signs: Temp: 97 F (36.1 C) (08/17 1534) Temp Source: Axillary (08/17 1534) BP: 110/73 (08/17 1600) Pulse Rate: 102 (08/17 1534)  Labs:  Recent Labs  03/18/17 0508  03/19/17 0422 03/19/17 1600 03/19/17 1859 03/20/17 0432 03/20/17 0610 03/20/17 1524  HGB 10.8*  --  10.8*  --   --  10.1*  --   --   HCT 34.0*  --  35.9*  --   --  33.0*  --   --   PLT 380  --  387  --   --  194  --   --   LABPROT 44.9*  --  17.5*  --   --   --  19.7*  --   INR 4.65*  --  1.42  --   --   --  1.64  --   HEPARINUNFRC  --   --   --  <0.10*  --   --  0.23* 0.49  CREATININE  --   < > 1.79*  --  1.73* 1.57*  --   --   < > = values in this interval not displayed.  Estimated Creatinine Clearance: 36.3 mL/min (A) (by C-G formula based on SCr of 1.57 mg/dL (H)).  Assessment: Heparin being used for AFib. Heparin level had been low this morning; however, after rate increase came back within goal range at 0.49. Patient is no longer intubated. No signs or symptoms of bleeding noted in the chart. H/H and platelets remain stable.   Goal of Therapy: Heparin level 0.3-0.7 units/ml Monitor platelets by anticoagulation protocol: Yes   Plan:  -Continue heparin at 1750 units/hr -Monitor daily heparin levels, CBC  Doylene Canard, PharmD Clinical Pharmacist  Pager: [phone]

## 2017-03-20 NOTE — Progress Notes (Signed)
ANTICOAGULATION CONSULT NOTE - Follow Up Consult  Pharmacy Consult for Heparin  Indication: atrial fibrillation  No Known Allergies  Patient Measurements: Height: 6' (182.9 cm) Weight: 189 lb 9.5 oz (86 kg) IBW/kg (Calculated) : 73.1  Vital Signs: Temp: 97.8 F (36.6 C) (08/17 0402) Temp Source: Oral (08/17 0402) BP: 87/52 (08/17 0600) Pulse Rate: 95 (08/17 0600)  Labs:  Recent Labs  03/18/17 0508  03/19/17 0422 03/19/17 1600 03/19/17 1859 03/20/17 0432 03/20/17 0610  HGB 10.8*  --  10.8*  --   --  10.1*  --   HCT 34.0*  --  35.9*  --   --  33.0*  --   PLT 380  --  387  --   --  194  --   LABPROT 44.9*  --  17.5*  --   --   --  19.7*  INR 4.65*  --  1.42  --   --   --  1.64  HEPARINUNFRC  --   --   --  <0.10*  --   --  0.23*  CREATININE  --   < > 1.79*  --  1.73* 1.57*  --   < > = values in this interval not displayed.  Estimated Creatinine Clearance: 36.3 mL/min (A) (by C-G formula based on SCr of 1.57 mg/dL (H)).  Assessment: Heparin for afib, warfarin reversed with Vit K, INR 1.64 this AM, heparin level low  Goal of Therapy: Heparin level 0.3-0.7 units/ml Monitor platelets by anticoagulation protocol: Yes   Plan:  -Inc heparin to 1750 units/hr -1500 HL  Narda Bonds 03/20/2017,7:02 AM

## 2017-03-20 NOTE — Procedures (Signed)
Extubation Procedure Note  Patient Details:   Name: Charlotte [name] DOB: 1941-03-13 MRN: [id number]   Airway Documentation:     Evaluation  O2 sats: stable throughout Complications: No apparent complications Patient did tolerate procedure well. Bilateral Breath Sounds: Rhonchi, Diminished   Yes   Patient tolerated wean. MD ordered to extubate. Positive for cuff leak. Patient extubated to a 2 Lpm nasal cannula. No signs of dyspnea or stridor. Patient instructed on the usage of the Incentive Spirometer achieving 1000 mL, six times. RN at bedside.   Myrtie Neither 03/20/2017, 10:50 AM

## 2017-03-20 NOTE — Progress Notes (Signed)
PT Cancellation Note  Patient Details Name: Charlotte [name] MRN: [id number] DOB: [birth year]   Cancelled Treatment:    Reason Eval/Treat Not Completed: Medical issues which prohibited therapy (Pt extubated 1035.  Will check back tomorrow.  thanks. )   Denice Paradise 03/20/2017, 12:13 PM Vernice Bowker,PT Acute Rehabilitation [phone] (pager)

## 2017-03-20 NOTE — Progress Notes (Signed)
Nutrition Follow Up  INTERVENTION:    PO diet advancement vs need for TF re-initiation. RD to follow for nutrition care plan.  NUTRITION DIAGNOSIS:   Increased nutrient needs related to acute illness as evidenced by estimated needs, ongoing  GOAL:   Patient will meet greater than or equal to 90% of their needs, currently unmet  MONITOR:   Diet advancement, PO intake, Labs, Weight trends, Skin, I & O's  ASSESSMENT:   Pt with PMH of Afib, PVD, CHF on home O2 admitted with R limb ischemia on 8/1, s/p fem to tp trunk bypass 8/8.   Pt extubated this AM. TF (Vital AF 1.2) discontinued via OGT. Not medically ready for bedside swallow evaluation. Labs and medications reviewed. K 5.2 (H). CBG's [id number].  Diet Order:  Diet NPO time specified  Skin:   (R groin and leg incisions)  Last BM:  8/17  Height:   Ht Readings from Last 1 Encounters:  03/20/17 6' (1.829 m)    Weight:   Wt Readings from Last 1 Encounters:  03/20/17 189 lb 9.5 oz (86 kg)  Admission weight: 154 lb (69.9 kg)  Ideal Body Weight:  72.7 kg  BMI:  Body mass index is 25.71 kg/m.  Estimated Nutritional Needs:   Kcal:  1800-2000  Protein:  105-115 gm  Fluid:  1.8-2.0 L/day  EDUCATION NEEDS:   No education needs identified at this time  Arthur Holms, RD, LDN Pager #: [phone] After-Hours Pager #: [phone]

## 2017-03-20 NOTE — Progress Notes (Signed)
SLP Cancellation Note  Patient Details Name: VICKTORIA MUCKEY MRN: [id number] DOB: 02/18/1941   Cancelled treatment:       Reason Eval/Treat Not Completed: Patient not medically ready (extubated at 32 following 9 days intubated). ST will continue to follow for readiness for BSE.  Celia B. [place], [facility], [place]  Shonna Chock 03/20/2017, 10:58 AM

## 2017-03-20 NOTE — Progress Notes (Signed)
eLink Physician-Brief Progress Note Patient Name: Charlotte [name] DOB: [birth year] MRN: [id number]   Date of Service  03/20/2017  HPI/Events of Note  Hypoglycemia  eICU Interventions  D10 drip     Intervention Category Evaluation Type: Other  Gibson Lad 03/20/2017, 7:57 PM

## 2017-03-20 NOTE — Care Management Note (Signed)
Case Management Note [name] RN, BSN Unit 4E-Case Manager [phone]  Patient Details  Name: Charlotte [name] MRN: [id number] Date of Birth: 24-Sep-1940  Subjective/Objective:     Pt admitted with LE arterial insufficiency- plan for OR on Thur 8/9 for  Fem-tibioperoneal bypass for attempted limb salvage- remains on IV heparin              Action/Plan: PTA pt lived at home with spouse- will need PT/OT evals when able to participate to assist in d/c planning- pt may be eligible for Home First program with [facility]- CM will continue to follow post op for d/c needs and plan.   Expected Discharge Date:                 Expected Discharge Plan:  [place]  In-House Referral:  Clinical Social Work  Discharge planning Services  CM Consult  Post Acute Care Choice:    Choice offered to:     DME Arranged:    DME Agency:     HH Arranged:    [place] Agency:     Status of Service:  In process, will continue to follow  If discussed at Long Length of Stay Meetings, dates discussed:  8/7  Discharge Disposition:   Additional Comments: 03/20/2017  Pt extubated today.  PT eval pending  03/19/17 Discussed in LOS 8/16 - pt remains appropriate for continued stay.  Pt remains on ventilator and not following commands  03/18/17 Informed by [facility] that pt was referred to them by Vascular Surgery for  [facility], PT and OT if needed.    Discussed in LOS 8/14 - pt remains appropriate for continued stay.  LTACH referral given during LOS - however attending has deemed LTACH referral at this time to be inappropriate; pt remains on ETT vent and not stable to transfer. [name], RN 03/20/2017, 3:48 PM

## 2017-03-20 NOTE — Progress Notes (Signed)
PULMONARY / CRITICAL CARE MEDICINE   Name: Charlotte [name] MRN: [id number] DOB: Jun 22, 1941    ADMISSION DATE:  03/04/2017   CONSULTATION DATE:  03/11/2017  REFERRING MD:  Windell Moulding  CHIEF COMPLAINT:  Leg pain  BRIEF HPI:  76 y.o.   female with known atrial fibrillation, polycythemia, and peripheral vascular disease. Patient also has known congestive heart failure and is on chronic oxygen therapy. Admitted on 8/1 with limb ischemia and subsequently pain. Underwent bypass on 8/8 and return to the intensive care unit intubated.  SUBJECTIVE:   RN reports no acute events.  RT reports pt weaning on 5/5, no distress.  On 1 mcg levophed.   VITAL SIGNS: BP 102/62   Pulse (!) 104   Temp (!) 97.5 F (36.4 C) (Oral)   Resp 10   Ht 6' (1.829 m)   Wt 189 lb 9.5 oz (86 kg)   SpO2 98%   BMI 25.71 kg/m   HEMODYNAMICS:    VENTILATOR SETTINGS: Vent Mode: PSV;CPAP FiO2 (%):  [30 %] 30 % Set Rate:  [16 bmp] 16 bmp Vt Set:  [600 mL] 600 mL PEEP:  [5 cmH20] 5 cmH20 Pressure Support:  [5 cmH20-10 cmH20] 5 cmH20 Plateau Pressure:  [13 cmH20-17 cmH20] 15 cmH20  INTAKE / OUTPUT: I/O last 3 completed shifts: In: 1504.5 [I.V.:594.5; NG/GT:510; IV Piggyback:400] Out: 64 [Urine:8350; Stool:550]  PHYSICAL EXAMINATION: General: elderly female in NAD on vent  HEENT: MM pink/moist, ETT Neuro: Awake, alert, follows commands, lifts head off pillow CV: s1s2 rrr, no m/r/g PULM: even/non-labored, lungs bilaterally clear WU:[id number], non-tender, bsx4 active  Extremities: warm/dry, trace generalized edema, RLE with surgical incision, skin erythematous on RLE, dusky toes Skin: no rashes  LABS:  BMET  Recent Labs Lab 03/19/17 0422 03/19/17 1859 03/20/17 0432  NA 139 141 141  K 5.1 2.7* 5.2*  CL 108 108 108  CO2 19* 23 22  BUN 79* 76* 75*  CREATININE 1.79* 1.73* 1.57*  GLUCOSE 103* 102* 91    Electrolytes  Recent Labs Lab 03/18/17 0752 03/18/17 1919 03/19/17 0422 03/19/17 1859  03/20/17 0432  CALCIUM 7.9* 8.0* 7.9* 8.0* 8.0*  MG 1.6* 2.1 2.1  --  1.9  PHOS 4.2  --  4.2  --  4.0    CBC  Recent Labs Lab 03/18/17 0508 03/19/17 0422 03/20/17 0432  WBC 19.8* 26.4* 22.5*  HGB 10.8* 10.8* 10.1*  HCT 34.0* 35.9* 33.0*  PLT 380 387 194    Coag's  Recent Labs Lab 03/18/17 0508 03/19/17 0422 03/20/17 0610  INR 4.65* 1.42 1.64    Sepsis Markers  Recent Labs Lab 03/14/17 0418  PROCALCITON 8.73    ABG  Recent Labs Lab 03/13/17 1553 03/14/17 0424 03/16/17 1031  PHART 7.396 7.338* 7.438  PCO2ART 23.7* 25.0* 24.1*  PO2ART 68.0* 73.0* 125.0*    Liver Enzymes  Recent Labs Lab 03/18/17 0752 03/19/17 0422 03/20/17 0432  AST 47* 50* 74*  ALT 180* 134* 85*  ALKPHOS 57 67 73  BILITOT 1.2 1.6* 1.9*  ALBUMIN 1.9* 2.1* 1.9*    Cardiac Enzymes No results for input(s): TROPONINI, PROBNP in the last 168 hours.  Glucose  Recent Labs Lab 03/19/17 1251 03/19/17 1517 03/19/17 1927 03/19/17 2345 03/20/17 0342 03/20/17 0848  GLUCAP 105* 113* 102* 103* 102* 77    Imaging Dg Chest Port 1 View  Result Date: 03/15/2017 CLINICAL DATA:  Acute respiratory failure. EXAM: PORTABLE CHEST 1 VIEW COMPARISON:  One-view chest x-ray the one-view chest x-ray 03/14/2017  FINDINGS: Endotracheal tube is stable, 5 cm above the carina. Side port of the NG tube is just above the GE junction and could be advanced for more optimal positioning. A left subclavian line is stable. Cardiac enlargement, mild edema, and bilateral effusions are again noted. Bibasilar airspace disease is present, likely reflecting atelectasis. There is no significant interval change. IMPRESSION: 1. Stable cardiomegaly, mild edema, and pleural effusions compatible with congestive heart failure. 2. Support apparatus is stable. 3. The side port of the NG tube is likely just above the GE junction and could be advanced 6-10 cm for more optimal positioning. Electronically Signed   By: San Morelle M.D.   On: 03/15/2017 07:25     IMAGING/STUDIES: PFT 08/30/13: FVC 3.57 L (95%) FEV1 2.38 L (83%) FEV1/FVC 0.66 FEF 25-75 1.34 L (60%) negative bronchodilator response TLC 101% RV 105% DLCO uncorrected 34% RENAL U/S 8/6:   IMPRESSION: 1. No evidence of hydronephrosis. 2. Mildly increased renal parenchymal echogenicity may reflect medical renal disease. TTE 8/10:  LV normal in size with EF 65-70%. No regional wall motion abnormalities. Unable to assess diastolic function due to atrial fibrillation. LA & RA severely dilated. RV moderately dilated with moderate to severely reduced systolic function. No aortic stenosis or regurgitation. Aortic root normal in size. Mild mitral regurgitation without stenosis. Ventricular septum with diastolic flattening and systolic flattening. Moderate tricuspid regurgitation. No pulmonic stenosis or regurgitation. Trivial pericardial effusion. PORT CXR 8/11:  Endotracheal tube and central  catheter in good position. Enteric feeding tube coursing low diaphragm. Hazy bilateral lower lung opacification consistent with pleural effusions. PORT ABD X-RAY 8/12: IMPRESSION: 1. Nasogastric tube tip in the proximal stomach and side hole in the region of the gastroesophageal junction. 2. Curvilinear density in the inferior pelvis in the expected position of the urinary bladder, most likely representing a Foley catheter. A rectal tube is also a possibility. 3. Cardiomegaly and bilateral pleural effusions. PORT CXR 8/12:  Persistent bilateral lower lung hazy opacification consistent with pleural effusion. Endotracheal tube approximately 2 cm too high. Enteric feeding tube with side-port at gastroesophageal junction. RUQ U/S 8/13: IMPRESSION: 1. Biliary sludge and small gallstones within the gallbladder lumen. 2. No evidence of biliary obstruction. 3. Liver parenchyma mildly heterogeneous without evidence of significant steatosis or cirrhosis. A small amount of ascites is  present as well as a right pleural effusion. PORT CXR 8/14: Endotracheal tube and central venous catheter in good position. Enteric feeding tube coursing low diaphragm. Continue to hazy bilateral lower lung opacities consistent with pleural effusions. CT ABD/PELVIS W/O CONTRAST 8/14: IMPRESSION: 1. No explanation for bilious vomiting.  No bowel obstruction. 2. Findings consistent with fluid overload with body wall edema, third-spacing, small amount of abdominopelvic ascites and moderate to large bilateral pleural effusions. 3. Cortical nephrocalcinosis. 4. Gallstones/sludge. 5. Advanced aortic atherosclerosis.  MICROBIOLOGY: MRSA PCR 8/3:  Negative  Tracheal Aspirate Culture 8/8:  Klebsiella pneumoniae Tracheal Aspirate Culture 8/9:  Klebsiella pneumoniae  ANTIBIOTICS: Cefuroxime 8/8 (periop) Cefepime 8/9 - 8/11 Rocephin 8/11 - 8/12 Vancomycin 8/9 - 8/10; Vancomycin 8/13 >>  Cipro 8/13 >>  SIGNIFICANT EVENTS: 08/01 - Admit 08/08 - RLE Fem-Pop bypass & returned to ICU intubated 08/09 - On Neo-Synephrine w/ oliguria & increasing oxygen needs 08/12 - N/V overnight >> tube feedings on hold 08/13 - Changed from Rocephin to Cipro w/ increasing LFTs 08/15 - Vitamin K 1mg  IV for INR >4.0 08/16 - N/V, CT abd negative   LINES/TUBES: OETT 8/8 >>> 8/17 L [place] CVL  8/9 >>> R RADIAL ART LINE 8/8 >>> out Foley >>>  OGT >>> 8/17 PIV  ASSESSMENT / PLAN:  PULMONARY A: Acute on chronic hypoxic respiratory failure: Multifactorial from pneumonia, pleural effusions, and pulmonary edema. Bilateral pleural effusions: Deferring thoracentesis given coagulopathy.    P:   Meets criteria for extubation, proceed 8/17 Pulmonary hygiene - IS, mobilize  Monitor intermittent CXR  Monitor effusions > defer thora given coagulopathy   CARDIOVASCULAR A:  Shock: Likely multifactorial but primarily sepsis. Improving. Atrial fibrillation Acute diastolic congestive heart failure Limb Ischemia:  S/P  bypass.  P:  Cardiology following  Lasix per Cardiology  Amiodarone PO  Tele monitoring  Vascular surgery following   RENAL A:   Hypokalemia: Replaced. Hypomagnesemia: Replaced.  Acute on chronic renal failure: Slowly improving. Metabolic acidosis:  Stable.   P:   Trend BMP / urinary output Replace electrolytes as indicated Avoid nephrotoxic agents, ensure adequate renal perfusion   GASTROINTESTINAL A:   Transaminitis: Likely secondary to shock liver. Improving.  Nausea with vomiting: Unclear etiology. Possibly secondary to transaminitis.   P:   Discontinue TF with extubation  SLP evaluation post extubation given duration of vent needs NPO  Trend LFT's   HEMATOLOGIC A:   Coagulopathy: improved, received vitamin K. Known history of chronic Coumadin. Polycythemia Vera Leukocytosis: Steadily improving. Likely secondary to sepsis. Anemia: Hemoglobin stable. No evidence of active bleeding.Marland Kitchen History of DVT  P:  Heparin gtt per pharmacy  Trend CBC  Transfuse per ICU guidelines   INFECTIOUS A:   Sepsis Klebsiella pneumonia Ischemic right foot/cellulitis  P:   Continue Cipro, D8/10  Repeat cultures if new fever  Empiric vancomycin for LE cellulitis, D5/x   ENDOCRINE A:   Hypothyroidism  P:   Continue synthroid    NEUROLOGIC A:   Sedation on ventilator Pain control postoperatively  P:   RASS goal: 0  PRN low dose fentanyl for pain post-op    Prophylaxis:   Heparin drip per pharmacy consult. Protonix IV daily. Diet:  NPO. SLP evaluation pending.  Code Status:  Full Code. Disposition:  ICU Family Update: No family at bedside am 8/17   CC Time: 24 minutes   Noe Gens, NP-C [facility] Pgr: [phone] or if no answer [phone] 03/20/2017, 10:13 AM

## 2017-03-20 NOTE — Progress Notes (Signed)
  Progress Note    03/20/2017 7:43 AM 9 Days Post-Op  Subjective:  Intubated but alert  Vitals:   03/20/17 0530 03/20/17 0600  BP: (!) 81/57 (!) 87/52  Pulse:  95  Resp: 16 16  Temp:    SpO2:  94%    Physical Exam: Alert and following commands Abdomen is soft Incisions are cdi Right foot has dusky 4th/5th toes with blistering lateral foot and leg Right foot cap refill is brisk strong left pt and dp signals She has minimal dorsiflexion of the right foot but 3/5 plantar flexion, no movement of toes. Sensory exam equivocal as patient remains intubated  CBC    Component Value Date/Time   WBC 22.5 (H) 03/20/2017 0432   RBC 4.42 03/20/2017 0432   HGB 10.1 (L) 03/20/2017 0432   HCT 33.0 (L) 03/20/2017 0432   PLT 194 03/20/2017 0432   MCV 74.7 (L) 03/20/2017 0432   MCH 22.9 (L) 03/20/2017 0432   MCHC 30.6 03/20/2017 0432   RDW 25.4 (H) 03/20/2017 0432   LYMPHSABS 1.1 03/19/2017 0422   MONOABS 2.4 (H) 03/19/2017 0422   EOSABS 0.5 03/19/2017 0422   BASOSABS 0.0 03/19/2017 0422    BMET    Component Value Date/Time   NA 141 03/20/2017 0432   K 5.2 (H) 03/20/2017 0432   CL 108 03/20/2017 0432   CO2 22 03/20/2017 0432   GLUCOSE 91 03/20/2017 0432   BUN 75 (H) 03/20/2017 0432   CREATININE 1.57 (H) 03/20/2017 0432   CREATININE 1.21 (H) 08/14/2016 0733   CALCIUM 8.0 (L) 03/20/2017 0432   GFRNONAA 31 (L) 03/20/2017 0432   GFRAA 36 (L) 03/20/2017 0432    INR    Component Value Date/Time   INR 1.64 03/20/2017 0610     Intake/Output Summary (Last 24 hours) at 03/20/17 0743 Last data filed at 03/20/17 0700  Gross per 24 hour  Intake          1086.34 ml  Output             5050 ml  Net         -3963.66 ml    Assessment:  76 y.o. female is s/p right femoral to tibial artery bypass  Plan: Vent management per pccm is appreciated Will allow right foot to demarcate Foot drop boot ordered Continue aspirin consider heparin drip    Kentaro Alewine C. [name], MD [facility] Office: [phone] Pager: [phone]  03/20/2017 7:43 AM

## 2017-03-20 NOTE — Progress Notes (Signed)
Noted that patient was extubated - agree with continued diuresis as creatinine is improving. On oral amiodarone, would continue current dose. On IV heparin, pending vascular surgery - plan to transition to either warfarin or NOAC if no surgery is planned in the near future. Cardiology will be available as needed over the weekend.  Pixie Casino, MD, [place]  Attending Cardiologist  Direct Dial: [phone]  Fax: [phone]  Website:  [email or web address]

## 2017-03-21 ENCOUNTER — Inpatient Hospital Stay (HOSPITAL_COMMUNITY): Payer: Medicare Other

## 2017-03-21 LAB — CBC
HCT: 36.6 % (ref 36.0–46.0)
Hemoglobin: 11 g/dL — ABNORMAL LOW (ref 12.0–15.0)
MCH: 23 pg — ABNORMAL LOW (ref 26.0–34.0)
MCHC: 30.1 g/dL (ref 30.0–36.0)
MCV: 76.6 fL — ABNORMAL LOW (ref 78.0–100.0)
PLATELETS: 136 10*3/uL — AB (ref 150–400)
RBC: 4.78 MIL/uL (ref 3.87–5.11)
RDW: 25.9 % — ABNORMAL HIGH (ref 11.5–15.5)
WBC: 21.2 10*3/uL — AB (ref 4.0–10.5)

## 2017-03-21 LAB — MAGNESIUM: MAGNESIUM: 1.7 mg/dL (ref 1.7–2.4)

## 2017-03-21 LAB — BASIC METABOLIC PANEL
ANION GAP: 8 (ref 5–15)
ANION GAP: 10 (ref 5–15)
Anion gap: 10 (ref 5–15)
BUN: 58 mg/dL — ABNORMAL HIGH (ref 6–20)
BUN: 49 mg/dL — AB (ref 6–20)
BUN: 48 mg/dL — AB (ref 6–20)
CO2: 29 mmol/L (ref 22–32)
CO2: 26 mmol/L (ref 22–32)
CO2: 26 mmol/L (ref 22–32)
CREATININE: 1.28 mg/dL — AB (ref 0.44–1.00)
Calcium: 8.2 mg/dL — ABNORMAL LOW (ref 8.9–10.3)
Calcium: 8.1 mg/dL — ABNORMAL LOW (ref 8.9–10.3)
Calcium: 8.1 mg/dL — ABNORMAL LOW (ref 8.9–10.3)
Chloride: 106 mmol/L (ref 101–111)
Chloride: 104 mmol/L (ref 101–111)
Chloride: 103 mmol/L (ref 101–111)
Creatinine, Ser: 1.34 mg/dL — ABNORMAL HIGH (ref 0.44–1.00)
Creatinine, Ser: 1.23 mg/dL — ABNORMAL HIGH (ref 0.44–1.00)
GFR calc Af Amer: 47 mL/min — ABNORMAL LOW (ref >60)
GFR calc Af Amer: 44 mL/min — ABNORMAL LOW (ref >60)
GFR calc Af Amer: 49 mL/min — ABNORMAL LOW (ref >60)
GFR, EST NON AFRICAN AMERICAN: 40 mL/min — AB (ref >60)
GFR, EST NON AFRICAN AMERICAN: 38 mL/min — AB (ref >60)
GFR, EST NON AFRICAN AMERICAN: 42 mL/min — AB (ref >60)
GLUCOSE: 76 mg/dL (ref 65–99)
Glucose, Bld: 133 mg/dL — ABNORMAL HIGH (ref 65–99)
Glucose, Bld: 148 mg/dL — ABNORMAL HIGH (ref 65–99)
POTASSIUM: 2.7 mmol/L — AB (ref 3.5–5.1)
POTASSIUM: 3.8 mmol/L (ref 3.5–5.1)
Potassium: 2.1 mmol/L — CL (ref 3.5–5.1)
SODIUM: 143 mmol/L (ref 135–145)
SODIUM: 140 mmol/L (ref 135–145)
Sodium: 139 mmol/L (ref 135–145)

## 2017-03-21 LAB — GLUCOSE, CAPILLARY
GLUCOSE-CAPILLARY: 93 mg/dL (ref 65–99)
GLUCOSE-CAPILLARY: 87 mg/dL (ref 65–99)
GLUCOSE-CAPILLARY: 76 mg/dL (ref 65–99)
Glucose-Capillary: 101 mg/dL — ABNORMAL HIGH (ref 65–99)
Glucose-Capillary: 40 mg/dL — CL (ref 65–99)
Glucose-Capillary: 73 mg/dL (ref 65–99)
Glucose-Capillary: 86 mg/dL (ref 65–99)

## 2017-03-21 LAB — PROTIME-INR
INR: 1.86
Prothrombin Time: 21.7 seconds — ABNORMAL HIGH (ref 11.4–15.2)

## 2017-03-21 LAB — PHOSPHORUS: PHOSPHORUS: 2.9 mg/dL (ref 2.5–4.6)

## 2017-03-21 LAB — VANCOMYCIN, RANDOM: VANCOMYCIN RM: 16

## 2017-03-21 LAB — HEPARIN LEVEL (UNFRACTIONATED): HEPARIN UNFRACTIONATED: 0.54 [IU]/mL (ref 0.30–0.70)

## 2017-03-21 MED ORDER — ORAL CARE MOUTH RINSE
15.0000 mL | Freq: Two times a day (BID) | OROMUCOSAL | Status: DC
  Administered 2017-03-21: 15 mL via OROMUCOSAL

## 2017-03-21 MED ORDER — DEXTROSE 50 % IV SOLN
25.0000 g | Freq: Once | INTRAVENOUS | Status: AC
  Administered 2017-03-21: 25 g via INTRAVENOUS
  Filled 2017-03-21: qty 50

## 2017-03-21 MED ORDER — POTASSIUM CHLORIDE 10 MEQ/100ML IV SOLN: 10.0000 meq | INTRAVENOUS | Status: DC

## 2017-03-21 MED ORDER — CHLORHEXIDINE GLUCONATE 0.12 % MT SOLN
15.0000 mL | Freq: Two times a day (BID) | OROMUCOSAL | Status: DC
  Administered 2017-03-21 (×2): 15 mL via OROMUCOSAL
  Filled 2017-03-21: qty 15

## 2017-03-21 MED ORDER — MORPHINE SULFATE (PF) 4 MG/ML IV SOLN
2.0000 mg | INTRAVENOUS | Status: DC | PRN
  Administered 2017-03-21: 2 mg via INTRAVENOUS
  Administered 2017-03-21: 1 mg via INTRAVENOUS
  Administered 2017-03-21: 4 mg via INTRAVENOUS
  Administered 2017-03-21: 2 mg via INTRAVENOUS
  Administered 2017-03-23: 4 mg via INTRAVENOUS
  Administered 2017-03-23 – 2017-03-25 (×5): 2 mg via INTRAVENOUS
  Administered 2017-03-26: 4 mg via INTRAVENOUS
  Filled 2017-03-21 (×12): qty 1

## 2017-03-21 MED ORDER — GABAPENTIN 250 MG/5ML PO SOLN
100.0000 mg | Freq: Three times a day (TID) | ORAL | Status: DC
  Administered 2017-03-21: 100 mg via ORAL
  Filled 2017-03-21 (×4): qty 2

## 2017-03-21 MED ORDER — RESOURCE THICKENUP CLEAR PO POWD
ORAL | Status: DC | PRN
  Filled 2017-03-21 (×2): qty 125

## 2017-03-21 MED ORDER — VANCOMYCIN HCL IN DEXTROSE 750-5 MG/150ML-% IV SOLN
750.0000 mg | Freq: Once | INTRAVENOUS | Status: AC
  Administered 2017-03-21: 750 mg via INTRAVENOUS
  Filled 2017-03-21: qty 150

## 2017-03-21 MED ORDER — CIPROFLOXACIN IN D5W 400 MG/200ML IV SOLN
400.0000 mg | Freq: Two times a day (BID) | INTRAVENOUS | Status: DC
  Administered 2017-03-22: 400 mg via INTRAVENOUS
  Filled 2017-03-21 (×2): qty 200

## 2017-03-21 MED ORDER — POTASSIUM CHLORIDE 10 MEQ/50ML IV SOLN
10.0000 meq | INTRAVENOUS | Status: AC
  Administered 2017-03-21 (×6): 10 meq via INTRAVENOUS
  Filled 2017-03-21 (×6): qty 50

## 2017-03-21 MED ORDER — POTASSIUM CHLORIDE 10 MEQ/50ML IV SOLN
10.0000 meq | INTRAVENOUS | Status: AC
  Administered 2017-03-21 (×5): 10 meq via INTRAVENOUS
  Filled 2017-03-21 (×5): qty 50

## 2017-03-21 MED ORDER — STARCH (THICKENING) PO POWD
ORAL | Status: DC | PRN
  Filled 2017-03-21: qty 227

## 2017-03-21 MED ORDER — GABAPENTIN 100 MG PO CAPS
100.0000 mg | ORAL_CAPSULE | Freq: Three times a day (TID) | ORAL | Status: DC
  Administered 2017-03-21 – 2017-03-25 (×13): 100 mg via ORAL
  Filled 2017-03-21 (×13): qty 1

## 2017-03-21 NOTE — Progress Notes (Signed)
CRITICAL VALUE ALERT   Critical lab    CBG 40  Pt awake and alert, asympotomatic Date & Time Notied:   03/21/2017 0323  Provider Notified:  Dr  Lamonte Sakai  Orders Received/Actions taken: Dextrose 50% 25 gm given per protocol  CBG recheck at  0341 101    No further orders received

## 2017-03-21 NOTE — Progress Notes (Signed)
03/21/2017 1430 During patient care, brace for RLE removed and dressings changed. Doppler pulses found RLE. RN noted area of skin breakdown located along area where straps for leg brace touched skin. Foam dressing applied to site. Straps for brace loosened to protect pt. Skin integrity. Will continue to closely monitor patient.

## 2017-03-21 NOTE — Progress Notes (Signed)
Pharmacy Antibiotic Note  Charlotte [name] is a 76 y.o. female admitted on 03/04/2017 with pneumonia and cellulitis.  Pharmacy has been consulted for ciprofloxacin and vancomycin dosing. Vancomycin trough today within goal range.  Plan: -Redose vancomycin 750 mg x 1 for now.  F/u Scr tomorrow for further dosing. -Increase ciprofloxacin to 400 mg IV BID.  -Monitor renal function, cultures, LOT   Height: 6' (182.9 cm) Weight: 179 lb 0.2 oz (81.2 kg) IBW/kg (Calculated) : 73.1  Temp (24hrs), Avg:97.5 F (36.4 C), Min:97 F (36.1 C), Max:97.7 F (36.5 C)   Recent Labs Lab 03/17/17 0528  03/18/17 0508  03/18/17 1919 03/19/17 0422 03/19/17 1050 03/19/17 1859 03/20/17 0432 03/21/17 0356 03/21/17 1216  WBC 23.9*  --  19.8*  --   --  26.4*  --   --  22.5*  --  21.2*  CREATININE  --   < >  --   < > 1.89* 1.79*  --  1.73* 1.57* 1.28*  --   VANCOTROUGH  --   --   --   --   --   --  31*  --   --   --   --   VANCORANDOM  --   --   --   --   --   --   --   --   --  16  --   < > = values in this interval not displayed.  Estimated Creatinine Clearance: 44.5 mL/min (A) (by C-G formula based on SCr of 1.28 mg/dL (H)).    No Known Allergies  Antimicrobials Vanc 8/9 >> 8/11; 8/13>> Cefepime 8/9 >> 8/11 Ceftriaxone 8/11 >>8/13 Cipro 8/13>>  Dose Adjustments 8/16 VT = 31 - hold vancomycin 8/18 VR = 16 - redose 750 mg x 1  Microbiology 8/9 Trach asp >> rare yeast, mod Kleb  8/8 Trach asp >> rare GPC, rare Kleb 8/3 MRSA PCR: neg  Thank you for allowing pharmacy to be a part of this patient's care.  Uvaldo Rising, BCPS  Clinical Pharmacist Pager [phone]  03/21/2017 3:28 PM

## 2017-03-21 NOTE — Progress Notes (Signed)
PULMONARY / CRITICAL CARE MEDICINE   Name: Charlotte [name] MRN: [id number] DOB: Sep 29, 1940    ADMISSION DATE:  03/04/2017   CONSULTATION DATE:  03/11/2017  REFERRING MD:  Windell Moulding  CHIEF COMPLAINT:  Leg pain  BRIEF HPI:  76 y.o.   female with known atrial fibrillation, polycythemia, and peripheral vascular disease. Patient also has known congestive heart failure and is on chronic oxygen therapy. Admitted on 8/1 with limb ischemia and subsequently pain. Underwent bypass on 8/8 and return to the intensive care unit intubated.  SUBJECTIVE:   Extubated yesterday. Mild delirium but otherwise she is stable On D10 due to hypoglycemia.   VITAL SIGNS: BP (!) 87/70   Pulse (!) 111   Temp 97.6 F (36.4 C) (Oral)   Resp 17   Ht 6' (1.829 m)   Wt 81.2 kg (179 lb 0.2 oz)   SpO2 96%   BMI 24.28 kg/m   HEMODYNAMICS:    VENTILATOR SETTINGS: Vent Mode: PSV;CPAP FiO2 (%):  [30 %] 30 % PEEP:  [5 cmH20] 5 cmH20 Pressure Support:  [5 cmH20] 5 cmH20  INTAKE / OUTPUT: I/O last 3 completed shifts: In: 2138.3 [P.O.:120; I.V.:1214.9; NG/GT:303.3; IV Piggyback:500] Out: 9400 [Urine:9250; Stool:150]  PHYSICAL EXAMINATION: Gen:      No acute distress HEENT:  EOMI, sclera anicteric Neck:     No masses; no thyromegaly Lungs:    Clear to auscultation bilaterally; normal respiratory effort CV:         Regular rate and rhythm; no murmurs Abd:      + bowel sounds; soft, non-tender; no palpable masses, no distension Ext:    No edema; adequate peripheral perfusion, RLE incision is clean Skin:      Warm and dry; no rash Neuro: No focal deficits  LABS:  BMET  Recent Labs Lab 03/19/17 1859 03/20/17 0432 03/21/17 0356  NA 141 141 143  K 2.7* 5.2* 2.1*  CL 108 108 106  CO2 23 22 29   BUN 76* 75* 58*  CREATININE 1.73* 1.57* 1.28*  GLUCOSE 102* 91 133*    Electrolytes  Recent Labs Lab 03/19/17 0422 03/19/17 1859 03/20/17 0432 03/21/17 0356  CALCIUM 7.9* 8.0* 8.0* 8.2*  MG 2.1   --  1.9 1.7  PHOS 4.2  --  4.0 2.9    CBC  Recent Labs Lab 03/18/17 0508 03/19/17 0422 03/20/17 0432  WBC 19.8* 26.4* 22.5*  HGB 10.8* 10.8* 10.1*  HCT 34.0* 35.9* 33.0*  PLT 380 387 194    Coag's  Recent Labs Lab 03/19/17 0422 03/20/17 0610 03/21/17 0356  INR 1.42 1.64 1.86    Sepsis Markers No results for input(s): LATICACIDVEN, PROCALCITON, O2SATVEN in the last 168 hours.  ABG  Recent Labs Lab 03/16/17 1031  PHART 7.438  PCO2ART 24.1*  PO2ART 125.0*    Liver Enzymes  Recent Labs Lab 03/18/17 0752 03/19/17 0422 03/20/17 0432  AST 47* 50* 74*  ALT 180* 134* 85*  ALKPHOS 57 67 73  BILITOT 1.2 1.6* 1.9*  ALBUMIN 1.9* 2.1* 1.9*    Cardiac Enzymes No results for input(s): TROPONINI, PROBNP in the last 168 hours.  Glucose  Recent Labs Lab 03/20/17 1921 03/20/17 2024 03/20/17 2328 03/21/17 0318 03/21/17 0340 03/21/17 0738  GLUCAP 63* 74 83 40* 101* 93    Imaging Dg Chest Port 1 View  Result Date: 03/15/2017 CLINICAL DATA:  Acute respiratory failure. EXAM: PORTABLE CHEST 1 VIEW COMPARISON:  One-view chest x-ray the one-view chest x-ray 03/14/2017 FINDINGS: Endotracheal tube is  stable, 5 cm above the carina. Side port of the NG tube is just above the GE junction and could be advanced for more optimal positioning. A left subclavian line is stable. Cardiac enlargement, mild edema, and bilateral effusions are again noted. Bibasilar airspace disease is present, likely reflecting atelectasis. There is no significant interval change. IMPRESSION: 1. Stable cardiomegaly, mild edema, and pleural effusions compatible with congestive heart failure. 2. Support apparatus is stable. 3. The side port of the NG tube is likely just above the GE junction and could be advanced 6-10 cm for more optimal positioning. Electronically Signed   By: San Morelle M.D.   On: 03/15/2017 07:25     IMAGING/STUDIES: PFT 08/30/13: FVC 3.57 L (95%) FEV1 2.38 L (83%)  FEV1/FVC 0.66 FEF 25-75 1.34 L (60%) negative bronchodilator response TLC 101% RV 105% DLCO uncorrected 34% RENAL U/S 8/6:   IMPRESSION: 1. No evidence of hydronephrosis. 2. Mildly increased renal parenchymal echogenicity may reflect medical renal disease. TTE 8/10:  LV normal in size with EF 65-70%. No regional wall motion abnormalities. Unable to assess diastolic function due to atrial fibrillation. LA & RA severely dilated. RV moderately dilated with moderate to severely reduced systolic function. No aortic stenosis or regurgitation. Aortic root normal in size. Mild mitral regurgitation without stenosis. Ventricular septum with diastolic flattening and systolic flattening. Moderate tricuspid regurgitation. No pulmonic stenosis or regurgitation. Trivial pericardial effusion. PORT CXR 8/11:  Endotracheal tube and central  catheter in good position. Enteric feeding tube coursing low diaphragm. Hazy bilateral lower lung opacification consistent with pleural effusions. PORT ABD X-RAY 8/12: IMPRESSION: 1. Nasogastric tube tip in the proximal stomach and side hole in the region of the gastroesophageal junction. 2. Curvilinear density in the inferior pelvis in the expected position of the urinary bladder, most likely representing a Foley catheter. A rectal tube is also a possibility. 3. Cardiomegaly and bilateral pleural effusions. PORT CXR 8/12:  Persistent bilateral lower lung hazy opacification consistent with pleural effusion. Endotracheal tube approximately 2 cm too high. Enteric feeding tube with side-port at gastroesophageal junction. RUQ U/S 8/13: IMPRESSION: 1. Biliary sludge and small gallstones within the gallbladder lumen. 2. No evidence of biliary obstruction. 3. Liver parenchyma mildly heterogeneous without evidence of significant steatosis or cirrhosis. A small amount of ascites is present as well as a right pleural effusion. PORT CXR 8/14: Endotracheal tube and central venous catheter in  good position. Enteric feeding tube coursing low diaphragm. Continue to hazy bilateral lower lung opacities consistent with pleural effusions. CT ABD/PELVIS W/O CONTRAST 8/14: IMPRESSION: 1. No explanation for bilious vomiting.  No bowel obstruction. 2. Findings consistent with fluid overload with body wall edema, third-spacing, small amount of abdominopelvic ascites and moderate to large bilateral pleural effusions. 3. Cortical nephrocalcinosis. 4. Gallstones/sludge. 5. Advanced aortic atherosclerosis.  MICROBIOLOGY: MRSA PCR 8/3:  Negative  Tracheal Aspirate Culture 8/8:  Klebsiella pneumoniae Tracheal Aspirate Culture 8/9:  Klebsiella pneumoniae  ANTIBIOTICS: Cefuroxime 8/8 (periop) Cefepime 8/9 - 8/11 Rocephin 8/11 - 8/12 Vancomycin 8/9 - 8/10; Vancomycin 8/13 >>  Cipro 8/13 >>  SIGNIFICANT EVENTS: 08/01 - Admit 08/08 - RLE Fem-Pop bypass & returned to ICU intubated 08/09 - On Neo-Synephrine w/ oliguria & increasing oxygen needs 08/12 - N/V overnight >> tube feedings on hold 08/13 - Changed from Rocephin to Cipro w/ increasing LFTs 08/15 - Vitamin K 1mg  IV for INR >4.0 08/16 - N/V, CT abd negative   LINES/TUBES: OETT 8/8 >>> 8/17 L  CVL 8/9 >>> R RADIAL  ART LINE 8/8 >>> out Foley >>>  OGT >>> 8/17 PIV  ASSESSMENT / PLAN:  PULMONARY A: Acute on chronic hypoxic respiratory failure: Multifactorial from pneumonia, pleural effusions, and pulmonary edema. Bilateral pleural effusions: Deferring thoracentesis given coagulopathy.    P:   Stable post extubation. Wean down Fio2 Incentive spirometer  CARDIOVASCULAR A:  Shock: Likely multifactorial but primarily sepsis. Improving. Atrial fibrillation Acute diastolic congestive heart failure Limb Ischemia:  S/P bypass.  P:  Continue lasix Amiodarone PO  Tele monitoring  Vascular surgery following   RENAL A:   Hypokalemia: Replaced. Hypomagnesemia: Replaced.  Acute on chronic renal failure: Slowly  improving. Metabolic acidosis:  Stable.   P:   Trend BMP / urinary output Replace electrolytes as indicated. Will need more potassium Follow BMP later today Avoid nephrotoxic agents, ensure adequate renal perfusion  GASTROINTESTINAL A:   Transaminitis: Likely secondary to shock liver. Improving.  Nausea with vomiting: Unclear etiology. Possibly secondary to transaminitis.   P:   SLP eval  Keep NPO  Continue D10  HEMATOLOGIC A:   Coagulopathy: improved, received vitamin K. Known history of chronic Coumadin. Polycythemia Vera Leukocytosis: Steadily improving. Likely secondary to sepsis. Anemia: Hemoglobin stable. No evidence of active bleeding.Marland Kitchen History of DVT  P:  Heparin gtt Trend CBC  Transfuse per ICU guidelines  INFECTIOUS A:   Sepsis Klebsiella pneumonia Ischemic right foot/cellulitis  P:   Continue Cipro, D9/10  Repeat cultures if new fever  Empiric vancomycin for LE cellulitis, D6/X. Monitor response to determine length of therapy needed.   ENDOCRINE A:   Hypothyroidism  P:   Continue synthroid   NEUROLOGIC A:   Mild delirium Pain control postoperatively  P:   Monitor  Prophylaxis:   Heparin drip per pharmacy consult. Protonix IV daily. Diet:  NPO. SLP evaluation pending.  Code Status:  Full Code. Disposition:  ICU Family Update: No family at bedside am 8/17   Marshell Garfinkel MD [facility] Pager [phone] If no answer or after 3pm call: [phone] 03/21/2017, 9:31 AM

## 2017-03-21 NOTE — Progress Notes (Signed)
PT Cancellation Note  Patient Details Name: Charlotte [name] MRN: [id number] DOB: Mar 16, 1941   Cancelled Treatment:    Reason Eval/Treat Not Completed: Medical issues which prohibited therapy.  Patient with Potassium at 2.1 today.  Will return tomorrow for PT evaluation.   Despina Pole 03/21/2017, 2:17 PM Carita Pian. Sanjuana Kava, [place] Pager [phone]

## 2017-03-21 NOTE — Progress Notes (Signed)
CRITICAL VALUE ALERT  Critical Value:  K 2.7   Date & Time Notied:  3:43 PM 03/21/2017   Provider Notified: E Link Dr. Tamala Julian   Orders Received/Actions taken: Charlotte [name] currently ordered KCL runs and recheck at 1800.

## 2017-03-21 NOTE — Progress Notes (Signed)
CRITICAL VALUE ALERT  Critical Value:   K  2.1  Date & Time Notied:  03/21/2017  0525   Provider Notified: Dr Lamonte Sakai  Orders Received/Actions taken: KCL 10 meg/50 cc IVPB x 6 to be given

## 2017-03-21 NOTE — Progress Notes (Signed)
eLink Physician-Brief Progress Note Patient Name: DEVRI KREHER DOB: 1940/10/03 MRN: [id number]   Date of Service  03/21/2017  HPI/Events of Note    eICU Interventions  KCL given. She will need further supplementation - hopefully will able to take PO later today      Intervention Category Intermediate Interventions: Electrolyte abnormality - evaluation and management  Dempsy Damiano S. 03/21/2017, 5:29 AM

## 2017-03-21 NOTE — Progress Notes (Signed)
03/21/2017 1000 Nursing note Dr. Vaughan Browner on floor and made aware IV KCL runs x 6 already administered this am per prior orders. Verbal confirmation received to give an additional 6 Runs IV KCL as ordered by Dr. Vaughan Browner. Orders enacted. Will continue to closely monitor patient.  Fredie Majano, [place] Lime

## 2017-03-21 NOTE — Progress Notes (Signed)
eLink Physician-Brief Progress Note Patient Name: EDITHE DOBBIN DOB: 07/21/1941 MRN: [id number]   Date of Service  03/21/2017  HPI/Events of Note    eICU Interventions  R LE pain. Pt NPO. Morphine ordered     Intervention Category Intermediate Interventions: Pain - evaluation and management  Debbrah Sampedro S. 03/21/2017, 12:04 AM

## 2017-03-21 NOTE — Progress Notes (Signed)
Modified Barium Swallow Progress Note  Patient Details  Name: Charlotte [name] MRN: [id number] Date of Birth: [birth year]  Today's Date: 03/21/2017  Modified Barium Swallow completed.  Full report located under Chart Review in the Imaging Section.  Brief recommendations include the following:  Clinical Impression  Patient presents with acute, reversible mild-moderate pharyngeal phase dysphagia s/p 9 day intubation. Oral stage of swallowing WFL. Pharyngeal stage characterized by mildly reduced tongue base retraction, adequate hyolaryngeal excursion and pharyngeal constriction. Pt with impaired pharyngeal sensation leading to delayed swallow initiation to pyriform sinuses with thin, nectar thick liquids. This resulted in intermittent penetration, trace silent aspiration before the swallow with multiple and single sips. No penetration/aspiration with honey-thick liquids or solids. Pt is hard of hearing and had difficulty following commands for cough/reswallow, chin tuck. Mild coating of contrast remains (valleculae, pyriform and posterior pharynx), pattern suggestive of secretions in pharynx. With regular solid and barium pill (pt masticated despite cue to swallow whole), pt had moderate residue contained in the valleculae, which reduced/cleared with liquid wash + chin tuck. Recommend initiating dys 3 diet with honey-thick liquids, medications crushed in puree, alternate solids and liquids. SLP will f/u for tolerance, repeat instrumental assessment and advancement as appropriate.   Swallow Evaluation Recommendations       SLP Diet Recommendations: Dysphagia 3 (Mech soft) solids;Honey thick liquids   Liquid Administration via: Cup   Medication Administration: Crushed with puree   Supervision: Patient able to self feed;Intermittent supervision to cue for compensatory strategies   Compensations: Slow rate;Small sips/bites;Follow solids with liquid   Postural Changes: Seated upright at 90  degrees   Oral Care Recommendations: Oral care BID   Other Recommendations: Order thickener from pharmacy;Prohibited food (jello, ice cream, thin soups);Remove water pitcher;Have oral suction available  Charlotte Lever, MS, CCC-SLP Speech-Language Pathologist [phone]  Charlotte [name] 03/21/2017,2:21 PM

## 2017-03-21 NOTE — Progress Notes (Addendum)
ANTICOAGULATION CONSULT NOTE - Follow Up Consult  Pharmacy Consult for Heparin  Indication: atrial fibrillation  No Known Allergies  Patient Measurements: Height: 6' (182.9 cm) Weight: 179 lb 0.2 oz (81.2 kg) IBW/kg (Calculated) : 73.1  Vital Signs: Temp: 97.7 F (36.5 C) (08/18 1251) Temp Source: Oral (08/18 1251) BP: 87/55 (08/18 1300) Pulse Rate: 86 (08/18 1300)  Labs:  Recent Labs  03/19/17 0422  03/19/17 1859 03/20/17 0432 03/20/17 0610 03/20/17 1524 03/21/17 0356 03/21/17 1216  HGB 10.8*  --   --  10.1*  --   --   --  11.0*  HCT 35.9*  --   --  33.0*  --   --   --  36.6  PLT 387  --   --  194  --   --   --  136*  LABPROT 17.5*  --   --   --  19.7*  --  21.7*  --   INR 1.42  --   --   --  1.64  --  1.86  --   HEPARINUNFRC  --   < >  --   --  0.23* 0.49 0.54  --   CREATININE 1.79*  --  1.73* 1.57*  --   --  1.28*  --   < > = values in this interval not displayed.  Estimated Creatinine Clearance: 44.5 mL/min (A) (by C-G formula based on SCr of 1.28 mg/dL (H)).   . ciprofloxacin 400 mg (03/21/17 1351)  . dextrose 50 mL/hr at 03/21/17 1300  . furosemide Stopped (03/21/17 1045)  . heparin 1,750 Units/hr (03/21/17 1300)  . norepinephrine (LEVOPHED) Adult infusion Stopped (03/20/17 1050)  . potassium chloride 10 mEq (03/21/17 1350)    Assessment: Heparin being used for AFib. Heparin level at goal this morning. No signs or symptoms of bleeding noted in the chart. H/H and platelets remain stable, today's CBC stable.   Goal of Therapy: Heparin level 0.3-0.7 units/ml Monitor platelets by anticoagulation protocol: Yes   Plan:  -Continue heparin at 1750 units/hr -Monitor daily heparin levels, CBC  Uvaldo Rising, BCPS  Clinical Pharmacist Pager [phone]  03/21/2017 1:55 PM

## 2017-03-21 NOTE — Evaluation (Signed)
Clinical/Bedside Swallow Evaluation Patient Details  Name: Charlotte [name] MRN: [id number] Date of Birth: Apr 02, 1941  Today's Date: 03/21/2017 Time: SLP Start Time (ACUTE ONLY): 0900 SLP Stop Time (ACUTE ONLY): 0911 SLP Time Calculation (min) (ACUTE ONLY): 11 min  Past Medical History:  Past Medical History:  Diagnosis Date  . Arthritis   . Atrial fibrillation, chronic (HCC)    a. on Coumadin for anticoagulation.   . CHF (congestive heart failure) ([place])   . Deaf   . Diastolic heart failure ([place])   . DVT (deep venous thrombosis) ([place])   . Essential hypertension   . Hyperlipidemia   . Hypothyroidism   . On home O2   . Peripheral neuropathy   . Peripheral vascular disease ([place])   . 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 Ulcer of ankle ([place])    Bilateral 2015  . Varicose veins    Past Surgical History:  Past Surgical History:  Procedure Laterality Date  . ABDOMINAL AORTAGRAM N/A 09/14/2012   Procedure: ABDOMINAL [name];  Surgeon: Serafina Mitchell, MD;  Location: [facility] CATH LAB;  Service: Cardiovascular;  Laterality: N/A;  . ABDOMINAL AORTOGRAM N/A 03/06/2017   Procedure: ABDOMINAL AORTOGRAM;  Surgeon: Elam Dutch, MD;  Location: [place] CV LAB;  Service: Cardiovascular;  Laterality: N/A;  . ABDOMINAL HYSTERECTOMY    . CORONARY ANGIOPLASTY    . DRESSING CHANGE UNDER ANESTHESIA  12/02/2011   Procedure: DRESSING CHANGE UNDER ANESTHESIA;  Surgeon: Carole Civil, MD;  Location: [facility];  Service: Orthopedics;  Laterality: Right;  . ENDARTERECTOMY FEMORAL Right 09/15/2012   Procedure: ENDARTERECTOMY FEMORAL;  Surgeon: Mal Misty, MD;  Location: [place];  Service: Vascular;  Laterality: Right;  . FASCIOTOMY  11/29/2011   Procedure: FASCIOTOMY;  Surgeon: Carole Civil, MD;  Location: [facility];  Service: Orthopedics;  Laterality: Right;  right thigh   . FEMORAL-POPLITEAL BYPASS GRAFT Right 09/15/2012   Procedure: BYPASS GRAFT FEMORAL-POPLITEAL ARTERY;  Surgeon: Mal Misty, MD;  Location: [place];  Service: Vascular;  Laterality: Right;  . FEMORAL-TIBIAL BYPASS GRAFT Right 03/11/2017   Procedure: RIGHT FEMORAL-TIBIAL BYPASS IN-SITU GREATER SAPHENOUS VEIN;  Surgeon: Waynetta Sandy, MD;  Location: [place];  Service: Vascular;  Laterality: Right;  . HIP PINNING,CANNULATED  11/19/2011   Procedure: CANNULATED HIP PINNING;  Surgeon: Sanjuana Kava, MD;  Location: [facility];  Service: Orthopedics;  Laterality: Right;  . LOWER EXTREMITY ANGIOGRAPHY Bilateral 03/06/2017   Procedure: Lower Extremity Angiography;  Surgeon: Elam Dutch, MD;  Location: [place] CV LAB;  Service: Cardiovascular;  Laterality: Bilateral;  . PATCH ANGIOPLASTY Right 09/15/2012   Procedure: PATCH ANGIOPLASTY;  Surgeon: Mal Misty, MD;  Location: [place];  Service: Vascular;  Laterality: Right;  . TUBAL LIGATION     HPI:  76 year old female admitted 03/04/17 with right leg pain and discoloration, found to have pneumonia, septic shock. PMh significant for AFib, diastolic heart failure, HTN, HOH, PVD, polycysthemia. Pt underwent R fem to tp trunk bypass 03/11/17. She was intubated from 8/8-8/17. Chest x-ray 8/18 shows Lung opacities consistent with a combination of bilateral pleural effusions with pulmonary edema, atelectasis or a combination. Perihilar lung opacity appears mildly increased from the most recent prior study suggesting worsened pulmonary edema.   Assessment / Plan / Recommendation Clinical Impression  Patient presents with severe risk for aspiration s/p 9 day intubation in the setting of decreased respiratory status, current pneumonia and deconditioning. She is alert and oriented, following commands. Her voice is  clear, cough strong. Initially tolerates ice chips, cup sips of thin liquids with no overt signs of aspiration, however began to display delayed throat clearing, coughing with continued trials, suggestive of reduced airway protection. Given pt's risks and her current  pneumonia, recommend proceeding with instrumental testing (MBS) prior to diet initiation in order to determine safest, least restrictive diet. Pt/family educated re: MBS and are in agreement with this plan. Will proceed with MBS this date. SLP Visit Diagnosis: Dysphagia, oropharyngeal phase (R13.12)    Aspiration Risk  Severe aspiration risk;Risk for inadequate nutrition/hydration    Diet Recommendation NPO        Other  Recommendations Oral Care Recommendations: Oral care QID Other Recommendations: Remove water pitcher;Have oral suction available   Follow up Recommendations Other (comment) (TBD)      Frequency and Duration            Prognosis Prognosis for Safe Diet Advancement: Good      Swallow Study   General Date of Onset: 03/04/17 HPI: 76 year old female admitted 03/04/17 with right leg pain and discoloration, found to have pneumonia, septic shock. PMh significant for AFib, diastolic heart failure, HTN, HOH, PVD, polycysthemia. Pt underwent R fem to tp trunk bypass 03/11/17. She was intubated from 8/8-8/17. Chest x-ray 8/18 shows Lung opacities consistent with a combination of bilateral pleural effusions with pulmonary edema, atelectasis or a combination. Perihilar lung opacity appears mildly increased from the most recent prior study suggesting worsened pulmonary edema. Type of Study: Bedside Swallow Evaluation Previous Swallow Assessment: BSE 02/13/17 recommended regular diet with thin liquids, ENT referral to further address swollen lymph gland on the R side of her neck  Diet Prior to this Study: NPO Temperature Spikes Noted: No Respiratory Status: Nasal cannula History of Recent Intubation: Yes Length of Intubations (days): 9 days Date extubated: 03/20/17 Behavior/Cognition: Alert;Cooperative;Pleasant mood Oral Cavity Assessment: Within Functional Limits Oral Care Completed by SLP: Yes Oral Cavity - Dentition: Dentures, top;Dentures, bottom Vision: Functional for  self-feeding Self-Feeding Abilities: Able to feed self Patient Positioning: Upright in bed Baseline Vocal Quality: Normal Volitional Cough: Strong Volitional Swallow: Able to elicit    Oral/Motor/Sensory Function Overall Oral Motor/Sensory Function: Within functional limits   Ice Chips Ice chips: Within functional limits Presentation: Spoon   Thin Liquid Thin Liquid: Impaired Presentation: Spoon;Straw;Cup;Self Fed Pharyngeal  Phase Impairments: Throat Clearing - Delayed;Cough - Delayed    Nectar Thick Nectar Thick Liquid: Not tested   Honey Thick Honey Thick Liquid: Not tested   Puree Puree: Not tested   Solid   GO   Solid: Not tested       Deneise Lever, MS, CCC-SLP Speech-Language Pathologist [phone]  Aliene Altes 03/21/2017,9:20 AM

## 2017-03-21 NOTE — Progress Notes (Signed)
Subjective  - POD #10  extubated No complaints of pain Leg feels better  Physical Exam:  Incisions healing appropriately Dusky right 4&5 toes with small blister on dorsum of foot Good pedal doppler signals Foot drop splint in place   Assessment/Plan:  POD #10  Needs PT/OT consult for mobilization Continue ASA and heparin Advance diet Would benefit from medicine consult once ready to leave ICU  Ronny Korff, [place] 03/21/2017 11:05 AM --  Vitals:   03/21/17 0900 03/21/17 1000  BP: 103/71 112/80  Pulse: 99 (!) 106  Resp: 20 15  Temp:    SpO2: 100% 94%    Intake/Output Summary (Last 24 hours) at 03/21/17 1105 Last data filed at 03/21/17 1005  Gross per 24 hour  Intake           1547.5 ml  Output             5075 ml  Net          -3527.5 ml     Laboratory CBC    Component Value Date/Time   WBC 22.5 (H) 03/20/2017 0432   HGB 10.1 (L) 03/20/2017 0432   HCT 33.0 (L) 03/20/2017 0432   PLT 194 03/20/2017 0432    BMET    Component Value Date/Time   NA 143 03/21/2017 0356   K 2.1 (LL) 03/21/2017 0356   CL 106 03/21/2017 0356   CO2 29 03/21/2017 0356   GLUCOSE 133 (H) 03/21/2017 0356   BUN 58 (H) 03/21/2017 0356   CREATININE 1.28 (H) 03/21/2017 0356   CREATININE 1.21 (H) 08/14/2016 0733   CALCIUM 8.2 (L) 03/21/2017 0356   GFRNONAA 40 (L) 03/21/2017 0356   GFRAA 47 (L) 03/21/2017 0356    COAG Lab Results  Component Value Date   INR 1.86 03/21/2017   INR 1.64 03/20/2017   INR 1.42 03/19/2017   No results found for: PTT  Antibiotics Anti-infectives    Start     Dose/Rate Route Frequency Ordered Stop   03/21/17 0700  vancomycin (VANCOCIN) IVPB 750 mg/150 ml premix     750 mg 150 mL/hr over 60 Minutes Intravenous  Once 03/21/17 0656 03/21/17 0845   03/17/17 1100  vancomycin (VANCOCIN) IVPB 750 mg/150 ml premix  Status:  Discontinued     750 mg 150 mL/hr over 60 Minutes Intravenous Every 24 hours 03/16/17 0929 03/19/17 1147   03/16/17 1000   ciprofloxacin (CIPRO) IVPB 400 mg     400 mg 200 mL/hr over 60 Minutes Intravenous Every 24 hours 03/16/17 0858     03/16/17 1000  vancomycin (VANCOCIN) 2,000 mg in sodium chloride 0.9 % 500 mL IVPB     2,000 mg 250 mL/hr over 120 Minutes Intravenous  Once 03/16/17 0929 03/16/17 1250   03/15/17 1000  cefTRIAXone (ROCEPHIN) 1 g in dextrose 5 % 50 mL IVPB  Status:  Discontinued     1 g 100 mL/hr over 30 Minutes Intravenous Every 24 hours 03/14/17 1054 03/16/17 0841   03/14/17 1100  cefTRIAXone (ROCEPHIN) 1 g in dextrose 5 % 50 mL IVPB  Status:  Discontinued     1 g 100 mL/hr over 30 Minutes Intravenous Every 24 hours 03/14/17 1054 03/14/17 1054   03/13/17 1100  vancomycin (VANCOCIN) 1,250 mg in sodium chloride 0.9 % 250 mL IVPB  Status:  Discontinued     1,250 mg 166.7 mL/hr over 90 Minutes Intravenous Every 24 hours 03/12/17 0937 03/13/17 0741   03/13/17 1100  vancomycin (VANCOCIN) IVPB 1000  mg/200 mL premix  Status:  Discontinued     1,000 mg 200 mL/hr over 60 Minutes Intravenous Every 24 hours 03/13/17 0741 03/14/17 1053   03/12/17 1000  ceFEPIme (MAXIPIME) 2 g in dextrose 5 % 50 mL IVPB  Status:  Discontinued     2 g 100 mL/hr over 30 Minutes Intravenous Every 24 hours 03/12/17 0931 03/14/17 1053   03/12/17 1000  vancomycin (VANCOCIN) 1,750 mg in sodium chloride 0.9 % 500 mL IVPB     1,750 mg 250 mL/hr over 120 Minutes Intravenous  Once 03/12/17 0931 03/12/17 1210   03/11/17 2000  cefUROXime (ZINACEF) 1.5 g in dextrose 5 % 50 mL IVPB     1.5 g 100 mL/hr over 30 Minutes Intravenous Every 12 hours 03/11/17 1955 03/12/17 0930   03/11/17 1245  cefUROXime (ZINACEF) 1.5 g in dextrose 5 % 50 mL IVPB     1.5 g 100 mL/hr over 30 Minutes Intravenous On call to O.R. 03/11/17 1244 03/11/17 1500       V. Leia Alf, M.D. [facility] Office: [phone] Pager:  [phone]

## 2017-03-22 ENCOUNTER — Inpatient Hospital Stay (HOSPITAL_COMMUNITY): Payer: Medicare Other

## 2017-03-22 LAB — BASIC METABOLIC PANEL
ANION GAP: 5 (ref 5–15)
BUN: 45 mg/dL — ABNORMAL HIGH (ref 6–20)
CHLORIDE: 106 mmol/L (ref 101–111)
CO2: 28 mmol/L (ref 22–32)
Calcium: 8 mg/dL — ABNORMAL LOW (ref 8.9–10.3)
Creatinine, Ser: 1.2 mg/dL — ABNORMAL HIGH (ref 0.44–1.00)
GFR calc Af Amer: 50 mL/min — ABNORMAL LOW (ref >60)
GFR, EST NON AFRICAN AMERICAN: 43 mL/min — AB (ref >60)
Glucose, Bld: 89 mg/dL (ref 65–99)
POTASSIUM: 3 mmol/L — AB (ref 3.5–5.1)
SODIUM: 139 mmol/L (ref 135–145)

## 2017-03-22 LAB — GLUCOSE, CAPILLARY
GLUCOSE-CAPILLARY: 99 mg/dL (ref 65–99)
GLUCOSE-CAPILLARY: 40 mg/dL — AB (ref 65–99)
GLUCOSE-CAPILLARY: 58 mg/dL — AB (ref 65–99)
GLUCOSE-CAPILLARY: 66 mg/dL (ref 65–99)
GLUCOSE-CAPILLARY: 88 mg/dL (ref 65–99)
GLUCOSE-CAPILLARY: 71 mg/dL (ref 65–99)
GLUCOSE-CAPILLARY: 81 mg/dL (ref 65–99)
Glucose-Capillary: 62 mg/dL — ABNORMAL LOW (ref 65–99)
Glucose-Capillary: 97 mg/dL (ref 65–99)
Glucose-Capillary: 106 mg/dL — ABNORMAL HIGH (ref 65–99)

## 2017-03-22 LAB — MAGNESIUM: Magnesium: 1.6 mg/dL — ABNORMAL LOW (ref 1.7–2.4)

## 2017-03-22 LAB — PROTIME-INR
INR: 2.23
PROTHROMBIN TIME: 25.1 s — AB (ref 11.4–15.2)

## 2017-03-22 LAB — CBC
HEMATOCRIT: 31 % — AB (ref 36.0–46.0)
HEMATOCRIT: 32.3 % — AB (ref 36.0–46.0)
HEMOGLOBIN: 9.2 g/dL — AB (ref 12.0–15.0)
Hemoglobin: 9.5 g/dL — ABNORMAL LOW (ref 12.0–15.0)
MCH: 23 pg — AB (ref 26.0–34.0)
MCH: 23.1 pg — ABNORMAL LOW (ref 26.0–34.0)
MCHC: 29.7 g/dL — ABNORMAL LOW (ref 30.0–36.0)
MCHC: 29.4 g/dL — AB (ref 30.0–36.0)
MCV: 77.5 fL — ABNORMAL LOW (ref 78.0–100.0)
MCV: 78.4 fL (ref 78.0–100.0)
PLATELETS: 133 10*3/uL — AB (ref 150–400)
PLATELETS: 142 10*3/uL — AB (ref 150–400)
RBC: 4 MIL/uL (ref 3.87–5.11)
RBC: 4.12 MIL/uL (ref 3.87–5.11)
RDW: 26 % — ABNORMAL HIGH (ref 11.5–15.5)
RDW: 26.8 % — AB (ref 11.5–15.5)
WBC: 16.7 10*3/uL — ABNORMAL HIGH (ref 4.0–10.5)
WBC: 23.8 10*3/uL — AB (ref 4.0–10.5)

## 2017-03-22 LAB — HEPARIN LEVEL (UNFRACTIONATED): HEPARIN UNFRACTIONATED: 0.5 [IU]/mL (ref 0.30–0.70)

## 2017-03-22 LAB — PHOSPHORUS: Phosphorus: 2.5 mg/dL (ref 2.5–4.6)

## 2017-03-22 LAB — PROCALCITONIN: PROCALCITONIN: 0.23 ng/mL

## 2017-03-22 MED ORDER — POTASSIUM CHLORIDE ER 10 MEQ PO TBCR
40.0000 meq | EXTENDED_RELEASE_TABLET | Freq: Once | ORAL | Status: AC
Start: 2017-03-22 — End: 2017-03-22
  Administered 2017-03-22: 40 meq via ORAL
  Filled 2017-03-22: qty 4

## 2017-03-22 MED ORDER — POTASSIUM CHLORIDE 20 MEQ PO PACK
40.0000 meq | PACK | Freq: Once | ORAL | Status: DC
  Filled 2017-03-22: qty 2

## 2017-03-22 MED ORDER — DEXTROSE 50 % IV SOLN
INTRAVENOUS | Status: AC
Start: 2017-03-22 — End: 2017-03-22
  Administered 2017-03-22: 50 mL
  Filled 2017-03-22: qty 50

## 2017-03-22 MED ORDER — AMIODARONE HCL IN DEXTROSE 360-4.14 MG/200ML-% IV SOLN
30.0000 mg/h | INTRAVENOUS | Status: DC
  Administered 2017-03-23 – 2017-03-24 (×2): 30 mg/h via INTRAVENOUS
  Filled 2017-03-22 (×3): qty 200

## 2017-03-22 MED ORDER — POTASSIUM CHLORIDE ER 10 MEQ PO TBCR
40.0000 meq | EXTENDED_RELEASE_TABLET | Freq: Two times a day (BID) | ORAL | Status: DC
  Filled 2017-03-22: qty 4

## 2017-03-22 MED ORDER — SODIUM CHLORIDE 0.9 % IV SOLN
INTRAVENOUS | Status: AC
  Administered 2017-03-22: 22:00:00 via INTRAVENOUS

## 2017-03-22 MED ORDER — AMIODARONE LOAD VIA INFUSION
150.0000 mg | Freq: Once | INTRAVENOUS | Status: AC
  Administered 2017-03-22: 150 mg via INTRAVENOUS
  Filled 2017-03-22: qty 83.34

## 2017-03-22 MED ORDER — SENNOSIDES-DOCUSATE SODIUM 8.6-50 MG PO TABS: 1.0000 | ORAL_TABLET | Freq: Every evening | ORAL | Status: DC | PRN

## 2017-03-22 MED ORDER — POTASSIUM CHLORIDE 10 MEQ/50ML IV SOLN
10.0000 meq | INTRAVENOUS | Status: AC
  Administered 2017-03-22 (×4): 10 meq via INTRAVENOUS
  Filled 2017-03-22 (×4): qty 50

## 2017-03-22 MED ORDER — DEXTROSE 50 % IV SOLN
INTRAVENOUS | Status: AC
  Filled 2017-03-22: qty 50

## 2017-03-22 MED ORDER — VANCOMYCIN HCL IN DEXTROSE 750-5 MG/150ML-% IV SOLN
750.0000 mg | Freq: Every day | INTRAVENOUS | Status: DC
  Administered 2017-03-23 (×2): 750 mg via INTRAVENOUS
  Filled 2017-03-22 (×2): qty 150

## 2017-03-22 MED ORDER — ACETAMINOPHEN 325 MG PO TABS
650.0000 mg | ORAL_TABLET | Freq: Four times a day (QID) | ORAL | Status: DC | PRN
  Administered 2017-03-25 – 2017-03-27 (×5): 650 mg via ORAL
  Filled 2017-03-22 (×6): qty 2

## 2017-03-22 MED ORDER — GERHARDT'S BUTT CREAM
TOPICAL_CREAM | Freq: Every day | CUTANEOUS | Status: DC
  Administered 2017-03-22: 1 via TOPICAL
  Administered 2017-03-23 – 2017-03-24 (×2): via TOPICAL
  Administered 2017-03-25: 1 via TOPICAL
  Administered 2017-03-26: 12:00:00 via TOPICAL
  Administered 2017-03-27: 1 via TOPICAL
  Administered 2017-03-28: 10:00:00 via TOPICAL
  Administered 2017-03-29: 1 via TOPICAL
  Filled 2017-03-22 (×2): qty 1

## 2017-03-22 MED ORDER — POTASSIUM CHLORIDE 20 MEQ/15ML (10%) PO SOLN
40.0000 meq | Freq: Two times a day (BID) | ORAL | Status: DC
  Administered 2017-03-22: 40 meq via ORAL
  Filled 2017-03-22: qty 30

## 2017-03-22 MED ORDER — AMIODARONE HCL IN DEXTROSE 360-4.14 MG/200ML-% IV SOLN
60.0000 mg/h | INTRAVENOUS | Status: AC
  Administered 2017-03-22 (×2): 60 mg/h via INTRAVENOUS
  Filled 2017-03-22 (×2): qty 200

## 2017-03-22 MED ORDER — MAGNESIUM SULFATE 2 GM/50ML IV SOLN
2.0000 g | Freq: Once | INTRAVENOUS | Status: AC
  Administered 2017-03-22: 2 g via INTRAVENOUS
  Filled 2017-03-22: qty 50

## 2017-03-22 MED ORDER — FUROSEMIDE 40 MG PO TABS
40.0000 mg | ORAL_TABLET | Freq: Every day | ORAL | Status: DC
  Administered 2017-03-22 – 2017-03-23 (×2): 40 mg via ORAL
  Filled 2017-03-22 (×2): qty 1

## 2017-03-22 NOTE — Progress Notes (Signed)
Hypoglycemic Event  CBG: 58,40  Treatment: 67ml Dextrose  Symptoms: asymptomatic  Follow-up CBG: 66 Time: 0811   Possible Reasons for Event:   Comments/MD notified:at 0165 patient began eating breakfast re-assessed blood sugar at 0833 was up to 88.  Dr. Carolin Sicks updated in person.    Teodoro Spray

## 2017-03-22 NOTE — Progress Notes (Signed)
Subjective  - POD #11  Transferred to 2C   Physical Exam:  Continued demarcation of tips of toes on right Good pedal doppler signals (AT) Incisions intact with staples, monitor medial thigh       Assessment/Plan:  POD #11  Continue heparin for AFIB Hypokalemia: improved with replacement PT/OT  ST:  MBS completed.  Dysphagia 3 diet recommended  Nozomi Mettler, [place] 03/22/2017 10:57 AM --  Vitals:   03/22/17 0733 03/22/17 1027  BP: (!) 83/62 90/60  Pulse: 70   Resp: (!) 22 (!) 26  Temp: 98.1 F (36.7 C)   SpO2: 93% 95%    Intake/Output Summary (Last 24 hours) at 03/22/17 1057 Last data filed at 03/22/17 1021  Gross per 24 hour  Intake           2612.5 ml  Output             1620 ml  Net            992.5 ml     Laboratory CBC    Component Value Date/Time   WBC 16.7 (H) 03/22/2017 0500   HGB 9.2 (L) 03/22/2017 0500   HCT 31.0 (L) 03/22/2017 0500   PLT 133 (L) 03/22/2017 0500    BMET    Component Value Date/Time   NA 139 03/22/2017 0500   K 3.0 (L) 03/22/2017 0500   CL 106 03/22/2017 0500   CO2 28 03/22/2017 0500   GLUCOSE 89 03/22/2017 0500   BUN 45 (H) 03/22/2017 0500   CREATININE 1.20 (H) 03/22/2017 0500   CREATININE 1.21 (H) 08/14/2016 0733   CALCIUM 8.0 (L) 03/22/2017 0500   GFRNONAA 43 (L) 03/22/2017 0500   GFRAA 50 (L) 03/22/2017 0500    COAG Lab Results  Component Value Date   INR 2.23 03/22/2017   INR 1.86 03/21/2017   INR 1.64 03/20/2017   No results found for: PTT  Antibiotics Anti-infectives    Start     Dose/Rate Route Frequency Ordered Stop   03/22/17 0200  ciprofloxacin (CIPRO) IVPB 400 mg  Status:  Discontinued     400 mg 200 mL/hr over 60 Minutes Intravenous Every 12 hours 03/21/17 1529 03/22/17 0930   03/21/17 0700  vancomycin (VANCOCIN) IVPB 750 mg/150 ml premix     750 mg 150 mL/hr over 60 Minutes Intravenous  Once 03/21/17 0656 03/21/17 0845   03/17/17 1100  vancomycin (VANCOCIN) IVPB 750 mg/150 ml premix   Status:  Discontinued     750 mg 150 mL/hr over 60 Minutes Intravenous Every 24 hours 03/16/17 0929 03/19/17 1147   03/16/17 1000  ciprofloxacin (CIPRO) IVPB 400 mg  Status:  Discontinued     400 mg 200 mL/hr over 60 Minutes Intravenous Every 24 hours 03/16/17 0858 03/21/17 1529   03/16/17 1000  vancomycin (VANCOCIN) 2,000 mg in sodium chloride 0.9 % 500 mL IVPB     2,000 mg 250 mL/hr over 120 Minutes Intravenous  Once 03/16/17 0929 03/16/17 1250   03/15/17 1000  cefTRIAXone (ROCEPHIN) 1 g in dextrose 5 % 50 mL IVPB  Status:  Discontinued     1 g 100 mL/hr over 30 Minutes Intravenous Every 24 hours 03/14/17 1054 03/16/17 0841   03/14/17 1100  cefTRIAXone (ROCEPHIN) 1 g in dextrose 5 % 50 mL IVPB  Status:  Discontinued     1 g 100 mL/hr over 30 Minutes Intravenous Every 24 hours 03/14/17 1054 03/14/17 1054   03/13/17 1100  vancomycin (VANCOCIN) 1,250 mg in  sodium chloride 0.9 % 250 mL IVPB  Status:  Discontinued     1,250 mg 166.7 mL/hr over 90 Minutes Intravenous Every 24 hours 03/12/17 0937 03/13/17 0741   03/13/17 1100  vancomycin (VANCOCIN) IVPB 1000 mg/200 mL premix  Status:  Discontinued     1,000 mg 200 mL/hr over 60 Minutes Intravenous Every 24 hours 03/13/17 0741 03/14/17 1053   03/12/17 1000  ceFEPIme (MAXIPIME) 2 g in dextrose 5 % 50 mL IVPB  Status:  Discontinued     2 g 100 mL/hr over 30 Minutes Intravenous Every 24 hours 03/12/17 0931 03/14/17 1053   03/12/17 1000  vancomycin (VANCOCIN) 1,750 mg in sodium chloride 0.9 % 500 mL IVPB     1,750 mg 250 mL/hr over 120 Minutes Intravenous  Once 03/12/17 0931 03/12/17 1210   03/11/17 2000  cefUROXime (ZINACEF) 1.5 g in dextrose 5 % 50 mL IVPB     1.5 g 100 mL/hr over 30 Minutes Intravenous Every 12 hours 03/11/17 1955 03/12/17 0930   03/11/17 1245  cefUROXime (ZINACEF) 1.5 g in dextrose 5 % 50 mL IVPB     1.5 g 100 mL/hr over 30 Minutes Intravenous On call to O.R. 03/11/17 1244 03/11/17 1500       V. Leia Alf,  M.D. [facility] Office: [phone] Pager:  [phone]

## 2017-03-22 NOTE — Progress Notes (Signed)
OT Cancellation Note  Patient Details Name: Charlotte [name] MRN: [id number] DOB: 1940-08-24   Cancelled Treatment:    Reason Eval/Treat Not Completed: Patient not medically ready. Per RN, pt not medically appropriate for therapy at this time. Will follow up for OT eval as time allows.  Binnie Kand M.S., OTR/L Pager: [phone]  03/22/2017, 8:44 AM

## 2017-03-22 NOTE — Progress Notes (Addendum)
Called about HR sustaining in 140s. BP continues to be soft. Stop PO amio. Start amio drip and bolus.  Marshell Garfinkel MD  Pulmonary and Critical Care Pager [phone] If no answer or after 3pm call: [phone] 03/22/2017, 12:57 PM

## 2017-03-22 NOTE — Progress Notes (Addendum)
PULMONARY / CRITICAL CARE MEDICINE   Name: Charlotte [name] MRN: [id number] DOB: January 31, 1941    ADMISSION DATE:  03/04/2017   CONSULTATION DATE:  03/11/2017  REFERRING MD:  Windell Moulding  CHIEF COMPLAINT:  Leg pain  BRIEF HPI:  76 y.o.   female with known atrial fibrillation, polycythemia, and peripheral vascular disease. Patient also has known congestive heart failure and is on chronic oxygen therapy. Admitted on 8/1 with limb ischemia and subsequently pain. Underwent bypass on 8/8 and return to the intensive care unit intubated.  SUBJECTIVE:    Transferred to SDU yesterday Lasix held for low BP Cleared to eat by speech eval.  Hypoglycemia today AM > improved  VITAL SIGNS: BP (!) 83/62 (BP Location: Right Arm)   Pulse 70   Temp 98.1 F (36.7 C) (Oral)   Resp (!) 22   Ht 6' (1.829 m)   Wt 81.2 kg (179 lb 0.2 oz)   SpO2 93%   BMI 24.28 kg/m   HEMODYNAMICS:    VENTILATOR SETTINGS:    INTAKE / OUTPUT: I/O last 3 completed shifts: In: 3392.5 [P.O.:440; I.V.:2152.5; IV Piggyback:800] Out: 8185 [Urine:4570; Stool:100]  PHYSICAL EXAMINATION: Gen:      No acute distress HEENT:  EOMI, sclera anicteric Neck:     No masses; no thyromegaly Lungs:    Clear to auscultation bilaterally; normal respiratory effort CV:         Regular rate and rhythm; no murmurs Abd:      + bowel sounds; soft, non-tender; no palpable masses, no distension Ext:    No edema; adequate peripheral perfusion, RLE is clean, erythema is stable Skin:      Warm and dry; no rash Neuro: alert and oriented x 3 Psych: normal mood and affect  LABS:  BMET  Recent Labs Lab 03/21/17 1451 03/21/17 2000 03/22/17 0500  NA 140 139 139  K 2.7* 3.8 3.0*  CL 104 103 106  CO2 26 26 28   BUN 49* 48* 45*  CREATININE 1.34* 1.23* 1.20*  GLUCOSE 148* 76 89    Electrolytes  Recent Labs Lab 03/20/17 0432 03/21/17 0356 03/21/17 1451 03/21/17 2000 03/22/17 0500  CALCIUM 8.0* 8.2* 8.1* 8.1* 8.0*  MG 1.9 1.7   --   --  1.6*  PHOS 4.0 2.9  --   --  2.5    CBC  Recent Labs Lab 03/20/17 0432 03/21/17 1216 03/22/17 0500  WBC 22.5* 21.2* 16.7*  HGB 10.1* 11.0* 9.2*  HCT 33.0* 36.6 31.0*  PLT 194 136* 133*    Coag's  Recent Labs Lab 03/20/17 0610 03/21/17 0356 03/22/17 0500  INR 1.64 1.86 2.23    Sepsis Markers No results for input(s): LATICACIDVEN, PROCALCITON, O2SATVEN in the last 168 hours.  ABG  Recent Labs Lab 03/16/17 1031  PHART 7.438  PCO2ART 24.1*  PO2ART 125.0*    Liver Enzymes  Recent Labs Lab 03/18/17 0752 03/19/17 0422 03/20/17 0432  AST 47* 50* 74*  ALT 180* 134* 85*  ALKPHOS 57 67 73  BILITOT 1.2 1.6* 1.9*  ALBUMIN 1.9* 2.1* 1.9*    Cardiac Enzymes No results for input(s): TROPONINI, PROBNP in the last 168 hours.  Glucose  Recent Labs Lab 03/21/17 2353 03/22/17 0345 03/22/17 0738 03/22/17 0739 03/22/17 0811 03/22/17 0833  GLUCAP 73 99 58* 40* 66 88    Imaging Dg Chest Port 1 View  Result Date: 03/15/2017 CLINICAL DATA:  Acute respiratory failure. EXAM: PORTABLE CHEST 1 VIEW COMPARISON:  One-view chest x-ray the one-view  chest x-ray 03/14/2017 FINDINGS: Endotracheal tube is stable, 5 cm above the carina. Side port of the NG tube is just above the GE junction and could be advanced for more optimal positioning. A left subclavian line is stable. Cardiac enlargement, mild edema, and bilateral effusions are again noted. Bibasilar airspace disease is present, likely reflecting atelectasis. There is no significant interval change. IMPRESSION: 1. Stable cardiomegaly, mild edema, and pleural effusions compatible with congestive heart failure. 2. Support apparatus is stable. 3. The side port of the NG tube is likely just above the GE junction and could be advanced 6-10 cm for more optimal positioning. Electronically Signed   By: San Morelle M.D.   On: 03/15/2017 07:25     IMAGING/STUDIES: PFT 08/30/13: FVC 3.57 L (95%) FEV1 2.38 L (83%)  FEV1/FVC 0.66 FEF 25-75 1.34 L (60%) negative bronchodilator response TLC 101% RV 105% DLCO uncorrected 34% RENAL U/S 8/6:   IMPRESSION: 1. No evidence of hydronephrosis. 2. Mildly increased renal parenchymal echogenicity may reflect medical renal disease. TTE 8/10:  LV normal in size with EF 65-70%. No regional wall motion abnormalities. Unable to assess diastolic function due to atrial fibrillation. LA & RA severely dilated. RV moderately dilated with moderate to severely reduced systolic function. No aortic stenosis or regurgitation. Aortic root normal in size. Mild mitral regurgitation without stenosis. Ventricular septum with diastolic flattening and systolic flattening. Moderate tricuspid regurgitation. No pulmonic stenosis or regurgitation. Trivial pericardial effusion. PORT ABD X-RAY 8/12: IMPRESSION: 1. Nasogastric tube tip in the proximal stomach and side hole in the region of the gastroesophageal junction. 2. Curvilinear density in the inferior pelvis in the expected position of the urinary bladder, most likely representing a Foley catheter. A rectal tube is also a possibility. 3. Cardiomegaly and bilateral pleural effusions. RUQ U/S 8/13: IMPRESSION: 1. Biliary sludge and small gallstones within the gallbladder lumen. 2. No evidence of biliary obstruction. 3. Liver parenchyma mildly heterogeneous without evidence of significant steatosis or cirrhosis. A small amount of ascites is present as well as a right pleural effusion. CT ABD/PELVIS W/O CONTRAST 8/14: IMPRESSION: 1. No explanation for bilious vomiting.  No bowel obstruction. 2. Findings consistent with fluid overload with body wall edema, third-spacing, small amount of abdominopelvic ascites and moderate to large bilateral pleural effusions. 3. Cortical nephrocalcinosis. 4. Gallstones/sludge. 5. Advanced aortic atherosclerosis.  CXR 03/22/17- B/L effusions, CHF- unchanged.  I have reviewed the images  personally.  MICROBIOLOGY: MRSA PCR 8/3:  Negative  Tracheal Aspirate Culture 8/8:  Klebsiella pneumoniae Tracheal Aspirate Culture 8/9:  Klebsiella pneumoniae  ANTIBIOTICS: Cefuroxime 8/8 (periop) Cefepime 8/9 - 8/11 Rocephin 8/11 - 8/12 Vancomycin 8/9 - 8/10; Vancomycin 8/13 >>  Cipro 8/13 >> 8/19  SIGNIFICANT EVENTS: 08/01 - Admit 08/08 - RLE Fem-Pop bypass & returned to ICU intubated 08/09 - On Neo-Synephrine w/ oliguria & increasing oxygen needs 08/12 - N/V overnight >> tube feedings on hold 08/13 - Changed from Rocephin to Cipro w/ increasing LFTs 08/15 - Vitamin K 1mg  IV for INR >4.0 08/16 - N/V, CT abd negative   LINES/TUBES: OETT 8/8 >>> 8/17 L [place]le CVL 8/9 >>> R RADIAL ART LINE 8/8 >>> out Foley >>>  OGT >>> 8/17 PIV  ASSESSMENT / PLAN:  PULMONARY A: Acute on chronic hypoxic respiratory failure: Multifactorial from pneumonia, pleural effusions, and pulmonary edema. Bilateral pleural effusions: Deferring thoracentesis given coagulopathy.    P:   Stable post extubation Wean down FiO2  CARDIOVASCULAR A:  Shock: Likely multifactorial but primarily sepsis. Improving. Atrial  fibrillation Acute diastolic congestive heart failure Limb Ischemia:  S/P bypass.  P:  Holding IV lasix today for soft BP Will start PO lasix. Continue amio PO Tele monitoring  Vascular surgery following   RENAL A:   Hypokalemia: Replaced. Hypomagnesemia: Replaced.  Acute on chronic renal failure: Slowly improving. Metabolic acidosis:  Stable.   P:   Follow urine output and Cr.  Replete K Avoid nephrotoxic agents, ensure adequate renal perfusion  GASTROINTESTINAL A:   Transaminitis: Likely secondary to shock liver. Improving.  Nausea with vomiting: Unclear etiology. Possibly secondary to transaminitis.  Malnutrition with anasarca P:   PO diet. Will need to encourage intake and optimization of nutrition.  D10 as needed for hypoglycemia  HEMATOLOGIC A:    Coagulopathy: improved, received vitamin K. Known history of chronic Coumadin. Polycythemia Vera Leukocytosis: Steadily improving. Likely secondary to sepsis. Anemia: Hemoglobin stable. No evidence of active bleeding.Marland Kitchen History of DVT  P:  Heparin gtt Trend CBC  Transfuse per ICU guidelines  INFECTIOUS A:   Sepsis Klebsiella pneumonia Ischemic right foot/cellulitis  P:   Finish cipro today Repeat cultures if new fever  Empiric vancomycin for LE cellulitis, D7/X. Monitor pct to determine length of therapy needed.   ENDOCRINE A:   Hypothyroidism  P:   Continue synthroid   NEUROLOGIC A:   Mild delirium Pain control postoperatively  P:   Monitor  Prophylaxis:   Heparin drip per pharmacy consult. Protonix IV daily. Diet:  PO diet Code Status:  Full Code. Disposition:  Ok to stay in SDU. Family Update: No family at bedside am 8/17  Marshell Garfinkel MD [facility] Pager [phone] If no answer or after 3pm call: [phone] 03/22/2017, 9:16 AM

## 2017-03-22 NOTE — Progress Notes (Signed)
RN notified by telemetry that patients HR increased into the 150's and is currently sustaining in the 130's.  Pt. BP 86/63.  Pt. Asymptomatic at this time.  Updated Dr. Vaughan Browner on patients change in condition, MD to place orders.  Will continue to monitor.

## 2017-03-22 NOTE — Progress Notes (Signed)
Patient scheduled for IV Lasix 120mg  RN held due to low BP. MD updated on patients condition received order to hold lasix at this time.  Will continue to monitor.

## 2017-03-22 NOTE — Progress Notes (Signed)
PT Cancellation Note  Patient Details Name: Charlotte [name] MRN: [id number] DOB: 1941-06-03   Cancelled Treatment:    Reason Eval/Treat Not Completed: Medical issues which prohibited therapy;Patient not medically ready.  Will return at later date for PT evaluation.   Despina Pole 03/22/2017, 11:04 AM Carita Pian. Sanjuana Kava, [place] Pager [phone]

## 2017-03-22 NOTE — Progress Notes (Signed)
ANTICOAGULATION CONSULT NOTE - Follow Up Consult  Pharmacy Consult for Heparin  Indication: atrial fibrillation  No Known Allergies  Patient Measurements: Height: 6' (182.9 cm) Weight: 179 lb 0.2 oz (81.2 kg) IBW/kg (Calculated) : 73.1  Vital Signs: Temp: 98.9 F (37.2 C) (08/19 1203) Temp Source: Oral (08/19 1203) BP: 86/63 (08/19 1326) Pulse Rate: 62 (08/19 1203)  Labs:  Recent Labs  03/20/17 0432 03/20/17 0610 03/20/17 1524 03/21/17 0356 03/21/17 1216 03/21/17 1451 03/21/17 2000 03/22/17 0500  HGB 10.1*  --   --   --  11.0*  --   --  9.2*  HCT 33.0*  --   --   --  36.6  --   --  31.0*  PLT 194  --   --   --  136*  --   --  133*  LABPROT  --  19.7*  --  21.7*  --   --   --  25.1*  INR  --  1.64  --  1.86  --   --   --  2.23  HEPARINUNFRC  --  0.23* 0.49 0.54  --   --   --  0.50  CREATININE 1.57*  --   --  1.28*  --  1.34* 1.23* 1.20*    Estimated Creatinine Clearance: 47.5 mL/min (A) (by C-G formula based on SCr of 1.2 mg/dL (H)).   Marland Kitchen amiodarone 60 mg/hr (03/22/17 1400)   Followed by  . amiodarone    . dextrose 50 mL/hr at 03/21/17 1700  . heparin 1,750 Units/hr (03/21/17 2004)    Assessment: Heparin being used for AFib. Heparin level at goal this morning. No signs or symptoms of bleeding noted in the chart. Hgb down to 9.2, no mention of bleeding in note. PLTC stable, low.   Goal of Therapy: Heparin level 0.3-0.7 units/ml Monitor platelets by anticoagulation protocol: Yes   Plan:  -Continue heparin at 1750 units/hr -Monitor daily heparin levels, CBC -Watch for s/sx bleeding  Carlean Jews, Pharm.D.

## 2017-03-22 NOTE — Progress Notes (Signed)
Patient rating pain a 10/10 currently BP is maintaining in the 80's holding morphine for now due to previous drop in pressure when given.  Paged MD to request order for alternative medication.  Will continue to follow up.

## 2017-03-22 NOTE — Progress Notes (Signed)
Pharmacy Antibiotic Note  Charlotte [name] is a 76 y.o. female admitted on 03/04/2017 with pneumonia and cellulitis.  Pharmacy has been consulted for vancomycin dosing.  SCr stable today  Plan: Vancomycin 750 mg IV q24h   Height: 6' (182.9 cm) Weight: 179 lb 0.2 oz (81.2 kg) IBW/kg (Calculated) : 73.1  Temp (24hrs), Avg:98.2 F (36.8 C), Min:97.5 F (36.4 C), Max:98.9 F (37.2 C)   Recent Labs Lab 03/19/17 0422 03/19/17 1050  03/20/17 0432 03/21/17 0356 03/21/17 1216 03/21/17 1451 03/21/17 2000 03/22/17 0500 03/22/17 2200  WBC 26.4*  --   --  22.5*  --  21.2*  --   --  16.7* 23.8*  CREATININE 1.79*  --   < > 1.57* 1.28*  --  1.34* 1.23* 1.20*  --   VANCOTROUGH  --  31*  --   --   --   --   --   --   --   --   VANCORANDOM  --   --   --   --  16  --   --   --   --   --   < > = values in this interval not displayed.  Estimated Creatinine Clearance: 47.5 mL/min (A) (by C-G formula based on SCr of 1.2 mg/dL (H)).    No Known Allergies   03/22/2017 11:21 PM

## 2017-03-22 NOTE — Progress Notes (Signed)
eLink Physician-Brief Progress Note Patient Name: CAMIE HAUSS DOB: 05-16-1941 MRN: [id number]   Date of Service  03/22/2017  HPI/Events of Note  Low bp  eICU Interventions  Fluid bolus Stat cbc     Intervention Category Intermediate Interventions: Hypotension - evaluation and management  Mauri Brooklyn, P 03/22/2017, 9:49 PM

## 2017-03-23 DIAGNOSIS — R579 Shock, unspecified: Secondary | ICD-10-CM

## 2017-03-23 LAB — GLUCOSE, CAPILLARY
GLUCOSE-CAPILLARY: 92 mg/dL (ref 65–99)
GLUCOSE-CAPILLARY: 105 mg/dL — AB (ref 65–99)
GLUCOSE-CAPILLARY: 82 mg/dL (ref 65–99)
GLUCOSE-CAPILLARY: 132 mg/dL — AB (ref 65–99)
Glucose-Capillary: 83 mg/dL (ref 65–99)
Glucose-Capillary: 95 mg/dL (ref 65–99)

## 2017-03-23 LAB — HEPARIN LEVEL (UNFRACTIONATED): HEPARIN UNFRACTIONATED: 0.38 [IU]/mL (ref 0.30–0.70)

## 2017-03-23 LAB — PROCALCITONIN: PROCALCITONIN: 0.41 ng/mL

## 2017-03-23 LAB — COMPREHENSIVE METABOLIC PANEL
ALBUMIN: 2.1 g/dL — AB (ref 3.5–5.0)
ALK PHOS: 64 U/L (ref 38–126)
ALT: 38 U/L (ref 14–54)
AST: 24 U/L (ref 15–41)
Anion gap: 8 (ref 5–15)
BILIRUBIN TOTAL: 1.5 mg/dL — AB (ref 0.3–1.2)
BUN: 36 mg/dL — AB (ref 6–20)
CALCIUM: 8 mg/dL — AB (ref 8.9–10.3)
CO2: 22 mmol/L (ref 22–32)
CREATININE: 1.25 mg/dL — AB (ref 0.44–1.00)
Chloride: 106 mmol/L (ref 101–111)
GFR calc Af Amer: 48 mL/min — ABNORMAL LOW (ref >60)
GFR calc non Af Amer: 41 mL/min — ABNORMAL LOW (ref >60)
GLUCOSE: 109 mg/dL — AB (ref 65–99)
Potassium: 3.9 mmol/L (ref 3.5–5.1)
SODIUM: 136 mmol/L (ref 135–145)
TOTAL PROTEIN: 4.7 g/dL — AB (ref 6.5–8.1)

## 2017-03-23 LAB — CBC
HCT: 30 % — ABNORMAL LOW (ref 36.0–46.0)
Hemoglobin: 8.8 g/dL — ABNORMAL LOW (ref 12.0–15.0)
MCH: 23.2 pg — AB (ref 26.0–34.0)
MCHC: 29.3 g/dL — ABNORMAL LOW (ref 30.0–36.0)
MCV: 78.9 fL (ref 78.0–100.0)
PLATELETS: 151 10*3/uL (ref 150–400)
RBC: 3.8 MIL/uL — AB (ref 3.87–5.11)
RDW: 27.1 % — ABNORMAL HIGH (ref 11.5–15.5)
WBC: 23.4 10*3/uL — AB (ref 4.0–10.5)

## 2017-03-23 LAB — TROPONIN I
Troponin I: 0.07 ng/mL (ref <0.03)
Troponin I: 0.06 ng/mL (ref <0.03)

## 2017-03-23 LAB — LACTIC ACID, PLASMA: Lactic Acid, Venous: 1.1 mmol/L (ref 0.5–1.9)

## 2017-03-23 LAB — PROTIME-INR
INR: 2.37
Prothrombin Time: 26.3 seconds — ABNORMAL HIGH (ref 11.4–15.2)

## 2017-03-23 LAB — PHOSPHORUS: PHOSPHORUS: 3.2 mg/dL (ref 2.5–4.6)

## 2017-03-23 LAB — MAGNESIUM: MAGNESIUM: 1.8 mg/dL (ref 1.7–2.4)

## 2017-03-23 MED ORDER — ALBUMIN HUMAN 25 % IV SOLN
12.5000 g | Freq: Once | INTRAVENOUS | Status: AC
  Administered 2017-03-23: 12.5 g via INTRAVENOUS
  Filled 2017-03-23: qty 50

## 2017-03-23 MED ORDER — SODIUM CHLORIDE 0.9 % IV BOLUS (SEPSIS)
1000.0000 mL | Freq: Once | INTRAVENOUS | Status: AC
  Administered 2017-03-23: 1000 mL via INTRAVENOUS

## 2017-03-23 MED ORDER — PHENYLEPHRINE HCL 10 MG/ML IJ SOLN
0.0000 ug/min | INTRAMUSCULAR | Status: DC
  Administered 2017-03-23: 20 ug/min via INTRAVENOUS
  Administered 2017-03-23: 25 ug/min via INTRAVENOUS
  Administered 2017-03-23: 20 ug/min via INTRAVENOUS
  Administered 2017-03-24 (×2): 40 ug/min via INTRAVENOUS
  Filled 2017-03-23 (×5): qty 1

## 2017-03-23 MED ORDER — PHYTONADIONE 5 MG PO TABS
2.5000 mg | ORAL_TABLET | Freq: Once | ORAL | Status: AC
  Administered 2017-03-23: 2.5 mg via ORAL
  Filled 2017-03-23: qty 1

## 2017-03-23 NOTE — Progress Notes (Signed)
PT Cancellation Note  Patient Details Name: Charlotte [name] MRN: [id number] DOB: 04-06-1941   Cancelled Treatment:    Reason Eval/Treat Not Completed: Medical issues which prohibited therapy;Patient not medically ready (Pt on incr vasopressor due to hypotension. Nsg asked to HOLD)   Denice Paradise 03/23/2017, 1:56 PM [facility] [phone] (pager)

## 2017-03-23 NOTE — Progress Notes (Signed)
  Speech Language Pathology Treatment: Dysphagia  Patient Details Name: Charlotte [name] MRN: [id number] DOB: [birth year] Today's Date: 03/23/2017 Time: 5852-7782 SLP Time Calculation (min) (ACUTE ONLY): 16 min  Assessment / Plan / Recommendation Clinical Impression  Pt and family reviewed education provided Sat via primary SLP re: results MBS and need for thickener. Demonstrated thickening coffee to honey consistency which pt consumed without s/s aspiration. Her vocal quality remains strong, clear and devoid of congestion making timing of repeat MBS recommendation more difficult. Pt not complaining of thickener but reassured her that it is temporary. Continue ST.    HPI HPI: 76 year old female admitted 03/04/17 with right leg pain and discoloration, found to have pneumonia, septic shock. PMh significant for AFib, diastolic heart failure, HTN, HOH, PVD, polycysthemia. Pt underwent R fem to tp trunk bypass 03/11/17. She was intubated from 8/8-8/17. Chest x-ray 8/18 shows Lung opacities consistent with a combination of bilateral pleural effusions with pulmonary edema, atelectasis or a combination. Perihilar lung opacity appears mildly increased from the most recent prior study suggesting worsened pulmonary edema.      SLP Plan  Continue with current plan of care       Recommendations  Diet recommendations: Dysphagia 3 (mechanical soft);Honey-thick liquid Liquids provided via: Cup Medication Administration: Whole meds with liquid Supervision: Patient able to self feed;Full supervision/cueing for compensatory strategies Compensations: Slow rate;Small sips/bites;Follow solids with liquid Postural Changes and/or Swallow Maneuvers: Seated upright 90 degrees                Oral Care Recommendations: Oral care BID Follow up Recommendations:  (TBD) SLP Visit Diagnosis: Dysphagia, pharyngeal phase (R13.13) Plan: Continue with current plan of care       GO                Houston Siren 03/23/2017, 9:21 AM

## 2017-03-23 NOTE — Progress Notes (Signed)
eLink Physician-Brief Progress Note Patient Name: Charlotte [name] DOB: [birth year] MRN: [id number]   Date of Service  03/23/2017  HPI/Events of Note  Hypotension - BP = 72/51 and CVP = 6.  eICU Interventions  Will order: 1. Bolus with 0.9 NaCl 1 liter IV over 1 hour now.  2. Phenylephrine IV infusion. Titrate to MAP >= 65. 3. Transfer patient back to ICU bed.      Intervention Category Major Interventions: Hypotension - evaluation and management  Sommer,Steven Eugene 03/23/2017, 3:47 AM

## 2017-03-23 NOTE — Progress Notes (Signed)
PULMONARY / CRITICAL CARE MEDICINE   Name: Charlotte [name] MRN: [id number] DOB: 1940-10-13    ADMISSION DATE:  03/04/2017   CONSULTATION DATE:  03/11/2017  REFERRING MD:  Windell Moulding  CHIEF COMPLAINT:  Leg pain  BRIEF HPI:  76 y.o.   female with known atrial fibrillation, polycythemia, and peripheral vascular disease. Patient also has known congestive heart failure and is on chronic oxygen therapy. Admitted on 8/1 with limb ischemia and subsequently pain. Underwent bypass on 8/8 and return to the intensive care unit intubated.  SUBJECTIVE:    Transferred to SDU, but was hypotensive overnight requiring low dose phenylephrine and was moved back to ICU No complaints  VITAL SIGNS: BP (!) 86/60   Pulse 74   Temp 97.6 F (36.4 C) (Oral)   Resp (!) 25   Ht 6' (1.829 m)   Wt 83.9 kg (184 lb 15.5 oz)   SpO2 99%   BMI 25.09 kg/m   HEMODYNAMICS: CVP:  [5 mmHg-11 mmHg] 11 mmHg  VENTILATOR SETTINGS:    INTAKE / OUTPUT: I/O last 3 completed shifts: In: 4103.1 [P.O.:320; I.V.:3183.1; IV Piggyback:600] Out: 0347 [Urine:1675]  PHYSICAL EXAMINATION:  General:  Chronically ill appearing elderly female in NAD Neuro:  Alert, oriented, non-focal HEENT:  [place]/AT, No JVD noted, PERRL Cardiovascular:  RRR, no MRG Lungs:  Clear bilateral breath soudns Abdomen:  Soft, non-distended, non-tender Musculoskeletal:  No acute deformity Skin:  RLE with multiple areas of redness and weeping distal to incision site. 4th and 5th toes appear necrotic  LABS:  BMET  Recent Labs Lab 03/21/17 1451 03/21/17 2000 03/22/17 0500  NA 140 139 139  K 2.7* 3.8 3.0*  CL 104 103 106  CO2 26 26 28   BUN 49* 48* 45*  CREATININE 1.34* 1.23* 1.20*  GLUCOSE 148* 76 89    Electrolytes  Recent Labs Lab 03/21/17 0356 03/21/17 1451 03/21/17 2000 03/22/17 0500 03/23/17 0410  CALCIUM 8.2* 8.1* 8.1* 8.0*  --   MG 1.7  --   --  1.6* 1.8  PHOS 2.9  --   --  2.5 3.2    CBC  Recent Labs Lab  03/22/17 0500 03/22/17 2200 03/23/17 0410  WBC 16.7* 23.8* 23.4*  HGB 9.2* 9.5* 8.8*  HCT 31.0* 32.3* 30.0*  PLT 133* 142* 151    Coag's  Recent Labs Lab 03/21/17 0356 03/22/17 0500 03/23/17 0411  INR 1.86 2.23 2.37    Sepsis Markers  Recent Labs Lab 03/22/17 1041 03/23/17 0410  LATICACIDVEN  --  1.1  PROCALCITON 0.23 0.41    ABG  Recent Labs Lab 03/16/17 1031  PHART 7.438  PCO2ART 24.1*  PO2ART 125.0*    Liver Enzymes  Recent Labs Lab 03/18/17 0752 03/19/17 0422 03/20/17 0432  AST 47* 50* 74*  ALT 180* 134* 85*  ALKPHOS 57 67 73  BILITOT 1.2 1.6* 1.9*  ALBUMIN 1.9* 2.1* 1.9*    Cardiac Enzymes No results for input(s): TROPONINI, PROBNP in the last 168 hours.  Glucose  Recent Labs Lab 03/22/17 1248 03/22/17 1532 03/22/17 2009 03/22/17 2318 03/23/17 0358 03/23/17 0805  GLUCAP 71 81 97 106* 92 83    Imaging Dg Chest Port 1 View  Result Date: 03/15/2017 CLINICAL DATA:  Acute respiratory failure. EXAM: PORTABLE CHEST 1 VIEW COMPARISON:  One-view chest x-ray the one-view chest x-ray 03/14/2017 FINDINGS: Endotracheal tube is stable, 5 cm above the carina. Side port of the NG tube is just above the GE junction and could be advanced for more  optimal positioning. A left subclavian line is stable. Cardiac enlargement, mild edema, and bilateral effusions are again noted. Bibasilar airspace disease is present, likely reflecting atelectasis. There is no significant interval change. IMPRESSION: 1. Stable cardiomegaly, mild edema, and pleural effusions compatible with congestive heart failure. 2. Support apparatus is stable. 3. The side port of the NG tube is likely just above the GE junction and could be advanced 6-10 cm for more optimal positioning. Electronically Signed   By: San Morelle M.D.   On: 03/15/2017 07:25     IMAGING/STUDIES: PFT 08/30/13: FVC 3.57 L (95%) FEV1 2.38 L (83%) FEV1/FVC 0.66 FEF 25-75 1.34 L (60%) negative bronchodilator  response TLC 101% RV 105% DLCO uncorrected 34% RENAL U/S 8/6:  No evidence of hydronephrosis. Mildly increased renal parenchymal echogenicity may reflect medical renal disease. TTE 8/10:  LV normal in size with EF 65-70%. No regional wall motion abnormalities. Unable to assess diastolic function due to atrial fibrillation. LA & RA severely dilated. RV moderately dilated with moderate to severely reduced systolic function. No aortic stenosis or regurgitation. Aortic root normal in size. Mild mitral regurgitation without stenosis. Ventricular septum with diastolic flattening and systolic flattening. Moderate tricuspid regurgitation. No pulmonic stenosis or regurgitation. Trivial pericardial effusion. PORT ABD X-RAY 8/12: Nasogastric tube tip in the proximal stomach and side hole in the region of the gastroesophageal junction. Curvilinear density in the inferior pelvis in the expected position of the urinary bladder, most likely representing a Foley catheter. A rectal tube is also a possibility. Cardiomegaly and bilateral pleural effusions. RUQ U/S 8/13: Biliary sludge and small gallstones within the gallbladder lumen. No evidence of biliary obstruction. Liver parenchyma mildly heterogeneous without evidence of significant steatosis or cirrhosis. A small amount of ascites is present as well as a right pleural effusion. CT ABD/PELVIS W/O CONTRAST 8/14: No explanation for bilious vomiting.  No bowel obstruction. Findings consistent with fluid overload with body wall edema, third-spacing, small amount of abdominopelvic ascites and moderate to large bilateral pleural effusions. Cortical nephrocalcinosis. Gallstones/sludge. Advanced aortic atherosclerosis.  MICROBIOLOGY: MRSA PCR 8/3:  Negative  Tracheal Aspirate Culture 8/8:  Klebsiella pneumoniae Tracheal Aspirate Culture 8/9:  Klebsiella pneumoniae  ANTIBIOTICS: Cefuroxime 8/8 (periop) Cefepime 8/9 - 8/11 Rocephin 8/11 - 8/12 Cipro 8/13 >>  8/19 Vancomycin 8/9 - 8/10; Vancomycin 8/13 >>   SIGNIFICANT EVENTS: 08/01 - Admit 08/08 - RLE Fem-Pop bypass & returned to ICU intubated 08/09 - On Neo-Synephrine w/ oliguria & increasing oxygen needs 08/12 - N/V overnight >> tube feedings on hold 08/13 - Changed from Rocephin to Cipro w/ increasing LFTs 08/15 - Vitamin K 1mg  IV for INR >4.0 08/16 - N/V, CT abd negative   LINES/TUBES: OETT 8/8 >>> 8/17 L [place] CVL 8/9 >>> R RADIAL ART LINE 8/8 >>> out Foley >>>  OGT >>> 8/17 PIV  ASSESSMENT / PLAN:  PULMONARY A: Acute on chronic hypoxic respiratory failure: Multifactorial from pneumonia, pleural effusions, and pulmonary edema. Bilateral pleural effusions: Deferring thoracentesis given coagulopathy.    P:   Supplemental O2 3L Wean down FiO2 as tolerated for SpO2 > 92% Incentive spirometry  CARDIOVASCULAR A:  Shock: Likely multifactorial but primarily sepsis. Improving. Atrial fibrillation Acute diastolic congestive heart failure Limb Ischemia:  S/P bypass.  P:  Tele monitoring  Holding IV lasix today for hypotension Continue amio infusion, will plan for transition to oral 8/21 Continue low dose phenylephrine as needed to keep MAP > 29mmHg Repeat Echo, cycle troponin Consider albumin Vascular surgery following  Ideally  would KVO IVF, but needing D10 at 50/hr to keep glucose up.   RENAL A:   Hypokalemia. Hypomagnesemia:  Acute on chronic renal failure: Slowly improving.  P:   Follow urine output and Cr.  BMP now Correct electrolytes as indicated  GASTROINTESTINAL A:   Transaminitis: Likely secondary to shock liver. Improving.  Nausea with vomiting: Unclear etiology. Possibly secondary to transaminitis.  Malnutrition with anasarca P:   PO diet. Will need to encourage intake and optimization of nutrition.  D10 as needed for hypoglycemia Protonix  HEMATOLOGIC A:   Coagulopathy: improved, received vitamin K. Known history of chronic  Coumadin. Polycythemia Vera Leukocytosis: Steadily improving. Likely secondary to sepsis. Anemia: Hemoglobin stable. No evidence of active bleeding.Marland Kitchen History of DVT  P:  Holding heparin gtt as INR creeping up Vit K 2.5mg  PO Coags in AM Trend CBC  Transfuse per ICU guidelines  INFECTIOUS A:   Sepsis Klebsiella pneumonia Ischemic right foot/cellulitis  P:   Empiric vancomycin for LE cellulitis, D7/X. Monitor pct to determine length of therapy needed.  Repeat cultures if new fever   ENDOCRINE A:   Hypothyroidism  P:   Continue synthroid   NEUROLOGIC A:   Mild delirium Pain control postoperatively  P:   Monitor Morphine q4hours PRN  No family at bedside 8/20.  Georgann Housekeeper, AGACNP-BC [facility] Pulmonology/Critical Care Pager [phone] or [phone]  03/23/2017 9:48 AM

## 2017-03-23 NOTE — Significant Event (Signed)
Rapid Response Event Note  Overview: Time Called: 0330 Arrival Time: 0330 Event Type: Hypotension  Initial Focused Assessment:  76 y.o. Female pt of CCM and Vascular admitted on 8/1 with R leg ischemia.  RLE fem-pop bypass done on 8/8.  Recently transferred out of ICU on 8/18, now with persistent hypotension despite multiple fluid boluses and 12.5g albumin.    The pt is alert, answers questions appropriately.  Lung sounds are clear bilaterally.  Abdomen is soft and non distended.  2 + radial pulses. A fib on monitor, rate controlled with amio gtt. Swelling noted to RLE,  Lower incision site appears inflamed, skin is warm. Foley in place, 150 mls in chamber, hematuria present.       VS at 0330:  71/55, HR 83, RR 22, SpO2 99% on 2 L.  CVP 7.  CBG 93.  Amiodarone infusing at 30mg /hr.  Heparin infusing at 17.5 mls/hr.  D10 running at 50 ml/hr.  Pt has received a total of 1.5 L throughout the night with no improvement in BP.  Dr. Emmit Alexanders notified by primary RN at 0330, orders received to start Neo-synephrine and transfer to ICU.  MD states to keep Amiodarone infusion running at this time.  Morning labs drawn prior to transport.  Lactic acid ordered and drawn per protocol.    Neo started at 20 mcg/min at 0415 through L IJ CVC.  MAP improved to >65.    Pt transferred to [facility] ICU for further care at 0500.      Event Summary: Name of Physician Notified: Emmit Alexanders, MD at 0335    at    Outcome: Transferred (Comment)     Pam Drown

## 2017-03-23 NOTE — Progress Notes (Signed)
eLink Physician-Brief Progress Note Patient Name: Charlotte [name] DOB: 1941/02/20 MRN: [id number]   Date of Service  03/23/2017  HPI/Events of Note  Hypotension - BP = 71/47 with MAP = 56. Albumin = 1.9.   eICU Interventions  Will order: 1. Albumin 25% 12.5 gm IV now.  2. Monitor CVP.     Intervention Category Major Interventions: Hypotension - evaluation and management  Sommer,Steven Eugene 03/23/2017, 1:02 AM

## 2017-03-23 NOTE — Progress Notes (Signed)
ANTICOAGULATION CONSULT NOTE - Follow Up Consult  Pharmacy Consult for Heparin  Indication: atrial fibrillation  No Known Allergies  Patient Measurements: Height: 6' (182.9 cm) Weight: 184 lb 15.5 oz (83.9 kg) IBW/kg (Calculated) : 73.1  Vital Signs: Temp: 97.6 F (36.4 C) (08/20 0811) Temp Source: Oral (08/20 0811) BP: 95/60 (08/20 0945) Pulse Rate: 98 (08/20 0945)  Labs:  Recent Labs  03/21/17 0356  03/21/17 1451 03/21/17 2000 03/22/17 0500 03/22/17 2200 03/23/17 0410 03/23/17 0411  HGB  --   < >  --   --  9.2* 9.5* 8.8*  --   HCT  --   < >  --   --  31.0* 32.3* 30.0*  --   PLT  --   < >  --   --  133* 142* 151  --   LABPROT 21.7*  --   --   --  25.1*  --   --  26.3*  INR 1.86  --   --   --  2.23  --   --  2.37  HEPARINUNFRC 0.54  --   --   --  0.50  --   --  0.38  CREATININE 1.28*  --  1.34* 1.23* 1.20*  --   --   --   < > = values in this interval not displayed.  Estimated Creatinine Clearance: 47.5 mL/min (A) (by C-G formula based on SCr of 1.2 mg/dL (H)).   Marland Kitchen amiodarone 30 mg/hr (03/22/17 2001)  . dextrose 50 mL/hr at 03/21/17 1700  . heparin 1,750 Units/hr (03/23/17 0626)  . phenylephrine (NEO-SYNEPHRINE) Adult infusion 15 mcg/min (03/23/17 0942)  . vancomycin Stopped (03/23/17 0100)    Assessment: 76 yoF on warfarin PTA for AFib initially held and bridged with heparin given need for procedures. INR trended back up and heparin was held, then resumed 8/16 with subtherapeutic INR s/p vitamin K. INR has trended back up today to 2.37 so will hold heparin for now.  Goal of Therapy: Heparin level 0.3-0.7 units/ml Monitor platelets by anticoagulation protocol: Yes   Plan:  -Hold heparin as INR >2 -Monitor daily INR  Arrie Senate, PharmD PGY-2 Cardiology Pharmacy Resident Pager: [phone] 03/23/2017

## 2017-03-23 NOTE — Progress Notes (Signed)
CRITICAL VALUE ALERT  Critical Value:  Troponin 0.07  Date & Time Notied:  03/23/2017 1140  Provider Notified: Dr. Ashok Cordia  Orders Received/Actions taken: no new orders at this time

## 2017-03-23 NOTE — Care Management Note (Signed)
Case Management Note [name] RN, BSN Unit 4E-Case Manager [phone]  Patient Details  Name: Charlotte [name] MRN: [id number] Date of Birth: 1941-04-30  Subjective/Objective:     Pt admitted with LE arterial insufficiency- plan for OR on Thur 8/9 for  Fem-tibioperoneal bypass for attempted limb salvage- remains on IV heparin              Action/Plan: PTA pt lived at home with spouse- will need PT/OT evals when able to participate to assist in d/c planning- pt may be eligible for Home First program with [facility]- CM will continue to follow post op for d/c needs and plan.   Expected Discharge Date:                 Expected Discharge Plan:  [place]  In-House Referral:  Clinical Social Work  Discharge planning Services  CM Consult  Post Acute Care Choice:    Choice offered to:     DME Arranged:    DME Agency:     HH Arranged:    [place] Agency:     Status of Service:  In process, will continue to follow  If discussed at Long Length of Stay Meetings, dates discussed:  8/7  Discharge Disposition:   Additional Comments:  03/23/17- 1120- Kwesi Sangha RN, CM- pt had been tx to SDU- however had low BP and had to be started on neo and returned to ICU for further care and monitoring- also requiring d10 to keep glucose up. CM to continue to follow for d/c needs- SNF vs HomeFirst? Pending progress when medically stable.    [name], RN 03/20/2017, 3:48 PM--Pt extubated today.  PT eval pending  03/19/17 Discussed in LOS 8/16 - pt remains appropriate for continued stay.  Pt remains on ventilator and not following commands  03/18/17 Informed by [facility] that pt was referred to them by Vascular Surgery for  [facility], PT and OT if needed.    Discussed in LOS 8/14 - pt remains appropriate for continued stay.  LTACH referral given during LOS - however attending has deemed LTACH referral at this time to be inappropriate; pt remains on ETT vent and not  stable to transfer.   Dahlia Client [place], RN 03/23/2017, 11:19 AM [phone]

## 2017-03-23 NOTE — Progress Notes (Signed)
Spoke with Dr. Tamala Julian at [facility] about my concerns with Charlotte [name]' low BP, hematuria, and elevated HR. He ordered a 500 ml NS bolus and a stat CBC. Patient was alert and oriented and still making plenty of urine, but fingertips on right hand slightly dusky.Will continue to monitor closely.

## 2017-03-23 NOTE — Progress Notes (Signed)
OT Cancellation Note  Patient Details Name: Charlotte [name] MRN: [id number] DOB: 09/01/1940   Cancelled Treatment:    Reason Eval/Treat Not Completed: Medical issues which prohibited therapy.  Pt on pressors.  Will reattempt.  [place], OTR/L [phone]   [name] 03/23/2017, 1:55 PM

## 2017-03-23 NOTE — Progress Notes (Signed)
Spoke with Dr. Oletta Darter at [facility] with concerns about patients low BP and how it wasn't improving. Patient A/O X 4, extremities warm and pink now. HR improved to 90-100's. He ordered Albumin and asked me to check her CVP. Will continue to monitor.

## 2017-03-23 NOTE — Progress Notes (Addendum)
[facility] [place]  Subjective  - Comfortable, no change in active movement of the right foot and toes.   Objective (!) 94/56 89 98.6 F (37 C) (Oral) (!) 21 97%  Intake/Output Summary (Last 24 hours) at 03/23/17 1225 Last data filed at 03/23/17 0930  Gross per 24 hour  Intake          2663.06 ml  Output              750 ml  Net          1913.06 ml    Doppler signal right AT, left DP/PT No active range of motion in foot or toes, plantar tips of toes 4th and 5th ischemic changes. Medical foot and min lateral skin/dermas raw.  S/P blister.   Right leg incisions healing well over all  Assessment/Planning: POD #12  PROCEDURE:  1. US guided placement of right radial arterial monitoring line 2. Harvest of right greater saphenous vein 3. Redo exposure of right common femoral artery > 30 days 4. Right femoral to tp trunk bypass with in situ gsv  Daily dressing changes to lower leg and foot. Allowing foot to demarcate, by pass is patent, no change in active motion. A fib on Heparin.  Laurence Slate [facility] 03/23/2017 12:25 PM --  Laboratory Lab Results:  Recent Labs  03/22/17 2200 03/23/17 0410  WBC 23.8* 23.4*  HGB 9.5* 8.8*  HCT 32.3* 30.0*  PLT 142* 151   BMET  Recent Labs  03/22/17 0500 03/23/17 1052  NA 139 136  K 3.0* 3.9  CL 106 106  CO2 28 22  GLUCOSE 89 109*  BUN 45* 36*  CREATININE 1.20* 1.25*  CALCIUM 8.0* 8.0*    COAG Lab Results  Component Value Date   INR 2.37 03/23/2017   INR 2.23 03/22/2017   INR 1.86 03/21/2017   No results found for: PTT  I have examined the patient, reviewed and agree with above.  Curt Jews, MD 03/23/2017 3:17 PM

## 2017-03-24 ENCOUNTER — Inpatient Hospital Stay (HOSPITAL_COMMUNITY): Payer: Medicare Other

## 2017-03-24 DIAGNOSIS — I340 Nonrheumatic mitral (valve) insufficiency: Secondary | ICD-10-CM

## 2017-03-24 LAB — RENAL FUNCTION PANEL
ALBUMIN: 2.1 g/dL — AB (ref 3.5–5.0)
Anion gap: 6 (ref 5–15)
BUN: 30 mg/dL — ABNORMAL HIGH (ref 6–20)
CHLORIDE: 105 mmol/L (ref 101–111)
CO2: 24 mmol/L (ref 22–32)
Calcium: 8.1 mg/dL — ABNORMAL LOW (ref 8.9–10.3)
Creatinine, Ser: 1.16 mg/dL — ABNORMAL HIGH (ref 0.44–1.00)
GFR calc non Af Amer: 45 mL/min — ABNORMAL LOW (ref >60)
GFR, EST AFRICAN AMERICAN: 52 mL/min — AB (ref >60)
Glucose, Bld: 98 mg/dL (ref 65–99)
PHOSPHORUS: 3.3 mg/dL (ref 2.5–4.6)
POTASSIUM: 3.7 mmol/L (ref 3.5–5.1)
Sodium: 135 mmol/L (ref 135–145)

## 2017-03-24 LAB — MAGNESIUM: MAGNESIUM: 1.7 mg/dL (ref 1.7–2.4)

## 2017-03-24 LAB — GLUCOSE, CAPILLARY
GLUCOSE-CAPILLARY: 82 mg/dL (ref 65–99)
GLUCOSE-CAPILLARY: 91 mg/dL (ref 65–99)
GLUCOSE-CAPILLARY: 105 mg/dL — AB (ref 65–99)
Glucose-Capillary: 88 mg/dL (ref 65–99)
Glucose-Capillary: 92 mg/dL (ref 65–99)
Glucose-Capillary: 97 mg/dL (ref 65–99)

## 2017-03-24 LAB — CBC
HCT: 32.6 % — ABNORMAL LOW (ref 36.0–46.0)
Hemoglobin: 9.8 g/dL — ABNORMAL LOW (ref 12.0–15.0)
MCH: 23.7 pg — ABNORMAL LOW (ref 26.0–34.0)
MCHC: 30.1 g/dL (ref 30.0–36.0)
MCV: 78.7 fL (ref 78.0–100.0)
PLATELETS: 226 10*3/uL (ref 150–400)
RBC: 4.14 MIL/uL (ref 3.87–5.11)
RDW: 28.2 % — AB (ref 11.5–15.5)
WBC: 39.1 10*3/uL — AB (ref 4.0–10.5)

## 2017-03-24 LAB — HEPARIN LEVEL (UNFRACTIONATED): Heparin Unfractionated: 0.26 IU/mL — ABNORMAL LOW (ref 0.30–0.70)

## 2017-03-24 LAB — TROPONIN I
Troponin I: 0.07 ng/mL (ref <0.03)
Troponin I: 0.08 ng/mL (ref <0.03)
Troponin I: 0.09 ng/mL (ref <0.03)
Troponin I: 0.2 ng/mL (ref <0.03)

## 2017-03-24 LAB — ECHOCARDIOGRAM COMPLETE
HEIGHTINCHES: 72 in
Weight: 2959.46 oz

## 2017-03-24 LAB — PROTIME-INR
INR: 1.6
PROTHROMBIN TIME: 19.2 s — AB (ref 11.4–15.2)

## 2017-03-24 LAB — TSH: TSH: 17.284 u[IU]/mL — ABNORMAL HIGH (ref 0.350–4.500)

## 2017-03-24 LAB — PROCALCITONIN: Procalcitonin: 0.22 ng/mL

## 2017-03-24 MED ORDER — HEPARIN (PORCINE) IN NACL 100-0.45 UNIT/ML-% IJ SOLN
1750.0000 [IU]/h | INTRAMUSCULAR | Status: DC
Start: 2017-03-24 — End: 2017-03-29
  Administered 2017-03-24: 1750 [IU]/h via INTRAVENOUS
  Administered 2017-03-24: 1850 [IU]/h via INTRAVENOUS
  Administered 2017-03-25 – 2017-03-27 (×4): 1900 [IU]/h via INTRAVENOUS
  Administered 2017-03-28 (×2): 1750 [IU]/h via INTRAVENOUS
  Filled 2017-03-24 (×9): qty 250

## 2017-03-24 MED ORDER — AMIODARONE HCL 200 MG PO TABS
200.0000 mg | ORAL_TABLET | Freq: Two times a day (BID) | ORAL | Status: DC
  Administered 2017-03-24 – 2017-03-29 (×10): 200 mg via ORAL
  Filled 2017-03-24 (×10): qty 1

## 2017-03-24 MED ORDER — PANTOPRAZOLE SODIUM 40 MG PO TBEC
40.0000 mg | DELAYED_RELEASE_TABLET | Freq: Every day | ORAL | Status: DC
  Administered 2017-03-24: 40 mg via ORAL
  Filled 2017-03-24: qty 1

## 2017-03-24 MED ORDER — PANTOPRAZOLE SODIUM 20 MG PO TBEC
20.0000 mg | DELAYED_RELEASE_TABLET | Freq: Every day | ORAL | Status: DC
  Administered 2017-03-25 – 2017-03-29 (×5): 20 mg via ORAL
  Filled 2017-03-24 (×5): qty 1

## 2017-03-24 MED ORDER — BOOST / RESOURCE BREEZE PO LIQD: 1.0000 | Freq: Three times a day (TID) | ORAL | Status: DC

## 2017-03-24 MED ORDER — SODIUM CHLORIDE 0.9 % IV SOLN
0.0000 ug/min | INTRAVENOUS | Status: DC
  Administered 2017-03-24: 15 ug/min via INTRAVENOUS
  Filled 2017-03-24: qty 4

## 2017-03-24 MED ORDER — AMIODARONE HCL 200 MG PO TABS
200.0000 mg | ORAL_TABLET | Freq: Every day | ORAL | Status: DC
  Administered 2017-03-24: 200 mg via ORAL
  Filled 2017-03-24: qty 1

## 2017-03-24 NOTE — Progress Notes (Addendum)
Progress Note  SUBJECTIVE:    No significant right foot pain.  OBJECTIVE:   Vitals:   03/24/17 0600 03/24/17 0830  BP: (!) 98/59   Pulse:    Resp: (!) 30   Temp:  98.6 F (37 C)  SpO2: 94%     Intake/Output Summary (Last 24 hours) at 03/24/17 0923 Last data filed at 03/24/17 0600  Gross per 24 hour  Intake          2772.62 ml  Output             1425 ml  Net          1347.62 ml   Right leg incisions clean.  Right leg with blistering to distal shin and foot.  No motor function right toes. Dry gangrene tips right toes Right AT doppler signal  ASSESSMENT/PLAN:   76 y.o. female is s/p: right femoral to tibial artery bypass with in situ great saphenous vein 13 Days Post-Op   Bypass patent. Will allow foot to continue to demarcate. Does not appear infected.  Continue xeroform to skin blisters.  Still requiring vasopressors.   Alvia Grove 03/24/2017 9:23 AM -- LABS:   CBC    Component Value Date/Time   WBC 23.4 (H) 03/23/2017 0410   HGB 8.8 (L) 03/23/2017 0410   HCT 30.0 (L) 03/23/2017 0410   PLT 151 03/23/2017 0410    BMET    Component Value Date/Time   NA 136 03/23/2017 1052   K 3.9 03/23/2017 1052   CL 106 03/23/2017 1052   CO2 22 03/23/2017 1052   GLUCOSE 109 (H) 03/23/2017 1052   BUN 36 (H) 03/23/2017 1052   CREATININE 1.25 (H) 03/23/2017 1052   CREATININE 1.21 (H) 08/14/2016 0733   CALCIUM 8.0 (L) 03/23/2017 1052   GFRNONAA 41 (L) 03/23/2017 1052   GFRAA 48 (L) 03/23/2017 1052    COAG Lab Results  Component Value Date   INR 2.37 03/23/2017   INR 2.23 03/22/2017   INR 1.86 03/21/2017   No results found for: PTT  ANTIBIOTICS:   Anti-infectives    Start     Dose/Rate Route Frequency Ordered Stop   03/22/17 2359  vancomycin (VANCOCIN) IVPB 750 mg/150 ml premix     750 mg 150 mL/hr over 60 Minutes Intravenous Daily at bedtime 03/22/17 2323     03/22/17 0200  ciprofloxacin (CIPRO) IVPB 400 mg  Status:  Discontinued     400 mg 200  mL/hr over 60 Minutes Intravenous Every 12 hours 03/21/17 1529 03/22/17 0930   03/21/17 0700  vancomycin (VANCOCIN) IVPB 750 mg/150 ml premix     750 mg 150 mL/hr over 60 Minutes Intravenous  Once 03/21/17 0656 03/21/17 0845   03/17/17 1100  vancomycin (VANCOCIN) IVPB 750 mg/150 ml premix  Status:  Discontinued     750 mg 150 mL/hr over 60 Minutes Intravenous Every 24 hours 03/16/17 0929 03/19/17 1147   03/16/17 1000  ciprofloxacin (CIPRO) IVPB 400 mg  Status:  Discontinued     400 mg 200 mL/hr over 60 Minutes Intravenous Every 24 hours 03/16/17 0858 03/21/17 1529   03/16/17 1000  vancomycin (VANCOCIN) 2,000 mg in sodium chloride 0.9 % 500 mL IVPB     2,000 mg 250 mL/hr over 120 Minutes Intravenous  Once 03/16/17 0929 03/16/17 1250   03/15/17 1000  cefTRIAXone (ROCEPHIN) 1 g in dextrose 5 % 50 mL IVPB  Status:  Discontinued     1 g 100 mL/hr over 30 Minutes  Intravenous Every 24 hours 03/14/17 1054 03/16/17 0841   03/14/17 1100  cefTRIAXone (ROCEPHIN) 1 g in dextrose 5 % 50 mL IVPB  Status:  Discontinued     1 g 100 mL/hr over 30 Minutes Intravenous Every 24 hours 03/14/17 1054 03/14/17 1054   03/13/17 1100  vancomycin (VANCOCIN) 1,250 mg in sodium chloride 0.9 % 250 mL IVPB  Status:  Discontinued     1,250 mg 166.7 mL/hr over 90 Minutes Intravenous Every 24 hours 03/12/17 0937 03/13/17 0741   03/13/17 1100  vancomycin (VANCOCIN) IVPB 1000 mg/200 mL premix  Status:  Discontinued     1,000 mg 200 mL/hr over 60 Minutes Intravenous Every 24 hours 03/13/17 0741 03/14/17 1053   03/12/17 1000  ceFEPIme (MAXIPIME) 2 g in dextrose 5 % 50 mL IVPB  Status:  Discontinued     2 g 100 mL/hr over 30 Minutes Intravenous Every 24 hours 03/12/17 0931 03/14/17 1053   03/12/17 1000  vancomycin (VANCOCIN) 1,750 mg in sodium chloride 0.9 % 500 mL IVPB     1,750 mg 250 mL/hr over 120 Minutes Intravenous  Once 03/12/17 0931 03/12/17 1210   03/11/17 2000  cefUROXime (ZINACEF) 1.5 g in dextrose 5 % 50 mL IVPB      1.5 g 100 mL/hr over 30 Minutes Intravenous Every 12 hours 03/11/17 1955 03/12/17 0930   03/11/17 1245  cefUROXime (ZINACEF) 1.5 g in dextrose 5 % 50 mL IVPB     1.5 g 100 mL/hr over 30 Minutes Intravenous On call to O.R. 03/11/17 1244 03/11/17 [place], PA-C [facility] Office: [phone] Pager: [phone] 03/24/2017 9:23 AM   I have independently interviewed and examined patient and agree with PA assessment and plan above. Allowing foot to demarcate but may need bka in future.    Markelle Asaro C. [name], MD [facility] [place] Office: [phone] Pager: [phone]

## 2017-03-24 NOTE — Progress Notes (Signed)
PULMONARY / CRITICAL CARE MEDICINE   Name: Charlotte [name] MRN: [id number] DOB: [birth year]    ADMISSION DATE:  03/04/2017   CONSULTATION DATE:  03/11/2017  REFERRING MD:  Windell Moulding  CHIEF COMPLAINT:  Leg pain  BRIEF HPI:  76 y.o.   female with known atrial fibrillation, polycythemia, and peripheral vascular disease. Patient also has known congestive heart failure and is on chronic oxygen therapy. Admitted on 8/1 with limb ischemia and subsequently pain. Underwent bypass on 8/8 and return to the intensive care unit intubated.  SUBJECTIVE:    No acute events overnight. Remains 15 neo.   VITAL SIGNS: BP (!) 98/59   Pulse 81   Temp 98.6 F (37 C) (Oral)   Resp (!) 30   Ht 6' (1.829 m)   Wt 83.9 kg (184 lb 15.5 oz)   SpO2 94%   BMI 25.09 kg/m   HEMODYNAMICS: CVP:  [5 mmHg-11 mmHg] 9 mmHg  VENTILATOR SETTINGS:    INTAKE / OUTPUT: I/O last 3 completed shifts: In: 5391.6 [P.O.:340; I.V.:4651.6; IV Piggyback:400] Out: 1975 [[id number]]  PHYSICAL EXAMINATION:  General:  Chronically ill appearing elderly female in NAD Neuro:  Alert, oriented, non-focal. HOH HEENT: [place]/AT, PERRL, no JVD Cardiovascular:  RRR, no MRG Lungs:  Clear, unlabored Abdomen:  Soft, non-distended, non-tender Musculoskeletal:  No acute feformity Skin:  RLE with multiple areas of redness and weeping. Dressing in place CDI  LABS:  BMET  Recent Labs Lab 03/21/17 2000 03/22/17 0500 03/23/17 1052  NA 139 139 136  K 3.8 3.0* 3.9  CL 103 106 106  CO2 26 28 22   BUN 48* 45* 36*  CREATININE 1.23* 1.20* 1.25*  GLUCOSE 76 89 109*    Electrolytes  Recent Labs Lab 03/21/17 0356  03/21/17 2000 03/22/17 0500 03/23/17 0410 03/23/17 1052  CALCIUM 8.2*  < > 8.1* 8.0*  --  8.0*  MG 1.7  --   --  1.6* 1.8  --   PHOS 2.9  --   --  2.5 3.2  --   < > = values in this interval not displayed.  CBC  Recent Labs Lab 03/22/17 0500 03/22/17 2200 03/23/17 0410  WBC 16.7* 23.8* 23.4*  HGB 9.2*  9.5* 8.8*  HCT 31.0* 32.3* 30.0*  PLT 133* 142* 151    Coag's  Recent Labs Lab 03/21/17 0356 03/22/17 0500 03/23/17 0411  INR 1.86 2.23 2.37    Sepsis Markers  Recent Labs Lab 03/22/17 1041 03/23/17 0410  LATICACIDVEN  --  1.1  PROCALCITON 0.23 0.41    ABG No results for input(s): PHART, PCO2ART, PO2ART in the last 168 hours.  Liver Enzymes  Recent Labs Lab 03/19/17 0422 03/20/17 0432 03/23/17 1052  AST 50* 74* 24  ALT 134* 85* 38  ALKPHOS 67 73 64  BILITOT 1.6* 1.9* 1.5*  ALBUMIN 2.1* 1.9* 2.1*    Cardiac Enzymes  Recent Labs Lab 03/23/17 1052 03/23/17 1710 03/23/17 2344  TROPONINI 0.07* 0.06* 0.20*    Glucose  Recent Labs Lab 03/23/17 1147 03/23/17 1646 03/23/17 2052 03/23/17 2324 03/24/17 0321 03/24/17 0829  GLUCAP 105* 95 82 132* 82 91    Imaging Dg Chest Port 1 View  Result Date: 03/15/2017 CLINICAL DATA:  Acute respiratory failure. EXAM: PORTABLE CHEST 1 VIEW COMPARISON:  One-view chest x-ray the one-view chest x-ray 03/14/2017 FINDINGS: Endotracheal tube is stable, 5 cm above the carina. Side port of the NG tube is just above the GE junction and could be advanced for more optimal  positioning. A left subclavian line is stable. Cardiac enlargement, mild edema, and bilateral effusions are again noted. Bibasilar airspace disease is present, likely reflecting atelectasis. There is no significant interval change. IMPRESSION: 1. Stable cardiomegaly, mild edema, and pleural effusions compatible with congestive heart failure. 2. Support apparatus is stable. 3. The side port of the NG tube is likely just above the GE junction and could be advanced 6-10 cm for more optimal positioning. Electronically Signed   By: San Morelle M.D.   On: 03/15/2017 07:25     IMAGING/STUDIES: PFT 08/30/13: FVC 3.57 L (95%) FEV1 2.38 L (83%) FEV1/FVC 0.66 FEF 25-75 1.34 L (60%) negative bronchodilator response TLC 101% RV 105% DLCO uncorrected 34% RENAL U/S  8/6:  No evidence of hydronephrosis. Mildly increased renal parenchymal echogenicity may reflect medical renal disease. TTE 8/10:  LV normal in size with EF 65-70%. No regional wall motion abnormalities. Unable to assess diastolic function due to atrial fibrillation. LA & RA severely dilated. RV moderately dilated with moderate to severely reduced systolic function. No aortic stenosis or regurgitation. Aortic root normal in size. Mild mitral regurgitation without stenosis. Ventricular septum with diastolic flattening and systolic flattening. Moderate tricuspid regurgitation. No pulmonic stenosis or regurgitation. Trivial pericardial effusion. PORT ABD X-RAY 8/12: Nasogastric tube tip in the proximal stomach and side hole in the region of the gastroesophageal junction. Curvilinear density in the inferior pelvis in the expected position of the urinary bladder, most likely representing a Foley catheter. A rectal tube is also a possibility. Cardiomegaly and bilateral pleural effusions. RUQ U/S 8/13: Biliary sludge and small gallstones within the gallbladder lumen. No evidence of biliary obstruction. Liver parenchyma mildly heterogeneous without evidence of significant steatosis or cirrhosis. A small amount of ascites is present as well as a right pleural effusion. CT ABD/PELVIS W/O CONTRAST 8/14: No explanation for bilious vomiting.  No bowel obstruction. Findings consistent with fluid overload with body wall edema, third-spacing, small amount of abdominopelvic ascites and moderate to large bilateral pleural effusions. Cortical nephrocalcinosis. Gallstones/sludge. Advanced aortic atherosclerosis.  MICROBIOLOGY: MRSA PCR 8/3:  Negative  Tracheal Aspirate Culture 8/8:  Klebsiella pneumoniae Tracheal Aspirate Culture 8/9:  Klebsiella pneumoniae  ANTIBIOTICS: Cefuroxime 8/8 (periop) Cefepime 8/9 - 8/11 Rocephin 8/11 - 8/12 Cipro 8/13 >> 8/19 Vancomycin 8/9 - 8/10; Vancomycin 8/13 >> 8/21  SIGNIFICANT  EVENTS: 08/01 - Admit 08/08 - RLE Fem-Pop bypass & returned to ICU intubated 08/09 - On Neo-Synephrine w/ oliguria & increasing oxygen needs 08/12 - N/V overnight >> tube feedings on hold 08/13 - Changed from Rocephin to Cipro w/ increasing LFTs 08/15 - Vitamin K 1mg  IV for INR >4.0 08/16 - N/V, CT abd negative  08/20 - back to ICU for low dose pressors.   LINES/TUBES: OETT 8/8 >>> 8/17 L [place] CVL 8/9 >>> R RADIAL ART LINE 8/8 >>> out Foley >>>  OGT >>> 8/17 PIV  ASSESSMENT / PLAN:  PULMONARY A: Acute on chronic hypoxic respiratory failure: Multifactorial from pneumonia, pleural effusions, and pulmonary edema. Bilateral pleural effusions: Deferring thoracentesis given coagulopathy.    P:   Supplemental O2 3L Wean down FiO2 as tolerated for SpO2 > 92% Incentive spirometry  CARDIOVASCULAR A:  Shock: Likely multifactorial but primarily sepsis. Improving. Atrial fibrillation Acute diastolic congestive heart failure Limb Ischemia:  S/P bypass.  P:  Tele monitoring  Holding IV lasix in setting hypotension Will transition amio to PO (diltiazem at home) Continue low dose phenylephrine as needed to keep MAP > 41mmHg Echo pending Continue to  trend troponin.  Vascular surgery following  Ideally would KVO IVF, but needing D10 at 50/hr to keep glucose up.   RENAL A:   Hypokalemia. Hypomagnesemia. Acute on chronic renal failure: Slowly improving.  P:   Follow urine output and Cr.  BMP now Correct electrolytes as indicated  GASTROINTESTINAL A:   Transaminitis: Likely secondary to shock liver. Improving.  Nausea with vomiting Malnutrition with anasarca P:   PO diet. Will need to encourage intake and optimization of nutrition.  D10 as needed for hypoglycemia Protonix PO  HEMATOLOGIC A:   Coagulopathy: improved, received vitamin K. Known history of chronic Coumadin. Polycythemia Vera Leukocytosis: Steadily improving. Likely secondary to sepsis. Anemia: Hemoglobin  stable. No evidence of active bleeding.Marland Kitchen History of DVT  P:  Awaiting coags to determine need for heparin Trend CBC, coags Transfuse per ICU guidelines  INFECTIOUS A:   Sepsis Klebsiella pneumonia Ischemic right foot/cellulitis  P:   DC vancomycin Repeat cultures if new fever   ENDOCRINE A:   Hypothyroidism  P:   Continue synthroid   NEUROLOGIC A:   Mild delirium Pain control postoperatively  P:   Monitor Morphine q4hours PRN   Georgann Housekeeper, AGACNP-BC [facility] Pulmonology/Critical Care Pager [phone] or [phone]  03/24/2017 9:38 AM

## 2017-03-24 NOTE — Progress Notes (Signed)
ANTICOAGULATION CONSULT NOTE - Follow Up Consult  Pharmacy Consult for Heparin  Indication: atrial fibrillation  No Known Allergies  Patient Measurements: Height: 6' (182.9 cm) Weight: 184 lb 15.5 oz (83.9 kg) IBW/kg (Calculated) : 73.1  Vital Signs: Temp: 98.2 F (36.8 C) (08/21 1941) Temp Source: Oral (08/21 1941) BP: 92/48 (08/21 1941) Pulse Rate: 98 (08/21 1941)  Labs:  Recent Labs  03/22/17 0500 03/22/17 2200 03/23/17 0410 03/23/17 0411 03/23/17 1052  03/23/17 2344 03/24/17 0951 03/24/17 1553 03/24/17 2122  HGB 9.2* 9.5* 8.8*  --   --   --   --  9.8*  --   --   HCT 31.0* 32.3* 30.0*  --   --   --   --  32.6*  --   --   PLT 133* 142* 151  --   --   --   --  226  --   --   LABPROT 25.1*  --   --  26.3*  --   --   --  19.2*  --   --   INR 2.23  --   --  2.37  --   --   --  1.60  --   --   HEPARINUNFRC 0.50  --   --  0.38  --   --   --   --   --  0.26*  CREATININE 1.20*  --   --   --  1.25*  --   --  1.16*  --   --   TROPONINI  --   --   --   --  0.07*  < > 0.20* 0.09* 0.07*  --   < > = values in this interval not displayed.  Estimated Creatinine Clearance: 49.1 mL/min (A) (by C-G formula based on SCr of 1.16 mg/dL (H)).   . dextrose 10 mL/hr at 03/24/17 0900  . heparin 1,750 Units/hr (03/24/17 1202)  . phenylephrine (NEO-SYNEPHRINE) Adult infusion 10 mcg/min (03/24/17 2000)    Assessment: 77 yoF on warfarin PTA for AFib initially held and bridged with heparin given need for procedures. INR trended back up and heparin was held, then resumed 8/16 with subtherapeutic INR s/p vitamin K.   Heparin held again 8/20 as INR trended back above 2.0 x2 days but now 1.60 s/p vitamin K.  Evening heparin level just below goal at 0.26, will make slight increase and recheck in the morning.  Goal of Therapy: Heparin level 0.3-0.7 units/ml Monitor platelets by anticoagulation protocol: Yes   Plan:  -Increase heparin to 1850 units/hr -Monitor daily INR, heparin  level  Erin Hearing PharmD., BCPS Clinical Pharmacist Pager [phone] 03/24/2017 9:48 PM

## 2017-03-24 NOTE — Progress Notes (Signed)
ANTICOAGULATION CONSULT NOTE - Follow Up Consult  Pharmacy Consult for Heparin  Indication: atrial fibrillation  No Known Allergies  Patient Measurements: Height: 6' (182.9 cm) Weight: 184 lb 15.5 oz (83.9 kg) IBW/kg (Calculated) : 73.1  Vital Signs: Temp: 98.6 F (37 C) (08/21 0830) Temp Source: Oral (08/21 0830) BP: 78/59 (08/21 1000) Pulse Rate: 26 (08/21 1000)  Labs:  Recent Labs  03/22/17 0500 03/22/17 2200 03/23/17 0410 03/23/17 0411  03/23/17 1052 03/23/17 1710 03/23/17 2344 03/24/17 0951  HGB 9.2* 9.5* 8.8*  --   --   --   --   --  9.8*  HCT 31.0* 32.3* 30.0*  --   --   --   --   --  32.6*  PLT 133* 142* 151  --   --   --   --   --  226  LABPROT 25.1*  --   --  26.3*  --   --   --   --  19.2*  INR 2.23  --   --  2.37  --   --   --   --  1.60  HEPARINUNFRC 0.50  --   --  0.38  --   --   --   --   --   CREATININE 1.20*  --   --   --   --  1.25*  --   --  1.16*  TROPONINI  --   --   --   --   < > 0.07* 0.06* 0.20* 0.09*  < > = values in this interval not displayed.  Estimated Creatinine Clearance: 49.1 mL/min (A) (by C-G formula based on SCr of 1.16 mg/dL (H)).   . dextrose 10 mL/hr at 03/24/17 0900  . phenylephrine (NEO-SYNEPHRINE) Adult infusion 15 mcg/min (03/24/17 0749)    Assessment: 35 yoF on warfarin PTA for AFib initially held and bridged with heparin given need for procedures. INR trended back up and heparin was held, then resumed 8/16 with subtherapeutic INR s/p vitamin K.   Heparin held again 8/20 as INR trended back above 2.0 x2 days but now 1.60 s/p vitamin K. Pt previously therapeutic on 1750 units/hr, will resume and check 8-hr heparin level.  Goal of Therapy: Heparin level 0.3-0.7 units/ml Monitor platelets by anticoagulation protocol: Yes   Plan:  -Hold heparin as INR >2 -Monitor daily INR  Arrie Senate, PharmD PGY-2 Cardiology Pharmacy Resident Pager: [phone] 03/24/2017

## 2017-03-24 NOTE — Evaluation (Signed)
Physical Therapy Evaluation Patient Details Name: Charlotte [name] MRN: [id number] DOB: [birth year] Today's Date: 03/24/2017   History of Present Illness  This 76 y.o. female admitted on 8/1 wiht limb ischemia and pain.  She underwent Rt femoral to tibial artery bypass 03/11/17.   She remained intubated 8/8- 8/17.  She developed uncontrolled A-FIb 8/19 and hypotension 8/20 requiring pressor support.  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   PMH includes:  A-Fib, polycythemia, PVD, CHF with chronic 02 therapy   Clinical Impression  Patient is s/p above surgery resulting in functional limitations due to the deficits listed below (see PT Problem List). Pt presents with generalized weakness, decreased activity tolerance, pain.  She is very deconditioned at this point, but is exceptionally motivated to regain independence.  She currently requires max A +2 for bed mobility and max A for EOB sitting x 20-25 mins. Patient will benefit from skilled PT to increase their independence and safety with mobility to allow discharge to the venue listed below.       Follow Up Recommendations CIR    Equipment Recommendations  None recommended by PT    Recommendations for Other Services Rehab consult     Precautions / Restrictions Precautions Precautions: Fall Precaution Comments: monitor BP  Restrictions Weight Bearing Restrictions: No      Mobility  Bed Mobility Overal bed mobility: Needs Assistance Bed Mobility: Sit to Supine;Supine to Sit     Supine to sit: Mod assist;+2 for physical assistance Sit to supine: Max assist;+2 for physical assistance   General bed mobility comments: assist for Rt LE, and to lift trunk  to move into sitting.  SHe required assist to control descent of trunk back into bed and assist for LEs   Transfers                 General transfer comment: unable to attempt this date      Balance Overall balance  assessment: Needs assistance Sitting-balance support: Feet supported;Bilateral upper extremity supported Sitting balance-Leahy Scale: Poor Sitting balance - Comments: Pt requird max A, overall, but was able to sit for brief periods ~30-45 seconds with min A.  She sat EOB x 20-25 mins working on trunk extension as well as UB and LB exercise and grooming  Postural control: Posterior lean                                   Pertinent Vitals/Pain Pain Assessment: Faces Faces Pain Scale: Hurts a little bit Pain Location: r leg, incision Pain Descriptors / Indicators: Grimacing Pain Intervention(s): Monitored during session;Limited activity within patient's tolerance    Home Living Family/patient expects to be discharged to:: Private residence Living Arrangements: Spouse/significant other;Children Available Help at Discharge: Family;Available 24 hours/day Type of Home: Mobile home Home Access: Stairs to enter Entrance Stairs-Rails: Right;Left Entrance Stairs-Number of Steps: 3 Home Layout: One level Home Equipment: Walker - 2 wheels;Bedside commode;Cane - single point Additional Comments: lives with spouse and son     Prior Function Level of Independence: Independent         Comments: Pt reports she was fully independent.  Drove.  She sponge bathes due to fall in tub ~5 years ago resulting in "broken hip_     Hand Dominance   Dominant Hand: Right    Extremity/Trunk Assessment   Upper Extremity Assessment Upper Extremity  Assessment: Generalized weakness    Lower Extremity Assessment Lower Extremity Assessment: RLE deficits/detail;LLE deficits/detail RLE Deficits / Details: R LE ROM and strength limited by tibial artery bypass, R hip and knee grossly 3-/5, unable to plantar/dorsiflex R ankle   RLE: Unable to fully assess due to pain RLE Sensation: history of peripheral neuropathy;decreased light touch LLE Deficits / Details: L LE strength grossly 3/5. ROM  WFL LLE Sensation: history of peripheral neuropathy    Cervical / Trunk Assessment Cervical / Trunk Assessment: Other exceptions Cervical / Trunk Exceptions: weakness noted   Communication   Communication: HOH  Cognition Arousal/Alertness: Awake/alert Behavior During Therapy: WFL for tasks assessed/performed Overall Cognitive Status: Within Functional Limits for tasks assessed                                        General Comments General comments (skin integrity, edema, etc.): BP 80s/60s with MAP >65    Exercises General Exercises - Upper Extremity Shoulder Flexion: AROM;Right;Left;5 reps;Seated General Exercises - Lower Extremity Long Arc Quad: 10 reps;Right;Left;AROM;AAROM;Seated Hip Flexion/Marching: AROM;Right;Left;10 reps;Seated   Assessment/Plan    PT Assessment Patient needs continued PT services  PT Problem List Decreased strength;Decreased range of motion;Decreased activity tolerance;Decreased balance;Decreased mobility;Cardiopulmonary status limiting activity;Impaired sensation;Pain       PT Treatment Interventions DME instruction;Gait training;Stair training;Functional mobility training;Therapeutic activities;Therapeutic exercise;Balance training;Patient/family education    PT Goals (Current goals can be found in the Care Plan section)  Acute Rehab PT Goals Patient Stated Goal: to regain strength  PT Goal Formulation: With patient Time For Goal Achievement: 04/07/17 Potential to Achieve Goals: Good    Frequency Min 3X/week   Barriers to discharge        Co-evaluation PT/OT/SLP Co-Evaluation/Treatment: Yes Reason for Co-Treatment: Complexity of the patient's impairments (multi-system involvement) PT goals addressed during session: Mobility/safety with mobility         AM-PAC PT "6 Clicks" Daily Activity  Outcome Measure Difficulty turning over in bed (including adjusting bedclothes, sheets and blankets)?: Unable Difficulty moving  from lying on back to sitting on the side of the bed? : Unable Difficulty sitting down on and standing up from a chair with arms (e.g., wheelchair, bedside commode, etc,.)?: Unable Help needed moving to and from a bed to chair (including a wheelchair)?: Total Help needed walking in hospital room?: Total Help needed climbing 3-5 steps with a railing? : Total 6 Click Score: 6    End of Session   Activity Tolerance: Patient tolerated treatment well Patient left: in bed;with call bell/phone within reach;with bed alarm set Nurse Communication: Mobility status PT Visit Diagnosis: Other abnormalities of gait and mobility (R26.89);Muscle weakness (generalized) (M62.81);History of falling (Z91.81);Difficulty in walking, not elsewhere classified (R26.2);Other symptoms and signs involving the nervous system (R29.898);Pain Pain - Right/Left: Right Pain - part of body: Leg    Time: 3790-2409 PT Time Calculation (min) (ACUTE ONLY): 35 min   Charges:   PT Evaluation $PT Eval High Complexity: 1 High     PT G Codes:        Darril Patriarca B. Migdalia Dk PT, DPT Acute Rehabilitation  [phone] Pager [phone]    [place] 03/24/2017, 6:20 PM

## 2017-03-24 NOTE — Evaluation (Signed)
Occupational Therapy Evaluation Patient Details Name: Charlotte [name] MRN: [id number] DOB: [birth year] Today's Date: 03/24/2017    History of Present Illness This 76 y.o. female admitted on 8/1 wiht limb ischemia and pain.  She underwent Rt femoral to tibial artery bypass 03/11/17.   She remained intubated 8/8- 8/17.  She developed uncontrolled A-FIb 8/19 and hypotension 8/20 requiring pressor support.  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   PMH includes:  A-Fib, polycythemia, PVD, CHF with chronic 02 therapy    Clinical Impression   Pt admitted with above. She demonstrates the below listed deficits and will benefit from continued OT to maximize safety and independence with BADLs.  Pt presents with generalized weakness, decreased activity tolerance, pain.  She is very deconditioned at this point, but is exceptionally motivated to regain independence.  She currently requires max - total A for ADLs and max A +2 for bed mobility and max A for EOB sitting x 20-25 mins.  She was fully independent PTA, and lives with son and spouse who can provide assist for her at discharge as needed.  Feel, she eventually will be a great candidate for CIR (once activity tolerance improves).  Will follow acutely.       Follow Up Recommendations  CIR;Supervision/Assistance - 24 hour    Equipment Recommendations  Wheelchair (measurements OT)    Recommendations for Other Services Rehab consult     Precautions / Restrictions Precautions Precautions: Fall Precaution Comments: monitor BP  Restrictions Weight Bearing Restrictions: No      Mobility Bed Mobility Overal bed mobility: Needs Assistance Bed Mobility: Sit to Supine;Supine to Sit     Supine to sit: Mod assist;+2 for physical assistance Sit to supine: Max assist;+2 for physical assistance   General bed mobility comments: assist for Rt LE, and to lift trunk  to move into sitting.  SHe  required assist to control descent of trunk back into bed and assist for LEs   Transfers                 General transfer comment: unable to attempt this date     Balance Overall balance assessment: Needs assistance Sitting-balance support: Feet supported;Bilateral upper extremity supported Sitting balance-Leahy Scale: Poor Sitting balance - Comments: Pt requird max A, overall, but was able to sit for brief periods ~30-45 seconds with min A.  She sat EOB x 20-25 mins working on trunk extension as well as UB and LB exercise and grooming  Postural control: Posterior lean                                 ADL either performed or assessed with clinical judgement   ADL Overall ADL's : Needs assistance/impaired Eating/Feeding: Set up;Bed level   Grooming: Wash/dry hands;Wash/dry face;Oral care;Brushing hair;Sitting;Moderate assistance   Upper Body Bathing: Moderate assistance;Sitting   Lower Body Bathing: Maximal assistance;Bed level   Upper Body Dressing : Maximal assistance;Sitting   Lower Body Dressing: Total assistance;Bed level   Toilet Transfer: Total assistance Toilet Transfer Details (indicate cue type and reason): unable to safely attempt this date  Toileting- Clothing Manipulation and Hygiene: Total assistance;Bed level       Functional mobility during ADLs: Moderate assistance;+2 for physical assistance;Maximal assistance (bed mobility )       Vision         Perception  Praxis      Pertinent Vitals/Pain Pain Assessment: Faces Faces Pain Scale: Hurts a little bit Pain Location: r leg, incision Pain Descriptors / Indicators: Grimacing Pain Intervention(s): Monitored during session;Repositioned     Hand Dominance Right   Extremity/Trunk Assessment Upper Extremity Assessment Upper Extremity Assessment: Generalized weakness   Lower Extremity Assessment Lower Extremity Assessment: Defer to PT evaluation   Cervical / Trunk  Assessment Cervical / Trunk Assessment: Other exceptions Cervical / Trunk Exceptions: weakness noted    Communication Communication Communication: HOH   Cognition Arousal/Alertness: Awake/alert Behavior During Therapy: WFL for tasks assessed/performed Overall Cognitive Status: Within Functional Limits for tasks assessed                                     General Comments  BP 80s/60s with MAP >65    Exercises Exercises: General Upper Extremity;General Lower Extremity General Exercises - Upper Extremity Shoulder Flexion: AROM;Right;Left;5 reps;Seated General Exercises - Lower Extremity Long Arc Quad: 10 reps;Right;Left;AROM;AAROM;Seated Hip Flexion/Marching: AROM;Right;Left;10 reps;Seated   Shoulder Instructions      Home Living Family/patient expects to be discharged to:: Private residence Living Arrangements: Spouse/significant other;Children Available Help at Discharge: Family;Available 24 hours/day Type of Home: Mobile home Home Access: Stairs to enter Entrance Stairs-Number of Steps: 3 Entrance Stairs-Rails: Right;Left Home Layout: One level         Biochemist, clinical: Standard     Home Equipment: Environmental consultant - 2 wheels;Bedside commode;Cane - single point   Additional Comments: lives with spouse and son       Prior Functioning/Environment Level of Independence: Independent        Comments: Pt reports she was fully independent.  Drove.  She sponge bathes due to fall in tub ~5 years ago resulting in "broken hip_        OT Problem List: Decreased strength;Decreased activity tolerance;Decreased range of motion;Impaired balance (sitting and/or standing);Decreased knowledge of use of DME or AE;Cardiopulmonary status limiting activity;Pain      OT Treatment/Interventions: Self-care/ADL training;Therapeutic exercise;Energy conservation;DME and/or AE instruction;Therapeutic activities;Patient/family education;Balance training    OT Goals(Current goals  can be found in the care plan section) Acute Rehab OT Goals Patient Stated Goal: to regain strength  OT Goal Formulation: With patient Time For Goal Achievement: 04/07/17 Potential to Achieve Goals: Good ADL Goals Pt Will Perform Eating: Independently;sitting Pt Will Perform Grooming: sitting;with set-up Pt Will Perform Upper Body Bathing: with set-up;with supervision;sitting Pt Will Perform Lower Body Bathing: with min assist;sit to/from stand;sitting/lateral leans Pt Will Transfer to Toilet: with mod assist;squat pivot transfer;bedside commode  OT Frequency: Min 2X/week   Barriers to D/C:            Co-evaluation              AM-PAC PT "6 Clicks" Daily Activity     Outcome Measure Help from another person eating meals?: A Little Help from another person taking care of personal grooming?: A Little Help from another person toileting, which includes using toliet, bedpan, or urinal?: Total Help from another person bathing (including washing, rinsing, drying)?: A Lot Help from another person to put on and taking off regular upper body clothing?: A Lot Help from another person to put on and taking off regular lower body clothing?: Total 6 Click Score: 12   End of Session Equipment Utilized During Treatment: Oxygen Nurse Communication: Mobility status  Activity Tolerance: Patient tolerated treatment well Patient left:  in bed;with call bell/phone within reach  OT Visit Diagnosis: Muscle weakness (generalized) (M62.81)                Time: 2257-5051 OT Time Calculation (min): 36 min Charges:  OT General Charges $OT Visit: 1 Procedure OT Evaluation $OT Eval Moderate Complexity: 1 Procedure G-Codes:     Lucille Passy, OTR/L [phone]   [name] 03/24/2017, 4:39 PM

## 2017-03-24 NOTE — Progress Notes (Signed)
  Echocardiogram 2D Echocardiogram has been performed.  [name] 03/24/2017, 2:07 PM

## 2017-03-25 ENCOUNTER — Inpatient Hospital Stay (HOSPITAL_COMMUNITY): Payer: Medicare Other

## 2017-03-25 LAB — GLUCOSE, CAPILLARY
GLUCOSE-CAPILLARY: 81 mg/dL (ref 65–99)
GLUCOSE-CAPILLARY: 68 mg/dL (ref 65–99)
GLUCOSE-CAPILLARY: 79 mg/dL (ref 65–99)
GLUCOSE-CAPILLARY: 77 mg/dL (ref 65–99)
Glucose-Capillary: 66 mg/dL (ref 65–99)
Glucose-Capillary: 93 mg/dL (ref 65–99)
Glucose-Capillary: 73 mg/dL (ref 65–99)

## 2017-03-25 LAB — HEMOGLOBIN AND HEMATOCRIT, BLOOD
HEMATOCRIT: 30.3 % — AB (ref 36.0–46.0)
Hemoglobin: 9.1 g/dL — ABNORMAL LOW (ref 12.0–15.0)

## 2017-03-25 LAB — BASIC METABOLIC PANEL
Anion gap: 8 (ref 5–15)
BUN: 27 mg/dL — AB (ref 6–20)
CO2: 23 mmol/L (ref 22–32)
CREATININE: 1.16 mg/dL — AB (ref 0.44–1.00)
Calcium: 8.1 mg/dL — ABNORMAL LOW (ref 8.9–10.3)
Chloride: 105 mmol/L (ref 101–111)
GFR calc Af Amer: 52 mL/min — ABNORMAL LOW (ref >60)
GFR, EST NON AFRICAN AMERICAN: 45 mL/min — AB (ref >60)
Glucose, Bld: 79 mg/dL (ref 65–99)
Potassium: 3.9 mmol/L (ref 3.5–5.1)
SODIUM: 136 mmol/L (ref 135–145)

## 2017-03-25 LAB — RENAL FUNCTION PANEL
Albumin: 1.9 g/dL — ABNORMAL LOW (ref 3.5–5.0)
Anion gap: 6 (ref 5–15)
BUN: 27 mg/dL — AB (ref 6–20)
CHLORIDE: 106 mmol/L (ref 101–111)
CO2: 24 mmol/L (ref 22–32)
Calcium: 7.8 mg/dL — ABNORMAL LOW (ref 8.9–10.3)
Creatinine, Ser: 1.05 mg/dL — ABNORMAL HIGH (ref 0.44–1.00)
GFR, EST AFRICAN AMERICAN: 59 mL/min — AB (ref >60)
GFR, EST NON AFRICAN AMERICAN: 51 mL/min — AB (ref >60)
Glucose, Bld: 69 mg/dL (ref 65–99)
POTASSIUM: 3.8 mmol/L (ref 3.5–5.1)
Phosphorus: 3.3 mg/dL (ref 2.5–4.6)
Sodium: 136 mmol/L (ref 135–145)

## 2017-03-25 LAB — TYPE AND SCREEN
ABO/RH(D): A POS
Antibody Screen: NEGATIVE

## 2017-03-25 LAB — PROTIME-INR
INR: 1.62
PROTHROMBIN TIME: 19.4 s — AB (ref 11.4–15.2)

## 2017-03-25 LAB — CBC
HCT: 29.6 % — ABNORMAL LOW (ref 36.0–46.0)
Hemoglobin: 8.8 g/dL — ABNORMAL LOW (ref 12.0–15.0)
MCH: 23.3 pg — ABNORMAL LOW (ref 26.0–34.0)
MCHC: 29.7 g/dL — ABNORMAL LOW (ref 30.0–36.0)
MCV: 78.5 fL (ref 78.0–100.0)
PLATELETS: 250 10*3/uL (ref 150–400)
RBC: 3.77 MIL/uL — ABNORMAL LOW (ref 3.87–5.11)
RDW: 28.5 % — AB (ref 11.5–15.5)
WBC: 44.1 10*3/uL — AB (ref 4.0–10.5)

## 2017-03-25 LAB — HEPARIN LEVEL (UNFRACTIONATED): HEPARIN UNFRACTIONATED: 0.3 [IU]/mL (ref 0.30–0.70)

## 2017-03-25 LAB — MAGNESIUM: MAGNESIUM: 1.7 mg/dL (ref 1.7–2.4)

## 2017-03-25 MED ORDER — MIDODRINE HCL 5 MG PO TABS
5.0000 mg | ORAL_TABLET | Freq: Three times a day (TID) | ORAL | Status: DC
Start: 2017-03-25 — End: 2017-03-29
  Administered 2017-03-25 – 2017-03-29 (×13): 5 mg via ORAL
  Filled 2017-03-25 (×14): qty 1

## 2017-03-25 MED ORDER — FUROSEMIDE 10 MG/ML IJ SOLN
20.0000 mg | Freq: Four times a day (QID) | INTRAMUSCULAR | Status: AC
  Administered 2017-03-25 (×3): 20 mg via INTRAVENOUS
  Filled 2017-03-25 (×3): qty 2

## 2017-03-25 NOTE — Progress Notes (Addendum)
[facility]  Subjective  - Comfortable and pleasant.   Objective (!) 85/64 92 (!) 97.4 F (36.3 C) (Oral) (!) 26 93%  Intake/Output Summary (Last 24 hours) at 03/25/17 0955 Last data filed at 03/25/17 0600  Gross per 24 hour  Intake          1673.45 ml  Output              555 ml  Net          1118.45 ml    Doppler DP/PT right  Adaptic guaze covering blistered skin No motor function right ankle or toes Incisions healing well No sign of infection at blister sites, maceration of lateral foot, left open to air for 1 hour.   Assessment/Planning: 76 y.o. female is s/p: right femoral to tibial artery bypass with in situ great saphenous vein 14 Days Post-Op   Bypass patent. Will allow foot to continue to demarcate 4th and 5th toes may not survive. Does not appear infected.  Continue xeroform to skin blisters.  Still requiring vasopressors Neo at 10 mcg Heparin IV    Laurence Slate [facility] 03/25/2017 9:55 AM --  Laboratory Lab Results:  Recent Labs  03/24/17 0951 03/25/17 0507  WBC 39.1* 44.1*  HGB 9.8* 8.8*  HCT 32.6* 29.6*  PLT 226 250   BMET  Recent Labs  03/24/17 0951 03/25/17 0507  NA 135 136  K 3.7 3.8  CL 105 106  CO2 24 24  GLUCOSE 98 69  BUN 30* 27*  CREATININE 1.16* 1.05*  CALCIUM 8.1* 7.8*    COAG Lab Results  Component Value Date   INR 1.62 03/25/2017   INR 1.60 03/24/2017   INR 2.37 03/23/2017   No results found for: PTT   I have independently interviewed and examined patient and agree with PA assessment and plan above. Discussed likelihood of needing below knee amputation at some point given non-functional status but would prefer to wait until she has no further pressor requirement. If ultimately the right leg is the driving process then will need to consider sooner.   Rhylee Nunn C. [name], MD [facility] [place] Office: [phone] Pager: [phone]

## 2017-03-25 NOTE — Progress Notes (Signed)
PULMONARY / CRITICAL CARE MEDICINE   Name: Charlotte [name] MRN: [id number] DOB: [birth year]    ADMISSION DATE:  03/04/2017   CONSULTATION DATE:  03/11/2017  REFERRING MD:  Windell Moulding  CHIEF COMPLAINT:  Leg pain  BRIEF HPI:  76 y.o.   female with known atrial fibrillation, polycythemia, and peripheral vascular disease. Patient also has known congestive heart failure and is on chronic oxygen therapy. Admitted on 8/1 with limb ischemia and subsequently pain. Underwent bypass on 8/8 and return to the intensive care unit intubated.  SUBJECTIVE:  No acute events overnight. Patient reports pain in her right leg. No relief of pain with IV morphine. Denies any chest discomfort but is having some mild increased dyspnea. Patient is somewhat altered this morning. Denies nausea.  REVIEW OF SYSTEMS:  Unable to obtain with altered mentation.  VITAL SIGNS: BP (!) 85/64   Pulse 92   Temp (!) 97.3 F (36.3 C) (Oral)   Resp (!) 26   Ht 6' (1.829 m)   Wt 184 lb 15.5 oz (83.9 kg)   SpO2 93%   BMI 25.09 kg/m   HEMODYNAMICS: CVP:  [8 mmHg-14 mmHg] 14 mmHg  VENTILATOR SETTINGS:    INTAKE / OUTPUT: I/O last 3 completed shifts: In: 3113.6 [P.O.:840; I.V.:2273.6] Out: 1355 [Urine:1355]  PHYSICAL EXAMINATION:  General:  Family at bedside. Awake. No distress. Integument:  Warm. Dry. Extremities:  Cyanosis and ischemia within right lower extremity, particularly the toes. HEENT: No scleral icterus. Moist mucous membranes. Cardiovascular:  Regular rate with irregular rhythm. Atrial fibrillation on telemetry. Bilateral lower extremity edema. Pulmonary:  Coarse breath sounds bilaterally, particularly in the bases. Mildly increased work of breathing on nasal cannula oxygen. Abdomen: Soft. Normal bowel sounds. Nondistended. Nontender. Neurological: Answers questions but somewhat delirious. No meningismus.  LABS:  BMET  Recent Labs Lab 03/23/17 1052 03/24/17 0951 03/25/17 0507  NA 136 135  136  K 3.9 3.7 3.8  CL 106 105 106  CO2 22 24 24   BUN 36* 30* 27*  CREATININE 1.25* 1.16* 1.05*  GLUCOSE 109* 98 69    Electrolytes  Recent Labs Lab 03/23/17 0410 03/23/17 1052 03/24/17 0951 03/25/17 0507  CALCIUM  --  8.0* 8.1* 7.8*  MG 1.8  --  1.7 1.7  PHOS 3.2  --  3.3 3.3    CBC  Recent Labs Lab 03/23/17 0410 03/24/17 0951 03/25/17 0507  WBC 23.4* 39.1* 44.1*  HGB 8.8* 9.8* 8.8*  HCT 30.0* 32.6* 29.6*  PLT 151 226 250    Coag's  Recent Labs Lab 03/23/17 0411 03/24/17 0951 03/25/17 0507  INR 2.37 1.60 1.62    Sepsis Markers  Recent Labs Lab 03/22/17 1041 03/23/17 0410 03/24/17 0951  LATICACIDVEN  --  1.1  --   PROCALCITON 0.23 0.41 0.22    ABG No results for input(s): PHART, PCO2ART, PO2ART in the last 168 hours.  Liver Enzymes  Recent Labs Lab 03/19/17 0422 03/20/17 0432 03/23/17 1052 03/24/17 0951 03/25/17 0507  AST 50* 74* 24  --   --   ALT 134* 85* 38  --   --   ALKPHOS 67 73 64  --   --   BILITOT 1.6* 1.9* 1.5*  --   --   ALBUMIN 2.1* 1.9* 2.1* 2.1* 1.9*    Cardiac Enzymes  Recent Labs Lab 03/24/17 0951 03/24/17 1553 03/24/17 2123  TROPONINI 0.09* 0.07* 0.08*    Glucose  Recent Labs Lab 03/24/17 0829 03/24/17 1200 03/24/17 1630 03/24/17 1940 03/24/17 2352  03/25/17 0412  GLUCAP 91 88 105* 92 97 81    Imaging Dg Chest Port 1 View  Result Date: 03/15/2017 CLINICAL DATA:  Acute respiratory failure. EXAM: PORTABLE CHEST 1 VIEW COMPARISON:  One-view chest x-ray the one-view chest x-ray 03/14/2017 FINDINGS: Endotracheal tube is stable, 5 cm above the carina. Side port of the NG tube is just above the GE junction and could be advanced for more optimal positioning. A left subclavian line is stable. Cardiac enlargement, mild edema, and bilateral effusions are again noted. Bibasilar airspace disease is present, likely reflecting atelectasis. There is no significant interval change. IMPRESSION: 1. Stable cardiomegaly,  mild edema, and pleural effusions compatible with congestive heart failure. 2. Support apparatus is stable. 3. The side port of the NG tube is likely just above the GE junction and could be advanced 6-10 cm for more optimal positioning. Electronically Signed   By: San Morelle M.D.   On: 03/15/2017 07:25     IMAGING/STUDIES: PFT 08/30/13: FVC 3.57 L (95%) FEV1 2.38 L (83%) FEV1/FVC 0.66 FEF 25-75 1.34 L (60%) negative bronchodilator response TLC 101% RV 105% DLCO uncorrected 34% RENAL U/S 8/6:  IMPRESSION: 1. No evidence of hydronephrosis. 2. Mildly increased renal parenchymal echogenicity may reflect medical renal disease. TTE 8/10:LV normal in size with EF 65-70%. No regional wall motion abnormalities. Unable to assess diastolic function due to atrial fibrillation. LA &RA severely dilated. RV moderately dilated with moderate to severely reduced systolic function. No aortic stenosis or regurgitation. Aortic root normal in size. Mild mitral regurgitation without stenosis. Ventricular septum with diastolic flattening and systolic flattening. Moderate tricuspid regurgitation. No pulmonic stenosis or regurgitation. Trivial pericardial effusion. PORT CXR 8/11: Previously reviewed by me. Endotracheal tube and central catheter in good position. Enteric feeding tube coursing low diaphragm. Hazy bilateral lower lung opacification consistent with pleural effusions. PORT ABD X-RAY 8/12: IMPRESSION: 1. Nasogastric tube tip in the proximal stomach and side hole in the region of the gastroesophageal junction. 2. Curvilinear density in the inferior pelvis in the expected position of the urinary bladder, most likely representing a Foley catheter. A rectal tube is also a possibility. 3. Cardiomegaly and bilateral pleural effusions. PORT CXR 8/12: Pereviously reviewed by me. Persistent bilateral lower lung hazy opacification consistent with pleural effusion. Endotracheal tube approximately 2 cm too  high. Enteric feeding tube with side-port at gastroesophageal junction. RUQ U/S 8/13: IMPRESSION: 1. Biliary sludge and small gallstones within the gallbladder lumen. 2. No evidence of biliary obstruction. 3. Liver parenchyma mildly heterogeneous without evidence of significant steatosis or cirrhosis. A small amount of ascites is present as well as a right pleural effusion. PORT CXR 8/14: Previously reviewed by me. Endotracheal tube and central venous catheter in good position. Enteric feeding tube coursing low diaphragm. Continue to hazy bilateral lower lung opacities consistent with pleural effusions. CT ABD/PELVIS W/O CONTRAST 8/14: IMPRESSION: 1. No explanation for bilious vomiting. No bowel obstruction. 2. Findings consistent with fluid overload with body wall edema, third-spacing, small amount of abdominopelvic ascites and moderate to large bilateral pleural effusions. 3. Cortical nephrocalcinosis. 4. Gallstones/sludge. 5. Advanced aortic atherosclerosis. PORT CXR 8/19:  Previously reviewed by me. Persistent hazy bilateral lower lung opacities. Left subclavian central venous catheter in good position. TTE 8/21:  LV normal in size and EF 60-65%. Normal wall motion without regional wall abnormality. Indeterminant diastolic function due to atrial fibrillation. LA & RA severely dilated. RV moderately dilated with moderate reduction in systolic function. No aortic stenosis or regurgitation. Aortic root normal  in size. Mild mitral regurgitation without stenosis. Trivial pulmonic regurgitation. Mild tricuspid regurgitation. No pericardial effusion. Left sided pleural effusion noted.  MICROBIOLOGY: MRSA PCR 8/3: Negative  Tracheal Aspirate Culture 8/8: Klebsiella pneumoniae Tracheal Aspirate Culture 8/9: Klebsiella pneumoniae  ANTIBIOTICS: Cefuroxime 8/8 (periop) Cefepime 8/9 - 8/11 Rocephin 8/11 - 8/12 Cipro 8/13 - 8/19 Vancomycin 8/9 - 8/10; Vancomycin 8/13 - 8/21  SIGNIFICANT  EVENTS: 08/01 - Admit 08/08 - RLE Fem-Pop bypass & returned to ICU intubated 08/09 - On Neo-Synephrine w/ oliguria & increasing oxygen needs 08/12 - N/V overnight >> tube feedings on hold 08/13 - Changed from Rocephin to Cipro w/ increasing LFTs 08/15 - Vitamin K 1mg  IV for INR >4.0 & Amiodarone drip transitioned to 200mg  via tube daily 08/19 - Transitioned back to Amiodarone drip w/ uncontrolled A fib 08/20 - Returned to ICU w/ hypotension overnight.  08/21 - Improving vasopressor requirement. Transitioned from Amiodarone drip to 200mg  BID.  LINES/TUBES: R RADIAL ART LINE 8/8 (out) OETT 8/8 - 8/17 OGT 8/8 - 8/17 L Snead CVL 8/9 >>> Foley >>> PIV  ASSESSMENT / PLAN:  PULMONARY A: Acute on chronic hypoxic respiratory failure: Improved. Multifactorial from pulmonary edema and pneumonia. Bilateral pleural effusions: Likely secondary to diastolic congestive heart failure. Deferring thoracentesis for now.    P:   Continuing incentive spirometry for pulmonary toilette Weaning FiO2 Gentle Diuresis with lasix today   CARDIOVASCULAR A:  Shock: Multifactorial. Slowly improving. Atrial fibrillation Acute diastolic congestive heart failure Limb Ischemia: Status post bypass. Continued evidence of ischemia and right lower extremity.  P:  Continuous telemetry monitoring Continuing home Lasix in the setting of borderline hypotension Continuing amiodarone by mouth twice a day Vascular surgery following & managing post bypass Continuing to wean Neo-Synephrine for MAP >65 Lasix IV q6hr x3 doses today Starting Midodrine 5mg  po TID with meals  RENAL A:   Hypokalemia: Resolved. Hypomagnesemia: Resolved. Acute on chronic renal failure: Resolved.   P:   Continuing to trend electrolytes and renal function daily.  Monitoring urine output.  Replacing electrolytes as indicated Repeat BMP at 5pm today  GASTROINTESTINAL A:   Transaminitis: Likely secondary to shock liver. Normalized on  8/20. Nausea with vomiting: Resolved. Malnutrition with anasarca  P:   Continuing diet optimization  HEMATOLOGIC A:   Coagulopathy: Likely secondary to malnutrition and antibiotic therapy. Responds substantially with vitamin K 1 mg IV. Polycythemia Vera Leukocytosis: Worsening. Suspect secondary to limb ischemia. Anemia: Hemoglobin fluctuating. No evidence of active bleeding. History of DVT  P:  Continuing heparin drip per pharmacy protocol Trending cell counts daily with CBC Trending INR daily  Transfuse per ICU guidelines Repeat Hgb/Hct @ 5pm today  INFECTIOUS A:   Sepsis Klebsiella pneumonia Ischemic right foot/cellulitis  P:   Monitoring off antibiotics Plan to repeat culture for fever  ENDOCRINE A:   Hypoglycemia:  Improved on D10 infusion. Hypothyroidism  P:   Continue synthroid  Weaned off D10 infusion as oral endotracheal improves  NEUROLOGIC A:   Delirium: Likely secondary to pain medication. Mild. Pain control postoperatively  P:   Tylenol prn Morphine prn Avoiding sedatives  Prophylaxis:  Protonix PO daily & heparin drip per protocol. Diet:  Dysphagia 3 Diet as tolerated. Code Status:  Full Code per previous physician discussions. Disposition:  Continuing to wean pressor requirement in the ICU. Family Update: Patient , husband, and son updated at the time of my exam.   DISCUSSION:  76 y.o. female with right lower extremity ischemia. Worsening leukocytosis is likely due  to limb ischemia. Delirium is likely due to IV narcotics. Hypoglycemia resolved with D10 infusion. Pressor requirement steadily improving.  I have personally spent a total of 32 minutes of critical care time today caring for the patient, updating the family at bedside, & reviewing the patient's electronic medical record.  Sonia Baller Ashok Cordia, M.D. [facility] Pager:  [phone] After 3pm or if no response, call [phone] 03/25/2017 8:27  AM

## 2017-03-25 NOTE — Progress Notes (Signed)
Rehab Admissions Coordinator Note:  Patient was screened by Retta Diones for appropriateness for an Inpatient Acute Rehab Consult.  At this time, we are recommending Inpatient Rehab consult.  Retta Diones 03/25/2017, 8:36 AM  I can be reached at [phone].

## 2017-03-25 NOTE — Progress Notes (Addendum)
Nutrition Follow-up  DOCUMENTATION CODES:   Not applicable  INTERVENTION:  - Will d/c Boost Breeze ordered TID d/t need for thickened liquids.  - Will order Magic Cup TID with meals, each supplement provides 290 kcal and 9 grams of protein - Continue to encourage PO intakes of meals and supplement. - RD will monitor for additional nutrition-related needs, including need for education on diet texture prior to d/c.  NUTRITION DIAGNOSIS:   Increased nutrient needs related to acute illness as evidenced by estimated needs. -ongoing  GOAL:   Patient will meet greater than or equal to 90% of their needs -unmet at this time  MONITOR:   PO intake, Supplement acceptance, Weight trends, Labs, Skin  ASSESSMENT:   Pt with PMH of Afib, PVD, CHF on home O2 admitted with R limb ischemia on 8/1, s/p fem to tp trunk bypass 8/8.  8/22 Diet advanced from NPO to Dysphagia 3, honey-thick on 8/18 at ~12:20 PM. Doc flow sheet reviewed and the following intakes determined from documentation there and meal orders in [facility]. On 8/19 she consumed ~755 kcal and 23 grams of protein for breakfast; 8/20 consumed a daily total of ~635 kcal and 23 grams of protein; 8/21 consumed a daily total of ~950 kcal and 30 grams of protein. Pt has been consuming POs without difficulty or issue and appetite is improving gradually. Per notes, inpatient rehab is recommended. Weight fluctuations since admission. Current weight -11 lbs/4.9 kg from assessment 8/9 and +30 lbs/13.8 kg from 8/3.  Medications reviewed; 20 mg IV Lasix QID, sliding scale Novolog, 75 mcg oral Synthroid/day, 20 mg oral Protonix/day. Labs reviewed; BUN: 27 mg/dL, creatinine: 1.05 mg/dL, Ca: 7.8 mg/dL, GFR: 51 mL/min.  IVF: D10 @ 10 mL/hr (82 kcal from dextrose).   8/17 - Pt extubated and OGT removed this AM. - TF (Vital AF 1.2) discontinued. - Not medically ready for bedside swallow evaluation.   8/9 - Pt remains intubated after surgery 8/8  for acute respiratory failure, acute pulmonary edema and bilateral pleural effusions.   - She remains at high risk for AKA.  - Per CCM pt with persistent/worsening shock changing to norepinephrine stopping phenylephrine - Meal Completion: 25-50% during admission - TF: Vital High Protein @ 40 ml/hr with 30 ml Prostat BID (1160 kcal, 114 grams protein, and 803 mL free water). - Change TF: Vital AF 1.2 @ 40 ml/hr (960 ml/day) and increase Prostat to 30 ml TID (1452 kcal, 117 grams protein, and 778 ml free water).   MV: 6.9 L/min Temp (24hrs), Avg:98.4 F (36.9 C), Min:97.5 F (36.4 C), Max:99.2 F (37.3 C) MAP: [phone]  Nutrition-Focused physical exam completed. Findings are no fat depletion, mild/moderate muscle depletion of temples, and moderate edema.     Diet Order:  DIET DYS 3 Room service appropriate? Yes with Assist; Fluid consistency: Honey Thick  Skin:   (R groin and leg incisions) on 8/8, Stage 2 R ankle and R calf pressure injuries, MASD to sacrum  Last BM:  8/20  Height:   Ht Readings from Last 1 Encounters:  03/21/17 6' (1.829 m)    Weight:   Wt Readings from Last 1 Encounters:  03/23/17 184 lb 15.5 oz (83.9 kg)    Ideal Body Weight:  72.7 kg  BMI:  Body mass index is 25.09 kg/m.  Estimated Nutritional Needs:   Kcal:  1800-2000  Protein:  105-115 gm  Fluid:  1.8-2.0 L/day  EDUCATION NEEDS:   No education needs identified at this time  Jarome Matin, MS, RD, LDN, [facility] Inpatient Clinical Dietitian Pager # [phone] After hours/weekend pager # [phone]

## 2017-03-25 NOTE — Progress Notes (Addendum)
ANTICOAGULATION CONSULT NOTE - Follow Up Consult  Pharmacy Consult for Heparin  Indication: atrial fibrillation  No Known Allergies  Patient Measurements: Height: 6' (182.9 cm) Weight: 184 lb 15.5 oz (83.9 kg) IBW/kg (Calculated) : 73.1  Vital Signs: Temp: 97.3 F (36.3 C) (08/22 0412) Temp Source: Oral (08/22 0412) BP: 102/60 (08/22 0700) Pulse Rate: 88 (08/22 0700)  Labs:  Recent Labs  03/23/17 0410 03/23/17 0411 03/23/17 1052  03/24/17 0951 03/24/17 1553 03/24/17 2122 03/24/17 2123 03/25/17 0507  HGB 8.8*  --   --   --  9.8*  --   --   --  8.8*  HCT 30.0*  --   --   --  32.6*  --   --   --  29.6*  PLT 151  --   --   --  226  --   --   --  250  LABPROT  --  26.3*  --   --  19.2*  --   --   --  19.4*  INR  --  2.37  --   --  1.60  --   --   --  1.62  HEPARINUNFRC  --  0.38  --   --   --   --  0.26*  --  0.30  CREATININE  --   --  1.25*  --  1.16*  --   --   --  1.05*  TROPONINI  --   --  0.07*  < > 0.09* 0.07*  --  0.08*  --   < > = values in this interval not displayed.  Estimated Creatinine Clearance: 54.2 mL/min (A) (by C-G formula based on SCr of 1.05 mg/dL (H)).   . dextrose 10 mL/hr at 03/24/17 0900  . heparin 1,850 Units/hr (03/25/17 0200)  . phenylephrine (NEO-SYNEPHRINE) Adult infusion 10 mcg/min (03/25/17 0200)    Assessment: 76 yoF on warfarin PTA for AFib initially held and bridged with heparin given need for procedures. INR trended back up and heparin was held, then resumed 8/16 with subtherapeutic INR s/p vitamin K.   Heparin held again 8/20 as INR trended back above 2.0 x2 days but now subtherapeutic s/p vitamin K. Heparin level therapeutic this morning but at low end of goal at 0.30, will increase slightly and recheck with am labs, CBC remains stable.  Goal of Therapy: Heparin level 0.3-0.7 units/ml Monitor platelets by anticoagulation protocol: Yes   Plan:  -Increase heparin to 1900 units/hr -Monitor daily INR, heparin level, CBC  Arrie Senate, PharmD PGY-2 Cardiology Pharmacy Resident Pager: [phone] 03/25/2017

## 2017-03-26 LAB — RENAL FUNCTION PANEL
Albumin: 2 g/dL — ABNORMAL LOW (ref 3.5–5.0)
Anion gap: 5 (ref 5–15)
BUN: 27 mg/dL — AB (ref 6–20)
CHLORIDE: 105 mmol/L (ref 101–111)
CO2: 24 mmol/L (ref 22–32)
Calcium: 7.8 mg/dL — ABNORMAL LOW (ref 8.9–10.3)
Creatinine, Ser: 1.15 mg/dL — ABNORMAL HIGH (ref 0.44–1.00)
GFR calc Af Amer: 53 mL/min — ABNORMAL LOW (ref >60)
GFR, EST NON AFRICAN AMERICAN: 46 mL/min — AB (ref >60)
Glucose, Bld: 75 mg/dL (ref 65–99)
POTASSIUM: 3.6 mmol/L (ref 3.5–5.1)
Phosphorus: 3.6 mg/dL (ref 2.5–4.6)
Sodium: 134 mmol/L — ABNORMAL LOW (ref 135–145)

## 2017-03-26 LAB — PROTIME-INR
INR: 1.6
PROTHROMBIN TIME: 19.2 s — AB (ref 11.4–15.2)

## 2017-03-26 LAB — CBC
HEMATOCRIT: 30.3 % — AB (ref 36.0–46.0)
HEMOGLOBIN: 9.1 g/dL — AB (ref 12.0–15.0)
MCH: 23.6 pg — ABNORMAL LOW (ref 26.0–34.0)
MCHC: 30 g/dL (ref 30.0–36.0)
MCV: 78.5 fL (ref 78.0–100.0)
Platelets: 326 10*3/uL (ref 150–400)
RBC: 3.86 MIL/uL — ABNORMAL LOW (ref 3.87–5.11)
RDW: 28.6 % — AB (ref 11.5–15.5)
WBC: 49.2 10*3/uL — AB (ref 4.0–10.5)

## 2017-03-26 LAB — BASIC METABOLIC PANEL
ANION GAP: 8 (ref 5–15)
BUN: 26 mg/dL — ABNORMAL HIGH (ref 6–20)
CALCIUM: 8.2 mg/dL — AB (ref 8.9–10.3)
CO2: 24 mmol/L (ref 22–32)
Chloride: 103 mmol/L (ref 101–111)
Creatinine, Ser: 1.18 mg/dL — ABNORMAL HIGH (ref 0.44–1.00)
GFR, EST AFRICAN AMERICAN: 51 mL/min — AB (ref >60)
GFR, EST NON AFRICAN AMERICAN: 44 mL/min — AB (ref >60)
Glucose, Bld: 84 mg/dL (ref 65–99)
Potassium: 4 mmol/L (ref 3.5–5.1)
SODIUM: 135 mmol/L (ref 135–145)

## 2017-03-26 LAB — MAGNESIUM
MAGNESIUM: 1.8 mg/dL (ref 1.7–2.4)
Magnesium: 1.7 mg/dL (ref 1.7–2.4)

## 2017-03-26 LAB — CORTISOL: CORTISOL PLASMA: 90.3 ug/dL

## 2017-03-26 LAB — GLUCOSE, CAPILLARY
GLUCOSE-CAPILLARY: 104 mg/dL — AB (ref 65–99)
GLUCOSE-CAPILLARY: 68 mg/dL (ref 65–99)
GLUCOSE-CAPILLARY: 77 mg/dL (ref 65–99)
GLUCOSE-CAPILLARY: 80 mg/dL (ref 65–99)
GLUCOSE-CAPILLARY: 84 mg/dL (ref 65–99)
Glucose-Capillary: 82 mg/dL (ref 65–99)
Glucose-Capillary: 61 mg/dL — ABNORMAL LOW (ref 65–99)
Glucose-Capillary: 73 mg/dL (ref 65–99)
Glucose-Capillary: 116 mg/dL — ABNORMAL HIGH (ref 65–99)

## 2017-03-26 LAB — HEPARIN LEVEL (UNFRACTIONATED): HEPARIN UNFRACTIONATED: 0.51 [IU]/mL (ref 0.30–0.70)

## 2017-03-26 LAB — T4, FREE: FREE T4: 1.29 ng/dL — AB (ref 0.61–1.12)

## 2017-03-26 MED ORDER — MAGNESIUM SULFATE 2 GM/50ML IV SOLN
2.0000 g | Freq: Once | INTRAVENOUS | Status: AC
  Administered 2017-03-26: 2 g via INTRAVENOUS
  Filled 2017-03-26: qty 50

## 2017-03-26 MED ORDER — CHLORHEXIDINE GLUCONATE CLOTH 2 % EX PADS
6.0000 | MEDICATED_PAD | Freq: Every day | CUTANEOUS | Status: DC
  Administered 2017-03-27 – 2017-03-28 (×2): 6 via TOPICAL

## 2017-03-26 MED ORDER — HYDROCORTISONE NA SUCCINATE PF 100 MG IJ SOLR
50.0000 mg | Freq: Four times a day (QID) | INTRAMUSCULAR | Status: DC
  Administered 2017-03-26 – 2017-03-29 (×12): 50 mg via INTRAVENOUS
  Filled 2017-03-26 (×12): qty 2

## 2017-03-26 MED ORDER — ORAL CARE MOUTH RINSE
15.0000 mL | Freq: Two times a day (BID) | OROMUCOSAL | Status: DC
  Administered 2017-03-26 – 2017-03-28 (×4): 15 mL via OROMUCOSAL

## 2017-03-26 MED ORDER — POTASSIUM CHLORIDE 10 MEQ/50ML IV SOLN
10.0000 meq | INTRAVENOUS | Status: AC
  Administered 2017-03-26 (×2): 10 meq via INTRAVENOUS
  Filled 2017-03-26: qty 50

## 2017-03-26 MED ORDER — FUROSEMIDE 10 MG/ML IJ SOLN
8.0000 mg/h | INTRAVENOUS | Status: DC
  Administered 2017-03-26 – 2017-03-29 (×3): 8 mg/h via INTRAVENOUS
  Filled 2017-03-26: qty 21
  Filled 2017-03-26 (×4): qty 25

## 2017-03-26 MED ORDER — LEVOTHYROXINE SODIUM 100 MCG IV SOLR
37.5000 ug | Freq: Every day | INTRAVENOUS | Status: DC
  Administered 2017-03-27 – 2017-03-29 (×3): 37.5 ug via INTRAVENOUS
  Filled 2017-03-26 (×3): qty 5

## 2017-03-26 MED ORDER — GABAPENTIN 250 MG/5ML PO SOLN
100.0000 mg | Freq: Three times a day (TID) | ORAL | Status: DC
  Administered 2017-03-26 – 2017-03-29 (×9): 100 mg via ORAL
  Filled 2017-03-26 (×13): qty 2

## 2017-03-26 NOTE — Progress Notes (Signed)
  Progress Note    03/26/2017 8:13 AM 15 Days Post-Op  Subjective:  Feeling better overall  Vitals:   03/26/17 0700 03/26/17 0800  BP: 95/63 98/68  Pulse: 88 89  Resp: (!) 24 (!) 23  Temp:    SpO2: 96% 94%    Physical Exam: Awake and alert Non labored respirations this a.m. Right foot is unchanged, motor is weak. Sensation in tact to ankle Strong right pt,dp signals Stable right foot gangrenous changes Incisions are all intact with stables, right groin is sutured, no erythema  CBC    Component Value Date/Time   WBC 49.2 (H) 03/26/2017 0219   RBC 3.86 (L) 03/26/2017 0219   HGB 9.1 (L) 03/26/2017 0219   HCT 30.3 (L) 03/26/2017 0219   PLT 326 03/26/2017 0219   MCV 78.5 03/26/2017 0219   MCH 23.6 (L) 03/26/2017 0219   MCHC 30.0 03/26/2017 0219   RDW 28.6 (H) 03/26/2017 0219   LYMPHSABS 1.1 03/19/2017 0422   MONOABS 2.4 (H) 03/19/2017 0422   EOSABS 0.5 03/19/2017 0422   BASOSABS 0.0 03/19/2017 0422    BMET    Component Value Date/Time   NA 134 (L) 03/26/2017 0219   K 3.6 03/26/2017 0219   CL 105 03/26/2017 0219   CO2 24 03/26/2017 0219   GLUCOSE 75 03/26/2017 0219   BUN 27 (H) 03/26/2017 0219   CREATININE 1.15 (H) 03/26/2017 0219   CREATININE 1.21 (H) 08/14/2016 0733   CALCIUM 7.8 (L) 03/26/2017 0219   GFRNONAA 46 (L) 03/26/2017 0219   GFRAA 53 (L) 03/26/2017 0219    INR    Component Value Date/Time   INR 1.60 03/26/2017 0218     Intake/Output Summary (Last 24 hours) at 03/26/17 0813 Last data filed at 03/26/17 0800  Gross per 24 hour  Intake           1204.5 ml  Output             1125 ml  Net             79.5 ml     Assessment:  76 y.o. female is s/p right fem-tp trunk bypass with vein  Plan: Continue to allow right foot to demarcate. Does not appear grossly infected although there is dead tissue evident on all toes, lateral toes will need amputation at some point.    Armie Moren C. [name], MD [facility]  [place] Office: [phone] Pager: [phone]  03/26/2017 8:13 AM

## 2017-03-26 NOTE — Progress Notes (Signed)
Occupational Therapy Treatment Patient Details Name: Charlotte [name] MRN: [id number] DOB: 14-Jun-1941 Today's Date: 03/26/2017    History of present illness This 76 y.o. female admitted on 8/1 wiht limb ischemia and pain.  She underwent Rt femoral to tibial artery bypass 03/11/17.   She remained intubated 8/8- 8/17.  She developed uncontrolled A-FIb 8/19 and hypotension 8/20 requiring pressor support.  Pt with gangrene toes Rt foot - likely will require BKA or toe amputations in future- allowing foot to demarcate.   PMH includes:  A-Fib, polycythemia, PVD, CHF with chronic 02 therapy    OT comments  This 76 yo female admitted and underwent above presents to acute OT with making progress with sitting balance and overall mobility as precursors to basic ADLs. She will continue to benefit from acute OT with follow up OT on CIR to work towards a S level.  Follow Up Recommendations  CIR;Supervision/Assistance - 24 hour    Equipment Recommendations  Wheelchair cushion (measurements OT);Wheelchair (measurements OT)    Recommendations for Other Services Rehab consult    Precautions / Restrictions Precautions Precautions: Fall Precaution Comments: BP stable today Restrictions Weight Bearing Restrictions: No       Mobility Bed Mobility Overal bed mobility: Needs Assistance Bed Mobility: Rolling;Supine to Sit Rolling: Mod assist;+2 for physical assistance   Supine to sit: Mod assist;+2 for physical assistance     General bed mobility comments: minimal A for legs and also A to come up at trunk. VCs for sequencing  Transfers Overall transfer level: Needs assistance   Transfers: Sit to/from Stand;Squat Pivot Transfers     Squat pivot transfers: Max assist;+2 physical assistance     General transfer comment: partial sit<>stand Max A (could not get fully upright); with squat pivot to pt's left--pt unable to move RLE by herself    Balance Overall balance assessment: Needs  assistance Sitting-balance support: Feet supported;Single extremity supported Sitting balance-Leahy Scale: Fair Sitting balance - Comments: Able to sit EOB ~10 minutes with alternating UE support   Standing balance support: Bilateral upper extremity supported Standing balance-Leahy Scale: Poor Standing balance comment: Needs Bil UE support with attempt at standing                           ADL either performed or assessed with clinical judgement   ADL Overall ADL's : Needs assistance/impaired                         Toilet Transfer: Maximal assistance;+2 for physical assistance;Stand-pivot Toilet Transfer Details (indicate cue type and reason): going to pt's left with decreased ability to move RLE                 Vision Patient Visual Report: No change from baseline            Cognition Arousal/Alertness: Awake/alert Behavior During Therapy: WFL for tasks assessed/performed Overall Cognitive Status: Within Functional Limits for tasks assessed                                                     Pertinent Vitals/ Pain       Pain Assessment: No/denies pain Faces Pain Scale: Hurts little more Pain Intervention(s): Monitored during session         Frequency  Min 2X/week        Progress Toward Goals  OT Goals(current goals can now be found in the care plan section)  Progress towards OT goals: Progressing toward goals     Plan Discharge plan remains appropriate    Co-evaluation    PT/OT/SLP Co-Evaluation/Treatment: Yes Reason for Co-Treatment: Complexity of the patient's impairments (multi-system involvement)   OT goals addressed during session: Strengthening/ROM      AM-PAC PT "6 Clicks" Daily Activity     Outcome Measure   Help from another person eating meals?: A Little Help from another person taking care of personal grooming?: A Little Help from another person toileting, which includes using toliet,  bedpan, or urinal?: Total Help from another person bathing (including washing, rinsing, drying)?: A Lot Help from another person to put on and taking off regular upper body clothing?: A Lot Help from another person to put on and taking off regular lower body clothing?: Total 6 Click Score: 12    End of Session Equipment Utilized During Treatment: Gait belt  OT Visit Diagnosis: Unsteadiness on feet (R26.81);Other abnormalities of gait and mobility (R26.89);Muscle weakness (generalized) (M62.81)   Activity Tolerance Patient tolerated treatment well   Patient Left in chair;with call bell/phone within reach;with nursing/sitter in room   Nurse Communication Mobility status (did leave lift pad under pt incase it is needed for back to bed)        Time: 2536-6440 OT Time Calculation (min): 42 min  Charges: OT General Charges $OT Visit: 1 Procedure OT Treatments $Therapeutic Activity: 23-37 mins  [address], OTR/L [phone] 03/26/2017

## 2017-03-26 NOTE — Progress Notes (Signed)
  Speech Language Pathology Treatment: Dysphagia  Patient Details Name: Charlotte [name] MRN: [id number] DOB: 27-May-1941 Today's Date: 03/26/2017 Time: 8177-1165 SLP Time Calculation (min) (ACUTE ONLY): 15 min  Assessment / Plan / Recommendation Clinical Impression  Family reported she seems to be "doing good with her swallowing." Consumed honey thick juice devoid of observable difficulty. CXR yesterday increasing changes of CHF with enlarging pleural effusions left greater than right without mention of opacity or infiltrate. No audible chest congestion noted. Educated pt and family of plan for probable repeat FEES tomorrow for possible return to thin liquids or thinner that than honey thick.    HPI HPI: 76 year old female admitted 03/04/17 with right leg pain and discoloration, found to have pneumonia, septic shock. PMh significant for AFib, diastolic heart failure, HTN, HOH, PVD, polycysthemia. Pt underwent R fem to tp trunk bypass 03/11/17. She was intubated from 8/8-8/17. Chest x-ray 8/18 shows Lung opacities consistent with a combination of bilateral pleural effusions with pulmonary edema, atelectasis or a combination. Perihilar lung opacity appears mildly increased from the most recent prior study suggesting worsened pulmonary edema.      SLP Plan  Continue with current plan of care       Recommendations  Diet recommendations: Dysphagia 3 (mechanical soft);Honey-thick liquid Liquids provided via: Cup Medication Administration: Whole meds with puree Supervision: Patient able to self feed;Full supervision/cueing for compensatory strategies Compensations: Slow rate;Small sips/bites;Follow solids with liquid Postural Changes and/or Swallow Maneuvers: Seated upright 90 degrees                Oral Care Recommendations: Oral care BID Follow up Recommendations: Inpatient Rehab SLP Visit Diagnosis: Dysphagia, pharyngeal phase (R13.13) Plan: Continue with current plan of care        GO                Houston Siren 03/26/2017, 10:15 AM  Orbie Pyo Colvin Caroli.Ed [employer] [phone]

## 2017-03-26 NOTE — Progress Notes (Signed)
ANTICOAGULATION CONSULT NOTE - Follow Up Consult  Pharmacy Consult for Heparin  Indication: atrial fibrillation  No Known Allergies  Patient Measurements: Height: 6' (182.9 cm) Weight: 184 lb 15.5 oz (83.9 kg) IBW/kg (Calculated) : 73.1  Vital Signs: Temp: 97.4 F (36.3 C) (08/23 0300) Temp Source: Oral (08/23 0300) BP: 98/68 (08/23 0800) Pulse Rate: 89 (08/23 0800)  Labs:  Recent Labs  03/24/17 0951 03/24/17 1553 03/24/17 2122 03/24/17 2123 03/25/17 0507 03/25/17 1723 03/26/17 0201 03/26/17 0218 03/26/17 0219  HGB 9.8*  --   --   --  8.8* 9.1*  --   --  9.1*  HCT 32.6*  --   --   --  29.6* 30.3*  --   --  30.3*  PLT 226  --   --   --  250  --   --   --  326  LABPROT 19.2*  --   --   --  19.4*  --   --  19.2*  --   INR 1.60  --   --   --  1.62  --   --  1.60  --   HEPARINUNFRC  --   --  0.26*  --  0.30  --  0.51  --   --   CREATININE 1.16*  --   --   --  1.05* 1.16*  --   --  1.15*  TROPONINI 0.09* 0.07*  --  0.08*  --   --   --   --   --     Estimated Creatinine Clearance: 49.5 mL/min (A) (by C-G formula based on SCr of 1.15 mg/dL (H)).   . dextrose 10 mL/hr at 03/26/17 0800  . furosemide (LASIX) infusion    . heparin 1,900 Units/hr (03/26/17 0800)  . magnesium sulfate 1 - 4 g bolus IVPB    . potassium chloride      Assessment: 76 yoF on warfarin PTA for AFib initially held and bridged with heparin given need for procedures. INR trended back up and heparin was held, then resumed 8/16 with subtherapeutic INR s/p vitamin K. Heparin held again 8/20 as INR trended back above 2.0 x2 days but now subtherapeutic s/p vitamin K and heparin resumed.   Heparin level therapeutic this morning at 0.51, INR remains below 2.0, CBC stable.  Goal of Therapy: Heparin level 0.3-0.7 units/ml Monitor platelets by anticoagulation protocol: Yes   Plan:  -Continue heparin at 1900 units/hr -Monitor daily INR, heparin level, CBC  Arrie Senate, PharmD PGY-2 Cardiology  Pharmacy Resident Pager: [phone] 03/26/2017

## 2017-03-26 NOTE — Progress Notes (Signed)
Physical Therapy Treatment Patient Details Name: Charlotte [name] MRN: [id number] DOB: 10/25/1940 Today's Date: 03/26/2017    History of Present Illness This 76 y.o. female admitted on 8/1 wiht limb ischemia and pain.  She underwent Rt femoral to tibial artery bypass 03/11/17.   She remained intubated 8/8- 8/17.  She developed uncontrolled A-FIb 8/19 and hypotension 8/20 requiring pressor support.  Pt with gangrene toes Rt foot - likely will require BKA or toe amputations in future- allowing foot to demarcate.   PMH includes:  A-Fib, polycythemia, PVD, CHF with chronic 02 therapy     PT Comments    Patient is making progress toward mobility goals. Pt tolerated OOB transfer with mod/max A +2 and no c/o pain. Current plan remains appropriate.    Follow Up Recommendations  CIR     Equipment Recommendations  None recommended by PT    Recommendations for Other Services Rehab consult     Precautions / Restrictions Precautions Precautions: Fall Precaution Comments: BP stable today Restrictions Weight Bearing Restrictions: No    Mobility  Bed Mobility Overal bed mobility: Needs Assistance Bed Mobility: Rolling;Supine to Sit Rolling: Mod assist;+2 for physical assistance   Supine to sit: Mod assist;+2 for physical assistance     General bed mobility comments: assist to bring bilat LE from EOB and elevate trunk into sitting; cues for sequencing and technique; use of rails when rolling and to maintain sidelying for pericare prior to sitting EOB  Transfers Overall transfer level: Needs assistance   Transfers: Sit to/from Stand;Squat Pivot Transfers     Squat pivot transfers: Max assist;+2 physical assistance     General transfer comment: cues for technique and foot placement prior to stand; stood X2 from EOB and second trial used chuck pad to aid in lifting hips for pivot and for balance; partial sit<>stand Max A (could not get fully upright); with squat pivot to pt's left--pt  unable to move RLE by herself  Ambulation/Gait                 Stairs            Wheelchair Mobility    Modified Rankin (Stroke Patients Only)       Balance Overall balance assessment: Needs assistance Sitting-balance support: Feet supported;Single extremity supported Sitting balance-Leahy Scale: Fair Sitting balance - Comments: Able to sit EOB ~10 minutes with alternating UE support   Standing balance support: Bilateral upper extremity supported Standing balance-Leahy Scale: Poor Standing balance comment: Needs Bil UE support with attempt at standing                            Cognition Arousal/Alertness: Awake/alert Behavior During Therapy: WFL for tasks assessed/performed Overall Cognitive Status: Within Functional Limits for tasks assessed                                        Exercises      General Comments        Pertinent Vitals/Pain Pain Assessment: No/denies pain Faces Pain Scale: Hurts little more Pain Intervention(s): Monitored during session    Home Living                      Prior Function            PT Goals (current goals can now be found in  the care plan section) Progress towards PT goals: Progressing toward goals    Frequency    Min 3X/week      PT Plan Current plan remains appropriate    Co-evaluation PT/OT/SLP Co-Evaluation/Treatment: Yes Reason for Co-Treatment: Complexity of the patient's impairments (multi-system involvement);For patient/therapist safety PT goals addressed during session: Mobility/safety with mobility OT goals addressed during session: Strengthening/ROM      AM-PAC PT "6 Clicks" Daily Activity  Outcome Measure  Difficulty turning over in bed (including adjusting bedclothes, sheets and blankets)?: Unable Difficulty moving from lying on back to sitting on the side of the bed? : Unable Difficulty sitting down on and standing up from a chair with arms (e.g.,  wheelchair, bedside commode, etc,.)?: Unable Help needed moving to and from a bed to chair (including a wheelchair)?: A Lot Help needed walking in hospital room?: Total Help needed climbing 3-5 steps with a railing? : Total 6 Click Score: 7    End of Session Equipment Utilized During Treatment: Gait belt;Oxygen Activity Tolerance: Patient tolerated treatment well Patient left: in chair;with call bell/phone within reach;with chair alarm set;with nursing/sitter in room Nurse Communication: Mobility status PT Visit Diagnosis: Other abnormalities of gait and mobility (R26.89);Muscle weakness (generalized) (M62.81);History of falling (Z91.81);Difficulty in walking, not elsewhere classified (R26.2);Other symptoms and signs involving the nervous system (R29.898);Pain Pain - Right/Left: Right Pain - part of body: Leg     Time: 0093-8182 PT Time Calculation (min) (ACUTE ONLY): 42 min  Charges:  $Therapeutic Activity: 8-22 mins                    G Codes:       Earney Navy, PTA Pager: [phone]     Darliss Cheney 03/26/2017, 1:40 PM

## 2017-03-26 NOTE — Progress Notes (Signed)
PULMONARY / CRITICAL CARE MEDICINE   Name: Charlotte [name] MRN: [id number] DOB: January 03, 1941    ADMISSION DATE:  03/04/2017   CONSULTATION DATE:  03/11/2017  REFERRING MD:  Windell Moulding  CHIEF COMPLAINT:  Leg pain  BRIEF HPI:  76 y.o.   female with known atrial fibrillation, polycythemia, and peripheral vascular disease. Patient also has known congestive heart failure and is on chronic oxygen therapy. Admitted on 8/1 with limb ischemia and subsequently pain. Underwent bypass on 8/8 and return to the intensive care unit intubated.  SUBJECTIVE:  No pressors, no distress, BP good   VITAL SIGNS: BP 98/68   Pulse 89   Temp (!) 97.4 F (36.3 C) (Oral)   Resp (!) 23   Ht 6' (1.829 m)   Wt 83.9 kg (184 lb 15.5 oz)   SpO2 94%   BMI 25.09 kg/m   HEMODYNAMICS:    VENTILATOR SETTINGS:    INTAKE / OUTPUT: I/O last 3 completed shifts: In: 1835.1 [P.O.:720; I.V.:1115.1] Out: 1500 [Urine:1500]  PHYSICAL EXAMINATION:  General: awakem in chair position, no distress Neuro: nonfocal, weak, perrl HEENT: jvd wnl or low PULM: coarse CV:  s1 s2  rrr not tachy GI: soft, sb wnl nor  Extremities: edema   LABS:  BMET  Recent Labs Lab 03/25/17 0507 03/25/17 1723 03/26/17 0219  NA 136 136 134*  K 3.8 3.9 3.6  CL 106 105 105  CO2 24 23 24   BUN 27* 27* 27*  CREATININE 1.05* 1.16* 1.15*  GLUCOSE 69 79 75    Electrolytes  Recent Labs Lab 03/24/17 0951 03/25/17 0507 03/25/17 1723 03/26/17 0218 03/26/17 0219  CALCIUM 8.1* 7.8* 8.1*  --  7.8*  MG 1.7 1.7  --  1.7  --   PHOS 3.3 3.3  --   --  3.6    CBC  Recent Labs Lab 03/24/17 0951 03/25/17 0507 03/25/17 1723 03/26/17 0219  WBC 39.1* 44.1*  --  49.2*  HGB 9.8* 8.8* 9.1* 9.1*  HCT 32.6* 29.6* 30.3* 30.3*  PLT 226 250  --  326    Coag's  Recent Labs Lab 03/24/17 0951 03/25/17 0507 03/26/17 0218  INR 1.60 1.62 1.60    Sepsis Markers  Recent Labs Lab 03/22/17 1041 03/23/17 0410 03/24/17 0951   LATICACIDVEN  --  1.1  --   PROCALCITON 0.23 0.41 0.22    ABG No results for input(s): PHART, PCO2ART, PO2ART in the last 168 hours.  Liver Enzymes  Recent Labs Lab 03/20/17 0432 03/23/17 1052 03/24/17 0951 03/25/17 0507 03/26/17 0219  AST 74* 24  --   --   --   ALT 85* 38  --   --   --   ALKPHOS 73 64  --   --   --   BILITOT 1.9* 1.5*  --   --   --   ALBUMIN 1.9* 2.1* 2.1* 1.9* 2.0*    Cardiac Enzymes  Recent Labs Lab 03/24/17 0951 03/24/17 1553 03/24/17 2123  TROPONINI 0.09* 0.07* 0.08*    Glucose  Recent Labs Lab 03/25/17 1302 03/25/17 1555 03/25/17 2055 03/25/17 2356 03/26/17 0354 03/26/17 0847  GLUCAP 79 77 73 104* 82 68    Imaging Dg Chest Port 1 View  Result Date: 03/15/2017 CLINICAL DATA:  Acute respiratory failure. EXAM: PORTABLE CHEST 1 VIEW COMPARISON:  One-view chest x-ray the one-view chest x-ray 03/14/2017 FINDINGS: Endotracheal tube is stable, 5 cm above the carina. Side port of the NG tube is just above the GE  junction and could be advanced for more optimal positioning. A left subclavian line is stable. Cardiac enlargement, mild edema, and bilateral effusions are again noted. Bibasilar airspace disease is present, likely reflecting atelectasis. There is no significant interval change. IMPRESSION: 1. Stable cardiomegaly, mild edema, and pleural effusions compatible with congestive heart failure. 2. Support apparatus is stable. 3. The side port of the NG tube is likely just above the GE junction and could be advanced 6-10 cm for more optimal positioning. Electronically Signed   By: San Morelle M.D.   On: 03/15/2017 07:25     IMAGING/STUDIES: PFT 08/30/13: FVC 3.57 L (95%) FEV1 2.38 L (83%) FEV1/FVC 0.66 FEF 25-75 1.34 L (60%) negative bronchodilator response TLC 101% RV 105% DLCO uncorrected 34% RENAL U/S 8/6:  IMPRESSION: 1. No evidence of hydronephrosis. 2. Mildly increased renal parenchymal echogenicity may reflect medical renal  disease. TTE 8/10:LV normal in size with EF 65-70%. No regional wall motion abnormalities. Unable to assess diastolic function due to atrial fibrillation. LA &RA severely dilated. RV moderately dilated with moderate to severely reduced systolic function. No aortic stenosis or regurgitation. Aortic root normal in size. Mild mitral regurgitation without stenosis. Ventricular septum with diastolic flattening and systolic flattening. Moderate tricuspid regurgitation. No pulmonic stenosis or regurgitation. Trivial pericardial effusion. PORT CXR 8/11: Previously reviewed by me. Endotracheal tube and central catheter in good position. Enteric feeding tube coursing low diaphragm. Hazy bilateral lower lung opacification consistent with pleural effusions. PORT ABD X-RAY 8/12: IMPRESSION: 1. Nasogastric tube tip in the proximal stomach and side hole in the region of the gastroesophageal junction. 2. Curvilinear density in the inferior pelvis in the expected position of the urinary bladder, most likely representing a Foley catheter. A rectal tube is also a possibility. 3. Cardiomegaly and bilateral pleural effusions. PORT CXR 8/12: Pereviously reviewed by me. Persistent bilateral lower lung hazy opacification consistent with pleural effusion. Endotracheal tube approximately 2 cm too high. Enteric feeding tube with side-port at gastroesophageal junction. RUQ U/S 8/13: IMPRESSION: 1. Biliary sludge and small gallstones within the gallbladder lumen. 2. No evidence of biliary obstruction. 3. Liver parenchyma mildly heterogeneous without evidence of significant steatosis or cirrhosis. A small amount of ascites is present as well as a right pleural effusion. PORT CXR 8/14: Previously reviewed by me. Endotracheal tube and central venous catheter in good position. Enteric feeding tube coursing low diaphragm. Continue to hazy bilateral lower lung opacities consistent with pleural effusions. CT ABD/PELVIS W/O  CONTRAST 8/14: IMPRESSION: 1. No explanation for bilious vomiting. No bowel obstruction. 2. Findings consistent with fluid overload with body wall edema, third-spacing, small amount of abdominopelvic ascites and moderate to large bilateral pleural effusions. 3. Cortical nephrocalcinosis. 4. Gallstones/sludge. 5. Advanced aortic atherosclerosis. PORT CXR 8/19:  Previously reviewed by me. Persistent hazy bilateral lower lung opacities. Left subclavian central venous catheter in good position. TTE 8/21:  LV normal in size and EF 60-65%. Normal wall motion without regional wall abnormality. Indeterminant diastolic function due to atrial fibrillation. LA & RA severely dilated. RV moderately dilated with moderate reduction in systolic function. No aortic stenosis or regurgitation. Aortic root normal in size. Mild mitral regurgitation without stenosis. Trivial pulmonic regurgitation. Mild tricuspid regurgitation. No pericardial effusion. Left sided pleural effusion noted.  MICROBIOLOGY: MRSA PCR 8/3: Negative  Tracheal Aspirate Culture 8/8: Klebsiella pneumoniae Tracheal Aspirate Culture 8/9: Klebsiella pneumoniae  ANTIBIOTICS: Cefuroxime 8/8 (periop) Cefepime 8/9 - 8/11 Rocephin 8/11 - 8/12 Cipro 8/13 - 8/19 Vancomycin 8/9 - 8/10; Vancomycin 8/13 - 8/21  SIGNIFICANT EVENTS: 08/01 - Admit 08/08 - RLE Fem-Pop bypass & returned to ICU intubated 08/09 - On Neo-Synephrine w/ oliguria & increasing oxygen needs 08/12 - N/V overnight >> tube feedings on hold 08/13 - Changed from Rocephin to Cipro w/ increasing LFTs 08/15 - Vitamin K 1mg  IV for INR >4.0 & Amiodarone drip transitioned to 200mg  via tube daily 08/19 - Transitioned back to Amiodarone drip w/ uncontrolled A fib 08/20 - Returned to ICU w/ hypotension overnight.  08/21 - Improving vasopressor requirement. Transitioned from Amiodarone drip to 200mg  BID.  LINES/TUBES: R RADIAL ART LINE 8/8 (out) OETT 8/8 - 8/17 OGT 8/8 - 8/17 L  [place] CVL 8/9 >>> Foley >>> PIV  ASSESSMENT / PLAN:  PULMONARY A: Acute on chronic hypoxic respiratory failure: Improved. Multifactorial from pulmonary edema and pneumonia. Bilateral pleural effusions: Likely secondary to diastolic congestive heart failure. Deferring thoracentesis for now.    P:   Currently without distress and  Low  O2 needs- if this changes will need thora Neg balance as able, as BP tolerates  CARDIOVASCULAR A:  Shock: Multifactorial. Slowly improving. Atrial fibrillation Acute diastolic congestive heart failure Limb Ischemia: Status post bypass. Continued evidence of ischemia and right lower extremity.  P:  Continuous telemetry monitoring Assess cortisol with up and down BP Continuing amiodarone by mouth twice a day Continuing to wean Neo-Synephrine for MAP >60 Lasix infusion may be more tolerable for neg balance goals and BP issues Midodrine 5mg  po TID with meals  RENAL A:   Hypokalemia: Resolved. Hypomagnesemia: Resolved. Acute on chronic renal failure: Resolved.   P:   Cq12h bmet Lasix infusion K supp kvo limit fluid bolus  GASTROINTESTINAL A:   Transaminitis: Likely secondary to shock liver. Normalized on 8/20. Nausea with vomiting: Resolved. Malnutrition with anasarca dysphagia   P:   Continuing diet optimization May need cdiff testing, follow wbc Dysphagia diet  HEMATOLOGIC A:   Coagulopathy: Likely secondary to malnutrition and antibiotic therapy. Responds substantially with vitamin K 1 mg IV. Polycythemia Vera Leukocytosis: Worsening. Suspect secondary to limb ischemia. Anemia: Hemoglobin fluctuating. No evidence of active bleeding. History of DVT hemoconcentration 8/23 P:  Heparin Cbc with diff in am   INFECTIOUS A:   Sepsis Klebsiella pneumonia Ischemic right foot/cellulitis  P:   May need cdiff testing, follow output and fever curve and wbc May need repeat CT chest UA  ENDOCRINE A:   Hypoglycemia:  Improved on  D10 infusion. Hypothyroidism TSH 17 R/o AI P:   Continue synthroid  Weaned off D10 infusion as oral endotracheal improves hypoglcyemia and hypotension -add empiric steroids , obtain cortisol prior TSh noted, send t3 to assess sick euthyroid consider synthroid to IV ( steroids to start)\ For now will keep same synthroid dose as IV , may need to escalate after we assess T3  NEUROLOGIC A:   Delirium: Likely secondary to pain medication. Mild. Pain control postoperatively  P:   Tylenol prn Morphine prn Avoiding sedatives PT as able   Lavon Paganini. Titus Mould, MD, [place] Pgr: [facility]]\

## 2017-03-27 ENCOUNTER — Inpatient Hospital Stay (HOSPITAL_COMMUNITY): Payer: Medicare Other

## 2017-03-27 DIAGNOSIS — R0902 Hypoxemia: Secondary | ICD-10-CM

## 2017-03-27 DIAGNOSIS — R5381 Other malaise: Secondary | ICD-10-CM

## 2017-03-27 DIAGNOSIS — R1110 Vomiting, unspecified: Secondary | ICD-10-CM

## 2017-03-27 DIAGNOSIS — R740 Nonspecific elevation of levels of transaminase and lactic acid dehydrogenase [LDH]: Secondary | ICD-10-CM

## 2017-03-27 DIAGNOSIS — Z95828 Presence of other vascular implants and grafts: Secondary | ICD-10-CM

## 2017-03-27 LAB — BASIC METABOLIC PANEL
ANION GAP: 11 (ref 5–15)
ANION GAP: 11 (ref 5–15)
ANION GAP: 9 (ref 5–15)
BUN: 28 mg/dL — ABNORMAL HIGH (ref 6–20)
BUN: 28 mg/dL — ABNORMAL HIGH (ref 6–20)
BUN: 30 mg/dL — AB (ref 6–20)
CALCIUM: 7.9 mg/dL — AB (ref 8.9–10.3)
CALCIUM: 8.2 mg/dL — AB (ref 8.9–10.3)
CALCIUM: 8.4 mg/dL — AB (ref 8.9–10.3)
CO2: 22 mmol/L (ref 22–32)
CO2: 22 mmol/L (ref 22–32)
CO2: 23 mmol/L (ref 22–32)
CREATININE: 1.24 mg/dL — AB (ref 0.44–1.00)
Chloride: 101 mmol/L (ref 101–111)
Chloride: 102 mmol/L (ref 101–111)
Chloride: 102 mmol/L (ref 101–111)
Creatinine, Ser: 1.24 mg/dL — ABNORMAL HIGH (ref 0.44–1.00)
Creatinine, Ser: 1.32 mg/dL — ABNORMAL HIGH (ref 0.44–1.00)
GFR calc Af Amer: 45 mL/min — ABNORMAL LOW (ref >60)
GFR calc Af Amer: 48 mL/min — ABNORMAL LOW (ref >60)
GFR calc non Af Amer: 42 mL/min — ABNORMAL LOW (ref >60)
GFR, EST AFRICAN AMERICAN: 48 mL/min — AB (ref >60)
GFR, EST NON AFRICAN AMERICAN: 42 mL/min — AB (ref >60)
GFR, EST NON AFRICAN AMERICAN: 39 mL/min — AB (ref >60)
GLUCOSE: 112 mg/dL — AB (ref 65–99)
GLUCOSE: 113 mg/dL — AB (ref 65–99)
GLUCOSE: 99 mg/dL (ref 65–99)
POTASSIUM: 3.4 mmol/L — AB (ref 3.5–5.1)
POTASSIUM: 3.3 mmol/L — AB (ref 3.5–5.1)
Potassium: 3.8 mmol/L (ref 3.5–5.1)
SODIUM: 136 mmol/L (ref 135–145)
SODIUM: 135 mmol/L (ref 135–145)
Sodium: 132 mmol/L — ABNORMAL LOW (ref 135–145)

## 2017-03-27 LAB — GLUCOSE, CAPILLARY
GLUCOSE-CAPILLARY: 111 mg/dL — AB (ref 65–99)
Glucose-Capillary: 117 mg/dL — ABNORMAL HIGH (ref 65–99)
Glucose-Capillary: 117 mg/dL — ABNORMAL HIGH (ref 65–99)
Glucose-Capillary: 104 mg/dL — ABNORMAL HIGH (ref 65–99)

## 2017-03-27 LAB — CBC WITH DIFFERENTIAL/PLATELET
Basophils Absolute: 0 10*3/uL (ref 0.0–0.1)
Basophils Relative: 0 %
EOS PCT: 0 %
Eosinophils Absolute: 0 10*3/uL (ref 0.0–0.7)
HEMATOCRIT: 30.3 % — AB (ref 36.0–46.0)
HEMOGLOBIN: 9.5 g/dL — AB (ref 12.0–15.0)
LYMPHS ABS: 1.2 10*3/uL (ref 0.7–4.0)
Lymphocytes Relative: 2 %
MCH: 24.7 pg — ABNORMAL LOW (ref 26.0–34.0)
MCHC: 31.4 g/dL (ref 30.0–36.0)
MCV: 78.9 fL (ref 78.0–100.0)
MONOS PCT: 1 %
Monocytes Absolute: 0.6 10*3/uL (ref 0.1–1.0)
Neutro Abs: 59.6 10*3/uL — ABNORMAL HIGH (ref 1.7–7.7)
Neutrophils Relative %: 97 %
Platelets: 480 10*3/uL — ABNORMAL HIGH (ref 150–400)
RBC: 3.84 MIL/uL — AB (ref 3.87–5.11)
RDW: 29.4 % — ABNORMAL HIGH (ref 11.5–15.5)
WBC: 61.4 10*3/uL — AB (ref 4.0–10.5)

## 2017-03-27 LAB — MAGNESIUM
MAGNESIUM: 1.8 mg/dL (ref 1.7–2.4)
Magnesium: 1.9 mg/dL (ref 1.7–2.4)
Magnesium: 2 mg/dL (ref 1.7–2.4)

## 2017-03-27 LAB — HEPARIN LEVEL (UNFRACTIONATED): Heparin Unfractionated: 0.54 IU/mL (ref 0.30–0.70)

## 2017-03-27 LAB — T3: T3 TOTAL: 60 ng/dL — AB (ref 71–180)

## 2017-03-27 LAB — PROTIME-INR
INR: 1.66
Prothrombin Time: 19.8 seconds — ABNORMAL HIGH (ref 11.4–15.2)

## 2017-03-27 LAB — CK: CK TOTAL: 39 U/L (ref 38–234)

## 2017-03-27 MED ORDER — PIPERACILLIN-TAZOBACTAM 3.375 G IVPB 30 MIN
3.3750 g | Freq: Once | INTRAVENOUS | Status: AC
  Administered 2017-03-27: 3.375 g via INTRAVENOUS
  Filled 2017-03-27: qty 50

## 2017-03-27 MED ORDER — PIPERACILLIN-TAZOBACTAM 3.375 G IVPB
3.3750 g | Freq: Three times a day (TID) | INTRAVENOUS | Status: DC
  Administered 2017-03-27 – 2017-03-29 (×7): 3.375 g via INTRAVENOUS
  Filled 2017-03-27 (×9): qty 50

## 2017-03-27 NOTE — Clinical Social Work Placement (Signed)
   CLINICAL SOCIAL WORK PLACEMENT  NOTE  Date:  03/27/2017  Patient Details  Name: Charlotte [name] MRN: [id number] Date of Birth: 11-Dec-1940  Clinical Social Work is seeking post-discharge placement for this patient at the [place] level of care (*CSW will initial, date and re-position this form in  chart as items are completed):  Yes   Patient/family provided with [facility] Work Department's list of facilities offering this level of care within the geographic area requested by the patient (or if unable, by the patient's family).  Yes   Patient/family informed of their freedom to choose among providers that offer the needed level of care, that participate in Medicare, Medicaid or managed care program needed by the patient, have an available bed and are willing to accept the patient.  Yes   Patient/family informed of [facility]'s ownership interest in [facility] and [facility], as well as of the fact that they are under no obligation to receive care at these facilities.  PASRR submitted to EDS on 03/27/17     PASRR number received on 03/27/17     Existing PASRR number confirmed on       FL2 transmitted to all facilities in geographic area requested by pt/family on 03/27/17     FL2 transmitted to all facilities within larger geographic area on       Patient informed that his/her managed care company has contracts with or will negotiate with certain facilities, including the following:            Patient/family informed of bed offers received.  Patient chooses bed at       Physician recommends and patient chooses bed at      Patient to be transferred to   on  .  Patient to be transferred to facility by       Patient family notified on   of transfer.  Name of family member notified:        PHYSICIAN Please sign FL2     Additional Comment:    _______________________________________________ Candie Chroman, LCSW 03/27/2017,  3:14 PM

## 2017-03-27 NOTE — Progress Notes (Signed)
eLink Physician-Brief Progress Note Patient Name: Charlotte [name] DOB: February 13, 1941 MRN: [id number]   Date of Service  03/27/2017  HPI/Events of Note  Leukocytosis - WBC =  61.4 this AM. Source felt to be ischemic limb.  eICU Interventions  Will order: 1. Blood cultures X 2.  2. Zosyn per pharmacy consultation.      Intervention Category Major Interventions: Infection - evaluation and management  Margia Wiesen [place] 03/27/2017, 5:56 AM

## 2017-03-27 NOTE — Progress Notes (Signed)
PROGRESS NOTE    Charlotte [name]  HBZ:[id number] DOB: 02-Jul-1941 DOA: 03/04/2017 PCP: [name], MD   Chief Complaint  Patient presents with  . Leg Pain    Brief Narrative:  HPI 03/05/2017 by Dr. Tennis Must Charlotte [name] is a 76 y.o. female with medical history significant of osteoarthritis, chronic atrial fibrillation, diastolic heart failure on home oxygen, impaired hearing, history of DVT, essential hypertension, hyperlipidemia, hypothyroidism, peripheral neuropathy, polycythemia vera, history of severe peripheral vascular disease with a history of right femoral endarterectomy and femoral to above knee popliteal bypass in 2014, who was followed in early January this year by vascular surgery and at that time ultrasound revealed that her bypass was occluded. She denies history of claudication at that time, but has has a history of lower extremity wounds and ulcerations. She mentions that since Sunday her right lower extremity pain has increased and it looks discolored when compared to the left lower extremity. She denies fever, chills, but feels fatigue. Denies headache, sore throat, worsening chronic dyspnea, productive cough, chest pain, palpitations, dizziness, diaphoresis, lower extremity edema, PND or orthopnea. She denies abdominal pain, nausea, emesis, diarrhea, constipation, melena, hematochezia, dysuria, frequency or hematuria. She denies polyuria, polydipsia or blurred vision.  Interim history Patient underwent bypass on 03/11/2017 and remained in the ICU intubated. Later was placed in stepdown and given diuresis for CHF, however became hypotensive, was placed back in the ICU on pressors. 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 -Vascular surgery not planning on amputation on patient decompensates -continue pain control -Continue Zosyn  Acute diastolic CHF -Echocardiogram as listed below -Chest x-ray and CT abdomen did show evidence of volume overload -Patient currently on continuous Lasix drip -Will monitor intake and output, and daily weights -Urine output over the past 24 hours 1625cc -Weight improved from 98.4 kg to 88.1 kg  Atrial fibrillation -Required amiodarone drip, transitioned to oral  -Continue heparin   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, stage II-III -Creatinine peaked at 2.6 on 03/14/2017 -Upon review of Epic, baseline creatinine approximately 1-1.3 (earlier 2017, creatinine noted to be 1 however late 2017 creatinine 1.2-1.3) -Suspect creatinine worsened secondary to sepsis versus shock and infection along with diuresis for CHF -Creatinine improved 1.24 -Will continue to monitor BMP  Acute on chronic hypoxic respiratory failure -Patient uses approximately 4 L of home oxygen, currently on 5 L -Will attempt to wean as tolerated, continue pulm toilette  Transaminitis -Secondary to shocked liver -Resolved  Nausea/vomiting -Resolved, continue antiemetics as needed  Malnutrition with anasarca/dysphagia -Speech therapy consulted -Status post modified barium swallow, speech recommendation for regular diet texture with thin liquids  Polycythemia vera/leukocytosis -Leukocytosis can be made worse by polycythemia thereof versus patient's active infection with limb ischemia -Continue Hydrea -Continue to monitor CBC  Microcytic Anemia versus anemia secondary to blood loss from recent surgery -Baseline hemoglobin approximately 11-12, currently 9.5 and has been steady for the past several days -She  recently had anemia panel 03/03/2017 showed an iron of 15, ferritin of 61 -Continue to monitor CBC  Hypothyroidism -Continue IV Synthroid, will transition to oral form when patient is able to tolerate diet Hypoglycemia -Improving with  D10 infusion -Will attempt to wean as patient is able to tolerate diet  Klebsiella pneumonia -Noted on respiratory culture -Treated  Delirium -Appears to have resolved  Hypokalemia/Hypomagnesemia -Resolved, continue to monitor BMP  DVT Prophylaxis  heparin  Code Status: Full  Family Communication: None at bedside  Disposition Plan: Admitted. Continue to monitor in stepdown. Dispo TBD  Consultants PCCM Vascular surgery  Procedures / significant Events 8/6 Renal US: No evidence of hydronephrosis. Mildly increased renal parenchymal echogenicity may reflect medical renal disease. 8/8 RLE Fem-Pop bypass & returned to ICU 8/8 - 8/17 Intubated/Extubated 8/9 L [place] CV[facility] 8/10 Echocardiogram: EF 65-70%, no RWA MA. LA & RA severely dilated  8/21 Echocardiogram: EF 60-65%, normal wall motion w/o regional wall abnormality. Indeterminant diastolic function due to atrial fibrillation. LA and RA severely dilated.  Antibiotics   Anti-infectives    Start     Dose/Rate Route Frequency Ordered Stop   03/27/17 1400  piperacillin-tazobactam (ZOSYN) IVPB 3.375 g     3.375 g 12.5 mL/hr over 240 Minutes Intravenous Every 8 hours 03/27/17 0641     03/27/17 0645  piperacillin-tazobactam (ZOSYN) IVPB 3.375 g     3.375 g 100 mL/hr over 30 Minutes Intravenous  Once 03/27/17 0641 03/27/17 0728   03/22/17 2359  vancomycin (VANCOCIN) IVPB 750 mg/150 ml premix  Status:  Discontinued     750 mg 150 mL/hr over 60 Minutes Intravenous Daily at bedtime 03/22/17 2323 03/24/17 0950   03/22/17 0200  ciprofloxacin (CIPRO) IVPB 400 mg  Status:  Discontinued     400 mg 200 mL/hr over 60 Minutes Intravenous Every 12 hours 03/21/17 1529 03/22/17 0930   03/21/17 0700  vancomycin  (VANCOCIN) IVPB 750 mg/150 ml premix     750 mg 150 mL/hr over 60 Minutes Intravenous  Once 03/21/17 0656 03/21/17 0845   03/17/17 1100  vancomycin (VANCOCIN) IVPB 750 mg/150 ml premix  Status:  Discontinued     750 mg 150 mL/hr over 60 Minutes Intravenous Every 24 hours 03/16/17 0929 03/19/17 1147   03/16/17 1000  ciprofloxacin (CIPRO) IVPB 400 mg  Status:  Discontinued     400 mg 200 mL/hr over 60 Minutes Intravenous Every 24 hours 03/16/17 0858 03/21/17 1529   03/16/17 1000  vancomycin (VANCOCIN) 2,000 mg in sodium chloride 0.9 % 500 mL IVPB     2,000 mg 250 mL/hr over 120 Minutes Intravenous  Once 03/16/17 0929 03/16/17 1250   03/15/17 1000  cefTRIAXone (ROCEPHIN) 1 g in dextrose 5 % 50 mL IVPB  Status:  Discontinued     1 g 100 mL/hr over 30 Minutes Intravenous Every 24 hours 03/14/17 1054 03/16/17 0841   03/14/17 1100  cefTRIAXone (ROCEPHIN) 1 g in dextrose 5 % 50 mL IVPB  Status:  Discontinued     1 g 100 mL/hr over 30 Minutes Intravenous Every 24 hours 03/14/17 1054 03/14/17 1054   03/13/17 1100  vancomycin (VANCOCIN) 1,250 mg in sodium chloride 0.9 % 250 mL IVPB  Status:  Discontinued     1,250 mg 166.7 mL/hr over 90 Minutes Intravenous Every 24 hours 03/12/17 0937 03/13/17 0741   03/13/17 1100  vancomycin (VANCOCIN) IVPB 1000 mg/200 mL premix  Status:  Discontinued     1,000 mg 200 mL/hr over 60 Minutes Intravenous Every 24 hours 03/13/17 0741 03/14/17 1053   03/12/17 1000  ceFEPIme (MAXIPIME) 2 g in dextrose 5 % 50 mL IVPB  Status:  Discontinued     2 g 100 mL/hr over 30 Minutes  Intravenous Every 24 hours 03/12/17 0931 03/14/17 1053   03/12/17 1000  vancomycin (VANCOCIN) 1,750 mg in sodium chloride 0.9 % 500 mL IVPB     1,750 mg 250 mL/hr over 120 Minutes Intravenous  Once 03/12/17 0931 03/12/17 1210   03/11/17 2000  cefUROXime (ZINACEF) 1.5 g in dextrose 5 % 50 mL IVPB     1.5 g 100 mL/hr over 30 Minutes Intravenous Every 12 hours 03/11/17 1955 03/12/17 0930   03/11/17  1245  cefUROXime (ZINACEF) 1.5 g in dextrose 5 % 50 mL IVPB     1.5 g 100 mL/hr over 30 Minutes Intravenous On call to O.R. 03/11/17 1244 03/11/17 1500      Subjective:   Charlotte [name] seen and examined today.   Patient feeling well this morning. Has no complaints. Denies chest pain, short of breath, abdominal pain, nausea vomiting, diarrhea or constipation.  Objective:   Vitals:   03/26/17 2329 03/26/17 2334 03/27/17 0415 03/27/17 0803  BP:  101/71 101/62   Pulse:  98 89   Resp:  (!) 24 18   Temp: 97.8 F (36.6 C)  (!) 97.5 F (36.4 C) (!) 96.1 F (35.6 C)  TempSrc: Oral  Oral   SpO2:  97% 96%   Weight:      Height:        Intake/Output Summary (Last 24 hours) at 03/27/17 1045 Last data filed at 03/27/17 0900  Gross per 24 hour  Intake           1062.6 ml  Output             1625 ml  Net           -562.4 ml   Filed Weights   03/21/17 0500 03/23/17 0533 03/26/17 1604  Weight: 81.2 kg (179 lb 0.2 oz) 83.9 kg (184 lb 15.5 oz) 88.1 kg (194 lb 3.6 oz)    Exam  General: Well developed, ill-appearing, NAD  HEENT: NCAT, mucous membranes moist.   Cardiovascular: S1 S2 auscultated, irregular  Respiratory: Diminished breath sounds  Abdomen: Soft, nontender, nondistended, + bowel sounds  Extremities: warm dry without cyanosis clubbing. +2LE edema, +1UE edema. Right toe with dry gangrene. RLE incision intact with staples  Neuro: AAOx3, nonfocal  Psych: appropriate mood and affect   Data Reviewed: I have personally reviewed following labs and imaging studies  CBC:  Recent Labs Lab 03/23/17 0410 03/24/17 0951 03/25/17 0507 03/25/17 1723 03/26/17 0219 03/27/17 0449  WBC 23.4* 39.1* 44.1*  --  49.2* 61.4*  NEUTROABS  --   --   --   --   --  59.6*  HGB 8.8* 9.8* 8.8* 9.1* 9.1* 9.5*  HCT 30.0* 32.6* 29.6* 30.3* 30.3* 30.3*  MCV 78.9 78.7 78.5  --  78.5 78.9  PLT 151 226 250  --  326 254*   Basic Metabolic Panel:  Recent Labs Lab 03/22/17 0500  03/23/17 0410  03/24/17 0951 03/25/17 0507 03/25/17 1723 03/26/17 0218 03/26/17 0219 03/26/17 1143 03/26/17 2330  NA 139  --   < > 135 136 136  --  134* 135 132*  K 3.0*  --   < > 3.7 3.8 3.9  --  3.6 4.0 3.8  CL 106  --   < > 105 106 105  --  105 103 101  CO2 28  --   < > 24 24 23   --  24 24 22   GLUCOSE 89  --   < > 98 69 79  --  75 84 113*  BUN 45*  --   < > 30* 27* 27*  --  27* 26* 28*  CREATININE 1.20*  --   < > 1.16* 1.05* 1.16*  --  1.15* 1.18* 1.24*  CALCIUM 8.0*  --   < > 8.1* 7.8* 8.1*  --  7.8* 8.2* 7.9*  MG 1.6* 1.8  --  1.7 1.7  --  1.7  --  1.8 2.0  PHOS 2.5 3.2  --  3.3 3.3  --   --  3.6  --   --   < > = values in this interval not displayed. GFR: Estimated Creatinine Clearance: 49.7 mL/min (A) (by C-G formula based on SCr of 1.24 mg/dL (H)). Liver Function Tests:  Recent Labs Lab 03/23/17 1052 03/24/17 0951 03/25/17 0507 03/26/17 0219  AST 24  --   --   --   ALT 38  --   --   --   ALKPHOS 64  --   --   --   BILITOT 1.5*  --   --   --   PROT 4.7*  --   --   --   ALBUMIN 2.1* 2.1* 1.9* 2.0*   No results for input(s): LIPASE, AMYLASE in the last 168 hours. No results for input(s): AMMONIA in the last 168 hours. Coagulation Profile:  Recent Labs Lab 03/23/17 0411 03/24/17 0951 03/25/17 0507 03/26/17 0218 03/27/17 0449  INR 2.37 1.60 1.62 1.60 1.66   Cardiac Enzymes:  Recent Labs Lab 03/23/17 1710 03/23/17 2344 03/24/17 0951 03/24/17 1553 03/24/17 2123  TROPONINI 0.06* 0.20* 0.09* 0.07* 0.08*   BNP (last 3 results) No results for input(s): PROBNP in the last 8760 hours. HbA1C: No results for input(s): HGBA1C in the last 72 hours. CBG:  Recent Labs Lab 03/26/17 1700 03/26/17 1943 03/26/17 2327 03/27/17 0357 03/27/17 0805  GLUCAP 73 80 116* 117* 117*   Lipid Profile: No results for input(s): CHOL, HDL, LDLCALC, TRIG, CHOLHDL, LDLDIRECT in the last 72 hours. Thyroid Function Tests:  Recent Labs  03/26/17 1143  FREET4 1.29*    Anemia Panel: No results for input(s): VITAMINB12, FOLATE, FERRITIN, TIBC, IRON, RETICCTPCT in the last 72 hours. Urine analysis:    Component Value Date/Time   COLORURINE YELLOW 03/09/2017 1648   APPEARANCEUR CLEAR 03/09/2017 1648   LABSPEC 1.021 03/09/2017 1648   PHURINE 5.0 03/09/2017 1648   GLUCOSEU NEGATIVE 03/09/2017 1648   HGBUR MODERATE (A) 03/09/2017 1648   BILIRUBINUR NEGATIVE 03/09/2017 1648   KETONESUR NEGATIVE 03/09/2017 1648   PROTEINUR NEGATIVE 03/09/2017 1648   UROBILINOGEN 1.0 09/14/2012 2337   NITRITE NEGATIVE 03/09/2017 1648   LEUKOCYTESUR NEGATIVE 03/09/2017 1648   Sepsis Labs: @LABRCNTIP (procalcitonin:4,lacticidven:4)  )No results found for this or any previous visit (from the past 240 hour(s)).    Radiology Studies: Dg Swallowing Func-speech Pathology  Result Date: 03/27/2017 Objective Swallowing Evaluation: Type of Study: MBS-Modified Barium Swallow Study Patient Details Name: Charlotte [name] MRN: [id number] Date of Birth: June 13, 1941 Today's Date: 03/27/2017 Time: SLP Start Time (ACUTE ONLY): 0850-SLP Stop Time (ACUTE ONLY): 0910 SLP Time Calculation (min) (ACUTE ONLY): 20 min Past Medical History: Past Medical History: Diagnosis Date . Arthritis  . Atrial fibrillation, chronic (HCC)   a. on Coumadin for anticoagulation.  . CHF (congestive heart failure) ([place])  . Deaf  . Diastolic heart failure ([place])  . DVT (deep venous thrombosis) ([place])  . Essential hypertension  . Hyperlipidemia  . Hypothyroidism  . On home O2  . Peripheral neuropathy  .  Peripheral vascular disease ([place])  . Polycythemia vera(238.4) 10/01/2011 . Ulcer of ankle ([place])   Bilateral 2015 . Varicose veins  Past Surgical History: Past Surgical History: Procedure Laterality Date . ABDOMINAL AORTAGRAM N/A 09/14/2012  Procedure: ABDOMINAL [name];  Surgeon: Serafina Mitchell, MD;  Location: [facility] CATH LAB;  Service: Cardiovascular;  Laterality: N/A; . ABDOMINAL AORTOGRAM N/A 03/06/2017  Procedure: ABDOMINAL  AORTOGRAM;  Surgeon: Elam Dutch, MD;  Location: [place] CV LAB;  Service: Cardiovascular;  Laterality: N/A; . ABDOMINAL HYSTERECTOMY   . CORONARY ANGIOPLASTY   . DRESSING CHANGE UNDER ANESTHESIA  12/02/2011  Procedure: DRESSING CHANGE UNDER ANESTHESIA;  Surgeon: Carole Civil, MD;  Location: [facility];  Service: Orthopedics;  Laterality: Right; . ENDARTERECTOMY FEMORAL Right 09/15/2012  Procedure: ENDARTERECTOMY FEMORAL;  Surgeon: Mal Misty, MD;  Location: [place];  Service: Vascular;  Laterality: Right; . FASCIOTOMY  11/29/2011  Procedure: FASCIOTOMY;  Surgeon: Carole Civil, MD;  Location: [facility];  Service: Orthopedics;  Laterality: Right;  right thigh  . FEMORAL-POPLITEAL BYPASS GRAFT Right 09/15/2012  Procedure: BYPASS GRAFT FEMORAL-POPLITEAL ARTERY;  Surgeon: Mal Misty, MD;  Location: [place];  Service: Vascular;  Laterality: Right; . FEMORAL-TIBIAL BYPASS GRAFT Right 03/11/2017  Procedure: RIGHT FEMORAL-TIBIAL BYPASS IN-SITU GREATER SAPHENOUS VEIN;  Surgeon: Waynetta Sandy, MD;  Location: [place];  Service: Vascular;  Laterality: Right; . HIP PINNING,CANNULATED  11/19/2011  Procedure: CANNULATED HIP PINNING;  Surgeon: Sanjuana Kava, MD;  Location: [facility];  Service: Orthopedics;  Laterality: Right; . LOWER EXTREMITY ANGIOGRAPHY Bilateral 03/06/2017  Procedure: Lower Extremity Angiography;  Surgeon: Elam Dutch, MD;  Location: [place] CV LAB;  Service: Cardiovascular;  Laterality: Bilateral; . PATCH ANGIOPLASTY Right 09/15/2012  Procedure: PATCH ANGIOPLASTY;  Surgeon: Mal Misty, MD;  Location: [place];  Service: Vascular;  Laterality: Right; . TUBAL LIGATION   HPI: 76 year old female admitted 03/04/17 with right leg pain and discoloration, found to have pneumonia, septic shock. PMh significant for AFib, diastolic heart failure, HTN, HOH, PVD, polycysthemia. Pt underwent R fem to tp trunk bypass 03/11/17. She was intubated from 8/8-8/17. Chest x-ray 8/18 shows Lung opacities  consistent with a combination of bilateral pleural effusions with pulmonary edema, atelectasis or a combination. Perihilar lung opacity appears mildly increased from the most recent prior study suggesting worsened pulmonary edema. Subjective: pt alert, upright in chair Assessment / Plan / Recommendation CHL IP CLINICAL IMPRESSIONS 03/27/2017 Clinical Impression Pt's swallow function has significantly improved. Pharyngeal constriction, laryngeal elevation and epiglottic inversion were within functional limits with minimal-mild residue with thicker consistencies not increasing pt's risk. No laryngeal penetration or aspiration observed. Barium pill hesitated in the vallecula with bite applesauce assisting transit to stomach. Recommend regular diet texture, thin liquids, straws allowed, pills with liquids. ST to follow briefly to ensure safey.    SLP Visit Diagnosis Dysphagia, pharyngeal phase (R13.13) Attention and concentration deficit following -- Frontal lobe and executive function deficit following -- Impact on safety and function Mild aspiration risk   CHL IP TREATMENT RECOMMENDATION 03/27/2017 Treatment Recommendations Therapy as outlined in treatment plan below   Prognosis 03/27/2017 Prognosis for Safe Diet Advancement Good Barriers to Reach Goals -- Barriers/Prognosis Comment -- CHL IP DIET RECOMMENDATION 03/27/2017 SLP Diet Recommendations Regular solids;Thin liquid Liquid Administration via Straw;Cup Medication Administration Whole meds with liquid Compensations Slow rate;Small sips/bites;Minimize environmental distractions Postural Changes Seated upright at 90 degrees   CHL IP OTHER RECOMMENDATIONS 03/27/2017 Recommended Consults -- Oral Care Recommendations Oral care BID Other  Recommendations --   CHL IP FOLLOW UP RECOMMENDATIONS 03/27/2017 Follow up Recommendations None   CHL IP FREQUENCY AND DURATION 03/27/2017 Speech Therapy Frequency (ACUTE ONLY) min 2x/week Treatment Duration 2 weeks      CHL IP ORAL PHASE  03/27/2017 Oral Phase WFL Oral - Pudding Teaspoon -- Oral - Pudding Cup -- Oral - Honey Teaspoon -- Oral - Honey Cup -- Oral - Nectar Teaspoon -- Oral - Nectar Cup -- Oral - Nectar Straw -- Oral - Thin Teaspoon -- Oral - Thin Cup -- Oral - Thin Straw -- Oral - Puree -- Oral - Mech Soft -- Oral - Regular -- Oral - Multi-Consistency -- Oral - Pill -- Oral Phase - Comment --  CHL IP PHARYNGEAL PHASE 03/27/2017 Pharyngeal Phase Impaired Pharyngeal- Pudding Teaspoon -- Pharyngeal -- Pharyngeal- Pudding Cup -- Pharyngeal -- Pharyngeal- Honey Teaspoon -- Pharyngeal -- Pharyngeal- Honey Cup Pharyngeal residue - valleculae;Pharyngeal residue - pyriform Pharyngeal -- Pharyngeal- Nectar Teaspoon -- Pharyngeal -- Pharyngeal- Nectar Cup Pharyngeal residue - valleculae;Pharyngeal residue - pyriform Pharyngeal Material does not enter airway Pharyngeal- Nectar Straw NT Pharyngeal -- Pharyngeal- Thin Teaspoon NT Pharyngeal -- Pharyngeal- Thin Cup [facility] Pharyngeal Material does not enter airway Pharyngeal- Thin Straw WFL Pharyngeal -- Pharyngeal- Puree NT Pharyngeal -- Pharyngeal- Mechanical Soft -- Pharyngeal -- Pharyngeal- Regular WFL Pharyngeal -- Pharyngeal- Multi-consistency -- Pharyngeal -- Pharyngeal- Pill Pharyngeal residue - valleculae;Reduced tongue base retraction Pharyngeal -- Pharyngeal Comment --  CHL IP CERVICAL ESOPHAGEAL PHASE 03/27/2017 Cervical Esophageal Phase WFL Pudding Teaspoon -- Pudding Cup -- Honey Teaspoon -- Honey Cup -- Nectar Teaspoon -- Nectar Cup -- Nectar Straw -- Thin Teaspoon -- Thin Cup -- Thin Straw -- Puree -- Mechanical Soft -- Regular -- Multi-consistency -- Pill -- Cervical Esophageal Comment -- CHL IP GO 02/13/2017 Functional Assessment Tool Used clinical judgement Functional Limitations Swallowing Swallow Current Status (K5397) CI Swallow Goal Status (Q7341) CI Swallow Discharge Status (P3790) CI Motor Speech Current Status (W4097) (None) Motor Speech Goal Status (D5329) (None) Motor Speech Goal  Status (J2426) (None) Spoken Language Comprehension Current Status (S3419) (None) Spoken Language Comprehension Goal Status (Q2229) (None) Spoken Language Comprehension Discharge Status (N9892) (None) Spoken Language Expression Current Status (J1941) (None) Spoken Language Expression Goal Status (D4081) (None) Spoken Language Expression Discharge Status (K4818) (None) Attention Current Status (H6314) (None) Attention Goal Status (H7026) (None) Attention Discharge Status (V7858) (None) Memory Current Status (I5027) (None) Memory Goal Status (X4128) (None) Memory Discharge Status (N8676) (None) Voice Current Status (H2094) (None) Voice Goal Status (B0962) (None) Voice Discharge Status (E3662) (None) Other Speech-Language Pathology Functional Limitation Current Status (H4765) (None) Other Speech-Language Pathology Functional Limitation Goal Status (Y6503) (None) Other Speech-Language Pathology Functional Limitation Discharge Status [phone]) (None) Houston Siren 03/27/2017, 9:47 AM Orbie Pyo Colvin Caroli.Ed CCC-SLP Pager [phone]                Scheduled Meds: . amiodarone  200 mg Oral BID  . aspirin  81 mg Oral Daily  . Chlorhexidine Gluconate Cloth  6 each Topical Daily  . gabapentin  100 mg Oral Q8H  . Gerhardt's butt cream   Topical Daily  . hydrocortisone sod succinate (SOLU-CORTEF) inj  50 mg Intravenous Q6H  . hydroxyurea  1,000 mg Oral Daily  . insulin aspart  0-15 Units Subcutaneous Q4H  . levothyroxine  37.5 mcg Intravenous Daily  . mouth rinse  15 mL Mouth Rinse BID  . midodrine  5 mg Oral TID WC  . pantoprazole  20 mg Oral Q1200  .  sodium chloride flush  10-40 mL Intracatheter Q12H   Continuous Infusions: . dextrose 10 mL/hr at 03/26/17 0800  . furosemide (LASIX) infusion 8 mg/hr (03/26/17 1203)  . heparin 1,900 Units/hr (03/27/17 7096)  . piperacillin-tazobactam (ZOSYN)  IV       LOS: 23 days   Time Spent in minutes   30 minutes  Lunabelle Oatley D.O. on 03/27/2017 at 10:45  AM  Between 7am to 7pm - Pager - [phone]  After 7pm go to [email or web address] - password [email or web address]  And look for the night coverage person covering for me after hours  [facility] Office  [phone]

## 2017-03-27 NOTE — Progress Notes (Signed)
Physical Therapy Treatment Patient Details Name: Charlotte [name] MRN: [id number] DOB: 1941/01/20 Today's Date: 03/27/2017    History of Present Illness This 76 y.o. female admitted on 8/1 wiht limb ischemia and pain.  She underwent Rt femoral to tibial artery bypass 03/11/17.   She remained intubated 8/8- 8/17.  She developed uncontrolled A-FIb 8/19 and hypotension 8/20 requiring pressor support.  Pt with gangrene toes Rt foot - likely will require BKA or toe amputations in future- allowing foot to demarcate.   PMH includes:  A-Fib, polycythemia, PVD, CHF with chronic 02 therapy     PT Comments    Patient required mod/max A for bed mobility and max A +2 for lateral scoot transfer EOB to recliner. Continue to progress as tolerated.    Follow Up Recommendations  CIR     Equipment Recommendations  None recommended by PT    Recommendations for Other Services Rehab consult     Precautions / Restrictions Precautions Precautions: Fall Restrictions Weight Bearing Restrictions: No    Mobility  Bed Mobility Overal bed mobility: Needs Assistance Bed Mobility: Rolling;Sidelying to Sit Rolling: Mod assist Sidelying to sit: Mod assist;HOB elevated       General bed mobility comments: assist to roll bilat LE and hips with pt using bed rail to roll into sidelying for pericare; assist to bring bilat LE from EOB and to elevate trunk into sitting; cues for sequencing  Transfers Overall transfer level: Needs assistance   Transfers: Lateral/Scoot Transfers          Lateral/Scoot Transfers: Max assist;+2 physical assistance General transfer comment: attempted to stand from EOB X2 but unable to lift bottom from surface; +2 assist for lateral scoot toward L side with drop arm recliner; cues for sequencing and hand placement; R foot blocked to prevent sliding  Ambulation/Gait                 Stairs            Wheelchair Mobility    Modified Rankin (Stroke Patients Only)       Balance Overall balance assessment: Needs assistance Sitting-balance support: Single extremity supported Sitting balance-Leahy Scale: Fair                                      Cognition Arousal/Alertness: Awake/alert Behavior During Therapy: WFL for tasks assessed/performed Overall Cognitive Status: Within Functional Limits for tasks assessed                                        Exercises      General Comments        Pertinent Vitals/Pain Pain Assessment: Faces Faces Pain Scale: Hurts even more Pain Location: R LE with mobility Pain Descriptors / Indicators: Grimacing;Sore Pain Intervention(s): Limited activity within patient's tolerance;Monitored during session;Premedicated before session;Repositioned    Home Living                      Prior Function            PT Goals (current goals can now be found in the care plan section) Acute Rehab PT Goals PT Goal Formulation: With patient Time For Goal Achievement: 04/07/17 Potential to Achieve Goals: Good Progress towards PT goals: Progressing toward goals    Frequency    Min  3X/week      PT Plan Current plan remains appropriate    Co-evaluation              AM-PAC PT "6 Clicks" Daily Activity  Outcome Measure  Difficulty turning over in bed (including adjusting bedclothes, sheets and blankets)?: Unable Difficulty moving from lying on back to sitting on the side of the bed? : Unable Difficulty sitting down on and standing up from a chair with arms (e.g., wheelchair, bedside commode, etc,.)?: Unable Help needed moving to and from a bed to chair (including a wheelchair)?: A Lot Help needed walking in hospital room?: Total Help needed climbing 3-5 steps with a railing? : Total 6 Click Score: 7    End of Session Equipment Utilized During Treatment: Gait belt;Oxygen Activity Tolerance: Patient limited by fatigue Patient left: in chair;with call bell/phone  within reach;with chair alarm set Nurse Communication: Mobility status;Other (comment) (new purewick needs to be placed) PT Visit Diagnosis: Other abnormalities of gait and mobility (R26.89);Muscle weakness (generalized) (M62.81);History of falling (Z91.81);Difficulty in walking, not elsewhere classified (R26.2);Other symptoms and signs involving the nervous system (R29.898);Pain Pain - Right/Left: Right Pain - part of body: Leg     Time: 1191-4782 PT Time Calculation (min) (ACUTE ONLY): 27 min  Charges:  $Therapeutic Activity: 23-37 mins                    G Codes:       Earney Navy, PTA Pager: [phone]     Darliss Cheney 03/27/2017, 3:53 PM

## 2017-03-27 NOTE — Clinical Social Work Note (Signed)
Clinical Social Work Assessment  Patient Details  Name: Charlotte [name] MRN: [id number] Date of Birth: 1940/12/22  Date of referral:  03/27/17               Reason for consult:  Facility Placement, Discharge Planning                Permission sought to share information with:  Chartered certified accountant granted to share information::  Yes, Verbal Permission Granted  Name::        Agency::  SNF's  Relationship::     Contact Information:     Housing/Transportation Living arrangements for the past 2 months:  Single Family Home Source of Information:  Patient, Medical Team Patient Interpreter Needed:  None Criminal Activity/Legal Involvement Pertinent to Current Situation/Hospitalization:  No - Comment as needed Significant Relationships:  Adult Children, Spouse Lives with:  Adult Children, Spouse Do you feel safe going back to the place where you live?  Yes Need for family participation in patient care:  Yes (Comment)  Care giving concerns:  PT recommending CIR. CSW initiating SNF backup plan.   Social Worker assessment / plan:  CSW met with patient. No supports at bedside. CSW introduced role and explained that PT recommendations would be discussed. Patient told CSW she prefers to go to SNF so she can get rehab in [place] where she lives. A few minutes later, CSW heard patient having a conversation with an intern stating she is hopeful she can go to CIR. No further concerns. CSW encouraged patient to contact CSW as needed. CSW will continue to follow patient for support and facilitate discharge to SNF, if needed, once medically stable.  Employment status:  Retired Forensic scientist:  Medicare PT Recommendations:  Inpatient [place] / Referral to community resources:  [place]  Patient/Family's Response to care:  Patient agreeable to CIR and SNF. Patient's family supportive and involved in patient's care. Patient appreciated  social work intervention.  Patient/Family's Understanding of and Emotional Response to Diagnosis, Current Treatment, and Prognosis:  Patient has a good understanding of the reason for admission and her need for rehab prior to returning home. Patient stated she was told she may get to go to CIR the middle of next week. Patient appears happy with hospital care.  Emotional Assessment Appearance:  Appears stated age Attitude/Demeanor/Rapport:  Other (Pleasant) Affect (typically observed):  Accepting, Appropriate, Calm, Pleasant Orientation:  Oriented to Place, Oriented to Self, Oriented to  Time, Oriented to Situation Alcohol / Substance use:  Never Used Psych involvement (Current and /or in the community):  No (Comment)  Discharge Needs  Concerns to be addressed:  Care Coordination Readmission within the last 30 days:  Yes Current discharge risk:  Dependent with Mobility Barriers to Discharge:  Continued Medical Work up   Candie Chroman, LCSW 03/27/2017, 3:11 PM

## 2017-03-27 NOTE — Progress Notes (Signed)
Modified Barium Swallow Progress Note  Patient Details  Name: Charlotte [name] MRN: [id number] Date of Birth: Apr 09, 1941  Today's Date: 03/27/2017  Modified Barium Swallow completed.  Full report located under Chart Review in the Imaging Section.  Brief recommendations include the following:  Clinical Impression  Pt's swallow function has significantly improved. Pharyngeal constriction, laryngeal elevation and epiglottic inversion were within functional limits with minimal-mild residue with thicker consistencies not increasing pt's risk. No laryngeal penetration or aspiration observed. Barium pill hesitated in the vallecula with bite applesauce assisting transit to stomach. Recommend regular diet texture, thin liquids, straws allowed, pills with liquids. ST to follow briefly to ensure safey.      Swallow Evaluation Recommendations       SLP Diet Recommendations: Regular solids;Thin liquid   Liquid Administration via: Straw;Cup   Medication Administration: Whole meds with liquid   Supervision: Patient able to self feed;Intermittent supervision to cue for compensatory strategies   Compensations: Slow rate;Small sips/bites;Minimize environmental distractions   Postural Changes: Seated upright at 90 degrees   Oral Care Recommendations: Oral care BID        Houston Siren 03/27/2017,11:01 AM

## 2017-03-27 NOTE — Progress Notes (Signed)
Orthopedic Tech Progress Note Patient Details:  Charlotte [name] 26-Feb-1941 [id number]  Ortho Devices Type of Ortho Device:  (prafo boot) Ortho Device/Splint Location: rle Ortho Device/Splint Interventions: Application   Raylan Hanton 03/27/2017, 10:41 AM

## 2017-03-27 NOTE — Progress Notes (Signed)
ANTICOAGULATION CONSULT NOTE - Follow Up Consult  Pharmacy Consult for Heparin  Indication: atrial fibrillation  No Known Allergies  Patient Measurements: Height: 6' (182.9 cm) Weight: 194 lb 3.6 oz (88.1 kg) IBW/kg (Calculated) : 73.1  Vital Signs: Temp: 96.1 F (35.6 C) (08/24 0803) Temp Source: Oral (08/24 0415) BP: 101/62 (08/24 0415) Pulse Rate: 89 (08/24 0415)  Labs:  Recent Labs  03/24/17 1553  03/24/17 2123  03/25/17 0507 03/25/17 1723 03/26/17 0201 03/26/17 0218 03/26/17 0219 03/26/17 1143 03/26/17 2330 03/27/17 0449  HGB  --   --   --   < > 8.8* 9.1*  --   --  9.1*  --   --  9.5*  HCT  --   --   --   < > 29.6* 30.3*  --   --  30.3*  --   --  30.3*  PLT  --   --   --   --  250  --   --   --  326  --   --  480*  LABPROT  --   --   --   --  19.4*  --   --  19.2*  --   --   --  19.8*  INR  --   --   --   --  1.62  --   --  1.60  --   --   --  1.66  HEPARINUNFRC  --   < >  --   --  0.30  --  0.51  --   --   --   --  0.54  CREATININE  --   --   --   < > 1.05* 1.16*  --   --  1.15* 1.18* 1.24*  --   TROPONINI 0.07*  --  0.08*  --   --   --   --   --   --   --   --   --   < > = values in this interval not displayed.  Estimated Creatinine Clearance: 49.7 mL/min (A) (by C-G formula based on SCr of 1.24 mg/dL (H)).   . dextrose 10 mL/hr at 03/26/17 0800  . furosemide (LASIX) infusion 8 mg/hr (03/26/17 1203)  . heparin 1,900 Units/hr (03/27/17 1610)  . piperacillin-tazobactam (ZOSYN)  IV      Assessment: 41 yoF on warfarin PTA for AFib initially held and bridged with heparin given need for procedures. INR trended back up and heparin was held, then resumed 8/16 with subtherapeutic INR s/p vitamin K. Heparin held again 8/20 as INR trended back above 2.0 x2 days but now subtherapeutic s/p vitamin K and heparin resumed.   Heparin level remains therapeutic: 0.54, INR remains below 2.0, CBC stable. No bleeding documented.  Goal of Therapy: Heparin level 0.3-0.7  units/ml Monitor platelets by anticoagulation protocol: Yes   Plan:  -Continue heparin gtt at 1900 units/hr -Monitor daily INR, heparin level, CBC  Georga Bora, PharmD Clinical Pharmacist 03/27/2017 10:08 AM

## 2017-03-27 NOTE — NC FL2 (Signed)
[place] MEDICAID FL2 LEVEL OF CARE SCREENING TOOL     IDENTIFICATION  Patient Name: Charlotte [name] Birthdate: 1940-09-20 Sex: female Admission Date (Current Location): 03/04/2017  [facility] and [place] Number:  [employer] and Address:  [facility] [place] [address], [place], [place] [address]      Provider Number: [id number]  Attending Physician Name and Address:  Cristal Ford, DO  Relative Name and Phone Number:       Current Level of Care: Hospital Recommended Level of Care: [place] Prior Approval Number:    Date Approved/Denied:   PASRR Number: [id number]  Discharge Plan: SNF    Current Diagnoses: Patient Active Problem List   Diagnosis Date Noted  . Pressure injury of skin 03/14/2017  . Acute renal failure (ARF) ([place])   . Hyperlipidemia 03/05/2017  . Lower extremity arterial insufficiency, severe, right ([place]) 03/04/2017  . Chronic respiratory failure with hypoxia ([place]) 02/12/2017  . CAP (community acquired pneumonia) 02/12/2017  . Cellulitis 07/03/2016  . Anemia 07/03/2016  . Cough 11/29/2015  . Hypoxemia 11/29/2015  . Community acquired pneumonia 11/29/2015  . CHF (congestive heart failure) ([place]) 11/29/2015  . Acute on chronic respiratory failure with hypoxia ([place])   . Pleural effusion on left   . Acute on chronic diastolic (congestive) heart failure ([place])   . Dyspnea 11/22/2014  . Pleural effusion 11/22/2014  . Hard of hearing 11/22/2014  . Thrombocytosis ([place]) 11/22/2014  . Acute respiratory failure with hypoxemia ([place]) 11/22/2014  . Encounter for therapeutic drug monitoring 08/31/2013  . Aftercare following surgery of the circulatory system, [place] 12/21/2012  . Pain in limb 10/19/2012  . Peripheral vascular disease ([place]) 09/24/2012  . Swollen leg 09/24/2012  . Varicose veins of lower extremities with other complications 00/86/7619  . 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 Wound infection after surgery 01/07/2012  . Compartment syndrome, nontraumatic, lower extremity 11/29/2011  . Pain of right thigh 11/28/2011  . Hip fracture, right ([place]) 11/15/2011  . Chronic atrial fibrillation ([place]) 11/15/2011  . Anticoagulant long-term use 11/15/2011  . HTN (hypertension) 11/15/2011  . Hypothyroidism 11/15/2011  . 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 Right inner and outer calf: Gauze.)                   Personal Care Assistance Level of Assistance  Bathing, Feeding, Dressing Bathing Assistance: Maximum assistance Feeding assistance: Limited assistance Dressing Assistance: Maximum assistance     Functional Limitations Info  Sight, Hearing, Speech Sight Info: Adequate Hearing Info: Impaired Speech Info: Adequate    SPECIAL CARE FACTORS FREQUENCY  PT (By licensed PT), Blood pressure, OT (By licensed OT)     PT Frequency: 5 x week OT Frequency: 5 x week            Contractures Contractures Info: Not present    Additional Factors Info  Code Status,  Allergies Code Status Info: Full Allergies Info: NKDA           Current Medications (03/27/2017):  This is the current hospital active medication list Current Facility-Administered Medications  Medication Dose Route Frequency Provider Last Rate Last Dose  . acetaminophen (TYLENOL) tablet 650 mg  650 mg Oral Q6H  PRN Mannam, Praveen, MD   650 mg at 03/26/17 1740  . amiodarone (PACERONE) tablet 200 mg  200 mg Oral BID Javier Glazier, MD   200 mg at 03/27/17 0946  . aspirin chewable tablet 81 mg  81 mg Oral Daily Ollis, Brandi L, NP   81 mg at 03/27/17 0946  . Chlorhexidine Gluconate Cloth 2 % PADS 6 each  6 each Topical Daily Mannam, Praveen, MD   6 each at 03/27/17 1000  . dextrose 10 % infusion   Intravenous Continuous Corey Harold, NP 10 mL/hr at 03/26/17 0800    . furosemide (LASIX) 250 mg in dextrose 5 % 250 mL (1 mg/mL) infusion  8 mg/hr Intravenous Continuous Raylene Miyamoto, MD 8 mL/hr at 03/26/17 1203 8 mg/hr at 03/26/17 1203  . gabapentin (NEURONTIN) 250 MG/5ML solution 100 mg  100 mg Oral Q8H Honor Loh, RPH   100 mg at 03/27/17 1322  . Gerhardt's butt cream   Topical Daily Rosita Fire, MD   1 application at 35/57/32 [phone]  . heparin ADULT infusion 100 units/mL (25000 units/266mL sodium chloride 0.45%)  1,900 Units/hr Intravenous Continuous Einar Grad, RPH 19 mL/hr at 03/27/17 0812 1,900 Units/hr at 03/27/17 0812  . hydrocortisone sodium succinate (SOLU-CORTEF) 100 MG injection 50 mg  50 mg Intravenous Q6H Raylene Miyamoto, MD   50 mg at 03/27/17 1243  . hydroxyurea (HYDREA) capsule 1,000 mg  1,000 mg Oral Daily Javier Glazier, MD   1,000 mg at 03/27/17 0946  . insulin aspart (novoLOG) injection 0-15 Units  0-15 Units Subcutaneous Q4H Deterding, Guadelupe Sabin, MD   2 Units at 03/23/17 2332  . levothyroxine (SYNTHROID, LEVOTHROID) injection 37.5 mcg  37.5 mcg Intravenous Daily Raylene Miyamoto, MD   37.5 mcg at 03/27/17 0946  . MEDLINE mouth rinse  15 mL Mouth Rinse BID Mannam, Praveen, MD   15 mL at 03/27/17 1000  . midodrine (PROAMATINE) tablet 5 mg  5 mg Oral TID WC Javier Glazier, MD   5 mg at 03/27/17 1243  . morphine 4 MG/ML injection 2-4 mg  2-4 mg Intravenous Q4H PRN Collene Gobble, MD   4 mg at 03/26/17 2235  . ondansetron (ZOFRAN) injection 4  mg  4 mg Intravenous Q6H PRN Hosie Poisson, MD       Or  . ondansetron (ZOFRAN) tablet 4 mg  4 mg Oral Q6H PRN Hosie Poisson, MD      . pantoprazole (PROTONIX) EC tablet 20 mg  20 mg Oral Q1200 Javier Glazier, MD   20 mg at 03/27/17 1255  . piperacillin-tazobactam (ZOSYN) IVPB 3.375 g  3.375 g Intravenous Q8H Bryk, Veronda P, RPH 12.5 mL/hr at 03/27/17 1322 3.375 g at 03/27/17 1322  . RESOURCE THICKENUP CLEAR   Oral PRN Juanito Doom, MD      . senna-docusate (Senokot-S) tablet 1 tablet  1 tablet Oral QHS PRN Joette Catching T, MD      . sodium chloride flush (NS) 0.9 % injection 10-40 mL  10-40 mL Intracatheter Q12H Juanito Doom, MD   10 mL at 03/27/17 0949  . sodium chloride  flush (NS) 0.9 % injection 10-40 mL  10-40 mL Intracatheter PRN Juanito Doom, MD         Discharge Medications: Please see discharge summary for a list of discharge medications.  Relevant Imaging Results:  Relevant Lab Results:   Additional Information SS#: [id number]  Candie Chroman, LCSW

## 2017-03-27 NOTE — Consult Note (Signed)
   [facility] Inpatient Consult   03/27/2017  Charlotte [name] 04/18/1941 [id number]    [facility] Care Management follow up.  Chart reviewed. [name] is on stepdown unit.  Appears discharge plan is for [facility] inpatient rehab.  Will continue to follow and engage for potential [facility] Care Management services when appropriate.    Marthenia Rolling, MSN-Ed, RN,BSN [facility] Liaison [phone]

## 2017-03-27 NOTE — Progress Notes (Signed)
CRITICAL VALUE ALERT  Critical Value:  WBC 61.4   Date & Time Notied:  8/24  0553  Provider Notified: Warren Lacy called MD Oletta Darter  Orders Received/Actions taken: Stat blood cultures and started on Zosyn. Will continue to monitor.

## 2017-03-27 NOTE — Progress Notes (Signed)
  Progress Note    03/27/2017 8:22 AM 16 Days Post-Op  Subjective:  Feeling better overall this a.m.  Vitals:   03/27/17 0415 03/27/17 0803  BP: 101/62   Pulse: 89   Resp: 18   Temp: (!) 97.5 F (36.4 C) (!) 96.1 F (35.6 C)  SpO2: 96%     Physical Exam: Awake and alert Non labored respirations Abdomen is soft All rle incisions are in tact, groin is closed others with staples R foot with stable ischemic changes Toes have dry gangrene Weak plantar flexion, sensation in tact, cannot move toes or dorsiflex foot  CBC    Component Value Date/Time   WBC 61.4 (HH) 03/27/2017 0449   RBC 3.84 (L) 03/27/2017 0449   HGB 9.5 (L) 03/27/2017 0449   HCT 30.3 (L) 03/27/2017 0449   PLT 480 (H) 03/27/2017 0449   MCV 78.9 03/27/2017 0449   MCH 24.7 (L) 03/27/2017 0449   MCHC 31.4 03/27/2017 0449   RDW 29.4 (H) 03/27/2017 0449   LYMPHSABS 1.2 03/27/2017 0449   MONOABS 0.6 03/27/2017 0449   EOSABS 0.0 03/27/2017 0449   BASOSABS 0.0 03/27/2017 0449    BMET    Component Value Date/Time   NA 132 (L) 03/26/2017 2330   K 3.8 03/26/2017 2330   CL 101 03/26/2017 2330   CO2 22 03/26/2017 2330   GLUCOSE 113 (H) 03/26/2017 2330   BUN 28 (H) 03/26/2017 2330   CREATININE 1.24 (H) 03/26/2017 2330   CREATININE 1.21 (H) 08/14/2016 0733   CALCIUM 7.9 (L) 03/26/2017 2330   GFRNONAA 42 (L) 03/26/2017 2330   GFRAA 48 (L) 03/26/2017 2330    INR    Component Value Date/Time   INR 1.66 03/27/2017 0449     Intake/Output Summary (Last 24 hours) at 03/27/17 0277 Last data filed at 03/27/17 0400  Gross per 24 hour  Intake            967.6 ml  Output             1625 ml  Net           -657.4 ml     Assessment:  76 y.o. female is s/p right fem-tp trunk bypass with vein  Plan: Continue to allow right foot to demarcate, the foot and leg to not appear that grossly ischemic to be causing a leukocytosis of 60k but if that is only source would consider amputation if she becomes unstable.  At this time will continue to monitor. cpk sent. Betadine paint to affected right toes. Foot drop boot ordered.    Brandon C. [name], MD [facility] Office: [phone] Pager: [phone]  03/27/2017 8:22 AM

## 2017-03-27 NOTE — Care Management Note (Signed)
Case Management Note Previous Note Created by Irven Shelling RN, BSN Unit 4E-Case Manager [phone]  Patient Details  Name: Charlotte [name] MRN: [id number] Date of Birth: 26-Dec-1940  Subjective/Objective:     Pt admitted with LE arterial insufficiency- plan for OR on Thur 8/9 for  Fem-tibioperoneal bypass for attempted limb salvage- remains on IV heparin              Action/Plan: PTA pt lived at home with spouse- will need PT/OT evals when able to participate to assist in d/c planning- pt may be eligible for Home First program with [facility]- CM will continue to follow post op for d/c needs and plan.   Expected Discharge Date:                 Expected Discharge Plan:  [place]  In-House Referral:  Clinical Social Work  Discharge planning Services  CM Consult  Post Acute Care Choice:    Choice offered to:     DME Arranged:    DME Agency:     HH Arranged:    [place] Agency:     Status of Service:  In process, will continue to follow  If discussed at Long Length of Stay Meetings, dates discussed:  8/7  Discharge Disposition:   Additional Comments: 03/27/2017 [name], RN, BSN [phone] Pt is now transferred back to [facility] recommended - CSW consulted for back up plan   03/23/17- 1120- Kristi Webster RN, CM- pt had been tx to SDU- however had low BP and had to be started on neo and returned to ICU for further care and monitoring- also requiring d10 to keep glucose up. CM to continue to follow for d/c needs- SNF vs HomeFirst? Pending progress when medically stable.    [name], RN 03/20/2017, 3:48 PM--Pt extubated today.  PT eval pending  03/19/17 Discussed in LOS 8/16 - pt remains appropriate for continued stay.  Pt remains on ventilator and not following commands  03/18/17 Informed by [facility] that pt was referred to them by Vascular Surgery for  [facility], PT and OT if needed.    Discussed in LOS 8/14 - pt remains  appropriate for continued stay.  LTACH referral given during LOS - however attending has deemed LTACH referral at this time to be inappropriate; pt remains on ETT vent and not stable to transfer.   [name], RN 03/27/2017, 9:08 AM [phone]

## 2017-03-27 NOTE — Consult Note (Signed)
Physical Medicine and Rehabilitation Consult Reason for Consult: Decreased functional mobility/ischemic right foot Referring Physician: [facility]   HPI: Charlotte [name] is a 76 y.o. right handed female with history of polycythemia, atrial fibrillation maintained on Coumadin, diastolic congestive heart failure, hypertension, peripheral neuropathy, COPD with chronic oxygen and severely hard of hearing. Per chart review patient lives with spouse as well as a son. One level home 3 steps to entry. Reported to be independent prior to admission. Patient with recent discharge 02/15/2017 for community-acquired pneumonia. Presented 03/05/2017 with right leg ischemic changes and had been followed by vascular surgery as an outpatient. Found to have right leg occlusion of right superficial femoral popliteal posterior tibial and anterior tibial arteries. Underwent right femoral to carotid bypass redo exposure a right common femoral artery 03/11/2017 per Dr. [name]. Hospital course complicated by respiratory failure required intubation until 03/20/2017. Acute blood loss anemia 9.5 and monitored. Developed uncontrolled atrial fibrillation followed by cardiology service as well as hypotension requiring pressor support. Right lower extremity monitored closely there is concern for possible need for toe amputation versus BKA. WBC's 60k today. Physical therapy evaluation completed 03/24/2017 with recommendations of physical medicine rehabilitation consult.   Review of Systems  Constitutional: Negative for chills and fever.  HENT: Positive for hearing loss.   Eyes: Negative for blurred vision and double vision.  Respiratory: Positive for shortness of breath. Negative for cough.   Cardiovascular: Positive for palpitations and leg swelling. Negative for chest pain.  Gastrointestinal: Positive for constipation. Negative for nausea.  Genitourinary: Negative for dysuria and hematuria.  Musculoskeletal: Positive for joint  pain and myalgias.  Skin: Negative for rash.  Neurological: Positive for weakness. Negative for seizures.  All other systems reviewed and are negative.  Past Medical History:  Diagnosis Date  . Arthritis   . Atrial fibrillation, chronic (HCC)    a. on Coumadin for anticoagulation.   . CHF (congestive heart failure) ([place])   . Deaf   . Diastolic heart failure ([place])   . DVT (deep venous thrombosis) ([place])   . Essential hypertension   . Hyperlipidemia   . Hypothyroidism   . On home O2   . Peripheral neuropathy   . Peripheral vascular disease ([place])   . 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 Ulcer of ankle ([place])    Bilateral 2015  . Varicose veins    Past Surgical History:  Procedure Laterality Date  . ABDOMINAL AORTAGRAM N/A 09/14/2012   Procedure: ABDOMINAL [name];  Surgeon: Serafina Mitchell, MD;  Location: [facility] CATH LAB;  Service: Cardiovascular;  Laterality: N/A;  . ABDOMINAL AORTOGRAM N/A 03/06/2017   Procedure: ABDOMINAL AORTOGRAM;  Surgeon: Elam Dutch, MD;  Location: [place] CV LAB;  Service: Cardiovascular;  Laterality: N/A;  . ABDOMINAL HYSTERECTOMY    . CORONARY ANGIOPLASTY    . DRESSING CHANGE UNDER ANESTHESIA  12/02/2011   Procedure: DRESSING CHANGE UNDER ANESTHESIA;  Surgeon: Carole Civil, MD;  Location: [facility];  Service: Orthopedics;  Laterality: Right;  . ENDARTERECTOMY FEMORAL Right 09/15/2012   Procedure: ENDARTERECTOMY FEMORAL;  Surgeon: Mal Misty, MD;  Location: [place];  Service: Vascular;  Laterality: Right;  . FASCIOTOMY  11/29/2011   Procedure: FASCIOTOMY;  Surgeon: Carole Civil, MD;  Location: [facility];  Service: Orthopedics;  Laterality: Right;  right thigh   . FEMORAL-POPLITEAL BYPASS GRAFT Right 09/15/2012   Procedure: BYPASS GRAFT FEMORAL-POPLITEAL ARTERY;  Surgeon: Mal Misty, MD;  Location: [place];  Service: Vascular;  Laterality: Right;  . FEMORAL-TIBIAL BYPASS GRAFT Right 03/11/2017   Procedure: RIGHT FEMORAL-TIBIAL BYPASS IN-SITU  GREATER SAPHENOUS VEIN;  Surgeon: Waynetta Sandy, MD;  Location: [place];  Service: Vascular;  Laterality: Right;  . HIP PINNING,CANNULATED  11/19/2011   Procedure: CANNULATED HIP PINNING;  Surgeon: Sanjuana Kava, MD;  Location: [facility];  Service: Orthopedics;  Laterality: Right;  . LOWER EXTREMITY ANGIOGRAPHY Bilateral 03/06/2017   Procedure: Lower Extremity Angiography;  Surgeon: Elam Dutch, MD;  Location: [place] CV LAB;  Service: Cardiovascular;  Laterality: Bilateral;  . PATCH ANGIOPLASTY Right 09/15/2012   Procedure: PATCH ANGIOPLASTY;  Surgeon: Mal Misty, MD;  Location: [place];  Service: Vascular;  Laterality: Right;  . TUBAL LIGATION     Family History  Problem Relation Age of Onset  . Heart failure Mother   . Heart disease Mother   . Stroke Father   . Heart disease Sister   . Diabetes Son   . Hypertension Sister   . Hypertension Sister   . Stroke Sister    Social History:  reports that she has never smoked. She has never used smokeless tobacco. She reports that she does not drink alcohol or use drugs. Allergies: No Known Allergies Medications Prior to Admission  Medication Sig Dispense Refill  . aspirin EC 81 MG tablet Take 81 mg by mouth daily.    Marland Kitchen CARTIA XT 240 MG 24 hr capsule TAKE ONE CAPSULE BY MOUTH ONCE DAILY 90 capsule 3  . gabapentin (NEURONTIN) 100 MG capsule Take 100 mg by mouth 3 (three) times daily.      Marland Kitchen guaiFENesin (MUCINEX) 600 MG 12 hr tablet Take 1 tablet (600 mg total) by mouth 2 (two) times daily. 30 tablet 0  . hydroxyurea (HYDREA) 500 MG capsule Take 2 capsules (1,000 mg total) by mouth daily. May take with food to minimize GI side effects. 60 capsule 2  . levothyroxine (SYNTHROID, LEVOTHROID) 75 MCG tablet Take 75 mcg by mouth daily before breakfast.     . metolazone (ZAROXOLYN) 2.5 MG tablet Take 2.5 mg if wt gain is 3 lbs or more in 24 hrs (Patient taking differently: Take 2.5 mg by mouth daily as needed (for fluid). Take 2.5 mg if  wt gain is 3 lbs or more in 24 hrs) 30 tablet 3  . metoprolol (LOPRESSOR) 100 MG tablet Take 100 mg by mouth 2 (two) times daily.    . Potassium Chloride ER 20 MEQ TBCR Take 20 mEq by mouth daily. 30 tablet 1  . pravastatin (PRAVACHOL) 20 MG tablet Take 10 mg by mouth daily.     Marland Kitchen torsemide (DEMADEX) 20 MG tablet Take 2 tablets (40 mg total) by mouth daily. 180 tablet 3  . warfarin (COUMADIN) 5 MG tablet Take 1 tablet daily except 1/2 tablet on Mondays, Wednesdays and Fridays (Patient taking differently: Take 2.5-5 mg by mouth See admin instructions. Take 1 tablet daily except 1/2 tablet on Mondays, Wednesdays, Fridays, and Saturdays) 90 tablet 4  . benzonatate (TESSALON) 100 MG capsule Take 1 capsule (100 mg total) by mouth 3 (three) times daily as needed for cough. (Patient not taking: Reported on 03/04/2017) 20 capsule 0    Home: Home Living Family/patient expects to be discharged to:: Private residence Living Arrangements: Spouse/significant other, Children Available Help at Discharge: Family, Available 24 hours/day Type of Home: Mobile home Home Access: Stairs to enter [employer] of Steps: 3 Entrance Stairs-Rails: Right, Left Home Layout: One level Bathroom Toilet: [place]  Equipment: Gilford Rile - 2 wheels, Bedside commode, Cane - single point Additional Comments: lives with spouse and son   Functional History: Prior Function Level of Independence: Independent Comments: Pt reports she was fully independent.  Drove.  She sponge bathes due to fall in tub ~5 years ago resulting in "broken hip_ Functional Status:  Mobility: Bed Mobility Overal bed mobility: Needs Assistance Bed Mobility: Rolling, Supine to Sit Rolling: Mod assist, +2 for physical assistance Supine to sit: Mod assist, +2 for physical assistance Sit to supine: Max assist, +2 for physical assistance General bed mobility comments: assist to bring bilat LE from EOB and elevate trunk into sitting; cues for  sequencing and technique; use of rails when rolling and to maintain sidelying for pericare prior to sitting EOB Transfers Overall transfer level: Needs assistance Transfers: Sit to/from Stand, Squat Pivot Transfers Squat pivot transfers: Max assist, +2 physical assistance General transfer comment: cues for technique and foot placement prior to stand; stood X2 from EOB and second trial used chuck pad to aid in lifting hips for pivot and for balance; partial sit<>stand Max A (could not get fully upright); with squat pivot to pt's left--pt unable to move RLE by herself      ADL: ADL Overall ADL's : Needs assistance/impaired Eating/Feeding: Set up, Bed level Grooming: Wash/dry hands, Wash/dry face, Oral care, Brushing hair, Sitting, Moderate assistance Upper Body Bathing: Moderate assistance, Sitting Lower Body Bathing: Maximal assistance, Bed level Upper Body Dressing : Maximal assistance, Sitting Lower Body Dressing: Total assistance, Bed level Toilet Transfer: Maximal assistance, +2 for physical assistance, Stand-pivot Toilet Transfer Details (indicate cue type and reason): going to pt's left with decreased ability to move RLE Toileting- Clothing Manipulation and Hygiene: Total assistance, Bed level Functional mobility during ADLs: Moderate assistance, +2 for physical assistance, Maximal assistance (bed mobility )  Cognition: Cognition Overall Cognitive Status: Within Functional Limits for tasks assessed Orientation Level: Oriented X4 Cognition Arousal/Alertness: Awake/alert Behavior During Therapy: WFL for tasks assessed/performed Overall Cognitive Status: Within Functional Limits for tasks assessed  Blood pressure 101/62, pulse 89, temperature (!) 97.5 F (36.4 C), temperature source Oral, resp. rate 18, height 6' (1.829 m), weight 88.1 kg (194 lb 3.6 oz), SpO2 96 %. Physical Exam  HENT:  Head: Normocephalic.  Eyes: EOM are normal.  Neck: Normal range of motion. Neck supple. No  thyromegaly present.  Cardiovascular:  Cardiac rate control  Respiratory: Effort normal and breath sounds normal. No respiratory distress.  2 L of oxygen in place  GI: Soft. Bowel sounds are normal. She exhibits no distension.  Musculoskeletal: She exhibits edema and tenderness.  Skin warm  Neurological: She is alert. She displays normal reflexes. She exhibits normal muscle tone.  Patient is very hard of hearing. Follows commands. Oriented to person place and time.decreased LT in both feet. UE grossly 5/5. RLE limited by pain/boot. LLE: 3/5 prox to 4/5 distally. Cognitively appropriate  Skin:  Significant ischemic changes to the right lower extremity as well as toes. Dressing in place. No odor  Psychiatric: She has a normal mood and affect. Her behavior is normal.    Results for orders placed or performed during the hospital encounter of 03/04/17 (from the past 24 hour(s))  Glucose, capillary     Status: Abnormal   Collection Time: 03/26/17  8:06 AM  Result Value Ref Range   Glucose-Capillary 61 (L) 65 - 99 mg/dL   Comment 1 Notify RN   Glucose, capillary     Status: None   Collection  Time: 03/26/17  8:47 AM  Result Value Ref Range   Glucose-Capillary 68 65 - 99 mg/dL  Glucose, capillary     Status: None   Collection Time: 03/26/17  9:22 AM  Result Value Ref Range   Glucose-Capillary 84 65 - 99 mg/dL  Cortisol     Status: None   Collection Time: 03/26/17 11:43 AM  Result Value Ref Range   Cortisol, Plasma 90.3 ug/dL  Basic metabolic panel     Status: Abnormal   Collection Time: 03/26/17 11:43 AM  Result Value Ref Range   Sodium 135 135 - 145 mmol/L   Potassium 4.0 3.5 - 5.1 mmol/L   Chloride 103 101 - 111 mmol/L   CO2 24 22 - 32 mmol/L   Glucose, Bld 84 65 - 99 mg/dL   BUN 26 (H) 6 - 20 mg/dL   Creatinine, Ser 1.18 (H) 0.44 - 1.00 mg/dL   Calcium 8.2 (L) 8.9 - 10.3 mg/dL   GFR calc non Af Amer 44 (L) >60 mL/min   GFR calc Af Amer 51 (L) >60 mL/min   Anion gap 8 5 - 15    Magnesium     Status: None   Collection Time: 03/26/17 11:43 AM  Result Value Ref Range   Magnesium 1.8 1.7 - 2.4 mg/dL  T3     Status: Abnormal   Collection Time: 03/26/17 11:43 AM  Result Value Ref Range   T3, Total 60 (L) 71 - 180 ng/dL  T4, free     Status: Abnormal   Collection Time: 03/26/17 11:43 AM  Result Value Ref Range   Free T4 1.29 (H) 0.61 - 1.12 ng/dL  Glucose, capillary     Status: None   Collection Time: 03/26/17 12:00 PM  Result Value Ref Range   Glucose-Capillary 77 65 - 99 mg/dL   Comment 1 Notify RN   Glucose, capillary     Status: None   Collection Time: 03/26/17  5:00 PM  Result Value Ref Range   Glucose-Capillary 73 65 - 99 mg/dL  Glucose, capillary     Status: None   Collection Time: 03/26/17  7:43 PM  Result Value Ref Range   Glucose-Capillary 80 65 - 99 mg/dL   Comment 1 Notify RN    Comment 2 Document in Chart   Glucose, capillary     Status: Abnormal   Collection Time: 03/26/17 11:27 PM  Result Value Ref Range   Glucose-Capillary 116 (H) 65 - 99 mg/dL   Comment 1 Notify RN    Comment 2 Document in Chart   Basic metabolic panel     Status: Abnormal   Collection Time: 03/26/17 11:30 PM  Result Value Ref Range   Sodium 132 (L) 135 - 145 mmol/L   Potassium 3.8 3.5 - 5.1 mmol/L   Chloride 101 101 - 111 mmol/L   CO2 22 22 - 32 mmol/L   Glucose, Bld 113 (H) 65 - 99 mg/dL   BUN 28 (H) 6 - 20 mg/dL   Creatinine, Ser 1.24 (H) 0.44 - 1.00 mg/dL   Calcium 7.9 (L) 8.9 - 10.3 mg/dL   GFR calc non Af Amer 42 (L) >60 mL/min   GFR calc Af Amer 48 (L) >60 mL/min   Anion gap 9 5 - 15  Magnesium     Status: None   Collection Time: 03/26/17 11:30 PM  Result Value Ref Range   Magnesium 2.0 1.7 - 2.4 mg/dL  Glucose, capillary     Status: Abnormal  Collection Time: 03/27/17  3:57 AM  Result Value Ref Range   Glucose-Capillary 117 (H) 65 - 99 mg/dL   Comment 1 Notify RN    Comment 2 Document in Chart   Heparin level (unfractionated)     Status: None    Collection Time: 03/27/17  4:49 AM  Result Value Ref Range   Heparin Unfractionated 0.54 0.30 - 0.70 IU/mL  CBC with Differential/Platelet     Status: Abnormal (Preliminary result)   Collection Time: 03/27/17  4:49 AM  Result Value Ref Range   WBC 61.4 (HH) 4.0 - 10.5 K/uL   RBC 3.84 (L) 3.87 - 5.11 MIL/uL   Hemoglobin 9.5 (L) 12.0 - 15.0 g/dL   HCT 30.3 (L) 36.0 - 46.0 %   MCV 78.9 78.0 - 100.0 fL   MCH 24.7 (L) 26.0 - 34.0 pg   MCHC 31.4 30.0 - 36.0 g/dL   RDW 29.4 (H) 11.5 - 15.5 %   Platelets 480 (H) 150 - 400 K/uL   Neutrophils Relative % PENDING %   Neutro Abs PENDING 1.7 - 7.7 K/uL   Band Neutrophils PENDING %   Lymphocytes Relative PENDING %   Lymphs Abs PENDING 0.7 - 4.0 K/uL   Monocytes Relative PENDING %   Monocytes Absolute PENDING 0.1 - 1.0 K/uL   Eosinophils Relative PENDING %   Eosinophils Absolute PENDING 0.0 - 0.7 K/uL   Basophils Relative PENDING %   Basophils Absolute PENDING 0.0 - 0.1 K/uL   WBC Morphology PENDING    RBC Morphology PENDING    Smear Review PENDING    nRBC PENDING 0 /100 WBC   Metamyelocytes Relative PENDING %   Myelocytes PENDING %   Promyelocytes Absolute PENDING %   Blasts PENDING %  Protime-INR     Status: Abnormal   Collection Time: 03/27/17  4:49 AM  Result Value Ref Range   Prothrombin Time 19.8 (H) 11.4 - 15.2 seconds   INR 1.66    No results found.  Assessment/Plan: Diagnosis: ischemic right lower extremity 1. Does the need for close, 24 hr/day medical supervision in concert with the patient's rehab needs make it unreasonable for this patient to be served in a less intensive setting? Yes 2. Co-Morbidities requiring supervision/potential complications: wound care, ID issues, HTN, afib 3. Due to bladder management, bowel management, safety, skin/wound care, disease management, medication administration, pain management and patient education, does the patient require 24 hr/day rehab nursing? Yes 4. Does the patient require  coordinated care of a physician, rehab nurse, PT (1-2 hrs/day, 5 days/week) and OT (1-2 hrs/day, 5 days/week) to address physical and functional deficits in the context of the above medical diagnosis(es)? Yes Addressing deficits in the following areas: balance, endurance, locomotion, strength, transferring, bowel/bladder control, bathing, dressing, feeding, grooming, toileting and psychosocial support 5. Can the patient actively participate in an intensive therapy program of at least 3 hrs of therapy per day at least 5 days per week? Yes 6. The potential for patient to make measurable gains while on inpatient rehab is excellent 7. Anticipated functional outcomes upon discharge from inpatient rehab are potentially mod I to supervision  with PT, potentially mod I to set up with OT, n/a with SLP. 8. Estimated rehab length of stay to reach the above functional goals is: potentially 10-14 days 9. Anticipated D/C setting: Home 10. Anticipated post D/C treatments: [place] therapy 11. Overall Rehab/Functional Prognosis: excellent  RECOMMENDATIONS: This patient's condition is appropriate for continued rehabilitative care in the following setting: CIR  Patient has agreed to participate in recommended program. Yes and Potentially Note that insurance prior authorization may be required for reimbursement for recommended care.  Comment:   WBC's are 60k today. Will follow along for surgical/ID issues.   Meredith Staggers, MD, [facility] 03/27/2017    Cathlyn Parsons., PA-C 03/27/2017

## 2017-03-27 NOTE — Progress Notes (Signed)
PT Cancellation Note  Patient Details Name: Charlotte [name] MRN: [id number] DOB: [birth year]   Cancelled Treatment:    Reason Eval/Treat Not Completed: Patient at procedure or test/unavailable PT will check on pt later as time allows.    [place] April, PTA Pager: [phone]   03/27/2017, 9:07 AM

## 2017-03-27 NOTE — Progress Notes (Signed)
Pharmacy Antibiotic Note  Charlotte [name] is a 76 y.o. female admitted on 03/04/2017, now with wound infection.  Pharmacy has been consulted for Zosyn dosing.  Plan: Zosyn 3.375g IV q8h (4 hour infusion).  Height: 6' (182.9 cm) Weight: 194 lb 3.6 oz (88.1 kg) IBW/kg (Calculated) : 73.1  Temp (24hrs), Avg:97.6 F (36.4 C), Min:97.5 F (36.4 C), Max:97.8 F (36.6 C)   Recent Labs Lab 03/21/17 0356  03/23/17 0410  03/24/17 0951 03/25/17 0507 03/25/17 1723 03/26/17 0219 03/26/17 1143 03/26/17 2330 03/27/17 0449  WBC  --   < > 23.4*  --  39.1* 44.1*  --  49.2*  --   --  61.4*  CREATININE 1.28*  < >  --   < > 1.16* 1.05* 1.16* 1.15* 1.18* 1.24*  --   LATICACIDVEN  --   --  1.1  --   --   --   --   --   --   --   --   VANCORANDOM 16  --   --   --   --   --   --   --   --   --   --   < > = values in this interval not displayed.  Estimated Creatinine Clearance: 49.7 mL/min (A) (by C-G formula based on SCr of 1.24 mg/dL (H)).    No Known Allergies   Thank you for allowing pharmacy to be a part of this patient's care.  Wynona Neat, PharmD, BCPS  03/27/2017 6:41 AM

## 2017-03-28 ENCOUNTER — Inpatient Hospital Stay (HOSPITAL_COMMUNITY): Payer: Medicare Other

## 2017-03-28 LAB — CBC WITH DIFFERENTIAL/PLATELET
Basophils Absolute: 0 10*3/uL (ref 0.0–0.1)
Basophils Relative: 0 %
EOS ABS: 0 10*3/uL (ref 0.0–0.7)
Eosinophils Relative: 0 %
HCT: 31 % — ABNORMAL LOW (ref 36.0–46.0)
Hemoglobin: 9.4 g/dL — ABNORMAL LOW (ref 12.0–15.0)
LYMPHS PCT: 1 %
Lymphs Abs: 0.6 10*3/uL — ABNORMAL LOW (ref 0.7–4.0)
MCH: 23.9 pg — ABNORMAL LOW (ref 26.0–34.0)
MCHC: 30.3 g/dL (ref 30.0–36.0)
MCV: 78.7 fL (ref 78.0–100.0)
MONO ABS: 0.6 10*3/uL (ref 0.1–1.0)
Monocytes Relative: 1 %
NEUTROS PCT: 98 %
Neutro Abs: 57.6 10*3/uL — ABNORMAL HIGH (ref 1.7–7.7)
PLATELETS: 681 10*3/uL — AB (ref 150–400)
RBC: 3.94 MIL/uL (ref 3.87–5.11)
RDW: 29.4 % — ABNORMAL HIGH (ref 11.5–15.5)
WBC: 58.8 10*3/uL (ref 4.0–10.5)

## 2017-03-28 LAB — HEPARIN LEVEL (UNFRACTIONATED)
HEPARIN UNFRACTIONATED: 0.84 [IU]/mL — AB (ref 0.30–0.70)
Heparin Unfractionated: 0.63 IU/mL (ref 0.30–0.70)

## 2017-03-28 LAB — PROTIME-INR
INR: 1.7
Prothrombin Time: 20.2 seconds — ABNORMAL HIGH (ref 11.4–15.2)

## 2017-03-28 NOTE — Progress Notes (Signed)
ANTICOAGULATION CONSULT NOTE - Follow Up Consult  Pharmacy Consult for heparin Indication: atrial fibrillation  Labs:  Recent Labs  03/25/17 1723 03/26/17 0201 03/26/17 0218 03/26/17 0219  03/26/17 2330 03/27/17 0449 03/27/17 1104 03/27/17 2313 03/28/17 0450 03/28/17 0451  HGB 9.1*  --   --  9.1*  --   --  9.5*  --   --   --   --   HCT 30.3*  --   --  30.3*  --   --  30.3*  --   --   --   --   PLT  --   --   --  326  --   --  480*  --   --   --   --   LABPROT  --   --  19.2*  --   --   --  19.8*  --   --  20.2*  --   INR  --   --  1.60  --   --   --  1.66  --   --  1.70  --   HEPARINUNFRC  --  0.51  --   --   --   --  0.54  --   --   --  0.84*  CREATININE 1.16*  --   --  1.15*  < > 1.24*  --  1.24* 1.32*  --   --   CKTOTAL  --   --   --   --   --   --   --  39  --   --   --   < > = values in this interval not displayed.   Assessment: 76yo female now above goal on heparin after three levels at goal, no gtt issues or signs of bleeding.  Goal of Therapy:  Heparin level 0.3-0.7 units/ml   Plan:  Will decrease heparin gtt by 2 units/kg/hr to 1750 units/hr and check level in [place], PharmD, BCPS  03/28/2017,6:51 AM

## 2017-03-28 NOTE — Progress Notes (Addendum)
[facility]  Subjective  -    Objective 125/76 85 97.7 F (36.5 C) (Oral) (!) 27 97%  Intake/Output Summary (Last 24 hours) at 03/28/17 0901 Last data filed at 03/28/17 9242  Gross per 24 hour  Intake             1493 ml  Output             1300 ml  Net              193 ml    Right LE incisions healing well DP doppler Dressing clean and dry adaptic to post blister area 4th and 5th toes continue to demarcate AFO for foot drop in place  Assessment/Planning: 76 y.o.femaleis s/p right fem-tp trunk bypass with vein  Leukocytosis, right foot with dry gangrene no drainage.  Right LE edema has decreased with time. AFO for foot drop in place Continue dry dressing with adaptic to residual blister area lower leg and foot. CK 39   COLLINS, EMMA MAUREEN 03/28/2017 9:01 AM   Addendum  I have independently interviewed and examined the patient, and I agree with the physician assistant's findings.  Doubt R leg is source of leukocytosis. CK was only 39 despite the significant amount of ischemic tissue.  Adele Barthel, MD, FACS [facility] Office: [phone] Pager: [phone]  --   Laboratory Lab Results:  Recent Labs  03/26/17 0219 03/27/17 0449  WBC 49.2* 61.4*  HGB 9.1* 9.5*  HCT 30.3* 30.3*  PLT 326 480*   BMET  Recent Labs  03/27/17 1104 03/27/17 2313  NA 136 135  K 3.4* 3.3*  CL 102 102  CO2 23 22  GLUCOSE 112* 99  BUN 28* 30*  CREATININE 1.24* 1.32*  CALCIUM 8.4* 8.2*    COAG Lab Results  Component Value Date   INR 1.70 03/28/2017   INR 1.66 03/27/2017   INR 1.60 03/26/2017   No results found for: PTT

## 2017-03-28 NOTE — Progress Notes (Signed)
ANTICOAGULATION CONSULT NOTE - Follow Up Consult  Pharmacy Consult for Heparin Indication: afib and PvD  No Known Allergies  Patient Measurements: Height: 6' (182.9 cm) Weight: 194 lb 3.6 oz (88.1 kg) IBW/kg (Calculated) : 73.1 Heparin Dosing Weight:  88.1 kg  Vital Signs: Temp: 97.5 F (36.4 C) (08/25 1139) Temp Source: Oral (08/25 1139) BP: 113/73 (08/25 1139) Pulse Rate: 93 (08/25 1139)  Labs:  Recent Labs  03/26/17 0218 03/26/17 0219  03/26/17 2330 03/27/17 0449 03/27/17 1104 03/27/17 2313 03/28/17 0450 03/28/17 0451 03/28/17 0810 03/28/17 1512  HGB  --  9.1*  --   --  9.5*  --   --   --   --  9.4*  --   HCT  --  30.3*  --   --  30.3*  --   --   --   --  31.0*  --   PLT  --  326  --   --  480*  --   --   --   --  681*  --   LABPROT 19.2*  --   --   --  19.8*  --   --  20.2*  --   --   --   INR 1.60  --   --   --  1.66  --   --  1.70  --   --   --   HEPARINUNFRC  --   --   --   --  0.54  --   --   --  0.84*  --  0.63  CREATININE  --  1.15*  < > 1.24*  --  1.24* 1.32*  --   --   --   --   CKTOTAL  --   --   --   --   --  39  --   --   --   --   --   < > = values in this interval not displayed.  Estimated Creatinine Clearance: 46.7 mL/min (A) (by C-G formula based on SCr of 1.32 mg/dL (H)).   Assessment:  Anticoag: Coumadin for Afib PTA and now PVD; Hep IV bridge. HL 0.63 now in goal range. Hgb stable. Plts up to 681  Goal of Therapy:  Heparin level 0.3-0.7 units/ml Monitor platelets by anticoagulation protocol: Yes   Plan:  Continue IV heparin at 1750 units/hr Next HL and CBC in AM   Charvi Gammage S. Alford [place], PharmD, BCPS Clinical Staff Pharmacist Pager [phone]  Eilene Ghazi Stillinger 03/28/2017,4:25 PM

## 2017-03-28 NOTE — Progress Notes (Signed)
PROGRESS NOTE    SHALI VESEY  SWF:[id number] DOB: [birth year] DOA: 03/04/2017 PCP: [name], MD   Chief Complaint  Patient presents with  . Leg Pain    Brief Narrative:  HPI 03/05/2017 by Dr. Tennis Must DENNICE TINDOL is a 76 y.o. female with medical history significant of osteoarthritis, chronic atrial fibrillation, diastolic heart failure on home oxygen, impaired hearing, history of DVT, essential hypertension, hyperlipidemia, hypothyroidism, peripheral neuropathy, polycythemia vera, history of severe peripheral vascular disease with a history of right femoral endarterectomy and femoral to above knee popliteal bypass in 2014, who was followed in early January this year by vascular surgery and at that time ultrasound revealed that her bypass was occluded. She denies history of claudication at that time, but has has a history of lower extremity wounds and ulcerations. She mentions that since Sunday her right lower extremity pain has increased and it looks discolored when compared to the left lower extremity. She denies fever, chills, but feels fatigue. Denies headache, sore throat, worsening chronic dyspnea, productive cough, chest pain, palpitations, dizziness, diaphoresis, lower extremity edema, PND or orthopnea. She denies abdominal pain, nausea, emesis, diarrhea, constipation, melena, hematochezia, dysuria, frequency or hematuria. She denies polyuria, polydipsia or blurred vision.  Interim history Patient underwent bypass on 03/11/2017 and remained in the ICU intubated. Later was placed in stepdown and given diuresis for CHF, however became hypotensive, was placed back in the ICU on pressors. 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 -Vascular surgery not planning on amputation on patient decompensates -continue pain control -Continue Zosyn  Acute diastolic CHF -Echocardiogram as listed below -Chest x-ray and CT abdomen did show evidence of volume overload -Patient currently on continuous Lasix drip -Will monitor intake and output, and daily weights -Urine output over the past 24 hours 1300cc -Weight improved from 98.4 kg to 88.1 kg  Atrial fibrillation -Required amiodarone drip, transitioned to oral  -Continue heparin   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, stage II-III -Creatinine peaked at 2.6 on 03/14/2017 -Upon review of Epic, baseline creatinine approximately 1-1.3 (earlier 2017, creatinine noted to be 1 however late 2017 creatinine 1.2-1.3) -Suspect creatinine worsened secondary to sepsis versus shock and infection along with diuresis for CHF -Creatinine improved 1.24 -Will continue to monitor BMP  Acute on chronic hypoxic respiratory failure -Patient uses approximately 4 L of home oxygen, currently on 5 L -stable, continue pulm toilette  Transaminitis -Secondary to shocked liver -Resolved  Nausea/vomiting -Resolved, continue antiemetics as needed  Malnutrition with anasarca/dysphagia -Speech therapy consulted -Status post modified barium swallow, speech recommendation for regular diet texture with thin liquids  Polycythemia vera/leukocytosis -Leukocytosis can be made worse by polycythemia vera versus patient's active infection with limb ischemia -Continue Hydrea -Continue to monitor CBC -WBC 58.8, trending down  Microcytic Anemia versus anemia secondary to blood loss from recent surgery -Baseline hemoglobin approximately 11-12, currently 9.5 and has been steady for the past several days -She recently had anemia panel  03/03/2017 showed an iron of 15, ferritin of 61 -Continue to monitor CBC  Hypothyroidism -Continue synthroid- will transition to oral form  Hypoglycemia -Improving with D10 infusion -Will attempt to wean as patient  is able to tolerate diet  Klebsiella pneumonia -Noted on respiratory culture -Treated  Delirium -Appears to have resolved  Hypokalemia/Hypomagnesemia -Resolved, continue to monitor BMP  Goals of care -Code status discussed with patient, patient does not wish to be resuscitated or for intubation -Code status changed to DNR  Physical deconditioning -PT/OT consulted and recommended CIR -CIR consulted -Given that patient is still so volume overloaded and on heparin/lasix infusions, feel patient may be better suited at [facility] -Discussed with family and patient at bedside -Case management consulted.   DVT Prophylaxis  heparin  Code Status: DNR  Family Communication: Family at bedside  Disposition Plan: Admitted. Continue to monitor in stepdown. Dispo TBD  Consultants PCCM Vascular surgery  Procedures / significant Events 8/6 Renal US: No evidence of hydronephrosis. Mildly increased renal parenchymal echogenicity may reflect medical renal disease. 8/8 RLE Fem-Pop bypass & returned to ICU 8/8 - 8/17 Intubated/Extubated 8/9 L  CVL 8/10 Echocardiogram: EF 65-70%, no RWA MA. LA & RA severely dilated  8/21 Echocardiogram: EF 60-65%, normal wall motion w/o regional wall abnormality. Indeterminant diastolic function due to atrial fibrillation. LA and RA severely dilated.  Antibiotics   Anti-infectives    Start     Dose/Rate Route Frequency Ordered Stop   03/27/17 1400  piperacillin-tazobactam (ZOSYN) IVPB 3.375 g     3.375 g 12.5 mL/hr over 240 Minutes Intravenous Every 8 hours 03/27/17 0641     03/27/17 0645  piperacillin-tazobactam (ZOSYN) IVPB 3.375 g     3.375 g 100 mL/hr over 30 Minutes Intravenous  Once 03/27/17 0641 03/27/17 0728   03/22/17 2359   vancomycin (VANCOCIN) IVPB 750 mg/150 ml premix  Status:  Discontinued     750 mg 150 mL/hr over 60 Minutes Intravenous Daily at bedtime 03/22/17 2323 03/24/17 0950   03/22/17 0200  ciprofloxacin (CIPRO) IVPB 400 mg  Status:  Discontinued     400 mg 200 mL/hr over 60 Minutes Intravenous Every 12 hours 03/21/17 1529 03/22/17 0930   03/21/17 0700  vancomycin (VANCOCIN) IVPB 750 mg/150 ml premix     750 mg 150 mL/hr over 60 Minutes Intravenous  Once 03/21/17 0656 03/21/17 0845   03/17/17 1100  vancomycin (VANCOCIN) IVPB 750 mg/150 ml premix  Status:  Discontinued     750 mg 150 mL/hr over 60 Minutes Intravenous Every 24 hours 03/16/17 0929 03/19/17 1147   03/16/17 1000  ciprofloxacin (CIPRO) IVPB 400 mg  Status:  Discontinued     400 mg 200 mL/hr over 60 Minutes Intravenous Every 24 hours 03/16/17 0858 03/21/17 1529   03/16/17 1000  vancomycin (VANCOCIN) 2,000 mg in sodium chloride 0.9 % 500 mL IVPB     2,000 mg 250 mL/hr over 120 Minutes Intravenous  Once 03/16/17 0929 03/16/17 1250   03/15/17 1000  cefTRIAXone (ROCEPHIN) 1 g in dextrose 5 % 50 mL IVPB  Status:  Discontinued     1 g 100 mL/hr over 30 Minutes Intravenous Every 24 hours 03/14/17 1054 03/16/17 0841   03/14/17 1100  cefTRIAXone (ROCEPHIN) 1 g in dextrose 5 % 50 mL IVPB  Status:  Discontinued     1 g 100 mL/hr over 30 Minutes Intravenous Every 24 hours 03/14/17 1054 03/14/17 1054   03/13/17 1100  vancomycin (VANCOCIN) 1,250 mg in sodium chloride 0.9 % 250 mL IVPB  Status:  Discontinued     1,250 mg 166.7 mL/hr over 90 Minutes Intravenous Every 24 hours 03/12/17 0937 03/13/17 0741   03/13/17 1100  vancomycin (VANCOCIN) IVPB 1000  mg/200 mL premix  Status:  Discontinued     1,000 mg 200 mL/hr over 60 Minutes Intravenous Every 24 hours 03/13/17 0741 03/14/17 1053   03/12/17 1000  ceFEPIme (MAXIPIME) 2 g in dextrose 5 % 50 mL IVPB  Status:  Discontinued     2 g 100 mL/hr over 30 Minutes Intravenous Every 24 hours 03/12/17 0931  03/14/17 1053   03/12/17 1000  vancomycin (VANCOCIN) 1,750 mg in sodium chloride 0.9 % 500 mL IVPB     1,750 mg 250 mL/hr over 120 Minutes Intravenous  Once 03/12/17 0931 03/12/17 1210   03/11/17 2000  cefUROXime (ZINACEF) 1.5 g in dextrose 5 % 50 mL IVPB     1.5 g 100 mL/hr over 30 Minutes Intravenous Every 12 hours 03/11/17 1955 03/12/17 0930   03/11/17 1245  cefUROXime (ZINACEF) 1.5 g in dextrose 5 % 50 mL IVPB     1.5 g 100 mL/hr over 30 Minutes Intravenous On call to O.R. 03/11/17 1244 03/11/17 1500      Subjective:   Raoul Pitch seen and examined today.   Feeling better this morning with no complaints. Denies chest pain, shortness of breath, abdominal pain, nausea, vomiting, dizziness or headache. Feels some numbness in her toes.   Objective:   Vitals:   03/27/17 2333 03/28/17 0411 03/28/17 0845 03/28/17 1139  BP: 118/81 117/73 125/76 113/73  Pulse: 80 90 85 93  Resp: (!) 25 (!) 23 (!) 27 (!) 27  Temp: (!) 97.3 F (36.3 C) (!) 97.4 F (36.3 C) 97.7 F (36.5 C) (!) 97.5 F (36.4 C)  TempSrc: Oral Oral Oral Oral  SpO2: 97% 95% 97% 98%  Weight:      Height:        Intake/Output Summary (Last 24 hours) at 03/28/17 1625 Last data filed at 03/28/17 1016  Gross per 24 hour  Intake             1104 ml  Output             1300 ml  Net             -196 ml   Filed Weights   03/21/17 0500 03/23/17 0533 03/26/17 1604  Weight: 81.2 kg (179 lb 0.2 oz) 83.9 kg (184 lb 15.5 oz) 88.1 kg (194 lb 3.6 oz)   Exam  General: Well developed, ill-appearing, NAD  HEENT: NCAT, mucous membranes moist.   Cardiovascular: S1 S2 auscultated, irregular  Respiratory: Clear to auscultation bilaterally with equal chest rise  Abdomen: Soft, nontender, nondistended, + bowel sounds  Extremities:  2+ LE and UE edema. Right foot with dry gangrenous toes  Neuro: AAOx3, hard of hearing, nonfocal  Psych: Appropriate  Data Reviewed: I have personally reviewed following labs and imaging  studies  CBC:  Recent Labs Lab 03/24/17 0951 03/25/17 0507 03/25/17 1723 03/26/17 0219 03/27/17 0449 03/28/17 0810  WBC 39.1* 44.1*  --  49.2* 61.4* 58.8*  NEUTROABS  --   --   --   --  59.6* 57.6*  HGB 9.8* 8.8* 9.1* 9.1* 9.5* 9.4*  HCT 32.6* 29.6* 30.3* 30.3* 30.3* 31.0*  MCV 78.7 78.5  --  78.5 78.9 78.7  PLT 226 250  --  326 480* 254*   Basic Metabolic Panel:  Recent Labs Lab 03/22/17 0500 03/23/17 0410  03/24/17 0951 03/25/17 0507  03/26/17 0218 03/26/17 0219 03/26/17 1143 03/26/17 2330 03/27/17 1104 03/27/17 2313  NA 139  --   < > 135 136  < >  --  134* 135 132* 136 135  K 3.0*  --   < > 3.7 3.8  < >  --  3.6 4.0 3.8 3.4* 3.3*  CL 106  --   < > 105 106  < >  --  105 103 101 102 102  CO2 28  --   < > 24 24  < >  --  24 24 22 23 22   GLUCOSE 89  --   < > 98 69  < >  --  75 84 113* 112* 99  BUN 45*  --   < > 30* 27*  < >  --  27* 26* 28* 28* 30*  CREATININE 1.20*  --   < > 1.16* 1.05*  < >  --  1.15* 1.18* 1.24* 1.24* 1.32*  CALCIUM 8.0*  --   < > 8.1* 7.8*  < >  --  7.8* 8.2* 7.9* 8.4* 8.2*  MG 1.6* 1.8  --  1.7 1.7  --  1.7  --  1.8 2.0 1.9 1.8  PHOS 2.5 3.2  --  3.3 3.3  --   --  3.6  --   --   --   --   < > = values in this interval not displayed. GFR: Estimated Creatinine Clearance: 46.7 mL/min (A) (by C-G formula based on SCr of 1.32 mg/dL (H)). Liver Function Tests:  Recent Labs Lab 03/23/17 1052 03/24/17 0951 03/25/17 0507 03/26/17 0219  AST 24  --   --   --   ALT 38  --   --   --   ALKPHOS 64  --   --   --   BILITOT 1.5*  --   --   --   PROT 4.7*  --   --   --   ALBUMIN 2.1* 2.1* 1.9* 2.0*   No results for input(s): LIPASE, AMYLASE in the last 168 hours. No results for input(s): AMMONIA in the last 168 hours. Coagulation Profile:  Recent Labs Lab 03/24/17 0951 03/25/17 0507 03/26/17 0218 03/27/17 0449 03/28/17 0450  INR 1.60 1.62 1.60 1.66 1.70   Cardiac Enzymes:  Recent Labs Lab 03/23/17 1710 03/23/17 2344 03/24/17 0951  03/24/17 1553 03/24/17 2123 03/27/17 1104  CKTOTAL  --   --   --   --   --  39  TROPONINI 0.06* 0.20* 0.09* 0.07* 0.08*  --    BNP (last 3 results) No results for input(s): PROBNP in the last 8760 hours. HbA1C: No results for input(s): HGBA1C in the last 72 hours. CBG:  Recent Labs Lab 03/26/17 2327 03/27/17 0357 03/27/17 0805 03/27/17 1210 03/27/17 1554  GLUCAP 116* 117* 117* 111* 104*   Lipid Profile: No results for input(s): CHOL, HDL, LDLCALC, TRIG, CHOLHDL, LDLDIRECT in the last 72 hours. Thyroid Function Tests:  Recent Labs  03/26/17 1143  FREET4 1.29*   Anemia Panel: No results for input(s): VITAMINB12, FOLATE, FERRITIN, TIBC, IRON, RETICCTPCT in the last 72 hours. Urine analysis:    Component Value Date/Time   COLORURINE YELLOW 03/09/2017 1648   APPEARANCEUR CLEAR 03/09/2017 1648   LABSPEC 1.021 03/09/2017 1648   PHURINE 5.0 03/09/2017 1648   GLUCOSEU NEGATIVE 03/09/2017 1648   HGBUR MODERATE (A) 03/09/2017 1648   BILIRUBINUR NEGATIVE 03/09/2017 1648   KETONESUR NEGATIVE 03/09/2017 1648   PROTEINUR NEGATIVE 03/09/2017 1648   UROBILINOGEN 1.0 09/14/2012 2337   NITRITE NEGATIVE 03/09/2017 1648   LEUKOCYTESUR NEGATIVE 03/09/2017 1648   Sepsis Labs: @LABRCNTIP (procalcitonin:4,lacticidven:4)  ) Recent Results (from  the past 240 hour(s))  Culture, blood (Routine X 2) w Reflex to ID Panel     Status: None (Preliminary result)   Collection Time: 03/27/17  7:05 AM  Result Value Ref Range Status   Specimen Description BLOOD LEFT HAND  Final   Special Requests   Final    BOTTLES DRAWN AEROBIC AND ANAEROBIC Blood Culture results may not be optimal due to an inadequate volume of blood received in culture bottles   Culture NO GROWTH 1 DAY  Final   Report Status PENDING  Incomplete  Culture, blood (Routine X 2) w Reflex to ID Panel     Status: None (Preliminary result)   Collection Time: 03/27/17  7:12 AM  Result Value Ref Range Status   Specimen Description  BLOOD RIGHT HAND  Final   Special Requests IN PEDIATRIC BOTTLE Blood Culture adequate volume  Final   Culture NO GROWTH 1 DAY  Final   Report Status PENDING  Incomplete      Radiology Studies: Dg Chest Port 1 View  Result Date: 03/28/2017 CLINICAL DATA:  Respiratory difficulty EXAM: PORTABLE CHEST 1 VIEW COMPARISON:  March 25, 2017 FINDINGS: Moderate to large bilateral pleural effusions and underlying opacities are similar in the interval. A left central line terminates in the central SVC, unchanged. No change in the cardiomediastinal silhouette. Pulmonary edema is stable. IMPRESSION: Pulmonary edema and moderate to large bilateral pleural effusions with underlying opacities. The findings are similar in the interval. Electronically Signed   By: Dorise Bullion III M.D   On: 03/28/2017 11:49   Dg Swallowing Func-speech Pathology  Result Date: 03/27/2017 Objective Swallowing Evaluation: Type of Study: MBS-Modified Barium Swallow Study Patient Details Name: JOYLEEN HASELTON MRN: [id number] Date of Birth: [birth year] Today's Date: 03/27/2017 Time: SLP Start Time (ACUTE ONLY): 0850-SLP Stop Time (ACUTE ONLY): 0910 SLP Time Calculation (min) (ACUTE ONLY): 20 min Past Medical History: Past Medical History: Diagnosis Date . Arthritis  . Atrial fibrillation, chronic (HCC)   a. on Coumadin for anticoagulation.  . CHF (congestive heart failure) ([place])  . Deaf  . Diastolic heart failure ([place])  . DVT (deep venous thrombosis) ([place])  . Essential hypertension  . Hyperlipidemia  . Hypothyroidism  . On home O2  . Peripheral neuropathy  . Peripheral vascular disease ([place])  . Polycythemia vera(238.4) 10/01/2011 . Ulcer of ankle ([place])   Bilateral 2015 . Varicose veins  Past Surgical History: Past Surgical History: Procedure Laterality Date . ABDOMINAL AORTAGRAM N/A 09/14/2012  Procedure: ABDOMINAL [name];  Surgeon: Serafina Mitchell, MD;  Location: [facility] CATH LAB;  Service: Cardiovascular;  Laterality: N/A; . ABDOMINAL AORTOGRAM  N/A 03/06/2017  Procedure: ABDOMINAL AORTOGRAM;  Surgeon: Elam Dutch, MD;  Location: [place] CV LAB;  Service: Cardiovascular;  Laterality: N/A; . ABDOMINAL HYSTERECTOMY   . CORONARY ANGIOPLASTY   . DRESSING CHANGE UNDER ANESTHESIA  12/02/2011  Procedure: DRESSING CHANGE UNDER ANESTHESIA;  Surgeon: Carole Civil, MD;  Location: [facility];  Service: Orthopedics;  Laterality: Right; . ENDARTERECTOMY FEMORAL Right 09/15/2012  Procedure: ENDARTERECTOMY FEMORAL;  Surgeon: Mal Misty, MD;  Location: [place];  Service: Vascular;  Laterality: Right; . FASCIOTOMY  11/29/2011  Procedure: FASCIOTOMY;  Surgeon: Carole Civil, MD;  Location: [facility];  Service: Orthopedics;  Laterality: Right;  right thigh  . FEMORAL-POPLITEAL BYPASS GRAFT Right 09/15/2012  Procedure: BYPASS GRAFT FEMORAL-POPLITEAL ARTERY;  Surgeon: Mal Misty, MD;  Location: [place];  Service: Vascular;  Laterality: Right; . FEMORAL-TIBIAL BYPASS GRAFT Right 03/11/2017  Procedure: RIGHT FEMORAL-TIBIAL BYPASS IN-SITU GREATER SAPHENOUS VEIN;  Surgeon: Waynetta Sandy, MD;  Location: [place];  Service: Vascular;  Laterality: Right; . HIP PINNING,CANNULATED  11/19/2011  Procedure: CANNULATED HIP PINNING;  Surgeon: Sanjuana Kava, MD;  Location: [facility];  Service: Orthopedics;  Laterality: Right; . LOWER EXTREMITY ANGIOGRAPHY Bilateral 03/06/2017  Procedure: Lower Extremity Angiography;  Surgeon: Elam Dutch, MD;  Location: [place] CV LAB;  Service: Cardiovascular;  Laterality: Bilateral; . PATCH ANGIOPLASTY Right 09/15/2012  Procedure: PATCH ANGIOPLASTY;  Surgeon: Mal Misty, MD;  Location: [place];  Service: Vascular;  Laterality: Right; . TUBAL LIGATION   HPI: 76 year old female admitted 03/04/17 with right leg pain and discoloration, found to have pneumonia, septic shock. PMh significant for AFib, diastolic heart failure, HTN, HOH, PVD, polycysthemia. Pt underwent R fem to tp trunk bypass 03/11/17. She was intubated from 8/8-8/17. Chest  x-ray 8/18 shows Lung opacities consistent with a combination of bilateral pleural effusions with pulmonary edema, atelectasis or a combination. Perihilar lung opacity appears mildly increased from the most recent prior study suggesting worsened pulmonary edema. Subjective: pt alert, upright in chair Assessment / Plan / Recommendation CHL IP CLINICAL IMPRESSIONS 03/27/2017 Clinical Impression Pt's swallow function has significantly improved. Pharyngeal constriction, laryngeal elevation and epiglottic inversion were within functional limits with minimal-mild residue with thicker consistencies not increasing pt's risk. No laryngeal penetration or aspiration observed. Barium pill hesitated in the vallecula with bite applesauce assisting transit to stomach. Recommend regular diet texture, thin liquids, straws allowed, pills with liquids. ST to follow briefly to ensure safey.    SLP Visit Diagnosis Dysphagia, pharyngeal phase (R13.13) Attention and concentration deficit following -- Frontal lobe and executive function deficit following -- Impact on safety and function Mild aspiration risk   CHL IP TREATMENT RECOMMENDATION 03/27/2017 Treatment Recommendations Therapy as outlined in treatment plan below   Prognosis 03/27/2017 Prognosis for Safe Diet Advancement Good Barriers to Reach Goals -- Barriers/Prognosis Comment -- CHL IP DIET RECOMMENDATION 03/27/2017 SLP Diet Recommendations Regular solids;Thin liquid Liquid Administration via Straw;Cup Medication Administration Whole meds with liquid Compensations Slow rate;Small sips/bites;Minimize environmental distractions Postural Changes Seated upright at 90 degrees   CHL IP OTHER RECOMMENDATIONS 03/27/2017 Recommended Consults -- Oral Care Recommendations Oral care BID Other Recommendations --   CHL IP FOLLOW UP RECOMMENDATIONS 03/27/2017 Follow up Recommendations None   CHL IP FREQUENCY AND DURATION 03/27/2017 Speech Therapy Frequency (ACUTE ONLY) min 2x/week Treatment Duration 2  weeks      CHL IP ORAL PHASE 03/27/2017 Oral Phase WFL Oral - Pudding Teaspoon -- Oral - Pudding Cup -- Oral - Honey Teaspoon -- Oral - Honey Cup -- Oral - Nectar Teaspoon -- Oral - Nectar Cup -- Oral - Nectar Straw -- Oral - Thin Teaspoon -- Oral - Thin Cup -- Oral - Thin Straw -- Oral - Puree -- Oral - Mech Soft -- Oral - Regular -- Oral - Multi-Consistency -- Oral - Pill -- Oral Phase - Comment --  CHL IP PHARYNGEAL PHASE 03/27/2017 Pharyngeal Phase Impaired Pharyngeal- Pudding Teaspoon -- Pharyngeal -- Pharyngeal- Pudding Cup -- Pharyngeal -- Pharyngeal- Honey Teaspoon -- Pharyngeal -- Pharyngeal- Honey Cup Pharyngeal residue - valleculae;Pharyngeal residue - pyriform Pharyngeal -- Pharyngeal- Nectar Teaspoon -- Pharyngeal -- Pharyngeal- Nectar Cup Pharyngeal residue - valleculae;Pharyngeal residue - pyriform Pharyngeal Material does not enter airway Pharyngeal- Nectar Straw NT Pharyngeal -- Pharyngeal- Thin Teaspoon NT Pharyngeal -- Pharyngeal- Thin Cup [facility] Pharyngeal Material does not enter airway Pharyngeal- Thin Straw WFL Pharyngeal --  Pharyngeal- Puree NT Pharyngeal -- Pharyngeal- Mechanical Soft -- Pharyngeal -- Pharyngeal- Regular WFL Pharyngeal -- Pharyngeal- Multi-consistency -- Pharyngeal -- Pharyngeal- Pill Pharyngeal residue - valleculae;Reduced tongue base retraction Pharyngeal -- Pharyngeal Comment --  CHL IP CERVICAL ESOPHAGEAL PHASE 03/27/2017 Cervical Esophageal Phase WFL Pudding Teaspoon -- Pudding Cup -- Honey Teaspoon -- Honey Cup -- Nectar Teaspoon -- Nectar Cup -- Nectar Straw -- Thin Teaspoon -- Thin Cup -- Thin Straw -- Puree -- Mechanical Soft -- Regular -- Multi-consistency -- Pill -- Cervical Esophageal Comment -- CHL IP GO 02/13/2017 Functional Assessment Tool Used clinical judgement Functional Limitations Swallowing Swallow Current Status (V4008) CI Swallow Goal Status (Q7619) CI Swallow Discharge Status (J0932) CI Motor Speech Current Status (I7124) (None) Motor Speech Goal Status  (P8099) (None) Motor Speech Goal Status (I3382) (None) Spoken Language Comprehension Current Status (N0539) (None) Spoken Language Comprehension Goal Status (J6734) (None) Spoken Language Comprehension Discharge Status (L9379) (None) Spoken Language Expression Current Status (K2409) (None) Spoken Language Expression Goal Status (B3532) (None) Spoken Language Expression Discharge Status [phone]) (None) Attention Current Status (A8341) (None) Attention Goal Status (D6222) (None) Attention Discharge Status (L7989) (None) Memory Current Status (Q1194) (None) Memory Goal Status (R7408) (None) Memory Discharge Status (X4481) (None) Voice Current Status (E5631) (None) Voice Goal Status (S9702) (None) Voice Discharge Status (O3785) (None) Other Speech-Language Pathology Functional Limitation Current Status (Y8502) (None) Other Speech-Language Pathology Functional Limitation Goal Status (D7412) (None) Other Speech-Language Pathology Functional Limitation Discharge Status [phone]) (None) [place] Siren 03/27/2017, 9:47 AM Orbie Pyo Colvin Caroli.Ed CCC-SLP Pager [phone]                Scheduled Meds: . amiodarone  200 mg Oral BID  . aspirin  81 mg Oral Daily  . Chlorhexidine Gluconate Cloth  6 each Topical Daily  . gabapentin  100 mg Oral Q8H  . Gerhardt's butt cream   Topical Daily  . hydrocortisone sod succinate (SOLU-CORTEF) inj  50 mg Intravenous Q6H  . hydroxyurea  1,000 mg Oral Daily  . levothyroxine  37.5 mcg Intravenous Daily  . mouth rinse  15 mL Mouth Rinse BID  . midodrine  5 mg Oral TID WC  . pantoprazole  20 mg Oral Q1200  . sodium chloride flush  10-40 mL Intracatheter Q12H   Continuous Infusions: . dextrose 10 mL/hr at 03/26/17 0800  . furosemide (LASIX) infusion 8 mg/hr (03/28/17 0400)  . heparin 1,750 Units/hr (03/28/17 1406)  . piperacillin-tazobactam (ZOSYN)  IV 3.375 g (03/28/17 1435)     LOS: 24 days   Time Spent in minutes   30 minutes  Kaylise Blakeley D.O. on 03/28/2017 at  4:25 PM  Between 7am to 7pm - Pager - [phone]  After 7pm go to [email or web address] - password [email or web address]  And look for the night coverage person covering for me after hours  [facility] Office  [phone]

## 2017-03-29 ENCOUNTER — Other Ambulatory Visit
Admission: RE | Admit: 2017-03-29 | Discharge: 2017-03-29 | Disposition: A | Discharge status: Home / Self Care | Payer: Medicare Other | Source: Ambulatory Visit | Attending: Internal Medicine | Admitting: Internal Medicine

## 2017-03-29 DIAGNOSIS — R197 Diarrhea, unspecified: Secondary | ICD-10-CM | POA: Diagnosis not present

## 2017-03-29 DIAGNOSIS — I482 Chronic atrial fibrillation: Secondary | ICD-10-CM | POA: Diagnosis not present

## 2017-03-29 DIAGNOSIS — D72829 Elevated white blood cell count, unspecified: Secondary | ICD-10-CM | POA: Diagnosis present

## 2017-03-29 DIAGNOSIS — J449 Chronic obstructive pulmonary disease, unspecified: Secondary | ICD-10-CM | POA: Diagnosis not present

## 2017-03-29 DIAGNOSIS — L03119 Cellulitis of unspecified part of limb: Secondary | ICD-10-CM | POA: Diagnosis not present

## 2017-03-29 DIAGNOSIS — Z452 Encounter for adjustment and management of vascular access device: Secondary | ICD-10-CM | POA: Diagnosis not present

## 2017-03-29 DIAGNOSIS — R6521 Severe sepsis with septic shock: Secondary | ICD-10-CM | POA: Diagnosis not present

## 2017-03-29 DIAGNOSIS — I472 Ventricular tachycardia: Secondary | ICD-10-CM | POA: Diagnosis not present

## 2017-03-29 DIAGNOSIS — I130 Hypertensive heart and chronic kidney disease with heart failure and stage 1 through stage 4 chronic kidney disease, or unspecified chronic kidney disease: Secondary | ICD-10-CM | POA: Diagnosis present

## 2017-03-29 DIAGNOSIS — J9601 Acute respiratory failure with hypoxia: Secondary | ICD-10-CM | POA: Diagnosis not present

## 2017-03-29 DIAGNOSIS — D45 Polycythemia vera: Secondary | ICD-10-CM | POA: Diagnosis not present

## 2017-03-29 DIAGNOSIS — E038 Other specified hypothyroidism: Secondary | ICD-10-CM | POA: Diagnosis not present

## 2017-03-29 DIAGNOSIS — E876 Hypokalemia: Secondary | ICD-10-CM | POA: Diagnosis not present

## 2017-03-29 DIAGNOSIS — J440 Chronic obstructive pulmonary disease with acute lower respiratory infection: Secondary | ICD-10-CM | POA: Diagnosis present

## 2017-03-29 DIAGNOSIS — R5381 Other malaise: Secondary | ICD-10-CM | POA: Diagnosis not present

## 2017-03-29 DIAGNOSIS — J189 Pneumonia, unspecified organism: Secondary | ICD-10-CM | POA: Diagnosis not present

## 2017-03-29 DIAGNOSIS — I70261 Atherosclerosis of native arteries of extremities with gangrene, right leg: Secondary | ICD-10-CM | POA: Diagnosis not present

## 2017-03-29 DIAGNOSIS — I5032 Chronic diastolic (congestive) heart failure: Secondary | ICD-10-CM | POA: Diagnosis not present

## 2017-03-29 DIAGNOSIS — I998 Other disorder of circulatory system: Secondary | ICD-10-CM | POA: Diagnosis not present

## 2017-03-29 DIAGNOSIS — Z9911 Dependence on respirator [ventilator] status: Secondary | ICD-10-CM | POA: Diagnosis not present

## 2017-03-29 DIAGNOSIS — G629 Polyneuropathy, unspecified: Secondary | ICD-10-CM | POA: Diagnosis not present

## 2017-03-29 DIAGNOSIS — N289 Disorder of kidney and ureter, unspecified: Secondary | ICD-10-CM | POA: Diagnosis present

## 2017-03-29 DIAGNOSIS — J150 Pneumonia due to Klebsiella pneumoniae: Secondary | ICD-10-CM | POA: Diagnosis not present

## 2017-03-29 DIAGNOSIS — R262 Difficulty in walking, not elsewhere classified: Secondary | ICD-10-CM | POA: Diagnosis not present

## 2017-03-29 DIAGNOSIS — I5033 Acute on chronic diastolic (congestive) heart failure: Secondary | ICD-10-CM | POA: Diagnosis present

## 2017-03-29 DIAGNOSIS — I2510 Atherosclerotic heart disease of native coronary artery without angina pectoris: Secondary | ICD-10-CM | POA: Diagnosis present

## 2017-03-29 DIAGNOSIS — J90 Pleural effusion, not elsewhere classified: Secondary | ICD-10-CM | POA: Diagnosis not present

## 2017-03-29 DIAGNOSIS — I5043 Acute on chronic combined systolic (congestive) and diastolic (congestive) heart failure: Secondary | ICD-10-CM | POA: Diagnosis not present

## 2017-03-29 DIAGNOSIS — I70621 Atherosclerosis of nonbiological bypass graft(s) of the extremities with rest pain, right leg: Secondary | ICD-10-CM | POA: Diagnosis not present

## 2017-03-29 DIAGNOSIS — J9620 Acute and chronic respiratory failure, unspecified whether with hypoxia or hypercapnia: Secondary | ICD-10-CM | POA: Diagnosis not present

## 2017-03-29 DIAGNOSIS — I739 Peripheral vascular disease, unspecified: Secondary | ICD-10-CM | POA: Diagnosis not present

## 2017-03-29 DIAGNOSIS — E785 Hyperlipidemia, unspecified: Secondary | ICD-10-CM | POA: Diagnosis not present

## 2017-03-29 DIAGNOSIS — L03115 Cellulitis of right lower limb: Secondary | ICD-10-CM | POA: Diagnosis not present

## 2017-03-29 DIAGNOSIS — S81802A Unspecified open wound, left lower leg, initial encounter: Secondary | ICD-10-CM | POA: Diagnosis not present

## 2017-03-29 DIAGNOSIS — J9621 Acute and chronic respiratory failure with hypoxia: Secondary | ICD-10-CM | POA: Diagnosis not present

## 2017-03-29 DIAGNOSIS — R0902 Hypoxemia: Secondary | ICD-10-CM | POA: Diagnosis not present

## 2017-03-29 DIAGNOSIS — I70269 Atherosclerosis of native arteries of extremities with gangrene, unspecified extremity: Secondary | ICD-10-CM | POA: Diagnosis not present

## 2017-03-29 DIAGNOSIS — E46 Unspecified protein-calorie malnutrition: Secondary | ICD-10-CM | POA: Diagnosis present

## 2017-03-29 DIAGNOSIS — M79604 Pain in right leg: Secondary | ICD-10-CM | POA: Diagnosis not present

## 2017-03-29 DIAGNOSIS — J168 Pneumonia due to other specified infectious organisms: Secondary | ICD-10-CM | POA: Diagnosis not present

## 2017-03-29 DIAGNOSIS — E039 Hypothyroidism, unspecified: Secondary | ICD-10-CM | POA: Diagnosis not present

## 2017-03-29 DIAGNOSIS — A419 Sepsis, unspecified organism: Secondary | ICD-10-CM | POA: Diagnosis not present

## 2017-03-29 DIAGNOSIS — T814XXA Infection following a procedure, initial encounter: Secondary | ICD-10-CM | POA: Diagnosis not present

## 2017-03-29 DIAGNOSIS — D649 Anemia, unspecified: Secondary | ICD-10-CM | POA: Diagnosis not present

## 2017-03-29 DIAGNOSIS — M6281 Muscle weakness (generalized): Secondary | ICD-10-CM | POA: Diagnosis not present

## 2017-03-29 DIAGNOSIS — I10 Essential (primary) hypertension: Secondary | ICD-10-CM | POA: Diagnosis not present

## 2017-03-29 DIAGNOSIS — R600 Localized edema: Secondary | ICD-10-CM | POA: Diagnosis not present

## 2017-03-29 DIAGNOSIS — N179 Acute kidney failure, unspecified: Secondary | ICD-10-CM | POA: Diagnosis not present

## 2017-03-29 DIAGNOSIS — I5030 Unspecified diastolic (congestive) heart failure: Secondary | ICD-10-CM | POA: Diagnosis not present

## 2017-03-29 DIAGNOSIS — N189 Chronic kidney disease, unspecified: Secondary | ICD-10-CM | POA: Diagnosis present

## 2017-03-29 DIAGNOSIS — R0603 Acute respiratory distress: Secondary | ICD-10-CM

## 2017-03-29 DIAGNOSIS — I4891 Unspecified atrial fibrillation: Secondary | ICD-10-CM | POA: Diagnosis not present

## 2017-03-29 DIAGNOSIS — R040 Epistaxis: Secondary | ICD-10-CM | POA: Diagnosis not present

## 2017-03-29 LAB — CBC
HEMATOCRIT: 31.3 % — AB (ref 36.0–46.0)
Hemoglobin: 9.3 g/dL — ABNORMAL LOW (ref 12.0–15.0)
MCH: 23.8 pg — ABNORMAL LOW (ref 26.0–34.0)
MCHC: 29.7 g/dL — ABNORMAL LOW (ref 30.0–36.0)
MCV: 80.3 fL (ref 78.0–100.0)
Platelets: 727 10*3/uL — ABNORMAL HIGH (ref 150–400)
RBC: 3.9 MIL/uL (ref 3.87–5.11)
RDW: 29.8 % — AB (ref 11.5–15.5)
WBC: 48.6 10*3/uL — AB (ref 4.0–10.5)

## 2017-03-29 LAB — APTT: aPTT: 146 seconds — ABNORMAL HIGH (ref 24–36)

## 2017-03-29 LAB — BASIC METABOLIC PANEL
ANION GAP: 12 (ref 5–15)
BUN: 30 mg/dL — AB (ref 6–20)
CO2: 23 mmol/L (ref 22–32)
Calcium: 8 mg/dL — ABNORMAL LOW (ref 8.9–10.3)
Chloride: 102 mmol/L (ref 101–111)
Creatinine, Ser: 1.41 mg/dL — ABNORMAL HIGH (ref 0.44–1.00)
GFR calc Af Amer: 41 mL/min — ABNORMAL LOW (ref >60)
GFR, EST NON AFRICAN AMERICAN: 36 mL/min — AB (ref >60)
Glucose, Bld: 107 mg/dL — ABNORMAL HIGH (ref 65–99)
POTASSIUM: 2.4 mmol/L — AB (ref 3.5–5.1)
SODIUM: 137 mmol/L (ref 135–145)

## 2017-03-29 LAB — MAGNESIUM: Magnesium: 1.6 mg/dL — ABNORMAL LOW (ref 1.7–2.4)

## 2017-03-29 LAB — PROTIME-INR
INR: 1.59
Prothrombin Time: 19.1 seconds — ABNORMAL HIGH (ref 11.4–15.2)

## 2017-03-29 LAB — HEPARIN LEVEL (UNFRACTIONATED): HEPARIN UNFRACTIONATED: 0.61 [IU]/mL (ref 0.30–0.70)

## 2017-03-29 MED ORDER — ORAL CARE MOUTH RINSE: 15.0000 mL | Freq: Two times a day (BID) | OROMUCOSAL | 0 refills | Status: DC

## 2017-03-29 MED ORDER — AMIODARONE HCL 200 MG PO TABS: 200.0000 mg | ORAL_TABLET | Freq: Two times a day (BID) | ORAL | Status: DC

## 2017-03-29 MED ORDER — POTASSIUM CHLORIDE 20 MEQ/15ML (10%) PO SOLN: 40.0000 meq | Freq: Every day | ORAL | 0 refills | Status: DC

## 2017-03-29 MED ORDER — MORPHINE SULFATE (PF) 4 MG/ML IV SOLN: 2.0000 mg | INTRAVENOUS | 0 refills | Status: DC | PRN

## 2017-03-29 MED ORDER — PANTOPRAZOLE SODIUM 20 MG PO TBEC: 20.0000 mg | DELAYED_RELEASE_TABLET | Freq: Every day | ORAL | Status: DC

## 2017-03-29 MED ORDER — ACETAMINOPHEN 325 MG PO TABS: 650.0000 mg | ORAL_TABLET | Freq: Four times a day (QID) | ORAL | Status: DC | PRN

## 2017-03-29 MED ORDER — LORAZEPAM 2 MG/ML IJ SOLN
0.5000 mg | Freq: Four times a day (QID) | INTRAMUSCULAR | Status: DC | PRN
  Administered 2017-03-29: 0.5 mg via INTRAVENOUS

## 2017-03-29 MED ORDER — HEPARIN (PORCINE) IN NACL 100-0.45 UNIT/ML-% IJ SOLN: 1750.0000 [IU]/h | INTRAMUSCULAR | Status: DC

## 2017-03-29 MED ORDER — POTASSIUM CHLORIDE CRYS ER 15 MEQ PO TBCR: 30.0000 meq | EXTENDED_RELEASE_TABLET | Freq: Two times a day (BID) | ORAL | Status: DC

## 2017-03-29 MED ORDER — LORAZEPAM 2 MG/ML IJ SOLN
INTRAMUSCULAR | Status: AC
  Filled 2017-03-29: qty 1

## 2017-03-29 MED ORDER — ONDANSETRON HCL 4 MG PO TABS: 4.0000 mg | ORAL_TABLET | Freq: Four times a day (QID) | ORAL | 0 refills | Status: DC | PRN

## 2017-03-29 MED ORDER — LEVOTHYROXINE SODIUM 75 MCG PO TABS: 75.0000 ug | ORAL_TABLET | Freq: Every day | ORAL | Status: DC

## 2017-03-29 MED ORDER — POTASSIUM CHLORIDE 10 MEQ/100ML IV SOLN: 10.0000 meq | INTRAVENOUS | Status: DC

## 2017-03-29 MED ORDER — MAGNESIUM SULFATE 2 GM/50ML IV SOLN
2.0000 g | Freq: Once | INTRAVENOUS | Status: AC
  Administered 2017-03-29: 2 g via INTRAVENOUS
  Filled 2017-03-29: qty 50

## 2017-03-29 MED ORDER — MIDODRINE HCL 5 MG PO TABS: 5.0000 mg | ORAL_TABLET | Freq: Three times a day (TID) | ORAL | Status: DC

## 2017-03-29 MED ORDER — POTASSIUM CHLORIDE 10 MEQ/50ML IV SOLN
10.0000 meq | INTRAVENOUS | Status: AC
  Administered 2017-03-29 (×3): 10 meq via INTRAVENOUS
  Filled 2017-03-29 (×3): qty 50

## 2017-03-29 MED ORDER — FUROSEMIDE 10 MG/ML IJ SOLN: 8.0000 mg/h | INTRAVENOUS | Status: DC

## 2017-03-29 MED ORDER — LORAZEPAM 2 MG/ML IJ SOLN: 0.5000 mg | Freq: Four times a day (QID) | INTRAMUSCULAR | 0 refills | Status: DC | PRN

## 2017-03-29 MED ORDER — POTASSIUM CHLORIDE CRYS ER 20 MEQ PO TBCR
30.0000 meq | EXTENDED_RELEASE_TABLET | Freq: Two times a day (BID) | ORAL | Status: DC
  Administered 2017-03-29: 30 meq via ORAL
  Filled 2017-03-29: qty 1

## 2017-03-29 MED ORDER — POTASSIUM CHLORIDE 20 MEQ/15ML (10%) PO SOLN
40.0000 meq | Freq: Every day | ORAL | Status: DC
  Administered 2017-03-29: 40 meq via ORAL
  Filled 2017-03-29: qty 30

## 2017-03-29 MED ORDER — HYDROCORTISONE NA SUCCINATE PF 100 MG IJ SOLR: 50.0000 mg | Freq: Four times a day (QID) | INTRAMUSCULAR | Status: DC

## 2017-03-29 MED ORDER — GERHARDT'S BUTT CREAM: 1.0000 "application " | TOPICAL_CREAM | Freq: Every day | CUTANEOUS | Status: DC

## 2017-03-29 MED ORDER — POTASSIUM CHLORIDE 10 MEQ/50ML IV SOLN
INTRAVENOUS | Status: AC
  Administered 2017-03-29: 50 meq
  Filled 2017-03-29: qty 50

## 2017-03-29 MED ORDER — PIPERACILLIN-TAZOBACTAM 3.375 G IVPB: 3.3750 g | Freq: Three times a day (TID) | INTRAVENOUS | Status: DC

## 2017-03-29 NOTE — Progress Notes (Signed)
ANTICOAGULATION CONSULT NOTE - Follow Up Consult  Pharmacy Consult for Heparin Indication:  Afib and PVD  No Known Allergies  Patient Measurements: Height: 6' (182.9 cm) Weight: 189 lb 9.5 oz (86 kg) IBW/kg (Calculated) : 73.1 Heparin Dosing Weight:  88 kg  Vital Signs: Temp: 97.3 F (36.3 C) (08/26 0727) Temp Source: Oral (08/26 0727) BP: 110/74 (08/26 0727) Pulse Rate: 90 (08/26 0727)  Labs:  Recent Labs  03/27/17 0449 03/27/17 1104 03/27/17 2313 03/28/17 0450 03/28/17 0451 03/28/17 0810 03/28/17 1512 03/29/17 0500  HGB 9.5*  --   --   --   --  9.4*  --  9.3*  HCT 30.3*  --   --   --   --  31.0*  --  31.3*  PLT 480*  --   --   --   --  681*  --  727*  LABPROT 19.8*  --   --  20.2*  --   --   --  19.1*  INR 1.66  --   --  1.70  --   --   --  1.59  HEPARINUNFRC 0.54  --   --   --  0.84*  --  0.63 0.61  CREATININE  --  1.24* 1.32*  --   --   --   --  1.41*  CKTOTAL  --  39  --   --   --   --   --   --     Estimated Creatinine Clearance: 40.4 mL/min (A) (by C-G formula based on SCr of 1.41 mg/dL (H)).   Assessment: 54 YOF with history of Afib on Coumadin PTA.  She also has PVD requiring surgery; therefore, patient was transitioned to IV heparin bridge.  Heparin level is therapeutic; no bleeding reported.  Hemoglobin low but stable.  Platelet count continues to rise and she is on hydroxyurea.   Goal of Therapy:  Heparin level 0.3-0.7 units/ml Monitor platelets by anticoagulation protocol: Yes    Plan:  Continue heparin gtt at 1750 units/hr Daily heparin level and CBC Watch INR, lytes   Aivah Putman D. Mina Marble, PharmD, BCPS Pager:  [phone] 03/29/2017, 10:38 AM

## 2017-03-29 NOTE — Discharge Summary (Signed)
Physician Discharge Summary  Charlotte [name]:[id number] DOB: 1941-05-22 DOA: 03/04/2017  PCP: [name], MD  Admit date: 03/04/2017 Discharge date: 03/29/2017  Time spent: 45 minutes  Recommendations for Outpatient Follow-up:  Patient will be discharged to [facility]).  Will need follow up with PCP and vascular surgery after discharge. Patient continues to be on lasix and heparin infusions.  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, stage II-III Acute on chronic hypoxic respiratory failure Transaminitis Nausea/vomiting Malnutrition with anasarca/dysphagia Polycythemia vera/leukocytosis Microcytic Anemia versus anemia secondary to blood loss from recent surgery Hypothyroidism Hypoglycemia Klebsiella pneumonia Delirium Hypokalemia/Hypomagnesemia Goals of care Physical deconditioning  Discharge Condition: Stable  Diet recommendation: Regular  Filed Weights   03/23/17 0533 03/26/17 1604 03/29/17 0348  Weight: 83.9 kg (184 lb 15.5 oz) 88.1 kg (194 lb 3.6 oz) 86 kg (189 lb 9.5 oz)    History of present illness:  03/05/2017 by Dr. [name] a 76 y.o.femalewith medical history significant of osteoarthritis, chronic atrial fibrillation, diastolic heart failure on home oxygen, impaired hearing, history of DVT, essential hypertension,hyperlipidemia,hypothyroidism, peripheral neuropathy, polycythemia vera, history of severe peripheral vascular disease with a history of right femoral endarterectomy and femoral to above knee popliteal bypass in 2014, who was followed in early January this year by vascular surgery and at that time ultrasound revealed that her bypass was occluded. She denies history of claudication at that time, but has has a history of lower extremity wounds and ulcerations. She mentions that since Sunday her right lower extremity  pain has increased and it looks discolored when compared to the left lower extremity.She denies fever, chills, but feels fatigue. Denies headache, sore throat, worsening chronic dyspnea, productive cough, chest pain, palpitations, dizziness, diaphoresis, lower extremity edema, PND or orthopnea. She denies abdominal pain, nausea, emesis, diarrhea, constipation, melena, hematochezia, dysuria, frequency or hematuria. She denies polyuria, polydipsia or blurred vision.  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 [name], vascular surgery  Acute diastolic CHF -Echocardiogram as listed below -Chest x-ray and CT abdomen did show evidence of volume overload -Patient currently on continuous Lasix drip (Prior to infusion, patient was lasix 40mg  IV q8h, then tried on 20mg  IV q6h , and required repeat ICU admission for pressors.  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, stage II-III -Creatinine peaked at 2.6  on 03/14/2017 -Upon review of Epic, baseline creatinine approximately 1-1.3 (earlier 2017, creatinine noted to be 1 however late 2017 creatinine 1.2-1.3) -Suspect creatinine worsened secondary to sepsis versus shock and infection along with diuresis for CHF -Creatinine improved 1.41 (mild  increase however on lasix infusion)  Acute on chronic hypoxic respiratory failure -Patient uses approximately 4 L of home oxygen, currently on 5 L -stable, continue pulm toilette  Transaminitis -Secondary to shocked liver -Resolved  Nausea/vomiting -Resolved, continue antiemetics as needed  Malnutrition with anasarca/dysphagia -Speech therapy consulted -Status post modified barium swallow, speech recommendation for regular diet texture with thin liquids  Polycythemia vera/leukocytosis -Leukocytosis can be made worse by polycythemia vera versus patient's active infection with limb ischemia -Continue Hydrea -WBC 48.6, trending down  Microcytic Anemia versus anemia secondary to blood loss from recent surgery -Baseline hemoglobin approximately 11-12, currently 9.3 and has been steady for the past several days -She recently had anemia panel 03/03/2017 showed an iron of 15, ferritin of 61  Hypothyroidism -Continue synthroid- transitioned to oral form  Hypoglycemia -Improvedwith D10 infusion- discontinued as patient tolerating diet  Klebsiella pneumonia -Noted on respiratory culture -Treated  Delirium -Appears to have resolved  Hypokalemia/Hypomagnesemia -Replacing, continue to monitor periodically   Goals of care -Code status discussed with patient, patient does not wish to be resuscitated or for intubation -Code status changed to DNR  Physical deconditioning -PT/OT consulted and recommended CIR -CIR consulted -Given that patient is still so volume overloaded and on heparin/lasix infusions, feel patient may be better suited at [facility] -Discussed with family and patient at  bedside -Case management consulted.   CODE status: DNR  Consultants PCCM Vascular surgery  Procedures / significant Events 8/6 Renal US: No evidence of hydronephrosis. Mildly increased renal parenchymal echogenicity may reflect medical renal disease. 8/8 RLE Fem-Pop bypass & returned to ICU 8/8 - 8/17 Intubated/Extubated 8/9 L [place] CVL 8/10 Echocardiogram: EF 65-70%, no RWA MA. LA & RA severely dilated  8/21 Echocardiogram: EF 60-65%, normal wall motion w/o regional wall abnormality. Indeterminant diastolic function due to atrial fibrillation. LA and RA severely dilated.  Discharge Exam: Vitals:   03/29/17 0727 03/29/17 1215  BP: 110/74 120/77  Pulse: 90   Resp: (!) 21   Temp: (!) 97.3 F (36.3 C) 97.7 F (36.5 C)  SpO2: 94%    Patient would like to be discharged so she can start therapy. Denies chest pain, shortness of breath, abdominal pain, nausea, vomiting, headache, dizziness.    General: Well developed, chronically ill appearing, NAD  HEENT: NCAT, mucous membranes moist.  Cardiovascular: S1 S2 auscultated, irregular  Respiratory: Clear to auscultation bilaterally  Abdomen: Soft, nontender, nondistended, + bowel sounds  Extremities: +Edema in UE/LE B/L. Right foot with dry gangrenous toes (4th&5th)  Neuro: AAOx3, nonfocal  Psych: Mildly anxious  Discharge Instructions  Current Discharge Medication List    START taking these medications   Details  acetaminophen (TYLENOL) 325 MG tablet Take 2 tablets (650 mg total) by mouth every 6 (six) hours as needed for mild pain or fever.    amiodarone (PACERONE) 200 MG tablet Take 1 tablet (200 mg total) by mouth 2 (two) times daily.    furosemide 250 mg in dextrose 5 % 225 mL Inject 8 mg/hr into the vein continuous.    heparin 100-0.45 UNIT/ML-% infusion Inject 1,750 Units/hr into the vein continuous. Qty: 250 mL    Hydrocortisone (GERHARDT'S BUTT CREAM) CREA Apply 1 application topically daily.      hydrocortisone sodium succinate (SOLU-CORTEF) 100 MG SOLR injection Inject 1 mL (50 mg total) into the vein every 6 (six) hours. Qty: 1 each    LORazepam (ATIVAN) 2 MG/ML injection Inject 0.25 mLs (0.5 mg total) into the vein every 6 (six) hours as  needed for anxiety. Qty: 1 mL, Refills: 0    midodrine (PROAMATINE) 5 MG tablet Take 1 tablet (5 mg total) by mouth 3 (three) times daily with meals.    morphine 4 MG/ML injection Inject 0.5-1 mLs (2-4 mg total) into the vein every 4 (four) hours as needed for moderate pain. Refills: 0    mouth rinse LIQD solution 15 mLs by Mouth Rinse route 2 (two) times daily. Refills: 0    ondansetron (ZOFRAN) 4 MG tablet Take 1 tablet (4 mg total) by mouth every 6 (six) hours as needed for nausea. Qty: 20 tablet, Refills: 0    pantoprazole (PROTONIX) 20 MG tablet Take 1 tablet (20 mg total) by mouth daily at 12 noon.    piperacillin-tazobactam (ZOSYN) 3.375 GM/50ML IVPB Inject 50 mLs (3.375 g total) into the vein every 8 (eight) hours. Qty: 50 mL    potassium chloride (KLOR-CON M15) 15 MEQ tablet Take 2 tablets (30 mEq total) by mouth 2 (two) times daily.    potassium chloride 20 MEQ/15ML (10%) SOLN Take 30 mLs (40 mEq total) by mouth daily. Qty: 450 mL, Refills: 0      CONTINUE these medications which have NOT CHANGED   Details  aspirin EC 81 MG tablet Take 81 mg by mouth daily.    gabapentin (NEURONTIN) 100 MG capsule Take 100 mg by mouth 3 (three) times daily.      hydroxyurea (HYDREA) 500 MG capsule Take 2 capsules (1,000 mg total) by mouth daily. May take with food to minimize GI side effects. Qty: 60 capsule, Refills: 2   Associated Diagnoses: Polycythemia vera ([place])    levothyroxine (SYNTHROID, LEVOTHROID) 75 MCG tablet Take 75 mcg by mouth daily before breakfast.    Associated Diagnoses: Polycythemia vera ([facility])      STOP taking these medications     CARTIA XT 240 MG 24 hr capsule      guaiFENesin (MUCINEX) 600 MG 12 hr tablet       metolazone (ZAROXOLYN) 2.5 MG tablet      metoprolol (LOPRESSOR) 100 MG tablet      Potassium Chloride ER 20 MEQ TBCR      pravastatin (PRAVACHOL) 20 MG tablet      torsemide (DEMADEX) 20 MG tablet      warfarin (COUMADIN) 5 MG tablet      benzonatate (TESSALON) 100 MG capsule        No Known Allergies Follow-up Information    Waynetta Sandy, MD Follow up in 2 week(s).   Specialties:  Vascular Surgery, Cardiology Why:  office will call Contact information: [place] [address] [phone]            The results of significant diagnostics from this hospitalization (including imaging, microbiology, ancillary and laboratory) are listed below for reference.    Significant Diagnostic Studies: Ct Abdomen Pelvis Wo Contrast  Result Date: 03/17/2017 CLINICAL DATA:  Nausea.  Bilious vomiting. EXAM: CT ABDOMEN AND PELVIS WITHOUT CONTRAST TECHNIQUE: Multidetector CT imaging of the abdomen and pelvis was performed following the standard protocol without IV contrast. COMPARISON:  Right upper quadrant ultrasound yesterday. Remote abdominal CT 07/18/2009 FINDINGS: Lower chest: Bilateral pleural effusions, moderate to large in degree, partially included. There is adjacent compressive atelectasis in the lower lobes. Cardiomegaly with right greater than left heart dilatation. Hepatobiliary: No focal hepatic lesion allowing for lack contrast and arms down positioning. Layering high-density material gallbladder consistent with sludge and stones is seen on recent ultrasound. No biliary dilatation.  Pancreas: No ductal dilatation. No gross peripancreatic inflammation. Spleen: Subcapsular splenic calcifications likely sequela of infarcts seen on prior CT. Spleen is upper normal in size spanning 12.7 cm cranial caudal. Adrenals/Urinary Tract: No adrenal nodules, adrenal glands are partially obscured. There is increased density of the bilateral renal cortices on this  noncontrast exam suggesting cortical nephrocalcinosis. No hydronephrosis. No definite urinary tract calculi. Urinary bladder is decompressed by Foley catheter. Stomach/Bowel: Enteric tube in place, tip in the stomach. Stomach is physiologically distended. No evidence of bowel or duodenal obstruction. Enteric contrast throughout nondilated small bowel. Appendix tentatively identified and normal. Majority of the colon is nondistended, scattered air-fluid levels. No definite colonic wall thickening. A rectal tube is in place. Vascular/Lymphatic: Advanced aortic and branch atherosclerosis. Limited assessment for adenopathy given lack of contrast. Reproductive: Uterus is not seen, atrophic versus surgically absent. No gross adnexal mass. Other: Small volume abdominopelvic ascites. Diffuse edema of the intra-abdominal fat. Marked subcutaneous edema, confluent in the flanks. No free intra-abdominal air. No evidence of intra-abdominal abscess. Musculoskeletal: Right hip pinning. Bones are under mineralized. There are no acute or suspicious osseous abnormalities. IMPRESSION: 1. No explanation for bilious vomiting.  No bowel obstruction. 2. Findings consistent with fluid overload with body wall edema, third-spacing, small amount of abdominopelvic ascites and moderate to large bilateral pleural effusions. 3. Cortical nephrocalcinosis. 4. Gallstones/sludge. 5. Advanced aortic atherosclerosis. Electronically Signed   By: Jeb Levering M.D.   On: 03/17/2017 22:35   Dg Abd 1 View  Result Date: 03/15/2017 CLINICAL DATA:  Adjustment of abdominal device EXAM: ABDOMEN - 1 VIEW COMPARISON:  03/15/2017 FINDINGS: NG tube advanced with the tip in the body the stomach. Normal bowel gas pattern. IMPRESSION: NG tube in the body the stomach. Electronically Signed   By: Franchot Gallo M.D.   On: 03/15/2017 19:37   Dg Abd 1 View  Result Date: 03/11/2017 CLINICAL DATA:  Encounter for nasogastric tube placement. EXAM: ABDOMEN - 1 VIEW  COMPARISON:  None. FINDINGS: Nasal/orogastric tube passes below the diaphragm into the stomach. IMPRESSION: Well-positioned nasal/ orogastric tube. Electronically Signed   By: Lajean Manes M.D.   On: 03/11/2017 21:45   US Renal  Result Date: 03/09/2017 CLINICAL DATA:  Acute onset of renal failure.  Initial encounter. EXAM: RENAL / URINARY TRACT ULTRASOUND COMPLETE COMPARISON:  CT of the abdomen and pelvis performed 07/18/2009 FINDINGS: Right Kidney: Length: 9.6 cm. Mildly increased renal parenchymal echogenicity is suggested. No mass or hydronephrosis visualized. Left Kidney: Length: 10.4 cm. Mildly increased renal parenchymal echogenicity is suggested. Left-sided pelvicaliectasis remains within normal limits. No mass or hydronephrosis visualized. Bladder: Decompressed and not well assessed. IMPRESSION: 1. No evidence of hydronephrosis. 2. Mildly increased renal parenchymal echogenicity may reflect medical renal disease. Electronically Signed   By: Garald Balding M.D.   On: 03/09/2017 21:21   Nm Myocar Multi W/spect W/wall Motion / Ef  Result Date: 03/07/2017 CLINICAL DATA:  Atrial fibrillation. Hypertension and hyperlipidemia. EXAM: MYOCARDIAL IMAGING WITH SPECT (REST AND PHARMACOLOGIC-STRESS) GATED LEFT VENTRICULAR WALL MOTION STUDY LEFT VENTRICULAR EJECTION FRACTION TECHNIQUE: Standard myocardial SPECT imaging was performed after resting intravenous injection of 10 mCi Tc-38m tetrofosmin. Subsequently, intravenous infusion of Lexiscan was performed under the supervision of the Cardiology staff. At peak effect of the drug, 30 mCi Tc-75m tetrofosmin was injected intravenously and standard myocardial SPECT imaging was performed. Quantitative gated imaging was also performed to evaluate left ventricular wall motion, and estimate left ventricular ejection fraction. COMPARISON:  None. FINDINGS: Perfusion: No decreased activity in  the left ventricle on stress imaging to suggest reversible ischemia or infarction.  Wall Motion: Normal left ventricular wall motion. No left ventricular dilation. Left Ventricular Ejection Fraction: 69 % End diastolic volume 88 ml End systolic volume 27 ml IMPRESSION: 1. No reversible ischemia or infarction. 2. Normal left ventricular wall motion. 3. Left ventricular ejection fraction 69% 4. Non invasive risk stratification*: Low *2012 Appropriate Use Criteria for Coronary Revascularization Focused Update: J Am Coll Cardiol. 4098;11(9):147-829. http://content.airportbarriers.com.aspx?articleid=[email or web address] Electronically Signed   By: Earle Gell M.D.   On: 03/07/2017 14:34   Dg Chest Port 1 View  Result Date: 03/28/2017 CLINICAL DATA:  Respiratory difficulty EXAM: PORTABLE CHEST 1 VIEW COMPARISON:  March 25, 2017 FINDINGS: Moderate to large bilateral pleural effusions and underlying opacities are similar in the interval. A left central line terminates in the central SVC, unchanged. No change in the cardiomediastinal silhouette. Pulmonary edema is stable. IMPRESSION: Pulmonary edema and moderate to large bilateral pleural effusions with underlying opacities. The findings are similar in the interval. Electronically Signed   By: Dorise Bullion III M.D   On: 03/28/2017 11:49   Dg Chest Port 1 View  Result Date: 03/25/2017 CLINICAL DATA:  Hypoxia and weakness EXAM: PORTABLE CHEST 1 VIEW COMPARISON:  03/22/2017 FINDINGS: Cardiac shadow is mildly enlarged but stable. Left subclavian central line is again seen and stable. Bilateral pleural effusions are noted, left greater than right. Persistent changes of CHF with pulmonary edema are seen and slightly worsened from the prior exam. No bony abnormality is seen. IMPRESSION: Increasing changes of CHF with enlarging pleural effusions left greater than right. Electronically Signed   By: Inez Catalina M.D.   On: 03/25/2017 07:20   Dg Chest Port 1 View  Result Date: 03/22/2017 CLINICAL DATA:  Hx of resp failure, Afib, CHF, DVT, EXAM: PORTABLE CHEST 1  VIEW COMPARISON:  03/21/2017 FINDINGS: Bilateral perihilar hazy airspace opacities associated with interstitial thickening and more confluent lung base opacities, in part due to pleural effusions, is without significant change from prior study, allowing for differences in patient positioning and technique. No new lung abnormalities. No pneumothorax. Cardiac silhouette is mildly enlarged. Left subclavian central venous line is stable. IMPRESSION: 1. No significant change in lung aeration. Findings remain consistent congestive heart failure with pulmonary edema and bilateral effusions. Electronically Signed   By: Lajean Manes M.D.   On: 03/22/2017 07:22   Dg Chest Port 1 View  Result Date: 03/21/2017 CLINICAL DATA:  Followup for acute respiratory failure and hypoxia. EXAM: PORTABLE CHEST 1 VIEW COMPARISON:  03/17/2017 FINDINGS: Since prior exam, the endotracheal tube and nasogastric tube have been removed. A left subclavian central venous line is stable and well positioned. There is bilateral perihilar and lower lung zone opacity obscuring hemidiaphragms consistent with a combination of pleural fluid with either edema or atelectasis or a combination. Opacity in the perihilar/mid lungs has increased from the prior exam. No pneumothorax. Cardiac silhouette is mildly enlarged. No convincing mediastinal or hilar masses. IMPRESSION: 1. Lung opacities consistent with a combination of bilateral pleural effusions with pulmonary edema, atelectasis or a combination. Perihilar lung opacity appears mildly increased from the most recent prior study suggesting worsened pulmonary edema. 2. Mild persistent cardiomegaly.  No new lung abnormalities. Electronically Signed   By: Lajean Manes M.D.   On: 03/21/2017 08:05   Dg Chest Port 1 View  Result Date: 03/17/2017 CLINICAL DATA:  Respiratory failure.  Hypoxia. EXAM: PORTABLE CHEST 1 VIEW COMPARISON:  03/15/2017. FINDINGS: Endotracheal tube, NG tube,  left subclavian line in  stable position. Cardiomegaly with bilateral pulmonary infiltrates and bilateral pleural effusions consistent with CHF. Slight interim progression. No pneumothorax. IMPRESSION: 1.  Lines and tubes in stable position. 2. Congestive heart failure with bilateral pulmonary edema and bilateral pleural effusions. Slight interim progression. Electronically Signed   By: Marcello Moores  Register   On: 03/17/2017 06:49   Dg Chest Port 1 View  Result Date: 03/15/2017 CLINICAL DATA:  ETT advanced 2 cm EXAM: PORTABLE CHEST 1 VIEW COMPARISON:  03/15/2017 FINDINGS: Endotracheal tube 2 cm above the carina.  Central line in the SVC Bilateral pleural effusions and bibasilar atelectasis mildly improved. IMPRESSION: Endotracheal tube now 2 cm above the carina Bilateral pleural effusions and bibasilar atelectasis show mild interval improvement. Electronically Signed   By: Franchot Gallo M.D.   On: 03/15/2017 19:35   Dg Chest Port 1 View  Result Date: 03/15/2017 CLINICAL DATA:  Acute respiratory failure. EXAM: PORTABLE CHEST 1 VIEW COMPARISON:  One-view chest x-ray 6/ the one-view chest x-ray 03/14/2017 FINDINGS: Endotracheal tube is stable, 5 cm above the carina. Side port of the NG tube is just above the GE junction and could be advanced for more optimal positioning. A left subclavian line is stable. Cardiac enlargement, mild edema, and bilateral effusions are again noted. Bibasilar airspace disease is present, likely reflecting atelectasis. There is no significant interval change. IMPRESSION: 1. Stable cardiomegaly, mild edema, and pleural effusions compatible with congestive heart failure. 2. Support apparatus is stable. 3. The side port of the NG tube is likely just above the GE junction and could be advanced 6-10 cm for more optimal positioning. Electronically Signed   By: San Morelle M.D.   On: 03/15/2017 07:25   Dg Chest Port 1 View  Result Date: 03/14/2017 CLINICAL DATA:  Endotracheal tube placement. EXAM: PORTABLE  CHEST 1 VIEW COMPARISON:  03/13/2017 FINDINGS: Endotracheal tube tip is 4.8 cm above the carina. Left subclavian central venous catheter with tip over the SVC unchanged. Nasogastric tube courses into the region of the stomach and off the inferior portion of the film unchanged. Lungs are adequately inflated demonstrate persistent hazy opacification over the mid to lower lungs bilaterally suggesting moderate size effusions with associated atelectasis. Cannot exclude a component of interstitial edema or infection. Stable cardiomegaly. Remainder the exam is unchanged. IMPRESSION: Stable hazy airspace density over the mid to lower lung suggesting moderate effusions with atelectasis. Cannot exclude infection or component of mild interstitial edema. Stable cardiomegaly. Tubes and lines as described. Electronically Signed   By: Marin Olp M.D.   On: 03/14/2017 07:28   Dg Chest Port 1 View  Result Date: 03/13/2017 CLINICAL DATA:  Acute respiratory failure with hypoxemia EXAM: PORTABLE CHEST 1 VIEW COMPARISON:  03/12/2017 FINDINGS: Endotracheal tube in good position. Central venous catheter tip SVC. NG in the proximal stomach unchanged. Moderate bilateral effusions unchanged. Bibasilar airspace disease unchanged. Probable heart failure with edema. IMPRESSION: No significant change. Bilateral pleural effusions with bibasilar consolidation. Probable heart failure. Electronically Signed   By: Franchot Gallo M.D.   On: 03/13/2017 09:21   Dg Chest Port 1 View  Result Date: 03/12/2017 CLINICAL DATA:  Central line placement. EXAM: PORTABLE CHEST 1 VIEW COMPARISON:  Chest x-ray from earlier today. FINDINGS: Interval placement of a left subclavian central venous catheter with tip at the cavoatrial junction. Endotracheal and enteric tubes are in unchanged position. Stable cardiomegaly. Persistent bibasilar hazy opacities reflecting moderate effusions and atelectasis. No pneumothorax. IMPRESSION: 1. Interval insertion of a  left  subclavian central venous catheter with tip in good position projecting over the cavoatrial junction. No pneumothorax. 2. Persistent bibasilar hazy opacities, likely combination of effusion and atelectasis. Electronically Signed   By: Titus Dubin M.D.   On: 03/12/2017 09:47   Dg Chest Port 1 View  Result Date: 03/12/2017 CLINICAL DATA:  Endotracheal tube placement, respiratory failure. EXAM: PORTABLE CHEST 1 VIEW COMPARISON:  03/11/2017 FINDINGS: Endotracheal tube with tip 6.1 cm above the carina. Nasogastric tube courses into the region of the stomach and off the inferior portion of the film. Exam demonstrates no change to slight worsening of hazy opacification over the mid to lower lungs likely moderate bilateral pleural effusions with associated bibasilar atelectasis. Right effusion appears slightly improved no left effusion slightly worse. Likely mild associated interstitial edema. Stable cardiomegaly. Remainder of the exam is unchanged. IMPRESSION: Persistent hazy opacification over the mid to lower lungs likely bilateral effusions with bibasilar atelectasis. Right effusion slightly improved on left effusion slightly worse. Mild interstitial edema.  Stable cardiomegaly. Tubes and lines as described. Electronically Signed   By: Marin Olp M.D.   On: 03/12/2017 08:14   Dg Chest Port 1 View  Result Date: 03/11/2017 CLINICAL DATA:  Encounter for endotracheal tube placement. EXAM: PORTABLE CHEST 1 VIEW COMPARISON:  02/12/2017 FINDINGS: Endotracheal tube tip projects 5 cm above the carina. Nasogastric tube passes below the included field of view, not definitely below diaphragm, but likely into the stomach. All There is opacity in the lung bases obscuring hemidiaphragms extending to the perihilar regions. This is likely due to a combination of moderate pleural effusions with either atelectasis, pneumonia or a combination. No convincing pulmonary edema. Lung apices are clear. No pneumothorax. Skeletal  structures are demineralized but grossly intact. IMPRESSION: 1. Endotracheal tube tip projects 5 cm above carina. 2. Nasogastric tube incompletely imaged but likely extends to the stomach. 3. Moderate bilateral pleural effusions with associated atelectasis, pneumonia or a combination. No convincing pulmonary edema. Electronically Signed   By: Lajean Manes M.D.   On: 03/11/2017 20:41   Dg Abd Portable 1v  Result Date: 03/15/2017 CLINICAL DATA:  Increased vomiting. EXAM: PORTABLE ABDOMEN - 1 VIEW COMPARISON:  03/11/2017. FINDINGS: Paucity of intestinal gas with no visible dilated bowel loops. No significant change in bilateral pleural effusions. Enlarged cardiac silhouette. Curvilinear density in the inferior pelvis in the expected position of the urinary bladder. Right hip fixation screws. Atheromatous arterial calcifications. Nasogastric tube tip in the proximal stomach and side hole in the region of the gastroesophageal junction. IMPRESSION: 1. Nasogastric tube tip in the proximal stomach and side hole in the region of the gastroesophageal junction. 2. Curvilinear density in the inferior pelvis in the expected position of the urinary bladder, most likely representing a Foley catheter. A rectal tube is also a possibility. 3. Cardiomegaly and bilateral pleural effusions. Electronically Signed   By: Claudie Revering M.D.   On: 03/15/2017 11:13   Dg Swallowing Func-speech Pathology  Result Date: 03/27/2017 Objective Swallowing Evaluation: Type of Study: MBS-Modified Barium Swallow Study Patient Details Name: Charlotte [name] MRN: [id number] Date of Birth: 11/06/1940 Today's Date: 03/27/2017 Time: SLP Start Time (ACUTE ONLY): 0850-SLP Stop Time (ACUTE ONLY): 0910 SLP Time Calculation (min) (ACUTE ONLY): 20 min Past Medical History: Past Medical History: Diagnosis Date . Arthritis  . Atrial fibrillation, chronic (HCC)   a. on Coumadin for anticoagulation.  . CHF (congestive heart failure) ([place])  . Deaf  . Diastolic  heart failure ([place])  . DVT (deep venous  thrombosis) ([place])  . Essential hypertension  . Hyperlipidemia  . Hypothyroidism  . On home O2  . Peripheral neuropathy  . Peripheral vascular disease ([place])  . Polycythemia vera(238.4) 10/01/2011 . Ulcer of ankle ([place])   Bilateral 2015 . Varicose veins  Past Surgical History: Past Surgical History: Procedure Laterality Date . ABDOMINAL AORTAGRAM N/A 09/14/2012  Procedure: ABDOMINAL [name];  Surgeon: Serafina Mitchell, MD;  [facility] C[facility] LAB;  Service: Cardiovascular;  Laterality: N/A; . ABDOMINAL AORTOGRAM N/A 03/06/2017  Procedure: ABDOMINAL AORTOGRAM;  Surgeon: Elam Dutch, MD;  Location: [place] CV LAB;  Service: Cardiovascular;  Laterality: N/A; . ABDOMINAL HYSTERECTOMY   . CORONARY ANGIOPLASTY   . DRESSING CHANGE UNDER ANESTHESIA  12/02/2011  Procedure: DRESSING CHANGE UNDER ANESTHESIA;  Surgeon: Carole Civil, MD;  Location: [facility];  Service: Orthopedics;  Laterality: Right; . ENDARTERECTOMY FEMORAL Right 09/15/2012  Procedure: ENDARTERECTOMY FEMORAL;  Surgeon: Mal Misty, MD;  Location: [place];  Service: Vascular;  Laterality: Right; . FASCIOTOMY  11/29/2011  Procedure: FASCIOTOMY;  Surgeon: Carole Civil, MD;  Location: [facility];  Service: Orthopedics;  Laterality: Right;  right thigh  . FEMORAL-POPLITEAL BYPASS GRAFT Right 09/15/2012  Procedure: BYPASS GRAFT FEMORAL-POPLITEAL ARTERY;  Surgeon: Mal Misty, MD;  Location: [place];  Service: Vascular;  Laterality: Right; . FEMORAL-TIBIAL BYPASS GRAFT Right 03/11/2017  Procedure: RIGHT FEMORAL-TIBIAL BYPASS IN-SITU GREATER SAPHENOUS VEIN;  Surgeon: Waynetta Sandy, MD;  Location: [place];  Service: Vascular;  Laterality: Right; . HIP PINNING,CANNULATED  11/19/2011  Procedure: CANNULATED HIP PINNING;  Surgeon: Sanjuana Kava, MD;  Location: [facility];  Service: Orthopedics;  Laterality: Right; . LOWER EXTREMITY ANGIOGRAPHY Bilateral 03/06/2017  Procedure: Lower Extremity Angiography;  Surgeon: Elam Dutch, MD;  Location: [place] CV LAB;  Service: Cardiovascular;  Laterality: Bilateral; . PATCH ANGIOPLASTY Right 09/15/2012  Procedure: PATCH ANGIOPLASTY;  Surgeon: Mal Misty, MD;  Location: [place];  Service: Vascular;  Laterality: Right; . TUBAL LIGATION   HPI: 76 year old female admitted 03/04/17 with right leg pain and discoloration, found to have pneumonia, septic shock. PMh significant for AFib, diastolic heart failure, HTN, HOH, PVD, polycysthemia. Pt underwent R fem to tp trunk bypass 03/11/17. She was intubated from 8/8-8/17. Chest x-ray 8/18 shows Lung opacities consistent with a combination of bilateral pleural effusions with pulmonary edema, atelectasis or a combination. Perihilar lung opacity appears mildly increased from the most recent prior study suggesting worsened pulmonary edema. Subjective: pt alert, upright in chair Assessment / Plan / Recommendation CHL IP CLINICAL IMPRESSIONS 03/27/2017 Clinical Impression Pt's swallow function has significantly improved. Pharyngeal constriction, laryngeal elevation and epiglottic inversion were within functional limits with minimal-mild residue with thicker consistencies not increasing pt's risk. No laryngeal penetration or aspiration observed. Barium pill hesitated in the vallecula with bite applesauce assisting transit to stomach. Recommend regular diet texture, thin liquids, straws allowed, pills with liquids. ST to follow briefly to ensure safey.    SLP Visit Diagnosis Dysphagia, pharyngeal phase (R13.13) Attention and concentration deficit following -- Frontal lobe and executive function deficit following -- Impact on safety and function Mild aspiration risk   CHL IP TREATMENT RECOMMENDATION 03/27/2017 Treatment Recommendations Therapy as outlined in treatment plan below   Prognosis 03/27/2017 Prognosis for Safe Diet Advancement Good Barriers to Reach Goals -- Barriers/Prognosis Comment -- CHL IP DIET RECOMMENDATION 03/27/2017 SLP Diet Recommendations  Regular solids;Thin liquid Liquid Administration via Straw;Cup Medication Administration Whole meds with liquid Compensations Slow rate;Small sips/bites;Minimize environmental distractions Postural  Changes Seated upright at 90 degrees   CHL IP OTHER RECOMMENDATIONS 03/27/2017 Recommended Consults -- Oral Care Recommendations Oral care BID Other Recommendations --   CHL IP FOLLOW UP RECOMMENDATIONS 03/27/2017 Follow up Recommendations None   CHL IP FREQUENCY AND DURATION 03/27/2017 Speech Therapy Frequency (ACUTE ONLY) min 2x/week Treatment Duration 2 weeks      CHL IP ORAL PHASE 03/27/2017 Oral Phase WFL Oral - Pudding Teaspoon -- Oral - Pudding Cup -- Oral - Honey Teaspoon -- Oral - Honey Cup -- Oral - Nectar Teaspoon -- Oral - Nectar Cup -- Oral - Nectar Straw -- Oral - Thin Teaspoon -- Oral - Thin Cup -- Oral - Thin Straw -- Oral - Puree -- Oral - Mech Soft -- Oral - Regular -- Oral - Multi-Consistency -- Oral - Pill -- Oral Phase - Comment --  CHL IP PHARYNGEAL PHASE 03/27/2017 Pharyngeal Phase Impaired Pharyngeal- Pudding Teaspoon -- Pharyngeal -- Pharyngeal- Pudding Cup -- Pharyngeal -- Pharyngeal- Honey Teaspoon -- Pharyngeal -- Pharyngeal- Honey Cup Pharyngeal residue - valleculae;Pharyngeal residue - pyriform Pharyngeal -- Pharyngeal- Nectar Teaspoon -- Pharyngeal -- Pharyngeal- Nectar Cup Pharyngeal residue - valleculae;Pharyngeal residue - pyriform Pharyngeal Material does not enter airway Pharyngeal- Nectar Straw NT Pharyngeal -- Pharyngeal- Thin Teaspoon NT Pharyngeal -- Pharyngeal- Thin Cup [facility] Pharyngeal Material does not enter airway Pharyngeal- Thin Straw WFL Pharyngeal -- Pharyngeal- Puree NT Pharyngeal -- Pharyngeal- Mechanical Soft -- Pharyngeal -- Pharyngeal- Regular WFL Pharyngeal -- Pharyngeal- Multi-consistency -- Pharyngeal -- Pharyngeal- Pill Pharyngeal residue - valleculae;Reduced tongue base retraction Pharyngeal -- Pharyngeal Comment --  CHL IP CERVICAL ESOPHAGEAL PHASE 03/27/2017 Cervical  Esophageal Phase WFL Pudding Teaspoon -- Pudding Cup -- Honey Teaspoon -- Honey Cup -- Nectar Teaspoon -- Nectar Cup -- Nectar Straw -- Thin Teaspoon -- Thin Cup -- Thin Straw -- Puree -- Mechanical Soft -- Regular -- Multi-consistency -- Pill -- Cervical Esophageal Comment -- CHL IP GO 02/13/2017 Functional Assessment Tool Used clinical judgement Functional Limitations Swallowing Swallow Current Status (T6546) CI Swallow Goal Status (T0354) CI Swallow Discharge Status (S5681) CI Motor Speech Current Status (E7517) (None) Motor Speech Goal Status (G0174) (None) Motor Speech Goal Status (B4496) (None) Spoken Language Comprehension Current Status (P5916) (None) Spoken Language Comprehension Goal Status (B8466) (None) Spoken Language Comprehension Discharge Status (Z9935) (None) Spoken Language Expression Current Status (T0177) (None) Spoken Language Expression Goal Status (L3903) (None) Spoken Language Expression Discharge Status (E0923) (None) Attention Current Status (R0076) (None) Attention Goal Status (A2633) (None) Attention Discharge Status (H5456) (None) Memory Current Status (Y5638) (None) Memory Goal Status (L3734) (None) Memory Discharge Status (K8768) (None) Voice Current Status (T1572) (None) Voice Goal Status (I2035) (None) Voice Discharge Status (D9741) (None) Other Speech-Language Pathology Functional Limitation Current Status (U3845) (None) Other Speech-Language Pathology Functional Limitation Goal Status (X6468) (None) Other Speech-Language Pathology Functional Limitation Discharge Status [phone]) (None) [place] Siren 03/27/2017, 9:47 AM Orbie Pyo Colvin Caroli.Ed CCC-SLP Pager [phone]              Dg Swallowing Func-speech Pathology  Result Date: 03/21/2017 Objective Swallowing Evaluation: Type of Study: MBS-Modified Barium Swallow Study Patient Details Name: Charlotte [name] MRN: [id number] Date of Birth: 08-01-1941 Today's Date: 03/21/2017 Time: SLP Start Time (ACUTE ONLY): 1125-SLP Stop Time  (ACUTE ONLY): 1145 SLP Time Calculation (min) (ACUTE ONLY): 20 min Past Medical History: Past Medical History: Diagnosis Date . Arthritis  . Atrial fibrillation, chronic (HCC)   a. on Coumadin for anticoagulation.  . CHF (congestive heart failure) ([place])  . Deaf  .  Diastolic heart failure ([place])  . DVT (deep venous thrombosis) ([place])  . Essential hypertension  . Hyperlipidemia  . Hypothyroidism  . On home O2  . Peripheral neuropathy  . Peripheral vascular disease ([place])  . Polycythemia vera(238.4) 10/01/2011 . Ulcer of ankle ([place])   Bilateral 2015 . Varicose veins  Past Surgical History: Past Surgical History: Procedure Laterality Date . ABDOMINAL AORTAGRAM N/A 09/14/2012  Procedure: ABDOMINAL [name];  Surgeon: Serafina Mitchell, MD;  Location[facility] CATH LAB;  Service: Cardiovascular;  Laterality: N/A; . ABDOMINAL AORTOGRAM N/A 03/06/2017  Procedure: ABDOMINAL AORTOGRAM;  Surgeon: Elam Dutch, MD;  Location: [place] CV LAB;  Service: Cardiovascular;  Laterality: N/A; . ABDOMINAL HYSTERECTOMY   . CORONARY ANGIOPLASTY   . DRESSING CHANGE UNDER ANESTHESIA  12/02/2011  Procedure: DRESSING CHANGE UNDER ANESTHESIA;  Surgeon: Carole Civil, MD;  Location: [facility];  Service: Orthopedics;  Laterality: Right; . ENDARTERECTOMY FEMORAL Right 09/15/2012  Procedure: ENDARTERECTOMY FEMORAL;  Surgeon: Mal Misty, MD;  Location: [place];  Service: Vascular;  Laterality: Right; . FASCIOTOMY  11/29/2011  Procedure: FASCIOTOMY;  Surgeon: Carole Civil, MD;  Location: [facility];  Service: Orthopedics;  Laterality: Right;  right thigh  . FEMORAL-POPLITEAL BYPASS GRAFT Right 09/15/2012  Procedure: BYPASS GRAFT FEMORAL-POPLITEAL ARTERY;  Surgeon: Mal Misty, MD;  Location: [place];  Service: Vascular;  Laterality: Right; . FEMORAL-TIBIAL BYPASS GRAFT Right 03/11/2017  Procedure: RIGHT FEMORAL-TIBIAL BYPASS IN-SITU GREATER SAPHENOUS VEIN;  Surgeon: Waynetta Sandy, MD;  Location: [place];  Service: Vascular;  Laterality: Right;  . HIP PINNING,CANNULATED  11/19/2011  Procedure: CANNULATED HIP PINNING;  Surgeon: Sanjuana Kava, MD;  Location: [facility];  Service: Orthopedics;  Laterality: Right; . LOWER EXTREMITY ANGIOGRAPHY Bilateral 03/06/2017  Procedure: Lower Extremity Angiography;  Surgeon: Elam Dutch, MD;  Location: [place] CV LAB;  Service: Cardiovascular;  Laterality: Bilateral; . PATCH ANGIOPLASTY Right 09/15/2012  Procedure: PATCH ANGIOPLASTY;  Surgeon: Mal Misty, MD;  Location: [place];  Service: Vascular;  Laterality: Right; . TUBAL LIGATION   HPI: 76 year old female admitted 03/04/17 with right leg pain and discoloration, found to have pneumonia, septic shock. PMh significant for AFib, diastolic heart failure, HTN, HOH, PVD, polycysthemia. Pt underwent R fem to tp trunk bypass 03/11/17. She was intubated from 8/8-8/17. Chest x-ray 8/18 shows Lung opacities consistent with a combination of bilateral pleural effusions with pulmonary edema, atelectasis or a combination. Perihilar lung opacity appears mildly increased from the most recent prior study suggesting worsened pulmonary edema. Subjective: pt alert, upright in chair Assessment / Plan / Recommendation CHL IP CLINICAL IMPRESSIONS 03/21/2017 Clinical Impression Patient presents with acute, reversible mild-moderate pharyngeal phase dysphagia s/p 9 day intubation. Oral stage of swallowing WFL. Pharyngeal stage characterized by mildly reduced tongue base retraction, adequate hyolaryngeal excursion and pharyngeal constriction. Pt with impaired pharyngeal sensation leading to delayed swallow initiation to pyriform sinuses with thin, nectar thick liquids. This resulted in intermittent penetration, trace silent aspiration before the swallow with multiple and single sips. No penetration/aspiration with honey-thick liquids or solids. Pt is hard of hearing and had difficulty following commands for cough/reswallow, chin tuck. Mild coating of contrast remains (valleculae, pyriform and  posterior pharynx), pattern suggestive of secretions in pharynx. With regular solid and barium pill (pt masticated despite cue to swallow whole), pt had moderate residue contained in the valleculae, which reduced/cleared with liquid wash + chin tuck. Recommend initiating dys 3 diet with honey-thick liquids, medications crushed in puree, alternate solids and liquids.  SLP will f/u for tolerance, repeat instrumental assessment and advancement as appropriate. SLP Visit Diagnosis Dysphagia, pharyngeal phase (R13.13) Attention and concentration deficit following -- Frontal lobe and executive function deficit following -- Impact on safety and function Moderate aspiration risk   CHL IP TREATMENT RECOMMENDATION 03/21/2017 Treatment Recommendations Therapy as outlined in treatment plan below;F/U MBS in --- days (Comment);F/U FEES in --- days (Comment)   Prognosis 03/21/2017 Prognosis for Safe Diet Advancement Good Barriers to Reach Goals -- Barriers/Prognosis Comment -- CHL IP DIET RECOMMENDATION 03/21/2017 SLP Diet Recommendations Dysphagia 3 (Mech soft) solids;Honey thick liquids Liquid Administration via Cup Medication Administration Crushed with puree Compensations Slow rate;Small sips/bites;Follow solids with liquid Postural Changes Seated upright at 90 degrees   CHL IP OTHER RECOMMENDATIONS 03/21/2017 Recommended Consults -- Oral Care Recommendations Oral care BID Other Recommendations Order thickener from pharmacy;Prohibited food (jello, ice cream, thin soups);Remove water pitcher;Have oral suction available   CHL IP FOLLOW UP RECOMMENDATIONS 03/21/2017 Follow up Recommendations Other (comment)   CHL IP FREQUENCY AND DURATION 03/21/2017 Speech Therapy Frequency (ACUTE ONLY) min 2x/week Treatment Duration 2 weeks      CHL IP ORAL PHASE 03/21/2017 Oral Phase WFL Oral - Pudding Teaspoon -- Oral - Pudding Cup -- Oral - Honey Teaspoon -- Oral - Honey Cup -- Oral - Nectar Teaspoon -- Oral - Nectar Cup -- Oral - Nectar Straw -- Oral  - Thin Teaspoon -- Oral - Thin Cup -- Oral - Thin Straw -- Oral - Puree -- Oral - Mech Soft -- Oral - Regular -- Oral - Multi-Consistency -- Oral - Pill -- Oral Phase - Comment --  CHL IP PHARYNGEAL PHASE 03/21/2017 Pharyngeal Phase Impaired Pharyngeal- Pudding Teaspoon -- Pharyngeal -- Pharyngeal- Pudding Cup -- Pharyngeal -- Pharyngeal- Honey Teaspoon -- Pharyngeal -- Pharyngeal- Honey Cup Delayed swallow initiation-vallecula;Pharyngeal residue - valleculae;Pharyngeal residue - pyriform;Pharyngeal residue - posterior pharnyx Pharyngeal -- Pharyngeal- Nectar Teaspoon -- Pharyngeal -- Pharyngeal- Nectar Cup Delayed swallow initiation-pyriform sinuses;Reduced airway/laryngeal closure;Penetration/Aspiration before swallow;Trace aspiration;Pharyngeal residue - valleculae;Pharyngeal residue - posterior pharnyx;Pharyngeal residue - pyriform Pharyngeal Material enters airway, passes BELOW cords without attempt by patient to eject out (silent aspiration) Pharyngeal- Nectar Straw Delayed swallow initiation-pyriform sinuses;Reduced airway/laryngeal closure;Penetration/Aspiration before swallow;Trace aspiration;Pharyngeal residue - valleculae;Pharyngeal residue - pyriform;Pharyngeal residue - posterior pharnyx Pharyngeal -- Pharyngeal- Thin Teaspoon Delayed swallow initiation-pyriform sinuses;Pharyngeal residue - valleculae;Pharyngeal residue - pyriform;Pharyngeal residue - posterior pharnyx Pharyngeal -- Pharyngeal- Thin Cup Delayed swallow initiation-pyriform sinuses;Reduced airway/laryngeal closure;Penetration/Aspiration before swallow;Trace aspiration Pharyngeal -- Pharyngeal- Thin Straw -- Pharyngeal -- Pharyngeal- Puree Delayed swallow initiation-vallecula;Pharyngeal residue - valleculae;Pharyngeal residue - pyriform;Pharyngeal residue - posterior pharnyx Pharyngeal -- Pharyngeal- Mechanical Soft -- Pharyngeal -- Pharyngeal- Regular Delayed swallow initiation-vallecula;Pharyngeal residue - valleculae Pharyngeal --  Pharyngeal- Multi-consistency -- Pharyngeal -- Pharyngeal- Pill Delayed swallow initiation-vallecula;Pharyngeal residue - valleculae Pharyngeal -- Pharyngeal Comment --  CHL IP CERVICAL ESOPHAGEAL PHASE 03/21/2017 Cervical Esophageal Phase WFL Pudding Teaspoon -- Pudding Cup -- Honey Teaspoon -- Honey Cup -- Nectar Teaspoon -- Nectar Cup -- Nectar Straw -- Thin Teaspoon -- Thin Cup -- Thin Straw -- Puree -- Mechanical Soft -- Regular -- Multi-consistency -- Pill -- Cervical Esophageal Comment -- CHL IP GO 02/13/2017 Functional Assessment Tool Used clinical judgement Functional Limitations Swallowing Swallow Current Status (S9233) CI Swallow Goal Status (A0762) CI Swallow Discharge Status (U6333) CI Motor Speech Current Status (L4562) (None) Motor Speech Goal Status (B6389) (None) Motor Speech Goal Status (H7342) (None) Spoken Language Comprehension Current Status (A7681) (None) Spoken Language Comprehension Goal Status (L5726) (None) Spoken Language  Comprehension Discharge Status [phone]) (None) Spoken Language Expression Current Status [phone]) (None) Spoken Language Expression Goal Status [phone]) (None) Spoken Language Expression Discharge Status [phone]) (None) Attention Current Status [phone]) (None) Attention Goal Status (D1761) (None) Attention Discharge Status [phone]) (None) Memory Current Status (T0626) (None) Memory Goal Status (R4854) (None) Memory Discharge Status (O2703) (None) Voice Current Status (J0093) (None) Voice Goal Status (G1829) (None) Voice Discharge Status (H3716) (None) Other Speech-Language Pathology Functional Limitation Current Status (R6789) (None) Other Speech-Language Pathology Functional Limitation Goal Status (F8101) (None) Other Speech-Language Pathology Functional Limitation Discharge Status [phone]) (None) Deneise Lever, MS, CCC-SLP Speech-Language Pathologist [phone] Aliene Altes 03/21/2017, 2:22 PM              US Abdomen Limited Ruq  Result Date: 03/16/2017 CLINICAL DATA:   Transaminitis. EXAM: ULTRASOUND ABDOMEN LIMITED RIGHT UPPER QUADRANT COMPARISON:  None. FINDINGS: Gallbladder: The gallbladder contains some dependent sludge is well as some small shadowing calculi. No overt gallbladder wall thickening. Common bile duct: Diameter: 5 mm. Liver: Liver parenchyma is mildly heterogeneous without overt cirrhosis. No focal masses or evidence of intrahepatic biliary ductal dilatation. The portal vein is patent. Visible right pleural effusion. Small volume of ascites is present around the liver. IMPRESSION: 1. Biliary sludge and small gallstones within the gallbladder lumen. 2. No evidence of biliary obstruction. 3. Liver parenchyma mildly heterogeneous without evidence of significant steatosis or cirrhosis. A small amount of ascites is present as well as a right pleural effusion. Electronically Signed   By: Aletta Edouard M.D.   On: 03/16/2017 16:33    Microbiology: Recent Results (from the past 240 hour(s))  Culture, blood (Routine X 2) w Reflex to ID Panel     Status: None (Preliminary result)   Collection Time: 03/27/17  7:05 AM  Result Value Ref Range Status   Specimen Description BLOOD LEFT HAND  Final   Special Requests   Final    BOTTLES DRAWN AEROBIC AND ANAEROBIC Blood Culture results may not be optimal due to an inadequate volume of blood received in culture bottles   Culture NO GROWTH 2 DAYS  Final   Report Status PENDING  Incomplete  Culture, blood (Routine X 2) w Reflex to ID Panel     Status: None (Preliminary result)   Collection Time: 03/27/17  7:12 AM  Result Value Ref Range Status   Specimen Description BLOOD RIGHT HAND  Final   Special Requests IN PEDIATRIC BOTTLE Blood Culture adequate volume  Final   Culture NO GROWTH 2 DAYS  Final   Report Status PENDING  Incomplete     Labs: Basic Metabolic Panel:  Recent Labs Lab 03/23/17 0410  03/24/17 0951 03/25/17 0507  03/26/17 0219 03/26/17 1143 03/26/17 2330 03/27/17 1104 03/27/17 2313  03/29/17 0500  NA  --   < > 135 136  < > 134* 135 132* 136 135 137  K  --   < > 3.7 3.8  < > 3.6 4.0 3.8 3.4* 3.3* 2.4*  CL  --   < > 105 106  < > 105 103 101 102 102 102  CO2  --   < > 24 24  < > 24 24 22 23 22 23   GLUCOSE  --   < > 98 69  < > 75 84 113* 112* 99 107*  BUN  --   < > 30* 27*  < > 27* 26* 28* 28* 30* 30*  CREATININE  --   < > 1.16* 1.05*  < >  1.15* 1.18* 1.24* 1.24* 1.32* 1.41*  CALCIUM  --   < > 8.1* 7.8*  < > 7.8* 8.2* 7.9* 8.4* 8.2* 8.0*  MG 1.8  --  1.7 1.7  < >  --  1.8 2.0 1.9 1.8 1.6*  PHOS 3.2  --  3.3 3.3  --  3.6  --   --   --   --   --   < > = values in this interval not displayed. Liver Function Tests:  Recent Labs Lab 03/23/17 1052 03/24/17 0951 03/25/17 0507 03/26/17 0219  AST 24  --   --   --   ALT 38  --   --   --   ALKPHOS 64  --   --   --   BILITOT 1.5*  --   --   --   PROT 4.7*  --   --   --   ALBUMIN 2.1* 2.1* 1.9* 2.0*   No results for input(s): LIPASE, AMYLASE in the last 168 hours. No results for input(s): AMMONIA in the last 168 hours. CBC:  Recent Labs Lab 03/25/17 0507 03/25/17 1723 03/26/17 0219 03/27/17 0449 03/28/17 0810 03/29/17 0500  WBC 44.1*  --  49.2* 61.4* 58.8* 48.6*  NEUTROABS  --   --   --  59.6* 57.6*  --   HGB 8.8* 9.1* 9.1* 9.5* 9.4* 9.3*  HCT 29.6* 30.3* 30.3* 30.3* 31.0* 31.3*  MCV 78.5  --  78.5 78.9 78.7 80.3  PLT 250  --  326 480* 681* 727*   Cardiac Enzymes:  Recent Labs Lab 03/23/17 1710 03/23/17 2344 03/24/17 0951 03/24/17 1553 03/24/17 2123 03/27/17 1104  CKTOTAL  --   --   --   --   --  39  TROPONINI 0.06* 0.20* 0.09* 0.07* 0.08*  --    BNP: BNP (last 3 results)  Recent Labs  07/03/16 1828 02/12/17 1505 03/11/17 2115  BNP 678.0* 608.0* 1,374.8*    ProBNP (last 3 results) No results for input(s): PROBNP in the last 8760 hours.  CBG:  Recent Labs Lab 03/26/17 2327 03/27/17 0357 03/27/17 0805 03/27/17 1210 03/27/17 1554  GLUCAP 116* 117* 117* 111* 104*        Signed:  Aneudy Champlain  [facility] 03/29/2017, 12:30 PM

## 2017-03-29 NOTE — Care Management Note (Signed)
[facility] called for transport to [facility] and made aware pt is ready for Transport at any time.

## 2017-03-29 NOTE — Progress Notes (Signed)
Patient ready to be transferred to [facility] icu room 418. Report was given to receiving nurse. All questions answered at this time. Patient is stable for transport.

## 2017-03-29 NOTE — Progress Notes (Signed)
Received Call from [place], Susan Moore for [facility]. Pt has been accepted and will be going to room 418-ICU at [facility] after 1500 and Bedside RN can call [phone] to give RN-RN handoff when ready. Bed will be available after 1500, 03/29/2017. Elijio Miles, MD will be accepting Physician.

## 2017-03-29 NOTE — Progress Notes (Signed)
Received call from Stanton Kidney, RN that Ree Kida, MD was requesting transfer to [facility] today. [place], Rep from that agency had communicated bed availability and pts appropriateness for care there. CM LM with that Rep for advisement of same. @ [phone] as has Ree Kida, MD without response so far. Will continue to follow. Pt was given options of [facility] vs [facility] and she chooses whichever facility closest to [place].  12:15: received call from [facility] who is awaiting Bed assignment for this pt and will advise.

## 2017-03-31 NOTE — Progress Notes (Signed)
Ativan 1.5mg  wasted per protocol, Consuelo Pandy RN witness.

## 2017-04-01 LAB — CULTURE, BLOOD (ROUTINE X 2)
Culture: NO GROWTH
Culture: NO GROWTH
SPECIAL REQUESTS: ADEQUATE

## 2017-04-02 ENCOUNTER — Telehealth: Payer: Self-pay | Admitting: Vascular Surgery

## 2017-04-02 NOTE — Telephone Encounter (Signed)
-----   Message from Mena Goes, RN sent at 04/01/2017 10:10 AM EDT ----- Regarding: 2-3 weeks    ----- Message ----- From: Ulyses Amor, PA-C Sent: 04/01/2017   9:47 AM To: Mena Goes, RN Subject: RE: When?                                      2-3 weeks ----- Message ----- From: Mena Goes, RN Sent: 03/29/2017   9:01 PM To: Ulyses Amor, PA-C Subject: When?                                            ----- Message ----- From: Ulyses Amor, PA-C Sent: 03/29/2017   8:29 AM To: Vvs Charge Pool  F/U with Dr. [name] s/p right LE limb salvage fem-pop

## 2017-04-02 NOTE — Telephone Encounter (Signed)
Sched appt 04/17/17 at 11:30. Lm on  Hm#.

## 2017-04-07 ENCOUNTER — Other Ambulatory Visit (HOSPITAL_COMMUNITY): Payer: Medicare Other

## 2017-04-07 ENCOUNTER — Ambulatory Visit (HOSPITAL_COMMUNITY): Payer: Medicare Other | Admitting: Adult Health

## 2017-04-17 ENCOUNTER — Encounter: Payer: Medicare Other | Admitting: Vascular Surgery

## 2017-04-17 DIAGNOSIS — J9600 Acute respiratory failure, unspecified whether with hypoxia or hypercapnia: Secondary | ICD-10-CM | POA: Diagnosis not present

## 2017-04-17 DIAGNOSIS — B351 Tinea unguium: Secondary | ICD-10-CM | POA: Diagnosis not present

## 2017-04-17 DIAGNOSIS — L03119 Cellulitis of unspecified part of limb: Secondary | ICD-10-CM | POA: Diagnosis not present

## 2017-04-17 DIAGNOSIS — I5043 Acute on chronic combined systolic (congestive) and diastolic (congestive) heart failure: Secondary | ICD-10-CM | POA: Diagnosis not present

## 2017-04-17 DIAGNOSIS — T814XXA Infection following a procedure, initial encounter: Secondary | ICD-10-CM | POA: Diagnosis not present

## 2017-04-17 DIAGNOSIS — J189 Pneumonia, unspecified organism: Secondary | ICD-10-CM | POA: Diagnosis not present

## 2017-04-17 DIAGNOSIS — J9621 Acute and chronic respiratory failure with hypoxia: Secondary | ICD-10-CM | POA: Diagnosis not present

## 2017-04-17 DIAGNOSIS — Z23 Encounter for immunization: Secondary | ICD-10-CM | POA: Diagnosis not present

## 2017-04-17 DIAGNOSIS — I482 Chronic atrial fibrillation: Secondary | ICD-10-CM | POA: Diagnosis not present

## 2017-04-17 DIAGNOSIS — Z7952 Long term (current) use of systemic steroids: Secondary | ICD-10-CM | POA: Diagnosis not present

## 2017-04-17 DIAGNOSIS — S81802A Unspecified open wound, left lower leg, initial encounter: Secondary | ICD-10-CM | POA: Diagnosis not present

## 2017-04-17 DIAGNOSIS — Z9911 Dependence on respirator [ventilator] status: Secondary | ICD-10-CM | POA: Diagnosis not present

## 2017-04-17 DIAGNOSIS — J90 Pleural effusion, not elsewhere classified: Secondary | ICD-10-CM | POA: Diagnosis not present

## 2017-04-17 DIAGNOSIS — J9620 Acute and chronic respiratory failure, unspecified whether with hypoxia or hypercapnia: Secondary | ICD-10-CM | POA: Diagnosis not present

## 2017-04-17 DIAGNOSIS — I739 Peripheral vascular disease, unspecified: Secondary | ICD-10-CM | POA: Diagnosis not present

## 2017-04-17 DIAGNOSIS — D45 Polycythemia vera: Secondary | ICD-10-CM | POA: Diagnosis not present

## 2017-04-17 DIAGNOSIS — R5381 Other malaise: Secondary | ICD-10-CM | POA: Diagnosis not present

## 2017-04-17 DIAGNOSIS — J441 Chronic obstructive pulmonary disease with (acute) exacerbation: Secondary | ICD-10-CM | POA: Diagnosis not present

## 2017-04-17 DIAGNOSIS — I744 Embolism and thrombosis of arteries of extremities, unspecified: Secondary | ICD-10-CM | POA: Diagnosis not present

## 2017-04-17 DIAGNOSIS — I70269 Atherosclerosis of native arteries of extremities with gangrene, unspecified extremity: Secondary | ICD-10-CM | POA: Diagnosis not present

## 2017-04-17 DIAGNOSIS — I999 Unspecified disorder of circulatory system: Secondary | ICD-10-CM | POA: Diagnosis not present

## 2017-04-17 DIAGNOSIS — I4891 Unspecified atrial fibrillation: Secondary | ICD-10-CM | POA: Diagnosis not present

## 2017-04-17 DIAGNOSIS — J449 Chronic obstructive pulmonary disease, unspecified: Secondary | ICD-10-CM | POA: Diagnosis not present

## 2017-04-17 DIAGNOSIS — R262 Difficulty in walking, not elsewhere classified: Secondary | ICD-10-CM | POA: Diagnosis not present

## 2017-04-17 DIAGNOSIS — R6521 Severe sepsis with septic shock: Secondary | ICD-10-CM | POA: Diagnosis not present

## 2017-04-17 DIAGNOSIS — M6281 Muscle weakness (generalized): Secondary | ICD-10-CM | POA: Diagnosis not present

## 2017-04-20 DIAGNOSIS — Z9911 Dependence on respirator [ventilator] status: Secondary | ICD-10-CM | POA: Diagnosis not present

## 2017-04-20 DIAGNOSIS — J9600 Acute respiratory failure, unspecified whether with hypoxia or hypercapnia: Secondary | ICD-10-CM | POA: Diagnosis not present

## 2017-04-28 DIAGNOSIS — J9600 Acute respiratory failure, unspecified whether with hypoxia or hypercapnia: Secondary | ICD-10-CM | POA: Diagnosis not present

## 2017-04-28 DIAGNOSIS — Z9911 Dependence on respirator [ventilator] status: Secondary | ICD-10-CM | POA: Diagnosis not present

## 2017-05-01 ENCOUNTER — Ambulatory Visit (HOSPITAL_COMMUNITY): Payer: Medicare Other

## 2017-05-01 ENCOUNTER — Other Ambulatory Visit (HOSPITAL_COMMUNITY): Payer: Medicare Other

## 2017-05-09 DIAGNOSIS — R5381 Other malaise: Secondary | ICD-10-CM | POA: Diagnosis not present

## 2017-05-09 DIAGNOSIS — J9620 Acute and chronic respiratory failure, unspecified whether with hypoxia or hypercapnia: Secondary | ICD-10-CM | POA: Diagnosis not present

## 2017-05-09 DIAGNOSIS — J441 Chronic obstructive pulmonary disease with (acute) exacerbation: Secondary | ICD-10-CM | POA: Diagnosis not present

## 2017-05-15 ENCOUNTER — Encounter: Payer: Self-pay | Admitting: Vascular Surgery

## 2017-05-15 ENCOUNTER — Ambulatory Visit (INDEPENDENT_AMBULATORY_CARE_PROVIDER_SITE_OTHER): Payer: Self-pay | Admitting: Vascular Surgery

## 2017-05-15 VITALS — BP 85/61 | HR 118 | Temp 97.6°F | Resp 20 | Ht 72.0 in | Wt 133.0 lb

## 2017-05-15 DIAGNOSIS — I739 Peripheral vascular disease, unspecified: Secondary | ICD-10-CM

## 2017-05-15 NOTE — Progress Notes (Signed)
Subjective:     Patient ID: Charlotte [name], female   DOB: 05/27/1941, 76 y.o.   MRN: [id number]  HPI 76 year old female follows up from right femoral to tibioperoneal trunk bypass with in situ vein. Postoperatively she had some complications with runoff but ultimately the bypass was noted to be patent after her blood pressure stabilized. She remained intubated for several days with hemodynamic monitoring alternate was extubated. She still has staples in place and is now in [facility]. She has a foot drop which is present from preoperative ischemic nature of her foot. She is on anticoagulation as well as aspirin at this time. She is not walking at this time. She still has staples in place and has had stabilized gangrenous changes to her right foot. Overall she is feeling better than when she was in the hospital but has failed to regain much strength.    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 By physical exam the bypass remains patent all the she does have some wounds on the right medial leg today. Her staples were just now removed 8 weeks postop today she does have persistent dry gangrenous changes on the foot as well as a foot drop on the right. She does remain very weak and in [facility] at this time. She remains a very high risk of above-knee amputation on the right but I would not do anything at this time given that she has only dry gangrenous  changes that may improve as her health improves. I've asked that she gets Betadine painting to the dry gangrenous areas as well as packing of the right medial leg wound. I will follow her up in a few weeks with an ABI and duplex the right lower extremity at which time I can also check the wounds. Husband demonstrates good understanding we will see her in a few weeks.    Charlotte [name] C. [name], MD [facility] Office: [phone] Pager: [phone]

## 2017-05-18 DIAGNOSIS — R5381 Other malaise: Secondary | ICD-10-CM | POA: Diagnosis not present

## 2017-05-18 DIAGNOSIS — M6281 Muscle weakness (generalized): Secondary | ICD-10-CM | POA: Diagnosis not present

## 2017-05-18 DIAGNOSIS — R262 Difficulty in walking, not elsewhere classified: Secondary | ICD-10-CM | POA: Diagnosis not present

## 2017-05-19 NOTE — Addendum Note (Signed)
Addended by: Lianne Cure A on: 05/19/2017 09:36 AM   Modules accepted: Orders

## 2017-05-21 DIAGNOSIS — M6281 Muscle weakness (generalized): Secondary | ICD-10-CM | POA: Diagnosis not present

## 2017-05-21 DIAGNOSIS — R262 Difficulty in walking, not elsewhere classified: Secondary | ICD-10-CM | POA: Diagnosis not present

## 2017-05-21 DIAGNOSIS — R5381 Other malaise: Secondary | ICD-10-CM | POA: Diagnosis not present

## 2017-05-25 DIAGNOSIS — R262 Difficulty in walking, not elsewhere classified: Secondary | ICD-10-CM | POA: Diagnosis not present

## 2017-05-25 DIAGNOSIS — M6281 Muscle weakness (generalized): Secondary | ICD-10-CM | POA: Diagnosis not present

## 2017-05-25 DIAGNOSIS — R5381 Other malaise: Secondary | ICD-10-CM | POA: Diagnosis not present

## 2017-05-27 DIAGNOSIS — M6281 Muscle weakness (generalized): Secondary | ICD-10-CM | POA: Diagnosis not present

## 2017-05-27 DIAGNOSIS — R5381 Other malaise: Secondary | ICD-10-CM | POA: Diagnosis not present

## 2017-05-27 DIAGNOSIS — R262 Difficulty in walking, not elsewhere classified: Secondary | ICD-10-CM | POA: Diagnosis not present

## 2017-05-28 ENCOUNTER — Inpatient Hospital Stay (HOSPITAL_COMMUNITY)
Admission: EM | Admit: 2017-05-28 | Discharge: 2017-06-01 | DRG: 871 | Disposition: A | Discharge status: Home Health | Payer: Medicare Other | Attending: Family Medicine | Admitting: Family Medicine

## 2017-05-28 ENCOUNTER — Encounter (HOSPITAL_COMMUNITY): Payer: Self-pay | Admitting: Emergency Medicine

## 2017-05-28 ENCOUNTER — Emergency Department (HOSPITAL_COMMUNITY): Payer: Medicare Other

## 2017-05-28 DIAGNOSIS — A419 Sepsis, unspecified organism: Secondary | ICD-10-CM | POA: Diagnosis present

## 2017-05-28 DIAGNOSIS — J440 Chronic obstructive pulmonary disease with acute lower respiratory infection: Secondary | ICD-10-CM | POA: Diagnosis present

## 2017-05-28 DIAGNOSIS — H9190 Unspecified hearing loss, unspecified ear: Secondary | ICD-10-CM | POA: Diagnosis present

## 2017-05-28 DIAGNOSIS — I96 Gangrene, not elsewhere classified: Secondary | ICD-10-CM | POA: Diagnosis present

## 2017-05-28 DIAGNOSIS — Z7901 Long term (current) use of anticoagulants: Secondary | ICD-10-CM | POA: Diagnosis not present

## 2017-05-28 DIAGNOSIS — E876 Hypokalemia: Secondary | ICD-10-CM | POA: Diagnosis present

## 2017-05-28 DIAGNOSIS — Z79899 Other long term (current) drug therapy: Secondary | ICD-10-CM | POA: Diagnosis not present

## 2017-05-28 DIAGNOSIS — I739 Peripheral vascular disease, unspecified: Secondary | ICD-10-CM | POA: Diagnosis present

## 2017-05-28 DIAGNOSIS — I10 Essential (primary) hypertension: Secondary | ICD-10-CM | POA: Diagnosis present

## 2017-05-28 DIAGNOSIS — K219 Gastro-esophageal reflux disease without esophagitis: Secondary | ICD-10-CM | POA: Diagnosis present

## 2017-05-28 DIAGNOSIS — D751 Secondary polycythemia: Secondary | ICD-10-CM | POA: Diagnosis present

## 2017-05-28 DIAGNOSIS — E038 Other specified hypothyroidism: Secondary | ICD-10-CM | POA: Diagnosis not present

## 2017-05-28 DIAGNOSIS — I959 Hypotension, unspecified: Secondary | ICD-10-CM | POA: Diagnosis present

## 2017-05-28 DIAGNOSIS — L89613 Pressure ulcer of right heel, stage 3: Secondary | ICD-10-CM | POA: Diagnosis present

## 2017-05-28 DIAGNOSIS — I482 Chronic atrial fibrillation, unspecified: Secondary | ICD-10-CM | POA: Diagnosis present

## 2017-05-28 DIAGNOSIS — I110 Hypertensive heart disease with heart failure: Secondary | ICD-10-CM | POA: Diagnosis present

## 2017-05-28 DIAGNOSIS — N179 Acute kidney failure, unspecified: Secondary | ICD-10-CM | POA: Diagnosis present

## 2017-05-28 DIAGNOSIS — T447X5A Adverse effect of beta-adrenoreceptor antagonists, initial encounter: Secondary | ICD-10-CM | POA: Diagnosis not present

## 2017-05-28 DIAGNOSIS — Z86718 Personal history of other venous thrombosis and embolism: Secondary | ICD-10-CM | POA: Diagnosis not present

## 2017-05-28 DIAGNOSIS — I5042 Chronic combined systolic (congestive) and diastolic (congestive) heart failure: Secondary | ICD-10-CM | POA: Diagnosis present

## 2017-05-28 DIAGNOSIS — Z7982 Long term (current) use of aspirin: Secondary | ICD-10-CM

## 2017-05-28 DIAGNOSIS — E785 Hyperlipidemia, unspecified: Secondary | ICD-10-CM | POA: Diagnosis present

## 2017-05-28 DIAGNOSIS — I952 Hypotension due to drugs: Secondary | ICD-10-CM

## 2017-05-28 DIAGNOSIS — L03115 Cellulitis of right lower limb: Secondary | ICD-10-CM | POA: Diagnosis not present

## 2017-05-28 DIAGNOSIS — Z66 Do not resuscitate: Secondary | ICD-10-CM | POA: Diagnosis present

## 2017-05-28 DIAGNOSIS — F419 Anxiety disorder, unspecified: Secondary | ICD-10-CM | POA: Diagnosis present

## 2017-05-28 DIAGNOSIS — R6521 Severe sepsis with septic shock: Secondary | ICD-10-CM | POA: Diagnosis present

## 2017-05-28 DIAGNOSIS — Z4682 Encounter for fitting and adjustment of non-vascular catheter: Secondary | ICD-10-CM | POA: Diagnosis not present

## 2017-05-28 DIAGNOSIS — J189 Pneumonia, unspecified organism: Secondary | ICD-10-CM | POA: Diagnosis present

## 2017-05-28 DIAGNOSIS — L0390 Cellulitis, unspecified: Secondary | ICD-10-CM | POA: Diagnosis present

## 2017-05-28 DIAGNOSIS — F4489 Other dissociative and conversion disorders: Secondary | ICD-10-CM | POA: Diagnosis not present

## 2017-05-28 DIAGNOSIS — L8990 Pressure ulcer of unspecified site, unspecified stage: Secondary | ICD-10-CM | POA: Diagnosis present

## 2017-05-28 DIAGNOSIS — E039 Hypothyroidism, unspecified: Secondary | ICD-10-CM | POA: Diagnosis present

## 2017-05-28 HISTORY — DX: Anxiety disorder, unspecified: F41.9

## 2017-05-28 HISTORY — DX: Gastro-esophageal reflux disease without esophagitis: K21.9

## 2017-05-28 HISTORY — DX: Chronic obstructive pulmonary disease, unspecified: J44.9

## 2017-05-28 HISTORY — DX: Gangrene, not elsewhere classified: I96

## 2017-05-28 LAB — CBC WITH DIFFERENTIAL/PLATELET
BASOS ABS: 0.4 10*3/uL — AB (ref 0.0–0.1)
BASOS PCT: 2 %
EOS ABS: 0.6 10*3/uL (ref 0.0–0.7)
EOS PCT: 3 %
HCT: 30.5 % — ABNORMAL LOW (ref 36.0–46.0)
Hemoglobin: 8.8 g/dL — ABNORMAL LOW (ref 12.0–15.0)
LYMPHS PCT: 5 %
Lymphs Abs: 1.1 10*3/uL (ref 0.7–4.0)
MCH: 24 pg — ABNORMAL LOW (ref 26.0–34.0)
MCHC: 28.9 g/dL — ABNORMAL LOW (ref 30.0–36.0)
MCV: 83.1 fL (ref 78.0–100.0)
Monocytes Absolute: 0.5 10*3/uL (ref 0.1–1.0)
Monocytes Relative: 3 %
Neutro Abs: 18.9 10*3/uL — ABNORMAL HIGH (ref 1.7–7.7)
Neutrophils Relative %: 88 %
PLATELETS: 512 10*3/uL — AB (ref 150–400)
RBC: 3.67 MIL/uL — AB (ref 3.87–5.11)
RDW: 22.1 % — AB (ref 11.5–15.5)
WBC: 21.5 10*3/uL — AB (ref 4.0–10.5)

## 2017-05-28 LAB — COMPREHENSIVE METABOLIC PANEL
ALBUMIN: 2.4 g/dL — AB (ref 3.5–5.0)
ALT: 8 U/L — AB (ref 14–54)
AST: 14 U/L — AB (ref 15–41)
Alkaline Phosphatase: 94 U/L (ref 38–126)
Anion gap: 12 (ref 5–15)
BUN: 46 mg/dL — AB (ref 6–20)
CHLORIDE: 104 mmol/L (ref 101–111)
CO2: 20 mmol/L — AB (ref 22–32)
Calcium: 7.8 mg/dL — ABNORMAL LOW (ref 8.9–10.3)
Creatinine, Ser: 2.48 mg/dL — ABNORMAL HIGH (ref 0.44–1.00)
GFR calc Af Amer: 21 mL/min — ABNORMAL LOW (ref >60)
GFR calc non Af Amer: 18 mL/min — ABNORMAL LOW (ref >60)
GLUCOSE: 82 mg/dL (ref 65–99)
POTASSIUM: 3.4 mmol/L — AB (ref 3.5–5.1)
SODIUM: 136 mmol/L (ref 135–145)
Total Bilirubin: 0.5 mg/dL (ref 0.3–1.2)
Total Protein: 5.3 g/dL — ABNORMAL LOW (ref 6.5–8.1)

## 2017-05-28 LAB — LACTIC ACID, PLASMA
LACTIC ACID, VENOUS: 2.5 mmol/L — AB (ref 0.5–1.9)
LACTIC ACID, VENOUS: 2 mmol/L — AB (ref 0.5–1.9)

## 2017-05-28 LAB — PROTIME-INR
INR: 1.74
PROTHROMBIN TIME: 20.2 s — AB (ref 11.4–15.2)

## 2017-05-28 LAB — TSH: TSH: 11.084 u[IU]/mL — ABNORMAL HIGH (ref 0.350–4.500)

## 2017-05-28 LAB — BRAIN NATRIURETIC PEPTIDE: B Natriuretic Peptide: 605 pg/mL — ABNORMAL HIGH (ref 0.0–100.0)

## 2017-05-28 LAB — PROCALCITONIN: PROCALCITONIN: 0.1 ng/mL

## 2017-05-28 LAB — I-STAT CG4 LACTIC ACID, ED: LACTIC ACID, VENOUS: 2.33 mmol/L — AB (ref 0.5–1.9)

## 2017-05-28 MED ORDER — LEVOTHYROXINE SODIUM 75 MCG PO TABS
75.0000 ug | ORAL_TABLET | Freq: Every day | ORAL | Status: DC
  Administered 2017-05-29 – 2017-06-01 (×4): 75 ug via ORAL
  Filled 2017-05-28 (×4): qty 1

## 2017-05-28 MED ORDER — LACTATED RINGERS IV BOLUS (SEPSIS)
1000.0000 mL | Freq: Once | INTRAVENOUS | Status: AC
  Administered 2017-05-28: 1000 mL via INTRAVENOUS

## 2017-05-28 MED ORDER — MIDODRINE HCL 5 MG PO TABS
5.0000 mg | ORAL_TABLET | Freq: Three times a day (TID) | ORAL | Status: DC
  Administered 2017-05-28 – 2017-06-01 (×13): 5 mg via ORAL
  Filled 2017-05-28 (×15): qty 1

## 2017-05-28 MED ORDER — AMIODARONE HCL 200 MG PO TABS
200.0000 mg | ORAL_TABLET | Freq: Every day | ORAL | Status: DC
  Administered 2017-05-29 – 2017-06-01 (×4): 200 mg via ORAL
  Filled 2017-05-28 (×4): qty 1

## 2017-05-28 MED ORDER — HYDROXYUREA 500 MG PO CAPS
1000.0000 mg | ORAL_CAPSULE | Freq: Every day | ORAL | Status: DC
  Administered 2017-05-29 – 2017-06-01 (×4): 1000 mg via ORAL
  Filled 2017-05-28 (×4): qty 2

## 2017-05-28 MED ORDER — SODIUM CHLORIDE 0.9 % IV BOLUS (SEPSIS)
500.0000 mL | Freq: Once | INTRAVENOUS | Status: AC
  Administered 2017-05-28: 500 mL via INTRAVENOUS

## 2017-05-28 MED ORDER — HEPARIN (PORCINE) IN NACL 100-0.45 UNIT/ML-% IJ SOLN
850.0000 [IU]/h | INTRAMUSCULAR | Status: DC
  Administered 2017-05-28: 900 [IU]/h via INTRAVENOUS
  Filled 2017-05-28: qty 250

## 2017-05-28 MED ORDER — ASPIRIN EC 81 MG PO TBEC
81.0000 mg | DELAYED_RELEASE_TABLET | Freq: Every day | ORAL | Status: DC
  Administered 2017-05-29 – 2017-06-01 (×4): 81 mg via ORAL
  Filled 2017-05-28 (×4): qty 1

## 2017-05-28 MED ORDER — LACTATED RINGERS IV SOLN
INTRAVENOUS | Status: DC
  Administered 2017-05-28: 23:00:00 via INTRAVENOUS

## 2017-05-28 MED ORDER — PIPERACILLIN-TAZOBACTAM 3.375 G IVPB
3.3750 g | Freq: Three times a day (TID) | INTRAVENOUS | Status: DC
  Administered 2017-05-29 – 2017-06-01 (×11): 3.375 g via INTRAVENOUS
  Filled 2017-05-28 (×11): qty 50

## 2017-05-28 MED ORDER — POTASSIUM CHLORIDE CRYS ER 20 MEQ PO TBCR
40.0000 meq | EXTENDED_RELEASE_TABLET | Freq: Once | ORAL | Status: AC
  Administered 2017-05-28: 40 meq via ORAL
  Filled 2017-05-28: qty 2

## 2017-05-28 MED ORDER — PRAVASTATIN SODIUM 10 MG PO TABS
10.0000 mg | ORAL_TABLET | Freq: Every day | ORAL | Status: DC
  Administered 2017-05-28 – 2017-05-31 (×4): 10 mg via ORAL
  Filled 2017-05-28 (×4): qty 1

## 2017-05-28 MED ORDER — PIPERACILLIN-TAZOBACTAM 3.375 G IVPB 30 MIN
3.3750 g | Freq: Once | INTRAVENOUS | Status: AC
  Administered 2017-05-28: 3.375 g via INTRAVENOUS
  Filled 2017-05-28: qty 50

## 2017-05-28 MED ORDER — DILTIAZEM HCL ER COATED BEADS 240 MG PO CP24
240.0000 mg | ORAL_CAPSULE | Freq: Every day | ORAL | Status: DC
  Administered 2017-05-29 – 2017-06-01 (×3): 240 mg via ORAL
  Filled 2017-05-28 (×4): qty 1

## 2017-05-28 MED ORDER — VANCOMYCIN HCL IN DEXTROSE 1-5 GM/200ML-% IV SOLN
1000.0000 mg | Freq: Once | INTRAVENOUS | Status: AC
  Administered 2017-05-28: 1000 mg via INTRAVENOUS
  Filled 2017-05-28: qty 200

## 2017-05-28 MED ORDER — SODIUM CHLORIDE 0.9 % IV SOLN
500.0000 mg | INTRAVENOUS | Status: DC
  Administered 2017-05-29 – 2017-06-01 (×4): 500 mg via INTRAVENOUS
  Filled 2017-05-28 (×4): qty 500

## 2017-05-28 MED ORDER — SODIUM CHLORIDE 0.9 % IV BOLUS (SEPSIS): 2000.0000 mL | Freq: Once | INTRAVENOUS | Status: DC

## 2017-05-28 MED ORDER — GABAPENTIN 100 MG PO CAPS
100.0000 mg | ORAL_CAPSULE | Freq: Three times a day (TID) | ORAL | Status: DC
  Administered 2017-05-28 – 2017-06-01 (×12): 100 mg via ORAL
  Filled 2017-05-28 (×12): qty 1

## 2017-05-28 MED ORDER — SODIUM CHLORIDE 0.9 % IV BOLUS (SEPSIS)
1000.0000 mL | Freq: Once | INTRAVENOUS | Status: AC
  Administered 2017-05-28: 1000 mL via INTRAVENOUS

## 2017-05-28 NOTE — ED Provider Notes (Signed)
[facility] EMERGENCY DEPARTMENT Provider Note   CSN: [id number] Arrival date & time: 05/28/17  1526     History   Chief Complaint Chief Complaint  Patient presents with  . Hypotension    HPI Charlotte [name] is a 76 y.o. female.  Patient was seen by home health nurse today.  Home health nurse got her blood pressure at 60 systolic and centered to the emergency department.  She has been in the hospital and then rehab for a number of months from sepsis from surgery to her right leg for peripheral vascular disease.  Patient states she feels fine.  Further investigation shows that the husband accidentally gave her Lopressor 100 mg p.o. today she is supposed to take 25 mg twice a day if her systolic blood pressures over 110.  She also is supposed to be on vancomycin and has not taken that because they do not have it   The history is provided by the patient and the spouse. No language interpreter was used.  Illness  This is a chronic problem. The current episode started 12 to 24 hours ago. The problem occurs constantly. The problem has not changed since onset.Pertinent negatives include no chest pain, no abdominal pain and no headaches. Nothing aggravates the symptoms. Nothing relieves the symptoms. She has tried nothing for the symptoms. The treatment provided no relief.    Past Medical History:  Diagnosis Date  . Arthritis   . Atrial fibrillation, chronic (HCC)    a. on Coumadin for anticoagulation.   . CHF (congestive heart failure) ([place])   . Deaf   . Diastolic heart failure ([place])   . DVT (deep venous thrombosis) ([place])   . Essential hypertension   . Hyperlipidemia   . Hypothyroidism   . On home O2   . Peripheral neuropathy   . Peripheral vascular disease ([place])   . 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 Ulcer of ankle ([place])    Bilateral 2015  . Varicose veins     Patient Active Problem List   Diagnosis Date Noted  . Pressure injury of skin 03/14/2017  . Acute renal  failure (ARF) ([place])   . Hyperlipidemia 03/05/2017  . Lower extremity arterial insufficiency, severe, right ([place]) 03/04/2017  . Chronic respiratory failure with hypoxia ([place]) 02/12/2017  . CAP (community acquired pneumonia) 02/12/2017  . Cellulitis 07/03/2016  . Anemia 07/03/2016  . Cough 11/29/2015  . Hypoxemia 11/29/2015  . Community acquired pneumonia 11/29/2015  . CHF (congestive heart failure) ([place]) 11/29/2015  . Acute on chronic respiratory failure with hypoxia ([place])   . Pleural effusion on left   . Acute on chronic diastolic (congestive) heart failure ([place])   . Dyspnea 11/22/2014  . Pleural effusion 11/22/2014  . Hard of hearing 11/22/2014  . Thrombocytosis ([place]) 11/22/2014  . Acute respiratory failure with hypoxemia ([place]) 11/22/2014  . Encounter for therapeutic drug monitoring 08/31/2013  . Aftercare following surgery of the circulatory system, [place] 12/21/2012  . Pain in limb 10/19/2012  . Peripheral vascular disease ([place]) 09/24/2012  . Swollen leg 09/24/2012  . Varicose veins of lower extremities with other complications 76/28/3151  . 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 Wound infection after surgery 01/07/2012  . Compartment syndrome, nontraumatic, lower extremity 11/29/2011  . Pain of right thigh 11/28/2011  . Hip fracture, right ([place]) 11/15/2011  . Chronic atrial fibrillation ([place]) 11/15/2011  . Anticoagulant long-term use 11/15/2011  .  HTN (hypertension) 11/15/2011  . Hypothyroidism 11/15/2011  . Polycythemia vera ([place]) 10/01/2011    Past Surgical History:  Procedure Laterality Date  . ABDOMINAL AORTAGRAM N/A 09/14/2012   Procedure: ABDOMINAL [name];  Surgeon: Serafina Mitchell, MD;  Location: [facility] CATH LAB;  Service: Cardiovascular;  Laterality: N/A;  . ABDOMINAL AORTOGRAM N/A 03/06/2017   Procedure: ABDOMINAL AORTOGRAM;  Surgeon: Elam Dutch, MD;  Location: [place]  CV LAB;  Service: Cardiovascular;  Laterality: N/A;  . ABDOMINAL HYSTERECTOMY    . CORONARY ANGIOPLASTY    . DRESSING CHANGE UNDER ANESTHESIA  12/02/2011   Procedure: DRESSING CHANGE UNDER ANESTHESIA;  Surgeon: Carole Civil, MD;  [facility]: [facility];  Service: Orthopedics;  Laterality: Right;  . ENDARTERECTOMY FEMORAL Right 09/15/2012   Procedure: ENDARTERECTOMY FEMORAL;  Surgeon: Mal Misty, MD;  Location: [place];  Service: Vascular;  Laterality: Right;  . FASCIOTOMY  11/29/2011   Procedure: FASCIOTOMY;  Surgeon: Carole Civil, MD;  Location: [facility];  Service: Orthopedics;  Laterality: Right;  right thigh   . FEMORAL-POPLITEAL BYPASS GRAFT Right 09/15/2012   Procedure: BYPASS GRAFT FEMORAL-POPLITEAL ARTERY;  Surgeon: Mal Misty, MD;  Location: [place];  Service: Vascular;  Laterality: Right;  . FEMORAL-TIBIAL BYPASS GRAFT Right 03/11/2017   Procedure: RIGHT FEMORAL-TIBIAL BYPASS IN-SITU GREATER SAPHENOUS VEIN;  Surgeon: Waynetta Sandy, MD;  Location: [place];  Service: Vascular;  Laterality: Right;  . HIP PINNING,CANNULATED  11/19/2011   Procedure: CANNULATED HIP PINNING;  Surgeon: Sanjuana Kava, MD;  [facility];  Service: Orthopedics;  Laterality: Right;  . LOWER EXTREMITY ANGIOGRAPHY Bilateral 03/06/2017   Procedure: Lower Extremity Angiography;  Surgeon: Elam Dutch, MD;  Location: [place] CV LAB;  Service: Cardiovascular;  Laterality: Bilateral;  . PATCH ANGIOPLASTY Right 09/15/2012   Procedure: PATCH ANGIOPLASTY;  Surgeon: Mal Misty, MD;  Location: [place];  Service: Vascular;  Laterality: Right;  . TUBAL LIGATION      OB History    No data available       Home Medications    Prior to Admission medications   Medication Sig Start Date End Date Taking? Authorizing Provider  acetaminophen (TYLENOL) 325 MG tablet Take 2 tablets (650 mg total) by mouth every 6 (six) hours as needed for mild pain or fever. 03/29/17  Yes Mikhail, Velta Addison, DO  amiodarone  (PACERONE) 200 MG tablet Take 1 tablet (200 mg total) by mouth 2 (two) times daily. Patient taking differently: Take 200 mg by mouth daily.  03/29/17  Yes Mikhail, Velta Addison, DO  diltiazem (CARDIZEM CD) 240 MG 24 hr capsule Take 240 mg by mouth daily.   Yes [provider]  furosemide (LASIX) 40 MG tablet Take 40 mg by mouth daily.   Yes [provider]  furosemide (LASIX) 80 MG tablet Take 80 mg by mouth daily.   Yes [provider]  gabapentin (NEURONTIN) 100 MG capsule Take 100 mg by mouth 3 (three) times daily.     Yes [provider]  hydroxyurea (HYDREA) 500 MG capsule Take 2 capsules (1,000 mg total) by mouth daily. May take with food to minimize GI side effects. 03/03/17  Yes Twana First, MD  metolazone (ZAROXOLYN) 2.5 MG tablet Take 2.5 mg by mouth every Monday, Wednesday, and Friday.   Yes [provider]  metoprolol tartrate (LOPRESSOR) 100 MG tablet Take 100 mg by mouth 2 (two) times daily.   Yes [provider]  pravastatin (PRAVACHOL) 20 MG tablet Take 10 mg  by mouth daily.   Yes [provider]  torsemide (DEMADEX) 20 MG tablet Take 40 mg by mouth daily.   Yes [provider]  vancomycin 500 mg in sodium chloride 0.9 % 100 mL Inject 500 mg into the vein daily.   Yes [provider]  warfarin (COUMADIN) 5 MG tablet Take 2.5-5 mg by mouth See admin instructions. Take one tablet all days except take 2.5mg  on MWF   Yes [provider]  apixaban (ELIQUIS) 5 MG TABS tablet Take 5 mg by mouth 2 (two) times daily.    [provider]  aspirin EC 81 MG tablet Take 81 mg by mouth daily.    [provider]  furosemide 250 mg in dextrose 5 % 225 mL Inject 8 mg/hr into the vein continuous. Patient not taking: Reported on 05/28/2017 03/29/17   Cristal Ford, DO  heparin 100-0.45 UNIT/ML-% infusion Inject 1,750 Units/hr into the vein continuous. Patient not taking: Reported on 05/28/2017  03/29/17   Cristal Ford, DO  Hydrocortisone (GERHARDT'S BUTT CREAM) CREA Apply 1 application topically daily. Patient not taking: Reported on 05/28/2017 03/30/17   Cristal Ford, DO  hydrocortisone sodium succinate (SOLU-CORTEF) 100 MG SOLR injection Inject 1 mL (50 mg total) into the vein every 6 (six) hours. Patient not taking: Reported on 05/28/2017 03/29/17   Cristal Ford, DO  levothyroxine (SYNTHROID, LEVOTHROID) 75 MCG tablet Take 75 mcg by mouth daily before breakfast.  10/16/14   [provider]  LORazepam (ATIVAN) 2 MG/ML injection Inject 0.25 mLs (0.5 mg total) into the vein every 6 (six) hours as needed for anxiety. Patient not taking: Reported on 05/28/2017 03/29/17   Cristal Ford, DO  midodrine (PROAMATINE) 5 MG tablet Take 1 tablet (5 mg total) by mouth 3 (three) times daily with meals. Patient not taking: Reported on 05/28/2017 03/29/17   Cristal Ford, DO  morphine 4 MG/ML injection Inject 0.5-1 mLs (2-4 mg total) into the vein every 4 (four) hours as needed for moderate pain. Patient not taking: Reported on 05/28/2017 03/29/17   Cristal Ford, DO  mouth rinse LIQD solution 15 mLs by Mouth Rinse route 2 (two) times daily. Patient not taking: Reported on 05/28/2017 03/29/17   Cristal Ford, DO  ondansetron (ZOFRAN) 4 MG tablet Take 1 tablet (4 mg total) by mouth every 6 (six) hours as needed for nausea. Patient not taking: Reported on 05/28/2017 03/29/17   Cristal Ford, DO  pantoprazole (PROTONIX) 20 MG tablet Take 1 tablet (20 mg total) by mouth daily at 12 noon. Patient not taking: Reported on 05/28/2017 03/30/17   Cristal Ford, DO  piperacillin-tazobactam (ZOSYN) 3.375 GM/50ML IVPB Inject 50 mLs (3.375 g total) into the vein every 8 (eight) hours. Patient not taking: Reported on 05/28/2017 03/29/17   Cristal Ford, DO  potassium chloride (KLOR-CON M15) 15 MEQ tablet Take 2 tablets (30 mEq total) by mouth 2 (two) times daily. Patient not taking:  Reported on 05/28/2017 03/29/17   Cristal Ford, DO  potassium chloride 20 MEQ/15ML (10%) SOLN Take 30 mLs (40 mEq total) by mouth daily. Patient not taking: Reported on 05/28/2017 03/30/17   Cristal Ford, DO    Family History Family History  Problem Relation Age of Onset  . Heart failure Mother   . Heart disease Mother   . Stroke Father   . Heart disease Sister   . Diabetes Son   . Hypertension Sister   . Hypertension Sister   . Stroke Sister     Social History  Social History  Substance Use Topics  . Smoking status: Never Smoker  . Smokeless tobacco: Never Used  . Alcohol use No     Allergies   Patient has no known allergies.   Review of Systems Review of Systems  Constitutional: Negative for appetite change and fatigue.  HENT: Negative for congestion, ear discharge and sinus pressure.   Eyes: Negative for discharge.  Respiratory: Negative for cough.   Cardiovascular: Negative for chest pain.  Gastrointestinal: Negative for abdominal pain and diarrhea.  Genitourinary: Negative for frequency and hematuria.  Musculoskeletal: Negative for back pain.  Skin: Negative for rash.  Neurological: Negative for seizures and headaches.  Psychiatric/Behavioral: Negative for hallucinations.     Physical Exam Updated Vital Signs BP (!) 83/53   Pulse 62   Temp (!) 96.9 F (36.1 C) (Rectal)   Resp 14   Ht 6' (1.829 m)   Wt 60.3 kg (133 lb)   SpO2 100%   BMI 18.04 kg/m   Physical Exam  Constitutional: She is oriented to person, place, and time. She appears well-developed.  HENT:  Head: Normocephalic.  Eyes: Conjunctivae and EOM are normal. No scleral icterus.  Neck: Neck supple. No thyromegaly present.  Cardiovascular: Normal rate and regular rhythm.  Exam reveals no gallop and no friction rub.   No murmur heard. Pulmonary/Chest: No stridor. She has no wheezes. She has no rales. She exhibits no tenderness.  Abdominal: She exhibits no distension. There is no  tenderness. There is no rebound.  Musculoskeletal: Normal range of motion. She exhibits no edema.  Patient had cellulitis right leg that seems to be healing  Lymphadenopathy:    She has no cervical adenopathy.  Neurological: She is oriented to person, place, and time. She exhibits normal muscle tone. Coordination normal.  Skin: No rash noted. No erythema.  Psychiatric: She has a normal mood and affect. Her behavior is normal.     ED Treatments / Results  Labs (all labs ordered are listed, but only abnormal results are displayed) Labs Reviewed  COMPREHENSIVE METABOLIC PANEL - Abnormal; Notable for the following:       Result Value   Potassium 3.4 (*)    CO2 20 (*)    BUN 46 (*)    Creatinine, Ser 2.48 (*)    Calcium 7.8 (*)    Total Protein 5.3 (*)    Albumin 2.4 (*)    AST 14 (*)    ALT 8 (*)    GFR calc non Af Amer 18 (*)    GFR calc Af Amer 21 (*)    All other components within normal limits  CBC WITH DIFFERENTIAL/PLATELET - Abnormal; Notable for the following:    WBC 21.5 (*)    RBC 3.67 (*)    Hemoglobin 8.8 (*)    HCT 30.5 (*)    MCH 24.0 (*)    MCHC 28.9 (*)    RDW 22.1 (*)    Platelets 512 (*)    Neutro Abs 18.9 (*)    Basophils Absolute 0.4 (*)    All other components within normal limits  BRAIN NATRIURETIC PEPTIDE - Abnormal; Notable for the following:    B Natriuretic Peptide 605.0 (*)    All other components within normal limits  I-STAT CG4 LACTIC ACID, ED - Abnormal; Notable for the following:    Lactic Acid, Venous 2.33 (*)    All other components within normal limits  CULTURE, BLOOD (ROUTINE X 2)  CULTURE, BLOOD (ROUTINE X 2)  URINALYSIS, ROUTINE W REFLEX MICROSCOPIC  PROTIME-INR  LACTIC ACID, PLASMA    EKG  EKG Interpretation  Date/Time:  Thursday May 28 2017 15:58:56 EDT Ventricular Rate:  57 PR Interval:    QRS Duration: 99 QT Interval:  502 QTC Calculation: 489 R Axis:   99 Text Interpretation:  Atrial fibrillation Right axis  deviation RSR' in V1 or V2, probably normal variant Borderline prolonged QT interval Confirmed by Milton Ferguson [phone]) on 05/28/2017 6:04:00 PM       Radiology Dg Chest Portable 1 View  Result Date: 05/28/2017 CLINICAL DATA:  Central catheter placement.  Hypotension. EXAM: PORTABLE CHEST 1 VIEW COMPARISON:  March 28, 2017 FINDINGS: Central catheter tip is in the superior vena cava. No pneumothorax. There is patchy airspace opacity in the medial left lung base. There are minimal pleural effusions bilaterally. Lungs elsewhere are clear. There is borderline cardiomegaly with mild pulmonary venous hypertension. No adenopathy. No bone lesions. IMPRESSION: Central catheter tip in superior vena cava. No pneumothorax. Pulmonary vascular congestion with small pleural effusions bilaterally. Medial airspace consolidation in the left base is likely due to superimposed pneumonia. Followup PA and lateral chest radiographs recommended in 3-4 weeks following trial of antibiotic therapy to ensure resolution and exclude underlying malignancy. Electronically Signed   By: Lowella Grip III M.D.   On: 05/28/2017 15:48    Procedures Procedures (including critical care time)  Medications Ordered in ED Medications  midodrine (PROAMATINE) tablet 5 mg (5 mg Oral Given 05/28/17 1746)  sodium chloride 0.9 % bolus 1,000 mL (0 mLs Intravenous Stopped 05/28/17 1748)  vancomycin (VANCOCIN) IVPB 1000 mg/200 mL premix (1,000 mg Intravenous New Bag/Given 05/28/17 1700)  piperacillin-tazobactam (ZOSYN) IVPB 3.375 g (0 g Intravenous Stopped 05/28/17 1652)  sodium chloride 0.9 % bolus 500 mL (500 mLs Intravenous New Bag/Given 05/28/17 1748)     Initial Impression / Assessment and Plan / ED Course  I have reviewed the triage vital signs and the nursing notes.  Pertinent labs & imaging results that were available during my care of the patient were reviewed by me and considered in my medical decision making (see chart for  details). CRITICAL CARE Performed by: Jadalynn Burr L Total critical care time: 45 minutes Critical care time was exclusive of separately billable procedures and treating other patients. Critical care was necessary to treat or prevent imminent or life-threatening deterioration. Critical care was time spent personally by me on the following activities: development of treatment plan with patient and/or surrogate as well as nursing, discussions with consultants, evaluation of patient's response to treatment, examination of patient, obtaining history from patient or surrogate, ordering and performing treatments and interventions, ordering and review of laboratory studies, ordering and review of radiographic studies, pulse oximetry and re-evaluation of patient's condition.   Patient with hypotension most likely related to medication.  She still will be treated as a possible sepsis and admitted    Final Clinical Impressions(s) / ED Diagnoses   Final diagnoses:  Hypotension due to drugs    New Prescriptions New Prescriptions   No medications on file     Milton Ferguson, MD 05/28/17 [phone]

## 2017-05-28 NOTE — Progress Notes (Signed)
Pharmacy Antibiotic Note  Charlotte [name] is a 76 y.o. female admitted on 05/28/2017 with sepsis and a fib  Pharmacy has been consulted for vancomycin, zosyn and heparin dosing.Vanc 1 gm and zosyn 3.375 gm given in ED  Plan: Cont vanc 500 mg IV q24 hours Cont zosyn 3.375 gm IV q8 hours Start heparin drip with no bolus at 900 units/hr Check heparin level and CBC with am labs F/u renal function, cultures and clinical course  Height: 6' (182.9 cm) Weight: 138 lb 10.7 oz (62.9 kg) IBW/kg (Calculated) : 73.1  Temp (24hrs), Avg:97.2 F (36.2 C), Min:96.9 F (36.1 C), Max:97.5 F (36.4 C)   Recent Labs Lab 05/28/17 1616 05/28/17 1620 05/28/17 1828  WBC 21.5*  --   --   CREATININE 2.48*  --   --   LATICACIDVEN  --  2.33* 2.5*    Estimated Creatinine Clearance: 19.8 mL/min (A) (by C-G formula based on SCr of 2.48 mg/dL (H)).    No Known Allergies   Thank you for allowing pharmacy to be a part of this patient's care.  Excell Seltzer [place] 05/28/2017 10:55 PM

## 2017-05-28 NOTE — H&P (Addendum)
History and Physical    Charlotte [name] NFA:[id number] DOB: Feb 21, 1941 DOA: 05/28/2017  PCP: [name], MD Consultants:  [name] - vascular Patient coming from:  Home - lives with husband; discharged from [place] yesterday after 2 months, having been at [facility] for a month prior; NOK: Husband, [phone]  Chief Complaint: hypotension  HPI: Charlotte [name] is a 76 y.o. 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  She was admitted to [facility] from 8/1-26 for septic shock with limb ischemia and cellulitis and was discharged to [facility]).  Patient was discharged from [place] yesterday.  She slept well overnight.  This AM, she took her BP medication - she took 100 mg metoprolol at 0500 (records are conflicting but it appears that the plan was to decrease this medication to 25 mg daily and she also has been on midodrine at [facility] for hypotension).  She was feeling good, but the home health nurse came and checked her BP and it was 60/40.  She is "supposed to be taking the kind to bring it up instead of the kind to lower it."  The home health nurse recommended that she come in for evaluation.  No SOB.  She had been on home O2 prior but they took her off it at [place].  No cough or wheezing.  She had dysuria at [facility], diagnosed with a yeast infection.  She has not complained of further urinary symptoms.  She has been taking Lasix for fluid and she took it yesterday.  ?foot swelling on the right only, which they thought was due to her bandage being too tight.  She is also supposed to be receiving Vanc infusions for RLE cellulitis but they were unable to afford the Vanc and so she did not receive her home  dose today.  Finally, they were previously on Coumadin and appear to have been changed to Eliquis, but they are unable to afford this medication.  ED Course:   Patient with hypotension most likely related to medication. She still will be treated as a possible sepsis and admitted   Review of Systems: As per HPI; otherwise review of systems reviewed and negative.   The patient is visually and hearing impaired and communication is very difficult and so primarily done through her husband.  Therefore the ROS is limited.  Ambulatory Status:  bedbound since the surgery  Past Medical History:  Diagnosis Date  . Anxiety   . Arthritis   . Atrial fibrillation, chronic (HCC)    a. on Coumadin for anticoagulation.   . CHF (congestive heart failure) ([place])    combined  . COPD (chronic obstructive pulmonary disease) ([place])    on home O2  . Deaf   . Dry gangrene ([place])    R foot  . DVT (deep venous thrombosis) ([place])   . Essential hypertension   . GERD (gastroesophageal reflux disease)   . Hyperlipidemia   . Hypothyroidism   . Peripheral neuropathy   . Peripheral vascular disease ([place])   . 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 Ulcer of ankle ([place])    Bilateral 2015  . Varicose veins     Past Surgical History:  Procedure Laterality Date  . ABDOMINAL AORTAGRAM N/A 09/14/2012   Procedure: ABDOMINAL AORTAGRAM;  Surgeon:  Serafina Mitchell, MD;  Location: [facility] CATH LAB;  Service: Cardiovascular;  Laterality: N/A;  . ABDOMINAL AORTOGRAM N/A 03/06/2017   Procedure: ABDOMINAL AORTOGRAM;  Surgeon: Elam Dutch, MD;  Location: [place] CV LAB;  Service: Cardiovascular;  Laterality: N/A;  . ABDOMINAL HYSTERECTOMY    . CORONARY ANGIOPLASTY    . DRESSING CHANGE UNDER ANESTHESIA  12/02/2011   Procedure: DRESSING CHANGE UNDER ANESTHESIA;  Surgeon: Carole Civil, MD;  Location: [facility] ORS;  Service: Orthopedics;  Laterality: Right;  . ENDARTERECTOMY FEMORAL Right 09/15/2012   Procedure: ENDARTERECTOMY  FEMORAL;  Surgeon: Mal Misty, MD;  Location: [place];  Service: Vascular;  Laterality: Right;  . FASCIOTOMY  11/29/2011   Procedure: FASCIOTOMY;  Surgeon: Carole Civil, MD;  Location: [facility] ORS;  Service: Orthopedics;  Laterality: Right;  right thigh   . FEMORAL-POPLITEAL BYPASS GRAFT Right 09/15/2012   Procedure: BYPASS GRAFT FEMORAL-POPLITEAL ARTERY;  Surgeon: Mal Misty, MD;  Location: [place];  Service: Vascular;  Laterality: Right;  . FEMORAL-TIBIAL BYPASS GRAFT Right 03/11/2017   Procedure: RIGHT FEMORAL-TIBIAL BYPASS IN-SITU GREATER SAPHENOUS VEIN;  Surgeon: Waynetta Sandy, MD;  Location: [place];  Service: Vascular;  Laterality: Right;  . HIP PINNING,CANNULATED  11/19/2011   Procedure: CANNULATED HIP PINNING;  Surgeon: Sanjuana Kava, MD;  Location: [facility];  Service: Orthopedics;  Laterality: Right;  . LOWER EXTREMITY ANGIOGRAPHY Bilateral 03/06/2017   Procedure: Lower Extremity Angiography;  Surgeon: Elam Dutch, MD;  Location: [place] [facility] CV LAB;  Service: Cardiovascular;  Laterality: Bilateral;  . PATCH ANGIOPLASTY Right 09/15/2012   Procedure: PATCH ANGIOPLASTY;  Surgeon: Mal Misty, MD;  Location: [place];  Service: Vascular;  Laterality: Right;  . TUBAL LIGATION      Social History   Social History  . Marital status: Married    Spouse name: N/A  . Number of children: N/A  . Years of education: N/A   Occupational History  . Not on file.   Social History Main Topics  . Smoking status: Never Smoker  . Smokeless tobacco: Never Used  . Alcohol use No  . Drug use: No  . Sexual activity: Not Currently   Other Topics Concern  . Not on file   Social History Narrative  . No narrative on file    No Known Allergies  Family History  Problem Relation Age of Onset  . Heart failure Mother   . Heart disease Mother   . Stroke Father   . Heart disease Sister   . Diabetes Son   . Hypertension Sister   . Hypertension Sister   . Stroke Sister     Prior  to Admission medications   Medication Sig Start Date End Date Taking? Authorizing Provider  acetaminophen (TYLENOL) 325 MG tablet Take 2 tablets (650 mg total) by mouth every 6 (six) hours as needed for mild pain or fever. 03/29/17  Yes Mikhail, Velta Addison, DO  amiodarone (PACERONE) 200 MG tablet Take 1 tablet (200 mg total) by mouth 2 (two) times daily. Patient taking differently: Take 200 mg by mouth daily.  03/29/17  Yes Mikhail, Velta Addison, DO  diltiazem (CARDIZEM CD) 240 MG 24 hr capsule Take 240 mg by mouth daily.   Yes [provider]  furosemide (LASIX) 40 MG tablet Take 40 mg by mouth daily.   Yes [provider]  furosemide (LASIX) 80 MG tablet Take 80 mg by mouth daily.   Yes [provider]  gabapentin (NEURONTIN) 100 MG capsule Take  100 mg by mouth 3 (three) times daily.     Yes [provider]  hydroxyurea (HYDREA) 500 MG capsule Take 2 capsules (1,000 mg total) by mouth daily. May take with food to minimize GI side effects. 03/03/17  Yes Twana First, MD  metolazone (ZAROXOLYN) 2.5 MG tablet Take 2.5 mg by mouth every Monday, Wednesday, and Friday.   Yes [provider]  metoprolol tartrate (LOPRESSOR) 100 MG tablet Take 100 mg by mouth 2 (two) times daily.   Yes [provider]  pravastatin (PRAVACHOL) 20 MG tablet Take 10 mg by mouth daily.   Yes [provider]  torsemide (DEMADEX) 20 MG tablet Take 40 mg by mouth daily.   Yes [provider]  vancomycin 500 mg in sodium chloride 0.9 % 100 mL Inject 500 mg into the vein daily.   Yes [provider]  warfarin (COUMADIN) 5 MG tablet Take 2.5-5 mg by mouth See admin instructions. Take one tablet all days except take 2.5mg  on MWF   Yes [provider]  apixaban (ELIQUIS) 5 MG TABS tablet Take 5 mg by mouth 2 (two) times daily.    [provider]  aspirin EC 81 MG tablet Take 81 mg by mouth daily.    [provider]  furosemide 250 mg in  dextrose 5 % 225 mL Inject 8 mg/hr into the vein continuous. Patient not taking: Reported on 05/28/2017 03/29/17   Cristal Ford, DO  heparin 100-0.45 UNIT/ML-% infusion Inject 1,750 Units/hr into the vein continuous. Patient not taking: Reported on 05/28/2017 03/29/17   Cristal Ford, DO  Hydrocortisone (GERHARDT'S BUTT CREAM) CREA Apply 1 application topically daily. Patient not taking: Reported on 05/28/2017 03/30/17   Cristal Ford, DO  hydrocortisone sodium succinate (SOLU-CORTEF) 100 MG SOLR injection Inject 1 mL (50 mg total) into the vein every 6 (six) hours. Patient not taking: Reported on 05/28/2017 03/29/17   Cristal Ford, DO  levothyroxine (SYNTHROID, LEVOTHROID) 75 MCG tablet Take 75 mcg by mouth daily before breakfast.  10/16/14   [provider]  LORazepam (ATIVAN) 2 MG/ML injection Inject 0.25 mLs (0.5 mg total) into the vein every 6 (six) hours as needed for anxiety. Patient not taking: Reported on 05/28/2017 03/29/17   Cristal Ford, DO  midodrine (PROAMATINE) 5 MG tablet Take 1 tablet (5 mg total) by mouth 3 (three) times daily with meals. Patient not taking: Reported on 05/28/2017 03/29/17   Cristal Ford, DO  morphine 4 MG/ML injection Inject 0.5-1 mLs (2-4 mg total) into the vein every 4 (four) hours as needed for moderate pain. Patient not taking: Reported on 05/28/2017 03/29/17   Cristal Ford, DO  mouth rinse LIQD solution 15 mLs by Mouth Rinse route 2 (two) times daily. Patient not taking: Reported on 05/28/2017 03/29/17   Cristal Ford, DO  ondansetron (ZOFRAN) 4 MG tablet Take 1 tablet (4 mg total) by mouth every 6 (six) hours as needed for nausea. Patient not taking: Reported on 05/28/2017 03/29/17   Cristal Ford, DO  pantoprazole (PROTONIX) 20 MG tablet Take 1 tablet (20 mg total) by mouth daily at 12 noon. Patient not taking: Reported on 05/28/2017 03/30/17   Cristal Ford, DO  piperacillin-tazobactam (ZOSYN) 3.375 GM/50ML IVPB Inject  50 mLs (3.375 g total) into the vein every 8 (eight) hours. Patient not taking: Reported on 05/28/2017 03/29/17   Cristal Ford, DO  potassium chloride (KLOR-CON M15) 15 MEQ tablet Take 2 tablets (30 mEq total) by mouth 2 (two) times daily. Patient not  taking: Reported on 05/28/2017 03/29/17   Cristal Ford, DO  potassium chloride 20 MEQ/15ML (10%) SOLN Take 30 mLs (40 mEq total) by mouth daily. Patient not taking: Reported on 05/28/2017 03/30/17   Cristal Ford, DO    Physical Exam: Vitals:   05/28/17 2020 05/28/17 2040 05/28/17 2100 05/28/17 2120  BP: (!) 79/45 (!) 83/51 (!) 82/50 96/60  Pulse: 60 (!) 59 (!) 55 (!) 58  Resp: 15 16 20 20   Temp:      TempSrc:      SpO2: 99% 98% 100% 100%  Weight:      Height:         General:  Appears calm and comfortable and is NAD Eyes:  PERRL, EOMI, normal lids, iris ENT: very hard of hearing, normal lips & tongue Neck:  no LAD, masses or thyromegaly Cardiovascular:  RRR, no m/r/g. No LE edema.  Respiratory:   CTA bilaterally with no wheezes/rales/rhonchi.  Normal respiratory effort. Abdomen:  soft, NT, ND, NABS Skin/MSK: She has 2 persistent wounds with packing in place to her right medial upper calf; there is mild to moderate erythema of the right lower leg particularly from the mid-calf proximally; there is a stage 2-3 pressure ulcer on her right heel; there is dry gangrene of the 2nd, 4th, and 5th toes Psychiatric: appears pleasant, speech sparse Neurologic:  CN 2-12 grossly intact, moves all extremities in coordinated fashion    Radiological Exams on Admission: Dg Chest Portable 1 View  Result Date: 05/28/2017 CLINICAL DATA:  Central catheter placement.  Hypotension. EXAM: PORTABLE CHEST 1 VIEW COMPARISON:  March 28, 2017 FINDINGS: Central catheter tip is in the superior vena cava. No pneumothorax. There is patchy airspace opacity in the medial left lung base. There are minimal pleural effusions bilaterally. Lungs elsewhere are  clear. There is borderline cardiomegaly with mild pulmonary venous hypertension. No adenopathy. No bone lesions. IMPRESSION: Central catheter tip in superior vena cava. No pneumothorax. Pulmonary vascular congestion with small pleural effusions bilaterally. Medial airspace consolidation in the left base is likely due to superimposed pneumonia. Followup PA and lateral chest radiographs recommended in 3-4 weeks following trial of antibiotic therapy to ensure resolution and exclude underlying malignancy. Electronically Signed   By: Lowella Grip III M.D.   On: 05/28/2017 15:48    EKG: Independently reviewed.  Afib with rate 57;  no evidence of acute ischemia   Labs on Admission: I have personally reviewed the available labs and imaging studies at the time of the admission.  Pertinent labs:   CO2 20 BUN 46/Creatinine 2.48/GFR 18; prior 30/1.41/36 on 8/26 Calcium 7.8 Albumin 2.4 BNP 605, previously 1375 Lactate 2.33, 2.5 WBC 21.5; 48.6 on 8/26 Hgb 8.8; 9.3 on 8/26 Platelets 512 INR 1.74   Assessment/Plan Principal Problem:   Sepsis (HCC) Active Problems:   Chronic atrial fibrillation (HCC)   Anticoagulant long-term use   HTN (hypertension)   Hypothyroidism   Peripheral vascular disease (HCC)   Cellulitis   Acute renal failure (ARF) (HCC)   Hypotension   Sepsis with hypotension, likely due to cellulitis/PVD -Elevated WBC count, hypothermia, tachypnea with elevated lactate to 3.2 and hypotension -While awaiting blood cultures, this appears to be a preseptic condition with possible shock physiology -She has responded to IVF thus far and so does not currently appear to require pressors -Her hypotension is likely exacerbated by poor health literacy on the part of her husband and possibly poor discharge instructions from [facility] - she appears to have been discharged with  midodrine as well as Cardizem and Lopressor and the husband was reportedly giving 100mg  BID instead of the changed  dose of 25 mg once daily.  Additionally, she appears to have been prescribed Lasix both 40 mg and 80 mg daily; Zaroxolyn 3 times weekly; and Torsemide daily. -Additionally, while the patient was diagnosed with cellulitis and reportedly encouraged to continue Vancomycin daily through her CVL, the patient and her husband were unable to obtain this medication. -All of these social issues likely contributed to her hypotension and worsening sepsis, necessitating admission. -Sepsis protocol initiated -For now, suspect cellulitis as the cause for the sepsis, in conjunction with ongoing critical limb ischemia and dry gangrene -Transfer to [facility] offered for vascular surgery consultation and the patient's husband requested that the patient remain at [facility] instead for a trial of therapy -Blood and urine cultures pending -Will admit to SDU and continue to monitor -Treat with IV Vanc and Zosyn -Will trend lactate to ensure improvement -Will order procalcitonin level.  Antibiotics would not be indicated for PCT <0.1 and probably should not be used for < 0.25.  >0.5 indicates infection and >>0.5 indicates more serious disease.  As the procalcitonin level normalizes, it will be reasonable to consider de-escalation of antibiotic coverage.  Acute renal failure -Renal failure is likely due to poor PO intake in conjunction with overdiuresis in conjunction with increased metabolic demands from sepsis -Will hydrate and follow -Of note, the most recent labs available are from her prior [facility] admission; she has been at [facility] for the last 2 months and her current lab results may be entirely different than those from her last [facility] admission  Afib on [facility] -This is another area of confusion, as the patient has both Eliquis and Coumadin on her medication list -It appears that she was previously taking Coumadin at the time of last [facility] hospitalization -Instead, it appears that this was changed to Eliquis while at [place] -However,  the patient and her husband are unable to afford this and so she is currently not on [facility] -Will start heparin per pharmacy for now -Request CM assistance -Continue Cardizem and Amiodarone for rate control  HTN -Hold all HTN medications other than Cardizem for rate control  Hypothyroidism -Abnormal thyroid function tests during prior hospitalization -It has been 8 weeks so will repeat now  -Synthroid does not appear to be on her current medication list   DVT prophylaxis:  Lovenox  Code Status: DNR - confirmed with patient/family Family Communication: Husband present throughout evaluation Disposition Plan: To be determined Consults called:  Wound care, CM, SW Admission status: Admit - It is my clinical opinion that admission to INPATIENT is reasonable and necessary because this patient will require at least 2 midnights in the hospital to treat this condition based on the medical complexity of the problems presented.  Given the aforementioned information, the predictability of an adverse outcome is felt to be significant.   Total critical care time: 70 minutes Critical care time was exclusive of separately billable procedures and treating other patients. Critical care was necessary to treat or prevent imminent or life-threatening deterioration. Critical care was time spent personally by me on the following activities: development of treatment plan with patient and/or surrogate as well as nursing, discussions with consultants, evaluation of patient's response to treatment, examination of patient, obtaining history from patient or surrogate, ordering and performing treatments and interventions, ordering and review of laboratory studies, ordering and review of radiographic studies, pulse oximetry and re-evaluation of patient's condition.  Karmen Bongo MD [facility]  If note is complete, please contact covering daytime or nighttime physician. [email or web address] Password  [facility]  05/28/2017, 9:54 PM

## 2017-05-28 NOTE — ED Triage Notes (Signed)
Pt sent to ED by [facility] Nurse for hypotension. BP-60/40 reported. Pt has no complaints. BP-94/59 upon arrival.

## 2017-05-29 ENCOUNTER — Encounter (HOSPITAL_COMMUNITY): Payer: Self-pay | Admitting: Family Medicine

## 2017-05-29 DIAGNOSIS — L03115 Cellulitis of right lower limb: Secondary | ICD-10-CM

## 2017-05-29 DIAGNOSIS — Z7901 Long term (current) use of anticoagulants: Secondary | ICD-10-CM

## 2017-05-29 DIAGNOSIS — I482 Chronic atrial fibrillation: Secondary | ICD-10-CM

## 2017-05-29 DIAGNOSIS — I739 Peripheral vascular disease, unspecified: Secondary | ICD-10-CM

## 2017-05-29 DIAGNOSIS — I959 Hypotension, unspecified: Secondary | ICD-10-CM

## 2017-05-29 DIAGNOSIS — I10 Essential (primary) hypertension: Secondary | ICD-10-CM

## 2017-05-29 DIAGNOSIS — A419 Sepsis, unspecified organism: Principal | ICD-10-CM

## 2017-05-29 DIAGNOSIS — N179 Acute kidney failure, unspecified: Secondary | ICD-10-CM

## 2017-05-29 DIAGNOSIS — L8990 Pressure ulcer of unspecified site, unspecified stage: Secondary | ICD-10-CM | POA: Diagnosis present

## 2017-05-29 LAB — COMPREHENSIVE METABOLIC PANEL
ALBUMIN: 2.2 g/dL — AB (ref 3.5–5.0)
ALK PHOS: 95 U/L (ref 38–126)
ALT: 6 U/L — ABNORMAL LOW (ref 14–54)
AST: 14 U/L — AB (ref 15–41)
Anion gap: 11 (ref 5–15)
BILIRUBIN TOTAL: 0.5 mg/dL (ref 0.3–1.2)
BUN: 40 mg/dL — AB (ref 6–20)
CALCIUM: 7.4 mg/dL — AB (ref 8.9–10.3)
CO2: 19 mmol/L — ABNORMAL LOW (ref 22–32)
CREATININE: 2.07 mg/dL — AB (ref 0.44–1.00)
Chloride: 105 mmol/L (ref 101–111)
GFR calc Af Amer: 26 mL/min — ABNORMAL LOW (ref >60)
GFR calc non Af Amer: 22 mL/min — ABNORMAL LOW (ref >60)
GLUCOSE: 69 mg/dL (ref 65–99)
Potassium: 3.9 mmol/L (ref 3.5–5.1)
Sodium: 135 mmol/L (ref 135–145)
TOTAL PROTEIN: 4.8 g/dL — AB (ref 6.5–8.1)

## 2017-05-29 LAB — CBC
HEMATOCRIT: 29.1 % — AB (ref 36.0–46.0)
Hemoglobin: 8.4 g/dL — ABNORMAL LOW (ref 12.0–15.0)
MCH: 23.9 pg — ABNORMAL LOW (ref 26.0–34.0)
MCHC: 28.9 g/dL — ABNORMAL LOW (ref 30.0–36.0)
MCV: 82.9 fL (ref 78.0–100.0)
Platelets: 532 10*3/uL — ABNORMAL HIGH (ref 150–400)
RBC: 3.51 MIL/uL — AB (ref 3.87–5.11)
RDW: 22.3 % — ABNORMAL HIGH (ref 11.5–15.5)
WBC: 21.6 10*3/uL — AB (ref 4.0–10.5)

## 2017-05-29 LAB — HEPARIN LEVEL (UNFRACTIONATED)
Heparin Unfractionated: >2.2 IU/mL — ABNORMAL HIGH (ref 0.30–0.70)
Heparin Unfractionated: 2.2 IU/mL — ABNORMAL HIGH (ref 0.30–0.70)

## 2017-05-29 LAB — LACTIC ACID, PLASMA: Lactic Acid, Venous: 2 mmol/L (ref 0.5–1.9)

## 2017-05-29 LAB — APTT
APTT: 98 s — AB (ref 24–36)
aPTT: 113 seconds — ABNORMAL HIGH (ref 24–36)
aPTT: 72 seconds — ABNORMAL HIGH (ref 24–36)

## 2017-05-29 LAB — PREALBUMIN: Prealbumin: 16.8 mg/dL — ABNORMAL LOW (ref 18–38)

## 2017-05-29 LAB — MRSA PCR SCREENING: MRSA by PCR: NEGATIVE

## 2017-05-29 LAB — C-REACTIVE PROTEIN: CRP: 1.3 mg/dL — ABNORMAL HIGH (ref <1.0)

## 2017-05-29 LAB — SEDIMENTATION RATE: Sed Rate: 5 mm/hr (ref 0–22)

## 2017-05-29 LAB — T4, FREE: Free T4: 1.23 ng/dL — ABNORMAL HIGH (ref 0.61–1.12)

## 2017-05-29 MED ORDER — CHLORHEXIDINE GLUCONATE CLOTH 2 % EX PADS
6.0000 | MEDICATED_PAD | Freq: Every day | CUTANEOUS | Status: DC
  Administered 2017-05-29 – 2017-05-30 (×2): 6 via TOPICAL

## 2017-05-29 MED ORDER — ENOXAPARIN SODIUM 60 MG/0.6ML ~~LOC~~ SOLN
60.0000 mg | SUBCUTANEOUS | Status: DC
  Filled 2017-05-29 (×2): qty 0.6

## 2017-05-29 MED ORDER — ENSURE ENLIVE PO LIQD
237.0000 mL | Freq: Two times a day (BID) | ORAL | Status: DC
  Administered 2017-05-30 – 2017-06-01 (×5): 237 mL via ORAL

## 2017-05-29 MED ORDER — SODIUM CHLORIDE 0.9 % IV SOLN
INTRAVENOUS | Status: DC
  Administered 2017-05-29 – 2017-05-30 (×3): via INTRAVENOUS

## 2017-05-29 MED ORDER — SODIUM CHLORIDE 0.9% FLUSH: 10.0000 mL | INTRAVENOUS | Status: DC | PRN

## 2017-05-29 MED ORDER — SODIUM CHLORIDE 0.9 % IV SOLN: INTRAVENOUS | Status: DC

## 2017-05-29 MED ORDER — SODIUM CHLORIDE 0.9% FLUSH
10.0000 mL | Freq: Two times a day (BID) | INTRAVENOUS | Status: DC
  Administered 2017-05-29 (×2): 10 mL
  Administered 2017-05-30: 20 mL
  Administered 2017-05-30 – 2017-06-01 (×4): 10 mL

## 2017-05-29 MED ORDER — WARFARIN SODIUM 5 MG PO TABS
5.0000 mg | ORAL_TABLET | Freq: Once | ORAL | Status: AC
  Administered 2017-05-29: 5 mg via ORAL
  Filled 2017-05-29: qty 1

## 2017-05-29 MED ORDER — WARFARIN - PHARMACIST DOSING INPATIENT
Freq: Every day | Status: DC
  Administered 2017-05-30 – 2017-06-01 (×3)

## 2017-05-29 MED ORDER — COLLAGENASE 250 UNIT/GM EX OINT
TOPICAL_OINTMENT | Freq: Every day | CUTANEOUS | Status: DC
  Administered 2017-05-29 – 2017-06-01 (×3): via TOPICAL
  Filled 2017-05-29: qty 30

## 2017-05-29 MED ORDER — ADULT MULTIVITAMIN W/MINERALS CH
1.0000 | ORAL_TABLET | Freq: Every day | ORAL | Status: DC
  Administered 2017-05-30 – 2017-06-01 (×3): 1 via ORAL
  Filled 2017-05-29 (×3): qty 1

## 2017-05-29 NOTE — Progress Notes (Signed)
ANTICOAGULATION CONSULT NOTE - Follow Up Consult  Pharmacy Consult for Heparin Indication: atrial fibrillation  No Known Allergies  Patient Measurements: Height: 6' (182.9 cm) Weight: 138 lb 10.7 oz (62.9 kg) IBW/kg (Calculated) : 73.1 HEPARIN DW (KG): 62.9  Vital Signs: Temp: 97.5 F (36.4 C) (10/26 0400) Temp Source: Oral (10/26 0400) BP: 105/52 (10/26 0600) Pulse Rate: 62 (10/26 0600)  Labs:  Recent Labs  05/28/17 1616 05/28/17 1737 05/29/17 0408  HGB 8.8*  --  8.4*  HCT 30.5*  --  29.1*  PLT 512*  --  532*  APTT  --   --  98*  LABPROT  --  20.2*  --   INR  --  1.74  --   HEPARINUNFRC  --   --  >2.20*  CREATININE 2.48*  --  2.07*    Estimated Creatinine Clearance: 23.7 mL/min (A) (by C-G formula based on SCr of 2.07 mg/dL (H)).   Medications:  Prescriptions Prior to Admission  Medication Sig Dispense Refill Last Dose  . acetaminophen (TYLENOL) 325 MG tablet Take 2 tablets (650 mg total) by mouth every 6 (six) hours as needed for mild pain or fever.   unknown  . amiodarone (PACERONE) 200 MG tablet Take 1 tablet (200 mg total) by mouth 2 (two) times daily. (Patient taking differently: Take 200 mg by mouth daily. )   Past Week at Unknown time  . diltiazem (CARDIZEM CD) 240 MG 24 hr capsule Take 240 mg by mouth daily.   Past Week at Unknown time  . furosemide (LASIX) 40 MG tablet Take 40 mg by mouth daily.   Past Week at Unknown time  . furosemide (LASIX) 80 MG tablet Take 80 mg by mouth daily.   Past Week at Unknown time  . gabapentin (NEURONTIN) 100 MG capsule Take 100 mg by mouth 3 (three) times daily.     Past Week at Unknown time  . hydroxyurea (HYDREA) 500 MG capsule Take 2 capsules (1,000 mg total) by mouth daily. May take with food to minimize GI side effects. 60 capsule 2 Past Week at Unknown time  . metolazone (ZAROXOLYN) 2.5 MG tablet Take 2.5 mg by mouth every Monday, Wednesday, and Friday.   Past Week at Unknown time  . metoprolol tartrate (LOPRESSOR)  100 MG tablet Take 100 mg by mouth 2 (two) times daily.   05/28/2017 at [place]  . pravastatin (PRAVACHOL) 20 MG tablet Take 10 mg by mouth daily.   Past Week at Unknown time  . torsemide (DEMADEX) 20 MG tablet Take 40 mg by mouth daily.   Past Week at Unknown time  . vancomycin 500 mg in sodium chloride 0.9 % 100 mL Inject 500 mg into the vein daily.     Marland Kitchen warfarin (COUMADIN) 5 MG tablet Take 2.5-5 mg by mouth See admin instructions. Take one tablet all days except take 2.5mg  on MWF   Past Week at Unknown time    Assessment: 76 yo female just discharged from [place] 05/27/17 after being in [facility] from 8/1-26 for septic shock with limb ischemia and cellulitis and was discharged to [facility]).  She was on Eliquis at [place] and discharged home with this but is unable to afford. She has been on Coumadin previously. In addition, she was supposed to be taking Vancomycin infusions for RLE cellulits but unable to afford.  Pharmacy asked to start heparin for afib. She still has some residual affect from the Eliquis at [facility] as evidenced by a therapeutic APTT  this AM of 0.98 but elevated Heparin level. Will continue to monitor both until they correlate  Goal of Therapy:  APTT 66-102s Heparin level 0.3-0.7 units/ml Monitor platelets by anticoagulation protocol: Yes   Plan:  Contine heparin infusion at 900 units/hr Check APTT and anti-Xa level in 8 hours and daily while on heparin Continue to monitor H&H and platelets  Isac Sarna, BS Vena Austria, BCPS Clinical Pharmacist Pager [phone] 05/29/2017,8:12 AM

## 2017-05-29 NOTE — Care Management Note (Addendum)
Case Management Note  Patient Details  Name: Charlotte [name] MRN: [id number] Date of Birth: [birth year]  Subjective/Objective:  Adm with sepsis. 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 Patient was at [facility], then transferred to [facility]. Sent home earlier this week with home health. Apparently patient was to receive IV antibiotic infusions and this was not arranged prior to DC from LTAC. Spoke with Quita Skye of [place] who states Home health nurse with [facility] called to get infusions ordered through [facility]. When home health arrived to patient's home, she was hypotensive and told to come to ER. Discussed with patient who is not sure what happened. Discussed with patient's husband via phone. He states she was also started on Eliquis and they can not afford it. Benefits check for Eliquis in progress. [name] (husband) also states that [facility] was applying for Medicaid on patient's behalf. CM called Financial counselor to verify if Medicaid application is pending.                 Action/Plan: Husband to bring DC paperwork for CM to review and help address patients needs. Will get a benefits check for Eliquis. Patient is active with [facility] home health and [facility] is supplying IV antibiotics for patient. Hopefully patient can DC back home with resumption of HH and IV infusions. Patient will need PT eval when appropriate.   Addendum: Message received from [facility], Medicaid application will be filed during this visit.  Expected Discharge Date:       06/02/2017           Expected Discharge Plan:  [place]  In-House Referral:     Discharge planning Services  CM Consult, Medication Assistance  Post Acute Care Choice:  Home Health, Resumption of Svcs/PTA Provider Choice offered to:     DME Arranged:    DME Agency:     HH Arranged:    HH Agency:     Status of Service:  In process, will  continue to follow  If discussed at Long Length of Stay Meetings, dates discussed:    Additional Comments:  Ammon Muscatello, Chauncey Reading, RN 05/29/2017, 1:13 PM

## 2017-05-29 NOTE — Progress Notes (Signed)
ANTICOAGULATION CONSULT NOTE -  Pharmacy Consult for Heparin and Coumadin Indication: atrial fibrillation  No Known Allergies  Patient Measurements: Height: 6' (182.9 cm) Weight: 138 lb 10.7 oz (62.9 kg) IBW/kg (Calculated) : 73.1 HEPARIN DW (KG): 62.9  Vital Signs: Temp: 97.4 F (36.3 C) (10/26 0800) Temp Source: Oral (10/26 0800) BP: 97/67 (10/26 1500) Pulse Rate: 60 (10/26 1400)  Labs:  Recent Labs  05/28/17 1616 05/28/17 1737 05/29/17 0408 05/29/17 1208  HGB 8.8*  --  8.4*  --   HCT 30.5*  --  29.1*  --   PLT 512*  --  532*  --   APTT  --   --  98* 113*  LABPROT  --  20.2*  --   --   INR  --  1.74  --   --   HEPARINUNFRC  --   --  >2.20* 2.20*  CREATININE 2.48*  --  2.07*  --     Estimated Creatinine Clearance: 23.7 mL/min (A) (by C-G formula based on SCr of 2.07 mg/dL (H)).   Medications:  Prescriptions Prior to Admission  Medication Sig Dispense Refill Last Dose  . acetaminophen (TYLENOL) 325 MG tablet Take 2 tablets (650 mg total) by mouth every 6 (six) hours as needed for mild pain or fever.   unknown  . amiodarone (PACERONE) 200 MG tablet Take 1 tablet (200 mg total) by mouth 2 (two) times daily. (Patient taking differently: Take 200 mg by mouth daily. )   Past Week at Unknown time  . diltiazem (CARDIZEM CD) 240 MG 24 hr capsule Take 240 mg by mouth daily.   Past Week at Unknown time  . furosemide (LASIX) 40 MG tablet Take 40 mg by mouth daily.   Past Week at Unknown time  . furosemide (LASIX) 80 MG tablet Take 80 mg by mouth daily.   Past Week at Unknown time  . gabapentin (NEURONTIN) 100 MG capsule Take 100 mg by mouth 3 (three) times daily.     Past Week at Unknown time  . hydroxyurea (HYDREA) 500 MG capsule Take 2 capsules (1,000 mg total) by mouth daily. May take with food to minimize GI side effects. 60 capsule 2 Past Week at Unknown time  . metolazone (ZAROXOLYN) 2.5 MG tablet Take 2.5 mg by mouth every Monday, Wednesday, and Friday.   Past Week at  Unknown time  . metoprolol tartrate (LOPRESSOR) 100 MG tablet Take 100 mg by mouth 2 (two) times daily.   05/28/2017 at [place]  . pravastatin (PRAVACHOL) 20 MG tablet Take 10 mg by mouth daily.   Past Week at Unknown time  . torsemide (DEMADEX) 20 MG tablet Take 40 mg by mouth daily.   Past Week at Unknown time  . vancomycin 500 mg in sodium chloride 0.9 % 100 mL Inject 500 mg into the vein daily.     Marland Kitchen warfarin (COUMADIN) 5 MG tablet Take 2.5-5 mg by mouth See admin instructions. Take one tablet all days except take 2.5mg  on MWF   Past Week at Unknown time    Assessment: 76 yo female just discharged from [facility] 05/27/17 after being in [facility] from 8/1-26 for septic shock with limb ischemia and cellulitis and was discharged to [facility]).  She was on Eliquis at [facility] and discharged home with this but is unable to afford. She has been on Coumadin previously. In addition, she was supposed to be taking Vancomycin infusions for RLE cellulits but unable to afford.  Pharmacy asked to  start heparin for afib. She still has some residual affect from the Eliquis at [facility] as evidenced by a therapeutic APTT this AM of 0.98 but elevated Heparin level. Will continue to monitor both until they correlate. APTT slightly elevated, will adjust. Also, now starting Coumadin, patient unable to afford DOACs.  Goal of Therapy:  INR 2-3 APTT 66-102s Heparin level 0.3-0.7 units/ml Monitor platelets by anticoagulation protocol: Yes   Plan:  Decrease  heparin infusion at 850 units/hr Coumadin 5mg  po x 1 today Daily PT-INr Check APTT and anti-Xa level in 8 hours and daily while on heparin Continue to monitor H&H and platelets  Isac Sarna, BS [place], BCPS Clinical Pharmacist Pager [phone] 05/29/2017,3:43 PM

## 2017-05-29 NOTE — Consult Note (Addendum)
[facility] Nurse wound consult note Reason for Consult: Consult performed using remote camera and assistance from the bedside nurse for measurements and assessment.  Requested to assess wounds to RLE. Right toes are black dry eschar; no odor, drainage, or fluctuance. Right upper calf with 2 full thickness wounds which communicate below the surface level.  3X2X3cm, 15% yellow, 85% red, small amt tan drainage, no odor.  1.5X1.5X.3cm, 90% yellow, 10% red, small amt tan drainage, no odor. Wound type: Right heel with stage 2 pressure injury; 1.5X1.5X.1cm, 10% yellow, 90% red, small amt tan drainage, no odor. Pressure Injury POA: Yes Dressing procedure/placement/frequency: Float right heel in Prevalon boot to reduce pressure.  Foam dressing to protect from further injury.  Paint necrotic areas to right foot with Betadine,  Santyl ointment for enzymatic debridement of nonviable tissue to upper calf wounds. Discussed plan of care with patient and instructions provided for staff nurses to perform. Please re-consult if further assistance is needed.  Thank-you,  Julien Girt MSN, [place], [place], [place], [place]

## 2017-05-29 NOTE — Care Management (Signed)
S/W  [facility] @ [place] # [phone]    1. ELIQUIS  5 MG BID 30/60  COVER- YES  CO-PAY- $ 323.20  TIER- 3 DRUG  PRIOR APPROVAL- NO   2. ELIQUIS 2.5 MG BID 30/60  COVER- YES  CO-PAY- $ 323.20  TIER- 3 DRUG  PRIOR APPROVAL- NO   ( DEDUCTIBLE : HAS NOT BEEN MET )   3. XARELTO 15 MH BID  COVER- YES  CO-PAY- $ 419.06  TIER- 3 DRUG  PRIOR APPROVAL- NO   4. XARELTO  20 MG DAILY 30/30  COVER- YES  CO-PAY- $ 419.06  TIER- 3 DRUG  PRIOR APPROVAL- NO   ( DEDUCTIBLE : HAS NOT BEEN MET )   PREFERRED PHARMACY : [facility]

## 2017-05-29 NOTE — Progress Notes (Signed)
Initial Nutrition Assessment  DOCUMENTATION CODES:      INTERVENTION:  High protein snack BID between meals  Multivitamin daily  Provided diet education foods to promote healing   NUTRITION DIAGNOSIS:   (P) Increased nutrient needs related to (P) wound healing as evidenced by (P) estimated needs.   GOAL:   (P) Patient will meet greater than or equal to 90% of their needs   MONITOR:   (P) PO intake, Supplement acceptance, Labs, Weight trends, Skin  REASON FOR ASSESSMENT:   Consult Wound healing  ASSESSMENT:  Patient is a 76 yo who has dry gangrene to right foot. She was discharged home with husband earlier this week from [place] after a 2 month stay.   She reports appetite as good and says intake is >50% of meals. She affirms understanding of need for optimizing nutrition for wound healing.  Her weight reported at initial hospitalization in early August was 154 lb and compared to current wt is down 10% or 6.8 kg.   NUTRITION - FOCUSED PHYSICAL EXAM: She has mild temporal muscle loss and she has not been ambulating since surgery in August. Moderate lower extremity edema.   Diet Order:  Diet regular Room service appropriate? Yes; Fluid consistency: Thin  EDUCATION NEEDS: Provided education about good protein sources and foods with vitmains C, A and Zinc.   Meds: vancomycin, statin, IV zosyn and pacerone.  Labs reviewed. Bun 40, Cr 2.07, Albumin 2.2. WBC's 21.6 and lactic acid 2.0 . TSH-11.08.   Skin:   see WOC note for details. 2 wounds upper calf, Stage 2 PI right heel and right toes necrotic   Last BM:  unknown  Height:   Ht Readings from Last 1 Encounters:  05/28/17 6' (1.829 m)    Weight:   Wt Readings from Last 1 Encounters:  05/29/17 138 lb 10.7 oz (62.9 kg)    Ideal Body Weight:     BMI:  Body mass index is 18.81 kg/m.  Estimated Nutritional Needs:   Kcal:   1820-2080  Protein:   80-90 gr  Fluid:   >1.8 liters daily    Colman Cater  MS,RD,CSG,LDN Office: [phone] Pager: [phone]

## 2017-05-29 NOTE — Progress Notes (Signed)
Paged MD of completion of bolus and post vitals per order. Also MD aware of critical value of Lactic acid of 2.0 from 2.5  Ericka Pontiff, RN 12:34 AM 05/29/17

## 2017-05-29 NOTE — Plan of Care (Signed)
Problem: Safety: Goal: Ability to remain free from injury will improve Outcome: Progressing Side rails up, bed in lowest position, call bell and personal items within reach

## 2017-05-29 NOTE — Progress Notes (Signed)
ANTICOAGULATION CONSULT NOTE -  Pharmacy Consult for Lovenox and Coumadin Indication: atrial fibrillation  No Known Allergies  Patient Measurements: Height: 6' (182.9 cm) Weight: 138 lb 10.7 oz (62.9 kg) IBW/kg (Calculated) : 73.1 HEPARIN DW (KG): 62.9  Vital Signs: Temp: 97.4 F (36.3 C) (10/26 0800) Temp Source: Oral (10/26 0800) BP: 97/67 (10/26 1500) Pulse Rate: 60 (10/26 1400)  Labs:  Recent Labs  05/28/17 1616 05/28/17 1737 05/29/17 0408 05/29/17 1208  HGB 8.8*  --  8.4*  --   HCT 30.5*  --  29.1*  --   PLT 512*  --  532*  --   APTT  --   --  98* 113*  LABPROT  --  20.2*  --   --   INR  --  1.74  --   --   HEPARINUNFRC  --   --  >2.20* 2.20*  CREATININE 2.48*  --  2.07*  --     Estimated Creatinine Clearance: 23.7 mL/min (A) (by C-G formula based on SCr of 2.07 mg/dL (H)).   Medications:  Prescriptions Prior to Admission  Medication Sig Dispense Refill Last Dose  . acetaminophen (TYLENOL) 325 MG tablet Take 2 tablets (650 mg total) by mouth every 6 (six) hours as needed for mild pain or fever.   unknown  . amiodarone (PACERONE) 200 MG tablet Take 1 tablet (200 mg total) by mouth 2 (two) times daily. (Patient taking differently: Take 200 mg by mouth daily. )   Past Week at Unknown time  . diltiazem (CARDIZEM CD) 240 MG 24 hr capsule Take 240 mg by mouth daily.   Past Week at Unknown time  . furosemide (LASIX) 40 MG tablet Take 40 mg by mouth daily.   Past Week at Unknown time  . furosemide (LASIX) 80 MG tablet Take 80 mg by mouth daily.   Past Week at Unknown time  . gabapentin (NEURONTIN) 100 MG capsule Take 100 mg by mouth 3 (three) times daily.     Past Week at Unknown time  . hydroxyurea (HYDREA) 500 MG capsule Take 2 capsules (1,000 mg total) by mouth daily. May take with food to minimize GI side effects. 60 capsule 2 Past Week at Unknown time  . metolazone (ZAROXOLYN) 2.5 MG tablet Take 2.5 mg by mouth every Monday, Wednesday, and Friday.   Past Week at  Unknown time  . metoprolol tartrate (LOPRESSOR) 100 MG tablet Take 100 mg by mouth 2 (two) times daily.   05/28/2017 at [facility]  . pravastatin (PRAVACHOL) 20 MG tablet Take 10 mg by mouth daily.   Past Week at Unknown time  . torsemide (DEMADEX) 20 MG tablet Take 40 mg by mouth daily.   Past Week at Unknown time  . vancomycin 500 mg in sodium chloride 0.9 % 100 mL Inject 500 mg into the vein daily.     Marland Kitchen warfarin (COUMADIN) 5 MG tablet Take 2.5-5 mg by mouth See admin instructions. Take one tablet all days except take 2.5mg  on MWF   Past Week at Unknown time    Assessment: 76 yo female just discharged from [facility] 05/27/17 after being in [facility] from 8/1-26 for septic shock with limb ischemia and cellulitis and was discharged to [facility]).  She was on Eliquis at [facility] and discharged home with this but is unable to afford. She has been on Coumadin previously. In addition, she was supposed to be taking Vancomycin infusions for RLE cellulits but unable to afford.  Pharmacy asked to  start Coumadin, patient unable to afford DOACs. D/W MD and will change heparin --> to lovenox and bridge the Coumadin. Stop heparin and start lovenox 1 hour afterwards.  Goal of Therapy:  INR 2-3 Monitor platelets by anticoagulation protocol: Yes   Plan:  Lovenox 1mg /kg/day 60mg  SQ q24h (CrCl 30mls/min) Coumadin 5mg  po x 1 today Daily PT-INR Continue to monitor H&H and platelets  Isac Sarna, BS [place], BCPS Clinical Pharmacist Pager [phone] 05/29/2017,4:02 PM

## 2017-05-29 NOTE — Progress Notes (Signed)
PROGRESS NOTE    Charlotte [name]  GUY:[id number]  DOB: 1941/03/08  DOA: 05/28/2017 PCP: [name], MD   Brief Admission Hx: Charlotte [name] is a 76 y.o. 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  MDM/Assessment & Plan:   1. Hypotension - resolved with IVF hydration, resumed home mitodrine. Follow.  I believe a lot of the problem is home medication mismanagement as patient is on a very confusing regimen of diuretics and blood pressure medications that do not make a lot of sense.  We will need to simplify the regimen.  Holding diuretics right now. 2. Presumed Sepsis - likely secondary to cellulitis RLE and dry gangrene - continue IV antibiotics, supportive therapy, follow lactate as it trends down.  Clinically patient is much improved.  3. Cellulitis RLE - apparently this was diagnosed during month long stay at [facility] (just discharged 2 days ago), will try to obtain discharge summary from [facility].  4. Polycythemia - WBC is markedly less than previous tests where is had been as high as 45.  5. PVD with dry gangrene RLE - Pt was recently seen by vascular surgeon and is still at high risk for needing right AKA.  Pt's husband did not want her transferred to [facility] so that she could be seen by vascular surgery but may not have a choice if she decompensates.  6. Acute kidney injury-likely prerenal although she has been on IV vancomycin which can also precipitate this.  Some improvement noted with hydration, continue IV fluid hydration and recheck renal function tomorrow. 7. Atrial fibrillation-unfortunately patient had not been on anticoagulation as she was supposed to.  The family is reporting  they are not able to afford Eliquis-she apparently was changed to this while at [facility].  She is currently on a heparin infusion.  Case management has been consulted to assist with medication so that she can be restarted on Eliquis or Xarelto or Pradaxa.  She will be maintained on amiodarone and Cardizem for rate control. 8. Hypothyroidism-TSH is trending down from 17 in August 2018- now down to 11.  Continue thyroid supplementation.  DVT prophylaxis: heparin Code Status: DNR Family Communication: husband Disposition Plan: TBD, I think she needs SNF placement  Subjective: Pt is hard of hearing but denies complaints.    Objective: Vitals:   05/29/17 0300 05/29/17 0400 05/29/17 0500 05/29/17 0600  BP: 92/60 102/68 108/65 (!) 105/52  Pulse: 64 66 64 62  Resp: 19 16 15  (!) 22  Temp:  (!) 97.5 F (36.4 C)    TempSrc:  Oral    SpO2: 95% 96% (!) 87% 96%  Weight:   62.9 kg (138 lb 10.7 oz)   Height:        Intake/Output Summary (Last 24 hours) at 05/29/17 0743 Last data filed at 05/28/17 1907  Gross per 24 hour  Intake             1750 ml  Output                0 ml  Net             1750 ml   Filed Weights   05/28/17 1528 05/28/17 2212 05/29/17 0500  Weight: 60.3 kg (133 lb) 62.9 kg (  138 lb 10.7 oz) 62.9 kg (138 lb 10.7 oz)     REVIEW OF SYSTEMS  As per history otherwise all reviewed and reported negative  Exam:  General exam: hard of hearing. Awake, alert, NAD.  Respiratory system: Clear. No increased work of breathing. Cardiovascular system: S1 & S2 heard, RRR. No JVD, murmurs, gallops, clicks or pedal edema. Gastrointestinal system: Abdomen is nondistended, soft and nontender. Normal bowel sounds heard. Central nervous system: Alert and oriented. No focal neurological deficits. Extremities: dry gangrene RLE, slow healing wounds BLE.   Data Reviewed: Basic Metabolic Panel:  Recent Labs Lab 05/28/17 1616 05/29/17 0408  NA 136 135  K 3.4* 3.9  CL 104 105  CO2 20*  19*  GLUCOSE 82 69  BUN 46* 40*  CREATININE 2.48* 2.07*  CALCIUM 7.8* 7.4*   Liver Function Tests:  Recent Labs Lab 05/28/17 1616 05/29/17 0408  AST 14* 14*  ALT 8* 6*  ALKPHOS 94 95  BILITOT 0.5 0.5  PROT 5.3* 4.8*  ALBUMIN 2.4* 2.2*   No results for input(s): LIPASE, AMYLASE in the last 168 hours. No results for input(s): AMMONIA in the last 168 hours. CBC:  Recent Labs Lab 05/28/17 1616 05/29/17 0408  WBC 21.5* 21.6*  NEUTROABS 18.9*  --   HGB 8.8* 8.4*  HCT 30.5* 29.1*  MCV 83.1 82.9  PLT 512* 532*   Cardiac Enzymes: No results for input(s): CKTOTAL, CKMB, CKMBINDEX, TROPONINI in the last 168 hours. CBG (last 3)  No results for input(s): GLUCAP in the last 72 hours. Recent Results (from the past 240 hour(s))  Blood Culture (routine x 2)     Status: None (Preliminary result)   Collection Time: 05/28/17  4:16 PM  Result Value Ref Range Status   Specimen Description PORTA CATH DRAWN BY RN  Final   Special Requests   Final    BOTTLES DRAWN AEROBIC AND ANAEROBIC Blood Culture results may not be optimal due to an inadequate volume of blood received in culture bottles   Culture PENDING  Incomplete   Report Status PENDING  Incomplete  Blood Culture (routine x 2)     Status: None (Preliminary result)   Collection Time: 05/28/17  4:16 PM  Result Value Ref Range Status   Specimen Description LEFT ANTECUBITAL  Final   Special Requests   Final    BOTTLES DRAWN AEROBIC AND ANAEROBIC Blood Culture adequate volume   Culture PENDING  Incomplete   Report Status PENDING  Incomplete  MRSA PCR Screening     Status: None   Collection Time: 05/28/17  9:53 PM  Result Value Ref Range Status   MRSA by PCR NEGATIVE NEGATIVE Final    Comment:        The GeneXpert MRSA Assay (FDA approved for NASAL specimens only), is one component of a comprehensive MRSA colonization surveillance program. It is not intended to diagnose MRSA infection nor to guide or monitor treatment  for MRSA infections.      Studies: Dg Chest Portable 1 View  Result Date: 05/28/2017 CLINICAL DATA:  Central catheter placement.  Hypotension. EXAM: PORTABLE CHEST 1 VIEW COMPARISON:  March 28, 2017 FINDINGS: Central catheter tip is in the superior vena cava. No pneumothorax. There is patchy airspace opacity in the medial left lung base. There are minimal pleural effusions bilaterally. Lungs elsewhere are clear. There is borderline cardiomegaly with mild pulmonary venous hypertension. No adenopathy. No bone lesions. IMPRESSION: Central catheter tip in superior vena cava. No pneumothorax. Pulmonary vascular congestion  with small pleural effusions bilaterally. Medial airspace consolidation in the left base is likely due to superimposed pneumonia. Followup PA and lateral chest radiographs recommended in 3-4 weeks following trial of antibiotic therapy to ensure resolution and exclude underlying malignancy. Electronically Signed   By: Lowella Grip III M.D.   On: 05/28/2017 15:48   Scheduled Meds: . amiodarone  200 mg Oral Daily  . aspirin EC  81 mg Oral Daily  . diltiazem  240 mg Oral Daily  . gabapentin  100 mg Oral TID  . hydroxyurea  1,000 mg Oral Daily  . levothyroxine  75 mcg Oral QAC breakfast  . midodrine  5 mg Oral TID WC  . pravastatin  10 mg Oral q1800   Continuous Infusions: . sodium chloride    . heparin 900 Units/hr (05/28/17 2316)  . piperacillin-tazobactam (ZOSYN)  IV Stopped (05/29/17 0400)  . vancomycin      Principal Problem:   Sepsis ([place]) Active Problems:   Chronic atrial fibrillation (HCC)   Anticoagulant long-term use   HTN (hypertension)   Hypothyroidism   Peripheral vascular disease (HCC)   Cellulitis   Acute renal failure (ARF) (HCC)   Hypotension   Pressure ulcer   Critical Care Time spent: 35 mins  Irwin Brakeman, MD, FAAFP [facility] Pager [phone]  If 7PM-7AM, please contact night-coverage [email or web address] Password  [email or web address] 05/29/2017, 7:43 AM    LOS: 1 day ]

## 2017-05-29 NOTE — Evaluation (Signed)
Physical Therapy Evaluation Patient Details Name: Charlotte [name] MRN: [id number] DOB: 16-Jun-1941 Today's Date: 05/29/2017   History of Present Illness  Charlotte [name] is a 76 y.o. 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  She was admitted to [facility] from 8/1-26 for septic shock with limb ischemia and cellulitis and was discharged to [facility]).  Patient was discharged from [place] yesterday.  She slept well overnight.  This AM, she took her BP medication - she took 100 mg metoprolol at 0500 (records are conflicting but it appears that the plan was to decrease this medication to 25 mg daily and she also has been on midodrine at [facility] for hypotension).  She was feeling good, but the home health nurse came and checked her BP and it was 60/40.  She is "supposed to be taking the kind to bring it up instead of the kind to lower it."  The home health nurse recommended that she come in for evaluation.  No SOB.  She had been on home O2 prior but they took her off it at [place].  No cough or wheezing.  She had dysuria at [facility], diagnosed with a yeast infection.  She has not complained of further urinary symptoms.  She has been taking Lasix for fluid and she took it yesterday.  ?foot swelling on the right only, which they thought was due to her bandage being too tight.  She is also supposed to be receiving Vanc infusions for RLE cellulitis but they were unable to afford the Vanc and so she did not receive her home dose today.  Finally, they were previously on Coumadin and appear to have been changed to Eliquis, but they are unable to afford this medication.     Clinical Impression  Charlotte [name] is  a 76 y.o. female presenting with generalized deconditioning and weakness LE>UE, decreased activity tolerance, and impaired sensation and skin integrity. Mrs. Piazza is highly motivated and participatory with therapy to gain strength back and regain functional independence prior to her ongoing hospital stays. She currently requires Maximum assistance for sit to stand transfers and moderate assistance for bed mobility. Mrs. Heyde will benefit from ongoing skilled PT to address impairments and improve safety with mobility for discharge. Acute PT will follow throughout length of stay to improve BLE strength and activity tolerance.     Follow Up Recommendations SNF    Equipment Recommendations  None recommended by PT (patient has all necessary DME)    Recommendations for Other Services       Precautions / Restrictions Precautions Precautions: Fall Restrictions Weight Bearing Restrictions: Yes RLE Weight Bearing: Weight bearing as tolerated      Mobility  Bed Mobility Overal bed mobility: Needs Assistance Bed Mobility: Rolling;Sidelying to Sit;Supine to Sit;Sit to Supine;Sit to Sidelying Rolling: Modified independent (Device/Increase time) (in flat bed with use of bed rail) Sidelying to sit: Min assist (cues for sequencing and assist with line management) Supine to sit: Min assist (cues for sequencing and assist with line management) Sit to supine: Mod assist (BLE management and line management) Sit to sidelying: Mod assist (BLE management and line management) General bed mobility comments: patient modified independet with use of bed rails and extra time for rolling and scooting in  bed  Transfers Overall transfer level: Needs assistance Equipment used: Rolling walker (2 wheeled) Transfers: Sit to/from Stand;Lateral/Scoot Transfers Sit to Stand: Max assist   Lateral/Scoot Transfers: Mod assist General transfer comment: verbal and tactile cues to sequence B LE/UE position and forward trunk  lean for buttock clearance during transfers, gait belt and front wheeled walker for sit<>stand transfer with elevated bed  Ambulation/Gait  General Gait Details: unsafe at this time secondary to generalized weakness      Balance Overall balance assessment: History of Falls;Needs assistance Sitting-balance support: Bilateral upper extremity supported;Feet supported Sitting balance-Leahy Scale: Good Sitting balance - Comments: patient demonstrated good postural strategies during static and dynamic seated activities at edge of bed (donning sock on LLE) Postural control: Posterior lean Standing balance support: Bilateral upper extremity supported Standing balance-Leahy Scale: Good Standing balance comment: patient with good postural control during static standing with bilateral UE support on front wheeled walker, patient tolerated 2 buots ~1 min of standing upright activity, unsafe to test dynamic standing balance at this time       Pertinent Vitals/Pain Pain Assessment: Faces Pain Score: 0-No pain    Home Living Family/patient expects to be discharged to:: Private residence Living Arrangements: Spouse/significant other;Children (husband and adult son) Available Help at Discharge: Family;Available 24 hours/day Type of Home: Mobile home Home Access: Ramped entrance   Home Layout: One level Home Equipment: [place] - 2 wheels;Bedside commode;Wheelchair - manual      Prior Function Level of Independence: Needs assistance   Gait / Transfers Assistance Needed: uses wheelchair as primary mobility device, reports she has not walked or stood up since her surgery in August  ADL's / Homemaking Assistance Needed: son and husband perform  Comments: take spnge baths because she fell in teh shower and broke her hip ~ 5 years ago     Hand Dominance   Dominant Hand: Right    Extremity/Trunk Assessment   Upper Extremity Assessment Upper Extremity Assessment: Overall WFL for tasks assessed  (patient able to perform shoulder depression for pressure relief while sitting at edge of bed )    Lower Extremity Assessment Lower Extremity Assessment: Generalized weakness;RLE deficits/detail;LLE deficits/detail RLE Deficits / Details: grossly 4-/5 for knee flexion/extension and hip flexion RLE Sensation: history of peripheral neuropathy;decreased light touch LLE Deficits / Details: grossly 3+/5 for knee flexion/extension and hip flexion LLE Sensation: history of peripheral neuropathy;decreased light touch    Cervical / Trunk Assessment Cervical / Trunk Assessment: Normal  Communication   Communication: HOH  Cognition Arousal/Alertness: Awake/alert Behavior During Therapy: WFL for tasks assessed/performed Overall Cognitive Status: Within Functional Limits for tasks assessed    General Comments: patient is oriented x 3, distracted intermitently throughout session but easily redirected to task      General Comments General comments (skin integrity, edema, etc.): RLE with wound dressing on forefoot and foam dressing on heel and lower leg, LLE with foam dressing on heel,  BLE with prevalon boots for positioning and pressure relief    Exercises Other Exercises Other Exercises: shoulder depression at edge of bed for functional presure relief: 2 sets x 5 reps with extended rest break between Other Exercises: mini-squat initiation for standing from edge of bed for BLE strengthening, 1 set x 5 reps Other Exercises: repeated sit<>stand with front wheeled walker for BLE strengthening, 2 reps with extended rest break between   Assessment/Plan    PT Assessment Patient needs continued PT services  PT Problem List Decreased strength;Decreased activity tolerance;Decreased balance;Decreased mobility;Decreased coordination;Impaired sensation;Decreased  safety awareness;Decreased knowledge of precautions;Decreased skin integrity;Decreased knowledge of use of DME       PT Treatment Interventions  DME instruction;Functional mobility training;Therapeutic activities;Therapeutic exercise;Balance training;Wheelchair mobility training;Patient/family education;Neuromuscular re-education    PT Goals (Current goals can be found in the Care Plan section)  Acute Rehab PT Goals Patient Stated Goal: want to get stronger PT Goal Formulation: With patient Time For Goal Achievement: 06/01/17 Potential to Achieve Goals: Fair    Frequency Min 3X/week   Barriers to discharge           AM-PAC PT "6 Clicks" Daily Activity  Outcome Measure Difficulty turning over in bed (including adjusting bedclothes, sheets and blankets)?: Unable Difficulty moving from lying on back to sitting on the side of the bed? : Unable Difficulty sitting down on and standing up from a chair with arms (e.g., wheelchair, bedside commode, etc,.)?: Unable Help needed moving to and from a bed to chair (including a wheelchair)?: Total Help needed walking in hospital room?: Total Help needed climbing 3-5 steps with a railing? : Total 6 Click Score: 6    End of Session Equipment Utilized During Treatment: Gait belt;Oxygen (front wheeled walker) Activity Tolerance: Patient tolerated treatment well Patient left: in bed;with call bell/phone within reach;with nursing/sitter in room Nurse Communication: Mobility status (notified of patient location/position, recommended Denna Haggard for bed to chair transfers) PT Visit Diagnosis: Muscle weakness (generalized) (M62.81);Difficulty in walking, not elsewhere classified (R26.2);Unsteadiness on feet (R26.81);Repeated falls (R29.6)    Time: 1275-1700 PT Time Calculation (min) (ACUTE ONLY): 47 min   Charges:   PT Evaluation $PT Eval High Complexity: 1 High PT Treatments $Therapeutic Exercise: 8-22 mins $Therapeutic Activity: 23-37 mins   PT G Codes:   PT G-Codes **NOT FOR INPATIENT CLASS** Functional Assessment Tool Used: AM-PAC 6 Clicks Basic Mobility Functional Limitation:  Mobility: Walking and moving around;Changing and maintaining body position Mobility: Walking and Moving Around Current Status [phone]): 100 percent impaired, limited or restricted Mobility: Walking and Moving Around Goal Status [phone]): 100 percent impaired, limited or restricted Mobility: Walking and Moving Around Discharge Status [phone]): 100 percent impaired, limited or restricted Changing and Maintaining Body Position Current Status (G6659): 100 percent impaired, limited or restricted Changing and Maintaining Body Position Goal Status (D3570): 100 percent impaired, limited or restricted Changing and Maintaining Body Position Discharge Status (V7793): 100 percent impaired, limited or restricted     Debara Pickett, PT, DPT Physical Therapist with [facility]  05/29/2017 3:59 PM

## 2017-05-30 DIAGNOSIS — E038 Other specified hypothyroidism: Secondary | ICD-10-CM

## 2017-05-30 LAB — COMPREHENSIVE METABOLIC PANEL
ALBUMIN: 2.1 g/dL — AB (ref 3.5–5.0)
ALT: 10 U/L — ABNORMAL LOW (ref 14–54)
ANION GAP: 10 (ref 5–15)
AST: 14 U/L — AB (ref 15–41)
Alkaline Phosphatase: 75 U/L (ref 38–126)
BILIRUBIN TOTAL: 0.8 mg/dL (ref 0.3–1.2)
BUN: 36 mg/dL — ABNORMAL HIGH (ref 6–20)
CHLORIDE: 114 mmol/L — AB (ref 101–111)
CO2: 18 mmol/L — ABNORMAL LOW (ref 22–32)
Calcium: 7.8 mg/dL — ABNORMAL LOW (ref 8.9–10.3)
Creatinine, Ser: 1.71 mg/dL — ABNORMAL HIGH (ref 0.44–1.00)
GFR calc Af Amer: 33 mL/min — ABNORMAL LOW (ref >60)
GFR calc non Af Amer: 28 mL/min — ABNORMAL LOW (ref >60)
GLUCOSE: 77 mg/dL (ref 65–99)
POTASSIUM: 3.7 mmol/L (ref 3.5–5.1)
SODIUM: 142 mmol/L (ref 135–145)
TOTAL PROTEIN: 4.8 g/dL — AB (ref 6.5–8.1)

## 2017-05-30 LAB — CBC
HEMATOCRIT: 28 % — AB (ref 36.0–46.0)
Hemoglobin: 8.1 g/dL — ABNORMAL LOW (ref 12.0–15.0)
MCH: 24 pg — ABNORMAL LOW (ref 26.0–34.0)
MCHC: 28.9 g/dL — AB (ref 30.0–36.0)
MCV: 83.1 fL (ref 78.0–100.0)
PLATELETS: 549 10*3/uL — AB (ref 150–400)
RBC: 3.37 MIL/uL — ABNORMAL LOW (ref 3.87–5.11)
RDW: 22.3 % — AB (ref 11.5–15.5)
WBC: 20.8 10*3/uL — AB (ref 4.0–10.5)

## 2017-05-30 LAB — PROTIME-INR
INR: 1.71
Prothrombin Time: 19.9 seconds — ABNORMAL HIGH (ref 11.4–15.2)

## 2017-05-30 LAB — HIV ANTIBODY (ROUTINE TESTING W REFLEX): HIV SCREEN 4TH GENERATION: NONREACTIVE

## 2017-05-30 LAB — CALCIUM, IONIZED: Calcium, Ionized, Serum: 4.3 mg/dL — ABNORMAL LOW (ref 4.5–5.6)

## 2017-05-30 MED ORDER — WARFARIN SODIUM 5 MG PO TABS
5.0000 mg | ORAL_TABLET | Freq: Once | ORAL | Status: AC
  Administered 2017-05-30: 5 mg via ORAL
  Filled 2017-05-30: qty 1

## 2017-05-30 NOTE — Progress Notes (Signed)
PROGRESS NOTE    [name]  NWG:[id number]  DOB: 1940-10-20  DOA: 05/28/2017 PCP: [name], MD   Brief Admission Hx: Charlotte [name] is a 76 y.o. 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  MDM/Assessment & Plan:   1. Hypotension - resolved with IVF hydration, resumed home mitodrine. Follow.  I believe a lot of the problem is home medication mismanagement as patient is on a very confusing regimen of diuretics and blood pressure medications that do not make a lot of sense.  We will simplify the regimen.  Holding diuretics right now. 2. Presumed Sepsis - resolved now.  likely secondary to cellulitis RLE and dry gangrene - continue IV antibiotics, supportive therapy, follow lactate as it trends down.  Clinically patient is much improved. Transfer to Med Surg 3. Cellulitis RLE - apparently this was diagnosed during month long stay at [facility] (just discharged 2 days ago), will try to obtain discharge summary from [place].  Continue IV antibiotics.  4. Polycythemia - WBC is markedly less than previous tests where is had been as high as 45.  Continue hydrea.  5. PVD with dry gangrene RLE - Pt was recently seen by vascular surgeon and is still at high risk for needing right AKA.  Pt's husband did not want her transferred to [facility] so that she could be seen by vascular surgery but may not have a choice if she decompensates.  6. Acute kidney injury-likely prerenal although she has been on IV vancomycin which can also precipitate this.  Some improvement noted with hydration, continue IV fluid hydration and recheck renal function tomorrow.  It is improving with hydration.  7. Atrial  fibrillation-unfortunately patient had not been on anticoagulation as she was supposed to.  The family is reporting they are not able to afford Eliquis-she apparently was changed to this while at [place].  She is currently on a heparin infusion.  Pt restarted on warfarin/lovenox.  She will be maintained on amiodarone and Cardizem for rate control. 8. Hypothyroidism-TSH is trending down from 17 in August 2018- now down to 11.  Continue thyroid supplementation.  Recheck TSH in a few months.   DVT prophylaxis: heparin Code Status: DNR Family Communication: husband Disposition Plan: TBD, I think she needs SNF placement, I spoke with husband who agrees, otherwise home with HH   Subjective: Pt is eating biscuits at bedside, husband present, no complaints.   Objective: Vitals:   05/30/17 0300 05/30/17 0400 05/30/17 0500 05/30/17 0600  BP: (!) 98/55 115/73 102/64 116/64  Pulse: (!) 57 69 67 68  Resp: 15 16 (!) 25 18  Temp:  98 F (36.7 C)    TempSrc:  Oral    SpO2: 92% (!) 87% (!) 85% 92%  Weight:      Height:        Intake/Output Summary (Last 24 hours) at 05/30/17 0652 Last data filed at 05/30/17 0648  Gross per 24 hour  Intake          4809.58 ml  Output                0 ml  Net          4809.58 ml   Filed Weights   05/28/17 1528 05/28/17  2212 05/29/17 0500  Weight: 60.3 kg (133 lb) 62.9 kg (138 lb 10.7 oz) 62.9 kg (138 lb 10.7 oz)     REVIEW OF SYSTEMS  As per history otherwise all reviewed and reported negative  Exam:  General exam: hard of hearing. Awake, alert, NAD.  Respiratory system: Clear. No increased work of breathing. Cardiovascular system: S1 & S2 heard, RRR. No JVD, murmurs, gallops, clicks or pedal edema. Gastrointestinal system: Abdomen is nondistended, soft and nontender. Normal bowel sounds heard. Central nervous system: Alert and oriented. No focal neurological deficits. Extremities: dry gangrene RLE, slow healing wounds BLE.   Data Reviewed: Basic  Metabolic Panel:  Recent Labs Lab 05/28/17 1616 05/29/17 0408 05/30/17 0441  NA 136 135 142  K 3.4* 3.9 3.7  CL 104 105 114*  CO2 20* 19* 18*  GLUCOSE 82 69 77  BUN 46* 40* 36*  CREATININE 2.48* 2.07* 1.71*  CALCIUM 7.8* 7.4* 7.8*   Liver Function Tests:  Recent Labs Lab 05/28/17 1616 05/29/17 0408 05/30/17 0441  AST 14* 14* 14*  ALT 8* 6* 10*  ALKPHOS 94 95 75  BILITOT 0.5 0.5 0.8  PROT 5.3* 4.8* 4.8*  ALBUMIN 2.4* 2.2* 2.1*   No results for input(s): LIPASE, AMYLASE in the last 168 hours. No results for input(s): AMMONIA in the last 168 hours. CBC:  Recent Labs Lab 05/28/17 1616 05/29/17 0408 05/30/17 0441  WBC 21.5* 21.6* 20.8*  NEUTROABS 18.9*  --   --   HGB 8.8* 8.4* 8.1*  HCT 30.5* 29.1* 28.0*  MCV 83.1 82.9 83.1  PLT 512* 532* 549*   Cardiac Enzymes: No results for input(s): CKTOTAL, CKMB, CKMBINDEX, TROPONINI in the last 168 hours. CBG (last 3)  No results for input(s): GLUCAP in the last 72 hours. Recent Results (from the past 240 hour(s))  Blood Culture (routine x 2)     Status: None (Preliminary result)   Collection Time: 05/28/17  4:16 PM  Result Value Ref Range Status   Specimen Description PORTA CATH DRAWN BY RN  Final   Special Requests   Final    BOTTLES DRAWN AEROBIC AND ANAEROBIC Blood Culture results may not be optimal due to an inadequate volume of blood received in culture bottles   Culture NO GROWTH 2 DAYS  Final   Report Status PENDING  Incomplete  Blood Culture (routine x 2)     Status: None (Preliminary result)   Collection Time: 05/28/17  4:16 PM  Result Value Ref Range Status   Specimen Description LEFT ANTECUBITAL  Final   Special Requests   Final    BOTTLES DRAWN AEROBIC AND ANAEROBIC Blood Culture adequate volume   Culture NO GROWTH 2 DAYS  Final   Report Status PENDING  Incomplete  MRSA PCR Screening     Status: None   Collection Time: 05/28/17  9:53 PM  Result Value Ref Range Status   MRSA by PCR NEGATIVE NEGATIVE  Final    Comment:        The GeneXpert MRSA Assay (FDA approved for NASAL specimens only), is one component of a comprehensive MRSA colonization surveillance program. It is not intended to diagnose MRSA infection nor to guide or monitor treatment for MRSA infections.      Studies: Dg Chest Portable 1 View  Result Date: 05/28/2017 CLINICAL DATA:  Central catheter placement.  Hypotension. EXAM: PORTABLE CHEST 1 VIEW COMPARISON:  March 28, 2017 FINDINGS: Central catheter tip is in the superior vena cava. No pneumothorax. There is patchy  airspace opacity in the medial left lung base. There are minimal pleural effusions bilaterally. Lungs elsewhere are clear. There is borderline cardiomegaly with mild pulmonary venous hypertension. No adenopathy. No bone lesions. IMPRESSION: Central catheter tip in superior vena cava. No pneumothorax. Pulmonary vascular congestion with small pleural effusions bilaterally. Medial airspace consolidation in the left base is likely due to superimposed pneumonia. Followup PA and lateral chest radiographs recommended in 3-4 weeks following trial of antibiotic therapy to ensure resolution and exclude underlying malignancy. Electronically Signed   By: Lowella Grip III M.D.   On: 05/28/2017 15:48   Scheduled Meds: . amiodarone  200 mg Oral Daily  . aspirin EC  81 mg Oral Daily  . Chlorhexidine Gluconate Cloth  6 each Topical Daily  . collagenase   Topical Daily  . diltiazem  240 mg Oral Daily  . enoxaparin (LOVENOX) injection  60 mg Subcutaneous Q24H  . feeding supplement (ENSURE ENLIVE)  237 mL Oral BID BM  . gabapentin  100 mg Oral TID  . hydroxyurea  1,000 mg Oral Daily  . levothyroxine  75 mcg Oral QAC breakfast  . midodrine  5 mg Oral TID WC  . multivitamin with minerals  1 tablet Oral Daily  . pravastatin  10 mg Oral q1800  . sodium chloride flush  10-40 mL Intracatheter Q12H  . Warfarin - Pharmacist Dosing Inpatient   Does not apply q1800    Continuous Infusions: . sodium chloride 150 mL/hr at 05/29/17 1520  . piperacillin-tazobactam (ZOSYN)  IV 3.375 g (05/30/17 0120)  . vancomycin Stopped (05/29/17 1620)    Principal Problem:   Sepsis ([place]) Active Problems:   Chronic atrial fibrillation (HCC)   Anticoagulant long-term use   HTN (hypertension)   Hypothyroidism   Peripheral vascular disease (HCC)   Cellulitis   Acute renal failure (ARF) (HCC)   Hypotension   Pressure ulcer   Critical Care Time spent: 31 mins  Irwin Brakeman, MD, FAAFP [facility] Pager [phone]  If 7PM-7AM, please contact night-coverage [email or web address] Password [email or web address] 05/30/2017, 6:52 AM    LOS: 2 days ]

## 2017-05-30 NOTE — Progress Notes (Signed)
ANTICOAGULATION CONSULT NOTE -  Pharmacy Consult for Lovenox and Coumadin Indication: atrial fibrillation  No Known Allergies  Patient Measurements: Height: 6' (182.9 cm) Weight: 138 lb 10.7 oz (62.9 kg) IBW/kg (Calculated) : 73.1 HEPARIN DW (KG): 62.9  Vital Signs: Temp: 98 F (36.7 C) (10/27 0400) Temp Source: Oral (10/27 0400) BP: 116/64 (10/27 0600) Pulse Rate: 68 (10/27 0600)  Labs:  Recent Labs  05/28/17 1616 05/28/17 1737 05/29/17 0408 05/29/17 1208 05/29/17 2043 05/30/17 0441  HGB 8.8*  --  8.4*  --   --  8.1*  HCT 30.5*  --  29.1*  --   --  28.0*  PLT 512*  --  532*  --   --  549*  APTT  --   --  98* 113* 72*  --   LABPROT  --  20.2*  --   --   --  19.9*  INR  --  1.74  --   --   --  1.71  HEPARINUNFRC  --   --  >2.20* 2.20*  --   --   CREATININE 2.48*  --  2.07*  --   --  1.71*    Estimated Creatinine Clearance: 28.7 mL/min (A) (by C-G formula based on SCr of 1.71 mg/dL (H)).   Medications:  Prescriptions Prior to Admission  Medication Sig Dispense Refill Last Dose  . acetaminophen (TYLENOL) 325 MG tablet Take 2 tablets (650 mg total) by mouth every 6 (six) hours as needed for mild pain or fever.   unknown  . amiodarone (PACERONE) 200 MG tablet Take 1 tablet (200 mg total) by mouth 2 (two) times daily. (Patient taking differently: Take 200 mg by mouth daily. )   Past Week at Unknown time  . diltiazem (CARDIZEM CD) 240 MG 24 hr capsule Take 240 mg by mouth daily.   Past Week at Unknown time  . furosemide (LASIX) 40 MG tablet Take 40 mg by mouth daily.   Past Week at Unknown time  . furosemide (LASIX) 80 MG tablet Take 80 mg by mouth daily.   Past Week at Unknown time  . gabapentin (NEURONTIN) 100 MG capsule Take 100 mg by mouth 3 (three) times daily.     Past Week at Unknown time  . hydroxyurea (HYDREA) 500 MG capsule Take 2 capsules (1,000 mg total) by mouth daily. May take with food to minimize GI side effects. 60 capsule 2 Past Week at Unknown time  .  metolazone (ZAROXOLYN) 2.5 MG tablet Take 2.5 mg by mouth every Monday, Wednesday, and Friday.   Past Week at Unknown time  . metoprolol tartrate (LOPRESSOR) 100 MG tablet Take 100 mg by mouth 2 (two) times daily.   05/28/2017 at [place]  . pravastatin (PRAVACHOL) 20 MG tablet Take 10 mg by mouth daily.   Past Week at Unknown time  . torsemide (DEMADEX) 20 MG tablet Take 40 mg by mouth daily.   Past Week at Unknown time  . vancomycin 500 mg in sodium chloride 0.9 % 100 mL Inject 500 mg into the vein daily.     Marland Kitchen warfarin (COUMADIN) 5 MG tablet Take 2.5-5 mg by mouth See admin instructions. Take one tablet all days except take 2.5mg  on MWF   Past Week at Unknown time    Assessment: 76 yo female just discharged from [place] 05/27/17 after being in [facility] from 8/1-26 for septic shock with limb ischemia and cellulitis and was discharged to [facility]).  She was on Eliquis at  [facility] and discharged home with this but is unable to afford. She has been on Coumadin previously. In addition, she was supposed to be taking Vancomycin infusions for RLE cellulits but unable to afford.  Pharmacy asked to start Coumadin, patient unable to afford DOACs. Now on lovenox bridge with Coumadin. INR at 1.71. CRCl improved  Goal of Therapy:  INR 2-3 Monitor platelets by anticoagulation protocol: Yes   Plan:  Continue Lovenox 1mg /kg/day 60mg  SQ q24h (CrCl 29mls/min) Coumadin 5mg  po x 1 today Daily PT-INR Continue to monitor H&H and platelets  Isac Sarna, BS Vena [place], BCPS Clinical Pharmacist Pager [phone] 05/30/2017,10:24 AM

## 2017-05-31 LAB — COMPREHENSIVE METABOLIC PANEL
ALBUMIN: 2.2 g/dL — AB (ref 3.5–5.0)
ALT: 8 U/L — ABNORMAL LOW (ref 14–54)
ANION GAP: 8 (ref 5–15)
AST: 14 U/L — ABNORMAL LOW (ref 15–41)
Alkaline Phosphatase: 80 U/L (ref 38–126)
BUN: 29 mg/dL — ABNORMAL HIGH (ref 6–20)
CHLORIDE: 115 mmol/L — AB (ref 101–111)
CO2: 18 mmol/L — ABNORMAL LOW (ref 22–32)
Calcium: 8 mg/dL — ABNORMAL LOW (ref 8.9–10.3)
Creatinine, Ser: 1.48 mg/dL — ABNORMAL HIGH (ref 0.44–1.00)
GFR calc Af Amer: 39 mL/min — ABNORMAL LOW (ref >60)
GFR calc non Af Amer: 34 mL/min — ABNORMAL LOW (ref >60)
GLUCOSE: 81 mg/dL (ref 65–99)
Potassium: 3.4 mmol/L — ABNORMAL LOW (ref 3.5–5.1)
SODIUM: 141 mmol/L (ref 135–145)
TOTAL PROTEIN: 4.8 g/dL — AB (ref 6.5–8.1)
Total Bilirubin: 0.6 mg/dL (ref 0.3–1.2)

## 2017-05-31 LAB — PROTIME-INR
INR: 2.15
Prothrombin Time: 23.8 seconds — ABNORMAL HIGH (ref 11.4–15.2)

## 2017-05-31 LAB — VANCOMYCIN, TROUGH: VANCOMYCIN TR: 21 ug/mL — AB (ref 15–20)

## 2017-05-31 MED ORDER — POTASSIUM CHLORIDE CRYS ER 20 MEQ PO TBCR
40.0000 meq | EXTENDED_RELEASE_TABLET | Freq: Two times a day (BID) | ORAL | Status: DC
  Administered 2017-05-31 – 2017-06-01 (×3): 40 meq via ORAL
  Filled 2017-05-31 (×3): qty 2

## 2017-05-31 MED ORDER — WARFARIN SODIUM 2.5 MG PO TABS
2.5000 mg | ORAL_TABLET | Freq: Once | ORAL | Status: AC
  Administered 2017-05-31: 2.5 mg via ORAL
  Filled 2017-05-31: qty 1

## 2017-05-31 MED ORDER — FUROSEMIDE 40 MG PO TABS
40.0000 mg | ORAL_TABLET | Freq: Every day | ORAL | Status: DC
  Administered 2017-05-31 – 2017-06-01 (×2): 40 mg via ORAL
  Filled 2017-05-31 (×2): qty 1

## 2017-05-31 NOTE — Progress Notes (Signed)
ANTICOAGULATION CONSULT NOTE -  Pharmacy Consult for Lovenox and Coumadin Indication: atrial fibrillation  No Known Allergies  Patient Measurements: Height: 6' (182.9 cm) Weight: 138 lb 10.7 oz (62.9 kg) IBW/kg (Calculated) : 73.1 HEPARIN DW (KG): 62.9  Vital Signs: Temp: 97.4 F (36.3 C) (10/28 0556) Temp Source: Oral (10/28 0556) BP: 119/73 (10/28 0556) Pulse Rate: 72 (10/28 0556)  Labs:  Recent Labs  05/28/17 1616 05/28/17 1737 05/29/17 0408 05/29/17 1208 05/29/17 2043 05/30/17 0441 05/31/17 0525  HGB 8.8*  --  8.4*  --   --  8.1*  --   HCT 30.5*  --  29.1*  --   --  28.0*  --   PLT 512*  --  532*  --   --  549*  --   APTT  --   --  98* 113* 72*  --   --   LABPROT  --  20.2*  --   --   --  19.9* 23.8*  INR  --  1.74  --   --   --  1.71 2.15  HEPARINUNFRC  --   --  >2.20* 2.20*  --   --   --   CREATININE 2.48*  --  2.07*  --   --  1.71* 1.48*    Estimated Creatinine Clearance: 33.1 mL/min (A) (by C-G formula based on SCr of 1.48 mg/dL (H)).   Medications:  Prescriptions Prior to Admission  Medication Sig Dispense Refill Last Dose  . acetaminophen (TYLENOL) 325 MG tablet Take 2 tablets (650 mg total) by mouth every 6 (six) hours as needed for mild pain or fever.   unknown  . amiodarone (PACERONE) 200 MG tablet Take 1 tablet (200 mg total) by mouth 2 (two) times daily. (Patient taking differently: Take 200 mg by mouth daily. )   Past Week at Unknown time  . diltiazem (CARDIZEM CD) 240 MG 24 hr capsule Take 240 mg by mouth daily.   Past Week at Unknown time  . furosemide (LASIX) 40 MG tablet Take 40 mg by mouth daily.   Past Week at Unknown time  . furosemide (LASIX) 80 MG tablet Take 80 mg by mouth daily.   Past Week at Unknown time  . gabapentin (NEURONTIN) 100 MG capsule Take 100 mg by mouth 3 (three) times daily.     Past Week at Unknown time  . hydroxyurea (HYDREA) 500 MG capsule Take 2 capsules (1,000 mg total) by mouth daily. May take with food to minimize  GI side effects. 60 capsule 2 Past Week at Unknown time  . metolazone (ZAROXOLYN) 2.5 MG tablet Take 2.5 mg by mouth every Monday, Wednesday, and Friday.   Past Week at Unknown time  . metoprolol tartrate (LOPRESSOR) 100 MG tablet Take 100 mg by mouth 2 (two) times daily.   05/28/2017 at [place]  . pravastatin (PRAVACHOL) 20 MG tablet Take 10 mg by mouth daily.   Past Week at Unknown time  . torsemide (DEMADEX) 20 MG tablet Take 40 mg by mouth daily.   Past Week at Unknown time  . vancomycin 500 mg in sodium chloride 0.9 % 100 mL Inject 500 mg into the vein daily.     Marland Kitchen warfarin (COUMADIN) 5 MG tablet Take 2.5-5 mg by mouth See admin instructions. Take one tablet all days except take 2.5mg  on MWF   Past Week at Unknown time    Assessment: 76 yo female just discharged from [place] 05/27/17 after being in [facility] from 8/1-26 for  septic shock with limb ischemia and cellulitis and was discharged to [facility]).  She was on Eliquis at [facility] and discharged home with this but is unable to afford. She has been on Coumadin previously. In addition, she was supposed to be taking Vancomycin infusions for RLE cellulits but unable to afford.  Pharmacy asked to start Coumadin, patient unable to afford DOACs. Now on lovenox bridge with Coumadin. INR therapeutic at 2.15.   Goal of Therapy:  INR 2-3 Monitor platelets by anticoagulation protocol: Yes   Plan:  D/c lovenox Coumadin 2.5mg  po x 1 today Daily PT-INR Continue to monitor H&H and platelets  Isac Sarna, BS [place], BCPS Clinical Pharmacist Pager [phone] 05/31/2017,9:37 AM

## 2017-05-31 NOTE — Progress Notes (Signed)
Pharmacy Antibiotic Note  Charlotte [name] is a 76 y.o. female admitted on 05/28/2017 with sepsis and a fib  Pharmacy has been consulted for vancomycin, zosyn. VT reported as 30mcg/ml, just slightly above goal. Renal function continues to improve and expect patient had some accumulation of vancomycin, therefore will continue current dose  Plan: Cont vanc 500 mg IV q24 hours Goal Tr is 15-44mcg/ml Cont zosyn 3.375 gm IV q8 hours F/u renal function, cultures and clinical course F/U Vancomycin levels in 3-7 days  Height: 6' (182.9 cm) Weight: 138 lb 10.7 oz (62.9 kg) IBW/kg (Calculated) : 73.1  Temp (24hrs), Avg:97.5 F (36.4 C), Min:97.4 F (36.3 C), Max:97.5 F (36.4 C)   Recent Labs Lab 05/28/17 1616 05/28/17 1620 05/28/17 1828 05/28/17 2246 05/29/17 0408 05/30/17 0441 05/31/17 0525 05/31/17 1538  WBC 21.5*  --   --   --  21.6* 20.8*  --   --   CREATININE 2.48*  --   --   --  2.07* 1.71* 1.48*  --   LATICACIDVEN  --  2.33* 2.5* 2.0* 2.0*  --   --   --   VANCOTROUGH  --   --   --   --   --   --   --  21*    Estimated Creatinine Clearance: 33.1 mL/min (A) (by C-G formula based on SCr of 1.48 mg/dL (H)).    No Known Allergies   Antibiotics:  Vancomycin 10/25>> Zosyn 10/25 >> Micro: 10/25 Blood cx: ngtd 10/25 MRSPCR is negative  Thank you for allowing pharmacy to be a part of this patient's care.  Isac Sarna, BS [place], [place] Clinical Pharmacist Pager [phone] 05/31/2017 4:58 PM

## 2017-05-31 NOTE — Progress Notes (Signed)
PROGRESS NOTE    Charlotte [name]  TDD:[id number]  DOB: Mar 18, 1941  DOA: 05/28/2017 PCP: [name], MD   Brief Admission Hx: Charlotte [name] is a 76 y.o. 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  MDM/Assessment & Plan:   1. Hypotension - resolved with IVF hydration, resumed home mitodrine. Follow.  DC IVF.  2. Presumed Sepsis - resolved now.  likely secondary to cellulitis RLE and dry gangrene - continue IV antibiotics, supportive therapy, follow lactate as it trends down.  Clinically patient is much improved.  Planning to discharge to SNF tomorrow.  Family wants [facility].  3. Cellulitis RLE - apparently this was diagnosed during months long stay at [facility] (just discharged 2 days PTA), will try to obtain discharge summary from [place].  Continue IV antibiotics.  4. Hypokalemia - oral potassium replacement ordered.  5. Polycythemia - WBC is markedly improved compared to previous tests where is had been as high as 45.  Continue hydrea.  6. PVD with dry gangrene RLE - Pt was recently seen by vascular surgeon and is still at high risk for needing right AKA.  She has follow up planned with Dr. [name] for repeat ABI studies.   7. Acute kidney injury-improving.  This was likely prerenal and improved with IV hydration.  8. Atrial fibrillation-unfortunately patient had not been on anticoagulation as she could not afford eliquis.   The family is reporting they are not able to afford Eliquis-she apparently was changed to this while at [facility].  Pt restarted on warfarin/lovenox.  INR therapeutic today. DC lovenox. Continue warfarin.  She will be maintained on amiodarone and Cardizem for rate  control. 9. Hypothyroidism-TSH is trending down from 17 in August 2018- now down to 11.  Continue thyroid supplementation.  Recheck TSH in a few months.   DVT prophylaxis: heparin Code Status: DNR Family Communication: husband Disposition Plan: SNF 10/29 Family desires [facility].   Subjective: Pt says she feels a lot better today.  She has some edema left arm elbow.   Objective: Vitals:   05/30/17 1045 05/30/17 1400 05/30/17 2245 05/31/17 0556  BP:  (!) 107/57 (!) 112/55 119/73  Pulse: (!) 59 65 65 72  Resp: 18 18 18 17   Temp:  97.8 F (36.6 C) (!) 97.5 F (36.4 C) (!) 97.4 F (36.3 C)  TempSrc:  Oral Oral Oral  SpO2: 92% 94% 90% 92%  Weight:      Height:        Intake/Output Summary (Last 24 hours) at 05/31/17 0913 Last data filed at 05/31/17 0300  Gross per 24 hour  Intake          2047.92 ml  Output                0 ml  Net          2047.92 ml   Filed Weights   05/28/17 1528 05/28/17 2212 05/29/17 0500  Weight: 60.3 kg (133 lb) 62.9 kg (138 lb 10.7 oz) 62.9 kg (138 lb 10.7 oz)   REVIEW OF SYSTEMS  As per history otherwise all reviewed and reported negative  Exam:  General exam: hard of hearing. Awake, alert, NAD.  Respiratory system: Clear. No increased work of breathing. Cardiovascular system: S1 &  S2 heard, RRR. No JVD, murmurs, gallops, clicks or pedal edema. Gastrointestinal system: Abdomen is nondistended, soft and nontender. Normal bowel sounds heard. Central nervous system: Alert and oriented. No focal neurological deficits. Extremities: edema LUE elbow, FROM, dry gangrene RLE, slow healing wounds BLE.   Data Reviewed: Basic Metabolic Panel:  Recent Labs Lab 05/28/17 1616 05/29/17 0408 05/30/17 0441 05/31/17 0525  NA 136 135 142 141  K 3.4* 3.9 3.7 3.4*  CL 104 105 114* 115*  CO2 20* 19* 18* 18*  GLUCOSE 82 69 77 81  BUN 46* 40* 36* 29*  CREATININE 2.48* 2.07* 1.71* 1.48*  CALCIUM 7.8* 7.4* 7.8* 8.0*   Liver Function Tests:  Recent  Labs Lab 05/28/17 1616 05/29/17 0408 05/30/17 0441 05/31/17 0525  AST 14* 14* 14* 14*  ALT 8* 6* 10* 8*  ALKPHOS 94 95 75 80  BILITOT 0.5 0.5 0.8 0.6  PROT 5.3* 4.8* 4.8* 4.8*  ALBUMIN 2.4* 2.2* 2.1* 2.2*   No results for input(s): LIPASE, AMYLASE in the last 168 hours. No results for input(s): AMMONIA in the last 168 hours. CBC:  Recent Labs Lab 05/28/17 1616 05/29/17 0408 05/30/17 0441  WBC 21.5* 21.6* 20.8*  NEUTROABS 18.9*  --   --   HGB 8.8* 8.4* 8.1*  HCT 30.5* 29.1* 28.0*  MCV 83.1 82.9 83.1  PLT 512* 532* 549*   Cardiac Enzymes: No results for input(s): CKTOTAL, CKMB, CKMBINDEX, TROPONINI in the last 168 hours. CBG (last 3)  No results for input(s): GLUCAP in the last 72 hours. Recent Results (from the past 240 hour(s))  Blood Culture (routine x 2)     Status: None (Preliminary result)   Collection Time: 05/28/17  4:16 PM  Result Value Ref Range Status   Specimen Description PORTA CATH DRAWN BY RN  Final   Special Requests   Final    BOTTLES DRAWN AEROBIC AND ANAEROBIC Blood Culture results may not be optimal due to an inadequate volume of blood received in culture bottles   Culture NO GROWTH 3 DAYS  Final   Report Status PENDING  Incomplete  Blood Culture (routine x 2)     Status: None (Preliminary result)   Collection Time: 05/28/17  4:16 PM  Result Value Ref Range Status   Specimen Description LEFT ANTECUBITAL  Final   Special Requests   Final    BOTTLES DRAWN AEROBIC AND ANAEROBIC Blood Culture adequate volume   Culture NO GROWTH 3 DAYS  Final   Report Status PENDING  Incomplete  MRSA PCR Screening     Status: None   Collection Time: 05/28/17  9:53 PM  Result Value Ref Range Status   MRSA by PCR NEGATIVE NEGATIVE Final    Comment:        The GeneXpert MRSA Assay (FDA approved for NASAL specimens only), is one component of a comprehensive MRSA colonization surveillance program. It is not intended to diagnose MRSA infection nor to guide  or monitor treatment for MRSA infections.      Studies: No results found. Scheduled Meds: . amiodarone  200 mg Oral Daily  . aspirin EC  81 mg Oral Daily  . Chlorhexidine Gluconate Cloth  6 each Topical Daily  . collagenase   Topical Daily  . diltiazem  240 mg Oral Daily  . feeding supplement (ENSURE ENLIVE)  237 mL Oral BID BM  . furosemide  40 mg Oral Daily  . gabapentin  100 mg Oral TID  . hydroxyurea  1,000 mg Oral Daily  .  levothyroxine  75 mcg Oral QAC breakfast  . midodrine  5 mg Oral TID WC  . multivitamin with minerals  1 tablet Oral Daily  . potassium chloride  40 mEq Oral BID  . pravastatin  10 mg Oral q1800  . sodium chloride flush  10-40 mL Intracatheter Q12H  . Warfarin - Pharmacist Dosing Inpatient   Does not apply q1800   Continuous Infusions: . piperacillin-tazobactam (ZOSYN)  IV 3.375 g (05/31/17 0750)  . vancomycin Stopped (05/30/17 1735)    Principal Problem:   Sepsis ([place]) Active Problems:   Chronic atrial fibrillation (HCC)   Anticoagulant long-term use   HTN (hypertension)   Hypothyroidism   Peripheral vascular disease (HCC)   Cellulitis   Acute renal failure (ARF) (HCC)   Hypotension   Pressure ulcer  Time spent:   Irwin Brakeman, MD, FAAFP [facility] Pager [phone]  If 7PM-7AM, please contact night-coverage [email or web address] Password [email or web address] 05/31/2017, 9:13 AM    LOS: 3 days ]

## 2017-06-01 LAB — PROTIME-INR
INR: 2.37
PROTHROMBIN TIME: 25.7 s — AB (ref 11.4–15.2)

## 2017-06-01 MED ORDER — LEVOTHYROXINE SODIUM 75 MCG PO TABS: 75.0000 ug | ORAL_TABLET | Freq: Every day | ORAL | Status: DC

## 2017-06-01 MED ORDER — FUROSEMIDE 40 MG PO TABS: 40.0000 mg | ORAL_TABLET | ORAL | Status: DC

## 2017-06-01 MED ORDER — WARFARIN SODIUM 2.5 MG PO TABS: 2.5000 mg | ORAL_TABLET | Freq: Every day | ORAL | Status: DC

## 2017-06-01 MED ORDER — DOXYCYCLINE HYCLATE 100 MG PO CAPS: 100.0000 mg | ORAL_CAPSULE | Freq: Two times a day (BID) | ORAL | 0 refills | Status: AC

## 2017-06-01 MED ORDER — ENSURE ENLIVE PO LIQD: 237.0000 mL | Freq: Two times a day (BID) | ORAL | 0 refills | Status: DC

## 2017-06-01 MED ORDER — ENSURE ENLIVE PO LIQD: 237.0000 mL | Freq: Two times a day (BID) | ORAL | 12 refills | Status: DC

## 2017-06-01 MED ORDER — WARFARIN SODIUM 2.5 MG PO TABS: 2.5000 mg | ORAL_TABLET | Freq: Every day | ORAL | 0 refills | Status: DC

## 2017-06-01 MED ORDER — ADULT MULTIVITAMIN W/MINERALS CH: 1.0000 | ORAL_TABLET | Freq: Every day | ORAL | Status: DC

## 2017-06-01 MED ORDER — WARFARIN SODIUM 2.5 MG PO TABS
2.5000 mg | ORAL_TABLET | Freq: Once | ORAL | Status: AC
  Administered 2017-06-01: 2.5 mg via ORAL
  Filled 2017-06-01: qty 1

## 2017-06-01 MED ORDER — DOXYCYCLINE HYCLATE 100 MG PO CAPS: 100.0000 mg | ORAL_CAPSULE | Freq: Two times a day (BID) | ORAL | 0 refills | Status: DC

## 2017-06-01 MED ORDER — AMIODARONE HCL 200 MG PO TABS: 200.0000 mg | ORAL_TABLET | Freq: Every day | ORAL | Status: DC

## 2017-06-01 MED ORDER — MIDODRINE HCL 5 MG PO TABS
5.0000 mg | ORAL_TABLET | Freq: Three times a day (TID) | ORAL | Status: DC
Start: 2017-06-01 — End: 2017-06-11

## 2017-06-01 MED ORDER — ASPIRIN 81 MG PO TBEC: 81.0000 mg | DELAYED_RELEASE_TABLET | Freq: Every day | ORAL | Status: AC

## 2017-06-01 MED ORDER — PRAVASTATIN SODIUM 10 MG PO TABS
10.0000 mg | ORAL_TABLET | Freq: Every day | ORAL | Status: DC
Start: 2017-06-01 — End: 2017-12-08

## 2017-06-01 MED ORDER — POTASSIUM CHLORIDE CRYS ER 20 MEQ PO TBCR: 40.0000 meq | EXTENDED_RELEASE_TABLET | ORAL | Status: DC

## 2017-06-01 NOTE — Progress Notes (Addendum)
Pharmacy Antibiotic Note  Charlotte [name] is a 76 y.o. female admitted on 05/28/2017 with sepsis and a fib  Pharmacy has been consulted for vancomycin,  VT reported as 11mcg/ml, just slightly above goal. Renal function continues to improve and expect patient had some accumulation of vancomycin, therefore will continue current dose.    Plan: Cont vanc 500 mg IV q24 hours Goal Tr is 15-54mcg/ml. LOT through  approximately November 9 (appt with MD to assess foot 11/7) F/u renal function, cultures and clinical course F/U Vancomycin levels in 3-7 days  Height: 6' (182.9 cm) Weight: 138 lb 10.7 oz (62.9 kg) IBW/kg (Calculated) : 73.1  Temp (24hrs), Avg:97.7 F (36.5 C), Min:97.5 F (36.4 C), Max:98.2 F (36.8 C)   Recent Labs Lab 05/28/17 1616 05/28/17 1620 05/28/17 1828 05/28/17 2246 05/29/17 0408 05/30/17 0441 05/31/17 0525 05/31/17 1538  WBC 21.5*  --   --   --  21.6* 20.8*  --   --   CREATININE 2.48*  --   --   --  2.07* 1.71* 1.48*  --   LATICACIDVEN  --  2.33* 2.5* 2.0* 2.0*  --   --   --   VANCOTROUGH  --   --   --   --   --   --   --  21*    Estimated Creatinine Clearance: 33.1 mL/min (A) (by C-G formula based on SCr of 1.48 mg/dL (H)).    No Known Allergies   Antibiotics:  Vancomycin 10/25>> Zosyn 10/25 >>10/29 Micro: 10/25 Blood cx: ngtd 10/25 MRSPCR is negative  Thank you for allowing pharmacy to be a part of this patient's care.  Isac Sarna, BS Pharm D, [place] Clinical Pharmacist Pager [phone] 06/01/2017 11:38 AM

## 2017-06-01 NOTE — Clinical Social Work Note (Signed)
Patient declined at [facility] due to lack of bed availability.   Patient and family have made the decision to go home with HHPT services and IV antibiotics.    LCSW signing off.     Derrien Anschutz, Clydene Pugh, LCSW

## 2017-06-01 NOTE — NC FL2 (Signed)
[place] MEDICAID FL2 LEVEL OF CARE SCREENING TOOL     IDENTIFICATION  Patient Name: Charlotte [name] Birthdate: [birth year] Sex: female Admission Date (Current Location): 05/28/2017  [facility] and [place] Number:  [employer] and Address:  [place] [address], [place]      Provider Number: [id number]  Attending Physician Name and Address:  Murlean Iba, MD  Relative Name and Phone Number:       Current Level of Care: Hospital Recommended Level of Care: [place] Prior Approval Number:    Date Approved/Denied:   PASRR Number: [id number]  Discharge Plan: Home    Current Diagnoses: Patient Active Problem List   Diagnosis Date Noted  . Pressure ulcer 05/29/2017  . Sepsis ([place]) 05/28/2017  . Hypotension 05/28/2017  . Pressure injury of skin 03/14/2017  . Acute renal failure (ARF) ([place])   . Hyperlipidemia 03/05/2017  . Lower extremity arterial insufficiency, severe, right ([place]) 03/04/2017  . Chronic respiratory failure with hypoxia ([place]) 02/12/2017  . CAP (community acquired pneumonia) 02/12/2017  . Cellulitis 07/03/2016  . Anemia 07/03/2016  . Cough 11/29/2015  . Hypoxemia 11/29/2015  . Community acquired pneumonia 11/29/2015  . CHF (congestive heart failure) ([place]) 11/29/2015  . Acute on chronic respiratory failure with hypoxia ([place])   . Pleural effusion on left   . Acute on chronic diastolic (congestive) heart failure ([place])   . Dyspnea 11/22/2014  . Pleural effusion 11/22/2014  . Hard of hearing 11/22/2014  . Thrombocytosis ([place]) 11/22/2014  . Acute respiratory failure with hypoxemia ([place]) 11/22/2014  . Encounter for therapeutic drug monitoring 08/31/2013  . Aftercare following surgery of the circulatory system, [place] 12/21/2012  . Pain in limb 10/19/2012  . Peripheral vascular disease ([place]) 09/24/2012  . Swollen leg 09/24/2012  . Varicose veins of lower extremities with other complications  12/87/8676  . 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 Wound infection after surgery 01/07/2012  . Compartment syndrome, nontraumatic, lower extremity 11/29/2011  . Pain of right thigh 11/28/2011  . Hip fracture, right ([place]) 11/15/2011  . Chronic atrial fibrillation ([place]) 11/15/2011  . Anticoagulant long-term use 11/15/2011  . HTN (hypertension) 11/15/2011  . Hypothyroidism 11/15/2011  . Polycythemia vera ([place]) 10/01/2011    Orientation RESPIRATION BLADDER Height & Weight     Self, Time, Place, Situation  Normal Continent Weight: 138 lb 10.7 oz (62.9 kg) Height:  6' (182.9 cm)  BEHAVIORAL SYMPTOMS/MOOD NEUROLOGICAL BOWEL NUTRITION STATUS      Continent  (Regular)  AMBULATORY STATUS COMMUNICATION OF NEEDS Skin   Extensive Assist Verbally PU Stage and Appropriate Care, Other (Comment) (Stage II right heel posteria, lateral, medial; wound/incision: right leg lower medial; wound, right toe distal, 4th and 5th toenail necrosis)                       Personal Care Assistance Level of Assistance  Feeding, Dressing, Bathing Bathing Assistance: Limited assistance Feeding assistance: Independent Dressing Assistance: Limited assistance     Functional Limitations Info  Sight, Hearing, Speech Sight Info: Adequate Hearing Info: Adequate Speech Info: Adequate    SPECIAL CARE FACTORS FREQUENCY                       Contractures Contractures Info: Not present    Additional Factors Info  Code Status Code Status Info: DNR  Current Medications (06/01/2017):  This is the current hospital active medication list Current Facility-Administered Medications  Medication Dose Route Frequency Provider Last Rate Last Dose  . amiodarone (PACERONE) tablet 200 mg  200 mg Oral Daily Karmen Bongo, MD   200 mg at 06/01/17 1005  . aspirin EC tablet 81 mg  81 mg Oral Daily Karmen Bongo, MD   81 mg at 06/01/17 1005  . collagenase (SANTYL) ointment   Topical Daily Johnson, Clanford L, MD      . diltiazem (CARDIZEM CD) 24 hr capsule 240 mg  240 mg Oral Daily Karmen Bongo, MD   240 mg at 06/01/17 1007  . feeding supplement (ENSURE ENLIVE) (ENSURE ENLIVE) liquid 237 mL  237 mL Oral BID BM Johnson, Clanford L, MD   237 mL at 06/01/17 1000  . furosemide (LASIX) tablet 40 mg  40 mg Oral Daily Johnson, Clanford L, MD   40 mg at 06/01/17 1008  . gabapentin (NEURONTIN) capsule 100 mg  100 mg Oral TID Karmen Bongo, MD   100 mg at 06/01/17 1005  . hydroxyurea (HYDREA) capsule 1,000 mg  1,000 mg Oral Daily Karmen Bongo, MD   1,000 mg at 06/01/17 1011  . levothyroxine (SYNTHROID, LEVOTHROID) tablet 75 mcg  75 mcg Oral QAC breakfast Karmen Bongo, MD   75 mcg at 06/01/17 0640  . midodrine (PROAMATINE) tablet 5 mg  5 mg Oral TID WC Milton Ferguson, MD   5 mg at 06/01/17 1006  . multivitamin with minerals tablet 1 tablet  1 tablet Oral Daily Johnson, Clanford L, MD   1 tablet at 06/01/17 1008  . piperacillin-tazobactam (ZOSYN) IVPB 3.375 g  3.375 g Intravenous Lynne Logan, MD 12.5 mL/hr at 06/01/17 1005 3.375 g at 06/01/17 1005  . potassium chloride SA (K-DUR,KLOR-CON) CR tablet 40 mEq  40 mEq Oral BID Johnson, Clanford L, MD   40 mEq at 06/01/17 1005  . pravastatin (PRAVACHOL) tablet 10 mg  10 mg Oral q1800 Karmen Bongo, MD   10 mg at 05/31/17 1703  . sodium chloride flush (NS) 0.9 % injection 10-40 mL  10-40 mL Intracatheter Q12H Johnson, Clanford L, MD   10 mL at 06/01/17 1008  . sodium chloride flush (NS) 0.9 % injection 10-40 mL  10-40 mL Intracatheter PRN Johnson, Clanford L, MD      . vancomycin (VANCOCIN) 500 mg in sodium chloride 0.9 % 100 mL IVPB  500 mg Intravenous Q24H Karmen Bongo, MD   Stopped at 05/31/17 1800  . warfarin (COUMADIN) tablet 2.5 mg  2.5 mg Oral Once Murlean Iba, MD      . Warfarin - Pharmacist Dosing Inpatient   Does not apply  q1800 Murlean Iba, MD         Discharge Medications: Please see discharge summary for a list of discharge medications.  Relevant Imaging Results:  Relevant Lab Results:   Additional Information SS#: [id number]. Patient will need IV antibiotics (Vancomycin) through approximately June 12, 2017.   Jerrica Thorman, Clydene Pugh, LCSW

## 2017-06-01 NOTE — Care Management (Signed)
Patient discharging today. Patient only wanted [facility] or [facility] but was declined. She will return home with resumption of home health with [facility] and IV antibiotics (provided by [facility]).  Sarah of [facility] and Quita Skye of [facility] have been notified.  Patient will receive today's dose of Vancomycin and be DC'd home. Patient's husband plans to take her home. She has oxygen pta, husband will bring port tank for transport home.

## 2017-06-01 NOTE — Discharge Instructions (Signed)
Follow with Primary MD  [name], MD  and other consultant's as instructed your Hospitalist MD  Please get a complete blood count and chemistry panel checked by your Primary MD at your next visit, and again as instructed by your Primary MD.  Get Medicines reviewed and adjusted: Please take all your medications with you for your next visit with your Primary MD  Laboratory/radiological data: Please request your Primary MD to go over all hospital tests and procedure/radiological results at the follow up, please ask your Primary MD to get all Hospital records sent to his/her office.  In some cases, they will be blood work, cultures and biopsy results pending at the time of your discharge. Please request that your primary care M.D. follows up on these results.  Also Note the following: If you experience worsening of your admission symptoms, develop shortness of breath, life threatening emergency, suicidal or homicidal thoughts you must seek medical attention immediately by calling 911 or calling your MD immediately  if symptoms less severe.  You must read complete instructions/literature along with all the possible adverse reactions/side effects for all the Medicines you take and that have been prescribed to you. Take any new Medicines after you have completely understood and accpet all the possible adverse reactions/side effects.   Do not drive when taking Pain medications or sleeping medications (Benzodaizepines)  Do not take more than prescribed Pain, Sleep and Anxiety Medications. It is not advisable to combine anxiety,sleep and pain medications without talking with your primary care practitioner  Special Instructions: If you have smoked or chewed Tobacco  in the last 2 yrs please stop smoking, stop any regular Alcohol  and or any Recreational drug use.  Wear Seat belts while driving.  Please note: You were cared for by a hospitalist during your hospital stay. Once you are discharged,  your primary care physician will handle any further medical issues. Please note that NO REFILLS for any discharge medications will be authorized once you are discharged, as it is imperative that you return to your primary care physician (or establish a relationship with a primary care physician if you do not have one) for your post hospital discharge needs so that they can reassess your need for medications and monitor your lab values.

## 2017-06-01 NOTE — Clinical Social Work Note (Signed)
Clinical Social Work Assessment  Patient Details  Name: Charlotte [name] MRN: [id number] Date of Birth: 1940/10/08  Date of referral:  06/01/17               Reason for consult:  Facility Placement                Permission sought to share information with:    Permission granted to share information::     Name::        Agency::     Relationship::     Contact Information:     Housing/Transportation Living arrangements for the past 2 months:  Mobile Home Source of Information:  Patient Patient Interpreter Needed:  None Criminal Activity/Legal Involvement Pertinent to Current Situation/Hospitalization:    Significant Relationships:  Adult Children, Spouse Lives with:  Adult Children, Spouse Do you feel safe going back to the place where you live?  Yes Need for family participation in patient care:  Yes (Comment) (Family assists with ADL's)  Care giving concerns:  No care giving concerns identified.   Social Worker assessment / plan: Pt is a 76 year old female admitted with sepsis. CSW referral received for placement needs. Reviewed pt's record today and met with pt at bedside to assess. Pt lives with her husband and adult son in a mobile home here in [place]. There is a ramped entrance to the home. Pt uses a wheeled walker at home. Pt has been at [facility] for the past two months where she was receiving care after a femoral bypass. Pt was home for one night and was brought to the ED for additional care. Discussed SNF referral options with pt. Provided list of local facilities. Pt requests referrals to [facility] and [facility]. Referrals started and will await decisions. CSW spoke with pt's husband by phone at pt request. Pt's husband also agreeable to the plan. Pt will need ambulance transport at dc.   Employment status:  Retired Forensic scientist:  Medicare PT Recommendations:  [place] / Referral to community resources:      Patient/Family's Response to care: Pt and family agreeable.  Patient/Family's Understanding of and Emotional Response to Diagnosis, Current Treatment, and Prognosis:  Pt and family appear to understand and are agreeable to the recommendations.   Emotional Assessment Appearance:  Appears stated age Attitude/Demeanor/Rapport:    Affect (typically observed):  Accepting, Calm, Pleasant Orientation:  Oriented to Self, Oriented to Place, Oriented to  Time, Oriented to Situation Alcohol / Substance use:    Psych involvement (Current and /or in the community):     Discharge Needs  Concerns to be addressed:    Readmission within the last 30 days:  No Current discharge risk:  None Barriers to Discharge:  No Barriers Identified   Shade Flood, LCSW 06/01/2017, 11:30 AM

## 2017-06-01 NOTE — Care Management Important Message (Signed)
Important Message  Patient Details  Name: Charlotte [name] MRN: [id number] Date of Birth: 1940-10-22   Medicare Important Message Given:  Yes    Charlotte [name], Charlotte Reading, RN 06/01/2017, 10:57 AM

## 2017-06-01 NOTE — Progress Notes (Signed)
Patient and husband state understanding of discharge instructions

## 2017-06-01 NOTE — Progress Notes (Signed)
ANTICOAGULATION CONSULT NOTE -  Pharmacy Consult for Coumadin Indication: atrial fibrillation  No Known Allergies  Patient Measurements: Height: 6' (182.9 cm) Weight: 138 lb 10.7 oz (62.9 kg) IBW/kg (Calculated) : 73.1 HEPARIN DW (KG): 62.9  Vital Signs: Temp: 97.5 F (36.4 C) (10/29 0504) Temp Source: Oral (10/29 0504) BP: 119/66 (10/29 0504) Pulse Rate: 71 (10/29 0504)  Labs:  Recent Labs  05/29/17 1208 05/29/17 2043 05/30/17 0441 05/31/17 0525 06/01/17 0508  HGB  --   --  8.1*  --   --   HCT  --   --  28.0*  --   --   PLT  --   --  549*  --   --   APTT 113* 72*  --   --   --   LABPROT  --   --  19.9* 23.8* 25.7*  INR  --   --  1.71 2.15 2.37  HEPARINUNFRC 2.20*  --   --   --   --   CREATININE  --   --  1.71* 1.48*  --     Estimated Creatinine Clearance: 33.1 mL/min (A) (by C-G formula based on SCr of 1.48 mg/dL (H)).   Medications:  Prescriptions Prior to Admission  Medication Sig Dispense Refill Last Dose  . acetaminophen (TYLENOL) 325 MG tablet Take 2 tablets (650 mg total) by mouth every 6 (six) hours as needed for mild pain or fever.   unknown  . amiodarone (PACERONE) 200 MG tablet Take 1 tablet (200 mg total) by mouth 2 (two) times daily. (Patient taking differently: Take 200 mg by mouth daily. )   Past Week at Unknown time  . diltiazem (CARDIZEM CD) 240 MG 24 hr capsule Take 240 mg by mouth daily.   Past Week at Unknown time  . furosemide (LASIX) 40 MG tablet Take 40 mg by mouth daily.   Past Week at Unknown time  . furosemide (LASIX) 80 MG tablet Take 80 mg by mouth daily.   Past Week at Unknown time  . gabapentin (NEURONTIN) 100 MG capsule Take 100 mg by mouth 3 (three) times daily.     Past Week at Unknown time  . hydroxyurea (HYDREA) 500 MG capsule Take 2 capsules (1,000 mg total) by mouth daily. May take with food to minimize GI side effects. 60 capsule 2 Past Week at Unknown time  . metolazone (ZAROXOLYN) 2.5 MG tablet Take 2.5 mg by mouth every  Monday, Wednesday, and Friday.   Past Week at Unknown time  . metoprolol tartrate (LOPRESSOR) 100 MG tablet Take 100 mg by mouth 2 (two) times daily.   05/28/2017 at [facility]  . pravastatin (PRAVACHOL) 20 MG tablet Take 10 mg by mouth daily.   Past Week at Unknown time  . torsemide (DEMADEX) 20 MG tablet Take 40 mg by mouth daily.   Past Week at Unknown time  . vancomycin 500 mg in sodium chloride 0.9 % 100 mL Inject 500 mg into the vein daily.     Marland Kitchen warfarin (COUMADIN) 5 MG tablet Take 2.5-5 mg by mouth See admin instructions. Take one tablet all days except take 2.5mg  on MWF   Past Week at Unknown time    Assessment: 76 yo female just discharged from [facility] 05/27/17 after being in [facility] from 8/1-26 for septic shock with limb ischemia and cellulitis and was discharged to [facility]).  She was on Eliquis at [place] and discharged home with this but is unable to afford. She has been  on Coumadin previously. In addition, she was supposed to be taking Vancomycin infusions for RLE cellulits but unable to afford.  Pharmacy asked to start Coumadin, patient unable to afford DOACs. Now on lovenox bridge with Coumadin. INR therapeutic at 2.37.   Goal of Therapy:  INR 2-3 Monitor platelets by anticoagulation protocol: Yes   Plan:  Coumadin 2.5mg  po x 1 today Daily PT-INR Continue to monitor H&H and platelets  Isac Sarna, BS Vena Austria, BCPS Clinical Pharmacist Pager [phone] 06/01/2017,8:13 AM

## 2017-06-01 NOTE — Discharge Summary (Addendum)
Physician Discharge Summary  Charlotte [name] WCH:[id number] DOB: May 20, 1941 DOA: 05/28/2017  PCP: [name], MD VVS: Dr. [name]  Admit date: 05/28/2017 Discharge date: 06/01/2017  Admitted From: Home  Disposition: SNF--->Family decided to take patient home with [facility] services  Recommendations for Outpatient Follow-up:  1. Follow up with PCP in 2 weeks 2. Follow up with vascular surgeon on 11/9 as scheduled 3. Check PT/INR in 2 days and adjust warfarin dose as needed to keep INR 2-3.  4. Continue vancomycin 500 mg IV every 24 hours thru 06/12/17.  5. Please check Vanc Trough in 3-7 days 6. Take doxycycline BID x 10 days then STOP 7. Please follow BMP/CBC in 3-7 days.  8. Please check TSH in 2 months.  9. Continue IV vancomycin through 06/12/17 as already arranged.  10. Remove central line when IV antibiotics completed. 11. Please follow up on the following pending results: final culture results  Discharge Condition: STABLE   CODE STATUS: DNR   Brief Hospitalization Summary: Please see all hospital notes, images, labs for full details of the hospitalization.  HPI: Charlotte [name] is a 76 y.o. 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  She was admitted to [facility] from 8/1-26 for septic shock with limb ischemia and cellulitis and was discharged to [facility]).  Patient was discharged from [place] yesterday.  She slept well overnight.  This AM, she took her BP medication - she took 100 mg metoprolol at 0500 (records are conflicting but it appears that the plan was to decrease this medication to 25 mg daily and she also has been on midodrine at [facility] for hypotension).  She was feeling  good, but the home health nurse came and checked her BP and it was 60/40.  She is "supposed to be taking the kind to bring it up instead of the kind to lower it."  The home health nurse recommended that she come in for evaluation.  No SOB.  She had been on home O2 prior but they took her off it at [place].  No cough or wheezing.  She had dysuria at [facility], diagnosed with a yeast infection.  She has not complained of further urinary symptoms.  She has been taking Lasix for fluid and she took it yesterday.  ?foot swelling on the right only, which they thought was due to her bandage being too tight.  She is also supposed to be receiving Vanc infusions for RLE cellulitis but they were unable to afford the Vanc and so she did not receive her home dose today.  Finally, they were previously on Coumadin and appear to have been changed to Eliquis, but they are unable to afford this medication.  ED Course:   Patient with hypotension most likely related to medication. She still will be treated as a possible sepsis and admitted  HPI: Charlotte [name] is a 76 y.o. 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  She was admitted to [facility] from 8/1-26 for septic shock with limb ischemia and cellulitis  and was discharged to [facility]).  Patient was discharged from [place] yesterday.  She slept well overnight.  This AM, she took her BP medication - she took 100 mg metoprolol at 0500 (records are conflicting but it appears that the plan was to decrease this medication to 25 mg daily and she also has been on midodrine at [facility] for hypotension).  She was feeling good, but the home health nurse came and checked her BP and it was  60/40.  She is "supposed to be taking the kind to bring it up instead of the kind to lower it."  The home health nurse recommended that she come in for evaluation.  No SOB.  She had been on home O2 prior but they took her off it at [place].  No cough or wheezing.  She had dysuria at [facility], diagnosed with a yeast infection.  She has not complained of further urinary symptoms.  She has been taking Lasix for fluid and she took it yesterday.  ?foot swelling on the right only, which they thought was due to her bandage being too tight.  She is also supposed to be receiving Vanc infusions for RLE cellulitis but they were unable to afford the Vanc and so she did not receive her home dose today.  Finally, they were previously on Coumadin and appear to have been changed to Eliquis, but they are unable to afford this medication.  ED Course:   Patient with hypotension most likely related to medication. 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  MDM/Assessment & Plan:   1. Hypotension - resolved with IVF hydration, resumed home mitodrine. Pt feeling much better and BPs much better.   2. Presumed Sepsis - resolved now.  likely secondary to cellulitis RLE and dry gangrene - continue IV antibiotics, supportive therapy, follow lactate as it trends down.  Clinically patient is much improved.  Planning to discharge to SNF.  Continue IV vancomycin thru 11/9.   3. Cellulitis RLE - apparently this was diagnosed during months long stay at [place] (just discharged 2 days PTA).  Continue IV vancomycin thru 11/9.  Check vanc  trough in 3-7 days, follow BMP in 3-7 days.   4. Pneumonia - doxycycline 100 mg BID x 10 days.  Discharge with home oxygen.  Follow up recheck with PCP, likely will not need oxygen very long.  5. Hypokalemia - repleted orally.   6. Polycythemia - WBC is markedly improved compared to previous tests where is had been as high as 45.  Continue hydrea. Follow up with heme/onc outpatient as scheduled.  7. PVD with dry gangrene RLE - Pt was recently seen by vascular surgeon and is still at high risk for needing right AKA.  She has follow up planned with Dr. [name] for repeat ABI studies.   8. Acute kidney injury-improving.  This was likely prerenal and improved with IV hydration.  9. Atrial fibrillation-unfortunately patient had not been on anticoagulation as she could not afford eliquis.   The family is reporting they are not able to afford Eliquis-she apparently was changed to this while at [place].  Pt restarted on warfarin/lovenox.  INR therapeutic. DC'd lovenox. Continue warfarin.  recheck PT/INR in 2 days.  She will be maintained on amiodarone and Cardizem for rate control. 10. Hypothyroidism-TSH is trending down from 17 in August 2018- now down to 11.  Continue thyroid supplementation.  Recheck TSH in a few months.   DVT prophylaxis: warfarin Code Status: DNR Family Communication: husband Disposition Plan: SNF   Discharge Diagnoses:  Principal Problem:   Sepsis ([place]) Active Problems:   Chronic atrial fibrillation (HCC)   Anticoagulant long-term use   HTN (hypertension)   Hypothyroidism   Peripheral vascular disease (HCC)   Cellulitis   Acute renal failure (ARF) (HCC)   Hypotension   Pressure ulcer  Discharge Instructions: Discharge Instructions    (HEART FAILURE PATIENTS) Call MD:  Anytime you have any of the following symptoms: 1) 3 pound weight gain in 24 hours or 5 pounds in 1 week 2) shortness of breath, with or without a dry hacking cough 3) swelling in the hands, feet or stomach 4)  if you have to sleep on extra pillows at night in order to breathe.    Complete by:  As directed    Call MD for:  difficulty breathing, headache or visual disturbances    Complete by:  As directed    Call MD for:  extreme fatigue    Complete by:  As directed    Call MD for:  hives    Complete by:  As directed    Call MD for:  persistant dizziness or light-headedness    Complete by:  As directed    Call MD for:  severe uncontrolled pain    Complete by:  As directed    Increase activity slowly    Complete by:  As directed      Allergies as of 06/01/2017   No Known Allergies     Medication List    STOP taking these medications   metolazone 2.5 MG tablet Commonly known as:  ZAROXOLYN   metoprolol tartrate 100 MG tablet Commonly known as:  LOPRESSOR   torsemide 20 MG tablet Commonly known as:  DEMADEX     TAKE these medications   acetaminophen 325 MG tablet Commonly known as:  TYLENOL Take 2 tablets (650 mg total) by mouth every 6 (six) hours as needed for mild pain or fever.   amiodarone 200 MG tablet Commonly known as:  PACERONE Take 1 tablet (200 mg total) by mouth daily.   aspirin 81 MG EC tablet Take 1 tablet (81 mg total) by mouth daily.   diltiazem 240 MG 24 hr capsule Commonly known as:  CARDIZEM CD Take 240 mg by mouth daily.   doxycycline 100 MG capsule Commonly known as:  VIBRAMYCIN Take 1 capsule (100 mg total) by mouth 2 (two) times daily.   feeding supplement (ENSURE ENLIVE) Liqd Take 237 mLs by mouth 2 (two) times daily between meals.   furosemide 40 MG tablet Commonly known as:  LASIX Take 1 tablet (40 mg total) by mouth every other day. What changed:  when to take this  Another medication with the same name was removed. Continue taking this medication, and follow the directions you see here.   gabapentin 100 MG capsule Commonly known as:  NEURONTIN Take 100 mg by mouth 3 (three) times daily.   hydroxyurea 500 MG capsule Commonly known  as:  HYDREA Take 2 capsules (1,000 mg total) by mouth daily. May take with food to minimize GI side effects.   levothyroxine 75 MCG  tablet Commonly known as:  SYNTHROID, LEVOTHROID Take 1 tablet (75 mcg total) by mouth daily before breakfast.   midodrine 5 MG tablet Commonly known as:  PROAMATINE Take 1 tablet (5 mg total) by mouth 3 (three) times daily with meals.   multivitamin with minerals Tabs tablet Take 1 tablet by mouth daily.   potassium chloride SA 20 MEQ tablet Commonly known as:  K-DUR,KLOR-CON Take 2 tablets (40 mEq total) by mouth every other day.   pravastatin 10 MG tablet Commonly known as:  PRAVACHOL Take 1 tablet (10 mg total) by mouth daily. What changed:  medication strength   vancomycin 500 mg in sodium chloride 0.9 % 100 mL Inject 500 mg into the vein daily.   warfarin 2.5 MG tablet Commonly known as:  COUMADIN Take 1 tablet (2.5 mg total) by mouth daily at 6 PM. Take one tablet all days except take 2.5mg  on MWF What changed:  medication strength  how much to take  when to take this      Follow-up Information    Waynetta Sandy, MD On 06/12/2017.   Specialties:  Vascular Surgery, Cardiology Why:  as already scheduled Contact information: [place] [address] [phone]        [name], MD In 1 week.   Specialty:  Family Medicine Why:  Call office for appointment Contact information: [address] [place] [address] [phone]        Twana First, MD On 06/22/2017.   Specialty:  Oncology Why:  at 9:30 am Contact information: [place] [address] [phone]          No Known Allergies Current Discharge Medication List    START taking these medications   Details  doxycycline (VIBRAMYCIN) 100 MG capsule Take 1 capsule (100 mg total) by mouth 2 (two) times daily. Qty: 20 capsule, Refills: 0    feeding supplement, ENSURE ENLIVE, (ENSURE ENLIVE) LIQD Take 237 mLs  by mouth 2 (two) times daily between meals. Qty: 237 mL, Refills: 12    midodrine (PROAMATINE) 5 MG tablet Take 1 tablet (5 mg total) by mouth 3 (three) times daily with meals.    Multiple Vitamin (MULTIVITAMIN WITH MINERALS) TABS tablet Take 1 tablet by mouth daily.    potassium chloride SA (K-DUR,KLOR-CON) 20 MEQ tablet Take 2 tablets (40 mEq total) by mouth every other day.      CONTINUE these medications which have CHANGED   Details  amiodarone (PACERONE) 200 MG tablet Take 1 tablet (200 mg total) by mouth daily.    aspirin EC 81 MG EC tablet Take 1 tablet (81 mg total) by mouth daily.    furosemide (LASIX) 40 MG tablet Take 1 tablet (40 mg total) by mouth every other day. Qty: 30 tablet    levothyroxine (SYNTHROID, LEVOTHROID) 75 MCG tablet Take 1 tablet (75 mcg total) by mouth daily before breakfast.    pravastatin (PRAVACHOL) 10 MG tablet Take 1 tablet (10 mg total) by mouth daily.    warfarin (COUMADIN) 2.5 MG tablet Take 1 tablet (2.5 mg total) by mouth daily at 6 PM. Take one tablet all days except take 2.5mg  on MWF      CONTINUE these medications which have NOT CHANGED   Details  acetaminophen (TYLENOL) 325 MG tablet Take 2 tablets (650 mg total) by mouth every 6 (six) hours as needed for mild pain or fever.    diltiazem (CARDIZEM CD) 240 MG 24 hr capsule Take 240 mg  by mouth daily.    gabapentin (NEURONTIN) 100 MG capsule Take 100 mg by mouth 3 (three) times daily.      hydroxyurea (HYDREA) 500 MG capsule Take 2 capsules (1,000 mg total) by mouth daily. May take with food to minimize GI side effects. Qty: 60 capsule, Refills: 2   Associated Diagnoses: Polycythemia vera (HCC)    vancomycin 500 mg in sodium chloride 0.9 % 100 mL Inject 500 mg into the vein daily.      STOP taking these medications     metolazone (ZAROXOLYN) 2.5 MG tablet      metoprolol tartrate (LOPRESSOR) 100 MG tablet      torsemide (DEMADEX) 20 MG tablet        Procedures/Studies: Dg  Chest Portable 1 View  Result Date: 05/28/2017 CLINICAL DATA:  Central catheter placement.  Hypotension. EXAM: PORTABLE CHEST 1 VIEW COMPARISON:  March 28, 2017 FINDINGS: Central catheter tip is in the superior vena cava. No pneumothorax. There is patchy airspace opacity in the medial left lung base. There are minimal pleural effusions bilaterally. Lungs elsewhere are clear. There is borderline cardiomegaly with mild pulmonary venous hypertension. No adenopathy. No bone lesions. IMPRESSION: Central catheter tip in superior vena cava. No pneumothorax. Pulmonary vascular congestion with small pleural effusions bilaterally. Medial airspace consolidation in the left base is likely due to superimposed pneumonia. Followup PA and lateral chest radiographs recommended in 3-4 weeks following trial of antibiotic therapy to ensure resolution and exclude underlying malignancy. Electronically Signed   By: Lowella Grip III M.D.   On: 05/28/2017 15:48     Subjective: Pt says she feels much better today.    Discharge Exam: Vitals:   06/01/17 0504 06/01/17 1139  BP: 119/66   Pulse: 71   Resp: 17   Temp: (!) 97.5 F (36.4 C)   SpO2: 93% (!) 86%   Vitals:   05/31/17 1400 05/31/17 2219 06/01/17 0504 06/01/17 1139  BP: 125/81 132/66 119/66   Pulse: 62 74 71   Resp: 18 18 17    Temp: (!) 97.5 F (36.4 C) 98.2 F (36.8 C) (!) 97.5 F (36.4 C)   TempSrc: Oral Oral Oral   SpO2: 94% 91% 93% (!) 86%  Weight:      Height:       General exam: hard of hearing. Awake, alert, NAD.  Respiratory system: Clear. No increased work of breathing. Cardiovascular system: S1 & S2 heard, RRR. No JVD, murmurs, gallops, clicks or pedal edema. Gastrointestinal system: Abdomen is nondistended, soft and nontender. Normal bowel sounds heard. Central nervous system: Alert and oriented. No focal neurological deficits. Extremities: edema LUE elbow, FROM, dry gangrene RLE, slow healing wounds BLE.    The results of  significant diagnostics from this hospitalization (including imaging, microbiology, ancillary and laboratory) are listed below for reference.     Microbiology: Recent Results (from the past 240 hour(s))  Blood Culture (routine x 2)     Status: None (Preliminary result)   Collection Time: 05/28/17  4:16 PM  Result Value Ref Range Status   Specimen Description PORTA CATH DRAWN BY RN  Final   Special Requests   Final    BOTTLES DRAWN AEROBIC AND ANAEROBIC Blood Culture results may not be optimal due to an inadequate volume of blood received in culture bottles   Culture NO GROWTH 4 DAYS  Final   Report Status PENDING  Incomplete  Blood Culture (routine x 2)     Status: None (Preliminary result)   Collection  Time: 05/28/17  4:16 PM  Result Value Ref Range Status   Specimen Description LEFT ANTECUBITAL  Final   Special Requests   Final    BOTTLES DRAWN AEROBIC AND ANAEROBIC Blood Culture adequate volume   Culture NO GROWTH 4 DAYS  Final   Report Status PENDING  Incomplete  MRSA PCR Screening     Status: None   Collection Time: 05/28/17  9:53 PM  Result Value Ref Range Status   MRSA by PCR NEGATIVE NEGATIVE Final    Comment:        The GeneXpert MRSA Assay (FDA approved for NASAL specimens only), is one component of a comprehensive MRSA colonization surveillance program. It is not intended to diagnose MRSA infection nor to guide or monitor treatment for MRSA infections.      Labs: BNP (last 3 results)  Recent Labs  02/12/17 1505 03/11/17 2115 05/28/17 1616  BNP 608.0* 1,374.8* 983.3*   Basic Metabolic Panel:  Recent Labs Lab 05/28/17 1616 05/29/17 0408 05/30/17 0441 05/31/17 0525  NA 136 135 142 141  K 3.4* 3.9 3.7 3.4*  CL 104 105 114* 115*  CO2 20* 19* 18* 18*  GLUCOSE 82 69 77 81  BUN 46* 40* 36* 29*  CREATININE 2.48* 2.07* 1.71* 1.48*  CALCIUM 7.8* 7.4* 7.8* 8.0*   Liver Function Tests:  Recent Labs Lab 05/28/17 1616 05/29/17 0408 05/30/17 0441  05/31/17 0525  AST 14* 14* 14* 14*  ALT 8* 6* 10* 8*  ALKPHOS 94 95 75 80  BILITOT 0.5 0.5 0.8 0.6  PROT 5.3* 4.8* 4.8* 4.8*  ALBUMIN 2.4* 2.2* 2.1* 2.2*   No results for input(s): LIPASE, AMYLASE in the last 168 hours. No results for input(s): AMMONIA in the last 168 hours. CBC:  Recent Labs Lab 05/28/17 1616 05/29/17 0408 05/30/17 0441  WBC 21.5* 21.6* 20.8*  NEUTROABS 18.9*  --   --   HGB 8.8* 8.4* 8.1*  HCT 30.5* 29.1* 28.0*  MCV 83.1 82.9 83.1  PLT 512* 532* 549*   Cardiac Enzymes: No results for input(s): CKTOTAL, CKMB, CKMBINDEX, TROPONINI in the last 168 hours. BNP: Invalid input(s): POCBNP CBG: No results for input(s): GLUCAP in the last 168 hours. D-Dimer No results for input(s): DDIMER in the last 72 hours. Hgb A1c No results for input(s): HGBA1C in the last 72 hours. Lipid Profile No results for input(s): CHOL, HDL, LDLCALC, TRIG, CHOLHDL, LDLDIRECT in the last 72 hours. Thyroid function studies No results for input(s): TSH, T4TOTAL, T3FREE, THYROIDAB in the last 72 hours.  Invalid input(s): FREET3 Anemia work up No results for input(s): VITAMINB12, FOLATE, FERRITIN, TIBC, IRON, RETICCTPCT in the last 72 hours. Urinalysis    Component Value Date/Time   COLORURINE YELLOW 03/09/2017 1648   APPEARANCEUR CLEAR 03/09/2017 1648   LABSPEC 1.021 03/09/2017 1648   PHURINE 5.0 03/09/2017 1648   GLUCOSEU NEGATIVE 03/09/2017 1648   HGBUR MODERATE (A) 03/09/2017 1648   BILIRUBINUR NEGATIVE 03/09/2017 1648   KETONESUR NEGATIVE 03/09/2017 1648   PROTEINUR NEGATIVE 03/09/2017 1648   UROBILINOGEN 1.0 09/14/2012 2337   NITRITE NEGATIVE 03/09/2017 1648   LEUKOCYTESUR NEGATIVE 03/09/2017 1648   Sepsis Labs Invalid input(s): PROCALCITONIN,  WBC,  LACTICIDVEN Microbiology Recent Results (from the past 240 hour(s))  Blood Culture (routine x 2)     Status: None (Preliminary result)   Collection Time: 05/28/17  4:16 PM  Result Value Ref Range Status   Specimen  Description PORTA CATH DRAWN BY RN  Final   Special Requests  Final    BOTTLES DRAWN AEROBIC AND ANAEROBIC Blood Culture results may not be optimal due to an inadequate volume of blood received in culture bottles   Culture NO GROWTH 4 DAYS  Final   Report Status PENDING  Incomplete  Blood Culture (routine x 2)     Status: None (Preliminary result)   Collection Time: 05/28/17  4:16 PM  Result Value Ref Range Status   Specimen Description LEFT ANTECUBITAL  Final   Special Requests   Final    BOTTLES DRAWN AEROBIC AND ANAEROBIC Blood Culture adequate volume   Culture NO GROWTH 4 DAYS  Final   Report Status PENDING  Incomplete  MRSA PCR Screening     Status: None   Collection Time: 05/28/17  9:53 PM  Result Value Ref Range Status   MRSA by PCR NEGATIVE NEGATIVE Final    Comment:        The GeneXpert MRSA Assay (FDA approved for NASAL specimens only), is one component of a comprehensive MRSA colonization surveillance program. It is not intended to diagnose MRSA infection nor to guide or monitor treatment for MRSA infections.    Time coordinating discharge: 34 minutes  SIGNED:  Irwin Brakeman, MD  [facility] 06/01/2017, 1:02 PM Pager [phone]  If 7PM-7AM, please contact night-coverage [email or web address] Password [email or web address]

## 2017-06-01 NOTE — Care Management Note (Signed)
Case Management Note  Patient Details  Name: Charlotte [name] MRN: [id number] Date of Birth: [birth year]    Expected Discharge Date:  06/01/17               Expected Discharge Plan:  [place]  In-House Referral:     Discharge planning Services  CM Consult, Medication Assistance  Post Acute Care Choice:  Home Health, Resumption of Svcs/PTA Provider Choice offered to:     DME Arranged:    DME Agency:     HH Arranged:    [place] Agency:     Status of Service:  Completed, signed off  If discussed at [employer], dates discussed:    Additional Comments: Patient discharging today. PT recommends SNF. Patient agreeable. CSW working on placement.   Lajeana Strough, Chauncey Reading, RN 06/01/2017, 11:55 AM

## 2017-06-02 ENCOUNTER — Telehealth: Payer: Self-pay | Admitting: *Deleted

## 2017-06-02 DIAGNOSIS — T8140XD Infection following a procedure, unspecified, subsequent encounter: Secondary | ICD-10-CM | POA: Diagnosis not present

## 2017-06-02 DIAGNOSIS — L8990 Pressure ulcer of unspecified site, unspecified stage: Secondary | ICD-10-CM | POA: Diagnosis not present

## 2017-06-02 DIAGNOSIS — B9689 Other specified bacterial agents as the cause of diseases classified elsewhere: Secondary | ICD-10-CM | POA: Diagnosis not present

## 2017-06-02 DIAGNOSIS — I5042 Chronic combined systolic (congestive) and diastolic (congestive) heart failure: Secondary | ICD-10-CM | POA: Diagnosis not present

## 2017-06-02 DIAGNOSIS — L03115 Cellulitis of right lower limb: Secondary | ICD-10-CM | POA: Diagnosis not present

## 2017-06-02 DIAGNOSIS — L89890 Pressure ulcer of other site, unstageable: Secondary | ICD-10-CM | POA: Diagnosis not present

## 2017-06-02 DIAGNOSIS — I110 Hypertensive heart disease with heart failure: Secondary | ICD-10-CM | POA: Diagnosis not present

## 2017-06-02 LAB — CULTURE, BLOOD (ROUTINE X 2)
CULTURE: NO GROWTH
Culture: NO GROWTH
Special Requests: ADEQUATE

## 2017-06-02 NOTE — Telephone Encounter (Signed)
Please call Melissa @ [phone] w/ [facility] concerning the pt's dosage, she just got out of the hospital

## 2017-06-03 DIAGNOSIS — L89890 Pressure ulcer of other site, unstageable: Secondary | ICD-10-CM | POA: Diagnosis not present

## 2017-06-03 DIAGNOSIS — B9689 Other specified bacterial agents as the cause of diseases classified elsewhere: Secondary | ICD-10-CM | POA: Diagnosis not present

## 2017-06-03 DIAGNOSIS — T8140XD Infection following a procedure, unspecified, subsequent encounter: Secondary | ICD-10-CM | POA: Diagnosis not present

## 2017-06-03 DIAGNOSIS — I5042 Chronic combined systolic (congestive) and diastolic (congestive) heart failure: Secondary | ICD-10-CM | POA: Diagnosis not present

## 2017-06-03 DIAGNOSIS — L03115 Cellulitis of right lower limb: Secondary | ICD-10-CM | POA: Diagnosis not present

## 2017-06-03 DIAGNOSIS — I110 Hypertensive heart disease with heart failure: Secondary | ICD-10-CM | POA: Diagnosis not present

## 2017-06-03 NOTE — Telephone Encounter (Signed)
Spoke to [place].  She has already gotten coumadin order from MD.  Pt to continue coumadin 2.5mg  daily until INR check on Thursday 06/04/17.

## 2017-06-04 ENCOUNTER — Ambulatory Visit (INDEPENDENT_AMBULATORY_CARE_PROVIDER_SITE_OTHER): Payer: Medicare Other | Admitting: *Deleted

## 2017-06-04 DIAGNOSIS — L89890 Pressure ulcer of other site, unstageable: Secondary | ICD-10-CM | POA: Diagnosis not present

## 2017-06-04 DIAGNOSIS — B9689 Other specified bacterial agents as the cause of diseases classified elsewhere: Secondary | ICD-10-CM | POA: Diagnosis not present

## 2017-06-04 DIAGNOSIS — Z5181 Encounter for therapeutic drug level monitoring: Secondary | ICD-10-CM | POA: Diagnosis not present

## 2017-06-04 DIAGNOSIS — I5042 Chronic combined systolic (congestive) and diastolic (congestive) heart failure: Secondary | ICD-10-CM | POA: Diagnosis not present

## 2017-06-04 DIAGNOSIS — A419 Sepsis, unspecified organism: Secondary | ICD-10-CM | POA: Diagnosis not present

## 2017-06-04 DIAGNOSIS — I482 Chronic atrial fibrillation, unspecified: Secondary | ICD-10-CM

## 2017-06-04 DIAGNOSIS — T8140XD Infection following a procedure, unspecified, subsequent encounter: Secondary | ICD-10-CM | POA: Diagnosis not present

## 2017-06-04 DIAGNOSIS — L03115 Cellulitis of right lower limb: Secondary | ICD-10-CM | POA: Diagnosis not present

## 2017-06-04 DIAGNOSIS — I110 Hypertensive heart disease with heart failure: Secondary | ICD-10-CM | POA: Diagnosis not present

## 2017-06-04 LAB — POCT INR: INR: 6

## 2017-06-05 DIAGNOSIS — L03115 Cellulitis of right lower limb: Secondary | ICD-10-CM | POA: Diagnosis not present

## 2017-06-05 DIAGNOSIS — T8140XD Infection following a procedure, unspecified, subsequent encounter: Secondary | ICD-10-CM | POA: Diagnosis not present

## 2017-06-05 DIAGNOSIS — B9689 Other specified bacterial agents as the cause of diseases classified elsewhere: Secondary | ICD-10-CM | POA: Diagnosis not present

## 2017-06-05 DIAGNOSIS — I5042 Chronic combined systolic (congestive) and diastolic (congestive) heart failure: Secondary | ICD-10-CM | POA: Diagnosis not present

## 2017-06-05 DIAGNOSIS — I110 Hypertensive heart disease with heart failure: Secondary | ICD-10-CM | POA: Diagnosis not present

## 2017-06-05 DIAGNOSIS — L89890 Pressure ulcer of other site, unstageable: Secondary | ICD-10-CM | POA: Diagnosis not present

## 2017-06-06 DIAGNOSIS — L89890 Pressure ulcer of other site, unstageable: Secondary | ICD-10-CM | POA: Diagnosis not present

## 2017-06-06 DIAGNOSIS — T8140XD Infection following a procedure, unspecified, subsequent encounter: Secondary | ICD-10-CM | POA: Diagnosis not present

## 2017-06-06 DIAGNOSIS — I110 Hypertensive heart disease with heart failure: Secondary | ICD-10-CM | POA: Diagnosis not present

## 2017-06-06 DIAGNOSIS — L03115 Cellulitis of right lower limb: Secondary | ICD-10-CM | POA: Diagnosis not present

## 2017-06-06 DIAGNOSIS — B9689 Other specified bacterial agents as the cause of diseases classified elsewhere: Secondary | ICD-10-CM | POA: Diagnosis not present

## 2017-06-06 DIAGNOSIS — I5042 Chronic combined systolic (congestive) and diastolic (congestive) heart failure: Secondary | ICD-10-CM | POA: Diagnosis not present

## 2017-06-07 DIAGNOSIS — L89890 Pressure ulcer of other site, unstageable: Secondary | ICD-10-CM | POA: Diagnosis not present

## 2017-06-07 DIAGNOSIS — T8140XD Infection following a procedure, unspecified, subsequent encounter: Secondary | ICD-10-CM | POA: Diagnosis not present

## 2017-06-07 DIAGNOSIS — I110 Hypertensive heart disease with heart failure: Secondary | ICD-10-CM | POA: Diagnosis not present

## 2017-06-07 DIAGNOSIS — L03115 Cellulitis of right lower limb: Secondary | ICD-10-CM | POA: Diagnosis not present

## 2017-06-07 DIAGNOSIS — I5042 Chronic combined systolic (congestive) and diastolic (congestive) heart failure: Secondary | ICD-10-CM | POA: Diagnosis not present

## 2017-06-07 DIAGNOSIS — B9689 Other specified bacterial agents as the cause of diseases classified elsewhere: Secondary | ICD-10-CM | POA: Diagnosis not present

## 2017-06-08 ENCOUNTER — Inpatient Hospital Stay (HOSPITAL_COMMUNITY)
Admission: EM | Admit: 2017-06-08 | Discharge: 2017-06-11 | DRG: 292 | Disposition: A | Discharge status: Home Health | Payer: Medicare Other | Attending: Family Medicine | Admitting: Family Medicine

## 2017-06-08 ENCOUNTER — Telehealth: Payer: Self-pay | Admitting: Cardiovascular Disease

## 2017-06-08 ENCOUNTER — Encounter (HOSPITAL_COMMUNITY): Payer: Self-pay | Admitting: Internal Medicine

## 2017-06-08 DIAGNOSIS — I482 Chronic atrial fibrillation, unspecified: Secondary | ICD-10-CM | POA: Diagnosis present

## 2017-06-08 DIAGNOSIS — I5032 Chronic diastolic (congestive) heart failure: Secondary | ICD-10-CM | POA: Diagnosis present

## 2017-06-08 DIAGNOSIS — Z8249 Family history of ischemic heart disease and other diseases of the circulatory system: Secondary | ICD-10-CM

## 2017-06-08 DIAGNOSIS — D75839 Thrombocytosis, unspecified: Secondary | ICD-10-CM | POA: Diagnosis present

## 2017-06-08 DIAGNOSIS — I4581 Long QT syndrome: Secondary | ICD-10-CM | POA: Diagnosis present

## 2017-06-08 DIAGNOSIS — Z66 Do not resuscitate: Secondary | ICD-10-CM | POA: Diagnosis present

## 2017-06-08 DIAGNOSIS — D649 Anemia, unspecified: Secondary | ICD-10-CM | POA: Diagnosis present

## 2017-06-08 DIAGNOSIS — T8140XD Infection following a procedure, unspecified, subsequent encounter: Secondary | ICD-10-CM | POA: Diagnosis not present

## 2017-06-08 DIAGNOSIS — L03115 Cellulitis of right lower limb: Secondary | ICD-10-CM | POA: Diagnosis not present

## 2017-06-08 DIAGNOSIS — I5042 Chronic combined systolic (congestive) and diastolic (congestive) heart failure: Secondary | ICD-10-CM | POA: Diagnosis not present

## 2017-06-08 DIAGNOSIS — I4892 Unspecified atrial flutter: Secondary | ICD-10-CM | POA: Diagnosis present

## 2017-06-08 DIAGNOSIS — Z7901 Long term (current) use of anticoagulants: Secondary | ICD-10-CM

## 2017-06-08 DIAGNOSIS — J449 Chronic obstructive pulmonary disease, unspecified: Secondary | ICD-10-CM | POA: Diagnosis present

## 2017-06-08 DIAGNOSIS — Z9861 Coronary angioplasty status: Secondary | ICD-10-CM

## 2017-06-08 DIAGNOSIS — M79604 Pain in right leg: Secondary | ICD-10-CM | POA: Diagnosis not present

## 2017-06-08 DIAGNOSIS — I442 Atrioventricular block, complete: Secondary | ICD-10-CM | POA: Diagnosis not present

## 2017-06-08 DIAGNOSIS — M7989 Other specified soft tissue disorders: Secondary | ICD-10-CM | POA: Diagnosis present

## 2017-06-08 DIAGNOSIS — Z86718 Personal history of other venous thrombosis and embolism: Secondary | ICD-10-CM

## 2017-06-08 DIAGNOSIS — Z7982 Long term (current) use of aspirin: Secondary | ICD-10-CM

## 2017-06-08 DIAGNOSIS — I110 Hypertensive heart disease with heart failure: Principal | ICD-10-CM | POA: Diagnosis present

## 2017-06-08 DIAGNOSIS — Z79899 Other long term (current) drug therapy: Secondary | ICD-10-CM

## 2017-06-08 DIAGNOSIS — R600 Localized edema: Secondary | ICD-10-CM | POA: Diagnosis not present

## 2017-06-08 DIAGNOSIS — R609 Edema, unspecified: Secondary | ICD-10-CM

## 2017-06-08 DIAGNOSIS — B9689 Other specified bacterial agents as the cause of diseases classified elsewhere: Secondary | ICD-10-CM | POA: Diagnosis not present

## 2017-06-08 DIAGNOSIS — I10 Essential (primary) hypertension: Secondary | ICD-10-CM | POA: Diagnosis present

## 2017-06-08 DIAGNOSIS — K219 Gastro-esophageal reflux disease without esophagitis: Secondary | ICD-10-CM | POA: Diagnosis present

## 2017-06-08 DIAGNOSIS — D473 Essential (hemorrhagic) thrombocythemia: Secondary | ICD-10-CM | POA: Diagnosis present

## 2017-06-08 DIAGNOSIS — H9190 Unspecified hearing loss, unspecified ear: Secondary | ICD-10-CM | POA: Diagnosis present

## 2017-06-08 DIAGNOSIS — L89890 Pressure ulcer of other site, unstageable: Secondary | ICD-10-CM | POA: Diagnosis not present

## 2017-06-08 DIAGNOSIS — E039 Hypothyroidism, unspecified: Secondary | ICD-10-CM | POA: Diagnosis present

## 2017-06-08 DIAGNOSIS — E785 Hyperlipidemia, unspecified: Secondary | ICD-10-CM | POA: Diagnosis present

## 2017-06-08 DIAGNOSIS — Z95828 Presence of other vascular implants and grafts: Secondary | ICD-10-CM

## 2017-06-08 DIAGNOSIS — Z9981 Dependence on supplemental oxygen: Secondary | ICD-10-CM

## 2017-06-08 LAB — COMPREHENSIVE METABOLIC PANEL
ALK PHOS: 87 U/L (ref 38–126)
ALT: 10 U/L — AB (ref 14–54)
AST: 18 U/L (ref 15–41)
Albumin: 2.1 g/dL — ABNORMAL LOW (ref 3.5–5.0)
Anion gap: 11 (ref 5–15)
BUN: 35 mg/dL — ABNORMAL HIGH (ref 6–20)
CALCIUM: 7.9 mg/dL — AB (ref 8.9–10.3)
CO2: 20 mmol/L — AB (ref 22–32)
CREATININE: 1.84 mg/dL — AB (ref 0.44–1.00)
Chloride: 108 mmol/L (ref 101–111)
GFR calc non Af Amer: 26 mL/min — ABNORMAL LOW (ref >60)
GFR, EST AFRICAN AMERICAN: 30 mL/min — AB (ref >60)
Glucose, Bld: 81 mg/dL (ref 65–99)
Potassium: 3.4 mmol/L — ABNORMAL LOW (ref 3.5–5.1)
SODIUM: 139 mmol/L (ref 135–145)
Total Bilirubin: 0.6 mg/dL (ref 0.3–1.2)
Total Protein: 4.9 g/dL — ABNORMAL LOW (ref 6.5–8.1)

## 2017-06-08 LAB — CBC WITH DIFFERENTIAL/PLATELET
BASOS ABS: 0.3 10*3/uL — AB (ref 0.0–0.1)
BASOS PCT: 4 %
EOS PCT: 2 %
Eosinophils Absolute: 0.2 10*3/uL (ref 0.0–0.7)
HCT: 25.4 % — ABNORMAL LOW (ref 36.0–46.0)
HEMOGLOBIN: 7.3 g/dL — AB (ref 12.0–15.0)
Lymphocytes Relative: 9 %
Lymphs Abs: 0.8 10*3/uL (ref 0.7–4.0)
MCH: 23.5 pg — AB (ref 26.0–34.0)
MCHC: 28.7 g/dL — ABNORMAL LOW (ref 30.0–36.0)
MCV: 81.7 fL (ref 78.0–100.0)
MONOS PCT: 2 %
Monocytes Absolute: 0.2 10*3/uL (ref 0.1–1.0)
NEUTROS ABS: 7 10*3/uL (ref 1.7–7.7)
Neutrophils Relative %: 83 %
Platelets: 462 10*3/uL — ABNORMAL HIGH (ref 150–400)
RBC: 3.11 MIL/uL — ABNORMAL LOW (ref 3.87–5.11)
RDW: 22.3 % — ABNORMAL HIGH (ref 11.5–15.5)
WBC: 8.5 10*3/uL (ref 4.0–10.5)

## 2017-06-08 LAB — LACTIC ACID, PLASMA: Lactic Acid, Venous: 1.2 mmol/L (ref 0.5–1.9)

## 2017-06-08 LAB — PROTIME-INR
INR: 2.77
Prothrombin Time: 29 seconds — ABNORMAL HIGH (ref 11.4–15.2)

## 2017-06-08 MED ORDER — HYDROCODONE-ACETAMINOPHEN 5-325 MG PO TABS
1.0000 | ORAL_TABLET | Freq: Once | ORAL | Status: AC
Start: 2017-06-08 — End: 2017-06-08
  Administered 2017-06-08: 1 via ORAL
  Filled 2017-06-08: qty 1

## 2017-06-08 MED ORDER — ACETAMINOPHEN 325 MG PO TABS
650.0000 mg | ORAL_TABLET | Freq: Four times a day (QID) | ORAL | Status: DC | PRN
  Administered 2017-06-09 – 2017-06-10 (×2): 650 mg via ORAL
  Filled 2017-06-08 (×2): qty 2

## 2017-06-08 MED ORDER — DOXYCYCLINE HYCLATE 100 MG PO TABS
100.0000 mg | ORAL_TABLET | Freq: Two times a day (BID) | ORAL | Status: DC
  Administered 2017-06-09 – 2017-06-11 (×6): 100 mg via ORAL
  Filled 2017-06-08 (×6): qty 1

## 2017-06-08 MED ORDER — FUROSEMIDE 40 MG PO TABS: 40.0000 mg | ORAL_TABLET | ORAL | Status: DC

## 2017-06-08 MED ORDER — ORAL CARE MOUTH RINSE
15.0000 mL | Freq: Two times a day (BID) | OROMUCOSAL | Status: DC
  Administered 2017-06-09 – 2017-06-11 (×5): 15 mL via OROMUCOSAL

## 2017-06-08 MED ORDER — OXYCODONE HCL 5 MG PO TABS: 5.0000 mg | ORAL_TABLET | ORAL | Status: DC | PRN

## 2017-06-08 MED ORDER — ONDANSETRON HCL 4 MG PO TABS: 4.0000 mg | ORAL_TABLET | Freq: Four times a day (QID) | ORAL | Status: DC | PRN

## 2017-06-08 MED ORDER — HYDROXYUREA 500 MG PO CAPS
1000.0000 mg | ORAL_CAPSULE | Freq: Every day | ORAL | Status: DC
  Administered 2017-06-09 – 2017-06-11 (×3): 1000 mg via ORAL
  Filled 2017-06-08 (×3): qty 2

## 2017-06-08 MED ORDER — VANCOMYCIN HCL 500 MG IV SOLR
500.0000 mg | INTRAVENOUS | Status: DC
  Administered 2017-06-10 (×2): 500 mg via INTRAVENOUS
  Filled 2017-06-08 (×6): qty 500

## 2017-06-08 MED ORDER — DILTIAZEM HCL ER COATED BEADS 240 MG PO CP24
240.0000 mg | ORAL_CAPSULE | Freq: Every day | ORAL | Status: DC
  Administered 2017-06-09 – 2017-06-11 (×3): 240 mg via ORAL
  Filled 2017-06-08 (×3): qty 1

## 2017-06-08 MED ORDER — LEVOTHYROXINE SODIUM 75 MCG PO TABS
75.0000 ug | ORAL_TABLET | Freq: Every day | ORAL | Status: DC
  Administered 2017-06-09 – 2017-06-11 (×3): 75 ug via ORAL
  Filled 2017-06-08 (×3): qty 1

## 2017-06-08 MED ORDER — PRAVASTATIN SODIUM 20 MG PO TABS
10.0000 mg | ORAL_TABLET | Freq: Every evening | ORAL | Status: DC
  Administered 2017-06-09 – 2017-06-11 (×3): 10 mg via ORAL
  Filled 2017-06-08 (×3): qty 1

## 2017-06-08 MED ORDER — GABAPENTIN 100 MG PO CAPS
100.0000 mg | ORAL_CAPSULE | Freq: Three times a day (TID) | ORAL | Status: DC
  Administered 2017-06-09 – 2017-06-11 (×9): 100 mg via ORAL
  Filled 2017-06-08 (×9): qty 1

## 2017-06-08 MED ORDER — METOLAZONE 2.5 MG PO TABS
2.5000 mg | ORAL_TABLET | ORAL | Status: DC
Start: 2017-06-10 — End: 2017-06-11
  Administered 2017-06-10: 2.5 mg via ORAL
  Filled 2017-06-08: qty 1

## 2017-06-08 MED ORDER — ONDANSETRON HCL 4 MG/2ML IJ SOLN
4.0000 mg | Freq: Four times a day (QID) | INTRAMUSCULAR | Status: DC | PRN
  Administered 2017-06-09: 4 mg via INTRAVENOUS
  Filled 2017-06-08: qty 2

## 2017-06-08 MED ORDER — KETOROLAC TROMETHAMINE 30 MG/ML IJ SOLN
15.0000 mg | Freq: Once | INTRAMUSCULAR | Status: AC
  Administered 2017-06-08: 15 mg via INTRAVENOUS
  Filled 2017-06-08: qty 1

## 2017-06-08 MED ORDER — ASPIRIN EC 81 MG PO TBEC
81.0000 mg | DELAYED_RELEASE_TABLET | Freq: Every day | ORAL | Status: DC
  Administered 2017-06-09 – 2017-06-11 (×3): 81 mg via ORAL
  Filled 2017-06-08 (×3): qty 1

## 2017-06-08 NOTE — ED Triage Notes (Signed)
Right leg swelling and redness.  Had fem pop bypass last month same leg.

## 2017-06-08 NOTE — Telephone Encounter (Signed)
Hold warfarin for one dose, then resume. Repeat INR next week.

## 2017-06-08 NOTE — ED Notes (Signed)
Pt urinated 365mL of yellow clear urine

## 2017-06-08 NOTE — ED Provider Notes (Signed)
[facility] EMERGENCY DEPARTMENT Provider Note   CSN: [id number] Arrival date & time: 06/08/17  1741     History   Chief Complaint Chief Complaint  Patient presents with  . Leg Swelling    HPI Charlotte [name] is a 76 y.o. female.  Patient states that her right leg has been swelling for the last 3 days and she has some  Discomfort.  Patient is taking IV antibiotics at home   The history is provided by the patient.  Leg Pain   This is a new problem. The current episode started more than 2 days ago. The problem occurs constantly. The problem has not changed since onset.Pain location: Right leg. The quality of the pain is described as aching. The pain is at a severity of 3/10. The pain is moderate. Pertinent negatives include full range of motion.    Past Medical History:  Diagnosis Date  . Anxiety   . Arthritis   . Atrial fibrillation, chronic (HCC)    a. on Coumadin for anticoagulation.   . CHF (congestive heart failure) ([place])    combined  . COPD (chronic obstructive pulmonary disease) ([place])    on home O2  . Deaf   . 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 DVT (deep venous thrombosis) ([place])   . Essential hypertension   . GERD (gastroesophageal reflux disease)   . Hyperlipidemia   . Hypothyroidism   . Peripheral neuropathy   . Peripheral vascular disease ([place])   . 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 Ulcer of ankle ([place])    Bilateral 2015  . Varicose veins     Patient Active Problem List   Diagnosis Date Noted  . Pressure ulcer 05/29/2017  . Sepsis ([place]) 05/28/2017  . Hypotension 05/28/2017  . Pressure injury of skin 03/14/2017  . Acute renal failure (ARF) ([place])   . Hyperlipidemia 03/05/2017  . Lower extremity arterial insufficiency, severe, right ([place]) 03/04/2017  . Chronic respiratory failure with hypoxia ([place]) 02/12/2017  . CAP (community acquired pneumonia) 02/12/2017  . Cellulitis 07/03/2016  . Anemia 07/03/2016  . Cough 11/29/2015  . Hypoxemia  11/29/2015  . Community acquired pneumonia 11/29/2015  . CHF (congestive heart failure) ([place]) 11/29/2015  . Acute on chronic respiratory failure with hypoxia ([place])   . Pleural effusion on left   . Acute on chronic diastolic (congestive) heart failure ([place])   . Dyspnea 11/22/2014  . Pleural effusion 11/22/2014  . Hard of hearing 11/22/2014  . Thrombocytosis ([place]) 11/22/2014  . Acute respiratory failure with hypoxemia ([place]) 11/22/2014  . Encounter for therapeutic drug monitoring 08/31/2013  . Aftercare following surgery of the circulatory system, [place] 12/21/2012  . Pain in limb 10/19/2012  . Peripheral vascular disease ([place]) 09/24/2012  . Swollen leg 09/24/2012  . Varicose veins of lower extremities with other complications 84/13/2440  . 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 Arterial occlusion due to stenosis ([place]) 08/20/2012  . Wound infection after surgery 01/07/2012  . Compartment syndrome, nontraumatic, lower extremity 11/29/2011  . Pain of right thigh 11/28/2011  . Hip fracture, right ([place]) 11/15/2011  . Chronic atrial fibrillation ([place]) 11/15/2011  . Anticoagulant long-term use 11/15/2011  . HTN (hypertension) 11/15/2011  . Hypothyroidism 11/15/2011  . Polycythemia vera ([place]) 10/01/2011    Past Surgical History:  Procedure Laterality Date  . ABDOMINAL HYSTERECTOMY    . CORONARY ANGIOPLASTY    . TUBAL LIGATION      OB History  No data available       Home Medications    Prior to Admission medications   Medication Sig Start Date End Date Taking? Authorizing Provider  acetaminophen (TYLENOL) 325 MG tablet Take 2 tablets (650 mg total) by mouth every 6 (six) hours as needed for mild pain or fever. 03/29/17  Yes Mikhail, Velta Addison, DO  aspirin EC 81 MG EC tablet Take 1 tablet (81 mg total) by mouth daily. 06/02/17  Yes Johnson, Clanford L, MD  diltiazem (CARDIZEM CD) 240 MG 24 hr capsule Take 240 mg by mouth daily.   Yes [provider]  doxycycline (VIBRAMYCIN) 100 MG capsule Take 1 capsule (100 mg total) by mouth 2 (two) times daily. 06/01/17 06/11/17 Yes Johnson, Clanford L, MD  furosemide (LASIX) 40 MG tablet Take 1 tablet (40 mg total) by mouth every other day. Patient taking differently: Take 40-80 mg every other day by mouth.  06/01/17  Yes Johnson, Clanford L, MD  gabapentin (NEURONTIN) 100 MG capsule Take 100 mg by mouth 3 (three) times daily.     Yes [provider]  hydroxyurea (HYDREA) 500 MG capsule Take 2 capsules (1,000 mg total) by mouth daily. May take with food to minimize GI side effects. 03/03/17  Yes Twana First, MD  ibuprofen (ADVIL,MOTRIN) 200 MG tablet Take 400 mg every 6 (six) hours as needed by mouth for mild pain or moderate pain.   Yes [provider]  levothyroxine (SYNTHROID, LEVOTHROID) 75 MCG tablet Take 1 tablet (75 mcg total) by mouth daily before breakfast. 06/02/17  Yes Wynetta Emery, Clanford L, MD  metolazone (ZAROXOLYN) 2.5 MG tablet Take 2.5 mg by mouth every Monday, Wednesday, and Friday.   Yes [provider]  pravastatin (PRAVACHOL) 10 MG tablet Take 1 tablet (10 mg total) by mouth daily. Patient taking differently: Take 10 mg every evening by mouth.  06/01/17  Yes Johnson, Clanford L, MD  vancomycin 500 mg in sodium chloride 0.9 % 100 mL Inject 500 mg into the vein daily.   Yes [provider]  amiodarone (PACERONE) 200 MG tablet Take 1 tablet (200 mg total) by mouth daily. Patient not taking: Reported on 06/08/2017 06/01/17   Murlean Iba, MD  feeding supplement, ENSURE ENLIVE, (ENSURE ENLIVE) LIQD Take 237 mLs by mouth 2 (two) times daily between meals. Patient not taking: Reported on 06/08/2017 06/01/17   Irwin Brakeman L, MD  midodrine (PROAMATINE) 5 MG tablet Take 1 tablet (5 mg total) by mouth 3 (three) times daily with meals. Patient not taking: Reported on 06/08/2017 06/01/17   Murlean Iba, MD  Multiple Vitamin  (MULTIVITAMIN WITH MINERALS) TABS tablet Take 1 tablet by mouth daily. Patient not taking: Reported on 06/08/2017 06/02/17   Irwin Brakeman L, MD  potassium chloride SA (K-DUR,KLOR-CON) 20 MEQ tablet Take 2 tablets (40 mEq total) by mouth every other day. Patient not taking: Reported on 06/08/2017 06/01/17   Murlean Iba, MD  warfarin (COUMADIN) 2.5 MG tablet Take 1 tablet (2.5 mg total) by mouth daily at 6 PM. Take one tablet all days except take 2.5mg  on MWF 06/01/17 06/11/17  Murlean Iba, MD    Family History Family History  Problem Relation Age of Onset  . Heart failure Mother   . Heart disease Mother   . Stroke Father   . Heart disease Sister   . Diabetes Son   . Hypertension Sister   . Hypertension Sister   . Stroke Sister     Social  History Social History   Tobacco Use  . Smoking status: Never Smoker  . Smokeless tobacco: Never Used  Substance Use Topics  . Alcohol use: No    Alcohol/week: 0.0 oz  . Drug use: No     Allergies   Patient has no known allergies.   Review of Systems Review of Systems  Constitutional: Negative for appetite change and fatigue.  HENT: Negative for congestion, ear discharge and sinus pressure.   Eyes: Negative for discharge.  Respiratory: Negative for cough.   Cardiovascular: Negative for chest pain.  Gastrointestinal: Negative for abdominal pain and diarrhea.  Genitourinary: Negative for frequency and hematuria.  Musculoskeletal: Negative for back pain.       Swelling right leg  Skin: Negative for rash.  Neurological: Negative for seizures and headaches.  Psychiatric/Behavioral: Negative for hallucinations.     Physical Exam Updated Vital Signs BP 111/60   Pulse 77   Temp (!) 97.5 F (36.4 C) (Oral)   Resp 16   SpO2 94%   Physical Exam  Constitutional: She is oriented to person, place, and time. She appears well-developed.  HENT:  Head: Normocephalic.  Eyes: Conjunctivae and EOM are normal. No scleral  icterus.  Neck: Neck supple. No thyromegaly present.  Cardiovascular: Normal rate and regular rhythm. Exam reveals no gallop and no friction rub.  No murmur heard. Pulmonary/Chest: No stridor. She has no wheezes. She has no rales. She exhibits no tenderness.  Abdominal: She exhibits no distension. There is no tenderness. There is no rebound.  Musculoskeletal: Normal range of motion. She exhibits no edema.  Patient with 3+ edema and redness to right leg.  She has some gangrenous toes that are old  Lymphadenopathy:    She has no cervical adenopathy.  Neurological: She is oriented to person, place, and time. She exhibits normal muscle tone. Coordination normal.  Skin: No rash noted. No erythema.  Psychiatric: She has a normal mood and affect. Her behavior is normal.     ED Treatments / Results  Labs (all labs ordered are listed, but only abnormal results are displayed) Labs Reviewed  CBC WITH DIFFERENTIAL/PLATELET - Abnormal; Notable for the following components:      Result Value   RBC 3.11 (*)    Hemoglobin 7.3 (*)    HCT 25.4 (*)    MCH 23.5 (*)    MCHC 28.7 (*)    RDW 22.3 (*)    Platelets 462 (*)    Basophils Absolute 0.3 (*)    All other components within normal limits  COMPREHENSIVE METABOLIC PANEL - Abnormal; Notable for the following components:   Potassium 3.4 (*)    CO2 20 (*)    BUN 35 (*)    Creatinine, Ser 1.84 (*)    Calcium 7.9 (*)    Total Protein 4.9 (*)    Albumin 2.1 (*)    ALT 10 (*)    GFR calc non Af Amer 26 (*)    GFR calc Af Amer 30 (*)    All other components within normal limits  PROTIME-INR - Abnormal; Notable for the following components:   Prothrombin Time 29.0 (*)    All other components within normal limits  LACTIC ACID, PLASMA    EKG  EKG Interpretation None       Radiology No results found.  Procedures Procedures (including critical care time)  Medications Ordered in ED Medications  HYDROcodone-acetaminophen  (NORCO/VICODIN) 5-325 MG per tablet 1 tablet (1 tablet Oral Given 06/08/17 2126)  Initial Impression / Assessment and Plan / ED Course  I have reviewed the triage vital signs and the nursing notes.  Pertinent labs & imaging results that were available during my care of the patient were reviewed by me and considered in my medical decision making (see chart for details).     Patient with new edema.  Patient will be admitted to medicine for further workup  Final Clinical Impressions(s) / ED Diagnoses   Final diagnoses:  Leg edema    ED Discharge Orders    None       [place] Ferguson, MD 06/08/17 2135

## 2017-06-08 NOTE — H&P (Signed)
History and Physical    Charlotte [name] LXB:[id number] DOB: Dec 24, 1940 DOA: 06/08/2017  PCP: [name], MD   Patient coming from:.  Home  I have personally briefly reviewed patient's old medical records in [place]  Chief Complaint: Right lower extremity edema.  HPI: Charlotte [name] is a 76 y.o. female with medical history significant of osteoarthritis, chronic atrial fibrillation, diastolic heart failure on home oxygen, impaired hearing, history of DVT, essential hypertension, hyperlipidemia, hypothyroidism, peripheral neuropathy, polycythemia vera, history of severe peripheral vascular disease with a history of right femoral endarterectomy and femoral to above knee popliteal bypass in 2014, who was followed in early January this year by vascular surgery and at that time ultrasound revealed that her bypass was occluded. She had no claudication at that time, but has had a history of lower extremity wounds and ulcerations. She was admitted to [facility] from 8/1-26 for septic shock with limb ischemia and cellulitis.  She was discharged to [facility] Readmitted to this facility from 05/28/2017 to 06/01/2017 for metoprolol overdose. She is currently taking doxycycline and is on daily vancomycin for right lower extremity cellulitis.  However, she now returns to the hospital with progressively worse lower extremity edema of the right leg for the past 3 days.  No fever, no chills, sore throat, chest pain, dyspnea, palpitations, orthopnea, abdominal pain, nausea, emesis, diarrhea, melena or hematochezia.  She denies GU symptoms.  No polyuria, no polydipsia or blurred vision.  ED Course: Initial vital signs temperature 97.5F, pulse 83, respirations 18, blood pressure 106/73.  She received a Norco 5 325 tablet.   Workup shows WBC of 8.5 with 83% neutrophils, hemoglobin of 7.3 g/dL and platelets 462.  Sodium 139, potassium 3.4, chloride 108, lactic acid 1.2 and bicarbonate  20 millimol/L.  BUN was 35, creatinine 1.84 and glucose 81 mg/dL.  PT was 29 seconds and INR 2.77.  Review of Systems: As per HPI otherwise 10 point review of systems negative.    Past Medical History:  Diagnosis Date  . Anxiety   . Arthritis   . Atrial fibrillation, chronic (HCC)    a. on Coumadin for anticoagulation.   . CHF (congestive heart failure) ([place])    combined  . COPD (chronic obstructive pulmonary disease) ([place])    on home O2  . Deaf   . Dry gangrene ([place])    R foot  . DVT (deep venous thrombosis) ([place])   . Essential hypertension   . GERD (gastroesophageal reflux disease)   . Hyperlipidemia   . Hypothyroidism   . Peripheral neuropathy   . Peripheral vascular disease ([place])   . 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 Ulcer of ankle ([place])    Bilateral 2015  . Varicose veins     Past Surgical History:  Procedure Laterality Date  . ABDOMINAL HYSTERECTOMY    . CORONARY ANGIOPLASTY    . TUBAL LIGATION       reports that  has never smoked. she has never used smokeless tobacco. She reports that she does not drink alcohol or use drugs.  No Known Allergies  Family History  Problem Relation Age of Onset  . Heart failure Mother   . Heart disease Mother   . Stroke Father   . Heart disease Sister   . Diabetes Son   . Hypertension Sister   . Hypertension Sister   . Stroke Sister     Prior to Admission medications   Medication Sig  Start Date End Date Taking? Authorizing Provider  acetaminophen (TYLENOL) 325 MG tablet Take 2 tablets (650 mg total) by mouth every 6 (six) hours as needed for mild pain or fever. 03/29/17  Yes Mikhail, Velta Addison, DO  aspirin EC 81 MG EC tablet Take 1 tablet (81 mg total) by mouth daily. 06/02/17  Yes Johnson, Clanford L, MD  diltiazem (CARDIZEM CD) 240 MG 24 hr capsule Take 240 mg by mouth daily.   Yes [provider]  doxycycline (VIBRAMYCIN) 100 MG capsule Take 1 capsule (100 mg total) by mouth 2 (two) times daily. 06/01/17  06/11/17 Yes Johnson, Clanford L, MD  furosemide (LASIX) 40 MG tablet Take 1 tablet (40 mg total) by mouth every other day. Patient taking differently: Take 40-80 mg every other day by mouth.  06/01/17  Yes Johnson, Clanford L, MD  gabapentin (NEURONTIN) 100 MG capsule Take 100 mg by mouth 3 (three) times daily.     Yes [provider]  hydroxyurea (HYDREA) 500 MG capsule Take 2 capsules (1,000 mg total) by mouth daily. May take with food to minimize GI side effects. 03/03/17  Yes Twana First, MD  ibuprofen (ADVIL,MOTRIN) 200 MG tablet Take 400 mg every 6 (six) hours as needed by mouth for mild pain or moderate pain.   Yes [provider]  levothyroxine (SYNTHROID, LEVOTHROID) 75 MCG tablet Take 1 tablet (75 mcg total) by mouth daily before breakfast. 06/02/17  Yes Wynetta Emery, Clanford L, MD  metolazone (ZAROXOLYN) 2.5 MG tablet Take 2.5 mg by mouth every Monday, Wednesday, and Friday.   Yes [provider]  pravastatin (PRAVACHOL) 10 MG tablet Take 1 tablet (10 mg total) by mouth daily. Patient taking differently: Take 10 mg every evening by mouth.  06/01/17  Yes Johnson, Clanford L, MD  vancomycin 500 mg in sodium chloride 0.9 % 100 mL Inject 500 mg into the vein daily.   Yes [provider]  amiodarone (PACERONE) 200 MG tablet Take 1 tablet (200 mg total) by mouth daily. Patient not taking: Reported on 06/08/2017 06/01/17   Murlean Iba, MD  feeding supplement, ENSURE ENLIVE, (ENSURE ENLIVE) LIQD Take 237 mLs by mouth 2 (two) times daily between meals. Patient not taking: Reported on 06/08/2017 06/01/17   Irwin Brakeman L, MD  midodrine (PROAMATINE) 5 MG tablet Take 1 tablet (5 mg total) by mouth 3 (three) times daily with meals. Patient not taking: Reported on 06/08/2017 06/01/17   Murlean Iba, MD  Multiple Vitamin (MULTIVITAMIN WITH MINERALS) TABS tablet Take 1 tablet by mouth daily. Patient not taking: Reported on 06/08/2017 06/02/17   Irwin Brakeman L, MD  potassium chloride SA (K-DUR,KLOR-CON) 20 MEQ tablet Take 2 tablets (40 mEq total) by mouth every other day. Patient not taking: Reported on 06/08/2017 06/01/17   Murlean Iba, MD  warfarin (COUMADIN) 2.5 MG tablet Take 1 tablet (2.5 mg total) by mouth daily at 6 PM. Take one tablet all days except take 2.5mg  on MWF 06/01/17 06/11/17  Murlean Iba, MD    Physical Exam: Vitals:   06/08/17 2030 06/08/17 2100 06/08/17 2200 06/08/17 2301  BP: (!) 107/58 111/60 101/68 (!) 112/55  Pulse: 74 77 83 76  Resp: 14 16 14    Temp:    97.6 F (36.4 C)  TempSrc:      SpO2: 96% 94% 98%   Weight:    60.3 kg (133 lb)  Height:    6' (1.829 m)    Constitutional: NAD, calm, comfortable Eyes:  PERRL, lids and conjunctivae normal ENMT: Mucous membranes are moist. Posterior pharynx clear of any exudate or lesions.Normal dentition.  Neck: normal, supple, no masses, no thyromegaly Respiratory: clear to auscultation bilaterally, no wheezing, no crackles. Normal respiratory effort. No accessory muscle use.  Cardiovascular: Regular rate and rhythm, no murmurs / rubs / gallops.  Unilateral right lower extremity edema. 2+ pedal pulses. No carotid bruits.  Abdomen: Soft, no tenderness, no masses palpated. No hepatosplenomegaly. Bowel sounds positive.  Musculoskeletal: no clubbing / cyanosis. Good ROM, no contractures. Normal muscle tone.  Skin: Erythema on right lower extremity.  Positive areas of ecchymosis on extremities.  Positive onychomycosis of toenails.  Please see pictures below. Neurologic: CN 2-12 grossly intact. Sensation intact, DTR normal. Strength 5/5 in all 4.  Psychiatric: Normal judgment and insight. Alert and oriented x 4. Normal mood.      Labs on Admission: I have personally reviewed following labs and imaging studies  CBC: Recent Labs  Lab 06/08/17 1817  WBC 8.5  NEUTROABS 7.0  HGB 7.3*  HCT 25.4*  MCV 81.7  PLT 536*   Basic Metabolic Panel: Recent Labs   Lab 06/08/17 1817  NA 139  K 3.4*  CL 108  CO2 20*  GLUCOSE 81  BUN 35*  CREATININE 1.84*  CALCIUM 7.9*   GFR: Estimated Creatinine Clearance: 25.5 mL/min (A) (by C-G formula based on SCr of 1.84 mg/dL (H)). Liver Function Tests: Recent Labs  Lab 06/08/17 1817  AST 18  ALT 10*  ALKPHOS 87  BILITOT 0.6  PROT 4.9*  ALBUMIN 2.1*   No results for input(s): LIPASE, AMYLASE in the last 168 hours. No results for input(s): AMMONIA in the last 168 hours. Coagulation Profile: Recent Labs  Lab 06/04/17 06/08/17 1817  INR 6.0 2.77   Cardiac Enzymes: No results for input(s): CKTOTAL, CKMB, CKMBINDEX, TROPONINI in the last 168 hours. BNP (last 3 results) No results for input(s): PROBNP in the last 8760 hours. HbA1C: No results for input(s): HGBA1C in the last 72 hours. CBG: No results for input(s): GLUCAP in the last 168 hours. Lipid Profile: No results for input(s): CHOL, HDL, LDLCALC, TRIG, CHOLHDL, LDLDIRECT in the last 72 hours. Thyroid Function Tests: No results for input(s): TSH, T4TOTAL, FREET4, T3FREE, THYROIDAB in the last 72 hours. Anemia Panel: No results for input(s): VITAMINB12, FOLATE, FERRITIN, TIBC, IRON, RETICCTPCT in the last 72 hours. Urine analysis:    Component Value Date/Time   COLORURINE YELLOW 03/09/2017 1648   APPEARANCEUR CLEAR 03/09/2017 1648   LABSPEC 1.021 03/09/2017 1648   PHURINE 5.0 03/09/2017 1648   GLUCOSEU NEGATIVE 03/09/2017 1648   HGBUR MODERATE (A) 03/09/2017 1648   BILIRUBINUR NEGATIVE 03/09/2017 1648   KETONESUR NEGATIVE 03/09/2017 1648   PROTEINUR NEGATIVE 03/09/2017 1648   UROBILINOGEN 1.0 09/14/2012 2337   NITRITE NEGATIVE 03/09/2017 1648   LEUKOCYTESUR NEGATIVE 03/09/2017 1648    Radiological Exams on Admission: No results found.  EKG: Independently reviewed.  Vent. rate 79 BPM PR interval * ms QRS duration 100 ms QT/QTc 437/501 ms P-R-T axes * 102 -23 Atrial flutter with predominant 3:1 AV block Probable right  ventricular hypertrophy Borderline T abnormalities, diffuse leads Prolonged QT interval  Assessment/Plan Principal Problem:   Edema of right lower extremity Observation/telemetry. Continue warfarin per pharmacy. Analgesics as needed. Check arterial and venous Doppler in a.m. Consult vascular surgery if needed. Continue vancomycin 500 mg daily. Continue doxycycline 100 mg p.o. twice daily.  Active Problems:   Chronic atrial fibrillation (HCC) CHA?DS?-VASc Score of  at least 7. Continue Cardizem 240 mg p.o. daily. Continue warfarin per pharmacy.    HTN (hypertension) Continue Cardizem 240 mg p.o. daily Also taking furosemide and metolazone. Monitor blood pressure, renal function and electrolytes.    Hypothyroidism Continue levothyroxine 75 mcg p.o. daily. Monitor TSH as needed.    Thrombocytosis (HCC) Monitor platelets.    Anemia Monitor hematocrit and hemoglobin    Hyperlipidemia Continue pravastatin 10 mg p.o. daily.    GERD (gastroesophageal reflux disease) Protonix 40 mg p.o. daily.     DVT prophylaxis: On warfarin. Code Status: Full code. Family Communication:  Disposition Plan: Observation for pain control and ultrasound evaluation of the lower extremity in AM. Consults called:  Admission status: Observation/telemetry.   Reubin Milan MD [facility] Pager [phone].  If 7PM-7AM, please contact night-coverage [email or web address] Password [facility]  06/08/2017, 11:04 PM   This document was prepared using Dragon voice recognition software and may contain some unintended errors

## 2017-06-09 ENCOUNTER — Observation Stay (HOSPITAL_COMMUNITY): Payer: Medicare Other

## 2017-06-09 ENCOUNTER — Other Ambulatory Visit: Payer: Self-pay

## 2017-06-09 DIAGNOSIS — D473 Essential (hemorrhagic) thrombocythemia: Secondary | ICD-10-CM | POA: Diagnosis not present

## 2017-06-09 DIAGNOSIS — I482 Chronic atrial fibrillation: Secondary | ICD-10-CM

## 2017-06-09 DIAGNOSIS — E785 Hyperlipidemia, unspecified: Secondary | ICD-10-CM | POA: Diagnosis not present

## 2017-06-09 DIAGNOSIS — D649 Anemia, unspecified: Secondary | ICD-10-CM | POA: Diagnosis not present

## 2017-06-09 DIAGNOSIS — E038 Other specified hypothyroidism: Secondary | ICD-10-CM | POA: Diagnosis not present

## 2017-06-09 DIAGNOSIS — Z452 Encounter for adjustment and management of vascular access device: Secondary | ICD-10-CM | POA: Diagnosis not present

## 2017-06-09 DIAGNOSIS — R600 Localized edema: Secondary | ICD-10-CM | POA: Diagnosis not present

## 2017-06-09 DIAGNOSIS — I10 Essential (primary) hypertension: Secondary | ICD-10-CM | POA: Diagnosis not present

## 2017-06-09 DIAGNOSIS — Z95828 Presence of other vascular implants and grafts: Secondary | ICD-10-CM

## 2017-06-09 LAB — HEMOGLOBIN AND HEMATOCRIT, BLOOD
HEMATOCRIT: 25.2 % — AB (ref 36.0–46.0)
Hemoglobin: 7.3 g/dL — ABNORMAL LOW (ref 12.0–15.0)

## 2017-06-09 LAB — CBC WITH DIFFERENTIAL/PLATELET
BASOS ABS: 0.4 10*3/uL — AB (ref 0.0–0.1)
Basophils Relative: 4 %
Eosinophils Absolute: 0.2 10*3/uL (ref 0.0–0.7)
Eosinophils Relative: 3 %
HEMATOCRIT: 24.2 % — AB (ref 36.0–46.0)
HEMOGLOBIN: 7.2 g/dL — AB (ref 12.0–15.0)
LYMPHS PCT: 7 %
Lymphs Abs: 0.6 10*3/uL — ABNORMAL LOW (ref 0.7–4.0)
MCH: 24.5 pg — ABNORMAL LOW (ref 26.0–34.0)
MCHC: 29.8 g/dL — ABNORMAL LOW (ref 30.0–36.0)
MCV: 82.3 fL (ref 78.0–100.0)
Monocytes Absolute: 0.2 10*3/uL (ref 0.1–1.0)
Monocytes Relative: 2 %
NEUTROS ABS: 6.8 10*3/uL (ref 1.7–7.7)
Neutrophils Relative %: 84 %
PLATELETS: 390 10*3/uL (ref 150–400)
RBC: 2.94 MIL/uL — AB (ref 3.87–5.11)
RDW: 21.9 % — ABNORMAL HIGH (ref 11.5–15.5)
WBC: 8.1 10*3/uL (ref 4.0–10.5)

## 2017-06-09 LAB — COMPREHENSIVE METABOLIC PANEL
ALK PHOS: 82 U/L (ref 38–126)
ALT: 9 U/L — AB (ref 14–54)
AST: 14 U/L — AB (ref 15–41)
Albumin: 1.9 g/dL — ABNORMAL LOW (ref 3.5–5.0)
Anion gap: 10 (ref 5–15)
BILIRUBIN TOTAL: 0.8 mg/dL (ref 0.3–1.2)
BUN: 36 mg/dL — AB (ref 6–20)
CHLORIDE: 108 mmol/L (ref 101–111)
CO2: 22 mmol/L (ref 22–32)
CREATININE: 1.72 mg/dL — AB (ref 0.44–1.00)
Calcium: 7.9 mg/dL — ABNORMAL LOW (ref 8.9–10.3)
GFR calc Af Amer: 33 mL/min — ABNORMAL LOW (ref >60)
GFR, EST NON AFRICAN AMERICAN: 28 mL/min — AB (ref >60)
Glucose, Bld: 83 mg/dL (ref 65–99)
Potassium: 3.4 mmol/L — ABNORMAL LOW (ref 3.5–5.1)
Sodium: 140 mmol/L (ref 135–145)
Total Protein: 4.6 g/dL — ABNORMAL LOW (ref 6.5–8.1)

## 2017-06-09 LAB — PROTIME-INR
INR: 2.13
PROTHROMBIN TIME: 23.7 s — AB (ref 11.4–15.2)

## 2017-06-09 LAB — PREPARE RBC (CROSSMATCH)

## 2017-06-09 MED ORDER — SODIUM CHLORIDE 0.9 % IV SOLN
Freq: Once | INTRAVENOUS | Status: AC
  Administered 2017-06-09: 18:00:00 via INTRAVENOUS

## 2017-06-09 MED ORDER — PANTOPRAZOLE SODIUM 40 MG PO TBEC
40.0000 mg | DELAYED_RELEASE_TABLET | Freq: Every day | ORAL | Status: DC
  Administered 2017-06-09 – 2017-06-11 (×3): 40 mg via ORAL
  Filled 2017-06-09 (×3): qty 1

## 2017-06-09 MED ORDER — FUROSEMIDE 40 MG PO TABS
40.0000 mg | ORAL_TABLET | Freq: Every day | ORAL | Status: DC
  Administered 2017-06-09 – 2017-06-11 (×3): 40 mg via ORAL
  Filled 2017-06-09 (×3): qty 1

## 2017-06-09 NOTE — Progress Notes (Signed)
ANTICOAGULATION CONSULT NOTE - Initial Consult  Pharmacy Consult for Heparin when INR <2 Indication: atrial fibrillation  No Known Allergies  Patient Measurements: Height: 6' (182.9 cm) Weight: 162 lb 3.2 oz (73.6 kg) IBW/kg (Calculated) : 73.1 Heparin Dosing Weight: 73.6 kg  Vital Signs: Temp: 97.8 F (36.6 C) (11/06 1643) Temp Source: Oral (11/06 1643) BP: 97/46 (11/06 1643) Pulse Rate: 77 (11/06 1643)  Labs: Recent Labs    06/08/17 1817 06/09/17 0610 06/09/17 1004 06/09/17 1147  HGB 7.3* 7.2*  --  7.3*  HCT 25.4* 24.2*  --  25.2*  PLT 462* 390  --   --   LABPROT 29.0*  --  23.7*  --   INR 2.77  --  2.13  --   CREATININE 1.84* 1.72*  --   --     Estimated Creatinine Clearance: 33.1 mL/min (A) (by C-G formula based on SCr of 1.72 mg/dL (H)).   Medical History: Past Medical History:  Diagnosis Date  . Anxiety   . Arthritis   . Atrial fibrillation, chronic (HCC)    a. on Coumadin for anticoagulation.   . CHF (congestive heart failure) ([place])    combined  . COPD (chronic obstructive pulmonary disease) ([place])    on home O2  . Deaf   . Dry gangrene ([place])    R foot  . DVT (deep venous thrombosis) ([place])   . Essential hypertension   . GERD (gastroesophageal reflux disease)   . Hyperlipidemia   . Hypothyroidism   . Peripheral neuropathy   . Peripheral vascular disease ([place])   . 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 Ulcer of ankle ([place])    Bilateral 2015  . Varicose veins    Assessment:  76 yr old female on Coumadin prior to admission for atrial fibrillation.  INR therapeutic (2.77) on admission to [facility] on 06/08/17. No Coumadin given on 11/5 and transferred to [facility] this afternoon. Vascular surgery consulted.    INR 2.13 today.  Coumadin on hold. To begin IV heparin when INR <2.   Hemoglobin 7.3, to receive 1 unit PRBCs.   On Vancomycin 500 mg IV q24hrs for right lower extremity cellulitis, with planned stop date on 06/12/17 and oral  Doxycycline x 10 days for pneumonia, per 06/01/17 discharge summary.  Goal of Therapy:  Heparin level 0.3-0.7 units/ml Monitor platelets by anticoagulation protocol: Yes   Plan:   No heparin today, with therapeutic INR.  Daily PT/INR.  Begin IV heparin when INR < 2.  Follow up Vascular surgery consult and plans.  Follow up antibiotic plans.   Vancomycin and Doxycycline previously planned to continue thru 06/12/17.   Arty Baumgartner, [place] Pager: [phone] 06/09/2017,8:46 PM

## 2017-06-09 NOTE — Progress Notes (Signed)
Pt transported via [facility] to [place] report to [facility]. Sent pt belongings.

## 2017-06-09 NOTE — Plan of Care (Signed)
Pt c/o pain in R foot d/t swelling and previous surgery. Pt given ice pack and leg elevated on pillows. Pt educated on pain mgmt as well as medications available. Will continue to monitor pt

## 2017-06-09 NOTE — Progress Notes (Addendum)
Pts multiple wounds cleansed. Foam dressing placed on sacral area and both heels. Pt educated on needing to be turned q2h to prevent further skin breakdown. Pt states she has not gotten out of bed in 3 months since her surgery on her R leg; states she sleeps on couch and her husband and son help her. Pt states that someone from home health comes but she doesn't recall who or from what company. Wounds documented, will continue to monitor

## 2017-06-09 NOTE — Progress Notes (Signed)
PROGRESS NOTE    Charlotte [name]  FWY:[id number] DOB: Jan 07, 1941 DOA: 06/08/2017 PCP: [name], MD    Brief Narrative:  Charlotte [name] is a 76 y.o. female with medical history significant of osteoarthritis, chronic atrial fibrillation, diastolic heart failure on home oxygen, impaired hearing, history of DVT, essential hypertension,hyperlipidemia,hypothyroidism, peripheral neuropathy, polycythemia vera, history of severe peripheral vascular disease with a history of right femoral endarterectomy and femoral to above knee popliteal bypass in 2014, who was followed in early January this year by vascular surgery and at that time ultrasound revealed that her bypass was occluded. She had no claudication at that time, but has had a history of lower extremity wounds and ulcerations. She was admitted to [facility] from 8/1-26 for septic shock with limb ischemia and cellulitis.  She was discharged to [facility]). Readmitted to this facility from 05/28/2017 to 06/01/2017 for metoprolol overdose. She is currently taking doxycycline and is on daily vancomycin for right lower extremity cellulitis.  However, she now returns to the hospital with progressively worse lower extremity edema of the right leg for the past 3 days.  No fever, no chills, sore throat, chest pain, dyspnea, palpitations, orthopnea, abdominal pain, nausea, emesis, diarrhea, melena or hematochezia.  She denies GU symptoms.  No polyuria, no polydipsia or blurred vision.  ED Course: Initial vital signs temperature 97.11F, pulse 83, respirations 18, blood pressure 106/73.  She received a Norco 5 325 tablet.   Workup shows WBC of 8.5 with 83% neutrophils, hemoglobin of 7.3 g/dL and platelets 462.  Sodium 139, potassium 3.4, chloride 108, lactic acid 1.2 and bicarbonate 20 millimol/L.  BUN was 35, creatinine 1.84 and glucose 81 mg/dL.  PT was 29 seconds and INR 2.77.     Assessment & Plan:   Principal Problem:   Edema of right  lower extremity Active Problems:   Chronic atrial fibrillation (HCC)   HTN (hypertension)   Hypothyroidism   Thrombocytosis (HCC)   Anemia   Hyperlipidemia   GERD (gastroesophageal reflux disease)   Edema of right lower extremity Transfer to [facility] for evaluation by Vascular surgery Hold warfarin and start heparin Analgesics as needed. Arterial and venous Doppler in a.m. Vascular surgery, Dr. Scot Dock telephoned- will need to see patient in consultation at [facility] Continue vancomycin 500 mg daily. Continue doxycycline 100 mg p.o. twice daily.    Chronic atrial fibrillation (HCC) CHA?DS?-VASc Scoreof at least 7. Continue Cardizem 240 mg p.o. daily. Stop coumadin Heparin    HTN (hypertension) Continue Cardizem 240 mg p.o. daily Also taking furosemide and metolazone. BP well controlled    Hypothyroidism Continue levothyroxine 75 mcg p.o. daily. Last TSH of 11.084 on 10/25- will need repeat TSH in 4 weeks    Thrombocytosis (HCC) Monitor platelets.    Anemia Type and screen patient Will order blood transfusion as patient H/H lower than 10 days ago No signs of bleeding at this time Monitor for signs of bleeding    Hyperlipidemia Continue pravastatin 10 mg p.o. daily.    GERD (gastroesophageal reflux disease) Protonix 40 mg p.o. daily.    DVT prophylaxis: Warfarin Code Status: DNR Family Communication: Husband bedside Disposition Plan: unclear   Consultants:   vascular  Procedures:   none  Antimicrobials:   Vancomycin  Doxycycline    Subjective: Patient says her right leg is very painful.  She is open to going to [facility] for evaluation by vascular.  Husband says she has appointment with Dr. [name] on November 9.  No hematemesis or  hematochezia.  Objective: Vitals:   06/08/17 2100 06/08/17 2200 06/08/17 2301 06/09/17 0439  BP: 111/60 101/68 (!) 112/55 120/65  Pulse: 77 83 76 73  Resp: 16 14  16   Temp:   97.6 F (36.4 C) (!) 97.5 F (36.4 C)   TempSrc:    Oral  SpO2: 94% 98%  93%  Weight:   71.1 kg (156 lb 12.8 oz)   Height:   6' (1.829 m)     Intake/Output Summary (Last 24 hours) at 06/09/2017 1032 Last data filed at 06/09/2017 0438 Gross per 24 hour  Intake 200 ml  Output 350 ml  Net -150 ml   Filed Weights   06/08/17 2301  Weight: 71.1 kg (156 lb 12.8 oz)    Examination:  General exam: Appears calm and comfortable  Respiratory system: Clear to auscultation. Respiratory effort normal. Cardiovascular system: S1 & S2 heard, RRR. No JVD, murmurs, rubs, gallops or clicks. Significant edema of the right lower extremity Gastrointestinal system: Abdomen is nondistended, soft and nontender. No organomegaly or masses felt. Normal bowel sounds heard. Central nervous system: Alert and oriented. No focal neurological deficits. Extremities: Symmetric 5 x 5 power. Skin: stasis changes and erythema of the right lower extremity, wounds bilateral lower extremities (see pictures) Psychiatry: questionable insight. Mood & affect appropriate.           Data Reviewed: I have personally reviewed following labs and imaging studies  CBC: Recent Labs  Lab 06/08/17 1817 06/09/17 0610  WBC 8.5 8.1  NEUTROABS 7.0 6.8  HGB 7.3* 7.2*  HCT 25.4* 24.2*  MCV 81.7 82.3  PLT 462* 622   Basic Metabolic Panel: Recent Labs  Lab 06/08/17 1817 06/09/17 0610  NA 139 140  K 3.4* 3.4*  CL 108 108  CO2 20* 22  GLUCOSE 81 83  BUN 35* 36*  CREATININE 1.84* 1.72*  CALCIUM 7.9* 7.9*   GFR: Estimated Creatinine Clearance: 32.2 mL/min (A) (by C-G formula based on SCr of 1.72 mg/dL (H)). Liver Function Tests: Recent Labs  Lab 06/08/17 1817 06/09/17 0610  AST 18 14*  ALT 10* 9*  ALKPHOS 87 82  BILITOT 0.6 0.8  PROT 4.9* 4.6*  ALBUMIN 2.1* 1.9*   No results for input(s): LIPASE, AMYLASE in the last 168 hours. No results for input(s): AMMONIA in the last 168 hours. Coagulation Profile: Recent Labs  Lab 06/04/17 06/08/17 1817   INR 6.0 2.77   Cardiac Enzymes: No results for input(s): CKTOTAL, CKMB, CKMBINDEX, TROPONINI in the last 168 hours. BNP (last 3 results) No results for input(s): PROBNP in the last 8760 hours. HbA1C: No results for input(s): HGBA1C in the last 72 hours. CBG: No results for input(s): GLUCAP in the last 168 hours. Lipid Profile: No results for input(s): CHOL, HDL, LDLCALC, TRIG, CHOLHDL, LDLDIRECT in the last 72 hours. Thyroid Function Tests: No results for input(s): TSH, T4TOTAL, FREET4, T3FREE, THYROIDAB in the last 72 hours. Anemia Panel: No results for input(s): VITAMINB12, FOLATE, FERRITIN, TIBC, IRON, RETICCTPCT in the last 72 hours. Sepsis Labs: Recent Labs  Lab 06/08/17 1821  LATICACIDVEN 1.2    No results found for this or any previous visit (from the past 240 hour(s)).       Radiology Studies: No results found.      Scheduled Meds: . aspirin EC  81 mg Oral Daily  . diltiazem  240 mg Oral Daily  . doxycycline  100 mg Oral BID  . furosemide  40 mg Oral Daily  . gabapentin  100 mg Oral TID  . hydroxyurea  1,000 mg Oral Daily  . levothyroxine  75 mcg Oral QAC breakfast  . mouth rinse  15 mL Mouth Rinse BID  . [START ON 06/10/2017] metolazone  2.5 mg Oral Q M,W,F  . pantoprazole  40 mg Oral Daily  . pravastatin  10 mg Oral QPM   Continuous Infusions: . vancomycin (VANCOCIN) 500 mg/177mL IVPB       LOS: 0 days    Time spent: 40 minutes    Loretha Stapler, MD [facility] Pager [phone]  If 7PM-7AM, please contact night-coverage [email or web address] Password [email or web address] 06/09/2017, 10:32 AM

## 2017-06-09 NOTE — Progress Notes (Signed)
Called and spoke with O.Olofin RN requested to get chest x-ray order to verify central line placement. Upon central line assessment, this nurse noted that neither stitches were secure and that drsg was loose. Drsg change performed and statlock placed to secure line at 2 cm. Fran Lowes, RN VAST

## 2017-06-09 NOTE — Progress Notes (Signed)
Unable to palpitate dorsal pedal pulse in RLU due to +2 pitting edema

## 2017-06-09 NOTE — Consult Note (Signed)
Patient name: Charlotte [name] MRN: [id number] DOB: Nov 21, 1940 Sex: female   REASON FOR CONSULT:    Leg pain and swelling.  HPI:   Charlotte [name] is a pleasant 76 y.o. female, who underwent a right femoral to tibial peroneal trunk bypass with a vein graft by Dr. [name] on 03/11/2017.  The patient had previously undergone right femoral endarterectomy and a right femoral to above-knee popliteal artery bypass by Dr. Kellie Simmering in 2014.  When the patient was seen by Dr. Donnetta Hutching on 08/19/2016, the ABI on the right was 71% and 100% on the left.  Duplex showed that the right femoropopliteal bypass graft was occluded.  The patient subsequently developed rest pain on the right leg which is why she underwent a redo bypass.  The patient was last seen in follow-up in our office on 05/15/2017.  At the time of her last visit, she did have some persistent dry gangrene on the foot as well as a foot drop on the right.    The patient was seen at [facility] and transferred here for further evaluation of right leg swelling and right leg pain.  Patient denies any fever.  The patient has had the gradual onset of right lower extremity swelling.  Past Medical History:  Diagnosis Date  . Anxiety   . Arthritis   . Atrial fibrillation, chronic (HCC)    a. on Coumadin for anticoagulation.   . CHF (congestive heart failure) ([place])    combined  . COPD (chronic obstructive pulmonary disease) ([place])    on home O2  . Deaf   . 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 DVT (deep venous thrombosis) ([place])   . Essential hypertension   . GERD (gastroesophageal reflux disease)   . Hyperlipidemia   . Hypothyroidism   . Peripheral neuropathy   . Peripheral vascular disease ([place])   . 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 Ulcer of ankle ([place])    Bilateral 2015  . Varicose veins     Family History  Problem Relation Age of Onset  . Heart failure Mother   . Heart disease Mother   . Stroke Father   . Heart disease Sister     . Diabetes Son   . Hypertension Sister   . Hypertension Sister   . Stroke Sister     SOCIAL HISTORY: Social History   Socioeconomic History  . Marital status: Married    Spouse name: Not on file  . Number of children: Not on file  . Years of education: Not on file  . Highest education level: Not on file  Social Needs  . Financial resource strain: Not on file  . Food insecurity - worry: Not on file  . Food insecurity - inability: Not on file  . Transportation needs - medical: Not on file  . Transportation needs - non-medical: Not on file  Occupational History  . Not on file  Tobacco Use  . Smoking status: Never Smoker  . Smokeless tobacco: Never Used  Substance and Sexual Activity  . Alcohol use: No    Alcohol/week: 0.0 oz  . Drug use: No  . Sexual activity: Not Currently  Other Topics Concern  . Not on file  Social History Narrative  . Not on file    No Known Allergies  Current Facility-Administered Medications  Medication Dose Route Frequency Provider Last Rate Last Dose  . acetaminophen (TYLENOL) tablet 650 mg  650 mg Oral Q6H PRN Tennis Must  Audelia Hives, MD   650 mg at 06/09/17 1015  . aspirin EC tablet 81 mg  81 mg Oral Daily Reubin Milan, MD   81 mg at 06/09/17 1610  . diltiazem (CARDIZEM CD) 24 hr capsule 240 mg  240 mg Oral Daily Reubin Milan, MD   240 mg at 06/09/17 9604  . doxycycline (VIBRA-TABS) tablet 100 mg  100 mg Oral BID Reubin Milan, MD   100 mg at 06/09/17 5409  . furosemide (LASIX) tablet 40 mg  40 mg Oral Daily Eber [name], MD   40 mg at 06/09/17 8119  . gabapentin (NEURONTIN) capsule 100 mg  100 mg Oral TID Reubin Milan, MD   100 mg at 06/09/17 1806  . hydroxyurea (HYDREA) capsule 1,000 mg  1,000 mg Oral Daily Reubin Milan, MD   1,000 mg at 06/09/17 1204  . levothyroxine (SYNTHROID, LEVOTHROID) tablet 75 mcg  75 mcg Oral QAC breakfast Reubin Milan, MD   75 mcg at 06/09/17 1478  . MEDLINE mouth  rinse  15 mL Mouth Rinse BID Reubin Milan, MD   15 mL at 06/09/17 2956  . [START ON 06/10/2017] metolazone (ZAROXOLYN) tablet 2.5 mg  2.5 mg Oral Q M,W,F Reubin Milan, MD      . ondansetron [facility]) tablet 4 mg  4 mg Oral Q6H PRN Reubin Milan, MD       Or  . ondansetron [facility]) injection 4 mg  4 mg Intravenous Q6H PRN Reubin Milan, MD   4 mg at 06/09/17 0435  . oxyCODONE (Oxy IR/ROXICODONE) immediate release tablet 5 mg  5 mg Oral Q4H PRN Reubin Milan, MD      . pantoprazole (PROTONIX) EC tablet 40 mg  40 mg Oral Daily Reubin Milan, MD   40 mg at 06/09/17 2130  . pravastatin (PRAVACHOL) tablet 10 mg  10 mg Oral QPM Reubin Milan, MD   10 mg at 06/09/17 1806  . vancomycin (VANCOCIN) 500 mg in sodium chloride 0.9 % 100 mL IVPB  500 mg Intravenous Q24H Reubin Milan, MD        REVIEW OF SYSTEMS:  [X]  denotes positive finding, [ ]  denotes negative finding Cardiac  Comments:  Chest pain or chest pressure:    Shortness of breath upon exertion:    Short of breath when lying flat:    Irregular heart rhythm:        Vascular    Pain in calf, thigh, or hip brought on by ambulation:    Pain in feet at night that wakes you up from your sleep:     Blood clot in your veins:    Leg swelling:  X       Pulmonary    Oxygen at home:    Productive cough:     Wheezing:         Neurologic    Sudden weakness in arms or legs:     Sudden numbness in arms or legs:     Sudden onset of difficulty speaking or slurred speech:    Temporary loss of vision in one eye:     Problems with dizziness:         Gastrointestinal    Blood in stool:     Vomited blood:         Genitourinary    Burning when urinating:     Blood in urine:        Psychiatric  Major depression:         Hematologic    Bleeding problems:    Problems with blood clotting too easily:        Skin    Rashes or ulcers: X       Constitutional    Fever or chills:     PHYSICAL  EXAM:   Vitals:   06/08/17 2200 06/08/17 2301 06/09/17 0439 06/09/17 1643  BP: 101/68 (!) 112/55 120/65 (!) 97/46  Pulse: 83 76 73 77  Resp: 14  16 18   Temp:  97.6 F (36.4 C) (!) 97.5 F (36.4 C) 97.8 F (36.6 C)  TempSrc:   Oral Oral  SpO2: 98%  93% 96%  Weight:  156 lb 12.8 oz (71.1 kg)  162 lb 3.2 oz (73.6 kg)  Height:  6' (1.829 m)  6' (1.829 m)    GENERAL: The patient is a well-nourished female, in no acute distress. The vital signs are documented above. CARDIAC: There is a regular rate and rhythm.  VASCULAR: The patient has a right femoral pulse and a brisk posterior tibial signal with the Doppler on the right.  The patient also has a dorsalis pedis signal with the Doppler. There is significant right lower extremity swelling. PULMONARY: There is good air exchange bilaterally without wheezing or rales. ABDOMEN: Soft and non-tender with normal pitched bowel sounds.  MUSCULOSKELETAL: There are no major deformities or cyanosis. NEUROLOGIC: No focal weakness or paresthesias are detected. SKIN: The incision below the knee has 2 open areas which are being packed. There is dry gangrene involving the lateral aspect of the right foot and also the lateral aspect of the fourth toe.  There is no significant drainage.  There is mild erythema. PSYCHIATRIC: The patient has a normal affect.  DATA:    Venous duplex scan has been ordered for the morning.  MEDICAL ISSUES:   STATUS POST RIGHT FEMORAL TO TIBIAL PERONEAL TRUNK BYPASS: Based on her exam, I think her bypass graft is patent.  She has a brisk posterior tibial signal with the Doppler and also a dorsalis pedis signal.  She has significant right lower extremity swelling and I agree with the plans for a venous duplex scan.  I think this is most likely simply related to her bypass however.  Her Coumadin is therapeutic with an INR of 2.77.  I will notify Dr. [name] of the patient's admission.  I think the wounds on the right foot can be  managed as an outpatient if she can go on po antibiotics.  [name] [facility] [phone]

## 2017-06-09 NOTE — Care Management Obs Status (Signed)
[place] NOTIFICATION   Patient Details  Name: Charlotte [name] MRN: [id number] Date of Birth: [birth year]   Medicare Observation Status Notification Given:  Yes    [name], Chauncey Reading, RN 06/09/2017, 10:40 AM

## 2017-06-09 NOTE — Plan of Care (Signed)
progressing 

## 2017-06-10 ENCOUNTER — Observation Stay (HOSPITAL_BASED_OUTPATIENT_CLINIC_OR_DEPARTMENT_OTHER): Payer: Medicare Other

## 2017-06-10 DIAGNOSIS — I5032 Chronic diastolic (congestive) heart failure: Secondary | ICD-10-CM | POA: Diagnosis present

## 2017-06-10 DIAGNOSIS — Z66 Do not resuscitate: Secondary | ICD-10-CM | POA: Diagnosis present

## 2017-06-10 DIAGNOSIS — Z7982 Long term (current) use of aspirin: Secondary | ICD-10-CM | POA: Diagnosis not present

## 2017-06-10 DIAGNOSIS — J449 Chronic obstructive pulmonary disease, unspecified: Secondary | ICD-10-CM | POA: Diagnosis present

## 2017-06-10 DIAGNOSIS — I999 Unspecified disorder of circulatory system: Secondary | ICD-10-CM | POA: Diagnosis not present

## 2017-06-10 DIAGNOSIS — I4581 Long QT syndrome: Secondary | ICD-10-CM | POA: Diagnosis present

## 2017-06-10 DIAGNOSIS — I744 Embolism and thrombosis of arteries of extremities, unspecified: Secondary | ICD-10-CM | POA: Diagnosis not present

## 2017-06-10 DIAGNOSIS — Z7901 Long term (current) use of anticoagulants: Secondary | ICD-10-CM | POA: Diagnosis not present

## 2017-06-10 DIAGNOSIS — D649 Anemia, unspecified: Secondary | ICD-10-CM | POA: Diagnosis present

## 2017-06-10 DIAGNOSIS — M7989 Other specified soft tissue disorders: Secondary | ICD-10-CM | POA: Diagnosis not present

## 2017-06-10 DIAGNOSIS — Z8249 Family history of ischemic heart disease and other diseases of the circulatory system: Secondary | ICD-10-CM | POA: Diagnosis not present

## 2017-06-10 DIAGNOSIS — I4892 Unspecified atrial flutter: Secondary | ICD-10-CM | POA: Diagnosis present

## 2017-06-10 DIAGNOSIS — Z79899 Other long term (current) drug therapy: Secondary | ICD-10-CM | POA: Diagnosis not present

## 2017-06-10 DIAGNOSIS — R4182 Altered mental status, unspecified: Secondary | ICD-10-CM | POA: Diagnosis not present

## 2017-06-10 DIAGNOSIS — R600 Localized edema: Secondary | ICD-10-CM

## 2017-06-10 DIAGNOSIS — E039 Hypothyroidism, unspecified: Secondary | ICD-10-CM | POA: Diagnosis present

## 2017-06-10 DIAGNOSIS — Z9861 Coronary angioplasty status: Secondary | ICD-10-CM | POA: Diagnosis not present

## 2017-06-10 DIAGNOSIS — I110 Hypertensive heart disease with heart failure: Secondary | ICD-10-CM | POA: Diagnosis present

## 2017-06-10 DIAGNOSIS — I482 Chronic atrial fibrillation: Secondary | ICD-10-CM | POA: Diagnosis present

## 2017-06-10 DIAGNOSIS — E785 Hyperlipidemia, unspecified: Secondary | ICD-10-CM | POA: Diagnosis present

## 2017-06-10 DIAGNOSIS — K219 Gastro-esophageal reflux disease without esophagitis: Secondary | ICD-10-CM | POA: Diagnosis present

## 2017-06-10 DIAGNOSIS — I442 Atrioventricular block, complete: Secondary | ICD-10-CM | POA: Diagnosis present

## 2017-06-10 DIAGNOSIS — Z86718 Personal history of other venous thrombosis and embolism: Secondary | ICD-10-CM | POA: Diagnosis not present

## 2017-06-10 DIAGNOSIS — H9190 Unspecified hearing loss, unspecified ear: Secondary | ICD-10-CM | POA: Diagnosis present

## 2017-06-10 DIAGNOSIS — Z9981 Dependence on supplemental oxygen: Secondary | ICD-10-CM | POA: Diagnosis not present

## 2017-06-10 LAB — HEPARIN LEVEL (UNFRACTIONATED): Heparin Unfractionated: 0.13 IU/mL — ABNORMAL LOW (ref 0.30–0.70)

## 2017-06-10 LAB — HEMOGLOBIN AND HEMATOCRIT, BLOOD
HCT: 27.2 % — ABNORMAL LOW (ref 36.0–46.0)
Hemoglobin: 8.1 g/dL — ABNORMAL LOW (ref 12.0–15.0)

## 2017-06-10 LAB — PROTIME-INR
INR: 1.71
PROTHROMBIN TIME: 19.9 s — AB (ref 11.4–15.2)

## 2017-06-10 MED ORDER — HEPARIN (PORCINE) IN NACL 100-0.45 UNIT/ML-% IJ SOLN
1300.0000 [IU]/h | INTRAMUSCULAR | Status: DC
  Administered 2017-06-10: 1100 [IU]/h via INTRAVENOUS
  Administered 2017-06-11: 1300 [IU]/h via INTRAVENOUS
  Filled 2017-06-10 (×3): qty 250

## 2017-06-10 MED ORDER — WARFARIN SODIUM 5 MG PO TABS
5.0000 mg | ORAL_TABLET | Freq: Once | ORAL | Status: AC
  Administered 2017-06-10: 5 mg via ORAL
  Filled 2017-06-10: qty 1

## 2017-06-10 MED ORDER — WARFARIN - PHARMACIST DOSING INPATIENT
Freq: Every day | Status: DC
  Administered 2017-06-10 – 2017-06-11 (×2)

## 2017-06-10 MED ORDER — SODIUM CHLORIDE 0.9% FLUSH
10.0000 mL | INTRAVENOUS | Status: DC | PRN
  Administered 2017-06-11: 20 mL
  Filled 2017-06-10: qty 40

## 2017-06-10 NOTE — H&P (Signed)
ANTICOAGULATION CONSULT NOTE - Follow Up Consult  Pharmacy Consult for Heparin and coumadin  Indication: atrial fibrillation and hx of DVT  No Known Allergies  Patient Measurements: Height: 6' (182.9 cm) Weight: 162 lb 3.2 oz (73.6 kg) IBW/kg (Calculated) : 73.1 Heparin Dosing Weight: 73 kg  Vital Signs: Temp: 97.9 F (36.6 C) (11/07 1322) Temp Source: Oral (11/07 1322) BP: 112/59 (11/07 1322) Pulse Rate: 70 (11/07 1322)  Labs: Recent Labs    06/08/17 1817 06/09/17 0610 06/09/17 1004 06/09/17 1147 06/10/17 0857 06/10/17 2042  HGB 7.3* 7.2*  --  7.3* 8.1*  --   HCT 25.4* 24.2*  --  25.2* 27.2*  --   PLT 462* 390  --   --   --   --   LABPROT 29.0*  --  23.7*  --  19.9*  --   INR 2.77  --  2.13  --  1.71  --   HEPARINUNFRC  --   --   --   --   --  0.13*  CREATININE 1.84* 1.72*  --   --   --   --     Estimated Creatinine Clearance: 33.1 mL/min (A) (by C-G formula based on SCr of 1.72 mg/dL (H)).   Medications:  Scheduled:  . aspirin EC  81 mg Oral Daily  . diltiazem  240 mg Oral Daily  . doxycycline  100 mg Oral BID  . furosemide  40 mg Oral Daily  . gabapentin  100 mg Oral TID  . hydroxyurea  1,000 mg Oral Daily  . levothyroxine  75 mcg Oral QAC breakfast  . mouth rinse  15 mL Mouth Rinse BID  . metolazone  2.5 mg Oral Q M,W,F  . pantoprazole  40 mg Oral Daily  . pravastatin  10 mg Oral QPM  . Warfarin - Pharmacist Dosing Inpatient   Does not apply q1800   Infusions:  . heparin 1,300 Units/hr (06/10/17 2122)  . vancomycin (VANCOCIN) 500 mg/143mL IVPB Stopped (06/10/17 0147)    Assessment: 76 yo F presented to [facility] with [name].  Pt has hx of recent LE revasc procedure. Transferred to [facility] for VVS eval. No immediate interventions planned and remains on heparin gtt with warfarin being restarted on 11/7.   Initial heparin level is subtherapeutic at 0.13. No issues with infusion.   Goal of Therapy:  Heparin level 0.3-0.7 units/ml Monitor platelets by  anticoagulation protocol: Yes   Plan:  1. Increase heparin infusion to 1300 units/hr  2. Repeat heparin level with am labs   Vincenza Hews, PharmD, BCPS 06/10/2017, 9:30 PM

## 2017-06-10 NOTE — Progress Notes (Signed)
PROGRESS NOTE    Charlotte [name]  URK:[id number] DOB: [birth year] DOA: 06/08/2017 PCP: [name], MD    Brief Narrative:  Charlotte [name] is a 76 y.o. female with medical history significant of osteoarthritis, chronic atrial fibrillation, diastolic heart failure on home oxygen, impaired hearing, history of DVT, essential hypertension,hyperlipidemia,hypothyroidism, peripheral neuropathy, polycythemia vera, history of severe peripheral vascular disease with a history of right femoral endarterectomy and femoral to above knee popliteal bypass in 2014, who was followed in early January this year by vascular surgery and at that time ultrasound revealed that her bypass was occluded. She had no claudication at that time, but has had a history of lower extremity wounds and ulcerations. She was admitted to [facility] from 8/1-26 for septic shock with limb ischemia and cellulitis.  She was discharged to [facility]). Readmitted to this facility from 05/28/2017 to 06/01/2017 for metoprolol overdose. She is currently taking doxycycline and is on daily vancomycin for right lower extremity cellulitis.  However, she now returns to the hospital with progressively worse lower extremity edema of the right leg for the past 3 days.  No fever, no chills, sore throat, chest pain, dyspnea, palpitations, orthopnea, abdominal pain, nausea, emesis, diarrhea, melena or hematochezia.  She denies GU symptoms.  No polyuria, no polydipsia or blurred vision.  Pt reports her pain as her usual discomfort. Per my conversation with the patient.  Assessment & Plan:   Principal Problem:   Edema of right lower extremity Active Problems:   Chronic atrial fibrillation (HCC)   HTN (hypertension)   Hypothyroidism   Thrombocytosis (HCC)   Anemia   Hyperlipidemia   GERD (gastroesophageal reflux disease)   S/P femoral-popliteal bypass surgery   Edema of right lower extremity Transfer to [facility] for evaluation by Vascular  surgery Hold warfarin and start heparin Analgesics as needed. Arterial and venous Doppler in a.m. Vascular surgery, Dr. Scot Dock telephoned- I discussed personally with him. At this point per his exam patient can be managed as an outpatient. We are waiting for doppler to be performed. Will continue pain control today and most likely d/c in the am.    Chronic atrial fibrillation ([place]) CHA?DS?-VASc Scoreof at least 7. Continue Cardizem 240 mg p.o. daily. Stop coumadin Heparin    HTN (hypertension) Continue Cardizem 240 mg p.o. daily Also taking furosemide and metolazone. BP well controlled    Hypothyroidism Continue levothyroxine 75 mcg p.o. daily. Last TSH of 11.084 on 10/25- will need repeat TSH in 4 weeks    Thrombocytosis (HCC) Monitor platelets.    Anemia Type and screen patient Will order blood transfusion as patient H/H lower than 10 days ago No signs of bleeding at this time Monitor for signs of bleeding    Hyperlipidemia Continue pravastatin 10 mg p.o. daily.    GERD (gastroesophageal reflux disease) Protonix 40 mg p.o. daily.  DVT prophylaxis: Warfarin Code Status: DNR Family Communication: Husband  Disposition Plan: home next am.   Consultants:   Vascular surgery  Procedures:   none  Antimicrobials:   Vancomycin  Doxycycline    Subjective: Patient says her right leg is better and about as painful as it is at baseline.  Objective: Vitals:   06/10/17 0251 06/10/17 0318 06/10/17 0536 06/10/17 0840  BP: (!) 102/59 (!) 111/53 117/61 108/81  Pulse: 83 74 81   Resp: 18 18 20    Temp: 98.3 F (36.8 C) (!) 97.4 F (36.3 C) (!) 97.5 F (36.4 C)   TempSrc: Oral Oral Oral  SpO2: 95% 94% 93%   Weight:      Height:        Intake/Output Summary (Last 24 hours) at 06/10/2017 1227 Last data filed at 06/10/2017 0947 Gross per 24 hour  Intake 580 ml  Output 500 ml  Net 80 ml   Filed Weights   06/08/17 2301 06/09/17 1643  Weight: 71.1 kg  (156 lb 12.8 oz) 73.6 kg (162 lb 3.2 oz)    Examination:  General exam: Appears calm and comfortable, in nad. Respiratory system: Clear to auscultation. Respiratory effort normal. Cardiovascular system: S1 & S2 heard, RRR. No JVD, murmurs, rubs, gallops or clicks.  Gastrointestinal system: Abdomen is nondistended, soft and nontender. No organomegaly or masses felt. Normal bowel sounds heard. Central nervous system: Alert and oriented. No focal neurological deficits. Extremities: Symmetric 5 x 5 power. Skin: stasis changes and erythema of the right lower extremity, wounds bilateral lower extremities (see pictures) Psychiatry:  Mood & affect appropriate.           Data Reviewed: I have personally reviewed following labs and imaging studies  CBC: Recent Labs  Lab 06/08/17 1817 06/09/17 0610 06/09/17 1147 06/10/17 0857  WBC 8.5 8.1  --   --   NEUTROABS 7.0 6.8  --   --   HGB 7.3* 7.2* 7.3* 8.1*  HCT 25.4* 24.2* 25.2* 27.2*  MCV 81.7 82.3  --   --   PLT 462* 390  --   --    Basic Metabolic Panel: Recent Labs  Lab 06/08/17 1817 06/09/17 0610  NA 139 140  K 3.4* 3.4*  CL 108 108  CO2 20* 22  GLUCOSE 81 83  BUN 35* 36*  CREATININE 1.84* 1.72*  CALCIUM 7.9* 7.9*   GFR: Estimated Creatinine Clearance: 33.1 mL/min (A) (by C-G formula based on SCr of 1.72 mg/dL (H)). Liver Function Tests: Recent Labs  Lab 06/08/17 1817 06/09/17 0610  AST 18 14*  ALT 10* 9*  ALKPHOS 87 82  BILITOT 0.6 0.8  PROT 4.9* 4.6*  ALBUMIN 2.1* 1.9*   No results for input(s): LIPASE, AMYLASE in the last 168 hours. No results for input(s): AMMONIA in the last 168 hours. Coagulation Profile: Recent Labs  Lab 06/04/17 06/08/17 1817 06/09/17 1004 06/10/17 0857  INR 6.0 2.77 2.13 1.71   Cardiac Enzymes: No results for input(s): CKTOTAL, CKMB, CKMBINDEX, TROPONINI in the last 168 hours. BNP (last 3 results) No results for input(s): PROBNP in the last 8760 hours. HbA1C: No results  for input(s): HGBA1C in the last 72 hours. CBG: No results for input(s): GLUCAP in the last 168 hours. Lipid Profile: No results for input(s): CHOL, HDL, LDLCALC, TRIG, CHOLHDL, LDLDIRECT in the last 72 hours. Thyroid Function Tests: No results for input(s): TSH, T4TOTAL, FREET4, T3FREE, THYROIDAB in the last 72 hours. Anemia Panel: No results for input(s): VITAMINB12, FOLATE, FERRITIN, TIBC, IRON, RETICCTPCT in the last 72 hours. Sepsis Labs: Recent Labs  Lab 06/08/17 1821  LATICACIDVEN 1.2    No results found for this or any previous visit (from the past 240 hour(s)).       Radiology Studies: Dg Chest Port 1 View  Result Date: 06/09/2017 CLINICAL DATA:  PICC line placement EXAM: PORTABLE CHEST 1 VIEW COMPARISON:  05/28/2017 FINDINGS: Left upper extremity catheter tip overlies the SVC. Small right and small moderate left pleural effusions, likely layering and causing hazy opacity of the lower lung zones. Bibasilar consolidations. Cardiomegaly with vascular congestion and mild pulmonary edema. IMPRESSION: 1. Left upper  extremity catheter tip overlies the SVC 2. Increasing bilateral likely layering pleural effusions with bibasilar atelectasis or pneumonia 3. Cardiomegaly with vascular congestion and mild pulmonary edema Electronically Signed   By: Donavan Foil M.D.   On: 06/09/2017 23:46        Scheduled Meds: . aspirin EC  81 mg Oral Daily  . diltiazem  240 mg Oral Daily  . doxycycline  100 mg Oral BID  . furosemide  40 mg Oral Daily  . gabapentin  100 mg Oral TID  . hydroxyurea  1,000 mg Oral Daily  . levothyroxine  75 mcg Oral QAC breakfast  . mouth rinse  15 mL Mouth Rinse BID  . metolazone  2.5 mg Oral Q M,W,F  . pantoprazole  40 mg Oral Daily  . pravastatin  10 mg Oral QPM   Continuous Infusions: . heparin 1,100 Units/hr (06/10/17 1136)  . vancomycin (VANCOCIN) 500 mg/165mL IVPB Stopped (06/10/17 0147)     LOS: 0 days    Time spent: 36 minutes    Velvet Bathe, MD [facility] Pager [phone]  If 7PM-7AM, please contact night-coverage [email or web address] Password [facility] 06/10/2017, 12:27 PM

## 2017-06-10 NOTE — Care Management Note (Signed)
Case Management Note  Patient Details  Name: Charlotte [name] MRN: [id number] Date of Birth: 1940-09-06  Subjective/Objective:    Pt admitted on 06/08/17 with edema of Rt lower extremity.  PTA, pt resided at home with her spouse and son; she is active with [facility] for [facility], [place], [place] and aide.               Action/Plan: Will need resumption of care orders for [facility] to continue.  Pt requesting hospital bed for home, and [facility] is working on this with pt's PCP.    Expected Discharge Date:                  Expected Discharge Plan:  Home with home health services In-House Referral:     Discharge planning Services  CM Consult  Post Acute Care Choice:  Resumption of Svcs/PTA Provider Choice offered to:  Patient  DME Arranged:    DME Agency:     HH Arranged:    [place] Agency: [facility]  Status of Service:  In process, will continue to follow  If discussed at Long Length of Stay Meetings, dates discussed:    Additional Comments:  Ella Bodo, RN 06/10/2017, 4:21 PM

## 2017-06-10 NOTE — Progress Notes (Signed)
Preliminary results by tech - Right Lower Ext. Venous Duplex Completed. Negative for acute deep vein thrombosis. 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 A large cystic structure was noted in the right groin. Oda Cogan, BS, RDMS, RVT

## 2017-06-10 NOTE — Progress Notes (Signed)
   Noted patient dvt study preliminarily negative. Her wounds are at least stable and possibly minimally better from last office visit. [place] for discharge from vascular standpoint and we can move her appointment back from this Friday. If she is going to stay another day I will get ABI and bypass graft duplex while she is here.   Dyshawn Cangelosi C. [name], MD [facility] [place] Office: [phone] Pager: [phone]

## 2017-06-10 NOTE — H&P (Addendum)
ANTICOAGULATION CONSULT NOTE - Follow Up Consult  Pharmacy Consult for Heparin (while Coumadin on hold) Indication: atrial fibrillation and hx of DVT  No Known Allergies  Patient Measurements: Height: 6' (182.9 cm) Weight: 162 lb 3.2 oz (73.6 kg) IBW/kg (Calculated) : 73.1 Heparin Dosing Weight: 73 kg  Vital Signs: Temp: 97.5 F (36.4 C) (11/07 0536) Temp Source: Oral (11/07 0536) BP: 108/81 (11/07 0840) Pulse Rate: 81 (11/07 0536)  Labs: Recent Labs    06/08/17 1817 06/09/17 0610 06/09/17 1004 06/09/17 1147 06/10/17 0857  HGB 7.3* 7.2*  --  7.3* 8.1*  HCT 25.4* 24.2*  --  25.2* 27.2*  PLT 462* 390  --   --   --   LABPROT 29.0*  --  23.7*  --  19.9*  INR 2.77  --  2.13  --  1.71  CREATININE 1.84* 1.72*  --   --   --     Estimated Creatinine Clearance: 33.1 mL/min (A) (by C-G formula based on SCr of 1.72 mg/dL (H)).   Medications:  Scheduled:  . aspirin EC  81 mg Oral Daily  . diltiazem  240 mg Oral Daily  . doxycycline  100 mg Oral BID  . furosemide  40 mg Oral Daily  . gabapentin  100 mg Oral TID  . hydroxyurea  1,000 mg Oral Daily  . levothyroxine  75 mcg Oral QAC breakfast  . mouth rinse  15 mL Mouth Rinse BID  . metolazone  2.5 mg Oral Q M,W,F  . pantoprazole  40 mg Oral Daily  . pravastatin  10 mg Oral QPM   Infusions:  . vancomycin (VANCOCIN) 500 mg/153mL IVPB Stopped (06/10/17 0147)    Assessment: 76 yo F presented to [facility] with [name].  Pt has hx of recent LE revasc procedure.  Transferred to [facility] for VVS eval.  Hold Coumadin and start Heparin pending VVS eval.  INR 1.71  Goal of Therapy:  Heparin level 0.3-0.7 units/ml Monitor platelets by anticoagulation protocol: Yes   Plan:  Begin Heparin at 1100 units/hr (no bolus) Heparin level in 8 hours Heparin level, INR, and CBC daily Follow-up Coumadin PTA dose Follow-up LE doppler results Follow-up plans per VVS Watch K (3.4)  Nazli Penn, Pharm.D., BCPS Clinical Pharmacist Pager:  [phone] Clinical phone for 06/10/2017 from 8:30-4:00 is [phone]. After 4pm, please call Main Rx ([phone]) for assistance. 06/10/2017 10:55 AM   Addendum: Spoke with Dr. Wendee Beavers.  No plans for vascular intervention this admission.  Will restart Coumadin tonight.  INR 1.71 after doses held.    Coumadin 5mg  PO x 1 tonight. Daily INR.  [employer], Pharm.D., BCPS Clinical Pharmacist Pager: [phone] Clinical phone for 06/10/2017 from 8:30-4:00 is [phone]. After 4pm, please call Main Rx [phone]) for assistance. 06/10/2017 5:37 PM

## 2017-06-11 ENCOUNTER — Telehealth: Payer: Self-pay | Admitting: Vascular Surgery

## 2017-06-11 LAB — CBC
HCT: 28.8 % — ABNORMAL LOW (ref 36.0–46.0)
Hemoglobin: 8.6 g/dL — ABNORMAL LOW (ref 12.0–15.0)
MCH: 24.9 pg — AB (ref 26.0–34.0)
MCHC: 29.9 g/dL — ABNORMAL LOW (ref 30.0–36.0)
MCV: 83.2 fL (ref 78.0–100.0)
PLATELETS: 297 10*3/uL (ref 150–400)
RBC: 3.46 MIL/uL — ABNORMAL LOW (ref 3.87–5.11)
RDW: 21.5 % — AB (ref 11.5–15.5)
WBC: 7.7 10*3/uL (ref 4.0–10.5)

## 2017-06-11 LAB — PROTIME-INR
INR: 1.63
Prothrombin Time: 19.2 seconds — ABNORMAL HIGH (ref 11.4–15.2)

## 2017-06-11 LAB — HEPARIN LEVEL (UNFRACTIONATED): Heparin Unfractionated: 0.28 IU/mL — ABNORMAL LOW (ref 0.30–0.70)

## 2017-06-11 MED ORDER — WARFARIN SODIUM 5 MG PO TABS
5.0000 mg | ORAL_TABLET | ORAL | Status: AC
  Administered 2017-06-11: 5 mg via ORAL
  Filled 2017-06-11: qty 1

## 2017-06-11 NOTE — Progress Notes (Signed)
    Durable Medical Equipment  (From admission, onward)        Start     Ordered   06/11/17 1443  For home use only DME Hospital bed  Once    Question:  Bed type  Answer:  Semi-electric   06/11/17 [place] RN,BSN,CM

## 2017-06-11 NOTE — Care Management Note (Addendum)
Case Management Note  Patient Details  Name: Charlotte [name] MRN: [id number] Date of Birth: 16-Dec-1940  Subjective/Objective:                  Pt admitted on 06/08/17 with edema of Rt lower extremity.  Action/Plan: Plan is to d/c to home today with the resumption of home health services ( RN,PT,OT,NA). Pt is to f/u with vascular MD within a month.  06/11/2017 @1458  [facility] called for transportation to home. Expected Discharge Date:    06/11/2017            Expected Discharge Plan:  [place] [facility](transfer to [facility])  In-House Referral:     Discharge planning Services  CM Consult  Post Acute Care Choice:  Resumption of Svcs/PTA Provider Choice offered to:  Patient  DME Arranged:    Hospital bed, pending MD's order. Order has been requested by CM. [facility] provides pt with home oxygen. DME Agency:    [facility]  HH Arranged:  RN, PT, OT, Nurse's Aide [place] Agency:  [facility] @ [phone] aware of d/c plan for today.  Status of Service:  Completed, signed off  If discussed at [place] of Stay Meetings, dates discussed:    Additional Comments:  Sharin Mons, RN 06/11/2017, 10:22 AM

## 2017-06-11 NOTE — Discharge Summary (Signed)
Physician Discharge Summary  Charlotte [name] TWS:[id number] DOB: [birth year] DOA: 06/08/2017  PCP: Charlotte Sites, MD  Admit date: 06/08/2017 Discharge date: 06/11/2017  Time spent: > 35 minutes  Recommendations for Outpatient Follow-up:  1. Monitor INR levels 2. Monitor hgb levels.   Discharge Diagnoses:  Principal Problem:   Edema of right lower extremity Active Problems:   Chronic atrial fibrillation (HCC)   HTN (hypertension)   Hypothyroidism   Thrombocytosis (HCC)   Anemia   Hyperlipidemia   GERD (gastroesophageal reflux disease)   S/P femoral-popliteal bypass surgery   Discharge Condition: stable  Diet recommendation: Heart healthy  Filed Weights   06/08/17 2301 06/09/17 1643  Weight: 71.1 kg (156 lb 12.8 oz) 73.6 kg (162 lb 3.2 oz)    History of present illness:  76 yo F presented to [facility] with [name]. Pt has hx of recent LE revasc procedure. Transferred to [facility] for VVS eval.  Hospital Course:   Edema of right lower extremity Transfer to [facility] for evaluation by Vascular surgery Started on Warfarin again Vascular surgery on board while patient was in house and it and reported the following:  76 y.o. female s/p rle bypass, patent by physical exam, wounds are stable  Plan: Ok for dc and will move her f/u appt back to 1 month Continue home wound care  Chronic atrial fibrillation ([place]) CHA?DS?-VASc Scoreof at least 7. Continue prior to admission medication regimen.  HTN (hypertension) Continue Cardizem 240 mg p.o. daily Also taking furosemide and metolazone. BP well controlled  Hypothyroidism Continue levothyroxine on d/c  Thrombocytosis (HCC) Monitor platelets.  Anemia S/p transfusion  Hyperlipidemia Continue pravastatin 10 mg p.o. daily.  GERD (gastroesophageal reflux disease) Protonix 40 mg p.o. daily.  Procedures:  None  Consultations:  Vascular surgery: Dr. Scot [name] and Dr. Donzetta [name]  Discharge Exam: Vitals:    06/11/17 0546 06/11/17 0821  BP: 129/66 (!) 121/58  Pulse: 83   Resp: 18   Temp: 97.8 F (36.6 C)   SpO2: 93%     General: Pt in nad, alert and awake Cardiovascular: s1 and s2 wnl, no rubs Respiratory: no increased wob, no wheezes  Discharge Instructions   Discharge Instructions    Diet - low sodium heart healthy   Complete by:  As directed    Increase activity slowly   Complete by:  As directed      Current Discharge Medication List    CONTINUE these medications which have NOT CHANGED   Details  acetaminophen (TYLENOL) 325 MG tablet Take 2 tablets (650 mg total) by mouth every 6 (six) hours as needed for mild pain or fever.    aspirin EC 81 MG EC tablet Take 1 tablet (81 mg total) by mouth daily.    diltiazem (CARDIZEM CD) 240 MG 24 hr capsule Take 240 mg by mouth daily.    doxycycline (VIBRAMYCIN) 100 MG capsule Take 1 capsule (100 mg total) by mouth 2 (two) times daily. Qty: 20 capsule, Refills: 0    furosemide (LASIX) 40 MG tablet Take 1 tablet (40 mg total) by mouth every other day. Qty: 30 tablet    gabapentin (NEURONTIN) 100 MG capsule Take 100 mg by mouth 3 (three) times daily.      hydroxyurea (HYDREA) 500 MG capsule Take 2 capsules (1,000 mg total) by mouth daily. May take with food to minimize GI side effects. Qty: 60 capsule, Refills: 2   Associated Diagnoses: Polycythemia vera (HCC)    ibuprofen (ADVIL,MOTRIN) 200 MG tablet Take 400 mg  every 6 (six) hours as needed by mouth for mild pain or moderate pain.    levothyroxine (SYNTHROID, LEVOTHROID) 75 MCG tablet Take 1 tablet (75 mcg total) by mouth daily before breakfast.    metolazone (ZAROXOLYN) 2.5 MG tablet Take 2.5 mg by mouth every Monday, Wednesday, and Friday.    pravastatin (PRAVACHOL) 10 MG tablet Take 1 tablet (10 mg total) by mouth daily.    vancomycin 500 mg in sodium chloride 0.9 % 100 mL Inject 500 mg into the vein daily.    amiodarone (PACERONE) 200 MG tablet Take 1 tablet (200 mg  total) by mouth daily.    midodrine (PROAMATINE) 5 MG tablet Take 1 tablet (5 mg total) by mouth 3 (three) times daily with meals.    warfarin (COUMADIN) 2.5 MG tablet Take 1 tablet (2.5 mg total) by mouth daily at 6 PM. Take one tablet all days except take 2.5mg  on MWF Qty: 10 tablet, Refills: 0      STOP taking these medications     feeding supplement, ENSURE ENLIVE, (ENSURE ENLIVE) LIQD      Multiple Vitamin (MULTIVITAMIN WITH MINERALS) TABS tablet      potassium chloride SA (K-DUR,KLOR-CON) 20 MEQ tablet        No Known Allergies Follow-up Information    [facility] Follow up.   Specialty:  [place] Why:  home health services arranged, office will call and set up home visits Contact information: [place] [address] [place] Follow up.   Why:  hospital bed Contact information: [address] [place] [address] [phone]            The results of significant diagnostics from this hospitalization (including imaging, microbiology, ancillary and laboratory) are listed below for reference.    Significant Diagnostic Studies: Dg Chest Port 1 View  Result Date: 06/09/2017 CLINICAL DATA:  PICC line placement EXAM: PORTABLE CHEST 1 VIEW COMPARISON:  05/28/2017 FINDINGS: Left upper extremity catheter tip overlies the SVC. Small right and small moderate left pleural effusions, likely layering and causing hazy opacity of the lower lung zones. Bibasilar consolidations. Cardiomegaly with vascular congestion and mild pulmonary edema. IMPRESSION: 1. Left upper extremity catheter tip overlies the SVC 2. Increasing bilateral likely layering pleural effusions with bibasilar atelectasis or pneumonia 3. Cardiomegaly with vascular congestion and mild pulmonary edema Electronically Signed   By: Donavan Foil M.D.   On: 06/09/2017 23:46   Dg Chest Portable 1 View  Result Date:  05/28/2017 CLINICAL DATA:  Central catheter placement.  Hypotension. EXAM: PORTABLE CHEST 1 VIEW COMPARISON:  March 28, 2017 FINDINGS: Central catheter tip is in the superior vena cava. No pneumothorax. There is patchy airspace opacity in the medial left lung base. There are minimal pleural effusions bilaterally. Lungs elsewhere are clear. There is borderline cardiomegaly with mild pulmonary venous hypertension. No adenopathy. No bone lesions. IMPRESSION: Central catheter tip in superior vena cava. No pneumothorax. Pulmonary vascular congestion with small pleural effusions bilaterally. Medial airspace consolidation in the left base is likely due to superimposed pneumonia. Followup PA and lateral chest radiographs recommended in 3-4 weeks following trial of antibiotic therapy to ensure resolution and exclude underlying malignancy. Electronically Signed   By: Lowella Grip III M.D.   On: 05/28/2017 15:48    Microbiology: No results found for this or any previous visit (from the past 240 hour(s)).   Labs: Basic Metabolic  Panel: Recent Labs  Lab 06/08/17 1817 06/09/17 0610  NA 139 140  K 3.4* 3.4*  CL 108 108  CO2 20* 22  GLUCOSE 81 83  BUN 35* 36*  CREATININE 1.84* 1.72*  CALCIUM 7.9* 7.9*   Liver Function Tests: Recent Labs  Lab 06/08/17 1817 06/09/17 0610  AST 18 14*  ALT 10* 9*  ALKPHOS 87 82  BILITOT 0.6 0.8  PROT 4.9* 4.6*  ALBUMIN 2.1* 1.9*   No results for input(s): LIPASE, AMYLASE in the last 168 hours. No results for input(s): AMMONIA in the last 168 hours. CBC: Recent Labs  Lab 06/08/17 1817 06/09/17 0610 06/09/17 1147 06/10/17 0857 06/11/17 0525  WBC 8.5 8.1  --   --  7.7  NEUTROABS 7.0 6.8  --   --   --   HGB 7.3* 7.2* 7.3* 8.1* 8.6*  HCT 25.4* 24.2* 25.2* 27.2* 28.8*  MCV 81.7 82.3  --   --  83.2  PLT 462* 390  --   --  297   Cardiac Enzymes: No results for input(s): CKTOTAL, CKMB, CKMBINDEX, TROPONINI in the last 168 hours. BNP: BNP (last 3  results) Recent Labs    02/12/17 1505 03/11/17 2115 05/28/17 1616  BNP 608.0* 1,374.8* 605.0*    ProBNP (last 3 results) No results for input(s): PROBNP in the last 8760 hours.  CBG: No results for input(s): GLUCAP in the last 168 hours.     Signed:  Velvet Bathe MD.  [facility] 06/11/2017, 12:46 PM

## 2017-06-11 NOTE — Telephone Encounter (Signed)
-----   Message from Mena Goes, RN sent at 06/11/2017  8:47 AM EST ----- Regarding: move all appts from tomorrow 11-9 out 4-6 weeks   ----- Message ----- From: Waynetta Sandy, MD Sent: 06/11/2017   8:26 AM To: [address]  JAZZMIN NEWBOLD [phone] [birth year]  Please move her appt and studies out 4-6 weeks rather than tomorrow.

## 2017-06-11 NOTE — Telephone Encounter (Signed)
Resched appt 07/17/17; labs at 2:00 and MD at 3:15.

## 2017-06-11 NOTE — Progress Notes (Signed)
  Progress Note    06/11/2017 8:24 AM * No surgery found *  Subjective:  No new complaints  Vitals:   06/10/17 2126 06/11/17 0546  BP: 105/63 129/66  Pulse: 84 83  Resp: 18 18  Temp: 97.8 F (36.6 C) 97.8 F (36.6 C)  SpO2: 95% 93%    Physical Exam: aaox3 Wound on medial right leg is stable Strong pt signal on right     CBC    Component Value Date/Time   WBC 7.7 06/11/2017 0525   RBC 3.46 (L) 06/11/2017 0525   HGB 8.6 (L) 06/11/2017 0525   HCT 28.8 (L) 06/11/2017 0525   PLT 297 06/11/2017 0525   MCV 83.2 06/11/2017 0525   MCH 24.9 (L) 06/11/2017 0525   MCHC 29.9 (L) 06/11/2017 0525   RDW 21.5 (H) 06/11/2017 0525   LYMPHSABS 0.6 (L) 06/09/2017 0610   MONOABS 0.2 06/09/2017 0610   EOSABS 0.2 06/09/2017 0610   BASOSABS 0.4 (H) 06/09/2017 0610    BMET    Component Value Date/Time   NA 140 06/09/2017 0610   K 3.4 (L) 06/09/2017 0610   CL 108 06/09/2017 0610   CO2 22 06/09/2017 0610   GLUCOSE 83 06/09/2017 0610   BUN 36 (H) 06/09/2017 0610   CREATININE 1.72 (H) 06/09/2017 0610   CREATININE 1.21 (H) 08/14/2016 0733   CALCIUM 7.9 (L) 06/09/2017 0610   GFRNONAA 28 (L) 06/09/2017 0610   GFRAA 33 (L) 06/09/2017 0610    INR    Component Value Date/Time   INR 1.63 06/11/2017 0525     Intake/Output Summary (Last 24 hours) at 06/11/2017 0824 Last data filed at 06/11/2017 0600 Gross per 24 hour  Intake 679.66 ml  Output 1000 ml  Net -320.34 ml     Assessment:  76 y.o. female s/p rle bypass, patent by physical exam, wounds are stable  Plan: Ok for dc and will move her f/u appt back to 1 month Continue home wound care  Crue Otero C. [name], MD [facility] [place] Office: [phone] Pager: [phone]  06/11/2017 8:24 AM

## 2017-06-11 NOTE — H&P (Signed)
ANTICOAGULATION CONSULT NOTE - Follow Up Consult  Pharmacy Consult for Heparin and Coumadin Indication: atrial fibrillation and hx of DVT  No Known Allergies  Patient Measurements: Height: 6' (182.9 cm) Weight: 162 lb 3.2 oz (73.6 kg) IBW/kg (Calculated) : 73.1 Heparin Dosing Weight: 73 kg  Vital Signs: Temp: 97.8 F (36.6 C) (11/08 0546) Temp Source: Oral (11/08 0546) BP: 121/58 (11/08 0821) Pulse Rate: 83 (11/08 0546)  Labs: Recent Labs    06/08/17 1817 06/09/17 0610 06/09/17 1004 06/09/17 1147 06/10/17 0857 06/10/17 2042 06/11/17 0525  HGB 7.3* 7.2*  --  7.3* 8.1*  --  8.6*  HCT 25.4* 24.2*  --  25.2* 27.2*  --  28.8*  PLT 462* 390  --   --   --   --  297  LABPROT 29.0*  --  23.7*  --  19.9*  --  19.2*  INR 2.77  --  2.13  --  1.71  --  1.63  HEPARINUNFRC  --   --   --   --   --  0.13* 0.28*  CREATININE 1.84* 1.72*  --   --   --   --   --     Estimated Creatinine Clearance: 33.1 mL/min (A) (by C-G formula based on SCr of 1.72 mg/dL (H)).   Medications:  Scheduled:  . aspirin EC  81 mg Oral Daily  . diltiazem  240 mg Oral Daily  . doxycycline  100 mg Oral BID  . furosemide  40 mg Oral Daily  . gabapentin  100 mg Oral TID  . hydroxyurea  1,000 mg Oral Daily  . levothyroxine  75 mcg Oral QAC breakfast  . mouth rinse  15 mL Mouth Rinse BID  . metolazone  2.5 mg Oral Q M,W,F  . pantoprazole  40 mg Oral Daily  . pravastatin  10 mg Oral QPM  . Warfarin - Pharmacist Dosing Inpatient   Does not apply q1800   Infusions:  . heparin 1,300 Units/hr (06/11/17 0504)  . vancomycin (VANCOCIN) 500 mg/136mL IVPB Stopped (06/10/17 2320)    Assessment: 76 yo F presented to [facility] with [name].  Pt has hx of recent LE revasc procedure. Transferred to [facility] for [facility] eval.  No plans for vascular intervention this admission.  Plan to d/c home.  Will discontinue heparin and order Coumadin dose prior to discharge.   INR 1.63  Goal of Therapy:  Heparin level 0.3-0.7 units/ml   INR 2-3 Monitor platelets by anticoagulation protocol: Yes   Plan:  D/C heparin and associated labs Coumadin 5mg  PO x 1 now  [employer], Pharm.D., BCPS Clinical Pharmacist Pager: [phone] Clinical phone for 06/11/2017 from 8:30-4:00 is [phone]. After 4pm, please call Main Rx [phone]) for assistance. 06/11/2017 10:59 AM

## 2017-06-11 NOTE — Progress Notes (Addendum)
Charlotte [name] to be D/C'd Home per MD order.  Discussed with the patient and all questions fully answered.  An After Visit Summary was printed and given to the patient. Patient received prescription.  D/c education completed with patient/family including follow up instructions, medication list, d/c activities limitations if indicated, with other d/c instructions as indicated by MD - patient able to verbalize understanding, all questions fully answered.   Patient instructed to return to ED, call 911, or call MD for any changes in condition.   Patient is waiting on transport. PTAR scheduled by [name], Case Manager.  [name] 06/11/2017 3:26 PM

## 2017-06-12 ENCOUNTER — Other Ambulatory Visit (HOSPITAL_COMMUNITY): Payer: PRIVATE HEALTH INSURANCE

## 2017-06-12 ENCOUNTER — Ambulatory Visit: Payer: PRIVATE HEALTH INSURANCE | Admitting: Vascular Surgery

## 2017-06-12 ENCOUNTER — Telehealth: Payer: Self-pay | Admitting: *Deleted

## 2017-06-12 ENCOUNTER — Encounter (HOSPITAL_COMMUNITY): Payer: PRIVATE HEALTH INSURANCE

## 2017-06-12 ENCOUNTER — Ambulatory Visit (INDEPENDENT_AMBULATORY_CARE_PROVIDER_SITE_OTHER): Payer: Medicare Other | Admitting: *Deleted

## 2017-06-12 DIAGNOSIS — I482 Chronic atrial fibrillation, unspecified: Secondary | ICD-10-CM

## 2017-06-12 DIAGNOSIS — Z5181 Encounter for therapeutic drug level monitoring: Secondary | ICD-10-CM | POA: Diagnosis not present

## 2017-06-12 DIAGNOSIS — L03115 Cellulitis of right lower limb: Secondary | ICD-10-CM | POA: Diagnosis not present

## 2017-06-12 DIAGNOSIS — T8140XD Infection following a procedure, unspecified, subsequent encounter: Secondary | ICD-10-CM | POA: Diagnosis not present

## 2017-06-12 DIAGNOSIS — I5042 Chronic combined systolic (congestive) and diastolic (congestive) heart failure: Secondary | ICD-10-CM | POA: Diagnosis not present

## 2017-06-12 DIAGNOSIS — B9689 Other specified bacterial agents as the cause of diseases classified elsewhere: Secondary | ICD-10-CM | POA: Diagnosis not present

## 2017-06-12 DIAGNOSIS — I110 Hypertensive heart disease with heart failure: Secondary | ICD-10-CM | POA: Diagnosis not present

## 2017-06-12 DIAGNOSIS — L89890 Pressure ulcer of other site, unstageable: Secondary | ICD-10-CM | POA: Diagnosis not present

## 2017-06-12 LAB — BPAM RBC
BLOOD PRODUCT EXPIRATION DATE: 201811302359
Blood Product Expiration Date: 201811272359
ISSUE DATE / TIME: 201811070253
UNIT TYPE AND RH: 6200
UNIT TYPE AND RH: 6200

## 2017-06-12 LAB — TYPE AND SCREEN
ABO/RH(D): A POS
ANTIBODY SCREEN: NEGATIVE
UNIT DIVISION: 0
Unit division: 0

## 2017-06-12 LAB — POCT INR: INR: 1.8

## 2017-06-12 NOTE — Telephone Encounter (Signed)
Done.  See coumadin note. 

## 2017-06-12 NOTE — Telephone Encounter (Signed)
Per Lenna Sciara w/ [facility] INR 1.8 --can be reached @ [phone]

## 2017-06-13 LAB — TYPE AND SCREEN
ABO/RH(D): A POS
ANTIBODY SCREEN: NEGATIVE
Unit division: 0
Unit division: 0

## 2017-06-13 LAB — BPAM RBC
BLOOD PRODUCT EXPIRATION DATE: 201811302359
Blood Product Expiration Date: 201811302359
UNIT TYPE AND RH: 6200
Unit Type and Rh: 6200

## 2017-06-15 ENCOUNTER — Telehealth: Payer: Self-pay | Admitting: *Deleted

## 2017-06-15 ENCOUNTER — Ambulatory Visit (INDEPENDENT_AMBULATORY_CARE_PROVIDER_SITE_OTHER): Payer: Medicare Other | Admitting: *Deleted

## 2017-06-15 DIAGNOSIS — I5042 Chronic combined systolic (congestive) and diastolic (congestive) heart failure: Secondary | ICD-10-CM | POA: Diagnosis not present

## 2017-06-15 DIAGNOSIS — Z5181 Encounter for therapeutic drug level monitoring: Secondary | ICD-10-CM

## 2017-06-15 DIAGNOSIS — L89890 Pressure ulcer of other site, unstageable: Secondary | ICD-10-CM | POA: Diagnosis not present

## 2017-06-15 DIAGNOSIS — I482 Chronic atrial fibrillation, unspecified: Secondary | ICD-10-CM

## 2017-06-15 DIAGNOSIS — B9689 Other specified bacterial agents as the cause of diseases classified elsewhere: Secondary | ICD-10-CM | POA: Diagnosis not present

## 2017-06-15 DIAGNOSIS — T8140XD Infection following a procedure, unspecified, subsequent encounter: Secondary | ICD-10-CM | POA: Diagnosis not present

## 2017-06-15 DIAGNOSIS — I110 Hypertensive heart disease with heart failure: Secondary | ICD-10-CM | POA: Diagnosis not present

## 2017-06-15 DIAGNOSIS — L03115 Cellulitis of right lower limb: Secondary | ICD-10-CM | POA: Diagnosis not present

## 2017-06-15 LAB — POCT INR: INR: 2.2

## 2017-06-15 NOTE — Telephone Encounter (Signed)
Done.  See coumadin note. 

## 2017-06-15 NOTE — Telephone Encounter (Signed)
INR 2.2 per Maudie Mercury w/ [facility] [phone]

## 2017-06-16 DIAGNOSIS — I5042 Chronic combined systolic (congestive) and diastolic (congestive) heart failure: Secondary | ICD-10-CM | POA: Diagnosis not present

## 2017-06-16 DIAGNOSIS — L89890 Pressure ulcer of other site, unstageable: Secondary | ICD-10-CM | POA: Diagnosis not present

## 2017-06-16 DIAGNOSIS — B9689 Other specified bacterial agents as the cause of diseases classified elsewhere: Secondary | ICD-10-CM | POA: Diagnosis not present

## 2017-06-16 DIAGNOSIS — L03115 Cellulitis of right lower limb: Secondary | ICD-10-CM | POA: Diagnosis not present

## 2017-06-16 DIAGNOSIS — I110 Hypertensive heart disease with heart failure: Secondary | ICD-10-CM | POA: Diagnosis not present

## 2017-06-16 DIAGNOSIS — T8140XD Infection following a procedure, unspecified, subsequent encounter: Secondary | ICD-10-CM | POA: Diagnosis not present

## 2017-06-17 DIAGNOSIS — I110 Hypertensive heart disease with heart failure: Secondary | ICD-10-CM | POA: Diagnosis not present

## 2017-06-17 DIAGNOSIS — L03115 Cellulitis of right lower limb: Secondary | ICD-10-CM | POA: Diagnosis not present

## 2017-06-17 DIAGNOSIS — T8140XD Infection following a procedure, unspecified, subsequent encounter: Secondary | ICD-10-CM | POA: Diagnosis not present

## 2017-06-17 DIAGNOSIS — B9689 Other specified bacterial agents as the cause of diseases classified elsewhere: Secondary | ICD-10-CM | POA: Diagnosis not present

## 2017-06-17 DIAGNOSIS — L89890 Pressure ulcer of other site, unstageable: Secondary | ICD-10-CM | POA: Diagnosis not present

## 2017-06-17 DIAGNOSIS — I5042 Chronic combined systolic (congestive) and diastolic (congestive) heart failure: Secondary | ICD-10-CM | POA: Diagnosis not present

## 2017-06-18 DIAGNOSIS — I5042 Chronic combined systolic (congestive) and diastolic (congestive) heart failure: Secondary | ICD-10-CM | POA: Diagnosis not present

## 2017-06-18 DIAGNOSIS — I110 Hypertensive heart disease with heart failure: Secondary | ICD-10-CM | POA: Diagnosis not present

## 2017-06-18 DIAGNOSIS — L89890 Pressure ulcer of other site, unstageable: Secondary | ICD-10-CM | POA: Diagnosis not present

## 2017-06-18 DIAGNOSIS — L03115 Cellulitis of right lower limb: Secondary | ICD-10-CM | POA: Diagnosis not present

## 2017-06-18 DIAGNOSIS — B9689 Other specified bacterial agents as the cause of diseases classified elsewhere: Secondary | ICD-10-CM | POA: Diagnosis not present

## 2017-06-18 DIAGNOSIS — T8140XD Infection following a procedure, unspecified, subsequent encounter: Secondary | ICD-10-CM | POA: Diagnosis not present

## 2017-06-19 DIAGNOSIS — T8140XD Infection following a procedure, unspecified, subsequent encounter: Secondary | ICD-10-CM | POA: Diagnosis not present

## 2017-06-19 DIAGNOSIS — I5042 Chronic combined systolic (congestive) and diastolic (congestive) heart failure: Secondary | ICD-10-CM | POA: Diagnosis not present

## 2017-06-19 DIAGNOSIS — L89890 Pressure ulcer of other site, unstageable: Secondary | ICD-10-CM | POA: Diagnosis not present

## 2017-06-19 DIAGNOSIS — L03115 Cellulitis of right lower limb: Secondary | ICD-10-CM | POA: Diagnosis not present

## 2017-06-19 DIAGNOSIS — I110 Hypertensive heart disease with heart failure: Secondary | ICD-10-CM | POA: Diagnosis not present

## 2017-06-19 DIAGNOSIS — B9689 Other specified bacterial agents as the cause of diseases classified elsewhere: Secondary | ICD-10-CM | POA: Diagnosis not present

## 2017-06-22 ENCOUNTER — Encounter (HOSPITAL_COMMUNITY): Payer: Medicare Other

## 2017-06-22 ENCOUNTER — Encounter (HOSPITAL_COMMUNITY): Payer: Self-pay | Admitting: Oncology

## 2017-06-22 ENCOUNTER — Encounter (HOSPITAL_COMMUNITY): Payer: Medicare Other | Attending: Oncology | Admitting: Oncology

## 2017-06-22 ENCOUNTER — Other Ambulatory Visit: Payer: Self-pay

## 2017-06-22 ENCOUNTER — Ambulatory Visit (INDEPENDENT_AMBULATORY_CARE_PROVIDER_SITE_OTHER): Payer: Medicare Other | Admitting: *Deleted

## 2017-06-22 VITALS — BP 107/54 | HR 65 | Resp 16

## 2017-06-22 DIAGNOSIS — L03115 Cellulitis of right lower limb: Secondary | ICD-10-CM | POA: Diagnosis not present

## 2017-06-22 DIAGNOSIS — I482 Chronic atrial fibrillation, unspecified: Secondary | ICD-10-CM

## 2017-06-22 DIAGNOSIS — Z5181 Encounter for therapeutic drug level monitoring: Secondary | ICD-10-CM

## 2017-06-22 DIAGNOSIS — I110 Hypertensive heart disease with heart failure: Secondary | ICD-10-CM | POA: Diagnosis not present

## 2017-06-22 DIAGNOSIS — L89890 Pressure ulcer of other site, unstageable: Secondary | ICD-10-CM | POA: Diagnosis not present

## 2017-06-22 DIAGNOSIS — D45 Polycythemia vera: Secondary | ICD-10-CM | POA: Insufficient documentation

## 2017-06-22 DIAGNOSIS — B9689 Other specified bacterial agents as the cause of diseases classified elsewhere: Secondary | ICD-10-CM | POA: Diagnosis not present

## 2017-06-22 DIAGNOSIS — I5042 Chronic combined systolic (congestive) and diastolic (congestive) heart failure: Secondary | ICD-10-CM | POA: Diagnosis not present

## 2017-06-22 DIAGNOSIS — T8140XD Infection following a procedure, unspecified, subsequent encounter: Secondary | ICD-10-CM | POA: Diagnosis not present

## 2017-06-22 LAB — COMPREHENSIVE METABOLIC PANEL
ALBUMIN: 2.5 g/dL — AB (ref 3.5–5.0)
ALK PHOS: 99 U/L (ref 38–126)
ALT: 13 U/L — AB (ref 14–54)
ANION GAP: 8 (ref 5–15)
AST: 19 U/L (ref 15–41)
BILIRUBIN TOTAL: 0.9 mg/dL (ref 0.3–1.2)
BUN: 41 mg/dL — AB (ref 6–20)
CALCIUM: 8.7 mg/dL — AB (ref 8.9–10.3)
CO2: 27 mmol/L (ref 22–32)
Chloride: 101 mmol/L (ref 101–111)
Creatinine, Ser: 1.58 mg/dL — ABNORMAL HIGH (ref 0.44–1.00)
GFR calc Af Amer: 36 mL/min — ABNORMAL LOW (ref >60)
GFR calc non Af Amer: 31 mL/min — ABNORMAL LOW (ref >60)
GLUCOSE: 84 mg/dL (ref 65–99)
Potassium: 3.4 mmol/L — ABNORMAL LOW (ref 3.5–5.1)
SODIUM: 136 mmol/L (ref 135–145)
Total Protein: 5.8 g/dL — ABNORMAL LOW (ref 6.5–8.1)

## 2017-06-22 LAB — CBC WITH DIFFERENTIAL/PLATELET
BASOS ABS: 0.3 10*3/uL — AB (ref 0.0–0.1)
Basophils Relative: 2 %
EOS ABS: 0.4 10*3/uL (ref 0.0–0.7)
Eosinophils Relative: 3 %
HEMATOCRIT: 33.5 % — AB (ref 36.0–46.0)
HEMOGLOBIN: 9.6 g/dL — AB (ref 12.0–15.0)
LYMPHS PCT: 7 %
Lymphs Abs: 0.9 10*3/uL (ref 0.7–4.0)
MCH: 25.4 pg — ABNORMAL LOW (ref 26.0–34.0)
MCHC: 28.7 g/dL — AB (ref 30.0–36.0)
MCV: 88.6 fL (ref 78.0–100.0)
MONOS PCT: 3 %
Monocytes Absolute: 0.4 10*3/uL (ref 0.1–1.0)
NEUTROS PCT: 85 %
Neutro Abs: 11 10*3/uL — ABNORMAL HIGH (ref 1.7–7.7)
Platelets: 149 10*3/uL — ABNORMAL LOW (ref 150–400)
RBC: 3.78 MIL/uL — AB (ref 3.87–5.11)
RDW: 24.9 % — ABNORMAL HIGH (ref 11.5–15.5)
WBC: 13 10*3/uL — ABNORMAL HIGH (ref 4.0–10.5)

## 2017-06-22 LAB — POCT INR: INR: 2

## 2017-06-22 NOTE — Progress Notes (Signed)
[address], [place] [address]    CLINIC:  Medical Oncology/Hematology  PRIMARY CARE PROVIDER:  [name], [place] [address]  REASON FOR FOLLOW-UP:  Polycythemia vera   CURRENT THERAPY: Hydrea   INTERVAL HISTORY: Charlotte [name] 76 y.o. 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    Patient presents today for evaluation of her polycythemia vera with her husband.  She went to the ED on 06/08/2017 for right leg swelling for 3 days.  She has been on daily vancomycin for right lower extremity cellulitis with dry gangrene of her right foot.  She was seen by vascular surgery and they say that her bypass graft in her right lower extremity was patent and thought that her right lobe to the swelling was most likely related to her bypass.  They recommended outpatient follow-up for her dry gangrene wounds.  She had a right lower extremity venous ultrasound performed which was negative for DVT on 06/10/2017.  Her hemoglobin dropped down to 7.2 g/dL while inpatient, and she was given 2 units PRBC transfusion.  On the day of discharge on 06/11/2017 her hemoglobin was 8.6 g/dL, hematocrit 28.8%, WBC 7.7K, platelet count 297K. She continues to take her hydrea 1000 mg PO daily.   Patient states that she has been doing fair.  She continues to have swelling her right lower extremity.  Her appetite is okay.  She denies any chest pain, shortness breath, abdominal pain, nausea, vomiting, diarrhea.  REVIEW OF SYSTEMS:  Review of Systems  Constitutional: Negative for chills, fever, malaise/fatigue and weight loss.  HENT: Negative.   Eyes: Negative.   Respiratory: Negative for cough, hemoptysis, sputum production and shortness of breath.   Cardiovascular: Negative for chest pain and leg swelling.       RLE swelling  Gastrointestinal: Negative.  Negative for  blood in stool and melena.  Genitourinary: Negative.  Negative for dysuria and hematuria.  Musculoskeletal: Negative.   Skin: Negative.  Negative for rash.  Neurological: Negative.   Endo/Heme/Allergies: Negative.   Psychiatric/Behavioral: Negative.     PAST MEDICAL/SURGICAL HISTORY:  Past Medical History:  Diagnosis Date  . Anxiety   . Arthritis   . Atrial fibrillation, chronic (HCC)    a. on Coumadin for anticoagulation.   . CHF (congestive heart failure) ([place])    combined  . COPD (chronic obstructive pulmonary disease) ([place])    on home O2  . Deaf   . 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 DVT (deep venous thrombosis) ([place])   . Essential hypertension   . GERD (gastroesophageal reflux disease)   . Hyperlipidemia   . Hypothyroidism   . Peripheral neuropathy   . Peripheral vascular disease ([place])   . 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 Ulcer of ankle ([place])    Bilateral 2015  . Varicose veins     Past Surgical History:  Procedure Laterality Date  . ABDOMINAL AORTAGRAM N/A 09/14/2012   Performed by Serafina Mitchell, MD at [facility] CATH LAB  . ABDOMINAL AORTOGRAM N/A 03/06/2017   Performed by Elam Dutch, MD at [place] CV LAB  . ABDOMINAL HYSTERECTOMY    . ANGIOGRAM EXTREMITY BILATERAL Bilateral 09/14/2012   Performed by Serafina Mitchell, MD at [facility] CATH LAB  . BYPASS GRAFT FEMORAL-POPLITEAL ARTERY Right 09/15/2012   Performed by Mal Misty, MD at [facility]  . CANNULATED  HIP PINNING Right 11/19/2011   Performed by Sanjuana Kava, MD at [facility] ORS  . CORONARY ANGIOPLASTY    . DRESSING CHANGE UNDER ANESTHESIA Right 12/02/2011   Performed by Carole Civil, MD at [facility] ORS  . ENDARTERECTOMY FEMORAL Right 09/15/2012   Performed by Mal Misty, MD at [place]  . FASCIOTOMY Right 11/29/2011   Performed by Carole Civil, MD at [facility] ORS  . FASCIOTOMY CLOSURE Right 12/02/2011   Performed by Carole Civil, MD at [facility] ORS  . Lower Extremity Angiography Bilateral 03/06/2017    Performed by Elam Dutch, MD at [place] CV LAB  . PATCH ANGIOPLASTY Right 09/15/2012   Performed by Mal Misty, MD at [facility] OR  . RIGHT FEMORAL-TIBIAL BYPASS IN-SITU GREATER SAPHENOUS VEIN Right 03/11/2017   Performed by Waynetta Sandy, MD at [facility]  . TUBAL LIGATION      FAMILY/SOCIAL HISTORY:  Family History  Problem Relation Age of Onset  . Heart failure Mother   . Heart disease Mother   . Stroke Father   . Heart disease Sister   . Diabetes Son   . Hypertension Sister   . Hypertension Sister   . Stroke Sister     Social History   Socioeconomic History  . Marital status: Married    Spouse name: None  . Number of children: None  . Years of education: None  . Highest education level: None  Social Needs  . Financial resource strain: None  . Food insecurity - worry: None  . Food insecurity - inability: None  . Transportation needs - medical: None  . Transportation needs - non-medical: None  Occupational History  . None  Tobacco Use  . Smoking status: Never Smoker  . Smokeless tobacco: Never Used  Substance and Sexual Activity  . Alcohol use: No    Alcohol/week: 0.0 oz  . Drug use: No  . Sexual activity: Not Currently  Other Topics Concern  . None  Social History Narrative  . None     CURRENT MEDICATIONS:  Outpatient Encounter Medications as of 06/22/2017  Medication Sig Note  . acetaminophen (TYLENOL) 325 MG tablet Take 2 tablets (650 mg total) by mouth every 6 (six) hours as needed for mild pain or fever.   Marland Kitchen aspirin EC 81 MG EC tablet Take 1 tablet (81 mg total) by mouth daily.   Marland Kitchen diltiazem (CARDIZEM CD) 240 MG 24 hr capsule Take 240 mg by mouth daily.   . furosemide (LASIX) 40 MG tablet Take 1 tablet (40 mg total) by mouth every other day. (Patient taking differently: Take 40-80 mg every other day by mouth. ) 06/08/2017: Spouse/patient presented an 80mg  and 40mg  tablet and states that the two strengths are alternated every other day (40mg ,  80mg , 40mg , etc)   . gabapentin (NEURONTIN) 100 MG capsule Take 100 mg by mouth 3 (three) times daily.     . hydroxyurea (HYDREA) 500 MG capsule Take 2 capsules (1,000 mg total) by mouth daily. May take with food to minimize GI side effects.   Marland Kitchen ibuprofen (ADVIL,MOTRIN) 200 MG tablet Take 400 mg every 6 (six) hours as needed by mouth for mild pain or moderate pain.   Marland Kitchen levothyroxine (SYNTHROID, LEVOTHROID) 75 MCG tablet Take 1 tablet (75 mcg total) by mouth daily before breakfast.   . metolazone (ZAROXOLYN) 2.5 MG tablet Take 2.5 mg by mouth every Monday, Wednesday, and Friday.   . pravastatin (PRAVACHOL) 10 MG tablet Take 1  tablet (10 mg total) by mouth daily. (Patient taking differently: Take 10 mg every evening by mouth. )   . vancomycin 500 mg in sodium chloride 0.9 % 100 mL Inject 500 mg into the vein daily. 06/08/2017: To be taken until 06/12/2017 then follow up with MD   . warfarin (COUMADIN) 2.5 MG tablet Take 1 tablet (2.5 mg total) by mouth daily at 6 PM. Take one tablet all days except take 2.5mg  on MWF 06/08/2017: Has not taken since 06/04/2017. Not to take until follow up    No facility-administered encounter medications on file as of 06/22/2017.     ALLERGIES:  No Known Allergies  PHYSICAL EXAMINATION  ECOG PERFORMANCE STATUS: 1-2   Vitals:   06/22/17 0922  BP: (!) 107/54  Pulse: 65  Resp: 16  SpO2: 95%     Constitutional: Well-developed, well-nourished, and in no distress.  On 3L oxygen via [place]. HENT:  Head: Normocephalic and atraumatic.  Mouth/Throat: No oropharyngeal exudate. Mucosa moist. Eyes: Pupils are equal, round, and reactive to light. Conjunctivae are normal. No scleral icterus.  Neck: Normal range of motion. Neck supple. No JVD present.  Cardiovascular: Normal rate, regular rhythm and normal heart sounds.  Exam reveals no gallop and no friction rub.   No murmur heard. Pulmonary/Chest: Effort normal and breath sounds normal. No respiratory distress. No  wheezes.No rales.  Abdominal: Soft. Bowel sounds are normal. No distension. There is no tenderness. There is no guarding.  Musculoskeletal: 2+ edema in her right foot.  Lymphadenopathy:    No cervical or supraclavicular adenopathy.  Neurological: Alert and oriented to person, place, and time. No cranial nerve deficit.  Skin: Skin is warm and dry. No rash noted. No erythema. No pallor.  Psychiatric: Affect and judgment normal.    LABORATORY DATA: CBC    Component Value Date/Time   WBC 7.7 06/11/2017 0525   RBC 3.46 (L) 06/11/2017 0525   HGB 8.6 (L) 06/11/2017 0525   HCT 28.8 (L) 06/11/2017 0525   PLT 297 06/11/2017 0525   MCV 83.2 06/11/2017 0525   MCH 24.9 (L) 06/11/2017 0525   MCHC 29.9 (L) 06/11/2017 0525   RDW 21.5 (H) 06/11/2017 0525   LYMPHSABS 0.6 (L) 06/09/2017 0610   MONOABS 0.2 06/09/2017 0610   EOSABS 0.2 06/09/2017 0610   BASOSABS 0.4 (H) 06/09/2017 0610       Chemistry      Component Value Date/Time   NA 140 06/09/2017 0610   K 3.4 (L) 06/09/2017 0610   CL 108 06/09/2017 0610   CO2 22 06/09/2017 0610   BUN 36 (H) 06/09/2017 0610   CREATININE 1.72 (H) 06/09/2017 0610   CREATININE 1.21 (H) 08/14/2016 0733      Component Value Date/Time   CALCIUM 7.9 (L) 06/09/2017 0610   ALKPHOS 82 06/09/2017 0610   AST 14 (L) 06/09/2017 0610   ALT 9 (L) 06/09/2017 0610   BILITOT 0.8 06/09/2017 0610        PENDING LABS:   RADIOGRAPHIC STUDIES:  Dg Chest Port 1 View  Result Date: 06/09/2017 CLINICAL DATA:  PICC line placement EXAM: PORTABLE CHEST 1 VIEW COMPARISON:  05/28/2017 FINDINGS: Left upper extremity catheter tip overlies the SVC. Small right and small moderate left pleural effusions, likely layering and causing hazy opacity of the lower lung zones. Bibasilar consolidations. Cardiomegaly with vascular congestion and mild pulmonary edema. IMPRESSION: 1. Left upper extremity catheter tip overlies the SVC 2. Increasing bilateral likely layering pleural effusions  with bibasilar atelectasis or  pneumonia 3. Cardiomegaly with vascular congestion and mild pulmonary edema Electronically Signed   By: Donavan Foil M.D.   On: 06/09/2017 23:46   Dg Chest Portable 1 View  Result Date: 05/28/2017 CLINICAL DATA:  Central catheter placement.  Hypotension. EXAM: PORTABLE CHEST 1 VIEW COMPARISON:  March 28, 2017 FINDINGS: Central catheter tip is in the superior vena cava. No pneumothorax. There is patchy airspace opacity in the medial left lung base. There are minimal pleural effusions bilaterally. Lungs elsewhere are clear. There is borderline cardiomegaly with mild pulmonary venous hypertension. No adenopathy. No bone lesions. IMPRESSION: Central catheter tip in superior vena cava. No pneumothorax. Pulmonary vascular congestion with small pleural effusions bilaterally. Medial airspace consolidation in the left base is likely due to superimposed pneumonia. Followup PA and lateral chest radiographs recommended in 3-4 weeks following trial of antibiotic therapy to ensure resolution and exclude underlying malignancy. Electronically Signed   By: Lowella Grip III M.D.   On: 05/28/2017 15:48     PATHOLOGY:    ASSESSMENT AND PLAN:  Ms. Gionfriddo is pleasant 76 y.o. female with JAK2+ polycythemia vera. Also h/o chronic A-fib on anticoagulation with coumadin.   Polycythemia vera:  -Based on her last hemoglobin and platelet count, patient's PV is controlled right now. She was anemic, so we will need to see if we have to decrease her hydrea dose. Continue hydrea 1000mg  PO daily. -She did not get labs prior to this visit. Therefore we will get labs today and on her next visit. -RTC in 3 months for follow up.  ORDERS PLACED FOR THIS ENCOUNTER: Orders Placed This Encounter  Procedures  . CBC with Differential  . Comprehensive metabolic panel    THERAPY PLAN:  Continue with Hydrea   All questions were answered. The patient knows to call the clinic with any problems,  questions or concerns. We can certainly see the patient much sooner if necessary.  Twana First, MD

## 2017-06-24 DIAGNOSIS — I5042 Chronic combined systolic (congestive) and diastolic (congestive) heart failure: Secondary | ICD-10-CM | POA: Diagnosis not present

## 2017-06-24 DIAGNOSIS — T8140XD Infection following a procedure, unspecified, subsequent encounter: Secondary | ICD-10-CM | POA: Diagnosis not present

## 2017-06-24 DIAGNOSIS — B9689 Other specified bacterial agents as the cause of diseases classified elsewhere: Secondary | ICD-10-CM | POA: Diagnosis not present

## 2017-06-24 DIAGNOSIS — L03115 Cellulitis of right lower limb: Secondary | ICD-10-CM | POA: Diagnosis not present

## 2017-06-24 DIAGNOSIS — I110 Hypertensive heart disease with heart failure: Secondary | ICD-10-CM | POA: Diagnosis not present

## 2017-06-24 DIAGNOSIS — L89890 Pressure ulcer of other site, unstageable: Secondary | ICD-10-CM | POA: Diagnosis not present

## 2017-06-26 DIAGNOSIS — L03115 Cellulitis of right lower limb: Secondary | ICD-10-CM | POA: Diagnosis not present

## 2017-06-26 DIAGNOSIS — L89890 Pressure ulcer of other site, unstageable: Secondary | ICD-10-CM | POA: Diagnosis not present

## 2017-06-26 DIAGNOSIS — I5042 Chronic combined systolic (congestive) and diastolic (congestive) heart failure: Secondary | ICD-10-CM | POA: Diagnosis not present

## 2017-06-26 DIAGNOSIS — B9689 Other specified bacterial agents as the cause of diseases classified elsewhere: Secondary | ICD-10-CM | POA: Diagnosis not present

## 2017-06-26 DIAGNOSIS — T8140XD Infection following a procedure, unspecified, subsequent encounter: Secondary | ICD-10-CM | POA: Diagnosis not present

## 2017-06-26 DIAGNOSIS — I110 Hypertensive heart disease with heart failure: Secondary | ICD-10-CM | POA: Diagnosis not present

## 2017-06-27 ENCOUNTER — Emergency Department (HOSPITAL_COMMUNITY): Payer: Medicare Other

## 2017-06-27 ENCOUNTER — Inpatient Hospital Stay (HOSPITAL_COMMUNITY)
Admission: EM | Admit: 2017-06-27 | Discharge: 2017-06-30 | DRG: 638 | Disposition: A | Discharge status: Home Health | Payer: Medicare Other | Attending: Internal Medicine | Admitting: Internal Medicine

## 2017-06-27 ENCOUNTER — Other Ambulatory Visit: Payer: Self-pay

## 2017-06-27 ENCOUNTER — Encounter (HOSPITAL_COMMUNITY): Payer: Self-pay | Admitting: Cardiology

## 2017-06-27 DIAGNOSIS — M868X8 Other osteomyelitis, other site: Secondary | ICD-10-CM | POA: Diagnosis present

## 2017-06-27 DIAGNOSIS — Z7982 Long term (current) use of aspirin: Secondary | ICD-10-CM | POA: Diagnosis not present

## 2017-06-27 DIAGNOSIS — N183 Chronic kidney disease, stage 3 unspecified: Secondary | ICD-10-CM

## 2017-06-27 DIAGNOSIS — I509 Heart failure, unspecified: Secondary | ICD-10-CM

## 2017-06-27 DIAGNOSIS — M79661 Pain in right lower leg: Secondary | ICD-10-CM | POA: Diagnosis not present

## 2017-06-27 DIAGNOSIS — Z66 Do not resuscitate: Secondary | ICD-10-CM | POA: Diagnosis present

## 2017-06-27 DIAGNOSIS — Z95828 Presence of other vascular implants and grafts: Secondary | ICD-10-CM | POA: Diagnosis not present

## 2017-06-27 DIAGNOSIS — L89610 Pressure ulcer of right heel, unstageable: Secondary | ICD-10-CM | POA: Diagnosis present

## 2017-06-27 DIAGNOSIS — Z9981 Dependence on supplemental oxygen: Secondary | ICD-10-CM

## 2017-06-27 DIAGNOSIS — I5032 Chronic diastolic (congestive) heart failure: Secondary | ICD-10-CM | POA: Diagnosis not present

## 2017-06-27 DIAGNOSIS — M868X7 Other osteomyelitis, ankle and foot: Secondary | ICD-10-CM

## 2017-06-27 DIAGNOSIS — J9611 Chronic respiratory failure with hypoxia: Secondary | ICD-10-CM | POA: Diagnosis not present

## 2017-06-27 DIAGNOSIS — I96 Gangrene, not elsewhere classified: Secondary | ICD-10-CM | POA: Diagnosis present

## 2017-06-27 DIAGNOSIS — T8189XA Other complications of procedures, not elsewhere classified, initial encounter: Secondary | ICD-10-CM | POA: Diagnosis not present

## 2017-06-27 DIAGNOSIS — M869 Osteomyelitis, unspecified: Secondary | ICD-10-CM | POA: Diagnosis present

## 2017-06-27 DIAGNOSIS — D649 Anemia, unspecified: Secondary | ICD-10-CM | POA: Diagnosis not present

## 2017-06-27 DIAGNOSIS — I10 Essential (primary) hypertension: Secondary | ICD-10-CM

## 2017-06-27 DIAGNOSIS — I130 Hypertensive heart and chronic kidney disease with heart failure and stage 1 through stage 4 chronic kidney disease, or unspecified chronic kidney disease: Secondary | ICD-10-CM | POA: Diagnosis present

## 2017-06-27 DIAGNOSIS — I999 Unspecified disorder of circulatory system: Secondary | ICD-10-CM | POA: Diagnosis not present

## 2017-06-27 DIAGNOSIS — E1152 Type 2 diabetes mellitus with diabetic peripheral angiopathy with gangrene: Secondary | ICD-10-CM | POA: Diagnosis present

## 2017-06-27 DIAGNOSIS — L02415 Cutaneous abscess of right lower limb: Secondary | ICD-10-CM | POA: Diagnosis not present

## 2017-06-27 DIAGNOSIS — H9193 Unspecified hearing loss, bilateral: Secondary | ICD-10-CM

## 2017-06-27 DIAGNOSIS — I82511 Chronic embolism and thrombosis of right femoral vein: Secondary | ICD-10-CM | POA: Diagnosis present

## 2017-06-27 DIAGNOSIS — I5040 Unspecified combined systolic (congestive) and diastolic (congestive) heart failure: Secondary | ICD-10-CM | POA: Diagnosis not present

## 2017-06-27 DIAGNOSIS — J449 Chronic obstructive pulmonary disease, unspecified: Secondary | ICD-10-CM | POA: Diagnosis present

## 2017-06-27 DIAGNOSIS — H9190 Unspecified hearing loss, unspecified ear: Secondary | ICD-10-CM | POA: Diagnosis present

## 2017-06-27 DIAGNOSIS — L03115 Cellulitis of right lower limb: Secondary | ICD-10-CM | POA: Diagnosis present

## 2017-06-27 DIAGNOSIS — Z7901 Long term (current) use of anticoagulants: Secondary | ICD-10-CM | POA: Diagnosis not present

## 2017-06-27 DIAGNOSIS — E785 Hyperlipidemia, unspecified: Secondary | ICD-10-CM | POA: Diagnosis present

## 2017-06-27 DIAGNOSIS — E038 Other specified hypothyroidism: Secondary | ICD-10-CM

## 2017-06-27 DIAGNOSIS — K219 Gastro-esophageal reflux disease without esophagitis: Secondary | ICD-10-CM | POA: Diagnosis present

## 2017-06-27 DIAGNOSIS — M7989 Other specified soft tissue disorders: Secondary | ICD-10-CM | POA: Diagnosis not present

## 2017-06-27 DIAGNOSIS — I739 Peripheral vascular disease, unspecified: Secondary | ICD-10-CM | POA: Diagnosis not present

## 2017-06-27 DIAGNOSIS — E1169 Type 2 diabetes mellitus with other specified complication: Principal | ICD-10-CM | POA: Diagnosis present

## 2017-06-27 DIAGNOSIS — M79671 Pain in right foot: Secondary | ICD-10-CM

## 2017-06-27 DIAGNOSIS — E876 Hypokalemia: Secondary | ICD-10-CM | POA: Diagnosis not present

## 2017-06-27 DIAGNOSIS — I482 Chronic atrial fibrillation, unspecified: Secondary | ICD-10-CM | POA: Diagnosis present

## 2017-06-27 DIAGNOSIS — N2889 Other specified disorders of kidney and ureter: Secondary | ICD-10-CM | POA: Diagnosis not present

## 2017-06-27 DIAGNOSIS — L03119 Cellulitis of unspecified part of limb: Secondary | ICD-10-CM | POA: Diagnosis not present

## 2017-06-27 DIAGNOSIS — E039 Hypothyroidism, unspecified: Secondary | ICD-10-CM | POA: Diagnosis present

## 2017-06-27 DIAGNOSIS — T8131XA Disruption of external operation (surgical) wound, not elsewhere classified, initial encounter: Secondary | ICD-10-CM | POA: Diagnosis present

## 2017-06-27 DIAGNOSIS — R7881 Bacteremia: Secondary | ICD-10-CM | POA: Diagnosis not present

## 2017-06-27 HISTORY — DX: Cellulitis, unspecified: L03.90

## 2017-06-27 LAB — BASIC METABOLIC PANEL
Anion gap: 9 (ref 5–15)
BUN: 33 mg/dL — AB (ref 6–20)
CO2: 30 mmol/L (ref 22–32)
CREATININE: 1.37 mg/dL — AB (ref 0.44–1.00)
Calcium: 8.6 mg/dL — ABNORMAL LOW (ref 8.9–10.3)
Chloride: 103 mmol/L (ref 101–111)
GFR, EST AFRICAN AMERICAN: 43 mL/min — AB (ref >60)
GFR, EST NON AFRICAN AMERICAN: 37 mL/min — AB (ref >60)
Glucose, Bld: 89 mg/dL (ref 65–99)
POTASSIUM: 3.3 mmol/L — AB (ref 3.5–5.1)
SODIUM: 142 mmol/L (ref 135–145)

## 2017-06-27 LAB — CBC WITH DIFFERENTIAL/PLATELET
BASOS PCT: 1 %
Basophils Absolute: 0.2 10*3/uL — ABNORMAL HIGH (ref 0.0–0.1)
EOS ABS: 0.2 10*3/uL (ref 0.0–0.7)
EOS PCT: 2 %
HCT: 31.8 % — ABNORMAL LOW (ref 36.0–46.0)
Hemoglobin: 9.1 g/dL — ABNORMAL LOW (ref 12.0–15.0)
LYMPHS ABS: 1 10*3/uL (ref 0.7–4.0)
Lymphocytes Relative: 8 %
MCH: 25.9 pg — AB (ref 26.0–34.0)
MCHC: 28.6 g/dL — AB (ref 30.0–36.0)
MCV: 90.6 fL (ref 78.0–100.0)
Monocytes Absolute: 0.2 10*3/uL (ref 0.1–1.0)
Monocytes Relative: 2 %
NEUTROS PCT: 87 %
Neutro Abs: 11 10*3/uL — ABNORMAL HIGH (ref 1.7–7.7)
PLATELETS: 497 10*3/uL — AB (ref 150–400)
RBC: 3.51 MIL/uL — AB (ref 3.87–5.11)
RDW: 25.9 % — ABNORMAL HIGH (ref 11.5–15.5)
WBC: 12.6 10*3/uL — AB (ref 4.0–10.5)

## 2017-06-27 LAB — I-STAT CG4 LACTIC ACID, ED: LACTIC ACID, VENOUS: 1.44 mmol/L (ref 0.5–1.9)

## 2017-06-27 LAB — PROTIME-INR
INR: 2.64
PROTHROMBIN TIME: 28 s — AB (ref 11.4–15.2)

## 2017-06-27 MED ORDER — PIPERACILLIN-TAZOBACTAM 3.375 G IVPB 30 MIN
3.3750 g | Freq: Once | INTRAVENOUS | Status: AC
  Administered 2017-06-27: 3.375 g via INTRAVENOUS
  Filled 2017-06-27: qty 50

## 2017-06-27 MED ORDER — FUROSEMIDE 40 MG PO TABS: 40.0000 mg | ORAL_TABLET | Freq: Every day | ORAL | Status: DC

## 2017-06-27 MED ORDER — WARFARIN - PHARMACIST DOSING INPATIENT
Freq: Every day | Status: DC
  Administered 2017-06-28: 18:00:00

## 2017-06-27 MED ORDER — GABAPENTIN 100 MG PO CAPS
100.0000 mg | ORAL_CAPSULE | Freq: Three times a day (TID) | ORAL | Status: DC
  Administered 2017-06-27 – 2017-06-30 (×8): 100 mg via ORAL
  Filled 2017-06-27 (×9): qty 1

## 2017-06-27 MED ORDER — ONDANSETRON HCL 4 MG/2ML IJ SOLN
4.0000 mg | Freq: Four times a day (QID) | INTRAMUSCULAR | Status: DC | PRN
  Administered 2017-06-27: 4 mg via INTRAVENOUS
  Filled 2017-06-27: qty 2

## 2017-06-27 MED ORDER — VANCOMYCIN HCL IN DEXTROSE 750-5 MG/150ML-% IV SOLN
750.0000 mg | INTRAVENOUS | Status: DC
  Administered 2017-06-28 – 2017-06-29 (×2): 750 mg via INTRAVENOUS
  Filled 2017-06-27 (×2): qty 150

## 2017-06-27 MED ORDER — HYDROXYUREA 500 MG PO CAPS
1000.0000 mg | ORAL_CAPSULE | Freq: Every day | ORAL | Status: DC
  Administered 2017-06-28 – 2017-06-30 (×3): 1000 mg via ORAL
  Filled 2017-06-27 (×3): qty 2

## 2017-06-27 MED ORDER — LORAZEPAM 2 MG/ML IJ SOLN: 1.0000 mg | Freq: Once | INTRAMUSCULAR | Status: DC

## 2017-06-27 MED ORDER — ACETAMINOPHEN 325 MG PO TABS
650.0000 mg | ORAL_TABLET | Freq: Four times a day (QID) | ORAL | Status: DC | PRN
  Administered 2017-06-27 – 2017-06-29 (×2): 650 mg via ORAL
  Filled 2017-06-27 (×2): qty 2

## 2017-06-27 MED ORDER — MORPHINE SULFATE (PF) 4 MG/ML IV SOLN
4.0000 mg | Freq: Once | INTRAVENOUS | Status: AC
  Administered 2017-06-27: 4 mg via INTRAVENOUS

## 2017-06-27 MED ORDER — MORPHINE SULFATE (PF) 2 MG/ML IV SOLN
INTRAVENOUS | Status: AC
  Filled 2017-06-27: qty 2

## 2017-06-27 MED ORDER — ASPIRIN EC 81 MG PO TBEC
81.0000 mg | DELAYED_RELEASE_TABLET | Freq: Every day | ORAL | Status: DC
  Administered 2017-06-28 – 2017-06-30 (×3): 81 mg via ORAL
  Filled 2017-06-27 (×3): qty 1

## 2017-06-27 MED ORDER — ACETAMINOPHEN 650 MG RE SUPP: 650.0000 mg | Freq: Four times a day (QID) | RECTAL | Status: DC | PRN

## 2017-06-27 MED ORDER — PIPERACILLIN-TAZOBACTAM 3.375 G IVPB
3.3750 g | Freq: Three times a day (TID) | INTRAVENOUS | Status: DC
  Administered 2017-06-28 – 2017-06-29 (×5): 3.375 g via INTRAVENOUS
  Filled 2017-06-27 (×6): qty 50

## 2017-06-27 MED ORDER — DILTIAZEM HCL ER COATED BEADS 240 MG PO CP24
240.0000 mg | ORAL_CAPSULE | Freq: Every day | ORAL | Status: DC
  Administered 2017-06-28 – 2017-06-30 (×3): 240 mg via ORAL
  Filled 2017-06-27 (×3): qty 1

## 2017-06-27 MED ORDER — VANCOMYCIN HCL IN DEXTROSE 1-5 GM/200ML-% IV SOLN
1000.0000 mg | Freq: Once | INTRAVENOUS | Status: AC
  Administered 2017-06-27: 1000 mg via INTRAVENOUS
  Filled 2017-06-27: qty 200

## 2017-06-27 MED ORDER — POTASSIUM CHLORIDE CRYS ER 20 MEQ PO TBCR
40.0000 meq | EXTENDED_RELEASE_TABLET | Freq: Once | ORAL | Status: AC
  Administered 2017-06-27: 40 meq via ORAL
  Filled 2017-06-27: qty 2

## 2017-06-27 MED ORDER — FUROSEMIDE 40 MG PO TABS
40.0000 mg | ORAL_TABLET | ORAL | Status: DC
  Administered 2017-06-28 – 2017-06-30 (×2): 40 mg via ORAL
  Filled 2017-06-27 (×3): qty 1

## 2017-06-27 MED ORDER — WARFARIN SODIUM 5 MG PO TABS
2.5000 mg | ORAL_TABLET | ORAL | Status: AC
  Administered 2017-06-27: 2.5 mg via ORAL
  Filled 2017-06-27: qty 1

## 2017-06-27 MED ORDER — IBUPROFEN 400 MG PO TABS: 400.0000 mg | ORAL_TABLET | Freq: Four times a day (QID) | ORAL | Status: DC | PRN

## 2017-06-27 MED ORDER — LORAZEPAM 2 MG/ML IJ SOLN: 0.5000 mg | INTRAMUSCULAR | Status: DC | PRN

## 2017-06-27 MED ORDER — OXYCODONE HCL 5 MG PO TABS
5.0000 mg | ORAL_TABLET | ORAL | Status: DC | PRN
  Administered 2017-06-28: 5 mg via ORAL
  Filled 2017-06-27: qty 1

## 2017-06-27 MED ORDER — PRAVASTATIN SODIUM 10 MG PO TABS
10.0000 mg | ORAL_TABLET | Freq: Every evening | ORAL | Status: DC
  Administered 2017-06-27 – 2017-06-29 (×3): 10 mg via ORAL
  Filled 2017-06-27 (×3): qty 1

## 2017-06-27 MED ORDER — ONDANSETRON HCL 4 MG PO TABS: 4.0000 mg | ORAL_TABLET | Freq: Four times a day (QID) | ORAL | Status: DC | PRN

## 2017-06-27 MED ORDER — METOLAZONE 2.5 MG PO TABS
2.5000 mg | ORAL_TABLET | ORAL | Status: DC
  Administered 2017-06-29: 2.5 mg via ORAL
  Filled 2017-06-27: qty 1

## 2017-06-27 MED ORDER — LEVOTHYROXINE SODIUM 75 MCG PO TABS
75.0000 ug | ORAL_TABLET | Freq: Every day | ORAL | Status: DC
  Administered 2017-06-28 – 2017-06-30 (×3): 75 ug via ORAL
  Filled 2017-06-27 (×3): qty 1

## 2017-06-27 NOTE — Progress Notes (Signed)
Pharmacy Antibiotic Note  Charlotte [name] is a 76 y.o. female admitted on 06/27/2017 with cellulitis.  Pharmacy has been consulted for Vancomycin dosing.  Plan: Zosyn 3.375 grams iv Q 8 hours - 4 hr infusion Vancomycin 750 mg iv Q 24 hours  Height: 6' (182.9 cm) Weight: 162 lb (73.5 kg) IBW/kg (Calculated) : 73.1  Temp (24hrs), Avg:97.8 F (36.6 C), Min:97.8 F (36.6 C), Max:97.8 F (36.6 C)  Recent Labs  Lab 06/22/17 0954 06/27/17 1742 06/27/17 2103  WBC 13.0* 12.6*  --   CREATININE 1.58* 1.37*  --   LATICACIDVEN  --   --  1.44    Estimated Creatinine Clearance: 41.6 mL/min (A) (by C-G formula based on SCr of 1.37 mg/dL (H)).    No Known Allergies    Thank you for allowing pharmacy to be a part of this patient's care.  Tad Moore 06/27/2017 10:36 PM

## 2017-06-27 NOTE — Progress Notes (Signed)
ANTICOAGULATION CONSULT NOTE - Follow Up Consult  Pharmacy Consult for Warfarin Indication: atrial fibrillation  No Known Allergies  Patient Measurements: Height: 6' (182.9 cm) Weight: 162 lb (73.5 kg) IBW/kg (Calculated) : 73.1  Vital Signs: Temp: 97.8 F (36.6 C) (11/24 1715) Temp Source: Oral (11/24 1715) BP: 115/76 (11/24 2130) Pulse Rate: 91 (11/24 2130)  Labs: Recent Labs    06/27/17 1742  HGB 9.1*  HCT 31.8*  PLT 497*  LABPROT 28.0*  INR 2.64  CREATININE 1.37*    Estimated Creatinine Clearance: 41.6 mL/min (A) (by C-G formula based on SCr of 1.37 mg/dL (H)).   Assessment: 76 year old female on warfarin prior to admission for Afib INR therapeutic on admission Dose PTA = 5 mg on Mondays, 2.5 mg other days Last dose 11/23  Goal of Therapy:  INR 2-3 Monitor platelets by anticoagulation protocol: Yes   Plan:  Warfarin 2.5 mg po x 1 tonight Daily INR  Thank you Anette Guarneri, PharmD [phone]  06/27/2017,10:27 PM

## 2017-06-27 NOTE — ED Triage Notes (Signed)
Fem Pop bypass right leg in August. Pt c/o right foot  pain for 2 weeks.  Right foot red and swollen.  Pt has 3 toes on right foot that are black.

## 2017-06-27 NOTE — ED Provider Notes (Signed)
[facility] EMERGENCY DEPARTMENT Provider Note   CSN: [id number] Arrival date & time: 06/27/17  1709     History   Chief Complaint Chief Complaint  Patient presents with  . Claudication    HPI Charlotte [name] is a 76 y.o. female.  Planes of right foot pain progressively worsening over several weeks but becoming worse over the past 2 weeks pain is not made better or worse by anything.  She is treated herself with ibuprofen without any relief.  No injury.  No vomiting no fever no other associated symptoms.  HPI  Past Medical History:  Diagnosis Date  . Anxiety   . Arthritis   . Atrial fibrillation, chronic (HCC)    a. on Coumadin for anticoagulation.   . CHF (congestive heart failure) ([place])    combined  . COPD (chronic obstructive pulmonary disease) ([place])    on home O2  . Deaf   . 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 DVT (deep venous thrombosis) ([place])   . Essential hypertension   . GERD (gastroesophageal reflux disease)   . Hyperlipidemia   . Hypothyroidism   . Peripheral neuropathy   . Peripheral vascular disease ([place])   . 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 Ulcer of ankle ([place])    Bilateral 2015  . Varicose veins     Patient Active Problem List   Diagnosis Date Noted  . S/P femoral-popliteal bypass surgery   . Edema of right lower extremity 06/08/2017  . GERD (gastroesophageal reflux disease) 06/08/2017  . Pressure ulcer 05/29/2017  . Sepsis ([place]) 05/28/2017  . Hypotension 05/28/2017  . Pressure injury of skin 03/14/2017  . Acute renal failure (ARF) ([place])   . Hyperlipidemia 03/05/2017  . Lower extremity arterial insufficiency, severe, right ([place]) 03/04/2017  . Chronic respiratory failure with hypoxia ([place]) 02/12/2017  . CAP (community acquired pneumonia) 02/12/2017  . Cellulitis 07/03/2016  . Anemia 07/03/2016  . Cough 11/29/2015  . Hypoxemia 11/29/2015  . Community acquired pneumonia 11/29/2015  . CHF (congestive heart failure) ([place]) 11/29/2015  .  Acute on chronic respiratory failure with hypoxia ([place])   . Pleural effusion on left   . Acute on chronic diastolic (congestive) heart failure ([place])   . Dyspnea 11/22/2014  . Pleural effusion 11/22/2014  . Hard of hearing 11/22/2014  . Thrombocytosis ([place]) 11/22/2014  . Acute respiratory failure with hypoxemia ([place]) 11/22/2014  . Encounter for therapeutic drug monitoring 08/31/2013  . Aftercare following surgery of the circulatory system, [place] 12/21/2012  . Pain in limb 10/19/2012  . Peripheral vascular disease ([place]) 09/24/2012  . Swollen leg 09/24/2012  . Varicose veins of lower extremities with other complications 66/44/0347  . 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 Wound infection after surgery 01/07/2012  . Compartment syndrome, nontraumatic, lower extremity 11/29/2011  . Pain of right thigh 11/28/2011  . Hip fracture, right ([place]) 11/15/2011  . Chronic atrial fibrillation ([place]) 11/15/2011  . Anticoagulant long-term use 11/15/2011  . HTN (hypertension) 11/15/2011  . Hypothyroidism 11/15/2011  . Polycythemia vera ([place]) 10/01/2011    Past Surgical History:  Procedure Laterality Date  . ABDOMINAL AORTAGRAM N/A 09/14/2012   Procedure: ABDOMINAL [name];  Surgeon: Serafina Mitchell, MD;  Location: [facility] CATH LAB;  Service: Cardiovascular;  Laterality: N/A;  . ABDOMINAL AORTOGRAM N/A 03/06/2017   Procedure: ABDOMINAL AORTOGRAM;  Surgeon: Elam Dutch, MD;  Location: [facility] CV LAB;  Service:  Cardiovascular;  Laterality: N/A;  . ABDOMINAL HYSTERECTOMY    . CORONARY ANGIOPLASTY    . DRESSING CHANGE UNDER ANESTHESIA  12/02/2011   Procedure: DRESSING CHANGE UNDER ANESTHESIA;  Surgeon: Carole Civil, MD;  Location: [facility] ORS;  Service: Orthopedics;  Laterality: Right;  . ENDARTERECTOMY FEMORAL Right 09/15/2012   Procedure: ENDARTERECTOMY FEMORAL;  Surgeon: Mal Misty, MD;  Location: [place] [facility];   Service: Vascular;  Laterality: Right;  . FASCIOTOMY  11/29/2011   Procedure: FASCIOTOMY;  Surgeon: Carole Civil, MD;  Location: [facility];  Service: Orthopedics;  Laterality: Right;  right thigh   . FEMORAL-POPLITEAL BYPASS GRAFT Right 09/15/2012   Procedure: BYPASS GRAFT FEMORAL-POPLITEAL ARTERY;  Surgeon: Mal Misty, MD;  Location: [place];  Service: Vascular;  Laterality: Right;  . FEMORAL-TIBIAL BYPASS GRAFT Right 03/11/2017   Procedure: RIGHT FEMORAL-TIBIAL BYPASS IN-SITU GREATER SAPHENOUS VEIN;  Surgeon: Waynetta Sandy, MD;  Location: [facility];  Service: Vascular;  Laterality: Right;  . HIP PINNING,CANNULATED  11/19/2011   Procedure: CANNULATED HIP PINNING;  Surgeon: Sanjuana Kava, MD;  Location: [facility];  Service: Orthopedics;  Laterality: Right;  . LOWER EXTREMITY ANGIOGRAPHY Bilateral 03/06/2017   Procedure: Lower Extremity Angiography;  Surgeon: Elam Dutch, MD;  Location: [facility] CV LAB;  Service: Cardiovascular;  Laterality: Bilateral;  . PATCH ANGIOPLASTY Right 09/15/2012   Procedure: PATCH ANGIOPLASTY;  Surgeon: Mal Misty, MD;  Location: [place];  Service: Vascular;  Laterality: Right;  . TUBAL LIGATION      OB History    No data available       Home Medications    Prior to Admission medications   Medication Sig Start Date End Date Taking? Authorizing Provider  acetaminophen (TYLENOL) 325 MG tablet Take 2 tablets (650 mg total) by mouth every 6 (six) hours as needed for mild pain or fever. 03/29/17   Cristal Ford, DO  aspirin EC 81 MG EC tablet Take 1 tablet (81 mg total) by mouth daily. 06/02/17   Johnson, Clanford L, MD  diltiazem (CARDIZEM CD) 240 MG 24 hr capsule Take 240 mg by mouth daily.    [provider]  furosemide (LASIX) 40 MG tablet Take 1 tablet (40 mg total) by mouth every other day. Patient taking differently: Take 40-80 mg every other day by mouth.  06/01/17   Johnson, Clanford L, MD  gabapentin (NEURONTIN) 100 MG capsule  Take 100 mg by mouth 3 (three) times daily.      [provider]  hydroxyurea (HYDREA) 500 MG capsule Take 2 capsules (1,000 mg total) by mouth daily. May take with food to minimize GI side effects. 03/03/17   Twana First, MD  ibuprofen (ADVIL,MOTRIN) 200 MG tablet Take 400 mg every 6 (six) hours as needed by mouth for mild pain or moderate pain.    [provider]  levothyroxine (SYNTHROID, LEVOTHROID) 75 MCG tablet Take 1 tablet (75 mcg total) by mouth daily before breakfast. 06/02/17   Murlean Iba, MD  metolazone (ZAROXOLYN) 2.5 MG tablet Take 2.5 mg by mouth every Monday, Wednesday, and Friday.    [provider]  pravastatin (PRAVACHOL) 10 MG tablet Take 1 tablet (10 mg total) by mouth daily. Patient taking differently: Take 10 mg every evening by mouth.  06/01/17   Johnson, Clanford L, MD  vancomycin 500 mg in sodium chloride 0.9 % 100 mL Inject 500 mg into the vein daily.    [provider]  warfarin (COUMADIN) 2.5 MG tablet Take  1 tablet (2.5 mg total) by mouth daily at 6 PM. Take one tablet all days except take 2.5mg  on MWF 06/01/17 06/11/17  Murlean Iba, MD    Family History Family History  Problem Relation Age of Onset  . Heart failure Mother   . Heart disease Mother   . Stroke Father   . Heart disease Sister   . Diabetes Son   . Hypertension Sister   . Hypertension Sister   . Stroke Sister     Social History Social History   Tobacco Use  . Smoking status: Never Smoker  . Smokeless tobacco: Never Used  Substance Use Topics  . Alcohol use: No    Alcohol/week: 0.0 oz  . Drug use: No     Allergies   Patient has no known allergies.   Review of Systems Review of Systems  Musculoskeletal: Positive for arthralgias.       Right foot pain  All other systems reviewed and are negative.    Physical Exam Updated Vital Signs BP 130/67   Pulse 88   Temp 97.8 F (36.6 C) (Oral)   Resp 16   Ht 6' (1.829 m)   Wt 73.5  kg (162 lb)   SpO2 94%   BMI 21.97 kg/m   Physical Exam  Constitutional:  Chronically ill-appearing frail  HENT:  Head: Normocephalic and atraumatic.  Eyes: Conjunctivae are normal. Pupils are equal, round, and reactive to light.  Neck: Neck supple. No tracheal deviation present. No thyromegaly present.  Cardiovascular: Normal rate and regular rhythm.  No murmur heard. Pulmonary/Chest: Effort normal and breath sounds normal.  Abdominal: Soft. Bowel sounds are normal. She exhibits no distension. There is no tenderness.  Musculoskeletal: Normal range of motion. She exhibits no edema or tenderness.  Lower extremiity third and fourth distal toes are gangrenous there is also a gangrenous area at the lateral aspect of the foot.  There is also a shallow ulcer 1 cm in diameter overlying the heel DP and PT pulses obtainable by Doppler.  Popliteal pulse obtainable by Doppler.  Femoral pulses 2+.  There is an open wound to the medial aspect of the calf which appears to be draining yellowish pus.  Leg is diffusely reddened warm and swollen below the knee.  Without tenderness.  Right lower extremity without redness swelling or tenderness.  DP and PT pulses 1+ femoral pulse 2+  Neurological: She is alert. Coordination normal.  Skin: Skin is warm and dry. No rash noted.  Psychiatric: She has a normal mood and affect.  Nursing note and vitals reviewed.   X-rays reviewed by me Results for orders placed or performed during the hospital encounter of 06/27/17  CBC with Differential/Platelet  Result Value Ref Range   WBC 12.6 (H) 4.0 - 10.5 K/uL   RBC 3.51 (L) 3.87 - 5.11 MIL/uL   Hemoglobin 9.1 (L) 12.0 - 15.0 g/dL   HCT 31.8 (L) 36.0 - 46.0 %   MCV 90.6 78.0 - 100.0 fL   MCH 25.9 (L) 26.0 - 34.0 pg   MCHC 28.6 (L) 30.0 - 36.0 g/dL   RDW 25.9 (H) 11.5 - 15.5 %   Platelets 497 (H) 150 - 400 K/uL   Neutrophils Relative % 87 %   Neutro Abs 11.0 (H) 1.7 - 7.7 K/uL   Lymphocytes Relative 8 %   Lymphs  Abs 1.0 0.7 - 4.0 K/uL   Monocytes Relative 2 %   Monocytes Absolute 0.2 0.1 - 1.0 K/uL   Eosinophils  Relative 2 %   Eosinophils Absolute 0.2 0.0 - 0.7 K/uL   Basophils Relative 1 %   Basophils Absolute 0.2 (H) 0.0 - 0.1 K/uL  Basic metabolic panel  Result Value Ref Range   Sodium 142 135 - 145 mmol/L   Potassium 3.3 (L) 3.5 - 5.1 mmol/L   Chloride 103 101 - 111 mmol/L   CO2 30 22 - 32 mmol/L   Glucose, Bld 89 65 - 99 mg/dL   BUN 33 (H) 6 - 20 mg/dL   Creatinine, Ser 1.37 (H) 0.44 - 1.00 mg/dL   Calcium 8.6 (L) 8.9 - 10.3 mg/dL   GFR calc non Af Amer 37 (L) >60 mL/min   GFR calc Af Amer 43 (L) >60 mL/min   Anion gap 9 5 - 15  Protime-INR  Result Value Ref Range   Prothrombin Time 28.0 (H) 11.4 - 15.2 seconds   INR 2.64    Dg Tibia/fibula Right  Result Date: 06/27/2017 CLINICAL DATA:  Pain and soft tissue infection. EXAM: RIGHT TIBIA AND FIBULA - 2 VIEW COMPARISON:  None. FINDINGS: Skin/soft tissue defects of the upper medial aspect of the lower leg. No tracking soft tissue air. Surgical clips but no radiopaque foreign body. Diffuse soft tissue edema of the lower extremity. No acute osseous abnormality or bony destructive change. IMPRESSION: Skin/soft tissue defects of the upper medial leg may reflect ulcers. No tracking soft tissue air, radiopaque foreign body, or underlying osseous abnormality. Electronically Signed   By: Jeb Levering M.D.   On: 06/27/2017 19:15   Dg Chest Port 1 View  Result Date: 06/09/2017 CLINICAL DATA:  PICC line placement EXAM: PORTABLE CHEST 1 VIEW COMPARISON:  05/28/2017 FINDINGS: Left upper extremity catheter tip overlies the SVC. Small right and small moderate left pleural effusions, likely layering and causing hazy opacity of the lower lung zones. Bibasilar consolidations. Cardiomegaly with vascular congestion and mild pulmonary edema. IMPRESSION: 1. Left upper extremity catheter tip overlies the SVC 2. Increasing bilateral likely layering pleural  effusions with bibasilar atelectasis or pneumonia 3. Cardiomegaly with vascular congestion and mild pulmonary edema Electronically Signed   By: Donavan Foil M.D.   On: 06/09/2017 23:46   Dg Foot Complete Right  Result Date: 06/27/2017 CLINICAL DATA:  Pain, soft tissue infection.  Necrotic toes. EXAM: RIGHT FOOT COMPLETE - 3+ VIEW COMPARISON:  None. FINDINGS: Atrophic soft tissues of the distal fourth digit with question of osseous demineralization of the distal phalanx. No tracking soft tissue air. Fifth digit distal phalanx is truncated in a chronic appearance. Soft tissue irregularity noted about the lateral foot adjacent to the distal fifth metatarsal. Degenerative change of the first ray. No fracture or dislocation. Vascular calcifications. Dorsal soft tissue edema. IMPRESSION: Atrophic soft tissues of the distal fourth digit with question of distal phalanx demineralization, possible osteomyelitis. Skin irregularity of the lateral foot about the distal fifth metatarsal without underlying bony changes. Electronically Signed   By: Jeb Levering M.D.   On: 06/27/2017 19:14   ED Treatments / Results  Labs (all labs ordered are listed, but only abnormal results are displayed) Labs Reviewed  CBC WITH DIFFERENTIAL/PLATELET  BASIC METABOLIC PANEL  PROTIME-INR    EKG  EKG Interpretation  Date/Time:  Saturday June 27 2017 17:53:03 EST Ventricular Rate:  82 PR Interval:    QRS Duration: 100 QT Interval:  432 QTC Calculation: 505 R Axis:   83 Text Interpretation:  Atrial flutter with predominant 3:1 AV block Borderline right axis deviation Borderline low  voltage, extremity leads Prolonged QT interval No significant change since last tracing Confirmed by Orlie Dakin [phone]) on 06/27/2017 6:04:50 PM       Radiology No results found.  Procedures Procedures (including critical care time)  Medications Ordered in ED Medications  morphine 4 MG/ML injection 4 mg (not administered)       Initial Impression / Assessment and Plan / ED Course  I have reviewed the triage vital signs and the nursing notes.  Pertinent labs & imaging results that were available during my care of the patient were reviewed by me and considered in my medical decision making (see chart for details).     7:50 PM pain improved after treatment with intravenous morphine and patient resting comfortably. Intravenous antibiotics ordered, as well as oral potassium supplementation.  I discussed case with Dr. Scot Dock, vascular surgeon.  In light of soft tissue infection being in proximity to graft and necrotic areas of foot feel that patient will be best served in [facility].  Dr. Doren Custard is in agreement and requests admission to hospitalist service.  Hospitalist can consult him tomorrow to evaluate patient.  I consulted Dr. Nehemiah Settle hospital service will arrange for admission to [facility].  Patient is a DNR CODE STATUS.  She presented with a DNR certificate and request that the DNR status continue.  I also suggest palliative care consult in [place]  Hospital records reviewed. renal insufficiency, anemia, and thrombocytosis are chronic Final Clinical Impressions(s) / ED Diagnoses  #1 cellulitis of right leg #2 gangrene of right foot #3 hypokalemia Final diagnoses:  None  #4 anemia #5 renal insufficiency #6 thrombocytosis  ED Discharge Orders    None       Orlie Dakin, MD 06/27/17 2011

## 2017-06-27 NOTE — H&P (Addendum)
History and Physical  Charlotte [name] ENI:[id number] DOB: 28-Nov-1940 DOA: 06/27/2017  Referring physician: Dr Winfred Leeds, ED physician PCP: [name], MD  Outpatient Specialists:   Dr [name] (vascular)  Patient Coming From: home  Chief Complaint: foot pain  HPI: Charlotte [name] is a 76 y.o. 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  Patient came to ED for evaluation for foot pain that has been worsening over the past month, but dramatically worse over the past 2 weeks.  She has been taking a lot of ibuprofen secondary to pain.  Pain worse with ambulation, although not worse with pressure.  Ibuprofen not currently helping due to pain.  Her right leg is also sore, but no real pain.  Denies fevers, chills, nausea, vomiting.   Emergency Department Course: .  X-ray of right leg without evidence of osteomyelitis.  There is evidence of osteomyelitis in the right toes   On x-ray.  White count normal.  Review of Systems:   Pt denies any fevers, chills, nausea, vomiting, diarrhea, constipation, abdominal pain, shortness of breath, dyspnea on exertion, orthopnea, cough, wheezing, palpitations, headache, vision changes, lightheadedness, dizziness, melena, rectal bleeding.  Review of systems are otherwise negative  Past Medical History:  Diagnosis Date  . Anxiety   . Arthritis   . Atrial fibrillation, chronic (HCC)    a. on Coumadin for anticoagulation.   . CHF (congestive heart failure) ([facility])    combined  . COPD (chronic obstructive pulmonary disease) ([place])    on home O2  . Deaf   . 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 DVT (deep venous thrombosis) ([place])   . Essential hypertension   . GERD (gastroesophageal reflux disease)   . Hyperlipidemia   . Hypothyroidism   . Peripheral neuropathy    . Peripheral vascular disease ([place])   . 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 Ulcer of ankle ([place])    Bilateral 2015  . Varicose veins    Past Surgical History:  Procedure Laterality Date  . ABDOMINAL AORTAGRAM N/A 09/14/2012   Procedure: ABDOMINAL [name];  Surgeon: Serafina Mitchell, MD;  Location[facility] CATH LAB;  Service: Cardiovascular;  Laterality: N/A;  . ABDOMINAL AORTOGRAM N/A 03/06/2017   Procedure: ABDOMINAL AORTOGRAM;  Surgeon: Elam Dutch, MD;  Location: [place] CV LAB;  Service: Cardiovascular;  Laterality: N/A;  . ABDOMINAL HYSTERECTOMY    . CORONARY ANGIOPLASTY    . DRESSING CHANGE UNDER ANESTHESIA  12/02/2011   Procedure: DRESSING CHANGE UNDER ANESTHESIA;  Surgeon: Carole Civil, MD;  Location: [facility];  Service: Orthopedics;  Laterality: Right;  . ENDARTERECTOMY FEMORAL Right 09/15/2012   Procedure: ENDARTERECTOMY FEMORAL;  Surgeon: Mal Misty, MD;  Location: [place];  Service: Vascular;  Laterality: Right;  . FASCIOTOMY  11/29/2011   Procedure: FASCIOTOMY;  Surgeon: Carole Civil, MD;  Location: [facility];  Service: Orthopedics;  Laterality: Right;  right thigh   . FEMORAL-POPLITEAL BYPASS GRAFT Right 09/15/2012   Procedure: BYPASS GRAFT FEMORAL-POPLITEAL ARTERY;  Surgeon: Mal Misty, MD;  Location: [place];  Service: Vascular;  Laterality: Right;  . FEMORAL-TIBIAL BYPASS GRAFT Right 03/11/2017   Procedure: RIGHT FEMORAL-TIBIAL BYPASS IN-SITU GREATER SAPHENOUS VEIN;  Surgeon: Waynetta Sandy, MD;  Location: [facility];  Service: Vascular;  Laterality: Right;  . HIP PINNING,CANNULATED  11/19/2011   Procedure: CANNULATED HIP PINNING;  Surgeon: Sanjuana Kava, MD;  Location: [facility];  Service: Orthopedics;  Laterality: Right;  . LOWER EXTREMITY ANGIOGRAPHY Bilateral 03/06/2017   Procedure: Lower Extremity Angiography;  Surgeon: Elam Dutch, MD;  Location: [place] CV LAB;  Service: Cardiovascular;  Laterality: Bilateral;  . PATCH ANGIOPLASTY Right  09/15/2012   Procedure: PATCH ANGIOPLASTY;  Surgeon: Mal Misty, MD;  Location: [place] [facility];  Service: Vascular;  Laterality: Right;  . TUBAL LIGATION     Social History:  reports that  has never smoked. she has never used smokeless tobacco. She reports that she does not drink alcohol or use drugs. Patient lives at home  No Known Allergies  Family History  Problem Relation Age of Onset  . Heart failure Mother   . Heart disease Mother   . Stroke Father   . Heart disease Sister   . Diabetes Son   . Hypertension Sister   . Hypertension Sister   . Stroke Sister       Prior to Admission medications   Medication Sig Start Date End Date Taking? Authorizing Provider  acetaminophen (TYLENOL) 325 MG tablet Take 2 tablets (650 mg total) by mouth every 6 (six) hours as needed for mild pain or fever. 03/29/17   Cristal Ford, DO  aspirin EC 81 MG EC tablet Take 1 tablet (81 mg total) by mouth daily. 06/02/17   Johnson, Clanford L, MD  diltiazem (CARDIZEM CD) 240 MG 24 hr capsule Take 240 mg by mouth daily.    [provider]  furosemide (LASIX) 40 MG tablet Take 1 tablet (40 mg total) by mouth every other day. Patient taking differently: Take 40-80 mg every other day by mouth.  06/01/17   Johnson, Clanford L, MD  gabapentin (NEURONTIN) 100 MG capsule Take 100 mg by mouth 3 (three) times daily.      [provider]  hydroxyurea (HYDREA) 500 MG capsule Take 2 capsules (1,000 mg total) by mouth daily. May take with food to minimize GI side effects. 03/03/17   Twana First, MD  ibuprofen (ADVIL,MOTRIN) 200 MG tablet Take 400 mg every 6 (six) hours as needed by mouth for mild pain or moderate pain.    [provider]  levothyroxine (SYNTHROID, LEVOTHROID) 75 MCG tablet Take 1 tablet (75 mcg total) by mouth daily before breakfast. 06/02/17   Murlean Iba, MD  metolazone (ZAROXOLYN) 2.5 MG tablet Take 2.5 mg by mouth every Monday, Wednesday, and Friday.    [provider]  pravastatin (PRAVACHOL) 10 MG tablet Take 1 tablet (10 mg total) by mouth daily. Patient taking differently: Take 10 mg every evening by mouth.  06/01/17   Johnson, Clanford L, MD  vancomycin 500 mg in sodium chloride 0.9 % 100 mL Inject 500 mg into the vein daily.    [provider]  warfarin (COUMADIN) 2.5 MG tablet Take 1 tablet (2.5 mg total) by mouth daily at 6 PM. Take one tablet all days except take 2.5mg  on MWF 06/01/17 06/11/17  Murlean Iba, MD    Physical Exam: BP 110/65   Pulse 88   Temp 97.8 F (36.6 C) (Oral)   Resp 12   Ht 6' (1.829 m)   Wt 73.5 kg (162 lb)   SpO2 92%   BMI 21.97 kg/m   General: Elderly Caucasian female. Awake and alert and oriented x3. No acute cardiopulmonary distress.  HEENT: Normocephalic atraumatic.  Right and left ears normal in appearance.  Pupils equal, round, reactive to light.  Extraocular muscles are intact. Sclerae anicteric and noninjected.  Moist mucosal membranes. No mucosal lesions.  Neck: Neck supple without lymphadenopathy. No carotid bruits. No masses palpated.  Cardiovascular: Regular rate with normal S1-S2 sounds. No murmurs, rubs, gallops auscultated. No JVD.  Respiratory: Good respiratory effort with no wheezes, rales, rhonchi. Lungs clear to auscultation bilaterally.  No accessory muscle use. Abdomen: Soft, nontender, nondistended. Active bowel sounds. No masses or hepatosplenomegaly  Skin: There is a 3 cm black, necrotic area on the lateral aspect of her right foot with black necrotic area of her fourth digit on her right foot.  There is fairly extensive erythema to her leg and foot with warm to touch.  There are 2 ulcerations on the right medial lower leg approximately 3-4 cm by 1.5cm in diameter.  Pulses intact left lower extremity.  Pulses on the right lower extremity are dopplerable at the dorsalis pedis area. Musculoskeletal: No calf or leg pain. All major joints not erythematous nontender.  No upper  or lower joint deformation.  Good ROM.  No contractures  Psychiatric: Intact judgment and insight. Pleasant and cooperative. Neurologic: No focal neurological deficits. Strength is 5/5 and symmetric in upper and lower extremities.  Cranial nerves II through XII are grossly intact.           Labs on Admission: I have personally reviewed following labs and imaging studies  CBC: Recent Labs  Lab 06/22/17 0954 06/27/17 1742  WBC 13.0* 12.6*  NEUTROABS 11.0* 11.0*  HGB 9.6* 9.1*  HCT 33.5* 31.8*  MCV 88.6 90.6  PLT 149* 812*   Basic Metabolic Panel: Recent Labs  Lab 06/22/17 0954 06/27/17 1742  NA 136 142  K 3.4* 3.3*  CL 101 103  CO2 27 30  GLUCOSE 84 89  BUN 41* 33*  CREATININE 1.58* 1.37*  CALCIUM 8.7* 8.6*   GFR: Estimated Creatinine Clearance: 41.6 mL/min (A) (by C-G formula based on SCr of 1.37 mg/dL (H)). Liver Function Tests: Recent Labs  Lab 06/22/17 0954  AST 19  ALT 13*  ALKPHOS 99  BILITOT 0.9  PROT 5.8*  ALBUMIN 2.5*   No results for input(s): LIPASE, AMYLASE in the last 168 hours. No results for input(s): AMMONIA in the last 168 hours. Coagulation Profile: Recent Labs  Lab 06/22/17 06/27/17 1742  INR 2.0 2.64   Cardiac Enzymes: No results for input(s): CKTOTAL, CKMB, CKMBINDEX, TROPONINI in the last 168 hours. BNP (last 3 results) No results for input(s): PROBNP in the last 8760 hours. HbA1C: No results for input(s): HGBA1C in the last 72 hours. CBG: No results for input(s): GLUCAP in the last 168 hours. Lipid Profile: No results for input(s): CHOL, HDL, LDLCALC, TRIG, CHOLHDL, LDLDIRECT in the last 72 hours. Thyroid Function Tests: No results for input(s): TSH, T4TOTAL, FREET4, T3FREE, THYROIDAB in the last 72 hours. Anemia Panel: No results for input(s): VITAMINB12, FOLATE, FERRITIN, TIBC, IRON, RETICCTPCT in the last 72 hours. Urine analysis:    Component Value Date/Time   COLORURINE YELLOW 03/09/2017 1648   APPEARANCEUR CLEAR  03/09/2017 1648   LABSPEC 1.021 03/09/2017 1648   PHURINE 5.0 03/09/2017 1648   GLUCOSEU NEGATIVE 03/09/2017 1648   HGBUR MODERATE (A) 03/09/2017 1648   BILIRUBINUR NEGATIVE 03/09/2017 1648   KETONESUR NEGATIVE 03/09/2017 1648   PROTEINUR NEGATIVE 03/09/2017 1648   UROBILINOGEN 1.0 09/14/2012 2337   NITRITE NEGATIVE 03/09/2017 1648   LEUKOCYTESUR NEGATIVE 03/09/2017 1648   Sepsis Labs: @LABRCNTIP (procalcitonin:4,lacticidven:4) )No results found for this or any previous visit (from the  past 240 hour(s)).   Radiological Exams on Admission: Dg Tibia/fibula Right  Result Date: 06/27/2017 CLINICAL DATA:  Pain and soft tissue infection. EXAM: RIGHT TIBIA AND FIBULA - 2 VIEW COMPARISON:  None. FINDINGS: Skin/soft tissue defects of the upper medial aspect of the lower leg. No tracking soft tissue air. Surgical clips but no radiopaque foreign body. Diffuse soft tissue edema of the lower extremity. No acute osseous abnormality or bony destructive change. IMPRESSION: Skin/soft tissue defects of the upper medial leg may reflect ulcers. No tracking soft tissue air, radiopaque foreign body, or underlying osseous abnormality. Electronically Signed   By: Jeb Levering M.D.   On: 06/27/2017 19:15   Dg Foot Complete Right  Result Date: 06/27/2017 CLINICAL DATA:  Pain, soft tissue infection.  Necrotic toes. EXAM: RIGHT FOOT COMPLETE - 3+ VIEW COMPARISON:  None. FINDINGS: Atrophic soft tissues of the distal fourth digit with question of osseous demineralization of the distal phalanx. No tracking soft tissue air. Fifth digit distal phalanx is truncated in a chronic appearance. Soft tissue irregularity noted about the lateral foot adjacent to the distal fifth metatarsal. Degenerative change of the first ray. No fracture or dislocation. Vascular calcifications. Dorsal soft tissue edema. IMPRESSION: Atrophic soft tissues of the distal fourth digit with question of distal phalanx demineralization, possible  osteomyelitis. Skin irregularity of the lateral foot about the distal fifth metatarsal without underlying bony changes. Electronically Signed   By: Jeb Levering M.D.   On: 06/27/2017 19:14    EKG: Independently reviewed.  Atrial fibrillation.  No acute ST changes.  Assessment/Plan: Principal Problem:   Cellulitis and abscess of right leg Active Problems:   Chronic atrial fibrillation (HCC)   Anticoagulant long-term use   HTN (hypertension)   Hypothyroidism   Peripheral vascular disease (HCC)   Hard of hearing   CHF (congestive heart failure) (HCC)   Chronic respiratory failure with hypoxia (HCC)   Lower extremity arterial insufficiency, severe, right (HCC)   GERD (gastroesophageal reflux disease)   S/P femoral-popliteal bypass surgery   Dry gangrene (HCC)   Hypokalemia   Osteomyelitis of right foot ([place])    This patient was discussed with the ED physician, including pertinent vitals, physical exam findings, labs, and imaging.  We also discussed care given by the ED provider.  1. cellulitis and abscess of right leg  Admit to [facility]  Vancomycin and Zosyn   blood cultures obtained  MRSA screen  Wound care consult 2.  Dry gangrene  Vascular consult  May need orthopedic consult if vascular determined nothing else to do 3.  Peripheral vascular disease  Continue Coumadin 4.  Status post femoropopliteal bypass surgery 5.  Osteomyelitis right foot  Diagnosed by x-ray  We will get MRI 6.  Chronic anticoagulation use  INR appropriate 7.  Chronic atrial fibrillation  Rate controlled  Continue diltiazem  Continue Coumadin for now 8.  Hypertension  Stable 9.  Hypothyroidism  Continue levothyroxine 10.  CHF  Compensated  Continue diuretics 11.  Chronic respiratory failure  Continue oxygen therapy 12.  GERD  Continue antacid therapy 13. Hypokalemia  Potassium replaced in the ED  DVT prophylaxis: Therapeutic on Coumadin Consultants:  Vascular consult Code Status: DNR Family Communication: None Disposition Plan: Pending   Truett Mainland, DO [facility] Pager [phone]  If 7PM-7AM, please contact night-coverage [email or web address] Password [email or web address]

## 2017-06-28 ENCOUNTER — Inpatient Hospital Stay (HOSPITAL_COMMUNITY): Payer: Medicare Other

## 2017-06-28 ENCOUNTER — Other Ambulatory Visit: Payer: Self-pay

## 2017-06-28 DIAGNOSIS — I96 Gangrene, not elsewhere classified: Secondary | ICD-10-CM

## 2017-06-28 LAB — CBC
HEMATOCRIT: 29.1 % — AB (ref 36.0–46.0)
HEMOGLOBIN: 8.5 g/dL — AB (ref 12.0–15.0)
MCH: 25.6 pg — AB (ref 26.0–34.0)
MCHC: 29.2 g/dL — AB (ref 30.0–36.0)
MCV: 87.7 fL (ref 78.0–100.0)
Platelets: 440 10*3/uL — ABNORMAL HIGH (ref 150–400)
RBC: 3.32 MIL/uL — ABNORMAL LOW (ref 3.87–5.11)
RDW: 25.6 % — ABNORMAL HIGH (ref 11.5–15.5)
WBC: 11.6 10*3/uL — ABNORMAL HIGH (ref 4.0–10.5)

## 2017-06-28 LAB — BASIC METABOLIC PANEL
ANION GAP: 9 (ref 5–15)
BUN: 27 mg/dL — ABNORMAL HIGH (ref 6–20)
CHLORIDE: 102 mmol/L (ref 101–111)
CO2: 28 mmol/L (ref 22–32)
Calcium: 8.3 mg/dL — ABNORMAL LOW (ref 8.9–10.3)
Creatinine, Ser: 1.3 mg/dL — ABNORMAL HIGH (ref 0.44–1.00)
GFR calc Af Amer: 46 mL/min — ABNORMAL LOW (ref >60)
GFR calc non Af Amer: 39 mL/min — ABNORMAL LOW (ref >60)
GLUCOSE: 78 mg/dL (ref 65–99)
POTASSIUM: 3.4 mmol/L — AB (ref 3.5–5.1)
Sodium: 139 mmol/L (ref 135–145)

## 2017-06-28 LAB — MRSA PCR SCREENING: MRSA by PCR: NEGATIVE

## 2017-06-28 LAB — PROTIME-INR
INR: 2.56
PROTHROMBIN TIME: 27.3 s — AB (ref 11.4–15.2)

## 2017-06-28 MED ORDER — WARFARIN SODIUM 5 MG PO TABS
2.5000 mg | ORAL_TABLET | Freq: Once | ORAL | Status: AC
  Administered 2017-06-28: 2.5 mg via ORAL
  Filled 2017-06-28: qty 1

## 2017-06-28 NOTE — Consult Note (Signed)
[facility] Nurse wound consult note Reason for Consult: Chronic nonhealing surgical wound to right lower leg below knee.  03/2017 right femoral to tibial peroneal trunk bypass with vein.  Two open segments along incision line Gangrenous right fourth metatarsal, dry and intact.  Dry stable eschar to right lateral foot.  Unstageable pressure injury to right heel, present on admission.   Wound type: Pressure Injury POA: Yes Measurement: Right lower leg:  Proximal:  1 cm x 0.3 cm x 0.3 cm  Distal:  0.5 cm diameter x 0.3 cm  Eschar to right fourth toe, right lateral foot 100% slough to wound right heel:  1 cm x 0.4 cm  Wound YPE:JYLT pink nongranulating Drainage (amount, consistency, odor) minimal serosanguinous to right lower leg and heel Eschar is dry and stable to right lateral foot, toe Periwound: Intact Right foot has positive pedal pulse with doppler Dressing procedure/placement/frequency:Cleanse wound below right knee with NS.  Gently fill wound depth with packing strip.  Cover with dry dressing. Change daily.  Leave eschar to right lateral foot and toe open to air.  Silicone border foam dressing to right heel to promote autolysis and protect.  Will not follow at this time.  Please re-consult if needed.  Domenic Moras RN BSN [place] Pager [phone]

## 2017-06-28 NOTE — Consult Note (Signed)
Patient name: Charlotte [name] MRN: [id number] DOB: 1941/07/25 Sex: female  REASON FOR CONSULT:   Follow-up after right femoral tibial bypass  HPI:   Charlotte [name] is a pleasant 76 y.o. female who underwent a right femoral to tibial peroneal trunk bypass with an in situ vein graft by Dr. [name] on 03/11/17.  She was apparently pending emergency department because of worsening pain in her right foot which has been going on for 2 weeks.  She was transferred to [facility] for vascular evaluation.  She is hard of hearing and is difficult to obtain a history from but it sounds like her pain in her foot has improved.  She is unaware of any fever.  She does describe some claudication.  I think her activity is fairly limited.  She describes mild rest pain in the foot.  Current Facility-Administered Medications  Medication Dose Route Frequency Provider Last Rate Last Dose  . acetaminophen (TYLENOL) tablet 650 mg  650 mg Oral Q6H PRN Truett Mainland, DO   650 mg at 06/27/17 2257   Or  . acetaminophen (TYLENOL) suppository 650 mg  650 mg Rectal Q6H PRN Truett Mainland, DO      . aspirin EC tablet 81 mg  81 mg Oral Daily Truett Mainland, DO      . diltiazem (CARDIZEM CD) 24 hr capsule 240 mg  240 mg Oral Daily Truett Mainland, DO      . furosemide (LASIX) tablet 40 mg  40 mg Oral QODAY Stinson, Jacob J, DO      . gabapentin (NEURONTIN) capsule 100 mg  100 mg Oral TID Truett Mainland, DO   100 mg at 06/27/17 2258  . hydroxyurea (HYDREA) capsule 1,000 mg  1,000 mg Oral Daily Truett Mainland, DO      . levothyroxine (SYNTHROID, LEVOTHROID) tablet 75 mcg  75 mcg Oral QAC breakfast Truett Mainland, DO      . [START ON 06/29/2017] metolazone (ZAROXOLYN) tablet 2.5 mg  2.5 mg Oral Q M,W,F Truett Mainland, DO      . ondansetron [facility]) tablet 4 mg  4 mg Oral Q6H PRN Truett Mainland, DO       Or  . ondansetron [facility]) injection 4 mg  4 mg Intravenous Q6H PRN Truett Mainland, DO   4 mg at 06/27/17  2056  . oxyCODONE (Oxy IR/ROXICODONE) immediate release tablet 5 mg  5 mg Oral Q4H PRN Truett Mainland, DO      . piperacillin-tazobactam (ZOSYN) IVPB 3.375 g  3.375 g Intravenous Q8H Truett Mainland, DO 12.5 mL/hr at 06/28/17 0459 3.375 g at 06/28/17 0459  . pravastatin (PRAVACHOL) tablet 10 mg  10 mg Oral QPM Truett Mainland, DO   10 mg at 06/27/17 2258  . vancomycin (VANCOCIN) IVPB 750 mg/150 ml premix  750 mg Intravenous Q24H Truett Mainland, DO      . Warfarin - Pharmacist Dosing Inpatient   Does not apply q1800 Truett Mainland, DO        REVIEW OF SYSTEMS:  [X]  denotes positive finding, [ ]  denotes negative finding Cardiac  Comments:  Chest pain or chest pressure:    Shortness of breath upon exertion:    Short of breath when lying flat:    Irregular heart rhythm:    Constitutional    Fever or chills:     PHYSICAL EXAM:   Vitals:   06/27/17 2100 06/27/17 2130 06/27/17 2236 06/28/17  0507  BP: (!) 110/58 115/76 114/66 116/73  Pulse: 83 91 78 84  Resp: 18 17 16 16   Temp:   97.6 F (36.4 C) (!) 97.5 F (36.4 C)  TempSrc:   Oral Oral  SpO2: 95% 92% 95% 96%  Weight:   159 lb (72.1 kg)   Height:   6' (1.829 m)     GENERAL: The patient is a well-nourished female, in no acute distress. The vital signs are documented above. CARDIOVASCULAR: There is a regular rate and rhythm. PULMONARY: There is good air exchange bilaterally without wheezing or rales. VASCULAR: I do not detect carotid bruits. She has an easily palpable graft pulse in her right medial thigh. She has a brisk dorsalis pedis signal on the right with the Doppler. She has a brisk dorsalis pedis and posterior tibial signal on the left. SKIN: She has dry gangrene of her fourth toe.  Her below the knee incision has some areas of separation which are healed.  DATA:   No new data.  MEDICAL ISSUES:   STATUS POST RIGHT FEMORAL TO TIBIAL PERONEAL TRUNK BYPASS WITH VEIN: This patient had a bypass in August.  The  bypass graft is functioning well.  She has dry gangrene of her right fourth toe.  I do not think the pain in her foot is from ischemia as she has an excellent Doppler signal in the dorsalis pedis position.  I will notify Dr. [name] of her admission.  She is currently on intravenous Zosyn and vancomycin.   [name] [facility] [phone]

## 2017-06-28 NOTE — Progress Notes (Signed)
ANTICOAGULATION CONSULT NOTE - Follow Up Consult  Pharmacy Consult for Warfarin Indication: atrial fibrillation  No Known Allergies  Patient Measurements: Height: 6' (182.9 cm) Weight: 159 lb (72.1 kg) IBW/kg (Calculated) : 73.1  Vital Signs: Temp: 97.5 F (36.4 C) (11/25 0507) Temp Source: Oral (11/25 0507) BP: 116/73 (11/25 0507) Pulse Rate: 84 (11/25 0507)  Labs: Recent Labs    06/27/17 1742 06/28/17 0715  HGB 9.1* 8.5*  HCT 31.8* 29.1*  PLT 497* 440*  LABPROT 28.0* 27.3*  INR 2.64 2.56  CREATININE 1.37* 1.30*    Estimated Creatinine Clearance: 43.2 mL/min (A) (by C-G formula based on SCr of 1.3 mg/dL (H)).   Assessment: 76 year old female on warfarin prior to admission for Afib INR therapeutic on admission. Dose PTA = 5 mg on Mondays, 2.5 mg other days Last dose 11/23 pta.  Today, INR remains therapeutic.  Goal of Therapy:  INR 2-3 Monitor platelets by anticoagulation protocol: Yes   Plan:  Warfarin 2.5 mg po x 1 tonight Daily INR  Sloan Leiter, PharmD, BCPS, BCCCP Clinical Pharmacist Clinical phone 06/28/2017 until 3:30PM - #[phone] After hours, please call #[phone] 06/28/2017,10:22 AM

## 2017-06-28 NOTE — Progress Notes (Signed)
PROGRESS NOTE    Charlotte [name]  JAS:[id number] DOB: 06-Jan-1941 DOA: 06/27/2017 PCP: [name], MD    Brief Narrative: Charlotte [name] is a 76 y.o. 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  Patient came to ED for evaluation for foot pain that has been worsening over the past month, but dramatically worse over the past 2 weeks.  She has been taking a lot of ibuprofen secondary to pain.  Pain worse with ambulation, although not worse with pressure.  Ibuprofen not currently helping due to pain.  Her right leg is also sore, but no real pain.  Denies fevers, chills, nausea, vomiting.   Emergency Department Course: .  X-ray of right leg without evidence of osteomyelitis.  There is evidence of osteomyelitis in the right toes   On x-ray.  White count normal.    Assessment & Plan:   Principal Problem:   Cellulitis and abscess of right leg Active Problems:   Chronic atrial fibrillation (HCC)   Anticoagulant long-term use   HTN (hypertension)   Hypothyroidism   Peripheral vascular disease (HCC)   Hard of hearing   CHF (congestive heart failure) (HCC)   Chronic respiratory failure with hypoxia (HCC)   Lower extremity arterial insufficiency, severe, right (HCC)   GERD (gastroesophageal reflux disease)   S/P femoral-popliteal bypass surgery   Dry gangrene (HCC)   Hypokalemia   Osteomyelitis of right foot ([place])   1-Cellulitis right leg; Osteomyelitis right foot ?;  -MRI; No evidence of osteomyelitis is seen but 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 Negative for abscess or septic joint. Skin wound adjacent to the head of the fifth metatarsal. Marrow edema in the the periphery of the head of fifth metatarsal could be due to osteomyelitis. Assessment for osteomyelitis is  markedly limited as the patient has medullary infarcts in all imaged bones. -On IV vancomycin and Zosyn.  -will follow Dr Charlott Rakes recommendations.   2-Dry gangrene; PVD, S/P femoral popliteal bypass;  Vascular following.   3-Chronic A fib;  continue with coumadin.  Continue with Cardizem.   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   5-HTN; continue with Cardizem, lasix metolazone.   Hypothyroidism; continue with synthroid;  Chronic diastolic HF; Continue with metolazone, lasix every other day. Compensated.   Chronic Respiratory Failure;  On chronic 4 L of oxygen.   GERD;           DVT prophylaxis: Coumadin Code Status: DNR Family Communication: Care discussed with patient.  Disposition Plan: To be determine.   Consultants:   Vascular.    Procedures:      Antimicrobials:   Zosyn 06-27-2017  Vancomycin 06-27-2017   Subjective: Patient report right foot pain. Pain has been getting worse over last few days.  Swelling of her leg on and off.    Objective: Vitals:   06/27/17 2100 06/27/17 2130 06/27/17 2236 06/28/17 0507  BP: (!) 110/58 115/76 114/66 116/73  Pulse: 83 91 78 84  Resp: 18 17 16 16   Temp:   97.6 F (36.4 C) (!) 97.5 F (36.4 C)  TempSrc:   Oral Oral  SpO2: 95% 92% 95% 96%  Weight:   72.1 kg (159 lb)   Height:   6' (1.829 m)     Intake/Output Summary (Last 24 hours) at 06/28/2017 [phone]  Last data filed at 06/27/2017 2230 Gross per 24 hour  Intake -  Output 300 ml  Net -300 ml   Filed Weights   06/27/17 1712 06/27/17 2236  Weight: 73.5 kg (162 lb) 72.1 kg (159 lb)    Examination:  General exam: Appears calm and comfortable  Respiratory system: Clear to auscultation. Respiratory effort normal. Cardiovascular system: S1 & S2 heard, RRR. No JVD, murmurs, rubs, gallops or clicks. No pedal edema. Gastrointestinal system: Abdomen is nondistended,  soft and nontender. No organomegaly or masses felt. Normal bowel sounds heard. Central nervous system: Alert and oriented. No focal neurological deficits. Extremities: Symmetric 5 x 5 power. Skin: right foot with mild redness, gangrenosus 4 metatarsal, 2 open wound  along incision line, right heel pressure injury.   Data Reviewed: I have personally reviewed following labs and imaging studies  CBC: Recent Labs  Lab 06/22/17 0954 06/27/17 1742  WBC 13.0* 12.6*  NEUTROABS 11.0* 11.0*  HGB 9.6* 9.1*  HCT 33.5* 31.8*  MCV 88.6 90.6  PLT 149* 545*   Basic Metabolic Panel: Recent Labs  Lab 06/22/17 0954 06/27/17 1742  NA 136 142  K 3.4* 3.3*  CL 101 103  CO2 27 30  GLUCOSE 84 89  BUN 41* 33*  CREATININE 1.58* 1.37*  CALCIUM 8.7* 8.6*   GFR: Estimated Creatinine Clearance: 41 mL/min (A) (by C-G formula based on SCr of 1.37 mg/dL (H)). Liver Function Tests: Recent Labs  Lab 06/22/17 0954  AST 19  ALT 13*  ALKPHOS 99  BILITOT 0.9  PROT 5.8*  ALBUMIN 2.5*   No results for input(s): LIPASE, AMYLASE in the last 168 hours. No results for input(s): AMMONIA in the last 168 hours. Coagulation Profile: Recent Labs  Lab 06/22/17 06/27/17 1742  INR 2.0 2.64   Cardiac Enzymes: No results for input(s): CKTOTAL, CKMB, CKMBINDEX, TROPONINI in the last 168 hours. BNP (last 3 results) No results for input(s): PROBNP in the last 8760 hours. HbA1C: No results for input(s): HGBA1C in the last 72 hours. CBG: No results for input(s): GLUCAP in the last 168 hours. Lipid Profile: No results for input(s): CHOL, HDL, LDLCALC, TRIG, CHOLHDL, LDLDIRECT in the last 72 hours. Thyroid Function Tests: No results for input(s): TSH, T4TOTAL, FREET4, T3FREE, THYROIDAB in the last 72 hours. Anemia Panel: No results for input(s): VITAMINB12, FOLATE, FERRITIN, TIBC, IRON, RETICCTPCT in the last 72 hours. Sepsis Labs: Recent Labs  Lab 06/27/17 2103  LATICACIDVEN 1.44    Recent Results  (from the past 240 hour(s))  Culture, blood (Routine X 2) w Reflex to ID Panel     Status: None (Preliminary result)   Collection Time: 06/27/17  8:23 PM  Result Value Ref Range Status   Specimen Description LEFT ANTECUBITAL  Final   Special Requests   Final    BOTTLES DRAWN AEROBIC AND ANAEROBIC Blood Culture adequate volume   Culture PENDING  Incomplete   Report Status PENDING  Incomplete  Culture, blood (Routine X 2) w Reflex to ID Panel     Status: None (Preliminary result)   Collection Time: 06/27/17  8:28 PM  Result Value Ref Range Status   Specimen Description BLOOD RIGHT ARM  Final   Special Requests   Final    BOTTLES DRAWN AEROBIC ONLY Blood Culture adequate volume   Culture PENDING  Incomplete   Report Status PENDING  Incomplete         Radiology Studies: Dg Tibia/fibula Right  Result Date: 06/27/2017 CLINICAL DATA:  Pain and  soft tissue infection. EXAM: RIGHT TIBIA AND FIBULA - 2 VIEW COMPARISON:  None. FINDINGS: Skin/soft tissue defects of the upper medial aspect of the lower leg. No tracking soft tissue air. Surgical clips but no radiopaque foreign body. Diffuse soft tissue edema of the lower extremity. No acute osseous abnormality or bony destructive change. IMPRESSION: Skin/soft tissue defects of the upper medial leg may reflect ulcers. No tracking soft tissue air, radiopaque foreign body, or underlying osseous abnormality. Electronically Signed   By: Jeb Levering M.D.   On: 06/27/2017 19:15   Dg Foot Complete Right  Result Date: 06/27/2017 CLINICAL DATA:  Pain, soft tissue infection.  Necrotic toes. EXAM: RIGHT FOOT COMPLETE - 3+ VIEW COMPARISON:  None. FINDINGS: Atrophic soft tissues of the distal fourth digit with question of osseous demineralization of the distal phalanx. No tracking soft tissue air. Fifth digit distal phalanx is truncated in a chronic appearance. Soft tissue irregularity noted about the lateral foot adjacent to the distal fifth metatarsal.  Degenerative change of the first ray. No fracture or dislocation. Vascular calcifications. Dorsal soft tissue edema. IMPRESSION: Atrophic soft tissues of the distal fourth digit with question of distal phalanx demineralization, possible osteomyelitis. Skin irregularity of the lateral foot about the distal fifth metatarsal without underlying bony changes. Electronically Signed   By: Jeb Levering M.D.   On: 06/27/2017 19:14        Scheduled Meds: . aspirin EC  81 mg Oral Daily  . diltiazem  240 mg Oral Daily  . furosemide  40 mg Oral QODAY  . gabapentin  100 mg Oral TID  . hydroxyurea  1,000 mg Oral Daily  . levothyroxine  75 mcg Oral QAC breakfast  . [START ON 06/29/2017] metolazone  2.5 mg Oral Q M,W,F  . pravastatin  10 mg Oral QPM  . Warfarin - Pharmacist Dosing Inpatient   Does not apply q1800   Continuous Infusions: . piperacillin-tazobactam (ZOSYN)  IV 3.375 g (06/28/17 0459)  . vancomycin       LOS: 1 day    Time spent: 35 minutes.     Elmarie Shiley, MD [facility] Pager [phone]  If 7PM-7AM, please contact night-coverage [email or web address] Password [facility] 06/28/2017, 7:29 AM

## 2017-06-29 DIAGNOSIS — R7881 Bacteremia: Secondary | ICD-10-CM

## 2017-06-29 DIAGNOSIS — M79671 Pain in right foot: Secondary | ICD-10-CM

## 2017-06-29 DIAGNOSIS — L0390 Cellulitis, unspecified: Secondary | ICD-10-CM

## 2017-06-29 DIAGNOSIS — N183 Chronic kidney disease, stage 3 (moderate): Secondary | ICD-10-CM

## 2017-06-29 HISTORY — DX: Cellulitis, unspecified: L03.90

## 2017-06-29 LAB — PROTIME-INR
INR: 2.74
PROTHROMBIN TIME: 28.8 s — AB (ref 11.4–15.2)

## 2017-06-29 LAB — BLOOD CULTURE ID PANEL (REFLEXED)
ACINETOBACTER BAUMANNII: NOT DETECTED
Candida albicans: NOT DETECTED
Candida glabrata: NOT DETECTED
Candida krusei: NOT DETECTED
Candida parapsilosis: NOT DETECTED
Candida tropicalis: NOT DETECTED
ENTEROCOCCUS SPECIES: NOT DETECTED
Enterobacter cloacae complex: NOT DETECTED
Enterobacteriaceae species: NOT DETECTED
Escherichia coli: NOT DETECTED
HAEMOPHILUS INFLUENZAE: NOT DETECTED
Klebsiella oxytoca: NOT DETECTED
Klebsiella pneumoniae: NOT DETECTED
LISTERIA MONOCYTOGENES: NOT DETECTED
METHICILLIN RESISTANCE: DETECTED — AB
NEISSERIA MENINGITIDIS: NOT DETECTED
PROTEUS SPECIES: NOT DETECTED
PSEUDOMONAS AERUGINOSA: NOT DETECTED
SERRATIA MARCESCENS: NOT DETECTED
STAPHYLOCOCCUS AUREUS BCID: NOT DETECTED
STAPHYLOCOCCUS SPECIES: DETECTED — AB
STREPTOCOCCUS AGALACTIAE: NOT DETECTED
STREPTOCOCCUS PNEUMONIAE: NOT DETECTED
STREPTOCOCCUS SPECIES: NOT DETECTED
Streptococcus pyogenes: NOT DETECTED

## 2017-06-29 LAB — BASIC METABOLIC PANEL
Anion gap: 7 (ref 5–15)
BUN: 24 mg/dL — AB (ref 6–20)
CHLORIDE: 103 mmol/L (ref 101–111)
CO2: 30 mmol/L (ref 22–32)
CREATININE: 1.49 mg/dL — AB (ref 0.44–1.00)
Calcium: 8.3 mg/dL — ABNORMAL LOW (ref 8.9–10.3)
GFR, EST AFRICAN AMERICAN: 39 mL/min — AB (ref >60)
GFR, EST NON AFRICAN AMERICAN: 33 mL/min — AB (ref >60)
Glucose, Bld: 79 mg/dL (ref 65–99)
Potassium: 3.4 mmol/L — ABNORMAL LOW (ref 3.5–5.1)
SODIUM: 140 mmol/L (ref 135–145)

## 2017-06-29 LAB — CBC
HCT: 29.5 % — ABNORMAL LOW (ref 36.0–46.0)
HEMOGLOBIN: 8.4 g/dL — AB (ref 12.0–15.0)
MCH: 25.4 pg — AB (ref 26.0–34.0)
MCHC: 28.5 g/dL — AB (ref 30.0–36.0)
MCV: 89.1 fL (ref 78.0–100.0)
PLATELETS: 496 10*3/uL — AB (ref 150–400)
RBC: 3.31 MIL/uL — AB (ref 3.87–5.11)
RDW: 26 % — ABNORMAL HIGH (ref 11.5–15.5)
WBC: 10.4 10*3/uL (ref 4.0–10.5)

## 2017-06-29 MED ORDER — WARFARIN SODIUM 5 MG PO TABS
2.5000 mg | ORAL_TABLET | Freq: Once | ORAL | Status: AC
  Administered 2017-06-29: 2.5 mg via ORAL
  Filled 2017-06-29: qty 1

## 2017-06-29 NOTE — Progress Notes (Addendum)
[facility] [place] [facility]  Subjective  - Doing OK over all.   Objective 115/60 84 (!) 97.5 F (36.4 C) (Oral) 16 93%  Intake/Output Summary (Last 24 hours) at 06/29/2017 0826 Last data filed at 06/29/2017 0700 Gross per 24 hour  Intake 370 ml  Output 1000 ml  Net -630 ml    No active motion right ankle or toes Dry gangrene third toe tip and 5th metatarsal heal lateral foot edema and erythema Foot warm to touch Dry dressing over medial BP incision, healing with separation of original incision   Assessment/Planning: Right femoral to tibial peroneal trunk bypass with an in situ vein graft by Dr. [name] on 03/11/17.   Dry gangrene stable CR is elevating 1.3 to 1.49 now Patient on IV Vanc and Zosyn Coumadin INR 2.74 therapuetic   Roxy Horseman 06/29/2017 8:26 AM --  Laboratory Lab Results: Recent Labs    06/28/17 0715 06/29/17 0638  WBC 11.6* 10.4  HGB 8.5* 8.4*  HCT 29.1* 29.5*  PLT 440* 496*   BMET Recent Labs    06/28/17 0715 06/29/17 0638  NA 139 140  K 3.4* 3.4*  CL 102 103  CO2 28 30  GLUCOSE 78 79  BUN 27* 24*  CREATININE 1.30* 1.49*  CALCIUM 8.3* 8.3*    COAG Lab Results  Component Value Date   INR 2.74 06/29/2017   INR 2.56 06/28/2017   INR 2.64 06/27/2017   No results found for: PTT   I have independently interviewed and examined patient and agree with PA assessment and plan above. Foot is grossly unchanged and bypass is palpable subcutaneously. Ok to discharge and f/u as scheduled with non-invasive vascular studies.   Rukia Mcgillivray C. [name], MD [facility] [place] Office: [phone] Pager: [phone]

## 2017-06-29 NOTE — Consult Note (Signed)
   [facility] CM Inpatient Consult   06/29/2017  Charlotte [name] May 07, 1941 [id number]  Patient assessed for re-admissions with cellulitis of the right leg and dry gangrene in the Medicare ACO. Patient with a HX of HF, chronic A Fib, and chronic respiratory failure on home 02.  Chart reviewed and patient's disposition and wound management to be determined.  Spoke with inpatient RNCM, Nira Conn, that following for re-admission assessment. Await PT/OT assessment for disposition needs for transition.  For questions, please contact:  Natividad Brood, RN BSN [facility] Liaison  [phone] business mobile phone Toll free office [phone]

## 2017-06-29 NOTE — Progress Notes (Signed)
ANTICOAGULATION CONSULT NOTE - Follow Up Consult  Pharmacy Consult for Warfarin Indication: atrial fibrillation  No Known Allergies  Patient Measurements: Height: 6' (182.9 cm) Weight: 159 lb (72.1 kg) IBW/kg (Calculated) : 73.1  Vital Signs: Temp: 97.5 F (36.4 C) (11/26 0422) Temp Source: Oral (11/26 0422) BP: 115/60 (11/26 0756) Pulse Rate: 84 (11/26 0756)  Labs: Recent Labs    06/27/17 1742 06/28/17 0715 06/29/17 0638  HGB 9.1* 8.5* 8.4*  HCT 31.8* 29.1* 29.5*  PLT 497* 440* 496*  LABPROT 28.0* 27.3* 28.8*  INR 2.64 2.56 2.74  CREATININE 1.37* 1.30* 1.49*    Estimated Creatinine Clearance: 37.7 mL/min (A) (by C-G formula based on SCr of 1.49 mg/dL (H)).   Assessment: 76 year old female on warfarin prior to admission for Afib INR therapeutic on admission. Dose PTA = 5 mg on Mondays, 2.5 mg other days Last dose 11/23 pta.  Today, INR remains therapeutic. CBC stable. No bleed documented.  Goal of Therapy:  INR 2-3 Monitor platelets by anticoagulation protocol: Yes   Plan:  Warfarin 2.5 mg po x 1 again tonight Daily INR Monitor CBC, s/sx bleeding  Elicia Lamp, PharmD, BCPS Clinical Pharmacist Rx Phone # for today: [phone] After 3:30PM, please call Main Rx: [phone] 06/29/2017 12:09 PM

## 2017-06-29 NOTE — Progress Notes (Signed)
Received critical lab result- positive blood culture + gram cocci in aerobic bottle. Notified Xenia Blount,NP via text page.

## 2017-06-29 NOTE — Telephone Encounter (Signed)
Added pts 2ndry ins to coverage summary and email navigant to file to [employer]

## 2017-06-29 NOTE — Progress Notes (Signed)
PHARMACY - PHYSICIAN COMMUNICATION CRITICAL VALUE ALERT - BLOOD CULTURE IDENTIFICATION (BCID)  Results for orders placed or performed during the hospital encounter of 06/27/17  Blood Culture ID Panel (Reflexed) (Collected: 06/27/2017  8:28 PM)  Result Value Ref Range   Enterococcus species NOT DETECTED NOT DETECTED   Listeria monocytogenes NOT DETECTED NOT DETECTED   Staphylococcus species DETECTED (A) NOT DETECTED   Staphylococcus aureus NOT DETECTED NOT DETECTED   Methicillin resistance DETECTED (A) NOT DETECTED   Streptococcus species NOT DETECTED NOT DETECTED   Streptococcus agalactiae NOT DETECTED NOT DETECTED   Streptococcus pneumoniae NOT DETECTED NOT DETECTED   Streptococcus pyogenes NOT DETECTED NOT DETECTED   Acinetobacter baumannii NOT DETECTED NOT DETECTED   Enterobacteriaceae species NOT DETECTED NOT DETECTED   Enterobacter cloacae complex NOT DETECTED NOT DETECTED   Escherichia coli NOT DETECTED NOT DETECTED   Klebsiella oxytoca NOT DETECTED NOT DETECTED   Klebsiella pneumoniae NOT DETECTED NOT DETECTED   Proteus species NOT DETECTED NOT DETECTED   Serratia marcescens NOT DETECTED NOT DETECTED   Haemophilus influenzae NOT DETECTED NOT DETECTED   Neisseria meningitidis NOT DETECTED NOT DETECTED   Pseudomonas aeruginosa NOT DETECTED NOT DETECTED   Candida albicans NOT DETECTED NOT DETECTED   Candida glabrata NOT DETECTED NOT DETECTED   Candida krusei NOT DETECTED NOT DETECTED   Candida parapsilosis NOT DETECTED NOT DETECTED   Candida tropicalis NOT DETECTED NOT DETECTED    Name of physician (or Provider) Contacted: Dr Tyrell Antonio  Changes to prescribed antibiotics required: None likely contaminant but already on vanc and zosyn  Charlotte [name] 06/29/2017  9:29 AM

## 2017-06-29 NOTE — Progress Notes (Signed)
PROGRESS NOTE    Charlotte [name]  JKD:[id number] DOB: 05/12/1941 DOA: 06/27/2017 PCP: [name], MD    Brief Narrative: Charlotte [name] is a 76 y.o. 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  Patient came to ED for evaluation for foot pain that has been worsening over the past month, but dramatically worse over the past 2 weeks.  She has been taking a lot of ibuprofen secondary to pain.  Pain worse with ambulation, although not worse with pressure.  Ibuprofen not currently helping due to pain.  Her right leg is also sore, but no real pain.  Denies fevers, chills, nausea, vomiting.   Emergency Department Course: .  X-ray of right leg without evidence of osteomyelitis.  There is evidence of osteomyelitis in the right toes   On x-ray.  White count normal.    Assessment & Plan:   Principal Problem:   Cellulitis and abscess of right leg Active Problems:   Chronic atrial fibrillation (HCC)   Anticoagulant long-term use   HTN (hypertension)   Hypothyroidism   Peripheral vascular disease (HCC)   Hard of hearing   CHF (congestive heart failure) (HCC)   Chronic respiratory failure with hypoxia (HCC)   Lower extremity arterial insufficiency, severe, right (HCC)   GERD (gastroesophageal reflux disease)   S/P femoral-popliteal bypass surgery   Dry gangrene (HCC)   Hypokalemia   Osteomyelitis of right foot ([place])   1-Cellulitis right leg; Osteomyelitis right foot ?;  -MRI; No evidence of osteomyelitis is seen but 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 Negative for abscess or septic joint. Skin wound adjacent to the head of the fifth metatarsal. Marrow edema in the the periphery of the head of fifth metatarsal could be due to osteomyelitis. Assessment for osteomyelitis is  markedly limited as the patient has medullary infarcts in all imaged bones. -On IV vancomycin and Zosyn.  -Will follow Dr [name] recommendations, regarding MRI findings.  -one of two blood culture positive for staph, not aureus.  -ID consulted.   2-Dry gangrene; PVD, S/P femoral popliteal bypass;  Vascular following.   3-Chronic A fib;  continue with coumadin.  Continue with Cardizem.   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   5-HTN; continue with Cardizem, lasix metolazone.   Hypothyroidism; continue with synthroid;  Chronic diastolic HF; Continue with metolazone, lasix every other day. Compensated.   Chronic Respiratory Failure;  On chronic 4 L of oxygen.   GERD;    DVT prophylaxis: Coumadin Code Status: DNR Family Communication: Care discussed with patient.  Disposition Plan: To be determine.   Consultants:   Vascular.    Procedures:      Antimicrobials:   Zosyn 06-27-2017  Vancomycin 06-27-2017   Subjective: She is feeling better. Right foot pain has improved, swelling decreasing.    Objective: Vitals:   06/28/17 1812 06/28/17 2053 06/29/17 0422 06/29/17 0756  BP: (!) 106/57 117/65 (!) 98/51 115/60  Pulse: 85 92 78 84  Resp: 16 16 16    Temp: 98 F (36.7 C) 98 F (36.7 C) (!) 97.5 F (36.4 C)   TempSrc: Oral Oral Oral   SpO2: 93% 91% 94% 93%  Weight:      Height:        Intake/Output Summary (Last 24 hours) at 06/29/2017 0955 Last data  filed at 06/29/2017 0700 Gross per 24 hour  Intake 370 ml  Output 1000 ml  Net -630 ml   Filed Weights   06/27/17 1712 06/27/17 2236  Weight: 73.5 kg (162 lb) 72.1 kg (159 lb)    Examination:  General exam: NAD Respiratory system: Respiratory effort normal , CTA Cardiovascular system: S 1, S 2 RRR Gastrointestinal system: BS present, soft, nt, nd Central nervous system: alert, non focal.  Extremities:  Symmetric 5 x 5 power. Skin: right foot with less redness, gangrenosus 4 metatarsal, 2 open wound  along incision line, right heel pressure injury.   Data Reviewed: I have personally reviewed following labs and imaging studies  CBC: Recent Labs  Lab 06/27/17 1742 06/28/17 0715 06/29/17 0638  WBC 12.6* 11.6* 10.4  NEUTROABS 11.0*  --   --   HGB 9.1* 8.5* 8.4*  HCT 31.8* 29.1* 29.5*  MCV 90.6 87.7 89.1  PLT 497* 440* 235*   Basic Metabolic Panel: Recent Labs  Lab 06/27/17 1742 06/28/17 0715 06/29/17 0638  NA 142 139 140  K 3.3* 3.4* 3.4*  CL 103 102 103  CO2 30 28 30   GLUCOSE 89 78 79  BUN 33* 27* 24*  CREATININE 1.37* 1.30* 1.49*  CALCIUM 8.6* 8.3* 8.3*   GFR: Estimated Creatinine Clearance: 37.7 mL/min (A) (by C-G formula based on SCr of 1.49 mg/dL (H)). Liver Function Tests: No results for input(s): AST, ALT, ALKPHOS, BILITOT, PROT, ALBUMIN in the last 168 hours. No results for input(s): LIPASE, AMYLASE in the last 168 hours. No results for input(s): AMMONIA in the last 168 hours. Coagulation Profile: Recent Labs  Lab 06/27/17 1742 06/28/17 0715 06/29/17 0638  INR 2.64 2.56 2.74   Cardiac Enzymes: No results for input(s): CKTOTAL, CKMB, CKMBINDEX, TROPONINI in the last 168 hours. BNP (last 3 results) No results for input(s): PROBNP in the last 8760 hours. HbA1C: No results for input(s): HGBA1C in the last 72 hours. CBG: No results for input(s): GLUCAP in the last 168 hours. Lipid Profile: No results for input(s): CHOL, HDL, LDLCALC, TRIG, CHOLHDL, LDLDIRECT in the last 72 hours. Thyroid Function Tests: No results for input(s): TSH, T4TOTAL, FREET4, T3FREE, THYROIDAB in the last 72 hours. Anemia Panel: No results for input(s): VITAMINB12, FOLATE, FERRITIN, TIBC, IRON, RETICCTPCT in the last 72 hours. Sepsis Labs: Recent Labs  Lab 06/27/17 2103  LATICACIDVEN 1.44    Recent Results (from the past 240 hour(s))  Culture, blood (Routine X 2) w Reflex to  ID Panel     Status: None (Preliminary result)   Collection Time: 06/27/17  8:23 PM  Result Value Ref Range Status   Specimen Description LEFT ANTECUBITAL  Final   Special Requests   Final    BOTTLES DRAWN AEROBIC AND ANAEROBIC Blood Culture adequate volume   Culture NO GROWTH 1 DAY  Final   Report Status PENDING  Incomplete  Culture, blood (Routine X 2) w Reflex to ID Panel     Status: None (Preliminary result)   Collection Time: 06/27/17  8:28 PM  Result Value Ref Range Status   Specimen Description BLOOD RIGHT ARM  Final   Special Requests   Final    BOTTLES DRAWN AEROBIC ONLY Blood Culture adequate volume   Culture  Setup Time   Final    GRAM POSITIVE COCCI Gram Stain Report Called to,Read Back By and Verified With: NURSE TULIAO,C. NOTIFIED AT 0026 ON 06/29/2017 BY EVA AEROBIC BOTTLE ONLY Performed at [place],  READ BACK BY AND VERIFIED WITH: Ailene Rud AT [id number] 06/29/17 BY L BENFIELD Performed at [facility], [place] [address], [place], [place] [address]    Culture GRAM POSITIVE COCCI  Final   Report Status PENDING  Incomplete  Blood Culture ID Panel (Reflexed)     Status: Abnormal   Collection Time: 06/27/17  8:28 PM  Result Value Ref Range Status   Enterococcus species NOT DETECTED NOT DETECTED Final   Listeria monocytogenes NOT DETECTED NOT DETECTED Final   Staphylococcus species DETECTED (A) NOT DETECTED Final    Comment: Methicillin (oxacillin) resistant coagulase negative staphylococcus. Possible blood culture contaminant (unless isolated from more than one blood culture draw or clinical case suggests pathogenicity). No antibiotic treatment is indicated for blood  culture contaminants. CRITICAL RESULT CALLED TO, READ BACK BY AND VERIFIED WITH: Ailene Rud AT [id number] 06/29/17 BY L BENFIELD    Staphylococcus aureus NOT DETECTED NOT DETECTED Final   Methicillin resistance DETECTED (A) NOT DETECTED Final    Comment: CRITICAL  RESULT CALLED TO, READ BACK BY AND VERIFIED WITH: Ailene Rud AT [id number] 06/29/17 BY L BENFIELD    Streptococcus species NOT DETECTED NOT DETECTED Final   Streptococcus agalactiae NOT DETECTED NOT DETECTED Final   Streptococcus pneumoniae NOT DETECTED NOT DETECTED Final   Streptococcus pyogenes NOT DETECTED NOT DETECTED Final   Acinetobacter baumannii NOT DETECTED NOT DETECTED Final   Enterobacteriaceae species NOT DETECTED NOT DETECTED Final   Enterobacter cloacae complex NOT DETECTED NOT DETECTED Final   Escherichia coli NOT DETECTED NOT DETECTED Final   Klebsiella oxytoca NOT DETECTED NOT DETECTED Final   Klebsiella pneumoniae NOT DETECTED NOT DETECTED Final   Proteus species NOT DETECTED NOT DETECTED Final   Serratia marcescens NOT DETECTED NOT DETECTED Final   Haemophilus influenzae NOT DETECTED NOT DETECTED Final   Neisseria meningitidis NOT DETECTED NOT DETECTED Final   Pseudomonas aeruginosa NOT DETECTED NOT DETECTED Final   Candida albicans NOT DETECTED NOT DETECTED Final   Candida glabrata NOT DETECTED NOT DETECTED Final   Candida krusei NOT DETECTED NOT DETECTED Final   Candida parapsilosis NOT DETECTED NOT DETECTED Final   Candida tropicalis NOT DETECTED NOT DETECTED Final    Comment: Performed at [facility], [place]. [address] [place], [place] [address]  MRSA PCR Screening     Status: None   Collection Time: 06/28/17 11:18 AM  Result Value Ref Range Status   MRSA by PCR NEGATIVE NEGATIVE Final    Comment:        The GeneXpert MRSA Assay (FDA approved for NASAL specimens only), is one component of a comprehensive MRSA colonization surveillance program. It is not intended to diagnose MRSA infection nor to guide or monitor treatment for MRSA infections.          Radiology Studies: Dg Tibia/fibula Right  Result Date: 06/27/2017 CLINICAL DATA:  Pain and soft tissue infection. EXAM: RIGHT TIBIA AND FIBULA - 2 VIEW COMPARISON:  None. FINDINGS: Skin/soft  tissue defects of the upper medial aspect of the lower leg. No tracking soft tissue air. Surgical clips but no radiopaque foreign body. Diffuse soft tissue edema of the lower extremity. No acute osseous abnormality or bony destructive change. IMPRESSION: Skin/soft tissue defects of the upper medial leg may reflect ulcers. No tracking soft tissue air, radiopaque foreign body, or underlying osseous abnormality. Electronically Signed   By: Jeb Levering M.D.   On: 06/27/2017 19:15   Mr Foot Right Wo Contrast  Result Date: 06/28/2017  CLINICAL DATA:  Right forefoot pain and swelling. The patient's right fourth toe is black and she has an open wound on the heel. The patient reports right foot pain for several weeks which has worsened over the past 2 weeks. History of peripheral artery disease. EXAM: MRI OF THE RIGHT FOREFOOT WITHOUT CONTRAST TECHNIQUE: Multiplanar, multisequence MR imaging of the right forefoot. Was performed. No intravenous contrast was administered. COMPARISON:  Plain films of the right foot 06/27/2017. FINDINGS: Bones/Joint/Cartilage Medullary infarcts are present in all imaged bones. There is marrow edema in the periphery of the head of the fifth metatarsal subjacent to a skin wound. Ligaments Intact. Muscles and Tendons Diffusely atrophic. No intramuscular fluid collection. No muscle or tendon tear. Soft tissues Subcutaneous edema is present over the dorsum of the foot. No abscess. IMPRESSION: Skin wound adjacent to the head of the fifth metatarsal. Marrow edema in the the periphery of the head of fifth metatarsal could be due to osteomyelitis. Assessment for osteomyelitis is markedly limited as the patient has medullary infarcts in all imaged bones. No focal abnormality in the fourth toe with medullary infarcts throughout. Negative for soft tissue abscess or septic joint. Electronically Signed   By: Inge Rise M.D.   On: 06/28/2017 11:50   Mr Ankle Right Wo Contrast  Result Date:  06/28/2017 CLINICAL DATA:  Right forefoot pain and swelling. The patient's right fourth toe is black and she has an open wound on the heel. The patient reports right foot pain for several weeks which has worsened over the past 2 weeks. History of peripheral artery disease. EXAM: MRI OF THE RIGHT ANKLE WITHOUT CONTRAST TECHNIQUE: Multiplanar, multisequence MR imaging of the ankle was performed. No intravenous contrast was administered. COMPARISON:  None. FINDINGS: TENDONS Peroneal: Intact. Posteromedial: Intact. Anterior: Intact. Achilles: Intact. Plantar Fascia: Intact and normal in appearance. LIGAMENTS Lateral: Intact. Medial: Intact. CARTILAGE Ankle Joint: Negative. Subtalar Joints/Sinus Tarsi: Negative. Bones: Medullary infarcts are seen in all imaged bones. No focal marrow edema to suggest osteomyelitis is identified. Other: The patient's skin wound over the heel is not visualized. No abscess is seen. 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 Negative for abscess or septic joint. Electronically Signed   By: Inge Rise M.D.   On: 06/28/2017 11:54   Dg Foot Complete Right  Result Date: 06/27/2017 CLINICAL DATA:  Pain, soft tissue infection.  Necrotic toes. EXAM: RIGHT FOOT COMPLETE - 3+ VIEW COMPARISON:  None. FINDINGS: Atrophic soft tissues of the distal fourth digit with question of osseous demineralization of the distal phalanx. No tracking soft tissue air. Fifth digit distal phalanx is truncated in a chronic appearance. Soft tissue irregularity noted about the lateral foot adjacent to the distal fifth metatarsal. Degenerative change of the first ray. No fracture or dislocation. Vascular calcifications. Dorsal soft tissue edema. IMPRESSION: Atrophic soft tissues of the distal fourth digit with question of distal phalanx demineralization, possible osteomyelitis. Skin irregularity of the lateral foot about the distal fifth metatarsal without  underlying bony changes. Electronically Signed   By: Jeb Levering M.D.   On: 06/27/2017 19:14        Scheduled Meds: . aspirin EC  81 mg Oral Daily  . diltiazem  240 mg Oral Daily  . furosemide  40 mg Oral QODAY  . gabapentin  100 mg Oral TID  . hydroxyurea  1,000 mg Oral Daily  . levothyroxine  75 mcg Oral QAC breakfast  . metolazone  2.5 mg Oral  Q M,W,F  . pravastatin  10 mg Oral QPM  . Warfarin - Pharmacist Dosing Inpatient   Does not apply q1800   Continuous Infusions: . piperacillin-tazobactam (ZOSYN)  IV 3.375 g (06/29/17 0420)  . vancomycin Stopped (06/28/17 2227)     LOS: 2 days    Time spent: 35 minutes.     Elmarie Shiley, MD [facility] Pager [phone]  If 7PM-7AM, please contact night-coverage [email or web address] Password [facility] 06/29/2017, 9:55 AM

## 2017-06-29 NOTE — Consult Note (Addendum)
[facility] for Infectious Disease  Total days of antibiotics 3        Day 3 piptazo/vanco               Reason for Consult: bacteremia and possible osteo   Referring Physician: regalado  Principal Problem:   Cellulitis and abscess of right leg Active Problems:   Chronic atrial fibrillation (HCC)   Anticoagulant long-term use   HTN (hypertension)   Hypothyroidism   Peripheral vascular disease (HCC)   Hard of hearing   CHF (congestive heart failure) (HCC)   Chronic respiratory failure with hypoxia (HCC)   Lower extremity arterial insufficiency, severe, right (HCC)   GERD (gastroesophageal reflux disease)   S/P femoral-popliteal bypass surgery   Dry gangrene ([place])   Hypokalemia   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 female with history of PAD, AFib, s/p fem-pop bypass in August 2018 she was admitted on 11/24 for worsening right foot pain that has progressed over the last 2 wks. She has history of dry gangrene to lateral aspect of right foot and 4th digit but has had good follow up with dr. [name] with good revascularization + dopplers, warm extremities. She has dome pain with ambulation. On admit, she is afebrile but WBC of 12.6K, her foot appeared to still have dry eschar, some erythema and swelling to dorsum of right foot. Her blood cx showed 1 of 3 bottles + MRSE. She was empirically started on vanco/piptazo. Patient feels some improvement since admission. Vascular team has seen patient and feel that her presentation is not related to ischemia and has good function of bypass graft. Patient still has slowly healing areas from dehiscence of surgical incision site from bypass grafting on upper right leg. Wound care has made recommendations of management of these wounds.  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 Anxiety   . Arthritis   . Atrial  fibrillation, chronic (HCC)    a. on Coumadin for anticoagulation.   . CHF (congestive heart failure) ([place])    combined  . COPD (chronic obstructive pulmonary disease) ([place])    on home O2  . Deaf   . Dry gangrene ([place])    R foot  . DVT (deep venous thrombosis) ([place])   . Essential hypertension   . GERD (gastroesophageal reflux disease)   . Hyperlipidemia   . Hypothyroidism   . Peripheral neuropathy   . Peripheral vascular disease ([place])   . 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 Ulcer of ankle ([place])    Bilateral 2015  . Varicose veins     Allergies: No Known Allergies  MEDICATIONS: . aspirin EC  81 mg Oral Daily  . diltiazem  240 mg Oral Daily  . furosemide  40 mg Oral QODAY  . gabapentin  100 mg Oral TID  . hydroxyurea  1,000 mg Oral Daily  . levothyroxine  75 mcg Oral QAC breakfast  . metolazone  2.5 mg Oral Q M,W,F  . pravastatin  10 mg Oral QPM  . warfarin  2.5 mg Oral ONCE-1800  . Warfarin - Pharmacist Dosing Inpatient   Does not apply q1800    Social History   Tobacco Use  . Smoking status: Never Smoker  . Smokeless tobacco: Never Used  Substance Use Topics  . Alcohol use: No    Alcohol/week: 0.0 oz  .  Drug use: No    Family History  Problem Relation Age of Onset  . Heart failure Mother   . Heart disease Mother   . Stroke Father   . Heart disease Sister   . Diabetes Son   . Hypertension Sister   . Hypertension Sister   . Stroke Sister     Review of Systems  Constitutional: Negative for fever, chills, diaphoresis, activity change, appetite change, fatigue and unexpected weight change.  HENT: Negative for congestion, sore throat, rhinorrhea, sneezing, trouble swallowing and sinus pressure.  Eyes: Negative for photophobia and visual disturbance.  Respiratory: Negative for cough, chest tightness, shortness of breath, wheezing and stridor.  Cardiovascular: Negative for chest pain, palpitations and leg swelling.  Gastrointestinal: Negative for nausea,  vomiting, abdominal pain, diarrhea, constipation, blood in stool, abdominal distention and anal bleeding.  Genitourinary: Negative for dysuria, hematuria, flank pain and difficulty urinating.  Musculoskeletal: + right leg pain and swollen foot. Negative for myalgias, back pain, joint swelling, arthralgias and gait problem.  Skin: Negative for color change, pallor, rash and wound.  Neurological: Negative for dizziness, tremors, weakness and light-headedness.  Hematological: Negative for adenopathy. Does not bruise/bleed easily.  Psychiatric/Behavioral: Negative for behavioral problems, confusion, sleep disturbance, dysphoric mood, decreased concentration and agitation.     OBJECTIVE: Temp:  [97.5 F (36.4 C)-98.1 F (36.7 C)] 98.1 F (36.7 C) (11/26 1300) Pulse Rate:  [73-92] 73 (11/26 1300) Resp:  [16-20] 20 (11/26 1300) BP: (96-117)/(49-65) 96/49 (11/26 1300) SpO2:  [91 %-99 %] 99 % (11/26 1300) Physical Exam  Constitutional:  oriented to person, place, and time. appears well-developed and well-nourished. No distress.  HENT: /AT, PERRLA, no scleral icterus Mouth/Throat: Oropharynx is clear and moist. No oropharyngeal exudate.  Cardiovascular: Normal rate, regular rhythm and normal heart sounds. Exam reveals no gallop and no friction rub.  No murmur heard.  Pulmonary/Chest: Effort normal and breath sounds normal. No respiratory distress.  has no wheezes.  Neck = supple, no nuchal rigidity Abdominal: Soft. Bowel sounds are normal.  exhibits no distension. There is no tenderness.  Ext: right leg has small opening, clean wound bed to surgical incision from bypass surgery. Dry 3cm eschar to lateral aspect of right foot. 4th digit has eschar, swelling to dorsum of foot Neurological: alert and oriented to person, place, and time.  Skin: blanching erythema to right foot  Psychiatric: a normal mood and affect.  behavior is normal.    LABS: Results for orders placed or performed during  the hospital encounter of 06/27/17 (from the past 48 hour(s))  CBC with Differential/Platelet     Status: Abnormal   Collection Time: 06/27/17  5:42 PM  Result Value Ref Range   WBC 12.6 (H) 4.0 - 10.5 K/uL   RBC 3.51 (L) 3.87 - 5.11 MIL/uL   Hemoglobin 9.1 (L) 12.0 - 15.0 g/dL   HCT 31.8 (L) 36.0 - 46.0 %   MCV 90.6 78.0 - 100.0 fL   MCH 25.9 (L) 26.0 - 34.0 pg   MCHC 28.6 (L) 30.0 - 36.0 g/dL   RDW 25.9 (H) 11.5 - 15.5 %   Platelets 497 (H) 150 - 400 K/uL   Neutrophils Relative % 87 %   Neutro Abs 11.0 (H) 1.7 - 7.7 K/uL   Lymphocytes Relative 8 %   Lymphs Abs 1.0 0.7 - 4.0 K/uL   Monocytes Relative 2 %   Monocytes Absolute 0.2 0.1 - 1.0 K/uL   Eosinophils Relative 2 %   Eosinophils Absolute 0.2 0.0 -  0.7 K/uL   Basophils Relative 1 %   Basophils Absolute 0.2 (H) 0.0 - 0.1 K/uL  Basic metabolic panel     Status: Abnormal   Collection Time: 06/27/17  5:42 PM  Result Value Ref Range   Sodium 142 135 - 145 mmol/L   Potassium 3.3 (L) 3.5 - 5.1 mmol/L   Chloride 103 101 - 111 mmol/L   CO2 30 22 - 32 mmol/L   Glucose, Bld 89 65 - 99 mg/dL   BUN 33 (H) 6 - 20 mg/dL   Creatinine, Ser 1.37 (H) 0.44 - 1.00 mg/dL   Calcium 8.6 (L) 8.9 - 10.3 mg/dL   GFR calc non Af Amer 37 (L) >60 mL/min   GFR calc Af Amer 43 (L) >60 mL/min    Comment: (NOTE) The eGFR has been calculated using the CKD EPI equation. This calculation has not been validated in all clinical situations. eGFR's persistently <60 mL/min signify possible Chronic Kidney Disease.    Anion gap 9 5 - 15  Protime-INR     Status: Abnormal   Collection Time: 06/27/17  5:42 PM  Result Value Ref Range   Prothrombin Time 28.0 (H) 11.4 - 15.2 seconds   INR 2.64   Culture, blood (Routine X 2) w Reflex to ID Panel     Status: None (Preliminary result)   Collection Time: 06/27/17  8:23 PM  Result Value Ref Range   Specimen Description LEFT ANTECUBITAL    Special Requests      BOTTLES DRAWN AEROBIC AND ANAEROBIC Blood Culture  adequate volume   Culture NO GROWTH 1 DAY    Report Status PENDING   Culture, blood (Routine X 2) w Reflex to ID Panel     Status: None (Preliminary result)   Collection Time: 06/27/17  8:28 PM  Result Value Ref Range   Specimen Description BLOOD RIGHT ARM    Special Requests      BOTTLES DRAWN AEROBIC ONLY Blood Culture adequate volume   Culture  Setup Time      GRAM POSITIVE COCCI Gram Stain Report Called to,Read Back By and Verified With: NURSE TULIAO,C. NOTIFIED AT 0026 ON 06/29/2017 BY [facility]A AEROBIC BOTTLE ONLY Performed at [facility], READ BACK BY AND VERIFIED WITH: Ailene Rud AT 9169 06/29/17 BY L BENFIELD Performed at [facility], [place] [address], [place], [place] [address]    Culture GRAM POSITIVE COCCI    Report Status PENDING   Blood Culture ID Panel (Reflexed)     Status: Abnormal   Collection Time: 06/27/17  8:28 PM  Result Value Ref Range   Enterococcus species NOT DETECTED NOT DETECTED   Listeria monocytogenes NOT DETECTED NOT DETECTED   Staphylococcus species DETECTED (A) NOT DETECTED    Comment: Methicillin (oxacillin) resistant coagulase negative staphylococcus. Possible blood culture contaminant (unless isolated from more than one blood culture draw or clinical case suggests pathogenicity). No antibiotic treatment is indicated for blood  culture contaminants. CRITICAL RESULT CALLED TO, READ BACK BY AND VERIFIED WITH: M MACCIA,PHARMD AT 8828 06/29/17 BY L BENFIELD    Staphylococcus aureus NOT DETECTED NOT DETECTED   Methicillin resistance DETECTED (A) NOT DETECTED    Comment: CRITICAL RESULT CALLED TO, READ BACK BY AND VERIFIED WITH: Ailene Rud AT 0034 06/29/17 BY L BENFIELD    Streptococcus species NOT DETECTED NOT DETECTED   Streptococcus agalactiae NOT DETECTED NOT DETECTED   Streptococcus pneumoniae NOT DETECTED NOT DETECTED   Streptococcus pyogenes NOT  DETECTED NOT DETECTED   Acinetobacter baumannii NOT  DETECTED NOT DETECTED   Enterobacteriaceae species NOT DETECTED NOT DETECTED   Enterobacter cloacae complex NOT DETECTED NOT DETECTED   Escherichia coli NOT DETECTED NOT DETECTED   Klebsiella oxytoca NOT DETECTED NOT DETECTED   Klebsiella pneumoniae NOT DETECTED NOT DETECTED   Proteus species NOT DETECTED NOT DETECTED   Serratia marcescens NOT DETECTED NOT DETECTED   Haemophilus influenzae NOT DETECTED NOT DETECTED   Neisseria meningitidis NOT DETECTED NOT DETECTED   Pseudomonas aeruginosa NOT DETECTED NOT DETECTED   Candida albicans NOT DETECTED NOT DETECTED   Candida glabrata NOT DETECTED NOT DETECTED   Candida krusei NOT DETECTED NOT DETECTED   Candida parapsilosis NOT DETECTED NOT DETECTED   Candida tropicalis NOT DETECTED NOT DETECTED    Comment: Performed at [facility], [place] [address] [place], [place] [address]  I-Stat CG4 Lactic Acid, ED     Status: None   Collection Time: 06/27/17  9:03 PM  Result Value Ref Range   Lactic Acid, Venous 1.44 0.5 - 1.9 mmol/L  Basic metabolic panel     Status: Abnormal   Collection Time: 06/28/17  7:15 AM  Result Value Ref Range   Sodium 139 135 - 145 mmol/L   Potassium 3.4 (L) 3.5 - 5.1 mmol/L   Chloride 102 101 - 111 mmol/L   CO2 28 22 - 32 mmol/L   Glucose, Bld 78 65 - 99 mg/dL   BUN 27 (H) 6 - 20 mg/dL   Creatinine, Ser 1.30 (H) 0.44 - 1.00 mg/dL   Calcium 8.3 (L) 8.9 - 10.3 mg/dL   GFR calc non Af Amer 39 (L) >60 mL/min   GFR calc Af Amer 46 (L) >60 mL/min    Comment: (NOTE) The eGFR has been calculated using the CKD EPI equation. This calculation has not been validated in all clinical situations. eGFR's persistently <60 mL/min signify possible Chronic Kidney Disease.    Anion gap 9 5 - 15  CBC     Status: Abnormal   Collection Time: 06/28/17  7:15 AM  Result Value Ref Range   WBC 11.6 (H) 4.0 - 10.5 K/uL   RBC 3.32 (L) 3.87 - 5.11 MIL/uL   Hemoglobin 8.5 (L) 12.0 - 15.0 g/dL   HCT 29.1 (L) 36.0 - 46.0 %   MCV 87.7  78.0 - 100.0 fL   MCH 25.6 (L) 26.0 - 34.0 pg   MCHC 29.2 (L) 30.0 - 36.0 g/dL   RDW 25.6 (H) 11.5 - 15.5 %   Platelets 440 (H) 150 - 400 K/uL  Protime-INR     Status: Abnormal   Collection Time: 06/28/17  7:15 AM  Result Value Ref Range   Prothrombin Time 27.3 (H) 11.4 - 15.2 seconds   INR 2.56   MRSA PCR Screening     Status: None   Collection Time: 06/28/17 11:18 AM  Result Value Ref Range   MRSA by PCR NEGATIVE NEGATIVE    Comment:        The GeneXpert MRSA Assay (FDA approved for NASAL specimens only), is one component of a comprehensive MRSA colonization surveillance program. It is not intended to diagnose MRSA infection nor to guide or monitor treatment for MRSA infections.   Protime-INR     Status: Abnormal   Collection Time: 06/29/17  6:38 AM  Result Value Ref Range   Prothrombin Time 28.8 (H) 11.4 - 15.2 seconds   INR 2.74   CBC  Status: Abnormal   Collection Time: 06/29/17  6:38 AM  Result Value Ref Range   WBC 10.4 4.0 - 10.5 K/uL   RBC 3.31 (L) 3.87 - 5.11 MIL/uL   Hemoglobin 8.4 (L) 12.0 - 15.0 g/dL   HCT 29.5 (L) 36.0 - 46.0 %   MCV 89.1 78.0 - 100.0 fL   MCH 25.4 (L) 26.0 - 34.0 pg   MCHC 28.5 (L) 30.0 - 36.0 g/dL   RDW 26.0 (H) 11.5 - 15.5 %   Platelets 496 (H) 150 - 400 K/uL  Basic metabolic panel     Status: Abnormal   Collection Time: 06/29/17  6:38 AM  Result Value Ref Range   Sodium 140 135 - 145 mmol/L   Potassium 3.4 (L) 3.5 - 5.1 mmol/L   Chloride 103 101 - 111 mmol/L   CO2 30 22 - 32 mmol/L   Glucose, Bld 79 65 - 99 mg/dL   BUN 24 (H) 6 - 20 mg/dL   Creatinine, Ser 1.49 (H) 0.44 - 1.00 mg/dL   Calcium 8.3 (L) 8.9 - 10.3 mg/dL   GFR calc non Af Amer 33 (L) >60 mL/min   GFR calc Af Amer 39 (L) >60 mL/min    Comment: (NOTE) The eGFR has been calculated using the CKD EPI equation. This calculation has not been validated in all clinical situations. eGFR's persistently <60 mL/min signify possible Chronic Kidney Disease.    Anion gap  7 5 - 15    MICRO:  IMAGING: Dg Tibia/fibula Right  Result Date: 06/27/2017 CLINICAL DATA:  Pain and soft tissue infection. EXAM: RIGHT TIBIA AND FIBULA - 2 VIEW COMPARISON:  None. FINDINGS: Skin/soft tissue defects of the upper medial aspect of the lower leg. No tracking soft tissue air. Surgical clips but no radiopaque foreign body. Diffuse soft tissue edema of the lower extremity. No acute osseous abnormality or bony destructive change. IMPRESSION: Skin/soft tissue defects of the upper medial leg may reflect ulcers. No tracking soft tissue air, radiopaque foreign body, or underlying osseous abnormality. Electronically Signed   By: Jeb Levering M.D.   On: 06/27/2017 19:15   Mr Foot Right Wo Contrast  Result Date: 06/28/2017 CLINICAL DATA:  Right forefoot pain and swelling. The patient's right fourth toe is black and she has an open wound on the heel. The patient reports right foot pain for several weeks which has worsened over the past 2 weeks. History of peripheral artery disease. EXAM: MRI OF THE RIGHT FOREFOOT WITHOUT CONTRAST TECHNIQUE: Multiplanar, multisequence MR imaging of the right forefoot. Was performed. No intravenous contrast was administered. COMPARISON:  Plain films of the right foot 06/27/2017. FINDINGS: Bones/Joint/Cartilage Medullary infarcts are present in all imaged bones. There is marrow edema in the periphery of the head of the fifth metatarsal subjacent to a skin wound. Ligaments Intact. Muscles and Tendons Diffusely atrophic. No intramuscular fluid collection. No muscle or tendon tear. Soft tissues Subcutaneous edema is present over the dorsum of the foot. No abscess. IMPRESSION: Skin wound adjacent to the head of the fifth metatarsal. Marrow edema in the the periphery of the head of fifth metatarsal could be due to osteomyelitis. Assessment for osteomyelitis is markedly limited as the patient has medullary infarcts in all imaged bones. No focal abnormality in the fourth  toe with medullary infarcts throughout. Negative for soft tissue abscess or septic joint. Electronically Signed   By: Inge Rise M.D.   On: 06/28/2017 11:50   Mr Ankle Right Wo Contrast  Result Date:  06/28/2017 CLINICAL DATA:  Right forefoot pain and swelling. The patient's right fourth toe is black and she has an open wound on the heel. The patient reports right foot pain for several weeks which has worsened over the past 2 weeks. History of peripheral artery disease. EXAM: MRI OF THE RIGHT ANKLE WITHOUT CONTRAST TECHNIQUE: Multiplanar, multisequence MR imaging of the ankle was performed. No intravenous contrast was administered. COMPARISON:  None. FINDINGS: TENDONS Peroneal: Intact. Posteromedial: Intact. Anterior: Intact. Achilles: Intact. Plantar Fascia: Intact and normal in appearance. LIGAMENTS Lateral: Intact. Medial: Intact. CARTILAGE Ankle Joint: Negative. Subtalar Joints/Sinus Tarsi: Negative. Bones: Medullary infarcts are seen in all imaged bones. No focal marrow edema to suggest osteomyelitis is identified. Other: The patient's skin wound over the heel is not visualized. No abscess is seen. 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 Negative for abscess or septic joint. Electronically Signed   By: Inge Rise M.D.   On: 06/28/2017 11:54   Dg Foot Complete Right  Result Date: 06/27/2017 CLINICAL DATA:  Pain, soft tissue infection.  Necrotic toes. EXAM: RIGHT FOOT COMPLETE - 3+ VIEW COMPARISON:  None. FINDINGS: Atrophic soft tissues of the distal fourth digit with question of osseous demineralization of the distal phalanx. No tracking soft tissue air. Fifth digit distal phalanx is truncated in a chronic appearance. Soft tissue irregularity noted about the lateral foot adjacent to the distal fifth metatarsal. Degenerative change of the first ray. No fracture or dislocation. Vascular calcifications. Dorsal soft tissue edema.  IMPRESSION: Atrophic soft tissues of the distal fourth digit with question of distal phalanx demineralization, possible osteomyelitis. Skin irregularity of the lateral foot about the distal fifth metatarsal without underlying bony changes. Electronically Signed   By: Jeb Levering M.D.   On: 06/27/2017 19:14    Assessment/Plan:  76yo F with PAD s/p fem-pop bypass surgery in August and presence of dry gangrene from right foot and slowly healing surgical site admitted for leg pain found to have cellulitis. Imaging suggest distal phalanx demineralization that corresponds to dry gangrene. Physical exam suggestive of cellulitis. Blood cx suggestive of contaminant with 1 of 3 bottles of MRSE  Cellulitis = would d/c Iv vanco and piptazo and change her to cephalexin '500mg'$  po TID x 7 more days  MRSE bacteremia = contaminant. Would not treat  Dry gangrene/healing surgical site = continue with wound care recs  CKD 3= will renally adjust her cephalexin dose

## 2017-06-30 ENCOUNTER — Other Ambulatory Visit: Payer: Self-pay

## 2017-06-30 ENCOUNTER — Encounter (HOSPITAL_COMMUNITY): Payer: Self-pay | Admitting: General Practice

## 2017-06-30 DIAGNOSIS — N183 Chronic kidney disease, stage 3 unspecified: Secondary | ICD-10-CM

## 2017-06-30 DIAGNOSIS — M79671 Pain in right foot: Secondary | ICD-10-CM

## 2017-06-30 LAB — CBC
HCT: 29.3 % — ABNORMAL LOW (ref 36.0–46.0)
Hemoglobin: 8.4 g/dL — ABNORMAL LOW (ref 12.0–15.0)
MCH: 25.3 pg — ABNORMAL LOW (ref 26.0–34.0)
MCHC: 28.7 g/dL — AB (ref 30.0–36.0)
MCV: 88.3 fL (ref 78.0–100.0)
PLATELETS: 534 10*3/uL — AB (ref 150–400)
RBC: 3.32 MIL/uL — ABNORMAL LOW (ref 3.87–5.11)
RDW: 25.3 % — AB (ref 11.5–15.5)
WBC: 10.6 10*3/uL — ABNORMAL HIGH (ref 4.0–10.5)

## 2017-06-30 LAB — BASIC METABOLIC PANEL
Anion gap: 8 (ref 5–15)
BUN: 25 mg/dL — AB (ref 6–20)
CALCIUM: 8.3 mg/dL — AB (ref 8.9–10.3)
CO2: 27 mmol/L (ref 22–32)
CREATININE: 1.52 mg/dL — AB (ref 0.44–1.00)
Chloride: 103 mmol/L (ref 101–111)
GFR calc non Af Amer: 33 mL/min — ABNORMAL LOW (ref >60)
GFR, EST AFRICAN AMERICAN: 38 mL/min — AB (ref >60)
Glucose, Bld: 78 mg/dL (ref 65–99)
Potassium: 3.3 mmol/L — ABNORMAL LOW (ref 3.5–5.1)
SODIUM: 138 mmol/L (ref 135–145)

## 2017-06-30 LAB — PROTIME-INR
INR: 2.44
PROTHROMBIN TIME: 26.3 s — AB (ref 11.4–15.2)

## 2017-06-30 MED ORDER — CEPHALEXIN 500 MG PO CAPS: 500.0000 mg | ORAL_CAPSULE | Freq: Three times a day (TID) | ORAL | 0 refills | Status: DC

## 2017-06-30 MED ORDER — POTASSIUM CHLORIDE CRYS ER 20 MEQ PO TBCR
20.0000 meq | EXTENDED_RELEASE_TABLET | Freq: Once | ORAL | Status: AC
  Administered 2017-06-30: 20 meq via ORAL
  Filled 2017-06-30: qty 1

## 2017-06-30 MED ORDER — WARFARIN SODIUM 5 MG PO TABS: 5.0000 mg | ORAL_TABLET | Freq: Once | ORAL | Status: DC

## 2017-06-30 MED ORDER — CEPHALEXIN 500 MG PO CAPS
500.0000 mg | ORAL_CAPSULE | Freq: Three times a day (TID) | ORAL | Status: DC
  Administered 2017-06-30: 500 mg via ORAL
  Filled 2017-06-30: qty 1

## 2017-06-30 MED ORDER — WARFARIN SODIUM 2.5 MG PO TABS: 2.5000 mg | ORAL_TABLET | Freq: Every day | ORAL | 0 refills | Status: DC

## 2017-06-30 MED ORDER — OXYCODONE HCL 5 MG PO TABS: 5.0000 mg | ORAL_TABLET | Freq: Four times a day (QID) | ORAL | 0 refills | Status: DC | PRN

## 2017-06-30 NOTE — Progress Notes (Signed)
    [facility] for Infectious Disease    Date of Admission:  06/27/2017   Total days of antibiotics 4           ID: Charlotte [name] is a 76 y.o. female with  Principal Problem:   Cellulitis and abscess of right leg Active Problems:   Chronic atrial fibrillation (HCC)   Anticoagulant long-term use   HTN (hypertension)   Hypothyroidism   Peripheral vascular disease (HCC)   Hard of hearing   CHF (congestive heart failure) (HCC)   Chronic respiratory failure with hypoxia (HCC)   Lower extremity arterial insufficiency, severe, right (HCC)   GERD (gastroesophageal reflux disease)   S/P femoral-popliteal bypass surgery   Dry gangrene (HCC)   Hypokalemia   Osteomyelitis of right foot (HCC)    Subjective: Feeling better, less right foot pain  Medications:  . aspirin EC  81 mg Oral Daily  . cephALEXin  500 mg Oral Q8H  . diltiazem  240 mg Oral Daily  . furosemide  40 mg Oral QODAY  . gabapentin  100 mg Oral TID  . hydroxyurea  1,000 mg Oral Daily  . levothyroxine  75 mcg Oral QAC breakfast  . metolazone  2.5 mg Oral Q M,W,F  . pravastatin  10 mg Oral QPM  . warfarin  5 mg Oral ONCE-1800  . Warfarin - Pharmacist Dosing Inpatient   Does not apply q1800    Objective: Vital signs in last 24 hours: Temp:  [97.5 F (36.4 C)-97.9 F (36.6 C)] 97.9 F (36.6 C) (11/27 1411) Pulse Rate:  [80-90] 80 (11/27 1411) Resp:  [18-20] 18 (11/27 1411) BP: (100-126)/(53-78) 100/53 (11/27 1411) SpO2:  [90 %-92 %] 92 % (11/27 1411) gen = a x o by 4 HEENT = hard of hearing Ext: right foot is no longer erythematous. Still mild edema to dorsum of foot. Skin = Heel ulcer wrapped, healing surgical site is wrapped by right knee  Lab Results Recent Labs    06/29/17 0638 06/30/17 0522  WBC 10.4 10.6*  HGB 8.4* 8.4*  HCT 29.5* 29.3*  NA 140 138  K 3.4* 3.3*  CL 103 103  CO2 30 27  BUN 24* 25*  CREATININE 1.49* 1.52*    Microbiology: NGTD Studies/Results: No results  found.   Assessment/Plan: Cellulitis of right foot = finish out course of treatment with 7 days of cephalexin 500mg  TID  Pressure wound and surgical wound = continue with wound care recs  ckd 3 = abtx adjust for renal dosing  Charlotte [name], [facility] Infectious Diseases Cell: [phone] Pager: [phone]  06/30/2017, 4:21 PM

## 2017-06-30 NOTE — Discharge Summary (Addendum)
Physician Discharge Summary  [name]:[id number] DOB: 1941-03-06 DOA: 06/27/2017  PCP: [name], MD  Admit date: 06/27/2017 Discharge date: 06/30/2017  Admitted From: Home Disposition:  Home   Recommendations for Outpatient Follow-up:  1. Follow up with PCP in 1-2 weeks 2. Please obtain BMP/CBC in one week 3. Follow up with Dr [name].   Home Health; yes   Discharge Condition: stable.  CODE STATUS: full code.  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 She has been taking a lot of ibuprofen secondary to pain. Pain worse with ambulation, although not worse with pressure. Ibuprofen not currently helping due to pain. Her right leg is also sore, but no real pain. Denies fevers, chills, nausea, vomiting.  Emergency Department Course: . X-ray of right leg without evidence of osteomyelitis. There is evidence of osteomyelitis in the right toes On x-ray. White count normal.    Assessment & Plan:   Principal Problem:   Cellulitis and abscess of right leg Active Problems:   Chronic atrial fibrillation (HCC)   Anticoagulant long-term use   HTN (hypertension)   Hypothyroidism   Peripheral vascular disease (HCC)   Hard of hearing   CHF (congestive heart failure) (HCC)   Chronic respiratory failure with hypoxia (HCC)   Lower extremity arterial insufficiency, severe, right (HCC)   GERD (gastroesophageal reflux disease)   S/P femoral-popliteal bypass surgery   Dry gangrene (HCC)   Hypokalemia   Osteomyelitis of right foot  ([place])   1-Cellulitis right leg;  -MRI; No evidence of osteomyelitis is seen but 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 Negative for abscess or septic joint. Skin wound adjacent to the head of the fifth metatarsal. Marrow edema in the the periphery of the head of fifth metatarsal could be due to osteomyelitis. Assessment for osteomyelitis is markedly limited as the patient has medullary infarcts in all imaged bones. -treated with IV vancomycin and Zosyn.  -one of two blood culture positive for staph, not aureus.  -ID consulted. appreciate Dr Baxter Flattery, recommendation, treatment for cellulitis, MRI with demineralization that correspond with dry gangrene.  Discharge on keflex for 7 days.  blood culture MRSE likely contaminant.  I have provided few days of opioids.   2-Dry gangrene; PVD, S/P femoral popliteal bypass;  Vascular following. Ok to discharge home   3-Chronic A fib;  continue with coumadin.  Continue with Cardizem.   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   5-HTN; continue with Cardizem, lasix metolazone.   CKD stage III; prior cr; 1.4---1.7 Stable.   Hypothyroidism; continue with synthroid;  Chronic diastolic HF; Continue with metolazone, lasix every other day. Compensated.   Chronic Respiratory Failure;  On chronic 4 L of oxygen.  Stable.   GERD;        Discharge Diagnoses:  Principal Problem:   Cellulitis and abscess of right leg Active Problems:   Chronic atrial fibrillation (HCC)   Anticoagulant long-term use   HTN (hypertension)   Hypothyroidism   Peripheral vascular disease (HCC)   Hard of hearing   CHF (congestive heart failure) (HCC)   Chronic respiratory failure with hypoxia ([place])  Lower extremity arterial insufficiency, severe, right (HCC)   GERD (gastroesophageal reflux disease)   S/P femoral-popliteal bypass surgery   Dry gangrene (HCC)    Hypokalemia   Osteomyelitis of right foot [facility])    Discharge Instructions  Discharge Instructions    Diet - low sodium heart healthy   Complete by:  As directed    Increase activity slowly   Complete by:  As directed      Allergies as of 06/30/2017   No Known Allergies     Medication List    STOP taking these medications   ibuprofen 200 MG tablet Commonly known as:  ADVIL,MOTRIN   vancomycin 500 mg in sodium chloride 0.9 % 100 mL     TAKE these medications   acetaminophen 325 MG tablet Commonly known as:  TYLENOL Take 2 tablets (650 mg total) by mouth every 6 (six) hours as needed for mild pain or fever.   aspirin 81 MG EC tablet Take 1 tablet (81 mg total) by mouth daily.   cephALEXin 500 MG capsule Commonly known as:  KEFLEX Take 1 capsule (500 mg total) by mouth every 8 (eight) hours.   diltiazem 240 MG 24 hr capsule Commonly known as:  CARDIZEM CD Take 240 mg by mouth daily.   furosemide 40 MG tablet Commonly known as:  LASIX Take 1 tablet (40 mg total) by mouth every other day. What changed:  how much to take   gabapentin 100 MG capsule Commonly known as:  NEURONTIN Take 100 mg by mouth 3 (three) times daily.   hydroxyurea 500 MG capsule Commonly known as:  HYDREA Take 2 capsules (1,000 mg total) by mouth daily. May take with food to minimize GI side effects.   levothyroxine 75 MCG tablet Commonly known as:  SYNTHROID, LEVOTHROID Take 1 tablet (75 mcg total) by mouth daily before breakfast.   metolazone 2.5 MG tablet Commonly known as:  ZAROXOLYN Take 2.5 mg by mouth every Monday, Wednesday, and Friday.   oxyCODONE 5 MG immediate release tablet Commonly known as:  Oxy IR/ROXICODONE Take 1 tablet (5 mg total) by mouth every 6 (six) hours as needed for moderate pain.   pravastatin 10 MG tablet Commonly known as:  PRAVACHOL Take 1 tablet (10 mg total) by mouth daily. What changed:  when to take this   warfarin 2.5 MG tablet Commonly known  as:  COUMADIN Take as directed. If you are unsure how to take this medication, talk to your nurse or doctor. Original instructions:  Take 1 tablet (2.5 mg total) by mouth daily at 6 PM. Take one tablet all  days except take  5 mg on Monday. What changed:  additional instructions       No Known Allergies  Consultations:  Vascular.   ID   Procedures/Studies: Dg Tibia/fibula Right  Result Date: 06/27/2017 CLINICAL DATA:  Pain and soft tissue infection. EXAM: RIGHT TIBIA AND FIBULA - 2 VIEW COMPARISON:  None. FINDINGS: Skin/soft tissue defects of the upper medial aspect of the lower leg. No tracking soft tissue air. Surgical clips but no radiopaque foreign body. Diffuse soft tissue edema of the lower extremity. No acute osseous abnormality or bony destructive change. IMPRESSION: Skin/soft tissue defects of the upper medial leg may reflect ulcers. No tracking soft tissue air, radiopaque foreign body, or underlying osseous abnormality. Electronically Signed   By: Jeb Levering M.D.   On: 06/27/2017 19:15   Mr Foot Right Wo Contrast  Result Date: 06/28/2017 CLINICAL DATA:  Right  forefoot pain and swelling. The patient's right fourth toe is black and she has an open wound on the heel. The patient reports right foot pain for several weeks which has worsened over the past 2 weeks. History of peripheral artery disease. EXAM: MRI OF THE RIGHT FOREFOOT WITHOUT CONTRAST TECHNIQUE: Multiplanar, multisequence MR imaging of the right forefoot. Was performed. No intravenous contrast was administered. COMPARISON:  Plain films of the right foot 06/27/2017. FINDINGS: Bones/Joint/Cartilage Medullary infarcts are present in all imaged bones. There is marrow edema in the periphery of the head of the fifth metatarsal subjacent to a skin wound. Ligaments Intact. Muscles and Tendons Diffusely atrophic. No intramuscular fluid collection. No muscle or tendon tear. Soft tissues Subcutaneous edema is present over the  dorsum of the foot. No abscess. IMPRESSION: Skin wound adjacent to the head of the fifth metatarsal. Marrow edema in the the periphery of the head of fifth metatarsal could be due to osteomyelitis. Assessment for osteomyelitis is markedly limited as the patient has medullary infarcts in all imaged bones. No focal abnormality in the fourth toe with medullary infarcts throughout. Negative for soft tissue abscess or septic joint. Electronically Signed   By: Inge Rise M.D.   On: 06/28/2017 11:50   Mr Ankle Right Wo Contrast  Result Date: 06/28/2017 CLINICAL DATA:  Right forefoot pain and swelling. The patient's right fourth toe is black and she has an open wound on the heel. The patient reports right foot pain for several weeks which has worsened over the past 2 weeks. History of peripheral artery disease. EXAM: MRI OF THE RIGHT ANKLE WITHOUT CONTRAST TECHNIQUE: Multiplanar, multisequence MR imaging of the ankle was performed. No intravenous contrast was administered. COMPARISON:  None. FINDINGS: TENDONS Peroneal: Intact. Posteromedial: Intact. Anterior: Intact. Achilles: Intact. Plantar Fascia: Intact and normal in appearance. LIGAMENTS Lateral: Intact. Medial: Intact. CARTILAGE Ankle Joint: Negative. Subtalar Joints/Sinus Tarsi: Negative. Bones: Medullary infarcts are seen in all imaged bones. No focal marrow edema to suggest osteomyelitis is identified. Other: The patient's skin wound over the heel is not visualized. No abscess is seen. 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 Negative for abscess or septic joint. Electronically Signed   By: Inge Rise M.D.   On: 06/28/2017 11:54   Dg Chest Port 1 View  Result Date: 06/09/2017 CLINICAL DATA:  PICC line placement EXAM: PORTABLE CHEST 1 VIEW COMPARISON:  05/28/2017 FINDINGS: Left upper extremity catheter tip overlies the SVC. Small right and small moderate left pleural effusions, likely  layering and causing hazy opacity of the lower lung zones. Bibasilar consolidations. Cardiomegaly with vascular congestion and mild pulmonary edema. IMPRESSION: 1. Left upper extremity catheter tip overlies the SVC 2. Increasing bilateral likely layering pleural effusions with bibasilar atelectasis or pneumonia 3. Cardiomegaly with vascular congestion and mild pulmonary edema Electronically Signed   By: Donavan Foil M.D.   On: 06/09/2017 23:46   Dg Foot Complete Right  Result Date: 06/27/2017 CLINICAL DATA:  Pain, soft tissue infection.  Necrotic toes. EXAM: RIGHT FOOT COMPLETE - 3+ VIEW COMPARISON:  None. FINDINGS: Atrophic soft tissues of the distal fourth digit with question of osseous demineralization of the distal phalanx. No tracking soft tissue air. Fifth digit distal phalanx is truncated in a chronic appearance. Soft tissue irregularity noted about the lateral foot adjacent to the distal fifth metatarsal. Degenerative change of the first ray. No fracture or dislocation. Vascular calcifications. Dorsal soft tissue edema. IMPRESSION: Atrophic soft tissues of the distal  fourth digit with question of distal phalanx demineralization, possible osteomyelitis. Skin irregularity of the lateral foot about the distal fifth metatarsal without underlying bony changes. Electronically Signed   By: Jeb Levering M.D.   On: 06/27/2017 19:14     Subjective: Feeling ok , pain controlled.   Discharge Exam: Vitals:   06/29/17 2137 06/30/17 0547  BP: (!) 118/56 126/78  Pulse: 85 90  Resp: 20 18  Temp: 97.8 F (36.6 C) (!) 97.5 F (36.4 C)  SpO2: 90% 90%   Vitals:   06/29/17 0756 06/29/17 1300 06/29/17 2137 06/30/17 0547  BP: 115/60 (!) 96/49 (!) 118/56 126/78  Pulse: 84 73 85 90  Resp:  20 20 18   Temp:  98.1 F (36.7 C) 97.8 F (36.6 C) (!) 97.5 F (36.4 C)  TempSrc:  Oral Oral Oral  SpO2: 93% 99% 90% 90%  Weight:      Height:        General: Pt is alert, awake, not in acute  distress Cardiovascular: RRR, S1/S2 +, no rubs, no gallops Respiratory: CTA bilaterally, no wheezing, no rhonchi Abdominal: Soft, NT, ND, bowel sounds + Extremities:  right foot with less redness, gangrenosus 4 metatarsal, 2 open wound  along incision line, right heel pressure injury.        The results of significant diagnostics from this hospitalization (including imaging, microbiology, ancillary and laboratory) are listed below for reference.     Microbiology: Recent Results (from the past 240 hour(s))  Culture, blood (Routine X 2) w Reflex to ID Panel     Status: None (Preliminary result)   Collection Time: 06/27/17  8:23 PM  Result Value Ref Range Status   Specimen Description LEFT ANTECUBITAL  Final   Special Requests   Final    BOTTLES DRAWN AEROBIC AND ANAEROBIC Blood Culture adequate volume   Culture NO GROWTH 2 DAYS  Final   Report Status PENDING  Incomplete  Culture, blood (Routine X 2) w Reflex to ID Panel     Status: None (Preliminary result)   Collection Time: 06/27/17  8:28 PM  Result Value Ref Range Status   Specimen Description BLOOD RIGHT ARM  Final   Special Requests   Final    BOTTLES DRAWN AEROBIC ONLY Blood Culture adequate volume   Culture  Setup Time   Final    GRAM POSITIVE COCCI Gram Stain Report Called to,Read Back By and Verified With: NURSE TULIAO,C. NOTIFIED AT 0026 ON 06/29/2017 BY EVA AEROBIC BOTTLE ONLY Performed at [facility] BY AND VERIFIED WITH: Ailene Rud AT 7510 06/29/17 BY L BENFIELD Performed at [facility], [place] [address], [place], [place] [address]    Culture GRAM POSITIVE COCCI  Final   Report Status PENDING  Incomplete  Blood Culture ID Panel (Reflexed)     Status: Abnormal   Collection Time: 06/27/17  8:28 PM  Result Value Ref Range Status   Enterococcus species NOT DETECTED NOT DETECTED Final   Listeria monocytogenes NOT DETECTED NOT DETECTED Final   Staphylococcus  species DETECTED (A) NOT DETECTED Final    Comment: Methicillin (oxacillin) resistant coagulase negative staphylococcus. Possible blood culture contaminant (unless isolated from more than one blood culture draw or clinical case suggests pathogenicity). No antibiotic treatment is indicated for blood  culture contaminants. CRITICAL RESULT CALLED TO, READ BACK BY AND VERIFIED WITH: M MACCIA,PHARMD AT 7782 06/29/17 BY L BENFIELD    Staphylococcus aureus NOT DETECTED NOT DETECTED Final  Methicillin resistance DETECTED (A) NOT DETECTED Final    Comment: CRITICAL RESULT CALLED TO, READ BACK BY AND VERIFIED WITH: Ailene Rud AT 2094 06/29/17 BY L BENFIELD    Streptococcus species NOT DETECTED NOT DETECTED Final   Streptococcus agalactiae NOT DETECTED NOT DETECTED Final   Streptococcus pneumoniae NOT DETECTED NOT DETECTED Final   Streptococcus pyogenes NOT DETECTED NOT DETECTED Final   Acinetobacter baumannii NOT DETECTED NOT DETECTED Final   Enterobacteriaceae species NOT DETECTED NOT DETECTED Final   Enterobacter cloacae complex NOT DETECTED NOT DETECTED Final   Escherichia coli NOT DETECTED NOT DETECTED Final   Klebsiella oxytoca NOT DETECTED NOT DETECTED Final   Klebsiella pneumoniae NOT DETECTED NOT DETECTED Final   Proteus species NOT DETECTED NOT DETECTED Final   Serratia marcescens NOT DETECTED NOT DETECTED Final   Haemophilus influenzae NOT DETECTED NOT DETECTED Final   Neisseria meningitidis NOT DETECTED NOT DETECTED Final   Pseudomonas aeruginosa NOT DETECTED NOT DETECTED Final   Candida albicans NOT DETECTED NOT DETECTED Final   Candida glabrata NOT DETECTED NOT DETECTED Final   Candida krusei NOT DETECTED NOT DETECTED Final   Candida parapsilosis NOT DETECTED NOT DETECTED Final   Candida tropicalis NOT DETECTED NOT DETECTED Final    Comment: Performed at [facility], [place]. [address] [place], [place] [address]  MRSA PCR Screening     Status: None   Collection Time:  06/28/17 11:18 AM  Result Value Ref Range Status   MRSA by PCR NEGATIVE NEGATIVE Final    Comment:        The GeneXpert MRSA Assay (FDA approved for NASAL specimens only), is one component of a comprehensive MRSA colonization surveillance program. It is not intended to diagnose MRSA infection nor to guide or monitor treatment for MRSA infections.      Labs: BNP (last 3 results) Recent Labs    02/12/17 1505 03/11/17 2115 05/28/17 1616  BNP 608.0* 1,374.8* 836.6*   Basic Metabolic Panel: Recent Labs  Lab 06/27/17 1742 06/28/17 0715 06/29/17 0638 06/30/17 0522  NA 142 139 140 138  K 3.3* 3.4* 3.4* 3.3*  CL 103 102 103 103  CO2 30 28 30 27   GLUCOSE 89 78 79 78  BUN 33* 27* 24* 25*  CREATININE 1.37* 1.30* 1.49* 1.52*  CALCIUM 8.6* 8.3* 8.3* 8.3*   Liver Function Tests: No results for input(s): AST, ALT, ALKPHOS, BILITOT, PROT, ALBUMIN in the last 168 hours. No results for input(s): LIPASE, AMYLASE in the last 168 hours. No results for input(s): AMMONIA in the last 168 hours. CBC: Recent Labs  Lab 06/27/17 1742 06/28/17 0715 06/29/17 2947 06/30/17 0522  WBC 12.6* 11.6* 10.4 10.6*  NEUTROABS 11.0*  --   --   --   HGB 9.1* 8.5* 8.4* 8.4*  HCT 31.8* 29.1* 29.5* 29.3*  MCV 90.6 87.7 89.1 88.3  PLT 497* 440* 496* 534*   Cardiac Enzymes: No results for input(s): CKTOTAL, CKMB, CKMBINDEX, TROPONINI in the last 168 hours. BNP: Invalid input(s): POCBNP CBG: No results for input(s): GLUCAP in the last 168 hours. D-Dimer No results for input(s): DDIMER in the last 72 hours. Hgb A1c No results for input(s): HGBA1C in the last 72 hours. Lipid Profile No results for input(s): CHOL, HDL, LDLCALC, TRIG, CHOLHDL, LDLDIRECT in the last 72 hours. Thyroid function studies No results for input(s): TSH, T4TOTAL, T3FREE, THYROIDAB in the last 72 hours.  Invalid input(s): FREET3 Anemia work up No results for input(s): VITAMINB12, FOLATE, FERRITIN, TIBC, IRON, RETICCTPCT  in the last 72 hours. Urinalysis    Component Value Date/Time   COLORURINE YELLOW 03/09/2017 1648   APPEARANCEUR CLEAR 03/09/2017 1648   LABSPEC 1.021 03/09/2017 1648   PHURINE 5.0 03/09/2017 1648   GLUCOSEU NEGATIVE 03/09/2017 1648   HGBUR MODERATE (A) 03/09/2017 1648   BILIRUBINUR NEGATIVE 03/09/2017 1648   KETONESUR NEGATIVE 03/09/2017 1648   PROTEINUR NEGATIVE 03/09/2017 1648   UROBILINOGEN 1.0 09/14/2012 2337   NITRITE NEGATIVE 03/09/2017 1648   LEUKOCYTESUR NEGATIVE 03/09/2017 1648   Sepsis Labs Invalid input(s): PROCALCITONIN,  WBC,  LACTICIDVEN Microbiology Recent Results (from the past 240 hour(s))  Culture, blood (Routine X 2) w Reflex to ID Panel     Status: None (Preliminary result)   Collection Time: 06/27/17  8:23 PM  Result Value Ref Range Status   Specimen Description LEFT ANTECUBITAL  Final   Special Requests   Final    BOTTLES DRAWN AEROBIC AND ANAEROBIC Blood Culture adequate volume   Culture NO GROWTH 2 DAYS  Final   Report Status PENDING  Incomplete  Culture, blood (Routine X 2) w Reflex to ID Panel     Status: None (Preliminary result)   Collection Time: 06/27/17  8:28 PM  Result Value Ref Range Status   Specimen Description BLOOD RIGHT ARM  Final   Special Requests   Final    BOTTLES DRAWN AEROBIC ONLY Blood Culture adequate volume   Culture  Setup Time   Final    GRAM POSITIVE COCCI Gram Stain Report Called to,Read Back By and Verified With: NURSE TULIAO,C. NOTIFIED AT 0026 ON 06/29/2017 BY EVA AEROBIC BOTTLE ONLY Performed at [facility] BY AND VERIFIED WITH: Ailene Rud AT 8921 06/29/17 BY L BENFIELD Performed at [facility], [place] [address], [place], [place] [address]    Culture GRAM POSITIVE COCCI  Final   Report Status PENDING  Incomplete  Blood Culture ID Panel (Reflexed)     Status: Abnormal   Collection Time: 06/27/17  8:28 PM  Result Value Ref Range Status   Enterococcus species NOT  DETECTED NOT DETECTED Final   Listeria monocytogenes NOT DETECTED NOT DETECTED Final   Staphylococcus species DETECTED (A) NOT DETECTED Final    Comment: Methicillin (oxacillin) resistant coagulase negative staphylococcus. Possible blood culture contaminant (unless isolated from more than one blood culture draw or clinical case suggests pathogenicity). No antibiotic treatment is indicated for blood  culture contaminants. CRITICAL RESULT CALLED TO, READ BACK BY AND VERIFIED WITH: M MACCIA,PHARMD AT 4081 06/29/17 BY L BENFIELD    Staphylococcus aureus NOT DETECTED NOT DETECTED Final   Methicillin resistance DETECTED (A) NOT DETECTED Final    Comment: CRITICAL RESULT CALLED TO, READ BACK BY AND VERIFIED WITH: Ailene Rud AT 4481 06/29/17 BY L BENFIELD    Streptococcus species NOT DETECTED NOT DETECTED Final   Streptococcus agalactiae NOT DETECTED NOT DETECTED Final   Streptococcus pneumoniae NOT DETECTED NOT DETECTED Final   Streptococcus pyogenes NOT DETECTED NOT DETECTED Final   Acinetobacter baumannii NOT DETECTED NOT DETECTED Final   Enterobacteriaceae species NOT DETECTED NOT DETECTED Final   Enterobacter cloacae complex NOT DETECTED NOT DETECTED Final   Escherichia coli NOT DETECTED NOT DETECTED Final   Klebsiella oxytoca NOT DETECTED NOT DETECTED Final   Klebsiella pneumoniae NOT DETECTED NOT DETECTED Final   Proteus species NOT DETECTED NOT DETECTED Final   Serratia marcescens NOT DETECTED NOT DETECTED Final   Haemophilus influenzae NOT DETECTED NOT DETECTED Final  Neisseria meningitidis NOT DETECTED NOT DETECTED Final   Pseudomonas aeruginosa NOT DETECTED NOT DETECTED Final   Candida albicans NOT DETECTED NOT DETECTED Final   Candida glabrata NOT DETECTED NOT DETECTED Final   Candida krusei NOT DETECTED NOT DETECTED Final   Candida parapsilosis NOT DETECTED NOT DETECTED Final   Candida tropicalis NOT DETECTED NOT DETECTED Final    Comment: Performed at [facility], [place] [address], [place], [place] [address]  MRSA PCR Screening     Status: None   Collection Time: 06/28/17 11:18 AM  Result Value Ref Range Status   MRSA by PCR NEGATIVE NEGATIVE Final    Comment:        The GeneXpert MRSA Assay (FDA approved for NASAL specimens only), is one component of a comprehensive MRSA colonization surveillance program. It is not intended to diagnose MRSA infection nor to guide or monitor treatment for MRSA infections.      Time coordinating discharge: Over 30 minutes  SIGNED:   Elmarie Shiley, MD  [facility] 06/30/2017, 12:07 PM Pager   If 7PM-7AM, please contact night-coverage [email or web address] Password [email or web address]

## 2017-06-30 NOTE — Care Management Important Message (Signed)
Important Message  Patient Details  Name: ALAISA MOFFITT MRN: [id number] Date of Birth: 1940-10-03   Medicare Important Message Given:  Yes    Orbie Pyo 06/30/2017, 2:05 PM

## 2017-06-30 NOTE — Care Management Note (Signed)
Case Management Note  Patient Details  Name: Charlotte [name] MRN: [id number] Date of Birth: [birth year]  Subjective/Objective:                    Action/Plan:  Husband requested ambulance transport home.   Spoke to patient at bedside . She is from home with [facility] and has home oxygen through [facility]. Confirmed she needs ambulance transport home. Called patient's husband [name] , patient's son answered . Son aware discharge is today confirmed he is at home waiting on her arrival, confirmed address and patient has home oxygen at home already and home health is [facility].   Spoke to [place] at [facility] , [place] aware discharge today , new orders faxed [phone].   Paged MD to sign gold DNR form. Expected Discharge Date:  06/30/17               Expected Discharge Plan:  [place]  In-House Referral:     Discharge planning Services  CM Consult  Post Acute Care Choice:  Home Health Choice offered to:  Patient, Adult Children  DME Arranged:    DME Agency:     HH Arranged:  RN, PT, OT HH Agency:  [facility]  Status of Service:  Completed, signed off  If discussed at [place] of Stay Meetings, dates discussed:    Additional Comments:  Marilu Favre, RN 06/30/2017, 12:32 PM

## 2017-06-30 NOTE — Progress Notes (Signed)
Discharge Home. [facility] transporting patient. Husband aware of the discharge. Home discharge instruction given to patient, no question verbalized. HH needs arranged by case Freight forwarder.

## 2017-06-30 NOTE — Progress Notes (Signed)
ANTICOAGULATION CONSULT NOTE - Follow Up Consult  Pharmacy Consult for Warfarin Indication: atrial fibrillation  No Known Allergies  Patient Measurements: Height: 6' (182.9 cm) Weight: 159 lb (72.1 kg) IBW/kg (Calculated) : 73.1  Vital Signs: Temp: 97.5 F (36.4 C) (11/27 0547) Temp Source: Oral (11/27 0547) BP: 126/78 (11/27 0547) Pulse Rate: 90 (11/27 0547)  Labs: Recent Labs    06/28/17 0715 06/29/17 3151 06/30/17 0522  HGB 8.5* 8.4* 8.4*  HCT 29.1* 29.5* 29.3*  PLT 440* 496* 534*  LABPROT 27.3* 28.8* 26.3*  INR 2.56 2.74 2.44  CREATININE 1.30* 1.49* 1.52*    Estimated Creatinine Clearance: 37 mL/min (A) (by C-G formula based on SCr of 1.52 mg/dL (H)).   Assessment: 76 year old female on warfarin prior to admission for Afib INR therapeutic on admission. Dose PTA = 5 mg on Mondays, 2.5 mg other days Last dose 11/23 pta.  Today, INR remains therapeutic. CBC stable. No bleed documented.  Goal of Therapy:  INR 2-3 Monitor platelets by anticoagulation protocol: Yes   Plan:  Warfarin 5 mg po x 1 - likely can resume home regimen Daily INR Monitor CBC, s/sx bleeding  Elicia Lamp, PharmD, BCPS Clinical Pharmacist Rx Phone # for today: [phone] After 3:30PM, please call Main Rx: #[id number] 06/30/2017 11:13 AM

## 2017-07-01 ENCOUNTER — Telehealth: Payer: Self-pay | Admitting: *Deleted

## 2017-07-01 ENCOUNTER — Ambulatory Visit (INDEPENDENT_AMBULATORY_CARE_PROVIDER_SITE_OTHER): Payer: Medicare Other | Admitting: *Deleted

## 2017-07-01 DIAGNOSIS — T8140XD Infection following a procedure, unspecified, subsequent encounter: Secondary | ICD-10-CM | POA: Diagnosis not present

## 2017-07-01 DIAGNOSIS — L89890 Pressure ulcer of other site, unstageable: Secondary | ICD-10-CM | POA: Diagnosis not present

## 2017-07-01 DIAGNOSIS — Z5181 Encounter for therapeutic drug level monitoring: Secondary | ICD-10-CM | POA: Diagnosis not present

## 2017-07-01 DIAGNOSIS — B9689 Other specified bacterial agents as the cause of diseases classified elsewhere: Secondary | ICD-10-CM | POA: Diagnosis not present

## 2017-07-01 DIAGNOSIS — L03115 Cellulitis of right lower limb: Secondary | ICD-10-CM | POA: Diagnosis not present

## 2017-07-01 DIAGNOSIS — I110 Hypertensive heart disease with heart failure: Secondary | ICD-10-CM | POA: Diagnosis not present

## 2017-07-01 DIAGNOSIS — I5042 Chronic combined systolic (congestive) and diastolic (congestive) heart failure: Secondary | ICD-10-CM | POA: Diagnosis not present

## 2017-07-01 DIAGNOSIS — I482 Chronic atrial fibrillation, unspecified: Secondary | ICD-10-CM

## 2017-07-01 LAB — CULTURE, BLOOD (ROUTINE X 2): SPECIAL REQUESTS: ADEQUATE

## 2017-07-01 LAB — POCT INR: INR: 2.4

## 2017-07-01 NOTE — Telephone Encounter (Signed)
Done.  See coumadin note. 

## 2017-07-01 NOTE — Telephone Encounter (Signed)
INR 2.4 per Lenna Sciara w/ [facility] --can call pt at home

## 2017-07-02 DIAGNOSIS — Z6821 Body mass index (BMI) 21.0-21.9, adult: Secondary | ICD-10-CM | POA: Diagnosis not present

## 2017-07-02 DIAGNOSIS — L03119 Cellulitis of unspecified part of limb: Secondary | ICD-10-CM | POA: Diagnosis not present

## 2017-07-02 DIAGNOSIS — I10 Essential (primary) hypertension: Secondary | ICD-10-CM | POA: Diagnosis not present

## 2017-07-02 DIAGNOSIS — E782 Mixed hyperlipidemia: Secondary | ICD-10-CM | POA: Diagnosis not present

## 2017-07-02 DIAGNOSIS — D649 Anemia, unspecified: Secondary | ICD-10-CM | POA: Diagnosis not present

## 2017-07-02 DIAGNOSIS — D45 Polycythemia vera: Secondary | ICD-10-CM | POA: Diagnosis not present

## 2017-07-02 DIAGNOSIS — I5030 Unspecified diastolic (congestive) heart failure: Secondary | ICD-10-CM | POA: Diagnosis not present

## 2017-07-02 DIAGNOSIS — I4891 Unspecified atrial fibrillation: Secondary | ICD-10-CM | POA: Diagnosis not present

## 2017-07-02 DIAGNOSIS — Z1389 Encounter for screening for other disorder: Secondary | ICD-10-CM | POA: Diagnosis not present

## 2017-07-02 DIAGNOSIS — E876 Hypokalemia: Secondary | ICD-10-CM | POA: Diagnosis not present

## 2017-07-03 ENCOUNTER — Encounter (HOSPITAL_COMMUNITY): Payer: Self-pay | Admitting: Emergency Medicine

## 2017-07-03 ENCOUNTER — Emergency Department (HOSPITAL_COMMUNITY): Payer: Medicare Other

## 2017-07-03 ENCOUNTER — Emergency Department (HOSPITAL_COMMUNITY)
Admission: EM | Admit: 2017-07-03 | Discharge: 2017-07-03 | Disposition: A | Discharge status: Home / Self Care | Payer: Medicare Other | Attending: Emergency Medicine | Admitting: Emergency Medicine

## 2017-07-03 ENCOUNTER — Other Ambulatory Visit: Payer: Self-pay

## 2017-07-03 DIAGNOSIS — R0609 Other forms of dyspnea: Secondary | ICD-10-CM | POA: Diagnosis not present

## 2017-07-03 DIAGNOSIS — R0902 Hypoxemia: Secondary | ICD-10-CM | POA: Diagnosis not present

## 2017-07-03 DIAGNOSIS — E785 Hyperlipidemia, unspecified: Secondary | ICD-10-CM | POA: Insufficient documentation

## 2017-07-03 DIAGNOSIS — Z7901 Long term (current) use of anticoagulants: Secondary | ICD-10-CM | POA: Diagnosis not present

## 2017-07-03 DIAGNOSIS — Z7982 Long term (current) use of aspirin: Secondary | ICD-10-CM | POA: Insufficient documentation

## 2017-07-03 DIAGNOSIS — I5042 Chronic combined systolic (congestive) and diastolic (congestive) heart failure: Secondary | ICD-10-CM | POA: Insufficient documentation

## 2017-07-03 DIAGNOSIS — Z86718 Personal history of other venous thrombosis and embolism: Secondary | ICD-10-CM | POA: Insufficient documentation

## 2017-07-03 DIAGNOSIS — N183 Chronic kidney disease, stage 3 (moderate): Secondary | ICD-10-CM | POA: Diagnosis not present

## 2017-07-03 DIAGNOSIS — I4891 Unspecified atrial fibrillation: Secondary | ICD-10-CM | POA: Insufficient documentation

## 2017-07-03 DIAGNOSIS — R0600 Dyspnea, unspecified: Secondary | ICD-10-CM | POA: Diagnosis not present

## 2017-07-03 DIAGNOSIS — J449 Chronic obstructive pulmonary disease, unspecified: Secondary | ICD-10-CM | POA: Insufficient documentation

## 2017-07-03 DIAGNOSIS — J90 Pleural effusion, not elsewhere classified: Secondary | ICD-10-CM | POA: Diagnosis not present

## 2017-07-03 DIAGNOSIS — R112 Nausea with vomiting, unspecified: Secondary | ICD-10-CM | POA: Diagnosis not present

## 2017-07-03 DIAGNOSIS — I130 Hypertensive heart and chronic kidney disease with heart failure and stage 1 through stage 4 chronic kidney disease, or unspecified chronic kidney disease: Secondary | ICD-10-CM | POA: Diagnosis not present

## 2017-07-03 DIAGNOSIS — Z79899 Other long term (current) drug therapy: Secondary | ICD-10-CM | POA: Diagnosis not present

## 2017-07-03 DIAGNOSIS — T50904A Poisoning by unspecified drugs, medicaments and biological substances, undetermined, initial encounter: Secondary | ICD-10-CM | POA: Diagnosis not present

## 2017-07-03 DIAGNOSIS — R279 Unspecified lack of coordination: Secondary | ICD-10-CM | POA: Diagnosis not present

## 2017-07-03 DIAGNOSIS — Z7401 Bed confinement status: Secondary | ICD-10-CM | POA: Diagnosis not present

## 2017-07-03 LAB — CBC
HCT: 30.6 % — ABNORMAL LOW (ref 36.0–46.0)
Hemoglobin: 8.7 g/dL — ABNORMAL LOW (ref 12.0–15.0)
MCH: 25.7 pg — ABNORMAL LOW (ref 26.0–34.0)
MCHC: 28.4 g/dL — ABNORMAL LOW (ref 30.0–36.0)
MCV: 90.3 fL (ref 78.0–100.0)
PLATELETS: 529 10*3/uL — AB (ref 150–400)
RBC: 3.39 MIL/uL — AB (ref 3.87–5.11)
RDW: 25.3 % — ABNORMAL HIGH (ref 11.5–15.5)
WBC: 9.6 10*3/uL (ref 4.0–10.5)

## 2017-07-03 LAB — BLOOD GAS, ARTERIAL
Acid-Base Excess: 4.6 mmol/L — ABNORMAL HIGH (ref 0.0–2.0)
BICARBONATE: 28.5 mmol/L — AB (ref 20.0–28.0)
Drawn by: 274071
O2 CONTENT: 4 L/min
O2 Saturation: 89.5 %
PCO2 ART: 38.5 mmHg (ref 32.0–48.0)
PO2 ART: 56.6 mmHg — AB (ref 83.0–108.0)
pH, Arterial: 7.478 — ABNORMAL HIGH (ref 7.350–7.450)

## 2017-07-03 LAB — COMPREHENSIVE METABOLIC PANEL
ALK PHOS: 90 U/L (ref 38–126)
ALT: 9 U/L — AB (ref 14–54)
AST: 15 U/L (ref 15–41)
Albumin: 2.5 g/dL — ABNORMAL LOW (ref 3.5–5.0)
Anion gap: 9 (ref 5–15)
BILIRUBIN TOTAL: 0.8 mg/dL (ref 0.3–1.2)
BUN: 20 mg/dL (ref 6–20)
CALCIUM: 8.6 mg/dL — AB (ref 8.9–10.3)
CO2: 30 mmol/L (ref 22–32)
CREATININE: 1.04 mg/dL — AB (ref 0.44–1.00)
Chloride: 98 mmol/L — ABNORMAL LOW (ref 101–111)
GFR, EST AFRICAN AMERICAN: 60 mL/min — AB (ref >60)
GFR, EST NON AFRICAN AMERICAN: 52 mL/min — AB (ref >60)
Glucose, Bld: 95 mg/dL (ref 65–99)
Potassium: 3.2 mmol/L — ABNORMAL LOW (ref 3.5–5.1)
Sodium: 137 mmol/L (ref 135–145)
TOTAL PROTEIN: 5.5 g/dL — AB (ref 6.5–8.1)

## 2017-07-03 LAB — URINALYSIS, ROUTINE W REFLEX MICROSCOPIC
Bilirubin Urine: NEGATIVE
Glucose, UA: NEGATIVE mg/dL
HGB URINE DIPSTICK: NEGATIVE
KETONES UR: NEGATIVE mg/dL
LEUKOCYTES UA: NEGATIVE
Nitrite: NEGATIVE
PROTEIN: NEGATIVE mg/dL
Specific Gravity, Urine: 1.01 (ref 1.005–1.030)
pH: 7 (ref 5.0–8.0)

## 2017-07-03 LAB — CULTURE, BLOOD (ROUTINE X 2)
CULTURE: NO GROWTH
SPECIAL REQUESTS: ADEQUATE

## 2017-07-03 LAB — LIPASE, BLOOD: LIPASE: 25 U/L (ref 11–51)

## 2017-07-03 MED ORDER — SODIUM CHLORIDE 0.9 % IV BOLUS (SEPSIS)
500.0000 mL | Freq: Once | INTRAVENOUS | Status: AC
  Administered 2017-07-03: 500 mL via INTRAVENOUS

## 2017-07-03 MED ORDER — ONDANSETRON 4 MG PO TBDP: ORAL_TABLET | ORAL | 0 refills | Status: DC

## 2017-07-03 MED ORDER — ONDANSETRON HCL 4 MG/2ML IJ SOLN: 4.0000 mg | Freq: Once | INTRAMUSCULAR | Status: DC

## 2017-07-03 MED ORDER — ONDANSETRON HCL 4 MG/2ML IJ SOLN
4.0000 mg | Freq: Once | INTRAMUSCULAR | Status: AC | PRN
  Administered 2017-07-03: 4 mg via INTRAVENOUS
  Filled 2017-07-03: qty 2

## 2017-07-03 MED ORDER — CEFAZOLIN SODIUM-DEXTROSE 1-4 GM/50ML-% IV SOLN
1.0000 g | Freq: Once | INTRAVENOUS | Status: AC
  Administered 2017-07-03: 1 g via INTRAVENOUS
  Filled 2017-07-03: qty 50

## 2017-07-03 NOTE — ED Triage Notes (Signed)
Patient in via EMS from home. Alert and oriented. Airway patent. No acute distress noted. Patient c/o nausea and vomiting that started after taking depression medication recently prescribe to her. Per patient took first dose last night. (Unsure of name of medication, husband is bringing it to hospital.) Denies taking anything for nausea.

## 2017-07-03 NOTE — ED Notes (Signed)
[facility] has been notified by Network engineer that pt needs transport home for discharge.

## 2017-07-03 NOTE — ED Notes (Signed)
EDP notified that pt's O2 sat drops to 83-85% on 4L O2 via [place] while sleeping. Pt normally wears 4L O2 via [place] at home. When pt is aroused, O2 sat increases to 88-89%. No distress noted. Denies cough. EDP will order CXR.

## 2017-07-03 NOTE — Discharge Instructions (Signed)
Use your home oxygen as previously directed. You can increase it to 5 L if needed however if you persistently need more than that you need to come to the emergency department or see her physician. Finish your antibiotics as previously prescribed.  If you were given medicines take as directed.  If you are on coumadin or contraceptives realize their levels and effectiveness is altered by many different medicines.  If you have any reaction (rash, tongues swelling, other) to the medicines stop taking and see a physician.    If your blood pressure was elevated in the ER make sure you follow up for management with a primary doctor or return for chest pain, shortness of breath or stroke symptoms.  Please follow up as directed and return to the ER or see a physician for new or worsening symptoms.  Thank you. Vitals:   07/03/17 1300 07/03/17 1330 07/03/17 1400 07/03/17 1430  BP: 125/68 127/78 107/67 119/68  Pulse: 83 90 92 86  Resp: 14 20 (!) 22 15  Temp:      TempSrc:      SpO2: 90% (!) 84% (!) 84% (!) 89%  Weight:      Height:

## 2017-07-03 NOTE — ED Notes (Signed)
Pt tolerating water without difficulty. Pt still says she feels nauseated, but reports drinking doesn't make her nausea any worse.

## 2017-07-03 NOTE — ED Provider Notes (Addendum)
[facility] EMERGENCY DEPARTMENT Provider Note   CSN: [id number] Arrival date & time: 07/03/17  8850     History   Chief Complaint Chief Complaint  Patient presents with  . Emesis    HPI Charlotte [name] is a 76 y.o. female.  Patient presents with worsening nausea and vomiting start yesterday evening after taking first dose of prescribed antidepression medication. Patient also took along with her Keflex for her recent admission for cellulitis and took her narcotic. Patient was vomiting this morning. Patient feels generally weak and decreased appetite. No fevers or chills. Patient has had bypass surgery in the right leg with wounds healing well and has a chronic ulcer being followed closely with improvement every week. Patient is known gangrene of the right foot. Patient has atrial fibrillation chronic on Coumadin. Known peripheral vascular disease.      Past Medical History:  Diagnosis Date  . Anxiety   . Arthritis   . Atrial fibrillation, chronic (HCC)    a. on Coumadin for anticoagulation.   . Cellulitis 06/29/2017   RIGHT LOWER LEG  . CHF (congestive heart failure) ([place])    combined  . COPD (chronic obstructive pulmonary disease) ([place])    on home O2  . Deaf   . Dry gangrene ([place])    R foot  . DVT (deep venous thrombosis) ([place])   . Essential hypertension   . GERD (gastroesophageal reflux disease)   . Hyperlipidemia   . Hypothyroidism   . Peripheral neuropathy   . Peripheral vascular disease ([place])   . 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 Ulcer of ankle ([place])    Bilateral 2015  . Varicose veins     Patient Active Problem List   Diagnosis Date Noted  . CKD (chronic kidney disease) stage 3, GFR 30-59 ml/min (HCC)   . Foot pain, right   . Cellulitis and abscess of right leg 06/27/2017  . Dry gangrene ([place]) 06/27/2017  . Hypokalemia 06/27/2017  . 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 S/P femoral-popliteal bypass surgery   . Edema of right lower  extremity 06/08/2017  . GERD (gastroesophageal reflux disease) 06/08/2017  . Pressure ulcer 05/29/2017  . Sepsis ([place]) 05/28/2017  . Hypotension 05/28/2017  . Pressure injury of skin 03/14/2017  . Acute renal failure (ARF) ([place])   . Hyperlipidemia 03/05/2017  . Lower extremity arterial insufficiency, severe, right ([place]) 03/04/2017  . Chronic respiratory failure with hypoxia ([place]) 02/12/2017  . Cellulitis 07/03/2016  . Anemia 07/03/2016  . Cough 11/29/2015  . Hypoxemia 11/29/2015  . CHF (congestive heart failure) ([place]) 11/29/2015  . Acute on chronic respiratory failure with hypoxia ([place])   . Pleural effusion on left   . Acute on chronic diastolic (congestive) heart failure ([place])   . Dyspnea 11/22/2014  . Pleural effusion 11/22/2014  . Hard of hearing 11/22/2014  . Thrombocytosis ([place]) 11/22/2014  . Acute respiratory failure with hypoxemia ([place]) 11/22/2014  . Encounter for therapeutic drug monitoring 08/31/2013  . Aftercare following surgery of the circulatory system, [place] 12/21/2012  . Pain in limb 10/19/2012  . Peripheral vascular disease ([place]) 09/24/2012  . Swollen leg 09/24/2012  . Varicose veins of lower extremities with other complications 27/74/1287  . 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 Arterial occlusion due to stenosis ([place]) 08/20/2012  . Wound infection after surgery 01/07/2012  . Compartment syndrome, nontraumatic, lower extremity 11/29/2011  . Pain of right thigh 11/28/2011  . Hip fracture, right ([place]) 11/15/2011  .  Chronic atrial fibrillation ([place]) 11/15/2011  . Anticoagulant long-term use 11/15/2011  . HTN (hypertension) 11/15/2011  . Hypothyroidism 11/15/2011  . Polycythemia vera ([place]) 10/01/2011    Past Surgical History:  Procedure Laterality Date  . ABDOMINAL AORTAGRAM N/A 09/14/2012   Procedure: ABDOMINAL [name];  Surgeon: Serafina Mitchell, MD;  Location: [facility] CATH LAB;  Service: Cardiovascular;  Laterality: N/A;  .  ABDOMINAL AORTOGRAM N/A 03/06/2017   Procedure: ABDOMINAL AORTOGRAM;  Surgeon: Elam Dutch, MD;  Location: [place] CV LAB;  Service: Cardiovascular;  Laterality: N/A;  . ABDOMINAL HYSTERECTOMY    . CORONARY ANGIOPLASTY    . DRESSING CHANGE UNDER ANESTHESIA  12/02/2011   Procedure: DRESSING CHANGE UNDER ANESTHESIA;  Surgeon: Carole Civil, MD;  Location: [facility];  Service: Orthopedics;  Laterality: Right;  . ENDARTERECTOMY FEMORAL Right 09/15/2012   Procedure: ENDARTERECTOMY FEMORAL;  Surgeon: Mal Misty, MD;  Location: [place];  Service: Vascular;  Laterality: Right;  . FASCIOTOMY  11/29/2011   Procedure: FASCIOTOMY;  Surgeon: Carole Civil, MD;  Location: [facility];  Service: Orthopedics;  Laterality: Right;  right thigh   . FEMORAL-POPLITEAL BYPASS GRAFT Right 09/15/2012   Procedure: BYPASS GRAFT FEMORAL-POPLITEAL ARTERY;  Surgeon: Mal Misty, MD;  Location: [place];  Service: Vascular;  Laterality: Right;  . FEMORAL-TIBIAL BYPASS GRAFT Right 03/11/2017   Procedure: RIGHT FEMORAL-TIBIAL BYPASS IN-SITU GREATER SAPHENOUS VEIN;  Surgeon: Waynetta Sandy, MD;  Location: [place];  Service: Vascular;  Laterality: Right;  . HIP PINNING,CANNULATED  11/19/2011   Procedure: CANNULATED HIP PINNING;  Surgeon: Sanjuana Kava, MD;  Location: [facility];  Service: Orthopedics;  Laterality: Right;  . LOWER EXTREMITY ANGIOGRAPHY Bilateral 03/06/2017   Procedure: Lower Extremity Angiography;  Surgeon: Elam Dutch, MD;  Location: [place] CV LAB;  Service: Cardiovascular;  Laterality: Bilateral;  . PATCH ANGIOPLASTY Right 09/15/2012   Procedure: PATCH ANGIOPLASTY;  Surgeon: Mal Misty, MD;  Location: [place];  Service: Vascular;  Laterality: Right;  . TUBAL LIGATION      OB History    No data available       Home Medications    Prior to Admission medications   Medication Sig Start Date End Date Taking? Authorizing Provider  acetaminophen (TYLENOL) 325 MG tablet Take 2 tablets  (650 mg total) by mouth every 6 (six) hours as needed for mild pain or fever. 03/29/17  Yes Mikhail, Velta Addison, DO  aspirin EC 81 MG EC tablet Take 1 tablet (81 mg total) by mouth daily. 06/02/17  Yes Johnson, Clanford L, MD  cephALEXin (KEFLEX) 500 MG capsule Take 1 capsule (500 mg total) by mouth every 8 (eight) hours. 06/30/17  Yes Regalado, Belkys A, MD  diltiazem (CARDIZEM CD) 240 MG 24 hr capsule Take 240 mg by mouth daily.   Yes [provider]  escitalopram (LEXAPRO) 10 MG tablet Take 10 mg by mouth daily.   Yes [provider]  furosemide (LASIX) 40 MG tablet Take 1 tablet (40 mg total) by mouth every other day. 06/01/17  Yes Johnson, Clanford L, MD  gabapentin (NEURONTIN) 100 MG capsule Take 100 mg by mouth 3 (three) times daily.     Yes [provider]  hydroxyurea (HYDREA) 500 MG capsule Take 2 capsules (1,000 mg total) by mouth daily. May take with food to minimize GI side effects. 03/03/17  Yes Twana First, MD  levothyroxine (SYNTHROID, LEVOTHROID) 75 MCG tablet Take 1 tablet (75 mcg total) by mouth daily before breakfast. 06/02/17  Yes Wynetta Emery, Clanford L, MD  metolazone (ZAROXOLYN) 2.5 MG tablet Take 2.5 mg by mouth every Monday, Wednesday, and Friday.   Yes [provider]  oxyCODONE (OXY IR/ROXICODONE) 5 MG immediate release tablet Take 1 tablet (5 mg total) by mouth every 6 (six) hours as needed for moderate pain. 06/30/17  Yes Regalado, Belkys A, MD  pravastatin (PRAVACHOL) 10 MG tablet Take 1 tablet (10 mg total) by mouth daily. Patient taking differently: Take 10 mg every evening by mouth.  06/01/17  Yes Johnson, Clanford L, MD  warfarin (COUMADIN) 2.5 MG tablet Take 1 tablet (2.5 mg total) by mouth daily at 6 PM. Take one tablet all days except take 2.5mg  on MWF Patient taking differently: Take 2.5-5 mg by mouth daily at 6 PM. Take 2.5 mg tablet every day except take 5 mg on Monday's. 06/01/17 07/03/17 Yes Murlean Iba, MD    Family  History Family History  Problem Relation Age of Onset  . Heart failure Mother   . Heart disease Mother   . Stroke Father   . Heart disease Sister   . Diabetes Son   . Hypertension Sister   . Hypertension Sister   . Stroke Sister     Social History Social History   Tobacco Use  . Smoking status: Never Smoker  . Smokeless tobacco: Never Used  Substance Use Topics  . Alcohol use: No    Alcohol/week: 0.0 oz  . Drug use: No     Allergies   Patient has no known allergies.   Review of Systems Review of Systems  Constitutional: Positive for fatigue. Negative for chills and fever.  HENT: Negative for congestion.   Eyes: Negative for visual disturbance.  Respiratory: Negative for shortness of breath.   Cardiovascular: Negative for chest pain.  Gastrointestinal: Positive for nausea and vomiting. Negative for abdominal pain.  Genitourinary: Negative for dysuria and flank pain.  Musculoskeletal: Negative for back pain, neck pain and neck stiffness.  Skin: Negative for rash.  Neurological: Positive for light-headedness. Negative for headaches.     Physical Exam Updated Vital Signs BP 119/68   Pulse 86   Temp 97.6 F (36.4 C) (Oral)   Resp 15   Ht 6' (1.829 m)   Wt 72.1 kg (159 lb)   LMP  (LMP Unknown)   SpO2 (!) 89%   BMI 21.56 kg/m   Physical Exam  Constitutional: She is oriented to person, place, and time. She appears well-developed and well-nourished.  HENT:  Head: Normocephalic and atraumatic.  Dry mucous membranes  Eyes: Conjunctivae are normal. Right eye exhibits no discharge. Left eye exhibits no discharge.  Neck: Normal range of motion. Neck supple. No tracheal deviation present.  Cardiovascular: Normal rate and regular rhythm.  Pulmonary/Chest:  Decreased breath sounds bilateral  Abdominal: Soft. She exhibits no distension. There is no tenderness. There is no guarding.  Musculoskeletal: She exhibits no edema.  Neurological: She is alert and oriented  to person, place, and time.  Difficulty hearing,   Skin: Skin is warm. No rash noted.  Patient has scars from right leg bypass overall healing well. One proximate 4 cm area distal medial to right knee which is still open with bandage and no evidence of active infection. Patient has bandage on right heel ulcer which is chronic and healing slowly.  Psychiatric: She has a normal mood and affect.  Nursing note and vitals reviewed.    ED Treatments / Results  Labs (all labs ordered are listed, but  only abnormal results are displayed) Labs Reviewed  COMPREHENSIVE METABOLIC PANEL - Abnormal; Notable for the following components:      Result Value   Potassium 3.2 (*)    Chloride 98 (*)    Creatinine, Ser 1.04 (*)    Calcium 8.6 (*)    Total Protein 5.5 (*)    Albumin 2.5 (*)    ALT 9 (*)    GFR calc non Af Amer 52 (*)    GFR calc Af Amer 60 (*)    All other components within normal limits  CBC - Abnormal; Notable for the following components:   RBC 3.39 (*)    Hemoglobin 8.7 (*)    HCT 30.6 (*)    MCH 25.7 (*)    MCHC 28.4 (*)    RDW 25.3 (*)    Platelets 529 (*)    All other components within normal limits  BLOOD GAS, ARTERIAL - Abnormal; Notable for the following components:   pH, Arterial 7.478 (*)    pO2, Arterial 56.6 (*)    Bicarbonate 28.5 (*)    Acid-Base Excess 4.6 (*)    All other components within normal limits  LIPASE, BLOOD  URINALYSIS, ROUTINE W REFLEX MICROSCOPIC    EKG  EKG Interpretation None     EKG reviewed heart rate 85, atrial fibrillation, prolonged QT no acute ST elevation.  Radiology Dg Chest 2 View  Result Date: 07/03/2017 CLINICAL DATA:  Hypoxia.  History of atrial fibrillation. EXAM: CHEST  2 VIEW COMPARISON:  Single-view of the chest 06/09/2017. FINDINGS: Moderate to moderately large pleural effusions and basilar airspace disease, worse on the left, are unchanged. No pneumothorax. Left PICC present on the prior study has been removed. No  acute bony abnormality. IMPRESSION: No marked change in moderate to moderately large bilateral pleural effusions and basilar airspace disease, worse on the left. Electronically Signed   By: Inge Rise M.D.   On: 07/03/2017 14:41    Procedures Procedures (including critical care time)  Medications Ordered in ED Medications  ondansetron (ZOFRAN) injection 4 mg (4 mg Intravenous Given 07/03/17 1017)  sodium chloride 0.9 % bolus 500 mL (0 mLs Intravenous Stopped 07/03/17 1215)  ceFAZolin (ANCEF) IVPB 1 g/50 mL premix (0 g Intravenous Stopped 07/03/17 1342)     Initial Impression / Assessment and Plan / ED Course  I have reviewed the triage vital signs and the nursing notes.  Pertinent labs & imaging results that were available during my care of the patient were reviewed by me and considered in my medical decision making (see chart for details).    Patient presents for nausea and vomiting and general malaise. Likely combination of her chronic medical conditions and multiple different medication she is on. Plan for IV fluids, blood work, screening, nausea meds and fluid bolus.   UA pending, po challenge.   Ancef was given patient as she was unable to keep Keflex down.  Patient's vomiting improved on reassessment. Nursing noticed patient's oxygenation was dropping. Patient was made sure she was on her 4 L nasal cannula that she is on at home however oxygen saturation still in the 80s. Patient's nonencephalopathic and does not feel more short of breath than usual, she is exerting herself. Chest x-ray performed showing high lateral pleural effusions. Likely a combination pleural effusions, COPD.  Plan for nebulizer and admission to the hospital. Discussed holding her new antidepressant medication that led to her vomiting she is only had one dose and get follow-up with her  primary doctor next week. The patients results and plan were reviewed and discussed.   Any x-rays performed were  independently reviewed by myself.   Differential diagnosis were considered with the presenting HPI.  Medications  ondansetron (ZOFRAN) injection 4 mg (4 mg Intravenous Given 07/03/17 1017)  sodium chloride 0.9 % bolus 500 mL (0 mLs Intravenous Stopped 07/03/17 1215)  ceFAZolin (ANCEF) IVPB 1 g/50 mL premix (0 g Intravenous Stopped 07/03/17 1342)    Vitals:   07/03/17 1300 07/03/17 1330 07/03/17 1400 07/03/17 1430  BP: 125/68 127/78 107/67 119/68  Pulse: 83 90 92 86  Resp: 14 20 (!) 22 15  Temp:      TempSrc:      SpO2: 90% (!) 84% (!) 84% (!) 89%  Weight:      Height:        Final diagnoses:  Intractable vomiting with nausea, unspecified vomiting type  Hypoxia  Dyspnea on exertion  Pleural effusion, bilateral    On reassessment patient improved sitting up. Patient's oxygen 92% on 4 L which is her home dose. No indication for emergent admission at this time. Pleural effusion similar to previous imaging. Patient understand she needs appointment with her doctor Monday or Tuesday.  Final Clinical Impressions(s) / ED Diagnoses   Final diagnoses:  Intractable vomiting with nausea, unspecified vomiting type  Hypoxia  Dyspnea on exertion  Pleural effusion, bilateral    ED Discharge Orders    None       Elnora Morrison, MD 07/03/17 1515    Elnora Morrison, MD 07/03/17 1516    Elnora Morrison, MD 07/03/17 [phone]

## 2017-07-06 DIAGNOSIS — I110 Hypertensive heart disease with heart failure: Secondary | ICD-10-CM | POA: Diagnosis not present

## 2017-07-06 DIAGNOSIS — T8140XD Infection following a procedure, unspecified, subsequent encounter: Secondary | ICD-10-CM | POA: Diagnosis not present

## 2017-07-06 DIAGNOSIS — L03115 Cellulitis of right lower limb: Secondary | ICD-10-CM | POA: Diagnosis not present

## 2017-07-06 DIAGNOSIS — B9689 Other specified bacterial agents as the cause of diseases classified elsewhere: Secondary | ICD-10-CM | POA: Diagnosis not present

## 2017-07-06 DIAGNOSIS — I5042 Chronic combined systolic (congestive) and diastolic (congestive) heart failure: Secondary | ICD-10-CM | POA: Diagnosis not present

## 2017-07-06 DIAGNOSIS — L89890 Pressure ulcer of other site, unstageable: Secondary | ICD-10-CM | POA: Diagnosis not present

## 2017-07-07 ENCOUNTER — Other Ambulatory Visit: Payer: Self-pay | Admitting: *Deleted

## 2017-07-07 DIAGNOSIS — L89890 Pressure ulcer of other site, unstageable: Secondary | ICD-10-CM | POA: Diagnosis not present

## 2017-07-07 DIAGNOSIS — I110 Hypertensive heart disease with heart failure: Secondary | ICD-10-CM | POA: Diagnosis not present

## 2017-07-07 DIAGNOSIS — B9689 Other specified bacterial agents as the cause of diseases classified elsewhere: Secondary | ICD-10-CM | POA: Diagnosis not present

## 2017-07-07 DIAGNOSIS — L03115 Cellulitis of right lower limb: Secondary | ICD-10-CM | POA: Diagnosis not present

## 2017-07-07 DIAGNOSIS — I5042 Chronic combined systolic (congestive) and diastolic (congestive) heart failure: Secondary | ICD-10-CM | POA: Diagnosis not present

## 2017-07-07 DIAGNOSIS — T8140XD Infection following a procedure, unspecified, subsequent encounter: Secondary | ICD-10-CM | POA: Diagnosis not present

## 2017-07-07 NOTE — Patient Outreach (Signed)
[facility]) Care Management   07/07/2017  SLOANE PALMER [birth year] [id number]  Charlotte [name] is an 76 y.o. 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 She was referred to [place] by  [facility]in [facility] [place] [facility] for multiple admissions over the last month along with multiple ED visits on 07/06/17.   [facility] CM spoke with Mr Dierolf on 07/06/17  This is the initial [facility] CM home visit and transition of care services.  Subjective: see notes below  Objective:   BP 130/80   Pulse 78   Temp (!) 97.2 F (36.2 C) (Oral)   Resp 20   LMP  (LMP Unknown)   SpO2 93%   Review of Systems  Constitutional: Negative for chills, fever, malaise/fatigue and weight loss.  Neurological: Positive for weakness.    Physical Exam  Encounter Medications:   Outpatient Encounter Medications as of 07/07/2017  Medication Sig  . acetaminophen (TYLENOL) 325 MG tablet Take 2 tablets (650 mg total) by mouth every 6 (six) hours as needed for mild pain or fever.  Marland Kitchen aspirin EC 81 MG EC tablet Take 1 tablet (81 mg total) by mouth daily.  . cephALEXin (KEFLEX) 500 MG capsule Take 1 capsule (500 mg total) by mouth every 8 (eight) hours.  Marland Kitchen diltiazem (CARDIZEM CD) 240 MG 24 hr capsule Take 240 mg by mouth daily.  Marland Kitchen escitalopram (LEXAPRO) 10 MG tablet Take 10 mg by mouth daily.  . furosemide (LASIX) 40 MG tablet Take 1 tablet (40 mg total) by mouth every other day.  . gabapentin (NEURONTIN) 100 MG capsule Take 100 mg by mouth 3 (three) times daily.    . hydroxyurea (HYDREA) 500 MG capsule Take 2 capsules (1,000 mg total) by mouth daily. May take with food to minimize GI side effects.  Marland Kitchen  levothyroxine (SYNTHROID, LEVOTHROID) 75 MCG tablet Take 1 tablet (75 mcg total) by mouth daily before breakfast.  . metolazone (ZAROXOLYN) 2.5 MG tablet Take 2.5 mg by mouth every Monday, Wednesday, and Friday.  . ondansetron (ZOFRAN ODT) 4 MG disintegrating tablet 44m ODT q4 hours prn nausea/vomit  . oxyCODONE (OXY IR/ROXICODONE) 5 MG immediate release tablet Take 1 tablet (5 mg total) by mouth every 6 (six) hours as needed for moderate pain.  . pravastatin (PRAVACHOL) 10 MG tablet Take 1 tablet (10 mg total) by mouth daily. (Patient taking differently: Take 10 mg every evening by mouth. )  . warfarin (COUMADIN) 2.5 MG tablet Take 1 tablet (2.5 mg total) by mouth daily at 6 PM. Take one tablet all days except take 2.576mon MWF (Patient taking differently: Take 2.5-5 mg by mouth daily at 6 PM. Take 2.5 mg tablet every day except take 5 mg on Monday's.)   No facility-administered encounter medications on file as of 07/07/2017.     Functional Status:   In your present state of health, do you have any difficulty performing the following activities: 06/28/2017 06/08/2017  Hearing? Y Tempie DonningVision? Y Y  Difficulty concentrating or making decisions? N N  Walking or climbing stairs? Y Y  Comment - pt can not walk up and down the stairs  Dressing or bathing? Y Y  Doing errands, shopping? Y Y  Comment - pt needs someone to go with her to  appts  Some recent data might be hidden    Fall/Depression Screening:    Fall Risk  07/14/2014 06/16/2014  Falls in the past year? No No   PHQ 2/9 Scores 06/16/2014  PHQ - 2 Score 0    Assessment:    [facility] CM met Mrs Travaglini, her husband and son at her home.  She presents in her living room in a hospital bed. She is pleasant and cooperative and states she is hard of hearing Requests Cm also speak with her husband to answer questions Discharged last on 06/30/17  To ED and admitted 5 times in the last 6 months Last ED visit related to medication making her "sick"  (emesis)  Has DNR Form completed 06/30/14 effective date and 06/11/17 advance directives completed- See Epic advance directive section   Hospitalizations/ ED visits 02/12/17 to 02/15/17 for CAP- d/c home with home health 03/04/17 to 03/29/17 she had surgery at [facility], dx with septic shock with limb ischemia and cellulitis and stayed hospitalized for a month  She was then sent to [facility]) for about "two months" and then d/c home on 05/27/17  05/28/17 to 06/01/17 hospitalized for hypotension after evaluated by her home health RN to find a BP of 60/40 after taking metoprolol-D/c home with [facility] 06/08/17 to 06/11/17 hospitalized after transferred from vascular surgery to [facility] for edema of right leg 06/27/17 to 06/30/17 hospitalized for cellulitis of her right foot- d/c to home with Home health On 07/03/17 the family called ems to take her to [facility] ED for emesis after taking Lexapro.  Lexapro made her sick flushed  -heaving no vomiting wheezing. The ED MD stopped the Lexapro.  She was discharged back home. The family reports discontinuing  Lexapro. Reports Oxycodone also "makes her sick"  Cellulitis/PAD Right foot with blacken toes Hx of cellulitis  CHF CM notes swelling of left hand. Her husband reports she has swelling mainly in her hand versus abdomen or ankles. Her husband states he monitors her hands and bruising of her right foot bruises as she bruises easily anyway.   DME  Oxygen at 4 L/ She reports she has oxygen sats "up and down"  Hearing aid has them but does not like to wear "they hurt my ears recommend sound illumination devices in which her husband has not obtained yet  She does not wear her eye glasses Cataracts to be removed soon PICC line -taken out about 2 weeks ago - it was being used for iv antibiotics at home   Nutrition/Weight Drinks water coffee tea coke, Orange juice Does not like Boost She reports her weight was 200 lbs but reports she is now at 133  lbs but wants to be at 160 lbs   Falls none this year per pt/family Depression She and the family reports her depression "got better when I started walking" No longer taking Lexapro  Pain no pain today sometimes in right foot heel cracked up put meds on and bandages when rubs on sheets  [facility] ([facility]) came in at the end of [facility] CM visit-  [facility] started already RN came Monday 07/06/17   Has access to Internet with the assistance of her son, "Rusty"- Web designer e-mail [email or web address] Medicaid applied x 2 but denied Transportation- uses [facility] [place] takes her to Dr Vella Redhead $85 round trip. Family inquired about other possible options  Upcoming appointments:  see Mallory Shirk  Friday 07/10/17 at 3 pm  see surgeon Dr Vella Redhead on 07/17/17  See Oncology at [facility]  [facility]  see eye dr at [facility] dr ? Jorja Loa To see Dr [name] hunt 08/20/17 [place] eye surgeon     Plan:  Home visit in 2 weeks   Route note to pcp and other care team members  Referral to [facility] medical transportation,  community resources Referral to [place] reconcile medicines, cost recommend nausea low cost medication- cheaper pill box Difficulty with home antibiotics and extra prns like Zofran   Reviewed the CHF action plan  Bath room no rails  Hypotension concerns  Reviewed the local Paramedic program (referral) Pt/family was given a [facility] calendar  Send out reach letters to patient and MD  [facility] CM Care Plan Problem One     Most Recent Value  Care Plan Problem One  Referrals to [facility] and pharmacy services  Role Documenting the Problem One  Care Management [place] for Problem One  Active  [facility] Long Term Goal   over the next 45 days Patient will receive assistance from [place] and pharmacy services  [place] Term Goal Start Date  07/07/17  Interventions for Problem One Long Term Goal  Reviewed [place] services, referred to [place], collaborate with [place] CM Short Term Goal #1   over the next 30 days Patient will receive assistance from [facility] SW and pharmacy  [facility] CM Short Term Goal #1 Start Date  07/07/17  Interventions for Short Term Goal #1  Reviewed [place] services, referred to [place], collaborate with [place] Problem Two     Most Recent Value  Care Plan Problem Two  Knowledge of major medical diagnosis (AFIB, CHF, HTN, cellulitis)  Role Documenting the Problem Two  Care Management Coordinator  Care Plan for Problem Two  Active  Interventions for Problem Two Long Term Goal   Revieded CHF action plan, review home care plans for AFIB, HTN and cellulitis using teach back method, assist to get needed DME, Encouraged checking BP prior to HTN medicine and hold parameters,, review levels of care providers  [facility] Long Term Goal  over the next 60 days the patient and family will be able to verbalize knowledge of home care of major medical diagnosis (AFIB, CHF, HTN, cellulitis), hypotension) and levels of care providers   [facility] Long Term Goal Start Date  07/07/17  [facility] CM Short Term Goal #1   over the next 30 days patient and family will be able to identify which level of care provider for home symptonm  [facility] CM Short Term Goal #1 Start Date  07/07/17  Interventions for Short Term Goal #2   Review paramedic program, 24 nurse line services, what symptoms to call in to MD office, urgent care staff, home health agency staff, Cm staff, Review levels of care       Aleighna Wojtas L. Lavina Hamman, RN, BSN, [place] Care Management [phone]

## 2017-07-09 DIAGNOSIS — B9689 Other specified bacterial agents as the cause of diseases classified elsewhere: Secondary | ICD-10-CM | POA: Diagnosis not present

## 2017-07-09 DIAGNOSIS — L89890 Pressure ulcer of other site, unstageable: Secondary | ICD-10-CM | POA: Diagnosis not present

## 2017-07-09 DIAGNOSIS — T8140XD Infection following a procedure, unspecified, subsequent encounter: Secondary | ICD-10-CM | POA: Diagnosis not present

## 2017-07-09 DIAGNOSIS — I110 Hypertensive heart disease with heart failure: Secondary | ICD-10-CM | POA: Diagnosis not present

## 2017-07-09 DIAGNOSIS — L03115 Cellulitis of right lower limb: Secondary | ICD-10-CM | POA: Diagnosis not present

## 2017-07-09 DIAGNOSIS — I5042 Chronic combined systolic (congestive) and diastolic (congestive) heart failure: Secondary | ICD-10-CM | POA: Diagnosis not present

## 2017-07-10 ENCOUNTER — Other Ambulatory Visit: Payer: Self-pay | Admitting: *Deleted

## 2017-07-10 DIAGNOSIS — Z6821 Body mass index (BMI) 21.0-21.9, adult: Secondary | ICD-10-CM | POA: Diagnosis not present

## 2017-07-10 DIAGNOSIS — K529 Noninfective gastroenteritis and colitis, unspecified: Secondary | ICD-10-CM | POA: Diagnosis not present

## 2017-07-14 ENCOUNTER — Other Ambulatory Visit: Payer: Self-pay | Admitting: *Deleted

## 2017-07-14 DIAGNOSIS — L89890 Pressure ulcer of other site, unstageable: Secondary | ICD-10-CM | POA: Diagnosis not present

## 2017-07-14 DIAGNOSIS — I5042 Chronic combined systolic (congestive) and diastolic (congestive) heart failure: Secondary | ICD-10-CM | POA: Diagnosis not present

## 2017-07-14 DIAGNOSIS — B9689 Other specified bacterial agents as the cause of diseases classified elsewhere: Secondary | ICD-10-CM | POA: Diagnosis not present

## 2017-07-14 DIAGNOSIS — I110 Hypertensive heart disease with heart failure: Secondary | ICD-10-CM | POA: Diagnosis not present

## 2017-07-14 DIAGNOSIS — L03115 Cellulitis of right lower limb: Secondary | ICD-10-CM | POA: Diagnosis not present

## 2017-07-14 DIAGNOSIS — T8140XD Infection following a procedure, unspecified, subsequent encounter: Secondary | ICD-10-CM | POA: Diagnosis not present

## 2017-07-14 NOTE — Patient Outreach (Signed)
[place] [facility]) Care Management  07/14/2017  YARITZI CRAUN 28-Jun-1941 [id number]   Care coordination  CM consulted by Mrs Amalfitano',  son, Stormy Card via e mail related assistance with finding a tub bench for patient EPIC CM notes reviewed to note use of [facility] for DME previously  Cm called and left a voice message for Dr Hilma Favors his RN or NP for an order to be called into [facility]. An e mail sent to [place] at [place] also.   Cm consulted with Dionta at [facility] about this DME The tub bench may or may not be an out of pocket expense  Cm spoke with Mr Porshe Fleagle when called Mrs Skoda home about the order request to be sent to advanced and that it may be possibly an out of pocket expense.  [name] informed CM he was informed the tub bench may be found at a lower price at a "goodwill"   Plans to follow up related to DME when receive further response from pcp or DME company and for follow up with Mrs Hechler for a home visit next week   Inesha Sow L. Lavina Hamman, RN, BSN, [place] Care Management [phone]

## 2017-07-15 ENCOUNTER — Other Ambulatory Visit: Payer: Self-pay | Admitting: *Deleted

## 2017-07-15 DIAGNOSIS — T8140XD Infection following a procedure, unspecified, subsequent encounter: Secondary | ICD-10-CM | POA: Diagnosis not present

## 2017-07-15 DIAGNOSIS — I5042 Chronic combined systolic (congestive) and diastolic (congestive) heart failure: Secondary | ICD-10-CM | POA: Diagnosis not present

## 2017-07-15 DIAGNOSIS — B9689 Other specified bacterial agents as the cause of diseases classified elsewhere: Secondary | ICD-10-CM | POA: Diagnosis not present

## 2017-07-15 DIAGNOSIS — L03115 Cellulitis of right lower limb: Secondary | ICD-10-CM | POA: Diagnosis not present

## 2017-07-15 DIAGNOSIS — L89890 Pressure ulcer of other site, unstageable: Secondary | ICD-10-CM | POA: Diagnosis not present

## 2017-07-15 DIAGNOSIS — I110 Hypertensive heart disease with heart failure: Secondary | ICD-10-CM | POA: Diagnosis not present

## 2017-07-15 NOTE — Patient Outreach (Signed)
[facility] [facility]) Care Management  07/15/2017  Charlotte [name] 05-Apr-1941 [id number]   Opened in error  [name] L. Lavina Hamman, RN, BSN, [place] Care Management [phone]

## 2017-07-15 NOTE — Patient Outreach (Signed)
[place] [facility]) Care Management  07/15/2017  SECRET KRISTENSEN September 01, 1940 [id number]   Care coordination  [facility] CM able to find tub bench at discounted price on 07/14/17. Consulted Mr Bookwalter on 07/14/17 to confirm Provided Mrs Mahler with discounted extended length tub bench with back. Mr and Mrs Mrs very pleased with services rendered.   Mrs Kirsh presented alert and smiling but HOH in her hospital bed in her living room of her home. Discussed again the calls made to pcp office and [facility]. Informed Mr Concepcion he could inform pcp and [facility] staff DME already provided if he received calls   Plans Follow up with [facility] CM home visit with Mrs Lupa in 1-2 weeks   [name] L. Lavina Hamman, RN, BSN, [place] Care Management [phone]

## 2017-07-16 DIAGNOSIS — I5042 Chronic combined systolic (congestive) and diastolic (congestive) heart failure: Secondary | ICD-10-CM | POA: Diagnosis not present

## 2017-07-16 DIAGNOSIS — I110 Hypertensive heart disease with heart failure: Secondary | ICD-10-CM | POA: Diagnosis not present

## 2017-07-16 DIAGNOSIS — L03115 Cellulitis of right lower limb: Secondary | ICD-10-CM | POA: Diagnosis not present

## 2017-07-16 DIAGNOSIS — B9689 Other specified bacterial agents as the cause of diseases classified elsewhere: Secondary | ICD-10-CM | POA: Diagnosis not present

## 2017-07-16 DIAGNOSIS — L89890 Pressure ulcer of other site, unstageable: Secondary | ICD-10-CM | POA: Diagnosis not present

## 2017-07-16 DIAGNOSIS — T8140XD Infection following a procedure, unspecified, subsequent encounter: Secondary | ICD-10-CM | POA: Diagnosis not present

## 2017-07-17 ENCOUNTER — Ambulatory Visit (INDEPENDENT_AMBULATORY_CARE_PROVIDER_SITE_OTHER)
Admit: 2017-07-17 | Discharge: 2017-07-17 | Disposition: A | Discharge status: Home / Self Care | Payer: Medicare Other | Attending: Vascular Surgery | Admitting: Vascular Surgery

## 2017-07-17 ENCOUNTER — Encounter: Payer: Self-pay | Admitting: Vascular Surgery

## 2017-07-17 ENCOUNTER — Ambulatory Visit (INDEPENDENT_AMBULATORY_CARE_PROVIDER_SITE_OTHER): Payer: Medicare Other | Admitting: Vascular Surgery

## 2017-07-17 ENCOUNTER — Ambulatory Visit (HOSPITAL_COMMUNITY)
Admission: RE | Admit: 2017-07-17 | Discharge: 2017-07-17 | Disposition: A | Discharge status: Home / Self Care | Payer: Medicare Other | Source: Ambulatory Visit | Attending: Vascular Surgery | Admitting: Vascular Surgery

## 2017-07-17 VITALS — BP 99/64 | HR 84 | Temp 97.6°F | Resp 18 | Ht 72.0 in | Wt 159.0 lb

## 2017-07-17 DIAGNOSIS — I739 Peripheral vascular disease, unspecified: Secondary | ICD-10-CM | POA: Diagnosis not present

## 2017-07-17 DIAGNOSIS — R0989 Other specified symptoms and signs involving the circulatory and respiratory systems: Secondary | ICD-10-CM | POA: Insufficient documentation

## 2017-07-17 DIAGNOSIS — R9389 Abnormal findings on diagnostic imaging of other specified body structures: Secondary | ICD-10-CM | POA: Insufficient documentation

## 2017-07-17 NOTE — Progress Notes (Signed)
Patient ID: NOOR WITTE, female   DOB: March 21, 1941, 76 y.o.   MRN: [id number]  Reason for Consult: Follow-up (3-4 wk f/u )   Referred by [name], MD  Subjective:     HPI:  DORREEN VALIENTE is a 76 y.o. female with history of right femoral to TP trunk bypass with vein in August of this year.  She initially had a long hospital stay went to rehab after this.  She is now recovered she is at home.  She does take Coumadin for her atrial fibrillation.  She is also on aspirin and statin.  She has home health care for a wound on her right medial leg from the distal anastomosis incision and also her right fourth and fifth toes gangrene.  She is having some pain in the leg but she is able to walk at this time.  Otherwise she does have significant shortness of breath as well as leg swelling bilaterally and she is wearing oxygen at home  Past Medical History:  Diagnosis Date  . Anxiety   . Arthritis   . Atrial fibrillation, chronic (HCC)    a. on Coumadin for anticoagulation.   . Cellulitis 06/29/2017   RIGHT LOWER LEG  . CHF (congestive heart failure) ([place])    combined  . COPD (chronic obstructive pulmonary disease) ([place])    on home O2  . Deaf   . 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 DVT (deep venous thrombosis) ([place])   . Essential hypertension   . GERD (gastroesophageal reflux disease)   . Hyperlipidemia   . Hypothyroidism   . Peripheral neuropathy   . Peripheral vascular disease ([place])   . 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 Ulcer of ankle ([place])    Bilateral 2015  . Varicose veins    Family History  Problem Relation Age of Onset  . Heart failure Mother   . Heart disease Mother   . Stroke Father   . Heart disease Sister   . Diabetes Son   . Hypertension Sister   . Hypertension Sister   . Stroke Sister    Past Surgical History:  Procedure Laterality Date  . ABDOMINAL AORTAGRAM N/A 09/14/2012   Procedure: ABDOMINAL [name];  Surgeon: Serafina Mitchell, MD;  Location:  [facility] CATH LAB;  Service: Cardiovascular;  Laterality: N/A;  . ABDOMINAL AORTOGRAM N/A 03/06/2017   Procedure: ABDOMINAL AORTOGRAM;  Surgeon: Elam Dutch, MD;  Location: [place] CV LAB;  Service: Cardiovascular;  Laterality: N/A;  . ABDOMINAL HYSTERECTOMY    . CORONARY ANGIOPLASTY    . DRESSING CHANGE UNDER ANESTHESIA  12/02/2011   Procedure: DRESSING CHANGE UNDER ANESTHESIA;  Surgeon: Carole Civil, MD;  Location: [facility];  Service: Orthopedics;  Laterality: Right;  . ENDARTERECTOMY FEMORAL Right 09/15/2012   Procedure: ENDARTERECTOMY FEMORAL;  Surgeon: Mal Misty, MD;  Location: [place];  Service: Vascular;  Laterality: Right;  . FASCIOTOMY  11/29/2011   Procedure: FASCIOTOMY;  Surgeon: Carole Civil, MD;  Location: [facility];  Service: Orthopedics;  Laterality: Right;  right thigh   . FEMORAL-POPLITEAL BYPASS GRAFT Right 09/15/2012   Procedure: BYPASS GRAFT FEMORAL-POPLITEAL ARTERY;  Surgeon: Mal Misty, MD;  Location: [place];  Service: Vascular;  Laterality: Right;  . FEMORAL-TIBIAL BYPASS GRAFT Right 03/11/2017   Procedure: RIGHT FEMORAL-TIBIAL BYPASS IN-SITU GREATER SAPHENOUS VEIN;  Surgeon: Waynetta Sandy, MD;  Location: [place];  Service: Vascular;  Laterality: Right;  . HIP PINNING,CANNULATED  11/19/2011  Procedure: CANNULATED HIP PINNING;  Surgeon: Sanjuana Kava, MD;  Location[facility];  Service: Orthopedics;  Laterality: Right;  . LOWER EXTREMITY ANGIOGRAPHY Bilateral 03/06/2017   Procedure: Lower Extremity Angiography;  Surgeon: Elam Dutch, MD;  Location: [place] CV LAB;  Service: Cardiovascular;  Laterality: Bilateral;  . PATCH ANGIOPLASTY Right 09/15/2012   Procedure: PATCH ANGIOPLASTY;  Surgeon: Mal Misty, MD;  Location: [place];  Service: Vascular;  Laterality: Right;  . TUBAL LIGATION      Short Social History:  Social History   Tobacco Use  . Smoking status: Never Smoker  . Smokeless tobacco: Never Used  Substance Use Topics  . Alcohol  use: No    Alcohol/week: 0.0 oz    No Known Allergies  Current Outpatient Medications  Medication Sig Dispense Refill  . acetaminophen (TYLENOL) 325 MG tablet Take 2 tablets (650 mg total) by mouth every 6 (six) hours as needed for mild pain or fever.    Marland Kitchen aspirin EC 81 MG EC tablet Take 1 tablet (81 mg total) by mouth daily.    . cephALEXin (KEFLEX) 500 MG capsule Take 1 capsule (500 mg total) by mouth every 8 (eight) hours. 21 capsule 0  . diltiazem (CARDIZEM CD) 240 MG 24 hr capsule Take 240 mg by mouth daily.    . furosemide (LASIX) 40 MG tablet Take 1 tablet (40 mg total) by mouth every other day. 30 tablet   . gabapentin (NEURONTIN) 100 MG capsule Take 100 mg by mouth 3 (three) times daily.      . hydroxyurea (HYDREA) 500 MG capsule Take 2 capsules (1,000 mg total) by mouth daily. May take with food to minimize GI side effects. 60 capsule 2  . levothyroxine (SYNTHROID, LEVOTHROID) 75 MCG tablet Take 1 tablet (75 mcg total) by mouth daily before breakfast.    . metolazone (ZAROXOLYN) 2.5 MG tablet Take 2.5 mg by mouth every Monday, Wednesday, and Friday.    . ondansetron (ZOFRAN ODT) 4 MG disintegrating tablet 4mg  ODT q4 hours prn nausea/vomit 4 tablet 0  . oxyCODONE (OXY IR/ROXICODONE) 5 MG immediate release tablet Take 1 tablet (5 mg total) by mouth every 6 (six) hours as needed for moderate pain. 20 tablet 0  . pravastatin (PRAVACHOL) 10 MG tablet Take 1 tablet (10 mg total) by mouth daily. (Patient taking differently: Take 10 mg every evening by mouth. )    . warfarin (COUMADIN) 2.5 MG tablet Take 1 tablet (2.5 mg total) by mouth daily at 6 PM. Take one tablet all days except take 2.5mg  on MWF (Patient taking differently: Take 2.5-5 mg by mouth daily at 6 PM. Take 2.5 mg tablet every day except take 5 mg on Monday's.) 10 tablet 0   No current facility-administered medications for this visit.     Review of Systems  Constitutional:  Constitutional negative. HENT: HENT negative.    Eyes: Eyes negative.  Respiratory: Positive for shortness of breath.  Cardiovascular: Positive for dyspnea with exertion and leg swelling.  GI: Gastrointestinal negative.  Musculoskeletal: Positive for leg pain.  Skin: Positive for wound.  Neurological: Positive for dizziness and numbness.  Hematologic: Positive for bruises/bleeds easily.  Psychiatric: Psychiatric negative.        Objective:  Objective   Vitals:   07/17/17 1522  BP: 99/64  Pulse: 84  Resp: 18  Temp: 97.6 F (36.4 C)  TempSrc: Oral  SpO2: 97%  Weight: 159 lb (72.1 kg)  Height: 6' (1.829 m)   Body mass  index is 21.56 kg/m.  Physical Exam  Constitutional: She appears well-developed.  HENT:  Head: Normocephalic.  Eyes: Pupils are equal, round, and reactive to light.  Neck: Normal range of motion.  Cardiovascular: Normal rate.  Pulses:      Radial pulses are 2+ on the right side, and 2+ on the left side.       Femoral pulses are 2+ on the right side, and 2+ on the left side. Palpable pulse in right bypass graft Monophasic signals in right dp/pt  Abdominal: Soft. She exhibits no mass.  Musculoskeletal: Normal range of motion. She exhibits edema.  Neurological: She is alert.  Psychiatric: She has a normal mood and affect. Her behavior is normal.    Data: I independently interpreted her right lower extremity duplex which demonstrates an elevated velocity of 206 near the knee with a ratio 3.5.  The ABI is 0.67 with a toe pressure of 47 monophasic signals on the right PT and DP.  On the left side she has triphasic waveforms with noncompressible ABIs and toe pressure 76.     Assessment/Plan:     76 year old female status post right femoral to TP trunk bypass.  She has ulceration on her right fourth and fifth toes she is healing her below-knee incision at this time.  She does have an elevated velocity with decreased ABIs and toe pressures on the right.  For that reason we will proceed with angiogram from  the left common femoral approach possible intervention on the bypass itself versus the runoff.  She will need to hold Coumadin for 72 hours prior to procedure.  Family demonstrates good understanding we will get her scheduled for early in the new year.     Waynetta Sandy MD [facility]

## 2017-07-20 ENCOUNTER — Other Ambulatory Visit: Payer: Self-pay | Admitting: *Deleted

## 2017-07-20 DIAGNOSIS — I110 Hypertensive heart disease with heart failure: Secondary | ICD-10-CM | POA: Diagnosis not present

## 2017-07-20 DIAGNOSIS — B9689 Other specified bacterial agents as the cause of diseases classified elsewhere: Secondary | ICD-10-CM | POA: Diagnosis not present

## 2017-07-20 DIAGNOSIS — I5042 Chronic combined systolic (congestive) and diastolic (congestive) heart failure: Secondary | ICD-10-CM | POA: Diagnosis not present

## 2017-07-20 DIAGNOSIS — L89890 Pressure ulcer of other site, unstageable: Secondary | ICD-10-CM | POA: Diagnosis not present

## 2017-07-20 DIAGNOSIS — T8140XD Infection following a procedure, unspecified, subsequent encounter: Secondary | ICD-10-CM | POA: Diagnosis not present

## 2017-07-20 DIAGNOSIS — L03115 Cellulitis of right lower limb: Secondary | ICD-10-CM | POA: Diagnosis not present

## 2017-07-20 NOTE — Patient Outreach (Signed)
Charlotte [name]) Care Management  07/20/2017  Charlotte [name] Aug 31, 1940 [id number]   CSW had received referral from Charlotte [name], Charlotte [name] for  transportation (uses [facility] and [facility] but [facility] cost is issue), medicaid evaluation and treatment plan (applied twice already), other community resources. CSW called & spoke with patient's husband, Charlotte [name] (ph#: [phone]) as patient was taking a nap. Patient's husband informed CSW that patient has progressed with home health physical therapy and is now able to get in and out of their car therefore, no longer needs to use [facility] for transportation. CSW spoke with patient's husband about Medicaid, but husband states that they have tried to apply twice now but were denied. CSW encouraged patient's husband to speak with an elderlaw attorney to see if they could possibly move ownership of items for future dealings with Medicaid. Patient's husband states that as of now, he does not feel a need for social work visit or continued involvement - CSW will close case. CSW will notify patient's RNCM with [facility], Kim of CSW's plans to close patient's case. CSW ensured patient's husband RNCM with [facility] continued involvement with patient's care.    Raynaldo Opitz, LCSW [facility]  Clinical Social Worker cell #: [phone]

## 2017-07-21 ENCOUNTER — Other Ambulatory Visit: Payer: Self-pay | Admitting: *Deleted

## 2017-07-21 DIAGNOSIS — B9689 Other specified bacterial agents as the cause of diseases classified elsewhere: Secondary | ICD-10-CM | POA: Diagnosis not present

## 2017-07-21 DIAGNOSIS — T8140XD Infection following a procedure, unspecified, subsequent encounter: Secondary | ICD-10-CM | POA: Diagnosis not present

## 2017-07-21 DIAGNOSIS — L03115 Cellulitis of right lower limb: Secondary | ICD-10-CM | POA: Diagnosis not present

## 2017-07-21 DIAGNOSIS — I110 Hypertensive heart disease with heart failure: Secondary | ICD-10-CM | POA: Diagnosis not present

## 2017-07-21 DIAGNOSIS — I5042 Chronic combined systolic (congestive) and diastolic (congestive) heart failure: Secondary | ICD-10-CM | POA: Diagnosis not present

## 2017-07-21 DIAGNOSIS — L89890 Pressure ulcer of other site, unstageable: Secondary | ICD-10-CM | POA: Diagnosis not present

## 2017-07-21 NOTE — Patient Outreach (Signed)
[facility]) Care Management   07/21/2017  Charlotte [name] 13-Oct-1940 [id number]  Charlotte [name] is an 76 y.o. 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 She was referred to [place] by  Marin Comment of [place] center for multiple admissions over the last 6 months along with multiple ED visits on 07/06/17. She has had 6 admissions with ED visits in the last 6 months.  Subjective:  See notes below Objective:   Review of Systems  Constitutional: Negative for chills, diaphoresis, fever, malaise/fatigue and weight loss.       Given out of breath walking to bathroom  HENT: Positive for hearing loss. Negative for congestion, ear discharge, ear pain, nosebleeds, sinus pain, sore throat and tinnitus.   Eyes: Positive for blurred vision. Negative for double vision, photophobia, pain, discharge and redness.       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  Negative for cough, hemoptysis, sputum production, shortness of breath, wheezing and stridor.   Cardiovascular: Positive for leg swelling. Negative for chest pain, palpitations, orthopnea, claudication and PND.  Gastrointestinal: Negative.  Negative for abdominal pain, blood in stool, constipation, diarrhea, heartburn, melena, nausea and vomiting.       Last BM 07/20/17   Genitourinary: Positive for frequency and hematuria. Negative for dysuria, flank pain and urgency.  Musculoskeletal: Positive for joint pain. Negative for back pain, falls, myalgias and neck pain.       Only pain is in right foot  Skin: Negative.  Negative for itching and rash.  Neurological: Positive for tingling, tremors, sensory  change and weakness. Negative for dizziness, speech change, focal weakness, seizures, loss of consciousness and headaches.       Neuropathy in feet esp right  Had tremors of hands but better   Endo/Heme/Allergies: Negative.  Negative for environmental allergies and polydipsia. Does not bruise/bleed easily.  Psychiatric/Behavioral: Negative.  Negative for depression, hallucinations, memory loss, substance abuse and suicidal ideas. The patient is not nervous/anxious and does not have insomnia.     Physical Exam  Constitutional: She appears well-developed and well-nourished.  HENT:  Head: Normocephalic and atraumatic.  Eyes: Conjunctivae are normal. Pupils are equal, round, and reactive to light.  Neck: Normal range of motion. Neck supple.    Encounter Medications:   Outpatient Encounter Medications as of 07/21/2017  Medication Sig  . acetaminophen (TYLENOL) 325 MG tablet Take 2 tablets (650 mg total) by mouth every 6 (six) hours as needed for mild pain or fever.  Marland Kitchen aspirin EC 81 MG EC tablet Take 1 tablet (81 mg total) by mouth daily.  . cephALEXin (KEFLEX) 500 MG capsule Take 1 capsule (500 mg total) by mouth every 8 (eight) hours.  Marland Kitchen diltiazem (CARDIZEM CD) 240 MG 24 hr capsule Take 240 mg by mouth daily.  . furosemide (LASIX) 40 MG tablet Take 1 tablet (40 mg total) by mouth every other day.  . gabapentin (NEURONTIN) 100 MG capsule Take 100 mg by mouth 3 (three) times daily.    . hydroxyurea (HYDREA) 500 MG capsule Take 2 capsules (1,000 mg total) by mouth daily. May take with food to minimize GI side effects.  Marland Kitchen levothyroxine (SYNTHROID, LEVOTHROID) 75 MCG tablet Take 1 tablet (75 mcg total) by mouth daily before  breakfast.  . metolazone (ZAROXOLYN) 2.5 MG tablet Take 2.5 mg by mouth every Monday, Wednesday, and Friday.  . ondansetron (ZOFRAN ODT) 4 MG disintegrating tablet 86m ODT q4 hours prn nausea/vomit  . oxyCODONE (OXY IR/ROXICODONE) 5 MG immediate release tablet Take 1 tablet (5  mg total) by mouth every 6 (six) hours as needed for moderate pain.  . pravastatin (PRAVACHOL) 10 MG tablet Take 1 tablet (10 mg total) by mouth daily. (Patient taking differently: Take 10 mg every evening by mouth. )  . warfarin (COUMADIN) 2.5 MG tablet Take 1 tablet (2.5 mg total) by mouth daily at 6 PM. Take one tablet all days except take 2.5110mon MWF (Patient taking differently: Take 2.5-5 mg by mouth daily at 6 PM. Take 2.5 mg tablet every day except take 5 mg on Monday's.)   No facility-administered encounter medications on file as of 07/21/2017.     Functional Status:   In your present state of health, do you have any difficulty performing the following activities: 07/07/2017 06/28/2017  Hearing? Y Tempie DonningVision? Y Y  Difficulty concentrating or making decisions? N N  Walking or climbing stairs? Y Y  Comment - -  Dressing or bathing? Y Y  Doing errands, shopping? Y [place] eating ? Y -  Using the Toilet? Y -  Managing your Medications? Y -  Managing your Finances? Y -  Housekeeping or managing your Housekeeping? Y -  Some recent data might be hidden    Fall/Depression Screening:    Fall Risk  07/07/2017 07/14/2014 06/16/2014  Falls in the past year? No No No  Risk for fall due to : History of fall(s);Impaired balance/gait;Medication side effect - -   PHQ 2/9 Scores 07/07/2017 06/16/2014  PHQ - 2 Score 2 0  PHQ- 9 Score 3 -    Assessment:   THN CM met with Mrs MaRennakernd her husband at her home in the living room.  She is lying in her hospital bed in the living room with her head elevated. Mrs MaLaymoneports she is "doing better" She still remains HOH and generally is noted to ask [name] clarify certain statements.   Home health Mr MaTironeonfirms [facility] until after 08/04/17. The last day HHOT was in the home was on today.  HHPT last day will be on Thursday or Friday per [name] Transportation- Mr MaLaskeeports they are okay related to  transportation but may need assistance after Mrs MaFaxonupcoming outpatient leg surgery related to the DVT in her right leg. [facility] when needed.  There were billing issues but Mr MaHoltseports he [facility].  Right leg- Today the patient's right leg is swollen with +1 pitting edema. CM discussed and educated Mr& Mrs MaDillavoun how +1 pitting edema is determined.  The patient cleaned her right foot during the home visit. The right foot's two smaller toes have drainage that Mrs MaBassoeports "smells" but Cm is unable to detect an odor. There is 2+ pitting edema on the top right foot, 1+ pitting edema at the right ankle but no edema noted on the left foot. Dr CaClaretha CooperN, KaZigmund Daniels scheduled to call with a date and time of procedure per MR Raine. Mrs MaElta Guadeloupeonfirms she prefers to only use Ibuprofen for her right foot pain. She denies pain  at this time. She is stating she has been informed that her procedure will assist with resolution to cellulitis  Social needs -The patient and spouse confirmed they spoke with [facility] SW about medicaid, transportation and community resources.  Mr Boyington reports that Mrs Kindall is now walking and they completed a trial of getting Mrs Elta Guadeloupe in their [place].  Mrs Macfarlane did well with these activity  Medications Mr Nadeau reports that they "are okay with medicines right now" Mrs Marando denies further nausea related to previous medications especially after the discontinuation of her antidepressant. Cm educated them the benefit of eating prior to taking medications to prevent GI concerns. Finishing Keflex (medication list). Frequent voiding related to medications  Follow with primary provider, Dr Hilma Favors went well and they were informed that Mrs Ferdig is "doing fine" on 07/17/17. No further follow up appt. was provided for primary care provider, Per Mr Gabrys Dr Hilma Favors stated this appt. could be scheduled after  the upcoming surgery.  Upcoming appt my eye MD appt. with Dr Jorja Loa is on August 10 2017 at 1030.   Preventive care Mrs Newnam confirms she received the Flu and pneumonia shots while in the hospital.  She has not seen a dentist in a while because she has dentures  DME They have not used the new tub bench at this time.  "She is scared of falling"  They were reminded of the availability of the 24 hr RN line or the [facility] CM during the business day hours  Hypertension THN CM again discuss low BP side effect may be dizziness. Cm encouraged taking the BP prior to taking HTN medications to prevent decreasing the BP lower and further side effects They confirm they have a BP cuff at home   Chronic Kidney Disease "they say it is fine." There is not a nephrologist that has been seen. The last fluid restriction was encouraged at [facility]   A.FIB -Cm reviewed a fib related to Mrs Schul HR and BP. CM reviewed the use of Cardizem. Again Cm reviewed the importance of taking the BP and pulse prior to medication administration. CM wrote down the standard adult BP range, Pulse range, and O2 sat   Plan:   Discussed next contact to be a call versus a home visit in 4 weeks or after surgical procedure. Patient agreed Route note to care team members Continue to collaborate with care team members including [place] staff   [place]. Lavina Hamman, RN, BSN, [place] Care Management [phone]

## 2017-07-22 DIAGNOSIS — T8140XD Infection following a procedure, unspecified, subsequent encounter: Secondary | ICD-10-CM | POA: Diagnosis not present

## 2017-07-22 DIAGNOSIS — L89890 Pressure ulcer of other site, unstageable: Secondary | ICD-10-CM | POA: Diagnosis not present

## 2017-07-22 DIAGNOSIS — I5042 Chronic combined systolic (congestive) and diastolic (congestive) heart failure: Secondary | ICD-10-CM | POA: Diagnosis not present

## 2017-07-22 DIAGNOSIS — L03115 Cellulitis of right lower limb: Secondary | ICD-10-CM | POA: Diagnosis not present

## 2017-07-22 DIAGNOSIS — I110 Hypertensive heart disease with heart failure: Secondary | ICD-10-CM | POA: Diagnosis not present

## 2017-07-22 DIAGNOSIS — B9689 Other specified bacterial agents as the cause of diseases classified elsewhere: Secondary | ICD-10-CM | POA: Diagnosis not present

## 2017-07-24 ENCOUNTER — Other Ambulatory Visit: Payer: Self-pay | Admitting: Pharmacist

## 2017-07-24 NOTE — Patient Outreach (Signed)
[facility]) Care Management  07/24/2017  Charlotte [name] 1940-12-31 [id number]  Patient was referred to [place] Pharmacist by [facility] RN Maudie Mercury for medication assistance and medication reconciliation.   Successful phone outreach---patient's spouse, on [facility] Consent form, answered and stated patient was resting.   He provided HIPAA verification.  Purpose of call explained to patient's spouse.   He reports cost concern was related to IV antibiotics which he reports have been completed---discussed IV antibiotics are typically covered by Medicare Part B with a co-insurance---patient's spouse denies other medication cost concerns.   Given patient not available, discussed [facility] Pharmacist would call back next week to speak with patient and discuss medications in more details.   Plan:  Call back within the next week to attempt to reach patient.   Karrie Meres, PharmD, [place] [phone]

## 2017-07-29 DIAGNOSIS — L03115 Cellulitis of right lower limb: Secondary | ICD-10-CM | POA: Diagnosis not present

## 2017-07-29 DIAGNOSIS — I5042 Chronic combined systolic (congestive) and diastolic (congestive) heart failure: Secondary | ICD-10-CM | POA: Diagnosis not present

## 2017-07-29 DIAGNOSIS — I110 Hypertensive heart disease with heart failure: Secondary | ICD-10-CM | POA: Diagnosis not present

## 2017-07-29 DIAGNOSIS — L89890 Pressure ulcer of other site, unstageable: Secondary | ICD-10-CM | POA: Diagnosis not present

## 2017-07-29 DIAGNOSIS — T8140XD Infection following a procedure, unspecified, subsequent encounter: Secondary | ICD-10-CM | POA: Diagnosis not present

## 2017-07-29 DIAGNOSIS — B9689 Other specified bacterial agents as the cause of diseases classified elsewhere: Secondary | ICD-10-CM | POA: Diagnosis not present

## 2017-07-30 ENCOUNTER — Other Ambulatory Visit: Payer: Self-pay | Admitting: Pharmacist

## 2017-07-30 DIAGNOSIS — L03115 Cellulitis of right lower limb: Secondary | ICD-10-CM | POA: Diagnosis not present

## 2017-07-30 DIAGNOSIS — I5042 Chronic combined systolic (congestive) and diastolic (congestive) heart failure: Secondary | ICD-10-CM | POA: Diagnosis not present

## 2017-07-30 DIAGNOSIS — L89890 Pressure ulcer of other site, unstageable: Secondary | ICD-10-CM | POA: Diagnosis not present

## 2017-07-30 DIAGNOSIS — B9689 Other specified bacterial agents as the cause of diseases classified elsewhere: Secondary | ICD-10-CM | POA: Diagnosis not present

## 2017-07-30 DIAGNOSIS — I110 Hypertensive heart disease with heart failure: Secondary | ICD-10-CM | POA: Diagnosis not present

## 2017-07-30 DIAGNOSIS — T8140XD Infection following a procedure, unspecified, subsequent encounter: Secondary | ICD-10-CM | POA: Diagnosis not present

## 2017-07-30 NOTE — Patient Outreach (Signed)
[facility]) Care Management  07/30/2017  Charlotte [name] 03-01-1941 [id number]  Unsuccessful phone outreach to patient. Gentleman answered phone and reports patient sleeping at time of call.   No PHI exchanged.   Plan:  Will make another other outreach attempt next week.   Karrie Meres, PharmD, [place] [phone]

## 2017-08-01 DIAGNOSIS — I110 Hypertensive heart disease with heart failure: Secondary | ICD-10-CM | POA: Diagnosis not present

## 2017-08-01 DIAGNOSIS — I739 Peripheral vascular disease, unspecified: Secondary | ICD-10-CM | POA: Diagnosis not present

## 2017-08-01 DIAGNOSIS — L03115 Cellulitis of right lower limb: Secondary | ICD-10-CM | POA: Diagnosis not present

## 2017-08-01 DIAGNOSIS — B9689 Other specified bacterial agents as the cause of diseases classified elsewhere: Secondary | ICD-10-CM | POA: Diagnosis not present

## 2017-08-01 DIAGNOSIS — M86171 Other acute osteomyelitis, right ankle and foot: Secondary | ICD-10-CM | POA: Diagnosis not present

## 2017-08-01 DIAGNOSIS — L89890 Pressure ulcer of other site, unstageable: Secondary | ICD-10-CM | POA: Diagnosis not present

## 2017-08-03 ENCOUNTER — Other Ambulatory Visit: Payer: Self-pay | Admitting: Pharmacist

## 2017-08-03 DIAGNOSIS — I110 Hypertensive heart disease with heart failure: Secondary | ICD-10-CM | POA: Diagnosis not present

## 2017-08-03 DIAGNOSIS — B9689 Other specified bacterial agents as the cause of diseases classified elsewhere: Secondary | ICD-10-CM | POA: Diagnosis not present

## 2017-08-03 DIAGNOSIS — L03115 Cellulitis of right lower limb: Secondary | ICD-10-CM | POA: Diagnosis not present

## 2017-08-03 DIAGNOSIS — I739 Peripheral vascular disease, unspecified: Secondary | ICD-10-CM | POA: Diagnosis not present

## 2017-08-03 DIAGNOSIS — L89890 Pressure ulcer of other site, unstageable: Secondary | ICD-10-CM | POA: Diagnosis not present

## 2017-08-03 DIAGNOSIS — M86171 Other acute osteomyelitis, right ankle and foot: Secondary | ICD-10-CM | POA: Diagnosis not present

## 2017-08-03 NOTE — Patient Outreach (Signed)
[place]) Care Management  08/03/2017  AYAT DRENNING 09-May-1941 [id number]  Unsuccessful outreach attempt to patient to review medications.  Gentleman answered call and reported patient not available as she had company over.   No PHI exchanged.    Plan:  Will make final phone outreach attempt in the next week.   Karrie Meres, PharmD, [place] [phone]

## 2017-08-05 ENCOUNTER — Other Ambulatory Visit: Payer: Self-pay

## 2017-08-05 ENCOUNTER — Encounter: Payer: Self-pay | Admitting: Pharmacist

## 2017-08-05 ENCOUNTER — Other Ambulatory Visit: Payer: Self-pay | Admitting: Pharmacist

## 2017-08-05 NOTE — Patient Outreach (Signed)
[facility]) Care Management  08/05/2017  Charlotte [name] 02-13-1941 [id number]  Fourth unsuccessful phone outreach attempt to patient.  No answer, HIPAA compliant message left requesting return call.   Plan:  If no return call, will send outreach letter.  If no response from patient within 10 business days, will close patient's pharmacy episode.   Karrie Meres, PharmD, [place] [phone]

## 2017-08-06 ENCOUNTER — Ambulatory Visit (INDEPENDENT_AMBULATORY_CARE_PROVIDER_SITE_OTHER): Payer: Medicare Other | Admitting: *Deleted

## 2017-08-06 ENCOUNTER — Telehealth: Payer: Self-pay | Admitting: *Deleted

## 2017-08-06 DIAGNOSIS — M86171 Other acute osteomyelitis, right ankle and foot: Secondary | ICD-10-CM | POA: Diagnosis not present

## 2017-08-06 DIAGNOSIS — I482 Chronic atrial fibrillation, unspecified: Secondary | ICD-10-CM

## 2017-08-06 DIAGNOSIS — I110 Hypertensive heart disease with heart failure: Secondary | ICD-10-CM | POA: Diagnosis not present

## 2017-08-06 DIAGNOSIS — L89890 Pressure ulcer of other site, unstageable: Secondary | ICD-10-CM | POA: Diagnosis not present

## 2017-08-06 DIAGNOSIS — I739 Peripheral vascular disease, unspecified: Secondary | ICD-10-CM | POA: Diagnosis not present

## 2017-08-06 DIAGNOSIS — Z5181 Encounter for therapeutic drug level monitoring: Secondary | ICD-10-CM

## 2017-08-06 DIAGNOSIS — L03115 Cellulitis of right lower limb: Secondary | ICD-10-CM | POA: Diagnosis not present

## 2017-08-06 DIAGNOSIS — B9689 Other specified bacterial agents as the cause of diseases classified elsewhere: Secondary | ICD-10-CM | POA: Diagnosis not present

## 2017-08-06 LAB — POCT INR: INR: 1.4

## 2017-08-06 NOTE — Telephone Encounter (Signed)
Done.  See coumadin note. 

## 2017-08-06 NOTE — Patient Instructions (Signed)
Take coumadin 5mg  tonight and tomorrow night then stop for surgery on 08/12/17  Resume coumadin night of surgery if OK with MD taking 2.5mg  daily except 5mg  on Mondays  Instructions given to [place].  Recheck INR 08/18/17

## 2017-08-06 NOTE — Telephone Encounter (Signed)
1.4   wanted to also let you know she is to stop blood thinner Saturday for surgery.

## 2017-08-10 DIAGNOSIS — L89890 Pressure ulcer of other site, unstageable: Secondary | ICD-10-CM | POA: Diagnosis not present

## 2017-08-10 DIAGNOSIS — L03115 Cellulitis of right lower limb: Secondary | ICD-10-CM | POA: Diagnosis not present

## 2017-08-10 DIAGNOSIS — I110 Hypertensive heart disease with heart failure: Secondary | ICD-10-CM | POA: Diagnosis not present

## 2017-08-10 DIAGNOSIS — H25811 Combined forms of age-related cataract, right eye: Secondary | ICD-10-CM | POA: Diagnosis not present

## 2017-08-10 DIAGNOSIS — M86171 Other acute osteomyelitis, right ankle and foot: Secondary | ICD-10-CM | POA: Diagnosis not present

## 2017-08-10 DIAGNOSIS — B9689 Other specified bacterial agents as the cause of diseases classified elsewhere: Secondary | ICD-10-CM | POA: Diagnosis not present

## 2017-08-10 DIAGNOSIS — H25813 Combined forms of age-related cataract, bilateral: Secondary | ICD-10-CM | POA: Diagnosis not present

## 2017-08-10 DIAGNOSIS — I739 Peripheral vascular disease, unspecified: Secondary | ICD-10-CM | POA: Diagnosis not present

## 2017-08-13 ENCOUNTER — Telehealth: Payer: Self-pay | Admitting: *Deleted

## 2017-08-13 DIAGNOSIS — M86171 Other acute osteomyelitis, right ankle and foot: Secondary | ICD-10-CM | POA: Diagnosis not present

## 2017-08-13 DIAGNOSIS — L89890 Pressure ulcer of other site, unstageable: Secondary | ICD-10-CM | POA: Diagnosis not present

## 2017-08-13 DIAGNOSIS — L03115 Cellulitis of right lower limb: Secondary | ICD-10-CM | POA: Diagnosis not present

## 2017-08-13 DIAGNOSIS — B9689 Other specified bacterial agents as the cause of diseases classified elsewhere: Secondary | ICD-10-CM | POA: Diagnosis not present

## 2017-08-13 DIAGNOSIS — I110 Hypertensive heart disease with heart failure: Secondary | ICD-10-CM | POA: Diagnosis not present

## 2017-08-13 DIAGNOSIS — I739 Peripheral vascular disease, unspecified: Secondary | ICD-10-CM | POA: Diagnosis not present

## 2017-08-13 NOTE — Patient Outreach (Signed)
[facility]) Care Management  08/13/2017  LARETHA LUEPKE [birth year] [id number]   Care coordination/medication assistance request from family this week   CM received an - mail from Mrs Mcauliffe' son Stormy Card on 08/10/17 - "My mom Charlotte [name] went to the eye Dr today for an orientation about her cataracts and her medicine for her eyes is going to be over $500.00 at [facility]. That is after the Ins has paid. Is there any way you can see if we could get some help with that?"  Cm made e-mail contact with pharmacist, Lennette Bihari on 08/10/17 08/10/17 CM updated son and provided contact number for pharmacist after noting in [place] that pharmacist has made 3-4 call attempts to Mrs Seiler with messages left but no return call plus sent an unsuccessful out reach letter  08/12/17 CM sent another e mail message to follow up with son about the medication and received a response of- "We were able to get generic and that helped out a lot" plus requesting again the pharmacist [facility]) number.  08/13/17 CM sent the [facility] pharmacist number via e-mail again  to son  CM updated [facility] pharmacist on e-mail interaction and conclusion of medication assistance    Plans to follow up with Mrs Sanford in 1-2 weeks for telephonic assessment of any further needs  [name] L. Lavina Hamman, RN, BSN, [place] Care Management [phone]

## 2017-08-14 DIAGNOSIS — I110 Hypertensive heart disease with heart failure: Secondary | ICD-10-CM | POA: Diagnosis not present

## 2017-08-14 DIAGNOSIS — L89890 Pressure ulcer of other site, unstageable: Secondary | ICD-10-CM | POA: Diagnosis not present

## 2017-08-14 DIAGNOSIS — B9689 Other specified bacterial agents as the cause of diseases classified elsewhere: Secondary | ICD-10-CM | POA: Diagnosis not present

## 2017-08-14 DIAGNOSIS — I739 Peripheral vascular disease, unspecified: Secondary | ICD-10-CM | POA: Diagnosis not present

## 2017-08-14 DIAGNOSIS — M86171 Other acute osteomyelitis, right ankle and foot: Secondary | ICD-10-CM | POA: Diagnosis not present

## 2017-08-14 DIAGNOSIS — L03115 Cellulitis of right lower limb: Secondary | ICD-10-CM | POA: Diagnosis not present

## 2017-08-17 DIAGNOSIS — I110 Hypertensive heart disease with heart failure: Secondary | ICD-10-CM | POA: Diagnosis not present

## 2017-08-17 DIAGNOSIS — L89890 Pressure ulcer of other site, unstageable: Secondary | ICD-10-CM | POA: Diagnosis not present

## 2017-08-17 DIAGNOSIS — I739 Peripheral vascular disease, unspecified: Secondary | ICD-10-CM | POA: Diagnosis not present

## 2017-08-17 DIAGNOSIS — B9689 Other specified bacterial agents as the cause of diseases classified elsewhere: Secondary | ICD-10-CM | POA: Diagnosis not present

## 2017-08-17 DIAGNOSIS — L03115 Cellulitis of right lower limb: Secondary | ICD-10-CM | POA: Diagnosis not present

## 2017-08-17 DIAGNOSIS — M86171 Other acute osteomyelitis, right ankle and foot: Secondary | ICD-10-CM | POA: Diagnosis not present

## 2017-08-18 ENCOUNTER — Other Ambulatory Visit: Payer: Self-pay | Admitting: *Deleted

## 2017-08-18 ENCOUNTER — Telehealth: Payer: Self-pay | Admitting: *Deleted

## 2017-08-18 DIAGNOSIS — B9689 Other specified bacterial agents as the cause of diseases classified elsewhere: Secondary | ICD-10-CM | POA: Diagnosis not present

## 2017-08-18 DIAGNOSIS — I110 Hypertensive heart disease with heart failure: Secondary | ICD-10-CM | POA: Diagnosis not present

## 2017-08-18 DIAGNOSIS — I739 Peripheral vascular disease, unspecified: Secondary | ICD-10-CM | POA: Diagnosis not present

## 2017-08-18 DIAGNOSIS — L89890 Pressure ulcer of other site, unstageable: Secondary | ICD-10-CM | POA: Diagnosis not present

## 2017-08-18 DIAGNOSIS — M86171 Other acute osteomyelitis, right ankle and foot: Secondary | ICD-10-CM | POA: Diagnosis not present

## 2017-08-18 DIAGNOSIS — L03115 Cellulitis of right lower limb: Secondary | ICD-10-CM | POA: Diagnosis not present

## 2017-08-18 NOTE — Patient Outreach (Addendum)
Charlotte [name]) Care Management   08/18/2017  Charlotte [name] [birth year] [id number]  Charlotte [name] is an 77 y.o. 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 She was referred to [place] by  Marin Comment of [facility] for multiple admissions over the last month along with multiple ED visits on 07/06/17   Subjective: see notes below  Objective:   BP 110/70   Pulse 85   Temp 98.2 F (36.8 C) (Oral)   Resp 20   Ht 1.829 m (6')   Wt 148 lb 8 oz (67.4 kg)   LMP  (LMP Unknown)   SpO2 97%   BMI 20.14 kg/m \ Review of Systems  Constitutional: Negative for chills, diaphoresis, fever, malaise/fatigue and weight loss.  HENT: Positive for hearing loss. Negative for congestion, ear discharge, ear pain, nosebleeds, sinus pain, sore throat and tinnitus.   Eyes: Positive for blurred vision. Negative for double vision, photophobia, pain, discharge and redness.  Respiratory: Negative.  Negative for cough, hemoptysis, sputum production, shortness of breath, wheezing and stridor.   Cardiovascular: Positive for leg swelling. Negative for chest pain, palpitations, orthopnea, claudication and PND.  Gastrointestinal: Negative.  Negative for abdominal pain, blood in stool, constipation, diarrhea, heartburn, melena, nausea and vomiting.  Genitourinary: Negative.  Negative for dysuria, flank pain, frequency, hematuria and urgency.  Musculoskeletal: Positive for joint pain. Negative for back pain, falls, myalgias and neck pain.  Skin: Negative.  Negative for itching and rash.  Neurological: Positive for sensory change and weakness. Negative for dizziness, tingling, tremors, speech  change, focal weakness, seizures, loss of consciousness and headaches.  Endo/Heme/Allergies: Negative for environmental allergies and polydipsia. Bruises/bleeds easily.  Psychiatric/Behavioral: Negative.  Negative for depression, hallucinations, memory loss, substance abuse and suicidal ideas. The patient is not nervous/anxious and does not have insomnia.     Physical Exam  Constitutional: She is oriented to person, place, and time. She appears well-developed and well-nourished.  HENT:  Head: Normocephalic and atraumatic.  Eyes: Conjunctivae are normal. Pupils are equal, round, and reactive to light.  Neck: Normal range of motion. Neck supple.  Cardiovascular: Normal rate, regular rhythm, normal heart sounds and intact distal pulses.  Respiratory: Effort normal and breath sounds normal.  GI: Soft. Bowel sounds are normal.  Musculoskeletal: Normal range of motion.  Neurological: She is alert and oriented to person, place, and time.  Skin: Skin is warm and dry.  Psychiatric: She has a normal mood and affect. Her behavior is normal. Judgment and thought content normal.    Encounter Medications:   Outpatient Encounter Medications as of 08/18/2017  Medication Sig Note  . acetaminophen (TYLENOL) 325 MG tablet Take 2 tablets (650 mg total) by mouth every 6 (six) hours as needed for mild pain or fever. (Patient not taking: Reported on 08/06/2017)   . aspirin EC 81 MG EC tablet Take 1 tablet (81 mg total) by mouth daily.   . cephALEXin (KEFLEX) 500 MG capsule Take 500 mg by mouth 3 (three) times daily. 08/13/2017: For 10 days  . diltiazem (CARDIZEM CD) 240 MG 24 hr capsule Take 240 mg by mouth daily.   . furosemide (LASIX) 40 MG tablet Take 1 tablet (40 mg total) by mouth every other day.   . gabapentin (NEURONTIN) 100 MG  capsule Take 100 mg by mouth 3 (three) times daily.     Marland Kitchen HYDROcodone-acetaminophen (NORCO/VICODIN) 5-325 MG tablet Take 1 tablet by mouth every 4 (four) hours as needed for  moderate pain.    . hydroxyurea (HYDREA) 500 MG capsule Take 2 capsules (1,000 mg total) by mouth daily. May take with food to minimize GI side effects.   Marland Kitchen ketorolac (ACULAR) 0.5 % ophthalmic solution Place 1 drop into the right eye See admin instructions. Begin 3 days prior to surgery. Place 1 drop in right eye twice daily. 08/13/2017: Not started yet  . levothyroxine (SYNTHROID, LEVOTHROID) 75 MCG tablet Take 1 tablet (75 mcg total) by mouth daily before breakfast.   . metolazone (ZAROXOLYN) 2.5 MG tablet Take 2.5 mg by mouth every Monday, Wednesday, and Friday.   Marland Kitchen ofloxacin (OCUFLOX) 0.3 % ophthalmic solution Place 1 drop into the right eye See admin instructions. Begin 3 days prior to surgery. Place 1 drop in right eye 4 times daily. Place 1 drop in right eye morning of surgery and continue 1 week after surgery. 08/13/2017: Not started yet  . ondansetron (ZOFRAN ODT) 4 MG disintegrating tablet 78m ODT q4 hours prn nausea/vomit (Patient not taking: Reported on 08/06/2017)   . oxyCODONE (OXY IR/ROXICODONE) 5 MG immediate release tablet Take 1 tablet (5 mg total) by mouth every 6 (six) hours as needed for moderate pain. (Patient not taking: Reported on 08/06/2017)   . pravastatin (PRAVACHOL) 10 MG tablet Take 1 tablet (10 mg total) by mouth daily. (Patient taking differently: Take 5 mg by mouth every evening. )   . warfarin (COUMADIN) 2.5 MG tablet Take 1 tablet (2.5 mg total) by mouth daily at 6 PM. Take one tablet all days except take 2.574mon MWF (Patient taking differently: Take 2.5-5 mg by mouth See admin instructions. Take 5 mg by mouth daily on Monday. Take 2.5 mg by mouth daily on all other days)    No facility-administered encounter medications on file as of 08/18/2017.     Functional Status:   In your present state of health, do you have any difficulty performing the following activities: 07/07/2017 06/28/2017  Hearing? Y Tempie DonningVision? Y Y  Difficulty concentrating or making decisions? N N   Walking or climbing stairs? Y Y  Comment - -  Dressing or bathing? Y Y  Doing errands, shopping? Y Metcalfend eating ? Y -  Using the Toilet? Y -  Managing your Medications? Y -  Managing your Finances? Y -  Housekeeping or managing your Housekeeping? Y -  Some recent data might be hidden    Fall/Depression Screening:    Fall Risk  07/07/2017 07/14/2014 06/16/2014  Falls in the past year? No No No  Risk for fall due to : History of fall(s);Impaired balance/gait;Medication side effect - -   PHQ 2/9 Scores 07/07/2017 06/16/2014  PHQ - 2 Score 2 0  PHQ- 9 Score 3 -    Assessment:   CM met with Charlotte MaDjordjevicnd her husband at her home in her living room She is in her [name] bed in the living room on oxygen at 4 L and is tolerating it well. She is very hard of hearing with need for CM to face her and speak in a loud tone and to have Mr MaJarvisepeat statements or questions at intervals. Mr MaReimanneports her appetite to be very good "she snacks all day" Confirms she does not like  to take in oral supplements.  Cm noted various snacks to include candy within reaching distance of Charlotte [name] bed. Denies constipation or Gi symptoms today  Home health RN comes today at 1600 will change right foot dressing and do INR  Mr Calbert confirmed Charlotte [name] stopped coumadin 3 days ago  Mr Ruffino confirms they have be instructed to not get Charlotte [name]' "foot wet so she has not had a shower yet" HHPT still visits and weighs her  Qd - last weight= 148.5 lbs last HHPT visit was on Monday 08/17/17  HHOT stopped     Vision "I will be glad when I can see" referring to upcoming cataract surgery scheduled on 08/31/17.    Medicines/Pharmacy denies any issues Mr Conradt request [name] pharmacy to speak with son, Charlotte [name] as needed.  The [name] pharmacist has called x 3 and sent a letter to the patient/family. Mr Verge confirms information received Mr Messerschmidt reports on 08/18/17 that there are NO pharmacy  concerns.  CM and Mr Kwan reviewed the instructions on how to give the upcoming eye gtts to prepare for the cataract surgery and post op administration  Eye gtts ketorolac and ocuflox to start 3 days before eye surgery and prednisolone gtts to start after surgery    Foot dressed by home health RN- RN scheduled for a visit today, 115/19  Pain level 8 today   Upcoming appointments 08/19/17 Bypass surgery at 1130 scheduled to arrive per husband at 0930  08/24/17 pre op appt for cataract surgery  08/28/17 start giving eye drops and antibiotics for cataract surgery 08/31/17 cataract surgery  Concerns on 08/18/17 -calls to EMS  Mr Gadbois reports there is no longer an issue with transportation "now that she can get in the jeep" Last outing was to ophthalmology office but reports not knowing when she may need to go to the [name] They report a recent visit from EMS for tremors on the day after Charlotte [name] had her eye appt. Mr Macknight questions if Charlotte [name] may have been cold related the "Cold in the office but she got here and started to shake." He report Charlotte [name] ws checked out by EMS and found to be without an abnormal VS or cbg. Mr Mitschke reports Charlotte Forbis appeared to be fine by the time the EMS staff arrived and assessed her. Mr Roh reports "they said I could offer her fluids here just like they do at the [name]."  He increased fluids and the issues resolved.  Mr Bradway also discussed concerns with "have to pay the other two ambulance bills as it is." Cm referred Mr Langton back to the CHF action plan, the 24 hour nurse line, the MD office and [name] CM business hours for calls to triage Charlotte [name]  Mouth sore EMS checked her also and suggested dehydration as a concern. Cm discussed dehydration possibly related to use of her diuretics and keflex for 10 days started Encouraged drinking for hydration and eating before taking any medications to prevent GI symptoms.   "I want to get these cataracts off so I can see.   If I could I would do it next week."    Plan: Follow up with Charlotte Berrios in 3-4 weeks after her surgeries to see if there are identified needs Encouraged husband to contact CM for prn needs [facility] paramedics program was called to request a referral at  [phone] Mr Rhinehart was given the contact numbers [phone] and [phone].  Cm noted he wrote these numbers down to be able to recognize a return call  A voice message was left requesting a call to Cm or husband since patient  Collaborated/updated [facility] pharmacy staff, K Ruedinger Route note to care team members   [place] Problem Three     Most Recent Value  Care Plan Problem Three  Excess EMS calls/contact -referral to community paramedics program   Role Documenting the Problem Three  Care Management [place] for Problem Three  Active  [facility] Long Term Goal   over the next 31 days a referral to the community paramedics program will be completed and the services will be utilized by the patient/family  [name] Long Term Goal Start Date  08/18/17  Interventions for Problem Three Long Term Goal  referral to [employer], educate patient and family on servcies, consult with community paramedics program staff related referral   [facility] CM Short Term Goal #1   over the next 7 days a referral to the community paramedics program will be completed   [facility] CM Short Term Goal #1 Start Date  08/18/17  Interventions for Short Term Goal #1  calls, messages to community paramedic program, consult/provide referral to the staff, educate patient and family CHF action plan, the 24 hour nurse line, the MD office and [facility] CM business hours for calls to assist with triage      [name] L. Lavina Hamman, RN, BSN, [place] Care Management [phone]

## 2017-08-18 NOTE — Telephone Encounter (Signed)
Pt's surgery was cancelled last week and rescheduled for tomorrow 08/19/17.  Surgeon told pt to stop coumadin on 08/15/17.  INR was not done today since pt is off coumadin.  Order given to Jobe Marker RN [facility] to check INR next week.

## 2017-08-19 ENCOUNTER — Encounter (HOSPITAL_COMMUNITY)
Admission: RE | Disposition: A | Discharge status: Home / Self Care | Payer: Self-pay | Source: Ambulatory Visit | Attending: Vascular Surgery

## 2017-08-19 ENCOUNTER — Telehealth: Payer: Self-pay | Admitting: Physician Assistant

## 2017-08-19 ENCOUNTER — Other Ambulatory Visit: Payer: Self-pay | Admitting: Pharmacist

## 2017-08-19 ENCOUNTER — Ambulatory Visit (HOSPITAL_COMMUNITY)
Admission: RE | Admit: 2017-08-19 | Discharge: 2017-08-19 | Disposition: A | Discharge status: Home / Self Care | Payer: Medicare Other | Source: Ambulatory Visit | Attending: Vascular Surgery | Admitting: Vascular Surgery

## 2017-08-19 DIAGNOSIS — Z5309 Procedure and treatment not carried out because of other contraindication: Secondary | ICD-10-CM | POA: Diagnosis not present

## 2017-08-19 DIAGNOSIS — I739 Peripheral vascular disease, unspecified: Secondary | ICD-10-CM | POA: Diagnosis not present

## 2017-08-19 LAB — POCT I-STAT, CHEM 8
BUN: 41 mg/dL — AB (ref 6–20)
CALCIUM ION: 1.07 mmol/L — AB (ref 1.15–1.40)
CHLORIDE: 93 mmol/L — AB (ref 101–111)
CREATININE: 1.4 mg/dL — AB (ref 0.44–1.00)
Glucose, Bld: 91 mg/dL (ref 65–99)
HEMATOCRIT: 33 % — AB (ref 36.0–46.0)
Hemoglobin: 11.2 g/dL — ABNORMAL LOW (ref 12.0–15.0)
Potassium: 3.7 mmol/L (ref 3.5–5.1)
Sodium: 136 mmol/L (ref 135–145)
TCO2: 30 mmol/L (ref 22–32)

## 2017-08-19 LAB — PROTIME-INR
INR: 2.09
INR: 2.15
Prothrombin Time: 23.3 seconds — ABNORMAL HIGH (ref 11.4–15.2)
Prothrombin Time: 23.8 seconds — ABNORMAL HIGH (ref 11.4–15.2)

## 2017-08-19 SURGERY — INVASIVE LAB ABORTED CASE

## 2017-08-19 MED ORDER — SODIUM CHLORIDE 0.9 % IV SOLN
INTRAVENOUS | Status: DC
  Administered 2017-08-19: 09:00:00 via INTRAVENOUS

## 2017-08-19 SURGICAL SUPPLY — 4 items
KIT PV (KITS) IMPLANT
SYR MEDRAD MARK V 150ML (SYRINGE) IMPLANT
TRANSDUCER W/STOPCOCK (MISCELLANEOUS) IMPLANT
TRAY PV CATH (CUSTOM PROCEDURE TRAY) IMPLANT

## 2017-08-19 NOTE — Progress Notes (Signed)
Pt's repeat INR still high.  Pt's procedure cancelled by Dr [name].  Dr [name] to come speak with pt and family.

## 2017-08-19 NOTE — Progress Notes (Signed)
   Charlotte [name] presented for angiogram today with elevated inr despite coumadin being held for 4 days. We will need to bridge her for longer than 4 days and attempt to get her scheduled in the next couple of weeks. I have discussed with patient and family and they demonstrate good understanding.   Charlotte Ton C. [name], MD [facility] [place] Office: [phone] Pager: [phone]

## 2017-08-19 NOTE — Telephone Encounter (Signed)
[name] with [facility] called after-hours answering service with question about Coumadin. Pt presented for vascular procedure today but it was cancelled due to INR 2.09 with repeat 2.15. Per vasc MD note, "We will need to bridge her for longer than 4 days and attempt to get her scheduled in the next couple of weeks." Home health called because patient/husband realized they did not have instructions listed on what to do with the Coumadin for now.   Per review of Lattie Haw Reid's note (our anticoag clinic) from 08/06/17 the plan was to "resume coumadin night of procedure if OK with MD taking 2.5mg  daily except 5mg  on Mondays."  Given INR of 2.15 today and no procedure, will have her take 2.5mg  today, and then have anticoag team review in AM to provide further recs. This will need to be coordinated with vascular team as well. [name] verbalized understanding and stated she will call the clinic tomorrow to speak with Lisa/Coumadin clinic to personally review. She will also convey this information to the patient.  Dayna Dunn PA-C   "

## 2017-08-19 NOTE — Patient Outreach (Signed)
[facility]) Care Management  08/19/2017  Charlotte [name] Oct 16, 1940 [id number]  Unable to maintain contact with patient after three phone outreach attempts and letter mailed 10 business days ago with no reply.    Plan:  Pharmacy episode will be closed.   [facility] RN Maudie Mercury updated via inbasket.   Karrie Meres, PharmD, [place] [phone]

## 2017-08-20 ENCOUNTER — Telehealth: Payer: Self-pay | Admitting: *Deleted

## 2017-08-20 DIAGNOSIS — I110 Hypertensive heart disease with heart failure: Secondary | ICD-10-CM | POA: Diagnosis not present

## 2017-08-20 DIAGNOSIS — I739 Peripheral vascular disease, unspecified: Secondary | ICD-10-CM | POA: Diagnosis not present

## 2017-08-20 DIAGNOSIS — L89890 Pressure ulcer of other site, unstageable: Secondary | ICD-10-CM | POA: Diagnosis not present

## 2017-08-20 DIAGNOSIS — B9689 Other specified bacterial agents as the cause of diseases classified elsewhere: Secondary | ICD-10-CM | POA: Diagnosis not present

## 2017-08-20 DIAGNOSIS — M86171 Other acute osteomyelitis, right ankle and foot: Secondary | ICD-10-CM | POA: Diagnosis not present

## 2017-08-20 DIAGNOSIS — L03115 Cellulitis of right lower limb: Secondary | ICD-10-CM | POA: Diagnosis not present

## 2017-08-20 NOTE — Telephone Encounter (Signed)
Please have Scheduler notify me when patient is rescheduled for her angiogram so we can hold her coumadin longer. Thanks, Edrick Oh RN Coumadin Clinic

## 2017-08-20 NOTE — Patient Outreach (Signed)
[facility]) Care Management  08/20/2017  Charlotte [name] March 20, 1941 [id number]   Coordination of care   Cm received a call from Lattie Haw from [facility] [place] to receive further information for referral for [name] [name] will be contacted by this services CM noted in Epic that [name] 08/19/17 scheduled procedure with Dr Vella Redhead for bypass was cancelled related to increased INR results  Plans Update [name] and follow up in 3-4 weeks  [name] L. Lavina Hamman, RN, BSN, [place] Care Management [phone]

## 2017-08-24 ENCOUNTER — Encounter (HOSPITAL_COMMUNITY)
Admission: RE | Admit: 2017-08-24 | Discharge: 2017-08-24 | Disposition: A | Discharge status: Home / Self Care | Payer: Medicare Other | Source: Ambulatory Visit | Attending: Ophthalmology | Admitting: Ophthalmology

## 2017-08-24 ENCOUNTER — Encounter (HOSPITAL_COMMUNITY): Payer: Self-pay

## 2017-08-24 DIAGNOSIS — I110 Hypertensive heart disease with heart failure: Secondary | ICD-10-CM | POA: Diagnosis not present

## 2017-08-24 DIAGNOSIS — L03115 Cellulitis of right lower limb: Secondary | ICD-10-CM | POA: Diagnosis not present

## 2017-08-24 DIAGNOSIS — L89890 Pressure ulcer of other site, unstageable: Secondary | ICD-10-CM | POA: Diagnosis not present

## 2017-08-24 DIAGNOSIS — B9689 Other specified bacterial agents as the cause of diseases classified elsewhere: Secondary | ICD-10-CM | POA: Diagnosis not present

## 2017-08-24 DIAGNOSIS — M86171 Other acute osteomyelitis, right ankle and foot: Secondary | ICD-10-CM | POA: Diagnosis not present

## 2017-08-24 DIAGNOSIS — I739 Peripheral vascular disease, unspecified: Secondary | ICD-10-CM | POA: Diagnosis not present

## 2017-08-25 DIAGNOSIS — L89890 Pressure ulcer of other site, unstageable: Secondary | ICD-10-CM | POA: Diagnosis not present

## 2017-08-25 DIAGNOSIS — I739 Peripheral vascular disease, unspecified: Secondary | ICD-10-CM | POA: Diagnosis not present

## 2017-08-25 DIAGNOSIS — L03115 Cellulitis of right lower limb: Secondary | ICD-10-CM | POA: Diagnosis not present

## 2017-08-25 DIAGNOSIS — B9689 Other specified bacterial agents as the cause of diseases classified elsewhere: Secondary | ICD-10-CM | POA: Diagnosis not present

## 2017-08-25 DIAGNOSIS — M86171 Other acute osteomyelitis, right ankle and foot: Secondary | ICD-10-CM | POA: Diagnosis not present

## 2017-08-25 DIAGNOSIS — I110 Hypertensive heart disease with heart failure: Secondary | ICD-10-CM | POA: Diagnosis not present

## 2017-08-26 DIAGNOSIS — M79671 Pain in right foot: Secondary | ICD-10-CM | POA: Diagnosis not present

## 2017-08-26 DIAGNOSIS — M7989 Other specified soft tissue disorders: Secondary | ICD-10-CM | POA: Diagnosis not present

## 2017-08-26 DIAGNOSIS — Z681 Body mass index (BMI) 19 or less, adult: Secondary | ICD-10-CM | POA: Diagnosis not present

## 2017-08-26 DIAGNOSIS — E11622 Type 2 diabetes mellitus with other skin ulcer: Secondary | ICD-10-CM | POA: Diagnosis not present

## 2017-08-28 DIAGNOSIS — I110 Hypertensive heart disease with heart failure: Secondary | ICD-10-CM | POA: Diagnosis not present

## 2017-08-28 DIAGNOSIS — M86171 Other acute osteomyelitis, right ankle and foot: Secondary | ICD-10-CM | POA: Diagnosis not present

## 2017-08-28 DIAGNOSIS — L03115 Cellulitis of right lower limb: Secondary | ICD-10-CM | POA: Diagnosis not present

## 2017-08-28 DIAGNOSIS — L89890 Pressure ulcer of other site, unstageable: Secondary | ICD-10-CM | POA: Diagnosis not present

## 2017-08-28 DIAGNOSIS — I739 Peripheral vascular disease, unspecified: Secondary | ICD-10-CM | POA: Diagnosis not present

## 2017-08-28 DIAGNOSIS — B9689 Other specified bacterial agents as the cause of diseases classified elsewhere: Secondary | ICD-10-CM | POA: Diagnosis not present

## 2017-08-31 ENCOUNTER — Ambulatory Visit (HOSPITAL_COMMUNITY): Payer: Medicare Other | Admitting: Anesthesiology

## 2017-08-31 ENCOUNTER — Encounter (HOSPITAL_COMMUNITY)
Admission: RE | Disposition: A | Discharge status: Home / Self Care | Payer: Self-pay | Source: Ambulatory Visit | Attending: Ophthalmology

## 2017-08-31 ENCOUNTER — Encounter (HOSPITAL_COMMUNITY): Payer: Self-pay

## 2017-08-31 ENCOUNTER — Ambulatory Visit (HOSPITAL_COMMUNITY)
Admission: RE | Admit: 2017-08-31 | Discharge: 2017-08-31 | Disposition: A | Discharge status: Home / Self Care | Payer: Medicare Other | Source: Ambulatory Visit | Attending: Ophthalmology | Admitting: Ophthalmology

## 2017-08-31 DIAGNOSIS — K219 Gastro-esophageal reflux disease without esophagitis: Secondary | ICD-10-CM | POA: Insufficient documentation

## 2017-08-31 DIAGNOSIS — Z7982 Long term (current) use of aspirin: Secondary | ICD-10-CM | POA: Insufficient documentation

## 2017-08-31 DIAGNOSIS — J449 Chronic obstructive pulmonary disease, unspecified: Secondary | ICD-10-CM | POA: Insufficient documentation

## 2017-08-31 DIAGNOSIS — J90 Pleural effusion, not elsewhere classified: Secondary | ICD-10-CM | POA: Insufficient documentation

## 2017-08-31 DIAGNOSIS — H25811 Combined forms of age-related cataract, right eye: Secondary | ICD-10-CM | POA: Diagnosis not present

## 2017-08-31 DIAGNOSIS — D649 Anemia, unspecified: Secondary | ICD-10-CM | POA: Diagnosis not present

## 2017-08-31 DIAGNOSIS — E039 Hypothyroidism, unspecified: Secondary | ICD-10-CM | POA: Insufficient documentation

## 2017-08-31 DIAGNOSIS — I10 Essential (primary) hypertension: Secondary | ICD-10-CM | POA: Diagnosis not present

## 2017-08-31 DIAGNOSIS — I4891 Unspecified atrial fibrillation: Secondary | ICD-10-CM | POA: Insufficient documentation

## 2017-08-31 DIAGNOSIS — I739 Peripheral vascular disease, unspecified: Secondary | ICD-10-CM | POA: Insufficient documentation

## 2017-08-31 DIAGNOSIS — Z79899 Other long term (current) drug therapy: Secondary | ICD-10-CM | POA: Insufficient documentation

## 2017-08-31 DIAGNOSIS — Z7901 Long term (current) use of anticoagulants: Secondary | ICD-10-CM | POA: Insufficient documentation

## 2017-08-31 DIAGNOSIS — H2511 Age-related nuclear cataract, right eye: Secondary | ICD-10-CM | POA: Diagnosis not present

## 2017-08-31 HISTORY — PX: CATARACT EXTRACTION W/PHACO: SHX586

## 2017-08-31 SURGERY — PHACOEMULSIFICATION, CATARACT, WITH IOL INSERTION
Anesthesia: Monitor Anesthesia Care | Site: Eye | Laterality: Right

## 2017-08-31 MED ORDER — LIDOCAINE HCL 3.5 % OP GEL
1.0000 "application " | Freq: Once | OPHTHALMIC | Status: AC
  Administered 2017-08-31: 1 via OPHTHALMIC

## 2017-08-31 MED ORDER — POVIDONE-IODINE 5 % OP SOLN
OPHTHALMIC | Status: DC | PRN
  Administered 2017-08-31: 1 via OPHTHALMIC

## 2017-08-31 MED ORDER — BSS IO SOLN
INTRAOCULAR | Status: DC | PRN
  Administered 2017-08-31: 15 mL

## 2017-08-31 MED ORDER — TETRACAINE HCL 0.5 % OP SOLN
1.0000 [drp] | OPHTHALMIC | Status: AC
  Administered 2017-08-31 (×3): 1 [drp] via OPHTHALMIC

## 2017-08-31 MED ORDER — ONDANSETRON 4 MG PO TBDP
ORAL_TABLET | ORAL | Status: AC
  Filled 2017-08-31: qty 1

## 2017-08-31 MED ORDER — BSS IO SOLN
INTRAOCULAR | Status: DC | PRN
  Administered 2017-08-31: 500 mL

## 2017-08-31 MED ORDER — NEOMYCIN-POLYMYXIN-DEXAMETH 3.5-10000-0.1 OP SUSP
OPHTHALMIC | Status: DC | PRN
  Administered 2017-08-31: 2 [drp] via OPHTHALMIC

## 2017-08-31 MED ORDER — MIDAZOLAM HCL 2 MG/2ML IJ SOLN
1.0000 mg | Freq: Once | INTRAMUSCULAR | Status: AC | PRN
  Administered 2017-08-31: 1 mg via INTRAVENOUS

## 2017-08-31 MED ORDER — LIDOCAINE HCL (PF) 1 % IJ SOLN
INTRAMUSCULAR | Status: DC | PRN
  Administered 2017-08-31: .8 mL

## 2017-08-31 MED ORDER — MIDAZOLAM HCL 2 MG/2ML IJ SOLN
INTRAMUSCULAR | Status: AC
  Filled 2017-08-31: qty 2

## 2017-08-31 MED ORDER — PHENYLEPHRINE HCL 2.5 % OP SOLN
1.0000 [drp] | OPHTHALMIC | Status: AC
  Administered 2017-08-31 (×3): 1 [drp] via OPHTHALMIC

## 2017-08-31 MED ORDER — CYCLOPENTOLATE-PHENYLEPHRINE 0.2-1 % OP SOLN
1.0000 [drp] | OPHTHALMIC | Status: AC
  Administered 2017-08-31 (×3): 1 [drp] via OPHTHALMIC

## 2017-08-31 MED ORDER — PROVISC 10 MG/ML IO SOLN
INTRAOCULAR | Status: DC | PRN
  Administered 2017-08-31: 0.85 mL via INTRAOCULAR

## 2017-08-31 MED ORDER — LACTATED RINGERS IV SOLN
INTRAVENOUS | Status: DC
  Administered 2017-08-31: 09:00:00 via INTRAVENOUS

## 2017-08-31 MED ORDER — ONDANSETRON 4 MG PO TBDP
4.0000 mg | ORAL_TABLET | Freq: Once | ORAL | Status: AC
  Administered 2017-08-31: 4 mg via ORAL

## 2017-08-31 SURGICAL SUPPLY — 12 items
CLOTH BEACON ORANGE TIMEOUT ST (SAFETY) ×2 IMPLANT
EYE SHIELD UNIVERSAL CLEAR (GAUZE/BANDAGES/DRESSINGS) ×2 IMPLANT
GLOVE BIOGEL PI IND STRL 6.5 (GLOVE) ×1 IMPLANT
GLOVE BIOGEL PI IND STRL 7.0 (GLOVE) ×1 IMPLANT
GLOVE BIOGEL PI INDICATOR 6.5 (GLOVE) ×1
GLOVE BIOGEL PI INDICATOR 7.0 (GLOVE) ×1
LENS ALC ACRYL/TECN (Ophthalmic Related) ×2 IMPLANT
PAD ARMBOARD 7.5X6 YLW CONV (MISCELLANEOUS) ×2 IMPLANT
SYRINGE LUER LOK 1CC (MISCELLANEOUS) ×2 IMPLANT
TAPE SURG TRANSPORE 1 IN (GAUZE/BANDAGES/DRESSINGS) ×1 IMPLANT
TAPE SURGICAL TRANSPORE 1 IN (GAUZE/BANDAGES/DRESSINGS) ×1
WATER STERILE IRR 250ML POUR (IV SOLUTION) ×2 IMPLANT

## 2017-08-31 NOTE — Op Note (Signed)
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  Assistants: None  Anesthesia: Topical with MAC  Indications: Painless, progressive loss of vision with compromise of daily activities.  Surgery: Cataract Extraction with Intraocular lens Implant Right Eye  Discription: The patient had dilating drops and viscous lidocaine placed into the Right eye in the pre-op holding area. After transfer to the operating room, a time out was performed. The patient was then prepped and draped. Beginning with a 47m blade a paracentesis port was made at the surgeon's 2 o'clock position. The anterior chamber was then filled with 1% non-preserved lidocaine. This was followed by filling the anterior chamber with Provisc.  A 2.471mkeratome blade was used to make a clear corneal incision at the temporal limbus.  A bent cystatome needle was used to create a continuous tear capsulotomy. Hydrodissection was performed with balanced salt solution on a Fine canula. The lens nucleus was then removed using the phacoemulsification handpiece. Residual cortex was removed with the I&A handpiece. The anterior chamber and capsular bag were refilled with Provisc. A posterior chamber intraocular lens was placed into the capsular bag with it's injector. The implant was positioned with the Kuglan hook. The Provisc was then removed from the anterior chamber and capsular bag with the I&A handpiece. Stromal hydration of the main incision and paracentesis port was performed with BSS on a Fine canula. The wounds were tested for leak which was negative. The patient tolerated the procedure well. There were no operative complications. The patient was then transferred to the recovery room in stable condition.  Complications: None  Specimen: None  EBL: None  Prosthetic device: Abbott Technis, PCB00, power  17.0, SN [id number]

## 2017-08-31 NOTE — Discharge Instructions (Signed)

## 2017-08-31 NOTE — H&P (Signed)
I have reviewed the H&P, the patient was re-examined, and I have identified no interval changes in medical condition and plan of care since the history and physical of record  

## 2017-08-31 NOTE — Transfer of Care (Signed)
Immediate Anesthesia Transfer of Care Note  Patient: Charlotte [name]  Procedure(s) Performed: CATARACT EXTRACTION PHACO AND INTRAOCULAR LENS PLACEMENT RIGHT EYE (Right Eye)  Patient Location: Short Stay  Anesthesia Type:MAC  Level of Consciousness: awake, alert , oriented and patient cooperative  Airway & Oxygen Therapy: Patient Spontanous Breathing and Patient connected to nasal cannula oxygen  Post-op Assessment: Report given to RN and Post -op Vital signs reviewed and stable  Post vital signs: Reviewed and stable  Last Vitals:  Vitals:   08/31/17 0915 08/31/17 0920  BP: 102/63   Pulse:    Resp: 20 20  Temp:    SpO2: 100% 100%    Last Pain:  Vitals:   08/31/17 0904  TempSrc: Oral  PainSc: 7          Complications: No apparent anesthesia complications

## 2017-08-31 NOTE — Anesthesia Procedure Notes (Signed)
Procedure Name: MAC Date/Time: 08/31/2017 9:33 AM Performed by: Andree Elk Morgon Pamer A, CRNA Pre-anesthesia Checklist: Patient identified, Timeout performed, Emergency Drugs available, Suction available and Patient being monitored Oxygen Delivery Method: Nasal cannula

## 2017-08-31 NOTE — Anesthesia Preprocedure Evaluation (Signed)
Anesthesia Evaluation    Airway Mallampati: I  TM Distance: >3 FB     Dental  (+) Teeth Intact   Pulmonary shortness of breath (pleural effusion), COPD,  COMPARISON:  Single-view of the chest 06/09/2017.  FINDINGS: Moderate to moderately large pleural effusions and basilar airspace disease, worse on the left, are unchanged. No pneumothorax. Left PICC present on the prior study has been removed. No acute bony Abnormality.   (Nov 2018)    + decreased breath sounds (left side)      Cardiovascular hypertension, Pt. on medications + Peripheral Vascular Disease and + Orthopnea   Rhythm:Irregular Rate:Normal  - The patient was in atrial fibrillation. Normal LV size with EF   60-65%.   (2018)   Neuro/Psych    GI/Hepatic GERD  Medicated and Controlled,  Endo/Other  Hypothyroidism   Renal/GU CRFRenal diseaseResults for Charlotte [name], Charlotte [name] (MRN [id number]) as of 08/31/2017 09:11  08/19/2017 09:37 Sodium: 136 Potassium: 3.7 Chloride: 93 (L) Glucose: 91 BUN: 41 (H) Creatinine: 1.40 (H)      Musculoskeletal   Abdominal   Peds  Hematology  (+) anemia ,   Anesthesia Other Findings   Reproductive/Obstetrics                             Anesthesia Physical Anesthesia Plan  ASA: IV  Anesthesia Plan: MAC   Post-op Pain Management:    Induction:   PONV Risk Score and Plan:   Airway Management Planned:   Additional Equipment:   Intra-op Plan:   Post-operative Plan:   Informed Consent: I have reviewed the patients History and Physical, chart, labs and discussed the procedure including the risks, benefits and alternatives for the proposed anesthesia with the patient or authorized representative who has indicated his/her understanding and acceptance.   Dental advisory given  Plan Discussed with: CRNA  Anesthesia Plan Comments:         Anesthesia Quick Evaluation

## 2017-08-31 NOTE — Anesthesia Postprocedure Evaluation (Signed)
Anesthesia Post Note  Patient: Charlotte [name]  Procedure(s) Performed: CATARACT EXTRACTION PHACO AND INTRAOCULAR LENS PLACEMENT RIGHT EYE (Right Eye)  Patient location during evaluation: Short Stay Anesthesia Type: MAC Level of consciousness: awake and alert, oriented and patient cooperative Pain management: pain level controlled Vital Signs Assessment: post-procedure vital signs reviewed and stable Respiratory status: spontaneous breathing Cardiovascular status: stable Postop Assessment: no apparent nausea or vomiting Anesthetic complications: no     Last Vitals:  Vitals:   08/31/17 0915 08/31/17 0920  BP: 102/63   Pulse:    Resp: 20 20  Temp:    SpO2: 100% 100%    Last Pain:  Vitals:   08/31/17 0904  TempSrc: Oral  PainSc: 7                  Aryanah Enslow A

## 2017-09-01 ENCOUNTER — Encounter (HOSPITAL_COMMUNITY): Payer: Self-pay | Admitting: Ophthalmology

## 2017-09-02 ENCOUNTER — Ambulatory Visit (INDEPENDENT_AMBULATORY_CARE_PROVIDER_SITE_OTHER): Payer: Medicare Other | Admitting: *Deleted

## 2017-09-02 DIAGNOSIS — I739 Peripheral vascular disease, unspecified: Secondary | ICD-10-CM | POA: Diagnosis not present

## 2017-09-02 DIAGNOSIS — L89890 Pressure ulcer of other site, unstageable: Secondary | ICD-10-CM | POA: Diagnosis not present

## 2017-09-02 DIAGNOSIS — M86171 Other acute osteomyelitis, right ankle and foot: Secondary | ICD-10-CM | POA: Diagnosis not present

## 2017-09-02 DIAGNOSIS — Z5181 Encounter for therapeutic drug level monitoring: Secondary | ICD-10-CM | POA: Diagnosis not present

## 2017-09-02 DIAGNOSIS — I4891 Unspecified atrial fibrillation: Secondary | ICD-10-CM | POA: Diagnosis not present

## 2017-09-02 DIAGNOSIS — L03115 Cellulitis of right lower limb: Secondary | ICD-10-CM | POA: Diagnosis not present

## 2017-09-02 DIAGNOSIS — I482 Chronic atrial fibrillation, unspecified: Secondary | ICD-10-CM

## 2017-09-02 DIAGNOSIS — B9689 Other specified bacterial agents as the cause of diseases classified elsewhere: Secondary | ICD-10-CM | POA: Diagnosis not present

## 2017-09-02 DIAGNOSIS — I110 Hypertensive heart disease with heart failure: Secondary | ICD-10-CM | POA: Diagnosis not present

## 2017-09-02 LAB — POCT INR: INR: 7.1

## 2017-09-02 NOTE — Patient Instructions (Signed)
POC INR 7.1   Requested venipuncture.  RN attempted x 2 and was unable to get blood.  Told nurse to have pt hold coumadin x 3 days (W,Th,F) and take 2.5mg  on Sat/Sun and recheck INR on Monday.  Pt was told is she developed any bleeding or unusual bruising or had a fall to come to the ED.  Order given to [facility] RN [facility].

## 2017-09-03 ENCOUNTER — Telehealth: Payer: Self-pay | Admitting: *Deleted

## 2017-09-03 NOTE — Patient Outreach (Signed)
[facility]) Care Management  09/03/2017  CAROLEENA PAOLINI 1941-01-21 [id number]  Care coordination   CM followed up with Charlotte & Mr Matsushita to check on her progress after her recent outpatient cataract extraction on 08/31/17 "I got the right eye cataract off and go back Monday to see when I can go to get left one off" Voiced improvement in vision Report able to see the tv better  Dr Vella Redhead has still not called to discuss the date to complete the "bypass. I carried her out there but her blood was too thin so he cancelled and wants to reschedule it but he has not called back yet."   Legs "look good so far" The home health nurse continues to visit.  No complaint of legs or foot hurting   HHRN saw Charlotte [name] on 09/02/17 for INR She was told her  "blood was too thin and to hold the blood thinner" Her Husband went to "get turnip greens to help with thickening it up" She reports eating some earlier today and will try to eat some "later in the evening"  Paramedics/intergrated healthcare community program staff (female) come to visit already and scheduled to return on 09/04/17 The treatment plan is to have Mr & Charlotte Beverley call the assigned staff before calling EMS.   Charlotte Mariscal has not been hospitalized since last 06/27/17 - 06/30/17 stay for cellulitis and abscess of her right leg Last ED visit was on 07/03/18 after an emesis, hypoxia episode related to medicine  Denies any medical, DME, medication or appointment needs at this time Aware of upcoming 09/22/17 appointments at [facility] for labs and office visit with the NP at the cancer center Plus the coumadin clinic 1015 10/14/17 appointment  Plan Cm to follow up again in 2 -3 weeks if Mr Kary does not call CM prior to that with needs or updated information on scheduled by pass surgery Remind them also of the availability to call Cm during business hours and 24 hour nurse  Line services Route note to Epic care team members  listed  Johnson L. Lavina Hamman, RN, BSN, [place] Coordinator [phone] week day mobile

## 2017-09-07 DIAGNOSIS — H25812 Combined forms of age-related cataract, left eye: Secondary | ICD-10-CM | POA: Diagnosis not present

## 2017-09-08 ENCOUNTER — Other Ambulatory Visit: Payer: Self-pay | Admitting: *Deleted

## 2017-09-08 ENCOUNTER — Telehealth: Payer: Self-pay | Admitting: *Deleted

## 2017-09-08 NOTE — Telephone Encounter (Signed)
-----   Message from Malen Gauze, RN sent at 09/08/2017  1:10 PM EST ----- Regarding: RE: Hold Coumadin for surgery Yes.  I will arrange and give pt instructions. Thanks ----- Message ----- From: Mena Goes, RN Sent: 09/08/2017  11:14 AM To: Malen Gauze, RN Subject: Hold Coumadin for surgery                      Charlotte [name], Charlotte [name] was originally schedule for an aortogram on 08-19-17 but her INR was too high. We have rescheduled this for Monday 09-21-17 and want her to be off the Coumadin x 1 week this time with Lovenox bridging as needed. Is this plan ok with you?   Thanks, Leota Jacobsen, RN Surgical / Triage Nurse [facility]   [phone]

## 2017-09-08 NOTE — Telephone Encounter (Signed)
Patient's husband is aware that [facility] staff will be giving the information regarding her meds.

## 2017-09-09 ENCOUNTER — Ambulatory Visit (INDEPENDENT_AMBULATORY_CARE_PROVIDER_SITE_OTHER): Payer: Medicare Other | Admitting: *Deleted

## 2017-09-09 DIAGNOSIS — B9689 Other specified bacterial agents as the cause of diseases classified elsewhere: Secondary | ICD-10-CM | POA: Diagnosis not present

## 2017-09-09 DIAGNOSIS — I482 Chronic atrial fibrillation, unspecified: Secondary | ICD-10-CM

## 2017-09-09 DIAGNOSIS — L89890 Pressure ulcer of other site, unstageable: Secondary | ICD-10-CM | POA: Diagnosis not present

## 2017-09-09 DIAGNOSIS — Z5181 Encounter for therapeutic drug level monitoring: Secondary | ICD-10-CM | POA: Diagnosis not present

## 2017-09-09 DIAGNOSIS — I110 Hypertensive heart disease with heart failure: Secondary | ICD-10-CM | POA: Diagnosis not present

## 2017-09-09 DIAGNOSIS — M86171 Other acute osteomyelitis, right ankle and foot: Secondary | ICD-10-CM | POA: Diagnosis not present

## 2017-09-09 DIAGNOSIS — I739 Peripheral vascular disease, unspecified: Secondary | ICD-10-CM | POA: Diagnosis not present

## 2017-09-09 DIAGNOSIS — L03115 Cellulitis of right lower limb: Secondary | ICD-10-CM | POA: Diagnosis not present

## 2017-09-09 LAB — POCT INR: INR: 1.5

## 2017-09-09 NOTE — Patient Instructions (Signed)
Take coumadin 5mg  tonight then resume 2.5mg  daily except 5mg  on Mondays.  Recheck in 1 week.  Scheduled for abdominal aortogram on 2/18.  Will take last dose of coumadin 09/15/17 and restart night of procedure if OK with MD.  CHADS score of 2.  Pt does not need to be bridged with Lovenox per protocol.

## 2017-09-10 ENCOUNTER — Encounter (HOSPITAL_COMMUNITY): Payer: Self-pay

## 2017-09-10 ENCOUNTER — Encounter (HOSPITAL_COMMUNITY)
Admission: RE | Admit: 2017-09-10 | Discharge: 2017-09-10 | Disposition: A | Discharge status: Home / Self Care | Payer: Medicare Other | Source: Ambulatory Visit | Attending: Ophthalmology | Admitting: Ophthalmology

## 2017-09-10 HISTORY — DX: Unspecified hearing loss, unspecified ear: H91.90

## 2017-09-11 DIAGNOSIS — L89890 Pressure ulcer of other site, unstageable: Secondary | ICD-10-CM | POA: Diagnosis not present

## 2017-09-11 DIAGNOSIS — L03115 Cellulitis of right lower limb: Secondary | ICD-10-CM | POA: Diagnosis not present

## 2017-09-11 DIAGNOSIS — B9689 Other specified bacterial agents as the cause of diseases classified elsewhere: Secondary | ICD-10-CM | POA: Diagnosis not present

## 2017-09-11 DIAGNOSIS — I110 Hypertensive heart disease with heart failure: Secondary | ICD-10-CM | POA: Diagnosis not present

## 2017-09-11 DIAGNOSIS — I739 Peripheral vascular disease, unspecified: Secondary | ICD-10-CM | POA: Diagnosis not present

## 2017-09-11 DIAGNOSIS — M86171 Other acute osteomyelitis, right ankle and foot: Secondary | ICD-10-CM | POA: Diagnosis not present

## 2017-09-12 ENCOUNTER — Other Ambulatory Visit: Payer: Self-pay | Admitting: Cardiovascular Disease

## 2017-09-14 ENCOUNTER — Encounter (HOSPITAL_COMMUNITY)
Admission: RE | Disposition: A | Discharge status: Home / Self Care | Payer: Self-pay | Source: Ambulatory Visit | Attending: Ophthalmology

## 2017-09-14 ENCOUNTER — Ambulatory Visit (HOSPITAL_COMMUNITY): Payer: Medicare Other | Admitting: Anesthesiology

## 2017-09-14 ENCOUNTER — Telehealth: Payer: Self-pay | Admitting: *Deleted

## 2017-09-14 ENCOUNTER — Other Ambulatory Visit: Payer: Self-pay

## 2017-09-14 ENCOUNTER — Ambulatory Visit (INDEPENDENT_AMBULATORY_CARE_PROVIDER_SITE_OTHER): Payer: Medicare Other | Admitting: *Deleted

## 2017-09-14 ENCOUNTER — Encounter (HOSPITAL_COMMUNITY): Payer: Self-pay | Admitting: *Deleted

## 2017-09-14 ENCOUNTER — Ambulatory Visit (HOSPITAL_COMMUNITY)
Admission: RE | Admit: 2017-09-14 | Discharge: 2017-09-14 | Disposition: A | Discharge status: Home / Self Care | Payer: Medicare Other | Source: Ambulatory Visit | Attending: Ophthalmology | Admitting: Ophthalmology

## 2017-09-14 DIAGNOSIS — D649 Anemia, unspecified: Secondary | ICD-10-CM | POA: Diagnosis not present

## 2017-09-14 DIAGNOSIS — R0601 Orthopnea: Secondary | ICD-10-CM | POA: Insufficient documentation

## 2017-09-14 DIAGNOSIS — I739 Peripheral vascular disease, unspecified: Secondary | ICD-10-CM | POA: Diagnosis not present

## 2017-09-14 DIAGNOSIS — Z993 Dependence on wheelchair: Secondary | ICD-10-CM | POA: Diagnosis not present

## 2017-09-14 DIAGNOSIS — Z5181 Encounter for therapeutic drug level monitoring: Secondary | ICD-10-CM | POA: Diagnosis not present

## 2017-09-14 DIAGNOSIS — M86171 Other acute osteomyelitis, right ankle and foot: Secondary | ICD-10-CM | POA: Diagnosis not present

## 2017-09-14 DIAGNOSIS — I110 Hypertensive heart disease with heart failure: Secondary | ICD-10-CM | POA: Diagnosis not present

## 2017-09-14 DIAGNOSIS — H2512 Age-related nuclear cataract, left eye: Secondary | ICD-10-CM | POA: Diagnosis not present

## 2017-09-14 DIAGNOSIS — J449 Chronic obstructive pulmonary disease, unspecified: Secondary | ICD-10-CM | POA: Insufficient documentation

## 2017-09-14 DIAGNOSIS — I482 Chronic atrial fibrillation, unspecified: Secondary | ICD-10-CM

## 2017-09-14 DIAGNOSIS — B9689 Other specified bacterial agents as the cause of diseases classified elsewhere: Secondary | ICD-10-CM | POA: Diagnosis not present

## 2017-09-14 DIAGNOSIS — I4891 Unspecified atrial fibrillation: Secondary | ICD-10-CM | POA: Diagnosis not present

## 2017-09-14 DIAGNOSIS — L89890 Pressure ulcer of other site, unstageable: Secondary | ICD-10-CM | POA: Diagnosis not present

## 2017-09-14 DIAGNOSIS — K219 Gastro-esophageal reflux disease without esophagitis: Secondary | ICD-10-CM | POA: Insufficient documentation

## 2017-09-14 DIAGNOSIS — E039 Hypothyroidism, unspecified: Secondary | ICD-10-CM | POA: Insufficient documentation

## 2017-09-14 DIAGNOSIS — L03115 Cellulitis of right lower limb: Secondary | ICD-10-CM | POA: Diagnosis not present

## 2017-09-14 DIAGNOSIS — J90 Pleural effusion, not elsewhere classified: Secondary | ICD-10-CM | POA: Insufficient documentation

## 2017-09-14 DIAGNOSIS — H25812 Combined forms of age-related cataract, left eye: Secondary | ICD-10-CM | POA: Insufficient documentation

## 2017-09-14 HISTORY — PX: CATARACT EXTRACTION W/PHACO: SHX586

## 2017-09-14 LAB — POCT INR: INR: 2

## 2017-09-14 SURGERY — PHACOEMULSIFICATION, CATARACT, WITH IOL INSERTION
Anesthesia: Monitor Anesthesia Care | Site: Eye | Laterality: Left

## 2017-09-14 MED ORDER — METOLAZONE 2.5 MG PO TABS: ORAL_TABLET | ORAL | 3 refills | Status: DC

## 2017-09-14 MED ORDER — NEOMYCIN-POLYMYXIN-DEXAMETH 3.5-10000-0.1 OP SUSP
OPHTHALMIC | Status: DC | PRN
  Administered 2017-09-14: 2 [drp] via OPHTHALMIC

## 2017-09-14 MED ORDER — LIDOCAINE HCL (PF) 1 % IJ SOLN
INTRAMUSCULAR | Status: DC | PRN
  Administered 2017-09-14: .6 mL

## 2017-09-14 MED ORDER — MIDAZOLAM HCL 2 MG/2ML IJ SOLN
1.0000 mg | INTRAMUSCULAR | Status: AC
  Administered 2017-09-14: 2 mg via INTRAVENOUS

## 2017-09-14 MED ORDER — BSS IO SOLN
INTRAOCULAR | Status: DC | PRN
  Administered 2017-09-14: 15 mL

## 2017-09-14 MED ORDER — MIDAZOLAM HCL 2 MG/2ML IJ SOLN
INTRAMUSCULAR | Status: AC
  Filled 2017-09-14: qty 2

## 2017-09-14 MED ORDER — CYCLOPENTOLATE-PHENYLEPHRINE 0.2-1 % OP SOLN
1.0000 [drp] | OPHTHALMIC | Status: AC
  Administered 2017-09-14 (×3): 1 [drp] via OPHTHALMIC

## 2017-09-14 MED ORDER — POVIDONE-IODINE 5 % OP SOLN
OPHTHALMIC | Status: DC | PRN
  Administered 2017-09-14: 1 via OPHTHALMIC

## 2017-09-14 MED ORDER — FENTANYL CITRATE (PF) 100 MCG/2ML IJ SOLN
25.0000 ug | Freq: Once | INTRAMUSCULAR | Status: AC
  Administered 2017-09-14: 25 ug via INTRAVENOUS

## 2017-09-14 MED ORDER — PROVISC 10 MG/ML IO SOLN
INTRAOCULAR | Status: DC | PRN
  Administered 2017-09-14: 0.85 mL via INTRAOCULAR

## 2017-09-14 MED ORDER — LACTATED RINGERS IV SOLN
INTRAVENOUS | Status: DC
  Administered 2017-09-14: 1000 mL via INTRAVENOUS

## 2017-09-14 MED ORDER — EPINEPHRINE PF 1 MG/ML IJ SOLN
INTRAMUSCULAR | Status: AC
  Filled 2017-09-14: qty 1

## 2017-09-14 MED ORDER — PHENYLEPHRINE HCL 2.5 % OP SOLN
1.0000 [drp] | OPHTHALMIC | Status: AC
  Administered 2017-09-14 (×3): 1 [drp] via OPHTHALMIC

## 2017-09-14 MED ORDER — BSS IO SOLN
INTRAOCULAR | Status: DC | PRN
  Administered 2017-09-14: 500 mL

## 2017-09-14 MED ORDER — FENTANYL CITRATE (PF) 100 MCG/2ML IJ SOLN
INTRAMUSCULAR | Status: AC
  Filled 2017-09-14: qty 2

## 2017-09-14 MED ORDER — LIDOCAINE HCL 3.5 % OP GEL
1.0000 "application " | Freq: Once | OPHTHALMIC | Status: AC
  Administered 2017-09-14: 1 via OPHTHALMIC

## 2017-09-14 MED ORDER — TETRACAINE HCL 0.5 % OP SOLN
1.0000 [drp] | OPHTHALMIC | Status: AC
  Administered 2017-09-14 (×3): 1 [drp] via OPHTHALMIC

## 2017-09-14 SURGICAL SUPPLY — 12 items
CLOTH BEACON ORANGE TIMEOUT ST (SAFETY) ×2 IMPLANT
EYE SHIELD UNIVERSAL CLEAR (GAUZE/BANDAGES/DRESSINGS) ×2 IMPLANT
GLOVE BIOGEL PI IND STRL 6.5 (GLOVE) ×1 IMPLANT
GLOVE BIOGEL PI IND STRL 7.0 (GLOVE) ×1 IMPLANT
GLOVE BIOGEL PI INDICATOR 6.5 (GLOVE) ×1
GLOVE BIOGEL PI INDICATOR 7.0 (GLOVE) ×1
LENS ALC ACRYL/TECN (Ophthalmic Related) ×2 IMPLANT
PAD ARMBOARD 7.5X6 YLW CONV (MISCELLANEOUS) ×2 IMPLANT
SYRINGE LUER LOK 1CC (MISCELLANEOUS) ×2 IMPLANT
TAPE SURG TRANSPORE 1 IN (GAUZE/BANDAGES/DRESSINGS) ×1 IMPLANT
TAPE SURGICAL TRANSPORE 1 IN (GAUZE/BANDAGES/DRESSINGS) ×1
WATER STERILE IRR 250ML POUR (IV SOLUTION) ×2 IMPLANT

## 2017-09-14 NOTE — H&P (Signed)
I have reviewed the H&P, the patient was re-examined, and I have identified no interval changes in medical condition and plan of care since the history and physical of record  

## 2017-09-14 NOTE — Op Note (Signed)
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  Assistants: None  Anesthesia: Topical with MAC  Indications: Painless, progressive loss of vision with compromise of daily activities.  Surgery: Cataract Extraction with Intraocular lens Implant Left Eye  Discription: The patient had dilating drops and viscous lidocaine placed into the Left eye in the pre-op holding area. After transfer to the operating room, a time out was performed. The patient was then prepped and draped. Beginning with a 13m blade a paracentesis port was made at the surgeon's 2 o'clock position. The anterior chamber was then filled with 1% non-preserved lidocaine. This was followed by filling the anterior chamber with Provisc.  A 2.473mkeratome blade was used to make a clear corneal incision at the temporal limbus.  A bent cystatome needle was used to create a continuous tear capsulotomy. Hydrodissection was performed with balanced salt solution on a Fine canula. The lens nucleus was then removed using the phacoemulsification handpiece. Residual cortex was removed with the I&A handpiece. The anterior chamber and capsular bag were refilled with Provisc. A posterior chamber intraocular lens was placed into the capsular bag with it's injector. The implant was positioned with the Kuglan hook. The Provisc was then removed from the anterior chamber and capsular bag with the I&A handpiece. Stromal hydration of the main incision and paracentesis port was performed with BSS on a Fine canula. The wounds were tested for leak which was negative. The patient tolerated the procedure well. There were no operative complications. The patient was then transferred to the recovery room in stable condition.  Complications: None  Specimen: None  EBL: None  Prosthetic device: J&J Technis, PCB00, power 18.0, SN  [id number]

## 2017-09-14 NOTE — Patient Instructions (Signed)
Take coumadin 5mg  tonight then stop coumadin for upcoming aortogram on 2/18.  Restart coumadin night of procedure if ok with MD.  Pt does not need lovenox. Reported by Lenna Sciara RN [facility]  Instructions given to pt's husband and he verbalized understanding. HHN to check INR on 09/24/17

## 2017-09-14 NOTE — Telephone Encounter (Signed)
Done.  See coumadin note. 

## 2017-09-14 NOTE — Telephone Encounter (Signed)
Refilled zaroxolyn per pharmacy fax

## 2017-09-14 NOTE — Anesthesia Postprocedure Evaluation (Signed)
Anesthesia Post Note  Patient: Charlotte [name]  Procedure(s) Performed: CATARACT EXTRACTION PHACO AND INTRAOCULAR LENS PLACEMENT LEFT EYE (Left Eye)  Patient location during evaluation: Short Stay Anesthesia Type: MAC Level of consciousness: awake and alert and patient cooperative Pain management: satisfactory to patient Vital Signs Assessment: post-procedure vital signs reviewed and stable Respiratory status: spontaneous breathing Cardiovascular status: stable Postop Assessment: no apparent nausea or vomiting Anesthetic complications: no     Last Vitals:  Vitals:   09/14/17 0920 09/14/17 0925  BP: 112/65 114/64  Pulse:    Resp: 16 (!) 22  Temp:    SpO2: 97% 97%    Last Pain:  Vitals:   09/14/17 0843  TempSrc: Oral  PainSc: 0-No pain                 Saud Bail

## 2017-09-14 NOTE — Discharge Instructions (Signed)

## 2017-09-14 NOTE — Anesthesia Preprocedure Evaluation (Signed)
Anesthesia Evaluation    Airway Mallampati: I  TM Distance: >3 FB     Dental  (+) Teeth Intact   Pulmonary shortness of breath (pleural effusion), COPD,  COMPARISON:  Single-view of the chest 06/09/2017.  FINDINGS: Moderate to moderately large pleural effusions and basilar airspace disease, worse on the left, are unchanged. No pneumothorax. Left PICC present on the prior study has been removed. No acute bony Abnormality.   (Nov 2018)    + decreased breath sounds (left side)      Cardiovascular hypertension, Pt. on medications + Peripheral Vascular Disease, +CHF and + Orthopnea   Rhythm:Irregular Rate:Normal  - The patient was in atrial fibrillation. Normal LV size with EF   60-65%.   (2018)   Neuro/Psych    GI/Hepatic GERD  Medicated and Controlled,  Endo/Other  Hypothyroidism   Renal/GU CRFRenal diseaseResults for Charlotte [name], Charlotte [name] (MRN [id number]) as of 08/31/2017 09:11  08/19/2017 09:37 Sodium: 136 Potassium: 3.7 Chloride: 93 (L) Glucose: 91 BUN: 41 (H) Creatinine: 1.40 (H)      Musculoskeletal   Abdominal   Peds  Hematology  (+) anemia ,   Anesthesia Other Findings   Reproductive/Obstetrics                             Anesthesia Physical Anesthesia Plan  ASA: IV  Anesthesia Plan: MAC   Post-op Pain Management:    Induction:   PONV Risk Score and Plan:   Airway Management Planned:   Additional Equipment:   Intra-op Plan:   Post-operative Plan:   Informed Consent: I have reviewed the patients History and Physical, chart, labs and discussed the procedure including the risks, benefits and alternatives for the proposed anesthesia with the patient or authorized representative who has indicated his/her understanding and acceptance.   Dental advisory given  Plan Discussed with: CRNA  Anesthesia Plan Comments:         Anesthesia Quick Evaluation

## 2017-09-14 NOTE — Anesthesia Procedure Notes (Signed)
Procedure Name: MAC Date/Time: 09/14/2017 9:28 AM Performed by: Vista Deck, CRNA Pre-anesthesia Checklist: Patient identified, Emergency Drugs available, Suction available, Timeout performed and Patient being monitored Patient Re-evaluated:Patient Re-evaluated prior to induction Oxygen Delivery Method: Nasal Cannula

## 2017-09-14 NOTE — Transfer of Care (Signed)
Immediate Anesthesia Transfer of Care Note  Patient: Charlotte [name]  Procedure(s) Performed: CATARACT EXTRACTION PHACO AND INTRAOCULAR LENS PLACEMENT LEFT EYE (Left Eye)  Patient Location: short stay  Anesthesia Type:MAC  Level of Consciousness: awake, alert  and patient cooperative  Airway & Oxygen Therapy: Patient Spontanous Breathing  Post-op Assessment: Report given to RN and Post -op Vital signs reviewed and stable  Post vital signs: Reviewed and stable  Last Vitals:  Vitals:   09/14/17 0920 09/14/17 0925  BP: 112/65 114/64  Pulse:    Resp: 16 (!) 22  Temp:    SpO2: 97% 97%    Last Pain:  Vitals:   09/14/17 0843  TempSrc: Oral  PainSc: 0-No pain      Patients Stated Pain Goal: 7 (25/49/82 6415)  Complications: No apparent anesthesia complications

## 2017-09-14 NOTE — Telephone Encounter (Signed)
INR - 2.0 per [facility] / please call patient's husband with instructions / tg

## 2017-09-15 ENCOUNTER — Encounter (HOSPITAL_COMMUNITY): Payer: Self-pay | Admitting: Ophthalmology

## 2017-09-21 ENCOUNTER — Ambulatory Visit (HOSPITAL_COMMUNITY)
Admission: RE | Admit: 2017-09-21 | Discharge: 2017-09-21 | Disposition: A | Discharge status: Home / Self Care | Payer: Medicare Other | Source: Ambulatory Visit | Attending: Vascular Surgery | Admitting: Vascular Surgery

## 2017-09-21 ENCOUNTER — Ambulatory Visit (HOSPITAL_COMMUNITY)
Admission: RE | Disposition: A | Discharge status: Home / Self Care | Payer: Self-pay | Source: Ambulatory Visit | Attending: Vascular Surgery

## 2017-09-21 DIAGNOSIS — M86671 Other chronic osteomyelitis, right ankle and foot: Secondary | ICD-10-CM | POA: Diagnosis not present

## 2017-09-21 DIAGNOSIS — K219 Gastro-esophageal reflux disease without esophagitis: Secondary | ICD-10-CM | POA: Insufficient documentation

## 2017-09-21 DIAGNOSIS — I739 Peripheral vascular disease, unspecified: Secondary | ICD-10-CM | POA: Diagnosis not present

## 2017-09-21 DIAGNOSIS — Z7901 Long term (current) use of anticoagulants: Secondary | ICD-10-CM | POA: Insufficient documentation

## 2017-09-21 DIAGNOSIS — I482 Chronic atrial fibrillation: Secondary | ICD-10-CM | POA: Diagnosis not present

## 2017-09-21 DIAGNOSIS — E039 Hypothyroidism, unspecified: Secondary | ICD-10-CM | POA: Diagnosis not present

## 2017-09-21 DIAGNOSIS — G629 Polyneuropathy, unspecified: Secondary | ICD-10-CM | POA: Diagnosis not present

## 2017-09-21 DIAGNOSIS — I509 Heart failure, unspecified: Secondary | ICD-10-CM | POA: Insufficient documentation

## 2017-09-21 DIAGNOSIS — H9190 Unspecified hearing loss, unspecified ear: Secondary | ICD-10-CM | POA: Insufficient documentation

## 2017-09-21 DIAGNOSIS — J449 Chronic obstructive pulmonary disease, unspecified: Secondary | ICD-10-CM | POA: Insufficient documentation

## 2017-09-21 DIAGNOSIS — Z7982 Long term (current) use of aspirin: Secondary | ICD-10-CM | POA: Insufficient documentation

## 2017-09-21 DIAGNOSIS — Z8249 Family history of ischemic heart disease and other diseases of the circulatory system: Secondary | ICD-10-CM | POA: Diagnosis not present

## 2017-09-21 DIAGNOSIS — Z86718 Personal history of other venous thrombosis and embolism: Secondary | ICD-10-CM | POA: Insufficient documentation

## 2017-09-21 DIAGNOSIS — Z9981 Dependence on supplemental oxygen: Secondary | ICD-10-CM | POA: Insufficient documentation

## 2017-09-21 DIAGNOSIS — L97519 Non-pressure chronic ulcer of other part of right foot with unspecified severity: Secondary | ICD-10-CM | POA: Insufficient documentation

## 2017-09-21 DIAGNOSIS — F419 Anxiety disorder, unspecified: Secondary | ICD-10-CM | POA: Insufficient documentation

## 2017-09-21 DIAGNOSIS — I110 Hypertensive heart disease with heart failure: Secondary | ICD-10-CM | POA: Diagnosis not present

## 2017-09-21 DIAGNOSIS — E785 Hyperlipidemia, unspecified: Secondary | ICD-10-CM | POA: Insufficient documentation

## 2017-09-21 DIAGNOSIS — M1990 Unspecified osteoarthritis, unspecified site: Secondary | ICD-10-CM | POA: Diagnosis not present

## 2017-09-21 HISTORY — PX: ABDOMINAL AORTOGRAM W/LOWER EXTREMITY: CATH118223

## 2017-09-21 LAB — POCT I-STAT, CHEM 8
BUN: 15 mg/dL (ref 6–20)
CREATININE: 0.9 mg/dL (ref 0.44–1.00)
Calcium, Ion: 1.01 mmol/L — ABNORMAL LOW (ref 1.15–1.40)
Chloride: 103 mmol/L (ref 101–111)
GLUCOSE: 81 mg/dL (ref 65–99)
HEMATOCRIT: 33 % — AB (ref 36.0–46.0)
Hemoglobin: 11.2 g/dL — ABNORMAL LOW (ref 12.0–15.0)
POTASSIUM: 3.6 mmol/L (ref 3.5–5.1)
Sodium: 139 mmol/L (ref 135–145)
TCO2: 27 mmol/L (ref 22–32)

## 2017-09-21 LAB — PROTIME-INR
INR: 1.21
Prothrombin Time: 15.3 seconds — ABNORMAL HIGH (ref 11.4–15.2)

## 2017-09-21 SURGERY — ABDOMINAL AORTOGRAM W/LOWER EXTREMITY
Anesthesia: LOCAL

## 2017-09-21 MED ORDER — SODIUM CHLORIDE 0.9 % IV SOLN
INTRAVENOUS | Status: DC
  Administered 2017-09-21: 08:00:00 via INTRAVENOUS

## 2017-09-21 MED ORDER — LIDOCAINE HCL 1 % IJ SOLN
INTRAMUSCULAR | Status: AC
  Filled 2017-09-21: qty 20

## 2017-09-21 MED ORDER — HEPARIN (PORCINE) IN NACL 2-0.9 UNIT/ML-% IJ SOLN
INTRAMUSCULAR | Status: AC | PRN
  Administered 2017-09-21: 1000 mL via INTRA_ARTERIAL

## 2017-09-21 MED ORDER — HEPARIN (PORCINE) IN NACL 2-0.9 UNIT/ML-% IJ SOLN
INTRAMUSCULAR | Status: AC
  Filled 2017-09-21: qty 1000

## 2017-09-21 MED ORDER — LABETALOL HCL 5 MG/ML IV SOLN: 10.0000 mg | INTRAVENOUS | Status: DC | PRN

## 2017-09-21 MED ORDER — SODIUM CHLORIDE 0.9% FLUSH: 3.0000 mL | Freq: Two times a day (BID) | INTRAVENOUS | Status: DC

## 2017-09-21 MED ORDER — FENTANYL CITRATE (PF) 100 MCG/2ML IJ SOLN
INTRAMUSCULAR | Status: DC | PRN
  Administered 2017-09-21: 25 ug via INTRAVENOUS

## 2017-09-21 MED ORDER — SODIUM CHLORIDE 0.9% FLUSH: 3.0000 mL | INTRAVENOUS | Status: DC | PRN

## 2017-09-21 MED ORDER — LIDOCAINE HCL (PF) 1 % IJ SOLN
INTRAMUSCULAR | Status: DC | PRN
  Administered 2017-09-21: 20 mL via INTRADERMAL

## 2017-09-21 MED ORDER — FENTANYL CITRATE (PF) 100 MCG/2ML IJ SOLN
INTRAMUSCULAR | Status: AC
  Filled 2017-09-21: qty 2

## 2017-09-21 MED ORDER — IODIXANOL 320 MG/ML IV SOLN
INTRAVENOUS | Status: DC | PRN
  Administered 2017-09-21: 140 mL via INTRA_ARTERIAL

## 2017-09-21 MED ORDER — SODIUM CHLORIDE 0.9 % IV SOLN: 250.0000 mL | INTRAVENOUS | Status: DC | PRN

## 2017-09-21 MED ORDER — HYDRALAZINE HCL 20 MG/ML IJ SOLN: 5.0000 mg | INTRAMUSCULAR | Status: DC | PRN

## 2017-09-21 MED ORDER — SODIUM CHLORIDE 0.9 % WEIGHT BASED INFUSION: 1.0000 mL/kg/h | INTRAVENOUS | Status: DC

## 2017-09-21 SURGICAL SUPPLY — 8 items
CATH OMNI FLUSH 5F 65CM (CATHETERS) ×2 IMPLANT
KIT MICROPUNCTURE NIT STIFF (SHEATH) ×2 IMPLANT
KIT PV (KITS) ×2 IMPLANT
SHEATH PINNACLE 5F 10CM (SHEATH) ×2 IMPLANT
SYR MEDRAD MARK V 150ML (SYRINGE) ×2 IMPLANT
TRANSDUCER W/STOPCOCK (MISCELLANEOUS) ×2 IMPLANT
TRAY PV CATH (CUSTOM PROCEDURE TRAY) ×2 IMPLANT
WIRE BENTSON .035X145CM (WIRE) ×2 IMPLANT

## 2017-09-21 NOTE — Op Note (Signed)
    Patient name: Charlotte [name] MRN: [id number] DOB: [birth year] Sex: female  09/21/2017 Pre-operative Diagnosis: right foot ulceration Post-operative diagnosis:  Same Surgeon:  Erlene Quan C. [name], MD Procedure Performed: 1.  US guided cannulation of left common femoral artery 2.  Aortogram with bilateral lower extremity runoff  Indications: 77 year old female with history of right femoral to peroneal artery bypass.  Since that time she had necrosis of her right fourth and fifth toes now has ulceration lateral foot.  She is indicated for angiogram possible intervention.  Findings: The aorta and iliac segments appear free of disease.  On the right side there is brisk flow all the way to the foot via the peroneal artery it is actually faster than the left side. Peroneal artery bypass is is patent and there is no intrarenal segment.  It gives rise to a dorsalis pedis and posterior tibial at the level of the ankle.  On the left the flow is sluggish past the knee but there is two-vessel runoff via PT and peroneal soon there is occluded or highly stenotic segment near the popliteal below the knee.   Procedure:  The patient was identified in the holding area and taken to room 8.  The patient was then placed supine on the table and prepped and draped in the usual sterile fashion.  A time out was called.  Ultrasound was used to evaluate the left common femoral artery.  It was patent .  A digital ultrasound image was acquired.  A micropuncture needle was used to access the left common femoral artery under ultrasound guidance.  An 018 wire was advanced without resistance and a micropuncture sheath was placed.  The 018 wire was removed and a benson wire was placed.  The micropuncture sheath was exchanged for a 5 french sheath.  An omniflush catheter was advanced over the wire to the level of L-1.  An abdominal angiogram was obtained followed by bilateral lower extremity runoff with the above findings.  We also  performed spot findings the level of the ankles.  There was no area to intervene upon the bypass.  With this we elected to remove the catheter over wire.  Sheath to be pulled in postop holding.  Patient tolerated procedure well without immediate complication.  Contrast: 140cc  Wyndell Cardiff C. [name], MD [facility] [place] Office: [phone] Pager: [phone]

## 2017-09-21 NOTE — Progress Notes (Signed)
Site area: left groin fa sheath Site Prior to Removal:  Level 0 Pressure Applied For:  20 minutes Manual:   yes Patient Status During Pull:  stable Post Pull Site:  Level  0 Post Pull Instructions Given:  yes Post Pull Pulses Present: dopplered left DP Dressing Applied:  Gauze and tegaderm Bedrest begins @ 7416 Comments:

## 2017-09-21 NOTE — Discharge Instructions (Signed)
**Note Charlotte [name]-identified via Obfuscation** Femoral Site Care °Refer to this sheet in the next few weeks. These instructions provide you with information about caring for yourself after your procedure. Your health care provider may also give you more specific instructions. Your treatment has been planned according to current medical practices, but problems sometimes occur. Call your health care provider if you have any problems or questions after your procedure. °What can I expect after the procedure? °After your procedure, it is typical to have the following: °· Bruising at the site that usually fades within 1-2 weeks. °· Blood collecting in the tissue (hematoma) that may be painful to the touch. It should usually decrease in size and tenderness within 1-2 weeks. ° °Follow these instructions at home: °· Take medicines only as directed by your health care provider. °· You may shower 24-48 hours after the procedure or as directed by your health care provider. Remove the bandage (dressing) and gently wash the site with plain soap and water. Pat the area dry with a clean towel. Do not rub the site, because this may cause bleeding. °· Do not take baths, swim, or use a hot tub until your health care provider approves. °· Check your insertion site every day for redness, swelling, or drainage. °· Do not apply powder or lotion to the site. °· Limit use of stairs to twice a day for the first 2-3 days or as directed by your health care provider. °· Do not squat for the first 2-3 days or as directed by your health care provider. °· Do not lift over 10 lb (4.5 kg) for 5 days after your procedure or as directed by your health care provider. °· Ask your health care provider when it is okay to: °? Return to work or school. °? Resume usual physical activities or sports. °? Resume sexual activity. °· Do not drive home if you are discharged the same day as the procedure. Have someone else drive you. °· You may drive 24 hours after the procedure unless otherwise instructed by  your health care provider. °· Do not operate machinery or power tools for 24 hours after the procedure or as directed by your health care provider. °· If your procedure was done as an outpatient procedure, which means that you went home the same day as your procedure, a responsible adult should be with you for the first 24 hours after you arrive home. °· Keep all follow-up visits as directed by your health care provider. This is important. °Contact a health care provider if: °· You have a fever. °· You have chills. °· You have increased bleeding from the site. Hold pressure on the site. °Get help right away if: °· You have unusual pain at the site. °· You have redness, warmth, or swelling at the site. °· You have drainage (other than a small amount of blood on the dressing) from the site. °· The site is bleeding, and the bleeding does not stop after 30 minutes of holding steady pressure on the site. °· Your leg or foot becomes pale, cool, tingly, or numb. °This information is not intended to replace advice given to you by your health care provider. Make sure you discuss any questions you have with your health care provider. °Document Released: 03/24/2014 Document Revised: 12/27/2015 Document Reviewed: 02/07/2014 °Elsevier Interactive Patient Education © 2018 Elsevier Inc. ° °

## 2017-09-21 NOTE — H&P (Signed)
Patient ID: Charlotte [name], female   DOB: 1940-10-19, 77 y.o.   MRN: [id number]  Reason for Consult: Follow-up (3-4 wk f/u )   Referred by [name], MD  Subjective:     HPI:  Charlotte [name] is a 77 y.o. female with history of right femoral to TP trunk bypass with vein in August of this year.  She initially had a long hospital stay went to rehab after this.  She is now recovered she is at home.  She does take Coumadin for her atrial fibrillation.  She is also on aspirin and statin.  She has home health care for a wound on her right medial leg from the distal anastomosis incision and also her right fourth and fifth toes gangrene.  She is having some pain in the leg but she is able to walk at this time.  Otherwise she does have significant shortness of breath as well as leg swelling bilaterally and she is wearing oxygen at home      Past Medical History:  Diagnosis Date  . Anxiety   . Arthritis   . Atrial fibrillation, chronic (HCC)    a. on Coumadin for anticoagulation.   . Cellulitis 06/29/2017   RIGHT LOWER LEG  . CHF (congestive heart failure) ([place])    combined  . COPD (chronic obstructive pulmonary disease) ([place])    on home O2  . Deaf   . 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 DVT (deep venous thrombosis) ([place])   . Essential hypertension   . GERD (gastroesophageal reflux disease)   . Hyperlipidemia   . Hypothyroidism   . Peripheral neuropathy   . Peripheral vascular disease ([place])   . 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 Ulcer of ankle ([place])    Bilateral 2015  . Varicose veins         Family History  Problem Relation Age of Onset  . Heart failure Mother   . Heart disease Mother   . Stroke Father   . Heart disease Sister   . Diabetes Son   . Hypertension Sister   . Hypertension Sister   . Stroke Sister    Past Surgical History:  Procedure Laterality Date  . ABDOMINAL AORTAGRAM N/A 09/14/2012   Procedure: ABDOMINAL  [name];  Surgeon: Serafina Mitchell, MD;  Location: [facility] CATH LAB;  Service: Cardiovascular;  Laterality: N/A;  . ABDOMINAL AORTOGRAM N/A 03/06/2017   Procedure: ABDOMINAL AORTOGRAM;  Surgeon: Elam Dutch, MD;  Location: [place] CV LAB;  Service: Cardiovascular;  Laterality: N/A;  . ABDOMINAL HYSTERECTOMY    . CORONARY ANGIOPLASTY    . DRESSING CHANGE UNDER ANESTHESIA  12/02/2011   Procedure: DRESSING CHANGE UNDER ANESTHESIA;  Surgeon: Carole Civil, MD;  Location: [facility];  Service: Orthopedics;  Laterality: Right;  . ENDARTERECTOMY FEMORAL Right 09/15/2012   Procedure: ENDARTERECTOMY FEMORAL;  Surgeon: Mal Misty, MD;  Location: [place];  Service: Vascular;  Laterality: Right;  . FASCIOTOMY  11/29/2011   Procedure: FASCIOTOMY;  Surgeon: Carole Civil, MD;  Location: [facility];  Service: Orthopedics;  Laterality: Right;  right thigh   . FEMORAL-POPLITEAL BYPASS GRAFT Right 09/15/2012   Procedure: BYPASS GRAFT FEMORAL-POPLITEAL ARTERY;  Surgeon: Mal Misty, MD;  Location: [place];  Service: Vascular;  Laterality: Right;  . FEMORAL-TIBIAL BYPASS GRAFT Right 03/11/2017   Procedure: RIGHT FEMORAL-TIBIAL BYPASS IN-SITU GREATER SAPHENOUS VEIN;  Surgeon: Waynetta Sandy, MD;  Location: [place];  Service: Vascular;  Laterality: Right;  . HIP PINNING,CANNULATED  11/19/2011   Procedure: CANNULATED HIP PINNING;  Surgeon: Sanjuana Kava, MD;  Location: [facility];  Service: Orthopedics;  Laterality: Right;  . LOWER EXTREMITY ANGIOGRAPHY Bilateral 03/06/2017   Procedure: Lower Extremity Angiography;  Surgeon: Elam Dutch, MD;  Location: [place] CV LAB;  Service: Cardiovascular;  Laterality: Bilateral;  . PATCH ANGIOPLASTY Right 09/15/2012   Procedure: PATCH ANGIOPLASTY;  Surgeon: Mal Misty, MD;  Location: [place];  Service: Vascular;  Laterality: Right;  . TUBAL LIGATION      Short Social History:  Social History        Tobacco Use  . Smoking status: Never  Smoker  . Smokeless tobacco: Never Used  Substance Use Topics  . Alcohol use: No    Alcohol/week: 0.0 oz    No Known Allergies        Current Outpatient Medications  Medication Sig Dispense Refill  . acetaminophen (TYLENOL) 325 MG tablet Take 2 tablets (650 mg total) by mouth every 6 (six) hours as needed for mild pain or fever.    Marland Kitchen aspirin EC 81 MG EC tablet Take 1 tablet (81 mg total) by mouth daily.    . cephALEXin (KEFLEX) 500 MG capsule Take 1 capsule (500 mg total) by mouth every 8 (eight) hours. 21 capsule 0  . diltiazem (CARDIZEM CD) 240 MG 24 hr capsule Take 240 mg by mouth daily.    . furosemide (LASIX) 40 MG tablet Take 1 tablet (40 mg total) by mouth every other day. 30 tablet   . gabapentin (NEURONTIN) 100 MG capsule Take 100 mg by mouth 3 (three) times daily.      . hydroxyurea (HYDREA) 500 MG capsule Take 2 capsules (1,000 mg total) by mouth daily. May take with food to minimize GI side effects. 60 capsule 2  . levothyroxine (SYNTHROID, LEVOTHROID) 75 MCG tablet Take 1 tablet (75 mcg total) by mouth daily before breakfast.    . metolazone (ZAROXOLYN) 2.5 MG tablet Take 2.5 mg by mouth every Monday, Wednesday, and Friday.    . ondansetron (ZOFRAN ODT) 4 MG disintegrating tablet 4mg  ODT q4 hours prn nausea/vomit 4 tablet 0  . oxyCODONE (OXY IR/ROXICODONE) 5 MG immediate release tablet Take 1 tablet (5 mg total) by mouth every 6 (six) hours as needed for moderate pain. 20 tablet 0  . pravastatin (PRAVACHOL) 10 MG tablet Take 1 tablet (10 mg total) by mouth daily. (Patient taking differently: Take 10 mg every evening by mouth. )    . warfarin (COUMADIN) 2.5 MG tablet Take 1 tablet (2.5 mg total) by mouth daily at 6 PM. Take one tablet all days except take 2.5mg  on MWF (Patient taking differently: Take 2.5-5 mg by mouth daily at 6 PM. Take 2.5 mg tablet every day except take 5 mg on Monday's.) 10 tablet 0   No current facility-administered medications for  this visit.     Review of Systems  Constitutional:  Constitutional negative. HENT: HENT negative.  Eyes: Eyes negative.  Respiratory: Positive for shortness of breath.  Cardiovascular: Positive for dyspnea with exertion and leg swelling.  GI: Gastrointestinal negative.  Musculoskeletal: Positive for leg pain.  Skin: Positive for wound.  Neurological: Positive for dizziness and numbness.  Hematologic: Positive for bruises/bleeds easily.  Psychiatric: Psychiatric negative.        Objective:  Objective      Vitals:   07/17/17 1522  BP: 99/64  Pulse: 84  Resp: 18  Temp: 97.6 F (  36.4 C)  TempSrc: Oral  SpO2: 97%  Weight: 159 lb (72.1 kg)  Height: 6' (1.829 m)   Body mass index is 21.56 kg/m.  Physical Exam  Constitutional: She appears well-developed.  HENT:  Head: Normocephalic.  Eyes: Pupils are equal, round, and reactive to light.  Neck: Normal range of motion.  Cardiovascular: Normal rate.  Pulses:      Radial pulses are 2+ on the right side, and 2+ on the left side.       Femoral pulses are 2+ on the right side, and 2+ on the left side. Palpable pulse in right bypass graft Monophasic signals in right dp/pt  Abdominal: Soft. She exhibits no mass.  Musculoskeletal: Normal range of motion. She exhibits edema.  Neurological: She is alert.  Psychiatric: She has a normal mood and affect. Her behavior is normal.    Data: I independently interpreted her right lower extremity duplex which demonstrates an elevated velocity of 206 near the knee with a ratio 3.5.  The ABI is 0.67 with a toe pressure of 47 monophasic signals on the right PT and DP.  On the left side she has triphasic waveforms with noncompressible ABIs and toe pressure 76.     Assessment/Plan:   77 year old female status post right femoral to TP trunk bypass.  She has ulceration on the lateral aspect of her right foot. Will need angiogram to evaluate bypass. Toes actually look better but ulcer  is worse laterally and possibly bone is exposed. Will proceed with angiogram and possibly will need debridement and toe amputation while here given that coumadin has been held.    Waynetta Sandy MD [facility]

## 2017-09-22 ENCOUNTER — Telehealth: Payer: Self-pay | Admitting: *Deleted

## 2017-09-22 ENCOUNTER — Encounter (HOSPITAL_COMMUNITY): Payer: Self-pay | Admitting: Vascular Surgery

## 2017-09-22 ENCOUNTER — Other Ambulatory Visit (HOSPITAL_COMMUNITY): Payer: Medicare Other

## 2017-09-22 ENCOUNTER — Ambulatory Visit (HOSPITAL_COMMUNITY): Payer: Medicare Other | Admitting: Oncology

## 2017-09-22 MED ORDER — TORSEMIDE 20 MG PO TABS: 20.0000 mg | ORAL_TABLET | Freq: Every day | ORAL | 11 refills | Status: DC | PRN

## 2017-09-22 MED FILL — Lidocaine HCl Local Inj 1%: INTRAMUSCULAR | Qty: 20 | Status: AC

## 2017-09-22 MED FILL — Heparin Sodium (Porcine) 2 Unit/ML in Sodium Chloride 0.9%: INTRAMUSCULAR | Qty: 1000 | Status: AC

## 2017-09-22 NOTE — Telephone Encounter (Signed)
If it is not covered, she can take torsemide 20 mg extra for weight gain of 3 lbs in 24 hours.

## 2017-09-22 NOTE — Telephone Encounter (Signed)
Husband walked in and states that [facility] will no longer cover Metolazone. Husband states that they did receive one last refill covered by insurance. [employer] called for an exception on Metolazone and to ask what medications are covered under formulary. Nicole Kindred with [facility] states that the medications covered are HCTZ, Chlorothiazide, Chlorthalidone, and Indapamide. Nicole Kindred states the pharmacy review will take 24 to 48 before we will know if Metolazone is approved or denied. Reference Number is [id number]

## 2017-09-22 NOTE — Telephone Encounter (Signed)
Mr. Cutter called requesting to speak with Edrick Oh, RN in regards to his wife's coumdin medication .

## 2017-09-22 NOTE — Telephone Encounter (Signed)
Spoke with spouse.  Pt had an abdominal aortogram yesterday and has been off coumadin x 7 days..  INR was 1.2 before procedure yesterday.  Husband states he received a call from Dr Claretha Cooper office this morning.  They have scheduled pt for an amputation of Rt little toe on 2/26.  Pt to take last dose of coumadin on 2/21.  Instructed him to have pt take coumadin 5mg  tonight and tomorrow night, 2.5mg  Thursday night then stop for procedure.  Husband will call for instructions re. Restarting coumadin after procedure.  He verbalized understanding.

## 2017-09-22 NOTE — Telephone Encounter (Signed)
Husband notified of medication changes

## 2017-09-22 NOTE — Addendum Note (Signed)
Addended by: Levonne Hubert on: 09/22/2017 04:28 PM   Modules accepted: Orders

## 2017-09-23 ENCOUNTER — Other Ambulatory Visit: Payer: Self-pay | Admitting: *Deleted

## 2017-09-26 ENCOUNTER — Encounter (HOSPITAL_COMMUNITY): Payer: Self-pay | Admitting: *Deleted

## 2017-09-26 NOTE — Progress Notes (Signed)
Spoke to husband at patient request. Denies chest pain or shortness of breath above baseline. Last dose of coumadin 09/24/2017. Husband states he was told to arrive by office at 0700. Request he contact office on Monday as we have an arrival time of 0800 based off of current surgery time.

## 2017-09-28 NOTE — Progress Notes (Signed)
Anesthesia Chart Review:  Pt is a same day work up.   Pt is a 77 year old female scheduled for debridement, possible toe amputation R toes 4 and 5 on 09/29/2017 with Servando Snare, MD  - PCP is [name], MD - Cardiologist is Kate Sable, MD. Last evaluation by cardiology 03/21/17 while pt was in hospital.  - Last pulmonology evaluation 03/26/17 while in the hospital - Hematologist is Twana First, MD. Last office visit 06/22/17  PMH includes:  CHF, chronic atrial fibrillation, HTN, hyperlipidemia, COPD, chronic respiratory failure, uses 4L O2, polycytemia vera, hypothyroidism, GERD. Hard of hearing. Never smoker. BMI 18.  - S/p cataract extraction 08/31/17 and 09/14/17. S/p R femoral-tibial bypass with in situ vein bypass 03/11/17. S/p R femoral endarterectomy, FPBG 09/15/12. S/p cannulated hip pinning 11/19/11, fasciotomy 11/29/11, fasciotomy closure 12/02/11.   - Hospitalized 11/24-11/27/18 cellulitis and abscess of RLE, PVD with dry gangrene.   - Hospitalized 11/5-11/18/18 for [name]  - Hospitalized 10/25-10/29/18 for hypotension, presumed sepsis, RLE cellulitis, acute kidney injury  - Hospitalized 8/1-8/26/18 for septic shock, limb ischemia/cellulitis, acute diastolic HF, acute kidney injury, acute on chronic hypoxic respiratory failure requiring intubation 8/8 - 03/20/17, transaminitis, anasarca, Klebsiella pneumonia. S/p R femoral-tibial bypass with in situ vein bypass 03/11/17. Discharged to [facility].    Medications include: ASA 81 mg, diltiazem, Lasix, hydroxyurea, levothyroxine, pravastatin, torsemide, Coumadin last dose coumadin 09/24/17  Labs will be obtained day of surgery.   CXR 07/03/17:  - No marked change in moderate to moderately large bilateral pleural effusions and basilar airspace disease, worse on the left.  EKG 07/03/17: Atrial flutter. Right axis deviation. Borderline low voltage, extremity leads. Prolonged QT interval  Echo 03/24/17:  - Left ventricle: The cavity size  was normal. Wall thickness was normal. Systolic function was normal. The estimated ejection fraction was in the range of 60% to 65%. Indeterminant diastolic function (atrial fibrillation). Wall motion was normal; there were no regional wall motion abnormalities. - Ventricular septum: D-shaped interventricular septum suggestive of RV pressure/volume overload. - Aortic valve: There was no stenosis. - Mitral valve: Mildly calcified annulus. There was mild regurgitation. - Left atrium: The atrium was severely dilated. - Right ventricle: The cavity size was moderately dilated. Systolic function was moderately reduced. - Right atrium: The atrium was severely dilated. - Tricuspid valve: Peak RV-RA gradient (S): 43 mm Hg. - Pulmonary arteries: PA peak pressure: 58 mm Hg (S). - Systemic veins: IVC measured 2.5 cm with < 50% respirophasic variation, suggesting RA pressure 15 mmHg. - Pericardium, extracardiac: There is a left-sided pleural effusion. - Impressions:The patient was in atrial fibrillation. Normal LV size with EF 60-65%. D-shaped interventricular septum suggesting RV pressure/volume overload. Moderately dilated RV with moderately decreased systolic function. Severe biatrial enlargement. Moderate pulmonary hypertension. Dilated IVC suggestive of elevated RV filling pressure.  Nuclear stress test 03/07/17:  1. No reversible ischemia or infarction. 2. Normal left ventricular wall motion. 3. Left ventricular ejection fraction 69% 4. Non invasive risk stratification: Low  If labs acceptable day of surgery, I anticipate pt can proceed as scheduled.   Willeen Cass, FNP-BC [facility]/Anesthesiology Phone: [phone] 09/28/2017 11:41 AM

## 2017-09-29 ENCOUNTER — Encounter (HOSPITAL_COMMUNITY)
Admission: RE | Disposition: A | Discharge status: Home Health | Payer: Self-pay | Source: Ambulatory Visit | Attending: Vascular Surgery

## 2017-09-29 ENCOUNTER — Ambulatory Visit (HOSPITAL_COMMUNITY): Payer: Medicare Other | Admitting: Emergency Medicine

## 2017-09-29 ENCOUNTER — Inpatient Hospital Stay (HOSPITAL_COMMUNITY)
Admission: RE | Admit: 2017-09-29 | Discharge: 2017-10-01 | DRG: 475 | Disposition: A | Discharge status: Home Health | Payer: Medicare Other | Source: Ambulatory Visit | Attending: Vascular Surgery | Admitting: Vascular Surgery

## 2017-09-29 ENCOUNTER — Encounter (HOSPITAL_COMMUNITY): Payer: Self-pay | Admitting: *Deleted

## 2017-09-29 DIAGNOSIS — E039 Hypothyroidism, unspecified: Secondary | ICD-10-CM | POA: Diagnosis present

## 2017-09-29 DIAGNOSIS — I272 Pulmonary hypertension, unspecified: Secondary | ICD-10-CM | POA: Diagnosis not present

## 2017-09-29 DIAGNOSIS — Z9861 Coronary angioplasty status: Secondary | ICD-10-CM

## 2017-09-29 DIAGNOSIS — M869 Osteomyelitis, unspecified: Principal | ICD-10-CM | POA: Diagnosis present

## 2017-09-29 DIAGNOSIS — M7989 Other specified soft tissue disorders: Secondary | ICD-10-CM | POA: Diagnosis present

## 2017-09-29 DIAGNOSIS — L02611 Cutaneous abscess of right foot: Secondary | ICD-10-CM | POA: Diagnosis not present

## 2017-09-29 DIAGNOSIS — I5042 Chronic combined systolic (congestive) and diastolic (congestive) heart failure: Secondary | ICD-10-CM | POA: Diagnosis not present

## 2017-09-29 DIAGNOSIS — J449 Chronic obstructive pulmonary disease, unspecified: Secondary | ICD-10-CM | POA: Diagnosis present

## 2017-09-29 DIAGNOSIS — Z8249 Family history of ischemic heart disease and other diseases of the circulatory system: Secondary | ICD-10-CM

## 2017-09-29 DIAGNOSIS — Z79899 Other long term (current) drug therapy: Secondary | ICD-10-CM

## 2017-09-29 DIAGNOSIS — Z95828 Presence of other vascular implants and grafts: Secondary | ICD-10-CM | POA: Diagnosis not present

## 2017-09-29 DIAGNOSIS — Z7901 Long term (current) use of anticoagulants: Secondary | ICD-10-CM | POA: Diagnosis not present

## 2017-09-29 DIAGNOSIS — M1990 Unspecified osteoarthritis, unspecified site: Secondary | ICD-10-CM | POA: Diagnosis present

## 2017-09-29 DIAGNOSIS — R112 Nausea with vomiting, unspecified: Secondary | ICD-10-CM | POA: Diagnosis present

## 2017-09-29 DIAGNOSIS — B9562 Methicillin resistant Staphylococcus aureus infection as the cause of diseases classified elsewhere: Secondary | ICD-10-CM | POA: Diagnosis present

## 2017-09-29 DIAGNOSIS — I739 Peripheral vascular disease, unspecified: Secondary | ICD-10-CM | POA: Diagnosis present

## 2017-09-29 DIAGNOSIS — D45 Polycythemia vera: Secondary | ICD-10-CM | POA: Diagnosis not present

## 2017-09-29 DIAGNOSIS — I110 Hypertensive heart disease with heart failure: Secondary | ICD-10-CM | POA: Diagnosis not present

## 2017-09-29 DIAGNOSIS — E785 Hyperlipidemia, unspecified: Secondary | ICD-10-CM | POA: Diagnosis present

## 2017-09-29 DIAGNOSIS — M79604 Pain in right leg: Secondary | ICD-10-CM | POA: Diagnosis not present

## 2017-09-29 DIAGNOSIS — D649 Anemia, unspecified: Secondary | ICD-10-CM | POA: Diagnosis present

## 2017-09-29 DIAGNOSIS — G629 Polyneuropathy, unspecified: Secondary | ICD-10-CM | POA: Diagnosis present

## 2017-09-29 DIAGNOSIS — J9611 Chronic respiratory failure with hypoxia: Secondary | ICD-10-CM | POA: Diagnosis not present

## 2017-09-29 DIAGNOSIS — I482 Chronic atrial fibrillation: Secondary | ICD-10-CM | POA: Diagnosis not present

## 2017-09-29 DIAGNOSIS — L97519 Non-pressure chronic ulcer of other part of right foot with unspecified severity: Secondary | ICD-10-CM | POA: Diagnosis present

## 2017-09-29 DIAGNOSIS — H9190 Unspecified hearing loss, unspecified ear: Secondary | ICD-10-CM | POA: Diagnosis present

## 2017-09-29 DIAGNOSIS — L03031 Cellulitis of right toe: Secondary | ICD-10-CM | POA: Diagnosis not present

## 2017-09-29 DIAGNOSIS — Z9981 Dependence on supplemental oxygen: Secondary | ICD-10-CM | POA: Diagnosis not present

## 2017-09-29 DIAGNOSIS — K219 Gastro-esophageal reflux disease without esophagitis: Secondary | ICD-10-CM | POA: Diagnosis not present

## 2017-09-29 DIAGNOSIS — S91104A Unspecified open wound of right lesser toe(s) without damage to nail, initial encounter: Secondary | ICD-10-CM | POA: Diagnosis not present

## 2017-09-29 DIAGNOSIS — Z7982 Long term (current) use of aspirin: Secondary | ICD-10-CM

## 2017-09-29 DIAGNOSIS — M86671 Other chronic osteomyelitis, right ankle and foot: Secondary | ICD-10-CM | POA: Diagnosis not present

## 2017-09-29 DIAGNOSIS — Z79890 Hormone replacement therapy (postmenopausal): Secondary | ICD-10-CM

## 2017-09-29 HISTORY — PX: AMPUTATION: SHX166

## 2017-09-29 HISTORY — DX: Dependence on supplemental oxygen: Z99.81

## 2017-09-29 HISTORY — PX: APPLICATION OF WOUND VAC: SHX5189

## 2017-09-29 LAB — CBC
HCT: 29.1 % — ABNORMAL LOW (ref 36.0–46.0)
HEMATOCRIT: 32 % — AB (ref 36.0–46.0)
HEMOGLOBIN: 9.3 g/dL — AB (ref 12.0–15.0)
HEMOGLOBIN: 8.5 g/dL — AB (ref 12.0–15.0)
MCH: 25.7 pg — AB (ref 26.0–34.0)
MCH: 25.8 pg — AB (ref 26.0–34.0)
MCHC: 29.1 g/dL — ABNORMAL LOW (ref 30.0–36.0)
MCHC: 29.2 g/dL — AB (ref 30.0–36.0)
MCV: 88.4 fL (ref 78.0–100.0)
MCV: 88.2 fL (ref 78.0–100.0)
PLATELETS: 320 10*3/uL (ref 150–400)
Platelets: 366 10*3/uL (ref 150–400)
RBC: 3.62 MIL/uL — ABNORMAL LOW (ref 3.87–5.11)
RBC: 3.3 MIL/uL — ABNORMAL LOW (ref 3.87–5.11)
RDW: 23 % — ABNORMAL HIGH (ref 11.5–15.5)
RDW: 23 % — AB (ref 11.5–15.5)
WBC: 9.6 10*3/uL (ref 4.0–10.5)
WBC: 8.5 10*3/uL (ref 4.0–10.5)

## 2017-09-29 LAB — BASIC METABOLIC PANEL
Anion gap: 13 (ref 5–15)
BUN: 18 mg/dL (ref 6–20)
CHLORIDE: 106 mmol/L (ref 101–111)
CO2: 20 mmol/L — AB (ref 22–32)
Calcium: 8.6 mg/dL — ABNORMAL LOW (ref 8.9–10.3)
Creatinine, Ser: 1.06 mg/dL — ABNORMAL HIGH (ref 0.44–1.00)
GFR calc Af Amer: 58 mL/min — ABNORMAL LOW (ref >60)
GFR calc non Af Amer: 50 mL/min — ABNORMAL LOW (ref >60)
GLUCOSE: 86 mg/dL (ref 65–99)
POTASSIUM: 4 mmol/L (ref 3.5–5.1)
SODIUM: 139 mmol/L (ref 135–145)

## 2017-09-29 LAB — COMPREHENSIVE METABOLIC PANEL
ALBUMIN: 2.3 g/dL — AB (ref 3.5–5.0)
ALT: 6 U/L — ABNORMAL LOW (ref 14–54)
ANION GAP: 9 (ref 5–15)
AST: 14 U/L — ABNORMAL LOW (ref 15–41)
Alkaline Phosphatase: 81 U/L (ref 38–126)
BUN: 16 mg/dL (ref 6–20)
CALCIUM: 8.1 mg/dL — AB (ref 8.9–10.3)
CHLORIDE: 108 mmol/L (ref 101–111)
CO2: 22 mmol/L (ref 22–32)
Creatinine, Ser: 1 mg/dL (ref 0.44–1.00)
GFR calc non Af Amer: 54 mL/min — ABNORMAL LOW (ref >60)
GLUCOSE: 96 mg/dL (ref 65–99)
POTASSIUM: 3.2 mmol/L — AB (ref 3.5–5.1)
SODIUM: 139 mmol/L (ref 135–145)
Total Bilirubin: 0.8 mg/dL (ref 0.3–1.2)
Total Protein: 6 g/dL — ABNORMAL LOW (ref 6.5–8.1)

## 2017-09-29 LAB — PROTIME-INR
INR: 1.94
INR: 1.85
Prothrombin Time: 22 seconds — ABNORMAL HIGH (ref 11.4–15.2)
Prothrombin Time: 21.2 seconds — ABNORMAL HIGH (ref 11.4–15.2)

## 2017-09-29 LAB — APTT: aPTT: 61 seconds — ABNORMAL HIGH (ref 24–36)

## 2017-09-29 SURGERY — AMPUTATION DIGIT
Anesthesia: Regional | Site: Foot | Laterality: Right

## 2017-09-29 MED ORDER — HYDROCODONE-ACETAMINOPHEN 5-325 MG PO TABS: 1.0000 | ORAL_TABLET | ORAL | Status: DC | PRN

## 2017-09-29 MED ORDER — PROPOFOL 10 MG/ML IV BOLUS
INTRAVENOUS | Status: DC | PRN
  Administered 2017-09-29: 20 mg via INTRAVENOUS

## 2017-09-29 MED ORDER — PANTOPRAZOLE SODIUM 40 MG PO TBEC
40.0000 mg | DELAYED_RELEASE_TABLET | Freq: Every day | ORAL | Status: DC
  Administered 2017-09-29 – 2017-10-01 (×3): 40 mg via ORAL
  Filled 2017-09-29 (×3): qty 1

## 2017-09-29 MED ORDER — GABAPENTIN 100 MG PO CAPS
100.0000 mg | ORAL_CAPSULE | Freq: Three times a day (TID) | ORAL | Status: DC
  Administered 2017-09-29 – 2017-10-01 (×6): 100 mg via ORAL
  Filled 2017-09-29 (×6): qty 1

## 2017-09-29 MED ORDER — PROPOFOL 500 MG/50ML IV EMUL
INTRAVENOUS | Status: DC | PRN
  Administered 2017-09-29: 50 ug/kg/min via INTRAVENOUS

## 2017-09-29 MED ORDER — OXYCODONE-ACETAMINOPHEN 5-325 MG PO TABS
1.0000 | ORAL_TABLET | ORAL | Status: DC | PRN
  Administered 2017-09-29 – 2017-09-30 (×3): 2 via ORAL
  Filled 2017-09-29 (×3): qty 2

## 2017-09-29 MED ORDER — ACETAMINOPHEN 325 MG PO TABS: 325.0000 mg | ORAL_TABLET | ORAL | Status: DC | PRN

## 2017-09-29 MED ORDER — ONDANSETRON HCL 4 MG/2ML IJ SOLN
4.0000 mg | Freq: Four times a day (QID) | INTRAMUSCULAR | Status: DC | PRN
  Administered 2017-10-01: 4 mg via INTRAVENOUS
  Filled 2017-09-29: qty 2

## 2017-09-29 MED ORDER — 0.9 % SODIUM CHLORIDE (POUR BTL) OPTIME
TOPICAL | Status: DC | PRN
  Administered 2017-09-29: 1000 mL

## 2017-09-29 MED ORDER — WARFARIN SODIUM 2.5 MG PO TABS: 2.5000 mg | ORAL_TABLET | Freq: Every day | ORAL | Status: DC

## 2017-09-29 MED ORDER — ASPIRIN EC 81 MG PO TBEC
81.0000 mg | DELAYED_RELEASE_TABLET | Freq: Every day | ORAL | Status: DC
  Administered 2017-09-29 – 2017-10-01 (×3): 81 mg via ORAL
  Filled 2017-09-29 (×3): qty 1

## 2017-09-29 MED ORDER — LIDOCAINE 2% (20 MG/ML) 5 ML SYRINGE
INTRAMUSCULAR | Status: AC
  Filled 2017-09-29: qty 5

## 2017-09-29 MED ORDER — PREDNISOLONE ACETATE 1 % OP SUSP
1.0000 [drp] | Freq: Three times a day (TID) | OPHTHALMIC | Status: DC
  Administered 2017-09-29 – 2017-10-01 (×6): 1 [drp] via OPHTHALMIC
  Filled 2017-09-29: qty 1

## 2017-09-29 MED ORDER — GUAIFENESIN-DM 100-10 MG/5ML PO SYRP: 15.0000 mL | ORAL_SOLUTION | ORAL | Status: DC | PRN

## 2017-09-29 MED ORDER — ALUM & MAG HYDROXIDE-SIMETH 200-200-20 MG/5ML PO SUSP: 15.0000 mL | ORAL | Status: DC | PRN

## 2017-09-29 MED ORDER — ACETAMINOPHEN 650 MG RE SUPP: 325.0000 mg | RECTAL | Status: DC | PRN

## 2017-09-29 MED ORDER — FUROSEMIDE 40 MG PO TABS: 40.0000 mg | ORAL_TABLET | ORAL | Status: DC

## 2017-09-29 MED ORDER — PRAVASTATIN SODIUM 20 MG PO TABS
10.0000 mg | ORAL_TABLET | Freq: Every day | ORAL | Status: DC
  Administered 2017-09-29 – 2017-10-01 (×3): 10 mg via ORAL
  Filled 2017-09-29 (×3): qty 1

## 2017-09-29 MED ORDER — LEVOTHYROXINE SODIUM 75 MCG PO TABS
75.0000 ug | ORAL_TABLET | Freq: Every day | ORAL | Status: DC
  Administered 2017-09-30 – 2017-10-01 (×2): 75 ug via ORAL
  Filled 2017-09-29 (×2): qty 1

## 2017-09-29 MED ORDER — MORPHINE SULFATE (PF) 2 MG/ML IV SOLN
2.0000 mg | INTRAVENOUS | Status: DC | PRN
  Administered 2017-09-30: 4 mg via INTRAVENOUS
  Administered 2017-10-01: 2 mg via INTRAVENOUS
  Filled 2017-09-29: qty 2
  Filled 2017-09-29: qty 1

## 2017-09-29 MED ORDER — FENTANYL CITRATE (PF) 250 MCG/5ML IJ SOLN
INTRAMUSCULAR | Status: AC
  Filled 2017-09-29: qty 5

## 2017-09-29 MED ORDER — ENOXAPARIN SODIUM 40 MG/0.4ML ~~LOC~~ SOLN
40.0000 mg | SUBCUTANEOUS | Status: DC
  Administered 2017-09-29 – 2017-09-30 (×2): 40 mg via SUBCUTANEOUS
  Filled 2017-09-29 (×2): qty 0.4

## 2017-09-29 MED ORDER — SODIUM CHLORIDE 0.9 % IV SOLN
1.5000 g | INTRAVENOUS | Status: AC
  Administered 2017-09-29: 1.5 g via INTRAVENOUS
  Filled 2017-09-29: qty 1.5

## 2017-09-29 MED ORDER — POTASSIUM CHLORIDE CRYS ER 20 MEQ PO TBCR
20.0000 meq | EXTENDED_RELEASE_TABLET | Freq: Once | ORAL | Status: AC
  Administered 2017-09-29: 40 meq via ORAL
  Filled 2017-09-29: qty 2

## 2017-09-29 MED ORDER — SODIUM CHLORIDE 0.9 % IV SOLN: INTRAVENOUS | Status: DC

## 2017-09-29 MED ORDER — PHENOL 1.4 % MT LIQD: 1.0000 | OROMUCOSAL | Status: DC | PRN

## 2017-09-29 MED ORDER — PROPOFOL 10 MG/ML IV BOLUS
INTRAVENOUS | Status: AC
  Filled 2017-09-29: qty 20

## 2017-09-29 MED ORDER — LABETALOL HCL 5 MG/ML IV SOLN: 10.0000 mg | INTRAVENOUS | Status: DC | PRN

## 2017-09-29 MED ORDER — LIDOCAINE 2% (20 MG/ML) 5 ML SYRINGE
INTRAMUSCULAR | Status: DC | PRN
  Administered 2017-09-29: 100 mg via INTRAVENOUS

## 2017-09-29 MED ORDER — TORSEMIDE 20 MG PO TABS
20.0000 mg | ORAL_TABLET | Freq: Every day | ORAL | Status: DC
  Administered 2017-09-29 – 2017-09-30 (×2): 20 mg via ORAL
  Filled 2017-09-29 (×2): qty 1

## 2017-09-29 MED ORDER — ONDANSETRON HCL 4 MG/2ML IJ SOLN
INTRAMUSCULAR | Status: AC
  Filled 2017-09-29: qty 2

## 2017-09-29 MED ORDER — OFLOXACIN 0.3 % OP SOLN
1.0000 [drp] | Freq: Four times a day (QID) | OPHTHALMIC | Status: DC
  Administered 2017-09-29 – 2017-10-01 (×6): 1 [drp] via OPHTHALMIC
  Filled 2017-09-29 (×2): qty 5

## 2017-09-29 MED ORDER — HYDRALAZINE HCL 20 MG/ML IJ SOLN: 5.0000 mg | INTRAMUSCULAR | Status: DC | PRN

## 2017-09-29 MED ORDER — LACTATED RINGERS IV SOLN: INTRAVENOUS | Status: DC

## 2017-09-29 MED ORDER — FUROSEMIDE 40 MG PO TABS
40.0000 mg | ORAL_TABLET | Freq: Every day | ORAL | Status: DC
  Administered 2017-09-29 – 2017-10-01 (×3): 40 mg via ORAL
  Filled 2017-09-29 (×3): qty 1

## 2017-09-29 MED ORDER — ONDANSETRON HCL 4 MG/2ML IJ SOLN
INTRAMUSCULAR | Status: DC | PRN
  Administered 2017-09-29: 4 mg via INTRAVENOUS

## 2017-09-29 MED ORDER — HYDROXYUREA 500 MG PO CAPS
1000.0000 mg | ORAL_CAPSULE | Freq: Every day | ORAL | Status: DC
  Administered 2017-09-29 – 2017-10-01 (×3): 1000 mg via ORAL
  Filled 2017-09-29 (×3): qty 2

## 2017-09-29 MED ORDER — LIDOCAINE HCL (PF) 1 % IJ SOLN
INTRAMUSCULAR | Status: AC
  Filled 2017-09-29: qty 30

## 2017-09-29 MED ORDER — FENTANYL CITRATE (PF) 100 MCG/2ML IJ SOLN
INTRAMUSCULAR | Status: DC | PRN
  Administered 2017-09-29: 25 ug via INTRAVENOUS

## 2017-09-29 MED ORDER — ROPIVACAINE HCL 7.5 MG/ML IJ SOLN
INTRAMUSCULAR | Status: DC | PRN
Start: 2017-09-29 — End: 2017-09-29
  Administered 2017-09-29: 30 mL via PERINEURAL

## 2017-09-29 MED ORDER — LACTATED RINGERS IV SOLN
INTRAVENOUS | Status: DC
  Administered 2017-09-29: 09:00:00 via INTRAVENOUS

## 2017-09-29 MED ORDER — METOPROLOL TARTRATE 5 MG/5ML IV SOLN: 2.0000 mg | INTRAVENOUS | Status: DC | PRN

## 2017-09-29 MED ORDER — PRAVASTATIN SODIUM 20 MG PO TABS
10.0000 mg | ORAL_TABLET | Freq: Every day | ORAL | Status: DC
  Administered 2017-09-30 – 2017-10-01 (×2): 10 mg via ORAL
  Filled 2017-09-29 (×2): qty 1

## 2017-09-29 MED ORDER — KETOROLAC TROMETHAMINE 0.5 % OP SOLN: 1.0000 [drp] | OPHTHALMIC | Status: DC

## 2017-09-29 MED ORDER — DILTIAZEM HCL ER COATED BEADS 240 MG PO CP24
240.0000 mg | ORAL_CAPSULE | Freq: Every day | ORAL | Status: DC
  Administered 2017-09-29 – 2017-10-01 (×3): 240 mg via ORAL
  Filled 2017-09-29 (×3): qty 1

## 2017-09-29 SURGICAL SUPPLY — 32 items
BANDAGE ACE 4X5 VEL STRL LF (GAUZE/BANDAGES/DRESSINGS) ×2 IMPLANT
BLADE AVERAGE 25X9 (BLADE) ×2 IMPLANT
BLADE SAW SGTL 81X20 HD (BLADE) IMPLANT
BNDG GAUZE ELAST 4 BULKY (GAUZE/BANDAGES/DRESSINGS) ×2 IMPLANT
CANISTER SUCT 3000ML PPV (MISCELLANEOUS) ×2 IMPLANT
CONT SPEC 4OZ CLIKSEAL STRL BL (MISCELLANEOUS) ×2 IMPLANT
COVER SURGICAL LIGHT HANDLE (MISCELLANEOUS) ×2 IMPLANT
DRAPE EXTREMITY T 121X128X90 (DRAPE) ×2 IMPLANT
DRAPE HALF SHEET 40X57 (DRAPES) ×2 IMPLANT
DRSG VAC ATS SM SENSATRAC (GAUZE/BANDAGES/DRESSINGS) ×2 IMPLANT
ELECT REM PT RETURN 9FT ADLT (ELECTROSURGICAL) ×2
ELECTRODE REM PT RTRN 9FT ADLT (ELECTROSURGICAL) ×1 IMPLANT
GAUZE SPONGE 4X4 12PLY STRL (GAUZE/BANDAGES/DRESSINGS) ×2 IMPLANT
GLOVE BIO SURGEON STRL SZ7.5 (GLOVE) ×2 IMPLANT
GLOVE BIOGEL PI IND STRL 6.5 (GLOVE) ×1 IMPLANT
GLOVE BIOGEL PI INDICATOR 6.5 (GLOVE) ×1
GOWN STRL REUS W/ TWL LRG LVL3 (GOWN DISPOSABLE) ×2 IMPLANT
GOWN STRL REUS W/ TWL XL LVL3 (GOWN DISPOSABLE) ×1 IMPLANT
GOWN STRL REUS W/TWL LRG LVL3 (GOWN DISPOSABLE) ×2
GOWN STRL REUS W/TWL XL LVL3 (GOWN DISPOSABLE) ×1
KIT BASIN OR (CUSTOM PROCEDURE TRAY) ×2 IMPLANT
KIT ROOM TURNOVER OR (KITS) ×2 IMPLANT
NEEDLE HYPO 25GX1X1/2 BEV (NEEDLE) IMPLANT
NS IRRIG 1000ML POUR BTL (IV SOLUTION) ×2 IMPLANT
PACK GENERAL/GYN (CUSTOM PROCEDURE TRAY) ×2 IMPLANT
PAD ARMBOARD 7.5X6 YLW CONV (MISCELLANEOUS) ×4 IMPLANT
SUT ETHILON 3 0 PS 1 (SUTURE) ×2 IMPLANT
SYR CONTROL 10ML LL (SYRINGE) IMPLANT
TOWEL GREEN STERILE (TOWEL DISPOSABLE) ×4 IMPLANT
UNDERPAD 30X30 (UNDERPADS AND DIAPERS) ×2 IMPLANT
WATER STERILE IRR 1000ML POUR (IV SOLUTION) ×2 IMPLANT
WND VAC CANISTER 500ML (MISCELLANEOUS) ×2 IMPLANT

## 2017-09-29 NOTE — Evaluation (Signed)
Physical Therapy Evaluation Patient Details Name: DURA MCCORMACK MRN: [id number] DOB: Dec 04, 1940 Today's Date: 09/29/2017   History of Present Illness  Pt is a 77 y.o. 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 PMH includes RLE revascularization, PVD, peripheral neuropathy, HTN, COPD, CHF, a-fib, anxiety.    Clinical Impression  Pt presents with an overall decrease in functional mobility secondary to above. PTA, pt mod indep with RW at home using w/c for community distances; lives with family available for 24/7 support. Educ on precautions, positioning, and importance of mobility. Today, pt able to transfer bed<>BSC with RW and supervision for safety; cues for WB through R heel only as darco shoe not delivered yet. Once pt has shoe, feel she will be ambulating well. Pt would benefit from continued acute PT services to maximize functional mobility and independence prior to d/c with HHPT services.     Follow Up Recommendations Home health PT;Supervision for mobility/OOB    Equipment Recommendations  None recommended by PT(owns DME)    Recommendations for Other Services       Precautions / Restrictions Precautions Precautions: Fall Restrictions Weight Bearing Restrictions: Yes RLE Weight Bearing: Partial weight bearing RLE Partial Weight Bearing Percentage or Pounds: PWB through heel only in Darco shoe      Mobility  Bed Mobility Overal bed mobility: Modified Independent             General bed mobility comments: HOB elevated; no physical assist required  Transfers Overall transfer level: Needs assistance Equipment used: Rolling walker (2 wheeled) Transfers: Sit to/from Stand Sit to Stand: Supervision         General transfer comment: Able to stand from bed and BSC with RW and supervision for safety. Cues to maintain RLE WB through heel only (darco boot not delivered yet)  Ambulation/Gait Ambulation/Gait assistance:  Supervision Ambulation Distance (Feet): 3 Feet Assistive device: Rolling walker (2 wheeled) Gait Pattern/deviations: Step-to pattern;Antalgic Gait velocity: Decreased   General Gait Details: Pt able to hop short distance from bed<>BSC with RW and supervision for safety; cues for hop-to pattern as pt with decreased ability to WB through L heel only (darco shoe not delivered yet)  Stairs            Wheelchair Mobility    Modified Rankin (Stroke Patients Only)       Balance Overall balance assessment: Needs assistance   Sitting balance-Leahy Scale: Good Sitting balance - Comments: Indep with pericare sitting on BSC     Standing balance-Leahy Scale: Poor Standing balance comment: Reliant on UE support                             Pertinent Vitals/Pain Pain Assessment: Faces Faces Pain Scale: Hurts a little bit Pain Location: R foot incision Pain Intervention(s): Monitored during session;Limited activity within patient's tolerance    Home Living Family/patient expects to be discharged to:: Private residence Living Arrangements: Spouse/significant other Available Help at Discharge: Family;Available 24 hours/day Type of Home: Mobile home Home Access: Ramped entrance     Home Layout: One level Home Equipment: [place] - 2 wheels;Bedside commode;Wheelchair - manual;Tub bench      Prior Function Level of Independence: Needs assistance   Gait / Transfers Assistance Needed: Mod indep with household amb using RW. Uses manual w/c for community distances  ADL's / Homemaking Assistance Needed: Pt reports supervision from husband for ADLs, but able to perform herself  Hand Dominance        Extremity/Trunk Assessment   Upper Extremity Assessment Upper Extremity Assessment: Overall WFL for tasks assessed    Lower Extremity Assessment Lower Extremity Assessment: RLE deficits/detail RLE Deficits / Details: s/p R toe amputation; grossly 4/5  throughout    Cervical / Trunk Assessment Cervical / Trunk Assessment: Kyphotic  Communication   Communication: HOH  Cognition Arousal/Alertness: Awake/alert Behavior During Therapy: Flat affect Overall Cognitive Status: No family/caregiver present to determine baseline cognitive functioning Area of Impairment: Attention;Problem solving                   Current Attention Level: Sustained         Problem Solving: Slow processing;Requires verbal cues        General Comments General comments (skin integrity, edema, etc.): Pt with R foot wound drainage during session; RN notified and replacing dressing at end of session    Exercises     Assessment/Plan    PT Assessment Patient needs continued PT services  PT Problem List Decreased activity tolerance;Decreased balance;Decreased mobility;Decreased strength;Decreased knowledge of use of DME;Decreased knowledge of precautions       PT Treatment Interventions DME instruction;Gait training;Functional mobility training;Therapeutic activities;Therapeutic exercise;Balance training;Patient/family education;Wheelchair mobility training    PT Goals (Current goals can be found in the Care Plan section)  Acute Rehab PT Goals Patient Stated Goal: Return home PT Goal Formulation: With patient Time For Goal Achievement: 10/13/17 Potential to Achieve Goals: Good    Frequency Min 3X/week   Barriers to discharge        Co-evaluation               AM-PAC PT "6 Clicks" Daily Activity  Outcome Measure Difficulty turning over in bed (including adjusting bedclothes, sheets and blankets)?: None Difficulty moving from lying on back to sitting on the side of the bed? : None Difficulty sitting down on and standing up from a chair with arms (e.g., wheelchair, bedside commode, etc,.)?: A Little Help needed moving to and from a bed to chair (including a wheelchair)?: A Little Help needed walking in hospital room?: A Little Help  needed climbing 3-5 steps with a railing? : A Little 6 Click Score: 20    End of Session Equipment Utilized During Treatment: Gait belt Activity Tolerance: Patient tolerated treatment well Patient left: in bed;with call bell/phone within reach;with nursing/sitter in room Nurse Communication: Mobility status PT Visit Diagnosis: Other abnormalities of gait and mobility (R26.89)    Time: 9937-1696 PT Time Calculation (min) (ACUTE ONLY): 22 min   Charges:   PT Evaluation $PT Eval Moderate Complexity: 1 Mod     PT G Codes:       Mabeline Caras, PT, DPT Acute Rehab Services  Pager: [place] 09/29/2017, 5:32 PM

## 2017-09-29 NOTE — Anesthesia Procedure Notes (Signed)
Procedure Name: MAC Date/Time: 09/29/2017 11:26 AM Performed by: Candis Shine, CRNA Pre-anesthesia Checklist: Patient identified, Emergency Drugs available, Suction available, Patient being monitored and Timeout performed Patient Re-evaluated:Patient Re-evaluated prior to induction Oxygen Delivery Method: Simple face mask Dental Injury: Teeth and Oropharynx as per pre-operative assessment

## 2017-09-29 NOTE — Op Note (Signed)
    NAME: Charlotte [name]    MRN: [id number] DOB: 18-May-1941    DATE OF OPERATION: 09/29/2017  PREOP DIAGNOSIS:    Nonhealing wound right fifth toe  POSTOP DIAGNOSIS:    Osteomyelitis right fifth toe  PROCEDURE:    Ray amputation of right fifth toe and placement of VAC  SURGEON: Judeth Cornfield. Scot Dock, MD, FACS  ASSIST: None  ANESTHESIA: Popliteal block  EBL: Minimal  INDICATIONS:    Charlotte [name] is a 77 y.o. female who is undergone revascularization of the right lower extremity.  She has a nonhealing wound on the lateral aspect of her right foot and presents for debridement and possible right fifth toe amputation.  FINDINGS:   There was exposed bone within the wound which was likely infected and the patient required open amputation of the right fifth toe.  The wound was extensive enough that I felt a negative pressure dressing was the best chance for getting this wound to heal.  TECHNIQUE:   The patient was taken to the operating room after a popliteal block was placed by anesthesia.  The right foot was prepped and draped in usual sterile fashion.  There was exposed bone and this was debrided.  For this reason there was really no other option but to do ray amputation of the right fifth toe.  An elliptical incision was made encompassing the toe and the periosteum elevated.  The metatarsal was divided using the CD 4 saw.  Hemostasis was obtained in the wound.  There was no way to close this given the extent of the wound and the amount of tension that this would require.  Therefore I elected to place a negative pressure dressing.  After hemostasis was obtained.  The VAC was placed without difficulty and there was a good seal.  The patient was transferred to the recovery room in stable condition.  All needle and sponge counts were correct.  [name], MD, FACS [facility]  DATE OF DICTATION:   09/29/2017

## 2017-09-29 NOTE — Progress Notes (Signed)
Patient received from PACU to 4E15, A&Ox3, VSS. Telemetry applied and CCMD notified. Oriented to room and call light within reach. Denies any needs at this time.

## 2017-09-29 NOTE — Progress Notes (Signed)
Orthopedic Tech Progress Note Patient Details:  Charlotte [name] Jul 06, 1941 [id number]  Ortho Devices Type of Ortho Device: Darco shoe Ortho Device/Splint Location: RLE Ortho Device/Splint Interventions: Criss Alvine 09/29/2017, 6:44 PM

## 2017-09-29 NOTE — Transfer of Care (Signed)
Immediate Anesthesia Transfer of Care Note  Patient: Charlotte [name]  Procedure(s) Performed: DEBRIDEMENT TOE AMPUTATION RIGHT FIFTH TOE (Right Foot) APPLICATION OF WOUND VAC ON RIGHT FOOT (Right Foot)  Patient Location: PACU  Anesthesia Type:MAC combined with regional for post-op pain  Level of Consciousness: awake, alert  and oriented  Airway & Oxygen Therapy: Patient Spontanous Breathing and Patient connected to nasal cannula oxygen  Post-op Assessment: Report given to RN and Post -op Vital signs reviewed and stable  Post vital signs: Reviewed and stable  Last Vitals:  Vitals:   09/29/17 0816 09/29/17 1207  BP:    Pulse:    Resp:    Temp:  (P) 36.5 C  SpO2: 95%     Last Pain:  Vitals:   09/29/17 0820  TempSrc:   PainSc: 5       Patients Stated Pain Goal: 4 (02/40/97 3532)  Complications: No apparent anesthesia complications

## 2017-09-29 NOTE — Care Management Note (Signed)
Case Management Note  Patient Details  Name: Charlotte [name] MRN: [id number] Date of Birth: [birth year]  Subjective/Objective:                 Patient admitted from home for toe amputation in the setting of osteo. Initially had VAC post op but it has been changed to W/D dsg.    Action/Plan:  CM will continue to follow.  Expected Discharge Date:                  Expected Discharge Plan:  [place]  In-House Referral:     Discharge planning Services  CM Consult  Post Acute Care Choice:    Choice offered to:     DME Arranged:    DME Agency:     HH Arranged:    [place] Agency:     Status of Service:  In process, will continue to follow  If discussed at Long Length of Stay Meetings, dates discussed:    Additional Comments:  Carles Collet, RN 09/29/2017, 3:43 PM

## 2017-09-29 NOTE — Progress Notes (Signed)
Wound vac alarm ringing "blockage". Wound was bleeding out around dressing. Paged Dr.Dickson and was given verbal instruction to removed wound vac and sponge and replace with a wet to dry dressing. Will continue to monitor.

## 2017-09-29 NOTE — Anesthesia Postprocedure Evaluation (Signed)
Anesthesia Post Note  Patient: Charlotte [name]  Procedure(s) Performed: DEBRIDEMENT TOE AMPUTATION RIGHT FIFTH TOE (Right Foot) APPLICATION OF WOUND VAC ON RIGHT FOOT (Right Foot)     Patient location during evaluation: PACU Anesthesia Type: Regional Level of consciousness: awake and alert Pain management: pain level controlled Vital Signs Assessment: post-procedure vital signs reviewed and stable Respiratory status: spontaneous breathing, nonlabored ventilation, respiratory function stable and patient connected to nasal cannula oxygen Cardiovascular status: stable and blood pressure returned to baseline Postop Assessment: no apparent nausea or vomiting Anesthetic complications: no    Last Vitals:  Vitals:   09/29/17 1540 09/29/17 2024  BP:  (!) 95/56  Pulse:  81  Resp:  16  Temp: 36.4 C 36.9 C  SpO2:  98%    Last Pain:  Vitals:   09/29/17 2024  TempSrc: Oral  PainSc: 0-No pain                 Nolon Nations

## 2017-09-29 NOTE — Anesthesia Procedure Notes (Signed)
Anesthesia Regional Block: Popliteal block   Pre-Anesthetic Checklist: ,, timeout performed, Correct Patient, Correct Site, Correct Laterality, Correct Procedure, Correct Position, site marked, Risks and benefits discussed,  Surgical consent,  Pre-op evaluation,  At surgeon's request and post-op pain management  Laterality: Right  Prep: chloraprep       Needles:  Injection technique: Single-shot  Needle Type: Stimiplex     Needle Length: 10cm  Needle Gauge: 21     Additional Needles:   Procedures:,,,, ultrasound used (permanent image in chart),,,,  Motor weakness within 5 minutes.  Narrative:  Start time: 09/29/2017 11:03 AM End time: 09/29/2017 11:06 AM Injection made incrementally with aspirations every 5 mL.  Performed by: Personally  Anesthesiologist: Nolon Nations, MD  Additional Notes: Nerve located and needle positioned with direct ultrasound guidance. Good perineural spread. Patient tolerated well.

## 2017-09-29 NOTE — Anesthesia Preprocedure Evaluation (Signed)
Anesthesia Evaluation    Airway Mallampati: I  TM Distance: >3 FB     Dental  (+) Teeth Intact   Pulmonary shortness of breath (pleural effusion), COPD,  COMPARISON:  Single-view of the chest 06/09/2017.  FINDINGS: Moderate to moderately large pleural effusions and basilar airspace disease, worse on the left, are unchanged. No pneumothorax. Left PICC present on the prior study has been removed. No acute bony Abnormality.   (Nov 2018)    + decreased breath sounds (left side)      Cardiovascular hypertension, Pt. on medications + Peripheral Vascular Disease, +CHF and + Orthopnea   Rhythm:Irregular Rate:Normal  - The patient was in atrial fibrillation. Normal LV size with EF   60-65%.   (2018)   Neuro/Psych    GI/Hepatic GERD  Medicated and Controlled,  Endo/Other  Hypothyroidism   Renal/GU CRFRenal diseaseResults for Charlotte, [name] (MRN [id number]) as of 08/31/2017 09:11  08/19/2017 09:37 Sodium: 136 Potassium: 3.7 Chloride: 93 (L) Glucose: 91 BUN: 41 (H) Creatinine: 1.40 (H)      Musculoskeletal  (+) Arthritis ,   Abdominal   Peds  Hematology  (+) anemia ,   Anesthesia Other Findings   Reproductive/Obstetrics                             Anesthesia Physical  Anesthesia Plan  ASA: IV  Anesthesia Plan: Regional   Post-op Pain Management:    Induction:   PONV Risk Score and Plan: 2 and Ondansetron  Airway Management Planned:   Additional Equipment:   Intra-op Plan:   Post-operative Plan:   Informed Consent: I have reviewed the patients History and Physical, chart, labs and discussed the procedure including the risks, benefits and alternatives for the proposed anesthesia with the patient or authorized representative who has indicated his/her understanding and acceptance.   Dental advisory given  Plan Discussed with: CRNA  Anesthesia Plan Comments:          Anesthesia Quick Evaluation

## 2017-09-30 ENCOUNTER — Encounter (HOSPITAL_COMMUNITY): Payer: Self-pay | Admitting: Vascular Surgery

## 2017-09-30 ENCOUNTER — Other Ambulatory Visit: Payer: Self-pay

## 2017-09-30 MED ORDER — WARFARIN - PHARMACIST DOSING INPATIENT: Freq: Every day | Status: DC

## 2017-09-30 MED ORDER — WARFARIN SODIUM 5 MG PO TABS
5.0000 mg | ORAL_TABLET | Freq: Once | ORAL | Status: AC
Start: 2017-09-30 — End: 2017-09-30
  Administered 2017-09-30: 5 mg via ORAL
  Filled 2017-09-30: qty 1

## 2017-09-30 NOTE — Care Management Note (Signed)
Case Management Note [name] RN, BSN Unit 4E-Case Manager [phone]  Patient Details  Name: Charlotte [name] MRN: [id number] Date of Birth: 07-Mar-1941  Subjective/Objective:  Pt admitted s/p toe amputation with wound VAC placement                  Action/Plan: PTA pt lived at home- was active with [facility] for [facility]- recommendation for HHPT on this admission- will need resumption orders for HHRN/PT on discharge. Will also need home wound VAC- [facility] order form has been signed- call made to Butch Penny with [facility] who will start process for approval- once approved- will be delivered and placed on pt at the bedside.   Expected Discharge Date:                  Expected Discharge Plan:  [place]  In-House Referral:     Discharge planning Services  CM Consult  Post Acute Care Choice:  Durable Medical Equipment, Home Health Choice offered to:  Patient  DME Arranged:  Vac DME Agency:  [place]:    [place] Agency:  [facility]  Status of Service:  In process, will continue to follow  If discussed at Long Length of Stay Meetings, dates discussed:    Discharge Disposition: home/home health   Additional Comments:  Dawayne Patricia, RN 09/30/2017, 2:23 PM

## 2017-09-30 NOTE — Progress Notes (Signed)
Pt up in recliner for 5 hours. A+1 with walker for transfers. Tolerated well. Will continue to monitor.   Clyde Canterbury, RN

## 2017-09-30 NOTE — Progress Notes (Signed)
  Progress Note    09/30/2017 8:06 AM 1 Day Post-Op  Subjective:  No acute issues  Vitals:   09/29/17 2024 09/30/17 0401  BP: (!) 95/56 121/72  Pulse: 81 97  Resp: 16 13  Temp: 98.5 F (36.9 C) 98.4 F (36.9 C)  SpO2: 98% 97%    Physical Exam: aaox3 Right foot dressing changed, wound base is healthy with bleeding from skin edges  CBC    Component Value Date/Time   WBC 8.5 09/29/2017 1616   RBC 3.30 (L) 09/29/2017 1616   HGB 8.5 (L) 09/29/2017 1616   HCT 29.1 (L) 09/29/2017 1616   PLT 320 09/29/2017 1616   MCV 88.2 09/29/2017 1616   MCH 25.8 (L) 09/29/2017 1616   MCHC 29.2 (L) 09/29/2017 1616   RDW 23.0 (H) 09/29/2017 1616   LYMPHSABS 1.0 06/27/2017 1742   MONOABS 0.2 06/27/2017 1742   EOSABS 0.2 06/27/2017 1742   BASOSABS 0.2 (H) 06/27/2017 1742    BMET    Component Value Date/Time   NA 139 09/29/2017 1616   K 3.2 (L) 09/29/2017 1616   CL 108 09/29/2017 1616   CO2 22 09/29/2017 1616   GLUCOSE 96 09/29/2017 1616   BUN 16 09/29/2017 1616   CREATININE 1.00 09/29/2017 1616   CREATININE 1.21 (H) 08/14/2016 0733   CALCIUM 8.1 (L) 09/29/2017 1616   GFRNONAA 54 (L) 09/29/2017 1616   GFRAA >60 09/29/2017 1616    INR    Component Value Date/Time   INR 1.85 09/29/2017 1616     Intake/Output Summary (Last 24 hours) at 09/30/2017 0806 Last data filed at 09/30/2017 0424 Gross per 24 hour  Intake 618.83 ml  Output 605 ml  Net 13.83 ml     Assessment:  77 y.o. female is s/p amputation of right 5th toe. Vac was removed for bleeding that appears to be controlled now  Plan: Restart coumadin Replace vac Set up for home vac with hopeful d/c tomorrow   Erlene Quan C. [name], MD [facility] [place] Office: [phone] Pager: [phone]  09/30/2017 8:06 AM

## 2017-09-30 NOTE — Progress Notes (Signed)
Physical Therapy Treatment Patient Details Name: Charlotte [name] MRN: [id number] DOB: June 10, 1941 Today's Date: 09/30/2017    History of Present Illness Pt is a 77 y.o. 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 PMH includes RLE revascularization, PVD, peripheral neuropathy, HTN, COPD, CHF, a-fib, anxiety.    PT Comments    Pt performed increased activity as darco shoe arrived and is present in room.  Pt required cues for safety with gait during darco shoe use.  Pt is limited due to decreased functional capacity secondary to poor ability to maintain O2 sats.  Plan to return home continues to be appropriate as she has all DME and she was using O2 at home.  Plan to progress mobility to tolerance next session.     Follow Up Recommendations  Home health PT;Supervision for mobility/OOB     Equipment Recommendations  None recommended by PT(owns DME)    Recommendations for Other Services       Precautions / Restrictions Precautions Precautions: Fall Restrictions Weight Bearing Restrictions: No RLE Weight Bearing: Partial weight bearing RLE Partial Weight Bearing Percentage or Pounds: PWB through heel only in Darco shoe    Mobility  Bed Mobility Overal bed mobility: Needs Assistance Bed Mobility: Supine to Sit     Supine to sit: Supervision     General bed mobility comments: For lines and leads, patient with no safety awareness of wound vac lines during transfer OOB, Pt impulsive to transfer before leads were situated.    Transfers Overall transfer level: Needs assistance Equipment used: Rolling walker (2 wheeled) Transfers: Sit to/from [facility] Sit to Stand: Supervision Stand pivot transfers: Min assist       General transfer comment: Able to stand from bed and BSC with RW and supervision for safety. Cues to maintain RLE WB through heel only.  Pt required cues for hand placement.  Pt performed from bed and from [facility],  stand pivot performed without device.    Ambulation/Gait Ambulation/Gait assistance: Min guard Ambulation Distance (Feet): 20 Feet Assistive device: Rolling walker (2 wheeled) Gait Pattern/deviations: Step-to pattern;Antalgic Gait velocity: Decreased Gait velocity interpretation: Below normal speed for age/gender General Gait Details: Cues to keep weight back on heel as she attempts to toe off.  Pt desaturated on 3L and required 4L during activity.  ON 4L she continues to desat to 80s with cues for pursed lip breathing.     Stairs            Wheelchair Mobility    Modified Rankin (Stroke Patients Only)       Balance Overall balance assessment: Needs assistance   Sitting balance-Leahy Scale: Good       Standing balance-Leahy Scale: Poor Standing balance comment: Reliant on UE support                            Cognition Arousal/Alertness: Awake/alert Behavior During Therapy: Flat affect Overall Cognitive Status: No family/caregiver present to determine baseline cognitive functioning Area of Impairment: Attention;Problem solving                   Current Attention Level: Sustained         Problem Solving: Slow processing;Requires verbal cues        Exercises      General Comments        Pertinent Vitals/Pain Pain Assessment: Faces Faces Pain Scale: Hurts a little bit Pain Location:  R foot incision Pain Intervention(s): Monitored during session;Repositioned    Home Living                      Prior Function            PT Goals (current goals can now be found in the care plan section) Acute Rehab PT Goals Patient Stated Goal: Return home Potential to Achieve Goals: Good Progress towards PT goals: Progressing toward goals    Frequency    Min 3X/week      PT Plan Current plan remains appropriate    Co-evaluation              AM-PAC PT "6 Clicks" Daily Activity  Outcome Measure  Difficulty turning  over in bed (including adjusting bedclothes, sheets and blankets)?: A Little Difficulty moving from lying on back to sitting on the side of the bed? : A Little Difficulty sitting down on and standing up from a chair with arms (e.g., wheelchair, bedside commode, etc,.)?: A Little Help needed moving to and from a bed to chair (including a wheelchair)?: A Little Help needed walking in hospital room?: A Little Help needed climbing 3-5 steps with a railing? : A Little 6 Click Score: 18    End of Session Equipment Utilized During Treatment: Gait belt Activity Tolerance: Patient tolerated treatment well Patient left: in bed;with call bell/phone within reach;with nursing/sitter in room Nurse Communication: Mobility status PT Visit Diagnosis: Other abnormalities of gait and mobility (R26.89)     Time: 6301-6010 PT Time Calculation (min) (ACUTE ONLY): 34 min  Charges:  $Gait Training: 8-22 mins $Therapeutic Activity: 8-22 mins                    G Codes:       Governor Rooks, PTA pager [place] 09/30/2017, 5:08 PM

## 2017-09-30 NOTE — Plan of Care (Signed)
  Progressing Education: Knowledge of General Education information will improve 09/30/2017 2238 - Progressing by Blair Promise, RN Health Behavior/Discharge Planning: Ability to manage health-related needs will improve 09/30/2017 2238 - Progressing by Blair Promise, RN Clinical Measurements: Ability to maintain clinical measurements within normal limits will improve 09/30/2017 2238 - Progressing by Blair Promise, RN Will remain free from infection 09/30/2017 2238 - Progressing by Blair Promise, RN Diagnostic test results will improve 09/30/2017 2238 - Progressing by Blair Promise, RN Respiratory complications will improve 09/30/2017 2238 - Progressing by Blair Promise, RN Cardiovascular complication will be avoided 09/30/2017 2238 - Progressing by Blair Promise, RN Activity: Risk for activity intolerance will decrease 09/30/2017 2238 - Progressing by Blair Promise, RN Nutrition: Adequate nutrition will be maintained 09/30/2017 2238 - Progressing by Blair Promise, RN Coping: Level of anxiety will decrease 09/30/2017 2238 - Progressing by Blair Promise, RN Elimination: Will not experience complications related to bowel motility 09/30/2017 2238 - Progressing by Blair Promise, RN Will not experience complications related to urinary retention 09/30/2017 2238 - Progressing by Blair Promise, RN Pain Managment: General experience of comfort will improve 09/30/2017 2238 - Progressing by Blair Promise, RN Safety: Ability to remain free from injury will improve 09/30/2017 2238 - Progressing by Blair Promise, RN Skin Integrity: Risk for impaired skin integrity will decrease 09/30/2017 2238 - Progressing by Blair Promise, RN

## 2017-09-30 NOTE — Progress Notes (Signed)
ANTICOAGULATION CONSULT NOTE - Initial Consult  Pharmacy Consult for Coumadin Indication: atrial fibrillation  No Known Allergies  Patient Measurements: Height: 6' (182.9 cm) Weight: 137 lb 9.1 oz (62.4 kg) IBW/kg (Calculated) : 73.1  Vital Signs: Temp: 97.9 F (36.6 C) (02/27 1009) Temp Source: Oral (02/27 1009) BP: 112/70 (02/27 1009) Pulse Rate: 97 (02/27 0401)  Labs: Recent Labs    09/29/17 0818 09/29/17 1616  HGB 9.3* 8.5*  HCT 32.0* 29.1*  PLT 366 320  APTT 61*  --   LABPROT 22.0* 21.2*  INR 1.94 1.85  CREATININE 1.06* 1.00    Estimated Creatinine Clearance: 48.6 mL/min (by C-G formula based on SCr of 1 mg/dL).   Medical History: Past Medical History:  Diagnosis Date  . Anxiety   . Arthritis   . Atrial fibrillation, chronic (HCC)    a. on Coumadin for anticoagulation.   . Cellulitis 06/29/2017   RIGHT LOWER LEG  . CHF (congestive heart failure) ([place])    combined  . COPD (chronic obstructive pulmonary disease) ([place])    on home O2 4 lpm  . Deaf   . 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 DVT (deep venous thrombosis) ([place])   . Essential hypertension   . GERD (gastroesophageal reflux disease)   . HOH (hard of hearing)   . Hyperlipidemia   . Hypothyroidism   . Oxygen dependent    4 lpm  . Peripheral neuropathy   . Peripheral vascular disease ([place])   . 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 Ulcer of ankle ([place])    Bilateral 2015  . Varicose veins    Assessment: CC/HPI:  Nonhealing wound right fifth toe presents for debridement and possible right fifth toe amputation.  PMH: history of right femoral to TP trunk bypass with vein 8/18, R medial leg wound, R 4th/5th toe gangrene, afib, anxiety, arthritis, R LE cellulitis, CHF, COPD, deaf, h/o DVT, HTN, GERD, HLD, hypothyroid, peripheral neuropathy, PVD, polycythemia vera, varicose veins.  Anticoag: Lovenox 40mg /d. In report RN says keeps bleeding through dressing. Restart Coumadin today. Hgb down to 8.5  post-procedure. Plts 320. INR 1.85 today. - Home dose 5mg  Mon, 2.5mg  all other days.  Goal of Therapy:  INR 2-3 Monitor platelets by anticoagulation protocol: Yes   Plan:  Coumadin 5mg  po x 1 tonight Daily INR  Aija Scarfo S. Alford Highland, PharmD, BCPS Clinical Staff Pharmacist Pager [phone]  Eilene Ghazi Stillinger 09/30/2017,12:09 PM

## 2017-09-30 NOTE — Discharge Instructions (Signed)
Information on my medicine - Coumadin®   (Warfarin) ° °This medication education was reviewed with me or my healthcare representative as part of my discharge preparation.  The pharmacist that spoke with me during my hospital stay was:  Lael Pilch Stillinger, RPH ° °Why was Coumadin prescribed for you? °Coumadin was prescribed for you because you have a blood clot or a medical condition that can cause an increased risk of forming blood clots. Blood clots can cause serious health problems by blocking the flow of blood to the heart, lung, or brain. Coumadin can prevent harmful blood clots from forming. °As a reminder your indication for Coumadin is:   Stroke Prevention Because Of Atrial Fibrillation ° °What test will check on my response to Coumadin? °While on Coumadin (warfarin) you will need to have an INR test regularly to ensure that your dose is keeping you in the desired range. The INR (international normalized ratio) number is calculated from the result of the laboratory test called prothrombin time (PT). ° °If an INR APPOINTMENT HAS NOT ALREADY BEEN MADE FOR YOU please schedule an appointment to have this lab work done by your health care provider within 7 days. °Your INR goal is usually a number between:  2 to 3 or your provider may give you a more narrow range like 2-2.5.  Ask your health care provider during an office visit what your goal INR is. ° °What  do you need to  know  About  COUMADIN? °Take Coumadin (warfarin) exactly as prescribed by your healthcare provider about the same time each day.  DO NOT stop taking without talking to the doctor who prescribed the medication.  Stopping without other blood clot prevention medication to take the place of Coumadin may increase your risk of developing a new clot or stroke.  Get refills before you run out. ° °What do you do if you miss a dose? °If you miss a dose, take it as soon as you remember on the same day then continue your regularly scheduled  regimen the next day.  Do not take two doses of Coumadin at the same time. ° °Important Safety Information °A possible side effect of Coumadin (Warfarin) is an increased risk of bleeding. You should call your healthcare provider right away if you experience any of the following: °  Bleeding from an injury or your nose that does not stop. °  Unusual colored urine (red or dark brown) or unusual colored stools (red or black). °  Unusual bruising for unknown reasons. °  A serious fall or if you hit your head (even if there is no bleeding). ° °Some foods or medicines interact with Coumadin® (warfarin) and might alter your response to warfarin. To help avoid this: °  Eat a balanced diet, maintaining a consistent amount of Vitamin K. °  Notify your provider about major diet changes you plan to make. °  Avoid alcohol or limit your intake to 1 drink for women and 2 drinks for men per day. °(1 drink is 5 oz. wine, 12 oz. beer, or 1.5 oz. liquor.) ° °Make sure that ANY health care provider who prescribes medication for you knows that you are taking Coumadin (warfarin).  Also make sure the healthcare provider who is monitoring your Coumadin knows when you have started a new medication including herbals and non-prescription products. ° °Coumadin® (Warfarin)  Major Drug Interactions  °Increased Warfarin Effect Decreased Warfarin Effect  °Alcohol (large quantities) °Antibiotics (esp. Septra/Bactrim, Flagyl, Cipro) °Amiodarone (Cordarone) °Aspirin (  ASA) °Cimetidine (Tagamet) °Megestrol (Megace) °NSAIDs (ibuprofen, naproxen, etc.) °Piroxicam (Feldene) °Propafenone (Rythmol SR) °Propranolol (Inderal) °Isoniazid (INH) °Posaconazole (Noxafil) Barbiturates (Phenobarbital) °Carbamazepine (Tegretol) °Chlordiazepoxide (Librium) °Cholestyramine (Questran) °Griseofulvin °Oral Contraceptives °Rifampin °Sucralfate (Carafate) °Vitamin K  ° °Coumadin® (Warfarin) Major Herbal Interactions  °Increased Warfarin Effect Decreased Warfarin Effect    °Garlic °Ginseng °Ginkgo biloba Coenzyme Q10 °Green tea °St. John’s wort   ° °Coumadin® (Warfarin) FOOD Interactions  °Eat a consistent number of servings per week of foods HIGH in Vitamin K °(1 serving = ½ cup)  °Collards (cooked, or boiled & drained) °Kale (cooked, or boiled & drained) °Mustard greens (cooked, or boiled & drained) °Parsley *serving size only = ¼ cup °Spinach (cooked, or boiled & drained) °Swiss chard (cooked, or boiled & drained) °Turnip greens (cooked, or boiled & drained)  °Eat a consistent number of servings per week of foods MEDIUM-HIGH in Vitamin K °(1 serving = 1 cup)  °Asparagus (cooked, or boiled & drained) °Broccoli (cooked, boiled & drained, or raw & chopped) °Brussel sprouts (cooked, or boiled & drained) *serving size only = ½ cup °Lettuce, raw (green leaf, endive, romaine) °Spinach, raw °Turnip greens, raw & chopped  ° °These websites have more information on Coumadin (warfarin):  www.coumadin.com; °www.ahrq.gov/consumer/coumadin.htm; ° ° ° °

## 2017-09-30 NOTE — Consult Note (Addendum)
[facility] Nurse wound consult note Reason for Consult: VAC placement to right lateral foot amputation site Wound type: Open surgical wound  Measurement: 6.5 cm x 3.5 cm x 2.5 cm Wound bed:  100% pink granulation tissue. Drainage (amount, consistency, odor) Minimal bleeding upon removal of existing gauze dressing.  Scant bloody drainage observed in Trac pad tubing upon connection to VAC.  Placed on continuous suction at 143mmHg.  Patient tolerated the procedure very well and stated, "it doesn' hurt".   Periwound: Intact, normal color and texture Dressing procedure/placement/frequency:  VAC NPWT for home VAC placement prior to d/c. Primary RN can convert to home [facility] when patient is ready for d/c. Discussed POC with patient and bedside nurse.  Re consult if needed, will not follow at this time. Thank you,  Val Riles MSN,RN,CWOCN,CNS-BC, [phone])

## 2017-10-01 LAB — PROTIME-INR
INR: 1.66
Prothrombin Time: 19.5 seconds — ABNORMAL HIGH (ref 11.4–15.2)

## 2017-10-01 MED ORDER — SULFAMETHOXAZOLE-TRIMETHOPRIM 400-80 MG PO TABS: 1.0000 | ORAL_TABLET | Freq: Two times a day (BID) | ORAL | 0 refills | Status: AC

## 2017-10-01 MED ORDER — OXYCODONE-ACETAMINOPHEN 5-325 MG PO TABS: 1.0000 | ORAL_TABLET | Freq: Four times a day (QID) | ORAL | 0 refills | Status: DC | PRN

## 2017-10-01 MED ORDER — WARFARIN SODIUM 5 MG PO TABS: 5.0000 mg | ORAL_TABLET | Freq: Once | ORAL | Status: DC

## 2017-10-01 NOTE — Progress Notes (Signed)
Discharge instructions given to patient and husband. Home wound vac placed. Prescriptions given to pt. Iv removed, clean and intact. Home O2 brought here for patient to use on ride home.  Clyde Canterbury, RN

## 2017-10-01 NOTE — H&P (Signed)
Patient NG:[id number] H Horsfall,femaleDOB:1941/02/09,77 y.o.GMW:[id number]  Reason for Consult:Follow-up (3-4 wk f/u )  Referred byGolding, John, MD  Subjective:   HPI:  CHARISMA CHARLOT a 77 y.o.femalewith history of right femoral to TP trunk bypass with vein in August of this year. She initially had a long hospital stay went to rehab after this. She is now recovered she is at home. She does take Coumadin for her atrial fibrillation. She is also on aspirin and statin. She has home health care for a wound on her right medial leg from the distal anastomosis incision and also her right fourth and fifth toes gangrene. She is having some pain in the leg but she is able to walk at this time. Otherwise she does have significant shortness of breath as well as leg swelling bilaterally and she is wearing oxygen at home      Past Medical History:  Diagnosis Date  . Anxiety   . Arthritis   . Atrial fibrillation, chronic (HCC)    a. on Coumadin for anticoagulation.   . Cellulitis 06/29/2017   RIGHT LOWER LEG  . CHF (congestive heart failure) ([place])    combined  . COPD (chronic obstructive pulmonary disease) ([place])    on home O2  . Deaf   . 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 DVT (deep venous thrombosis) ([place])   . Essential hypertension   . GERD (gastroesophageal reflux disease)   . Hyperlipidemia   . Hypothyroidism   . Peripheral neuropathy   . Peripheral vascular disease ([place])   . 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 Ulcer of ankle ([place])    Bilateral 2015  . Varicose veins         Family History  Problem Relation Age of Onset  . Heart failure Mother   . Heart disease Mother   . Stroke Father   . Heart disease Sister   . Diabetes Son   . Hypertension Sister   . Hypertension Sister   . Stroke Sister         Past Surgical History:  Procedure Laterality Date  . ABDOMINAL AORTAGRAM N/A 09/14/2012   Procedure:  ABDOMINAL [name]; Surgeon: Serafina Mitchell, MD; Location: [facility] CATH LAB; Service: Cardiovascular; Laterality: N/A;  . ABDOMINAL AORTOGRAM N/A 03/06/2017   Procedure: ABDOMINAL AORTOGRAM; Surgeon: Elam Dutch, MD; Location: [place] CV LAB; Service: Cardiovascular; Laterality: N/A;  . ABDOMINAL HYSTERECTOMY    . CORONARY ANGIOPLASTY    . DRESSING CHANGE UNDER ANESTHESIA  12/02/2011   Procedure: DRESSING CHANGE UNDER ANESTHESIA; Surgeon: Carole Civil, MD; Location: [facility]; Service: Orthopedics; Laterality: Right;  . ENDARTERECTOMY FEMORAL Right 09/15/2012   Procedure: ENDARTERECTOMY FEMORAL; Surgeon: Mal Misty, MD; Location: [place]; Service: Vascular; Laterality: Right;  . FASCIOTOMY  11/29/2011   Procedure: FASCIOTOMY; Surgeon: Carole Civil, MD; Location: [facility]; Service: Orthopedics; Laterality: Right; right thigh   . FEMORAL-POPLITEAL BYPASS GRAFT Right 09/15/2012   Procedure: BYPASS GRAFT FEMORAL-POPLITEAL ARTERY; Surgeon: Mal Misty, MD; Location: [place]; Service: Vascular; Laterality: Right;  . FEMORAL-TIBIAL BYPASS GRAFT Right 03/11/2017   Procedure: RIGHT FEMORAL-TIBIAL BYPASS IN-SITU GREATER SAPHENOUS VEIN; Surgeon: Waynetta Sandy, MD; Location: [facility]; Service: Vascular; Laterality: Right;  . HIP PINNING,CANNULATED  11/19/2011   Procedure: CANNULATED HIP PINNING; Surgeon: Sanjuana Kava, MD; Location: [facility]; Service: Orthopedics; Laterality: Right;  . LOWER EXTREMITY ANGIOGRAPHY Bilateral 03/06/2017   Procedure: Lower Extremity Angiography; Surgeon: Elam Dutch, MD; Location: [place] CV LAB; Service: Cardiovascular;  Laterality: Bilateral;  . PATCH ANGIOPLASTY Right 09/15/2012   Procedure: PATCH ANGIOPLASTY; Surgeon: Mal Misty, MD; Location: [employer]; Service: Vascular; Laterality: Right;  . TUBAL LIGATION      Short Social History: Social History        Tobacco Use  . Smoking  status: Never Smoker  . Smokeless tobacco: Never Used  Substance Use Topics  . Alcohol use: No    Alcohol/week: 0.0 oz    No Known Allergies        Current Outpatient Medications  Medication Sig Dispense Refill  . acetaminophen (TYLENOL) 325 MG tablet Take 2 tablets (650 mg total) by mouth every 6 (six) hours as needed for mild pain or fever.    Marland Kitchen aspirin EC 81 MG EC tablet Take 1 tablet (81 mg total) by mouth daily.    . cephALEXin (KEFLEX) 500 MG capsule Take 1 capsule (500 mg total) by mouth every 8 (eight) hours. 21 capsule 0  . diltiazem (CARDIZEM CD) 240 MG 24 hr capsule Take 240 mg by mouth daily.    . furosemide (LASIX) 40 MG tablet Take 1 tablet (40 mg total) by mouth every other day. 30 tablet   . gabapentin (NEURONTIN) 100 MG capsule Take 100 mg by mouth 3 (three) times daily.     . hydroxyurea (HYDREA) 500 MG capsule Take 2 capsules (1,000 mg total) by mouth daily. May take with food to minimize GI side effects. 60 capsule 2  . levothyroxine (SYNTHROID, LEVOTHROID) 75 MCG tablet Take 1 tablet (75 mcg total) by mouth daily before breakfast.    . metolazone (ZAROXOLYN) 2.5 MG tablet Take 2.5 mg by mouth every Monday, Wednesday, and Friday.    . ondansetron (ZOFRAN ODT) 4 MG disintegrating tablet 4mg  ODT q4 hours prn nausea/vomit 4 tablet 0  . oxyCODONE (OXY IR/ROXICODONE) 5 MG immediate release tablet Take 1 tablet (5 mg total) by mouth every 6 (six) hours as needed for moderate pain. 20 tablet 0  . pravastatin (PRAVACHOL) 10 MG tablet Take 1 tablet (10 mg total) by mouth daily. (Patient taking differently: Take 10 mg every evening by mouth. )    . warfarin (COUMADIN) 2.5 MG tablet Take 1 tablet (2.5 mg total) by mouth daily at 6 PM. Take one tablet all days except take 2.5mg  on MWF (Patient taking differently: Take 2.5-5 mg by mouth daily at 6 PM. Take 2.5 mg tablet every day except take 5 mg on Monday's.) 10 tablet 0   No current facility-administered  medications for this visit.    Review of Systems Constitutional:Constitutional negative. HENT:HENT negative.  Eyes:Eyes negative.  Respiratory: Positive forshortness of breath.  Cardiovascular: Positive fordyspnea with exertionand leg swelling.  MA:[id number] negative.  Musculoskeletal: Positive forleg pain.  Skin: Positive forwound.  Neurological: Positive fordizzinessand numbness.  Hematologic: Positive forbruises/bleeds easily.  Psychiatric:Psychiatric negative.    Objective:  Objective     Vitals:   07/17/17 1522  BP: 99/64  Pulse: 84  Resp: 18  Temp: 97.6 F (36.4 C)  TempSrc: Oral  SpO2: 97%  Weight: 159 lb (72.1 kg)  Height: 6' (1.829 m)   Body mass index is 21.56 kg/m.  Physical Exam Constitutional: She appearswell-developed.  HENT:  Head:Normocephalic.  Eyes:Pupils are equal, round, and reactive to light.  Neck:Normal range of motion.  Cardiovascular:Normal rate.  Pulses: Radial pulses are 2+on the right side, and 2+on the left side.  Femoral pulses are 2+on the right side, and 2+on the left side. Palpable pulse in right  bypass graft Monophasic signals in right dp/pt Abdominal:Soft. She exhibitsno mass.  Musculoskeletal:Normal range of motion. She exhibitsedema.  Neurological: She isalert.  Psychiatric: She has anormal mood and affect. Herbehavior is normal.   Data: I independently interpreted her right lower extremity duplex which demonstrates an elevated velocity of 206 near the knee with a ratio 3.5. The ABI is 0.67 with a toe pressure of 47 monophasic signals on the right PT and DP. On the left side she has triphasic waveforms with noncompressible ABIs and toe pressure 76.  Assessment/Plan:   77 year old female status post right femoral to TP trunk bypass. She has ulceration on the lateral aspect of her right foot. Will need angiogram to evaluate bypass. Toes actually look  better but ulcer is worse laterally and possibly bone is exposed. Will proceed with angiogram and possibly will need debridement and toe amputation while here given that coumadin has been held.    Georgia Dom CainMD [facility]   Patient ZO:XWRUEAVWU H Locicero,femaleDOB:1941/03/15,77 y.o.JWJ:[id number]  Reason for Consult:Follow-up (3-4 wk f/u )  Referred byGolding, John, MD  Subjective:   HPI:  WAVA KILDOW a 77 y.o.femalewith history of right femoral to TP trunk bypass with vein in August of this year. She initially had a long hospital stay went to rehab after this. She is now recovered she is at home. She does take Coumadin for her atrial fibrillation. She is also on aspirin and statin. She has home health care for a wound on her right medial leg from the distal anastomosis incision and also her right fourth and fifth toes gangrene. She is having some pain in the leg but she is able to walk at this time. Otherwise she does have significant shortness of breath as well as leg swelling bilaterally and she is wearing oxygen at home      Past Medical History:  Diagnosis Date  . Anxiety   . Arthritis   . Atrial fibrillation, chronic (HCC)    a. on Coumadin for anticoagulation.   . Cellulitis 06/29/2017   RIGHT LOWER LEG  . CHF (congestive heart failure) ([place])    combined  . COPD (chronic obstructive pulmonary disease) ([place])    on home O2  . Deaf   . 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 DVT (deep venous thrombosis) ([place])   . Essential hypertension   . GERD (gastroesophageal reflux disease)   . Hyperlipidemia   . Hypothyroidism   . Peripheral neuropathy   . Peripheral vascular disease ([place])   . 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 Ulcer of ankle ([place])    Bilateral 2015  . Varicose veins         Family History  Problem Relation Age of Onset  . Heart failure Mother   .  Heart disease Mother   . Stroke Father   . Heart disease Sister   . Diabetes Son   . Hypertension Sister   . Hypertension Sister   . Stroke Sister         Past Surgical History:  Procedure Laterality Date  . ABDOMINAL AORTAGRAM N/A 09/14/2012   Procedure: ABDOMINAL [name]; Surgeon: Serafina Mitchell, MD; Location: [facility] CATH LAB; Service: Cardiovascular; Laterality: N/A;  . ABDOMINAL AORTOGRAM N/A 03/06/2017   Procedure: ABDOMINAL AORTOGRAM; Surgeon: Elam Dutch, MD; Location: [place] CV LAB; Service: Cardiovascular; Laterality: N/A;  . ABDOMINAL HYSTERECTOMY    . CORONARY ANGIOPLASTY    . DRESSING CHANGE UNDER ANESTHESIA  12/02/2011   Procedure: DRESSING CHANGE UNDER ANESTHESIA; Surgeon: Carole Civil, MD; Location: [facility]; Service: Orthopedics; Laterality: Right;  . ENDARTERECTOMY FEMORAL Right 09/15/2012   Procedure: ENDARTERECTOMY FEMORAL; Surgeon: Mal Misty, MD; Location: [place]; Service: Vascular; Laterality: Right;  . FASCIOTOMY  11/29/2011   Procedure: FASCIOTOMY; Surgeon: Carole Civil, MD; Location: [facility]; Service: Orthopedics; Laterality: Right; right thigh   . FEMORAL-POPLITEAL BYPASS GRAFT Right 09/15/2012   Procedure: BYPASS GRAFT FEMORAL-POPLITEAL ARTERY; Surgeon: Mal Misty, MD; Location: [place]; Service: Vascular; Laterality: Right;  . FEMORAL-TIBIAL BYPASS GRAFT Right 03/11/2017   Procedure: RIGHT FEMORAL-TIBIAL BYPASS IN-SITU GREATER SAPHENOUS VEIN; Surgeon: Waynetta Sandy, MD; Location: [place]; Service: Vascular; Laterality: Right;  . HIP PINNING,CANNULATED  11/19/2011   Procedure: CANNULATED HIP PINNING; Surgeon: Sanjuana Kava, MD; Location: [facility]; Service: Orthopedics; Laterality: Right;  . LOWER EXTREMITY ANGIOGRAPHY Bilateral 03/06/2017   Procedure: Lower Extremity Angiography; Surgeon: Elam Dutch, MD; Location: [place] CV LAB; Service: Cardiovascular; Laterality:  Bilateral;  . PATCH ANGIOPLASTY Right 09/15/2012   Procedure: PATCH ANGIOPLASTY; Surgeon: Mal Misty, MD; Location: [place]; Service: Vascular; Laterality: Right;  . TUBAL LIGATION      Short Social History: Social History        Tobacco Use  . Smoking status: Never Smoker  . Smokeless tobacco: Never Used  Substance Use Topics  . Alcohol use: No    Alcohol/week: 0.0 oz    No Known Allergies        Current Outpatient Medications  Medication Sig Dispense Refill  . acetaminophen (TYLENOL) 325 MG tablet Take 2 tablets (650 mg total) by mouth every 6 (six) hours as needed for mild pain or fever.    Marland Kitchen aspirin EC 81 MG EC tablet Take 1 tablet (81 mg total) by mouth daily.    . cephALEXin (KEFLEX) 500 MG capsule Take 1 capsule (500 mg total) by mouth every 8 (eight) hours. 21 capsule 0  . diltiazem (CARDIZEM CD) 240 MG 24 hr capsule Take 240 mg by mouth daily.    . furosemide (LASIX) 40 MG tablet Take 1 tablet (40 mg total) by mouth every other day. 30 tablet   . gabapentin (NEURONTIN) 100 MG capsule Take 100 mg by mouth 3 (three) times daily.     . hydroxyurea (HYDREA) 500 MG capsule Take 2 capsules (1,000 mg total) by mouth daily. May take with food to minimize GI side effects. 60 capsule 2  . levothyroxine (SYNTHROID, LEVOTHROID) 75 MCG tablet Take 1 tablet (75 mcg total) by mouth daily before breakfast.    . metolazone (ZAROXOLYN) 2.5 MG tablet Take 2.5 mg by mouth every Monday, Wednesday, and Friday.    . ondansetron (ZOFRAN ODT) 4 MG disintegrating tablet 4mg  ODT q4 hours prn nausea/vomit 4 tablet 0  . oxyCODONE (OXY IR/ROXICODONE) 5 MG immediate release tablet Take 1 tablet (5 mg total) by mouth every 6 (six) hours as needed for moderate pain. 20 tablet 0  . pravastatin (PRAVACHOL) 10 MG tablet Take 1 tablet (10 mg total) by mouth daily. (Patient taking differently: Take 10 mg every evening by mouth. )    . warfarin (COUMADIN) 2.5 MG tablet  Take 1 tablet (2.5 mg total) by mouth daily at 6 PM. Take one tablet all days except take 2.5mg  on MWF (Patient taking differently: Take 2.5-5 mg by mouth daily at 6 PM. Take 2.5 mg tablet every day except take 5 mg on Monday's.) 10 tablet 0   No current  facility-administered medications for this visit.    Review of Systems Constitutional:Constitutional negative. HENT:HENT negative.  Eyes:Eyes negative.  Respiratory: Positive forshortness of breath.  Cardiovascular: Positive fordyspnea with exertionand leg swelling.  GM:[id number] negative.  Musculoskeletal: Positive forleg pain.  Skin: Positive forwound.  Neurological: Positive fordizzinessand numbness.  Hematologic: Positive forbruises/bleeds easily.  Psychiatric:Psychiatric negative.    Objective:  Objective     Vitals:   07/17/17 1522  BP: 99/64  Pulse: 84  Resp: 18  Temp: 97.6 F (36.4 C)  TempSrc: Oral  SpO2: 97%  Weight: 159 lb (72.1 kg)  Height: 6' (1.829 m)   Body mass index is 21.56 kg/m.  Physical Exam Constitutional: She appearswell-developed.  HENT:  Head:Normocephalic.  Eyes:Pupils are equal, round, and reactive to light.  Neck:Normal range of motion.  Cardiovascular:Normal rate.  Pulses: Radial pulses are 2+on the right side, and 2+on the left side.  Femoral pulses are 2+on the right side, and 2+on the left side. Palpable pulse in right bypass graft Monophasic signals in right dp/pt Abdominal:Soft. She exhibitsno mass.  Musculoskeletal:Normal range of motion. She exhibitsedema.  Neurological: She isalert.  Psychiatric: She has anormal mood and affect. Herbehavior is normal.   Data: I independently interpreted her right lower extremity duplex which demonstrates an elevated velocity of 206 near the knee with a ratio 3.5. The ABI is 0.67 with a toe pressure of 47 monophasic signals on the right PT and DP. On the left side she has  triphasic waveforms with noncompressible ABIs and toe pressure 76.  Assessment/Plan:   77 year old female status post right femoral to TP trunk bypass. She has ulceration on the lateral aspect of her right foot. Will need angiogram to evaluate bypass. Toes actually look better but ulcer is worse laterally and possibly bone is exposed. Will proceed with angiogram and possibly will need debridement and toe amputation while here given that coumadin has been held.    Georgia Dom CainMD [facility] [place] [facility]    Patient QQ:VZDGLOVFI H Schlabach,femaleDOB:02/14/1941,77 y.o.EPP:[id number]  Reason for Consult:Follow-up (3-4 wk f/u )  Referred byGolding, John, MD  Subjective:   HPI:  JODEEN MCLIN a 77 y.o.femalewith history of right femoral to TP trunk bypass with vein in August of this year. She initially had a long hospital stay went to rehab after this. She is now recovered she is at home. She does take Coumadin for her atrial fibrillation. She is also on aspirin and statin. She has home health care for a wound on her right medial leg from the distal anastomosis incision and also her right fourth and fifth toes gangrene. She is having some pain in the leg but she is able to walk at this time. Otherwise she does have significant shortness of breath as well as leg swelling bilaterally and she is wearing oxygen at home      Past Medical History:  Diagnosis Date  . Anxiety   . Arthritis   . Atrial fibrillation, chronic (HCC)    a. on Coumadin for anticoagulation.   . Cellulitis 06/29/2017   RIGHT LOWER LEG  . CHF (congestive heart failure) ([place])    combined  . COPD (chronic obstructive pulmonary disease) ([place])    on home O2  . Deaf   . 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 DVT (deep venous thrombosis) ([place])   . Essential hypertension   . GERD (gastroesophageal reflux disease)   . Hyperlipidemia   .  Hypothyroidism   .  Peripheral neuropathy   . Peripheral vascular disease ([place])   . 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 Ulcer of ankle ([place])    Bilateral 2015  . Varicose veins         Family History  Problem Relation Age of Onset  . Heart failure Mother   . Heart disease Mother   . Stroke Father   . Heart disease Sister   . Diabetes Son   . Hypertension Sister   . Hypertension Sister   . Stroke Sister         Past Surgical History:  Procedure Laterality Date  . ABDOMINAL AORTAGRAM N/A 09/14/2012   Procedure: ABDOMINAL [name]; Surgeon: Serafina Mitchell, MD; Location: [facility] CATH LAB; Service: Cardiovascular; Laterality: N/A;  . ABDOMINAL AORTOGRAM N/A 03/06/2017   Procedure: ABDOMINAL AORTOGRAM; Surgeon: Elam Dutch, MD; Location: [place] CV LAB; Service: Cardiovascular; Laterality: N/A;  . ABDOMINAL HYSTERECTOMY    . CORONARY ANGIOPLASTY    . DRESSING CHANGE UNDER ANESTHESIA  12/02/2011   Procedure: DRESSING CHANGE UNDER ANESTHESIA; Surgeon: Carole Civil, MD; Location: [facility]; Service: Orthopedics; Laterality: Right;  . ENDARTERECTOMY FEMORAL Right 09/15/2012   Procedure: ENDARTERECTOMY FEMORAL; Surgeon: Mal Misty, MD; Location: [place]; Service: Vascular; Laterality: Right;  . FASCIOTOMY  11/29/2011   Procedure: FASCIOTOMY; Surgeon: Carole Civil, MD; Location: [facility]; Service: Orthopedics; Laterality: Right; right thigh   . FEMORAL-POPLITEAL BYPASS GRAFT Right 09/15/2012   Procedure: BYPASS GRAFT FEMORAL-POPLITEAL ARTERY; Surgeon: Mal Misty, MD; Location: [place]; Service: Vascular; Laterality: Right;  . FEMORAL-TIBIAL BYPASS GRAFT Right 03/11/2017   Procedure: RIGHT FEMORAL-TIBIAL BYPASS IN-SITU GREATER SAPHENOUS VEIN; Surgeon: Waynetta Sandy, MD; Location: [place]; Service: Vascular; Laterality: Right;  . HIP PINNING,CANNULATED  11/19/2011   Procedure: CANNULATED HIP PINNING;  Surgeon: Sanjuana Kava, MD; Location: [facility]; Service: Orthopedics; Laterality: Right;  . LOWER EXTREMITY ANGIOGRAPHY Bilateral 03/06/2017   Procedure: Lower Extremity Angiography; Surgeon: Elam Dutch, MD; Location: [place] CV LAB; Service: Cardiovascular; Laterality: Bilateral;  . PATCH ANGIOPLASTY Right 09/15/2012   Procedure: PATCH ANGIOPLASTY; Surgeon: Mal Misty, MD; Location: [place]; Service: Vascular; Laterality: Right;  . TUBAL LIGATION      Short Social History: Social History        Tobacco Use  . Smoking status: Never Smoker  . Smokeless tobacco: Never Used  Substance Use Topics  . Alcohol use: No    Alcohol/week: 0.0 oz    No Known Allergies        Current Outpatient Medications  Medication Sig Dispense Refill  . acetaminophen (TYLENOL) 325 MG tablet Take 2 tablets (650 mg total) by mouth every 6 (six) hours as needed for mild pain or fever.    Marland Kitchen aspirin EC 81 MG EC tablet Take 1 tablet (81 mg total) by mouth daily.    . cephALEXin (KEFLEX) 500 MG capsule Take 1 capsule (500 mg total) by mouth every 8 (eight) hours. 21 capsule 0  . diltiazem (CARDIZEM CD) 240 MG 24 hr capsule Take 240 mg by mouth daily.    . furosemide (LASIX) 40 MG tablet Take 1 tablet (40 mg total) by mouth every other day. 30 tablet   . gabapentin (NEURONTIN) 100 MG capsule Take 100 mg by mouth 3 (three) times daily.     . hydroxyurea (HYDREA) 500 MG capsule Take 2 capsules (1,000 mg total) by mouth daily. May take with food to minimize GI side effects. 60 capsule 2  . levothyroxine (SYNTHROID, LEVOTHROID) 75 MCG  tablet Take 1 tablet (75 mcg total) by mouth daily before breakfast.    . metolazone (ZAROXOLYN) 2.5 MG tablet Take 2.5 mg by mouth every Monday, Wednesday, and Friday.    . ondansetron (ZOFRAN ODT) 4 MG disintegrating tablet 4mg  ODT q4 hours prn nausea/vomit 4 tablet 0  . oxyCODONE (OXY IR/ROXICODONE) 5 MG immediate release tablet  Take 1 tablet (5 mg total) by mouth every 6 (six) hours as needed for moderate pain. 20 tablet 0  . pravastatin (PRAVACHOL) 10 MG tablet Take 1 tablet (10 mg total) by mouth daily. (Patient taking differently: Take 10 mg every evening by mouth. )    . warfarin (COUMADIN) 2.5 MG tablet Take 1 tablet (2.5 mg total) by mouth daily at 6 PM. Take one tablet all days except take 2.5mg  on MWF (Patient taking differently: Take 2.5-5 mg by mouth daily at 6 PM. Take 2.5 mg tablet every day except take 5 mg on Monday's.) 10 tablet 0   No current facility-administered medications for this visit.    Review of Systems Constitutional:Constitutional negative. HENT:HENT negative.  Eyes:Eyes negative.  Respiratory: Positive forshortness of breath.  Cardiovascular: Positive fordyspnea with exertionand leg swelling.  CW:[id number] negative.  Musculoskeletal: Positive forleg pain.  Skin: Positive forwound.  Neurological: Positive fordizzinessand numbness.  Hematologic: Positive forbruises/bleeds easily.  Psychiatric:Psychiatric negative.    Objective:  Objective     Vitals:   07/17/17 1522  BP: 99/64  Pulse: 84  Resp: 18  Temp: 97.6 F (36.4 C)  TempSrc: Oral  SpO2: 97%  Weight: 159 lb (72.1 kg)  Height: 6' (1.829 m)   Body mass index is 21.56 kg/m.  Physical Exam Constitutional: She appearswell-developed.  HENT:  Head:Normocephalic.  Eyes:Pupils are equal, round, and reactive to light.  Neck:Normal range of motion.  Cardiovascular:Normal rate.  Pulses: Radial pulses are 2+on the right side, and 2+on the left side.  Femoral pulses are 2+on the right side, and 2+on the left side. Palpable pulse in right bypass graft Monophasic signals in right dp/pt Abdominal:Soft. She exhibitsno mass.  Musculoskeletal:Normal range of motion. She exhibitsedema.  Neurological: She isalert.  Psychiatric: She has anormal mood and affect.  Herbehavior is normal.   Data: I independently interpreted her right lower extremity duplex which demonstrates an elevated velocity of 206 near the knee with a ratio 3.5. The ABI is 0.67 with a toe pressure of 47 monophasic signals on the right PT and DP. On the left side she has triphasic waveforms with noncompressible ABIs and toe pressure 76.  Assessment/Plan:   77 year old female status post right femoral to TP trunk bypass. She has ulceration on the lateral aspect of her right foot. Angiogram demonstrated patent bypass and no intervention was undertaken. Needs amputation of right 5th toe.   Vadie Principato C. [name], MD [facility] [place] Office: [phone] Pager: [phone]

## 2017-10-01 NOTE — Progress Notes (Signed)
[place] for Coumadin Indication: atrial fibrillation  No Known Allergies  Patient Measurements: Height: 6' (182.9 cm) Weight: 137 lb 9.1 oz (62.4 kg) IBW/kg (Calculated) : 73.1  Vital Signs: Temp: 97.6 F (36.4 C) (02/28 0532) Temp Source: Oral (02/28 0532) BP: 113/70 (02/28 0805) Pulse Rate: 104 (02/28 0532)  Labs: Recent Labs    09/29/17 0818 09/29/17 1616 10/01/17 0256  HGB 9.3* 8.5*  --   HCT 32.0* 29.1*  --   PLT 366 320  --   APTT 61*  --   --   LABPROT 22.0* 21.2* 19.5*  INR 1.94 1.85 1.66  CREATININE 1.06* 1.00  --     Estimated Creatinine Clearance: 48.6 mL/min (by C-G formula based on SCr of 1 mg/dL).   Medical History: Past Medical History:  Diagnosis Date  . Anxiety   . Arthritis   . Atrial fibrillation, chronic (HCC)    a. on Coumadin for anticoagulation.   . Cellulitis 06/29/2017   RIGHT LOWER LEG  . CHF (congestive heart failure) ([place])    combined  . COPD (chronic obstructive pulmonary disease) ([place])    on home O2 4 lpm  . Deaf   . 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 DVT (deep venous thrombosis) ([place])   . Essential hypertension   . GERD (gastroesophageal reflux disease)   . HOH (hard of hearing)   . Hyperlipidemia   . Hypothyroidism   . Oxygen dependent    4 lpm  . Peripheral neuropathy   . Peripheral vascular disease ([place])   . 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 Ulcer of ankle ([place])    Bilateral 2015  . Varicose veins    Assessment: CC/HPI:  Nonhealing wound right fifth toe presents for debridement and possible right fifth toe amputation.  PMH: history of right femoral to TP trunk bypass with vein 8/18, R medial leg wound, R 4th/5th toe gangrene, afib, anxiety, arthritis, R LE cellulitis, CHF, COPD, deaf, h/o DVT, HTN, GERD, HLD, hypothyroid, peripheral neuropathy, PVD, polycythemia vera, varicose veins.  Anticoag: Lovenox 40mg /d. In report RN says keeps bleeding through dressing. Restart  Coumadin 2/27. Hgb down to 8.5 post-procedure. Plts 320. INR 1.66 today. - Home dose 5mg  Mon, 2.5mg  all other days.  Goal of Therapy:  INR 2-3 Monitor platelets by anticoagulation protocol: Yes   Plan:  Coumadin 5mg  po x 1 tonight Daily INR  Victor Granados S. Alford Highland, PharmD, [facility] Clinical Staff Pharmacist Pager [phone]  Eilene Ghazi Stillinger 10/01/2017,10:15 AM

## 2017-10-01 NOTE — Progress Notes (Signed)
Pt d/ced home with [facility] services through [facility]. They are aware of d/c today. Butch Penny with [facility] provided wound vac to the room and connected to the patient.  Spouse to provide transportation home.

## 2017-10-01 NOTE — Discharge Summary (Signed)
Physician Discharge Summary   Patient ID: Charlotte [name] [id number] 77 y.o. [birth year]  Admit date: 09/29/2017  Discharge date and time: 2/28/192/28/90  Admitting Physician: Angelia Mould, MD   Discharge Physician: Dr. [name]  Admission Diagnoses: Peripheral vascular disease [facility]) [I73.9]  Discharge Diagnoses: PAD  Admission Condition: poor  Discharged Condition: fair  Indication for Admission: nonhealing ulceration right footNonhealing ulceration right foot  Hospital Course: Charlotte [name] is a 77y.o. Female who has nonhealing ulceration of lateral right foot despite revascularization.  She underwent Ray amputation of right fifth toe and placement of wound vac by Dr. Scot Dock on 09/29/17.  She tolerated this procedure well and was admitted to the hospital.  Case management was consulted to arrange home wound vac with home health dressing changes.  Cultures obtained during surgery came back positive for MRSA with sensitivity to Bactrim.  She will be prescribed 10 days of Bactrim.  She will also be prescribed 2 days of narcotic pain medication.  She will resume home dose of coumadin for atrial fibrillation.  She will follow up in office with Dr. [name] in about 2 weeks for wound check.  Discharge instructions were reviewed with the patient and her husband and they voice their understanding.  She will be discharged in stable condition.  Consults: None  Treatments: surgery: Ray amputation of right fifth toe and placement of VAC." The right fifth toe was removed back under popliteal block by Dr. Scot Dock under popliteal block by Dr. Doren Custard on 2/26 on 09/29/17  Discharge Exam: Vitals:   10/01/17 0532 10/01/17 0805  BP: 129/64 113/70  Pulse: (!) 104   Resp: 17   Temp: 97.6 F (36.4 C)   SpO2: 93%    See progress note 10/01/17  Disposition: 01-Home or Self Care  Patient Instructions:  Allergies as of 10/01/2017   No Known Allergies     Medication List    TAKE these medications    aspirin 81 MG EC tablet Take 1 tablet (81 mg total) by mouth daily.   diltiazem 240 MG 24 hr capsule Commonly known as:  CARDIZEM CD Take 240 mg by mouth daily.   furosemide 40 MG tablet Commonly known as:  LASIX Take 1 tablet (40 mg total) by mouth every other day.   furosemide 80 MG tablet Commonly known as:  LASIX Take 40 mg by mouth daily.   gabapentin 100 MG capsule Commonly known as:  NEURONTIN Take 100 mg by mouth 3 (three) times daily.   HYDROcodone-acetaminophen 5-325 MG tablet Commonly known as:  NORCO/VICODIN Take 1 tablet by mouth every 4 (four) hours as needed for moderate pain.   hydroxyurea 500 MG capsule Commonly known as:  HYDREA Take 2 capsules (1,000 mg total) by mouth daily. May take with food to minimize GI side effects.   ketorolac 0.5 % ophthalmic solution Commonly known as:  ACULAR Place 1 drop into both eyes See admin instructions. Begin 3 days prior to surgery. Place 1 drop in right eye twice daily until 09/28/2017. Place into in the left eye twice daily beginning on 09/11/2017   levothyroxine 75 MCG tablet Commonly known as:  SYNTHROID, LEVOTHROID Take 1 tablet (75 mcg total) by mouth daily before breakfast.   ofloxacin 0.3 % ophthalmic solution Commonly known as:  OCUFLOX Place 1 drop into the left eye See admin instructions. Begin 3 days prior to surgery. Place 1 drop in right eye 4 times daily. Place 1 drop in right eye morning of surgery and continue 1  week after surgery.   oxyCODONE-acetaminophen 5-325 MG tablet Commonly known as:  PERCOCET/ROXICET Take 1-2 tablets by mouth every 6 (six) hours as needed for moderate pain.   pravastatin 10 MG tablet Commonly known as:  PRAVACHOL Take 1 tablet (10 mg total) by mouth daily.   pravastatin 20 MG tablet Commonly known as:  PRAVACHOL Take 10 mg by mouth daily.   prednisoLONE acetate 1 % ophthalmic suspension Commonly known as:  PRED FORTE Place 1 drop into the right eye 3 (three) times  daily. Will begin in left eye following second procedure.   sulfamethoxazole-trimethoprim 400-80 MG tablet Commonly known as:  BACTRIM Take 1 tablet by mouth 2 (two) times daily for 10 days.   torsemide 20 MG tablet Commonly known as:  DEMADEX Take 1 tablet (20 mg total) by mouth daily as needed. May take for Weight gain of 3 lbs over 24 hours What changed:    reasons to take this  additional instructions   warfarin 5 MG tablet Commonly known as:  COUMADIN Take as directed. If you are unsure how to take this medication, talk to your nurse or doctor. Original instructions:  Take 2.5-5 mg by mouth daily. Take 1 tablet (5 mg) by mouth in the evening on Monday, take 0.5 tablet (2.5 mg) by mouth on all other days.   warfarin 2.5 MG tablet Commonly known as:  COUMADIN Take as directed. If you are unsure how to take this medication, talk to your nurse or doctor. Original instructions:  Take 1 tablet (2.5 mg total) by mouth daily at 6 PM. Take one tablet all days except take 2.5mg  on MWF            Durable Medical Equipment  (From admission, onward)        Start     Ordered   09/30/17 1215  For home use only DME Negative pressure wound device  Once    Question Answer Comment  Frequency of dressing change 3 times per week   Length of need 3 Months   Dressing type Foam   Amount of suction 125 mm/Hg   Pressure application Continuous pressure   Supplies 10 canisters and 15 dressings per month for duration of therapy      09/29/17 1555     Activity: activity as tolerated Diet: regular diet Wound Care: home health nurse arranged for 3x per week wound vac dressing changes  Follow-up with Dr. [name] in 2 weeks.  SignedDagoberto Ligas 10/01/2017 1:25 PM

## 2017-10-01 NOTE — Progress Notes (Signed)
  Progress Note    10/01/2017 7:57 AM 2 Days Post-Op  Subjective:  No overnight issues  Vitals:   09/30/17 1945 10/01/17 0532  BP: 111/64 129/64  Pulse: 78 (!) 104  Resp: 14 17  Temp: 97.8 F (36.6 C) 97.6 F (36.4 C)  SpO2: 98% 93%    Physical Exam: aaox3 Non labored respirations Right foot wound vac to suction  CBC    Component Value Date/Time   WBC 8.5 09/29/2017 1616   RBC 3.30 (L) 09/29/2017 1616   HGB 8.5 (L) 09/29/2017 1616   HCT 29.1 (L) 09/29/2017 1616   PLT 320 09/29/2017 1616   MCV 88.2 09/29/2017 1616   MCH 25.8 (L) 09/29/2017 1616   MCHC 29.2 (L) 09/29/2017 1616   RDW 23.0 (H) 09/29/2017 1616   LYMPHSABS 1.0 06/27/2017 1742   MONOABS 0.2 06/27/2017 1742   EOSABS 0.2 06/27/2017 1742   BASOSABS 0.2 (H) 06/27/2017 1742    BMET    Component Value Date/Time   NA 139 09/29/2017 1616   K 3.2 (L) 09/29/2017 1616   CL 108 09/29/2017 1616   CO2 22 09/29/2017 1616   GLUCOSE 96 09/29/2017 1616   BUN 16 09/29/2017 1616   CREATININE 1.00 09/29/2017 1616   CREATININE 1.21 (H) 08/14/2016 0733   CALCIUM 8.1 (L) 09/29/2017 1616   GFRNONAA 54 (L) 09/29/2017 1616   GFRAA >60 09/29/2017 1616    INR    Component Value Date/Time   INR 1.66 10/01/2017 0256     Intake/Output Summary (Last 24 hours) at 10/01/2017 0757 Last data filed at 10/01/2017 0533 Gross per 24 hour  Intake 600 ml  Output 1200 ml  Net -600 ml     Assessment:  77 y.o. female is s/p amputation of right 5th toe, wound vac in place  Plan: Needs home health for wound vac and d/c home and return on coumadin Will f/u in a couple weeks for wound check.   Katherina Wimer C. [name], MD [facility] [place] Office: [phone] Pager: [phone]  10/01/2017 7:57 AM

## 2017-10-02 ENCOUNTER — Telehealth: Payer: Self-pay | Admitting: *Deleted

## 2017-10-02 DIAGNOSIS — I110 Hypertensive heart disease with heart failure: Secondary | ICD-10-CM | POA: Diagnosis not present

## 2017-10-02 DIAGNOSIS — I2510 Atherosclerotic heart disease of native coronary artery without angina pectoris: Secondary | ICD-10-CM | POA: Diagnosis not present

## 2017-10-02 DIAGNOSIS — T8743 Infection of amputation stump, right lower extremity: Secondary | ICD-10-CM | POA: Diagnosis not present

## 2017-10-02 DIAGNOSIS — I5032 Chronic diastolic (congestive) heart failure: Secondary | ICD-10-CM | POA: Diagnosis not present

## 2017-10-02 DIAGNOSIS — B9562 Methicillin resistant Staphylococcus aureus infection as the cause of diseases classified elsewhere: Secondary | ICD-10-CM | POA: Diagnosis not present

## 2017-10-02 DIAGNOSIS — I739 Peripheral vascular disease, unspecified: Secondary | ICD-10-CM | POA: Diagnosis not present

## 2017-10-02 NOTE — Telephone Encounter (Signed)
Patient just came home  Needs instructions on when to start back on coumadin

## 2017-10-02 NOTE — Telephone Encounter (Signed)
Pt came home from hospital yesterday after amputation of rt 5th toe.  She was in [facility] 2/26 - 2/28.  D/C INR on 2/28 was 1.66.  She was sent home on Bactrim x 10 days.  Will finish 10/10/17.  [place] Nurse to have pt restart coumadin 2.5mg  daily except 5mg  on Mondays.  Recheck INR on 10/05/17.

## 2017-10-05 DIAGNOSIS — B9562 Methicillin resistant Staphylococcus aureus infection as the cause of diseases classified elsewhere: Secondary | ICD-10-CM | POA: Diagnosis not present

## 2017-10-05 DIAGNOSIS — I2510 Atherosclerotic heart disease of native coronary artery without angina pectoris: Secondary | ICD-10-CM | POA: Diagnosis not present

## 2017-10-05 DIAGNOSIS — T8743 Infection of amputation stump, right lower extremity: Secondary | ICD-10-CM | POA: Diagnosis not present

## 2017-10-05 DIAGNOSIS — I5032 Chronic diastolic (congestive) heart failure: Secondary | ICD-10-CM | POA: Diagnosis not present

## 2017-10-05 DIAGNOSIS — I110 Hypertensive heart disease with heart failure: Secondary | ICD-10-CM | POA: Diagnosis not present

## 2017-10-05 DIAGNOSIS — I739 Peripheral vascular disease, unspecified: Secondary | ICD-10-CM | POA: Diagnosis not present

## 2017-10-06 LAB — AEROBIC/ANAEROBIC CULTURE W GRAM STAIN (SURGICAL/DEEP WOUND)

## 2017-10-06 LAB — AEROBIC/ANAEROBIC CULTURE (SURGICAL/DEEP WOUND)

## 2017-10-07 ENCOUNTER — Ambulatory Visit (INDEPENDENT_AMBULATORY_CARE_PROVIDER_SITE_OTHER): Payer: Medicare Other | Admitting: *Deleted

## 2017-10-07 DIAGNOSIS — I110 Hypertensive heart disease with heart failure: Secondary | ICD-10-CM | POA: Diagnosis not present

## 2017-10-07 DIAGNOSIS — I482 Chronic atrial fibrillation, unspecified: Secondary | ICD-10-CM

## 2017-10-07 DIAGNOSIS — T8743 Infection of amputation stump, right lower extremity: Secondary | ICD-10-CM | POA: Diagnosis not present

## 2017-10-07 DIAGNOSIS — I5032 Chronic diastolic (congestive) heart failure: Secondary | ICD-10-CM | POA: Diagnosis not present

## 2017-10-07 DIAGNOSIS — I739 Peripheral vascular disease, unspecified: Secondary | ICD-10-CM | POA: Diagnosis not present

## 2017-10-07 DIAGNOSIS — Z5181 Encounter for therapeutic drug level monitoring: Secondary | ICD-10-CM | POA: Diagnosis not present

## 2017-10-07 DIAGNOSIS — I2510 Atherosclerotic heart disease of native coronary artery without angina pectoris: Secondary | ICD-10-CM | POA: Diagnosis not present

## 2017-10-07 DIAGNOSIS — B9562 Methicillin resistant Staphylococcus aureus infection as the cause of diseases classified elsewhere: Secondary | ICD-10-CM | POA: Diagnosis not present

## 2017-10-07 LAB — POCT INR: INR: 2.5

## 2017-10-07 NOTE — Patient Instructions (Signed)
Continue coumadin 2.5mg  daily except 5mg  on Mondays Recheck in 1 week Order given to [facility]

## 2017-10-09 ENCOUNTER — Other Ambulatory Visit: Payer: Self-pay

## 2017-10-09 ENCOUNTER — Encounter: Payer: Self-pay | Admitting: Vascular Surgery

## 2017-10-09 ENCOUNTER — Ambulatory Visit (INDEPENDENT_AMBULATORY_CARE_PROVIDER_SITE_OTHER): Payer: Medicare Other | Admitting: Vascular Surgery

## 2017-10-09 VITALS — BP 94/64 | HR 83 | Temp 97.1°F | Resp 20 | Ht 72.0 in | Wt 149.0 lb

## 2017-10-09 DIAGNOSIS — I739 Peripheral vascular disease, unspecified: Secondary | ICD-10-CM

## 2017-10-09 DIAGNOSIS — Z95828 Presence of other vascular implants and grafts: Secondary | ICD-10-CM

## 2017-10-09 MED ORDER — OXYCODONE-ACETAMINOPHEN 5-325 MG PO TABS: 1.0000 | ORAL_TABLET | ORAL | 0 refills | Status: DC | PRN

## 2017-10-09 NOTE — Progress Notes (Signed)
    Postoperative Visit    History of Present Illness   Charlotte [name] is a 77 y.o. female who presents for postoperative follow-up for: right 5th toe ray amputation.(Date: 09/29/17).  Patient is s/p femoral to TP trunk bypass with vein by Dr. [name] 03/2017.  She underwent aortogram with run off 09/21/17 due to non healing wound of R foot by Dr. [name] which demonstrated a patent bypass with dominant runoff through peroneal artery.  She has had wound vac changes by [facility] nurse since discharge from hospital.  She has also been taking Bactrim due to MRSA positive culture taken during 5th toe amputation.  She denies rest pain.  Also denies fevers, chills, N/V.  She is on aspirin and statin regimen.  She is also on coumadin for atrial fibrillation.   For VQI Use Only   PRE-ADM LIVING: Home  AMB STATUS: Ambulatory with Assistance   Physical Examination   Vitals:   10/09/17 1421  BP: 94/64  Pulse: 83  Resp: 20  Temp: (!) 97.1 F (36.2 C)  SpO2: 98%    RLE: 5th toe amp incision with odor, healthy appearing wound bed with some granulation tissue.  R peroneal, AT, and PT by doppler   Medical Decision Making   Charlotte [name] is a 77 y.o. female who presents s/p right 5th toe ray amputation  The patient's amputation site is healing appropriately at this time Bypass remains patent with strong pedal doppler signals  Continue aspirin and statin  Wound vac was re-applied and will be continued with [place] wound in about 3 weeks  Dagoberto Ligas, PA-C [facility] [place] Office: [phone]

## 2017-10-12 DIAGNOSIS — I739 Peripheral vascular disease, unspecified: Secondary | ICD-10-CM | POA: Diagnosis not present

## 2017-10-12 DIAGNOSIS — B9562 Methicillin resistant Staphylococcus aureus infection as the cause of diseases classified elsewhere: Secondary | ICD-10-CM | POA: Diagnosis not present

## 2017-10-12 DIAGNOSIS — I2510 Atherosclerotic heart disease of native coronary artery without angina pectoris: Secondary | ICD-10-CM | POA: Diagnosis not present

## 2017-10-12 DIAGNOSIS — I5032 Chronic diastolic (congestive) heart failure: Secondary | ICD-10-CM | POA: Diagnosis not present

## 2017-10-12 DIAGNOSIS — T8743 Infection of amputation stump, right lower extremity: Secondary | ICD-10-CM | POA: Diagnosis not present

## 2017-10-12 DIAGNOSIS — I110 Hypertensive heart disease with heart failure: Secondary | ICD-10-CM | POA: Diagnosis not present

## 2017-10-13 DIAGNOSIS — I5032 Chronic diastolic (congestive) heart failure: Secondary | ICD-10-CM | POA: Diagnosis not present

## 2017-10-13 DIAGNOSIS — B9562 Methicillin resistant Staphylococcus aureus infection as the cause of diseases classified elsewhere: Secondary | ICD-10-CM | POA: Diagnosis not present

## 2017-10-13 DIAGNOSIS — I739 Peripheral vascular disease, unspecified: Secondary | ICD-10-CM | POA: Diagnosis not present

## 2017-10-13 DIAGNOSIS — I2510 Atherosclerotic heart disease of native coronary artery without angina pectoris: Secondary | ICD-10-CM | POA: Diagnosis not present

## 2017-10-13 DIAGNOSIS — I110 Hypertensive heart disease with heart failure: Secondary | ICD-10-CM | POA: Diagnosis not present

## 2017-10-13 DIAGNOSIS — T8743 Infection of amputation stump, right lower extremity: Secondary | ICD-10-CM | POA: Diagnosis not present

## 2017-10-14 ENCOUNTER — Other Ambulatory Visit: Payer: Self-pay | Admitting: Cardiovascular Disease

## 2017-10-14 ENCOUNTER — Ambulatory Visit (INDEPENDENT_AMBULATORY_CARE_PROVIDER_SITE_OTHER): Payer: Medicare Other | Admitting: *Deleted

## 2017-10-14 DIAGNOSIS — Z5181 Encounter for therapeutic drug level monitoring: Secondary | ICD-10-CM

## 2017-10-14 DIAGNOSIS — I482 Chronic atrial fibrillation, unspecified: Secondary | ICD-10-CM

## 2017-10-14 DIAGNOSIS — I2510 Atherosclerotic heart disease of native coronary artery without angina pectoris: Secondary | ICD-10-CM | POA: Diagnosis not present

## 2017-10-14 DIAGNOSIS — B9562 Methicillin resistant Staphylococcus aureus infection as the cause of diseases classified elsewhere: Secondary | ICD-10-CM | POA: Diagnosis not present

## 2017-10-14 DIAGNOSIS — I739 Peripheral vascular disease, unspecified: Secondary | ICD-10-CM | POA: Diagnosis not present

## 2017-10-14 DIAGNOSIS — T8743 Infection of amputation stump, right lower extremity: Secondary | ICD-10-CM | POA: Diagnosis not present

## 2017-10-14 DIAGNOSIS — I5032 Chronic diastolic (congestive) heart failure: Secondary | ICD-10-CM | POA: Diagnosis not present

## 2017-10-14 DIAGNOSIS — I110 Hypertensive heart disease with heart failure: Secondary | ICD-10-CM | POA: Diagnosis not present

## 2017-10-14 LAB — POCT INR: INR: 1.4

## 2017-10-14 NOTE — Patient Instructions (Signed)
Take 5mg  tonight and tomorrow night then resume 2.5mg  daily except 5mg  on Mondays Recheck in 1 week Order given to [facility]

## 2017-10-16 DIAGNOSIS — B9562 Methicillin resistant Staphylococcus aureus infection as the cause of diseases classified elsewhere: Secondary | ICD-10-CM | POA: Diagnosis not present

## 2017-10-16 DIAGNOSIS — I5032 Chronic diastolic (congestive) heart failure: Secondary | ICD-10-CM | POA: Diagnosis not present

## 2017-10-16 DIAGNOSIS — I2510 Atherosclerotic heart disease of native coronary artery without angina pectoris: Secondary | ICD-10-CM | POA: Diagnosis not present

## 2017-10-16 DIAGNOSIS — I739 Peripheral vascular disease, unspecified: Secondary | ICD-10-CM | POA: Diagnosis not present

## 2017-10-16 DIAGNOSIS — I110 Hypertensive heart disease with heart failure: Secondary | ICD-10-CM | POA: Diagnosis not present

## 2017-10-16 DIAGNOSIS — T8743 Infection of amputation stump, right lower extremity: Secondary | ICD-10-CM | POA: Diagnosis not present

## 2017-10-19 DIAGNOSIS — I110 Hypertensive heart disease with heart failure: Secondary | ICD-10-CM | POA: Diagnosis not present

## 2017-10-19 DIAGNOSIS — I739 Peripheral vascular disease, unspecified: Secondary | ICD-10-CM | POA: Diagnosis not present

## 2017-10-19 DIAGNOSIS — B9562 Methicillin resistant Staphylococcus aureus infection as the cause of diseases classified elsewhere: Secondary | ICD-10-CM | POA: Diagnosis not present

## 2017-10-19 DIAGNOSIS — I2510 Atherosclerotic heart disease of native coronary artery without angina pectoris: Secondary | ICD-10-CM | POA: Diagnosis not present

## 2017-10-19 DIAGNOSIS — T8743 Infection of amputation stump, right lower extremity: Secondary | ICD-10-CM | POA: Diagnosis not present

## 2017-10-19 DIAGNOSIS — I5032 Chronic diastolic (congestive) heart failure: Secondary | ICD-10-CM | POA: Diagnosis not present

## 2017-10-21 ENCOUNTER — Ambulatory Visit (INDEPENDENT_AMBULATORY_CARE_PROVIDER_SITE_OTHER): Payer: Medicare Other | Admitting: *Deleted

## 2017-10-21 DIAGNOSIS — I5032 Chronic diastolic (congestive) heart failure: Secondary | ICD-10-CM | POA: Diagnosis not present

## 2017-10-21 DIAGNOSIS — I110 Hypertensive heart disease with heart failure: Secondary | ICD-10-CM | POA: Diagnosis not present

## 2017-10-21 DIAGNOSIS — T8743 Infection of amputation stump, right lower extremity: Secondary | ICD-10-CM | POA: Diagnosis not present

## 2017-10-21 DIAGNOSIS — I482 Chronic atrial fibrillation, unspecified: Secondary | ICD-10-CM

## 2017-10-21 DIAGNOSIS — Z5181 Encounter for therapeutic drug level monitoring: Secondary | ICD-10-CM

## 2017-10-21 DIAGNOSIS — I2510 Atherosclerotic heart disease of native coronary artery without angina pectoris: Secondary | ICD-10-CM | POA: Diagnosis not present

## 2017-10-21 DIAGNOSIS — I739 Peripheral vascular disease, unspecified: Secondary | ICD-10-CM | POA: Diagnosis not present

## 2017-10-21 DIAGNOSIS — B9562 Methicillin resistant Staphylococcus aureus infection as the cause of diseases classified elsewhere: Secondary | ICD-10-CM | POA: Diagnosis not present

## 2017-10-21 LAB — POCT INR: INR: 2.6

## 2017-10-21 NOTE — Patient Instructions (Signed)
Continue coumadin 2.5mg  daily except 5mg  on Mondays Recheck in 1 week Order given to [facility]

## 2017-10-22 DIAGNOSIS — I110 Hypertensive heart disease with heart failure: Secondary | ICD-10-CM | POA: Diagnosis not present

## 2017-10-22 DIAGNOSIS — I739 Peripheral vascular disease, unspecified: Secondary | ICD-10-CM | POA: Diagnosis not present

## 2017-10-22 DIAGNOSIS — T8743 Infection of amputation stump, right lower extremity: Secondary | ICD-10-CM | POA: Diagnosis not present

## 2017-10-22 DIAGNOSIS — B9562 Methicillin resistant Staphylococcus aureus infection as the cause of diseases classified elsewhere: Secondary | ICD-10-CM | POA: Diagnosis not present

## 2017-10-22 DIAGNOSIS — I5032 Chronic diastolic (congestive) heart failure: Secondary | ICD-10-CM | POA: Diagnosis not present

## 2017-10-22 DIAGNOSIS — I2510 Atherosclerotic heart disease of native coronary artery without angina pectoris: Secondary | ICD-10-CM | POA: Diagnosis not present

## 2017-10-23 DIAGNOSIS — I739 Peripheral vascular disease, unspecified: Secondary | ICD-10-CM | POA: Diagnosis not present

## 2017-10-23 DIAGNOSIS — B9562 Methicillin resistant Staphylococcus aureus infection as the cause of diseases classified elsewhere: Secondary | ICD-10-CM | POA: Diagnosis not present

## 2017-10-23 DIAGNOSIS — I2510 Atherosclerotic heart disease of native coronary artery without angina pectoris: Secondary | ICD-10-CM | POA: Diagnosis not present

## 2017-10-23 DIAGNOSIS — T8743 Infection of amputation stump, right lower extremity: Secondary | ICD-10-CM | POA: Diagnosis not present

## 2017-10-23 DIAGNOSIS — I110 Hypertensive heart disease with heart failure: Secondary | ICD-10-CM | POA: Diagnosis not present

## 2017-10-23 DIAGNOSIS — I5032 Chronic diastolic (congestive) heart failure: Secondary | ICD-10-CM | POA: Diagnosis not present

## 2017-10-26 DIAGNOSIS — I110 Hypertensive heart disease with heart failure: Secondary | ICD-10-CM | POA: Diagnosis not present

## 2017-10-26 DIAGNOSIS — I5032 Chronic diastolic (congestive) heart failure: Secondary | ICD-10-CM | POA: Diagnosis not present

## 2017-10-26 DIAGNOSIS — B9562 Methicillin resistant Staphylococcus aureus infection as the cause of diseases classified elsewhere: Secondary | ICD-10-CM | POA: Diagnosis not present

## 2017-10-26 DIAGNOSIS — I739 Peripheral vascular disease, unspecified: Secondary | ICD-10-CM | POA: Diagnosis not present

## 2017-10-26 DIAGNOSIS — I2510 Atherosclerotic heart disease of native coronary artery without angina pectoris: Secondary | ICD-10-CM | POA: Diagnosis not present

## 2017-10-26 DIAGNOSIS — T8743 Infection of amputation stump, right lower extremity: Secondary | ICD-10-CM | POA: Diagnosis not present

## 2017-10-27 ENCOUNTER — Ambulatory Visit (INDEPENDENT_AMBULATORY_CARE_PROVIDER_SITE_OTHER): Payer: Medicare Other | Admitting: *Deleted

## 2017-10-27 DIAGNOSIS — I482 Chronic atrial fibrillation, unspecified: Secondary | ICD-10-CM

## 2017-10-27 DIAGNOSIS — Z5181 Encounter for therapeutic drug level monitoring: Secondary | ICD-10-CM

## 2017-10-27 LAB — POCT INR: INR: 2.6

## 2017-10-27 NOTE — Patient Instructions (Signed)
Continue coumadin 2.5mg  daily except 5mg  on Mondays Recheck in 2 week Order given to [facility]

## 2017-10-28 DIAGNOSIS — T8743 Infection of amputation stump, right lower extremity: Secondary | ICD-10-CM | POA: Diagnosis not present

## 2017-10-28 DIAGNOSIS — B9562 Methicillin resistant Staphylococcus aureus infection as the cause of diseases classified elsewhere: Secondary | ICD-10-CM | POA: Diagnosis not present

## 2017-10-28 DIAGNOSIS — I2510 Atherosclerotic heart disease of native coronary artery without angina pectoris: Secondary | ICD-10-CM | POA: Diagnosis not present

## 2017-10-28 DIAGNOSIS — I739 Peripheral vascular disease, unspecified: Secondary | ICD-10-CM | POA: Diagnosis not present

## 2017-10-28 DIAGNOSIS — I110 Hypertensive heart disease with heart failure: Secondary | ICD-10-CM | POA: Diagnosis not present

## 2017-10-28 DIAGNOSIS — I5032 Chronic diastolic (congestive) heart failure: Secondary | ICD-10-CM | POA: Diagnosis not present

## 2017-10-29 DIAGNOSIS — D45 Polycythemia vera: Secondary | ICD-10-CM | POA: Diagnosis not present

## 2017-10-29 DIAGNOSIS — E039 Hypothyroidism, unspecified: Secondary | ICD-10-CM | POA: Diagnosis not present

## 2017-10-29 DIAGNOSIS — E063 Autoimmune thyroiditis: Secondary | ICD-10-CM | POA: Diagnosis not present

## 2017-10-29 DIAGNOSIS — I5030 Unspecified diastolic (congestive) heart failure: Secondary | ICD-10-CM | POA: Diagnosis not present

## 2017-10-29 DIAGNOSIS — E782 Mixed hyperlipidemia: Secondary | ICD-10-CM | POA: Diagnosis not present

## 2017-10-29 DIAGNOSIS — I4891 Unspecified atrial fibrillation: Secondary | ICD-10-CM | POA: Diagnosis not present

## 2017-10-29 DIAGNOSIS — Z1389 Encounter for screening for other disorder: Secondary | ICD-10-CM | POA: Diagnosis not present

## 2017-10-29 DIAGNOSIS — I10 Essential (primary) hypertension: Secondary | ICD-10-CM | POA: Diagnosis not present

## 2017-10-29 DIAGNOSIS — Z6820 Body mass index (BMI) 20.0-20.9, adult: Secondary | ICD-10-CM | POA: Diagnosis not present

## 2017-10-29 DIAGNOSIS — G894 Chronic pain syndrome: Secondary | ICD-10-CM | POA: Diagnosis not present

## 2017-10-30 DIAGNOSIS — T8743 Infection of amputation stump, right lower extremity: Secondary | ICD-10-CM | POA: Diagnosis not present

## 2017-10-30 DIAGNOSIS — B9562 Methicillin resistant Staphylococcus aureus infection as the cause of diseases classified elsewhere: Secondary | ICD-10-CM | POA: Diagnosis not present

## 2017-10-30 DIAGNOSIS — I5032 Chronic diastolic (congestive) heart failure: Secondary | ICD-10-CM | POA: Diagnosis not present

## 2017-10-30 DIAGNOSIS — I739 Peripheral vascular disease, unspecified: Secondary | ICD-10-CM | POA: Diagnosis not present

## 2017-10-30 DIAGNOSIS — I110 Hypertensive heart disease with heart failure: Secondary | ICD-10-CM | POA: Diagnosis not present

## 2017-10-30 DIAGNOSIS — I2510 Atherosclerotic heart disease of native coronary artery without angina pectoris: Secondary | ICD-10-CM | POA: Diagnosis not present

## 2017-11-02 DIAGNOSIS — I5032 Chronic diastolic (congestive) heart failure: Secondary | ICD-10-CM | POA: Diagnosis not present

## 2017-11-02 DIAGNOSIS — T8743 Infection of amputation stump, right lower extremity: Secondary | ICD-10-CM | POA: Diagnosis not present

## 2017-11-02 DIAGNOSIS — B9562 Methicillin resistant Staphylococcus aureus infection as the cause of diseases classified elsewhere: Secondary | ICD-10-CM | POA: Diagnosis not present

## 2017-11-02 DIAGNOSIS — I110 Hypertensive heart disease with heart failure: Secondary | ICD-10-CM | POA: Diagnosis not present

## 2017-11-02 DIAGNOSIS — I2510 Atherosclerotic heart disease of native coronary artery without angina pectoris: Secondary | ICD-10-CM | POA: Diagnosis not present

## 2017-11-02 DIAGNOSIS — I739 Peripheral vascular disease, unspecified: Secondary | ICD-10-CM | POA: Diagnosis not present

## 2017-11-04 DIAGNOSIS — B9562 Methicillin resistant Staphylococcus aureus infection as the cause of diseases classified elsewhere: Secondary | ICD-10-CM | POA: Diagnosis not present

## 2017-11-04 DIAGNOSIS — I2510 Atherosclerotic heart disease of native coronary artery without angina pectoris: Secondary | ICD-10-CM | POA: Diagnosis not present

## 2017-11-04 DIAGNOSIS — T8743 Infection of amputation stump, right lower extremity: Secondary | ICD-10-CM | POA: Diagnosis not present

## 2017-11-04 DIAGNOSIS — I5032 Chronic diastolic (congestive) heart failure: Secondary | ICD-10-CM | POA: Diagnosis not present

## 2017-11-04 DIAGNOSIS — I110 Hypertensive heart disease with heart failure: Secondary | ICD-10-CM | POA: Diagnosis not present

## 2017-11-04 DIAGNOSIS — I739 Peripheral vascular disease, unspecified: Secondary | ICD-10-CM | POA: Diagnosis not present

## 2017-11-06 ENCOUNTER — Ambulatory Visit (INDEPENDENT_AMBULATORY_CARE_PROVIDER_SITE_OTHER): Payer: Self-pay | Admitting: Vascular Surgery

## 2017-11-06 ENCOUNTER — Encounter: Payer: Self-pay | Admitting: Vascular Surgery

## 2017-11-06 VITALS — BP 113/76 | HR 106 | Temp 96.5°F | Resp 18 | Ht 72.0 in | Wt 149.0 lb

## 2017-11-06 DIAGNOSIS — I739 Peripheral vascular disease, unspecified: Secondary | ICD-10-CM

## 2017-11-06 NOTE — Progress Notes (Signed)
Subjective:     Patient ID: Charlotte [name], female   DOB: July 15, 1941, 77 y.o.   MRN: [id number]  HPI 77 year old female status post right femoral peroneal bypass.  She underwent angiogram which demonstrated patency she had progressive necrosis of her right fifth toe and that is since been amputated.  She now has wound care with a home wound VAC.  She is doing very well with just minimal pain with wound VAC changes.   Review of Systems Right foot pain    Objective:   Physical Exam Awake alert and oriented Right lower extremity without edema and incisions have healed Strong right peroneal signal Right fifth toe amputation site with good granulation bleeding is 4 x 2 cm in size    Assessmentplan     77 year old female follows up after right fifth toe amputation with good granulation.  Wound VAC was replaced today.  She will follow-up in 1 month for wound care check we can see her sooner if needed.  Brandon C. [name], MD [facility] [place] Office: [phone] Pager: [phone]

## 2017-11-09 ENCOUNTER — Telehealth: Payer: Self-pay | Admitting: *Deleted

## 2017-11-09 ENCOUNTER — Ambulatory Visit (INDEPENDENT_AMBULATORY_CARE_PROVIDER_SITE_OTHER): Payer: Medicare Other | Admitting: *Deleted

## 2017-11-09 DIAGNOSIS — B9562 Methicillin resistant Staphylococcus aureus infection as the cause of diseases classified elsewhere: Secondary | ICD-10-CM | POA: Diagnosis not present

## 2017-11-09 DIAGNOSIS — I482 Chronic atrial fibrillation, unspecified: Secondary | ICD-10-CM

## 2017-11-09 DIAGNOSIS — I5032 Chronic diastolic (congestive) heart failure: Secondary | ICD-10-CM | POA: Diagnosis not present

## 2017-11-09 DIAGNOSIS — I2510 Atherosclerotic heart disease of native coronary artery without angina pectoris: Secondary | ICD-10-CM | POA: Diagnosis not present

## 2017-11-09 DIAGNOSIS — Z5181 Encounter for therapeutic drug level monitoring: Secondary | ICD-10-CM

## 2017-11-09 DIAGNOSIS — I110 Hypertensive heart disease with heart failure: Secondary | ICD-10-CM | POA: Diagnosis not present

## 2017-11-09 DIAGNOSIS — T8743 Infection of amputation stump, right lower extremity: Secondary | ICD-10-CM | POA: Diagnosis not present

## 2017-11-09 DIAGNOSIS — I739 Peripheral vascular disease, unspecified: Secondary | ICD-10-CM | POA: Diagnosis not present

## 2017-11-09 LAB — POCT INR: INR: 2.3

## 2017-11-09 NOTE — Telephone Encounter (Signed)
Done.  See coumadin note. 

## 2017-11-09 NOTE — Telephone Encounter (Signed)
INR - 2.3 / please call with instructions / tg  °

## 2017-11-09 NOTE — Patient Instructions (Signed)
Continue coumadin 2.5mg  daily except 5mg  on Mondays Recheck in 2 week Order given to [employer]

## 2017-11-10 ENCOUNTER — Other Ambulatory Visit (HOSPITAL_COMMUNITY): Payer: Self-pay | Admitting: *Deleted

## 2017-11-10 DIAGNOSIS — D45 Polycythemia vera: Secondary | ICD-10-CM

## 2017-11-11 ENCOUNTER — Inpatient Hospital Stay (HOSPITAL_BASED_OUTPATIENT_CLINIC_OR_DEPARTMENT_OTHER): Payer: Medicare Other | Admitting: Internal Medicine

## 2017-11-11 ENCOUNTER — Inpatient Hospital Stay (HOSPITAL_COMMUNITY): Payer: Medicare Other | Attending: Internal Medicine

## 2017-11-11 ENCOUNTER — Encounter (HOSPITAL_COMMUNITY): Payer: Self-pay | Admitting: Internal Medicine

## 2017-11-11 VITALS — BP 92/61 | HR 101 | Temp 97.6°F | Resp 20 | Wt 150.0 lb

## 2017-11-11 DIAGNOSIS — K219 Gastro-esophageal reflux disease without esophagitis: Secondary | ICD-10-CM | POA: Insufficient documentation

## 2017-11-11 DIAGNOSIS — E785 Hyperlipidemia, unspecified: Secondary | ICD-10-CM

## 2017-11-11 DIAGNOSIS — Z9981 Dependence on supplemental oxygen: Secondary | ICD-10-CM | POA: Insufficient documentation

## 2017-11-11 DIAGNOSIS — Z89421 Acquired absence of other right toe(s): Secondary | ICD-10-CM | POA: Insufficient documentation

## 2017-11-11 DIAGNOSIS — Z7901 Long term (current) use of anticoagulants: Secondary | ICD-10-CM | POA: Insufficient documentation

## 2017-11-11 DIAGNOSIS — I482 Chronic atrial fibrillation: Secondary | ICD-10-CM | POA: Insufficient documentation

## 2017-11-11 DIAGNOSIS — Z9889 Other specified postprocedural states: Secondary | ICD-10-CM | POA: Diagnosis not present

## 2017-11-11 DIAGNOSIS — Z7982 Long term (current) use of aspirin: Secondary | ICD-10-CM

## 2017-11-11 DIAGNOSIS — I5033 Acute on chronic diastolic (congestive) heart failure: Secondary | ICD-10-CM

## 2017-11-11 DIAGNOSIS — E039 Hypothyroidism, unspecified: Secondary | ICD-10-CM | POA: Insufficient documentation

## 2017-11-11 DIAGNOSIS — D649 Anemia, unspecified: Secondary | ICD-10-CM

## 2017-11-11 DIAGNOSIS — J449 Chronic obstructive pulmonary disease, unspecified: Secondary | ICD-10-CM | POA: Insufficient documentation

## 2017-11-11 DIAGNOSIS — T8743 Infection of amputation stump, right lower extremity: Secondary | ICD-10-CM | POA: Diagnosis not present

## 2017-11-11 DIAGNOSIS — Z79899 Other long term (current) drug therapy: Secondary | ICD-10-CM | POA: Insufficient documentation

## 2017-11-11 DIAGNOSIS — N183 Chronic kidney disease, stage 3 (moderate): Secondary | ICD-10-CM | POA: Insufficient documentation

## 2017-11-11 DIAGNOSIS — D45 Polycythemia vera: Secondary | ICD-10-CM

## 2017-11-11 DIAGNOSIS — I110 Hypertensive heart disease with heart failure: Secondary | ICD-10-CM | POA: Diagnosis not present

## 2017-11-11 DIAGNOSIS — I739 Peripheral vascular disease, unspecified: Secondary | ICD-10-CM | POA: Diagnosis not present

## 2017-11-11 DIAGNOSIS — I130 Hypertensive heart and chronic kidney disease with heart failure and stage 1 through stage 4 chronic kidney disease, or unspecified chronic kidney disease: Secondary | ICD-10-CM

## 2017-11-11 DIAGNOSIS — I2510 Atherosclerotic heart disease of native coronary artery without angina pectoris: Secondary | ICD-10-CM | POA: Diagnosis not present

## 2017-11-11 DIAGNOSIS — B9562 Methicillin resistant Staphylococcus aureus infection as the cause of diseases classified elsewhere: Secondary | ICD-10-CM | POA: Diagnosis not present

## 2017-11-11 DIAGNOSIS — I5032 Chronic diastolic (congestive) heart failure: Secondary | ICD-10-CM | POA: Diagnosis not present

## 2017-11-11 LAB — CBC WITH DIFFERENTIAL/PLATELET
BASOS ABS: 0.1 10*3/uL (ref 0.0–0.1)
Basophils Relative: 1 %
Eosinophils Absolute: 0.1 10*3/uL (ref 0.0–0.7)
Eosinophils Relative: 1 %
HCT: 29.4 % — ABNORMAL LOW (ref 36.0–46.0)
Hemoglobin: 8.3 g/dL — ABNORMAL LOW (ref 12.0–15.0)
LYMPHS PCT: 10 %
Lymphs Abs: 0.7 10*3/uL (ref 0.7–4.0)
MCH: 25.9 pg — ABNORMAL LOW (ref 26.0–34.0)
MCHC: 28.2 g/dL — ABNORMAL LOW (ref 30.0–36.0)
MCV: 91.6 fL (ref 78.0–100.0)
Monocytes Absolute: 0.2 10*3/uL (ref 0.1–1.0)
Monocytes Relative: 3 %
NEUTROS PCT: 85 %
Neutro Abs: 6.4 10*3/uL (ref 1.7–7.7)
PLATELETS: 156 10*3/uL (ref 150–400)
RBC: 3.21 MIL/uL — AB (ref 3.87–5.11)
RDW: 21.8 % — AB (ref 11.5–15.5)
WBC: 7.5 10*3/uL (ref 4.0–10.5)

## 2017-11-11 LAB — COMPREHENSIVE METABOLIC PANEL
ALBUMIN: 2.9 g/dL — AB (ref 3.5–5.0)
AST: 14 U/L — AB (ref 15–41)
Alkaline Phosphatase: 83 U/L (ref 38–126)
Anion gap: 9 (ref 5–15)
BUN: 17 mg/dL (ref 6–20)
CO2: 23 mmol/L (ref 22–32)
CREATININE: 1 mg/dL (ref 0.44–1.00)
Calcium: 8.7 mg/dL — ABNORMAL LOW (ref 8.9–10.3)
Chloride: 106 mmol/L (ref 101–111)
GFR calc Af Amer: >60 mL/min (ref >60)
GFR, EST NON AFRICAN AMERICAN: 54 mL/min — AB (ref >60)
GLUCOSE: 104 mg/dL — AB (ref 65–99)
POTASSIUM: 4 mmol/L (ref 3.5–5.1)
Sodium: 138 mmol/L (ref 135–145)
Total Bilirubin: 0.9 mg/dL (ref 0.3–1.2)
Total Protein: 6.9 g/dL (ref 6.5–8.1)

## 2017-11-11 NOTE — Patient Instructions (Signed)
[place] at [facility] Discharge Instructions  Seen by Dr. Walden Field today       Repeat labs in 1 month    Follow-up in 4 months  Thank you for choosing [place] at [facility] to provide your oncology and hematology care.  To afford each patient quality time with our provider, please arrive at least 15 minutes before your scheduled appointment time.   If you have a lab appointment with the [facility] please come in thru the  Main Entrance and check in at the main information desk  You need to re-schedule your appointment should you arrive 10 or more minutes late.  We strive to give you quality time with our providers, and arriving late affects you and other patients whose appointments are after yours.  Also, if you no show three or more times for appointments you may be dismissed from the clinic at the providers discretion.     Again, thank you for choosing [facility].  Our hope is that these requests will decrease the amount of time that you wait before being seen by our physicians.       _____________________________________________________________  Should you have questions after your visit to [facility], please contact our office at [phone] between the hours of 8:30 a.m. and 4:30 p.m.  Voicemails left after 4:30 p.m. will not be returned until the following business day.  For prescription refill requests, have your pharmacy contact our office.       Resources For Cancer Patients and their Caregivers ? American Cancer Society: Can assist with transportation, wigs, general needs, runs Look Good Feel Better.        425-544-5113 ? Cancer Care: Provides financial assistance, online support groups, medication/co-pay assistance.  1-800-813-HOPE 787-118-7117) ? [place] Assists [place] cancer patients and their families through emotional , educational and financial support.   (713)250-4029 ? [place] DSS Where to apply for food stamps, Medicaid and utility assistance. 503-300-5069 ? [facility]: Transportation to medical appointments. (332)041-6169 ? Social Security Administration: May apply for disability if have a Stage IV cancer. 908-599-2354 917-744-8960 ? [employer], Disability and Transit Services: Assists with nutrition, care and transit needs. [place] Support Programs:   > Cancer Support Group  2nd Tuesday of the month 1pm-2pm, Journey Room   > Creative Journey  3rd Tuesday of the month 1130am-1pm, Journey Room

## 2017-11-11 NOTE — Progress Notes (Signed)
Diagnosis Polycythemia vera ([place]) - Plan: CBC with Differential/Platelet, Comprehensive metabolic panel, Lactate dehydrogenase, Ferritin, CBC with Differential/Platelet, Comprehensive metabolic panel, Lactate dehydrogenase, Ferritin  Staging Cancer Staging No matching staging information was found for the patient.  Assessment and Plan:   1.  P Vera.  77 y.o. Female followed by Dr. Talbert Cage. 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  Labs done 11/11/2017 show a white count 7.5 hemoglobin 8.3 platelets 156,000 creatinine is 1.  Hemoglobin has continued to trend downward and I have asked for her to take Hydrea 500 mg once a day.  She will have repeat labs in 1 month to assess for any improvement in her blood count.  White count and platelets are within normal limits.  She will be seen for follow-up in August 2019.    2.  Anemia.  Hemoglobin is decreased at 8.3.  She has previously been transfused in November 2018.  Will decrease Hydrea to 500 mg once a day and repeat labs in 1 month for follow-up evaluation.  She denies any blood in her stool or urine.  3.  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  She is followed by surgery.  4.  Atrial fibrillation.  Patient is on Coumadin.  Follow-up with PCP or cardiology for monitoring.  5.  Hypertension.  Blood pressure is 92/61.  Continue to follow-up with PCP.  Interval history.  Patient was previously followed by Dr. Talbert Cage  for Jak 2+ myeloproliferative disorder.  Patient was previously seen by vascular surgery and was diagnosed with dry gangrene and they say that her bypass graft in her right lower extremity was patent.  They recommended outpatient follow-up for her dry gangrene wounds.  She had a right lower extremity venous ultrasound performed which was negative for DVT on 06/10/2017.  Her hemoglobin dropped down to 7.2 g/dL while inpatient, and she was given 2 units PRBC transfusion in  06/2017.  She continues to take her hydrea 1000 mg PO daily.   Current Status: Patient is seen today for follow-up.  She is here to go over labs.  She had a recent right toe amputation and has a wound VAC in place.   Problem List Patient Active Problem List   Diagnosis Date Noted  . CKD (chronic kidney disease) stage 3, GFR 30-59 ml/min (HCC) [N18.3]   . Foot pain, right [M79.671]   . Cellulitis and abscess of right leg [L03.115, L02.415] 06/27/2017  . 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 Hypokalemia [E87.6] 06/27/2017  . Osteomyelitis of right foot ([place]) [M86.9] 06/27/2017  . S/P femoral-popliteal bypass surgery [Z95.828]   . Edema of right lower extremity [R60.0] 06/08/2017  . GERD (gastroesophageal reflux disease) [K21.9] 06/08/2017  . Pressure ulcer [L89.90] 05/29/2017  . Sepsis ([place]) [A41.9] 05/28/2017  . Hypotension [I95.9] 05/28/2017  . Pressure injury of skin [L89.90] 03/14/2017  . Acute renal failure (ARF) ([place]) [N17.9]   . Hyperlipidemia [E78.5] 03/05/2017  . Lower extremity arterial insufficiency, severe, right ([place]) [I73.9] 03/04/2017  . Chronic respiratory failure with hypoxia ([place]) [J96.11] 02/12/2017  . Cellulitis [L03.90] 07/03/2016  . Anemia [D64.9] 07/03/2016  . Cough [R05] 11/29/2015  . Hypoxemia [R09.02] 11/29/2015  . CHF (congestive heart failure) ([place]) [I50.9] 11/29/2015  . Acute on chronic respiratory failure with hypoxia (HCC) [J96.21]   . Pleural effusion on left [J90]   . Acute on chronic diastolic (congestive) heart failure ([place]) [I50.33]   . Dyspnea [R06.00] 11/22/2014  .  Pleural effusion [J90] 11/22/2014  . Hard of hearing [H91.90] 11/22/2014  . Thrombocytosis ([place]) [D47.3] 11/22/2014  . Acute respiratory failure with hypoxemia ([place]) [J96.01] 11/22/2014  . Encounter for therapeutic drug monitoring [Z51.81] 08/31/2013  . Aftercare following surgery of the circulatory system, [place] [Z48.812] 12/21/2012  . Pain in limb [M79.609] 10/19/2012  .  Peripheral vascular disease ([place]) [I73.9] 09/24/2012  . Swollen leg [M79.89] 09/24/2012  . Varicose veins of lower extremities with other complications [N02.725] 36/64/4034  . 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 Arterial occlusion due to stenosis ([place]) [I77.1] 08/20/2012  . Wound infection after surgery [T81.49XA] 01/07/2012  . Compartment syndrome, nontraumatic, lower extremity [M79.A29] 11/29/2011  . Pain of right thigh [M79.651] 11/28/2011  . Hip fracture, right ([place]) [S72.001A] 11/15/2011  . Chronic atrial fibrillation ([place]) [I48.2] 11/15/2011  . Anticoagulant long-term use [Z79.01] 11/15/2011  . HTN (hypertension) [I10] 11/15/2011  . Hypothyroidism [E03.9] 11/15/2011  . Polycythemia vera ([place]) [D45] 10/01/2011    Past Medical History Past Medical History:  Diagnosis Date  . Anxiety   . Arthritis   . Atrial fibrillation, chronic (HCC)    a. on Coumadin for anticoagulation.   . Cellulitis 06/29/2017   RIGHT LOWER LEG  . CHF (congestive heart failure) ([place])    combined  . COPD (chronic obstructive pulmonary disease) ([place])    on home O2 4 lpm  . Deaf   . Dry gangrene ([place])    R foot  . DVT (deep venous thrombosis) ([place])   . Essential hypertension   . GERD (gastroesophageal reflux disease)   . HOH (hard of hearing)   . Hyperlipidemia   . Hypothyroidism   . Oxygen dependent    4 lpm  . Peripheral neuropathy   . Peripheral vascular disease ([place])   . 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 Ulcer of ankle ([place])    Bilateral 2015  . Varicose veins     Past Surgical History Past Surgical History:  Procedure Laterality Date  . ABDOMINAL AORTAGRAM N/A 09/14/2012   Procedure: ABDOMINAL [name];  Surgeon: Serafina Mitchell, MD;  Location: [facility] CATH LAB;  Service: Cardiovascular;  Laterality: N/A;  . ABDOMINAL AORTOGRAM N/A 03/06/2017   Procedure: ABDOMINAL AORTOGRAM;  Surgeon: Elam Dutch, MD;  Location: [facility] CV LAB;   Service: Cardiovascular;  Laterality: N/A;  . ABDOMINAL AORTOGRAM W/LOWER EXTREMITY N/A 09/21/2017   Procedure: ABDOMINAL AORTOGRAM W/LOWER EXTREMITY;  Surgeon: Waynetta Sandy, MD;  Location: [facility] CV LAB;  Service: Cardiovascular;  Laterality: N/A;  . ABDOMINAL HYSTERECTOMY    . AMPUTATION Right 09/29/2017   Procedure: DEBRIDEMENT TOE AMPUTATION RIGHT FIFTH TOE;  Surgeon: Angelia Mould, MD;  Location: [facility];  Service: Vascular;  Laterality: Right;  . APPLICATION OF WOUND VAC Right 09/29/2017   Procedure: APPLICATION OF WOUND VAC ON RIGHT FOOT;  Surgeon: Angelia Mould, MD;  Location: [facility];  Service: Vascular;  Laterality: Right;  . CATARACT EXTRACTION W/PHACO Right 08/31/2017   Procedure: CATARACT EXTRACTION PHACO AND INTRAOCULAR LENS PLACEMENT RIGHT EYE;  Surgeon: Tonny Branch, MD;  Location: [facility];  Service: Ophthalmology;  Laterality: Right;  CDE: 13.27  . CATARACT EXTRACTION W/PHACO Left 09/14/2017   Procedure: CATARACT EXTRACTION PHACO AND INTRAOCULAR LENS PLACEMENT LEFT EYE;  Surgeon: Tonny Branch, MD;  Location: [facility];  Service: Ophthalmology;  Laterality: Left;  CDE: 13.02  . CORONARY ANGIOPLASTY    . DRESSING CHANGE UNDER ANESTHESIA  12/02/2011   Procedure: DRESSING CHANGE  UNDER ANESTHESIA;  Surgeon: Carole Civil, MD;  Location: [facility];  Service: Orthopedics;  Laterality: Right;  . ENDARTERECTOMY FEMORAL Right 09/15/2012   Procedure: ENDARTERECTOMY FEMORAL;  Surgeon: Mal Misty, MD;  Location: [facility];  Service: Vascular;  Laterality: Right;  . FASCIOTOMY  11/29/2011   Procedure: FASCIOTOMY;  Surgeon: Carole Civil, MD;  Location: [facility];  Service: Orthopedics;  Laterality: Right;  right thigh   . FEMORAL-POPLITEAL BYPASS GRAFT Right 09/15/2012   Procedure: BYPASS GRAFT FEMORAL-POPLITEAL ARTERY;  Surgeon: Mal Misty, MD;  Location: [facility];  Service: Vascular;  Laterality: Right;  . FEMORAL-TIBIAL BYPASS GRAFT Right 03/11/2017   Procedure: RIGHT  FEMORAL-TIBIAL BYPASS IN-SITU GREATER SAPHENOUS VEIN;  Surgeon: Waynetta Sandy, MD;  Location: [facility];  Service: Vascular;  Laterality: Right;  . HIP PINNING,CANNULATED  11/19/2011   Procedure: CANNULATED HIP PINNING;  Surgeon: Sanjuana Kava, MD;  [facility];  Service: Orthopedics;  Laterality: Right;  . LOWER EXTREMITY ANGIOGRAPHY Bilateral 03/06/2017   Procedure: Lower Extremity Angiography;  Surgeon: Elam Dutch, MD;  Location: [facility] CV LAB;  Service: Cardiovascular;  Laterality: Bilateral;  . PATCH ANGIOPLASTY Right 09/15/2012   Procedure: PATCH ANGIOPLASTY;  Surgeon: Mal Misty, MD;  Location: [facility];  Service: Vascular;  Laterality: Right;  . TUBAL LIGATION      Family History Family History  Problem Relation Age of Onset  . Heart failure Mother   . Heart disease Mother   . Stroke Father   . Heart disease Sister   . Diabetes Son   . Hypertension Sister   . Hypertension Sister   . Stroke Sister      Social History  reports that she has never smoked. She has never used smokeless tobacco. She reports that she does not drink alcohol or use drugs.  Medications  Current Outpatient Medications:  .  aspirin EC 81 MG EC tablet, Take 1 tablet (81 mg total) by mouth daily., Disp: , Rfl:  .  diltiazem (CARDIZEM CD) 240 MG 24 hr capsule, Take 240 mg by mouth daily., Disp: , Rfl:  .  furosemide (LASIX) 40 MG tablet, Take 1 tablet (40 mg total) by mouth every other day. (Patient not taking: Reported on 11/06/2017), Disp: 30 tablet, Rfl:  .  furosemide (LASIX) 80 MG tablet, Take 40 mg by mouth daily., Disp: , Rfl:  .  gabapentin (NEURONTIN) 100 MG capsule, Take 100 mg by mouth 3 (three) times daily.  , Disp: , Rfl:  .  HYDROcodone-acetaminophen (NORCO/VICODIN) 5-325 MG tablet, Take 1 tablet by mouth every 4 (four) hours as needed for moderate pain. , Disp: , Rfl:  .  hydroxyurea (HYDREA) 500 MG capsule, Take 2 capsules (1,000 mg total) by mouth daily. May take with  food to minimize GI side effects., Disp: 60 capsule, Rfl: 2 .  ketorolac (ACULAR) 0.5 % ophthalmic solution, Place 1 drop into both eyes See admin instructions. Begin 3 days prior to surgery. Place 1 drop in right eye twice daily until 09/28/2017. Place into in the left eye twice daily beginning on 09/11/2017, Disp: , Rfl:  .  levothyroxine (SYNTHROID, LEVOTHROID) 75 MCG tablet, Take 1 tablet (75 mcg total) by mouth daily before breakfast., Disp: , Rfl:  .  ofloxacin (OCUFLOX) 0.3 % ophthalmic solution, Place 1 drop into the left eye See admin instructions. Begin 3 days prior to surgery. Place 1 drop in right eye 4 times daily. Place 1 drop in right eye morning of  surgery and continue 1 week after surgery. , Disp: , Rfl:  .  oxyCODONE-acetaminophen (PERCOCET/ROXICET) 5-325 MG tablet, Take 1-2 tablets by mouth every 6 (six) hours as needed for moderate pain. (Patient not taking: Reported on 11/06/2017), Disp: 8 tablet, Rfl: 0 .  oxyCODONE-acetaminophen (PERCOCET/ROXICET) 5-325 MG tablet, Take 1 tablet by mouth every 4 (four) hours as needed for severe pain. (Patient not taking: Reported on 11/06/2017), Disp: 30 tablet, Rfl: 0 .  pravastatin (PRAVACHOL) 10 MG tablet, Take 1 tablet (10 mg total) by mouth daily. (Patient not taking: Reported on 11/06/2017), Disp: , Rfl:  .  pravastatin (PRAVACHOL) 20 MG tablet, Take 10 mg by mouth daily., Disp: , Rfl:  .  prednisoLONE acetate (PRED FORTE) 1 % ophthalmic suspension, Place 1 drop into the right eye 3 (three) times daily. Will begin in left eye following second procedure., Disp: , Rfl:  .  torsemide (DEMADEX) 20 MG tablet, Take 1 tablet (20 mg total) by mouth daily as needed. May take for Weight gain of 3 lbs over 24 hours, Disp: 30 tablet, Rfl: 11 .  warfarin (COUMADIN) 2.5 MG tablet, Take 1 tablet (2.5 mg total) by mouth daily at 6 PM. Take one tablet all days except take 2.'5mg'$  on MWF, Disp: 10 tablet, Rfl: 0 .  warfarin (COUMADIN) 5 MG tablet, Take 1/2 tablet daily  except 1 tablet on Fridays or as directed, Disp: 71 tablet, Rfl: 6  Allergies Patient has no known allergies.  Review of Systems Review of Systems - Oncology ROS as per HPI otherwise 12 point ROS is negative.   Physical Exam  Vitals Wt Readings from Last 3 Encounters:  11/11/17 150 lb (68 kg)  11/06/17 149 lb (67.6 kg)  10/09/17 149 lb (67.6 kg)   Temp Readings from Last 3 Encounters:  11/11/17 97.6 F (36.4 C) (Tympanic)  11/06/17 (!) 96.5 F (35.8 C) (Oral)  10/09/17 (!) 97.1 F (36.2 C) (Oral)   BP Readings from Last 3 Encounters:  11/11/17 92/61  11/06/17 113/76  10/09/17 94/64   Pulse Readings from Last 3 Encounters:  11/11/17 (!) 101  11/06/17 (!) 106  10/09/17 83   Constitutional: Well-developed, well-nourished, and in no distress.   HENT: Head: Normocephalic and atraumatic.  Mouth/Throat: No oropharyngeal exudate. Mucosa moist. Eyes: Pupils are equal, round, and reactive to light. Conjunctivae are normal. No scleral icterus.  Neck: Normal range of motion. Neck supple. No JVD present.  Cardiovascular: Normal rate, regular rhythm and normal heart sounds.  Exam reveals no gallop and no friction rub.   No murmur heard. Pulmonary/Chest: Effort normal and breath sounds normal. No respiratory distress. No wheezes.No rales.  Abdominal: Soft. Bowel sounds are normal. No distension. There is no tenderness. There is no guarding.  Musculoskeletal: Wound VAC noted on lateral aspect of right foot. Lymphadenopathy:No cervical, axillary or supraclavicular adenopathy.  Neurological: Alert and oriented to person, place, and time. No cranial nerve deficit.  Skin: Skin is warm and dry. No rash noted. No erythema. No pallor.  Psychiatric: Affect and judgment normal.   Labs Appointment on 11/11/2017  Component Date Value Ref Range Status  . WBC 11/11/2017 7.5  4.0 - 10.5 K/uL Final  . RBC 11/11/2017 3.21* 3.87 - 5.11 MIL/uL Final  . Hemoglobin 11/11/2017 8.3* 12.0 - 15.0  g/dL Final  . HCT 11/11/2017 29.4* 36.0 - 46.0 % Final  . MCV 11/11/2017 91.6  78.0 - 100.0 fL Final  . MCH 11/11/2017 25.9* 26.0 - 34.0 pg Final  .  MCHC 11/11/2017 28.2* 30.0 - 36.0 g/dL Final  . RDW 11/11/2017 21.8* 11.5 - 15.5 % Final  . Platelets 11/11/2017 156  150 - 400 K/uL Final  . Neutrophils Relative % 11/11/2017 85  % Final  . Neutro Abs 11/11/2017 6.4  1.7 - 7.7 K/uL Final  . Lymphocytes Relative 11/11/2017 10  % Final  . Lymphs Abs 11/11/2017 0.7  0.7 - 4.0 K/uL Final  . Monocytes Relative 11/11/2017 3  % Final  . Monocytes Absolute 11/11/2017 0.2  0.1 - 1.0 K/uL Final  . Eosinophils Relative 11/11/2017 1  % Final  . Eosinophils Absolute 11/11/2017 0.1  0.0 - 0.7 K/uL Final  . Basophils Relative 11/11/2017 1  % Final  . Basophils Absolute 11/11/2017 0.1  0.0 - 0.1 K/uL Final  . RBC Morphology 11/11/2017 ANISOCYTES   Final   Comment: Schistocytes present BASOPHILIC STIPPLING POLYCHROMASIA PRESENT Performed at [facility], [address] [place], [place] [address]   . Sodium 11/11/2017 138  135 - 145 mmol/L Final  . Potassium 11/11/2017 4.0  3.5 - 5.1 mmol/L Final  . Chloride 11/11/2017 106  101 - 111 mmol/L Final  . CO2 11/11/2017 23  22 - 32 mmol/L Final  . Glucose, Bld 11/11/2017 104* 65 - 99 mg/dL Final  . BUN 11/11/2017 17  6 - 20 mg/dL Final  . Creatinine, Ser 11/11/2017 1.00  0.44 - 1.00 mg/dL Final  . Calcium 11/11/2017 8.7* 8.9 - 10.3 mg/dL Final  . Total Protein 11/11/2017 6.9  6.5 - 8.1 g/dL Final  . Albumin 11/11/2017 2.9* 3.5 - 5.0 g/dL Final  . AST 11/11/2017 14* 15 - 41 U/L Final  . ALT 11/11/2017 <5* 14 - 54 U/L Final  . Alkaline Phosphatase 11/11/2017 83  38 - 126 U/L Final  . Total Bilirubin 11/11/2017 0.9  0.3 - 1.2 mg/dL Final  . GFR calc non Af Amer 11/11/2017 54* >60 mL/min Final  . GFR calc Af Amer 11/11/2017 >60  >60 mL/min Final   Comment: (NOTE) The eGFR has been calculated using the CKD EPI equation. This calculation has not been  validated in all clinical situations. eGFR's persistently <60 mL/min signify possible Chronic Kidney Disease.   Georgiann Hahn gap 11/11/2017 9  5 - 15 Final   Performed at [facility], [address] [place], [place] [address]  Anti-coag visit on 11/09/2017  Component Date Value Ref Range Status  . INR 11/09/2017 2.3   Final     Pathology Orders Placed This Encounter  Procedures  . CBC with Differential/Platelet    Standing Status:   Future    Standing Expiration Date:   11/12/2018  . Comprehensive metabolic panel    Standing Status:   Future    Standing Expiration Date:   11/12/2018  . Lactate dehydrogenase    Standing Status:   Future    Standing Expiration Date:   11/12/2018  . Ferritin    Standing Status:   Future    Standing Expiration Date:   11/12/2018  . CBC with Differential/Platelet    Standing Status:   Future    Standing Expiration Date:   11/12/2018  . Comprehensive metabolic panel    Standing Status:   Future    Standing Expiration Date:   11/12/2018  . Lactate dehydrogenase    Standing Status:   Future    Standing Expiration Date:   11/12/2018  . Ferritin    Standing Status:   Future    Standing Expiration Date:  11/12/2018       Zoila Shutter MD

## 2017-11-13 DIAGNOSIS — I5032 Chronic diastolic (congestive) heart failure: Secondary | ICD-10-CM | POA: Diagnosis not present

## 2017-11-13 DIAGNOSIS — I739 Peripheral vascular disease, unspecified: Secondary | ICD-10-CM | POA: Diagnosis not present

## 2017-11-13 DIAGNOSIS — B9562 Methicillin resistant Staphylococcus aureus infection as the cause of diseases classified elsewhere: Secondary | ICD-10-CM | POA: Diagnosis not present

## 2017-11-13 DIAGNOSIS — T8743 Infection of amputation stump, right lower extremity: Secondary | ICD-10-CM | POA: Diagnosis not present

## 2017-11-13 DIAGNOSIS — I110 Hypertensive heart disease with heart failure: Secondary | ICD-10-CM | POA: Diagnosis not present

## 2017-11-13 DIAGNOSIS — I2510 Atherosclerotic heart disease of native coronary artery without angina pectoris: Secondary | ICD-10-CM | POA: Diagnosis not present

## 2017-11-16 DIAGNOSIS — B9562 Methicillin resistant Staphylococcus aureus infection as the cause of diseases classified elsewhere: Secondary | ICD-10-CM | POA: Diagnosis not present

## 2017-11-16 DIAGNOSIS — I2510 Atherosclerotic heart disease of native coronary artery without angina pectoris: Secondary | ICD-10-CM | POA: Diagnosis not present

## 2017-11-16 DIAGNOSIS — I739 Peripheral vascular disease, unspecified: Secondary | ICD-10-CM | POA: Diagnosis not present

## 2017-11-16 DIAGNOSIS — I110 Hypertensive heart disease with heart failure: Secondary | ICD-10-CM | POA: Diagnosis not present

## 2017-11-16 DIAGNOSIS — T8743 Infection of amputation stump, right lower extremity: Secondary | ICD-10-CM | POA: Diagnosis not present

## 2017-11-16 DIAGNOSIS — I5032 Chronic diastolic (congestive) heart failure: Secondary | ICD-10-CM | POA: Diagnosis not present

## 2017-11-18 DIAGNOSIS — I5032 Chronic diastolic (congestive) heart failure: Secondary | ICD-10-CM | POA: Diagnosis not present

## 2017-11-18 DIAGNOSIS — I2510 Atherosclerotic heart disease of native coronary artery without angina pectoris: Secondary | ICD-10-CM | POA: Diagnosis not present

## 2017-11-18 DIAGNOSIS — I110 Hypertensive heart disease with heart failure: Secondary | ICD-10-CM | POA: Diagnosis not present

## 2017-11-18 DIAGNOSIS — I739 Peripheral vascular disease, unspecified: Secondary | ICD-10-CM | POA: Diagnosis not present

## 2017-11-18 DIAGNOSIS — T8743 Infection of amputation stump, right lower extremity: Secondary | ICD-10-CM | POA: Diagnosis not present

## 2017-11-18 DIAGNOSIS — B9562 Methicillin resistant Staphylococcus aureus infection as the cause of diseases classified elsewhere: Secondary | ICD-10-CM | POA: Diagnosis not present

## 2017-11-20 DIAGNOSIS — B9562 Methicillin resistant Staphylococcus aureus infection as the cause of diseases classified elsewhere: Secondary | ICD-10-CM | POA: Diagnosis not present

## 2017-11-20 DIAGNOSIS — I739 Peripheral vascular disease, unspecified: Secondary | ICD-10-CM | POA: Diagnosis not present

## 2017-11-20 DIAGNOSIS — I5032 Chronic diastolic (congestive) heart failure: Secondary | ICD-10-CM | POA: Diagnosis not present

## 2017-11-20 DIAGNOSIS — I110 Hypertensive heart disease with heart failure: Secondary | ICD-10-CM | POA: Diagnosis not present

## 2017-11-20 DIAGNOSIS — I2510 Atherosclerotic heart disease of native coronary artery without angina pectoris: Secondary | ICD-10-CM | POA: Diagnosis not present

## 2017-11-20 DIAGNOSIS — T8743 Infection of amputation stump, right lower extremity: Secondary | ICD-10-CM | POA: Diagnosis not present

## 2017-11-23 ENCOUNTER — Telehealth: Payer: Self-pay | Admitting: *Deleted

## 2017-11-23 ENCOUNTER — Ambulatory Visit (INDEPENDENT_AMBULATORY_CARE_PROVIDER_SITE_OTHER): Payer: Medicare Other | Admitting: *Deleted

## 2017-11-23 DIAGNOSIS — T8743 Infection of amputation stump, right lower extremity: Secondary | ICD-10-CM | POA: Diagnosis not present

## 2017-11-23 DIAGNOSIS — Z5181 Encounter for therapeutic drug level monitoring: Secondary | ICD-10-CM | POA: Diagnosis not present

## 2017-11-23 DIAGNOSIS — I110 Hypertensive heart disease with heart failure: Secondary | ICD-10-CM | POA: Diagnosis not present

## 2017-11-23 DIAGNOSIS — I2510 Atherosclerotic heart disease of native coronary artery without angina pectoris: Secondary | ICD-10-CM | POA: Diagnosis not present

## 2017-11-23 DIAGNOSIS — I739 Peripheral vascular disease, unspecified: Secondary | ICD-10-CM | POA: Diagnosis not present

## 2017-11-23 DIAGNOSIS — I482 Chronic atrial fibrillation, unspecified: Secondary | ICD-10-CM

## 2017-11-23 DIAGNOSIS — B9562 Methicillin resistant Staphylococcus aureus infection as the cause of diseases classified elsewhere: Secondary | ICD-10-CM | POA: Diagnosis not present

## 2017-11-23 DIAGNOSIS — I5032 Chronic diastolic (congestive) heart failure: Secondary | ICD-10-CM | POA: Diagnosis not present

## 2017-11-23 LAB — POCT INR: INR: 2.4

## 2017-11-23 NOTE — Telephone Encounter (Signed)
INR 2.4 / please call with instructions / tg

## 2017-11-23 NOTE — Telephone Encounter (Signed)
Done.  See coumadin note. 

## 2017-11-23 NOTE — Patient Instructions (Signed)
Continue coumadin 2.5mg  daily except 5mg  on Mondays Recheck in 2 week Order given to [employer]

## 2017-11-25 DIAGNOSIS — T8743 Infection of amputation stump, right lower extremity: Secondary | ICD-10-CM | POA: Diagnosis not present

## 2017-11-25 DIAGNOSIS — I739 Peripheral vascular disease, unspecified: Secondary | ICD-10-CM | POA: Diagnosis not present

## 2017-11-25 DIAGNOSIS — I2510 Atherosclerotic heart disease of native coronary artery without angina pectoris: Secondary | ICD-10-CM | POA: Diagnosis not present

## 2017-11-25 DIAGNOSIS — I110 Hypertensive heart disease with heart failure: Secondary | ICD-10-CM | POA: Diagnosis not present

## 2017-11-25 DIAGNOSIS — B9562 Methicillin resistant Staphylococcus aureus infection as the cause of diseases classified elsewhere: Secondary | ICD-10-CM | POA: Diagnosis not present

## 2017-11-25 DIAGNOSIS — I5032 Chronic diastolic (congestive) heart failure: Secondary | ICD-10-CM | POA: Diagnosis not present

## 2017-11-27 DIAGNOSIS — I739 Peripheral vascular disease, unspecified: Secondary | ICD-10-CM | POA: Diagnosis not present

## 2017-11-27 DIAGNOSIS — I5032 Chronic diastolic (congestive) heart failure: Secondary | ICD-10-CM | POA: Diagnosis not present

## 2017-11-27 DIAGNOSIS — I2510 Atherosclerotic heart disease of native coronary artery without angina pectoris: Secondary | ICD-10-CM | POA: Diagnosis not present

## 2017-11-27 DIAGNOSIS — I110 Hypertensive heart disease with heart failure: Secondary | ICD-10-CM | POA: Diagnosis not present

## 2017-11-27 DIAGNOSIS — T8743 Infection of amputation stump, right lower extremity: Secondary | ICD-10-CM | POA: Diagnosis not present

## 2017-11-27 DIAGNOSIS — B9562 Methicillin resistant Staphylococcus aureus infection as the cause of diseases classified elsewhere: Secondary | ICD-10-CM | POA: Diagnosis not present

## 2017-11-30 DIAGNOSIS — I5032 Chronic diastolic (congestive) heart failure: Secondary | ICD-10-CM | POA: Diagnosis not present

## 2017-11-30 DIAGNOSIS — I2510 Atherosclerotic heart disease of native coronary artery without angina pectoris: Secondary | ICD-10-CM | POA: Diagnosis not present

## 2017-11-30 DIAGNOSIS — T8743 Infection of amputation stump, right lower extremity: Secondary | ICD-10-CM | POA: Diagnosis not present

## 2017-11-30 DIAGNOSIS — I110 Hypertensive heart disease with heart failure: Secondary | ICD-10-CM | POA: Diagnosis not present

## 2017-11-30 DIAGNOSIS — B9562 Methicillin resistant Staphylococcus aureus infection as the cause of diseases classified elsewhere: Secondary | ICD-10-CM | POA: Diagnosis not present

## 2017-11-30 DIAGNOSIS — I739 Peripheral vascular disease, unspecified: Secondary | ICD-10-CM | POA: Diagnosis not present

## 2017-12-01 DIAGNOSIS — T8743 Infection of amputation stump, right lower extremity: Secondary | ICD-10-CM | POA: Diagnosis not present

## 2017-12-01 DIAGNOSIS — I110 Hypertensive heart disease with heart failure: Secondary | ICD-10-CM | POA: Diagnosis not present

## 2017-12-01 DIAGNOSIS — I482 Chronic atrial fibrillation: Secondary | ICD-10-CM | POA: Diagnosis not present

## 2017-12-01 DIAGNOSIS — I2510 Atherosclerotic heart disease of native coronary artery without angina pectoris: Secondary | ICD-10-CM | POA: Diagnosis not present

## 2017-12-01 DIAGNOSIS — I5032 Chronic diastolic (congestive) heart failure: Secondary | ICD-10-CM | POA: Diagnosis not present

## 2017-12-01 DIAGNOSIS — B9562 Methicillin resistant Staphylococcus aureus infection as the cause of diseases classified elsewhere: Secondary | ICD-10-CM | POA: Diagnosis not present

## 2017-12-02 DIAGNOSIS — I5032 Chronic diastolic (congestive) heart failure: Secondary | ICD-10-CM | POA: Diagnosis not present

## 2017-12-02 DIAGNOSIS — B9562 Methicillin resistant Staphylococcus aureus infection as the cause of diseases classified elsewhere: Secondary | ICD-10-CM | POA: Diagnosis not present

## 2017-12-02 DIAGNOSIS — I2510 Atherosclerotic heart disease of native coronary artery without angina pectoris: Secondary | ICD-10-CM | POA: Diagnosis not present

## 2017-12-02 DIAGNOSIS — T8743 Infection of amputation stump, right lower extremity: Secondary | ICD-10-CM | POA: Diagnosis not present

## 2017-12-02 DIAGNOSIS — I482 Chronic atrial fibrillation: Secondary | ICD-10-CM | POA: Diagnosis not present

## 2017-12-02 DIAGNOSIS — I110 Hypertensive heart disease with heart failure: Secondary | ICD-10-CM | POA: Diagnosis not present

## 2017-12-04 DIAGNOSIS — I2510 Atherosclerotic heart disease of native coronary artery without angina pectoris: Secondary | ICD-10-CM | POA: Diagnosis not present

## 2017-12-04 DIAGNOSIS — B9562 Methicillin resistant Staphylococcus aureus infection as the cause of diseases classified elsewhere: Secondary | ICD-10-CM | POA: Diagnosis not present

## 2017-12-04 DIAGNOSIS — I110 Hypertensive heart disease with heart failure: Secondary | ICD-10-CM | POA: Diagnosis not present

## 2017-12-04 DIAGNOSIS — T8743 Infection of amputation stump, right lower extremity: Secondary | ICD-10-CM | POA: Diagnosis not present

## 2017-12-04 DIAGNOSIS — I5032 Chronic diastolic (congestive) heart failure: Secondary | ICD-10-CM | POA: Diagnosis not present

## 2017-12-04 DIAGNOSIS — I482 Chronic atrial fibrillation: Secondary | ICD-10-CM | POA: Diagnosis not present

## 2017-12-07 ENCOUNTER — Ambulatory Visit (INDEPENDENT_AMBULATORY_CARE_PROVIDER_SITE_OTHER): Payer: Medicare Other | Admitting: *Deleted

## 2017-12-07 ENCOUNTER — Telehealth: Payer: Self-pay | Admitting: *Deleted

## 2017-12-07 ENCOUNTER — Ambulatory Visit (INDEPENDENT_AMBULATORY_CARE_PROVIDER_SITE_OTHER): Payer: Self-pay | Admitting: Family

## 2017-12-07 ENCOUNTER — Encounter: Payer: Self-pay | Admitting: *Deleted

## 2017-12-07 ENCOUNTER — Encounter: Payer: Self-pay | Admitting: Family

## 2017-12-07 ENCOUNTER — Other Ambulatory Visit: Payer: Self-pay | Admitting: *Deleted

## 2017-12-07 VITALS — BP 104/60 | HR 84 | Temp 97.4°F | Resp 17 | Ht 72.0 in | Wt 150.0 lb

## 2017-12-07 DIAGNOSIS — S31109A Unspecified open wound of abdominal wall, unspecified quadrant without penetration into peritoneal cavity, initial encounter: Secondary | ICD-10-CM

## 2017-12-07 DIAGNOSIS — D45 Polycythemia vera: Secondary | ICD-10-CM

## 2017-12-07 DIAGNOSIS — I739 Peripheral vascular disease, unspecified: Secondary | ICD-10-CM

## 2017-12-07 DIAGNOSIS — B9562 Methicillin resistant Staphylococcus aureus infection as the cause of diseases classified elsewhere: Secondary | ICD-10-CM | POA: Diagnosis not present

## 2017-12-07 DIAGNOSIS — I482 Chronic atrial fibrillation, unspecified: Secondary | ICD-10-CM

## 2017-12-07 DIAGNOSIS — Z7901 Long term (current) use of anticoagulants: Secondary | ICD-10-CM

## 2017-12-07 DIAGNOSIS — T8743 Infection of amputation stump, right lower extremity: Secondary | ICD-10-CM | POA: Diagnosis not present

## 2017-12-07 DIAGNOSIS — I779 Disorder of arteries and arterioles, unspecified: Secondary | ICD-10-CM

## 2017-12-07 DIAGNOSIS — I5032 Chronic diastolic (congestive) heart failure: Secondary | ICD-10-CM | POA: Diagnosis not present

## 2017-12-07 DIAGNOSIS — I110 Hypertensive heart disease with heart failure: Secondary | ICD-10-CM | POA: Diagnosis not present

## 2017-12-07 DIAGNOSIS — I87303 Chronic venous hypertension (idiopathic) without complications of bilateral lower extremity: Secondary | ICD-10-CM

## 2017-12-07 DIAGNOSIS — Z5181 Encounter for therapeutic drug level monitoring: Secondary | ICD-10-CM | POA: Diagnosis not present

## 2017-12-07 DIAGNOSIS — I2510 Atherosclerotic heart disease of native coronary artery without angina pectoris: Secondary | ICD-10-CM | POA: Diagnosis not present

## 2017-12-07 DIAGNOSIS — Z95828 Presence of other vascular implants and grafts: Secondary | ICD-10-CM

## 2017-12-07 LAB — POCT INR: INR: 3

## 2017-12-07 MED ORDER — CEPHALEXIN 500 MG PO CAPS: 500.0000 mg | ORAL_CAPSULE | Freq: Three times a day (TID) | ORAL | 0 refills | Status: DC

## 2017-12-07 NOTE — Telephone Encounter (Signed)
-----   Message from Malen Gauze, RN sent at 12/07/2017  4:17 PM EDT ----- Regarding: RE: stopping Coumadin That is fine.  She can restart coumadin after procedure and home health will recheck INR 1 week after. Thanks, Lattie Haw ----- Message ----- From: Mena Goes, RN Sent: 12/07/2017   3:25 PM To: Malen Gauze, RN Subject: stopping Coumadin                              Mrs Gillette needs to have an exploration of her right groin wound on Thursday 12-10-17 by Dr. [name]. He has instructed her to stop Coumadin after today's dose. Just wanted you to be aware and to confirm that this is OK with you.   Thanks,  Leota Jacobsen, RN Surgical / Triage Nurse [facility]   [phone]

## 2017-12-07 NOTE — Progress Notes (Signed)
Postoperative Visit   History of Present Illness  Charlotte [name] is a 77 y.o. female who is s/p ray amputation of right fifth toe and placement of VAC on 09-29-17 by Dr. Scot Dock for nonhealing wound right fifth toe.  She has had several vascular procedures on her lower extremities, including Right femoral endarterectomy with Dacron patch angioplasty and profundoplasty and right femoral popliteal bypass graft using 6 mm Gore-Tex (above-knee) on 09-15-12 by Dr. Kellie Simmering.   Dr. [name] evaluated pt on 11-06-17. At that time right fifth toe amputation had good granulation, wound VAC was replaced.  She was to follow-up in 1 month for wound care check.   She returns today for wound check.  She denies fever or chills. She has intermittent right foot pain, takes po analgesics which helps.   Pt states right groin developed some swelling a few days ago, started draining shortly afterward, watery pink tinged drainage, started draining from right groin yesterday morning.  It appears that pt took Bactrim DS bid x 10 days upon discharge from the hospital on 10-01-17.   HH dresses her right groin since it started draining, and changes the wound vac dressing every 3 days per pt husband.   She takes coumadin for atrial fib.  She has polycythemia vera.   She does not have DM and has never used tobacco.   She has COPD and CHF, has supplemental O2 via [place].    For VQI Use Only  PRE-ADM LIVING: Home  AMB STATUS: Wheelchair    REVIEW OF SYSTEMS: See HPI for pertinent positives and negatives    Past Medical History:  Diagnosis Date  . Anxiety   . Arthritis   . Atrial fibrillation, chronic (HCC)    a. on Coumadin for anticoagulation.   . Cellulitis 06/29/2017   RIGHT LOWER LEG  . CHF (congestive heart failure) ([place])    combined  . COPD (chronic obstructive pulmonary disease) ([place])    on home O2 4 lpm  . Deaf   . 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 DVT (deep venous thrombosis) ([place])   .  Essential hypertension   . GERD (gastroesophageal reflux disease)   . HOH (hard of hearing)   . Hyperlipidemia   . Hypothyroidism   . Oxygen dependent    4 lpm  . Peripheral neuropathy   . Peripheral vascular disease ([place])   . 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 Ulcer of ankle ([place])    Bilateral 2015  . Varicose veins    Family History  Problem Relation Age of Onset  . Heart failure Mother   . Heart disease Mother   . Stroke Father   . Heart disease Sister   . Diabetes Son   . Hypertension Sister   . Hypertension Sister   . Stroke Sister      Past Surgical History:  Procedure Laterality Date  . ABDOMINAL AORTAGRAM N/A 09/14/2012   Procedure: ABDOMINAL [name];  Surgeon: Serafina Mitchell, MD;  Location: [facility] CATH LAB;  Service: Cardiovascular;  Laterality: N/A;  . ABDOMINAL AORTOGRAM N/A 03/06/2017   Procedure: ABDOMINAL AORTOGRAM;  Surgeon: Elam Dutch, MD;  Location: [place] CV LAB;  Service: Cardiovascular;  Laterality: N/A;  . ABDOMINAL AORTOGRAM W/LOWER EXTREMITY N/A 09/21/2017   Procedure: ABDOMINAL AORTOGRAM W/LOWER EXTREMITY;  Surgeon: Waynetta Sandy, MD;  Location: [place] CV LAB;  Service: Cardiovascular;  Laterality: N/A;  . ABDOMINAL HYSTERECTOMY    . AMPUTATION Right 09/29/2017  Procedure: DEBRIDEMENT TOE AMPUTATION RIGHT FIFTH TOE;  Surgeon: Angelia Mould, MD;  Location: [place];  Service: Vascular;  Laterality: Right;  . APPLICATION OF WOUND VAC Right 09/29/2017   Procedure: APPLICATION OF WOUND VAC ON RIGHT FOOT;  Surgeon: Angelia Mould, MD;  Location: [place];  Service: Vascular;  Laterality: Right;  . CATARACT EXTRACTION W/PHACO Right 08/31/2017   Procedure: CATARACT EXTRACTION PHACO AND INTRAOCULAR LENS PLACEMENT RIGHT EYE;  Surgeon: Tonny Branch, MD;  Location: [facility];  Service: Ophthalmology;  Laterality: Right;  CDE: 13.27  . CATARACT EXTRACTION W/PHACO Left 09/14/2017   Procedure: CATARACT EXTRACTION PHACO AND INTRAOCULAR  LENS PLACEMENT LEFT EYE;  Surgeon: Tonny Branch, MD;  Location: [facility];  Service: Ophthalmology;  Laterality: Left;  CDE: 13.02  . CORONARY ANGIOPLASTY    . DRESSING CHANGE UNDER ANESTHESIA  12/02/2011   Procedure: DRESSING CHANGE UNDER ANESTHESIA;  Surgeon: Carole Civil, MD;  Location: [facility];  Service: Orthopedics;  Laterality: Right;  . ENDARTERECTOMY FEMORAL Right 09/15/2012   Procedure: ENDARTERECTOMY FEMORAL;  Surgeon: Mal Misty, MD;  Location: [place];  Service: Vascular;  Laterality: Right;  . FASCIOTOMY  11/29/2011   Procedure: FASCIOTOMY;  Surgeon: Carole Civil, MD;  [facility];  Service: Orthopedics;  Laterality: Right;  right thigh   . FEMORAL-POPLITEAL BYPASS GRAFT Right 09/15/2012   Procedure: BYPASS GRAFT FEMORAL-POPLITEAL ARTERY;  Surgeon: Mal Misty, MD;  Location: [place];  Service: Vascular;  Laterality: Right;  . FEMORAL-TIBIAL BYPASS GRAFT Right 03/11/2017   Procedure: RIGHT FEMORAL-TIBIAL BYPASS IN-SITU GREATER SAPHENOUS VEIN;  Surgeon: Waynetta Sandy, MD;  Location: [place];  Service: Vascular;  Laterality: Right;  . HIP PINNING,CANNULATED  11/19/2011   Procedure: CANNULATED HIP PINNING;  Surgeon: Sanjuana Kava, MD;  [facility];  Service: Orthopedics;  Laterality: Right;  . LOWER EXTREMITY ANGIOGRAPHY Bilateral 03/06/2017   Procedure: Lower Extremity Angiography;  Surgeon: Elam Dutch, MD;  Location: [place] CV LAB;  Service: Cardiovascular;  Laterality: Bilateral;  . PATCH ANGIOPLASTY Right 09/15/2012   Procedure: PATCH ANGIOPLASTY;  Surgeon: Mal Misty, MD;  Location: [place];  Service: Vascular;  Laterality: Right;  . TUBAL LIGATION      Social History   Socioeconomic History  . Marital status: Married    Spouse name: Not on file  . Number of children: Not on file  . Years of education: Not on file  . Highest education level: Not on file  Occupational History  . Not on file  Social Needs  . Financial resource strain: Not  on file  . Food insecurity:    Worry: Not on file    Inability: Not on file  . Transportation needs:    Medical: Not on file    Non-medical: Not on file  Tobacco Use  . Smoking status: Never Smoker  . Smokeless tobacco: Never Used  Substance and Sexual Activity  . Alcohol use: No    Alcohol/week: 0.0 oz  . Drug use: No  . Sexual activity: Not Currently  Lifestyle  . Physical activity:    Days per week: Not on file    Minutes per session: Not on file  . Stress: Not on file  Relationships  . Social connections:    Talks on phone: Not on file    Gets together: Not on file    Attends religious service: Not on file    Active member of club or organization: Not on file  Attends meetings of clubs or organizations: Not on file    Relationship status: Not on file  . Intimate partner violence:    Fear of current or ex partner: Not on file    Emotionally abused: Not on file    Physically abused: Not on file    Forced sexual activity: Not on file  Other Topics Concern  . Not on file  Social History Narrative  . Not on file    No Known Allergies  Current Outpatient Medications on File Prior to Visit  Medication Sig Dispense Refill  . aspirin EC 81 MG EC tablet Take 1 tablet (81 mg total) by mouth daily.    Marland Kitchen diltiazem (CARDIZEM CD) 240 MG 24 hr capsule Take 240 mg by mouth daily.    . furosemide (LASIX) 40 MG tablet Take 1 tablet (40 mg total) by mouth every other day. 30 tablet   . furosemide (LASIX) 80 MG tablet Take 40 mg by mouth daily.    Marland Kitchen gabapentin (NEURONTIN) 100 MG capsule Take 100 mg by mouth 3 (three) times daily.      Marland Kitchen HYDROcodone-acetaminophen (NORCO/VICODIN) 5-325 MG tablet Take 1 tablet by mouth every 4 (four) hours as needed for moderate pain.     . hydroxyurea (HYDREA) 500 MG capsule Take 2 capsules (1,000 mg total) by mouth daily. May take with food to minimize GI side effects. 60 capsule 2  . ketorolac (ACULAR) 0.5 % ophthalmic solution Place 1 drop into  both eyes See admin instructions. Begin 3 days prior to surgery. Place 1 drop in right eye twice daily until 09/28/2017. Place into in the left eye twice daily beginning on 09/11/2017    . levothyroxine (SYNTHROID, LEVOTHROID) 75 MCG tablet Take 1 tablet (75 mcg total) by mouth daily before breakfast.    . ofloxacin (OCUFLOX) 0.3 % ophthalmic solution Place 1 drop into the left eye See admin instructions. Begin 3 days prior to surgery. Place 1 drop in right eye 4 times daily. Place 1 drop in right eye morning of surgery and continue 1 week after surgery.     Marland Kitchen oxyCODONE-acetaminophen (PERCOCET/ROXICET) 5-325 MG tablet Take 1-2 tablets by mouth every 6 (six) hours as needed for moderate pain. 8 tablet 0  . oxyCODONE-acetaminophen (PERCOCET/ROXICET) 5-325 MG tablet Take 1 tablet by mouth every 4 (four) hours as needed for severe pain. 30 tablet 0  . pravastatin (PRAVACHOL) 10 MG tablet Take 1 tablet (10 mg total) by mouth daily.    . pravastatin (PRAVACHOL) 20 MG tablet Take 10 mg by mouth daily.    . prednisoLONE acetate (PRED FORTE) 1 % ophthalmic suspension Place 1 drop into the right eye 3 (three) times daily. Will begin in left eye following second procedure.    . torsemide (DEMADEX) 20 MG tablet Take 1 tablet (20 mg total) by mouth daily as needed. May take for Weight gain of 3 lbs over 24 hours 30 tablet 11  . warfarin (COUMADIN) 2.5 MG tablet Take 1 tablet (2.5 mg total) by mouth daily at 6 PM. Take one tablet all days except take 2.5mg  on MWF 10 tablet 0  . warfarin (COUMADIN) 5 MG tablet Take 1/2 tablet daily except 1 tablet on Fridays or as directed 71 tablet 6   No current facility-administered medications on file prior to visit.      Physical Examination  Vitals:   12/07/17 1347  BP: 104/60  Pulse: 84  Resp: 17  Temp: (!) 97.4 F (36.3 C)  TempSrc: Oral  SpO2: 96%  Weight: 150 lb (68 kg)  Height: 6' (1.829 m)    Body mass index is 20.34 kg/m.  PHYSICAL  EXAMINATION: General: The patient appears her stated age, thin frail appearing female.   HEENT:  No gross abnormalities Pulmonary: Respirations are non-labored, supplemental O2 via [place] Abdomen: Soft and non-tender Musculoskeletal: Right 5th toe ray amputation site is granulating well with no signs of infection (see photo below). 1+ non pitting edema dorsal aspect right foot.   Neurologic: No focal weakness or paresthesias are detected. Is hard of hearing Skin: Hemosiderin deposits both lower legs in a gaiter pattern. Psychiatric: The patient has normal affect. Cardiovascular: Irregular rhythm, controlled rate    Right foot, lateral aspect   There is an opening in the right groin that tracks 4 cm proximally/laterally.  The opening measures 0.8 cm at the largest diameter. There is no erythema, no purulence, scant watery drainage.   Right femoral pulse is 2+ palpable, left is not palpable. Bilateral DP and PT pulses with brisk Doppler signals.   Right 5th toe ray amputation site is granulating well.   Medical Decision Making  CHRISTELLA APP is a 77 y.o. female who is s/p ray amputation of right fifth toe and placement of VAC on 09-29-17 by Dr. Scot Dock for nonhealing wound right fifth toe. She has had several vascular procedures on her lower extremities, including Right femoral endarterectomy with Dacron patch angioplasty and profundoplasty and right femoral popliteal bypass graft using 6 mm Gore-Tex (above-knee) on 09-15-12 by Dr. Kellie Simmering.    Dr. Trula Slade spoke with pt and husband and examined pt; he then spoke with Dr. [name].  Will schedule pf for exploration right groin wound on 12-10-17 by Dr. [name].   Keflex 500 mg po tid sent to her pharmacy.    Clemon Chambers, RN, MSN, FNP-C [facility] [place] Office: [phone]  12/07/2017, 2:10 PM  Clinic MD: Trula Slade

## 2017-12-07 NOTE — Patient Instructions (Signed)
Continue coumadin 2.5mg  daily except 5mg  on Mondays Recheck in 1 week Pending debridement of groin 5/9.  Pt to follow surgeons order if she needs to stop coumadin and restart night of procedure if coumadin is held. Order given to [employer] and son in law at pt's home

## 2017-12-07 NOTE — Telephone Encounter (Signed)
INR - 3.0 / please call with instructions /tg

## 2017-12-07 NOTE — Telephone Encounter (Signed)
Done.  See coumadin note. 

## 2017-12-08 ENCOUNTER — Encounter (HOSPITAL_COMMUNITY): Payer: Self-pay | Admitting: *Deleted

## 2017-12-08 ENCOUNTER — Other Ambulatory Visit: Payer: Self-pay

## 2017-12-08 ENCOUNTER — Other Ambulatory Visit (HOSPITAL_COMMUNITY): Payer: Self-pay | Admitting: *Deleted

## 2017-12-08 DIAGNOSIS — D45 Polycythemia vera: Secondary | ICD-10-CM

## 2017-12-08 MED ORDER — HYDROXYUREA 500 MG PO CAPS: 500.0000 mg | ORAL_CAPSULE | Freq: Every day | ORAL | 1 refills | Status: DC

## 2017-12-08 NOTE — Progress Notes (Signed)
SDW-Pre-op call completed by pt spouse, [name] (DPR). Spouse denies that pt c/o any acute cardiopulmonary issues. Spouse stated that pt is under the care of Dr. Bronson Ing, Cardiology. Spouse stated that pt last dose of Coumadin was Monday night. Spouse stated that pt was not provided pre-op instructions regarding Aspirin. Left voice message with Zigmund Daniel, Surgical Coordinator, regarding pre-op instructions; awaiting a return call. Spouse made aware to have pt stop taking vitamins fish oil and herbal medications. Do not take any NSAIDs ie: Ibuprofen, Advil, Naproxen (Aleve), Motrin, BC and Goody Powder. Spouse verbalized understanding of all pre-op instructions.

## 2017-12-08 NOTE — Progress Notes (Signed)
Charlotte [name], Surgical Coordinator, clarified that pt can take Aspirin on DOS. Pt spouse, [name] made aware and verbalized understanding.

## 2017-12-09 DIAGNOSIS — I2510 Atherosclerotic heart disease of native coronary artery without angina pectoris: Secondary | ICD-10-CM | POA: Diagnosis not present

## 2017-12-09 DIAGNOSIS — I5032 Chronic diastolic (congestive) heart failure: Secondary | ICD-10-CM | POA: Diagnosis not present

## 2017-12-09 DIAGNOSIS — I482 Chronic atrial fibrillation: Secondary | ICD-10-CM | POA: Diagnosis not present

## 2017-12-09 DIAGNOSIS — B9562 Methicillin resistant Staphylococcus aureus infection as the cause of diseases classified elsewhere: Secondary | ICD-10-CM | POA: Diagnosis not present

## 2017-12-09 DIAGNOSIS — T8743 Infection of amputation stump, right lower extremity: Secondary | ICD-10-CM | POA: Diagnosis not present

## 2017-12-09 DIAGNOSIS — I110 Hypertensive heart disease with heart failure: Secondary | ICD-10-CM | POA: Diagnosis not present

## 2017-12-10 ENCOUNTER — Ambulatory Visit (HOSPITAL_COMMUNITY): Payer: Medicare Other | Admitting: Certified Registered Nurse Anesthetist

## 2017-12-10 ENCOUNTER — Encounter (HOSPITAL_COMMUNITY): Payer: Self-pay | Admitting: *Deleted

## 2017-12-10 ENCOUNTER — Telehealth: Payer: Self-pay | Admitting: Vascular Surgery

## 2017-12-10 ENCOUNTER — Ambulatory Visit (HOSPITAL_COMMUNITY)
Admission: RE | Admit: 2017-12-10 | Discharge: 2017-12-10 | Disposition: A | Discharge status: Home / Self Care | Payer: Medicare Other | Source: Ambulatory Visit | Attending: Vascular Surgery | Admitting: Vascular Surgery

## 2017-12-10 ENCOUNTER — Encounter (HOSPITAL_COMMUNITY)
Admission: RE | Disposition: A | Discharge status: Home / Self Care | Payer: Self-pay | Source: Ambulatory Visit | Attending: Vascular Surgery

## 2017-12-10 DIAGNOSIS — B9562 Methicillin resistant Staphylococcus aureus infection as the cause of diseases classified elsewhere: Secondary | ICD-10-CM | POA: Diagnosis not present

## 2017-12-10 DIAGNOSIS — M1990 Unspecified osteoarthritis, unspecified site: Secondary | ICD-10-CM | POA: Insufficient documentation

## 2017-12-10 DIAGNOSIS — T8149XA Infection following a procedure, other surgical site, initial encounter: Secondary | ICD-10-CM

## 2017-12-10 DIAGNOSIS — I2510 Atherosclerotic heart disease of native coronary artery without angina pectoris: Secondary | ICD-10-CM | POA: Diagnosis not present

## 2017-12-10 DIAGNOSIS — Z86718 Personal history of other venous thrombosis and embolism: Secondary | ICD-10-CM | POA: Insufficient documentation

## 2017-12-10 DIAGNOSIS — Z961 Presence of intraocular lens: Secondary | ICD-10-CM | POA: Diagnosis not present

## 2017-12-10 DIAGNOSIS — N183 Chronic kidney disease, stage 3 (moderate): Secondary | ICD-10-CM | POA: Diagnosis not present

## 2017-12-10 DIAGNOSIS — K219 Gastro-esophageal reflux disease without esophagitis: Secondary | ICD-10-CM | POA: Diagnosis not present

## 2017-12-10 DIAGNOSIS — I739 Peripheral vascular disease, unspecified: Secondary | ICD-10-CM

## 2017-12-10 DIAGNOSIS — Z79899 Other long term (current) drug therapy: Secondary | ICD-10-CM

## 2017-12-10 DIAGNOSIS — Z7901 Long term (current) use of anticoagulants: Secondary | ICD-10-CM | POA: Insufficient documentation

## 2017-12-10 DIAGNOSIS — H9190 Unspecified hearing loss, unspecified ear: Secondary | ICD-10-CM | POA: Diagnosis not present

## 2017-12-10 DIAGNOSIS — Z7982 Long term (current) use of aspirin: Secondary | ICD-10-CM | POA: Insufficient documentation

## 2017-12-10 DIAGNOSIS — T8140XA Infection following a procedure, unspecified, initial encounter: Secondary | ICD-10-CM | POA: Diagnosis not present

## 2017-12-10 DIAGNOSIS — Z89421 Acquired absence of other right toe(s): Secondary | ICD-10-CM | POA: Insufficient documentation

## 2017-12-10 DIAGNOSIS — Y838 Other surgical procedures as the cause of abnormal reaction of the patient, or of later complication, without mention of misadventure at the time of the procedure: Secondary | ICD-10-CM

## 2017-12-10 DIAGNOSIS — F419 Anxiety disorder, unspecified: Secondary | ICD-10-CM | POA: Insufficient documentation

## 2017-12-10 DIAGNOSIS — L988 Other specified disorders of the skin and subcutaneous tissue: Secondary | ICD-10-CM

## 2017-12-10 DIAGNOSIS — Z9981 Dependence on supplemental oxygen: Secondary | ICD-10-CM

## 2017-12-10 DIAGNOSIS — T8579XA Infection and inflammatory reaction due to other internal prosthetic devices, implants and grafts, initial encounter: Secondary | ICD-10-CM | POA: Diagnosis not present

## 2017-12-10 DIAGNOSIS — D473 Essential (hemorrhagic) thrombocythemia: Secondary | ICD-10-CM | POA: Diagnosis not present

## 2017-12-10 DIAGNOSIS — I110 Hypertensive heart disease with heart failure: Secondary | ICD-10-CM

## 2017-12-10 DIAGNOSIS — I509 Heart failure, unspecified: Secondary | ICD-10-CM

## 2017-12-10 DIAGNOSIS — I482 Chronic atrial fibrillation: Secondary | ICD-10-CM

## 2017-12-10 DIAGNOSIS — Z8249 Family history of ischemic heart disease and other diseases of the circulatory system: Secondary | ICD-10-CM

## 2017-12-10 DIAGNOSIS — I5032 Chronic diastolic (congestive) heart failure: Secondary | ICD-10-CM | POA: Diagnosis not present

## 2017-12-10 DIAGNOSIS — E785 Hyperlipidemia, unspecified: Secondary | ICD-10-CM | POA: Diagnosis not present

## 2017-12-10 DIAGNOSIS — G629 Polyneuropathy, unspecified: Secondary | ICD-10-CM

## 2017-12-10 DIAGNOSIS — I9789 Other postprocedural complications and disorders of the circulatory system, not elsewhere classified: Secondary | ICD-10-CM | POA: Diagnosis not present

## 2017-12-10 DIAGNOSIS — I129 Hypertensive chronic kidney disease with stage 1 through stage 4 chronic kidney disease, or unspecified chronic kidney disease: Secondary | ICD-10-CM | POA: Diagnosis not present

## 2017-12-10 DIAGNOSIS — T8743 Infection of amputation stump, right lower extremity: Secondary | ICD-10-CM | POA: Diagnosis not present

## 2017-12-10 DIAGNOSIS — B951 Streptococcus, group B, as the cause of diseases classified elsewhere: Secondary | ICD-10-CM | POA: Diagnosis not present

## 2017-12-10 DIAGNOSIS — T8183XA Persistent postprocedural fistula, initial encounter: Secondary | ICD-10-CM

## 2017-12-10 DIAGNOSIS — J449 Chronic obstructive pulmonary disease, unspecified: Secondary | ICD-10-CM

## 2017-12-10 DIAGNOSIS — S31109D Unspecified open wound of abdominal wall, unspecified quadrant without penetration into peritoneal cavity, subsequent encounter: Secondary | ICD-10-CM | POA: Diagnosis not present

## 2017-12-10 DIAGNOSIS — E039 Hypothyroidism, unspecified: Secondary | ICD-10-CM

## 2017-12-10 DIAGNOSIS — I96 Gangrene, not elsewhere classified: Secondary | ICD-10-CM | POA: Diagnosis not present

## 2017-12-10 HISTORY — PX: GROIN DEBRIDEMENT: SHX5159

## 2017-12-10 HISTORY — DX: Other injury of unspecified body region, initial encounter: T14.8XXA

## 2017-12-10 LAB — BASIC METABOLIC PANEL
ANION GAP: 9 (ref 5–15)
BUN: 14 mg/dL (ref 6–20)
CO2: 23 mmol/L (ref 22–32)
Calcium: 8.8 mg/dL — ABNORMAL LOW (ref 8.9–10.3)
Chloride: 105 mmol/L (ref 101–111)
Creatinine, Ser: 0.98 mg/dL (ref 0.44–1.00)
GFR, EST NON AFRICAN AMERICAN: 55 mL/min — AB (ref >60)
Glucose, Bld: 80 mg/dL (ref 65–99)
POTASSIUM: 6.7 mmol/L — AB (ref 3.5–5.1)
SODIUM: 137 mmol/L (ref 135–145)

## 2017-12-10 LAB — CBC
HCT: 31.5 % — ABNORMAL LOW (ref 36.0–46.0)
HEMATOCRIT: 30.2 % — AB (ref 36.0–46.0)
Hemoglobin: 8.9 g/dL — ABNORMAL LOW (ref 12.0–15.0)
Hemoglobin: 8.3 g/dL — ABNORMAL LOW (ref 12.0–15.0)
MCH: 24 pg — AB (ref 26.0–34.0)
MCH: 23.4 pg — AB (ref 26.0–34.0)
MCHC: 28.3 g/dL — AB (ref 30.0–36.0)
MCHC: 27.5 g/dL — AB (ref 30.0–36.0)
MCV: 84.9 fL (ref 78.0–100.0)
MCV: 85.3 fL (ref 78.0–100.0)
Platelets: 1012 10*3/uL (ref 150–400)
Platelets: 832 10*3/uL — ABNORMAL HIGH (ref 150–400)
RBC: 3.71 MIL/uL — ABNORMAL LOW (ref 3.87–5.11)
RBC: 3.54 MIL/uL — ABNORMAL LOW (ref 3.87–5.11)
RDW: 23.3 % — AB (ref 11.5–15.5)
RDW: 22.1 % — ABNORMAL HIGH (ref 11.5–15.5)
WBC: 16.3 10*3/uL — ABNORMAL HIGH (ref 4.0–10.5)
WBC: 14.6 10*3/uL — AB (ref 4.0–10.5)

## 2017-12-10 LAB — PATHOLOGIST SMEAR REVIEW

## 2017-12-10 LAB — POCT I-STAT 4, (NA,K, GLUC, HGB,HCT)
Glucose, Bld: 77 mg/dL (ref 65–99)
HCT: 30 % — ABNORMAL LOW (ref 36.0–46.0)
Hemoglobin: 10.2 g/dL — ABNORMAL LOW (ref 12.0–15.0)
Potassium: 3.9 mmol/L (ref 3.5–5.1)
Sodium: 139 mmol/L (ref 135–145)

## 2017-12-10 LAB — PROTIME-INR
INR: 2.33
Prothrombin Time: 25.4 seconds — ABNORMAL HIGH (ref 11.4–15.2)

## 2017-12-10 SURGERY — DEBRIDEMENT, INGUINAL REGION
Anesthesia: General | Laterality: Right

## 2017-12-10 MED ORDER — SODIUM CHLORIDE 0.9 % IV SOLN
INTRAVENOUS | Status: AC
  Filled 2017-12-10: qty 1.2

## 2017-12-10 MED ORDER — DEXAMETHASONE SODIUM PHOSPHATE 4 MG/ML IJ SOLN
INTRAMUSCULAR | Status: DC | PRN
  Administered 2017-12-10: 8 mg via INTRAVENOUS

## 2017-12-10 MED ORDER — SUGAMMADEX SODIUM 200 MG/2ML IV SOLN
INTRAVENOUS | Status: DC | PRN
  Administered 2017-12-10: 200 mg via INTRAVENOUS

## 2017-12-10 MED ORDER — ROCURONIUM BROMIDE 100 MG/10ML IV SOLN
INTRAVENOUS | Status: DC | PRN
  Administered 2017-12-10: 40 mg via INTRAVENOUS

## 2017-12-10 MED ORDER — FENTANYL CITRATE (PF) 100 MCG/2ML IJ SOLN: 25.0000 ug | INTRAMUSCULAR | Status: DC | PRN

## 2017-12-10 MED ORDER — HYDROCODONE-ACETAMINOPHEN 5-325 MG PO TABS
ORAL_TABLET | ORAL | Status: AC
  Filled 2017-12-10: qty 1

## 2017-12-10 MED ORDER — SUGAMMADEX SODIUM 200 MG/2ML IV SOLN
INTRAVENOUS | Status: AC
  Filled 2017-12-10: qty 2

## 2017-12-10 MED ORDER — ROCURONIUM BROMIDE 50 MG/5ML IV SOLN
INTRAVENOUS | Status: AC
  Filled 2017-12-10: qty 1

## 2017-12-10 MED ORDER — CEFAZOLIN SODIUM-DEXTROSE 2-4 GM/100ML-% IV SOLN
2.0000 g | INTRAVENOUS | Status: AC
  Administered 2017-12-10: 2 g via INTRAVENOUS
  Filled 2017-12-10: qty 100

## 2017-12-10 MED ORDER — MIDAZOLAM HCL 5 MG/5ML IJ SOLN
INTRAMUSCULAR | Status: DC | PRN
  Administered 2017-12-10: 1 mg via INTRAVENOUS

## 2017-12-10 MED ORDER — CEPHALEXIN 500 MG PO CAPS: 500.0000 mg | ORAL_CAPSULE | Freq: Three times a day (TID) | ORAL | 0 refills | Status: DC

## 2017-12-10 MED ORDER — MIDAZOLAM HCL 2 MG/2ML IJ SOLN: 0.5000 mg | Freq: Once | INTRAMUSCULAR | Status: DC | PRN

## 2017-12-10 MED ORDER — MEPERIDINE HCL 50 MG/ML IJ SOLN: 6.2500 mg | INTRAMUSCULAR | Status: DC | PRN

## 2017-12-10 MED ORDER — HEPARIN SODIUM (PORCINE) 1000 UNIT/ML IJ SOLN
INTRAMUSCULAR | Status: AC
  Filled 2017-12-10: qty 1

## 2017-12-10 MED ORDER — SODIUM CHLORIDE 0.9 % IV SOLN: INTRAVENOUS | Status: DC

## 2017-12-10 MED ORDER — PROPOFOL 10 MG/ML IV BOLUS
INTRAVENOUS | Status: DC | PRN
  Administered 2017-12-10: 90 mg via INTRAVENOUS

## 2017-12-10 MED ORDER — FENTANYL CITRATE (PF) 100 MCG/2ML IJ SOLN
INTRAMUSCULAR | Status: DC | PRN
  Administered 2017-12-10 (×2): 50 ug via INTRAVENOUS

## 2017-12-10 MED ORDER — LIDOCAINE 2% (20 MG/ML) 5 ML SYRINGE
INTRAMUSCULAR | Status: AC
  Filled 2017-12-10: qty 5

## 2017-12-10 MED ORDER — 0.9 % SODIUM CHLORIDE (POUR BTL) OPTIME
TOPICAL | Status: DC | PRN
  Administered 2017-12-10: 1000 mL

## 2017-12-10 MED ORDER — HYDROCODONE-ACETAMINOPHEN 5-325 MG PO TABS: 1.0000 | ORAL_TABLET | ORAL | 0 refills | Status: DC | PRN

## 2017-12-10 MED ORDER — PHENYLEPHRINE 40 MCG/ML (10ML) SYRINGE FOR IV PUSH (FOR BLOOD PRESSURE SUPPORT)
PREFILLED_SYRINGE | INTRAVENOUS | Status: DC | PRN
  Administered 2017-12-10: 80 ug via INTRAVENOUS

## 2017-12-10 MED ORDER — PHENYLEPHRINE 40 MCG/ML (10ML) SYRINGE FOR IV PUSH (FOR BLOOD PRESSURE SUPPORT)
PREFILLED_SYRINGE | INTRAVENOUS | Status: AC
  Filled 2017-12-10: qty 10

## 2017-12-10 MED ORDER — ONDANSETRON HCL 4 MG/2ML IJ SOLN
INTRAMUSCULAR | Status: AC
  Filled 2017-12-10: qty 2

## 2017-12-10 MED ORDER — PROMETHAZINE HCL 25 MG/ML IJ SOLN: 6.2500 mg | INTRAMUSCULAR | Status: DC | PRN

## 2017-12-10 MED ORDER — PROPOFOL 10 MG/ML IV BOLUS
INTRAVENOUS | Status: AC
  Filled 2017-12-10: qty 20

## 2017-12-10 MED ORDER — EPHEDRINE SULFATE 50 MG/ML IJ SOLN
INTRAMUSCULAR | Status: AC
  Filled 2017-12-10: qty 1

## 2017-12-10 MED ORDER — FENTANYL CITRATE (PF) 250 MCG/5ML IJ SOLN
INTRAMUSCULAR | Status: AC
  Filled 2017-12-10: qty 5

## 2017-12-10 MED ORDER — MIDAZOLAM HCL 2 MG/2ML IJ SOLN
INTRAMUSCULAR | Status: AC
  Filled 2017-12-10: qty 2

## 2017-12-10 MED ORDER — DEXAMETHASONE SODIUM PHOSPHATE 10 MG/ML IJ SOLN
INTRAMUSCULAR | Status: AC
  Filled 2017-12-10: qty 1

## 2017-12-10 MED ORDER — HYDROCODONE-ACETAMINOPHEN 5-325 MG PO TABS
1.0000 | ORAL_TABLET | Freq: Once | ORAL | Status: AC
  Administered 2017-12-10: 1 via ORAL

## 2017-12-10 MED ORDER — LIDOCAINE 2% (20 MG/ML) 5 ML SYRINGE
INTRAMUSCULAR | Status: DC | PRN
  Administered 2017-12-10: 40 mg via INTRAVENOUS

## 2017-12-10 MED ORDER — LACTATED RINGERS IV SOLN
INTRAVENOUS | Status: DC
  Administered 2017-12-10: 08:00:00 via INTRAVENOUS

## 2017-12-10 MED ORDER — LACTATED RINGERS IV SOLN
INTRAVENOUS | Status: DC | PRN
  Administered 2017-12-10: 08:00:00 via INTRAVENOUS

## 2017-12-10 MED ORDER — ONDANSETRON HCL 4 MG/2ML IJ SOLN
INTRAMUSCULAR | Status: DC | PRN
  Administered 2017-12-10: 4 mg via INTRAVENOUS

## 2017-12-10 SURGICAL SUPPLY — 37 items
BANDAGE ACE 4X5 VEL STRL LF (GAUZE/BANDAGES/DRESSINGS) IMPLANT
BANDAGE ACE 6X5 VEL STRL LF (GAUZE/BANDAGES/DRESSINGS) IMPLANT
BNDG GAUZE ELAST 4 BULKY (GAUZE/BANDAGES/DRESSINGS) IMPLANT
CANISTER SUCT 3000ML PPV (MISCELLANEOUS) ×2 IMPLANT
COVER SURGICAL LIGHT HANDLE (MISCELLANEOUS) ×2 IMPLANT
DRAPE EXTREMITY T 121X128X90 (DRAPE) IMPLANT
DRAPE HALF SHEET 40X57 (DRAPES) IMPLANT
DRAPE INCISE IOBAN 66X45 STRL (DRAPES) IMPLANT
DRAPE ORTHO SPLIT 77X108 STRL (DRAPES)
DRAPE SURG ORHT 6 SPLT 77X108 (DRAPES) IMPLANT
DRSG ADAPTIC 3X8 NADH LF (GAUZE/BANDAGES/DRESSINGS) IMPLANT
DRSG VAC ATS SM SENSATRAC (GAUZE/BANDAGES/DRESSINGS) ×4 IMPLANT
DRSG VERSA FOAM LRG 10X15 (GAUZE/BANDAGES/DRESSINGS) ×2 IMPLANT
ELECT REM PT RETURN 9FT ADLT (ELECTROSURGICAL) ×2
ELECTRODE REM PT RTRN 9FT ADLT (ELECTROSURGICAL) ×1 IMPLANT
GLOVE BIO SURGEON STRL SZ7.5 (GLOVE) ×2 IMPLANT
GOWN STRL REUS W/ TWL LRG LVL3 (GOWN DISPOSABLE) ×2 IMPLANT
GOWN STRL REUS W/ TWL XL LVL3 (GOWN DISPOSABLE) ×1 IMPLANT
GOWN STRL REUS W/TWL LRG LVL3 (GOWN DISPOSABLE) ×2
GOWN STRL REUS W/TWL XL LVL3 (GOWN DISPOSABLE) ×1
KIT BASIN OR (CUSTOM PROCEDURE TRAY) ×2 IMPLANT
KIT TURNOVER KIT B (KITS) ×2 IMPLANT
NS IRRIG 1000ML POUR BTL (IV SOLUTION) ×2 IMPLANT
PACK GENERAL/GYN (CUSTOM PROCEDURE TRAY) IMPLANT
PAD ARMBOARD 7.5X6 YLW CONV (MISCELLANEOUS) ×4 IMPLANT
PAD NEG PRESSURE SENSATRAC (MISCELLANEOUS) ×2 IMPLANT
SPONGE GAUZE 4X4 12PLY STER LF (GAUZE/BANDAGES/DRESSINGS) ×2 IMPLANT
SUT ETHILON 3 0 PS 1 (SUTURE) IMPLANT
SUT VIC AB 2-0 CT1 27 (SUTURE)
SUT VIC AB 2-0 CT1 TAPERPNT 27 (SUTURE) IMPLANT
SUT VIC AB 3-0 SH 27 (SUTURE)
SUT VIC AB 3-0 SH 27X BRD (SUTURE) IMPLANT
SUT VICRYL 4-0 PS2 18IN ABS (SUTURE) IMPLANT
TOWEL GREEN STERILE (TOWEL DISPOSABLE) ×4 IMPLANT
TOWEL GREEN STERILE FF (TOWEL DISPOSABLE) ×2 IMPLANT
WATER STERILE IRR 1000ML POUR (IV SOLUTION) ×2 IMPLANT
WND VAC CANISTER 500ML (MISCELLANEOUS) ×4 IMPLANT

## 2017-12-10 NOTE — Telephone Encounter (Signed)
-----   Message from Mena Goes, RN sent at 12/10/2017  1:37 PM EDT ----- Regarding: [name] postop on 12-18-17   ----- Message ----- From: Ulyses Amor, PA-C Sent: 12/10/2017  11:10 AM To: Vvs Charge Pool  Debridement of right groin placement of wound vac Dr. [name] wants to see her in his clinic personally next Friday.  For wound check. 12/18/2017.

## 2017-12-10 NOTE — Op Note (Signed)
    Patient name: Charlotte [name] MRN: [id number] DOB: [birth year] Sex: female  12/10/2017 Pre-operative Diagnosis: right groin draining sinus tract Post-operative diagnosis:  Same Surgeon:  Erlene Quan C. [name], MD Assistant: Laurence Slate, PA Procedure Performed: Debridement of right groin to 7 x 1.5 x 2cm deep,placement of negative pressure dressing  Indications: 77 year old female has undergone a previous right femoral to tibial bypass and now a right fifth toe amputation with a slow healing wound.  She has a draining sinus tract in the right groin with an underlying proximal piece of graft that is attached to the vein distally.  She is now indicated for debridement possible intervention possible negative pressure dressing.  Findings: draining sinus tract was down to the level of the graft.  The graft itself was well incorporated.   Vein bypass was not identified but was palpable There was no pus.  Cultures were sent.  A wound VAC was placed with white sponge overlying the graft and black sponge above that.   Procedure:  The patient was identified in the holding area and taken to the operating room where she was placed supine on the operative table general anesthesia was induced.  She was sterilely prepped and draped in the right groin given antibiotics and timeout was called.  Following this we extended the draining sinus tract cephalad and then could identify that it tracked all the way cephalad the incision this was open to a total length of 7 cm.  We then debrided the wound back to healthy tissue.  The graft was incorporated deep in the wound so we did not dissected this out.  We irrigated the wound copiously after sending specimens.  We then placed a white sponge deep in the wound bed and black sponge above that.  Total wound size was 7 cm long by 1/2 cm wide by 2 cm deep.  She tolerated this procedure well without immediate complication.  EBL 10 cc.  Keelon Zurn C. [name], MD [facility] [place] Office: [phone] Pager: [phone]

## 2017-12-10 NOTE — H&P (Signed)
   History and Physical Update  The patient was interviewed and re-examined.  The patient's previous History and Physical has been reviewed and is unchanged from recent office visit with Ms. Nickel and Dr. Trula Slade. I have examined the right groin that demonstrates a draining sinus tract with concern for underlying graft involvement. Will explore sinus tract today with further need to be determined at time of surgery.  Bilan Tedesco C. [name], MD [facility] [place] Office: [phone] Pager: [phone]  12/10/2017, 7:22 AM '

## 2017-12-10 NOTE — Anesthesia Preprocedure Evaluation (Addendum)
Anesthesia Evaluation  Patient identified by MRN, date of birth, ID band Patient awake    Reviewed: Allergy & Precautions, NPO status , Patient's Chart, lab work & pertinent test results  History of Anesthesia Complications Negative for: history of anesthetic complications  Airway Mallampati: I  TM Distance: >3 FB Neck ROM: Full    Dental  (+) Edentulous Upper, Edentulous Lower   Pulmonary COPD,  oxygen dependent,    breath sounds clear to auscultation       Cardiovascular hypertension, Pt. on medications (-) angina+ Peripheral Vascular Disease and + DVT  + dysrhythmias Atrial Fibrillation  Rhythm:Regular Rate:Normal  '18 ECHO: The patient was in atrial fibrillation. Normal LV size, EF 60-65%. D-shaped interventricular septum, Moderately dilated RV with moderately decreased systolic function. Severe biatrial enlargement, Moderate pulmonary hypertension.  '18 stress: No reversible ischemia or infarction, Normal left ventricular wall motion, EF 69%    Neuro/Psych Anxiety negative neurological ROS     GI/Hepatic Neg liver ROS, GERD  Controlled,  Endo/Other  Hypothyroidism   Renal/GU negative Renal ROS     Musculoskeletal  (+) Arthritis ,   Abdominal   Peds  Hematology  (+) Blood dyscrasia (polycythemia vera), , Coumadin: last dose monday   Anesthesia Other Findings   Reproductive/Obstetrics                            Anesthesia Physical Anesthesia Plan  ASA: III  Anesthesia Plan: General   Post-op Pain Management:    Induction: Intravenous  PONV Risk Score and Plan: 3 and Ondansetron and Dexamethasone  Airway Management Planned: Oral ETT  Additional Equipment:   Intra-op Plan:   Post-operative Plan: Extubation in OR  Informed Consent: I have reviewed the patients History and Physical, chart, labs and discussed the procedure including the risks, benefits and alternatives for  the proposed anesthesia with the patient or authorized representative who has indicated his/her understanding and acceptance.     Plan Discussed with: CRNA and Surgeon  Anesthesia Plan Comments:         Anesthesia Quick Evaluation

## 2017-12-10 NOTE — Telephone Encounter (Signed)
sch appt 12/18/17 wound check 330pm waiting on pt to confirm

## 2017-12-10 NOTE — Progress Notes (Signed)
Have spoken with [employer] rep. Tommy- home wound VAC order faxed with needed paperwork- for home wound VAC approval- once approved- home wound VAC to be delivered to PACU prior to discharge home. - [facility] has confirmed pt is active and they will f/u for [facility] services. - please call [name] RN, CM- for any questions regarding discharge needs- [phone]

## 2017-12-10 NOTE — Progress Notes (Signed)
CRITICAL VALUE ALERT  Critical Value:  Platelets 1012  Date & Time Notied:  12/10/17; 0806  Provider Notified: Dr. Glennon Mac  Orders Received/Actions taken:  Received order to repeat CBC.   PT-INR was hemolyzed and repeating that as well.

## 2017-12-10 NOTE — Anesthesia Procedure Notes (Signed)
Procedure Name: Intubation Date/Time: 12/10/2017 10:22 AM Performed by: Orlie Dakin, CRNA Pre-anesthesia Checklist: Patient identified, Emergency Drugs available, Suction available, Patient being monitored and Timeout performed Patient Re-evaluated:Patient Re-evaluated prior to induction Oxygen Delivery Method: Circle system utilized Preoxygenation: Pre-oxygenation with 100% oxygen Induction Type: IV induction Ventilation: Mask ventilation without difficulty and Oral airway inserted - appropriate to patient size Laryngoscope Size: Sabra Heck and 3 Grade View: Grade I Tube type: Oral Tube size: 7.0 mm Number of attempts: 1 Airway Equipment and Method: Stylet Placement Confirmation: ETT inserted through vocal cords under direct vision,  positive ETCO2 and breath sounds checked- equal and bilateral Secured at: 22 cm Tube secured with: Tape Dental Injury: Teeth and Oropharynx as per pre-operative assessment  Comments: Noted scant amount of blood left corner of mouth due to dry lips after DL, lubricant applied.

## 2017-12-10 NOTE — Transfer of Care (Signed)
Immediate Anesthesia Transfer of Care Note  Patient: Charlotte [name]  Procedure(s) Performed: Virl Son DEBRIDEMENT EXPLORATION (Right )  Patient Location: PACU  Anesthesia Type:General  Level of Consciousness: awake, oriented and patient cooperative  Airway & Oxygen Therapy: Patient Spontanous Breathing and Patient connected to nasal cannula oxygen  Post-op Assessment: Report given to RN and Post -op Vital signs reviewed and stable  Post vital signs: Reviewed and stable  Last Vitals:  Vitals Value Taken Time  BP 114/69 12/10/2017 11:04 AM  Temp    Pulse 115 12/10/2017 11:05 AM  Resp 29 12/10/2017 11:05 AM  SpO2 96 % 12/10/2017 11:05 AM  Vitals shown include unvalidated device data.  Last Pain:  Vitals:   12/10/17 0729  TempSrc:   PainSc: 0-No pain      Patients Stated Pain Goal: 4 (55/73/22 0254)  Complications: No apparent anesthesia complications

## 2017-12-10 NOTE — Progress Notes (Signed)
Made Dr. Jenita Seashore aware of lab results.

## 2017-12-10 NOTE — Telephone Encounter (Signed)
     Called by [facility] nurse with report of active clotting interfering with VAC function.  Per nurse, no active bleeding but diffuse ooze.   Report to ED if bleeding becomes active or uncontrolled  Dry dressing to right groin  Contact office in AM (9 AM) to walk into Dr. Claretha Cooper office for re-evaluation  Instruction given to patient by [facility] nurse  Adele Barthel, MD, [place] [facility] [place] Office: [phone] Pager: [phone]  12/10/2017, 10:00 PM

## 2017-12-10 NOTE — Progress Notes (Signed)
Spoke with Rhys Martini- with [facility] - will have [place] do visit tomorrow 5/10 per DR. Cain request to check on pt's wound VAC.

## 2017-12-10 NOTE — Care Management Note (Addendum)
Case Management Note [name] RN, BSN Unit 4E-Case Manager-- coverage for PACU discharge [phone]  Patient Details  Name: Charlotte [name] MRN: [id number] Date of Birth: Jan 05, 1941  Subjective/Objective:  Pt presented for debridement of right groin with wound VAC placement                   Action/Plan: PTA Pt lived at home- has referral to  [facility] for [facility] services and home wound VAC needs post discharge.  Received call from Vascular PA- Gerri Lins- regarding need for home wound VAC post procedure today- [employer] wound VAC form signed and faxed to [facility] with supporting documents- call made to [facility] with [employer] for DME need- home wound VAC with plan to d/c from PACU once home VAC approved and delivered to PACU for discharge home- have spoken with Tiffany with [facility] to confirm pt has referral to them for [facility] services- they will f/u with pt on 5/10- for [facility] needs.  Have spoken with PACU RN Lovena Le regarding above Update 1440- spoke with Dawn at [facility]- pt has been approved for home wound VAC and it has been released for delivery - awaiting ETA for delivery- [employer] to deliver to PACU.    Expected Discharge Date:      12/10/17            Expected Discharge Plan:  [place]  In-House Referral:  NA  Discharge planning Services  CM Consult  Post Acute Care Choice:  Durable Medical Equipment, Home Health, Resumption of Svcs/PTA Provider Choice offered to:  Patient  DME Arranged:  Vac DME Agency:  [employer]  HH Arranged:  RN [place] Agency:  [facility]  Status of Service:  Completed, signed off  If discussed at [place] of Stay Meetings, dates discussed:    Discharge Disposition: home/home health   Additional Comments:  Dawayne Patricia, RN 12/10/2017, 2:57 PM

## 2017-12-10 NOTE — Progress Notes (Signed)
Noted blood pooling under patient's wound vac with machine alarming "blockage."  OR staff to bedside to assess dressing.  Dr. [name] was notified and saw patient at bedside after finishing case.  Dressing was changed by Dr. [name] at bedside, currently clean, dry, intact and draining properly.  Will continue to monitor.

## 2017-12-10 NOTE — Progress Notes (Signed)
Wound vac alarming "blockage" again.  Slight pooling noted around groin area.  Dr. [name] was notified and will see patient in PACU.

## 2017-12-10 NOTE — Anesthesia Postprocedure Evaluation (Signed)
Anesthesia Post Note  Patient: Charlotte [name]  Procedure(s) Performed: Virl Son DEBRIDEMENT EXPLORATION (Right )     Patient location during evaluation: PACU Anesthesia Type: General Level of consciousness: awake and alert, oriented and patient cooperative Pain management: pain level controlled Vital Signs Assessment: post-procedure vital signs reviewed and stable Respiratory status: spontaneous breathing, nonlabored ventilation, respiratory function stable and patient connected to nasal cannula oxygen Cardiovascular status: blood pressure returned to baseline and stable Postop Assessment: no apparent nausea or vomiting Anesthetic complications: no    Last Vitals:  Vitals:   12/10/17 1535 12/10/17 1558  BP: 107/66 108/64  Pulse: 89 90  Resp: 17 16  Temp:    SpO2: 98% 100%    Last Pain:  Vitals:   12/10/17 1558  TempSrc:   PainSc: 0-No pain                 Zarianna Dicarlo,E. Sigifredo Pignato

## 2017-12-10 NOTE — Progress Notes (Signed)
BMP hemolyzed. K 6.7   Dr. Jenita Seashore notified .  Will obtain a istat.

## 2017-12-11 ENCOUNTER — Other Ambulatory Visit (HOSPITAL_COMMUNITY): Payer: Medicare Other

## 2017-12-11 ENCOUNTER — Encounter (HOSPITAL_COMMUNITY): Payer: Self-pay | Admitting: Vascular Surgery

## 2017-12-11 ENCOUNTER — Inpatient Hospital Stay (HOSPITAL_COMMUNITY)
Admission: EM | Admit: 2017-12-11 | Discharge: 2017-12-15 | DRG: 920 | Disposition: A | Discharge status: Home Health | Payer: Medicare Other | Attending: Vascular Surgery | Admitting: Vascular Surgery

## 2017-12-11 DIAGNOSIS — I482 Chronic atrial fibrillation: Secondary | ICD-10-CM | POA: Diagnosis present

## 2017-12-11 DIAGNOSIS — K219 Gastro-esophageal reflux disease without esophagitis: Secondary | ICD-10-CM | POA: Diagnosis present

## 2017-12-11 DIAGNOSIS — E785 Hyperlipidemia, unspecified: Secondary | ICD-10-CM | POA: Diagnosis present

## 2017-12-11 DIAGNOSIS — Z7982 Long term (current) use of aspirin: Secondary | ICD-10-CM

## 2017-12-11 DIAGNOSIS — Z8249 Family history of ischemic heart disease and other diseases of the circulatory system: Secondary | ICD-10-CM | POA: Diagnosis not present

## 2017-12-11 DIAGNOSIS — Z79899 Other long term (current) drug therapy: Secondary | ICD-10-CM

## 2017-12-11 DIAGNOSIS — S31109A Unspecified open wound of abdominal wall, unspecified quadrant without penetration into peritoneal cavity, initial encounter: Secondary | ICD-10-CM | POA: Diagnosis present

## 2017-12-11 DIAGNOSIS — B951 Streptococcus, group B, as the cause of diseases classified elsewhere: Secondary | ICD-10-CM | POA: Diagnosis not present

## 2017-12-11 DIAGNOSIS — Z961 Presence of intraocular lens: Secondary | ICD-10-CM | POA: Diagnosis present

## 2017-12-11 DIAGNOSIS — I129 Hypertensive chronic kidney disease with stage 1 through stage 4 chronic kidney disease, or unspecified chronic kidney disease: Secondary | ICD-10-CM | POA: Diagnosis present

## 2017-12-11 DIAGNOSIS — Z7901 Long term (current) use of anticoagulants: Secondary | ICD-10-CM | POA: Diagnosis not present

## 2017-12-11 DIAGNOSIS — Y838 Other surgical procedures as the cause of abnormal reaction of the patient, or of later complication, without mention of misadventure at the time of the procedure: Secondary | ICD-10-CM | POA: Diagnosis present

## 2017-12-11 DIAGNOSIS — T8189XA Other complications of procedures, not elsewhere classified, initial encounter: Secondary | ICD-10-CM | POA: Diagnosis not present

## 2017-12-11 DIAGNOSIS — Z79890 Hormone replacement therapy (postmenopausal): Secondary | ICD-10-CM | POA: Diagnosis not present

## 2017-12-11 DIAGNOSIS — E039 Hypothyroidism, unspecified: Secondary | ICD-10-CM | POA: Diagnosis present

## 2017-12-11 DIAGNOSIS — I499 Cardiac arrhythmia, unspecified: Secondary | ICD-10-CM | POA: Diagnosis not present

## 2017-12-11 DIAGNOSIS — Y828 Other medical devices associated with adverse incidents: Secondary | ICD-10-CM | POA: Diagnosis present

## 2017-12-11 DIAGNOSIS — Z9981 Dependence on supplemental oxygen: Secondary | ICD-10-CM | POA: Diagnosis not present

## 2017-12-11 DIAGNOSIS — A491 Streptococcal infection, unspecified site: Secondary | ICD-10-CM

## 2017-12-11 DIAGNOSIS — T8140XA Infection following a procedure, unspecified, initial encounter: Secondary | ICD-10-CM | POA: Diagnosis not present

## 2017-12-11 DIAGNOSIS — I2510 Atherosclerotic heart disease of native coronary artery without angina pectoris: Secondary | ICD-10-CM | POA: Diagnosis not present

## 2017-12-11 DIAGNOSIS — Z9842 Cataract extraction status, left eye: Secondary | ICD-10-CM

## 2017-12-11 DIAGNOSIS — H9190 Unspecified hearing loss, unspecified ear: Secondary | ICD-10-CM | POA: Diagnosis present

## 2017-12-11 DIAGNOSIS — I96 Gangrene, not elsewhere classified: Secondary | ICD-10-CM | POA: Diagnosis present

## 2017-12-11 DIAGNOSIS — S91301A Unspecified open wound, right foot, initial encounter: Secondary | ICD-10-CM

## 2017-12-11 DIAGNOSIS — B9562 Methicillin resistant Staphylococcus aureus infection as the cause of diseases classified elsewhere: Secondary | ICD-10-CM | POA: Diagnosis not present

## 2017-12-11 DIAGNOSIS — I110 Hypertensive heart disease with heart failure: Secondary | ICD-10-CM | POA: Diagnosis not present

## 2017-12-11 DIAGNOSIS — N183 Chronic kidney disease, stage 3 (moderate): Secondary | ICD-10-CM | POA: Diagnosis present

## 2017-12-11 DIAGNOSIS — Z9841 Cataract extraction status, right eye: Secondary | ICD-10-CM

## 2017-12-11 DIAGNOSIS — I5032 Chronic diastolic (congestive) heart failure: Secondary | ICD-10-CM | POA: Diagnosis not present

## 2017-12-11 DIAGNOSIS — J449 Chronic obstructive pulmonary disease, unspecified: Secondary | ICD-10-CM | POA: Diagnosis present

## 2017-12-11 DIAGNOSIS — I739 Peripheral vascular disease, unspecified: Secondary | ICD-10-CM

## 2017-12-11 DIAGNOSIS — Z8614 Personal history of Methicillin resistant Staphylococcus aureus infection: Secondary | ICD-10-CM | POA: Diagnosis not present

## 2017-12-11 DIAGNOSIS — T8579XA Infection and inflammatory reaction due to other internal prosthetic devices, implants and grafts, initial encounter: Principal | ICD-10-CM | POA: Diagnosis present

## 2017-12-11 DIAGNOSIS — T8743 Infection of amputation stump, right lower extremity: Secondary | ICD-10-CM | POA: Diagnosis not present

## 2017-12-11 DIAGNOSIS — S31103A Unspecified open wound of abdominal wall, right lower quadrant without penetration into peritoneal cavity, initial encounter: Secondary | ICD-10-CM | POA: Diagnosis not present

## 2017-12-11 DIAGNOSIS — X58XXXA Exposure to other specified factors, initial encounter: Secondary | ICD-10-CM | POA: Diagnosis not present

## 2017-12-11 DIAGNOSIS — S31109D Unspecified open wound of abdominal wall, unspecified quadrant without penetration into peritoneal cavity, subsequent encounter: Secondary | ICD-10-CM | POA: Diagnosis present

## 2017-12-11 MED ORDER — VANCOMYCIN HCL 500 MG IV SOLR
500.0000 mg | Freq: Two times a day (BID) | INTRAVENOUS | Status: DC
  Administered 2017-12-12 – 2017-12-14 (×5): 500 mg via INTRAVENOUS
  Filled 2017-12-11 (×6): qty 500

## 2017-12-11 MED ORDER — ASPIRIN EC 81 MG PO TBEC
81.0000 mg | DELAYED_RELEASE_TABLET | Freq: Every day | ORAL | Status: DC
  Administered 2017-12-12 – 2017-12-15 (×4): 81 mg via ORAL
  Filled 2017-12-11 (×4): qty 1

## 2017-12-11 MED ORDER — WARFARIN SODIUM 5 MG PO TABS
5.0000 mg | ORAL_TABLET | ORAL | Status: DC
  Administered 2017-12-14: 5 mg via ORAL
  Filled 2017-12-11: qty 1

## 2017-12-11 MED ORDER — POTASSIUM CHLORIDE CRYS ER 20 MEQ PO TBCR: 20.0000 meq | EXTENDED_RELEASE_TABLET | Freq: Once | ORAL | Status: DC

## 2017-12-11 MED ORDER — MORPHINE SULFATE (PF) 2 MG/ML IV SOLN: 2.0000 mg | INTRAVENOUS | Status: DC | PRN

## 2017-12-11 MED ORDER — PHENOL 1.4 % MT LIQD: 1.0000 | OROMUCOSAL | Status: DC | PRN

## 2017-12-11 MED ORDER — ACETAMINOPHEN 500 MG PO TABS
1000.0000 mg | ORAL_TABLET | Freq: Once | ORAL | Status: AC
  Administered 2017-12-11: 1000 mg via ORAL
  Filled 2017-12-11: qty 2

## 2017-12-11 MED ORDER — SODIUM CHLORIDE 0.9% FLUSH
3.0000 mL | Freq: Two times a day (BID) | INTRAVENOUS | Status: DC
  Administered 2017-12-12 – 2017-12-15 (×6): 3 mL via INTRAVENOUS

## 2017-12-11 MED ORDER — SODIUM CHLORIDE 0.9 % IV SOLN: 250.0000 mL | INTRAVENOUS | Status: DC | PRN

## 2017-12-11 MED ORDER — ALUM & MAG HYDROXIDE-SIMETH 200-200-20 MG/5ML PO SUSP: 15.0000 mL | ORAL | Status: DC | PRN

## 2017-12-11 MED ORDER — WARFARIN - PHYSICIAN DOSING INPATIENT
Freq: Every day | Status: DC
  Administered 2017-12-12 – 2017-12-14 (×3)

## 2017-12-11 MED ORDER — PANTOPRAZOLE SODIUM 40 MG PO TBEC
40.0000 mg | DELAYED_RELEASE_TABLET | Freq: Every day | ORAL | Status: DC
  Administered 2017-12-12 – 2017-12-15 (×4): 40 mg via ORAL
  Filled 2017-12-11 (×4): qty 1

## 2017-12-11 MED ORDER — LEVOTHYROXINE SODIUM 25 MCG PO TABS
125.0000 ug | ORAL_TABLET | Freq: Every day | ORAL | Status: DC
  Administered 2017-12-12 – 2017-12-15 (×4): 125 ug via ORAL
  Filled 2017-12-11 (×4): qty 1

## 2017-12-11 MED ORDER — HYDROCODONE-ACETAMINOPHEN 5-325 MG PO TABS
1.0000 | ORAL_TABLET | ORAL | Status: DC | PRN
  Administered 2017-12-12 – 2017-12-15 (×8): 1 via ORAL
  Filled 2017-12-11 (×9): qty 1

## 2017-12-11 MED ORDER — WARFARIN SODIUM 2.5 MG PO TABS
2.5000 mg | ORAL_TABLET | ORAL | Status: DC
  Administered 2017-12-12 – 2017-12-13 (×2): 2.5 mg via ORAL
  Filled 2017-12-11 (×2): qty 1

## 2017-12-11 MED ORDER — SODIUM CHLORIDE 0.9% FLUSH: 3.0000 mL | INTRAVENOUS | Status: DC | PRN

## 2017-12-11 MED ORDER — HYDRALAZINE HCL 20 MG/ML IJ SOLN: 5.0000 mg | INTRAMUSCULAR | Status: DC | PRN

## 2017-12-11 MED ORDER — GUAIFENESIN-DM 100-10 MG/5ML PO SYRP: 15.0000 mL | ORAL_SOLUTION | ORAL | Status: DC | PRN

## 2017-12-11 MED ORDER — LABETALOL HCL 5 MG/ML IV SOLN: 10.0000 mg | INTRAVENOUS | Status: DC | PRN

## 2017-12-11 MED ORDER — PRAVASTATIN SODIUM 20 MG PO TABS
10.0000 mg | ORAL_TABLET | Freq: Every day | ORAL | Status: DC
  Administered 2017-12-12 – 2017-12-15 (×4): 10 mg via ORAL
  Filled 2017-12-11 (×4): qty 1

## 2017-12-11 MED ORDER — FUROSEMIDE 40 MG PO TABS
40.0000 mg | ORAL_TABLET | ORAL | Status: DC
  Administered 2017-12-12 – 2017-12-14 (×2): 40 mg via ORAL
  Filled 2017-12-11 (×4): qty 1

## 2017-12-11 MED ORDER — ONDANSETRON HCL 4 MG/2ML IJ SOLN: 4.0000 mg | Freq: Four times a day (QID) | INTRAMUSCULAR | Status: DC | PRN

## 2017-12-11 MED ORDER — METOPROLOL TARTRATE 5 MG/5ML IV SOLN: 2.0000 mg | INTRAVENOUS | Status: DC | PRN

## 2017-12-11 MED ORDER — DILTIAZEM HCL ER COATED BEADS 240 MG PO CP24
240.0000 mg | ORAL_CAPSULE | Freq: Every day | ORAL | Status: DC
  Administered 2017-12-12 – 2017-12-15 (×4): 240 mg via ORAL
  Filled 2017-12-11 (×4): qty 1

## 2017-12-11 MED ORDER — VANCOMYCIN HCL IN DEXTROSE 1-5 GM/200ML-% IV SOLN
1000.0000 mg | Freq: Once | INTRAVENOUS | Status: AC
  Administered 2017-12-11: 1000 mg via INTRAVENOUS
  Filled 2017-12-11: qty 200

## 2017-12-11 MED ORDER — HYDROXYUREA 500 MG PO CAPS
500.0000 mg | ORAL_CAPSULE | Freq: Every day | ORAL | Status: DC
  Administered 2017-12-12: 500 mg via ORAL
  Filled 2017-12-11: qty 1

## 2017-12-11 NOTE — ED Notes (Signed)
Pt's wound leaking through the dressing onto her dress.  Taken back to Pod A.  Dressing changed, new O2 tank given.  Pt did not want pain medicine at this time.

## 2017-12-11 NOTE — Progress Notes (Signed)
Pharmacy Antibiotic Note  Charlotte [name] is a 77 y.o. female admitted on 12/11/2017 with post-op infection.   Plan: Vanc 1 g x 1 then 500 q12 Monitor renal fx lot vt prn      Temp (24hrs), Avg:97.5 F (36.4 C), Min:97 F (36.1 C), Max:97.9 F (36.6 C)  Recent Labs  Lab 12/10/17 0727 12/10/17 0817  WBC 16.3* 14.6*  CREATININE 0.98  --     Estimated Creatinine Clearance: 53.2 mL/min (by C-G formula based on SCr of 0.98 mg/dL).    No Known Allergies  Levester Fresh, PharmD, BCPS, BCCCP Clinical Pharmacist Clinical phone for 12/11/2017 from 1430 [phone]: [phone] If after 2300, please call main pharmacy at: [phone] 12/11/2017 9:09 PM

## 2017-12-11 NOTE — ED Triage Notes (Signed)
Pt presents for re-evaluation of surgical site. Pt reports was d/c yesterday with wound vac to R groin, states when she got home the wound vac was removed by home health nurse due to error message on device. Pt called surgeon and he suggested she come in to be readmitted.

## 2017-12-11 NOTE — H&P (Signed)
Hospital Consult    Reason for Consult:  Bleeding right groin with malfunctioning wound vac Referring Physician: ED MRN #:  [id number]  History of Present Illness: This is a 77 y.o. female with a history of a fem tibial bypass with in situ vein although the proximal aspect is graft.  She had a draining sinus tract was taken to the operating room yesterday.  At time of operation her INR was 2.3.  Postoperatively she had some wound VAC dysfunction and this had to be changed at the bedside.  She was ultimately discharged.  On arrival home yesterday the wound VAC again began to dysfunction.  She is now here the wound VAC is been removed there is no excessive bleeding in the right groin per report.  She has been taking Keflex at home.  Past Medical History:  Diagnosis Date  . Anxiety   . Arthritis   . Atrial fibrillation, chronic (HCC)    a. on Coumadin for anticoagulation.   . Cellulitis 06/29/2017   RIGHT LOWER LEG  . CHF (congestive heart failure) ([place])    combined  . COPD (chronic obstructive pulmonary disease) ([place])    on home O2 4 lpm  . Deaf   . 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 DVT (deep venous thrombosis) ([place])   . Essential hypertension   . GERD (gastroesophageal reflux disease)   . HOH (hard of hearing)   . Hyperlipidemia   . Hypothyroidism   . Open wound    right groin  . Oxygen dependent    4 lpm  . Peripheral neuropathy   . Peripheral vascular disease ([place])   . 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 Ulcer of ankle ([place])    Bilateral 2015  . Varicose veins     Past Surgical History:  Procedure Laterality Date  . ABDOMINAL AORTAGRAM N/A 09/14/2012   Procedure: ABDOMINAL [name];  Surgeon: Serafina Mitchell, MD;  Location[facility] C[facility] LAB;  Service: Cardiovascular;  Laterality: N/A;  . ABDOMINAL AORTOGRAM N/A 03/06/2017   Procedure: ABDOMINAL AORTOGRAM;  Surgeon: Elam Dutch, MD;  Location: [place] CV LAB;  Service: Cardiovascular;  Laterality: N/A;  .  ABDOMINAL AORTOGRAM W/LOWER EXTREMITY N/A 09/21/2017   Procedure: ABDOMINAL AORTOGRAM W/LOWER EXTREMITY;  Surgeon: Waynetta Sandy, MD;  Location: [place] CV LAB;  Service: Cardiovascular;  Laterality: N/A;  . ABDOMINAL HYSTERECTOMY    . AMPUTATION Right 09/29/2017   Procedure: DEBRIDEMENT TOE AMPUTATION RIGHT FIFTH TOE;  Surgeon: Angelia Mould, MD;  Location: [facility];  Service: Vascular;  Laterality: Right;  . APPLICATION OF WOUND VAC Right 09/29/2017   Procedure: APPLICATION OF WOUND VAC ON RIGHT FOOT;  Surgeon: Angelia Mould, MD;  Location: [place];  Service: Vascular;  Laterality: Right;  . CATARACT EXTRACTION W/PHACO Right 08/31/2017   Procedure: CATARACT EXTRACTION PHACO AND INTRAOCULAR LENS PLACEMENT RIGHT EYE;  Surgeon: Tonny Branch, MD;  Location: [facility];  Service: Ophthalmology;  Laterality: Right;  CDE: 13.27  . CATARACT EXTRACTION W/PHACO Left 09/14/2017   Procedure: CATARACT EXTRACTION PHACO AND INTRAOCULAR LENS PLACEMENT LEFT EYE;  Surgeon: Tonny Branch, MD;  Location: [facility];  Service: Ophthalmology;  Laterality: Left;  CDE: 13.02  . CORONARY ANGIOPLASTY    . DRESSING CHANGE UNDER ANESTHESIA  12/02/2011   Procedure: DRESSING CHANGE UNDER ANESTHESIA;  Surgeon: Carole Civil, MD;  Location: [facility];  Service: Orthopedics;  Laterality: Right;  . ENDARTERECTOMY FEMORAL Right 09/15/2012   Procedure: ENDARTERECTOMY FEMORAL;  Surgeon:  Mal Misty, MD;  Location: [facility];  Service: Vascular;  Laterality: Right;  . FASCIOTOMY  11/29/2011   Procedure: FASCIOTOMY;  Surgeon: Carole Civil, MD;  Location: [facility];  Service: Orthopedics;  Laterality: Right;  right thigh   . FEMORAL-POPLITEAL BYPASS GRAFT Right 09/15/2012   Procedure: BYPASS GRAFT FEMORAL-POPLITEAL ARTERY;  Surgeon: Mal Misty, MD;  Location: [facility];  Service: Vascular;  Laterality: Right;  . FEMORAL-TIBIAL BYPASS GRAFT Right 03/11/2017   Procedure: RIGHT FEMORAL-TIBIAL BYPASS IN-SITU GREATER SAPHENOUS  VEIN;  Surgeon: Waynetta Sandy, MD;  Location: [facility];  Service: Vascular;  Laterality: Right;  . GROIN DEBRIDEMENT Right 12/10/2017   Procedure: GROIN DEBRIDEMENT EXPLORATION;  Surgeon: Waynetta Sandy, MD;  Location: [facility];  Service: Vascular;  Laterality: Right;  . HIP PINNING,CANNULATED  11/19/2011   Procedure: CANNULATED HIP PINNING;  Surgeon: Sanjuana Kava, MD;  Location: [facility];  Service: Orthopedics;  Laterality: Right;  . LOWER EXTREMITY ANGIOGRAPHY Bilateral 03/06/2017   Procedure: Lower Extremity Angiography;  Surgeon: Elam Dutch, MD;  Location: [facility] CV LAB;  Service: Cardiovascular;  Laterality: Bilateral;  . PATCH ANGIOPLASTY Right 09/15/2012   Procedure: PATCH ANGIOPLASTY;  Surgeon: Mal Misty, MD;  Location: [facility];  Service: Vascular;  Laterality: Right;  . TUBAL LIGATION      No Known Allergies  Prior to Admission medications   Medication Sig Start Date End Date Taking? Authorizing Provider  aspirin EC 81 MG EC tablet Take 1 tablet (81 mg total) by mouth daily. 06/02/17  Yes Johnson, Clanford L, MD  cephALEXin (KEFLEX) 500 MG capsule Take 1 capsule (500 mg total) by mouth 3 (three) times daily. 12/10/17  Yes Ulyses Amor, PA-C  diltiazem (CARDIZEM CD) 240 MG 24 hr capsule Take 240 mg by mouth daily.   Yes [provider]  furosemide (LASIX) 40 MG tablet Take 1 tablet (40 mg total) by mouth every other day. 06/01/17  Yes Johnson, Clanford L, MD  HYDROcodone-acetaminophen (NORCO/VICODIN) 5-325 MG tablet Take 1 tablet by mouth every 4 (four) hours as needed for moderate pain. 12/10/17  Yes Ulyses Amor, PA-C  hydroxyurea (HYDREA) 500 MG capsule Take 1 capsule (500 mg total) by mouth daily. May take with food to minimize GI side effects. 12/08/17  Yes Higgs, Mathis Dad, MD  levothyroxine (SYNTHROID, LEVOTHROID) 125 MCG tablet Take 125 mcg by mouth daily before breakfast.  11/03/17  Yes [provider]  pravastatin (PRAVACHOL) 20 MG tablet  Take 10 mg by mouth daily. 08/08/17  Yes [provider]  torsemide (DEMADEX) 20 MG tablet Take 1 tablet (20 mg total) by mouth daily as needed. May take for Weight gain of 3 lbs over 24 hours 09/22/17 12/21/17 Yes Herminio Commons, MD  warfarin (COUMADIN) 5 MG tablet Take 1/2 tablet daily except 1 tablet on Fridays or as directed Patient taking differently: Take 2.5-5 mg by mouth See admin instructions. 5 mg on Monday, all other days 2.5 mg 10/14/17  Yes Herminio Commons, MD    Social History   Socioeconomic History  . Marital status: Married    Spouse name: Not on file  . Number of children: Not on file  . Years of education: Not on file  . Highest education level: Not on file  Occupational History  . Not on file  Social Needs  . Financial resource strain: Not on file  . Food insecurity:    Worry: Not on file    Inability: Not  on file  . Transportation needs:    Medical: Not on file    Non-medical: Not on file  Tobacco Use  . Smoking status: Never Smoker  . Smokeless tobacco: Never Used  Substance and Sexual Activity  . Alcohol use: No    Alcohol/week: 0.0 oz  . Drug use: No  . Sexual activity: Not Currently  Lifestyle  . Physical activity:    Days per week: Not on file    Minutes per session: Not on file  . Stress: Not on file  Relationships  . Social connections:    Talks on phone: Not on file    Gets together: Not on file    Attends religious service: Not on file    Active member of club or organization: Not on file    Attends meetings of clubs or organizations: Not on file    Relationship status: Not on file  . Intimate partner violence:    Fear of current or ex partner: Not on file    Emotionally abused: Not on file    Physically abused: Not on file    Forced sexual activity: Not on file  Other Topics Concern  . Not on file  Social History Narrative  . Not on file     Family History  Problem Relation Age of Onset  . Heart failure Mother     . Heart disease Mother   . Stroke Father   . Heart disease Sister   . Diabetes Son   . Hypertension Sister   . Hypertension Sister   . Stroke Sister     ROS: Denies fevers or chills Right groin pain Right foot pain  Physical Examination  Vitals:   12/11/17 2049 12/11/17 2100  BP: 137/74   Pulse: 98 80  Resp: 19 16  Temp: (!) 97 F (36.1 C)   SpO2: 100% 100%   There is no height or weight on file to calculate BMI.  Awake alert and oriented Nonlabored respirations Right groin wound is open there is no frank bleeding there is no pus or other drainage.  A new dry dressing was placed Right foot dressing was changed appears healthy with bleeding.  CBC    Component Value Date/Time   WBC 14.6 (H) 12/10/2017 0817   RBC 3.54 (L) 12/10/2017 0817   HGB 10.2 (L) 12/10/2017 0843   HCT 30.0 (L) 12/10/2017 0843   PLT 832 (H) 12/10/2017 0817   MCV 85.3 12/10/2017 0817   MCH 23.4 (L) 12/10/2017 0817   MCHC 27.5 (L) 12/10/2017 0817   RDW 22.1 (H) 12/10/2017 0817   LYMPHSABS 0.7 11/11/2017 0842   MONOABS 0.2 11/11/2017 0842   EOSABS 0.1 11/11/2017 0842   BASOSABS 0.1 11/11/2017 0842    BMET    Component Value Date/Time   NA 139 12/10/2017 0843   K 3.9 12/10/2017 0843   CL 105 12/10/2017 0727   CO2 23 12/10/2017 0727   GLUCOSE 77 12/10/2017 0843   BUN 14 12/10/2017 0727   CREATININE 0.98 12/10/2017 0727   CREATININE 1.21 (H) 08/14/2016 0733   CALCIUM 8.8 (L) 12/10/2017 0727   GFRNONAA 55 (L) 12/10/2017 0727   GFRAA >60 12/10/2017 0727    COAGS: Lab Results  Component Value Date   INR 2.33 12/10/2017   INR 3.0 12/07/2017   INR 2.4 11/23/2017     Non-Invasive Vascular Imaging:   No studies   ASSESSMENT/PLAN: This is a 77 y.o. female status post debridement of right groin for  sinus tract.  A wound VAC was placed but that is not keeping it suction and is clotting off.  We will transition to wet-to-dry dressings which I will change in the morning.  We will also  start antibiotics.  Continue Coumadin as no further operations are planned.  She is out of bed as tolerated and we will give her a diet as well.  Terrance Lanahan C. [name], MD [facility] Office: [phone] Pager: [phone]

## 2017-12-11 NOTE — ED Provider Notes (Signed)
[facility] EMERGENCY DEPARTMENT Provider Note   CSN: [id number] Arrival date & time: 12/11/17  1314     History   Chief Complaint Chief Complaint  Patient presents with  . Post-op Problem    HPI Charlotte [name] is a 77 y.o. female with a history of PVD, A. fib on Coumadin, CHF, COPD on 4 L of home O2, DVT, HTN, osteomyelitis and dry gangrene of the right foot who presents to the emergency department with a chief complaint of right groin wound.  The patient was taken to the OR yesterday by Dr. [name] where a wound VAC was placed.  After the procedure, she had dysfunction with a wound VAC, which was changed at bedside.  She was discharged home, where she had additional dysfunction with the wound back. She spoke with Dr. [name] earlier today who advised her to return to the emergency department for reevaluation.  She denies chest pain, dyspnea, fever, chills, vomiting, or back pain. She has been taking Keflex at home.    The history is provided by the patient. No language interpreter was used.    Past Medical History:  Diagnosis Date  . Anxiety   . Arthritis   . Atrial fibrillation, chronic (HCC)    a. on Coumadin for anticoagulation.   . Cellulitis 06/29/2017   RIGHT LOWER LEG  . CHF (congestive heart failure) ([place])    combined  . COPD (chronic obstructive pulmonary disease) ([place])    on home O2 4 lpm  . Deaf   . Dry gangrene ([place])    R foot  . DVT (deep venous thrombosis) ([place])   . Essential hypertension   . GERD (gastroesophageal reflux disease)   . HOH (hard of hearing)   . Hyperlipidemia   . Hypothyroidism   . Open wound    right groin  . Oxygen dependent    4 lpm  . Peripheral neuropathy   . Peripheral vascular disease ([place])   . 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 Ulcer of ankle ([place])    Bilateral 2015  . Varicose veins     Patient Active Problem List   Diagnosis Date Noted  . Right groin wound 12/11/2017  . CKD (chronic kidney  disease) stage 3, GFR 30-59 ml/min (HCC)   . Foot pain, right   . Cellulitis and abscess of right leg 06/27/2017  . Dry gangrene ([place]) 06/27/2017  . Hypokalemia 06/27/2017  . Osteomyelitis of right foot ([place]) 06/27/2017  . S/P femoral-popliteal bypass surgery   . Edema of right lower extremity 06/08/2017  . GERD (gastroesophageal reflux disease) 06/08/2017  . Pressure ulcer 05/29/2017  . Sepsis ([place]) 05/28/2017  . Hypotension 05/28/2017  . Pressure injury of skin 03/14/2017  . Acute renal failure (ARF) ([place])   . Hyperlipidemia 03/05/2017  . Lower extremity arterial insufficiency, severe, right ([place]) 03/04/2017  . Chronic respiratory failure with hypoxia ([place]) 02/12/2017  . Cellulitis 07/03/2016  . Anemia 07/03/2016  . Cough 11/29/2015  . Hypoxemia 11/29/2015  . CHF (congestive heart failure) ([place]) 11/29/2015  . Acute on chronic respiratory failure with hypoxia ([place])   . Pleural effusion on left   . Acute on chronic diastolic (congestive) heart failure ([place])   . Dyspnea 11/22/2014  . Pleural effusion 11/22/2014  . Hard of hearing 11/22/2014  . Thrombocytosis ([place]) 11/22/2014  . Acute respiratory failure with hypoxemia ([place]) 11/22/2014  . Encounter for therapeutic drug monitoring 08/31/2013  . Aftercare following surgery of the circulatory system, [place] 12/21/2012  .  Pain in limb 10/19/2012  . Peripheral vascular disease ([place]) 09/24/2012  . Swollen leg 09/24/2012  . Varicose veins of lower extremities with other complications 62/94/7654  . 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 Arterial occlusion due to stenosis ([place]) 08/20/2012  . Wound infection after surgery 01/07/2012  . Compartment syndrome, nontraumatic, lower extremity 11/29/2011  . Pain of right thigh 11/28/2011  . Hip fracture, right ([place]) 11/15/2011  . Chronic atrial fibrillation ([place]) 11/15/2011  . Anticoagulant long-term use 11/15/2011  . HTN (hypertension) 11/15/2011  .  Hypothyroidism 11/15/2011  . Polycythemia vera ([place]) 10/01/2011    Past Surgical History:  Procedure Laterality Date  . ABDOMINAL AORTAGRAM N/A 09/14/2012   Procedure: ABDOMINAL [name];  Surgeon: Serafina Mitchell, MD;  Location: [facility] CATH LAB;  Service: Cardiovascular;  Laterality: N/A;  . ABDOMINAL AORTOGRAM N/A 03/06/2017   Procedure: ABDOMINAL AORTOGRAM;  Surgeon: Elam Dutch, MD;  Location: [facility] CV LAB;  Service: Cardiovascular;  Laterality: N/A;  . ABDOMINAL AORTOGRAM W/LOWER EXTREMITY N/A 09/21/2017   Procedure: ABDOMINAL AORTOGRAM W/LOWER EXTREMITY;  Surgeon: Waynetta Sandy, MD;  Location: [facility] CV LAB;  Service: Cardiovascular;  Laterality: N/A;  . ABDOMINAL HYSTERECTOMY    . AMPUTATION Right 09/29/2017   Procedure: DEBRIDEMENT TOE AMPUTATION RIGHT FIFTH TOE;  Surgeon: Angelia Mould, MD;  Location: [facility];  Service: Vascular;  Laterality: Right;  . APPLICATION OF WOUND VAC Right 09/29/2017   Procedure: APPLICATION OF WOUND VAC ON RIGHT FOOT;  Surgeon: Angelia Mould, MD;  Location: [facility];  Service: Vascular;  Laterality: Right;  . CATARACT EXTRACTION W/PHACO Right 08/31/2017   Procedure: CATARACT EXTRACTION PHACO AND INTRAOCULAR LENS PLACEMENT RIGHT EYE;  Surgeon: Tonny Branch, MD;  Location: [facility];  Service: Ophthalmology;  Laterality: Right;  CDE: 13.27  . CATARACT EXTRACTION W/PHACO Left 09/14/2017   Procedure: CATARACT EXTRACTION PHACO AND INTRAOCULAR LENS PLACEMENT LEFT EYE;  Surgeon: Tonny Branch, MD;  Location: [facility];  Service: Ophthalmology;  Laterality: Left;  CDE: 13.02  . CORONARY ANGIOPLASTY    . DRESSING CHANGE UNDER ANESTHESIA  12/02/2011   Procedure: DRESSING CHANGE UNDER ANESTHESIA;  Surgeon: Carole Civil, MD;  Location: [facility];  Service: Orthopedics;  Laterality: Right;  . ENDARTERECTOMY FEMORAL Right 09/15/2012   Procedure: ENDARTERECTOMY FEMORAL;  Surgeon: Mal Misty, MD;  Location: [facility];  Service: Vascular;   Laterality: Right;  . FASCIOTOMY  11/29/2011   Procedure: FASCIOTOMY;  Surgeon: Carole Civil, MD;  Location: [facility];  Service: Orthopedics;  Laterality: Right;  right thigh   . FEMORAL-POPLITEAL BYPASS GRAFT Right 09/15/2012   Procedure: BYPASS GRAFT FEMORAL-POPLITEAL ARTERY;  Surgeon: Mal Misty, MD;  Location: [facility];  Service: Vascular;  Laterality: Right;  . FEMORAL-TIBIAL BYPASS GRAFT Right 03/11/2017   Procedure: RIGHT FEMORAL-TIBIAL BYPASS IN-SITU GREATER SAPHENOUS VEIN;  Surgeon: Waynetta Sandy, MD;  Location: [facility];  Service: Vascular;  Laterality: Right;  . GROIN DEBRIDEMENT Right 12/10/2017   Procedure: GROIN DEBRIDEMENT EXPLORATION;  Surgeon: Waynetta Sandy, MD;  Location: [facility];  Service: Vascular;  Laterality: Right;  . HIP PINNING,CANNULATED  11/19/2011   Procedure: CANNULATED HIP PINNING;  Surgeon: Sanjuana Kava, MD;  Location: [facility];  Service: Orthopedics;  Laterality: Right;  . LOWER EXTREMITY ANGIOGRAPHY Bilateral 03/06/2017   Procedure: Lower Extremity Angiography;  Surgeon: Elam Dutch, MD;  Location: [facility] CV LAB;  Service: Cardiovascular;  Laterality: Bilateral;  . PATCH ANGIOPLASTY Right 09/15/2012  Procedure: PATCH ANGIOPLASTY;  Surgeon: Mal Misty, MD;  Location: [facility];  Service: Vascular;  Laterality: Right;  . TUBAL LIGATION       OB History   None      Home Medications    Prior to Admission medications   Medication Sig Start Date End Date Taking? Authorizing Provider  aspirin EC 81 MG EC tablet Take 1 tablet (81 mg total) by mouth daily. 06/02/17  Yes Johnson, Clanford L, MD  cephALEXin (KEFLEX) 500 MG capsule Take 1 capsule (500 mg total) by mouth 3 (three) times daily. 12/10/17  Yes Ulyses Amor, PA-C  diltiazem (CARDIZEM CD) 240 MG 24 hr capsule Take 240 mg by mouth daily.   Yes [provider]  furosemide (LASIX) 40 MG tablet Take 1 tablet (40 mg total) by mouth every other day. 06/01/17  Yes Johnson,  Clanford L, MD  HYDROcodone-acetaminophen (NORCO/VICODIN) 5-325 MG tablet Take 1 tablet by mouth every 4 (four) hours as needed for moderate pain. 12/10/17  Yes Ulyses Amor, PA-C  hydroxyurea (HYDREA) 500 MG capsule Take 1 capsule (500 mg total) by mouth daily. May take with food to minimize GI side effects. 12/08/17  Yes Higgs, Mathis Dad, MD  levothyroxine (SYNTHROID, LEVOTHROID) 125 MCG tablet Take 125 mcg by mouth daily before breakfast.  11/03/17  Yes [provider]  pravastatin (PRAVACHOL) 20 MG tablet Take 10 mg by mouth daily. 08/08/17  Yes [provider]  torsemide (DEMADEX) 20 MG tablet Take 1 tablet (20 mg total) by mouth daily as needed. May take for Weight gain of 3 lbs over 24 hours 09/22/17 12/21/17 Yes Herminio Commons, MD  warfarin (COUMADIN) 5 MG tablet Take 1/2 tablet daily except 1 tablet on Fridays or as directed Patient taking differently: Take 2.5-5 mg by mouth See admin instructions. 5 mg on Monday, all other days 2.5 mg 10/14/17  Yes Herminio Commons, MD    Family History Family History  Problem Relation Age of Onset  . Heart failure Mother   . Heart disease Mother   . Stroke Father   . Heart disease Sister   . Diabetes Son   . Hypertension Sister   . Hypertension Sister   . Stroke Sister     Social History Social History   Tobacco Use  . Smoking status: Never Smoker  . Smokeless tobacco: Never Used  Substance Use Topics  . Alcohol use: No    Alcohol/week: 0.0 oz  . Drug use: No     Allergies   Patient has no known allergies.   Review of Systems Review of Systems  Constitutional: Negative for activity change.  Respiratory: Negative for shortness of breath.   Cardiovascular: Negative for chest pain.  Gastrointestinal: Negative for abdominal pain.  Genitourinary: Negative for dysuria.  Musculoskeletal: Positive for arthralgias (Right foot) and myalgias (Right groin). Negative for back pain.  Skin: Positive for color change and  wound. Negative for rash.  Allergic/Immunologic: Negative for immunocompromised state.  Neurological: Negative for weakness, numbness and headaches.  Psychiatric/Behavioral: Negative for confusion.     Physical Exam Updated Vital Signs BP 132/79   Pulse 72   Temp (!) 97 F (36.1 C) (Oral)   Resp 15   LMP  (LMP Unknown)   SpO2 100%   Physical Exam  Constitutional: No distress.  HENT:  Head: Normocephalic.  Eyes: Conjunctivae are normal.  Neck: Neck supple.  Cardiovascular: Normal rate and regular rhythm. Exam reveals no gallop and no friction  rub.  No murmur heard. Pulmonary/Chest: Effort normal. No respiratory distress.  Abdominal: Soft. She exhibits no distension.  Genitourinary:  Genitourinary Comments: Wound to the right groin that is open.  No purulence is noted.  Musculoskeletal:  Right fifth digit has been amputated.  There is warmth and erythema to the right foot.  Neurological: She is alert.  Skin: Skin is warm. No rash noted.  Psychiatric: Her behavior is normal.  Nursing note and vitals reviewed.    ED Treatments / Results  Labs (all labs ordered are listed, but only abnormal results are displayed) Labs Reviewed - No data to display  EKG None  Radiology No results found.  Procedures Procedures (including critical care time)  Medications Ordered in ED Medications  vancomycin (VANCOCIN) IVPB 1000 mg/200 mL premix (1,000 mg Intravenous New Bag/Given 12/11/17 2128)  vancomycin (VANCOCIN) 500 mg in sodium chloride 0.9 % 100 mL IVPB (has no administration in time range)  acetaminophen (TYLENOL) tablet 1,000 mg (1,000 mg Oral Given 12/11/17 2137)     Initial Impression / Assessment and Plan / ED Course  I have reviewed the triage vital signs and the nursing notes.  Pertinent labs & imaging results that were available during my care of the patient were reviewed by me and considered in my medical decision making (see chart for details).      77 year old female with a history of PVD, A. fib on Coumadin, CHF, COPD on 4 L of home O2, DVT, HTN, osteomyelitis and dry gangrene of the right foot who presents to the emergency department with a chief complaint of right groin pain.  The patient spoke with her vascular surgeon, Dr. [name], earlier today who is advised her to return to the emergency department for admission after having dysfunction with her wound VAC at home that was placed yesterday intraoperatively.  The patient was discussed with Dr. Laverta Baltimore. Dr. [name] will admit.  Vancomycin dose per pharmacy in the ED.  Tylenol given for pain control. The patient appears reasonably stabilized for admission considering the current resources, flow, and capabilities available in the ED at this time, and I doubt any other [facility] requiring further screening and/or treatment in the ED prior to admission.   Final Clinical Impressions(s) / ED Diagnoses   Final diagnoses:  Wound of right groin, subsequent encounter    ED Discharge Orders    None       Joanne Gavel, PA-C 12/11/17 2227    Margette Fast, MD 12/11/17 2333

## 2017-12-12 ENCOUNTER — Encounter (HOSPITAL_COMMUNITY): Payer: Self-pay | Admitting: *Deleted

## 2017-12-12 ENCOUNTER — Other Ambulatory Visit: Payer: Self-pay

## 2017-12-12 LAB — BASIC METABOLIC PANEL
ANION GAP: 8 (ref 5–15)
BUN: 19 mg/dL (ref 6–20)
CALCIUM: 8.7 mg/dL — AB (ref 8.9–10.3)
CO2: 22 mmol/L (ref 22–32)
Chloride: 107 mmol/L (ref 101–111)
Creatinine, Ser: 1.1 mg/dL — ABNORMAL HIGH (ref 0.44–1.00)
GFR calc Af Amer: 55 mL/min — ABNORMAL LOW (ref >60)
GFR, EST NON AFRICAN AMERICAN: 48 mL/min — AB (ref >60)
Glucose, Bld: 89 mg/dL (ref 65–99)
POTASSIUM: 3.8 mmol/L (ref 3.5–5.1)
SODIUM: 137 mmol/L (ref 135–145)

## 2017-12-12 LAB — CBC
HEMATOCRIT: 25.7 % — AB (ref 36.0–46.0)
HEMOGLOBIN: 7.3 g/dL — AB (ref 12.0–15.0)
MCH: 23.9 pg — ABNORMAL LOW (ref 26.0–34.0)
MCHC: 28.4 g/dL — ABNORMAL LOW (ref 30.0–36.0)
MCV: 84 fL (ref 78.0–100.0)
Platelets: 774 10*3/uL — ABNORMAL HIGH (ref 150–400)
RBC: 3.06 MIL/uL — AB (ref 3.87–5.11)
RDW: 22.2 % — ABNORMAL HIGH (ref 11.5–15.5)
WBC: 17.9 10*3/uL — ABNORMAL HIGH (ref 4.0–10.5)

## 2017-12-12 LAB — PROTIME-INR
INR: 2.47
Prothrombin Time: 26.6 seconds — ABNORMAL HIGH (ref 11.4–15.2)

## 2017-12-12 MED ORDER — ORAL CARE MOUTH RINSE
15.0000 mL | Freq: Two times a day (BID) | OROMUCOSAL | Status: DC
  Administered 2017-12-12 – 2017-12-15 (×4): 15 mL via OROMUCOSAL

## 2017-12-12 NOTE — Progress Notes (Signed)
  Progress Note    12/12/2017 9:33 AM * No surgery found *  Subjective: Pain in right groin otherwise feeling fine  Vitals:   12/11/17 2243 12/12/17 0217  BP: 140/73 120/78  Pulse: (!) 109 87  Resp: 18 18  Temp: 97.7 F (36.5 C) 97.8 F (36.6 C)  SpO2: 100% 100%    Physical Exam: Awake alert and oriented No acute distress Right groin with combination of bloody and serous drainage palpable pulse at the base Palpable graft in the mid thigh with pulsatility with incisions well-healed Right foot wound with dressing applied and has granulation tissue with minimal drainage  CBC    Component Value Date/Time   WBC 17.9 (H) 12/12/2017 0447   RBC 3.06 (L) 12/12/2017 0447   HGB 7.3 (L) 12/12/2017 0447   HCT 25.7 (L) 12/12/2017 0447   PLT 774 (H) 12/12/2017 0447   MCV 84.0 12/12/2017 0447   MCH 23.9 (L) 12/12/2017 0447   MCHC 28.4 (L) 12/12/2017 0447   RDW 22.2 (H) 12/12/2017 0447   LYMPHSABS 0.7 11/11/2017 0842   MONOABS 0.2 11/11/2017 0842   EOSABS 0.1 11/11/2017 0842   BASOSABS 0.1 11/11/2017 0842    BMET    Component Value Date/Time   NA 137 12/12/2017 0447   K 3.8 12/12/2017 0447   CL 107 12/12/2017 0447   CO2 22 12/12/2017 0447   GLUCOSE 89 12/12/2017 0447   BUN 19 12/12/2017 0447   CREATININE 1.10 (H) 12/12/2017 0447   CREATININE 1.21 (H) 08/14/2016 0733   CALCIUM 8.7 (L) 12/12/2017 0447   GFRNONAA 48 (L) 12/12/2017 0447   GFRAA 55 (L) 12/12/2017 0447    INR    Component Value Date/Time   INR 2.47 12/12/2017 0447     Intake/Output Summary (Last 24 hours) at 12/12/2017 0933 Last data filed at 12/12/2017 3729 Gross per 24 hour  Intake 0 ml  Output -  Net 0 ml     Assessment:  77 y.o. female is status post opening of sinus tract and right groin where there is evident graft at the base the wound.  It is generally well incorporated but a wound VAC would not hold seal.  We are now doing wet-to-dry dressings.  Plan: Continue twice daily  wet-to-dry White count 17 no fevers previous MRSA positive cultures not returned from recent operation.  Continue vancomycin Continue Coumadin at home dose.   Britney Captain C. [name], MD [facility] [place] Office: [phone] Pager: [phone]  12/12/2017 9:33 AM

## 2017-12-13 LAB — BASIC METABOLIC PANEL
Anion gap: 11 (ref 5–15)
BUN: 20 mg/dL (ref 6–20)
CALCIUM: 8.7 mg/dL — AB (ref 8.9–10.3)
CO2: 22 mmol/L (ref 22–32)
CREATININE: 1.24 mg/dL — AB (ref 0.44–1.00)
Chloride: 105 mmol/L (ref 101–111)
GFR calc non Af Amer: 41 mL/min — ABNORMAL LOW (ref >60)
GFR, EST AFRICAN AMERICAN: 48 mL/min — AB (ref >60)
Glucose, Bld: 77 mg/dL (ref 65–99)
Potassium: 3.6 mmol/L (ref 3.5–5.1)
SODIUM: 138 mmol/L (ref 135–145)

## 2017-12-13 LAB — CBC WITH DIFFERENTIAL/PLATELET
BASOS PCT: 1 %
Basophils Absolute: 0.2 10*3/uL — ABNORMAL HIGH (ref 0.0–0.1)
Eosinophils Absolute: 0.2 10*3/uL (ref 0.0–0.7)
Eosinophils Relative: 1 %
HEMATOCRIT: 28.9 % — AB (ref 36.0–46.0)
Hemoglobin: 8.3 g/dL — ABNORMAL LOW (ref 12.0–15.0)
LYMPHS ABS: 1.9 10*3/uL (ref 0.7–4.0)
Lymphocytes Relative: 10 %
MCH: 25 pg — AB (ref 26.0–34.0)
MCHC: 28.7 g/dL — AB (ref 30.0–36.0)
MCV: 87 fL (ref 78.0–100.0)
MONO ABS: 0.6 10*3/uL (ref 0.1–1.0)
Monocytes Relative: 3 %
NEUTROS PCT: 85 %
Neutro Abs: 16 10*3/uL — ABNORMAL HIGH (ref 1.7–7.7)
PLATELETS: 680 10*3/uL — AB (ref 150–400)
RBC: 3.32 MIL/uL — ABNORMAL LOW (ref 3.87–5.11)
RDW: 22.9 % — AB (ref 11.5–15.5)
WBC: 18.9 10*3/uL — ABNORMAL HIGH (ref 4.0–10.5)

## 2017-12-13 LAB — PROTIME-INR
INR: 2.29
PROTHROMBIN TIME: 25 s — AB (ref 11.4–15.2)

## 2017-12-13 MED ORDER — PIPERACILLIN-TAZOBACTAM 3.375 G IVPB
3.3750 g | Freq: Three times a day (TID) | INTRAVENOUS | Status: DC
  Administered 2017-12-13 – 2017-12-14 (×4): 3.375 g via INTRAVENOUS
  Filled 2017-12-13 (×6): qty 50

## 2017-12-13 NOTE — Progress Notes (Addendum)
  Progress Note    12/13/2017 10:24 AM  Subjective:  No new complaints this morning   Vitals:   12/13/17 0803 12/13/17 0835  BP: 122/62 (!) 117/58  Pulse: 92 86  Resp:  16  Temp:  97.7 F (36.5 C)  SpO2:  99%   Physical Exam: Lungs:  Non labored on 4L Incisions:  R groin incision with small portion of PTFE graft exposed, no frank pus; healthy wound bed otherwise; R 5th toe amp site also healthy appearing wound bed without obvious infection Extremities:  Palpable bypass pulsation in thigh Abdomen:  Soft Neurologic: A&O  CBC    Component Value Date/Time   WBC 18.9 (H) 12/13/2017 0346   RBC 3.32 (L) 12/13/2017 0346   HGB 8.3 (L) 12/13/2017 0346   HCT 28.9 (L) 12/13/2017 0346   PLT 680 (H) 12/13/2017 0346   MCV 87.0 12/13/2017 0346   MCH 25.0 (L) 12/13/2017 0346   MCHC 28.7 (L) 12/13/2017 0346   RDW 22.9 (H) 12/13/2017 0346   LYMPHSABS 1.9 12/13/2017 0346   MONOABS 0.6 12/13/2017 0346   EOSABS 0.2 12/13/2017 0346   BASOSABS 0.2 (H) 12/13/2017 0346    BMET    Component Value Date/Time   NA 138 12/13/2017 0346   K 3.6 12/13/2017 0346   CL 105 12/13/2017 0346   CO2 22 12/13/2017 0346   GLUCOSE 77 12/13/2017 0346   BUN 20 12/13/2017 0346   CREATININE 1.24 (H) 12/13/2017 0346   CREATININE 1.21 (H) 08/14/2016 0733   CALCIUM 8.7 (L) 12/13/2017 0346   GFRNONAA 41 (L) 12/13/2017 0346   GFRAA 48 (L) 12/13/2017 0346    INR    Component Value Date/Time   INR 2.29 12/13/2017 0346     Intake/Output Summary (Last 24 hours) at 12/13/2017 1024 Last data filed at 12/12/2017 1423 Gross per 24 hour  Intake 240 ml  Output 900 ml  Net -660 ml     Assessment/Plan:  77 y.o. female is s/p R groin debridement  Patent bypass with palpable graft pulsation in thigh Wet to dry dressing changed this morning; no further bleeding Small portion of graft exposed in groin but still appears to be incorporated with no frank pus Continue IV vancomycin/zosyn No estimated d/c date  at this time   Dagoberto Ligas, PA-C [facility] [phone] 12/13/2017 10:24 AM   I have independently interviewed and examined patient and agree with PA assessment and plan above.   Daltin Crist C. [name], MD [facility] Office: [phone] Pager: [phone]

## 2017-12-14 LAB — VANCOMYCIN, TROUGH: Vancomycin Tr: 13 ug/mL — ABNORMAL LOW (ref 15–20)

## 2017-12-14 LAB — PROTIME-INR
INR: 2.39
Prothrombin Time: 25.9 s — ABNORMAL HIGH (ref 11.4–15.2)

## 2017-12-14 MED ORDER — AMOXICILLIN-POT CLAVULANATE 875-125 MG PO TABS
1.0000 | ORAL_TABLET | Freq: Two times a day (BID) | ORAL | Status: DC
  Administered 2017-12-14 – 2017-12-15 (×2): 1 via ORAL
  Filled 2017-12-14 (×2): qty 1

## 2017-12-14 NOTE — Care Management Note (Signed)
Case Management Note  Patient Details  Name: Charlotte [name] MRN: [id number] Date of Birth: Dec 31, 1940  Subjective/Objective:    Pt admitted after wound vac malfuntion             Action/Plan:   PTA independent from home with husband - per pt she has a walker in the home, pt declined needing additional equipment.  Per Vascular pt will not discharge home with wound vac - pt will need HHRN to assist with wound dressing changes - pt is active with [facility].  CM requested resumption orders   Expected Discharge Date:                  Expected Discharge Plan:  [place]  In-House Referral:     Discharge planning Services  CM Consult  Post Acute Care Choice:    Choice offered to:     DME Arranged:    DME Agency:     HH Arranged:  RN [place] Agency:  [facility]  Status of Service:  In process, will continue to follow  If discussed at Long Length of Stay Meetings, dates discussed:    Additional Comments:  [name], RN 12/14/2017, 4:22 PM

## 2017-12-14 NOTE — Progress Notes (Signed)
Pharmacy Antibiotic Note  Charlotte [name] is a 77 y.o. female admitted on 12/11/2017 with post-op infection. Vancomycin trough drawn prior to AM dose on 12/14/17 (steady state). Level was 13 (goal 10-15 mcg/ml for wound infection).   Plan: Continue Vancomycin 500 q12 Monitor renal fx, Lot, vt prn   Height: 6' (182.9 cm) Weight: 149 lb 11.1 oz (67.9 kg) IBW/kg (Calculated) : 73.1  Temp (24hrs), Avg:98 F (36.7 C), Min:97.7 F (36.5 C), Max:98.2 F (36.8 C)  Recent Labs  Lab 12/10/17 0727 12/10/17 0817 12/12/17 0447 12/13/17 0346 12/14/17 0955  WBC 16.3* 14.6* 17.9* 18.9*  --   CREATININE 0.98  --  1.10* 1.24*  --   VANCOTROUGH  --   --   --   --  13*    Estimated Creatinine Clearance: 42 mL/min (A) (by C-G formula based on SCr of 1.24 mg/dL (H)).    No Known Allergies  Charlotte [name] A. [name], PharmD, [place] Pager: [phone]  12/14/2017 12:12 PM

## 2017-12-14 NOTE — Consult Note (Signed)
[facility] for Infectious Disease    Date of Admission:  12/11/2017     Total days of antibiotics 5   Vancomycin   Zosyn 2       Reason for Consult: Femoral graft infection     Referring Provider: Dr. [name] Primary Care Provider: [name], MD   Assessment: 77 y.o. female now POD # 4 following a debridement of her right groin. Communicating sinus graft with the underlying Dacron graft which was retained. She has had no fevers post-op and tolerating antibiotics presently. Will stop zosyn and vancomycin and cover with Augmentin BID x 3 weeks. With slight odor to foot and groin involvement wound would like to keep some anaerobic coverage on board.  She has what appears to be exposed bone on the right #4 toe. Amputation needed?   Plan: 1. Stop Vancomycin 2. Stop Zosyn  3. Start Augmentin BID x 3 weeks 4. Wound care per surgery team.   Active Problems:   Right groin wound   . aspirin EC  81 mg Oral Daily  . diltiazem  240 mg Oral Daily  . furosemide  40 mg Oral QODAY  . levothyroxine  125 mcg Oral QAC breakfast  . mouth rinse  15 mL Mouth Rinse BID  . pantoprazole  40 mg Oral Daily  . potassium chloride  20-40 mEq Oral Once  . pravastatin  10 mg Oral Daily  . sodium chloride flush  3 mL Intravenous Q12H  . warfarin  2.5 mg Oral Once per day on Sun Tue Wed Thu Fri Sat  . warfarin  5 mg Oral Q Mon-1800  . Warfarin - Physician Dosing Inpatient   Does not apply q1800    HPI: Charlotte [name] is a 77 y.o. female with a past medical history significant for right femoral endarterectomy with Dacron patch angioplasty and profundoplasty and right femoral popliteal bypass graft  (2014). She developed a draining sinus tract to the right groin. 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   Last antibiotic 09/2017 - Bactrim DS BID x 10d. Seen in Vascular Clinic 12/07/17 where it was determined that she would require surgery. She was given Keflex 500 mg TID  prior to surgery. She has been started on vancomycin + zosyn following her procedure   Culture from right groin Group B strep.   Op Note: draining sinus tract down to the level of the graft. Graft itself has good integrity; vein bypass was not identified but palpable and w/o pus. Wound vac was placed.   Review of Systems: Review of Systems  Constitutional: Negative for chills and fever.  HENT: Negative for tinnitus.   Eyes: Negative for blurred vision and photophobia.  Respiratory: Negative for cough and sputum production.   Cardiovascular: Negative for chest pain.  Gastrointestinal: Negative for diarrhea, nausea and vomiting.  Genitourinary: Negative for dysuria.  Skin: Negative for rash.  Neurological: Negative for headaches.    Past Medical History:  Diagnosis Date  . Anxiety   . Arthritis   . Atrial fibrillation, chronic (HCC)    a. on Coumadin for anticoagulation.   . Cellulitis 06/29/2017   RIGHT LOWER LEG  . CHF (congestive heart failure) ([place])    combined  . COPD (chronic obstructive pulmonary disease) ([place])    on home O2 4 lpm  . Deaf   . 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 DVT (deep venous thrombosis) ([place])   .  Essential hypertension   . GERD (gastroesophageal reflux disease)   . HOH (hard of hearing)   . Hyperlipidemia   . Hypothyroidism   . Open wound    right groin  . Oxygen dependent    4 lpm  . Peripheral neuropathy   . Peripheral vascular disease ([place])   . 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 Ulcer of ankle ([place])    Bilateral 2015  . Varicose veins     Social History   Tobacco Use  . Smoking status: Never Smoker  . Smokeless tobacco: Never Used  Substance Use Topics  . Alcohol use: No    Alcohol/week: 0.0 oz  . Drug use: No    Family History  Problem Relation Age of Onset  . Heart failure Mother   . Heart disease Mother   . Stroke Father   . Heart disease Sister   . Diabetes Son   . Hypertension Sister   . Hypertension Sister   .  Stroke Sister    No Known Allergies  OBJECTIVE: Blood pressure (!) 114/56, pulse 80, temperature 97.7 F (36.5 C), temperature source Oral, resp. rate 18, height 6' (1.829 m), weight 149 lb 11.1 oz (67.9 kg), SpO2 99 %.  Physical Exam  Constitutional: She is oriented to person, place, and time. She appears well-developed and well-nourished.  Resting comfortably in bed.   HENT:  Mouth/Throat: Mucous membranes are normal. No oral lesions. Normal dentition. No dental abscesses. No oropharyngeal exudate.  Cardiovascular: Normal rate and normal heart sounds. An irregularly irregular rhythm present.  Pulmonary/Chest: Effort normal and breath sounds normal.  Abdominal: Soft. She exhibits no distension. There is no tenderness.  Musculoskeletal:       Legs:      Feet:  Lymphadenopathy:    She has no cervical adenopathy.  Neurological: She is alert and oriented to person, place, and time.  Skin: Skin is warm and dry. No rash noted.  Psychiatric: She has a normal mood and affect. Judgment normal.  In good spirits today and engaged in care discussion    Lab Results Lab Results  Component Value Date   WBC 18.9 (H) 12/13/2017   HGB 8.3 (L) 12/13/2017   HCT 28.9 (L) 12/13/2017   MCV 87.0 12/13/2017   PLT 680 (H) 12/13/2017    Lab Results  Component Value Date   CREATININE 1.24 (H) 12/13/2017   BUN 20 12/13/2017   NA 138 12/13/2017   K 3.6 12/13/2017   CL 105 12/13/2017   CO2 22 12/13/2017    Lab Results  Component Value Date   ALT <5 (L) 11/11/2017   AST 14 (L) 11/11/2017   ALKPHOS 83 11/11/2017   BILITOT 0.9 11/11/2017     Microbiology: Right Groin 12/10/17 >> Group B Strep    Janene Madeira, MSN, NP-C [facility] for Infectious [place] Cell: [phone] Pager: [phone]  12/14/2017 2:10 PM

## 2017-12-14 NOTE — Progress Notes (Addendum)
[facility]  Subjective  - Doing OK over all.  No new complaints.   Objective 130/70 91 97.7 F (36.5 C) (Oral) 17 98%  Intake/Output Summary (Last 24 hours) at 12/14/2017 0744 Last data filed at 12/14/2017 0300 Gross per 24 hour  Intake 500 ml  Output -  Net 500 ml    Clean dressing over groin site and lateral right foot just changed per nursing wet to dry Rigth thigh are graft with palpable pulse.  Right foot warm to touch with active range of motion intact. Heart A fib  Lungs non labored breathing Gen NAD  Assessment/Planning: POD # 3 right groin debridement and 5th toe amputation Small portion of graft exposed in groin wound bed, well incorporated.   S/P multiple attempts at wound vac usage, now wet to dry to right groin and foot. No recurrent bleeding  Coumadin restarted for A fib IV Vanc and Zosyn WBC elevated 18.9 Plan per Dr. [name] reapply wound vac verses continued wet to dry dressing changes.     Roxy Horseman 12/14/2017 7:44 AM --  Laboratory Lab Results: Recent Labs    12/12/17 0447 12/13/17 0346  WBC 17.9* 18.9*  HGB 7.3* 8.3*  HCT 25.7* 28.9*  PLT 774* 680*   BMET Recent Labs    12/12/17 0447 12/13/17 0346  NA 137 138  K 3.8 3.6  CL 107 105  CO2 22 22  GLUCOSE 89 77  BUN 19 20  CREATININE 1.10* 1.24*  CALCIUM 8.7* 8.7*    COAG Lab Results  Component Value Date   INR 2.39 12/14/2017   INR 2.29 12/13/2017   INR 2.47 12/12/2017   No results found for: PTT  I have independently interviewed and examined patient and agree with PA assessment and plan above.  The wound appears to be healing well with wet-to-dry dressings and we will use this for home instead of a wound VAC.  We will ask infectious disease to get involved for home antibiotics.  Ido Wollman C. [name], MD [facility] Office: [phone] Pager: [phone]

## 2017-12-15 ENCOUNTER — Other Ambulatory Visit: Payer: Self-pay | Admitting: *Deleted

## 2017-12-15 DIAGNOSIS — A491 Streptococcal infection, unspecified site: Secondary | ICD-10-CM

## 2017-12-15 DIAGNOSIS — T8189XA Other complications of procedures, not elsewhere classified, initial encounter: Secondary | ICD-10-CM

## 2017-12-15 DIAGNOSIS — B951 Streptococcus, group B, as the cause of diseases classified elsewhere: Secondary | ICD-10-CM

## 2017-12-15 DIAGNOSIS — S91301A Unspecified open wound, right foot, initial encounter: Secondary | ICD-10-CM

## 2017-12-15 DIAGNOSIS — S31103A Unspecified open wound of abdominal wall, right lower quadrant without penetration into peritoneal cavity, initial encounter: Secondary | ICD-10-CM

## 2017-12-15 DIAGNOSIS — X58XXXA Exposure to other specified factors, initial encounter: Secondary | ICD-10-CM

## 2017-12-15 DIAGNOSIS — I499 Cardiac arrhythmia, unspecified: Secondary | ICD-10-CM

## 2017-12-15 LAB — PROTIME-INR
INR: 2.48
Prothrombin Time: 26.6 seconds — ABNORMAL HIGH (ref 11.4–15.2)

## 2017-12-15 LAB — AEROBIC/ANAEROBIC CULTURE (SURGICAL/DEEP WOUND)

## 2017-12-15 LAB — AEROBIC/ANAEROBIC CULTURE W GRAM STAIN (SURGICAL/DEEP WOUND)

## 2017-12-15 MED ORDER — AMOXICILLIN-POT CLAVULANATE 875-125 MG PO TABS: 1.0000 | ORAL_TABLET | Freq: Two times a day (BID) | ORAL | 0 refills | Status: AC

## 2017-12-15 MED ORDER — HYDROCODONE-ACETAMINOPHEN 5-325 MG PO TABS: 1.0000 | ORAL_TABLET | Freq: Four times a day (QID) | ORAL | 0 refills | Status: DC | PRN

## 2017-12-15 NOTE — Care Management Note (Signed)
Case Management Note  Patient Details  Name: Charlotte [name] MRN: [id number] Date of Birth: [birth year]  Subjective/Objective:    Pt admitted after wound vac malfuntion             Action/Plan:   PTA independent from home with husband - per pt she has a walker in the home, pt declined needing additional equipment.  Per Vascular pt will not discharge home with wound vac - pt will need HHRN to assist with wound dressing changes - pt is active with [facility].  CM requested resumption orders   Expected Discharge Date:  12/15/17               Expected Discharge Plan:  [place]  In-House Referral:     Discharge planning Services  CM Consult  Post Acute Care Choice:    Choice offered to:     DME Arranged:    DME Agency:     HH Arranged:  RN [place] Agency:  [facility]  Status of Service:  In process, will continue to follow  If discussed at Long Length of Stay Meetings, dates discussed:    Additional Comments: Pt to discharge home to day with [facility] resumption orders completed.  CM contacted [facility] to inform pt will discharge home today [name], RN 12/15/2017, 12:03 PM

## 2017-12-15 NOTE — Progress Notes (Signed)
Patient discharge teaching given to patient and husband, including activity, diet, follow-up appoints, and medications. Patient verbalized understanding of all discharge instructions. IV access was d/c'd. Vitals are stable. Skin is intact except as charted in most recent assessments. Pt to be escorted out by NT, to be driven home by husband.

## 2017-12-15 NOTE — Discharge Instructions (Signed)

## 2017-12-15 NOTE — Progress Notes (Signed)
[facility] for Infectious Disease  Date of Admission:  12/11/2017     Total days of antibiotics 5  Augmentin day 1           Patient ID: Charlotte [name] is a 77 y.o. female with  Principal Problem:   Group B streptococcal infection Active Problems:   Right groin wound   Open wound of right foot   . amoxicillin-clavulanate  1 tablet Oral Q12H  . aspirin EC  81 mg Oral Daily  . diltiazem  240 mg Oral Daily  . furosemide  40 mg Oral QODAY  . levothyroxine  125 mcg Oral QAC breakfast  . mouth rinse  15 mL Mouth Rinse BID  . pantoprazole  40 mg Oral Daily  . potassium chloride  20-40 mEq Oral Once  . pravastatin  10 mg Oral Daily  . sodium chloride flush  3 mL Intravenous Q12H  . warfarin  2.5 mg Oral Once per day on Sun Tue Wed Thu Fri Sat  . warfarin  5 mg Oral Q Mon-1800  . Warfarin - Physician Dosing Inpatient   Does not apply q1800    SUBJECTIVE: Feeling well. Tells me she is going home today with home health team to help with dressing changes. No fevers/chills. No other complaints. Tolerating antibiotics well.    No Known Allergies  OBJECTIVE: Vitals:   12/14/17 0449 12/14/17 1350 12/14/17 2032 12/15/17 0449  BP: 130/70 (!) 114/56 103/71 125/71  Pulse: 91 80 79 70  Resp: 17 18 18 18   Temp: 97.7 F (36.5 C) 97.7 F (36.5 C) 97.8 F (36.6 C) 97.9 F (36.6 C)  TempSrc: Oral Oral Oral   SpO2: 98% 99% 96% 100%  Weight:      Height:       Body mass index is 20.3 kg/m.  Physical Exam  Constitutional: She is oriented to person, place, and time. She appears well-developed and well-nourished. No distress.  Cardiovascular: Normal rate. An irregularly irregular rhythm present.  Pulmonary/Chest: Effort normal. No respiratory distress.  Abdominal: Soft.  Musculoskeletal: She exhibits no edema.  Right groing with serous drainage. Clean. Pale, deep wound with granulation tissue present.   Neurological: She is alert and oriented to person, place, and  time.  Skin: Skin is warm and dry.  Right toe bandage intact. Did not assess today   Lab Results Lab Results  Component Value Date   WBC 18.9 (H) 12/13/2017   HGB 8.3 (L) 12/13/2017   HCT 28.9 (L) 12/13/2017   MCV 87.0 12/13/2017   PLT 680 (H) 12/13/2017    Lab Results  Component Value Date   CREATININE 1.24 (H) 12/13/2017   BUN 20 12/13/2017   NA 138 12/13/2017   K 3.6 12/13/2017   CL 105 12/13/2017   CO2 22 12/13/2017    Lab Results  Component Value Date   ALT <5 (L) 11/11/2017   AST 14 (L) 11/11/2017   ALKPHOS 83 11/11/2017   BILITOT 0.9 11/11/2017     Microbiology: Recent Results (from the past 240 hour(s))  Fungus Culture With Stain     Status: None (Preliminary result)   Collection Time: 12/10/17 10:38 AM  Result Value Ref Range Status   Fungus Stain Final report  Final    Comment: (NOTE) Performed At: [facility] [place], [place] [id number] Rush Farmer MD FA:[id number]    Fungus (Mycology) Culture PENDING  Incomplete   Fungal Source WOUND  Final    Comment: RIGHT GROIN Performed at [facility], [place] [address] [place], [place] [address]   Aerobic/Anaerobic Culture (surgical/deep wound)     Status: None (Preliminary result)   Collection Time: 12/10/17 10:38 AM  Result Value Ref Range Status   Specimen Description WOUND RIGHT GROIN  Final   Special Requests NONE  Final   Gram Stain   Final    MODERATE WBC PRESENT, PREDOMINANTLY PMN NO ORGANISMS SEEN Performed at [facility], [place] [address], [place], [place] [address]    Culture   Final    FEW GROUP B STREP(S.AGALACTIAE)ISOLATED TESTING AGAINST S. AGALACTIAE NOT ROUTINELY PERFORMED DUE TO PREDICTABILITY OF AMP/PEN/VAN SUSCEPTIBILITY. NO ANAEROBES ISOLATED; CULTURE IN PROGRESS FOR 5 DAYS    Report Status PENDING  Incomplete  Fungus Culture Result     Status: None   Collection Time: 12/10/17 10:38 AM  Result Value Ref Range Status   Result 1 Comment  Final     Comment: (NOTE) KOH/Calcofluor preparation:  no fungus observed. Performed At: [facility] [place], [place] [id number] Rush Farmer MD YT:[id number] Performed at [facility], [place] [address] [place], [place] [address]    ASSESSMENT: 77 y.o. female with group b streptococcal infection involving her right groin. Also with a slow-healing surgical wound to the right lateral foot. She is tolerating her Augmentin and will continue with this BID at home x 3 more weeks. She will have close follow up with vascular team for wound.  We are happy to see her in ID clinic outpatient. Would consider outpatient wound care clinic for assistance in management of foot wound.   PLAN: 1. Augmentin 875 mg BID x 3 weeks after discharge home.   Janene Madeira, MSN, NP-C [facility] for Infectious [place] Cell: [phone] Pager: [phone]  12/15/2017  11:40 AM

## 2017-12-15 NOTE — Progress Notes (Signed)
  Progress Note    12/15/2017 11:14 AM * No surgery found *  Subjective: No acute complaints  Vitals:   12/14/17 2032 12/15/17 0449  BP: 103/71 125/71  Pulse: 79 70  Resp: 18 18  Temp: 97.8 F (36.6 C) 97.9 F (36.6 C)  SpO2: 96% 100%    Physical Exam: Patient awake alert and oriented Abdomen is soft and nontender Right groin is healing well, there is still some evidence of graft deep in the wound but there is granulation tissue Right foot is without drainage and appears to be granulating  CBC    Component Value Date/Time   WBC 18.9 (H) 12/13/2017 0346   RBC 3.32 (L) 12/13/2017 0346   HGB 8.3 (L) 12/13/2017 0346   HCT 28.9 (L) 12/13/2017 0346   PLT 680 (H) 12/13/2017 0346   MCV 87.0 12/13/2017 0346   MCH 25.0 (L) 12/13/2017 0346   MCHC 28.7 (L) 12/13/2017 0346   RDW 22.9 (H) 12/13/2017 0346   LYMPHSABS 1.9 12/13/2017 0346   MONOABS 0.6 12/13/2017 0346   EOSABS 0.2 12/13/2017 0346   BASOSABS 0.2 (H) 12/13/2017 0346    BMET    Component Value Date/Time   NA 138 12/13/2017 0346   K 3.6 12/13/2017 0346   CL 105 12/13/2017 0346   CO2 22 12/13/2017 0346   GLUCOSE 77 12/13/2017 0346   BUN 20 12/13/2017 0346   CREATININE 1.24 (H) 12/13/2017 0346   CREATININE 1.21 (H) 08/14/2016 0733   CALCIUM 8.7 (L) 12/13/2017 0346   GFRNONAA 41 (L) 12/13/2017 0346   GFRAA 48 (L) 12/13/2017 0346    INR    Component Value Date/Time   INR 2.48 12/15/2017 0435     Intake/Output Summary (Last 24 hours) at 12/15/2017 1114 Last data filed at 12/15/2017 1005 Gross per 24 hour  Intake 544 ml  Output 600 ml  Net -56 ml     Assessment:  77 y.o. female is s/p debridement of the sinus tract in the right groin where there is graft deep in the wound.  She is now on antibiotics at the discretion of ID.  Plan: Discharge today with home health wound care to both her groin and foot.  Nakeeta Sebastiani C. [name], MD [facility] [place] Office: [phone] Pager:  [phone]  12/15/2017 11:14 AM

## 2017-12-15 NOTE — Patient Outreach (Signed)
[facility] ([facility]) Care Management  12/15/2017  [place] 02/08/1941 [id number]   Care coordination case closure  [facility] CM spoke with Charlotte [name] on 09/17/17 after no response to follow up with Charlotte [name] She has done well after her outpatient surgeries for cataracts and leg  Cm was contacted by [facility] Hospital liaison on 12/14/17 about this patient' Charlotte [name] was updated that Charlotte [name] denied any further [facility] Community CM needs in February 2019 and patient was being followed by [facility] integrated paramedic program for a period of time  Plans Case closure Letters for case closure to be sent to patient and MD  [facility] CM Care Plan Problem One     Most Recent Value  Care Plan Problem One  Referrals to [facility] SW and pharmacy services  Role Documenting the Problem One  Care Management Coordinator  Care Plan for Problem One  Active  [facility] Long Term Goal   over the next 45 days Patient will receive assistance from [facility] SW and pharmacy services  [facility] Long Term Goal Start Date  07/07/17  [facility] Long Term Goal Met Date  09/17/17  [facility] CM Short Term Goal #1   over the next 30 days Patient will receive assistance from [facility] SW and pharmacy  [facility] CM Short Term Goal #1 Start Date  07/07/17  [facility] CM Short Term Goal #1 Met Date  09/03/17    [facility] CM Care Plan Problem Two     Most Recent Value  Care Plan Problem Two  Knowledge of major medical diagnosis (AFIB, CHF, HTN, cellulitis)  Role Documenting the Problem Two  Care Management Coordinator  Care Plan for Problem Two  Active  [facility] Long Term Goal  over the next 60 days the patient and family will be able to verbalize knowledge of home care of major medical diagnosis (AFIB, CHF, HTN, cellulitis), hypotension) and levels of care providers   [facility] Long Term Goal Start Date  07/07/17  [facility] Long Term Goal Met Date  09/17/17  [facility] CM Short Term Goal #1   over the next 30 days patient and family will be able to identify which level of care provider for home symptonm   [facility] CM Short Term Goal #1 Start Date  07/07/17  [facility] CM Short Term Goal #1 Met Date   09/03/17       Kimberly L. Gibbs, RN, BSN, CCM [facility] Care Management Care Coordinator [phone] week day mobile  

## 2017-12-16 ENCOUNTER — Ambulatory Visit (INDEPENDENT_AMBULATORY_CARE_PROVIDER_SITE_OTHER): Payer: Medicare Other | Admitting: *Deleted

## 2017-12-16 DIAGNOSIS — Z5181 Encounter for therapeutic drug level monitoring: Secondary | ICD-10-CM | POA: Diagnosis not present

## 2017-12-16 DIAGNOSIS — I2510 Atherosclerotic heart disease of native coronary artery without angina pectoris: Secondary | ICD-10-CM | POA: Diagnosis not present

## 2017-12-16 DIAGNOSIS — I5032 Chronic diastolic (congestive) heart failure: Secondary | ICD-10-CM | POA: Diagnosis not present

## 2017-12-16 DIAGNOSIS — B9562 Methicillin resistant Staphylococcus aureus infection as the cause of diseases classified elsewhere: Secondary | ICD-10-CM | POA: Diagnosis not present

## 2017-12-16 DIAGNOSIS — I482 Chronic atrial fibrillation, unspecified: Secondary | ICD-10-CM

## 2017-12-16 DIAGNOSIS — I110 Hypertensive heart disease with heart failure: Secondary | ICD-10-CM | POA: Diagnosis not present

## 2017-12-16 DIAGNOSIS — T8743 Infection of amputation stump, right lower extremity: Secondary | ICD-10-CM | POA: Diagnosis not present

## 2017-12-16 LAB — POCT INR: INR: 2.6

## 2017-12-16 NOTE — Patient Instructions (Signed)
Continue coumadin 2.5mg  daily except 5mg  on Mondays Recheck in 1 week Order given to [employer]

## 2017-12-18 ENCOUNTER — Ambulatory Visit: Payer: Medicare Other | Admitting: Vascular Surgery

## 2017-12-18 ENCOUNTER — Other Ambulatory Visit (HOSPITAL_COMMUNITY): Payer: Medicare Other

## 2017-12-18 DIAGNOSIS — I5032 Chronic diastolic (congestive) heart failure: Secondary | ICD-10-CM | POA: Diagnosis not present

## 2017-12-18 DIAGNOSIS — I482 Chronic atrial fibrillation: Secondary | ICD-10-CM | POA: Diagnosis not present

## 2017-12-18 DIAGNOSIS — I2510 Atherosclerotic heart disease of native coronary artery without angina pectoris: Secondary | ICD-10-CM | POA: Diagnosis not present

## 2017-12-18 DIAGNOSIS — T8743 Infection of amputation stump, right lower extremity: Secondary | ICD-10-CM | POA: Diagnosis not present

## 2017-12-18 DIAGNOSIS — B9562 Methicillin resistant Staphylococcus aureus infection as the cause of diseases classified elsewhere: Secondary | ICD-10-CM | POA: Diagnosis not present

## 2017-12-18 DIAGNOSIS — I110 Hypertensive heart disease with heart failure: Secondary | ICD-10-CM | POA: Diagnosis not present

## 2017-12-21 NOTE — Discharge Summary (Signed)
Discharge Summary    Charlotte [name] 06/29/1941 77 y.o. female  [id number]  Admission Date: 12/11/2017  Discharge Date: 12/15/17  Physician: No att. providers found  Admission Diagnosis: Wound of right groin, subsequent encounter [S31.109D]   HPI:   This is a 77 y.o. female with a history of a fem tibial bypass with in situ vein although the proximal aspect is graft.  She had a draining sinus tract was taken to the operating room yesterday.  At time of operation her INR was 2.3.  Postoperatively she had some wound VAC dysfunction and this had to be changed at the bedside.  She was ultimately discharged.  On arrival home yesterday the wound VAC again began to dysfunction.  She is now here the wound VAC is been removed there is no excessive bleeding in the right groin per report.  She has been taking Keflex at home.  Hospital Course:  The patient was admitted to the hospital and placed on IV abx.  Her dressings were changed to wet to dry saline dressings as the vac would not hold a seal.   Her coumadin was continued at home dose as no intervention was planned.    On HD 2, her bypass continued to be patent with palpable graft pulse in the thigh.  There is a small portion of exposed graft in the groin but is well incorporated with no frank pus.  IV abx continued.    On HD 3, she was doing well.  Her wet to dry dressings are continued.  ID was consulted to advise on home abx therapy.  She had grown strep from the wound and her abx was changed to augmentin and she will continue this for 3 weeks.    On HD 4, she was doing well and discharged home with [facility] to address both her groin and foot.   The remainder of the hospital course consisted of increasing mobilization and increasing intake of solids without difficulty.  CBC    Component Value Date/Time   WBC 18.9 (H) 12/13/2017 0346   RBC 3.32 (L) 12/13/2017 0346   HGB 8.3 (L) 12/13/2017 0346   HCT 28.9 (L) 12/13/2017 0346   PLT 680  (H) 12/13/2017 0346   MCV 87.0 12/13/2017 0346   MCH 25.0 (L) 12/13/2017 0346   MCHC 28.7 (L) 12/13/2017 0346   RDW 22.9 (H) 12/13/2017 0346   LYMPHSABS 1.9 12/13/2017 0346   MONOABS 0.6 12/13/2017 0346   EOSABS 0.2 12/13/2017 0346   BASOSABS 0.2 (H) 12/13/2017 0346    BMET    Component Value Date/Time   NA 138 12/13/2017 0346   K 3.6 12/13/2017 0346   CL 105 12/13/2017 0346   CO2 22 12/13/2017 0346   GLUCOSE 77 12/13/2017 0346   BUN 20 12/13/2017 0346   CREATININE 1.24 (H) 12/13/2017 0346   CREATININE 1.21 (H) 08/14/2016 0733   CALCIUM 8.7 (L) 12/13/2017 0346   GFRNONAA 41 (L) 12/13/2017 0346   GFRAA 48 (L) 12/13/2017 0346      Discharge Instructions    Call MD for:  redness, tenderness, or signs of infection (pain, swelling, bleeding, redness, odor or green/yellow discharge around incision site)   Complete by:  As directed    Call MD for:  severe or increased pain, loss or decreased feeling  in affected limb(s)   Complete by:  As directed    Call MD for:  temperature >100.5   Complete by:  As directed    Discharge patient  Complete by:  As directed    Discharge disposition:  01-Home or Self Care   Discharge patient date:  12/15/2017   Discharge wound care:   Complete by:  As directed    Wet to dry saline dressing changes to right groin and right foot wound twice daily.   Lifting restrictions   Complete by:  As directed    No heavy lifting for 6 weeks   Resume previous diet   Complete by:  As directed       Discharge Diagnosis:  Wound of right groin, subsequent encounter [S31.109D]  Secondary Diagnosis: Patient Active Problem List   Diagnosis Date Noted  . Group B streptococcal infection 12/15/2017  . Open wound of right foot 12/15/2017  . Right groin wound 12/11/2017  . CKD (chronic kidney disease) stage 3, GFR 30-59 ml/min (HCC)   . Foot pain, right   . Cellulitis and abscess of right leg 06/27/2017  . Dry gangrene ([place]) 06/27/2017  . Hypokalemia  06/27/2017  . Osteomyelitis of right foot ([place]) 06/27/2017  . S/P femoral-popliteal bypass surgery   . Edema of right lower extremity 06/08/2017  . GERD (gastroesophageal reflux disease) 06/08/2017  . Pressure ulcer 05/29/2017  . Sepsis ([place]) 05/28/2017  . Hypotension 05/28/2017  . Pressure injury of skin 03/14/2017  . Acute renal failure (ARF) ([place])   . Hyperlipidemia 03/05/2017  . Lower extremity arterial insufficiency, severe, right ([place]) 03/04/2017  . Chronic respiratory failure with hypoxia ([place]) 02/12/2017  . Cellulitis 07/03/2016  . Anemia 07/03/2016  . Cough 11/29/2015  . Hypoxemia 11/29/2015  . CHF (congestive heart failure) ([place]) 11/29/2015  . Acute on chronic respiratory failure with hypoxia ([place])   . Pleural effusion on left   . Acute on chronic diastolic (congestive) heart failure ([place])   . Dyspnea 11/22/2014  . Pleural effusion 11/22/2014  . Hard of hearing 11/22/2014  . Thrombocytosis ([place]) 11/22/2014  . Acute respiratory failure with hypoxemia ([employer]) 11/22/2014  . Encounter for therapeutic drug monitoring 08/31/2013  . Aftercare following surgery of the circulatory system, [place] 12/21/2012  . Pain in limb 10/19/2012  . PVD (peripheral vascular disease) ([place]) 09/24/2012  . Swollen leg 09/24/2012  . Varicose veins of lower extremities with other complications 77/41/2878  . 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 Wound infection after surgery 01/07/2012  . Compartment syndrome, nontraumatic, lower extremity 11/29/2011  . Pain of right thigh 11/28/2011  . Hip fracture, right ([place]) 11/15/2011  . Chronic atrial fibrillation ([place]) 11/15/2011  . Anticoagulant long-term use 11/15/2011  . HTN (hypertension) 11/15/2011  . Hypothyroidism 11/15/2011  . 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 Anxiety   . Arthritis   . Atrial fibrillation, chronic  (HCC)    a. on Coumadin for anticoagulation.   . Cellulitis 06/29/2017   RIGHT LOWER LEG  . CHF (congestive heart failure) ([place])    combined  . COPD (chronic obstructive pulmonary disease) ([place])    on home O2 4 lpm  . Deaf   . Dry gangrene ([place])    R foot  . DVT (deep venous thrombosis) ([place])   . Essential hypertension   . GERD (gastroesophageal reflux disease)   . HOH (hard of hearing)   . Hyperlipidemia   . Hypothyroidism   . Open wound    right groin  . Oxygen dependent    4 lpm  .  Peripheral neuropathy   . Peripheral vascular disease ([place])   . 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 Ulcer of ankle ([place])    Bilateral 2015  . Varicose veins      Allergies as of 12/15/2017   No Known Allergies     Medication List    STOP taking these medications   cephALEXin 500 MG capsule Commonly known as:  KEFLEX     TAKE these medications   amoxicillin-clavulanate 875-125 MG tablet Commonly known as:  AUGMENTIN Take 1 tablet by mouth every 12 (twelve) hours for 21 days.   aspirin 81 MG EC tablet Take 1 tablet (81 mg total) by mouth daily.   diltiazem 240 MG 24 hr capsule Commonly known as:  CARDIZEM CD Take 240 mg by mouth daily.   furosemide 40 MG tablet Commonly known as:  LASIX Take 1 tablet (40 mg total) by mouth every other day.   HYDROcodone-acetaminophen 5-325 MG tablet Commonly known as:  NORCO/VICODIN Take 1 tablet by mouth every 6 (six) hours as needed for moderate pain. What changed:  when to take this   hydroxyurea 500 MG capsule Commonly known as:  HYDREA Take 1 capsule (500 mg total) by mouth daily. May take with food to minimize GI side effects.   levothyroxine 125 MCG tablet Commonly known as:  SYNTHROID, LEVOTHROID Take 125 mcg by mouth daily before breakfast.   pravastatin 20 MG tablet Commonly known as:  PRAVACHOL Take 10 mg by mouth daily.   torsemide 20 MG tablet Commonly known as:  DEMADEX Take 1 tablet (20 mg total) by mouth daily as  needed. May take for Weight gain of 3 lbs over 24 hours   warfarin 5 MG tablet Commonly known as:  COUMADIN Take as directed. If you are unsure how to take this medication, talk to your nurse or doctor. Original instructions:  Take 0.5-1 tablets (2.5-5 mg total) by mouth See admin instructions. 5 mg on Monday, all other days 2.5 mg What changed:    how much to take  how to take this  when to take this  additional instructions            Discharge Care Instructions  (From admission, onward)        Start     Ordered   12/15/17 0000  Discharge wound care:    Comments:  Wet to dry saline dressing changes to right groin and right foot wound twice daily.   12/15/17 1046      Prescriptions given: Augmentin 875-125 bid x 21 days  Instructions: 1.  Call for redness, tenderness, or signs of infection (pain, swelling, bleeding, redness, odor or green/yellow discharge around incision site) 2. Call for severe or increased pain, loss or decreased feeling in affected limb(s) 3.  Call for temperature > 100.5  Disposition: home with [facility]  Patient's condition: is Good  Follow up: 1. [facility] 12/25/17 for wound check   Leontine Locket, PA-C [facility] [phone] 12/21/2017  10:19 AM

## 2017-12-22 DIAGNOSIS — I2510 Atherosclerotic heart disease of native coronary artery without angina pectoris: Secondary | ICD-10-CM | POA: Diagnosis not present

## 2017-12-22 DIAGNOSIS — I110 Hypertensive heart disease with heart failure: Secondary | ICD-10-CM | POA: Diagnosis not present

## 2017-12-22 DIAGNOSIS — I482 Chronic atrial fibrillation: Secondary | ICD-10-CM | POA: Diagnosis not present

## 2017-12-22 DIAGNOSIS — I5032 Chronic diastolic (congestive) heart failure: Secondary | ICD-10-CM | POA: Diagnosis not present

## 2017-12-22 DIAGNOSIS — B9562 Methicillin resistant Staphylococcus aureus infection as the cause of diseases classified elsewhere: Secondary | ICD-10-CM | POA: Diagnosis not present

## 2017-12-22 DIAGNOSIS — T8743 Infection of amputation stump, right lower extremity: Secondary | ICD-10-CM | POA: Diagnosis not present

## 2017-12-24 ENCOUNTER — Ambulatory Visit (INDEPENDENT_AMBULATORY_CARE_PROVIDER_SITE_OTHER): Payer: Medicare Other | Admitting: *Deleted

## 2017-12-24 DIAGNOSIS — I482 Chronic atrial fibrillation, unspecified: Secondary | ICD-10-CM

## 2017-12-24 DIAGNOSIS — Z5181 Encounter for therapeutic drug level monitoring: Secondary | ICD-10-CM

## 2017-12-24 DIAGNOSIS — T8743 Infection of amputation stump, right lower extremity: Secondary | ICD-10-CM | POA: Diagnosis not present

## 2017-12-24 DIAGNOSIS — I2510 Atherosclerotic heart disease of native coronary artery without angina pectoris: Secondary | ICD-10-CM | POA: Diagnosis not present

## 2017-12-24 DIAGNOSIS — I110 Hypertensive heart disease with heart failure: Secondary | ICD-10-CM | POA: Diagnosis not present

## 2017-12-24 DIAGNOSIS — I5032 Chronic diastolic (congestive) heart failure: Secondary | ICD-10-CM | POA: Diagnosis not present

## 2017-12-24 DIAGNOSIS — B9562 Methicillin resistant Staphylococcus aureus infection as the cause of diseases classified elsewhere: Secondary | ICD-10-CM | POA: Diagnosis not present

## 2017-12-24 LAB — POCT INR: INR: 2.4 (ref 2.0–3.0)

## 2017-12-24 NOTE — Patient Instructions (Signed)
Continue coumadin 2.5mg  daily except 5mg  on Mondays Recheck in 2 weeks Order given to [facility]

## 2017-12-25 ENCOUNTER — Ambulatory Visit (INDEPENDENT_AMBULATORY_CARE_PROVIDER_SITE_OTHER): Payer: Medicare Other | Admitting: Physician Assistant

## 2017-12-25 ENCOUNTER — Encounter: Payer: Self-pay | Admitting: Physician Assistant

## 2017-12-25 VITALS — BP 101/69 | HR 94 | Temp 98.3°F | Resp 20 | Ht 72.0 in | Wt 149.0 lb

## 2017-12-25 DIAGNOSIS — S31109A Unspecified open wound of abdominal wall, unspecified quadrant without penetration into peritoneal cavity, initial encounter: Secondary | ICD-10-CM

## 2017-12-25 NOTE — Progress Notes (Signed)
POST OPERATIVE OFFICE NOTE    CC:  F/u for surgery  HPI:   77 year old female has undergone a previous right femoral to tibial bypass 03/11/2018, followed by right fifth toe amputation with a slow healing wound.  She has a draining sinus tract in the right groin with an underlying proximal piece of graft that is attached to the vein distally.  She is now indicated for debridement possible intervention possible negative pressure dressing.  S/P Debridement of right groin to 7 x 1.5 x 2cm deep,placement of negative pressure dressing 12/10/2017.  Findings: draining sinus tract was down to the level of the graft.  The graft itself was well incorporated.   Vein bypass was not identified but was palpable There was no pus.  Cultures were sent.  A wound VAC was placed with white sponge overlying the graft and black sponge above that.  Wound vac was changed multiple times and in the end it was discontinued.  Wet to dry dressing changes were started.  Cultures: FEW GROUP B STREP(S.AGALACTIAE)ISOLATED.  The patient was started on Augmentin 875-125 Q 12 for 21 days.        She is here today for f/u exam.  She denise fever, chills, V/N.   There have been no changes in her medical history.   No Known Allergies  Current Outpatient Medications  Medication Sig Dispense Refill  . amoxicillin-clavulanate (AUGMENTIN) 875-125 MG tablet Take 1 tablet by mouth every 12 (twelve) hours for 21 days. 42 tablet 0  . aspirin EC 81 MG EC tablet Take 1 tablet (81 mg total) by mouth daily.    Marland Kitchen diltiazem (CARDIZEM CD) 240 MG 24 hr capsule Take 240 mg by mouth daily.    . furosemide (LASIX) 40 MG tablet Take 1 tablet (40 mg total) by mouth every other day. 30 tablet   . HYDROcodone-acetaminophen (NORCO/VICODIN) 5-325 MG tablet Take 1 tablet by mouth every 6 (six) hours as needed for moderate pain. 30 tablet 0  . hydroxyurea (HYDREA) 500 MG capsule Take 1 capsule (500 mg total) by mouth daily. May take with food to minimize GI  side effects. 90 capsule 1  . levothyroxine (SYNTHROID, LEVOTHROID) 125 MCG tablet Take 125 mcg by mouth daily before breakfast.   1  . pravastatin (PRAVACHOL) 20 MG tablet Take 10 mg by mouth daily.    Marland Kitchen warfarin (COUMADIN) 5 MG tablet Take 0.5-1 tablets (2.5-5 mg total) by mouth See admin instructions. 5 mg on Monday, all other days 2.5 mg    . torsemide (DEMADEX) 20 MG tablet Take 1 tablet (20 mg total) by mouth daily as needed. May take for Weight gain of 3 lbs over 24 hours 30 tablet 11   No current facility-administered medications for this visit.      ROS:  See HPI  Physical Exam:  Vitals:   12/25/17 1315  BP: 101/69  Pulse: 94  Resp: 20  Temp: 98.3 F (36.8 C)  SpO2: 98%    Incision:  Right groin with beefy rd base, yellow eschar at skin edges.  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 Extremities:  *Doppler DP/PT/peroneal signals  Abdomen:  Soft + BS Hearty irregularly irregular Lungs on 3 L O2 24/7. Gen NAD  Assessment/Plan:  This is a 77 y.o. 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  Patent By pass with good doppler signals. The wounds  are healing slowly, but well with BID wet to dry dressing changes.  She is almost finished with oral Augmentin that was started on 12/15/2017 when she was discharged from the hospital.    Continue [facility] RN wound checks.  BID wet to dry dressing changes.  We will schedule her a f/u visit in 2 weeks for another wound check.     Silverio Decamp, PA-C [facility] [phone]  Clinic MD:  [name]

## 2017-12-31 DIAGNOSIS — I110 Hypertensive heart disease with heart failure: Secondary | ICD-10-CM | POA: Diagnosis not present

## 2017-12-31 DIAGNOSIS — T8743 Infection of amputation stump, right lower extremity: Secondary | ICD-10-CM | POA: Diagnosis not present

## 2017-12-31 DIAGNOSIS — I482 Chronic atrial fibrillation: Secondary | ICD-10-CM | POA: Diagnosis not present

## 2017-12-31 DIAGNOSIS — B9562 Methicillin resistant Staphylococcus aureus infection as the cause of diseases classified elsewhere: Secondary | ICD-10-CM | POA: Diagnosis not present

## 2017-12-31 DIAGNOSIS — I5032 Chronic diastolic (congestive) heart failure: Secondary | ICD-10-CM | POA: Diagnosis not present

## 2017-12-31 DIAGNOSIS — I2510 Atherosclerotic heart disease of native coronary artery without angina pectoris: Secondary | ICD-10-CM | POA: Diagnosis not present

## 2018-01-05 ENCOUNTER — Ambulatory Visit (INDEPENDENT_AMBULATORY_CARE_PROVIDER_SITE_OTHER): Payer: Medicare Other | Admitting: *Deleted

## 2018-01-05 DIAGNOSIS — Z5181 Encounter for therapeutic drug level monitoring: Secondary | ICD-10-CM

## 2018-01-05 DIAGNOSIS — I110 Hypertensive heart disease with heart failure: Secondary | ICD-10-CM | POA: Diagnosis not present

## 2018-01-05 DIAGNOSIS — I482 Chronic atrial fibrillation, unspecified: Secondary | ICD-10-CM

## 2018-01-05 DIAGNOSIS — T8743 Infection of amputation stump, right lower extremity: Secondary | ICD-10-CM | POA: Diagnosis not present

## 2018-01-05 DIAGNOSIS — I2510 Atherosclerotic heart disease of native coronary artery without angina pectoris: Secondary | ICD-10-CM | POA: Diagnosis not present

## 2018-01-05 DIAGNOSIS — B9562 Methicillin resistant Staphylococcus aureus infection as the cause of diseases classified elsewhere: Secondary | ICD-10-CM | POA: Diagnosis not present

## 2018-01-05 DIAGNOSIS — I5032 Chronic diastolic (congestive) heart failure: Secondary | ICD-10-CM | POA: Diagnosis not present

## 2018-01-05 LAB — POCT INR: INR: 1.8 — AB (ref 2.0–3.0)

## 2018-01-05 NOTE — Patient Instructions (Signed)
Take coumadin 5mg  tonight then resume 2.5mg  daily except 5mg  on Mondays Recheck in 1 week Order given to [facility]

## 2018-01-08 ENCOUNTER — Encounter: Payer: Self-pay | Admitting: Vascular Surgery

## 2018-01-08 ENCOUNTER — Other Ambulatory Visit: Payer: Self-pay

## 2018-01-08 ENCOUNTER — Ambulatory Visit (INDEPENDENT_AMBULATORY_CARE_PROVIDER_SITE_OTHER): Payer: Self-pay | Admitting: Vascular Surgery

## 2018-01-08 VITALS — BP 110/60 | HR 53 | Temp 98.1°F | Resp 20 | Ht 72.0 in | Wt 149.0 lb

## 2018-01-08 DIAGNOSIS — S31109A Unspecified open wound of abdominal wall, unspecified quadrant without penetration into peritoneal cavity, initial encounter: Secondary | ICD-10-CM

## 2018-01-08 LAB — FUNGUS CULTURE WITH STAIN

## 2018-01-08 LAB — FUNGAL ORGANISM REFLEX

## 2018-01-08 LAB — FUNGUS CULTURE RESULT

## 2018-01-08 NOTE — Progress Notes (Signed)
  Subjective:     Patient ID: Charlotte [name], female   DOB: [birth year], 77 y.o.   MRN: [id number]  HPI 77 year old female follows up after right groin debridement.  She continues to have home health nursing with the dry dressings in the right groin with very minimal drainage.  The right foot is very similar with stable wound.   Review of Systems Shortness of breath with activity Oxygen at home Wound in right groin and right lateral foot site of small toe amputation    Objective:   Physical Exam Awake alert and oriented Nonlabored respirations on home oxygen Right groin with fibrinous tissue Strong peroneal and DP signals and a palpable pulse on the right medial thigh.    Assessment:     77 year old female follows up for right groin wound check.  I placed silver nitrate on the right groin with a dry dressing.  We can move away from wet to dries at this time and hopefully this will heal.  There is underlying graft to the proximal anastomosis and this wound.  The right foot appears to be healing well with dry dressings as well.  We will have her follow-up in 4 to 6 weeks.  She is completed antibiotics.  If the wounds do not progress we can certainly see her sooner.  Emelly Wurtz C. [name], MD [facility] [place] Office: [phone] Pager: [phone]

## 2018-01-12 DIAGNOSIS — B9562 Methicillin resistant Staphylococcus aureus infection as the cause of diseases classified elsewhere: Secondary | ICD-10-CM | POA: Diagnosis not present

## 2018-01-12 DIAGNOSIS — I110 Hypertensive heart disease with heart failure: Secondary | ICD-10-CM | POA: Diagnosis not present

## 2018-01-12 DIAGNOSIS — T8743 Infection of amputation stump, right lower extremity: Secondary | ICD-10-CM | POA: Diagnosis not present

## 2018-01-12 DIAGNOSIS — I2510 Atherosclerotic heart disease of native coronary artery without angina pectoris: Secondary | ICD-10-CM | POA: Diagnosis not present

## 2018-01-12 DIAGNOSIS — I482 Chronic atrial fibrillation: Secondary | ICD-10-CM | POA: Diagnosis not present

## 2018-01-12 DIAGNOSIS — I5032 Chronic diastolic (congestive) heart failure: Secondary | ICD-10-CM | POA: Diagnosis not present

## 2018-01-19 DIAGNOSIS — I482 Chronic atrial fibrillation: Secondary | ICD-10-CM | POA: Diagnosis not present

## 2018-01-19 DIAGNOSIS — I110 Hypertensive heart disease with heart failure: Secondary | ICD-10-CM | POA: Diagnosis not present

## 2018-01-19 DIAGNOSIS — I5032 Chronic diastolic (congestive) heart failure: Secondary | ICD-10-CM | POA: Diagnosis not present

## 2018-01-19 DIAGNOSIS — I2510 Atherosclerotic heart disease of native coronary artery without angina pectoris: Secondary | ICD-10-CM | POA: Diagnosis not present

## 2018-01-19 DIAGNOSIS — B9562 Methicillin resistant Staphylococcus aureus infection as the cause of diseases classified elsewhere: Secondary | ICD-10-CM | POA: Diagnosis not present

## 2018-01-19 DIAGNOSIS — T8743 Infection of amputation stump, right lower extremity: Secondary | ICD-10-CM | POA: Diagnosis not present

## 2018-01-27 ENCOUNTER — Ambulatory Visit (INDEPENDENT_AMBULATORY_CARE_PROVIDER_SITE_OTHER): Payer: Medicare Other | Admitting: *Deleted

## 2018-01-27 DIAGNOSIS — Z5181 Encounter for therapeutic drug level monitoring: Secondary | ICD-10-CM

## 2018-01-27 DIAGNOSIS — I482 Chronic atrial fibrillation, unspecified: Secondary | ICD-10-CM

## 2018-01-27 DIAGNOSIS — I110 Hypertensive heart disease with heart failure: Secondary | ICD-10-CM | POA: Diagnosis not present

## 2018-01-27 DIAGNOSIS — I2510 Atherosclerotic heart disease of native coronary artery without angina pectoris: Secondary | ICD-10-CM | POA: Diagnosis not present

## 2018-01-27 DIAGNOSIS — B9562 Methicillin resistant Staphylococcus aureus infection as the cause of diseases classified elsewhere: Secondary | ICD-10-CM | POA: Diagnosis not present

## 2018-01-27 DIAGNOSIS — I5032 Chronic diastolic (congestive) heart failure: Secondary | ICD-10-CM | POA: Diagnosis not present

## 2018-01-27 DIAGNOSIS — T8743 Infection of amputation stump, right lower extremity: Secondary | ICD-10-CM | POA: Diagnosis not present

## 2018-01-27 LAB — POCT INR: INR: 2.1 (ref 2.0–3.0)

## 2018-01-27 NOTE — Patient Instructions (Signed)
Continue coumadin 2.5mg  daily except 5mg  on Mondays Recheck in 1 week Order given to [facility]

## 2018-01-30 DIAGNOSIS — B9562 Methicillin resistant Staphylococcus aureus infection as the cause of diseases classified elsewhere: Secondary | ICD-10-CM | POA: Diagnosis not present

## 2018-01-30 DIAGNOSIS — T8743 Infection of amputation stump, right lower extremity: Secondary | ICD-10-CM | POA: Diagnosis not present

## 2018-01-30 DIAGNOSIS — I5042 Chronic combined systolic (congestive) and diastolic (congestive) heart failure: Secondary | ICD-10-CM | POA: Diagnosis not present

## 2018-01-30 DIAGNOSIS — I110 Hypertensive heart disease with heart failure: Secondary | ICD-10-CM | POA: Diagnosis not present

## 2018-01-30 DIAGNOSIS — L02214 Cutaneous abscess of groin: Secondary | ICD-10-CM | POA: Diagnosis not present

## 2018-01-30 DIAGNOSIS — J449 Chronic obstructive pulmonary disease, unspecified: Secondary | ICD-10-CM | POA: Diagnosis not present

## 2018-02-02 ENCOUNTER — Ambulatory Visit (INDEPENDENT_AMBULATORY_CARE_PROVIDER_SITE_OTHER): Payer: Medicare Other | Admitting: *Deleted

## 2018-02-02 DIAGNOSIS — I482 Chronic atrial fibrillation, unspecified: Secondary | ICD-10-CM

## 2018-02-02 DIAGNOSIS — I5042 Chronic combined systolic (congestive) and diastolic (congestive) heart failure: Secondary | ICD-10-CM | POA: Diagnosis not present

## 2018-02-02 DIAGNOSIS — B9562 Methicillin resistant Staphylococcus aureus infection as the cause of diseases classified elsewhere: Secondary | ICD-10-CM | POA: Diagnosis not present

## 2018-02-02 DIAGNOSIS — Z5181 Encounter for therapeutic drug level monitoring: Secondary | ICD-10-CM

## 2018-02-02 DIAGNOSIS — T8743 Infection of amputation stump, right lower extremity: Secondary | ICD-10-CM | POA: Diagnosis not present

## 2018-02-02 DIAGNOSIS — J449 Chronic obstructive pulmonary disease, unspecified: Secondary | ICD-10-CM | POA: Diagnosis not present

## 2018-02-02 DIAGNOSIS — L02214 Cutaneous abscess of groin: Secondary | ICD-10-CM | POA: Diagnosis not present

## 2018-02-02 DIAGNOSIS — I110 Hypertensive heart disease with heart failure: Secondary | ICD-10-CM | POA: Diagnosis not present

## 2018-02-02 LAB — POCT INR: INR: 2 (ref 2.0–3.0)

## 2018-02-02 NOTE — Patient Instructions (Signed)
Take 5mg  tonight then resume 2.5mg  daily except 5mg  on Mondays Recheck in 2 weeks Order given to [facility]

## 2018-02-09 DIAGNOSIS — B9562 Methicillin resistant Staphylococcus aureus infection as the cause of diseases classified elsewhere: Secondary | ICD-10-CM | POA: Diagnosis not present

## 2018-02-09 DIAGNOSIS — I5042 Chronic combined systolic (congestive) and diastolic (congestive) heart failure: Secondary | ICD-10-CM | POA: Diagnosis not present

## 2018-02-09 DIAGNOSIS — I110 Hypertensive heart disease with heart failure: Secondary | ICD-10-CM | POA: Diagnosis not present

## 2018-02-09 DIAGNOSIS — T8743 Infection of amputation stump, right lower extremity: Secondary | ICD-10-CM | POA: Diagnosis not present

## 2018-02-09 DIAGNOSIS — J449 Chronic obstructive pulmonary disease, unspecified: Secondary | ICD-10-CM | POA: Diagnosis not present

## 2018-02-09 DIAGNOSIS — L02214 Cutaneous abscess of groin: Secondary | ICD-10-CM | POA: Diagnosis not present

## 2018-02-17 ENCOUNTER — Ambulatory Visit (INDEPENDENT_AMBULATORY_CARE_PROVIDER_SITE_OTHER): Payer: Medicare Other | Admitting: *Deleted

## 2018-02-17 DIAGNOSIS — J449 Chronic obstructive pulmonary disease, unspecified: Secondary | ICD-10-CM | POA: Diagnosis not present

## 2018-02-17 DIAGNOSIS — I482 Chronic atrial fibrillation, unspecified: Secondary | ICD-10-CM

## 2018-02-17 DIAGNOSIS — I110 Hypertensive heart disease with heart failure: Secondary | ICD-10-CM | POA: Diagnosis not present

## 2018-02-17 DIAGNOSIS — Z5181 Encounter for therapeutic drug level monitoring: Secondary | ICD-10-CM

## 2018-02-17 DIAGNOSIS — B9562 Methicillin resistant Staphylococcus aureus infection as the cause of diseases classified elsewhere: Secondary | ICD-10-CM | POA: Diagnosis not present

## 2018-02-17 DIAGNOSIS — T8743 Infection of amputation stump, right lower extremity: Secondary | ICD-10-CM | POA: Diagnosis not present

## 2018-02-17 DIAGNOSIS — I5042 Chronic combined systolic (congestive) and diastolic (congestive) heart failure: Secondary | ICD-10-CM | POA: Diagnosis not present

## 2018-02-17 DIAGNOSIS — L02214 Cutaneous abscess of groin: Secondary | ICD-10-CM | POA: Diagnosis not present

## 2018-02-17 LAB — POCT INR: INR: 1.4 — AB (ref 2.0–3.0)

## 2018-02-17 NOTE — Patient Instructions (Signed)
Take 5mg  tonight then increase dose to 2.5mg  daily except 5mg  on Mondays and Thursdays Recheck in 1 weeks Order given to [employer]

## 2018-02-19 ENCOUNTER — Ambulatory Visit (INDEPENDENT_AMBULATORY_CARE_PROVIDER_SITE_OTHER): Payer: Self-pay | Admitting: Family

## 2018-02-19 VITALS — BP 107/72 | HR 95 | Temp 96.9°F | Resp 20

## 2018-02-19 DIAGNOSIS — D45 Polycythemia vera: Secondary | ICD-10-CM

## 2018-02-19 DIAGNOSIS — I87303 Chronic venous hypertension (idiopathic) without complications of bilateral lower extremity: Secondary | ICD-10-CM

## 2018-02-19 DIAGNOSIS — I779 Disorder of arteries and arterioles, unspecified: Secondary | ICD-10-CM

## 2018-02-19 DIAGNOSIS — S31109D Unspecified open wound of abdominal wall, unspecified quadrant without penetration into peritoneal cavity, subsequent encounter: Secondary | ICD-10-CM

## 2018-02-19 DIAGNOSIS — Z7901 Long term (current) use of anticoagulants: Secondary | ICD-10-CM

## 2018-02-19 DIAGNOSIS — Z95828 Presence of other vascular implants and grafts: Secondary | ICD-10-CM

## 2018-02-19 MED ORDER — TRAMADOL HCL 50 MG PO TABS: 50.0000 mg | ORAL_TABLET | Freq: Four times a day (QID) | ORAL | 0 refills | Status: DC | PRN

## 2018-02-19 NOTE — Patient Instructions (Signed)

## 2018-02-19 NOTE — Progress Notes (Signed)
Postoperative Visit   History of Present Illness  Charlotte [name] is a 77 y.o. female who is s/p Debridement of right groin to 7 x 1.5 x 2cm deep, placement of negative pressure dressing on 12-10-17 by Dr. [name] for right groin draining sinus tract.  She is also s/p ray amputation of right fifth toe and placement of VAC on 09-29-17 by Dr. Scot Dock for nonhealing wound right fifth toe.  She has had several vascular procedures on her lower extremities, including Right femoral endarterectomy with Dacron patch angioplasty and profundoplasty and right femoral popliteal bypass graft using 6 mm Gore-Tex (above-knee) on 09-15-12 by Dr. Kellie Simmering.    Dr. [name] last evaluated pt on 01-08-18. At that time right groin with fibrinous tissue Strong peroneal and DP signals and a palpable pulse on the right medial thigh.   Dr. [name] placed silver nitrate on the right groin with a dry dressing.  We can move away from wet to dries at this time and hopefully this will heal.  There was underlying graft to the proximal anastomosis and this wound.  The right foot appeared to be healing well with dry dressings as well. Pt was to follow-up in 4 to 6 weeks.  She completed antibiotics.  If the wounds do not progress we can certainly see her sooner.  She returns today for wound evaluation.    She continues to have home health nursing apply dry dressings in the right groin with very minimal drainage.  The right foot is very similar with stable wound.   Review of Systems Shortness of breath with activity Oxygen at home Wound in right groin and right lateral foot site of small toe amputation  She no longer needs the wound vac.   She takes coumadin for atrial fib.  She has polycythemia vera.   She does not have DM and has never used tobacco.   She has COPD and CHF, has supplemental O2 via [place].    For VQI Use Only  PRE-ADM LIVING: Home  AMB STATUS: Wheelchair   Past Medical History:  Diagnosis Date  .  Anxiety   . Arthritis   . Atrial fibrillation, chronic (HCC)    a. on Coumadin for anticoagulation.   . Cellulitis 06/29/2017   RIGHT LOWER LEG  . CHF (congestive heart failure) ([place])    combined  . COPD (chronic obstructive pulmonary disease) ([place])    on home O2 4 lpm  . Deaf   . 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 DVT (deep venous thrombosis) ([place])   . Essential hypertension   . GERD (gastroesophageal reflux disease)   . HOH (hard of hearing)   . Hyperlipidemia   . Hypothyroidism   . Open wound    right groin  . Oxygen dependent    4 lpm  . Peripheral neuropathy   . Peripheral vascular disease ([place])   . 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 Ulcer of ankle ([place])    Bilateral 2015  . Varicose veins     Past Surgical History:  Procedure Laterality Date  . ABDOMINAL AORTAGRAM N/A 09/14/2012   Procedure: ABDOMINAL [name];  Surgeon: Serafina Mitchell, MD;  Location: [facility] CATH LAB;  Service: Cardiovascular;  Laterality: N/A;  . ABDOMINAL AORTOGRAM N/A 03/06/2017   Procedure: ABDOMINAL AORTOGRAM;  Surgeon: Elam Dutch, MD;  Location: [place] CV LAB;  Service: Cardiovascular;  Laterality: N/A;  . ABDOMINAL AORTOGRAM W/LOWER EXTREMITY N/A 09/21/2017   Procedure: ABDOMINAL AORTOGRAM  W/LOWER EXTREMITY;  Surgeon: Waynetta Sandy, MD;  Location: [place] CV LAB;  Service: Cardiovascular;  Laterality: N/A;  . ABDOMINAL HYSTERECTOMY    . AMPUTATION Right 09/29/2017   Procedure: DEBRIDEMENT TOE AMPUTATION RIGHT FIFTH TOE;  Surgeon: Angelia Mould, MD;  Location: [place];  Service: Vascular;  Laterality: Right;  . APPLICATION OF WOUND VAC Right 09/29/2017   Procedure: APPLICATION OF WOUND VAC ON RIGHT FOOT;  Surgeon: Angelia Mould, MD;  Location: [place];  Service: Vascular;  Laterality: Right;  . CATARACT EXTRACTION W/PHACO Right 08/31/2017   Procedure: CATARACT EXTRACTION PHACO AND INTRAOCULAR LENS PLACEMENT RIGHT EYE;  Surgeon: Tonny Branch, MD;  [facility]: [facility];  Service: Ophthalmology;  Laterality: Right;  CDE: 13.27  . CATARACT EXTRACTION W/PHACO Left 09/14/2017   Procedure: CATARACT EXTRACTION PHACO AND INTRAOCULAR LENS PLACEMENT LEFT EYE;  Surgeon: Tonny Branch, MD;  Location: [facility];  Service: Ophthalmology;  Laterality: Left;  CDE: 13.02  . CORONARY ANGIOPLASTY    . DRESSING CHANGE UNDER ANESTHESIA  12/02/2011   Procedure: DRESSING CHANGE UNDER ANESTHESIA;  Surgeon: Carole Civil, MD;  Location: [facility];  Service: Orthopedics;  Laterality: Right;  . ENDARTERECTOMY FEMORAL Right 09/15/2012   Procedure: ENDARTERECTOMY FEMORAL;  Surgeon: Mal Misty, MD;  Location: [place];  Service: Vascular;  Laterality: Right;  . FASCIOTOMY  11/29/2011   Procedure: FASCIOTOMY;  Surgeon: Carole Civil, MD;  [facility];  Service: Orthopedics;  Laterality: Right;  right thigh   . FEMORAL-POPLITEAL BYPASS GRAFT Right 09/15/2012   Procedure: BYPASS GRAFT FEMORAL-POPLITEAL ARTERY;  Surgeon: Mal Misty, MD;  Location: [place];  Service: Vascular;  Laterality: Right;  . FEMORAL-TIBIAL BYPASS GRAFT Right 03/11/2017   Procedure: RIGHT FEMORAL-TIBIAL BYPASS IN-SITU GREATER SAPHENOUS VEIN;  Surgeon: Waynetta Sandy, MD;  Location: [place];  Service: Vascular;  Laterality: Right;  . GROIN DEBRIDEMENT Right 12/10/2017   Procedure: GROIN DEBRIDEMENT EXPLORATION;  Surgeon: Waynetta Sandy, MD;  Location: [place];  Service: Vascular;  Laterality: Right;  . HIP PINNING,CANNULATED  11/19/2011   Procedure: CANNULATED HIP PINNING;  Surgeon: Sanjuana Kava, MD;  [facility]: [facility];  Service: Orthopedics;  Laterality: Right;  . LOWER EXTREMITY ANGIOGRAPHY Bilateral 03/06/2017   Procedure: Lower Extremity Angiography;  Surgeon: Elam Dutch, MD;  Location: [place] CV LAB;  Service: Cardiovascular;  Laterality: Bilateral;  . PATCH ANGIOPLASTY Right 09/15/2012   Procedure: PATCH ANGIOPLASTY;  Surgeon: Mal Misty, MD;  Location: [place];  Service: Vascular;   Laterality: Right;  . TUBAL LIGATION      Social History   Socioeconomic History  . Marital status: Married    Spouse name: Not on file  . Number of children: Not on file  . Years of education: Not on file  . Highest education level: Not on file  Occupational History  . Not on file  Social Needs  . Financial resource strain: Not on file  . Food insecurity:    Worry: Not on file    Inability: Not on file  . Transportation needs:    Medical: Not on file    Non-medical: Not on file  Tobacco Use  . Smoking status: Never Smoker  . Smokeless tobacco: Never Used  Substance and Sexual Activity  . Alcohol use: No    Alcohol/week: 0.0 oz  . Drug use: No  . Sexual activity: Not Currently  Lifestyle  . Physical activity:    Days per week: Not on file  Minutes per session: Not on file  . Stress: Not on file  Relationships  . Social connections:    Talks on phone: Not on file    Gets together: Not on file    Attends religious service: Not on file    Active member of club or organization: Not on file    Attends meetings of clubs or organizations: Not on file    Relationship status: Not on file  . Intimate partner violence:    Fear of current or ex partner: Not on file    Emotionally abused: Not on file    Physically abused: Not on file    Forced sexual activity: Not on file  Other Topics Concern  . Not on file  Social History Narrative  . Not on file    No Known Allergies  Current Outpatient Medications on File Prior to Visit  Medication Sig Dispense Refill  . aspirin EC 81 MG EC tablet Take 1 tablet (81 mg total) by mouth daily.    Marland Kitchen diltiazem (CARDIZEM CD) 240 MG 24 hr capsule Take 240 mg by mouth daily.    . furosemide (LASIX) 40 MG tablet Take 1 tablet (40 mg total) by mouth every other day. 30 tablet   . gabapentin (NEURONTIN) 100 MG capsule Take 100 mg by mouth 3 (three) times daily.  2  . HYDROcodone-acetaminophen (NORCO/VICODIN) 5-325 MG tablet Take 1 tablet  by mouth every 6 (six) hours as needed for moderate pain. 30 tablet 0  . hydroxyurea (HYDREA) 500 MG capsule Take 1 capsule (500 mg total) by mouth daily. May take with food to minimize GI side effects. 90 capsule 1  . levothyroxine (SYNTHROID, LEVOTHROID) 125 MCG tablet Take 125 mcg by mouth daily before breakfast.   1  . pravastatin (PRAVACHOL) 20 MG tablet Take 10 mg by mouth daily.    Marland Kitchen torsemide (DEMADEX) 20 MG tablet TAKE 1 TABLET BY MOUTH ONCE DAILY AS NEEDED MAY TAKE FOR WEIGHT GAIN OF 3 LBS OVER 24 HOURS  11  . warfarin (COUMADIN) 5 MG tablet Take 0.5-1 tablets (2.5-5 mg total) by mouth See admin instructions. 5 mg on Monday, all other days 2.5 mg    . torsemide (DEMADEX) 20 MG tablet Take 1 tablet (20 mg total) by mouth daily as needed. May take for Weight gain of 3 lbs over 24 hours 30 tablet 11   No current facility-administered medications on file prior to visit.      Physical Examination  Vitals:   02/19/18 1121  BP: 107/72  Pulse: 95  Resp: 20  Temp: (!) 96.9 F (36.1 C)  TempSrc: Oral  SpO2: 96%   There is no height or weight on file to calculate BMI.    Right groin, moderate amount clear watery drainage     Right lateral foot   Right 5th toe amputation site healing well Bilateral femoral pulses 2+ palpable  Medical Decision Making  SEVANNAH MADIA is a 77 y.o. female who is s/p Debridement of right groin to 7 x 1.5 x 2cm deep, placement of negative pressure dressing on 12-10-17 by Dr. [name] for right groin draining sinus tract.  She is also s/p ray amputation of right fifth toe and placement of VAC on 09-29-17 by Dr. Scot Dock for nonhealing wound right fifth toe. Wound vac has not been needed for a while.   She has had several vascular procedures on her lower extremities, including Right femoral endarterectomy with Dacron patch angioplasty and profundoplasty and  right femoral popliteal bypass graft using 6 mm Gore-Tex (above-knee) on 09-15-12 by Dr. Kellie Simmering.      The patient's right 5th toe amputation site and right groin wounds are healing well, see above photos.  Continue dry dressings to right groin to absorb moderate amount clear watery lymph dreanage, and dry dressing to right lateral foot to protect 5th toe amp site.  Return in 1 month with ABI's and right LE arterial duplex.   Husband states pt will occasionally have severe pain in rght foot, staes last pain pill was used last night; tramadol prescribed, sig: 1 tab po q6h prn pain, disp #20, 0 refills.    Clemon Chambers, RN, MSN, FNP-C [facility] Office: [phone]  02/19/2018, 12:07 PM  Clinic MD: [name]

## 2018-02-21 ENCOUNTER — Encounter: Payer: Self-pay | Admitting: Family

## 2018-02-23 ENCOUNTER — Ambulatory Visit (INDEPENDENT_AMBULATORY_CARE_PROVIDER_SITE_OTHER): Payer: Medicare Other | Admitting: *Deleted

## 2018-02-23 ENCOUNTER — Telehealth: Payer: Self-pay | Admitting: Cardiovascular Disease

## 2018-02-23 DIAGNOSIS — I5042 Chronic combined systolic (congestive) and diastolic (congestive) heart failure: Secondary | ICD-10-CM | POA: Diagnosis not present

## 2018-02-23 DIAGNOSIS — J449 Chronic obstructive pulmonary disease, unspecified: Secondary | ICD-10-CM | POA: Diagnosis not present

## 2018-02-23 DIAGNOSIS — T8743 Infection of amputation stump, right lower extremity: Secondary | ICD-10-CM | POA: Diagnosis not present

## 2018-02-23 DIAGNOSIS — B9562 Methicillin resistant Staphylococcus aureus infection as the cause of diseases classified elsewhere: Secondary | ICD-10-CM | POA: Diagnosis not present

## 2018-02-23 DIAGNOSIS — I482 Chronic atrial fibrillation, unspecified: Secondary | ICD-10-CM

## 2018-02-23 DIAGNOSIS — Z5181 Encounter for therapeutic drug level monitoring: Secondary | ICD-10-CM

## 2018-02-23 DIAGNOSIS — I110 Hypertensive heart disease with heart failure: Secondary | ICD-10-CM | POA: Diagnosis not present

## 2018-02-23 DIAGNOSIS — L02214 Cutaneous abscess of groin: Secondary | ICD-10-CM | POA: Diagnosis not present

## 2018-02-23 LAB — POCT INR: INR: 2.6 (ref 2.0–3.0)

## 2018-02-23 NOTE — Patient Instructions (Signed)
Continue coumadin 2.5mg  daily except 5mg  on Mondays and Thursdays Recheck in 1 week Order given to [employer]

## 2018-02-23 NOTE — Telephone Encounter (Signed)
26

## 2018-02-23 NOTE — Telephone Encounter (Signed)
Done.  See coumadin note. 

## 2018-02-24 ENCOUNTER — Other Ambulatory Visit: Payer: Self-pay

## 2018-02-24 DIAGNOSIS — I779 Disorder of arteries and arterioles, unspecified: Secondary | ICD-10-CM

## 2018-02-24 DIAGNOSIS — I739 Peripheral vascular disease, unspecified: Secondary | ICD-10-CM

## 2018-02-26 DIAGNOSIS — E039 Hypothyroidism, unspecified: Secondary | ICD-10-CM | POA: Diagnosis not present

## 2018-02-26 DIAGNOSIS — G894 Chronic pain syndrome: Secondary | ICD-10-CM | POA: Diagnosis not present

## 2018-02-26 DIAGNOSIS — Z681 Body mass index (BMI) 19 or less, adult: Secondary | ICD-10-CM | POA: Diagnosis not present

## 2018-02-26 DIAGNOSIS — Z1389 Encounter for screening for other disorder: Secondary | ICD-10-CM | POA: Diagnosis not present

## 2018-03-01 ENCOUNTER — Encounter: Payer: Self-pay | Admitting: Physician Assistant

## 2018-03-01 ENCOUNTER — Ambulatory Visit (INDEPENDENT_AMBULATORY_CARE_PROVIDER_SITE_OTHER): Payer: Medicare Other | Admitting: Physician Assistant

## 2018-03-01 VITALS — BP 124/68 | HR 70 | Ht 72.0 in | Wt 145.0 lb

## 2018-03-01 DIAGNOSIS — I482 Chronic atrial fibrillation, unspecified: Secondary | ICD-10-CM

## 2018-03-01 DIAGNOSIS — N183 Chronic kidney disease, stage 3 unspecified: Secondary | ICD-10-CM

## 2018-03-01 DIAGNOSIS — I779 Disorder of arteries and arterioles, unspecified: Secondary | ICD-10-CM

## 2018-03-01 DIAGNOSIS — I5032 Chronic diastolic (congestive) heart failure: Secondary | ICD-10-CM

## 2018-03-01 DIAGNOSIS — I739 Peripheral vascular disease, unspecified: Secondary | ICD-10-CM

## 2018-03-01 DIAGNOSIS — E782 Mixed hyperlipidemia: Secondary | ICD-10-CM

## 2018-03-01 DIAGNOSIS — I10 Essential (primary) hypertension: Secondary | ICD-10-CM | POA: Diagnosis not present

## 2018-03-01 NOTE — Progress Notes (Signed)
Cardiology Office Note    Date:  03/01/2018   ID:  FEATHER BERRIE, DOB 1941/06/14, MRN [id number]  PCP:  [name], MD  Cardiologist: Kate Sable, MD  Chief Complaint  Patient presents with  . Follow-up    History of Present Illness:  Charlotte [name] is a 77 y.o. 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 Servando Snare.  Nuclear stress test 03/2017 low risk, no ischemia EF 69%.  2D echo 03/2017 normal LVEF 60 to 65% with moderately dilated RV and moderate decreased systolic function, severe biatrial enlargement moderate pulmonary hypertension.  Patient was in atrial fibrillation.  Patient underwent femoral-tibial bypass that was complicated by wound VAC dysfunction and she was readmitted for debridement of the sinus tract in the right groin treated with antibiotics 12/2017.  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  Patient comes in today accompanied by her husband.  She complains of dyspnea on exertion with little activity that seems to be chronic.  Dr. Hilma Favors has adjusted her thyroid medications and seems to think this might be contributing to it.  She does have home health checking on her weekly taking her vital signs.  She does not notice the atrial fibrillation or her heart racing.  She denies chest pain, palpitations, dizziness or presyncope.  Working with the physical therapy to start walking again.  She takes a Demadex every 2 or 3 days if she gains 2 or 3 pounds overnight.  Past Medical History:  Diagnosis Date  . Anxiety   . Arthritis   . Atrial fibrillation, chronic (HCC)    a. on Coumadin for anticoagulation.   . Cellulitis 06/29/2017   RIGHT LOWER LEG  . CHF (congestive heart failure)  ([place])    combined  . COPD (chronic obstructive pulmonary disease) ([place])    on home O2 4 lpm  . Deaf   . 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 DVT (deep venous thrombosis) ([place])   . Essential hypertension   . GERD (gastroesophageal reflux disease)   . HOH (hard of hearing)   . Hyperlipidemia   . Hypothyroidism   . Open wound    right groin  . Oxygen dependent    4 lpm  . Peripheral neuropathy   . Peripheral vascular disease ([place])   . 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 Ulcer of ankle ([place])    Bilateral 2015  . Varicose veins     Past Surgical History:  Procedure Laterality Date  . ABDOMINAL AORTAGRAM N/A 09/14/2012   Procedure: ABDOMINAL [name];  Surgeon: Serafina Mitchell, MD;  [facility] CATH LAB;  Service: Cardiovascular;  Laterality: N/A;  . ABDOMINAL AORTOGRAM N/A 03/06/2017   Procedure: ABDOMINAL AORTOGRAM;  Surgeon: Elam Dutch, MD;  Location: [place] CV LAB;  Service: Cardiovascular;  Laterality: N/A;  . ABDOMINAL AORTOGRAM W/LOWER EXTREMITY N/A 09/21/2017   Procedure: ABDOMINAL AORTOGRAM W/LOWER EXTREMITY;  Surgeon: Waynetta Sandy, MD;  Location: [place] CV LAB;  Service: Cardiovascular;  Laterality: N/A;  . ABDOMINAL HYSTERECTOMY    . AMPUTATION Right 09/29/2017   Procedure: DEBRIDEMENT TOE AMPUTATION RIGHT FIFTH TOE;  Surgeon: Angelia Mould, MD;  Location: [place];  Service: Vascular;  Laterality: Right;  . APPLICATION  OF WOUND VAC Right 09/29/2017   Procedure: APPLICATION OF WOUND VAC ON RIGHT FOOT;  Surgeon: Angelia Mould, MD;  Location: [place];  Service: Vascular;  Laterality: Right;  . CATARACT EXTRACTION W/PHACO Right 08/31/2017   Procedure: CATARACT EXTRACTION PHACO AND INTRAOCULAR LENS PLACEMENT RIGHT EYE;  Surgeon: Tonny Branch, MD;  Location: [facility];  Service: Ophthalmology;  Laterality: Right;  CDE: 13.27  . CATARACT EXTRACTION W/PHACO Left 09/14/2017   Procedure: CATARACT EXTRACTION PHACO AND INTRAOCULAR LENS PLACEMENT LEFT  EYE;  Surgeon: Tonny Branch, MD;  Location: [facility];  Service: Ophthalmology;  Laterality: Left;  CDE: 13.02  . CORONARY ANGIOPLASTY    . DRESSING CHANGE UNDER ANESTHESIA  12/02/2011   Procedure: DRESSING CHANGE UNDER ANESTHESIA;  Surgeon: Carole Civil, MD;  Location: [facility];  Service: Orthopedics;  Laterality: Right;  . ENDARTERECTOMY FEMORAL Right 09/15/2012   Procedure: ENDARTERECTOMY FEMORAL;  Surgeon: Mal Misty, MD;  Location: [place];  Service: Vascular;  Laterality: Right;  . FASCIOTOMY  11/29/2011   Procedure: FASCIOTOMY;  Surgeon: Carole Civil, MD;  [facility]: [facility];  Service: Orthopedics;  Laterality: Right;  right thigh   . FEMORAL-POPLITEAL BYPASS GRAFT Right 09/15/2012   Procedure: BYPASS GRAFT FEMORAL-POPLITEAL ARTERY;  Surgeon: Mal Misty, MD;  Location: [place];  Service: Vascular;  Laterality: Right;  . FEMORAL-TIBIAL BYPASS GRAFT Right 03/11/2017   Procedure: RIGHT FEMORAL-TIBIAL BYPASS IN-SITU GREATER SAPHENOUS VEIN;  Surgeon: Waynetta Sandy, MD;  Location: [place];  Service: Vascular;  Laterality: Right;  . GROIN DEBRIDEMENT Right 12/10/2017   Procedure: GROIN DEBRIDEMENT EXPLORATION;  Surgeon: Waynetta Sandy, MD;  Location: [place];  Service: Vascular;  Laterality: Right;  . HIP PINNING,CANNULATED  11/19/2011   Procedure: CANNULATED HIP PINNING;  Surgeon: Sanjuana Kava, MD;  [facility]: [facility];  Service: Orthopedics;  Laterality: Right;  . LOWER EXTREMITY ANGIOGRAPHY Bilateral 03/06/2017   Procedure: Lower Extremity Angiography;  Surgeon: Elam Dutch, MD;  Location: [place] CV LAB;  Service: Cardiovascular;  Laterality: Bilateral;  . PATCH ANGIOPLASTY Right 09/15/2012   Procedure: PATCH ANGIOPLASTY;  Surgeon: Mal Misty, MD;  Location: [place];  Service: Vascular;  Laterality: Right;  . TUBAL LIGATION      Current Medications: Current Meds  Medication Sig  . aspirin EC 81 MG EC tablet Take 1 tablet (81 mg total) by mouth daily.  Marland Kitchen  diltiazem (CARDIZEM CD) 240 MG 24 hr capsule Take 240 mg by mouth daily.  . hydroxyurea (HYDREA) 500 MG capsule Take 1 capsule (500 mg total) by mouth daily. May take with food to minimize GI side effects.  Marland Kitchen levothyroxine (SYNTHROID, LEVOTHROID) 125 MCG tablet Take 125 mcg by mouth daily before breakfast.   . pravastatin (PRAVACHOL) 20 MG tablet Take 10 mg by mouth daily.  Marland Kitchen torsemide (DEMADEX) 20 MG tablet Take 1 tablet (20 mg total) by mouth daily as needed. May take for Weight gain of 3 lbs over 24 hours  . torsemide (DEMADEX) 20 MG tablet TAKE 1 TABLET BY MOUTH ONCE DAILY AS NEEDED MAY TAKE FOR WEIGHT GAIN OF 3 LBS OVER 24 HOURS  . traMADol (ULTRAM) 50 MG tablet Take 1 tablet (50 mg total) by mouth every 6 (six) hours as needed.  . warfarin (COUMADIN) 5 MG tablet Take 0.5-1 tablets (2.5-5 mg total) by mouth See admin instructions. 5 mg on Monday, all other days 2.5 mg     Allergies:   Patient has no known allergies.   Social History  Socioeconomic History  . Marital status: Married    Spouse name: Not on file  . Number of children: Not on file  . Years of education: Not on file  . Highest education level: Not on file  Occupational History  . Not on file  Social Needs  . Financial resource strain: Not on file  . Food insecurity:    Worry: Not on file    Inability: Not on file  . Transportation needs:    Medical: Not on file    Non-medical: Not on file  Tobacco Use  . Smoking status: Never Smoker  . Smokeless tobacco: Never Used  Substance and Sexual Activity  . Alcohol use: No    Alcohol/week: 0.0 oz  . Drug use: No  . Sexual activity: Not Currently  Lifestyle  . Physical activity:    Days per week: Not on file    Minutes per session: Not on file  . Stress: Not on file  Relationships  . Social connections:    Talks on phone: Not on file    Gets together: Not on file    Attends religious service: Not on file    Active member of club or organization: Not on file     Attends meetings of clubs or organizations: Not on file    Relationship status: Not on file  Other Topics Concern  . Not on file  Social History Narrative  . Not on file     Family History:  The patient's family history includes Diabetes in her son; Heart disease in her mother and sister; Heart failure in her mother; Hypertension in her sister and sister; Stroke in her father and sister.   ROS:   Please see the history of present illness.    Review of Systems  Constitution: Negative.  HENT: Positive for hearing loss.   Eyes: Negative.   Cardiovascular: Positive for dyspnea on exertion.  Respiratory: Positive for shortness of breath.   Hematologic/Lymphatic: Negative.   Musculoskeletal: Positive for joint pain.  Gastrointestinal: Negative.   Genitourinary: Negative.   Neurological: Positive for numbness, sensory change and weakness.   All other systems reviewed and are negative.   PHYSICAL EXAM:   VS:  BP 124/68   Pulse 70   Ht 6' (1.829 m)   Wt 145 lb (65.8 kg) Comment: per pt  LMP  (LMP Unknown)   SpO2 94%   BMI 19.67 kg/m   Physical Exam  GEN: Thin, in a wheelchair, in no acute distress  Neck: no JVD, carotid bruits, or masses Cardiac: Irregular irregular with 1/6 systolic murmur at left sternal border, no rubs, or gallops  Respiratory: Decreased breath sounds throughout without wheezing rales or rhonchi GI: soft, nontender, nondistended, + BS Ext: Right foot in a boot bandaged, varicosities and cyanosis with decreased distal pulses bilaterally  Neuro:  Alert and Oriented x 3 Psych: euthymic mood, full affect  Wt Readings from Last 3 Encounters:  03/01/18 145 lb (65.8 kg)  01/08/18 149 lb (67.6 kg)  12/25/17 149 lb (67.6 kg)      Studies/Labs Reviewed:   EKG:  EKG is not ordered today.   Recent Labs: 03/29/2017: Magnesium 1.6 05/28/2017: B Natriuretic Peptide 605.0; TSH 11.084 11/11/2017: ALT <5 12/13/2017: BUN 20; Creatinine, Ser 1.24; Hemoglobin 8.3;  Platelets 680; Potassium 3.6; Sodium 138   Lipid Panel    Component Value Date/Time   CHOL  07/19/2009 0448    119        ATP III  CLASSIFICATION:  <200     mg/dL   Desirable  200-239  mg/dL   Borderline High  >=240    mg/dL   High          TRIG 124 07/19/2009 0448   HDL 32 (L) 07/19/2009 0448   CHOLHDL 3.7 07/19/2009 0448   VLDL 25 07/19/2009 0448   LDLCALC  07/19/2009 0448    62        Total Cholesterol/HDL:CHD Risk Coronary Heart Disease Risk Table                     Men   Women  1/2 Average Risk   3.4   3.3  Average Risk       5.0   4.4  2 X Average Risk   9.6   7.1  3 X Average Risk  23.4   11.0        Use the calculated Patient Ratio above and the CHD Risk Table to determine the patient's CHD Risk.        ATP III CLASSIFICATION (LDL):  <100     mg/dL   Optimal  100-129  mg/dL   Near or Above                    Optimal  130-159  mg/dL   Borderline  160-189  mg/dL   High  >190     mg/dL   Very High    Additional studies/ records that were reviewed today include:  2D echo 03/24/2017  ------------------------------------------------------------------- Study Conclusions   - Left ventricle: The cavity size was normal. Wall thickness was   normal. Systolic function was normal. The estimated ejection   fraction was in the range of 60% to 65%. Indeterminant diastolic   function (atrial fibrillation). Wall motion was normal; there   were no regional wall motion abnormalities. - Ventricular septum: D-shaped interventricular septum suggestive   of RV pressure/volume overload. - Aortic valve: There was no stenosis. - Mitral valve: Mildly calcified annulus. There was mild   regurgitation. - Left atrium: The atrium was severely dilated. - Right ventricle: The cavity size was moderately dilated. Systolic   function was moderately reduced. - Right atrium: The atrium was severely dilated. - Tricuspid valve: Peak RV-RA gradient (S): 43 mm Hg. - Pulmonary arteries: PA  peak pressure: 58 mm Hg (S). - Systemic veins: IVC measured 2.5 cm with < 50% respirophasic   variation, suggesting RA pressure 15 mmHg. - Pericardium, extracardiac: There is a left-sided pleural   effusion.   Impressions:   - The patient was in atrial fibrillation. Normal LV size with EF   60-65%. D-shaped interventricular septum suggesting RV   pressure/volume overload. Moderately dilated RV with moderately   decreased systolic function. Severe biatrial enlargement.   Moderate pulmonary hypertension. Dilated IVC suggestive of   elevated RV filling pressure.  Nuclear stress test 8/4/2018IMPRESSION: 1. No reversible ischemia or infarction.   2. Normal left ventricular wall motion.   3. Left ventricular ejection fraction 69%   4. Non invasive risk stratification*: Low     ASSESSMENT:    1. Chronic atrial fibrillation ([place])   2. Chronic diastolic congestive heart failure ([place])   3. Essential hypertension   4. PVD (peripheral vascular disease) ([place])   5. CKD (chronic kidney disease) stage 3, GFR 30-59 ml/min (HCC)   6. Mixed hyperlipidemia      PLAN:  In order of problems listed above:  Chronic  atrial fibrillation on Coumadin-rate seems to be controlled with diltiazem.  She does have dyspnea on exertion with little activity and I wonder if her heart rate is going up.  Home health is working with her and can keep an eye on this.  Her thyroid is also being adjusted by Dr. Hilma Favors.  Can also place a monitor if necessary once thyroid is controlled.  Chronic diastolic CHF well compensated.  Uses torsemide every couple days for weight gain of 2 or 3 pounds overnight. labs checked by Dr. Hilma Favors.  Essential hypertension blood pressure well controlled  PVD followed closely by Dr. Sable Feil above  CKD stage 3 blood work checked by Dr. Hilma Favors  Mixed hyperlipidemia on Pravachol, managed by PCP    Medication Adjustments/Labs and Tests Ordered: Current medicines are reviewed at  length with the patient today.  Concerns regarding medicines are outlined above.  Medication changes, Labs and Tests ordered today are listed in the Patient Instructions below. There are no Patient Instructions on file for this visit.   Signed, Ermalinda Barrios, PA-C  03/01/2018 2:20 PM    [facility] [address], [place], [place]  [address] Phone: [phone]; Fax: [phone]

## 2018-03-01 NOTE — Patient Instructions (Signed)
Medication Instructions:  Your physician recommends that you continue on your current medications as directed. Please refer to the Current Medication list given to you today.   Labwork: NONE   Testing/Procedures: NONE   Follow-Up: Your physician wants you to follow-up in: 4-5 Months. You will receive a reminder letter in the mail two months in advance. If you don't receive a letter, please call our office to schedule the follow-up appointment.'   Any Other Special Instructions Will Be Listed Below (If Applicable).     If you need a refill on your cardiac medications before your next appointment, please call your pharmacy.

## 2018-03-02 ENCOUNTER — Ambulatory Visit (INDEPENDENT_AMBULATORY_CARE_PROVIDER_SITE_OTHER): Payer: Medicare Other | Admitting: *Deleted

## 2018-03-02 DIAGNOSIS — Z5181 Encounter for therapeutic drug level monitoring: Secondary | ICD-10-CM

## 2018-03-02 DIAGNOSIS — T8743 Infection of amputation stump, right lower extremity: Secondary | ICD-10-CM | POA: Diagnosis not present

## 2018-03-02 DIAGNOSIS — I482 Chronic atrial fibrillation, unspecified: Secondary | ICD-10-CM

## 2018-03-02 DIAGNOSIS — I110 Hypertensive heart disease with heart failure: Secondary | ICD-10-CM | POA: Diagnosis not present

## 2018-03-02 DIAGNOSIS — B9562 Methicillin resistant Staphylococcus aureus infection as the cause of diseases classified elsewhere: Secondary | ICD-10-CM | POA: Diagnosis not present

## 2018-03-02 DIAGNOSIS — I5042 Chronic combined systolic (congestive) and diastolic (congestive) heart failure: Secondary | ICD-10-CM | POA: Diagnosis not present

## 2018-03-02 DIAGNOSIS — J449 Chronic obstructive pulmonary disease, unspecified: Secondary | ICD-10-CM | POA: Diagnosis not present

## 2018-03-02 DIAGNOSIS — L02214 Cutaneous abscess of groin: Secondary | ICD-10-CM | POA: Diagnosis not present

## 2018-03-02 LAB — POCT INR: INR: 2.1 (ref 2.0–3.0)

## 2018-03-02 NOTE — Patient Instructions (Signed)
Continue coumadin 2.5mg  daily except 5mg  on Mondays and Thursdays Recheck in 2 weeks Order given to [facility]

## 2018-03-04 DIAGNOSIS — I110 Hypertensive heart disease with heart failure: Secondary | ICD-10-CM | POA: Diagnosis not present

## 2018-03-04 DIAGNOSIS — L02214 Cutaneous abscess of groin: Secondary | ICD-10-CM | POA: Diagnosis not present

## 2018-03-04 DIAGNOSIS — I5042 Chronic combined systolic (congestive) and diastolic (congestive) heart failure: Secondary | ICD-10-CM | POA: Diagnosis not present

## 2018-03-04 DIAGNOSIS — J449 Chronic obstructive pulmonary disease, unspecified: Secondary | ICD-10-CM | POA: Diagnosis not present

## 2018-03-04 DIAGNOSIS — B9562 Methicillin resistant Staphylococcus aureus infection as the cause of diseases classified elsewhere: Secondary | ICD-10-CM | POA: Diagnosis not present

## 2018-03-04 DIAGNOSIS — T8743 Infection of amputation stump, right lower extremity: Secondary | ICD-10-CM | POA: Diagnosis not present

## 2018-03-09 DIAGNOSIS — I5042 Chronic combined systolic (congestive) and diastolic (congestive) heart failure: Secondary | ICD-10-CM | POA: Diagnosis not present

## 2018-03-09 DIAGNOSIS — T8743 Infection of amputation stump, right lower extremity: Secondary | ICD-10-CM | POA: Diagnosis not present

## 2018-03-09 DIAGNOSIS — L02214 Cutaneous abscess of groin: Secondary | ICD-10-CM | POA: Diagnosis not present

## 2018-03-09 DIAGNOSIS — I110 Hypertensive heart disease with heart failure: Secondary | ICD-10-CM | POA: Diagnosis not present

## 2018-03-09 DIAGNOSIS — J449 Chronic obstructive pulmonary disease, unspecified: Secondary | ICD-10-CM | POA: Diagnosis not present

## 2018-03-09 DIAGNOSIS — B9562 Methicillin resistant Staphylococcus aureus infection as the cause of diseases classified elsewhere: Secondary | ICD-10-CM | POA: Diagnosis not present

## 2018-03-11 DIAGNOSIS — B9562 Methicillin resistant Staphylococcus aureus infection as the cause of diseases classified elsewhere: Secondary | ICD-10-CM | POA: Diagnosis not present

## 2018-03-11 DIAGNOSIS — J449 Chronic obstructive pulmonary disease, unspecified: Secondary | ICD-10-CM | POA: Diagnosis not present

## 2018-03-11 DIAGNOSIS — I5042 Chronic combined systolic (congestive) and diastolic (congestive) heart failure: Secondary | ICD-10-CM | POA: Diagnosis not present

## 2018-03-11 DIAGNOSIS — I110 Hypertensive heart disease with heart failure: Secondary | ICD-10-CM | POA: Diagnosis not present

## 2018-03-11 DIAGNOSIS — T8743 Infection of amputation stump, right lower extremity: Secondary | ICD-10-CM | POA: Diagnosis not present

## 2018-03-11 DIAGNOSIS — L02214 Cutaneous abscess of groin: Secondary | ICD-10-CM | POA: Diagnosis not present

## 2018-03-12 ENCOUNTER — Inpatient Hospital Stay (HOSPITAL_COMMUNITY): Payer: Medicare Other | Attending: Hematology

## 2018-03-12 DIAGNOSIS — D500 Iron deficiency anemia secondary to blood loss (chronic): Secondary | ICD-10-CM | POA: Insufficient documentation

## 2018-03-12 DIAGNOSIS — I482 Chronic atrial fibrillation: Secondary | ICD-10-CM | POA: Insufficient documentation

## 2018-03-12 DIAGNOSIS — R5383 Other fatigue: Secondary | ICD-10-CM | POA: Insufficient documentation

## 2018-03-12 DIAGNOSIS — K219 Gastro-esophageal reflux disease without esophagitis: Secondary | ICD-10-CM | POA: Diagnosis not present

## 2018-03-12 DIAGNOSIS — Z9981 Dependence on supplemental oxygen: Secondary | ICD-10-CM | POA: Diagnosis not present

## 2018-03-12 DIAGNOSIS — E039 Hypothyroidism, unspecified: Secondary | ICD-10-CM | POA: Diagnosis not present

## 2018-03-12 DIAGNOSIS — D45 Polycythemia vera: Secondary | ICD-10-CM | POA: Diagnosis not present

## 2018-03-12 DIAGNOSIS — E785 Hyperlipidemia, unspecified: Secondary | ICD-10-CM | POA: Diagnosis not present

## 2018-03-12 DIAGNOSIS — Z7901 Long term (current) use of anticoagulants: Secondary | ICD-10-CM | POA: Diagnosis not present

## 2018-03-12 DIAGNOSIS — Z89421 Acquired absence of other right toe(s): Secondary | ICD-10-CM | POA: Diagnosis not present

## 2018-03-12 DIAGNOSIS — Z7982 Long term (current) use of aspirin: Secondary | ICD-10-CM | POA: Diagnosis not present

## 2018-03-12 DIAGNOSIS — M1990 Unspecified osteoarthritis, unspecified site: Secondary | ICD-10-CM | POA: Insufficient documentation

## 2018-03-12 DIAGNOSIS — I5033 Acute on chronic diastolic (congestive) heart failure: Secondary | ICD-10-CM | POA: Diagnosis not present

## 2018-03-12 DIAGNOSIS — I130 Hypertensive heart and chronic kidney disease with heart failure and stage 1 through stage 4 chronic kidney disease, or unspecified chronic kidney disease: Secondary | ICD-10-CM | POA: Diagnosis not present

## 2018-03-12 DIAGNOSIS — R0602 Shortness of breath: Secondary | ICD-10-CM | POA: Insufficient documentation

## 2018-03-12 DIAGNOSIS — Z79899 Other long term (current) drug therapy: Secondary | ICD-10-CM | POA: Insufficient documentation

## 2018-03-12 DIAGNOSIS — I10 Essential (primary) hypertension: Secondary | ICD-10-CM | POA: Insufficient documentation

## 2018-03-12 DIAGNOSIS — I739 Peripheral vascular disease, unspecified: Secondary | ICD-10-CM | POA: Diagnosis not present

## 2018-03-12 DIAGNOSIS — J449 Chronic obstructive pulmonary disease, unspecified: Secondary | ICD-10-CM | POA: Insufficient documentation

## 2018-03-12 LAB — COMPREHENSIVE METABOLIC PANEL
ALT: 7 U/L (ref 0–44)
AST: 11 U/L — ABNORMAL LOW (ref 15–41)
Albumin: 3.1 g/dL — ABNORMAL LOW (ref 3.5–5.0)
Alkaline Phosphatase: 106 U/L (ref 38–126)
Anion gap: 7 (ref 5–15)
BUN: 12 mg/dL (ref 8–23)
CHLORIDE: 106 mmol/L (ref 98–111)
CO2: 24 mmol/L (ref 22–32)
CREATININE: 0.87 mg/dL (ref 0.44–1.00)
Calcium: 8.7 mg/dL — ABNORMAL LOW (ref 8.9–10.3)
Glucose, Bld: 88 mg/dL (ref 70–99)
POTASSIUM: 4.5 mmol/L (ref 3.5–5.1)
Sodium: 137 mmol/L (ref 135–145)
Total Bilirubin: 1.4 mg/dL — ABNORMAL HIGH (ref 0.3–1.2)
Total Protein: 6 g/dL — ABNORMAL LOW (ref 6.5–8.1)

## 2018-03-12 LAB — CBC WITH DIFFERENTIAL/PLATELET
Basophils Absolute: 0.4 10*3/uL — ABNORMAL HIGH (ref 0.0–0.1)
Basophils Relative: 2 %
EOS PCT: 1 %
Eosinophils Absolute: 0.2 10*3/uL (ref 0.0–0.7)
HCT: 30.2 % — ABNORMAL LOW (ref 36.0–46.0)
Hemoglobin: 8.3 g/dL — ABNORMAL LOW (ref 12.0–15.0)
LYMPHS ABS: 1.3 10*3/uL (ref 0.7–4.0)
Lymphocytes Relative: 6 %
MCH: 20.4 pg — AB (ref 26.0–34.0)
MCHC: 27.5 g/dL — ABNORMAL LOW (ref 30.0–36.0)
MCV: 74.2 fL — AB (ref 78.0–100.0)
MONO ABS: 0.7 10*3/uL (ref 0.1–1.0)
Monocytes Relative: 3 %
Neutro Abs: 19.2 10*3/uL — ABNORMAL HIGH (ref 1.7–7.7)
Neutrophils Relative %: 88 %
PLATELETS: 325 10*3/uL (ref 150–400)
RBC: 4.07 MIL/uL (ref 3.87–5.11)
RDW: 21.1 % — AB (ref 11.5–15.5)
WBC: 21.8 10*3/uL — AB (ref 4.0–10.5)

## 2018-03-12 LAB — LACTATE DEHYDROGENASE: LDH: 199 U/L — AB (ref 98–192)

## 2018-03-12 LAB — FERRITIN: Ferritin: 83 ng/mL (ref 11–307)

## 2018-03-16 ENCOUNTER — Ambulatory Visit (INDEPENDENT_AMBULATORY_CARE_PROVIDER_SITE_OTHER): Payer: Medicare Other | Admitting: *Deleted

## 2018-03-16 ENCOUNTER — Telehealth: Payer: Self-pay | Admitting: *Deleted

## 2018-03-16 DIAGNOSIS — B9562 Methicillin resistant Staphylococcus aureus infection as the cause of diseases classified elsewhere: Secondary | ICD-10-CM | POA: Diagnosis not present

## 2018-03-16 DIAGNOSIS — Z5181 Encounter for therapeutic drug level monitoring: Secondary | ICD-10-CM | POA: Diagnosis not present

## 2018-03-16 DIAGNOSIS — I482 Chronic atrial fibrillation, unspecified: Secondary | ICD-10-CM

## 2018-03-16 DIAGNOSIS — J449 Chronic obstructive pulmonary disease, unspecified: Secondary | ICD-10-CM | POA: Diagnosis not present

## 2018-03-16 DIAGNOSIS — I5042 Chronic combined systolic (congestive) and diastolic (congestive) heart failure: Secondary | ICD-10-CM | POA: Diagnosis not present

## 2018-03-16 DIAGNOSIS — I110 Hypertensive heart disease with heart failure: Secondary | ICD-10-CM | POA: Diagnosis not present

## 2018-03-16 DIAGNOSIS — T8743 Infection of amputation stump, right lower extremity: Secondary | ICD-10-CM | POA: Diagnosis not present

## 2018-03-16 DIAGNOSIS — L02214 Cutaneous abscess of groin: Secondary | ICD-10-CM | POA: Diagnosis not present

## 2018-03-16 LAB — POCT INR: INR: 2.4 (ref 2.0–3.0)

## 2018-03-16 NOTE — Telephone Encounter (Signed)
Done.  See coumadin note. 

## 2018-03-16 NOTE — Patient Instructions (Signed)
Continue coumadin 2.5mg  daily except 5mg  on Mondays and Thursdays Recheck in 2 weeks Order given to [employer]

## 2018-03-16 NOTE — Telephone Encounter (Signed)
24

## 2018-03-18 ENCOUNTER — Encounter (HOSPITAL_COMMUNITY): Payer: Self-pay | Admitting: Internal Medicine

## 2018-03-18 ENCOUNTER — Ambulatory Visit (HOSPITAL_COMMUNITY): Payer: Medicare Other | Admitting: Internal Medicine

## 2018-03-18 ENCOUNTER — Inpatient Hospital Stay (HOSPITAL_BASED_OUTPATIENT_CLINIC_OR_DEPARTMENT_OTHER): Payer: Medicare Other | Admitting: Internal Medicine

## 2018-03-18 ENCOUNTER — Other Ambulatory Visit (HOSPITAL_COMMUNITY): Payer: Medicare Other

## 2018-03-18 VITALS — BP 113/65 | HR 99 | Temp 97.8°F | Resp 16 | Wt 142.5 lb

## 2018-03-18 DIAGNOSIS — I482 Chronic atrial fibrillation: Secondary | ICD-10-CM

## 2018-03-18 DIAGNOSIS — Z79899 Other long term (current) drug therapy: Secondary | ICD-10-CM

## 2018-03-18 DIAGNOSIS — J449 Chronic obstructive pulmonary disease, unspecified: Secondary | ICD-10-CM | POA: Diagnosis not present

## 2018-03-18 DIAGNOSIS — D45 Polycythemia vera: Secondary | ICD-10-CM

## 2018-03-18 DIAGNOSIS — Z9981 Dependence on supplemental oxygen: Secondary | ICD-10-CM

## 2018-03-18 DIAGNOSIS — D500 Iron deficiency anemia secondary to blood loss (chronic): Secondary | ICD-10-CM | POA: Diagnosis not present

## 2018-03-18 DIAGNOSIS — I130 Hypertensive heart and chronic kidney disease with heart failure and stage 1 through stage 4 chronic kidney disease, or unspecified chronic kidney disease: Secondary | ICD-10-CM | POA: Diagnosis not present

## 2018-03-18 DIAGNOSIS — E039 Hypothyroidism, unspecified: Secondary | ICD-10-CM

## 2018-03-18 DIAGNOSIS — R0602 Shortness of breath: Secondary | ICD-10-CM | POA: Diagnosis not present

## 2018-03-18 DIAGNOSIS — E785 Hyperlipidemia, unspecified: Secondary | ICD-10-CM

## 2018-03-18 DIAGNOSIS — R5383 Other fatigue: Secondary | ICD-10-CM

## 2018-03-18 DIAGNOSIS — I10 Essential (primary) hypertension: Secondary | ICD-10-CM

## 2018-03-18 DIAGNOSIS — Z89421 Acquired absence of other right toe(s): Secondary | ICD-10-CM

## 2018-03-18 DIAGNOSIS — I5033 Acute on chronic diastolic (congestive) heart failure: Secondary | ICD-10-CM | POA: Diagnosis not present

## 2018-03-18 DIAGNOSIS — I739 Peripheral vascular disease, unspecified: Secondary | ICD-10-CM

## 2018-03-18 DIAGNOSIS — Z7901 Long term (current) use of anticoagulants: Secondary | ICD-10-CM

## 2018-03-18 DIAGNOSIS — M1990 Unspecified osteoarthritis, unspecified site: Secondary | ICD-10-CM

## 2018-03-18 DIAGNOSIS — K219 Gastro-esophageal reflux disease without esophagitis: Secondary | ICD-10-CM

## 2018-03-18 HISTORY — DX: Iron deficiency anemia secondary to blood loss (chronic): D50.0

## 2018-03-18 NOTE — Patient Instructions (Signed)
[facility] at [facility] Discharge Instructions  You saw Dr. Higgs today.   Thank you for choosing [facility] at [facility] to provide your oncology and hematology care.  To afford each patient quality time with our provider, please arrive at least 15 minutes before your scheduled appointment time.   If you have a lab appointment with the Cancer Center please come in thru the  Main Entrance and check in at the main information desk  You need to re-schedule your appointment should you arrive 10 or more minutes late.  We strive to give you quality time with our providers, and arriving late affects you and other patients whose appointments are after yours.  Also, if you no show three or more times for appointments you may be dismissed from the clinic at the providers discretion.     Again, thank you for choosing [facility].  Our hope is that these requests will decrease the amount of time that you wait before being seen by our physicians.       _____________________________________________________________  Should you have questions after your visit to [facility], please contact our office at [phone] between the hours of 8:00 a.m. and 4:30 p.m.  Voicemails left after 4:00 p.m. will not be returned until the following business day.  For prescription refill requests, have your pharmacy contact our office and allow 72 hours.    Cancer Center Support Programs:   > Cancer Support Group  2nd Tuesday of the month 1pm-2pm, Journey Room    

## 2018-03-18 NOTE — Progress Notes (Signed)
Diagnosis Iron deficiency anemia due to chronic blood loss - Plan: CBC with Differential/Platelet, Comprehensive metabolic panel, Lactate dehydrogenase, Ferritin, Hemoglobinopathy evaluation, Vitamin B12, Folate, Methylmalonic acid, serum, Alpha-Thalassemia GenotypR  Staging Cancer Staging No matching staging information was found for the patient.  Assessment and Plan:   1.  Charlotte [name].  77 y.o. Female followed by Dr. Talbert [name]. for JAK 2 positive myeloproliferative disease. Presentation is most consistent with Charlotte [name].    Labs done 03/12/2018 reviewed and showed WBC 21.8 HB 8.3 plts 325,000.  Chemistries WNL with potassium of 4.5, Cr 0.87 and normal LFTs.    I have discussed with pt and family member that WBC is elevated compared to previous.   They report compliance with medication.  It is noted MCV is decreased at 74 when Charlotte [name] usually causes an increase in MCV.  I have recommended they  Increase dose to 1000 mg daily.  She will have repeat labs in 4 weeks and follow-up.    2.  Anemia.  Hemoglobin is decreased at 8.3.  She has previously been transfused in November 2018.  She has evidence of microcytosis.  Ferritin is 83.  Due to evidence of anemia that is not improving, she will be given a trial of IV iron with Injectafer 750 mg IV D1 and D8.  She denies any blood in her stool or urine.  Will check HB electrophoresis, and Thalassemia genotype evaluation in 4 weeks with repeat CBC and ferritin after IV iron.  IF Hb remains decreased, she will be set up for bone marrow biopsy for further evaluation due to persistent anemia.    3.  Fatigue and SOB.  Pt is anemic with HB of 83.  She will be given a trial of IV iron to determine if symptoms improve.  Pulse ox is 98% on 2-4 Liter O2.    4.  Right toe amputation.  Pt should continue to follow-up with surgery as directed.   5.  Atrial fibrillation.  Patient is on Coumadin.  Recent INR done 03/16/2018 was 2.4.   Follow-up with PCP for monitoring.    6.  Hypertension.  Blood pressure is 113/65. Follow-up with PCP.  Greater than 30 minutes spent with more than 50% spent in counseling and coordination of care.    Interval history.  Historical data obtained from note dated 11/11/2017.  Patient was previously followed by Dr. Talbert [name]  for Jak 2+ myeloproliferative disorder.  Patient was previously seen by vascular surgery and was diagnosed with dry gangrene and they say that her bypass graft in her right lower extremity was patent.  They recommended outpatient follow-up for her dry gangrene wounds.  She had a right lower extremity venous ultrasound performed which was negative for DVT on 06/10/2017.  Her hemoglobin dropped down to 7.2 g/dL while inpatient, and she was given 2 units PRBC transfusion in 06/2017.  She continues to take Charlotte [name].    Current Status: Patient is seen today for follow-up.  She is here to go over labs.  She reports she will see surgery this week for follow-up after toe amputation.    Problem List Patient Active Problem List   Diagnosis Date Noted  . Iron deficiency anemia due to chronic blood loss [D50.0] 03/18/2018  . Group B streptococcal infection [A49.1] 12/15/2017  . Open wound of right foot [S91.301A] 12/15/2017  . Right groin wound [S31.109A] 12/11/2017  . CKD (chronic kidney disease) stage 3, GFR 30-59 ml/min (HCC) [N18.3]   . Foot pain, right [M79.671]   .  Cellulitis and abscess of right leg [L03.115, L02.415] 06/27/2017  . 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 Hypokalemia [E87.6] 06/27/2017  . Osteomyelitis of right foot ([place]) [M86.9] 06/27/2017  . S/Charlotte femoral-popliteal bypass surgery [Z95.828]   . Edema of right lower extremity [R60.0] 06/08/2017  . GERD (gastroesophageal reflux disease) [K21.9] 06/08/2017  . Pressure ulcer [L89.90] 05/29/2017  . Sepsis ([place]) [A41.9] 05/28/2017  . Hypotension [I95.9] 05/28/2017  . Pressure injury of skin [L89.90] 03/14/2017  . Acute renal failure (ARF) ([place]) [N17.9]   .  Hyperlipidemia [E78.5] 03/05/2017  . Lower extremity arterial insufficiency, severe, right ([place]) [I73.9] 03/04/2017  . Chronic respiratory failure with hypoxia ([place]) [J96.11] 02/12/2017  . Cellulitis [L03.90] 07/03/2016  . Anemia [D64.9] 07/03/2016  . Cough [R05] 11/29/2015  . Hypoxemia [R09.02] 11/29/2015  . CHF (congestive heart failure) ([place]) [I50.9] 11/29/2015  . Acute on chronic respiratory failure with hypoxia (HCC) [J96.21]   . Pleural effusion on left [J90]   . Acute on chronic diastolic (congestive) heart failure ([place]) [I50.33]   . Dyspnea [R06.00] 11/22/2014  . Pleural effusion [J90] 11/22/2014  . Hard of hearing [H91.90] 11/22/2014  . Thrombocytosis ([place]) [D47.3] 11/22/2014  . Acute respiratory failure with hypoxemia ([place]) [J96.01] 11/22/2014  . Encounter for therapeutic drug monitoring [Z51.81] 08/31/2013  . Aftercare following surgery of the circulatory system, [place] [Z48.812] 12/21/2012  . Pain in limb [M79.609] 10/19/2012  . PVD (peripheral vascular disease) ([place]) [I73.9] 09/24/2012  . Swollen leg [M79.89] 09/24/2012  . 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 Wound infection after surgery [T81.49XA] 01/07/2012  . Compartment syndrome, nontraumatic, lower extremity [M79.A29] 11/29/2011  . Pain of right thigh [M79.651] 11/28/2011  . Hip fracture, right ([place]) [S72.001A] 11/15/2011  . Chronic atrial fibrillation ([place]) [I48.2] 11/15/2011  . Anticoagulant long-term use [Z79.01] 11/15/2011  . HTN (hypertension) [I10] 11/15/2011  . Hypothyroidism [E03.9] 11/15/2011  . Charlotte [name] ([place]) [D45] 10/01/2011    Past Medical History Past Medical History:  Diagnosis Date  . Anxiety   . Arthritis   . Atrial fibrillation, chronic (HCC)    a. on Coumadin for anticoagulation.   .  Cellulitis 06/29/2017   RIGHT LOWER LEG  . CHF (congestive heart failure) ([place])    combined  . COPD (chronic obstructive pulmonary disease) ([place])    on home O2 4 lpm  . Deaf   . Dry gangrene ([place])    R foot  . DVT (deep venous thrombosis) ([place])   . Essential hypertension   . GERD (gastroesophageal reflux disease)   . HOH (hard of hearing)   . Hyperlipidemia   . Hypothyroidism   . Iron deficiency anemia due to chronic blood loss 03/18/2018  . Open wound    right groin  . Oxygen dependent    4 lpm  . Peripheral neuropathy   . Peripheral vascular disease ([place])   . Charlotte [name](238.4) 10/01/2011  . Ulcer of ankle ([place])    Bilateral 2015  . Varicose veins     Past Surgical History Past Surgical History:  Procedure Laterality Date  . ABDOMINAL AORTAGRAM N/A 09/14/2012   Procedure: ABDOMINAL [name];  Surgeon: Serafina Mitchell, MD;  [facility] CATH LAB;  Service: Cardiovascular;  Laterality: N/A;  . ABDOMINAL AORTOGRAM N/A 03/06/2017   Procedure: ABDOMINAL AORTOGRAM;  Surgeon: Elam Dutch, MD;  Location: [facility]  CV LAB;  Service: Cardiovascular;  Laterality: N/A;  . ABDOMINAL AORTOGRAM W/LOWER EXTREMITY N/A 09/21/2017   Procedure: ABDOMINAL AORTOGRAM W/LOWER EXTREMITY;  Surgeon: Waynetta Sandy, MD;  Location: [facility] LAB;  Service: Cardiovascular;  Laterality: N/A;  . ABDOMINAL HYSTERECTOMY    . AMPUTATION Right 09/29/2017   Procedure: DEBRIDEMENT TOE AMPUTATION RIGHT FIFTH TOE;  Surgeon: Angelia Mould, MD;  [facility];  Service: Vascular;  Laterality: Right;  . APPLICATION OF WOUND VAC Right 09/29/2017   Procedure: APPLICATION OF WOUND VAC ON RIGHT FOOT;  Surgeon: Angelia Mould, MD;  Location: [facility];  Service: Vascular;  Laterality: Right;  . CATARACT EXTRACTION W/PHACO Right 08/31/2017   Procedure: CATARACT EXTRACTION PHACO AND INTRAOCULAR LENS PLACEMENT RIGHT EYE;  Surgeon: Tonny Branch, MD;  Location: [facility];  Service:  Ophthalmology;  Laterality: Right;  CDE: 13.27  . CATARACT EXTRACTION W/PHACO Left 09/14/2017   Procedure: CATARACT EXTRACTION PHACO AND INTRAOCULAR LENS PLACEMENT LEFT EYE;  Surgeon: Tonny Branch, MD;  Location: [facility];  Service: Ophthalmology;  Laterality: Left;  CDE: 13.02  . CORONARY ANGIOPLASTY    . DRESSING CHANGE UNDER ANESTHESIA  12/02/2011   Procedure: DRESSING CHANGE UNDER ANESTHESIA;  Surgeon: Carole Civil, MD;  Location: [facility];  Service: Orthopedics;  Laterality: Right;  . ENDARTERECTOMY FEMORAL Right 09/15/2012   Procedure: ENDARTERECTOMY FEMORAL;  Surgeon: Mal Misty, MD;  Location: [facility];  Service: Vascular;  Laterality: Right;  . FASCIOTOMY  11/29/2011   Procedure: FASCIOTOMY;  Surgeon: Carole Civil, MD;  [facility];  Service: Orthopedics;  Laterality: Right;  right thigh   . FEMORAL-POPLITEAL BYPASS GRAFT Right 09/15/2012   Procedure: BYPASS GRAFT FEMORAL-POPLITEAL ARTERY;  Surgeon: Mal Misty, MD;  Location: [facility];  Service: Vascular;  Laterality: Right;  . FEMORAL-TIBIAL BYPASS GRAFT Right 03/11/2017   Procedure: RIGHT FEMORAL-TIBIAL BYPASS IN-SITU GREATER SAPHENOUS VEIN;  Surgeon: Waynetta Sandy, MD;  Location: [facility];  Service: Vascular;  Laterality: Right;  . GROIN DEBRIDEMENT Right 12/10/2017   Procedure: GROIN DEBRIDEMENT EXPLORATION;  Surgeon: Waynetta Sandy, MD;  Location: [facility];  Service: Vascular;  Laterality: Right;  . HIP PINNING,CANNULATED  11/19/2011   Procedure: CANNULATED HIP PINNING;  Surgeon: Sanjuana Kava, MD;  [facility];  Service: Orthopedics;  Laterality: Right;  . LOWER EXTREMITY ANGIOGRAPHY Bilateral 03/06/2017   Procedure: Lower Extremity Angiography;  Surgeon: Elam Dutch, MD;  Location: [facility] CV LAB;  Service: Cardiovascular;  Laterality: Bilateral;  . PATCH ANGIOPLASTY Right 09/15/2012   Procedure: PATCH ANGIOPLASTY;  Surgeon: Mal Misty, MD;  Location: [facility];  Service: Vascular;  Laterality:  Right;  . TUBAL LIGATION      Family History Family History  Problem Relation Age of Onset  . Heart failure Mother   . Heart disease Mother   . Stroke Father   . Heart disease Sister   . Diabetes Son   . Hypertension Sister   . Hypertension Sister   . Stroke Sister      Social History  reports that she has never smoked. She has never used smokeless tobacco. She reports that she does not drink alcohol or use drugs.  Medications  Current Outpatient Medications:  .  aspirin EC 81 MG EC tablet, Take 1 tablet (81 mg total) by mouth daily., Disp: , Rfl:  .  diltiazem (CARDIZEM CD) 240 MG 24 hr capsule, Take 240 mg by mouth daily., Disp: , Rfl:  .  gabapentin (NEURONTIN)  100 MG capsule, Take 100 mg by mouth 3 (three) times daily., Disp: , Rfl: 2 .  hydroxyurea (Charlotte [name]) 500 MG capsule, Take 1 capsule (500 mg total) by mouth daily. May take with food to minimize GI side effects., Disp: 90 capsule, Rfl: 1 .  levothyroxine (SYNTHROID, LEVOTHROID) 125 MCG tablet, Take 125 mcg by mouth daily before breakfast. , Disp: , Rfl: 1 .  pravastatin (PRAVACHOL) 20 MG tablet, Take 10 mg by mouth daily., Disp: , Rfl:  .  torsemide (DEMADEX) 20 MG tablet, TAKE 1 TABLET BY MOUTH ONCE DAILY AS NEEDED MAY TAKE FOR WEIGHT GAIN OF 3 LBS OVER 24 HOURS, Disp: , Rfl: 11 .  traMADol (ULTRAM) 50 MG tablet, Take 1 tablet (50 mg total) by mouth every 6 (six) hours as needed., Disp: 20 tablet, Rfl: 0 .  warfarin (COUMADIN) 5 MG tablet, Take 0.5-1 tablets (2.5-5 mg total) by mouth See admin instructions. 5 mg on Monday, all other days 2.5 mg, Disp: , Rfl:  .  torsemide (DEMADEX) 20 MG tablet, Take 1 tablet (20 mg total) by mouth daily as needed. May take for Weight gain of 3 lbs over 24 hours, Disp: 30 tablet, Rfl: 11  Allergies Patient has no known allergies.  Review of Systems Review of Systems - Oncology ROS negative other than weakness and SOB.     Physical Exam  Vitals Wt Readings from Last 3 Encounters:   03/18/18 142 lb 8 oz (64.6 kg)  03/01/18 145 lb (65.8 kg)  01/08/18 149 lb (67.6 kg)   Temp Readings from Last 3 Encounters:  03/18/18 97.8 F (36.6 C) (Oral)  02/19/18 (!) 96.9 F (36.1 C) (Oral)  01/08/18 98.1 F (36.7 C) (Oral)   BP Readings from Last 3 Encounters:  03/18/18 113/65  03/01/18 124/68  02/19/18 107/72   Pulse Readings from Last 3 Encounters:  03/18/18 99  03/01/18 70  02/19/18 95   Constitutional: Well-developed, well-nourished, and in no distress.   HENT: Head: Normocephalic and atraumatic.  Mouth/Throat: No oropharyngeal exudate. Mucosa moist. Eyes: Pupils are equal, round, and reactive to light. Conjunctivae are normal. No scleral icterus.  Neck: Normal range of motion. Neck supple. No JVD present.  Cardiovascular: Normal rate, regular rhythm and normal heart sounds.  Exam reveals no gallop and no friction rub.   No murmur heard. Pulmonary/Chest: Effort normal and breath sounds normal. No respiratory distress. No wheezes.No rales.  Abdominal: Soft. Bowel sounds are normal. No distension. There is no tenderness. There is no guarding.  Musculoskeletal: evidence of right 5th toe amputation.  PV DZ findings in LE.   Lymphadenopathy: No cervical, axillary or supraclavicular adenopathy.  Neurological: Alert and oriented to person, place, and time. No cranial nerve deficit.  Skin: Skin is warm and dry. No rash noted. No erythema. No pallor.  Psychiatric: Affect and judgment normal.   Labs Anti-coag visit on 03/16/2018  Component Date Value Ref Range Status  . INR 03/16/2018 2.4  2.0 - 3.0 Final     Pathology Orders Placed This Encounter  Procedures  . CBC with Differential/Platelet    Standing Status:   Future    Standing Expiration Date:   03/19/2019  . Comprehensive metabolic panel    Standing Status:   Future    Standing Expiration Date:   03/19/2019  . Lactate dehydrogenase    Standing Status:   Future    Standing Expiration Date:   03/19/2019  .  Ferritin    Standing Status:   Future  Standing Expiration Date:   03/19/2019  . Hemoglobinopathy evaluation    Standing Status:   Future    Standing Expiration Date:   03/19/2019  . Vitamin B12    Standing Status:   Future    Standing Expiration Date:   03/19/2019  . Folate    Standing Status:   Future    Standing Expiration Date:   03/19/2019  . Methylmalonic acid, serum    Standing Status:   Future    Standing Expiration Date:   03/19/2019  . Alpha-Thalassemia GenotypR    Standing Status:   Future    Standing Expiration Date:   03/19/2019       Zoila Shutter MD

## 2018-03-19 ENCOUNTER — Ambulatory Visit (INDEPENDENT_AMBULATORY_CARE_PROVIDER_SITE_OTHER): Payer: Medicare Other | Admitting: Family

## 2018-03-19 ENCOUNTER — Other Ambulatory Visit: Payer: Self-pay

## 2018-03-19 ENCOUNTER — Ambulatory Visit (INDEPENDENT_AMBULATORY_CARE_PROVIDER_SITE_OTHER)
Admission: RE | Admit: 2018-03-19 | Discharge: 2018-03-19 | Disposition: A | Discharge status: Home / Self Care | Payer: Medicare Other | Source: Ambulatory Visit | Attending: Family | Admitting: Family

## 2018-03-19 ENCOUNTER — Ambulatory Visit (HOSPITAL_COMMUNITY)
Admission: RE | Admit: 2018-03-19 | Discharge: 2018-03-19 | Disposition: A | Discharge status: Home / Self Care | Payer: Medicare Other | Source: Ambulatory Visit | Attending: Family | Admitting: Family

## 2018-03-19 ENCOUNTER — Encounter: Payer: Self-pay | Admitting: Family

## 2018-03-19 VITALS — BP 127/80 | HR 79 | Temp 97.0°F | Resp 16 | Ht 72.0 in | Wt 142.5 lb

## 2018-03-19 DIAGNOSIS — I779 Disorder of arteries and arterioles, unspecified: Secondary | ICD-10-CM

## 2018-03-19 DIAGNOSIS — I10 Essential (primary) hypertension: Secondary | ICD-10-CM | POA: Diagnosis not present

## 2018-03-19 DIAGNOSIS — I739 Peripheral vascular disease, unspecified: Secondary | ICD-10-CM

## 2018-03-19 DIAGNOSIS — S90414A Abrasion, right lesser toe(s), initial encounter: Secondary | ICD-10-CM | POA: Diagnosis not present

## 2018-03-19 DIAGNOSIS — D45 Polycythemia vera: Secondary | ICD-10-CM | POA: Diagnosis not present

## 2018-03-19 DIAGNOSIS — Z7901 Long term (current) use of anticoagulants: Secondary | ICD-10-CM

## 2018-03-19 DIAGNOSIS — Z95828 Presence of other vascular implants and grafts: Secondary | ICD-10-CM

## 2018-03-19 MED ORDER — CEPHALEXIN 500 MG PO CAPS: 500.0000 mg | ORAL_CAPSULE | Freq: Three times a day (TID) | ORAL | 0 refills | Status: DC

## 2018-03-19 NOTE — Progress Notes (Signed)
[facility] [place]   CC: Follow up peripheral artery occlusive disease  History of Present Illness Charlotte [name] is a 77 y.o. female who is s/p debridement of right groin to 7 x 1.5 x 2cm deep, placement of negative pressure dressing on 12-10-17 by Dr. [name] for right groin draining sinus tract.  She is also s/p ray amputation of right fifth toe and placement of VACon 09-29-17 by Dr. Scot Dock for nonhealing wound right fifth toe.  She has had several vascular procedures on her lower extremities, includingRight femoral endarterectomy with Dacron patch angioplasty and profundoplastyand right femoral popliteal bypass graft using 6 mm Gore-Tex (above-knee)on 09-15-12 by Dr. Kellie Simmering.    Dr. [name] last evaluated pt on 01-08-18. At that time right groin with fibrinous tissue Strong peroneal and DP signals and a palpable pulse on the right medial thigh. Dr. [name] placed silver nitrate on the right groin with a dry dressing. We can move away from wet to dries at this time and hopefully this will heal. There was underlying graft to the proximal anastomosis and this wound. The right foot appeared to be healing well with dry dressings as well. Pt was to follow-up in 4 to 6 weeks. She completed antibiotics. If the wounds do not progress we can certainly see her sooner.  She returns today for follow up.   Her PCP prescribed cephalexin which she finished yesterday to address right 4th toe wound that pt states is likely caused by her right foot rolling outward as she walks, wearing the post op shoe.  Pt husband states HH advised a brace to correct outward roll, pt is not seeing a posdiatrist, will refer to one in [place] to assist with prescribing the best type of corrective brace or footwear for this.    HH is still visiting pt.   Review of Systems Shortness of breath with activity She has COPD and CHF, has supplemental O2 via [place].   Wound in right groin has healed and  right lateral foot site of small toe amputation is almost healed.  New wound right 4th toe from recurrent abrasion inside shoe when foot rolls.  She has polycythemia vera.     Diabetic: No Tobacco use: non-smoker  Pt meds include: Statin :Yes Betablocker: No ASA: Yes Other anticoagulants/antiplatelets:  coumadin for atrial fib.   Past Medical History:  Diagnosis Date  . Anxiety   . Arthritis   . Atrial fibrillation, chronic (HCC)    a. on Coumadin for anticoagulation.   . Cellulitis 06/29/2017   RIGHT LOWER LEG  . CHF (congestive heart failure) ([place])    combined  . COPD (chronic obstructive pulmonary disease) ([place])    on home O2 4 lpm  . Deaf   . 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 DVT (deep venous thrombosis) ([place])   . Essential hypertension   . GERD (gastroesophageal reflux disease)   . HOH (hard of hearing)   . Hyperlipidemia   . Hypothyroidism   . Iron deficiency anemia due to chronic blood loss 03/18/2018  . Open wound    right groin  . Oxygen dependent    4 lpm  . Peripheral neuropathy   . Peripheral vascular disease ([place])   . 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 Ulcer of ankle ([place])    Bilateral 2015  . Varicose veins     Social History Social History   Tobacco Use  . Smoking status: Never Smoker  . Smokeless tobacco:  Never Used  Substance Use Topics  . Alcohol use: No    Alcohol/week: 0.0 standard drinks  . Drug use: No    Family History Family History  Problem Relation Age of Onset  . Heart failure Mother   . Heart disease Mother   . Stroke Father   . Heart disease Sister   . Diabetes Son   . Hypertension Sister   . Hypertension Sister   . Stroke Sister     Past Surgical History:  Procedure Laterality Date  . ABDOMINAL AORTAGRAM N/A 09/14/2012   Procedure: ABDOMINAL [name];  Surgeon: Serafina Mitchell, MD;  Location: [facility] CATH LAB;  Service: Cardiovascular;  Laterality: N/A;  . ABDOMINAL AORTOGRAM N/A 03/06/2017   Procedure: ABDOMINAL  AORTOGRAM;  Surgeon: Elam Dutch, MD;  Location: [place] CV LAB;  Service: Cardiovascular;  Laterality: N/A;  . ABDOMINAL AORTOGRAM W/LOWER EXTREMITY N/A 09/21/2017   Procedure: ABDOMINAL AORTOGRAM W/LOWER EXTREMITY;  Surgeon: Waynetta Sandy, MD;  Location: [facility] CV LAB;  Service: Cardiovascular;  Laterality: N/A;  . ABDOMINAL HYSTERECTOMY    . AMPUTATION Right 09/29/2017   Procedure: DEBRIDEMENT TOE AMPUTATION RIGHT FIFTH TOE;  Surgeon: Angelia Mould, MD;  Location: [place];  Service: Vascular;  Laterality: Right;  . APPLICATION OF WOUND VAC Right 09/29/2017   Procedure: APPLICATION OF WOUND VAC ON RIGHT FOOT;  Surgeon: Angelia Mould, MD;  Location: [place];  Service: Vascular;  Laterality: Right;  . CATARACT EXTRACTION W/PHACO Right 08/31/2017   Procedure: CATARACT EXTRACTION PHACO AND INTRAOCULAR LENS PLACEMENT RIGHT EYE;  Surgeon: Tonny Branch, MD;  [facility];  Service: Ophthalmology;  Laterality: Right;  CDE: 13.27  . CATARACT EXTRACTION W/PHACO Left 09/14/2017   Procedure: CATARACT EXTRACTION PHACO AND INTRAOCULAR LENS PLACEMENT LEFT EYE;  Surgeon: Tonny Branch, MD;  Location: [facility];  Service: Ophthalmology;  Laterality: Left;  CDE: 13.02  . CORONARY ANGIOPLASTY    . DRESSING CHANGE UNDER ANESTHESIA  12/02/2011   Procedure: DRESSING CHANGE UNDER ANESTHESIA;  Surgeon: Carole Civil, MD;  [facility]: [facility];  Service: Orthopedics;  Laterality: Right;  . ENDARTERECTOMY FEMORAL Right 09/15/2012   Procedure: ENDARTERECTOMY FEMORAL;  Surgeon: Mal Misty, MD;  Location: [place];  Service: Vascular;  Laterality: Right;  . FASCIOTOMY  11/29/2011   Procedure: FASCIOTOMY;  Surgeon: Carole Civil, MD;  [facility];  Service: Orthopedics;  Laterality: Right;  right thigh   . FEMORAL-POPLITEAL BYPASS GRAFT Right 09/15/2012   Procedure: BYPASS GRAFT FEMORAL-POPLITEAL ARTERY;  Surgeon: Mal Misty, MD;  Location: [place];  Service: Vascular;   Laterality: Right;  . FEMORAL-TIBIAL BYPASS GRAFT Right 03/11/2017   Procedure: RIGHT FEMORAL-TIBIAL BYPASS IN-SITU GREATER SAPHENOUS VEIN;  Surgeon: Waynetta Sandy, MD;  Location: [place];  Service: Vascular;  Laterality: Right;  . GROIN DEBRIDEMENT Right 12/10/2017   Procedure: GROIN DEBRIDEMENT EXPLORATION;  Surgeon: Waynetta Sandy, MD;  Location: [place];  Service: Vascular;  Laterality: Right;  . HIP PINNING,CANNULATED  11/19/2011   Procedure: CANNULATED HIP PINNING;  Surgeon: Sanjuana Kava, MD;  [facility];  Service: Orthopedics;  Laterality: Right;  . LOWER EXTREMITY ANGIOGRAPHY Bilateral 03/06/2017   Procedure: Lower Extremity Angiography;  Surgeon: Elam Dutch, MD;  Location: [place] [facility];  Service: Cardiovascular;  Laterality: Bilateral;  . PATCH ANGIOPLASTY Right 09/15/2012   Procedure: PATCH ANGIOPLASTY;  Surgeon: Mal Misty, MD;  Location: [place];  Service: Vascular;  Laterality: Right;  . TUBAL LIGATION  No Known Allergies  Current Outpatient Medications  Medication Sig Dispense Refill  . aspirin EC 81 MG EC tablet Take 1 tablet (81 mg total) by mouth daily.    Marland Kitchen diltiazem (CARDIZEM CD) 240 MG 24 hr capsule Take 240 mg by mouth daily.    Marland Kitchen gabapentin (NEURONTIN) 100 MG capsule Take 100 mg by mouth 3 (three) times daily.  2  . hydroxyurea (HYDREA) 500 MG capsule Take 1 capsule (500 mg total) by mouth daily. May take with food to minimize GI side effects. 90 capsule 1  . levothyroxine (SYNTHROID, LEVOTHROID) 125 MCG tablet Take 125 mcg by mouth daily before breakfast.   1  . pravastatin (PRAVACHOL) 20 MG tablet Take 10 mg by mouth daily.    Marland Kitchen torsemide (DEMADEX) 20 MG tablet TAKE 1 TABLET BY MOUTH ONCE DAILY AS NEEDED MAY TAKE FOR WEIGHT GAIN OF 3 LBS OVER 24 HOURS  11  . traMADol (ULTRAM) 50 MG tablet Take 1 tablet (50 mg total) by mouth every 6 (six) hours as needed. 20 tablet 0  . warfarin (COUMADIN) 5 MG tablet Take 0.5-1 tablets (2.5-5 mg  total) by mouth See admin instructions. 5 mg on Monday, all other days 2.5 mg     No current facility-administered medications for this visit.     ROS: See HPI for pertinent positives and negatives.   Physical Examination  Vitals:   03/19/18 1110 03/19/18 1115  BP: 124/78 127/80  Pulse: 79   Resp: 16   Temp: (!) 97 F (36.1 C)   TempSrc: Oral   SpO2: 97%   Weight: 142 lb 8 oz (64.6 kg)   Height: 6' (1.829 m)    Body mass index is 19.33 kg/m.    General: A&O x 3, WDWN, thin frail appearing female.    Gait: in w/c, then exam table, not observed HENT: No gross abnormalities.  Eyes: PERRLA. Pulmonary: Respirations are non labored, Respirations are non-labored, supplemental O2 at 2L via [place]  Cardiac: Irregular rhythm, controlled rate      Carotid Bruits Right Left   Negative Negative   Radial pulses are 2+ palpable bilaterally   Adominal aortic pulse is not palpable             Right foot, 1 cm x 1.25 cm wound on 4th toe    Right foot , lateral view, healing right 5th toe ray amputation site, contracting wound                VASCULAR EXAM: Extremities without ischemic changes, without Gangrene; with open wound right 4th toe from apparent abrasion of toe inside post op sjoe when foot rolls, see above photo. Contracting and healing right 5th toe ray amputation site.                                                                                                           LE Pulses Right Left       FEMORAL  2+ palpable  2+ palpable        POPLITEAL  not palpable   not palpable       POSTERIOR TIBIAL  not palpable   not palpable        DORSALIS PEDIS      ANTERIOR TIBIAL not palpable  not palpable    Abdomen: soft, NT, no palpable masses. Skin: no rashes,  Hemosiderin deposits both lower legs in a gaiter pattern. See Extremities Musculoskeletal: no muscle wasting or atrophy.  Neurologic: A&O X 3; appropriate affect, Sensation is normal; MOTOR FUNCTION:  moving  all extremities equally, motor strength 4/5 throughout. Speech is fluent/normal. CN 2-12 intact except is hard of hearing Psychiatric: Thought content is normal, mood appropriate for clinical situation.     ASSESSMENT: RAYAN DYAL is a 77 y.o. female who is s/p debridement of right groin to 7 x 1.5 x 2cm deep, placement of negative pressure dressing on 12-10-17 by Dr. [name] for right groin draining sinus tract.  She is also s/p ray amputation of right fifth toe and placement of VACon 09-29-17 by Dr. Scot Dock for nonhealing wound right fifth toe. Wound vac has not been needed for a while.   She has had several vascular procedures on her lower extremities, includingRight femoral endarterectomy with Dacron patch angioplasty and profundoplastyand right femoral popliteal bypass graft using 6 mm Gore-Tex (above-knee)on 09-15-12 by Dr. Kellie Simmering.    Dr. [name] spoke with pt and husband and examined pt.  Reordered Keflex 500 mg po tid x 7 days, disp#21, 0 refills as prophylaxis to infection due to recurrent abrading of right 4th toe form foot rolling and rubbing on inside of post op shoe.  Referred to podiatrist in [place] ASAP for wound care of right 4th toe wound from her shoe and foot rolling, abrading on her post op shoe when she walks, and to order the proper orthotic or brace to correct this. Appointment made for 03-22-18.   Fortunately [facility] is already present, helping with wound care; continue to ensure daily dressing change with padding to protect right foot from further injury as best as possible until pt is seen by podiatrist who will then manage and prescribe wound care.   Return in 3 months for AB's and right LE arterial duplex.    The patient's right 5th toe amputation site and right groin wounds are healing well, see above photos.   Pain in right foot seems to be well controlled.   DATA  Right LE Arterial Duplex (03-19-18): Femora-tibial-peroneal bypass graft with no  stenosis, all tri and biphasic waveforms  ABI (Date: 03/19/2018):  R:   ABI: 1.98, [place] (was 0.67 [place] on 07-17-17),   PT: bi (was mono)  DP: bi (was mono)  TBI:  0.97, toe pressure 124, dampened, calcified? (was 0.41)  L:   ABI: 1.97, [place] (was [place]),   PT: tri  DP: tri  TBI: 1.84, toe pressure 235, dampened, calcified? (was 0.66) Bilateral ABI are unreliable due to calcified vessels. Improved waveform quality in right LE.  Left ankle waveforms remain triphasic.  Toe pressures indicate calcified vessels.     PLAN:  Reordered Keflex 500 mg po tid x 7 days, disp#21, 0 refills as prophylaxis to infection due to recurrent abrading of right 4th toe form foot rolling and rubbing on inside of post op shoe.  Referred to podiatrist in [place] ASAP for wound care of right 4th toe wound from her shoe and foot rolling, abrading on her post op shoe when she walks, and to order the proper orthotic or brace  to correct this. Appointment made for 03-22-18.    Based on the patient's vascular studies and examination, pt will return to clinic in 3 months for AB's and right LE arterial duplex.   I discussed in depth with the patient the nature of atherosclerosis, and emphasized the importance of maximal medical management including strict control of blood pressure, blood glucose, and lipid levels, obtaining regular exercise, and continued cessation of smoking.  The patient is aware that without maximal medical management the underlying atherosclerotic disease process will progress, limiting the benefit of any interventions.  The patient was given information about PAD including signs, symptoms, treatment, what symptoms should prompt the patient to seek immediate medical care, and risk reduction measures to take.  Clemon Chambers, RN, MSN, FNP-C [facility] [employer] Phone: [phone]  Clinic MD: [name]  03/19/18 11:18 AM

## 2018-03-19 NOTE — Patient Instructions (Signed)

## 2018-03-20 DIAGNOSIS — I110 Hypertensive heart disease with heart failure: Secondary | ICD-10-CM | POA: Diagnosis not present

## 2018-03-20 DIAGNOSIS — L02214 Cutaneous abscess of groin: Secondary | ICD-10-CM | POA: Diagnosis not present

## 2018-03-20 DIAGNOSIS — I5042 Chronic combined systolic (congestive) and diastolic (congestive) heart failure: Secondary | ICD-10-CM | POA: Diagnosis not present

## 2018-03-20 DIAGNOSIS — J449 Chronic obstructive pulmonary disease, unspecified: Secondary | ICD-10-CM | POA: Diagnosis not present

## 2018-03-20 DIAGNOSIS — T8743 Infection of amputation stump, right lower extremity: Secondary | ICD-10-CM | POA: Diagnosis not present

## 2018-03-20 DIAGNOSIS — B9562 Methicillin resistant Staphylococcus aureus infection as the cause of diseases classified elsewhere: Secondary | ICD-10-CM | POA: Diagnosis not present

## 2018-03-22 ENCOUNTER — Other Ambulatory Visit: Payer: Self-pay

## 2018-03-22 ENCOUNTER — Ambulatory Visit (HOSPITAL_COMMUNITY): Payer: Medicare Other

## 2018-03-22 DIAGNOSIS — L97509 Non-pressure chronic ulcer of other part of unspecified foot with unspecified severity: Secondary | ICD-10-CM | POA: Diagnosis not present

## 2018-03-22 DIAGNOSIS — I739 Peripheral vascular disease, unspecified: Secondary | ICD-10-CM

## 2018-03-22 DIAGNOSIS — M21541 Acquired clubfoot, right foot: Secondary | ICD-10-CM | POA: Diagnosis not present

## 2018-03-22 DIAGNOSIS — I779 Disorder of arteries and arterioles, unspecified: Secondary | ICD-10-CM

## 2018-03-23 ENCOUNTER — Inpatient Hospital Stay (HOSPITAL_COMMUNITY): Payer: Medicare Other

## 2018-03-23 VITALS — BP 123/60 | HR 74 | Temp 97.5°F | Resp 18

## 2018-03-23 DIAGNOSIS — D500 Iron deficiency anemia secondary to blood loss (chronic): Secondary | ICD-10-CM

## 2018-03-23 DIAGNOSIS — R5383 Other fatigue: Secondary | ICD-10-CM | POA: Diagnosis not present

## 2018-03-23 DIAGNOSIS — I130 Hypertensive heart and chronic kidney disease with heart failure and stage 1 through stage 4 chronic kidney disease, or unspecified chronic kidney disease: Secondary | ICD-10-CM | POA: Diagnosis not present

## 2018-03-23 DIAGNOSIS — D45 Polycythemia vera: Secondary | ICD-10-CM | POA: Diagnosis not present

## 2018-03-23 DIAGNOSIS — I5033 Acute on chronic diastolic (congestive) heart failure: Secondary | ICD-10-CM | POA: Diagnosis not present

## 2018-03-23 DIAGNOSIS — R0602 Shortness of breath: Secondary | ICD-10-CM | POA: Diagnosis not present

## 2018-03-23 MED ORDER — SODIUM CHLORIDE 0.9 % IV SOLN
Freq: Once | INTRAVENOUS | Status: AC
  Administered 2018-03-23: 14:00:00 via INTRAVENOUS

## 2018-03-23 MED ORDER — SODIUM CHLORIDE 0.9 % IV SOLN
750.0000 mg | Freq: Once | INTRAVENOUS | Status: AC
  Administered 2018-03-23: 750 mg via INTRAVENOUS
  Filled 2018-03-23: qty 15

## 2018-03-23 NOTE — Progress Notes (Signed)
Patient tolerated iron infusion with no complaints voiced.  Good blood return noted before and after administration of iron.  Peripheral IV site clean and dry with no bruising or swelling noted at site.  Band aid applied.  Patient left by wheelchair with family with no s/s of distress noted.

## 2018-03-23 NOTE — Patient Instructions (Signed)
[facility]  Discharge Instructions:  You received an iron infusion today.  _______________________________________________________________  Thank you for choosing [facility] at [facility] to provide your oncology and hematology care.  To afford each patient quality time with our providers, please arrive at least 15 minutes before your scheduled appointment.  You need to re-schedule your appointment if you arrive 10 or more minutes late.  We strive to give you quality time with our providers, and arriving late affects you and other patients whose appointments are after yours.  Also, if you no show three or more times for appointments you may be dismissed from the clinic.  Again, thank you for choosing [facility] at [facility]. Our hope is that these requests will allow you access to exceptional care and in a timely manner. _______________________________________________________________  If you have questions after your visit, please contact our office at [phone] between the hours of 8:30 a.m. and 5:00 p.m. Voicemails left after 4:30 p.m. will not be returned until the following business day. _______________________________________________________________  For prescription refill requests, have your pharmacy contact our office. _______________________________________________________________  Recommendations made by the consultant and any test results will be sent to your referring physician. _______________________________________________________________ 

## 2018-03-24 DIAGNOSIS — I5042 Chronic combined systolic (congestive) and diastolic (congestive) heart failure: Secondary | ICD-10-CM | POA: Diagnosis not present

## 2018-03-24 DIAGNOSIS — B9562 Methicillin resistant Staphylococcus aureus infection as the cause of diseases classified elsewhere: Secondary | ICD-10-CM | POA: Diagnosis not present

## 2018-03-24 DIAGNOSIS — L02214 Cutaneous abscess of groin: Secondary | ICD-10-CM | POA: Diagnosis not present

## 2018-03-24 DIAGNOSIS — I110 Hypertensive heart disease with heart failure: Secondary | ICD-10-CM | POA: Diagnosis not present

## 2018-03-24 DIAGNOSIS — T8743 Infection of amputation stump, right lower extremity: Secondary | ICD-10-CM | POA: Diagnosis not present

## 2018-03-24 DIAGNOSIS — J449 Chronic obstructive pulmonary disease, unspecified: Secondary | ICD-10-CM | POA: Diagnosis not present

## 2018-03-25 DIAGNOSIS — I5042 Chronic combined systolic (congestive) and diastolic (congestive) heart failure: Secondary | ICD-10-CM | POA: Diagnosis not present

## 2018-03-25 DIAGNOSIS — T8743 Infection of amputation stump, right lower extremity: Secondary | ICD-10-CM | POA: Diagnosis not present

## 2018-03-25 DIAGNOSIS — J449 Chronic obstructive pulmonary disease, unspecified: Secondary | ICD-10-CM | POA: Diagnosis not present

## 2018-03-25 DIAGNOSIS — I110 Hypertensive heart disease with heart failure: Secondary | ICD-10-CM | POA: Diagnosis not present

## 2018-03-25 DIAGNOSIS — B9562 Methicillin resistant Staphylococcus aureus infection as the cause of diseases classified elsewhere: Secondary | ICD-10-CM | POA: Diagnosis not present

## 2018-03-25 DIAGNOSIS — L02214 Cutaneous abscess of groin: Secondary | ICD-10-CM | POA: Diagnosis not present

## 2018-03-29 ENCOUNTER — Ambulatory Visit (HOSPITAL_COMMUNITY): Payer: Medicare Other

## 2018-03-29 ENCOUNTER — Ambulatory Visit (INDEPENDENT_AMBULATORY_CARE_PROVIDER_SITE_OTHER): Payer: Medicare Other | Admitting: *Deleted

## 2018-03-29 DIAGNOSIS — I482 Chronic atrial fibrillation, unspecified: Secondary | ICD-10-CM

## 2018-03-29 DIAGNOSIS — J449 Chronic obstructive pulmonary disease, unspecified: Secondary | ICD-10-CM | POA: Diagnosis not present

## 2018-03-29 DIAGNOSIS — Z5181 Encounter for therapeutic drug level monitoring: Secondary | ICD-10-CM

## 2018-03-29 DIAGNOSIS — I110 Hypertensive heart disease with heart failure: Secondary | ICD-10-CM | POA: Diagnosis not present

## 2018-03-29 DIAGNOSIS — I5042 Chronic combined systolic (congestive) and diastolic (congestive) heart failure: Secondary | ICD-10-CM | POA: Diagnosis not present

## 2018-03-29 DIAGNOSIS — B9562 Methicillin resistant Staphylococcus aureus infection as the cause of diseases classified elsewhere: Secondary | ICD-10-CM | POA: Diagnosis not present

## 2018-03-29 DIAGNOSIS — L02214 Cutaneous abscess of groin: Secondary | ICD-10-CM | POA: Diagnosis not present

## 2018-03-29 DIAGNOSIS — T8743 Infection of amputation stump, right lower extremity: Secondary | ICD-10-CM | POA: Diagnosis not present

## 2018-03-29 LAB — POCT INR: INR: 1.8 — AB (ref 2.0–3.0)

## 2018-03-29 NOTE — Patient Instructions (Signed)
Take coumadin 7.5mg  tonight then resume 2.5mg  daily except 5mg  on Mondays and Thursdays Recheck in 1 week Order given to [employer]

## 2018-03-30 ENCOUNTER — Encounter (HOSPITAL_COMMUNITY): Payer: Self-pay

## 2018-03-30 ENCOUNTER — Inpatient Hospital Stay (HOSPITAL_COMMUNITY): Payer: Medicare Other

## 2018-03-30 VITALS — BP 137/71 | HR 86 | Temp 97.5°F | Resp 18

## 2018-03-30 DIAGNOSIS — R0602 Shortness of breath: Secondary | ICD-10-CM | POA: Diagnosis not present

## 2018-03-30 DIAGNOSIS — D500 Iron deficiency anemia secondary to blood loss (chronic): Secondary | ICD-10-CM | POA: Diagnosis not present

## 2018-03-30 DIAGNOSIS — T8743 Infection of amputation stump, right lower extremity: Secondary | ICD-10-CM | POA: Diagnosis not present

## 2018-03-30 DIAGNOSIS — R5383 Other fatigue: Secondary | ICD-10-CM | POA: Diagnosis not present

## 2018-03-30 DIAGNOSIS — I110 Hypertensive heart disease with heart failure: Secondary | ICD-10-CM | POA: Diagnosis not present

## 2018-03-30 DIAGNOSIS — B9562 Methicillin resistant Staphylococcus aureus infection as the cause of diseases classified elsewhere: Secondary | ICD-10-CM | POA: Diagnosis not present

## 2018-03-30 DIAGNOSIS — D45 Polycythemia vera: Secondary | ICD-10-CM | POA: Diagnosis not present

## 2018-03-30 DIAGNOSIS — I5042 Chronic combined systolic (congestive) and diastolic (congestive) heart failure: Secondary | ICD-10-CM | POA: Diagnosis not present

## 2018-03-30 DIAGNOSIS — I5033 Acute on chronic diastolic (congestive) heart failure: Secondary | ICD-10-CM | POA: Diagnosis not present

## 2018-03-30 DIAGNOSIS — L02214 Cutaneous abscess of groin: Secondary | ICD-10-CM | POA: Diagnosis not present

## 2018-03-30 DIAGNOSIS — I130 Hypertensive heart and chronic kidney disease with heart failure and stage 1 through stage 4 chronic kidney disease, or unspecified chronic kidney disease: Secondary | ICD-10-CM | POA: Diagnosis not present

## 2018-03-30 DIAGNOSIS — J449 Chronic obstructive pulmonary disease, unspecified: Secondary | ICD-10-CM | POA: Diagnosis not present

## 2018-03-30 MED ORDER — SODIUM CHLORIDE 0.9 % IV SOLN
Freq: Once | INTRAVENOUS | Status: AC
  Administered 2018-03-30: 14:00:00 via INTRAVENOUS

## 2018-03-30 MED ORDER — SODIUM CHLORIDE 0.9 % IV SOLN
750.0000 mg | Freq: Once | INTRAVENOUS | Status: AC
  Administered 2018-03-30: 750 mg via INTRAVENOUS
  Filled 2018-03-30: qty 15

## 2018-03-30 NOTE — Patient Instructions (Signed)
[facility] Discharge Instructions  Received Injectafer infusion today. Follow-up as scheduled. Call clinic for any questions or concerns   Thank you for choosing  [facility] to provide your oncology and hematology care.  To afford each patient quality time with our provider, please arrive at least 15 minutes before your scheduled appointment time.   If you have a lab appointment with the Cancer Center please come in thru the  Main Entrance and check in at the main information desk  You need to re-schedule your appointment should you arrive 10 or more minutes late.  We strive to give you quality time with our providers, and arriving late affects you and other patients whose appointments are after yours.  Also, if you no show three or more times for appointments you may be dismissed from the clinic at the providers discretion.     Again, thank you for choosing [facility].  Our hope is that these requests will decrease the amount of time that you wait before being seen by our physicians.       _____________________________________________________________  Should you have questions after your visit to [facility], please contact our office at [phone] between the hours of 8:00 a.m. and 4:30 p.m.  Voicemails left after 4:00 p.m. will not be returned until the following business day.  For prescription refill requests, have your pharmacy contact our office and allow 72 hours.    Cancer Center Support Programs:   > Cancer Support Group  2nd Tuesday of the month 1pm-2pm, Journey Room   

## 2018-03-30 NOTE — Progress Notes (Signed)
Charlotte [name] tolerated Injectafer infusion well without incident or complaint. VSS upon completion of treatment. Discharged via wheelchair in satisfactory condition in company of family member.

## 2018-04-04 ENCOUNTER — Other Ambulatory Visit: Payer: Self-pay | Admitting: Cardiovascular Disease

## 2018-04-07 ENCOUNTER — Ambulatory Visit (INDEPENDENT_AMBULATORY_CARE_PROVIDER_SITE_OTHER): Payer: Medicare HMO | Admitting: *Deleted

## 2018-04-07 DIAGNOSIS — I482 Chronic atrial fibrillation, unspecified: Secondary | ICD-10-CM

## 2018-04-07 DIAGNOSIS — Z5181 Encounter for therapeutic drug level monitoring: Secondary | ICD-10-CM

## 2018-04-07 LAB — POCT INR: INR: 2.4 (ref 2.0–3.0)

## 2018-04-07 NOTE — Patient Instructions (Signed)
Continue coumadin 2.5mg  daily except 5mg  on Mondays and Thursdays Recheck in 1 week Order given to [employer]

## 2018-04-13 ENCOUNTER — Ambulatory Visit (INDEPENDENT_AMBULATORY_CARE_PROVIDER_SITE_OTHER): Payer: Medicare HMO | Admitting: *Deleted

## 2018-04-13 DIAGNOSIS — I482 Chronic atrial fibrillation, unspecified: Secondary | ICD-10-CM

## 2018-04-13 DIAGNOSIS — Z5181 Encounter for therapeutic drug level monitoring: Secondary | ICD-10-CM

## 2018-04-13 LAB — POCT INR: INR: 2.6 (ref 2.0–3.0)

## 2018-04-13 NOTE — Patient Instructions (Signed)
Continue coumadin 2.5mg  daily except 5mg  on Mondays and Thursdays Recheck in 2 weeks Order given to [employer]

## 2018-04-27 ENCOUNTER — Ambulatory Visit (INDEPENDENT_AMBULATORY_CARE_PROVIDER_SITE_OTHER): Payer: Medicare HMO | Admitting: *Deleted

## 2018-04-27 DIAGNOSIS — I482 Chronic atrial fibrillation, unspecified: Secondary | ICD-10-CM

## 2018-04-27 DIAGNOSIS — Z5181 Encounter for therapeutic drug level monitoring: Secondary | ICD-10-CM | POA: Diagnosis not present

## 2018-04-27 LAB — POCT INR: INR: 2.1 (ref 2.0–3.0)

## 2018-04-27 NOTE — Patient Instructions (Signed)
Continue coumadin 2.5mg  daily except 5mg  on Mondays and Thursdays Recheck in 2 weeks Order given to [employer]

## 2018-04-28 DIAGNOSIS — I5043 Acute on chronic combined systolic (congestive) and diastolic (congestive) heart failure: Secondary | ICD-10-CM | POA: Diagnosis not present

## 2018-04-28 DIAGNOSIS — I5040 Unspecified combined systolic (congestive) and diastolic (congestive) heart failure: Secondary | ICD-10-CM | POA: Diagnosis not present

## 2018-04-28 DIAGNOSIS — M6281 Muscle weakness (generalized): Secondary | ICD-10-CM | POA: Diagnosis not present

## 2018-04-30 ENCOUNTER — Telehealth: Payer: Self-pay | Admitting: *Deleted

## 2018-04-30 DIAGNOSIS — I5040 Unspecified combined systolic (congestive) and diastolic (congestive) heart failure: Secondary | ICD-10-CM | POA: Diagnosis not present

## 2018-04-30 NOTE — Telephone Encounter (Signed)
Pt's husband called and has a question for you

## 2018-04-30 NOTE — Telephone Encounter (Signed)
Husband states [facility] is discharging patient.  Needs to make f/u INR appt.  Appt made for 05/10/18.  Spouse verbalized understanding.

## 2018-05-01 ENCOUNTER — Other Ambulatory Visit (HOSPITAL_COMMUNITY): Payer: Self-pay | Admitting: Internal Medicine

## 2018-05-01 DIAGNOSIS — D45 Polycythemia vera: Secondary | ICD-10-CM

## 2018-05-05 ENCOUNTER — Other Ambulatory Visit (HOSPITAL_COMMUNITY): Payer: Self-pay | Admitting: *Deleted

## 2018-05-05 DIAGNOSIS — D45 Polycythemia vera: Secondary | ICD-10-CM

## 2018-05-05 MED ORDER — HYDROXYUREA 500 MG PO CAPS: 500.0000 mg | ORAL_CAPSULE | Freq: Every day | ORAL | 3 refills | Status: DC

## 2018-05-07 ENCOUNTER — Other Ambulatory Visit (HOSPITAL_COMMUNITY): Payer: Self-pay | Admitting: *Deleted

## 2018-05-07 DIAGNOSIS — D45 Polycythemia vera: Secondary | ICD-10-CM

## 2018-05-07 MED ORDER — HYDROXYUREA 500 MG PO CAPS: 1000.0000 mg | ORAL_CAPSULE | Freq: Every day | ORAL | 3 refills | Status: DC

## 2018-05-10 ENCOUNTER — Inpatient Hospital Stay (HOSPITAL_COMMUNITY): Payer: Medicare HMO | Attending: Hematology

## 2018-05-10 ENCOUNTER — Ambulatory Visit (INDEPENDENT_AMBULATORY_CARE_PROVIDER_SITE_OTHER): Payer: Medicare HMO | Admitting: *Deleted

## 2018-05-10 DIAGNOSIS — I482 Chronic atrial fibrillation, unspecified: Secondary | ICD-10-CM | POA: Insufficient documentation

## 2018-05-10 DIAGNOSIS — Z86718 Personal history of other venous thrombosis and embolism: Secondary | ICD-10-CM | POA: Insufficient documentation

## 2018-05-10 DIAGNOSIS — I130 Hypertensive heart and chronic kidney disease with heart failure and stage 1 through stage 4 chronic kidney disease, or unspecified chronic kidney disease: Secondary | ICD-10-CM | POA: Insufficient documentation

## 2018-05-10 DIAGNOSIS — Z7982 Long term (current) use of aspirin: Secondary | ICD-10-CM | POA: Insufficient documentation

## 2018-05-10 DIAGNOSIS — Z9981 Dependence on supplemental oxygen: Secondary | ICD-10-CM | POA: Insufficient documentation

## 2018-05-10 DIAGNOSIS — D45 Polycythemia vera: Secondary | ICD-10-CM | POA: Insufficient documentation

## 2018-05-10 DIAGNOSIS — Z7901 Long term (current) use of anticoagulants: Secondary | ICD-10-CM | POA: Diagnosis not present

## 2018-05-10 DIAGNOSIS — K219 Gastro-esophageal reflux disease without esophagitis: Secondary | ICD-10-CM | POA: Diagnosis not present

## 2018-05-10 DIAGNOSIS — E785 Hyperlipidemia, unspecified: Secondary | ICD-10-CM | POA: Insufficient documentation

## 2018-05-10 DIAGNOSIS — Z79899 Other long term (current) drug therapy: Secondary | ICD-10-CM | POA: Insufficient documentation

## 2018-05-10 DIAGNOSIS — I509 Heart failure, unspecified: Secondary | ICD-10-CM | POA: Insufficient documentation

## 2018-05-10 DIAGNOSIS — N183 Chronic kidney disease, stage 3 (moderate): Secondary | ICD-10-CM | POA: Diagnosis not present

## 2018-05-10 DIAGNOSIS — D509 Iron deficiency anemia, unspecified: Secondary | ICD-10-CM | POA: Diagnosis not present

## 2018-05-10 DIAGNOSIS — R7989 Other specified abnormal findings of blood chemistry: Secondary | ICD-10-CM | POA: Insufficient documentation

## 2018-05-10 DIAGNOSIS — Z5181 Encounter for therapeutic drug level monitoring: Secondary | ICD-10-CM

## 2018-05-10 DIAGNOSIS — M1990 Unspecified osteoarthritis, unspecified site: Secondary | ICD-10-CM | POA: Diagnosis not present

## 2018-05-10 DIAGNOSIS — E039 Hypothyroidism, unspecified: Secondary | ICD-10-CM | POA: Insufficient documentation

## 2018-05-10 DIAGNOSIS — F419 Anxiety disorder, unspecified: Secondary | ICD-10-CM | POA: Diagnosis not present

## 2018-05-10 DIAGNOSIS — J449 Chronic obstructive pulmonary disease, unspecified: Secondary | ICD-10-CM | POA: Insufficient documentation

## 2018-05-10 DIAGNOSIS — D500 Iron deficiency anemia secondary to blood loss (chronic): Secondary | ICD-10-CM | POA: Insufficient documentation

## 2018-05-10 LAB — CBC WITH DIFFERENTIAL/PLATELET
Basophils Absolute: 0.2 10*3/uL — ABNORMAL HIGH (ref 0.0–0.1)
Basophils Relative: 2 %
EOS ABS: 0.1 10*3/uL (ref 0.0–0.7)
EOS PCT: 2 %
HCT: 44.1 % (ref 36.0–46.0)
Hemoglobin: 14 g/dL (ref 12.0–15.0)
LYMPHS ABS: 0.7 10*3/uL (ref 0.7–4.0)
Lymphocytes Relative: 8 %
MCH: 30.1 pg (ref 26.0–34.0)
MCHC: 31.7 g/dL (ref 30.0–36.0)
MCV: 94.8 fL (ref 78.0–100.0)
Monocytes Absolute: 0.2 10*3/uL (ref 0.1–1.0)
Monocytes Relative: 2 %
Neutro Abs: 6.9 10*3/uL (ref 1.7–7.7)
Neutrophils Relative %: 86 %
Platelets: 287 10*3/uL (ref 150–400)
RBC: 4.65 MIL/uL (ref 3.87–5.11)
RDW: 29.9 % — ABNORMAL HIGH (ref 11.5–15.5)
WBC: 8 10*3/uL (ref 4.0–10.5)

## 2018-05-10 LAB — COMPREHENSIVE METABOLIC PANEL
ALT: 9 U/L (ref 0–44)
AST: 17 U/L (ref 15–41)
Albumin: 3.3 g/dL — ABNORMAL LOW (ref 3.5–5.0)
Alkaline Phosphatase: 103 U/L (ref 38–126)
Anion gap: 10 (ref 5–15)
BUN: 14 mg/dL (ref 8–23)
CHLORIDE: 105 mmol/L (ref 98–111)
CO2: 24 mmol/L (ref 22–32)
Calcium: 8.9 mg/dL (ref 8.9–10.3)
Creatinine, Ser: 0.94 mg/dL (ref 0.44–1.00)
GFR, EST NON AFRICAN AMERICAN: 58 mL/min — AB (ref >60)
Glucose, Bld: 136 mg/dL — ABNORMAL HIGH (ref 70–99)
POTASSIUM: 4 mmol/L (ref 3.5–5.1)
SODIUM: 139 mmol/L (ref 135–145)
Total Bilirubin: 1.2 mg/dL (ref 0.3–1.2)
Total Protein: 6.6 g/dL (ref 6.5–8.1)

## 2018-05-10 LAB — POCT INR: INR: 1.6 — AB (ref 2.0–3.0)

## 2018-05-10 LAB — LACTATE DEHYDROGENASE: LDH: 261 U/L — AB (ref 98–192)

## 2018-05-10 LAB — FOLATE: FOLATE: 11.4 ng/mL (ref >5.9)

## 2018-05-10 LAB — FERRITIN: FERRITIN: 40 ng/mL (ref 11–307)

## 2018-05-10 LAB — VITAMIN B12: Vitamin B-12: 343 pg/mL (ref 180–914)

## 2018-05-10 NOTE — Patient Instructions (Signed)
Take coumadin 1 1/2 tablets tonight, 1 tablet tomorrow night then resume 2.5mg  daily except 5mg  on Mondays and Thursdays Recheck in 2 weeks

## 2018-05-11 LAB — HEMOGLOBINOPATHY EVALUATION
HGB A: 97.6 % (ref 96.4–98.8)
HGB C: 0 %
HGB S QUANTITAION: 0 %
HGB VARIANT: 0 %
Hgb A2 Quant: 2.4 % (ref 1.8–3.2)
Hgb F Quant: 0 % (ref 0.0–2.0)

## 2018-05-12 LAB — METHYLMALONIC ACID, SERUM: METHYLMALONIC ACID, QUANTITATIVE: 1190 nmol/L — AB (ref 0–378)

## 2018-05-17 ENCOUNTER — Inpatient Hospital Stay (HOSPITAL_COMMUNITY): Payer: Medicare HMO | Admitting: Internal Medicine

## 2018-05-17 ENCOUNTER — Other Ambulatory Visit: Payer: Self-pay

## 2018-05-17 ENCOUNTER — Ambulatory Visit (HOSPITAL_COMMUNITY): Payer: Medicare Other

## 2018-05-17 ENCOUNTER — Encounter (HOSPITAL_COMMUNITY): Payer: Self-pay | Admitting: Internal Medicine

## 2018-05-17 VITALS — BP 139/80 | HR 90 | Temp 97.6°F | Resp 18 | Wt 148.0 lb

## 2018-05-17 DIAGNOSIS — D500 Iron deficiency anemia secondary to blood loss (chronic): Secondary | ICD-10-CM | POA: Diagnosis not present

## 2018-05-17 DIAGNOSIS — Z9981 Dependence on supplemental oxygen: Secondary | ICD-10-CM

## 2018-05-17 DIAGNOSIS — J449 Chronic obstructive pulmonary disease, unspecified: Secondary | ICD-10-CM | POA: Diagnosis not present

## 2018-05-17 DIAGNOSIS — N183 Chronic kidney disease, stage 3 (moderate): Secondary | ICD-10-CM | POA: Diagnosis not present

## 2018-05-17 DIAGNOSIS — I482 Chronic atrial fibrillation, unspecified: Secondary | ICD-10-CM | POA: Diagnosis not present

## 2018-05-17 DIAGNOSIS — D509 Iron deficiency anemia, unspecified: Secondary | ICD-10-CM

## 2018-05-17 DIAGNOSIS — Z7982 Long term (current) use of aspirin: Secondary | ICD-10-CM

## 2018-05-17 DIAGNOSIS — E785 Hyperlipidemia, unspecified: Secondary | ICD-10-CM | POA: Diagnosis not present

## 2018-05-17 DIAGNOSIS — D45 Polycythemia vera: Secondary | ICD-10-CM

## 2018-05-17 DIAGNOSIS — I130 Hypertensive heart and chronic kidney disease with heart failure and stage 1 through stage 4 chronic kidney disease, or unspecified chronic kidney disease: Secondary | ICD-10-CM

## 2018-05-17 DIAGNOSIS — Z7901 Long term (current) use of anticoagulants: Secondary | ICD-10-CM

## 2018-05-17 DIAGNOSIS — R7989 Other specified abnormal findings of blood chemistry: Secondary | ICD-10-CM | POA: Diagnosis not present

## 2018-05-17 DIAGNOSIS — F419 Anxiety disorder, unspecified: Secondary | ICD-10-CM

## 2018-05-17 DIAGNOSIS — Z79899 Other long term (current) drug therapy: Secondary | ICD-10-CM | POA: Diagnosis not present

## 2018-05-17 DIAGNOSIS — E039 Hypothyroidism, unspecified: Secondary | ICD-10-CM

## 2018-05-17 DIAGNOSIS — I509 Heart failure, unspecified: Secondary | ICD-10-CM | POA: Diagnosis not present

## 2018-05-17 DIAGNOSIS — Z86718 Personal history of other venous thrombosis and embolism: Secondary | ICD-10-CM

## 2018-05-17 DIAGNOSIS — M1990 Unspecified osteoarthritis, unspecified site: Secondary | ICD-10-CM

## 2018-05-17 DIAGNOSIS — K219 Gastro-esophageal reflux disease without esophagitis: Secondary | ICD-10-CM

## 2018-05-17 NOTE — Progress Notes (Signed)
Diagnosis Polycythemia vera ([place]) - Plan: CBC with Differential/Platelet, Comprehensive metabolic panel, Lactate dehydrogenase  Staging Cancer Staging No matching staging information was found for the patient.  Assessment and Plan:  1.  P Vera.  77 y.o. Female followed by Dr. Talbert Cage. 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   Labs done 05/10/2018 reviewed and showed WBC 8 HB 14 plts 287,000.  Chemistries WNL with K 4, Cr 0.94 and normal LFTs.   03/12/2018 reviewed and showed WBC 21.8 HB 8.3 plts 325,000.  Chemistries WNL with potassium of 4.5, Cr 0.87 and normal LFTs.    Counts have improved and pt will remain on 1000 mg daily.  She will have repeat labs in 07/2018.   2.  Anemia.  Hemoglobin is 14, ferritin is 40.  Pt was treated with IV iron on 8/20/ 2019 and 03/30/2018.  MCV is improved after IV iron and is 95.  HB electrophoresis is normal.   3.   Right toe amputation.  Follow-up with surgery as directed.   4.  Atrial fibrillation.  Patient is on Coumadin. -+ Follow-up with PCP for monitoring.   5  Hypertension.  Blood pressure is 139/80.   Follow-up with PCP.  Interval history.  Historical data obtained from note dated 11/11/2017.  Patient was previously followed by Dr. Talbert Cage  for Jak 2+ myeloproliferative disorder.  Patient was previously seen by vascular surgery and was diagnosed with dry gangrene and they say that her bypass graft in her right lower extremity was patent.  They recommended outpatient follow-up for her dry gangrene wounds.  She had a right lower extremity venous ultrasound performed which was negative for DVT on 06/10/2017.  Her hemoglobin dropped down to 7.2 g/dL while inpatient, and she was given 2 units PRBC transfusion in 06/2017.  She continues to take Hydrea.    Current Status: Patient is seen today for follow-up.  She is here to go over labs.  She is tolerating hydrea without problems.    Problem List Patient  Active Problem List   Diagnosis Date Noted  . Iron deficiency anemia due to chronic blood loss [D50.0] 03/18/2018  . Group B streptococcal infection [A49.1] 12/15/2017  . Open wound of right foot [S91.301A] 12/15/2017  . Right groin wound [S31.109A] 12/11/2017  . CKD (chronic kidney disease) stage 3, GFR 30-59 ml/min (HCC) [N18.3]   . Foot pain, right [M79.671]   . Cellulitis and abscess of right leg [L03.115, L02.415] 06/27/2017  . 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 Hypokalemia [E87.6] 06/27/2017  . Osteomyelitis of right foot ([place]) [M86.9] 06/27/2017  . S/P femoral-popliteal bypass surgery [Z95.828]   . Edema of right lower extremity [R60.0] 06/08/2017  . GERD (gastroesophageal reflux disease) [K21.9] 06/08/2017  . Pressure ulcer [L89.90] 05/29/2017  . Sepsis ([place]) [A41.9] 05/28/2017  . Hypotension [I95.9] 05/28/2017  . Pressure injury of skin [L89.90] 03/14/2017  . Acute renal failure (ARF) ([place]) [N17.9]   . Hyperlipidemia [E78.5] 03/05/2017  . Lower extremity arterial insufficiency, severe, right ([place]) [I73.9] 03/04/2017  . Chronic respiratory failure with hypoxia ([place]) [J96.11] 02/12/2017  . Cellulitis [L03.90] 07/03/2016  . Anemia [D64.9] 07/03/2016  . Cough [R05] 11/29/2015  . Hypoxemia [R09.02] 11/29/2015  . CHF (congestive heart failure) ([place]) [I50.9] 11/29/2015  . Acute on chronic respiratory failure with hypoxia (HCC) [J96.21]   . Pleural effusion on left [J90]   . Acute on chronic diastolic (congestive) heart failure ([place]) [I50.33]   . Dyspnea [R06.00] 11/22/2014  .  Pleural effusion [J90] 11/22/2014  . Hard of hearing [H91.90] 11/22/2014  . Thrombocytosis ([place]) [D47.3] 11/22/2014  . Acute respiratory failure with hypoxemia ([place]) [J96.01] 11/22/2014  . Encounter for therapeutic drug monitoring [Z51.81] 08/31/2013  . Aftercare following surgery of the circulatory system, [place] [Z48.812] 12/21/2012  . Pain in limb [M79.609] 10/19/2012  . PVD (peripheral vascular  disease) ([place]) [I73.9] 09/24/2012  . Swollen leg [M79.89] 09/24/2012  . 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 Arterial occlusion due to stenosis ([place]) [I77.1] 08/20/2012  . Wound infection after surgery [T81.49XA] 01/07/2012  . Compartment syndrome, nontraumatic, lower extremity [M79.A29] 11/29/2011  . Pain of right thigh [M79.651] 11/28/2011  . Hip fracture, right ([place]) [S72.001A] 11/15/2011  . Chronic atrial fibrillation [I48.20] 11/15/2011  . Anticoagulant long-term use [Z79.01] 11/15/2011  . HTN (hypertension) [I10] 11/15/2011  . Hypothyroidism [E03.9] 11/15/2011  . Polycythemia vera ([place]) [D45] 10/01/2011    Past Medical History Past Medical History:  Diagnosis Date  . Anxiety   . Arthritis   . Atrial fibrillation, chronic    a. on Coumadin for anticoagulation.   . Cellulitis 06/29/2017   RIGHT LOWER LEG  . CHF (congestive heart failure) ([place])    combined  . COPD (chronic obstructive pulmonary disease) ([place])    on home O2 4 lpm  . Deaf   . Dry gangrene ([place])    R foot  . DVT (deep venous thrombosis) ([place])   . Essential hypertension   . GERD (gastroesophageal reflux disease)   . HOH (hard of hearing)   . Hyperlipidemia   . Hypothyroidism   . Iron deficiency anemia due to chronic blood loss 03/18/2018  . Open wound    right groin  . Oxygen dependent    4 lpm  . Peripheral neuropathy   . Peripheral vascular disease ([place])   . 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 Ulcer of ankle ([place])    Bilateral 2015  . Varicose veins     Past Surgical History Past Surgical History:  Procedure Laterality Date  . ABDOMINAL AORTAGRAM N/A 09/14/2012   Procedure: ABDOMINAL [name];  Surgeon: Serafina Mitchell, MD;  [facility] CATH LAB;  Service: Cardiovascular;  Laterality: N/A;  . ABDOMINAL AORTOGRAM N/A 03/06/2017   Procedure: ABDOMINAL AORTOGRAM;   Surgeon: Elam Dutch, MD;  Location: [place] CV LAB;  Service: Cardiovascular;  Laterality: N/A;  . ABDOMINAL AORTOGRAM W/LOWER EXTREMITY N/A 09/21/2017   Procedure: ABDOMINAL AORTOGRAM W/LOWER EXTREMITY;  Surgeon: Waynetta Sandy, MD;  Location: [place] CV LAB;  Service: Cardiovascular;  Laterality: N/A;  . ABDOMINAL HYSTERECTOMY    . AMPUTATION Right 09/29/2017   Procedure: DEBRIDEMENT TOE AMPUTATION RIGHT FIFTH TOE;  Surgeon: Angelia Mould, MD;  Location: [place];  Service: Vascular;  Laterality: Right;  . APPLICATION OF WOUND VAC Right 09/29/2017   Procedure: APPLICATION OF WOUND VAC ON RIGHT FOOT;  Surgeon: Angelia Mould, MD;  Location: [place];  Service: Vascular;  Laterality: Right;  . CATARACT EXTRACTION W/PHACO Right 08/31/2017   Procedure: CATARACT EXTRACTION PHACO AND INTRAOCULAR LENS PLACEMENT RIGHT EYE;  Surgeon: Tonny Branch, MD;  Location: [facility];  Service: Ophthalmology;  Laterality: Right;  CDE: 13.27  . CATARACT EXTRACTION W/PHACO Left 09/14/2017   Procedure: CATARACT EXTRACTION PHACO AND INTRAOCULAR LENS PLACEMENT LEFT EYE;  Surgeon: Tonny Branch, MD;  Location: [facility];  Service: Ophthalmology;  Laterality: Left;  CDE: 13.02  .  CORONARY ANGIOPLASTY    . DRESSING CHANGE UNDER ANESTHESIA  12/02/2011   Procedure: DRESSING CHANGE UNDER ANESTHESIA;  Surgeon: Carole Civil, MD;  Location: [facility];  Service: Orthopedics;  Laterality: Right;  . ENDARTERECTOMY FEMORAL Right 09/15/2012   Procedure: ENDARTERECTOMY FEMORAL;  Surgeon: Mal Misty, MD;  Location: [place];  Service: Vascular;  Laterality: Right;  . FASCIOTOMY  11/29/2011   Procedure: FASCIOTOMY;  Surgeon: Carole Civil, MD;  [facility]: [facility];  Service: Orthopedics;  Laterality: Right;  right thigh   . FEMORAL-POPLITEAL BYPASS GRAFT Right 09/15/2012   Procedure: BYPASS GRAFT FEMORAL-POPLITEAL ARTERY;  Surgeon: Mal Misty, MD;  Location: [place];  Service: Vascular;  Laterality: Right;  .  FEMORAL-TIBIAL BYPASS GRAFT Right 03/11/2017   Procedure: RIGHT FEMORAL-TIBIAL BYPASS IN-SITU GREATER SAPHENOUS VEIN;  Surgeon: Waynetta Sandy, MD;  Location: [place];  Service: Vascular;  Laterality: Right;  . GROIN DEBRIDEMENT Right 12/10/2017   Procedure: GROIN DEBRIDEMENT EXPLORATION;  Surgeon: Waynetta Sandy, MD;  Location: [place];  Service: Vascular;  Laterality: Right;  . HIP PINNING,CANNULATED  11/19/2011   Procedure: CANNULATED HIP PINNING;  Surgeon: Sanjuana Kava, MD;  [facility];  Service: Orthopedics;  Laterality: Right;  . LOWER EXTREMITY ANGIOGRAPHY Bilateral 03/06/2017   Procedure: Lower Extremity Angiography;  Surgeon: Elam Dutch, MD;  Location: [place] CV LAB;  Service: Cardiovascular;  Laterality: Bilateral;  . PATCH ANGIOPLASTY Right 09/15/2012   Procedure: PATCH ANGIOPLASTY;  Surgeon: Mal Misty, MD;  Location: [place];  Service: Vascular;  Laterality: Right;  . TUBAL LIGATION      Family History Family History  Problem Relation Age of Onset  . Heart failure Mother   . Heart disease Mother   . Stroke Father   . Heart disease Sister   . Diabetes Son   . Hypertension Sister   . Hypertension Sister   . Stroke Sister      Social History  reports that she has never smoked. She has never used smokeless tobacco. She reports that she does not drink alcohol or use drugs.  Medications  Current Outpatient Medications:  .  aspirin EC 81 MG EC tablet, Take 1 tablet (81 mg total) by mouth daily., Disp: , Rfl:  .  CARTIA XT 240 MG 24 hr capsule, TAKE 1 CAPSULE BY MOUTH ONCE DAILY, Disp: 90 capsule, Rfl: 3 .  cephALEXin (KEFLEX) 500 MG capsule, Take 1 capsule (500 mg total) by mouth 3 (three) times daily., Disp: 21 capsule, Rfl: 0 .  diltiazem (CARDIZEM CD) 240 MG 24 hr capsule, Take 240 mg by mouth daily., Disp: , Rfl:  .  gabapentin (NEURONTIN) 100 MG capsule, Take 100 mg by mouth 3 (three) times daily., Disp: , Rfl: 2 .  hydroxyurea (HYDREA)  500 MG capsule, Take 2 capsules (1,000 mg total) by mouth daily. May take with food to minimize GI side effects., Disp: 180 capsule, Rfl: 3 .  levothyroxine (SYNTHROID, LEVOTHROID) 125 MCG tablet, Take 125 mcg by mouth daily before breakfast. , Disp: , Rfl: 1 .  pravastatin (PRAVACHOL) 20 MG tablet, Take 10 mg by mouth daily., Disp: , Rfl:  .  torsemide (DEMADEX) 20 MG tablet, TAKE 1 TABLET BY MOUTH ONCE DAILY AS NEEDED MAY TAKE FOR WEIGHT GAIN OF 3 LBS OVER 24 HOURS, Disp: , Rfl: 11 .  traMADol (ULTRAM) 50 MG tablet, Take 1 tablet (50 mg total) by mouth every 6 (six) hours as needed., Disp: 20 tablet, Rfl: 0 .  warfarin (COUMADIN) 5 MG tablet, Take 0.5-1 tablets (2.5-5 mg total) by mouth See admin instructions. 5 mg on Monday, all other days 2.5 mg, Disp: , Rfl:   Allergies Patient has no known allergies.  Review of Systems Review of Systems - Oncology ROS negative   Physical Exam  Vitals Wt Readings from Last 3 Encounters:  05/17/18 148 lb (67.1 kg)  03/19/18 142 lb 8 oz (64.6 kg)  03/18/18 142 lb 8 oz (64.6 kg)   Temp Readings from Last 3 Encounters:  05/17/18 97.6 F (36.4 C) (Oral)  03/30/18 (!) 97.5 F (36.4 C) (Oral)  03/23/18 (!) 97.5 F (36.4 C) (Oral)   BP Readings from Last 3 Encounters:  05/17/18 139/80  03/30/18 137/71  03/23/18 123/60   Pulse Readings from Last 3 Encounters:  05/17/18 90  03/30/18 86  03/23/18 74   Constitutional: Well-developed, well-nourished, and in no distress.   HENT: Head: Normocephalic and atraumatic.  Mouth/Throat: No oropharyngeal exudate. Mucosa moist. Eyes: Pupils are equal, round, and reactive to light. Conjunctivae are normal. No scleral icterus.  Neck: Normal range of motion. Neck supple. No JVD present.  Cardiovascular: Normal rate, regular rhythm and normal heart sounds.  Exam reveals no gallop and no friction rub.   No murmur heard. Pulmonary/Chest: Effort normal and breath sounds normal. No respiratory distress. No  wheezes.No rales.  Abdominal: Soft. Bowel sounds are normal. No distension. There is no tenderness. There is no guarding.  Musculoskeletal: No edema or tenderness. Evidence of prior surgery Lymphadenopathy: No cervical or supraclavicular adenopathy.  Neurological: Alert and oriented to person, place, and time. No cranial nerve deficit.  Skin: Skin is warm and dry. No rash noted. No erythema. No pallor.  Psychiatric: Affect and judgment normal.   Labs No visits with results within 3 Day(s) from this visit.  Latest known visit with results is:  Anti-coag visit on 05/10/2018  Component Date Value Ref Range Status  . INR 05/10/2018 1.6* 2.0 - 3.0 Final     Pathology Orders Placed This Encounter  Procedures  . CBC with Differential/Platelet    Standing Status:   Future    Standing Expiration Date:   05/17/2020  . Comprehensive metabolic panel    Standing Status:   Future    Standing Expiration Date:   05/17/2020  . Lactate dehydrogenase    Standing Status:   Future    Standing Expiration Date:   05/17/2020       Zoila Shutter MD

## 2018-05-24 ENCOUNTER — Ambulatory Visit (INDEPENDENT_AMBULATORY_CARE_PROVIDER_SITE_OTHER): Payer: Medicare HMO | Admitting: *Deleted

## 2018-05-24 DIAGNOSIS — D45 Polycythemia vera: Secondary | ICD-10-CM | POA: Diagnosis not present

## 2018-05-24 DIAGNOSIS — I5030 Unspecified diastolic (congestive) heart failure: Secondary | ICD-10-CM | POA: Diagnosis not present

## 2018-05-24 DIAGNOSIS — Z23 Encounter for immunization: Secondary | ICD-10-CM | POA: Diagnosis not present

## 2018-05-24 DIAGNOSIS — I10 Essential (primary) hypertension: Secondary | ICD-10-CM | POA: Diagnosis not present

## 2018-05-24 DIAGNOSIS — Z0001 Encounter for general adult medical examination with abnormal findings: Secondary | ICD-10-CM | POA: Diagnosis not present

## 2018-05-24 DIAGNOSIS — Z6820 Body mass index (BMI) 20.0-20.9, adult: Secondary | ICD-10-CM | POA: Diagnosis not present

## 2018-05-24 DIAGNOSIS — Z1389 Encounter for screening for other disorder: Secondary | ICD-10-CM | POA: Diagnosis not present

## 2018-05-24 DIAGNOSIS — Z5181 Encounter for therapeutic drug level monitoring: Secondary | ICD-10-CM

## 2018-05-24 DIAGNOSIS — E039 Hypothyroidism, unspecified: Secondary | ICD-10-CM | POA: Diagnosis not present

## 2018-05-24 DIAGNOSIS — I482 Chronic atrial fibrillation, unspecified: Secondary | ICD-10-CM | POA: Diagnosis not present

## 2018-05-24 LAB — POCT INR: INR: 2 (ref 2.0–3.0)

## 2018-05-24 NOTE — Patient Instructions (Signed)
Continue coumadin 2.5mg  daily except 5mg  on Mondays and Thursdays Recheck in 3 weeks

## 2018-05-28 DIAGNOSIS — M6281 Muscle weakness (generalized): Secondary | ICD-10-CM | POA: Diagnosis not present

## 2018-05-28 DIAGNOSIS — I5040 Unspecified combined systolic (congestive) and diastolic (congestive) heart failure: Secondary | ICD-10-CM | POA: Diagnosis not present

## 2018-05-28 DIAGNOSIS — I5043 Acute on chronic combined systolic (congestive) and diastolic (congestive) heart failure: Secondary | ICD-10-CM | POA: Diagnosis not present

## 2018-06-14 ENCOUNTER — Ambulatory Visit (INDEPENDENT_AMBULATORY_CARE_PROVIDER_SITE_OTHER): Payer: Medicare HMO | Admitting: *Deleted

## 2018-06-14 DIAGNOSIS — I482 Chronic atrial fibrillation, unspecified: Secondary | ICD-10-CM | POA: Diagnosis not present

## 2018-06-14 DIAGNOSIS — Z5181 Encounter for therapeutic drug level monitoring: Secondary | ICD-10-CM

## 2018-06-14 LAB — POCT INR: INR: 2.7 (ref 2.0–3.0)

## 2018-06-14 NOTE — Patient Instructions (Signed)
Continue coumadin 2.5mg  daily except 5mg  on Mondays and Thursdays Recheck in 4 weeks

## 2018-06-18 ENCOUNTER — Encounter: Payer: Self-pay | Admitting: Family

## 2018-06-18 ENCOUNTER — Ambulatory Visit (HOSPITAL_COMMUNITY)
Admission: RE | Admit: 2018-06-18 | Discharge: 2018-06-18 | Disposition: A | Discharge status: Home / Self Care | Payer: Medicare HMO | Source: Ambulatory Visit | Attending: Family Medicine | Admitting: Family Medicine

## 2018-06-18 ENCOUNTER — Other Ambulatory Visit: Payer: Self-pay

## 2018-06-18 ENCOUNTER — Ambulatory Visit (INDEPENDENT_AMBULATORY_CARE_PROVIDER_SITE_OTHER): Payer: Medicare HMO | Admitting: Family

## 2018-06-18 ENCOUNTER — Ambulatory Visit (INDEPENDENT_AMBULATORY_CARE_PROVIDER_SITE_OTHER)
Admission: RE | Admit: 2018-06-18 | Discharge: 2018-06-18 | Disposition: A | Discharge status: Home / Self Care | Payer: Medicare HMO | Source: Ambulatory Visit | Attending: Family Medicine | Admitting: Family Medicine

## 2018-06-18 VITALS — BP 148/94 | HR 84 | Temp 97.4°F | Resp 16 | Ht 72.0 in | Wt 146.5 lb

## 2018-06-18 DIAGNOSIS — S90414A Abrasion, right lesser toe(s), initial encounter: Secondary | ICD-10-CM | POA: Diagnosis not present

## 2018-06-18 DIAGNOSIS — D45 Polycythemia vera: Secondary | ICD-10-CM

## 2018-06-18 DIAGNOSIS — I779 Disorder of arteries and arterioles, unspecified: Secondary | ICD-10-CM

## 2018-06-18 DIAGNOSIS — Z7901 Long term (current) use of anticoagulants: Secondary | ICD-10-CM

## 2018-06-18 DIAGNOSIS — Z95828 Presence of other vascular implants and grafts: Secondary | ICD-10-CM

## 2018-06-18 DIAGNOSIS — I739 Peripheral vascular disease, unspecified: Secondary | ICD-10-CM | POA: Insufficient documentation

## 2018-06-18 NOTE — Progress Notes (Signed)
[facility] [place]   CC: Follow up peripheral artery occlusive disease  History of Present Illness Charlotte [name] is a 77 y.o. female who is s/pdebridement of right groin to 7 x 1.5 x 2cm deep, placement of negative pressure dressingon 12-10-17 by Dr. [name] forright groin draining sinus tract.  She is alsos/p ray amputation of right fifth toe and placement of VACon 09-29-17 by Dr. Scot Dock for nonhealing wound right fifth toe.  She has had several vascular procedures on her lower extremities, includingRight femoral endarterectomy with Dacron patch angioplasty and profundoplastyand right femoral popliteal bypass graft using 6 mm Gore-Tex (above-knee)on 09-15-12 by Dr. Kellie Simmering.  Dr. [name] last evaluated pt on 01-08-18. At that timeright groin with fibrinous tissue Strong peroneal and DP signals and a palpable pulse on the right medial thigh. Dr. Angela Nevin silver nitrate on the right groin with a dry dressing. We can move away from wet to dries at this time and hopefully this will heal. Therewas underlying graft to the proximal anastomosis and this wound. The right foot appearedto be healing well with dry dressings as well. Pt was tofollow-up in 4 to 6 weeks. She completed antibiotics. If the wounds do not progress we can certainly see her sooner.  She returns today for follow up.   Her PCP prescribed cephalexin which she finished on 03-18-18 to address right 4th toe wound that pt states is likely caused by her right foot rolling outward as she walks, wearing the post op shoe.  Pt husband states HH advised a brace to correct outward roll, pt is not seeing a posdiatrist, will refer to one in [place] to assist with prescribing the best type of corrective brace or footwear for this.   Review of Systems Shortness of breath with activity She has COPD and CHF, has supplemental O2 via [place].   Wound in right groin has healed and right lateral foot site of  small toe amputation has healed. New wound right 4th toe from recurrent abrasion inside shoe when foot rolls has also healed since her visit in August 2019. She has seen a podiatrist in [place] and has new protective anti-roll boots made by [employer]. She has polycythemia vera.    Diabetic: No Tobacco use: non-smoker  Pt meds include: Statin :Yes Betablocker: No ASA: Yes Other anticoagulants/antiplatelets:  coumadin for atrial fib.     Past Medical History:  Diagnosis Date  . Anxiety   . Arthritis   . Atrial fibrillation, chronic    a. on Coumadin for anticoagulation.   . Cellulitis 06/29/2017   RIGHT LOWER LEG  . CHF (congestive heart failure) ([place])    combined  . COPD (chronic obstructive pulmonary disease) ([place])    on home O2 4 lpm  . Deaf   . 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 DVT (deep venous thrombosis) ([place])   . Essential hypertension   . GERD (gastroesophageal reflux disease)   . HOH (hard of hearing)   . Hyperlipidemia   . Hypothyroidism   . Iron deficiency anemia due to chronic blood loss 03/18/2018  . Open wound    right groin  . Oxygen dependent    4 lpm  . Peripheral neuropathy   . Peripheral vascular disease ([place])   . 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 Ulcer of ankle ([place])    Bilateral 2015  . Varicose veins     Social History Social History   Tobacco Use  . Smoking status: Never Smoker  .  Smokeless tobacco: Never Used  Substance Use Topics  . Alcohol use: No    Alcohol/week: 0.0 standard drinks  . Drug use: No    Family History Family History  Problem Relation Age of Onset  . Heart failure Mother   . Heart disease Mother   . Stroke Father   . Heart disease Sister   . Diabetes Son   . Hypertension Sister   . Hypertension Sister   . Stroke Sister     Past Surgical History:  Procedure Laterality Date  . ABDOMINAL AORTAGRAM N/A 09/14/2012   Procedure: ABDOMINAL [name];  Surgeon: Serafina Mitchell, MD;  Location: [facility] CATH LAB;   Service: Cardiovascular;  Laterality: N/A;  . ABDOMINAL AORTOGRAM N/A 03/06/2017   Procedure: ABDOMINAL AORTOGRAM;  Surgeon: Elam Dutch, MD;  Location: [place] CV LAB;  Service: Cardiovascular;  Laterality: N/A;  . ABDOMINAL AORTOGRAM W/LOWER EXTREMITY N/A 09/21/2017   Procedure: ABDOMINAL AORTOGRAM W/LOWER EXTREMITY;  Surgeon: Waynetta Sandy, MD;  Location: [facility] CV LAB;  Service: Cardiovascular;  Laterality: N/A;  . ABDOMINAL HYSTERECTOMY    . AMPUTATION Right 09/29/2017   Procedure: DEBRIDEMENT TOE AMPUTATION RIGHT FIFTH TOE;  Surgeon: Angelia Mould, MD;  Location: [place];  Service: Vascular;  Laterality: Right;  . APPLICATION OF WOUND VAC Right 09/29/2017   Procedure: APPLICATION OF WOUND VAC ON RIGHT FOOT;  Surgeon: Angelia Mould, MD;  Location: [place];  Service: Vascular;  Laterality: Right;  . CATARACT EXTRACTION W/PHACO Right 08/31/2017   Procedure: CATARACT EXTRACTION PHACO AND INTRAOCULAR LENS PLACEMENT RIGHT EYE;  Surgeon: Tonny Branch, MD;  Location: [facility];  Service: Ophthalmology;  Laterality: Right;  CDE: 13.27  . CATARACT EXTRACTION W/PHACO Left 09/14/2017   Procedure: CATARACT EXTRACTION PHACO AND INTRAOCULAR LENS PLACEMENT LEFT EYE;  Surgeon: Tonny Branch, MD;  Location: [facility];  Service: Ophthalmology;  Laterality: Left;  CDE: 13.02  . CORONARY ANGIOPLASTY    . DRESSING CHANGE UNDER ANESTHESIA  12/02/2011   Procedure: DRESSING CHANGE UNDER ANESTHESIA;  Surgeon: Carole Civil, MD;  Location: [facility];  Service: Orthopedics;  Laterality: Right;  . ENDARTERECTOMY FEMORAL Right 09/15/2012   Procedure: ENDARTERECTOMY FEMORAL;  Surgeon: Mal Misty, MD;  Location: [place];  Service: Vascular;  Laterality: Right;  . FASCIOTOMY  11/29/2011   Procedure: FASCIOTOMY;  Surgeon: Carole Civil, MD;  [facility]: [facility];  Service: Orthopedics;  Laterality: Right;  right thigh   . FEMORAL-POPLITEAL BYPASS GRAFT Right 09/15/2012   Procedure: BYPASS GRAFT  FEMORAL-POPLITEAL ARTERY;  Surgeon: Mal Misty, MD;  Location: [place];  Service: Vascular;  Laterality: Right;  . FEMORAL-TIBIAL BYPASS GRAFT Right 03/11/2017   Procedure: RIGHT FEMORAL-TIBIAL BYPASS IN-SITU GREATER SAPHENOUS VEIN;  Surgeon: Waynetta Sandy, MD;  Location: [facility];  Service: Vascular;  Laterality: Right;  . GROIN DEBRIDEMENT Right 12/10/2017   Procedure: GROIN DEBRIDEMENT EXPLORATION;  Surgeon: Waynetta Sandy, MD;  Location: [facility];  Service: Vascular;  Laterality: Right;  . HIP PINNING,CANNULATED  11/19/2011   Procedure: CANNULATED HIP PINNING;  Surgeon: Sanjuana Kava, MD;  [facility];  Service: Orthopedics;  Laterality: Right;  . LOWER EXTREMITY ANGIOGRAPHY Bilateral 03/06/2017   Procedure: Lower Extremity Angiography;  Surgeon: Elam Dutch, MD;  Location: [facility] CV LAB;  Service: Cardiovascular;  Laterality: Bilateral;  . PATCH ANGIOPLASTY Right 09/15/2012   Procedure: PATCH ANGIOPLASTY;  Surgeon: Mal Misty, MD;  Location: [facility];  Service: Vascular;  Laterality: Right;  . TUBAL LIGATION  No Known Allergies  Current Outpatient Medications  Medication Sig Dispense Refill  . aspirin EC 81 MG EC tablet Take 1 tablet (81 mg total) by mouth daily.    Marland Kitchen CARTIA XT 240 MG 24 hr capsule TAKE 1 CAPSULE BY MOUTH ONCE DAILY 90 capsule 3  . cephALEXin (KEFLEX) 500 MG capsule Take 1 capsule (500 mg total) by mouth 3 (three) times daily. 21 capsule 0  . diltiazem (CARDIZEM CD) 240 MG 24 hr capsule Take 240 mg by mouth daily.    Marland Kitchen gabapentin (NEURONTIN) 100 MG capsule Take 100 mg by mouth 3 (three) times daily.  2  . hydroxyurea (HYDREA) 500 MG capsule Take 2 capsules (1,000 mg total) by mouth daily. May take with food to minimize GI side effects. 180 capsule 3  . levothyroxine (SYNTHROID, LEVOTHROID) 125 MCG tablet Take 125 mcg by mouth daily before breakfast.   1  . pravastatin (PRAVACHOL) 20 MG tablet Take 10 mg by mouth daily.    Marland Kitchen torsemide  (DEMADEX) 20 MG tablet TAKE 1 TABLET BY MOUTH ONCE DAILY AS NEEDED MAY TAKE FOR WEIGHT GAIN OF 3 LBS OVER 24 HOURS  11  . traMADol (ULTRAM) 50 MG tablet Take 1 tablet (50 mg total) by mouth every 6 (six) hours as needed. 20 tablet 0  . warfarin (COUMADIN) 5 MG tablet Take 0.5-1 tablets (2.5-5 mg total) by mouth See admin instructions. 5 mg on Monday, all other days 2.5 mg     No current facility-administered medications for this visit.     ROS: See HPI for pertinent positives and negatives.   Physical Examination  Vitals:   06/18/18 1411  BP: (!) 148/94  Pulse: 84  Resp: 16  Temp: (!) 97.4 F (36.3 C)  TempSrc: Oral  SpO2: 92%  Weight: 146 lb 8 oz (66.5 kg)  Height: 6' (1.829 m)   Body mass index is 19.87 kg/m.  General: A&O x 3, WDWN, thin frail appearing female.   Gait: in w/c, not observed, she does walk with her walker at home HENT: No gross abnormalities.  Eyes: PERRLA. Pulmonary: Respirations are non labored, no rales, rhonchi, or wheezes, fairly good air movement in all fields, supplemental O2 at 2L via [place]  Cardiac: Irregular rhythm with controlled rate, no detected murmur       Carotid Bruits Right Left   Negative Negative   Radial pulses are 2+ palpable bilaterally   Adominal aortic pulse is not palpable                Healed right 5th toe ray amputation site.     Healed wound at right 4th toe                     VASCULAR EXAM: Extremities without ischemic changes, without Gangrene; with no open wounds. 1+ pitting, 2+ non pitting edema dorsal aspect right foot.  Right 5th toe is surgically absent.  LE Pulses Right Left       FEMORAL  2+ palpable  2+ palpable        POPLITEAL  not palpable   not palpable       POSTERIOR TIBIAL  not palpable   not palpable        DORSALIS PEDIS      ANTERIOR  TIBIAL Faintly palpable  not palpable    Abdomen: soft, NT, no palpable masses. Skin: no rashes, Hemosiderin deposits both lower legs in a gaiter pattern. See Extremities Musculoskeletal: no muscle wasting or atrophy.      Neurologic: A&O X 3; appropriate affect, Sensation is normal; MOTOR FUNCTION:  moving all extremities equally, motor strength 4/5 throughout. Speech is fluent/normal. CN 2-12 intact except is hard of hearing Psychiatric: Thought content is normal, mood appropriate for clinical situation.       ASSESSMENT: LUANNA WEESNER is a 77 y.o. female who is s/pdebridement of right groin to 7 x 1.5 x 2cm deep, placement of negative pressure dressingon 12-10-17 by Dr. [name] forright groin draining sinus tract.  She is alsos/p ray amputation of right fifth toe and placement of VACon 09-29-17 by Dr. Scot Dock for nonhealing wound right fifth toe. Right groin wound has healed.   She has had several vascular procedures on her lower extremities, includingRight femoral endarterectomy with Dacron patch angioplasty and profundoplastyand right femoral popliteal bypass graft using 6 mm Gore-Tex (above-knee)on 09-15-12 by Dr. Kellie Simmering.  Dr. [name] spoke with pt and husband and examined pt.  Right 5th toe amputation site has healed. Right 4th toe abrasion has healed.  Pt has new anti roll protective boots ordered by a podiatrist in [place], made by [employer].   Pain in right foot is present, takes 100 mg tramadol bid which helps.  Her walking is limited by her dyspnea, she has CHF, sees Dr. Jacinta Shoe.     DATA  Right LE Arterial Duplex (06-19-18): Femora-tibial-peroneal bypass graft with no stenosis, triphasic waveforms at inflow, biphasic at proximal anastomosis and graft, monophasic from mid graft to outflow. Waveforms were all tri and biphasic on 03-19-18.   ABI (Date: 06/18/2018):  R:   ABI: [place] (was [place] on 03-19-18),   PT: mono (was bi)  DP: mono (was bi)  TBI:   0.65, toe pressure 104, (was 0.97)  L:   ABI: [place] (was [place]),   PT: tri  DP: tri  TBI: 0.67, toe pressure 107, (was 1.84) Bilateral ABI remain inaccurate due to non compressible vessels.  Bilateral toe pressures are normal.  Right ABI waveforms are monophasic, left remain triphasic.    PLAN:  Based on the patient's vascular studies and examination, and after Dr. [name] spoke with and examined pt, pt will return to clinic in 6 months for AB's and right LE arterial duplex.   I discussed in depth with the patient the nature of atherosclerosis, and emphasized the importance of maximal medical management including strict control of blood pressure, blood glucose, and lipid levels, obtaining regular exercise, and continued cessation of smoking.  The patient is aware that without maximal medical management the underlying atherosclerotic disease process will progress, limiting the benefit of any interventions.  The patient was given information about PAD including signs, symptoms, treatment, what symptoms should prompt the patient to seek immediate medical care, and risk reduction measures to take.  Clemon Chambers, RN, MSN, FNP-C [facility] [employer] Phone: [phone]  Clinic MD: [name]  06/18/18 2:14 PM

## 2018-06-18 NOTE — Patient Instructions (Signed)

## 2018-06-22 LAB — ALPHA-THALASSEMIA GENOTYPR

## 2018-06-28 DIAGNOSIS — I5043 Acute on chronic combined systolic (congestive) and diastolic (congestive) heart failure: Secondary | ICD-10-CM | POA: Diagnosis not present

## 2018-06-28 DIAGNOSIS — I5040 Unspecified combined systolic (congestive) and diastolic (congestive) heart failure: Secondary | ICD-10-CM | POA: Diagnosis not present

## 2018-06-28 DIAGNOSIS — M6281 Muscle weakness (generalized): Secondary | ICD-10-CM | POA: Diagnosis not present

## 2018-07-05 DIAGNOSIS — I4891 Unspecified atrial fibrillation: Secondary | ICD-10-CM | POA: Diagnosis not present

## 2018-07-05 DIAGNOSIS — Z89421 Acquired absence of other right toe(s): Secondary | ICD-10-CM | POA: Diagnosis not present

## 2018-07-05 DIAGNOSIS — I739 Peripheral vascular disease, unspecified: Secondary | ICD-10-CM | POA: Diagnosis not present

## 2018-07-05 DIAGNOSIS — I110 Hypertensive heart disease with heart failure: Secondary | ICD-10-CM | POA: Diagnosis not present

## 2018-07-05 DIAGNOSIS — G629 Polyneuropathy, unspecified: Secondary | ICD-10-CM | POA: Diagnosis not present

## 2018-07-05 DIAGNOSIS — E039 Hypothyroidism, unspecified: Secondary | ICD-10-CM | POA: Diagnosis not present

## 2018-07-05 DIAGNOSIS — J449 Chronic obstructive pulmonary disease, unspecified: Secondary | ICD-10-CM | POA: Diagnosis not present

## 2018-07-05 DIAGNOSIS — E785 Hyperlipidemia, unspecified: Secondary | ICD-10-CM | POA: Diagnosis not present

## 2018-07-05 DIAGNOSIS — D45 Polycythemia vera: Secondary | ICD-10-CM | POA: Diagnosis not present

## 2018-07-05 DIAGNOSIS — I509 Heart failure, unspecified: Secondary | ICD-10-CM | POA: Diagnosis not present

## 2018-07-07 ENCOUNTER — Inpatient Hospital Stay (HOSPITAL_COMMUNITY): Payer: Medicare HMO | Attending: Hematology

## 2018-07-07 DIAGNOSIS — D45 Polycythemia vera: Secondary | ICD-10-CM

## 2018-07-07 DIAGNOSIS — Z79899 Other long term (current) drug therapy: Secondary | ICD-10-CM | POA: Insufficient documentation

## 2018-07-07 DIAGNOSIS — I482 Chronic atrial fibrillation, unspecified: Secondary | ICD-10-CM | POA: Diagnosis not present

## 2018-07-07 DIAGNOSIS — D649 Anemia, unspecified: Secondary | ICD-10-CM | POA: Insufficient documentation

## 2018-07-07 DIAGNOSIS — Z7982 Long term (current) use of aspirin: Secondary | ICD-10-CM | POA: Insufficient documentation

## 2018-07-07 DIAGNOSIS — Z7901 Long term (current) use of anticoagulants: Secondary | ICD-10-CM | POA: Diagnosis not present

## 2018-07-07 DIAGNOSIS — Z9981 Dependence on supplemental oxygen: Secondary | ICD-10-CM | POA: Diagnosis not present

## 2018-07-07 LAB — CBC WITH DIFFERENTIAL/PLATELET
BAND NEUTROPHILS: 6 %
BASOS PCT: 2 %
Basophils Absolute: 0.2 10*3/uL — ABNORMAL HIGH (ref 0.0–0.1)
EOS PCT: 3 %
Eosinophils Absolute: 0.2 10*3/uL (ref 0.0–0.5)
HCT: 48.2 % — ABNORMAL HIGH (ref 36.0–46.0)
Hemoglobin: 15.1 g/dL — ABNORMAL HIGH (ref 12.0–15.0)
Lymphocytes Relative: 13 %
Lymphs Abs: 1.1 10*3/uL (ref 0.7–4.0)
MCH: 33.9 pg (ref 26.0–34.0)
MCHC: 31.3 g/dL (ref 30.0–36.0)
MCV: 108.1 fL — AB (ref 80.0–100.0)
MONO ABS: 0.3 10*3/uL (ref 0.1–1.0)
Monocytes Relative: 4 %
Neutro Abs: 6.5 10*3/uL (ref 1.7–7.7)
Neutrophils Relative %: 72 %
PLATELETS: 215 10*3/uL (ref 150–400)
RBC: 4.46 MIL/uL (ref 3.87–5.11)
RDW: 18.9 % — AB (ref 11.5–15.5)
WBC: 8.3 10*3/uL (ref 4.0–10.5)
nRBC: 0 % (ref 0.0–0.2)

## 2018-07-07 LAB — COMPREHENSIVE METABOLIC PANEL
ALT: 8 U/L (ref 0–44)
AST: 14 U/L — ABNORMAL LOW (ref 15–41)
Albumin: 3.5 g/dL (ref 3.5–5.0)
Alkaline Phosphatase: 113 U/L (ref 38–126)
Anion gap: 7 (ref 5–15)
BUN: 13 mg/dL (ref 8–23)
CHLORIDE: 106 mmol/L (ref 98–111)
CO2: 26 mmol/L (ref 22–32)
CREATININE: 0.87 mg/dL (ref 0.44–1.00)
Calcium: 9.1 mg/dL (ref 8.9–10.3)
Glucose, Bld: 93 mg/dL (ref 70–99)
POTASSIUM: 4 mmol/L (ref 3.5–5.1)
Sodium: 139 mmol/L (ref 135–145)
Total Bilirubin: 1.3 mg/dL — ABNORMAL HIGH (ref 0.3–1.2)
Total Protein: 7 g/dL (ref 6.5–8.1)

## 2018-07-07 LAB — LACTATE DEHYDROGENASE: LDH: 392 U/L — AB (ref 98–192)

## 2018-07-14 ENCOUNTER — Inpatient Hospital Stay (HOSPITAL_COMMUNITY): Payer: Medicare HMO | Admitting: Internal Medicine

## 2018-07-14 ENCOUNTER — Encounter (HOSPITAL_COMMUNITY): Payer: Self-pay | Admitting: Internal Medicine

## 2018-07-14 ENCOUNTER — Ambulatory Visit (INDEPENDENT_AMBULATORY_CARE_PROVIDER_SITE_OTHER): Payer: Medicare HMO | Admitting: *Deleted

## 2018-07-14 VITALS — BP 133/68 | HR 66 | Temp 97.7°F | Resp 20 | Wt 146.5 lb

## 2018-07-14 DIAGNOSIS — Z5181 Encounter for therapeutic drug level monitoring: Secondary | ICD-10-CM

## 2018-07-14 DIAGNOSIS — I482 Chronic atrial fibrillation, unspecified: Secondary | ICD-10-CM

## 2018-07-14 DIAGNOSIS — Z79899 Other long term (current) drug therapy: Secondary | ICD-10-CM

## 2018-07-14 DIAGNOSIS — Z9981 Dependence on supplemental oxygen: Secondary | ICD-10-CM

## 2018-07-14 DIAGNOSIS — D45 Polycythemia vera: Secondary | ICD-10-CM | POA: Diagnosis not present

## 2018-07-14 DIAGNOSIS — Z7982 Long term (current) use of aspirin: Secondary | ICD-10-CM | POA: Diagnosis not present

## 2018-07-14 DIAGNOSIS — D649 Anemia, unspecified: Secondary | ICD-10-CM

## 2018-07-14 DIAGNOSIS — Z7901 Long term (current) use of anticoagulants: Secondary | ICD-10-CM

## 2018-07-14 LAB — POCT INR: INR: 2.6 (ref 2.0–3.0)

## 2018-07-14 MED ORDER — HYDROXYUREA 500 MG PO CAPS: 1000.0000 mg | ORAL_CAPSULE | Freq: Every day | ORAL | 3 refills | Status: AC

## 2018-07-14 NOTE — Patient Instructions (Signed)
Continue coumadin 2.5mg  daily except 5mg  on Mondays and Thursdays Recheck in 5 weeks

## 2018-07-14 NOTE — Patient Instructions (Signed)
[facility] at [facility] Discharge Instructions  You were seen by Dr. Higgs today   Thank you for choosing [facility] at [facility] to provide your oncology and hematology care.  To afford each patient quality time with our provider, please arrive at least 15 minutes before your scheduled appointment time.   If you have a lab appointment with the Cancer Center please come in thru the  Main Entrance and check in at the main information desk  You need to re-schedule your appointment should you arrive 10 or more minutes late.  We strive to give you quality time with our providers, and arriving late affects you and other patients whose appointments are after yours.  Also, if you no show three or more times for appointments you may be dismissed from the clinic at the providers discretion.     Again, thank you for choosing [facility].  Our hope is that these requests will decrease the amount of time that you wait before being seen by our physicians.       _____________________________________________________________  Should you have questions after your visit to [facility], please contact our office at [phone] between the hours of 8:00 a.m. and 4:30 p.m.  Voicemails left after 4:00 p.m. will not be returned until the following business day.  For prescription refill requests, have your pharmacy contact our office and allow 72 hours.    Cancer Center Support Programs:   > Cancer Support Group  2nd Tuesday of the month 1pm-2pm, Journey Room   

## 2018-07-14 NOTE — Progress Notes (Signed)
Diagnosis Polycythemia vera ([place]) - Plan: CBC with Differential/Platelet, Comprehensive metabolic panel, Lactate dehydrogenase, hydroxyurea (HYDREA) 500 MG capsule  Staging Cancer Staging No matching staging information was found for the patient.  Assessment and Plan:  1.  P Vera.  77 y.o. Female followed by Dr. Talbert Cage. 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    Labs done 07/07/2018 reviewed and showed WBC 8.3 Hb 15.1 plts 215,000.  MCV elevated at 108 due to hydrea.  Chemistries WNL with K+ 4 Cr 0.87 and normal LFTs.  Pt should continue Hydrea 1000 mg daily.  She will have repeat labs and  follow-up in 11/2018.    2.  Anemia.  In 03/2018 pt had microcytosis with MCV 74 and HB 8.3.  Pt was treated with IV iron on 8/20/ 2019 and 03/30/2018.  MCV is improved after IV iron.  SPEP, HB electrophoresis and thalassemia genotype study were normal.  Lab done 07/07/2018 showed HB 15.  Will repeat labs in 11/2018.    3.   Right toe amputation.  Follow-up with surgery as directed.   4.  Atrial fibrillation.  Patient is on Coumadin.  Recent INR 2.6 on 07/14/2018.   Follow-up with PCP for monitoring.   5  Hypertension.  Blood pressure is 133/68.  Follow-up with PCP.  6.  SOB.  This is chronic.  Pt on home O2.  Pulse ox is 91% on 2 L O2.  Follow-up with PCP or pulmonary as directed    Interval history.  Historical data obtained from note dated 11/11/2017.  Patient was previously followed by Dr. Talbert Cage  for Jak 2+ myeloproliferative disorder.  Patient was previously seen by vascular surgery and was diagnosed with dry gangrene and they say that her bypass graft in her right lower extremity was patent.  They recommended outpatient follow-up for her dry gangrene wounds.  She had a right lower extremity venous ultrasound performed which was negative for DVT on 06/10/2017.  Her hemoglobin dropped down to 7.2 g/dL while inpatient, and she was given 2 units PRBC  transfusion in 06/2017.  She continues to take Hydrea.    Current Status: Patient is seen today for follow-up.  She is tolerating hydrea without problems.  She is here to go over labs.    Problem List Patient Active Problem List   Diagnosis Date Noted  . Iron deficiency anemia due to chronic blood loss [D50.0] 03/18/2018  . Group B streptococcal infection [A49.1] 12/15/2017  . Open wound of right foot [S91.301A] 12/15/2017  . Right groin wound [S31.109A] 12/11/2017  . CKD (chronic kidney disease) stage 3, GFR 30-59 ml/min (HCC) [N18.3]   . Foot pain, right [M79.671]   . Cellulitis and abscess of right leg [L03.115, L02.415] 06/27/2017  . 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 Hypokalemia [E87.6] 06/27/2017  . Osteomyelitis of right foot ([place]) [M86.9] 06/27/2017  . S/P femoral-popliteal bypass surgery [Z95.828]   . Edema of right lower extremity [R60.0] 06/08/2017  . GERD (gastroesophageal reflux disease) [K21.9] 06/08/2017  . Pressure ulcer [L89.90] 05/29/2017  . Sepsis ([place]) [A41.9] 05/28/2017  . Hypotension [I95.9] 05/28/2017  . Pressure injury of skin [L89.90] 03/14/2017  . Acute renal failure (ARF) ([place]) [N17.9]   . Hyperlipidemia [E78.5] 03/05/2017  . Lower extremity arterial insufficiency, severe, right ([place]) [I73.9] 03/04/2017  . Chronic respiratory failure with hypoxia ([place]) [J96.11] 02/12/2017  . Cellulitis [L03.90] 07/03/2016  . Anemia [D64.9] 07/03/2016  . Cough [R05] 11/29/2015  . Hypoxemia [R09.02]  11/29/2015  . CHF (congestive heart failure) ([place]) [I50.9] 11/29/2015  . Acute on chronic respiratory failure with hypoxia (HCC) [J96.21]   . Pleural effusion on left [J90]   . Acute on chronic diastolic (congestive) heart failure ([place]) [I50.33]   . Dyspnea [R06.00] 11/22/2014  . Pleural effusion [J90] 11/22/2014  . Hard of hearing [H91.90] 11/22/2014  . Thrombocytosis ([place]) [D47.3] 11/22/2014  . Acute respiratory failure with hypoxemia ([place]) [J96.01] 11/22/2014  .  Encounter for therapeutic drug monitoring [Z51.81] 08/31/2013  . Aftercare following surgery of the circulatory system, [place] [Z48.812] 12/21/2012  . Pain in limb [M79.609] 10/19/2012  . PVD (peripheral vascular disease) ([place]) [I73.9] 09/24/2012  . Swollen leg [M79.89] 09/24/2012  . Varicose veins of lower extremities with other complications [X91.478] 29/56/2130  . 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 Wound infection after surgery [T81.49XA] 01/07/2012  . Compartment syndrome, nontraumatic, lower extremity [M79.A29] 11/29/2011  . Pain of right thigh [M79.651] 11/28/2011  . Hip fracture, right ([place]) [S72.001A] 11/15/2011  . Chronic atrial fibrillation [I48.20] 11/15/2011  . Anticoagulant long-term use [Z79.01] 11/15/2011  . HTN (hypertension) [I10] 11/15/2011  . Hypothyroidism [E03.9] 11/15/2011  . Polycythemia vera ([place]) [D45] 10/01/2011    Past Medical History Past Medical History:  Diagnosis Date  . Anxiety   . Arthritis   . Atrial fibrillation, chronic    a. on Coumadin for anticoagulation.   . Cellulitis 06/29/2017   RIGHT LOWER LEG  . CHF (congestive heart failure) ([place])    combined  . COPD (chronic obstructive pulmonary disease) ([place])    on home O2 4 lpm  . Deaf   . Dry gangrene ([place])    R foot  . DVT (deep venous thrombosis) ([place])   . Essential hypertension   . GERD (gastroesophageal reflux disease)   . HOH (hard of hearing)   . Hyperlipidemia   . Hypothyroidism   . Iron deficiency anemia due to chronic blood loss 03/18/2018  . Open wound    right groin  . Oxygen dependent    4 lpm  . Peripheral neuropathy   . Peripheral vascular disease ([place])   . 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 Ulcer of ankle ([place])    Bilateral 2015  . Varicose veins     Past Surgical History Past Surgical History:  Procedure Laterality Date  . ABDOMINAL AORTAGRAM N/A  09/14/2012   Procedure: ABDOMINAL [name];  Surgeon: Serafina Mitchell, MD;  [facility] C[facility] LAB;  Service: Cardiovascular;  Laterality: N/A;  . ABDOMINAL AORTOGRAM N/A 03/06/2017   Procedure: ABDOMINAL AORTOGRAM;  Surgeon: Elam Dutch, MD;  Location: [place] CV LAB;  Service: Cardiovascular;  Laterality: N/A;  . ABDOMINAL AORTOGRAM W/LOWER EXTREMITY N/A 09/21/2017   Procedure: ABDOMINAL AORTOGRAM W/LOWER EXTREMITY;  Surgeon: Waynetta Sandy, MD;  Location: [place] CV LAB;  Service: Cardiovascular;  Laterality: N/A;  . ABDOMINAL HYSTERECTOMY    . AMPUTATION Right 09/29/2017   Procedure: DEBRIDEMENT TOE AMPUTATION RIGHT FIFTH TOE;  Surgeon: Angelia Mould, MD;  Location: [place];  Service: Vascular;  Laterality: Right;  . APPLICATION OF WOUND VAC Right 09/29/2017   Procedure: APPLICATION OF WOUND VAC ON RIGHT FOOT;  Surgeon: Angelia Mould, MD;  Location: [place];  Service: Vascular;  Laterality: Right;  . CATARACT EXTRACTION W/PHACO Right 08/31/2017   Procedure: CATARACT EXTRACTION PHACO AND INTRAOCULAR LENS PLACEMENT RIGHT EYE;  Surgeon: Tonny Branch, MD;  Location: [facility];  Service: Ophthalmology;  Laterality: Right;  CDE: 13.27  . CATARACT EXTRACTION W/PHACO Left 09/14/2017   Procedure: CATARACT EXTRACTION PHACO AND INTRAOCULAR LENS PLACEMENT LEFT EYE;  Surgeon: Tonny Branch, MD;  Location: [facility];  Service: Ophthalmology;  Laterality: Left;  CDE: 13.02  . CORONARY ANGIOPLASTY    . DRESSING CHANGE UNDER ANESTHESIA  12/02/2011   Procedure: DRESSING CHANGE UNDER ANESTHESIA;  Surgeon: Carole Civil, MD;  Location: [facility];  Service: Orthopedics;  Laterality: Right;  . ENDARTERECTOMY FEMORAL Right 09/15/2012   Procedure: ENDARTERECTOMY FEMORAL;  Surgeon: Mal Misty, MD;  Location: [place];  Service: Vascular;  Laterality: Right;  . FASCIOTOMY  11/29/2011   Procedure: FASCIOTOMY;  Surgeon: Carole Civil, MD;  Location: [facility];  Service: Orthopedics;  Laterality:  Right;  right thigh   . FEMORAL-POPLITEAL BYPASS GRAFT Right 09/15/2012   Procedure: BYPASS GRAFT FEMORAL-POPLITEAL ARTERY;  Surgeon: Mal Misty, MD;  Location: [place];  Service: Vascular;  Laterality: Right;  . FEMORAL-TIBIAL BYPASS GRAFT Right 03/11/2017   Procedure: RIGHT FEMORAL-TIBIAL BYPASS IN-SITU GREATER SAPHENOUS VEIN;  Surgeon: Waynetta Sandy, MD;  Location: [place];  Service: Vascular;  Laterality: Right;  . GROIN DEBRIDEMENT Right 12/10/2017   Procedure: GROIN DEBRIDEMENT EXPLORATION;  Surgeon: Waynetta Sandy, MD;  Location: [place];  Service: Vascular;  Laterality: Right;  . HIP PINNING,CANNULATED  11/19/2011   Procedure: CANNULATED HIP PINNING;  Surgeon: Sanjuana Kava, MD;  Location: [facility];  Service: Orthopedics;  Laterality: Right;  . LOWER EXTREMITY ANGIOGRAPHY Bilateral 03/06/2017   Procedure: Lower Extremity Angiography;  Surgeon: Elam Dutch, MD;  Location: [place] CV LAB;  Service: Cardiovascular;  Laterality: Bilateral;  . PATCH ANGIOPLASTY Right 09/15/2012   Procedure: PATCH ANGIOPLASTY;  Surgeon: Mal Misty, MD;  Location: [place];  Service: Vascular;  Laterality: Right;  . TUBAL LIGATION      Family History Family History  Problem Relation Age of Onset  . Heart failure Mother   . Heart disease Mother   . Stroke Father   . Heart disease Sister   . Diabetes Son   . Hypertension Sister   . Hypertension Sister   . Stroke Sister      Social History  reports that she has never smoked. She has never used smokeless tobacco. She reports that she does not drink alcohol or use drugs.  Medications  Current Outpatient Medications:  .  aspirin EC 81 MG EC tablet, Take 1 tablet (81 mg total) by mouth daily., Disp: , Rfl:  .  CARTIA XT 240 MG 24 hr capsule, TAKE 1 CAPSULE BY MOUTH ONCE DAILY, Disp: 90 capsule, Rfl: 3 .  cephALEXin (KEFLEX) 500 MG capsule, Take 1 capsule (500 mg total) by mouth 3 (three) times daily., Disp: 21 capsule, Rfl: 0 .   diltiazem (CARDIZEM CD) 240 MG 24 hr capsule, Take 240 mg by mouth daily., Disp: , Rfl:  .  gabapentin (NEURONTIN) 100 MG capsule, Take 100 mg by mouth 3 (three) times daily., Disp: , Rfl: 2 .  hydroxyurea (HYDREA) 500 MG capsule, Take 2 capsules (1,000 mg total) by mouth daily. May take with food to minimize GI side effects., Disp: 180 capsule, Rfl: 3 .  levothyroxine (SYNTHROID, LEVOTHROID) 125 MCG tablet, Take 125 mcg by mouth daily before breakfast. , Disp: , Rfl: 1 .  pravastatin (PRAVACHOL) 20 MG tablet, Take 10 mg by mouth daily., Disp: , Rfl:  .  torsemide (DEMADEX) 20 MG tablet,  TAKE 1 TABLET BY MOUTH ONCE DAILY AS NEEDED MAY TAKE FOR WEIGHT GAIN OF 3 LBS OVER 24 HOURS, Disp: , Rfl: 11 .  traMADol (ULTRAM) 50 MG tablet, Take 1 tablet (50 mg total) by mouth every 6 (six) hours as needed., Disp: 20 tablet, Rfl: 0 .  warfarin (COUMADIN) 5 MG tablet, Take 0.5-1 tablets (2.5-5 mg total) by mouth See admin instructions. 5 mg on Monday, all other days 2.5 mg, Disp: , Rfl:   Allergies Patient has no known allergies.  Review of Systems Review of Systems - Oncology ROS negative other than SOB.     Physical Exam  Vitals Wt Readings from Last 3 Encounters:  07/14/18 146 lb 8 oz (66.5 kg)  06/18/18 146 lb 8 oz (66.5 kg)  05/17/18 148 lb (67.1 kg)   Temp Readings from Last 3 Encounters:  07/14/18 97.7 F (36.5 C) (Oral)  06/18/18 (!) 97.4 F (36.3 C) (Oral)  05/17/18 97.6 F (36.4 C) (Oral)   BP Readings from Last 3 Encounters:  07/14/18 133/68  06/18/18 (!) 148/94  05/17/18 139/80   Pulse Readings from Last 3 Encounters:  07/14/18 66  06/18/18 84  05/17/18 90    Constitutional: Well-developed, well-nourished, and in no distress.  Seated in wheelchair.   HENT: Head: Normocephalic and atraumatic.  Mouth/Throat: No oropharyngeal exudate. Mucosa moist. Eyes: Pupils are equal, round, and reactive to light. Conjunctivae are normal. No scleral icterus.  Neck: Normal range of  motion. Neck supple. No JVD present.  Cardiovascular: Normal rate, regular rhythm and normal heart sounds.  Exam reveals no gallop and no friction rub.   No murmur heard. Pulmonary/Chest: Effort normal and breath sounds normal. No respiratory distress. No wheezes.No rales.  Abdominal: Soft. Bowel sounds are normal. No distension. There is no tenderness. There is no guarding.  Musculoskeletal: No edema or tenderness.  Lymphadenopathy: No cervical, axillary or supraclavicular adenopathy.  Neurological: Alert and oriented to person, place, and time. No cranial nerve deficit.  Skin: Skin is warm and dry. No rash noted. No erythema. No pallor.  Psychiatric: Affect and judgment normal.   Labs Anti-coag visit on 07/14/2018  Component Date Value Ref Range Status  . INR 07/14/2018 2.6  2.0 - 3.0 Final     Pathology Orders Placed This Encounter  Procedures  . CBC with Differential/Platelet    Standing Status:   Future    Standing Expiration Date:   07/14/2020  . Comprehensive metabolic panel    Standing Status:   Future    Standing Expiration Date:   07/14/2020  . Lactate dehydrogenase    Standing Status:   Future    Standing Expiration Date:   07/14/2020       Zoila Shutter MD

## 2018-08-11 DIAGNOSIS — G894 Chronic pain syndrome: Secondary | ICD-10-CM | POA: Diagnosis not present

## 2018-08-11 DIAGNOSIS — Z6820 Body mass index (BMI) 20.0-20.9, adult: Secondary | ICD-10-CM | POA: Diagnosis not present

## 2018-08-16 ENCOUNTER — Encounter: Payer: Self-pay | Admitting: Cardiovascular Disease

## 2018-08-16 ENCOUNTER — Ambulatory Visit: Payer: Medicare HMO | Admitting: Cardiovascular Disease

## 2018-08-16 ENCOUNTER — Ambulatory Visit (INDEPENDENT_AMBULATORY_CARE_PROVIDER_SITE_OTHER): Payer: Medicare HMO | Admitting: Pharmacist

## 2018-08-16 VITALS — BP 110/70 | HR 80 | Ht 72.0 in | Wt 153.0 lb

## 2018-08-16 DIAGNOSIS — Z5181 Encounter for therapeutic drug level monitoring: Secondary | ICD-10-CM

## 2018-08-16 DIAGNOSIS — E785 Hyperlipidemia, unspecified: Secondary | ICD-10-CM

## 2018-08-16 DIAGNOSIS — I482 Chronic atrial fibrillation, unspecified: Secondary | ICD-10-CM

## 2018-08-16 DIAGNOSIS — I739 Peripheral vascular disease, unspecified: Secondary | ICD-10-CM

## 2018-08-16 DIAGNOSIS — I5032 Chronic diastolic (congestive) heart failure: Secondary | ICD-10-CM

## 2018-08-16 DIAGNOSIS — I4821 Permanent atrial fibrillation: Secondary | ICD-10-CM

## 2018-08-16 DIAGNOSIS — I10 Essential (primary) hypertension: Secondary | ICD-10-CM | POA: Diagnosis not present

## 2018-08-16 LAB — POCT INR: INR: 1.9 — AB (ref 2.0–3.0)

## 2018-08-16 NOTE — Patient Instructions (Signed)
Description   Take 7.5mg  today then continue coumadin 2.5mg  daily except 5mg  on Mondays and Thursdays Recheck in 5 weeks

## 2018-08-16 NOTE — Patient Instructions (Signed)
Medication Instructions:  Your physician recommends that you continue on your current medications as directed. Please refer to the Current Medication list given to you today.  If you need a refill on your cardiac medications before your next appointment, please call your pharmacy.   Lab work: none If you have labs (blood work) drawn today and your tests are completely normal, you will receive your results only by: Marland Kitchen MyChart Message (if you have MyChart) OR . A paper copy in the mail If you have any lab test that is abnormal or we need to change your treatment, we will call you to review the results.  Testing/Procedures: None  Follow-Up: At [facility], you and your health needs are our priority.  As part of our continuing mission to provide you with exceptional heart care, we have created designated Provider Care Teams.  These Care Teams include your primary Cardiologist (physician) and Advanced Practice Providers (APPs -  Physician Assistants and Nurse Practitioners) who all work together to provide you with the care you need, when you need it. You will need a follow up appointment in 6 months.  Please call our office 2 months in advance to schedule this appointment.  You may see Kate Sable, MD or one of the following Advanced Practice Providers on your designated Care Team:   Bernerd Pho, PA-C [facility]) . Ermalinda Barrios, PA-C ([place])  Any Other Special Instructions Will Be Listed Below (If Applicable). None

## 2018-08-16 NOTE — Progress Notes (Signed)
SUBJECTIVE: The patient presents for routine follow-up.  I last saw her in February 2018.  She has a history of permanent atrial fibrillation, chronic diastolic heart failure, peripheral vascular disease, polycythemia vera, and hypertension.  Echocardiogram 03/24/2017 demonstrated normal left ventricular systolic function, LVEF 60 to 65%, right ventricular volume and pressure overload with moderately dilated right ventricle and moderately decreased systolic function, severe biatrial enlargement, and moderate pulmonary hypertension.  She is here with her husband, [name].  She uses 4 L of oxygen around-the-clock.  She denies chest pain.  Chronic exertional dyspnea is stable.  She uses torsemide about twice per week.  She has occasional palpitations.  Her appetite is good.    Review of Systems: As per "subjective", otherwise negative.  No Known Allergies  Current Outpatient Medications  Medication Sig Dispense Refill  . aspirin EC 81 MG EC tablet Take 1 tablet (81 mg total) by mouth daily.    Marland Kitchen CARTIA XT 240 MG 24 hr capsule TAKE 1 CAPSULE BY MOUTH ONCE DAILY 90 capsule 3  . cephALEXin (KEFLEX) 500 MG capsule Take 1 capsule (500 mg total) by mouth 3 (three) times daily. 21 capsule 0  . diltiazem (CARDIZEM CD) 240 MG 24 hr capsule Take 240 mg by mouth daily.    Marland Kitchen gabapentin (NEURONTIN) 100 MG capsule Take 100 mg by mouth 3 (three) times daily.  2  . hydroxyurea (HYDREA) 500 MG capsule Take 2 capsules (1,000 mg total) by mouth daily. May take with food to minimize GI side effects. 180 capsule 3  . levothyroxine (SYNTHROID, LEVOTHROID) 125 MCG tablet Take 125 mcg by mouth daily before breakfast.   1  . pravastatin (PRAVACHOL) 20 MG tablet Take 10 mg by mouth daily.    Marland Kitchen torsemide (DEMADEX) 20 MG tablet TAKE 1 TABLET BY MOUTH ONCE DAILY AS NEEDED MAY TAKE FOR WEIGHT GAIN OF 3 LBS OVER 24 HOURS  11  . traMADol (ULTRAM) 50 MG tablet Take 1 tablet (50 mg total) by mouth every 6 (six) hours as  needed. 20 tablet 0  . warfarin (COUMADIN) 5 MG tablet Take 0.5-1 tablets (2.5-5 mg total) by mouth See admin instructions. 5 mg on Monday, all other days 2.5 mg     No current facility-administered medications for this visit.     Past Medical History:  Diagnosis Date  . Anxiety   . Arthritis   . Atrial fibrillation, chronic    a. on Coumadin for anticoagulation.   . Cellulitis 06/29/2017   RIGHT LOWER LEG  . CHF (congestive heart failure) ([place])    combined  . COPD (chronic obstructive pulmonary disease) ([place])    on home O2 4 lpm  . Deaf   . 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 DVT (deep venous thrombosis) ([place])   . Essential hypertension   . GERD (gastroesophageal reflux disease)   . HOH (hard of hearing)   . Hyperlipidemia   . Hypothyroidism   . Iron deficiency anemia due to chronic blood loss 03/18/2018  . Open wound    right groin  . Oxygen dependent    4 lpm  . Peripheral neuropathy   . Peripheral vascular disease ([place])   . 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 Ulcer of ankle ([place])    Bilateral 2015  . Varicose veins     Past Surgical History:  Procedure Laterality Date  . ABDOMINAL AORTAGRAM N/A 09/14/2012   Procedure: ABDOMINAL [name];  Surgeon: Serafina Mitchell, MD;  Location: [facility];  Service: Cardiovascular;  Laterality: N/A;  . ABDOMINAL AORTOGRAM N/A 03/06/2017   Procedure: ABDOMINAL AORTOGRAM;  Surgeon: Elam Dutch, MD;  Location: [facility];  Service: Cardiovascular;  Laterality: N/A;  . ABDOMINAL AORTOGRAM W/LOWER EXTREMITY N/A 09/21/2017   Procedure: ABDOMINAL AORTOGRAM W/LOWER EXTREMITY;  Surgeon: Waynetta Sandy, MD;  Location: [facility];  Service: Cardiovascular;  Laterality: N/A;  . ABDOMINAL HYSTERECTOMY    . AMPUTATION Right 09/29/2017   Procedure: DEBRIDEMENT TOE AMPUTATION RIGHT FIFTH TOE;  Surgeon: Angelia Mould, MD;  Location: [facility];  Service: Vascular;  Laterality: Right;  . APPLICATION OF WOUND VAC  Right 09/29/2017   Procedure: APPLICATION OF WOUND VAC ON RIGHT FOOT;  Surgeon: Angelia Mould, MD;  Location: [facility];  Service: Vascular;  Laterality: Right;  . CATARACT EXTRACTION W/PHACO Right 08/31/2017   Procedure: CATARACT EXTRACTION PHACO AND INTRAOCULAR LENS PLACEMENT RIGHT EYE;  Surgeon: Tonny Branch, MD;  Location: [facility];  Service: Ophthalmology;  Laterality: Right;  CDE: 13.27  . CATARACT EXTRACTION W/PHACO Left 09/14/2017   Procedure: CATARACT EXTRACTION PHACO AND INTRAOCULAR LENS PLACEMENT LEFT EYE;  Surgeon: Tonny Branch, MD;  Location: [facility];  Service: Ophthalmology;  Laterality: Left;  CDE: 13.02  . CORONARY ANGIOPLASTY    . DRESSING CHANGE UNDER ANESTHESIA  12/02/2011   Procedure: DRESSING CHANGE UNDER ANESTHESIA;  Surgeon: Carole Civil, MD;  Location: [facility];  Service: Orthopedics;  Laterality: Right;  . ENDARTERECTOMY FEMORAL Right 09/15/2012   Procedure: ENDARTERECTOMY FEMORAL;  Surgeon: Mal Misty, MD;  Location: [facility];  Service: Vascular;  Laterality: Right;  . FASCIOTOMY  11/29/2011   Procedure: FASCIOTOMY;  Surgeon: Carole Civil, MD;  Location: [facility];  Service: Orthopedics;  Laterality: Right;  right thigh   . FEMORAL-POPLITEAL BYPASS GRAFT Right 09/15/2012   Procedure: BYPASS GRAFT FEMORAL-POPLITEAL ARTERY;  Surgeon: Mal Misty, MD;  Location: [facility];  Service: Vascular;  Laterality: Right;  . FEMORAL-TIBIAL BYPASS GRAFT Right 03/11/2017   Procedure: RIGHT FEMORAL-TIBIAL BYPASS IN-SITU GREATER SAPHENOUS VEIN;  Surgeon: Waynetta Sandy, MD;  Location: [facility];  Service: Vascular;  Laterality: Right;  . GROIN DEBRIDEMENT Right 12/10/2017   Procedure: GROIN DEBRIDEMENT EXPLORATION;  Surgeon: Waynetta Sandy, MD;  Location: [facility];  Service: Vascular;  Laterality: Right;  . HIP PINNING,CANNULATED  11/19/2011   Procedure: CANNULATED HIP PINNING;  Surgeon: Sanjuana Kava, MD;  Location: [facility];  Service: Orthopedics;  Laterality: Right;  . LOWER  EXTREMITY ANGIOGRAPHY Bilateral 03/06/2017   Procedure: Lower Extremity Angiography;  Surgeon: Elam Dutch, MD;  Location: [facility];  Service: Cardiovascular;  Laterality: Bilateral;  . PATCH ANGIOPLASTY Right 09/15/2012   Procedure: PATCH ANGIOPLASTY;  Surgeon: Mal Misty, MD;  Location: [facility];  Service: Vascular;  Laterality: Right;  . TUBAL LIGATION      Social History   Socioeconomic History  . Marital status: Married    Spouse name: Not on file  . Number of children: Not on file  . Years of education: Not on file  . Highest education level: Not on file  Occupational History  . Not on file  Social Needs  . Financial resource strain: Not on file  . Food insecurity:    Worry: Not on file    Inability: Not on file  . Transportation needs:    Medical: Not on file    Non-medical: Not on file  Tobacco Use  . Smoking  status: Never Smoker  . Smokeless tobacco: Never Used  Substance and Sexual Activity  . Alcohol use: No    Alcohol/week: 0.0 standard drinks  . Drug use: No  . Sexual activity: Not Currently  Lifestyle  . Physical activity:    Days per week: Not on file    Minutes per session: Not on file  . Stress: Not on file  Relationships  . Social connections:    Talks on phone: Not on file    Gets together: Not on file    Attends religious service: Not on file    Active member of club or organization: Not on file    Attends meetings of clubs or organizations: Not on file    Relationship status: Not on file  . Intimate partner violence:    Fear of current or ex partner: Not on file    Emotionally abused: Not on file    Physically abused: Not on file    Forced sexual activity: Not on file  Other Topics Concern  . Not on file  Social History Narrative  . Not on file     Vitals:   08/16/18 1423  BP: 110/70  Pulse: 80  SpO2: 92%  Weight: 153 lb (69.4 kg)  Height: 6' (1.829 m)    Wt Readings from Last 3 Encounters:  08/16/18 153 lb (69.4  kg)  07/14/18 146 lb 8 oz (66.5 kg)  06/18/18 146 lb 8 oz (66.5 kg)     PHYSICAL EXAM General: NAD, using oxygen by nasal cannula HEENT: Normal. Neck: No JVD, no thyromegaly. Lungs: No crackles or wheezes CV: Regular rate and irregular rhythm, normal S1/S2, no S3, no murmur.  Trace bilateral lower extremity edema.  No carotid bruit.   Abdomen: Soft, nontender, no distention.  Neurologic: Alert and oriented.  Psych: Normal affect. Skin: Normal. Musculoskeletal: No gross deformities.    ECG: Reviewed above under Subjective   Labs: Lab Results  Component Value Date/Time   K 4.0 07/07/2018 11:56 AM   BUN 13 07/07/2018 11:56 AM   CREATININE 0.87 07/07/2018 11:56 AM   CREATININE 1.21 (H) 08/14/2016 07:33 AM   ALT 8 07/07/2018 11:56 AM   TSH 11.084 (H) 05/28/2017 09:51 PM   TSH 1.984 11/22/2014 06:26 AM   HGB 15.1 (H) 07/07/2018 11:56 AM     Lipids: Lab Results  Component Value Date/Time   [facility]  07/19/2009 04:48 AM    62        Total Cholesterol/HDL:CHD Risk Coronary Heart Disease Risk Table                     Men   Women  1/2 Average Risk   3.4   3.3  Average Risk       5.0   4.4  2 X Average Risk   9.6   7.1  3 X Average Risk  23.4   11.0        Use the calculated Patient Ratio above and the CHD Risk Table to determine the patient's CHD Risk.        ATP III CLASSIFICATION (LDL):  <100     mg/dL   Optimal  100-129  mg/dL   Near or Above                    Optimal  130-159  mg/dL   Borderline  160-189  mg/dL   High  >190     mg/dL   Very  High   CHOL  07/19/2009 04:48 AM    119        ATP III CLASSIFICATION:  <200     mg/dL   Desirable  200-239  mg/dL   Borderline High  >=240    mg/dL   High          TRIG 124 07/19/2009 04:48 AM   HDL 32 (L) 07/19/2009 04:48 AM       ASSESSMENT AND PLAN: 1.  Permanent atrial fibrillation: Symptomatically stable.  Heart rate is controlled on long-acting diltiazem.  Anticoagulated with warfarin.  INR slightly  subtherapeutic at 1.9 today.  Followed in our anticoagulation clinic.  2.  Chronic diastolic heart failure: She appears euvolemic.  She takes torsemide as needed, usually about twice per week.  Blood pressure is normal.  3.  Hypertension: Blood pressure is controlled.  No change to therapy.  4.  Peripheral vascular disease: Follows with vascular surgery.  On aspirin and statin therapy.  5.  Hyperlipidemia: On pravastatin.   Disposition: Follow up 6 months   Kate Sable, M.D., F.A.C.C.

## 2018-09-20 ENCOUNTER — Ambulatory Visit (INDEPENDENT_AMBULATORY_CARE_PROVIDER_SITE_OTHER): Payer: Medicare HMO | Admitting: Pharmacist

## 2018-09-20 DIAGNOSIS — Z5181 Encounter for therapeutic drug level monitoring: Secondary | ICD-10-CM

## 2018-09-20 DIAGNOSIS — I482 Chronic atrial fibrillation, unspecified: Secondary | ICD-10-CM

## 2018-09-20 LAB — POCT INR: INR: 1.6 — AB (ref 2.0–3.0)

## 2018-09-20 NOTE — Patient Instructions (Signed)
Description   Take 7.5mg  today then change dose to coumadin 2.5mg  daily except 5mg  on Mondays Wednesdays and Fridays Recheck in 4 weeks

## 2018-10-05 DIAGNOSIS — I10 Essential (primary) hypertension: Secondary | ICD-10-CM | POA: Diagnosis not present

## 2018-10-05 DIAGNOSIS — E7849 Other hyperlipidemia: Secondary | ICD-10-CM | POA: Diagnosis not present

## 2018-10-05 DIAGNOSIS — D45 Polycythemia vera: Secondary | ICD-10-CM | POA: Diagnosis not present

## 2018-10-05 DIAGNOSIS — Z1389 Encounter for screening for other disorder: Secondary | ICD-10-CM | POA: Diagnosis not present

## 2018-10-05 DIAGNOSIS — E039 Hypothyroidism, unspecified: Secondary | ICD-10-CM | POA: Diagnosis not present

## 2018-10-05 DIAGNOSIS — Z6820 Body mass index (BMI) 20.0-20.9, adult: Secondary | ICD-10-CM | POA: Diagnosis not present

## 2018-10-05 DIAGNOSIS — I4891 Unspecified atrial fibrillation: Secondary | ICD-10-CM | POA: Diagnosis not present

## 2018-10-05 DIAGNOSIS — I5030 Unspecified diastolic (congestive) heart failure: Secondary | ICD-10-CM | POA: Diagnosis not present

## 2018-10-05 DIAGNOSIS — D649 Anemia, unspecified: Secondary | ICD-10-CM | POA: Diagnosis not present

## 2018-10-21 ENCOUNTER — Telehealth: Payer: Self-pay | Admitting: *Deleted

## 2018-10-21 NOTE — Telephone Encounter (Signed)
1. Have you recently travelled abroad or to [place], [place], or [place]?  2. Do you currently have a fever?  3. Have you been in contact with someone that is currently pending confirmation of COVID19 testing or has been confirmed to have the COVID19 virus?  4. Are you currently experiencing fatigue or cough?  5. Are you currently experiencing new or worsening shortness of breath at rest or with minimal activity?  6. Have you been in contact with someone that was recently sick with fever/cough/fatigue?   **A score of 4 or more should result in cancellation of the pts cardiology appt  **A score of 2 should be provided a mask prior to admission into the lobby  **TRAVEL to a high risk area or contact with a confirmed case should stay at home, away from confirmed patient, monitor symptoms, and reach out to PCP for evisit, additional testing.   **ALL PTS WITH FEVER SHOULD BE REFERRED TO PCP FOR EVISIT  Pt. Advised that we are restricting visitors at this time and request that only patients present for check-in prior to their appointment. All other visitors should remain in their car. If necessary, only one visitor may come with the patient into the building. For everyone's safety, all patients and visitors entering our practice area should expect to be screened again prior to entering our waiting area.   CALLED PT TO CONFIRM APPT.  NO ANSWER.  NO VOICE MAIL.

## 2018-10-23 ENCOUNTER — Other Ambulatory Visit: Payer: Self-pay | Admitting: Cardiovascular Disease

## 2018-10-25 ENCOUNTER — Other Ambulatory Visit: Payer: Self-pay

## 2018-10-25 ENCOUNTER — Ambulatory Visit (INDEPENDENT_AMBULATORY_CARE_PROVIDER_SITE_OTHER): Payer: Medicare HMO | Admitting: *Deleted

## 2018-10-25 DIAGNOSIS — I482 Chronic atrial fibrillation, unspecified: Secondary | ICD-10-CM | POA: Diagnosis not present

## 2018-10-25 DIAGNOSIS — Z5181 Encounter for therapeutic drug level monitoring: Secondary | ICD-10-CM

## 2018-10-25 LAB — POCT INR: INR: 1.9 — AB (ref 2.0–3.0)

## 2018-10-25 NOTE — Patient Instructions (Signed)
Increase coumadin to 1 tablet daily except 1/2 tablet on Sundays, Tuesdays and Thursdays Recheck in 4 weeks

## 2018-10-26 DIAGNOSIS — D649 Anemia, unspecified: Secondary | ICD-10-CM | POA: Diagnosis not present

## 2018-10-29 DIAGNOSIS — I5040 Unspecified combined systolic (congestive) and diastolic (congestive) heart failure: Secondary | ICD-10-CM | POA: Diagnosis not present

## 2018-11-08 ENCOUNTER — Other Ambulatory Visit (HOSPITAL_COMMUNITY): Payer: Medicare HMO

## 2018-11-15 ENCOUNTER — Other Ambulatory Visit (HOSPITAL_COMMUNITY): Payer: Medicare HMO

## 2018-11-15 ENCOUNTER — Ambulatory Visit (HOSPITAL_COMMUNITY): Payer: Medicare HMO | Admitting: Hematology

## 2018-11-22 ENCOUNTER — Other Ambulatory Visit: Payer: Self-pay

## 2018-11-22 ENCOUNTER — Ambulatory Visit (HOSPITAL_COMMUNITY): Payer: Medicare HMO | Admitting: Hematology

## 2018-11-22 ENCOUNTER — Ambulatory Visit (INDEPENDENT_AMBULATORY_CARE_PROVIDER_SITE_OTHER): Payer: Medicare HMO | Admitting: *Deleted

## 2018-11-22 DIAGNOSIS — Z5181 Encounter for therapeutic drug level monitoring: Secondary | ICD-10-CM | POA: Diagnosis not present

## 2018-11-22 DIAGNOSIS — I482 Chronic atrial fibrillation, unspecified: Secondary | ICD-10-CM | POA: Diagnosis not present

## 2018-11-22 LAB — POCT INR: INR: 2.3 (ref 2.0–3.0)

## 2018-11-22 NOTE — Patient Instructions (Signed)
Continue coumadin 1 tablet daily except 1/2 tablet on Sundays, Tuesdays and Thursdays.  Recheck in 4 weeks 

## 2018-11-23 ENCOUNTER — Other Ambulatory Visit: Payer: Self-pay | Admitting: Cardiovascular Disease

## 2018-11-29 DIAGNOSIS — I5040 Unspecified combined systolic (congestive) and diastolic (congestive) heart failure: Secondary | ICD-10-CM | POA: Diagnosis not present

## 2018-12-20 ENCOUNTER — Other Ambulatory Visit: Payer: Self-pay

## 2018-12-20 ENCOUNTER — Ambulatory Visit (INDEPENDENT_AMBULATORY_CARE_PROVIDER_SITE_OTHER): Payer: Medicare HMO | Admitting: *Deleted

## 2018-12-20 DIAGNOSIS — Z5181 Encounter for therapeutic drug level monitoring: Secondary | ICD-10-CM | POA: Diagnosis not present

## 2018-12-20 DIAGNOSIS — I482 Chronic atrial fibrillation, unspecified: Secondary | ICD-10-CM | POA: Diagnosis not present

## 2018-12-20 LAB — POCT INR: INR: 2.8 (ref 2.0–3.0)

## 2018-12-20 NOTE — Patient Instructions (Signed)
Continue coumadin 1 tablet daily except 1/2 tablet on Sundays, Tuesdays and Thursdays.  Recheck in 4 weeks 

## 2018-12-28 ENCOUNTER — Encounter (HOSPITAL_COMMUNITY): Payer: Medicare HMO

## 2018-12-28 ENCOUNTER — Other Ambulatory Visit (HOSPITAL_COMMUNITY): Payer: Medicare HMO

## 2018-12-28 ENCOUNTER — Ambulatory Visit: Payer: Medicare HMO | Admitting: Family

## 2018-12-29 DIAGNOSIS — I5040 Unspecified combined systolic (congestive) and diastolic (congestive) heart failure: Secondary | ICD-10-CM | POA: Diagnosis not present

## 2018-12-30 ENCOUNTER — Other Ambulatory Visit: Payer: Self-pay

## 2018-12-30 DIAGNOSIS — I739 Peripheral vascular disease, unspecified: Secondary | ICD-10-CM

## 2018-12-31 ENCOUNTER — Ambulatory Visit (INDEPENDENT_AMBULATORY_CARE_PROVIDER_SITE_OTHER)
Admission: RE | Admit: 2018-12-31 | Discharge: 2018-12-31 | Disposition: A | Discharge status: Home / Self Care | Payer: Medicare HMO | Source: Ambulatory Visit | Attending: Family | Admitting: Family

## 2018-12-31 ENCOUNTER — Ambulatory Visit (HOSPITAL_COMMUNITY)
Admission: RE | Admit: 2018-12-31 | Discharge: 2018-12-31 | Disposition: A | Discharge status: Home / Self Care | Payer: Medicare HMO | Source: Ambulatory Visit | Attending: Family | Admitting: Family

## 2018-12-31 ENCOUNTER — Ambulatory Visit: Payer: Medicare HMO | Admitting: Family

## 2018-12-31 ENCOUNTER — Other Ambulatory Visit: Payer: Self-pay

## 2018-12-31 ENCOUNTER — Encounter: Payer: Self-pay | Admitting: Family

## 2018-12-31 VITALS — BP 102/63 | HR 84 | Temp 98.3°F | Resp 14

## 2018-12-31 DIAGNOSIS — Z7901 Long term (current) use of anticoagulants: Secondary | ICD-10-CM | POA: Diagnosis not present

## 2018-12-31 DIAGNOSIS — I779 Disorder of arteries and arterioles, unspecified: Secondary | ICD-10-CM | POA: Diagnosis not present

## 2018-12-31 DIAGNOSIS — R609 Edema, unspecified: Secondary | ICD-10-CM

## 2018-12-31 DIAGNOSIS — I739 Peripheral vascular disease, unspecified: Secondary | ICD-10-CM | POA: Diagnosis not present

## 2018-12-31 DIAGNOSIS — Z95828 Presence of other vascular implants and grafts: Secondary | ICD-10-CM | POA: Diagnosis not present

## 2018-12-31 DIAGNOSIS — D45 Polycythemia vera: Secondary | ICD-10-CM | POA: Diagnosis not present

## 2018-12-31 NOTE — Progress Notes (Signed)
[facility] [place]   CC: Follow up peripheral artery occlusive disease  History of Present Illness Charlotte [name] is a 78 y.o. female who is s/pdebridement of right groin to 7 x 1.5 x 2cm deep, placement of negative pressure dressingon 12-10-17 by Dr. [name] forright groin draining sinus tract.  She is alsos/p ray amputation of right fifth toe and placement of VACon 09-29-17 by Dr. Scot Dock for nonhealing wound right fifth toe. This has healed completely.   She has had several vascular procedures on her lower extremities, includingRight femoral endarterectomy with Dacron patch angioplasty and profundoplastyand right femoral popliteal bypass graft using 6 mm Gore-Tex (above-knee)on 09-15-12 by Dr. Kellie Simmering.  Dr. [name] evaluated pt on 01-08-18. At that timeright groin with fibrinous tissue Strong peroneal and DP signals and a palpable pulse on the right medial thigh. Dr. Angela Nevin silver nitrate on the right groin with a dry dressing. We can move away from wet to dries at that time and hopefully this will heal. Therewas underlying graft to the proximal anastomosis and this wound. The right foot appearedto be healing well with dry dressings as well. Pt was tofollow-up in 4 to 6 weeks. She completed antibiotics.  She currently has no wounds in her groins or lower extremities.   She returns today forfollow up.   Review of Systems: Shortness of breath with activity She has COPD and CHF, has supplemental O2 via [place].  Wound in right groinhas healedand right lateral foot site of small toe amputation has healed. New wound right 4th toe from recurrent abrasion inside shoe when foot rolls has also healed since her visit in August 2019. She has seen a podiatrist in [place] and has protective anti-roll boots made by [employer]. She has polycythemia vera.    Diabetic: No Tobacco use: non-smoker  Pt meds include: Statin :Yes Betablocker: No ASA:  Yes Other anticoagulants/antiplatelets: coumadin for chronic atrial fib.     Past Medical History:  Diagnosis Date  . Anxiety   . Arthritis   . Atrial fibrillation, chronic    a. on Coumadin for anticoagulation.   . Cellulitis 06/29/2017   RIGHT LOWER LEG  . CHF (congestive heart failure) ([place])    combined  . COPD (chronic obstructive pulmonary disease) ([place])    on home O2 4 lpm  . Deaf   . 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 DVT (deep venous thrombosis) ([place])   . Essential hypertension   . GERD (gastroesophageal reflux disease)   . HOH (hard of hearing)   . Hyperlipidemia   . Hypothyroidism   . Iron deficiency anemia due to chronic blood loss 03/18/2018  . Open wound    right groin  . Oxygen dependent    4 lpm  . Peripheral neuropathy   . Peripheral vascular disease ([place])   . 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 Ulcer of ankle ([place])    Bilateral 2015  . Varicose veins     Social History Social History   Tobacco Use  . Smoking status: Never Smoker  . Smokeless tobacco: Never Used  Substance Use Topics  . Alcohol use: No    Alcohol/week: 0.0 standard drinks  . Drug use: No    Family History Family History  Problem Relation Age of Onset  . Heart failure Mother   . Heart disease Mother   . Stroke Father   . Heart disease Sister   . Diabetes Son   . Hypertension Sister   .  Hypertension Sister   . Stroke Sister     Past Surgical History:  Procedure Laterality Date  . ABDOMINAL AORTAGRAM N/A 09/14/2012   Procedure: ABDOMINAL [name];  Surgeon: Serafina Mitchell, MD;  Location: [facility] C[facility];  Service: Cardiovascular;  Laterality: N/A;  . ABDOMINAL AORTOGRAM N/A 03/06/2017   Procedure: ABDOMINAL AORTOGRAM;  Surgeon: Elam Dutch, MD;  Location: [place] CV LAB;  Service: Cardiovascular;  Laterality: N/A;  . ABDOMINAL AORTOGRAM W/LOWER EXTREMITY N/A 09/21/2017   Procedure: ABDOMINAL AORTOGRAM W/LOWER EXTREMITY;  Surgeon: Waynetta Sandy, MD;   Location: [place] [facility];  Service: Cardiovascular;  Laterality: N/A;  . ABDOMINAL HYSTERECTOMY    . AMPUTATION Right 09/29/2017   Procedure: DEBRIDEMENT TOE AMPUTATION RIGHT FIFTH TOE;  Surgeon: Angelia Mould, MD;  Location: [place];  Service: Vascular;  Laterality: Right;  . APPLICATION OF WOUND VAC Right 09/29/2017   Procedure: APPLICATION OF WOUND VAC ON RIGHT FOOT;  Surgeon: Angelia Mould, MD;  Location: [place];  Service: Vascular;  Laterality: Right;  . CATARACT EXTRACTION W/PHACO Right 08/31/2017   Procedure: CATARACT EXTRACTION PHACO AND INTRAOCULAR LENS PLACEMENT RIGHT EYE;  Surgeon: Tonny Branch, MD;  Location: [facility];  Service: Ophthalmology;  Laterality: Right;  CDE: 13.27  . CATARACT EXTRACTION W/PHACO Left 09/14/2017   Procedure: CATARACT EXTRACTION PHACO AND INTRAOCULAR LENS PLACEMENT LEFT EYE;  Surgeon: Tonny Branch, MD;  Location: [facility];  Service: Ophthalmology;  Laterality: Left;  CDE: 13.02  . CORONARY ANGIOPLASTY    . DRESSING CHANGE UNDER ANESTHESIA  12/02/2011   Procedure: DRESSING CHANGE UNDER ANESTHESIA;  Surgeon: Carole Civil, MD;  Location: [facility];  Service: Orthopedics;  Laterality: Right;  . ENDARTERECTOMY FEMORAL Right 09/15/2012   Procedure: ENDARTERECTOMY FEMORAL;  Surgeon: Mal Misty, MD;  Location: [place];  Service: Vascular;  Laterality: Right;  . FASCIOTOMY  11/29/2011   Procedure: FASCIOTOMY;  Surgeon: Carole Civil, MD;  [facility]: [facility];  Service: Orthopedics;  Laterality: Right;  right thigh   . FEMORAL-POPLITEAL BYPASS GRAFT Right 09/15/2012   Procedure: BYPASS GRAFT FEMORAL-POPLITEAL ARTERY;  Surgeon: Mal Misty, MD;  Location: [place];  Service: Vascular;  Laterality: Right;  . FEMORAL-TIBIAL BYPASS GRAFT Right 03/11/2017   Procedure: RIGHT FEMORAL-TIBIAL BYPASS IN-SITU GREATER SAPHENOUS VEIN;  Surgeon: Waynetta Sandy, MD;  Location: [place];  Service: Vascular;  Laterality: Right;  . GROIN DEBRIDEMENT Right 12/10/2017    Procedure: GROIN DEBRIDEMENT EXPLORATION;  Surgeon: Waynetta Sandy, MD;  Location: [place];  Service: Vascular;  Laterality: Right;  . HIP PINNING,CANNULATED  11/19/2011   Procedure: CANNULATED HIP PINNING;  Surgeon: Sanjuana Kava, MD;  [facility]: [facility];  Service: Orthopedics;  Laterality: Right;  . LOWER EXTREMITY ANGIOGRAPHY Bilateral 03/06/2017   Procedure: Lower Extremity Angiography;  Surgeon: Elam Dutch, MD;  Location: [place] [facility];  Service: Cardiovascular;  Laterality: Bilateral;  . PATCH ANGIOPLASTY Right 09/15/2012   Procedure: PATCH ANGIOPLASTY;  Surgeon: Mal Misty, MD;  Location: [place];  Service: Vascular;  Laterality: Right;  . TUBAL LIGATION      No Known Allergies  Current Outpatient Medications  Medication Sig Dispense Refill  . aspirin EC 81 MG EC tablet Take 1 tablet (81 mg total) by mouth daily.    Marland Kitchen CARTIA XT 240 MG 24 hr capsule TAKE 1 CAPSULE BY MOUTH ONCE DAILY 90 capsule 3  . cephALEXin (KEFLEX) 500 MG capsule Take 1 capsule (500 mg total) by mouth 3 (three) times  daily. 21 capsule 0  . diltiazem (CARDIZEM CD) 240 MG 24 hr capsule Take 240 mg by mouth daily.    Marland Kitchen gabapentin (NEURONTIN) 100 MG capsule Take 100 mg by mouth 3 (three) times daily.  2  . hydroxyurea (HYDREA) 500 MG capsule Take 2 capsules (1,000 mg total) by mouth daily. May take with food to minimize GI side effects. 180 capsule 3  . levothyroxine (SYNTHROID, LEVOTHROID) 125 MCG tablet Take 125 mcg by mouth daily before breakfast.   1  . pravastatin (PRAVACHOL) 20 MG tablet Take 10 mg by mouth daily.    . traMADol (ULTRAM) 50 MG tablet Take 1 tablet (50 mg total) by mouth every 6 (six) hours as needed. 20 tablet 0  . warfarin (COUMADIN) 5 MG tablet Take 1 tablet daily except 1/2 tablet on Sundays, Tuesdays and Thursdays or as directed 71 tablet 2  . torsemide (DEMADEX) 20 MG tablet TAKE 1 TABLET BY MOUTH ONCE DAILY AS NEEDED **MAY  TAKE  FOR  WEIGHT  GAIN  OF  3  LBS  OVER  24   HOURS** 30 tablet 6   No current facility-administered medications for this visit.     ROS: See HPI for pertinent positives and negatives.   Physical Examination  Vitals:   12/31/18 1141  BP: 102/63  Pulse: 84  Resp: 14  Temp: 98.3 F (36.8 C)  TempSrc: Temporal  SpO2: 93%   There is no height or weight on file to calculate BMI.  General: A&O x 3, WDWN, frail elderly female. Gait: seated in w/c HENT: No gross abnormalities.  Eyes: Pupils are equal Pulmonary: Respirations are non labored, CTAB, good air movement in all fields. Supplemental O2at 2Lvia [place]  Cardiac: irregular rhythm with controlled rate, no detected murmur.         Carotid Bruits Right Left   Negative Negative   Radial pulses are 1+ palpable bilaterally   Adominal aortic pulse is not palpable                         VASCULAR EXAM: Extremities without ischemic changes, without Gangrene; without open wounds. 1-2+ pitting and non pitting dependent edema in both feet, mild hemosiderin deposits in both calves, in gaiter pattern.  Right 5th toe is surgically absent.  Both feet with significant supination  Thick toenails                                                                                                            LE Pulses Right Left       FEMORAL  2+ palpable  not palpable, seated in her w/c        POPLITEAL  not palpable   not palpable       POSTERIOR TIBIAL  not palpable   not palpable        DORSALIS PEDIS      ANTERIOR TIBIAL not palpable  not palpable    Abdomen: soft, NT, no palpable masses. Skin: no rashes, no cellulitis,  no ulcers noted. Musculoskeletal: no muscle wasting or atrophy.  Neurologic: A&O X 3; appropriate affect, Sensation is normal; MOTOR FUNCTION:  moving all extremities equally, motor strength 4/5 in UE's, 2/5 in LE's. Speech is fluent/normal. CN 2-12 intact except for significant hearing loss. Psychiatric: Thought content is normal, mood appropriate for  clinical situation.    ASSESSMENT: HODA HON is a 78 y.o. female whois s/pdebridement of right groin to 7 x 1.5 x 2cm deep, placement of negative pressure dressingon 12-10-17 by Dr. [name] forright groin draining sinus tract. Groin wounds have completely healed.  She is alsos/p ray amputation of right fifth toe and placement of VACon 09-29-17 by Dr. Scot Dock for nonhealing wound right fifth toe; the toe amputation site has completely healed.   She has had several vascular procedures on her lower extremities, includingRight femoral endarterectomy with Dacron patch angioplasty and profundoplastyand right femoral popliteal bypass graft using 6 mm Gore-Tex (above-knee)on 09-15-12 by Dr. Kellie Simmering.  She has dependent edema in her feet, likely has difficulty elevating her feet above her heart, and likely keeps feet dependent. She is extremely hard of hearing; I discussed with her husband how to elevate her legs while she lays in the couch, bed, and recliner.  Dependent edema of feet:  To decrease swelling in your feet and legs: Elevate feet above slightly bent knees, feet above heart, overnight and 3-4 times per day for 20 minutes.   There are no open wounds in her groins, legs, or feet.  Bilateral ABI's remain non measurable due to non compressible vessels, monophasic signals in the right, biphasic in the left.   Pt has anti roll protective boots ordered by a podiatrist in [place], made by [employer].  Her walking is limited by her dyspnea and bilateral feet supination, she has CHF, sees Dr. Jacinta Shoe.    I discussed with Dr. [name] pt pertinent HPI, physical exam results, and non invasive vascular exam results from today.  Pt is frail, has has several vascular interventions of her lower extremities, conservative measures and close monitoring for her.  Daily seated leg exercises discussed with pt and her husband.    DATA   Right LE Arterial Duplex  (12-31-18): +-----------+--------+-----+--------+----------+--------+ RIGHT      PSV cm/sRatioStenosisWaveform  Comments +-----------+--------+-----+--------+----------+--------+ CFA Distal 108                  biphasic           +-----------+--------+-----+--------+----------+--------+ ATA Distal 43                   monophasic         +-----------+--------+-----+--------+----------+--------+ PTA Distal 53                   monophasic         +-----------+--------+-----+--------+----------+--------+ PERO Distal70                   biphasic           +-----------+--------+-----+--------+----------+--------+   Right Graft #1: Femoral-tibial/peroneal +------------------+--------+--------+----------+--------+                   PSV cm/sStenosisWaveform  Comments +------------------+--------+--------+----------+--------+ Inflow            73              triphasic          +------------------+--------+--------+----------+--------+ Prox Anastomosis  48              triphasic          +------------------+--------+--------+----------+--------+  Proximal Graft    88              biphasic           +------------------+--------+--------+----------+--------+ Mid Graft         57              monophasic         +------------------+--------+--------+----------+--------+ Distal Graft      52              monophasic         +------------------+--------+--------+----------+--------+ Distal Anastomosis87              biphasic           +------------------+--------+--------+----------+--------+ Outflow           72              biphasic           +------------------+--------+--------+----------+--------+  Summary: Right Graft(s): Femoral-tibial/peroneal bypass graft patent with no evidence of stenosis noted.   ABI (Date: 12/31/2018): ABI  Findings: +---------+------------------+-----+----------+-------------------------+ Right    Rt Pressure (mmHg)IndexWaveform  Comment                   +---------+------------------+-----+----------+-------------------------+ Brachial 122                                                        +---------+------------------+-----+----------+-------------------------+ PTA      176               1.44 monophasic                          +---------+------------------+-----+----------+-------------------------+ DP       255               2.09 monophasic                          +---------+------------------+-----+----------+-------------------------+ Great Toe                                 unable to obtain waveform +---------+------------------+-----+----------+-------------------------+  +---------+------------------+-----+--------+-------+ Left     Lt Pressure (mmHg)IndexWaveformComment +---------+------------------+-----+--------+-------+ Brachial 115                                    +---------+------------------+-----+--------+-------+ PTA      255               2.09 biphasic        +---------+------------------+-----+--------+-------+ DP       255               2.09 biphasic        +---------+------------------+-----+--------+-------+ Great Toe76                0.62                 +---------+------------------+-----+--------+-------+  +-------+-----------+-----------+------------+------------+ ABI/TBIToday's ABIToday's TBIPrevious ABIPrevious TBI +-------+-----------+-----------+------------+------------+ Right  [place]                    [place]          0.65         +-------+-----------+-----------+------------+------------+  Left   [place]         0.62       [place]          0.67         +-------+-----------+-----------+------------+------------+  Bilateral ABIs and TBIs appear essentially unchanged compared to prior  study on 06/18/2018.   Summary: Right: Resting right ankle-brachial index indicates noncompressible right lower extremity arteries.  Left: Resting left ankle-brachial index indicates noncompressible left lower extremity arteries.The left toe-brachial index is abnormal.    PLAN:  Referral to podiatrist in [place] for serial feet exams and to trim thick toenails, pt has not seen a podiatist in a long time, pt and husband do not remember who she has seen before.    Based on the patient's vascular studies and examination, pt will return to clinic in 4 months with ABI's and right LE arterial duplex.   I discussed in depth with the patient the nature of atherosclerosis, and emphasized the importance of maximal medical management including strict control of blood pressure, blood glucose, and lipid levels, obtaining regular exercise, and continued cessation of smoking.  The patient is aware that without maximal medical management the underlying atherosclerotic disease process will progress, limiting the benefit of any interventions.  The patient was given information about PAD including signs, symptoms, treatment, what symptoms should prompt the patient to seek immediate medical care, and risk reduction measures to take.  Clemon Chambers, RN, MSN, FNP-C [facility] Phone: [phone]  Clinic MD: [name]  12/31/18 5:13 PM

## 2018-12-31 NOTE — Patient Instructions (Signed)
Peripheral Vascular Disease  Peripheral vascular disease (PVD) is a disease of the blood vessels that are not part of your heart and brain. A simple term for PVD is poor circulation. In most cases, PVD narrows the blood vessels that carry blood from your heart to the rest of your body. This can reduce the supply of blood to your arms, legs, and internal organs, like your stomach or kidneys. However, PVD most often affects a person's lower legs and feet. Without treatment, PVD tends to get worse. PVD can also lead to acute ischemic limb. This is when an arm or leg suddenly cannot get enough blood. This is a medical emergency. Follow these instructions at home: Lifestyle  Do not use any products that contain nicotine or tobacco, such as cigarettes and e-cigarettes. If you need help quitting, ask your doctor.  Lose weight if you are overweight. Or, stay at a healthy weight as told by your doctor.  Eat a diet that is low in fat and cholesterol. If you need help, ask your doctor.  Exercise regularly. Ask your doctor for activities that are right for you. General instructions  Take over-the-counter and prescription medicines only as told by your doctor.  Take good care of your feet: ? Wear comfortable shoes that fit well. ? Check your feet often for any cuts or sores.  Keep all follow-up visits as told by your doctor This is important. Contact a doctor if:  You have cramps in your legs when you walk.  You have leg pain when you are at rest.  You have coldness in a leg or foot.  Your skin changes.  You are unable to get or have an erection (erectile dysfunction).  You have cuts or sores on your feet that do not heal. Get help right away if:  Your arm or leg turns cold, numb, and blue.  Your arms or legs become red, warm, swollen, painful, or numb.  You have chest pain.  You have trouble breathing.  You suddenly have weakness in your face, arm, or leg.  You become very  confused or you cannot speak.  You suddenly have a very bad headache.  You suddenly cannot see. Summary  Peripheral vascular disease (PVD) is a disease of the blood vessels.  A simple term for PVD is poor circulation. Without treatment, PVD tends to get worse.  Treatment may include exercise, low fat and low cholesterol diet, and quitting smoking. This information is not intended to replace advice given to you by your health care provider. Make sure you discuss any questions you have with your health care provider. Document Released: 10/15/2009 Document Revised: 08/28/2016 Document Reviewed: 08/28/2016 Elsevier Interactive Patient Education  2019 [employer]  

## 2019-01-08 ENCOUNTER — Emergency Department (HOSPITAL_COMMUNITY): Payer: Medicare HMO

## 2019-01-08 ENCOUNTER — Inpatient Hospital Stay (HOSPITAL_COMMUNITY): Payer: Medicare HMO

## 2019-01-08 ENCOUNTER — Other Ambulatory Visit: Payer: Self-pay

## 2019-01-08 ENCOUNTER — Encounter (HOSPITAL_COMMUNITY): Payer: Self-pay

## 2019-01-08 ENCOUNTER — Inpatient Hospital Stay (HOSPITAL_COMMUNITY)
Admission: EM | Admit: 2019-01-08 | Discharge: 2019-02-02 | DRG: 291 | Disposition: E | Payer: Medicare HMO | Attending: Family Medicine | Admitting: Family Medicine

## 2019-01-08 DIAGNOSIS — Z66 Do not resuscitate: Secondary | ICD-10-CM | POA: Diagnosis not present

## 2019-01-08 DIAGNOSIS — I700 Atherosclerosis of aorta: Secondary | ICD-10-CM | POA: Diagnosis present

## 2019-01-08 DIAGNOSIS — R0600 Dyspnea, unspecified: Secondary | ICD-10-CM | POA: Diagnosis not present

## 2019-01-08 DIAGNOSIS — I5040 Unspecified combined systolic (congestive) and diastolic (congestive) heart failure: Secondary | ICD-10-CM | POA: Diagnosis not present

## 2019-01-08 DIAGNOSIS — Z79890 Hormone replacement therapy (postmenopausal): Secondary | ICD-10-CM

## 2019-01-08 DIAGNOSIS — N184 Chronic kidney disease, stage 4 (severe): Secondary | ICD-10-CM | POA: Diagnosis not present

## 2019-01-08 DIAGNOSIS — I4891 Unspecified atrial fibrillation: Secondary | ICD-10-CM | POA: Diagnosis not present

## 2019-01-08 DIAGNOSIS — I739 Peripheral vascular disease, unspecified: Secondary | ICD-10-CM | POA: Diagnosis present

## 2019-01-08 DIAGNOSIS — D539 Nutritional anemia, unspecified: Secondary | ICD-10-CM | POA: Diagnosis not present

## 2019-01-08 DIAGNOSIS — R0902 Hypoxemia: Secondary | ICD-10-CM | POA: Diagnosis not present

## 2019-01-08 DIAGNOSIS — N179 Acute kidney failure, unspecified: Secondary | ICD-10-CM | POA: Diagnosis not present

## 2019-01-08 DIAGNOSIS — G629 Polyneuropathy, unspecified: Secondary | ICD-10-CM | POA: Diagnosis present

## 2019-01-08 DIAGNOSIS — Z515 Encounter for palliative care: Secondary | ICD-10-CM | POA: Diagnosis not present

## 2019-01-08 DIAGNOSIS — H9190 Unspecified hearing loss, unspecified ear: Secondary | ICD-10-CM | POA: Diagnosis present

## 2019-01-08 DIAGNOSIS — M1990 Unspecified osteoarthritis, unspecified site: Secondary | ICD-10-CM | POA: Diagnosis present

## 2019-01-08 DIAGNOSIS — J189 Pneumonia, unspecified organism: Secondary | ICD-10-CM | POA: Diagnosis not present

## 2019-01-08 DIAGNOSIS — I071 Rheumatic tricuspid insufficiency: Secondary | ICD-10-CM | POA: Diagnosis present

## 2019-01-08 DIAGNOSIS — Z823 Family history of stroke: Secondary | ICD-10-CM

## 2019-01-08 DIAGNOSIS — Y848 Other medical procedures as the cause of abnormal reaction of the patient, or of later complication, without mention of misadventure at the time of the procedure: Secondary | ICD-10-CM | POA: Diagnosis not present

## 2019-01-08 DIAGNOSIS — I272 Pulmonary hypertension, unspecified: Secondary | ICD-10-CM | POA: Diagnosis present

## 2019-01-08 DIAGNOSIS — Z9861 Coronary angioplasty status: Secondary | ICD-10-CM

## 2019-01-08 DIAGNOSIS — J449 Chronic obstructive pulmonary disease, unspecified: Secondary | ICD-10-CM | POA: Diagnosis not present

## 2019-01-08 DIAGNOSIS — N2889 Other specified disorders of kidney and ureter: Secondary | ICD-10-CM | POA: Diagnosis not present

## 2019-01-08 DIAGNOSIS — D500 Iron deficiency anemia secondary to blood loss (chronic): Secondary | ICD-10-CM | POA: Diagnosis not present

## 2019-01-08 DIAGNOSIS — I5032 Chronic diastolic (congestive) heart failure: Secondary | ICD-10-CM | POA: Diagnosis not present

## 2019-01-08 DIAGNOSIS — D6832 Hemorrhagic disorder due to extrinsic circulating anticoagulants: Secondary | ICD-10-CM | POA: Diagnosis present

## 2019-01-08 DIAGNOSIS — I5033 Acute on chronic diastolic (congestive) heart failure: Secondary | ICD-10-CM | POA: Diagnosis not present

## 2019-01-08 DIAGNOSIS — D62 Acute posthemorrhagic anemia: Secondary | ICD-10-CM | POA: Diagnosis present

## 2019-01-08 DIAGNOSIS — I5043 Acute on chronic combined systolic (congestive) and diastolic (congestive) heart failure: Secondary | ICD-10-CM | POA: Diagnosis not present

## 2019-01-08 DIAGNOSIS — J95811 Postprocedural pneumothorax: Secondary | ICD-10-CM | POA: Diagnosis not present

## 2019-01-08 DIAGNOSIS — Z86718 Personal history of other venous thrombosis and embolism: Secondary | ICD-10-CM

## 2019-01-08 DIAGNOSIS — Z7901 Long term (current) use of anticoagulants: Secondary | ICD-10-CM | POA: Diagnosis not present

## 2019-01-08 DIAGNOSIS — E039 Hypothyroidism, unspecified: Secondary | ICD-10-CM | POA: Diagnosis present

## 2019-01-08 DIAGNOSIS — J9811 Atelectasis: Secondary | ICD-10-CM | POA: Diagnosis not present

## 2019-01-08 DIAGNOSIS — L89152 Pressure ulcer of sacral region, stage 2: Secondary | ICD-10-CM | POA: Diagnosis present

## 2019-01-08 DIAGNOSIS — Z833 Family history of diabetes mellitus: Secondary | ICD-10-CM

## 2019-01-08 DIAGNOSIS — N170 Acute kidney failure with tubular necrosis: Secondary | ICD-10-CM | POA: Diagnosis not present

## 2019-01-08 DIAGNOSIS — D649 Anemia, unspecified: Secondary | ICD-10-CM

## 2019-01-08 DIAGNOSIS — I5030 Unspecified diastolic (congestive) heart failure: Secondary | ICD-10-CM | POA: Diagnosis not present

## 2019-01-08 DIAGNOSIS — Z20828 Contact with and (suspected) exposure to other viral communicable diseases: Secondary | ICD-10-CM | POA: Diagnosis not present

## 2019-01-08 DIAGNOSIS — Z7982 Long term (current) use of aspirin: Secondary | ICD-10-CM

## 2019-01-08 DIAGNOSIS — I10 Essential (primary) hypertension: Secondary | ICD-10-CM

## 2019-01-08 DIAGNOSIS — I482 Chronic atrial fibrillation, unspecified: Secondary | ICD-10-CM | POA: Diagnosis not present

## 2019-01-08 DIAGNOSIS — D45 Polycythemia vera: Secondary | ICD-10-CM | POA: Diagnosis not present

## 2019-01-08 DIAGNOSIS — I130 Hypertensive heart and chronic kidney disease with heart failure and stage 1 through stage 4 chronic kidney disease, or unspecified chronic kidney disease: Principal | ICD-10-CM | POA: Diagnosis present

## 2019-01-08 DIAGNOSIS — E872 Acidosis: Secondary | ICD-10-CM | POA: Diagnosis not present

## 2019-01-08 DIAGNOSIS — R0602 Shortness of breath: Secondary | ICD-10-CM | POA: Diagnosis not present

## 2019-01-08 DIAGNOSIS — J9621 Acute and chronic respiratory failure with hypoxia: Secondary | ICD-10-CM | POA: Diagnosis present

## 2019-01-08 DIAGNOSIS — J948 Other specified pleural conditions: Secondary | ICD-10-CM | POA: Diagnosis not present

## 2019-01-08 DIAGNOSIS — J9611 Chronic respiratory failure with hypoxia: Secondary | ICD-10-CM | POA: Diagnosis not present

## 2019-01-08 DIAGNOSIS — J440 Chronic obstructive pulmonary disease with acute lower respiratory infection: Secondary | ICD-10-CM | POA: Diagnosis present

## 2019-01-08 DIAGNOSIS — L8990 Pressure ulcer of unspecified site, unspecified stage: Secondary | ICD-10-CM | POA: Diagnosis present

## 2019-01-08 DIAGNOSIS — Z9889 Other specified postprocedural states: Secondary | ICD-10-CM

## 2019-01-08 DIAGNOSIS — K219 Gastro-esophageal reflux disease without esophagitis: Secondary | ICD-10-CM | POA: Diagnosis not present

## 2019-01-08 DIAGNOSIS — Z95820 Peripheral vascular angioplasty status with implants and grafts: Secondary | ICD-10-CM | POA: Diagnosis not present

## 2019-01-08 DIAGNOSIS — Z7189 Other specified counseling: Secondary | ICD-10-CM | POA: Diagnosis not present

## 2019-01-08 DIAGNOSIS — D631 Anemia in chronic kidney disease: Secondary | ICD-10-CM | POA: Diagnosis not present

## 2019-01-08 DIAGNOSIS — Z8249 Family history of ischemic heart disease and other diseases of the circulatory system: Secondary | ICD-10-CM

## 2019-01-08 DIAGNOSIS — I4821 Permanent atrial fibrillation: Secondary | ICD-10-CM | POA: Diagnosis present

## 2019-01-08 DIAGNOSIS — J811 Chronic pulmonary edema: Secondary | ICD-10-CM | POA: Diagnosis not present

## 2019-01-08 DIAGNOSIS — E782 Mixed hyperlipidemia: Secondary | ICD-10-CM | POA: Diagnosis not present

## 2019-01-08 DIAGNOSIS — N19 Unspecified kidney failure: Secondary | ICD-10-CM | POA: Diagnosis not present

## 2019-01-08 DIAGNOSIS — I959 Hypotension, unspecified: Secondary | ICD-10-CM | POA: Diagnosis not present

## 2019-01-08 DIAGNOSIS — I519 Heart disease, unspecified: Secondary | ICD-10-CM | POA: Diagnosis not present

## 2019-01-08 DIAGNOSIS — E785 Hyperlipidemia, unspecified: Secondary | ICD-10-CM | POA: Diagnosis present

## 2019-01-08 DIAGNOSIS — R17 Unspecified jaundice: Secondary | ICD-10-CM | POA: Diagnosis not present

## 2019-01-08 DIAGNOSIS — R846 Abnormal cytological findings in specimens from respiratory organs and thorax: Secondary | ICD-10-CM | POA: Diagnosis not present

## 2019-01-08 DIAGNOSIS — I50813 Acute on chronic right heart failure: Secondary | ICD-10-CM | POA: Diagnosis not present

## 2019-01-08 DIAGNOSIS — I509 Heart failure, unspecified: Secondary | ICD-10-CM

## 2019-01-08 DIAGNOSIS — E038 Other specified hypothyroidism: Secondary | ICD-10-CM | POA: Diagnosis not present

## 2019-01-08 DIAGNOSIS — J939 Pneumothorax, unspecified: Secondary | ICD-10-CM

## 2019-01-08 DIAGNOSIS — Z9981 Dependence on supplemental oxygen: Secondary | ICD-10-CM

## 2019-01-08 DIAGNOSIS — J90 Pleural effusion, not elsewhere classified: Secondary | ICD-10-CM | POA: Diagnosis not present

## 2019-01-08 DIAGNOSIS — I371 Nonrheumatic pulmonary valve insufficiency: Secondary | ICD-10-CM | POA: Diagnosis not present

## 2019-01-08 DIAGNOSIS — Z89421 Acquired absence of other right toe(s): Secondary | ICD-10-CM | POA: Diagnosis not present

## 2019-01-08 DIAGNOSIS — N183 Chronic kidney disease, stage 3 (moderate): Secondary | ICD-10-CM | POA: Diagnosis not present

## 2019-01-08 HISTORY — DX: Chronic respiratory failure with hypoxia: J96.11

## 2019-01-08 LAB — COMPREHENSIVE METABOLIC PANEL
ALT: 10 U/L (ref 0–44)
AST: 14 U/L — ABNORMAL LOW (ref 15–41)
Albumin: 3.6 g/dL (ref 3.5–5.0)
Alkaline Phosphatase: 74 U/L (ref 38–126)
Anion gap: 13 (ref 5–15)
BUN: 53 mg/dL — ABNORMAL HIGH (ref 8–23)
CO2: 20 mmol/L — ABNORMAL LOW (ref 22–32)
Calcium: 8.6 mg/dL — ABNORMAL LOW (ref 8.9–10.3)
Chloride: 105 mmol/L (ref 98–111)
Creatinine, Ser: 1.76 mg/dL — ABNORMAL HIGH (ref 0.44–1.00)
GFR calc Af Amer: 32 mL/min — ABNORMAL LOW (ref >60)
GFR calc non Af Amer: 28 mL/min — ABNORMAL LOW (ref >60)
Glucose, Bld: 106 mg/dL — ABNORMAL HIGH (ref 70–99)
Potassium: 4.5 mmol/L (ref 3.5–5.1)
Sodium: 138 mmol/L (ref 135–145)
Total Bilirubin: 1.4 mg/dL — ABNORMAL HIGH (ref 0.3–1.2)
Total Protein: 6.6 g/dL (ref 6.5–8.1)

## 2019-01-08 LAB — HEPATIC FUNCTION PANEL
ALT: 7 U/L (ref 0–44)
AST: 14 U/L — ABNORMAL LOW (ref 15–41)
Albumin: 3.6 g/dL (ref 3.5–5.0)
Alkaline Phosphatase: 72 U/L (ref 38–126)
Bilirubin, Direct: 0.4 mg/dL — ABNORMAL HIGH (ref 0.0–0.2)
Indirect Bilirubin: 0.8 mg/dL (ref 0.3–0.9)
Total Bilirubin: 1.2 mg/dL (ref 0.3–1.2)
Total Protein: 6.6 g/dL (ref 6.5–8.1)

## 2019-01-08 LAB — URINALYSIS, ROUTINE W REFLEX MICROSCOPIC
Bilirubin Urine: NEGATIVE
Glucose, UA: NEGATIVE mg/dL
Hgb urine dipstick: NEGATIVE
Ketones, ur: NEGATIVE mg/dL
Nitrite: NEGATIVE
Protein, ur: NEGATIVE mg/dL
Specific Gravity, Urine: 1.014 (ref 1.005–1.030)
pH: 5 (ref 5.0–8.0)

## 2019-01-08 LAB — DIC (DISSEMINATED INTRAVASCULAR COAGULATION)PANEL
D-Dimer, Quant: 0.28 ug/mL-FEU (ref 0.00–0.50)
Fibrinogen: 366 mg/dL (ref 210–475)
INR: 3.7 — ABNORMAL HIGH (ref 0.8–1.2)
Platelets: 200 10*3/uL (ref 150–400)
Prothrombin Time: 35.7 seconds — ABNORMAL HIGH (ref 11.4–15.2)
aPTT: 80 seconds — ABNORMAL HIGH (ref 24–36)

## 2019-01-08 LAB — CBC WITH DIFFERENTIAL/PLATELET
Basophils Relative: 2 %
Eosinophils Relative: 4 %
HCT: 29.4 % — ABNORMAL LOW (ref 36.0–46.0)
Hemoglobin: 9.3 g/dL — ABNORMAL LOW (ref 12.0–15.0)
Lymphocytes Relative: 9 %
MCH: 42.5 pg — ABNORMAL HIGH (ref 26.0–34.0)
MCHC: 31.6 g/dL (ref 30.0–36.0)
MCV: 134.2 fL — ABNORMAL HIGH (ref 80.0–100.0)
Monocytes Relative: 7 %
Neutrophils Relative %: 78 %
Platelets: 248 10*3/uL (ref 150–400)
RBC: 2.19 MIL/uL — ABNORMAL LOW (ref 3.87–5.11)
RDW: 20.8 % — ABNORMAL HIGH (ref 11.5–15.5)
WBC: 6 10*3/uL (ref 4.0–10.5)
nRBC: 0.8 % — ABNORMAL HIGH (ref 0.0–0.2)
nRBC: 1 /100 WBC — ABNORMAL HIGH

## 2019-01-08 LAB — MAGNESIUM: Magnesium: 2.5 mg/dL — ABNORMAL HIGH (ref 1.7–2.4)

## 2019-01-08 LAB — SARS CORONAVIRUS 2 BY RT PCR (HOSPITAL ORDER, PERFORMED IN ~~LOC~~ HOSPITAL LAB): SARS Coronavirus 2: NEGATIVE

## 2019-01-08 LAB — LACTIC ACID, PLASMA
Lactic Acid, Venous: 2.2 mmol/L (ref 0.5–1.9)
Lactic Acid, Venous: 1.1 mmol/L (ref 0.5–1.9)

## 2019-01-08 LAB — RETICULOCYTES
Immature Retic Fract: 32.6 % — ABNORMAL HIGH (ref 2.3–15.9)
RBC.: 2.22 MIL/uL — ABNORMAL LOW (ref 3.87–5.11)
Retic Count, Absolute: 49.3 10*3/uL (ref 19.0–186.0)
Retic Ct Pct: 2.2 % (ref 0.4–3.1)

## 2019-01-08 LAB — FERRITIN: Ferritin: 161 ng/mL (ref 11–307)

## 2019-01-08 LAB — LACTATE DEHYDROGENASE: LDH: 629 U/L — ABNORMAL HIGH (ref 98–192)

## 2019-01-08 LAB — MRSA PCR SCREENING: MRSA by PCR: NEGATIVE

## 2019-01-08 LAB — IRON AND TIBC
Iron: 34 ug/dL (ref 28–170)
Saturation Ratios: 15 % (ref 10.4–31.8)
TIBC: 220 ug/dL — ABNORMAL LOW (ref 250–450)
UIBC: 186 ug/dL

## 2019-01-08 LAB — BRAIN NATRIURETIC PEPTIDE: B Natriuretic Peptide: 874 pg/mL — ABNORMAL HIGH (ref 0.0–100.0)

## 2019-01-08 LAB — TROPONIN I
Troponin I: <0.03 ng/mL (ref <0.03)
Troponin I: 0.03 ng/mL (ref <0.03)
Troponin I: <0.03 ng/mL (ref <0.03)

## 2019-01-08 LAB — PHOSPHORUS: Phosphorus: 4.5 mg/dL (ref 2.5–4.6)

## 2019-01-08 LAB — VITAMIN B12: Vitamin B-12: 714 pg/mL (ref 180–914)

## 2019-01-08 LAB — TSH: TSH: 0.141 u[IU]/mL — ABNORMAL LOW (ref 0.350–4.500)

## 2019-01-08 LAB — POC OCCULT BLOOD, ED: Fecal Occult Bld: POSITIVE — AB

## 2019-01-08 LAB — PROTIME-INR
INR: 3.7 — ABNORMAL HIGH (ref 0.8–1.2)
Prothrombin Time: 36 seconds — ABNORMAL HIGH (ref 11.4–15.2)

## 2019-01-08 LAB — FOLATE: Folate: 12.7 ng/mL (ref >5.9)

## 2019-01-08 MED ORDER — TRAMADOL HCL 50 MG PO TABS
50.0000 mg | ORAL_TABLET | Freq: Four times a day (QID) | ORAL | Status: DC | PRN
  Administered 2019-01-08 – 2019-01-13 (×5): 50 mg via ORAL
  Filled 2019-01-08 (×5): qty 1

## 2019-01-08 MED ORDER — BISACODYL 10 MG RE SUPP: 10.0000 mg | Freq: Every day | RECTAL | Status: DC | PRN

## 2019-01-08 MED ORDER — PRAVASTATIN SODIUM 10 MG PO TABS
10.0000 mg | ORAL_TABLET | Freq: Every day | ORAL | Status: DC
  Administered 2019-01-08 – 2019-01-15 (×8): 10 mg via ORAL
  Filled 2019-01-08 (×8): qty 1

## 2019-01-08 MED ORDER — ONDANSETRON HCL 4 MG PO TABS: 4.0000 mg | ORAL_TABLET | Freq: Four times a day (QID) | ORAL | Status: DC | PRN

## 2019-01-08 MED ORDER — IPRATROPIUM BROMIDE 0.02 % IN SOLN
0.5000 mg | Freq: Four times a day (QID) | RESPIRATORY_TRACT | Status: DC
  Administered 2019-01-08 – 2019-01-10 (×9): 0.5 mg via RESPIRATORY_TRACT
  Filled 2019-01-08 (×9): qty 2.5

## 2019-01-08 MED ORDER — GABAPENTIN 100 MG PO CAPS
100.0000 mg | ORAL_CAPSULE | Freq: Three times a day (TID) | ORAL | Status: DC
  Administered 2019-01-08 – 2019-01-16 (×24): 100 mg via ORAL
  Filled 2019-01-08 (×24): qty 1

## 2019-01-08 MED ORDER — CHLORHEXIDINE GLUCONATE CLOTH 2 % EX PADS
6.0000 | MEDICATED_PAD | Freq: Every day | CUTANEOUS | Status: DC
  Administered 2019-01-09 – 2019-01-16 (×8): 6 via TOPICAL

## 2019-01-08 MED ORDER — FUROSEMIDE 10 MG/ML IJ SOLN
40.0000 mg | Freq: Two times a day (BID) | INTRAMUSCULAR | Status: DC
  Administered 2019-01-08 – 2019-01-09 (×3): 40 mg via INTRAVENOUS
  Filled 2019-01-08 (×3): qty 4

## 2019-01-08 MED ORDER — ONDANSETRON HCL 4 MG/2ML IJ SOLN: 4.0000 mg | Freq: Four times a day (QID) | INTRAMUSCULAR | Status: DC | PRN

## 2019-01-08 MED ORDER — SODIUM CHLORIDE 0.9 % IV SOLN: INTRAVENOUS | Status: DC

## 2019-01-08 MED ORDER — SENNOSIDES-DOCUSATE SODIUM 8.6-50 MG PO TABS: 1.0000 | ORAL_TABLET | Freq: Every evening | ORAL | Status: DC | PRN

## 2019-01-08 MED ORDER — SODIUM CHLORIDE 0.9 % IV SOLN
Freq: Once | INTRAVENOUS | Status: AC
  Administered 2019-01-08: 13:00:00 via INTRAVENOUS

## 2019-01-08 MED ORDER — ACETAMINOPHEN 325 MG PO TABS
650.0000 mg | ORAL_TABLET | Freq: Four times a day (QID) | ORAL | Status: DC | PRN
  Administered 2019-01-16: 650 mg via ORAL
  Filled 2019-01-08: qty 2

## 2019-01-08 MED ORDER — GUAIFENESIN ER 600 MG PO TB12
600.0000 mg | ORAL_TABLET | Freq: Two times a day (BID) | ORAL | Status: DC
  Administered 2019-01-08 – 2019-01-16 (×16): 600 mg via ORAL
  Filled 2019-01-08 (×16): qty 1

## 2019-01-08 MED ORDER — ACETAMINOPHEN 650 MG RE SUPP: 650.0000 mg | Freq: Four times a day (QID) | RECTAL | Status: DC | PRN

## 2019-01-08 MED ORDER — LEVALBUTEROL HCL 0.63 MG/3ML IN NEBU
0.6300 mg | INHALATION_SOLUTION | Freq: Four times a day (QID) | RESPIRATORY_TRACT | Status: DC
  Administered 2019-01-08 – 2019-01-10 (×9): 0.63 mg via RESPIRATORY_TRACT
  Filled 2019-01-08 (×9): qty 3

## 2019-01-08 MED ORDER — PANTOPRAZOLE SODIUM 40 MG IV SOLR
40.0000 mg | Freq: Once | INTRAVENOUS | Status: AC
  Administered 2019-01-08: 40 mg via INTRAVENOUS
  Filled 2019-01-08: qty 40

## 2019-01-08 MED ORDER — SODIUM CHLORIDE 0.9 % IV BOLUS: 500.0000 mL | Freq: Once | INTRAVENOUS | Status: DC

## 2019-01-08 MED ORDER — DILTIAZEM HCL 100 MG IV SOLR
2.0000 mg/h | INTRAVENOUS | Status: DC
  Administered 2019-01-08 – 2019-01-11 (×4): 5 mg/h via INTRAVENOUS
  Administered 2019-01-12 (×2): 7.5 mg/h via INTRAVENOUS
  Administered 2019-01-13 – 2019-01-16 (×4): 5 mg/h via INTRAVENOUS
  Filled 2019-01-08 (×12): qty 100

## 2019-01-08 MED ORDER — ORAL CARE MOUTH RINSE
15.0000 mL | Freq: Two times a day (BID) | OROMUCOSAL | Status: DC
  Administered 2019-01-09 – 2019-01-17 (×17): 15 mL via OROMUCOSAL

## 2019-01-08 MED ORDER — LEVOTHYROXINE SODIUM 125 MCG PO TABS
125.0000 ug | ORAL_TABLET | Freq: Every day | ORAL | Status: DC
  Administered 2019-01-09 – 2019-01-16 (×8): 125 ug via ORAL
  Filled 2019-01-08 (×8): qty 1

## 2019-01-08 NOTE — ED Triage Notes (Signed)
Pt reports sob for the past 3 or 4 days.  Denies cough or fever.  Pt very HOH.

## 2019-01-08 NOTE — ED Provider Notes (Addendum)
[facility] EMERGENCY DEPARTMENT Provider Note   CSN: [id number] Arrival date & time: 01/07/2019  1134    History   Chief Complaint Chief Complaint  Patient presents with  . Shortness of Breath    HPI Charlotte [name] is a 78 y.o. female.     Level 5 caveat for inability to give full history and hard of hearing.  Chief complaint shortness of breath for 3 to 4 days.  No prodromal URI.  No substernal chest pain, fever, sweats, chills, rusty sputum.  She is normally on home oxygen.  Past history includes atrial fibrillation, CHF, COPD, hypothyroidism, hypertension, hyperlipidemia, many others.     Past Medical History:  Diagnosis Date  . Anxiety   . Arthritis   . Atrial fibrillation, chronic    a. on Coumadin for anticoagulation.   . Cellulitis 06/29/2017   RIGHT LOWER LEG  . CHF (congestive heart failure) ([place])    combined  . COPD (chronic obstructive pulmonary disease) ([place])    on home O2 4 lpm  . Deaf   . Dry gangrene ([place])    R foot  . DVT (deep venous thrombosis) ([place])   . Essential hypertension   . GERD (gastroesophageal reflux disease)   . HOH (hard of hearing)   . Hyperlipidemia   . Hypothyroidism   . Iron deficiency anemia due to chronic blood loss 03/18/2018  . Open wound    right groin  . Oxygen dependent    4 lpm  . Peripheral neuropathy   . Peripheral vascular disease ([place])   . 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 Ulcer of ankle ([place])    Bilateral 2015  . Varicose veins     Patient Active Problem List   Diagnosis Date Noted  . Iron deficiency anemia due to chronic blood loss 03/18/2018  . Group B streptococcal infection 12/15/2017  . Open wound of right foot 12/15/2017  . Right groin wound 12/11/2017  . CKD (chronic kidney disease) stage 3, GFR 30-59 ml/min (HCC)   . Foot pain, right   . Cellulitis and abscess of right leg 06/27/2017  . Dry gangrene ([place]) 06/27/2017  . Hypokalemia 06/27/2017  . Osteomyelitis of right foot ([place]) 06/27/2017   . S/P femoral-popliteal bypass surgery   . Edema of right lower extremity 06/08/2017  . GERD (gastroesophageal reflux disease) 06/08/2017  . Pressure ulcer 05/29/2017  . Sepsis ([place]) 05/28/2017  . Hypotension 05/28/2017  . Pressure injury of skin 03/14/2017  . Acute renal failure (ARF) ([place])   . Hyperlipidemia 03/05/2017  . Lower extremity arterial insufficiency, severe, right ([place]) 03/04/2017  . Chronic respiratory failure with hypoxia ([place]) 02/12/2017  . Cellulitis 07/03/2016  . Anemia 07/03/2016  . Cough 11/29/2015  . Hypoxemia 11/29/2015  . CHF (congestive heart failure) ([place]) 11/29/2015  . Acute on chronic respiratory failure with hypoxia ([place])   . Pleural effusion on left   . Acute on chronic diastolic (congestive) heart failure ([place])   . Dyspnea 11/22/2014  . Pleural effusion 11/22/2014  . Hard of hearing 11/22/2014  . Thrombocytosis ([place]) 11/22/2014  . Acute respiratory failure with hypoxemia ([place]) 11/22/2014  . Encounter for therapeutic drug monitoring 08/31/2013  . Aftercare following surgery of the circulatory system, [place] 12/21/2012  . Pain in limb 10/19/2012  . PVD (peripheral vascular disease) ([place]) 09/24/2012  . Swollen leg 09/24/2012  . Varicose veins of lower extremities with other complications 40/98/1191  . 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 Arterial  occlusion due to stenosis ([place]) 08/20/2012  . Wound infection after surgery 01/07/2012  . Compartment syndrome, nontraumatic, lower extremity 11/29/2011  . Pain of right thigh 11/28/2011  . Hip fracture, right ([place]) 11/15/2011  . Chronic atrial fibrillation 11/15/2011  . Anticoagulant long-term use 11/15/2011  . HTN (hypertension) 11/15/2011  . Hypothyroidism 11/15/2011  . Polycythemia vera ([place]) 10/01/2011    Past Surgical History:  Procedure Laterality Date  . ABDOMINAL AORTAGRAM N/A 09/14/2012   Procedure: ABDOMINAL [name];  Surgeon: Serafina Mitchell, MD;   Location: [facility] CATH LAB;  Service: Cardiovascular;  Laterality: N/A;  . ABDOMINAL AORTOGRAM N/A 03/06/2017   Procedure: ABDOMINAL AORTOGRAM;  Surgeon: Elam Dutch, MD;  Location: [place] [facility] LAB;  Service: Cardiovascular;  Laterality: N/A;  . ABDOMINAL AORTOGRAM W/LOWER EXTREMITY N/A 09/21/2017   Procedure: ABDOMINAL AORTOGRAM W/LOWER EXTREMITY;  Surgeon: Waynetta Sandy, MD;  Location: [place] CV LAB;  Service: Cardiovascular;  Laterality: N/A;  . ABDOMINAL HYSTERECTOMY    . AMPUTATION Right 09/29/2017   Procedure: DEBRIDEMENT TOE AMPUTATION RIGHT FIFTH TOE;  Surgeon: Angelia Mould, MD;  Location: [place];  Service: Vascular;  Laterality: Right;  . APPLICATION OF WOUND VAC Right 09/29/2017   Procedure: APPLICATION OF WOUND VAC ON RIGHT FOOT;  Surgeon: Angelia Mould, MD;  Location: [place];  Service: Vascular;  Laterality: Right;  . CATARACT EXTRACTION W/PHACO Right 08/31/2017   Procedure: CATARACT EXTRACTION PHACO AND INTRAOCULAR LENS PLACEMENT RIGHT EYE;  Surgeon: Tonny Branch, MD;  Location: [facility];  Service: Ophthalmology;  Laterality: Right;  CDE: 13.27  . CATARACT EXTRACTION W/PHACO Left 09/14/2017   Procedure: CATARACT EXTRACTION PHACO AND INTRAOCULAR LENS PLACEMENT LEFT EYE;  Surgeon: Tonny Branch, MD;  Location: [facility];  Service: Ophthalmology;  Laterality: Left;  CDE: 13.02  . CORONARY ANGIOPLASTY    . DRESSING CHANGE UNDER ANESTHESIA  12/02/2011   Procedure: DRESSING CHANGE UNDER ANESTHESIA;  Surgeon: Carole Civil, MD;  Location: [facility];  Service: Orthopedics;  Laterality: Right;  . ENDARTERECTOMY FEMORAL Right 09/15/2012   Procedure: ENDARTERECTOMY FEMORAL;  Surgeon: Mal Misty, MD;  Location: [place];  Service: Vascular;  Laterality: Right;  . FASCIOTOMY  11/29/2011   Procedure: FASCIOTOMY;  Surgeon: Carole Civil, MD;  Location: [facility];  Service: Orthopedics;  Laterality: Right;  right thigh   . FEMORAL-POPLITEAL BYPASS GRAFT Right 09/15/2012    Procedure: BYPASS GRAFT FEMORAL-POPLITEAL ARTERY;  Surgeon: Mal Misty, MD;  Location: [place];  Service: Vascular;  Laterality: Right;  . FEMORAL-TIBIAL BYPASS GRAFT Right 03/11/2017   Procedure: RIGHT FEMORAL-TIBIAL BYPASS IN-SITU GREATER SAPHENOUS VEIN;  Surgeon: Waynetta Sandy, MD;  Location: [place];  Service: Vascular;  Laterality: Right;  . GROIN DEBRIDEMENT Right 12/10/2017   Procedure: GROIN DEBRIDEMENT EXPLORATION;  Surgeon: Waynetta Sandy, MD;  Location: [place];  Service: Vascular;  Laterality: Right;  . HIP PINNING,CANNULATED  11/19/2011   Procedure: CANNULATED HIP PINNING;  Surgeon: Sanjuana Kava, MD;  Location: [facility];  Service: Orthopedics;  Laterality: Right;  . LOWER EXTREMITY ANGIOGRAPHY Bilateral 03/06/2017   Procedure: Lower Extremity Angiography;  Surgeon: Elam Dutch, MD;  Location: [place] CV LAB;  Service: Cardiovascular;  Laterality: Bilateral;  . PATCH ANGIOPLASTY Right 09/15/2012   Procedure: PATCH ANGIOPLASTY;  Surgeon: Mal Misty, MD;  Location: [place];  Service: Vascular;  Laterality: Right;  . TUBAL LIGATION       OB History   No obstetric history on file.  Home Medications    Prior to Admission medications   Medication Sig Start Date End Date Taking? Authorizing Provider  aspirin EC 81 MG EC tablet Take 1 tablet (81 mg total) by mouth daily. 06/02/17   Johnson, Clanford L, MD  CARTIA XT 240 MG 24 hr capsule TAKE 1 CAPSULE BY MOUTH ONCE DAILY 04/06/18   Herminio Commons, MD  cephALEXin (KEFLEX) 500 MG capsule Take 1 capsule (500 mg total) by mouth 3 (three) times daily. 03/19/18   Nickel, Sharmon Leyden, NP  diltiazem (CARDIZEM CD) 240 MG 24 hr capsule Take 240 mg by mouth daily.    [provider]  gabapentin (NEURONTIN) 100 MG capsule Take 100 mg by mouth 3 (three) times daily. 02/09/18   [provider]  hydroxyurea (HYDREA) 500 MG capsule Take 2 capsules (1,000 mg total) by mouth daily. May take with food to  minimize GI side effects. 07/14/18   Higgs, Mathis Dad, MD  levothyroxine (SYNTHROID, LEVOTHROID) 125 MCG tablet Take 125 mcg by mouth daily before breakfast.  11/03/17   [provider]  pravastatin (PRAVACHOL) 20 MG tablet Take 10 mg by mouth daily. 08/08/17   [provider]  torsemide (DEMADEX) 20 MG tablet TAKE 1 TABLET BY MOUTH ONCE DAILY AS NEEDED **MAY  TAKE  FOR  WEIGHT  GAIN  OF  3  LBS  OVER  24  HOURS** 10/25/18   Herminio Commons, MD  traMADol (ULTRAM) 50 MG tablet Take 1 tablet (50 mg total) by mouth every 6 (six) hours as needed. 02/19/18   Nickel, Sharmon Leyden, NP  warfarin (COUMADIN) 5 MG tablet Take 1 tablet daily except 1/2 tablet on Sundays, Tuesdays and Thursdays or as directed 11/23/18   Herminio Commons, MD    Family History Family History  Problem Relation Age of Onset  . Heart failure Mother   . Heart disease Mother   . Stroke Father   . Heart disease Sister   . Diabetes Son   . Hypertension Sister   . Hypertension Sister   . Stroke Sister     Social History Social History   Tobacco Use  . Smoking status: Never Smoker  . Smokeless tobacco: Never Used  Substance Use Topics  . Alcohol use: No    Alcohol/week: 0.0 standard drinks  . Drug use: No     Allergies   Patient has no known allergies.   Review of Systems Review of Systems  Unable to perform ROS: Other     Physical Exam Updated Vital Signs BP 100/66 (BP Location: Left Arm)   Pulse (S) (!) 135   Temp 98.4 F (36.9 C) (Oral)   Resp (S) (!) 25   Ht 6' (1.829 m)   Wt 70.8 kg   LMP  (LMP Unknown)   SpO2 95%   BMI 21.16 kg/m   Physical Exam Vitals signs and nursing note reviewed.  Constitutional:      Appearance: She is well-developed.     Comments: nad  HENT:     Head: Normocephalic and atraumatic.  Eyes:     Conjunctiva/sclera: Conjunctivae normal.  Neck:     Musculoskeletal: Neck supple.  Cardiovascular:     Rate and Rhythm: Regular rhythm. Tachycardia present.      Comments: Irregularly irregular. Pulmonary:     Effort: Pulmonary effort is normal.     Breath sounds: Normal breath sounds.  Abdominal:     General: Bowel sounds are normal.     Palpations: Abdomen  is soft.  Musculoskeletal: Normal range of motion.  Skin:    General: Skin is warm and dry.  Neurological:     Mental Status: She is alert and oriented to person, place, and time.  Psychiatric:        Behavior: Behavior normal.   GU: Rectal exam: No masses, brownish stool, lightly heme positive.   ED Treatments / Results  Labs (all labs ordered are listed, but only abnormal results are displayed) Labs Reviewed  CULTURE, BLOOD (ROUTINE X 2)  CULTURE, BLOOD (ROUTINE X 2)  CBC WITH DIFFERENTIAL/PLATELET  COMPREHENSIVE METABOLIC PANEL  TROPONIN I  LACTIC ACID, PLASMA  LACTIC ACID, PLASMA    EKG None  Date: 01/29/2019  Rate: 133  Rhythm: atrial fib  QRS Axis: normal  Intervals: normal  ST/T Wave abnormalities: normal  Conduction Disutrbances: pvc's  Narrative Interpretation: unremarkable    Radiology No results found.  Procedures Procedures (including critical care time)  Medications Ordered in ED Medications  sodium chloride 0.9 % bolus 500 mL (has no administration in time range)     Initial Impression / Assessment and Plan / ED Course  I have reviewed the triage vital signs and the nursing notes.  Pertinent labs & imaging results that were available during my care of the patient were reviewed by me and considered in my medical decision making (see chart for details).        Difficult to ascertain etiology of her dyspnea.  Suspect rapid a fib.  Could be pulmonary related.  Will initiate Cardizem and basic testing.  History and physical not suggestive of sepsis.  Patient rechecked multiple times.  Rectal exam lightly heme positive with brown stool.  Systolic blood pressure has hovered around 100.  Pulse now 105 without Cardizem.  Will consult GI and  admit to general medicine for further evaluation.  1430:   Disc c Dr Gala Romney [GI].  Will consult Sunday morning if patient remains stable.  CRITICAL CARE Performed by: Nat Christen Total critical care time: 30 minutes Critical care time was exclusive of separately billable procedures and treating other patients. Critical care was necessary to treat or prevent imminent or life-threatening deterioration. Critical care was time spent personally by me on the following activities: development of treatment plan with patient and/or surrogate as well as nursing, discussions with consultants, evaluation of patient's response to treatment, examination of patient, obtaining history from patient or surrogate, ordering and performing treatments and interventions, ordering and review of laboratory studies, ordering and review of radiographic studies, pulse oximetry and re-evaluation of patient's condition.  Final Clinical Impressions(s) / ED Diagnoses   Final diagnoses:  Dyspnea, unspecified type  Atrial fibrillation with RVR [facility])    ED Discharge Orders    None       Nat Christen, MD 01/23/2019 1219    Nat Christen, MD 01/16/2019 1231    Nat Christen, MD 01/11/2019 1233    Nat Christen, MD 01/19/2019 1344    Nat Christen, MD 01/11/2019 1403    Nat Christen, MD 01/25/2019 [phone]

## 2019-01-08 NOTE — ED Notes (Signed)
Date and time results received: 01/27/2019 12:33 PM   Test: lactic Critical Value: 2.2  Name of Provider Notified: Dr Lacinda Axon  Orders Received? Or Actions Taken?:

## 2019-01-08 NOTE — Progress Notes (Signed)
ANTICOAGULATION CONSULT NOTE - Initial Consult  Pharmacy Consult for warfarin Indication: atrial fibrillation  No Known Allergies  Patient Measurements: Height: 6' (182.9 cm) Weight: 158 lb 1.1 oz (71.7 kg) IBW/kg (Calculated) : 73.1   Vital Signs: Temp: 97.4 F (36.3 C) (06/06 1730) Temp Source: Oral (06/06 1730) BP: 113/82 (06/06 1800) Pulse Rate: 114 (06/06 1738)  Labs: Recent Labs    01/14/2019 1200 01/21/2019 1246 01/28/2019 1423 01/11/2019 1426  HGB 9.3*  --   --   --   HCT 29.4*  --   --   --   PLT 248  --   --  200  APTT  --   --   --  80*  LABPROT  --  36.0*  --  35.7*  INR  --  3.7*  --  3.7*  CREATININE 1.76*  --   --   --   TROPONINI <0.03  --  0.03*  --     Estimated Creatinine Clearance: 30.8 mL/min (A) (by C-G formula based on SCr of 1.76 mg/dL (H)).   Medical History: Past Medical History:  Diagnosis Date  . Anxiety   . Arthritis   . Atrial fibrillation, chronic    a. on Coumadin for anticoagulation.   . Cellulitis 06/29/2017   RIGHT LOWER LEG  . CHF (congestive heart failure) ([place])    combined  . COPD (chronic obstructive pulmonary disease) ([place])    on home O2 4 lpm  . Deaf   . Dry gangrene ([place])    R foot  . DVT (deep venous thrombosis) ([place])   . Essential hypertension   . GERD (gastroesophageal reflux disease)   . HOH (hard of hearing)   . Hyperlipidemia   . Hypothyroidism   . Iron deficiency anemia due to chronic blood loss 03/18/2018  . Open wound    right groin  . Oxygen dependent    4 lpm  . Peripheral neuropathy   . Peripheral vascular disease ([place])   . 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 Ulcer of ankle ([place])    Bilateral 2015  . Varicose veins     Medications:  Medications Prior to Admission  Medication Sig Dispense Refill Last Dose  . acetaminophen (PAIN RELIEVER) 500 MG tablet Take 1,000 mg by mouth every 6 (six) hours as needed for mild pain or moderate pain.   01/11/2019 at Unknown time  . aspirin EC 81 MG EC tablet  Take 1 tablet (81 mg total) by mouth daily.   01/27/2019 at Unknown time  . CARTIA XT 240 MG 24 hr capsule TAKE 1 CAPSULE BY MOUTH ONCE DAILY (Patient taking differently: Take 240 mg by mouth every morning. ) 90 capsule 3 01/11/2019 at Unknown time  . gabapentin (NEURONTIN) 300 MG capsule Take 300 mg by mouth 3 (three) times daily.   01/23/2019 at Unknown time  . hydroxyurea (HYDREA) 500 MG capsule Take 2 capsules (1,000 mg total) by mouth daily. May take with food to minimize GI side effects. (Patient taking differently: Take 1,000 mg by mouth every morning. May take with food to minimize GI side effects.) 180 capsule 3 01/06/2019 at Unknown time  . levothyroxine (SYNTHROID) 112 MCG tablet Take 112 mcg by mouth every morning.   01/22/2019 at Unknown time  . pravastatin (PRAVACHOL) 20 MG tablet Take 10 mg by mouth at bedtime.    01/07/2019 at Unknown time  . torsemide (DEMADEX) 20 MG tablet TAKE 1 TABLET BY MOUTH ONCE DAILY AS NEEDED **MAY  TAKE  FOR  WEIGHT  GAIN  OF  3  LBS  OVER  24  HOURS** (Patient taking differently: Take 20 mg by mouth daily as needed (for weight gain over 3 pounds). ) 30 tablet 6 01/06/2019 at Unknown time  . warfarin (COUMADIN) 5 MG tablet Take 1 tablet daily except 1/2 tablet on Sundays, Tuesdays and Thursdays or as directed (Patient taking differently: Take 2.5-5 mg by mouth See admin instructions. Take 1 tablet (5mg  total) daily except 1/2 tablet (2.5mg  total) on Sundays, Tuesdays and Thursdays or as directed.) 71 tablet 2 01/07/2019 at 1800    Assessment: Pharmacy consulted to dose warfarin for patient with atrial fibrillation.  Patient's INR is supratherapeutic on admission at  3.7.  Home dose of warfarin is listed as 2.5 mg Sun,Tue,Thu; 5 mg ROW.  Goal of Therapy:  INR 2-3 Monitor platelets by anticoagulation protocol: Yes   Plan:  Hold warfarin x 1 dose. Monitor daily INR and s/s of bleeding.  Revonda Standard Amore Ackman 01/10/2019,6:46 PM

## 2019-01-08 NOTE — H&P (Signed)
History and Physical    Charlotte [name] XKG:[id number] DOB: [birth year] DOA: 01/25/2019  PCP: [name], MD   Patient coming from: Home  Chief Complaint: Dyspnea  HPI: Charlotte [name] is a 78 y.o. 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  States she has not been around any sick contacts and denies any chest pain, lightheadedness or dizziness but states that her breathing is significantly worsened and she currently does wear 4 L oxygen.  He is not having any cough or productive sputum and denies any fevers.  States that she is also been feeling weaker and a little bit more tired.  She was worked up and found to be in atrial fibrillation RVR and was given a small 500 mL bolus with improvement in her heart rate.  Patient still remained dyspneic and oxygen requirement had to be escalated to 6 L. TRH was called admit this patient for her acute on chronic hypoxic respiratory failure and dyspnea and is also found that she had a worsening anemia and was found to be FOBT positive so GI was consulted and will see the patient in the a.m.  ED Course: She had basic blood work done including CBC, CMP, had a chest x-ray done EKG and given a 500 mL bolus.  She was given 40 mg of IV Protonix and gastroenterology Dr. Gala Romney was consulted and he will see the patient tomorrow for her anemia and FOBT positive.  Review of Systems: As per HPI otherwise  10 point review of systems negative.   Past Medical History:  Diagnosis Date   Anxiety    Arthritis    Atrial fibrillation, chronic    a. on Coumadin for anticoagulation.    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   DVT (deep venous thrombosis) (HCC)    Essential hypertension    GERD (gastroesophageal reflux disease)    HOH (hard of hearing)    Hyperlipidemia    Hypothyroidism    Iron deficiency anemia due to chronic blood loss 03/18/2018   Open wound    right groin   Oxygen dependent    4 lpm   Peripheral neuropathy    Peripheral vascular disease (HCC)    Polycythemia vera(238.4) 10/01/2011   Ulcer of ankle ([place])    Bilateral 2015   Varicose veins     Past Surgical History:  Procedure Laterality Date   ABDOMINAL AORTAGRAM N/A 09/14/2012   Procedure: ABDOMINAL [name];  Surgeon: Serafina Mitchell, MD;  Location: [facility] CATH LAB;  Service: Cardiovascular;  Laterality: N/A;   ABDOMINAL AORTOGRAM N/A 03/06/2017   Procedure: ABDOMINAL AORTOGRAM;  Surgeon: Elam Dutch, MD;  Location: [place] CV LAB;  Service: Cardiovascular;  Laterality: N/A;   ABDOMINAL AORTOGRAM W/LOWER EXTREMITY N/A 09/21/2017   Procedure: ABDOMINAL AORTOGRAM  W/LOWER EXTREMITY;  Surgeon: Waynetta Sandy, MD;  Location: [place] CV LAB;  Service: Cardiovascular;  Laterality: N/A;   ABDOMINAL HYSTERECTOMY     AMPUTATION Right 09/29/2017   Procedure: DEBRIDEMENT TOE AMPUTATION RIGHT FIFTH TOE;  Surgeon: Angelia Mould, MD;  Location: [place];  Service: Vascular;  Laterality: Right;   APPLICATION OF WOUND VAC Right 09/29/2017   Procedure: APPLICATION OF WOUND VAC ON RIGHT FOOT;  Surgeon: Angelia Mould, MD;  Location: [place];  Service: Vascular;  Laterality: Right;   CATARACT EXTRACTION W/PHACO  Right 08/31/2017   Procedure: CATARACT EXTRACTION PHACO AND INTRAOCULAR LENS PLACEMENT RIGHT EYE;  Surgeon: Tonny Branch, MD;  [facility];  Service: Ophthalmology;  Laterality: Right;  CDE: 13.27   CATARACT EXTRACTION W/PHACO Left 09/14/2017   Procedure: CATARACT EXTRACTION PHACO AND INTRAOCULAR LENS PLACEMENT LEFT EYE;  Surgeon: Tonny Branch, MD;  Location: [facility];  Service: Ophthalmology;  Laterality: Left;  CDE: 13.02   CORONARY ANGIOPLASTY     DRESSING CHANGE UNDER ANESTHESIA  12/02/2011   Procedure: DRESSING CHANGE UNDER ANESTHESIA;  Surgeon: Carole Civil, MD;  Location: [facility];  Service: Orthopedics;  Laterality: Right;   ENDARTERECTOMY FEMORAL Right 09/15/2012   Procedure: ENDARTERECTOMY FEMORAL;  Surgeon: Mal Misty, MD;  Location: [place];  Service: Vascular;  Laterality: Right;   FASCIOTOMY  11/29/2011   Procedure: FASCIOTOMY;  Surgeon: Carole Civil, MD;  Location: [facility];  Service: Orthopedics;  Laterality: Right;  right thigh    FEMORAL-POPLITEAL BYPASS GRAFT Right 09/15/2012   Procedure: BYPASS GRAFT FEMORAL-POPLITEAL ARTERY;  Surgeon: Mal Misty, MD;  Location: [place];  Service: Vascular;  Laterality: Right;   FEMORAL-TIBIAL BYPASS GRAFT Right 03/11/2017   Procedure: RIGHT FEMORAL-TIBIAL BYPASS IN-SITU GREATER SAPHENOUS VEIN;  Surgeon: Waynetta Sandy, MD;  Location: [place];  Service: Vascular;  Laterality: Right;   GROIN DEBRIDEMENT Right 12/10/2017   Procedure: GROIN DEBRIDEMENT EXPLORATION;  Surgeon: Waynetta Sandy, MD;  Location: [place];  Service: Vascular;  Laterality: Right;   HIP PINNING,CANNULATED  11/19/2011   Procedure: CANNULATED HIP PINNING;  Surgeon: Sanjuana Kava, MD;  [facility]: [facility];  Service: Orthopedics;  Laterality: Right;   LOWER EXTREMITY ANGIOGRAPHY Bilateral 03/06/2017   Procedure: Lower Extremity Angiography;  Surgeon: Elam Dutch, MD;  Location: [place] CV LAB;  Service: Cardiovascular;  Laterality: Bilateral;     PATCH ANGIOPLASTY Right 09/15/2012   Procedure: PATCH ANGIOPLASTY;  Surgeon: Mal Misty, MD;  Location: [place];  Service: Vascular;  Laterality: Right;   TUBAL LIGATION     SOCIAL HISTORY  reports that she has never smoked. She has never used smokeless tobacco. She reports that she does not drink alcohol or use drugs.  ALLERGIES No Known Allergies  Family History  Problem Relation Age of Onset   Heart failure Mother    Heart disease Mother    Stroke Father    Heart disease Sister    Diabetes Son    Hypertension Sister    Hypertension Sister    Stroke Sister    Prior to Admission medications   Medication Sig Start Date End Date Taking? Authorizing Provider  aspirin EC 81 MG EC tablet Take 1 tablet (81 mg total) by mouth daily. 06/02/17   Johnson, Clanford L, MD  CARTIA XT 240 MG 24 hr capsule TAKE 1 CAPSULE BY MOUTH ONCE DAILY 04/06/18   Herminio Commons, MD  cephALEXin (KEFLEX) 500 MG capsule Take 1 capsule (500 mg  total) by mouth 3 (three) times daily. 03/19/18   Nickel, Sharmon Leyden, NP  diltiazem (CARDIZEM CD) 240 MG 24 hr capsule Take 240 mg by mouth daily.    [provider]  gabapentin (NEURONTIN) 100 MG capsule Take 100 mg by mouth 3 (three) times daily. 02/09/18   [provider]  hydroxyurea (HYDREA) 500 MG capsule Take 2 capsules (1,000 mg total) by mouth daily. May take with food to minimize GI side effects. 07/14/18   Higgs, Mathis Dad, MD  levothyroxine (SYNTHROID, LEVOTHROID) 125 MCG tablet Take 125 mcg by mouth daily before breakfast.  11/03/17   [provider]  pravastatin (PRAVACHOL) 20 MG tablet Take 10 mg by mouth daily. 08/08/17   [provider]  torsemide (DEMADEX) 20 MG tablet TAKE 1 TABLET BY MOUTH ONCE DAILY AS NEEDED **MAY  TAKE  FOR  WEIGHT  GAIN  OF  3  LBS  OVER  24  HOURS** 10/25/18   Herminio Commons, MD  traMADol (ULTRAM) 50 MG tablet Take 1 tablet (50 mg total) by mouth every 6 (six) hours as needed. 02/19/18    Nickel, Sharmon Leyden, NP  warfarin (COUMADIN) 5 MG tablet Take 1 tablet daily except 1/2 tablet on Sundays, Tuesdays and Thursdays or as directed 11/23/18   Herminio Commons, MD   Physical Exam: Vitals:   01/25/2019 1302 01/13/2019 1323 01/14/2019 1330 01/29/2019 1400  BP:  99/64 105/73 97/66  Pulse:  100    Resp: (!) 28 (!) 22 (!) 25 (!) 24  Temp:      TempSrc:      SpO2: (!) 88% 95%    Weight:      Height:       Constitutional: Thin chronically ill-appearing Caucasian female in mild respiratory distress appears a little uncomfortable but is calm Eyes: Lids and conjunctivae normal, sclerae anicteric  ENMT: External Ears, Nose appear normal. Extremely hard of hearing and have to yell into the Right Ear Neck: Appears normal, supple, no cervical masses, normal ROM, no appreciable thyromegaly; no appreciable JVD Respiratory: Diminished to auscultation bilaterally with coarse breath sounds and mild diffuse crackles; No appreciable wheezing, rales, rhonchi or crackles. Increased respiratory effort and patient is slightly tachypenic. No accessory muscle use. Wearing 6 Liters of Supplemental O2 via [place] Cardiovascular: Irregularly Irregular, no murmurs / rubs / gallops. S1 and S2 auscultated. 1+ LE extremity edema slightly worse on Right than Left Abdomen: Soft, non-tender, non-distended. No masses palpated. No appreciable hepatosplenomegaly. Bowel sounds positive x4.  GU: Deferred. 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 No induration; Warm and dry.  Neurologic: CN 2-12 grossly intact with no focal deficits except that she is significantly hard of hearing. Romberg sign and cerebellar reflexes not assessed.  Psychiatric: Normal judgment and insight. Alert and oriented x 3. Anxious mood and appropriate affect.   Labs on Admission: I have personally reviewed following labs and imaging  studies  CBC: Recent Labs  Lab 01/12/2019 1200 01/18/2019 1426  WBC 6.0  --   HGB 9.3*  --   HCT 29.4*  --   MCV 134.2*  --   PLT 248 852   Basic Metabolic Panel: Recent Labs  Lab 01/07/2019 1200  NA 138  K 4.5  CL 105  CO2 20*  GLUCOSE 106*  BUN 53*  CREATININE 1.76*  CALCIUM 8.6*   GFR: Estimated Creatinine Clearance:  30.4 mL/min (A) (by C-G formula based on SCr of 1.76 mg/dL (H)). Liver Function Tests: Recent Labs  Lab 01/24/2019 1200 01/29/2019 1426  AST 14* 14*  ALT 10 7  ALKPHOS 74 72  BILITOT 1.4* 1.2  PROT 6.6 6.6  ALBUMIN 3.6 3.6   No results for input(s): LIPASE, AMYLASE in the last 168 hours. No results for input(s): AMMONIA in the last 168 hours. Coagulation Profile: Recent Labs  Lab 01/30/2019 1246 01/21/2019 1426  INR 3.7* 3.7*   Cardiac Enzymes: Recent Labs  Lab 01/09/2019 1200  TROPONINI <0.03   BNP (last 3 results) No results for input(s): PROBNP in the last 8760 hours. HbA1C: No results for input(s): HGBA1C in the last 72 hours. CBG: No results for input(s): GLUCAP in the last 168 hours. Lipid Profile: No results for input(s): CHOL, HDL, LDLCALC, TRIG, CHOLHDL, LDLDIRECT in the last 72 hours. Thyroid Function Tests: Recent Labs    01/07/2019 1246  TSH 0.141*   Anemia Panel: Recent Labs    01/14/2019 1426  FOLATE 12.7  RETICCTPCT 2.2   Urine analysis:    Component Value Date/Time   COLORURINE YELLOW 07/03/2017 1013   APPEARANCEUR CLEAR 07/03/2017 1013   LABSPEC 1.010 07/03/2017 1013   PHURINE 7.0 07/03/2017 1013   GLUCOSEU NEGATIVE 07/03/2017 1013   HGBUR NEGATIVE 07/03/2017 1013   [place] 07/03/2017 1013   [place] 07/03/2017 1013   PROTEINUR NEGATIVE 07/03/2017 1013   UROBILINOGEN 1.0 09/14/2012 2337   NITRITE NEGATIVE 07/03/2017 1013   LEUKOCYTESUR NEGATIVE 07/03/2017 1013   Sepsis Labs: !!!!!!!!!!!!!!!!!!!!!!!!!!!!!!!!!!!!!!!!!!!! @LABRCNTIP (procalcitonin:4,lacticidven:4) ) Recent Results (from the  past 240 hour(s))  Blood culture (routine x 2)     Status: None (Preliminary result)   Collection Time: 01/09/2019 12:00 PM  Result Value Ref Range Status   Specimen Description BLOOD RIGHT ARM  Final   Special Requests   Final    Blood Culture adequate volume BOTTLES DRAWN AEROBIC AND ANAEROBIC Performed at [facility], [address], [place], [place] [address]    Culture PENDING  Incomplete   Report Status PENDING  Incomplete  SARS Coronavirus 2 (CEPHEID - Performed in [place] hospital lab), Hosp Order     Status: None   Collection Time: 01/19/2019 12:17 PM  Result Value Ref Range Status   SARS Coronavirus 2 NEGATIVE NEGATIVE Final    Comment: (NOTE) If result is NEGATIVE SARS-CoV-2 target nucleic acids are NOT DETECTED. The SARS-CoV-2 RNA is generally detectable in upper and lower  respiratory specimens during the acute phase of infection. The lowest  concentration of SARS-CoV-2 viral copies this assay can detect is 250  copies / mL. A negative result does not preclude SARS-CoV-2 infection  and should not be used as the sole basis for treatment or other  patient management decisions.  A negative result may occur with  improper specimen collection / handling, submission of specimen other  than nasopharyngeal swab, presence of viral mutation(s) within the  areas targeted by this assay, and inadequate number of viral copies  (<250 copies / mL). A negative result must be combined with clinical  observations, patient history, and epidemiological information. If result is POSITIVE SARS-CoV-2 target nucleic acids are DETECTED. The SARS-CoV-2 RNA is generally detectable in upper and lower  respiratory specimens dur ing the acute phase of infection.  Positive  results are indicative of active infection with SARS-CoV-2.  Clinical  correlation with patient history and other diagnostic information is  necessary to determine patient infection status.  Positive results do  not rule out  bacterial infection or co-infection with other viruses. If result is PRESUMPTIVE POSTIVE SARS-CoV-2 nucleic acids MAY BE PRESENT.   A presumptive positive result was obtained on the submitted specimen  and confirmed on repeat testing.  While 2019 novel coronavirus  (SARS-CoV-2) nucleic acids may be present in the submitted sample  additional confirmatory testing may be necessary for epidemiological  and / or clinical management purposes  to differentiate between  SARS-CoV-2 and other Sarbecovirus currently known to infect humans.  If clinically indicated additional testing with an alternate test  methodology [phone]) is advised. The SARS-CoV-2 RNA is generally  detectable in upper and lower respiratory sp ecimens during the acute  phase of infection. The expected result is Negative. Fact Sheet for Patients:  StrictlyIdeas.no Fact Sheet for Healthcare Providers: BankingDealers.co.za This test is not yet approved or cleared by the [place] FDA and has been authorized for detection and/or diagnosis of SARS-CoV-2 by FDA under an Emergency Use Authorization (EUA).  This EUA will remain in effect (meaning this test can be used) for the duration of the COVID-19 declaration under Section 564(b)(1) of the Act, 21 U.S.C. section 360bbb-3(b)(1), unless the authorization is terminated or revoked sooner. Performed at [facility], [address], [place], [place] [address]   Blood culture (routine x 2)     Status: None (Preliminary result)   Collection Time: 01/25/2019 12:46 PM  Result Value Ref Range Status   Specimen Description LEFT ANTECUBITAL  Final   Special Requests   Final    BOTTLES DRAWN AEROBIC AND ANAEROBIC Blood Culture adequate volume Performed at [facility], [address] [place], [place] [address]    Culture PENDING  Incomplete   Report Status PENDING  Incomplete    Radiological Exams on Admission: Dg Chest Port 1  View  Result Date: 01/07/2019 CLINICAL DATA:  Shortness of breath with COPD and atrial fibrillation. EXAM: PORTABLE CHEST 1 VIEW COMPARISON:  07/03/2017 FINDINGS: Stable densities in the mid and lower chest are most compatible with bilateral pleural effusions with compressive atelectasis. Cannot exclude mild edema or central vascular congestion. Stable enlargement of the cardiac silhouette. IMPRESSION: Slightly enlarged central vascular structures compared to the previous examination and concerning for mild edema. Persistent bibasilar chest densities are most compatible with moderate sized pleural effusions with compressive atelectasis or associated airspace disease. Probable cardiomegaly. Electronically Signed   By: Markus Daft M.D.   On: 01/31/2019 13:15   EKG: Independently reviewed.  Atrial fibrillation with rapid ventricular rate and a QTC of 481.  Low voltage leads were noted.  Assessment/Plan Active Problems:   Polycythemia vera (HCC)   Chronic atrial fibrillation   Anticoagulant long-term use   HTN (hypertension)   Hypothyroidism   PVD (peripheral vascular disease) (HCC)   Dyspnea   Hypoxemia   CHF (congestive heart failure) (HCC)   Acute on chronic respiratory failure with hypoxia (HCC)   Anemia   Chronic respiratory failure with hypoxia (HCC)   Hyperlipidemia   Acute renal failure (ARF) (HCC)   GERD (gastroesophageal reflux disease)   Iron deficiency anemia due to chronic blood loss   Atrial fibrillation with RVR ([place])  Dyspnea/Acute on Chronic Respiratory Failure with Hypoxia -Multifactorial and likely in the setting of symptomatic anemia from suspected GI bleed, atrial fibrillation with RVR, and Hx of COPD -FOBT was positive in the ED and will continue monitor hemoglobin/hematocrit and have gastroenterology weigh in -We will place on a Cardizem drip and placed  in stepdown unit and control heart rates improved -Check echocardiogram -We will place on Xopenex/Atrovent every 6  scheduled for breathing difficulties -Currently holding Coumadin -Will obtain CT of the Chest w/o Contrast to rule out other etiologies -Chest x-ray showedslightly enlarged central vascular structures compared to the previous examination concerning for mild edema there is persistent bibasilar chest densities are most compatible with moderate sized pleural effusion with compressive atelectasis or airspace disease and probable cardiomegaly -We will need to evaluate the chest and consult interventional radiology for likely thoracentesis if needed -Currently patient wears 4 L of oxygen at home and had to be bumped up to 6 L -Currently holding her Torsemide given lower BP but may stop fluids and reinitiate if she starts to show significant volume overload -She is afebrile has no leukocytosis and is less concern for pneumonia -SARS-CoV-2 is negative  Hypothyroidism -Patient's TSH was 0.141 and low -Check Free T4 -Continue home Levothyroxine of 125 mcg p.o. daily before breakfast  Hyperlipidemia -Continue with Pravastatin 10 mg p.o. nightly  Chronic Combined CHF -Appears slightly volume overloaded and had lower extremity edema but patient states this is chronic setting of her recent surgeries and bypass with the right being worse than left -Chest x-ray showed slightly enlarged central vascular structures compared to the previous examination concerning for mild edema there is persistent bibasilar chest densities are most compatible with moderate sized pleural effusion with compressive atelectasis or airspace disease and probable cardiomegaly -EF is 60 to 65% in 2018 with indeterminate diastolic function due to atrial fibrillation and then D-shaped intraventricular septum was suggestive of right ventricular pressure/volume overload.  The left atrium was also severely dilated and the right ventricle systolic function was moderately reduced was that the patient had normal left ventricular size with an EF  of 65% and she had severe biatrial enlargement and moderate pulmonary hypertension -Pete echocardiogram this visit -Check BNP and Fluid Restrict to 1500 mL -Strict I's and O's and daily weights -Continue to monitor for signs and symptoms of volume overload as patient is getting gently hydrated with 50 mL's of normal saline per hour for 10 hours -Repeat chest x-ray in a.m. and continue to monitor volume status carefully.  GERD -Given IV Pantoprazole 40 mg Once  Chronic Atrial Fibrillation now in RVR -Patient presented with rapid A. fib and RVR and was given a 500 mL bolus with improvement in heart rate -Cardizem was ordered but never initiated given her low blood pressures will now be initiated -Hold all antihypertensives but will resume home Cardizem of 240 mg daily when she is off of Cardizem drip -Pacing stepdown unit and continue IV fluid hydration with normal saline at 50 mL's per hour for 20 hours -Initial cardiac troponin was less than 0.03 will continue to monitor and trend -Hold Coumadin at this time given supratherapeutic INR -Continue to monitor on telemetry -If not Improving will need to touch base with Cardiology   Lactic Acidosis -Was mildly elevated at 2.2 is now trended down to 1.1 -Continue gentle IV fluid hydration with normal saline rate of 50 mL's per hour for 10 hours  Peripheral Vascular Disease -Recently saw Dr. [name] and had a amputation of the fifth pinky toe -Currently holding aspirin 81 mg given FOBT positive and supratherapeutic INR  Acute Kidney Injury on CKD Stage 3 -Patient's Baseline Cr appears to be from 0.8-1.1 -BUN/Cr on Admission was 53/1.76 questionable if this is from blood loss -Avoid Nephrotoxic Medications, Contrast Dyes, and Hypotension -Given IVF Hydration with  NS 500 mL in the ED and will start gentle IV fluid hydration at 50 mL's per hour for 10 hours and then -Will need judicious use of Fluids given Hx of Combined CHF -Currently Holding  Torsemide given low blood pressures and AKI -Continue to Monitor and Trend Renal Fxn -Repeat CBC in AM   Macrocytic Anemia/?Chronic Blood Loss Anemia/ Multifactorial Anemia including ?Hemolysis History of Polycythemia Vera on Hydroxyurea -Patient's Hb/Hct on Admission was 9.3/29.4 on Admission -In December Hb was 15.1 but has ranges previously from Mid 8-10's -Has a documented Hx of Anemia of Chronic Blood Loss -FOBT was Positive and she has a Supratherapeutic INR; Schistoyctes were seen on Peripheral Smear but we will be checking an LDH, haptoglobin, reticulocyte count as well as an anemia panel -I discussed the case with hematology Dr. Delton Coombes who feels that the patient can have a slight schistocytosis in the setting of renal insufficiency -Will Check LDH and was 629, Haptoglobin, reticulocyte count, direct and indirect bilirubin as well as a DIC panel -Hold Hydroxyurea at the recommendation of hematology -Given IV Protonix 40 mg in the ED; Hold Coumadin for now   -Will get GI opinion and Appreciate Dr. Roseanne Kaufman evaluation and he will see the patient tomorrow   Hyperbilirubinemia -Paitent's T Bili was 1.4 and then repeated was 1.2 with a direct bilirubin of 0.4 and an indirect of 0.8 -Likley in the setting of ? Hemolysis but could also be from Hydroxyurea -I discussed the case with medical oncology and hematology Dr. Delton Coombes who recommends holding the hydroxyurea at this time and recommends trending the bilirubin and getting a direct and indirect bilirubin which was done above -Continue to Monitor and Trend -Repeat CMP in AM   DVT prophylaxis: SCDs; Currently will hold Coumadin given Supratherapeutic INR Code Status: FULL CODE  Family Communication: No family present at bedside Disposition Plan: Remain Inpatient until HR improved and Breathing back to baseline  Consults called: Gastroenterology Dr. Gala Romney by EDP; Hematology Dr. Delton Coombes  Admission status: Inpatient SDU  Severity  of Illness: The appropriate patient status for this patient is INPATIENT. Inpatient status is judged to be reasonable and necessary in order to provide the required intensity of service to ensure the patient's safety. The patient's presenting symptoms, physical exam findings, and initial radiographic and laboratory data in the context of their chronic comorbidities is felt to place them at high risk for further clinical deterioration. Furthermore, it is not anticipated that the patient will be medically stable for discharge from the hospital within 2 midnights of admission. The following factors support the patient status of inpatient.   " The patient's presenting symptoms include SOB. " The worrisome physical exam findings include Some LE Edema and Increasing O2 requrements. " The initial radiographic and laboratory data are worrisome because of ? Edema, AKI, and Hemoglobin drop from baseline " The chronic co-morbidities are as above  * I certify that at the point of admission it is my clinical judgment that the patient will require inpatient hospital care spanning beyond 2 midnights from the point of admission due to high intensity of service, high risk for further deterioration and high frequency of surveillance required.Kerney Elbe, D.O. [facility] is on [place]  If 7PM-7AM, please contact night-coverage [email or web address] Password [facility]  02/01/2019, 3:48 PM

## 2019-01-08 NOTE — ED Notes (Signed)
Increased nasal canula to 5 liters

## 2019-01-09 ENCOUNTER — Inpatient Hospital Stay (HOSPITAL_COMMUNITY): Payer: Medicare HMO

## 2019-01-09 ENCOUNTER — Encounter (HOSPITAL_COMMUNITY): Payer: Self-pay | Admitting: Cardiology

## 2019-01-09 DIAGNOSIS — I50813 Acute on chronic right heart failure: Secondary | ICD-10-CM

## 2019-01-09 DIAGNOSIS — D649 Anemia, unspecified: Secondary | ICD-10-CM

## 2019-01-09 DIAGNOSIS — I371 Nonrheumatic pulmonary valve insufficiency: Secondary | ICD-10-CM

## 2019-01-09 LAB — CBC WITH DIFFERENTIAL/PLATELET
Abs Immature Granulocytes: 0.11 10*3/uL — ABNORMAL HIGH (ref 0.00–0.07)
Basophils Absolute: 0.1 10*3/uL (ref 0.0–0.1)
Basophils Relative: 2 %
Eosinophils Absolute: 0.2 10*3/uL (ref 0.0–0.5)
Eosinophils Relative: 3 %
HCT: 27.2 % — ABNORMAL LOW (ref 36.0–46.0)
Hemoglobin: 8.5 g/dL — ABNORMAL LOW (ref 12.0–15.0)
Immature Granulocytes: 2 %
Lymphocytes Relative: 8 %
Lymphs Abs: 0.5 10*3/uL — ABNORMAL LOW (ref 0.7–4.0)
MCH: 41.5 pg — ABNORMAL HIGH (ref 26.0–34.0)
MCHC: 31.3 g/dL (ref 30.0–36.0)
MCV: 132.7 fL — ABNORMAL HIGH (ref 80.0–100.0)
Monocytes Absolute: 0.3 10*3/uL (ref 0.1–1.0)
Monocytes Relative: 6 %
Neutro Abs: 4.6 10*3/uL (ref 1.7–7.7)
Neutrophils Relative %: 79 %
Platelets: 195 10*3/uL (ref 150–400)
RBC: 2.05 MIL/uL — ABNORMAL LOW (ref 3.87–5.11)
RDW: 20.7 % — ABNORMAL HIGH (ref 11.5–15.5)
WBC: 5.9 10*3/uL (ref 4.0–10.5)
nRBC: 0.7 % — ABNORMAL HIGH (ref 0.0–0.2)

## 2019-01-09 LAB — COMPREHENSIVE METABOLIC PANEL
ALT: 9 U/L (ref 0–44)
ALT: 9 U/L (ref 0–44)
AST: 13 U/L — ABNORMAL LOW (ref 15–41)
AST: 13 U/L — ABNORMAL LOW (ref 15–41)
Albumin: 3.3 g/dL — ABNORMAL LOW (ref 3.5–5.0)
Albumin: 3.4 g/dL — ABNORMAL LOW (ref 3.5–5.0)
Alkaline Phosphatase: 65 U/L (ref 38–126)
Alkaline Phosphatase: 64 U/L (ref 38–126)
Anion gap: 7 (ref 5–15)
Anion gap: 12 (ref 5–15)
BUN: 53 mg/dL — ABNORMAL HIGH (ref 8–23)
BUN: 54 mg/dL — ABNORMAL HIGH (ref 8–23)
CO2: 21 mmol/L — ABNORMAL LOW (ref 22–32)
CO2: 20 mmol/L — ABNORMAL LOW (ref 22–32)
Calcium: 8.3 mg/dL — ABNORMAL LOW (ref 8.9–10.3)
Calcium: 8.4 mg/dL — ABNORMAL LOW (ref 8.9–10.3)
Chloride: 108 mmol/L (ref 98–111)
Chloride: 104 mmol/L (ref 98–111)
Creatinine, Ser: 1.63 mg/dL — ABNORMAL HIGH (ref 0.44–1.00)
Creatinine, Ser: 1.66 mg/dL — ABNORMAL HIGH (ref 0.44–1.00)
GFR calc Af Amer: 35 mL/min — ABNORMAL LOW (ref >60)
GFR calc Af Amer: 34 mL/min — ABNORMAL LOW (ref >60)
GFR calc non Af Amer: 30 mL/min — ABNORMAL LOW (ref >60)
GFR calc non Af Amer: 30 mL/min — ABNORMAL LOW (ref >60)
Glucose, Bld: 109 mg/dL — ABNORMAL HIGH (ref 70–99)
Glucose, Bld: 97 mg/dL (ref 70–99)
Potassium: 4.2 mmol/L (ref 3.5–5.1)
Potassium: 3.9 mmol/L (ref 3.5–5.1)
Sodium: 136 mmol/L (ref 135–145)
Sodium: 136 mmol/L (ref 135–145)
Total Bilirubin: 1.6 mg/dL — ABNORMAL HIGH (ref 0.3–1.2)
Total Bilirubin: 1.4 mg/dL — ABNORMAL HIGH (ref 0.3–1.2)
Total Protein: 6 g/dL — ABNORMAL LOW (ref 6.5–8.1)
Total Protein: 6.3 g/dL — ABNORMAL LOW (ref 6.5–8.1)

## 2019-01-09 LAB — ECHOCARDIOGRAM COMPLETE
Height: 72 in
Weight: 2599.66 oz

## 2019-01-09 LAB — PROTIME-INR
INR: 3.8 — ABNORMAL HIGH (ref 0.8–1.2)
Prothrombin Time: 36.5 seconds — ABNORMAL HIGH (ref 11.4–15.2)

## 2019-01-09 LAB — PHOSPHORUS: Phosphorus: 4.2 mg/dL (ref 2.5–4.6)

## 2019-01-09 LAB — T4, FREE: Free T4: 1.61 ng/dL (ref 0.82–1.77)

## 2019-01-09 LAB — BLOOD GAS, ARTERIAL
Acid-base deficit: 4.9 mmol/L — ABNORMAL HIGH (ref 0.0–2.0)
Bicarbonate: 20.5 mmol/L (ref 20.0–28.0)
FIO2: 40
O2 Content: 40 L/min
O2 Saturation: 87.2 %
Patient temperature: 37
pCO2 arterial: 30.5 mmHg — ABNORMAL LOW (ref 32.0–48.0)
pH, Arterial: 7.409 (ref 7.350–7.450)
pO2, Arterial: 54.5 mmHg — ABNORMAL LOW (ref 83.0–108.0)

## 2019-01-09 LAB — MAGNESIUM: Magnesium: 2.3 mg/dL (ref 1.7–2.4)

## 2019-01-09 LAB — TROPONIN I: Troponin I: <0.03 ng/mL (ref <0.03)

## 2019-01-09 LAB — HAPTOGLOBIN: Haptoglobin: 124 mg/dL (ref 42–346)

## 2019-01-09 LAB — HEMOGLOBIN A1C
Hgb A1c MFr Bld: 4.9 % (ref 4.8–5.6)
Mean Plasma Glucose: 93.93 mg/dL

## 2019-01-09 LAB — GLUCOSE, CAPILLARY: Glucose-Capillary: 97 mg/dL (ref 70–99)

## 2019-01-09 MED ORDER — PANTOPRAZOLE SODIUM 40 MG PO TBEC
40.0000 mg | DELAYED_RELEASE_TABLET | Freq: Every day | ORAL | Status: DC
  Administered 2019-01-09 – 2019-01-17 (×9): 40 mg via ORAL
  Filled 2019-01-09 (×9): qty 1

## 2019-01-09 NOTE — Progress Notes (Signed)
ANTICOAGULATION CONSULT NOTE -   Pharmacy Consult for warfarin Indication: atrial fibrillation  No Known Allergies  Patient Measurements: Height: 6' (182.9 cm) Weight: 162 lb 7.7 oz (73.7 kg) IBW/kg (Calculated) : 73.1   Vital Signs: Temp: 98.3 F (36.8 C) (06/07 0739) Temp Source: Oral (06/07 0739) BP: 92/58 (06/07 0645) Pulse Rate: 116 (06/07 0739)  Labs: Recent Labs    01/13/2019 1200 01/21/2019 1246 01/12/2019 1423 01/23/2019 1426 01/16/2019 2137 01/09/19 0330 01/09/19 0331  HGB 9.3*  --   --   --   --   --  8.5*  HCT 29.4*  --   --   --   --   --  27.2*  PLT 248  --   --  200  --   --  195  APTT  --   --   --  80*  --   --   --   LABPROT  --  36.0*  --  35.7*  --   --  36.5*  INR  --  3.7*  --  3.7*  --   --  3.8*  CREATININE 1.76*  --   --   --   --   --  1.63*  TROPONINI <0.03  --  0.03*  --  <0.03 <0.03  --     Estimated Creatinine Clearance: 33.9 mL/min (A) (by C-G formula based on SCr of 1.63 mg/dL (H)).   Medical History: Past Medical History:  Diagnosis Date  . Anxiety   . Arthritis   . Atrial fibrillation, chronic    a. on Coumadin for anticoagulation.   . Cellulitis 06/29/2017   RIGHT LOWER LEG  . CHF (congestive heart failure) ([place])    combined  . COPD (chronic obstructive pulmonary disease) ([place])    on home O2 4 lpm  . Deaf   . 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 DVT (deep venous thrombosis) ([place])   . Essential hypertension   . GERD (gastroesophageal reflux disease)   . HOH (hard of hearing)   . Hyperlipidemia   . Hypothyroidism   . Iron deficiency anemia due to chronic blood loss 03/18/2018  . Open wound    right groin  . Oxygen dependent    4 lpm  . Peripheral neuropathy   . Peripheral vascular disease ([place])   . 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 Ulcer of ankle ([place])    Bilateral 2015  . Varicose veins     Medications:  Medications Prior to Admission  Medication Sig Dispense Refill Last Dose  . acetaminophen (PAIN RELIEVER) 500 MG  tablet Take 1,000 mg by mouth every 6 (six) hours as needed for mild pain or moderate pain.   01/16/2019 at Unknown time  . aspirin EC 81 MG EC tablet Take 1 tablet (81 mg total) by mouth daily.   01/12/2019 at Unknown time  . CARTIA XT 240 MG 24 hr capsule TAKE 1 CAPSULE BY MOUTH ONCE DAILY (Patient taking differently: Take 240 mg by mouth every morning. ) 90 capsule 3 01/11/2019 at Unknown time  . gabapentin (NEURONTIN) 300 MG capsule Take 300 mg by mouth 3 (three) times daily.   01/30/2019 at Unknown time  . hydroxyurea (HYDREA) 500 MG capsule Take 2 capsules (1,000 mg total) by mouth daily. May take with food to minimize GI side effects. (Patient taking differently: Take 1,000 mg by mouth every morning. May take with food to minimize GI side effects.) 180 capsule 3 01/24/2019 at Unknown  time  . levothyroxine (SYNTHROID) 112 MCG tablet Take 112 mcg by mouth every morning.   01/24/2019 at Unknown time  . pravastatin (PRAVACHOL) 20 MG tablet Take 10 mg by mouth at bedtime.    01/07/2019 at Unknown time  . torsemide (DEMADEX) 20 MG tablet TAKE 1 TABLET BY MOUTH ONCE DAILY AS NEEDED **MAY  TAKE  FOR  WEIGHT  GAIN  OF  3  LBS  OVER  24  HOURS** (Patient taking differently: Take 20 mg by mouth daily as needed (for weight gain over 3 pounds). ) 30 tablet 6 01/22/2019 at Unknown time  . warfarin (COUMADIN) 5 MG tablet Take 1 tablet daily except 1/2 tablet on Sundays, Tuesdays and Thursdays or as directed (Patient taking differently: Take 2.5-5 mg by mouth See admin instructions. Take 1 tablet (5mg  total) daily except 1/2 tablet (2.5mg  total) on Sundays, Tuesdays and Thursdays or as directed.) 71 tablet 2 01/07/2019 at 1800    Assessment: Pharmacy consulted to dose warfarin for patient with atrial fibrillation.  Patient's INR is supratherapeutic at 3.8.  Home dose of warfarin is listed as 2.5 mg Sun,Tue,Thu; 5 mg ROW.  Goal of Therapy:  INR 2-3 Monitor platelets by anticoagulation protocol: Yes   Plan:  Hold warfarin x 1  dose. Monitor daily INR and s/s of bleeding.  Revonda Standard Edmond Ginsberg 01/09/2019,7:46 AM

## 2019-01-09 NOTE — Progress Notes (Signed)
Patient is asleep without symptoms, heart rate monitor still in out fast rhythm. Saturation 93

## 2019-01-09 NOTE — Progress Notes (Signed)
*  PRELIMINARY RESULTS* Echocardiogram 2D Echocardiogram has been performed.  Leavy Cella 01/09/2019, 1:38 PM

## 2019-01-09 NOTE — Progress Notes (Signed)
Patient is on 7 lpm HFNC she normally wears 4 lpm at home. On Cardizem -afib , getting lasix for CHF, has pleural effusion's which she has had before or most likely keeps to some degree. No particular wheezes or crackles heard with breath sounds.

## 2019-01-09 NOTE — Progress Notes (Signed)
PROGRESS NOTE    Charlotte [name]  MVE:[id number] DOB: 07/17/1941 DOA: 01/06/2019 PCP: [name], MD   Brief Narrative:  HPI: Charlotte [name] is a 78 y.o. 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  States she has not been around any sick contacts and denies any chest pain, lightheadedness or dizziness but states that her breathing is significantly worsened and she currently does wear 4 L oxygen.  He is not having any cough or productive sputum and denies any fevers.  States that she is also been feeling weaker and a little bit more tired.  She was worked up and found to be in atrial fibrillation RVR and was given a small 500 mL bolus with improvement in her heart rate.  Patient still remained dyspneic and oxygen requirement had to be escalated to 6 L. TRH was called admit this patient for her acute on chronic hypoxic respiratory failure and dyspnea and is also found that she had a worsening anemia and was found to be FOBT positive so GI was consulted and will see the patient in the a.m.  ED Course: She had basic blood work done including CBC, CMP, had a chest x-ray done EKG and given a 500 mL bolus.  She was given 40 mg of IV Protonix and gastroenterology Dr. Gala Romney was consulted and he will see the patient tomorrow for her anemia and FOBT positive.  **Interim History CT of the chest without contrast was done and showed congestive  heart failure with large bilateral pleural effusions with significant atelectasis on both lower lobes and mild interstitial pulmonary edema along with aortic atherosclerosis.  IV fluids were discontinued and patient was started on diuresis.  She continues to be on the Cardizem drip at 5 and we will attempt to titrate to 7.5 mg/hr.  Echocardiogram was done today and showed a normal EF however diastolic dysfunction could not be assessed because of her A. fib and right ventricle did show severely reduced systolic function.  I spoke with cardiology Dr. Domenic Polite who will evaluate the patient tomorrow.  GI has been evaluating the patient and feel no indication for endoscopy or colonoscopy given her tenuous status and supratherapeutic INR.  We have also ordered an interventional radiology thoracentesis to be done however INR still supratherapeutic and will need to wait till this trends down.  Hematology is to evaluate tomorrow given the patient's anemia.  Assessment & Plan:   Active Problems:   Polycythemia vera (HCC)   Chronic atrial fibrillation   Anticoagulant long-term use   HTN (hypertension)   Hypothyroidism   PVD (peripheral vascular disease) (HCC)   Dyspnea   Hypoxemia   CHF (congestive heart failure) (HCC)   Acute on chronic respiratory failure with hypoxia (HCC)   Anemia   Chronic respiratory failure with hypoxia (HCC)   Hyperlipidemia   Acute renal failure (ARF) (HCC)   GERD (gastroesophageal reflux disease)   Iron deficiency anemia due to chronic blood loss   Atrial fibrillation with RVR ([place])  Dyspnea/Acute on Chronic Respiratory  Failure with Hypoxia -Multifactorial and likely in the setting of symptomatic anemia from suspected GI bleed, atrial fibrillation with RVR, and Hx of COPD along with now CHF and bilateral pleural effusions -FOBT was positive in the ED and will continue monitor hemoglobin/hematocrit and have gastroenterology weigh in -We will place on a Cardizem drip and  placed in stepdown unit and control heart rates improved -Checked echocardiogram as below -We will place on Xopenex/Atrovent every 6 scheduled for breathing difficulties -Currently holding Coumadin -Will obtain CT of the Chest w/o Contrast to rule out other etiologies and CT scan showed CHF with bilateral pleural effusions and aortic atherosclerosis -Admission chest x-ray showedslightly enlarged central vascular structures compared to the previous examination concerning for mild edema there is persistent bibasilar chest densities are most compatible with moderate sized pleural effusion with compressive atelectasis or airspace disease and probable cardiomegaly -Today's chest x-ray showed "Increased bilateral airspace disease is suggestive for pulmonary edema. Persistent bilateral pleural effusions." -We will need to evaluate the chest and consult interventional radiology for likely thoracentesis if needed ~has been placed -Currently patient wears 4 L of oxygen at home and had to be bumped up to 7 L high flow nasal cannula -Currently holding her Torsemide given lower BP; IV fluids have now been stopped and diuresis has been starting -She is afebrile has no leukocytosis and is less concern for pneumonia -SARS-CoV-2 is negative  Hypothyroidism -Patient's TSH was 0.141 and low -Checked Free T4 and was within normal limits of 1.61 -Continue home Levothyroxine of 125 mcg p.o. daily before breakfast  Hyperlipidemia -Continue with Pravastatin 10 mg p.o. nightly  Acute Diastolic CHF and Severe Right Sided Ventricle Dysfuntion in a patient with Chronic Combined CHF -Appeared slightly volume overloaded and had lower extremity edema but patient states this is chronic setting of her recent surgeries and bypass with the right being worse than left -Chest x-ray showed slightly enlarged central vascular structures compared to the previous examination concerning for mild edema there is persistent bibasilar  chest densities are most compatible with moderate sized pleural effusion with compressive atelectasis or airspace disease and probable cardiomegaly -EF is 60 to 65% in 2018 with indeterminate diastolic function due to atrial fibrillation and then D-shaped intraventricular septum was suggestive of right ventricular pressure/volume overload.  The left atrium was also severely dilated and the right ventricle systolic function was moderately reduced was that the patient had normal left ventricular size with an EF of 65% and she had severe biatrial enlargement and moderate pulmonary hypertension -Repeat Echocardiogram this visit as below and showed EF of 60-65% -Checked BNP and was 874.0 and Fluid Restrict to 1500 mL -Strict I's and O's and daily weights -Continue to monitor for signs and symptoms of volume overload -IVF has now Discontinued -CT Chest showed congestive heart failure with large bilateral pleural effusions with significant atelectasis of both lobes and mild interstitial pulmonary edema.  There is also noted to aortic atherosclerosis -Repeat chest x-ray in a.m. and continue to monitor volume status carefully.  Bilateral Pleural Effusions -Ordered thoracentesis however may not be able to get done given supratherapeutic INR -Continue with IV Lasix and low pressure permits -Continue monitor strict I's and O's and daily weights  GERD -Given IV Pantoprazole 40 mg Once and will continue po 40 mg daily   Chronic Atrial Fibrillation now in RVR -Patient presented with rapid A. fib and RVR and was given a 500 mL bolus with improvement in heart rate -Cardizem was ordered but never initiated  given her low blood pressures will now be initiated; Currently on 5 mg/hr  -Hold all antihypertensives but will resume home Cardizem of 240 mg daily when she is off of Cardizem drip -Admitted into the stepdown unit and continue IV fluid hydration with normal saline at 50 mL's stopped and now Diuresed    -Initial cardiac troponin was less than 0.03 will continue to monitor and trend -Hold Coumadin at this time given supratherapeutic INR -Continue to monitor on telemetry -If not Improving will need to touch base with Cardiology   Lactic Acidosis -Was mildly elevated at 2.2 is now trended down to 1.1 -Gentle IV fluid hydration with normal saline rate of 50 mL stopped yesterday and diuresis was started given her significant volume overload  Peripheral Vascular Disease -Recently saw Dr. [name] and had a amputation of the fifth pinky toe -Currently holding aspirin 81 mg given FOBT positive and supratherapeutic INR  Acute Kidney Injury on CKD Stage 3 Metabolic Acidosis  -Patient's Baseline Cr appears to be from 0.8-1.1 -BUN/Cr on Admission was 53/1.76 questionable if this is from blood loss but likely this is now in the setting of volume overload from atrial fibrillation with RVR and CHF; BUN/creatinine slightly improved and is 53/1.63 -Avoid Nephrotoxic Medications, Contrast Dyes, and Hypotension -Given IVF Hydration with NS 500 mL in the ED and was started on gentle IV fluid hydration at 50 mL's per hour but then discontinued and started on diuresis -Started IV Lasix 40 mg twice daily -CO2 was 21 and AG is 7 -Currently Holding Torsemide given low blood pressures and AKI -Continue to Monitor and Trend Renal Fxn -Repeat CBC in AM   Macrocytic Anemia/?Chronic Blood Loss Anemia/ Multifactorial Anemia including ?Hemolysis History of Polycythemia Vera on Hydroxyurea -Patient's Hb/Hct on Admission was 9.3/29.4 on Admission and is further drop down now to 8.5/27.2 -In December Hb was 15.1 but has ranges previously from Mid 8-10's -Has a documented Hx of Anemia of Chronic Blood Loss -FOBT was Positive and she has a Supratherapeutic INR; Schistoyctes were seen on Peripheral Smear but we will be checking an LDH, haptoglobin, reticulocyte count as well as an anemia panel -I discussed the case with  hematology Dr. Delton Coombes who feels that the patient can have a slight schistocytosis in the setting of renal insufficiency -Will Check LDH and was 629, Haptoglobin is pending, -Reticulocyte count done and Retic Ct Pct ws 2.22, Absolute Retic Ct was 49.3, and Immature Retic Fract was 32.6 -Anemia panel done and showed an iron level of 34, U IBC 186, TIBC of 220, saturation ratios of 15%, ferritin level 161, folate level 12.7, and vitamin B12 level of 714 -DIC panel done and showed a d-dimer of 0.28, fibrinogen level of 366, PT of 35.7, INR 3.7, APTT of 80, -Hold Hydroxyurea at the recommendation of hematology -Given IV Protonix 40 mg in the ED; Hold Coumadin for now   -Will get GI opinion and Appreciate Dr. Roseanne Kaufman evaluation; Dr. Gala Romney does not feel that the patient is ever going to be a candidate for endoscopic evaluation given her significant comorbidities and feels that the hematologist stool was in the setting of coagulopathy and antiplatelet therapy with aspirin and could be due to any number of etiologies throughout the GI tract currently she is devoid of any GI symptoms and Dr. Gala Romney recommends following clinically and if Coumadin is to be continued she would need better control of her INR and he states that if aspirin is to be continued then she would  need daily acid suppression therapy for cytoprotective effect with a PPI  Hyperbilirubinemia -Paitent's T Bili was 1.4 and then repeated was 1.2 with a direct bilirubin of 0.4 and an indirect of 0.8; NOW T bili is 1.6 -Likley in the setting of ? Hemolysis but could also be from Hydroxyurea -I discussed the case with medical oncology and hematology Dr. Delton Coombes who recommends holding the hydroxyurea at this time and recommends trending the bilirubin and getting a direct and indirect bilirubin which was done above -Gastroenterology recommending not evaluating bilirubin further unless it rises above the 3-4 range and or if the transaminases  bump -Continue to Monitor and Trend -Repeat CMP in AM   DVT prophylaxis: SCDs; Anticoagulated with Coumadin which is currently being held  Code Status: DO NOT RESUSCITATE  Family Communication: No family present at bedside  Disposition Plan: Remain in the SDU  Consultants:   Gastroenterology  Hematology  Cardiology; I spoke with Dr. Domenic Polite who will see the patient tomorrow    Procedures:  ECHOCARDIOGRAM on 01/09/2019 IMPRESSIONS    1. The left ventricle has normal systolic function with an ejection fraction of 60-65%. The cavity size was normal. Left ventricular diastolic function could not be evaluated secondary to atrial fibrillation.  2. The right ventricle has severely reduced systolic function. The cavity was moderately enlarged. There is no increase in right ventricular wall thickness. Right ventricular systolic pressure is moderately elevated with an estimated pressure of 49.1  mmHg.  3. Left atrial size was severely dilated.  4. Right atrial size was severely dilated.  5. Large pleural effusion.  6. Trivial pericardial effusion is present.  7. No evidence of mitral valve stenosis.  8. No stenosis of the aortic valve.  9. The aortic root and ascending aorta are normal in size and structure. 10. The inferior vena cava was normal in size with <50% respiratory variability. 11. The interatrial septum was not assessed.  FINDINGS  Left Ventricle: The left ventricle has normal systolic function, with an ejection fraction of 60-65%. The cavity size was normal. There is no increase in left ventricular wall thickness. Left ventricular diastolic function could not be evaluated  secondary to atrial fibrillation.  Right Ventricle: The right ventricle has severely reduced systolic function. The cavity was moderately enlarged. There is no increase in right ventricular wall thickness. Right ventricular systolic pressure is moderately elevated with an estimated  pressure of 49.1  mmHg. Pacing wire/catheter visualized in the right ventricle.  Left Atrium: Left atrial size was severely dilated.  Right Atrium: Right atrial size was severely dilated. Right atrial pressure is estimated at 3 mmHg.  Interatrial Septum: The interatrial septum was not assessed.  Pericardium: Trivial pericardial effusion is present. There is a large pleural effusion.  Mitral Valve: The mitral valve is normal in structure. Mitral valve regurgitation is trivial by color flow Doppler. No evidence of mitral valve stenosis.  Tricuspid Valve: The tricuspid valve is normal in structure. Tricuspid valve regurgitation is mild by color flow Doppler.  Aortic Valve: The aortic valve is normal in structure. Aortic valve regurgitation was not visualized by color flow Doppler. There is No stenosis of the aortic valve.  Pulmonic Valve: The pulmonic valve was grossly normal. Pulmonic valve regurgitation is trivial by color flow Doppler.  Aorta: The aortic root and ascending aorta are normal in size and structure.  Venous: The inferior vena cava is normal in size with less than 50% respiratory variability.    +--------------+--------++  LEFT VENTRICLE            +----------------+---------++ +--------------+--------++  Diastology                    PLAX 2D                   +----------------+---------++ +--------------+--------++  LV e' lateral:   9.57 cm/s    LVIDd:         3.61 cm    +----------------+---------++ +--------------+--------++  LV E/e' lateral: 12.3         LVIDs:         2.29 cm    +----------------+---------++ +--------------+--------++  LV e' medial:    5.87 cm/s    LV PW:         0.92 cm    +----------------+---------++ +--------------+--------++  LV E/e' medial:  20.1         LV IVS:        0.99 cm    +----------------+---------++ +--------------+--------++  LVOT diam:     1.90 cm    +--------------+--------++  LV SV:         37 ml       +--------------+--------++  LV SV Index:   19.08      +--------------+--------++  LVOT Area:     2.84 cm   +--------------+--------++                            +--------------+--------++  +---------------+----------++  RIGHT VENTRICLE              +---------------+----------++  RV S prime:     12.60 cm/s   +---------------+----------++  RVSP:           54.1 mmHg    +---------------+----------++  +---------------+--------++------------++  LEFT ATRIUM               Index          +---------------+--------++------------++  LA diam:        5.60 cm   2.87 cm/m     +---------------+--------++------------++  LA Vol (A2C):   199.0 ml  102.03 ml/m   +---------------+--------++------------++  LA Vol (A4C):   163.0 ml  83.57 ml/m    +---------------+--------++------------++  LA Biplane Vol: 179.0 ml  91.77 ml/m    +---------------+--------++------------++ +------------+---------++-----------++  RIGHT ATRIUM            Index         +------------+---------++-----------++  RA Pressure: 8.00 mmHg                +------------+---------++-----------++  RA Area:     39.80 cm                +------------+---------++-----------++  RA Volume:   173.00 ml  88.70 ml/m   +------------+---------++-----------++    +-------------+-------++  AORTA                   +-------------+-------++  Ao Root diam: 2.40 cm   +-------------+-------++  +--------------+----------++  +---------------+-----------++  MITRAL VALVE                  TRICUSPID VALVE               +--------------+----------++  +---------------+-----------++  MV Area (PHT): 3.48 cm       TR Peak grad:   46.1 mmHg     +--------------+----------++  +---------------+-----------++  MV PHT:        63.22 msec     TR Vmax:        347.00 cm/s   +--------------+----------++  +---------------+-----------++  MV Decel Time: 218 msec       Estimated RAP:  8.00 mmHg     +--------------+----------++   +---------------+-----------++ +--------------+-----------++  RVSP:           54.1 mmHg      MV E velocity: 118.00 cm/s   +---------------+-----------++ +--------------+-----------++                               +--------------+-------+                                SHUNTS                                                +--------------+-------+                                Systemic Diam: 1.90 cm                                +--------------+-------+    Antimicrobials:  Anti-infectives (From admission, onward)   None     Subjective: Seen And examined at bedside and she remains significantly hard of hearing but thinks that she is doing a little bit better today with her shortness of breath.  No nausea or vomiting denied any complaints but her legs remain still swollen.  Was sitting up eating breakfast.  No other concerns or complaints at this time.  Objective: Vitals:   01/09/19 1200 01/09/19 1300 01/09/19 1400 01/09/19 1449  BP: (!) 103/57 (!) 135/120 (!) 95/53   Pulse: (!) 57 (!) 116 (!) 115   Resp: (!) 21 (!) 26 (!) 24   Temp:      TempSrc:      SpO2: 95% 94% 95% 96%  Weight:      Height:        Intake/Output Summary (Last 24 hours) at 01/09/2019 1556 Last data filed at 01/09/2019 0600 Gross per 24 hour  Intake 777.02 ml  Output 750 ml  Net 27.02 ml   Filed Weights   01/29/2019 1146 01/06/2019 1730 01/09/19 0545  Weight: 70.8 kg 71.7 kg 73.7 kg   Examination: Physical Exam:  Constitutional: Thin Chronically ill-appearing Caucasian female in Some respiratory distress appears more comfortable today and calm Eyes: Lids and conjunctivae normal, sclerae anicteric  ENMT: External Ears, Nose appear normal. Very hard of hearing  Neck: Appears normal, supple, no cervical masses, normal ROM, no appreciable thyromegaly; Mild JVD Respiratory: Diminished to auscultation bilaterally with no appreciable wheezing/rhonchi but has siginficant crackles and coarse breath sounds. Slightly  Tachypenic but using 7 Liters of HFNC Cardiovascular: Irregularly irregular and tachycardic and 1-2+ LE Edema with Right Leg worse than Left Abdomen: Soft, non-tender, non-distended. No masses palpated. No appreciable hepatosplenomegaly. Bowel sounds positive x4.  GU: Deferred. 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 No induration; Warm and dry.  Neurologic: CN 2-12 grossly intact with no focal deficits but she is significantly hard of hearing Psychiatric: Normal judgment and insight. Alert and oriented x 3.  Mildly anxious mood and appropriate affect.  Data Reviewed: I have personally reviewed following labs and imaging studies  CBC: Recent Labs  Lab 01/07/2019 1200 01/24/2019 1426 01/09/19 0331  WBC 6.0  --  5.9  NEUTROABS  --   --  4.6  HGB 9.3*  --  8.5*  HCT 29.4*  --  27.2*  MCV 134.2*  --  132.7*  PLT 248 200 096   Basic Metabolic Panel: Recent Labs  Lab 02/01/2019 1200 01/06/2019 1426 01/09/19 0331  NA 138  --  136  K 4.5  --  4.2  CL 105  --  108  CO2 20*  --  21*  GLUCOSE 106*  --  109*  BUN 53*  --  53*  CREATININE 1.76*  --  1.63*  CALCIUM 8.6*  --  8.3*  MG  --  2.5* 2.3  PHOS  --  4.5 4.2   GFR: Estimated Creatinine Clearance: 33.9 mL/min (A) (by C-G formula based on SCr of 1.63 mg/dL (H)). Liver Function Tests: Recent Labs  Lab 01/12/2019 1200 01/10/2019 1426 01/09/19 0331  AST 14* 14* 13*  ALT 10 7 9   ALKPHOS 74 72 65  BILITOT 1.4* 1.2 1.6*  PROT 6.6 6.6 6.0*  ALBUMIN 3.6 3.6 3.3*   No results for input(s): LIPASE, AMYLASE in the last 168 hours. No results for input(s): AMMONIA in the last 168 hours. Coagulation Profile: Recent Labs  Lab 01/25/2019 1246 01/07/2019 1426 01/09/19 0331  INR 3.7* 3.7* 3.8*   Cardiac Enzymes: Recent Labs  Lab 01/30/2019 1200 01/16/2019 1423 01/18/2019 2137 01/09/19 0330  TROPONINI <0.03 0.03* <0.03 <0.03   BNP (last 3  results) No results for input(s): PROBNP in the last 8760 hours. HbA1C: Recent Labs    01/09/19 0331  HGBA1C 4.9   CBG: Recent Labs  Lab 01/09/19 0738  GLUCAP 97   Lipid Profile: No results for input(s): CHOL, HDL, LDLCALC, TRIG, CHOLHDL, LDLDIRECT in the last 72 hours. Thyroid Function Tests: Recent Labs    01/18/2019 1246 01/19/2019 2137  TSH 0.141*  --   FREET4  --  1.61   Anemia Panel: Recent Labs    01/14/2019 1412 01/24/2019 1426  VITAMINB12 714  --   FOLATE  --  12.7  FERRITIN 161  --   TIBC 220*  --   IRON 34  --   RETICCTPCT  --  2.2   Sepsis Labs: Recent Labs  Lab 01/21/2019 1200 01/24/2019 1426  LATICACIDVEN 2.2* 1.1    Recent Results (from the past 240 hour(s))  Blood culture (routine x 2)     Status: None (Preliminary result)   Collection Time: 01/04/2019 12:00 PM  Result Value Ref Range Status   Specimen Description BLOOD RIGHT ARM  Final   Special Requests   Final    Blood Culture adequate volume BOTTLES DRAWN AEROBIC AND ANAEROBIC   Culture   Final    NO GROWTH < 24 HOURS Performed at [facility], [address] [place], [place] [address]    Report Status PENDING  Incomplete  SARS Coronavirus 2 (CEPHEID - Performed in [place] hospital lab), Hosp Order     Status: None   Collection Time: 01/27/2019 12:17 PM  Result Value Ref Range Status   SARS Coronavirus 2 NEGATIVE NEGATIVE Final    Comment: (NOTE) If result is NEGATIVE SARS-CoV-2 target nucleic acids are NOT DETECTED. The SARS-CoV-2 RNA is generally detectable in upper and lower  respiratory specimens during the acute phase of infection. The lowest  concentration  of SARS-CoV-2 viral copies this assay can detect is 250  copies / mL. A negative result does not preclude SARS-CoV-2 infection  and should not be used as the sole basis for treatment or other  patient management decisions.  A negative result may occur with  improper specimen collection / handling, submission of specimen other  than  nasopharyngeal swab, presence of viral mutation(s) within the  areas targeted by this assay, and inadequate number of viral copies  (<250 copies / mL). A negative result must be combined with clinical  observations, patient history, and epidemiological information. If result is POSITIVE SARS-CoV-2 target nucleic acids are DETECTED. The SARS-CoV-2 RNA is generally detectable in upper and lower  respiratory specimens dur ing the acute phase of infection.  Positive  results are indicative of active infection with SARS-CoV-2.  Clinical  correlation with patient history and other diagnostic information is  necessary to determine patient infection status.  Positive results do  not rule out bacterial infection or co-infection with other viruses. If result is PRESUMPTIVE POSTIVE SARS-CoV-2 nucleic acids MAY BE PRESENT.   A presumptive positive result was obtained on the submitted specimen  and confirmed on repeat testing.  While 2019 novel coronavirus  (SARS-CoV-2) nucleic acids may be present in the submitted sample  additional confirmatory testing may be necessary for epidemiological  and / or clinical management purposes  to differentiate between  SARS-CoV-2 and other Sarbecovirus currently known to infect humans.  If clinically indicated additional testing with an alternate test  methodology [phone]) is advised. The SARS-CoV-2 RNA is generally  detectable in upper and lower respiratory sp ecimens during the acute  phase of infection. The expected result is Negative. Fact Sheet for Patients:  StrictlyIdeas.no Fact Sheet for Healthcare Providers: BankingDealers.co.za This test is not yet approved or cleared by the [place] FDA and has been authorized for detection and/or diagnosis of SARS-CoV-2 by FDA under an Emergency Use Authorization (EUA).  This EUA will remain in effect (meaning this test can be used) for the duration of  the COVID-19 declaration under Section 564(b)(1) of the Act, 21 U.S.C. section 360bbb-3(b)(1), unless the authorization is terminated or revoked sooner. Performed at [facility], [address], [place], [place] [address]   Blood culture (routine x 2)     Status: None (Preliminary result)   Collection Time: 01/27/2019 12:46 PM  Result Value Ref Range Status   Specimen Description LEFT ANTECUBITAL  Final   Special Requests   Final    BOTTLES DRAWN AEROBIC AND ANAEROBIC Blood Culture adequate volume   Culture   Final    NO GROWTH < 24 HOURS Performed at [facility], [address], [place], [place] [address]    Report Status PENDING  Incomplete  MRSA PCR Screening     Status: None   Collection Time: 01/16/2019  7:57 PM  Result Value Ref Range Status   MRSA by PCR NEGATIVE NEGATIVE Final    Comment:        The GeneXpert MRSA Assay (FDA approved for NASAL specimens only), is one component of a comprehensive MRSA colonization surveillance program. It is not intended to diagnose MRSA infection nor to guide or monitor treatment for MRSA infections. Performed at [facility], [address], [place], [place] [address]     RN Pressure Injury Documentation and I am in current agreement with the RN's Assessment Pressure Injury 01/06/2019 Sacrum Right Stage II -  Partial thickness loss of dermis presenting as a shallow open  ulcer with a red, pink wound bed without slough. round, open, red circular wound (Active)  01/13/2019 1700  Location: Sacrum  Location Orientation: Right  Staging: Stage II -  Partial thickness loss of dermis presenting as a shallow open ulcer with a red, pink wound bed without slough.  Wound Description (Comments): round, open, red circular wound  Present on Admission: Yes     Pressure Injury 01/16/2019 Foot Right Stage I -  Intact skin with non-blanchable redness of a localized area usually over a bony prominence. skin intact;discoloration;no drainage (Active)  02/01/2019  1900  Location: Foot  Location Orientation: Right  Staging: Stage I -  Intact skin with non-blanchable redness of a localized area usually over a bony prominence.  Wound Description (Comments): skin intact;discoloration;no drainage  Present on Admission: Yes    Radiology Studies: Ct Chest Wo Contrast  Result Date: 01/05/2019 CLINICAL DATA:  Shortness of breath.  Pleural effusion. EXAM: CT CHEST WITHOUT CONTRAST TECHNIQUE: Multidetector CT imaging of the chest was performed following the standard protocol without IV contrast. COMPARISON:  Chest x-ray from same day. CT chest dated October 14, 2013. FINDINGS: Cardiovascular: Cardiomegaly with severe biatrial enlargement. No pericardial effusion. No thoracic aortic aneurysm. Coronary, aortic arch, and branch vessel atherosclerotic vascular disease. Mediastinum/Nodes: No enlarged mediastinal or axillary lymph nodes. Thyroid gland, trachea, and esophagus demonstrate no significant findings. Lungs/Pleura: Large bilateral pleural effusions with significant atelectasis of both lower lobes. Scattered mild interlobular septal thickening. No consolidation or pneumothorax. No suspicious pulmonary nodule. Upper Abdomen: No acute abnormality. Musculoskeletal: No chest wall mass or suspicious bone lesions identified. IMPRESSION: 1. Congestive heart failure. Large bilateral pleural effusions with significant atelectasis of both lower lobes. Mild interstitial pulmonary edema. 2. Aortic atherosclerosis (ICD10-I70.0). Electronically Signed   By: Titus Dubin M.D.   On: 01/21/2019 16:34   Dg Chest Port 1 View  Result Date: 01/09/2019 CLINICAL DATA:  Shortness of breath. EXAM: PORTABLE CHEST 1 VIEW COMPARISON:  01/16/2019 FINDINGS: Worsening bilateral airspace disease particularly in the left lung. Again noted are moderate sized bilateral pleural effusions. The heart appears to be enlarged. Negative for pneumothorax. IMPRESSION: Increased bilateral airspace disease is  suggestive for pulmonary edema. Persistent bilateral pleural effusions. Electronically Signed   By: Markus Daft M.D.   On: 01/09/2019 09:15   Dg Chest Port 1 View  Result Date: 01/16/2019 CLINICAL DATA:  Shortness of breath with COPD and atrial fibrillation. EXAM: PORTABLE CHEST 1 VIEW COMPARISON:  07/03/2017 FINDINGS: Stable densities in the mid and lower chest are most compatible with bilateral pleural effusions with compressive atelectasis. Cannot exclude mild edema or central vascular congestion. Stable enlargement of the cardiac silhouette. IMPRESSION: Slightly enlarged central vascular structures compared to the previous examination and concerning for mild edema. Persistent bibasilar chest densities are most compatible with moderate sized pleural effusions with compressive atelectasis or associated airspace disease. Probable cardiomegaly. Electronically Signed   By: Markus Daft M.D.   On: 01/30/2019 13:15   Scheduled Meds:  Chlorhexidine Gluconate Cloth  6 each Topical Daily   furosemide  40 mg Intravenous BID   gabapentin  100 mg Oral TID   guaiFENesin  600 mg Oral BID   ipratropium  0.5 mg Nebulization Q6H   levalbuterol  0.63 mg Nebulization Q6H   levothyroxine  125 mcg Oral Q0600   mouth rinse  15 mL Mouth Rinse BID   pravastatin  10 mg Oral q1800   Continuous Infusions:  diltiazem (CARDIZEM) infusion 5 mg/hr (01/09/19 0600)  LOS: 1 day   Kerney Elbe, DO [facility] PAGER is on AMION  If 7PM-7AM, please contact night-coverage [email or web address] Password [email or web address] 01/09/2019, 3:56 PM

## 2019-01-09 NOTE — Consult Note (Signed)
Referring Provider: No ref. provider found Primary Care Physician:  [name], MD Primary Gastroenterologist:  Dr.  Gala Romney  Reason for Consultation: Acute on chronic anemia;  Hemoccult positive stool.    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  Admitted to the hospital yesterday with progressive weakness and dyspnea on exertion. Hemoglobin admission 9.3; down from last value in December 2019 (15.1) Marked macrocytic indices with MCV of 132.7. Occult blood positive on DRE in ED. INR 3.7 on admission. He has remained hemodynamically stable overnight.  Her hemoglobin this morning 8.5 MCV 132.7 platelet count 195K  This lady denies hematochezia or melena; she denies any change in bowel function.  No abdominal pain.  No nausea, vomiting, odynophagia, dysphagia, early satiety or weight loss.  States her appetite is well-maintained. At baseline, can walk no more than a few steps at home with a walker without "giving out". 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   This admission, ferritin 161, iron 34, IBC 220, saturation 15%    Past Medical History:  Diagnosis Date   Anxiety    Arthritis    Atrial fibrillation, chronic    a. on Coumadin for anticoagulation.    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   DVT (deep venous thrombosis) (HCC)    Essential hypertension    GERD (gastroesophageal reflux disease)    HOH (hard of hearing)    Hyperlipidemia    Hypothyroidism    Iron deficiency anemia due to chronic  blood loss 03/18/2018   Open wound    right groin   Oxygen dependent    4 lpm   Peripheral neuropathy    Peripheral vascular disease (HCC)    Polycythemia vera(238.4) 10/01/2011   Ulcer of ankle ([place])    Bilateral 2015   Varicose veins     Past Surgical History:  Procedure Laterality Date   ABDOMINAL AORTAGRAM N/A 09/14/2012   Procedure: ABDOMINAL [name];  Surgeon: Serafina Mitchell, MD;  Location: [facility] CATH LAB;  Service: Cardiovascular;  Laterality: N/A;   ABDOMINAL AORTOGRAM N/A 03/06/2017   Procedure: ABDOMINAL AORTOGRAM;  Surgeon: Elam Dutch, MD;  Location: [place] CV LAB;  Service: Cardiovascular;  Laterality: N/A;   ABDOMINAL AORTOGRAM W/LOWER EXTREMITY N/A 09/21/2017   Procedure: ABDOMINAL AORTOGRAM W/LOWER EXTREMITY;  Surgeon: Waynetta Sandy, MD;  Location: [place] CV LAB;  Service: Cardiovascular;  Laterality: N/A;   ABDOMINAL HYSTERECTOMY     AMPUTATION Right 09/29/2017   Procedure: DEBRIDEMENT TOE AMPUTATION RIGHT FIFTH TOE;  Surgeon: Angelia Mould, MD;  Location: [place];  Service: Vascular;  Laterality: Right;   APPLICATION OF WOUND VAC Right 09/29/2017   Procedure: APPLICATION OF WOUND VAC ON RIGHT FOOT;  Surgeon: Angelia Mould, MD;  Location: [place];  Service: Vascular;  Laterality: Right;   CATARACT EXTRACTION W/PHACO Right 08/31/2017   Procedure: CATARACT EXTRACTION PHACO AND INTRAOCULAR LENS PLACEMENT RIGHT EYE;  Surgeon: Tonny Branch, MD;  [facility]: [facility];  Service: Ophthalmology;  Laterality: Right;  CDE: 13.27  CATARACT EXTRACTION W/PHACO Left 09/14/2017   Procedure: CATARACT EXTRACTION PHACO AND INTRAOCULAR LENS PLACEMENT LEFT EYE;  Surgeon: Tonny Branch, MD;  Location: [facility];  Service: Ophthalmology;  Laterality: Left;  CDE: 13.02   CORONARY ANGIOPLASTY     DRESSING CHANGE UNDER ANESTHESIA  12/02/2011   Procedure: DRESSING CHANGE UNDER ANESTHESIA;  Surgeon: Carole Civil, MD;  Location: [facility];  Service:  Orthopedics;  Laterality: Right;   ENDARTERECTOMY FEMORAL Right 09/15/2012   Procedure: ENDARTERECTOMY FEMORAL;  Surgeon: Mal Misty, MD;  Location: [place];  Service: Vascular;  Laterality: Right;   FASCIOTOMY  11/29/2011   Procedure: FASCIOTOMY;  Surgeon: Carole Civil, MD;  [facility]: [facility];  Service: Orthopedics;  Laterality: Right;  right thigh    FEMORAL-POPLITEAL BYPASS GRAFT Right 09/15/2012   Procedure: BYPASS GRAFT FEMORAL-POPLITEAL ARTERY;  Surgeon: Mal Misty, MD;  Location: [place];  Service: Vascular;  Laterality: Right;   FEMORAL-TIBIAL BYPASS GRAFT Right 03/11/2017   Procedure: RIGHT FEMORAL-TIBIAL BYPASS IN-SITU GREATER SAPHENOUS VEIN;  Surgeon: Waynetta Sandy, MD;  Location: [place];  Service: Vascular;  Laterality: Right;   GROIN DEBRIDEMENT Right 12/10/2017   Procedure: GROIN DEBRIDEMENT EXPLORATION;  Surgeon: Waynetta Sandy, MD;  Location: [place];  Service: Vascular;  Laterality: Right;   HIP PINNING,CANNULATED  11/19/2011   Procedure: CANNULATED HIP PINNING;  Surgeon: Sanjuana Kava, MD;  [facility]: [facility];  Service: Orthopedics;  Laterality: Right;   LOWER EXTREMITY ANGIOGRAPHY Bilateral 03/06/2017   Procedure: Lower Extremity Angiography;  Surgeon: Elam Dutch, MD;  Location: [place] CV LAB;  Service: Cardiovascular;  Laterality: Bilateral;   PATCH ANGIOPLASTY Right 09/15/2012   Procedure: PATCH ANGIOPLASTY;  Surgeon: Mal Misty, MD;  Location: [place];  Service: Vascular;  Laterality: Right;   TUBAL LIGATION      Prior to Admission medications   Medication Sig Start Date End Date Taking? Authorizing Provider  acetaminophen (PAIN RELIEVER) 500 MG tablet Take 1,000 mg by mouth every 6 (six) hours as needed for mild pain or moderate pain.   Yes [provider]  aspirin EC 81 MG EC tablet Take 1 tablet (81 mg total) by mouth daily. 06/02/17  Yes Johnson, Clanford L, MD  CARTIA XT 240 MG 24 hr capsule TAKE 1 CAPSULE BY MOUTH  ONCE DAILY Patient taking differently: Take 240 mg by mouth every morning.  04/06/18  Yes Herminio Commons, MD  gabapentin (NEURONTIN) 300 MG capsule Take 300 mg by mouth 3 (three) times daily. 12/07/18  Yes [provider]  hydroxyurea (HYDREA) 500 MG capsule Take 2 capsules (1,000 mg total) by mouth daily. May take with food to minimize GI side effects. Patient taking differently: Take 1,000 mg by mouth every morning. May take with food to minimize GI side effects. 07/14/18  Yes Higgs, Mathis Dad, MD  levothyroxine (SYNTHROID) 112 MCG tablet Take 112 mcg by mouth every morning. 12/23/18  Yes [provider]  pravastatin (PRAVACHOL) 20 MG tablet Take 10 mg by mouth at bedtime.  08/08/17  Yes [provider]  torsemide (DEMADEX) 20 MG tablet TAKE 1 TABLET BY MOUTH ONCE DAILY AS NEEDED **MAY  TAKE  FOR  WEIGHT  GAIN  OF  3  LBS  OVER  24  HOURS** Patient taking differently: Take 20 mg by mouth daily as needed (for weight gain over 3 pounds).  10/25/18  Yes Herminio Commons, MD  warfarin (COUMADIN) 5 MG tablet Take 1 tablet daily except 1/2 tablet on  Sundays, Tuesdays and Thursdays or as directed Patient taking differently: Take 2.5-5 mg by mouth See admin instructions. Take 1 tablet (5mg  total) daily except 1/2 tablet (2.5mg  total) on Sundays, Tuesdays and Thursdays or as directed. 11/23/18  Yes Herminio Commons, MD    Current Facility-Administered Medications  Medication Dose Route Frequency Provider Last Rate Last Dose   acetaminophen (TYLENOL) tablet 650 mg  650 mg Oral Q6H PRN Raiford Noble Latif, DO       Or   acetaminophen (TYLENOL) suppository 650 mg  650 mg Rectal Q6H PRN Alfredia Ferguson, Omair Latif, DO       bisacodyl (DULCOLAX) suppository 10 mg  10 mg Rectal Daily PRN Sheikh, Omair Latif, DO       Chlorhexidine Gluconate Cloth 2 % PADS 6 each  6 each Topical Daily Sheikh, Omair Latif, DO       diltiazem (CARDIZEM) 100 mg in dextrose 5 % 100 mL (1 mg/mL) infusion  5  mg/hr Intravenous Continuous Sheikh, Omair Latif, DO 5 mL/hr at 01/09/19 0600 5 mg/hr at 01/09/19 0600   furosemide (LASIX) injection 40 mg  40 mg Intravenous BID Raiford Noble [place], DO   40 mg at 02/01/2019 1858   gabapentin (NEURONTIN) capsule 100 mg  100 mg Oral TID Raiford Noble [place], DO   100 mg at 01/06/2019 2211   guaiFENesin (MUCINEX) 12 hr tablet 600 mg  600 mg Oral BID Raiford Noble [place], DO   600 mg at 01/29/2019 2211   ipratropium (ATROVENT) nebulizer solution 0.5 mg  0.5 mg Nebulization Q6H Sheikh, Georgina Quint Latif, DO   0.5 mg at 01/09/19 0409   levalbuterol (XOPENEX) nebulizer solution 0.63 mg  0.63 mg Nebulization Q6H Raiford Noble [place], DO   0.63 mg at 01/09/19 8185   levothyroxine (SYNTHROID) tablet 125 mcg  125 mcg Oral Q0600 Raiford Noble [place], [place]   125 mcg at 01/09/19 6314   MEDLINE mouth rinse  15 mL Mouth Rinse BID Raiford Noble Latif, DO       ondansetron [facility]) tablet 4 mg  4 mg Oral Q6H PRN Raiford Noble Latif, DO       Or   ondansetron [facility]) injection 4 mg  4 mg Intravenous Q6H PRN Sheikh, Georgina Quint Latif, DO       pravastatin (PRAVACHOL) tablet 10 mg  10 mg Oral q1800 Raiford Noble [place], DO   10 mg at 01/24/2019 1858   senna-docusate (Senokot-S) tablet 1 tablet  1 tablet Oral QHS PRN Raiford Noble Latif, DO       traMADol Veatrice [place]) tablet 50 mg  50 mg Oral Q6H PRN Raiford Noble Latif, DO   50 mg at 01/23/2019 2211    Allergies as of 01/15/2019   (No Known Allergies)    Family History  Problem Relation Age of Onset   Heart failure Mother    Heart disease Mother    Stroke Father    Heart disease Sister    Diabetes Son    Hypertension Sister    Hypertension Sister    Stroke Sister     Social History   Socioeconomic History   Marital status: Married    Spouse name: Not on file   Number of children: Not on file   Years of education: Not on file   Highest education level: Not on file  Occupational History   Not on file  Social Needs    Financial resource strain: Not on file   Food insecurity:    Worry: Not on  file    Inability: Not on file   Transportation needs:    Medical: Not on file    Non-medical: Not on file  Tobacco Use   Smoking status: Never Smoker   Smokeless tobacco: Never Used  Substance and Sexual Activity   Alcohol use: No    Alcohol/week: 0.0 standard drinks   Drug use: No   Sexual activity: Not Currently  Lifestyle   Physical activity:    Days per week: Not on file    Minutes per session: Not on file   Stress: Not on file  Relationships   Social connections:    Talks on phone: Not on file    Gets together: Not on file    Attends religious service: Not on file    Active member of club or organization: Not on file    Attends meetings of clubs or organizations: Not on file    Relationship status: Not on file   Intimate partner violence:    Fear of current or ex partner: Not on file    Emotionally abused: Not on file    Physically abused: Not on file    Forced sexual activity: Not on file  Other Topics Concern   Not on file  Social History Narrative   Not on file    Review of Systems: As in history of present illness  Physical Exam: Vital signs in last 24 hours: Temp:  [97.4 F (36.3 C)-99 F (37.2 C)] 98.3 F (36.8 C) (06/07 0739) Pulse Rate:  [31-143] 116 (06/07 0739) Resp:  [20-38] 26 (06/07 0739) BP: (92-123)/(54-82) 92/58 (06/07 0645) SpO2:  [86 %-100 %] 99 % (06/07 0739) Weight:  [70.8 kg-73.7 kg] 73.7 kg (06/07 0545) Last BM Date: 01/28/2019 General:   Frail elderly lady.  Awake conversant ; quite hard of hearing.   Neck:  Supple; no masses or thyromegaly. Lungs: Poor air movement distant breath sounds.   Heart:  Regular rate and rhythm; no murmurs, clicks, rubs,  or gallops. Abdomen:  Soft, nontender and nondistended. No masses, hepatosplenomegaly or hernias noted. Normal bowel sounds, without guarding, and without rebound.   Extremities: Venous stasis  changes; status post amputation right small toe.  Intake/Output from previous day: 06/06 0701 - 06/07 0700 In: 777 [P.O.:720; I.V.:57] Out: 750 [Urine:750] Intake/Output this shift: No intake/output data recorded.  Lab Results: Recent Labs    01/23/2019 1200 01/16/2019 1426 01/09/19 0331  WBC 6.0  --  5.9  HGB 9.3*  --  8.5*  HCT 29.4*  --  27.2*  PLT 248 200 195   BMET Recent Labs    01/16/2019 1200 01/09/19 0331  NA 138 136  K 4.5 4.2  CL 105 108  CO2 20* 21*  GLUCOSE 106* 109*  BUN 53* 53*  CREATININE 1.76* 1.63*  CALCIUM 8.6* 8.3*   LFT Recent Labs    01/07/2019 1426 01/09/19 0331  PROT 6.6 6.0*  ALBUMIN 3.6 3.3*  AST 14* 13*  ALT 7 9  ALKPHOS 72 65  BILITOT 1.2 1.6*  BILIDIR 0.4*  --   IBILI 0.8  --    PT/INR Recent Labs    01/07/2019 1426 01/09/19 0331  LABPROT 35.7* 36.5*  INR 3.7* 3.8*   Hepatitis Panel No results for input(s): HEPBSAG, HCVAB, HEPAIGM, HEPBIGM in the last 72 hours. C-Diff No results for input(s): CDIFFTOX in the last 72 hours.  Studies/Results: Ct Chest Wo Contrast  Result Date: 01/24/2019 CLINICAL DATA:  Shortness of breath.  Pleural effusion.  EXAM: CT CHEST WITHOUT CONTRAST TECHNIQUE: Multidetector CT imaging of the chest was performed following the standard protocol without IV contrast. COMPARISON:  Chest x-ray from same day. CT chest dated October 14, 2013. FINDINGS: Cardiovascular: Cardiomegaly with severe biatrial enlargement. No pericardial effusion. No thoracic aortic aneurysm. Coronary, aortic arch, and branch vessel atherosclerotic vascular disease. Mediastinum/Nodes: No enlarged mediastinal or axillary lymph nodes. Thyroid gland, trachea, and esophagus demonstrate no significant findings. Lungs/Pleura: Large bilateral pleural effusions with significant atelectasis of both lower lobes. Scattered mild interlobular septal thickening. No consolidation or pneumothorax. No suspicious pulmonary nodule. Upper Abdomen: No acute  abnormality. Musculoskeletal: No chest wall mass or suspicious bone lesions identified. IMPRESSION: 1. Congestive heart failure. Large bilateral pleural effusions with significant atelectasis of both lower lobes. Mild interstitial pulmonary edema. 2. Aortic atherosclerosis (ICD10-I70.0). Electronically Signed   By: Titus Dubin M.D.   On: 01/10/2019 16:34   Dg Chest Port 1 View  Result Date: 01/06/2019 CLINICAL DATA:  Shortness of breath with COPD and atrial fibrillation. EXAM: PORTABLE CHEST 1 VIEW COMPARISON:  07/03/2017 FINDINGS: Stable densities in the mid and lower chest are most compatible with bilateral pleural effusions with compressive atelectasis. Cannot exclude mild edema or central vascular congestion. Stable enlargement of the cardiac silhouette. IMPRESSION: Slightly enlarged central vascular structures compared to the previous examination and concerning for mild edema. Persistent bibasilar chest densities are most compatible with moderate sized pleural effusions with compressive atelectasis or associated airspace disease. Probable cardiomegaly. Electronically Signed   By: Markus Daft M.D.   On: 01/14/2019 13:15   Impression: Debilitated 78 year old lady with multiple comorbidities admitted to the hospital with acute on chronic respiratory failure, CHF, acute on chronic anemia, occult blood positive stool in the setting of coagulopathy.  Fortunately, she is otherwise essentially devoid of any GI symptoms.  Hemoccult positive stool in the setting of coagulopathy and antiplatelet therapy (aspirin) could be due to any number of etiologies throughout the GI tract.  I do not see this lady ever being a candidate for any endoscopic evaluation given her comorbidities.  As a separate issue, I note mildly elevated total bilirubin.  Goes back a couple of years without prior fractionization. This is also nonspecific.  Venous congestion can produce this finding.  Medications are always a possibility  but less likely a significant contribution given minimal elevation. An element of Gilberts syndrome could also be in the mix but fractionated values not known in the past.  Her minimal elevation of bilirubin is nonspecific and not very impressive.   Recommendations:  Follow clinically. If Coumadin is to be continued, need tighter control of INR. If aspirin is to be continued, would consider daily acid suppression therapy for cytoprotective effect. Would not evaluate bilirubin further unless it rises above the 3-4 range and/or transaminases bump.         Notice:  This dictation was prepared with Dragon dictation along with smaller phrase technology. Any transcriptional errors that result from this process are unintentional and may not be corrected upon review.

## 2019-01-10 ENCOUNTER — Inpatient Hospital Stay (HOSPITAL_COMMUNITY): Payer: Medicare HMO

## 2019-01-10 ENCOUNTER — Other Ambulatory Visit (HOSPITAL_COMMUNITY): Payer: Medicare HMO

## 2019-01-10 DIAGNOSIS — Z8249 Family history of ischemic heart disease and other diseases of the circulatory system: Secondary | ICD-10-CM

## 2019-01-10 DIAGNOSIS — J9621 Acute and chronic respiratory failure with hypoxia: Secondary | ICD-10-CM

## 2019-01-10 DIAGNOSIS — I4891 Unspecified atrial fibrillation: Secondary | ICD-10-CM

## 2019-01-10 DIAGNOSIS — Z9861 Coronary angioplasty status: Secondary | ICD-10-CM

## 2019-01-10 DIAGNOSIS — I482 Chronic atrial fibrillation, unspecified: Secondary | ICD-10-CM

## 2019-01-10 DIAGNOSIS — Z7901 Long term (current) use of anticoagulants: Secondary | ICD-10-CM

## 2019-01-10 DIAGNOSIS — J449 Chronic obstructive pulmonary disease, unspecified: Secondary | ICD-10-CM

## 2019-01-10 DIAGNOSIS — Z95820 Peripheral vascular angioplasty status with implants and grafts: Secondary | ICD-10-CM

## 2019-01-10 DIAGNOSIS — D539 Nutritional anemia, unspecified: Secondary | ICD-10-CM

## 2019-01-10 DIAGNOSIS — D500 Iron deficiency anemia secondary to blood loss (chronic): Secondary | ICD-10-CM

## 2019-01-10 DIAGNOSIS — I739 Peripheral vascular disease, unspecified: Secondary | ICD-10-CM

## 2019-01-10 DIAGNOSIS — D45 Polycythemia vera: Secondary | ICD-10-CM

## 2019-01-10 DIAGNOSIS — Z9981 Dependence on supplemental oxygen: Secondary | ICD-10-CM

## 2019-01-10 DIAGNOSIS — J95811 Postprocedural pneumothorax: Secondary | ICD-10-CM

## 2019-01-10 DIAGNOSIS — Z89421 Acquired absence of other right toe(s): Secondary | ICD-10-CM

## 2019-01-10 DIAGNOSIS — I5030 Unspecified diastolic (congestive) heart failure: Secondary | ICD-10-CM

## 2019-01-10 LAB — BODY FLUID CELL COUNT WITH DIFFERENTIAL
Eos, Fluid: 0 %
Lymphs, Fluid: 2 %
Monocyte-Macrophage-Serous Fluid: 7 % — ABNORMAL LOW (ref 50–90)
Neutrophil Count, Fluid: 91 % — ABNORMAL HIGH (ref 0–25)
Other Cells, Fluid: 1 %
Total Nucleated Cell Count, Fluid: 134 cu mm (ref 0–1000)

## 2019-01-10 LAB — GLUCOSE, CAPILLARY
Glucose-Capillary: 90 mg/dL (ref 70–99)
Glucose-Capillary: 75 mg/dL (ref 70–99)

## 2019-01-10 LAB — BLOOD GAS, ARTERIAL
Acid-base deficit: 3.5 mmol/L — ABNORMAL HIGH (ref 0.0–2.0)
Bicarbonate: 21.6 mmol/L (ref 20.0–28.0)
FIO2: 48
O2 Saturation: 91.4 %
Patient temperature: 36.6
pCO2 arterial: 30.9 mmHg — ABNORMAL LOW (ref 32.0–48.0)
pH, Arterial: 7.428 (ref 7.350–7.450)
pO2, Arterial: 61.1 mmHg — ABNORMAL LOW (ref 83.0–108.0)

## 2019-01-10 LAB — COMPREHENSIVE METABOLIC PANEL
ALT: 7 U/L (ref 0–44)
AST: 12 U/L — ABNORMAL LOW (ref 15–41)
Albumin: 3.1 g/dL — ABNORMAL LOW (ref 3.5–5.0)
Alkaline Phosphatase: 60 U/L (ref 38–126)
Anion gap: 9 (ref 5–15)
BUN: 55 mg/dL — ABNORMAL HIGH (ref 8–23)
CO2: 21 mmol/L — ABNORMAL LOW (ref 22–32)
Calcium: 8.4 mg/dL — ABNORMAL LOW (ref 8.9–10.3)
Chloride: 105 mmol/L (ref 98–111)
Creatinine, Ser: 1.65 mg/dL — ABNORMAL HIGH (ref 0.44–1.00)
GFR calc Af Amer: 35 mL/min — ABNORMAL LOW (ref >60)
GFR calc non Af Amer: 30 mL/min — ABNORMAL LOW (ref >60)
Glucose, Bld: 95 mg/dL (ref 70–99)
Potassium: 3.8 mmol/L (ref 3.5–5.1)
Sodium: 135 mmol/L (ref 135–145)
Total Bilirubin: 1.5 mg/dL — ABNORMAL HIGH (ref 0.3–1.2)
Total Protein: 5.8 g/dL — ABNORMAL LOW (ref 6.5–8.1)

## 2019-01-10 LAB — PROTEIN, PLEURAL OR PERITONEAL FLUID: Total protein, fluid: <3 g/dL

## 2019-01-10 LAB — CBC WITH DIFFERENTIAL/PLATELET
Abs Immature Granulocytes: 0.13 10*3/uL — ABNORMAL HIGH (ref 0.00–0.07)
Basophils Absolute: 0.1 10*3/uL (ref 0.0–0.1)
Basophils Relative: 2 %
Eosinophils Absolute: 0.1 10*3/uL (ref 0.0–0.5)
Eosinophils Relative: 2 %
HCT: 27.9 % — ABNORMAL LOW (ref 36.0–46.0)
Hemoglobin: 8.5 g/dL — ABNORMAL LOW (ref 12.0–15.0)
Immature Granulocytes: 2 %
Lymphocytes Relative: 10 %
Lymphs Abs: 0.6 10*3/uL — ABNORMAL LOW (ref 0.7–4.0)
MCH: 40.9 pg — ABNORMAL HIGH (ref 26.0–34.0)
MCHC: 30.5 g/dL (ref 30.0–36.0)
MCV: 134.1 fL — ABNORMAL HIGH (ref 80.0–100.0)
Monocytes Absolute: 0.3 10*3/uL (ref 0.1–1.0)
Monocytes Relative: 5 %
Neutro Abs: 4.3 10*3/uL (ref 1.7–7.7)
Neutrophils Relative %: 79 %
Platelets: 207 10*3/uL (ref 150–400)
RBC: 2.08 MIL/uL — ABNORMAL LOW (ref 3.87–5.11)
RDW: 20.6 % — ABNORMAL HIGH (ref 11.5–15.5)
WBC: 5.4 10*3/uL (ref 4.0–10.5)
nRBC: 1.1 % — ABNORMAL HIGH (ref 0.0–0.2)

## 2019-01-10 LAB — GRAM STAIN

## 2019-01-10 LAB — GLUCOSE, PLEURAL OR PERITONEAL FLUID: Glucose, Fluid: 93 mg/dL

## 2019-01-10 LAB — MAGNESIUM: Magnesium: 2.1 mg/dL (ref 1.7–2.4)

## 2019-01-10 LAB — PROTIME-INR
INR: 3.1 — ABNORMAL HIGH (ref 0.8–1.2)
Prothrombin Time: 31.3 seconds — ABNORMAL HIGH (ref 11.4–15.2)

## 2019-01-10 LAB — PHOSPHORUS: Phosphorus: 4.1 mg/dL (ref 2.5–4.6)

## 2019-01-10 LAB — ALBUMIN, PLEURAL OR PERITONEAL FLUID: Albumin, Fluid: 1.7 g/dL

## 2019-01-10 LAB — LACTATE DEHYDROGENASE, PLEURAL OR PERITONEAL FLUID: LD, Fluid: 185 U/L — ABNORMAL HIGH (ref 3–23)

## 2019-01-10 MED ORDER — IPRATROPIUM BROMIDE 0.02 % IN SOLN
0.5000 mg | Freq: Three times a day (TID) | RESPIRATORY_TRACT | Status: DC
  Administered 2019-01-11 – 2019-01-16 (×16): 0.5 mg via RESPIRATORY_TRACT
  Filled 2019-01-10 (×16): qty 2.5

## 2019-01-10 MED ORDER — AMIODARONE HCL IN DEXTROSE 360-4.14 MG/200ML-% IV SOLN
30.0000 mg/h | INTRAVENOUS | Status: DC
  Administered 2019-01-10 – 2019-01-16 (×11): 30 mg/h via INTRAVENOUS
  Filled 2019-01-10 (×12): qty 200

## 2019-01-10 MED ORDER — AMIODARONE HCL IN DEXTROSE 360-4.14 MG/200ML-% IV SOLN
60.0000 mg/h | INTRAVENOUS | Status: AC
  Administered 2019-01-10: 60 mg/h via INTRAVENOUS
  Filled 2019-01-10: qty 200

## 2019-01-10 MED ORDER — LEVALBUTEROL HCL 0.63 MG/3ML IN NEBU
0.6300 mg | INHALATION_SOLUTION | Freq: Three times a day (TID) | RESPIRATORY_TRACT | Status: DC
  Administered 2019-01-11 – 2019-01-16 (×16): 0.63 mg via RESPIRATORY_TRACT
  Filled 2019-01-10 (×16): qty 3

## 2019-01-10 MED ORDER — FUROSEMIDE 10 MG/ML IJ SOLN
60.0000 mg | Freq: Two times a day (BID) | INTRAMUSCULAR | Status: DC
  Administered 2019-01-10 – 2019-01-11 (×2): 60 mg via INTRAVENOUS
  Filled 2019-01-10 (×2): qty 6

## 2019-01-10 NOTE — Plan of Care (Signed)
  Problem: Acute Rehab OT Goals (only OT should resolve) Goal: Pt. Will Perform Upper Body Bathing Flowsheets (Taken 01/10/2019 1326) Pt Will Perform Upper Body Bathing: with modified independence; sitting Goal: Pt. Will Perform Lower Body Bathing Flowsheets (Taken 01/10/2019 1326) Pt Will Perform Lower Body Bathing: with min assist; sit to/from stand; sitting/lateral leans Goal: Pt. Will Perform Lower Body Dressing Flowsheets (Taken 01/10/2019 1326) Pt Will Perform Lower Body Dressing: with min assist; sit to/from stand; sitting/lateral leans Goal: Pt. Will Transfer To Toilet Flowsheets (Taken 01/10/2019 1326) Pt Will Transfer to Toilet: with supervision; ambulating; bedside commode; regular height toilet Goal: Pt. Will Perform Toileting-Clothing Manipulation Flowsheets (Taken 01/10/2019 1326) Pt Will Perform Toileting - Clothing Manipulation and hygiene: with supervision; sitting/lateral leans; sit to/from stand Goal: OT Additional ADL Goal #1 Flowsheets (Taken 01/10/2019 1326) Additional ADL Goal #1: patient will increase standing activity tolerance while completing a ADL task with supervision and no change in vital signs.

## 2019-01-10 NOTE — Procedures (Signed)
PreOperative Dx: LEFT pleural effusion Postoperative Dx: LEFT pleural effusion Procedure:   US guided LEFT thoracentesis Radiologist:  Thornton Papas Anesthesia:  10 ml of 1% lidocaine Specimen:  1.2 L of yellow colored fluid EBL:   < 1 ml Complications: None

## 2019-01-10 NOTE — Consult Note (Signed)
Cardiology Consultation:   Patient ID: JALAN BODI; [id number]; June 04, 1941   Admit date: 01/05/2019 Date of Consult: 01/10/2019  Primary Care Provider: [name], MD Primary Cardiologist: Kate Sable, MD   Patient Profile:   Charlotte [name] is a 78 y.o. female with a history of permanent atrial fibrillation, chronic diastolic heart failure, PAD, hypertension, polycythemia vera, and COPD with chronic hypoxic respiratory failure who is being seen today for the evaluation of atrial fibrillation at the request of Dr. Alfredia Ferguson.  History of Present Illness:   Ms. Charlotte [name] presents with worsening shortness of breath and weakness, no fevers or chills and found to be SARS coronavirus 2 negative.  She was initially treated for rapid atrial fibrillation and COPD exacerbation as well as symptomatic anemia likely due to GI bleed in the setting of supratherapeutic INR.  She had acute renal insufficiency as well and was hydrated with baseline diuretics put on hold.  She has had a subsequent positive fluid balance and findings of bilateral pleural effusions by chest x-ray.  She is currently on IV diltiazem infusion for management of atrial fibrillation, and remains with RVR.  We are consulted to assist with her management.  She last saw Dr. Bronson Ing back in January.  Weight at that time was 153 pounds.  Current weight is 162 pounds.  Echocardiogram done yesterday revealed LVEF 60 to 65% with severe right ventricular dysfunction and moderate pulmonary hypertension.  Chest x-ray today shows moderate bilateral pleural effusions.  Coumadin has been held with INR down to 3.1.  Past Medical History:  Diagnosis Date  . Anxiety   . Arthritis   . Atrial fibrillation, chronic    a. on Coumadin for anticoagulation.   . Cellulitis 06/29/2017   Right leg  . Chronic hypoxemic respiratory failure (HCC)    4 lpm  . COPD (chronic obstructive pulmonary disease) ([place])   . Dry gangrene ([place])    R  foot  . DVT (deep venous thrombosis) ([place])   . Essential hypertension   . GERD (gastroesophageal reflux disease)   . HOH (hard of hearing)   . Hyperlipidemia   . Hypothyroidism   . Iron deficiency anemia due to chronic blood loss 03/18/2018  . Open wound    Right groin  . Peripheral neuropathy   . Peripheral vascular disease ([place])   . 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 Ulcer of ankle ([place])    Bilateral 2015  . Varicose veins     Past Surgical History:  Procedure Laterality Date  . ABDOMINAL AORTAGRAM N/A 09/14/2012   Procedure: ABDOMINAL [name];  Surgeon: Serafina Mitchell, MD;  Location[facility] CATH LAB;  Service: Cardiovascular;  Laterality: N/A;  . ABDOMINAL AORTOGRAM N/A 03/06/2017   Procedure: ABDOMINAL AORTOGRAM;  Surgeon: Elam Dutch, MD;  Location: [place] CV LAB;  Service: Cardiovascular;  Laterality: N/A;  . ABDOMINAL AORTOGRAM W/LOWER EXTREMITY N/A 09/21/2017   Procedure: ABDOMINAL AORTOGRAM W/LOWER EXTREMITY;  Surgeon: Waynetta Sandy, MD;  Location: [place] CV LAB;  Service: Cardiovascular;  Laterality: N/A;  . ABDOMINAL HYSTERECTOMY    . AMPUTATION Right 09/29/2017   Procedure: DEBRIDEMENT TOE AMPUTATION RIGHT FIFTH TOE;  Surgeon: Angelia Mould, MD;  Location: [place];  Service: Vascular;  Laterality: Right;  . APPLICATION OF WOUND VAC Right 09/29/2017   Procedure: APPLICATION OF WOUND VAC ON RIGHT FOOT;  Surgeon: Angelia Mould, MD;  Location: [place];  Service: Vascular;  Laterality: Right;  . CATARACT EXTRACTION W/PHACO Right 08/31/2017  Procedure: CATARACT EXTRACTION PHACO AND INTRAOCULAR LENS PLACEMENT RIGHT EYE;  Surgeon: Tonny Branch, MD;  Location: [facility];  Service: Ophthalmology;  Laterality: Right;  CDE: 13.27  . CATARACT EXTRACTION W/PHACO Left 09/14/2017   Procedure: CATARACT EXTRACTION PHACO AND INTRAOCULAR LENS PLACEMENT LEFT EYE;  Surgeon: Tonny Branch, MD;  Location: [facility];  Service: Ophthalmology;  Laterality: Left;  CDE:  13.02  . CORONARY ANGIOPLASTY    . DRESSING CHANGE UNDER ANESTHESIA  12/02/2011   Procedure: DRESSING CHANGE UNDER ANESTHESIA;  Surgeon: Carole Civil, MD;  Location: [facility];  Service: Orthopedics;  Laterality: Right;  . ENDARTERECTOMY FEMORAL Right 09/15/2012   Procedure: ENDARTERECTOMY FEMORAL;  Surgeon: Mal Misty, MD;  Location: [place];  Service: Vascular;  Laterality: Right;  . FASCIOTOMY  11/29/2011   Procedure: FASCIOTOMY;  Surgeon: Carole Civil, MD;  Location: [facility];  Service: Orthopedics;  Laterality: Right;  right thigh   . FEMORAL-POPLITEAL BYPASS GRAFT Right 09/15/2012   Procedure: BYPASS GRAFT FEMORAL-POPLITEAL ARTERY;  Surgeon: Mal Misty, MD;  Location: [place];  Service: Vascular;  Laterality: Right;  . FEMORAL-TIBIAL BYPASS GRAFT Right 03/11/2017   Procedure: RIGHT FEMORAL-TIBIAL BYPASS IN-SITU GREATER SAPHENOUS VEIN;  Surgeon: Waynetta Sandy, MD;  Location: [place];  Service: Vascular;  Laterality: Right;  . GROIN DEBRIDEMENT Right 12/10/2017   Procedure: GROIN DEBRIDEMENT EXPLORATION;  Surgeon: Waynetta Sandy, MD;  Location: [place];  Service: Vascular;  Laterality: Right;  . HIP PINNING,CANNULATED  11/19/2011   Procedure: CANNULATED HIP PINNING;  Surgeon: Sanjuana Kava, MD;  Location: [facility];  Service: Orthopedics;  Laterality: Right;  . LOWER EXTREMITY ANGIOGRAPHY Bilateral 03/06/2017   Procedure: Lower Extremity Angiography;  Surgeon: Elam Dutch, MD;  Location: [place] CV LAB;  Service: Cardiovascular;  Laterality: Bilateral;  . PATCH ANGIOPLASTY Right 09/15/2012   Procedure: PATCH ANGIOPLASTY;  Surgeon: Mal Misty, MD;  Location: [place];  Service: Vascular;  Laterality: Right;  . TUBAL LIGATION       Inpatient Medications: Scheduled Meds: . Chlorhexidine Gluconate Cloth  6 each Topical Daily  . furosemide  60 mg Intravenous BID  . gabapentin  100 mg Oral TID  . guaiFENesin  600 mg Oral BID  . ipratropium  0.5 mg Nebulization Q6H   . levalbuterol  0.63 mg Nebulization Q6H  . levothyroxine  125 mcg Oral Q0600  . mouth rinse  15 mL Mouth Rinse BID  . pantoprazole  40 mg Oral Daily  . pravastatin  10 mg Oral q1800   Continuous Infusions: . diltiazem (CARDIZEM) infusion 5 mg/hr (01/10/19 0443)   PRN Meds: acetaminophen **OR** acetaminophen, bisacodyl, ondansetron **OR** ondansetron (ZOFRAN) IV, senna-docusate, traMADol  Allergies:   No Known Allergies  Social History:   Social History   Socioeconomic History  . Marital status: Married    Spouse name: Not on file  . Number of children: Not on file  . Years of education: Not on file  . Highest education level: Not on file  Occupational History  . Not on file  Social Needs  . Financial resource strain: Not on file  . Food insecurity:    Worry: Not on file    Inability: Not on file  . Transportation needs:    Medical: Not on file    Non-medical: Not on file  Tobacco Use  . Smoking status: Never Smoker  . Smokeless tobacco: Never Used  Substance and Sexual Activity  . Alcohol use: No    Alcohol/week: 0.0  standard drinks  . Drug use: No  . Sexual activity: Not Currently  Lifestyle  . Physical activity:    Days per week: Not on file    Minutes per session: Not on file  . Stress: Not on file  Relationships  . Social connections:    Talks on phone: Not on file    Gets together: Not on file    Attends religious service: Not on file    Active member of club or organization: Not on file    Attends meetings of clubs or organizations: Not on file    Relationship status: Not on file  . Intimate partner violence:    Fear of current or ex partner: Not on file    Emotionally abused: Not on file    Physically abused: Not on file    Forced sexual activity: Not on file  Other Topics Concern  . Not on file  Social History Narrative  . Not on file    Family History:   The patient's family history includes Diabetes in her son; Heart disease in her mother  and sister; Heart failure in her mother; Hypertension in her sister and sister; Stroke in her father and sister.  ROS:  Please see the history of present illness.  Bilateral hearing loss.  All other ROS reviewed and negative.     Physical Exam/Data:   Vitals:   01/10/19 0700 01/10/19 0753 01/10/19 0800 01/10/19 0900  BP: 106/64  98/67 92/65  Pulse: (!) 55  (!) 103 (!) 43  Resp: 20  (!) 26 (!) 26  Temp:  98.2 F (36.8 C)    TempSrc:  Oral    SpO2: 95%  (!) 82% 100%  Weight:      Height:        Intake/Output Summary (Last 24 hours) at 01/10/2019 0906 Last data filed at 01/10/2019 0500 Gross per 24 hour  Intake 1191.87 ml  Output 200 ml  Net 991.87 ml   Filed Weights   01/22/2019 1730 01/09/19 0545 01/10/19 0500  Weight: 71.7 kg 73.7 kg 73.7 kg   Body mass index is 22.04 kg/m.   Gen: Elderly woman, no distress. HEENT: Conjunctiva and lids normal, oropharynx clear. Neck: Supple, no elevated JVP or carotid bruits, no thyromegaly. Lungs: Decreased breath sounds and egophony with findings consistent with effusions to the midlung zones bilaterally.. Cardiac: Rapid, irregularly irregular, no gallop or pericardial rub. Abdomen: Soft, nontender, bowel sounds present. Extremities: No pitting edema, SCDs in place, distal pulses 1-2+. Skin: Warm and dry. Musculoskeletal: No kyphosis. Neuropsychiatric: Alert and oriented x3, affect grossly appropriate.  EKG:  I personally reviewed the tracing from 01/03/2019 which shows atrial fibrillation with low voltage in the limb leads, diffuse nonspecific ST-T wave changes, aberrantly conducted complexes.   Telemetry:  I personally reviewed telemetry which shows atrial fibrillation.  Relevant CV Studies:  Echocardiogram 01/09/2019:  1. The left ventricle has normal systolic function with an ejection fraction of 60-65%. The cavity size was normal. Left ventricular diastolic function could not be evaluated secondary to atrial fibrillation.  2. The  right ventricle has severely reduced systolic function. The cavity was moderately enlarged. There is no increase in right ventricular wall thickness. Right ventricular systolic pressure is moderately elevated with an estimated pressure of 49.1  mmHg.  3. Left atrial size was severely dilated.  4. Right atrial size was severely dilated.  5. Large pleural effusion.  6. Trivial pericardial effusion is present.  7. No evidence of mitral  valve stenosis.  8. No stenosis of the aortic valve.  9. The aortic root and ascending aorta are normal in size and structure. 10. The inferior vena cava was normal in size with <50% respiratory variability. 11. The interatrial septum was not assessed.  Laboratory Data:  Chemistry Recent Labs  Lab 01/19/2019 1200 01/09/19 0331 01/09/19 1933  NA 138 136 136  K 4.5 4.2 3.9  CL 105 108 104  CO2 20* 21* 20*  GLUCOSE 106* 109* 97  BUN 53* 53* 54*  CREATININE 1.76* 1.63* 1.66*  CALCIUM 8.6* 8.3* 8.4*  GFRNONAA 28* 30* 30*  GFRAA 32* 35* 34*  ANIONGAP 13 7 12     Recent Labs  Lab 01/18/2019 1426 01/09/19 0331 01/09/19 1933  PROT 6.6 6.0* 6.3*  ALBUMIN 3.6 3.3* 3.4*  AST 14* 13* 13*  ALT 7 9 9   ALKPHOS 72 65 64  BILITOT 1.2 1.6* 1.4*   Hematology Recent Labs  Lab 01/11/2019 1200 01/24/2019 1426 01/09/19 0331 01/10/19 0515  WBC 6.0  --  5.9 5.4  RBC 2.19* 2.22* 2.05* 2.08*  HGB 9.3*  --  8.5* 8.5*  HCT 29.4*  --  27.2* 27.9*  MCV 134.2*  --  132.7* 134.1*  MCH 42.5*  --  41.5* 40.9*  MCHC 31.6  --  31.3 30.5  RDW 20.8*  --  20.7* 20.6*  PLT 248 200 195 207   Cardiac Enzymes Recent Labs  Lab 01/27/2019 1200 01/07/2019 1423 01/16/2019 2137 01/09/19 0330  TROPONINI <0.03 0.03* <0.03 <0.03   No results for input(s): TROPIPOC in the last 168 hours.  BNP Recent Labs  Lab 01/05/2019 1426  BNP 874.0*    DDimer  Recent Labs  Lab 01/18/2019 1426  DDIMER 0.28    Radiology/Studies:  Ct Chest Wo Contrast  Result Date: 01/12/2019 CLINICAL DATA:   Shortness of breath.  Pleural effusion. EXAM: CT CHEST WITHOUT CONTRAST TECHNIQUE: Multidetector CT imaging of the chest was performed following the standard protocol without IV contrast. COMPARISON:  Chest x-ray from same day. CT chest dated October 14, 2013. FINDINGS: Cardiovascular: Cardiomegaly with severe biatrial enlargement. No pericardial effusion. No thoracic aortic aneurysm. Coronary, aortic arch, and branch vessel atherosclerotic vascular disease. Mediastinum/Nodes: No enlarged mediastinal or axillary lymph nodes. Thyroid gland, trachea, and esophagus demonstrate no significant findings. Lungs/Pleura: Large bilateral pleural effusions with significant atelectasis of both lower lobes. Scattered mild interlobular septal thickening. No consolidation or pneumothorax. No suspicious pulmonary nodule. Upper Abdomen: No acute abnormality. Musculoskeletal: No chest wall mass or suspicious bone lesions identified. IMPRESSION: 1. Congestive heart failure. Large bilateral pleural effusions with significant atelectasis of both lower lobes. Mild interstitial pulmonary edema. 2. Aortic atherosclerosis (ICD10-I70.0). Electronically Signed   By: Titus Dubin M.D.   On: 01/29/2019 16:34   Dg Chest Port 1 View  Result Date: 01/10/2019 CLINICAL DATA:  Shortness of breath EXAM: PORTABLE CHEST 1 VIEW COMPARISON:  01/09/2019 FINDINGS: Cardiac shadow is stable. Bilateral pleural effusions and bibasilar infiltrates are again seen and stable. No new focal abnormality is noted. IMPRESSION: Stable pleural effusions and basilar airspace opacities. Given some technical variations in the film the overall appearance is stable. Electronically Signed   By: Inez Catalina M.D.   On: 01/10/2019 07:53   Dg Chest Port 1 View  Result Date: 01/09/2019 CLINICAL DATA:  Shortness of breath. EXAM: PORTABLE CHEST 1 VIEW COMPARISON:  01/10/2019 FINDINGS: Worsening bilateral airspace disease particularly in the left lung. Again noted are moderate  sized bilateral pleural effusions. The  heart appears to be enlarged. Negative for pneumothorax. IMPRESSION: Increased bilateral airspace disease is suggestive for pulmonary edema. Persistent bilateral pleural effusions. Electronically Signed   By: Markus Daft M.D.   On: 01/09/2019 09:15   Dg Chest Port 1 View  Result Date: 01/11/2019 CLINICAL DATA:  Shortness of breath with COPD and atrial fibrillation. EXAM: PORTABLE CHEST 1 VIEW COMPARISON:  07/03/2017 FINDINGS: Stable densities in the mid and lower chest are most compatible with bilateral pleural effusions with compressive atelectasis. Cannot exclude mild edema or central vascular congestion. Stable enlargement of the cardiac silhouette. IMPRESSION: Slightly enlarged central vascular structures compared to the previous examination and concerning for mild edema. Persistent bibasilar chest densities are most compatible with moderate sized pleural effusions with compressive atelectasis or associated airspace disease. Probable cardiomegaly. Electronically Signed   By: Markus Daft M.D.   On: 01/05/2019 13:15    Assessment and Plan:   1.  Atrial fibrillation with RVR on baseline history of permanent atrial fibrillation.  As an outpatient she is on Cartia XT 240 mg daily and Coumadin.  Current IV diltiazem dose does not approximate her outpatient dose, however this is limited by relatively low blood pressure with systolic in the 73V.  Confounding heart rate control is symptomatic anemia, acute on chronic diastolic heart failure with recent fluid overload and bilateral pleural effusions, also COPD with acute on chronic hypoxic respiratory failure.  2.  Acute on chronic diastolic heart failure, follow-up echocardiogram done yesterday shows LVEF 60 to 65%.  She has had recent fluid overload in the face of volume resuscitation and also holding of her outpatient diuretics.  3.  Moderate bilateral pleural effusions.  4.  COPD with acute on chronic hypoxic  respiratory failure.  5.  Severe RV dysfunction likely in association with chronic lung disease and moderate pulmonary hypertension.  6.  Symptomatic anemia likely due to GI bleeding with heme positive stools and supratherapeutic INR Coumadin.  Hemoglobin is 8.5.  7.  Acute renal insufficiency, creatinine 1.65.  At this point would continue on current dose of IV diltiazem, we will initiate IV amiodarone at the same time to hopefully provide additional heart rate control without further hypotension.  Continue to hold Coumadin and allow INR to drift down.  Agree with IV Lasix for further diuresis, creatinine has been steady at 1.6.  Therapeutic thoracenteses could be considered once her INR has come down appropriately.  Otherwise continue supportive measures for pulmonary status.  Signed, Rozann Lesches, MD  01/10/2019 9:06 AM

## 2019-01-10 NOTE — Progress Notes (Signed)
PROGRESS NOTE    Charlotte [name]  JJK:[id number] DOB: Jun 25, 1941 DOA: 01/27/2019 PCP: [name], MD   Brief Narrative:  HPI: Charlotte [name] is a 78 y.o. 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  States she has not been around any sick contacts and denies any chest pain, lightheadedness or dizziness but states that her breathing is significantly worsened and she currently does wear 4 L oxygen.  He is not having any cough or productive sputum and denies any fevers.  States that she is also been feeling weaker and a little bit more tired.  She was worked up and found to be in atrial fibrillation RVR and was given a small 500 mL bolus with improvement in her heart rate.  Patient still remained dyspneic and oxygen requirement had to be escalated to 6 L. TRH was called admit this patient for her acute on chronic hypoxic respiratory failure and dyspnea and is also found that she had a worsening anemia and was found to be FOBT positive so GI was consulted and will see the patient in the a.m.  ED Course: She had basic blood work done including CBC, CMP, had a chest x-ray done EKG and given a 500 mL bolus.  She was given 40 mg of IV Protonix and gastroenterology Dr. Gala Romney was consulted and he will see the patient tomorrow for her anemia and FOBT positive.  **Interim History CT of the chest without contrast was done and showed congestive  heart failure with large bilateral pleural effusions with significant atelectasis on both lower lobes and mild interstitial pulmonary edema along with aortic atherosclerosis.  IV fluids were discontinued and patient was started on diuresis.  She continues to be on the Cardizem drip at 5 and we will attempt to titrate to 7.5 mg/hr.  Echocardiogram was done today and showed a normal EF however diastolic dysfunction could not be assessed because of her A. fib and right ventricle did show severely reduced systolic function.  I spoke with Cardiology Dr. Domenic Polite who evaluated today and recommended continuing Diuresis and added IV Amiodarone for Rhythm Control .  GI has been evaluating the patient and feel no indication for endoscopy or colonoscopy given her tenuous status and supratherapeutic INR.  Radiology thoracentesis done today on the Left Side and removed 1200 mL Fluid however patient had a resultant small Pneumothorax. Hematology is to evaluate tomorrow given the patient's anemia.  Assessment & Plan:   Active Problems:   Polycythemia vera (HCC)   Chronic atrial fibrillation   Anticoagulant long-term use   HTN (hypertension)   Hypothyroidism   PVD (peripheral vascular disease) (HCC)   Dyspnea   Hypoxemia   CHF (congestive heart failure) (HCC)   Acute on chronic respiratory failure with hypoxia (HCC)   Anemia   Chronic respiratory failure with hypoxia (HCC)   Hyperlipidemia   Acute renal failure (ARF) (HCC)   GERD (gastroesophageal reflux disease)   Iron deficiency anemia due to chronic blood loss   Atrial fibrillation with RVR ([place])  Dyspnea/Acute on Chronic Respiratory Failure with Hypoxia -Multifactorial and likely in the setting of symptomatic anemia from suspected GI bleed, atrial fibrillation with RVR, and Hx of COPD along with now CHF and bilateral pleural effusions -FOBT was positive in the ED and will continue monitor hemoglobin/hematocrit and have gastroenterology weigh in -We  will place on a Cardizem drip and placed in stepdown unit and control heart rates improved; cardiology has now added IV amiodarone for better rhythm control -Checked echocardiogram as below -We will place on Xopenex/Atrovent every 6 scheduled for breathing difficulties -Currently holding Coumadin INR remains supratherapeutic still at 3.8 -Will obtain CT of the Chest w/o Contrast to rule out other etiologies; CT scan showed CHF with bilateral pleural effusions and aortic atherosclerosis -Admission chest x-ray showed slightly enlarged central vascular structures compared to the previous examination concerning for mild edema there is persistent bibasilar chest densities are most compatible with moderate sized pleural effusion with compressive atelectasis or airspace disease and probable cardiomegaly -Today's chest x-ray showed "Cardiac shadow is stable. Bilateral pleural effusions and bibasilar infiltrates are again seen and stable. No new focal abnormality is noted." -Contacted Radiology for Thoracentesis and Left side was done today and drained 1.2 Liters and subsequently she had a small Apical Pnuemothorax post Thoracentesis  -Currently patient wears 4 L of oxygen at home and had to be bumped up to 7 L high flow nasal cannula and 10 this AM but is now back to 7 -Currently holding her Torsemide given lower BP; IV fluids have now been stopped and diuresis has been starting -She is afebrile has no leukocytosis and is less concern for pneumonia -SARS-CoV-2 is negative -Continue Diuresis with IV Lasix but increased dose to 60 mg BID  -ABG this morning showed pH of 7.428, PCO2 of 30.9, PO2 61.1, bicarbonate level 21.6, and O2 saturation of 91.4% on 48.00 FiO2  Hypothyroidism -Patient's TSH was 0.141 and low -Checked Free T4 and was within normal limits of 1.61 -Continue home Levothyroxine of 125 mcg p.o. daily before breakfast  Hyperlipidemia -Continue with Pravastatin 10 mg p.o. nightly  Acute  Diastolic CHF and Severe Right Sided Ventricle Dysfuntion in a patient with Chronic Combined CHF -Appeared slightly volume overloaded and had lower extremity edema but patient states this is chronic setting of her recent surgeries and bypass with the right being worse than left -Chest x-ray showed slightly enlarged central vascular structures compared to the previous examination concerning for mild edema there is persistent bibasilar chest densities are most compatible with moderate sized pleural effusion with compressive atelectasis or airspace disease and probable cardiomegaly -EF is 60 to 65% in 2018 with indeterminate diastolic function due to atrial fibrillation and then D-shaped intraventricular septum was suggestive of right ventricular pressure/volume overload.  The left atrium was also severely dilated and the right ventricle systolic function was moderately reduced was that the patient had normal left ventricular size with an EF of 65% and she had severe biatrial enlargement and moderate pulmonary hypertension -Repeat Echocardiogram this visit as below and showed EF of 60-65% -Checked BNP and was 874.0 and Fluid Restrict to 1200 mL -Strict I's and O's and daily weights -Continue to monitor for signs and symptoms of volume overload -IVF has now Discontinued and Diuresis has started and increased to 60 mg BID  -CT Chest showed congestive heart failure with large bilateral pleural effusions with significant atelectasis of both lobes and mild interstitial pulmonary edema.  There is also noted to aortic atherosclerosis -Continue to Monitor Respiratory  Status and Fluid Status Carefully  Bilateral Pleural Effusions -Ordered thoracentesis and Left side was done and drained 1.2 Liters and subsequently had a Pneumothorax post procedure  -Continue with IV Lasix and low pressure permits -Continue monitor strict I's and O's and daily weights -Repeat CXR timed for 1600 and will repeat in AM    GERD -Given IV Pantoprazole 40 mg Once and will continue po 40 mg daily   Chronic Atrial Fibrillation now in RVR -Patient presented with rapid A. fib and RVR and was given a 500 mL bolus with improvement in heart rate -Cardizem was ordered but never initiated given her low blood pressures will now be initiated; Currently on 5 mg/hr  -Hold all antihypertensives but will resume home Cardizem of 240 mg daily when she is off of Cardizem drip -Admitted into the stepdown unit and continue IV fluid hydration with normal saline at 50 mL's stopped and now Diuresed  -Initial cardiac troponin was less than 0.03 will continue to monitor and trend -Hold Coumadin at this time given supratherapeutic INR and is now 3.1 -Continue to monitor on telemetry  -Cardiology was consulted and they recommended adding IV Amiodarone    Lactic Acidosis -Was mildly elevated at 2.2 is now trended down to 1.1 -Gentle IV fluid hydration with normal saline rate of 50 mL stopped yesterday and diuresis was started given her significant volume overload  Peripheral Vascular Disease -Recently saw Dr. [name] and had a amputation of the fifth pinky toe -Currently holding aspirin 81 mg given FOBT positive and supratherapeutic INR for now and likely add back in AM   Acute Kidney Injury on CKD Stage 3 Metabolic Acidosis  -Patient's Baseline Cr appears to be from 0.8-1.1 -BUN/Cr on Admission was 53/1.76 questionable if this is from blood loss but likely this is now in the setting of volume overload from atrial fibrillation with RVR and CHF; BUN/creatinine slightly improved and is 55/1.65 and stable from yesterday  -Avoid Nephrotoxic Medications, Contrast Dyes, and Hypotension -Given IVF Hydration with NS 500 mL in the ED and was started on gentle IV fluid hydration at 50 mL's per hour but then discontinued and started on diuresis -Started IV Lasix 40 mg twice daily and increased to 60 mg BID  -CO2 is now 21 and AG is  9 -Currently Holding Torsemide given low blood pressures and AKI -Continue to Monitor and Trend Renal Fxn -Repeat CBC in AM   Macrocytic Anemia/?Chronic Blood Loss Anemia/ Multifactorial Anemia including ?Hemolysis History of Polycythemia Vera on Hydroxyurea -Patient's Hb/Hct on Admission was 9.3/29.4 on Admission and is further drop down now to 8.5/27.9 (Stable from yesterday) -In December Hb was 15.1 but has ranges previously from Mid 8-10's -Has a documented Hx of Anemia of Chronic Blood Loss -FOBT was Positive and she has a Supratherapeutic INR; Schistoyctes were seen on Peripheral Smear but we will be checking an LDH, haptoglobin, reticulocyte count as well as an anemia panel -I discussed the case with hematology Dr. Delton Coombes who feels that the patient can have a slight schistocytosis in the setting of renal insufficiency -Will Check LDH and was 629, Haptoglobin is pending, -Reticulocyte count done and Retic Ct Pct ws 2.22, Absolute Retic Ct was 49.3, and Immature Retic Fract was 32.6 -Anemia panel done and showed an iron level of 34, U IBC 186, TIBC of 220, saturation ratios of 15%, ferritin level 161, folate level 12.7, and vitamin B12 level of 714 -DIC panel done and showed a d-dimer of 0.28, fibrinogen level  of 366, PT of 35.7, INR 3.7, APTT of 80, -Hold Hydroxyurea at the recommendation of hematology -Given IV Protonix 40 mg in the ED; Hold Coumadin for now   -Will get GI opinion and Appreciate Dr. Roseanne Kaufman evaluation; Dr. Gala Romney does not feel that the patient is ever going to be a candidate for endoscopic evaluation given her significant comorbidities and feels that the hematologist stool was in the setting of coagulopathy and antiplatelet therapy with aspirin and could be due to any number of etiologies throughout the GI tract currently she is devoid of any GI symptoms and Dr. Gala Romney recommends following clinically and if Coumadin is to be continued she would need better control of her  INR and he states that if aspirin is to be continued then she would need daily acid suppression therapy for cytoprotective effect with a PPI  Hyperbilirubinemia -Paitent's T Bili was 1.4 and then repeated was 1.2 with a direct bilirubin of 0.4 and an indirect of 0.8; NOW T bili is 1.5 -Likley in the setting of ? Hemolysis but could also be from Hydroxyurea -I discussed the case with medical oncology and hematology Dr. Delton Coombes who recommends holding the hydroxyurea at this time and recommends trending the bilirubin and getting a direct and indirect bilirubin which was done above -Gastroenterology recommending not evaluating bilirubin further unless it rises above the 3-4 range and or if the transaminases bump -Continue to Monitor and Trend -Repeat CMP in AM   DVT prophylaxis: SCDs; Anticoagulated with Coumadin which is currently being held  Code Status: DO NOT RESUSCITATE  Family Communication: No family present at bedside  Disposition Plan: Remain in the SDU  Consultants:   Gastroenterology  Hematology  Cardiology  Radiology    Procedures:  ECHOCARDIOGRAM on 01/09/2019 IMPRESSIONS    1. The left ventricle has normal systolic function with an ejection fraction of 60-65%. The cavity size was normal. Left ventricular diastolic function could not be evaluated secondary to atrial fibrillation.  2. The right ventricle has severely reduced systolic function. The cavity was moderately enlarged. There is no increase in right ventricular wall thickness. Right ventricular systolic pressure is moderately elevated with an estimated pressure of 49.1  mmHg.  3. Left atrial size was severely dilated.  4. Right atrial size was severely dilated.  5. Large pleural effusion.  6. Trivial pericardial effusion is present.  7. No evidence of mitral valve stenosis.  8. No stenosis of the aortic valve.  9. The aortic root and ascending aorta are normal in size and structure. 10. The inferior vena  cava was normal in size with <50% respiratory variability. 11. The interatrial septum was not assessed.  FINDINGS  Left Ventricle: The left ventricle has normal systolic function, with an ejection fraction of 60-65%. The cavity size was normal. There is no increase in left ventricular wall thickness. Left ventricular diastolic function could not be evaluated  secondary to atrial fibrillation.  Right Ventricle: The right ventricle has severely reduced systolic function. The cavity was moderately enlarged. There is no increase in right ventricular wall thickness. Right ventricular systolic pressure is moderately elevated with an estimated  pressure of 49.1 mmHg. Pacing wire/catheter visualized in the right ventricle.  Left Atrium: Left atrial size was severely dilated.  Right Atrium: Right atrial size was severely dilated. Right atrial pressure is estimated at 3 mmHg.  Interatrial Septum: The interatrial septum was not assessed.  Pericardium: Trivial pericardial effusion is present. There is a large pleural effusion.  Mitral Valve: The  mitral valve is normal in structure. Mitral valve regurgitation is trivial by color flow Doppler. No evidence of mitral valve stenosis.  Tricuspid Valve: The tricuspid valve is normal in structure. Tricuspid valve regurgitation is mild by color flow Doppler.  Aortic Valve: The aortic valve is normal in structure. Aortic valve regurgitation was not visualized by color flow Doppler. There is No stenosis of the aortic valve.  Pulmonic Valve: The pulmonic valve was grossly normal. Pulmonic valve regurgitation is trivial by color flow Doppler.  Aorta: The aortic root and ascending aorta are normal in size and structure.  Venous: The inferior vena cava is normal in size with less than 50% respiratory variability.    +--------------+--------++  LEFT VENTRICLE            +----------------+---------++ +--------------+--------++  Diastology                     PLAX 2D                   +----------------+---------++ +--------------+--------++  LV e' lateral:   9.57 cm/s    LVIDd:         3.61 cm    +----------------+---------++ +--------------+--------++  LV E/e' lateral: 12.3         LVIDs:         2.29 cm    +----------------+---------++ +--------------+--------++  LV e' medial:    5.87 cm/s    LV PW:         0.92 cm    +----------------+---------++ +--------------+--------++  LV E/e' medial:  20.1         LV IVS:        0.99 cm    +----------------+---------++ +--------------+--------++  LVOT diam:     1.90 cm    +--------------+--------++  LV SV:         37 ml      +--------------+--------++  LV SV Index:   19.08      +--------------+--------++  LVOT Area:     2.84 cm   +--------------+--------++                            +--------------+--------++  +---------------+----------++  RIGHT VENTRICLE              +---------------+----------++  RV S prime:     12.60 cm/s   +---------------+----------++  RVSP:           54.1 mmHg    +---------------+----------++  +---------------+--------++------------++  LEFT ATRIUM               Index          +---------------+--------++------------++  LA diam:        5.60 cm   2.87 cm/m     +---------------+--------++------------++  LA Vol (A2C):   199.0 ml  102.03 ml/m   +---------------+--------++------------++  LA Vol (A4C):   163.0 ml  83.57 ml/m    +---------------+--------++------------++  LA Biplane Vol: 179.0 ml  91.77 ml/m    +---------------+--------++------------++ +------------+---------++-----------++  RIGHT ATRIUM            Index         +------------+---------++-----------++  RA Pressure: 8.00 mmHg                +------------+---------++-----------++  RA Area:     39.80 cm                +------------+---------++-----------++  RA Volume:   173.00  ml  88.70 ml/m   +------------+---------++-----------++    +-------------+-------++  AORTA                    +-------------+-------++  Ao Root diam: 2.40 cm   +-------------+-------++  +--------------+----------++  +---------------+-----------++  MITRAL VALVE                  TRICUSPID VALVE               +--------------+----------++  +---------------+-----------++  MV Area (PHT): 3.48 cm       TR Peak grad:   46.1 mmHg     +--------------+----------++  +---------------+-----------++  MV PHT:        63.22 msec     TR Vmax:        347.00 cm/s   +--------------+----------++  +---------------+-----------++  MV Decel Time: 218 msec       Estimated RAP:  8.00 mmHg     +--------------+----------++  +---------------+-----------++ +--------------+-----------++  RVSP:           54.1 mmHg      MV E velocity: 118.00 cm/s   +---------------+-----------++ +--------------+-----------++                               +--------------+-------+                                SHUNTS                                                +--------------+-------+                                Systemic Diam: 1.90 cm                                +--------------+-------+    Antimicrobials:  Anti-infectives (From admission, onward)   None     Subjective: Seen And examined at bedside and she is hard of hearing but when I get close turn yellow in her ear she understands.  States that she is doing okay and states breathing is stable on may be slightly better.  Denies chest pain, lightheadedness or dizziness.  No nausea vomiting but remains swollen.  Objective: Vitals:   01/10/19 1300 01/10/19 1324 01/10/19 1418 01/10/19 1500  BP: (!) 98/57 (!) 94/54  111/68  Pulse: (!) 35   (!) 112  Resp: (!) 28   (!) 24  Temp:      TempSrc:      SpO2: 99% 92% 95% 99%  Weight:      Height:        Intake/Output Summary (Last 24 hours) at 01/10/2019 1616 Last data filed at 01/10/2019 1513 Gross per 24 hour  Intake 1365.62 ml  Output 200 ml  Net 1165.62 ml   Filed Weights   01/19/2019 1730 01/09/19 0545 01/10/19 0500   Weight: 71.7 kg 73.7 kg 73.7 kg   Examination: Physical Exam:  Constitutional: Thin chronically ill-appearing Caucasian female in no real respiratory distress appears calmer today  Eyes: Lids and conjunctive are normal.  Sclera anicteric ENMT: External ears nose  appear normal.  Very hard of hearing Neck: Appears supple no JVD Respiratory: Diminished auscultation bilaterally with no appreciable wheezing, rales but did have significant crackles and coarse breath sounds.  She is slightly tachypneic and wearing 10 L high flow nasal cannula Cardiovascular: Irregularly irregular and tachycardic with 1-2+ lower extremity edema bilaterally with the right being worse compared to left Abdomen: Soft, nontender, nondistended backslash present GU: Deferred Musculoskeletal: Amputated fifth right pinky toe Skin: No appreciable rashes or lesions on to skin evaluation but does have a pressure ulcer on the backside Neurologic: Cranial nerves II through XII gross intact no appreciable focal deficits except that she is very hard of hearing Psychiatric: Normal judgment and insight.  Patient is awake, alert, oriented.  Has a slightly anxious mood but is improved  Data Reviewed: I have personally reviewed following labs and imaging studies  CBC: Recent Labs  Lab 01/09/2019 1200 01/14/2019 1426 01/09/19 0331 01/10/19 0515  WBC 6.0  --  5.9 5.4  NEUTROABS  --   --  4.6 4.3  HGB 9.3*  --  8.5* 8.5*  HCT 29.4*  --  27.2* 27.9*  MCV 134.2*  --  132.7* 134.1*  PLT 248 200 195 397   Basic Metabolic Panel: Recent Labs  Lab 01/31/2019 1200 01/16/2019 1426 01/09/19 0331 01/09/19 1933 01/10/19 0515 01/10/19 0839  NA 138  --  136 136  --  135  K 4.5  --  4.2 3.9  --  3.8  CL 105  --  108 104  --  105  CO2 20*  --  21* 20*  --  21*  GLUCOSE 106*  --  109* 97  --  95  BUN 53*  --  53* 54*  --  55*  CREATININE 1.76*  --  1.63* 1.66*  --  1.65*  CALCIUM 8.6*  --  8.3* 8.4*  --  8.4*  MG  --  2.5* 2.3  --  2.1   --   PHOS  --  4.5 4.2  --  4.1  --    GFR: Estimated Creatinine Clearance: 33.5 mL/min (A) (by C-G formula based on SCr of 1.65 mg/dL (H)). Liver Function Tests: Recent Labs  Lab 01/31/2019 1200 01/26/2019 1426 01/09/19 0331 01/09/19 1933 01/10/19 0839  AST 14* 14* 13* 13* 12*  ALT 10 7 9 9 7   ALKPHOS 74 72 65 64 60  BILITOT 1.4* 1.2 1.6* 1.4* 1.5*  PROT 6.6 6.6 6.0* 6.3* 5.8*  ALBUMIN 3.6 3.6 3.3* 3.4* 3.1*   No results for input(s): LIPASE, AMYLASE in the last 168 hours. No results for input(s): AMMONIA in the last 168 hours. Coagulation Profile: Recent Labs  Lab 01/10/2019 1246 01/25/2019 1426 01/09/19 0331 01/10/19 0839  INR 3.7* 3.7* 3.8* 3.1*   Cardiac Enzymes: Recent Labs  Lab 01/21/2019 1200 01/07/2019 1423 01/04/2019 2137 01/09/19 0330  TROPONINI <0.03 0.03* <0.03 <0.03   BNP (last 3 results) No results for input(s): PROBNP in the last 8760 hours. HbA1C: Recent Labs    01/09/19 0331  HGBA1C 4.9   CBG: Recent Labs  Lab 01/09/19 0738 01/10/19 0755 01/10/19 1126  GLUCAP 97 90 75   Lipid Profile: No results for input(s): CHOL, HDL, LDLCALC, TRIG, CHOLHDL, LDLDIRECT in the last 72 hours. Thyroid Function Tests: Recent Labs    01/21/2019 1246 01/16/2019 2137  TSH 0.141*  --   FREET4  --  1.61   Anemia Panel: Recent Labs    01/05/2019 1412 01/04/2019 1426  VITAMINB12 714  --   FOLATE  --  12.7  FERRITIN 161  --   TIBC 220*  --   IRON 34  --   RETICCTPCT  --  2.2   Sepsis Labs: Recent Labs  Lab 01/19/2019 1200 01/24/2019 1426  LATICACIDVEN 2.2* 1.1    Recent Results (from the past 240 hour(s))  Blood culture (routine x 2)     Status: None (Preliminary result)   Collection Time: 01/04/2019 12:00 PM  Result Value Ref Range Status   Specimen Description BLOOD RIGHT ARM  Final   Special Requests   Final    Blood Culture adequate volume BOTTLES DRAWN AEROBIC AND ANAEROBIC   Culture   Final    NO GROWTH 2 DAYS Performed at [facility], [address], [place], [place] [address]    Report Status PENDING  Incomplete  SARS Coronavirus 2 (CEPHEID - Performed in [place] hospital lab), Hosp Order     Status: None   Collection Time: 01/22/2019 12:17 PM  Result Value Ref Range Status   SARS Coronavirus 2 NEGATIVE NEGATIVE Final    Comment: (NOTE) If result is NEGATIVE SARS-CoV-2 target nucleic acids are NOT DETECTED. The SARS-CoV-2 RNA is generally detectable in upper and lower  respiratory specimens during the acute phase of infection. The lowest  concentration of SARS-CoV-2 viral copies this assay can detect is 250  copies / mL. A negative result does not preclude SARS-CoV-2 infection  and should not be used as the sole basis for treatment or other  patient management decisions.  A negative result may occur with  improper specimen collection / handling, submission of specimen other  than nasopharyngeal swab, presence of viral mutation(s) within the  areas targeted by this assay, and inadequate number of viral copies  (<250 copies / mL). A negative result must be combined with clinical  observations, patient history, and epidemiological information. If result is POSITIVE SARS-CoV-2 target nucleic acids are DETECTED. The SARS-CoV-2 RNA is generally detectable in upper and lower  respiratory specimens dur ing the acute phase of infection.  Positive  results are indicative of active infection with SARS-CoV-2.  Clinical  correlation with patient history and other diagnostic information is  necessary to determine patient infection status.  Positive results do  not rule out bacterial infection or co-infection with other viruses. If result is PRESUMPTIVE POSTIVE SARS-CoV-2 nucleic acids MAY BE PRESENT.   A presumptive positive result was obtained on the submitted specimen  and confirmed on repeat testing.  While 2019 novel coronavirus  (SARS-CoV-2) nucleic acids may be present in the submitted sample  additional confirmatory testing may be  necessary for epidemiological  and / or clinical management purposes  to differentiate between  SARS-CoV-2 and other Sarbecovirus currently known to infect humans.  If clinically indicated additional testing with an alternate test  methodology [phone]) is advised. The SARS-CoV-2 RNA is generally  detectable in upper and lower respiratory sp ecimens during the acute  phase of infection. The expected result is Negative. Fact Sheet for Patients:  StrictlyIdeas.no Fact Sheet for Healthcare Providers: BankingDealers.co.za This test is not yet approved or cleared by the [place] FDA and has been authorized for detection and/or diagnosis of SARS-CoV-2 by FDA under an Emergency Use Authorization (EUA).  This EUA will remain in effect (meaning this test can be used) for the duration of the COVID-19 declaration under Section 564(b)(1) of the Act, 21 U.S.C. section 360bbb-3(b)(1), unless the authorization is terminated or revoked  sooner. Performed at [facility], [address], [place], [place] [address]   Blood culture (routine x 2)     Status: None (Preliminary result)   Collection Time: 01/12/2019 12:46 PM  Result Value Ref Range Status   Specimen Description LEFT ANTECUBITAL  Final   Special Requests   Final    BOTTLES DRAWN AEROBIC AND ANAEROBIC Blood Culture adequate volume   Culture   Final    NO GROWTH 2 DAYS Performed at [facility], [address], [place], [place] [address]    Report Status PENDING  Incomplete  MRSA PCR Screening     Status: None   Collection Time: 01/07/2019  7:57 PM  Result Value Ref Range Status   MRSA by PCR NEGATIVE NEGATIVE Final    Comment:        The GeneXpert MRSA Assay (FDA approved for NASAL specimens only), is one component of a comprehensive MRSA colonization surveillance program. It is not intended to diagnose MRSA infection nor to guide or monitor treatment for MRSA infections. Performed at  [facility], [address], [place], [place] [address]   Gram stain     Status: None   Collection Time: 01/10/19 12:43 PM  Result Value Ref Range Status   Specimen Description PLEURAL  Final   Special Requests NONE  Final   Gram Stain   Final    CYTOSPIN SMEAR NO ORGANISMS SEEN WBC PRESENT,BOTH PMN AND MONONUCLEAR Performed at [facility], [address], [place], [place] [address]    Report Status 01/10/2019 FINAL  Final    RN Pressure Injury Documentation and I am in current agreement with the RN's Assessment Pressure Injury 01/27/2019 Sacrum Right Stage II -  Partial thickness loss of dermis presenting as a shallow open ulcer with a red, pink wound bed without slough. round, open, red circular wound (Active)  01/11/2019 1700  Location: Sacrum  Location Orientation: Right  Staging: Stage II -  Partial thickness loss of dermis presenting as a shallow open ulcer with a red, pink wound bed without slough.  Wound Description (Comments): round, open, red circular wound  Present on Admission: Yes     Pressure Injury 01/26/2019 Foot Right Stage I -  Intact skin with non-blanchable redness of a localized area usually over a bony prominence. skin intact;discoloration;no drainage (Active)  01/13/2019 1900  Location: Foot  Location Orientation: Right  Staging: Stage I -  Intact skin with non-blanchable redness of a localized area usually over a bony prominence.  Wound Description (Comments): skin intact;discoloration;no drainage  Present on Admission: Yes    Radiology Studies: Dg Chest Port 1 View  Result Date: 01/10/2019 CLINICAL DATA:  LEFT pleural effusion post thoracentesis EXAM: PORTABLE CHEST 1 VIEW COMPARISON:  Earlier study of 01/10/2019 at 0527 hours FINDINGS: Enlargement of cardiac silhouette with pulmonary vascular congestion. Minimal pulmonary edema, slightly improved. Bibasilar effusions and atelectasis, decreased on LEFT since earlier exam post thoracentesis. Small LEFT apically  pneumothorax time without mediastinal shift. Osseous structures unremarkable. IMPRESSION: Small LEFT apical pneumothorax post thoracentesis. Findings called to Dr. Alfredia Ferguson on 01/10/2019 at 1420 hours. Follow-up chest radiograph will be ordered for 1800 hours this evening. Electronically Signed   By: Lavonia Dana M.D.   On: 01/10/2019 14:22   Dg Chest Port 1 View  Result Date: 01/10/2019 CLINICAL DATA:  Shortness of breath EXAM: PORTABLE CHEST 1 VIEW COMPARISON:  01/09/2019 FINDINGS: Cardiac shadow is stable. Bilateral pleural effusions and bibasilar infiltrates are again seen and stable. No new  focal abnormality is noted. IMPRESSION: Stable pleural effusions and basilar airspace opacities. Given some technical variations in the film the overall appearance is stable. Electronically Signed   By: Inez Catalina M.D.   On: 01/10/2019 07:53   Dg Chest Port 1 View  Result Date: 01/09/2019 CLINICAL DATA:  Shortness of breath. EXAM: PORTABLE CHEST 1 VIEW COMPARISON:  01/07/2019 FINDINGS: Worsening bilateral airspace disease particularly in the left lung. Again noted are moderate sized bilateral pleural effusions. The heart appears to be enlarged. Negative for pneumothorax. IMPRESSION: Increased bilateral airspace disease is suggestive for pulmonary edema. Persistent bilateral pleural effusions. Electronically Signed   By: Markus Daft M.D.   On: 01/09/2019 09:15   US Thoracentesis Asp Pleural Space W/img Guide  Result Date: 01/10/2019 INDICATION: LEFT pleural effusion EXAM: ULTRASOUND GUIDED DIAGNOSTIC AND THERAPEUTIC LEFT THORACENTESIS MEDICATIONS: None. COMPLICATIONS: None immediate. PROCEDURE: Procedure, benefits, and risks of procedure were discussed with patient. Written informed consent for procedure was obtained. Time out protocol followed. Pleural effusion localized by ultrasound at the posterior LEFT hemithorax. Skin prepped and draped in usual sterile fashion. Skin and soft tissues anesthetized with 8 mL of 1%  lidocaine. 8 French thoracentesis catheter placed into the LEFT pleural space. 1.2 L of yellow LEFT pleural fluid aspirated by syringe pump. Procedure tolerated well by patient without immediate complication. FINDINGS: A total of approximately 1.2 L of LEFT pleural fluid was removed. Samples were sent to the laboratory as requested by the clinical team. IMPRESSION: Successful ultrasound guided LEFT thoracentesis yielding 1.2 L of pleural fluid. Electronically Signed   By: Lavonia Dana M.D.   On: 01/10/2019 14:15   Scheduled Meds:  Chlorhexidine Gluconate Cloth  6 each Topical Daily   furosemide  60 mg Intravenous BID   gabapentin  100 mg Oral TID   guaiFENesin  600 mg Oral BID   ipratropium  0.5 mg Nebulization Q6H   levalbuterol  0.63 mg Nebulization Q6H   levothyroxine  125 mcg Oral Q0600   mouth rinse  15 mL Mouth Rinse BID   pantoprazole  40 mg Oral Daily   pravastatin  10 mg Oral q1800   Continuous Infusions:  amiodarone 30 mg/hr (01/10/19 1538)   diltiazem (CARDIZEM) infusion 5 mg/hr (01/10/19 1402)    LOS: 2 days   Kerney Elbe, DO [facility] PAGER is on AMION  If 7PM-7AM, please contact night-coverage [email or web address] Password [email or web address] 01/10/2019, 4:16 PM

## 2019-01-10 NOTE — Care Management Important Message (Signed)
Important Message  Patient Details  Name: Charlotte [name] MRN: [id number] Date of Birth: Apr 11, 1941   Medicare Important Message Given:  Yes    Tommy Medal 01/10/2019, 3:24 PM

## 2019-01-10 NOTE — Progress Notes (Signed)
No BIPAP in room at this time.

## 2019-01-10 NOTE — Plan of Care (Signed)
  Problem: Acute Rehab PT Goals(only PT should resolve) Goal: Pt Will Go Supine/Side To Sit Outcome: Progressing Flowsheets (Taken 01/10/2019 1318) Pt will go Supine/Side to Sit: with supervision Goal: Patient Will Transfer Sit To/From Stand Outcome: Progressing Flowsheets (Taken 01/10/2019 1318) Patient will transfer sit to/from stand: with min guard assist Goal: Pt Will Transfer Bed To Chair/Chair To Bed Outcome: Progressing Flowsheets (Taken 01/10/2019 1318) Pt will Transfer Bed to Chair/Chair to Bed: min guard assist Goal: Pt Will Ambulate Outcome: Progressing Flowsheets (Taken 01/10/2019 1318) Pt will Ambulate: 25 feet; with moderate assist; with rolling walker   1:18 PM, 01/10/19 Talbot Grumbling, DPT Physical Therapist with [facility] [phone] office

## 2019-01-10 NOTE — Progress Notes (Signed)
MEDICATION RELATED CONSULT NOTE -   Pharmacy Consult for amiodarone drug interactions Indication: afib  No Known Allergies  Patient Measurements: Height: 6' (182.9 cm) Weight: 162 lb 7.7 oz (73.7 kg) IBW/kg (Calculated) : 73.1  Vital Signs: Temp: 97.8 F (36.6 C) (06/08 1124) Temp Source: Oral (06/08 1124) BP: 104/60 (06/08 1000) Pulse Rate: 117 (06/08 1000) Intake/Output from previous day: 06/07 0701 - 06/08 0700 In: 1191.9 [P.O.:1080; I.V.:111.9] Out: 200 [Urine:200] Intake/Output from this shift: No intake/output data recorded.  Labs: Recent Labs    01/25/2019 1200 01/09/2019 1426 01/09/19 0331 01/09/19 1933 01/10/19 0515 01/10/19 0839  WBC 6.0  --  5.9  --  5.4  --   HGB 9.3*  --  8.5*  --  8.5*  --   HCT 29.4*  --  27.2*  --  27.9*  --   PLT 248 200 195  --  207  --   APTT  --  80*  --   --   --   --   CREATININE 1.76*  --  1.63* 1.66*  --  1.65*  MG  --  2.5* 2.3  --  2.1  --   PHOS  --  4.5 4.2  --  4.1  --   ALBUMIN 3.6 3.6 3.3* 3.4*  --  3.1*  PROT 6.6 6.6 6.0* 6.3*  --  5.8*  AST 14* 14* 13* 13*  --  12*  ALT 10 7 9 9   --  7  ALKPHOS 74 72 65 64  --  60  BILITOT 1.4* 1.2 1.6* 1.4*  --  1.5*  BILIDIR  --  0.4*  --   --   --   --   IBILI  --  0.8  --   --   --   --    Estimated Creatinine Clearance: 33.5 mL/min (A) (by C-G formula based on SCr of 1.65 mg/dL (H)).   Microbiology:  Medical History: Past Medical History:  Diagnosis Date  . Anxiety   . Arthritis   . Atrial fibrillation, chronic    a. on Coumadin for anticoagulation.   . Cellulitis 06/29/2017   Right leg  . Chronic hypoxemic respiratory failure (HCC)    4 lpm  . COPD (chronic obstructive pulmonary disease) ([place])   . 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 DVT (deep venous thrombosis) ([place])   . Essential hypertension   . GERD (gastroesophageal reflux disease)   . HOH (hard of hearing)   . Hyperlipidemia   . Hypothyroidism   . Iron deficiency anemia due to chronic blood loss 03/18/2018   . Open wound    Right groin  . Peripheral neuropathy   . Peripheral vascular disease ([place])   . 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 Ulcer of ankle ([place])    Bilateral 2015  . Varicose veins     Medications:  Medications Prior to Admission  Medication Sig Dispense Refill Last Dose  . acetaminophen (PAIN RELIEVER) 500 MG tablet Take 1,000 mg by mouth every 6 (six) hours as needed for mild pain or moderate pain.   02/01/2019 at Unknown time  . aspirin EC 81 MG EC tablet Take 1 tablet (81 mg total) by mouth daily.   01/21/2019 at Unknown time  . CARTIA XT 240 MG 24 hr capsule TAKE 1 CAPSULE BY MOUTH ONCE DAILY (Patient taking differently: Take 240 mg by mouth every morning. ) 90 capsule 3 01/12/2019 at Unknown time  . gabapentin (NEURONTIN)  300 MG capsule Take 300 mg by mouth 3 (three) times daily.   01/09/2019 at Unknown time  . hydroxyurea (HYDREA) 500 MG capsule Take 2 capsules (1,000 mg total) by mouth daily. May take with food to minimize GI side effects. (Patient taking differently: Take 1,000 mg by mouth every morning. May take with food to minimize GI side effects.) 180 capsule 3 01/04/2019 at Unknown time  . levothyroxine (SYNTHROID) 112 MCG tablet Take 112 mcg by mouth every morning.   01/25/2019 at Unknown time  . pravastatin (PRAVACHOL) 20 MG tablet Take 10 mg by mouth at bedtime.    01/07/2019 at Unknown time  . torsemide (DEMADEX) 20 MG tablet TAKE 1 TABLET BY MOUTH ONCE DAILY AS NEEDED **MAY  TAKE  FOR  WEIGHT  GAIN  OF  3  LBS  OVER  24  HOURS** (Patient taking differently: Take 20 mg by mouth daily as needed (for weight gain over 3 pounds). ) 30 tablet 6 01/15/2019 at Unknown time  . warfarin (COUMADIN) 5 MG tablet Take 1 tablet daily except 1/2 tablet on Sundays, Tuesdays and Thursdays or as directed (Patient taking differently: Take 2.5-5 mg by mouth See admin instructions. Take 1 tablet (5mg  total) daily except 1/2 tablet (2.5mg  total) on Sundays, Tuesdays and Thursdays or as directed.) 71  tablet 2 01/07/2019 at 1800    Assessment/Plan: 78 y.o. female with a history of permanent atrial fibrillation, chronic diastolic heart failure, PAD, hypertension, polycythemia vera, and COPD with chronic hypoxic respiratory failure. She presented with worsening SOB and weakness. Patient on chronic coumadin but is currently on hold d/t supratherapeutic INR which is trickling down and 3.1 today. Reviewed medications for drug interactions.  Significant drug interaction is identified with amiodarone and coumadin. Likely will need lower dose of coumadin.  Monitor with PT-INR  Isac Sarna, BS [place], BCPS Clinical Pharmacist Pager [phone] 01/10/2019,11:49 AM

## 2019-01-10 NOTE — Progress Notes (Signed)
ANTICOAGULATION CONSULT NOTE -   Pharmacy Consult for warfarin Indication: atrial fibrillation  No Known Allergies  Patient Measurements: Height: 6' (182.9 cm) Weight: 162 lb 7.7 oz (73.7 kg) IBW/kg (Calculated) : 73.1   Vital Signs: Temp: 98.2 F (36.8 C) (06/08 0753) Temp Source: Oral (06/08 0753) BP: 98/67 (06/08 0800) Pulse Rate: 103 (06/08 0800)  Labs: Recent Labs    01/07/2019 1200 01/29/2019 1246 01/27/2019 1423 01/04/2019 1426 01/19/2019 2137 01/09/19 0330 01/09/19 0331 01/09/19 1933 01/10/19 0515  HGB 9.3*  --   --   --   --   --  8.5*  --  8.5*  HCT 29.4*  --   --   --   --   --  27.2*  --  27.9*  PLT 248  --   --  200  --   --  195  --  207  APTT  --   --   --  80*  --   --   --   --   --   LABPROT  --  36.0*  --  35.7*  --   --  36.5*  --   --   INR  --  3.7*  --  3.7*  --   --  3.8*  --   --   CREATININE 1.76*  --   --   --   --   --  1.63* 1.66*  --   TROPONINI <0.03  --  0.03*  --  <0.03 <0.03  --   --   --     Estimated Creatinine Clearance: 33.3 mL/min (A) (by C-G formula based on SCr of 1.66 mg/dL (H)).   Medical History: Past Medical History:  Diagnosis Date  . Anxiety   . Arthritis   . Atrial fibrillation, chronic    a. on Coumadin for anticoagulation.   . Cellulitis 06/29/2017   Right leg  . Chronic hypoxemic respiratory failure (HCC)    4 lpm  . COPD (chronic obstructive pulmonary disease) ([place])   . 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 DVT (deep venous thrombosis) ([place])   . Essential hypertension   . GERD (gastroesophageal reflux disease)   . HOH (hard of hearing)   . Hyperlipidemia   . Hypothyroidism   . Iron deficiency anemia due to chronic blood loss 03/18/2018  . Open wound    Right groin  . Peripheral neuropathy   . Peripheral vascular disease ([place])   . 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 Ulcer of ankle ([place])    Bilateral 2015  . Varicose veins     Medications:  Medications Prior to Admission  Medication Sig Dispense Refill  Last Dose  . acetaminophen (PAIN RELIEVER) 500 MG tablet Take 1,000 mg by mouth every 6 (six) hours as needed for mild pain or moderate pain.   01/13/2019 at Unknown time  . aspirin EC 81 MG EC tablet Take 1 tablet (81 mg total) by mouth daily.   01/18/2019 at Unknown time  . CARTIA XT 240 MG 24 hr capsule TAKE 1 CAPSULE BY MOUTH ONCE DAILY (Patient taking differently: Take 240 mg by mouth every morning. ) 90 capsule 3 01/24/2019 at Unknown time  . gabapentin (NEURONTIN) 300 MG capsule Take 300 mg by mouth 3 (three) times daily.   01/20/2019 at Unknown time  . hydroxyurea (HYDREA) 500 MG capsule Take 2 capsules (1,000 mg total) by mouth daily. May take with food to minimize GI side effects. (Patient  taking differently: Take 1,000 mg by mouth every morning. May take with food to minimize GI side effects.) 180 capsule 3 01/18/2019 at Unknown time  . levothyroxine (SYNTHROID) 112 MCG tablet Take 112 mcg by mouth every morning.   01/24/2019 at Unknown time  . pravastatin (PRAVACHOL) 20 MG tablet Take 10 mg by mouth at bedtime.    01/07/2019 at Unknown time  . torsemide (DEMADEX) 20 MG tablet TAKE 1 TABLET BY MOUTH ONCE DAILY AS NEEDED **MAY  TAKE  FOR  WEIGHT  GAIN  OF  3  LBS  OVER  24  HOURS** (Patient taking differently: Take 20 mg by mouth daily as needed (for weight gain over 3 pounds). ) 30 tablet 6 01/15/2019 at Unknown time  . warfarin (COUMADIN) 5 MG tablet Take 1 tablet daily except 1/2 tablet on Sundays, Tuesdays and Thursdays or as directed (Patient taking differently: Take 2.5-5 mg by mouth See admin instructions. Take 1 tablet (5mg  total) daily except 1/2 tablet (2.5mg  total) on Sundays, Tuesdays and Thursdays or as directed.) 71 tablet 2 01/07/2019 at 1800    Assessment: Pharmacy consulted to dose warfarin for patient with atrial fibrillation.  Patient's INR  remain supratherapeutic at 3.1.   Home dose of warfarin is listed as 2.5 mg Sun,Tue,Thu; 5 mg ROW.  Goal of Therapy:  INR 2-3 Monitor platelets by  anticoagulation protocol: Yes   Plan:  Hold warfarin x 1 dose. Monitor daily INR and s/s of bleeding.  Isac Sarna, BS [place], BCPS Clinical Pharmacist Pager [phone] 01/10/2019,8:15 AM

## 2019-01-10 NOTE — TOC Initial Note (Addendum)
Transition of Care [facility]) - Initial/Assessment Note    Patient Details  Name: Charlotte [name] MRN: [id number] Date of Birth: [birth year]  Transition of Care [facility]) CM/SW Contact:    Boneta Lucks, RN Phone Number: 01/10/2019, 3:43 PM  Clinical Narrative:         Patient admitted for Atrial Fib, Patient also seen by wound care for Right Foot and Right buttock. Spoke with patient and her spouse. PT recommended SNF. Patient wishes to go home. [name]- husband agrees he can handle her at home. States she has a hospital bed, Home 02- [employer], Wheel Chair, Environmental consultant, [employer] chair, and commode.  [name] states they used [facility] for wound care, but they no longer accept their insurance. He would like to use [facility] this time for any services needed. TOC to follow for orders. Referred to [facility] for RN, and PT.          Expected Discharge Plan: [place] Barriers to Discharge: Continued Medical Work up   Patient Goals and CMS Choice Patient states their goals for this hospitalization and ongoing recovery are:: To return home, agrees for the need of Home Health.  CMS Medicare.gov Compare Post Acute Care list provided to:: Patient Represenative (must comment) Choice offered to / list presented to : Spouse  Expected Discharge Plan and Services Expected Discharge Plan: [place]   Discharge Planning Services: CM Consult Post Acute Care Choice: [place] arrangements for the past 2 months: [place]: RN [place] Agency: [place] ([place]) Date [place]: 01/10/19 Time [place]: [place] Representative spoke with at [place]: [place] Arrangements/Services Living arrangements for the past 2 months: [place] with:: Self, Spouse Patient language and need for interpreter reviewed:: Yes Do you feel safe going back to the place where you live?:  Yes      Need for Family Participation in Patient Care: Yes (Comment) Care giver support system in place?: Yes (comment) Current home services: DME Criminal Activity/Legal Involvement Pertinent to Current Situation/Hospitalization: No - Comment as needed  Activities of Daily Living Home Assistive Devices/Equipment: Wheelchair, Environmental consultant (specify type) ADL Screening (condition at time of admission) Patient's cognitive ability adequate to safely complete daily activities?: Yes Is the patient deaf or have difficulty hearing?: Yes Does the patient have difficulty seeing, even when wearing glasses/contacts?: No Does the patient have difficulty concentrating, remembering, or making decisions?: Yes Patient able to express need for assistance with ADLs?: Yes Does the patient have difficulty dressing or bathing?: Yes Independently performs ADLs?: No Communication: Independent Dressing (OT): Needs assistance Is this a change from baseline?: Pre-admission baseline Grooming: Needs assistance Is this a change from baseline?: Pre-admission baseline Feeding: Independent Bathing: Needs assistance Is this a change from baseline?: Pre-admission baseline Toileting: Needs assistance Is this a change from baseline?: Pre-admission baseline In/Out Bed: Needs assistance Is this a change from baseline?: Pre-admission baseline Walks in Home: Needs assistance Is this a change from baseline?: Pre-admission baseline Does the patient have difficulty walking or climbing stairs?: Yes Weakness of Legs: Both Weakness of Arms/Hands: None  Permission Sought/Granted Permission sought to share information with : Case Manager, Customer service manager Permission granted to share information with : Yes, Verbal Permission Granted  Share Information with NAME: Romualdo Bolk  Permission granted to share info w AGENCY: [facility]  Permission granted to share info w Relationship: [name]- Spouse     Emotional  Assessment Appearance:: Appears stated age   Affect (typically observed): Accepting, Calm   Alcohol / Substance Use: Not Applicable Psych Involvement: No (comment)  Admission diagnosis:  SOB (shortness of breath) [R06.02] Atrial fibrillation with RVR (HCC) [I48.91] Anemia, unspecified type [D64.9] Dyspnea, unspecified type [R06.00] Dyspnea [R06.00] Patient Active Problem List   Diagnosis Date Noted  . Atrial fibrillation with RVR ([place]) 01/31/2019  . Iron deficiency anemia due to chronic blood loss 03/18/2018  . Group B streptococcal infection 12/15/2017  . Open wound of right foot 12/15/2017  . Right groin wound 12/11/2017  . CKD (chronic kidney disease) stage 3, GFR 30-59 ml/min (HCC)   . Foot pain, right   . Cellulitis and abscess of right leg 06/27/2017  . Dry gangrene ([place]) 06/27/2017  . Hypokalemia 06/27/2017  . Osteomyelitis of right foot ([place]) 06/27/2017  . S/P femoral-popliteal bypass surgery   . Edema of right lower extremity 06/08/2017  . GERD (gastroesophageal reflux disease) 06/08/2017  . Pressure ulcer 05/29/2017  . Sepsis ([place]) 05/28/2017  . Hypotension 05/28/2017  . Pressure injury of skin 03/14/2017  . Acute renal failure (ARF) ([place])   . Hyperlipidemia 03/05/2017  . Lower extremity arterial insufficiency, severe, right ([place]) 03/04/2017  . Chronic respiratory failure with hypoxia ([place]) 02/12/2017  . Cellulitis 07/03/2016  . Anemia 07/03/2016  . Cough 11/29/2015  . Hypoxemia 11/29/2015  . CHF (congestive heart failure) ([place]) 11/29/2015  . Acute on chronic respiratory failure with hypoxia ([place])   . Pleural effusion on left   . Acute on chronic diastolic (congestive) heart failure ([place])   . Dyspnea 11/22/2014  . Pleural effusion 11/22/2014  . Hard of hearing 11/22/2014  . Thrombocytosis ([place]) 11/22/2014  . Acute respiratory failure with hypoxemia ([place]) 11/22/2014  . Encounter for therapeutic drug monitoring 08/31/2013  . Aftercare following surgery of  the circulatory system, [facility] 12/21/2012  . Pain in limb 10/19/2012  . PVD (peripheral vascular disease) ([place]) 09/24/2012  . Swollen leg 09/24/2012  . Varicose veins of lower extremities with other complications 16/05/9603  . 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 Arterial occlusion due to stenosis ([place]) 08/20/2012  . Wound infection after surgery 01/07/2012  . Compartment syndrome, nontraumatic, lower extremity 11/29/2011  . Pain of right thigh 11/28/2011  . Hip fracture, right ([place]) 11/15/2011  . Chronic atrial fibrillation 11/15/2011  . Anticoagulant long-term use 11/15/2011  . HTN (hypertension) 11/15/2011  . Hypothyroidism 11/15/2011  . Polycythemia vera ([place]) 10/01/2011   PCP:  [name], MD Pharmacy:   [place], [place] - [place] [address] [place] [address] [place] [address] Phone: [phone] Fax: [phone]         Readmission Risk Interventions No flowsheet data found.

## 2019-01-10 NOTE — Progress Notes (Signed)
Thoracentesis complete no signs of distress. 1234ml removed.

## 2019-01-10 NOTE — Consult Note (Signed)
[place] Nurse wound consult note Reason for Consult:Stage 3 PI to right buttock, full thickness wound to right lateral foot.  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 Wound type: Pressure plus Moisture (patient is incontinent and is currently using an external female continence device (PurWick) Neuropathic Pressure Injury POA: Yes Measurement: Wound bed: (Both) Red, moist with scant serous drainage Drainage (amount, consistency, odor) As noted above Periwound:Right buttock ulcer with mild periwound maceration, foot is unremarkable Dressing procedure/placement/frequency: I will provide Pressure redistribution heel boots and a pressure redistribution chair pad. Topical care for the foot will continue with xeroform gauze dressing for antimicrobial and astringent properties topped with a silicone foam. The right buttock Stage 3 PI will be dressed with a silver hydrofiber to donate antimicrobial property and address mild maceration at periphery not managed by xeroform.  Turning and repositioning is in place per our routine and culture, but guidance is further provided for Nursing via the Orders.  This is explained to the patient and she is amenable to the POC.  [place] nursing team will not follow, but will remain available to this patient, the nursing and medical teams.  Please re-consult if needed. Thanks, Maudie Flakes, MSN, RN, [place], Arther Abbott  Pager# [phone]

## 2019-01-10 NOTE — Progress Notes (Signed)
Subjective: Patient has no acute complaints. Reports no BM since admission. States she had one episode of diarrhea at home Saturday before she came to the hospital. Denies melena or brbpr at home.  Denies abdominal pain, nausea, vomiting, heartburn, reflux. Ate a little oatmeal this morning and states she doesn't have a great appetite. Says this is about normal for her.   Objective: Vital signs in last 24 hours: Temp:  [98.2 F (36.8 C)-99.2 F (37.3 C)] 98.2 F (36.8 C) (06/08 0753) Pulse Rate:  [25-143] 103 (06/08 0800) Resp:  [18-34] 26 (06/08 0800) BP: (86-135)/(52-120) 98/67 (06/08 0800) SpO2:  [70 %-97 %] 82 % (06/08 0800) Weight:  [73.7 kg] 73.7 kg (06/08 0500) Last BM Date: 01/27/2019 General:   Alert and oriented. In no acute distress. Frail appearing. Hard of hearing.  Heart:  S1, S2 present. Irregular rhythm. Tachicardia present.   Abdomen:  Bowel sounds present, soft, non-tender, non-distended. No HSM or hernias noted. No rebound or guarding. No masses appreciated  Psych:  Alert and cooperative. Normal mood and affect.  Intake/Output from previous day: 06/07 0701 - 06/08 0700 In: 1191.9 [P.O.:1080; I.V.:111.9] Out: 200 [Urine:200]  Lab Results: Recent Labs    01/09/2019 1200 01/11/2019 1426 01/09/19 0331 01/10/19 0515  WBC 6.0  --  5.9 5.4  HGB 9.3*  --  8.5* 8.5*  HCT 29.4*  --  27.2* 27.9*  PLT 248 200 195 207   BMET Recent Labs    01/04/2019 1200 01/09/19 0331 01/09/19 1933  NA 138 136 136  K 4.5 4.2 3.9  CL 105 108 104  CO2 20* 21* 20*  GLUCOSE 106* 109* 97  BUN 53* 53* 54*  CREATININE 1.76* 1.63* 1.66*  CALCIUM 8.6* 8.3* 8.4*   LFT Recent Labs    01/09/2019 1426 01/09/19 0331 01/09/19 1933  PROT 6.6 6.0* 6.3*  ALBUMIN 3.6 3.3* 3.4*  AST 14* 13* 13*  ALT 7 9 9   ALKPHOS 72 65 64  BILITOT 1.2 1.6* 1.4*  BILIDIR 0.4*  --   --   IBILI 0.8  --   --    PT/INR Recent Labs    01/06/2019 1426 01/09/19 0331  LABPROT 35.7* 36.5*  INR 3.7* 3.8*     Studies/Results: Ct Chest Wo Contrast  Result Date: 01/29/2019 CLINICAL DATA:  Shortness of breath.  Pleural effusion. EXAM: CT CHEST WITHOUT CONTRAST TECHNIQUE: Multidetector CT imaging of the chest was performed following the standard protocol without IV contrast. COMPARISON:  Chest x-ray from same day. CT chest dated October 14, 2013. FINDINGS: Cardiovascular: Cardiomegaly with severe biatrial enlargement. No pericardial effusion. No thoracic aortic aneurysm. Coronary, aortic arch, and branch vessel atherosclerotic vascular disease. Mediastinum/Nodes: No enlarged mediastinal or axillary lymph nodes. Thyroid gland, trachea, and esophagus demonstrate no significant findings. Lungs/Pleura: Large bilateral pleural effusions with significant atelectasis of both lower lobes. Scattered mild interlobular septal thickening. No consolidation or pneumothorax. No suspicious pulmonary nodule. Upper Abdomen: No acute abnormality. Musculoskeletal: No chest wall mass or suspicious bone lesions identified. IMPRESSION: 1. Congestive heart failure. Large bilateral pleural effusions with significant atelectasis of both lower lobes. Mild interstitial pulmonary edema. 2. Aortic atherosclerosis (ICD10-I70.0). Electronically Signed   By: Titus Dubin M.D.   On: 01/29/2019 16:34   Dg Chest Port 1 View  Result Date: 01/10/2019 CLINICAL DATA:  Shortness of breath EXAM: PORTABLE CHEST 1 VIEW COMPARISON:  01/09/2019 FINDINGS: Cardiac shadow is stable. Bilateral pleural effusions and bibasilar infiltrates are again seen and stable. No new  focal abnormality is noted. IMPRESSION: Stable pleural effusions and basilar airspace opacities. Given some technical variations in the film the overall appearance is stable. Electronically Signed   By: Inez Catalina M.D.   On: 01/10/2019 07:53   Dg Chest Port 1 View  Result Date: 01/09/2019 CLINICAL DATA:  Shortness of breath. EXAM: PORTABLE CHEST 1 VIEW COMPARISON:  01/13/2019 FINDINGS:  Worsening bilateral airspace disease particularly in the left lung. Again noted are moderate sized bilateral pleural effusions. The heart appears to be enlarged. Negative for pneumothorax. IMPRESSION: Increased bilateral airspace disease is suggestive for pulmonary edema. Persistent bilateral pleural effusions. Electronically Signed   By: Markus Daft M.D.   On: 01/09/2019 09:15   Dg Chest Port 1 View  Result Date: 01/18/2019 CLINICAL DATA:  Shortness of breath with COPD and atrial fibrillation. EXAM: PORTABLE CHEST 1 VIEW COMPARISON:  07/03/2017 FINDINGS: Stable densities in the mid and lower chest are most compatible with bilateral pleural effusions with compressive atelectasis. Cannot exclude mild edema or central vascular congestion. Stable enlargement of the cardiac silhouette. IMPRESSION: Slightly enlarged central vascular structures compared to the previous examination and concerning for mild edema. Persistent bibasilar chest densities are most compatible with moderate sized pleural effusions with compressive atelectasis or associated airspace disease. Probable cardiomegaly. Electronically Signed   By: Markus Daft M.D.   On: 01/29/2019 13:15    Assessment: Debilitated 78 year old lady with multiple comorbidities admitted to the hospital with acute on chronic respiratory failure, CHF, acute on chronic anemia, occult blood positive stool in the setting of coagulopathy.    Acute on chronic anemia: Hemoglobin 9.3 on admission with macrocytic indices, down from 15.1 on 07/07/18. Hemoccult positive stool in the setting of Coumadin and Aspirin therapy. INR on 6/6 was supratherapeutic at 3.7. Positive hemoccult stool could be due to a number of etiologies throughout the GI tract; however no role for endoscopic evaluation at this time considering patients comorbidities. It appears her hemoglobin is typically in the 8-10 range.Coumadin has been on hold. INR today at 3.1. Hg today 8.5 and is remaining stable. No  signs of overt GI bleeding or other GI symptoms. At this point, with hemoglobin stable and no signs of overt GI bleeding, we will follow peripherally. Hospitalitis has consulted hematology for further evaluation/management of her anemia.   Mildly elevated total bilirubin.  Goes back a couple of years without prior fractionization. This is also nonspecific and is remaining stable. Venous congestion can produce this finding.  Medications are always a possibility but less likely a significant contribution given minimal elevation. An element of Gilberts syndrome could also be in the mix but fractionated values not known in the past. No need for further evaluation at this point unless it rises above the 3-4 range or there is an increase in transaminases.   Plan: 1. We will plan to follow peripherally as patient is stable from a GI stand point with no overt GI bleeding and Hgb remaining stable.  2. If aspirin is resumed chronically, continue PPI daily as an outpatient for cytoprotective effect.  3. No need to evaluate minimally elevated bilirubin at this point unless it raises above 3-4 range or transaminases bump.    LOS: 2 days    01/10/2019, 8:47 AM   Aliene Altes, PA-C [facility]

## 2019-01-10 NOTE — Evaluation (Signed)
Occupational Therapy Evaluation Patient Details Name: Charlotte [name] MRN: [id number] DOB: 03-16-1941 Today's Date: 01/10/2019    History of Present Illness Charlotte [name] is a 78 y.o. female with medical history significant for but not limited to chronic atrial fibrillation on anticoagulation with Coumadin, history of right leg cellulitis, history of combined CHF, history of COPD chronically on 4 L of home O2, anxiety and depression, essential hypertension, history of DVT, hyperlipidemia, hypothyroidism, history of iron deficiency anemia due to chronic blood loss, deafness and extremely hard of hearing, peripheral neuropathy, peripheral vascular disease, polycythemia vera, and other multiple medical comorbidities who presents with a 3 to 4-day history of dyspnea is progressively gotten worse   Clinical Impression   Patient working with PT upon therapy arrival and agreeable to participate in OT evaluation during PT evaluation. Patient is extremely HOH. She presents with decreased BUE strength and endurance, and unstable HR and oxygen stats which is causing her to require additional assistance for ADL tasks and functional transfers due to decreased activity tolerance. Recommend SNF at this time for discharge to focus on mentioned deficits. Skilled OT will follow patient acutely during stay.     Follow Up Recommendations  SNF    Equipment Recommendations  None recommended by OT       Precautions / Restrictions Precautions Precautions: Fall Precaution Comments: Monitor HR, PVC, and oxygen stats Restrictions Weight Bearing Restrictions: No      Mobility Bed Mobility Overal bed mobility: Needs Assistance                Transfers Overall transfer level: Needs assistance Equipment used: Rolling walker (2 wheeled) Transfers: Sit to/from Stand Sit to Stand: Min assist                  ADL either performed or assessed with clinical judgement   ADL Overall ADL's : Needs  assistance/impaired Eating/Feeding: Set up;Bed level   Grooming: Wash/dry face;Wash/dry hands;Sitting;Set up   Upper Body Bathing: Set up;Sitting   Lower Body Bathing: Total assistance;Sitting/lateral leans;Sit to/from stand   Upper Body Dressing : Set up;Sitting   Lower Body Dressing: Total assistance;Sitting/lateral leans;Sit to/from Health and safety inspector Details (indicate cue type and reason): Unable to complete side steps or transfer this session.                  Vision Baseline Vision/History: No visual deficits Patient Visual Report: No change from baseline              Pertinent Vitals/Pain Pain Assessment: No/denies pain     Hand Dominance Right   Extremity/Trunk Assessment Upper Extremity Assessment Upper Extremity Assessment: Generalized weakness   Lower Extremity Assessment Lower Extremity Assessment: Defer to PT evaluation       Communication Communication Communication: HOH   Cognition Arousal/Alertness: Awake/alert Behavior During Therapy: WFL for tasks assessed/performed Overall Cognitive Status: Within Functional Limits for tasks assessed                  Home Living Family/patient expects to be discharged to:: Private residence Living Arrangements: Spouse/significant other;Children(son) Available Help at Discharge: Family;Available 24 hours/day Type of Home: Mobile home Home Access: Ramped entrance     Home Layout: One level     Bathroom Shower/Tub: Teacher, early years/pre: Standard Bathroom Accessibility: No   Home Equipment: Environmental consultant - 2 wheels;Bedside commode;Wheelchair - manual;Tub bench   Additional Comments: Patient uses the BSC and sponge bathes.  Prior Functioning/Environment Level of Independence: Needs assistance  Gait / Transfers Assistance Needed: Using a RW for ambulation in the home. Wheelchair is use for community distance. Patient has a boot that she wears on her right foot. ADL's /  Homemaking Assistance Needed: Pt reports that she is able to complete bathing and dressing herself without assistance.             OT Problem List: Decreased strength;Decreased activity tolerance;Impaired balance (sitting and/or standing)      OT Treatment/Interventions: Self-care/ADL training;Modalities;Balance training;Therapeutic exercise;Neuromuscular education;Therapeutic activities;Energy conservation;Manual therapy;Patient/family education;DME and/or AE instruction    OT Goals(Current goals can be found in the care plan section) Acute Rehab OT Goals Patient Stated Goal: To get stronger OT Goal Formulation: With patient Time For Goal Achievement: 01/24/19 Potential to Achieve Goals: Good  OT Frequency: Min 2X/week           Co-evaluation PT/OT/SLP Co-Evaluation/Treatment: Yes Reason for Co-Treatment: To address functional/ADL transfers   OT goals addressed during session: ADL's and self-care;Strengthening/ROM      AM-PAC OT "6 Clicks" Daily Activity     Outcome Measure Help from another person eating meals?: None Help from another person taking care of personal grooming?: A Little Help from another person toileting, which includes using toliet, bedpan, or urinal?: A Lot Help from another person bathing (including washing, rinsing, drying)?: A Little Help from another person to put on and taking off regular upper body clothing?: A Little Help from another person to put on and taking off regular lower body clothing?: Total 6 Click Score: 16   End of Session Equipment Utilized During Treatment: Gait belt Nurse Communication: Mobility status  Activity Tolerance: Patient tolerated treatment well;Treatment limited secondary to medical complications (Comment)(elevated HR, inconsistent oygen stats) Patient left: in bed;with call bell/phone within reach;with bed alarm set;with nursing/sitter in room  OT Visit Diagnosis: Muscle weakness (generalized) (M62.81)                 Time: 0175-1025 OT Time Calculation (min): 32 min Charges:  OT General Charges $OT Visit: 1 Visit OT Evaluation $OT Eval Moderate Complexity: [place], OTR/L,CBIS  [phone]   , Clarene Duke 01/10/2019, 12:44 PM

## 2019-01-10 NOTE — Consult Note (Signed)
[facility] Consultation Oncology  Name: Charlotte [name]      MRN: [id number]    Location: IC05/IC05-01  Date: 01/10/2019 Time:5:23 PM   REFERRING PHYSICIAN: Dr. Alfredia Ferguson  REASON FOR CONSULT: Polycythemia vera   DIAGNOSIS: Macrocytic anemia  HISTORY OF PRESENT ILLNESS: She is a 78 year old white female with complex medical history admitted for worsening dyspnea on exertion.  She had a history of polycythemia vera and followed up in our clinic, last seen in December 2019 and was maintained on hydroxyurea 1 g daily.  On admission hemoglobin was 9.3 down from 15.1 in December 2019.  There is marked macrocytosis with MCV of 132.  She was also found to have supratherapeutic INR of 3.7.  She was also found to be in A. fib with rapid ventricular rate.  She is started on IV amiodarone for rhythm control.  CT scan was done on 01/27/2019 which showed CHF with large bilateral effusions with significant atelectasis at both lower lobes.  She underwent a left thoracentesis on 01/10/2019, 1.2 L of yellow fluid was removed.  There was a question of schistocytes on the initial peripheral blood smear.  She was also found to have mildly elevated total bilirubin anywhere between 1.2-1.5.  This was predominantly indirect bilirubin.  Patient is very hard of hearing and difficult to communicate with.  Patient has a history of CKD, however her creatinine was between 0.8 and 1.1 in December 2019.  However during this admission it was elevated at 1.6-1.7.  PAST MEDICAL HISTORY:   Past Medical History:  Diagnosis Date  . Anxiety   . Arthritis   . Atrial fibrillation, chronic    a. on Coumadin for anticoagulation.   . Cellulitis 06/29/2017   Right leg  . Chronic hypoxemic respiratory failure (HCC)    4 lpm  . COPD (chronic obstructive pulmonary disease) ([place])   . 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 DVT (deep venous thrombosis) ([place])   . Essential hypertension   . GERD (gastroesophageal reflux disease)   . HOH (hard  of hearing)   . Hyperlipidemia   . Hypothyroidism   . Iron deficiency anemia due to chronic blood loss 03/18/2018  . Open wound    Right groin  . Peripheral neuropathy   . Peripheral vascular disease ([place])   . 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 Ulcer of ankle ([place])    Bilateral 2015  . Varicose veins     ALLERGIES: No Known Allergies    MEDICATIONS: I have reviewed the patient's current medications.     PAST SURGICAL HISTORY Past Surgical History:  Procedure Laterality Date  . ABDOMINAL AORTAGRAM N/A 09/14/2012   Procedure: ABDOMINAL [name];  Surgeon: Serafina Mitchell, MD;  Location: [facility] CATH LAB;  Service: Cardiovascular;  Laterality: N/A;  . ABDOMINAL AORTOGRAM N/A 03/06/2017   Procedure: ABDOMINAL AORTOGRAM;  Surgeon: Elam Dutch, MD;  Location: [place] CV LAB;  Service: Cardiovascular;  Laterality: N/A;  . ABDOMINAL AORTOGRAM W/LOWER EXTREMITY N/A 09/21/2017   Procedure: ABDOMINAL AORTOGRAM W/LOWER EXTREMITY;  Surgeon: Waynetta Sandy, MD;  Location: [place] CV LAB;  Service: Cardiovascular;  Laterality: N/A;  . ABDOMINAL HYSTERECTOMY    . AMPUTATION Right 09/29/2017   Procedure: DEBRIDEMENT TOE AMPUTATION RIGHT FIFTH TOE;  Surgeon: Angelia Mould, MD;  Location: [place];  Service: Vascular;  Laterality: Right;  . APPLICATION OF WOUND VAC Right 09/29/2017   Procedure: APPLICATION OF WOUND VAC ON RIGHT FOOT;  Surgeon: Angelia Mould,  MD;  Location: [place];  Service: Vascular;  Laterality: Right;  . CATARACT EXTRACTION W/PHACO Right 08/31/2017   Procedure: CATARACT EXTRACTION PHACO AND INTRAOCULAR LENS PLACEMENT RIGHT EYE;  Surgeon: Tonny Branch, MD;  Location: [facility];  Service: Ophthalmology;  Laterality: Right;  CDE: 13.27  . CATARACT EXTRACTION W/PHACO Left 09/14/2017   Procedure: CATARACT EXTRACTION PHACO AND INTRAOCULAR LENS PLACEMENT LEFT EYE;  Surgeon: Tonny Branch, MD;  Location: [facility];  Service: Ophthalmology;  Laterality: Left;  CDE:  13.02  . CORONARY ANGIOPLASTY    . DRESSING CHANGE UNDER ANESTHESIA  12/02/2011   Procedure: DRESSING CHANGE UNDER ANESTHESIA;  Surgeon: Carole Civil, MD;  Location: [facility];  Service: Orthopedics;  Laterality: Right;  . ENDARTERECTOMY FEMORAL Right 09/15/2012   Procedure: ENDARTERECTOMY FEMORAL;  Surgeon: Mal Misty, MD;  Location: [place];  Service: Vascular;  Laterality: Right;  . FASCIOTOMY  11/29/2011   Procedure: FASCIOTOMY;  Surgeon: Carole Civil, MD;  Location: [facility];  Service: Orthopedics;  Laterality: Right;  right thigh   . FEMORAL-POPLITEAL BYPASS GRAFT Right 09/15/2012   Procedure: BYPASS GRAFT FEMORAL-POPLITEAL ARTERY;  Surgeon: Mal Misty, MD;  Location: [place];  Service: Vascular;  Laterality: Right;  . FEMORAL-TIBIAL BYPASS GRAFT Right 03/11/2017   Procedure: RIGHT FEMORAL-TIBIAL BYPASS IN-SITU GREATER SAPHENOUS VEIN;  Surgeon: Waynetta Sandy, MD;  Location: [place];  Service: Vascular;  Laterality: Right;  . GROIN DEBRIDEMENT Right 12/10/2017   Procedure: GROIN DEBRIDEMENT EXPLORATION;  Surgeon: Waynetta Sandy, MD;  Location: [place];  Service: Vascular;  Laterality: Right;  . HIP PINNING,CANNULATED  11/19/2011   Procedure: CANNULATED HIP PINNING;  Surgeon: Sanjuana Kava, MD;  Location: [facility];  Service: Orthopedics;  Laterality: Right;  . LOWER EXTREMITY ANGIOGRAPHY Bilateral 03/06/2017   Procedure: Lower Extremity Angiography;  Surgeon: Elam Dutch, MD;  Location: [place] CV LAB;  Service: Cardiovascular;  Laterality: Bilateral;  . PATCH ANGIOPLASTY Right 09/15/2012   Procedure: PATCH ANGIOPLASTY;  Surgeon: Mal Misty, MD;  Location: [place];  Service: Vascular;  Laterality: Right;  . TUBAL LIGATION      FAMILY HISTORY: Family History  Problem Relation Age of Onset  . Heart failure Mother   . Heart disease Mother   . Stroke Father   . Heart disease Sister   . Diabetes Son   . Hypertension Sister   . Hypertension Sister   . Stroke  Sister     SOCIAL HISTORY:  reports that she has never smoked. She has never used smokeless tobacco. She reports that she does not drink alcohol or use drugs.  PERFORMANCE STATUS: The patient's performance status is 3 - Symptomatic, >50% confined to bed  PHYSICAL EXAM: Most Recent Vital Signs: Blood pressure 111/68, pulse (!) 112, temperature 97.8 F (36.6 C), temperature source Oral, resp. rate (!) 24, height 6' (1.829 m), weight 162 lb 7.7 oz (73.7 kg), SpO2 99 %. BP 111/68   Pulse (!) 112   Temp 97.8 F (36.6 C) (Oral)   Resp (!) 24   Ht 6' (1.829 m)   Wt 162 lb 7.7 oz (73.7 kg)   LMP  (LMP Unknown)   SpO2 99%   BMI 22.04 kg/m  Neck: no adenopathy, supple, symmetrical, trachea midline and thyroid not enlarged, symmetric, no tenderness/mass/nodules Lungs: clear to auscultation bilaterally and Decreased breath sounds at bilateral bases right more than left. Heart: regular rate and rhythm Abdomen: soft, non-tender; bowel sounds normal; no masses,  no organomegaly Extremities: extremities  normal, atraumatic, no cyanosis or edema Skin: Skin color, texture, turgor normal. No rashes or lesions Lymph nodes: Cervical, supraclavicular, and axillary nodes normal. Neurologic: Grossly normal  LABORATORY DATA:  Results for orders placed or performed during the hospital encounter of 01/03/2019 (from the past 48 hour(s))  Blood gas, arterial     Status: Abnormal   Collection Time: 01/19/2019  6:36 PM  Result Value Ref Range   FIO2 40.00    O2 Content 40.0 L/min   pH, Arterial 7.409 7.350 - 7.450   pCO2 arterial 30.5 (L) 32.0 - 48.0 mmHg   pO2, Arterial 54.5 (L) 83.0 - 108.0 mmHg   Bicarbonate 20.5 20.0 - 28.0 mmol/L   Acid-base deficit 4.9 (H) 0.0 - 2.0 mmol/L   O2 Saturation 87.2 %   Patient temperature 37.0    Allens test (pass/fail) NOT INDICATED (A) PASS    Comment: Performed at [facility], [address], [place], [place] [address]  MRSA PCR Screening     Status: None    Collection Time: 01/11/2019  7:57 PM  Result Value Ref Range   MRSA by PCR NEGATIVE NEGATIVE    Comment:        The GeneXpert MRSA Assay (FDA approved for NASAL specimens only), is one component of a comprehensive MRSA colonization surveillance program. It is not intended to diagnose MRSA infection nor to guide or monitor treatment for MRSA infections. Performed at [facility], [address] [place], [place] [address]   Urinalysis, Routine w reflex microscopic     Status: Abnormal   Collection Time: 01/28/2019  8:00 PM  Result Value Ref Range   Color, Urine YELLOW YELLOW   APPearance CLEAR CLEAR   Specific Gravity, Urine 1.014 1.005 - 1.030   pH 5.0 5.0 - 8.0   Glucose, UA NEGATIVE NEGATIVE mg/dL   Hgb urine dipstick NEGATIVE NEGATIVE   Bilirubin Urine NEGATIVE NEGATIVE   Ketones, ur NEGATIVE NEGATIVE mg/dL   Protein, ur NEGATIVE NEGATIVE mg/dL   Nitrite NEGATIVE NEGATIVE   Leukocytes,Ua SMALL (A) NEGATIVE   RBC / HPF 0-5 0 - 5 RBC/hpf   WBC, UA 0-5 0 - 5 WBC/hpf   Bacteria, UA RARE (A) NONE SEEN   Squamous Epithelial / LPF 0-5 0 - 5    Comment: Performed at [facility], [address], [place], [place] [address]  Troponin I - Now Then Q6H     Status: None   Collection Time: 01/05/2019  9:37 PM  Result Value Ref Range   Troponin I <0.03 <0.03 ng/mL    Comment: Performed at [facility], [address], [place], [place] [address]  T4, free     Status: None   Collection Time: 01/07/2019  9:37 PM  Result Value Ref Range   Free T4 1.61 0.82 - 1.77 ng/dL    Comment: (NOTE) Biotin ingestion may interfere with free T4 tests. If the results are inconsistent with the TSH level, previous test results, or the clinical presentation, then consider biotin interference. If needed, order repeat testing after stopping biotin. Performed at [facility], [place] [address], [place], [place] [address]   Troponin I - Now Then Q6H     Status: None   Collection Time: 01/09/19  3:30 AM   Result Value Ref Range   Troponin I <0.03 <0.03 ng/mL    Comment: Performed at [facility], [address], [place],  [address]  CBC with Differential/Platelet     Status: Abnormal   Collection  Time: 01/09/19  3:31 AM  Result Value Ref Range   WBC 5.9 4.0 - 10.5 K/uL   RBC 2.05 (L) 3.87 - 5.11 MIL/uL   Hemoglobin 8.5 (L) 12.0 - 15.0 g/dL   HCT 27.2 (L) 36.0 - 46.0 %   MCV 132.7 (H) 80.0 - 100.0 fL   MCH 41.5 (H) 26.0 - 34.0 pg   MCHC 31.3 30.0 - 36.0 g/dL   RDW 20.7 (H) 11.5 - 15.5 %   Platelets 195 150 - 400 K/uL   nRBC 0.7 (H) 0.0 - 0.2 %   Neutrophils Relative % 79 %   Neutro Abs 4.6 1.7 - 7.7 K/uL   Lymphocytes Relative 8 %   Lymphs Abs 0.5 (L) 0.7 - 4.0 K/uL   Monocytes Relative 6 %   Monocytes Absolute 0.3 0.1 - 1.0 K/uL   Eosinophils Relative 3 %   Eosinophils Absolute 0.2 0.0 - 0.5 K/uL   Basophils Relative 2 %   Basophils Absolute 0.1 0.0 - 0.1 K/uL   Immature Granulocytes 2 %   Abs Immature Granulocytes 0.11 (H) 0.00 - 0.07 K/uL    Comment: Performed at [facility], [address], [place], [place] [address]  Comprehensive metabolic panel     Status: Abnormal   Collection Time: 01/09/19  3:31 AM  Result Value Ref Range   Sodium 136 135 - 145 mmol/L   Potassium 4.2 3.5 - 5.1 mmol/L   Chloride 108 98 - 111 mmol/L   CO2 21 (L) 22 - 32 mmol/L   Glucose, Bld 109 (H) 70 - 99 mg/dL   BUN 53 (H) 8 - 23 mg/dL   Creatinine, Ser 1.63 (H) 0.44 - 1.00 mg/dL   Calcium 8.3 (L) 8.9 - 10.3 mg/dL   Total Protein 6.0 (L) 6.5 - 8.1 g/dL   Albumin 3.3 (L) 3.5 - 5.0 g/dL   AST 13 (L) 15 - 41 U/L   ALT 9 0 - 44 U/L   Alkaline Phosphatase 65 38 - 126 U/L   Total Bilirubin 1.6 (H) 0.3 - 1.2 mg/dL   GFR calc non Af Amer 30 (L) >60 mL/min   GFR calc Af Amer 35 (L) >60 mL/min   Anion gap 7 5 - 15    Comment: Performed at [facility], [address], [place], [place] [address]  Magnesium     Status: None   Collection Time: 01/09/19  3:31 AM  Result Value Ref Range    Magnesium 2.3 1.7 - 2.4 mg/dL    Comment: Performed at [facility], [address] [place], [place] [address]  Phosphorus     Status: None   Collection Time: 01/09/19  3:31 AM  Result Value Ref Range   Phosphorus 4.2 2.5 - 4.6 mg/dL    Comment: Performed at [facility], [address] [place], [place] [address]  Hemoglobin A1c     Status: None   Collection Time: 01/09/19  3:31 AM  Result Value Ref Range   Hgb A1c MFr Bld 4.9 4.8 - 5.6 %    Comment: (NOTE) Pre diabetes:          5.7%-6.4% Diabetes:              >6.4% Glycemic control for   <7.0% adults with diabetes    Mean Plasma Glucose 93.93 mg/dL    Comment: Performed at [facility], [place] [address], [place], [place] [address]  Protime-INR     Status: Abnormal   Collection Time: 01/09/19  3:31 AM  Result Value Ref Range   Prothrombin Time 36.5 (H) 11.4 - 15.2 seconds   INR 3.8 (H) 0.8 - 1.2    Comment: (NOTE) INR goal varies based on device and disease states. Performed at [facility], [address] [place], [place] [address]   Glucose, capillary     Status: None   Collection Time: 01/09/19  7:38 AM  Result Value Ref Range   Glucose-Capillary 97 70 - 99 mg/dL  Comprehensive metabolic panel     Status: Abnormal   Collection Time: 01/09/19  7:33 PM  Result Value Ref Range   Sodium 136 135 - 145 mmol/L   Potassium 3.9 3.5 - 5.1 mmol/L   Chloride 104 98 - 111 mmol/L   CO2 20 (L) 22 - 32 mmol/L   Glucose, Bld 97 70 - 99 mg/dL   BUN 54 (H) 8 - 23 mg/dL   Creatinine, Ser 1.66 (H) 0.44 - 1.00 mg/dL   Calcium 8.4 (L) 8.9 - 10.3 mg/dL   Total Protein 6.3 (L) 6.5 - 8.1 g/dL   Albumin 3.4 (L) 3.5 - 5.0 g/dL   AST 13 (L) 15 - 41 U/L   ALT 9 0 - 44 U/L   Alkaline Phosphatase 64 38 - 126 U/L   Total Bilirubin 1.4 (H) 0.3 - 1.2 mg/dL   GFR calc non Af Amer 30 (L) >60 mL/min   GFR calc Af Amer 34 (L) >60 mL/min   Anion gap 12 5 - 15    Comment: Performed at [facility], [address] [place], [place] [address]  CBC  with Differential/Platelet     Status: Abnormal   Collection Time: 01/10/19  5:15 AM  Result Value Ref Range   WBC 5.4 4.0 - 10.5 K/uL   RBC 2.08 (L) 3.87 - 5.11 MIL/uL   Hemoglobin 8.5 (L) 12.0 - 15.0 g/dL   HCT 27.9 (L) 36.0 - 46.0 %   MCV 134.1 (H) 80.0 - 100.0 fL   MCH 40.9 (H) 26.0 - 34.0 pg   MCHC 30.5 30.0 - 36.0 g/dL   RDW 20.6 (H) 11.5 - 15.5 %   Platelets 207 150 - 400 K/uL   nRBC 1.1 (H) 0.0 - 0.2 %   Neutrophils Relative % 79 %   Neutro Abs 4.3 1.7 - 7.7 K/uL   Lymphocytes Relative 10 %   Lymphs Abs 0.6 (L) 0.7 - 4.0 K/uL   Monocytes Relative 5 %   Monocytes Absolute 0.3 0.1 - 1.0 K/uL   Eosinophils Relative 2 %   Eosinophils Absolute 0.1 0.0 - 0.5 K/uL   Basophils Relative 2 %   Basophils Absolute 0.1 0.0 - 0.1 K/uL   RBC Morphology MACROCYTOSIS PRESENT    Immature Granulocytes 2 %   Abs Immature Granulocytes 0.13 (H) 0.00 - 0.07 K/uL   Reactive, Benign Lymphocytes PRESENT    Polychromasia PRESENT     Comment: Performed at [facility], [address], [place], [place] [address]  Magnesium     Status: None   Collection Time: 01/10/19  5:15 AM  Result Value Ref Range   Magnesium 2.1 1.7 - 2.4 mg/dL    Comment: Performed at [facility], [address], [place], [place] [address]  Phosphorus     Status: None   Collection Time: 01/10/19  5:15 AM  Result Value Ref Range   Phosphorus 4.1 2.5 - 4.6 mg/dL    Comment: Performed at [facility], [address] [place], [place] [address]  Glucose, capillary     Status: None   Collection Time: 01/10/19  7:55 AM  Result Value Ref Range   Glucose-Capillary 90 70 - 99 mg/dL  Blood gas, arterial     Status: Abnormal   Collection Time: 01/10/19  8:35 AM  Result Value Ref Range   FIO2 48.00    pH, Arterial 7.428 7.350 - 7.450   pCO2 arterial 30.9 (L) 32.0 - 48.0 mmHg   pO2, Arterial 61.1 (L) 83.0 - 108.0 mmHg   Bicarbonate 21.6 20.0 - 28.0 mmol/L   Acid-base deficit 3.5 (H) 0.0 - 2.0 mmol/L   O2 Saturation 91.4 %    Patient temperature 36.6    Allens test (pass/fail) PASS PASS    Comment: Performed at [facility], [address], [place], [place] [address]  Comprehensive metabolic panel     Status: Abnormal   Collection Time: 01/10/19  8:39 AM  Result Value Ref Range   Sodium 135 135 - 145 mmol/L   Potassium 3.8 3.5 - 5.1 mmol/L   Chloride 105 98 - 111 mmol/L   CO2 21 (L) 22 - 32 mmol/L   Glucose, Bld 95 70 - 99 mg/dL   BUN 55 (H) 8 - 23 mg/dL   Creatinine, Ser 1.65 (H) 0.44 - 1.00 mg/dL   Calcium 8.4 (L) 8.9 - 10.3 mg/dL   Total Protein 5.8 (L) 6.5 - 8.1 g/dL   Albumin 3.1 (L) 3.5 - 5.0 g/dL   AST 12 (L) 15 - 41 U/L   ALT 7 0 - 44 U/L   Alkaline Phosphatase 60 38 - 126 U/L   Total Bilirubin 1.5 (H) 0.3 - 1.2 mg/dL   GFR calc non Af Amer 30 (L) >60 mL/min   GFR calc Af Amer 35 (L) >60 mL/min   Anion gap 9 5 - 15    Comment: Performed at [facility], [address], [place], [place] [address]  Protime-INR     Status: Abnormal   Collection Time: 01/10/19  8:39 AM  Result Value Ref Range   Prothrombin Time 31.3 (H) 11.4 - 15.2 seconds   INR 3.1 (H) 0.8 - 1.2    Comment: (NOTE) INR goal varies based on device and disease states. Performed at [facility], [address] [place], [place] [address]   Glucose, capillary     Status: None   Collection Time: 01/10/19 11:26 AM  Result Value Ref Range   Glucose-Capillary 75 70 - 99 mg/dL  Albumin, pleural or peritoneal fluid     Status: None   Collection Time: 01/10/19 12:42 PM  Result Value Ref Range   Albumin, Fluid 1.7 g/dL    Comment: (NOTE) No normal range established for this test Results should be evaluated in conjunction with serum values    Fluid Type-FALB Pleural Fld     Comment: Performed at [facility], [address] [place], [place] [address]  Body fluid cell count with differential     Status: Abnormal   Collection Time: 01/10/19 12:42 PM  Result Value Ref Range   Fluid Type-FCT PLEURAL    Color, Fluid YELLOW (A) YELLOW    Appearance, Fluid CLEAR CLEAR   WBC, Fluid 134 0 - 1,000 cu mm   Neutrophil Count, Fluid 91 (H) 0 - 25 %   Lymphs, Fluid 2 %   Monocyte-Macrophage-Serous Fluid 7 (L) 50 - 90 %   Eos, Fluid 0 %   Other Cells, Fluid 1 %    Comment: FEW MESOTHELIAL CELLS  Performed at [facility], [address] [place], [place] [address]   Glucose, pleural or peritoneal fluid     Status: None   Collection Time: 01/10/19 12:42 PM  Result Value Ref Range   Glucose, Fluid 93 mg/dL    Comment: (NOTE) No normal range established for this test Results should be evaluated in conjunction with serum values    Fluid Type-FGLU Pleural Fld     Comment: Performed at [facility], [address] [place], [place] [address]  Lactate dehydrogenase (pleural or peritoneal fluid)     Status: Abnormal   Collection Time: 01/10/19 12:42 PM  Result Value Ref Range   LD, Fluid 185 (H) 3 - 23 U/L    Comment: (NOTE) Results should be evaluated in conjunction with serum values    Fluid Type-FLDH Pleural Fld     Comment: Performed at [facility], [address] [place], [place] [address]  Protein, pleural or peritoneal fluid     Status: None   Collection Time: 01/10/19 12:42 PM  Result Value Ref Range   Total protein, fluid <3.0 g/dL   Fluid Type-FTP Pleural Fld     Comment: Performed at [facility], [address] [place], [place] [address]  Gram stain     Status: None   Collection Time: 01/10/19 12:43 PM  Result Value Ref Range   Specimen Description PLEURAL    Special Requests NONE    Gram Stain      CYTOSPIN SMEAR NO ORGANISMS SEEN WBC PRESENT,BOTH PMN AND MONONUCLEAR Performed at [facility], [address] [place], [place] [address]    Report Status 01/10/2019 FINAL       RADIOGRAPHY: Dg Chest Port 1 View  Result Date: 01/10/2019 CLINICAL DATA:  LEFT pleural effusion post thoracentesis EXAM: PORTABLE CHEST 1 VIEW COMPARISON:  Earlier study of 01/10/2019 at 0527 hours FINDINGS: Enlargement of cardiac  silhouette with pulmonary vascular congestion. Minimal pulmonary edema, slightly improved. Bibasilar effusions and atelectasis, decreased on LEFT since earlier exam post thoracentesis. Small LEFT apically pneumothorax time without mediastinal shift. Osseous structures unremarkable. IMPRESSION: Small LEFT apical pneumothorax post thoracentesis. Findings called to Dr. Alfredia Ferguson on 01/10/2019 at 1420 hours. Follow-up chest radiograph will be ordered for 1800 hours this evening. Electronically Signed   By: Lavonia Dana M.D.   On: 01/10/2019 14:22   Dg Chest Port 1 View  Result Date: 01/10/2019 CLINICAL DATA:  Shortness of breath EXAM: PORTABLE CHEST 1 VIEW COMPARISON:  01/09/2019 FINDINGS: Cardiac shadow is stable. Bilateral pleural effusions and bibasilar infiltrates are again seen and stable. No new focal abnormality is noted. IMPRESSION: Stable pleural effusions and basilar airspace opacities. Given some technical variations in the film the overall appearance is stable. Electronically Signed   By: Inez Catalina M.D.   On: 01/10/2019 07:53   Dg Chest Port 1 View  Result Date: 01/09/2019 CLINICAL DATA:  Shortness of breath. EXAM: PORTABLE CHEST 1 VIEW COMPARISON:  01/15/2019 FINDINGS: Worsening bilateral airspace disease particularly in the left lung. Again noted are moderate sized bilateral pleural effusions. The heart appears to be enlarged. Negative for pneumothorax. IMPRESSION: Increased bilateral airspace disease is suggestive for pulmonary edema. Persistent bilateral pleural effusions. Electronically Signed   By: Markus Daft M.D.   On: 01/09/2019 09:15   US Thoracentesis Asp Pleural Space W/img Guide  Result Date: 01/10/2019 INDICATION: LEFT pleural effusion EXAM: ULTRASOUND GUIDED DIAGNOSTIC AND THERAPEUTIC LEFT THORACENTESIS MEDICATIONS: None. COMPLICATIONS: None immediate. PROCEDURE: Procedure, benefits, and risks of procedure were discussed with  patient. Written informed consent for procedure was obtained.  Time out protocol followed. Pleural effusion localized by ultrasound at the posterior LEFT hemithorax. Skin prepped and draped in usual sterile fashion. Skin and soft tissues anesthetized with 8 mL of 1% lidocaine. 8 French thoracentesis catheter placed into the LEFT pleural space. 1.2 L of yellow LEFT pleural fluid aspirated by syringe pump. Procedure tolerated well by patient without immediate complication. FINDINGS: A total of approximately 1.2 L of LEFT pleural fluid was removed. Samples were sent to the laboratory as requested by the clinical team. IMPRESSION: Successful ultrasound guided LEFT thoracentesis yielding 1.2 L of pleural fluid. Electronically Signed   By: Lavonia Dana M.D.   On: 01/10/2019 14:15       ASSESSMENT and PLAN:  1.  Macrocytic anemia: - Hemoglobin on presentation was 9.2 with MCV of 132.  Prior hemoglobin in December 2019 was 15.1 with a normal MCV. - Folic acid and G38 were normal.  Ferritin was 161 with percent saturation of 15. - There was a question of schistocytes on the earlier smear.  LDH was elevated at 629.  BNP was also elevated at 874. - Haptoglobin was normal at 124.  Reticulocyte count is also normal at 2.2%. -Platelet count was normal which goes against TTP. - Macrocytic anemia could be from toxicity from hydroxyurea.  However hydroxyurea is on hold since she was admitted. -Was evaluated by GI, thought any procedures were not required at this time.  FOBT was reportedly positive during admission. - We will closely monitor her hemoglobin.  We will check daily LDH, reticulocyte count.  Would also recommend checking indirect bilirubin.  2.  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 -This is currently on hold.  3.  Acute on chronic respiratory failure: - CT chest without contrast on 01/09/2019 shows CHF with bilateral pleural effusions with significant atelectasis at both lower lobes. -He underwent left thoracentesis,  1.2 L of yellow fluid removed. -Patient on nasal cannula.  She is afebrile with no leukocytosis and no concern for pneumonia. -This is mostly from diastolic CHF.  4.  Chronic A. fib with RVR: -He presented with rapid A. fib and RVR. - She is now on Cardizem and amiodarone.  All questions were answered. The patient knows to call the clinic with any problems, questions or concerns. We can certainly see the patient much sooner if necessary.    Derek Jack

## 2019-01-10 NOTE — Evaluation (Signed)
Physical Therapy Evaluation Patient Details Name: Charlotte [name] MRN: [id number] DOB: [birth year] Today's Date: 01/10/2019   History of Present Illness  Charlotte [name] is a 78 y.o. female with medical history significant for but not limited to chronic atrial fibrillation on anticoagulation with Coumadin, history of right leg cellulitis, history of combined CHF, history of COPD chronically on 4 L of home O2, anxiety and depression, essential hypertension, history of DVT, hyperlipidemia, hypothyroidism, history of iron deficiency anemia due to chronic blood loss, deafness and extremely hard of hearing, peripheral neuropathy, peripheral vascular disease, polycythemia vera, and other multiple medical comorbidities who presents with a 3 to 4-day history of dyspnea is progressively gotten worse    Clinical Impression  Patient presents supine in bed and is agreeable to therapy. Patient below baseline for functional mobility and gait, and limited secondary to generalized weakness and fatigue with activity. Patient requires min assist for supine to/from sit transfers demonstrating slow labored movement requiring assistance with trunk uprighting when rising to sit and BLE assistance when returning to lying. Patient requires min assist for sit to/from stand transfers using RW demonstrating slow labored movement. Pt unable to take steps at bedside secondary to desating with 7LPM O2 and HR elevated over 120 bpm with mobility. Pt's O2 sat varied from 78-95% with 7 LPM O2 throughout treatment session, pt denies dizziness and mild shortness of breath noted. Pt requires seated rest breaks between activities due to fatiguing easily. Patient left in bed, bed alarm on, RN in room with call bell in reach. Patient will benefit from continued physical therapy in hospital and recommended venue below to increase strength, balance, endurance for safe ADLs and gait.     Follow Up Recommendations SNF    Equipment  Recommendations  None recommended by PT    Recommendations for Other Services       Precautions / Restrictions Precautions Precautions: Fall Precaution Comments: Monitor HR, PVC, and oxygen stats Restrictions Weight Bearing Restrictions: No      Mobility  Bed Mobility Overal bed mobility: Needs Assistance Bed Mobility: Supine to Sit;Sit to Supine     Supine to sit: Min assist Sit to supine: Min assist   General bed mobility comments: turnk uprighting assistance for supine to sit, BLE lifting assistance for sit to supine, increased time  Transfers Overall transfer level: Needs assistance Equipment used: Rolling walker (2 wheeled) Transfers: Sit to/from Stand Sit to Stand: Min assist         General transfer comment: increased time, labored movement  Ambulation/Gait             General Gait Details: unable due to fatigue, O2 sat and HR  Stairs            Wheelchair Mobility    Modified Rankin (Stroke Patients Only)       Balance Overall balance assessment: Needs assistance Sitting-balance support: Feet supported;Bilateral upper extremity supported Sitting balance-Leahy Scale: Fair Sitting balance - Comments: seated EOB   Standing balance support: During functional activity;Bilateral upper extremity supported Standing balance-Leahy Scale: Fair Standing balance comment: static standing with RW                             Pertinent Vitals/Pain Pain Assessment: No/denies pain    Home Living Family/patient expects to be discharged to:: Private residence Living Arrangements: Spouse/significant other;Children(son) Available Help at Discharge: Family;Available 24 hours/day Type of Home: Mobile home Home Access: Stairs to enter  Entrance Stairs-Rails: Left Entrance Stairs-Number of Steps: 2 Home Layout: One level Home Equipment: Walker - 2 wheels;Bedside commode;Wheelchair - manual;Tub bench;Cane - single point;Other (comment)(home  O2) Additional Comments: Patient uses the BSC, sponge bathes, 4LPM O2    Prior Function Level of Independence: Needs assistance   Gait / Transfers Assistance Needed: Pt uses RW in the home, unable to ambualte community distances due to SOB, with 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  ADL's / Homemaking Assistance Needed: Pt reports Ind with bathig and dressing, family and son provide transportation, cleaning, and cooking        Hand Dominance   Dominant Hand: Right    Extremity/Trunk Assessment   Upper Extremity Assessment Upper Extremity Assessment: Defer to OT evaluation    Lower Extremity Assessment Lower Extremity Assessment: Generalized weakness    Cervical / Trunk Assessment Cervical / Trunk Assessment: Normal  Communication   Communication: HOH  Cognition Arousal/Alertness: Awake/alert Behavior During Therapy: WFL for tasks assessed/performed Overall Cognitive Status: Within Functional Limits for tasks assessed                                        General Comments      Exercises     Assessment/Plan    PT Assessment Patient needs continued PT services  PT Problem List Decreased strength;Decreased activity tolerance;Decreased balance;Decreased mobility       PT Treatment Interventions DME instruction;Gait training;Functional mobility training;Therapeutic activities;Therapeutic exercise;Balance training;Patient/family education    PT Goals (Current goals can be found in the Care Plan section)  Acute Rehab PT Goals Patient Stated Goal: return home PT Goal Formulation: With patient Time For Goal Achievement: 01/24/19    Frequency Min 3X/week   Barriers to discharge        Co-evaluation   Reason for Co-Treatment: To address functional/ADL transfers   OT goals addressed during session: ADL's and self-care;Strengthening/ROM       AM-PAC PT "6 Clicks" Mobility  Outcome Measure Help needed turning from your back to  your side while in a flat bed without using bedrails?: A Little Help needed moving from lying on your back to sitting on the side of a flat bed without using bedrails?: A Little Help needed moving to and from a bed to a chair (including a wheelchair)?: A Lot Help needed standing up from a chair using your arms (e.g., wheelchair or bedside chair)?: A Little Help needed to walk in hospital room?: Total Help needed climbing 3-5 steps with a railing? : Total 6 Click Score: 13    End of Session Equipment Utilized During Treatment: Gait belt;Oxygen(7 LPM) Activity Tolerance: Patient tolerated treatment well;Patient limited by fatigue Patient left: in bed;with call bell/phone within reach Nurse Communication: Mobility status PT Visit Diagnosis: Unsteadiness on feet (R26.81);Other abnormalities of gait and mobility (R26.89);Muscle weakness (generalized) (M62.81)    Time: 8119-1478 PT Time Calculation (min) (ACUTE ONLY): 41 min   Charges:   PT Evaluation $PT Eval Moderate Complexity: 1 Mod PT Treatments $Therapeutic Activity: 23-37 mins        1:17 PM, 01/10/19 Talbot Grumbling, DPT Physical Therapist with [facility] [phone] office

## 2019-01-10 NOTE — Progress Notes (Addendum)
Have increased Oxygen to 10 lpm high flow. X-ray showing worsening, pleural effusions- edema. I/O slightly ahead - increased KG wt. Lasix given bid. Saturation has dropped slightly. Heart still having problems afib.

## 2019-01-11 ENCOUNTER — Inpatient Hospital Stay (HOSPITAL_COMMUNITY): Payer: Medicare HMO

## 2019-01-11 DIAGNOSIS — I519 Heart disease, unspecified: Secondary | ICD-10-CM

## 2019-01-11 DIAGNOSIS — N19 Unspecified kidney failure: Secondary | ICD-10-CM

## 2019-01-11 DIAGNOSIS — I5033 Acute on chronic diastolic (congestive) heart failure: Secondary | ICD-10-CM

## 2019-01-11 DIAGNOSIS — N179 Acute kidney failure, unspecified: Secondary | ICD-10-CM

## 2019-01-11 LAB — PROTIME-INR
INR: 3.1 — ABNORMAL HIGH (ref 0.8–1.2)
Prothrombin Time: 31.6 seconds — ABNORMAL HIGH (ref 11.4–15.2)

## 2019-01-11 LAB — CBC WITH DIFFERENTIAL/PLATELET
Abs Immature Granulocytes: 0.11 10*3/uL — ABNORMAL HIGH (ref 0.00–0.07)
Basophils Absolute: 0.1 10*3/uL (ref 0.0–0.1)
Basophils Relative: 1 %
Eosinophils Absolute: 0.1 10*3/uL (ref 0.0–0.5)
Eosinophils Relative: 2 %
HCT: 26.2 % — ABNORMAL LOW (ref 36.0–46.0)
Hemoglobin: 8.2 g/dL — ABNORMAL LOW (ref 12.0–15.0)
Immature Granulocytes: 2 %
Lymphocytes Relative: 6 %
Lymphs Abs: 0.4 10*3/uL — ABNORMAL LOW (ref 0.7–4.0)
MCH: 40.8 pg — ABNORMAL HIGH (ref 26.0–34.0)
MCHC: 31.3 g/dL (ref 30.0–36.0)
MCV: 130.3 fL — ABNORMAL HIGH (ref 80.0–100.0)
Monocytes Absolute: 0.4 10*3/uL (ref 0.1–1.0)
Monocytes Relative: 7 %
Neutro Abs: 4.8 10*3/uL (ref 1.7–7.7)
Neutrophils Relative %: 82 %
Platelets: 252 10*3/uL (ref 150–400)
RBC: 2.01 MIL/uL — ABNORMAL LOW (ref 3.87–5.11)
RDW: 21.2 % — ABNORMAL HIGH (ref 11.5–15.5)
WBC: 5.6 10*3/uL (ref 4.0–10.5)
nRBC: 0.5 % — ABNORMAL HIGH (ref 0.0–0.2)

## 2019-01-11 LAB — COMPREHENSIVE METABOLIC PANEL
ALT: 8 U/L (ref 0–44)
AST: 11 U/L — ABNORMAL LOW (ref 15–41)
Albumin: 2.9 g/dL — ABNORMAL LOW (ref 3.5–5.0)
Alkaline Phosphatase: 54 U/L (ref 38–126)
Anion gap: 11 (ref 5–15)
BUN: 59 mg/dL — ABNORMAL HIGH (ref 8–23)
CO2: 19 mmol/L — ABNORMAL LOW (ref 22–32)
Calcium: 8.2 mg/dL — ABNORMAL LOW (ref 8.9–10.3)
Chloride: 106 mmol/L (ref 98–111)
Creatinine, Ser: 1.8 mg/dL — ABNORMAL HIGH (ref 0.44–1.00)
GFR calc Af Amer: 31 mL/min — ABNORMAL LOW (ref >60)
GFR calc non Af Amer: 27 mL/min — ABNORMAL LOW (ref >60)
Glucose, Bld: 74 mg/dL (ref 70–99)
Potassium: 4.3 mmol/L (ref 3.5–5.1)
Sodium: 136 mmol/L (ref 135–145)
Total Bilirubin: 1.5 mg/dL — ABNORMAL HIGH (ref 0.3–1.2)
Total Protein: 5.6 g/dL — ABNORMAL LOW (ref 6.5–8.1)

## 2019-01-11 LAB — RETICULOCYTES
Immature Retic Fract: 36.3 % — ABNORMAL HIGH (ref 2.3–15.9)
RBC.: 2.04 MIL/uL — ABNORMAL LOW (ref 3.87–5.11)
Retic Count, Absolute: 57.3 10*3/uL (ref 19.0–186.0)
Retic Ct Pct: 2.8 % (ref 0.4–3.1)

## 2019-01-11 LAB — PATHOLOGIST SMEAR REVIEW

## 2019-01-11 LAB — PH, BODY FLUID: pH, Body Fluid: 7.5

## 2019-01-11 LAB — GLUCOSE, CAPILLARY: Glucose-Capillary: 73 mg/dL (ref 70–99)

## 2019-01-11 LAB — LACTATE DEHYDROGENASE: LDH: 628 U/L — ABNORMAL HIGH (ref 98–192)

## 2019-01-11 LAB — PHOSPHORUS: Phosphorus: 4.9 mg/dL — ABNORMAL HIGH (ref 2.5–4.6)

## 2019-01-11 LAB — MAGNESIUM: Magnesium: 2.2 mg/dL (ref 1.7–2.4)

## 2019-01-11 MED ORDER — FUROSEMIDE 10 MG/ML IJ SOLN
40.0000 mg | Freq: Two times a day (BID) | INTRAMUSCULAR | Status: DC
  Administered 2019-01-11 – 2019-01-12 (×2): 40 mg via INTRAVENOUS
  Filled 2019-01-11 (×2): qty 4

## 2019-01-11 MED ORDER — WARFARIN - PHARMACIST DOSING INPATIENT: Freq: Every day | Status: DC

## 2019-01-11 NOTE — Progress Notes (Addendum)
ANTICOAGULATION CONSULT NOTE -   Pharmacy Consult for warfarin Indication: atrial fibrillation  No Known Allergies  Patient Measurements: Height: 6' (182.9 cm) Weight: 162 lb 0.6 oz (73.5 kg) IBW/kg (Calculated) : 73.1   Vital Signs: Temp: 98.2 F (36.8 C) (06/09 1140) Temp Source: Oral (06/09 1140) BP: 98/58 (06/09 1000) Pulse Rate: 113 (06/09 1000)  Labs: Recent Labs    01/16/2019 2137 01/09/19 0330  01/09/19 0331 01/09/19 1933 01/10/19 0515 01/10/19 0839 01/11/19 0555 01/11/19 1151  HGB  --   --    < > 8.5*  --  8.5*  --  8.2*  --   HCT  --   --   --  27.2*  --  27.9*  --  26.2*  --   PLT  --   --   --  195  --  207  --  252  --   LABPROT  --   --   --  36.5*  --   --  31.3*  --  31.6*  INR  --   --   --  3.8*  --   --  3.1*  --  3.1*  CREATININE  --   --    < > 1.63* 1.66*  --  1.65* 1.80*  --   TROPONINI <0.03 <0.03  --   --   --   --   --   --   --    < > = values in this interval not displayed.    Estimated Creatinine Clearance: 30.7 mL/min (A) (by C-G formula based on SCr of 1.8 mg/dL (H)).  Assessment: Pharmacy consulted to dose warfarin for this 78 yo female with atrial fibrillation on chronic anti-coagulation with warfarin.  Her home dose of warfarin is 2.5mg  on Sun/Tues/Thurs  and 5mg  on Mon/Wed/Fri/Sat.  Today, her INR remains at 3.1---> probably not due to the interaction with amiodarone quite  this early.  Goal of Therapy:  INR 2-3 Monitor platelets by anticoagulation protocol: Yes   Plan:  Hold warfarin today. Monitor daily INR and s/s of bleeding.  Despina Pole, Pharm. D. Clinical Pharmacist 01/11/2019 3:07 PM

## 2019-01-11 NOTE — Progress Notes (Signed)
PROGRESS NOTE    Charlotte [name]  ZCH:[id number] DOB: [birth year] DOA: 01/29/2019 PCP: [name], MD   Brief Narrative:  HPI: Charlotte [name] is a 78 y.o. 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  States she has not been around any sick contacts and denies any chest pain, lightheadedness or dizziness but states that her breathing is significantly worsened and she currently does wear 4 L oxygen.  He is not having any cough or productive sputum and denies any fevers.  States that she is also been feeling weaker and a little bit more tired.  She was worked up and found to be in atrial fibrillation RVR and was given a small 500 mL bolus with improvement in her heart rate.  Patient still remained dyspneic and oxygen requirement had to be escalated to 6 L. TRH was called admit this patient for her acute on chronic hypoxic respiratory failure and dyspnea and is also found that she had a worsening anemia and was found to be FOBT positive so GI was consulted and will see the patient in the a.m.  ED Course: She had basic blood work done including CBC, CMP, had a chest x-ray done EKG and given a 500 mL bolus.  She was given 40 mg of IV Protonix and gastroenterology Dr. Gala Romney was consulted and he will see the patient tomorrow for her anemia and FOBT positive.  **Interim History CT of the chest without contrast was done and showed congestive  heart failure with large bilateral pleural effusions with significant atelectasis on both lower lobes and mild interstitial pulmonary edema along with aortic atherosclerosis.  IV fluids were discontinued and patient was started on diuresis.  She continues to be on the Cardizem drip at 5 and we will attempt to titrate to 7.5 mg/hr.  Echocardiogram was done and showed a normal EF however diastolic dysfunction could not be assessed because of her A. fib and right ventricle did show severely reduced systolic function.  I spoke with Cardiology Dr. Domenic Polite who evaluated and recommended continuing Diuresis and added IV Amiodarone for Rhythm Control .  GI has been evaluating the patient and feel no indication for endoscopy or colonoscopy given her tenuous status and supratherapeutic INR.  Radiology thoracentesis done today on the Left Side and removed 1200 mL Fluid however patient had a resultant small Pneumothorax. Hematology evaluated for her macrocytic anemia and recommends continuing to monitor hemoglobin closely along with daily LDH and reticulocyte count checks.  Currently continue to hold hydroxyurea for her polycythemia vera.  Assessment & Plan:   Active Problems:   Polycythemia vera (HCC)   Chronic atrial fibrillation   Anticoagulant long-term use   HTN (hypertension)   Hypothyroidism   PVD (peripheral vascular disease) (HCC)   SOB (shortness of breath)   Hypoxemia   CHF (congestive heart failure) (HCC)   Acute on chronic respiratory failure with hypoxia (HCC)   Anemia   Chronic respiratory failure with hypoxia ([place])   Hyperlipidemia   Acute renal failure (ARF) ([place])  GERD (gastroesophageal reflux disease)   Iron deficiency anemia due to chronic blood loss   Atrial fibrillation with RVR (HCC)  Dyspnea/Acute on Chronic Respiratory Failure with Hypoxia -Multifactorial and likely in the setting of symptomatic anemia from suspected GI bleed, atrial fibrillation with RVR, and Hx of COPD along  with now CHF and bilateral pleural effusions -FOBT was positive in the ED and will continue monitor hemoglobin/hematocrit and have gastroenterology weigh in -We will place on a Cardizem drip and placed in stepdown unit and control heart rates improved; cardiology has now added IV amiodarone for better rhythm control and they recommend continuing it for now  -Checked echocardiogram as below -We will place on Xopenex/Atrovent every 6 scheduled for breathing difficulties -Currently holding Coumadin INR remains supratherapeutic still at 3.8 -Will obtain CT of the Chest w/o Contrast to rule out other etiologies; CT scan showed CHF with bilateral pleural effusions and aortic atherosclerosis -Admission chest x-ray showed slightly enlarged central vascular structures compared to the previous examination concerning for mild edema there is persistent bibasilar chest densities are most compatible with moderate sized pleural effusion with compressive atelectasis or airspace disease and probable cardiomegaly -Today's chest x-ray showed "Cardiac shadow is stable. Bilateral pleural effusions and bibasilar infiltrates are again seen and stable. No new focal abnormality is noted." -Contacted Radiology for Thoracentesis and Left side was done today and drained 1.2 Liters and subsequently she had a small Apical Pnuemothorax post Thoracentesis  -Currently patient wears 6 Liters of Supplemental o2 via [place] and Home Dose is 4 Liters  -Currently holding her Torsemide given lower BP; IV fluids have now been stopped and diuresis has been starting -She is afebrile has no leukocytosis and is less concern for pneumonia -SARS-CoV-2 is negative -Continue Diuresis with IV Lasix; Increased dose to 60 mg BID and Cardiology recommending decreasing to  40 mg BID given bump in Cr  -ABG this morning showed pH of 7.428, PCO2 of 30.9, PO2 61.1, bicarbonate level 21.6, and O2 saturation of 91.4% on 48.00 FiO2  Hypothyroidism -Patient's  TSH was 0.141 and low -Checked Free T4 and was within normal limits of 1.61 -Continue home Levothyroxine of 125 mcg p.o. daily before breakfast  Hyperlipidemia -Continue with Pravastatin 10 mg p.o. nightly  Acute Diastolic CHF and Severe Right Sided Ventricle Dysfuntion in a patient with Chronic Combined CHF -Appeared slightly volume overloaded and had lower extremity edema but patient states this is chronic setting of her recent surgeries and bypass with the right being worse than left -Chest x-ray showed slightly enlarged central vascular structures compared to the previous examination concerning for mild edema there is persistent bibasilar chest densities are most compatible with moderate sized pleural effusion with compressive atelectasis or airspace disease and probable cardiomegaly -EF is 60 to 65% in 2018 with indeterminate diastolic function due to atrial fibrillation and then D-shaped intraventricular septum was suggestive of right ventricular pressure/volume overload.  The left atrium was also severely dilated and the right ventricle systolic function was moderately reduced was that the patient had normal left ventricular size with an EF of 65% and she had severe biatrial enlargement and moderate pulmonary hypertension -Repeat Echocardiogram this visit as below and showed EF of 60-65% -Checked BNP and was 874.0 and Fluid Restrict to 1200 mL -Strict I's and O's and daily weights; I's/O's not likely being done accurately as patient is +662 mL since Admission and Weight is Up 6 lbs since admission? -Continue to monitor for signs and symptoms of volume overload -IVF  has now Discontinued and Diuresis has started and increased to 60 mg BID yesterday but reduced to 40 mg IV BID -CT Chest showed congestive heart failure with large bilateral pleural effusions with significant atelectasis of both lobes and mild interstitial pulmonary edema.  There is also noted to aortic atherosclerosis -Continue  to Monitor Respiratory Status and Fluid Status Carefully  Bilateral Pleural Effusions -Ordered thoracentesis and Left side was done and drained 1.2 Liters and subsequently had a Pneumothorax post procedure  -Continue with IV Lasix as Blood pressure permits -Continue monitor strict I's and O's and daily weights -Repeat CXR showed "Stable to slightly diminished small left apically pneumothorax. Stable moderate to large right pleural effusion and small left pleural effusion." -Continue Diuresis as above  Pneumothorax -Small Apical Pneumothorax on the Left post-procedure -Seems to be improving -Will continue to Monitor Closely   GERD -Given IV Pantoprazole 40 mg Once and will continue po 40 mg daily   Chronic Atrial Fibrillation now in RVR -Patient presented with rapid A. fib and RVR and was given a 500 mL bolus with improvement in heart rate -Cardizem was ordered but never initiated given her low blood pressures will now be initiated; Currently on 5 mg/hr  -Hold all antihypertensives but will resume home Cardizem of 240 mg daily when she is off of Cardizem drip -Admitted into the stepdown unit and continue IV fluid hydration with normal saline at 50 mL's stopped and now Diuresed  -Initial cardiac troponin was less than 0.03 will continue to monitor and trend -Hold Coumadin at this time given supratherapeutic INR and is now 3.1 -Continue to monitor on telemetry  -Cardiology was consulted and they recommended adding IV Amiodarone and will continue for now   Lactic Acidosis -Was mildly elevated at 2.2 is now trended down to 1.1 -Gentle IV fluid hydration with normal saline rate of 50 mL stopped yesterday and diuresis was started given her significant volume overload  Peripheral Vascular Disease -Recently saw Dr. [name] and had a amputation of the fifth pinky toe -Currently holding aspirin 81 mg given FOBT positive and supratherapeutic INR for now and likely add back in AM   Acute  Kidney Injury on CKD Stage 3, slightly worsened in the setting of Diuresis  Metabolic Acidosis  -Patient's Baseline Cr appears to be from 0.8-1.1 -BUN/Cr on Admission was 53/1.76 questionable if this is from blood loss but likely this is now in the setting of volume overload from atrial fibrillation with RVR and CHF; BUN/creatinine slightly improved but then again worsened today at 59/1.80  -Avoid Nephrotoxic Medications, Contrast Dyes, and Hypotension -Given IVF Hydration with NS 500 mL in the ED and was started on gentle IV fluid hydration at 50 mL's per hour but then discontinued and started on diuresis -Started IV Lasix 40 mg twice daily and increased to 60 mg BID but will cut back to Lasix 40 mg BID per Cardiology Recc's   -CO2 is now 4 and AG is 11 -Currently Holding Torsemide given low blood pressures and AKI -Continue to Monitor and Trend Renal Fxn -Repeat CBC in AM   Macrocytic Anemia/?Chronic Blood Loss Anemia/ Multifactorial Anemia including ?Hemolysis History of Polycythemia Vera on Hydroxyurea; Currently being held  -Patient's Hb/Hct on Admission was 9.3/29.4 on Admission and is further drop down now to 8.2/26.2 -In December Hb was 15.1 but has ranges previously from Mid 8-10's -Has a documented Hx of Anemia of Chronic Blood Loss -FOBT was Positive and she has a Supratherapeutic INR; Schistoyctes were  seen on Peripheral Smear but we will be checking an LDH, haptoglobin, reticulocyte count as well as an anemia panel -I discussed the case with hematology Dr. Delton Coombes who feels that the patient can have a slight schistocytosis in the setting of renal insufficiency -Will Check LDH and was 629, Haptoglobin is 124, -Reticulocyte count done and Retic Ct Pct ws 2.22, Absolute Retic Ct was 49.3, and Immature Retic Fract was 32.6 -Anemia panel done and showed an iron level of 34, U IBC 186, TIBC of 220, saturation ratios of 15%, ferritin level 161, folate level 12.7, and vitamin B12 level  of 714 -DIC panel done and showed a d-dimer of 0.28, fibrinogen level of 366, PT of 35.7, INR 3.7, APTT of 80, -Hold Hydroxyurea at the recommendation of hematology -Given IV Protonix 40 mg in the ED; Hold Coumadin for now   -Will get GI opinion and Appreciate Dr. Roseanne Kaufman evaluation; Dr. Gala Romney does not feel that the patient is ever going to be a candidate for endoscopic evaluation given her significant comorbidities and feels that the hematologist stool was in the setting of coagulopathy and antiplatelet therapy with aspirin and could be due to any number of etiologies throughout the GI tract currently she is devoid of any GI symptoms and Dr. Gala Romney recommends following clinically and if Coumadin is to be continued she would need better control of her INR and he states that if aspirin is to be continued then she would need daily acid suppression therapy for cytoprotective effect with a PPI -Hematology following and recommending following LDH and Reticulocyte Count Daily   Hyperbilirubinemia -Paitent's T Bili was 1.4 and then repeated was 1.2 with a direct bilirubin of 0.4 and an indirect of 0.8; NOW T bili is 1.5 -Likley in the setting of ? Hemolysis but could also be from Hydroxyurea -I discussed the case with medical oncology and hematology Dr. Delton Coombes who recommends holding the hydroxyurea at this time and recommends trending the bilirubin and getting a direct and indirect bilirubin which was done above -Gastroenterology recommending not evaluating bilirubin further unless it rises above the 3-4 range and or if the transaminases bump -Continue to Monitor and Trend -Repeat CMP in AM   DVT prophylaxis: SCDs; Anticoagulated with Coumadin which is currently being held  Code Status: DO NOT RESUSCITATE  Family Communication: No family present at bedside  Disposition Plan: Remain in the SDU as she remains on IV Amiodarone and IV Diltiazem   Consultants:    Gastroenterology  Hematology  Cardiology  Radiology    Procedures:  ECHOCARDIOGRAM on 01/09/2019 IMPRESSIONS    1. The left ventricle has normal systolic function with an ejection fraction of 60-65%. The cavity size was normal. Left ventricular diastolic function could not be evaluated secondary to atrial fibrillation.  2. The right ventricle has severely reduced systolic function. The cavity was moderately enlarged. There is no increase in right ventricular wall thickness. Right ventricular systolic pressure is moderately elevated with an estimated pressure of 49.1  mmHg.  3. Left atrial size was severely dilated.  4. Right atrial size was severely dilated.  5. Large pleural effusion.  6. Trivial pericardial effusion is present.  7. No evidence of mitral valve stenosis.  8. No stenosis of the aortic valve.  9. The aortic root and ascending aorta are normal in size and structure. 10. The inferior vena cava was normal in size with <50% respiratory variability. 11. The interatrial septum was not assessed.  FINDINGS  Left Ventricle: The left  ventricle has normal systolic function, with an ejection fraction of 60-65%. The cavity size was normal. There is no increase in left ventricular wall thickness. Left ventricular diastolic function could not be evaluated  secondary to atrial fibrillation.  Right Ventricle: The right ventricle has severely reduced systolic function. The cavity was moderately enlarged. There is no increase in right ventricular wall thickness. Right ventricular systolic pressure is moderately elevated with an estimated  pressure of 49.1 mmHg. Pacing wire/catheter visualized in the right ventricle.  Left Atrium: Left atrial size was severely dilated.  Right Atrium: Right atrial size was severely dilated. Right atrial pressure is estimated at 3 mmHg.  Interatrial Septum: The interatrial septum was not assessed.  Pericardium: Trivial pericardial effusion  is present. There is a large pleural effusion.  Mitral Valve: The mitral valve is normal in structure. Mitral valve regurgitation is trivial by color flow Doppler. No evidence of mitral valve stenosis.  Tricuspid Valve: The tricuspid valve is normal in structure. Tricuspid valve regurgitation is mild by color flow Doppler.  Aortic Valve: The aortic valve is normal in structure. Aortic valve regurgitation was not visualized by color flow Doppler. There is No stenosis of the aortic valve.  Pulmonic Valve: The pulmonic valve was grossly normal. Pulmonic valve regurgitation is trivial by color flow Doppler.  Aorta: The aortic root and ascending aorta are normal in size and structure.  Venous: The inferior vena cava is normal in size with less than 50% respiratory variability.    +--------------+--------++  LEFT VENTRICLE            +----------------+---------++ +--------------+--------++  Diastology                    PLAX 2D                   +----------------+---------++ +--------------+--------++  LV e' lateral:   9.57 cm/s    LVIDd:         3.61 cm    +----------------+---------++ +--------------+--------++  LV E/e' lateral: 12.3         LVIDs:         2.29 cm    +----------------+---------++ +--------------+--------++  LV e' medial:    5.87 cm/s    LV PW:         0.92 cm    +----------------+---------++ +--------------+--------++  LV E/e' medial:  20.1         LV IVS:        0.99 cm    +----------------+---------++ +--------------+--------++  LVOT diam:     1.90 cm    +--------------+--------++  LV SV:         37 ml      +--------------+--------++  LV SV Index:   19.08      +--------------+--------++  LVOT Area:     2.84 cm   +--------------+--------++                            +--------------+--------++  +---------------+----------++  RIGHT VENTRICLE              +---------------+----------++  RV S prime:     12.60 cm/s   +---------------+----------++  RVSP:            54.1 mmHg    +---------------+----------++  +---------------+--------++------------++  LEFT ATRIUM               Index          +---------------+--------++------------++  LA  diam:        5.60 cm   2.87 cm/m     +---------------+--------++------------++  LA Vol (A2C):   199.0 ml  102.03 ml/m   +---------------+--------++------------++  LA Vol (A4C):   163.0 ml  83.57 ml/m    +---------------+--------++------------++  LA Biplane Vol: 179.0 ml  91.77 ml/m    +---------------+--------++------------++ +------------+---------++-----------++  RIGHT ATRIUM            Index         +------------+---------++-----------++  RA Pressure: 8.00 mmHg                +------------+---------++-----------++  RA Area:     39.80 cm                +------------+---------++-----------++  RA Volume:   173.00 ml  88.70 ml/m   +------------+---------++-----------++    +-------------+-------++  AORTA                   +-------------+-------++  Ao Root diam: 2.40 cm   +-------------+-------++  +--------------+----------++  +---------------+-----------++  MITRAL VALVE                  TRICUSPID VALVE               +--------------+----------++  +---------------+-----------++  MV Area (PHT): 3.48 cm       TR Peak grad:   46.1 mmHg     +--------------+----------++  +---------------+-----------++  MV PHT:        63.22 msec     TR Vmax:        347.00 cm/s   +--------------+----------++  +---------------+-----------++  MV Decel Time: 218 msec       Estimated RAP:  8.00 mmHg     +--------------+----------++  +---------------+-----------++ +--------------+-----------++  RVSP:           54.1 mmHg      MV E velocity: 118.00 cm/s   +---------------+-----------++ +--------------+-----------++                               +--------------+-------+                                SHUNTS                                                +--------------+-------+                                Systemic  Diam: 1.90 cm                                +--------------+-------+   Antimicrobials:  Anti-infectives (From admission, onward)   None     Subjective: Seen And examined at bedside and she is extremely hard of hearing but states that she is doing better and had no complaints.  No nausea or vomiting.  Was sitting there watching television.  No other concerns or complaints at this time  Objective: Vitals:   01/11/19 0806 01/11/19 0900 01/11/19 1000 01/11/19 1140  BP:  (!) 94/57 (!) 98/58   Pulse:  (!) 110 (!) 113  Resp:  (!) 22 (!) 23   Temp: 98.7 F (37.1 C)   98.2 F (36.8 C)  TempSrc: Oral   Oral  SpO2:  92% 94%   Weight:      Height:        Intake/Output Summary (Last 24 hours) at 01/11/2019 1240 Last data filed at 01/11/2019 1140 Gross per 24 hour  Intake 1293.2 ml  Output 1300 ml  Net -6.8 ml   Filed Weights   01/09/19 0545 01/10/19 0500 01/11/19 0500  Weight: 73.7 kg 73.7 kg 73.5 kg   Examination: Physical Exam:  Constitutional: Thin chronically ill-appearing Caucasian female currently no acute distress and feels less short of breath today than she did yesterday. Eyes: Lids and conjunctive are normal.  Sclera anicteric ENMT: External ears nose appear normal.  Very hard of hearing Neck: Supple no JVD Respiratory: Diminished auscultation bilaterally with no appreciable wheezing, rales, rhonchi and crackles are improved in the left but she does have coarse breath sounds.  Still has significant crackles on the right and she is slightly tachypneic but wearing 6 L of high flow nasal cannula now Cardiovascular: Irregularly irregular and tachycardi left c but rates in the mid 100s.  Has 1+ lower extremity edema bilaterally with right side being worse Abdomen: Soft, nontender, nondistended.  Bowel sounds GU: Deferred Musculoskeletal: Has an amputated pinky toe on the right Skin: Skin is warm and dry no appreciable rashes or lesions on to skin evaluation but has pressure  ulcer on the backside in her foot Neurologic: Cranial nerves II through XII grossly intact except that she is very hard of hearing Psychiatric: Normal judgment.  She is awake, alert, oriented.  Has a slightly anxious mood  Data Reviewed: I have personally reviewed following labs and imaging studies  CBC: Recent Labs  Lab 01/04/2019 1200 01/19/2019 1426 01/09/19 0331 01/10/19 0515 01/11/19 0555  WBC 6.0  --  5.9 5.4 5.6  NEUTROABS  --   --  4.6 4.3 4.8  HGB 9.3*  --  8.5* 8.5* 8.2*  HCT 29.4*  --  27.2* 27.9* 26.2*  MCV 134.2*  --  132.7* 134.1* 130.3*  PLT 248 200 195 207 517   Basic Metabolic Panel: Recent Labs  Lab 01/30/2019 1200 01/30/2019 1426 01/09/19 0331 01/09/19 1933 01/10/19 0515 01/10/19 0839 01/11/19 0555  NA 138  --  136 136  --  135 136  K 4.5  --  4.2 3.9  --  3.8 4.3  CL 105  --  108 104  --  105 106  CO2 20*  --  21* 20*  --  21* 19*  GLUCOSE 106*  --  109* 97  --  95 74  BUN 53*  --  53* 54*  --  55* 59*  CREATININE 1.76*  --  1.63* 1.66*  --  1.65* 1.80*  CALCIUM 8.6*  --  8.3* 8.4*  --  8.4* 8.2*  MG  --  2.5* 2.3  --  2.1  --  2.2  PHOS  --  4.5 4.2  --  4.1  --  4.9*   GFR: Estimated Creatinine Clearance: 30.7 mL/min (A) (by C-G formula based on SCr of 1.8 mg/dL (H)). Liver Function Tests: Recent Labs  Lab 01/11/2019 1426 01/09/19 0331 01/09/19 1933 01/10/19 0839 01/11/19 0555  AST 14* 13* 13* 12* 11*  ALT 7 9 9 7 8   ALKPHOS 72 65 64 60 54  BILITOT 1.2 1.6* 1.4* 1.5* 1.5*  PROT 6.6  6.0* 6.3* 5.8* 5.6*  ALBUMIN 3.6 3.3* 3.4* 3.1* 2.9*   No results for input(s): LIPASE, AMYLASE in the last 168 hours. No results for input(s): AMMONIA in the last 168 hours. Coagulation Profile: Recent Labs  Lab 01/16/2019 1246 01/27/2019 1426 01/09/19 0331 01/10/19 0839 01/11/19 1151  INR 3.7* 3.7* 3.8* 3.1* 3.1*   Cardiac Enzymes: Recent Labs  Lab 01/29/2019 1200 01/31/2019 1423 01/13/2019 2137 01/09/19 0330  TROPONINI <0.03 0.03* <0.03 <0.03   BNP (last  3 results) No results for input(s): PROBNP in the last 8760 hours. HbA1C: Recent Labs    01/09/19 0331  HGBA1C 4.9   CBG: Recent Labs  Lab 01/09/19 0738 01/10/19 0755 01/10/19 1126 01/11/19 0805  GLUCAP 97 90 75 73   Lipid Profile: No results for input(s): CHOL, HDL, LDLCALC, TRIG, CHOLHDL, LDLDIRECT in the last 72 hours. Thyroid Function Tests: Recent Labs    01/19/2019 1246 01/11/2019 2137  TSH 0.141*  --   FREET4  --  1.61   Anemia Panel: Recent Labs    01/21/2019 1412 01/07/2019 1426 01/11/19 0555  VITAMINB12 714  --   --   FOLATE  --  12.7  --   FERRITIN 161  --   --   TIBC 220*  --   --   IRON 34  --   --   RETICCTPCT  --  2.2 2.8   Sepsis Labs: Recent Labs  Lab 01/19/2019 1200 01/20/2019 1426  LATICACIDVEN 2.2* 1.1    Recent Results (from the past 240 hour(s))  Blood culture (routine x 2)     Status: None (Preliminary result)   Collection Time: 01/24/2019 12:00 PM  Result Value Ref Range Status   Specimen Description BLOOD RIGHT ARM  Final   Special Requests   Final    Blood Culture adequate volume BOTTLES DRAWN AEROBIC AND ANAEROBIC   Culture   Final    NO GROWTH 3 DAYS Performed at [facility], [address] [place], [place] [address]    Report Status PENDING  Incomplete  SARS Coronavirus 2 (CEPHEID - Performed in [place] hospital lab), Hosp Order     Status: None   Collection Time: 01/04/2019 12:17 PM  Result Value Ref Range Status   SARS Coronavirus 2 NEGATIVE NEGATIVE Final    Comment: (NOTE) If result is NEGATIVE SARS-CoV-2 target nucleic acids are NOT DETECTED. The SARS-CoV-2 RNA is generally detectable in upper and lower  respiratory specimens during the acute phase of infection. The lowest  concentration of SARS-CoV-2 viral copies this assay can detect is 250  copies / mL. A negative result does not preclude SARS-CoV-2 infection  and should not be used as the sole basis for treatment or other  patient management decisions.  A negative  result may occur with  improper specimen collection / handling, submission of specimen other  than nasopharyngeal swab, presence of viral mutation(s) within the  areas targeted by this assay, and inadequate number of viral copies  (<250 copies / mL). A negative result must be combined with clinical  observations, patient history, and epidemiological information. If result is POSITIVE SARS-CoV-2 target nucleic acids are DETECTED. The SARS-CoV-2 RNA is generally detectable in upper and lower  respiratory specimens dur ing the acute phase of infection.  Positive  results are indicative of active infection with SARS-CoV-2.  Clinical  correlation with patient history and other diagnostic information is  necessary to determine patient infection status.  Positive results do  not rule out bacterial  infection or co-infection with other viruses. If result is PRESUMPTIVE POSTIVE SARS-CoV-2 nucleic acids MAY BE PRESENT.   A presumptive positive result was obtained on the submitted specimen  and confirmed on repeat testing.  While 2019 novel coronavirus  (SARS-CoV-2) nucleic acids may be present in the submitted sample  additional confirmatory testing may be necessary for epidemiological  and / or clinical management purposes  to differentiate between  SARS-CoV-2 and other Sarbecovirus currently known to infect humans.  If clinically indicated additional testing with an alternate test  methodology [phone]) is advised. The SARS-CoV-2 RNA is generally  detectable in upper and lower respiratory sp ecimens during the acute  phase of infection. The expected result is Negative. Fact Sheet for Patients:  StrictlyIdeas.no Fact Sheet for Healthcare Providers: BankingDealers.co.za This test is not yet approved or cleared by the [place] FDA and has been authorized for detection and/or diagnosis of SARS-CoV-2 by FDA under an Emergency Use Authorization  (EUA).  This EUA will remain in effect (meaning this test can be used) for the duration of the COVID-19 declaration under Section 564(b)(1) of the Act, 21 U.S.C. section 360bbb-3(b)(1), unless the authorization is terminated or revoked sooner. Performed at [facility], [address] [place], [place] [address]   Blood culture (routine x 2)     Status: None (Preliminary result)   Collection Time: 01/20/2019 12:46 PM  Result Value Ref Range Status   Specimen Description LEFT ANTECUBITAL  Final   Special Requests   Final    BOTTLES DRAWN AEROBIC AND ANAEROBIC Blood Culture adequate volume   Culture   Final    NO GROWTH 3 DAYS Performed at [facility], [address], [place], [place] [address]    Report Status PENDING  Incomplete  MRSA PCR Screening     Status: None   Collection Time: 01/24/2019  7:57 PM  Result Value Ref Range Status   MRSA by PCR NEGATIVE NEGATIVE Final    Comment:        The GeneXpert MRSA Assay (FDA approved for NASAL specimens only), is one component of a comprehensive MRSA colonization surveillance program. It is not intended to diagnose MRSA infection nor to guide or monitor treatment for MRSA infections. Performed at [facility], [address] [place], [place] [address]   Gram stain     Status: None   Collection Time: 01/10/19 12:43 PM  Result Value Ref Range Status   Specimen Description PLEURAL  Final   Special Requests NONE  Final   Gram Stain   Final    CYTOSPIN SMEAR NO ORGANISMS SEEN WBC PRESENT,BOTH PMN AND MONONUCLEAR Performed at [facility], [address] [place], [place] [address]    Report Status 01/10/2019 FINAL  Final    RN Pressure Injury Documentation and I am in current agreement with the RN's Assessment Pressure Injury 01/15/2019 Sacrum Right Stage II -  Partial thickness loss of dermis presenting as a shallow open ulcer with a red, pink wound bed without slough. round, open, red circular wound (Active)  01/24/2019 1700  Location:  Sacrum  Location Orientation: Right  Staging: Stage II -  Partial thickness loss of dermis presenting as a shallow open ulcer with a red, pink wound bed without slough.  Wound Description (Comments): round, open, red circular wound  Present on Admission: Yes     Pressure Injury 01/26/2019 Foot Right Stage I -  Intact skin with non-blanchable redness of a localized area usually over a bony prominence. skin intact;discoloration;no  drainage (Active)  02/01/2019 1900  Location: Foot  Location Orientation: Right  Staging: Stage I -  Intact skin with non-blanchable redness of a localized area usually over a bony prominence.  Wound Description (Comments): skin intact;discoloration;no drainage  Present on Admission: Yes    Radiology Studies: Dg Chest Port 1 View  Result Date: 01/11/2019 CLINICAL DATA:  Shortness of breath. EXAM: PORTABLE CHEST 1 VIEW COMPARISON:  01/10/2019 FINDINGS: Stable top-normal heart size. Tiny left apical pneumothorax remains which is stable to slightly decreased in size. Stable moderate to large right pleural effusion and small left pleural effusion. No overt pulmonary edema. IMPRESSION: Stable to slightly diminished small left apically pneumothorax. Stable moderate to large right pleural effusion and small left pleural effusion. Electronically Signed   By: Aletta Edouard M.D.   On: 01/11/2019 08:37   Dg Chest Port 1 View  Result Date: 01/10/2019 CLINICAL DATA:  78 year old female with history of thoracentesis today complicated by pneumothorax. EXAM: PORTABLE CHEST 1 VIEW COMPARISON:  Chest x-ray 01/10/2019. FINDINGS: Stable appearing left-sided hydropneumothorax with very tiny left apical pneumothorax component occupying less than 5% of the volume of the left hemithorax, and moderate left pleural effusion. Moderate right pleural effusion is similar to the prior study. Bibasilar opacities may reflect areas of atelectasis and/or airspace consolidation. Cephalization of the pulmonary  vasculature with indistinct interstitial markings, indicative of mild interstitial pulmonary edema. Mild cardiomegaly. Upper mediastinal contours are within normal limits. Aortic atherosclerosis. IMPRESSION: 1. Unchanged left-sided hydropneumothorax. 2. The appearance of the chest suggests congestive heart failure. 3. Aortic atherosclerosis. Electronically Signed   By: Vinnie Langton M.D.   On: 01/10/2019 18:54   Dg Chest Port 1 View  Result Date: 01/10/2019 CLINICAL DATA:  LEFT pleural effusion post thoracentesis EXAM: PORTABLE CHEST 1 VIEW COMPARISON:  Earlier study of 01/10/2019 at 0527 hours FINDINGS: Enlargement of cardiac silhouette with pulmonary vascular congestion. Minimal pulmonary edema, slightly improved. Bibasilar effusions and atelectasis, decreased on LEFT since earlier exam post thoracentesis. Small LEFT apically pneumothorax time without mediastinal shift. Osseous structures unremarkable. IMPRESSION: Small LEFT apical pneumothorax post thoracentesis. Findings called to Dr. Alfredia Ferguson on 01/10/2019 at 1420 hours. Follow-up chest radiograph will be ordered for 1800 hours this evening. Electronically Signed   By: Lavonia Dana M.D.   On: 01/10/2019 14:22   Dg Chest Port 1 View  Result Date: 01/10/2019 CLINICAL DATA:  Shortness of breath EXAM: PORTABLE CHEST 1 VIEW COMPARISON:  01/09/2019 FINDINGS: Cardiac shadow is stable. Bilateral pleural effusions and bibasilar infiltrates are again seen and stable. No new focal abnormality is noted. IMPRESSION: Stable pleural effusions and basilar airspace opacities. Given some technical variations in the film the overall appearance is stable. Electronically Signed   By: Inez Catalina M.D.   On: 01/10/2019 07:53   US Thoracentesis Asp Pleural Space W/img Guide  Result Date: 01/10/2019 INDICATION: LEFT pleural effusion EXAM: ULTRASOUND GUIDED DIAGNOSTIC AND THERAPEUTIC LEFT THORACENTESIS MEDICATIONS: None. COMPLICATIONS: None immediate. PROCEDURE: Procedure,  benefits, and risks of procedure were discussed with patient. Written informed consent for procedure was obtained. Time out protocol followed. Pleural effusion localized by ultrasound at the posterior LEFT hemithorax. Skin prepped and draped in usual sterile fashion. Skin and soft tissues anesthetized with 8 mL of 1% lidocaine. 8 French thoracentesis catheter placed into the LEFT pleural space. 1.2 L of yellow LEFT pleural fluid aspirated by syringe pump. Procedure tolerated well by patient without immediate complication. FINDINGS: A total of approximately 1.2 L of LEFT pleural fluid was removed. Samples  were sent to the laboratory as requested by the clinical team. IMPRESSION: Successful ultrasound guided LEFT thoracentesis yielding 1.2 L of pleural fluid. Electronically Signed   By: Lavonia Dana M.D.   On: 01/10/2019 14:15   Scheduled Meds:  Chlorhexidine Gluconate Cloth  6 each Topical Daily   furosemide  40 mg Intravenous BID   gabapentin  100 mg Oral TID   guaiFENesin  600 mg Oral BID   ipratropium  0.5 mg Nebulization TID   levalbuterol  0.63 mg Nebulization TID   levothyroxine  125 mcg Oral Q0600   mouth rinse  15 mL Mouth Rinse BID   pantoprazole  40 mg Oral Daily   pravastatin  10 mg Oral q1800   Continuous Infusions:  amiodarone 30 mg/hr (01/11/19 0600)   diltiazem (CARDIZEM) infusion 5 mg/hr (01/11/19 0600)    LOS: 3 days   Kerney Elbe, DO [facility] PAGER is on AMION  If 7PM-7AM, please contact night-coverage [email or web address] Password [email or web address] 01/11/2019, 12:40 PM

## 2019-01-11 NOTE — Final Consult Note (Signed)
[facility] Progress Note  Name: Charlotte [name]      MRN: [id number]    Location: IC05/IC05-01  Date: 01/11/2019 Time:5:08 PM   Subjective: Interval History:Edgar H Hoxworth was seen today.  She is presently lying in the bed.  She reportedly sat by the side of the bed for few minutes.  Overall her breathing is better.  She denies any bleeding per rectum or melena.  No abdominal pain or chest pains reported.  Denies any itching or symptoms of erythromelalgia.  Objective: Vital signs in last 24 hours: Temp:  [98.2 F (36.8 C)-99.7 F (37.6 C)] 99.5 F (37.5 C) (06/09 1629) Pulse Rate:  [40-125] 116 (06/09 1600) Resp:  [20-32] 31 (06/09 1600) BP: (90-134)/(48-98) 106/68 (06/09 1600) SpO2:  [58 %-100 %] 91 % (06/09 1600) Weight:  [162 lb 0.6 oz (73.5 kg)] 162 lb 0.6 oz (73.5 kg) (06/09 0500)    Intake/Output from previous day: 06/08 0800 - 06/09 0759 In: 967.4 [P.O.:480; I.V.:487.4] Out: 800 [Urine:800]    Intake/Output this shift: Total I/O In: 455.7 [P.O.:240; I.V.:215.7] Out: 950 [Urine:950]   PHYSICAL EXAM: BP 106/68   Pulse (!) 116   Temp 99.5 F (37.5 C) (Oral)   Resp (!) 31   Ht 6' (1.829 m)   Wt 162 lb 0.6 oz (73.5 kg)   LMP  (LMP Unknown)   SpO2 91%   BMI 21.98 kg/m  General appearance: alert and cooperative Neck: no adenopathy and supple, symmetrical, trachea midline Lungs: Bilateral air entry with decreased breath sounds at the bases. Heart: irregularly irregular rhythm Abdomen: soft, non-tender; bowel sounds normal; no masses,  no organomegaly Extremities: extremities normal, atraumatic, no cyanosis or edema Skin: Skin color, texture, turgor normal. No rashes or lesions Lymph nodes: Cervical, supraclavicular, and axillary nodes normal. Neurologic: Grossly normal   Studies/Results: Results for orders placed or performed during the hospital encounter of 01/11/2019 (from the past 48 hour(s))  Comprehensive metabolic panel     Status:  Abnormal   Collection Time: 01/09/19  7:33 PM  Result Value Ref Range   Sodium 136 135 - 145 mmol/L   Potassium 3.9 3.5 - 5.1 mmol/L   Chloride 104 98 - 111 mmol/L   CO2 20 (L) 22 - 32 mmol/L   Glucose, Bld 97 70 - 99 mg/dL   BUN 54 (H) 8 - 23 mg/dL   Creatinine, Ser 1.66 (H) 0.44 - 1.00 mg/dL   Calcium 8.4 (L) 8.9 - 10.3 mg/dL   Total Protein 6.3 (L) 6.5 - 8.1 g/dL   Albumin 3.4 (L) 3.5 - 5.0 g/dL   AST 13 (L) 15 - 41 U/L   ALT 9 0 - 44 U/L   Alkaline Phosphatase 64 38 - 126 U/L   Total Bilirubin 1.4 (H) 0.3 - 1.2 mg/dL   GFR calc non Af Amer 30 (L) >60 mL/min   GFR calc Af Amer 34 (L) >60 mL/min   Anion gap 12 5 - 15    Comment: Performed at [facility], [address], [place], [place] [address]  CBC with Differential/Platelet     Status: Abnormal   Collection Time: 01/10/19  5:15 AM  Result Value Ref Range   WBC 5.4 4.0 - 10.5 K/uL   RBC 2.08 (L) 3.87 - 5.11 MIL/uL   Hemoglobin 8.5 (L) 12.0 - 15.0 g/dL   HCT 27.9 (L) 36.0 - 46.0 %   MCV 134.1 (H) 80.0 - 100.0 fL   MCH 40.9 (H) 26.0 -  34.0 pg   MCHC 30.5 30.0 - 36.0 g/dL   RDW 20.6 (H) 11.5 - 15.5 %   Platelets 207 150 - 400 K/uL   nRBC 1.1 (H) 0.0 - 0.2 %   Neutrophils Relative % 79 %   Neutro Abs 4.3 1.7 - 7.7 K/uL   Lymphocytes Relative 10 %   Lymphs Abs 0.6 (L) 0.7 - 4.0 K/uL   Monocytes Relative 5 %   Monocytes Absolute 0.3 0.1 - 1.0 K/uL   Eosinophils Relative 2 %   Eosinophils Absolute 0.1 0.0 - 0.5 K/uL   Basophils Relative 2 %   Basophils Absolute 0.1 0.0 - 0.1 K/uL   RBC Morphology MACROCYTOSIS PRESENT    Immature Granulocytes 2 %   Abs Immature Granulocytes 0.13 (H) 0.00 - 0.07 K/uL   Reactive, Benign Lymphocytes PRESENT    Polychromasia PRESENT     Comment: Performed at [facility], [address] [place], [place] [address]  Magnesium     Status: None   Collection Time: 01/10/19  5:15 AM  Result Value Ref Range   Magnesium 2.1 1.7 - 2.4 mg/dL    Comment: Performed at [facility], [address] [place], [place] [address]  Phosphorus     Status: None   Collection Time: 01/10/19  5:15 AM  Result Value Ref Range   Phosphorus 4.1 2.5 - 4.6 mg/dL    Comment: Performed at [facility], [address] [place], [place] [address]  Glucose, capillary     Status: None   Collection Time: 01/10/19  7:55 AM  Result Value Ref Range   Glucose-Capillary 90 70 - 99 mg/dL  Blood gas, arterial     Status: Abnormal   Collection Time: 01/10/19  8:35 AM  Result Value Ref Range   FIO2 48.00    pH, Arterial 7.428 7.350 - 7.450   pCO2 arterial 30.9 (L) 32.0 - 48.0 mmHg   pO2, Arterial 61.1 (L) 83.0 - 108.0 mmHg   Bicarbonate 21.6 20.0 - 28.0 mmol/L   Acid-base deficit 3.5 (H) 0.0 - 2.0 mmol/L   O2 Saturation 91.4 %   Patient temperature 36.6    Allens test (pass/fail) PASS PASS    Comment: Performed at [facility], [address], [place], [place] [address]  Comprehensive metabolic panel     Status: Abnormal   Collection Time: 01/10/19  8:39 AM  Result Value Ref Range   Sodium 135 135 - 145 mmol/L   Potassium 3.8 3.5 - 5.1 mmol/L   Chloride 105 98 - 111 mmol/L   CO2 21 (L) 22 - 32 mmol/L   Glucose, Bld 95 70 - 99 mg/dL   BUN 55 (H) 8 - 23 mg/dL   Creatinine, Ser 1.65 (H) 0.44 - 1.00 mg/dL   Calcium 8.4 (L) 8.9 - 10.3 mg/dL   Total Protein 5.8 (L) 6.5 - 8.1 g/dL   Albumin 3.1 (L) 3.5 - 5.0 g/dL   AST 12 (L) 15 - 41 U/L   ALT 7 0 - 44 U/L   Alkaline Phosphatase 60 38 - 126 U/L   Total Bilirubin 1.5 (H) 0.3 - 1.2 mg/dL   GFR calc non Af Amer 30 (L) >60 mL/min   GFR calc Af Amer 35 (L) >60 mL/min   Anion gap 9 5 - 15    Comment: Performed at [facility], [address] [place],  [address]  Protime-INR     Status: Abnormal   Collection Time: 01/10/19  8:39 AM  Result Value Ref Range   Prothrombin Time 31.3 (H) 11.4 - 15.2 seconds   INR 3.1 (H) 0.8 - 1.2    Comment: (NOTE) INR goal varies based on device and disease states. Performed at [facility], [address],  [place], [place] [address]   Glucose, capillary     Status: None   Collection Time: 01/10/19 11:26 AM  Result Value Ref Range   Glucose-Capillary 75 70 - 99 mg/dL  Albumin, pleural or peritoneal fluid     Status: None   Collection Time: 01/10/19 12:42 PM  Result Value Ref Range   Albumin, Fluid 1.7 g/dL    Comment: (NOTE) No normal range established for this test Results should be evaluated in conjunction with serum values    Fluid Type-FALB Pleural Fld     Comment: Performed at [facility], [address] [place], [place] [address]  Body fluid cell count with differential     Status: Abnormal   Collection Time: 01/10/19 12:42 PM  Result Value Ref Range   Fluid Type-FCT PLEURAL    Color, Fluid YELLOW (A) YELLOW   Appearance, Fluid CLEAR CLEAR   WBC, Fluid 134 0 - 1,000 cu mm   Neutrophil Count, Fluid 91 (H) 0 - 25 %   Lymphs, Fluid 2 %   Monocyte-Macrophage-Serous Fluid 7 (L) 50 - 90 %   Eos, Fluid 0 %   Other Cells, Fluid 1 %    Comment: FEW MESOTHELIAL CELLS Performed at [facility], [address], [place], [place] [address]   Glucose, pleural or peritoneal fluid     Status: None   Collection Time: 01/10/19 12:42 PM  Result Value Ref Range   Glucose, Fluid 93 mg/dL    Comment: (NOTE) No normal range established for this test Results should be evaluated in conjunction with serum values    Fluid Type-FGLU Pleural Fld     Comment: Performed at [facility], [address], [place], [place] [address]  Lactate dehydrogenase (pleural or peritoneal fluid)     Status: Abnormal   Collection Time: 01/10/19 12:42 PM  Result Value Ref Range   LD, Fluid 185 (H) 3 - 23 U/L    Comment: (NOTE) Results should be evaluated in conjunction with serum values    Fluid Type-FLDH Pleural Fld     Comment: Performed at [facility] [address] [place], [place] [address]  [facility], Body Fluid     Status: None   Collection Time: 01/10/19 12:42 PM  Result Value Ref Range   pH, Body Fluid 7.5 Not  Estab.    Comment: (NOTE) This test was developed and its performance characteristics determined by [facility]. It has not been cleared or approved by the Food and Drug Administration. The reference interval(s) and other method performance specifications have not been established for this body fluid. The test result must be integrated into the clinical context for interpretation. Performed At: [facility] [place], [place] [id number] Rush Farmer MD JJ:[id number]    Source of Sample PLEURAL     Comment: Performed at [facility], [address], [place], [place] [address]  Protein, pleural or peritoneal fluid     Status: None   Collection Time: 01/10/19 12:42 PM  Result Value Ref Range   Total protein, fluid <3.0 g/dL   Fluid Type-FTP Pleural Fld     Comment: Performed at [facility], [address], [place], [place] [address]  Gram stain     Status: None  Collection Time: 01/10/19 12:43 PM  Result Value Ref Range   Specimen Description PLEURAL    Special Requests NONE    Gram Stain      CYTOSPIN SMEAR NO ORGANISMS SEEN WBC PRESENT,BOTH PMN AND MONONUCLEAR Performed at [facility], [address] [place], [place] [address]    Report Status 01/10/2019 FINAL   CBC with Differential/Platelet     Status: Abnormal   Collection Time: 01/11/19  5:55 AM  Result Value Ref Range   WBC 5.6 4.0 - 10.5 K/uL   RBC 2.01 (L) 3.87 - 5.11 MIL/uL   Hemoglobin 8.2 (L) 12.0 - 15.0 g/dL   HCT 26.2 (L) 36.0 - 46.0 %   MCV 130.3 (H) 80.0 - 100.0 fL   MCH 40.8 (H) 26.0 - 34.0 pg   MCHC 31.3 30.0 - 36.0 g/dL   RDW 21.2 (H) 11.5 - 15.5 %   Platelets 252 150 - 400 K/uL   nRBC 0.5 (H) 0.0 - 0.2 %   Neutrophils Relative % 82 %   Neutro Abs 4.8 1.7 - 7.7 K/uL   Lymphocytes Relative 6 %   Lymphs Abs 0.4 (L) 0.7 - 4.0 K/uL   Monocytes Relative 7 %   Monocytes Absolute 0.4 0.1 - 1.0 K/uL   Eosinophils Relative 2 %   Eosinophils Absolute 0.1 0.0 - 0.5 K/uL   Basophils  Relative 1 %   Basophils Absolute 0.1 0.0 - 0.1 K/uL   Immature Granulocytes 2 %   Abs Immature Granulocytes 0.11 (H) 0.00 - 0.07 K/uL    Comment: Performed at [facility], [address] [place], [place] [address]  Comprehensive metabolic panel     Status: Abnormal   Collection Time: 01/11/19  5:55 AM  Result Value Ref Range   Sodium 136 135 - 145 mmol/L   Potassium 4.3 3.5 - 5.1 mmol/L   Chloride 106 98 - 111 mmol/L   CO2 19 (L) 22 - 32 mmol/L   Glucose, Bld 74 70 - 99 mg/dL   BUN 59 (H) 8 - 23 mg/dL   Creatinine, Ser 1.80 (H) 0.44 - 1.00 mg/dL   Calcium 8.2 (L) 8.9 - 10.3 mg/dL   Total Protein 5.6 (L) 6.5 - 8.1 g/dL   Albumin 2.9 (L) 3.5 - 5.0 g/dL   AST 11 (L) 15 - 41 U/L   ALT 8 0 - 44 U/L   Alkaline Phosphatase 54 38 - 126 U/L   Total Bilirubin 1.5 (H) 0.3 - 1.2 mg/dL   GFR calc non Af Amer 27 (L) >60 mL/min   GFR calc Af Amer 31 (L) >60 mL/min   Anion gap 11 5 - 15    Comment: Performed at [facility], [address], [place], [place] [address]  Magnesium     Status: None   Collection Time: 01/11/19  5:55 AM  Result Value Ref Range   Magnesium 2.2 1.7 - 2.4 mg/dL    Comment: Performed at [facility], [address] [place], [place] [address]  Phosphorus     Status: Abnormal   Collection Time: 01/11/19  5:55 AM  Result Value Ref Range   Phosphorus 4.9 (H) 2.5 - 4.6 mg/dL    Comment: Performed at [facility], [address] [place], [place] [address]  Lactate dehydrogenase     Status: Abnormal   Collection Time: 01/11/19  5:55 AM  Result Value Ref Range   LDH 628 (H) 98 - 192 U/L    Comment: Performed at [facility], [address], [place], [place] [address]  Reticulocytes     Status: Abnormal   Collection Time: 01/11/19  5:55 AM  Result Value Ref Range   Retic Ct Pct 2.8 0.4 - 3.1 %   RBC. 2.04 (L) 3.87 - 5.11 MIL/uL   Retic Count, Absolute 57.3 19.0 - 186.0 K/uL   Immature Retic Fract 36.3 (H) 2.3 - 15.9 %    Comment: Performed at [facility], [address], [place], [place] [address]  Glucose, capillary     Status: None   Collection Time: 01/11/19  8:05 AM  Result Value Ref Range   Glucose-Capillary 73 70 - 99 mg/dL  Protime-INR     Status: Abnormal   Collection Time: 01/11/19 11:51 AM  Result Value Ref Range   Prothrombin Time 31.6 (H) 11.4 - 15.2 seconds   INR 3.1 (H) 0.8 - 1.2    Comment: (NOTE) INR goal varies based on device and disease states. Performed at [facility], [address], [place], [place] [address]    Dg Chest Port 1 View  Result Date: 01/11/2019 CLINICAL DATA:  Shortness of breath. EXAM: PORTABLE CHEST 1 VIEW COMPARISON:  01/10/2019 FINDINGS: Stable top-normal heart size. Tiny left apical pneumothorax remains which is stable to slightly decreased in size. Stable moderate to large right pleural effusion and small left pleural effusion. No overt pulmonary edema. IMPRESSION: Stable to slightly diminished small left apically pneumothorax. Stable moderate to large right pleural effusion and small left pleural effusion. Electronically Signed   By: Aletta Edouard M.D.   On: 01/11/2019 08:37   Dg Chest Port 1 View  Result Date: 01/10/2019 CLINICAL DATA:  78 year old female with history of thoracentesis today complicated by pneumothorax. EXAM: PORTABLE CHEST 1 VIEW COMPARISON:  Chest x-ray 01/10/2019. FINDINGS: Stable appearing left-sided hydropneumothorax with very tiny left apical pneumothorax component occupying less than 5% of the volume of the left hemithorax, and moderate left pleural effusion. Moderate right pleural effusion is similar to the prior study. Bibasilar opacities may reflect areas of atelectasis and/or airspace consolidation. Cephalization of the pulmonary vasculature with indistinct interstitial markings, indicative of mild interstitial pulmonary edema. Mild cardiomegaly. Upper mediastinal contours are within normal limits. Aortic atherosclerosis. IMPRESSION: 1. Unchanged left-sided hydropneumothorax. 2. The  appearance of the chest suggests congestive heart failure. 3. Aortic atherosclerosis. Electronically Signed   By: Vinnie Langton M.D.   On: 01/10/2019 18:54   Dg Chest Port 1 View  Result Date: 01/10/2019 CLINICAL DATA:  LEFT pleural effusion post thoracentesis EXAM: PORTABLE CHEST 1 VIEW COMPARISON:  Earlier study of 01/10/2019 at 0527 hours FINDINGS: Enlargement of cardiac silhouette with pulmonary vascular congestion. Minimal pulmonary edema, slightly improved. Bibasilar effusions and atelectasis, decreased on LEFT since earlier exam post thoracentesis. Small LEFT apically pneumothorax time without mediastinal shift. Osseous structures unremarkable. IMPRESSION: Small LEFT apical pneumothorax post thoracentesis. Findings called to Dr. Alfredia Ferguson on 01/10/2019 at 1420 hours. Follow-up chest radiograph will be ordered for 1800 hours this evening. Electronically Signed   By: Lavonia Dana M.D.   On: 01/10/2019 14:22   Dg Chest Port 1 View  Result Date: 01/10/2019 CLINICAL DATA:  Shortness of breath EXAM: PORTABLE CHEST 1 VIEW COMPARISON:  01/09/2019 FINDINGS: Cardiac shadow is stable. Bilateral pleural effusions and bibasilar infiltrates are again seen and stable. No new focal abnormality is noted. IMPRESSION: Stable pleural effusions and basilar airspace opacities. Given some technical variations in the film the overall appearance is stable. Electronically Signed   By: Inez Catalina  M.D.   On: 01/10/2019 07:53   US Thoracentesis Asp Pleural Space W/img Guide  Result Date: 01/10/2019 INDICATION: LEFT pleural effusion EXAM: ULTRASOUND GUIDED DIAGNOSTIC AND THERAPEUTIC LEFT THORACENTESIS MEDICATIONS: None. COMPLICATIONS: None immediate. PROCEDURE: Procedure, benefits, and risks of procedure were discussed with patient. Written informed consent for procedure was obtained. Time out protocol followed. Pleural effusion localized by ultrasound at the posterior LEFT hemithorax. Skin prepped and draped in usual sterile  fashion. Skin and soft tissues anesthetized with 8 mL of 1% lidocaine. 8 French thoracentesis catheter placed into the LEFT pleural space. 1.2 L of yellow LEFT pleural fluid aspirated by syringe pump. Procedure tolerated well by patient without immediate complication. FINDINGS: A total of approximately 1.2 L of LEFT pleural fluid was removed. Samples were sent to the laboratory as requested by the clinical team. IMPRESSION: Successful ultrasound guided LEFT thoracentesis yielding 1.2 L of pleural fluid. Electronically Signed   By: Lavonia Dana M.D.   On: 01/10/2019 14:15     MEDICATIONS: I have reviewed the patient's current medications.     Assessment/Plan:  1.  Macrocytic anemia: -Previous work-up including R67, folic acid were normal.  Ferritin was 161 with percent saturation of 15.  Haptoglobin was normal.  Reticulocyte count was normal.  FOBT was positive during admission.  No overt bleeding.  Total bilirubin is stable around 1.5. -Hemoglobin today stable around 8.2.  MCV is 130. - Hydroxyurea continues to be on hold. 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 -We will continue to closely monitor her hemoglobin, LDH and reticulocyte count daily.  2.  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 -This is currently on hold since admission.  3.  Renal insufficiency: -Creatinine is 1.8 today.  Lasix was cut down to 40 mg twice daily.  4.  Atrial fibrillation with RVR: - She is on IV diltiazem and IV amiodarone.  Rate is better controlled today. -She also has acute on chronic diastolic heart failure, LVEF 60 to 65%.  5.  Pleural effusions: -Status post left thoracentesis on 01/10/2019 with 1.2 L of fluid removed.  All questions were answered. The patient knows to call the clinic with any problems, questions or concerns. We can certainly see the patient much sooner if  necessary.  Derek Jack

## 2019-01-11 NOTE — Progress Notes (Signed)
Progress Note  Patient Name: Charlotte [name] Date of Encounter: 01/11/2019  Primary Cardiologist: Kate Sable, MD  Subjective   No chest pain or breathlessness at rest.  She does not report any sense of palpitations.  No abdominal pain.  Inpatient Medications    Scheduled Meds:  Chlorhexidine Gluconate Cloth  6 each Topical Daily   furosemide  60 mg Intravenous BID   gabapentin  100 mg Oral TID   guaiFENesin  600 mg Oral BID   ipratropium  0.5 mg Nebulization TID   levalbuterol  0.63 mg Nebulization TID   levothyroxine  125 mcg Oral Q0600   mouth rinse  15 mL Mouth Rinse BID   pantoprazole  40 mg Oral Daily   pravastatin  10 mg Oral q1800   Continuous Infusions:  amiodarone 30 mg/hr (01/11/19 0600)   diltiazem (CARDIZEM) infusion 5 mg/hr (01/11/19 0600)   PRN Meds: acetaminophen **OR** acetaminophen, bisacodyl, ondansetron **OR** ondansetron (ZOFRAN) IV, senna-docusate, traMADol   Vital Signs    Vitals:   01/11/19 0600 01/11/19 0630 01/11/19 0700 01/11/19 0806  BP: 94/71 (!) 102/56 (!) 94/57   Pulse: 96 (!) 106 (!) 109   Resp: (!) 32 (!) 21 (!) 21   Temp:    98.7 F (37.1 C)  TempSrc:    Oral  SpO2: 94% 93% 93%   Weight:      Height:        Intake/Output Summary (Last 24 hours) at 01/11/2019 0905 Last data filed at 01/11/2019 0600 Gross per 24 hour  Intake 967.43 ml  Output 800 ml  Net 167.43 ml   Filed Weights   01/09/19 0545 01/10/19 0500 01/11/19 0500  Weight: 73.7 kg 73.7 kg 73.5 kg    Telemetry    Atrial fibrillation.  Personally reviewed.  ECG    Tracing from 01/23/2019 shows atrial fibrillation, low voltage in the limb leads, diffuse nonspecific ST-T changes, aberrantly conducted complexes.  Personally reviewed.  Physical Exam   GEN:  Elderly woman, no acute distress.   Neck: No JVD. Cardiac:  Irregularly irregular, no rub, or gallop.  Respiratory:  Decreased breath sounds and egophony mid zone on the right lower on the  left. GI: Soft, bowel sounds present. MS:  Protective boots in place; No deformity. Neuro:  Nonfocal. Psych: Alert and oriented x 3. Normal affect.  Labs    Chemistry Recent Labs  Lab 01/09/19 1933 01/10/19 0839 01/11/19 0555  NA 136 135 136  K 3.9 3.8 4.3  CL 104 105 106  CO2 20* 21* 19*  GLUCOSE 97 95 74  BUN 54* 55* 59*  CREATININE 1.66* 1.65* 1.80*  CALCIUM 8.4* 8.4* 8.2*  PROT 6.3* 5.8* 5.6*  ALBUMIN 3.4* 3.1* 2.9*  AST 13* 12* 11*  ALT 9 7 8   ALKPHOS 64 60 54  BILITOT 1.4* 1.5* 1.5*  GFRNONAA 30* 30* 27*  GFRAA 34* 35* 31*  ANIONGAP 12 9 11      Hematology Recent Labs  Lab 01/09/19 0331 01/10/19 0515 01/11/19 0555  WBC 5.9 5.4 5.6  RBC 2.05* 2.08* 2.01*   2.04*  HGB 8.5* 8.5* 8.2*  HCT 27.2* 27.9* 26.2*  MCV 132.7* 134.1* 130.3*  MCH 41.5* 40.9* 40.8*  MCHC 31.3 30.5 31.3  RDW 20.7* 20.6* 21.2*  PLT 195 207 252    Cardiac Enzymes Recent Labs  Lab 01/07/2019 1200 01/07/2019 1423 01/30/2019 2137 01/09/19 0330  TROPONINI <0.03 0.03* <0.03 <0.03   No results for input(s): TROPIPOC in the last  168 hours.   BNP Recent Labs  Lab 01/30/2019 1426  BNP 874.0*     DDimer  Recent Labs  Lab 01/09/2019 1426  DDIMER 0.28     Radiology    Dg Chest Port 1 View  Result Date: 01/11/2019 CLINICAL DATA:  Shortness of breath. EXAM: PORTABLE CHEST 1 VIEW COMPARISON:  01/10/2019 FINDINGS: Stable top-normal heart size. Tiny left apical pneumothorax remains which is stable to slightly decreased in size. Stable moderate to large right pleural effusion and small left pleural effusion. No overt pulmonary edema. IMPRESSION: Stable to slightly diminished small left apically pneumothorax. Stable moderate to large right pleural effusion and small left pleural effusion. Electronically Signed   By: Aletta Edouard M.D.   On: 01/11/2019 08:37   Dg Chest Port 1 View  Result Date: 01/10/2019 CLINICAL DATA:  78 year old female with history of thoracentesis today complicated by  pneumothorax. EXAM: PORTABLE CHEST 1 VIEW COMPARISON:  Chest x-ray 01/10/2019. FINDINGS: Stable appearing left-sided hydropneumothorax with very tiny left apical pneumothorax component occupying less than 5% of the volume of the left hemithorax, and moderate left pleural effusion. Moderate right pleural effusion is similar to the prior study. Bibasilar opacities may reflect areas of atelectasis and/or airspace consolidation. Cephalization of the pulmonary vasculature with indistinct interstitial markings, indicative of mild interstitial pulmonary edema. Mild cardiomegaly. Upper mediastinal contours are within normal limits. Aortic atherosclerosis. IMPRESSION: 1. Unchanged left-sided hydropneumothorax. 2. The appearance of the chest suggests congestive heart failure. 3. Aortic atherosclerosis. Electronically Signed   By: Vinnie Langton M.D.   On: 01/10/2019 18:54   Dg Chest Port 1 View  Result Date: 01/10/2019 CLINICAL DATA:  LEFT pleural effusion post thoracentesis EXAM: PORTABLE CHEST 1 VIEW COMPARISON:  Earlier study of 01/10/2019 at 0527 hours FINDINGS: Enlargement of cardiac silhouette with pulmonary vascular congestion. Minimal pulmonary edema, slightly improved. Bibasilar effusions and atelectasis, decreased on LEFT since earlier exam post thoracentesis. Small LEFT apically pneumothorax time without mediastinal shift. Osseous structures unremarkable. IMPRESSION: Small LEFT apical pneumothorax post thoracentesis. Findings called to Dr. Alfredia Ferguson on 01/10/2019 at 1420 hours. Follow-up chest radiograph will be ordered for 1800 hours this evening. Electronically Signed   By: Lavonia Dana M.D.   On: 01/10/2019 14:22   Dg Chest Port 1 View  Result Date: 01/10/2019 CLINICAL DATA:  Shortness of breath EXAM: PORTABLE CHEST 1 VIEW COMPARISON:  01/09/2019 FINDINGS: Cardiac shadow is stable. Bilateral pleural effusions and bibasilar infiltrates are again seen and stable. No new focal abnormality is noted. IMPRESSION:  Stable pleural effusions and basilar airspace opacities. Given some technical variations in the film the overall appearance is stable. Electronically Signed   By: Inez Catalina M.D.   On: 01/10/2019 07:53   US Thoracentesis Asp Pleural Space W/img Guide  Result Date: 01/10/2019 INDICATION: LEFT pleural effusion EXAM: ULTRASOUND GUIDED DIAGNOSTIC AND THERAPEUTIC LEFT THORACENTESIS MEDICATIONS: None. COMPLICATIONS: None immediate. PROCEDURE: Procedure, benefits, and risks of procedure were discussed with patient. Written informed consent for procedure was obtained. Time out protocol followed. Pleural effusion localized by ultrasound at the posterior LEFT hemithorax. Skin prepped and draped in usual sterile fashion. Skin and soft tissues anesthetized with 8 mL of 1% lidocaine. 8 French thoracentesis catheter placed into the LEFT pleural space. 1.2 L of yellow LEFT pleural fluid aspirated by syringe pump. Procedure tolerated well by patient without immediate complication. FINDINGS: A total of approximately 1.2 L of LEFT pleural fluid was removed. Samples were sent to the laboratory as requested by the clinical team. IMPRESSION:  Successful ultrasound guided LEFT thoracentesis yielding 1.2 L of pleural fluid. Electronically Signed   By: Lavonia Dana M.D.   On: 01/10/2019 14:15    Cardiac Studies   Echocardiogram 01/09/2019:  1. The left ventricle has normal systolic function with an ejection fraction of 60-65%. The cavity size was normal. Left ventricular diastolic function could not be evaluated secondary to atrial fibrillation.  2. The right ventricle has severely reduced systolic function. The cavity was moderately enlarged. There is no increase in right ventricular wall thickness. Right ventricular systolic pressure is moderately elevated with an estimated pressure of 49.1  mmHg.  3. Left atrial size was severely dilated.  4. Right atrial size was severely dilated.  5. Large pleural effusion.  6. Trivial  pericardial effusion is present.  7. No evidence of mitral valve stenosis.  8. No stenosis of the aortic valve.  9. The aortic root and ascending aorta are normal in size and structure. 10. The inferior vena cava was normal in size with <50% respiratory variability. 11. The interatrial septum was not assessed.  Patient Profile     78 y.o. female with a history of permanent atrial fibrillation, chronic diastolic heart failure, PAD, hypertension, polycythemia vera, and COPD with chronic hypoxic respiratory failure being managed for acute on chronic diastolic heart failure with rapid atrial fibrillation over baseline.  Assessment & Plan    1.  Permanent atrial fibrillation, recently with RVR in the setting of comorbid illnesses.  Heart rate control is better with combination of IV diltiazem and IV amiodarone.  2.  Acute on chronic diastolic heart failure, LVEF 60 to 65% by recent echocardiogram.  She continues to diurese, although output is not well recorded.  Weight stable at 162 pounds.  Currently on Lasix 60 mg IV twice daily.  3.  Acute renal insufficiency, creatinine up to 1.8.    4.  Pleural effusions. Now status post ultrasound-guided left thoracentesis with removal of 1.2 L fluid yesterday.  5.  Symptomatic anemia with heme positive stools and suspected GI bleed in the setting of supratherapeutic INR.  Coumadin has been on hold.  INR was 3.1.  6.  Severe RV dysfunction in association with chronic lung disease and moderate pulmonary hypertension.  Would reduce Lasix to 40 mg IV twice daily due to increasing creatinine.  Need complete intake and output recorded.  Otherwise continue IV diltiazem and IV amiodarone for heart rate control.  Signed, Rozann Lesches, MD  01/11/2019, 9:05 AM

## 2019-01-11 NOTE — Progress Notes (Signed)
Patient ate very little lunch and no dinner. Stated she just did not feel like eating.

## 2019-01-12 DIAGNOSIS — J939 Pneumothorax, unspecified: Secondary | ICD-10-CM

## 2019-01-12 DIAGNOSIS — J90 Pleural effusion, not elsewhere classified: Secondary | ICD-10-CM

## 2019-01-12 LAB — COMPREHENSIVE METABOLIC PANEL
ALT: 8 U/L (ref 0–44)
AST: 11 U/L — ABNORMAL LOW (ref 15–41)
Albumin: 2.8 g/dL — ABNORMAL LOW (ref 3.5–5.0)
Alkaline Phosphatase: 58 U/L (ref 38–126)
Anion gap: 14 (ref 5–15)
BUN: 69 mg/dL — ABNORMAL HIGH (ref 8–23)
CO2: 18 mmol/L — ABNORMAL LOW (ref 22–32)
Calcium: 8.1 mg/dL — ABNORMAL LOW (ref 8.9–10.3)
Chloride: 102 mmol/L (ref 98–111)
Creatinine, Ser: 2.02 mg/dL — ABNORMAL HIGH (ref 0.44–1.00)
GFR calc Af Amer: 27 mL/min — ABNORMAL LOW (ref >60)
GFR calc non Af Amer: 23 mL/min — ABNORMAL LOW (ref >60)
Glucose, Bld: 81 mg/dL (ref 70–99)
Potassium: 4.5 mmol/L (ref 3.5–5.1)
Sodium: 134 mmol/L — ABNORMAL LOW (ref 135–145)
Total Bilirubin: 1.6 mg/dL — ABNORMAL HIGH (ref 0.3–1.2)
Total Protein: 5.7 g/dL — ABNORMAL LOW (ref 6.5–8.1)

## 2019-01-12 LAB — CBC WITH DIFFERENTIAL/PLATELET
Abs Immature Granulocytes: 0.29 10*3/uL — ABNORMAL HIGH (ref 0.00–0.07)
Basophils Absolute: 0.1 10*3/uL (ref 0.0–0.1)
Basophils Relative: 1 %
Eosinophils Absolute: 0.2 10*3/uL (ref 0.0–0.5)
Eosinophils Relative: 2 %
HCT: 25.8 % — ABNORMAL LOW (ref 36.0–46.0)
Hemoglobin: 8.2 g/dL — ABNORMAL LOW (ref 12.0–15.0)
Immature Granulocytes: 3 %
Lymphocytes Relative: 6 %
Lymphs Abs: 0.6 10*3/uL — ABNORMAL LOW (ref 0.7–4.0)
MCH: 40.6 pg — ABNORMAL HIGH (ref 26.0–34.0)
MCHC: 31.8 g/dL (ref 30.0–36.0)
MCV: 127.7 fL — ABNORMAL HIGH (ref 80.0–100.0)
Monocytes Absolute: 0.4 10*3/uL (ref 0.1–1.0)
Monocytes Relative: 4 %
Neutro Abs: 8.1 10*3/uL — ABNORMAL HIGH (ref 1.7–7.7)
Neutrophils Relative %: 84 %
Platelets: 250 10*3/uL (ref 150–400)
RBC: 2.02 MIL/uL — ABNORMAL LOW (ref 3.87–5.11)
RDW: 20.8 % — ABNORMAL HIGH (ref 11.5–15.5)
WBC: 9.2 10*3/uL (ref 4.0–10.5)
nRBC: 0.2 % (ref 0.0–0.2)

## 2019-01-12 LAB — GLUCOSE, CAPILLARY: Glucose-Capillary: 86 mg/dL (ref 70–99)

## 2019-01-12 LAB — MAGNESIUM: Magnesium: 2.2 mg/dL (ref 1.7–2.4)

## 2019-01-12 LAB — PROTIME-INR
INR: 4.2 (ref 0.8–1.2)
Prothrombin Time: 39.7 seconds — ABNORMAL HIGH (ref 11.4–15.2)

## 2019-01-12 LAB — PHOSPHORUS: Phosphorus: 5.7 mg/dL — ABNORMAL HIGH (ref 2.5–4.6)

## 2019-01-12 MED ORDER — PHYTONADIONE 5 MG PO TABS
2.5000 mg | ORAL_TABLET | Freq: Once | ORAL | Status: AC
  Administered 2019-01-12: 2.5 mg via ORAL
  Filled 2019-01-12: qty 1

## 2019-01-12 NOTE — Progress Notes (Addendum)
PROGRESS NOTE  Charlotte [name]  TGY:[id number]  DOB: [birth year]  DOA: 01/10/2019 PCP: [name], MD  Brief Admission Hx: 78 year old female with chronic atrial fibrillation fully anticoagulated with warfarin, combined CHF, COPD, oxygen dependent, hypertension, hypothyroidism and hyperlipidemia, deafness, polycythemia vera and other multiple medical comorbidities presented with severe shortness of breath, uncontrolled heart rate A. fib RVR.  MDM/Assessment & Plan:   1. Large bilateral pleural effusions with significant atelectasis- patient is status post left thoracentesis with 1200 mL of fluid removed.  She likely needs procedure done on the right side when PT/INR is better controlled.   2. Acute on chronic respiratory failure with hypoxia -patient was volume overloaded and is status post thoracentesis with improvement in symptoms.  She remains in the stepdown unit.  CT of the chest revealed congestive heart failure and large pleural effusions.  SARS-CoV-2 test is negative.  Continue diuresis with IV Lasix.  Continue supportive therapy. 3. Atrial fibrillation with RVR-continue IV amiodarone and Cardizem per cardiology recommendations.  Appreciate the assistance of the heart care team.  Heart rate is better controlled at this time. 4. Small apical pneumothorax- status post thoracentesis.  Continue to monitor closely.  Vernard Gambles is asymptomatic at this time. 5. Hypothyroidism -Continue home levothyroxine. 6. Hyperlipidemia-continue pravastatin daily. 7. Acute combined CHF exacerbation- hold V Lasix today due to small bump in creatinine.  Continue to monitor intake and output closely.  Continue to monitor electrolytes closely.  Appreciate the assistance of the cardiology services. 8. Polycythemia vera - pt has been seen by hematologist Dr. Delton Coombes who has recommended temporarily holding hydroxyurea while recovering from critical illness.  9. Hyperbilirubinemia - Holding stable, GI aware and would  only be concerned if it rises above 3-4 range and /or transaminases bump.  10. GERD - continue protonix daily.   11. PVD - She is followed by Dr. [name] with vascular surgery and recently s/p amputation of toe.   DVT prophylaxis: warfarin Code Status: DNR Family Communication: telephone update to son  Disposition Plan: continue care in SDU    Consultants:  Cardiology  Hematology  Radiology  GI   Procedures:  Echocardiogram  ECHOCARDIOGRAM on 01/09/2019 IMPRESSIONS   1. The left ventricle has normal systolic function with an ejection fraction of 60-65%. The cavity size was normal. Left ventricular diastolic function could not be evaluated secondary to atrial fibrillation. 2. The right ventricle has severely reduced systolic function. The cavity was moderately enlarged. There is no increase in right ventricular wall thickness. Right ventricular systolic pressure is moderately elevated with an estimated pressure of 49.1  mmHg. 3. Left atrial size was severely dilated. 4. Right atrial size was severely dilated. 5. Large pleural effusion. 6. Trivial pericardial effusion is present. 7. No evidence of mitral valve stenosis. 8. No stenosis of the aortic valve. 9. The aortic root and ascending aorta are normal in size and structure. 10. The inferior vena cava was normal in size with <50% respiratory variability. 11. The interatrial septum was not assessed.  FINDINGS Left Ventricle: The left ventricle has normal systolic function, with an ejection fraction of 60-65%. The cavity size was normal. There is no increase in left ventricular wall thickness. Left ventricular diastolic function could not be evaluated  secondary to atrial fibrillation.  Right Ventricle: The right ventricle has severely reduced systolic function. The cavity was moderately enlarged. There is no increase in right ventricular wall thickness. Right ventricular systolic pressure is moderately elevated with  an estimated  pressure of 49.1 mmHg. Pacing  wire/catheter visualized in the right ventricle.  Left Atrium: Left atrial size was severely dilated.  Right Atrium: Right atrial size was severely dilated. Right atrial pressure is estimated at 3 mmHg.  Interatrial Septum: The interatrial septum was not assessed.  Pericardium: Trivial pericardial effusion is present. There is a large pleural effusion.  Mitral Valve: The mitral valve is normal in structure. Mitral valve regurgitation is trivial by color flow Doppler. No evidence of mitral valve stenosis.  Tricuspid Valve: The tricuspid valve is normal in structure. Tricuspid valve regurgitation is mild by color flow Doppler.  Aortic Valve: The aortic valve is normal in structure. Aortic valve regurgitation was not visualized by color flow Doppler. There is No stenosis of the aortic valve.  Pulmonic Valve: The pulmonic valve was grossly normal. Pulmonic valve regurgitation is trivial by color flow Doppler.  Aorta: The aortic root and ascending aorta are normal in size and structure.  Venous: The inferior vena cava is normal in size with less than 50% respiratory variability.  Antimicrobials:    Subjective: Patient says she is feeling much better today.  She is much less short of breath today.  She is eating and drinking and sitting up and not having chest pain or palpitations today.  Objective: Vitals:   01/12/19 0800 01/12/19 0900 01/12/19 1000 01/12/19 1121  BP: 104/60 105/63 98/64   Pulse: 92 (!) 102 95 (!) 101  Resp: (!) 22 19 19  (!) 36  Temp:    98.2 F (36.8 C)  TempSrc:    Oral  SpO2: 94% 95% 96% 97%  Weight:      Height:        Intake/Output Summary (Last 24 hours) at 01/12/2019 1247 Last data filed at 01/12/2019 0600 Gross per 24 hour  Intake 456.24 ml  Output 700 ml  Net -243.76 ml   Filed Weights   01/10/19 0500 01/11/19 0500 01/12/19 0500  Weight: 73.7 kg 73.5 kg 72.5 kg   REVIEW OF SYSTEMS  As per  history otherwise all reviewed and reported negative  Exam:  General exam: Chronically ill-appearing female she is sitting up in the bed she is awake and alert in no apparent distress.  She is cooperative. Respiratory system: Clear. No increased work of breathing. Cardiovascular system: S1 & S2 heard.  Irregularly irregular.  No JVD, murmurs, gallops, clicks or pedal edema. Gastrointestinal system: Abdomen is nondistended, soft and nontender. Normal bowel sounds heard. Central nervous system: Alert and oriented. No focal neurological deficits. Extremities: 1+ pretibial edema bilateral lower extremities.  Data Reviewed: Basic Metabolic Panel: Recent Labs  Lab 01/14/2019 1426 01/09/19 0331 01/09/19 1933 01/10/19 0515 01/10/19 0839 01/11/19 0555 01/12/19 0415  NA  --  136 136  --  135 136 134*  K  --  4.2 3.9  --  3.8 4.3 4.5  CL  --  108 104  --  105 106 102  CO2  --  21* 20*  --  21* 19* 18*  GLUCOSE  --  109* 97  --  95 74 81  BUN  --  53* 54*  --  55* 59* 69*  CREATININE  --  1.63* 1.66*  --  1.65* 1.80* 2.02*  CALCIUM  --  8.3* 8.4*  --  8.4* 8.2* 8.1*  MG 2.5* 2.3  --  2.1  --  2.2 2.2  PHOS 4.5 4.2  --  4.1  --  4.9* 5.7*   Liver Function Tests: Recent Labs  Lab 01/09/19 0331 01/09/19  1933 01/10/19 0839 01/11/19 0555 01/12/19 0415  AST 13* 13* 12* 11* 11*  ALT 9 9 7 8 8   ALKPHOS 65 64 60 54 58  BILITOT 1.6* 1.4* 1.5* 1.5* 1.6*  PROT 6.0* 6.3* 5.8* 5.6* 5.7*  ALBUMIN 3.3* 3.4* 3.1* 2.9* 2.8*   No results for input(s): LIPASE, AMYLASE in the last 168 hours. No results for input(s): AMMONIA in the last 168 hours. CBC: Recent Labs  Lab 01/04/2019 1200 01/03/2019 1426 01/09/19 0331 01/10/19 0515 01/11/19 0555 01/12/19 0415  WBC 6.0  --  5.9 5.4 5.6 9.2  NEUTROABS  --   --  4.6 4.3 4.8 8.1*  HGB 9.3*  --  8.5* 8.5* 8.2* 8.2*  HCT 29.4*  --  27.2* 27.9* 26.2* 25.8*  MCV 134.2*  --  132.7* 134.1* 130.3* 127.7*  PLT 248 200 195 207 252 250   Cardiac Enzymes:  Recent Labs  Lab 01/09/2019 1200 01/06/2019 1423 01/30/2019 2137 01/09/19 0330  TROPONINI <0.03 0.03* <0.03 <0.03   CBG (last 3)  Recent Labs    01/10/19 1126 01/11/19 0805 01/12/19 0729  GLUCAP 75 73 86   Recent Results (from the past 240 hour(s))  Blood culture (routine x 2)     Status: None (Preliminary result)   Collection Time: 01/18/2019 12:00 PM  Result Value Ref Range Status   Specimen Description BLOOD RIGHT ARM  Final   Special Requests   Final    Blood Culture adequate volume BOTTLES DRAWN AEROBIC AND ANAEROBIC   Culture   Final    NO GROWTH 4 DAYS Performed at [facility], [address], [place], [place] [address]    Report Status PENDING  Incomplete  SARS Coronavirus 2 (CEPHEID - Performed in [place] hospital lab), Hosp Order     Status: None   Collection Time: 01/30/2019 12:17 PM  Result Value Ref Range Status   SARS Coronavirus 2 NEGATIVE NEGATIVE Final    Comment: (NOTE) If result is NEGATIVE SARS-CoV-2 target nucleic acids are NOT DETECTED. The SARS-CoV-2 RNA is generally detectable in upper and lower  respiratory specimens during the acute phase of infection. The lowest  concentration of SARS-CoV-2 viral copies this assay can detect is 250  copies / mL. A negative result does not preclude SARS-CoV-2 infection  and should not be used as the sole basis for treatment or other  patient management decisions.  A negative result may occur with  improper specimen collection / handling, submission of specimen other  than nasopharyngeal swab, presence of viral mutation(s) within the  areas targeted by this assay, and inadequate number of viral copies  (<250 copies / mL). A negative result must be combined with clinical  observations, patient history, and epidemiological information. If result is POSITIVE SARS-CoV-2 target nucleic acids are DETECTED. The SARS-CoV-2 RNA is generally detectable in upper and lower  respiratory specimens dur ing the acute phase of  infection.  Positive  results are indicative of active infection with SARS-CoV-2.  Clinical  correlation with patient history and other diagnostic information is  necessary to determine patient infection status.  Positive results do  not rule out bacterial infection or co-infection with other viruses. If result is PRESUMPTIVE POSTIVE SARS-CoV-2 nucleic acids MAY BE PRESENT.   A presumptive positive result was obtained on the submitted specimen  and confirmed on repeat testing.  While 2019 novel coronavirus  (SARS-CoV-2) nucleic acids may be present in the submitted sample  additional confirmatory testing may be necessary for epidemiological  and / or clinical management purposes  to differentiate between  SARS-CoV-2 and other Sarbecovirus currently known to infect humans.  If clinically indicated additional testing with an alternate test  methodology [phone]) is advised. The SARS-CoV-2 RNA is generally  detectable in upper and lower respiratory sp ecimens during the acute  phase of infection. The expected result is Negative. Fact Sheet for Patients:  StrictlyIdeas.no Fact Sheet for Healthcare Providers: BankingDealers.co.za This test is not yet approved or cleared by the [place] FDA and has been authorized for detection and/or diagnosis of SARS-CoV-2 by FDA under an Emergency Use Authorization (EUA).  This EUA will remain in effect (meaning this test can be used) for the duration of the COVID-19 declaration under Section 564(b)(1) of the Act, 21 U.S.C. section 360bbb-3(b)(1), unless the authorization is terminated or revoked sooner. Performed at [facility], [address] [place], [place] [address]   Blood culture (routine x 2)     Status: None (Preliminary result)   Collection Time: 01/22/2019 12:46 PM  Result Value Ref Range Status   Specimen Description LEFT ANTECUBITAL  Final   Special Requests   Final    BOTTLES DRAWN  AEROBIC AND ANAEROBIC Blood Culture adequate volume   Culture   Final    NO GROWTH 4 DAYS Performed at [facility], [address] [place], [place] [address]    Report Status PENDING  Incomplete  MRSA PCR Screening     Status: None   Collection Time: 01/16/2019  7:57 PM  Result Value Ref Range Status   MRSA by PCR NEGATIVE NEGATIVE Final    Comment:        The GeneXpert MRSA Assay (FDA approved for NASAL specimens only), is one component of a comprehensive MRSA colonization surveillance program. It is not intended to diagnose MRSA infection nor to guide or monitor treatment for MRSA infections. Performed at [facility], [address] [place], [place] [address]   Fungus Culture With Stain     Status: None (Preliminary result)   Collection Time: 01/10/19 12:42 PM  Result Value Ref Range Status   Fungus Stain Final report  Final    Comment: (NOTE) Performed At: [facility] [address] [place], [place] [id number] Rush Farmer MD [id number]    Fungus (Mycology) Culture PENDING  Incomplete   Fungal Source PLEURAL  Final    Comment: Performed at [facility], [address], [place], [place] [address]  Fungus Culture Result     Status: None   Collection Time: 01/10/19 12:42 PM  Result Value Ref Range Status   Result 1 Comment  Final    Comment: (NOTE) KOH/Calcofluor preparation:  no fungus observed. Performed At: [facility] [place], [place] [id number] Rush Farmer MD [id number]   Gram stain     Status: None   Collection Time: 01/10/19 12:43 PM  Result Value Ref Range Status   Specimen Description PLEURAL  Final   Special Requests NONE  Final   Gram Stain   Final    CYTOSPIN SMEAR NO ORGANISMS SEEN WBC PRESENT,BOTH PMN AND MONONUCLEAR Performed at [facility], [address], [place],  [address]    Report Status 01/10/2019 FINAL  Final     Studies: Dg Chest Port 1 View  Result Date: 01/11/2019 CLINICAL DATA:   Shortness of breath. EXAM: PORTABLE CHEST 1 VIEW COMPARISON:  01/10/2019 FINDINGS: Stable top-normal heart size. Tiny left apical pneumothorax remains which is stable to slightly decreased in size. Stable moderate  to large right pleural effusion and small left pleural effusion. No overt pulmonary edema. IMPRESSION: Stable to slightly diminished small left apically pneumothorax. Stable moderate to large right pleural effusion and small left pleural effusion. Electronically Signed   By: Aletta Edouard M.D.   On: 01/11/2019 08:37   Dg Chest Port 1 View  Result Date: 01/10/2019 CLINICAL DATA:  78 year old female with history of thoracentesis today complicated by pneumothorax. EXAM: PORTABLE CHEST 1 VIEW COMPARISON:  Chest x-ray 01/10/2019. FINDINGS: Stable appearing left-sided hydropneumothorax with very tiny left apical pneumothorax component occupying less than 5% of the volume of the left hemithorax, and moderate left pleural effusion. Moderate right pleural effusion is similar to the prior study. Bibasilar opacities may reflect areas of atelectasis and/or airspace consolidation. Cephalization of the pulmonary vasculature with indistinct interstitial markings, indicative of mild interstitial pulmonary edema. Mild cardiomegaly. Upper mediastinal contours are within normal limits. Aortic atherosclerosis. IMPRESSION: 1. Unchanged left-sided hydropneumothorax. 2. The appearance of the chest suggests congestive heart failure. 3. Aortic atherosclerosis. Electronically Signed   By: Vinnie Langton M.D.   On: 01/10/2019 18:54   Dg Chest Port 1 View  Result Date: 01/10/2019 CLINICAL DATA:  LEFT pleural effusion post thoracentesis EXAM: PORTABLE CHEST 1 VIEW COMPARISON:  Earlier study of 01/10/2019 at 0527 hours FINDINGS: Enlargement of cardiac silhouette with pulmonary vascular congestion. Minimal pulmonary edema, slightly improved. Bibasilar effusions and atelectasis, decreased on LEFT since earlier exam post  thoracentesis. Small LEFT apically pneumothorax time without mediastinal shift. Osseous structures unremarkable. IMPRESSION: Small LEFT apical pneumothorax post thoracentesis. Findings called to Dr. Alfredia Ferguson on 01/10/2019 at 1420 hours. Follow-up chest radiograph will be ordered for 1800 hours this evening. Electronically Signed   By: Lavonia Dana M.D.   On: 01/10/2019 14:22   US Thoracentesis Asp Pleural Space W/img Guide  Result Date: 01/10/2019 INDICATION: LEFT pleural effusion EXAM: ULTRASOUND GUIDED DIAGNOSTIC AND THERAPEUTIC LEFT THORACENTESIS MEDICATIONS: None. COMPLICATIONS: None immediate. PROCEDURE: Procedure, benefits, and risks of procedure were discussed with patient. Written informed consent for procedure was obtained. Time out protocol followed. Pleural effusion localized by ultrasound at the posterior LEFT hemithorax. Skin prepped and draped in usual sterile fashion. Skin and soft tissues anesthetized with 8 mL of 1% lidocaine. 8 French thoracentesis catheter placed into the LEFT pleural space. 1.2 L of yellow LEFT pleural fluid aspirated by syringe pump. Procedure tolerated well by patient without immediate complication. FINDINGS: A total of approximately 1.2 L of LEFT pleural fluid was removed. Samples were sent to the laboratory as requested by the clinical team. IMPRESSION: Successful ultrasound guided LEFT thoracentesis yielding 1.2 L of pleural fluid. Electronically Signed   By: Lavonia Dana M.D.   On: 01/10/2019 14:15   Scheduled Meds: . Chlorhexidine Gluconate Cloth  6 each Topical Daily  . gabapentin  100 mg Oral TID  . guaiFENesin  600 mg Oral BID  . ipratropium  0.5 mg Nebulization TID  . levalbuterol  0.63 mg Nebulization TID  . levothyroxine  125 mcg Oral Q0600  . mouth rinse  15 mL Mouth Rinse BID  . pantoprazole  40 mg Oral Daily  . pravastatin  10 mg Oral q1800  . Warfarin - Pharmacist Dosing Inpatient   Does not apply q1800   Continuous Infusions: . amiodarone 30 mg/hr  (01/12/19 0600)  . diltiazem (CARDIZEM) infusion 7.5 mg/hr (01/12/19 0600)    Active Problems:   Polycythemia vera (HCC)   Chronic atrial fibrillation   Anticoagulant long-term use   HTN (hypertension)  Hypothyroidism   PVD (peripheral vascular disease) (HCC)   SOB (shortness of breath)   Hypoxemia   CHF (congestive heart failure) (HCC)   Acute on chronic respiratory failure with hypoxia (HCC)   Anemia   Chronic respiratory failure with hypoxia (HCC)   Hyperlipidemia   Acute renal failure (ARF) (HCC)   GERD (gastroesophageal reflux disease)   Iron deficiency anemia due to chronic blood loss   Atrial fibrillation with RVR ([place])  Critical CareTime spent: 35 minutes   Irwin Brakeman, MD [facility] 01/12/2019, 12:47 PM    LOS: 4 days  How to contact the [facility] Attending or Consulting provider [place] or covering provider during after hours [place], for this patient?  1. Check the care team in [facility] and look for a) attending/consulting [facility] provider listed and b) the [facility] team listed 2. Log into [email or web address] and use [place] universal password to access. If you do not have the password, please contact the hospital operator. 3. Locate the [facility] provider you are looking for under [facility] and page to a number that you can be directly reached. 4. If you still have difficulty reaching the provider, please page the [facility] (Director on Call) for the Hospitalists listed on amion for assistance.

## 2019-01-12 NOTE — Care Management Important Message (Signed)
Important Message  Patient Details  Name: Charlotte [name] MRN: [id number] Date of Birth: [birth year]   Medicare Important Message Given:  Yes    Tommy Medal 01/12/2019, 1:01 PM

## 2019-01-12 NOTE — Progress Notes (Signed)
Progress Note  Patient Name: Charlotte [name] Date of Encounter: 01/12/2019  Primary Cardiologist: Kate Sable, MD  Subjective   States that she feels better, less short of breath.  She reports no chest pain or palpitations.  Inpatient Medications    Scheduled Meds: . Chlorhexidine Gluconate Cloth  6 each Topical Daily  . furosemide  40 mg Intravenous BID  . gabapentin  100 mg Oral TID  . guaiFENesin  600 mg Oral BID  . ipratropium  0.5 mg Nebulization TID  . levalbuterol  0.63 mg Nebulization TID  . levothyroxine  125 mcg Oral Q0600  . mouth rinse  15 mL Mouth Rinse BID  . pantoprazole  40 mg Oral Daily  . pravastatin  10 mg Oral q1800  . Warfarin - Pharmacist Dosing Inpatient   Does not apply q1800   Continuous Infusions: . amiodarone 30 mg/hr (01/12/19 0600)  . diltiazem (CARDIZEM) infusion 7.5 mg/hr (01/12/19 0600)   PRN Meds: acetaminophen **OR** acetaminophen, bisacodyl, ondansetron **OR** ondansetron (ZOFRAN) IV, senna-docusate, traMADol   Vital Signs    Vitals:   01/12/19 0500 01/12/19 0600 01/12/19 0700 01/12/19 0730  BP: 101/68 102/60 104/70   Pulse: 96 (!) 103 91 100  Resp: (!) 23 (!) 21 (!) 21 (!) 21  Temp:    98.2 F (36.8 C)  TempSrc:    Oral  SpO2: 96% 93% 94% 95%  Weight: 72.5 kg     Height:        Intake/Output Summary (Last 24 hours) at 01/12/2019 0842 Last data filed at 01/12/2019 0600 Gross per 24 hour  Intake 782.01 ml  Output 1200 ml  Net -417.99 ml   Filed Weights   01/10/19 0500 01/11/19 0500 01/12/19 0500  Weight: 73.7 kg 73.5 kg 72.5 kg    Telemetry    Probable atypical atrial flutter with controlled ventricular response.  Personally reviewed.  Physical Exam   GEN:  Elderly woman, no acute distress.   Neck: No JVD. Cardiac:  Irregularly irregular, no gallop.  Respiratory: Nonlabored at rest.  Decreased breath sounds with egophony right side, somewhat less on the left.  No wheezing. GI: Soft, nontender, bowel  sounds present. MS:  Protective boots on feet and lower legs; No deformity. Neuro:  Nonfocal. Psych: Alert and oriented x 3. Normal affect.  Labs    Chemistry Recent Labs  Lab 01/10/19 0839 01/11/19 0555 01/12/19 0415  NA 135 136 134*  K 3.8 4.3 4.5  CL 105 106 102  CO2 21* 19* 18*  GLUCOSE 95 74 81  BUN 55* 59* 69*  CREATININE 1.65* 1.80* 2.02*  CALCIUM 8.4* 8.2* 8.1*  PROT 5.8* 5.6* 5.7*  ALBUMIN 3.1* 2.9* 2.8*  AST 12* 11* 11*  ALT 7 8 8   ALKPHOS 60 54 58  BILITOT 1.5* 1.5* 1.6*  GFRNONAA 30* 27* 23*  GFRAA 35* 31* 27*  ANIONGAP 9 11 14      Hematology Recent Labs  Lab 01/10/19 0515 01/11/19 0555 01/12/19 0415  WBC 5.4 5.6 9.2  RBC 2.08* 2.01*  2.04* 2.02*  HGB 8.5* 8.2* 8.2*  HCT 27.9* 26.2* 25.8*  MCV 134.1* 130.3* 127.7*  MCH 40.9* 40.8* 40.6*  MCHC 30.5 31.3 31.8  RDW 20.6* 21.2* 20.8*  PLT 207 252 250    Cardiac Enzymes Recent Labs  Lab 01/31/2019 1200 01/12/2019 1423 01/13/2019 2137 01/09/19 0330  TROPONINI <0.03 0.03* <0.03 <0.03   No results for input(s): TROPIPOC in the last 168 hours.   BNP Recent Labs  Lab 02/01/2019 1426  BNP 874.0*     DDimer  Recent Labs  Lab 01/03/2019 1426  DDIMER 0.28     Radiology    Dg Chest Port 1 View  Result Date: 01/11/2019 CLINICAL DATA:  Shortness of breath. EXAM: PORTABLE CHEST 1 VIEW COMPARISON:  01/10/2019 FINDINGS: Stable top-normal heart size. Tiny left apical pneumothorax remains which is stable to slightly decreased in size. Stable moderate to large right pleural effusion and small left pleural effusion. No overt pulmonary edema. IMPRESSION: Stable to slightly diminished small left apically pneumothorax. Stable moderate to large right pleural effusion and small left pleural effusion. Electronically Signed   By: Aletta Edouard M.D.   On: 01/11/2019 08:37   Dg Chest Port 1 View  Result Date: 01/10/2019 CLINICAL DATA:  78 year old female with history of thoracentesis today complicated by  pneumothorax. EXAM: PORTABLE CHEST 1 VIEW COMPARISON:  Chest x-ray 01/10/2019. FINDINGS: Stable appearing left-sided hydropneumothorax with very tiny left apical pneumothorax component occupying less than 5% of the volume of the left hemithorax, and moderate left pleural effusion. Moderate right pleural effusion is similar to the prior study. Bibasilar opacities may reflect areas of atelectasis and/or airspace consolidation. Cephalization of the pulmonary vasculature with indistinct interstitial markings, indicative of mild interstitial pulmonary edema. Mild cardiomegaly. Upper mediastinal contours are within normal limits. Aortic atherosclerosis. IMPRESSION: 1. Unchanged left-sided hydropneumothorax. 2. The appearance of the chest suggests congestive heart failure. 3. Aortic atherosclerosis. Electronically Signed   By: Vinnie Langton M.D.   On: 01/10/2019 18:54   Dg Chest Port 1 View  Result Date: 01/10/2019 CLINICAL DATA:  LEFT pleural effusion post thoracentesis EXAM: PORTABLE CHEST 1 VIEW COMPARISON:  Earlier study of 01/10/2019 at 0527 hours FINDINGS: Enlargement of cardiac silhouette with pulmonary vascular congestion. Minimal pulmonary edema, slightly improved. Bibasilar effusions and atelectasis, decreased on LEFT since earlier exam post thoracentesis. Small LEFT apically pneumothorax time without mediastinal shift. Osseous structures unremarkable. IMPRESSION: Small LEFT apical pneumothorax post thoracentesis. Findings called to Dr. Alfredia Ferguson on 01/10/2019 at 1420 hours. Follow-up chest radiograph will be ordered for 1800 hours this evening. Electronically Signed   By: Lavonia Dana M.D.   On: 01/10/2019 14:22   US Thoracentesis Asp Pleural Space W/img Guide  Result Date: 01/10/2019 INDICATION: LEFT pleural effusion EXAM: ULTRASOUND GUIDED DIAGNOSTIC AND THERAPEUTIC LEFT THORACENTESIS MEDICATIONS: None. COMPLICATIONS: None immediate. PROCEDURE: Procedure, benefits, and risks of procedure were discussed  with patient. Written informed consent for procedure was obtained. Time out protocol followed. Pleural effusion localized by ultrasound at the posterior LEFT hemithorax. Skin prepped and draped in usual sterile fashion. Skin and soft tissues anesthetized with 8 mL of 1% lidocaine. 8 French thoracentesis catheter placed into the LEFT pleural space. 1.2 L of yellow LEFT pleural fluid aspirated by syringe pump. Procedure tolerated well by patient without immediate complication. FINDINGS: A total of approximately 1.2 L of LEFT pleural fluid was removed. Samples were sent to the laboratory as requested by the clinical team. IMPRESSION: Successful ultrasound guided LEFT thoracentesis yielding 1.2 L of pleural fluid. Electronically Signed   By: Lavonia Dana M.D.   On: 01/10/2019 14:15    Cardiac Studies   Echocardiogram 01/09/2019: 1. The left ventricle has normal systolic function with an ejection fraction of 60-65%. The cavity size was normal. Left ventricular diastolic function could not be evaluated secondary to atrial fibrillation. 2. The right ventricle has severely reduced systolic function. The cavity was moderately enlarged. There is no increase in right ventricular wall thickness. Right  ventricular systolic pressure is moderately elevated with an estimated pressure of 49.1  mmHg. 3. Left atrial size was severely dilated. 4. Right atrial size was severely dilated. 5. Large pleural effusion. 6. Trivial pericardial effusion is present. 7. No evidence of mitral valve stenosis. 8. No stenosis of the aortic valve. 9. The aortic root and ascending aorta are normal in size and structure. 10. The inferior vena cava was normal in size with <50% respiratory variability. 11. The interatrial septum was not assessed.  Patient Profile     78 y.o. female with a history of permanent atrial fibrillation, chronic diastolic heart failure, PAD, hypertension, polycythemia vera,andCOPD with chronic hypoxic  respiratory failure being managed for acute on chronic diastolic heart failure with rapid atrial fibrillation over baseline.  Assessment & Plan    1.  Permanent atrial fibrillation, recently with RVR in the setting of comorbid illnesses.  Heart rate control is much better, generally 90s to low 100s on combination of IV diltiazem and IV amiodarone.  2.  Acute on chronic diastolic heart failure, recent LVEF 60 to 65% by echocardiogram.  Intake and output incomplete but net output noted.  Weight is down to 159 pounds.  3.  Acute renal failure, creatinine up from 1.8-2.0.  4.  Bilateral pleural effusions, right greater than left.  She underwent left ultrasound-guided thoracentesis with removal of 1.2 L of fluid already.  5.  Symptomatic anemia with heme positive stools likely due to GI bleed in the setting of supratherapeutic INR.  Coumadin has been held.  INR 4.2.  LFTs are normal.  Also has elevated MCV of 127 - question nutritional deficiencies.  6.  Severe RV dysfunction in association with chronic lung disease and moderate pulmonary hypertension.  Would hold Lasix today.  Continue IV amiodarone and diltiazem for heart rate control.  Will give a single dose of vitamin K.  She would likely benefit from a right-sided thoracentesis eventually once INR has decreased somewhat further.  Signed, Rozann Lesches, MD  01/12/2019, 8:42 AM

## 2019-01-12 NOTE — Progress Notes (Signed)
ANTICOAGULATION CONSULT NOTE -   Pharmacy Consult for warfarin Indication: atrial fibrillation  No Known Allergies  Patient Measurements: Height: 6' (182.9 cm) Weight: 159 lb 13.3 oz (72.5 kg) IBW/kg (Calculated) : 73.1   Vital Signs: Temp: 98.2 F (36.8 C) (06/10 0730) Temp Source: Oral (06/10 0730) BP: 104/70 (06/10 0700) Pulse Rate: 100 (06/10 0730)  Labs: Recent Labs    01/10/19 0515 01/10/19 0839 01/11/19 0555 01/11/19 1151 01/12/19 0415  HGB 8.5*  --  8.2*  --  8.2*  HCT 27.9*  --  26.2*  --  25.8*  PLT 207  --  252  --  250  LABPROT  --  31.3*  --  31.6* 39.7*  INR  --  3.1*  --  3.1* 4.2*  CREATININE  --  1.65* 1.80*  --  2.02*    Estimated Creatinine Clearance: 27.1 mL/min (A) (by C-G formula based on SCr of 2.02 mg/dL (H)).  Assessment: Pharmacy consulted to dose warfarin for this 78 yo female with atrial fibrillation on chronic anti-coagulation with warfarin.  Her home dose of warfarin is 2.5mg  on Sun/Tues/Thurs  and 5mg  on Mon/Wed/Fri/Sat.  INR remains high at  at 4.2.   Goal of Therapy:  INR 2-3 Monitor platelets by anticoagulation protocol: Yes   Plan:  Hold warfarin today for INR 4.2 Monitor daily INR and s/s of bleeding.  Despina Pole, Pharm. D. Clinical Pharmacist 01/12/2019 8:36 AM

## 2019-01-12 NOTE — Progress Notes (Signed)
Physical Therapy Treatment Patient Details Name: Charlotte [name] MRN: [id number] DOB: 09/22/1940 Today's Date: 01/12/2019    History of Present Illness [name] is a 78 y.o. female with medical history significant for but not limited to chronic atrial fibrillation on anticoagulation with Coumadin, history of right leg cellulitis, history of combined CHF, history of COPD chronically on 4 L of home O2, anxiety and depression, essential hypertension, history of DVT, hyperlipidemia, hypothyroidism, history of iron deficiency anemia due to chronic blood loss, deafness and extremely hard of hearing, peripheral neuropathy, peripheral vascular disease, polycythemia vera, and other multiple medical comorbidities who presents with a 3 to 4-day history of dyspnea is progressively gotten worse    PT Comments    Pt willing to work with therapist, however declined getting into a chair this afternoon.  Completed AROM for hip abd/heelslides bilaterally, ankle pumps on Lt and PROM on Rt ankle.  Pt noted to be soiled so worked on [employer] along with nursing as she was cleaned and linens were changed.  Pt required cues for UE use/placement with bedmobilty and assist to maintain sidelying posturing for extended amount of time.  Overall, did well in therapy today.   Remained in supine positioning.    Follow Up Recommendations  SNF     Equipment Recommendations  None recommended by PT    Recommendations for Other Services       Precautions / Restrictions Precautions Precautions: Fall Precaution Comments: Monitor HR, PVC, and oxygen stats Restrictions Weight Bearing Restrictions: No    Mobility  Bed Mobility Overal bed mobility: Needs Assistance Bed Mobility: Rolling Rolling: Min guard         General bed mobility comments: rolling Rt/Lt definate use of bedrails and therapist assist to maintain extended time in position,  Cues for UE assist and use  Transfers                     Ambulation/Gait                 Stairs             Wheelchair Mobility    Modified Rankin (Stroke Patients Only)       Balance                                            Cognition Arousal/Alertness: Awake/alert Behavior During Therapy: WFL for tasks assessed/performed Overall Cognitive Status: Within Functional Limits for tasks assessed                                        Exercises General Exercises - Lower Extremity Ankle Circles/Pumps: PROM;AROM;Right;Left;10 reps Heel Slides: AROM;Right;Left;10 reps Hip ABduction/ADduction: AROM;Right;Left;10 reps    General Comments        Pertinent Vitals/Pain Pain Assessment: No/denies pain    Home Living                      Prior Function            PT Goals (current goals can now be found in the care plan section) Acute Rehab PT Goals Patient Stated Goal: return home    Frequency    Min 3X/week      PT Plan  Co-evaluation              AM-PAC PT "6 Clicks" Mobility   Outcome Measure  Help needed turning from your back to your side while in a flat bed without using bedrails?: A Little Help needed moving from lying on your back to sitting on the side of a flat bed without using bedrails?: A Little Help needed moving to and from a bed to a chair (including a wheelchair)?: A Lot Help needed standing up from a chair using your arms (e.g., wheelchair or bedside chair)?: A Little Help needed to walk in hospital room?: Total Help needed climbing 3-5 steps with a railing? : Total 6 Click Score: 13    End of Session Equipment Utilized During Treatment: Oxygen Activity Tolerance: Patient tolerated treatment well;Patient limited by fatigue Patient left: in bed;with call bell/phone within reach Nurse Communication: Mobility status PT Visit Diagnosis: Unsteadiness on feet (R26.81);Other abnormalities of gait and mobility (R26.89);Muscle  weakness (generalized) (M62.81)     Time: 1525-1600 PT Time Calculation (min) (ACUTE ONLY): 35 min  Charges:  $Therapeutic Exercise: 8-22 mins $Therapeutic Activity: 8-22 mins                     Teena Irani, PTA/CLT [place], Yenty Bloch B 01/12/2019, 4:18 PM

## 2019-01-13 ENCOUNTER — Inpatient Hospital Stay (HOSPITAL_COMMUNITY): Payer: Medicare HMO

## 2019-01-13 DIAGNOSIS — R0602 Shortness of breath: Secondary | ICD-10-CM

## 2019-01-13 DIAGNOSIS — Z9889 Other specified postprocedural states: Secondary | ICD-10-CM

## 2019-01-13 LAB — CBC
HCT: 25.1 % — ABNORMAL LOW (ref 36.0–46.0)
Hemoglobin: 8 g/dL — ABNORMAL LOW (ref 12.0–15.0)
MCH: 41 pg — ABNORMAL HIGH (ref 26.0–34.0)
MCHC: 31.9 g/dL (ref 30.0–36.0)
MCV: 128.7 fL — ABNORMAL HIGH (ref 80.0–100.0)
Platelets: 247 10*3/uL (ref 150–400)
RBC: 1.95 MIL/uL — ABNORMAL LOW (ref 3.87–5.11)
RDW: 21.5 % — ABNORMAL HIGH (ref 11.5–15.5)
WBC: 12 10*3/uL — ABNORMAL HIGH (ref 4.0–10.5)
nRBC: 0.2 % (ref 0.0–0.2)

## 2019-01-13 LAB — CULTURE, BLOOD (ROUTINE X 2)
Culture: NO GROWTH
Culture: NO GROWTH
Special Requests: ADEQUATE
Special Requests: ADEQUATE

## 2019-01-13 LAB — PROTIME-INR
INR: 1.9 — ABNORMAL HIGH (ref 0.8–1.2)
Prothrombin Time: 21.9 seconds — ABNORMAL HIGH (ref 11.4–15.2)

## 2019-01-13 LAB — GLUCOSE, CAPILLARY: Glucose-Capillary: 103 mg/dL — ABNORMAL HIGH (ref 70–99)

## 2019-01-13 LAB — BASIC METABOLIC PANEL
Anion gap: 9 (ref 5–15)
BUN: 77 mg/dL — ABNORMAL HIGH (ref 8–23)
CO2: 22 mmol/L (ref 22–32)
Calcium: 8.2 mg/dL — ABNORMAL LOW (ref 8.9–10.3)
Chloride: 103 mmol/L (ref 98–111)
Creatinine, Ser: 2.24 mg/dL — ABNORMAL HIGH (ref 0.44–1.00)
GFR calc Af Amer: 24 mL/min — ABNORMAL LOW (ref >60)
GFR calc non Af Amer: 21 mL/min — ABNORMAL LOW (ref >60)
Glucose, Bld: 103 mg/dL — ABNORMAL HIGH (ref 70–99)
Potassium: 4.3 mmol/L (ref 3.5–5.1)
Sodium: 134 mmol/L — ABNORMAL LOW (ref 135–145)

## 2019-01-13 MED ORDER — WARFARIN SODIUM 2 MG PO TABS
4.0000 mg | ORAL_TABLET | Freq: Once | ORAL | Status: DC
  Filled 2019-01-13: qty 2

## 2019-01-13 NOTE — Progress Notes (Signed)
Progress Note  Patient Name: Charlotte [name] Date of Encounter: 01/13/2019  Primary Cardiologist: Kate Sable, MD  Subjective   Looks more short of breath today and fatigued.  Currently getting a breathing treatment.  She does not report any chest pain or palpitations, no abdominal pain.  Inpatient Medications    Scheduled Meds: . Chlorhexidine Gluconate Cloth  6 each Topical Daily  . gabapentin  100 mg Oral TID  . guaiFENesin  600 mg Oral BID  . ipratropium  0.5 mg Nebulization TID  . levalbuterol  0.63 mg Nebulization TID  . levothyroxine  125 mcg Oral Q0600  . mouth rinse  15 mL Mouth Rinse BID  . pantoprazole  40 mg Oral Daily  . pravastatin  10 mg Oral q1800  . Warfarin - Pharmacist Dosing Inpatient   Does not apply q1800   Continuous Infusions: . amiodarone 30 mg/hr (01/13/19 0354)  . diltiazem (CARDIZEM) infusion 5 mg/hr (01/13/19 0353)   PRN Meds: acetaminophen **OR** acetaminophen, bisacodyl, ondansetron **OR** ondansetron (ZOFRAN) IV, senna-docusate, traMADol   Vital Signs    Vitals:   01/13/19 0719 01/13/19 0800 01/13/19 0818 01/13/19 0828  BP:  103/70    Pulse: 94 100  97  Resp: (!) 30 20  (!) 26  Temp: 98.6 F (37 C)     TempSrc: Oral     SpO2: 93% 96% 94% 97%  Weight:      Height:        Intake/Output Summary (Last 24 hours) at 01/13/2019 0839 Last data filed at 01/13/2019 0403 Gross per 24 hour  Intake 409.05 ml  Output 700 ml  Net -290.95 ml   Filed Weights   01/11/19 0500 01/12/19 0500 01/13/19 0403  Weight: 73.5 kg 72.5 kg 73.4 kg    Telemetry    Atrial fibrillation.  Personally reviewed.  Physical Exam   GEN:  Chronic ill-appearing elderly woman, mildly short of breath. Neck: No JVD. Cardiac:  Irregularly irregular, no gallop.  Respiratory:  Decreased breath sounds and egophony on the right side, somewhat less on the left, no active wheezing.Marland Kitchen GI: Soft, nontender, bowel sounds present. MS:  Protective boots and SCDs on  lower legs; No deformity. Neuro:  Nonfocal.  Hearing loss evident. Psych: Alert and oriented x 3. Normal affect.  Labs    Chemistry Recent Labs  Lab 01/10/19 [id number] 01/11/19 0555 01/12/19 0415 01/13/19 0436  NA 135 136 134* 134*  K 3.8 4.3 4.5 4.3  CL 105 106 102 103  CO2 21* 19* 18* 22  GLUCOSE 95 74 81 103*  BUN 55* 59* 69* 77*  CREATININE 1.65* 1.80* 2.02* 2.24*  CALCIUM 8.4* 8.2* 8.1* 8.2*  PROT 5.8* 5.6* 5.7*  --   ALBUMIN 3.1* 2.9* 2.8*  --   AST 12* 11* 11*  --   ALT 7 8 8   --   ALKPHOS 60 54 58  --   BILITOT 1.5* 1.5* 1.6*  --   GFRNONAA 30* 27* 23* 21*  GFRAA 35* 31* 27* 24*  ANIONGAP 9 11 14 9      Hematology Recent Labs  Lab 01/11/19 0555 01/12/19 0415 01/13/19 0436  WBC 5.6 9.2 12.0*  RBC 2.01*  2.04* 2.02* 1.95*  HGB 8.2* 8.2* 8.0*  HCT 26.2* 25.8* 25.1*  MCV 130.3* 127.7* 128.7*  MCH 40.8* 40.6* 41.0*  MCHC 31.3 31.8 31.9  RDW 21.2* 20.8* 21.5*  PLT 252 250 247    Cardiac Enzymes Recent Labs  Lab 02/01/2019 1200 01/20/2019 1423  01/28/2019 2137 01/09/19 0330  TROPONINI <0.03 0.03* <0.03 <0.03   No results for input(s): TROPIPOC in the last 168 hours.   BNP Recent Labs  Lab 01/19/2019 1426  BNP 874.0*     DDimer  Recent Labs  Lab 02/01/2019 1426  DDIMER 0.28     Radiology    No results found.  Cardiac Studies   Echocardiogram 01/09/2019: 1. The left ventricle has normal systolic function with an ejection fraction of 60-65%. The cavity size was normal. Left ventricular diastolic function could not be evaluated secondary to atrial fibrillation. 2. The right ventricle has severely reduced systolic function. The cavity was moderately enlarged. There is no increase in right ventricular wall thickness. Right ventricular systolic pressure is moderately elevated with an estimated pressure of 49.1  mmHg. 3. Left atrial size was severely dilated. 4. Right atrial size was severely dilated. 5. Large pleural effusion. 6. Trivial pericardial  effusion is present. 7. No evidence of mitral valve stenosis. 8. No stenosis of the aortic valve. 9. The aortic root and ascending aorta are normal in size and structure. 10. The inferior vena cava was normal in size with <50% respiratory variability. 11. The interatrial septum was not assessed.  Patient Profile     78 y.o. female with a history of permanent atrial fibrillation, chronic diastolic heart failure, PAD, hypertension, polycythemia vera,andCOPD with chronic hypoxic respiratory failurebeing managed for acute on chronic diastolic heart failure with rapid atrial fibrillation over baseline.  Assessment & Plan    1.  Permanent atrial fibrillation with recent RVR in the setting of active comorbid illnesses.  Heart rate control is better on combination of IV diltiazem and IV amiodarone (temporary).  Coumadin held with recent supratherapeutic INR.  2.  Acute on chronic diastolic heart failure, LVEF 60 to 65% by recent evaluation.  Still has evidence of fluid overload, however diuretics have been held with progressive acute renal failure.  3.  Bilateral pleural effusions, right greater than left.  She underwent left sided ultrasound-guided thoracentesis already with removal of 1.2 L.  Still large effusion on the right.  4.  Acute renal failure, creatinine up to 2.2.  Diuretics have been held.  5.  Symptomatic anemia with heme positive stools likely secondary to GI bleed in the setting of supratherapeutic INR.  Coumadin already held, she received a low-dose of vitamin K yesterday, INR is down to 1.9 from 4.2.  6.  Severe RV dysfunction in association with chronic lung disease and moderate pulmonary hypertension.  Patient looks somewhat worse today.  Heart rate however remains adequately controlled given the circumstances.  I would continue to hold Lasix in light of progressive renal insufficiency, may need to involve nephrology if this trend continues.  She is now subtherapeutic in  terms of INR, may want to consider a right-sided thoracentesis at this point.  Follow-up ECG in a.m., no change in amiodarone or diltiazem at this point.  Signed, Rozann Lesches, MD  01/13/2019, 8:39 AM

## 2019-01-13 NOTE — Progress Notes (Addendum)
ANTICOAGULATION CONSULT NOTE -   Pharmacy Consult for warfarin Indication: atrial fibrillation  No Known Allergies  Patient Measurements: Height: 6' (182.9 cm) Weight: 161 lb 13.1 oz (73.4 kg) IBW/kg (Calculated) : 73.1   Vital Signs: Temp: 98.6 F (37 C) (06/11 0719) Temp Source: Oral (06/11 0719) BP: 103/70 (06/11 0800) Pulse Rate: 97 (06/11 0828)  Labs: Recent Labs    01/11/19 0555 01/11/19 1151 01/12/19 0415 01/13/19 0436  HGB 8.2*  --  8.2* 8.0*  HCT 26.2*  --  25.8* 25.1*  PLT 252  --  250 247  LABPROT  --  31.6* 39.7* 21.9*  INR  --  3.1* 4.2* 1.9*  CREATININE 1.80*  --  2.02* 2.24*    Estimated Creatinine Clearance: 24.7 mL/min (A) (by C-G formula based on SCr of 2.24 mg/dL (H)).  Assessment: Pharmacy consulted to dose warfarin for this 78 yo female with atrial fibrillation on chronic anti-coagulation with warfarin.  Her home dose of warfarin is 2.5mg  on Sun/Tues/Thurs  and 5mg  on Mon/Wed/Fri/Sat.    INR 1.9- Vitamin K given 6/10. Plan for thoracentesis today and continue warfarin.  Patient on amiodarone- can increase INR  Goal of Therapy:  INR 2-3 Monitor platelets by anticoagulation protocol: Yes   Plan:  Warfarin 4 mg x 1 dose. Monitor daily INR and s/s of bleeding.  Margot Ables, PharmD Clinical Pharmacist 01/13/2019 10:23 AM

## 2019-01-13 NOTE — Progress Notes (Signed)
PROGRESS NOTE  TAILER VOLKERT  OVF:[id number]  DOB: 16-Feb-1941  DOA: 01/11/2019 PCP: [name], MD  Brief Admission Hx: 78 year old female with chronic atrial fibrillation fully anticoagulated with warfarin, combined CHF, COPD, oxygen dependent, hypertension, hypothyroidism and hyperlipidemia, deafness, polycythemia vera and other multiple medical comorbidities presented with severe shortness of breath, uncontrolled heart rate A. fib RVR.  MDM/Assessment & Plan:   1. Large bilateral pleural effusions with significant atelectasis- patient is status post left thoracentesis with 1200 mL of fluid removed.  Dr Thornton Papas says that he cannot tap right side due to pneumothorax on left.  Check CXR to be sure small pneumothorax has resolved.   2. Pneumothorax - pt had small pneumothorax from recent thoracentesis, will obtain CXR to follow up. Cannot tap other side if pneumothorax persists - discussed with Dr. Thornton Papas.    3. Acute on chronic respiratory failure with hypoxia -patient was volume overloaded and is status post thoracentesis with improvement in symptoms.  She remains in the stepdown unit.  CT of the chest revealed congestive heart failure and large pleural effusions.  SARS-CoV-2 test is negative.  Continue diuresis with IV Lasix.  Continue supportive therapy. 4. Atrial fibrillation with RVR-continue IV amiodarone and Cardizem per cardiology recommendations.  Appreciate the assistance of the heart care team.  Heart rate is better controlled at this time. 5. Small apical pneumothorax- status post thoracentesis.  Continue to monitor closely.  Repeat CXR today and in AM.  6. Hypothyroidism -Continue home levothyroxine. 7. Hyperlipidemia-continue pravastatin daily. 8. Acute combined CHF exacerbation- hold V Lasix today due to small bump in creatinine.  Continue to monitor intake and output closely.  Continue to monitor electrolytes closely.  Appreciate the assistance of the cardiology services. 9.  Polycythemia vera - pt has been seen by hematologist Dr. Delton Coombes who has recommended temporarily holding hydroxyurea while recovering from critical illness.  10. Hyperbilirubinemia - Holding stable, GI aware and would only be concerned if it rises above 3-4 range and /or transaminases bump.  11. GERD - continue protonix daily.   12. PVD - She is followed by Dr. [name] with vascular surgery and recently s/p amputation of toe.   DVT prophylaxis: warfarin Code Status: DNR Family Communication: telephone update to son  Disposition Plan: continue care in SDU    Consultants:  Cardiology  Hematology  Radiology  GI   Procedures:  Echocardiogram  ECHOCARDIOGRAM on 01/09/2019 IMPRESSIONS   1. The left ventricle has normal systolic function with an ejection fraction of 60-65%. The cavity size was normal. Left ventricular diastolic function could not be evaluated secondary to atrial fibrillation. 2. The right ventricle has severely reduced systolic function. The cavity was moderately enlarged. There is no increase in right ventricular wall thickness. Right ventricular systolic pressure is moderately elevated with an estimated pressure of 49.1  mmHg. 3. Left atrial size was severely dilated. 4. Right atrial size was severely dilated. 5. Large pleural effusion. 6. Trivial pericardial effusion is present. 7. No evidence of mitral valve stenosis. 8. No stenosis of the aortic valve. 9. The aortic root and ascending aorta are normal in size and structure. 10. The inferior vena cava was normal in size with <50% respiratory variability. 11. The interatrial septum was not assessed.  FINDINGS Left Ventricle: The left ventricle has normal systolic function, with an ejection fraction of 60-65%. The cavity size was normal. There is no increase in left ventricular wall thickness. Left ventricular diastolic function could not be evaluated  secondary to atrial fibrillation.  Right  Ventricle: The right ventricle has severely reduced systolic function. The cavity was moderately enlarged. There is no increase in right ventricular wall thickness. Right ventricular systolic pressure is moderately elevated with an estimated  pressure of 49.1 mmHg. Pacing wire/catheter visualized in the right ventricle.  Left Atrium: Left atrial size was severely dilated.  Right Atrium: Right atrial size was severely dilated. Right atrial pressure is estimated at 3 mmHg.  Interatrial Septum: The interatrial septum was not assessed.  Pericardium: Trivial pericardial effusion is present. There is a large pleural effusion.  Mitral Valve: The mitral valve is normal in structure. Mitral valve regurgitation is trivial by color flow Doppler. No evidence of mitral valve stenosis.  Tricuspid Valve: The tricuspid valve is normal in structure. Tricuspid valve regurgitation is mild by color flow Doppler.  Aortic Valve: The aortic valve is normal in structure. Aortic valve regurgitation was not visualized by color flow Doppler. There is No stenosis of the aortic valve.  Pulmonic Valve: The pulmonic valve was grossly normal. Pulmonic valve regurgitation is trivial by color flow Doppler.  Aorta: The aortic root and ascending aorta are normal in size and structure.  Venous: The inferior vena cava is normal in size with less than 50% respiratory variability.  Antimicrobials:    Subjective: Patient is more lethargic today.  Pt denies chest pain or palpitations.  Objective: Vitals:   01/13/19 0818 01/13/19 0828 01/13/19 1107 01/13/19 1200  BP:    (!) 95/37  Pulse:  97 (!) 101 (!) 101  Resp:  (!) 26 (!) 32 15  Temp:   99.2 F (37.3 C)   TempSrc:   Oral   SpO2: 94% 97% 93% 92%  Weight:      Height:        Intake/Output Summary (Last 24 hours) at 01/13/2019 1351 Last data filed at 01/13/2019 0403 Gross per 24 hour  Intake 240 ml  Output 700 ml  Net -460 ml   Filed Weights    01/11/19 0500 01/12/19 0500 01/13/19 0403  Weight: 73.5 kg 72.5 kg 73.4 kg   REVIEW OF SYSTEMS  As per history otherwise all reviewed and reported negative  Exam:  General exam: Chronically ill-appearing female she is sitting up in the bed she is awake and alert in no apparent distress.  She is more lethargic today. Respiratory system:  No increased work of breathing. Cardiovascular system: S1 & S2 heard.  Irregularly irregular.  No JVD, murmurs, gallops, clicks or pedal edema. Gastrointestinal system: Abdomen is nondistended, soft and nontender. Normal bowel sounds heard. Central nervous system: Alert and oriented. No focal neurological deficits. Extremities: 1+ pretibial edema bilateral lower extremities.  Data Reviewed: Basic Metabolic Panel: Recent Labs  Lab 01/04/2019 1426 01/09/19 0331 01/09/19 1933 01/10/19 0515 01/10/19 0839 01/11/19 0555 01/12/19 0415 01/13/19 0436  NA  --  136 136  --  135 136 134* 134*  K  --  4.2 3.9  --  3.8 4.3 4.5 4.3  CL  --  108 104  --  105 106 102 103  CO2  --  21* 20*  --  21* 19* 18* 22  GLUCOSE  --  109* 97  --  95 74 81 103*  BUN  --  53* 54*  --  55* 59* 69* 77*  CREATININE  --  1.63* 1.66*  --  1.65* 1.80* 2.02* 2.24*  CALCIUM  --  8.3* 8.4*  --  8.4* 8.2* 8.1* 8.2*  MG 2.5* 2.3  --  2.1  --  2.2 2.2  --   PHOS 4.5 4.2  --  4.1  --  4.9* 5.7*  --    Liver Function Tests: Recent Labs  Lab 01/09/19 0331 01/09/19 1933 01/10/19 0839 01/11/19 0555 01/12/19 0415  AST 13* 13* 12* 11* 11*  ALT 9 9 7 8 8   ALKPHOS 65 64 60 54 58  BILITOT 1.6* 1.4* 1.5* 1.5* 1.6*  PROT 6.0* 6.3* 5.8* 5.6* 5.7*  ALBUMIN 3.3* 3.4* 3.1* 2.9* 2.8*   No results for input(s): LIPASE, AMYLASE in the last 168 hours. No results for input(s): AMMONIA in the last 168 hours. CBC: Recent Labs  Lab 01/09/19 0331 01/10/19 0515 01/11/19 0555 01/12/19 0415 01/13/19 0436  WBC 5.9 5.4 5.6 9.2 12.0*  NEUTROABS 4.6 4.3 4.8 8.1*  --   HGB 8.5* 8.5* 8.2* 8.2*  8.0*  HCT 27.2* 27.9* 26.2* 25.8* 25.1*  MCV 132.7* 134.1* 130.3* 127.7* 128.7*  PLT 195 207 252 250 247   Cardiac Enzymes: Recent Labs  Lab 01/22/2019 1200 01/05/2019 1423 01/16/2019 2137 01/09/19 0330  TROPONINI <0.03 0.03* <0.03 <0.03   CBG (last 3)  Recent Labs    01/11/19 0805 01/12/19 0729 01/13/19 0719  GLUCAP 73 86 103*   Recent Results (from the past 240 hour(s))  Blood culture (routine x 2)     Status: None   Collection Time: 01/31/2019 12:00 PM   Specimen: BLOOD RIGHT ARM  Result Value Ref Range Status   Specimen Description BLOOD RIGHT ARM  Final   Special Requests   Final    Blood Culture adequate volume BOTTLES DRAWN AEROBIC AND ANAEROBIC   Culture   Final    NO GROWTH 5 DAYS Performed at [facility], [address] [place], [place] [address]    Report Status 01/13/2019 FINAL  Final  SARS Coronavirus 2 (CEPHEID - Performed in [place] hospital lab), Hosp Order     Status: None   Collection Time: 02/01/2019 12:17 PM   Specimen: Nasopharyngeal Swab  Result Value Ref Range Status   SARS Coronavirus 2 NEGATIVE NEGATIVE Final    Comment: (NOTE) If result is NEGATIVE SARS-CoV-2 target nucleic acids are NOT DETECTED. The SARS-CoV-2 RNA is generally detectable in upper and lower  respiratory specimens during the acute phase of infection. The lowest  concentration of SARS-CoV-2 viral copies this assay can detect is 250  copies / mL. A negative result does not preclude SARS-CoV-2 infection  and should not be used as the sole basis for treatment or other  patient management decisions.  A negative result may occur with  improper specimen collection / handling, submission of specimen other  than nasopharyngeal swab, presence of viral mutation(s) within the  areas targeted by this assay, and inadequate number of viral copies  (<250 copies / mL). A negative result must be combined with clinical  observations, patient history, and epidemiological information. If result  is POSITIVE SARS-CoV-2 target nucleic acids are DETECTED. The SARS-CoV-2 RNA is generally detectable in upper and lower  respiratory specimens dur ing the acute phase of infection.  Positive  results are indicative of active infection with SARS-CoV-2.  Clinical  correlation with patient history and other diagnostic information is  necessary to determine patient infection status.  Positive results do  not rule out bacterial infection or co-infection with other viruses. If result is PRESUMPTIVE POSTIVE SARS-CoV-2 nucleic acids MAY BE PRESENT.   A presumptive positive result was obtained on the submitted specimen  and confirmed  on repeat testing.  While 2019 novel coronavirus  (SARS-CoV-2) nucleic acids may be present in the submitted sample  additional confirmatory testing may be necessary for epidemiological  and / or clinical management purposes  to differentiate between  SARS-CoV-2 and other Sarbecovirus currently known to infect humans.  If clinically indicated additional testing with an alternate test  methodology [phone]) is advised. The SARS-CoV-2 RNA is generally  detectable in upper and lower respiratory sp ecimens during the acute  phase of infection. The expected result is Negative. Fact Sheet for Patients:  StrictlyIdeas.no Fact Sheet for Healthcare Providers: BankingDealers.co.za This test is not yet approved or cleared by the [place] FDA and has been authorized for detection and/or diagnosis of SARS-CoV-2 by FDA under an Emergency Use Authorization (EUA).  This EUA will remain in effect (meaning this test can be used) for the duration of the COVID-19 declaration under Section 564(b)(1) of the Act, 21 U.S.C. section 360bbb-3(b)(1), unless the authorization is terminated or revoked sooner. Performed at [facility], [address], [place], [place] [address]   Blood culture (routine x 2)     Status: None   Collection  Time: 01/23/2019 12:46 PM   Specimen: Left Antecubital; Blood  Result Value Ref Range Status   Specimen Description LEFT ANTECUBITAL  Final   Special Requests   Final    BOTTLES DRAWN AEROBIC AND ANAEROBIC Blood Culture adequate volume   Culture   Final    NO GROWTH 5 DAYS Performed at [facility], [address], [place], [place] [address]    Report Status 01/13/2019 FINAL  Final  MRSA PCR Screening     Status: None   Collection Time: 01/15/2019  7:57 PM   Specimen: Nasal Mucosa; Nasopharyngeal  Result Value Ref Range Status   MRSA by PCR NEGATIVE NEGATIVE Final    Comment:        The GeneXpert MRSA Assay (FDA approved for NASAL specimens only), is one component of a comprehensive MRSA colonization surveillance program. It is not intended to diagnose MRSA infection nor to guide or monitor treatment for MRSA infections. Performed at [facility], [address], [place], [place] [address]   Fungus Culture With Stain     Status: None (Preliminary result)   Collection Time: 01/10/19 12:42 PM  Result Value Ref Range Status   Fungus Stain Final report  Final    Comment: (NOTE) Performed At: [facility] [address] [place], [place] [id number] Rush Farmer MD [id number]    Fungus (Mycology) Culture PENDING  Incomplete   Fungal Source PLEURAL  Final    Comment: Performed at [facility], [address] [place], [place] [address]  Fungus Culture Result     Status: None   Collection Time: 01/10/19 12:42 PM  Result Value Ref Range Status   Result 1 Comment  Final    Comment: (NOTE) KOH/Calcofluor preparation:  no fungus observed. Performed At: [facility] [place], [place] [id number] Rush Farmer MD [id number]   Gram stain     Status: None   Collection Time: 01/10/19 12:43 PM   Specimen: Pleura; Body Fluid  Result Value Ref Range Status   Specimen Description PLEURAL  Final   Special Requests NONE  Final   Gram Stain   Final     CYTOSPIN SMEAR NO ORGANISMS SEEN WBC PRESENT,BOTH PMN AND MONONUCLEAR Performed at [facility], [address] [place], [place] [address]    Report Status 01/10/2019 FINAL  Final  Studies: No results found. Scheduled Meds: . Chlorhexidine Gluconate Cloth  6 each Topical Daily  . gabapentin  100 mg Oral TID  . guaiFENesin  600 mg Oral BID  . ipratropium  0.5 mg Nebulization TID  . levalbuterol  0.63 mg Nebulization TID  . levothyroxine  125 mcg Oral Q0600  . mouth rinse  15 mL Mouth Rinse BID  . pantoprazole  40 mg Oral Daily  . pravastatin  10 mg Oral q1800  . warfarin  4 mg Oral ONCE-1800  . Warfarin - Pharmacist Dosing Inpatient   Does not apply q1800   Continuous Infusions: . amiodarone 30 mg/hr (01/13/19 0354)  . diltiazem (CARDIZEM) infusion 5 mg/hr (01/13/19 0353)    Active Problems:   Polycythemia vera (HCC)   Chronic atrial fibrillation   Anticoagulant long-term use   HTN (hypertension)   Hypothyroidism   PVD (peripheral vascular disease) (HCC)   Dyspnea   Hypoxemia   CHF (congestive heart failure) (HCC)   Acute on chronic respiratory failure with hypoxia (HCC)   Anemia   Chronic respiratory failure with hypoxia (HCC)   Hyperlipidemia   Acute renal failure (ARF) (HCC)   Pressure ulcer   GERD (gastroesophageal reflux disease)   Iron deficiency anemia due to chronic blood loss   Atrial fibrillation with RVR ([place])   Pneumothorax on left  Critical CareTime spent: 32 minutes   Irwin Brakeman, MD [facility] 01/13/2019, 1:51 PM    LOS: 5 days  How to contact the [facility] Attending or Consulting provider 7A - 7P or covering provider during after hours [place], for this patient?  1. Check the care team in [facility] and look for a) attending/consulting TR[facility] provider listed and b) the [facility] team listed 2. Log into [email or web address] and use 's universal password to access. If you do not have the password, please contact the hospital operator. 3. Locate the  [facility] provider you are looking for under [facility] and page to a number that you can be directly reached. 4. If you still have difficulty reaching the provider, please page the [facility] (Director on Call) for the Hospitalists listed on amion for assistance.

## 2019-01-14 ENCOUNTER — Inpatient Hospital Stay (HOSPITAL_COMMUNITY): Payer: Medicare HMO

## 2019-01-14 ENCOUNTER — Encounter (HOSPITAL_COMMUNITY): Payer: Self-pay

## 2019-01-14 DIAGNOSIS — J939 Pneumothorax, unspecified: Secondary | ICD-10-CM

## 2019-01-14 DIAGNOSIS — Z9889 Other specified postprocedural states: Secondary | ICD-10-CM

## 2019-01-14 DIAGNOSIS — E782 Mixed hyperlipidemia: Secondary | ICD-10-CM

## 2019-01-14 LAB — IRON AND TIBC
Iron: 19 ug/dL — ABNORMAL LOW (ref 28–170)
Saturation Ratios: 11 % (ref 10.4–31.8)
TIBC: 171 ug/dL — ABNORMAL LOW (ref 250–450)
UIBC: 152 ug/dL

## 2019-01-14 LAB — BODY FLUID CELL COUNT WITH DIFFERENTIAL
Eos, Fluid: 0 %
Lymphs, Fluid: 7 %
Monocyte-Macrophage-Serous Fluid: 3 % — ABNORMAL LOW (ref 50–90)
Neutrophil Count, Fluid: 90 % — ABNORMAL HIGH (ref 0–25)
Other Cells, Fluid: 1 %
Total Nucleated Cell Count, Fluid: 279 cu mm (ref 0–1000)

## 2019-01-14 LAB — MAGNESIUM: Magnesium: 2.4 mg/dL (ref 1.7–2.4)

## 2019-01-14 LAB — GRAM STAIN

## 2019-01-14 LAB — RENAL FUNCTION PANEL
Albumin: 2.7 g/dL — ABNORMAL LOW (ref 3.5–5.0)
Anion gap: 10 (ref 5–15)
BUN: 84 mg/dL — ABNORMAL HIGH (ref 8–23)
CO2: 22 mmol/L (ref 22–32)
Calcium: 8.1 mg/dL — ABNORMAL LOW (ref 8.9–10.3)
Chloride: 102 mmol/L (ref 98–111)
Creatinine, Ser: 2.46 mg/dL — ABNORMAL HIGH (ref 0.44–1.00)
GFR calc Af Amer: 21 mL/min — ABNORMAL LOW (ref >60)
GFR calc non Af Amer: 18 mL/min — ABNORMAL LOW (ref >60)
Glucose, Bld: 91 mg/dL (ref 70–99)
Phosphorus: 5 mg/dL — ABNORMAL HIGH (ref 2.5–4.6)
Potassium: 4.3 mmol/L (ref 3.5–5.1)
Sodium: 134 mmol/L — ABNORMAL LOW (ref 135–145)

## 2019-01-14 LAB — PROTEIN / CREATININE RATIO, URINE
Creatinine, Urine: 121.09 mg/dL
Protein Creatinine Ratio: 0.13 mg/mg{Cre} (ref 0.00–0.15)
Total Protein, Urine: 16 mg/dL

## 2019-01-14 LAB — PROTEIN, PLEURAL OR PERITONEAL FLUID: Total protein, fluid: <3 g/dL

## 2019-01-14 LAB — CBC
HCT: 25.8 % — ABNORMAL LOW (ref 36.0–46.0)
Hemoglobin: 8.2 g/dL — ABNORMAL LOW (ref 12.0–15.0)
MCH: 40.4 pg — ABNORMAL HIGH (ref 26.0–34.0)
MCHC: 31.8 g/dL (ref 30.0–36.0)
MCV: 127.1 fL — ABNORMAL HIGH (ref 80.0–100.0)
Platelets: 286 10*3/uL (ref 150–400)
RBC: 2.03 MIL/uL — ABNORMAL LOW (ref 3.87–5.11)
RDW: 21.1 % — ABNORMAL HIGH (ref 11.5–15.5)
WBC: 11.4 10*3/uL — ABNORMAL HIGH (ref 4.0–10.5)
nRBC: 0.3 % — ABNORMAL HIGH (ref 0.0–0.2)

## 2019-01-14 LAB — PROTIME-INR
INR: 1.9 — ABNORMAL HIGH (ref 0.8–1.2)
Prothrombin Time: 21.1 seconds — ABNORMAL HIGH (ref 11.4–15.2)

## 2019-01-14 LAB — GLUCOSE, PLEURAL OR PERITONEAL FLUID: Glucose, Fluid: 99 mg/dL

## 2019-01-14 LAB — CREATININE, URINE, RANDOM: Creatinine, Urine: 123.58 mg/dL

## 2019-01-14 LAB — SODIUM, URINE, RANDOM: Sodium, Ur: <10 mmol/L

## 2019-01-14 LAB — AMYLASE, PLEURAL OR PERITONEAL FLUID: Amylase, Fluid: 48 U/L

## 2019-01-14 LAB — GLUCOSE, CAPILLARY: Glucose-Capillary: 86 mg/dL (ref 70–99)

## 2019-01-14 LAB — FERRITIN: Ferritin: 372 ng/mL — ABNORMAL HIGH (ref 11–307)

## 2019-01-14 MED ORDER — WARFARIN SODIUM 2 MG PO TABS
4.0000 mg | ORAL_TABLET | Freq: Once | ORAL | Status: AC
  Administered 2019-01-14: 4 mg via ORAL
  Filled 2019-01-14: qty 2

## 2019-01-14 MED ORDER — AMOXICILLIN-POT CLAVULANATE 500-125 MG PO TABS
1.0000 | ORAL_TABLET | Freq: Two times a day (BID) | ORAL | Status: DC
  Administered 2019-01-14 – 2019-01-16 (×5): 500 mg via ORAL
  Filled 2019-01-14 (×5): qty 1

## 2019-01-14 MED ORDER — SODIUM CHLORIDE 0.9% FLUSH
10.0000 mL | Freq: Two times a day (BID) | INTRAVENOUS | Status: DC
  Administered 2019-01-14 – 2019-01-17 (×6): 10 mL

## 2019-01-14 MED ORDER — SODIUM CHLORIDE 0.9% FLUSH: 10.0000 mL | INTRAVENOUS | Status: DC | PRN

## 2019-01-14 NOTE — Progress Notes (Signed)
Thoracentesis complete no signs of distress.  

## 2019-01-14 NOTE — Procedures (Signed)
PreOperative Dx: RT pleural effusion Postoperative Dx: RT pleural effusion Procedure:   US guided RT thoracentesis Radiologist:  Thornton Papas Anesthesia:  10 ml of 1% lidocaine Specimen:  1.1 L of yellow colored fluid EBL:   < 1 ml Complications: None

## 2019-01-14 NOTE — Consult Note (Signed)
Reason for Consult: AKI/CKD stage 3 Referring Physician: Wynetta Emery, MD  Charlotte [name] is an 78 y.o. female.  HPI: Charlotte [name] has an extensive PMH most notable for chronic atrial fibrillation on coumadin, combined CHF, COPD with oxygen dependence, HTN, PCV (had been on hydroxyurea but on hold due to development of anemia), PVD s/p toe amputation, and CKD stage 3 who presented to [facility] with worsening SOB and weakness.  She was covid-19 negative and was noted to have rapid A fib in the ED.  Labs were notable for anemia with heme + stools, INR 3.7, AKI/CKD with Cr bump to 1.76.  CXR revealed pleural effusions and central vascular congestion.  CT scan revealed cardiomegaly with bilateral atrial enlargement, no pericardial effusion, and large bilateral pleural effusions.  Cardiology was consulted and she was started on IV diuretics, however her BUN/Cr have continued to climb and lasix was held.  We were consulted to further evaluate and manage her AKI/CKD.  The trend in Scr is seen below.  Pt is very hard of hearing but denies any previous knowledge of her CKD.  Trend in Creatinine: Creatinine, Ser  Date/Time Value Ref Range Status  01/14/2019 04:52 AM 2.46 (H) 0.44 - 1.00 mg/dL Final  01/13/2019 04:36 AM 2.24 (H) 0.44 - 1.00 mg/dL Final  01/12/2019 04:15 AM 2.02 (H) 0.44 - 1.00 mg/dL Final  01/11/2019 05:55 AM 1.80 (H) 0.44 - 1.00 mg/dL Final  01/10/2019 08:39 AM 1.65 (H) 0.44 - 1.00 mg/dL Final  01/09/2019 07:33 PM 1.66 (H) 0.44 - 1.00 mg/dL Final  01/09/2019 03:31 AM 1.63 (H) 0.44 - 1.00 mg/dL Final  01/31/2019 12:00 PM 1.76 (H) 0.44 - 1.00 mg/dL Final  07/07/2018 11:56 AM 0.87 0.44 - 1.00 mg/dL Final  05/10/2018 12:16 PM 0.94 0.44 - 1.00 mg/dL Final  03/12/2018 09:32 AM 0.87 0.44 - 1.00 mg/dL Final  12/13/2017 03:46 AM 1.24 (H) 0.44 - 1.00 mg/dL Final  12/12/2017 04:47 AM 1.10 (H) 0.44 - 1.00 mg/dL Final  12/10/2017 07:27 AM 0.98 0.44 - 1.00 mg/dL Final  11/11/2017 08:42 AM 1.00 0.44 - 1.00 mg/dL  Final  09/29/2017 04:16 PM 1.00 0.44 - 1.00 mg/dL Final  09/29/2017 08:18 AM 1.06 (H) 0.44 - 1.00 mg/dL Final  09/21/2017 07:35 AM 0.90 0.44 - 1.00 mg/dL Final  08/19/2017 09:37 AM 1.40 (H) 0.44 - 1.00 mg/dL Final  07/03/2017 10:19 AM 1.04 (H) 0.44 - 1.00 mg/dL Final  06/30/2017 05:22 AM 1.52 (H) 0.44 - 1.00 mg/dL Final  06/29/2017 06:38 AM 1.49 (H) 0.44 - 1.00 mg/dL Final  06/28/2017 07:15 AM 1.30 (H) 0.44 - 1.00 mg/dL Final  06/27/2017 05:42 PM 1.37 (H) 0.44 - 1.00 mg/dL Final  06/22/2017 09:54 AM 1.58 (H) 0.44 - 1.00 mg/dL Final  06/09/2017 06:10 AM 1.72 (H) 0.44 - 1.00 mg/dL Final  06/08/2017 06:17 PM 1.84 (H) 0.44 - 1.00 mg/dL Final  05/31/2017 05:25 AM 1.48 (H) 0.44 - 1.00 mg/dL Final  05/30/2017 04:41 AM 1.71 (H) 0.44 - 1.00 mg/dL Final  05/29/2017 04:08 AM 2.07 (H) 0.44 - 1.00 mg/dL Final  05/28/2017 04:16 PM 2.48 (H) 0.44 - 1.00 mg/dL Final  03/29/2017 05:00 AM 1.41 (H) 0.44 - 1.00 mg/dL Final  03/27/2017 11:13 PM 1.32 (H) 0.44 - 1.00 mg/dL Final  03/27/2017 11:04 AM 1.24 (H) 0.44 - 1.00 mg/dL Final  03/26/2017 11:30 PM 1.24 (H) 0.44 - 1.00 mg/dL Final  03/26/2017 11:43 AM 1.18 (H) 0.44 - 1.00 mg/dL Final  03/26/2017 02:19 AM 1.15 (H) 0.44 - 1.00 mg/dL  Final  03/25/2017 05:23 PM 1.16 (H) 0.44 - 1.00 mg/dL Final  03/25/2017 05:07 AM 1.05 (H) 0.44 - 1.00 mg/dL Final  03/24/2017 09:51 AM 1.16 (H) 0.44 - 1.00 mg/dL Final  03/23/2017 10:52 AM 1.25 (H) 0.44 - 1.00 mg/dL Final  03/22/2017 05:00 AM 1.20 (H) 0.44 - 1.00 mg/dL Final  03/21/2017 08:00 PM 1.23 (H) 0.44 - 1.00 mg/dL Final  03/21/2017 02:51 PM 1.34 (H) 0.44 - 1.00 mg/dL Final  03/21/2017 03:56 AM 1.28 (H) 0.44 - 1.00 mg/dL Final  03/20/2017 04:32 AM 1.57 (H) 0.44 - 1.00 mg/dL Final  03/19/2017 06:59 PM 1.73 (H) 0.44 - 1.00 mg/dL Final  03/19/2017 04:22 AM 1.79 (H) 0.44 - 1.00 mg/dL Final  03/18/2017 07:19 PM 1.89 (H) 0.44 - 1.00 mg/dL Final  03/18/2017 07:52 AM 2.05 (H) 0.44 - 1.00 mg/dL Final  03/17/2017 12:30 PM 2.22  (H) 0.44 - 1.00 mg/dL Final  03/16/2017 03:56 AM 2.32 (H) 0.44 - 1.00 mg/dL Final    PMH:   Past Medical History:  Diagnosis Date  . Anxiety   . Arthritis   . Atrial fibrillation, chronic    a. on Coumadin for anticoagulation.   . Cellulitis 06/29/2017   Right leg  . Chronic hypoxemic respiratory failure (HCC)    4 lpm  . COPD (chronic obstructive pulmonary disease) ([place])   . 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 DVT (deep venous thrombosis) ([place])   . Essential hypertension   . GERD (gastroesophageal reflux disease)   . HOH (hard of hearing)   . Hyperlipidemia   . Hypothyroidism   . Iron deficiency anemia due to chronic blood loss 03/18/2018  . Open wound    Right groin  . Peripheral neuropathy   . Peripheral vascular disease ([place])   . 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 Ulcer of ankle ([place])    Bilateral 2015  . Varicose veins     PSH:   Past Surgical History:  Procedure Laterality Date  . ABDOMINAL AORTAGRAM N/A 09/14/2012   Procedure: ABDOMINAL [name];  Surgeon: Serafina Mitchell, MD;  Location: [facility] CATH LAB;  Service: Cardiovascular;  Laterality: N/A;  . ABDOMINAL AORTOGRAM N/A 03/06/2017   Procedure: ABDOMINAL AORTOGRAM;  Surgeon: Elam Dutch, MD;  Location: [place] CV LAB;  Service: Cardiovascular;  Laterality: N/A;  . ABDOMINAL AORTOGRAM W/LOWER EXTREMITY N/A 09/21/2017   Procedure: ABDOMINAL AORTOGRAM W/LOWER EXTREMITY;  Surgeon: Waynetta Sandy, MD;  Location: [place] CV LAB;  Service: Cardiovascular;  Laterality: N/A;  . ABDOMINAL HYSTERECTOMY    . AMPUTATION Right 09/29/2017   Procedure: DEBRIDEMENT TOE AMPUTATION RIGHT FIFTH TOE;  Surgeon: Angelia Mould, MD;  Location: [place];  Service: Vascular;  Laterality: Right;  . APPLICATION OF WOUND VAC Right 09/29/2017   Procedure: APPLICATION OF WOUND VAC ON RIGHT FOOT;  Surgeon: Angelia Mould, MD;  Location: [place];  Service: Vascular;  Laterality: Right;  . CATARACT EXTRACTION W/PHACO  Right 08/31/2017   Procedure: CATARACT EXTRACTION PHACO AND INTRAOCULAR LENS PLACEMENT RIGHT EYE;  Surgeon: Tonny Branch, MD;  [facility];  Service: Ophthalmology;  Laterality: Right;  CDE: 13.27  . CATARACT EXTRACTION W/PHACO Left 09/14/2017   Procedure: CATARACT EXTRACTION PHACO AND INTRAOCULAR LENS PLACEMENT LEFT EYE;  Surgeon: Tonny Branch, MD;  Location: [facility];  Service: Ophthalmology;  Laterality: Left;  CDE: 13.02  . CORONARY ANGIOPLASTY    . DRESSING CHANGE UNDER ANESTHESIA  12/02/2011   Procedure: DRESSING CHANGE UNDER ANESTHESIA;  Surgeon: Dorothyann Peng  Vela Prose, MD;  Location: [facility];  Service: Orthopedics;  Laterality: Right;  . ENDARTERECTOMY FEMORAL Right 09/15/2012   Procedure: ENDARTERECTOMY FEMORAL;  Surgeon: Mal Misty, MD;  Location: [place];  Service: Vascular;  Laterality: Right;  . FASCIOTOMY  11/29/2011   Procedure: FASCIOTOMY;  Surgeon: Carole Civil, MD;  [facility];  Service: Orthopedics;  Laterality: Right;  right thigh   . FEMORAL-POPLITEAL BYPASS GRAFT Right 09/15/2012   Procedure: BYPASS GRAFT FEMORAL-POPLITEAL ARTERY;  Surgeon: Mal Misty, MD;  Location: [place];  Service: Vascular;  Laterality: Right;  . FEMORAL-TIBIAL BYPASS GRAFT Right 03/11/2017   Procedure: RIGHT FEMORAL-TIBIAL BYPASS IN-SITU GREATER SAPHENOUS VEIN;  Surgeon: Waynetta Sandy, MD;  Location: [place];  Service: Vascular;  Laterality: Right;  . GROIN DEBRIDEMENT Right 12/10/2017   Procedure: GROIN DEBRIDEMENT EXPLORATION;  Surgeon: Waynetta Sandy, MD;  Location: [place];  Service: Vascular;  Laterality: Right;  . HIP PINNING,CANNULATED  11/19/2011   Procedure: CANNULATED HIP PINNING;  Surgeon: Sanjuana Kava, MD;  Location[facility];  Service: Orthopedics;  Laterality: Right;  . LOWER EXTREMITY ANGIOGRAPHY Bilateral 03/06/2017   Procedure: Lower Extremity Angiography;  Surgeon: Elam Dutch, MD;  Location: [place] CV LAB;  Service: Cardiovascular;  Laterality: Bilateral;   . PATCH ANGIOPLASTY Right 09/15/2012   Procedure: PATCH ANGIOPLASTY;  Surgeon: Mal Misty, MD;  Location: [place];  Service: Vascular;  Laterality: Right;  . TUBAL LIGATION      Allergies: No Known Allergies  Medications:   Prior to Admission medications   Medication Sig Start Date End Date Taking? Authorizing Provider  acetaminophen (PAIN RELIEVER) 500 MG tablet Take 1,000 mg by mouth every 6 (six) hours as needed for mild pain or moderate pain.   Yes [provider]  aspirin EC 81 MG EC tablet Take 1 tablet (81 mg total) by mouth daily. 06/02/17  Yes Johnson, Clanford L, MD  CARTIA XT 240 MG 24 hr capsule TAKE 1 CAPSULE BY MOUTH ONCE DAILY Patient taking differently: Take 240 mg by mouth every morning.  04/06/18  Yes Herminio Commons, MD  gabapentin (NEURONTIN) 300 MG capsule Take 300 mg by mouth 3 (three) times daily. 12/07/18  Yes [provider]  hydroxyurea (HYDREA) 500 MG capsule Take 2 capsules (1,000 mg total) by mouth daily. May take with food to minimize GI side effects. Patient taking differently: Take 1,000 mg by mouth every morning. May take with food to minimize GI side effects. 07/14/18  Yes Higgs, Mathis Dad, MD  levothyroxine (SYNTHROID) 112 MCG tablet Take 112 mcg by mouth every morning. 12/23/18  Yes [provider]  pravastatin (PRAVACHOL) 20 MG tablet Take 10 mg by mouth at bedtime.  08/08/17  Yes [provider]  torsemide (DEMADEX) 20 MG tablet TAKE 1 TABLET BY MOUTH ONCE DAILY AS NEEDED **MAY  TAKE  FOR  WEIGHT  GAIN  OF  3  LBS  OVER  24  HOURS** Patient taking differently: Take 20 mg by mouth daily as needed (for weight gain over 3 pounds).  10/25/18  Yes Herminio Commons, MD  warfarin (COUMADIN) 5 MG tablet Take 1 tablet daily except 1/2 tablet on Sundays, Tuesdays and Thursdays or as directed Patient taking differently: Take 2.5-5 mg by mouth See admin instructions. Take 1 tablet (5mg  total) daily except 1/2 tablet (2.5mg  total) on  Sundays, Tuesdays and Thursdays or as directed. 11/23/18  Yes Herminio Commons, MD    Inpatient medications: . amoxicillin-clavulanate  1 tablet Oral Q12H  . Chlorhexidine Gluconate Cloth  6 each Topical Daily  . gabapentin  100 mg Oral TID  . guaiFENesin  600 mg Oral BID  . ipratropium  0.5 mg Nebulization TID  . levalbuterol  0.63 mg Nebulization TID  . levothyroxine  125 mcg Oral Q0600  . mouth rinse  15 mL Mouth Rinse BID  . pantoprazole  40 mg Oral Daily  . pravastatin  10 mg Oral q1800  . warfarin  4 mg Oral Once  . Warfarin - Pharmacist Dosing Inpatient   Does not apply q1800    Discontinued Meds:   Medications Discontinued During This Encounter  Medication Reason  . sodium chloride 0.9 % bolus 500 mL   . 0.9 %  sodium chloride infusion   . 0.9 %  sodium chloride infusion   . cephALEXin (KEFLEX) 500 MG capsule Completed Course  . diltiazem (CARDIZEM CD) 240 MG 24 hr capsule Duplicate  . gabapentin (NEURONTIN) 100 MG capsule Dose change  . levothyroxine (SYNTHROID, LEVOTHROID) 125 MCG tablet Dose change  . traMADol (ULTRAM) 50 MG tablet No longer needed (for PRN medications)  . 0.9 %  sodium chloride infusion   . furosemide (LASIX) injection 40 mg   . ipratropium (ATROVENT) nebulizer solution 0.5 mg   . levalbuterol (XOPENEX) nebulizer solution 0.63 mg   . furosemide (LASIX) injection 60 mg   . furosemide (LASIX) injection 40 mg   . warfarin (COUMADIN) tablet 4 mg     Social History:  reports that she has never smoked. She has never used smokeless tobacco. She reports that she does not drink alcohol or use drugs.  Family History:   Family History  Problem Relation Age of Onset  . Heart failure Mother   . Heart disease Mother   . Stroke Father   . Heart disease Sister   . Diabetes Son   . Hypertension Sister   . Hypertension Sister   . Stroke Sister     Pertinent items are noted in HPI. Weight change: -2 kg  Intake/Output Summary (Last 24 hours) at  01/14/2019 1138 Last data filed at 01/14/2019 0800 Gross per 24 hour  Intake 934.92 ml  Output 900 ml  Net 34.92 ml   BP (!) 98/53   Pulse (!) 106   Temp (!) 97.5 F (36.4 C) (Oral)   Resp (!) 21   Ht 6' (1.829 m)   Wt 71.4 kg   LMP  (LMP Unknown)   SpO2 97%   BMI 21.35 kg/m  Vitals:   01/14/19 0600 01/14/19 0630 01/14/19 0700 01/14/19 0732  BP: (!) 110/51 98/63 (!) 98/53   Pulse: 99 (!) 106 (!) 106   Resp: (!) 35 (!) 30 (!) 21   Temp:    (!) 97.5 F (36.4 C)  TempSrc:    Oral  SpO2: 91% 95% 97%   Weight:      Height:         General appearance: fatigued, no distress, slowed mentation and frail and chronically ill-appearing Head: Normocephalic, without obvious abnormality, atraumatic Eyes: negative findings: lids and lashes normal and conjunctivae and sclerae normal Resp: diminished breath sounds bibasilar Cardio: irregularly irregular rhythm, no rub and tacycardic at 106 GI: soft, non-tender; bowel sounds normal; no masses,  no organomegaly Extremities: protective boots in place as well as ACDs, no edema  Labs: Basic Metabolic Panel: Recent Labs  Lab 01/14/2019 1426 01/09/19 0331 01/09/19 1933 01/10/19 0515 01/10/19 0623 01/11/19 0555 01/12/19 7628  01/13/19 0436 01/14/19 0452  NA  --  136 136  --  135 136 134* 134* 134*  K  --  4.2 3.9  --  3.8 4.3 4.5 4.3 4.3  CL  --  108 104  --  105 106 102 103 102  CO2  --  21* 20*  --  21* 19* 18* 22 22  GLUCOSE  --  109* 97  --  95 74 81 103* 91  BUN  --  53* 54*  --  55* 59* 69* 77* 84*  CREATININE  --  1.63* 1.66*  --  1.65* 1.80* 2.02* 2.24* 2.46*  ALBUMIN 3.6 3.3* 3.4*  --  3.1* 2.9* 2.8*  --  2.7*  CALCIUM  --  8.3* 8.4*  --  8.4* 8.2* 8.1* 8.2* 8.1*  PHOS 4.5 4.2  --  4.1  --  4.9* 5.7*  --  5.0*   Liver Function Tests: Recent Labs  Lab 01/10/19 0839 01/11/19 0555 01/12/19 0415 01/14/19 0452  AST 12* 11* 11*  --   ALT 7 8 8   --   ALKPHOS 60 54 58  --   BILITOT 1.5* 1.5* 1.6*  --   PROT 5.8* 5.6* 5.7*   --   ALBUMIN 3.1* 2.9* 2.8* 2.7*   No results for input(s): LIPASE, AMYLASE in the last 168 hours. No results for input(s): AMMONIA in the last 168 hours. CBC: Recent Labs  Lab 01/09/19 0331 01/10/19 0515 01/11/19 0555 01/12/19 0415 01/13/19 0436 01/14/19 0452  WBC 5.9 5.4 5.6 9.2 12.0* 11.4*  NEUTROABS 4.6 4.3 4.8 8.1*  --   --   HGB 8.5* 8.5* 8.2* 8.2* 8.0* 8.2*  HCT 27.2* 27.9* 26.2* 25.8* 25.1* 25.8*  MCV 132.7* 134.1* 130.3* 127.7* 128.7* 127.1*  PLT 195 207 252 250 247 286   PT/INR: @LABRCNTIP (inr:5) Cardiac Enzymes: ) Recent Labs  Lab 01/11/2019 1200 01/12/2019 1423 01/31/2019 2137 01/09/19 0330  TROPONINI <0.03 0.03* <0.03 <0.03   CBG: Recent Labs  Lab 01/10/19 1126 01/11/19 0805 01/12/19 0729 01/13/19 0719 01/14/19 0736  GLUCAP 75 73 86 103* 86    Iron Studies:  Recent Labs  Lab 01/27/2019 1412  IRON 34  TIBC 220*  FERRITIN 161    Xrays/Other Studies: Dg Chest Port 1 View  Result Date: 01/14/2019 CLINICAL DATA:  Right-sided pleural effusion EXAM: PORTABLE CHEST 1 VIEW COMPARISON:  01/13/2019 FINDINGS: Cardiac shadow is enlarged but stable. Bilateral pleural effusions are noted left greater than right with underlying atelectasis/infiltrate. No pneumothorax is noted. IMPRESSION: Stable appearance of the chest from the previous day. Electronically Signed   By: Inez Catalina M.D.   On: 01/14/2019 07:33   Dg Chest Port 1 View  Result Date: 01/13/2019 CLINICAL DATA:  Follow-up pneumothorax. EXAM: PORTABLE CHEST 1 VIEW COMPARISON:  01/11/2019 and 01/10/2019 FINDINGS: Patient is moderately rotated to the right. Lungs are adequately inflated and demonstrate interval worsening of moderate bilateral perihilar and bibasilar opacification likely interstitial edema with moderate bilateral effusions left greater than right. There is likely associated atelectasis in the lung bases. No definite left apical pneumothorax visualized. Stable cardiomegaly. Remainder of the exam  is unchanged. IMPRESSION: Previously seen tiny left apical pneumothorax not currently visualized. Interval worsening bilateral perihilar and bibasilar opacification likely interstitial edema with moderate size effusions left greater than right. Likely associated basilar atelectasis. Infection in the lung bases is possible. Electronically Signed   By: Marin Olp M.D.   On: 01/13/2019 14:06     Assessment/Plan: 1.  AKI/CKD stage 3 - presumably due to ischemic ATN in setting of acute on chronic combined CHF with IV diuresis, rapid A fib with hypotension, and ABLA.    Agree with holding lasix for now and following UOP and daily Scr.  Pt is not a suitable candidate for HD given her advanced age, multiple co-morbidities, and poor functional status.  She is appropriately a DNR.  Will continue to follow along and cont to renal dose medications.   1. Will order renal US to r/o obstruction, urine studies, and calculate FeNa. 2. Check PVR with bladder scan 3. Continue to follow daily Scr 2. Acute hypoxic respiratory distress- in setting of large bilateral pleural effusions.  S/p left thoracentesis with 1.2 liters removed.  Agree with plan for right thoracentesis.  Covid-19 negative.  Cardiology following for volume and will likely need to restart lasix tomorrow. 3. Atrial fib with RVR- on IV amio and cardizem per cardiology 4. Possible pneumonia- on augmentin 5. Anemia- heme + stools, GI consulted however not a candidate for endoscopic evaluations due to debilitation and multiple co-morbidities.  Follow H/H and transfuse prn.  Was on hydroxyurea per Heme which has been stopped for some time.  6. HTN- low BP    Governor Rooks Omarri Eich 01/14/2019, 11:38 AM

## 2019-01-14 NOTE — Care Management Important Message (Signed)
Important Message  Patient Details  Name: Charlotte [name] MRN: [id number] Date of Birth: 26-May-1941   Medicare Important Message Given:  Yes    Tommy Medal 01/14/2019, 2:00 PM

## 2019-01-14 NOTE — Progress Notes (Signed)
ANTICOAGULATION CONSULT NOTE -   Pharmacy Consult for warfarin Indication: atrial fibrillation  No Known Allergies  Patient Measurements: Height: 6' (182.9 cm) Weight: 157 lb 6.5 oz (71.4 kg) IBW/kg (Calculated) : 73.1   Vital Signs: Temp: 97.5 F (36.4 C) (06/12 0732) Temp Source: Oral (06/12 0732) BP: 98/53 (06/12 0700) Pulse Rate: 106 (06/12 0700)  Labs: Recent Labs    01/12/19 0415 01/13/19 0436 01/14/19 0452  HGB 8.2* 8.0* 8.2*  HCT 25.8* 25.1* 25.8*  PLT 250 247 286  LABPROT 39.7* 21.9* 21.1*  INR 4.2* 1.9* 1.9*  CREATININE 2.02* 2.24* 2.46*    Estimated Creatinine Clearance: 21.9 mL/min (A) (by C-G formula based on SCr of 2.46 mg/dL (H)).  Assessment: Pharmacy consulted to dose warfarin for this 78 yo female with atrial fibrillation on chronic anti-coagulation with warfarin.  Her home dose of warfarin is 2.5mg  on Sun/Tues/Thurs  and 5mg  on Mon/Wed/Fri/Sat.    INR 1.9- Vitamin K given 6/10. Warfarin dose 6/11 not given for unknown reason.  Patient on amiodarone- can increase INR  Goal of Therapy:  INR 2-3 Monitor platelets by anticoagulation protocol: Yes   Plan:  Warfarin 4 mg x 1 dose. Monitor daily INR and s/s of bleeding.  Margot Ables, PharmD Clinical Pharmacist 01/14/2019 10:30 AM

## 2019-01-14 NOTE — Progress Notes (Signed)
Physical Therapy Treatment Patient Details Name: Charlotte [name] MRN: [id number] DOB: [birth year] Today's Date: 01/14/2019    History of Present Illness Charlotte [name] is a 78 y.o. female with medical history significant for but not limited to chronic atrial fibrillation on anticoagulation with Coumadin, history of right leg cellulitis, history of combined CHF, history of COPD chronically on 4 L of home O2, anxiety and depression, essential hypertension, history of DVT, hyperlipidemia, hypothyroidism, history of iron deficiency anemia due to chronic blood loss, deafness and extremely hard of hearing, peripheral neuropathy, peripheral vascular disease, polycythemia vera, and other multiple medical comorbidities who presents with a 3 to 4-day history of dyspnea is progressively gotten worse    PT Comments    Pt supine in bed and willing to participate.  Vitals assessed through session with O2 saturation from 86-96% with HFNC.  Pt with min A with bed mobility and A required to prevent lean to Rt upon sitting, increased A required per fatigue.  Verbal and tactile cueing for hand placement to assist with seated posture.  EOS pt left in chair.  Assisted RN to slide up towards head of bed.  Pt presents with some irritation to Rt UE IV, RN aware.   Follow Up Recommendations  SNF     Equipment Recommendations  None recommended by PT    Recommendations for Other Services       Precautions / Restrictions Precautions Precaution Comments: Monitor HR, PVC, and oxygen stats Restrictions Weight Bearing Restrictions: No    Mobility  Bed Mobility   Bed Mobility: Rolling;Sidelying to Sit Rolling: Min assist   Supine to sit: Min assist Sit to supine: Min assist   General bed mobility comments: Rt side to sit with min A, verbal cueing for hand placement to assist to sitting.  Pt limited by fatigue with sitting, required assistance to prevent lean to Rt.  Transfers                     Ambulation/Gait                 Stairs             Wheelchair Mobility    Modified Rankin (Stroke Patients Only)       Balance                                            Cognition Arousal/Alertness: Awake/alert Behavior During Therapy: WFL for tasks assessed/performed Overall Cognitive Status: Within Functional Limits for tasks assessed                                        Exercises Total Joint Exercises Ankle Circles/Pumps: Both;AAROM Other Exercises Other Exercises: sitting balance x 3 min wiht min A for Rt lean    General Comments        Pertinent Vitals/Pain Pain Assessment: No/denies pain    Home Living                      Prior Function            PT Goals (current goals can now be found in the care plan section)      Frequency    Min 3X/week  PT Plan      Co-evaluation              AM-PAC PT "6 Clicks" Mobility   Outcome Measure  Help needed turning from your back to your side while in a flat bed without using bedrails?: A Little Help needed moving from lying on your back to sitting on the side of a flat bed without using bedrails?: A Little Help needed moving to and from a bed to a chair (including a wheelchair)?: A Lot Help needed standing up from a chair using your arms (e.g., wheelchair or bedside chair)?: A Lot Help needed to walk in hospital room?: Total Help needed climbing 3-5 steps with a railing? : Total 6 Click Score: 12    End of Session Equipment Utilized During Treatment: Oxygen Activity Tolerance: Patient limited by fatigue Patient left: in bed;with call bell/phone within reach Nurse Communication: Mobility status PT Visit Diagnosis: Unsteadiness on feet (R26.81);Other abnormalities of gait and mobility (R26.89);Muscle weakness (generalized) (M62.81)     Time: 3532-9924 PT Time Calculation (min) (ACUTE ONLY): 23 min  Charges:  $Therapeutic  Activity: 23-37 mins                     [address], LPTA; CBIS [phone]  Aldona Lento 01/14/2019, 1:52 PM

## 2019-01-14 NOTE — Progress Notes (Signed)
Progress Note  Patient Name: Charlotte [name] Date of Encounter: 01/14/2019  Primary Cardiologist: Kate Sable, MD   Subjective   She appears fatigued.  She said she is feeling "okay ".  She denies chest pain and palpitations.  Inpatient Medications    Scheduled Meds: . amoxicillin-clavulanate  1 tablet Oral Q12H  . Chlorhexidine Gluconate Cloth  6 each Topical Daily  . gabapentin  100 mg Oral TID  . guaiFENesin  600 mg Oral BID  . ipratropium  0.5 mg Nebulization TID  . levalbuterol  0.63 mg Nebulization TID  . levothyroxine  125 mcg Oral Q0600  . mouth rinse  15 mL Mouth Rinse BID  . pantoprazole  40 mg Oral Daily  . pravastatin  10 mg Oral q1800  . warfarin  4 mg Oral Once  . Warfarin - Pharmacist Dosing Inpatient   Does not apply q1800   Continuous Infusions: . amiodarone 30 mg/hr (01/14/19 0634)  . diltiazem (CARDIZEM) infusion 5 mg/hr (01/14/19 0634)   PRN Meds: acetaminophen **OR** acetaminophen, bisacodyl, ondansetron **OR** ondansetron (ZOFRAN) IV, senna-docusate, traMADol   Vital Signs    Vitals:   01/14/19 1135 01/14/19 1200 01/14/19 1300 01/14/19 1347  BP:  103/63 122/84   Pulse:  100 (!) 102 (!) 106  Resp:  (!) 26 (!) 28   Temp: 99.1 F (37.3 C)     TempSrc: Oral     SpO2:  91% 93%   Weight:      Height:        Intake/Output Summary (Last 24 hours) at 01/14/2019 1349 Last data filed at 01/14/2019 1300 Gross per 24 hour  Intake 994.92 ml  Output 1050 ml  Net -55.08 ml   Filed Weights   01/12/19 0500 01/13/19 0403 01/14/19 0500  Weight: 72.5 kg 73.4 kg 71.4 kg    Telemetry    Atrial fibrillation, PVCs, heart rate mid 90 to low 100 bpm range- Personally Reviewed  ECG    Atrial fibrillation, heart rate 106 bpm, PVCs- Personally Reviewed  Physical Exam   GEN:  Chronically ill-appearing. Neck: No JVD Cardiac:  Irregular rhythm, or gallops.  Respiratory:  Diminished breath sounds bilaterally. GI: Soft, nontender, non-distended   MS:  Lower extremity boots noted; No deformity. Neuro:  Nonfocal.  Hard of hearing. Psych: Normal affect   Labs    Chemistry Recent Labs  Lab 01/10/19 0839 01/11/19 0555 01/12/19 0415 01/13/19 0436 01/14/19 0452  NA 135 136 134* 134* 134*  K 3.8 4.3 4.5 4.3 4.3  CL 105 106 102 103 102  CO2 21* 19* 18* 22 22  GLUCOSE 95 74 81 103* 91  BUN 55* 59* 69* 77* 84*  CREATININE 1.65* 1.80* 2.02* 2.24* 2.46*  CALCIUM 8.4* 8.2* 8.1* 8.2* 8.1*  PROT 5.8* 5.6* 5.7*  --   --   ALBUMIN 3.1* 2.9* 2.8*  --  2.7*  AST 12* 11* 11*  --   --   ALT 7 8 8   --   --   ALKPHOS 60 54 58  --   --   BILITOT 1.5* 1.5* 1.6*  --   --   GFRNONAA 30* 27* 23* 21* 18*  GFRAA 35* 31* 27* 24* 21*  ANIONGAP 9 11 14 9 10      Hematology Recent Labs  Lab 01/12/19 0415 01/13/19 0436 01/14/19 0452  WBC 9.2 12.0* 11.4*  RBC 2.02* 1.95* 2.03*  HGB 8.2* 8.0* 8.2*  HCT 25.8* 25.1* 25.8*  MCV 127.7* 128.7* 127.1*  MCH 40.6* 41.0* 40.4*  MCHC 31.8 31.9 31.8  RDW 20.8* 21.5* 21.1*  PLT 250 247 286    Cardiac Enzymes Recent Labs  Lab 01/19/2019 1200 01/11/2019 1423 01/11/2019 2137 01/09/19 0330  TROPONINI <0.03 0.03* <0.03 <0.03   No results for input(s): TROPIPOC in the last 168 hours.   BNP Recent Labs  Lab 01/04/2019 1426  BNP 874.0*     DDimer  Recent Labs  Lab 01/12/2019 1426  DDIMER 0.28     Radiology    Dg Chest Port 1 View  Result Date: 01/14/2019 CLINICAL DATA:  Right-sided pleural effusion EXAM: PORTABLE CHEST 1 VIEW COMPARISON:  01/13/2019 FINDINGS: Cardiac shadow is enlarged but stable. Bilateral pleural effusions are noted left greater than right with underlying atelectasis/infiltrate. No pneumothorax is noted. IMPRESSION: Stable appearance of the chest from the previous day. Electronically Signed   By: Inez Catalina M.D.   On: 01/14/2019 07:33   Dg Chest Port 1 View  Result Date: 01/13/2019 CLINICAL DATA:  Follow-up pneumothorax. EXAM: PORTABLE CHEST 1 VIEW COMPARISON:  01/11/2019  and 01/10/2019 FINDINGS: Patient is moderately rotated to the right. Lungs are adequately inflated and demonstrate interval worsening of moderate bilateral perihilar and bibasilar opacification likely interstitial edema with moderate bilateral effusions left greater than right. There is likely associated atelectasis in the lung bases. No definite left apical pneumothorax visualized. Stable cardiomegaly. Remainder of the exam is unchanged. IMPRESSION: Previously seen tiny left apical pneumothorax not currently visualized. Interval worsening bilateral perihilar and bibasilar opacification likely interstitial edema with moderate size effusions left greater than right. Likely associated basilar atelectasis. Infection in the lung bases is possible. Electronically Signed   By: Marin Olp M.D.   On: 01/13/2019 14:06    Cardiac Studies   Echocardiogram 01/09/2019: 1. The left ventricle has normal systolic function with an ejection fraction of 60-65%. The cavity size was normal. Left ventricular diastolic function could not be evaluated secondary to atrial fibrillation. 2. The right ventricle has severely reduced systolic function. The cavity was moderately enlarged. There is no increase in right ventricular wall thickness. Right ventricular systolic pressure is moderately elevated with an estimated pressure of 49.1  mmHg. 3. Left atrial size was severely dilated. 4. Right atrial size was severely dilated. 5. Large pleural effusion. 6. Trivial pericardial effusion is present. 7. No evidence of mitral valve stenosis. 8. No stenosis of the aortic valve. 9. The aortic root and ascending aorta are normal in size and structure. 10. The inferior vena cava was normal in size with <50% respiratory variability. 11. The interatrial septum was not assessed.  Patient Profile     78 y.o. female with a history of permanent atrial fibrillation, chronic diastolic heart failure, PAD, hypertension, polycythemia  vera,andCOPD with chronic hypoxic respiratory failurebeing managed for acute on chronic diastolic heart failure with rapid atrial fibrillation over baseline.  Assessment & Plan    1.  Permanent atrial fibrillation with RVR: Currently on IV diltiazem 5 mg/h and IV amiodarone (temporary).  Heart rate in the mid 90/low 100 bpm range.  Warfarin is being administered with pharmacy consultation.  She received 4 mg yesterday.  INR remains subtherapeutic at 1.9 today.  2.  Acute on chronic diastolic heart failure: LVEF 60 to 65%.  Diuretics on hold due to progressive acute renal failure.  Evaluated by nephrology today.  Likely due to ischemic acute tubular necrosis in the setting of decompensated heart failure and rapid atrial fibrillation with hypotension.  She is not a candidate  for hemodialysis due to advanced age and multiple comorbidities.  May need to restart Lasix tomorrow.  Status post left thoracentesis with 1.2 L removed.  Possible right-sided thoracentesis planned for today.  3.  Pneumonia: Being treated with Augmentin.  4.  Severe right ventricular dysfunction: Due to chronic lung disease.  There is moderate pulmonary hypertension.  5.  Symptomatic anemia: Hemoglobin 8.2 today.  Likely secondary to GI bleed.  INR had been supratherapeutic at 4.2.  Currently 1.9 today.  Warfarin resumed by pharmacy.  6.  Peripheral vascular disease: Follows with vascular surgery.  On statin therapy.  7.  Hyperlipidemia: On statin therapy.  For questions or updates, please contact [place] Please consult [email or web address] for contact info under Cardiology/STEMI.      Signed, Kate Sable, MD  01/14/2019, 1:49 PM

## 2019-01-14 NOTE — Progress Notes (Signed)
OT Cancellation Note  Patient Details Name: WILDA WETHERELL MRN: [id number] DOB: 10/08/1940   Cancelled Treatment:    Reason Eval/Treat Not Completed: Patient at procedure or test/ unavailable. RT staff working with pt, will check back later as schedule permits.    Guadelupe Sabin, OTR/L  [phone] 01/14/2019, 8:08 AM

## 2019-01-14 NOTE — Progress Notes (Signed)
PROGRESS NOTE  CARSYN BOSTER  FAO:[id number]  DOB: April 02, 1941  DOA: 01/06/2019 PCP: [name], MD  Brief Admission Hx: 78 year old female with chronic atrial fibrillation fully anticoagulated with warfarin, combined CHF, COPD, oxygen dependent, hypertension, hypothyroidism and hyperlipidemia, deafness, polycythemia vera and other multiple medical comorbidities presented with severe shortness of breath, uncontrolled heart rate A. fib RVR.  MDM/Assessment & Plan:   1. Large bilateral pleural effusions with significant atelectasis- patient is status post left thoracentesis with 1200 mL of fluid removed.  Dr Thornton Papas says that he cannot tap right side if pneumothorax on left is not resolved.  CXR suggesting small pneumothorax has resolved.  Dr. Thornton Papas will review films today and determine if he can safely attempt another thoracentesis.    2. Acute on chronic respiratory failure with hypoxia -patient was volume overloaded and is status post thoracentesis with some improvement in symptoms however.  She remains in the stepdown unit.  CT of the chest revealed congestive heart failure and large pleural effusions.  SARS-CoV-2 test is negative.  Continue diuresis with IV Lasix.  Continue supportive therapy. 3. Pneumonia - xray suggesting pneumonia vs atelectasis - with bump in WBC will treat with augmentin 875 mg BID x 3 days.  4. Atrial fibrillation with RVR-pt is treated with IV amiodarone and Cardizem per cardiology service.  Appreciate the assistance of the heart care team.  Heart rate is better controlled at this time. 5. Small apical pneumothorax- status post thoracentesis.  Repeat imaging suggest that this is resolved.  Continue to monitor closely.   6. Hypothyroidism -Continue home levothyroxine. 7. AKI on CKD stage 4 - Pt having some worsening CKD with diuresis, agree with cards that we should get nephrology involved, will ask them to see patient.   8. Hyperlipidemia-continue pravastatin daily.  9. Acute combined CHF exacerbation- temporarily held IV Lasix due to bump in creatinine.  Continue to monitor intake and output closely.  Continue to monitor electrolytes closely.  Appreciate the assistance of the cardiology services. 10. Polycythemia vera - pt has been seen by hematologist Dr. Delton Coombes who has recommended temporarily holding hydroxyurea while recovering from critical illness.  11. Hyperbilirubinemia - Holding stable, GI aware and would only be concerned if it rises above 3-4 range and /or transaminases bump.  12. GERD - continue protonix daily.   13. PVD - She is followed by Dr. [name] with vascular surgery and recently s/p amputation of toe.   DVT prophylaxis: warfarin Code Status: DNR Family Communication: telephone update to son  Disposition Plan: continue care in SDU   Consultants:  Cardiology  Hematology  Radiology  GI   Procedures:  Echocardiogram  ECHOCARDIOGRAM on 01/09/2019 IMPRESSIONS   1. The left ventricle has normal systolic function with an ejection fraction of 60-65%. The cavity size was normal. Left ventricular diastolic function could not be evaluated secondary to atrial fibrillation. 2. The right ventricle has severely reduced systolic function. The cavity was moderately enlarged. There is no increase in right ventricular wall thickness. Right ventricular systolic pressure is moderately elevated with an estimated pressure of 49.1  mmHg. 3. Left atrial size was severely dilated. 4. Right atrial size was severely dilated. 5. Large pleural effusion. 6. Trivial pericardial effusion is present. 7. No evidence of mitral valve stenosis. 8. No stenosis of the aortic valve. 9. The aortic root and ascending aorta are normal in size and structure. 10. The inferior vena cava was normal in size with <50% respiratory variability. 11. The interatrial septum was not  assessed.  FINDINGS Left Ventricle: The left ventricle has normal systolic  function, with an ejection fraction of 60-65%. The cavity size was normal. There is no increase in left ventricular wall thickness. Left ventricular diastolic function could not be evaluated  secondary to atrial fibrillation.  Right Ventricle: The right ventricle has severely reduced systolic function. The cavity was moderately enlarged. There is no increase in right ventricular wall thickness. Right ventricular systolic pressure is moderately elevated with an estimated  pressure of 49.1 mmHg. Pacing wire/catheter visualized in the right ventricle.  Left Atrium: Left atrial size was severely dilated.  Right Atrium: Right atrial size was severely dilated. Right atrial pressure is estimated at 3 mmHg.  Interatrial Septum: The interatrial septum was not assessed.  Pericardium: Trivial pericardial effusion is present. There is a large pleural effusion.  Mitral Valve: The mitral valve is normal in structure. Mitral valve regurgitation is trivial by color flow Doppler. No evidence of mitral valve stenosis.  Tricuspid Valve: The tricuspid valve is normal in structure. Tricuspid valve regurgitation is mild by color flow Doppler.  Aortic Valve: The aortic valve is normal in structure. Aortic valve regurgitation was not visualized by color flow Doppler. There is No stenosis of the aortic valve.  Pulmonic Valve: The pulmonic valve was grossly normal. Pulmonic valve regurgitation is trivial by color flow Doppler.  Aorta: The aortic root and ascending aorta are normal in size and structure.  Venous: The inferior vena cava is normal in size with less than 50% respiratory variability.  Antimicrobials:    Subjective: Patient says that her breathing is about the same.  She denies chest pain, She is a bit more confused today.  She remains lethargic but easily arousable and very cooperative.    Objective: Vitals:   01/14/19 0600 01/14/19 0630 01/14/19 0700 01/14/19 0732  BP: (!) 110/51  98/63 (!) 98/53   Pulse: 99 (!) 106 (!) 106   Resp: (!) 35 (!) 30 (!) 21   Temp:    (!) 97.5 F (36.4 C)  TempSrc:    Oral  SpO2: 91% 95% 97%   Weight:      Height:        Intake/Output Summary (Last 24 hours) at 01/14/2019 0932 Last data filed at 01/14/2019 0800 Gross per 24 hour  Intake 934.92 ml  Output 900 ml  Net 34.92 ml   Filed Weights   01/12/19 0500 01/13/19 0403 01/14/19 0500  Weight: 72.5 kg 73.4 kg 71.4 kg   REVIEW OF SYSTEMS  As per history otherwise all reviewed and reported negative  Exam:  General exam: Chronically ill-appearing female she lethargic but easily arousable.  She is cooperative and pleasant. NAD.  Respiratory system:  No increased work of breathing. Cardiovascular system: S1 & S2 heard.  Irregularly irregular.  No JVD, murmurs, gallops, clicks or pedal edema. Gastrointestinal system: Abdomen is nondistended, soft and nontender. Normal bowel sounds heard. Central nervous system: Alert and oriented. No focal neurological deficits. Extremities: 1+ pretibial edema bilateral lower extremities.  Data Reviewed: Basic Metabolic Panel: Recent Labs  Lab 01/09/19 0331  01/10/19 0515 01/10/19 7902 01/11/19 0555 01/12/19 0415 01/13/19 0436 01/14/19 0452  NA 136   < >  --  135 136 134* 134* 134*  K 4.2   < >  --  3.8 4.3 4.5 4.3 4.3  CL 108   < >  --  105 106 102 103 102  CO2 21*   < >  --  21* 19* 18*  22 22  GLUCOSE 109*   < >  --  95 74 81 103* 91  BUN 53*   < >  --  55* 59* 69* 77* 84*  CREATININE 1.63*   < >  --  1.65* 1.80* 2.02* 2.24* 2.46*  CALCIUM 8.3*   < >  --  8.4* 8.2* 8.1* 8.2* 8.1*  MG 2.3  --  2.1  --  2.2 2.2  --  2.4  PHOS 4.2  --  4.1  --  4.9* 5.7*  --  5.0*   < > = values in this interval not displayed.   Liver Function Tests: Recent Labs  Lab 01/09/19 0331 01/09/19 1933 01/10/19 0839 01/11/19 0555 01/12/19 0415 01/14/19 0452  AST 13* 13* 12* 11* 11*  --   ALT 9 9 7 8 8   --   ALKPHOS 65 64 60 54 58  --   BILITOT  1.6* 1.4* 1.5* 1.5* 1.6*  --   PROT 6.0* 6.3* 5.8* 5.6* 5.7*  --   ALBUMIN 3.3* 3.4* 3.1* 2.9* 2.8* 2.7*   No results for input(s): LIPASE, AMYLASE in the last 168 hours. No results for input(s): AMMONIA in the last 168 hours. CBC: Recent Labs  Lab 01/09/19 0331 01/10/19 0515 01/11/19 0555 01/12/19 0415 01/13/19 0436 01/14/19 0452  WBC 5.9 5.4 5.6 9.2 12.0* 11.4*  NEUTROABS 4.6 4.3 4.8 8.1*  --   --   HGB 8.5* 8.5* 8.2* 8.2* 8.0* 8.2*  HCT 27.2* 27.9* 26.2* 25.8* 25.1* 25.8*  MCV 132.7* 134.1* 130.3* 127.7* 128.7* 127.1*  PLT 195 207 252 250 247 286   Cardiac Enzymes: Recent Labs  Lab 01/16/2019 1200 01/16/2019 1423 01/23/2019 2137 01/09/19 0330  TROPONINI <0.03 0.03* <0.03 <0.03   CBG (last 3)  Recent Labs    01/12/19 0729 01/13/19 0719 01/14/19 0736  GLUCAP 86 103* 86   Recent Results (from the past 240 hour(s))  Blood culture (routine x 2)     Status: None   Collection Time: 01/07/2019 12:00 PM   Specimen: BLOOD RIGHT ARM  Result Value Ref Range Status   Specimen Description BLOOD RIGHT ARM  Final   Special Requests   Final    Blood Culture adequate volume BOTTLES DRAWN AEROBIC AND ANAEROBIC   Culture   Final    NO GROWTH 5 DAYS Performed at [facility], [address], [place], [place] [address]    Report Status 01/13/2019 FINAL  Final  SARS Coronavirus 2 (CEPHEID - Performed in [place] hospital lab), Hosp Order     Status: None   Collection Time: 01/10/2019 12:17 PM   Specimen: Nasopharyngeal Swab  Result Value Ref Range Status   SARS Coronavirus 2 NEGATIVE NEGATIVE Final    Comment: (NOTE) If result is NEGATIVE SARS-CoV-2 target nucleic acids are NOT DETECTED. The SARS-CoV-2 RNA is generally detectable in upper and lower  respiratory specimens during the acute phase of infection. The lowest  concentration of SARS-CoV-2 viral copies this assay can detect is 250  copies / mL. A negative result does not preclude SARS-CoV-2 infection  and should not be used  as the sole basis for treatment or other  patient management decisions.  A negative result may occur with  improper specimen collection / handling, submission of specimen other  than nasopharyngeal swab, presence of viral mutation(s) within the  areas targeted by this assay, and inadequate number of viral copies  (<250 copies / mL). A negative result must be combined with clinical  observations, patient history, and epidemiological information. If result is POSITIVE SARS-CoV-2 target nucleic acids are DETECTED. The SARS-CoV-2 RNA is generally detectable in upper and lower  respiratory specimens dur ing the acute phase of infection.  Positive  results are indicative of active infection with SARS-CoV-2.  Clinical  correlation with patient history and other diagnostic information is  necessary to determine patient infection status.  Positive results do  not rule out bacterial infection or co-infection with other viruses. If result is PRESUMPTIVE POSTIVE SARS-CoV-2 nucleic acids MAY BE PRESENT.   A presumptive positive result was obtained on the submitted specimen  and confirmed on repeat testing.  While 2019 novel coronavirus  (SARS-CoV-2) nucleic acids may be present in the submitted sample  additional confirmatory testing may be necessary for epidemiological  and / or clinical management purposes  to differentiate between  SARS-CoV-2 and other Sarbecovirus currently known to infect humans.  If clinically indicated additional testing with an alternate test  methodology [phone]) is advised. The SARS-CoV-2 RNA is generally  detectable in upper and lower respiratory sp ecimens during the acute  phase of infection. The expected result is Negative. Fact Sheet for Patients:  StrictlyIdeas.no Fact Sheet for Healthcare Providers: BankingDealers.co.za This test is not yet approved or cleared by the [place] FDA and has been authorized for  detection and/or diagnosis of SARS-CoV-2 by FDA under an Emergency Use Authorization (EUA).  This EUA will remain in effect (meaning this test can be used) for the duration of the COVID-19 declaration under Section 564(b)(1) of the Act, 21 U.S.C. section 360bbb-3(b)(1), unless the authorization is terminated or revoked sooner. Performed at [facility], [address] [place], [place] [address]   Blood culture (routine x 2)     Status: None   Collection Time: 01/22/2019 12:46 PM   Specimen: Left Antecubital; Blood  Result Value Ref Range Status   Specimen Description LEFT ANTECUBITAL  Final   Special Requests   Final    BOTTLES DRAWN AEROBIC AND ANAEROBIC Blood Culture adequate volume   Culture   Final    NO GROWTH 5 DAYS Performed at [facility], [address], [place], [place] [address]    Report Status 01/13/2019 FINAL  Final  MRSA PCR Screening     Status: None   Collection Time: 01/24/2019  7:57 PM   Specimen: Nasal Mucosa; Nasopharyngeal  Result Value Ref Range Status   MRSA by PCR NEGATIVE NEGATIVE Final    Comment:        The GeneXpert MRSA Assay (FDA approved for NASAL specimens only), is one component of a comprehensive MRSA colonization surveillance program. It is not intended to diagnose MRSA infection nor to guide or monitor treatment for MRSA infections. Performed at [facility], [address], [place], [place] [address]   Fungus Culture With Stain     Status: None (Preliminary result)   Collection Time: 01/10/19 12:42 PM  Result Value Ref Range Status   Fungus Stain Final report  Final    Comment: (NOTE) Performed At: [facility] [address] [place], [place] [id number] Rush Farmer MD JM:[id number]    Fungus (Mycology) Culture PENDING  Incomplete   Fungal Source PLEURAL  Final    Comment: Performed at [facility], [address], [place], [place] [address]  Fungus Culture Result     Status: None   Collection Time: 01/10/19 12:42 PM  Result  Value Ref Range Status   Result 1 Comment  Final    Comment: (  NOTE) KOH/Calcofluor preparation:  no fungus observed. Performed At: [facility] [place], [place] [id number] Rush Farmer MD QQ:[id number]   Gram stain     Status: None   Collection Time: 01/10/19 12:43 PM   Specimen: Pleura; Body Fluid  Result Value Ref Range Status   Specimen Description PLEURAL  Final   Special Requests NONE  Final   Gram Stain   Final    CYTOSPIN SMEAR NO ORGANISMS SEEN WBC PRESENT,BOTH PMN AND MONONUCLEAR Performed at [facility], [address] [place], [place] [address]    Report Status 01/10/2019 FINAL  Final     Studies: Dg Chest Port 1 View  Result Date: 01/14/2019 CLINICAL DATA:  Right-sided pleural effusion EXAM: PORTABLE CHEST 1 VIEW COMPARISON:  01/13/2019 FINDINGS: Cardiac shadow is enlarged but stable. Bilateral pleural effusions are noted left greater than right with underlying atelectasis/infiltrate. No pneumothorax is noted. IMPRESSION: Stable appearance of the chest from the previous day. Electronically Signed   By: Inez Catalina M.D.   On: 01/14/2019 07:33   Dg Chest Port 1 View  Result Date: 01/13/2019 CLINICAL DATA:  Follow-up pneumothorax. EXAM: PORTABLE CHEST 1 VIEW COMPARISON:  01/11/2019 and 01/10/2019 FINDINGS: Patient is moderately rotated to the right. Lungs are adequately inflated and demonstrate interval worsening of moderate bilateral perihilar and bibasilar opacification likely interstitial edema with moderate bilateral effusions left greater than right. There is likely associated atelectasis in the lung bases. No definite left apical pneumothorax visualized. Stable cardiomegaly. Remainder of the exam is unchanged. IMPRESSION: Previously seen tiny left apical pneumothorax not currently visualized. Interval worsening bilateral perihilar and bibasilar opacification likely interstitial edema with moderate size effusions left greater than right. Likely  associated basilar atelectasis. Infection in the lung bases is possible. Electronically Signed   By: Marin Olp M.D.   On: 01/13/2019 14:06   Scheduled Meds: . amoxicillin-clavulanate  1 tablet Oral Q12H  . Chlorhexidine Gluconate Cloth  6 each Topical Daily  . gabapentin  100 mg Oral TID  . guaiFENesin  600 mg Oral BID  . ipratropium  0.5 mg Nebulization TID  . levalbuterol  0.63 mg Nebulization TID  . levothyroxine  125 mcg Oral Q0600  . mouth rinse  15 mL Mouth Rinse BID  . pantoprazole  40 mg Oral Daily  . pravastatin  10 mg Oral q1800  . warfarin  4 mg Oral ONCE-1800  . Warfarin - Pharmacist Dosing Inpatient   Does not apply q1800   Continuous Infusions: . amiodarone 30 mg/hr (01/14/19 0634)  . diltiazem (CARDIZEM) infusion 5 mg/hr (01/14/19 1884)    Active Problems:   Polycythemia vera (HCC)   Chronic atrial fibrillation   Anticoagulant long-term use   HTN (hypertension)   Hypothyroidism   PVD (peripheral vascular disease) (HCC)   SOB (shortness of breath)   Pleural effusion on right   Hypoxemia   CHF (congestive heart failure) (HCC)   Acute on chronic respiratory failure with hypoxia (HCC)   Anemia   Chronic respiratory failure with hypoxia (HCC)   Hyperlipidemia   Acute renal failure (ARF) (HCC)   Pressure ulcer   GERD (gastroesophageal reflux disease)   Iron deficiency anemia due to chronic blood loss   Atrial fibrillation with RVR ([place])   Pneumothorax on left   Status post thoracentesis  Critical CareTime spent: 34 minutes   Irwin Brakeman, MD [facility] 01/14/2019, 9:32 AM    LOS: 6 days  How to contact the [facility] Attending or Consulting provider  7A - 7P or covering provider during after hours [place], for this patient?  1. Check the care team in [facility] and look for a) attending/consulting TRH provider listed and b) the [facility] team listed 2. Log into [email or web address] and use [place]'s universal password to access. If you do not have the password,  please contact the hospital operator. 3. Locate the [facility] provider you are looking for under [facility] and page to a number that you can be directly reached. 4. If you still have difficulty reaching the provider, please page the [facility] (Director on Call) for the Hospitalists listed on amion for assistance.

## 2019-01-14 NOTE — Progress Notes (Signed)
Ok to place PICC line for iv access as she is not a dialysis candidate.

## 2019-01-14 NOTE — TOC Progression Note (Signed)
Transition of Care [facility]) - Progression Note    Patient Details  Name: SHANIECE BUSSA MRN: [id number] Date of Birth: Dec 13, 1940  Transition of Care [facility]) CM/SW Contact  Boneta Lucks, RN Phone Number: 01/14/2019, 2:20 PM  Clinical Narrative:   Pt will discharge over the weekend. [facility] is aware. CM will message MD for [facility] orders.     Expected Discharge Plan: [place] Barriers to Discharge: Continued Medical Work up  Expected Discharge Plan and Services Expected Discharge Plan: [place]   Discharge Planning Services: CM Consult Post Acute Care Choice: [place] arrangements for the past 2 months: Single Family Home                           HH Arranged: PT, RN [facility] Agency: [place] ([facility]) Date [place]: 01/10/19 Time [place]: [place] Representative spoke with at [place]: [place] ([place]) Interventions    Readmission Risk Interventions Readmission Risk Prevention Plan 01/13/2019  Transportation Screening Complete  HRI or [place] Complete  Social Work Consult for [place] Planning/Counseling [place] Not Complete  Medication Review Press photographer) Complete  Some recent data might be hidden

## 2019-01-15 LAB — RENAL FUNCTION PANEL
Albumin: 2.6 g/dL — ABNORMAL LOW (ref 3.5–5.0)
Anion gap: 17 — ABNORMAL HIGH (ref 5–15)
BUN: 90 mg/dL — ABNORMAL HIGH (ref 8–23)
CO2: 17 mmol/L — ABNORMAL LOW (ref 22–32)
Calcium: 8.2 mg/dL — ABNORMAL LOW (ref 8.9–10.3)
Chloride: 101 mmol/L (ref 98–111)
Creatinine, Ser: 2.64 mg/dL — ABNORMAL HIGH (ref 0.44–1.00)
GFR calc Af Amer: 20 mL/min — ABNORMAL LOW (ref >60)
GFR calc non Af Amer: 17 mL/min — ABNORMAL LOW (ref >60)
Glucose, Bld: 94 mg/dL (ref 70–99)
Phosphorus: 5.2 mg/dL — ABNORMAL HIGH (ref 2.5–4.6)
Potassium: 3.9 mmol/L (ref 3.5–5.1)
Sodium: 135 mmol/L (ref 135–145)

## 2019-01-15 LAB — CBC
HCT: 27.1 % — ABNORMAL LOW (ref 36.0–46.0)
Hemoglobin: 8.2 g/dL — ABNORMAL LOW (ref 12.0–15.0)
MCH: 38.9 pg — ABNORMAL HIGH (ref 26.0–34.0)
MCHC: 30.3 g/dL (ref 30.0–36.0)
MCV: 128.4 fL — ABNORMAL HIGH (ref 80.0–100.0)
Platelets: 277 10*3/uL (ref 150–400)
RBC: 2.11 MIL/uL — ABNORMAL LOW (ref 3.87–5.11)
RDW: 21 % — ABNORMAL HIGH (ref 11.5–15.5)
WBC: 11.2 10*3/uL — ABNORMAL HIGH (ref 4.0–10.5)
nRBC: 0.2 % (ref 0.0–0.2)

## 2019-01-15 LAB — GLUCOSE, CAPILLARY
Glucose-Capillary: 99 mg/dL (ref 70–99)
Glucose-Capillary: 110 mg/dL — ABNORMAL HIGH (ref 70–99)

## 2019-01-15 LAB — PROTIME-INR
INR: 2.5 — ABNORMAL HIGH (ref 0.8–1.2)
Prothrombin Time: 26.9 seconds — ABNORMAL HIGH (ref 11.4–15.2)

## 2019-01-15 MED ORDER — FUROSEMIDE 10 MG/ML IJ SOLN
80.0000 mg | Freq: Once | INTRAMUSCULAR | Status: AC
  Administered 2019-01-15: 80 mg via INTRAVENOUS
  Filled 2019-01-15: qty 8

## 2019-01-15 MED ORDER — FUROSEMIDE 10 MG/ML IJ SOLN: 40.0000 mg | Freq: Once | INTRAMUSCULAR | Status: DC

## 2019-01-15 NOTE — Progress Notes (Signed)
ANTICOAGULATION CONSULT NOTE -   Pharmacy Consult for warfarin Indication: atrial fibrillation  No Known Allergies  Patient Measurements: Height: 6' (182.9 cm) Weight: 153 lb 7 oz (69.6 kg) IBW/kg (Calculated) : 73.1   Vital Signs: Temp: 98.6 F (37 C) (06/13 0400) BP: 97/54 (06/13 0614) Pulse Rate: 86 (06/13 0614)  Labs: Recent Labs    01/13/19 0436 01/14/19 0452 01/15/19 0425  HGB 8.0* 8.2* 8.2*  HCT 25.1* 25.8* 27.1*  PLT 247 286 277  LABPROT 21.9* 21.1* 26.9*  INR 1.9* 1.9* 2.5*  CREATININE 2.24* 2.46* 2.64*    Estimated Creatinine Clearance: 19.9 mL/min (A) (by C-G formula based on SCr of 2.64 mg/dL (H)).  Assessment: Pharmacy consulted to dose warfarin for this 78 yo female with atrial fibrillation on chronic anti-coagulation with warfarin.  Her home dose of warfarin is 2.5mg  on Sun/Tues/Thurs  and 5mg  on Mon/Wed/Fri/Sat.    INR 1.9- Vitamin K given 6/10. Warfarin dose 6/11 not given for unknown reason.  Patient on amiodarone- can increase INR   6/13 INR 2.5, 4mg  given last night   Goal of Therapy:  INR 2-3 Monitor platelets by anticoagulation protocol: Yes   Plan:  Warfarin 2 mg x 1 dose. Monitor daily INR and s/s of bleeding.  Thomasenia Sales, PharmD, MBA, BCGP Clinical Pharmacist  01/15/2019 8:23 AM

## 2019-01-15 NOTE — Progress Notes (Signed)
PROGRESS NOTE  DRISHTI PEPPERMAN  LPF:[id number]  DOB: 1941-07-06  DOA: 01/06/2019 PCP: [name], MD  Brief Admission Hx: 78 year old female with chronic atrial fibrillation fully anticoagulated with warfarin, combined CHF, COPD, oxygen dependent, hypertension, hypothyroidism and hyperlipidemia, deafness, polycythemia vera and other multiple medical comorbidities presented with severe shortness of breath, uncontrolled heart rate A. fib RVR.  MDM/Assessment & Plan:   1. Large bilateral pleural effusions with significant atelectasis- patient is status post left thoracentesis with 1200 mL of fluid removed.  Dr Thornton Papas says that he cannot tap right side if pneumothorax on left is not resolved.  CXR suggesting small pneumothorax has resolved.  Pt is s/p right thoracentesis 6/12 where 1.1 L fluid was removed and xray shows no  Pneumothorax.  Pt says her breathing is a little better.      2. Acute on chronic respiratory failure with hypoxia -patient was volume overloaded and is status post thoracentesis with some improvement in symptoms however.  She remains in the stepdown unit.  CT of the chest revealed congestive heart failure and large pleural effusions.  SARS-CoV-2 test is negative.  Resume IV lasix today and she continues to have pulmonary edema.  Continue supportive therapy. 3. Pneumonia - xray suggesting pneumonia vs atelectasis - with bump in WBC will treat with augmentin 875 mg BID x 3 days.  4. Atrial fibrillation with RVR-pt is treated with IV amiodarone and Cardizem per cardiology service.  Appreciate the assistance of the heart care team.  Heart rate is better controlled at this time. 5. Small apical pneumothorax- status post thoracentesis.  Repeat imaging suggest that this is resolved.  Continue to monitor closely.   6. Hypothyroidism -Continue home levothyroxine. 7. AKI on CKD stage 4 - Pt having some worsening CKD, appreciate nephrology consult.   8. Hyperlipidemia-continue pravastatin  daily. 9. Acute combined CHF exacerbation- temporarily held IV Lasix due to bump in creatinine but with persistent pulmonary edema will restart IV lasix today.   Continue to monitor intake and output closely.  Continue to monitor electrolytes closely.  Appreciate the assistance of the cardiology and nephrology services. 10. Polycythemia vera - pt has been seen by hematologist Dr. Delton Coombes who has recommended temporarily holding hydroxyurea while recovering from critical illness.  11. Hyperbilirubinemia - Holding stable, GI aware and would only be concerned if it rises above 3-4 range and /or transaminases bump.  12. GERD - continue protonix daily.   13. PVD - She is followed by Dr. [name] with vascular surgery and recently s/p amputation of toe.   DVT prophylaxis: warfarin Code Status: DNR Family Communication: I had long discussion with husband today, he agrees that if patient does not have any meaningful improvement over the weekend then we should look into withdrawing full scope treatments and start to pursue comfort care measures.  Continue full scope treatments today and tomorrow and reassess.    Disposition Plan: continue care in SDU   Consultants:  Cardiology  Hematology  Radiology  GI   Procedures:  Echocardiogram  ECHOCARDIOGRAM on 01/09/2019 IMPRESSIONS  1. The left ventricle has normal systolic function with an ejection fraction of 60-65%. The cavity size was normal. Left ventricular diastolic function could not be evaluated secondary to atrial fibrillation. 2. The right ventricle has severely reduced systolic function. The cavity was moderately enlarged. There is no increase in right ventricular wall thickness. Right ventricular systolic pressure is moderately elevated with an estimated pressure of 49.1  mmHg. 3. Left atrial size was severely dilated.  4. Right atrial size was severely dilated. 5. Large pleural effusion. 6. Trivial pericardial effusion is present.  7. No evidence of mitral valve stenosis. 8. No stenosis of the aortic valve. 9. The aortic root and ascending aorta are normal in size and structure. 10. The inferior vena cava was normal in size with <50% respiratory variability. 11. The interatrial septum was not assessed.  FINDINGS Left Ventricle: The left ventricle has normal systolic function, with an ejection fraction of 60-65%. The cavity size was normal. There is no increase in left ventricular wall thickness. Left ventricular diastolic function could not be evaluated  secondary to atrial fibrillation.  Right Ventricle: The right ventricle has severely reduced systolic function. The cavity was moderately enlarged. There is no increase in right ventricular wall thickness. Right ventricular systolic pressure is moderately elevated with an estimated  pressure of 49.1 mmHg. Pacing wire/catheter visualized in the right ventricle.  Left Atrium: Left atrial size was severely dilated.  Right Atrium: Right atrial size was severely dilated. Right atrial pressure is estimated at 3 mmHg.  Interatrial Septum: The interatrial septum was not assessed.  Pericardium: Trivial pericardial effusion is present. There is a large pleural effusion.  Mitral Valve: The mitral valve is normal in structure. Mitral valve regurgitation is trivial by color flow Doppler. No evidence of mitral valve stenosis.  Tricuspid Valve: The tricuspid valve is normal in structure. Tricuspid valve regurgitation is mild by color flow Doppler.  Aortic Valve: The aortic valve is normal in structure. Aortic valve regurgitation was not visualized by color flow Doppler. There is No stenosis of the aortic valve.  Pulmonic Valve: The pulmonic valve was grossly normal. Pulmonic valve regurgitation is trivial by color flow Doppler.  Aorta: The aortic root and ascending aorta are normal in size and structure.  Venous: The inferior vena cava is normal in size with  less than 50% respiratory variability.  Antimicrobials:    Subjective: Patient says that her breathing is about the same.  She denies chest pain, She is a bit more confused today.  She remains lethargic but easily arousable and very cooperative.    Objective: Vitals:   01/15/19 0804 01/15/19 0837 01/15/19 0900 01/15/19 1202  BP:   101/60   Pulse:   (!) 103   Resp:   19   Temp:  97.6 F (36.4 C)  97.7 F (36.5 C)  TempSrc:  Axillary  Axillary  SpO2: 94%  100%   Weight:      Height:        Intake/Output Summary (Last 24 hours) at 01/15/2019 1204 Last data filed at 01/14/2019 1745 Gross per 24 hour  Intake 297.96 ml  Output 250 ml  Net 47.96 ml   Filed Weights   01/13/19 0403 01/14/19 0500 01/15/19 0500  Weight: 73.4 kg 71.4 kg 69.6 kg   REVIEW OF SYSTEMS  As per history otherwise all reviewed and reported negative  Exam:  General exam: Chronically ill-appearing female she lethargic but easily arousable.  She is cooperative and pleasant. NAD.  Respiratory system:  No increased work of breathing. Cardiovascular system: S1 & S2 heard.  Irregularly irregular.  No JVD, murmurs, gallops, clicks or pedal edema. Gastrointestinal system: Abdomen is nondistended, soft and nontender. Normal bowel sounds heard. Central nervous system: Alert and oriented. No focal neurological deficits. Extremities: 1+ pretibial edema bilateral lower extremities.  Data Reviewed: Basic Metabolic Panel: Recent Labs  Lab 01/09/19 0331  01/10/19 0515  01/11/19 5188 01/12/19 4166 01/13/19 0630 01/14/19 1601  01/15/19 0425  NA 136   < >  --    < > 136 134* 134* 134* 135  K 4.2   < >  --    < > 4.3 4.5 4.3 4.3 3.9  CL 108   < >  --    < > 106 102 103 102 101  CO2 21*   < >  --    < > 19* 18* 22 22 17*  GLUCOSE 109*   < >  --    < > 74 81 103* 91 94  BUN 53*   < >  --    < > 59* 69* 77* 84* 90*  CREATININE 1.63*   < >  --    < > 1.80* 2.02* 2.24* 2.46* 2.64*  CALCIUM 8.3*   < >  --    < > 8.2*  8.1* 8.2* 8.1* 8.2*  MG 2.3  --  2.1  --  2.2 2.2  --  2.4  --   PHOS 4.2  --  4.1  --  4.9* 5.7*  --  5.0* 5.2*   < > = values in this interval not displayed.   Liver Function Tests: Recent Labs  Lab 01/09/19 0331 01/09/19 1933 01/10/19 0839 01/11/19 0555 01/12/19 0415 01/14/19 0452 01/15/19 0425  AST 13* 13* 12* 11* 11*  --   --   ALT 9 9 7 8 8   --   --   ALKPHOS 65 64 60 54 58  --   --   BILITOT 1.6* 1.4* 1.5* 1.5* 1.6*  --   --   PROT 6.0* 6.3* 5.8* 5.6* 5.7*  --   --   ALBUMIN 3.3* 3.4* 3.1* 2.9* 2.8* 2.7* 2.6*   No results for input(s): LIPASE, AMYLASE in the last 168 hours. No results for input(s): AMMONIA in the last 168 hours. CBC: Recent Labs  Lab 01/09/19 0331 01/10/19 0515 01/11/19 0555 01/12/19 0415 01/13/19 0436 01/14/19 0452 01/15/19 0425  WBC 5.9 5.4 5.6 9.2 12.0* 11.4* 11.2*  NEUTROABS 4.6 4.3 4.8 8.1*  --   --   --   HGB 8.5* 8.5* 8.2* 8.2* 8.0* 8.2* 8.2*  HCT 27.2* 27.9* 26.2* 25.8* 25.1* 25.8* 27.1*  MCV 132.7* 134.1* 130.3* 127.7* 128.7* 127.1* 128.4*  PLT 195 207 252 250 247 286 277   Cardiac Enzymes: Recent Labs  Lab 01/07/2019 1423 01/30/2019 2137 01/09/19 0330  TROPONINI 0.03* <0.03 <0.03   CBG (last 3)  Recent Labs    01/14/19 0736 01/15/19 0749 01/15/19 1140  GLUCAP 86 99 110*   Recent Results (from the past 240 hour(s))  Blood culture (routine x 2)     Status: None   Collection Time: 01/18/2019 12:00 PM   Specimen: BLOOD RIGHT ARM  Result Value Ref Range Status   Specimen Description BLOOD RIGHT ARM  Final   Special Requests   Final    Blood Culture adequate volume BOTTLES DRAWN AEROBIC AND ANAEROBIC   Culture   Final    NO GROWTH 5 DAYS Performed at [facility], [address] [place],  [address]    Report Status 01/13/2019 FINAL  Final  SARS Coronavirus 2 (CEPHEID - Performed in [place] hospital lab), Hosp Order     Status: None   Collection Time: 01/22/2019 12:17 PM   Specimen: Nasopharyngeal Swab  Result  Value Ref Range Status   SARS Coronavirus 2 NEGATIVE NEGATIVE Final    Comment: (NOTE) If result is NEGATIVE SARS-CoV-2  target nucleic acids are NOT DETECTED. The SARS-CoV-2 RNA is generally detectable in upper and lower  respiratory specimens during the acute phase of infection. The lowest  concentration of SARS-CoV-2 viral copies this assay can detect is 250  copies / mL. A negative result does not preclude SARS-CoV-2 infection  and should not be used as the sole basis for treatment or other  patient management decisions.  A negative result may occur with  improper specimen collection / handling, submission of specimen other  than nasopharyngeal swab, presence of viral mutation(s) within the  areas targeted by this assay, and inadequate number of viral copies  (<250 copies / mL). A negative result must be combined with clinical  observations, patient history, and epidemiological information. If result is POSITIVE SARS-CoV-2 target nucleic acids are DETECTED. The SARS-CoV-2 RNA is generally detectable in upper and lower  respiratory specimens dur ing the acute phase of infection.  Positive  results are indicative of active infection with SARS-CoV-2.  Clinical  correlation with patient history and other diagnostic information is  necessary to determine patient infection status.  Positive results do  not rule out bacterial infection or co-infection with other viruses. If result is PRESUMPTIVE POSTIVE SARS-CoV-2 nucleic acids MAY BE PRESENT.   A presumptive positive result was obtained on the submitted specimen  and confirmed on repeat testing.  While 2019 novel coronavirus  (SARS-CoV-2) nucleic acids may be present in the submitted sample  additional confirmatory testing may be necessary for epidemiological  and / or clinical management purposes  to differentiate between  SARS-CoV-2 and other Sarbecovirus currently known to infect humans.  If clinically indicated additional testing  with an alternate test  methodology [phone]) is advised. The SARS-CoV-2 RNA is generally  detectable in upper and lower respiratory sp ecimens during the acute  phase of infection. The expected result is Negative. Fact Sheet for Patients:  StrictlyIdeas.no Fact Sheet for Healthcare Providers: BankingDealers.co.za This test is not yet approved or cleared by the [place] FDA and has been authorized for detection and/or diagnosis of SARS-CoV-2 by FDA under an Emergency Use Authorization (EUA).  This EUA will remain in effect (meaning this test can be used) for the duration of the COVID-19 declaration under Section 564(b)(1) of the Act, 21 U.S.C. section 360bbb-3(b)(1), unless the authorization is terminated or revoked sooner. Performed at [facility], [address], [place], [place] [address]   Blood culture (routine x 2)     Status: None   Collection Time: 01/13/2019 12:46 PM   Specimen: Left Antecubital; Blood  Result Value Ref Range Status   Specimen Description LEFT ANTECUBITAL  Final   Special Requests   Final    BOTTLES DRAWN AEROBIC AND ANAEROBIC Blood Culture adequate volume   Culture   Final    NO GROWTH 5 DAYS Performed at [facility], [address] [place], [place] [address]    Report Status 01/13/2019 FINAL  Final  MRSA PCR Screening     Status: None   Collection Time: 01/13/2019  7:57 PM   Specimen: Nasal Mucosa; Nasopharyngeal  Result Value Ref Range Status   MRSA by PCR NEGATIVE NEGATIVE Final    Comment:        The GeneXpert MRSA Assay (FDA approved for NASAL specimens only), is one component of a comprehensive MRSA colonization surveillance program. It is not intended to diagnose MRSA infection nor to guide or monitor treatment for MRSA infections. Performed at [facility], [address], [place], [place]  [address]   Fungus Culture With Stain     Status: None (Preliminary result)   Collection Time:  01/10/19 12:42 PM  Result Value Ref Range Status   Fungus Stain Final report  Final    Comment: (NOTE) Performed At: [facility] [place], [place] [id number] Rush Farmer MD FB:[id number]    Fungus (Mycology) Culture PENDING  Incomplete   Fungal Source PLEURAL  Final    Comment: Performed at [facility], [address] [place], [place] [address]  Fungus Culture Result     Status: None   Collection Time: 01/10/19 12:42 PM  Result Value Ref Range Status   Result 1 Comment  Final    Comment: (NOTE) KOH/Calcofluor preparation:  no fungus observed. Performed At: [facility] [place], [place] [id number] Rush Farmer MD QM:[id number]   Gram stain     Status: None   Collection Time: 01/10/19 12:43 PM   Specimen: Pleura; Body Fluid  Result Value Ref Range Status   Specimen Description PLEURAL  Final   Special Requests NONE  Final   Gram Stain   Final    CYTOSPIN SMEAR NO ORGANISMS SEEN WBC PRESENT,BOTH PMN AND MONONUCLEAR Performed at [facility], [address], [place], [place] [address]    Report Status 01/10/2019 FINAL  Final  Culture, body fluid-bottle     Status: None (Preliminary result)   Collection Time: 01/14/19  2:38 PM   Specimen: Pleura  Result Value Ref Range Status   Specimen Description PLEURAL  Final   Special Requests BOTTLES DRAWN AEROBIC AND ANAEROBIC 10 CC EACH  Final   Culture   Final    NO GROWTH < 24 HOURS Performed at [facility], [address], [place], [place] [address]    Report Status PENDING  Incomplete  Gram stain     Status: None   Collection Time: 01/14/19  2:38 PM   Specimen: Pleural, Right; Pleural Fluid  Result Value Ref Range Status   Specimen Description PLEURAL  Final   Special Requests NONE  Final   Gram Stain   Final    CYTOSPIN SMEAR NO ORGANISMS SEEN WBC PRESENT,BOTH PMN AND MONONUCLEAR Performed at [facility], [address], [place], [place] [address]    Report Status  01/14/2019 FINAL  Final     Studies: US Renal  Result Date: 01/14/2019 CLINICAL DATA:  Acute renal failure, history COPD, hypertension, peripheral vascular disease EXAM: RENAL / URINARY TRACT ULTRASOUND COMPLETE COMPARISON:  CT abdomen and pelvis 03/17/2017 FINDINGS: Right Kidney: Renal measurements: 9.1 x 4.4 x 5.3 cm = volume: 110 mL. Cortical thinning. Mildly increased cortical echogenicity likely present. No mass or hydronephrosis. Left Kidney: Renal measurements: 9.4 x 3.3 x 4.9 cm = volume: 79 mL. Cortical thinning. Increased cortical echogenicity. Mild dilatation of renal pelvis. No calyceal dilatation. No mass or shadowing calcification. Bladder: Contains only a small amount of urine, grossly unremarkable IMPRESSION: Mild LEFT renal pelviectasis without calyceal dilatation. Suspected mild medical renal disease changes of both kidneys. Electronically Signed   By: Lavonia Dana M.D.   On: 01/14/2019 16:11   Dg Chest Port 1 View  Result Date: 01/14/2019 CLINICAL DATA:  BILATERAL pleural effusions, post RIGHT thoracentesis EXAM: PORTABLE CHEST 1 VIEW COMPARISON:  Portable exam 1530 hours compared to 0518 hours FINDINGS: Enlargement of cardiac silhouette with pulmonary vascular congestion. Persistent LEFT pleural effusion and basilar atelectasis. Mild pulmonary edema. Decreased RIGHT pleural effusion and basilar atelectasis post thoracentesis. No pneumothorax. IMPRESSION: No  pneumothorax following RIGHT thoracentesis. Enlargement of cardiac silhouette with pulmonary vascular congestion, pulmonary edema, and persistent LEFT basilar atelectasis/effusion. Electronically Signed   By: Lavonia Dana M.D.   On: 01/14/2019 15:48   Dg Chest Port 1 View  Result Date: 01/14/2019 CLINICAL DATA:  Right-sided pleural effusion EXAM: PORTABLE CHEST 1 VIEW COMPARISON:  01/13/2019 FINDINGS: Cardiac shadow is enlarged but stable. Bilateral pleural effusions are noted left greater than right with underlying  atelectasis/infiltrate. No pneumothorax is noted. IMPRESSION: Stable appearance of the chest from the previous day. Electronically Signed   By: Inez Catalina M.D.   On: 01/14/2019 07:33   Dg Chest Port 1 View  Result Date: 01/13/2019 CLINICAL DATA:  Follow-up pneumothorax. EXAM: PORTABLE CHEST 1 VIEW COMPARISON:  01/11/2019 and 01/10/2019 FINDINGS: Patient is moderately rotated to the right. Lungs are adequately inflated and demonstrate interval worsening of moderate bilateral perihilar and bibasilar opacification likely interstitial edema with moderate bilateral effusions left greater than right. There is likely associated atelectasis in the lung bases. No definite left apical pneumothorax visualized. Stable cardiomegaly. Remainder of the exam is unchanged. IMPRESSION: Previously seen tiny left apical pneumothorax not currently visualized. Interval worsening bilateral perihilar and bibasilar opacification likely interstitial edema with moderate size effusions left greater than right. Likely associated basilar atelectasis. Infection in the lung bases is possible. Electronically Signed   By: Marin Olp M.D.   On: 01/13/2019 14:06   US Thoracentesis Asp Pleural Space W/img Guide  Result Date: 01/14/2019 INDICATION: BILATERAL pleural effusions, prior LEFT thoracentesis EXAM: ULTRASOUND GUIDED DIAGNOSTIC AND THERAPEUTIC RIGHT THORACENTESIS MEDICATIONS: None. COMPLICATIONS: None immediate. PROCEDURE: An ultrasound guided thoracentesis was thoroughly discussed with the patient and questions answered. The benefits, risks, alternatives and complications were also discussed. The patient understands and wishes to proceed with the procedure. Written consent was obtained. Ultrasound was performed to localize and mark an adequate pocket of fluid in the RIGHT chest. The area was then prepped and draped in the normal sterile fashion. 1% Lidocaine was used for local anesthesia. Under ultrasound guidance a 8 French  thoracentesis catheter was introduced. Thoracentesis was performed. The catheter was removed and a dressing applied. FINDINGS: A total of approximately 1.1 L of yellow RIGHT pleural fluid fluid was removed. Samples were sent to the laboratory as requested by the clinical team. IMPRESSION: Successful ultrasound guided RIGHT thoracentesis yielding 1.1 L of pleural fluid. Electronically Signed   By: Lavonia Dana M.D.   On: 01/14/2019 16:04   Scheduled Meds: . amoxicillin-clavulanate  1 tablet Oral Q12H  . Chlorhexidine Gluconate Cloth  6 each Topical Daily  . gabapentin  100 mg Oral TID  . guaiFENesin  600 mg Oral BID  . ipratropium  0.5 mg Nebulization TID  . levalbuterol  0.63 mg Nebulization TID  . levothyroxine  125 mcg Oral Q0600  . mouth rinse  15 mL Mouth Rinse BID  . pantoprazole  40 mg Oral Daily  . pravastatin  10 mg Oral q1800  . sodium chloride flush  10-40 mL Intracatheter Q12H  . Warfarin - Pharmacist Dosing Inpatient   Does not apply q1800   Continuous Infusions: . amiodarone 30 mg/hr (01/15/19 0536)  . diltiazem (CARDIZEM) infusion 5 mg/hr (01/14/19 2157)    Active Problems:   Polycythemia vera (HCC)   Chronic atrial fibrillation   Anticoagulant long-term use   HTN (hypertension)   Hypothyroidism   PVD (peripheral vascular disease) (HCC)   SOB (shortness of breath)   Pleural effusion on right   Hypoxemia  CHF (congestive heart failure) (HCC)   Acute on chronic respiratory failure with hypoxia (HCC)   Anemia   Chronic respiratory failure with hypoxia (HCC)   Hyperlipidemia   Acute renal failure (ARF) (HCC)   Pressure ulcer   GERD (gastroesophageal reflux disease)   Iron deficiency anemia due to chronic blood loss   Atrial fibrillation with RVR (HCC)   Pneumothorax on left   Status post thoracentesis  Critical CareTime spent:31 minutes   Irwin Brakeman, MD [facility] 01/15/2019, 12:04 PM    LOS: 7 days  How to contact the [facility] Attending or  Consulting provider [place] or covering provider during after hours [place], for this patient?  1. Check the care team in [facility] and look for a) attending/consulting TR[facility] provider listed and b) the [facility] team listed 2. Log into [email or web address] and use [place]'s universal password to access. If you do not have the password, please contact the hospital operator. 3. Locate the [facility] provider you are looking for under [facility] and page to a number that you can be directly reached. 4. If you still have difficulty reaching the provider, please page the [facility] (Director on Call) for the Hospitalists listed on amion for assistance.

## 2019-01-15 NOTE — Progress Notes (Signed)
[facility] Progress Note    Assessment/ Plan:   78 y.o. 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  She was covid-19 negative and was noted to have rapid A fib in the ED.  Heme + stools, INR 3.7, AKI/CKD with Cr bump to 1.76.  CXR revealed pleural effusions and central vascular congestion.  She was diuresed but as BUN/Cr incr lasix was then held.    1. AKI/CKD stage 3 - presumably due to ischemic ATN in setting of acute on chronic combined CHF with IV diuresis, rapid A fib with hypotension, and ABLA.    Agree with holding lasix for now and following UOP and daily Scr.  Pt is not a suitable candidate for HD given her advanced age, multiple co-morbidities, and poor functional status.  She is appropriately a DNR.  Will continue to follow along and cont to renal dose medications.   1. Renal US does not show obstruction, FeNa <1% + pulm congestion c/w cardiorenal syndrome. 2. Will give a dose of Lasix 80mg  IV x1 today. 2. Acute hypoxic respiratory distress- in setting of large bilateral pleural effusions.  S/p left thoracentesis with 1.2 liters removed.  Agree with plan for right thoracentesis.  Covid-19 negative.  Cardiology following for volume and will likely need to restart lasix tomorrow. 3. Atrial fib with RVR- on IV amio and cardizem per cardiology 4. Possible pneumonia- on augmentin 5. Anemia- heme + stools, GI consulted however not a candidate for endoscopic evaluations due to debilitation and multiple co-morbidities.  Follow H/H and transfuse prn.  Was on hydroxyurea per Heme which has been stopped for some time.  6. HTN- low BP   Subjective:   She believes her breathing is a little improved. Denies f/c/n/v.   Objective:   BP 101/60   Pulse (!) 103   Temp 97.7 F (36.5 C)  (Axillary)   Resp 19   Ht 6' (1.829 m)   Wt 69.6 kg   LMP  (LMP Unknown)   SpO2 100%   BMI 20.81 kg/m   Intake/Output Summary (Last 24 hours) at 01/15/2019 1312 Last data filed at 01/14/2019 1745 Gross per 24 hour  Intake 237.96 ml  Output 100 ml  Net 137.96 ml   Weight change: -1.8 kg  Physical Exam: GEN: NAD, slowed mentation, ill appearing HEENT: Conjunctival pallor, EOMI NECK: Supple, no thyromegaly, JVD LUNGS: poor air movement CV: irregularly irregular ABD: SNDNT +BS  EXT: No lower extremity edema    Imaging: US Renal  Result Date: 01/14/2019 CLINICAL DATA:  Acute renal failure, history COPD, hypertension, peripheral vascular disease EXAM: RENAL / URINARY TRACT ULTRASOUND COMPLETE COMPARISON:  CT abdomen and pelvis 03/17/2017 FINDINGS: Right Kidney: Renal measurements: 9.1 x 4.4 x 5.3 cm = volume: 110 mL. Cortical thinning. Mildly increased cortical echogenicity likely present. No mass or hydronephrosis. Left Kidney: Renal measurements: 9.4 x 3.3 x 4.9 cm = volume: 79 mL. Cortical thinning. Increased cortical echogenicity. Mild dilatation of renal pelvis. No calyceal dilatation. No mass or shadowing calcification. Bladder: Contains only a small amount of urine, grossly unremarkable IMPRESSION: Mild LEFT renal pelviectasis without calyceal dilatation. Suspected mild medical renal disease changes of both kidneys. Electronically Signed   By: Lavonia Dana M.D.   On: 01/14/2019 16:11   Dg Chest Port 1  View  Result Date: 01/14/2019 CLINICAL DATA:  BILATERAL pleural effusions, post RIGHT thoracentesis EXAM: PORTABLE CHEST 1 VIEW COMPARISON:  Portable exam 1530 hours compared to 0518 hours FINDINGS: Enlargement of cardiac silhouette with pulmonary vascular congestion. Persistent LEFT pleural effusion and basilar atelectasis. Mild pulmonary edema. Decreased RIGHT pleural effusion and basilar atelectasis post thoracentesis. No pneumothorax. IMPRESSION: No pneumothorax following RIGHT  thoracentesis. Enlargement of cardiac silhouette with pulmonary vascular congestion, pulmonary edema, and persistent LEFT basilar atelectasis/effusion. Electronically Signed   By: Lavonia Dana M.D.   On: 01/14/2019 15:48   Dg Chest Port 1 View  Result Date: 01/14/2019 CLINICAL DATA:  Right-sided pleural effusion EXAM: PORTABLE CHEST 1 VIEW COMPARISON:  01/13/2019 FINDINGS: Cardiac shadow is enlarged but stable. Bilateral pleural effusions are noted left greater than right with underlying atelectasis/infiltrate. No pneumothorax is noted. IMPRESSION: Stable appearance of the chest from the previous day. Electronically Signed   By: Inez Catalina M.D.   On: 01/14/2019 07:33   Dg Chest Port 1 View  Result Date: 01/13/2019 CLINICAL DATA:  Follow-up pneumothorax. EXAM: PORTABLE CHEST 1 VIEW COMPARISON:  01/11/2019 and 01/10/2019 FINDINGS: Patient is moderately rotated to the right. Lungs are adequately inflated and demonstrate interval worsening of moderate bilateral perihilar and bibasilar opacification likely interstitial edema with moderate bilateral effusions left greater than right. There is likely associated atelectasis in the lung bases. No definite left apical pneumothorax visualized. Stable cardiomegaly. Remainder of the exam is unchanged. IMPRESSION: Previously seen tiny left apical pneumothorax not currently visualized. Interval worsening bilateral perihilar and bibasilar opacification likely interstitial edema with moderate size effusions left greater than right. Likely associated basilar atelectasis. Infection in the lung bases is possible. Electronically Signed   By: Marin Olp M.D.   On: 01/13/2019 14:06   US Thoracentesis Asp Pleural Space W/img Guide  Result Date: 01/14/2019 INDICATION: BILATERAL pleural effusions, prior LEFT thoracentesis EXAM: ULTRASOUND GUIDED DIAGNOSTIC AND THERAPEUTIC RIGHT THORACENTESIS MEDICATIONS: None. COMPLICATIONS: None immediate. PROCEDURE: An ultrasound guided  thoracentesis was thoroughly discussed with the patient and questions answered. The benefits, risks, alternatives and complications were also discussed. The patient understands and wishes to proceed with the procedure. Written consent was obtained. Ultrasound was performed to localize and mark an adequate pocket of fluid in the RIGHT chest. The area was then prepped and draped in the normal sterile fashion. 1% Lidocaine was used for local anesthesia. Under ultrasound guidance a 8 French thoracentesis catheter was introduced. Thoracentesis was performed. The catheter was removed and a dressing applied. FINDINGS: A total of approximately 1.1 L of yellow RIGHT pleural fluid fluid was removed. Samples were sent to the laboratory as requested by the clinical team. IMPRESSION: Successful ultrasound guided RIGHT thoracentesis yielding 1.1 L of pleural fluid. Electronically Signed   By: Lavonia Dana M.D.   On: 01/14/2019 16:04    Labs: BMET Recent Labs  Lab 01/23/2019 1426  01/09/19 0331 01/09/19 1933 01/10/19 0515 01/10/19 8338 01/11/19 0555 01/12/19 0415 01/13/19 0436 01/14/19 0452 01/15/19 0425  NA  --    < > 136 136  --  135 136 134* 134* 134* 135  K  --    < > 4.2 3.9  --  3.8 4.3 4.5 4.3 4.3 3.9  CL  --    < > 108 104  --  105 106 102 103 102 101  CO2  --    < > 21* 20*  --  21* 19* 18* 22 22 17*  GLUCOSE  --    < >  109* 97  --  95 74 81 103* 91 94  BUN  --    < > 53* 54*  --  55* 59* 69* 77* 84* 90*  CREATININE  --    < > 1.63* 1.66*  --  1.65* 1.80* 2.02* 2.24* 2.46* 2.64*  CALCIUM  --    < > 8.3* 8.4*  --  8.4* 8.2* 8.1* 8.2* 8.1* 8.2*  PHOS 4.5  --  4.2  --  4.1  --  4.9* 5.7*  --  5.0* 5.2*   < > = values in this interval not displayed.   CBC Recent Labs  Lab 01/09/19 0331 01/10/19 0515 01/11/19 0555 01/12/19 0415 01/13/19 0436 01/14/19 0452 01/15/19 0425  WBC 5.9 5.4 5.6 9.2 12.0* 11.4* 11.2*  NEUTROABS 4.6 4.3 4.8 8.1*  --   --   --   HGB 8.5* 8.5* 8.2* 8.2* 8.0* 8.2* 8.2*   HCT 27.2* 27.9* 26.2* 25.8* 25.1* 25.8* 27.1*  MCV 132.7* 134.1* 130.3* 127.7* 128.7* 127.1* 128.4*  PLT 195 207 252 250 247 286 277    Medications:    . amoxicillin-clavulanate  1 tablet Oral Q12H  . Chlorhexidine Gluconate Cloth  6 each Topical Daily  . furosemide  40 mg Intravenous Once  . gabapentin  100 mg Oral TID  . guaiFENesin  600 mg Oral BID  . ipratropium  0.5 mg Nebulization TID  . levalbuterol  0.63 mg Nebulization TID  . levothyroxine  125 mcg Oral Q0600  . mouth rinse  15 mL Mouth Rinse BID  . pantoprazole  40 mg Oral Daily  . pravastatin  10 mg Oral q1800  . sodium chloride flush  10-40 mL Intracatheter Q12H  . Warfarin - Pharmacist Dosing Inpatient   Does not apply q1800      Otelia Santee, MD 01/15/2019, 1:12 PM

## 2019-01-16 ENCOUNTER — Inpatient Hospital Stay (HOSPITAL_COMMUNITY): Payer: Medicare HMO

## 2019-01-16 LAB — RENAL FUNCTION PANEL
Albumin: 2.5 g/dL — ABNORMAL LOW (ref 3.5–5.0)
Anion gap: 16 — ABNORMAL HIGH (ref 5–15)
BUN: 97 mg/dL — ABNORMAL HIGH (ref 8–23)
CO2: 18 mmol/L — ABNORMAL LOW (ref 22–32)
Calcium: 8 mg/dL — ABNORMAL LOW (ref 8.9–10.3)
Chloride: 98 mmol/L (ref 98–111)
Creatinine, Ser: 3.24 mg/dL — ABNORMAL HIGH (ref 0.44–1.00)
GFR calc Af Amer: 15 mL/min — ABNORMAL LOW (ref >60)
GFR calc non Af Amer: 13 mL/min — ABNORMAL LOW (ref >60)
Glucose, Bld: 94 mg/dL (ref 70–99)
Phosphorus: 5.7 mg/dL — ABNORMAL HIGH (ref 2.5–4.6)
Potassium: 4 mmol/L (ref 3.5–5.1)
Sodium: 132 mmol/L — ABNORMAL LOW (ref 135–145)

## 2019-01-16 LAB — PROTIME-INR
INR: 3.7 — ABNORMAL HIGH (ref 0.8–1.2)
Prothrombin Time: 36 seconds — ABNORMAL HIGH (ref 11.4–15.2)

## 2019-01-16 LAB — GLUCOSE, CAPILLARY: Glucose-Capillary: 104 mg/dL — ABNORMAL HIGH (ref 70–99)

## 2019-01-16 LAB — CBC
HCT: 26.3 % — ABNORMAL LOW (ref 36.0–46.0)
Hemoglobin: 8.3 g/dL — ABNORMAL LOW (ref 12.0–15.0)
MCH: 39.9 pg — ABNORMAL HIGH (ref 26.0–34.0)
MCHC: 31.6 g/dL (ref 30.0–36.0)
MCV: 126.4 fL — ABNORMAL HIGH (ref 80.0–100.0)
Platelets: 258 10*3/uL (ref 150–400)
RBC: 2.08 MIL/uL — ABNORMAL LOW (ref 3.87–5.11)
RDW: 21.2 % — ABNORMAL HIGH (ref 11.5–15.5)
WBC: 10.3 10*3/uL (ref 4.0–10.5)
nRBC: 0 % (ref 0.0–0.2)

## 2019-01-16 LAB — MAGNESIUM: Magnesium: 2.5 mg/dL — ABNORMAL HIGH (ref 1.7–2.4)

## 2019-01-16 MED ORDER — POLYVINYL ALCOHOL 1.4 % OP SOLN: 1.0000 [drp] | Freq: Four times a day (QID) | OPHTHALMIC | Status: DC | PRN

## 2019-01-16 MED ORDER — LEVALBUTEROL HCL 0.63 MG/3ML IN NEBU: 0.6300 mg | INHALATION_SOLUTION | Freq: Three times a day (TID) | RESPIRATORY_TRACT | Status: DC | PRN

## 2019-01-16 MED ORDER — LORAZEPAM 2 MG/ML IJ SOLN: 1.0000 mg | INTRAMUSCULAR | Status: DC | PRN

## 2019-01-16 MED ORDER — LORAZEPAM 1 MG PO TABS: 1.0000 mg | ORAL_TABLET | ORAL | Status: DC | PRN

## 2019-01-16 MED ORDER — MORPHINE SULFATE (PF) 2 MG/ML IV SOLN
1.0000 mg | INTRAVENOUS | Status: DC | PRN
  Administered 2019-01-16 – 2019-01-17 (×5): 1 mg via INTRAVENOUS
  Filled 2019-01-16 (×5): qty 1

## 2019-01-16 MED ORDER — MORPHINE SULFATE (CONCENTRATE) 10 MG/0.5ML PO SOLN: 5.0000 mg | ORAL | Status: DC | PRN

## 2019-01-16 MED ORDER — MORPHINE SULFATE (CONCENTRATE) 10 MG/0.5ML PO SOLN
5.0000 mg | ORAL | Status: DC | PRN
  Administered 2019-01-17: 5 mg via SUBLINGUAL
  Filled 2019-01-16: qty 0.5

## 2019-01-16 MED ORDER — IPRATROPIUM BROMIDE 0.02 % IN SOLN: 0.5000 mg | Freq: Three times a day (TID) | RESPIRATORY_TRACT | Status: DC | PRN

## 2019-01-16 MED ORDER — LORAZEPAM 2 MG/ML PO CONC: 1.0000 mg | ORAL | Status: DC | PRN

## 2019-01-16 MED ORDER — GLYCOPYRROLATE 0.2 MG/ML IJ SOLN
0.2000 mg | INTRAMUSCULAR | Status: DC | PRN
  Administered 2019-01-16: 0.2 mg via INTRAVENOUS
  Filled 2019-01-16: qty 1

## 2019-01-16 MED ORDER — GLYCOPYRROLATE 0.2 MG/ML IJ SOLN: 0.2000 mg | INTRAMUSCULAR | Status: DC | PRN

## 2019-01-16 MED ORDER — BIOTENE DRY MOUTH MT LIQD: 15.0000 mL | OROMUCOSAL | Status: DC | PRN

## 2019-01-16 MED ORDER — GLYCOPYRROLATE 1 MG PO TABS: 1.0000 mg | ORAL_TABLET | ORAL | Status: DC | PRN

## 2019-01-16 MED ORDER — DIPHENHYDRAMINE HCL 50 MG/ML IJ SOLN: 12.5000 mg | INTRAMUSCULAR | Status: DC | PRN

## 2019-01-16 NOTE — Progress Notes (Signed)
ANTICOAGULATION CONSULT NOTE -   Pharmacy Consult for warfarin Indication: atrial fibrillation  No Known Allergies  Patient Measurements: Height: 6' (182.9 cm) Weight: 153 lb 7 oz (69.6 kg) IBW/kg (Calculated) : 73.1   Vital Signs: Temp: 98.5 F (36.9 C) (06/14 0400) Temp Source: Axillary (06/14 0400) BP: 84/52 (06/14 0630) Pulse Rate: 136 (06/14 0630)  Labs: Recent Labs    01/14/19 0452 01/15/19 0425 01/16/19 0432  HGB 8.2* 8.2* 8.3*  HCT 25.8* 27.1* 26.3*  PLT 286 277 258  LABPROT 21.1* 26.9* 36.0*  INR 1.9* 2.5* 3.7*  CREATININE 2.46* 2.64* 3.24*    Estimated Creatinine Clearance: 16.2 mL/min (A) (by C-G formula based on SCr of 3.24 mg/dL (H)).  Assessment: Pharmacy consulted to dose warfarin for this 78 yo female with atrial fibrillation on chronic anti-coagulation with warfarin.  Her home dose of warfarin is 2.5mg  on Sun/Tues/Thurs  and 5mg  on Mon/Wed/Fri/Sat.    INR 1.9- Vitamin K given 6/10. Warfarin dose 6/11 not given for unknown reason.  Patient on amiodarone- can increase INR   6/13 INR 2.5, 4mg  given last night  6/14 INR 3.7 2mg  give last night   Goal of Therapy:  INR 2-3 Monitor platelets by anticoagulation protocol: Yes   Plan:  Hold warfarin tonight  Monitor daily INR and s/s of bleeding.  Thomasenia Sales, PharmD, MBA, BCGP Clinical Pharmacist  01/16/2019 7:42 AM

## 2019-01-16 NOTE — Progress Notes (Signed)
[facility] Progress Note    Assessment/ Plan:   78 y.o.female PMH chronic afib on coumadin, combined CHF, COPD O2 dependent, HTN, PCV (had been on hydroxyurea but on hold due to development of anemia), PVD s/p toe amputation, and CKD stage 3 who presented to [facility] with worsening SOB and weakness. She was covid-19 negative and was noted to have rapid A fib in the ED. Heme + stools, INR 3.7, AKI/CKD with Cr bump to 1.76. CXR revealed pleural effusions and central vascular congestion.  She was diuresed but as BUN/Cr incr lasix was then held.   1. AKI/CKD stage 3 - presumably due to ischemic ATN in setting of acute on chronic combined CHF with IV diuresis, rapid A fib with hypotension, and ABLA. Agree with holding lasix for now and following UOP and daily Scr. Pt is not a suitable candidate for HD given her advanced age, multiple co-morbidities, and poor functional status. She is appropriately a DNR. Will continue to follow along and cont to renal dose medications.  1. Renal US does not show obstruction, FeNa <1% + pulm congestion c/w cardiorenal syndrome. 2. Gave a dose of Lasix 80mg  IV x1 6/13 -> poor response. 3. Cardiorenal syndrome and may be moving into ATN, not responding well to diuretics. 4. Other possibility is with the reaccumulating left sided pleural effusion she may be intravascularly dry but I am very hesitant to give fluids even at a slow rate with the pulm congestion.  5. Will also check a bladder scan for for PVR but obstructive uropathy lower on differential. 6. Would consider palliative. 2. Acute hypoxic respiratory distress- in setting of large bilateral pleural effusions. S/p left thoracentesis with 1.2 liters removed and rt with 1.1L. Covid-19 negative.  Left side effusion is reaccumulating. 3. Atrial fib with RVR- on IV amio and cardizem per cardiology 4. Possible pneumonia- on augmentin 5. Anemia- heme + stools, GI consulted however not a candidate  for endoscopic evaluations due to debilitation and multiple co-morbidities. Follow H/H and transfuse prn. Was on hydroxyurea per Heme which has been stopped for some time.  6. HTN- low BP  Subjective:   She believes her breathing is a little improved. Denies f/c/n/v. No events overnight   Objective:   BP (!) 88/53 (BP Location: Right Arm)   Pulse 92   Temp 98.3 F (36.8 C) (Axillary)   Resp (!) 26   Ht 6' (1.829 m)   Wt 69.6 kg   LMP  (LMP Unknown)   SpO2 95%   BMI 20.81 kg/m   Intake/Output Summary (Last 24 hours) at 01/16/2019 1610 Last data filed at 01/16/2019 0355 Gross per 24 hour  Intake 440 ml  Output 150 ml  Net 290 ml   Weight change:   Physical Exam: GEN: NAD, slowed mentation, ill appearing HEENT: Conjunctival pallor, EOMI NECK: Supple, no thyromegaly, JVD LUNGS: poor air movement in the left side CV: irregularly irregular ABD: SNDNT +BS  EXT: Dependent lower extremity edema in the thighs  Imaging: US Renal  Result Date: 01/14/2019 CLINICAL DATA:  Acute renal failure, history COPD, hypertension, peripheral vascular disease EXAM: RENAL / URINARY TRACT ULTRASOUND COMPLETE COMPARISON:  CT abdomen and pelvis 03/17/2017 FINDINGS: Right Kidney: Renal measurements: 9.1 x 4.4 x 5.3 cm = volume: 110 mL. Cortical thinning. Mildly increased cortical echogenicity likely present. No mass or hydronephrosis. Left Kidney: Renal measurements: 9.4 x 3.3 x 4.9 cm = volume: 79 mL. Cortical thinning. Increased cortical echogenicity. Mild dilatation of renal pelvis.  No calyceal dilatation. No mass or shadowing calcification. Bladder: Contains only a small amount of urine, grossly unremarkable IMPRESSION: Mild LEFT renal pelviectasis without calyceal dilatation. Suspected mild medical renal disease changes of both kidneys. Electronically Signed   By: Lavonia Dana M.D.   On: 01/14/2019 16:11   Dg Chest Port 1 View  Result Date: 01/14/2019 CLINICAL DATA:  BILATERAL pleural effusions,  post RIGHT thoracentesis EXAM: PORTABLE CHEST 1 VIEW COMPARISON:  Portable exam 1530 hours compared to 0518 hours FINDINGS: Enlargement of cardiac silhouette with pulmonary vascular congestion. Persistent LEFT pleural effusion and basilar atelectasis. Mild pulmonary edema. Decreased RIGHT pleural effusion and basilar atelectasis post thoracentesis. No pneumothorax. IMPRESSION: No pneumothorax following RIGHT thoracentesis. Enlargement of cardiac silhouette with pulmonary vascular congestion, pulmonary edema, and persistent LEFT basilar atelectasis/effusion. Electronically Signed   By: Lavonia Dana M.D.   On: 01/14/2019 15:48   US Thoracentesis Asp Pleural Space W/img Guide  Result Date: 01/14/2019 INDICATION: BILATERAL pleural effusions, prior LEFT thoracentesis EXAM: ULTRASOUND GUIDED DIAGNOSTIC AND THERAPEUTIC RIGHT THORACENTESIS MEDICATIONS: None. COMPLICATIONS: None immediate. PROCEDURE: An ultrasound guided thoracentesis was thoroughly discussed with the patient and questions answered. The benefits, risks, alternatives and complications were also discussed. The patient understands and wishes to proceed with the procedure. Written consent was obtained. Ultrasound was performed to localize and mark an adequate pocket of fluid in the RIGHT chest. The area was then prepped and draped in the normal sterile fashion. 1% Lidocaine was used for local anesthesia. Under ultrasound guidance a 8 French thoracentesis catheter was introduced. Thoracentesis was performed. The catheter was removed and a dressing applied. FINDINGS: A total of approximately 1.1 L of yellow RIGHT pleural fluid fluid was removed. Samples were sent to the laboratory as requested by the clinical team. IMPRESSION: Successful ultrasound guided RIGHT thoracentesis yielding 1.1 L of pleural fluid. Electronically Signed   By: Lavonia Dana M.D.   On: 01/14/2019 16:04    Labs: BMET Recent Labs  Lab 01/09/19 1933 01/10/19 0515 01/10/19 9892  01/11/19 0555 01/12/19 0415 01/13/19 0436 01/14/19 0452 01/15/19 0425  NA 136  --  135 136 134* 134* 134* 135  K 3.9  --  3.8 4.3 4.5 4.3 4.3 3.9  CL 104  --  105 106 102 103 102 101  CO2 20*  --  21* 19* 18* 22 22 17*  GLUCOSE 97  --  95 74 81 103* 91 94  BUN 54*  --  55* 59* 69* 77* 84* 90*  CREATININE 1.66*  --  1.65* 1.80* 2.02* 2.24* 2.46* 2.64*  CALCIUM 8.4*  --  8.4* 8.2* 8.1* 8.2* 8.1* 8.2*  PHOS  --  4.1  --  4.9* 5.7*  --  5.0* 5.2*   CBC Recent Labs  Lab 01/10/19 0515 01/11/19 0555 01/12/19 0415 01/13/19 0436 01/14/19 0452 01/15/19 0425  WBC 5.4 5.6 9.2 12.0* 11.4* 11.2*  NEUTROABS 4.3 4.8 8.1*  --   --   --   HGB 8.5* 8.2* 8.2* 8.0* 8.2* 8.2*  HCT 27.9* 26.2* 25.8* 25.1* 25.8* 27.1*  MCV 134.1* 130.3* 127.7* 128.7* 127.1* 128.4*  PLT 207 252 250 247 286 277    Medications:    . amoxicillin-clavulanate  1 tablet Oral Q12H  . Chlorhexidine Gluconate Cloth  6 each Topical Daily  . gabapentin  100 mg Oral TID  . guaiFENesin  600 mg Oral BID  . ipratropium  0.5 mg Nebulization TID  . levalbuterol  0.63 mg Nebulization TID  . levothyroxine  125 mcg Oral Q0600  . mouth rinse  15 mL Mouth Rinse BID  . pantoprazole  40 mg Oral Daily  . pravastatin  10 mg Oral q1800  . sodium chloride flush  10-40 mL Intracatheter Q12H  . Warfarin - Pharmacist Dosing Inpatient   Does not apply q1800      Otelia Santee, MD 01/16/2019, 6:07 AM

## 2019-01-16 NOTE — Progress Notes (Signed)
PROGRESS NOTE  Charlotte [name]  QJF:[id number]  DOB: 1941/05/05  DOA: 01/28/2019 PCP: [name], MD  Brief Admission Hx: 78 year old female with chronic atrial fibrillation fully anticoagulated with warfarin, combined CHF, COPD, oxygen dependent, hypertension, hypothyroidism and hyperlipidemia, deafness, polycythemia vera and other multiple medical comorbidities presented with severe shortness of breath, uncontrolled heart rate A. fib RVR.  MDM/Assessment & Plan:   1. Large bilateral pleural effusions with significant atelectasis- patient is status post left thoracentesis with 1200 mL of fluid removed.  Dr Thornton Papas says that he cannot tap right side if pneumothorax on left is not resolved.   Pt is s/p right thoracentesis 6/12 where 1.1 L fluid was removed and xray shows no  Pneumothorax.  Pt says her breathing is a little better but still requiring HFNC. She is not diuresing.  Her renal function is worsening.      2. Acute on chronic respiratory failure with hypoxia -patient was volume overloaded and is status post thoracentesis with some improvement in symptoms however.  She remains in the stepdown unit.  CT of the chest revealed congestive heart failure and large pleural effusions.  SARS-CoV-2 test is negative.  Pt having no meaningful improvement after maximal medical therapies.  I spoke with husband and he is agreeable to transition to comfort care and withdraw full scope treatments.  3. Pneumonia - xray suggesting pneumonia vs atelectasis - with bump in WBC will treat with augmentin 875 mg BID x 3 days.  4. Atrial fibrillation with RVR-pt is treated with IV amiodarone and Cardizem per cardiology service.  Appreciate the assistance of the heart care team.  Pt is hypotensive now.  Plan is to withdraw full scope treatments and transition to full comfort care. DC IV infusions.  5. Small apical pneumothorax- status post thoracentesis.  Repeat imaging shows resolution.     6. Hypothyroidism  -Continue home levothyroxine. 7. AKI on CKD stage 4 - Pt having some worsening CKD, appreciate nephrology consult.  Pt's renal function is not improving and patient is not a candidate for diaysis.  Discussed with husband and he is agreeable to full comfort care.    8. Hyperlipidemia-treated with pravastatin.  9. Acute combined CHF exacerbation-Pt remains fluid overloaded and kidneys not responsive to IV lasix.   Appreciate the assistance of the cardiology and nephrology services. 10. Polycythemia vera - pt has been seen by hematologist Dr. Delton Coombes.  11. Hyperbilirubinemia.  12. GERD - continue protonix daily.   13. PVD - She is followed by Dr. [name] with vascular surgery and recently s/p amputation of toe.   DVT prophylaxis: warfarin Code Status: DNR Family Communication: I again had long discussion with husband, patient is not having any meaningful improvement and starting to express not wanting to be stuck for blood draws, etc.  He feels that it is time to withdraw full scope treatment and to focus on comfort care and dignity. He wants to come and visit which we will allow.  I think that patient is a candidate for residential hospice but will have palliative team discuss further with family tomorrow.     Disposition Plan: hospice   Consultants:  Cardiology  Hematology  Radiology  GI   Procedures:  Echocardiogram  ECHOCARDIOGRAM on 01/09/2019 IMPRESSIONS  1. The left ventricle has normal systolic function with an ejection fraction of 60-65%. The cavity size was normal. Left ventricular diastolic function could not be evaluated secondary to atrial fibrillation. 2. The right ventricle has severely reduced systolic function. The cavity  was moderately enlarged. There is no increase in right ventricular wall thickness. Right ventricular systolic pressure is moderately elevated with an estimated pressure of 49.1  mmHg. 3. Left atrial size was severely dilated. 4. Right atrial size  was severely dilated. 5. Large pleural effusion. 6. Trivial pericardial effusion is present. 7. No evidence of mitral valve stenosis. 8. No stenosis of the aortic valve. 9. The aortic root and ascending aorta are normal in size and structure. 10. The inferior vena cava was normal in size with <50% respiratory variability. 11. The interatrial septum was not assessed.  FINDINGS Left Ventricle: The left ventricle has normal systolic function, with an ejection fraction of 60-65%. The cavity size was normal. There is no increase in left ventricular wall thickness. Left ventricular diastolic function could not be evaluated  secondary to atrial fibrillation.  Right Ventricle: The right ventricle has severely reduced systolic function. The cavity was moderately enlarged. There is no increase in right ventricular wall thickness. Right ventricular systolic pressure is moderately elevated with an estimated  pressure of 49.1 mmHg. Pacing wire/catheter visualized in the right ventricle.  Left Atrium: Left atrial size was severely dilated.  Right Atrium: Right atrial size was severely dilated. Right atrial pressure is estimated at 3 mmHg.  Interatrial Septum: The interatrial septum was not assessed.  Pericardium: Trivial pericardial effusion is present. There is a large pleural effusion.  Mitral Valve: The mitral valve is normal in structure. Mitral valve regurgitation is trivial by color flow Doppler. No evidence of mitral valve stenosis.  Tricuspid Valve: The tricuspid valve is normal in structure. Tricuspid valve regurgitation is mild by color flow Doppler.  Aortic Valve: The aortic valve is normal in structure. Aortic valve regurgitation was not visualized by color flow Doppler. There is No stenosis of the aortic valve.  Pulmonic Valve: The pulmonic valve was grossly normal. Pulmonic valve regurgitation is trivial by color flow Doppler.  Aorta: The aortic root and ascending  aorta are normal in size and structure.  Venous: The inferior vena cava is normal in size with less than 50% respiratory variability.  Antimicrobials:    Subjective: Patient says that she needs tired of all the poking and prodding.  She says that her breathing is better today.  She says she has no appetite today.  The nurse reports that she drank a Coca-Cola yesterday but did not eat much.  She denies shortness of breath and chest pain.  She remains on high flow nasal cannula.  Objective: Vitals:   01/16/19 0800 01/16/19 0900 01/16/19 1000 01/16/19 1100  BP: (!) 78/49 (!) 90/53 (!) 87/57 (!) 86/55  Pulse: 96 80 82 88  Resp: 18 (!) 21 18 (!) 22  Temp:      TempSrc:      SpO2: 94% 95% 97% 95%  Weight:      Height:        Intake/Output Summary (Last 24 hours) at 01/16/2019 1120 Last data filed at 01/16/2019 0600 Gross per 24 hour  Intake 440 ml  Output 250 ml  Net 190 ml   Filed Weights   01/14/19 0500 01/15/19 0500 01/16/19 0500  Weight: 71.4 kg 69.6 kg 69.6 kg   REVIEW OF SYSTEMS  As per history otherwise all reviewed and reported negative  Exam:  General exam: Chronically ill-appearing female she lethargic but rousable.  She is very hard of hearing.  She is cooperative and pleasant. NAD.  Respiratory system:  No increased work of breathing. Cardiovascular system: S1 &  S2 heard.  Irregularly irregular.  No JVD, murmurs, gallops, clicks or pedal edema. Gastrointestinal system: Abdomen is nondistended, soft and nontender. Normal bowel sounds heard. Central nervous system: Alert and oriented. No focal neurological deficits. Extremities: 1+ pretibial edema bilateral lower extremities.  Bilateral heel protection.  Data Reviewed: Basic Metabolic Panel: Recent Labs  Lab 01/10/19 0515  01/11/19 0555 01/12/19 0415 01/13/19 0436 01/14/19 0452 01/15/19 0425 01/16/19 0432  NA  --    < > 136 134* 134* 134* 135 132*  K  --    < > 4.3 4.5 4.3 4.3 3.9 4.0  CL  --    < > 106  102 103 102 101 98  CO2  --    < > 19* 18* 22 22 17* 18*  GLUCOSE  --    < > 74 81 103* 91 94 94  BUN  --    < > 59* 69* 77* 84* 90* 97*  CREATININE  --    < > 1.80* 2.02* 2.24* 2.46* 2.64* 3.24*  CALCIUM  --    < > 8.2* 8.1* 8.2* 8.1* 8.2* 8.0*  MG 2.1  --  2.2 2.2  --  2.4  --  2.5*  PHOS 4.1  --  4.9* 5.7*  --  5.0* 5.2* 5.7*   < > = values in this interval not displayed.   Liver Function Tests: Recent Labs  Lab 01/09/19 1933 01/10/19 0839 01/11/19 0555 01/12/19 0415 01/14/19 0452 01/15/19 0425 01/16/19 0432  AST 13* 12* 11* 11*  --   --   --   ALT 9 7 8 8   --   --   --   ALKPHOS 64 60 54 58  --   --   --   BILITOT 1.4* 1.5* 1.5* 1.6*  --   --   --   PROT 6.3* 5.8* 5.6* 5.7*  --   --   --   ALBUMIN 3.4* 3.1* 2.9* 2.8* 2.7* 2.6* 2.5*   No results for input(s): LIPASE, AMYLASE in the last 168 hours. No results for input(s): AMMONIA in the last 168 hours. CBC: Recent Labs  Lab 01/10/19 0515 01/11/19 0555 01/12/19 0415 01/13/19 0436 01/14/19 0452 01/15/19 0425 01/16/19 0432  WBC 5.4 5.6 9.2 12.0* 11.4* 11.2* 10.3  NEUTROABS 4.3 4.8 8.1*  --   --   --   --   HGB 8.5* 8.2* 8.2* 8.0* 8.2* 8.2* 8.3*  HCT 27.9* 26.2* 25.8* 25.1* 25.8* 27.1* 26.3*  MCV 134.1* 130.3* 127.7* 128.7* 127.1* 128.4* 126.4*  PLT 207 252 250 247 286 277 258   Cardiac Enzymes: No results for input(s): CKTOTAL, CKMB, CKMBINDEX, TROPONINI in the last 168 hours. CBG (last 3)  Recent Labs    01/15/19 0749 01/15/19 1140 01/16/19 0750  GLUCAP 99 110* 104*   Recent Results (from the past 240 hour(s))  Blood culture (routine x 2)     Status: None   Collection Time: 01/04/2019 12:00 PM   Specimen: BLOOD RIGHT ARM  Result Value Ref Range Status   Specimen Description BLOOD RIGHT ARM  Final   Special Requests   Final    Blood Culture adequate volume BOTTLES DRAWN AEROBIC AND ANAEROBIC   Culture   Final    NO GROWTH 5 DAYS Performed at [facility], [address], [place], [place] [address]     Report Status 01/13/2019 FINAL  Final  SARS Coronavirus 2 (CEPHEID - Performed in [place] hospital lab), [facility]  Status: None   Collection Time: 01/26/2019 12:17 PM   Specimen: Nasopharyngeal Swab  Result Value Ref Range Status   SARS Coronavirus 2 NEGATIVE NEGATIVE Final    Comment: (NOTE) If result is NEGATIVE SARS-CoV-2 target nucleic acids are NOT DETECTED. The SARS-CoV-2 RNA is generally detectable in upper and lower  respiratory specimens during the acute phase of infection. The lowest  concentration of SARS-CoV-2 viral copies this assay can detect is 250  copies / mL. A negative result does not preclude SARS-CoV-2 infection  and should not be used as the sole basis for treatment or other  patient management decisions.  A negative result may occur with  improper specimen collection / handling, submission of specimen other  than nasopharyngeal swab, presence of viral mutation(s) within the  areas targeted by this assay, and inadequate number of viral copies  (<250 copies / mL). A negative result must be combined with clinical  observations, patient history, and epidemiological information. If result is POSITIVE SARS-CoV-2 target nucleic acids are DETECTED. The SARS-CoV-2 RNA is generally detectable in upper and lower  respiratory specimens dur ing the acute phase of infection.  Positive  results are indicative of active infection with SARS-CoV-2.  Clinical  correlation with patient history and other diagnostic information is  necessary to determine patient infection status.  Positive results do  not rule out bacterial infection or co-infection with other viruses. If result is PRESUMPTIVE POSTIVE SARS-CoV-2 nucleic acids MAY BE PRESENT.   A presumptive positive result was obtained on the submitted specimen  and confirmed on repeat testing.  While 2019 novel coronavirus  (SARS-CoV-2) nucleic acids may be present in the submitted sample  additional confirmatory testing  may be necessary for epidemiological  and / or clinical management purposes  to differentiate between  SARS-CoV-2 and other Sarbecovirus currently known to infect humans.  If clinically indicated additional testing with an alternate test  methodology [phone]) is advised. The SARS-CoV-2 RNA is generally  detectable in upper and lower respiratory sp ecimens during the acute  phase of infection. The expected result is Negative. Fact Sheet for Patients:  StrictlyIdeas.no Fact Sheet for Healthcare Providers: BankingDealers.co.za This test is not yet approved or cleared by the [place] FDA and has been authorized for detection and/or diagnosis of SARS-CoV-2 by FDA under an Emergency Use Authorization (EUA).  This EUA will remain in effect (meaning this test can be used) for the duration of the COVID-19 declaration under Section 564(b)(1) of the Act, 21 U.S.C. section 360bbb-3(b)(1), unless the authorization is terminated or revoked sooner. Performed at [facility], [address] [place], [place] [address]   Blood culture (routine x 2)     Status: None   Collection Time: 01/14/2019 12:46 PM   Specimen: Left Antecubital; Blood  Result Value Ref Range Status   Specimen Description LEFT ANTECUBITAL  Final   Special Requests   Final    BOTTLES DRAWN AEROBIC AND ANAEROBIC Blood Culture adequate volume   Culture   Final    NO GROWTH 5 DAYS Performed at [facility], [address] [place], [place] [address]    Report Status 01/13/2019 FINAL  Final  MRSA PCR Screening     Status: None   Collection Time: 02/01/2019  7:57 PM   Specimen: Nasal Mucosa; Nasopharyngeal  Result Value Ref Range Status   MRSA by PCR NEGATIVE NEGATIVE Final    Comment:        The GeneXpert MRSA Assay (FDA approved for NASAL specimens  only), is one component of a comprehensive MRSA colonization surveillance program. It is not intended to diagnose MRSA infection  nor to guide or monitor treatment for MRSA infections. Performed at [facility], [address], [place], [place] [address]   Fungus Culture With Stain     Status: None (Preliminary result)   Collection Time: 01/10/19 12:42 PM  Result Value Ref Range Status   Fungus Stain Final report  Final    Comment: (NOTE) Performed At: [facility] [address] [place], [place] [id number] Rush Farmer MD VQ:[id number]    Fungus (Mycology) Culture PENDING  Incomplete   Fungal Source PLEURAL  Final    Comment: Performed at [facility], [address], [place], [place] [address]  Fungus Culture Result     Status: None   Collection Time: 01/10/19 12:42 PM  Result Value Ref Range Status   Result 1 Comment  Final    Comment: (NOTE) KOH/Calcofluor preparation:  no fungus observed. Performed At: [facility] [place], [place] [id number] Rush Farmer MD PJ:[id number]   Gram stain     Status: None   Collection Time: 01/10/19 12:43 PM   Specimen: Pleura; Body Fluid  Result Value Ref Range Status   Specimen Description PLEURAL  Final   Special Requests NONE  Final   Gram Stain   Final    CYTOSPIN SMEAR NO ORGANISMS SEEN WBC PRESENT,BOTH PMN AND MONONUCLEAR Performed at [facility], [address], [place], [place] [address]    Report Status 01/10/2019 FINAL  Final  Culture, body fluid-bottle     Status: None (Preliminary result)   Collection Time: 01/14/19  2:38 PM   Specimen: Pleura  Result Value Ref Range Status   Specimen Description PLEURAL  Final   Special Requests BOTTLES DRAWN AEROBIC AND ANAEROBIC 10 CC EACH  Final   Culture   Final    NO GROWTH < 24 HOURS Performed at [facility], [address] [place], [name] [address]    Report Status PENDING  Incomplete  Gram stain     Status: None   Collection Time: 01/14/19  2:38 PM   Specimen: Pleural, Right; Pleural Fluid  Result Value Ref Range Status   Specimen Description PLEURAL  Final    Special Requests NONE  Final   Gram Stain   Final    CYTOSPIN SMEAR NO ORGANISMS SEEN WBC PRESENT,BOTH PMN AND MONONUCLEAR Performed at [facility], [address], [place],  [address]    Report Status 01/14/2019 FINAL  Final     Studies: US Renal  Result Date: 01/14/2019 CLINICAL DATA:  Acute renal failure, history COPD, hypertension, peripheral vascular disease EXAM: RENAL / URINARY TRACT ULTRASOUND COMPLETE COMPARISON:  CT abdomen and pelvis 03/17/2017 FINDINGS: Right Kidney: Renal measurements: 9.1 x 4.4 x 5.3 cm = volume: 110 mL. Cortical thinning. Mildly increased cortical echogenicity likely present. No mass or hydronephrosis. Left Kidney: Renal measurements: 9.4 x 3.3 x 4.9 cm = volume: 79 mL. Cortical thinning. Increased cortical echogenicity. Mild dilatation of renal pelvis. No calyceal dilatation. No mass or shadowing calcification. Bladder: Contains only a small amount of urine, grossly unremarkable IMPRESSION: Mild LEFT renal pelviectasis without calyceal dilatation. Suspected mild medical renal disease changes of both kidneys. Electronically Signed   By: Lavonia Dana M.D.   On: 01/14/2019 16:11   Dg Chest Port 1 View  Result Date: 01/16/2019 CLINICAL DATA:  Pulmonary edema. Atrial fibrillation. COPD. EXAM: PORTABLE CHEST 1 VIEW COMPARISON:  01/14/2019 FINDINGS: Stable  cardiomegaly and diffuse interstitial infiltrates likely due to interstitial edema. Stable large left pleural effusion and small right pleural effusion. No significant change compared to prior study. IMPRESSION: No significant change compared to prior exam. Electronically Signed   By: Earle Gell M.D.   On: 01/16/2019 08:10   Dg Chest Port 1 View  Result Date: 01/14/2019 CLINICAL DATA:  BILATERAL pleural effusions, post RIGHT thoracentesis EXAM: PORTABLE CHEST 1 VIEW COMPARISON:  Portable exam 1530 hours compared to 0518 hours FINDINGS: Enlargement of cardiac silhouette with pulmonary vascular congestion.  Persistent LEFT pleural effusion and basilar atelectasis. Mild pulmonary edema. Decreased RIGHT pleural effusion and basilar atelectasis post thoracentesis. No pneumothorax. IMPRESSION: No pneumothorax following RIGHT thoracentesis. Enlargement of cardiac silhouette with pulmonary vascular congestion, pulmonary edema, and persistent LEFT basilar atelectasis/effusion. Electronically Signed   By: Lavonia Dana M.D.   On: 01/14/2019 15:48   US Thoracentesis Asp Pleural Space W/img Guide  Result Date: 01/14/2019 INDICATION: BILATERAL pleural effusions, prior LEFT thoracentesis EXAM: ULTRASOUND GUIDED DIAGNOSTIC AND THERAPEUTIC RIGHT THORACENTESIS MEDICATIONS: None. COMPLICATIONS: None immediate. PROCEDURE: An ultrasound guided thoracentesis was thoroughly discussed with the patient and questions answered. The benefits, risks, alternatives and complications were also discussed. The patient understands and wishes to proceed with the procedure. Written consent was obtained. Ultrasound was performed to localize and mark an adequate pocket of fluid in the RIGHT chest. The area was then prepped and draped in the normal sterile fashion. 1% Lidocaine was used for local anesthesia. Under ultrasound guidance a 8 French thoracentesis catheter was introduced. Thoracentesis was performed. The catheter was removed and a dressing applied. FINDINGS: A total of approximately 1.1 L of yellow RIGHT pleural fluid fluid was removed. Samples were sent to the laboratory as requested by the clinical team. IMPRESSION: Successful ultrasound guided RIGHT thoracentesis yielding 1.1 L of pleural fluid. Electronically Signed   By: Lavonia Dana M.D.   On: 01/14/2019 16:04   Scheduled Meds:  amoxicillin-clavulanate  1 tablet Oral Q12H   Chlorhexidine Gluconate Cloth  6 each Topical Daily   gabapentin  100 mg Oral TID   guaiFENesin  600 mg Oral BID   ipratropium  0.5 mg Nebulization TID   levalbuterol  0.63 mg Nebulization TID    levothyroxine  125 mcg Oral Q0600   mouth rinse  15 mL Mouth Rinse BID   pantoprazole  40 mg Oral Daily   pravastatin  10 mg Oral q1800   sodium chloride flush  10-40 mL Intracatheter Q12H   Warfarin - Pharmacist Dosing Inpatient   Does not apply q1800   Continuous Infusions:  amiodarone 30 mg/hr (01/16/19 0515)   diltiazem (CARDIZEM) infusion 2 mg/hr (01/16/19 1022)    Active Problems:   Polycythemia vera (HCC)   Chronic atrial fibrillation   Anticoagulant long-term use   HTN (hypertension)   Hypothyroidism   PVD (peripheral vascular disease) (HCC)   SOB (shortness of breath)   Pleural effusion on right   Hypoxemia   CHF (congestive heart failure) (HCC)   Acute on chronic respiratory failure with hypoxia (HCC)   Anemia   Chronic respiratory failure with hypoxia (HCC)   Hyperlipidemia   Acute renal failure (ARF) (HCC)   Pressure ulcer   GERD (gastroesophageal reflux disease)   Iron deficiency anemia due to chronic blood loss   Atrial fibrillation with RVR ([place])   Pneumothorax on left   Status post thoracentesis  Critical CareTime spent:35 minutes   Irwin Brakeman, MD [facility] 01/16/2019, 11:20 AM  LOS: 8 days  How to contact the [facility] Attending or Consulting provider [place] or covering provider during after hours [place], for this patient?  1. Check the care team in [facility] and look for a) attending/consulting [facility] provider listed and b) the [facility] team listed 2. Log into [email or web address] and use [place]'s universal password to access. If you do not have the password, please contact the hospital operator. 3. Locate the [facility] provider you are looking for under [facility] and page to a number that you can be directly reached. 4. If you still have difficulty reaching the provider, please page the [facility] (Director on Call) for the Hospitalists listed on amion for assistance.

## 2019-01-16 NOTE — Progress Notes (Signed)
Dr. Olevia Bowens paged d/t bladder scan showing 464mL urine in bladder. Pt only put out 146mL over night with purewick. Dr. Augustin Coupe with nephrology made aware as well. Waiting for orders/call back.Will continue to monitor pt

## 2019-01-17 ENCOUNTER — Encounter (HOSPITAL_COMMUNITY): Payer: Self-pay | Admitting: Family Medicine

## 2019-01-17 ENCOUNTER — Ambulatory Visit (HOSPITAL_COMMUNITY): Payer: Medicare HMO | Admitting: Hematology

## 2019-01-17 DIAGNOSIS — Z7189 Other specified counseling: Secondary | ICD-10-CM

## 2019-01-17 DIAGNOSIS — Z515 Encounter for palliative care: Secondary | ICD-10-CM

## 2019-01-17 LAB — IMMUNOFIXATION ELECTROPHORESIS
IgA: 192 mg/dL (ref 64–422)
IgG (Immunoglobin G), Serum: 786 mg/dL (ref 586–1602)
IgM (Immunoglobulin M), Srm: 107 mg/dL (ref 26–217)
Total Protein ELP: 5.1 g/dL — ABNORMAL LOW (ref 6.0–8.5)

## 2019-01-17 LAB — KAPPA/LAMBDA LIGHT CHAINS
Kappa free light chain: 46.8 mg/L — ABNORMAL HIGH (ref 3.3–19.4)
Kappa, lambda light chain ratio: 0.86 (ref 0.26–1.65)
Lambda free light chains: 54.5 mg/L — ABNORMAL HIGH (ref 5.7–26.3)

## 2019-01-17 MED ORDER — MORPHINE SULFATE (PF) 2 MG/ML IV SOLN
1.0000 mg | INTRAVENOUS | Status: DC | PRN
  Filled 2019-01-17: qty 1

## 2019-01-17 MED ORDER — HYDROMORPHONE HCL 1 MG/ML IJ SOLN
0.5000 mg | INTRAMUSCULAR | Status: DC
  Administered 2019-01-17: 0.5 mg via INTRAVENOUS
  Filled 2019-01-17: qty 0.5

## 2019-01-17 MED ORDER — LEVALBUTEROL HCL 0.63 MG/3ML IN NEBU: 0.6300 mg | INHALATION_SOLUTION | Freq: Three times a day (TID) | RESPIRATORY_TRACT | Status: DC | PRN

## 2019-01-17 MED ORDER — MORPHINE SULFATE (PF) 2 MG/ML IV SOLN: 2.0000 mg | INTRAVENOUS | Status: DC

## 2019-01-17 MED ORDER — MORPHINE SULFATE (CONCENTRATE) 10 MG/0.5ML PO SOLN: 5.0000 mg | ORAL | Status: DC | PRN

## 2019-01-17 MED ORDER — HYDROMORPHONE HCL 1 MG/ML IJ SOLN: 0.5000 mg | INTRAMUSCULAR | Status: DC | PRN

## 2019-01-18 LAB — IMMUNOFIXATION, URINE

## 2019-01-19 LAB — CULTURE, BODY FLUID W GRAM STAIN -BOTTLE: Culture: NO GROWTH

## 2019-02-02 NOTE — Progress Notes (Signed)
Family came to see Charlotte [name], after they left Charlotte [name] quit breathing and heart stopped. Notified family, Dr. Wynetta Emery who is filling out death certificate. Called patient placement and they called [facility] for me. Will place body in body and get AC to transport to

## 2019-02-02 NOTE — Consult Note (Signed)
Consultation Note Date: January 18, 2019   Patient Name: Charlotte [name]  DOB: 19-Sep-1940  MRN: [id number]  Age / Sex: 78 y.o., female  PCP: [name], MD Referring Physician: Murlean Iba, MD  Reason for Consultation: Establishing goals of care  HPI/Patient Profile: 78 y.o. female  with past medical history of atrial fibrillation on Coumadin, CHF, COPD on 4L at oxygen at home, HTN, PVD, iron deficiency anemia, right leg cellulitis, deafness, polycythemia vera, anxiety admitted on 01/19/2019 with worsening dyspnea s/t anemia, atrial fibrillation RVR, underlying COPD. Over course of hospitalization she has continued to decline with respiratory failure s/pbilateral pleural effusions and thoracentesis, CHF overload, pneumonia, atrial fib RVR, and continued worsening renal failure not allowing for adequate diuresis. Has progressed to EOL and husband has opted for comfort care.   Clinical Assessment and Goals of Care: I met today at Ms. Lueck' bedside with her husband of 64 yrs and 2 of 4 of her sons. Ms. Luevano is able to open eyes and speak in a few words to them but is fatigued and tachypneic. She does not answer when I ask her about pain or work of breathing. She appears uncomfortable to me.   I spent some time discussing her multiorgan failure with her family. They confirm that the goal is that she is comfortable and priority that she does not suffer. We discussed plans to utilize medications to keep her comfortable and discussed signs of discomfort. Husband tells me that if she needs to leave the hospital than he would want her to be home and not at [facility] or hospice facility. I think for now we can work on managing her symptoms and I shared that I did not feel she were stable to be able to leave the hospital and he is fine with keeping her here and focusing on comfort. I did speak with them about transfer out of ICU  and to regular floor to continue comfort care if she remains stable. Continue comfort care and emotional support to family.   All questions/concerns addressed. Emotional support provided to family at bedside.   Discussed plan with RN and Dr. Wynetta Emery. I will continue to follow and support.   Primary Decision Maker NEXT OF KIN Husband    SUMMARY OF RECOMMENDATIONS   - Comfort care - Anticipate hospital death (not stable for transfer)  Code Status/Advance Care Planning:  DNR   Symptom Management:   Dyspnea: Dilaudid 0.5 mg every 4 hours scheduled and 0.5-1 mg prn as needed to achieve comfort. I will have RN provide dose now as she is tachypneic and dyspneic. I have changed from morphine to dilaudid d/t renal dysfunction to avoid build up of morphine and potential of myclonus if needs further doses of morphine.   PRN medications available as needed to ensure comfort.   Palliative Prophylaxis:   Aspiration, Frequent Pain Assessment, Oral Care and Turn Reposition  Additional Recommendations (Limitations, Scope, Preferences):  Full Comfort Care  Psycho-social/Spiritual:   Desire for further Chaplaincy support:yes  Additional Recommendations: Grief/Bereavement  Support  Prognosis:   Hours - Days  Discharge Planning: Anticipated Hospital Death      Primary Diagnoses: Present on Admission: . Atrial fibrillation with RVR ([place]) . PVD (peripheral vascular disease) ([place]) . Polycythemia vera ([place]) . Iron deficiency anemia due to chronic blood loss . Hypothyroidism . Hypoxemia . HTN (hypertension) . Hyperlipidemia . GERD (gastroesophageal reflux disease) . Chronic atrial fibrillation . Chronic respiratory failure with hypoxia ([place]) . Anemia . Acute on chronic respiratory failure with hypoxia ([place]) . Acute renal failure (ARF) ([place]) . Pressure ulcer   I have reviewed the medical record, interviewed the patient and family, and examined the patient. The following aspects  are pertinent.  Past Medical History:  Diagnosis Date  . Anxiety   . Arthritis   . Atrial fibrillation, chronic    a. on Coumadin for anticoagulation.   . Cellulitis 06/29/2017   Right leg  . Chronic hypoxemic respiratory failure (HCC)    4 lpm  . COPD (chronic obstructive pulmonary disease) ([place])   . 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 DVT (deep venous thrombosis) ([place])   . Essential hypertension   . GERD (gastroesophageal reflux disease)   . HOH (hard of hearing)   . Hyperlipidemia   . Hypothyroidism   . Iron deficiency anemia due to chronic blood loss 03/18/2018  . Open wound    Right groin  . Peripheral neuropathy   . Peripheral vascular disease ([place])   . 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 Ulcer of ankle ([place])    Bilateral 2015  . Varicose veins    Social History   Socioeconomic History  . Marital status: Married    Spouse name: Not on file  . Number of children: Not on file  . Years of education: Not on file  . Highest education level: Not on file  Occupational History  . Not on file  Social Needs  . Financial resource strain: Not on file  . Food insecurity    Worry: Not on file    Inability: Not on file  . Transportation needs    Medical: Not on file    Non-medical: Not on file  Tobacco Use  . Smoking status: Never Smoker  . Smokeless tobacco: Never Used  Substance and Sexual Activity  . Alcohol use: No    Alcohol/week: 0.0 standard drinks  . Drug use: No  . Sexual activity: Not Currently  Lifestyle  . Physical activity    Days per week: Not on file    Minutes per session: Not on file  . Stress: Not on file  Relationships  . Social Herbalist on phone: Not on file    Gets together: Not on file    Attends religious service: Not on file    Active member of club or organization: Not on file    Attends meetings of clubs or organizations: Not on file    Relationship status: Not on file  Other Topics Concern  . Not on file  Social  History Narrative  . Not on file   Family History  Problem Relation Age of Onset  . Heart failure Mother   . Heart disease Mother   . Stroke Father   . Heart disease Sister   . Diabetes Son   . Hypertension Sister   . Hypertension Sister   . Stroke Sister    Scheduled Meds: . Chlorhexidine Gluconate Cloth  6 each Topical Daily  . mouth rinse  15 mL Mouth Rinse BID  . pantoprazole  40 mg Oral Daily  . sodium chloride flush  10-40 mL Intracatheter Q12H   Continuous Infusions: PRN Meds:.acetaminophen **OR** acetaminophen, antiseptic oral rinse, bisacodyl, diphenhydrAMINE, glycopyrrolate **OR** glycopyrrolate **OR** glycopyrrolate, ipratropium, levalbuterol, LORazepam **OR** LORazepam **OR** LORazepam, morphine injection, morphine CONCENTRATE **OR** morphine CONCENTRATE, ondansetron **OR** ondansetron (ZOFRAN) IV, polyvinyl alcohol, senna-docusate, sodium chloride flush No Known Allergies Review of Systems  Unable to perform ROS: Acuity of condition    Physical Exam Vitals signs and nursing note reviewed.  Constitutional:      General: She is in acute distress.     Appearance: She is cachectic. She is ill-appearing.     Comments: Responsive briefly at times but inconsistent  Cardiovascular:     Rate and Rhythm: Tachycardia present. Rhythm irregularly irregular.  Pulmonary:     Effort: Tachypnea, accessory muscle usage and respiratory distress present.  Neurological:     Comments: Alert at times but does not answer my questions but does recognize and greet her family briefly when they address her     Vital Signs: BP (!) 69/55 (BP Location: Right Arm)   Pulse (!) 126   Temp 97.7 F (36.5 C) (Axillary)   Resp (!) 28   Ht 6' (1.829 m)   Wt 69.6 kg   LMP  (LMP Unknown)   SpO2 (!) 79%   BMI 20.81 kg/m  Pain Scale: 0-10 POSS *See Group Information*: 2-Acceptable,Slightly drowsy, easily aroused Pain Score: 3    SpO2: SpO2: (!) 79 % O2 Device:SpO2: (!) 79 % O2 Flow  Rate: .O2 Flow Rate (L/min): 1 L/min  IO: Intake/output summary:   Intake/Output Summary (Last 24 hours) at 02/13/2019 1018 Last data filed at 01/16/2019 2118 Gross per 24 hour  Intake 220 ml  Output -  Net 220 ml    LBM: Last BM Date: 01/16/19 Baseline Weight: Weight: 70.8 kg Most recent weight: Weight: 69.6 kg     Palliative Assessment/Data: 20%     Time In/Out: 0950-1015, 1055-1130 Time Total: 60 min Greater than 50%  of this time was spent counseling and coordinating care related to the above assessment and plan.  Signed by: Vinie Sill, NP Palliative Medicine Team Pager # [phone] (M-F 8a-5p) Team Phone # [phone] (Nights/Weekends)

## 2019-02-02 NOTE — Progress Notes (Signed)
Pt refusing pain medication at this time

## 2019-02-02 NOTE — Progress Notes (Addendum)
PROGRESS NOTE    PERRIE RAGIN  OIL:[id number]  DOB: Jun 30, 1941  DOA: 01/15/2019 PCP: [name], MD   Brief Admission Hx: 78 year old female with chronic atrial fibrillation fully anticoagulated with warfarin, combined CHF, COPD, oxygen dependent, hypertension, hypothyroidism and hyperlipidemia, deafness, polycythemia vera and other multiple medical comorbidities presented with severe shortness of breath, uncontrolled heart rate A. fib RVR.  Patient was treated with maximal medical therapy for multiple days however she had no meaningful improvement and after discussions with the patient's family she has been transitioned to full comfort care.  MDM/Assessment & Plan:   1. Acute on chronic respiratory failure and acute on chronic renal failure - pt is now on full comfort care and is very comfortable and happy that family has been able to visit.  I have asked palliative care team to assist with disposition home versus residential hospice.  Hospital death would not be unexpected at this point. Family members updated.    Subjective: Pt asking for water, says she feels fine.    Objective: Vitals:   Feb 06, 2019 0500 06-Feb-2019 0700 2019-02-06 0800 06-Feb-2019 0900  BP:      Pulse:  (!) 152 (!) 148 (!) 126  Resp:  (!) 27 (!) 31 (!) 28  Temp:      TempSrc:      SpO2:  (!) 75% (!) 74% (!) 79%  Weight: 69.6 kg     Height:        Intake/Output Summary (Last 24 hours) at Feb 06, 2019 0956 Last data filed at 01/16/2019 2118 Gross per 24 hour  Intake 220 ml  Output -  Net 220 ml   Filed Weights   01/15/19 0500 01/16/19 0500 02/06/19 0500  Weight: 69.6 kg 69.6 kg 69.6 kg     REVIEW OF SYSTEMS  As per history otherwise all reviewed and reported negative  Exam:  General exam: awake, alert, appears comfortable Respiratory system:  No increased work of breathing. Cardiovascular system: normal S1 & S2 heard.  Gastrointestinal system: Abdomen is nondistended, soft and nontender. Normal  bowel sounds heard. Central nervous system: Alert and oriented. No focal neurological deficits. Extremities: bilateral heel protection.   Data Reviewed: Basic Metabolic Panel: Recent Labs  Lab 01/11/19 0555 01/12/19 0415 01/13/19 0436 01/14/19 0452 01/15/19 0425 01/16/19 0432  NA 136 134* 134* 134* 135 132*  K 4.3 4.5 4.3 4.3 3.9 4.0  CL 106 102 103 102 101 98  CO2 19* 18* 22 22 17* 18*  GLUCOSE 74 81 103* 91 94 94  BUN 59* 69* 77* 84* 90* 97*  CREATININE 1.80* 2.02* 2.24* 2.46* 2.64* 3.24*  CALCIUM 8.2* 8.1* 8.2* 8.1* 8.2* 8.0*  MG 2.2 2.2  --  2.4  --  2.5*  PHOS 4.9* 5.7*  --  5.0* 5.2* 5.7*   Liver Function Tests: Recent Labs  Lab 01/11/19 0555 01/12/19 0415 01/14/19 0452 01/15/19 0425 01/16/19 0432  AST 11* 11*  --   --   --   ALT 8 8  --   --   --   ALKPHOS 54 58  --   --   --   BILITOT 1.5* 1.6*  --   --   --   PROT 5.6* 5.7*  --   --   --   ALBUMIN 2.9* 2.8* 2.7* 2.6* 2.5*   No results for input(s): LIPASE, AMYLASE in the last 168 hours. No results for input(s): AMMONIA in the last 168 hours. CBC: Recent Labs  Lab 01/11/19 0555 01/12/19  1740 01/13/19 0436 01/14/19 0452 01/15/19 0425 01/16/19 0432  WBC 5.6 9.2 12.0* 11.4* 11.2* 10.3  NEUTROABS 4.8 8.1*  --   --   --   --   HGB 8.2* 8.2* 8.0* 8.2* 8.2* 8.3*  HCT 26.2* 25.8* 25.1* 25.8* 27.1* 26.3*  MCV 130.3* 127.7* 128.7* 127.1* 128.4* 126.4*  PLT 252 250 247 286 277 258   Cardiac Enzymes: No results for input(s): CKTOTAL, CKMB, CKMBINDEX, TROPONINI in the last 168 hours. CBG (last 3)  Recent Labs    01/15/19 0749 01/15/19 1140 01/16/19 0750  GLUCAP 99 110* 104*   Recent Results (from the past 240 hour(s))  Blood culture (routine x 2)     Status: None   Collection Time: 01/23/2019 12:00 PM   Specimen: BLOOD RIGHT ARM  Result Value Ref Range Status   Specimen Description BLOOD RIGHT ARM  Final   Special Requests   Final    Blood Culture adequate volume BOTTLES DRAWN AEROBIC AND ANAEROBIC    Culture   Final    NO GROWTH 5 DAYS Performed at [facility], [address], [place], [place] [address]    Report Status 01/13/2019 FINAL  Final  SARS Coronavirus 2 (CEPHEID - Performed in [place] hospital lab), Hosp Order     Status: None   Collection Time: 01/18/2019 12:17 PM   Specimen: Nasopharyngeal Swab  Result Value Ref Range Status   SARS Coronavirus 2 NEGATIVE NEGATIVE Final    Comment: (NOTE) If result is NEGATIVE SARS-CoV-2 target nucleic acids are NOT DETECTED. The SARS-CoV-2 RNA is generally detectable in upper and lower  respiratory specimens during the acute phase of infection. The lowest  concentration of SARS-CoV-2 viral copies this assay can detect is 250  copies / mL. A negative result does not preclude SARS-CoV-2 infection  and should not be used as the sole basis for treatment or other  patient management decisions.  A negative result may occur with  improper specimen collection / handling, submission of specimen other  than nasopharyngeal swab, presence of viral mutation(s) within the  areas targeted by this assay, and inadequate number of viral copies  (<250 copies / mL). A negative result must be combined with clinical  observations, patient history, and epidemiological information. If result is POSITIVE SARS-CoV-2 target nucleic acids are DETECTED. The SARS-CoV-2 RNA is generally detectable in upper and lower  respiratory specimens dur ing the acute phase of infection.  Positive  results are indicative of active infection with SARS-CoV-2.  Clinical  correlation with patient history and other diagnostic information is  necessary to determine patient infection status.  Positive results do  not rule out bacterial infection or co-infection with other viruses. If result is PRESUMPTIVE POSTIVE SARS-CoV-2 nucleic acids MAY BE PRESENT.   A presumptive positive result was obtained on the submitted specimen  and confirmed on repeat testing.  While 2019 novel  coronavirus  (SARS-CoV-2) nucleic acids may be present in the submitted sample  additional confirmatory testing may be necessary for epidemiological  and / or clinical management purposes  to differentiate between  SARS-CoV-2 and other Sarbecovirus currently known to infect humans.  If clinically indicated additional testing with an alternate test  methodology [phone]) is advised. The SARS-CoV-2 RNA is generally  detectable in upper and lower respiratory sp ecimens during the acute  phase of infection. The expected result is Negative. Fact Sheet for Patients:  StrictlyIdeas.no Fact Sheet for Healthcare Providers: BankingDealers.co.za This test is not yet approved or cleared  by the [place] and has been authorized for detection and/or diagnosis of SARS-CoV-2 by FDA under an Emergency Use Authorization (EUA).  This EUA will remain in effect (meaning this test can be used) for the duration of the COVID-19 declaration under Section 564(b)(1) of the Act, 21 U.S.C. section 360bbb-3(b)(1), unless the authorization is terminated or revoked sooner. Performed at [facility], [address] [place], [place] [address]   Blood culture (routine x 2)     Status: None   Collection Time: 01/26/2019 12:46 PM   Specimen: Left Antecubital; Blood  Result Value Ref Range Status   Specimen Description LEFT ANTECUBITAL  Final   Special Requests   Final    BOTTLES DRAWN AEROBIC AND ANAEROBIC Blood Culture adequate volume   Culture   Final    NO GROWTH 5 DAYS Performed at [facility], [address] [place], [place] [address]    Report Status 01/13/2019 FINAL  Final  MRSA PCR Screening     Status: None   Collection Time: 01/26/2019  7:57 PM   Specimen: Nasal Mucosa; Nasopharyngeal  Result Value Ref Range Status   MRSA by PCR NEGATIVE NEGATIVE Final    Comment:        The GeneXpert MRSA Assay (FDA approved for NASAL specimens only), is one  component of a comprehensive MRSA colonization surveillance program. It is not intended to diagnose MRSA infection nor to guide or monitor treatment for MRSA infections. Performed at [facility], [address], [place], [place] [address]   Fungus Culture With Stain     Status: None (Preliminary result)   Collection Time: 01/10/19 12:42 PM  Result Value Ref Range Status   Fungus Stain Final report  Final    Comment: (NOTE) Performed At: [facility] [address] [place], [place] [id number] Rush Farmer MD ME:[id number]    Fungus (Mycology) Culture PENDING  Incomplete   Fungal Source PLEURAL  Final    Comment: Performed at [facility], [address], [place], [place] [address]  Fungus Culture Result     Status: None   Collection Time: 01/10/19 12:42 PM  Result Value Ref Range Status   Result 1 Comment  Final    Comment: (NOTE) KOH/Calcofluor preparation:  no fungus observed. Performed At: [facility] [place], [place] [id number] Rush Farmer MD YC:[id number]   Gram stain     Status: None   Collection Time: 01/10/19 12:43 PM   Specimen: Pleura; Body Fluid  Result Value Ref Range Status   Specimen Description PLEURAL  Final   Special Requests NONE  Final   Gram Stain   Final    CYTOSPIN SMEAR NO ORGANISMS SEEN WBC PRESENT,BOTH PMN AND MONONUCLEAR Performed at [facility], [address] [place], [place] [address]    Report Status 01/10/2019 FINAL  Final  Culture, body fluid-bottle     Status: None (Preliminary result)   Collection Time: 01/14/19  2:38 PM   Specimen: Pleura  Result Value Ref Range Status   Specimen Description PLEURAL  Final   Special Requests BOTTLES DRAWN AEROBIC AND ANAEROBIC 10 CC EACH  Final   Culture   Final    NO GROWTH 2 DAYS Performed at [facility], [address] [place], [place] [address]    Report Status PENDING  Incomplete  Gram stain     Status: None   Collection Time: 01/14/19  2:38 PM    Specimen: Pleural, Right; Pleural Fluid  Result Value Ref Range  Status   Specimen Description PLEURAL  Final   Special Requests NONE  Final   Gram Stain   Final    CYTOSPIN SMEAR NO ORGANISMS SEEN WBC PRESENT,BOTH PMN AND MONONUCLEAR Performed at [facility], [address], [place], [place] [address]    Report Status 01/14/2019 FINAL  Final     Studies: Dg Chest Port 1 View  Result Date: 01/16/2019 CLINICAL DATA:  Pulmonary edema. Atrial fibrillation. COPD. EXAM: PORTABLE CHEST 1 VIEW COMPARISON:  01/14/2019 FINDINGS: Stable cardiomegaly and diffuse interstitial infiltrates likely due to interstitial edema. Stable large left pleural effusion and small right pleural effusion. No significant change compared to prior study. IMPRESSION: No significant change compared to prior exam. Electronically Signed   By: Earle Gell M.D.   On: 01/16/2019 08:10     Scheduled Meds: . Chlorhexidine Gluconate Cloth  6 each Topical Daily  . mouth rinse  15 mL Mouth Rinse BID  . pantoprazole  40 mg Oral Daily  . sodium chloride flush  10-40 mL Intracatheter Q12H   Continuous Infusions:  Active Problems:   Polycythemia vera (HCC)   Chronic atrial fibrillation   Anticoagulant long-term use   HTN (hypertension)   Hypothyroidism   PVD (peripheral vascular disease) (HCC)   SOB (shortness of breath)   Pleural effusion on right   Hypoxemia   CHF (congestive heart failure) (HCC)   Acute on chronic respiratory failure with hypoxia (HCC)   Anemia   Chronic respiratory failure with hypoxia (HCC)   Hyperlipidemia   Acute renal failure (ARF) (HCC)   Pressure ulcer   GERD (gastroesophageal reflux disease)   Iron deficiency anemia due to chronic blood loss   Atrial fibrillation with RVR ([place])   Pneumothorax on left   Status post thoracentesis   Time spent:   Irwin Brakeman, MD [facility] 02-04-19, 9:56 AM    LOS: 9 days  How to contact the [facility] Attending or Consulting provider 7A -  7P or covering provider during after hours [place], for this patient?  1. Check the care team in [facility] and look for a) attending/consulting [facility] provider listed and b) the [facility] team listed 2. Log into [email or web address] and use [place]'s universal password to access. If you do not have the password, please contact the hospital operator. 3. Locate the [facility] provider you are looking for under [facility] and page to a number that you can be directly reached. 4. If you still have difficulty reaching the provider, please page the [facility] (Director on Call) for the Hospitalists listed on amion for assistance.

## 2019-02-02 NOTE — Discharge Summary (Signed)
Expiration Note  Charlotte [name]  MR#: [id number]  DOB:1941/04/09  Date of Admission: 2019-01-18 Date of Death: 01/27/19 pronounced at 59 am  Attending Charlotte [name]  Patient's PCP: Charlotte Sites, MD  Consults:  cardiology, radiology, nephrology  Cause of Death: Acute on chronic respiratory failure with hypoxia Acute on chronic renal failure Atrial fibrillation with RVR Congestive heart failure   Secondary Diagnoses Present on Admission: . Atrial fibrillation with RVR (Charlotte [name]) . PVD (peripheral vascular disease) (Section) . Polycythemia vera (Charlotte [name]) . Iron deficiency anemia due to chronic blood loss . Hypothyroidism . Hypoxemia . HTN (hypertension) . Hyperlipidemia . GERD (gastroesophageal reflux disease) . Chronic atrial fibrillation . Chronic respiratory failure with hypoxia (Charlotte [name]) . Anemia . Acute on chronic respiratory failure with hypoxia (Charlotte [name]) . Acute renal failure (ARF) (Charlotte [name]) . Pressure ulcer  Hospital Course: Dr. Fenton Malling HPI: Charlotte [name] is a 78 y.o. 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  States she has not been around any sick contacts and denies any chest pain, lightheadedness or dizziness but states that her breathing is significantly worsened and she currently does wear 4 L oxygen.  He is not having any cough or productive sputum and denies any fevers.  States that she is also  been feeling weaker and a little bit more tired.  She was worked up and found to be in atrial fibrillation RVR and was given a small 500 mL bolus with improvement in her heart rate.  Patient still remained dyspneic and oxygen requirement had to be escalated to 6 L. [facility] was called admit this patient for her acute on chronic hypoxic respiratory failure and dyspnea and is also found that she had a worsening anemia and was found to be FOBT positive so GI was consulted and will see the patient in the a.m.  ED Course: She had basic blood work done including CBC, CMP, had a chest x-ray done EKG and given a 500 mL bolus.  She was given 40 mg of IV Protonix and gastroenterology Dr. Gala [name] was consulted and he will see the patient tomorrow for her anemia and FOBT positive.  Brief Admission Hx: 78 year old female with chronic atrial fibrillation fully anticoagulated with warfarin, combined CHF, COPD, oxygen dependent, hypertension, hypothyroidism and hyperlipidemia, deafness, polycythemia vera and other multiple medical comorbidities presented with severe shortness of breath, uncontrolled heart rate A. fib RVR.  Patient was treated with maximal medical therapy for multiple days however she had no meaningful improvement and after discussions with the patient's family she has been transitioned to full comfort care.    1. Large bilateral pleural effusions with significant atelectasis- patient is status post left thoracentesis with 1200 mL of fluid removed.  Dr Charlotte [name] says that he cannot tap right side if pneumothorax on left is not resolved.   Pt is s/p right thoracentesis 6/12 where 1.1 L fluid was removed and xray shows no  Pneumothorax.  Pt says her breathing is a little better but still requiring HFNC. She is not diuresing.  Her renal  function is worsening.      2. Acute on chronic respiratory failure with hypoxia -patient was volume overloaded and is status post thoracentesis with some improvement in symptoms  however.  She remains in the stepdown unit.  CT of the chest revealed congestive heart failure and large pleural effusions.  SARS-CoV-2 test is negative.  Pt having no meaningful improvement after maximal medical therapies.  I spoke with husband and he is agreeable to transition to comfort care and withdraw full scope treatments.  3. Pneumonia - xray suggesting pneumonia vs atelectasis - with bump in WBC will treat with augmentin 875 mg BID x 3 days.  4. Atrial fibrillation with RVR-pt is treated with IV amiodarone and Cardizem per cardiology service.  Appreciate the assistance of the heart care team.  Pt is hypotensive now.  Plan is to withdraw full scope treatments and transition to full comfort care. DC IV infusions.  5. Small apical pneumothorax- status post thoracentesis.  Repeat imaging shows resolution.     6. Hypothyroidism -Continue home levothyroxine. 7. AKI on CKD stage 4 - Pt having some worsening CKD, appreciate nephrology consult.  Pt's renal function is not improving and patient is not a candidate for diaysis.  Discussed with husband and he is agreeable to full comfort care.    8. Hyperlipidemia-treated with pravastatin.  9. Acute combined CHF exacerbation-Pt remains fluid overloaded and kidneys not responsive to IV lasix.   Appreciate the assistance of the cardiology and nephrology services. 10. Polycythemia vera - pt has been seen by hematologist Dr. Delton [name].  11. Hyperbilirubinemia.  12. GERD - continue protonix daily.   13. PVD - She is followed by Dr. Donzetta [name] with vascular surgery and recently s/p amputation of toe.   DVT prophylaxis: warfarin Code Status: DNR Family Communication: I again had long discussion with husband, patient is not having any meaningful improvement and starting to express not wanting to be stuck for blood draws, etc.  He feels that it is time to withdraw full scope treatment and to focus on comfort care and dignity. He wants to come and visit which we will  allow.  I think that patient is a candidate for residential hospice but will have palliative team discuss further with family tomorrow.     Disposition Plan: hospice   Consultants:  Cardiology  Hematology  Radiology  GI   Procedures:  Echocardiogram  ECHOCARDIOGRAM on 01/09/2019 IMPRESSIONS  1. The left ventricle has normal systolic function with an ejection fraction of 60-65%. The cavity size was normal. Left ventricular diastolic function could not be evaluated secondary to atrial fibrillation. 2. The right ventricle has severely reduced systolic function. The cavity was moderately enlarged. There is no increase in right ventricular wall thickness. Right ventricular systolic pressure is moderately elevated with an estimated pressure of 49.1  mmHg. 3. Left atrial size was severely dilated. 4. Right atrial size was severely dilated. 5. Large pleural effusion. 6. Trivial pericardial effusion is present. 7. No evidence of mitral valve stenosis. 8. No stenosis of the aortic valve. 9. The aortic root and ascending aorta are normal in size and structure. 10. The inferior vena cava was normal in size with <50% respiratory variability. 11. The interatrial septum was not assessed.  FINDINGS Left Ventricle: The left ventricle has normal systolic function, with an ejection fraction of 60-65%. The cavity size was normal. There is no increase in left ventricular wall thickness. Left ventricular diastolic function could not be evaluated  secondary to atrial fibrillation.  Right Ventricle: The right ventricle has severely reduced systolic function. The cavity was moderately enlarged. There is no increase in right ventricular wall thickness. Right ventricular systolic pressure is moderately elevated with an estimated  pressure of 49.1 mmHg. Pacing wire/catheter visualized in the right ventricle.  Left Atrium: Left atrial size was severely dilated.  Right Atrium: Right atrial  size was severely dilated. Right atrial pressure is estimated at 3 mmHg.  Interatrial Septum: The interatrial septum was not assessed.  Pericardium: Trivial pericardial effusion is present. There is a large pleural effusion.  Mitral Valve: The mitral valve is normal in structure. Mitral valve regurgitation is trivial by color flow Doppler. No evidence of mitral valve stenosis.  Tricuspid Valve: The tricuspid valve is normal in structure. Tricuspid valve regurgitation is mild by color flow Doppler.  Aortic Valve: The aortic valve is normal in structure. Aortic valve regurgitation was not visualized by color flow Doppler. There is No stenosis of the aortic valve.  Pulmonic Valve: The pulmonic valve was grossly normal. Pulmonic valve regurgitation is trivial by color flow Doppler.  Aorta: The aortic root and ascending aorta are normal in size and structure.  Venous: The inferior vena cava is normal in size with less than 50% respiratory variability.  Signed: Irwin Brakeman Feb 13, 2019, 11:51 AM  How to contact the [facility] Attending or Consulting provider [place] or covering provider during after hours [place], for this patient?  1. Check the care team in [facility] and look for a) attending/consulting [facility] provider listed and b) the [facility] team listed 2. Log into [email or web address] and use [place]'s universal password to access. If you do not have the password, please contact the hospital operator. 3. Locate the [facility] provider you are looking for under [facility] and page to a number that you can be directly reached. 4. If you still have difficulty reaching the provider, please page the [facility] (Director on Call) for the Hospitalists listed on amion for assistance.

## 2019-02-02 DEATH — deceased

## 2019-02-09 LAB — FUNGUS CULTURE RESULT

## 2019-02-09 LAB — FUNGUS CULTURE WITH STAIN

## 2019-02-09 LAB — FUNGAL ORGANISM REFLEX

## 2019-02-16 ENCOUNTER — Ambulatory Visit: Payer: Medicare HMO | Admitting: Cardiovascular Disease

## 2019-04-11 IMAGING — DX DG CHEST 2V
2 series · 2 of 2 positions shown · non-contrast
Comparison: Chest radiograph 07/03/2016.

CLINICAL DATA: Patient with productive cough for multiple days.

EXAM:
CHEST  2 VIEW

[chest pa]
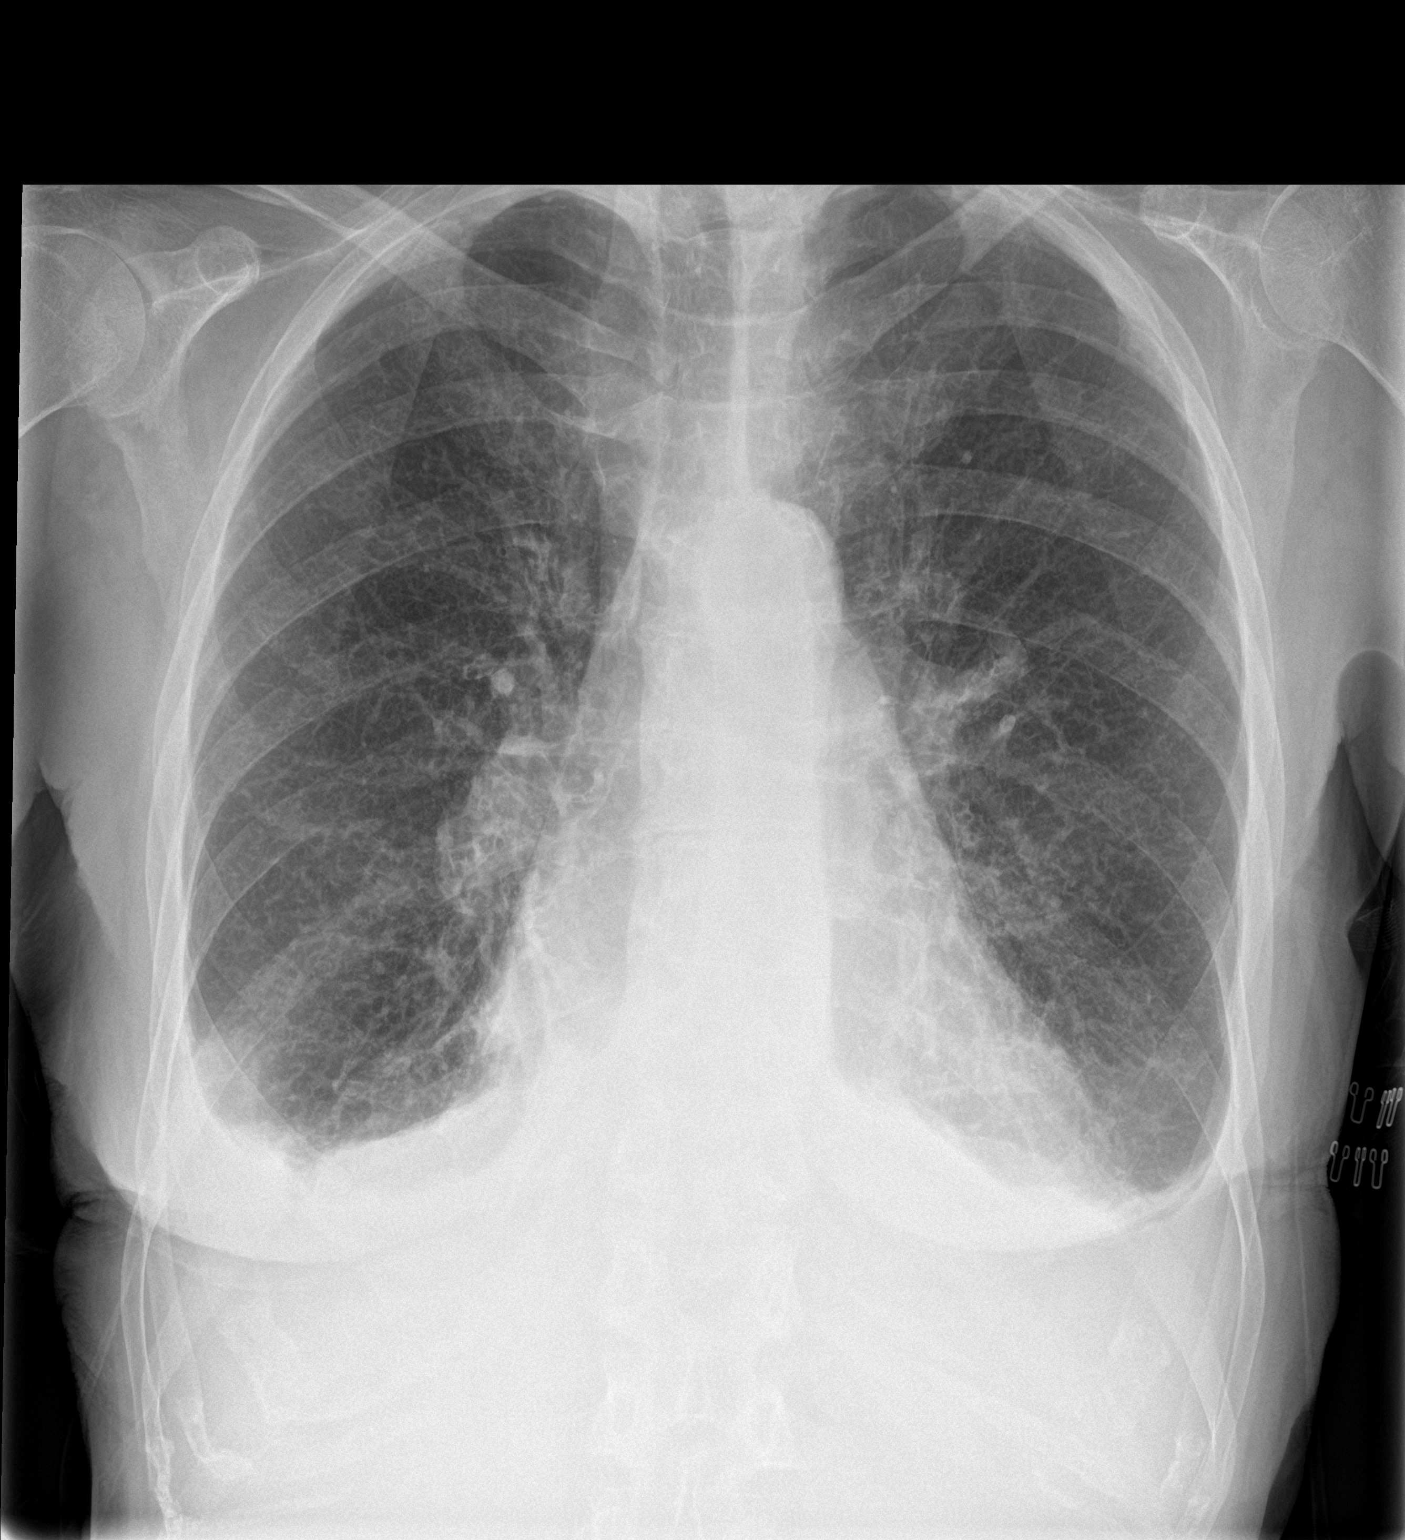

[chest lat]
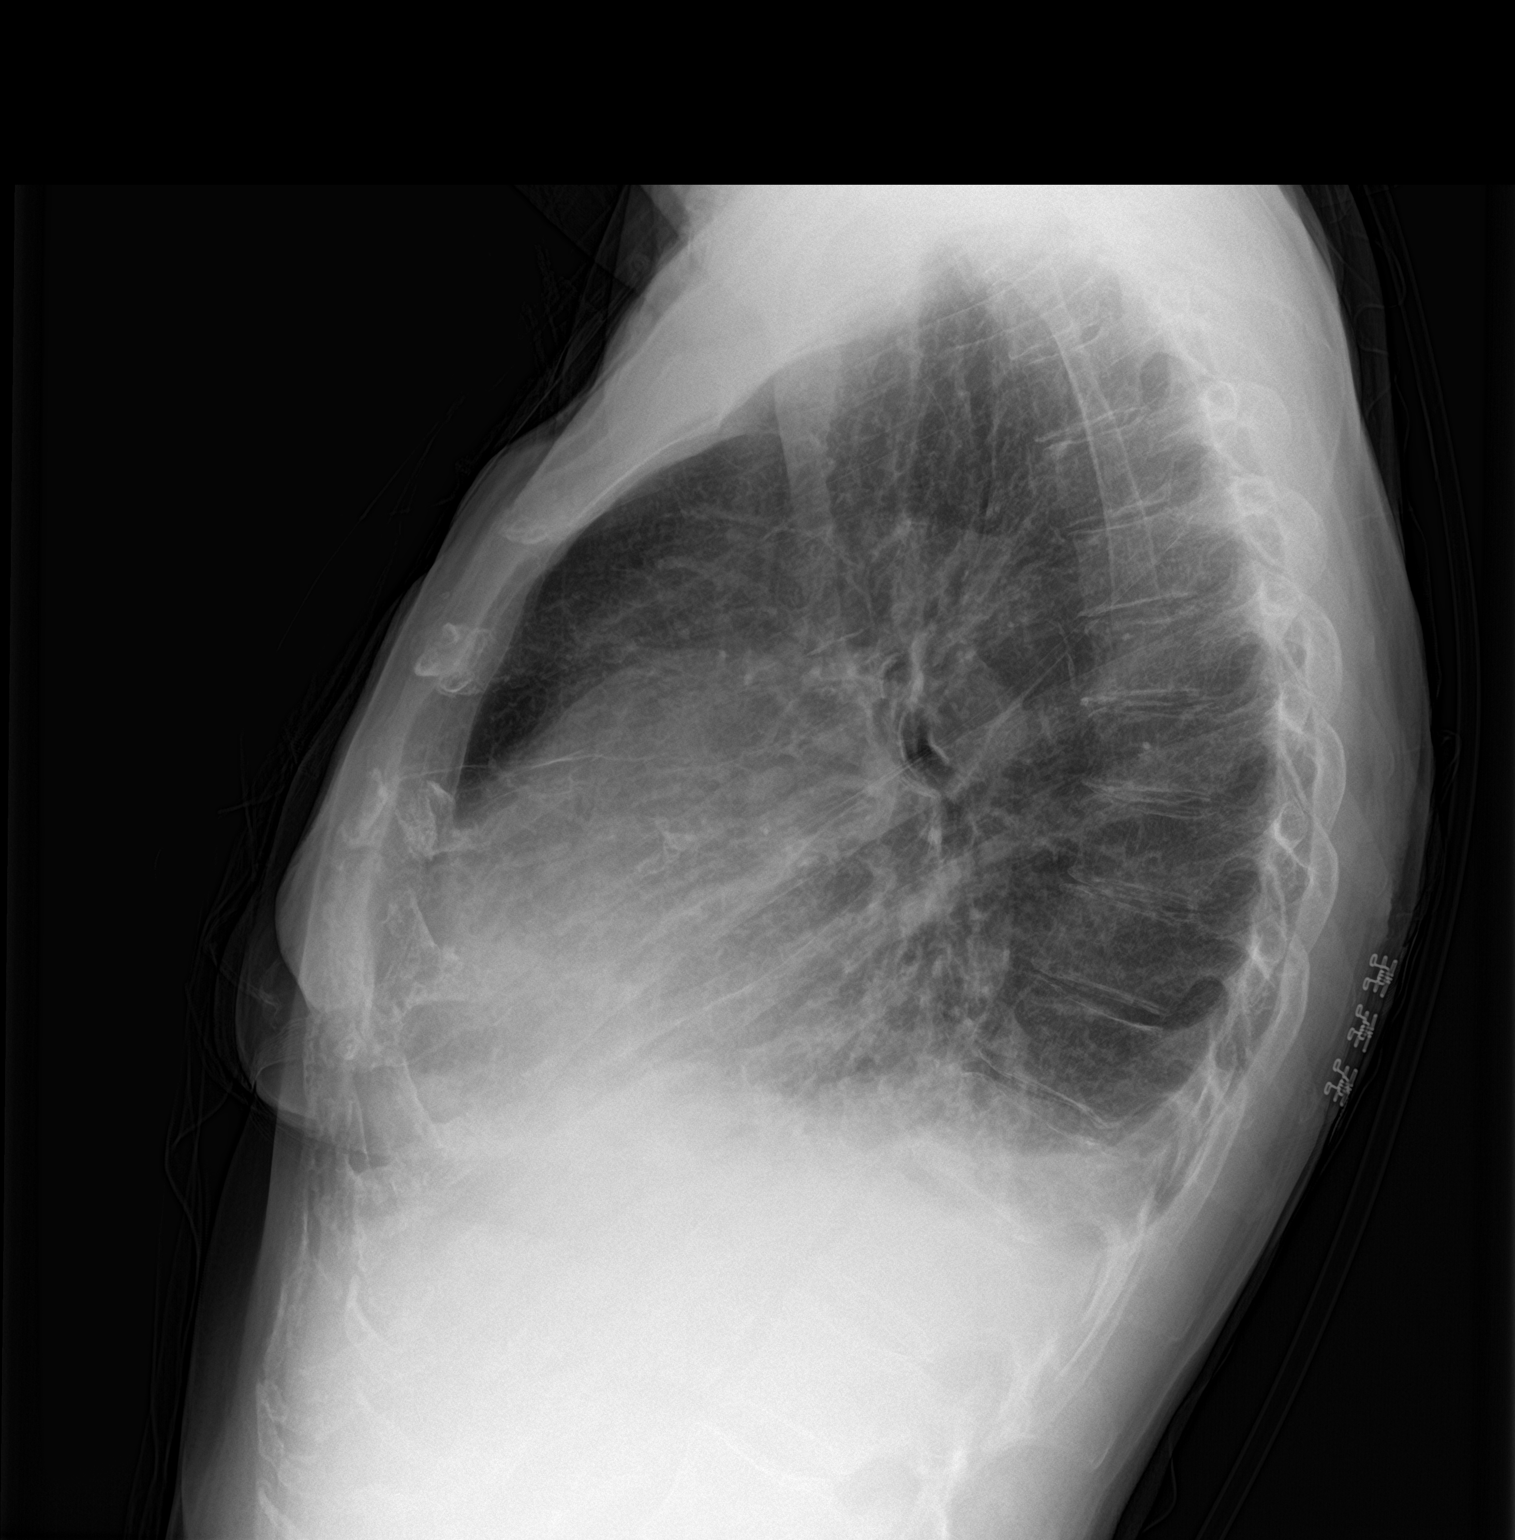

[2 of 2 positions shown; findings below may reference images not displayed]

FINDINGS: Stable cardiomegaly. Small to moderate bilateral pleural effusions
with underlying pulmonary consolidation. No pneumothorax. Thoracic
spine degenerative changes.
IMPRESSION: Small to moderate bilateral pleural effusions with underlying
consolidation which may represent atelectasis or infection.

## 2019-04-29 ENCOUNTER — Ambulatory Visit: Payer: Medicare HMO | Admitting: Family

## 2019-04-29 ENCOUNTER — Other Ambulatory Visit (HOSPITAL_COMMUNITY): Payer: Medicare HMO

## 2019-04-29 ENCOUNTER — Encounter (HOSPITAL_COMMUNITY): Payer: Medicare HMO

## 2019-05-02 ENCOUNTER — Encounter (HOSPITAL_COMMUNITY): Payer: Medicare HMO

## 2019-05-02 ENCOUNTER — Other Ambulatory Visit (HOSPITAL_COMMUNITY): Payer: Medicare HMO

## 2019-05-02 ENCOUNTER — Ambulatory Visit: Payer: Medicare HMO | Admitting: Family

## 2019-05-12 IMAGING — CR DG CHEST 1V PORT
2 series · 2 of 2 positions shown · non-contrast
Comparison: One-view chest x-ray 6/***---*** the one-view chest
x-ray 03/14/2017

CLINICAL DATA: Acute respiratory failure.

EXAM:
PORTABLE CHEST 1 VIEW

[AP (1 of 2)]
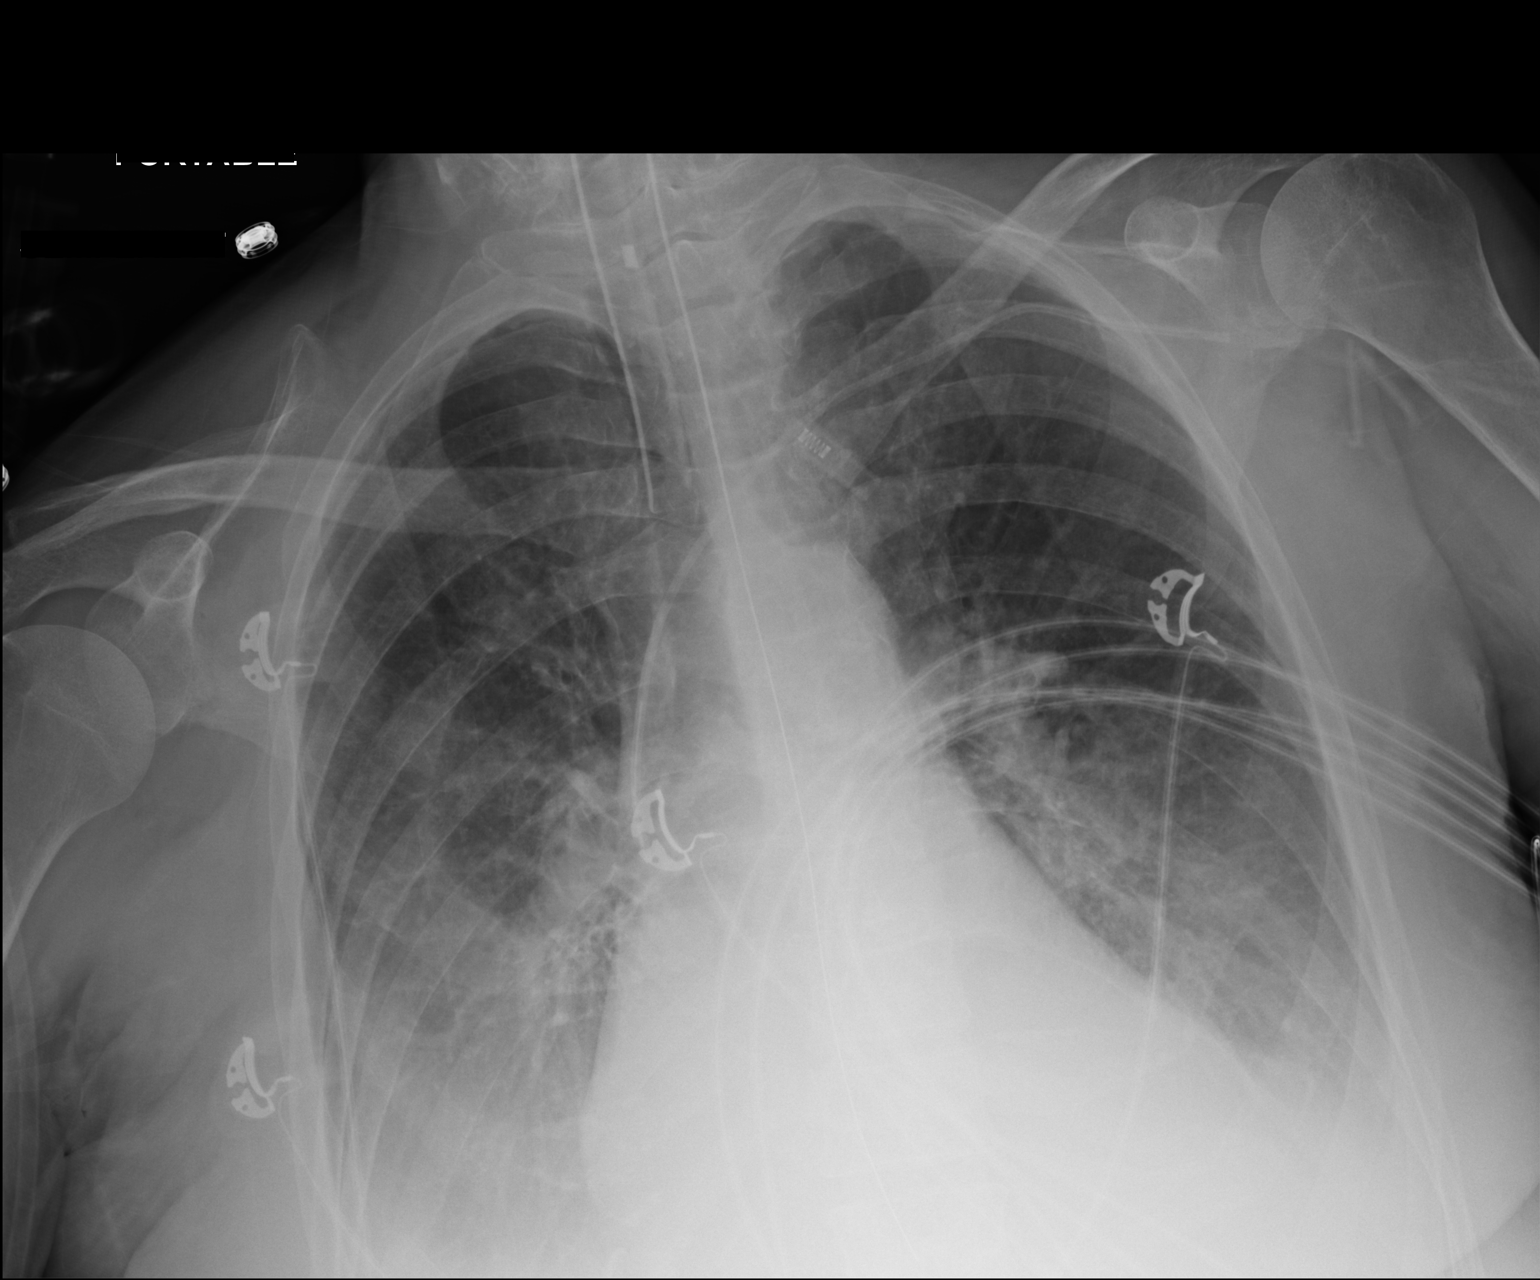

[AP (2 of 2)]
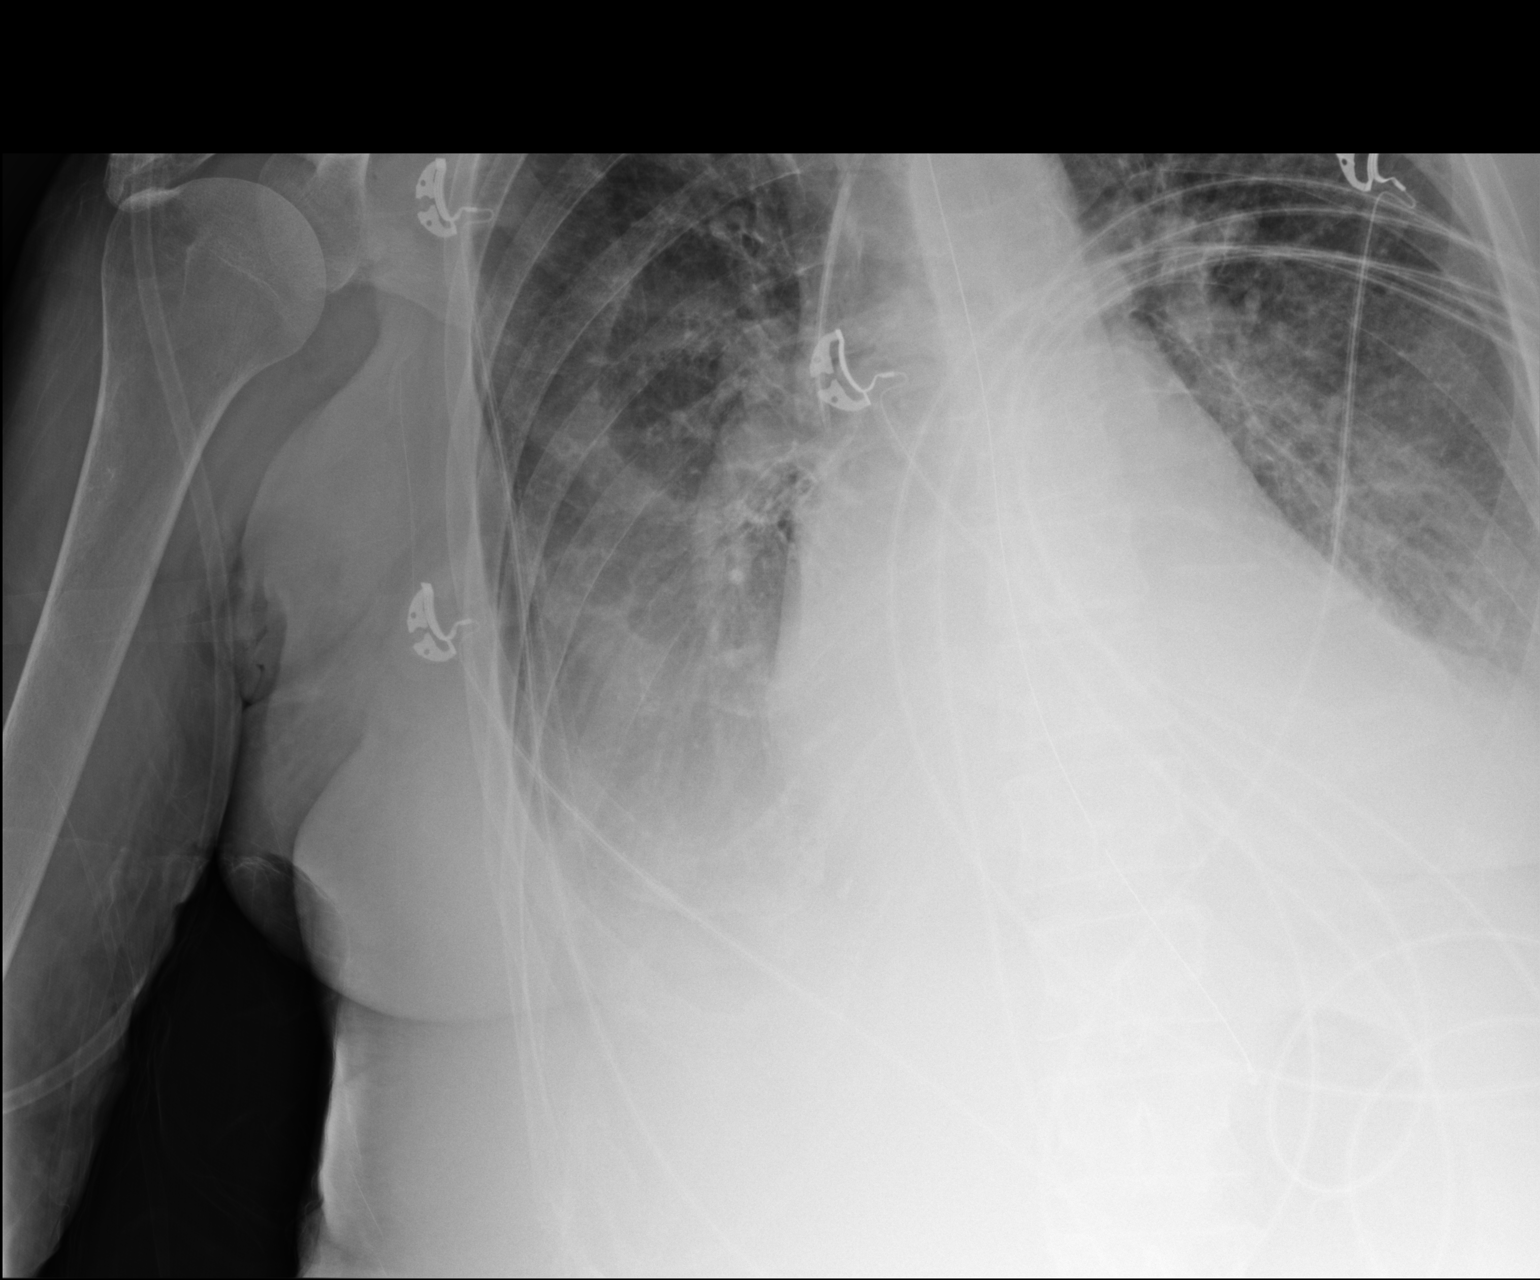

[2 of 2 positions shown; findings below may reference images not displayed]

FINDINGS: Endotracheal tube is stable, 5 cm above the carina. Side port of the
NG tube is just above the GE junction and could be advanced for more
optimal positioning. A left subclavian line is stable.

Cardiac enlargement, mild edema, and bilateral effusions are again
noted. Bibasilar airspace disease is present, likely reflecting
atelectasis. There is no significant interval change.
IMPRESSION: 1. Stable cardiomegaly, mild edema, and pleural effusions compatible
with congestive heart failure.
2. Support apparatus is stable.
3. The side port of the NG tube is likely just above the GE junction
and could be advanced 6-10 cm for more optimal positioning.

## 2019-05-14 IMAGING — CR DG CHEST 1V PORT
2 series · 2 of 2 positions shown · non-contrast
Comparison: 03/15/2017.

CLINICAL DATA: Respiratory failure.  Hypoxia.

EXAM:
PORTABLE CHEST 1 VIEW

[AP (1 of 2)]
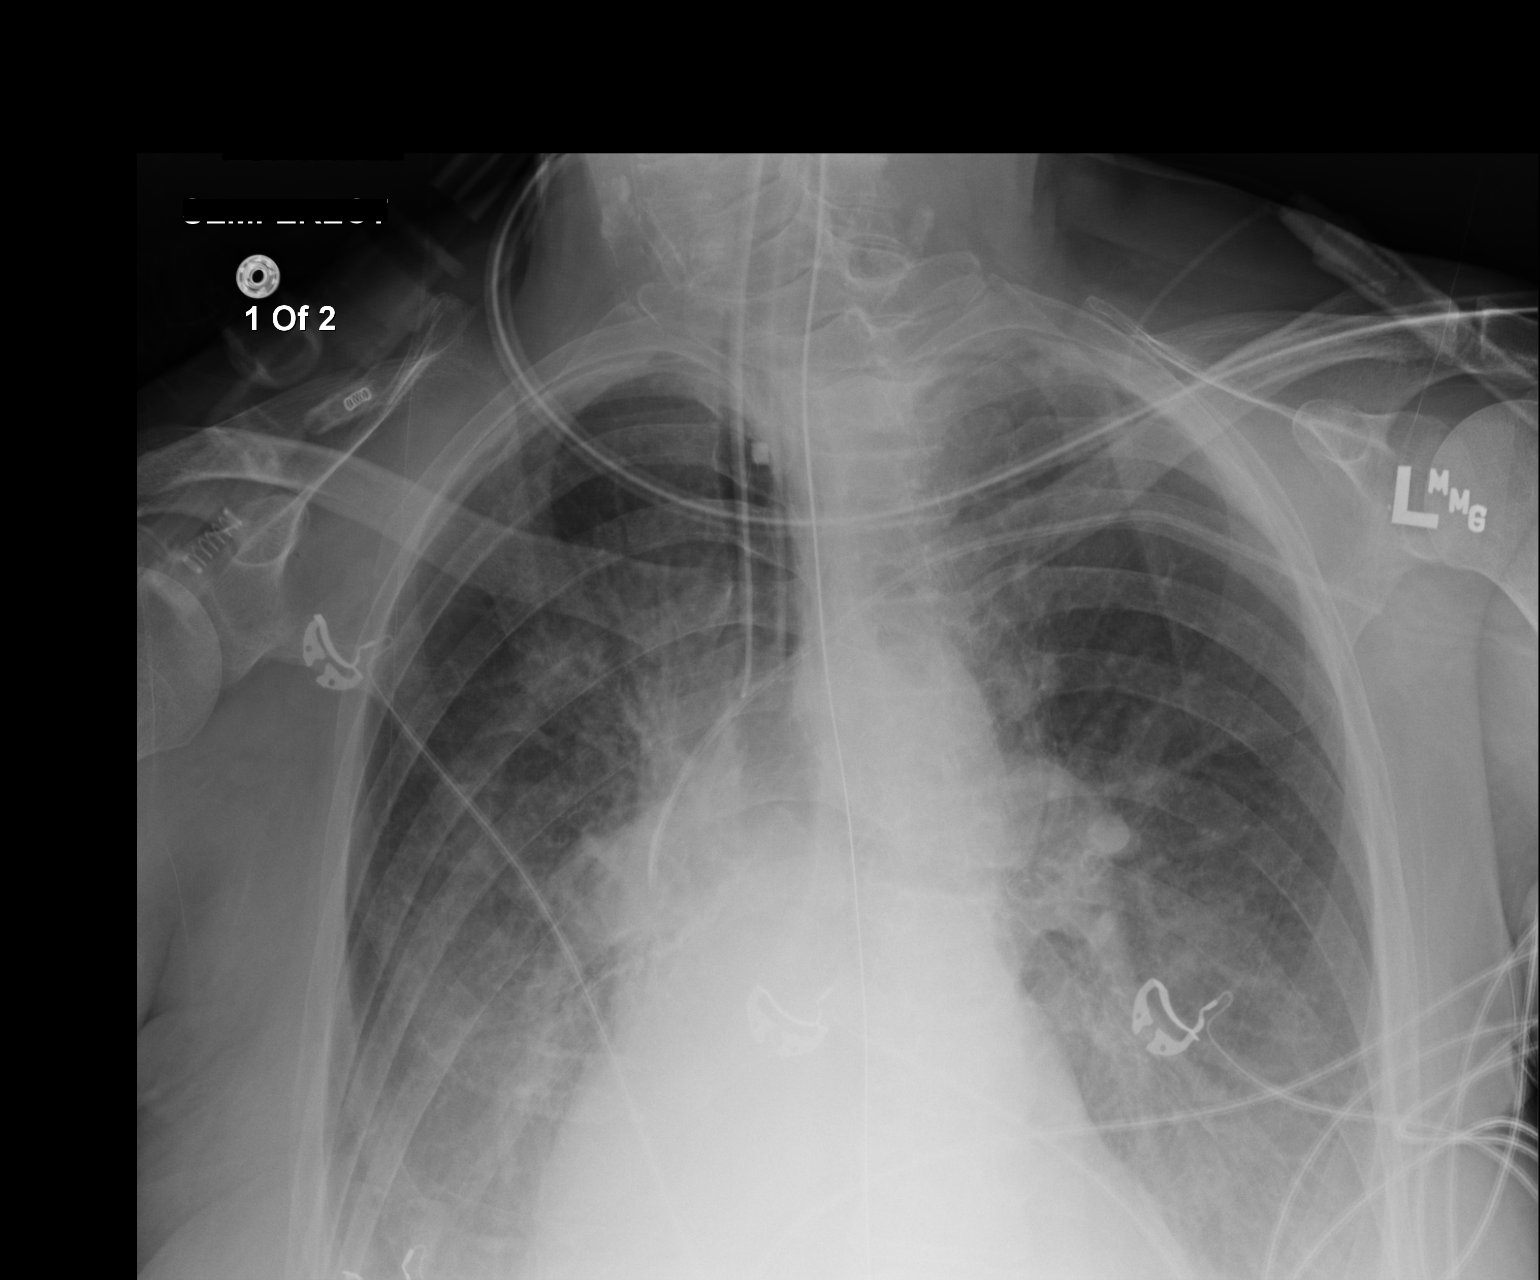

[AP (2 of 2)]
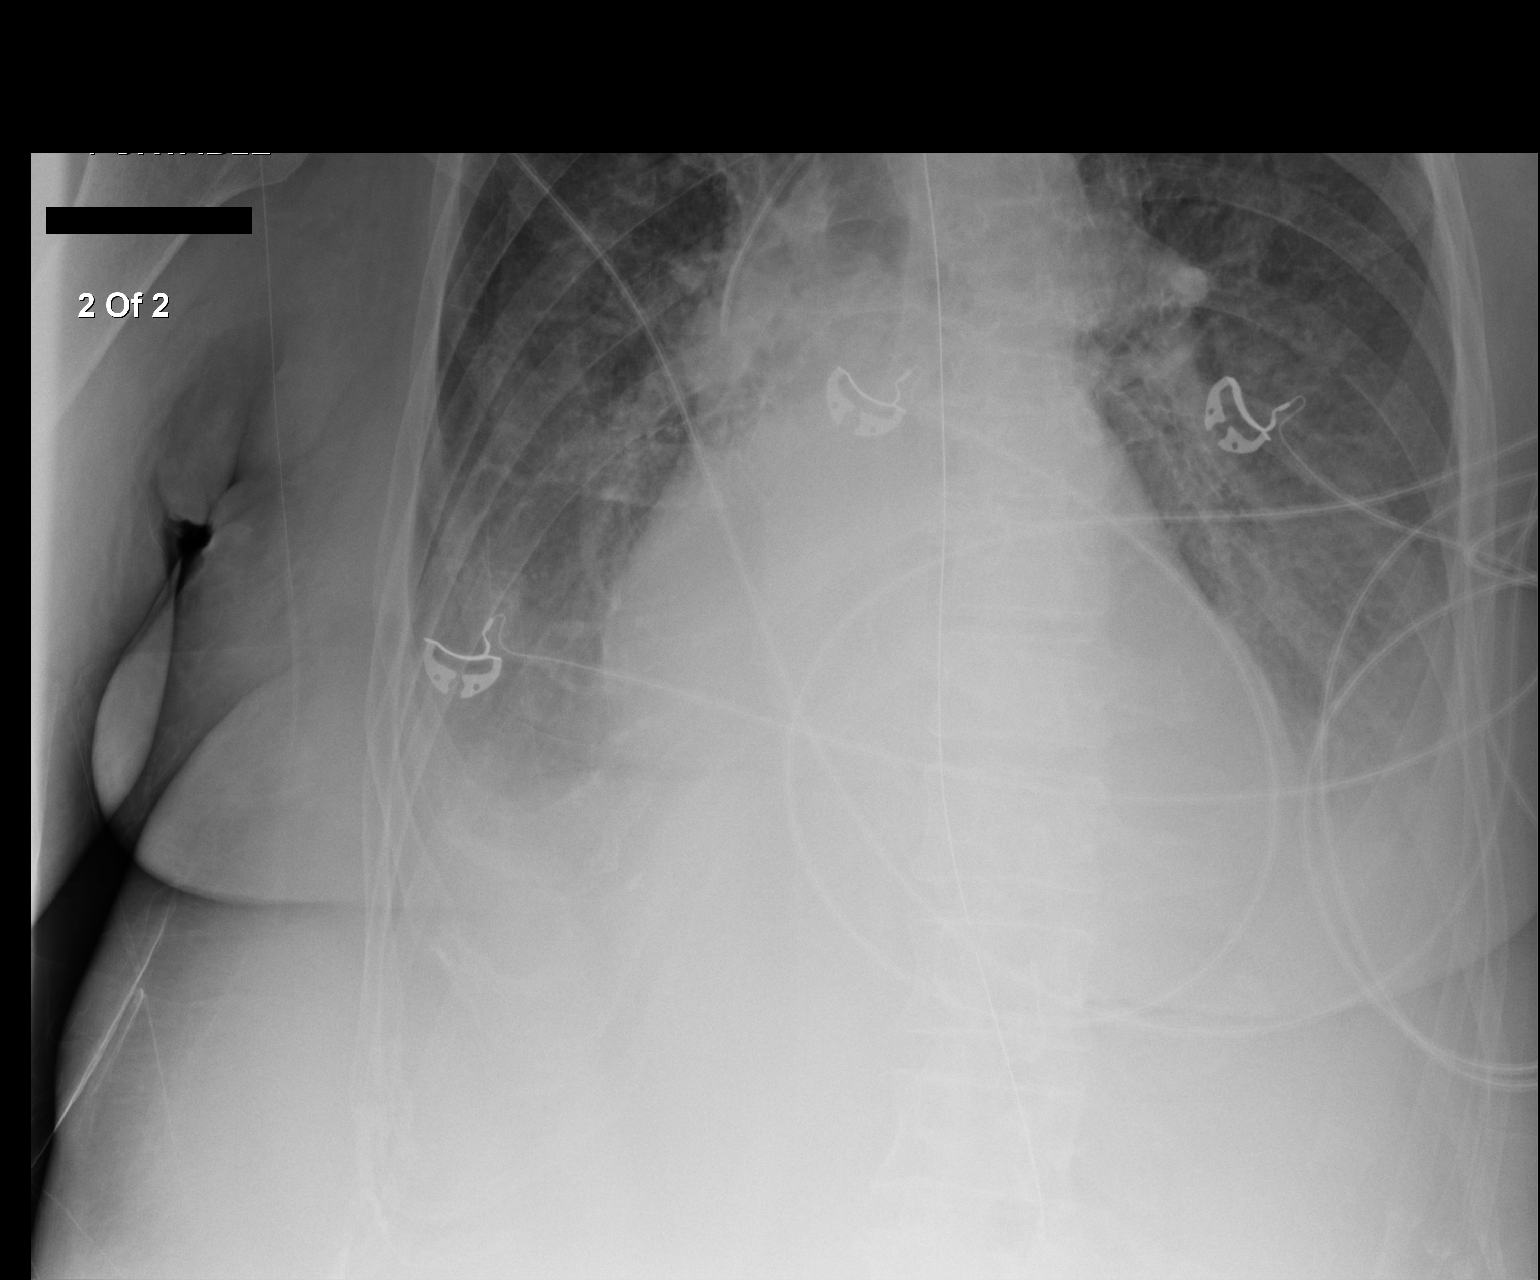

[2 of 2 positions shown; findings below may reference images not displayed]

FINDINGS: Endotracheal tube, NG tube, left subclavian line in stable position.
Cardiomegaly with bilateral pulmonary infiltrates and bilateral
pleural effusions consistent with CHF. Slight interim progression.
No pneumothorax.
IMPRESSION: 1.  Lines and tubes in stable position.

2. Congestive heart failure with bilateral pulmonary edema and
bilateral pleural effusions. Slight interim progression.
